# Patient Record
Sex: Female | Born: 1945 | Race: White | Hispanic: No | Marital: Married | Smoking: Former smoker
Health system: Southern US, Community
[De-identification: ages and names within clinical notes are randomized; demographics above are authoritative.]

## PROBLEM LIST (undated history)

## (undated) DIAGNOSIS — T8859XA Other complications of anesthesia, initial encounter: Secondary | ICD-10-CM

## (undated) DIAGNOSIS — F419 Anxiety disorder, unspecified: Secondary | ICD-10-CM

## (undated) DIAGNOSIS — M1990 Unspecified osteoarthritis, unspecified site: Secondary | ICD-10-CM

## (undated) DIAGNOSIS — G4700 Insomnia, unspecified: Secondary | ICD-10-CM

## (undated) DIAGNOSIS — R55 Syncope and collapse: Secondary | ICD-10-CM

## (undated) DIAGNOSIS — Z87442 Personal history of urinary calculi: Secondary | ICD-10-CM

## (undated) DIAGNOSIS — R011 Cardiac murmur, unspecified: Secondary | ICD-10-CM

## (undated) DIAGNOSIS — I4891 Unspecified atrial fibrillation: Secondary | ICD-10-CM

## (undated) DIAGNOSIS — G510 Bell's palsy: Secondary | ICD-10-CM

## (undated) DIAGNOSIS — T4145XA Adverse effect of unspecified anesthetic, initial encounter: Secondary | ICD-10-CM

## (undated) DIAGNOSIS — IMO0001 Reserved for inherently not codable concepts without codable children: Secondary | ICD-10-CM

## (undated) DIAGNOSIS — I10 Essential (primary) hypertension: Secondary | ICD-10-CM

## (undated) DIAGNOSIS — I422 Other hypertrophic cardiomyopathy: Secondary | ICD-10-CM

## (undated) DIAGNOSIS — K219 Gastro-esophageal reflux disease without esophagitis: Secondary | ICD-10-CM

## (undated) DIAGNOSIS — I739 Peripheral vascular disease, unspecified: Secondary | ICD-10-CM

## (undated) DIAGNOSIS — I700 Atherosclerosis of aorta: Secondary | ICD-10-CM

## (undated) DIAGNOSIS — M8580 Other specified disorders of bone density and structure, unspecified site: Secondary | ICD-10-CM

## (undated) HISTORY — DX: Unspecified atrial fibrillation: I48.91

## (undated) HISTORY — PX: TONSILLECTOMY: SUR1361

## (undated) HISTORY — DX: Reserved for inherently not codable concepts without codable children: IMO0001

## (undated) HISTORY — DX: Peripheral vascular disease, unspecified: I73.9

## (undated) HISTORY — DX: Atherosclerosis of aorta: I70.0

## (undated) HISTORY — PX: BACK SURGERY: SHX140

## (undated) HISTORY — DX: Insomnia, unspecified: G47.00

## (undated) HISTORY — DX: Syncope and collapse: R55

## (undated) HISTORY — DX: Other specified disorders of bone density and structure, unspecified site: M85.80

## (undated) HISTORY — PX: OVARIAN CYST REMOVAL: SHX89

## (undated) HISTORY — PX: DILATION AND CURETTAGE OF UTERUS: SHX78

## (undated) HISTORY — PX: AUGMENTATION MAMMAPLASTY: SUR837

---

## 1998-08-09 ENCOUNTER — Other Ambulatory Visit: Admission: RE | Admit: 1998-08-09 | Discharge: 1998-08-09 | Payer: Self-pay | Admitting: Gynecology

## 1999-08-28 ENCOUNTER — Other Ambulatory Visit: Admission: RE | Admit: 1999-08-28 | Discharge: 1999-08-28 | Payer: Self-pay | Admitting: Gynecology

## 2000-04-15 HISTORY — PX: FACIAL COSMETIC SURGERY: SHX629

## 2000-04-15 HISTORY — PX: INGUINAL HERNIA REPAIR: SUR1180

## 2000-08-27 ENCOUNTER — Other Ambulatory Visit: Admission: RE | Admit: 2000-08-27 | Discharge: 2000-08-27 | Payer: Self-pay | Admitting: Gynecology

## 2000-11-21 ENCOUNTER — Encounter: Payer: Self-pay | Admitting: Internal Medicine

## 2000-11-21 ENCOUNTER — Encounter: Admission: RE | Admit: 2000-11-21 | Discharge: 2000-11-21 | Payer: Self-pay | Admitting: Internal Medicine

## 2000-12-02 ENCOUNTER — Encounter: Admission: RE | Admit: 2000-12-02 | Discharge: 2000-12-02 | Payer: Self-pay | Admitting: Urology

## 2000-12-02 ENCOUNTER — Encounter: Payer: Self-pay | Admitting: Urology

## 2000-12-17 ENCOUNTER — Encounter: Admission: RE | Admit: 2000-12-17 | Discharge: 2000-12-17 | Payer: Self-pay | Admitting: General Surgery

## 2000-12-17 ENCOUNTER — Encounter: Payer: Self-pay | Admitting: General Surgery

## 2000-12-18 ENCOUNTER — Ambulatory Visit (HOSPITAL_BASED_OUTPATIENT_CLINIC_OR_DEPARTMENT_OTHER): Admission: RE | Admit: 2000-12-18 | Discharge: 2000-12-18 | Payer: Self-pay | Admitting: General Surgery

## 2001-09-10 ENCOUNTER — Other Ambulatory Visit: Admission: RE | Admit: 2001-09-10 | Discharge: 2001-09-10 | Payer: Self-pay | Admitting: Gynecology

## 2002-06-05 ENCOUNTER — Emergency Department (HOSPITAL_COMMUNITY): Admission: EM | Admit: 2002-06-05 | Discharge: 2002-06-05 | Payer: Self-pay | Admitting: Emergency Medicine

## 2002-12-15 ENCOUNTER — Other Ambulatory Visit: Admission: RE | Admit: 2002-12-15 | Discharge: 2002-12-15 | Payer: Self-pay | Admitting: Gynecology

## 2003-10-29 ENCOUNTER — Emergency Department (HOSPITAL_COMMUNITY): Admission: EM | Admit: 2003-10-29 | Discharge: 2003-10-29 | Payer: Self-pay | Admitting: Emergency Medicine

## 2007-08-13 ENCOUNTER — Other Ambulatory Visit: Admission: RE | Admit: 2007-08-13 | Discharge: 2007-08-13 | Payer: Self-pay | Admitting: Obstetrics and Gynecology

## 2009-10-30 ENCOUNTER — Other Ambulatory Visit: Admission: RE | Admit: 2009-10-30 | Discharge: 2009-10-30 | Payer: Self-pay | Admitting: Internal Medicine

## 2010-08-31 NOTE — Op Note (Signed)
[place]  Patient:    Beverly Li, Beverly Li Visit Number: [id number] MRN: [id number]          Service Type: DSU Location: [facility] Attending Physician:  Arlis Porta Dictated by:   Adolph Pollack, M.D. Admit Date:  12/18/2000   CC:         Beverly Hair. Vaughan Basta., M.D.   Operative Report  PREOPERATIVE DIAGNOSIS:  Bilateral inguinal hernias.  POSTOPERATIVE DIAGNOSIS:  Bilateral indirect inguinal hernias.  OPERATION PERFORMED:  Laparoscopic repair of bilateral indirect inguinal hernias with mesh (TAPP).  SURGEON:  Adolph Pollack, M.D.  ANESTHESIA:  General.  INDICATIONS FOR PROCEDURE:  Beverly Li is a 65 year old female who has been noticing growing bulges in her groin, left greater than right with some symptomatology.  These are especially noted when she does abdominal muscle exercises.  She now presents for elective repair of her bilateral inguinal hernias.  She has had a lower transverse incision in the past.  DESCRIPTION OF PROCEDURE:  She was placed supine on the operating table and general anesthetic was administered.  A Foley catheter was placed in the bladder.  Her abdomen was then sterilely prepped and draped.  A local anesthetic consisting of dilute Marcaine was infiltrated in the subumbilical region and a transverse incision made in the subumbilical region.  The midline was identified.  I went to the left of the midline to the anterior rectus sheath and made an incision in the anterior rectus sheath.  I attempted dissect the rectus muscle laterally and identify the posterior rectus sheath but it was fused to the peritoneum and I entered the peritoneal cavity.  I approached the right side in a similar fashion, made a small incision in the right anterior rectus sheath but again met with the same fusion which led me to believe that despite the lower transverse incision, a midline fascial incision had been made.  I  subsequently went ahead and inserted a trocar into the peritoneal cavity and a pneumoperitoneum was created by insufflation of CO2 gas.  I then placed her in the Trendelenburg position.  Two small incisions were made in the midline and 5 mm trocars placed through them.  I then noted the hernias and noted the peritoneal defects.  I subsequently dissected the peritoneum down and exposed the extraperitoneal space on the left side.  I also exposed Coopers ligament in this fashion.  I noted that this was an indirect hernia and I removed the sac.  I subsequently identified the round ligament and clipped it and divided it.  I then dissected the peritoneum free from the anterior and lateral abdominal walls out to the level of the anterior superior iliac spine.  Next, in a similar fashion, I approached the right side, dissected the peritoneum now free from what was an indirect hernia.  Coopers ligament had been exposed.  I subsequently identified the hernia sac and brought it back to the level of the anterior superior iliac spine.  I clipped the round ligament and divided it.  Next, I placed a piece of 4 inch x  6 inch mesh into the left inguinal space.  There was a filmy type device to be laid against the viscera if it were to contact it.  I then anchored the mesh inferomedially to the Coopers ligament, then laterally to the lateral abdominal wall the level of the anterior superior iliac spine, anteriorly to the anterior abdominal wall with spiral tacks.  This adequately covered  the direct, indirect and femoral spaces.  Next, I placed a piece of 4 x 6 inch mesh of the same type into the right inguinal area and anchored it inferomedially to Coopers ligament, laterally to the abdominal wall and anterior to the abdominal wall with the spiral tacks.  Likewise, this covered the direct, indirect and femoral spaces adequately.  I subsequently was able to reapproximate the peritoneum on the left side  by clipping it together.  No significant peritoneal defect was made on the right side, so the mesh essentially remained extraperitoneal without contact into any intraperitoneal contents.  Subsequently, I released the CO2 gas from the extra and intraperitoneal areas.  The trocars were removed.  I repaired both the right and left anterior rectus sheath defects with interrupted Vicryl sutures.  The skin incisions were then closed with 4-0 Monocryl subcuticular stitches followed by Steri-Strips and sterile dressings.  The patient tolerated the procedure well without any apparent complications and was taken to the recovery room in satisfactory condition. Dictated by:   Adolph Pollack, M.D. Attending Physician:  Arlis Porta DD:  12/18/00 TD:  12/18/00 Job: [id number]

## 2010-11-22 ENCOUNTER — Other Ambulatory Visit: Payer: Self-pay | Admitting: Internal Medicine

## 2010-11-22 ENCOUNTER — Ambulatory Visit
Admission: RE | Admit: 2010-11-22 | Discharge: 2010-11-22 | Disposition: A | Discharge status: Home / Self Care | Payer: 59 | Source: Ambulatory Visit | Attending: Internal Medicine | Admitting: Internal Medicine

## 2010-11-22 DIAGNOSIS — E041 Nontoxic single thyroid nodule: Secondary | ICD-10-CM

## 2010-11-23 ENCOUNTER — Other Ambulatory Visit: Payer: Self-pay

## 2010-11-28 ENCOUNTER — Other Ambulatory Visit: Payer: Self-pay | Admitting: Internal Medicine

## 2010-11-28 DIAGNOSIS — E041 Nontoxic single thyroid nodule: Secondary | ICD-10-CM

## 2010-12-12 ENCOUNTER — Ambulatory Visit
Admission: RE | Admit: 2010-12-12 | Discharge: 2010-12-12 | Disposition: A | Discharge status: Home / Self Care | Payer: 59 | Source: Ambulatory Visit | Attending: Internal Medicine | Admitting: Internal Medicine

## 2010-12-12 ENCOUNTER — Other Ambulatory Visit (HOSPITAL_COMMUNITY)
Admission: RE | Admit: 2010-12-12 | Discharge: 2010-12-12 | Disposition: A | Discharge status: Home / Self Care | Payer: 59 | Source: Ambulatory Visit | Attending: Interventional Radiology | Admitting: Interventional Radiology

## 2010-12-12 DIAGNOSIS — E049 Nontoxic goiter, unspecified: Secondary | ICD-10-CM | POA: Insufficient documentation

## 2010-12-12 DIAGNOSIS — E041 Nontoxic single thyroid nodule: Secondary | ICD-10-CM

## 2011-11-28 ENCOUNTER — Other Ambulatory Visit: Payer: Self-pay | Admitting: Internal Medicine

## 2011-11-28 ENCOUNTER — Other Ambulatory Visit (HOSPITAL_COMMUNITY)
Admission: RE | Admit: 2011-11-28 | Discharge: 2011-11-28 | Disposition: A | Discharge status: Home / Self Care | Payer: 59 | Source: Ambulatory Visit | Attending: Internal Medicine | Admitting: Internal Medicine

## 2011-11-28 DIAGNOSIS — Z01419 Encounter for gynecological examination (general) (routine) without abnormal findings: Secondary | ICD-10-CM | POA: Insufficient documentation

## 2012-09-19 ENCOUNTER — Emergency Department (HOSPITAL_COMMUNITY)
Admission: EM | Admit: 2012-09-19 | Discharge: 2012-09-20 | Disposition: A | Discharge status: Home / Self Care | Payer: 59 | Attending: Emergency Medicine | Admitting: Emergency Medicine

## 2012-09-19 ENCOUNTER — Emergency Department (HOSPITAL_COMMUNITY): Payer: 59

## 2012-09-19 ENCOUNTER — Encounter (HOSPITAL_COMMUNITY): Payer: Self-pay | Admitting: *Deleted

## 2012-09-19 DIAGNOSIS — F1010 Alcohol abuse, uncomplicated: Secondary | ICD-10-CM | POA: Insufficient documentation

## 2012-09-19 DIAGNOSIS — R1013 Epigastric pain: Secondary | ICD-10-CM | POA: Insufficient documentation

## 2012-09-19 DIAGNOSIS — R002 Palpitations: Secondary | ICD-10-CM | POA: Insufficient documentation

## 2012-09-19 DIAGNOSIS — R079 Chest pain, unspecified: Secondary | ICD-10-CM | POA: Insufficient documentation

## 2012-09-19 HISTORY — DX: Essential (primary) hypertension: I10

## 2012-09-19 LAB — BASIC METABOLIC PANEL
CO2: 30 mEq/L (ref 19–32)
Chloride: 104 mEq/L (ref 96–112)
GFR calc Af Amer: 79 mL/min — ABNORMAL LOW (ref >90)
Sodium: 142 mEq/L (ref 135–145)

## 2012-09-19 LAB — CBC
Platelets: 204 10*3/uL (ref 150–400)
RBC: 5.64 MIL/uL — ABNORMAL HIGH (ref 3.87–5.11)
RDW: 13.9 % (ref 11.5–15.5)
WBC: 15.4 10*3/uL — ABNORMAL HIGH (ref 4.0–10.5)

## 2012-09-19 LAB — POCT I-STAT TROPONIN I: Troponin i, poc: 0.01 ng/mL (ref 0.00–0.08)

## 2012-09-19 NOTE — ED Notes (Signed)
Pt states indigestion x 2 days. Was at party today and experienced, pounding pulse, nausea, vomiting diaphoresis, and burning CP.

## 2012-09-19 NOTE — ED Provider Notes (Signed)
History     CSN: [id number]  Arrival date & time 09/19/12  2225   First MD Initiated Contact with Patient 09/19/12 2313      Chief Complaint  Patient presents with  . Chest Pain    (Consider location/radiation/quality/duration/timing/severity/associated sxs/prior treatment) HPI Comments: Patient presents with "burning in her chest" that started earlier this evening.  She was at a party and had a few hors de huerves and one mixed drink prior to the onset of the symptoms.  She also reports having palpitations, pounding in her chest during the episode.  She also reports pain in her right arm that has occurred intermittently for the past two days.    Patient is a 67 y.o. female presenting with chest pain. The history is provided by the patient.  Chest Pain Pain location:  Epigastric and substernal area Pain quality: burning   Pain radiates to:  Does not radiate Pain radiates to the back: no   Pain severity:  Moderate Onset quality:  Gradual Timing:  Constant Progression:  Resolved Chronicity:  New Relieved by:  Nothing Worsened by:  Nothing tried Ineffective treatments:  None tried   No past medical history on file.  No past surgical history on file.  No family history on file.  History  Substance Use Topics  . Smoking status: Not on file  . Smokeless tobacco: Not on file  . Alcohol Use: Not on file    OB History   No data available      Review of Systems  Cardiovascular: Positive for chest pain.  All other systems reviewed and are negative.    Allergies  Review of patient's allergies indicates not on file.  Home Medications  No current outpatient prescriptions on file.  BP 164/86  Pulse 70  Temp(Src) 98.5 F (36.9 C) (Oral)  Resp 18  SpO2 98%  Physical Exam  Nursing note and vitals reviewed. Constitutional: She is oriented to person, place, and time. She appears well-developed and well-nourished. No distress.  HENT:  Head: Normocephalic and  atraumatic.  Mouth/Throat: Oropharynx is clear and moist.  Neck: Normal range of motion. Neck supple.  Cardiovascular: Normal rate, regular rhythm and normal heart sounds.   No murmur heard. Pulmonary/Chest: Effort normal and breath sounds normal. No respiratory distress. She has no wheezes. She has no rales.  Abdominal: Soft. Bowel sounds are normal. She exhibits no distension. There is no tenderness.  Musculoskeletal: Normal range of motion. She exhibits no edema.  Lymphadenopathy:    She has no cervical adenopathy.  Neurological: She is alert and oriented to person, place, and time.  Skin: Skin is warm and dry. She is not diaphoretic.    ED Course  Procedures (including critical care time)  Labs Reviewed  CBC - Abnormal; Notable for the following:    WBC 15.4 (*)    RBC 5.64 (*)    Hemoglobin 18.4 (*)    HCT 51.7 (*)    All other components within normal limits  BASIC METABOLIC PANEL - Abnormal; Notable for the following:    BUN 30 (*)    GFR calc non Af Amer 68 (*)    GFR calc Af Amer 79 (*)    All other components within normal limits  POCT I-STAT TROPONIN I   No results found.   No diagnosis found.   Date: 09/19/2012  Rate: 69  Rhythm: normal sinus rhythm  QRS Axis: left  Intervals: normal  ST/T Wave abnormalities: nonspecific T wave changes  Conduction Disutrbances:none  Narrative Interpretation:   Old EKG Reviewed: unchanged    MDM  The ekg shows ST segment inversions in the lateral leads that are concerning, but troponin is negative.  With no prior ekgs for comparison, I recommended admission for observation.  I have discussed this with the patient, however she does not want to stay.  She tells me she will call her cardiologist on Monday to arrange a follow up.  I have advised her of the risks of discharge including death and disability and she is willing to accept them.  She will return if her symptoms worsen or change.         Geoffery Lyons,  MD 09/20/12 [phone]

## 2012-09-20 NOTE — ED Notes (Signed)
Pt. Aware of risks and benefits of IV for cardiac care.

## 2012-09-20 NOTE — ED Notes (Signed)
2320: pt. Refused piv. Dr Judd Lien made aware.

## 2012-12-08 ENCOUNTER — Other Ambulatory Visit: Payer: Self-pay | Admitting: Family Medicine

## 2012-12-08 DIAGNOSIS — M545 Low back pain, unspecified: Secondary | ICD-10-CM

## 2012-12-17 ENCOUNTER — Ambulatory Visit
Admission: RE | Admit: 2012-12-17 | Discharge: 2012-12-17 | Disposition: A | Discharge status: Home / Self Care | Payer: 59 | Source: Ambulatory Visit | Attending: Family Medicine | Admitting: Family Medicine

## 2012-12-17 DIAGNOSIS — M545 Low back pain: Secondary | ICD-10-CM

## 2013-04-15 HISTORY — PX: POSTERIOR LUMBAR FUSION: SHX6036

## 2013-05-04 ENCOUNTER — Encounter: Payer: Self-pay | Admitting: Cardiology

## 2013-05-04 ENCOUNTER — Ambulatory Visit (INDEPENDENT_AMBULATORY_CARE_PROVIDER_SITE_OTHER): Payer: 59 | Admitting: Cardiology

## 2013-05-04 ENCOUNTER — Encounter (INDEPENDENT_AMBULATORY_CARE_PROVIDER_SITE_OTHER): Payer: Self-pay

## 2013-05-04 VITALS — BP 180/80 | HR 56 | Ht 65.0 in | Wt 127.0 lb

## 2013-05-04 DIAGNOSIS — I517 Cardiomegaly: Secondary | ICD-10-CM | POA: Insufficient documentation

## 2013-05-04 DIAGNOSIS — I700 Atherosclerosis of aorta: Secondary | ICD-10-CM

## 2013-05-04 DIAGNOSIS — I498 Other specified cardiac arrhythmias: Secondary | ICD-10-CM

## 2013-05-04 DIAGNOSIS — I10 Essential (primary) hypertension: Secondary | ICD-10-CM | POA: Insufficient documentation

## 2013-05-04 DIAGNOSIS — I499 Cardiac arrhythmia, unspecified: Secondary | ICD-10-CM

## 2013-05-04 DIAGNOSIS — I421 Obstructive hypertrophic cardiomyopathy: Secondary | ICD-10-CM

## 2013-05-04 DIAGNOSIS — R011 Cardiac murmur, unspecified: Secondary | ICD-10-CM | POA: Insufficient documentation

## 2013-05-04 DIAGNOSIS — R55 Syncope and collapse: Secondary | ICD-10-CM | POA: Insufficient documentation

## 2013-05-04 DIAGNOSIS — IMO0001 Reserved for inherently not codable concepts without codable children: Secondary | ICD-10-CM | POA: Insufficient documentation

## 2013-05-04 DIAGNOSIS — R9431 Abnormal electrocardiogram [ECG] [EKG]: Secondary | ICD-10-CM

## 2013-05-04 NOTE — Progress Notes (Signed)
[address] [place], [place]  [id number] Phone: [phone] Fax:  [phone]  Date:  05/04/2013   ID:  Beverly OrleansFrances B Li, DOB [birth year], MRN [id number]  PCP:  Beverley FiedlerANKINS,VICTORIA, MD   History of Present Illness: Beverly MeekerFrances B Li is a 68 y.o. female with hypertrophic cardiomyopathy. Previous event monitor on 09/22/12 demonstrated no ventricular tachycardia, no adverse arrhythmias. No SVT. In the past, she has had a stress test which was overall low risk with no arrhythmias. A prior monitor did show a brief run of NSVT in the past. Last monitor caused blistering.   On 09/19/12 she did go to the emergency room with chest burning. EKG was performed which showed ST segment changes especially in the lateral leads. An old one was not able to be compared in the emergency department but this is no change from prior. This was secondary to hypertrophic cardio myopathy. She did not wish to stay in the hospital for observation. Her troponin was negative. GERD. Nexium "has saved her life".   Having back pain. No exercise. Recent injection.   Wt Readings from Last 3 Encounters:  05/04/13 127 lb (57.607 kg)     Past Medical History  Diagnosis Date  . Atrial arrhythmia   . Hypertension   . Insomnia   . Systolic murmur   . Atrial septal hypertrophy     , proximal, without significant gradient  . Mild aortic sclerosis   . Osteopenia   . Syncope     , Vagal  . Diverticulosis     Past Surgical History  Procedure Laterality Date  . Hernia repair    . Breast enhancement surgery Bilateral   . Inguinal hernia repair Bilateral   . Facial cosmetic surgery    . Left ovarian cyst      Current Outpatient Prescriptions  Medication Sig Dispense Refill  . b complex vitamins tablet Take 1 tablet by mouth at bedtime.      . calcium carbonate (TUMS - DOSED IN MG ELEMENTAL CALCIUM) 500 MG chewable tablet Chew 1 tablet by mouth as needed for heartburn.       . esomeprazole (NEXIUM) 40 MG  capsule Take 40 mg by mouth daily at 12 noon.      [name]. losartan (COZAAR) 50 MG tablet Take 50 mg by mouth daily.       . Multiple Vitamin (MULTIVITAMIN WITH MINERALS) TABS Take 1 tablet by mouth at bedtime.      . nabumetone (RELAFEN) 500 MG tablet Take 500 mg by mouth 2 (two) times daily.      . nebivolol (BYSTOLIC) 10 MG tablet Take 10 mg by mouth at bedtime.      . traMADol (ULTRAM) 50 MG tablet Take 50 mg by mouth every 6 (six) hours as needed.      . zolpidem (AMBIEN) 10 MG tablet Take 10 mg by mouth at bedtime as needed for sleep.       No current facility-administered medications for this visit.    Allergies:    Allergies  Allergen Reactions  . Atenolol Cough  . Sulfa Antibiotics Nausea Only  . Codeine Rash    Social History:  The patient  reports that she quit smoking about 2 weeks ago. She does not have any smokeless tobacco history on file. She reports that she drinks alcohol. She reports that she does not use illicit drugs.   ROS:  Please see the history of present illness.  Denies any fevers, chills, orthopnea, PND    PHYSICAL EXAM: VS:  BP 180/80  Pulse 56  Ht 5\' 5"  (1.651 m)  Wt 127 lb (57.607 kg)  BMI 21.13 kg/m2 Well nourished, well developed, in no acute distress HEENT: normal Neck: no JVD Cardiac:  normal S1, S2; RRR; 2/6 systolic murmur Lungs:  clear to auscultation bilaterally, no wheezing, rhonchi or rales Abd: soft, nontender, no hepatomegaly Ext: no edema Skin: warm and dry Neuro: no focal abnormalities noted  EKG:  05/04/13: Sinus bradycardia rate 56 with LVH pattern, repolarization abnormality, T wave inversion in lateral leads.    No change from EKG on 7/14. ASSESSMENT AND PLAN:  1. Hypertension-elevated today. Just started back on losartan 3 days ago. She has been increases to 100. She will be seeing Dr. Barbaraann Barthel very soon. Important to continue to treat this aggressively. 2. Hypertrophic cardiomyopathy-continue with beta blocker. Murmur unchanged.  No high-risk symptoms such as syncope. Previous monitor with no ventricular tachycardia. EKG unchanged. EKG is abnormal because of hypertrophic pattern. 3. Abnormal EKG-secondary to hypertrophic cardio myopathy. 4. Chest pain-secondary to GERD. Nexium has helped significantly.  Signed, Donato Schultz, MD [facility]  05/04/2013 4:31 PM

## 2013-05-04 NOTE — Patient Instructions (Signed)
Your physician recommends that you continue on your current medications as directed. Please refer to the Current Medication list given to you today.  Your physician wants you to follow-up in: 6 months with Dr. Skains. You will receive a reminder letter in the mail two months in advance. If you don't receive a letter, please call our office to schedule the follow-up appointment.  

## 2013-05-19 ENCOUNTER — Telehealth: Payer: Self-pay | Admitting: Cardiology

## 2013-05-19 NOTE — Telephone Encounter (Signed)
New message     Dr Luciana Axeankin want to talk to Dr Caro HightSkain whenever he returns to the office

## 2013-05-21 NOTE — Telephone Encounter (Signed)
Spoke with Dr. Barbaraann Barthelankins 05/20/13. We decided to amlodipine 5 mg to her regimen. 10 mg of still blood pressures not under control. Next up would be to start spironolactone 25 mg once a day. We could consider renal duplex in future.

## 2013-05-21 NOTE — Telephone Encounter (Signed)
Dr. Barbaraann Barthelankins would like to talk with you regarding patient.

## 2013-05-25 NOTE — Telephone Encounter (Signed)
Medication change ,added Amlodipine

## 2013-06-21 ENCOUNTER — Other Ambulatory Visit: Payer: Self-pay | Admitting: Neurosurgery

## 2013-06-23 ENCOUNTER — Encounter (HOSPITAL_COMMUNITY): Payer: Self-pay

## 2013-06-29 NOTE — Pre-Procedure Instructions (Addendum)
GAVRIELLE STRECK  06/29/2013   Your procedure is scheduled on:  07/07/13  Report to [facility] short stay admitting at 630 AM.  Call this number if you have problems the morning of surgery: [phone]   Remember:   Do not eat food or drink liquids after midnight.   Take these medicines the morning of surgery with A SIP OF WATER: amlodipine,nexium, pain med if needed          STOP all herbel meds, nsaids (aleve,naproxen,advil,ibuprofen) 5 days prior to surgery including vitamins, aspirin   Do not wear jewelry, make-up or nail polish.  Do not wear lotions, powders, or perfumes. You may wear deodorant.  Do not shave 48 hours prior to surgery. Men may shave face and neck.  Do not bring valuables to the hospital.  [facility] is not responsible                  for any belongings or valuables.               Contacts, dentures or bridgework may not be worn into surgery.  Leave suitcase in the car. After surgery it may be brought to your room.  For patients admitted to the hospital, discharge time is determined by your                treatment team.               Patients discharged the day of surgery will not be allowed to drive  home.  Name and phone number of your driver:   Special Instructions:  Special Instructions: [facility] - Preparing for Surgery  Before surgery, you can play an important role.  Because skin is not sterile, your skin needs to be as free of germs as possible.  You can reduce the number of germs on you skin by washing with CHG (chlorahexidine gluconate) soap before surgery.  CHG is an antiseptic cleaner which kills germs and bonds with the skin to continue killing germs even after washing.  Please DO NOT use if you have an allergy to CHG or antibacterial soaps.  If your skin becomes reddened/irritated stop using the CHG and inform your nurse when you arrive at Short Stay.  Do not shave (including legs and underarms) for at least 48 hours prior to the first CHG shower.   You may shave your face.  Please follow these instructions carefully:   1.  Shower with CHG Soap the night before surgery and the morning of Surgery.  2.  If you choose to wash your hair, wash your hair first as usual with your normal shampoo.  3.  After you shampoo, rinse your hair and body thoroughly to remove the Shampoo.  4.  Use CHG as you would any other liquid soap.  You can apply chg directly  to the skin and wash gently with scrungie or a clean washcloth.  5.  Apply the CHG Soap to your body ONLY FROM THE NECK DOWN.  Do not use on open wounds or open sores.  Avoid contact with your eyes ears, mouth and genitals (private parts).  Wash genitals (private parts)       with your normal soap.  6.  Wash thoroughly, paying special attention to the area where your surgery will be performed.  7.  Thoroughly rinse your body with warm water from the neck down.  8.  DO NOT shower/wash with your normal soap after using and rinsing  off the CHG Soap.  9.  Pat yourself dry with a clean towel.            10.  Wear clean pajamas.            11.  Place clean sheets on your bed the night of your first shower and do not sleep with pets.  Day of Surgery  Do not apply any lotions/deodorants the morning of surgery.  Please wear clean clothes to the hospital/surgery center.   Please read over the following fact sheets that you were given: Pain Booklet, Coughing and Deep Breathing, Blood Transfusion Information, MRSA Information and Surgical Site Infection Prevention

## 2013-06-30 ENCOUNTER — Encounter (HOSPITAL_COMMUNITY): Payer: Self-pay

## 2013-06-30 ENCOUNTER — Encounter (HOSPITAL_COMMUNITY)
Admission: RE | Admit: 2013-06-30 | Discharge: 2013-06-30 | Disposition: A | Discharge status: Home / Self Care | Payer: 59 | Source: Ambulatory Visit | Attending: Neurosurgery | Admitting: Neurosurgery

## 2013-06-30 DIAGNOSIS — Z01812 Encounter for preprocedural laboratory examination: Secondary | ICD-10-CM | POA: Insufficient documentation

## 2013-06-30 HISTORY — DX: Personal history of urinary calculi: Z87.442

## 2013-06-30 HISTORY — DX: Gastro-esophageal reflux disease without esophagitis: K21.9

## 2013-06-30 HISTORY — DX: Other hypertrophic cardiomyopathy: I42.2

## 2013-06-30 LAB — SURGICAL PCR SCREEN
MRSA, PCR: NEGATIVE
STAPHYLOCOCCUS AUREUS: NEGATIVE

## 2013-06-30 LAB — BASIC METABOLIC PANEL
BUN: 30 mg/dL — ABNORMAL HIGH (ref 6–23)
CALCIUM: 9.9 mg/dL (ref 8.4–10.5)
CO2: 27 mEq/L (ref 19–32)
Chloride: 103 mEq/L (ref 96–112)
Creatinine, Ser: 0.68 mg/dL (ref 0.50–1.10)
GFR calc Af Amer: >90 mL/min (ref >90)
GFR calc non Af Amer: 88 mL/min — ABNORMAL LOW (ref >90)
GLUCOSE: 99 mg/dL (ref 70–99)
Potassium: 4.1 mEq/L (ref 3.7–5.3)
SODIUM: 142 meq/L (ref 137–147)

## 2013-06-30 LAB — CBC
HCT: 49.8 % — ABNORMAL HIGH (ref 36.0–46.0)
Hemoglobin: 17.5 g/dL — ABNORMAL HIGH (ref 12.0–15.0)
MCH: 32.1 pg (ref 26.0–34.0)
MCHC: 35.1 g/dL (ref 30.0–36.0)
MCV: 91.4 fL (ref 78.0–100.0)
Platelets: 229 10*3/uL (ref 150–400)
RBC: 5.45 MIL/uL — AB (ref 3.87–5.11)
RDW: 13.3 % (ref 11.5–15.5)
WBC: 8.9 10*3/uL (ref 4.0–10.5)

## 2013-06-30 LAB — TYPE AND SCREEN
ABO/RH(D): A NEG
Antibody Screen: NEGATIVE

## 2013-06-30 LAB — ABO/RH: ABO/RH(D): A NEG

## 2013-06-30 NOTE — Progress Notes (Addendum)
req'd clearance note from dr Anne Fuskains for surgery

## 2013-07-01 ENCOUNTER — Encounter (HOSPITAL_COMMUNITY): Payer: Self-pay

## 2013-07-01 NOTE — Progress Notes (Signed)
Anesthesia Chart Review:  Patient is a 68 year old female scheduled for L4, L5 laminectomies adn facetectomies with L4-5 PLIF on 07/07/2013 by Dr. Newell CoralNudelman.  History includes former smoker, hypertrophic cardiomyopathy, atrial septal hypertrophy, mild aortic sclerosis '06, dysrhythmia (51 PACs, 4 beat PAT, 15 PVCs, 5 beast NSVT, 1 triplet by 48 hour Holter monitor 08/2011), vagal syncope, hypertension, GERD, nephrolithiasis, diverticulosis, insomnia, osteopenia, breast augmentation, facial cosmetic surgery, tonsillectomy, bilateral inguinal hernia repair. PCP is listed as Dr. Beverley FiedlerVictoria Rankins. Cardiologist is Dr. Donato SchultzMark Skains, last visit 05/04/13. BP was 143/85 at PAT.  Meds: MVI, amlodipine, esomeprazole, losartan, nebivolol, tramadol, zolpidem.  EKG on 05/04/13 showed SB @ 56 bpm, biatrial enlargement, inferior infarct (age undetermined), anterior infarct (age undetermined), ST/T wave abnormality, consider lateral ischemia. EKG appears stable when compared to 11/22/10 EKG.  Holter monitor from 09/22/12 - 10/13/12 showed no adverse arrhythmias.  Echo on 05/15/10 showed: Severe left ventricular basilar hypertrophy with no evidence of significant outflow tract obstruction. There were no regional wall motion abnormalities. LVEF estimated at 65-70%. Findings consistent with hypertrophic cardiomyopathy. Mild left atrial enlargement. No systolic anterior motion of the mitral valve.  Mild tricuspid regurgitation. Analysis of mitral valve inflow, pulmonary vein Doppler and tissue Doppler suggests grade 1a diastolic dysfunction with elevated left atrial pressure. No AR or AS. Mild TR. Trace PR.  Nuclear stress test on 05/31/2010 showed low risk nuclear stress test. Hypertrophic cardiomyopathy, no adverse arrhythmias during exercise, normal ejection fraction of 62%, no evidence of ischemia.  CXR on 09/19/12 showed: No evidence of acute cardiopulmonary disease. Mild cardiomegaly and COPD/emphysema.  Preoperative labs  noted.  Chart reviewed with anesthesiologist Dr. Jean RosenthalJackson. Patient has had recent cardiology follow-up and had no significant outflow tract obstruction on her last echo.  If no acute changes then it is anticipated that she can proceed as planned.  Velna Ochsllison Charletta Voight, PA-C [facility] Phone [phone] 07/01/2013 4:49 PM

## 2013-07-06 MED ORDER — CEFAZOLIN SODIUM-DEXTROSE 2-3 GM-% IV SOLR
2.0000 g | INTRAVENOUS | Status: AC
  Administered 2013-07-07: 2 g via INTRAVENOUS
  Filled 2013-07-06: qty 50

## 2013-07-07 ENCOUNTER — Encounter (HOSPITAL_COMMUNITY)
Admission: RE | Disposition: A | Discharge status: Home / Self Care | Payer: 59 | Source: Ambulatory Visit | Attending: Neurosurgery

## 2013-07-07 ENCOUNTER — Encounter (HOSPITAL_COMMUNITY): Payer: 59 | Admitting: Vascular Surgery

## 2013-07-07 ENCOUNTER — Encounter (HOSPITAL_COMMUNITY): Payer: Self-pay | Admitting: *Deleted

## 2013-07-07 ENCOUNTER — Inpatient Hospital Stay (HOSPITAL_COMMUNITY): Payer: 59 | Admitting: Certified Registered"

## 2013-07-07 ENCOUNTER — Inpatient Hospital Stay (HOSPITAL_COMMUNITY): Payer: 59

## 2013-07-07 ENCOUNTER — Inpatient Hospital Stay (HOSPITAL_COMMUNITY)
Admission: RE | Admit: 2013-07-07 | Discharge: 2013-07-08 | DRG: 460 | Disposition: A | Discharge status: Home / Self Care | Payer: 59 | Source: Ambulatory Visit | Attending: Neurosurgery | Admitting: Neurosurgery

## 2013-07-07 DIAGNOSIS — I422 Other hypertrophic cardiomyopathy: Secondary | ICD-10-CM | POA: Diagnosis present

## 2013-07-07 DIAGNOSIS — I10 Essential (primary) hypertension: Secondary | ICD-10-CM | POA: Diagnosis present

## 2013-07-07 DIAGNOSIS — Z885 Allergy status to narcotic agent status: Secondary | ICD-10-CM

## 2013-07-07 DIAGNOSIS — Z79899 Other long term (current) drug therapy: Secondary | ICD-10-CM

## 2013-07-07 DIAGNOSIS — M48062 Spinal stenosis, lumbar region with neurogenic claudication: Principal | ICD-10-CM | POA: Diagnosis present

## 2013-07-07 DIAGNOSIS — R011 Cardiac murmur, unspecified: Secondary | ICD-10-CM | POA: Diagnosis present

## 2013-07-07 DIAGNOSIS — M4310 Spondylolisthesis, site unspecified: Secondary | ICD-10-CM | POA: Diagnosis present

## 2013-07-07 DIAGNOSIS — K219 Gastro-esophageal reflux disease without esophagitis: Secondary | ICD-10-CM | POA: Diagnosis present

## 2013-07-07 DIAGNOSIS — I700 Atherosclerosis of aorta: Secondary | ICD-10-CM | POA: Diagnosis present

## 2013-07-07 DIAGNOSIS — Z801 Family history of malignant neoplasm of trachea, bronchus and lung: Secondary | ICD-10-CM

## 2013-07-07 DIAGNOSIS — Z800 Family history of malignant neoplasm of digestive organs: Secondary | ICD-10-CM

## 2013-07-07 DIAGNOSIS — Z803 Family history of malignant neoplasm of breast: Secondary | ICD-10-CM

## 2013-07-07 DIAGNOSIS — Z87891 Personal history of nicotine dependence: Secondary | ICD-10-CM

## 2013-07-07 DIAGNOSIS — Z882 Allergy status to sulfonamides status: Secondary | ICD-10-CM

## 2013-07-07 SURGERY — POSTERIOR LUMBAR FUSION 1 LEVEL
Anesthesia: General | Site: Back | Laterality: Bilateral

## 2013-07-07 MED ORDER — DEXTROSE 5 % IV SOLN
10.0000 mg | INTRAVENOUS | Status: DC | PRN
  Administered 2013-07-07: 50 ug/min via INTRAVENOUS

## 2013-07-07 MED ORDER — PHENYLEPHRINE HCL 10 MG/ML IJ SOLN
INTRAMUSCULAR | Status: DC | PRN
  Administered 2013-07-07 (×3): 80 ug via INTRAVENOUS

## 2013-07-07 MED ORDER — LIDOCAINE HCL (CARDIAC) 20 MG/ML IV SOLN
INTRAVENOUS | Status: AC
Start: 2013-07-07 — End: 2013-07-07
  Filled 2013-07-07: qty 5

## 2013-07-07 MED ORDER — MORPHINE SULFATE 4 MG/ML IJ SOLN: 4.0000 mg | INTRAMUSCULAR | Status: DC | PRN

## 2013-07-07 MED ORDER — ROCURONIUM BROMIDE 50 MG/5ML IV SOLN
INTRAVENOUS | Status: AC
  Filled 2013-07-07: qty 1

## 2013-07-07 MED ORDER — MENTHOL 3 MG MT LOZG: 1.0000 | LOZENGE | OROMUCOSAL | Status: DC | PRN

## 2013-07-07 MED ORDER — NEBIVOLOL HCL 10 MG PO TABS
10.0000 mg | ORAL_TABLET | Freq: Every day | ORAL | Status: DC
  Administered 2013-07-07: 10 mg via ORAL
  Filled 2013-07-07 (×2): qty 1

## 2013-07-07 MED ORDER — HYDROXYZINE HCL 25 MG PO TABS: 50.0000 mg | ORAL_TABLET | ORAL | Status: DC | PRN

## 2013-07-07 MED ORDER — MAGNESIUM HYDROXIDE 400 MG/5ML PO SUSP: 30.0000 mL | Freq: Every day | ORAL | Status: DC | PRN

## 2013-07-07 MED ORDER — DEXAMETHASONE SODIUM PHOSPHATE 10 MG/ML IJ SOLN
INTRAMUSCULAR | Status: AC
  Filled 2013-07-07: qty 1

## 2013-07-07 MED ORDER — ONDANSETRON HCL 4 MG/2ML IJ SOLN: 4.0000 mg | Freq: Four times a day (QID) | INTRAMUSCULAR | Status: DC | PRN

## 2013-07-07 MED ORDER — FENTANYL CITRATE 0.05 MG/ML IJ SOLN
INTRAMUSCULAR | Status: AC
  Filled 2013-07-07: qty 5

## 2013-07-07 MED ORDER — BISACODYL 10 MG RE SUPP: 10.0000 mg | Freq: Every day | RECTAL | Status: DC | PRN

## 2013-07-07 MED ORDER — SODIUM CHLORIDE 0.9 % IJ SOLN: 3.0000 mL | INTRAMUSCULAR | Status: DC | PRN

## 2013-07-07 MED ORDER — ACETAMINOPHEN 10 MG/ML IV SOLN
INTRAVENOUS | Status: AC
  Filled 2013-07-07: qty 100

## 2013-07-07 MED ORDER — LACTATED RINGERS IV SOLN
INTRAVENOUS | Status: DC | PRN
  Administered 2013-07-07 (×3): via INTRAVENOUS

## 2013-07-07 MED ORDER — 0.9 % SODIUM CHLORIDE (POUR BTL) OPTIME
TOPICAL | Status: DC | PRN
  Administered 2013-07-07 (×2): 1000 mL

## 2013-07-07 MED ORDER — GLYCOPYRROLATE 0.2 MG/ML IJ SOLN
INTRAMUSCULAR | Status: DC | PRN
  Administered 2013-07-07: 0.2 mg via INTRAVENOUS
  Administered 2013-07-07: 0.4 mg via INTRAVENOUS

## 2013-07-07 MED ORDER — PROPOFOL 10 MG/ML IV BOLUS
INTRAVENOUS | Status: AC
  Filled 2013-07-07: qty 20

## 2013-07-07 MED ORDER — ACETAMINOPHEN 650 MG RE SUPP: 650.0000 mg | RECTAL | Status: DC | PRN

## 2013-07-07 MED ORDER — KCL IN DEXTROSE-NACL 20-5-0.45 MEQ/L-%-% IV SOLN
INTRAVENOUS | Status: DC
  Filled 2013-07-07 (×4): qty 1000

## 2013-07-07 MED ORDER — TRAMADOL HCL 50 MG PO TABS: 50.0000 mg | ORAL_TABLET | Freq: Four times a day (QID) | ORAL | Status: DC | PRN

## 2013-07-07 MED ORDER — THROMBIN 20000 UNITS EX SOLR
CUTANEOUS | Status: DC | PRN
  Administered 2013-07-07: 09:00:00 via TOPICAL

## 2013-07-07 MED ORDER — ONDANSETRON HCL 4 MG/2ML IJ SOLN
INTRAMUSCULAR | Status: DC | PRN
  Administered 2013-07-07: 4 mg via INTRAVENOUS

## 2013-07-07 MED ORDER — ACETAMINOPHEN 10 MG/ML IV SOLN
INTRAVENOUS | Status: DC | PRN
  Administered 2013-07-07: 1000 mg via INTRAVENOUS

## 2013-07-07 MED ORDER — MIDAZOLAM HCL 2 MG/2ML IJ SOLN
INTRAMUSCULAR | Status: AC
  Filled 2013-07-07: qty 2

## 2013-07-07 MED ORDER — BUPIVACAINE HCL (PF) 0.5 % IJ SOLN
INTRAMUSCULAR | Status: DC | PRN
  Administered 2013-07-07: 20 mL

## 2013-07-07 MED ORDER — AMLODIPINE BESYLATE 5 MG PO TABS
5.0000 mg | ORAL_TABLET | Freq: Every day | ORAL | Status: DC
  Filled 2013-07-07 (×2): qty 1

## 2013-07-07 MED ORDER — ALBUMIN HUMAN 5 % IV SOLN
INTRAVENOUS | Status: DC | PRN
  Administered 2013-07-07 (×2): via INTRAVENOUS

## 2013-07-07 MED ORDER — ZOLPIDEM TARTRATE 5 MG PO TABS: 5.0000 mg | ORAL_TABLET | Freq: Every evening | ORAL | Status: DC | PRN

## 2013-07-07 MED ORDER — LIDOCAINE HCL (CARDIAC) 20 MG/ML IV SOLN
INTRAVENOUS | Status: DC | PRN
  Administered 2013-07-07: 30 mg via INTRAVENOUS

## 2013-07-07 MED ORDER — ALUM & MAG HYDROXIDE-SIMETH 200-200-20 MG/5ML PO SUSP: 30.0000 mL | Freq: Four times a day (QID) | ORAL | Status: DC | PRN

## 2013-07-07 MED ORDER — SODIUM CHLORIDE 0.9 % IV SOLN: 250.0000 mL | INTRAVENOUS | Status: DC

## 2013-07-07 MED ORDER — MEPERIDINE HCL 25 MG/ML IJ SOLN: 6.2500 mg | INTRAMUSCULAR | Status: DC | PRN

## 2013-07-07 MED ORDER — KETOROLAC TROMETHAMINE 30 MG/ML IJ SOLN
30.0000 mg | Freq: Four times a day (QID) | INTRAMUSCULAR | Status: DC
  Administered 2013-07-07 – 2013-07-08 (×4): 30 mg via INTRAVENOUS
  Filled 2013-07-07 (×7): qty 1

## 2013-07-07 MED ORDER — NEOSTIGMINE METHYLSULFATE 1 MG/ML IJ SOLN
INTRAMUSCULAR | Status: DC | PRN
  Administered 2013-07-07: 3 mg via INTRAVENOUS

## 2013-07-07 MED ORDER — SODIUM CHLORIDE 0.9 % IR SOLN
Status: DC | PRN
  Administered 2013-07-07: 09:00:00

## 2013-07-07 MED ORDER — PANTOPRAZOLE SODIUM 40 MG PO TBEC
80.0000 mg | DELAYED_RELEASE_TABLET | Freq: Every day | ORAL | Status: DC
  Administered 2013-07-08: 80 mg via ORAL
  Filled 2013-07-07 (×2): qty 2

## 2013-07-07 MED ORDER — CYCLOBENZAPRINE HCL 10 MG PO TABS
10.0000 mg | ORAL_TABLET | Freq: Three times a day (TID) | ORAL | Status: DC | PRN
  Administered 2013-07-07 – 2013-07-08 (×2): 10 mg via ORAL
  Filled 2013-07-07 (×2): qty 1

## 2013-07-07 MED ORDER — KETOROLAC TROMETHAMINE 30 MG/ML IJ SOLN
30.0000 mg | Freq: Once | INTRAMUSCULAR | Status: AC
  Administered 2013-07-07: 30 mg via INTRAVENOUS

## 2013-07-07 MED ORDER — HYDROMORPHONE HCL PF 1 MG/ML IJ SOLN: 0.2500 mg | INTRAMUSCULAR | Status: DC | PRN

## 2013-07-07 MED ORDER — HYDROCODONE-ACETAMINOPHEN 5-325 MG PO TABS: 1.0000 | ORAL_TABLET | ORAL | Status: DC | PRN

## 2013-07-07 MED ORDER — MIDAZOLAM HCL 5 MG/5ML IJ SOLN
INTRAMUSCULAR | Status: DC | PRN
  Administered 2013-07-07: 2 mg via INTRAVENOUS

## 2013-07-07 MED ORDER — THROMBIN 5000 UNITS EX SOLR
OROMUCOSAL | Status: DC | PRN
  Administered 2013-07-07: 13:00:00 via TOPICAL

## 2013-07-07 MED ORDER — SODIUM CHLORIDE 0.9 % IJ SOLN
3.0000 mL | Freq: Two times a day (BID) | INTRAMUSCULAR | Status: DC
  Administered 2013-07-07: 3 mL via INTRAVENOUS

## 2013-07-07 MED ORDER — KETOROLAC TROMETHAMINE 30 MG/ML IJ SOLN
INTRAMUSCULAR | Status: AC
  Filled 2013-07-07: qty 1

## 2013-07-07 MED ORDER — PHENOL 1.4 % MT LIQD: 1.0000 | OROMUCOSAL | Status: DC | PRN

## 2013-07-07 MED ORDER — MIDAZOLAM HCL 2 MG/2ML IJ SOLN: 0.5000 mg | Freq: Once | INTRAMUSCULAR | Status: DC | PRN

## 2013-07-07 MED ORDER — ACETAMINOPHEN 325 MG PO TABS: 650.0000 mg | ORAL_TABLET | ORAL | Status: DC | PRN

## 2013-07-07 MED ORDER — OXYCODONE HCL 5 MG/5ML PO SOLN: 5.0000 mg | Freq: Once | ORAL | Status: AC | PRN

## 2013-07-07 MED ORDER — FENTANYL CITRATE 0.05 MG/ML IJ SOLN
INTRAMUSCULAR | Status: DC | PRN
  Administered 2013-07-07: 50 ug via INTRAVENOUS
  Administered 2013-07-07 (×2): 100 ug via INTRAVENOUS
  Administered 2013-07-07: 50 ug via INTRAVENOUS
  Administered 2013-07-07: 100 ug via INTRAVENOUS
  Administered 2013-07-07: 50 ug via INTRAVENOUS

## 2013-07-07 MED ORDER — ONDANSETRON HCL 4 MG/2ML IJ SOLN
INTRAMUSCULAR | Status: AC
  Filled 2013-07-07: qty 2

## 2013-07-07 MED ORDER — LIDOCAINE-EPINEPHRINE 1 %-1:100000 IJ SOLN
INTRAMUSCULAR | Status: DC | PRN
  Administered 2013-07-07: 20 mL

## 2013-07-07 MED ORDER — EPHEDRINE SULFATE 50 MG/ML IJ SOLN
INTRAMUSCULAR | Status: DC | PRN
  Administered 2013-07-07 (×4): 10 mg via INTRAVENOUS

## 2013-07-07 MED ORDER — PHENYLEPHRINE 40 MCG/ML (10ML) SYRINGE FOR IV PUSH (FOR BLOOD PRESSURE SUPPORT)
PREFILLED_SYRINGE | INTRAVENOUS | Status: AC
  Filled 2013-07-07: qty 10

## 2013-07-07 MED ORDER — OXYCODONE-ACETAMINOPHEN 5-325 MG PO TABS
1.0000 | ORAL_TABLET | ORAL | Status: DC | PRN
  Administered 2013-07-07: 1 via ORAL
  Administered 2013-07-08: 2 via ORAL
  Filled 2013-07-07: qty 1
  Filled 2013-07-07: qty 2

## 2013-07-07 MED ORDER — HYDROMORPHONE HCL PF 1 MG/ML IJ SOLN
INTRAMUSCULAR | Status: AC
  Filled 2013-07-07: qty 1

## 2013-07-07 MED ORDER — OXYCODONE HCL 5 MG PO TABS
ORAL_TABLET | ORAL | Status: AC
  Filled 2013-07-07: qty 1

## 2013-07-07 MED ORDER — LOSARTAN POTASSIUM 50 MG PO TABS
100.0000 mg | ORAL_TABLET | Freq: Every day | ORAL | Status: DC
  Administered 2013-07-07: 100 mg via ORAL
  Filled 2013-07-07 (×3): qty 2

## 2013-07-07 MED ORDER — ROCURONIUM BROMIDE 100 MG/10ML IV SOLN
INTRAVENOUS | Status: DC | PRN
  Administered 2013-07-07: 50 mg via INTRAVENOUS
  Administered 2013-07-07: 30 mg via INTRAVENOUS

## 2013-07-07 MED ORDER — OXYCODONE HCL 5 MG PO TABS
5.0000 mg | ORAL_TABLET | Freq: Once | ORAL | Status: AC | PRN
  Administered 2013-07-07: 5 mg via ORAL

## 2013-07-07 MED ORDER — PROPOFOL 10 MG/ML IV BOLUS
INTRAVENOUS | Status: DC | PRN
  Administered 2013-07-07: 150 mg via INTRAVENOUS

## 2013-07-07 MED ORDER — PROMETHAZINE HCL 25 MG/ML IJ SOLN: 6.2500 mg | INTRAMUSCULAR | Status: DC | PRN

## 2013-07-07 MED FILL — Sodium Chloride IV Soln 0.9%: INTRAVENOUS | Qty: 1000 | Status: AC

## 2013-07-07 MED FILL — Heparin Sodium (Porcine) Inj 1000 Unit/ML: INTRAMUSCULAR | Qty: 30 | Status: AC

## 2013-07-07 SURGICAL SUPPLY — 88 items
ADH SKN CLS APL DERMABOND .7 (GAUZE/BANDAGES/DRESSINGS) ×1
BAG DECANTER FOR FLEXI CONT (MISCELLANEOUS) ×2 IMPLANT
BLADE SURG ROTATE 9660 (MISCELLANEOUS) IMPLANT
BRUSH SCRUB EZ PLAIN DRY (MISCELLANEOUS) ×2 IMPLANT
BUR ACRON 5.0MM COATED (BURR) ×2 IMPLANT
BUR MATCHSTICK NEURO 3.0 LAGG (BURR) ×2 IMPLANT
CANISTER SUCT 3000ML (MISCELLANEOUS) ×2 IMPLANT
CAP LCK SPNE (Orthopedic Implant) ×4 IMPLANT
CAP LOCK SPINE RADIUS (Orthopedic Implant) IMPLANT
CAP LOCKING (Orthopedic Implant) ×8 IMPLANT
CONT SPEC 4OZ CLIKSEAL STRL BL (MISCELLANEOUS) ×2 IMPLANT
COVER BACK TABLE 24X17X13 BIG (DRAPES) IMPLANT
COVER TABLE BACK 60X90 (DRAPES) ×2 IMPLANT
DERMABOND ADVANCED (GAUZE/BANDAGES/DRESSINGS) ×1
DERMABOND ADVANCED .7 DNX12 (GAUZE/BANDAGES/DRESSINGS) ×2 IMPLANT
DRAPE C-ARM 42X72 X-RAY (DRAPES) ×4 IMPLANT
DRAPE LAPAROTOMY 100X72X124 (DRAPES) ×2 IMPLANT
DRAPE POUCH INSTRU U-SHP 10X18 (DRAPES) ×2 IMPLANT
DRAPE PROXIMA HALF (DRAPES) IMPLANT
DRSG EMULSION OIL 3X3 NADH (GAUZE/BANDAGES/DRESSINGS) IMPLANT
ELECT REM PT RETURN 9FT ADLT (ELECTROSURGICAL) ×2
ELECTRODE REM PT RTRN 9FT ADLT (ELECTROSURGICAL) ×1 IMPLANT
GAUZE SPONGE 4X4 16PLY XRAY LF (GAUZE/BANDAGES/DRESSINGS) ×1 IMPLANT
GLOVE BIO SURGEON STRL SZ7.5 (GLOVE) ×2 IMPLANT
GLOVE BIOGEL PI IND STRL 7.5 (GLOVE) IMPLANT
GLOVE BIOGEL PI IND STRL 8 (GLOVE) ×2 IMPLANT
GLOVE BIOGEL PI IND STRL 8.5 (GLOVE) IMPLANT
GLOVE BIOGEL PI INDICATOR 7.5 (GLOVE) ×1
GLOVE BIOGEL PI INDICATOR 8 (GLOVE) ×2
GLOVE BIOGEL PI INDICATOR 8.5 (GLOVE) ×1
GLOVE ECLIPSE 6.5 STRL STRAW (GLOVE) ×1 IMPLANT
GLOVE ECLIPSE 7.5 STRL STRAW (GLOVE) ×4 IMPLANT
GLOVE ECLIPSE 8.5 STRL (GLOVE) ×1 IMPLANT
GLOVE EXAM NITRILE LRG STRL (GLOVE) ×2 IMPLANT
GLOVE EXAM NITRILE MD LF STRL (GLOVE) IMPLANT
GLOVE EXAM NITRILE XL STR (GLOVE) IMPLANT
GLOVE EXAM NITRILE XS STR PU (GLOVE) IMPLANT
GLOVE INDICATOR 7.5 STRL GRN (GLOVE) ×1 IMPLANT
GLOVE SS BIOGEL STRL SZ 7 (GLOVE) IMPLANT
GLOVE SUPERSENSE BIOGEL SZ 7 (GLOVE) ×1
GOWN BRE IMP SLV AUR LG STRL (GOWN DISPOSABLE) ×2 IMPLANT
GOWN BRE IMP SLV AUR XL STRL (GOWN DISPOSABLE) ×1 IMPLANT
GOWN STRL REIN 2XL LVL4 (GOWN DISPOSABLE) ×1 IMPLANT
GOWN STRL REUS W/ TWL LRG LVL3 (GOWN DISPOSABLE) IMPLANT
GOWN STRL REUS W/ TWL XL LVL3 (GOWN DISPOSABLE) IMPLANT
GOWN STRL REUS W/TWL LRG LVL3 (GOWN DISPOSABLE) ×2
GOWN STRL REUS W/TWL MED LVL3 (GOWN DISPOSABLE) ×1 IMPLANT
GOWN STRL REUS W/TWL XL LVL3 (GOWN DISPOSABLE) ×2
HEMOSTAT POWDER KIT SURGIFOAM (HEMOSTASIS) ×1 IMPLANT
KIT BASIN OR (CUSTOM PROCEDURE TRAY) ×2 IMPLANT
KIT INFUSE SMALL (Orthopedic Implant) ×1 IMPLANT
KIT ROOM TURNOVER OR (KITS) ×2 IMPLANT
MILL MEDIUM DISP (BLADE) ×2 IMPLANT
NDL ASPIRATION RAN815N 8X15 (NEEDLE) IMPLANT
NDL SPNL 18GX3.5 QUINCKE PK (NEEDLE) IMPLANT
NDL SPNL 22GX3.5 QUINCKE BK (NEEDLE) ×2 IMPLANT
NEEDLE ASPIRATION RAN815N 8X15 (NEEDLE) ×1 IMPLANT
NEEDLE ASPIRATION RANFAC 8X15 (NEEDLE) ×1
NEEDLE BONE MARROW 8GAX6 (NEEDLE) IMPLANT
NEEDLE SPNL 18GX3.5 QUINCKE PK (NEEDLE) ×2 IMPLANT
NEEDLE SPNL 22GX3.5 QUINCKE BK (NEEDLE) ×4 IMPLANT
NS IRRIG 1000ML POUR BTL (IV SOLUTION) ×3 IMPLANT
PACK LAMINECTOMY NEURO (CUSTOM PROCEDURE TRAY) ×2 IMPLANT
PAD ARMBOARD 7.5X6 YLW CONV (MISCELLANEOUS) ×6 IMPLANT
PATTIES SURGICAL .5 X.5 (GAUZE/BANDAGES/DRESSINGS) IMPLANT
PATTIES SURGICAL .5 X1 (DISPOSABLE) IMPLANT
PATTIES SURGICAL 1X1 (DISPOSABLE) IMPLANT
PEEK PLIF AVS 10X25X4 (Peek) ×2 IMPLANT
PENCIL BUTTON HOLSTER BLD 10FT (ELECTRODE) ×1 IMPLANT
ROD RADIUS 35MM (Rod) ×2 IMPLANT
SCREW 5.75X45MM (Screw) ×4 IMPLANT
SPONGE GAUZE 4X4 12PLY (GAUZE/BANDAGES/DRESSINGS) ×2 IMPLANT
SPONGE LAP 4X18 X RAY DECT (DISPOSABLE) IMPLANT
SPONGE NEURO XRAY DETECT 1X3 (DISPOSABLE) IMPLANT
SPONGE SURGIFOAM ABS GEL 100 (HEMOSTASIS) ×2 IMPLANT
STRIP BIOACTIVE VITOSS 25X100X (Neuro Prosthesis/Implant) ×1 IMPLANT
STRIP BIOACTIVE VITOSS 25X52X4 (Orthopedic Implant) ×1 IMPLANT
SUT VIC AB 1 CT1 18XBRD ANBCTR (SUTURE) ×2 IMPLANT
SUT VIC AB 1 CT1 8-18 (SUTURE) ×4
SUT VIC AB 2-0 CP2 18 (SUTURE) ×4 IMPLANT
SUT VIC AB 3-0 SH 8-18 (SUTURE) ×1 IMPLANT
SYR 20ML ECCENTRIC (SYRINGE) ×2 IMPLANT
SYR 3ML LL SCALE MARK (SYRINGE) ×4 IMPLANT
SYR CONTROL 10ML LL (SYRINGE) ×2 IMPLANT
TOWEL OR 17X24 6PK STRL BLUE (TOWEL DISPOSABLE) ×2 IMPLANT
TOWEL OR 17X26 10 PK STRL BLUE (TOWEL DISPOSABLE) ×2 IMPLANT
TRAY FOLEY CATH 14FRSI W/METER (CATHETERS) ×2 IMPLANT
WATER STERILE IRR 1000ML POUR (IV SOLUTION) ×2 IMPLANT

## 2013-07-07 NOTE — H&P (Signed)
Subjective: Patient is a 68 y.o. female who is admitted for treatment of advanced degeneration at the L4-5 level including a dynamic degenerative spondylolisthesis and marked multifactorial lumbar stenosis.  Symptomatically she had begun with left lumbar radicular pain a year ago however symptoms progress to pain to the buttocks and posterior thighs bilaterally, aggravated by walking, and relieved by rest, consistent with neurogenic claudication.  Patient does have left L2-3 disc herniation as well as a left L3-4 foraminal disc protrusion, but her worst neural impingement is at the L4-5 level, and it is felt that that is the symptomatic level. Patient admitted now for a bilateral L4-5 lumbar decompression, including laminectomy, facetectomy, and foraminotomy, and arthrodesis including a posterior lumbar interbody arthrodesis with interbody implants and bone graft and a posterior lumbar arthrodesis with posterior instrumentation and bone graft.   Patient Active Problem List   Diagnosis Date Noted  . Systolic murmur   . Hypertension   . Atrial arrhythmia   . Mild aortic sclerosis   . Atrial septal hypertrophy   . Syncope    Past Medical History  Diagnosis Date  . Atrial arrhythmia   . Hypertension   . Insomnia   . Systolic murmur   . Atrial septal hypertrophy     , proximal, without significant gradient  . Mild aortic sclerosis   . Osteopenia   . Syncope     , Vagal  . Diverticulosis   . Dysrhythmia     afib  . GERD (gastroesophageal reflux disease)   . History of kidney stones   . Hypertrophic cardiomyopathy     severe LV basilar hypertrophy witn no evidence of significant outflow tract obstruction, EF 65-70%, mild LAE, mild TR, grade 1a diastolic dysfunction 05/15/10 (Dr. Donato Schultz)    Past Surgical History  Procedure Laterality Date  . Hernia repair    . Breast enhancement surgery Bilateral   . Inguinal hernia repair Bilateral   . Facial cosmetic surgery    . Left ovarian  cyst    . Tonsillectomy      Prescriptions prior to admission  Medication Sig Dispense Refill  . amLODipine (NORVASC) 10 MG tablet Take 5 mg by mouth daily.      [name] esomeprazole (NEXIUM) 40 MG capsule Take 40 mg by mouth daily as needed (acid reflux).      [name] losartan (COZAAR) 100 MG tablet Take 100 mg by mouth daily.      . Multiple Vitamin (MULTIVITAMIN WITH MINERALS) TABS Take 1 tablet by mouth at bedtime.      . nebivolol (BYSTOLIC) 10 MG tablet Take 10 mg by mouth at bedtime.      . traMADol (ULTRAM) 50 MG tablet Take 50 mg by mouth every 6 (six) hours as needed for moderate pain.       [name] zolpidem (AMBIEN) 10 MG tablet Take 10 mg by mouth at bedtime as needed for sleep.       Allergies  Allergen Reactions  . Zoloft [Sertraline Hcl] Other (See Comments)    Extreme indigestion,heartburn  . Atenolol Cough  . Sulfa Antibiotics Nausea Only  . Codeine Rash    History  Substance Use Topics  . Smoking status: Former Smoker -- 0.50 packs/day for 50 years    Types: Cigarettes    Quit date: 04/14/2013  . Smokeless tobacco: Not on file  . Alcohol Use: Yes     Comment: 1-2 per week    Family History  Problem Relation Age of Onset  .  Liver cancer Mother   . Lung cancer Father   . Breast cancer Sister      Review of Systems A comprehensive review of systems was negative.  Objective: Vital signs in last 24 hours: Temp:  [97.8 F (36.6 C)] 97.8 F (36.6 C) (03/25 0736) Pulse Rate:  [50] 50 (03/25 0736) Resp:  [18] 18 (03/25 0736) BP: (159)/(91) 159/91 mmHg (03/25 0736) SpO2:  [96 %] 96 % (03/25 0736) Weight:  [55.792 kg (123 lb)] 55.792 kg (123 lb) (03/25 0736)  EXAM: Patient well-developed well-nourished female in no acute distress. Lungs are clear to auscultation , the patient has symmetrical respiratory excursion. Heart has a regular rate and rhythm normal S1 and S2 no murmur.   Abdomen is soft nontender nondistended bowel sounds are present. Extremity examination shows no  clubbing cyanosis or edema. Motor examination shows 5 over 5 strength in the lower extremities including the iliopsoas quadriceps dorsiflexor extensor hallicus  longus and plantar flexor bilaterally. Sensation is intact to pinprick in the distal lower extremities. Reflexes are symmetrical bilaterally. No pathologic reflexes are present. Patient has a normal gait and stance.  Data Review:CBC    Component Value Date/Time   WBC 8.9 06/30/2013 1322   RBC 5.45* 06/30/2013 1322   HGB 17.5* 06/30/2013 1322   HCT 49.8* 06/30/2013 1322   PLT 229 06/30/2013 1322   MCV 91.4 06/30/2013 1322   MCH 32.1 06/30/2013 1322   MCHC 35.1 06/30/2013 1322   RDW 13.3 06/30/2013 1322                          BMET    Component Value Date/Time   NA 142 06/30/2013 1322   K 4.1 06/30/2013 1322   CL 103 06/30/2013 1322   CO2 27 06/30/2013 1322   GLUCOSE 99 06/30/2013 1322   BUN 30* 06/30/2013 1322   CREATININE 0.68 06/30/2013 1322   CALCIUM 9.9 06/30/2013 1322   GFRNONAA 88* 06/30/2013 1322   GFRAA >90 06/30/2013 1322     Assessment/Plan: Patient was multilevel degenerative changes but the dynamic degenerative spondylolisthesis at L4-5 and marked multifactorial lumbar stenosis L4-5 comment symptomatically neurogenic claudication. She is admitted now for an L4-5 lumbar decompression and arthrodesis. I've discussed with the patient the nature of his condition, the nature the surgical procedure, the typical length of surgery, hospital stay, and overall recuperation, the limitations postoperatively, and risks of surgery. I discussed risks including risks of infection, bleeding, possibly need for transfusion, the risk of nerve root dysfunction with pain, weakness, numbness, or paresthesias, the risk of dural tear and CSF leakage and possible need for further surgery, the risk of failure of the arthrodesis and possibly for further surgery, the risk of anesthetic complications including myocardial infarction, stroke, pneumonia, and  death. We discussed the need for postoperative immobilization in a lumbar brace. Understanding all this the patient does wish to proceed with surgery and is admitted for such.  Hewitt ShortsNUDELMAN,ROBERT W, MD 07/07/2013 9:14 AM

## 2013-07-07 NOTE — Op Note (Signed)
07/07/2013  1:06 PM  PATIENT:  Beverly Li  68 y.o. 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, lumbar degenerative disc disease, lumbar spondylosis, lumbar stenosis, neurogenic claudication  PROCEDURE:  Procedure(s):  POSTERIOR LUMBAR FUSION 1 LEVEL:  Bilateral L4-5 lumbar decompression including bilateral laminotomy, facetectomy, foraminotomy, and discectomy, with decompression of the central canal stenosis and foraminal stenosis with decompression of the exiting L4 and L5 nerve roots, with decompression beyond that required for arthrodesis; bilateral L4-5 posterior lumbar interbody arthrodesis with AVS peek interbody implants, Vitoss BA, and infuse; bilateral L4-5 posterior lateral arthrodesis with nonsegmental radius posterior instrumentation, Vitoss BA, and infuse  SURGEON:  Surgeon(s): Hewitt Shorts, MD Barnett Abu, MD  ASSISTANTS: Barnett Abu M.D.  ANESTHESIA:   general  EBL:  Total I/O In: 2750 [I.V.:2000; IV Piggyback:750] Out: 1275 [Urine:1025; Blood:250]  BLOOD ADMINISTERED:none  CELL SAVER GIVEN: Cell Saver technician and nurse anesthetist felt that there was insufficient blood loss to process the collected blood within the Cell Saver system  COUNT: Correct per nursing staff  DICTATION: Patient is brought to the operating room placed under general endotracheal anesthesia. The patient was turned to prone position the lumbar region was prepped with Betadine soap and solution and draped in a sterile fashion. The midline was infiltrated with local anesthesia with epinephrine. A localizing x-ray was taken and then a midline incision was made carried down through the subcutaneous tissue, bipolar cautery and electrocautery were used to maintain hemostasis. Dissection was  carried down to the lumbar fascia. The fascia was incised bilaterally and the paraspinal muscles were dissected from the spinous process and lamina in a subperiosteal fashion. Another x-ray was taken for localization and the L4-5 level was localized. Dissection was then carried out laterally over the facet complex and the transverse processes of L4 and L5 were exposed and decorticated. We proceeded with the decompression, performing a bilateral laminotomy, facetectomy, and foraminotomy using the high-speed drill and Kerrison punches. Dissection was carried out laterally including facetectomy and foraminotomies with decompression of the stenotic compression of the L4 and L5 nerve roots. Once the decompression stenotic compression of the thecal sac and exiting nerve roots was completed we proceeded with the posterior lumbar interbody arthrodesis. The annulus was incised bilaterally and the disc space entered. A thorough discectomy was performed using pituitary rongeurs and curettes. Once the discectomy was completed we began to prepare the endplate surfaces removing the cartilaginous endplates surface with paddle curettes. We then measured the height of the intervertebral disc space. We selected 10 x 25 x 4 AVS peek interbody implants.  The C-arm fluoroscope was then draped and brought in the field and we identified the pedicle entry points bilaterally at the L4 and L5 levels. Each of the 4 pedicles was probed, we aspirated bone marrow aspirate from the vertebral bodies, this was injected over a 10 cc and a 5 cc strip of Vitoss BA. Then each of the pedicles was examined with the ball probe good bony surfaces were found and no bony cuts were found. Each of the pedicles was then tapped with a 5.25 mm tap, again examined with the ball probe good threading was found and no bony cuts were found. We then placed 5.75 by 45 millimeter screws bilaterally at each level.  We then packed the AVS peek interbody implants with  Vitoss BA with bone marrow aspirate and infuse,  and then placed the first implant and on the right side, carefully retracting the thecal sac and nerve root medially. We then went back to the left side and placed a second implant on the left side again retracting the thecal sac and nerve root medially. Additional Vitoss BA with bone marrow aspirate and infuse was packed lateral to the implants.  We then packed the lateral gutter over the transverse processes and intertransverse space with Vitoss BA with bone marrow aspirate and infuse. We then selected a 35 mm pre-lordosed rods, they were placed within the screw heads and secured with locking caps once all 4 locking caps were placed final tightening was performed against a counter torque.  The wound had been irrigated multiple times during the procedure with saline solution and bacitracin solution, good hemostasis was established with a combination of bipolar cautery and Gelfoam with thrombin. Once good hemostasis was confirmed we proceeded with closure paraspinal muscles deep fascia and Scarpa's fascia were closed with interrupted undyed 1 Vicryl sutures the subcutaneous and subcuticular closed with interrupted inverted 2-0 and 3-0 undyed Vicryl sutures the skin edges were approximated with Dermabond. The wound was dressed with sterile gauze and Hypafix.  Following surgery the patient was turned back to the supine position to be reversed and the anesthetic extubated and transferred to the recovery room for further care.  PLAN OF CARE: Admit to inpatient   PATIENT DISPOSITION:  PACU - hemodynamically stable.   Delay start of Pharmacological VTE agent (>24hrs) due to surgical blood loss or risk of bleeding:  yes

## 2013-07-07 NOTE — Transfer of Care (Signed)
Immediate Anesthesia Transfer of Care Note  Patient: Beverly Li  Procedure(s) Performed: Procedure(s): POSTERIOR LUMBAR INTERBODY FUSION POSTERIOR LATERAL ARTHRODESIS AND POSTERIOR NONSEGMENTAL INSTRUMENTATION LUMBAR FOUR-FIVE (Bilateral)  Patient Location: PACU  Anesthesia Type:General  Level of Consciousness: awake, alert , oriented and patient cooperative  Airway & Oxygen Therapy: Patient Spontanous Breathing and Patient connected to nasal cannula oxygen  Post-op Assessment: Report given to PACU RN, Post -op Vital signs reviewed and stable and Patient moving all extremities X 4  Post vital signs: Reviewed and stable  Complications: No apparent anesthesia complications

## 2013-07-07 NOTE — Anesthesia Postprocedure Evaluation (Signed)
  Anesthesia Post-op Note  Patient: Beverly Li  Procedure(s) Performed: Procedure(s): POSTERIOR LUMBAR INTERBODY FUSION POSTERIOR LATERAL ARTHRODESIS AND POSTERIOR NONSEGMENTAL INSTRUMENTATION LUMBAR FOUR-FIVE (Bilateral)  Patient Location: PACU  Anesthesia Type:General  Level of Consciousness: awake, alert , oriented and patient cooperative  Airway and Oxygen Therapy: Patient Spontanous Breathing and Patient connected to nasal cannula oxygen  Post-op Pain: mild  Post-op Assessment: Post-op Vital signs reviewed, Patient's Cardiovascular Status Stable, Respiratory Function Stable, Patent Airway, No signs of Nausea or vomiting and Pain level controlled  Post-op Vital Signs: Reviewed and stable  Complications: No apparent anesthesia complications

## 2013-07-07 NOTE — Progress Notes (Signed)
Filed Vitals:   07/07/13 1515 07/07/13 1518 07/07/13 1530 07/07/13 1608  BP:  121/47  98/62  Pulse: 62  63 62  Temp:   96.8 F (36 C) 97.3 F (36.3 C)  TempSrc:      Resp: 15  11 16   Weight:      SpO2: 94%  94% 93%    Patient's been up and ambulating in the halls. Notes that she's much more comfortable and functional as compared to prior to surgery. Foley catheter been removed by nursing staff. Nursing staff to monitor voiding function. Dressing clean and dry. Moving all 4 extremities well.  Plan: Continued to progress through postoperative recovery.  Hewitt ShortsNUDELMAN,ROBERT W, MD 07/07/2013, 5:56 PM

## 2013-07-07 NOTE — Progress Notes (Signed)
Utilization review completed.  

## 2013-07-07 NOTE — Anesthesia Preprocedure Evaluation (Signed)
Anesthesia Evaluation  Patient identified by MRN, date of birth, ID band Patient awake    Reviewed: Allergy & Precautions, H&P , NPO status , Patient's Chart, lab work & pertinent test results, reviewed documented beta blocker date and time   History of Anesthesia Complications Negative for: history of anesthetic complications  Airway Mallampati: II TM Distance: >3 FB Neck ROM: Full    Dental  (+) Caps, Dental Advisory Given, Teeth Intact   Pulmonary COPDformer smoker (quit '14),  breath sounds clear to auscultation        Cardiovascular hypertension, Pt. on medications and Pt. on home beta blockers + dysrhythmias [facility](PAC) Rhythm:Regular Rate:Normal  '12 ECHO: HOCM without outflow obstruction, valves OK, EF 65-70%   Neuro/Psych Chronic back pain    GI/Hepatic Neg liver ROS, GERD-  Medicated and Controlled,  Endo/Other  negative endocrine ROS  Renal/GU negative Renal ROS     Musculoskeletal   Abdominal   Peds  Hematology negative hematology ROS (+)   Anesthesia Other Findings   Reproductive/Obstetrics                           Anesthesia Physical Anesthesia Plan  ASA: III  Anesthesia Plan: General   Post-op Pain Management:    Induction: Intravenous  Airway Management Planned: Oral ETT  Additional Equipment:   Intra-op Plan:   Post-operative Plan: Extubation in OR  Informed Consent: I have reviewed the patients History and Physical, chart, labs and discussed the procedure including the risks, benefits and alternatives for the proposed anesthesia with the patient or authorized representative who has indicated his/her understanding and acceptance.   Dental advisory given  Plan Discussed with: CRNA and Surgeon  Anesthesia Plan Comments: (Plan routine monitors, GETA)        Anesthesia Quick Evaluation

## 2013-07-08 MED ORDER — OXYCODONE-ACETAMINOPHEN 5-325 MG PO TABS: 1.0000 | ORAL_TABLET | ORAL | Status: DC | PRN

## 2013-07-08 MED ORDER — HYDROCODONE-ACETAMINOPHEN 5-325 MG PO TABS: 1.0000 | ORAL_TABLET | ORAL | Status: DC | PRN

## 2013-07-08 MED ORDER — CYCLOBENZAPRINE HCL 10 MG PO TABS: 5.0000 mg | ORAL_TABLET | Freq: Three times a day (TID) | ORAL | Status: DC | PRN

## 2013-07-08 NOTE — Discharge Instructions (Signed)
Wound Care °Leave incision open to air. °You may shower. °Do not scrub directly on incision.  °Do not put any creams, lotions, or ointments on incision. °Activity °Walk each and every day, increasing distance each day. °No lifting greater than 5 lbs.  Avoid bending, arching, and twisting. °No driving for 2 weeks; may ride as a passenger locally. °If provided with back brace, wear when out of bed.  It is not necessary to wear in bed. °Diet °Resume your normal diet.  °Return to Work °Will be discussed at you follow up appointment. °Call Your Doctor If Any of These Occur °Redness, drainage, or swelling at the wound.  °Temperature greater than 101 degrees. °Severe pain not relieved by pain medication. °Incision starts to come apart. °Follow Up Appt °Call today for appointment in 3 weeks ([phone]) or for problems.  If you have any hardware placed in your spine, you will need an x-ray before your appointment. °

## 2013-07-08 NOTE — Discharge Summary (Signed)
Physician Discharge Summary  Patient ID: Beverly Li MRN: [id number] DOB/AGE: [birth year] 68 y.o.  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 Postoperatively she is doing well, she is up and ambulating in the halls. Her dressing was removed, and the wound is healing nicely. There is no erythema, swelling, or drainage. She is voiding well (PVR 12 cc by bladder scan). We are discharging her home with instructions regarding wound care and activities. She is to return for followup with me in 3 weeks.  Discharge Exam: Blood pressure 125/73, pulse 48, temperature 98.7 F (37.1 C), temperature source Oral, resp. rate 18, weight 55.792 kg (123 lb), SpO2 92.00%.  Disposition: Home     Medication List         amLODipine 10 MG tablet  Commonly known as:  NORVASC  Take 5 mg by mouth daily.     cyclobenzaprine 10 MG tablet  Commonly known as:  FLEXERIL  Take 0.5 tablets (5 mg total) by mouth 3 (three) times daily as needed for muscle spasms.     esomeprazole 40 MG capsule  Commonly known as:  NEXIUM  Take 40 mg by mouth daily as needed (acid reflux).     HYDROcodone-acetaminophen 5-325 MG per tablet  Commonly known as:  NORCO/VICODIN  Take 1-2 tablets by mouth every 4 (four) hours as needed for moderate pain.     losartan 100 MG tablet  Commonly known as:  COZAAR  Take 100 mg by mouth daily.     multivitamin with minerals Tabs tablet  Take 1 tablet by mouth at  bedtime.     nebivolol 10 MG tablet  Commonly known as:  BYSTOLIC  Take 10 mg by mouth at bedtime.     oxyCODONE-acetaminophen 5-325 MG per tablet  Commonly known as:  PERCOCET/ROXICET  Take 1-2 tablets by mouth every 4 (four) hours as needed for severe pain.     traMADol 50 MG tablet  Commonly known as:  ULTRAM  Take 50 mg by mouth every 6 (six) hours as needed for moderate pain.     zolpidem 10 MG tablet  Commonly known as:  AMBIEN  Take 10 mg by mouth at bedtime as needed for sleep.         Signed: Hewitt ShortsNUDELMAN,ROBERT W, MD 07/08/2013, 9:46 AM

## 2014-01-11 ENCOUNTER — Ambulatory Visit (HOSPITAL_COMMUNITY)
Admission: RE | Admit: 2014-01-11 | Discharge: 2014-01-11 | Disposition: A | Discharge status: Home / Self Care | Payer: 59 | Source: Ambulatory Visit | Attending: Neurosurgery | Admitting: Neurosurgery

## 2014-01-11 ENCOUNTER — Other Ambulatory Visit (HOSPITAL_COMMUNITY): Payer: Self-pay | Admitting: Neurosurgery

## 2014-01-11 DIAGNOSIS — M79609 Pain in unspecified limb: Secondary | ICD-10-CM | POA: Diagnosis present

## 2014-01-11 DIAGNOSIS — I739 Peripheral vascular disease, unspecified: Secondary | ICD-10-CM

## 2014-01-11 DIAGNOSIS — I70209 Unspecified atherosclerosis of native arteries of extremities, unspecified extremity: Secondary | ICD-10-CM | POA: Diagnosis not present

## 2014-01-11 NOTE — Progress Notes (Signed)
VASCULAR LAB PRELIMINARY  ARTERIAL  ABI completed:    RIGHT    LEFT    PRESSURE WAVEFORM  PRESSURE WAVEFORM  BRACHIAL 145 Triphasic BRACHIAL 163 Triphasic  DP 99 Dampened Monophasic DP 88 Dampened Monophasic  PT 98 Dampened Monophasic PT 112 Dampened Monophasic    RIGHT LEFT  ABI 0.61 0.69   ABIs indicate a moderate reduction in arterial flow bilaterally at rest.  Duplex scan of the right lower extremity  Revealed moderate heterogeneous plaque in the common femoral and profunda artery with abnormal Doppler waveforms. There is a mild to moderate reduction in flow in the superficial femoral artery with a near 50% stenosis in the mid superficial femoral artery. Distal superficial femoral artery there is moderate heterogeneous plaque with diminishing flow and monophasic waveforms throughout the lower leg. 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  Duplex scan of the left lower extremity revealed moderate heterogeneous plaque in the common femoral and profunda artery. 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 There is recanalization of flow to the popliteal from what appears to be a collateral from the profunda artery. There is severely diminished flow distally from the popliteal fossa.   Beverly Li, RVS 01/11/2014, 4:16 PM

## 2014-01-28 ENCOUNTER — Other Ambulatory Visit: Payer: Self-pay

## 2014-02-09 ENCOUNTER — Encounter: Payer: Self-pay | Admitting: Vascular Surgery

## 2014-02-10 ENCOUNTER — Encounter: Payer: Self-pay | Admitting: Vascular Surgery

## 2014-02-10 ENCOUNTER — Ambulatory Visit (INDEPENDENT_AMBULATORY_CARE_PROVIDER_SITE_OTHER): Payer: 59 | Admitting: Vascular Surgery

## 2014-02-10 VITALS — BP 134/78 | HR 52 | Temp 97.5°F | Resp 16 | Ht 65.0 in | Wt 116.0 lb

## 2014-02-10 DIAGNOSIS — I70219 Atherosclerosis of native arteries of extremities with intermittent claudication, unspecified extremity: Secondary | ICD-10-CM | POA: Insufficient documentation

## 2014-02-10 NOTE — Progress Notes (Signed)
Patient ID: Beverly Li, female   DOB: 1945/12/20, 68 y.o.   MRN: [id number]  Reason for Consult: PVD   Referred by Clayborn Heron, MD  Subjective:     HPI:  Beverly Li is a 68 y.o. female who developed the gradual onset of pain and paresthesias which began in her hips and extended down both lower extremities. These symptoms developed gradually approximate 2 years ago. The symptoms are brought on by ambulation and relieved with rest. She does not experience these symptoms with sitting or standing. He has undergone previous back surgery. She states that her symptoms are worse when she walks on an incline. He denies any history of rest pain or nonhealing ulcers in her feet.  Her risk factors for peripheral vascular disease including history of tobacco use and hypertension. She had smoked 1 pack per day of cigarettes for over 50 years but quit 2 months ago. She denies any history of diabetes, hypercholesterolemia, history of previous myocardial infarction, or history of premature cardiovascular disease.  I have reviewed her records that were sent with her. She does have a history of hypertrophic cardiomyopathy. She also has a history of gastroesophageal reflux disease.  Past Medical History  Diagnosis Date  . Atrial arrhythmia   . Hypertension   . Insomnia   . Systolic murmur   . Atrial septal hypertrophy     , proximal, without significant gradient  . Mild aortic sclerosis   . Osteopenia   . Syncope     , Vagal  . Diverticulosis   . Dysrhythmia     afib  . GERD (gastroesophageal reflux disease)   . History of kidney stones   . Hypertrophic cardiomyopathy     severe LV basilar hypertrophy witn no evidence of significant outflow tract obstruction, EF 65-70%, mild LAE, mild TR, grade 1a diastolic dysfunction 05/15/10 (Dr. Donato Schultz)   Family History  Problem Relation Age of Onset  . Liver cancer Mother   . Lung cancer Father   . Breast cancer Sister    Past  Surgical History  Procedure Laterality Date  . Hernia repair    . Breast enhancement surgery Bilateral   . Inguinal hernia repair Bilateral   . Facial cosmetic surgery    . Left ovarian cyst    . Tonsillectomy     Short Social History:  History  Substance Use Topics  . Smoking status: Former Smoker -- 0.50 packs/day for 50 years    Types: Cigarettes    Quit date: 04/14/2013  . Smokeless tobacco: Not on file  . Alcohol Use: Yes     Comment: 1-2 per week   Allergies  Allergen Reactions  . Zoloft [Sertraline Hcl] Other (See Comments)    Extreme indigestion,heartburn  . Atenolol Cough  . Sulfa Antibiotics Nausea Only  . Codeine Rash   Current Outpatient Prescriptions  Medication Sig Dispense Refill  . amLODipine (NORVASC) 10 MG tablet Take 5 mg by mouth daily.      . cyclobenzaprine (FLEXERIL) 10 MG tablet Take 0.5 tablets (5 mg total) by mouth 3 (three) times daily as needed for muscle spasms.  30 tablet  0  . DEXILANT 30 MG capsule Take 1 capsule by mouth daily.      [name] esomeprazole (NEXIUM) 40 MG capsule Take 40 mg by mouth daily as needed (acid reflux).      [name] HYDROcodone-acetaminophen (NORCO/VICODIN) 5-325 MG per tablet Take 1-2 tablets by mouth every 4 (four) hours as needed  for moderate pain.  40 tablet  0  . losartan (COZAAR) 100 MG tablet Take 100 mg by mouth daily.      . Multiple Vitamin (MULTIVITAMIN WITH MINERALS) TABS Take 1 tablet by mouth at bedtime.      . nebivolol (BYSTOLIC) 10 MG tablet Take 10 mg by mouth at bedtime.      [name]. oxyCODONE-acetaminophen (PERCOCET/ROXICET) 5-325 MG per tablet Take 1-2 tablets by mouth every 4 (four) hours as needed for severe pain.  40 tablet  0  . rosuvastatin (CRESTOR) 5 MG tablet Take 5 mg by mouth daily.      . sertraline (ZOLOFT) 50 MG tablet Take 1 tablet by mouth at bedtime.      . traMADol (ULTRAM) 50 MG tablet Take 50 mg by mouth every 6 (six) hours as needed for moderate pain.       [name]. zolpidem (AMBIEN) 10 MG tablet Take 10  mg by mouth at bedtime as needed for sleep.       No current facility-administered medications for this visit.    Review of Systems  Constitutional: Negative for chills and fever.  Eyes: Negative for loss of vision.  Respiratory: Negative for cough and wheezing.  Cardiovascular: Positive for claudication. Negative for chest pain, chest tightness, dyspnea with exertion, orthopnea and palpitations.  GI: Negative for blood in stool and vomiting.  GU: Negative for dysuria and hematuria.  Musculoskeletal: Negative for leg pain, joint pain and myalgias.  Skin: Negative for rash and wound.  Neurological: Negative for dizziness and speech difficulty.  Hematologic: Negative for bruises/bleeds easily. Psychiatric: Negative for depressed mood.       Objective:  Objective  Filed Vitals:   02/10/14 1513  BP: 134/78  Pulse: 52  Temp: 97.5 F (36.4 C)  TempSrc: Oral  Resp: 16  Height: 5\' 5"  (1.651 m)  Weight: 116 lb (52.617 kg)  SpO2: 99%   Body mass index is 19.3 kg/(m^2).  Physical Exam  Constitutional: She is oriented to person, place, and time. She appears well-developed and well-nourished.  HENT:  Head: Normocephalic and atraumatic.  Neck: Neck supple. No JVD present. No thyromegaly present.  Cardiovascular: Normal rate, regular rhythm and normal heart sounds.  Exam reveals no friction rub.   No murmur heard. Pulses:      Femoral pulses are 2+ on the right side, and 2+ on the left side.      Popliteal pulses are 0 on the right side, and 0 on the left side.       Dorsalis pedis pulses are 0 on the right side, and 0 on the left side.       Posterior tibial pulses are 0 on the right side, and 0 on the left side.  I do not detect carotid bruits.  Pulmonary/Chest: Breath sounds normal. She has no wheezes. She has no rales.  Abdominal: Soft. Bowel sounds are normal. There is no tenderness.  Musculoskeletal: Normal range of motion. She exhibits no edema.  Lymphadenopathy:    She  has no cervical adenopathy.  Neurological: She is alert and oriented to person, place, and time. She has normal strength. No sensory deficit.  Skin: No lesion and no rash noted.  Psychiatric: She has a normal mood and affect.   Data: I have reviewed her arterial duplex scan which was done on 01/11/2014. At that time she is noted to have monophasic Doppler signals in the dorsalis pedis and posterior tibial positions bilaterally. ABI on the right is  61%. ABI on the left is 69%. The right common femoral artery has a biphasic waveform at rest. The left common femoral artery has a biphasic waveform at rest.      Assessment/Plan:    Atherosclerosis of native arteries of extremity with intermittent claudication Based on her history, it sounds like she could have aortoiliac occlusive disease. However she has palpable femoral pulses and biphasic waveforms in the common femoral arteries bilaterally at rest. Duplex suggests infrainguinal arterial occlusive disease. 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 However her symptoms at this point are tolerable. She quit smoking 2 months ago. She has been started on a statin. I have encouraged her to take an aspirin a day if she can tolerate this. I've encouraged her to get on a structured walking program. We have discussed the importance of nutrition, specifically eating lots of vegetables and trying to minimize processed foods. I've explained that we would consider arteriography if she felt that her symptoms were limiting her lifestyle or she developed rest pain or nonhealing ulcer. If her symptoms do not progress all plan on seeing her back in one year for follow up ABIs. If she decides that her symptoms are significantly limiting her lifestyle and she will call we can schedule an arteriogram sooner.    Chuck Hinthristopher S Dickson MD [facility]

## 2014-02-10 NOTE — Assessment & Plan Note (Signed)
Based on her history, it sounds like she could have aortoiliac occlusive disease. However she has palpable femoral pulses and biphasic waveforms in the common femoral arteries bilaterally at rest. Duplex suggests infrainguinal arterial occlusive disease. 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 However her symptoms at this point are tolerable. She quit smoking 2 months ago. She has been started on a statin. I have encouraged her to take an aspirin a day if she can tolerate this. I've encouraged her to get on a structured walking program. We have discussed the importance of nutrition, specifically eating lots of vegetables and trying to minimize processed foods. I've explained that we would consider arteriography if she felt that her symptoms were limiting her lifestyle or she developed rest pain or nonhealing ulcer. If her symptoms do not progress all plan on seeing her back in one year for follow up ABIs. If she decides that her symptoms are significantly limiting her lifestyle and she will call we can schedule an arteriogram sooner.

## 2014-02-11 NOTE — Addendum Note (Signed)
Addended by: Adria DillELDRIDGE-LEWIS, Chelan Heringer L on: 02/11/2014 02:14 PM   Modules accepted: Orders

## 2014-10-10 ENCOUNTER — Other Ambulatory Visit: Payer: Self-pay

## 2015-01-02 ENCOUNTER — Encounter: Payer: Self-pay | Admitting: Vascular Surgery

## 2015-01-04 ENCOUNTER — Ambulatory Visit (INDEPENDENT_AMBULATORY_CARE_PROVIDER_SITE_OTHER): Payer: Medicare Other | Admitting: Vascular Surgery

## 2015-01-04 ENCOUNTER — Encounter: Payer: Self-pay | Admitting: Vascular Surgery

## 2015-01-04 ENCOUNTER — Ambulatory Visit (HOSPITAL_COMMUNITY)
Admission: RE | Admit: 2015-01-04 | Discharge: 2015-01-04 | Disposition: A | Discharge status: Home / Self Care | Payer: Medicare Other | Source: Ambulatory Visit | Attending: Vascular Surgery | Admitting: Vascular Surgery

## 2015-01-04 VITALS — BP 179/96 | HR 46 | Temp 97.8°F | Resp 14 | Ht 65.0 in | Wt 127.0 lb

## 2015-01-04 DIAGNOSIS — I70219 Atherosclerosis of native arteries of extremities with intermittent claudication, unspecified extremity: Secondary | ICD-10-CM | POA: Insufficient documentation

## 2015-01-04 DIAGNOSIS — I10 Essential (primary) hypertension: Secondary | ICD-10-CM | POA: Diagnosis not present

## 2015-01-04 NOTE — Progress Notes (Signed)
Filed Vitals:   01/04/15 1044 01/04/15 1054  BP: 187/93 179/96  Pulse: 47 46  Temp: 97.8 F (36.6 C)   TempSrc: Oral   Resp: 14   Height:  (1.651 m)   Weight: 127 lb (57.607 kg)   SpO2: 98%

## 2015-01-04 NOTE — Progress Notes (Signed)
 [facility] [place]  Patient name: Beverly Li MRN: [id number] DOB: 04/08/1946 Sex: female  REASON FOR VISIT: Follow up of bilateral lower extremity claudication.  HPI: Beverly Li is a 69 y.o. female who I last saw on 02/10/2014. At that time, arterial Doppler study showed an ABI of 61% on the right and 69% on the left. I felt that based on her history it sounded like she could have some aortoiliac occlusive disease. She also had evidence of infrainguinal arterial occlusive disease. Her symptoms were tolerable and we were recommending a conservative approach. She comes in for follow up visit.  She states that her symptoms have gradually progressed. She experiences pain in her thighs and calves and hips bilaterally. Her symptoms are brought on by ambulation and relieved with rest. There are premature same in both lower extremity. She also admits to some postprandial abdominal pain and a 20 pound weight loss although the symptoms have recently resolved. She denies any history of rest pain or nonhealing wounds. She does smoke cigarettes but is trying to quit again.  Past Medical History  Diagnosis Date  . Atrial arrhythmia   . Hypertension   . Insomnia   . Systolic murmur   . Atrial septal hypertrophy     , proximal, without significant gradient  . Mild aortic sclerosis   . Osteopenia   . Syncope     , Vagal  . Diverticulosis   . Dysrhythmia     afib  . GERD (gastroesophageal reflux disease)   . History of kidney stones   . Hypertrophic cardiomyopathy     severe LV basilar hypertrophy witn no evidence of significant outflow tract obstruction, EF 65-70%, mild LAE, mild TR, grade 1a diastolic dysfunction 05/15/10 (Dr. Mark Skains)  . Atrial fibrillation   . Peripheral vascular disease    Family History  Problem Relation Age of Onset  . Liver cancer Mother   . Cancer Mother     Liver  . Hypertension Mother   . Lung cancer Father   . Cancer Father     Lung  . Breast cancer Sister   . Cancer Sister     Breast   SOCIAL HISTORY: Social History  Substance Use Topics  . Smoking status: Former Smoker -- 0.50 packs/day for 50 years    Types: Cigarettes    Quit date: 04/14/2013  . Smokeless tobacco: Never Used  . Alcohol Use: Yes     Comment: 1-2 per week   Allergies  Allergen Reactions  . Zoloft [Sertraline Hcl] Other (See Comments)    Extreme indigestion,heartburn  . Atenolol Cough  . Sulfa Antibiotics Nausea Only  . Codeine Rash   Current Outpatient Prescriptions  Medication Sig Dispense Refill  . aspirin 325 MG tablet Take 325 mg by mouth daily.    . buPROPion (WELLBUTRIN XL) 150 MG 24 hr tablet TK 1 T PO IN THE MORNING ONCE A DAY  0  . DEXILANT 30 MG capsule Take 1 capsule by mouth daily.    . esomeprazole (NEXIUM) 40 MG capsule Take 40 mg by mouth daily as needed (acid reflux).    . losartan (COZAAR) 100 MG tablet Take 100 mg by mouth daily.    . Multiple Vitamin (MULTIVITAMIN WITH MINERALS) TABS Take 1 tablet by mouth at bedtime.    . nebivolol (BYSTOLIC) 10 MG tablet Take 10 mg by mouth at bedtime.    . rosuvastatin (CRESTOR) 5 MG tablet Take 5 mg by   mouth daily.    . zolpidem (AMBIEN) 10 MG tablet Take 10 mg by mouth at bedtime as needed for sleep.    . amLODipine (NORVASC) 10 MG tablet Take 5 mg by mouth daily.    . cyclobenzaprine (FLEXERIL) 10 MG tablet Take 0.5 tablets (5 mg total) by mouth 3 (three) times daily as needed for muscle spasms. (Patient not taking: Reported on 01/04/2015) 30 tablet 0  . HYDROcodone-acetaminophen (NORCO/VICODIN) 5-325 MG per tablet Take 1-2 tablets by mouth every 4 (four) hours as needed for moderate pain. (Patient not taking: Reported on 01/04/2015) 40 tablet 0  . oxyCODONE-acetaminophen (PERCOCET/ROXICET) 5-325 MG per tablet Take 1-2 tablets by mouth every 4 (four) hours as needed for severe pain. (Patient not taking: Reported on 01/04/2015) 40 tablet 0  . sertraline (ZOLOFT) 50 MG tablet  Take 1 tablet by mouth at bedtime.    . traMADol (ULTRAM) 50 MG tablet Take 50 mg by mouth every 6 (six) hours as needed for moderate pain.      No current facility-administered medications for this visit.   REVIEW OF SYSTEMS: [X ] denotes positive finding; [  ] denotes negative finding  CARDIOVASCULAR:  [ ] chest pain   [ ] chest pressure   [ ] palpitations   [ ] orthopnea   [ ] dyspnea on exertion   [X ] claudication   [ ] rest pain   [ ] DVT   [ ] phlebitis PULMONARY:   [ ] productive cough   [ ] asthma   [ ] wheezing NEUROLOGIC:   [X ] weakness  [ ] paresthesias  [ ] aphasia  [ ] amaurosis  [ ] dizziness HEMATOLOGIC:   [ ] bleeding problems   [ ] clotting disorders MUSCULOSKELETAL:  [ ] joint pain   [ ] joint swelling [ ] leg swelling GASTROINTESTINAL: [ ]  blood in stool  [ ]  hematemesis GENITOURINARY:  [ ]  dysuria  [ ]  hematuria PSYCHIATRIC:  [ ] history of major depression INTEGUMENTARY:  [ ] rashes  [ ] ulcers CONSTITUTIONAL:  [ ] fever   [ ] chills  PHYSICAL EXAM: Filed Vitals:   01/04/15 1044 01/04/15 1054  BP: 187/93 179/96  Pulse: 47 46  Temp: 97.8 F (36.6 C)   TempSrc: Oral   Resp: 14   Height: 5' 5" (1.651 m)   Weight: 127 lb (57.607 kg)   SpO2: 98%    GENERAL: The patient is a well-nourished female, in no acute distress. The vital signs are documented above. CARDIAC: There is a regular rate and rhythm.  VASCULAR: I do not detect carotid bruits. She has palpable femoral pulses bilaterally. I cannot palpate popliteal or pedal pulses on either side. PULMONARY: There is good air exchange bilaterally without wheezing or rales. ABDOMEN: Soft and non-tender with normal pitched bowel sounds.  MUSCULOSKELETAL: There are no major deformities or cyanosis. NEUROLOGIC: No focal weakness or paresthesias are detected. SKIN: There are no ulcers or rashes noted. PSYCHIATRIC: The patient has a normal affect.  DATA:  LOWER EXTREMITY ARTERIAL DOPPLER STUDY: I have  independently interpreted her lower extremity arterial Doppler study.   MEDICAL ISSUES:  ATHEROSCLEROSIS WITH BILATERAL LOWER EXTREMITY CLAUDICATION: Her symptoms in both lower extremities have progressed. She would like to pursue arteriography. I have reviewed the indications for lower extremity bypass. I have also reviewed the potential complications of surgery including but not limited to: wound healing problems, infection, graft thrombosis, limb loss, or   other unpredictable medical problems. All the patient's questions were answered and they are agreeable to proceed. We have also discussed the importance of tobacco cessation. She is on aspirin and is on a statin.  POSTPRANDIAL ABDOMINAL PAIN: I have explained that we will also evaluate her mesenteric arteries at the time of her arteriogram in order to evaluate her postprandial abdominal pain.  HYPERTENSION: The patient's initial blood pressure today was elevated. We repeated this and this was still elevated. We have encouraged the patient to follow up with their primary care physician for management of their blood pressure.  Dickson, Christopher [facility] [place] Beeper: [phone]        

## 2015-01-06 ENCOUNTER — Other Ambulatory Visit: Payer: Self-pay

## 2015-01-16 ENCOUNTER — Ambulatory Visit (HOSPITAL_COMMUNITY)
Admission: RE | Admit: 2015-01-16 | Discharge: 2015-01-16 | Disposition: A | Discharge status: Home / Self Care | Payer: Medicare Other | Source: Ambulatory Visit | Attending: Vascular Surgery | Admitting: Vascular Surgery

## 2015-01-16 ENCOUNTER — Other Ambulatory Visit: Payer: Self-pay | Admitting: *Deleted

## 2015-01-16 ENCOUNTER — Encounter (HOSPITAL_COMMUNITY)
Admission: RE | Disposition: A | Discharge status: Home / Self Care | Payer: Self-pay | Source: Ambulatory Visit | Attending: Vascular Surgery

## 2015-01-16 DIAGNOSIS — Z87442 Personal history of urinary calculi: Secondary | ICD-10-CM | POA: Insufficient documentation

## 2015-01-16 DIAGNOSIS — Z7982 Long term (current) use of aspirin: Secondary | ICD-10-CM | POA: Diagnosis not present

## 2015-01-16 DIAGNOSIS — M8580 Other specified disorders of bone density and structure, unspecified site: Secondary | ICD-10-CM | POA: Insufficient documentation

## 2015-01-16 DIAGNOSIS — Z01818 Encounter for other preprocedural examination: Secondary | ICD-10-CM

## 2015-01-16 DIAGNOSIS — I422 Other hypertrophic cardiomyopathy: Secondary | ICD-10-CM | POA: Insufficient documentation

## 2015-01-16 DIAGNOSIS — I70213 Atherosclerosis of native arteries of extremities with intermittent claudication, bilateral legs: Secondary | ICD-10-CM | POA: Insufficient documentation

## 2015-01-16 DIAGNOSIS — R109 Unspecified abdominal pain: Secondary | ICD-10-CM | POA: Diagnosis not present

## 2015-01-16 DIAGNOSIS — K219 Gastro-esophageal reflux disease without esophagitis: Secondary | ICD-10-CM | POA: Diagnosis not present

## 2015-01-16 DIAGNOSIS — I700 Atherosclerosis of aorta: Secondary | ICD-10-CM | POA: Insufficient documentation

## 2015-01-16 DIAGNOSIS — K5790 Diverticulosis of intestine, part unspecified, without perforation or abscess without bleeding: Secondary | ICD-10-CM | POA: Diagnosis not present

## 2015-01-16 DIAGNOSIS — F17210 Nicotine dependence, cigarettes, uncomplicated: Secondary | ICD-10-CM | POA: Diagnosis not present

## 2015-01-16 DIAGNOSIS — I10 Essential (primary) hypertension: Secondary | ICD-10-CM | POA: Diagnosis not present

## 2015-01-16 DIAGNOSIS — I739 Peripheral vascular disease, unspecified: Secondary | ICD-10-CM | POA: Diagnosis present

## 2015-01-16 DIAGNOSIS — I4891 Unspecified atrial fibrillation: Secondary | ICD-10-CM | POA: Diagnosis not present

## 2015-01-16 DIAGNOSIS — G4700 Insomnia, unspecified: Secondary | ICD-10-CM | POA: Diagnosis not present

## 2015-01-16 DIAGNOSIS — I70219 Atherosclerosis of native arteries of extremities with intermittent claudication, unspecified extremity: Secondary | ICD-10-CM | POA: Diagnosis present

## 2015-01-16 HISTORY — PX: PERIPHERAL VASCULAR CATHETERIZATION: SHX172C

## 2015-01-16 LAB — POCT I-STAT, CHEM 8
BUN: 41 mg/dL — AB (ref 6–20)
CALCIUM ION: 1.21 mmol/L (ref 1.13–1.30)
CHLORIDE: 105 mmol/L (ref 101–111)
CREATININE: 0.8 mg/dL (ref 0.44–1.00)
GLUCOSE: 92 mg/dL (ref 65–99)
HCT: 56 % — ABNORMAL HIGH (ref 36.0–46.0)
Hemoglobin: 19 g/dL — ABNORMAL HIGH (ref 12.0–15.0)
Potassium: 4.1 mmol/L (ref 3.5–5.1)
SODIUM: 142 mmol/L (ref 135–145)
TCO2: 25 mmol/L (ref 0–100)

## 2015-01-16 SURGERY — ABDOMINAL AORTOGRAM
Anesthesia: LOCAL

## 2015-01-16 MED ORDER — MIDAZOLAM HCL 2 MG/2ML IJ SOLN
INTRAMUSCULAR | Status: AC
  Filled 2015-01-16: qty 4

## 2015-01-16 MED ORDER — FENTANYL CITRATE (PF) 100 MCG/2ML IJ SOLN
INTRAMUSCULAR | Status: AC
  Filled 2015-01-16: qty 4

## 2015-01-16 MED ORDER — LIDOCAINE HCL (PF) 1 % IJ SOLN
INTRAMUSCULAR | Status: DC | PRN
  Administered 2015-01-16: 08:00:00

## 2015-01-16 MED ORDER — FENTANYL CITRATE (PF) 100 MCG/2ML IJ SOLN
INTRAMUSCULAR | Status: DC | PRN
  Administered 2015-01-16: 50 ug via INTRAVENOUS

## 2015-01-16 MED ORDER — HEPARIN (PORCINE) IN NACL 2-0.9 UNIT/ML-% IJ SOLN
INTRAMUSCULAR | Status: AC
  Filled 2015-01-16: qty 1000

## 2015-01-16 MED ORDER — IODIXANOL 320 MG/ML IV SOLN
INTRAVENOUS | Status: DC | PRN
  Administered 2015-01-16: 185 mL via INTRA_ARTERIAL

## 2015-01-16 MED ORDER — SODIUM CHLORIDE 0.9 % IV SOLN: 1.0000 mL/kg/h | INTRAVENOUS | Status: DC

## 2015-01-16 MED ORDER — MIDAZOLAM HCL 2 MG/2ML IJ SOLN
INTRAMUSCULAR | Status: DC | PRN
  Administered 2015-01-16: 1 mg via INTRAVENOUS

## 2015-01-16 MED ORDER — LIDOCAINE HCL (PF) 1 % IJ SOLN
INTRAMUSCULAR | Status: AC
  Filled 2015-01-16: qty 30

## 2015-01-16 MED ORDER — SODIUM CHLORIDE 0.9 % IV SOLN
INTRAVENOUS | Status: DC
  Administered 2015-01-16: 07:00:00 via INTRAVENOUS

## 2015-01-16 SURGICAL SUPPLY — 10 items
CATH ANGIO 5F PIGTAIL 65CM (CATHETERS) ×1 IMPLANT
COVER PRB 48X5XTLSCP FOLD TPE (BAG) IMPLANT
COVER PROBE 5X48 (BAG) ×2
KIT MICROINTRODUCER STIFF 5F (SHEATH) ×1 IMPLANT
KIT PV (KITS) ×2 IMPLANT
SHEATH PINNACLE 5F 10CM (SHEATH) ×1 IMPLANT
SYR MEDRAD MARK V 150ML (SYRINGE) ×2 IMPLANT
TRANSDUCER W/STOPCOCK (MISCELLANEOUS) ×2 IMPLANT
TRAY PV CATH (CUSTOM PROCEDURE TRAY) ×2 IMPLANT
WIRE HITORQ VERSACORE ST 145CM (WIRE) ×1 IMPLANT

## 2015-01-16 NOTE — Interval H&P Note (Signed)
History and Physical Interval Note:  01/16/2015 7:23 AM  Beverly Li  has presented today for surgery, with the diagnosis of pvd with bilateral Lower extremity claudication  The various methods of treatment have been discussed with the patient and family. After consideration of risks, benefits and other options for treatment, the patient has consented to  Procedure(s): Abdominal Aortogram (N/A) as a surgical intervention .  The patient's history has been reviewed, patient examined, no change in status, stable for surgery.  I have reviewed the patient's chart and labs.  Questions were answered to the patient's satisfaction.     Waverly Ferrari

## 2015-01-16 NOTE — Discharge Instructions (Signed)

## 2015-01-16 NOTE — Progress Notes (Addendum)
Site area: RFA Site Prior to Removal:  Level 0 Pressure Applied For:87min Manual: yes Patient Status During Pull:stable Post Pull Site:  Level 0 Post Pull Instructions Given:  yes Post Pull Pulses Present: doppler Dressing Applied:  clear Bedrest begins @ 0900 till 1300 Comments:

## 2015-01-16 NOTE — Op Note (Signed)
   PATIENT: Beverly Li   MRN: [id number] DOB: [birth year]    DATE OF PROCEDURE: 01/16/2015  INDICATIONS: [name] is a 69 y.o. female presents with progressive claudication of both lower extremities that was interfering with her lifestyle. She wished to pursue arteriography and possible revascularization.   PROCEDURE:  1. Ultrasound-guided access to the right common femoral artery 2. Aortogram with bilateral iliac arteriogram and bilateral lower extremity runoff  SURGEON: Di Kindle. Edilia Bo, MD, FACS  ANESTHESIA: local with sedation   EBL: minimal  TECHNIQUE: The patient was taken to the peripheral vascular lab and received 1 mg of Versed and 50 g of fentanyl. Both groins were prepped and draped in usual sterile fashion. Under ultrasound guidance, after the skin was anesthetized, the right common femoral artery was cannulated with a micropuncture needle and a micropunch sheath introduced over a wire. This was exchanged for a 5 French sheath over a versa core wire. A pigtail catheter was positioned at the L1 vertebral body and flush aortogram obtained. The cath was then advanced in a lateral projection was obtained in order to evaluate the mesenteric vessels. The catheter was then brought down above the bifurcation and oblique iliac projections were obtained. Next bilateral lower extremity runoff films were obtained.  FINDINGS:  1. There are single renal arteries bilaterally with no significant renal artery stenosis identified. The infrarenal aorta has calcific disease but no focal stenosis. Bilateral common iliac arteries, external iliac arteries, and hypogastric arteries are patent. 2. On the right side, there is plaque in the common femoral artery. There is moderate diffuse disease around the superficial femoral artery which is then occluded at the adductor canal with reconstitution of the popliteal artery at the level of the knee. There is three-vessel runoff on the right via the  anterior tibial, posterior tibial, and peroneal arteries. 3. On the left side, there is eccentric calcific plaque in the left common femoral artery. The left superficial femoral artery is occluded at its origin with reconstitution of the above-knee pop to artery. The popliteal artery is patent. There is three-vessel runoff on the left unit and her tibial, posterior tibial, and arteries.  CLINICAL NOTE: I will see her back in the office to discuss possible staged bilateral femoropopliteal bypass grafts versus continued conservative treatment including a structured walking program and tobacco cessation. I have discussed with her the option of starting vascular rehabilitation which she is interested in. Given her cardiac history we will try to get clearance from Dr. Donato Schultz  Waverly Ferrari, MD, FACS [facility]  DATE OF DICTATION:   01/16/2015

## 2015-01-16 NOTE — H&P (View-Only) (Signed)
[facility]ist of [place]  Patient name: Beverly Li MRN: [id number] DOB: 1945/07/27 Sex: female  REASON FOR VISIT: Follow up of bilateral lower extremity claudication.  HPI: Beverly Li is a 68 y.o. female who I last saw on 02/10/2014. At that time, arterial Doppler study showed an ABI of 61% on the right and 69% on the left. I felt that based on her history it sounded like she could have some aortoiliac occlusive disease. She also had evidence of infrainguinal arterial occlusive disease. Her symptoms were tolerable and we were recommending a conservative approach. She comes in for follow up visit.  She states that her symptoms have gradually progressed. She experiences pain in her thighs and calves and hips bilaterally. Her symptoms are brought on by ambulation and relieved with rest. There are premature same in both lower extremity. She also admits to some postprandial abdominal pain and a 20 pound weight loss although the symptoms have recently resolved. She denies any history of rest pain or nonhealing wounds. She does smoke cigarettes but is trying to quit again.  Past Medical History  Diagnosis Date  . Atrial arrhythmia   . Hypertension   . Insomnia   . Systolic murmur   . Atrial septal hypertrophy     , proximal, without significant gradient  . Mild aortic sclerosis   . Osteopenia   . Syncope     , Vagal  . Diverticulosis   . Dysrhythmia     afib  . GERD (gastroesophageal reflux disease)   . History of kidney stones   . Hypertrophic cardiomyopathy     severe LV basilar hypertrophy witn no evidence of significant outflow tract obstruction, EF 65-70%, mild LAE, mild TR, grade 1a diastolic dysfunction 05/15/10 (Dr. Donato Schultz)  . Atrial fibrillation   . Peripheral vascular disease    Family History  Problem Relation Age of Onset  . Liver cancer Mother   . Cancer Mother     Liver  . Hypertension Mother   . Lung cancer Father   . Cancer Father     Lung  . Breast cancer Sister   . Cancer Sister     Breast   SOCIAL HISTORY: Social History  Substance Use Topics  . Smoking status: Former Smoker -- 0.50 packs/day for 50 years    Types: Cigarettes    Quit date: 04/14/2013  . Smokeless tobacco: Never Used  . Alcohol Use: Yes     Comment: 1-2 per week   Allergies  Allergen Reactions  . Zoloft [Sertraline Hcl] Other (See Comments)    Extreme indigestion,heartburn  . Atenolol Cough  . Sulfa Antibiotics Nausea Only  . Codeine Rash   Current Outpatient Prescriptions  Medication Sig Dispense Refill  . aspirin 325 MG tablet Take 325 mg by mouth daily.    [name] buPROPion (WELLBUTRIN XL) 150 MG 24 hr tablet TK 1 T PO IN THE MORNING ONCE A DAY  0  . DEXILANT 30 MG capsule Take 1 capsule by mouth daily.    [name] esomeprazole (NEXIUM) 40 MG capsule Take 40 mg by mouth daily as needed (acid reflux).    [name] losartan (COZAAR) 100 MG tablet Take 100 mg by mouth daily.    . Multiple Vitamin (MULTIVITAMIN WITH MINERALS) TABS Take 1 tablet by mouth at bedtime.    . nebivolol (BYSTOLIC) 10 MG tablet Take 10 mg by mouth at bedtime.    . rosuvastatin (CRESTOR) 5 MG tablet Take 5 mg by  mouth daily.    [name] zolpidem (AMBIEN) 10 MG tablet Take 10 mg by mouth at bedtime as needed for sleep.    [name] amLODipine (NORVASC) 10 MG tablet Take 5 mg by mouth daily.    . cyclobenzaprine (FLEXERIL) 10 MG tablet Take 0.5 tablets (5 mg total) by mouth 3 (three) times daily as needed for muscle spasms. (Patient not taking: Reported on 01/04/2015) 30 tablet 0  . HYDROcodone-acetaminophen (NORCO/VICODIN) 5-325 MG per tablet Take 1-2 tablets by mouth every 4 (four) hours as needed for moderate pain. (Patient not taking: Reported on 01/04/2015) 40 tablet 0  . oxyCODONE-acetaminophen (PERCOCET/ROXICET) 5-325 MG per tablet Take 1-2 tablets by mouth every 4 (four) hours as needed for severe pain. (Patient not taking: Reported on 01/04/2015) 40 tablet 0  . sertraline (ZOLOFT) 50 MG tablet  Take 1 tablet by mouth at bedtime.    . traMADol (ULTRAM) 50 MG tablet Take 50 mg by mouth every 6 (six) hours as needed for moderate pain.      No current facility-administered medications for this visit.   REVIEW OF SYSTEMS: Arly.Keller ] denotes positive finding; [  ] denotes negative finding  CARDIOVASCULAR:   chest pain    chest pressure    palpitations    orthopnea    dyspnea on exertion   Arly.Keller ] claudication    rest pain    DVT    phlebitis PULMONARY:    productive cough    asthma    wheezing NEUROLOGIC:   Arly.Keller ] weakness   paresthesias   aphasia   amaurosis   dizziness HEMATOLOGIC:    bleeding problems    clotting disorders MUSCULOSKELETAL:   joint pain    joint swelling  leg swelling GASTROINTESTINAL:   blood in stool    hematemesis GENITOURINARY:    dysuria    hematuria PSYCHIATRIC:   history of major depression INTEGUMENTARY:   rashes   ulcers CONSTITUTIONAL:   fever    chills  PHYSICAL EXAM: Filed Vitals:   01/04/15 1044 01/04/15 1054  BP: 187/93 179/96  Pulse: 47 46  Temp: 97.8 F (36.6 C)   TempSrc: Oral   Resp: 14   Height:  (1.651 m)   Weight: 127 lb (57.607 kg)   SpO2: 98%    GENERAL: The patient is a well-nourished female, in no acute distress. The vital signs are documented above. CARDIAC: There is a regular rate and rhythm.  VASCULAR: I do not detect carotid bruits. She has palpable femoral pulses bilaterally. I cannot palpate popliteal or pedal pulses on either side. PULMONARY: There is good air exchange bilaterally without wheezing or rales. ABDOMEN: Soft and non-tender with normal pitched bowel sounds.  MUSCULOSKELETAL: There are no major deformities or cyanosis. NEUROLOGIC: No focal weakness or paresthesias are detected. SKIN: There are no ulcers or rashes noted. PSYCHIATRIC: The patient has a normal affect.  DATA:  LOWER EXTREMITY ARTERIAL DOPPLER STUDY: I have  independently interpreted her lower extremity arterial Doppler study.   MEDICAL ISSUES:  ATHEROSCLEROSIS WITH BILATERAL LOWER EXTREMITY CLAUDICATION: Her symptoms in both lower extremities have progressed. She would like to pursue arteriography. I have reviewed the indications for lower extremity bypass. I have also reviewed the potential complications of surgery including but not limited to: wound healing problems, infection, graft thrombosis, limb loss, or  other unpredictable medical problems. All the patient's questions were answered and they are agreeable to proceed. We have also discussed the importance of tobacco cessation. She is on aspirin and is on a statin.  POSTPRANDIAL ABDOMINAL PAIN: I have explained that we will also evaluate her mesenteric arteries at the time of her arteriogram in order to evaluate her postprandial abdominal pain.  HYPERTENSION: The patient's initial blood pressure today was elevated. We repeated this and this was still elevated. We have encouraged the patient to follow up with their primary care physician for management of their blood pressure.  Waverly Ferrari [facility] [place] Beeper: [phone]

## 2015-01-17 ENCOUNTER — Encounter (HOSPITAL_COMMUNITY): Payer: Self-pay | Admitting: Vascular Surgery

## 2015-01-17 ENCOUNTER — Telehealth: Payer: Self-pay | Admitting: Vascular Surgery

## 2015-01-17 NOTE — Telephone Encounter (Signed)
Spoke with pt to set up [facility] appt and [facility] appt, dpm

## 2015-01-17 NOTE — Telephone Encounter (Signed)
-----   Message from Sharee Pimple, RN sent at 01/16/2015  9:23 AM EDT ----- Regarding: Schedule   ----- Message -----    From: Barnett Hatter    Sent: 01/16/2015   8:50 AM      To: Barnett Hatter, [facility] Charge Pool Subject: FW: charge and f/u---Kay's log                   ----- Message -----    From: Chuck Hint, MD    Sent: 01/16/2015   8:30 AM      To: Vvs Charge Pool Subject: charge and f/u                                 PROCEDURE:  1. Ultrasound-guided access to the right common femoral artery 2. Aortogram with bilateral iliac arteriogram and bilateral lower extremity runoff  SURGEON: Di Kindle. Edilia Bo, MD, FACS  CLINICAL NOTE: I will see her back in the office to discuss possible staged bilateral femoropopliteal bypass grafts versus continued conservative treatment including a structured walking program and tobacco cessation. I have discussed with her the option of starting vascular rehabilitation which she is interested in. Given her cardiac history we will try to get clearance from Dr. Donato Schultz  I need to see her back in the office in 2-3 weeks. Thank you. CD

## 2015-01-31 ENCOUNTER — Telehealth (HOSPITAL_COMMUNITY): Payer: Self-pay | Admitting: Radiology

## 2015-01-31 ENCOUNTER — Telehealth (HOSPITAL_COMMUNITY): Payer: Self-pay

## 2015-01-31 ENCOUNTER — Encounter: Payer: Self-pay | Admitting: Cardiology

## 2015-01-31 ENCOUNTER — Ambulatory Visit (INDEPENDENT_AMBULATORY_CARE_PROVIDER_SITE_OTHER): Payer: Medicare Other | Admitting: Cardiology

## 2015-01-31 VITALS — BP 134/82 | HR 78 | Ht 65.0 in | Wt 128.2 lb

## 2015-01-31 DIAGNOSIS — Z01810 Encounter for preprocedural cardiovascular examination: Secondary | ICD-10-CM | POA: Insufficient documentation

## 2015-01-31 DIAGNOSIS — I739 Peripheral vascular disease, unspecified: Secondary | ICD-10-CM | POA: Insufficient documentation

## 2015-01-31 DIAGNOSIS — Z720 Tobacco use: Secondary | ICD-10-CM

## 2015-01-31 DIAGNOSIS — F17200 Nicotine dependence, unspecified, uncomplicated: Secondary | ICD-10-CM

## 2015-01-31 DIAGNOSIS — Z87891 Personal history of nicotine dependence: Secondary | ICD-10-CM | POA: Insufficient documentation

## 2015-01-31 DIAGNOSIS — E785 Hyperlipidemia, unspecified: Secondary | ICD-10-CM | POA: Insufficient documentation

## 2015-01-31 DIAGNOSIS — F17201 Nicotine dependence, unspecified, in remission: Secondary | ICD-10-CM | POA: Insufficient documentation

## 2015-01-31 NOTE — Patient Instructions (Signed)
Medication Instructions:  The current medical regimen is effective;  continue present plan and medications.  Testing/Procedures: Your physician has requested that you have a lexiscan myoview. For further information please visit [email or web address]. Please follow instruction sheet, as given.  Follow-Up: Follow up in 6 months with Dr. Anne FuSkains.  You will receive a letter in the mail 2 months before you are due.  Please call us when you receive this letter to schedule your follow up appointment.  Thank you for choosing [facility]!!

## 2015-01-31 NOTE — Telephone Encounter (Signed)
Left message on voicemail in reference to upcoming appointment scheduled for 02-01-2015. Phone number given for a call back so details instructions can be given. Beverly Li A   

## 2015-01-31 NOTE — Progress Notes (Signed)
[address] [place], [place]  [address] Phone: [phone] Fax:  [phone]  Date:  01/31/2015   ID:  Beverly Li, DOB Apr 28, 1945, MRN [id number]  PCP:  Beverly Fiedler, MD   History of Present Illness: Beverly Li is a 68 y.o. female with hypertrophic cardiomyopathy And severe peripheral vascular disease here for preoperative risk stratification prior to staged bilateral femoral-popliteal bypass grafts versus continued conservative management at the request of Beverly Li of vascular surgery.  She's been having difficulty with walking, claudication Beverly Li is been encouraged to have a structured walking program as well as tobacco cessation.  She does live in a two-story house and she is able to traverse the steroid without significant anginal symptoms. She does however have limitation claudication specially when walking the dog. She has recently  undergone back surgery with Beverly Li.  Previous event monitor on 09/22/12 demonstrated no ventricular tachycardia, no adverse arrhythmias. No SVT. In the past, she has had a stress test which was overall low risk with no arrhythmias. A prior monitor did show a brief run of NSVT in the past. Last monitor caused blistering.   On 09/19/12 she did go to the emergency room with chest burning. EKG was performed which showed ST segment changes especially in the lateral leads. An old one was not able to be compared in the emergency department but this is no change from prior. This was secondary to hypertrophic cardiomyopathy. She did not wish to stay in the [facility] for observation. Her troponin was negative. GERD. Dexilant. "has saved her life".      Wt Readings from Last 3 Encounters:  01/16/15 120 lb (54.432 kg)  01/04/15 127 lb (57.607 kg)  02/10/14 116 lb (52.617 kg)     Past Medical History  Diagnosis Date  . Atrial arrhythmia   . Hypertension   . Insomnia   . Systolic murmur   . Atrial septal hypertrophy     ,  proximal, without significant gradient  . Mild aortic sclerosis (HCC)   . Osteopenia   . Syncope     , Vagal  . Diverticulosis   . Dysrhythmia     afib  . GERD (gastroesophageal reflux disease)   . History of kidney stones   . Hypertrophic cardiomyopathy (HCC)     severe LV basilar hypertrophy witn no evidence of significant outflow tract obstruction, EF 65-70%, mild LAE, mild TR, grade 1a diastolic dysfunction 05/15/10 (Beverly Li)  . Atrial fibrillation (HCC)   . Peripheral vascular disease Beverly Li)     Past Surgical History  Procedure Laterality Date  . Hernia repair    . Breast enhancement surgery Bilateral   . Inguinal hernia repair Bilateral   . Facial cosmetic surgery    . Left ovarian cyst    . Tonsillectomy    . Peripheral vascular catheterization N/A 01/16/2015    Procedure: Abdominal Aortogram;  Surgeon: Beverly Hint, MD;  Location: [facility];  Service: Cardiovascular;  Laterality: N/A;    Current Outpatient Prescriptions  Medication Sig Dispense Refill  . amLODipine (NORVASC) 10 MG tablet Take 5 mg by mouth daily.    [name] aspirin 325 MG tablet Take 325 mg by mouth 2 (two) times daily.     [name] buPROPion (WELLBUTRIN XL) 150 MG 24 hr tablet TK 1 T PO IN THE MORNING ONCE A DAY  0  . DEXILANT 30 MG capsule TK ONE C PO QD  4  .  esomeprazole (NEXIUM) 40 MG capsule Take 40 mg by mouth daily as needed (acid reflux).    [name]. losartan (COZAAR) 100 MG tablet Take 100 mg by mouth daily.    . Multiple Vitamin (MULTIVITAMIN WITH MINERALS) TABS Take 1 tablet by mouth at bedtime.    . Multiple Vitamins-Iron (STRESS B COMPLEX/IRON) TABS Take 1 tablet by mouth daily.    . nebivolol (BYSTOLIC) 10 MG tablet Take 10 mg by mouth at bedtime.    . rosuvastatin (CRESTOR) 5 MG tablet Take 5 mg by mouth daily.    . Sertraline HCl (ZOLOFT PO) Take 1 tablet by mouth daily. Patient unsure of dosage.    [name]. zolpidem (AMBIEN) 10 MG tablet Take 10 mg by mouth at bedtime as needed for  sleep.     No current facility-administered medications for this visit.    Allergies:    Allergies  Allergen Reactions  . Atenolol Cough  . Sulfa Antibiotics Nausea Only  . Codeine Rash    Social History:  The patient  reports that she quit smoking about 21 months ago. Her smoking use included Cigarettes. She has a 25 pack-year smoking history. She has never used smokeless tobacco. She reports that she drinks alcohol. She reports that she does not use illicit drugs.   ROS:  Please see the history of present illness.   Denies any fevers, chills, orthopnea, PND    PHYSICAL EXAM: VS:  There were no vitals taken for this visit. Well nourished, well developed, in no acute distress HEENT: normal Neck: no JVD Cardiac:  normal S1, S2; RRR; 2/6 systolic murmur Lungs:  clear to auscultation bilaterally, no wheezing, rhonchi or rales Abd: soft, nontender, no hepatomegaly Ext: no edema Skin: warm and dry Neuro: no focal abnormalities noted  EKG:  05/04/13: Sinus bradycardia rate 56 with LVH pattern, repolarization abnormality, T wave inversion in lateral leads.     No change from EKG on 7/14.  ASSESSMENT AND PLAN:  1.  preoperative cardiac risk evaluation- we will proceed with nuclear stress test for further risk evaluation.  With her severe peripheral vascular disease, I would like to make sure that she does not have any high risk coronary features on stress test before proceeding with possible femoropopliteal surgery with  Beverly Li. If stress test is on the low risk and of the spectrum, she may proceed with surgery. 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 During procedure, continue with adequate hydration and avoid excessive vasodilatation. 2. Hypertension- Excellent today. back on losartan. She has been increases to 100. She will be seeing Beverly Li very soon. Important to continue to treat this  aggressively. 3. Hypertrophic cardiomyopathy-continue with beta blocker. Murmur unchanged. No high-risk symptoms such as syncope. Previous monitor with no ventricular tachycardia. EKG unchanged. EKG is abnormal because of hypertrophic pattern. 4. Abnormal EKG-secondary to hypertrophic cardiomyopathy. 5. Chest pain-secondary to GERD.  Dexilant has helped significantly.  Signed, Beverly SchultzMark Sequoyah Ramone, MD [facility]  01/31/2015 9:01 AM

## 2015-01-31 NOTE — Telephone Encounter (Signed)
Patient given detailed instructions per Myocardial Perfusion Study Information Sheet for the test on 02/01/2015 at 7:30. Patient notified to arrive 15 minutes early and that it is imperative to arrive on time for appointment to keep from having the test rescheduled.  If you need to cancel or reschedule your appointment, please call the office within 24 hours of your appointment. Failure to do so may result in a cancellation of your appointment, and a $50 no show fee. Patient verbalized understanding.EHK

## 2015-02-01 ENCOUNTER — Ambulatory Visit (HOSPITAL_COMMUNITY): Payer: Medicare Other | Attending: Cardiology

## 2015-02-01 DIAGNOSIS — R0789 Other chest pain: Secondary | ICD-10-CM | POA: Diagnosis not present

## 2015-02-01 DIAGNOSIS — I739 Peripheral vascular disease, unspecified: Secondary | ICD-10-CM | POA: Diagnosis not present

## 2015-02-01 DIAGNOSIS — Z87891 Personal history of nicotine dependence: Secondary | ICD-10-CM | POA: Diagnosis not present

## 2015-02-01 DIAGNOSIS — Z01810 Encounter for preprocedural cardiovascular examination: Secondary | ICD-10-CM | POA: Diagnosis not present

## 2015-02-01 DIAGNOSIS — R55 Syncope and collapse: Secondary | ICD-10-CM | POA: Diagnosis not present

## 2015-02-01 DIAGNOSIS — I10 Essential (primary) hypertension: Secondary | ICD-10-CM | POA: Diagnosis not present

## 2015-02-01 DIAGNOSIS — R002 Palpitations: Secondary | ICD-10-CM | POA: Insufficient documentation

## 2015-02-01 LAB — MYOCARDIAL PERFUSION IMAGING
CHL CUP NUCLEAR SDS: 4
CHL CUP NUCLEAR SRS: 3
CHL CUP NUCLEAR SSS: 7
CHL CUP RESTING HR STRESS: 52 {beats}/min
CHL CUP STRESS STAGE 1 HR: 52 {beats}/min
CHL CUP STRESS STAGE 3 GRADE: 0 %
CHL CUP STRESS STAGE 3 SPEED: 0 mph
CHL CUP STRESS STAGE 4 GRADE: 0 %
CHL CUP STRESS STAGE 4 HR: 75 {beats}/min
CHL CUP STRESS STAGE 4 SPEED: 0 mph
CHL CUP STRESS STAGE 5 DBP: 69 mmHg
CHL CUP STRESS STAGE 5 HR: 74 {beats}/min
CHL CUP STRESS STAGE 5 SBP: 165 mmHg
CSEPEW: 1 METS
CSEPPHR: 75 {beats}/min
CSEPPMHR: 49 %
LHR: 0.23
LV sys vol: 36 mL
LVDIAVOL: 87 mL
NUC STRESS TID: 1.06
Stage 1 DBP: 78 mmHg
Stage 1 Grade: 0 %
Stage 1 SBP: 147 mmHg
Stage 1 Speed: 0 mph
Stage 2 Grade: 0 %
Stage 2 HR: 52 {beats}/min
Stage 2 Speed: 0 mph
Stage 3 DBP: 70 mmHg
Stage 3 HR: 65 {beats}/min
Stage 3 SBP: 144 mmHg
Stage 5 Grade: 0 %
Stage 5 Speed: 0 mph
Stage 6 DBP: 74 mmHg
Stage 6 Grade: 0 %
Stage 6 HR: 69 {beats}/min
Stage 6 SBP: 144 mmHg
Stage 6 Speed: 0 mph

## 2015-02-01 MED ORDER — REGADENOSON 0.4 MG/5ML IV SOLN
0.4000 mg | Freq: Once | INTRAVENOUS | Status: AC
  Administered 2015-02-01: 0.4 mg via INTRAVENOUS

## 2015-02-01 MED ORDER — TECHNETIUM TC 99M SESTAMIBI GENERIC - CARDIOLITE
10.3000 | Freq: Once | INTRAVENOUS | Status: AC | PRN
  Administered 2015-02-01: 10 via INTRAVENOUS

## 2015-02-01 MED ORDER — TECHNETIUM TC 99M SESTAMIBI GENERIC - CARDIOLITE
32.8000 | Freq: Once | INTRAVENOUS | Status: AC | PRN
  Administered 2015-02-01: 32.8 via INTRAVENOUS

## 2015-02-03 ENCOUNTER — Telehealth: Payer: Self-pay | Admitting: Cardiology

## 2015-02-03 ENCOUNTER — Encounter: Payer: Self-pay | Admitting: Vascular Surgery

## 2015-02-03 NOTE — Telephone Encounter (Signed)
New problem   Pt want someone to call her to go over the results of her stress. Please call pt.

## 2015-02-03 NOTE — Telephone Encounter (Signed)
I spoke with the patient. She is aware of her stress test results. Copy forwarded to Dr. Edilia Boickson and Dr. Barbaraann Barthelankins.

## 2015-02-08 ENCOUNTER — Ambulatory Visit (HOSPITAL_COMMUNITY)
Admission: RE | Admit: 2015-02-08 | Discharge: 2015-02-08 | Disposition: A | Discharge status: Home / Self Care | Payer: Medicare Other | Source: Ambulatory Visit | Attending: Vascular Surgery | Admitting: Vascular Surgery

## 2015-02-08 ENCOUNTER — Ambulatory Visit (INDEPENDENT_AMBULATORY_CARE_PROVIDER_SITE_OTHER): Payer: Medicare Other | Admitting: Vascular Surgery

## 2015-02-08 ENCOUNTER — Encounter: Payer: Self-pay | Admitting: Vascular Surgery

## 2015-02-08 VITALS — BP 113/70 | HR 60 | Ht 65.0 in | Wt 127.3 lb

## 2015-02-08 DIAGNOSIS — I739 Peripheral vascular disease, unspecified: Secondary | ICD-10-CM

## 2015-02-08 DIAGNOSIS — Z01810 Encounter for preprocedural cardiovascular examination: Secondary | ICD-10-CM

## 2015-02-08 NOTE — Progress Notes (Signed)
[facility]in Specialist of [place]  Patient name: Beverly Li MRN: [id number] DOB: Mar 11, 1946 Sex: female  REASON FOR VISIT: Peripheral vascular disease.  HPI: Beverly MeekerFrances B Li is a 69 y.o. female [age] following with bilateral lower extremity claudication. She felt that her symptoms were interfering with her lifestyle and wish to consider revascularization. For this reason she was set up for an arteriogram.  Her arteriogram on 01/16/2015 shows that on the right side there is plaque in the common femoral artery. There is moderate disease of the superficial femoral artery which is occluded at the adductor canal with reconstitution at the popliteal artery at the level of the knee. There is three-vessel runoff on the right. On the left side there is eccentric calcific plaque in the left common femoral artery. The left superficial femoral artery is occluded at its origin with reconstitution of the above-knee popliteal artery. There is three-vessel runoff on the left.  She has bilateral calf claudication which also extends to her thighs at times. Her symptoms are equal on both sides. Her symptoms are brought on by ambulation and relieved with rest. She denies any history of rest pain or history of nonhealing ulcers. She feels however that her symptoms are significantly disabling in that she is not able to travel origin joining her normal routine.  Past Medical History  Diagnosis Date  . Atrial arrhythmia   . Hypertension   . Insomnia   . Systolic murmur   . Atrial septal hypertrophy     , proximal, without significant gradient  . Mild aortic sclerosis (HCC)   . Osteopenia   . Syncope     , Vagal  . Diverticulosis   . Dysrhythmia     afib  . GERD (gastroesophageal reflux disease)   . History of kidney stones   . Hypertrophic cardiomyopathy (HCC)     severe LV basilar hypertrophy witn no evidence of significant outflow tract obstruction, EF 65-70%, mild LAE, mild TR, grade 1a  diastolic dysfunction 05/15/10 (Dr. Donato SchultzMark Skains)  . Atrial fibrillation (HCC)   . Peripheral vascular disease (HCC)     Family History  Problem Relation Age of Onset  . Liver cancer Mother   . Cancer Mother     Liver  . Hypertension Mother   . Lung cancer Father   . Cancer Father     Lung  . Breast cancer Sister   . Cancer Sister     Breast    SOCIAL HISTORY: Social History  Substance Use Topics  . Smoking status: Former Smoker -- 0.50 packs/day for 50 years    Types: Cigarettes    Quit date: 04/14/2013  . Smokeless tobacco: Never Used  . Alcohol Use: Yes     Comment: 1-2 per week    Allergies  Allergen Reactions  . Atenolol Cough  . Sulfa Antibiotics Nausea Only  . Codeine Rash    Current Outpatient Prescriptions  Medication Sig Dispense Refill  . amLODipine (NORVASC) 10 MG tablet Take 5 mg by mouth daily.    [name]. aspirin 325 MG tablet Take 325 mg by mouth 2 (two) times daily.     [name]. buPROPion (WELLBUTRIN XL) 150 MG 24 hr tablet TK 1 T PO IN THE MORNING ONCE A DAY  0  . DEXILANT 30 MG capsule TK ONE C PO QD  4  . esomeprazole (NEXIUM) 40 MG capsule Take 40 mg by mouth daily as needed (acid reflux).    [name]. losartan (COZAAR) 100 MG  tablet Take 100 mg by mouth daily.    . Multiple Vitamin (MULTIVITAMIN WITH MINERALS) TABS Take 1 tablet by mouth at bedtime.    . Multiple Vitamins-Iron (STRESS B COMPLEX/IRON) TABS Take 1 tablet by mouth daily.    . nebivolol (BYSTOLIC) 10 MG tablet Take 10 mg by mouth at bedtime.    . rosuvastatin (CRESTOR) 5 MG tablet Take 5 mg by mouth daily.    . Sertraline HCl (ZOLOFT PO) Take 1 tablet by mouth daily. Patient unsure of dosage.    [name] zolpidem (AMBIEN) 10 MG tablet Take 10 mg by mouth at bedtime as needed for sleep.     No current facility-administered medications for this visit.    REVIEW OF SYSTEMS:   denotes positive finding,  denotes negative finding Cardiac  Comments:  Chest pain or chest pressure:    Shortness of breath  upon exertion:    Short of breath when lying flat:    Irregular heart rhythm:        Vascular    Pain in calf, thigh, or hip brought on by ambulation:    Pain in feet at night that wakes you up from your sleep:     Blood clot in your veins:    Leg swelling:         Pulmonary    Oxygen at home:    Productive cough:     Wheezing:         Neurologic    Sudden weakness in arms or legs:     Sudden numbness in arms or legs:     Sudden onset of difficulty speaking or slurred speech:    Temporary loss of vision in one eye:     Problems with dizziness:         Gastrointestinal    Blood in stool:     Vomited blood:         Genitourinary    Burning when urinating:     Blood in urine:        Psychiatric    Major depression:         Hematologic    Bleeding problems:    Problems with blood clotting too easily:        Skin    Rashes or ulcers:        Constitutional    Fever or chills:      PHYSICAL EXAM: Filed Vitals:   02/08/15 1457  BP: 113/70  Pulse: 60  Height:  (1.651 m)  Weight: 127 lb 4.8 oz (57.743 kg)  SpO2: 96%    GENERAL: The patient is a well-nourished female, in no acute distress. The vital signs are documented above. CARDIAC: There is a regular rate and rhythm.  VASCULAR: I do not detect carotid bruits. She has palpable femoral pulses. I cannot palpate popliteal or pedal pulses. She has no significant lower extremity swelling. PULMONARY: There is good air exchange bilaterally without wheezing or rales. ABDOMEN: Soft and non-tender with normal pitched bowel sounds.  MUSCULOSKELETAL: There are no major deformities or cyanosis. NEUROLOGIC: No focal weakness or paresthesias are detected. SKIN: There are no ulcers or rashes noted. PSYCHIATRIC: The patient has a normal affect.  DATA:  I reviewed her arteriogram. On the right side she would require a right femoral to below-knee popliteal artery bypass. On the left side she would require a left femoral to  above-knee popliteal artery bypass.  She wanted to pursue right femoropopliteal bypass grafting so we  mapped her great saphenous vein on the right in the office today. Diameters range from 0.24-0.29 cm. Thus the vein may not be adequate for a bypass.  MEDICAL ISSUES:  DISABLING BILATERAL LOWER EXTREMITY CLAUDICATION: we have discussed conservative treatment including a structured walking program and evening trying to get her into vascular rehabilitation. She is decided that she wants to pursue revascularization. I've explained that given that her vein is small this could likely require use of a bypass graft which is associated with significantly lower patency and also risk of graft infection. She will discuss this with her husband and tentatively we'll plan on surgery on 02/24/2015 or 03/03/2015. We'll start with the right leg and she will require a right femoral to below-knee pop bypass. Hopefully her vein will be adequate but otherwise we would use a prosthetic graft.I have reviewed the indications for lower extremity bypass. I have also reviewed the potential complications of surgery including but not limited to: wound healing problems, infection, graft thrombosis, limb loss, or other unpredictable medical problems. All the patient's questions were answered and they are agreeable to proceed.   Waverly Ferrari [facility] [place] Beeper: [phone]

## 2015-02-15 ENCOUNTER — Encounter (HOSPITAL_COMMUNITY): Payer: Self-pay

## 2015-02-15 ENCOUNTER — Ambulatory Visit: Payer: 59 | Admitting: Vascular Surgery

## 2015-02-15 ENCOUNTER — Other Ambulatory Visit: Payer: Self-pay

## 2015-02-27 ENCOUNTER — Encounter (HOSPITAL_COMMUNITY): Payer: Self-pay

## 2015-02-27 ENCOUNTER — Encounter (HOSPITAL_COMMUNITY)
Admission: RE | Admit: 2015-02-27 | Discharge: 2015-02-27 | Disposition: A | Discharge status: Home / Self Care | Payer: Medicare Other | Source: Ambulatory Visit | Attending: Vascular Surgery | Admitting: Vascular Surgery

## 2015-02-27 HISTORY — DX: Anxiety disorder, unspecified: F41.9

## 2015-02-27 HISTORY — DX: Bell's palsy: G51.0

## 2015-02-27 HISTORY — DX: Unspecified osteoarthritis, unspecified site: M19.90

## 2015-02-27 LAB — URINALYSIS, ROUTINE W REFLEX MICROSCOPIC
Bilirubin Urine: NEGATIVE
GLUCOSE, UA: NEGATIVE mg/dL
Ketones, ur: NEGATIVE mg/dL
LEUKOCYTES UA: NEGATIVE
Nitrite: NEGATIVE
PROTEIN: NEGATIVE mg/dL
SPECIFIC GRAVITY, URINE: 1.022 (ref 1.005–1.030)
UROBILINOGEN UA: 1 mg/dL (ref 0.0–1.0)
pH: 5 (ref 5.0–8.0)

## 2015-02-27 LAB — TYPE AND SCREEN
ABO/RH(D): A NEG
ANTIBODY SCREEN: NEGATIVE
Weak D: POSITIVE

## 2015-02-27 LAB — CBC
HEMATOCRIT: 54.3 % — AB (ref 36.0–46.0)
Hemoglobin: 17.8 g/dL — ABNORMAL HIGH (ref 12.0–15.0)
MCH: 30.5 pg (ref 26.0–34.0)
MCHC: 32.8 g/dL (ref 30.0–36.0)
MCV: 93.1 fL (ref 78.0–100.0)
PLATELETS: 219 10*3/uL (ref 150–400)
RBC: 5.83 MIL/uL — ABNORMAL HIGH (ref 3.87–5.11)
RDW: 14 % (ref 11.5–15.5)
WBC: 8.1 10*3/uL (ref 4.0–10.5)

## 2015-02-27 LAB — SURGICAL PCR SCREEN
MRSA, PCR: NEGATIVE
STAPHYLOCOCCUS AUREUS: NEGATIVE

## 2015-02-27 LAB — COMPREHENSIVE METABOLIC PANEL
ALT: 18 U/L (ref 14–54)
AST: 23 U/L (ref 15–41)
Albumin: 3.8 g/dL (ref 3.5–5.0)
Alkaline Phosphatase: 115 U/L (ref 38–126)
Anion gap: 7 (ref 5–15)
BILIRUBIN TOTAL: 0.4 mg/dL (ref 0.3–1.2)
BUN: 33 mg/dL — AB (ref 6–20)
CHLORIDE: 108 mmol/L (ref 101–111)
CO2: 28 mmol/L (ref 22–32)
Calcium: 9.6 mg/dL (ref 8.9–10.3)
Creatinine, Ser: 0.74 mg/dL (ref 0.44–1.00)
Glucose, Bld: 97 mg/dL (ref 65–99)
POTASSIUM: 4.4 mmol/L (ref 3.5–5.1)
Sodium: 143 mmol/L (ref 135–145)
TOTAL PROTEIN: 7 g/dL (ref 6.5–8.1)

## 2015-02-27 LAB — URINE MICROSCOPIC-ADD ON

## 2015-02-27 LAB — PROTIME-INR
INR: 1.01 (ref 0.00–1.49)
PROTHROMBIN TIME: 13.5 s (ref 11.6–15.2)

## 2015-02-27 LAB — APTT: aPTT: 29 seconds (ref 24–37)

## 2015-02-27 NOTE — Pre-Procedure Instructions (Signed)
    Beverly Li  02/27/2015      [facility] [id number] - [place], [place] - [address] [place] [id number] Phone: [phone] Fax: [phone]    Your procedure is scheduled on 03-02-2015   Thursday    [facility] Ad[facility] at 5:30 A.M.   Call this number if you have problems the morning of surgery:  [phone]   Remember:  Do not eat food or drink liquids after midnight.   Take these medicines the morning of surgery with A SIP OF WATER Amlodipine(Norvasc),Tylenol if needed,Bupropion(Wellbutrin)Dexilant,Tramadol if needed   Do not wear jewelry, make-up or nail polish.  Do not wear lotions, powders, or perfumes.  You may not wear deodorant.  Do not shave 48 hours prior to surgery.     Do not bring valuables to the hospital.  [facility] is not responsible for any belongings or valuables.  Contacts, dentures or bridgework may not be worn into surgery.  Leave your suitcase in the car.  After surgery it may be brought to your room.  For patients admitted to the hospital, discharge time will be determined by your treatment team.     Special instructions:  See Attached Sheet for instructions on CHG shower  Please read over the following fact sheets that you were given. Pain Booklet, Coughing and Deep Breathing, Blood Transfusion Information and Surgical Site Infection Prevention

## 2015-03-01 MED ORDER — SODIUM CHLORIDE 0.9 % IV SOLN: INTRAVENOUS | Status: DC

## 2015-03-01 MED ORDER — DEXTROSE 5 % IV SOLN
1.5000 g | INTRAVENOUS | Status: AC
  Administered 2015-03-02: 1.5 g via INTRAVENOUS
  Filled 2015-03-01: qty 1.5

## 2015-03-01 MED ORDER — CHLORHEXIDINE GLUCONATE CLOTH 2 % EX PADS: 6.0000 | MEDICATED_PAD | Freq: Once | CUTANEOUS | Status: DC

## 2015-03-02 ENCOUNTER — Inpatient Hospital Stay (HOSPITAL_COMMUNITY)
Admission: RE | Admit: 2015-03-02 | Discharge: 2015-03-15 | DRG: 252 | Disposition: A | Discharge status: Home Health | Payer: Medicare Other | Source: Ambulatory Visit | Attending: Vascular Surgery | Admitting: Vascular Surgery

## 2015-03-02 ENCOUNTER — Inpatient Hospital Stay (HOSPITAL_COMMUNITY): Payer: Medicare Other

## 2015-03-02 ENCOUNTER — Encounter (HOSPITAL_COMMUNITY): Payer: Self-pay | Admitting: *Deleted

## 2015-03-02 ENCOUNTER — Inpatient Hospital Stay (HOSPITAL_COMMUNITY): Payer: Medicare Other | Admitting: Anesthesiology

## 2015-03-02 ENCOUNTER — Encounter (HOSPITAL_COMMUNITY)
Admission: RE | Disposition: A | Discharge status: Home Health | Payer: Self-pay | Source: Ambulatory Visit | Attending: Vascular Surgery

## 2015-03-02 DIAGNOSIS — F419 Anxiety disorder, unspecified: Secondary | ICD-10-CM | POA: Diagnosis present

## 2015-03-02 DIAGNOSIS — I422 Other hypertrophic cardiomyopathy: Secondary | ICD-10-CM | POA: Diagnosis present

## 2015-03-02 DIAGNOSIS — J440 Chronic obstructive pulmonary disease with acute lower respiratory infection: Secondary | ICD-10-CM | POA: Diagnosis not present

## 2015-03-02 DIAGNOSIS — M8580 Other specified disorders of bone density and structure, unspecified site: Secondary | ICD-10-CM | POA: Diagnosis present

## 2015-03-02 DIAGNOSIS — I110 Hypertensive heart disease with heart failure: Secondary | ICD-10-CM | POA: Diagnosis present

## 2015-03-02 DIAGNOSIS — J989 Respiratory disorder, unspecified: Secondary | ICD-10-CM

## 2015-03-02 DIAGNOSIS — A419 Sepsis, unspecified organism: Secondary | ICD-10-CM | POA: Diagnosis not present

## 2015-03-02 DIAGNOSIS — Z885 Allergy status to narcotic agent status: Secondary | ICD-10-CM

## 2015-03-02 DIAGNOSIS — E785 Hyperlipidemia, unspecified: Secondary | ICD-10-CM | POA: Diagnosis present

## 2015-03-02 DIAGNOSIS — Z452 Encounter for adjustment and management of vascular access device: Secondary | ICD-10-CM | POA: Diagnosis not present

## 2015-03-02 DIAGNOSIS — J95821 Acute postprocedural respiratory failure: Secondary | ICD-10-CM | POA: Diagnosis not present

## 2015-03-02 DIAGNOSIS — I748 Embolism and thrombosis of other arteries: Secondary | ICD-10-CM | POA: Diagnosis present

## 2015-03-02 DIAGNOSIS — Z978 Presence of other specified devices: Secondary | ICD-10-CM

## 2015-03-02 DIAGNOSIS — I5033 Acute on chronic diastolic (congestive) heart failure: Secondary | ICD-10-CM | POA: Diagnosis not present

## 2015-03-02 DIAGNOSIS — Z95828 Presence of other vascular implants and grafts: Secondary | ICD-10-CM | POA: Diagnosis not present

## 2015-03-02 DIAGNOSIS — I480 Paroxysmal atrial fibrillation: Secondary | ICD-10-CM | POA: Insufficient documentation

## 2015-03-02 DIAGNOSIS — Z7982 Long term (current) use of aspirin: Secondary | ICD-10-CM | POA: Diagnosis not present

## 2015-03-02 DIAGNOSIS — G4700 Insomnia, unspecified: Secondary | ICD-10-CM | POA: Diagnosis present

## 2015-03-02 DIAGNOSIS — J9612 Chronic respiratory failure with hypercapnia: Secondary | ICD-10-CM | POA: Diagnosis not present

## 2015-03-02 DIAGNOSIS — R570 Cardiogenic shock: Secondary | ICD-10-CM | POA: Diagnosis not present

## 2015-03-02 DIAGNOSIS — T4145XA Adverse effect of unspecified anesthetic, initial encounter: Secondary | ICD-10-CM | POA: Diagnosis not present

## 2015-03-02 DIAGNOSIS — Z87891 Personal history of nicotine dependence: Secondary | ICD-10-CM

## 2015-03-02 DIAGNOSIS — J9690 Respiratory failure, unspecified, unspecified whether with hypoxia or hypercapnia: Secondary | ICD-10-CM

## 2015-03-02 DIAGNOSIS — R1032 Left lower quadrant pain: Secondary | ICD-10-CM | POA: Diagnosis not present

## 2015-03-02 DIAGNOSIS — I421 Obstructive hypertrophic cardiomyopathy: Secondary | ICD-10-CM | POA: Diagnosis not present

## 2015-03-02 DIAGNOSIS — E876 Hypokalemia: Secondary | ICD-10-CM | POA: Diagnosis not present

## 2015-03-02 DIAGNOSIS — Z888 Allergy status to other drugs, medicaments and biological substances status: Secondary | ICD-10-CM

## 2015-03-02 DIAGNOSIS — Z880 Allergy status to penicillin: Secondary | ICD-10-CM

## 2015-03-02 DIAGNOSIS — Z9889 Other specified postprocedural states: Secondary | ICD-10-CM | POA: Diagnosis not present

## 2015-03-02 DIAGNOSIS — I4891 Unspecified atrial fibrillation: Secondary | ICD-10-CM | POA: Insufficient documentation

## 2015-03-02 DIAGNOSIS — D649 Anemia, unspecified: Secondary | ICD-10-CM | POA: Diagnosis not present

## 2015-03-02 DIAGNOSIS — R739 Hyperglycemia, unspecified: Secondary | ICD-10-CM | POA: Diagnosis not present

## 2015-03-02 DIAGNOSIS — I248 Other forms of acute ischemic heart disease: Secondary | ICD-10-CM | POA: Diagnosis present

## 2015-03-02 DIAGNOSIS — J441 Chronic obstructive pulmonary disease with (acute) exacerbation: Secondary | ICD-10-CM | POA: Diagnosis not present

## 2015-03-02 DIAGNOSIS — K219 Gastro-esophageal reflux disease without esophagitis: Secondary | ICD-10-CM | POA: Diagnosis present

## 2015-03-02 DIAGNOSIS — I70213 Atherosclerosis of native arteries of extremities with intermittent claudication, bilateral legs: Secondary | ICD-10-CM | POA: Diagnosis not present

## 2015-03-02 DIAGNOSIS — J9589 Other postprocedural complications and disorders of respiratory system, not elsewhere classified: Secondary | ICD-10-CM | POA: Diagnosis not present

## 2015-03-02 DIAGNOSIS — T8859XA Other complications of anesthesia, initial encounter: Secondary | ICD-10-CM | POA: Diagnosis not present

## 2015-03-02 DIAGNOSIS — F05 Delirium due to known physiological condition: Secondary | ICD-10-CM | POA: Diagnosis not present

## 2015-03-02 DIAGNOSIS — I700 Atherosclerosis of aorta: Secondary | ICD-10-CM | POA: Diagnosis present

## 2015-03-02 DIAGNOSIS — Y95 Nosocomial condition: Secondary | ICD-10-CM | POA: Diagnosis not present

## 2015-03-02 DIAGNOSIS — M1990 Unspecified osteoarthritis, unspecified site: Secondary | ICD-10-CM | POA: Diagnosis present

## 2015-03-02 DIAGNOSIS — I9589 Other hypotension: Secondary | ICD-10-CM | POA: Diagnosis not present

## 2015-03-02 DIAGNOSIS — Z0183 Encounter for blood typing: Secondary | ICD-10-CM

## 2015-03-02 DIAGNOSIS — I083 Combined rheumatic disorders of mitral, aortic and tricuspid valves: Secondary | ICD-10-CM | POA: Diagnosis present

## 2015-03-02 DIAGNOSIS — I70211 Atherosclerosis of native arteries of extremities with intermittent claudication, right leg: Secondary | ICD-10-CM

## 2015-03-02 DIAGNOSIS — J9601 Acute respiratory failure with hypoxia: Secondary | ICD-10-CM | POA: Diagnosis not present

## 2015-03-02 DIAGNOSIS — Z882 Allergy status to sulfonamides status: Secondary | ICD-10-CM

## 2015-03-02 DIAGNOSIS — J189 Pneumonia, unspecified organism: Secondary | ICD-10-CM | POA: Diagnosis not present

## 2015-03-02 DIAGNOSIS — J690 Pneumonitis due to inhalation of food and vomit: Secondary | ICD-10-CM | POA: Diagnosis not present

## 2015-03-02 DIAGNOSIS — K5900 Constipation, unspecified: Secondary | ICD-10-CM | POA: Diagnosis not present

## 2015-03-02 DIAGNOSIS — Z4659 Encounter for fitting and adjustment of other gastrointestinal appliance and device: Secondary | ICD-10-CM

## 2015-03-02 DIAGNOSIS — I10 Essential (primary) hypertension: Secondary | ICD-10-CM | POA: Insufficient documentation

## 2015-03-02 DIAGNOSIS — E872 Acidosis: Secondary | ICD-10-CM | POA: Diagnosis not present

## 2015-03-02 DIAGNOSIS — I2489 Other forms of acute ischemic heart disease: Secondary | ICD-10-CM | POA: Diagnosis present

## 2015-03-02 DIAGNOSIS — I739 Peripheral vascular disease, unspecified: Secondary | ICD-10-CM | POA: Diagnosis present

## 2015-03-02 DIAGNOSIS — Z01812 Encounter for preprocedural laboratory examination: Secondary | ICD-10-CM

## 2015-03-02 HISTORY — PX: ENDARTERECTOMY FEMORAL: SHX5804

## 2015-03-02 HISTORY — PX: FEMORAL-POPLITEAL BYPASS GRAFT: SHX937

## 2015-03-02 LAB — CBC
HCT: 44.6 % (ref 36.0–46.0)
HEMATOCRIT: 45.9 % (ref 36.0–46.0)
HEMOGLOBIN: 14.5 g/dL (ref 12.0–15.0)
Hemoglobin: 14.5 g/dL (ref 12.0–15.0)
MCH: 30 pg (ref 26.0–34.0)
MCH: 30.1 pg (ref 26.0–34.0)
MCHC: 31.6 g/dL (ref 30.0–36.0)
MCHC: 32.5 g/dL (ref 30.0–36.0)
MCV: 94.8 fL (ref 78.0–100.0)
MCV: 92.7 fL (ref 78.0–100.0)
PLATELETS: 212 10*3/uL (ref 150–400)
Platelets: 203 10*3/uL (ref 150–400)
RBC: 4.84 MIL/uL (ref 3.87–5.11)
RBC: 4.81 MIL/uL (ref 3.87–5.11)
RDW: 14.2 % (ref 11.5–15.5)
RDW: 14 % (ref 11.5–15.5)
WBC: 18.6 10*3/uL — AB (ref 4.0–10.5)
WBC: 15 10*3/uL — AB (ref 4.0–10.5)

## 2015-03-02 LAB — POCT I-STAT 3, ART BLOOD GAS (G3+)
ACID-BASE DEFICIT: 5 mmol/L — AB (ref 0.0–2.0)
ACID-BASE DEFICIT: 3 mmol/L — AB (ref 0.0–2.0)
Acid-base deficit: 5 mmol/L — ABNORMAL HIGH (ref 0.0–2.0)
Acid-base deficit: 1 mmol/L (ref 0.0–2.0)
Bicarbonate: 25.5 mEq/L — ABNORMAL HIGH (ref 20.0–24.0)
Bicarbonate: 24.9 mEq/L — ABNORMAL HIGH (ref 20.0–24.0)
Bicarbonate: 21.5 mEq/L (ref 20.0–24.0)
Bicarbonate: 24 mEq/L (ref 20.0–24.0)
O2 SAT: 97 %
O2 SAT: 99 %
O2 Saturation: 88 %
O2 Saturation: 92 %
PCO2 ART: 46.1 mmHg — AB (ref 35.0–45.0)
PCO2 ART: 40.3 mmHg (ref 35.0–45.0)
PH ART: 7.253 — AB (ref 7.350–7.450)
PH ART: 7.277 — AB (ref 7.350–7.450)
PH ART: 7.381 (ref 7.350–7.450)
PO2 ART: 104 mmHg — AB (ref 80.0–100.0)
Patient temperature: 98.6
Patient temperature: 98.1
TCO2: 28 mmol/L (ref 0–100)
TCO2: 27 mmol/L (ref 0–100)
TCO2: 23 mmol/L (ref 0–100)
TCO2: 25 mmol/L (ref 0–100)
pCO2 arterial: 71.1 mmHg (ref 35.0–45.0)
pCO2 arterial: 56.1 mmHg — ABNORMAL HIGH (ref 35.0–45.0)
pH, Arterial: 7.159 — CL (ref 7.350–7.450)
pO2, Arterial: 70 mmHg — ABNORMAL LOW (ref 80.0–100.0)
pO2, Arterial: 73 mmHg — ABNORMAL LOW (ref 80.0–100.0)
pO2, Arterial: 130 mmHg — ABNORMAL HIGH (ref 80.0–100.0)

## 2015-03-02 LAB — BASIC METABOLIC PANEL
ANION GAP: 8 (ref 5–15)
BUN: 10 mg/dL (ref 6–20)
CALCIUM: 8.3 mg/dL — AB (ref 8.9–10.3)
CO2: 25 mmol/L (ref 22–32)
Chloride: 106 mmol/L (ref 101–111)
Creatinine, Ser: 0.63 mg/dL (ref 0.44–1.00)
Glucose, Bld: 104 mg/dL — ABNORMAL HIGH (ref 65–99)
POTASSIUM: 3.9 mmol/L (ref 3.5–5.1)
SODIUM: 139 mmol/L (ref 135–145)

## 2015-03-02 LAB — POCT I-STAT 7, (LYTES, BLD GAS, ICA,H+H)
ACID-BASE DEFICIT: 11 mmol/L — AB (ref 0.0–2.0)
Bicarbonate: 20 mEq/L (ref 20.0–24.0)
Calcium, Ion: 1.22 mmol/L (ref 1.13–1.30)
HEMATOCRIT: 40 % (ref 36.0–46.0)
HEMOGLOBIN: 13.6 g/dL (ref 12.0–15.0)
O2 Saturation: 83 %
PH ART: 7.108 — AB (ref 7.350–7.450)
PO2 ART: 63 mmHg — AB (ref 80.0–100.0)
POTASSIUM: 4 mmol/L (ref 3.5–5.1)
SODIUM: 139 mmol/L (ref 135–145)
TCO2: 22 mmol/L (ref 0–100)
pCO2 arterial: 62.6 mmHg (ref 35.0–45.0)

## 2015-03-02 LAB — TROPONIN I
TROPONIN I: 0.04 ng/mL — AB (ref <0.031)
Troponin I: 0.03 ng/mL (ref <0.031)
Troponin I: 0.06 ng/mL — ABNORMAL HIGH (ref <0.031)

## 2015-03-02 LAB — APTT: APTT: 28 s (ref 24–37)

## 2015-03-02 SURGERY — BYPASS GRAFT FEMORAL-POPLITEAL ARTERY
Anesthesia: General | Site: Leg Upper | Laterality: Right

## 2015-03-02 MED ORDER — PHENYLEPHRINE 40 MCG/ML (10ML) SYRINGE FOR IV PUSH (FOR BLOOD PRESSURE SUPPORT)
PREFILLED_SYRINGE | INTRAVENOUS | Status: AC
  Filled 2015-03-02: qty 10

## 2015-03-02 MED ORDER — EPHEDRINE SULFATE 50 MG/ML IJ SOLN
INTRAMUSCULAR | Status: AC
  Filled 2015-03-02: qty 1

## 2015-03-02 MED ORDER — ALUM & MAG HYDROXIDE-SIMETH 200-200-20 MG/5ML PO SUSP: 15.0000 mL | ORAL | Status: DC | PRN

## 2015-03-02 MED ORDER — METOPROLOL TARTRATE 1 MG/ML IV SOLN
INTRAVENOUS | Status: AC
  Filled 2015-03-02: qty 5

## 2015-03-02 MED ORDER — ACETAMINOPHEN 650 MG RE SUPP: 325.0000 mg | RECTAL | Status: DC | PRN

## 2015-03-02 MED ORDER — PAPAVERINE HCL 30 MG/ML IJ SOLN
INTRAMUSCULAR | Status: DC | PRN
  Administered 2015-03-02: 60 mg via INTRAVENOUS

## 2015-03-02 MED ORDER — VASOPRESSIN 20 UNIT/ML IV SOLN
INTRAVENOUS | Status: AC
  Filled 2015-03-02: qty 1

## 2015-03-02 MED ORDER — ROCURONIUM BROMIDE 100 MG/10ML IV SOLN
INTRAVENOUS | Status: DC | PRN
  Administered 2015-03-02: 50 mg via INTRAVENOUS

## 2015-03-02 MED ORDER — CHLORHEXIDINE GLUCONATE 0.12% ORAL RINSE (MEDLINE KIT)
15.0000 mL | Freq: Two times a day (BID) | OROMUCOSAL | Status: DC
  Administered 2015-03-02 – 2015-03-08 (×12): 15 mL via OROMUCOSAL

## 2015-03-02 MED ORDER — VECURONIUM BROMIDE 10 MG IV SOLR
INTRAVENOUS | Status: AC
  Filled 2015-03-02: qty 10

## 2015-03-02 MED ORDER — FENTANYL CITRATE (PF) 100 MCG/2ML IJ SOLN
INTRAMUSCULAR | Status: DC | PRN
  Administered 2015-03-02: 50 ug via INTRAVENOUS
  Administered 2015-03-02: 25 ug via INTRAVENOUS
  Administered 2015-03-02: 75 ug via INTRAVENOUS
  Administered 2015-03-02 (×2): 50 ug via INTRAVENOUS

## 2015-03-02 MED ORDER — PROPOFOL 10 MG/ML IV BOLUS
INTRAVENOUS | Status: DC | PRN
  Administered 2015-03-02: 20 mg via INTRAVENOUS
  Administered 2015-03-02: 140 mg via INTRAVENOUS

## 2015-03-02 MED ORDER — LACTATED RINGERS IV SOLN
INTRAVENOUS | Status: DC
  Administered 2015-03-02 – 2015-03-06 (×3): via INTRAVENOUS

## 2015-03-02 MED ORDER — LACTATED RINGERS IV SOLN
INTRAVENOUS | Status: DC | PRN
  Administered 2015-03-02 (×2): via INTRAVENOUS

## 2015-03-02 MED ORDER — SODIUM CHLORIDE 0.9 % IV SOLN: 500.0000 mL | Freq: Once | INTRAVENOUS | Status: DC | PRN

## 2015-03-02 MED ORDER — DEXTROSE 5 % IV SOLN
1.5000 g | Freq: Two times a day (BID) | INTRAVENOUS | Status: AC
  Administered 2015-03-02 – 2015-03-03 (×2): 1.5 g via INTRAVENOUS
  Filled 2015-03-02 (×2): qty 1.5

## 2015-03-02 MED ORDER — MIDAZOLAM HCL 2 MG/2ML IJ SOLN
INTRAMUSCULAR | Status: AC
  Filled 2015-03-02: qty 2

## 2015-03-02 MED ORDER — PHENYLEPHRINE HCL 10 MG/ML IJ SOLN
0.0000 ug/min | INTRAVENOUS | Status: DC
  Administered 2015-03-02: 25 ug/min via INTRAVENOUS
  Administered 2015-03-02: 0 ug/min via INTRAVENOUS
  Administered 2015-03-03: 100 ug/min via INTRAVENOUS
  Administered 2015-03-03: 90.133 ug/min via INTRAVENOUS
  Administered 2015-03-04: 140 ug/min via INTRAVENOUS
  Administered 2015-03-05: 75 ug/min via INTRAVENOUS
  Administered 2015-03-07: 10 ug/min via INTRAVENOUS
  Filled 2015-03-02 (×8): qty 4

## 2015-03-02 MED ORDER — LIDOCAINE HCL (CARDIAC) 20 MG/ML IV SOLN
INTRAVENOUS | Status: AC
  Filled 2015-03-02: qty 5

## 2015-03-02 MED ORDER — PANTOPRAZOLE SODIUM 40 MG PO TBEC
40.0000 mg | DELAYED_RELEASE_TABLET | Freq: Every day | ORAL | Status: DC
Start: 2015-03-02 — End: 2015-03-03

## 2015-03-02 MED ORDER — LOSARTAN POTASSIUM 50 MG PO TABS: 100.0000 mg | ORAL_TABLET | Freq: Every day | ORAL | Status: DC

## 2015-03-02 MED ORDER — ALBUMIN HUMAN 5 % IV SOLN
INTRAVENOUS | Status: DC | PRN
  Administered 2015-03-02 (×3): via INTRAVENOUS

## 2015-03-02 MED ORDER — MIDAZOLAM HCL 2 MG/2ML IJ SOLN
INTRAMUSCULAR | Status: AC
Start: 2015-03-02 — End: 2015-03-02
  Filled 2015-03-02: qty 2

## 2015-03-02 MED ORDER — PHENOL 1.4 % MT LIQD: 1.0000 | OROMUCOSAL | Status: DC | PRN

## 2015-03-02 MED ORDER — POTASSIUM CHLORIDE CRYS ER 20 MEQ PO TBCR
20.0000 meq | EXTENDED_RELEASE_TABLET | Freq: Every day | ORAL | Status: AC | PRN
  Administered 2015-03-03: 40 meq via ORAL
  Filled 2015-03-02: qty 2

## 2015-03-02 MED ORDER — VECURONIUM BROMIDE 10 MG IV SOLR
INTRAVENOUS | Status: DC | PRN
  Administered 2015-03-02 (×2): 3 mg via INTRAVENOUS

## 2015-03-02 MED ORDER — ONDANSETRON HCL 4 MG/2ML IJ SOLN
INTRAMUSCULAR | Status: AC
  Filled 2015-03-02: qty 2

## 2015-03-02 MED ORDER — PHENYLEPHRINE HCL 10 MG/ML IJ SOLN
INTRAMUSCULAR | Status: DC | PRN
  Administered 2015-03-02 (×3): 80 ug via INTRAVENOUS
  Administered 2015-03-02 (×4): 40 ug via INTRAVENOUS

## 2015-03-02 MED ORDER — ZOLPIDEM TARTRATE 5 MG PO TABS: 5.0000 mg | ORAL_TABLET | Freq: Every evening | ORAL | Status: DC | PRN

## 2015-03-02 MED ORDER — ASPIRIN 325 MG PO TABS
325.0000 mg | ORAL_TABLET | Freq: Every day | ORAL | Status: DC
  Filled 2015-03-02: qty 1

## 2015-03-02 MED ORDER — ROCURONIUM BROMIDE 50 MG/5ML IV SOLN
INTRAVENOUS | Status: AC
  Filled 2015-03-02: qty 1

## 2015-03-02 MED ORDER — ONDANSETRON HCL 4 MG/2ML IJ SOLN: 4.0000 mg | Freq: Four times a day (QID) | INTRAMUSCULAR | Status: DC | PRN

## 2015-03-02 MED ORDER — METOPROLOL TARTRATE 1 MG/ML IV SOLN
INTRAVENOUS | Status: DC | PRN
  Administered 2015-03-02: 5 mg via INTRAVENOUS
  Administered 2015-03-02: 3 mg via INTRAVENOUS
  Administered 2015-03-02 (×2): 1 mg via INTRAVENOUS

## 2015-03-02 MED ORDER — DEXTROSE 5 % IV SOLN
0.5000 ug/min | INTRAVENOUS | Status: DC
  Filled 2015-03-02: qty 4

## 2015-03-02 MED ORDER — HEPARIN SODIUM (PORCINE) 1000 UNIT/ML IJ SOLN
INTRAMUSCULAR | Status: AC
  Filled 2015-03-02: qty 1

## 2015-03-02 MED ORDER — EPINEPHRINE HCL 0.1 MG/ML IJ SOSY
PREFILLED_SYRINGE | INTRAMUSCULAR | Status: DC | PRN
  Administered 2015-03-02: 10 ug via INTRAVENOUS

## 2015-03-02 MED ORDER — SODIUM CHLORIDE 0.9 % IV SOLN
0.0000 ug/h | INTRAVENOUS | Status: DC
  Administered 2015-03-02: 200 ug/h via INTRAVENOUS
  Administered 2015-03-03: 250 ug/h via INTRAVENOUS
  Administered 2015-03-03 – 2015-03-04 (×2): 50 ug/h via INTRAVENOUS
  Administered 2015-03-05: 125 ug/h via INTRAVENOUS
  Administered 2015-03-06 – 2015-03-07 (×2): 150 ug/h via INTRAVENOUS
  Filled 2015-03-02 (×7): qty 50

## 2015-03-02 MED ORDER — MORPHINE SULFATE (PF) 2 MG/ML IV SOLN: 2.0000 mg | INTRAVENOUS | Status: DC | PRN

## 2015-03-02 MED ORDER — EPINEPHRINE HCL 0.1 MG/ML IJ SOSY
PREFILLED_SYRINGE | INTRAMUSCULAR | Status: AC
  Filled 2015-03-02: qty 10

## 2015-03-02 MED ORDER — PAPAVERINE HCL 30 MG/ML IJ SOLN
INTRAMUSCULAR | Status: AC
  Filled 2015-03-02: qty 2

## 2015-03-02 MED ORDER — DOCUSATE SODIUM 100 MG PO CAPS: 100.0000 mg | ORAL_CAPSULE | Freq: Every day | ORAL | Status: DC

## 2015-03-02 MED ORDER — NEOSTIGMINE METHYLSULFATE 10 MG/10ML IV SOLN
INTRAVENOUS | Status: AC
  Filled 2015-03-02: qty 1

## 2015-03-02 MED ORDER — ARTIFICIAL TEARS OP OINT
TOPICAL_OINTMENT | OPHTHALMIC | Status: AC
  Filled 2015-03-02: qty 3.5

## 2015-03-02 MED ORDER — 0.9 % SODIUM CHLORIDE (POUR BTL) OPTIME
TOPICAL | Status: DC | PRN
  Administered 2015-03-02: 1000 mL

## 2015-03-02 MED ORDER — MIDAZOLAM HCL 2 MG/2ML IJ SOLN
1.0000 mg | INTRAMUSCULAR | Status: DC | PRN
  Administered 2015-03-02 – 2015-03-03 (×5): 2 mg via INTRAVENOUS
  Administered 2015-03-03: 4 mg via INTRAVENOUS
  Administered 2015-03-03 – 2015-03-04 (×6): 2 mg via INTRAVENOUS
  Administered 2015-03-04: 1 mg via INTRAVENOUS
  Administered 2015-03-04 – 2015-03-05 (×7): 2 mg via INTRAVENOUS
  Administered 2015-03-05 (×2): 1 mg via INTRAVENOUS
  Administered 2015-03-05 (×2): 2 mg via INTRAVENOUS
  Administered 2015-03-05 – 2015-03-06 (×6): 1 mg via INTRAVENOUS
  Administered 2015-03-06 (×3): 0.5 mg via INTRAVENOUS
  Administered 2015-03-06 (×2): 1 mg via INTRAVENOUS
  Administered 2015-03-07 (×2): 0.5 mg via INTRAVENOUS
  Filled 2015-03-02 (×7): qty 2
  Filled 2015-03-02: qty 4
  Filled 2015-03-02 (×7): qty 2
  Filled 2015-03-02: qty 4
  Filled 2015-03-02 (×14): qty 2

## 2015-03-02 MED ORDER — VASOPRESSIN 20 UNIT/ML IV SOLN
INTRAVENOUS | Status: DC | PRN
  Administered 2015-03-02 (×5): 1 [IU] via INTRAVENOUS

## 2015-03-02 MED ORDER — PHENYLEPHRINE HCL 10 MG/ML IJ SOLN
INTRAMUSCULAR | Status: AC
  Filled 2015-03-02: qty 2

## 2015-03-02 MED ORDER — FENTANYL CITRATE (PF) 100 MCG/2ML IJ SOLN
50.0000 ug | INTRAMUSCULAR | Status: DC | PRN
  Administered 2015-03-07: 25 ug via INTRAVENOUS
  Administered 2015-03-07: 50 ug via INTRAVENOUS
  Administered 2015-03-07: 25 ug via INTRAVENOUS
  Filled 2015-03-02 (×2): qty 2

## 2015-03-02 MED ORDER — MIDAZOLAM HCL 2 MG/2ML IJ SOLN
1.0000 mg | INTRAMUSCULAR | Status: DC | PRN
  Administered 2015-03-02: 1 mg via INTRAVENOUS
  Filled 2015-03-02: qty 2

## 2015-03-02 MED ORDER — LIDOCAINE HCL (CARDIAC) 20 MG/ML IV SOLN
INTRAVENOUS | Status: DC | PRN
  Administered 2015-03-02: 60 mg via INTRAVENOUS

## 2015-03-02 MED ORDER — MIDAZOLAM HCL 2 MG/2ML IJ SOLN
2.0000 mg | INTRAMUSCULAR | Status: AC | PRN
  Administered 2015-03-02 (×3): 2 mg via INTRAVENOUS
  Filled 2015-03-02 (×4): qty 2

## 2015-03-02 MED ORDER — FENTANYL CITRATE (PF) 250 MCG/5ML IJ SOLN
INTRAMUSCULAR | Status: AC
  Filled 2015-03-02: qty 5

## 2015-03-02 MED ORDER — TRAMADOL HCL 50 MG PO TABS: 50.0000 mg | ORAL_TABLET | Freq: Four times a day (QID) | ORAL | Status: DC | PRN

## 2015-03-02 MED ORDER — PROPOFOL 10 MG/ML IV BOLUS
INTRAVENOUS | Status: AC
  Filled 2015-03-02: qty 40

## 2015-03-02 MED ORDER — SODIUM CHLORIDE 0.9 % IV BOLUS (SEPSIS)
250.0000 mL | Freq: Once | INTRAVENOUS | Status: AC
  Administered 2015-03-02: 250 mL via INTRAVENOUS

## 2015-03-02 MED ORDER — STRESS B COMPLEX/IRON PO TABS: 1.0000 | ORAL_TABLET | Freq: Every day | ORAL | Status: DC

## 2015-03-02 MED ORDER — MIDAZOLAM HCL 2 MG/2ML IJ SOLN
INTRAMUSCULAR | Status: AC
  Administered 2015-03-02: 2 mg
  Filled 2015-03-02: qty 2

## 2015-03-02 MED ORDER — ANTISEPTIC ORAL RINSE SOLUTION (CORINZ)
7.0000 mL | Freq: Four times a day (QID) | OROMUCOSAL | Status: DC
  Administered 2015-03-03 – 2015-03-08 (×22): 7 mL via OROMUCOSAL

## 2015-03-02 MED ORDER — HEPARIN SODIUM (PORCINE) 1000 UNIT/ML IJ SOLN
INTRAMUSCULAR | Status: DC | PRN
  Administered 2015-03-02: 6000 [IU] via INTRAVENOUS
  Administered 2015-03-02: 2000 [IU] via INTRAVENOUS

## 2015-03-02 MED ORDER — HEPARIN (PORCINE) IN NACL 100-0.45 UNIT/ML-% IJ SOLN
400.0000 [IU]/h | INTRAMUSCULAR | Status: DC
  Administered 2015-03-02: 400 [IU]/h via INTRAVENOUS
  Filled 2015-03-02 (×2): qty 250

## 2015-03-02 MED ORDER — OXYCODONE HCL 5 MG PO TABS: 5.0000 mg | ORAL_TABLET | ORAL | Status: DC | PRN

## 2015-03-02 MED ORDER — PROTAMINE SULFATE 10 MG/ML IV SOLN
INTRAVENOUS | Status: DC | PRN
  Administered 2015-03-02 (×4): 10 mg via INTRAVENOUS

## 2015-03-02 MED ORDER — SODIUM CHLORIDE 0.9 % IJ SOLN
INTRAMUSCULAR | Status: AC
  Filled 2015-03-02: qty 10

## 2015-03-02 MED ORDER — B COMPLEX-C PO TABS
1.0000 | ORAL_TABLET | Freq: Every day | ORAL | Status: DC
  Administered 2015-03-03 – 2015-03-15 (×13): 1 via ORAL
  Filled 2015-03-02 (×14): qty 1

## 2015-03-02 MED ORDER — ROSUVASTATIN CALCIUM 5 MG PO TABS
5.0000 mg | ORAL_TABLET | Freq: Every day | ORAL | Status: DC
  Administered 2015-03-03 – 2015-03-14 (×11): 5 mg via ORAL
  Filled 2015-03-02 (×14): qty 1

## 2015-03-02 MED ORDER — SODIUM CHLORIDE 0.9 % IV SOLN
INTRAVENOUS | Status: DC | PRN
  Administered 2015-03-02 (×2)

## 2015-03-02 MED ORDER — BUPROPION HCL ER (XL) 150 MG PO TB24: 150.0000 mg | ORAL_TABLET | Freq: Every day | ORAL | Status: DC

## 2015-03-02 MED ORDER — LACTATED RINGERS IV SOLN
INTRAVENOUS | Status: DC | PRN
  Administered 2015-03-02 (×5): via INTRAVENOUS

## 2015-03-02 MED ORDER — ADULT MULTIVITAMIN W/MINERALS CH
1.0000 | ORAL_TABLET | Freq: Every day | ORAL | Status: DC
  Administered 2015-03-03 – 2015-03-14 (×11): 1 via ORAL
  Filled 2015-03-02 (×16): qty 1

## 2015-03-02 MED ORDER — MIDAZOLAM HCL 5 MG/5ML IJ SOLN
INTRAMUSCULAR | Status: DC | PRN
  Administered 2015-03-02 (×4): 1 mg via INTRAVENOUS
  Administered 2015-03-02: 2 mg via INTRAVENOUS

## 2015-03-02 MED ORDER — GUAIFENESIN-DM 100-10 MG/5ML PO SYRP: 15.0000 mL | ORAL_SOLUTION | ORAL | Status: DC | PRN

## 2015-03-02 MED ORDER — NITROGLYCERIN IN D5W 200-5 MCG/ML-% IV SOLN
INTRAVENOUS | Status: DC | PRN
  Administered 2015-03-02: 5 ug/min via INTRAVENOUS

## 2015-03-02 MED ORDER — VANCOMYCIN HCL IN DEXTROSE 1-5 GM/200ML-% IV SOLN: 1000.0000 mg | Freq: Two times a day (BID) | INTRAVENOUS | Status: DC

## 2015-03-02 MED ORDER — PHENYLEPHRINE HCL 10 MG/ML IJ SOLN
10.0000 mg | INTRAMUSCULAR | Status: DC | PRN
  Administered 2015-03-02: 120 ug/min via INTRAVENOUS

## 2015-03-02 MED ORDER — THROMBIN 20000 UNITS EX SOLR
CUTANEOUS | Status: AC
Start: 2015-03-02 — End: 2015-03-02
  Filled 2015-03-02: qty 20000

## 2015-03-02 MED ORDER — GLYCOPYRROLATE 0.2 MG/ML IJ SOLN
INTRAMUSCULAR | Status: AC
  Filled 2015-03-02: qty 3

## 2015-03-02 MED ORDER — PROTAMINE SULFATE 10 MG/ML IV SOLN
INTRAVENOUS | Status: AC
  Filled 2015-03-02: qty 5

## 2015-03-02 MED ORDER — MAGNESIUM SULFATE 2 GM/50ML IV SOLN
2.0000 g | Freq: Every day | INTRAVENOUS | Status: AC | PRN
  Administered 2015-03-03: 2 g via INTRAVENOUS
  Filled 2015-03-02: qty 50

## 2015-03-02 MED ORDER — ACETAMINOPHEN 325 MG PO TABS: 325.0000 mg | ORAL_TABLET | ORAL | Status: DC | PRN

## 2015-03-02 MED ORDER — SODIUM CHLORIDE 0.9 % IV SOLN
INTRAVENOUS | Status: DC
Start: 2015-03-02 — End: 2015-03-15
  Administered 2015-03-02: 13:00:00 via INTRAVENOUS

## 2015-03-02 MED ORDER — EPHEDRINE SULFATE 50 MG/ML IJ SOLN
INTRAMUSCULAR | Status: DC | PRN
  Administered 2015-03-02: 10 mg via INTRAVENOUS
  Administered 2015-03-02 (×3): 5 mg via INTRAVENOUS

## 2015-03-02 MED ORDER — ONDANSETRON HCL 4 MG/2ML IJ SOLN
INTRAMUSCULAR | Status: DC | PRN
  Administered 2015-03-02: 4 mg via INTRAVENOUS

## 2015-03-02 MED ORDER — FENTANYL CITRATE (PF) 100 MCG/2ML IJ SOLN
100.0000 ug | INTRAMUSCULAR | Status: DC | PRN
  Administered 2015-03-02 (×2): 100 ug via INTRAVENOUS
  Filled 2015-03-02 (×3): qty 2

## 2015-03-02 SURGICAL SUPPLY — 58 items
BANDAGE ELASTIC 4 VELCRO ST LF (GAUZE/BANDAGES/DRESSINGS) ×2 IMPLANT
BANDAGE ESMARK 6X9 LF (GAUZE/BANDAGES/DRESSINGS) IMPLANT
BLADE SURG 11 STRL SS (BLADE) ×2 IMPLANT
BNDG CMPR 9X6 STRL LF SNTH (GAUZE/BANDAGES/DRESSINGS) ×2
BNDG ESMARK 6X9 LF (GAUZE/BANDAGES/DRESSINGS) ×4
CANISTER SUCTION 2500CC (MISCELLANEOUS) ×4 IMPLANT
CANNULA VESSEL 3MM 2 BLNT TIP (CANNULA) ×8 IMPLANT
CLIP TI MEDIUM 24 (CLIP) ×4 IMPLANT
CLIP TI WIDE RED SMALL 24 (CLIP) ×10 IMPLANT
CUFF TOURNIQUET SINGLE 24IN (TOURNIQUET CUFF) ×2 IMPLANT
DRAIN CHANNEL 19F RND (DRAIN) ×4 IMPLANT
DRAPE UNIVERSAL PACK (DRAPES) ×2 IMPLANT
DRSG COVADERM 4X14 (GAUZE/BANDAGES/DRESSINGS) ×2 IMPLANT
DRSG COVADERM 4X8 (GAUZE/BANDAGES/DRESSINGS) ×4 IMPLANT
ELECT REM PT RETURN 9FT ADLT (ELECTROSURGICAL) ×4
ELECTRODE REM PT RTRN 9FT ADLT (ELECTROSURGICAL) ×2 IMPLANT
EVACUATOR SILICONE 100CC (DRAIN) ×4 IMPLANT
GAUZE SPONGE 4X4 16PLY XRAY LF (GAUZE/BANDAGES/DRESSINGS) ×4 IMPLANT
GLOVE BIO SURGEON STRL SZ 6.5 (GLOVE) ×2 IMPLANT
GLOVE BIO SURGEON STRL SZ7.5 (GLOVE) ×4 IMPLANT
GLOVE BIO SURGEONS STRL SZ 6.5 (GLOVE) ×2
GLOVE BIOGEL PI IND STRL 6.5 (GLOVE) IMPLANT
GLOVE BIOGEL PI IND STRL 7.0 (GLOVE) IMPLANT
GLOVE BIOGEL PI IND STRL 8 (GLOVE) ×2 IMPLANT
GLOVE BIOGEL PI INDICATOR 6.5 (GLOVE) ×6
GLOVE BIOGEL PI INDICATOR 7.0 (GLOVE) ×6
GLOVE BIOGEL PI INDICATOR 8 (GLOVE) ×2
GLOVE ECLIPSE 6.5 STRL STRAW (GLOVE) ×2 IMPLANT
GLOVE ECLIPSE 7.5 STRL STRAW (GLOVE) ×2 IMPLANT
GLOVE SURG SS PI 6.5 STRL IVOR (GLOVE) ×6 IMPLANT
GOWN STRL REUS W/ TWL LRG LVL3 (GOWN DISPOSABLE) ×6 IMPLANT
GOWN STRL REUS W/TWL LRG LVL3 (GOWN DISPOSABLE) ×16
KIT BASIN OR (CUSTOM PROCEDURE TRAY) ×4 IMPLANT
KIT ROOM TURNOVER OR (KITS) ×4 IMPLANT
LOOP VESSEL MAXI BLUE (MISCELLANEOUS) ×2 IMPLANT
NDL 18GX1X1/2 (RX/OR ONLY) (NEEDLE) IMPLANT
NEEDLE 18GX1X1/2 (RX/OR ONLY) (NEEDLE) ×4 IMPLANT
NS IRRIG 1000ML POUR BTL (IV SOLUTION) ×10 IMPLANT
PACK PERIPHERAL VASCULAR (CUSTOM PROCEDURE TRAY) ×4 IMPLANT
PAD ARMBOARD 7.5X6 YLW CONV (MISCELLANEOUS) ×8 IMPLANT
SPONGE GAUZE 4X4 12PLY STER LF (GAUZE/BANDAGES/DRESSINGS) ×4 IMPLANT
SPONGE SURGIFOAM ABS GEL 100 (HEMOSTASIS) IMPLANT
STAPLER VISISTAT (STAPLE) ×4 IMPLANT
SUT PROLENE 5 0 C 1 24 (SUTURE) ×12 IMPLANT
SUT PROLENE 6 0 BV (SUTURE) ×10 IMPLANT
SUT SILK 2 0 FS (SUTURE) ×4 IMPLANT
SUT SILK 2 0 SH (SUTURE) ×4 IMPLANT
SUT SILK 3 0 (SUTURE) ×8
SUT SILK 3-0 18XBRD TIE 12 (SUTURE) IMPLANT
SUT VIC AB 2-0 CTB1 (SUTURE) ×8 IMPLANT
SUT VIC AB 3-0 SH 27 (SUTURE) ×20
SUT VIC AB 3-0 SH 27X BRD (SUTURE) ×4 IMPLANT
SUT VICRYL 4-0 PS2 18IN ABS (SUTURE) ×8 IMPLANT
SYRINGE 3CC LL L/F (MISCELLANEOUS) ×2 IMPLANT
TAPE UMBILICAL COTTON 1/8X30 (MISCELLANEOUS) ×2 IMPLANT
TRAY FOLEY W/METER SILVER 16FR (SET/KITS/TRAYS/PACK) ×4 IMPLANT
UNDERPAD 30X30 INCONTINENT (UNDERPADS AND DIAPERS) ×4 IMPLANT
WATER STERILE IRR 1000ML POUR (IV SOLUTION) ×4 IMPLANT

## 2015-03-02 NOTE — Anesthesia Postprocedure Evaluation (Signed)
  Anesthesia Post-op Note  Patient: Beverly Li  Procedure(s) Performed: Procedure(s): BYPASS GRAFT FEMORAL-BELOW KNEE POPLITEAL ARTERY (Right) ENDARTERECTOMY RIGHT FEMORAL (Right)  Patient Location: ICU  Anesthesia Type:General  Level of Consciousness: Patient remains intubated per anesthesia plan  Airway and Oxygen Therapy: Patient remains intubated per anesthesia plan and Patient placed on Ventilator (see vital sign flow sheet for setting)  Post-op Pain: none  Post-op Assessment: Post-op Vital signs reviewed, PATIENT'S CARDIOVASCULAR STATUS UNSTABLE, RESPIRATORY FUNCTION UNSTABLE and No signs of Nausea or vomiting              Post-op Vital Signs: Reviewed and unstable  Last Vitals:  Filed Vitals:   03/02/15 1300  BP: 156/75  Pulse: 60  Temp:   Resp: 14    Complications: No apparent anesthesia complications and cardiovascular complications

## 2015-03-02 NOTE — Progress Notes (Signed)
   VASCULAR SURGERY POST OP NOTE:  * Currently hemodynamically stable, but when BP drifts down, it can then drop further fairly quickly. Carefully ween Neo  *  Greatly appreciate Dr. Anne FuSkains' and Dr. Jane Canaryamaswamy's help.  SUBJECTIVE: Sedated on vent  PHYSICAL EXAM: Filed Vitals:   03/02/15 0600 03/02/15 1250 03/02/15 1300 03/02/15 1340  BP: 171/85 171/70 156/75   Pulse: 92 67 60 70  Temp: 97.7 F (36.5 C)  97.5 F (36.4 C)   TempSrc:   Oral   Resp: 16 16 14 14   SpO2: 97% 93% 94% 90%   Good doppler flow right foot. JP's with min drainage.  LABS: Lab Results  Component Value Date   WBC 18.6* 03/02/2015   HGB 14.5 03/02/2015   HCT 45.9 03/02/2015   MCV 94.8 03/02/2015   PLT 203 03/02/2015   Lab Results  Component Value Date   CREATININE 0.74 02/27/2015   Lab Results  Component Value Date   INR 1.01 02/27/2015    Active Problems:   PAD (peripheral artery disease) (HCC)   Acute respiratory failure with hypoxia [facility](HCC)   Cari CarawayChris Rylee Nuzum Beeper: [phone]: [phone] 03/02/2015

## 2015-03-02 NOTE — Progress Notes (Signed)
Dr. Anne FuSkains returned to the bedside.  Decision was made to maintain pt on neosynephrine at 2150mcg/min and to follow-up with Dr. Marchelle Gearingamaswamy for possible continuous sedation to prevent pt from vagaling as she awakens with ETT.  Dr. Marchelle Gearingamaswamy was contacted and provided telephone orders for a fentanyl drip for a Rass goal of --2.

## 2015-03-02 NOTE — Consult Note (Signed)
PULMONARY / CRITICAL CARE MEDICINE   Name: Beverly Li MRN: [id number] DOB: 10-19-1945    ADMISSION DATE:  03/02/2015 CONSULTATION DATE:  03/02/2015   REFERRING MD :  Dr Edilia Bo [facility]  CHIEF COMPLAINT:  Acute post op resp failure   HISTORY OF PRESENT ILLNESS:  51 yearo old female with baseline claudication but no dyspnea but has some HOCM (followed by Dr Anne Fu) but at baseline and has had a normal cardiac stress test and is able to climb stairs without dyspnea. He underwent on 03/02/2015 right below knee popliteal artery femoropopliteal bypass and femoral artery endarterectomy on the right. Overall operative time was approximately 4 hours according to anesthesia. First 2 hours no problem but subsequently started having severe evidence of hypertrophic obstructive cardiomyopathy as evidenced by intraoperative echo and periods of hypotension. Status post 4-5 liters of fluid and for albumin infusions. Brought into the intensive care unit postoperatively and was spontaneously weaned off Neo-Synephrine but was found to be in respiratory acidosis with severe hypoxemia and chest x-ray with acute pulmonary edema bilaterally. Pulmonary critical care called for consultation.  PAST MEDICAL HISTORY :   has a past medical history of Atrial arrhythmia; Hypertension; Insomnia; Systolic murmur; Atrial septal hypertrophy; Mild aortic sclerosis (HCC); Osteopenia; Syncope; Dysrhythmia; GERD (gastroesophageal reflux disease); History of kidney stones; Hypertrophic cardiomyopathy (HCC); Atrial fibrillation (HCC); Peripheral vascular disease (HCC); Anxiety; Arthritis; and Bell's palsy.  has past surgical history that includes Hernia repair; Breast enhancement surgery (Bilateral); Inguinal hernia repair (Bilateral); Facial cosmetic surgery; left ovarian cyst; Tonsillectomy; and Cardiac catheterization (N/A, 01/16/2015). Prior to Admission medications   Medication Sig Start Date End Date Taking? Authorizing Provider   acetaminophen (TYLENOL) 500 MG tablet Take 1,000 mg by mouth every 8 (eight) hours as needed for mild pain or moderate pain.   Yes Historical Provider, MD  amLODipine (NORVASC) 10 MG tablet Take 5 mg by mouth daily.   Yes Historical Provider, MD  aspirin 325 MG tablet Take 325 mg by mouth 2 (two) times daily.    Yes Historical Provider, MD  buPROPion (WELLBUTRIN XL) 150 MG 24 hr tablet TK 1 T PO IN THE MORNING ONCE A DAY 12/20/14  Yes Historical Provider, MD  DEXILANT 30 MG capsule TAKE ONE CAPSULE BY MOUTH  EVERYDAY 01/17/15  Yes Historical Provider, MD  losartan (COZAAR) 100 MG tablet Take 100 mg by mouth daily.   Yes Historical Provider, MD  Multiple Vitamin (MULTIVITAMIN WITH MINERALS) TABS Take 1 tablet by mouth at bedtime.   Yes Historical Provider, MD  Multiple Vitamins-Iron (STRESS B COMPLEX/IRON) TABS Take 1 tablet by mouth daily.   Yes Historical Provider, MD  nebivolol (BYSTOLIC) 10 MG tablet Take 10 mg by mouth at bedtime.   Yes Historical Provider, MD  rosuvastatin (CRESTOR) 5 MG tablet Take 5 mg by mouth daily.   Yes Historical Provider, MD  traMADol (ULTRAM) 50 MG tablet Take 50 mg by mouth every 6 (six) hours as needed for moderate pain.   Yes Historical Provider, MD  zolpidem (AMBIEN) 10 MG tablet Take 10 mg by mouth at bedtime as needed for sleep.   Yes Historical Provider, MD   Allergies  Allergen Reactions  . Amoxicillin Other (See Comments)    UTI  . Atenolol Cough  . Sulfa Antibiotics Nausea Only  . Codeine Rash    FAMILY HISTORY:  indicated that her mother is deceased. She indicated that her father is deceased. She indicated that her sister is alive. She indicated that her brother  is alive. She indicated that her maternal grandmother is deceased. She indicated that her maternal grandfather is deceased. She indicated that her paternal grandmother is deceased. She indicated that her paternal grandfather is deceased.  SOCIAL HISTORY:  reports that she quit smoking about 22  months ago. Her smoking use included Cigarettes. She has a 25 pack-year smoking history. She has never used smokeless tobacco. She reports that she drinks alcohol. She reports that she does not use illicit drugs.  REVIEW OF SYSTEMS:  According to history of present illness  VITAL SIGNS: Temp:  [97.5 F (36.4 C)-97.7 F (36.5 C)] 97.5 F (36.4 C) (11/17 1300) Pulse Rate:  [60-92] 70 (11/17 1340) Resp:  [14-16] 14 (11/17 1340) BP: (156-171)/(70-85) 156/75 mmHg (11/17 1300) SpO2:  [90 %-97 %] 90 % (11/17 1340) Arterial Line BP: (132-179)/(55-68) 132/55 mmHg (11/17 1340) FiO2 (%):  [100 %] 100 % (11/17 1250) HEMODYNAMICS:   VENTILATOR SETTINGS: Vent Mode:  [-] PRVC FiO2 (%):  [100 %] 100 % Set Rate:  [16 bmp-28 bmp] 28 bmp Vt Set:  [500 mL] 500 mL PEEP:  [5 cmH20-8 cmH20] 8 cmH20 INTAKE / OUTPUT:  Intake/Output Summary (Last 24 hours) at 03/02/15 1417 Last data filed at 03/02/15 1250  Gross per 24 hour  Intake   7050 ml  Output    275 ml  Net   6775 ml    PHYSICAL EXAMINATION: General:  Female other ventilator looks critically ill Neuro:  RA SS sedation score -3 on when necessary sedation HEENT:  And her tracheal tube present equal air entry neck is supple Cardiovascular:  Normal heart sounds. Normal blood pressure. Lungs:  Synchronous with the ventilator. Occasional crackles present Abdomen:  Soft nontender nor megaly Musculoskeletal:  Right distal Band-Aid present Skin:  Intact on exposed areas    PULMONARY  Recent Labs Lab 03/02/15 1227  PHART 7.108*  PCO2ART 62.6*  PO2ART 63.0*  HCO3 20.0  TCO2 22  O2SAT 83.0    CBC  Recent Labs Lab 02/27/15 1128 03/02/15 1227  HGB 17.8* 13.6  HCT 54.3* 40.0  WBC 8.1  --   PLT 219  --     COAGULATION  Recent Labs Lab 02/27/15 1128  INR 1.01    CARDIAC   Recent Labs Lab 03/02/15 1055  TROPONINI 0.03   No results for input(s): PROBNP in the last 168 hours.   CHEMISTRY  Recent Labs Lab  02/27/15 1128 03/02/15 1227  NA 143 139  K 4.4 4.0  CL 108  --   CO2 28  --   GLUCOSE 97  --   BUN 33*  --   CREATININE 0.74  --   CALCIUM 9.6  --    Estimated Creatinine Clearance: 59.7 mL/min (by C-G formula based on Cr of 0.74).   LIVER  Recent Labs Lab 02/27/15 1128  AST 23  ALT 18  ALKPHOS 115  BILITOT 0.4  PROT 7.0  ALBUMIN 3.8  INR 1.01     INFECTIOUS No results for input(s): LATICACIDVEN, PROCALCITON in the last 168 hours.   ENDOCRINE CBG (last 3)  No results for input(s): GLUCAP in the last 72 hours.       IMAGING x48h  - image(s) personally visualized  -   highlighted in bold Dg Chest Port 1 View  03/02/2015  CLINICAL DATA:  Recent intubation, status post femoral popliteal surgery EXAM: PORTABLE CHEST - 1 VIEW COMPARISON:  09/19/2012 FINDINGS: The cardiac shadow is stable. Diffuse vascular congestion is seen. Calcified  breast implant is noted on the right. The left breast implant is not calcified. Interstitial and alveolar edema is noted. Endotracheal tube is seen in satisfactory position approximately 4.3 cm above the carina. No bony abnormality is noted. IMPRESSION: Vascular congestion and pulmonary edema predominately of an interstitial type. An endotracheal tube is noted in satisfactory position. Electronically Signed   By: Alcide Clever M.D.   On: 03/02/2015 13:49       ASSESSMENT / PLAN:  PULMONARY OETT 03/02/2015>> A: Acute postoperative respiratory failure due to flareup of hypertrophic objective cardiomyopathy intraoperatively with associated pulmonary edema P:   Full ventilator support with high PEEP and minute ventilation Check abg  CARDIOVASCULAR CVLc x A: - Baseline hypertrophic obstructive cardiomyopathy. Reported as mild, intraoperative flareup due to nothing by mouth status and vasodilatation with anesthesia. Complicated by fluid overload and pulmonary edema during intraoperative resuscitation.  P:  Mean arterial pressure goal  greater than 65 - use Neo-Synephrine Avoid dopamine, dobutamine and levo fed Allow for spontaneous diuresis  Vascular A: Status post right femoropopliteal bypass P - The vascular service - IV heparin for vascular service  RENAL A:  At risk for renal injury P:   Monitor  GASTROINTESTINAL A:  Nothing by mouth P:   NG tube  HEMATOLOGIC A:  At risk for anemia of critical illness P:  Monitor IV heparin and SCD  INFECTIOUS A:  No evidence of infection P:    Monitor  ENDOCRINE A:  At risk for hyperglycemia   P:   Monitor  NEUROLOGIC A:  Postoperative pain and at risk for acute encephalopathy P:   RASS goal: 0 to -2 Fentanyl and Versed when necessary   FAMILY  - Updates: Husband updated at the bedside and discussed with Dr. Jaquelyn Bitter of cardiology at the bedside who also beta the husband  - Inter-disciplinary family meet or Palliative Care meeting due by:  03/09/2015       The patient is critically ill with multiple organ systems failure and requires high complexity decision making for assessment and support, frequent evaluation and titration of therapies, application of advanced monitoring technologies and extensive interpretation of multiple databases.   Critical Care Time devoted to patient care services described in this note is  45  Minutes. This time reflects time of care of this signee Dr Kalman Shan. This critical care time does not reflect procedure time, or teaching time or supervisory time of PA/NP/Med student/Med Resident etc but could involve care discussion time    Dr. Kalman Shan, M.D., [facility].C.P Pulmonary and Critical Care Medicine Staff Physician [facility] Pager: [phone], If no answer or between  15:00h - 7:00h: call [phone]  03/02/2015 2:17 PM

## 2015-03-02 NOTE — Op Note (Signed)
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 Right common femoral artery endarterectomy 2. Right common femoral artery to below-knee popliteal artery bypass with composite  vein graft (2 pieces right great saphenous vein spliced together)  SURGEON: Di Kindle. Edilia Bo, MD, FACS  ASSIST: Doreatha Massed, PA  ANESTHESIA: Gen.   EBL: minimal  INDICATIONS: Beverly Li is a 69 y.o. female presented with bilateral lower extremity claudication. She underwent an arteriogram which showed superficial femoral artery occlusion at the adductor canal with reconstitution of the popliteal artery at the level of the knee. Given the proximity to the knee did not think she was a candidate for an endovascular approach. We discussed conservative treatment and a structured walking program however she felt her symptoms were significantly disabling and wished to pursue staged femoropopliteal bypass grafts. She presents for a right femoropopliteal bypass graft. She did have a myocardial perfusion scan on 02/01/2015 which showed a normal ejection fraction of 55-65%. This was a normal study and low risk.  FINDINGS: the patient had a bifid saphenous system on the left and I harvested both segments and took the 2 largest segments and spliced them together for a graft to the below-knee popliteal artery. There was significant plaque within the common femoral artery and some old thrombus which was endarterectomized. During the procedure the patient had multiple episodes where her blood pressure would drop and she would have ST changes on her EKG. Intraoperative transesophageal echo was performed which showed significant hypertrophic outflow obstruction of the left ventricle and cardiology was counseled that  intraoperatively.  TECHNIQUE: The patient was taken to the operating room and received a general anesthetic. Entire right lower extremity was prepped and draped in usual sterile fashion. She did have an initial drop in her blood pressure but this responded fairly quickly to fluids. A longitudinal incision was made in the right groin and the common femoral, deep femoral, and superficial femoral arteries were dissected free. It was plaque within the common femoral artery noted. Saphenofemoral junction was dissected free and the vein weakly split into 2 systems. Using 2 additional incisions along the medial aspect of the right thigh both segments were completely harvested with branches divided between clips and 3-0 silk ties. I then made a separate longitudinal incision along the medial aspect of the right leg below the knee where the vein here was dissected free and was fairly small. I traced this up and made an additional incision above the knee to harvest this segment of vein. Again branches were divided between clips and 3 also ties. Through the distal incision and dissection was carried down to the popliteal space and the popliteal vein retracted superiorly with a vessel loop. The popped artery was patent. The anterior branch of this vein quickly branched in the mid thigh and therefore was ligated at this level. The branch that traced from the distal leg likewise branched became quite small. At the saphenofemoral junction the femoral vein was clamped and the saphenous vein excised from the femoral vein. The femoral vein was oversewn with a 5-0 Prolene suture. Proximal valve was sharply excised. I just harvested both systems that were usable and gently distended with heparinized saline. I selected the 2 largest pieces to use for the bypass.   The patient had intermittent episodes of hypotension and  was responding to fluid and pressors. I elected to continue as the patient seems stable. The patient was then  heparinized. Common femoral artery superficial femoral and deep femoral arteries were clamped and a longitudinal arteriotomy in the common femoral artery. There was some old organized clot here which was endarterectomized and there was an irregular plaque which I felt I would be to endarterectomize otherwise be left with an irregular surface and risk of embolization. Endarterectomy plane was established and the plaque was divided distally and then 2 tacking sutures were placed. The plaque was endarterectomized proximally in the common femoral artery irrigated with copious amounts of heparinized saline. The larger segment of vein was used in a non-reversed fashion. Because of the endarterectomy had to extend the arteriotomy over a longer length and the common femoral artery and therefore extended the venotomy in the proximal saphenous vein to the correct length. The saphenous vein was then sewn end to side to the common femoral artery using continuous 5-0 Prolene suture. Prior to completing the anastomosis the  Inflow was tested and appeared reasonable. The anastomosis was completed. I then used a retrograde Mills valvulotome to lyse the valves in the segment of the vein. I  Then took the next larger segment of vein and splice this and the 2 pieces of vein were sewn into and in a hand class fashion using 2 continuous 6-0 Prolene sutures. The valves in this segment and then within lysed with a retrograde Mills valvulotome and good flow established to the vein graft which was adequate length. Given that I had splice the 2 veins together I did not want to tunnel this deep and not be able to evaluate the vein to vein anastomosis and therefore I tunneled this in a superficial plane. This was then brought down for anastomosis to the below knee popliteal artery. Tourniquet was placed on the thigh. The leg was exsanguinated with an Esmarch bandage and the tourniquet inflated to 300 mmHg. Under tourniquet control a  longitudinal arteriotomy was made in the distal popliteal artery. The vein was spatulated and sewn end to side to the below-knee popliteal artery using continuous 6-0 Prolene suture. Prior to completing the anastomosis the artery was back bled and flushed after the tourniquet was released and anastomosis completed.  At the completion was a good Doppler signal distal to the anastomosis that was graft dependent. The heparin was partially reversed with protamine. At this point the patient had become more unstable and I felt would be necessary to close the wounds rapidly. I placed 2:15 Blake drains. Each of these drains was secured with a silk suture. The vein harvest incisions were closed with a deeper 3-0 Vicryl and the skin closure staples. After hemostasis was obtained in the below the knee incision this was closed with a deep layer of 3-0 Vicryl, a subcutaneous layer of 3-0 Vicryl to skin closure staples. Hemostasis was then obtained in the groin and this was closed with a deep layer of 2-0 Vicryl, a subcutaneous layer of 2-0 Vicryl, and the skin closed with staples. Sterile dressing was applied. The patient tolerated the procedure well transferred to the recovery room in critical condition. All needle and sponge counts were correct.  Waverly Ferrarihristopher Doryce Mcgregory, MD, FACS [facility]  DATE OF DICTATION:   03/02/2015

## 2015-03-02 NOTE — Procedures (Signed)
Central Venous Catheter Insertion Procedure Note Beverly Li [id number] 1946/03/07  Procedure: Insertion of Central Venous Catheter Indications: Assessment of intravascular volume, Drug and/or fluid administration and Frequent blood sampling  Procedure Details Consent: Risks of procedure as well as the alternatives and risks of each were explained to the (patient/caregiver).  Consent for procedure obtained. Time Out: Verified patient identification, verified procedure, site/side was marked, verified correct patient position, special equipment/implants available, medications/allergies/relevent history reviewed, required imaging and test results available.  Performed  Maximum sterile technique was used including antiseptics, cap, gloves, gown, hand hygiene, mask and sheet. Skin prep: Chlorhexidine; local anesthetic administered A antimicrobial bonded/coated triple lumen catheter was placed in the left internal jugular vein using the Seldinger technique.  Evaluation Blood flow good Complications: No apparent complications Patient did tolerate procedure well. Chest X-ray ordered to verify placement.  CXR: pending.  Procedure performed under direct ultrasound guidance for real time vessel cannulation.      [name], [place] - [facility] [place] [facility] Pager: [phone]  or [phone] 03/02/2015, 4:52 PM

## 2015-03-02 NOTE — Anesthesia Preprocedure Evaluation (Addendum)
Anesthesia Evaluation  Patient identified by MRN, date of birth, ID band Patient awake    Reviewed: Allergy & Precautions, H&P , NPO status , Patient's Chart, lab work & pertinent test results, reviewed documented beta blocker date and time   History of Anesthesia Complications Negative for: history of anesthetic complications  Airway Mallampati: II  TM Distance: >3 FB Neck ROM: Full    Dental  (+) Caps, Dental Advisory Given, Teeth Intact   Pulmonary COPD, former smoker,    breath sounds clear to auscultation       Cardiovascular hypertension, Pt. on medications and Pt. on home beta blockers + Peripheral Vascular Disease  + dysrhythmias (PAC) Atrial Fibrillation + Valvular Problems/Murmurs  Rhythm:Regular Rate:Normal  '12 ECHO: HOCM without outflow obstruction, valves OK, EF 65-70%   Neuro/Psych PSYCHIATRIC DISORDERS Anxiety Chronic back pain  Neuromuscular disease    GI/Hepatic Neg liver ROS, GERD  Medicated and Controlled,  Endo/Other  negative endocrine ROS  Renal/GU negative Renal ROS     Musculoskeletal  (+) Arthritis ,   Abdominal   Peds  Hematology negative hematology ROS (+)   Anesthesia Other Findings   Reproductive/Obstetrics                            Anesthesia Physical  Anesthesia Plan  ASA: III  Anesthesia Plan: General   Post-op Pain Management:    Induction: Intravenous  Airway Management Planned: Oral ETT  Additional Equipment:   Intra-op Plan:   Post-operative Plan: Extubation in OR  Informed Consent: I have reviewed the patients History and Physical, chart, labs and discussed the procedure including the risks, benefits and alternatives for the proposed anesthesia with the patient or authorized representative who has indicated his/her understanding and acceptance.   Dental advisory given  Plan Discussed with: CRNA  Anesthesia Plan Comments:          Anesthesia Quick Evaluation

## 2015-03-02 NOTE — Interval H&P Note (Signed)
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 After consideration of risks, benefits and other options for treatment, the patient has consented to  Procedure(s): BYPASS GRAFT FEMORAL-BELOW KNEE POPLITEAL ARTERY (Right) as a surgical intervention .  The patient's history has been reviewed, patient examined, no change in status, stable for surgery.  I have reviewed the patient's chart and labs.  Questions were answered to the patient's satisfaction.     Waverly Ferrariickson, Christopher

## 2015-03-02 NOTE — Transfer of Care (Signed)
Immediate Anesthesia Transfer of Care Note  Patient: Beverly MeekerFrances B Musial  Procedure(s) Performed: Procedure(s): BYPASS GRAFT FEMORAL-BELOW KNEE POPLITEAL ARTERY (Right) ENDARTERECTOMY RIGHT FEMORAL (Right)  Patient Location: SICU  Anesthesia Type:General  Level of Consciousness: Patient remains intubated per anesthesia plan  Airway & Oxygen Therapy: Patient remains intubated per anesthesia plan and Patient placed on Ventilator (see vital sign flow sheet for setting)  Post-op Assessment: Report given to RN and Post -op Vital signs reviewed and unstable, Anesthesiologist notified  Post vital signs: Reviewed and unstable  Last Vitals:  Filed Vitals:   03/02/15 0600  BP: 171/85  Pulse: 92  Temp: 36.5 C  Resp: 16    Complications: No apparent anesthesia complications and cardiovascular complications

## 2015-03-02 NOTE — H&P (View-Only) (Signed)
[facility] [place]  Patient name: Beverly Li MRN: [id number] DOB: Mar 11, 1946 Sex: female  REASON FOR VISIT: Peripheral vascular disease.  HPI: Beverly Li is a 69 y.o. female 4411 following with bilateral lower extremity claudication. She felt that her symptoms were interfering with her lifestyle and wish to consider revascularization. For this reason she was set up for an arteriogram.  Her arteriogram on 01/16/2015 shows that on the right side there is plaque in the common femoral artery. There is moderate disease of the superficial femoral artery which is occluded at the adductor canal with reconstitution at the popliteal artery at the level of the knee. There is three-vessel runoff on the right. On the left side there is eccentric calcific plaque in the left common femoral artery. The left superficial femoral artery is occluded at its origin with reconstitution of the above-knee popliteal artery. There is three-vessel runoff on the left.  She has bilateral calf claudication which also extends to her thighs at times. Her symptoms are equal on both sides. Her symptoms are brought on by ambulation and relieved with rest. She denies any history of rest pain or history of nonhealing ulcers. She feels however that her symptoms are significantly disabling in that she is not able to travel origin joining her normal routine.  Past Medical History  Diagnosis Date  . Atrial arrhythmia   . Hypertension   . Insomnia   . Systolic murmur   . Atrial septal hypertrophy     , proximal, without significant gradient  . Mild aortic sclerosis (HCC)   . Osteopenia   . Syncope     , Vagal  . Diverticulosis   . Dysrhythmia     afib  . GERD (gastroesophageal reflux disease)   . History of kidney stones   . Hypertrophic cardiomyopathy (HCC)     severe LV basilar hypertrophy witn no evidence of significant outflow tract obstruction, EF 65-70%, mild LAE, mild TR, grade 1a  diastolic dysfunction 05/15/10 (Dr. Donato SchultzMark Skains)  . Atrial fibrillation (HCC)   . Peripheral vascular disease (HCC)     Family History  Problem Relation Age of Onset  . Liver cancer Mother   . Cancer Mother     Liver  . Hypertension Mother   . Lung cancer Father   . Cancer Father     Lung  . Breast cancer Sister   . Cancer Sister     Breast    SOCIAL HISTORY: Social History  Substance Use Topics  . Smoking status: Former Smoker -- 0.50 packs/day for 50 years    Types: Cigarettes    Quit date: 04/14/2013  . Smokeless tobacco: Never Used  . Alcohol Use: Yes     Comment: 1-2 per week    Allergies  Allergen Reactions  . Atenolol Cough  . Sulfa Antibiotics Nausea Only  . Codeine Rash    Current Outpatient Prescriptions  Medication Sig Dispense Refill  . amLODipine (NORVASC) 10 MG tablet Take 5 mg by mouth daily.    [name]. aspirin 325 MG tablet Take 325 mg by mouth 2 (two) times daily.     [name]. buPROPion (WELLBUTRIN XL) 150 MG 24 hr tablet TK 1 T PO IN THE MORNING ONCE A DAY  0  . DEXILANT 30 MG capsule TK ONE C PO QD  4  . esomeprazole (NEXIUM) 40 MG capsule Take 40 mg by mouth daily as needed (acid reflux).    [name]. losartan (COZAAR) 100 MG  tablet Take 100 mg by mouth daily.    . Multiple Vitamin (MULTIVITAMIN WITH MINERALS) TABS Take 1 tablet by mouth at bedtime.    . Multiple Vitamins-Iron (STRESS B COMPLEX/IRON) TABS Take 1 tablet by mouth daily.    . nebivolol (BYSTOLIC) 10 MG tablet Take 10 mg by mouth at bedtime.    . rosuvastatin (CRESTOR) 5 MG tablet Take 5 mg by mouth daily.    . Sertraline HCl (ZOLOFT PO) Take 1 tablet by mouth daily. Patient unsure of dosage.    [name] zolpidem (AMBIEN) 10 MG tablet Take 10 mg by mouth at bedtime as needed for sleep.     No current facility-administered medications for this visit.    REVIEW OF SYSTEMS:   denotes positive finding,  denotes negative finding Cardiac  Comments:  Chest pain or chest pressure:    Shortness of breath  upon exertion:    Short of breath when lying flat:    Irregular heart rhythm:        Vascular    Pain in calf, thigh, or hip brought on by ambulation:    Pain in feet at night that wakes you up from your sleep:     Blood clot in your veins:    Leg swelling:         Pulmonary    Oxygen at home:    Productive cough:     Wheezing:         Neurologic    Sudden weakness in arms or legs:     Sudden numbness in arms or legs:     Sudden onset of difficulty speaking or slurred speech:    Temporary loss of vision in one eye:     Problems with dizziness:         Gastrointestinal    Blood in stool:     Vomited blood:         Genitourinary    Burning when urinating:     Blood in urine:        Psychiatric    Major depression:         Hematologic    Bleeding problems:    Problems with blood clotting too easily:        Skin    Rashes or ulcers:        Constitutional    Fever or chills:      PHYSICAL EXAM: Filed Vitals:   02/08/15 1457  BP: 113/70  Pulse: 60  Height:  (1.651 m)  Weight: 127 lb 4.8 oz (57.743 kg)  SpO2: 96%    GENERAL: The patient is a well-nourished female, in no acute distress. The vital signs are documented above. CARDIAC: There is a regular rate and rhythm.  VASCULAR: I do not detect carotid bruits. She has palpable femoral pulses. I cannot palpate popliteal or pedal pulses. She has no significant lower extremity swelling. PULMONARY: There is good air exchange bilaterally without wheezing or rales. ABDOMEN: Soft and non-tender with normal pitched bowel sounds.  MUSCULOSKELETAL: There are no major deformities or cyanosis. NEUROLOGIC: No focal weakness or paresthesias are detected. SKIN: There are no ulcers or rashes noted. PSYCHIATRIC: The patient has a normal affect.  DATA:  I reviewed her arteriogram. On the right side she would require a right femoral to below-knee popliteal artery bypass. On the left side she would require a left femoral to  above-knee popliteal artery bypass.  She wanted to pursue right femoropopliteal bypass grafting so we  mapped her great saphenous vein on the right in the office today. Diameters range from 0.24-0.29 cm. Thus the vein may not be adequate for a bypass.  MEDICAL ISSUES:  DISABLING BILATERAL LOWER EXTREMITY CLAUDICATION: we have discussed conservative treatment including a structured walking program and evening trying to get her into vascular rehabilitation. She is decided that she wants to pursue revascularization. I've explained that given that her vein is small this could likely require use of a bypass graft which is associated with significantly lower patency and also risk of graft infection. She will discuss this with her husband and tentatively we'll plan on surgery on 02/24/2015 or 03/03/2015. We'll start with the right leg and she will require a right femoral to below-knee pop bypass. Hopefully her vein will be adequate but otherwise we would use a prosthetic graft.I have reviewed the indications for lower extremity bypass. I have also reviewed the potential complications of surgery including but not limited to: wound healing problems, infection, graft thrombosis, limb loss, or other unpredictable medical problems. All the patient's questions were answered and they are agreeable to proceed.   Dickson, Christopher [facility] [place] Beeper: [phone]     

## 2015-03-02 NOTE — Anesthesia Procedure Notes (Signed)
Procedure Name: Intubation Date/Time: 03/02/2015 7:39 AM Performed by: Marni GriffonJAMES, Morning Halberg B Pre-anesthesia Checklist: Patient identified, Emergency Drugs available, Suction available and Patient being monitored Patient Re-evaluated:Patient Re-evaluated prior to inductionOxygen Delivery Method: Circle system utilized Preoxygenation: Pre-oxygenation with 100% oxygen Intubation Type: IV induction Ventilation: Mask ventilation without difficulty Grade View: Grade II Tube type: Oral Tube size: 7.5 mm Number of attempts: 1 Airway Equipment and Method: Stylet Placement Confirmation: ETT inserted through vocal cords under direct vision,  breath sounds checked- equal and bilateral and positive ETCO2 Secured at: 21 (cm at teeth) cm Tube secured with: Tape Dental Injury: Teeth and Oropharynx as per pre-operative assessment

## 2015-03-02 NOTE — Consult Note (Signed)
Admit date: 03/02/2015 Referring Physician  Dr. Edilia Bo Primary Physician Clayborn Heron, MD Primary Cardiologist  Dr. Anne Fu Reason for Consultation  HOCM  HPI: 69 year old with severe PVD undergoing Fem-Pop who developed hemodynamic instability, ST segment depression related to hypertrophic cardiomyopathy, septal hypertrophy.  She has a known history of hypertrophic cardiomyopathy and prior echocardiogram in 2012 showed no significant outflow tract gradient at rest. She has not had any symptoms clinically related to her hypertrophic cardiomyopathy in the outpatient setting.  She did have an emergency room visit on 09/19/12 approximate 2 years ago when she had chest discomfort. ST segment changes were noted on the EKG especially lateral leads however this was not changed from her prior original EKG. Troponin was normal. PPI helped out significantly.  In the outpatient setting she has been taking Bystolic 10 mg once a day. She has been on amlodipine as well as losartan for quite some time without any hemodynamic difficulties.  Claudication has been significant and prompted further evaluation by Dr. Edilia Bo.  Today in the operating room, she began to develop hemodynamic instability and a transesophageal echocardiogram was performed which showed fairly significant mitral regurgitation, obliteration of her ventricular cavity, significant aortic insufficiency as well as tricuspid valve regurgitation and pulmonic insufficiency which improved after aggressive fluid resuscitation and phenylephrine was administered. I personally entered the operating room during the end of the case and corresponded with anesthesiology staff.     PMH:   Past Medical History  Diagnosis Date  . Atrial arrhythmia   . Hypertension   . Insomnia   . Systolic murmur   . Atrial septal hypertrophy     , proximal, without significant gradient  . Mild aortic sclerosis (HCC)   . Osteopenia   . Syncope     , Vagal   . Dysrhythmia     afib  . GERD (gastroesophageal reflux disease)   . History of kidney stones   . Hypertrophic cardiomyopathy (HCC)     severe LV basilar hypertrophy witn no evidence of significant outflow tract obstruction, EF 65-70%, mild LAE, mild TR, grade 1a diastolic dysfunction 05/15/10 (Dr. Donato Schultz)  . Atrial fibrillation (HCC)   . Peripheral vascular disease (HCC)   . Anxiety   . Arthritis   . Bell's palsy     when pt. was 69 yrs old, when under stress the left side of face will droop.    PSH:   Past Surgical History  Procedure Laterality Date  . Hernia repair    . Breast enhancement surgery Bilateral   . Inguinal hernia repair Bilateral   . Facial cosmetic surgery    . Left ovarian cyst    . Tonsillectomy    . Peripheral vascular catheterization N/A 01/16/2015    Procedure: Abdominal Aortogram;  Surgeon: Chuck Hint, MD;  Location: [facility] INVASIVE CV LAB;  Service: Cardiovascular;  Laterality: N/A;   Allergies:  Amoxicillin; Atenolol; Sulfa antibiotics; and Codeine Prior to Admit Meds:   Prior to Admission medications   Medication Sig Start Date End Date Taking? Authorizing Provider  acetaminophen (TYLENOL) 500 MG tablet Take 1,000 mg by mouth every 8 (eight) hours as needed for mild pain or moderate pain.   Yes Historical Provider, MD  amLODipine (NORVASC) 10 MG tablet Take 5 mg by mouth daily.   Yes Historical Provider, MD  aspirin 325 MG tablet Take 325 mg by mouth 2 (two) times daily.    Yes Historical Provider, MD  buPROPion (WELLBUTRIN XL)  150 MG 24 hr tablet TK 1 T PO IN THE MORNING ONCE A DAY 12/20/14  Yes Historical Provider, MD  DEXILANT 30 MG capsule TAKE ONE CAPSULE BY MOUTH  EVERYDAY 01/17/15  Yes Historical Provider, MD  losartan (COZAAR) 100 MG tablet Take 100 mg by mouth daily.   Yes Historical Provider, MD  Multiple Vitamin (MULTIVITAMIN WITH MINERALS) TABS Take 1 tablet by mouth at bedtime.   Yes Historical Provider, MD  Multiple Vitamins-Iron  (STRESS B COMPLEX/IRON) TABS Take 1 tablet by mouth daily.   Yes Historical Provider, MD  nebivolol (BYSTOLIC) 10 MG tablet Take 10 mg by mouth at bedtime.   Yes Historical Provider, MD  rosuvastatin (CRESTOR) 5 MG tablet Take 5 mg by mouth daily.   Yes Historical Provider, MD  traMADol (ULTRAM) 50 MG tablet Take 50 mg by mouth every 6 (six) hours as needed for moderate pain.   Yes Historical Provider, MD  zolpidem (AMBIEN) 10 MG tablet Take 10 mg by mouth at bedtime as needed for sleep.   Yes Historical Provider, MD   Fam HX:    Family History  Problem Relation Age of Onset  . Liver cancer Mother   . Cancer Mother     Liver  . Hypertension Mother   . Lung cancer Father   . Cancer Father     Lung  . Breast cancer Sister   . Cancer Sister     Breast   Social HX:    Social History   Social History  . Marital Status: Married    Spouse Name: N/A  . Number of Children: N/A  . Years of Education: N/A   Occupational History  . Not on file.   Social History Main Topics  . Smoking status: Former Smoker -- 0.50 packs/day for 50 years    Types: Cigarettes    Quit date: 04/14/2013  . Smokeless tobacco: Never Used  . Alcohol Use: Yes     Comment: 1-2 per week  . Drug Use: No  . Sexual Activity: Not on file   Other Topics Concern  . Not on file   Social History Narrative     ROS:  Unable to obtain currently, sedated, intubated   Physical Exam: Blood pressure 171/85, pulse 92, temperature 97.7 F (36.5 C), resp. rate 16, SpO2 97 %.   General: Well developed, well nourished, sedated, intubated, ET tube in place as well as transesophageal echocardiogram probe.  Head: Eyes shut Normal cephalic and atramatic  Lungs:  Unable to auscultate in her current position however ET tube with secretions noted  Heart:   Most recent exam on 01/31/15 demonstrated regular rhythm with 2/6 systolic murmur  Abdomen: Nondistended. Msk: Unable to assess  Extremities: Postsurgical changes    Neuro:Sedate  GU: Deferred Rectal: Deferred Psych:  Unable to assess       Labs: Lab Results  Component Value Date   WBC 8.1 02/27/2015   HGB 17.8* 02/27/2015   HCT 54.3* 02/27/2015   MCV 93.1 02/27/2015   PLT 219 02/27/2015     Recent Labs Lab 02/27/15 1128  NA 143  K 4.4  CL 108  CO2 28  BUN 33*  CREATININE 0.74  CALCIUM 9.6  PROT 7.0  BILITOT 0.4  ALKPHOS 115  ALT 18  AST 23  GLUCOSE 97    Recent Labs  03/02/15 1055  TROPONINI 0.03   Transesophageal echocardiogram reviewed with anesthesiology. Septal hypertrophy were proximally 1.9-2 cm noted. Hyperdynamic left ventricular state noted.  Mitral regurgitation was moderate at times and dynamic based upon her hypertrophic physiology. AI was transiently noted  EKG:  Deep T-wave inversions noted in the lateral leads which are old, sinus rhythm. Personally viewed.   ASSESSMENT/PLAN:    69 year old female with hypertrophic cardiomyopathy, severe peripheral vascular disease with perioperative hypertrophic obstructive physiology.  Hypertrophic obstructive cardiomyopathy  - Previous echocardiogram did not demonstrate a significant outflow tract gradient.  - We will repeat echocardiogram transthoracic  - Transesophageal echocardiogram intraoperatively definitely demonstrates a dynamic outflow tract gradient and during period of increased hyperdynamic state, stress response, her gradient was accentuated and resulted in hemodynamic instability.  - Prompt fluid resuscitation was administered as well as phenylephrine for alpha agonist.   - Avoidance of beta agonist such as dopamine, epinephrine, norepinephrine.  - Resume beta blocker when able. This will help minimize the outflow tract gradient as well as.   - T wave changes/ST segment depressions were accentuated per report during case which certainly could've occurred during periods of either significant hypotension or hypertension given the thickness of her  septum/coronary gradient.  - She does have significant lateral ST segment depressions at baseline.  - Recent nuclear stress test was reassuring.  We will continue to follow along.  Donato SchultzSKAINS, MARK, MD  03/02/2015  12:32 PM

## 2015-03-02 NOTE — Progress Notes (Signed)
     Spoke with husband and Dr. Marchelle Gearingamaswamy. Described pathophysiology at length.   Avoid beta agonist (epi, norepi, dopamine) Seems to be adequately fluid resuscitated. Weaned off alpha agonist  Respiratory acidosis - vent changes made.   MAP of 60-65 is reasonable.   Will continue close and careful monitoring.   Beverly SchultzSKAINS, Beverly Joens, MD

## 2015-03-03 ENCOUNTER — Inpatient Hospital Stay (HOSPITAL_COMMUNITY): Payer: Medicare Other

## 2015-03-03 ENCOUNTER — Encounter (HOSPITAL_COMMUNITY): Payer: Self-pay | Admitting: Vascular Surgery

## 2015-03-03 DIAGNOSIS — I421 Obstructive hypertrophic cardiomyopathy: Secondary | ICD-10-CM | POA: Diagnosis present

## 2015-03-03 DIAGNOSIS — I248 Other forms of acute ischemic heart disease: Secondary | ICD-10-CM | POA: Diagnosis present

## 2015-03-03 DIAGNOSIS — I739 Peripheral vascular disease, unspecified: Secondary | ICD-10-CM

## 2015-03-03 LAB — URINALYSIS, ROUTINE W REFLEX MICROSCOPIC
BILIRUBIN URINE: NEGATIVE
Glucose, UA: NEGATIVE mg/dL
KETONES UR: 15 mg/dL — AB
Leukocytes, UA: NEGATIVE
NITRITE: NEGATIVE
PH: 5 (ref 5.0–8.0)
Protein, ur: NEGATIVE mg/dL
Specific Gravity, Urine: 1.019 (ref 1.005–1.030)

## 2015-03-03 LAB — BASIC METABOLIC PANEL
Anion gap: 7 (ref 5–15)
BUN: 10 mg/dL (ref 6–20)
CHLORIDE: 103 mmol/L (ref 101–111)
CO2: 27 mmol/L (ref 22–32)
CREATININE: 0.69 mg/dL (ref 0.44–1.00)
Calcium: 8.1 mg/dL — ABNORMAL LOW (ref 8.9–10.3)
GFR calc non Af Amer: >60 mL/min (ref >60)
GLUCOSE: 95 mg/dL (ref 65–99)
Potassium: 3.3 mmol/L — ABNORMAL LOW (ref 3.5–5.1)
Sodium: 137 mmol/L (ref 135–145)

## 2015-03-03 LAB — GLUCOSE, CAPILLARY
GLUCOSE-CAPILLARY: 88 mg/dL (ref 65–99)
GLUCOSE-CAPILLARY: 84 mg/dL (ref 65–99)
Glucose-Capillary: 77 mg/dL (ref 65–99)

## 2015-03-03 LAB — CBC WITH DIFFERENTIAL/PLATELET
BASOS ABS: 0 10*3/uL (ref 0.0–0.1)
Basophils Relative: 0 %
Eosinophils Absolute: 0 10*3/uL (ref 0.0–0.7)
Eosinophils Relative: 0 %
HEMATOCRIT: 40.5 % (ref 36.0–46.0)
Hemoglobin: 13.3 g/dL (ref 12.0–15.0)
LYMPHS ABS: 3 10*3/uL (ref 0.7–4.0)
LYMPHS PCT: 22 %
MCH: 30.2 pg (ref 26.0–34.0)
MCHC: 32.8 g/dL (ref 30.0–36.0)
MCV: 92 fL (ref 78.0–100.0)
MONO ABS: 1.1 10*3/uL — AB (ref 0.1–1.0)
Monocytes Relative: 9 %
NEUTROS ABS: 9.1 10*3/uL — AB (ref 1.7–7.7)
Neutrophils Relative %: 69 %
Platelets: 187 10*3/uL (ref 150–400)
RBC: 4.4 MIL/uL (ref 3.87–5.11)
RDW: 14.3 % (ref 11.5–15.5)
WBC: 13.3 10*3/uL — ABNORMAL HIGH (ref 4.0–10.5)

## 2015-03-03 LAB — LACTIC ACID, PLASMA: LACTIC ACID, VENOUS: 1.3 mmol/L (ref 0.5–2.0)

## 2015-03-03 LAB — URINE MICROSCOPIC-ADD ON

## 2015-03-03 LAB — PHOSPHORUS: Phosphorus: 2.8 mg/dL (ref 2.5–4.6)

## 2015-03-03 LAB — PROCALCITONIN: Procalcitonin: <0.1 ng/mL

## 2015-03-03 LAB — APTT: APTT: 34 s (ref 24–37)

## 2015-03-03 LAB — MAGNESIUM: Magnesium: 1.2 mg/dL — ABNORMAL LOW (ref 1.7–2.4)

## 2015-03-03 MED ORDER — VANCOMYCIN HCL IN DEXTROSE 1-5 GM/200ML-% IV SOLN
1000.0000 mg | Freq: Once | INTRAVENOUS | Status: AC
  Administered 2015-03-03: 1000 mg via INTRAVENOUS
  Filled 2015-03-03: qty 200

## 2015-03-03 MED ORDER — VANCOMYCIN HCL IN DEXTROSE 750-5 MG/150ML-% IV SOLN
750.0000 mg | Freq: Two times a day (BID) | INTRAVENOUS | Status: DC
  Administered 2015-03-04 – 2015-03-07 (×8): 750 mg via INTRAVENOUS
  Filled 2015-03-03 (×9): qty 150

## 2015-03-03 MED ORDER — PANTOPRAZOLE SODIUM 40 MG PO PACK
40.0000 mg | PACK | Freq: Every day | ORAL | Status: DC
  Administered 2015-03-03 – 2015-03-09 (×7): 40 mg
  Filled 2015-03-03 (×10): qty 20

## 2015-03-03 MED ORDER — JEVITY 1.2 CAL PO LIQD
1000.0000 mL | ORAL | Status: DC
  Administered 2015-03-03: 20 mL/h
  Administered 2015-03-05: 1000 mL
  Administered 2015-03-06: 60 mL/h
  Administered 2015-03-07: 1000 mL
  Filled 2015-03-03 (×8): qty 1000

## 2015-03-03 MED ORDER — LEVOFLOXACIN IN D5W 750 MG/150ML IV SOLN
750.0000 mg | INTRAVENOUS | Status: AC
  Administered 2015-03-03 – 2015-03-09 (×7): 750 mg via INTRAVENOUS
  Filled 2015-03-03 (×7): qty 150

## 2015-03-03 MED ORDER — MAGNESIUM SULFATE 4 GM/100ML IV SOLN
4.0000 g | Freq: Once | INTRAVENOUS | Status: AC
  Administered 2015-03-03: 4 g via INTRAVENOUS
  Filled 2015-03-03: qty 100

## 2015-03-03 MED ORDER — ASPIRIN 81 MG PO CHEW
324.0000 mg | CHEWABLE_TABLET | Freq: Every day | ORAL | Status: DC
  Administered 2015-03-03 – 2015-03-09 (×7): 324 mg via ORAL
  Filled 2015-03-03 (×7): qty 4

## 2015-03-03 MED ORDER — ACETAMINOPHEN 160 MG/5ML PO SOLN
650.0000 mg | ORAL | Status: DC | PRN
  Administered 2015-03-03 – 2015-03-07 (×2): 650 mg
  Filled 2015-03-03 (×2): qty 20.3

## 2015-03-03 MED ORDER — PRO-STAT SUGAR FREE PO LIQD
30.0000 mL | Freq: Every day | ORAL | Status: DC
  Administered 2015-03-03 – 2015-03-07 (×5): 30 mL
  Filled 2015-03-03 (×4): qty 30

## 2015-03-03 MED ORDER — VITAL HIGH PROTEIN PO LIQD: 1000.0000 mL | ORAL | Status: DC

## 2015-03-03 MED ORDER — ENOXAPARIN SODIUM 40 MG/0.4ML ~~LOC~~ SOLN
40.0000 mg | SUBCUTANEOUS | Status: DC
  Administered 2015-03-03 – 2015-03-08 (×6): 40 mg via SUBCUTANEOUS
  Filled 2015-03-03 (×9): qty 0.4

## 2015-03-03 NOTE — Progress Notes (Signed)
Initial Nutrition Assessment  DOCUMENTATION CODES:   Not applicable  INTERVENTION:   Initiate Jevity 1.2 formula at 20 ml/hr and increase by 10 ml every 4 hours to goal rate of 60 ml/hr   Prostat liquid protein 30 ml daily via tube  Total above TF regimen provides 1828 kcals, 95 gm protein, 1162 ml of free water  NUTRITION DIAGNOSIS:   Inadequate oral intake related to inability to eat as evidenced by NPO status  GOAL:   Patient will meet greater than or equal to 90% of their needs  MONITOR:   TF tolerance, Vent status, Labs, Weight trends, I & O's  REASON FOR ASSESSMENT:   Consult Enteral/tube feeding initiation and management  ASSESSMENT:   69 yo Female with PMH of HTN, atrial fib, atrial arrhythmia; presented with bilateral lower extremity claudication.   Patient s/p procedures 11/17: RIGHT COMMON FEMORAL ARTERY ENDARTERECTOMY RIGHT COMMON FEMORAL ARTERY TO BELOW-KNEE POPLITEAL ARTERY BYASS  Patient is currently intubated on ventilator support -- OGT in place MV: 13.3 L/min Temp (24hrs), Avg:99.4 F (37.4 C), Min:97.5 F (36.4 C), Max:101.9 F (38.8 C)   RD unable to complete Nutrition Focused Physical Exam at this time.  Diet Order:  Diet NPO time specified  Skin:  Reviewed, no issues  Last BM:  N/A  Height:   Ht Readings from Last 1 Encounters:  03/03/15 5\' 5"  (1.651 m)    Weight:   Wt Readings from Last 1 Encounters:  03/03/15 129 lb 3 oz (58.6 kg)    Ideal Body Weight:  56.8 kg  BMI:  Body mass index is 21.5 kg/(m^2).  Estimated Nutritional Needs:   Kcal:  1745  Protein:  90-100 gm  Fluid:  per MD  EDUCATION NEEDS:   No education needs identified at this time  Maureen ChattersKatie Patricie Geeslin, RD, LDN Pager #: [phone] After-Hours Pager #: [phone]

## 2015-03-03 NOTE — Progress Notes (Signed)
eLink Physician-Brief Progress Note Patient Name: Beverly Li DOB: Jul 30, 1945 MRN: [id number]   Date of Service  03/03/2015  HPI/Events of Note  Fever 103 pcxr has been called edema, but it is rt greater left dominant and now fevers Concern HCAP  eICU Interventions  amox allergy Add vanc, levoflox, BC, tylenal, UA, sputum     Intervention Category Major Interventions: Infection - evaluation and management  Micharl Helmes J. 03/03/2015, 7:58 PM

## 2015-03-03 NOTE — Consult Note (Signed)
PULMONARY / CRITICAL CARE MEDICINE   Name: Beverly Li MRN: [id number] DOB: 1945/10/10    ADMISSION DATE:  03/02/2015 CONSULTATION DATE:  03/02/2015   REFERRING MD :  Dr Edilia Bo [facility]  CHIEF COMPLAINT:  Acute post op resp failure   BRIEF  69 year old female with baseline claudication but no dyspnea but has some HOCM (followed by Dr Anne Fu) but at baseline and has had a normal cardiac stress test and is able to climb stairs without dyspnea. He underwent on 03/02/2015 right below knee popliteal artery femoropopliteal bypass and femoral artery endarterectomy on the right. Overall operative time was approximately 4 hours according to anesthesia. First 2 hours no problem but subsequently started having severe evidence of hypertrophic obstructive cardiomyopathy as evidenced by intraoperative echo and periods of hypotension. Status post 4-5 liters of fluid and for albumin infusions. Brought into the intensive care unit postoperatively and was spontaneously weaned off Neo-Synephrine but was found to be in respiratory acidosis with severe hypoxemia and chest x-ray with acute pulmonary edema bilaterally. Pulmonary critical care called for consultation 03/02/15   SUBJECTIVE/OVERNIGHT/INTERVAL HX 03/03/2015 :  Hypoxemia some improved on vent. Needed sedation gtt to avoid bp lability. SBP 110/MAP 73 on neo 100. On fent gtt wean. RN says versed works beter for BP lability   VITAL SIGNS: Temp:  [97.5 F (36.4 C)-101.9 F (38.8 C)] 101.9 F (38.8 C) (11/18 0725) Pulse Rate:  [46-73] 62 (11/18 0645) Resp:  [14-28] 28 (11/18 0645) BP: (90-179)/(58-83) 133/69 mmHg (11/18 0600) SpO2:  [90 %-100 %] 100 % (11/18 0645) Arterial Line BP: (87-197)/(41-88) 116/50 mmHg (11/18 0645) FiO2 (%):  [80 %-100 %] 80 % (11/18 0400) HEMODYNAMICS:   VENTILATOR SETTINGS: Vent Mode:  [-] PRVC FiO2 (%):  [80 %-100 %] 80 % Set Rate:  [16 bmp-28 bmp] 28 bmp Vt Set:  [500 mL] 500 mL PEEP:  [5 cmH20-8 cmH20] 8  cmH20 Plateau Pressure:  [21 cmH20-23 cmH20] 21 cmH20 INTAKE / OUTPUT:  Intake/Output Summary (Last 24 hours) at 03/03/15 0810 Last data filed at 03/03/15 0600  Gross per 24 hour  Intake 8215.06 ml  Output   3665 ml  Net 4550.06 ml    PHYSICAL EXAMINATION: General:  Female other ventilator looks critically ill Neuro:  RA SS sedation score 0 on when gttsedation HEENT:  Endotracheal tube present equal air entry neck is supple Cardiovascular:  Normal heart sounds. Normal blood pressure. Lungs:  Synchronous with the ventilator. Occasional crackles present Abdomen:  Soft nontender nor megaly Musculoskeletal:  Right distal Band-Aid present Skin:  Intact on exposed areas    PULMONARY  Recent Labs Lab 03/02/15 1227 03/02/15 1331 03/02/15 1437 03/02/15 1611 03/02/15 2011  PHART 7.108* 7.159* 7.253* 7.277* 7.381  PCO2ART 62.6* 71.1* 56.1* 46.1* 40.3  PO2ART 63.0* 70.0* 73.0* 104.0* 130.0*  HCO3 20.0 25.5* 24.9* 21.5 24.0  TCO2 O2SAT 83.0 88.0 92.0 97.0 99.0    CBC  Recent Labs Lab 02/27/15 1128 03/02/15 1227 03/02/15 1400 03/02/15 2304  HGB 17.8* 13.6 14.5 14.5  HCT 54.3* 40.0 45.9 44.6  WBC 8.1  --  18.6* 15.0*  PLT 219  --  203 212    COAGULATION  Recent Labs Lab 02/27/15 1128  INR 1.01    CARDIAC    Recent Labs Lab 03/02/15 1055 03/02/15 1730 03/02/15 2304  TROPONINI 0.03 0.04* 0.06*   No results for input(s): PROBNP in the last 168 hours.   CHEMISTRY  Recent Labs Lab  02/27/15 1128 03/02/15 1227 03/02/15 2304 03/03/15 0420  NA 143 139 139  --   K 4.4 4.0 3.9  --   CL 108  --  106  --   CO2 28  --  25  --   GLUCOSE 97  --  104*  --   BUN 33*  --  10  --   CREATININE 0.74  --  0.63  --   CALCIUM 9.6  --  8.3*  --   MG  --   --   --  1.2*  PHOS  --   --   --  2.8   Estimated Creatinine Clearance: 59.7 mL/min (by C-G formula based on Cr of 0.63).   LIVER  Recent Labs Lab 02/27/15 1128  AST 23  ALT 18   ALKPHOS 115  BILITOT 0.4  PROT 7.0  ALBUMIN 3.8  INR 1.01     INFECTIOUS No results for input(s): LATICACIDVEN, PROCALCITON in the last 168 hours.   ENDOCRINE CBG (last 3)  No results for input(s): GLUCAP in the last 72 hours.       IMAGING x48h  - image(s) personally visualized  -   highlighted in bold Dg Chest Port 1 View  03/03/2015  CLINICAL DATA:  Check endotracheal tube placement EXAM: PORTABLE CHEST - 1 VIEW COMPARISON:  03/02/2015 FINDINGS: Cardiac shadow is stable. An endotracheal tube, nasogastric catheter and left jugular central line are seen in satisfactory position. Slight increase in the degree of infiltrative density in the right mid and lower lung is noted. Slight improved aeration on the left is seen. IMPRESSION: Slight improved aeration in the left base. Increasing right basilar infiltrate. Electronically Signed   By: Alcide CleverMark  Lukens M.D.   On: 03/03/2015 07:42   Dg Chest Portable 1 View  03/02/2015  CLINICAL DATA:  Central line placement, hypertension, hypertrophic cardiomyopathy, atrial fibrillation, former smoker, GERD EXAM: PORTABLE CHEST 1 VIEW COMPARISON:  Portable exam 1704 hours compared to 03/02/2015 FINDINGS: Tip of endotracheal tube projects 4.7 cm above carina. New LEFT jugular central venous catheter tip projecting over SVC. Stable heart size and mediastinal contours. Pulmonary vascular congestion. COPD changes with perihilar infiltrates likely pulmonary edema. No gross pleural effusion or pneumothorax. Bones demineralized. Capsular calcification in the RIGHT breast prosthesis. IMPRESSION: No pneumothorax following LEFT jugular line placement. Pulmonary vascular congestion and perihilar infiltrates question pulmonary edema, question minimally improved since previous study. Electronically Signed   By: Ulyses SouthwardMark  Boles M.D.   On: 03/02/2015 17:16   Dg Chest Port 1 View  03/02/2015  CLINICAL DATA:  Recent intubation, status post femoral popliteal surgery EXAM:  PORTABLE CHEST - 1 VIEW COMPARISON:  09/19/2012 FINDINGS: The cardiac shadow is stable. Diffuse vascular congestion is seen. Calcified breast implant is noted on the right. The left breast implant is not calcified. Interstitial and alveolar edema is noted. Endotracheal tube is seen in satisfactory position approximately 4.3 cm above the carina. No bony abnormality is noted. IMPRESSION: Vascular congestion and pulmonary edema predominately of an interstitial type. An endotracheal tube is noted in satisfactory position. Electronically Signed   By: Alcide CleverMark  Lukens M.D.   On: 03/02/2015 13:49       ASSESSMENT / PLAN:  PULMONARY OETT 03/02/2015>> A: Acute postoperative respiratory failure due to flareup of hypertrophic objective cardiomyopathy intraoperatively with associated pulmonary edema   - hypoxemia appears some better  P:   Full ventilator support with high PEEP and minute ventilation - adjust fio2/peep for pulrse ox goal >  88% Check abg  CARDIOVASCULAR CVLc x A: - Baseline hypertrophic obstructive cardiomyopathy. Reported as mild, intraoperative flareup due to nothing by mouth status and vasodilatation with anesthesia. Complicated by fluid overload and pulmonary edema during intraoperative resuscitation.   - improved bp lability after continuous sedation . Avoiding lasix. Still in circulatory shock needing neo   P:  Mean arterial pressure goal greater than 65 - use Neo-Synephrine Avoid dopamine, dobutamine and levo fed Allow for spontaneous diuresis  Vascular A: Status post right femoropopliteal bypass P - The vascular service - IV heparin perr vascular service  RENAL A:  At risk for renal injury   - severe hypomagnesemia 03/03/15 P:   4gm mag sulfate repletion Monitor  GASTROINTESTINAL A:  Nothing by mouth P:   NG tube Start tube feeds PPI  HEMATOLOGIC A:  At risk for anemia of critical illness P:  Monitor IV heparin and SCD  INFECTIOUS A:  No evidence of  infection but having new fever P:   Check PCT Check Lactate Cutlure if needed If biomarkers high culture and start abx Monitor  ENDOCRINE A:  At risk for hyperglycemia   P:   Monitor  NEUROLOGIC A: #baseline hmoe meds  - ambien qhs, ultram, wellbutrin  #Current  - at risk for  Postoperative pain and at risk for acute encephalopathy   - WUA in progress 03/03/2015  P:   RASS goal: 0 to -2 Wean fent gtt to off = RN directed Fentanyl and Versed when necessary   FAMILY  - Updates:  03/02/15 - Husband updated at the bedside and discussed with Dr. Jaquelyn Bitter of cardiology at the bedside who also beta the husband  11/18/1 6- husband not at bedside at this point in time  - Inter-disciplinary family meet or Palliative Care meeting due by:  03/09/2015       The patient is critically ill with multiple organ systems failure and requires high complexity decision making for assessment and support, frequent evaluation and titration of therapies, application of advanced monitoring technologies and extensive interpretation of multiple databases.   Critical Care Time devoted to patient care services described in this note is  45  Minutes. This time reflects time of care of this signee Dr Kalman Shan. This critical care time does not reflect procedure time, or teaching time or supervisory time of PA/NP/Med student/Med Resident etc but could involve care discussion time    Dr. Kalman Shan, M.D., [facility].C.P Pulmonary and Critical Care Medicine Staff Physician [facility] Pager: [phone], If no answer or between  15:00h - 7:00h: call [phone]  03/03/2015 8:10 AM

## 2015-03-03 NOTE — Progress Notes (Addendum)
  Progress Note    03/03/2015 7:21 AM 1 Day Post-Op  Subjective:  Intubated-awakes to voice  Tm 99.3 now 101.9 (just taken) HR 50's-60's SB 110's-150's systolic (one episode of 190's systolic) 99% .80 FiO2  Filed Vitals:   03/03/15 0645  BP:   Pulse: 62  Temp:   Resp: 28    Physical Exam: Cardiac:  regular Incisions:  Right groin incision is covered and bandage dry; lower leg wounds are bandaged and dry Extremities:  Biphasic doppler signals right DP/AT   CBC    Component Value Date/Time   WBC 15.0* 03/02/2015 2304   RBC 4.81 03/02/2015 2304   HGB 14.5 03/02/2015 2304   HCT 44.6 03/02/2015 2304   PLT 212 03/02/2015 2304   MCV 92.7 03/02/2015 2304   MCH 30.1 03/02/2015 2304   MCHC 32.5 03/02/2015 2304   RDW 14.0 03/02/2015 2304    BMET    Component Value Date/Time   NA 139 03/02/2015 2304   K 3.9 03/02/2015 2304   CL 106 03/02/2015 2304   CO2 25 03/02/2015 2304   GLUCOSE 104* 03/02/2015 2304   BUN 10 03/02/2015 2304   CREATININE 0.63 03/02/2015 2304   CALCIUM 8.3* 03/02/2015 2304   GFRNONAA >60 03/02/2015 2304   GFRAA >60 03/02/2015 2304    INR    Component Value Date/Time   INR 1.01 02/27/2015 1128     Intake/Output Summary (Last 24 hours) at 03/03/15 0721 Last data filed at 03/03/15 0600  Gross per 24 hour  Intake 8465.06 ml  Output   3665 ml  Net 4800.06 ml     Assessment:  69 y.o. female is s/p:  1. Right common femoral artery endarterectomy 2. Right common femoral artery to below-knee popliteal artery bypass with composite vein graft (2 pieces right great saphenous vein spliced together) 1 Day Post-Op  Plan: -pt has a patent bypass this morning with brisk doppler flow right foot.   -continues to have neo running and has been titrated upward through out the night-wean per cardiology -intubated-wean per critical care/cards -HOCM-per Dr. Anne FuSkains note, pt will need echo-will defer to cards -DVT prophylaxis:  Heparin gtt.  Will d/c  this and start Lovenox -dc drain that has only drained 10cc since surgery. -keep drain #1 until less than 20cc -fever this morning of 101.9 and WBC of 15k.  She did have pulmonary congestion and perihilar infiltrates possible pulmonary edema.  May need to continue ABx    Doreatha MassedSamantha Rhyne, PA-C [facility] [phone] 03/03/2015 7:21 AM  Agree with above. Brisk dorsalis pedis signal with the Doppler. On drain discontinued. Weaning per pulmonary. Transthoracic echo pending.  Waverly Ferrarihristopher Harrel Ferrone, MD, FACS Beeper [phone] Office: [phone]

## 2015-03-03 NOTE — Progress Notes (Signed)
ANTIBIOTIC CONSULT NOTE - INITIAL  Pharmacy Consult for Levaquin and Vancomycin  Indication: pneumonia  Allergies  Allergen Reactions  . Amoxicillin Other (See Comments)    UTI  . Atenolol Cough  . Sulfa Antibiotics Nausea Only  . Codeine Rash    Patient Measurements: Height: 5\' 5"  (165.1 cm) Weight: 129 lb 3 oz (58.6 kg) IBW/kg (Calculated) : 57   Vital Signs: Temp: 103 F (39.4 C) (11/18 1934) Temp Source: Oral (11/18 1934) BP: 95/55 mmHg (11/18 1900) Pulse Rate: 71 (11/18 1900) Intake/Output from previous day: 11/17 0701 - 11/18 0700 In: 8547.6 [I.V.:7637.6; NG/GT:60; IV Piggyback:850] Out: 3715 [Urine:3440; Emesis/NG output:150; Drains:105; Blood:20] Intake/Output from this shift:    Labs:  Recent Labs  03/02/15 1400 03/02/15 2304 03/03/15 0850  WBC 18.6* 15.0* 13.3*  HGB 14.5 14.5 13.3  PLT 203 212 187  CREATININE  --  0.63 0.69    Microbiology: Cx data: 11/18 sputum: px  Abx:  Levaquin: 11/18 >> Vancomycin 11/18 >>   Assessment: [name] admitted for elective Right common femoral artery endarterectomy and right common femoral artery to below-knee popliteal artery bypass with composite vein graft on 11/17 that developed post op respiratory failure. At present she is neo with tmax of 103 and most recent x-ray concerning for pna.  Goal of Therapy:  Vancomycin trough level 15-20 mcg/ml  Treatment of infection  Plan:  1. Vancomycin 1000 mg x1 now followed by 750 mg Q12H as a maintenance dose 2. If continued [facility] 3. Levaquin 750 mg IV Q24H 4. PCN allergy noted; but has tolerated cefuroxime as surgical prophylaxis so could use ceftazidine if desired or fails to respond to current abx regimen 5. F/u px micro data and narrow abx as feasible    Beverly SamplesAndy Daryel Li, PharmD, BCPS 03/03/2015, 8:15 PM Pager: [phone]

## 2015-03-03 NOTE — Progress Notes (Signed)
Subjective:  Sedate on ventilator, husband in room  Yesterday, Versed for sedation help stabilize blood pressures and prevent hypertrophic obstructive cardiomyopathy physiology.  FiO2 decreased from 100-60  Pulmonary edema noted on x-ray  Objective:  Vital Signs in the last 24 hours: Temp:  [97.5 F (36.4 C)-101.9 F (38.8 C)] 101.9 F (38.8 C) (11/18 0725) Pulse Rate:  [46-73] 67 (11/18 0814) Resp:  [14-28] 28 (11/18 0814) BP: (90-179)/(58-83) 110/64 mmHg (11/18 0814) SpO2:  [90 %-100 %] 98 % (11/18 0814) Arterial Line BP: (87-197)/(41-88) 116/50 mmHg (11/18 0645) FiO2 (%):  [60 %-100 %] 60 % (11/18 0814)  Intake/Output from previous day: 11/17 0701 - 11/18 0700 In: 8465.1 [I.V.:7555.1; NG/GT:60; IV Piggyback:850] Out: 3665 [Urine:3390; Emesis/NG output:150; Drains:105; Blood:20]   Physical Exam: General: Ill-appearing, ET tube, comfortable  Head:  Normocephalic and atraumatic. IJ line Lungs: Minimal crackles heard bilaterally. Heart: Normal S1 and S2.  2/6 systolic murmur left lower sternal border, no rubs or gallops.  Abdomen: soft, non-tender, positive bowel sounds. Extremities: No clubbing or cyanosis. Trace edema. Postsurgical scars noted Neurologic: Sedate.     Lab Results:  Recent Labs  03/02/15 2304 03/03/15 0850  WBC 15.0* 13.3*  HGB 14.5 13.3  PLT 212 187    Recent Labs  03/02/15 2304 03/03/15 0850  NA 139 137  K 3.9 3.3*  CL 106 103  CO2 25 27  GLUCOSE 104* 95  BUN 10 10  CREATININE 0.63 0.69    Recent Labs  03/02/15 1730 03/02/15 2304  TROPONINI 0.04* 0.06*   Hepatic Function Panel No results for input(s): PROT, ALBUMIN, AST, ALT, ALKPHOS, BILITOT, BILIDIR, IBILI in the last 72 hours. No results for input(s): CHOL in the last 72 hours. No results for input(s): PROTIME in the last 72 hours.  Imaging: Dg Chest Port 1 View  03/03/2015  CLINICAL DATA:  Check endotracheal tube placement EXAM: PORTABLE CHEST - 1 VIEW  COMPARISON:  03/02/2015 FINDINGS: Cardiac shadow is stable. An endotracheal tube, nasogastric catheter and left jugular central line are seen in satisfactory position. Slight increase in the degree of infiltrative density in the right mid and lower lung is noted. Slight improved aeration on the left is seen. IMPRESSION: Slight improved aeration in the left base. Increasing right basilar infiltrate. Electronically Signed   By: Alcide Clever M.D.   On: 03/03/2015 07:42   Dg Chest Portable 1 View  03/02/2015  CLINICAL DATA:  Central line placement, hypertension, hypertrophic cardiomyopathy, atrial fibrillation, former smoker, GERD EXAM: PORTABLE CHEST 1 VIEW COMPARISON:  Portable exam 1704 hours compared to 03/02/2015 FINDINGS: Tip of endotracheal tube projects 4.7 cm above carina. New LEFT jugular central venous catheter tip projecting over SVC. Stable heart size and mediastinal contours. Pulmonary vascular congestion. COPD changes with perihilar infiltrates likely pulmonary edema. No gross pleural effusion or pneumothorax. Bones demineralized. Capsular calcification in the RIGHT breast prosthesis. IMPRESSION: No pneumothorax following LEFT jugular line placement. Pulmonary vascular congestion and perihilar infiltrates question pulmonary edema, question minimally improved since previous study. Electronically Signed   By: Ulyses Southward M.D.   On: 03/02/2015 17:16   Dg Chest Port 1 View  03/02/2015  CLINICAL DATA:  Recent intubation, status post femoral popliteal surgery EXAM: PORTABLE CHEST - 1 VIEW COMPARISON:  09/19/2012 FINDINGS: The cardiac shadow is stable. Diffuse vascular congestion is seen. Calcified breast implant is noted on the right. The left breast implant is not calcified. Interstitial and alveolar edema is noted. Endotracheal tube is seen in satisfactory  position approximately 4.3 cm above the carina. No bony abnormality is noted. IMPRESSION: Vascular congestion and pulmonary edema predominately of  an interstitial type. An endotracheal tube is noted in satisfactory position. Electronically Signed   By: Alcide CleverMark  Lukens M.D.   On: 03/02/2015 13:49   Personally viewed.   Telemetry: Sinus bradycardia. Occasional heart rates in the 40s. No significant pauses, no ventricular tachycardia Personally viewed.   EKG:  Sinus bradycardia with marked ST segment downsloping abnormalities in the lateral leads, no change from prior EKG, EKG consistent with hypertrophic cardiomyopathy.  Cardiac Studies:  Transthoracic echocardiogram pending.   Scheduled Meds: . antiseptic oral rinse  7 mL Mouth Rinse QID  . aspirin  325 mg Oral Daily  . B-complex with vitamin C  1 tablet Oral Daily  . cefUROXime (ZINACEF)  IV  1.5 g Intravenous Q12H  . chlorhexidine gluconate  15 mL Mouth Rinse BID  . docusate sodium  100 mg Oral Daily  . enoxaparin (LOVENOX) injection  40 mg Subcutaneous Q24H  . magnesium sulfate 1 - 4 g bolus IVPB  4 g Intravenous Once  . multivitamin with minerals  1 tablet Oral QHS  . pantoprazole  40 mg Oral Daily  . rosuvastatin  5 mg Oral QHS   Continuous Infusions: . sodium chloride 10 mL/hr at 03/02/15 1427  . fentaNYL infusion INTRAVENOUS 250 mcg/hr (03/03/15 0146)  . lactated ringers 10 mL/hr at 03/02/15 1748  . phenylephrine (NEO-SYNEPHRINE) Adult infusion 100 mcg/min (03/03/15 0645)   PRN Meds:.sodium chloride, alum & mag hydroxide-simeth, fentaNYL (SUBLIMAZE) injection, fentaNYL (SUBLIMAZE) injection, midazolam, midazolam, ondansetron, potassium chloride  Assessment/Plan:  Active Problems:   PAD (peripheral artery disease) (HCC)   Acute respiratory failure with hypoxia (HCC)   Encounter for central line placement   Hypertrophic obstructive cardiomyopathy  - Checking transthoracic echocardiogram, order placed  - Avoiding beta agonist such as epinephrine, norepinephrine, dobutamine, dopamine  - Pulmonary edema seems to be improving, decreased oxygen needs, auto diuresis.  -  Trying to avoid excessive PEEP which would increase intrathoracic pressure which would then decrease preload and potentially worsen obstructive physiology.  - Classic spike and dome appearance to arterial line wave flow.  - Bradycardia does not allow for beta-blockade which would be nice to provide decreased inotropic effect.  - Since she seems to be moving in the right direction, I do not think at this point she requires temporary pacing which would then cause asynchronous contraction of the septum, and hopeful continued improvement of obstructive physiology.  - She was asymptomatic with regards to her hypertrophic cardiomyopathy prior to surgery, i.e. no dyspnea on exertion, no observable physiologic changes when bending over, lifting, Valsalva.  - We have discussed her case with multiple cardiologists.  Elevated troponin  - Minimally elevated, flat, demand ischemia in the setting of hypertrophic obstructive cardiomyopathy and transient hypotension  Peripheral vascular disease  - Status post femoropopliteal bypass-Dr. Edilia Boickson  Hypomagnesemia  - Repleting  Postoperative fever  - Per critical care medicine/Dr. Edilia Boickson  Hopefully as her pulmonary edema continues to improve, she will be able to be extubated which will also improve her overall physiology and hopefully help her return to baseline.  Critical care time 40 minutes-extensive data review, review with team, nurses, discussion with husband.  SKAINS, MARK 03/03/2015, 9:23 AM

## 2015-03-04 ENCOUNTER — Inpatient Hospital Stay (HOSPITAL_COMMUNITY): Payer: Medicare Other

## 2015-03-04 DIAGNOSIS — I422 Other hypertrophic cardiomyopathy: Secondary | ICD-10-CM

## 2015-03-04 DIAGNOSIS — I248 Other forms of acute ischemic heart disease: Secondary | ICD-10-CM

## 2015-03-04 LAB — CBC WITH DIFFERENTIAL/PLATELET
BASOS ABS: 0 10*3/uL (ref 0.0–0.1)
BASOS PCT: 0 %
EOS PCT: 0 %
Eosinophils Absolute: 0 10*3/uL (ref 0.0–0.7)
HCT: 39.6 % (ref 36.0–46.0)
Hemoglobin: 12.7 g/dL (ref 12.0–15.0)
LYMPHS PCT: 13 %
Lymphs Abs: 1.4 10*3/uL (ref 0.7–4.0)
MCH: 29.6 pg (ref 26.0–34.0)
MCHC: 32.1 g/dL (ref 30.0–36.0)
MCV: 92.3 fL (ref 78.0–100.0)
Monocytes Absolute: 1.2 10*3/uL — ABNORMAL HIGH (ref 0.1–1.0)
Monocytes Relative: 10 %
Neutro Abs: 8.7 10*3/uL — ABNORMAL HIGH (ref 1.7–7.7)
Neutrophils Relative %: 77 %
PLATELETS: 140 10*3/uL — AB (ref 150–400)
RBC: 4.29 MIL/uL (ref 3.87–5.11)
RDW: 14.4 % (ref 11.5–15.5)
WBC: 11.3 10*3/uL — ABNORMAL HIGH (ref 4.0–10.5)

## 2015-03-04 LAB — BASIC METABOLIC PANEL
Anion gap: 5 (ref 5–15)
BUN: 15 mg/dL (ref 6–20)
CALCIUM: 8 mg/dL — AB (ref 8.9–10.3)
CO2: 27 mmol/L (ref 22–32)
Chloride: 104 mmol/L (ref 101–111)
Creatinine, Ser: 0.6 mg/dL (ref 0.44–1.00)
GFR calc Af Amer: >60 mL/min (ref >60)
GLUCOSE: 114 mg/dL — AB (ref 65–99)
Potassium: 3.6 mmol/L (ref 3.5–5.1)
Sodium: 136 mmol/L (ref 135–145)

## 2015-03-04 LAB — POCT I-STAT 3, ART BLOOD GAS (G3+)
Acid-Base Excess: 3 mmol/L — ABNORMAL HIGH (ref 0.0–2.0)
Bicarbonate: 26.9 mEq/L — ABNORMAL HIGH (ref 20.0–24.0)
O2 Saturation: 93 %
PCO2 ART: 35.8 mmHg (ref 35.0–45.0)
PH ART: 7.482 — AB (ref 7.350–7.450)
PO2 ART: 61 mmHg — AB (ref 80.0–100.0)
Patient temperature: 98
TCO2: 28 mmol/L (ref 0–100)

## 2015-03-04 LAB — GLUCOSE, CAPILLARY
GLUCOSE-CAPILLARY: 120 mg/dL — AB (ref 65–99)
GLUCOSE-CAPILLARY: 157 mg/dL — AB (ref 65–99)
Glucose-Capillary: 106 mg/dL — ABNORMAL HIGH (ref 65–99)
Glucose-Capillary: 136 mg/dL — ABNORMAL HIGH (ref 65–99)
Glucose-Capillary: 156 mg/dL — ABNORMAL HIGH (ref 65–99)

## 2015-03-04 LAB — PROCALCITONIN: PROCALCITONIN: 0.28 ng/mL

## 2015-03-04 LAB — PHOSPHORUS: PHOSPHORUS: 1.4 mg/dL — AB (ref 2.5–4.6)

## 2015-03-04 LAB — MAGNESIUM: MAGNESIUM: 2.1 mg/dL (ref 1.7–2.4)

## 2015-03-04 MED ORDER — AMIODARONE HCL IN DEXTROSE 360-4.14 MG/200ML-% IV SOLN
30.0000 mg/h | INTRAVENOUS | Status: DC
  Filled 2015-03-04: qty 200

## 2015-03-04 MED ORDER — SODIUM CHLORIDE 0.9 % IJ SOLN
10.0000 mL | INTRAMUSCULAR | Status: DC | PRN
  Administered 2015-03-05: 10 mL
  Filled 2015-03-04: qty 40

## 2015-03-04 MED ORDER — AMIODARONE LOAD VIA INFUSION
150.0000 mg | Freq: Once | INTRAVENOUS | Status: AC
  Administered 2015-03-04: 150 mg via INTRAVENOUS
  Filled 2015-03-04: qty 83.34

## 2015-03-04 MED ORDER — AMIODARONE HCL IN DEXTROSE 360-4.14 MG/200ML-% IV SOLN
60.0000 mg/h | INTRAVENOUS | Status: AC
  Administered 2015-03-04: 60 mg/h via INTRAVENOUS

## 2015-03-04 MED ORDER — SODIUM CHLORIDE 0.9 % IJ SOLN
10.0000 mL | Freq: Two times a day (BID) | INTRAMUSCULAR | Status: DC
  Administered 2015-03-04 – 2015-03-10 (×7): 10 mL
  Administered 2015-03-10: 20 mL
  Administered 2015-03-10 – 2015-03-11 (×2): 10 mL
  Administered 2015-03-11: 20 mL
  Administered 2015-03-12 – 2015-03-13 (×3): 10 mL

## 2015-03-04 MED ORDER — AMIODARONE HCL IN DEXTROSE 360-4.14 MG/200ML-% IV SOLN
INTRAVENOUS | Status: AC
  Filled 2015-03-04: qty 200

## 2015-03-04 NOTE — Progress Notes (Signed)
   VASCULAR SURGERY ASSESSMENT & PLAN:  * 2 Days Post-Op s/p: right femoral to below-knee popliteal artery bypass with composite vein graft (2 pieces right great saphenous vein)  * procedure complicated by intraoperative cardiac decompensation related to hypertrophic obstructive cardiomyopathy.  * Appreciate cardiology's help. The patient is having an echo this morning.  * Appreciate CCM's help: wean per pulmonary.  * discontinue JP  SUBJECTIVE: sedated on vent.  PHYSICAL EXAM: Filed Vitals:   03/04/15 0755 03/04/15 0800 03/04/15 0830 03/04/15 0900  BP:  96/64 115/63 94/62  Pulse:  70 79 78  Temp: 99.8 F (37.7 C)     TempSrc: Axillary     Resp:  27  26  Height:      Weight:      SpO2:  100%  99%   Brisk dorsalis pedis signal with Doppler. Dressings dry.  LABS: Lab Results  Component Value Date   WBC 11.3* 03/04/2015   HGB 12.7 03/04/2015   HCT 39.6 03/04/2015   MCV 92.3 03/04/2015   PLT 140* 03/04/2015   Lab Results  Component Value Date   CREATININE 0.60 03/04/2015   Lab Results  Component Value Date   INR 1.01 02/27/2015   CBG (last 3)   Recent Labs  03/04/15 0002 03/04/15 0339 03/04/15 0753  GLUCAP 136* 106* 120*    Active Problems:   PAD (peripheral artery disease) (HCC)   Acute respiratory failure with hypoxia (HCC)   Encounter for central line placement   Hypertrophic obstructive cardiomyopathy (HCC)   Demand ischemia (HCC)    Cari Carawayhris Dickson Beeper: [phone]: [phone] 03/04/2015

## 2015-03-04 NOTE — Progress Notes (Signed)
Dr. Shirlee LatchMcLean asked me to come by for possible cardioversion. Earlier patient bp lower, but rate controlled now and good blood pressure now on 90 of neo.  Patient alert and not in distress.   Discussed with Dr. Shirlee LatchMcLean, patient and husband and will continue current treatment.  If further difficulty with BP then may do bedside cardioversion.  She is intubated and on versed and fentanyl.   Discussed with nurse about plans.  Darden PalmerW. Spencer Chosen Garron, Jr. MD [facility]FACC 03/04/2015 5:06 PM

## 2015-03-04 NOTE — Progress Notes (Cosign Needed)
Called by RN re: rapid atrial fib.  Pt had R fem-pop 11/17 w/ subsequent pulm edema. Hx HOCM. Still on pressors.   This pm, pt went into rapid atrial fib, ECG without other changes. HR sustained > 125.  She became more hypotensive with this, neo 30>>60 mcg/min  Dr Shirlee LatchMclean in to assess pt.   For now, load w/ amio, bolus and gtt.   Do not believe she will tolerate a rapid arrhythmia well, may need DCCV if does not convert.   Add heparin.  Dr Edilia Boickson aware.  Theodore DemarkBarrett, Rhonda, Cordelia Poche-C 03/04/2015 2:52 PM Beeper [phone]

## 2015-03-04 NOTE — Progress Notes (Signed)
PULMONARY / CRITICAL CARE MEDICINE   Name: SHANAVIA MAKELA MRN: [id number] DOB: [birth year]    ADMISSION DATE:  03/02/2015 CONSULTATION DATE:  03/02/2015   REFERRING MD :  Dr Edilia Bo [facility]  CHIEF COMPLAINT:  Acute post op resp failure   BRIEF  69 year old female with baseline claudication but no dyspnea but has some HOCM (followed by Dr Anne Fu) but at baseline and has had a normal cardiac stress test and is able to climb stairs without dyspnea. He underwent on 03/02/2015 right below knee popliteal artery femoropopliteal bypass and femoral artery endarterectomy on the right. Overall operative time was approximately 4 hours according to anesthesia. First 2 hours no problem but subsequently started having severe evidence of hypertrophic obstructive cardiomyopathy as evidenced by intraoperative echo and periods of hypotension. Status post 4-5 liters of fluid and for albumin infusions. Brought into the intensive care unit postoperatively and was spontaneously weaned off Neo-Synephrine but was found to be in respiratory acidosis with severe hypoxemia and chest x-ray with acute pulmonary edema bilaterally. Pulmonary critical care called for consultation 03/02/15   SUBJECTIVE: Tolerating pressure support.  VITAL SIGNS: Temp:  [98 F (36.7 C)-103 F (39.4 C)] 99.7 F (37.6 C) (11/19 0409) Pulse Rate:  [58-80] 65 (11/19 0700) Resp:  [15-28] 28 (11/19 0700) BP: (70-134)/(48-81) 94/60 mmHg (11/19 0700) SpO2:  [93 %-100 %] 100 % (11/19 0700) Arterial Line BP: (96-174)/(42-73) 113/51 mmHg (11/19 0700) FiO2 (%):  [40 %-80 %] 40 % (11/19 0317) Weight:  [129 lb 3 oz (58.6 kg)-140 lb 6.9 oz (63.7 kg)] 140 lb 6.9 oz (63.7 kg) (11/19 0615) HEMODYNAMICS:   VENTILATOR SETTINGS: Vent Mode:  [-] PRVC FiO2 (%):  [40 %-80 %] 40 % Set Rate:  [28 bmp] 28 bmp Vt Set:  [500 mL] 500 mL PEEP:  [5 cmH20-8 cmH20] 5 cmH20 Plateau Pressure:  [17 cmH20-21 cmH20] 18 cmH20 INTAKE / OUTPUT:  Intake/Output Summary  (Last 24 hours) at 03/04/15 0717 Last data filed at 03/04/15 0700  Gross per 24 hour  Intake 2435.77 ml  Output   1427 ml  Net 1008.77 ml    PHYSICAL EXAMINATION: General: pleasant Neuro:  RASS 0 HEENT:  ETT in place Cardiovascular:  Regular, no murmur Lungs:  Scattered crackles Abdomen:  Soft , non tender Musculoskeletal: no edema, Rt leg in wrap Skin: no rashes  PULMONARY  Recent Labs Lab 03/02/15 1331 03/02/15 1437 03/02/15 1611 03/02/15 2011 03/04/15 0338  PHART 7.159* 7.253* 7.277* 7.381 7.482*  PCO2ART 71.1* 56.1* 46.1* 40.3 35.8  PO2ART 70.0* 73.0* 104.0* 130.0* 61.0*  HCO3 25.5* 24.9* 21.5 24.0 26.9*  TCO2 O2SAT 88.0 92.0 97.0 99.0 93.0    CBC  Recent Labs Lab 03/02/15 2304 03/03/15 0850 03/04/15 0355  HGB 14.5 13.3 12.7  HCT 44.6 40.5 39.6  WBC 15.0* 13.3* 11.3*  PLT 212 187 140*    COAGULATION  Recent Labs Lab 02/27/15 1128  INR 1.01    CARDIAC    Recent Labs Lab 03/02/15 1055 03/02/15 1730 03/02/15 2304  TROPONINI 0.03 0.04* 0.06*   No results for input(s): PROBNP in the last 168 hours.   CHEMISTRY  Recent Labs Lab 02/27/15 1128 03/02/15 1227 03/02/15 2304 03/03/15 0420 03/03/15 0850 03/04/15 0355  NA 143 139 139  --  137 136  K 4.4 4.0 3.9  --  3.3* 3.6  CL 108  --  106  --  103 104  CO2 28  --  25  --  27 27  GLUCOSE 97  --  104*  --  95 114*  BUN 33*  --  10  --  10 15  CREATININE 0.74  --  0.63  --  0.69 0.60  CALCIUM 9.6  --  8.3*  --  8.1* 8.0*  MG  --   --   --  1.2*  --  2.1  PHOS  --   --   --  2.8  --  1.4*   Estimated Creatinine Clearance: 59.7 mL/min (by C-G formula based on Cr of 0.6).   LIVER  Recent Labs Lab 02/27/15 1128  AST 23  ALT 18  ALKPHOS 115  BILITOT 0.4  PROT 7.0  ALBUMIN 3.8  INR 1.01     INFECTIOUS  Recent Labs Lab 03/03/15 0850 03/04/15 0355  LATICACIDVEN 1.3  --   PROCALCITON <0.10 0.28     ENDOCRINE CBG (last 3)   Recent Labs   03/03/15 1930 03/04/15 0002 03/04/15 0339  GLUCAP 84 136* 106*     IMAGING x48h Dg Chest Port 1 View  03/03/2015  CLINICAL DATA:  Check endotracheal tube placement EXAM: PORTABLE CHEST - 1 VIEW COMPARISON:  03/02/2015 FINDINGS: Cardiac shadow is stable. An endotracheal tube, nasogastric catheter and left jugular central line are seen in satisfactory position. Slight increase in the degree of infiltrative density in the right mid and lower lung is noted. Slight improved aeration on the left is seen. IMPRESSION: Slight improved aeration in the left base. Increasing right basilar infiltrate. Electronically Signed   By: Alcide Clever M.D.   On: 03/03/2015 07:42   Dg Chest Portable 1 View  03/02/2015  CLINICAL DATA:  Central line placement, hypertension, hypertrophic cardiomyopathy, atrial fibrillation, former smoker, GERD EXAM: PORTABLE CHEST 1 VIEW COMPARISON:  Portable exam 1704 hours compared to 03/02/2015 FINDINGS: Tip of endotracheal tube projects 4.7 cm above carina. New LEFT jugular central venous catheter tip projecting over SVC. Stable heart size and mediastinal contours. Pulmonary vascular congestion. COPD changes with perihilar infiltrates likely pulmonary edema. No gross pleural effusion or pneumothorax. Bones demineralized. Capsular calcification in the RIGHT breast prosthesis. IMPRESSION: No pneumothorax following LEFT jugular line placement. Pulmonary vascular congestion and perihilar infiltrates question pulmonary edema, question minimally improved since previous study. Electronically Signed   By: Ulyses Southward M.D.   On: 03/02/2015 17:16   Dg Chest Port 1 View  03/02/2015  CLINICAL DATA:  Recent intubation, status post femoral popliteal surgery EXAM: PORTABLE CHEST - 1 VIEW COMPARISON:  09/19/2012 FINDINGS: The cardiac shadow is stable. Diffuse vascular congestion is seen. Calcified breast implant is noted on the right. The left breast implant is not calcified. Interstitial and alveolar  edema is noted. Endotracheal tube is seen in satisfactory position approximately 4.3 cm above the carina. No bony abnormality is noted. IMPRESSION: Vascular congestion and pulmonary edema predominately of an interstitial type. An endotracheal tube is noted in satisfactory position. Electronically Signed   By: Alcide Clever M.D.   On: 03/02/2015 13:49       ASSESSMENT / PLAN:  PULMONARY ETT 11/17 >> A:  Acute respiratory failure 2nd to acute pulmonary edema. P:   Pressure support wean as tolerated >> might be ready for extubation soon  CARDIOVASCULAR Lt IJ CVL 11/17 >> A:  Hypertrophic cardiomyopathy with cardiogenic shock. S/p Rt femo-pop bypass. Hx of HTN, HLD. P:  Use phenylephrine to maintain MAP > 65 Avoid tachycardia Post-op care per VVS Continue crestor, ASA Hold outpt norvasc, cozaar, bystolic  RENAL A:   Hypomagnesemia. P:   Replace electrolytes as needed  GASTROINTESTINAL A:  Nutrition. P:   Tube feeds while on vent Protonix for SUP  HEMATOLOGIC A:   No acute issues. P:  F/u CBC Lovenox for DVT prophylaxis  INFECTIOUS A:  Fever with increasing respiratory secretions 11/19. Hx of PCN allergy. P:   Day 2 vancomycin, levaquin  Blood 11/18 >> Sputum 11/18 >>  ENDOCRINE A:   No acute issues.  P:   Monitor CBGs while on tube feeds  NEUROLOGIC A:  Hx of insomnia, anxiety. P:   RASS goal: 0  Hold outpt wellbutrin, ultram, ambien for now   CC time 33 minutes.  Coralyn HellingVineet Carlise Stofer, MD [facility] Pulmonary/Critical Care 03/04/2015, 7:24 AM Pager:  [phone] After 3pm call: [phone]

## 2015-03-04 NOTE — Progress Notes (Signed)
Pt went from NSR to Afib RVR with rates up to the 160s. Pt also became hypotensive, requiring Neo increase from 30 to 60 to maintain MAPs. Theodore Demarkhonda Barrett, PA, notified. 12lead EKG performed. Rhonda and Dr. Shirlee LatchMcLean in to assess patient. Amio bolus and gtt started per order.   Wilmon ArmsLaura Madeliene Tejera, RN

## 2015-03-04 NOTE — Progress Notes (Signed)
  Echocardiogram 2D Echocardiogram has been performed.  Beverly Li, Beverly Li 03/04/2015, 9:58 AM

## 2015-03-04 NOTE — Progress Notes (Signed)
Subjective:  Intubated; alert; no chest pain or dyspnea.  Objective:  Vital Signs in the last 24 hours: Temp:  [98 F (36.7 C)-103 F (39.4 C)] 99.8 F (37.7 C) (11/19 0755) Pulse Rate:  [58-80] 79 (11/19 0830) Resp:  [15-28] 27 (11/19 0800) BP: (70-134)/(48-81) 115/63 mmHg (11/19 0830) SpO2:  [93 %-100 %] 100 % (11/19 0800) Arterial Line BP: (96-174)/(42-73) 113/51 mmHg (11/19 0700) FiO2 (%):  [40 %-50 %] 40 % (11/19 0830) Weight:  [58.6 kg (129 lb 3 oz)-63.7 kg (140 lb 6.9 oz)] 63.7 kg (140 lb 6.9 oz) (11/19 0615)  Intake/Output from previous day: 11/18 0701 - 11/19 0700 In: 2435.8 [I.V.:1235.8; NG/GT:850; IV Piggyback:350] Out: 1427 [Urine:1360; Drains:67]   Physical Exam: General: Intubated; alert Head:  Normal Lungs: CTA anteriorly Heart: RRR; 2/6 systolic murmur left lower sternal border Abdomen: soft, non-tender, no masses Extremities: No edema. Postsurgical scars noted Neurologic: A and O     Lab Results:  Recent Labs  03/03/15 0850 03/04/15 0355  WBC 13.3* 11.3*  HGB 13.3 12.7  PLT 187 140*    Recent Labs  03/03/15 0850 03/04/15 0355  NA 137 136  K 3.3* 3.6  CL 103 104  CO2 27 27  GLUCOSE 95 114*  BUN 10 15  CREATININE 0.69 0.60    Recent Labs  03/02/15 1730 03/02/15 2304  TROPONINI 0.04* 0.06*    Imaging: Dg Chest Port 1 View  03/04/2015  CLINICAL DATA:  Subsequent encounter. Respiratory distress. Intubated patient. EXAM: PORTABLE CHEST 1 VIEW COMPARISON:  03/03/2015 FINDINGS: Hazy airspace opacity in the mid to lower lungs has improved on the right compared to the prior exam. Some of this may be a technical change only. There are persistent irregular interstitial opacities in the mid and lower lungs bilaterally. The upper lungs are clear. Lungs remain hyperexpanded. No convincing pleural effusion or pneumothorax. Cardiac silhouette is mildly enlarged. Endotracheal tube, left internal jugular central venous line and orogastric tube  are stable and well positioned. IMPRESSION: 1. Improved aeration of the right mid to lower lung when compared to the previous day's study. 2. No other change. 3. There persistent mid and lower lung zone opacities which may reflect bilateral pneumonia or pulmonary edema superimposed on changes of COPD, the latter is favored. 4. Support apparatus is stable and well positioned. Electronically Signed   By: Amie Portland M.D.   On: 03/04/2015 07:41   Dg Chest Port 1 View  03/03/2015  CLINICAL DATA:  Check endotracheal tube placement EXAM: PORTABLE CHEST - 1 VIEW COMPARISON:  03/02/2015 FINDINGS: Cardiac shadow is stable. An endotracheal tube, nasogastric catheter and left jugular central line are seen in satisfactory position. Slight increase in the degree of infiltrative density in the right mid and lower lung is noted. Slight improved aeration on the left is seen. IMPRESSION: Slight improved aeration in the left base. Increasing right basilar infiltrate. Electronically Signed   By: Alcide Clever M.D.   On: 03/03/2015 07:42   Dg Chest Portable 1 View  03/02/2015  CLINICAL DATA:  Central line placement, hypertension, hypertrophic cardiomyopathy, atrial fibrillation, former smoker, GERD EXAM: PORTABLE CHEST 1 VIEW COMPARISON:  Portable exam 1704 hours compared to 03/02/2015 FINDINGS: Tip of endotracheal tube projects 4.7 cm above carina. New LEFT jugular central venous catheter tip projecting over SVC. Stable heart size and mediastinal contours. Pulmonary vascular congestion. COPD changes with perihilar infiltrates likely pulmonary edema. No gross pleural effusion or pneumothorax. Bones demineralized. Capsular calcification in the RIGHT  breast prosthesis. IMPRESSION: No pneumothorax following LEFT jugular line placement. Pulmonary vascular congestion and perihilar infiltrates question pulmonary edema, question minimally improved since previous study. Electronically Signed   By: Ulyses SouthwardMark  Boles M.D.   On: 03/02/2015  17:16   Dg Chest Port 1 View  03/02/2015  CLINICAL DATA:  Recent intubation, status post femoral popliteal surgery EXAM: PORTABLE CHEST - 1 VIEW COMPARISON:  09/19/2012 FINDINGS: The cardiac shadow is stable. Diffuse vascular congestion is seen. Calcified breast implant is noted on the right. The left breast implant is not calcified. Interstitial and alveolar edema is noted. Endotracheal tube is seen in satisfactory position approximately 4.3 cm above the carina. No bony abnormality is noted. IMPRESSION: Vascular congestion and pulmonary edema predominately of an interstitial type. An endotracheal tube is noted in satisfactory position. Electronically Signed   By: Alcide CleverMark  Lukens M.D.   On: 03/02/2015 13:49     Telemetry: Sinus   Cardiac Studies:  Transthoracic echocardiogram pending.   Scheduled Meds: . antiseptic oral rinse  7 mL Mouth Rinse QID  . aspirin  324 mg Oral Daily  . B-complex with vitamin C  1 tablet Oral Daily  . chlorhexidine gluconate  15 mL Mouth Rinse BID  . docusate sodium  100 mg Oral Daily  . enoxaparin (LOVENOX) injection  40 mg Subcutaneous Q24H  . feeding supplement (PRO-STAT SUGAR FREE 64)  30 mL Per Tube Daily  . levofloxacin (LEVAQUIN) IV  750 mg Intravenous Q24H  . multivitamin with minerals  1 tablet Oral QHS  . pantoprazole sodium  40 mg Per Tube Daily  . rosuvastatin  5 mg Oral QHS  . sodium chloride  10-40 mL Intracatheter Q12H  . vancomycin  750 mg Intravenous Q12H   Continuous Infusions: . sodium chloride 10 mL/hr at 03/02/15 1427  . feeding supplement (JEVITY 1.2 CAL) 1,000 mL (03/04/15 0700)  . fentaNYL infusion INTRAVENOUS 50 mcg/hr (03/04/15 0700)  . lactated ringers 10 mL/hr at 03/02/15 1748  . phenylephrine (NEO-SYNEPHRINE) Adult infusion 30 mcg/min (03/04/15 0700)   PRN Meds:.acetaminophen (TYLENOL) oral liquid 160 mg/5 mL, fentaNYL (SUBLIMAZE) injection, midazolam, midazolam, ondansetron, sodium chloride  Assessment/Plan:  Active  Problems:   PAD (peripheral artery disease) (HCC)   Acute respiratory failure with hypoxia (HCC)   Encounter for central line placement   Hypertrophic obstructive cardiomyopathy (HCC)   Demand ischemia (HCC)   Hypertrophic obstructive cardiomyopathy  - Echo being performed this AM; normal LV function with severe SAM and LVOT gradient of 6 m/s (final results pending). Pulmonary edema likely related to obstructive physiology.  - Avoiding beta agonist such as epinephrine, norepinephrine, dobutamine, dopamine; when phenylephrine as tolerated.  - Avoid diuresis if possible as this would exacerbate HOCM physiology by decreasing preload. Chest xray appears to be improving.  - Trying to avoid excessive PEEP which would increase intrathoracic pressure which would then decrease preload and potentially worsen obstructive physiology.  - Resume beta blocker later as BP and pulse allow  Elevated troponin  - Minimally elevated, flat, demand ischemia in the setting of hypertrophic obstructive cardiomyopathy and transient hypotension  Peripheral vascular disease  - Status post femoropopliteal bypass-Dr. Edilia Boickson  Hypomagnesemia  - Resolved  Postoperative fever  - ? Pneumonia; antibiotics initiated. Per critical care medicine/Dr. Edilia Boickson  VDRF  - Hopefully as her pulmonary edema continues to improve, she will be able to be extubated which will also improve her overall physiology and hopefully help her return to baseline. CCM managing vent.  Beverly MillersBrian Cliffton Li 03/04/2015, 9:14 AM

## 2015-03-04 NOTE — Plan of Care (Signed)
Problem: Phase I Progression Outcomes Goal: Graft patent without s/s of occlusion Outcome: Progressing (+) Biphasic doppler of Rt PT/DP pulses, foot remains warm to touch. Goal: Weaning O2 to maintain Sats > 90% Outcome: Progressing Vent weaned to 40% from 100% post op, maintaining goos O2 saturations Goal: Hemodynamically stable Outcome: Not Progressing Pt requiring Neosyn. titration for low SBP related to hypertrophic cardiomyopathy per Dr Anne FuSkains

## 2015-03-05 ENCOUNTER — Inpatient Hospital Stay (HOSPITAL_COMMUNITY): Payer: Medicare Other

## 2015-03-05 LAB — POCT I-STAT 3, ART BLOOD GAS (G3+)
ACID-BASE EXCESS: 4 mmol/L — AB (ref 0.0–2.0)
ACID-BASE EXCESS: 3 mmol/L — AB (ref 0.0–2.0)
BICARBONATE: 29.7 meq/L — AB (ref 20.0–24.0)
BICARBONATE: 30.5 meq/L — AB (ref 20.0–24.0)
O2 SAT: 92 %
O2 Saturation: 91 %
PH ART: 7.417 (ref 7.350–7.450)
PH ART: 7.341 — AB (ref 7.350–7.450)
PO2 ART: 63 mmHg — AB (ref 80.0–100.0)
TCO2: 31 mmol/L (ref 0–100)
TCO2: 32 mmol/L (ref 0–100)
pCO2 arterial: 46.3 mmHg — ABNORMAL HIGH (ref 35.0–45.0)
pCO2 arterial: 56.5 mmHg — ABNORMAL HIGH (ref 35.0–45.0)
pO2, Arterial: 68 mmHg — ABNORMAL LOW (ref 80.0–100.0)

## 2015-03-05 LAB — BASIC METABOLIC PANEL
Anion gap: 6 (ref 5–15)
BUN: 16 mg/dL (ref 6–20)
CALCIUM: 7.2 mg/dL — AB (ref 8.9–10.3)
CHLORIDE: 104 mmol/L (ref 101–111)
CO2: 26 mmol/L (ref 22–32)
CREATININE: 0.59 mg/dL (ref 0.44–1.00)
GFR calc non Af Amer: >60 mL/min (ref >60)
Glucose, Bld: 138 mg/dL — ABNORMAL HIGH (ref 65–99)
Potassium: 3.6 mmol/L (ref 3.5–5.1)
SODIUM: 136 mmol/L (ref 135–145)

## 2015-03-05 LAB — CBC
HCT: 38.3 % (ref 36.0–46.0)
HEMOGLOBIN: 12.2 g/dL (ref 12.0–15.0)
MCH: 29.6 pg (ref 26.0–34.0)
MCHC: 31.9 g/dL (ref 30.0–36.0)
MCV: 93 fL (ref 78.0–100.0)
PLATELETS: 159 10*3/uL (ref 150–400)
RBC: 4.12 MIL/uL (ref 3.87–5.11)
RDW: 14.5 % (ref 11.5–15.5)
WBC: 10.7 10*3/uL — AB (ref 4.0–10.5)

## 2015-03-05 LAB — GLUCOSE, CAPILLARY: GLUCOSE-CAPILLARY: 120 mg/dL — AB (ref 65–99)

## 2015-03-05 LAB — PROCALCITONIN: Procalcitonin: 0.24 ng/mL

## 2015-03-05 LAB — MAGNESIUM: MAGNESIUM: 1.9 mg/dL (ref 1.7–2.4)

## 2015-03-05 LAB — PHOSPHORUS: Phosphorus: 1.6 mg/dL — ABNORMAL LOW (ref 2.5–4.6)

## 2015-03-05 MED ORDER — BUPROPION HCL ER (SR) 150 MG PO TB12
150.0000 mg | ORAL_TABLET | Freq: Two times a day (BID) | ORAL | Status: DC
  Administered 2015-03-05 (×2): 150 mg via ORAL
  Filled 2015-03-05 (×5): qty 1

## 2015-03-05 MED ORDER — POTASSIUM PHOSPHATES 15 MMOLE/5ML IV SOLN
20.0000 mmol | Freq: Once | INTRAVENOUS | Status: AC
  Administered 2015-03-05: 20 mmol via INTRAVENOUS
  Filled 2015-03-05: qty 6.67

## 2015-03-05 MED ORDER — ALBUTEROL SULFATE (2.5 MG/3ML) 0.083% IN NEBU
2.5000 mg | INHALATION_SOLUTION | RESPIRATORY_TRACT | Status: DC | PRN
  Administered 2015-03-14 – 2015-03-15 (×2): 2.5 mg via RESPIRATORY_TRACT
  Filled 2015-03-05 (×2): qty 3

## 2015-03-05 MED ORDER — IPRATROPIUM-ALBUTEROL 0.5-2.5 (3) MG/3ML IN SOLN
3.0000 mL | Freq: Four times a day (QID) | RESPIRATORY_TRACT | Status: DC
  Administered 2015-03-05 – 2015-03-14 (×32): 3 mL via RESPIRATORY_TRACT
  Filled 2015-03-05 (×34): qty 3

## 2015-03-05 MED ORDER — BUDESONIDE 0.5 MG/2ML IN SUSP
0.5000 mg | Freq: Two times a day (BID) | RESPIRATORY_TRACT | Status: DC
  Administered 2015-03-05 – 2015-03-15 (×21): 0.5 mg via RESPIRATORY_TRACT
  Filled 2015-03-05 (×35): qty 2

## 2015-03-05 MED ORDER — POTASSIUM CHLORIDE 20 MEQ/15ML (10%) PO SOLN
20.0000 meq | ORAL | Status: DC
  Administered 2015-03-05: 20 meq
  Filled 2015-03-05: qty 15

## 2015-03-05 NOTE — Progress Notes (Signed)
eLink Physician-Brief Progress Note Patient Name: Hyacinth MeekerFrances B Burgard DOB: 14-Apr-1946 MRN: [id number]   Date of Service  03/05/2015  HPI/Events of Note  Request for bilateral wrist restraints  eICU Interventions  Will order bilateral wrist restraints.     Intervention Category Minor Interventions: Agitation / anxiety - evaluation and management  Sommer,Steven Eugene 03/05/2015, 4:08 AM

## 2015-03-05 NOTE — Progress Notes (Signed)
PULMONARY / CRITICAL CARE MEDICINE   Name: Beverly Li MRN: [id number] DOB: 11-Mar-1946    ADMISSION DATE:  03/02/2015 CONSULTATION DATE:  03/02/2015   REFERRING MD :  Dr Edilia Bo [facility]  CHIEF COMPLAINT:  Acute post op resp failure   BRIEF: 69 year old female with baseline claudication but no dyspnea but has some HOCM (followed by Dr Anne Fu) but at baseline and has had a normal cardiac stress test and is able to climb stairs without dyspnea. He underwent on 03/02/2015 right below knee popliteal artery femoropopliteal bypass and femoral artery endarterectomy on the right. Overall operative time was approximately 4 hours according to anesthesia. First 2 hours no problem but subsequently started having severe evidence of hypertrophic obstructive cardiomyopathy as evidenced by intraoperative echo and periods of hypotension. Status post 4-5 liters of fluid and for albumin infusions. Brought into the intensive care unit postoperatively and was spontaneously weaned off Neo-Synephrine but was found to be in respiratory acidosis with severe hypoxemia and chest x-ray with acute pulmonary edema bilaterally. Pulmonary critical care called for consultation 03/02/15  SUBJECTIVE: A fib with RVR yesterday >> back in sinus.  VITAL SIGNS: Temp:  [98.4 F (36.9 C)-100.8 F (38.2 C)] 100.8 F (38.2 C) (11/20 0359) Pulse Rate:  [49-142] 59 (11/20 0700) Resp:  [11-29] 26 (11/20 0700) BP: (63-196)/(39-130) 88/57 mmHg (11/20 0700) SpO2:  [79 %-100 %] 99 % (11/20 0700) Arterial Line BP: (63-239)/(36-173) 114/109 mmHg (11/20 0700) FiO2 (%):  [40 %] 40 % (11/20 0305) Weight:  [145 lb 15.1 oz (66.2 kg)] 145 lb 15.1 oz (66.2 kg) (11/20 0500) HEMODYNAMICS:   VENTILATOR SETTINGS: Vent Mode:  [-] PRVC FiO2 (%):  [40 %] 40 % Set Rate:  [26 bmp-28 bmp] 26 bmp Vt Set:  [500 mL] 500 mL PEEP:  [5 cmH20] 5 cmH20 Pressure Support:  [8 cmH20] 8 cmH20 Plateau Pressure:  [18 cmH20-24 cmH20] 22 cmH20 INTAKE /  OUTPUT:  Intake/Output Summary (Last 24 hours) at 03/05/15 0736 Last data filed at 03/05/15 0600  Gross per 24 hour  Intake 3304.75 ml  Output   1306 ml  Net 1998.75 ml    PHYSICAL EXAMINATION: General: pleasant Neuro:  RASS 0 HEENT:  ETT in place Cardiovascular:  Regular, no murmur Lungs:  Prolonged exhalation, b/l expiratory wheeze Abdomen:  Soft , non tender Musculoskeletal: no edema, Rt leg in wrap Skin: no rashes  PULMONARY  Recent Labs Lab 03/02/15 1437 03/02/15 1611 03/02/15 2011 03/04/15 0338 03/05/15 0349  PHART 7.253* 7.277* 7.381 7.482* 7.417  PCO2ART 56.1* 46.1* 40.3 35.8 46.3*  PO2ART 73.0* 104.0* 130.0* 61.0* 63.0*  HCO3 24.9* 21.5 24.0 26.9* 29.7*  TCO2 O2SAT 92.0 97.0 99.0 93.0 91.0    CBC  Recent Labs Lab 03/03/15 0850 03/04/15 0355 03/05/15 0414  HGB 13.3 12.7 12.2  HCT 40.5 39.6 38.3  WBC 13.3* 11.3* 10.7*  PLT 187 140* 159    COAGULATION  Recent Labs Lab 02/27/15 1128  INR 1.01    CARDIAC    Recent Labs Lab 03/02/15 1055 03/02/15 1730 03/02/15 2304  TROPONINI 0.03 0.04* 0.06*   No results for input(s): PROBNP in the last 168 hours.   CHEMISTRY  Recent Labs Lab 02/27/15 1128 03/02/15 1227 03/02/15 2304 03/03/15 0420 03/03/15 0850 03/04/15 0355 03/05/15 0414  NA 143 139 139  --  137 136 136  K 4.4 4.0 3.9  --  3.3* 3.6 3.6  CL 108  --  106  --  103 104 104  CO2 28  --  25  --  GLUCOSE 97  --  104*  --  95 114* 138*  BUN 33*  --  10  --  CREATININE 0.74  --  0.63  --  0.69 0.60 0.59  CALCIUM 9.6  --  8.3*  --  8.1* 8.0* 7.2*  MG  --   --   --  1.2*  --  2.1 1.9  PHOS  --   --   --  2.8  --  1.4* 1.6*   Estimated Creatinine Clearance: 59.7 mL/min (by C-G formula based on Cr of 0.59).   LIVER  Recent Labs Lab 02/27/15 1128  AST 23  ALT 18  ALKPHOS 115  BILITOT 0.4  PROT 7.0  ALBUMIN 3.8  INR 1.01     INFECTIOUS  Recent Labs Lab 03/03/15 0850  03/04/15 0355 03/05/15 0414  LATICACIDVEN 1.3  --   --   PROCALCITON <0.10 0.28 0.24     ENDOCRINE CBG (last 3)   Recent Labs  03/04/15 0753 03/04/15 1128 03/04/15 1553  GLUCAP 120* 157* 156*     IMAGING x48h Dg Chest Port 1 View  03/04/2015  CLINICAL DATA:  Subsequent encounter. Respiratory distress. Intubated patient. EXAM: PORTABLE CHEST 1 VIEW COMPARISON:  03/03/2015 FINDINGS: Hazy airspace opacity in the mid to lower lungs has improved on the right compared to the prior exam. Some of this may be a technical change only. There are persistent irregular interstitial opacities in the mid and lower lungs bilaterally. The upper lungs are clear. Lungs remain hyperexpanded. No convincing pleural effusion or pneumothorax. Cardiac silhouette is mildly enlarged. Endotracheal tube, left internal jugular central venous line and orogastric tube are stable and well positioned. IMPRESSION: 1. Improved aeration of the right mid to lower lung when compared to the previous day's study. 2. No other change. 3. There persistent mid and lower lung zone opacities which may reflect bilateral pneumonia or pulmonary edema superimposed on changes of COPD, the latter is favored. 4. Support apparatus is stable and well positioned. Electronically Signed   By: Amie Portland M.D.   On: 03/04/2015 07:41       ASSESSMENT / PLAN:  PULMONARY ETT 11/17 >> A:  Acute respiratory failure 2nd to acute pulmonary edema. Wheezing with hx of smoking >> ?if she has COPD with exacerbation. P:   Change to pressure control 11/20 F/u CXR, ABG Scheduled BDs with duoneb and pulmicort  CARDIOVASCULAR Lt IJ CVL 11/17 >> A:  Hypertrophic cardiomyopathy with cardiogenic shock. A fib with RVR 11/20 >> back in sinus rhythm. S/p Rt femo-pop bypass. Hx of HTN, HLD. P:  Use phenylephrine to maintain MAP > 65 Avoid tachycardia Post-op care per VVS Continue crestor, ASA Hold outpt norvasc, cozaar, bystolic  RENAL A:    Hypomagnesemia. Hypophosphatemia. Hypokalemia. P:   Replace electrolytes as needed  GASTROINTESTINAL A:  Nutrition. P:   Tube feeds while on vent Protonix for SUP  HEMATOLOGIC A:   No acute issues. P:  F/u CBC Lovenox for DVT prophylaxis  INFECTIOUS A:  Fever with increasing respiratory secretions 11/19. Hx of PCN allergy. P:   Day 3 vancomycin, levaquin  Blood 11/18 >> Sputum 11/18 >>  ENDOCRINE A:   No acute issues.  P:   Monitor CBGs while on tube feeds  NEUROLOGIC A:  Hx of insomnia, anxiety. P:   RASS goal: 0  Resume wellbutrin Hold outpt  ultram, ambien for now   CC time 36 minutes.  Coralyn HellingVineet Reveca Desmarais, MD [facility] Pulmonary/Critical Care 03/05/2015, 7:36 AM Pager:  [phone] After 3pm call: [phone]

## 2015-03-05 NOTE — Plan of Care (Signed)
Problem: Tissue Perfusion: Goal: Risk factors for ineffective tissue perfusion will decrease Outcome: Completed/Met Date Met:  03/05/15 Pt on Lovenox and SCD to the Lt LE for VTE prevention.  Problem: Nutrition: Goal: Adequate nutrition will be maintained Outcome: Completed/Met Date Met:  03/05/15 Nutrition consult on 03-03-15, recommending Jevity 1.2 at a goal of 60 cc/hr. Pt tolerating TF without sidse effects.     

## 2015-03-05 NOTE — Progress Notes (Signed)
Pt with repeated bouts of junctional rhythm and sinus bradycardia on amiodarone drip, Dr Mayford Knifeurner contacted and discussed Pt poor hemodynamic when in junctional rhythm. Order received to stop amiodarone infusion. Will continue to monitor and assess.

## 2015-03-05 NOTE — Progress Notes (Signed)
Pt noted to be frequently reminded of necessity of ETT and ventilator and that manipulating them could cause harm if self-extubated. Pt nods agreement and continues to pull at ETT. Safety mittens in place and Pt continues to move ETT. Frequent verbal instructed provided without compliance with safety plan. Plan discussed previously with husband and an explanation provided that soft wrist restraints might have to be applied if Pt wasn't compliant with safety plan, stating understanding. Order obtained for bilateral soft wrist restraint. Applied per protocol and will continue to attempt discontinuation attempts.

## 2015-03-05 NOTE — Progress Notes (Signed)
Spoke with Dr. Craige CottaSood at bedside regarding loss of arterial line and questionable need for replacement of arterial line due to patient still being on Neo. Since cuff was correlating with arterial line previously, per Dr. Craige CottaSood, it is ok to not replace the arterial line at this time. RT also made aware.  Wilmon ArmsLaura Giuliana Handyside, RN

## 2015-03-05 NOTE — Progress Notes (Signed)
K+= 3.6 and creat=.59 w/ urine o/p > 30cc/hr; PCCM adult electrolyte replacement protocol initiated with KDur per tube x 2, now and then in 4 hrs.

## 2015-03-05 NOTE — Progress Notes (Signed)
   VASCULAR SURGERY ASSESSMENT & PLAN:  * 3 Days Post-Op s/p: right femoral to below-knee popliteal artery bypass with composite vein graft. Graft is patent with brisk Doppler flow in the right foot. Her drains are out.  *  CARDIAC: She developed rapid A. Fib yesterday and was being considered for cardioversion. Her rate improved however and she did not require cardioversion. Echo yesterday shows normal LV function with severe SAM and LVOT. Thus, it was felt that her pulmonary edema is likely related to this obstructive physiology. Greatly appreciate cardiology's help  * PULMONARY: Did not do well with weaning yesterday. Hopefully she can wean again today as when her sedation wears off she becomes very anxious about her ET tube and this exacerbates her cardiac issues.  * NUTRITION: Tolerating tube feeds  SUBJECTIVE: Sedated on vent  PHYSICAL EXAM: Filed Vitals:   03/05/15 0615 03/05/15 0630 03/05/15 0645 03/05/15 0700  BP: 196/107 86/54 91/58 88/57   Pulse: 70 63 59 59  Temp:      TempSrc:      Resp: 18 26 26 26   Height:      Weight:      SpO2: 89% 98% 99% 99%   Incisions look fine. Brisk Doppler flow right dorsalis pedis and posterior tibial positions.  LABS: Lab Results  Component Value Date   WBC 10.7* 03/05/2015   HGB 12.2 03/05/2015   HCT 38.3 03/05/2015   MCV 93.0 03/05/2015   PLT 159 03/05/2015   Lab Results  Component Value Date   CREATININE 0.59 03/05/2015   Lab Results  Component Value Date   INR 1.01 02/27/2015   CBG (last 3)   Recent Labs  03/04/15 0753 03/04/15 1128 03/04/15 1553  GLUCAP 120* 157* 156*    Active Problems:   PAD (peripheral artery disease) (HCC)   Acute respiratory failure with hypoxia (HCC)   Encounter for central line placement   Hypertrophic obstructive cardiomyopathy (HCC)   Demand ischemia (HCC)   Cari Carawayhris Alika Saladin Beeper: [phone]: [phone] 03/05/2015

## 2015-03-05 NOTE — Progress Notes (Signed)
Subjective:  Intubated; alert; no chest pain or dyspnea.  Objective:  Vital Signs in the last 24 hours: Temp:  [97.8 F (36.6 C)-100.8 F (38.2 C)] 97.8 F (36.6 C) (11/20 0755) Pulse Rate:  [49-142] 72 (11/20 1000) Resp:  [10-27] 13 (11/20 1000) BP: (63-196)/(39-130) 99/60 mmHg (11/20 1000) SpO2:  [79 %-100 %] 93 % (11/20 1000) Arterial Line BP: (63-239)/(36-173) 72/67 mmHg (11/20 1000) FiO2 (%):  [40 %] 40 % (11/20 0825) Weight:  [66.2 kg (145 lb 15.1 oz)] 66.2 kg (145 lb 15.1 oz) (11/20 0500)  Intake/Output from previous day: 11/19 0701 - 11/20 0700 In: 3304.8 [I.V.:1284.8; NG/GT:1570; IV Piggyback:450] Out: 1306 [Urine:1285; Drains:21]   Physical Exam: General: Intubated; alert Head:  Normal Lungs: CTA anteriorly Heart: RRR; 2/6 systolic murmur left lower sternal border Abdomen: soft, non-tender, no masses Extremities: No edema. Postsurgical scars noted Neurologic: A and O     Lab Results:  Recent Labs  03/04/15 0355 03/05/15 0414  WBC 11.3* 10.7*  HGB 12.7 12.2  PLT 140* 159    Recent Labs  03/04/15 0355 03/05/15 0414  NA 136 136  K 3.6 3.6  CL 104 104  CO2 27 26  GLUCOSE 114* 138*  BUN 15 16  CREATININE 0.60 0.59    Recent Labs  03/02/15 1730 03/02/15 2304  TROPONINI 0.04* 0.06*    Imaging: Dg Chest Port 1 View  03/05/2015  CLINICAL DATA:  Respiratory failure. EXAM: PORTABLE CHEST 1 VIEW COMPARISON:  03/04/2015 FINDINGS: Endotracheal tube, enteric catheter, left internal jugular approach central venous catheter stable. Cardiomediastinal silhouette is enlarged. Mediastinal contours appear intact. There is no evidence of pneumothorax. Bilateral lower lobe predominance airspace opacities are not significantly changed. Osseous structures are without acute abnormality. Soft tissues are grossly normal. IMPRESSION: No significant change in the aeration of the lungs with bilateral lower lobe airspace opacities, which may represent bilateral  pleural effusions with atelectasis or airspace consolidation. Stable supporting lines and tubes. Electronically Signed   By: Ted Mcalpine M.D.   On: 03/05/2015 09:05   Dg Chest Port 1 View  03/04/2015  CLINICAL DATA:  Subsequent encounter. Respiratory distress. Intubated patient. EXAM: PORTABLE CHEST 1 VIEW COMPARISON:  03/03/2015 FINDINGS: Hazy airspace opacity in the mid to lower lungs has improved on the right compared to the prior exam. Some of this may be a technical change only. There are persistent irregular interstitial opacities in the mid and lower lungs bilaterally. The upper lungs are clear. Lungs remain hyperexpanded. No convincing pleural effusion or pneumothorax. Cardiac silhouette is mildly enlarged. Endotracheal tube, left internal jugular central venous line and orogastric tube are stable and well positioned. IMPRESSION: 1. Improved aeration of the right mid to lower lung when compared to the previous day's study. 2. No other change. 3. There persistent mid and lower lung zone opacities which may reflect bilateral pneumonia or pulmonary edema superimposed on changes of COPD, the latter is favored. 4. Support apparatus is stable and well positioned. Electronically Signed   By: Amie Portland M.D.   On: 03/04/2015 07:41     Telemetry: Sinus   Cardiac Studies:  Transthoracic echocardiogram pending.   Scheduled Meds: . antiseptic oral rinse  7 mL Mouth Rinse QID  . aspirin  324 mg Oral Daily  . B-complex with vitamin C  1 tablet Oral Daily  . budesonide (PULMICORT) nebulizer solution  0.5 mg Nebulization BID  . buPROPion  150 mg Oral BID  . chlorhexidine gluconate  15 mL Mouth  Rinse BID  . docusate sodium  100 mg Oral Daily  . enoxaparin (LOVENOX) injection  40 mg Subcutaneous Q24H  . feeding supplement (PRO-STAT SUGAR FREE 64)  30 mL Per Tube Daily  . ipratropium-albuterol  3 mL Nebulization Q6H  . levofloxacin (LEVAQUIN) IV  750 mg Intravenous Q24H  . multivitamin with  minerals  1 tablet Oral QHS  . pantoprazole sodium  40 mg Per Tube Daily  . potassium phosphate IVPB (mmol)  20 mmol Intravenous Once  . rosuvastatin  5 mg Oral QHS  . sodium chloride  10-40 mL Intracatheter Q12H  . vancomycin  750 mg Intravenous Q12H   Continuous Infusions: . sodium chloride 10 mL/hr at 03/02/15 1427  . feeding supplement (JEVITY 1.2 CAL) 1,000 mL (03/05/15 1000)  . fentaNYL infusion INTRAVENOUS 150 mcg/hr (03/05/15 1000)  . lactated ringers 10 mL/hr at 03/05/15 1000  . phenylephrine (NEO-SYNEPHRINE) Adult infusion 55 mcg/min (03/05/15 1000)   PRN Meds:.acetaminophen (TYLENOL) oral liquid 160 mg/5 mL, albuterol, fentaNYL (SUBLIMAZE) injection, midazolam, ondansetron, sodium chloride  Assessment/Plan:  Active Problems:   PAD (peripheral artery disease) (HCC)   Acute respiratory failure with hypoxia (HCC)   Encounter for central line placement   Hypertrophic obstructive cardiomyopathy (HCC)   Demand ischemia (HCC)   Hypertrophic obstructive cardiomyopathy  - Echo pending; preliminary look yesterday showed normal LV function with severe SAM and LVOT gradient of 6 m/s (final results pending). Pulmonary edema likely related to obstructive physiology.  - Avoiding beta agonist such as epinephrine, norepinephrine, dobutamine, dopamine; when phenylephrine as tolerated.  - Avoid diuresis if possible as this would exacerbate HOCM physiology by decreasing preload.   - Trying to avoid excessive PEEP which would increase intrathoracic pressure which would then decrease preload and potentially worsen obstructive physiology.  - Resume beta blocker later as BP and pulse allow  PAF  - Developed atrial fibrillation yesterday; now in sinus on amiodarone; she will not tolerate atrial fibrillation due to HOCM (loss of atrial kick will worsen preload and exacerbate obstructive physiology).  Elevated troponin  - Minimally elevated, flat, demand ischemia in the setting of hypertrophic  obstructive cardiomyopathy and transient hypotension  Peripheral vascular disease  - Status post femoropopliteal bypass-Dr. Edilia Boickson  Hypomagnesemia  - Resolved  Postoperative fever  - ? Pneumonia; antibiotics initiated. Per critical care medicine/Dr. Edilia Boickson  VDRF  - Hopefully as her pulmonary edema continues to improve, she will be able to be extubated which will also improve her overall physiology and hopefully help her return to baseline. CCM managing vent.  Olga MillersBrian Durinda Buzzelli 03/05/2015, 10:21 AM

## 2015-03-05 NOTE — Progress Notes (Signed)
Neosyn. Drip restarted

## 2015-03-06 ENCOUNTER — Encounter (HOSPITAL_COMMUNITY): Payer: Medicare Other

## 2015-03-06 ENCOUNTER — Inpatient Hospital Stay (HOSPITAL_COMMUNITY): Payer: Medicare Other

## 2015-03-06 DIAGNOSIS — I5033 Acute on chronic diastolic (congestive) heart failure: Secondary | ICD-10-CM

## 2015-03-06 LAB — BLOOD GAS, ARTERIAL
ACID-BASE EXCESS: 6.2 mmol/L — AB (ref 0.0–2.0)
Bicarbonate: 32.1 mEq/L — ABNORMAL HIGH (ref 20.0–24.0)
DRAWN BY: 345601
FIO2: 0.4
O2 Saturation: 93.8 %
PEEP/CPAP: 5 cmH2O
PRESSURE CONTROL: 15 cmH2O
Patient temperature: 98.6
RATE: 14 resp/min
TCO2: 34 mmol/L (ref 0–100)
pCO2 arterial: 64.3 mmHg (ref 35.0–45.0)
pH, Arterial: 7.318 — ABNORMAL LOW (ref 7.350–7.450)
pO2, Arterial: 72.2 mmHg — ABNORMAL LOW (ref 80.0–100.0)

## 2015-03-06 LAB — CBC
HEMATOCRIT: 37.9 % (ref 36.0–46.0)
Hemoglobin: 12 g/dL (ref 12.0–15.0)
MCH: 30.4 pg (ref 26.0–34.0)
MCHC: 31.7 g/dL (ref 30.0–36.0)
MCV: 95.9 fL (ref 78.0–100.0)
Platelets: 137 10*3/uL — ABNORMAL LOW (ref 150–400)
RBC: 3.95 MIL/uL (ref 3.87–5.11)
RDW: 14.5 % (ref 11.5–15.5)
WBC: 10.4 10*3/uL (ref 4.0–10.5)

## 2015-03-06 LAB — BASIC METABOLIC PANEL
Anion gap: 4 — ABNORMAL LOW (ref 5–15)
BUN: 22 mg/dL — AB (ref 6–20)
CALCIUM: 8.3 mg/dL — AB (ref 8.9–10.3)
CO2: 34 mmol/L — ABNORMAL HIGH (ref 22–32)
Chloride: 97 mmol/L — ABNORMAL LOW (ref 101–111)
Creatinine, Ser: 0.57 mg/dL (ref 0.44–1.00)
GFR calc Af Amer: >60 mL/min (ref >60)
GLUCOSE: 122 mg/dL — AB (ref 65–99)
POTASSIUM: 4.7 mmol/L (ref 3.5–5.1)
SODIUM: 135 mmol/L (ref 135–145)

## 2015-03-06 LAB — CULTURE, RESPIRATORY: SPECIAL REQUESTS: NORMAL

## 2015-03-06 LAB — PHOSPHORUS: Phosphorus: 2.9 mg/dL (ref 2.5–4.6)

## 2015-03-06 LAB — CULTURE, RESPIRATORY W GRAM STAIN

## 2015-03-06 MED ORDER — BUPROPION HCL 100 MG PO TABS
100.0000 mg | ORAL_TABLET | Freq: Two times a day (BID) | ORAL | Status: DC
  Administered 2015-03-06 – 2015-03-09 (×7): 100 mg via ORAL
  Filled 2015-03-06 (×10): qty 1

## 2015-03-06 MED ORDER — FUROSEMIDE 10 MG/ML IJ SOLN
20.0000 mg | Freq: Once | INTRAMUSCULAR | Status: AC
  Administered 2015-03-06: 20 mg via INTRAVENOUS
  Filled 2015-03-06: qty 2

## 2015-03-06 NOTE — Care Management Important Message (Signed)
Important Message  Patient Details  Name: Beverly Li MRN: [id number] Date of Birth: 12/29/1945   Medicare Important Message Given:  Yes    Kyla BalzarineShealy, Blanche Scovell Abena 03/06/2015, 10:15 AM

## 2015-03-06 NOTE — Progress Notes (Signed)
Patient Name: Beverly Li Date of Encounter: 03/06/2015  Active Problems:   PAD (peripheral artery disease) (HCC)   Acute respiratory failure with hypoxia (HCC)   Encounter for central line placement   Hypertrophic obstructive cardiomyopathy (HCC)   Demand ischemia (HCC)   Hypophosphatemia   Primary Cardiologist: Dr. Anne Fu Patient Profile: 69 yo female w/ PMH of hypertrophic cardiomyopathy, HTN, and PAD admitted for right fem-pop bypass on 03/02/2015 who developed hemodynamic instability while in the OR. Echo at that time showed a dynamic outflow gradient.   SUBJECTIVE: Intubated. Opens eyes to voice.  OBJECTIVE Filed Vitals:   03/06/15 0830 03/06/15 0900 03/06/15 0930 03/06/15 1000  BP: 149/89 144/87 99/76 82/56   Pulse: 95 94 86 81  Temp:      TempSrc:      Resp: Height:      Weight:      SpO2: 92% 91% 91% 93%    Intake/Output Summary (Last 24 hours) at 03/06/15 1053 Last data filed at 03/06/15 1000  Gross per 24 hour  Intake 2635.29 ml  Output   1040 ml  Net 1595.29 ml   Filed Weights   03/04/15 0615 03/05/15 0500 03/06/15 0600  Weight: 140 lb 6.9 oz (63.7 kg) 145 lb 15.1 oz (66.2 kg) 144 lb 6.4 oz (65.5 kg)    PHYSICAL EXAM General: Well developed, well nourished, female. Intubated but arousable. Head: Normocephalic, atraumatic.  Neck: Supple without bruits, JVD not elevated. Lungs:  Resp regular and unlabored, CTA without wheezing or rales anteriorly. Heart: RRR, S1, S2, no S3, S4, 2/6 SEM at LLSB; no rub. Abdomen: Soft, non-tender, non-distended with normoactive bowel sounds. No hepatomegaly. No rebound/guarding. No obvious abdominal masses. Extremities: No clubbing, cyanosis, or edema. Distal pedal pulses are 2+ bilaterally. Neuro: Alert and oriented X 3. Moves all extremities spontaneously.  LABS: CBC: Recent Labs  03/04/15 0355 03/05/15 0414 03/06/15 0351  WBC 11.3* 10.7* 10.4  NEUTROABS 8.7*  --   --   HGB 12.7 12.2 12.0    HCT 39.6 38.3 37.9  MCV 92.3 93.0 95.9  PLT 140* 159 137*   INR:No results for input(s): INR in the last 72 hours. Basic Metabolic Panel: Recent Labs  03/04/15 0355 03/05/15 0414 03/06/15 0351  NA 136 136 135  K 3.6 3.6 4.7  CL 104 104 97*  CO2 27 26 34*  GLUCOSE 114* 138* 122*  BUN 15 16 22*  CREATININE 0.60 0.59 0.57  CALCIUM 8.0* 7.2* 8.3*  MG 2.1 1.9  --   PHOS 1.4* 1.6*  --    TELE:   NSR with rate in 80's - 90's.      ECHO: 03/04/2015 Study Conclusions - Left ventricle: The cavity size was normal. Wall thickness was increased in a pattern of moderate LVH. Systolic function was vigorous. The estimated ejection fraction was in the range of 65% to 70%. Wall motion was normal; there were no regional wall motion abnormalities. Features are consistent with a pseudonormal left ventricular filling pattern, with concomitant abnormal relaxation and increased filling pressure (grade 2 diastolic dysfunction). - Aortic valve: Mildly to moderately calcified annulus. - Left atrium: The atrium was mildly dilated. - Pericardium, extracardiac: There was a left pleural effusion.  Radiology/Studies: Dg Chest Port 1 View: 03/06/2015  CLINICAL DATA:  69 year old female status post right-sided fem-pop bypass graft and right femoral endarterectomy. History of claudication. EXAM: PORTABLE CHEST 1 VIEW COMPARISON:  Chest x-ray 03/05/2015. FINDINGS: An endotracheal tube  is in place with tip 2.8 cm above the carina. There is a left-sided internal jugular central venous catheter with tip terminating in the distal superior vena cava. A nasogastric tube is seen extending into the stomach, however, the tip of the nasogastric tube extends below the lower margin of the image. Lung volumes are low. Extensive opacities throughout the mid to lower lungs bilaterally may reflect areas of atelectasis and/or airspace consolidation. Moderate bilateral pleural effusions. Heart size is mildly  enlarged. Cephalization of the pulmonary vasculature. The patient is rotated to the right on today's exam, resulting in distortion of the mediastinal contours and reduced diagnostic sensitivity and specificity for mediastinal pathology. Atherosclerosis in the thoracic aorta. IMPRESSION: 1. Support apparatus, as above. 2. The appearance the chest is very similar to the prior examination. Findings may simply reflect congestive heart failure, with extensive bibasilar atelectasis in the setting of moderate bilateral pleural effusions, however, underlying airspace consolidation throughout the mid to lower lungs from infectious etiologies or aspiration is not excluded. 3. Atherosclerosis. Electronically Signed   By: Trudie Reed M.D.   On: 03/06/2015 07:16   Dg Chest Port 1 View: 03/05/2015  CLINICAL DATA:  Respiratory failure. EXAM: PORTABLE CHEST 1 VIEW COMPARISON:  03/04/2015 FINDINGS: Endotracheal tube, enteric catheter, left internal jugular approach central venous catheter stable. Cardiomediastinal silhouette is enlarged. Mediastinal contours appear intact. There is no evidence of pneumothorax. Bilateral lower lobe predominance airspace opacities are not significantly changed. Osseous structures are without acute abnormality. Soft tissues are grossly normal. IMPRESSION: No significant change in the aeration of the lungs with bilateral lower lobe airspace opacities, which may represent bilateral pleural effusions with atelectasis or airspace consolidation. Stable supporting lines and tubes. Electronically Signed   By: Ted Mcalpine M.D.   On: 03/05/2015 09:05     Current Medications:  . antiseptic oral rinse  7 mL Mouth Rinse QID  . aspirin  324 mg Oral Daily  . B-complex with vitamin C  1 tablet Oral Daily  . budesonide (PULMICORT) nebulizer solution  0.5 mg Nebulization BID  . buPROPion  100 mg Oral BID  . chlorhexidine gluconate  15 mL Mouth Rinse BID  . docusate sodium  100 mg Oral Daily    . enoxaparin (LOVENOX) injection  40 mg Subcutaneous Q24H  . feeding supplement (PRO-STAT SUGAR FREE 64)  30 mL Per Tube Daily  . ipratropium-albuterol  3 mL Nebulization Q6H  . levofloxacin (LEVAQUIN) IV  750 mg Intravenous Q24H  . multivitamin with minerals  1 tablet Oral QHS  . pantoprazole sodium  40 mg Per Tube Daily  . rosuvastatin  5 mg Oral QHS  . sodium chloride  10-40 mL Intracatheter Q12H  . vancomycin  750 mg Intravenous Q12H   . sodium chloride 10 mL/hr at 03/02/15 1427  . feeding supplement (JEVITY 1.2 CAL) 1,000 mL (03/06/15 1000)  . fentaNYL infusion INTRAVENOUS 150 mcg/hr (03/06/15 1000)  . lactated ringers 10 mL/hr at 03/06/15 1000  . phenylephrine (NEO-SYNEPHRINE) Adult infusion Stopped (03/06/15 0827)    ASSESSMENT AND PLAN:  1. Hypertrophic obstructive cardiomyopathy - known history of hypertrophic cardiomyopathy. On 03/02/2015 she developed hemodynamic instability while in the OR. Echo at that time showed a dynamic outflow gradient. - Continue to avoid beta agonist such as epinephrine, norepinephrine, dobutamine, dopamine; Neo has been stopped and pressures are becoming more stable. - Avoid diuresis if possible as this would exacerbate HOCM physiology by decreasing preload.  - Trying to avoid excessive PEEP which would increase intrathoracic  pressure which would then decrease preload and potentially worsen obstructive physiology. - Resume beta blocker later as BP and pulse allow.  2. PAF - Developed atrial fibrillation on 03/04/2015. Converted to NSR on 03/05/2015 on amiodarone. According to records, Amiodarone was stopped on 03/05/2015 due to patient having junctional rhythm. MD to assess need to resume.    3. Elevated troponin - cyclic troponin values were 7.840.03, 0.04, and 0.06.  - Minimally elevated, flat, demand ischemia in the setting of hypertrophic obstructive cardiomyopathy and transient hypotension  4. Peripheral vascular disease - Status post  femoropopliteal bypass - per Vascular Surgery  5. Hypomagnesemia - Resolved  6. HCAP - on Vancomycin and Levaquin - per Critical Care  7. VDRF - Hopefully as her pulmonary edema continues to improve, she will be able to be extubated which will also improve her overall physiology and hopefully help her return to baseline. CCM managing vent.    Lorri FrederickSigned, Brittany M Strader , PA-C 10:53 AM 03/06/2015 Pager: [phone]  Patient seen, examined. Available data reviewed. Agree with findings, assessment, and plan as outlined by Randall AnBrittany Strader, PA-C. The patient is independently examined. She is on the ventilator, currently sedated. Lungs with diffuse rhonchi. Heart is regular rate and rhythm with a 3/6 midsystolic murmur heard throughout. No peripheral edema noted. Husband and son updated at the bedside. Chest x-ray shows diffuse edema and pleural effusions. Diuresis will have to be done cautiously, but I think it would be reasonable to try low-dose IV Lasix to help her respiratory status. Patient also is being treated for pneumonia per the critical care service. We will give Lasix 20 mg IV 1 and follow clinical progress.  Tonny BollmanMichael Damarrion Mimbs, M.D. 03/06/2015 12:54 PM

## 2015-03-06 NOTE — Progress Notes (Signed)
RT called MD in the BOX for critical  CO2 value. MD requested to leave patient settings the way they are. RT will continue to monitor.

## 2015-03-06 NOTE — Progress Notes (Signed)
ANTIBIOTIC CONSULT NOTE - follow up Pharmacy Consult for Levaquin and Vancomycin  Indication: pneumonia  Allergies  Allergen Reactions  . Amoxicillin Other (See Comments)    UTI  . Atenolol Cough  . Sulfa Antibiotics Nausea Only  . Codeine Rash    Patient Measurements: Height: 5\' 5"  (165.1 cm) Weight: 144 lb 6.4 oz (65.5 kg) IBW/kg (Calculated) : 57   Vital Signs: Temp: 99.4 F (37.4 C) (11/21 0730) Temp Source: Axillary (11/21 0730) BP: 104/72 mmHg (11/21 1100) Pulse Rate: 87 (11/21 1100) Intake/Output from previous day: 11/20 0701 - 11/21 0700 In: 3293.4 [I.V.:713.4; NG/GT:1630; IV Piggyback:950] Out: 1285 [Urine:1285] Intake/Output from this shift: Total I/O In: 481.9 [I.V.:91.9; NG/GT:240; IV Piggyback:150] Out: 220 [Urine:220]  Labs:  Recent Labs  03/04/15 0355 03/05/15 0414 03/06/15 0351  WBC 11.3* 10.7* 10.4  HGB 12.7 12.2 12.0  PLT 140* 159 137*  CREATININE 0.60 0.59 0.57      Assessment: [name] admitted for elective Right common femoral artery endarterectomy and right common femoral artery to below-knee popliteal artery bypass with composite vein graft on 11/17 that developed post op respiratory failure. vanc/lvq day # 4 for HCAP T max 101.1  WBC 15.5>13.3>11.3>10.7>10.4, CXR 11/21:  may reflect CHF, but underlying consolidation throughout mid to lower lobes from infectious or aspiration is not excluded  Microbiology: Cx data: 11/18 sputum: pending 11/18 blood x2: ngtd  Abx:   Levaquin: 11/18 >> Vancomycin 11/18 >>   Zinacef 11/17>>11/18  Goal of Therapy:  Vancomycin trough level 15-20 mcg/ml  Treatment of infection  Plan:  - continues on Vancomycin 750 mg Q12H, consider d/c soon  - continues on Levaquin 750 mg IV Q24H - PCN allergy noted; but has tolerated cefuroxime as surgical prophylaxis so could use ceftazidime if desired or fails to respond to current abx regimen - will check vancomycin trough in am prior to 08 am dose  Herby AbrahamMichelle  T. Tonea Leiphart, Pharm.D. [phone] 03/06/2015 11:44 AM

## 2015-03-06 NOTE — Progress Notes (Signed)
[facility]  Subjective  - arousable follows some commands   Objective 82/56 81 99.4 F (37.4 C) (Axillary) 13 93%  Intake/Output Summary (Last 24 hours) at 03/06/15 1101 Last data filed at 03/06/15 1000  Gross per 24 hour  Intake 2584.69 ml  Output   1015 ml  Net 1569.69 ml   Right thigh some serous drainage otherwise incisions intact +/- pulse DP right foot, has doppler signals brisk cap refill  Assessment/Planning: Husband updated Critical care assistance with overall management appreciated Hopefully off vent soon but also discussed trach with pt husband Antibiotics per critical care Diuresis/cardiac management per critical care and cardiology  Fabienne BrunsFields, Charles 03/06/2015 11:01 AM --  Laboratory Lab Results:  Recent Labs  03/05/15 0414 03/06/15 0351  WBC 10.7* 10.4  HGB 12.2 12.0  HCT 38.3 37.9  PLT 159 137*   BMET  Recent Labs  03/05/15 0414 03/06/15 0351  NA 136 135  K 3.6 4.7  CL 104 97*  CO2 26 34*  GLUCOSE 138* 122*  BUN 16 22*  CREATININE 0.59 0.57  CALCIUM 7.2* 8.3*    COAG Lab Results  Component Value Date   INR 1.01 02/27/2015   No results found for: PTT

## 2015-03-06 NOTE — Progress Notes (Signed)
CRITICAL VALUE ALERT  Critical value received: pCO2 = 64.3  Date of notification:  t  Time of notification:  0410  Critical value read back:Yes.    Nurse who received alert:  Aura CampsJ. Mayia Megill,RN  MD notified (1st page):  [facility]  Time of first page:  0410  MD notified (2nd page):  Time of second page:  Responding MD:  Dr Craige CottaSood  Time MD responded:  [phone]

## 2015-03-06 NOTE — Progress Notes (Signed)
PULMONARY / CRITICAL CARE MEDICINE   Name: Beverly Li MRN: [id number] DOB: [birth year]    ADMISSION DATE:  03/02/2015 CONSULTATION DATE:  03/02/2015  REFERRING MD :  Dr Edilia Bo [facility]  CHIEF COMPLAINT:  Acute post op resp failure  BRIEF: 69 year old female with baseline claudication but no dyspnea but has some HOCM (followed by Dr Anne Fu) but at baseline and has had a normal cardiac stress test and is able to climb stairs without dyspnea. He underwent on 03/02/2015 right below knee popliteal artery femoropopliteal bypass and femoral artery endarterectomy on the right. Overall operative time was approximately 4 hours according to anesthesia. First 2 hours no problem but subsequently started having severe evidence of hypertrophic obstructive cardiomyopathy as evidenced by intraoperative echo and periods of hypotension. Status post 4-5 liters of fluid and for albumin infusions. Brought into the intensive care unit postoperatively and was spontaneously weaned off Neo-Synephrine but was found to be in respiratory acidosis with severe hypoxemia and chest x-ray with acute pulmonary edema bilaterally. Pulmonary critical care called for consultation 03/02/15  EVENTS: 11/17 - Admit to hospital 11/17 - Right below knee Fem-Pop bypass w/ femoral artery endarterectomy on right  STUDIES: Port CXR 11/21 - Bilateral LL opacification R>L. ETT & CVL in good position.  MICROBIOLOGY: Blood Ctx 11/18>>> Trach Asp 11/18>>>  ANTIBIOTICS: Vancomycin 11/18>>> Levaquin 11/18>>>  SUBJECTIVE:  No acute events overnight. Patient seems to be tolerating tube feeds.  REVIEW OF SYSTEMS:  Unable to obtain as patient is intubated & sedated.  VITAL SIGNS: Temp:  [98.2 F (36.8 C)-101.1 F (38.4 C)] 99.4 F (37.4 C) (11/21 0730) Pulse Rate:  [65-99] 95 (11/21 0830) Resp:  [7-24] 16 (11/21 0830) BP: (68-175)/(45-99) 149/89 mmHg (11/21 0830) SpO2:  [88 %-100 %] 92 % (11/21 0830) Arterial Line BP:  (59-112)/(51-103) 59/51 mmHg (11/20 1100) FiO2 (%):  [40 %] 40 % (11/21 0830) Weight:  [144 lb 6.4 oz (65.5 kg)] 144 lb 6.4 oz (65.5 kg) (11/21 0600) HEMODYNAMICS:   VENTILATOR SETTINGS: Vent Mode:  [-] PCV FiO2 (%):  [40 %] 40 % Set Rate:  [14 bmp] 14 bmp PEEP:  [5 cmH20] 5 cmH20 Plateau Pressure:  [16 cmH20-19 cmH20] 19 cmH20 INTAKE / OUTPUT:  Intake/Output Summary (Last 24 hours) at 03/06/15 0916 Last data filed at 03/06/15 0800  Gross per 24 hour  Intake 2605.94 ml  Output   1045 ml  Net 1560.94 ml    PHYSICAL EXAMINATION: General:  No acute distress. Family at bedside.  Integument:  Warm & dry. No rash on exposed skin.  HEENT:  No scleral injection or icterus. Endotracheal tube in place.  Cardiovascular:  Regular rate. Sinus rhythm on tele. No edema.  Pulmonary: Coarse breath sounds bilaterally with diffuse wheezing. Symmetric chest wall rise on ventilator. Abdomen: Soft. Normal bowel sounds. Nondistended.  Neurological:  Spontaneously moving all 4 extremities. Opens eyes to voice. Doesn't follow commands. No withdrawal to pain in all 4 extremities.   PULMONARY  Recent Labs Lab 03/02/15 2011 03/04/15 0338 03/05/15 0349 03/05/15 1120 03/06/15 0342  PHART 7.381 7.482* 7.417 7.341* 7.318*  PCO2ART 40.3 35.8 46.3* 56.5* 64.3*  PO2ART 130.0* 61.0* 63.0* 68.0* 72.2*  HCO3 24.0 26.9* 29.7* 30.5* 32.1*  TCO2 32 34.0  O2SAT 99.0 93.0 91.0 92.0 93.8    CBC  Recent Labs Lab 03/04/15 0355 03/05/15 0414 03/06/15 0351  HGB 12.7 12.2 12.0  HCT 39.6 38.3 37.9  WBC 11.3* 10.7* 10.4  PLT 140* 159 137*  COAGULATION  Recent Labs Lab 02/27/15 1128  INR 1.01    CARDIAC    Recent Labs Lab 03/02/15 1055 03/02/15 1730 03/02/15 2304  TROPONINI 0.03 0.04* 0.06*   No results for input(s): PROBNP in the last 168 hours.   CHEMISTRY  Recent Labs Lab 03/02/15 2304 03/03/15 0420 03/03/15 0850 03/04/15 0355 03/05/15 0414 03/06/15 0351  NA 139   --  137 136 136 135  K 3.9  --  3.3* 3.6 3.6 4.7  CL 106  --  103 104 104 97*  CO2 25  --  34*  GLUCOSE 104*  --  95 114* 138* 122*  BUN 10  --  22*  CREATININE 0.63  --  0.69 0.60 0.59 0.57  CALCIUM 8.3*  --  8.1* 8.0* 7.2* 8.3*  MG  --  1.2*  --  2.1 1.9  --   PHOS  --  2.8  --  1.4* 1.6*  --    Estimated Creatinine Clearance: 59.7 mL/min (by C-G formula based on Cr of 0.57).   LIVER  Recent Labs Lab 02/27/15 1128  AST 23  ALT 18  ALKPHOS 115  BILITOT 0.4  PROT 7.0  ALBUMIN 3.8  INR 1.01     INFECTIOUS  Recent Labs Lab 03/03/15 0850 03/04/15 0355 03/05/15 0414  LATICACIDVEN 1.3  --   --   PROCALCITON <0.10 0.28 0.24     ENDOCRINE CBG (last 3)   Recent Labs  03/04/15 1128 03/04/15 1553 03/05/15 0351  GLUCAP 157* 156* 120*     IMAGING x48h Dg Chest Port 1 View  03/06/2015  CLINICAL DATA:  69 year old female status post right-sided fem-pop bypass graft and right femoral endarterectomy. History of claudication. EXAM: PORTABLE CHEST 1 VIEW COMPARISON:  Chest x-ray 03/05/2015. FINDINGS: An endotracheal tube is in place with tip 2.8 cm above the carina. There is a left-sided internal jugular central venous catheter with tip terminating in the distal superior vena cava. A nasogastric tube is seen extending into the stomach, however, the tip of the nasogastric tube extends below the lower margin of the image. Lung volumes are low. Extensive opacities throughout the mid to lower lungs bilaterally may reflect areas of atelectasis and/or airspace consolidation. Moderate bilateral pleural effusions. Heart size is mildly enlarged. Cephalization of the pulmonary vasculature. The patient is rotated to the right on today's exam, resulting in distortion of the mediastinal contours and reduced diagnostic sensitivity and specificity for mediastinal pathology. Atherosclerosis in the thoracic aorta. IMPRESSION: 1. Support apparatus, as above. 2. The appearance  the chest is very similar to the prior examination. Findings may simply reflect congestive heart failure, with extensive bibasilar atelectasis in the setting of moderate bilateral pleural effusions, however, underlying airspace consolidation throughout the mid to lower lungs from infectious etiologies or aspiration is not excluded. 3. Atherosclerosis. Electronically Signed   By: Trudie Reed M.D.   On: 03/06/2015 07:16   Dg Chest Port 1 View  03/05/2015  CLINICAL DATA:  Respiratory failure. EXAM: PORTABLE CHEST 1 VIEW COMPARISON:  03/04/2015 FINDINGS: Endotracheal tube, enteric catheter, left internal jugular approach central venous catheter stable. Cardiomediastinal silhouette is enlarged. Mediastinal contours appear intact. There is no evidence of pneumothorax. Bilateral lower lobe predominance airspace opacities are not significantly changed. Osseous structures are without acute abnormality. Soft tissues are grossly normal. IMPRESSION: No significant change in the aeration of the lungs with bilateral lower lobe airspace opacities, which may represent bilateral pleural effusions with atelectasis  or airspace consolidation. Stable supporting lines and tubes. Electronically Signed   By: Ted Mcalpineobrinka  Dimitrova M.D.   On: 03/05/2015 09:05     ASSESSMENT / PLAN:  PULMONARY ETT 11/17 >> A:  Acute Hypoxic Respiratory Failure -  Secondary to acute pulmonary edema. Wheezing with hx of smoking - Possible COPD with exacerbation. Chronic Hypercarbic Respiratory Failure  P:   Changed to pressure control 11/20 Duoneb q6hr Pulmicort 0.5mg  neb bid Continue daily SBT as mental status allows May attempt diuresis tomorrow depending on BP & respiratory status  CARDIOVASCULAR Lt IJ CVL 11/17 >> A:  Hypertrophic cardiomyopathy with cardiogenic shock - Shock resolved. A fib with RVR 11/20 - Now in sinus rhythm. S/p Rt femo-pop bypass. H/O HTN, HLD.  P:  Use phenylephrine to maintain MAP > 65 Avoid  tachycardia Post-op care per VVS ASA 325mg  via tube daily Crestor 5mg  via tube qhs Hold outpt norvasc, cozaar, bystolic  RENAL A:   Hypophosphatemia  Hypomagnesemia - Resolved. Hypokalemia - Resolved.   P:   Repeat Phosphorus level now Trending renal function with daily BUN/Creatinine Monitoring UOP with Foley  GASTROINTESTINAL A:  Nutrition.  P:   Continuing tube feeds Protonix via tube daily  HEMATOLOGIC A:   Leukocytosis - Resolved.  P:  Monitoring Hgb daily w/ CBC Lovenox for DVT prophylaxis SCDs for DVT prophylaxis  INFECTIOUS A:  HCAP H/O PCN allergy.  P:   Day#4 Vancomycin & Levaquin  ENDOCRINE A:   No acute issues.   P:   Monitor glucose on BMP daily.  NEUROLOGIC A:  H/O Insomnia H/O Anxiety  P:   RASS goal: 0  Switching Wellbutrin off SR tabs to 100mg  via tube bid Hold outpt ultram & ambien for now Fentanyl gtt Versed IV prn  FAMILY:  Husband & son updated by Dr. Jamison NeighborNestor at bedside this morning.   TODAY'S SUMMARY:  Unfortunate 69 year old female status post femoropopliteal bypass. Patient has ongoing respiratory failure complicated by healthcare associated pneumonia. Improvement in leukocytosis & fever encouraging. Briefly discussed potential need for tracheostomy with the family depending upon sputum progress. Checking serum phosphorus today for possible replacement needed.  I have spent a total of 33 minutes of critical care time today caring for the patient, updating the patient's family at bedside, & reviewing the patient's electronic medical record.  Donna ChristenJennings E. Jamison NeighborNestor, M.D. [facility] Pager:  [phone] After 3pm or if no response, call [phone]  03/06/2015, 9:16 AM

## 2015-03-07 DIAGNOSIS — J441 Chronic obstructive pulmonary disease with (acute) exacerbation: Secondary | ICD-10-CM

## 2015-03-07 DIAGNOSIS — J9612 Chronic respiratory failure with hypercapnia: Secondary | ICD-10-CM

## 2015-03-07 LAB — RENAL FUNCTION PANEL
Albumin: 2.6 g/dL — ABNORMAL LOW (ref 3.5–5.0)
Anion gap: 7 (ref 5–15)
BUN: 26 mg/dL — AB (ref 6–20)
CALCIUM: 9 mg/dL (ref 8.9–10.3)
CHLORIDE: 96 mmol/L — AB (ref 101–111)
CO2: 37 mmol/L — AB (ref 22–32)
CREATININE: 0.57 mg/dL (ref 0.44–1.00)
GFR calc Af Amer: >60 mL/min (ref >60)
GFR calc non Af Amer: >60 mL/min (ref >60)
GLUCOSE: 158 mg/dL — AB (ref 65–99)
Phosphorus: 3.3 mg/dL (ref 2.5–4.6)
Potassium: 4.8 mmol/L (ref 3.5–5.1)
SODIUM: 140 mmol/L (ref 135–145)

## 2015-03-07 LAB — MAGNESIUM: MAGNESIUM: 2.2 mg/dL (ref 1.7–2.4)

## 2015-03-07 LAB — CBC WITH DIFFERENTIAL/PLATELET
BASOS PCT: 0 %
Basophils Absolute: 0 10*3/uL (ref 0.0–0.1)
EOS ABS: 0 10*3/uL (ref 0.0–0.7)
EOS PCT: 0 %
HCT: 39.8 % (ref 36.0–46.0)
Hemoglobin: 12.2 g/dL (ref 12.0–15.0)
LYMPHS ABS: 1.2 10*3/uL (ref 0.7–4.0)
Lymphocytes Relative: 10 %
MCH: 30 pg (ref 26.0–34.0)
MCHC: 30.7 g/dL (ref 30.0–36.0)
MCV: 98 fL (ref 78.0–100.0)
MONOS PCT: 10 %
Monocytes Absolute: 1.2 10*3/uL — ABNORMAL HIGH (ref 0.1–1.0)
NEUTROS PCT: 80 %
Neutro Abs: 9.7 10*3/uL — ABNORMAL HIGH (ref 1.7–7.7)
PLATELETS: 171 10*3/uL (ref 150–400)
RBC: 4.06 MIL/uL (ref 3.87–5.11)
RDW: 14.5 % (ref 11.5–15.5)
WBC: 12.1 10*3/uL — ABNORMAL HIGH (ref 4.0–10.5)

## 2015-03-07 LAB — VANCOMYCIN, TROUGH: Vancomycin Tr: 6 ug/mL — ABNORMAL LOW (ref 10.0–20.0)

## 2015-03-07 MED ORDER — PRO-STAT SUGAR FREE PO LIQD
30.0000 mL | Freq: Two times a day (BID) | ORAL | Status: DC
  Administered 2015-03-07 – 2015-03-09 (×4): 30 mL
  Filled 2015-03-07 (×7): qty 30

## 2015-03-07 MED ORDER — DEXMEDETOMIDINE HCL IN NACL 200 MCG/50ML IV SOLN
0.4000 ug/kg/h | INTRAVENOUS | Status: DC
  Administered 2015-03-07 (×2): 0.9 ug/kg/h via INTRAVENOUS
  Administered 2015-03-07: 0.2 ug/kg/h via INTRAVENOUS
  Administered 2015-03-07: 1.2 ug/kg/h via INTRAVENOUS
  Administered 2015-03-07: 0.4 ug/kg/h via INTRAVENOUS
  Administered 2015-03-07: 1.4 ug/kg/h via INTRAVENOUS
  Administered 2015-03-08: 1.5 ug/kg/h via INTRAVENOUS
  Administered 2015-03-08: 1.6 ug/kg/h via INTRAVENOUS
  Filled 2015-03-07 (×8): qty 50

## 2015-03-07 MED ORDER — MIDAZOLAM HCL 2 MG/2ML IJ SOLN
0.5000 mg | INTRAMUSCULAR | Status: DC | PRN
  Administered 2015-03-07: 0.5 mg via INTRAVENOUS
  Filled 2015-03-07: qty 2

## 2015-03-07 MED ORDER — VANCOMYCIN HCL 10 G IV SOLR
1500.0000 mg | Freq: Two times a day (BID) | INTRAVENOUS | Status: AC
  Administered 2015-03-08 – 2015-03-09 (×4): 1500 mg via INTRAVENOUS
  Filled 2015-03-07 (×4): qty 1500

## 2015-03-07 MED ORDER — SODIUM CHLORIDE 0.9 % IV BOLUS (SEPSIS)
250.0000 mL | Freq: Once | INTRAVENOUS | Status: AC
  Administered 2015-03-07: 250 mL via INTRAVENOUS

## 2015-03-07 MED ORDER — JEVITY 1.2 CAL PO LIQD
1000.0000 mL | ORAL | Status: DC
  Administered 2015-03-07 – 2015-03-09 (×3): 1000 mL
  Filled 2015-03-07 (×5): qty 1000

## 2015-03-07 MED ORDER — FENTANYL CITRATE (PF) 100 MCG/2ML IJ SOLN
25.0000 ug | INTRAMUSCULAR | Status: DC | PRN
  Administered 2015-03-07 – 2015-03-08 (×15): 50 ug via INTRAVENOUS
  Filled 2015-03-07 (×15): qty 2

## 2015-03-07 NOTE — Progress Notes (Signed)
Nutrition Follow-up  DOCUMENTATION CODES:   Not applicable  INTERVENTION:  Decrease Jevity 1.2 formula to new goal rate of 50 ml/hr with Prostat liquid protein 30 ml BID via tube.  TF regimen provides 1640 kcals, 97 gm protein, 972 ml of free water.  RD to continue to monitor.   NUTRITION DIAGNOSIS:   Inadequate oral intake related to inability to eat as evidenced by NPO status; ongoing  GOAL:   Patient will meet greater than or equal to 90% of their needs; met  MONITOR:   TF tolerance, Vent status, Labs, Weight trends, I & O's  REASON FOR ASSESSMENT:   Consult Enteral/tube feeding initiation and management  ASSESSMENT:   69 yo Female with PMH of HTN, atrial fib, atrial arrhythmia; presented with bilateral lower extremity claudication.  Patient s/p procedures 11/17: RIGHT COMMON FEMORAL ARTERY ENDARTERECTOMY RIGHT COMMON FEMORAL ARTERY TO BELOW-KNEE POPLITEAL ARTERY BYASS  Patient is currently intubated on ventilator support MV: 11.4 L/min Temp (24hrs), Avg:99.3 F (37.4 C), Min:97 F (36.1 C), Max:101.8 F (38.8 C)  Tube feeding has been infusing at goal rate with no other difficulties. TF orders have been modified to new estimated nutritional needs. Weaning trials on hold for now as pt with increased agitation.  Pt with no observed significant fat or muscle mass loss.   Labs and medications reviewed.   Diet Order:  Diet NPO time specified  Skin:   (Incision on R leg, groin, skin tear R thigh)  Last BM:  N/A  Height:   Ht Readings from Last 1 Encounters:  03/03/15 5' 5"  (1.651 m)    Weight:   Wt Readings from Last 1 Encounters:  03/07/15 146 lb 2.6 oz (66.3 kg)    Ideal Body Weight:  56.8 kg  BMI:  Body mass index is 24.32 kg/(m^2).  Estimated Nutritional Needs:   Kcal:  1506  Protein:  90-100 gm  Fluid:  per MD  EDUCATION NEEDS:   No education needs identified at this time  Corrin Parker, MS, RD, LDN Pager # [phone] After  hours/ weekend pager # [phone]

## 2015-03-07 NOTE — Plan of Care (Signed)
Problem: Phase I Progression Outcomes Goal: Graft patent without s/s of occlusion Outcome: Progressing Palpable DP and PT Goal: Tolerates diet without nausea/vomiting Outcome: Not Progressing Pt remains intubated for respiratory support.

## 2015-03-07 NOTE — Progress Notes (Addendum)
PULMONARY / CRITICAL CARE MEDICINE   Name: Beverly Li MRN: [id number] DOB: 1946/02/20    ADMISSION DATE:  03/02/2015 CONSULTATION DATE:  03/02/2015  REFERRING MD :  Dr Edilia Bo [facility]  CHIEF COMPLAINT:  Acute post op resp failure  BRIEF: 69 year old female with baseline claudication but no dyspnea but has some HOCM (followed by Dr Anne Fu) but at baseline and has had a normal cardiac stress test and is able to climb stairs without dyspnea. He underwent on 03/02/2015 right below knee popliteal artery femoropopliteal bypass and femoral artery endarterectomy on the right. Overall operative time was approximately 4 hours according to anesthesia. First 2 hours no problem but subsequently started having severe evidence of hypertrophic obstructive cardiomyopathy as evidenced by intraoperative echo and periods of hypotension. Status post 4-5 liters of fluid and for albumin infusions. Brought into the intensive care unit postoperatively and was spontaneously weaned off Neo-Synephrine but was found to be in respiratory acidosis with severe hypoxemia and chest x-ray with acute pulmonary edema bilaterally. Pulmonary critical care called for consultation 03/02/15  EVENTS: 11/17 - Admit to hospital 11/17 - Right below knee Fem-Pop bypass w/ femoral artery endarterectomy on right  STUDIES: Port CXR 11/21 - Bilateral LL opacification R>L. ETT & CVL in good position. TTE 11/19 - EF 65-70% w/ moderate LVH. Grade 2 diastolic dysfunction. Left pleural effusion.  MICROBIOLOGY: Blood Ctx 11/18>>> Trach Asp 11/18 - Oral flora.  ANTIBIOTICS: Vancomycin 11/18>>> Levaquin 11/18>>>  SUBJECTIVE:  Patient's blood pressure labile overnight. Increased sedation needs caused hypotension requiring vasopressor support. Lasix IV x1 given yesterday.  REVIEW OF SYSTEMS:  Unable to obtain as patient is intubated & sedated.  VITAL SIGNS: Temp:  [97.3 F (36.3 C)-101.8 F (38.8 C)] 99.3 F (37.4 C) (11/22 0348) Pulse  Rate:  [74-105] 102 (11/22 0645) Resp:  [10-27] 17 (11/22 0645) BP: (76-196)/(54-162) 154/84 mmHg (11/22 0645) SpO2:  [89 %-100 %] 94 % (11/22 0645) FiO2 (%):  [40 %] 40 % (11/22 0400) Weight:  [146 lb 2.6 oz (66.3 kg)] 146 lb 2.6 oz (66.3 kg) (11/22 0428) HEMODYNAMICS:   VENTILATOR SETTINGS: Vent Mode:  [-] PCV FiO2 (%):  [40 %] 40 % Set Rate:  [14 bmp] 14 bmp PEEP:  [5 cmH20] 5 cmH20 Plateau Pressure:  [17 cmH20-19 cmH20] 19 cmH20 INTAKE / OUTPUT:  Intake/Output Summary (Last 24 hours) at 03/07/15 0753 Last data filed at 03/07/15 0600  Gross per 24 hour  Intake 2387.82 ml  Output   1605 ml  Net 782.82 ml    PHYSICAL EXAMINATION: General:  No acute distress. Eyes open.  Integument:  Warm & dry. No rash on exposed skin.  HEENT:  No scleral injection or icterus. Endotracheal tube in place.  Cardiovascular:  Regular rate. Sinus rhythm on telemetry. No edema.  Pulmonary: Minimal improvement in coarse wheeze bilaterally. Symmetric chest wall rise on ventilator. Abdomen: Soft. Normal bowel sounds. Nondistended.  Neurological:  Spontaneously moving all 4 extremities. Doesn't follow commands. No withdrawal to pain in extremities on sedation.   PULMONARY  Recent Labs Lab 03/02/15 2011 03/04/15 0338 03/05/15 0349 03/05/15 1120 03/06/15 0342  PHART 7.381 7.482* 7.417 7.341* 7.318*  PCO2ART 40.3 35.8 46.3* 56.5* 64.3*  PO2ART 130.0* 61.0* 63.0* 68.0* 72.2*  HCO3 24.0 26.9* 29.7* 30.5* 32.1*  TCO2 32 34.0  O2SAT 99.0 93.0 91.0 92.0 93.8    CBC  Recent Labs Lab 03/04/15 0355 03/05/15 0414 03/06/15 0351  HGB 12.7 12.2 12.0  HCT 39.6 38.3  37.9  WBC 11.3* 10.7* 10.4  PLT 140* 159 137*    COAGULATION No results for input(s): INR in the last 168 hours.  CARDIAC    Recent Labs Lab 03/02/15 1055 03/02/15 1730 03/02/15 2304  TROPONINI 0.03 0.04* 0.06*   No results for input(s): PROBNP in the last 168 hours.   CHEMISTRY  Recent Labs Lab  03/03/15 0420 03/03/15 0850 03/04/15 0355 03/05/15 0414 03/06/15 0351 03/06/15 1105 03/07/15 0338  NA  --  137 136 136 135  --  140  K  --  3.3* 3.6 3.6 4.7  --  4.8  CL  --  103 104 104 97*  --  96*  CO2  --  27 27 26  34*  --  37*  GLUCOSE  --  95 114* 138* 122*  --  158*  BUN  --  10 15 16  22*  --  26*  CREATININE  --  0.69 0.60 0.59 0.57  --  0.57  CALCIUM  --  8.1* 8.0* 7.2* 8.3*  --  9.0  MG 1.2*  --  2.1 1.9  --   --  2.2  PHOS 2.8  --  1.4* 1.6*  --  2.9 3.3   Estimated Creatinine Clearance: 59.7 mL/min (by C-G formula based on Cr of 0.57).   LIVER  Recent Labs Lab 03/07/15 0338  ALBUMIN 2.6*     INFECTIOUS  Recent Labs Lab 03/03/15 0850 03/04/15 0355 03/05/15 0414  LATICACIDVEN 1.3  --   --   PROCALCITON <0.10 0.28 0.24     ENDOCRINE CBG (last 3)   Recent Labs  03/04/15 1128 03/04/15 1553 03/05/15 0351  GLUCAP 157* 156* 120*     IMAGING x48h Dg Chest Port 1 View  03/06/2015  CLINICAL DATA:  69 year old female status post right-sided fem-pop bypass graft and right femoral endarterectomy. History of claudication. EXAM: PORTABLE CHEST 1 VIEW COMPARISON:  Chest x-ray 03/05/2015. FINDINGS: An endotracheal tube is in place with tip 2.8 cm above the carina. There is a left-sided internal jugular central venous catheter with tip terminating in the distal superior vena cava. A nasogastric tube is seen extending into the stomach, however, the tip of the nasogastric tube extends below the lower margin of the image. Lung volumes are low. Extensive opacities throughout the mid to lower lungs bilaterally may reflect areas of atelectasis and/or airspace consolidation. Moderate bilateral pleural effusions. Heart size is mildly enlarged. Cephalization of the pulmonary vasculature. The patient is rotated to the right on today's exam, resulting in distortion of the mediastinal contours and reduced diagnostic sensitivity and specificity for mediastinal pathology.  Atherosclerosis in the thoracic aorta. IMPRESSION: 1. Support apparatus, as above. 2. The appearance the chest is very similar to the prior examination. Findings may simply reflect congestive heart failure, with extensive bibasilar atelectasis in the setting of moderate bilateral pleural effusions, however, underlying airspace consolidation throughout the mid to lower lungs from infectious etiologies or aspiration is not excluded. 3. Atherosclerosis. Electronically Signed   By: Trudie Reedaniel  Entrikin M.D.   On: 03/06/2015 07:16     ASSESSMENT / PLAN:  PULMONARY ETT 11/17 >> A:  Acute Hypoxic Respiratory Failure -  Secondary to acute pulmonary edema. Wheezing with hx of smoking - Possible COPD with exacerbation. Chronic Hypercarbic Respiratory Failure  P:   Changed to pressure control 11/20 Duoneb q6hr Pulmicort 0.5mg  neb bid Continue daily SBT as mental status allows on PS 0/0  CARDIOVASCULAR Lt IJ CVL 11/17 >> A:  Hypertrophic  cardiomyopathy with cardiogenic shock - Shock resolved. A fib with RVR 11/20 - Now in sinus rhythm. S/p Rt femo-pop bypass. H/O HTN, HLD.  P:  Use phenylephrine to maintain MAP > 65 Avoid tachycardia Post-op care per VVS ASA  via tube daily Crestor  via tube qhs Hold outpt norvasc, cozaar, bystolic  RENAL A:   Hypophosphatemia - Resolved. Hypomagnesemia - Resolved. Hypokalemia - Resolved.   P:   Trending renal function with daily BUN/Creatinine Monitoring UOP with Foley Monitoring electrolytes daily  GASTROINTESTINAL A:  Nutrition.  P:   Continuing tube feeds Protonix via tube daily  HEMATOLOGIC A:   Leukocytosis - Resolved.  P:  Monitoring Hgb daily w/ CBC Lovenox for DVT prophylaxis SCDs for DVT prophylaxis  INFECTIOUS A:  HCAP H/O PCN allergy.  P:   Day#5 Vancomycin & Levaquin Plan to reculture for any fever today   ENDOCRINE A:   No acute issues.   P:   Monitor glucose on BMP daily.  NEUROLOGIC A:  H/O  Insomnia H/O Anxiety  P:   RASS goal: 0  Wellbutrin  via tube bid Hold outpt ultram & ambien for now Starting Precedex gtt to spare Fentanyl & Versed Weaning Fentanyl gtt Versed IV prn  FAMILY:  No family at bedside to update this morning.  TODAY'S SUMMARY:  Unfortunate 69 year old female status post femoropopliteal bypass. Sedation requirements are affecting the patient's blood pressure. Switching to Precedex gtt to spare Fentanyl & Versed. Plan to attempt SBT this morning on PS 0/0 once Precedex is started. Mental status is main barrier to extubation today.  I have spent a total of 31 minutes of critical care time today caring for the patient & reviewing the patient's electronic medical record.  Donna Christen Jamison Neighbor, M.D. [facility] Pulmonary & Critical Care Pager:  [phone] After 3pm or if no response, call [phone]  03/07/2015, 7:53 AM

## 2015-03-07 NOTE — Progress Notes (Signed)
RN at bedside, sedation has been changed per MD to help with weaning trials, pt has increased agitation at this time, will hold wean due to inc agitation and re-eval pt later today.  RT will monitor

## 2015-03-07 NOTE — Progress Notes (Signed)
Dr. Jamison NeighborNestor at bedside,plan is to switch sedation this am, will continue with full support for now and re-eval for wean when new sedation on board per MD, RT will continue to monitor

## 2015-03-07 NOTE — Progress Notes (Signed)
Pharmacy Antibiotic Time-Out Note  Beverly MeekerFrances B Li is a 69 y.o. year-old female admitted on 03/02/2015.  The patient is currently on vancomycin and levofloxacin for suspected HCAP.  Assessment/Plan: The dose of 750 mg q12h will be adjusted to 1500 mg q12h based on subtherapeutic levels.  Temp (24hrs), Avg:99.4 F (37.4 C), Min:97 F (36.1 C), Max:101.8 F (38.8 C)   Recent Labs Lab 03/03/15 0850 03/04/15 0355 03/05/15 0414 03/06/15 0351 03/07/15 0338  WBC 13.3* 11.3* 10.7* 10.4 12.1*    Recent Labs Lab 03/03/15 0850 03/04/15 0355 03/05/15 0414 03/06/15 0351 03/07/15 0338  CREATININE 0.69 0.60 0.59 0.57 0.57   Estimated Creatinine Clearance: 59.7 mL/min (by C-G formula based on Cr of 0.57).   Antimicrobial allergies: Amoxicillin  Antimicrobials this admission: Levaquin: 11/18 >> Vancomycin 11/18 >>   Zinacef 11/17>>11/18  Levels/dose changes this admission: 11/22 PM = 6 mcg/mL on 750 mg q12h  Microbiology Results: 11/18 trach aspirate -normal orapharyngeal flora 11/18 blood x2: ngtd  Thank you for allowing pharmacy to be a part of this patient's care.  Isaac BlissMichael Sheily Lineman, PharmD, BCPS, [facility] Clinical Pharmacist Pager [phone] 03/07/2015 9:12 PM

## 2015-03-07 NOTE — Progress Notes (Addendum)
[facility]  Subjective  - Intubated, opens eyes, but does not follow commands.   Objective 154/84 102 97 F (36.1 C) (Oral) 17 100%  Intake/Output Summary (Last 24 hours) at 03/07/15 0828 Last data filed at 03/07/15 0600  Gross per 24 hour  Intake 2305.92 ml  Output   1545 ml  Net 760.92 ml    Right leg incisions clean and dry, 4 x 4 place in groin to keep incisional area dry Palpable DP bilaterally Heart RRR    Assessment/Planning: POD # 5 right femoral to below-knee popliteal artery bypass with composite vein graft. Graft is patent with brisk Doppler flow in the right foot. Her drains are out.  Hypertrophic cardiomyopathy with cardiogenic shock - Shock resolved.  Increased sedation needs caused hypotension requiring vasopressor support. Precedex gtt to spare Fentanyl & Versed PRN for discomfort d/c fentanyl drip per CCM.  Vancomycin & Levaquin  Lovenox for DVT prophylaxis SCDs for DVT prophylaxis  Continuing tube feeds Protonix via tube daily  COLLINS, EMMA MAUREEN 03/07/2015 8:28 AM -- 2+ DP pulse All incisions clean and healing Limited currently by VDRF and balance between cardiac function Appreciate assistance of critical care and cardiology in these areas Hopefully extubate if mental status improves Husband updated at bedside  Fabienne Brunsharles Fields, MD [facility] Office: [phone] Pager: [phone]  Laboratory Lab Results:  Recent Labs  03/06/15 0351 03/07/15 0338  WBC 10.4 12.1*  HGB 12.0 12.2  HCT 37.9 39.8  PLT 137* 171   BMET  Recent Labs  03/06/15 0351 03/07/15 0338  NA 135 140  K 4.7 4.8  CL 97* 96*  CO2 34* 37*  GLUCOSE 122* 158*  BUN 22* 26*  CREATININE 0.57 0.57  CALCIUM 8.3* 9.0    COAG Lab Results  Component Value Date   INR 1.01 02/27/2015   No results found for: PTT

## 2015-03-07 NOTE — Progress Notes (Signed)
Dr cooper called with cvp result of 6. Order given to give a 250ml bolus of NS. Will cont to monitor and assess. While rounding in the room pt was very restless Dr Excell Seltzerooper instructed to increase sedation to achieve comfort level for the patient and if need be turn neo drip back on if BP decreased while obtaining comfort.

## 2015-03-07 NOTE — Progress Notes (Signed)
Patient Name: Beverly Li Date of Encounter: 03/07/2015  Active Problems:   PAD (peripheral artery disease) (HCC)   Acute respiratory failure with hypoxia (HCC)   Encounter for central line placement   Hypertrophic obstructive cardiomyopathy (HCC)   Demand ischemia (HCC)   Hypophosphatemia   Acute on chronic diastolic CHF (congestive heart failure), NYHA class 4 [facility])   Primary Cardiologist: Dr. Anne Fu Patient Profile: 69 yo female w/ PMH of hypertrophic cardiomyopathy, HTN, and PAD admitted for right fem-pop bypass on 03/02/2015 who developed hemodynamic instability while in the OR. Echo at that time showed a dynamic outflow gradient.   SUBJECTIVE: Intubated. Eyes half open, but does not respond to command, does not track. Per nurse, SBP dropped this morning after starting precedex, neo restarted, however SBP still in 70s. Making urine. Nurse also noted thicker sputum on suction.  OBJECTIVE Filed Vitals:   03/07/15 1030 03/07/15 1040 03/07/15 1045 03/07/15 1050  BP: 72/49  Pulse: 74 72 74 73  Temp:      TempSrc:      Resp: Height:      Weight:      SpO2: 100% 100% 100% 100%    Intake/Output Summary (Last 24 hours) at 03/07/15 1107 Last data filed at 03/07/15 1000  Gross per 24 hour  Intake 2230.92 ml  Output   1690 ml  Net 540.92 ml   Filed Weights   03/05/15 0500 03/06/15 0600 03/07/15 0428  Weight: 145 lb 15.1 oz (66.2 kg) 144 lb 6.4 oz (65.5 kg) 146 lb 2.6 oz (66.3 kg)    PHYSICAL EXAM General: Well developed, well nourished, female. Intubated, not followup command. Head: Normocephalic, atraumatic.  Neck: Supple without bruits, JVD not elevated. Lungs: Intubated and sedated, diffuse rhonchi Heart: RRR, S1, S2, no S3, S4, 2/6 SEM at LLSB; no rub. Abdomen: Mildly distended abdomen Extremities: No clubbing, cyanosis, or edema. Distal pedal pulses are 2+ bilaterally. Neuro: Does not follow command, sedated.    LABS: CBC:  Recent Labs  03/06/15 0351 03/07/15 0338  WBC 10.4 12.1*  NEUTROABS  --  9.7*  HGB 12.0 12.2  HCT 37.9 39.8  MCV 95.9 98.0  PLT 137* 171   Basic Metabolic Panel:  Recent Labs  16/10/96 0414 03/06/15 0351 03/06/15 1105 03/07/15 0338  NA 136 135  --  140  K 3.6 4.7  --  4.8  CL 104 97*  --  96*  CO2 26 34*  --  37*  GLUCOSE 138* 122*  --  158*  BUN 16 22*  --  26*  CREATININE 0.59 0.57  --  0.57  CALCIUM 7.2* 8.3*  --  9.0  MG 1.9  --   --  2.2  PHOS 1.6*  --  2.9 3.3   TELE:   NSR with rate in 80's - 90's.      ECHO: 03/04/2015 Study Conclusions - Left ventricle: The cavity size was normal. Wall thickness was increased in a pattern of moderate LVH. Systolic function was vigorous. The estimated ejection fraction was in the range of 65% to 70%. Wall motion was normal; there were no regional wall motion abnormalities. Features are consistent with a pseudonormal left ventricular filling pattern, with concomitant abnormal relaxation and increased filling pressure (grade 2 diastolic dysfunction). - Aortic valve: Mildly to moderately calcified annulus. - Left atrium: The atrium was mildly dilated. - Pericardium, extracardiac: There was a left pleural effusion.  Radiology/Studies: Dg  Chest Port 1 View: 03/06/2015  CLINICAL DATA:  69 year old female status post right-sided fem-pop bypass graft and right femoral endarterectomy. History of claudication. EXAM: PORTABLE CHEST 1 VIEW COMPARISON:  Chest x-ray 03/05/2015. FINDINGS: An endotracheal tube is in place with tip 2.8 cm above the carina. There is a left-sided internal jugular central venous catheter with tip terminating in the distal superior vena cava. A nasogastric tube is seen extending into the stomach, however, the tip of the nasogastric tube extends below the lower margin of the image. Lung volumes are low. Extensive opacities throughout the mid to lower lungs bilaterally may reflect  areas of atelectasis and/or airspace consolidation. Moderate bilateral pleural effusions. Heart size is mildly enlarged. Cephalization of the pulmonary vasculature. The patient is rotated to the right on today's exam, resulting in distortion of the mediastinal contours and reduced diagnostic sensitivity and specificity for mediastinal pathology. Atherosclerosis in the thoracic aorta. IMPRESSION: 1. Support apparatus, as above. 2. The appearance the chest is very similar to the prior examination. Findings may simply reflect congestive heart failure, with extensive bibasilar atelectasis in the setting of moderate bilateral pleural effusions, however, underlying airspace consolidation throughout the mid to lower lungs from infectious etiologies or aspiration is not excluded. 3. Atherosclerosis. Electronically Signed   By: Trudie Reed M.D.   On: 03/06/2015 07:16   Dg Chest Port 1 View: 03/05/2015  CLINICAL DATA:  Respiratory failure. EXAM: PORTABLE CHEST 1 VIEW COMPARISON:  03/04/2015 FINDINGS: Endotracheal tube, enteric catheter, left internal jugular approach central venous catheter stable. Cardiomediastinal silhouette is enlarged. Mediastinal contours appear intact. There is no evidence of pneumothorax. Bilateral lower lobe predominance airspace opacities are not significantly changed. Osseous structures are without acute abnormality. Soft tissues are grossly normal. IMPRESSION: No significant change in the aeration of the lungs with bilateral lower lobe airspace opacities, which may represent bilateral pleural effusions with atelectasis or airspace consolidation. Stable supporting lines and tubes. Electronically Signed   By: Ted Mcalpine M.D.   On: 03/05/2015 09:05     Current Medications:  . antiseptic oral rinse  7 mL Mouth Rinse QID  . aspirin  324 mg Oral Daily  . B-complex with vitamin C  1 tablet Oral Daily  . budesonide (PULMICORT) nebulizer solution  0.5 mg Nebulization BID  .  buPROPion  100 mg Oral BID  . chlorhexidine gluconate  15 mL Mouth Rinse BID  . docusate sodium  100 mg Oral Daily  . enoxaparin (LOVENOX) injection  40 mg Subcutaneous Q24H  . feeding supplement (PRO-STAT SUGAR FREE 64)  30 mL Per Tube Daily  . ipratropium-albuterol  3 mL Nebulization Q6H  . levofloxacin (LEVAQUIN) IV  750 mg Intravenous Q24H  . multivitamin with minerals  1 tablet Oral QHS  . pantoprazole sodium  40 mg Per Tube Daily  . rosuvastatin  5 mg Oral QHS  . sodium chloride  10-40 mL Intracatheter Q12H  . vancomycin  750 mg Intravenous Q12H   . sodium chloride 10 mL/hr at 03/02/15 1427  . dexmedetomidine 0.4 mcg/kg/hr (03/07/15 1102)  . feeding supplement (JEVITY 1.2 CAL) 1,000 mL (03/07/15 1000)  . fentaNYL infusion INTRAVENOUS Stopped (03/07/15 1015)  . lactated ringers 10 mL/hr at 03/07/15 1000  . phenylephrine (NEO-SYNEPHRINE) Adult infusion 25 mcg/min (03/07/15 1102)    ASSESSMENT AND PLAN:  1. Hypertrophic obstructive cardiomyopathy - known history of hypertrophic cardiomyopathy. On 03/02/2015 she developed hemodynamic instability while in the OR. Echo at that time showed a dynamic outflow gradient. - Continue to  avoid beta agonist such as epinephrine, norepinephrine, dobutamine, dopamine - Avoid diuresis if possible as this would exacerbate HOCM physiology by decreasing preload.  - Trying to avoid excessive PEEP which would increase intrathoracic pressure which would then decrease preload and potentially worsen obstructive physiology. - drastic drop in BP this morning from hypertensive to hypotensive after switching to Precedex. SBP now 70s, neosynephrine restarted. May need to restart levophed as well. Extremities warm on exam. Per nurse, she is making at least 60ml of urine per hour. Need to bring her MAP to at least 65mmHg. - lung continue to sound very junky, would continue abx (levoquin and vanc) to cover aspiration PNA. Received 20mg  IV lasix yesterday, would  hold off on further diuretic until BP improve.   2. PAF - Developed atrial fibrillation on 03/04/2015. Converted to NSR on 03/05/2015 on amiodarone. - maintaining NSR   3. Elevated troponin - cyclic troponin values were 2.950.03, 0.04, and 0.06.  - Minimally elevated, flat, demand ischemia in the setting of hypertrophic obstructive cardiomyopathy and transient hypotension  4. Peripheral vascular disease - Status post femoropopliteal bypass - per Vascular Surgery  5. Hypomagnesemia - Resolved  6. HCAP - on Vancomycin and Levaquin - per Critical Care  7. VDRF - CCM managing vent.   Ramond DialSigned, Hao Meng PA Pager: [phone]  Patient seen, examined. Available data reviewed. Agree with findings, assessment, and plan as outlined by Azalee CourseHao Meng, PA-C. The patient's hemodynamics remain tenuous based on level of sedation. Raises concern for whether she may be volume deplete. Will check CVP and give fluids if CVP < 10. Otherwise continue current Rx. Agree no diuretics today.  Tonny BollmanMichael Necha Harries, M.D. 03/07/2015 3:05 PM

## 2015-03-07 NOTE — Progress Notes (Signed)
Elink called and notified that current sedation (Precedex 1.372mcg/kg/hr and Fentyal 25-3150mcg q1hr) ineffective as pt is thrashing around in the bed, biting ett tube, and dyssynchrony with the vent. Orders received to increase upper limit on precdex gtt to 2.1. Will continue to closely monitor pt and heart rate while titrating up. Modena JanskyKevin Taneah Masri RN  2 south.

## 2015-03-08 ENCOUNTER — Inpatient Hospital Stay (HOSPITAL_COMMUNITY): Payer: Medicare Other

## 2015-03-08 DIAGNOSIS — I4891 Unspecified atrial fibrillation: Secondary | ICD-10-CM

## 2015-03-08 DIAGNOSIS — R410 Disorientation, unspecified: Secondary | ICD-10-CM

## 2015-03-08 LAB — CBC WITH DIFFERENTIAL/PLATELET
Basophils Absolute: 0 10*3/uL (ref 0.0–0.1)
Basophils Relative: 0 %
EOS ABS: 0 10*3/uL (ref 0.0–0.7)
EOS PCT: 0 %
HCT: 36.2 % (ref 36.0–46.0)
HEMOGLOBIN: 11.5 g/dL — AB (ref 12.0–15.0)
LYMPHS PCT: 15 %
Lymphs Abs: 1.7 10*3/uL (ref 0.7–4.0)
MCH: 30.1 pg (ref 26.0–34.0)
MCHC: 31.8 g/dL (ref 30.0–36.0)
MCV: 94.8 fL (ref 78.0–100.0)
MONO ABS: 1.5 10*3/uL — AB (ref 0.1–1.0)
Monocytes Relative: 14 %
NEUTROS PCT: 71 %
Neutro Abs: 7.8 10*3/uL — ABNORMAL HIGH (ref 1.7–7.7)
PLATELETS: 166 10*3/uL (ref 150–400)
RBC: 3.82 MIL/uL — AB (ref 3.87–5.11)
RDW: 13.9 % (ref 11.5–15.5)
WBC: 11 10*3/uL — AB (ref 4.0–10.5)

## 2015-03-08 LAB — CULTURE, BLOOD (ROUTINE X 2)
CULTURE: NO GROWTH
CULTURE: NO GROWTH

## 2015-03-08 LAB — RENAL FUNCTION PANEL
Albumin: 2.2 g/dL — ABNORMAL LOW (ref 3.5–5.0)
Anion gap: 9 (ref 5–15)
BUN: 19 mg/dL (ref 6–20)
CALCIUM: 8.7 mg/dL — AB (ref 8.9–10.3)
CHLORIDE: 95 mmol/L — AB (ref 101–111)
CO2: 36 mmol/L — ABNORMAL HIGH (ref 22–32)
CREATININE: 0.36 mg/dL — AB (ref 0.44–1.00)
GFR calc Af Amer: >60 mL/min (ref >60)
GFR calc non Af Amer: >60 mL/min (ref >60)
Glucose, Bld: 158 mg/dL — ABNORMAL HIGH (ref 65–99)
Phosphorus: 2 mg/dL — ABNORMAL LOW (ref 2.5–4.6)
Potassium: 3.9 mmol/L (ref 3.5–5.1)
SODIUM: 140 mmol/L (ref 135–145)

## 2015-03-08 LAB — GLUCOSE, CAPILLARY
GLUCOSE-CAPILLARY: 163 mg/dL — AB (ref 65–99)
GLUCOSE-CAPILLARY: 145 mg/dL — AB (ref 65–99)
Glucose-Capillary: 173 mg/dL — ABNORMAL HIGH (ref 65–99)

## 2015-03-08 LAB — MAGNESIUM: MAGNESIUM: 1.8 mg/dL (ref 1.7–2.4)

## 2015-03-08 LAB — HEPARIN LEVEL (UNFRACTIONATED): HEPARIN UNFRACTIONATED: 0.35 [IU]/mL (ref 0.30–0.70)

## 2015-03-08 MED ORDER — FUROSEMIDE 10 MG/ML IJ SOLN
20.0000 mg | Freq: Every day | INTRAMUSCULAR | Status: DC
  Administered 2015-03-08: 20 mg via INTRAVENOUS
  Filled 2015-03-08 (×2): qty 2

## 2015-03-08 MED ORDER — AMIODARONE HCL IN DEXTROSE 360-4.14 MG/200ML-% IV SOLN
30.0000 mg/h | INTRAVENOUS | Status: DC
  Administered 2015-03-08 – 2015-03-09 (×2): 30 mg/h via INTRAVENOUS
  Filled 2015-03-08 (×5): qty 200

## 2015-03-08 MED ORDER — ANTISEPTIC ORAL RINSE SOLUTION (CORINZ)
7.0000 mL | OROMUCOSAL | Status: DC
  Administered 2015-03-08 – 2015-03-10 (×19): 7 mL via OROMUCOSAL

## 2015-03-08 MED ORDER — SENNOSIDES-DOCUSATE SODIUM 8.6-50 MG PO TABS: 1.0000 | ORAL_TABLET | Freq: Every day | ORAL | Status: DC

## 2015-03-08 MED ORDER — BISACODYL 10 MG RE SUPP
10.0000 mg | Freq: Once | RECTAL | Status: AC
  Administered 2015-03-08: 10 mg via RECTAL
  Filled 2015-03-08: qty 1

## 2015-03-08 MED ORDER — HEPARIN (PORCINE) IN NACL 100-0.45 UNIT/ML-% IJ SOLN
1150.0000 [IU]/h | INTRAMUSCULAR | Status: AC
  Administered 2015-03-08: 900 [IU]/h via INTRAVENOUS
  Filled 2015-03-08 (×7): qty 250

## 2015-03-08 MED ORDER — MAGNESIUM SULFATE IN D5W 10-5 MG/ML-% IV SOLN
1.0000 g | Freq: Once | INTRAVENOUS | Status: AC
  Administered 2015-03-08: 1 g via INTRAVENOUS
  Filled 2015-03-08: qty 100

## 2015-03-08 MED ORDER — AMIODARONE LOAD VIA INFUSION
150.0000 mg | Freq: Once | INTRAVENOUS | Status: AC
  Administered 2015-03-08: 150 mg via INTRAVENOUS
  Filled 2015-03-08: qty 83.34

## 2015-03-08 MED ORDER — FENTANYL CITRATE (PF) 100 MCG/2ML IJ SOLN
50.0000 ug | INTRAMUSCULAR | Status: DC | PRN
  Administered 2015-03-08 – 2015-03-09 (×10): 100 ug via INTRAVENOUS
  Filled 2015-03-08 (×10): qty 2

## 2015-03-08 MED ORDER — DEXMEDETOMIDINE HCL IN NACL 400 MCG/100ML IV SOLN
0.4000 ug/kg/h | INTRAVENOUS | Status: DC
  Administered 2015-03-08: 2.4 ug/kg/h via INTRAVENOUS
  Administered 2015-03-08: 2 ug/kg/h via INTRAVENOUS
  Administered 2015-03-08: 2.4 ug/kg/h via INTRAVENOUS
  Administered 2015-03-08: 1.6 ug/kg/h via INTRAVENOUS
  Administered 2015-03-08: 2 ug/kg/h via INTRAVENOUS
  Administered 2015-03-08: 2.4 ug/kg/h via INTRAVENOUS
  Administered 2015-03-09 (×7): 2 ug/kg/h via INTRAVENOUS
  Administered 2015-03-09: 2.4 ug/kg/h via INTRAVENOUS
  Filled 2015-03-08 (×15): qty 100

## 2015-03-08 MED ORDER — SENNOSIDES 8.8 MG/5ML PO SYRP
5.0000 mL | ORAL_SOLUTION | Freq: Two times a day (BID) | ORAL | Status: DC
  Administered 2015-03-08 – 2015-03-09 (×3): 5 mL via ORAL
  Filled 2015-03-08 (×6): qty 5

## 2015-03-08 MED ORDER — BISACODYL 10 MG RE SUPP
10.0000 mg | Freq: Every day | RECTAL | Status: DC | PRN
Start: 2015-03-08 — End: 2015-03-15

## 2015-03-08 MED ORDER — CHLORHEXIDINE GLUCONATE 0.12% ORAL RINSE (MEDLINE KIT)
15.0000 mL | Freq: Two times a day (BID) | OROMUCOSAL | Status: DC
  Administered 2015-03-08 – 2015-03-10 (×4): 15 mL via OROMUCOSAL

## 2015-03-08 MED ORDER — INSULIN ASPART 100 UNIT/ML ~~LOC~~ SOLN
0.0000 [IU] | SUBCUTANEOUS | Status: DC
  Administered 2015-03-08: 1 [IU] via SUBCUTANEOUS
  Administered 2015-03-08 (×2): 2 [IU] via SUBCUTANEOUS
  Administered 2015-03-09 (×4): 1 [IU] via SUBCUTANEOUS
  Administered 2015-03-09: 2 [IU] via SUBCUTANEOUS
  Administered 2015-03-10: 1 [IU] via SUBCUTANEOUS

## 2015-03-08 MED ORDER — FLEET ENEMA 7-19 GM/118ML RE ENEM
1.0000 | ENEMA | Freq: Once | RECTAL | Status: AC
  Administered 2015-03-09: 1 via RECTAL
  Filled 2015-03-08: qty 1

## 2015-03-08 MED ORDER — AMIODARONE HCL IN DEXTROSE 360-4.14 MG/200ML-% IV SOLN
60.0000 mg/h | INTRAVENOUS | Status: AC
  Administered 2015-03-08: 60 mg/h via INTRAVENOUS
  Filled 2015-03-08 (×2): qty 200

## 2015-03-08 NOTE — Progress Notes (Signed)
eLink Physician-Brief Progress Note Patient Name: Hyacinth MeekerFrances B Mcaffee DOB: Aug 22, 1945 MRN: [id number]   Date of Service  03/08/2015  HPI/Events of Note  Back into Afib with HR 110-150. BP stable  eICU Interventions  Restart Amiodarone     Intervention Category Major Interventions: Arrhythmia - evaluation and management  Kato Wieczorek 03/08/2015, 6:53 AM

## 2015-03-08 NOTE — Progress Notes (Signed)
ANTICOAGULATION CONSULT NOTE - Initial Consult  Pharmacy Consult for Heparin Indication: atrial fibrillation  Allergies  Allergen Reactions  . Amoxicillin Other (See Comments)    UTI  . Atenolol Cough  . Sulfa Antibiotics Nausea Only  . Codeine Rash    Patient Measurements: Height: 5\' 5"  (165.1 cm) Weight: 141 lb 1.5 oz (64 kg) IBW/kg (Calculated) : 57 Heparin Dosing Weight: 64 kg  Vital Signs: Temp: 98.2 F (36.8 C) (11/23 1200) Temp Source: Axillary (11/23 1200) BP: 145/89 mmHg (11/23 1400) Pulse Rate: 96 (11/23 1300)  Labs:  Recent Labs  03/06/15 0351 03/07/15 0338 03/08/15 0345  HGB 12.0 12.2 11.5*  HCT 37.9 39.8 36.2  PLT 137* 171 166  CREATININE 0.57 0.57 0.36*    Estimated Creatinine Clearance: 59.7 mL/min (by C-G formula based on Cr of 0.36).   Medical History: Past Medical History  Diagnosis Date  . Atrial arrhythmia   . Hypertension   . Insomnia   . Systolic murmur   . Atrial septal hypertrophy     , proximal, without significant gradient  . Mild aortic sclerosis (HCC)   . Osteopenia   . Syncope     , Vagal  . Dysrhythmia     afib  . GERD (gastroesophageal reflux disease)   . History of kidney stones   . Hypertrophic cardiomyopathy (HCC)     severe LV basilar hypertrophy witn no evidence of significant outflow tract obstruction, EF 65-70%, mild LAE, mild TR, grade 1a diastolic dysfunction 05/15/10 (Dr. Donato SchultzMark Skains)  . Atrial fibrillation (HCC)   . Peripheral vascular disease (HCC)   . Anxiety   . Arthritis   . Bell's palsy     when pt. was [age] yrs old, when under stress the left side of face will droop.    Medications:  Scheduled:  . antiseptic oral rinse  7 mL Mouth Rinse 10 times per day  . aspirin  324 mg Oral Daily  . B-complex with vitamin C  1 tablet Oral Daily  . budesonide (PULMICORT) nebulizer solution  0.5 mg Nebulization BID  . buPROPion  100 mg Oral BID  . chlorhexidine gluconate  15 mL Mouth Rinse BID  . feeding  supplement (PRO-STAT SUGAR FREE 64)  30 mL Per Tube BID  . furosemide  20 mg Intravenous Daily  . insulin aspart  0-9 Units Subcutaneous 6 times per day  . ipratropium-albuterol  3 mL Nebulization Q6H  . levofloxacin (LEVAQUIN) IV  750 mg Intravenous Q24H  . multivitamin with minerals  1 tablet Oral QHS  . pantoprazole sodium  40 mg Per Tube Daily  . rosuvastatin  5 mg Oral QHS  . sennosides  5 mL Oral BID  . sodium chloride  10-40 mL Intracatheter Q12H  . vancomycin  1,500 mg Intravenous Q12H    Assessment: 69 yo female s/p  right below knee popliteal artery femoropopliteal bypass and femoral artery endarterectomy on the right.  Developed acute post-op respiratory failure.  Received low-dose heparin infusion around the time of the procedure, but most recently only receiving Lovenox at prophylactic doses.  Last dose of Lovenox 40 mg given today at 8 AM.  Pharmacy now asked to begin IV heparin for new afib.  Goal of Therapy:  Heparin level 0.3-0.7 units/ml Monitor platelets by anticoagulation protocol: Yes   Plan:  1. Start IV heparin at rate of 900 units/hr, no bolus for now given AM Lovenox dose. 2. Check heparin level 6 hrs after gtt starts. 3. Daily heparin  level and CBC. 4. F/u plans for oral anticoagulation eventually.  Tad Moore, BCPS  Clinical Pharmacist Pager [phone]  03/08/2015 2:55 PM

## 2015-03-08 NOTE — Progress Notes (Signed)
ANTICOAGULATION CONSULT NOTE  Pharmacy Consult for Heparin Indication: atrial fibrillation  Allergies  Allergen Reactions  . Amoxicillin Other (See Comments)    UTI  . Atenolol Cough  . Sulfa Antibiotics Nausea Only  . Codeine Rash    Patient Measurements: Height:  (165.1 cm) Weight: 141 lb 1.5 oz (64 kg) IBW/kg (Calculated) : 57 Heparin Dosing Weight: 64 kg  Vital Signs: Temp: 97.8 F (36.6 C) (11/23 1942) Temp Source: Axillary (11/23 1942) BP: 153/82 mmHg (11/23 2030) Pulse Rate: 56 (11/23 2030)  Labs:  Recent Labs  03/06/15 0351 03/07/15 0338 03/08/15 0345 03/08/15 2016  HGB 12.0 12.2 11.5*  --   HCT 37.9 39.8 36.2  --   PLT 137* 171 166  --   HEPARINUNFRC  --   --   --  0.35  CREATININE 0.57 0.57 0.36*  --     Estimated Creatinine Clearance: 59.7 mL/min (by C-G formula based on Cr of 0.36).   Medical History: Past Medical History  Diagnosis Date  . Atrial arrhythmia   . Hypertension   . Insomnia   . Systolic murmur   . Atrial septal hypertrophy     , proximal, without significant gradient  . Mild aortic sclerosis (HCC)   . Osteopenia   . Syncope     , Vagal  . Dysrhythmia     afib  . GERD (gastroesophageal reflux disease)   . History of kidney stones   . Hypertrophic cardiomyopathy (HCC)     severe LV basilar hypertrophy witn no evidence of significant outflow tract obstruction, EF 65-70%, mild LAE, mild TR, grade 1a diastolic dysfunction 05/15/10 (Dr. Donato Schultz)  . Atrial fibrillation (HCC)   . Peripheral vascular disease (HCC)   . Anxiety   . Arthritis   . Bell's palsy     when pt. was 69 yrs old, when under stress the left side of face will droop.    Medications:  Scheduled:  . antiseptic oral rinse  7 mL Mouth Rinse 10 times per day  . aspirin  324 mg Oral Daily  . B-complex with vitamin C  1 tablet Oral Daily  . budesonide (PULMICORT) nebulizer solution  0.5 mg Nebulization BID  . buPROPion  100 mg Oral BID  . chlorhexidine  gluconate  15 mL Mouth Rinse BID  . feeding supplement (PRO-STAT SUGAR FREE 64)  30 mL Per Tube BID  . furosemide  20 mg Intravenous Daily  . insulin aspart  0-9 Units Subcutaneous 6 times per day  . ipratropium-albuterol  3 mL Nebulization Q6H  . levofloxacin (LEVAQUIN) IV  750 mg Intravenous Q24H  . multivitamin with minerals  1 tablet Oral QHS  . pantoprazole sodium  40 mg Per Tube Daily  . rosuvastatin  5 mg Oral QHS  . sennosides  5 mL Oral BID  . sodium chloride  10-40 mL Intracatheter Q12H  . [START ON 03/09/2015] sodium phosphate  1 enema Rectal Once  . vancomycin  1,500 mg Intravenous Q12H    Assessment: 69 yo female s/p  right below knee popliteal artery femoropopliteal bypass and femoral artery endarterectomy on the right.  Developed acute post-op respiratory failure.  Received low-dose heparin infusion around the time of the procedure, but most recently only receiving Lovenox at prophylactic doses.  Last dose of Lovenox 40 mg given today at 8 AM.  Pharmacy now asked to begin IV heparin for new afib.  Initial heparin level 0.35 on 900 units/hr.  No bleeding or complications  noted.  Goal of Therapy:  Heparin level 0.3-0.7 units/ml Monitor platelets by anticoagulation protocol: Yes   Plan:  1. Continue IV heparin at rate of 900 units/hr.  Confirm level with AM labs. 2. Daily heparin level and CBC. 4. F/u plans for oral anticoagulation eventually.  Tad MooreJessica Kimberlynn Lumbra, Pharm D, BCPS  Clinical Pharmacist Pager [phone]  03/08/2015 10:03 PM

## 2015-03-08 NOTE — Progress Notes (Addendum)
  [facility] Progress Note  Subjective  - POD # 6  Objective Filed Vitals:   03/08/15 0733 03/08/15 0755  BP:    Pulse:  104  Temp: 98.8 F (37.1 C)   Resp:  24  BP 147/98 Afib rate 110s-120s 02 100% Fi02 40%  Intake/Output Summary (Last 24 hours) at 03/08/15 0836 Last data filed at 03/08/15 0600  Gross per 24 hour  Intake 2102.45 ml  Output   2950 ml  Net -847.55 ml   Right leg incisions healing well. Palpable right dorsalis pedis pulse.   Assessment/Planning: 69 y.o. female is s/p: right femoral to below-knee popliteal bypass with composite vein graft 6 Days Post-Op   Bypass is patent with palpable DP pulse. Incisions are healing well.  Very agitated this am, requiring additional sedation.  Back into afib this am. On amio gtt.  Appreciate critical care and cardiology assistance.   Raymond GurneyKimberly A Trinh 03/08/2015 8:36 AM --  Exam as above Pt son updated Main limitation is still vent dependence  Fabienne Brunsharles Osa Fogarty, MD [facility] [place] Office: [phone] Pager: [phone]   Laboratory CBC    Component Value Date/Time   WBC 11.0* 03/08/2015 0345   HGB 11.5* 03/08/2015 0345   HCT 36.2 03/08/2015 0345   PLT 166 03/08/2015 0345    BMET    Component Value Date/Time   NA 140 03/08/2015 0345   K 3.9 03/08/2015 0345   CL 95* 03/08/2015 0345   CO2 36* 03/08/2015 0345   GLUCOSE 158* 03/08/2015 0345   BUN 19 03/08/2015 0345   CREATININE 0.36* 03/08/2015 0345   CALCIUM 8.7* 03/08/2015 0345   GFRNONAA >60 03/08/2015 0345   GFRAA >60 03/08/2015 0345    COAG Lab Results  Component Value Date   INR 1.01 02/27/2015   No results found for: PTT  Antibiotics Anti-infectives    Start     Dose/Rate Route Frequency Ordered Stop   03/08/15 0600  vancomycin (VANCOCIN) 1,500 mg in sodium chloride 0.9 % 500 mL IVPB     1,500 mg 250 mL/hr over 120 Minutes Intravenous Every 12 hours 03/07/15 2108     03/04/15 0800   vancomycin (VANCOCIN) IVPB 750 mg/150 ml premix  Status:  Discontinued     750 mg 150 mL/hr over 60 Minutes Intravenous Every 12 hours 03/03/15 2017 03/07/15 2108   03/03/15 2015  vancomycin (VANCOCIN) IVPB 1000 mg/200 mL premix     1,000 mg 200 mL/hr over 60 Minutes Intravenous  Once 03/03/15 2001 03/03/15 2140   03/03/15 2015  levofloxacin (LEVAQUIN) IVPB 750 mg     750 mg 100 mL/hr over 90 Minutes Intravenous Every 24 hours 03/03/15 2003     03/02/15 1800  cefUROXime (ZINACEF) 1.5 g in dextrose 5 % 50 mL IVPB     1.5 g 100 mL/hr over 30 Minutes Intravenous Every 12 hours 03/02/15 1322 03/03/15 1107   03/02/15 1300  vancomycin (VANCOCIN) IVPB 1000 mg/200 mL premix  Status:  Discontinued     1,000 mg 200 mL/hr over 60 Minutes Intravenous Every 12 hours 03/02/15 1255 03/02/15 1320   03/02/15 0645  cefUROXime (ZINACEF) 1.5 g in dextrose 5 % 50 mL IVPB     1.5 g 100 mL/hr over 30 Minutes Intravenous To [facility] 03/01/15 1240 03/02/15 0755       Maris BergerKimberly Trinh, PA-C [facility] Office: [phone] Pager: [phone] 03/08/2015 8:36 AM

## 2015-03-08 NOTE — Progress Notes (Signed)
    Subjective:  Intubated, sedated. Husband at bedside - lengthy discussion  Objective:  Vital Signs in the last 24 hours: Temp:  [98.2 F (36.8 C)-99.9 F (37.7 C)] 98.2 F (36.8 C) (11/23 1200) Pulse Rate:  [57-116] 112 (11/23 1129) Resp:  [11-24] 13 (11/23 1200) BP: (71-219)/(49-131) 111/77 mmHg (11/23 1200) SpO2:  [82 %-100 %] 96 % (11/23 1200) FiO2 (%):  [40 %] 40 % (11/23 1200) Weight:  [141 lb 1.5 oz (64 kg)] 141 lb 1.5 oz (64 kg) (11/23 0500)  Intake/Output from previous day: 11/22 0701 - 11/23 0700 In: 2766.5 [I.V.:666.5; NG/GT:1450; IV Piggyback:650] Out: 3225 [Urine:3225]  Physical Exam: Pt is intubated, sedated HEENT: normal Neck: JVP - normal Lungs: coarse rhonchi throughout CV: irregular with 3/6 systolic murmur at the LSB Abd: soft, NT Ext: no C/C/E Skin: warm/dry no rash   Lab Results:  Recent Labs  03/07/15 0338 03/08/15 0345  WBC 12.1* 11.0*  HGB 12.2 11.5*  PLT 171 166    Recent Labs  03/07/15 0338 03/08/15 0345  NA 140 140  K 4.8 3.9  CL 96* 95*  CO2 37* 36*  GLUCOSE 158* 158*  BUN 26* 19  CREATININE 0.57 0.36*   No results for input(s): TROPONINI in the last 72 hours.  Invalid input(s): CK, MB  Tele: Atrial fibrillation HR 90-110 bpm at present  Assessment/Plan:  1. Acute diastolic CHF - volume overload post-operatively, underlying HCM 2. Atrial fib with RVR - now on IV amiodarone 3. Hypertrophic cardiomyopathy with cardiogenic shock, improved 4. VDRF - per CCM  Continue IV amiodarone. Previously developed junctional brady after converting to sinus. After she converts, would change to oral amiodarone if she can tolerate in order to reduce risk of recurrent AF. Start IV heparin for thromboembolism prophylaxis in setting of AF. Begin IV lasix 20 mg daily in setting of volume overload - dry weight 128# - weight up 12 # from baseline.  Tonny Bollmanooper, Dartanyon Frankowski, M.D. 03/08/2015, 2:19 PM

## 2015-03-08 NOTE — Progress Notes (Signed)
PULMONARY / CRITICAL CARE MEDICINE   Name: Beverly Li MRN: [id number] DOB: 06-16-1945    ADMISSION DATE:  03/02/2015 CONSULTATION DATE:  03/02/2015  REFERRING MD :  Dr Edilia Bo [facility]  CHIEF COMPLAINT:  Acute post op resp failure  BRIEF: 69 year old female with baseline claudication but no dyspnea but has some HOCM (followed by Dr Anne Fu) but at baseline and has had a normal cardiac stress test and is able to climb stairs without dyspnea. He underwent on 03/02/2015 right below knee popliteal artery femoropopliteal bypass and femoral artery endarterectomy on the right. Overall operative time was approximately 4 hours according to anesthesia. First 2 hours no problem but subsequently started having severe evidence of hypertrophic obstructive cardiomyopathy as evidenced by intraoperative echo and periods of hypotension. Status post 4-5 liters of fluid and for albumin infusions. Brought into the intensive care unit postoperatively and was spontaneously weaned off Neo-Synephrine but was found to be in respiratory acidosis with severe hypoxemia and chest x-ray with acute pulmonary edema bilaterally. Pulmonary critical care called for consultation 03/02/15  EVENTS: 11/17 - Admit to hospital 11/17 - Right below knee Fem-Pop bypass w/ femoral artery endarterectomy on right 11/18 - Developed fever & antibiotics started for empiric coverage 11/20 - A fib w/ RVR & converted to NSR 11/23 - A fib w/ RVR again started on Amio gtt  STUDIES: TTE 11/19 - EF 65-70% w/ moderate LVH. Grade 2 diastolic dysfunction. Left pleural effusion. EKG 11/19 - A fib with RVR. QTc . Port CXR 11/21 - Bilateral LL opacification R>L. ETT & CVL in good position. Port CXR 11/23 - ETT nearly at aortic knob. Left IJ tip in SVC. Hazy bilateral LL opacities unchanged with minimal clearing in costophrenic angle on the right. KUB X-ray 11/23 - feeding tube projecting over distal stomach  MICROBIOLOGY: Blood Ctx  11/18>>> Trach Asp 11/18 - Oral flora.  ANTIBIOTICS: Vancomycin 11/18>>> Levaquin 11/18>>>  SUBJECTIVE:  Patient went back into atrial fibrillation with RVR overnight. Restarted on Amiodarone drip with some improvement in RVR. Still has not had a bowel movement. Was able to wean off of Fentanyl drip & Precedex limits were increased overnight. Patient's NGT was pulled out slightly today without obvious evidence of aspiration of tube feedings.  REVIEW OF SYSTEMS:  Unable to obtain as patient is intubated & has altered mentation.  VITAL SIGNS: Temp:  [98.8 F (37.1 C)-99.9 F (37.7 C)] 98.8 F (37.1 C) (11/23 0733) Pulse Rate:  [42-116] 114 (11/23 0900) Resp:  [11-24] 14 (11/23 1100) BP: (70-219)/(49-131) 104/80 mmHg (11/23 1100) SpO2:  [81 %-100 %] 100 % (11/23 0900) FiO2 (%):  [40 %] 40 % (11/23 0755) Weight:  [141 lb 1.5 oz (64 kg)] 141 lb 1.5 oz (64 kg) (11/23 0500) HEMODYNAMICS: CVP:  [6 mmHg] 6 mmHg VENTILATOR SETTINGS: Vent Mode:  [-] CPAP;PSV FiO2 (%):  [40 %] 40 % Set Rate:  [14 bmp] 14 bmp PEEP:  [5 cmH20] 5 cmH20 Pressure Support:  [10 cmH20] 10 cmH20 Plateau Pressure:  [10 cmH20-18 cmH20] 10 cmH20 INTAKE / OUTPUT:  Intake/Output Summary (Last 24 hours) at 03/08/15 1120 Last data filed at 03/08/15 1100  Gross per 24 hour  Intake 1935.17 ml  Output   2750 ml  Net -814.83 ml    PHYSICAL EXAMINATION: General:  Eyes open. Shifting in bed intermittently. No distress. Integument:  Warm & dry. No rash on exposed skin. Left IJ CVC in place. HEENT:  No scleral injection. Endotracheal tube in place. Pupils  equal. Cardiovascular:  Irregular rhythm & mildly tachycardic. Atrial fibrillation on telemetry. No edema.  Pulmonary: No wheezing today. Coarse breath sounds persist. Symmetric chest wall rise on ventilator & normal WOB on PS wean. Abdomen: Soft. Normal bowel sounds. Nondistended.  Neurological:  Spontaneously moving all 4 extremities. Doesn't follow commands. Eyes  open to voice and only intermittently nodding to questions.   PULMONARY  Recent Labs Lab 03/02/15 2011 03/04/15 0338 03/05/15 0349 03/05/15 1120 03/06/15 0342  PHART 7.381 7.482* 7.417 7.341* 7.318*  PCO2ART 40.3 35.8 46.3* 56.5* 64.3*  PO2ART 130.0* 61.0* 63.0* 68.0* 72.2*  HCO3 24.0 26.9* 29.7* 30.5* 32.1*  TCO2 32 34.0  O2SAT 99.0 93.0 91.0 92.0 93.8    CBC  Recent Labs Lab 03/06/15 0351 03/07/15 0338 03/08/15 0345  HGB 12.0 12.2 11.5*  HCT 37.9 39.8 36.2  WBC 10.4 12.1* 11.0*  PLT 137* 171 166    COAGULATION No results for input(s): INR in the last 168 hours.  CARDIAC    Recent Labs Lab 03/02/15 1055 03/02/15 1730 03/02/15 2304  TROPONINI 0.03 0.04* 0.06*   No results for input(s): PROBNP in the last 168 hours.   CHEMISTRY  Recent Labs Lab 03/03/15 0420  03/04/15 0355 03/05/15 0414 03/06/15 0351 03/06/15 1105 03/07/15 0338 03/08/15 0345  NA  --   < > 136 136 135  --  140 140  K  --   < > 3.6 3.6 4.7  --  4.8 3.9  CL  --   < > 104 104 97*  --  96* 95*  CO2  --   < > 27 26 34*  --  37* 36*  GLUCOSE  --   < > 114* 138* 122*  --  158* 158*  BUN  --   < > 15 16 22*  --  26* 19  CREATININE  --   < > 0.60 0.59 0.57  --  0.57 0.36*  CALCIUM  --   < > 8.0* 7.2* 8.3*  --  9.0 8.7*  MG 1.2*  --  2.1 1.9  --   --  2.2 1.8  PHOS 2.8  --  1.4* 1.6*  --  2.9 3.3 2.0*  < > = values in this interval not displayed. Estimated Creatinine Clearance: 59.7 mL/min (by C-G formula based on Cr of 0.36).   LIVER  Recent Labs Lab 03/07/15 0338 03/08/15 0345  ALBUMIN 2.6* 2.2*     INFECTIOUS  Recent Labs Lab 03/03/15 0850 03/04/15 0355 03/05/15 0414  LATICACIDVEN 1.3  --   --   PROCALCITON <0.10 0.28 0.24     ENDOCRINE CBG (last 3)  No results for input(s): GLUCAP in the last 72 hours.   IMAGING x48h Dg Chest Port 1 View  03/08/2015  CLINICAL DATA:  NG tube placement EXAM: PORTABLE CHEST 1 VIEW COMPARISON:  03/06/2015 FINDINGS:  Cardiomegaly. Central vascular congestion without convincing pulmonary edema. Hazy infiltrate in right lower lobe suspicious for pneumonia. Endotracheal and NG tube in place. Stable left IJ central line position. IMPRESSION: No convincing pulmonary edema. Central vascular congestion. Hazy infiltrate in right lower lobe suspicious for pneumonia. Electronically Signed   By: Natasha Mead M.D.   On: 03/08/2015 10:55   Dg Abd Portable 1v  03/08/2015  CLINICAL DATA:  NG tube placement EXAM: PORTABLE ABDOMEN - 1 VIEW COMPARISON:  12/28/2013 FINDINGS: Nasogastric tube passes well below the diaphragm. Tip lies in the distal stomach. Stomach is moderately distended with air. No  evidence of bowel obstruction on the included field of view. IMPRESSION: NG tube tip projects in the distal stomach. Electronically Signed   By: Amie Portlandavid  Ormond M.D.   On: 03/08/2015 10:55     ASSESSMENT / PLAN:  PULMONARY ETT 11/17 >> A:  Acute Hypoxic Respiratory Failure -  Secondary to acute pulmonary edema. Slowly weaning. Wheezing w/ Long h/o Tobacco Use - Probable COPD with exacerbation Chronic Hypercarbic Respiratory Failure  P:   Changed to pressure control 11/20 Continuing PS weaning Plan for SBT on PS 0/0 given cardiomyopathy Duoneb q6hr Pulmicort 0.5mg  neb bid  CARDIOVASCULAR Lt IJ CVL 11/17 >> A:  Hypertrophic Cardiomyopathy with Cardiogenic Shock - Shock resolved. Complicated by sedative effects. A fib with RVR - Recurred AM 11/23. Previously resolved on 11/20. S/P Right Fem-Pop Bypass  H/O HTN & HLD  P:  Cardiology following Amiodarone gtt w/ improving rate control Use phenylephrine to maintain MAP > 65 Avoiding tachycardia Post-op care per VVS ASA 325mg  via tube daily Crestor 5mg  via tube qhs Hold outpt norvasc, cozaar, bystolic Diuresis per Cardiology recommendations  RENAL A:   Hypophosphatemia - Mild. Holding on replacement. Hypomagnesemia - Mild. Replacing IV given Atrial  fibrillation. Hypokalemia - Resolved.   P:   Magnesium Sulfate 1gm IV Trending renal function with daily BUN/Creatinine Monitoring UOP with Foley Monitoring electrolytes daily  GASTROINTESTINAL A:  Nutrition. No BM since admission.  P:   Continuing tube feeds Protonix via tube daily Starting Liquid Senna bid Dulcolax Supp x1 today  HEMATOLOGIC A:   Leukocytosis - Improving. Anemia - Mild. No signs of active bleeding.  P:  Monitoring Hgb daily w/ CBC Lovenox for DVT prophylaxis SCDs for DVT prophylaxis  INFECTIOUS A:  HCAP H/O PCN allergy.  P:   Day#6 Vancomycin & Levaquin Plan for at least 7 days of antibiotics or >48hours afebrile - Last fever 11/21 @ 2347. Plan to reculture for any fever   ENDOCRINE A:   Hyperglycemia - On tube feedings. No h/o DM.  P:   Accu-Checks q4hr Low dose SSI algorithm Checking Hgb A1c with AM labs  NEUROLOGIC A:  Delirium H/O Insomnia H/O Anxiety  P:   RASS goal: 0  Wellbutrin 100mg  via tube bid Continuing Precedex gtt to spare Fentanyl  Fentanyl IV prn breakthrough pain Holding Versed given hypotension Holding outpt ultram & ambien for now Checking EKG to assess QTc & see if Haldol may be used  FAMILY:  Husband & son at bedside this morning and updated at length.  TODAY'S SUMMARY:  Unfortunate 69 year old female status post femoropopliteal bypass. Sedation requirements are affecting the patient's blood pressure. Switching to Precedex gtt to spare Fentanyl & Versed. Plan to attempt SBT this morning on PS 0/0 once Precedex is started. Mental status is main barrier to extubation today.  I have spent a total of 42 minutes of critical care time today caring for the patient, updating the patient's son & husband at bedside, and reviewing the patient's electronic medical record.  Donna ChristenJennings E. Jamison NeighborNestor, M.D. [facility] Pager:  [phone] After 3pm or if no response, call [phone]  03/08/2015, 11:20  AM

## 2015-03-09 LAB — CBC WITH DIFFERENTIAL/PLATELET
BASOS ABS: 0.1 10*3/uL (ref 0.0–0.1)
BASOS PCT: 1 %
EOS PCT: 1 %
Eosinophils Absolute: 0.1 10*3/uL (ref 0.0–0.7)
HEMATOCRIT: 34.5 % — AB (ref 36.0–46.0)
HEMOGLOBIN: 11.1 g/dL — AB (ref 12.0–15.0)
LYMPHS PCT: 20 %
Lymphs Abs: 2 10*3/uL (ref 0.7–4.0)
MCH: 29.6 pg (ref 26.0–34.0)
MCHC: 32.2 g/dL (ref 30.0–36.0)
MCV: 92 fL (ref 78.0–100.0)
MONOS PCT: 13 %
Monocytes Absolute: 1.3 10*3/uL — ABNORMAL HIGH (ref 0.1–1.0)
NEUTROS ABS: 6.7 10*3/uL (ref 1.7–7.7)
Neutrophils Relative %: 65 %
Platelets: 217 10*3/uL (ref 150–400)
RBC: 3.75 MIL/uL — ABNORMAL LOW (ref 3.87–5.11)
RDW: 14 % (ref 11.5–15.5)
WBC: 10.2 10*3/uL (ref 4.0–10.5)

## 2015-03-09 LAB — GLUCOSE, CAPILLARY
GLUCOSE-CAPILLARY: 118 mg/dL — AB (ref 65–99)
GLUCOSE-CAPILLARY: 125 mg/dL — AB (ref 65–99)
GLUCOSE-CAPILLARY: 137 mg/dL — AB (ref 65–99)
GLUCOSE-CAPILLARY: 150 mg/dL — AB (ref 65–99)
Glucose-Capillary: 145 mg/dL — ABNORMAL HIGH (ref 65–99)
Glucose-Capillary: 150 mg/dL — ABNORMAL HIGH (ref 65–99)
Glucose-Capillary: 160 mg/dL — ABNORMAL HIGH (ref 65–99)

## 2015-03-09 LAB — TROPONIN I
TROPONIN I: 0.06 ng/mL — AB (ref <0.031)
Troponin I: 0.08 ng/mL — ABNORMAL HIGH (ref <0.031)
Troponin I: 0.1 ng/mL — ABNORMAL HIGH (ref <0.031)

## 2015-03-09 LAB — RENAL FUNCTION PANEL
ALBUMIN: 2.1 g/dL — AB (ref 3.5–5.0)
Anion gap: 6 (ref 5–15)
BUN: 18 mg/dL (ref 6–20)
CO2: 37 mmol/L — ABNORMAL HIGH (ref 22–32)
CREATININE: 0.47 mg/dL (ref 0.44–1.00)
Calcium: 8.3 mg/dL — ABNORMAL LOW (ref 8.9–10.3)
Chloride: 95 mmol/L — ABNORMAL LOW (ref 101–111)
GFR calc Af Amer: >60 mL/min (ref >60)
Glucose, Bld: 147 mg/dL — ABNORMAL HIGH (ref 65–99)
PHOSPHORUS: 3.4 mg/dL (ref 2.5–4.6)
POTASSIUM: 3.4 mmol/L — AB (ref 3.5–5.1)
Sodium: 138 mmol/L (ref 135–145)

## 2015-03-09 LAB — HEPARIN LEVEL (UNFRACTIONATED)
HEPARIN UNFRACTIONATED: 0.48 [IU]/mL (ref 0.30–0.70)
Heparin Unfractionated: 0.26 IU/mL — ABNORMAL LOW (ref 0.30–0.70)
Heparin Unfractionated: 0.27 IU/mL — ABNORMAL LOW (ref 0.30–0.70)

## 2015-03-09 LAB — MAGNESIUM: MAGNESIUM: 2 mg/dL (ref 1.7–2.4)

## 2015-03-09 MED ORDER — FUROSEMIDE 10 MG/ML IJ SOLN
20.0000 mg | Freq: Two times a day (BID) | INTRAMUSCULAR | Status: DC
  Administered 2015-03-09 – 2015-03-11 (×4): 20 mg via INTRAVENOUS
  Filled 2015-03-09 (×8): qty 2

## 2015-03-09 MED ORDER — HYDRALAZINE HCL 20 MG/ML IJ SOLN
5.0000 mg | Freq: Four times a day (QID) | INTRAMUSCULAR | Status: DC | PRN
  Administered 2015-03-09 – 2015-03-10 (×2): 5 mg via INTRAVENOUS
  Filled 2015-03-09 (×2): qty 1

## 2015-03-09 MED ORDER — MIDAZOLAM HCL 2 MG/2ML IJ SOLN: 2.0000 mg | INTRAMUSCULAR | Status: DC | PRN

## 2015-03-09 MED ORDER — NEBIVOLOL HCL 5 MG PO TABS
5.0000 mg | ORAL_TABLET | Freq: Every day | ORAL | Status: DC
  Administered 2015-03-09: 5 mg via ORAL
  Filled 2015-03-09 (×2): qty 1

## 2015-03-09 MED ORDER — MIDAZOLAM HCL 2 MG/2ML IJ SOLN
INTRAMUSCULAR | Status: AC
  Administered 2015-03-09: 2 mg
  Filled 2015-03-09: qty 2

## 2015-03-09 MED ORDER — LABETALOL HCL 5 MG/ML IV SOLN: 10.0000 mg | INTRAVENOUS | Status: DC | PRN

## 2015-03-09 MED ORDER — AMIODARONE HCL 200 MG PO TABS
400.0000 mg | ORAL_TABLET | Freq: Two times a day (BID) | ORAL | Status: DC
  Administered 2015-03-09 – 2015-03-15 (×13): 400 mg via ORAL
  Filled 2015-03-09 (×13): qty 2

## 2015-03-09 NOTE — Progress Notes (Signed)
ANTICOAGULATION CONSULT NOTE - Follow Up Consult  Pharmacy Consult for Heparin  Indication: atrial fibrillation  Allergies  Allergen Reactions  . Amoxicillin Other (See Comments)    UTI  . Atenolol Cough  . Sulfa Antibiotics Nausea Only  . Codeine Rash    Patient Measurements: Height: 5\' 5"  (165.1 cm) Weight: 141 lb 1.5 oz (64 kg) IBW/kg (Calculated) : 57  Vital Signs: Temp: 99.7 F (37.6 C) (11/24 0407) Temp Source: Axillary (11/24 0407) BP: 170/90 mmHg (11/24 0500) Pulse Rate: 57 (11/24 0400)  Labs:  Recent Labs  03/07/15 0338 03/08/15 0345 03/08/15 2016 03/09/15 0410  HGB 12.2 11.5*  --   --   HCT 39.8 36.2  --   --   PLT 171 166  --   --   HEPARINUNFRC  --   --  0.35 0.26*  CREATININE 0.57 0.36*  --  0.47    Estimated Creatinine Clearance: 59.7 mL/min (by C-G formula based on Cr of 0.47).    Assessment: Sub-therapeutic heparin level, no issues per RN.  Goal of Therapy:  Heparin level 0.3-0.7 units/ml Monitor platelets by anticoagulation protocol: Yes   Plan:  -Increase heparin to 1300 units/hr -1300 HL  Beverly [name] 03/09/2015,5:44 AM

## 2015-03-09 NOTE — Progress Notes (Addendum)
[facility]  Subjective  - Intubated following commands.   Objective 177/89 56 99.7 F (37.6 C) (Axillary) 14 97%  Intake/Output Summary (Last 24 hours) at 03/09/15 0847 Last data filed at 03/09/15 0800  Gross per 24 hour  Intake 4236.98 ml  Output   2680 ml  Net 1556.98 ml    Palpable right DP 2+ Right leg incisions clean and dry without hematomas or drainage Left foot warm with active range of motion   Assessment/Planning: POD # 7 right femoral to below-knee popliteal bypass with composite vein graft Patent graft right LE A fib on IV amiodarone Still agitated with hypertension controlled with  Precedex, underlying hypertrophic cardiomyopathy Pending extubation  Beverly Beverly [name], Beverly Beverly [name] 03/09/2015 8:47 Beverly [name] --  Laboratory Lab Results:  Recent Labs  03/08/15 0345 03/09/15 0410  WBC 11.0* 10.2  HGB 11.5* 11.1*  HCT 36.2 34.5*  PLT 166 217   BMET  Recent Labs  03/08/15 0345 03/09/15 0410  NA 140 138  K 3.9 3.4*  CL 95* 95*  CO2 36* 37*  GLUCOSE 158* 147*  BUN 19 18  CREATININE 0.36* 0.47  CALCIUM 8.7* 8.3*    COAG Lab Results  Component Value Date   INR 1.01 02/27/2015   No results found for: PTT    Agree with above.  Agitated this Beverly [name] on vent Incisions look good Palpable pedal pulse Continue with current management of AFIB and respiratory failure.  Hopefully she can be extubated soon  [name]

## 2015-03-09 NOTE — Progress Notes (Signed)
ANTICOAGULATION CONSULT NOTE - Follow Up Consult  Pharmacy Consult for Heparin  Indication: atrial fibrillation  Allergies  Allergen Reactions  . Amoxicillin Other (See Comments)    UTI  . Atenolol Cough  . Sulfa Antibiotics Nausea Only  . Codeine Rash    Patient Measurements: Height: 5\' 5"  (165.1 cm) Weight: 141 lb 5 oz (64.1 kg) IBW/kg (Calculated) : 57  Vital Signs: Temp: 98.7 F (37.1 C) (11/24 1518) Temp Source: Axillary (11/24 1518) BP: 90/50 mmHg (11/24 1603) Pulse Rate: 62 (11/24 1603)  Labs:  Recent Labs  03/07/15 0338 03/08/15 0345 03/08/15 2016 03/09/15 0410 03/09/15 0540 03/09/15 1045 03/09/15 1316  HGB 12.2 11.5*  --  11.1*  --   --   --   HCT 39.8 36.2  --  34.5*  --   --   --   PLT 171 166  --  217  --   --   --   HEPARINUNFRC  --   --  0.35 0.26*  --   --  0.27*  CREATININE 0.57 0.36*  --  0.47  --   --   --   TROPONINI  --   --   --   --  0.06* 0.08*  --     Estimated Creatinine Clearance: 59.7 mL/min (by C-G formula based on Cr of 0.47).    Assessment: Heparin level 0.27.  Only slight increase despite increase in heparin rat this AM.  Sub-therapeutic heparin level, no issues per RN.   Goal of Therapy:  Heparin level 0.3-0.7 units/ml Monitor platelets by anticoagulation protocol: Yes   Plan:  -Increase heparin to 1150 units/hr 6 hr HL  Noah Delaineuth Clark, RPh Clinical Pharmacist Pager: [phone] 03/09/2015,4:49 PM

## 2015-03-09 NOTE — Progress Notes (Signed)
Received call from nurse that patient extubated herself. When I arrived to the bedside I found patient on a NRB 100% Fio2. Vital signs were as follows; HR 68,RR 23, Sats 100% and BP 155/79. Patient said she does not want to be reintubated, The nurse and I attempted to comfort and reassure the patient. CCM was notified and the patient's husband was called to the bedside. RT will continue to monitor.

## 2015-03-09 NOTE — Progress Notes (Signed)
ANTICOAGULATION CONSULT NOTE - Follow Up Consult  Pharmacy Consult for Heparin  Indication: atrial fibrillation   Correction to today's pharmacy heparin note/PLAN below:   Plan:  -Increase heparin to 1000 units/hr (not 1300 units/hr) Check HL at 1300 today.   Noah Delaineuth Demetrus Pavao, RPh Clinical Pharmacist Pager: [phone] 03/09/2015,11:05 AM

## 2015-03-09 NOTE — Progress Notes (Signed)
eLink Physician-Brief Progress Note Patient Name: [name] DOB: [birth year] MRN: [id number]   Date of Service  03/09/2015  HPI/Events of Note  Hypertensive, C/O chest pain  eICU Interventions  Hydralazine PRN, Check EKG and troponins     Intervention Category Intermediate Interventions: Hypertension - evaluation and management  Alise Calais 03/09/2015, 5:34 AM

## 2015-03-09 NOTE — Progress Notes (Signed)
PULMONARY / CRITICAL CARE MEDICINE   Name: Beverly [name] MRN: [id number] DOB: 1945-05-25    ADMISSION DATE:  03/02/2015 CONSULTATION DATE:  03/02/2015  REFERRING MD :  Dr Edilia Bo [facility]  CHIEF COMPLAINT:  Acute post op resp failure  BRIEF: 69 year old female with baseline claudication but no dyspnea but has some HOCM (followed by Dr Anne Fu) but at baseline and has had a normal cardiac stress test and is able to climb stairs without dyspnea. He underwent on 03/02/2015 right below knee popliteal artery femoropopliteal bypass and femoral artery endarterectomy on the right. Overall operative time was approximately 4 hours according to anesthesia. First 2 hours no problem but subsequently started having severe evidence of hypertrophic obstructive cardiomyopathy as evidenced by intraoperative echo and periods of hypotension. Status post 4-5 liters of fluid and for albumin infusions. Brought into the intensive care unit postoperatively and was spontaneously weaned off Neo-Synephrine but was found to be in respiratory acidosis with severe hypoxemia and chest x-ray with acute pulmonary edema bilaterally. Pulmonary critical care called for consultation 03/02/15  EVENTS: 11/17 - Admit to hospital 11/17 - Right below knee Fem-Pop bypass w/ femoral artery endarterectomy on right 11/18 - Developed fever & antibiotics started for empiric coverage 11/20 - A fib w/ RVR & converted to NSR 11/23 - A fib w/ RVR again started on Amio gtt  STUDIES: TTE 11/19 - EF 65-70% w/ moderate LVH. Grade 2 diastolic dysfunction. Left pleural effusion.  MICROBIOLOGY: Blood Ctx 11/18>>> negative Trach Asp 11/18 >> Oral flora  ANTIBIOTICS: Vancomycin 11/18>>> Levaquin 11/18>>>  SUBJECTIVE:  Remains on amiodarone, precedex.  VITAL SIGNS: Temp:  [97.8 F (36.6 C)-99.7 F (37.6 C)] 99.7 F (37.6 C) (11/24 0407) Pulse Rate:  [54-132] 54 (11/24 0600) Resp:  [12-24] 13 (11/24 0600) BP: (103-170)/(64-96) 153/81 mmHg  (11/24 0600) SpO2:  [69 %-100 %] 96 % (11/24 0600) FiO2 (%):  [40 %] 40 % (11/24 0358) Weight:  [141 lb 5 oz (64.1 kg)] 141 lb 5 oz (64.1 kg) (11/24 0500) HEMODYNAMICS:   VENTILATOR SETTINGS: Vent Mode:  [-] PCV FiO2 (%):  [40 %] 40 % Set Rate:  [14 bmp] 14 bmp PEEP:  [5 cmH20] 5 cmH20 Pressure Support:  [10 cmH20] 10 cmH20 Plateau Pressure:  [10 cmH20-19 cmH20] 19 cmH20 INTAKE / OUTPUT:  Intake/Output Summary (Last 24 hours) at 03/09/15 0655 Last data filed at 03/09/15 5621  Gross per 24 hour  Intake 4628.58 ml  Output   2760 ml  Net 1868.58 ml    PHYSICAL EXAMINATION: General: sedated Neuro: RASS -2 HEENT: Pupils reactive, ETT in place Cardiac: regular Chest: b/l faint crackles Abd: soft, non tender, +BS Ext: no edema Skin: no rashes   PULMONARY  Recent Labs Lab 03/02/15 2011 03/04/15 0338 03/05/15 0349 03/05/15 1120 03/06/15 0342  PHART 7.381 7.482* 7.417 7.341* 7.318*  PCO2ART 40.3 35.8 46.3* 56.5* 64.3*  PO2ART 130.0* 61.0* 63.0* 68.0* 72.2*  HCO3 24.0 26.9* 29.7* 30.5* 32.1*  TCO2 32 34.0  O2SAT 99.0 93.0 91.0 92.0 93.8    CBC  Recent Labs Lab 03/07/15 0338 03/08/15 0345 03/09/15 0410  HGB 12.2 11.5* 11.1*  HCT 39.8 36.2 34.5*  WBC 12.1* 11.0* 10.2  PLT 171 166 217   CARDIAC    Recent Labs Lab 03/02/15 1055 03/02/15 1730 03/02/15 2304 03/09/15 0540  TROPONINI 0.03 0.04* 0.06* 0.06*    CHEMISTRY  Recent Labs Lab 03/04/15 0355 03/05/15 0414 03/06/15 0351 03/06/15 1105 03/07/15 0338 03/08/15 0345 03/09/15  0410  NA 136 136 135  --  140 140 138  K 3.6 3.6 4.7  --  4.8 3.9 3.4*  CL 104 104 97*  --  96* 95* 95*  CO2 27 26 34*  --  37* 36* 37*  GLUCOSE 114* 138* 122*  --  158* 158* 147*  BUN 15 16 22*  --  26* 19 18  CREATININE 0.60 0.59 0.57  --  0.57 0.36* 0.47  CALCIUM 8.0* 7.2* 8.3*  --  9.0 8.7* 8.3*  MG 2.1 1.9  --   --  2.2 1.8 2.0  PHOS 1.4* 1.6*  --  2.9 3.3 2.0* 3.4   Estimated Creatinine Clearance: 59.7  mL/min (by C-G formula based on Cr of 0.47).   LIVER  Recent Labs Lab 03/07/15 0338 03/08/15 0345 03/09/15 0410  ALBUMIN 2.6* 2.2* 2.1*     INFECTIOUS  Recent Labs Lab 03/03/15 0850 03/04/15 0355 03/05/15 0414  LATICACIDVEN 1.3  --   --   PROCALCITON <0.10 0.28 0.24     ENDOCRINE CBG (last 3)   Recent Labs  03/08/15 1936 03/09/15 0006 03/09/15 0405  GLUCAP 145* 118* 137*     IMAGING x48h Dg Chest Port 1 View  03/08/2015  CLINICAL DATA:  NG tube placement EXAM: PORTABLE CHEST 1 VIEW COMPARISON:  03/06/2015 FINDINGS: Cardiomegaly. Central vascular congestion without convincing pulmonary edema. Hazy infiltrate in right lower lobe suspicious for pneumonia. Endotracheal and NG tube in place. Stable left IJ central line position. IMPRESSION: No convincing pulmonary edema. Central vascular congestion. Hazy infiltrate in right lower lobe suspicious for pneumonia. Electronically Signed   By: Natasha Mead M.D.   On: 03/08/2015 10:55   Dg Abd Portable 1v  03/08/2015  CLINICAL DATA:  NG tube placement EXAM: PORTABLE ABDOMEN - 1 VIEW COMPARISON:  12/28/2013 FINDINGS: Nasogastric tube passes well below the diaphragm. Tip lies in the distal stomach. Stomach is moderately distended with air. No evidence of bowel obstruction on the included field of view. IMPRESSION: NG tube tip projects in the distal stomach. Electronically Signed   By: Amie Portland M.D.   On: 03/08/2015 10:55     ASSESSMENT / PLAN:  PULMONARY ETT 11/17 >> A:  Acute Hypoxic Respiratory Failure -  Secondary to acute pulmonary edema and HCAP. Slowly weaning. Wheezing w/ Long h/o Tobacco Use - Probable COPD with exacerbation. Chronic Hypercarbic Respiratory Failure. P:   Changed to pressure control 11/20 Continuing PS weaning Duoneb q6hr Pulmicort 0.5mg  neb bid F/u CXR  CARDIOVASCULAR Lt IJ CVL 11/17 >> A:  Hypertrophic Cardiomyopathy with Cardiogenic Shock - Shock resolved. Complicated by sedative  effects. A fib with RVR - Recurred AM 11/23. Previously resolved on 11/20. S/P Right Fem-Pop Bypass. H/O HTN & HLD. P:  Cardiology following Amiodarone gtt w/ improving rate control Continue heparin gtt Avoiding tachycardia/hypovolemia Post-op care per VVS ASA  via tube daily Crestor  via tube qhs Hold outpt norvasc, cozaar, bystolic Diuresis per Cardiology recommendations to maintain euvolemia  RENAL A:   Hypophosphatemia - Mild. Holding on replacement. Hypomagnesemia - Mild. Replacing IV given Atrial fibrillation. Hypokalemia - Resolved.  P:   Monitoring electrolytes daily  GASTROINTESTINAL A:  Nutrition >> increased gastric residuals 11/24. Constipation. P:   Continuing tube feeds as tolerated Protonix via tube daily Senokot, fleet enema, dulcolax  HEMATOLOGIC A:   Leukocytosis >> resolved. P:  F/u CBC  INFECTIOUS A:  HCAP. H/O PCN allergy. P:   Day 7/7 Vancomycin & Levaquin D/d Abx after  dose on 11/24  ENDOCRINE A:   Hyperglycemia - On tube feedings. No h/o DM. P:   SSI  NEUROLOGIC A:  Delirium. H/O Insomnia, Anxiety. P:   RASS goal: 0  Wellbutrin 100mg  via tube bid Continuing Precedex gtt to spare Fentanyl  Fentanyl IV prn breakthrough pain Holding Versed given hypotension Holding outpt ultram & ambien for now Checking EKG to assess QTc & see if Haldol may be used  CC time 32 minutes.  Coralyn HellingVineet Shirrell Solinger, MD [facility] 03/09/2015, 7:01 AM Pager:  [phone] After 3pm call: [phone]

## 2015-03-09 NOTE — Progress Notes (Addendum)
    Subjective:  Agitated but awake and following commands this morning. Wants ET tube out.  Objective:  Vital Signs in the last 24 hours: Temp:  [97.8 F (36.6 C)-99.7 F (37.6 C)] 99.7 F (37.6 C) (11/24 0407) Pulse Rate:  [54-132] 56 (11/24 0733) Resp:  [12-24] 14 (11/24 0733) BP: (104-177)/(64-91) 177/89 mmHg (11/24 0800) SpO2:  [69 %-100 %] 97 % (11/24 0733) FiO2 (%):  [40 %] 40 % (11/24 0733) Weight:  [141 lb 5 oz (64.1 kg)] 141 lb 5 oz (64.1 kg) (11/24 0500)  Intake/Output from previous day: 11/23 0701 - 11/24 0700 In: [id number] [I.V.:2062; NG/GT:1390; IV Piggyback:1150] Out: 2660 [Urine:2660]  Physical Exam: Pt is awake and alert, on ventilator HEENT: normal Neck: JVP - normal Lungs: coarse bilaterally with diffuse rhonchi CV: RRR with 3/6 harsh systolic murmur at the LSB Abd: soft, NT, Positive BS Ext: no C/C/E Skin: warm/dry no rash  Lab Results:  Recent Labs  03/08/15 0345 03/09/15 0410  WBC 11.0* 10.2  HGB 11.5* 11.1*  PLT 166 217    Recent Labs  03/08/15 0345 03/09/15 0410  NA 140 138  K 3.9 3.4*  CL 95* 95*  CO2 36* 37*  GLUCOSE 158* 147*  BUN 19 18  CREATININE 0.36* 0.47    Recent Labs  03/09/15 0540  TROPONINI 0.06*   Tele: Sinus rhythm, sinus brady with sleep  Assessment/Plan:  1. Atrial fibrillation with RVR, converted to sinus yesterday afternoon on IV amiodarone. Plan change to PO (per tube) amiodarone today 400 mg BID. Continue IV heparin. 2. Acute on chronic diastolic CHF with underlying hypertrophic cardiomyopathy. Fluid balance remains positive, increase  IV lasix to 20 mg twice daily to maintain even fluid balance, add back low-dose beta-blocker today.  Otherwise as per CCM team. Will continue to follow.  Tonny Bollmanooper, Aliza Moret, M.D. 03/09/2015, 8:23 AM

## 2015-03-09 NOTE — Progress Notes (Signed)
eLink Physician-Brief Progress Note Patient Name: Beverly MeekerFrances B Lasure DOB: [birth year] MRN: [id number]   Date of Service  03/09/2015  HPI/Events of Note   notified by RN that patient   eICU Interventions  -patient evaluated through camera vitals reviewed. -O2 sats currently approximately 95% on 4-5 L nasal cannula. The patient is conversational though her throat is somewhat raspy. She reaffirms that she would be okay with having the tube being put back in if necessary. -Currently the patient is stable. Continue to monitor overnight, and reintubation can be performed if necessary, though at present. Her respiratory status appears to be compensated. -Blood gas in 2 hours         Daquana Paddock 03/09/2015, 10:51 PM

## 2015-03-10 ENCOUNTER — Inpatient Hospital Stay (HOSPITAL_COMMUNITY): Payer: Medicare Other

## 2015-03-10 DIAGNOSIS — E876 Hypokalemia: Secondary | ICD-10-CM | POA: Insufficient documentation

## 2015-03-10 DIAGNOSIS — J189 Pneumonia, unspecified organism: Secondary | ICD-10-CM | POA: Insufficient documentation

## 2015-03-10 DIAGNOSIS — Z95828 Presence of other vascular implants and grafts: Secondary | ICD-10-CM | POA: Insufficient documentation

## 2015-03-10 LAB — CBC
HCT: 36.8 % (ref 36.0–46.0)
Hemoglobin: 11.7 g/dL — ABNORMAL LOW (ref 12.0–15.0)
MCH: 29.3 pg (ref 26.0–34.0)
MCHC: 31.8 g/dL (ref 30.0–36.0)
MCV: 92.2 fL (ref 78.0–100.0)
PLATELETS: 285 10*3/uL (ref 150–400)
RBC: 3.99 MIL/uL (ref 3.87–5.11)
RDW: 14.1 % (ref 11.5–15.5)
WBC: 12.4 10*3/uL — AB (ref 4.0–10.5)

## 2015-03-10 LAB — GLUCOSE, CAPILLARY
GLUCOSE-CAPILLARY: 88 mg/dL (ref 65–99)
GLUCOSE-CAPILLARY: 91 mg/dL (ref 65–99)
GLUCOSE-CAPILLARY: 97 mg/dL (ref 65–99)
Glucose-Capillary: 89 mg/dL (ref 65–99)

## 2015-03-10 LAB — BLOOD GAS, ARTERIAL
Acid-Base Excess: 9.9 mmol/L — ABNORMAL HIGH (ref 0.0–2.0)
BICARBONATE: 33.5 meq/L — AB (ref 20.0–24.0)
O2 CONTENT: 5 L/min
O2 SAT: 92.2 %
PATIENT TEMPERATURE: 98.6
TCO2: 34.7 mmol/L (ref 0–100)
pCO2 arterial: 40.9 mmHg (ref 35.0–45.0)
pH, Arterial: 7.524 — ABNORMAL HIGH (ref 7.350–7.450)
pO2, Arterial: 61.2 mmHg — ABNORMAL LOW (ref 80.0–100.0)

## 2015-03-10 LAB — BASIC METABOLIC PANEL
ANION GAP: 5 (ref 5–15)
ANION GAP: 9 (ref 5–15)
BUN: 19 mg/dL (ref 6–20)
BUN: 19 mg/dL (ref 6–20)
CALCIUM: 8.5 mg/dL — AB (ref 8.9–10.3)
CO2: 36 mmol/L — AB (ref 22–32)
CO2: 35 mmol/L — AB (ref 22–32)
CREATININE: 0.5 mg/dL (ref 0.44–1.00)
Calcium: 8.3 mg/dL — ABNORMAL LOW (ref 8.9–10.3)
Chloride: 98 mmol/L — ABNORMAL LOW (ref 101–111)
Chloride: 96 mmol/L — ABNORMAL LOW (ref 101–111)
Creatinine, Ser: 0.51 mg/dL (ref 0.44–1.00)
GFR calc Af Amer: >60 mL/min (ref >60)
GFR calc Af Amer: >60 mL/min (ref >60)
GFR calc non Af Amer: >60 mL/min (ref >60)
GLUCOSE: 96 mg/dL (ref 65–99)
GLUCOSE: 103 mg/dL — AB (ref 65–99)
POTASSIUM: 2.9 mmol/L — AB (ref 3.5–5.1)
Potassium: 3.5 mmol/L (ref 3.5–5.1)
Sodium: 139 mmol/L (ref 135–145)
Sodium: 140 mmol/L (ref 135–145)

## 2015-03-10 LAB — MAGNESIUM: Magnesium: 2.1 mg/dL (ref 1.7–2.4)

## 2015-03-10 LAB — HEMOGLOBIN A1C
HEMOGLOBIN A1C: 6.3 % — AB (ref 4.8–5.6)
MEAN PLASMA GLUCOSE: 134 mg/dL

## 2015-03-10 LAB — HEPARIN LEVEL (UNFRACTIONATED): HEPARIN UNFRACTIONATED: 0.49 [IU]/mL (ref 0.30–0.70)

## 2015-03-10 MED ORDER — BUPROPION HCL ER (SR) 150 MG PO TB12
150.0000 mg | ORAL_TABLET | Freq: Two times a day (BID) | ORAL | Status: DC
  Administered 2015-03-10 – 2015-03-15 (×11): 150 mg via ORAL
  Filled 2015-03-10 (×12): qty 1

## 2015-03-10 MED ORDER — POTASSIUM CHLORIDE 10 MEQ/50ML IV SOLN
10.0000 meq | INTRAVENOUS | Status: AC
  Administered 2015-03-10 (×2): 10 meq via INTRAVENOUS
  Filled 2015-03-10: qty 50

## 2015-03-10 MED ORDER — NEBIVOLOL HCL 10 MG PO TABS
10.0000 mg | ORAL_TABLET | Freq: Every day | ORAL | Status: DC
  Administered 2015-03-10 – 2015-03-15 (×6): 10 mg via ORAL
  Filled 2015-03-10 (×6): qty 1

## 2015-03-10 MED ORDER — INSULIN ASPART 100 UNIT/ML ~~LOC~~ SOLN: 0.0000 [IU] | Freq: Three times a day (TID) | SUBCUTANEOUS | Status: DC

## 2015-03-10 MED ORDER — ASPIRIN EC 325 MG PO TBEC
325.0000 mg | DELAYED_RELEASE_TABLET | Freq: Every day | ORAL | Status: DC
  Administered 2015-03-10 – 2015-03-15 (×6): 325 mg via ORAL
  Filled 2015-03-10 (×6): qty 1

## 2015-03-10 MED ORDER — AMLODIPINE BESYLATE 5 MG PO TABS
5.0000 mg | ORAL_TABLET | Freq: Every day | ORAL | Status: DC
  Administered 2015-03-10 – 2015-03-15 (×6): 5 mg via ORAL
  Filled 2015-03-10 (×6): qty 1

## 2015-03-10 MED ORDER — PANTOPRAZOLE SODIUM 40 MG PO TBEC
40.0000 mg | DELAYED_RELEASE_TABLET | Freq: Every day | ORAL | Status: DC
  Administered 2015-03-10 – 2015-03-15 (×6): 40 mg via ORAL
  Filled 2015-03-10 (×6): qty 1

## 2015-03-10 MED ORDER — POTASSIUM CHLORIDE CRYS ER 20 MEQ PO TBCR: 40.0000 meq | EXTENDED_RELEASE_TABLET | ORAL | Status: DC

## 2015-03-10 MED ORDER — INSULIN ASPART 100 UNIT/ML ~~LOC~~ SOLN: 0.0000 [IU] | Freq: Every day | SUBCUTANEOUS | Status: DC

## 2015-03-10 MED ORDER — TRAMADOL HCL 50 MG PO TABS
50.0000 mg | ORAL_TABLET | Freq: Four times a day (QID) | ORAL | Status: DC | PRN
  Administered 2015-03-11 – 2015-03-13 (×3): 50 mg via ORAL
  Filled 2015-03-10 (×3): qty 1

## 2015-03-10 MED ORDER — POTASSIUM CHLORIDE 10 MEQ/50ML IV SOLN
10.0000 meq | INTRAVENOUS | Status: AC
  Administered 2015-03-10 (×6): 10 meq via INTRAVENOUS
  Filled 2015-03-10 (×6): qty 50

## 2015-03-10 MED ORDER — SENNOSIDES-DOCUSATE SODIUM 8.6-50 MG PO TABS
1.0000 | ORAL_TABLET | Freq: Two times a day (BID) | ORAL | Status: DC
  Administered 2015-03-10 – 2015-03-15 (×6): 1 via ORAL
  Filled 2015-03-10 (×12): qty 1

## 2015-03-10 MED ORDER — CETYLPYRIDINIUM CHLORIDE 0.05 % MT LIQD
7.0000 mL | Freq: Two times a day (BID) | OROMUCOSAL | Status: DC
  Administered 2015-03-10 – 2015-03-15 (×9): 7 mL via OROMUCOSAL

## 2015-03-10 MED ORDER — ACETAMINOPHEN 325 MG PO TABS
650.0000 mg | ORAL_TABLET | Freq: Four times a day (QID) | ORAL | Status: DC | PRN
  Administered 2015-03-14: 650 mg via ORAL
  Filled 2015-03-10: qty 2

## 2015-03-10 NOTE — Progress Notes (Addendum)
While sitting outside room pt was saw by this nurse to have her head bent significantly down. Upon entering the room pt was seen to have her restrained hand on distal end of ETT tube and OG tube and upon inspecting pt had significantly removed them from prior position. Cuff was subsequently deflated and ETT removed. RT and Elink was immediately notified and pt was placed on 15 liters of oxygen via Non Re breather. O2 sats and HR stable throughout. Family was immediately called and informed. Pt orientated to person and time but disoriented to place, [place] Elink informed to continue current plan and draw ABG at 0100. This nurse will continue to monitor closely. Modena JanskyKevin Sierra Spargo RN [facility]

## 2015-03-10 NOTE — Progress Notes (Signed)
PULMONARY / CRITICAL CARE MEDICINE   Name: Beverly Li MRN: [id number] DOB: Sep 03, 1945    ADMISSION DATE:  03/02/2015 CONSULTATION DATE:  03/02/2015  REFERRING MD :  Dr Edilia Bo [facility]  CHIEF COMPLAINT:  Acute post op resp failure  BRIEF: 69 year old female with baseline claudication but no dyspnea but has some HOCM (followed by Dr Anne Fu) but at baseline and has had a normal cardiac stress test and is able to climb stairs without dyspnea. He underwent on 03/02/2015 right below knee popliteal artery femoropopliteal bypass and femoral artery endarterectomy on the right. Overall operative time was approximately 4 hours according to anesthesia. First 2 hours no problem but subsequently started having severe evidence of hypertrophic obstructive cardiomyopathy as evidenced by intraoperative echo and periods of hypotension. Status post 4-5 liters of fluid and for albumin infusions. Brought into the intensive care unit postoperatively and was spontaneously weaned off Neo-Synephrine but was found to be in respiratory acidosis with severe hypoxemia and chest x-ray with acute pulmonary edema bilaterally. Pulmonary critical care called for consultation 03/02/15  EVENTS: 11/17 - Admit to hospital 11/17 - Right below knee Fem-Pop bypass w/ femoral artery endarterectomy on right 11/18 - Developed fever & antibiotics started for empiric coverage 11/20 - A fib w/ RVR & converted to NSR 11/23 - A fib w/ RVR again started on Amio gtt 11/24 - Converted back to NSR & Self-extubated.  STUDIES: TTE 11/19 - EF 65-70% w/ moderate LVH. Grade 2 diastolic dysfunction. Left pleural effusion. EKG 11/24 - Sinus brady. QTc . Port CXR 11/25 - Bilateral lower lung opacities unchanged.  MICROBIOLOGY: Blood Ctx 11/18 - Negative Trach Asp 11/18 - Oral flora  ANTIBIOTICS: Vancomycin 11/18 - 11/24 Levaquin 11/18 - 11/24  SUBJECTIVE: Patient self-extubated overnight. Denies any dyspnea on nasal cannula oxygen.  Does have some mild chest discomfort but otherwise no frank pain. Intermittent coughing.  REVEW OF SYSTEMS: Patient denies any nausea or vomiting. She denies any chills. She is having flatulence with coughing.  VITAL SIGNS: Temp:  [98.5 F (36.9 C)-99.9 F (37.7 C)] 98.6 F (37 C) (11/25 0337) Pulse Rate:  [56-121] 66 (11/25 0800) Resp:  [13-29] 14 (11/25 0800) BP: (58-180)/(39-158) 152/77 mmHg (11/25 0800) SpO2:  [85 %-100 %] 95 % (11/25 0800) FiO2 (%):  [40 %] 40 % (11/24 1922) Weight:  [140 lb 10.5 oz (63.8 kg)] 140 lb 10.5 oz (63.8 kg) (11/25 0500) HEMODYNAMICS:   VENTILATOR SETTINGS: Vent Mode:  [-] PCV FiO2 (%):  [40 %] 40 % Set Rate:  [14 bmp] 14 bmp PEEP:  [5 cmH20] 5 cmH20 Plateau Pressure:  [14 cmH20-19 cmH20] 14 cmH20 INTAKE / OUTPUT:  Intake/Output Summary (Last 24 hours) at 03/10/15 0844 Last data filed at 03/10/15 0810  Gross per 24 hour  Intake 2756.33 ml  Output   4535 ml  Net -1778.67 ml    PHYSICAL EXAMINATION: General:  Awake. Alert. No acute distress. Husband at bedside..  Integument:  Warm & dry. No rash on exposed skin. Left IJ in place. HEENT:  No scleral injection or icterus. PERRL. Cardiovascular:  Regular rate & NSR on telemetry. No edema. No appreciable JVD.  Pulmonary:  Coarse breath sounds bilaterally. Symmetric chest wall expansion. Normal work of breathing on nasal cannula oxygen. Abdomen: Soft. Hypoactive bowel sounds. Nondistended.  Neurological:  Grossly nonfocal. Oriented to person, place, time, & situation. CN grossly in tact.  PULMONARY  Recent Labs Lab 03/04/15 0338 03/05/15 0349 03/05/15 1120 03/06/15 0342 03/10/15 0039  PHART  7.482* 7.417 7.341* 7.318* 7.524*  PCO2ART 35.8 46.3* 56.5* 64.3* 40.9  PO2ART 61.0* 63.0* 68.0* 72.2* 61.2*  HCO3 26.9* 29.7* 30.5* 32.1* 33.5*  TCO2 28 31 32 34.0 34.7  O2SAT 93.0 91.0 92.0 93.8 92.2    CBC  Recent Labs Lab 03/08/15 0345 03/09/15 0410 03/10/15 0404  HGB 11.5* 11.1* 11.7*   HCT 36.2 34.5* 36.8  WBC 11.0* 10.2 12.4*  PLT 166 217 285   CARDIAC    Recent Labs Lab 03/09/15 0540 03/09/15 1045 03/09/15 1807  TROPONINI 0.06* 0.08* 0.10*    CHEMISTRY  Recent Labs Lab 03/04/15 0355 03/05/15 0414 03/06/15 0351 03/06/15 1105 03/07/15 0338 03/08/15 0345 03/09/15 0410 03/10/15 0404  NA 136 136 135  --  140 140 138 139  K 3.6 3.6 4.7  --  4.8 3.9 3.4* 2.9*  CL 104 104 97*  --  96* 95* 95* 98*  CO2 27 26 34*  --  37* 36* 37* 36*  GLUCOSE 114* 138* 122*  --  158* 158* 147* 96  BUN 15 16 22*  --  26* CREATININE 0.60 0.59 0.57  --  0.57 0.36* 0.47 0.51  CALCIUM 8.0* 7.2* 8.3*  --  9.0 8.7* 8.3* 8.3*  MG 2.1 1.9  --   --  2.2 1.8 2.0  --   PHOS 1.4* 1.6*  --  2.9 3.3 2.0* 3.4  --    Estimated Creatinine Clearance: 59.7 mL/min (by C-G formula based on Cr of 0.51).   LIVER  Recent Labs Lab 03/07/15 0338 03/08/15 0345 03/09/15 0410  ALBUMIN 2.6* 2.2* 2.1*     INFECTIOUS  Recent Labs Lab 03/03/15 0850 03/04/15 0355 03/05/15 0414  LATICACIDVEN 1.3  --   --   PROCALCITON <0.10 0.28 0.24     ENDOCRINE CBG (last 3)   Recent Labs  03/09/15 1948 03/09/15 2334 03/10/15 0339  GLUCAP 150* 150* 88     IMAGING x48h Dg Chest Port 1 View  03/10/2015  CLINICAL DATA:  Health care associated pneumonia. EXAM: PORTABLE CHEST 1 VIEW COMPARISON:  March 08, 2015. FINDINGS: Stable cardiomegaly. No pneumothorax is noted. Endotracheal and nasogastric tubes have been removed. Left internal jugular catheter is stable with distal tip in expected position of the SVC. Stable calcified granuloma is noted in right upper lobe. Stable bibasilar densities are noted, with right greater than left, consistent with edema or pneumonia with probable associated pleural effusion. Bony thorax is unremarkable. IMPRESSION: Stable bibasilar opacity is noted, right greater than left, concerning for edema or pneumonia with associated pleural effusions.  Electronically Signed   By: Lupita Raider, M.D.   On: 03/10/2015 07:36   Dg Chest Port 1 View  03/08/2015  CLINICAL DATA:  NG tube placement EXAM: PORTABLE CHEST 1 VIEW COMPARISON:  03/06/2015 FINDINGS: Cardiomegaly. Central vascular congestion without convincing pulmonary edema. Hazy infiltrate in right lower lobe suspicious for pneumonia. Endotracheal and NG tube in place. Stable left IJ central line position. IMPRESSION: No convincing pulmonary edema. Central vascular congestion. Hazy infiltrate in right lower lobe suspicious for pneumonia. Electronically Signed   By: Natasha Mead M.D.   On: 03/08/2015 10:55   Dg Abd Portable 1v  03/08/2015  CLINICAL DATA:  NG tube placement EXAM: PORTABLE ABDOMEN - 1 VIEW COMPARISON:  12/28/2013 FINDINGS: Nasogastric tube passes well below the diaphragm. Tip lies in the distal stomach. Stomach is moderately distended with air. No evidence of bowel obstruction on the included field of view. IMPRESSION:  NG tube tip projects in the distal stomach. Electronically Signed   By: Amie Portlandavid  Ormond M.D.   On: 03/08/2015 10:55     ASSESSMENT / PLAN:  PULMONARY ETT 11/17 - 11/24 (self-extubated) A:  Acute Hypoxic Respiratory Failure -  Secondary to acute pulmonary edema and HCAP. Self Extubated 10/24 @ 10pm. Wheezing w/ Long h/o Tobacco Use - Probable COPD with exacerbation. Chronic Hypercarbic Respiratory Failure  P:   Continue nasal cannula oxygen to maintain FiO2 >94%. Duoneb q6hr Pulmicort 0.5mg  neb bid Intermittent CXR  CARDIOVASCULAR Lt IJ CVL 11/17 >> A:  Hypertrophic Cardiomyopathy with Cardiogenic Shock - Shock resolved. Complicated by sedative effects. A fib with RVR - Recurred AM 11/23 & converted back to NS on Amio gtt. Previously resolved on 11/20. S/P Right Fem-Pop Bypass. H/O HTN & HLD.  P:  Cardiology following Amiodarone bid Continue heparin gtt Avoiding tachycardia/hypovolemia Post-op care per VVS ASA 324mg  via tube daily Crestor 5mg   via tube qhs Lasix IV q12hr Bystolic 5mg  daily Norvasc 5mg  daily Hold outpt cozaar  RENAL A:   Hypokalemia - Replacing IV. Hypophosphatemia - Resolved. Hypomagnesemia - Resolved.  P:   Trending UOP with foley catheter Monitoring renal function with daily BUN/Creatinine Monitoring electrolytes q12hr on Lasix  GASTROINTESTINAL A:  Nutrition - increased gastric residuals 11/24. Constipation - Enema 11/24 unsuccessful.   P:   Clear liquid diet as tolerated Protonix PO. Sennakot bid Dulcolax prn  HEMATOLOGIC A:   Leukocytosis - Previously resolved. Now mild. Anemia - Mild. No signs of active bleeding.  P:  Trending Hgb & Leukocytosis w/ daily CBC D/C SCDs Heparin gtt per pharmacy protocol  INFECTIOUS A:  HCAP. H/O PCN allergy.  P:   Completed 7 day course of Levaquin & Vancomycin Plan to re-culture for any fever Trending mild leukocytosis  ENDOCRINE A:   Hyperglycemia - On tube feedings. No h/o DM.  P:   Changing accuchecks to qAC & HS Low dose SSI coverage  NEUROLOGIC A:  Delirium. H/O Insomnia & Anxiety.  P:   RASS goal: 0  Wellbutrin 150mg  bid Ultram prn pain D/C Precedex, Versed, & Fentanyl Holding outpt Ambien for now  FAMILY: Husband at bedside this morning (11/25) and updated at length.  TODAY'S SUMMARY: 69 year old female status post femoropopliteal bypass. Now in NSR after starting amiodarone drip. Self-extubated overnight & tolerating [place] at 6 L/m. Instructed on technique with IS. Consulting PT & OOB if ok with vascular surgery. Continuing Lasix diuresis & monitoring electrolytes q12hr.  I have spent a total of 39 minute of critical care time today caring for the patient, updating her husband at bedside, & reviewing the patient's electronic medical record.  Donna ChristenJennings E. Jamison NeighborNestor, M.D. [facility] Pager:  [phone] After 3pm or if no response, call [phone]  03/10/2015, 8:44 AM

## 2015-03-10 NOTE — Progress Notes (Signed)
eLink Physician-Brief Progress Note Patient Name: Beverly Li DOB: [birth year] MRN: [id number]   Date of Service  03/10/2015  HPI/Events of Note  Hypokalemia  eICU Interventions  Potassium replaced     Intervention Category Intermediate Interventions: Electrolyte abnormality - evaluation and management  Mishka Stegemann 03/10/2015, 5:35 AM

## 2015-03-10 NOTE — Progress Notes (Signed)
ANTICOAGULATION CONSULT NOTE - Follow Up Consult  Pharmacy Consult for Heparin  Indication: atrial fibrillation   Patient Measurements: Height: 5\' 5"  (165.1 cm) Weight: 140 lb 10.5 oz (63.8 kg) IBW/kg (Calculated) : 57  Vital Signs: Temp: 98.3 F (36.8 C) (11/25 0800) Temp Source: Oral (11/25 0800) BP: 150/97 mmHg (11/25 0900) Pulse Rate: 66 (11/25 0900)  Labs:  Recent Labs  03/08/15 0345  03/09/15 0410 03/09/15 0540 03/09/15 1045 03/09/15 1316 03/09/15 1807 03/09/15 2308 03/10/15 0404 03/10/15 0544  HGB 11.5*  --  11.1*  --   --   --   --   --  11.7*  --   HCT 36.2  --  34.5*  --   --   --   --   --  36.8  --   PLT 166  --  217  --   --   --   --   --  285  --   HEPARINUNFRC  --   < > 0.26*  --   --  0.27*  --  0.48  --  0.49  CREATININE 0.36*  --  0.47  --   --   --   --   --  0.51  --   TROPONINI  --   --   --  0.06* 0.08*  --  0.10*  --   --   --   < > = values in this interval not displayed.  Estimated Creatinine Clearance: 59.7 mL/min (by C-G formula based on Cr of 0.51).  Assessment:  AC: s/p R fem-pop bypass on 11/17. 11/23 Developed acute post-op respiratory failure. AFib with RVR; IV heparin started on 11/23. Converted to SR 11/23 pm. Currently in sinus on po amio. Hgb 11.7 stable, pltc 285K   Plan to continue heparin for another day then [facility]  Goal of Therapy:  Heparin level 0.3-0.7 units/ml Monitor platelets by anticoagulation protocol: Yes   Plan:  -Continue heparin at 1150 units/hr -HL with AM labs  Sheppard CoilFrank Jveon Pound PharmD., BCPS Clinical Pharmacist Pager [phone] 03/10/2015 10:02 AM

## 2015-03-10 NOTE — Progress Notes (Addendum)
[facility]  Subjective  - Alert and oriented, extubated by self last night.   Objective 132/72 63 98.3 F (36.8 C) (Oral) 15 97%  Intake/Output Summary (Last 24 hours) at 03/10/15 1048 Last data filed at 03/10/15 1010  Gross per 24 hour  Intake 2546.13 ml  Output   5585 ml  Net -3038.87 ml    Palpable DP bilaterally  right leg incisions clean dry without hematoma Heart RRR Bronchial coarse coughing, non labored breathing  Assessment/Planning: POD # 8 right femoral to below-knee popliteal bypass with composite vein graft   Afib addressed by cardiology " with RVR: Patient is maintaining sinus rhythm. Will continue amiodarone 400 mg twice daily. Will increase nebivolol back to her baseline dose. The patient has had atrial fibrillation in the setting of critical illness. She has had 2 recurrences now. Considering her high CHADS-Vasc score, I would be in favor of continuing heparin another 24 hours, then transitioning to a NOAC drug tomorrow with plans to continue at least for one month.  HCM with acute on chronic diastolic heart failure. Would continue IV furosemide cautiously in the setting of HCM. Blood pressure is elevated today and will begin to add back some of her home antihypertensive therapy."  PT has been ordered, she is tolerating small amounts of clears, and asking about discharge planning.    [name], EMMA [facility] 03/10/2015 10:48 AM --  Laboratory Lab Results:  Recent Labs  03/09/15 0410 03/10/15 0404  WBC 10.2 12.4*  HGB 11.1* 11.7*  HCT 34.5* 36.8  PLT 217 285   BMET  Recent Labs  03/09/15 0410 03/10/15 0404  NA 138 139  K 3.4* 2.9*  CL 95* 98*  CO2 37* 36*  GLUCOSE 147* 96  BUN 18 19  CREATININE 0.47 0.51  CALCIUM 8.3* 8.3*    COAG Lab Results  Component Value Date   INR 1.01 02/27/2015   No results found for: PTT    Agree with the above.  Now extubated.  Feet perfused.  Continue with supportive  care   Durene CalWells Cypress Hinkson

## 2015-03-10 NOTE — Progress Notes (Signed)
ANTICOAGULATION CONSULT NOTE - Follow Up Consult  Pharmacy Consult for Heparin  Indication: atrial fibrillation  Allergies  Allergen Reactions  . Amoxicillin Other (See Comments)    UTI  . Atenolol Cough  . Sulfa Antibiotics Nausea Only  . Codeine Rash    Patient Measurements: Height: 5\' 5"  (165.1 cm) Weight: 141 lb 5 oz (64.1 kg) IBW/kg (Calculated) : 57  Vital Signs: Temp: 98.5 F (36.9 C) (11/24 2332) Temp Source: Oral (11/24 2332) BP: 137/68 mmHg (11/24 2330) Pulse Rate: 65 (11/24 2330)  Labs:  Recent Labs  03/07/15 0338 03/08/15 0345  03/09/15 0410 03/09/15 0540 03/09/15 1045 03/09/15 1316 03/09/15 1807 03/09/15 2308  HGB 12.2 11.5*  --  11.1*  --   --   --   --   --   HCT 39.8 36.2  --  34.5*  --   --   --   --   --   PLT 171 166  --  217  --   --   --   --   --   HEPARINUNFRC  --   --   < > 0.26*  --   --  0.27*  --  0.48  CREATININE 0.57 0.36*  --  0.47  --   --   --   --   --   TROPONINI  --   --   --   --  0.06* 0.08*  --  0.10*  --   < > = values in this interval not displayed.  Estimated Creatinine Clearance: 59.7 mL/min (by C-G formula based on Cr of 0.47).    Assessment: Therapeutic heparin level x 1 after rate increase  Goal of Therapy:  Heparin level 0.3-0.7 units/ml Monitor platelets by anticoagulation protocol: Yes   Plan:  -Continue heparin at 1150 units/hr -HL with AM labs  Abran DukeLedford, Telma Pyeatt 03/10/2015,12:02 AM

## 2015-03-10 NOTE — Progress Notes (Signed)
    Subjective:  Shortness of breath with conversation. No chest pain or other complaints. Note she self extubated last night. Complains of a cough this morning.  Objective:  Vital Signs in the last 24 hours: Temp:  [98.5 F (36.9 C)-99.9 F (37.7 C)] 98.6 F (37 C) (11/25 0337) Pulse Rate:  [56-121] 66 (11/25 0800) Resp:  [13-22] 14 (11/25 0800) BP: (58-180)/(39-158) 152/77 mmHg (11/25 0800) SpO2:  [85 %-100 %] 95 % (11/25 0800) FiO2 (%):  [40 %] 40 % (11/24 1922) Weight:  [140 lb 10.5 oz (63.8 kg)] 140 lb 10.5 oz (63.8 kg) (11/25 0500)  Intake/Output from previous day: 11/24 0701 - 11/25 0700 In: 2901.4 [I.V.:1041.4; NG/GT:1310; IV Piggyback:550] Out: 4655 [Urine:4385; Emesis/NG output:270]  Physical Exam: Pt is alert and oriented, NAD HEENT: normal Neck: JVP - normal Lungs: Coarse breath sounds bilaterally CV: RRR 3/6 harsh systolic murmur at the left sternal border Abd: soft, NT, Positive BS, no hepatomegaly Ext: no C/C/E Skin: warm/dry no rash   Lab Results:  Recent Labs  03/09/15 0410 03/10/15 0404  WBC 10.2 12.4*  HGB 11.1* 11.7*  PLT 217 285    Recent Labs  03/09/15 0410 03/10/15 0404  NA 138 139  K 3.4* 2.9*  CL 95* 98*  CO2 37* 36*  GLUCOSE 147* 96  BUN 18 19  CREATININE 0.47 0.51    Recent Labs  03/09/15 1045 03/09/15 1807  TROPONINI 0.08* 0.10*    Tele: Personally reviewed: Normal sinus rhythm  Assessment/Plan:  1. Atrial fibrillation with RVR: Patient is maintaining sinus rhythm. Will continue amiodarone 400 mg twice daily. Will increase nebivolol back to her baseline dose. The patient has had atrial fibrillation in the setting of critical illness. She has had 2 recurrences now. Considering her high CHADS-Vasc score, I would be in favor of continuing heparin another 24 hours, then transitioning to a NOAC drug tomorrow with plans to continue at least for one month.  2. HCM with acute on chronic diastolic heart failure. Would continue  IV furosemide cautiously in the setting of HCM. Blood pressure is elevated today and will begin to add back some of her home antihypertensive therapy.   Tonny Bollmanooper, Blaire Palomino, M.D. 03/10/2015, 9:03 AM

## 2015-03-10 NOTE — Progress Notes (Signed)
Family at bedside and updated. E-link MD present via camera. No complications at this time. Cough and deep breathing encouraged as well as IS (pt achieved 750). Voice raspy but pt reports no pain. Will continue to monitor. Modena JanskyKevin Marthena Whitmyer RN 2 [facility]

## 2015-03-10 NOTE — Plan of Care (Signed)
Problem: Phase I Progression Outcomes Goal: Graft patent without s/s of occlusion Outcome: Progressing Palpable pulses. Goal: Tolerates diet without nausea/vomiting Outcome: Progressing Pt had tube feeds but self extubated late 03/09/15. Diet to be assessed by MD in AM. Goal: Weaning O2 to maintain Sats > 90% Outcome: Progressing Pt self-extubated 11/24 but vitals signs WNL. Goal: Pain controlled with appropriate interventions Outcome: Progressing Since self extubation pt reports less pain and has required less PRN.

## 2015-03-10 NOTE — Progress Notes (Signed)
eLink Physician-Brief Progress Note Patient Name: Beverly Li DOB: 1945/10/05 MRN: [id number]   Date of Service  03/10/2015  HPI/Events of Note  /trk  Recent Labs Lab 03/07/15 0338 03/08/15 0345 03/09/15 0410 03/10/15 0404 03/10/15 1507  K 4.8 3.9 3.4* 2.9* 3.5    Recent Labs Lab 03/07/15 0338 03/08/15 0345 03/09/15 0410 03/10/15 0404 03/10/15 1507  CREATININE 0.57 0.36* 0.47 0.51 0.50      eICU Interventions  Iv kcl 10meq x 2     Intervention Category Minor Interventions: Electrolytes abnormality - evaluation and management  [name] 03/10/2015, 5:45 PM

## 2015-03-11 ENCOUNTER — Encounter (HOSPITAL_COMMUNITY): Payer: Medicare Other

## 2015-03-11 DIAGNOSIS — I480 Paroxysmal atrial fibrillation: Secondary | ICD-10-CM

## 2015-03-11 LAB — CBC WITH DIFFERENTIAL/PLATELET
BASOS ABS: 0 10*3/uL (ref 0.0–0.1)
Basophils Relative: 0 %
EOS ABS: 0 10*3/uL (ref 0.0–0.7)
Eosinophils Relative: 0 %
HCT: 36.5 % (ref 36.0–46.0)
HEMOGLOBIN: 12.1 g/dL (ref 12.0–15.0)
LYMPHS PCT: 14 %
Lymphs Abs: 2.3 10*3/uL (ref 0.7–4.0)
MCH: 30.4 pg (ref 26.0–34.0)
MCHC: 33.2 g/dL (ref 30.0–36.0)
MCV: 91.7 fL (ref 78.0–100.0)
MONOS PCT: 7 %
Monocytes Absolute: 1.1 10*3/uL — ABNORMAL HIGH (ref 0.1–1.0)
NEUTROS PCT: 79 %
Neutro Abs: 12.7 10*3/uL — ABNORMAL HIGH (ref 1.7–7.7)
PLATELETS: 380 10*3/uL (ref 150–400)
RBC: 3.98 MIL/uL (ref 3.87–5.11)
RDW: 14.6 % (ref 11.5–15.5)
WBC: 16.1 10*3/uL — ABNORMAL HIGH (ref 4.0–10.5)

## 2015-03-11 LAB — MAGNESIUM: Magnesium: 2.3 mg/dL (ref 1.7–2.4)

## 2015-03-11 LAB — GLUCOSE, CAPILLARY
Glucose-Capillary: 95 mg/dL (ref 65–99)
Glucose-Capillary: 77 mg/dL (ref 65–99)

## 2015-03-11 LAB — RENAL FUNCTION PANEL
Albumin: 2.4 g/dL — ABNORMAL LOW (ref 3.5–5.0)
Anion gap: 7 (ref 5–15)
BUN: 24 mg/dL — ABNORMAL HIGH (ref 6–20)
CO2: 35 mmol/L — ABNORMAL HIGH (ref 22–32)
Calcium: 8.4 mg/dL — ABNORMAL LOW (ref 8.9–10.3)
Chloride: 98 mmol/L — ABNORMAL LOW (ref 101–111)
Creatinine, Ser: 0.66 mg/dL (ref 0.44–1.00)
GFR calc Af Amer: >60 mL/min (ref >60)
GFR calc non Af Amer: >60 mL/min (ref >60)
Glucose, Bld: 88 mg/dL (ref 65–99)
Phosphorus: 4.5 mg/dL (ref 2.5–4.6)
Potassium: 3.2 mmol/L — ABNORMAL LOW (ref 3.5–5.1)
Sodium: 140 mmol/L (ref 135–145)

## 2015-03-11 LAB — HEPARIN LEVEL (UNFRACTIONATED): Heparin Unfractionated: 0.44 IU/mL (ref 0.30–0.70)

## 2015-03-11 MED ORDER — POTASSIUM CHLORIDE 10 MEQ/50ML IV SOLN
10.0000 meq | INTRAVENOUS | Status: AC
  Administered 2015-03-11 (×3): 10 meq via INTRAVENOUS
  Filled 2015-03-11 (×2): qty 50

## 2015-03-11 MED ORDER — POTASSIUM CHLORIDE 10 MEQ/50ML IV SOLN
INTRAVENOUS | Status: AC
  Administered 2015-03-11: 10 meq via INTRAVENOUS
  Filled 2015-03-11: qty 50

## 2015-03-11 MED ORDER — SALINE SPRAY 0.65 % NA SOLN
1.0000 | NASAL | Status: DC | PRN
  Filled 2015-03-11: qty 44

## 2015-03-11 NOTE — Evaluation (Signed)
Physical Therapy Evaluation Patient Details Name: Beverly Li MRN: [id number] DOB: 1945/05/15 Today's Date: 03/11/2015   History of Present Illness  pt is a 69 y/o female with h/o HTN, afib and PVD, admitted with complaints of lifestyle limiting bil LE claudication.  Pt s/p R common femoral art endarterectomy and R fem-pop BPG.  Clinical Impression  Pt admitted with/for fem-pop bpg and endarterectomy.  Pt currently limited functionally due to the problems listed below.  (see problems list.)  Pt will benefit from PT to maximize function and safety to be able to get home safely with available assist of family.     Follow Up Recommendations Home health PT (or no follow up if progresses quickly)    Equipment Recommendations  Other (comment) (TBA)    Recommendations for Other Services       Precautions / Restrictions Precautions Precautions: Fall      Mobility  Bed Mobility Overal bed mobility: Needs Assistance Bed Mobility: Supine to Sit     Supine to sit: Min assist;HOB elevated        Transfers Overall transfer level: Needs assistance   Transfers: Sit to/from Stand Sit to Stand: Min assist;+2 physical assistance;+2 safety/equipment         General transfer comment: cues for hand placement and lifting /stability assist  Ambulation/Gait Ambulation/Gait assistance: Min assist;+2 safety/equipment Ambulation Distance (Feet): 110 Feet Assistive device: Rolling walker (2 wheeled) Gait Pattern/deviations: Step-through pattern Gait velocity: slow Gait velocity interpretation: Below normal speed for age/gender General Gait Details: shaky, tremulous gait  Stairs            Wheelchair Mobility    Modified Rankin (Stroke Patients Only)       Balance Overall balance assessment: Needs assistance   Sitting balance-Leahy Scale: Fair     Standing balance support: Bilateral upper extremity supported Standing balance-Leahy Scale: Poor Standing balance  comment: reliant on RW                             Pertinent Vitals/Pain Pain Assessment: Faces Faces Pain Scale: Hurts even more Pain Location: groin worse than leg Pain Descriptors / Indicators: Aching;Burning;Grimacing Pain Intervention(s): Monitored during session    Home Living Family/patient expects to be discharged to:: Private residence Living Arrangements: Spouse/significant other Available Help at Discharge: Family Type of Home: House                Prior Function Level of Independence: Independent               Hand Dominance        Extremity/Trunk Assessment   Upper Extremity Assessment: Overall WFL for tasks assessed           Lower Extremity Assessment: Generalized weakness         Communication   Communication: No difficulties  Cognition Arousal/Alertness: Awake/alert Behavior During Therapy: WFL for tasks assessed/performed Overall Cognitive Status: Within Functional Limits for tasks assessed                      General Comments General comments (skin integrity, edema, etc.): sats dropped to 82 % on RA,  maintained in mid 90's on 4-5 L [place]    Exercises        Assessment/Plan    PT Assessment Patient needs continued PT services  PT Diagnosis Difficulty walking;Acute pain;Generalized weakness   PT Problem List Decreased strength;Decreased activity tolerance;Decreased balance;Decreased mobility;Decreased knowledge of  use of DME;Pain  PT Treatment Interventions Gait training;Stair training;Functional mobility training;Therapeutic activities;Therapeutic exercise;Patient/family education   PT Goals (Current goals can be found in the Care Plan section) Acute Rehab PT Goals Patient Stated Goal: Home soon PT Goal Formulation: With patient Time For Goal Achievement: 03/25/15 Potential to Achieve Goals: Good    Frequency Min 3X/week   Barriers to discharge        Co-evaluation               End of  Session Equipment Utilized During Treatment: Oxygen Activity Tolerance: Patient tolerated treatment well Patient left: in chair;with call bell/phone within reach Nurse Communication: Mobility status         Time: 1245-1314 PT Time Calculation (min) (ACUTE ONLY): 29 min   Charges:   PT Evaluation $Initial PT Evaluation Tier I: 1 Procedure PT Treatments $Gait Training: 8-22 mins   PT G Codes:        Nadja Lina, Eliseo GumKenneth V 03/11/2015, 4:26 PM 03/11/2015  [place] BingKen Cambrea Kirt, PT [phone]  (pager)

## 2015-03-11 NOTE — Progress Notes (Signed)
ANTICOAGULATION CONSULT NOTE - Follow Up Consult  Pharmacy Consult for Heparin  Indication: atrial fibrillation   Patient Measurements: Height: 5\' 5"  (165.1 cm) Weight: 132 lb 7.9 oz (60.1 kg) IBW/kg (Calculated) : 57  Vital Signs: Temp: 98.4 F (36.9 C) (11/26 0722) Temp Source: Oral (11/26 0722) BP: 142/70 mmHg (11/26 0700) Pulse Rate: 66 (11/26 0700)  Labs:  Recent Labs  03/09/15 0410 03/09/15 0540 03/09/15 1045  03/09/15 1807 03/09/15 2308 03/10/15 0404 03/10/15 0544 03/10/15 1507 03/11/15 0415 03/11/15 0416  HGB 11.1*  --   --   --   --   --  11.7*  --   --   --  12.1  HCT 34.5*  --   --   --   --   --  36.8  --   --   --  36.5  PLT 217  --   --   --   --   --  285  --   --   --  380  HEPARINUNFRC 0.26*  --   --   < >  --  0.48  --  0.49  --   --  0.44  CREATININE 0.47  --   --   --   --   --  0.51  --  0.50 0.66  --   TROPONINI  --  0.06* 0.08*  --  0.10*  --   --   --   --   --   --   < > = values in this interval not displayed.  Estimated Creatinine Clearance: 59.7 mL/min (by C-G formula based on Cr of 0.66).  Assessment:  AC: s/p R fem-pop bypass on 11/17. 11/23 Developed acute post-op respiratory failure. AFib with RVR; IV heparin started on 11/23. Converted to SR 11/23 pm.  Continues in NSR this am on po amiodarone and IV heparin. Heparin continues to be at goal. No bleeding issues noted, cbc stable overnight. No rate changes planned for today will continue to follow for transition to [employer] possibly once out of ICU.   Goal of Therapy:  Heparin level 0.3-0.7 units/ml Monitor platelets by anticoagulation protocol: Yes   Plan:  -Continue heparin at 1150 units/hr -HL with AM labs  Sheppard CoilFrank Raguel Kosloski PharmD., BCPS Clinical Pharmacist Pager [phone] 03/11/2015 10:30 AM

## 2015-03-11 NOTE — Progress Notes (Addendum)
[facility]  Subjective  - Sitting up in the chair.  She states she feels very weak and has increased left LQ pain when she coughs.  She has history of hernia repair bilateral groins.   Objective 142/70 66 98.4 F (36.9 C) (Oral) 13 92%  Intake/Output Summary (Last 24 hours) at 03/11/15 0804 Last data filed at 03/11/15 0700  Gross per 24 hour  Intake 1309.6 ml  Output   3776 ml  Net -2466.4 ml   Palpable right DP, doppler left DP/PT Incisions clean and dry groin soft 4 x 4 in place Distal active range of motion intact Left groin and lower abdomin soft without hernia palpated Lungs clear to auscultation Heart Irregular  Assessment/Planning: POD # 9 right femoral to below-knee popliteal bypass with composite vein graft  Lungs clear to auscultation will repeat chest x ray tomorrow she is now 2 days post extubation.   PT mobility Clear liquid diet will advance as tolerates Glucose stable will d/c glucose monitoring Afib/cardiomyopathy managed by cardiology    Thomasena EdisCOLLINS, W[facility] Rehabilitation CenterEMMA [facility] HospitalMAUREEN 03/11/2015 8:04 AM --  Laboratory Lab Results:  Recent Labs  03/10/15 0404 03/11/15 0416  WBC 12.4* 16.1*  HGB 11.7* 12.1  HCT 36.8 36.5  PLT 285 380   BMET  Recent Labs  03/10/15 1507 03/11/15 0415  NA 140 140  K 3.5 3.2*  CL 96* 98*  CO2 35* 35*  GLUCOSE 103* 88  BUN 19 24*  CREATININE 0.50 0.66  CALCIUM 8.5* 8.4*    COAG Lab Results  Component Value Date   INR 1.01 02/27/2015   No results found for: PTT    I agree with the above.  I have seen and evaluated the patient.  Pulmonary: Respirations are nonlabored.  Her O2 sats in the mid 90s on nasal cannula.  Her chest x-ray from yesterday shows bilateral pleural effusions.  I'll repeat her chest x-ray tomorrow.  Continue pulmonary toilet GU: Adequate urine output, fully removed.  Creatinine stable GI: Slowly advance diet Cardiovascular: Cardiology following for cardiomyopathy.   Appreciate their input Disposition: We'll keep in the ICU 1 more day.  Encourage mobility.  Physical therapy to help with walking and out of bed. Vascular: Incisions are healing appropriately.  There is some erythema on the inner side of the right leg.  She has palpable right lower extremity pedal pulse  Wells Brabham

## 2015-03-11 NOTE — Progress Notes (Signed)
PULMONARY / CRITICAL CARE MEDICINE   Name: Beverly Li MRN: [id number] DOB: 1945/07/17    ADMISSION DATE:  03/02/2015 CONSULTATION DATE:  03/02/2015  REFERRING MD :  Dr Edilia Bo [facility]  CHIEF COMPLAINT:  Acute post op resp failure  BRIEF: 69 year old female with baseline claudication but no dyspnea but has some HOCM (followed by Dr Anne Fu) but at baseline and has had a normal cardiac stress test and is able to climb stairs without dyspnea. He underwent on 03/02/2015 right below knee popliteal artery femoropopliteal bypass and femoral artery endarterectomy on the right. Overall operative time was approximately 4 hours according to anesthesia. First 2 hours no problem but subsequently started having severe evidence of hypertrophic obstructive cardiomyopathy as evidenced by intraoperative echo and periods of hypotension. Status post 4-5 liters of fluid and for albumin infusions. Brought into the intensive care unit postoperatively and was spontaneously weaned off Neo-Synephrine but was found to be in respiratory acidosis with severe hypoxemia and chest x-ray with acute pulmonary edema bilaterally. Pulmonary critical care called for consultation 03/02/15  EVENTS: 11/17 - Admit to hospital 11/17 - Right below knee Fem-Pop bypass w/ femoral artery endarterectomy on right 11/18 - Developed fever & antibiotics started for empiric coverage 11/20 - A fib w/ RVR & converted to NSR 11/23 - A fib w/ RVR again started on Amio gtt 11/24 - Converted back to NSR & Self-extubated.  STUDIES: TTE 11/19 - EF 65-70% w/ moderate LVH. Grade 2 diastolic dysfunction. Left pleural effusion. EKG 11/24 - Sinus brady. QTc . Port CXR 11/25 - Bilateral lower lung opacities unchanged.  MICROBIOLOGY: Blood Ctx 11/18 - Negative Trach Asp 11/18 - Oral flora  ANTIBIOTICS: Vancomycin 11/18 - 11/24 Levaquin 11/18 - 11/24  SUBJECTIVE: t. Denies any dyspnea on nasal cannula oxygen. Very interactive.  VITAL  SIGNS: Temp:  [97.8 F (36.6 C)-98.7 F (37.1 C)] 98.4 F (36.9 C) (11/26 0722) Pulse Rate:  [59-67] 60 (11/26 1000) Resp:  [11-19] 14 (11/26 1000) BP: (74-168)/(52-78) 148/66 mmHg (11/26 1000) SpO2:  [92 %-99 %] 98 % (11/26 1006) Weight:  [132 lb 7.9 oz (60.1 kg)] 132 lb 7.9 oz (60.1 kg) (11/26 0700) HEMODYNAMICS:   VENTILATOR SETTINGS:   INTAKE / OUTPUT:  Intake/Output Summary (Last 24 hours) at 03/11/15 1056 Last data filed at 03/11/15 1000  Gross per 24 hour  Intake 1154.5 ml  Output   2636 ml  Net -1481.5 ml    PHYSICAL EXAMINATION: General:  Awake. Alert. No acute distress. Husband at bedside. Laughing and cooperative Integument:  Warm & dry. No rash on exposed skin. Left IJ in place. HEENT:  No scleral injection or icterus. PERRL. Cardiovascular:  Regular rate & NSR on telemetry. No edema. No appreciable JVD.  Pulmonary:  Coarse breath sounds bilaterally. Symmetric chest wall expansion. Normal work of breathing on nasal cannula oxygen. No distress Abdomen: Soft. Hypoactive bowel sounds. Nondistended.  Neurological:  Grossly nonfocal. Oriented to person, place, time, & situation. CN grossly in tact.  PULMONARY  Recent Labs Lab 03/05/15 0349 03/05/15 1120 03/06/15 0342 03/10/15 0039  PHART 7.417 7.341* 7.318* 7.524*  PCO2ART 46.3* 56.5* 64.3* 40.9  PO2ART 63.0* 68.0* 72.2* 61.2*  HCO3 29.7* 30.5* 32.1* 33.5*  TCO2 31 32 34.0 34.7  O2SAT 91.0 92.0 93.8 92.2    CBC  Recent Labs Lab 03/09/15 0410 03/10/15 0404 03/11/15 0416  HGB 11.1* 11.7* 12.1  HCT 34.5* 36.8 36.5  WBC 10.2 12.4* 16.1*  PLT 217 285 380   CARDIAC  Recent Labs Lab 03/09/15 0540 03/09/15 1045 03/09/15 1807  TROPONINI 0.06* 0.08* 0.10*    CHEMISTRY  Recent Labs Lab 03/06/15 1105 03/07/15 0338 03/08/15 0345 03/09/15 0410 03/10/15 0404 03/10/15 1507 03/11/15 0415  NA  --  140 140 138 139 140 140  K  --  4.8 3.9 3.4* 2.9* 3.5 3.2*  CL  --  96* 95* 95* 98* 96* 98*   CO2  --  37* 36* 37* 36* 35* 35*  GLUCOSE  --  158* 158* 147* 96 103* 88  BUN  --  26* 19 18 19 19  24*  CREATININE  --  0.57 0.36* 0.47 0.51 0.50 0.66  CALCIUM  --  9.0 8.7* 8.3* 8.3* 8.5* 8.4*  MG  --  2.2 1.8 2.0  --  2.1 2.3  PHOS 2.9 3.3 2.0* 3.4  --   --  4.5   Estimated Creatinine Clearance: 59.7 mL/min (by C-G formula based on Cr of 0.66).   LIVER  Recent Labs Lab 03/07/15 0338 03/08/15 0345 03/09/15 0410 03/11/15 0415  ALBUMIN 2.6* 2.2* 2.1* 2.4*     INFECTIOUS  Recent Labs Lab 03/05/15 0414  PROCALCITON 0.24     ENDOCRINE CBG (last 3)   Recent Labs  03/10/15 1617 03/10/15 2125 03/11/15 0718  GLUCAP 97 89 77     IMAGING x48h Dg Chest Port 1 View  03/10/2015  CLINICAL DATA:  Health care associated pneumonia. EXAM: PORTABLE CHEST 1 VIEW COMPARISON:  March 08, 2015. FINDINGS: Stable cardiomegaly. No pneumothorax is noted. Endotracheal and nasogastric tubes have been removed. Left internal jugular catheter is stable with distal tip in expected position of the SVC. Stable calcified granuloma is noted in right upper lobe. Stable bibasilar densities are noted, with right greater than left, consistent with edema or pneumonia with probable associated pleural effusion. Bony thorax is unremarkable. IMPRESSION: Stable bibasilar opacity is noted, right greater than left, concerning for edema or pneumonia with associated pleural effusions. Electronically Signed   By: Lupita RaiderJames  Green Jr, M.D.   On: 03/10/2015 07:36     ASSESSMENT / PLAN:  PULMONARY ETT 11/17 - 11/24 (self-extubated) A:  Acute Hypoxic Respiratory Failure -  Secondary to acute pulmonary edema and HCAP. Self Extubated 10/24 @ 10pm. Wheezing w/ Long h/o Tobacco Use - Probable COPD with exacerbation. Chronic Hypercarbic Respiratory Failure pulm edema, effusions  P:   Continue nasal cannula oxygen to maintain FiO2 >94%. Duoneb q6hr Pulmicort 0.5mg  neb bid Intermittent CXR  CARDIOVASCULAR Lt IJ  CVL 11/17 >> A:  Hypertrophic Cardiomyopathy with Cardiogenic Shock - Shock resolved. Complicated by sedative effects. A fib with RVR - Recurred AM 11/23 & converted back to NS on Amio gtt. Previously resolved on 11/20. S/P Right Fem-Pop Bypass. H/O HTN & HLD.  P:  Cardiology following Amiodarone bid Continue heparin gtt Avoiding tachycardia/hypovolemia Post-op care per [facility] ASA 324mg  via tube daily Crestor 5mg  via tube qhs Lasix IV q12hr Bystolic 5mg  daily Norvasc 5mg  daily Hold outpt cozaar  RENAL Lab Results  Component Value Date   CREATININE 0.66 03/11/2015   CREATININE 0.50 03/10/2015   CREATININE 0.51 03/10/2015    A:   Hypokalemia - Replacing IV. Hypophosphatemia - Resolved. Hypomagnesemia - Resolved.  P:   Trending UOP with foley catheter Monitoring renal function with daily BUN/Creatinine Monitoring electrolytes q12hr on Lasix  GASTROINTESTINAL A:  Nutrition - increased gastric residuals 11/24. Constipation - Enema 11/24 unsuccessful.   P:   Advance diet as tolerated Protonix PO. Sennakot bid Dulcolax  prn  HEMATOLOGIC A:   Leukocytosis - Previously resolved. Now mild. Anemia - Mild. No signs of active bleeding.  P:  Trending Hgb & Leukocytosis w/ daily CBC D/C SCDs Heparin gtt per pharmacy protocol  INFECTIOUS A:  HCAP. H/O PCN allergy.  P:   Completed 7 day course of Levaquin & Vancomycin Plan to re-culture for any fever Trending mild leukocytosis  ENDOCRINE CBG (last 3)   Recent Labs  03/10/15 1617 03/10/15 2125 03/11/15 0718  GLUCAP 97 89 77     A:   Hyperglycemia -Off tube feedings taking diet. No h/o DM.  P:   Changing accuchecks to qAC & HS Low dose SSI coverage  NEUROLOGIC A:  Delirium. H/O Insomnia & Anxiety.  P:   RASS goal: 0  Wellbutrin  bid Ultram prn pain D/C Precedex, Versed, & Fentanyl Holding outpt Ambien for now  FAMILY: Husband at bedside this morning (11/25) and updated at length. He's  happy with progress.  TODAY'S SUMMARY: 69 year old female status post femoropopliteal bypass. Now in NSR after starting amiodarone drip. Looks great after self extubation.Off drips, alert and cooperative. No acute distress. May be ready for sdu/tele per primary MD.   Brett Canales Minor ACNP Adolph Pollack PCCM Pager [phone] till 3 pm If no answer page [phone] 03/11/2015, 10:57 AM   STAFF NOTE: I, Rory Percy, MD FACP have personally reviewed patient's available data, including medical history, events of note, physical examination and test results as part of my evaluation. I have discussed with resident/NP and other care providers such as pharmacist, RN and RRT. In addition, I personally evaluated patient and elicited key findings of: no distress, pcxr reviewed, did well with current lasix dosing was neg 2.7 liters with this approach, may need to [place] chest and assess for effusions with pcxr appearance noted, IS, mobilize, hope can get physical therapy today,  k supp, bmet, mag, phos in am with diuresis  Mcarthur Rossetti. Tyson Alias, MD, FACP Pgr: [phone] [facility] 03/11/2015 11:56 AM

## 2015-03-11 NOTE — Procedures (Signed)
Pt states that she does not want the 2 am treatment if she is asleep.

## 2015-03-11 NOTE — Progress Notes (Signed)
Patient ID: Beverly Li, female   DOB: Dec 03, 1945, 69 y.o.   MRN: [id number]    Patient Name: Beverly Li Date of Encounter: 03/11/2015     Active Problems:   PAD (peripheral artery disease) (HCC)   Acute respiratory failure with hypoxia (HCC)   Encounter for central line placement   Hypertrophic obstructive cardiomyopathy (HCC)   Demand ischemia (HCC)   Hypophosphatemia   Acute on chronic diastolic CHF (congestive heart failure), NYHA class 4 (HCC)   HCAP (healthcare-associated pneumonia)   S/P femoral-popliteal bypass surgery   Hypokalemia    SUBJECTIVE  Her dyspnea is improved. No chest pain or sob. Cough also better.  CURRENT MEDS . amiodarone  400 mg Oral BID  . amLODipine  5 mg Oral Daily  . antiseptic oral rinse  7 mL Mouth Rinse BID  . aspirin EC  325 mg Oral Daily  . B-complex with vitamin C  1 tablet Oral Daily  . budesonide (PULMICORT) nebulizer solution  0.5 mg Nebulization BID  . buPROPion  150 mg Oral BID  . furosemide  20 mg Intravenous BID  . insulin aspart  0-5 Units Subcutaneous QHS  . insulin aspart  0-9 Units Subcutaneous TID WC  . ipratropium-albuterol  3 mL Nebulization Q6H  . multivitamin with minerals  1 tablet Oral QHS  . nebivolol  10 mg Oral Daily  . pantoprazole  40 mg Oral Daily  . potassium chloride  10 mEq Intravenous Q1 Hr x 3  . rosuvastatin  5 mg Oral QHS  . senna-docusate  1 tablet Oral BID  . sodium chloride  10-40 mL Intracatheter Q12H    OBJECTIVE  Filed Vitals:   03/11/15 0500 03/11/15 0600 03/11/15 0700 03/11/15 0722  BP: 120/58 132/67 142/70   Pulse: 62 67 66   Temp:    98.4 F (36.9 C)  TempSrc:    Oral  Resp: Height:      Weight:   132 lb 7.9 oz (60.1 kg)   SpO2: 98% 94% 92%     Intake/Output Summary (Last 24 hours) at 03/11/15 0952 Last data filed at 03/11/15 0900  Gross per 24 hour  Intake 1284.8 ml  Output   2951 ml  Net -1666.2 ml   Filed Weights   03/09/15 0500 03/10/15 0500 03/11/15  0700  Weight: 141 lb 5 oz (64.1 kg) 140 lb 10.5 oz (63.8 kg) 132 lb 7.9 oz (60.1 kg)    PHYSICAL EXAM  General: Pleasant, NAD. Neuro: Alert and oriented X 3. Moves all extremities spontaneously. Psych: Normal affect. HEENT:  Normal  Neck: Supple without bruits or JVD. Lungs:  Resp regular and unlabored, CTA. Heart: RRR no s3, s4, or murmurs. Abdomen: Soft, non-tender, non-distended, BS + but reduced.  Extremities: No clubbing, cyanosis or edema. DP/PT/Radials 2+ and equal bilaterally.  Accessory Clinical Findings  CBC  Recent Labs  03/09/15 0410 03/10/15 0404 03/11/15 0416  WBC 10.2 12.4* 16.1*  NEUTROABS 6.7  --  12.7*  HGB 11.1* 11.7* 12.1  HCT 34.5* 36.8 36.5  MCV 92.0 92.2 91.7  PLT 217 285 380   Basic Metabolic Panel  Recent Labs  03/09/15 0410  03/10/15 1507 03/11/15 0415  NA 138  < > 140 140  K 3.4*  < > 3.5 3.2*  CL 95*  < > 96* 98*  CO2 37*  < > 35* 35*  GLUCOSE 147*  < > 103* 88  BUN 18  < > 19  24*  CREATININE 0.47  < > 0.50 0.66  CALCIUM 8.3*  < > 8.5* 8.4*  MG 2.0  --  2.1 2.3  PHOS 3.4  --   --  4.5  < > = values in this interval not displayed. Liver Function Tests  Recent Labs  03/09/15 0410 03/11/15 0415  ALBUMIN 2.1* 2.4*   No results for input(s): LIPASE, AMYLASE in the last 72 hours. Cardiac Enzymes  Recent Labs  03/09/15 0540 03/09/15 1045 03/09/15 1807  TROPONINI 0.06* 0.08* 0.10*   BNP Invalid input(s): POCBNP D-Dimer No results for input(s): DDIMER in the last 72 hours. Hemoglobin A1C  Recent Labs  03/09/15 0410  HGBA1C 6.3*   Fasting Lipid Panel No results for input(s): CHOL, HDL, LDLCALC, TRIG, CHOLHDL, LDLDIRECT in the last 72 hours. Thyroid Function Tests No results for input(s): TSH, T4TOTAL, T3FREE, THYROIDAB in the last 72 hours.  Invalid input(s): FREET3  TELE  NSR  Radiology/Studies  Dg Chest Port 1 View  03/10/2015  CLINICAL DATA:  Health care associated pneumonia. EXAM: PORTABLE CHEST 1 VIEW  COMPARISON:  March 08, 2015. FINDINGS: Stable cardiomegaly. No pneumothorax is noted. Endotracheal and nasogastric tubes have been removed. Left internal jugular catheter is stable with distal tip in expected position of the SVC. Stable calcified granuloma is noted in right upper lobe. Stable bibasilar densities are noted, with right greater than left, consistent with edema or pneumonia with probable associated pleural effusion. Bony thorax is unremarkable. IMPRESSION: Stable bibasilar opacity is noted, right greater than left, concerning for edema or pneumonia with associated pleural effusions. Electronically Signed   By: Lupita Raider, M.D.   On: 03/10/2015 07:36   Dg Chest Port 1 View  03/08/2015  CLINICAL DATA:  NG tube placement EXAM: PORTABLE CHEST 1 VIEW COMPARISON:  03/06/2015 FINDINGS: Cardiomegaly. Central vascular congestion without convincing pulmonary edema. Hazy infiltrate in right lower lobe suspicious for pneumonia. Endotracheal and NG tube in place. Stable left IJ central line position. IMPRESSION: No convincing pulmonary edema. Central vascular congestion. Hazy infiltrate in right lower lobe suspicious for pneumonia. Electronically Signed   By: Natasha Mead M.D.   On: 03/08/2015 10:55   Dg Chest Port 1 View  03/06/2015  CLINICAL DATA:  69 year old female status post right-sided fem-pop bypass graft and right femoral endarterectomy. History of claudication. EXAM: PORTABLE CHEST 1 VIEW COMPARISON:  Chest x-ray 03/05/2015. FINDINGS: An endotracheal tube is in place with tip 2.8 cm above the carina. There is a left-sided internal jugular central venous catheter with tip terminating in the distal superior vena cava. A nasogastric tube is seen extending into the stomach, however, the tip of the nasogastric tube extends below the lower margin of the image. Lung volumes are low. Extensive opacities throughout the mid to lower lungs bilaterally may reflect areas of atelectasis and/or airspace  consolidation. Moderate bilateral pleural effusions. Heart size is mildly enlarged. Cephalization of the pulmonary vasculature. The patient is rotated to the right on today's exam, resulting in distortion of the mediastinal contours and reduced diagnostic sensitivity and specificity for mediastinal pathology. Atherosclerosis in the thoracic aorta. IMPRESSION: 1. Support apparatus, as above. 2. The appearance the chest is very similar to the prior examination. Findings may simply reflect congestive heart failure, with extensive bibasilar atelectasis in the setting of moderate bilateral pleural effusions, however, underlying airspace consolidation throughout the mid to lower lungs from infectious etiologies or aspiration is not excluded. 3. Atherosclerosis. Electronically Signed   By: Brayton Mars.D.  On: 03/06/2015 07:16   Dg Chest Port 1 View  03/05/2015  CLINICAL DATA:  Respiratory failure. EXAM: PORTABLE CHEST 1 VIEW COMPARISON:  03/04/2015 FINDINGS: Endotracheal tube, enteric catheter, left internal jugular approach central venous catheter stable. Cardiomediastinal silhouette is enlarged. Mediastinal contours appear intact. There is no evidence of pneumothorax. Bilateral lower lobe predominance airspace opacities are not significantly changed. Osseous structures are without acute abnormality. Soft tissues are grossly normal. IMPRESSION: No significant change in the aeration of the lungs with bilateral lower lobe airspace opacities, which may represent bilateral pleural effusions with atelectasis or airspace consolidation. Stable supporting lines and tubes. Electronically Signed   By: Ted Mcalpine M.D.   On: 03/05/2015 09:05   Dg Chest Port 1 View  03/04/2015  CLINICAL DATA:  Subsequent encounter. Respiratory distress. Intubated patient. EXAM: PORTABLE CHEST 1 VIEW COMPARISON:  03/03/2015 FINDINGS: Hazy airspace opacity in the mid to lower lungs has improved on the right compared to the prior  exam. Some of this may be a technical change only. There are persistent irregular interstitial opacities in the mid and lower lungs bilaterally. The upper lungs are clear. Lungs remain hyperexpanded. No convincing pleural effusion or pneumothorax. Cardiac silhouette is mildly enlarged. Endotracheal tube, left internal jugular central venous line and orogastric tube are stable and well positioned. IMPRESSION: 1. Improved aeration of the right mid to lower lung when compared to the previous day's study. 2. No other change. 3. There persistent mid and lower lung zone opacities which may reflect bilateral pneumonia or pulmonary edema superimposed on changes of COPD, the latter is favored. 4. Support apparatus is stable and well positioned. Electronically Signed   By: Amie Portland M.D.   On: 03/04/2015 07:41   Dg Chest Port 1 View  03/03/2015  CLINICAL DATA:  Check endotracheal tube placement EXAM: PORTABLE CHEST - 1 VIEW COMPARISON:  03/02/2015 FINDINGS: Cardiac shadow is stable. An endotracheal tube, nasogastric catheter and left jugular central line are seen in satisfactory position. Slight increase in the degree of infiltrative density in the right mid and lower lung is noted. Slight improved aeration on the left is seen. IMPRESSION: Slight improved aeration in the left base. Increasing right basilar infiltrate. Electronically Signed   By: Alcide Clever M.D.   On: 03/03/2015 07:42   Dg Chest Portable 1 View  03/02/2015  CLINICAL DATA:  Central line placement, hypertension, hypertrophic cardiomyopathy, atrial fibrillation, former smoker, GERD EXAM: PORTABLE CHEST 1 VIEW COMPARISON:  Portable exam 1704 hours compared to 03/02/2015 FINDINGS: Tip of endotracheal tube projects 4.7 cm above carina. New LEFT jugular central venous catheter tip projecting over SVC. Stable heart size and mediastinal contours. Pulmonary vascular congestion. COPD changes with perihilar infiltrates likely pulmonary edema. No gross  pleural effusion or pneumothorax. Bones demineralized. Capsular calcification in the RIGHT breast prosthesis. IMPRESSION: No pneumothorax following LEFT jugular line placement. Pulmonary vascular congestion and perihilar infiltrates question pulmonary edema, question minimally improved since previous study. Electronically Signed   By: Ulyses Southward M.D.   On: 03/02/2015 17:16   Dg Chest Port 1 View  03/02/2015  CLINICAL DATA:  Recent intubation, status post femoral popliteal surgery EXAM: PORTABLE CHEST - 1 VIEW COMPARISON:  09/19/2012 FINDINGS: The cardiac shadow is stable. Diffuse vascular congestion is seen. Calcified breast implant is noted on the right. The left breast implant is not calcified. Interstitial and alveolar edema is noted. Endotracheal tube is seen in satisfactory position approximately 4.3 cm above the carina. No bony abnormality is noted. IMPRESSION:  Vascular congestion and pulmonary edema predominately of an interstitial type. An endotracheal tube is noted in satisfactory position. Electronically Signed   By: Alcide CleverMark  Lukens M.D.   On: 03/02/2015 13:49   Dg Abd Portable 1v  03/08/2015  CLINICAL DATA:  NG tube placement EXAM: PORTABLE ABDOMEN - 1 VIEW COMPARISON:  12/28/2013 FINDINGS: Nasogastric tube passes well below the diaphragm. Tip lies in the distal stomach. Stomach is moderately distended with air. No evidence of bowel obstruction on the included field of view. IMPRESSION: NG tube tip projects in the distal stomach. Electronically Signed   By: Amie Portlandavid  Ormond M.D.   On: 03/08/2015 10:55    ASSESSMENT AND PLAN  1. PAF - she is maintaining NSR on amiodarone. Agree with Dr. Earmon Phoenixooper's rec's [facility].  2. HCM - she appears nearly euvolemic. Continue current meds.   Ema Hebner,M.D.  03/11/2015 9:52 AM

## 2015-03-11 NOTE — Progress Notes (Signed)
eLink Physician-Brief Progress Note Patient Name: Beverly Li DOB: [birth year] MRN: [id number]   Date of Service  03/11/2015  HPI/Events of Note  Hypotension - BP = 82/41.  eICU Interventions  Will order: 1. Hold scheduled Lasix dose.  2. Monitor CVP Q 4 hours.      Intervention Category Major Interventions: Hypotension - evaluation and management  Lenell AntuSommer,Jolyne Laye Eugene 03/11/2015, 6:32 PM

## 2015-03-12 ENCOUNTER — Inpatient Hospital Stay (HOSPITAL_COMMUNITY): Payer: Medicare Other

## 2015-03-12 DIAGNOSIS — Z95828 Presence of other vascular implants and grafts: Secondary | ICD-10-CM

## 2015-03-12 LAB — CBC WITH DIFFERENTIAL/PLATELET
BASOS PCT: 0 %
Basophils Absolute: 0.1 10*3/uL (ref 0.0–0.1)
Eosinophils Absolute: 0.1 10*3/uL (ref 0.0–0.7)
Eosinophils Relative: 1 %
HCT: 35.9 % — ABNORMAL LOW (ref 36.0–46.0)
HEMOGLOBIN: 11.5 g/dL — AB (ref 12.0–15.0)
LYMPHS PCT: 16 %
Lymphs Abs: 3.1 10*3/uL (ref 0.7–4.0)
MCH: 29.7 pg (ref 26.0–34.0)
MCHC: 32 g/dL (ref 30.0–36.0)
MCV: 92.8 fL (ref 78.0–100.0)
MONO ABS: 1.4 10*3/uL — AB (ref 0.1–1.0)
MONOS PCT: 7 %
Neutro Abs: 15.2 10*3/uL — ABNORMAL HIGH (ref 1.7–7.7)
Neutrophils Relative %: 76 %
PLATELETS: 463 10*3/uL — AB (ref 150–400)
RBC: 3.87 MIL/uL (ref 3.87–5.11)
RDW: 14.9 % (ref 11.5–15.5)
WBC: 19.6 10*3/uL — AB (ref 4.0–10.5)

## 2015-03-12 LAB — RENAL FUNCTION PANEL
ALBUMIN: 2.6 g/dL — AB (ref 3.5–5.0)
Anion gap: 9 (ref 5–15)
BUN: 22 mg/dL — AB (ref 6–20)
CO2: 34 mmol/L — ABNORMAL HIGH (ref 22–32)
Calcium: 8.6 mg/dL — ABNORMAL LOW (ref 8.9–10.3)
Chloride: 99 mmol/L — ABNORMAL LOW (ref 101–111)
Creatinine, Ser: 0.7 mg/dL (ref 0.44–1.00)
GFR calc Af Amer: >60 mL/min (ref >60)
Glucose, Bld: 100 mg/dL — ABNORMAL HIGH (ref 65–99)
PHOSPHORUS: 3.6 mg/dL (ref 2.5–4.6)
POTASSIUM: 3.5 mmol/L (ref 3.5–5.1)
Sodium: 142 mmol/L (ref 135–145)

## 2015-03-12 LAB — MAGNESIUM: MAGNESIUM: 2.3 mg/dL (ref 1.7–2.4)

## 2015-03-12 LAB — HEPARIN LEVEL (UNFRACTIONATED): Heparin Unfractionated: 0.33 IU/mL (ref 0.30–0.70)

## 2015-03-12 MED ORDER — POTASSIUM CHLORIDE 10 MEQ/50ML IV SOLN
10.0000 meq | INTRAVENOUS | Status: AC
  Administered 2015-03-12 (×3): 10 meq via INTRAVENOUS
  Filled 2015-03-12 (×3): qty 50

## 2015-03-12 MED ORDER — APIXABAN 5 MG PO TABS
5.0000 mg | ORAL_TABLET | Freq: Two times a day (BID) | ORAL | Status: DC
Start: 2015-03-12 — End: 2015-03-15
  Administered 2015-03-12 – 2015-03-15 (×6): 5 mg via ORAL
  Filled 2015-03-12 (×7): qty 1

## 2015-03-12 MED ORDER — GUAIFENESIN ER 600 MG PO TB12
1200.0000 mg | ORAL_TABLET | Freq: Two times a day (BID) | ORAL | Status: DC
  Administered 2015-03-12 – 2015-03-15 (×7): 1200 mg via ORAL
  Filled 2015-03-12 (×8): qty 2

## 2015-03-12 NOTE — Progress Notes (Signed)
    Subjective  - POD #10  Coughing better with "heart pillow" Walked 100 feet yesterday   Physical Exam:  Palpable pedal pulse on right Incisions ok       Assessment/Plan:  POD #10  Pulm:  Xray with improved pleural effusions Leukocytosis:  Could be demargination, given lack of fever, however will need to monitor for signs / symoptoms of pneumonia GI:  Reg diet GU;  Stable, voiding spontaneously Cardiac:  Appreciate cardiology assistance.  Improving Dispo:  Transfer out of ICU;  PT/OT  Brabham, Wells 03/12/2015 8:00 AM --  Filed Vitals:   03/12/15 0600 03/12/15 0750  BP: 136/75   Pulse: 66   Temp:  98.4 F (36.9 C)  Resp: 17     Intake/Output Summary (Last 24 hours) at 03/12/15 0800 Last data filed at 03/12/15 0700  Gross per 24 hour  Intake  704.5 ml  Output   1760 ml  Net -1055.5 ml     Laboratory CBC    Component Value Date/Time   WBC 19.6* 03/12/2015 0430   HGB 11.5* 03/12/2015 0430   HCT 35.9* 03/12/2015 0430   PLT 463* 03/12/2015 0430    BMET    Component Value Date/Time   NA 142 03/12/2015 0430   K 3.5 03/12/2015 0430   CL 99* 03/12/2015 0430   CO2 34* 03/12/2015 0430   GLUCOSE 100* 03/12/2015 0430   BUN 22* 03/12/2015 0430   CREATININE 0.70 03/12/2015 0430   CALCIUM 8.6* 03/12/2015 0430   GFRNONAA >60 03/12/2015 0430   GFRAA >60 03/12/2015 0430    COAG Lab Results  Component Value Date   INR 1.01 02/27/2015   No results found for: PTT  Antibiotics Anti-infectives    Start     Dose/Rate Route Frequency Ordered Stop   03/08/15 0600  vancomycin (VANCOCIN) 1,500 mg in sodium chloride 0.9 % 500 mL IVPB     1,500 mg 250 mL/hr over 120 Minutes Intravenous Every 12 hours 03/07/15 2108 03/09/15 2000   03/04/15 0800  vancomycin (VANCOCIN) IVPB 750 mg/150 ml premix  Status:  Discontinued     750 mg 150 mL/hr over 60 Minutes Intravenous Every 12 hours 03/03/15 2017 03/07/15 2108   03/03/15 2015  vancomycin (VANCOCIN) IVPB 1000  mg/200 mL premix     1,000 mg 200 mL/hr over 60 Minutes Intravenous  Once 03/03/15 2001 03/03/15 2140   03/03/15 2015  levofloxacin (LEVAQUIN) IVPB 750 mg     750 mg 100 mL/hr over 90 Minutes Intravenous Every 24 hours 03/03/15 2003 03/09/15 2215   03/02/15 1800  cefUROXime (ZINACEF) 1.5 g in dextrose 5 % 50 mL IVPB     1.5 g 100 mL/hr over 30 Minutes Intravenous Every 12 hours 03/02/15 1322 03/03/15 1107   03/02/15 1300  vancomycin (VANCOCIN) IVPB 1000 mg/200 mL premix  Status:  Discontinued     1,000 mg 200 mL/hr over 60 Minutes Intravenous Every 12 hours 03/02/15 1255 03/02/15 1320   03/02/15 0645  cefUROXime (ZINACEF) 1.5 g in dextrose 5 % 50 mL IVPB     1.5 g 100 mL/hr over 30 Minutes Intravenous To [facility] 03/01/15 1240 03/02/15 0755       V. Charlena CrossWells Brabham IV, M.D. [facility] Office: [phone] Pager:  [phone]

## 2015-03-12 NOTE — Progress Notes (Signed)
PULMONARY / CRITICAL CARE MEDICINE   Name: Beverly Li MRN: [id number] DOB: [birth year]    ADMISSION DATE:  03/02/2015 CONSULTATION DATE:  03/02/2015  REFERRING MD :  Dr Edilia Bo [facility]  CHIEF COMPLAINT:  Acute post op resp failure  BRIEF: 69 year old female with baseline claudication but no dyspnea but has some HOCM (followed by Dr Anne Fu) but at baseline and has had a normal cardiac stress test and is able to climb stairs without dyspnea. He underwent on 03/02/2015 right below knee popliteal artery femoropopliteal bypass and femoral artery endarterectomy on the right. Overall operative time was approximately 4 hours according to anesthesia. First 2 hours no problem but subsequently started having severe evidence of hypertrophic obstructive cardiomyopathy as evidenced by intraoperative echo and periods of hypotension. Status post 4-5 liters of fluid and for albumin infusions. Brought into the intensive care unit postoperatively and was spontaneously weaned off Neo-Synephrine but was found to be in respiratory acidosis with severe hypoxemia and chest x-ray with acute pulmonary edema bilaterally. Pulmonary critical care called for consultation 03/02/15  EVENTS: 11/17 - Admit to hospital 11/17 - Right below knee Fem-Pop bypass w/ femoral artery endarterectomy on right 11/18 - Developed fever & antibiotics started for empiric coverage 11/20 - A fib w/ RVR & converted to NSR 11/23 - A fib w/ RVR again started on Amio gtt 11/24 - Converted back to NSR & Self-extubated.  STUDIES: TTE 11/19 - EF 65-70% w/ moderate LVH. Grade 2 diastolic dysfunction. Left pleural effusion. EKG 11/24 - Sinus brady. QTc . Port CXR 11/25 - Bilateral lower lung opacities unchanged.  MICROBIOLOGY: Blood Ctx 11/18 - Negative Trach Asp 11/18 - Oral flora  ANTIBIOTICS: Vancomycin 11/18 - 11/24 Levaquin 11/18 - 11/24  SUBJECTIVE: better  VITAL SIGNS: Temp:  [98 F (36.7 C)-98.5 F (36.9 C)] 98.4 F (36.9  C) (11/27 0750) Pulse Rate:  [57-69] 66 (11/27 0600) Resp:  [13-19] 17 (11/27 0600) BP: (78-170)/(50-79) 136/75 mmHg (11/27 0600) SpO2:  [90 %-100 %] 97 % (11/27 0822) Weight:  [129 lb 6.6 oz (58.7 kg)] 129 lb 6.6 oz (58.7 kg) (11/27 0700) HEMODYNAMICS: CVP:  [6 mmHg-10 mmHg] 7 mmHg VENTILATOR SETTINGS:   INTAKE / OUTPUT:  Intake/Output Summary (Last 24 hours) at 03/12/15 1020 Last data filed at 03/12/15 0700  Gross per 24 hour  Intake  611.5 ml  Output   1400 ml  Net -788.5 ml    PHYSICAL EXAMINATION: General:  Awake. Alert. No acute distress. Husband at bedside. Laughing and cooperative Integument:  Warm & dry. No rash on exposed skin. Left IJ in place. HEENT:  No scleral injection or icterus. PERRL. Cardiovascular:  Regular rate & NSR on telemetry. No edema. No appreciable JVD. On amio Pulmonary:  Coarse breath sounds bilaterally. Symmetric chest wall expansion. Normal work of breathing on nasal cannula oxygen. No distress. Mild congestion Abdomen: Soft. Hypoactive bowel sounds. Nondistended.  Neurological:  Grossly nonfocal. Oriented to person, place, time, & situation. CN grossly in tact.  PULMONARY  Recent Labs Lab 03/05/15 1120 03/06/15 0342 03/10/15 0039  PHART 7.341* 7.318* 7.524*  PCO2ART 56.5* 64.3* 40.9  PO2ART 68.0* 72.2* 61.2*  HCO3 30.5* 32.1* 33.5*  TCO2 32 34.0 34.7  O2SAT 92.0 93.8 92.2    CBC  Recent Labs Lab 03/10/15 0404 03/11/15 0416 03/12/15 0430  HGB 11.7* 12.1 11.5*  HCT 36.8 36.5 35.9*  WBC 12.4* 16.1* 19.6*  PLT 285 380 463*   CARDIAC    Recent Labs Lab 03/09/15 0540  03/09/15 1045 03/09/15 1807  TROPONINI 0.06* 0.08* 0.10*    CHEMISTRY  Recent Labs Lab 03/07/15 0338 03/08/15 0345 03/09/15 0410 03/10/15 0404 03/10/15 1507 03/11/15 0415 03/12/15 0430  NA 140 140 138 139 140 140 142  K 4.8 3.9 3.4* 2.9* 3.5 3.2* 3.5  CL 96* 95* 95* 98* 96* 98* 99*  CO2 37* 36* 37* 36* 35* 35* 34*  GLUCOSE 158* 158* 147* 96 103*  88 100*  BUN 26* 19 18 19 19  24* 22*  CREATININE 0.57 0.36* 0.47 0.51 0.50 0.66 0.70  CALCIUM 9.0 8.7* 8.3* 8.3* 8.5* 8.4* 8.6*  MG 2.2 1.8 2.0  --  2.1 2.3 2.3  PHOS 3.3 2.0* 3.4  --   --  4.5 3.6   Estimated Creatinine Clearance: 59.7 mL/min (by C-G formula based on Cr of 0.7).   LIVER  Recent Labs Lab 03/07/15 0338 03/08/15 0345 03/09/15 0410 03/11/15 0415 03/12/15 0430  ALBUMIN 2.6* 2.2* 2.1* 2.4* 2.6*     INFECTIOUS No results for input(s): LATICACIDVEN, PROCALCITON in the last 168 hours.   ENDOCRINE CBG (last 3)   Recent Labs  03/10/15 1617 03/10/15 2125 03/11/15 0718  GLUCAP 97 89 77     IMAGING x48h Dg Chest Port 1 View  03/12/2015  CLINICAL DATA:  Bypass graft 03/02/2015 EXAM: PORTABLE CHEST 1 VIEW COMPARISON:  03/10/2015 FINDINGS: LEFT central venous line unchanged. Stable cardiac silhouette. There is chronic bronchitic markings. Small effusions. No pneumothorax. Hyperinflated lungs. IMPRESSION: 1. Bilateral pleural effusions slightly improved. 2. Hyperinflated lungs. Electronically Signed   By: Genevive BiStewart  Edmunds M.D.   On: 03/12/2015 07:32     ASSESSMENT / PLAN:  PULMONARY ETT 11/17 - 11/24 (self-extubated) A:  Acute Hypoxic Respiratory Failure -  Secondary to acute pulmonary edema and HCAP. Self Extubated 10/24 @ 10pm. Wheezing w/ Long h/o Tobacco Use - Probable COPD with exacerbation. Chronic Hypercarbic Respiratory Failure pulm edema, effusions  P:   Continue nasal cannula oxygen to maintain FiO2 >94%. Duoneb q6hr Pulmicort 0.5mg  neb bid Intermittent CXR  CARDIOVASCULAR Lt IJ CVL 11/17 >> A:  Hypertrophic Cardiomyopathy with Cardiogenic Shock - Shock resolved. Complicated by sedative effects. A fib with RVR - Recurred AM 11/23 & converted back to NS on Amio gtt. Previously resolved on 11/20. S/P Right Fem-Pop Bypass. H/O HTN & HLD.  P:  Cardiology following Amiodarone bid Continue heparin gtt Avoiding  tachycardia/hypovolemia Post-op care per [facility] ASA 324mg  via tube daily Crestor 5mg  via tube qhs Lasix dc on 11/27, poor urine output Bystolic 5mg  daily Norvasc 5mg  daily, dry, cvp 6 Hold outpt cozaar  RENAL Lab Results  Component Value Date   CREATININE 0.70 03/12/2015   CREATININE 0.66 03/11/2015   CREATININE 0.50 03/10/2015    A:   Hypokalemia - Replaced Hypophosphatemia - Resolved. Hypomagnesemia - Resolved.  P:   Trending UOP with foley catheter Monitoring renal function with daily BUN/Creatinine Dc lasix 11/27  GASTROINTESTINAL A:  Nutrition - i Constipation   P:   Advance diet as tolerated Protonix PO. [place]ot bid Dulcolax prn  HEMATOLOGIC A:   Leukocytosis - Previously resolved. Now mild. Anemia - Mild. No signs of active bleeding.  Recent Labs  03/11/15 0416 03/12/15 0430  HGB 12.1 11.5*    P:  Trending Hgb & Leukocytosis w/ daily CBC D/C SCDs Heparin gtt per pharmacy protocol  INFECTIOUS A:  HCAP. H/O PCN allergy.  P:   Completed 7 day course of Levaquin & Vancomycin Plan to re-culture for any fever Trending  mild leukocytosis  ENDOCRINE CBG (last 3)   Recent Labs  03/10/15 1617 03/10/15 2125 03/11/15 0718  GLUCAP 97 89 77     A:   Hyperglycemia -Off tube feedings taking diet. No h/o DM.  P:   Changing accuchecks to qAC & HS Low dose SSI coverage  NEUROLOGIC A:  Delirium. Resolved) H/O Insomnia & Anxiety.  P:   RASS goal: 0  Wellbutrin  bid Ultram prn pain D/C Precedex, Versed, & Fentanyl Holding outpt Ambien for now  FAMILY: Husband at bedside this morning (11/27) and updated at length. He's happy with progress.  TODAY'S SUMMARY: 69 year old female status post femoropopliteal bypass. Now in NSR after starting amiodarone drip. Looks great after self extubation.Off drips, alert and cooperative. No acute distress. Ready for tele and  primary MD agrees.   Brett Canales Minor ACNP [facility] PCCM Pager [phone]  till 3 pm If no answer page [phone] 03/12/2015, 10:20 AM    STAFF NOTE: I, Rory Percy, MD FACP have personally reviewed patient's available data, including medical history, events of note, physical examination and test results as part of my evaluation. I have discussed with resident/NP and other care providers such as pharmacist, RN and RRT. In addition, I personally evaluated patient and elicited key findings of: lungs improved, pcxr major improved, etiology  May have had major contribution from atx in addition to edema, lasix was neg again 1 liter, for her small muscle mass her crt is an issue, consider dc lasix today, want to dc line neck, k supp, WBC is likely hemoconcentration , to floor agree, no role [place] / thora with improvements noted above   Mcarthur Rossetti. Tyson Alias, MD, FACP Pgr: [phone] [facility] 03/12/2015 11:42 AM

## 2015-03-12 NOTE — Progress Notes (Signed)
ANTICOAGULATION CONSULT NOTE - Follow Up Consult  Pharmacy Consult for Heparin  Indication: atrial fibrillation   Patient Measurements: Height: 5\' 5"  (165.1 cm) Weight: 129 lb 6.6 oz (58.7 kg) IBW/kg (Calculated) : 57  Vital Signs: Temp: 98.4 F (36.9 C) (11/27 0750) Temp Source: Oral (11/27 0750) BP: 136/75 mmHg (11/27 0600) Pulse Rate: 66 (11/27 0600)  Labs:  Recent Labs  03/09/15 1807  03/10/15 0404 03/10/15 0544 03/10/15 1507 03/11/15 0415 03/11/15 0416 03/12/15 0430  HGB  --   < > 11.7*  --   --   --  12.1 11.5*  HCT  --   --  36.8  --   --   --  36.5 35.9*  PLT  --   --  285  --   --   --  380 463*  HEPARINUNFRC  --   < >  --  0.49  --   --  0.44 0.33  CREATININE  --   < > 0.51  --  0.50 0.66  --  0.70  TROPONINI 0.10*  --   --   --   --   --   --   --   < > = values in this interval not displayed.  Estimated Creatinine Clearance: 59.7 mL/min (by C-G formula based on Cr of 0.7).  Assessment:  AC: s/p R fem-pop bypass on 11/17. 11/23 Developed acute post-op respiratory failure. AFib with RVR; IV heparin started on 11/23. Converted to SR 11/23 pm.  Continues in NSR this am on po amiodarone and IV heparin. Heparin continues to be at goal. No bleeding issues noted, cbc trended down overnight. No rate changes planned for today will continue to follow [facility] once [facility].  Goal of Therapy:  Heparin level 0.3-0.7 units/ml Monitor platelets by anticoagulation protocol: Yes   Plan:  -Continue heparin at 1150 units/hr -HL with AM labs  Sheppard CoilFrank Sylvi Rybolt PharmD., BCPS Clinical Pharmacist Pager [phone] 03/12/2015 10:46 AM

## 2015-03-12 NOTE — Progress Notes (Signed)
VASCULAR LAB PRELIMINARY  ARTERIAL  ABI completed:    RIGHT    LEFT    PRESSURE WAVEFORM  PRESSURE WAVEFORM  BRACHIAL 145 Triphasic BRACHIAL 142 Triphasic  DP 146 Triphasic DP 79 Monophasic  PT 134 Triphasic PT 89 Monophasic    RIGHT LEFT  ABI 1.01 0.61   ABIs indicate normal arterial flow on the right at rest post operative. Left ABI indicates a moderate reduction in arterial flow at rest.   Aishwarya Shiplett, RVS 03/12/2015, 5:00 PM

## 2015-03-13 ENCOUNTER — Inpatient Hospital Stay (HOSPITAL_COMMUNITY): Payer: Medicare Other

## 2015-03-13 DIAGNOSIS — I4891 Unspecified atrial fibrillation: Secondary | ICD-10-CM | POA: Insufficient documentation

## 2015-03-13 DIAGNOSIS — I480 Paroxysmal atrial fibrillation: Secondary | ICD-10-CM | POA: Insufficient documentation

## 2015-03-13 DIAGNOSIS — I10 Essential (primary) hypertension: Secondary | ICD-10-CM | POA: Insufficient documentation

## 2015-03-13 LAB — CBC WITH DIFFERENTIAL/PLATELET
BASOS PCT: 0 %
Basophils Absolute: 0 10*3/uL (ref 0.0–0.1)
Basophils Absolute: 0 10*3/uL (ref 0.0–0.1)
Basophils Relative: 0 %
EOS ABS: 0 10*3/uL (ref 0.0–0.7)
EOS ABS: 0 10*3/uL (ref 0.0–0.7)
EOS PCT: 0 %
EOS PCT: 0 %
HCT: 32.5 % — ABNORMAL LOW (ref 36.0–46.0)
HCT: 34 % — ABNORMAL LOW (ref 36.0–46.0)
HEMOGLOBIN: 11.1 g/dL — AB (ref 12.0–15.0)
Hemoglobin: 10.6 g/dL — ABNORMAL LOW (ref 12.0–15.0)
LYMPHS PCT: 14 %
Lymphocytes Relative: 12 %
Lymphs Abs: 2.2 10*3/uL (ref 0.7–4.0)
Lymphs Abs: 2.5 10*3/uL (ref 0.7–4.0)
MCH: 30.5 pg (ref 26.0–34.0)
MCH: 30.4 pg (ref 26.0–34.0)
MCHC: 32.6 g/dL (ref 30.0–36.0)
MCHC: 32.6 g/dL (ref 30.0–36.0)
MCV: 93.4 fL (ref 78.0–100.0)
MCV: 93.2 fL (ref 78.0–100.0)
MONO ABS: 1.3 10*3/uL — AB (ref 0.1–1.0)
Monocytes Absolute: 1.1 10*3/uL — ABNORMAL HIGH (ref 0.1–1.0)
Monocytes Relative: 7 %
Monocytes Relative: 6 %
NEUTROS PCT: 81 %
NEUTROS PCT: 80 %
Neutro Abs: 15 10*3/uL — ABNORMAL HIGH (ref 1.7–7.7)
Neutro Abs: 14.2 10*3/uL — ABNORMAL HIGH (ref 1.7–7.7)
PLATELETS: 455 10*3/uL — AB (ref 150–400)
Platelets: 443 10*3/uL — ABNORMAL HIGH (ref 150–400)
RBC: 3.48 MIL/uL — AB (ref 3.87–5.11)
RBC: 3.65 MIL/uL — ABNORMAL LOW (ref 3.87–5.11)
RDW: 15.1 % (ref 11.5–15.5)
RDW: 15 % (ref 11.5–15.5)
WBC: 18.5 10*3/uL — AB (ref 4.0–10.5)
WBC: 17.8 10*3/uL — ABNORMAL HIGH (ref 4.0–10.5)

## 2015-03-13 LAB — RENAL FUNCTION PANEL
ANION GAP: 8 (ref 5–15)
Albumin: 2.6 g/dL — ABNORMAL LOW (ref 3.5–5.0)
BUN: 23 mg/dL — ABNORMAL HIGH (ref 6–20)
CALCIUM: 8.5 mg/dL — AB (ref 8.9–10.3)
CHLORIDE: 102 mmol/L (ref 101–111)
CO2: 33 mmol/L — ABNORMAL HIGH (ref 22–32)
CREATININE: 0.63 mg/dL (ref 0.44–1.00)
Glucose, Bld: 99 mg/dL (ref 65–99)
Phosphorus: 3.2 mg/dL (ref 2.5–4.6)
Potassium: 3.1 mmol/L — ABNORMAL LOW (ref 3.5–5.1)
Sodium: 143 mmol/L (ref 135–145)

## 2015-03-13 LAB — MAGNESIUM: Magnesium: 2.2 mg/dL (ref 1.7–2.4)

## 2015-03-13 MED ORDER — POTASSIUM CHLORIDE 20 MEQ/15ML (10%) PO SOLN
40.0000 meq | Freq: Two times a day (BID) | ORAL | Status: AC
  Administered 2015-03-13: 40 meq via ORAL
  Filled 2015-03-13 (×2): qty 30

## 2015-03-13 NOTE — Progress Notes (Signed)
Pt fell at 2105, pt husband recently left the room and when he returned he found the pt on the floor, pt had call bell within reach at time of fall but did not use despite being educated about use of call bell and importance of nursing staff to assist with mobility, bed alarm was not in use due to family presence at the bedside, family did not notify nursing staff that they were leaving room so bed alarm could be re-activated in their absence, pt previously requested staff to keep all four bed rails raised, pt reported that she became cold and attempted to get up to get an extra blanket across the room, pt admitted that she slid down to the bottom of the bed to get up between the lower bed rail and the foot board in order to get up, pt reports that when she attempted to get up she lost her balance and fell, pt reports that she hit her head but denies any pain in her head, a headache, or pain on any other part of her body, pt vital signs WDL, physical and neurological assessment performed and are WDL, pt did have a minor injury and has a skin tear on her right posterior elbow with no drainage, small foam dressing applied, pt demonstrates full range of motion of RUE and denies pain of her arm or elbow, on-call MD notified (Dr.  Myra GianottiBrabham), no new orders at this time, RN will continue to monitor pt for signs or symptoms of bleeding due to recent anticoagulation, bed alarm activated, fall risk bracelet and yellow high fall risk socks applied, only three bed rails of bed are raised at this time, pt and family have been educated again about how to use the call bell to contact nursing staff for mobility needs, pt and family demonstrate and verbalize understanding of call bell use, RN will continue to monitor pt, pt call bell in reach and bed in lowest position.  Mila PalmerJohnson,Myiesha Edgar A, RN 03/12/2015

## 2015-03-13 NOTE — Discharge Instructions (Signed)
Information on my medicine - ELIQUIS (apixaban)  Why was Eliquis prescribed for you? Eliquis was prescribed for you to reduce the risk of forming blood clots.  What do You need to know about Eliquis ? Take your Eliquis TWICE DAILY - one tablet in the morning and one tablet in the evening with or without food.  It would be best to take the doses about the same time each day.  If you have difficulty swallowing the tablet whole please discuss with your pharmacist how to take the medication safely.  Take Eliquis exactly as prescribed by your doctor and DO NOT stop taking Eliquis without talking to the doctor who prescribed the medication.  Stopping may increase your risk of developing a new clot or stroke.  Refill your prescription before you run out.  After discharge, you should have regular check-up appointments with your healthcare provider that is prescribing your Eliquis.  In the future your dose may need to be changed if your kidney function or weight changes by a significant amount or as you get older.  What do you do if you miss a dose? If you miss a dose, take it as soon as you remember on the same day and resume taking twice daily.  Do not take more than one dose of ELIQUIS at the same time.  Important Safety Information A possible side effect of Eliquis is bleeding. You should call your healthcare provider right away if you experience any of the following: Bleeding from an injury or your nose that does not stop. Unusual colored urine (red or dark brown) or unusual colored stools (red or black). Unusual bruising for unknown reasons. A serious fall or if you hit your head (even if there is no bleeding).  Some medicines may interact with Eliquis and might increase your risk of bleeding or clotting while on Eliquis. To help avoid this, consult your healthcare provider or pharmacist prior to using any new prescription or non-prescription medications, including herbals, vitamins,  non-steroidal anti-inflammatory drugs (NSAIDs) and supplements.  This website has more information on Eliquis (apixaban): www.Eliquis.com.   

## 2015-03-13 NOTE — Progress Notes (Signed)
PULMONARY / CRITICAL CARE MEDICINE   Name: Beverly Li MRN: [id number] DOB: 1945-10-18    ADMISSION DATE:  03/02/2015 CONSULTATION DATE:  03/02/2015  REFERRING MD :  Dr Edilia Bo [facility]  CHIEF COMPLAINT:  Acute post op resp failure  BRIEF: 69 year old female with baseline claudication but no dyspnea but has some HOCM (followed by Dr Anne Fu) but at baseline and has had a normal cardiac stress test and is able to climb stairs without dyspnea. He underwent on 03/02/2015 right below knee popliteal artery femoropopliteal bypass and femoral artery endarterectomy on the right. Overall operative time was approximately 4 hours according to anesthesia. First 2 hours no problem but subsequently started having severe evidence of hypertrophic obstructive cardiomyopathy as evidenced by intraoperative echo and periods of hypotension. Status post 4-5 liters of fluid and for albumin infusions. Brought into the intensive care unit postoperatively and was spontaneously weaned off Neo-Synephrine but was found to be in respiratory acidosis with severe hypoxemia and chest x-ray with acute pulmonary edema bilaterally. Pulmonary critical care called for consultation 03/02/15  EVENTS: 11/17 - Admit to hospital 11/17 - Right below knee Fem-Pop bypass w/ femoral artery endarterectomy on right 11/18 - Developed fever & antibiotics started for empiric coverage 11/20 - A fib w/ RVR & converted to NSR 11/23 - A fib w/ RVR again started on Amio gtt 11/24 - Converted back to NSR & Self-extubated.  STUDIES: TTE 11/19 - EF 65-70% w/ moderate LVH. Grade 2 diastolic dysfunction. Left pleural effusion. EKG 11/24 - Sinus brady. QTc . Port CXR 11/25 - Bilateral lower lung opacities unchanged.  MICROBIOLOGY: Blood Ctx 11/18 - Negative Trach Asp 11/18 - Oral flora  ANTIBIOTICS: Vancomycin 11/18 - 11/24 Levaquin 11/18 - 11/24  SUBJECTIVE: Feels better.  VITAL SIGNS: Temp:  [97.8 F (36.6 C)-98.3 F (36.8 C)] 98.3  F (36.8 C) (11/28 0457) Pulse Rate:  [58-73] 64 (11/28 0457) Resp:  [14-21] 18 (11/28 0457) BP: (90-156)/(53-81) 131/66 mmHg (11/28 0457) SpO2:  [91 %-99 %] 97 % (11/28 0457) HEMODYNAMICS: CVP:  [1 mmHg-6 mmHg] 2 mmHg VENTILATOR SETTINGS:   INTAKE / OUTPUT:  Intake/Output Summary (Last 24 hours) at 03/13/15 1191 Last data filed at 03/12/15 1400  Gross per 24 hour  Intake    369 ml  Output    575 ml  Net   -206 ml    PHYSICAL EXAMINATION: General:  Awake, Alert, No distress Integument:  Warm & dry. No rash on exposed skin.  HEENT:  No scleral injection or icterus. PERRL. Cardiovascular:  RRR. No MRG Pulmonary:  Coarse breath sounds bilaterally. Symmetric chest wall expansion. Normal work of breathing on nasal cannula oxygen. No distress.  Abdomen: Soft. Hypoactive bowel sounds. Nondistended.  Neurological:  No focal deficits.  PULMONARY  Recent Labs Lab 03/10/15 0039  PHART 7.524*  PCO2ART 40.9  PO2ART 61.2*  HCO3 33.5*  TCO2 34.7  O2SAT 92.2    CBC  Recent Labs Lab 03/12/15 0430 03/13/15 0230 03/13/15 0424  HGB 11.5* 10.6* 11.1*  HCT 35.9* 32.5* 34.0*  WBC 19.6* 18.5* 17.8*  PLT 463* 455* 443*   CARDIAC    Recent Labs Lab 03/09/15 0540 03/09/15 1045 03/09/15 1807  TROPONINI 0.06* 0.08* 0.10*    CHEMISTRY  Recent Labs Lab 03/08/15 0345 03/09/15 0410 03/10/15 0404 03/10/15 1507 03/11/15 0415 03/12/15 0430 03/13/15 0230  NA 140 138 139 140 140 142 143  K 3.9 3.4* 2.9* 3.5 3.2* 3.5 3.1*  CL 95* 95* 98* 96* 98* 99*  102  CO2 36* 37* 36* 35* 35* 34* 33*  GLUCOSE 158* 147* 96 103* 88 100* 99  BUN 24* 22* 23*  CREATININE 0.36* 0.47 0.51 0.50 0.66 0.70 0.63  CALCIUM 8.7* 8.3* 8.3* 8.5* 8.4* 8.6* 8.5*  MG 1.8 2.0  --  2.1 2.3 2.3 2.2  PHOS 2.0* 3.4  --   --  4.5 3.6 3.2   Estimated Creatinine Clearance: 59.7 mL/min (by C-G formula based on Cr of 0.63).   LIVER  Recent Labs Lab 03/08/15 0345 03/09/15 0410 03/11/15 0415  03/12/15 0430 03/13/15 0230  ALBUMIN 2.2* 2.1* 2.4* 2.6* 2.6*     INFECTIOUS No results for input(s): LATICACIDVEN, PROCALCITON in the last 168 hours.   ENDOCRINE CBG (last 3)   Recent Labs  03/10/15 1617 03/10/15 2125 03/11/15 0718  GLUCAP 97 89 77     IMAGING x48h Dg Chest Port 1 View  03/13/2015  CLINICAL DATA:  Respiratory failure, bypass grafting. EXAM: PORTABLE CHEST 1 VIEW COMPARISON:  03/12/2015. FINDINGS: Trachea is midline. Heart is enlarged, stable. Thoracic aorta is calcified. Left IJ central line has been removed. There is bibasilar airspace opacification, right greater than left, increased on the right from yesterday's exam. Small bilateral effusions. No pneumothorax. IMPRESSION: Bibasilar airspace opacification and small bilateral effusions, right greater than left, with interval worsening on the right from 03/12/2015. Electronically Signed   By: Leanna Battles M.D.   On: 03/13/2015 07:32   Dg Chest Port 1 View  03/12/2015  CLINICAL DATA:  Bypass graft 03/02/2015 EXAM: PORTABLE CHEST 1 VIEW COMPARISON:  03/10/2015 FINDINGS: LEFT central venous line unchanged. Stable cardiac silhouette. There is chronic bronchitic markings. Small effusions. No pneumothorax. Hyperinflated lungs. IMPRESSION: 1. Bilateral pleural effusions slightly improved. 2. Hyperinflated lungs. Electronically Signed   By: Genevive Bi M.D.   On: 03/12/2015 07:32     ASSESSMENT / PLAN:  PULMONARY ETT 11/17 - 11/24 (self-extubated) A:  Acute Hypoxic Respiratory Failure -  Secondary to acute pulmonary edema and HCAP. Self Extubated 10/24 @ 10pm. Wheezing w/ Long h/o Tobacco Use - Probable COPD with exacerbation. Chronic Hypercarbic Respiratory Failure pulm edema, effusions  P:   Continue nasal cannula oxygen to maintain FiO2 >94%. Duoneb q6hr Pulmicort 0.5mg  neb bid Intermittent CXR  CARDIOVASCULAR Lt IJ CVL 11/17 >> A:  Hypertrophic Cardiomyopathy with Cardiogenic Shock - Shock  resolved. Complicated by sedative effects. A fib with RVR - Recurred AM 11/23 & converted back to NS on Amio gtt. Previously resolved on 11/20. S/P Right Fem-Pop Bypass. H/O HTN & HLD.  P:  Cardiology following Amiodarone bid On Eliquis anticoagulation ASA  PO Crestor  via tube qhs Bystolic  daily Norvasc  daily, dry, cvp 6 Hold outpt cozaar  RENAL Lab Results  Component Value Date   CREATININE 0.63 03/13/2015   CREATININE 0.70 03/12/2015   CREATININE 0.66 03/11/2015    A:   Hypokalemia - Replaced Hypophosphatemia - Resolved. Hypomagnesemia - Resolved.  P:   Trending UOP with foley catheter Monitoring renal function with daily BUN/Creatinine CXR worse today. Consider restarting lasix.   GASTROINTESTINAL A:  Nutrition - i Constipation   P:   Advance diet as tolerated Protonix PO. Sennakot bid Dulcolax prn  HEMATOLOGIC A:   Leukocytosis - Previously resolved. Now mild. Anemia - Mild. No signs of active bleeding.  Recent Labs  03/13/15 0230 03/13/15 0424  HGB 10.6* 11.1*    P:  Trending Hgb & Leukocytosis w/ daily CBC  INFECTIOUS A:  HCAP. H/O PCN allergy.  P:   Completed 7 day course of Levaquin & Vancomycin Plan to re-culture for any fever Trending mild leukocytosis  ENDOCRINE CBG (last 3)   Recent Labs  03/10/15 1617 03/10/15 2125 03/11/15 0718  GLUCAP 97 89 77     A:   Hyperglycemia -Off tube feedings taking diet. No h/o DM.  P:   Changing accuchecks to qAC & HS Low dose SSI coverage  NEUROLOGIC A:  Delirium. Resolved) H/O Insomnia & Anxiety.  P:   RASS goal: 0  Wellbutrin 150mg  bid Ultram prn pain Holding outpt Ambien for now  FAMILY: Husband at bedside this morning (11/28) and updated at length. He's happy with progress.  TODAY'S SUMMARY: 69 year old female status post femoropopliteal bypass. Transferred from ICU. Slow improvement.  PCCM will sign off. Please call with any new  questions.   Chilton GreathousePraveen Halle Davlin MD [facility] Pager [phone] If no answer or after 3pm call: [phone] 03/13/2015, 8:32 AM

## 2015-03-13 NOTE — Progress Notes (Addendum)
[facility]  Subjective  - Doing well.  She ambulated yesterday.  Objective 131/66 64 98.3 F (36.8 C) (Oral) 18 97%  Intake/Output Summary (Last 24 hours) at 03/13/15 0725 Last data filed at 03/12/15 1400  Gross per 24 hour  Intake  490.5 ml  Output    775 ml  Net -284.5 ml   Palpable right DP, left foot warm active range of motion intact well perfused Incision soft without erythema Heart irregular  Lungs 6 L [place] O2  ABI completed:    RIGHT    LEFT    PRESSURE WAVEFORM  PRESSURE WAVEFORM  BRACHIAL 145 Triphasic BRACHIAL 142 Triphasic  DP 146 Triphasic DP 79 Monophasic  PT 134 Triphasic PT 89 Monophasic    RIGHT LEFT  ABI 1.01 0.61         Assessment/Planning: POD # 11 right femoral to below-knee popliteal bypass with composite vein graft  Ambulate, remove staples every other one  Wean off O 2 as she tolerates Afib/cardiomyopathy managed by cardiology   Thomasena EdisCOLLINS, [facility] HospitalMAUREEN 03/13/2015 7:25 AM --  Laboratory Lab Results:  Recent Labs  03/13/15 0230 03/13/15 0424  WBC 18.5* 17.8*  HGB 10.6* 11.1*  HCT 32.5* 34.0*  PLT 455* 443*   BMET  Recent Labs  03/12/15 0430 03/13/15 0230  NA 142 143  K 3.5 3.1*  CL 99* 102  CO2 34* 33*  GLUCOSE 100* 99  BUN 22* 23*  CREATININE 0.70 0.63  CALCIUM 8.6* 8.5*    Agree with above. Hopefully will work with physical therapy today.  Home when ambulating safely, off  O2, and stable from a cardiac standpoint.  Waverly Ferrarihristopher Chrislynn Mosely, MD, FACS Beeper [phone] Office: [phone]

## 2015-03-13 NOTE — Progress Notes (Signed)
Pt is a very high fall risk and had a recent fall last night (03/12/15), bed alarm is in use, pt and family have been educated about using call bell to notify staff of mobility needs and use of bed alarm regardless of family presence at the bedside in order to increase patient safety, pt bed alarmed this morning and when staff entered room pt was attempting to get up alone with husband in room, RN and nursing staff reiterated need for pt to call for assistance before trying to get out of bed, pt verbalized understanding but may need reinforcement, after toileting pt requested to sit in chair, pt informed that a chair alarm must be in use and that she needs to lay in bed with bed alarm activated before staff can retrieve chair alarm, pt refused to lay down but bed alarm activated, nursing staff assisted pt to chair and placed chair alarm, chair alarm in use, pt call bell in reach, husband at bedside, RN will continue to monitor.  Mila PalmerJohnson,Aretta Stetzel A, RN 03/13/2015

## 2015-03-13 NOTE — Care Management Important Message (Signed)
Important Message  Patient Details  Name: Beverly Li MRN: [id number] Date of Birth: 1945/07/24   Medicare Important Message Given:  Yes    Kyla BalzarineShealy, Anouk Critzer [place]ena 03/13/2015, 3:07 PM

## 2015-03-13 NOTE — Progress Notes (Signed)
Patient Name: Beverly Li Date of Encounter: 03/13/2015  Active Problems:   PAD (peripheral artery disease) (HCC)   Acute respiratory failure with hypoxia (HCC)   Encounter for central line placement   Hypertrophic obstructive cardiomyopathy (HCC)   Demand ischemia (HCC)   Hypophosphatemia   Acute on chronic diastolic CHF (congestive heart failure), NYHA class 4 (HCC)   HCAP (healthcare-associated pneumonia)   S/P femoral-popliteal bypass surgery   Hypokalemia   Primary Cardiologist: Dr. Anne Fu  Patient Profile: 69 yo female w/ PMH of hypertrophic cardiomyopathy, HTN, and PAD admitted for right fem-pop bypass on 03/02/2015 who developed hemodynamic instability while in the OR. Echo at that time showed a dynamic outflow gradient. Cardiology consulted for afib. She self extubated on 11/24.   SUBJECTIVE:   Feeling good this morning. Throat a little sore. States she always had L eyelid and L facial droop since her Bell's Palsy many years ago.   OBJECTIVE Filed Vitals:   03/12/15 2013 03/12/15 2110 03/13/15 0211 03/13/15 0457  BP: 139/65 90/53  131/66  Pulse: 61 73  64  Temp: 98.1 F (36.7 C)   98.3 F (36.8 C)  TempSrc: Oral   Oral  Resp: Height:      Weight:      SpO2: 99% 94% 94% 97%    Intake/Output Summary (Last 24 hours) at 03/13/15 1053 Last data filed at 03/12/15 1400  Gross per 24 hour  Intake    206 ml  Output    575 ml  Net   -369 ml   Filed Weights   03/10/15 0500 03/11/15 0700 03/12/15 0700  Weight: 140 lb 10.5 oz (63.8 kg) 132 lb 7.9 oz (60.1 kg) 129 lb 6.6 oz (58.7 kg)    PHYSICAL EXAM General: Well developed, well nourished, female. Head: Normocephalic, atraumatic. 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 Lungs: Diminished breath sound in bilateral bases. No obvious rale or rhonchi. Heart: RRR, S1, S2, no S3, S4, 2/6 SEM at LLSB; no rub. Abdomen: soft,  nondistended, nontender. Extremities: No clubbing, cyanosis, or edema. Distal pedal pulses are 2+ bilaterally. Neuro: Follows command, move all extremities.   LABS: CBC:  Recent Labs  03/13/15 0230 03/13/15 0424  WBC 18.5* 17.8*  NEUTROABS 15.0* 14.2*  HGB 10.6* 11.1*  HCT 32.5* 34.0*  MCV 93.4 93.2  PLT 455* 443*   Basic Metabolic Panel:  Recent Labs  81/19/14 0430 03/13/15 0230  NA 142 143  K 3.5 3.1*  CL 99* 102  CO2 34* 33*  GLUCOSE 100* 99  BUN 22* 23*  CREATININE 0.70 0.63  CALCIUM 8.6* 8.5*  MG 2.3 2.2  PHOS 3.6 3.2   TELE:   NSR with rate in 80's - 90's.      ECHO: 03/04/2015 Study Conclusions - Left ventricle: The cavity size was normal. Wall thickness was increased in a pattern of moderate LVH. Systolic function was vigorous. The estimated ejection fraction was in the range of 65% to 70%. Wall motion was normal; there were no regional wall motion abnormalities. Features are consistent with a pseudonormal left ventricular filling pattern, with concomitant abnormal relaxation and increased filling pressure (grade 2 diastolic dysfunction). - Aortic valve: Mildly to moderately calcified annulus. - Left atrium: The atrium was mildly dilated. - Pericardium, extracardiac: There was a left pleural effusion.  Radiology/Studies: Dg Chest Port 1 View: 03/06/2015  CLINICAL DATA:  69 year old female status post right-sided fem-pop bypass graft and right femoral endarterectomy. History of claudication. EXAM: PORTABLE CHEST 1 VIEW COMPARISON:  Chest x-ray 03/05/2015. FINDINGS: An endotracheal tube is in place with tip 2.8 cm above the carina. There is a left-sided internal jugular central venous catheter with tip terminating in the distal superior vena cava. A nasogastric tube is seen extending into the stomach, however, the tip of the nasogastric tube extends below the lower margin of the image. Lung volumes are low. Extensive opacities throughout the mid  to lower lungs bilaterally may reflect areas of atelectasis and/or airspace consolidation. Moderate bilateral pleural effusions. Heart size is mildly enlarged. Cephalization of the pulmonary vasculature. The patient is rotated to the right on today's exam, resulting in distortion of the mediastinal contours and reduced diagnostic sensitivity and specificity for mediastinal pathology. Atherosclerosis in the thoracic aorta. IMPRESSION: 1. Support apparatus, as above. 2. The appearance the chest is very similar to the prior examination. Findings may simply reflect congestive heart failure, with extensive bibasilar atelectasis in the setting of moderate bilateral pleural effusions, however, underlying airspace consolidation throughout the mid to lower lungs from infectious etiologies or aspiration is not excluded. 3. Atherosclerosis. Electronically Signed   By: Trudie Reedaniel  Entrikin M.D.   On: 03/06/2015 07:16   Dg Chest Port 1 View: 03/05/2015  CLINICAL DATA:  Respiratory failure. EXAM: PORTABLE CHEST 1 VIEW COMPARISON:  03/04/2015 FINDINGS: Endotracheal tube, enteric catheter, left internal jugular approach central venous catheter stable. Cardiomediastinal silhouette is enlarged. Mediastinal contours appear intact. There is no evidence of pneumothorax. Bilateral lower lobe predominance airspace opacities are not significantly changed. Osseous structures are without acute abnormality. Soft tissues are grossly normal. IMPRESSION: No significant change in the aeration of the lungs with bilateral lower lobe airspace opacities, which may represent bilateral pleural effusions with atelectasis or airspace consolidation. Stable supporting lines and tubes. Electronically Signed   By: Ted Mcalpineobrinka  Dimitrova M.D.   On: 03/05/2015 09:05     Current Medications:  . amiodarone  400 mg Oral BID  . amLODipine  5 mg Oral Daily  . antiseptic oral rinse  7 mL Mouth Rinse BID  . apixaban  5 mg Oral BID  . aspirin EC  325 mg Oral Daily   . B-complex with vitamin C  1 tablet Oral Daily  . budesonide (PULMICORT) nebulizer solution  0.5 mg Nebulization BID  . buPROPion  150 mg Oral BID  . guaiFENesin  1,200 mg Oral BID  . ipratropium-albuterol  3 mL Nebulization Q6H  . multivitamin with minerals  1 tablet Oral QHS  . nebivolol  10 mg Oral Daily  . pantoprazole  40 mg Oral Daily  . potassium chloride  40 mEq Oral BID  . rosuvastatin  5 mg Oral QHS  . senna-docusate  1 tablet Oral BID  . sodium chloride  10-40 mL Intracatheter Q12H   . sodium chloride 10 mL/hr at 03/02/15 1427  . lactated ringers 10 mL/hr at 03/12/15 1100    ASSESSMENT AND PLAN:  1. Hypertrophic obstructive cardiomyopathy - known history of hypertrophic cardiomyopathy. On 03/02/2015 she developed hemodynamic instability while in the OR. Echo at that time showed a dynamic outflow gradient. - Continue to avoid beta agonist such as epinephrine, norepinephrine, dobutamine, dopamine - Avoid diuresis if possible as this would exacerbate HOCM physiology by decreasing preload.  - Trying to avoid excessive PEEP which would increase intrathoracic pressure which would then decrease preload and potentially worsen obstructive physiology. - diminished breath sound in  bilateral bases, but no obvious rale. Lasix stopped 2 days ago. Weight back to baseline of 129 lbs. Appears to be doing ok.  - patient wish to know if her grandkids need to test for HOCM, apparent both of her kids were already test. Per UpToDate (Hypertrophic cardiomyopathy is an autosomal dominant disorder, and most mutations have a high degree of penetrance. As a result, first-degree family members of an affected individual should be evaluated for possible inheritance of the disease. We do not recommend routine genetic screening of first-degree relatives unless a definite HCM-causing mutation has been identified in the index case.) Unfortunately, I am unable to find clear evidence if 2nd degree relative  need to be test.   2. PAF - Developed atrial fibrillation on 03/04/2015. Converted to NSR on 03/05/2015 on amiodarone. - IV amiodarone transitioned to PO amio on 03/09/2015, plan for 7 days of PO amio, transition to  BID PO amio after 7 days starting 03/16/2015.  - started on Eliquis. Will defer to vascular surgery regarding the need for high dose ASA on top of eliquis.   3. Elevated troponin - cyclic troponin values were 1.61, 0.04, and 0.06.  - Minimally elevated, flat, demand ischemia in the setting of hypertrophic obstructive cardiomyopathy and transient hypotension  4. Peripheral vascular disease - Status post femoropopliteal bypass - per Vascular Surgery  5. Hypomagnesemia - Resolved  6. HCAP - on Vancomycin and Levaquin - per Critical Care  7. VDRF - CCM managing vent.   Ramond Dial PA Pager: [phone]   I have seen, examined and evaluated the patient this AM along with Mr. Lisabeth Devoid, [place].  After reviewing all the available data and chart,  I agree with his findings, examination as well as impression recommendations.  I spent a long time talking with this very pleasant woman and her husband. She has a history of hypertrophic cardiomyopathy and was admitted for femoropopliteal bypass surgery. Unfortunately her operation was cut short so that she was only having successful bypass on 1 leg as post bilateral this was secondary to A. fib RVR with hypotension and presumed sepsis.  She now appears to be relatively stable. No longer in A. fib with telemetry showing sinus rhythm. Currently on amiodarone plus beta blocker -- I would continue the taper oral dosing as recommended (completing 1 week of 400 mg twice a day followed by 1 week of 200 mL twice a day and then 200 mg daily until seen by her primary cardiologist)  She is currently on ELIQUIS for stroke prevention. It does seem like her event was perioperative A. fib, however with cardiomyopathy she is at increased risk  of defibrillation, therefore agree with regulation on ELIQUIS.  Minor troponin elevations in the setting of A. fib RVR and hypotension with hypertrophic artery myopathy patient. This is simply demand ischemia.  No active heart failure symptoms. Would be very careful to avoid overdiuresis. I think she does not require any additional diuresis at this point.  She actually appears relatively stable from a cardiac standpoint and would be okay to be discharged from a cardiac standpoint unless something changes.   Marykay Lex, M.D., M.S. Interventional Cardiologist   Pager # [phone]

## 2015-03-13 NOTE — Progress Notes (Signed)
Physical Therapy Treatment Patient Details Name: Beverly MeekerFrances B Eimers MRN: [id number] DOB: June 25, 1945 Today's Date: 03/13/2015    History of Present Illness pt is a 69 y/o female with h/o HTN, afib and PVD, admitted with complaints of lifestyle limiting bil LE claudication.  Pt s/p R common femoral art endarerectomy and R fem-pop BPG.    PT Comments    Progressing steadily.  Saturation numbers confirm pt still needs supplemental oxygen.  Follow Up Recommendations  Home health PT     Equipment Recommendations   (TBA)    Recommendations for Other Services       Precautions / Restrictions Precautions Precautions: Fall Restrictions Weight Bearing Restrictions: No    Mobility  Bed Mobility Overal bed mobility: Needs Assistance Bed Mobility: Supine to Sit     Supine to sit: Min assist;HOB elevated        Transfers Overall transfer level: Needs assistance   Transfers: Sit to/from Stand Sit to Stand: Min guard         General transfer comment: cues for hand placement   Ambulation/Gait Ambulation/Gait assistance: Min guard Ambulation Distance (Feet): 380 Feet Assistive device: Rolling walker (2 wheeled) Gait Pattern/deviations: Step-through pattern Gait velocity: slower Gait velocity interpretation: Below normal speed for age/gender General Gait Details: more steady, but shakier as she fatigued   Stairs            Wheelchair Mobility    Modified Rankin (Stroke Patients Only)       Balance Overall balance assessment: Needs assistance   Sitting balance-Leahy Scale: Fair       Standing balance-Leahy Scale: Poor Standing balance comment: still reliant on the RW                    Cognition Arousal/Alertness: Awake/alert Behavior During Therapy: WFL for tasks assessed/performed Overall Cognitive Status: Within Functional Limits for tasks assessed                      Exercises      General Comments General comments (skin  integrity, edema, etc.): On arrival, pt on RA and pale.  SpO2 at 78% and HR in the 80's.  On 4L pt needed 4-5 minutes to return to 90% and then couldn't maintain the 90%.  With gait on 4L sats/EHR respectively were 86%/66 bpm, and 87%/68bpm.  After resting 2-3 min on 4L sats 90/91% and HR 64 bpm.      Pertinent Vitals/Pain Pain Assessment: No/denies pain    Home Living                      Prior Function            PT Goals (current goals can now be found in the care plan section) Acute Rehab PT Goals Patient Stated Goal: Home soon PT Goal Formulation: With patient Time For Goal Achievement: 03/25/15 Potential to Achieve Goals: Good Progress towards PT goals: Progressing toward goals    Frequency  Min 3X/week    PT Plan Current plan remains appropriate    Co-evaluation             End of Session Equipment Utilized During Treatment: Oxygen Activity Tolerance: Patient tolerated treatment well Patient left: in bed;with call bell/phone within reach;with family/visitor present     Time: 1440-1503 PT Time Calculation (min) (ACUTE ONLY): 23 min  Charges:  $Gait Training: 8-22 mins $Therapeutic Activity: 8-22 mins  G Codes:      Zuleima Haser, Eliseo Gum 03/13/2015, 3:14 PM 03/13/2015  [place] Bing, PT [phone]  (pager)

## 2015-03-14 LAB — CBC WITH DIFFERENTIAL/PLATELET
BASOS ABS: 0 10*3/uL (ref 0.0–0.1)
Basophils Relative: 0 %
EOS ABS: 0.2 10*3/uL (ref 0.0–0.7)
Eosinophils Relative: 1 %
HCT: 32.4 % — ABNORMAL LOW (ref 36.0–46.0)
Hemoglobin: 10.6 g/dL — ABNORMAL LOW (ref 12.0–15.0)
LYMPHS PCT: 13 %
Lymphs Abs: 2.2 10*3/uL (ref 0.7–4.0)
MCH: 30.5 pg (ref 26.0–34.0)
MCHC: 32.7 g/dL (ref 30.0–36.0)
MCV: 93.4 fL (ref 78.0–100.0)
MONO ABS: 0.9 10*3/uL (ref 0.1–1.0)
Monocytes Relative: 5 %
NEUTROS PCT: 81 %
Neutro Abs: 13.8 10*3/uL — ABNORMAL HIGH (ref 1.7–7.7)
PLATELETS: 438 10*3/uL — AB (ref 150–400)
RBC: 3.47 MIL/uL — AB (ref 3.87–5.11)
RDW: 15.3 % (ref 11.5–15.5)
WBC: 17.1 10*3/uL — AB (ref 4.0–10.5)

## 2015-03-14 LAB — RENAL FUNCTION PANEL
ALBUMIN: 2.9 g/dL — AB (ref 3.5–5.0)
Anion gap: 9 (ref 5–15)
BUN: 21 mg/dL — ABNORMAL HIGH (ref 6–20)
CO2: 30 mmol/L (ref 22–32)
Calcium: 8.6 mg/dL — ABNORMAL LOW (ref 8.9–10.3)
Chloride: 104 mmol/L (ref 101–111)
Creatinine, Ser: 0.67 mg/dL (ref 0.44–1.00)
GFR calc Af Amer: >60 mL/min (ref >60)
GLUCOSE: 94 mg/dL (ref 65–99)
PHOSPHORUS: 3.2 mg/dL (ref 2.5–4.6)
POTASSIUM: 3.3 mmol/L — AB (ref 3.5–5.1)
SODIUM: 143 mmol/L (ref 135–145)

## 2015-03-14 LAB — MAGNESIUM: MAGNESIUM: 2.2 mg/dL (ref 1.7–2.4)

## 2015-03-14 MED ORDER — LEVOFLOXACIN IN D5W 750 MG/150ML IV SOLN
750.0000 mg | INTRAVENOUS | Status: DC
  Administered 2015-03-15: 750 mg via INTRAVENOUS
  Filled 2015-03-14: qty 150

## 2015-03-14 MED ORDER — LEVOFLOXACIN IN D5W 500 MG/100ML IV SOLN
500.0000 mg | INTRAVENOUS | Status: DC
  Administered 2015-03-14: 500 mg via INTRAVENOUS
  Filled 2015-03-14: qty 100

## 2015-03-14 MED ORDER — LEVALBUTEROL HCL 0.63 MG/3ML IN NEBU
0.6300 mg | INHALATION_SOLUTION | Freq: Three times a day (TID) | RESPIRATORY_TRACT | Status: DC
  Administered 2015-03-14 – 2015-03-15 (×2): 0.63 mg via RESPIRATORY_TRACT
  Filled 2015-03-14 (×2): qty 3

## 2015-03-14 MED ORDER — FUROSEMIDE 20 MG PO TABS
20.0000 mg | ORAL_TABLET | Freq: Every day | ORAL | Status: DC
  Administered 2015-03-15: 20 mg via ORAL
  Filled 2015-03-14: qty 1

## 2015-03-14 MED ORDER — FUROSEMIDE 10 MG/ML IJ SOLN
40.0000 mg | Freq: Once | INTRAMUSCULAR | Status: AC
  Administered 2015-03-14: 40 mg via INTRAVENOUS
  Filled 2015-03-14: qty 4

## 2015-03-14 NOTE — Progress Notes (Addendum)
  Progress Note    03/14/2015 7:33 AM 12 Days Post-Op  Subjective:  Ready to go home  Afebrile HR 60's-70's NSR 120's-150's systolic 95% 3LO2NC  Filed Vitals:   03/14/15 0447 03/14/15 0500  BP: 98/49 157/75  Pulse: 69 67  Temp: 98.1 F (36.7 C)   Resp: 18     Physical Exam: Cardiac:  regular Lungs:  Some rhonchi bilateral bases Incisions:  Healing nicely Extremities:  Easily palpable right DP pulse; mild edema right foot/ankle   CBC    Component Value Date/Time   WBC 17.1* 03/14/2015 0258   RBC 3.47* 03/14/2015 0258   HGB 10.6* 03/14/2015 0258   HCT 32.4* 03/14/2015 0258   PLT 438* 03/14/2015 0258   MCV 93.4 03/14/2015 0258   MCH 30.5 03/14/2015 0258   MCHC 32.7 03/14/2015 0258   RDW 15.3 03/14/2015 0258   LYMPHSABS 2.2 03/14/2015 0258   MONOABS 0.9 03/14/2015 0258   EOSABS 0.2 03/14/2015 0258   BASOSABS 0.0 03/14/2015 0258    BMET    Component Value Date/Time   NA 143 03/14/2015 0258   K 3.3* 03/14/2015 0258   CL 104 03/14/2015 0258   CO2 30 03/14/2015 0258   GLUCOSE 94 03/14/2015 0258   BUN 21* 03/14/2015 0258   CREATININE 0.67 03/14/2015 0258   CALCIUM 8.6* 03/14/2015 0258   GFRNONAA >60 03/14/2015 0258   GFRAA >60 03/14/2015 0258    INR    Component Value Date/Time   INR 1.01 02/27/2015 1128     Intake/Output Summary (Last 24 hours) at 03/14/15 0733 Last data filed at 03/13/15 1700  Gross per 24 hour  Intake    360 ml  Output      0 ml  Net    360 ml     Assessment:  69 y.o. female is s/p:  right femoral to below-knee popliteal bypass with composite vein graft  12 Days Post-Op  Plan: -pt continues to have palpable right DP pulse -she does have a persistent mild leukocytosis-she is not on any ABx-may need abx for possible PNA-yesterday's xray revealed worsening right opacification of right, which was greater than day before.  She is coughing up yellow sputum.  Will d/w Pulmonary - He says to restart Levaquin today (All Abx were  stopped on 11/24) -wean O2 as tolerated-will need to wean before discharge -will defer to cardiology for discharge medications recommendations and follow up -encouraged pt to continue incentive spirometry and increasing ambulation. -DVT prophylaxis:  Eliquis -if off O2 tomorrow-will d/c   Doreatha MassedSamantha Rhyne, PA-C [facility] [phone] 03/14/2015 7:33 AM  Agree with above. Continue Levaquin Likely home tomorrow.  Waverly Ferrarihristopher Connee Ikner, MD, FACS Beeper [phone] Office: [phone]

## 2015-03-14 NOTE — Progress Notes (Signed)
Nutrition Follow-up  DOCUMENTATION CODES:   Not applicable  INTERVENTION:   No nutrition intervention at this time --- family declined  NUTRITION DIAGNOSIS:   Inadequate oral intake now related to poor appetite (dislike of hospital food) as evidenced by meal completion < 50% , ongoing  GOAL:   Patient will meet greater than or equal to 90% of their needs, progressing  MONITOR:   PO intake, Labs, Weight trends, I & O's  ASSESSMENT:   69 yo Female with PMH of HTN, atrial fib, atrial arrhythmia; presented with bilateral lower extremity claudication.  Patient s/p procedures 11/17: RIGHT COMMON FEMORAL ARTERY ENDARTERECTOMY RIGHT COMMON FEMORAL ARTERY TO BELOW-KNEE POPLITEAL ARTERY BYASS  Patient self extubated 11/24.  RD spoke with patient's husband at bedside.  Reports the hospital food is "not edible".  They are bringing in food from outside.  PO intake variable at 10-50% per flowsheet records.  RD offered oral nutrition supplements, however, pt's husband declined on patient's behalf.    Diet Order:  Diet Heart Room service appropriate?: Yes; Fluid consistency:: Thin  Skin:   (Incision on R leg, groin, skin tear R thigh)  Last BM:  11/28  Height:   Ht Readings from Last 1 Encounters:  03/03/15 5\' 5"  (1.651 m)    Weight:   Wt Readings from Last 1 Encounters:  03/12/15 129 lb 6.6 oz (58.7 kg)    Ideal Body Weight:  56.8 kg  BMI:  Body mass index is 21.53 kg/(m^2).  Estimated Nutritional Needs:   Kcal:  1500-1700  Protein:  70-80 gm  Fluid:  1.5-1.7 L  EDUCATION NEEDS:   No education needs identified at this time  Maureen ChattersKatie Axtyn Woehler, RD, LDN Pager #: [phone] After-Hours Pager #: [phone]

## 2015-03-14 NOTE — Progress Notes (Signed)
Physical Therapy Treatment Patient Details Name: Beverly Li MRN: [id number] DOB: 25-Feb-1946 Today's Date: 03/14/2015    History of Present Illness pt is a 69 y/o female with h/o HTN, afib and PVD, admitted with complaints of lifestyle limiting bil LE claudication.  Pt s/p R common femoral art endarerectomy and R fem-pop BPG.    PT Comments    Patient more fatigued today. SpO2 88% on 3L supplemental O2 during ambulation, improved to 91% on 4L. HR 70s. Min assist for balance intermittently. Patient will continue to benefit from skilled physical therapy services to further improve independence with functional mobility. Encouraged and reviewed use of flutter valve and incentive inspirometer.   Follow Up Recommendations  Home health PT     Equipment Recommendations   (TBA)    Recommendations for Other Services       Precautions / Restrictions Precautions Precautions: Fall Restrictions Weight Bearing Restrictions: No    Mobility  Bed Mobility Overal bed mobility: Needs Assistance Bed Mobility: Supine to Sit;Sit to Supine     Supine to sit: HOB elevated;Min guard Sit to supine: HOB elevated;Min guard   General bed mobility comments: Slow to rise, VC for technique. Some difficulty lifting LEs back into bed but completed without physical assist.  Transfers Overall transfer level: Needs assistance Equipment used: Rolling walker (2 wheeled);1 person hand held assist Transfers: Sit to/from Stand Sit to Stand: Min guard;Min assist         General transfer comment: Min assist for support to rise without assistive device. Performed at min guard level with use of RW for stability from bed.  Ambulation/Gait Ambulation/Gait assistance: Min assist Ambulation Distance (Feet): 110 Feet Assistive device: Rolling walker (2 wheeled) Gait Pattern/deviations: Step-through pattern;Decreased stride length;Scissoring;Staggering right;Narrow base of support Gait velocity:  decreased Gait velocity interpretation: Below normal speed for age/gender General Gait Details: Required min assist on a couple of occasions to correct balance. VC for wider base of support and instructions for DME use. No buckling noted. Required several standing rest breaks to complete distance. SpO2 88% on 3L supplemental O2, and 91% on 4L supplemental O2.   Stairs            Wheelchair Mobility    Modified Rankin (Stroke Patients Only)       Balance                                    Cognition Arousal/Alertness: Awake/alert Behavior During Therapy: WFL for tasks assessed/performed Overall Cognitive Status: Within Functional Limits for tasks assessed                      Exercises      General Comments General comments (skin integrity, edema, etc.): SpO2 91-92% on 3L at rest. HR 70s throughout session. 3/4 dyspnea. Instructions for flutter valve use and incentive inspirometer.      Pertinent Vitals/Pain Pain Assessment: No/denies pain Pain Intervention(s): Monitored during session    Home Living                      Prior Function            PT Goals (current goals can now be found in the care plan section) Acute Rehab PT Goals Patient Stated Goal: Home soon PT Goal Formulation: With patient Time For Goal Achievement: 03/25/15 Potential to Achieve Goals: Good Progress towards PT goals: Progressing  toward goals    Frequency  Min 3X/week    PT Plan Current plan remains appropriate    Co-evaluation             End of Session Equipment Utilized During Treatment: Oxygen Activity Tolerance: Patient tolerated treatment well Patient left: with call bell/phone within reach;with family/visitor present;in bed     Time: [id number] PT Time Calculation (min) (ACUTE ONLY): 22 min  Charges:  $Gait Training: 8-22 mins                    G Codes:      Berton MountBarbour, Erian Rosengren S 03/14/2015, 11:11 AM Charlsie MerlesLogan Secor Sayuri Rhames,  PT [phone]

## 2015-03-14 NOTE — Progress Notes (Signed)
UR Completed. Ellory Khurana, RN, BSN.  [phone] 

## 2015-03-14 NOTE — Progress Notes (Signed)
Patient Name: Beverly Li Date of Encounter: 03/14/2015  Principal Problem:   Acute respiratory failure with hypoxia (HCC) Active Problems:   PAD (peripheral artery disease) (HCC)   Encounter for central line placement   Hypertrophic obstructive cardiomyopathy (HCC)   Demand ischemia (HCC)   Hypophosphatemia   Acute on chronic diastolic CHF (congestive heart failure), NYHA class 4 (HCC)   HCAP (healthcare-associated pneumonia)   S/P femoral-popliteal bypass surgery   Hypokalemia   Atrial fibrillation with rapid ventricular response (HCC)   New onset atrial fibrillation [facility])   Essential hypertension   Primary Cardiologist: Dr. Anne Fu  Patient Profile: 69 yo female w/ PMH of hypertrophic cardiomyopathy, HTN, and PAD admitted for right fem-pop bypass on 03/02/2015 who developed hemodynamic instability while in the OR. Echo at that time showed a dynamic outflow gradient. Cardiology consulted for afib. She self extubated on 11/24.   SUBJECTIVE:   Sore throat resolved. Coughing more today. Had hallucination last night, says she thought she was on [employer].   OBJECTIVE Filed Vitals:   03/14/15 0500 03/14/15 0825 03/14/15 0827 03/14/15 0959  BP: 157/75   134/59  Pulse: 67     Temp:      TempSrc:      Resp:      Height:      Weight:      SpO2: 95% 93% 94%     Intake/Output Summary (Last 24 hours) at 03/14/15 1018 Last data filed at 03/14/15 0815  Gross per 24 hour  Intake    480 ml  Output      0 ml  Net    480 ml   Filed Weights   03/10/15 0500 03/11/15 0700 03/12/15 0700  Weight: 140 lb 10.5 oz (63.8 kg) 132 lb 7.9 oz (60.1 kg) 129 lb 6.6 oz (58.7 kg)    PHYSICAL EXAM General: Well developed, well nourished, female. Head: Normocephalic, atraumatic. 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 Lungs: Diminished breath sound in bilateral bases. Noticeable expiratory rhonchi Heart:  RRR, S1, S2, no S3, S4, 2/6 SEM at LLSB; no rub. Abdomen: soft, nondistended, nontender. Extremities: No clubbing, cyanosis. Distal pedal pulses are 2+ bilaterally. 1-2+ pitting edema in RLE Neuro: Follows command, move all extremities.   LABS: CBC:  Recent Labs  03/13/15 0424 03/14/15 0258  WBC 17.8* 17.1*  NEUTROABS 14.2* 13.8*  HGB 11.1* 10.6*  HCT 34.0* 32.4*  MCV 93.2 93.4  PLT 443* 438*   Basic Metabolic Panel:  Recent Labs  40/98/11 0230 03/14/15 0258  NA 143 143  K 3.1* 3.3*  CL 102 104  CO2 33* 30  GLUCOSE 99 94  BUN 23* 21*  CREATININE 0.63 0.67  CALCIUM 8.5* 8.6*  MG 2.2 2.2  PHOS 3.2 3.2   TELE:   NSR with rate in 80's - 90's.      ECHO: 03/04/2015 Study Conclusions - Left ventricle: The cavity size was normal. Wall thickness was increased in a pattern of moderate LVH. Systolic function was vigorous. The estimated ejection fraction was in the range of 65% to 70%. Wall motion was normal; there were no regional wall motion abnormalities. Features are consistent with a pseudonormal left ventricular filling pattern, with concomitant abnormal relaxation and increased filling pressure (grade 2 diastolic dysfunction). - Aortic valve: Mildly to moderately calcified annulus. - Left atrium: The atrium was mildly dilated. - Pericardium, extracardiac: There was a  left pleural effusion.  Radiology/Studies: Dg Chest Port 1 View: 03/06/2015  CLINICAL DATA:  69 year old female status post right-sided fem-pop bypass graft and right femoral endarterectomy. History of claudication. EXAM: PORTABLE CHEST 1 VIEW COMPARISON:  Chest x-ray 03/05/2015. FINDINGS: An endotracheal tube is in place with tip 2.8 cm above the carina. There is a left-sided internal jugular central venous catheter with tip terminating in the distal superior vena cava. A nasogastric tube is seen extending into the stomach, however, the tip of the nasogastric tube extends below the lower  margin of the image. Lung volumes are low. Extensive opacities throughout the mid to lower lungs bilaterally may reflect areas of atelectasis and/or airspace consolidation. Moderate bilateral pleural effusions. Heart size is mildly enlarged. Cephalization of the pulmonary vasculature. The patient is rotated to the right on today's exam, resulting in distortion of the mediastinal contours and reduced diagnostic sensitivity and specificity for mediastinal pathology. Atherosclerosis in the thoracic aorta. IMPRESSION: 1. Support apparatus, as above. 2. The appearance the chest is very similar to the prior examination. Findings may simply reflect congestive heart failure, with extensive bibasilar atelectasis in the setting of moderate bilateral pleural effusions, however, underlying airspace consolidation throughout the mid to lower lungs from infectious etiologies or aspiration is not excluded. 3. Atherosclerosis. Electronically Signed   By: Trudie Reedaniel  Entrikin M.D.   On: 03/06/2015 07:16   Dg Chest Port 1 View: 03/05/2015  CLINICAL DATA:  Respiratory failure. EXAM: PORTABLE CHEST 1 VIEW COMPARISON:  03/04/2015 FINDINGS: Endotracheal tube, enteric catheter, left internal jugular approach central venous catheter stable. Cardiomediastinal silhouette is enlarged. Mediastinal contours appear intact. There is no evidence of pneumothorax. Bilateral lower lobe predominance airspace opacities are not significantly changed. Osseous structures are without acute abnormality. Soft tissues are grossly normal. IMPRESSION: No significant change in the aeration of the lungs with bilateral lower lobe airspace opacities, which may represent bilateral pleural effusions with atelectasis or airspace consolidation. Stable supporting lines and tubes. Electronically Signed   By: Ted Mcalpineobrinka  Dimitrova M.D.   On: 03/05/2015 09:05     Current Medications:  . amiodarone  400 mg Oral BID  . amLODipine  5 mg Oral Daily  . antiseptic oral rinse   7 mL Mouth Rinse BID  . apixaban  5 mg Oral BID  . aspirin EC  325 mg Oral Daily  . B-complex with vitamin C  1 tablet Oral Daily  . budesonide (PULMICORT) nebulizer solution  0.5 mg Nebulization BID  . buPROPion  150 mg Oral BID  . furosemide  40 mg Intravenous Once  . [START ON 03/15/2015] furosemide  20 mg Oral Daily  . guaiFENesin  1,200 mg Oral BID  . ipratropium-albuterol  3 mL Nebulization Q6H  . levofloxacin (LEVAQUIN) IV  500 mg Intravenous Q24H  . multivitamin with minerals  1 tablet Oral QHS  . nebivolol  10 mg Oral Daily  . pantoprazole  40 mg Oral Daily  . rosuvastatin  5 mg Oral QHS  . senna-docusate  1 tablet Oral BID  . sodium chloride  10-40 mL Intracatheter Q12H   . sodium chloride 10 mL/hr at 03/02/15 1427  . lactated ringers 10 mL/hr at 03/12/15 1100    ASSESSMENT AND PLAN:  1. Hypertrophic obstructive cardiomyopathy - known history of hypertrophic cardiomyopathy. On 03/02/2015 she developed hemodynamic instability while in the OR. Echo at that time showed a dynamic outflow gradient. - Continue to avoid beta agonist such as epinephrine, norepinephrine, dobutamine, dopamine - Avoid diuresis if possible as  this would exacerbate HOCM physiology by decreasing preload.  - Trying to avoid excessive PEEP which would increase intrathoracic pressure which would then decrease preload and potentially worsen obstructive physiology. - diminished breath sound in bilateral bases, more LE edema on R side today, has expiratory rhonchi, started on levoquin. Continue Albuterol nebulizer. Will give 1 dose  IV lasix, add  PO lasix daily starting tomorrow.   2. PAF - Developed atrial fibrillation on 03/04/2015. Converted to NSR on 03/05/2015 on amiodarone. - IV amiodarone transitioned to PO amio on 03/09/2015, plan for 7 days of PO amio, transition to  BID PO amio after 7 days starting 03/16/2015.  - started on Eliquis. Will defer to vascular surgery regarding the  need for high dose ASA on top of eliquis.   3. Elevated troponin - cyclic troponin values were 6.96, 0.04, and 0.06.  - Minimally elevated, flat, demand ischemia in the setting of hypertrophic obstructive cardiomyopathy and transient hypotension  4. Peripheral vascular disease - Status post femoropopliteal bypass - per Vascular Surgery  5. Hypomagnesemia - Resolved  6. HCAP - on Vancomycin and Levaquin - per Critical Care  7. VDRF - CCM managing vent.  8. Hallucination per pt: not sure cause, on wellbutrin Ramond Dial PA Pager: [phone]   I have seen, examined and evaluated the patient this AM along with Mr. Lisabeth Devoid, [place].  After reviewing all the available data and chart,  I agree with his findings, examination as well as impression recommendations.  Clinically - from a CV standpoint, stable.  No further Afib.  Agree with 1 dose of lasix due to breathing difficulty &Mr. Meng's exam (no rales on my exam - after cough).  She may benefit from low dose standing prn PO Lasix.  On PO amiodarone for Afib suppression along with Eliquis. -- defer to OP Cardiologist to determine duration of Rx for both.  Looks like maybe 1 more day before d/c.   Marykay Lex, M.D., M.S. Interventional Cardiologist   Pager # [phone]

## 2015-03-14 NOTE — Care Management Note (Addendum)
Case Management Note  Patient Details  Name: Beverly Li MRN: [id number] Date of Birth: 06/14/1945  Subjective/Objective:       Pt admitted with PAD             Action/Plan:  Pt is independent from home with husband.  Current Plan is to wean pt down from supplemental O2.     Expected Discharge Date:  03/10/15               Expected Discharge Plan:  Home/Self Care  In-House Referral:     Discharge planning Services  CM Consult  Post Acute Care Choice:    Choice offered to:     DME Arranged:    DME Agency:     HH Arranged:   PT HH Agency:   [facility]  Status of Service:  In process, will continue to follow  Medicare Important Message Given:  Yes Date Medicare IM Given:    Medicare IM give by:    Date Additional Medicare IM Given:    Additional Medicare Important Message give by:     If discussed at Long Length of Stay Meetings, dates discussed:    Additional Comments: Provided 30 day Eliquis card to pt and husband.  [facility] and husband; [facility] and [facility] was accepted.  [facility] per pt request to go by room and provide information regarding purchasing preference DME.  CM assessed pt, husband at bedside.  [facility].  Pt and husband also requested DME and Private Duty Care list from CM to research possible DME request made by pt (3:1, PRN oxygen and rolling walker), DME is not currently recommended by in house therapy nor MD.   [facility] stated they want "better" and want to look at options at brick and mortar store.  CM contacted [facility] in [place] (pharamacy of choice)  CM informed pharmacy can fill prescription for Eliquis.  CM informed pt and husband that she was unable to determine how much monthly copay will be for Eliqius; insurance was unwilling to provide information to CMA and [facility] was unable to determine copay without actual script, pt and husband informed  CM that "money is not a problem, we can pay whatever it is).  CM will follow up with pt regarding HH ageny of choice and continue to monitor for disposition needs. Beverly Li, Beverly Dolinar S, RN 03/14/2015, 11:47 AM

## 2015-03-15 ENCOUNTER — Telehealth: Payer: Self-pay

## 2015-03-15 ENCOUNTER — Other Ambulatory Visit: Payer: Self-pay | Admitting: Physician Assistant

## 2015-03-15 MED ORDER — APIXABAN 5 MG PO TABS: 5.0000 mg | ORAL_TABLET | Freq: Two times a day (BID) | ORAL | Status: DC

## 2015-03-15 MED ORDER — AMIODARONE HCL 200 MG PO TABS: 200.0000 mg | ORAL_TABLET | Freq: Every day | ORAL | Status: DC

## 2015-03-15 MED ORDER — LEVOFLOXACIN 500 MG PO TABS: 500.0000 mg | ORAL_TABLET | Freq: Every day | ORAL | Status: DC

## 2015-03-15 MED ORDER — AMIODARONE HCL 200 MG PO TABS: 200.0000 mg | ORAL_TABLET | Freq: Two times a day (BID) | ORAL | Status: DC

## 2015-03-15 MED ORDER — TRAMADOL HCL 50 MG PO TABS: 50.0000 mg | ORAL_TABLET | Freq: Four times a day (QID) | ORAL | Status: DC | PRN

## 2015-03-15 MED ORDER — AMIODARONE HCL 400 MG PO TABS: 400.0000 mg | ORAL_TABLET | Freq: Two times a day (BID) | ORAL | Status: DC

## 2015-03-15 MED ORDER — FUROSEMIDE 20 MG PO TABS: 20.0000 mg | ORAL_TABLET | Freq: Every day | ORAL | Status: DC | PRN

## 2015-03-15 MED ORDER — LEVALBUTEROL HCL 0.63 MG/3ML IN NEBU: 0.6300 mg | INHALATION_SOLUTION | Freq: Two times a day (BID) | RESPIRATORY_TRACT | Status: DC

## 2015-03-15 MED ORDER — POTASSIUM CHLORIDE CRYS ER 20 MEQ PO TBCR
40.0000 meq | EXTENDED_RELEASE_TABLET | Freq: Once | ORAL | Status: AC
  Administered 2015-03-15: 40 meq via ORAL
  Filled 2015-03-15: qty 2

## 2015-03-15 MED ORDER — LEVALBUTEROL HCL 0.63 MG/3ML IN NEBU: 0.6300 mg | INHALATION_SOLUTION | Freq: Three times a day (TID) | RESPIRATORY_TRACT | Status: DC

## 2015-03-15 MED ORDER — ALBUTEROL SULFATE (2.5 MG/3ML) 0.083% IN NEBU: 2.5000 mg | INHALATION_SOLUTION | RESPIRATORY_TRACT | Status: DC | PRN

## 2015-03-15 NOTE — Discharge Summary (Signed)
Discharge Summary     Beverly Li 04/04/1946 69 y.o. female  [id number]  Admission Date: 03/02/2015  Discharge Date: 03/15/15  Physician: Chuck Hint, MD  Admission Diagnosis: Peripheral vascular disease with bilateral lower extremity claudication I70.213   HPI:   This is a 70 y.o. female 71 following with bilateral lower extremity claudication. She felt that her symptoms were interfering with her lifestyle and wish to consider revascularization. For this reason she was set up for an arteriogram.  Her arteriogram on 01/16/2015 shows that on the right side there is plaque in the common femoral artery. There is moderate disease of the superficial femoral artery which is occluded at the adductor canal with reconstitution at the popliteal artery at the level of the knee. There is three-vessel runoff on the right. On the left side there is eccentric calcific plaque in the left common femoral artery. The left superficial femoral artery is occluded at its origin with reconstitution of the above-knee popliteal artery. There is three-vessel runoff on the left.  She has bilateral calf claudication which also extends to her thighs at times. Her symptoms are equal on both sides. Her symptoms are brought on by ambulation and relieved with rest. She denies any history of rest pain or history of nonhealing ulcers. She feels however that her symptoms are significantly disabling in that she is not able to travel origin joining her normal routine.  Hospital Course:  The patient was admitted to the hospital and taken to the operating room on 03/02/2015 and underwent: 1. Right common femoral artery endarterectomy 2. Right common femoral artery to below-knee popliteal artery bypass with composite vein graft (2 pieces right great saphenous vein spliced together)    Intraoperative findings as follows: The patient had a bifid saphenous system on the left and I harvested both segments and  took the 2 largest segments and spliced them together for a graft to the below-knee popliteal artery. There was significant plaque within the common femoral artery and some old thrombus which was endarterectomized. During the procedure the patient had multiple episodes where her blood pressure would drop and she would have ST changes on her EKG. Intraoperative transesophageal echo was performed which showed significant hypertrophic outflow obstruction of the left ventricle and cardiology was counseled that intraoperatively.  Cardiology consult 03/02/15: She has a known history of hypertrophic cardiomyopathy and prior echocardiogram in 2012 showed no significant outflow tract gradient at rest. She has not had any symptoms clinically related to her hypertrophic cardiomyopathy in the outpatient setting.  She did have an emergency room visit on 09/19/12 approximate 2 years ago when she had chest discomfort. ST segment changes were noted on the EKG especially lateral leads however this was not changed from her prior original EKG. Troponin was normal. PPI helped out significantly.  In the outpatient setting she has been taking Bystolic 10 mg once a day. She has been on amlodipine as well as losartan for quite some time without any hemodynamic difficulties.  Claudication has been significant and prompted further evaluation by Dr. Edilia Bo.  Today in the operating room, she began to develop hemodynamic instability and a transesophageal echocardiogram was performed which showed fairly significant mitral regurgitation, obliteration of her ventricular cavity, significant aortic insufficiency as well as tricuspid valve regurgitation and pulmonic insufficiency which improved after aggressive fluid resuscitation and phenylephrine was administered. I personally entered the operating room during the end of the case and corresponded with anesthesiology staff.  Previous echo did not demonstrate a  singificant outflow tract  gradient.  Intraop TEE definitely demonstrates a dynamic outflow tract gradient and during period of increased hyperdynamic state, stress response, her gradient was accentuated and resulted in hemodynamic instability.  Prompt fluid resuscitation was administered as well as phenylephrine for alpha agonist.  Avoidance of beta agonist such as dopamine, epinephrine, and norepinephrine was initiated.  Her beta blocker was restarted when tolerated, which will help minimize the outflow tract gradient.  T wave changes/ST segment depressions were accentuated per report during the case, which certainly could've occurred during periods of either significant hypotension or hypertension given teh thickness of her septum/coronary gradient.  She does have significant lateral ST segment depressions at baseline.  Her recent nuclear stress test was reassuring.  The pt was transported to the ICU intubated and CCM was consulted.  Pulmonary Consult 03/02/15: 75 yearo old female with baseline claudication but no dyspnea but has some HOCM (followed by Dr Anne Fu) but at baseline and has had a normal cardiac stress test and is able to climb stairs without dyspnea. He underwent on 03/02/2015 right below knee popliteal artery femoropopliteal bypass and femoral artery endarterectomy on the right. Overall operative time was approximately 4 hours according to anesthesia. First 2 hours no problem but subsequently started having severe evidence of hypertrophic obstructive cardiomyopathy as evidenced by intraoperative echo and periods of hypotension. Status post 4-5 liters of fluid and for albumin infusions. Brought into the intensive care unit postoperatively and was spontaneously weaned off Neo-Synephrine but was found to be in respiratory acidosis with severe hypoxemia and chest x-ray with acute pulmonary edema bilaterally. Pulmonary critical care called for consultation.  She was kept on full vent support with high PEEP and minute  ventilation.   By POD 1, she remained intubated.  She did have a drain that was removed due to decreased output.  She had been started on low dose heparin post operatively, which was d/c'd on POD 1 and prophylactic Lovenox was started.  She did have a fever of 101.9 with pulmonary congestion and perihilar infiltrates possible pulmonary edema.  Trying to avoid excessive PEEP which would increase intrathoracic pressure which would then decrease preload and potentially worsen obstructive physiology.  Per cardiology, pulmonary edema seemed to be improving with decreased O2 needs and auto diuresis.   The remainder of the hospital course consisted of increasing mobilization and increasing intake of solids without difficulty.  Since she seems to be moving in the right direction, I do not think at this point she requires temporary pacing which would then cause asynchronous contraction of the septum, and hopeful continued improvement of obstructive physiology.  Her case was discussed among multiple cardiologists.  She did have an elevated troponin, with minimally flat, demand ischemia in the setting of HOCM and transient hypotension.    She was started on TF's for nutrition.  By the night of POD 1, she had a Tm of 103 and she was started on Vanc and Levaquin, BC, UA, and sputum cx obtained.   Her blood cx did not have any growth after 5 days.  Her u/a was negative.  Her sputum cx revealed Non-Pathogenic Oropharyngeal-type Flora Isolated.  On POD 2, she did undergo a TTE.  preliminary look yesterday showed normal LV function with severe SAM and LVOT gradient of 6 m/s (final results pending). Pulmonary edema likely related to obstructive physiology.   She also went into rapid Afib and HR was sustained > 125.  She became more hypotensive and required an increase  in her neo gtt.  She was bolused and started on amiodarone.  She was started on heparin also.    On POD 3, she did not wean well with the vent.  She  becomes anxious, which exacerbates her cardiac issues.  She was tolerating her TF's.  She did convert back to NSR.  Her amiodarone was discontinued due to having a junctional rhythm and was subsequently restarted later.  By POD 4, her neo was off and her pressures were becoming more stable.    By POD 5, drastic drop in BP this morning from hypertensive to hypotensive after switching to Precedex. SBP now 70s, neosynephrine restarted. May need to restart levophed as well. Extremities warm on exam. Per nurse, she is making at least 60ml of urine per hour. Need to bring her MAP to at least . - Li continue to sound very junky, would continue abx (levoquin and vanc) to cover aspiration PNA. Received  IV lasix yesterday, would hold off on further diuretic until BP improve.   On POD 6, she went back into Afib and her amiodarone was restarted.  Continue IV amiodarone. Previously developed junctional brady after converting to sinus. After she converts, would change to oral amiodarone if she can tolerate in order to reduce risk of recurrent AF. Start IV heparin for thromboembolism prophylaxis in setting of AF. Begin IV lasix 20 mg daily in setting of volume overload - dry weight 128# - weight up 12 # from baseline.  On POD 6, she converted back to NSR and was changed to po amiodarone.  She was also started on IV lasix to maintain even fluid balance.  She was also restarted on her beta blocker.   Later that evening, the pt did self extubate and did remain extubated throughout the rest of her hospitalization.  On 03/10/15, Atrial fibrillation with RVR: Patient is maintaining sinus rhythm. Will continue amiodarone 400 mg twice daily. Will increase nebivolol back to her baseline dose. The patient has had atrial fibrillation in the setting of critical illness. She has had 2 recurrences now. Considering her high CHADS-Vasc score, I would be in favor of continuing heparin another 24 hours, then transitioning  to a NOAC drug tomorrow with plans to continue at least for one month.  On POD 9, her lungs were clear 2 days post extubation.   She was maintaining NSR.    On POD 10, she was transferred out of ICU.  On POD 11, Cardiology:   spent a long time talking with this very pleasant woman and her husband. She has a history of hypertrophic cardiomyopathy and was admitted for femoropopliteal bypass surgery. Unfortunately her operation was cut short so that she was only having successful bypass on 1 leg as post bilateral this was secondary to A. fib RVR with hypotension and presumed sepsis.  She now appears to be relatively stable. No longer in A. fib with telemetry showing sinus rhythm. Currently on amiodarone plus beta blocker -- I would continue the taper oral dosing as recommended (completing 1 week of 400 mg twice a day followed by 1 week of 200 mL twice a day and then 200 mg daily until seen by her primary cardiologist)  She is currently on ELIQUIS for stroke prevention. It does seem like her event was perioperative A. fib, however with cardiomyopathy she is at increased risk of defibrillation, therefore agree with regulation on ELIQUIS.  Minor troponin elevations in the setting of A. fib RVR and hypotension with hypertrophic artery  myopathy patient. This is simply demand ischemia.  No active heart failure symptoms. Would be very careful to avoid overdiuresis. I think she does not require any additional diuresis at this point.  She actually appears relatively stable from a cardiac standpoint and would be okay to be discharged from a cardiac standpoint unless something changes.  On POD 12, the pt continued to have a persistent mild leukocytosis and was coughing up yellow sputum.  It was discussed with Pulmonary and advised to restart her Levaquin.    By POD 13, she continued to be in NSR and was on amiodarone and Eliquis.  She was having some breathing difficulty and received a dose of lasix, which  did benefit pt.  She subsequently was discharged on home O2.    Discharge meds discussed with Dr. Herbie Baltimore and f/u appts were made.  She was discharged home on POD 13.   ABI's on 03/12/15:  RIGHT    LEFT    PRESSURE WAVEFORM  PRESSURE WAVEFORM  BRACHIAL 145 Triphasic BRACHIAL 142 Triphasic  DP 146 Triphasic DP 79 Monophasic  PT 134 Triphasic PT 89 Monophasic    RIGHT LEFT  ABI 1.01 0.61         CBC    Component Value Date/Time   WBC 17.1* 03/14/2015 0258   RBC 3.47* 03/14/2015 0258   HGB 10.6* 03/14/2015 0258   HCT 32.4* 03/14/2015 0258   PLT 438* 03/14/2015 0258   MCV 93.4 03/14/2015 0258   MCH 30.5 03/14/2015 0258   MCHC 32.7 03/14/2015 0258   RDW 15.3 03/14/2015 0258   LYMPHSABS 2.2 03/14/2015 0258   MONOABS 0.9 03/14/2015 0258   EOSABS 0.2 03/14/2015 0258   BASOSABS 0.0 03/14/2015 0258    BMET    Component Value Date/Time   NA 143 03/14/2015 0258   K 3.3* 03/14/2015 0258   CL 104 03/14/2015 0258   CO2 30 03/14/2015 0258   GLUCOSE 94 03/14/2015 0258   BUN 21* 03/14/2015 0258   CREATININE 0.67 03/14/2015 0258   CALCIUM 8.6* 03/14/2015 0258   GFRNONAA >60 03/14/2015 0258   GFRAA >60 03/14/2015 0258     Discharge Instructions    Call MD for:  redness, tenderness, or signs of infection (pain, swelling, bleeding, redness, odor or green/yellow discharge around incision site)    Complete by:  As directed      Call MD for:  severe or increased pain, loss or decreased feeling  in affected limb(s)    Complete by:  As directed      Call MD for:  temperature >100.5    Complete by:  As directed      Discharge wound care:    Complete by:  As directed   Wash the groin wound with soap and water daily and pat dry. (No tub bath-only shower)  Then put a dry gauze or washcloth there to keep this area dry daily and as needed.  Do not use Vaseline or neosporin on your incisions.  Only use soap and water on your incisions and then protect  and keep dry.     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 Atrial fibrillation with rapid ventricular response (HCC)   . New onset atrial fibrillation (HCC)   . Essential hypertension   . HCAP (healthcare-associated pneumonia)   . S/P femoral-popliteal bypass surgery   . Hypokalemia   . Hypophosphatemia   . Acute on chronic diastolic CHF (congestive heart failure), NYHA class 4 (HCC)   . Hypertrophic obstructive cardiomyopathy (HCC) 03/03/2015  . Demand ischemia (HCC) 03/03/2015  . PAD (peripheral artery disease) (HCC) 03/02/2015  . Acute respiratory failure with hypoxia (HCC) 03/02/2015  . Encounter for central line placement   . Preoperative cardiovascular examination 01/31/2015  . Claudication (HCC) 01/31/2015  . Peripheral vascular disease (HCC) 01/31/2015  . Smoker 01/31/2015  . Hyperlipidemia 01/31/2015  . Atherosclerosis of native arteries of extremity with intermittent claudication (HCC) 02/10/2014  . 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 Systolic murmur   . Hypertension   . Atrial arrhythmia   . Mild aortic sclerosis (HCC)   . Atrial septal hypertrophy   . Syncope    Past Medical History  Diagnosis Date  . Atrial arrhythmia   . Hypertension   . Insomnia   . Systolic murmur   . Atrial septal hypertrophy     , proximal, without significant gradient  . Mild aortic sclerosis (HCC)   . Osteopenia   . Syncope     , Vagal  . Dysrhythmia     afib  . GERD (gastroesophageal reflux disease)   . History of kidney stones   . Hypertrophic cardiomyopathy (HCC)     severe LV basilar  hypertrophy witn no evidence of significant outflow tract obstruction, EF 65-70%, mild LAE, mild TR, grade 1a diastolic dysfunction 05/15/10 (Dr. Donato Schultz)  . Atrial fibrillation (HCC)   . Peripheral vascular disease (HCC)   . Anxiety   . Arthritis   . Bell's palsy     when pt. was 69 yrs old, when under stress the left side of face will droop.       Medication List    STOP taking these medications        losartan 100 MG tablet  Commonly known as:  COZAAR      TAKE these medications        acetaminophen 500 MG tablet  Commonly known as:  TYLENOL  Take 1,000 mg by mouth every 8 (eight) hours as needed for mild pain or moderate pain.     albuterol (2.5 MG/3ML) 0.083% nebulizer solution  Commonly known as:  PROVENTIL  Take 3 mLs (2.5 mg total) by nebulization every 2 (two) hours as needed for wheezing or shortness of breath.     amiodarone 400 MG tablet  Commonly known as:  PACERONE  Take 1 tablet (400 mg total) by mouth 2 (two) times daily.     amiodarone 200 MG tablet  Commonly known as:  PACERONE  Take 1 tablet (200 mg total) by mouth 2 (two) times daily.  Start taking on:  03/17/2015     amiodarone 200 MG tablet  Commonly known as:  PACERONE  Take 1 tablet (200 mg total) by mouth daily.  Start taking on:  03/24/2015     amLODipine 10 MG tablet  Commonly known as:  NORVASC  Take 5 mg by mouth daily.     apixaban 5 MG Tabs tablet  Commonly known as:  ELIQUIS  Take 1  tablet (5 mg total) by mouth 2 (two) times daily.     aspirin 325 MG tablet  Take 325 mg by mouth 2 (two) times daily.     buPROPion 150 MG 24 hr tablet  Commonly known as:  WELLBUTRIN XL  TK 1 T PO IN THE MORNING ONCE A DAY     DEXILANT 30 MG capsule  Generic drug:  Dexlansoprazole  TAKE ONE CAPSULE BY MOUTH  EVERYDAY     furosemide 20 MG tablet  Commonly known as:  LASIX  Take 1 tablet (20 mg total) by mouth daily as needed.     levalbuterol 0.63 MG/3ML nebulizer solution  Commonly known  as:  XOPENEX  Take 3 mLs (0.63 mg total) by nebulization 3 (three) times daily.     levofloxacin 500 MG tablet  Commonly known as:  LEVAQUIN  Take 1 tablet (500 mg total) by mouth daily.     multivitamin with minerals Tabs tablet  Take 1 tablet by mouth at bedtime.     nebivolol 10 MG tablet  Commonly known as:  BYSTOLIC  Take 10 mg by mouth at bedtime.     rosuvastatin 5 MG tablet  Commonly known as:  CRESTOR  Take 5 mg by mouth daily.     STRESS B COMPLEX/IRON Tabs  Take 1 tablet by mouth daily.     traMADol 50 MG tablet  Commonly known as:  ULTRAM  Take 1 tablet (50 mg total) by mouth every 6 (six) hours as needed for moderate pain.     zolpidem 10 MG tablet  Commonly known as:  AMBIEN  Take 10 mg by mouth at bedtime as needed for sleep.        Prescriptions given: 1.  Tramadol #30 No Refill  Instructions: 1.  Wash the groin wound with soap and water daily and pat dry. (No tub bath-only shower)  Then put a dry gauze or washcloth there to keep this area dry daily and as needed.  Do not use Vaseline or neosporin on your incisions.  Only use soap and water on your incisions and then protect and keep dry.  Disposition: home  Patient's condition: is Good  Follow up: 1. Dr. Edilia Boickson in 2 weeks 2. Dr. Anne FuSkains 03/22/15   Doreatha MassedSamantha Rhyne, PA-C [facility] [phone] 03/15/2015  10:44 AM  - For VQI Registry use --- Instructions: Press F2 to tab through selections.  Delete question if not applicable.   Post-op:  Wound infection: No  Graft infection: No  Transfusion: No  If yes, n/a units given New Arrhythmia: Yes Ipsilateral amputation: No, [ ]  Minor, [ ]  BKA, [ ]  AKA Discharge patency: [x ] Primary, [ ]  Primary assisted, [ ]  Secondary, [ ]  Occluded Patency judged by: [ ]  Dopper only, [ ]  Palpable graft pulse, []  Palpable distal pulse, [x ] ABI inc. > 0.15, [ ]  Duplex Discharge ABI: R 1.01, L 0.61 D/C Ambulatory Status:  Ambulatory  Complications: MI: No, [ ]  Troponin only, [ ]  EKG or Clinical CHF: No Resp failure:Yes, [x ] Pneumonia, [ x] Ventilator Chg in renal function: No, [ ]  Inc. Cr > 0.5, [ ]  Temp. Dialysis, [ ]  Permanent dialysis Stroke: No, [ ]  Minor, [ ]  Major Return to OR: No  Reason for return to OR: [ ]  Bleeding, [ ]  Infection, [ ]  Thrombosis, [ ]  Revision  Discharge medications: Statin use:  yes ASA use:  yes Plavix use:  No Eliquis:  Yes Beta blocker  use: yes ACEI use:   no ARB use:  no Coumadin use: no  Of Note:  After pt was discharged on 03/15/15, I received a call from the pt's outpatient pharmacist Clifton Custard) who was concerned about the interaction b/w amiodarone and Levaquin.  After discussion with Dr. Edilia Bo, the Levaquin was discontinued and she was started on a ten day course of clindamycin.   He also stated that insurance was not going to cover the Eliquis.  Our office contacted Dr. Elissa Hefty nurse and they were going to address this issue.  Doreatha Massed 03/16/2015 12:06 PM

## 2015-03-15 NOTE — Progress Notes (Signed)
   VASCULAR SURGERY ASSESSMENT & PLAN:  * 13 Days Post-Op s/p: Right femoral below knee popliteal artery bypass with composite vein graft  *  Continues to make steady progress.  * Discontinue her remaining staples.  * Discharge today on home oxygen. In addition she will continue her Levaquin.  * follow up in 2 weeks.  SUBJECTIVE: Once to go home.  PHYSICAL EXAM: Filed Vitals:   03/14/15 1952 03/14/15 2044 03/14/15 2244 03/15/15 0345  BP: 135/63   120/68  Pulse: 58   58  Temp: 98.1 F (36.7 C)   97.7 F (36.5 C)  TempSrc: Oral   Oral  Resp: 18   20  Height:      Weight:      SpO2: 99% 94% 100% 88%   Palpable right dorsalis pedis pulse. Incisions look fine. Lungs: Rhonchi bilaterally.  LABS: Lab Results  Component Value Date   WBC 17.1* 03/14/2015   HGB 10.6* 03/14/2015   HCT 32.4* 03/14/2015   MCV 93.4 03/14/2015   PLT 438* 03/14/2015   Lab Results  Component Value Date   CREATININE 0.67 03/14/2015   Lab Results  Component Value Date   INR 1.01 02/27/2015   Principal Problem:   Acute respiratory failure with hypoxia (HCC) Active Problems:   PAD (peripheral artery disease) (HCC)   Encounter for central line placement   Hypertrophic obstructive cardiomyopathy (HCC)   Demand ischemia (HCC)   Hypophosphatemia   Acute on chronic diastolic CHF (congestive heart failure), NYHA class 4 (HCC)   HCAP (healthcare-associated pneumonia)   S/P femoral-popliteal bypass surgery   Hypokalemia   Atrial fibrillation with rapid ventricular response (HCC)   New onset atrial fibrillation [facility](HCC)   Essential hypertension    Cari CarawayChris Shaheer Bonfield Beeper: [phone]: [phone] 03/15/2015

## 2015-03-15 NOTE — Telephone Encounter (Signed)
Received call from Doreatha MassedSamantha Rhyne, [place] regarding patient's prescription for Eliquis. Per Lelon MastSamantha, patient's pharmacy states Eliquis is not covered under patient's insurance. Doreatha MassedSamantha Rhyne, PA requests this nurse to contact Dr. Elissa HeftyHarding's office to then contact patient's pharmacy regarding issue, possibility for alternative medication. This nurse notified Burt KnackSharon Martin, RN regarding previously stated issue with Eliquis, providing pharmacist name and contact number. Jasmine DecemberSharon verbalized understanding and agreed to contact pharmacy.

## 2015-03-15 NOTE — Progress Notes (Addendum)
Planning on sending pt home today on home O2.   Spoke to Dr. Herbie BaltimoreHarding for discharge medications:   New Medications: 1.  Amiodarone 400mg  bid x 2 more days (including today)      Then 200mg  bid x 7 days     Then 200mg  daily (duration to be determined by cards) 2.  Lasix 20mg  daily prn leg swelling 3.  Eliquis 5mg  bid (duration to be determined by cards) 4.  Levaquin 500mg  daily x 14 days (HCAP) 5.  ASA 325mg  daily 6.  Albuterol neb 2.5mg  q2h prn wheezing/SOB 7.  Xopenex meb 0.63mg  tid Please advise any changes when you see the pt.  He stated PA will set up her f/u appt in the next couple of weeks.    Beverly Li 03/15/2015. 8:42 AM

## 2015-03-15 NOTE — Progress Notes (Signed)
Subjective: She believes she had an anxiety attach last night.     Objective: Vital signs in last 24 hours: Temp:  [97.7 F (36.5 C)-98.1 F (36.7 C)] 97.7 F (36.5 C) (11/30 0345) Pulse Rate:  [58-68] 58 (11/30 0345) Resp:  [16-20] 20 (11/30 0345) BP: (112-135)/(53-68) 120/68 mmHg (11/30 0345) SpO2:  [88 %-100 %] 88 % (11/30 0345) Last BM Date: 03/13/15  Intake/Output from previous day: 11/29 0701 - 11/30 0700 In: 480 [P.O.:480] Out: -  Intake/Output this shift:    Medications Scheduled Meds: . amiodarone  400 mg Oral BID  . amLODipine  5 mg Oral Daily  . antiseptic oral rinse  7 mL Mouth Rinse BID  . apixaban  5 mg Oral BID  . aspirin EC  325 mg Oral Daily  . B-complex with vitamin C  1 tablet Oral Daily  . budesonide (PULMICORT) nebulizer solution  0.5 mg Nebulization BID  . buPROPion  150 mg Oral BID  . furosemide  20 mg Oral Daily  . guaiFENesin  1,200 mg Oral BID  . levalbuterol  0.63 mg Nebulization TID  . levofloxacin (LEVAQUIN) IV  750 mg Intravenous Q24H  . multivitamin with minerals  1 tablet Oral QHS  . nebivolol  10 mg Oral Daily  . pantoprazole  40 mg Oral Daily  . rosuvastatin  5 mg Oral QHS  . senna-docusate  1 tablet Oral BID  . sodium chloride  10-40 mL Intracatheter Q12H   Continuous Infusions: . sodium chloride 10 mL/hr at 03/02/15 1427  . lactated ringers 10 mL/hr at 03/12/15 1100   PRN Meds:.acetaminophen, albuterol, bisacodyl, hydrALAZINE, labetalol, ondansetron, sodium chloride, sodium chloride, traMADol  PE: General appearance: alert, cooperative, no distress and a Lungs: Decerased BS throughout.  Course crackle on the left.  No Wheezing Heart: regular rate and rhythm and 2/6 Sys MM Extremities: No left LEE.  1+ on the right.  Pulses: 2+ radials.  1+ DPs Skin: Warm and dry Neurologic: Grossly normal  Lab Results:   Recent Labs  03/13/15 0230 03/13/15 0424 03/14/15 0258  WBC 18.5* 17.8* 17.1*  HGB 10.6* 11.1* 10.6*  HCT  32.5* 34.0* 32.4*  PLT 455* 443* 438*   BMET  Recent Labs  03/13/15 0230 03/14/15 0258  NA 143 143  K 3.1* 3.3*  CL 102 104  CO2 33* 30  GLUCOSE 99 94  BUN 23* 21*  CREATININE 0.63 0.67  CALCIUM 8.5* 8.6*    Assessment/Plan  Principal Problem:   Acute respiratory failure with hypoxia (HCC) Active Problems:   PAD (peripheral artery disease) (HCC)   Encounter for central line placement   Hypertrophic obstructive cardiomyopathy (HCC)   Demand ischemia (HCC)   Hypophosphatemia   Acute on chronic diastolic CHF (congestive heart failure), NYHA class 4 (HCC)   HCAP (healthcare-associated pneumonia)   S/P femoral-popliteal bypass surgery   Hypokalemia   Atrial fibrillation with rapid ventricular response (HCC)   New onset atrial fibrillation (HCC)   Essential hypertension  1. Hypertrophic obstructive cardiomyopathy - known history of hypertrophic cardiomyopathy. On 03/02/2015 she developed hemodynamic instability while in the OR. Echo at that time showed a dynamic outflow gradient. -Given 1 dose  IV lasix yesterday and started on  PO lasix daily thereafter.  The patient reported significant UOP.  Continue current dose.   We discussed daily weight monitoring and when to call the office.   2. PAF - Developed atrial fibrillation on 03/04/2015. Converted to NSR on 03/05/2015 on amiodarone, which  she is maintaining. - IV amiodarone transitioned to PO amio on 03/09/2015, plan for 7 days of PO amio, transition to 200mg  BID PO amio after 7 days starting 03/16/2015.  - started on Eliquis.  Also on ASA    3. Elevated troponin Cyclic troponin values were 4.090.03, 0.04, and 0.06.  Minimally elevated, flat, demand ischemia in the setting of hypertrophic obstructive cardiomyopathy and transient hypotension  4. Peripheral vascular disease - Status post femoropopliteal bypass - per Vascular Surgery  5. Hypomagnesemia - Resolved  6. HCAP - on Vancomycin and Levaquin - per  Critical Care WBCs decreasing  7. VDRF  8. Hallucination per pt: not sure cause, on wellbutrin  9.  Hypokalemia:  Supplement today    TCM follow up in our office was arranged with Dr. Anne FuSkains.  12/7, 8:15 AM    LOS: 13 days    HAGER, BRYAN PA-C 03/15/2015 8:43 AM   I have seen, examined and evaluated the patient this AM along with Mr. Leron CroakHager, [place]-C.  After reviewing all the available data and chart,  I agree with his findings, examination as well as impression recommendations.  Overall feeling better today. Still little bit dyspneic. Apparently the plan is for her to go home on oxygen. I do agree that it may be reasonable for her to be on low-dose Lasix, however this can probably be less of a standing dose and then an as needed dose for weight gain. We talked about a sliding scale weight-based treatment plan.  The object is to avoid dehydration. Otherwise she'll stable regimen for her atrial fibrillation. I have discussed the recommendations as far as amiodarone load follow-up. -- Complete 7 days of 400 mg twice a day amiodarone followed by 7 days of 200 mg twice a day and then continue at 200 mg daily after that.  Continue ELIQUIS. No bleeding issues.   She has outpatient follow-up already scheduled with Dr. Donato SchultzMark Skains. Plan is for discharge today.   Marykay LexHARDING, Jeanna Giuffre W, M.D., M.S. Interventional Cardiologist   Pager # [phone]

## 2015-03-15 NOTE — Telephone Encounter (Signed)
[phone]  [facility]  Needs a prior authorization to be completed.  [phone] phone number  free 30 day trial offer - given to pharmacist - approved   RN will contact insurance for prior authorization Patient is aware that 30 day supply is available for pick up   patient states she will have her husband got to pharmacy for pick up.

## 2015-03-15 NOTE — Telephone Encounter (Signed)
Prior authorization done through [employer] Dx I48.91 AFIB  ( CONFIRMATION # -[id number]) GOOD THROUGH 03/14/16  PATIENT NOTIFIED AND AWARE

## 2015-03-15 NOTE — Progress Notes (Signed)
Patient being discharged to home with husband. Medications and discharge instructions have been given and husband and wife both stated that they understood. IV was taken out and was intact

## 2015-03-15 NOTE — Care Management Note (Addendum)
Case Management Note  Patient Details  Name: Beverly Li MRN: [id number] Date of Birth: 11/24/1945  Subjective/Objective:       Pt admitted with PAD             Action/Plan:  Pt is independent from home with husband.  Current Plan is to wean pt down from supplemental O2.     Expected Discharge Date:  03/10/15               Expected Discharge Plan:  Home/Self Care  In-House Referral:     Discharge planning Services  CM Consult  Post Acute Care Choice:    Choice offered to:     DME Arranged:   3:1,RW ,Oxygen DME Agency:   [facility]  HH Arranged:   PT HH Agency:   [facility]  Status of Service:  Complete, will sign off  Medicare Important Message Given:  Yes Date Medicare IM Given:    Medicare IM give by:    Date Additional Medicare IM Given:    Additional Medicare Important Message give by:     If discussed at [place] of Stay Meetings, dates discussed:    Additional Comments: 03/15/15 HH Respiratory Care order will be discontinued prior to discharge   CM met with pt and husband prior to discharge.  Pt is now agreeable to DME 3:1, oxygen  and RW from [facility].  CM contacted agencies and referral was accepted.  Pt chose [facility] for [facility], agency contacted and referral accepted.   Per husband; hospital bed in no longer needed.  03/14/15 Provided 30 day Eliquis card to pt and husband.  CM followed back with pt and husband; chose [facility], agency contacted and referral was accepted.  CM asked [facility] DME per pt request to go by room and provide information regarding purchasing preference DME.  CM assessed pt, husband at bedside.  CM offered pt choice for [facility] as recommended.  Pt and husband also requested DME and Private Duty Care list from CM to research possible DME request made by pt (3:1, PRN oxygen and rolling walker), DME is not currently recommended by in house therapy nor MD.   Pt is not interested in [facility] equipment stated they want "better" and want to look at options at brick and mortar store.   CM contacted [facility] [place] (pharamacy of choice)  CM informed pharmacy can fill prescription for Eliquis.  CM informed pt and husband that she was unable to determine how much monthly copay will be for Eliqius; insurance was unwilling to provide information to [place] was unable to determine copay without actual script, pt and husband informed CM that "money is not a problem, we can pay whatever it is).  CM will follow up with pt regarding [place] ageny of choice and continue to monitor for disposition needs. Maryclare Labrador, RN 03/15/2015, 10:02 AM

## 2015-03-15 NOTE — Progress Notes (Signed)
SATURATION QUALIFICATIONS: (This note is used to comply with regulatory documentation for home oxygen)  Patient Saturations on Room Air at Rest = 89  Patient Saturations on Room Air while Ambulating = 86  Patient Saturations on  3Liters of oxygen while Ambulating = 93  Please briefly explain why patient needs home oxygen: Patient went down to 89% while resting and 86% when ambulating and off of oxygen. She is currently at 3 liters when at rest and at 93%

## 2015-03-16 ENCOUNTER — Telehealth: Payer: Self-pay | Admitting: Vascular Surgery

## 2015-03-16 NOTE — Telephone Encounter (Addendum)
-----   Message from Sharee PimpleMarilyn K McChesney, RN sent at 03/15/2015 11:14 AM EST ----- Regarding: schedule   ----- Message -----    From: Dara LordsSamantha J Rhyne, PA-C    Sent: 03/15/2015  10:42 AM      To: Vvs Charge Pool  S/p Right femoral below knee popliteal artery bypass with composite vein graft  F/u with [facility] in 2 weeks.  Thanks, Samantha  notified patient of post op appt. on 04-04-14 at 1:15 with dr. Edilia Bodickson

## 2015-03-17 ENCOUNTER — Telehealth: Payer: Self-pay | Admitting: Cardiology

## 2015-03-17 MED ORDER — AMLODIPINE BESYLATE 5 MG PO TABS: 5.0000 mg | ORAL_TABLET | Freq: Every day | ORAL | Status: DC

## 2015-03-17 NOTE — Telephone Encounter (Signed)
    Called and spoke to Mrs. Beverly Li. She believes her lungs are improving, still with cough. Told me about hallucinations prior to auto-extubation. I expressed being pleased and thankful that she is home, undergoing PT and in her words, moving in the right direction.   Agree with ASA 81 and Eliquis as relayed with Dr. Herbie BaltimoreHarding. Thank you.  Donato SchultzSKAINS, MARK, MD

## 2015-03-17 NOTE — Telephone Encounter (Signed)
Thank you for reporting this to Dr. Anne FuSkains.  Heart rate of 50 is okay. Amlodipine was on her list of medications in the hospital and was intended to be a discharge medication  The question of aspirin plus ELIQUIS we deferred to the vascular surgeons. My recommendation would be taking 81 mg daily and not 325 mg twice a day. This was post be addressed by the vascular surgeons.  Marykay LexHARDING, Bradleigh Sonnen W, M.D., M.S. Interventional Cardiologist   Pager # [phone]

## 2015-03-17 NOTE — Telephone Encounter (Signed)
Hilda LiasMarie ([facility]) She is following patient  at present. Today's b/p 140/80, 50's  1) is the clindamycin for lung issues ( right side lung congestion- clear sputum)  Hilda LiasMarie states medication was changed from Levaquin due to interaction with another medications  2) heart rate today was 50? 3) should patient be taking amlodipine? Patient has not taking medication since hospitalization.  4) Aspirin 81 mg daily - wanted to make sure patient should be taking along with eliquis? ( discharge instruction states  Aspirin 325 mg twice a day) Hilda LiasMarie called office for clarification Dr Herbie BaltimoreHarding saw patient in the hospital stay  RN reviewed discharge note and summary. Per note 03/15/15--patient develop temp. 103 - started antibiotic, heart in hospital was in the 50's -last EKG 56 (03/09/15) Discharge instruction sheet  Medication list shows amlodipine 5 mg  Prescription e-sent to pharmacy  ( under Dr Judd GaudierSkain's name)  Will defer to Dr Herbie BaltimoreHARDING see if aspirin dosing is appropiate

## 2015-03-17 NOTE — Telephone Encounter (Signed)
Beverly Li is calling in to speak with a nurse about the pt's heart rate and some congestion. Please f/u with her  Thanks

## 2015-03-17 NOTE — Telephone Encounter (Signed)
NOTIFIED  MARIE RN OF INFORMATION VERBALIZED UNDERSTANDING. Patient has appointment 03/22/15 with Dr Anne FuSkains.

## 2015-03-20 ENCOUNTER — Telehealth: Payer: Self-pay | Admitting: Cardiology

## 2015-03-20 NOTE — Telephone Encounter (Signed)
Beverly HesselbachMaria is calling in to report a HR for the pt. Please f/u with her  Thanks

## 2015-03-20 NOTE — Telephone Encounter (Signed)
Will discuss tomorrow   HARDING, Piedad ClimesAVID W, MD

## 2015-03-20 NOTE — Telephone Encounter (Signed)
Left message to call back  

## 2015-03-20 NOTE — Telephone Encounter (Signed)
Beverly HesselbachMaria reported patient heart rate today as 45 regular Blood pressure left 150/90   Right 142/82 No symptoms at present  Time. Schedule for appointment tomorrow 03/21/15 Patient has already taken morning medication  ( daily medications)

## 2015-03-21 ENCOUNTER — Ambulatory Visit (INDEPENDENT_AMBULATORY_CARE_PROVIDER_SITE_OTHER): Payer: Medicare Other | Admitting: Cardiology

## 2015-03-21 VITALS — BP 98/62 | HR 46 | Ht 65.0 in | Wt 123.9 lb

## 2015-03-21 DIAGNOSIS — I70211 Atherosclerosis of native arteries of extremities with intermittent claudication, right leg: Secondary | ICD-10-CM

## 2015-03-21 DIAGNOSIS — I517 Cardiomegaly: Secondary | ICD-10-CM | POA: Diagnosis not present

## 2015-03-21 DIAGNOSIS — F17200 Nicotine dependence, unspecified, uncomplicated: Secondary | ICD-10-CM

## 2015-03-21 DIAGNOSIS — I10 Essential (primary) hypertension: Secondary | ICD-10-CM

## 2015-03-21 DIAGNOSIS — Z720 Tobacco use: Secondary | ICD-10-CM

## 2015-03-21 DIAGNOSIS — I480 Paroxysmal atrial fibrillation: Secondary | ICD-10-CM | POA: Diagnosis not present

## 2015-03-21 DIAGNOSIS — I5032 Chronic diastolic (congestive) heart failure: Secondary | ICD-10-CM

## 2015-03-21 DIAGNOSIS — E785 Hyperlipidemia, unspecified: Secondary | ICD-10-CM | POA: Diagnosis not present

## 2015-03-21 DIAGNOSIS — I421 Obstructive hypertrophic cardiomyopathy: Secondary | ICD-10-CM

## 2015-03-21 DIAGNOSIS — I499 Cardiac arrhythmia, unspecified: Secondary | ICD-10-CM

## 2015-03-21 DIAGNOSIS — I4891 Unspecified atrial fibrillation: Secondary | ICD-10-CM | POA: Diagnosis not present

## 2015-03-21 MED ORDER — AMIODARONE HCL 200 MG PO TABS: 200.0000 mg | ORAL_TABLET | Freq: Every day | ORAL | Status: DC

## 2015-03-21 NOTE — Patient Instructions (Signed)
FOR 1 MONTH  TAKE AMIODARONE 200 MG ONCE  A DAY THEN   FOR 1 MONTH TAKE  AMIODARONE 100 MG ( 1/2 TABLET OF 200 MG ) ONCE  A  DAY THEN STOP MEDICATIONS    CHANGE AN TAKE BYSTOLIC  AT NIGHT STARTING TOMORROW 03/22/15  TAKE AMLODIPINE IN THE MORNING.  TALK WITH YOUR PRIMARY CONCERNING YOUR OXYGEN  KEEP TRACK OF HEART RATE - AND NOTIFY OFFICE IF CONTINUE TO READ LOWER THAN 50'S  Your physician wants you to follow-up in FEB 2017 WITH DR HARDING - 30 MIN . You will receive a reminder letter in the mail two months in advance. If you don't receive a letter, please call our office to schedule the follow-up appointment.  If you need a refill on your cardiac medications before your next appointment, please call your pharmacy.

## 2015-03-21 NOTE — Progress Notes (Signed)
PCP: Clayborn Heron, MD  Clinic Note: Chief Complaint  Patient presents with  . Follow-up  . Chest Pain    no chest pain  . Shortness of Breath    no SOB, but states a couple times feeling dizzy after taking albuterol  . Edema    some edema in right foot and ankle    HPI: Beverly Li is a 69 y.o. female with a PMH below who presents today for hospital f/u - HOCM & New Dx of Afib - intraop for Fem-Pop Bypass.Beverly Li was last seen in October 2016 by Dr. Donato Schultz -- following her complicated post-op hospitalization for Fem-Pop Bypass.  Recent Hospitalizations:  November 17 30th 2016: - Admitted for planned bilateral femoropopliteal bypass  for lifestyle limiting bilateral lower extremity claudication. -- during the initial right common femoral endarterectomy and femoropopliteal bypass surgery with a composite vein graft of 2 right saphenous vein grafts segments, her operative course was complicated by multiple episodes of alternating blood pressures and hypotension and hypertension. The patient was noted to have significant hypertrophic outflow tract obstruction the left ventricle. Her pressures were very labile and cardiology was consult. Dr. Anne Fu is actually present during a good portion of the aborted operation.  Other complicating features were perioperative atrial fibrillation with RVR treated with a amiodarone. She did have some mild pulmonary edema treated with Lasix. She had mild troponin elevation consistent with ischemia.  She continued to have labile blood pressures during hospital stay, was finally transferred to the nursing forward I took over her care cardiac standpoint. She was changed to oral Lasix and oral amiodarone. She was also started on anticoagulation of this. -- She was treated with vancomycin and Levaquin for possible hospital-acquired pneumonia that was discontinued. She was then started on antibiotics for pneumonia just prior to  discharge.  She was discharged postop day 13. - on discharge, she was in normal sinus rhythm with stable blood pressures. She was on low-dose oral Lasix - there was some concern for possible reaccumulation of pulmonary edema. She was discharged on home oxygen and antibiotics for possible pneumonia.  Studies Reviewed:   Right common femoral endarterectomy with right femoropopliteal bypass  Intra-op TEE results not reported -- by bedside the colon significant MR. LV cavity obliteration. Significant AI and TR as well as PR. This improved after fluid resuscitation phenylephrine.  Significant dynamic outflow tract gradient noted -- this was during increased hyperdynamic state and stress response expected and treated by dehydration  TTE Mar 04, 2015:   Normal LV size. Moderate LVH. EF 65-70%. Pseudo-normal/grade 2 diastolic dysfunction. Mild to moderate aortic calcification. Mild LA dilation.  -- of note, no comment was made on outflow tract obstruction  Interval History: since her discharge, she states that every day she is getting gradually better. Her energy level is improved, as has her balance. Her breathing has remained stable. She has not had any PND orthopnea or edema. No chest pressure with rest or exertion. No rapid or irregular heartbeats, but does have occasional skipped beats. No syncope or near syncope. She does still get a low short of breath with exertion, but this is improving. She is still wearing oxygen, but as noted that her oxygen saturation levels have stayed above 90 with ambulation.  No TIA/amaurosis fugax symptoms. No melena, hematochezia, hematuria, or epstaxis. No claudication.  ROS: A comprehensive was performed. Review of Systems  Constitutional: Negative for malaise/fatigue.  Respiratory: Positive for cough (Improving on  a daily basis) and sputum production (Less productive than initially postop.). Negative for wheezing.   Cardiovascular: Positive for claudication  (Still has claudication on the left side, notable claudication on right).  Gastrointestinal: Negative.   Genitourinary: Negative.   Neurological: Positive for weakness (Gaining strength daily). Negative for dizziness.  Endo/Heme/Allergies: Does not bruise/bleed easily.  Psychiatric/Behavioral: Negative.   All other systems reviewed and are negative.    Past Medical History  Diagnosis Date  . Hypertension   . Insomnia   . Mild aortic sclerosis (HCC)   . Osteopenia   . Syncope     , Vagal  . GERD (gastroesophageal reflux disease)   . History of kidney stones   . Hypertrophic cardiomyopathy (HCC)     severe LV basilar hypertrophy witn no evidence of significant outflow tract obstruction, EF 65-70%, mild LAE, mild TR, grade 1a diastolic dysfunction 05/15/10 (Dr. Donato Schultz) (Atrial Septal Hypertrophy pattern)-- Intra-op TEE with dsignificant outflow tract obstruction - AI, MR & TR  . Atrial fibrillation (HCC)   . Peripheral vascular disease (HCC)   . Anxiety   . Arthritis   . Bell's palsy     when pt. was 69 yrs old, when under stress the left side of face will droop.    Past Surgical History  Procedure Laterality Date  . Hernia repair    . Breast enhancement surgery Bilateral   . Inguinal hernia repair Bilateral   . Facial cosmetic surgery    . Left ovarian cyst    . Tonsillectomy    . Peripheral vascular catheterization N/A 01/16/2015    Procedure: Abdominal Aortogram;  Surgeon: Chuck Hint, MD;  Location: [facility] INVASIVE CV LAB;  Service: Cardiovascular;  Laterality: N/A;  . Femoral-popliteal bypass graft Right 03/02/2015    Procedure: BYPASS GRAFT FEMORAL-BELOW KNEE POPLITEAL ARTERY;  Surgeon: Chuck Hint, MD;  Location: [facility] OR;  Service: Vascular;  Laterality: Right;  . Endarterectomy femoral Right 03/02/2015    Procedure: ENDARTERECTOMY RIGHT FEMORAL;  Surgeon: Chuck Hint, MD;  Location[facility] OR;  Service: Vascular;  Laterality: Right;   Prior to  Admission medications   Medication Sig Start Date End Date Taking? Authorizing Provider  amiodarone (PACERONE) 200 MG tablet Take 1 tablet (200 mg total) by mouth daily. 03/24/15  Yes Samantha J Rhyne, PA-C  amLODipine (NORVASC) 5 MG tablet Take 1 tablet (5 mg total) by mouth daily. 03/17/15  Yes [name], MD  apixaban (ELIQUIS) 5 MG TABS tablet Take 1 tablet (5 mg total) by mouth 2 (two) times daily. 03/15/15  Yes Samantha J Rhyne, PA-C  aspirin 81 MG tablet Take 81 mg by mouth daily.   Yes Historical Provider, MD  buPROPion (WELLBUTRIN XL) 150 MG 24 hr tablet TK 1 T PO IN THE MORNING ONCE A DAY 12/20/14  Yes Historical Provider, MD  clindamycin (CLEOCIN) 300 MG capsule Take 300 mg by mouth 3 (three) times daily. 03/15/15  Yes Historical Provider, MD  DEXILANT 30 MG capsule TAKE ONE CAPSULE BY MOUTH  EVERYDAY 01/17/15  Yes Historical Provider, MD  furosemide (LASIX) 20 MG tablet Take 1 tablet (20 mg total) by mouth daily as needed. 03/15/15  Yes Samantha J Rhyne, PA-C  levalbuterol (XOPENEX) 0.63 MG/3ML nebulizer solution Take 0.63 mg by nebulization as needed for wheezing or shortness of breath.   Yes Historical Provider, MD  Multiple Vitamin (MULTIVITAMIN WITH MINERALS) TABS Take 1 tablet by mouth at bedtime.   Yes Historical Provider, MD  Multiple Vitamins-Iron (  STRESS B COMPLEX/IRON) TABS Take 1 tablet by mouth daily.   Yes Historical Provider, MD  nebivolol (BYSTOLIC) 10 MG tablet Take 10 mg by mouth at bedtime.   Yes Historical Provider, MD  rosuvastatin (CRESTOR) 5 MG tablet Take 5 mg by mouth daily.   Yes Historical Provider, MD    Allergies  Allergen Reactions  . Amoxicillin Other (See Comments)    UTI  . Atenolol Cough  . Sulfa Antibiotics Nausea Only  . Codeine Rash     Social History   Social History  . Marital Status: Married    Spouse Name: N/A  . Number of Children: N/A  . Years of Education: N/A   Social History Main Topics  . Smoking status: Former Smoker -- 0.50  packs/day for 50 years    Types: Cigarettes    Quit date: 04/14/2013  . Smokeless tobacco: Never Used  . Alcohol Use: Yes     Comment: 1-2 per week  . Drug Use: No  . Sexual Activity: Not Asked   Other Topics Concern  . None   Social History Narrative    Family History  Problem Relation Age of Onset  . Liver cancer Mother   . Cancer Mother     Liver  . Hypertension Mother   . Lung cancer Father   . Cancer Father     Lung  . Breast cancer Sister   . Cancer Sister     Breast     Wt Readings from Last 3 Encounters:  03/21/15 123 lb 14.4 oz (56.201 kg)  03/12/15 129 lb 6.6 oz (58.7 kg)  02/27/15 129 lb 6.4 oz (58.695 kg)    PHYSICAL EXAM BP 98/62 mmHg  Pulse 46  Ht  (1.651 m)  Wt 123 lb 14.4 oz (56.201 kg)  BMI 20.62 kg/m2 General appearance: alert, cooperative, appears stated age, no distress and otherwise healthy appearing. O2 by nasal cannula to HEENT: /AT, EOMI, MMM, anicteric sclera -- signs of old Bell's Palsy Neck: no adenopathy, no carotid bruit and no JVD Lungs: clear to auscultation bilaterally, normal percussion bilaterally and non-labored Heart: regular rate and rhythm, S1& S2 normal, 2-3/6 SEM @ RUSB, No R/G Abdomen: soft, non-tender; bowel sounds normal; no masses,  no organomegaly; no HJR Extremities: extremities normal, atraumatic, no cyanosis, and edema  Pulses: 2+ and symmetric Skin: mobility and turgor normal Neurologic: Mental status: Alert, oriented, thought content appropriate (has signs of old Bell's Palsy    Adult ECG Report  Rate: 46 ;  Rhythm: sinus bradycardia and BiAtrial Enlargement, LAD ( -32), NS ST-T abnormalities, QT ~497 w/ ?Pulmonary pattern; Borderline 1 Deg AVB  Narrative Interpretation: QTc longer, rate slower & axis more left as compared to prior EKG   Other studies Reviewed: Additional studies/ records that were reviewed today include:  Recent Labs:     Chemistry      Component Value Date/Time   NA 143  03/14/2015 0258   K 3.3* 03/14/2015 0258   CL 104 03/14/2015 0258   CO2 30 03/14/2015 0258   BUN 21* 03/14/2015 0258   CREATININE 0.67 03/14/2015 0258      Component Value Date/Time   CALCIUM 8.6* 03/14/2015 0258   ALKPHOS 115 02/27/2015 1128   AST 23 02/27/2015 1128   ALT 18 02/27/2015 1128   BILITOT 0.4 02/27/2015 1128       ASSESSMENT / PLAN: Problem List Items Addressed This Visit    Smoker (Chronic)    Talk briefly about  smoking cessation. Will address further in followup visits.      Relevant Orders   EKG 12-Lead (Completed)   Paroxysmal atrial fibrillation with rapid ventricular response (HCC) (Chronic)    Relatively symptomatic of A. Fib in the setting of her cardiomyopathy. We'll need to ensure that she stays adequately hydrated to avoid dehydration as this tends to accentuate her labile pressures.  This patients CHA2DS2-VASc Score and unadjusted Ischemic Stroke Rate (% per year) is equal to 7.2 % stroke rate/year from a score of 5  Above score calculated as 1 point each if present [CHF, HTN, DM, Vascular=MI/PAD/Aortic Plaque, Age if 65-74, or Female] Above score calculated as 2 points each if present [Age > 75, or Stroke/TIA/TE]  - Eliquis for anticoagulation (no bleeding) - Complete Amiodarone load - now reduce to 200 mg dailyl x 1 months then 100 mg daily x 1 month before stopping. - Continue Bystolic @ current dose, for now (if HR persists < 50, will need to wean Amiodarone quicker vs. Reduce BB dose)        Relevant Medications   aspirin 81 MG tablet   amiodarone (PACERONE) 200 MG tablet   Other Relevant Orders   EKG 12-Lead (Completed)   RESOLVED: New onset atrial fibrillation (HCC) - Primary    No longer having any paroxysms of atrial fibrillation now on amiodarone. She was started on ELIQUIS in the hospital. This could have been simply postop A. Fib, versus potentially related to her cardiomyopathy.  Plan is for a short course of amiodarone with  anticoagulation. Would then stop amiodarone to see if there is any recurrence.      Relevant Medications   aspirin 81 MG tablet   amiodarone (PACERONE) 200 MG tablet   Other Relevant Orders   EKG 12-Lead (Completed)   Hypertrophic obstructive cardiomyopathy (HCC) (Chronic)    Previously asymptomatic. At this point I think continuing to treat the blood pressure is important, however we stressed the importance of adequate hydration. She currently is taking Lasix essentially when necessary now. Would want to avoid any CHF symptoms and edema, especially postoperative and postop total. I think she probably won't be media more than when necessary after that.  This was not familial in nature as far as we can tell. At baseline her outflow tract is not significant. I suspect that her decompensation Intra-Op is related to dehydration and increased  Adrenergic drive with more hyperdynamic contractions leading to worsening outflow tract obstruction.      Relevant Medications   aspirin 81 MG tablet   amiodarone (PACERONE) 200 MG tablet   Hyperlipidemia    On statin. Monitored by PCP.      Relevant Medications   aspirin 81 MG tablet   amiodarone (PACERONE) 200 MG tablet   Other Relevant Orders   EKG 12-Lead (Completed)   Essential hypertension (Chronic)    Borderline hypotensive today. She has no history of hypertension. Recommended that she switch the dosing of her systolic and amlodipine in order to  Take bystolic in the evening and amlodipine in the morning. If blood pressure continues to be reduced, one would consider holding amlodipine.      Relevant Medications   aspirin 81 MG tablet   amiodarone (PACERONE) 200 MG tablet   Other Relevant Orders   EKG 12-Lead (Completed)   Chronic diastolic heart failure (HCC) (Chronic)    This is a double-edged Sword. With her outflow flow tract obstruction we need to ensure that she is not dehydrated, butshe  does run the risk of diastolic heart failure  from hypertrophic LV.  She is on a beta blocker and amlodipine for afterload reduction. She is also on when necessary Lasix.      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 (+/- concerns) HR is 46 - ?? Med change  The following changes have been made:   FOR 1 MONTH  TAKE AMIODARONE 200 MG ONCE  A DAY THEN   FOR 1 MONTH TAKE  AMIODARONE 100 MG ( 1/2 TABLET OF 200 MG ) ONCE  A  DAY THEN STOP MEDICATIONS   CHANGE AN TAKE BYSTOLIC  AT NIGHT STARTING TOMORROW 03/22/15  TAKE AMLODIPINE IN THE MORNING.  TALK WITH YOUR PRIMARY CONCERNING YOUR OXYGEN  KEEP TRACK OF HEART RATE - AND NOTIFY OFFICE IF CONTINUE TO READ LOWER THAN 50'S  Your physician wants you to follow-up in FEB 2017 WITH DR Dee Maday - 30 MIN .  Studies Ordered:   Orders Placed This Encounter  Procedures  . EKG 12-Lead      Marykay LexHARDING, Trinaty Bundrick W, M.D., M.S. Interventional Cardiologist   Pager # [phone]

## 2015-03-22 ENCOUNTER — Encounter: Payer: Medicare Other | Admitting: Cardiology

## 2015-03-22 NOTE — Care Management Note (Addendum)
Case Management Note  Patient Details  Name: Beverly Li MRN: [id number] Date of Birth: Feb 28, 1946  Subjective/Objective:       Pt admitted with PAD             Action/Plan:  Pt is independent from home with husband.  Current Plan is to wean pt down from supplemental O2.     Expected Discharge Date:  03/10/15               Expected Discharge Plan:  Home/Self Care  In-House Referral:     Discharge planning Services  CM Consult  Post Acute Care Choice:    Choice offered to:     DME Arranged:  Walker rolling, Oxygen, 3-N-1 DME Agency:   [facility]  HH Arranged:  PT HH Agency:  [facility]  Status of Service:  Complete, will sign off  Medicare Important Message Given:  Yes Date Medicare IM Given:    Medicare IM give by:    Date Additional Medicare IM Given:    Additional Medicare Important Message give by:     If discussed at [place] of Stay Meetings, dates discussed:    Discharge Disposition:  Home with Home Health  Additional Comments: 03/15/15 [facility] order will be discontinued prior to discharge   CM met with pt and husband prior to discharge.  Pt is now agreeable to DME 3:1, oxygen  and RW from [facility].  CM contacted agencies and referral was accepted.  Pt chose [facility] for [facility], agency contacted and referral accepted.   Per husband; hospital bed in no longer needed.  03/14/15 Provided 30 day Eliquis card to pt and husband.  CM followed back with pt and husband; chose [facility], agency contacted and referral was accepted.  CM asked [facility] DME per pt request to go by room and provide information regarding purchasing preference DME.  CM assessed pt, husband at bedside.  CM offered pt choice for [facility] as recommended.  Pt and husband also requested DME and Private Duty Care list from CM to research possible DME request made by pt (3:1, PRN oxygen and rolling walker), DME is not currently recommended by in house therapy nor MD.   Pt is not interested in [facility] equipment  stated they want "better" and want to look at options at brick and mortar store.  CM contacted [facility] in [place] (pharamacy of choice)  CM informed pharmacy can fill prescription for Eliquis.  CM informed pt and husband that she was unable to determine how much monthly copay will be for Eliqius; insurance was unwilling to provide information to [place] was unable to determine copay without actual script, pt and husband informed CM that "money is not a problem, we can pay whatever it is).  CM will follow up with pt regarding [place] ageny of choice and continue to monitor for disposition needs. Maryclare Labrador, RN 03/22/2015, 12:37 PM

## 2015-03-28 ENCOUNTER — Encounter: Payer: Self-pay | Admitting: Cardiology

## 2015-03-28 DIAGNOSIS — I5033 Acute on chronic diastolic (congestive) heart failure: Secondary | ICD-10-CM | POA: Insufficient documentation

## 2015-03-28 DIAGNOSIS — I5032 Chronic diastolic (congestive) heart failure: Secondary | ICD-10-CM | POA: Insufficient documentation

## 2015-03-28 NOTE — Assessment & Plan Note (Signed)
No longer having any paroxysms of atrial fibrillation now on amiodarone. She was started on ELIQUIS in the hospital. This could have been simply postop A. Fib, versus potentially related to her cardiomyopathy.  Plan is for a short course of amiodarone with anticoagulation. Would then stop amiodarone to see if there is any recurrence.

## 2015-03-28 NOTE — Assessment & Plan Note (Signed)
On statin. Monitored by PCP. 

## 2015-03-28 NOTE — Assessment & Plan Note (Signed)
This is a double-edged Sword. With her outflow flow tract obstruction we need to ensure that she is not dehydrated, butshe does run the risk of diastolic heart failure from hypertrophic LV.  She is on a beta blocker and amlodipine for afterload reduction. She is also on when necessary Lasix.

## 2015-03-28 NOTE — Assessment & Plan Note (Signed)
Previously asymptomatic. At this point I think continuing to treat the blood pressure is important, however we stressed the importance of adequate hydration. She currently is taking Lasix essentially when necessary now. Would want to avoid any CHF symptoms and edema, especially postoperative and postop total. I think she probably won't be media more than when necessary after that.  This was not familial in nature as far as we can tell. At baseline her outflow tract is not significant. I suspect that her decompensation Intra-Op is related to dehydration and increased  Adrenergic drive with more hyperdynamic contractions leading to worsening outflow tract obstruction.

## 2015-03-28 NOTE — Assessment & Plan Note (Addendum)
Relatively symptomatic of A. Fib in the setting of her cardiomyopathy. We'll need to ensure that she stays adequately hydrated to avoid dehydration as this tends to accentuate her labile pressures.  This patients CHA2DS2-VASc Score and unadjusted Ischemic Stroke Rate (% per year) is equal to 7.2 % stroke rate/year from a score of 5  Above score calculated as 1 point each if present [CHF, HTN, DM, Vascular=MI/PAD/Aortic Plaque, Age if 65-74, or Female] Above score calculated as 2 points each if present [Age > 75, or Stroke/TIA/TE]  - Eliquis for anticoagulation (no bleeding) - Complete Amiodarone load - now reduce to 200 mg dailyl x 1 months then 100 mg daily x 1 month before stopping. - Continue Bystolic @ current dose, for now (if HR persists < 50, will need to wean Amiodarone quicker vs. Reduce BB dose)

## 2015-03-28 NOTE — Assessment & Plan Note (Signed)
Borderline hypotensive today. She has no history of hypertension. Recommended that she switch the dosing of her systolic and amlodipine in order to  Take bystolic in the evening and amlodipine in the morning. If blood pressure continues to be reduced, one would consider holding amlodipine.

## 2015-03-28 NOTE — Assessment & Plan Note (Signed)
Talk briefly about smoking cessation. Will address further in followup visits.

## 2015-03-30 ENCOUNTER — Telehealth: Payer: Self-pay | Admitting: *Deleted

## 2015-03-30 NOTE — Telephone Encounter (Signed)
Dr Clifton JamesMcAlhany spoke with pts PCP who was concerned about pt being orthostatic.  He advised for pt to decrease Bystolic to 5 mg a day and follow up early next week with either Dr Herbie BaltimoreHarding or [facility].   Pt was called to schedule the appt however husband refused appt with any APP and states they will only follow up with an MD.  Dr Clifton JamesMcAlhany aware of this and that neither Dr Herbie BaltimoreHarding or Dr Anne FuSkains (H requested Dr Anne FuSkains if Dr Herbie BaltimoreHarding not available) are in the office next week.  Pt will be scheduled for follow up with Dr Herbie BaltimoreHarding at his next available.

## 2015-03-31 ENCOUNTER — Encounter: Payer: Self-pay | Admitting: Vascular Surgery

## 2015-04-05 ENCOUNTER — Ambulatory Visit (INDEPENDENT_AMBULATORY_CARE_PROVIDER_SITE_OTHER): Payer: Medicare Other | Admitting: Vascular Surgery

## 2015-04-05 ENCOUNTER — Encounter: Payer: Self-pay | Admitting: Vascular Surgery

## 2015-04-05 ENCOUNTER — Encounter: Payer: Medicare Other | Admitting: Vascular Surgery

## 2015-04-05 VITALS — BP 113/67 | HR 52 | Temp 97.9°F | Resp 18 | Ht 65.0 in | Wt 124.8 lb

## 2015-04-05 DIAGNOSIS — Z48812 Encounter for surgical aftercare following surgery on the circulatory system: Secondary | ICD-10-CM

## 2015-04-05 NOTE — Addendum Note (Signed)
Addended by: Adria DillELDRIDGE-LEWIS, MELBA L on: 04/05/2015 02:29 PM   Modules accepted: Orders

## 2015-04-05 NOTE — Progress Notes (Signed)
Patient name: Beverly Li MRN: [id number] DOB: 04-28-1945 Sex: female  REASON FOR VISIT: follow up after right femoropopliteal bypass  HPI: Beverly Li is a 69 y.o. female who had presented with disabling claudication of the right lower extremity and a superficial femoral artery occlusion. She underwent a right common femoral artery endarterectomy and right femoral to below knee popliteal artery bypass with a composite vein graft using 2 pieces of vein from the right leg. She had significant problems with hypotension intraoperatively related to hypertrophic obstructive cardiomyopathy. She was in the intensive care unit for some time but ultimately recovered and now returns for her first outpatient visit.  She has no claudication in her right leg. She has quit smoking. She now walks on the treadmill a quarter mile at 1.8 miles per hour at a grade of 1.0. The she's walking for about 10 minutes. She has mild left calf claudication. She denies rest pain or nonhealing ulcers.  Current Outpatient Prescriptions  Medication Sig Dispense Refill  . amiodarone (PACERONE) 200 MG tablet Take 1 tablet (200 mg total) by mouth daily. 30 tablet 2  . amLODipine (NORVASC) 5 MG tablet Take 1 tablet (5 mg total) by mouth daily. 30 tablet 11  . apixaban (ELIQUIS) 5 MG TABS tablet Take 1 tablet (5 mg total) by mouth 2 (two) times daily. 60 tablet 1  . aspirin 81 MG tablet Take 81 mg by mouth daily.    [name] buPROPion (WELLBUTRIN XL) 150 MG 24 hr tablet TK 1 T PO IN THE MORNING ONCE A DAY  0  . DEXILANT 30 MG capsule TAKE ONE CAPSULE BY MOUTH  EVERYDAY  4  . levalbuterol (XOPENEX) 0.63 MG/3ML nebulizer solution Take 0.63 mg by nebulization as needed for wheezing or shortness of breath.    . Multiple Vitamin (MULTIVITAMIN WITH MINERALS) TABS Take 1 tablet by mouth at bedtime.    . nebivolol (BYSTOLIC) 10 MG tablet Take by mouth at bedtime. Takes 1/2 tablet (5 mg) once daily    . rosuvastatin (CRESTOR) 5 MG tablet  Take 5 mg by mouth daily.    . clindamycin (CLEOCIN) 300 MG capsule Take 300 mg by mouth 3 (three) times daily. Reported on 04/05/2015    . furosemide (LASIX) 20 MG tablet Take 1 tablet (20 mg total) by mouth daily as needed. (Patient not taking: Reported on 04/05/2015) 30 tablet 1  . Multiple Vitamins-Iron (STRESS B COMPLEX/IRON) TABS Take 1 tablet by mouth daily. Reported on 04/05/2015     No current facility-administered medications for this visit.    REVIEW OF SYSTEMS:   denotes positive finding,  denotes negative finding Cardiac  Comments:  Chest pain or chest pressure:    Shortness of breath upon exertion:    Short of breath when lying flat:    Irregular heart rhythm:    Constitutional    Fever or chills:      PHYSICAL EXAM: Filed Vitals:   04/05/15 1232  BP: 113/67  Pulse: 52  Temp: 97.9 F (36.6 C)  TempSrc: Oral  Resp: 18  Height:  (1.651 m)  Weight: 124 lb 12.8 oz (56.609 kg)  SpO2: 97%    GENERAL: The patient is a well-nourished female, in no acute distress. The vital signs are documented above. CARDIOVASCULAR: There is a regular rate and rhythm. PULMONARY: There is good air exchange bilaterally without wheezing or rales. Her incisions are all healing nicely. She has a palpable dorsalis pedis pulse in the right  foot.  MEDICAL ISSUES: The patient is doing well status post right femoral endarterectomy and right femoral to below knee popliteal artery bypass with a spliced vein graft. She had a very difficult postoperative course because of her hypertrophic obstructive cardiomyopathy. For this reason I would be very reluctant to consider a bypass in the left leg and we both agree on this. She feels that she can stay off the tobacco and will stay on a structured walking program. I have ordered a duplex of her right femoropopliteal bypass graft in 3 months and ABIs at that time. I will see her back at that time. She knows to call sooner if she has  problems.  Beverly Li, [facility] [place] Beeper: [phone]

## 2015-04-12 ENCOUNTER — Ambulatory Visit (INDEPENDENT_AMBULATORY_CARE_PROVIDER_SITE_OTHER): Payer: Medicare Other | Admitting: Cardiology

## 2015-04-12 ENCOUNTER — Encounter: Payer: Self-pay | Admitting: Cardiology

## 2015-04-12 VITALS — BP 124/78 | HR 46 | Ht 65.0 in | Wt 127.0 lb

## 2015-04-12 DIAGNOSIS — I421 Obstructive hypertrophic cardiomyopathy: Secondary | ICD-10-CM

## 2015-04-12 DIAGNOSIS — I480 Paroxysmal atrial fibrillation: Secondary | ICD-10-CM

## 2015-04-12 DIAGNOSIS — I495 Sick sinus syndrome: Secondary | ICD-10-CM | POA: Diagnosis not present

## 2015-04-12 NOTE — Progress Notes (Signed)
Cardiology Office Note   Date:  04/12/2015   ID:  Beverly MeekerFrances B Hoffart, DOB 02-Jan-1946, MRN [id number]  PCP:  Clayborn HeronVictoria R Rankins, MD  Cardiologist:   Donato SchultzSKAINS, MARK, MD / Herbie BaltimoreHarding      History of Present Illness: Beverly Li is a 69 y.o. female  With hypertrophic obstructive cardiomyopathy , recent severe exacerbation during femoropopliteal bypass resulting in intubation , phenylephrine , fluids and prolonged intubation here for follow-up.   Echocardiogram demonstrates 1.9 cm septal wall without any evidence of SAM  Or obstructive outflow tract gradient at rest.   Post hospital, she followed up with Dr. Herbie BaltimoreHarding who took care of her at the end of her hospitalization. His notes reviewed.   she had a short course of atrial fibrillation , placed on amiodarone short-term.   she has weaned her self off the oxygen over the past few days.  We walked around clinic, oxygen saturation 92% at the least.   She is continuing to improve. She does have a sore throat currently and she is going to see Dr. Luciana Axeankin/ walk-in clinic visit for this.     Past Medical History  Diagnosis Date  . Hypertension   . Insomnia   . Mild aortic sclerosis (HCC)   . Osteopenia   . Syncope     , Vagal  . GERD (gastroesophageal reflux disease)   . History of kidney stones   . Hypertrophic cardiomyopathy (HCC)     severe LV basilar hypertrophy witn no evidence of significant outflow tract obstruction, EF 65-70%, mild LAE, mild TR, grade 1a diastolic dysfunction 05/15/10 (Dr. Donato SchultzMark Skains) (Atrial Septal Hypertrophy pattern)-- Intra-op TEE with dsignificant outflow tract obstruction - AI, MR & TR  . Atrial fibrillation (HCC)   . Peripheral vascular disease (HCC)   . Anxiety   . Arthritis   . Bell's palsy     when pt. was 69 yrs old, when under stress the left side of face will droop.    Past Surgical History  Procedure Laterality Date  . Hernia repair    . Breast enhancement surgery Bilateral   . Inguinal  hernia repair Bilateral   . Facial cosmetic surgery    . Left ovarian cyst    . Tonsillectomy    . Peripheral vascular catheterization N/A 01/16/2015    Procedure: Abdominal Aortogram;  Surgeon: Chuck Hinthristopher S Dickson, MD;  [facility];  Service: Cardiovascular;  Laterality: N/A;  . Femoral-popliteal bypass graft Right 03/02/2015    Procedure: BYPASS GRAFT FEMORAL-BELOW KNEE POPLITEAL ARTERY;  Surgeon: Chuck Hinthristopher S Dickson, MD;  [facility];  Service: Vascular;  Laterality: Right;  . Endarterectomy femoral Right 03/02/2015    Procedure: ENDARTERECTOMY RIGHT FEMORAL;  Surgeon: Chuck Hinthristopher S Dickson, MD;  [facility];  Service: Vascular;  Laterality: Right;     Current Outpatient Prescriptions  Medication Sig Dispense Refill  . amiodarone (PACERONE) 200 MG tablet Take 1 tablet (200 mg total) by mouth daily. 30 tablet 2  . amLODipine (NORVASC) 5 MG tablet Take 1 tablet (5 mg total) by mouth daily. 30 tablet 11  . apixaban (ELIQUIS) 5 MG TABS tablet Take 1 tablet (5 mg total) by mouth 2 (two) times daily. 60 tablet 1  . aspirin 81 MG tablet Take 81 mg by mouth daily.    [name]. buPROPion (WELLBUTRIN XL) 150 MG 24 hr tablet TK 1 T PO IN THE MORNING ONCE A DAY  0  . DEXILANT 30 MG capsule TAKE ONE  CAPSULE BY MOUTH  EVERYDAY  4  . Multiple Vitamin (MULTIVITAMIN WITH MINERALS) TABS Take 1 tablet by mouth at bedtime.    . Multiple Vitamins-Iron (STRESS B COMPLEX/IRON) TABS Take 1 tablet by mouth daily. Reported on 04/05/2015    . nebivolol (BYSTOLIC) 10 MG tablet Take by mouth at bedtime. Takes 1/2 tablet (5 mg) once daily    . rosuvastatin (CRESTOR) 5 MG tablet Take 5 mg by mouth daily.     No current facility-administered medications for this visit.    Allergies:   Amoxicillin; Atenolol; Sulfa antibiotics; and Codeine    Social History:  The patient  reports that she quit smoking about 1 years ago. Her smoking use included Cigarettes. She has a 25 pack-year smoking history.  She has never used smokeless tobacco. She reports that she drinks alcohol. She reports that she does not use illicit drugs.   Family History:  The patient's family history includes Breast cancer in her sister; Cancer in her father, mother, and sister; Hypertension in her mother; Liver cancer in her mother; Lung cancer in her father.    ROS:  Please see the history of present illness.   Otherwise, review of systems are positive for none.   All other systems are reviewed and negative.    PHYSICAL EXAM: VS:  BP 124/78 mmHg  Pulse 46  Ht  (1.651 m)  Wt 127 lb (57.607 kg)  BMI 21.13 kg/m2  SpO2 94% , BMI Body mass index is 21.13 kg/(m^2). GEN: Well nourished, well developed, in no acute distress HEENT: normal Neck: no JVD, carotid bruits, or masses Cardiac: RRR; 2-3/6 systolic murmurs over precordium, no rubs, or gallops,no edema  Respiratory:  clear to auscultation bilaterally, normal work of breathing GI: soft, nontender, nondistended, + BS MS: no deformity or atrophy Skin: warm and dry, no rash, right leg wound healing well.  Neuro:  Strength and sensation are intact Psych: euthymic mood, full affect   EKG:  Prior, sinus brady   Recent Labs: 02/27/2015: ALT 18 03/14/2015: BUN 21*; Creatinine, Ser 0.67; Hemoglobin 10.6*; Magnesium 2.2; Platelets 438*; Potassium 3.3*; Sodium 143    Lipid Panel No results found for: CHOL, TRIG, HDL, CHOLHDL, VLDL, LDLCALC, LDLDIRECT    Wt Readings from Last 3 Encounters:  04/12/15 127 lb (57.607 kg)  04/05/15 124 lb 12.8 oz (56.609 kg)  03/21/15 123 lb 14.4 oz (56.201 kg)      Other studies Reviewed: Additional studies/ records that were reviewed today include:  Hospital records reviewed, echocardiogram reviewed. Review of the above records demonstrates:  As above   ASSESSMENT AND PLAN:   Hypertrophic obstructive cardiomyopathy  -  Avoid dehydration  -  At baseline, outflow tract is not significant  -  Intraoperative. With  crossclamping, vasodilatation,   Increased adrenergic drive ,Perfect storm to elucidate obstruction.  -  We will try to avoid all elective surgical procedures in the future.  -  No high-risk symptoms such as ventricular tachycardia, early cardiac death in family members , unexplained syncope , septum less than 3 cm. Murmur has been present at baseline.  -  We will go ahead and discontinue amiodarone.  -  No change in Bystolic ( decreased heart rate , decreased inotropic beneficial in her situation).  -  Okay to discontinue her oxygen.   Sore throat  -  She is going to see Dr. Josetta Huddle care   sinus bradycardia  -  Stopping amiodarone   Paroxysmal atrial fibrillation  -  Continuing with anticoagulation for now  -  Stopping amiodarone as above.  -  If symptoms arise , may need to resume antiarrhythmic therapy as HOCM patients do not tolerate atrial fibrillation well.  -  postoperative    Current medicines are reviewed at length with the patient today.  The patient does not have concerns regarding medicines.  The following changes have been made:  Stopping amiodarone  Labs/ tests ordered today include:  No orders of the defined types were placed in this encounter.     Disposition:   FU with Skains in one month  Signed, Donato Schultz, MD  04/12/2015 12:07 PM    [facility] [address] [place], [place], [place]  [address] Phone: [phone]; Fax: [phone]

## 2015-04-12 NOTE — Patient Instructions (Signed)
Medication Instructions:  Please stop your Amiodarone. Continue all other medications as listed.  Follow-Up: Follow up in 1 month with Dr Anne FuSkains.  If you need a refill on your cardiac medications before your next appointment, please call your pharmacy.  Thank you for choosing [facility]!!

## 2015-05-10 ENCOUNTER — Other Ambulatory Visit: Payer: Self-pay | Admitting: Physician Assistant

## 2015-05-11 ENCOUNTER — Other Ambulatory Visit: Payer: Self-pay | Admitting: Cardiology

## 2015-05-18 ENCOUNTER — Ambulatory Visit: Payer: Medicare Other | Admitting: Cardiology

## 2015-05-23 ENCOUNTER — Ambulatory Visit: Payer: Medicare Other | Admitting: Cardiology

## 2015-05-25 ENCOUNTER — Ambulatory Visit: Payer: Medicare Other | Admitting: Cardiology

## 2015-06-05 ENCOUNTER — Ambulatory Visit (INDEPENDENT_AMBULATORY_CARE_PROVIDER_SITE_OTHER): Payer: Medicare Other | Admitting: Cardiology

## 2015-06-05 ENCOUNTER — Encounter: Payer: Self-pay | Admitting: Cardiology

## 2015-06-05 VITALS — BP 140/84 | HR 60 | Ht 66.0 in | Wt 131.0 lb

## 2015-06-05 DIAGNOSIS — I739 Peripheral vascular disease, unspecified: Secondary | ICD-10-CM | POA: Diagnosis not present

## 2015-06-05 DIAGNOSIS — E785 Hyperlipidemia, unspecified: Secondary | ICD-10-CM | POA: Diagnosis not present

## 2015-06-05 DIAGNOSIS — I4891 Unspecified atrial fibrillation: Secondary | ICD-10-CM

## 2015-06-05 DIAGNOSIS — Z889 Allergy status to unspecified drugs, medicaments and biological substances status: Secondary | ICD-10-CM

## 2015-06-05 DIAGNOSIS — I421 Obstructive hypertrophic cardiomyopathy: Secondary | ICD-10-CM

## 2015-06-05 DIAGNOSIS — Z789 Other specified health status: Secondary | ICD-10-CM

## 2015-06-05 MED ORDER — PRAVASTATIN SODIUM 20 MG PO TABS: 20.0000 mg | ORAL_TABLET | Freq: Every evening | ORAL | Status: DC

## 2015-06-05 NOTE — Progress Notes (Signed)
Cardiology Office Note   Date:  06/05/2015   ID:  Beverly Li, Beverly Li 11/04/1945, MRN [id number]  PCP:  Beverly Heron, MD  Cardiologist:   Beverly Schultz, MD / Beverly Li      History of Present Illness: Beverly Li is a 70 y.o. female  With hypertrophic obstructive cardiomyopathy , recent severe exacerbation during femoropopliteal bypass resulting in intubation , phenylephrine , fluids and prolonged intubation here for follow-up.   Echocardiogram demonstrates 1.9 cm septal wall without any evidence of SAM  Or obstructive outflow tract gradient at rest.   Post hospital, she followed up with Dr. Herbie Li who took care of her at the end of her hospitalization. His notes reviewed.   she had a short course of atrial fibrillation , placed on amiodarone short-term.   She's had some difficulty with Crestor intolerance, statin intolerance. Trying to get her shirt off, her shoulders would ache. Since stopping the Crestor. She feels better.  She is willing to try pravastatin.   she is going back to [place], husband Beverly Li is there.  She referred a friend to our clinic.     Past Medical History  Diagnosis Date  . Hypertension   . Insomnia   . Mild aortic sclerosis (HCC)   . Osteopenia   . Syncope     , Vagal  . GERD (gastroesophageal reflux disease)   . History of kidney stones   . Hypertrophic cardiomyopathy (HCC)     severe LV basilar hypertrophy witn no evidence of significant outflow tract obstruction, EF 65-70%, mild LAE, mild TR, grade 1a diastolic dysfunction 05/15/10 (Dr. Donato Li) (Atrial Septal Hypertrophy pattern)-- Intra-op TEE with dsignificant outflow tract obstruction - AI, MR & TR  . Atrial fibrillation (HCC)   . Peripheral vascular disease (HCC)   . Anxiety   . Arthritis   . Bell's palsy     when pt. was 70 yrs old, when under stress the left side of face will droop.    Past Surgical History  Procedure Laterality Date  . Hernia repair    .  Breast enhancement surgery Bilateral   . Inguinal hernia repair Bilateral   . Facial cosmetic surgery    . Left ovarian cyst    . Tonsillectomy    . Peripheral vascular catheterization N/A 01/16/2015    Procedure: Abdominal Aortogram;  Surgeon: Chuck Hint, MD;  [facility] INVASIVE CV LAB;  Service: Cardiovascular;  Laterality: N/A;  . Femoral-popliteal bypass graft Right 03/02/2015    Procedure: BYPASS GRAFT FEMORAL-BELOW KNEE POPLITEAL ARTERY;  Surgeon: Chuck Hint, MD;  Location: [facility];  Service: Vascular;  Laterality: Right;  . Endarterectomy femoral Right 03/02/2015    Procedure: ENDARTERECTOMY RIGHT FEMORAL;  Surgeon: Chuck Hint, MD;  Location: [facility] OR;  Service: Vascular;  Laterality: Right;     Current Outpatient Prescriptions  Medication Sig Dispense Refill  . amLODipine (NORVASC) 5 MG tablet Take 1 tablet (5 mg total) by mouth daily. 30 tablet 11  . aspirin 81 MG tablet Take 81 mg by mouth daily.    [name] buPROPion (WELLBUTRIN XL) 150 MG 24 hr tablet TK 1 T PO IN THE MORNING ONCE A DAY  0  . DEXILANT 30 MG capsule TAKE ONE CAPSULE BY MOUTH  EVERYDAY  4  . ELIQUIS 5 MG TABS tablet TAKE 1 TABLET BY MOUTH TWICE DAILY 60 tablet 5  . Multiple Vitamin (MULTIVITAMIN WITH MINERALS) TABS Take 1 tablet by mouth at  bedtime.    . Multiple Vitamins-Iron (STRESS B COMPLEX/IRON) TABS Take 1 tablet by mouth daily. Reported on 04/05/2015    . nebivolol (BYSTOLIC) 10 MG tablet Take by mouth at bedtime. Takes 1/2 tablet (5 mg) once daily     No current facility-administered medications for this visit.    Allergies:   Amoxicillin; Atenolol; Sulfa antibiotics; and Codeine    Social History:  The patient  reports that she quit smoking about 2 years ago. Her smoking use included Cigarettes. She has a 25 pack-year smoking history. She has never used smokeless tobacco. She reports that she drinks alcohol. She reports that she does not use illicit drugs.   Family History:   The patient's family history includes Breast cancer in her sister; Cancer in her father, mother, and sister; Hypertension in her mother; Liver cancer in her mother; Lung cancer in her father.    ROS:  Please see the history of present illness.   Otherwise, review of systems are positive for none.   All other systems are reviewed and negative.    PHYSICAL EXAM: VS:  BP 140/84 mmHg  Pulse 60  Ht 5\' 6"  (1.676 m)  Wt 131 lb (59.421 kg)  BMI 21.15 kg/m2  SpO2 95% , BMI Body mass index is 21.15 kg/(m^2). GEN: Well nourished, well developed, in no acute distress HEENT: normal Neck: no JVD, carotid bruits, or masses Cardiac: RRR; 2-3/6 systolic murmurs over precordium, no rubs, or gallops,no edema  Respiratory:  clear to auscultation bilaterally, normal work of breathing GI: soft, nontender, nondistended, + BS MS: no deformity or atrophy Skin: warm and dry, no rash, right leg wound healing well.  Neuro:  Strength and sensation are intact Psych: euthymic mood, full affect   EKG:  Prior, sinus brady   Recent Labs: 02/27/2015: ALT 18 03/14/2015: BUN 21*; Creatinine, Ser 0.67; Hemoglobin 10.6*; Magnesium 2.2; Platelets 438*; Potassium 3.3*; Sodium 143    Lipid Panel No results found for: CHOL, TRIG, HDL, CHOLHDL, VLDL, LDLCALC, LDLDIRECT    Wt Readings from Last 3 Encounters:  06/05/15 131 lb (59.421 kg)  04/12/15 127 lb (57.607 kg)  04/05/15 124 lb 12.8 oz (56.609 kg)      Other studies Reviewed: Additional studies/ records that were reviewed today include:  Hospital records reviewed, echocardiogram reviewed. Review of the above records demonstrates:  As above   ASSESSMENT AND PLAN:   Hypertrophic obstructive cardiomyopathy  -  Avoid dehydration  -  At baseline, outflow tract is not significant  -  Intraoperative. With crossclamping, vasodilatation,   Increased adrenergic drive ,Perfect storm to elucidate obstruction.  -  We will try to avoid all elective surgical  procedures in the future.  -  No high-risk symptoms such as ventricular tachycardia, early cardiac death in family members , unexplained syncope , septum less than 3 cm. Murmur has been present at baseline.   -  No change in Bystolic ( decreased heart rate , decreased inotropic beneficial in her situation).    Sore throat  -   Still having a little bit of a sore throat especially at night. I wonder if this is  GERD related?   sinus bradycardia  -  Stopped amiodarone  -  Li, no symptoms. Continue with Bystolic 5   Paroxysmal atrial fibrillation  -  Continuing with anticoagulation  -  Stopping amiodarone as above.  -  If symptoms arise , may need to resume antiarrhythmic therapy as HOCM patients do not tolerate atrial fibrillation  well.  -  postoperative   Statin intolerance  -  Did not tolerate Crestor well. When raising her hands above her head her shoulders ached. Since she is stopped, she feels better. Given her peripheral vascular disease we would like her on statin therapy.  -  I will start pravastatin 20 mg once a day. We will check lipids and ALT when she returns.  Current medicines are reviewed at length with the patient today.  The patient does not have concerns regarding medicines.  The following changes have been made:   Stopping Crestor, starting pravastatin.  Labs/ tests ordered today include:  No orders of the defined types were placed in this encounter.     Disposition:   FU with Skains in 6 months  Signed, Beverly Schultz, MD  06/05/2015 2:41 PM    [facility] [address] [place], [place], [place]  [address] Phone: [phone]; Fax: [phone]

## 2015-06-05 NOTE — Patient Instructions (Addendum)
Medication Instructions:  Please stop your Crestor and start Pravastatin 20 mg a day. Continue all other medications as listed.  Follow-Up: Follow up in 6 mnths with Dr. Anne Fu.  You will receive a letter in the mail 2 months before you are due.  Please call us when you receive this letter to schedule your follow up appointment.  If you need a refill on your cardiac medications before your next appointment, please call your pharmacy.  Thank you for choosing [facility]!!

## 2015-06-28 ENCOUNTER — Encounter: Payer: Self-pay | Admitting: Vascular Surgery

## 2015-07-05 ENCOUNTER — Ambulatory Visit (INDEPENDENT_AMBULATORY_CARE_PROVIDER_SITE_OTHER): Payer: Medicare Other | Admitting: Vascular Surgery

## 2015-07-05 ENCOUNTER — Ambulatory Visit (INDEPENDENT_AMBULATORY_CARE_PROVIDER_SITE_OTHER)
Admission: RE | Admit: 2015-07-05 | Discharge: 2015-07-05 | Disposition: A | Discharge status: Home / Self Care | Payer: Medicare Other | Source: Ambulatory Visit | Attending: Vascular Surgery | Admitting: Vascular Surgery

## 2015-07-05 ENCOUNTER — Ambulatory Visit (HOSPITAL_COMMUNITY)
Admission: RE | Admit: 2015-07-05 | Discharge: 2015-07-05 | Disposition: A | Discharge status: Home / Self Care | Payer: Medicare Other | Source: Ambulatory Visit | Attending: Vascular Surgery | Admitting: Vascular Surgery

## 2015-07-05 ENCOUNTER — Encounter: Payer: Self-pay | Admitting: Vascular Surgery

## 2015-07-05 VITALS — BP 138/85 | HR 50 | Ht 66.0 in | Wt 132.0 lb

## 2015-07-05 DIAGNOSIS — I739 Peripheral vascular disease, unspecified: Secondary | ICD-10-CM | POA: Diagnosis not present

## 2015-07-05 DIAGNOSIS — Z48812 Encounter for surgical aftercare following surgery on the circulatory system: Secondary | ICD-10-CM | POA: Diagnosis not present

## 2015-07-05 DIAGNOSIS — I70209 Unspecified atherosclerosis of native arteries of extremities, unspecified extremity: Secondary | ICD-10-CM | POA: Diagnosis not present

## 2015-07-05 DIAGNOSIS — K219 Gastro-esophageal reflux disease without esophagitis: Secondary | ICD-10-CM | POA: Diagnosis not present

## 2015-07-05 DIAGNOSIS — I422 Other hypertrophic cardiomyopathy: Secondary | ICD-10-CM | POA: Diagnosis not present

## 2015-07-05 DIAGNOSIS — I119 Hypertensive heart disease without heart failure: Secondary | ICD-10-CM | POA: Diagnosis not present

## 2015-07-05 DIAGNOSIS — G4700 Insomnia, unspecified: Secondary | ICD-10-CM | POA: Insufficient documentation

## 2015-07-05 DIAGNOSIS — R0989 Other specified symptoms and signs involving the circulatory and respiratory systems: Secondary | ICD-10-CM | POA: Diagnosis present

## 2015-07-05 NOTE — Progress Notes (Signed)
[facility] [place]  Patient name: Beverly Li MRN: [id number] DOB: 1946-02-07 Sex: female  REASON FOR VISIT: follow up after right femoropopliteal bypass.  HPI: Beverly Li is a 70 y.o. female who had presented with disabling claudication of the right lower extremity and a superficial femoral artery occlusion. She underwent a right common femoral artery endarterectomy and right femoral to below knee popliteal artery bypass with a composite graft using 2 pieces of vein. 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 Her postoperative course was complicated ultimately she recovered and was doing well when I saw her last on 04/05/2015. I saw her last she was walking on the treadmill for a quarter of a mile. She had some mild left calf claudication but no rest pain or nonhealing ulcers. On exam she had a dorsalis pedis pulse in the right foot. She comes in for 3 month follow up visit.  Since I saw her last, she has no claudication on either side. Her symptoms on the left hip improved significantly. She quit smoking and she ambulates every day either on the treadmill or outside with her dog. Overall she's been doing very well. She denies rest pain or nonhealing ulcers.  Past Medical History  Diagnosis Date  . Hypertension   . Insomnia   . Mild aortic sclerosis (HCC)   . Osteopenia   . Syncope     , Vagal  . GERD (gastroesophageal reflux disease)   . History of kidney stones   . Hypertrophic cardiomyopathy (HCC)     severe LV basilar hypertrophy witn no evidence of significant outflow tract obstruction, EF 65-70%, mild LAE, mild TR, grade 1a diastolic dysfunction 05/15/10 (Dr. Donato SchultzMark Skains) (Atrial Septal Hypertrophy pattern)-- Intra-op TEE with dsignificant outflow tract obstruction - AI, MR & TR  . Atrial fibrillation (HCC)   . Peripheral vascular disease (HCC)   . Anxiety   . Arthritis   . Bell's palsy    when pt. was 70 yrs old, when under stress the left side of face will droop.    Family History  Problem Relation Age of Onset  . Liver cancer Mother   . Cancer Mother     Liver  . Hypertension Mother   . Lung cancer Father   . Cancer Father     Lung  . Breast cancer Sister   . Cancer Sister     Breast    SOCIAL HISTORY: Social History  Substance Use Topics  . Smoking status: Former Smoker -- 0.50 packs/day for 50 years    Types: Cigarettes    Quit date: 04/14/2013  . Smokeless tobacco: Never Used  . Alcohol Use: Yes     Comment: 1-2 per week    Allergies  Allergen Reactions  . Amoxicillin Other (See Comments)    UTI  . Atenolol Cough  . Sulfa Antibiotics Nausea Only  . Codeine Rash    Current Outpatient Prescriptions  Medication Sig Dispense Refill  . amLODipine (NORVASC) 5 MG tablet Take 1 tablet (5 mg total) by mouth daily. 30 tablet 11  . aspirin 81 MG tablet Take 81 mg by mouth daily.    [name]. buPROPion (WELLBUTRIN XL) 150 MG 24 hr tablet TK 1 T PO IN THE MORNING ONCE A DAY  0  . DEXILANT 30 MG capsule TAKE ONE CAPSULE BY MOUTH  EVERYDAY  4  . ELIQUIS 5 MG TABS tablet TAKE 1 TABLET BY MOUTH TWICE DAILY 60  tablet 5  . Multiple Vitamin (MULTIVITAMIN WITH MINERALS) TABS Take 1 tablet by mouth at bedtime.    . Multiple Vitamins-Iron (STRESS B COMPLEX/IRON) TABS Take 1 tablet by mouth daily. Reported on 04/05/2015    . nebivolol (BYSTOLIC) 10 MG tablet Take by mouth at bedtime. Takes 1/2 tablet (5 mg) once daily    . pravastatin (PRAVACHOL) 20 MG tablet Take 1 tablet (20 mg total) by mouth every evening. 30 tablet 11  . zolpidem (AMBIEN) 5 MG tablet      No current facility-administered medications for this visit.    REVIEW OF SYSTEMS:   denotes positive finding,  denotes negative finding Cardiac  Comments:  Chest pain or chest pressure:    Shortness of breath upon exertion: X   Short of breath when lying flat:    Irregular heart rhythm:        Vascular     Pain in calf, thigh, or hip brought on by ambulation: X Left calf  Pain in feet at night that wakes you up from your sleep:     Blood clot in your veins:    Leg swelling:  X       Pulmonary    Oxygen at home:    Productive cough:     Wheezing:         Neurologic    Sudden weakness in arms or legs:     Sudden numbness in arms or legs:     Sudden onset of difficulty speaking or slurred speech:    Temporary loss of vision in one eye:     Problems with dizziness:         Gastrointestinal    Blood in stool:     Vomited blood:         Genitourinary    Burning when urinating:     Blood in urine:        Psychiatric    Major depression:         Hematologic    Bleeding problems:    Problems with blood clotting too easily:        Skin    Rashes or ulcers:        Constitutional    Fever or chills:      PHYSICAL EXAM: Filed Vitals:   07/05/15 1440  BP: 138/85  Pulse: 50  Height:  (1.676 m)  Weight: 132 lb (59.875 kg)  SpO2: 93%    GENERAL: The patient is a well-nourished female, in no acute distress. The vital signs are documented above. CARDIAC: There is a regular rate and rhythm.  VASCULAR: I do not detect carotid bruits. On the right side, she has a palpable femoral, popliteal, and dorsalis pedis pulse. On the left side, she has a palpable femoral pulse. I cannot palpate pedal pulses. She has no significant lower extremity swelling. PULMONARY: There is good air exchange bilaterally without wheezing or rales. ABDOMEN: Soft and non-tender with normal pitched bowel sounds.  MUSCULOSKELETAL: There are no major deformities or cyanosis. NEUROLOGIC: No focal weakness or paresthesias are detected. SKIN: There are no ulcers or rashes noted. PSYCHIATRIC: The patient has a normal affect.  DATA:   LOWER EXTREMITY GRAFT DUPLEX: I have been apparently interpreted her graft duplex of her right femoropopliteal bypass graft. She has triphasic Doppler signals throughout the  graft with an ABI of 100%. On the left side she has monophasic signals in the left foot, with an ABI of 67%.  MEDICAL  ISSUES:  STATUS POST RIGHT FEMOROPOPLITEAL BYPASS: The patient is doing remarkably well status post her right femoropopliteal bypass. Her graft is working well with a palpable dorsalis pedis pulse. Her ABI on the left has improved and her symptoms have essentially resolved on the left. Fortunately I think she will not require revascularization on the left leg which is certainly encouraging given her cardiac issues She she quit smoking. She is highly motivated and walks every day. I ordered a follow duplex and ABIs in 6 months and I'll see her back at that time. She has to call sooner if she has problems. She is on aspirin and is on a statin. In addition she is on Eliquis.  Waverly Ferrari [facility] [place] Beeper: [phone]

## 2015-07-06 ENCOUNTER — Other Ambulatory Visit: Payer: Self-pay | Admitting: *Deleted

## 2015-07-06 DIAGNOSIS — I739 Peripheral vascular disease, unspecified: Secondary | ICD-10-CM

## 2015-09-19 ENCOUNTER — Telehealth: Payer: Self-pay | Admitting: Cardiology

## 2015-09-19 NOTE — Telephone Encounter (Signed)
New Message  Pt c/o Shortness Of Breath: STAT if SOB developed within the last 24 hours or pt is noticeably SOB on the phone  1. Are you currently SOB (can you hear that pt is SOB on the phone)? no  2. How long have you been experiencing SOB? Several weeks, 2-3 weeks  3. Are you SOB when sitting or when up moving around? When climbing stairs, moving around  4. Are you currently experiencing any other symptoms? No   Pt states first avail appt. Is to far out and wants to speak with RN about scheduling a soon appt. Please call back to advise.

## 2015-09-19 NOTE — Telephone Encounter (Signed)
Spoke with pt about her s/s.  She reports her weight is up since January but she feels that is r/t her eating more now that she is feeling better.  She does have edema in one ankle on occasion that resolves on it's own.  Her SOB is with activity only however she reports it is progressively getting worse.  She feels she is on too much medication.  She is scheduled to see her PCP tomorrow and will do so. She will have her PCP call to discuss her case with Dr Anne FuSkains if needed.  Scheduled appt for her to see Dr Anne FuSkains.

## 2015-09-28 ENCOUNTER — Ambulatory Visit (INDEPENDENT_AMBULATORY_CARE_PROVIDER_SITE_OTHER): Payer: Medicare Other | Admitting: Cardiology

## 2015-09-28 ENCOUNTER — Encounter: Payer: Self-pay | Admitting: Cardiology

## 2015-09-28 VITALS — BP 128/82 | HR 51 | Ht 65.0 in | Wt 133.0 lb

## 2015-09-28 DIAGNOSIS — I10 Essential (primary) hypertension: Secondary | ICD-10-CM

## 2015-09-28 DIAGNOSIS — I421 Obstructive hypertrophic cardiomyopathy: Secondary | ICD-10-CM

## 2015-09-28 DIAGNOSIS — R0600 Dyspnea, unspecified: Secondary | ICD-10-CM

## 2015-09-28 DIAGNOSIS — I739 Peripheral vascular disease, unspecified: Secondary | ICD-10-CM

## 2015-09-28 DIAGNOSIS — I480 Paroxysmal atrial fibrillation: Secondary | ICD-10-CM | POA: Diagnosis not present

## 2015-09-28 DIAGNOSIS — J449 Chronic obstructive pulmonary disease, unspecified: Secondary | ICD-10-CM | POA: Diagnosis not present

## 2015-09-28 NOTE — Patient Instructions (Signed)
Medication Instructions:  Please stop Amlodipine. Continue all other medications as listed.  You have been referred to Pulmonary.  Follow-Up: Follow up in 6 months with Dr. Anne FuSkains.  You will receive a letter in the mail 2 months before you are due.  Please call us when you receive this letter to schedule your follow up appointment.  If you need a refill on your cardiac medications before your next appointment, please call your pharmacy.  Thank you for choosing [facility]!!

## 2015-09-28 NOTE — Progress Notes (Signed)
Cardiology Office Note   Date:  09/28/2015   ID:  Beverly Li, DOB [birth year], MRN [id number]  PCP:  Clayborn HeronVictoria R Rankins, MD  Cardiologist:   Donato SchultzMark Skains, MD / Herbie BaltimoreHarding      History of Present Illness: Beverly Li is a 70 y.o. female  with hypertrophic obstructive cardiomyopathy , recent severe exacerbation during femoropopliteal bypass resulting in intubation , phenylephrine , fluids and prolonged intubation here for follow-up.  She reports that her weight has been up since January and she feels as though this may be related to higher caloric intake. She has edema in one ankle periodically that resolves on its own. Her shortness of breath with activity seems to be progressively getting worse however. She feels like she is on too much medication.   Echocardiogram demonstrates 1.9 cm septal wall without any evidence of SAM  Or obstructive outflow tract gradient at rest.   Post hospital, she followed up with Dr. Herbie BaltimoreHarding who took care of her at the end of her hospitalization. His notes reviewed.   She had a short course of atrial fibrillation , placed on amiodarone short-term.   She's had some difficulty with Crestor intolerance, statin intolerance. Trying to get her shirt off, her shoulders would ache. Since stopping the Crestor. She feels better.  She is willing to try pravastatin once again. Admits that she is not very eager to take.      Past Medical History  Diagnosis Date  . Hypertension   . Insomnia   . Mild aortic sclerosis (HCC)   . Osteopenia   . Syncope     , Vagal  . GERD (gastroesophageal reflux disease)   . History of kidney stones   . Hypertrophic cardiomyopathy (HCC)     severe LV basilar hypertrophy witn no evidence of significant outflow tract obstruction, EF 65-70%, mild LAE, mild TR, grade 1a diastolic dysfunction 05/15/10 (Dr. Donato SchultzMark Skains) (Atrial Septal Hypertrophy pattern)-- Intra-op TEE with dsignificant outflow tract obstruction - AI, MR & TR  .  Atrial fibrillation (HCC)   . Peripheral vascular disease (HCC)   . Anxiety   . Arthritis   . Bell's palsy     when pt. was 70 yrs old, when under stress the left side of face will droop.    Past Surgical History  Procedure Laterality Date  . Hernia repair    . Breast enhancement surgery Bilateral   . Inguinal hernia repair Bilateral   . Facial cosmetic surgery    . Left ovarian cyst    . Tonsillectomy    . Peripheral vascular catheterization N/A 01/16/2015    Procedure: Abdominal Aortogram;  Surgeon: Chuck Hinthristopher S Dickson, MD;  [facility];  Service: Cardiovascular;  Laterality: N/A;  . Femoral-popliteal bypass graft Right 03/02/2015    Procedure: BYPASS GRAFT FEMORAL-BELOW KNEE POPLITEAL ARTERY;  Surgeon: Chuck Hinthristopher S Dickson, MD;  [facility];  Service: Vascular;  Laterality: Right;  . Endarterectomy femoral Right 03/02/2015    Procedure: ENDARTERECTOMY RIGHT FEMORAL;  Surgeon: Chuck Hinthristopher S Dickson, MD;  [facility];  Service: Vascular;  Laterality: Right;     Current Outpatient Prescriptions  Medication Sig Dispense Refill  . aspirin 81 MG tablet Take 81 mg by mouth daily.    [name]. buPROPion (WELLBUTRIN XL) 150 MG 24 hr tablet Take one by mouth in the morning daily  0  . ELIQUIS 5 MG TABS tablet TAKE 1 TABLET BY MOUTH TWICE DAILY 60 tablet 5  .  Multiple Vitamin (MULTIVITAMIN WITH MINERALS) TABS Take 1 tablet by mouth at bedtime.    . Multiple Vitamins-Iron (STRESS B COMPLEX/IRON) TABS Take 1 tablet by mouth daily. Reported on 04/05/2015    . nebivolol (BYSTOLIC) 10 MG tablet Take by mouth at bedtime. Takes 1/2 tablet (5 mg) once daily    . zolpidem (AMBIEN) 5 MG tablet 5 mg at bedtime as needed for sleep.      No current facility-administered medications for this visit.    Allergies:   Amoxicillin; Atenolol; Crestor; Pravastatin; Sulfa antibiotics; and Codeine    Social History:  The patient  reports that she quit smoking about 2 years ago. Her  smoking use included Cigarettes. She has a 25 pack-year smoking history. She has never used smokeless tobacco. She reports that she drinks alcohol. She reports that she does not use illicit drugs.   Family History:  The patient's family history includes Breast cancer in her sister; Cancer in her father, mother, and sister; Hypertension in her mother; Liver cancer in her mother; Lung cancer in her father.    ROS:  Please see the history of present illness.   Otherwise, review of systems are positive for none.   All other systems are reviewed and negative.    PHYSICAL EXAM: VS:  BP 128/82 mmHg  Pulse 51  Ht  (1.651 m)  Wt 133 lb (60.328 kg)  BMI 22.13 kg/m2 , BMI Body mass index is 22.13 kg/(m^2). GEN: Well nourished, well developed, in no acute distress HEENT: normal Neck: no JVD, carotid bruits, or masses Cardiac: RRR; 2-3/6 systolic murmurs over precordium, no rubs, or gallops,no edema  Respiratory:  clear to auscultation bilaterally, normal work of breathing GI: soft, nontender, nondistended, + BS MS: no deformity or atrophy Skin: warm and dry, no rash, right leg wound healing well.  Neuro:  Strength and sensation are intact Psych: euthymic mood, full affect   EKG:  Prior, sinus brady   Recent Labs: 02/27/2015: ALT 18 03/14/2015: BUN 21*; Creatinine, Ser 0.67; Hemoglobin 10.6*; Magnesium 2.2; Platelets 438*; Potassium 3.3*; Sodium 143    Lipid Panel No results found for: CHOL, TRIG, HDL, CHOLHDL, VLDL, LDLCALC, LDLDIRECT    Wt Readings from Last 3 Encounters:  09/28/15 133 lb (60.328 kg)  07/05/15 132 lb (59.875 kg)  06/05/15 131 lb (59.421 kg)      Other studies Reviewed: Additional studies/ records that were reviewed today include:  Hospital records reviewed, echocardiogram reviewed. Review of the above records demonstrates:  As above   ASSESSMENT AND PLAN:   Hypertrophic obstructive cardiomyopathy/Dyspnea on exertion  -  Avoid dehydration  -  At  baseline, outflow tract is not significant  -  Intraoperative. With crossclamping, vasodilatation,   Increased adrenergic drive ,Perfect storm to elucidate obstruction.  -  We will try to avoid all elective surgical procedures in the future.  -  No high-risk symptoms such as ventricular tachycardia, early cardiac death in family members , unexplained syncope , septum less than 3 cm. Murmur has been present at baseline.   -  No change in Bystolic (decreased heart rate , decreased inotropic beneficial in her situation).  - Dyspnea-Prior extensive smoking history- PFTs/pulmonary evaluation, will refer. Chest x-ray PA and lateral from 09/19/12 was interpreted as underlying COPD.   - We'll stop amlodipine. This may be contributing to lower extremity edema as well.      Sinus bradycardia  -  Stopped amiodarone  -  Stable, no symptoms. Continue with Bystolic  5   Paroxysmal atrial fibrillation  -  Continuing with anticoagulation  -  Stopping amiodarone as above.  -  If symptoms arise, may need to resume antiarrhythmic therapy as HOCM patients do not tolerate atrial fibrillation well.  -  postoperative   Statin intolerance  -  Did not tolerate Crestor well. When raising her hands above her head her shoulders ached. Since she is stopped, she feels better. Given her peripheral vascular disease we would like her on statin therapy. She wonders if her hip pain or bursitis with statin related. However, she was holding her grandchild on that hip for quite some time.  -  Encouraged pravastatin 20 mg once a day.   Current medicines are reviewed at length with the patient today.  The patient does not have concerns regarding medicines.  The following changes have been made:   Encouraged pravastatin.  Labs/ tests ordered today include:   Orders Placed This Encounter  Procedures  . Ambulatory referral to Pulmonology    Disposition:   FU with Skains in 6 months  Signed, Donato Schultz, MD  09/28/2015 12:07  PM    [facility] [address] [place], [place], [place]  [address] Phone: [phone]; Fax: [phone]

## 2015-11-24 ENCOUNTER — Other Ambulatory Visit: Payer: Self-pay | Admitting: Cardiology

## 2015-11-27 ENCOUNTER — Other Ambulatory Visit: Payer: Self-pay | Admitting: *Deleted

## 2015-11-27 MED ORDER — APIXABAN 5 MG PO TABS: 5.0000 mg | ORAL_TABLET | Freq: Two times a day (BID) | ORAL | 11 refills | Status: DC

## 2015-11-27 NOTE — Telephone Encounter (Signed)
Yes - OK for Dr Anne FuSkains

## 2015-11-27 NOTE — Telephone Encounter (Signed)
Patient stated that she is no longer seeing Dr Herbie BaltimoreHarding, who originally prescribed this for her. Ok to refill under Dr Anne FuSkains?

## 2015-12-04 ENCOUNTER — Institutional Professional Consult (permissible substitution): Payer: Medicare Other | Admitting: Emergency Medicine

## 2015-12-12 ENCOUNTER — Institutional Professional Consult (permissible substitution): Payer: Medicare Other | Admitting: Emergency Medicine

## 2016-01-10 ENCOUNTER — Ambulatory Visit: Payer: Medicare Other | Admitting: Vascular Surgery

## 2016-01-10 ENCOUNTER — Ambulatory Visit (HOSPITAL_COMMUNITY): Payer: Medicare Other

## 2016-01-26 ENCOUNTER — Ambulatory Visit (INDEPENDENT_AMBULATORY_CARE_PROVIDER_SITE_OTHER): Payer: Medicare Other | Admitting: Emergency Medicine

## 2016-01-26 ENCOUNTER — Encounter: Payer: Self-pay | Admitting: Emergency Medicine

## 2016-01-26 VITALS — BP 136/80 | HR 50 | Ht 65.0 in | Wt 134.0 lb

## 2016-01-26 DIAGNOSIS — F17201 Nicotine dependence, unspecified, in remission: Secondary | ICD-10-CM

## 2016-01-26 DIAGNOSIS — Z23 Encounter for immunization: Secondary | ICD-10-CM | POA: Diagnosis not present

## 2016-01-26 DIAGNOSIS — R0602 Shortness of breath: Secondary | ICD-10-CM | POA: Insufficient documentation

## 2016-01-26 DIAGNOSIS — J449 Chronic obstructive pulmonary disease, unspecified: Secondary | ICD-10-CM | POA: Diagnosis not present

## 2016-01-26 DIAGNOSIS — R0600 Dyspnea, unspecified: Secondary | ICD-10-CM | POA: Diagnosis not present

## 2016-01-26 NOTE — Progress Notes (Signed)
Subjective:    Patient ID: Beverly Li, female    DOB: [birth year], 70 y.o.   MRN: [id number]  HPI 70 yo woman former smoker (50 pack-yrs, quit about a year ago) with hx HOCM, managed by Dr Anne FuSkains. She has also had some A Fib, usually in NSR. Also with a hx of GERD. She has had R fem-pop bypass - at that time she was quite ill with decompensated cardiac fxn in the perioperative period. She is referred today for some exertional dyspnea. She has problems with L nasal obstruction since she had Bells Palsy years ago.     Past Medical History:  Diagnosis Date  . Anxiety   . Arthritis   . Atrial fibrillation (HCC)   . Bell's palsy    when pt. was 70 yrs old, when under stress the left side of face will droop.  [name]. GERD (gastroesophageal reflux disease)   . History of kidney stones   . Hypertension   . Hypertrophic cardiomyopathy (HCC)    severe LV basilar hypertrophy witn no evidence of significant outflow tract obstruction, EF 65-70%, mild LAE, mild TR, grade 1a diastolic dysfunction 05/15/10 (Dr. Donato SchultzMark Skains) (Atrial Septal Hypertrophy pattern)-- Intra-op TEE with dsignificant outflow tract obstruction - AI, MR & TR  . Insomnia   . Mild aortic sclerosis (HCC)   . Osteopenia   . Peripheral vascular disease (HCC)   . Syncope    , Vagal     Family History  Problem Relation Age of Onset  . Liver cancer Mother   . Cancer Mother     Liver  . Hypertension Mother   . Lung cancer Father   . Cancer Father     Lung  . Breast cancer Sister   . Cancer Sister     Breast     Social History   Social History  . Marital status: Married    Spouse name: N/A  . Number of children: N/A  . Years of education: N/A   Occupational History  . Not on file.   Social History Main Topics  . Smoking status: Former Smoker    Packs/day: 1.00    Years: 50.00    Types: Cigarettes    Quit date: 12/15/2014  . Smokeless tobacco: Never Used  . Alcohol use Yes     Comment: 1-2 per week  . Drug use:  No  . Sexual activity: Not on file   Other Topics Concern  . Not on file   Social History Narrative  . No narrative on file     Allergies  Allergen Reactions  . Amoxicillin Other (See Comments)    UTI  . Atenolol Cough  . Crestor [Rosuvastatin Calcium]     Muscle aches  . Pravastatin     Muscle aches  . Sulfa Antibiotics Nausea Only  . Codeine Rash     Outpatient Medications Prior to Visit  Medication Sig Dispense Refill  . apixaban (ELIQUIS) 5 MG TABS tablet Take 1 tablet (5 mg total) by mouth 2 (two) times daily. 60 tablet 11  . aspirin 81 MG tablet Take 81 mg by mouth daily.    [name]. buPROPion (WELLBUTRIN XL) 150 MG 24 hr tablet Take one by mouth in the morning daily  0  . Multiple Vitamin (MULTIVITAMIN WITH MINERALS) TABS Take 1 tablet by mouth at bedtime.    . Multiple Vitamins-Iron (STRESS B COMPLEX/IRON) TABS Take 1 tablet by mouth daily. Reported on 04/05/2015    . nebivolol (  BYSTOLIC) 10 MG tablet Take by mouth at bedtime. Takes 1/2 tablet (5 mg) once daily    . zolpidem (AMBIEN) 5 MG tablet 5 mg at bedtime as needed for sleep.      No facility-administered medications prior to visit.      Review of Systems  Constitutional: Negative for fever and unexpected weight change.  HENT: Negative for congestion, dental problem, ear pain, nosebleeds, postnasal drip, rhinorrhea, sinus pressure, sneezing, sore throat and trouble swallowing.   Eyes: Negative for redness and itching.  Respiratory: Positive for chest tightness and shortness of breath. Negative for cough and wheezing.   Cardiovascular: Negative for palpitations and leg swelling.  Gastrointestinal: Negative for nausea and vomiting.  Genitourinary: Negative for dysuria.  Musculoskeletal: Negative for joint swelling.  Skin: Negative for rash.  Neurological: Negative for headaches.  Hematological: Does not bruise/bleed easily.  Psychiatric/Behavioral: Negative for dysphoric mood. The patient is not nervous/anxious.         Objective:   Physical Exam Vitals:   01/26/16 1126 01/26/16 1128  BP:  136/80  Pulse:  (!) 50  SpO2:  97%  Weight: 134 lb (60.8 kg)   Height: 5\' 5"  (1.651 m)     Gen: Pleasant, well-nourished, in no distress,  normal affect  ENT: No lesions,  mouth clear,  oropharynx clear, no postnasal drip  Neck: No JVD, no TMG, no carotid bruits  Lungs: No use of accessory muscles, clear without rales or rhonchi  Cardiovascular: RRR, heart sounds normal, no murmur or gallops, no peripheral edema  Musculoskeletal: No deformities, no cyanosis or clubbing  Neuro: alert, non focal  Skin: Warm, no lesions or rashes      Assessment & Plan:  Tobacco abuse, in remission Congratulated her on her cessation. Discussed the fact that she should not restart.  Dyspnea Occurs with stairs and other significant exertion. Suspect that there is a component of COPD here given her tobacco history, chest x-ray. I would like to perform full pulmonary function testing to quantify her degree of obstruction. We can then discuss possible therapy. Walking  oximetry today. Follow-up to review her PFT when available.  Levy Pupa, MD, PhD 01/26/2016, 11:54 AM [facility] [phone] or if no answer [phone]

## 2016-01-26 NOTE — Assessment & Plan Note (Signed)
Occurs with stairs and other significant exertion. Suspect that there is a component of COPD here given her tobacco history, chest x-ray. I would like to perform full pulmonary function testing to quantify her degree of obstruction. We can then discuss possible therapy. Walking  oximetry today. Follow-up to review her PFT when available.

## 2016-01-26 NOTE — Patient Instructions (Addendum)
We will arrange for full pulmonary function testing  Follow with Dr Delton CoombesByrum next available to discuss the results.  Walking oximetry today.

## 2016-01-26 NOTE — Assessment & Plan Note (Signed)
Congratulated her on her cessation. Discussed the fact that she should not restart.

## 2016-02-20 ENCOUNTER — Encounter: Payer: Self-pay | Admitting: Vascular Surgery

## 2016-02-21 ENCOUNTER — Ambulatory Visit (HOSPITAL_COMMUNITY)
Admission: RE | Admit: 2016-02-21 | Discharge: 2016-02-21 | Disposition: A | Discharge status: Home / Self Care | Payer: Medicare Other | Source: Ambulatory Visit | Attending: Vascular Surgery | Admitting: Vascular Surgery

## 2016-02-21 ENCOUNTER — Ambulatory Visit (INDEPENDENT_AMBULATORY_CARE_PROVIDER_SITE_OTHER)
Admission: RE | Admit: 2016-02-21 | Discharge: 2016-02-21 | Disposition: A | Discharge status: Home / Self Care | Payer: Medicare Other | Source: Ambulatory Visit | Attending: Vascular Surgery | Admitting: Vascular Surgery

## 2016-02-21 ENCOUNTER — Encounter: Payer: Self-pay | Admitting: Vascular Surgery

## 2016-02-21 ENCOUNTER — Ambulatory Visit (INDEPENDENT_AMBULATORY_CARE_PROVIDER_SITE_OTHER): Payer: Medicare Other | Admitting: Vascular Surgery

## 2016-02-21 VITALS — BP 145/85 | HR 60 | Resp 16 | Ht 66.0 in | Wt 134.0 lb

## 2016-02-21 DIAGNOSIS — R0989 Other specified symptoms and signs involving the circulatory and respiratory systems: Secondary | ICD-10-CM | POA: Diagnosis present

## 2016-02-21 DIAGNOSIS — Z87891 Personal history of nicotine dependence: Secondary | ICD-10-CM | POA: Diagnosis not present

## 2016-02-21 DIAGNOSIS — I739 Peripheral vascular disease, unspecified: Secondary | ICD-10-CM | POA: Diagnosis not present

## 2016-02-21 DIAGNOSIS — I70209 Unspecified atherosclerosis of native arteries of extremities, unspecified extremity: Secondary | ICD-10-CM

## 2016-02-21 DIAGNOSIS — R938 Abnormal findings on diagnostic imaging of other specified body structures: Secondary | ICD-10-CM | POA: Diagnosis not present

## 2016-02-21 DIAGNOSIS — I10 Essential (primary) hypertension: Secondary | ICD-10-CM | POA: Diagnosis not present

## 2016-02-21 NOTE — Progress Notes (Signed)
History of Present Illness:  Patient is a 70 y.o. year old female who presents for follow up ABI's and by pass duplex s/p right fem to below knee popliteal by pass.  She has continued to exercise daily and is eating a healthy diet.  She states she has noticed that her toe nails are growing and that this is a good sign of her leg getting enough arterial blood flow.  No new health issues other than her intolerance to statin drugs.  She was having such sever hip pain bilaterally she could barely walk.  Once the statin drugs were stopped her hip pain went away.  She continues to take a daily aspirin, beta blocker and Eliquis.     Past Medical History:  Diagnosis Date  . Anxiety   . Arthritis   . Atrial fibrillation (HCC)   . Bell's palsy    when pt. was 70 yrs old, when under stress the left side of face will droop.  [name] GERD (gastroesophageal reflux disease)   . History of kidney stones   . Hypertension   . Hypertrophic cardiomyopathy (HCC)    severe LV basilar hypertrophy witn no evidence of significant outflow tract obstruction, EF 65-70%, mild LAE, mild TR, grade 1a diastolic dysfunction 05/15/10 (Dr. Donato Schultz) (Atrial Septal Hypertrophy pattern)-- Intra-op TEE with dsignificant outflow tract obstruction - AI, MR & TR  . Insomnia   . Mild aortic sclerosis (HCC)   . Osteopenia   . Peripheral vascular disease (HCC)   . Syncope    , Vagal    Past Surgical History:  Procedure Laterality Date  . BREAST ENHANCEMENT SURGERY Bilateral   . ENDARTERECTOMY FEMORAL Right 03/02/2015   Procedure: ENDARTERECTOMY RIGHT FEMORAL;  Surgeon: Chuck Hint, MD;  Location: [facility] OR;  Service: Vascular;  Laterality: Right;  . FACIAL COSMETIC SURGERY    . FEMORAL-POPLITEAL BYPASS GRAFT Right 03/02/2015   Procedure: BYPASS GRAFT FEMORAL-BELOW KNEE POPLITEAL ARTERY;  Surgeon: Chuck Hint, MD;  Location: [facility] OR;  Service: Vascular;  Laterality: Right;  . HERNIA REPAIR    . INGUINAL  HERNIA REPAIR Bilateral   . left ovarian cyst    . PERIPHERAL VASCULAR CATHETERIZATION N/A 01/16/2015   Procedure: Abdominal Aortogram;  Surgeon: Chuck Hint, MD;  [facility];  Service: Cardiovascular;  Laterality: N/A;  . TONSILLECTOMY      ROS:   General:  No weight loss, Fever, chills  HEENT: No recent headaches, no nasal bleeding, no visual changes, no sore throat  Neurologic: No dizziness, blackouts, seizures. No recent symptoms of stroke or mini- stroke. No recent episodes of slurred speech, or temporary blindness.  Cardiac: No recent episodes of chest pain/pressure, no shortness of breath at rest.  No shortness of breath with exertion.  Denies history of atrial fibrillation or irregular heartbeat  Vascular: No history of rest pain in feet.  No history of claudication.  No history of non-healing ulcer, No history of DVT   Pulmonary: No home oxygen, no productive cough, no hemoptysis,  No asthma or wheezing  Musculoskeletal:  [ ]  Arthritis, [ ]  Low back pain,  [ ]  Joint pain  Hematologic:No history of hypercoagulable state.  No history of easy bleeding.  No history of anemia  Gastrointestinal: No hematochezia or melena,  No gastroesophageal reflux, no trouble swallowing  Urinary: [ ]  chronic Kidney disease, [ ]  on HD - [ ]  MWF or [ ]  TTHS, [ ]  Burning with urination, [ ]   Frequent urination, [ ]  Difficulty urinating;   Skin: No rashes  Psychological: No history of anxiety,  No history of depression  Social History Social History  Substance Use Topics  . Smoking status: Former Smoker    Packs/day: 1.00    Years: 50.00    Types: Cigarettes    Quit date: 12/15/2014  . Smokeless tobacco: Never Used  . Alcohol use Yes     Comment: 1-2 per week    Family History Family History  Problem Relation Age of Onset  . Liver cancer Mother   . Cancer Mother     Liver  . Hypertension Mother   . Lung cancer Father   . Cancer Father     Lung  . Breast  cancer Sister   . Cancer Sister     Breast    Allergies  Allergies  Allergen Reactions  . Amoxicillin Other (See Comments)    UTI  . Atenolol Cough  . Crestor [Rosuvastatin Calcium]     Muscle aches  . Pravastatin     Muscle aches  . Sulfa Antibiotics Nausea Only  . Codeine Rash     Current Outpatient Prescriptions  Medication Sig Dispense Refill  . apixaban (ELIQUIS) 5 MG TABS tablet Take 1 tablet (5 mg total) by mouth 2 (two) times daily. 60 tablet 11  . aspirin 81 MG tablet Take 81 mg by mouth daily.    [name]. buPROPion (WELLBUTRIN XL) 150 MG 24 hr tablet Take one by mouth in the morning daily  0  . DEXILANT 30 MG capsule Take 1 capsule by mouth daily.    . Multiple Vitamin (MULTIVITAMIN WITH MINERALS) TABS Take 1 tablet by mouth at bedtime.    . Multiple Vitamins-Iron (STRESS B COMPLEX/IRON) TABS Take 1 tablet by mouth daily. Reported on 04/05/2015    . nebivolol (BYSTOLIC) 10 MG tablet Take by mouth at bedtime. Takes 1/2 tablet (5 mg) once daily    . TURMERIC PO Take by mouth.    . zolpidem (AMBIEN) 5 MG tablet 5 mg at bedtime as needed for sleep.      No current facility-administered medications for this visit.     Physical Examination  Vitals:   02/21/16 1523 02/21/16 1528  BP: 140/76 (!) 145/85  Pulse: (!) 54 60  Resp: 16   SpO2: 95%   Weight: 134 lb (60.8 kg)   Height: 5\' 6"  (1.676 m)     Body mass index is 21.63 kg/m.  General:  Alert and oriented, no acute distress HEENT: Normal Neck: No bruit or JVD Pulmonary: Clear to auscultation bilaterally Cardiac: Regular Rate and Rhythm without murmur Abdomen: Soft, non-tender, non-distended, no mass, no scars Skin: No rash, right leg incision well healed Extremity Pulses:  2+ radial, brachial, femoral, dorsalis pedis,  pulses bilaterally Musculoskeletal: No deformity or edema  Neurologic: Upper and lower extremity motor 5/5 and symmetric  DATA:  ABI's  Right Triphasic flow with TBI 0.75 Normal Left  Biphasic DP 0.75, TBI 0.48   ASSESSMENT:  PAD s/p Right fem to below knee popliteal by pass patent graft   PLAN:  She is following her diet and exercising daily.  She is very happy with her surgical out come.  She does not want to continue to try statin medication due to an intolerance.  She states her total cholesterol is < 200.  We recommend good eating habits with a plant and fruit based intake.   She will f/u in 6 months for repeat  ABI's and Right by pass arterial duplex.   Thomasena EdisOLLINS, Sandee Bernath MAUREEN PA-C [facility]  The patient was seen in conjunction with Dr. Edilia Boickson today.

## 2016-02-26 NOTE — Addendum Note (Signed)
Addended by: Melodye PedMANESS-HARRISON, Jaydynn Wolford C on: 02/26/2016 04:03 PM   Modules accepted: Orders

## 2016-03-29 ENCOUNTER — Ambulatory Visit: Payer: Medicare Other | Admitting: Emergency Medicine

## 2016-04-22 ENCOUNTER — Encounter: Payer: Self-pay | Admitting: *Deleted

## 2016-04-22 ENCOUNTER — Telehealth: Payer: Self-pay | Admitting: Cardiology

## 2016-04-22 ENCOUNTER — Encounter: Payer: Self-pay | Admitting: Cardiology

## 2016-04-22 ENCOUNTER — Ambulatory Visit (INDEPENDENT_AMBULATORY_CARE_PROVIDER_SITE_OTHER): Payer: Medicare Other | Admitting: Cardiology

## 2016-04-22 VITALS — BP 132/78 | HR 58 | Ht 64.5 in | Wt 132.8 lb

## 2016-04-22 DIAGNOSIS — I208 Other forms of angina pectoris: Secondary | ICD-10-CM

## 2016-04-22 DIAGNOSIS — Z789 Other specified health status: Secondary | ICD-10-CM | POA: Diagnosis not present

## 2016-04-22 DIAGNOSIS — Z79899 Other long term (current) drug therapy: Secondary | ICD-10-CM

## 2016-04-22 DIAGNOSIS — I739 Peripheral vascular disease, unspecified: Secondary | ICD-10-CM | POA: Diagnosis not present

## 2016-04-22 DIAGNOSIS — I480 Paroxysmal atrial fibrillation: Secondary | ICD-10-CM | POA: Diagnosis not present

## 2016-04-22 DIAGNOSIS — Z01818 Encounter for other preprocedural examination: Secondary | ICD-10-CM

## 2016-04-22 NOTE — Telephone Encounter (Signed)
New Message     Pt is having issues with AFIB , she stopped her medication , having problem with heartburn and muscle problems    Pravastatin >>with this medication she could not raise her arms and was having severe shoulder pain when taking it

## 2016-04-22 NOTE — Telephone Encounter (Signed)
Pt reports she was having muscle pain on Pravastatin and she stopped it.  She also reports having epidoses where she gets really hot, has nausea, discomfort/pain up from stomach and into her throat, she feels sick on her stomach and has diarrhea.  This can last from 1/2 hr to 3 hours and it causes her to feel like she is in At Fib.  She is unsure if this is from her heart or her stomach.  Due to recent s/s, even though she is not actively having them now, I added her onto the schedule for further evaluation today at 3 pm.

## 2016-04-22 NOTE — Patient Instructions (Addendum)
Medication Instructions:  The current medical regimen is effective;  continue present plan and medications.  Labwork: Please return for pre-cath lab work (CBC, BMP and PT)  Testing/Procedures: You are being referred to the Lipid Clinic for treatment of your cholesterol.  Your physician has requested that you have a cardiac catheterization. This has been scheduled for Thursday, 05/02/2016.  Cardiac catheterization is used to diagnose and/or treat various heart conditions. Doctors may recommend this procedure for a number of different reasons. The most common reason is to evaluate chest pain. Chest pain can be a symptom of coronary artery disease (CAD), and cardiac catheterization can show whether plaque is narrowing or blocking your heart's arteries. This procedure is also used to evaluate the valves, as well as measure the blood flow and oxygen levels in different parts of your heart. For further information please visit [email or web address]. Please follow instruction sheet, as given.  Follow-Up: Follow up about 2 weeks after your cath with Dr Anne FuSkains.  If you need a refill on your cardiac medications before your next appointment, please call your pharmacy.  Thank you for choosing [facility]!!

## 2016-04-22 NOTE — Progress Notes (Signed)
Cardiology Office Note   Date:  04/22/2016   ID:  Beverly Li Reynolds, DOB 02-25-1946, MRN [id number]  PCP:  Clayborn HeronVictoria R Rankins, MD  Cardiologist:   Donato SchultzMark Skains, MD / Herbie BaltimoreHarding      History of Present Illness: Beverly Li Schmader is a 71 y.o. female  with hypertrophic obstructive cardiomyopathy , recent severe exacerbation during femoropopliteal bypass resulting in intubation , phenylephrine , fluids and prolonged intubation here for follow-up.  She reports that her weight has been up since January and she feels as though this may be related to higher caloric intake. She has edema in one ankle periodically that resolves on its own. Her shortness of breath with activity seems to be progressively getting worse however. She feels like she is on too much medication.   Echocardiogram demonstrates 1.9 cm septal wall without any evidence of SAM  Or obstructive outflow tract gradient at rest.   Post hospital, she followed up with Dr. Herbie BaltimoreHarding who took care of her at the end of her hospitalization. His notes reviewed.   She had a short course of atrial fibrillation, placed on amiodarone short-term.   She's had some difficulty with Crestor intolerance, statin intolerance. Trying to get her shirt off, her shoulders would ache. Since stopping the Crestor. She feels better.  She is willing to try pravastatin once again. Admits that she is not very eager to take.   2014 started having "heart burn" episodes. Debilitating. Out walking dog. By time she would get back, sweat, diahpesis, up neck, nausea then diarrhea. Wiped out for a few hours. Does not feel it now with exercise. Felt 3 hours irreg hb afib kicked in. Afaid to go anywhere. The sweat comes over her hot. Pain.    Past Medical History:  Diagnosis Date  . Anxiety   . Arthritis   . Atrial fibrillation (HCC)   . Bell's palsy    when pt. was 71 yrs old, when under stress the left side of face will droop.  [name]. GERD (gastroesophageal reflux disease)     . History of kidney stones   . Hypertension   . Hypertrophic cardiomyopathy (HCC)    severe LV basilar hypertrophy witn no evidence of significant outflow tract obstruction, EF 65-70%, mild LAE, mild TR, grade 1a diastolic dysfunction 05/15/10 (Dr. Donato SchultzMark Skains) (Atrial Septal Hypertrophy pattern)-- Intra-op TEE with dsignificant outflow tract obstruction - AI, MR & TR  . Insomnia   . Mild aortic sclerosis (HCC)   . Osteopenia   . Peripheral vascular disease (HCC)   . Syncope    , Vagal    Past Surgical History:  Procedure Laterality Date  . BREAST ENHANCEMENT SURGERY Bilateral   . ENDARTERECTOMY FEMORAL Right 03/02/2015   Procedure: ENDARTERECTOMY RIGHT FEMORAL;  Surgeon: Chuck Hinthristopher S Dickson, MD;  [facility];  Service: Vascular;  Laterality: Right;  . FACIAL COSMETIC SURGERY    . FEMORAL-POPLITEAL BYPASS GRAFT Right 03/02/2015   Procedure: BYPASS GRAFT FEMORAL-BELOW KNEE POPLITEAL ARTERY;  Surgeon: Chuck Hinthristopher S Dickson, MD;  [facility];  Service: Vascular;  Laterality: Right;  . HERNIA REPAIR    . INGUINAL HERNIA REPAIR Bilateral   . left ovarian cyst    . PERIPHERAL VASCULAR CATHETERIZATION N/A 01/16/2015   Procedure: Abdominal Aortogram;  Surgeon: Chuck Hinthristopher S Dickson, MD;  [facility] CV LAB;  Service: Cardiovascular;  Laterality: N/A;  . TONSILLECTOMY       Current Outpatient Prescriptions  Medication Sig Dispense Refill  . apixaban (ELIQUIS)  5 MG TABS tablet Take 1 tablet (5 mg total) by mouth 2 (two) times daily. 60 tablet 11  . aspirin 81 MG tablet Take 81 mg by mouth daily.    [name] buPROPion (WELLBUTRIN XL) 150 MG 24 hr tablet Take one by mouth in the morning daily  0  . DEXILANT 30 MG capsule Take 1 capsule by mouth daily.    . Multiple Vitamin (MULTIVITAMIN WITH MINERALS) TABS Take 1 tablet by mouth at bedtime.    . Multiple Vitamins-Iron (STRESS Li COMPLEX/IRON) TABS Take 1 tablet by mouth daily. Reported on 04/05/2015    . nebivolol (BYSTOLIC) 10  MG tablet Take by mouth at bedtime. Takes 1/2 tablet (5 mg) once daily    . zolpidem (AMBIEN) 5 MG tablet 5 mg at bedtime as needed for sleep.      No current facility-administered medications for this visit.     Allergies:   Amoxicillin; Atenolol; Crestor [rosuvastatin calcium]; Pravastatin; Sulfa antibiotics; and Codeine    Social History:  The patient  reports that she quit smoking about 16 months ago. Her smoking use included Cigarettes. She has a 50.00 pack-year smoking history. She has never used smokeless tobacco. She reports that she drinks alcohol. She reports that she does not use drugs.   Family History:  The patient's family history includes Breast cancer in her sister; Cancer in her father, mother, and sister; Hypertension in her mother; Liver cancer in her mother; Lung cancer in her father.    ROS:  Please see the history of present illness.   Otherwise, review of systems are positive for none.   All other systems are reviewed and negative.    PHYSICAL EXAM: VS:  BP 132/78   Pulse (!) 58   Ht 5' 4.5" (1.638 m)   Wt 132 lb 12.8 oz (60.2 kg)   LMP  (LMP Unknown)   BMI 22.44 kg/m  , BMI Body mass index is 22.44 kg/m. GEN: Well nourished, well developed, in no acute distress  HEENT: normal  Neck: no JVD, carotid bruits, or masses Cardiac: RRR; 2-3/6 systolic murmurs over precordium, no rubs, or gallops,no edema  Respiratory:  clear to auscultation bilaterally, normal work of breathing GI: soft, nontender, nondistended, + BS MS: no deformity or atrophy  Skin: warm and dry, no rash, right leg wound healing well.  Neuro:  Strength and sensation are intact Psych: euthymic mood, full affect   EKG:  EKG ordered today 04/22/16-sinus bradycardia rate 58 with biatrial enlargement, T-wave inversion noted in 1, aVL, subtle downsloping noted in V6. Note hypertrophic cardiomyopathy. Prior, sinus brady with T-wave changes same as current. Personally viewed.   Recent Labs: No  results found for requested labs within last 8760 hours.    Lipid Panel No results found for: CHOL, TRIG, HDL, CHOLHDL, VLDL, LDLCALC, LDLDIRECT    Wt Readings from Last 3 Encounters:  04/22/16 132 lb 12.8 oz (60.2 kg)  02/21/16 134 lb (60.8 kg)  01/26/16 134 lb (60.8 kg)      Other studies Reviewed: Additional studies/ records that were reviewed today include:  Hospital records reviewed, echocardiogram reviewed. Review of the above records demonstrates:  As above   ASSESSMENT AND PLAN:  Anginal symptoms   - I'm concerned about her sensation in her chest radiating up her neck when walking her dog for instance. We will check cardiac catheterization. Risks and benefits explained including stroke, heart attack, death, renal impairment. Her right radial pulse is not very palpable, her left  radial pulse is bounding. May wish to go by the left radial pulse. Her nuclear stress test previously done was deemed a low risk but I'm concerned that she may have underlying coronary disease given the extensive peripheral vascular disease that she manifests. She may also have right subclavian stenosis given the decreased radial pulse.  She also describes vagal-like experiences including nausea, vomiting, pallor, diaphoresis with subsequent diarrhea, feeling wiped out for hours. She has been battling this for years. Continue to avoid dehydration. She knows to lay down immediately if she begins to feel this.   Hypertrophic obstructive cardiomyopathy/Dyspnea on exertion  -  Avoid dehydration  -  At baseline, outflow tract is not significant  -  Intraoperative. With crossclamping, vasodilatation,   Increased adrenergic drive ,Perfect storm to elucidate obstruction.She was intubated for several days postoperatively femoropopliteal and hemodynamically post significant troubles trying to extubate given her periodic outflow tract obstruction which was severe.  -  We will try to avoid all elective surgical  procedures in the future.  -  No high-risk symptoms such as ventricular tachycardia, early cardiac death in family members , unexplained syncope , septum less than 3 cm. Murmur has been present at baseline.   -  No change in Bystolic (decreased heart rate , decreased inotropic beneficial in her situation).  - Dyspnea-Prior extensive smoking history- PFTs/pulmonary evaluation, Referred to pulmonary previously. Chest x-ray PA and lateral from 09/19/12 was interpreted as underlying COPD.   - stopped amlodipine. This may be contributing to lower extremity edema as well.   Sinus bradycardia  -  Stopped amiodarone  -  Stable, no symptoms. Continue with Bystolic 5   Paroxysmal atrial fibrillation  -  Continuing with anticoagulation, Eliquis  -  No longer on amiodarone.  -  If symptoms arise/worsen, may need to resume antiarrhythmic therapy as HOCM patients do not tolerate atrial fibrillation well.  -  postoperative   Statin intolerance  -  Did not tolerate Crestor well. When raising her hands above her head her shoulders ached. Since she is stopped, she feels better. Given her peripheral vascular disease we would like her on statin therapy. She wonders if her hip pain or bursitis with statin related. However, she was holding her grandchild on that hip for quite some time.  -  Did not tolerate pravastatin 20 mg once a day either. Her legs bother her significant only.  - We will set her up with lipid clinic for further discussion, question PC SK 9 inhibitor.  Current medicines are reviewed at length with the patient today.  The patient does not have concerns regarding medicines.  The following changes have been made:   Encouraged pravastatin.  Labs/ tests ordered today include:   Orders Placed This Encounter  Procedures  . Basic metabolic panel  . CBC  . Protime-INR  . EKG 12-Lead    Disposition:   FU with Skains in 2-3 weeks post catheterization  Signed, Donato Schultz, MD  04/22/2016 4:42 PM     [facility] [address] [place], [place], [place]  [address] Phone: [phone]; Fax: [phone]

## 2016-04-30 ENCOUNTER — Other Ambulatory Visit: Payer: Self-pay | Admitting: Cardiology

## 2016-04-30 ENCOUNTER — Other Ambulatory Visit: Payer: Medicare Other | Admitting: *Deleted

## 2016-04-30 ENCOUNTER — Ambulatory Visit (INDEPENDENT_AMBULATORY_CARE_PROVIDER_SITE_OTHER): Payer: Medicare Other | Admitting: Pharmacist

## 2016-04-30 VITALS — BP 116/82 | HR 58 | Ht 65.0 in | Wt 133.0 lb

## 2016-04-30 DIAGNOSIS — Z01818 Encounter for other preprocedural examination: Secondary | ICD-10-CM

## 2016-04-30 DIAGNOSIS — E785 Hyperlipidemia, unspecified: Secondary | ICD-10-CM

## 2016-04-30 DIAGNOSIS — I208 Other forms of angina pectoris: Secondary | ICD-10-CM

## 2016-04-30 DIAGNOSIS — Z79899 Other long term (current) drug therapy: Secondary | ICD-10-CM

## 2016-04-30 DIAGNOSIS — I480 Paroxysmal atrial fibrillation: Secondary | ICD-10-CM

## 2016-04-30 MED ORDER — ROSUVASTATIN CALCIUM 5 MG PO TABS: ORAL_TABLET | ORAL | 11 refills | Status: DC

## 2016-04-30 NOTE — Progress Notes (Signed)
Patient ID: Beverly Li                 DOB: [birth year]                    MRN: [id number]     HPI: Beverly Li is a 71 y.o. female patient referred to lipid clinic by Dr Anne Fu. PMH is significant for PVD s/p fem-pop bypass surgery, hypertrophic obstructive cardiomyopathy, afib, HTN, and diastolic HF. Pt to undergo cardiac catheterization on 1/18 due to symptoms of chest pain and ? right subclavian stenosis given decreased radial pulse at last OV with Dr Anne Fu on 1/8. Pt has a history of statin intolerance and presents today for further management.  Drawing baseline lipid panel today, no previous records on file. Hasn't been checked by PCP Dr Luciana Axe since October 2016 - LDL 58 on Crestor 5mg  daily at that time. Pt reports previously taking Crestor 5mg  daily. She tolerated this for about a year before developing shoulder aches. She then tried pravastatin 20mg  daily. After a few months, she developed hip and leg aches that made it difficult to walk and go up the stairs.  Pt walks daily and eats lean meats and vegetables, avoids processed foods.  Current Medications: none Intolerances: Crestor 5mg  daily - shoulder aches, pravastatin 20mg  daily - hip pain Risk Factors: PVD, upcoming cardiac cath to assess for CAD LDL goal: 70mg /dL  Diet: Eats lean meat - chicken and fish, and vegetables. Avoids fast food - this gives her indigestion.  Exercise: Walks 20-25 minutes per day.  Family History: The patient's family history includes Breast cancer in her sister; Cancer in her father, mother, and sister; Hypertension in her mother; Liver cancer in her mother; Lung cancer in her father.   Social History: The patient  reports that she quit smoking about 16 months ago. Her smoking use included Cigarettes. She has a 50.00 pack-year smoking history. She has never used smokeless tobacco. She reports that she drinks alcohol. She reports that she does not use drugs.   Labs: 04/30/16: TC 208, TG 138,  HDL 67, LDL 113 (no therapy) 02/09/15: TC 148, HDL 75, TG 72, LDL 58 (potentially on Crestor 5mg  daily)   Past Medical History:  Diagnosis Date  . Anxiety   . Arthritis   . Atrial fibrillation (HCC)   . Bell's palsy    when pt. was 71 yrs old, when under stress the left side of face will droop.  [name] GERD (gastroesophageal reflux disease)   . History of kidney stones   . Hypertension   . Hypertrophic cardiomyopathy (HCC)    severe LV basilar hypertrophy witn no evidence of significant outflow tract obstruction, EF 65-70%, mild LAE, mild TR, grade 1a diastolic dysfunction 05/15/10 (Dr. Donato Schultz) (Atrial Septal Hypertrophy pattern)-- Intra-op TEE with dsignificant outflow tract obstruction - AI, MR & TR  . Insomnia   . Mild aortic sclerosis (HCC)   . Osteopenia   . Peripheral vascular disease (HCC)   . Syncope    , Vagal    Current Outpatient Prescriptions on File Prior to Visit  Medication Sig Dispense Refill  . apixaban (ELIQUIS) 5 MG TABS tablet Take 1 tablet (5 mg total) by mouth 2 (two) times daily. 60 tablet 11  . aspirin 81 MG tablet Take 81 mg by mouth daily.    [name] buPROPion (WELLBUTRIN XL) 150 MG 24 hr tablet Take 150 mg by mouth daily.    [name] DEXILANT 30 MG  capsule Take 1 capsule by mouth daily.    . Multiple Vitamins-Iron (STRESS B COMPLEX/IRON) TABS Take 1 tablet by mouth daily. Reported on 04/05/2015    . nebivolol (BYSTOLIC) 10 MG tablet Take 5 mg by mouth at bedtime. Takes 1/2 tablet (5 mg) once daily    . zolpidem (AMBIEN) 5 MG tablet Take 5 mg by mouth at bedtime as needed for sleep.      No current facility-administered medications on file prior to visit.     Allergies  Allergen Reactions  . Amoxicillin Other (See Comments)    UTI  . Atenolol Cough  . Crestor [Rosuvastatin Calcium]     Muscle aches  . Pravastatin     Muscle aches  . Sulfa Antibiotics Nausea Only  . Codeine Rash    Assessment/Plan:  1. Hyperlipidemia - LDL rechecked today shows baseline  of 113mg /dL above LDL goal < 70mg /dL given history of PVD. Pt prefers to rechallenge with Crestor at this time. Will start Crestor 5mg  3 days a week with plan to titrate up dose as tolerated. Will recheck lipids in 2 months with plan to start Zetia at that time if needed. Briefly discussed PCSK9i as an option in the future if statin + Zetia don't bring LDL to goal. Pt would like to reserve this as a last line option but states that cost would not be prohibitive.   Patient signed informed consent for [facility] lipid registry.   Jymir Dunaj E. Kallan Merrick, PharmD, CPP, BCACP [facility] [address], [place], [place] [address] Phone: [phone]; Fax: [phone] 04/30/2016 11:16 AM

## 2016-04-30 NOTE — Addendum Note (Signed)
Addended by: Tonita PhoenixBOWDEN, Ayman Brull K on: 04/30/2016 04:21 PM   Modules accepted: Orders

## 2016-04-30 NOTE — Patient Instructions (Addendum)
Start taking Crestor (rosuvastatin) 3 days a week as tolerated. You can increase your dose as tolerated depending on how you feel.  You can look for Coenzyme Q 10 to see if this helps with muscle aches - try taking 200 - 400mg  per day.  I'll call you with lipid panel results today - we may consider adding Zetia (ezetimibe) depending on how your labs look.  Recheck lipids in 2 months on Monday March 12th. Come in any time after 7:30am for fasting lipid work.   Call Megan in the lipid clinic with any questions #[phone].

## 2016-05-01 LAB — LIPID PANEL
CHOL/HDL RATIO: 3.1 ratio (ref 0.0–4.4)
Cholesterol, Total: 208 mg/dL — ABNORMAL HIGH (ref 100–199)
HDL: 67 mg/dL (ref >39)
LDL Calculated: 113 mg/dL — ABNORMAL HIGH (ref 0–99)
TRIGLYCERIDES: 138 mg/dL (ref 0–149)
VLDL Cholesterol Cal: 28 mg/dL (ref 5–40)

## 2016-05-01 LAB — BASIC METABOLIC PANEL
BUN/Creatinine Ratio: 43 — ABNORMAL HIGH (ref 12–28)
BUN: 26 mg/dL (ref 8–27)
CALCIUM: 9.4 mg/dL (ref 8.7–10.3)
CO2: 25 mmol/L (ref 18–29)
Chloride: 102 mmol/L (ref 96–106)
Creatinine, Ser: 0.61 mg/dL (ref 0.57–1.00)
GFR calc Af Amer: 105 mL/min/{1.73_m2} (ref >59)
GFR, EST NON AFRICAN AMERICAN: 92 mL/min/{1.73_m2} (ref >59)
Glucose: 94 mg/dL (ref 65–99)
POTASSIUM: 4.1 mmol/L (ref 3.5–5.2)
Sodium: 143 mmol/L (ref 134–144)

## 2016-05-01 LAB — CBC
HEMATOCRIT: 47.6 % — AB (ref 34.0–46.6)
Hemoglobin: 15.6 g/dL (ref 11.1–15.9)
MCH: 28.8 pg (ref 26.6–33.0)
MCHC: 32.8 g/dL (ref 31.5–35.7)
MCV: 88 fL (ref 79–97)
Platelets: 302 10*3/uL (ref 150–379)
RBC: 5.41 x10E6/uL — AB (ref 3.77–5.28)
RDW: 15.5 % — ABNORMAL HIGH (ref 12.3–15.4)
WBC: 9.3 10*3/uL (ref 3.4–10.8)

## 2016-05-01 LAB — PROTIME-INR
INR: 1 (ref 0.8–1.2)
PROTHROMBIN TIME: 10.3 s (ref 9.1–12.0)

## 2016-05-03 ENCOUNTER — Encounter: Payer: Self-pay | Admitting: *Deleted

## 2016-05-03 ENCOUNTER — Telehealth: Payer: Self-pay | Admitting: Cardiology

## 2016-05-03 DIAGNOSIS — Z006 Encounter for examination for normal comparison and control in clinical research program: Secondary | ICD-10-CM

## 2016-05-03 NOTE — Telephone Encounter (Signed)
PT AWARE OF LAB RESULTS./CY 

## 2016-05-03 NOTE — Telephone Encounter (Signed)
Follow Up:; ° ° °Returning your call. °

## 2016-05-03 NOTE — Progress Notes (Signed)
Late entry:  Subject met inclusion and exclusion criteria. The informed consent form, study requirements and expectations were reviewed with the subject and questions and concerns were addressed prior to the signing of the consent form. The subject verbalized understanding of the trail requirements. The subject agreed to participate in the [place] signed the informed consent. The informed consent was obtained prior to performance of any protocol-specific procedures for the subject. A copy of the signed informed consent was given to the subject and a copy was placed in the subject's medical record.  [name], RN 04/30/2016

## 2016-05-07 ENCOUNTER — Encounter (HOSPITAL_COMMUNITY): Payer: Self-pay | Admitting: *Deleted

## 2016-05-07 ENCOUNTER — Inpatient Hospital Stay (HOSPITAL_COMMUNITY)
Admission: AD | Admit: 2016-05-07 | Discharge: 2016-05-12 | DRG: 286 | Disposition: A | Discharge status: Home / Self Care | Payer: Medicare Other | Source: Ambulatory Visit | Attending: Internal Medicine | Admitting: Internal Medicine

## 2016-05-07 ENCOUNTER — Encounter (HOSPITAL_COMMUNITY)
Admission: AD | Disposition: A | Discharge status: Home / Self Care | Payer: Self-pay | Source: Ambulatory Visit | Attending: Internal Medicine

## 2016-05-07 ENCOUNTER — Inpatient Hospital Stay (HOSPITAL_COMMUNITY): Payer: Medicare Other

## 2016-05-07 DIAGNOSIS — Z87891 Personal history of nicotine dependence: Secondary | ICD-10-CM | POA: Diagnosis not present

## 2016-05-07 DIAGNOSIS — Z7901 Long term (current) use of anticoagulants: Secondary | ICD-10-CM

## 2016-05-07 DIAGNOSIS — J449 Chronic obstructive pulmonary disease, unspecified: Secondary | ICD-10-CM | POA: Diagnosis present

## 2016-05-07 DIAGNOSIS — N390 Urinary tract infection, site not specified: Secondary | ICD-10-CM | POA: Diagnosis present

## 2016-05-07 DIAGNOSIS — Z885 Allergy status to narcotic agent status: Secondary | ICD-10-CM

## 2016-05-07 DIAGNOSIS — I959 Hypotension, unspecified: Secondary | ICD-10-CM | POA: Diagnosis present

## 2016-05-07 DIAGNOSIS — Y92239 Unspecified place in hospital as the place of occurrence of the external cause: Secondary | ICD-10-CM | POA: Diagnosis not present

## 2016-05-07 DIAGNOSIS — M1990 Unspecified osteoarthritis, unspecified site: Secondary | ICD-10-CM | POA: Diagnosis present

## 2016-05-07 DIAGNOSIS — R0600 Dyspnea, unspecified: Secondary | ICD-10-CM

## 2016-05-07 DIAGNOSIS — G4700 Insomnia, unspecified: Secondary | ICD-10-CM | POA: Diagnosis present

## 2016-05-07 DIAGNOSIS — J811 Chronic pulmonary edema: Secondary | ICD-10-CM

## 2016-05-07 DIAGNOSIS — Z79899 Other long term (current) drug therapy: Secondary | ICD-10-CM | POA: Diagnosis not present

## 2016-05-07 DIAGNOSIS — J9601 Acute respiratory failure with hypoxia: Secondary | ICD-10-CM | POA: Diagnosis present

## 2016-05-07 DIAGNOSIS — Z881 Allergy status to other antibiotic agents status: Secondary | ICD-10-CM

## 2016-05-07 DIAGNOSIS — R1011 Right upper quadrant pain: Secondary | ICD-10-CM | POA: Diagnosis not present

## 2016-05-07 DIAGNOSIS — T465X5A Adverse effect of other antihypertensive drugs, initial encounter: Secondary | ICD-10-CM | POA: Diagnosis not present

## 2016-05-07 DIAGNOSIS — I480 Paroxysmal atrial fibrillation: Secondary | ICD-10-CM | POA: Diagnosis present

## 2016-05-07 DIAGNOSIS — I951 Orthostatic hypotension: Secondary | ICD-10-CM | POA: Diagnosis present

## 2016-05-07 DIAGNOSIS — R001 Bradycardia, unspecified: Secondary | ICD-10-CM | POA: Diagnosis present

## 2016-05-07 DIAGNOSIS — Z9981 Dependence on supplemental oxygen: Secondary | ICD-10-CM | POA: Diagnosis not present

## 2016-05-07 DIAGNOSIS — R55 Syncope and collapse: Secondary | ICD-10-CM

## 2016-05-07 DIAGNOSIS — M8580 Other specified disorders of bone density and structure, unspecified site: Secondary | ICD-10-CM | POA: Diagnosis present

## 2016-05-07 DIAGNOSIS — Z8249 Family history of ischemic heart disease and other diseases of the circulatory system: Secondary | ICD-10-CM

## 2016-05-07 DIAGNOSIS — I739 Peripheral vascular disease, unspecified: Secondary | ICD-10-CM | POA: Diagnosis present

## 2016-05-07 DIAGNOSIS — I5033 Acute on chronic diastolic (congestive) heart failure: Secondary | ICD-10-CM | POA: Diagnosis present

## 2016-05-07 DIAGNOSIS — Z7902 Long term (current) use of antithrombotics/antiplatelets: Secondary | ICD-10-CM | POA: Diagnosis not present

## 2016-05-07 DIAGNOSIS — I950 Idiopathic hypotension: Secondary | ICD-10-CM | POA: Diagnosis not present

## 2016-05-07 DIAGNOSIS — Z888 Allergy status to other drugs, medicaments and biological substances status: Secondary | ICD-10-CM | POA: Diagnosis not present

## 2016-05-07 DIAGNOSIS — I110 Hypertensive heart disease with heart failure: Secondary | ICD-10-CM | POA: Diagnosis present

## 2016-05-07 DIAGNOSIS — I2510 Atherosclerotic heart disease of native coronary artery without angina pectoris: Secondary | ICD-10-CM

## 2016-05-07 DIAGNOSIS — I421 Obstructive hypertrophic cardiomyopathy: Secondary | ICD-10-CM | POA: Diagnosis present

## 2016-05-07 DIAGNOSIS — R0602 Shortness of breath: Secondary | ICD-10-CM

## 2016-05-07 DIAGNOSIS — F419 Anxiety disorder, unspecified: Secondary | ICD-10-CM | POA: Diagnosis present

## 2016-05-07 DIAGNOSIS — Z882 Allergy status to sulfonamides status: Secondary | ICD-10-CM

## 2016-05-07 HISTORY — PX: CARDIAC CATHETERIZATION: SHX172

## 2016-05-07 LAB — POCT ACTIVATED CLOTTING TIME: Activated Clotting Time: 483 seconds

## 2016-05-07 LAB — MRSA PCR SCREENING: MRSA by PCR: NEGATIVE

## 2016-05-07 SURGERY — LEFT HEART CATH AND CORONARY ANGIOGRAPHY

## 2016-05-07 MED ORDER — ZOLPIDEM TARTRATE 5 MG PO TABS
5.0000 mg | ORAL_TABLET | Freq: Every evening | ORAL | Status: DC | PRN
Start: 2016-05-07 — End: 2016-05-12
  Administered 2016-05-07 – 2016-05-11 (×5): 5 mg via ORAL
  Filled 2016-05-07 (×5): qty 1

## 2016-05-07 MED ORDER — ORAL CARE MOUTH RINSE
15.0000 mL | Freq: Two times a day (BID) | OROMUCOSAL | Status: DC
  Administered 2016-05-07 – 2016-05-12 (×6): 15 mL via OROMUCOSAL

## 2016-05-07 MED ORDER — FENTANYL CITRATE (PF) 100 MCG/2ML IJ SOLN
INTRAMUSCULAR | Status: AC
  Filled 2016-05-07: qty 2

## 2016-05-07 MED ORDER — VERAPAMIL HCL 2.5 MG/ML IV SOLN
INTRAVENOUS | Status: AC
  Filled 2016-05-07: qty 2

## 2016-05-07 MED ORDER — ADENOSINE 12 MG/4ML IV SOLN
INTRAVENOUS | Status: AC
  Filled 2016-05-07: qty 4

## 2016-05-07 MED ORDER — ATROPINE SULFATE 1 MG/10ML IJ SOSY
PREFILLED_SYRINGE | INTRAMUSCULAR | Status: AC
  Filled 2016-05-07: qty 10

## 2016-05-07 MED ORDER — MIDAZOLAM HCL 2 MG/2ML IJ SOLN
INTRAMUSCULAR | Status: AC
  Filled 2016-05-07: qty 2

## 2016-05-07 MED ORDER — ADENOSINE 12 MG/4ML IV SOLN
INTRAVENOUS | Status: AC
  Filled 2016-05-07: qty 16

## 2016-05-07 MED ORDER — SODIUM CHLORIDE 0.9 % IV SOLN
0.0000 ug/min | INTRAVENOUS | Status: DC
  Administered 2016-05-07: 90 ug/min via INTRAVENOUS
  Administered 2016-05-07: 85 ug/min via INTRAVENOUS
  Administered 2016-05-08: 75 ug/min via INTRAVENOUS
  Administered 2016-05-08 – 2016-05-09 (×4): 60 ug/min via INTRAVENOUS
  Filled 2016-05-07 (×11): qty 2

## 2016-05-07 MED ORDER — SODIUM CHLORIDE 0.9 % WEIGHT BASED INFUSION
3.0000 mL/kg/h | INTRAVENOUS | Status: AC
  Administered 2016-05-07: 3 mL/kg/h via INTRAVENOUS

## 2016-05-07 MED ORDER — PANTOPRAZOLE SODIUM 40 MG PO TBEC
40.0000 mg | DELAYED_RELEASE_TABLET | Freq: Every day | ORAL | Status: DC
  Administered 2016-05-08 – 2016-05-12 (×5): 40 mg via ORAL
  Filled 2016-05-07 (×5): qty 1

## 2016-05-07 MED ORDER — IOPAMIDOL (ISOVUE-370) INJECTION 76%
INTRAVENOUS | Status: AC
  Filled 2016-05-07: qty 100

## 2016-05-07 MED ORDER — ADENOSINE (DIAGNOSTIC) 140MCG/KG/MIN: INTRAVENOUS | Status: DC | PRN

## 2016-05-07 MED ORDER — ASPIRIN EC 81 MG PO TBEC
81.0000 mg | DELAYED_RELEASE_TABLET | Freq: Every day | ORAL | Status: DC
  Administered 2016-05-08 – 2016-05-12 (×5): 81 mg via ORAL
  Filled 2016-05-07 (×5): qty 1

## 2016-05-07 MED ORDER — MIDODRINE HCL 5 MG PO TABS
5.0000 mg | ORAL_TABLET | Freq: Once | ORAL | Status: AC
  Administered 2016-05-07: 5 mg via ORAL
  Filled 2016-05-07: qty 1

## 2016-05-07 MED ORDER — FENTANYL CITRATE (PF) 100 MCG/2ML IJ SOLN
INTRAMUSCULAR | Status: DC | PRN
  Administered 2016-05-07: 50 ug via INTRAVENOUS

## 2016-05-07 MED ORDER — STRESS B COMPLEX/IRON PO TABS: 1.0000 | ORAL_TABLET | Freq: Every day | ORAL | Status: DC

## 2016-05-07 MED ORDER — HEPARIN SODIUM (PORCINE) 1000 UNIT/ML IJ SOLN
INTRAMUSCULAR | Status: AC
  Filled 2016-05-07: qty 1

## 2016-05-07 MED ORDER — NOREPINEPHRINE BITARTRATE 1 MG/ML IV SOLN
INTRAVENOUS | Status: AC
  Filled 2016-05-07: qty 4

## 2016-05-07 MED ORDER — BUPROPION HCL ER (XL) 150 MG PO TB24
150.0000 mg | ORAL_TABLET | Freq: Every day | ORAL | Status: DC
  Administered 2016-05-08 – 2016-05-12 (×5): 150 mg via ORAL
  Filled 2016-05-07 (×5): qty 1

## 2016-05-07 MED ORDER — SODIUM CHLORIDE 0.9% FLUSH: 3.0000 mL | Freq: Two times a day (BID) | INTRAVENOUS | Status: DC

## 2016-05-07 MED ORDER — ADENOSINE 6 MG/2ML IV SOLN
INTRAVENOUS | Status: AC
  Filled 2016-05-07: qty 2

## 2016-05-07 MED ORDER — SODIUM CHLORIDE 0.9 % IV SOLN
250.0000 mL | INTRAVENOUS | Status: DC | PRN
Start: 2016-05-07 — End: 2016-05-12

## 2016-05-07 MED ORDER — ASPIRIN 81 MG PO CHEW
CHEWABLE_TABLET | ORAL | Status: AC
  Administered 2016-05-07: 81 mg via ORAL
  Filled 2016-05-07: qty 1

## 2016-05-07 MED ORDER — HEPARIN (PORCINE) IN NACL 2-0.9 UNIT/ML-% IJ SOLN
INTRAMUSCULAR | Status: AC
  Filled 2016-05-07: qty 1000

## 2016-05-07 MED ORDER — SODIUM CHLORIDE 0.9% FLUSH: 3.0000 mL | INTRAVENOUS | Status: DC | PRN

## 2016-05-07 MED ORDER — SODIUM CHLORIDE 0.9 % WEIGHT BASED INFUSION: 1.0000 mL/kg/h | INTRAVENOUS | Status: DC

## 2016-05-07 MED ORDER — MIDODRINE HCL 5 MG PO TABS
5.0000 mg | ORAL_TABLET | Freq: Three times a day (TID) | ORAL | Status: DC
  Administered 2016-05-07: 5 mg via ORAL
  Filled 2016-05-07: qty 1

## 2016-05-07 MED ORDER — MIDODRINE HCL 5 MG PO TABS
10.0000 mg | ORAL_TABLET | Freq: Three times a day (TID) | ORAL | Status: DC
  Administered 2016-05-08 – 2016-05-12 (×14): 10 mg via ORAL
  Filled 2016-05-07 (×14): qty 2

## 2016-05-07 MED ORDER — VASOPRESSIN 20 UNIT/ML IV SOLN
0.0300 [IU]/min | INTRAVENOUS | Status: DC
  Filled 2016-05-07: qty 2

## 2016-05-07 MED ORDER — LIDOCAINE HCL (PF) 1 % IJ SOLN
INTRAMUSCULAR | Status: AC
  Filled 2016-05-07: qty 30

## 2016-05-07 MED ORDER — FUROSEMIDE 10 MG/ML IJ SOLN
INTRAMUSCULAR | Status: AC
  Filled 2016-05-07: qty 4

## 2016-05-07 MED ORDER — SODIUM CHLORIDE 0.9 % IV SOLN
0.0000 ug/min | INTRAVENOUS | Status: DC
  Filled 2016-05-07: qty 1

## 2016-05-07 MED ORDER — ROSUVASTATIN CALCIUM 10 MG PO TABS
5.0000 mg | ORAL_TABLET | Freq: Every day | ORAL | Status: DC
  Administered 2016-05-07 – 2016-05-11 (×4): 5 mg via ORAL
  Filled 2016-05-07 (×5): qty 1

## 2016-05-07 MED ORDER — IOPAMIDOL (ISOVUE-370) INJECTION 76%
INTRAVENOUS | Status: AC
Start: 2016-05-07 — End: 2016-05-07
  Filled 2016-05-07: qty 50

## 2016-05-07 MED ORDER — ONDANSETRON HCL 4 MG/2ML IJ SOLN: 4.0000 mg | Freq: Four times a day (QID) | INTRAMUSCULAR | Status: DC | PRN

## 2016-05-07 MED ORDER — HYDRALAZINE HCL 20 MG/ML IJ SOLN
INTRAMUSCULAR | Status: AC
  Administered 2016-05-07: 20 mg via INTRAVENOUS
  Filled 2016-05-07: qty 1

## 2016-05-07 MED ORDER — VERAPAMIL HCL 2.5 MG/ML IV SOLN
INTRAVENOUS | Status: DC | PRN
  Administered 2016-05-07: 10 mL via INTRA_ARTERIAL

## 2016-05-07 MED ORDER — ACETAMINOPHEN 325 MG PO TABS
650.0000 mg | ORAL_TABLET | ORAL | Status: DC | PRN
  Administered 2016-05-08 – 2016-05-09 (×2): 650 mg via ORAL
  Filled 2016-05-07 (×2): qty 2

## 2016-05-07 MED ORDER — HYDRALAZINE HCL 20 MG/ML IJ SOLN
20.0000 mg | Freq: Once | INTRAMUSCULAR | Status: AC
  Administered 2016-05-07: 20 mg via INTRAVENOUS

## 2016-05-07 MED ORDER — ASPIRIN 81 MG PO CHEW
81.0000 mg | CHEWABLE_TABLET | ORAL | Status: AC
  Administered 2016-05-07: 81 mg via ORAL

## 2016-05-07 MED ORDER — HEPARIN (PORCINE) IN NACL 2-0.9 UNIT/ML-% IJ SOLN
INTRAMUSCULAR | Status: DC | PRN
Start: 2016-05-07 — End: 2016-05-07
  Administered 2016-05-07: 1000 mL

## 2016-05-07 MED ORDER — SODIUM CHLORIDE 0.9 % IV SOLN: 250.0000 mL | INTRAVENOUS | Status: DC | PRN

## 2016-05-07 MED ORDER — IOPAMIDOL (ISOVUE-370) INJECTION 76%
INTRAVENOUS | Status: DC | PRN
  Administered 2016-05-07: 120 mL via INTRA_ARTERIAL

## 2016-05-07 MED ORDER — SODIUM CHLORIDE 0.9 % IV SOLN: INTRAVENOUS | Status: AC

## 2016-05-07 MED ORDER — LIDOCAINE HCL (PF) 1 % IJ SOLN
INTRAMUSCULAR | Status: DC | PRN
  Administered 2016-05-07: 2 mL

## 2016-05-07 MED ORDER — ONDANSETRON HCL 4 MG/2ML IJ SOLN
4.0000 mg | Freq: Four times a day (QID) | INTRAMUSCULAR | Status: DC | PRN
  Administered 2016-05-07 – 2016-05-09 (×4): 4 mg via INTRAVENOUS
  Filled 2016-05-07 (×4): qty 2

## 2016-05-07 MED ORDER — SODIUM CHLORIDE 0.9% FLUSH
3.0000 mL | Freq: Two times a day (BID) | INTRAVENOUS | Status: DC
  Administered 2016-05-07 (×2): 10 mL via INTRAVENOUS
  Administered 2016-05-08 – 2016-05-11 (×7): 3 mL via INTRAVENOUS

## 2016-05-07 MED ORDER — MIDAZOLAM HCL 2 MG/2ML IJ SOLN
INTRAMUSCULAR | Status: DC | PRN
  Administered 2016-05-07: 2 mg via INTRAVENOUS

## 2016-05-07 MED ORDER — SODIUM CHLORIDE 0.9% FLUSH
3.0000 mL | Freq: Two times a day (BID) | INTRAVENOUS | Status: DC
  Administered 2016-05-07 (×2): 10 mL via INTRAVENOUS
  Administered 2016-05-10 – 2016-05-11 (×4): 3 mL via INTRAVENOUS

## 2016-05-07 MED ORDER — ADENOSINE 6 MG/2ML IV SOLN
INTRAVENOUS | Status: AC
Start: 2016-05-07 — End: 2016-05-07
  Filled 2016-05-07: qty 4

## 2016-05-07 MED ORDER — HEPARIN SODIUM (PORCINE) 1000 UNIT/ML IJ SOLN
INTRAMUSCULAR | Status: DC | PRN
  Administered 2016-05-07: 5000 [IU] via INTRAVENOUS
  Administered 2016-05-07: 3500 [IU] via INTRAVENOUS

## 2016-05-07 MED ORDER — APIXABAN 5 MG PO TABS
5.0000 mg | ORAL_TABLET | Freq: Two times a day (BID) | ORAL | Status: DC
  Administered 2016-05-07 – 2016-05-12 (×10): 5 mg via ORAL
  Filled 2016-05-07 (×10): qty 1

## 2016-05-07 MED FILL — Medication: Qty: 1 | Status: AC

## 2016-05-07 SURGICAL SUPPLY — 12 items
CATH 5FR JL3.5 JR4 ANG PIG MP (CATHETERS) ×2 IMPLANT
CATH VISTA GUIDE 6FR XBLAD3.5 (CATHETERS) ×2 IMPLANT
DEVICE RAD COMP TR BAND LRG (VASCULAR PRODUCTS) ×4 IMPLANT
GLIDESHEATH SLEND SS 6F .021 (SHEATH) ×2 IMPLANT
GUIDEWIRE INQWIRE 1.5J.035X260 (WIRE) IMPLANT
GUIDEWIRE PRESSURE COMET II (WIRE) ×2 IMPLANT
INQWIRE 1.5J .035X260CM (WIRE) ×3
KIT ESSENTIALS PG (KITS) ×2 IMPLANT
KIT HEART LEFT (KITS) ×3 IMPLANT
PACK CARDIAC CATHETERIZATION (CUSTOM PROCEDURE TRAY) ×3 IMPLANT
TRANSDUCER W/STOPCOCK (MISCELLANEOUS) ×3 IMPLANT
TUBING CIL FLEX 10 FLL-RA (TUBING) ×3 IMPLANT

## 2016-05-07 NOTE — H&P (Signed)
CARDIOLOGY H&P  Reason for admission: Profound hypotension post cardiac cath   HPI:  Beverly Li is a 71 y.o. female  with hypertrophic obstructive cardiomyopathy, recurrent vagal episodes, PAD, anxiety.  Underwent fem-pop bypass in 11/16 and admission c/b recurrent hypotension and respiratory failure resulting in intubation , phenylephrine , fluids and prolonged intubation. Vent wen limited by recurrent vagal episodes.   Underwent outpatient cardiac cath today for recurrent CP. Cath uncomplicated with non-obstructive CAD. Post cath had some bleeding at radial access site and severe HTN with SBP > 190. Given 20mg  IV hydralazine. Patient became profoundly hypotensive and bradycardic likely due to concurrent vagal episode.   Dr. Clifton James responded quickly and patient started on fluids.SBP dropped progressively to the 40s. HR into low 50s. I responded as well. Patient placed in supine position and started IVF wide open. Anesthesia and RRT summoned and given two 400 mcg neosynephirne boluses and started on neo drip titrated to 80 mcg/min. BP then recovered and patient developed respiratory distress due to pulmonary edema. Given low-dose lasix and moved to ICU.     Review of Systems:     Cardiac Review of Systems: {Y] = yes [ ]  = no  Chest Pain [ y   ]  Resting SOB [place].Beverly Li   ] Exertional SOB  [  ]  Orthopnea y[  ]   Pedal Edema [   ]    Palpitations [  ] Syncope  [  ]   Presyncope [   ]  General Review of Systems: [Y] = yes [  ]=no Constitional: recent weight change [  ]; anorexia [  ]; fatigue [  ]; nausea [  ]; night sweats [  ]; fever [  ]; or chills [  ];                                                                     Dental: poor dentition[  ];   Eye : blurred vision [  ]; diplopia [   ]; vision changes [  ];  Amaurosis fugax[  ]; Resp: cough [  ];  wheezing[  ];  hemoptysis[  ]; shortness of breath[  y]; paroxysmal nocturnal dyspnea[  ]; dyspnea on exertion[ y ]; or orthopnea[y  ];    GI:  gallstones[  ], vomiting[  ];  dysphagia[  ]; melena[  ];  hematochezia [  ]; heartburn[  ];   GU: kidney stones [  ]; hematuria[  ];   dysuria [  ];  nocturia[  ];               Skin: rash [  ], swelling[  ];, hair loss[  ];  peripheral edema[  ];  or itching[  ]; Musculosketetal: myalgias[  ];  joint swelling[  ];  joint erythema[  ];  joint pain[  y];  back pain[  ];  Heme/Lymph: bruising[  ];  bleeding[  ];  anemia[  ];  Neuro: TIA[  ];  headaches[  ];  stroke[  ];  vertigo[  ];  seizures[  ];   paresthesias[  ];  difficulty walking[  ];  Psych:depression[  ]; Beverly Li  ];  Endocrine: diabetes[  ];  thyroid dysfunction[  ];  Other:  Past Medical History:  Diagnosis Date  . Anxiety   . Arthritis   . Atrial fibrillation (HCC)   . Bell's palsy    when pt. was 71 yrs old, when under stress the left side of face will droop.  [name]. GERD (gastroesophageal reflux disease)   . History of kidney stones   . Hypertension   . Hypertrophic cardiomyopathy (HCC)    severe LV basilar hypertrophy witn no evidence of significant outflow tract obstruction, EF 65-70%, mild LAE, mild TR, grade 1a diastolic dysfunction 05/15/10 (Dr. Donato SchultzMark Skains) (Atrial Septal Hypertrophy pattern)-- Intra-op TEE with dsignificant outflow tract obstruction - AI, MR & TR  . Insomnia   . Mild aortic sclerosis (HCC)   . Osteopenia   . Peripheral vascular disease (HCC)   . Syncope    , Vagal    Prior to Admission medications   Medication Sig Start Date End Date Taking? Authorizing Provider  apixaban (ELIQUIS) 5 MG TABS tablet Take 1 tablet (5 mg total) by mouth 2 (two) times daily. 11/27/15  Yes Jake BatheMark C Skains, MD  aspirin 81 MG tablet Take 81 mg by mouth daily.   Yes Historical Provider, MD  buPROPion (WELLBUTRIN XL) 150 MG 24 hr tablet Take 150 mg by mouth daily.   Yes Historical Provider, MD  DEXILANT 30 MG capsule Take 1 capsule by mouth daily. 01/17/16  Yes Historical Provider, MD  Multiple Vitamins-Iron (STRESS B  COMPLEX/IRON) TABS Take 1 tablet by mouth daily. Reported on 04/05/2015   Yes Historical Provider, MD  nebivolol (BYSTOLIC) 10 MG tablet Take 5 mg by mouth at bedtime. Takes 1/2 tablet (5 mg) once daily   Yes Historical Provider, MD  rosuvastatin (CRESTOR) 5 MG tablet Take 1 tablet by mouth most days a week as tolerated 04/30/16  Yes Jake BatheMark C Skains, MD  zolpidem (AMBIEN) 5 MG tablet Take 5 mg by mouth at bedtime as needed for sleep.  07/04/15  Yes Historical Provider, MD      Allergies  Allergen Reactions  . Amoxicillin Other (See Comments)    UTI  . Atenolol Cough  . Crestor [Rosuvastatin Calcium]     Muscle aches  . Pravastatin     Muscle aches  . Sulfa Antibiotics Nausea Only  . Codeine Rash    Social History   Social History  . Marital status: Married    Spouse name: N/A  . Number of children: N/A  . Years of education: N/A   Occupational History  . Not on file.   Social History Main Topics  . Smoking status: Former Smoker    Packs/day: 1.00    Years: 50.00    Types: Cigarettes    Quit date: 12/15/2014  . Smokeless tobacco: Never Used  . Alcohol use Yes     Comment: 1-2 per week  . Drug use: No  . Sexual activity: Not on file   Other Topics Concern  . Not on file   Social History Narrative  . No narrative on file    Family History  Problem Relation Age of Onset  . Liver cancer Mother   . Cancer Mother     Liver  . Hypertension Mother   . Lung cancer Father   . Cancer Father     Lung  . Breast cancer Sister   . Cancer Sister     Breast    PHYSICAL EXAM: Vitals:   05/07/16 1530 05/07/16 1641  BP: 127/69   Pulse: (!) 49  Resp: 12   Temp:  97.6 F (36.4 C)   General:  Weak pale appearing. dyspneic HEENT: normal Neck: supple. JVP 9. Carotids 2+ bilat; no bruits. No lymphadenopathy or thryomegaly appreciated. Cor: PMI nondisplaced. Regular rate & rhythm. No rubs, gallops or murmurs. Lungs: basilar crackles  Abdomen: soft, nontender,  nondistended. No hepatosplenomegaly. No bruits or masses. Good bowel sounds. Extremities: no cyanosis, clubbing, rash, edema Neuro: alert & oriented x 3, cranial nerves grossly intact. moves all 4 extremities w/o difficulty. Affect pleasant.  ECG: NSR  Results for orders placed or performed during the hospital encounter of 05/07/16 (from the past 24 hour(s))  POCT Activated clotting time     Status: None   Collection Time: 05/07/16  9:48 AM  Result Value Ref Range   Activated Clotting Time 483 seconds  MRSA PCR Screening     Status: None   Collection Time: 05/07/16  2:41 PM  Result Value Ref Range   MRSA by PCR NEGATIVE NEGATIVE   Dg Chest Port 1 View  Result Date: 05/07/2016 CLINICAL DATA:  Shortness of breath, acute onset. Assess for pulmonary edema. Initial encounter. EXAM: PORTABLE CHEST 1 VIEW COMPARISON:  Chest radiograph performed 03/29/2015 FINDINGS: Mild vascular congestion is noted. Mild bibasilar airspace opacities likely reflect pulmonary edema, given clinical concern. No pleural effusion or pneumothorax is seen. The cardiomediastinal silhouette is borderline normal in size. No acute osseous abnormalities are identified. IMPRESSION: Mild vascular congestion noted. Mild bibasilar airspace opacities likely reflect pulmonary edema, given clinical concern, though pneumonia could conceivably have a similar appearance. Electronically Signed   By: Roanna Raider M.D.   On: 05/07/2016 16:15     ASSESSMENT: 1. Profound hypotension 2. Acute respiratory failure 3. Vaso-vagal syndrome 4. Hypertrophic CM  5. PAD 6. Non-obstructive CAD  PLAN/DISCUSSION:  BP improved but still requiring neosynephrine support. We have admitted her to ICU. Will start midodrine as well. She has received low-dose lasix but she is still dyspneic with mild CHF on CXR. Can repeat diuresis as needed but need to be careful with pre-load. On echo she has septal wall thickening but not resting obstruction. Thus I  think the biggest issue here is profound vagal response to emotional and psychological stress. Will start midodrine. I will d/w Dr. Graciela Husbands regarding other strategies including possible SSRI therapy or pacing.   The patient is critically ill with multiple organ systems failure and requires high complexity decision making for assessment and support, frequent evaluation and titration of therapies, application of advanced monitoring technologies and extensive interpretation of multiple databases.   Critical Care Time devoted to patient care services described in this note is 45 Minutes.   Bensimhon, Daniel,MD 6:09 PM

## 2016-05-07 NOTE — Care Management Note (Signed)
Case Management Note  Patient Details  Name: Beverly Li MRN: [id number] Date of Birth: 1946-01-12  Subjective/Objective:        Hypertrophic obstructive cardiomyopathy/Dyspnea on exertion, hypotension           Action/Plan: Discharge Planning: NCM spoke to pt and husband at bedside. Pt states she was independent prior to hospital stay. She was taking Eliquis prior to hospital stay. Pt may need oxygen for home. Will need walking oxygen sat.   PCP-Victoria Rankin MD    Expected Discharge Date:                 Expected Discharge Plan:  Home/Self Care  In-House Referral:  NA  Discharge planning Services  CM Consult  Post Acute Care Choice:  NA Choice offered to:  NA  DME Arranged:  N/A DME Agency:  NA  HH Arranged:  NA HH Agency:  NA  Status of Service:  Completed, signed off  If discussed at Long Length of Stay Meetings, dates discussed:    Additional Comments:  Beverly Li, Beverly Hoffert Ellen, RN 05/07/2016, 4:13 PM

## 2016-05-07 NOTE — Progress Notes (Signed)
Dr Jacklynn BueMassagee in and called rapid response; 400mcg of neosynephrine iv given by Dr Jacklynn BueMassagee

## 2016-05-07 NOTE — Progress Notes (Signed)
Client transferred via stretcher with cardiac monitor showing sinus rhythm;neosynephrine off; O2 via non-rebreather

## 2016-05-07 NOTE — Significant Event (Signed)
Rapid Response Event Note  Overview: Time Called: 1201 Arrival Time: 1205 Event Type: Hypotension  Initial Focused Assessment: Patient with sudden, sever symptomatic hypotension post cath. 42/30 SB 45  RR 18  O2 sat 93% Patient complains of Chest pressure. Lung sounds clear,  Patient slightly dusky Diaphoretic, cool  Interventions: Atropine given IV  CRNA also giving push of neo total of 800mcg neo given. Placed on 100% NRB HR improve to 70s post atropine BP initially unresponsive to Neo administration Neo gtt started at 30 mcg and increased to 80 mcg After about 20 min patient's bp improved to 158/74. Neo gtt weaned and d/c'd Patient started to complain of difficulty breathing.  Lung sounds with crackles in bases  20 mg lasix given IV, Patient incontinent of large amount of urine. Patient began to feel better, less SOB.    Transferred to 4N23 via stretcher wit O2 and heart monitor.  Dr Gala RomneyBensimhon at bedside through out event and transfer   Plan of Care (if not transferred):  Event Summary: Name of Physician Notified: Dr Milas KocherBensimon at bedsie at      at    Outcome: Transferred (Comment)  Event End Time: 1315  Marcellina MillinLayton, Sharelle Burditt

## 2016-05-07 NOTE — Progress Notes (Signed)
Neosynephrine drip increased to 80mcg and 80mcg neosynephrine given [facility]CR[facility]; client states c/p is decreased

## 2016-05-07 NOTE — Progress Notes (Signed)
Neosynephrine drip decreased to 

## 2016-05-07 NOTE — Progress Notes (Signed)
Resp therapist in and client placed on non-rebreather mask

## 2016-05-07 NOTE — Progress Notes (Signed)
Client's wife called me to room and client states has full feeling in throat and feels nauseated; paged Dr Clifton JamesMcAlhany and Basilia JumboBrittiany,PA

## 2016-05-07 NOTE — Progress Notes (Signed)
Neosynephrine drip increased to 60mcg and 160mcg [facility]osynephrine [facility]

## 2016-05-07 NOTE — Interval H&P Note (Signed)
History and Physical Interval Note:  05/07/2016 8:57 AM  Beverly Li  has presented today for cardiac cath with the diagnosis of unstable angina. The various methods of treatment have been discussed with the patient and family. After consideration of risks, benefits and other options for treatment, the patient has consented to  Procedure(s): Left Heart Cath and Coronary Angiography (N/A) as a surgical intervention .  The patient's history has been reviewed, patient examined, no change in status, stable for surgery.  I have reviewed the patient's chart and labs.  Questions were answered to the patient's satisfaction.    Cath Lab Visit (complete for each Cath Lab visit)  Clinical Evaluation Leading to the Procedure:   ACS: No.  Non-ACS:    Anginal Classification: CCS II  Anti-ischemic medical therapy: Minimal Therapy (1 class of medications)  Non-Invasive Test Results: No non-invasive testing performed  Prior CABG: No previous CABG         Verne Carrowhristopher Lavine Hargrove

## 2016-05-07 NOTE — Progress Notes (Signed)
Dr Clifton JamesMcAlhany in and client's B/P down to 55/27; IV fluids increased to 999 for 250cc

## 2016-05-07 NOTE — Progress Notes (Signed)
Neosynephrine 80mcg [facility]

## 2016-05-07 NOTE — Progress Notes (Addendum)
Dr Gala RomneyBensimhon in to see client and per Dr Gala RomneyBensimhon 2nd iv increased to 999cc/hr and Council MechanicHelle RN with rapid response in; atropine 0.4mg  given IV  by Eli[facility]om Mueller,CRNA

## 2016-05-07 NOTE — Progress Notes (Signed)
80mcg [facility]osynephrine [facility]

## 2016-05-07 NOTE — Progress Notes (Signed)
Client c/o chest pain

## 2016-05-07 NOTE — Progress Notes (Signed)
Rapid response note (late entry)  Responded to Rapid response team call- upon entering room multiple MD's, anesthesia  and RN's already in room.  Noted pt sat 86-88% on 4 lpm [place], pt color appeared dusky. Pt placed on NRB immediately, sat slowly improved to 93-94% on NRB, stayed w/ pt until transfer to ICU d/t pt c/o SOB, bbsh w/ crackles- RN gave lasix. Once back in ICU, VS improving and SOB improving.  RN and RT aware.

## 2016-05-07 NOTE — Progress Notes (Addendum)
Client's color pale and client very diaphoretic and placed in trendelenburg and started O2 at 2l/min via nasal cannula

## 2016-05-07 NOTE — Progress Notes (Signed)
Pt desat to 88% on 6 lpm [place], pt placed on 10 lpm HFNC, sat slowly improving to 90%.  While I was in room, pt began to vomit, I immediately turned pt to side, called for help and oral suctioned.  Pt vomited 3-4 times while I was in room.  Pt states she does not feel that she inhaled any vomit.  No distress noted.  Pt states she "feels better" after vomiting.  RN aware and in room w/ pt currently.

## 2016-05-07 NOTE — Progress Notes (Signed)
IV fluids decreased to 10cc/hr client transferred from recliner to stretcher; c/o shortness of breath; lasix 20mg  iv per Dr Gala RomneyBensimhon; neosynephrine drip decreased to 30mcg

## 2016-05-07 NOTE — Progress Notes (Signed)
Neosynephrine drip started at 

## 2016-05-07 NOTE — Progress Notes (Signed)
Report given to RN in CCU.

## 2016-05-07 NOTE — H&P (View-Only) (Signed)
Cardiology Office Note   Date:  04/22/2016   ID:  Beverly Li, DOB 02-25-1946, MRN [id number]  PCP:  Clayborn HeronVictoria R Rankins, MD  Cardiologist:   Donato SchultzMark Skains, MD / Herbie BaltimoreHarding      History of Present Illness: Beverly Li is a 71 y.o. female  with hypertrophic obstructive cardiomyopathy , recent severe exacerbation during femoropopliteal bypass resulting in intubation , phenylephrine , fluids and prolonged intubation here for follow-up.  She reports that her weight has been up since January and she feels as though this may be related to higher caloric intake. She has edema in one ankle periodically that resolves on its own. Her shortness of breath with activity seems to be progressively getting worse however. She feels like she is on too much medication.   Echocardiogram demonstrates 1.9 cm septal wall without any evidence of SAM  Or obstructive outflow tract gradient at rest.   Post hospital, she followed up with Dr. Herbie BaltimoreHarding who took care of her at the end of her hospitalization. His notes reviewed.   She had a short course of atrial fibrillation, placed on amiodarone short-term.   She's had some difficulty with Crestor intolerance, statin intolerance. Trying to get her shirt off, her shoulders would ache. Since stopping the Crestor. She feels better.  She is willing to try pravastatin once again. Admits that she is not very eager to take.   2014 started having "heart burn" episodes. Debilitating. Out walking dog. By time she would get back, sweat, diahpesis, up neck, nausea then diarrhea. Wiped out for a few hours. Does not feel it now with exercise. Felt 3 hours irreg hb afib kicked in. Afaid to go anywhere. The sweat comes over her hot. Pain.    Past Medical History:  Diagnosis Date  . Anxiety   . Arthritis   . Atrial fibrillation (HCC)   . Bell's palsy    when pt. was 71 yrs old, when under stress the left side of face will droop.  [name]. GERD (gastroesophageal reflux disease)     . History of kidney stones   . Hypertension   . Hypertrophic cardiomyopathy (HCC)    severe LV basilar hypertrophy witn no evidence of significant outflow tract obstruction, EF 65-70%, mild LAE, mild TR, grade 1a diastolic dysfunction 05/15/10 (Dr. Donato SchultzMark Skains) (Atrial Septal Hypertrophy pattern)-- Intra-op TEE with dsignificant outflow tract obstruction - AI, MR & TR  . Insomnia   . Mild aortic sclerosis (HCC)   . Osteopenia   . Peripheral vascular disease (HCC)   . Syncope    , Vagal    Past Surgical History:  Procedure Laterality Date  . BREAST ENHANCEMENT SURGERY Bilateral   . ENDARTERECTOMY FEMORAL Right 03/02/2015   Procedure: ENDARTERECTOMY RIGHT FEMORAL;  Surgeon: Chuck Hinthristopher S Dickson, MD;  [facility];  Service: Vascular;  Laterality: Right;  . FACIAL COSMETIC SURGERY    . FEMORAL-POPLITEAL BYPASS GRAFT Right 03/02/2015   Procedure: BYPASS GRAFT FEMORAL-BELOW KNEE POPLITEAL ARTERY;  Surgeon: Chuck Hinthristopher S Dickson, MD;  [facility];  Service: Vascular;  Laterality: Right;  . HERNIA REPAIR    . INGUINAL HERNIA REPAIR Bilateral   . left ovarian cyst    . PERIPHERAL VASCULAR CATHETERIZATION N/A 01/16/2015   Procedure: Abdominal Aortogram;  Surgeon: Chuck Hinthristopher S Dickson, MD;  [facility];  Service: Cardiovascular;  Laterality: N/A;  . TONSILLECTOMY       Current Outpatient Prescriptions  Medication Sig Dispense Refill  . apixaban (ELIQUIS)  5 MG TABS tablet Take 1 tablet (5 mg total) by mouth 2 (two) times daily. 60 tablet 11  . aspirin 81 MG tablet Take 81 mg by mouth daily.    [name] buPROPion (WELLBUTRIN XL) 150 MG 24 hr tablet Take one by mouth in the morning daily  0  . DEXILANT 30 MG capsule Take 1 capsule by mouth daily.    . Multiple Vitamin (MULTIVITAMIN WITH MINERALS) TABS Take 1 tablet by mouth at bedtime.    . Multiple Vitamins-Iron (STRESS B COMPLEX/IRON) TABS Take 1 tablet by mouth daily. Reported on 04/05/2015    . nebivolol (BYSTOLIC) 10  MG tablet Take by mouth at bedtime. Takes 1/2 tablet (5 mg) once daily    . zolpidem (AMBIEN) 5 MG tablet 5 mg at bedtime as needed for sleep.      No current facility-administered medications for this visit.     Allergies:   Amoxicillin; Atenolol; Crestor [rosuvastatin calcium]; Pravastatin; Sulfa antibiotics; and Codeine    Social History:  The patient  reports that she quit smoking about 16 months ago. Her smoking use included Cigarettes. She has a 50.00 pack-year smoking history. She has never used smokeless tobacco. She reports that she drinks alcohol. She reports that she does not use drugs.   Family History:  The patient's family history includes Breast cancer in her sister; Cancer in her father, mother, and sister; Hypertension in her mother; Liver cancer in her mother; Lung cancer in her father.    ROS:  Please see the history of present illness.   Otherwise, review of systems are positive for none.   All other systems are reviewed and negative.    PHYSICAL EXAM: VS:  BP 132/78   Pulse (!) 58   Ht 5' 4.5" (1.638 m)   Wt 132 lb 12.8 oz (60.2 kg)   LMP  (LMP Unknown)   BMI 22.44 kg/m  , BMI Body mass index is 22.44 kg/m. GEN: Well nourished, well developed, in no acute distress  HEENT: normal  Neck: no JVD, carotid bruits, or masses Cardiac: RRR; 2-3/6 systolic murmurs over precordium, no rubs, or gallops,no edema  Respiratory:  clear to auscultation bilaterally, normal work of breathing GI: soft, nontender, nondistended, + BS MS: no deformity or atrophy  Skin: warm and dry, no rash, right leg wound healing well.  Neuro:  Strength and sensation are intact Psych: euthymic mood, full affect   EKG:  EKG ordered today 04/22/16-sinus bradycardia rate 58 with biatrial enlargement, T-wave inversion noted in 1, aVL, subtle downsloping noted in V6. Note hypertrophic cardiomyopathy. Prior, sinus brady with T-wave changes same as current. Personally viewed.   Recent Labs: No  results found for requested labs within last 8760 hours.    Lipid Panel No results found for: CHOL, TRIG, HDL, CHOLHDL, VLDL, LDLCALC, LDLDIRECT    Wt Readings from Last 3 Encounters:  04/22/16 132 lb 12.8 oz (60.2 kg)  02/21/16 134 lb (60.8 kg)  01/26/16 134 lb (60.8 kg)      Other studies Reviewed: Additional studies/ records that were reviewed today include:  Hospital records reviewed, echocardiogram reviewed. Review of the above records demonstrates:  As above   ASSESSMENT AND PLAN:  Anginal symptoms   - I'm concerned about her sensation in her chest radiating up her neck when walking her dog for instance. We will check cardiac catheterization. Risks and benefits explained including stroke, heart attack, death, renal impairment. Her right radial pulse is not very palpable, her left  radial pulse is bounding. May wish to go by the left radial pulse. Her nuclear stress test previously done was deemed a low risk but I'm concerned that she may have underlying coronary disease given the extensive peripheral vascular disease that she manifests. She may also have right subclavian stenosis given the decreased radial pulse.  She also describes vagal-like experiences including nausea, vomiting, pallor, diaphoresis with subsequent diarrhea, feeling wiped out for hours. She has been battling this for years. Continue to avoid dehydration. She knows to lay down immediately if she begins to feel this.   Hypertrophic obstructive cardiomyopathy/Dyspnea on exertion  -  Avoid dehydration  -  At baseline, outflow tract is not significant  -  Intraoperative. With crossclamping, vasodilatation,   Increased adrenergic drive ,Perfect storm to elucidate obstruction.She was intubated for several days postoperatively femoropopliteal and hemodynamically post significant troubles trying to extubate given her periodic outflow tract obstruction which was severe.  -  We will try to avoid all elective surgical  procedures in the future.  -  No high-risk symptoms such as ventricular tachycardia, early cardiac death in family members , unexplained syncope , septum less than 3 cm. Murmur has been present at baseline.   -  No change in Bystolic (decreased heart rate , decreased inotropic beneficial in her situation).  - Dyspnea-Prior extensive smoking history- PFTs/pulmonary evaluation, Referred to pulmonary previously. Chest x-ray PA and lateral from 09/19/12 was interpreted as underlying COPD.   - stopped amlodipine. This may be contributing to lower extremity edema as well.   Sinus bradycardia  -  Stopped amiodarone  -  Stable, no symptoms. Continue with Bystolic 5   Paroxysmal atrial fibrillation  -  Continuing with anticoagulation, Eliquis  -  No longer on amiodarone.  -  If symptoms arise/worsen, may need to resume antiarrhythmic therapy as HOCM patients do not tolerate atrial fibrillation well.  -  postoperative   Statin intolerance  -  Did not tolerate Crestor well. When raising her hands above her head her shoulders ached. Since she is stopped, she feels better. Given her peripheral vascular disease we would like her on statin therapy. She wonders if her hip pain or bursitis with statin related. However, she was holding her grandchild on that hip for quite some time.  -  Did not tolerate pravastatin 20 mg once a day either. Her legs bother her significant only.  - We will set her up with lipid clinic for further discussion, question PC SK 9 inhibitor.  Current medicines are reviewed at length with the patient today.  The patient does not have concerns regarding medicines.  The following changes have been made:   Encouraged pravastatin.  Labs/ tests ordered today include:   Orders Placed This Encounter  Procedures  . Basic metabolic panel  . CBC  . Protime-INR  . EKG 12-Lead    Disposition:   FU with Skains in 2-3 weeks post catheterization  Signed, Donato Schultz, MD  04/22/2016 4:42 PM     [facility] [address] [place], [place], [place]  [address] Phone: [phone]; Fax: [phone]

## 2016-05-08 ENCOUNTER — Inpatient Hospital Stay (HOSPITAL_COMMUNITY): Payer: Medicare Other

## 2016-05-08 ENCOUNTER — Encounter: Payer: Self-pay | Admitting: Cardiology

## 2016-05-08 ENCOUNTER — Encounter (HOSPITAL_COMMUNITY): Payer: Self-pay | Admitting: Cardiovascular Disease

## 2016-05-08 DIAGNOSIS — I421 Obstructive hypertrophic cardiomyopathy: Secondary | ICD-10-CM

## 2016-05-08 LAB — CBC WITH DIFFERENTIAL/PLATELET
Basophils Absolute: 0 10*3/uL (ref 0.0–0.1)
Basophils Relative: 0 %
EOS ABS: 0 10*3/uL (ref 0.0–0.7)
Eosinophils Relative: 0 %
HCT: 46.6 % — ABNORMAL HIGH (ref 36.0–46.0)
HEMOGLOBIN: 14.9 g/dL (ref 12.0–15.0)
LYMPHS ABS: 3.2 10*3/uL (ref 0.7–4.0)
LYMPHS PCT: 18 %
MCH: 28.9 pg (ref 26.0–34.0)
MCHC: 32 g/dL (ref 30.0–36.0)
MCV: 90.3 fL (ref 78.0–100.0)
MONOS PCT: 6 %
Monocytes Absolute: 1 10*3/uL (ref 0.1–1.0)
NEUTROS PCT: 76 %
Neutro Abs: 13.5 10*3/uL — ABNORMAL HIGH (ref 1.7–7.7)
Platelets: 290 10*3/uL (ref 150–400)
RBC: 5.16 MIL/uL — ABNORMAL HIGH (ref 3.87–5.11)
RDW: 15.6 % — ABNORMAL HIGH (ref 11.5–15.5)
WBC: 17.7 10*3/uL — ABNORMAL HIGH (ref 4.0–10.5)

## 2016-05-08 LAB — CBC
HCT: 47.3 % — ABNORMAL HIGH (ref 36.0–46.0)
HEMOGLOBIN: 15.2 g/dL — AB (ref 12.0–15.0)
MCH: 29.3 pg (ref 26.0–34.0)
MCHC: 32.1 g/dL (ref 30.0–36.0)
MCV: 91.3 fL (ref 78.0–100.0)
Platelets: 245 10*3/uL (ref 150–400)
RBC: 5.18 MIL/uL — ABNORMAL HIGH (ref 3.87–5.11)
RDW: 15.4 % (ref 11.5–15.5)
WBC: 16.1 10*3/uL — ABNORMAL HIGH (ref 4.0–10.5)

## 2016-05-08 LAB — URINALYSIS, ROUTINE W REFLEX MICROSCOPIC
Bilirubin Urine: NEGATIVE
Glucose, UA: NEGATIVE mg/dL
KETONES UR: 5 mg/dL — AB
Leukocytes, UA: NEGATIVE
Nitrite: POSITIVE — AB
Protein, ur: NEGATIVE mg/dL
Specific Gravity, Urine: 1.017 (ref 1.005–1.030)
pH: 5 (ref 5.0–8.0)

## 2016-05-08 LAB — HEPATIC FUNCTION PANEL
ALK PHOS: 107 U/L (ref 38–126)
ALT: 19 U/L (ref 14–54)
AST: 39 U/L (ref 15–41)
Albumin: 3.9 g/dL (ref 3.5–5.0)
BILIRUBIN INDIRECT: 0.8 mg/dL (ref 0.3–0.9)
BILIRUBIN TOTAL: 1 mg/dL (ref 0.3–1.2)
Bilirubin, Direct: 0.2 mg/dL (ref 0.1–0.5)
TOTAL PROTEIN: 6.2 g/dL — AB (ref 6.5–8.1)

## 2016-05-08 LAB — ECHOCARDIOGRAM COMPLETE
Height: 65 in
WEIGHTICAEL: 2100.8 [oz_av]

## 2016-05-08 LAB — BASIC METABOLIC PANEL
Anion gap: 7 (ref 5–15)
BUN: 30 mg/dL — AB (ref 6–20)
CHLORIDE: 110 mmol/L (ref 101–111)
CO2: 25 mmol/L (ref 22–32)
CREATININE: 0.8 mg/dL (ref 0.44–1.00)
Calcium: 9.1 mg/dL (ref 8.9–10.3)
GFR calc non Af Amer: >60 mL/min (ref >60)
Glucose, Bld: 82 mg/dL (ref 65–99)
Potassium: 4.2 mmol/L (ref 3.5–5.1)
Sodium: 142 mmol/L (ref 135–145)

## 2016-05-08 LAB — MAGNESIUM: MAGNESIUM: 2 mg/dL (ref 1.7–2.4)

## 2016-05-08 LAB — D-DIMER, QUANTITATIVE (NOT AT ARMC): D DIMER QUANT: 2.72 ug{FEU}/mL — AB (ref 0.00–0.50)

## 2016-05-08 MED ORDER — DEXTROSE 5 % IV SOLN
1.0000 g | INTRAVENOUS | Status: DC
  Administered 2016-05-08 – 2016-05-11 (×4): 1 g via INTRAVENOUS
  Filled 2016-05-08 (×5): qty 10

## 2016-05-08 MED ORDER — FUROSEMIDE 10 MG/ML IJ SOLN
20.0000 mg | Freq: Once | INTRAMUSCULAR | Status: AC
  Administered 2016-05-08: 20 mg via INTRAVENOUS
  Filled 2016-05-08: qty 2

## 2016-05-08 MED ORDER — LORATADINE 10 MG PO TABS
10.0000 mg | ORAL_TABLET | Freq: Every day | ORAL | Status: DC
  Administered 2016-05-08 – 2016-05-12 (×5): 10 mg via ORAL
  Filled 2016-05-08 (×5): qty 1

## 2016-05-08 MED FILL — Norepinephrine Bitartrate IV Soln 1 MG/ML (Base Equivalent): INTRAVENOUS | Qty: 4 | Status: AC

## 2016-05-08 MED FILL — Adenosine IV Soln 12 MG/4ML: INTRAVENOUS | Qty: 8 | Status: AC

## 2016-05-08 NOTE — Progress Notes (Signed)
Patient sat up in bed and requested the bedpan.  Patient told RN she wasn't feeling well and had a pain in her stomach.  These signs and symptoms are consistent with her normal vasovagal spells.  Patient was extremely anxious.  3 RNS assisted her to dangle at the bedside.  Within about 45 min, patient seemed to be feeling more comfortable.  RN back to bed and patients states she's feeling better.

## 2016-05-08 NOTE — Progress Notes (Signed)
  Echocardiogram 2D Echocardiogram has been performed.  Beverly Li, Beverly Li 05/08/2016, 2:02 PM

## 2016-05-08 NOTE — Progress Notes (Signed)
Advanced Heart Failure Rounding Note   Subjective:   Admitted post LHC with hypertension-->recevied hydralazine that resulted in hypotension and bradycardia. Also had acute respiratory distress and required NRB. Received IV fluids and started on neo. Later received IV lasix. Started on midodrine for vasovagal episodes.   Over night she had episodes of increased dyspnea. +Orthopnea + PND. Remains on neo at 80 mcg. On 14 liters oxygen. Also had RUQ pain with sharp pain that spontaneously resolved.  Today she is SOB at rest.   [facility] 05/06/2016   Mid RCA lesion, 20 %stenosed.  2nd Mrg lesion, 20 %stenosed.  Prox Cx to Mid Cx lesion, 30 %stenosed.  Mid LAD lesion, 60 %stenosed.      Objective:   Weight Range:  Vital Signs:   Temp:  [97.6 F (36.4 C)-98.3 F (36.8 C)] 97.8 F (36.6 C) (01/24 0635) Pulse Rate:  [45-91] 72 (01/24 0700) Resp:  [7-31] 17 (01/24 0700) BP: (42-194)/(25-115) 140/76 (01/24 0700) SpO2:  [84 %-100 %] 92 % (01/24 0700) FiO2 (%):  [55 %] 55 % (01/23 1800) Weight:  [130 lb (59 kg)-131 lb 4.8 oz (59.6 kg)] 131 lb 4.8 oz (59.6 kg) (01/24 0500)    Weight change: Filed Weights   05/07/16 0704 05/08/16 0500  Weight: 130 lb (59 kg) 131 lb 4.8 oz (59.6 kg)    Intake/Output:   Intake/Output Summary (Last 24 hours) at 05/08/16 0703 Last data filed at 05/08/16 0700  Gross per 24 hour  Intake          1079.61 ml  Output             1025 ml  Net            54.61 ml     Physical Exam: General:  Dyspneic at rest. Sitting up in bed.  HEENT: normal Neck: supple. JVP hard to assess due to increased WOB. . Carotids 2+ bilat; no bruits. No lymphadenopathy or thryomegaly appreciated. Cor: PMI nondisplaced. Regular rate & rhythm. No rubs, gallops or murmurs. Lungs: RLL LLL crackles. On 14 liters.  Abdomen: soft, nontender, nondistended. No hepatosplenomegaly. No bruits or masses. Good bowel sounds. Extremities: no cyanosis, clubbing, rash, edema Neuro:  alert & orientedx3, cranial nerves grossly intact. moves all 4 extremities w/o difficulty. Affect pleasant  Telemetry: NSR 80s   Labs: Basic Metabolic Panel:  Recent Labs Lab 05/08/16 0241  NA 142  K 4.2  CL 110  CO2 25  GLUCOSE 82  BUN 30*  CREATININE 0.80  CALCIUM 9.1    Liver Function Tests: No results for input(s): AST, ALT, ALKPHOS, BILITOT, PROT, ALBUMIN in the last 168 hours. No results for input(s): LIPASE, AMYLASE in the last 168 hours. No results for input(s): AMMONIA in the last 168 hours.  CBC:  Recent Labs Lab 05/08/16 0241  WBC 17.7*  NEUTROABS 13.5*  HGB 14.9  HCT 46.6*  MCV 90.3  PLT 290    Cardiac Enzymes: No results for input(s): CKTOTAL, CKMB, CKMBINDEX, TROPONINI in the last 168 hours.  BNP: BNP (last 3 results) No results for input(s): BNP in the last 8760 hours.  ProBNP (last 3 results) No results for input(s): PROBNP in the last 8760 hours.    Other results:  Imaging: Dg Chest Port 1 View  Result Date: 05/07/2016 CLINICAL DATA:  Shortness of breath, acute onset. Assess for pulmonary edema. Initial encounter. EXAM: PORTABLE CHEST 1 VIEW COMPARISON:  Chest radiograph performed 03/29/2015 FINDINGS: Mild vascular congestion is noted. Mild  bibasilar airspace opacities likely reflect pulmonary edema, given clinical concern. No pleural effusion or pneumothorax is seen. The cardiomediastinal silhouette is borderline normal in size. No acute osseous abnormalities are identified. IMPRESSION: Mild vascular congestion noted. Mild bibasilar airspace opacities likely reflect pulmonary edema, given clinical concern, though pneumonia could conceivably have a similar appearance. Electronically Signed   By: Roanna RaiderJeffery  Chang M.D.   On: 05/07/2016 16:15      Medications:     Scheduled Medications: . apixaban  5 mg Oral BID  . aspirin EC  81 mg Oral Daily  . buPROPion  150 mg Oral Daily  . mouth rinse  15 mL Mouth Rinse BID  . midodrine  10 mg Oral  TID WC  . pantoprazole  40 mg Oral Daily  . rosuvastatin  5 mg Oral q1800  . sodium chloride flush  3 mL Intravenous Q12H  . sodium chloride flush  3 mL Intravenous Q12H     Infusions: . phenylephrine 20mg /27250mL NS (0.08mg /ml) infusion 75 mcg/min (05/08/16 0409)  . vasopressin (PITRESSIN) infusion - *FOR SHOCK*       PRN Medications:  sodium chloride, sodium chloride, acetaminophen, ondansetron (ZOFRAN) IV, sodium chloride flush, sodium chloride flush, zolpidem   Assessment:   1. Profound hypotension 2. Acute respiratory failure 3. Vaso-vagal syndrome 4. Hypertrophic CM  5. A/C Diastolic Heart Failure 6. PAD 7.  Non-obstructive CAD  8. RUQ pain 9.Elevated WBC  10. PAF- on elliquis.      Plan/Discussion:    Increased WOB over night. Remains on 14 liters of oxygen. WBC up to 17 and having intermittent RUQ pain. CXR now, abd US, UA, blood cx, and ECHO. Check d dimer.  [facility] and restarted 1/[facility].   Try to wean neo slowly. BP better this morning. Continue midodrine 10   Give 20 mg IV lasix now. I am hesitant to give higher dose due to vasovagal episodes. Renal function stable.   Length of Stay: 1   Amy Clegg NP_C  05/08/2016, 7:03 AM  Advanced Heart Failure Team Pager [phone] (M-F; 7a - 4p)  Please contact [facility] for night-coverage after hours (4p -7a ) and weekends on [email or web address]  Patient seen and examined with Tonye BecketAmy Clegg, NP. We discussed all aspects of the encounter. I agree with the assessment and plan as stated above.   She remains very tenuous requiring high-flow O2 and high-dose neosynephrine and midodrine to maintain BP. Will diurese gently. Wean neo as tolerated.   She has complex physiology with vasovagal syndrome on top of HOCM. Echo reviewed personally and EF is hyperdynamic. Proximal septum is 2.2cm with LVOT gradient 23mmHG at rest. She s preload dependent and will be careful not to overdiurese.  UA + for UTI. Start  ceftriaxone. Cultures drawn.   Continue Eliquis for PAF.   D/w her and her husband at bedside. Critical care time 35 mins.    Bensimhon, Daniel,MD 8:58 PM

## 2016-05-08 NOTE — Progress Notes (Signed)
Pt had 26 beat of wide complex rhythm change. Asymptomatic, sitting in bed, NOT correlating with a vasovagal episode. Lisabeth DevoidMeng, PA paged and ordered Mag level.

## 2016-05-09 ENCOUNTER — Inpatient Hospital Stay (HOSPITAL_COMMUNITY): Payer: Medicare Other

## 2016-05-09 LAB — CBC
HCT: 45.5 % (ref 36.0–46.0)
HEMOGLOBIN: 14.5 g/dL (ref 12.0–15.0)
MCH: 29.2 pg (ref 26.0–34.0)
MCHC: 31.9 g/dL (ref 30.0–36.0)
MCV: 91.5 fL (ref 78.0–100.0)
PLATELETS: 220 10*3/uL (ref 150–400)
RBC: 4.97 MIL/uL (ref 3.87–5.11)
RDW: 15.3 % (ref 11.5–15.5)
WBC: 17.2 10*3/uL — ABNORMAL HIGH (ref 4.0–10.5)

## 2016-05-09 LAB — BASIC METABOLIC PANEL
Anion gap: 7 (ref 5–15)
BUN: 32 mg/dL — AB (ref 6–20)
CO2: 28 mmol/L (ref 22–32)
CREATININE: 0.83 mg/dL (ref 0.44–1.00)
Calcium: 9.2 mg/dL (ref 8.9–10.3)
Chloride: 106 mmol/L (ref 101–111)
GFR calc Af Amer: >60 mL/min (ref >60)
Glucose, Bld: 161 mg/dL — ABNORMAL HIGH (ref 65–99)
POTASSIUM: 3.7 mmol/L (ref 3.5–5.1)
SODIUM: 141 mmol/L (ref 135–145)

## 2016-05-09 LAB — MAGNESIUM: MAGNESIUM: 1.9 mg/dL (ref 1.7–2.4)

## 2016-05-09 MED ORDER — TRAMADOL HCL 50 MG PO TABS
50.0000 mg | ORAL_TABLET | Freq: Four times a day (QID) | ORAL | Status: DC | PRN
  Administered 2016-05-09: 50 mg via ORAL
  Filled 2016-05-09: qty 1

## 2016-05-09 MED ORDER — LORAZEPAM 2 MG/ML IJ SOLN
0.2500 mg | Freq: Once | INTRAMUSCULAR | Status: AC
  Administered 2016-05-09: 0.25 mg via INTRAVENOUS
  Filled 2016-05-09: qty 1

## 2016-05-09 MED ORDER — CLONAZEPAM 0.5 MG PO TABS
0.2500 mg | ORAL_TABLET | Freq: Two times a day (BID) | ORAL | Status: DC
  Administered 2016-05-09 – 2016-05-12 (×6): 0.25 mg via ORAL
  Filled 2016-05-09 (×6): qty 1

## 2016-05-09 MED ORDER — FUROSEMIDE 10 MG/ML IJ SOLN
20.0000 mg | Freq: Once | INTRAMUSCULAR | Status: AC
  Administered 2016-05-09: 20 mg via INTRAVENOUS
  Filled 2016-05-09: qty 2

## 2016-05-09 NOTE — Progress Notes (Signed)
Spoke with Dr. Antoine PocheHochrein regarding patient episode.   Patient became extremely restless, diaphoretic, audible inspiratory/expiratory wheezing, severe lower back spasms radiating to right side.  Patient states that this has been occurring intermittently for the past year, worsening over the past 6 months. Patient states that pain is difficult to describe, but that it effects her more at night. Obtained order for one time dose of Ativan.  Monitoring continues.

## 2016-05-09 NOTE — Progress Notes (Signed)
Advanced Heart Failure Rounding Note   Subjective:   Admitted post LHC with hypertension-->recevied hydralazine that resulted in hypotension and bradycardia. Also had acute respiratory distress and required NRB. Received IV fluids and started on neo. Later received IV lasix. Started on midodrine for vasovagal episodes.   Yesterday diuresed with 20 mg IV lasix. Weight down 5 pounds. UA with UTI started on antibiotics. Over night she had episodes of low back pain. Multiple episodes desaturations and acute dyspnea. Dr Antoine Poche evaluated with no further recommendations. Received .25 mg  Ativan. Remains on Neo 55-60 mcg.   This morning she denies SOB/CP. Denies low back pain.   UA- UTI- started on ceftriaxone.  Urine CX- pending Blood CX- pending [place]- gall bladder sludge. No aneurysm. No hydronephrosis ECHO- EF 65--70%.  LVOT gradient is 23 mmHg. Systolic function was   vigorous. The estimated ejection fraction was in the range of 65%   to 70%. Wall motion was normal; there were no regional wall   motion abnormalities. Doppler parameters are consistent with   abnormal left ventricular relaxation (grade 1 diastolic   dysfunction). The E/e&' ratio is >25, suggesting markedly elevated   LV Filling pressure.   LHC 05/06/2016   Mid RCA lesion, 20 %stenosed.  2nd Mrg lesion, 20 %stenosed.  Prox Cx to Mid Cx lesion, 30 %stenosed.  Mid LAD lesion, 60 %stenosed.     Objective:   Weight Range:  Vital Signs:   Temp:  [97 F (36.1 C)-98.1 F (36.7 C)] 97.8 F (36.6 C) (01/25 0442) Pulse Rate:  [47-80] 73 (01/25 0500) Resp:  [12-30] 21 (01/25 0500) BP: (76-164)/(36-118) 84/61 (01/25 0500) SpO2:  [90 %-100 %] 96 % (01/25 0500) FiO2 (%):  [50 %] 50 % (01/24 1630) Weight:  [126 lb 8.7 oz (57.4 kg)] 126 lb 8.7 oz (57.4 kg) (01/25 0442) Last BM Date: 05/07/16  Weight change: Filed Weights   05/07/16 0704 05/08/16 0500 05/09/16 0442  Weight: 130 lb (59 kg) 131 lb 4.8 oz (59.6 kg) 126  lb 8.7 oz (57.4 kg)    Intake/Output:   Intake/Output Summary (Last 24 hours) at 05/09/16 0655 Last data filed at 05/09/16 0415  Gross per 24 hour  Intake           1411.5 ml  Output             1025 ml  Net            386.5 ml     Physical Exam: General: NAD.  HEENT: normal Neck: supple. JVP hard to assess.  Carotids 2+ bilat; no bruits. No lymphadenopathy or thryomegaly appreciated. Cor: PMI nondisplaced. Regular rate & rhythm. 2/6 SEM at RSB Lungs: clear On 14 liters.  Abdomen: soft, nontender, nondistended. No hepatosplenomegaly. No bruits or masses. Good bowel sounds. Extremities: no cyanosis, clubbing, rash, edema Neuro: alert & orientedx3, cranial nerves grossly intact. moves all 4 extremities w/o difficulty. Affect pleasant  Telemetry: NSR 80s   Labs: Basic Metabolic Panel:  Recent Labs Lab 05/08/16 0241 05/08/16 1830  NA 142  --   K 4.2  --   CL 110  --   CO2 25  --   GLUCOSE 82  --   BUN 30*  --   CREATININE 0.80  --   CALCIUM 9.1  --   MG  --  2.0    Liver Function Tests:  Recent Labs Lab 05/08/16 1211  AST 39  ALT 19  ALKPHOS 107  BILITOT 1.0  PROT 6.2*  ALBUMIN 3.9   No results for input(s): LIPASE, AMYLASE in the last 168 hours. No results for input(s): AMMONIA in the last 168 hours.  CBC:  Recent Labs Lab 05/08/16 0241 05/08/16 2049  WBC 17.7* 16.1*  NEUTROABS 13.5*  --   HGB 14.9 15.2*  HCT 46.6* 47.3*  MCV 90.3 91.3  PLT 290 245    Cardiac Enzymes: No results for input(s): CKTOTAL, CKMB, CKMBINDEX, TROPONINI in the last 168 hours.  BNP: BNP (last 3 results) No results for input(s): BNP in the last 8760 hours.  ProBNP (last 3 results) No results for input(s): PROBNP in the last 8760 hours.    Other results:  Imaging: [place] Abdomen Complete  Result Date: 05/08/2016 CLINICAL DATA:  Right upper quadrant pain EXAM: ABDOMEN ULTRASOUND COMPLETE COMPARISON:  None. FINDINGS: Gallbladder: Layering debris within the  gallbladder which may be sludge or small stones. Negative for gallbladder wall thickening. Negative sonographic Murphy sign. Common bile duct: Diameter: 9.3 mm.  Intrahepatic ducts nondilated. Liver: No focal lesion identified. Within normal limits in parenchymal echogenicity. IVC: No abnormality visualized. Pancreas: Visualized portion unremarkable. Spleen: Size and appearance within normal limits. Right Kidney: Length: 10.4 cm. Echogenicity within normal limits. No mass or hydronephrosis visualized. Left Kidney: Length: 10.3 cm. Echogenicity within normal limits. No mass or hydronephrosis visualized. Abdominal aorta: No aneurysm visualized. Other findings: Bilateral pleural effusions. IMPRESSION: Layering sludge or small gallstones in the gallbladder. Common bile duct dilated at 9.3 mm. Correlate with liver function tests. Electronically Signed   By: Marlan Palauharles  Clark M.D.   On: 05/08/2016 10:19   Dg Chest Port 1 View  Result Date: 05/08/2016 CLINICAL DATA:  Worsening shortness of breath, right upper quadrant pain. EXAM: PORTABLE CHEST 1 VIEW COMPARISON:  05/07/2016 FINDINGS: Cardiomegaly. Bilateral lower lobe airspace opacities with small effusions, slightly worsened since prior study. This likely reflects edema although infection cannot be excluded. No acute bony abnormality. IMPRESSION: Worsening bilateral lower lobe airspace opacities which could reflect edema or infection. Small effusions. Electronically Signed   By: Charlett NoseKevin  Dover M.D.   On: 05/08/2016 08:53   Dg Chest Port 1 View  Result Date: 05/07/2016 CLINICAL DATA:  Shortness of breath, acute onset. Assess for pulmonary edema. Initial encounter. EXAM: PORTABLE CHEST 1 VIEW COMPARISON:  Chest radiograph performed 03/29/2015 FINDINGS: Mild vascular congestion is noted. Mild bibasilar airspace opacities likely reflect pulmonary edema, given clinical concern. No pleural effusion or pneumothorax is seen. The cardiomediastinal silhouette is borderline  normal in size. No acute osseous abnormalities are identified. IMPRESSION: Mild vascular congestion noted. Mild bibasilar airspace opacities likely reflect pulmonary edema, given clinical concern, though pneumonia could conceivably have a similar appearance. Electronically Signed   By: Roanna RaiderJeffery  Chang M.D.   On: 05/07/2016 16:15     Medications:     Scheduled Medications: . apixaban  5 mg Oral BID  . aspirin EC  81 mg Oral Daily  . buPROPion  150 mg Oral Daily  . cefTRIAXone (ROCEPHIN)  IV  1 g Intravenous Q24H  . loratadine  10 mg Oral Daily  . mouth rinse  15 mL Mouth Rinse BID  . midodrine  10 mg Oral TID WC  . pantoprazole  40 mg Oral Daily  . rosuvastatin  5 mg Oral q1800  . sodium chloride flush  3 mL Intravenous Q12H  . sodium chloride flush  3 mL Intravenous Q12H    Infusions: . phenylephrine 20mg /26150mL NS (0.08mg /ml) infusion 55 mcg/min (05/09/16 0415)  . vasopressin (  PITRESSIN) infusion - *FOR SHOCK*      PRN Medications: sodium chloride, sodium chloride, acetaminophen, ondansetron (ZOFRAN) IV, sodium chloride flush, sodium chloride flush, zolpidem   Assessment:   1. Profound hypotension 2. Acute respiratory failure 3. Vaso-vagal syndrome 4. Hypertrophic CM  5. A/C Diastolic Heart Failure 6. PAD 7.  Non-obstructive CAD  8. RUQ pain 9.Elevated WBC  10. PAF- on elliquis.   11. UTI    Plan/Discussion:    Labs pending. Blx/Urine CX pending. UTI- Day 2/3 ceftriaxone.   Over night she decompensated again with desaturations with low back pain. [place]- with gall bladder sludge. Negative for aneurysm, hydronephrosis. Improved this morning. May need oxygen at home.   Remains on Neo 55 mcg. Try to slowly wean.  Continue midodrine 10   OOB today. Try to mobilize. .   Length of Stay: 2   Amy Clegg NP_C  05/09/2016, 6:55 AM  Advanced Heart Failure Team Pager [phone] (M-F; 7a - 4p)  Please contact [facility] for night-coverage after hours (4p -7a ) and  weekends on [email or web address]  Patient seen and examined with Tonye Becket, NP. We discussed all aspects of the encounter. I agree with the assessment and plan as stated above.   Overall improving but remains on high flow O2 and neosynephrine. I think anxiety also playing a major role. Does not appear volume overloaded. Will wean O2 as tolerated. Continue neo wean keep SBP > = 80. Continue midodrine. Treat UTI. Back pain improved. Exam unimpressive. Resume home tramadol. Start clonazepam. Echo reviewed personally.   Akeel Reffner,MD 8:34 AM

## 2016-05-09 NOTE — Progress Notes (Signed)
Dr. Antoine PocheHochrein evaluated patient, no new orders at this time.  Titrated Neo gtt as charted.  After Dr. Antoine PocheHochrein left floor, patient stated that the pain felt similar to kidney stone pain that she states she had in the past.  Monitoring continues.

## 2016-05-09 NOTE — Progress Notes (Signed)
Pt was on 6 liters [place] from 0800 til 1045. At 1045 pt noted to have sats in the 70's. Pt placed back on venti mask and sats increased to low 90's. Beverly SoursAngela Marquies Wanat

## 2016-05-09 NOTE — Progress Notes (Signed)
    Called to see this patient with intermittent bouts of low back pain.  These are accompanied by sweating and wheezing and drops of her BP.  I reviewed her chart.  She says she has had this pain off and on for months but that it is increasing in frequency.  She did have an abdominal ultrasound today.  There is no aneurysm. She has gallbladder sludge with dilated bile duct.   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  Her pain is located across her lower back.    GENERAL:  Sitting up in bed.   BP (!) 162/79   Pulse 67   Temp 97.7 F (36.5 C) (Oral)   Resp (!) 21   Ht 5\' 5"  (1.651 m)   Wt 131 lb 4.8 oz (59.6 kg)   LMP  (LMP Unknown)   SpO2 97%   BMI 21.85 kg/m  LUNGS:  Basilar crackles BACK:  No CVA tenderness HEART:  PMI not displaced or sustained,S1 and S2 within normal limits, no S3, no S4, no clicks, no rubs, 3/6 systolic murmur heard out the aortic outflow tract and into the back, no diastolic murmurs ABD:  Flat, positive bowel sounds normal in frequency in pitch, no bruits, no rebound, no guarding, no midline pulsatile mass, no hepatomegaly, no splenomegaly  A/P   Back pain:  The etiology of this is not clear.  She does have findings on ultrasound as above.  However, liver enzymes are OK and the pain is not typical for acute cholecystitis.  Could consider further imaging of her lumbar spine.  I suspect the acute pain drives a vagal reaction and subsequent hypotension and transient increased pulmonary pressures and wheezing.  However, the patient is very sensitive to meds and pain management will be difficult.  No change in therapy this AM.

## 2016-05-09 NOTE — Progress Notes (Signed)
Patient continues to have intermittent episodes of low back pain with drop in Bp, drop in O2 sats, wheezing, and tachypnea.  Notified Dr. Antoine PocheHochrein of concerns relating to patient status.  Awaiting arrival for evaluation.

## 2016-05-10 LAB — URINE CULTURE

## 2016-05-10 LAB — BASIC METABOLIC PANEL
Anion gap: 6 (ref 5–15)
BUN: 25 mg/dL — AB (ref 6–20)
CALCIUM: 8.9 mg/dL (ref 8.9–10.3)
CO2: 30 mmol/L (ref 22–32)
CREATININE: 0.6 mg/dL (ref 0.44–1.00)
Chloride: 105 mmol/L (ref 101–111)
GFR calc Af Amer: >60 mL/min (ref >60)
GFR calc non Af Amer: >60 mL/min (ref >60)
Glucose, Bld: 93 mg/dL (ref 65–99)
Potassium: 3.7 mmol/L (ref 3.5–5.1)
SODIUM: 141 mmol/L (ref 135–145)

## 2016-05-10 MED ORDER — MAGNESIUM HYDROXIDE 400 MG/5ML PO SUSP: 15.0000 mL | Freq: Every day | ORAL | Status: DC | PRN

## 2016-05-10 MED ORDER — POTASSIUM CHLORIDE CRYS ER 20 MEQ PO TBCR
40.0000 meq | EXTENDED_RELEASE_TABLET | Freq: Once | ORAL | Status: AC
  Administered 2016-05-10: 40 meq via ORAL
  Filled 2016-05-10: qty 2

## 2016-05-10 NOTE — Progress Notes (Signed)
Advanced Heart Failure Rounding Note   Subjective:   Admitted post LHC with hypertension-->recevied hydralazine that resulted in hypotension and bradycardia. Also had acute respiratory distress and required NRB. Received IV fluids and started on neo. Later received IV lasix. Started on midodrine for vasovagal episodes.   Yesterday Neo weaned off and clonopin was added. Given IV lasix 20.  SBP soft.  No episodes of acute dyspnea. Unable to wean oxygen.     UA- UTI- started on ceftriaxone. Day 3/3  Urine CX-  E coli  Blood CX- NGTD [place]- gall bladder sludge. No aneurysm. No hydronephrosis ECHO- EF 65--70%.  LVOT gradient is 23 mmHg. Systolic function was   vigorous. The estimated ejection fraction was in the range of 65%   to 70%. Wall motion was normal; there were no regional wall   motion abnormalities. Doppler parameters are consistent with   abnormal left ventricular relaxation (grade 1 diastolic   dysfunction). The E/e&' ratio is >25, suggesting markedly elevated   LV Filling pressure.   LHC 05/06/2016   Mid RCA lesion, 20 %stenosed.  2nd Mrg lesion, 20 %stenosed.  Prox Cx to Mid Cx lesion, 30 %stenosed.  Mid LAD lesion, 60 %stenosed.     Objective:   Weight Range:  Vital Signs:   Temp:  [97.4 F (36.3 C)-98 F (36.7 C)] 97.5 F (36.4 C) (01/26 0428) Pulse Rate:  [58-75] 67 (01/26 0600) Resp:  [10-22] 15 (01/26 0600) BP: (76-164)/(49-99) 81/53 (01/26 0600) SpO2:  [87 %-99 %] 92 % (01/26 0600) FiO2 (%):  [50 %] 50 % (01/25 2000) Weight:  [132 lb 11.5 oz (60.2 kg)] 132 lb 11.5 oz (60.2 kg) (01/26 0400) Last BM Date: 05/08/16  Weight change: Filed Weights   05/08/16 0500 05/09/16 0442 05/10/16 0400  Weight: 131 lb 4.8 oz (59.6 kg) 126 lb 8.7 oz (57.4 kg) 132 lb 11.5 oz (60.2 kg)    Intake/Output:   Intake/Output Summary (Last 24 hours) at 05/10/16 0719 Last data filed at 05/10/16 0500  Gross per 24 hour  Intake          1771.11 ml  Output              1760 ml  Net            11.11 ml     Physical Exam: General: NAD. In bed.  HEENT: normal Neck: supple. JVP hard to assess.  Carotids 2+ bilat; no bruits. No lymphadenopathy or thryomegaly appreciated. Cor: PMI nondisplaced. Regular rate & rhythm. 2/6 SEM at RSB Lungs: mild LL crackles.  Venturi Mask  Abdomen: soft, nontender, nondistended. No hepatosplenomegaly. No bruits or masses. Good bowel sounds. Extremities: no cyanosis, clubbing, rash, edema Neuro: alert & orientedx3, cranial nerves grossly intact. moves all 4 extremities w/o difficulty. Affect pleasant  Telemetry: NSR 80s   Labs: Basic Metabolic Panel:  Recent Labs Lab 05/08/16 0241 05/08/16 1830 05/09/16 0742 05/09/16 1052 05/10/16 0343  NA 142  --   --  141 141  K 4.2  --   --  3.7 3.7  CL 110  --   --  106 105  CO2 25  --   --  28 30  GLUCOSE 82  --   --  161* 93  BUN 30*  --   --  32* 25*  CREATININE 0.80  --   --  0.83 0.60  CALCIUM 9.1  --   --  9.2 8.9  MG  --  2.0 1.9  --   --  Liver Function Tests:  Recent Labs Lab 05/08/16 1211  AST 39  ALT 19  ALKPHOS 107  BILITOT 1.0  PROT 6.2*  ALBUMIN 3.9   No results for input(s): LIPASE, AMYLASE in the last 168 hours. No results for input(s): AMMONIA in the last 168 hours.  CBC:  Recent Labs Lab 05/08/16 0241 05/08/16 2049 05/09/16 0742  WBC 17.7* 16.1* 17.2*  NEUTROABS 13.5*  --   --   HGB 14.9 15.2* 14.5  HCT 46.6* 47.3* 45.5  MCV 90.3 91.3 91.5  PLT 290 245 220    Cardiac Enzymes: No results for input(s): CKTOTAL, CKMB, CKMBINDEX, TROPONINI in the last 168 hours.  BNP: BNP (last 3 results) No results for input(s): BNP in the last 8760 hours.  ProBNP (last 3 results) No results for input(s): PROBNP in the last 8760 hours.    Other results:  Imaging: [place] Abdomen Complete  Result Date: 05/08/2016 CLINICAL DATA:  Right upper quadrant pain EXAM: ABDOMEN ULTRASOUND COMPLETE COMPARISON:  None. FINDINGS: Gallbladder: Layering  debris within the gallbladder which may be sludge or small stones. Negative for gallbladder wall thickening. Negative sonographic Murphy sign. Common bile duct: Diameter: 9.3 mm.  Intrahepatic ducts nondilated. Liver: No focal lesion identified. Within normal limits in parenchymal echogenicity. IVC: No abnormality visualized. Pancreas: Visualized portion unremarkable. Spleen: Size and appearance within normal limits. Right Kidney: Length: 10.4 cm. Echogenicity within normal limits. No mass or hydronephrosis visualized. Left Kidney: Length: 10.3 cm. Echogenicity within normal limits. No mass or hydronephrosis visualized. Abdominal aorta: No aneurysm visualized. Other findings: Bilateral pleural effusions. IMPRESSION: Layering sludge or small gallstones in the gallbladder. Common bile duct dilated at 9.3 mm. Correlate with liver function tests. Electronically Signed   By: Marlan Palau M.D.   On: 05/08/2016 10:19   Dg Chest Port 1 View  Result Date: 05/09/2016 CLINICAL DATA:  Shortness of breath. EXAM: PORTABLE CHEST 1 VIEW COMPARISON:  Radiograph of May 08, 2016. FINDINGS: Stable cardiomediastinal silhouette. No pneumothorax is noted. Stable bilateral perihilar and basilar opacities are noted concerning for edema or infiltrates, with mild bilateral associated pleural effusions. Bony thorax is unremarkable. IMPRESSION: Stable bilateral perihilar and basilar opacities concerning for edema or infiltrates with associated pleural effusions. Electronically Signed   By: Lupita Raider, M.D.   On: 05/09/2016 09:34   Dg Chest Port 1 View  Result Date: 05/08/2016 CLINICAL DATA:  Worsening shortness of breath, right upper quadrant pain. EXAM: PORTABLE CHEST 1 VIEW COMPARISON:  05/07/2016 FINDINGS: Cardiomegaly. Bilateral lower lobe airspace opacities with small effusions, slightly worsened since prior study. This likely reflects edema although infection cannot be excluded. No acute bony abnormality. IMPRESSION:  Worsening bilateral lower lobe airspace opacities which could reflect edema or infection. Small effusions. Electronically Signed   By: Charlett Nose M.D.   On: 05/08/2016 08:53     Medications:     Scheduled Medications: . apixaban  5 mg Oral BID  . aspirin EC  81 mg Oral Daily  . buPROPion  150 mg Oral Daily  . cefTRIAXone (ROCEPHIN)  IV  1 g Intravenous Q24H  . clonazePAM  0.25 mg Oral BID  . loratadine  10 mg Oral Daily  . mouth rinse  15 mL Mouth Rinse BID  . midodrine  10 mg Oral TID WC  . pantoprazole  40 mg Oral Daily  . rosuvastatin  5 mg Oral q1800  . sodium chloride flush  3 mL Intravenous Q12H  . sodium chloride flush  3 mL  Intravenous Q12H    Infusions: . phenylephrine 20mg /22850mL NS (0.08mg /ml) infusion Stopped (05/10/16 0400)  . vasopressin (PITRESSIN) infusion - *FOR SHOCK*      PRN Medications: sodium chloride, sodium chloride, acetaminophen, ondansetron (ZOFRAN) IV, sodium chloride flush, sodium chloride flush, traMADol, zolpidem   Assessment:   1. Profound hypotension 2. Acute respiratory failure 3. Vaso-vagal syndrome 4. Hypertrophic CM  5. A/C Diastolic Heart Failure 6. PAD 7.  Non-obstructive CAD  8. RUQ pain 9.Elevated WBC  10. PAF- on elliquis.   11. UTI    Plan/Discussion:    Bld CX---> No growth  Urine CX-->E coli UTI- Day 3/3 ceftriaxone.   US- with gall bladder sludge. Negative for aneurysm, hydronephrosis. Improved this morning. May need oxygen at home.   Off neo. SBP soft this morning. Give midodrine dose now. Continue midodrine 10 three times a day  ECHO EF 60-65% Grade IDD.   Need to ambulate today. Transfer to 4NP.  Length of Stay: 3   Amy Clegg NP_C  05/10/2016, 7:19 AM  Advanced Heart Failure Team Pager [phone] (M-F; 7a - 4p)  Please contact [facility] for night-coverage after hours (4p -7a ) and weekends on [email or web address]  Patient seen and examined with Tonye BecketAmy Clegg, NP. We discussed all aspects of the encounter. I  agree with the assessment and plan as stated above.   She is improving slowly. BP now more stable. Off neosynephrine. Remains O2 dependent requiring high-flow O2 at times. Receivied 20mg  IV lasix last night with good output. Weight still up from baseline. CXR yesterday with edema and effusions. Will repeat in am. Renal function stable. Continue klonopin and midodrine. Start to ambulate.   Madesyn Ast,MD 11:14 PM

## 2016-05-10 NOTE — Progress Notes (Signed)
CARDIAC REHAB PHASE I   PRE:  Rate/Rhythm: 81 SR    BP: sitting 90/70    SaO2: 86 3 1/2L  MODE:  Ambulation: 940 ft   POST:  Rate/Rhythm: 95 SR    BP: sitting 131/76     SaO2: 91 6L, rest 92 3 1/2L  Pt very eager to walk. Used RW, 6L, gait belt. BP and SaO2 low before walk. No c/o walking, denied SOB or dizziness.  Pt 91 6L walking and up to 92 3 1/2L after walking (so seemed walking made SaO2 better). BP better after walking as well. Pt happy. To recliner. Encouraged more walking and IS and sitting up. [phone]  Harriet MassonRandi Kristan Effie Janoski CES, ACSM 05/10/2016 3:06 PM

## 2016-05-11 ENCOUNTER — Inpatient Hospital Stay (HOSPITAL_COMMUNITY): Payer: Medicare Other

## 2016-05-11 LAB — BASIC METABOLIC PANEL
ANION GAP: 6 (ref 5–15)
BUN: 25 mg/dL — ABNORMAL HIGH (ref 6–20)
CALCIUM: 8.9 mg/dL (ref 8.9–10.3)
CO2: 30 mmol/L (ref 22–32)
Chloride: 106 mmol/L (ref 101–111)
Creatinine, Ser: 0.61 mg/dL (ref 0.44–1.00)
GLUCOSE: 87 mg/dL (ref 65–99)
POTASSIUM: 4.2 mmol/L (ref 3.5–5.1)
Sodium: 142 mmol/L (ref 135–145)

## 2016-05-11 MED ORDER — POTASSIUM CHLORIDE CRYS ER 20 MEQ PO TBCR
40.0000 meq | EXTENDED_RELEASE_TABLET | Freq: Once | ORAL | Status: AC
  Administered 2016-05-11: 40 meq via ORAL
  Filled 2016-05-11: qty 2

## 2016-05-11 MED ORDER — FUROSEMIDE 10 MG/ML IJ SOLN
20.0000 mg | Freq: Once | INTRAMUSCULAR | Status: AC
  Administered 2016-05-11: 20 mg via INTRAVENOUS
  Filled 2016-05-11: qty 2

## 2016-05-11 NOTE — Progress Notes (Signed)
Pt oriented to room and equipment. VSS. Telemetry applied, CCMD notified x2. Pt denies needs at this time. Call bell within reach, will continue to monitor.   Leonidas Rombergaitlin S [place]umbled[place], RN

## 2016-05-11 NOTE — Progress Notes (Signed)
Advanced Heart Failure Rounding Note   Subjective:   Admitted post LHC with hypertension-->recevied hydralazine that resulted in hypotension and bradycardia. Also had acute respiratory distress and required NRB. Received IV fluids and started on neo. Later received IV lasix. Started on midodrine for vasovagal episodes.   Yesterday Neo weaned off and clonopin was added. Given IV lasix 20.  SBP soft.  No episodes of acute dyspnea. Unable to wean oxygen.   Feels great. Wants to go home. Walking halls with CR. Sats 85% on RA. Up into 90s on 4L. BP stable. CXR improved.     UA- UTI- started on ceftriaxone. Day 3/3  Urine CX-  E coli  Blood CX- NGTD [place]- gall bladder sludge. No aneurysm. No hydronephrosis ECHO- EF 65--70%.  LVOT gradient is 23 mmHg. Systolic function was   vigorous. The estimated ejection fraction was in the range of 65%   to 70%. Wall motion was normal; there were no regional wall   motion abnormalities. Doppler parameters are consistent with   abnormal left ventricular relaxation (grade 1 diastolic   dysfunction). The E/e&' ratio is >25, suggesting markedly elevated   LV Filling pressure.   LHC 05/06/2016   Mid RCA lesion, 20 %stenosed.  2nd Mrg lesion, 20 %stenosed.  Prox Cx to Mid Cx lesion, 30 %stenosed.  Mid LAD lesion, 60 %stenosed.     Objective:   Weight Range:  Vital Signs:   Temp:  [98.1 F (36.7 C)-99 F (37.2 C)] 98.7 F (37.1 C) (01/27 1139) Pulse Rate:  [57-72] 64 (01/27 1139) Resp:  [15-17] 15 (01/27 1139) BP: (92-132)/(55-81) 116/68 (01/27 1139) SpO2:  [89 %-97 %] 92 % (01/27 1000) Weight:  [61.2 kg (135 lb)] 61.2 kg (135 lb) (01/27 0500) Last BM Date: 05/08/16  Weight change: Filed Weights   05/09/16 0442 05/10/16 0400 05/11/16 0500  Weight: 57.4 kg (126 lb 8.7 oz) 60.2 kg (132 lb 11.5 oz) 61.2 kg (135 lb)    Intake/Output:   Intake/Output Summary (Last 24 hours) at 05/11/16 1157 Last data filed at 05/11/16 0856  Gross per  24 hour  Intake              410 ml  Output                0 ml  Net              410 ml     Physical Exam: General: NAD. Walking hall HEENT: normal Neck: supple. JVP 6.  Carotids 2+ bilat; no bruits. No lymphadenopathy or thryomegaly appreciated. Cor: PMI nondisplaced. Regular rate & rhythm. 2/6 SEM at RSB Lungs: decreased throughout Abdomen: soft, nontender, nondistended. No hepatosplenomegaly. No bruits or masses. Good bowel sounds. Extremities: no cyanosis, clubbing, rash, edema Neuro: alert & orientedx3, cranial nerves grossly intact. moves all 4 extremities w/o difficulty. Affect pleasant  Telemetry: NSR 80s   Labs: Basic Metabolic Panel:  Recent Labs Lab 05/08/16 0241 05/08/16 1830 05/09/16 0742 05/09/16 1052 05/10/16 0343 05/11/16 0540  NA 142  --   --  141 141 142  K 4.2  --   --  3.7 3.7 4.2  CL 110  --   --  106 105 106  CO2 25  --   --  28 30 30   GLUCOSE 82  --   --  161* 93 87  BUN 30*  --   --  32* 25* 25*  CREATININE 0.80  --   --  0.83  0.60 0.61  CALCIUM 9.1  --   --  9.2 8.9 8.9  MG  --  2.0 1.9  --   --   --     Liver Function Tests:  Recent Labs Lab 05/08/16 1211  AST 39  ALT 19  ALKPHOS 107  BILITOT 1.0  PROT 6.2*  ALBUMIN 3.9   No results for input(s): LIPASE, AMYLASE in the last 168 hours. No results for input(s): AMMONIA in the last 168 hours.  CBC:  Recent Labs Lab 05/08/16 0241 05/08/16 2049 05/09/16 0742  WBC 17.7* 16.1* 17.2*  NEUTROABS 13.5*  --   --   HGB 14.9 15.2* 14.5  HCT 46.6* 47.3* 45.5  MCV 90.3 91.3 91.5  PLT 290 245 220    Cardiac Enzymes: No results for input(s): CKTOTAL, CKMB, CKMBINDEX, TROPONINI in the last 168 hours.  BNP: BNP (last 3 results) No results for input(s): BNP in the last 8760 hours.  ProBNP (last 3 results) No results for input(s): PROBNP in the last 8760 hours.    Other results:  Imaging: Dg Chest Port 1 View  Result Date: 05/11/2016 CLINICAL DATA:  Pulmonary edema EXAM:  PORTABLE CHEST 1 VIEW COMPARISON:  05/09/2016 FINDINGS: Improved aeration in the lung bases with persistent bibasilar atelectasis. Minimal pleural effusions. Improvement in vascular congestion and interstitial edema. Mild cardiac enlargement. Increased density over by a both bases due to overlying breast tissue. IMPRESSION: Improved aeration of the lung bases which may be due to atelectasis or pneumonia. No definite edema. Electronically Signed   By: Marlan Palauharles  Clark M.D.   On: 05/11/2016 08:02     Medications:     Scheduled Medications: . apixaban  5 mg Oral BID  . aspirin EC  81 mg Oral Daily  . buPROPion  150 mg Oral Daily  . cefTRIAXone (ROCEPHIN)  IV  1 g Intravenous Q24H  . clonazePAM  0.25 mg Oral BID  . loratadine  10 mg Oral Daily  . mouth rinse  15 mL Mouth Rinse BID  . midodrine  10 mg Oral TID WC  . pantoprazole  40 mg Oral Daily  . rosuvastatin  5 mg Oral q1800  . sodium chloride flush  3 mL Intravenous Q12H  . sodium chloride flush  3 mL Intravenous Q12H    Infusions: . phenylephrine 20mg /21550mL NS (0.08mg /ml) infusion Stopped (05/10/16 0400)  . vasopressin (PITRESSIN) infusion - *FOR SHOCK*      PRN Medications: sodium chloride, sodium chloride, acetaminophen, magnesium hydroxide, ondansetron (ZOFRAN) IV, sodium chloride flush, sodium chloride flush, traMADol, zolpidem   Assessment:   1. Profound hypotension 2. Acute respiratory failure 3. Vaso-vagal syndrome 4. Hypertrophic CM  5. A/C Diastolic Heart Failure 6. PAD 7.  Non-obstructive CAD  8. RUQ pain 9.Elevated WBC  10. PAF- on elliquis.   11. UTI   -ucx negative treated with CTX 3/3    Plan/Discussion:    Much improved but remains hypoxic with ambulation. CXR clearing. Does have copd. Will diurese with one more dose IV lasix. Give IS.  Can transfer to tele. If sats remain low in am will send home with home O2.   BP stable on midodrine.   Beverly Cocozza,MD 11:57 AM Advanced Heart Failure  Team Pager [phone] (M-F; 7a - 4p)  Please contact [facility] for night-coverage after hours (4p -7a ) and weekends on [email or web address]

## 2016-05-11 NOTE — Progress Notes (Signed)
Patient ambulated on the hall, tolerated well. Denies any SOB. Walked about 400 feet.

## 2016-05-11 NOTE — Progress Notes (Signed)
SATURATION QUALIFICATIONS: (This note is used to comply with regulatory documentation for home oxygen)  Patient Saturations on Room Air at Rest = 85%  Patient Saturations on Room Air while Ambulating = 85%  Patient Saturations on 4 Liters of oxygen while Ambulating = 92%  Please briefly explain why patient needs home oxygen: desat when walking on RA

## 2016-05-11 NOTE — Progress Notes (Signed)
CARDIAC REHAB PHASE I   PRE:  Rate/Rhythm: 58 SB  BP:  Supine:   Sitting: 116/69  Standing:    SaO2: 93% 2L   MODE:  Ambulation: 1000 ft   POST:  Rate/Rhythm: 92 SR  BP:  Supine:   Sitting: 146/82  Standing:    SaO2: 85%RA, 92% 4L 1132-1225 Pt walked 1000 ft on 4L with rolling walker with asst x 1 with steady gait. Tolerated well on oxygen. Gave pt CHF booklet and low sodium diets.  Discussed 2000 mg sodium restriction and reviewed signs/symptoms of when to call MD. Encouraged daily weights. Gave pt CRP 2 brochure and she will talk with cardiologist if interested. ? If she will qualify with EF 65-70%.   Luetta Nuttingharlene Torianne Laflam, RN BSN  05/11/2016 12:18 PM

## 2016-05-12 DIAGNOSIS — R55 Syncope and collapse: Secondary | ICD-10-CM

## 2016-05-12 LAB — BASIC METABOLIC PANEL
ANION GAP: 10 (ref 5–15)
BUN: 26 mg/dL — ABNORMAL HIGH (ref 6–20)
CALCIUM: 9.1 mg/dL (ref 8.9–10.3)
CO2: 30 mmol/L (ref 22–32)
Chloride: 103 mmol/L (ref 101–111)
Creatinine, Ser: 0.76 mg/dL (ref 0.44–1.00)
GFR calc Af Amer: >60 mL/min (ref >60)
Glucose, Bld: 108 mg/dL — ABNORMAL HIGH (ref 65–99)
Potassium: 4.1 mmol/L (ref 3.5–5.1)
SODIUM: 143 mmol/L (ref 135–145)

## 2016-05-12 MED ORDER — CLONAZEPAM 0.5 MG PO TABS: 0.2500 mg | ORAL_TABLET | Freq: Two times a day (BID) | ORAL | 0 refills | Status: DC

## 2016-05-12 MED ORDER — MIDODRINE HCL 10 MG PO TABS: 10.0000 mg | ORAL_TABLET | Freq: Three times a day (TID) | ORAL | 1 refills | Status: DC

## 2016-05-12 NOTE — Care Management Note (Signed)
Case Management Note Previous CM note initiated by Elliot CousinShavis, Alesia Ellen, RN 05/07/2016, 4:13 PM   Patient Details  Name: Beverly Li MRN: [id number] Date of Birth: January 08, 1946  Subjective/Objective:        Hypertrophic obstructive cardiomyopathy/Dyspnea on exertion, hypotension           Action/Plan: Discharge Planning: NCM spoke to pt and husband at bedside. Pt states she was independent prior to hospital stay. She was taking Eliquis prior to hospital stay. Pt may need oxygen for home. Will need walking oxygen sat.   PCP-Victoria Rankin MD    Expected Discharge Date:   05/12/16              Expected Discharge Plan:  Home/Self Care  In-House Referral:  NA  Discharge planning Services  CM Consult  Post Acute Care Choice:  NA Choice offered to:  NA  DME Arranged:  N/A DME Agency:  NA  HH Arranged:  NA HH Agency:  NA  Status of Service:  Completed, signed off  If discussed at Long Length of Stay Meetings, dates discussed:    Discharge Disposition: home/self care   Additional Comments:  05/12/16- 1400- Donn PieriniKristi Manie Bealer RN, CM- pt now on RA, will not need home 02 for discharge- no other CM needs noted for discharge.   Zenda AlpersWebster, FairviewKristi Hall, RN 05/12/2016, 2:12 PM [phone]

## 2016-05-12 NOTE — Discharge Summary (Signed)
Advanced Heart Failure Team  Discharge Summary   Patient ID: Beverly Li MRN: [id number], DOB/AGE: December 16, 1945 71 y.o. Admit date: 05/07/2016 D/C date:     05/12/2016   Primary Discharge Diagnoses:  1. Severe hypotension/shock 2. Hypertrophic obstructive Cardiomyopathy 3. Vasovagal syndrome 4. Acute respiratory failure due to acute pulmonary edema 5. COPD 6. Non-obstructive CAD 7. PAD 8. Anxiety 9. PAF 10. UTI   Hospital Course:   Beverly Li is a 71 yo female followed by Dr. Anne FuSkains with h/o HOCM and vasovagal syndrome. On 1/23 underwent diagnostic cath due to intermittent episodes of CP. Cath with non-obstructive CAD with moderate lesion in mLAD. Post cath was very hypertensive with SBP 190. Given 20mg  IV hydralazine and patient developed profound Patient became profoundly hypotensive and bradycardic likely due to concurrent vaso-vagal episode.   Dr. Clifton JamesMcalhany responded quickly and patient started on fluids.SBP dropped progressively to the 40s. HR into low 50s. I responded as well. Patient placed in supine position and started IVF wide open. Anesthesia and RRT summoned and given two 400 mcg neosynephirne boluses and started on neo drip titrated to 80 mcg/min. BP then recovered and patient developed respiratory distress due to pulmonary edema. Given low-dose lasix and moved to ICU.    In ICU patient remained hypotensive and on high flow O2. BP maintained with neosynephrine and midrodrine started. Neo eventually weaned off an BP remained stable. Patient diuresed slowly and sats improved. On day of d/c able to ambulate floor without hypoxia. Also started on low-dose Klonopin for anxiety.   UTI treated with 3 days of ceftriaxone. Bcx negative.   Echo showed normal LV function with EF 65-70% with HOCM and LVOT gradient 23mmHG at rest.  She will f/u with Dr. Anne FuSkains and also will make arrangements for her to see Dr. Graciela HusbandsKlein due to profound autonomic dysfunction.   Will send home on midodrine  10 tid. And give 30-day supply of klonopin 0.25 bid.    Discharge Vitals: Blood pressure 130/71, pulse 74, temperature 98.4 F (36.9 C), temperature source Oral, resp. rate 18, height 5\' 5"  (1.651 m), weight 60.1 kg (132 lb 9.6 oz), SpO2 95 %.  General:  Well appearing. No resp difficulty HEENT: normal Neck: supple. no JVD. Carotids 2+ bilat; no bruits. No lymphadenopathy or thryomegaly appreciated. Cor: PMI nondisplaced. Regular rate & rhythm.2/6 SEM LSB Lungs: clear but mildly decreased throughout Abdomen: soft, nontender, nondistended. No hepatosplenomegaly. No bruits or masses. Good bowel sounds. Extremities: no cyanosis, clubbing, rash, edema Neuro: alert & orientedx3, cranial nerves grossly intact. moves all 4 extremities w/o difficulty. Affect pleasant   Labs: Lab Results  Component Value Date   WBC 17.2 (H) 05/09/2016   HGB 14.5 05/09/2016   HCT 45.5 05/09/2016   MCV 91.5 05/09/2016   PLT 220 05/09/2016    Recent Labs Lab 05/08/16 1211  05/12/16 0212  NA  --   < > 143  K  --   < > 4.1  CL  --   < > 103  CO2  --   < > 30  BUN  --   < > 26*  CREATININE  --   < > 0.76  CALCIUM  --   < > 9.1  PROT 6.2*  --   --   BILITOT 1.0  --   --   ALKPHOS 107  --   --   ALT 19  --   --   AST 39  --   --   GLUCOSE  --   < >  108*  < > = values in this interval not displayed. Lab Results  Component Value Date   CHOL 208 (H) 04/30/2016   HDL 67 04/30/2016   LDLCALC 113 (H) 04/30/2016   TRIG 138 04/30/2016   BNP (last 3 results) No results for input(s): BNP in the last 8760 hours.  ProBNP (last 3 results) No results for input(s): PROBNP in the last 8760 hours.   Diagnostic Studies/Procedures   Dg Chest Port 1 View  Result Date: 05/11/2016 CLINICAL DATA:  Pulmonary edema EXAM: PORTABLE CHEST 1 VIEW COMPARISON:  05/09/2016 FINDINGS: Improved aeration in the lung bases with persistent bibasilar atelectasis. Minimal pleural effusions. Improvement in vascular congestion  and interstitial edema. Mild cardiac enlargement. Increased density over by a both bases due to overlying breast tissue. IMPRESSION: Improved aeration of the lung bases which may be due to atelectasis or pneumonia. No definite edema. Electronically Signed   By: Marlan Palau M.D.   On: 05/11/2016 08:02    Discharge Medications   Allergies as of 05/12/2016      Reactions   Amoxicillin Other (See Comments)   UTI   Atenolol Cough   Crestor [rosuvastatin Calcium]    Muscle aches   Pravastatin    Muscle aches   Sulfa Antibiotics Nausea Only   Codeine Rash      Medication List    TAKE these medications   apixaban 5 MG Tabs tablet Commonly known as:  ELIQUIS Take 1 tablet (5 mg total) by mouth 2 (two) times daily.   aspirin 81 MG tablet Take 81 mg by mouth daily.   buPROPion 150 MG 24 hr tablet Commonly known as:  WELLBUTRIN XL Take 150 mg by mouth daily.   clonazePAM 0.5 MG tablet Commonly known as:  KLONOPIN Take 0.5 tablets (0.25 mg total) by mouth 2 (two) times daily.   DEXILANT 30 MG capsule Generic drug:  Dexlansoprazole Take 1 capsule by mouth daily.   midodrine 10 MG tablet Commonly known as:  PROAMATINE Take 1 tablet (10 mg total) by mouth 3 (three) times daily with meals.   nebivolol 10 MG tablet Commonly known as:  BYSTOLIC Take 5 mg by mouth at bedtime. Takes 1/2 tablet (5 mg) once daily   rosuvastatin 5 MG tablet Commonly known as:  CRESTOR Take 1 tablet by mouth most days a week as tolerated   STRESS B COMPLEX/IRON Tabs Take 1 tablet by mouth daily. Reported on 04/05/2015   zolpidem 5 MG tablet Commonly known as:  AMBIEN Take 5 mg by mouth at bedtime as needed for sleep.       Disposition   The patient will be discharged in stable condition to home. Discharge Instructions    Diet - low sodium heart healthy    Complete by:  As directed    Increase activity slowly    Complete by:  As directed      Follow-up Information    Donato Schultz, MD  Follow up on 05/17/2016.   Specialty:  Cardiology Why:  11:00AM.  Contact information: [address] [place] [address] [phone]        Sherryl Manges, MD Follow up.   Specialty:  Cardiology Why:  office will contact you to arrange outpatient followup. Please give [place] a call if you do not hear from [place] in 3 business days Contact information: [address] [place] [address] [phone]  Duration of Discharge Encounter: Greater than 35 minutes   Signed, Arvilla Meres  MD 05/12/2016, 1:57 PM

## 2016-05-12 NOTE — Progress Notes (Signed)
Discussed with the patient and all questioned fully answered. She will call me if any problems arise. Prescriptions given.   IV removed. Telemetry removed, CCMD notified.   Leonidas Rombergaitlin S Bumbledare, RN

## 2016-05-13 LAB — CULTURE, BLOOD (ROUTINE X 2)
CULTURE: NO GROWTH
CULTURE: NO GROWTH

## 2016-05-17 ENCOUNTER — Ambulatory Visit (INDEPENDENT_AMBULATORY_CARE_PROVIDER_SITE_OTHER): Payer: Medicare Other | Admitting: Cardiology

## 2016-05-17 ENCOUNTER — Encounter: Payer: Self-pay | Admitting: Cardiology

## 2016-05-17 VITALS — BP 132/86 | HR 70 | Ht 65.0 in | Wt 132.2 lb

## 2016-05-17 DIAGNOSIS — I421 Obstructive hypertrophic cardiomyopathy: Secondary | ICD-10-CM | POA: Diagnosis not present

## 2016-05-17 DIAGNOSIS — E785 Hyperlipidemia, unspecified: Secondary | ICD-10-CM

## 2016-05-17 DIAGNOSIS — I739 Peripheral vascular disease, unspecified: Secondary | ICD-10-CM | POA: Diagnosis not present

## 2016-05-17 NOTE — Patient Instructions (Signed)
Medication Instructions:  The current medical regimen is effective;  continue present plan and medications.  Labwork: Please have fasting blood work in March as scheduled.  Follow-Up: You are scheduled to see Dr Berton MountSteve Klein 05/23/16 as listed. Follow up with Dr Anne FuSkains in 4 months as scheduled.  If you need a refill on your cardiac medications before your next appointment, please call your pharmacy.  Thank you for choosing [facility]!!

## 2016-05-17 NOTE — Progress Notes (Signed)
Cardiology Office Note   Date:  05/17/2016   ID:  Beverly Li, DOB 14-Feb-1946, MRN [id number]  PCP:  Clayborn Heron, MD  Cardiologist:   Donato Schultz, MD / Herbie [place]      History of Present Illness: Beverly Li is a 71 y.o. female  with hypertrophic obstructive cardiomyopathy, recent severe exacerbation again post cardiac catheterization and prior during femoropopliteal bypass resulting in  phenylephrine , fluids and prolonged intubation here for follow-up.  She underwent diagnostic heart catheterization on 05/07/16 and post procedure developed severe hypotension, hyper vagal response. Requiring once again phenylephrine, facemask oxygen. She did not require intubation. 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 Hydralazine was given post catheterization wound systolic pressure pressure was 190. Profound hypotension and bradycardia noted. This was treated rapidly, fluids, phenylephrine, heart rates as low as the 40s.  Midodrine started. Phenylephrine eventually weaned off. Diuresis noted. Klonopin was started for anxiety, to hopefully help blunt the hyper adrenergic response. She denies being atypically anxious person however.  Echocardiogram 04/2016 showed EF 65-70% with hypertrophic obstructive cardiomyopathy with LVOT gradient of 23 mm at rest.  She is here for follow-up today and should have appointment set up with Dr. Graciela Husbands to discuss her profound autonomic dysfunction.  In 2014 started having "heart burn" episodes. Debilitating. Out walking dog. By time she would get back, sweat, diahpesis, up neck, nausea then diarrhea. Wiped out for a few hours. Does not feel it now with exercise. Felt 3 hours irreg hb afib kicked in. Afaid to go anywhere. The sweat comes over her hot. Pain. After she described this once again, this is why I proceeded with cardiac catheterization to make sure that with her peripheral vascular disease that she did not have severe  coronary artery disease that was now becoming progressive. Thankfully, she did not have flow limiting disease. Moderate LAD, FFR.  Shortly after cardiac catheterization when her blood pressure increased to the 190 systolic range, she was given hydralazine to reduce her blood pressure and she ended up having a hyper vagal response where her heart rate decreases the 40s, blood pressure plummeted into the 50 systolic, she developed flash pulmonary edema. She was placed in the CCU, phenylephrine. Slowly, this was able to be weaned. Klonopin and Midodrin was started.  This episode post catheterization was very similar to her episode intraoperatively during her femoropopliteal bypass. She was intubated for several days after that procedure and with her arterial line demonstrated classic spike and dome changes intermittently with marked variations in her blood pressure at various points.     Past Medical History:  Diagnosis Date  . Anxiety   . Arthritis   . Atrial fibrillation (HCC)   . Bell's palsy    when pt. was 71 yrs old, when under stress the left side of face will droop.  [name] GERD (gastroesophageal reflux disease)   . History of kidney stones   . Hypertension   . Hypertrophic cardiomyopathy (HCC)    severe LV basilar hypertrophy witn no evidence of significant outflow tract obstruction, EF 65-70%, mild LAE, mild TR, grade 1a diastolic dysfunction 05/15/10 (Dr. Donato Schultz) (Atrial Septal Hypertrophy pattern)-- Intra-op TEE with dsignificant outflow tract obstruction - AI, MR & TR  . Insomnia   . Mild aortic sclerosis (HCC)   . Osteopenia   . Peripheral vascular disease (HCC)   . Syncope    , Vagal    Past Surgical History:  Procedure Laterality Date  .  BREAST ENHANCEMENT SURGERY Bilateral   . CARDIAC CATHETERIZATION N/A 05/07/2016   Procedure: Left Heart Cath and Coronary Angiography;  Surgeon: Kathleene Hazelhristopher D McAlhany, MD;  [facility];  Service: Cardiovascular;   Laterality: N/A;  . ENDARTERECTOMY FEMORAL Right 03/02/2015   Procedure: ENDARTERECTOMY RIGHT FEMORAL;  Surgeon: Chuck Hinthristopher S Dickson, MD;  [facility];  Service: Vascular;  Laterality: Right;  . FACIAL COSMETIC SURGERY    . FEMORAL-POPLITEAL BYPASS GRAFT Right 03/02/2015   Procedure: BYPASS GRAFT FEMORAL-BELOW KNEE POPLITEAL ARTERY;  Surgeon: Chuck Hinthristopher S Dickson, MD;  [facility];  Service: Vascular;  Laterality: Right;  . HERNIA REPAIR    . INGUINAL HERNIA REPAIR Bilateral   . left ovarian cyst    . PERIPHERAL VASCULAR CATHETERIZATION N/A 01/16/2015   Procedure: Abdominal Aortogram;  Surgeon: Chuck Hinthristopher S Dickson, MD;  [facility];  Service: Cardiovascular;  Laterality: N/A;  . TONSILLECTOMY       Current Outpatient Prescriptions  Medication Sig Dispense Refill  . apixaban (ELIQUIS) 5 MG TABS tablet Take 1 tablet (5 mg total) by mouth 2 (two) times daily. 60 tablet 11  . aspirin 81 MG tablet Take 81 mg by mouth daily.    [name]. buPROPion (WELLBUTRIN XL) 150 MG 24 hr tablet Take 150 mg by mouth daily.    . clonazePAM (KLONOPIN) 0.5 MG tablet Take 0.5 tablets (0.25 mg total) by mouth 2 (two) times daily. 60 tablet 0  . DEXILANT 30 MG capsule Take 1 capsule by mouth daily.    . midodrine (PROAMATINE) 10 MG tablet Take 1 tablet (10 mg total) by mouth 3 (three) times daily with meals. 90 tablet 1  . Multiple Vitamins-Iron (STRESS B COMPLEX/IRON) TABS Take 1 tablet by mouth daily. Reported on 04/05/2015    . nebivolol (BYSTOLIC) 10 MG tablet Take 5 mg by mouth at bedtime. Takes 1/2 tablet (5 mg) once daily    . rosuvastatin (CRESTOR) 5 MG tablet Take 1 tablet by mouth most days a week as tolerated 30 tablet 11  . zolpidem (AMBIEN) 5 MG tablet Take 5 mg by mouth at bedtime as needed for sleep.      No current facility-administered medications for this visit.     Allergies:   Amoxicillin; Atenolol; Crestor [rosuvastatin calcium]; Pravastatin; Sulfa antibiotics; and  Codeine    Social History:  The patient  reports that she quit smoking about 17 months ago. Her smoking use included Cigarettes. She has a 50.00 pack-year smoking history. She has never used smokeless tobacco. She reports that she drinks alcohol. She reports that she does not use drugs.   Family History:  The patient's family history includes Breast cancer in her sister; Cancer in her father, mother, and sister; Hypertension in her mother; Liver cancer in her mother; Lung cancer in her father.    ROS:  Please see the history of present illness.   Otherwise, review of systems are positive for none.   All other systems are reviewed and negative.    PHYSICAL EXAM: VS:  BP 132/86   Pulse 70   Ht 5\' 5"  (1.651 m)   Wt 132 lb 3.2 oz (60 kg)   LMP  (LMP Unknown)   BMI 22.00 kg/m  , BMI Body mass index is 22 kg/m. GEN: Well nourished, well developed, in no acute distress  HEENT: normal  Neck: no JVD, carotid bruits, or masses Cardiac: RRR; 2-3/6 systolic murmurs over precordium, no rubs, or gallops,no edema  Respiratory:  clear to auscultation bilaterally, normal work of breathing GI: soft, nontender, nondistended, + BS MS: no deformity or atrophy  Skin: warm and dry, no rash, right leg wound healing well.  Neuro:  Strength and sensation are intact Psych: euthymic mood, full affect   EKG:  EKG ordered today 04/22/16-sinus bradycardia rate 58 with biatrial enlargement, T-wave inversion noted in 1, aVL, subtle downsloping noted in V6. Note hypertrophic cardiomyopathy. Prior, sinus brady with T-wave changes same as current. Personally viewed.   Recent Labs: 05/08/2016: ALT 19 05/09/2016: Hemoglobin 14.5; Magnesium 1.9; Platelets 220 05/12/2016: BUN 26; Creatinine, Ser 0.76; Potassium 4.1; Sodium 143    Lipid Panel    Component Value Date/Time   CHOL 208 (H) 04/30/2016 1024   TRIG 138 04/30/2016 1024   HDL 67 04/30/2016 1024   CHOLHDL 3.1 04/30/2016 1024   LDLCALC 113 (H) 04/30/2016  1024      Wt Readings from Last 3 Encounters:  05/17/16 132 lb 3.2 oz (60 kg)  05/12/16 132 lb 9.6 oz (60.1 kg)  04/30/16 133 lb (60.3 kg)      Other studies Reviewed: Additional studies/ records that were reviewed today include:  Hospital records reviewed, echocardiogram reviewed. Review of the above records demonstrates:  As above   ASSESSMENT AND PLAN:  71 year old female with hypertrophic obstructive cardiomyopathy, moderate coronary artery disease, nonflow limiting LAD lesion by FFR, peripheral vascular disease requiring femoropopliteal bypass, long-standing history of hyper vagal responses.    Hypertrophic obstructive cardiomyopathy/Dyspnea on exertion  -  Avoid dehydration  -  At baseline, outflow tract is not significant  -  Intraoperative. With crossclamping, vasodilatation,   Increased adrenergic drive ,Perfect storm to elucidate obstruction.She was intubated for several days postoperatively femoropopliteal and hemodynamically post significant troubles trying to extubate given her periodic outflow tract obstruction which was severe.  -  We will try to avoid all elective surgical procedures in the future.  -  No high-risk symptoms such as ventricular tachycardia, early cardiac death in family members , unexplained syncope , septum less than 3 cm. Murmur has been present at baseline.   -  Low-dose Bystolic (decreased heart rate , decreased inotropic beneficial in her situation).   - She describes hypervagal-like experiences including nausea, vomiting, pallor, diaphoresis with subsequent diarrhea, feeling wiped out for hours. She has been battling this for years. Continue to avoid dehydration. She knows to lay down immediately if she begins to feel this.  - Now on midodrine.   - She was also given Klonopin to see if this would help temper her hyperadrenergic response.   -Dyspnea-Prior extensive smoking history- PFTs/pulmonary evaluation, Referred to pulmonary previously. Chest  x-ray PA and lateral from 09/19/12 was interpreted as underlying COPD.   Sinus bradycardia  -  Stable, no symptoms. Continue with Bystolic 5   Paroxysmal atrial fibrillation  -  Continuing with anticoagulation, Eliquis  -  No longer on amiodarone.  -  If symptoms arise/worsen, may need to resume antiarrhythmic therapy as HOCM patients do not tolerate atrial fibrillation well.  -  postoperative   Statin intolerance  -  Did not tolerate Crestor well. When raising her hands above her head her shoulders ached. Since she is stopped, she feels better. Given her peripheral vascular disease we would like her on statin therapy. She wonders if her hip pain or bursitis with statin related. However, she was holding her grandchild on that hip for quite some time.  -  Did not tolerate pravastatin 20 mg once a  day either. Her legs bother her significant only.  - Appreciate help with lipid clinic for further discussion, Zetia may be option.   Disposition:   FU with Gabriela Giannelli in 4 months. She sees Dr. Graciela Husbands in a few days.    Signed, Donato Schultz, MD  05/17/2016 1:22 PM    [facility] [address] [place], [place], [place]  [address] Phone: [phone]; Fax: [phone]

## 2016-05-23 ENCOUNTER — Encounter: Payer: Self-pay | Admitting: Internal Medicine

## 2016-05-23 ENCOUNTER — Ambulatory Visit (INDEPENDENT_AMBULATORY_CARE_PROVIDER_SITE_OTHER): Payer: Medicare Other | Admitting: Internal Medicine

## 2016-05-23 VITALS — BP 178/100 | HR 55 | Ht 65.0 in | Wt 132.6 lb

## 2016-05-23 DIAGNOSIS — G909 Disorder of the autonomic nervous system, unspecified: Secondary | ICD-10-CM

## 2016-05-23 NOTE — Progress Notes (Signed)
ELECTROPHYSIOLOGY CONSULT NOTE  Patient ID: Beverly Li, MRN: [id number], DOB/AGE: 06-17-1945 71 y.o. Admit date: (Not on file) Date of Consult: 05/23/2016  Primary Physician: Clayborn Heron, MD Primary Cardiologist: MS Consulting Physician DB  Chief Complaint: HOCM     HPI Beverly Li is a 71 y.o. female  With hypertrophic obstructive cardiomyopathy and a history of vasovagal events.  She has a history of vascular disease having undergone femoropopliteal bypass 11/16. During that hospitalization she developed hypotension and suffered respiratory failure coronary intubation phenylephrine. Weaning became difficult and she had recurrent hypotensive episodes described as vagal.  Because of recurrent problems with chest pain she underwent cardiac catheterization 1/18. No coronary disease was noted. However, she has severe postprocedural hypertension and received 20 mg IV of hydralazine and became hypotensive and then further bradycardic. She received IV fluids. Heart rate remained in the 50s. Blood pressure was recorded as low as 40 mm (?).  She was treated with Neo-Synephrine and ultimately put on a drip. Blood pressure recovered and she developed respiratory distress and pulmonary edema.   She did not require intubation. She was however put on oral vasoconstrictors i.e. ProAmatine. She was also started at some point on a low dose beta blocker although she's had a history of that in the past of significant bradycardia   Last weeks she has been doing pretty well. She is exercising albeit effort.  She also has a history of "vasovagal events" that go back  Years.  They have become increasingly frequent. They are mostly associated with meals or shortly after meals. The are stereotypical with nausea and diaphoresis a sensation that her throat is closing sometimes with diarrhea and then relieved about 15 or 20 minutes later. There has been no syncope. Interestingly, the same symptoms  accompanying her response to the hydralazine. She had been concerned that alcohol was contributing; she has eliminated and continued to have spells.    Echocardiogram 1/18 demonstrated a 2.2 cm septum. Sam was present. LVOT gradient was 23 there is also a peak gradient across the aortic valve 42.  An echo 11/16 demonstrated "moderate LVH"and was described and no gradients were reported  Her sons have been screened by echo and apparently are normal. Her siblings have been advised but testing is not clear. Genetic testing has not been performed   Past Medical History:  Diagnosis Date  . Anxiety   . Arthritis   . Atrial fibrillation (HCC)   . Bell's palsy    when pt. was 71 yrs old, when under stress the left side of face will droop.  [name] GERD (gastroesophageal reflux disease)   . History of kidney stones   . Hypertension   . Hypertrophic cardiomyopathy (HCC)    severe LV basilar hypertrophy witn no evidence of significant outflow tract obstruction, EF 65-70%, mild LAE, mild TR, grade 1a diastolic dysfunction 05/15/10 (Dr. Donato Schultz) (Atrial Septal Hypertrophy pattern)-- Intra-op TEE with dsignificant outflow tract obstruction - AI, MR & TR  . Insomnia   . Mild aortic sclerosis (HCC)   . Osteopenia   . Peripheral vascular disease (HCC)   . Syncope    , Vagal      Surgical History:  Past Surgical History:  Procedure Laterality Date  . BREAST ENHANCEMENT SURGERY Bilateral   . CARDIAC CATHETERIZATION N/A 05/07/2016   Procedure: Left Heart Cath and Coronary Angiography;  Surgeon: Kathleene Hazel, MD;  Location: [facility] INVASIVE CV LAB;  Service: Cardiovascular;  Laterality:  N/A;  . ENDARTERECTOMY FEMORAL Right 03/02/2015   Procedure: ENDARTERECTOMY RIGHT FEMORAL;  Surgeon: Chuck Hint, MD;  Location: [facility] OR;  Service: Vascular;  Laterality: Right;  . FACIAL COSMETIC SURGERY    . FEMORAL-POPLITEAL BYPASS GRAFT Right 03/02/2015   Procedure: BYPASS GRAFT FEMORAL-BELOW KNEE  POPLITEAL ARTERY;  Surgeon: Chuck Hint, MD;  Location: [facility] OR;  Service: Vascular;  Laterality: Right;  . HERNIA REPAIR    . INGUINAL HERNIA REPAIR Bilateral   . left ovarian cyst    . PERIPHERAL VASCULAR CATHETERIZATION N/A 01/16/2015   Procedure: Abdominal Aortogram;  Surgeon: Chuck Hint, MD;  Location: [facility] INVASIVE CV LAB;  Service: Cardiovascular;  Laterality: N/A;  . TONSILLECTOMY       Home Meds: Prior to Admission medications   Medication Sig Start Date End Date Taking? Authorizing Provider  apixaban (ELIQUIS) 5 MG TABS tablet Take 1 tablet (5 mg total) by mouth 2 (two) times daily. 11/27/15  Yes [name], MD  aspirin 81 MG tablet Take 81 mg by mouth daily.   Yes Historical Provider, MD  buPROPion (WELLBUTRIN XL) 150 MG 24 hr tablet Take 150 mg by mouth daily.   Yes Historical Provider, MD  clonazePAM (KLONOPIN) 0.5 MG tablet Take 0.5 tablets (0.25 mg total) by mouth 2 (two) times daily. 05/12/16  Yes Azalee Course, PA  DEXILANT 30 MG capsule Take 1 capsule by mouth daily. 01/17/16  Yes Historical Provider, MD  midodrine (PROAMATINE) 10 MG tablet Take 1 tablet (10 mg total) by mouth 3 (three) times daily with meals. 05/12/16  Yes Azalee Course, PA  Multiple Vitamins-Iron (STRESS B COMPLEX/IRON) TABS Take 1 tablet by mouth daily. Reported on 04/05/2015   Yes Historical Provider, MD  nebivolol (BYSTOLIC) 10 MG tablet Take 5 mg by mouth at bedtime. Takes 1/2 tablet (5 mg) once daily   Yes Historical Provider, MD  rosuvastatin (CRESTOR) 5 MG tablet Take 1 tablet by mouth most days a week as tolerated 04/30/16  Yes [name], MD  zolpidem (AMBIEN) 5 MG tablet Take 5 mg by mouth at bedtime as needed for sleep.  07/04/15  Yes Historical Provider, MD    Allergies:  Allergies  Allergen Reactions  . Amoxicillin Other (See Comments)    UTI  . Atenolol Cough  . Crestor [Rosuvastatin Calcium]     Muscle aches  . Pravastatin     Muscle aches  . Sulfa Antibiotics Nausea Only  .  Codeine Rash    Social History   Social History  . Marital status: Married    Spouse name: N/A  . Number of children: N/A  . Years of education: N/A   Occupational History  . Not on file.   Social History Main Topics  . Smoking status: Former Smoker    Packs/day: 1.00    Years: 50.00    Types: Cigarettes    Quit date: 12/15/2014  . Smokeless tobacco: Never Used  . Alcohol use Yes     Comment: 1-2 per week  . Drug use: No  . Sexual activity: Not on file   Other Topics Concern  . Not on file   Social History Narrative  . No narrative on file     Family History  Problem Relation Age of Onset  . Liver cancer Mother   . Cancer Mother     Liver  . Hypertension Mother   . Lung cancer Father   . Cancer Father     Lung  .  Breast cancer Sister   . Cancer Sister     Breast     ROS:  Please see the history of present illness.     All other systems reviewed and negative.    Physical Exam: Blood pressure (!) 178/100, pulse (!) 55, height 5\' 5"  (1.651 m), weight 132 lb 9.6 oz (60.1 kg), SpO2 96 %. General: Well developed, well nourished female in no acute distress. Head: Normocephalic, atraumatic, sclera non-icteric, no xanthomas, nares are without discharge. EENT: normal  Residual asymmetry from a Bell's palsy Lymph Nodes:  none Neck: Negative for carotid bruits. JVD not elevated. Back:without scoliosis kyphosis Lungs: Clear bilaterally to auscultation without wheezes, rales, or rhonchi. Breathing is unlabored. Heart: RRR with S1 S2.  2/6 systolic  Murmur increases with Valsalva  . No rubs, or gallops appreciated. Abdomen: Soft, non-tender, non-distended with normoactive bowel sounds. No hepatomegaly. No rebound/guarding. No obvious abdominal masses. Msk:  Strength and tone appear normal for age. Extremities: No clubbing or cyanosis. No edema.  Distal pedal pulses are 2+ and equal bilaterally. Skin: Warm and Dry Neuro: Alert and oriented X 3. CN III-XII intact Grossly  normal sensory and motor function . Psych:  Responds to questions appropriately with a normal affect.      Labs: Cardiac Enzymes No results for input(s): CKTOTAL, CKMB, TROPONINI in the last 72 hours. CBC Lab Results  Component Value Date   WBC 17.2 (H) 05/09/2016   HGB 14.5 05/09/2016   HCT 45.5 05/09/2016   MCV 91.5 05/09/2016   PLT 220 05/09/2016   PROTIME: No results for input(s): LABPROT, INR in the last 72 hours. Chemistry No results for input(s): NA, K, CL, CO2, BUN, CREATININE, CALCIUM, PROT, BILITOT, ALKPHOS, ALT, AST, GLUCOSE in the last 168 hours.  Invalid input(s): LABALBU Lipids Lab Results  Component Value Date   CHOL 208 (H) 04/30/2016   HDL 67 04/30/2016   LDLCALC 113 (H) 04/30/2016   TRIG 138 04/30/2016   BNP No results found for: PROBNP Thyroid Function Tests: No results for input(s): TSH, T4TOTAL, T3FREE, THYROIDAB in the last 72 hours.  Invalid input(s): FREET3 Miscellaneous Lab Results  Component Value Date   DDIMER 2.72 (H) 05/08/2016    Radiology/Studies:  [place] Abdomen Complete  Result Date: 05/08/2016 CLINICAL DATA:  Right upper quadrant pain EXAM: ABDOMEN ULTRASOUND COMPLETE COMPARISON:  None. FINDINGS: Gallbladder: Layering debris within the gallbladder which may be sludge or small stones. Negative for gallbladder wall thickening. Negative sonographic Murphy sign. Common bile duct: Diameter: 9.3 mm.  Intrahepatic ducts nondilated. Liver: No focal lesion identified. Within normal limits in parenchymal echogenicity. IVC: No abnormality visualized. Pancreas: Visualized portion unremarkable. Spleen: Size and appearance within normal limits. Right Kidney: Length: 10.4 cm. Echogenicity within normal limits. No mass or hydronephrosis visualized. Left Kidney: Length: 10.3 cm. Echogenicity within normal limits. No mass or hydronephrosis visualized. Abdominal aorta: No aneurysm visualized. Other findings: Bilateral pleural effusions. IMPRESSION: Layering  sludge or small gallstones in the gallbladder. Common bile duct dilated at 9.3 mm. Correlate with liver function tests. Electronically Signed   By: Marlan Palauharles  Clark M.D.   On: 05/08/2016 10:19   Dg Chest Port 1 View  Result Date: 05/11/2016 CLINICAL DATA:  Pulmonary edema EXAM: PORTABLE CHEST 1 VIEW COMPARISON:  05/09/2016 FINDINGS: Improved aeration in the lung bases with persistent bibasilar atelectasis. Minimal pleural effusions. Improvement in vascular congestion and interstitial edema. Mild cardiac enlargement. Increased density over by a both bases due to overlying breast tissue. IMPRESSION: Improved aeration of  the lung bases which may be due to atelectasis or pneumonia. No definite edema. Electronically Signed   By: Marlan Palau M.D.   On: 05/11/2016 08:02   Dg Chest Port 1 View  Result Date: 05/09/2016 CLINICAL DATA:  Shortness of breath. EXAM: PORTABLE CHEST 1 VIEW COMPARISON:  Radiograph of May 08, 2016. FINDINGS: Stable cardiomediastinal silhouette. No pneumothorax is noted. Stable bilateral perihilar and basilar opacities are noted concerning for edema or infiltrates, with mild bilateral associated pleural effusions. Bony thorax is unremarkable. IMPRESSION: Stable bilateral perihilar and basilar opacities concerning for edema or infiltrates with associated pleural effusions. Electronically Signed   By: Lupita Raider, M.D.   On: 05/09/2016 09:34   Dg Chest Port 1 View  Result Date: 05/08/2016 CLINICAL DATA:  Worsening shortness of breath, right upper quadrant pain. EXAM: PORTABLE CHEST 1 VIEW COMPARISON:  05/07/2016 FINDINGS: Cardiomegaly. Bilateral lower lobe airspace opacities with small effusions, slightly worsened since prior study. This likely reflects edema although infection cannot be excluded. No acute bony abnormality. IMPRESSION: Worsening bilateral lower lobe airspace opacities which could reflect edema or infection. Small effusions. Electronically Signed   By: Charlett Nose  M.D.   On: 05/08/2016 08:53   Dg Chest Port 1 View  Result Date: 05/07/2016 CLINICAL DATA:  Shortness of breath, acute onset. Assess for pulmonary edema. Initial encounter. EXAM: PORTABLE CHEST 1 VIEW COMPARISON:  Chest radiograph performed 03/29/2015 FINDINGS: Mild vascular congestion is noted. Mild bibasilar airspace opacities likely reflect pulmonary edema, given clinical concern. No pleural effusion or pneumothorax is seen. The cardiomediastinal silhouette is borderline normal in size. No acute osseous abnormalities are identified. IMPRESSION: Mild vascular congestion noted. Mild bibasilar airspace opacities likely reflect pulmonary edema, given clinical concern, though pneumonia could conceivably have a similar appearance. Electronically Signed   By: Roanna Raider M.D.   On: 05/07/2016 16:15    EKG:  sinus at 57 18/08/47 Biatrial enlargement LVH with repolarization   Assessment and Plan:   Hypertrophic obstructive cardiomyopathy  Sinus bradycardia  Hypertension  Vasovagal events  The patient has hypertrophic obstructive cardiomyopathy. Sinus bradycardia precludes the use of significant beta blockers and/or calcium blockers. She has had 2 life-threatening events in the context of acute hypotension which occurred in hospital. Notably she has recurrent events associated with epiphenomena suggestive of neurally mediated syndrome. Interestingly, the symptoms that she has had were identical to those that occurred with the exposure to the hydralazine and her most recent near catastrophic event  She now is hypertensive. We will work on gradually weaning her off the [employer].  With her bradycardia precluding beta blockers and/or calcium blockers (more than what she is currently taking) I wonder whether she would benefit from pacing with its potential impact on LV outflow gradients, with a she might further benefit from disopyramide. Given the complexities of the interplay between her  autonomic symptoms and her HCM I've told her that I would like to return to Dr. Regino Schultze at [facility] who is a [employer] manager. I will also ask him about whether a referral to him is most appropriate.  She asks about exercise. I suggested that exercising to a heart rate in the 80s and 90s is probably reasonable. She should use a heart rate monitor  I also think that an MRI might be clarifying;  furthermore, echocardiogram looking for a provokable gradient may also be helpful to understand triggers for her near catastrophes.  I will discuss these with Dr. Regino Schultze  Virl Axe

## 2016-06-18 ENCOUNTER — Telehealth: Payer: Self-pay | Admitting: Internal Medicine

## 2016-06-18 ENCOUNTER — Telehealth: Payer: Self-pay | Admitting: Cardiology

## 2016-06-18 DIAGNOSIS — G909 Disorder of the autonomic nervous system, unspecified: Secondary | ICD-10-CM

## 2016-06-18 MED ORDER — MIDODRINE HCL 10 MG PO TABS: 5.0000 mg | ORAL_TABLET | Freq: Three times a day (TID) | ORAL | 11 refills | Status: DC

## 2016-06-18 NOTE — Telephone Encounter (Signed)
Given her hypertension, let's try midodrine 2.5 mg 3 times a day. I reviewed Dr. Odessa FlemingKlein's consultation note. He did discuss trying to decrease this medication. He [facility]. Donato SchultzMark Chelbi Herber, MD

## 2016-06-18 NOTE — Telephone Encounter (Signed)
Spoke with patient who states she is concerned about BP being too high at Dr. Barbaraann Barthelankins' office last week. She states the person in the office used both manual and automatic BP cuffs and reading was 170's mmHg systolic. She states her home readings have also been high. I reviewed proper BP technique with her and she explains she has been taking her BP in the morning after she wakes up and before she takes medication. I advised her to keep a log of BP taken at least 30 min after eating and taking medications. I reviewed sodium intake and hydration with her. She states she is drinking lots of Propel because she does not like the taste of plain water. I advised her that she needs to decrease Propel and increase intake of plain water (advised that she could add fruit or cucumber to it to help taste). She states she has an appointment later today with Dr. Barbaraann Barthelankins and will take her home BP cuff for comparison. She is currently taking bystolic 5 mg at bedtime and midodrine 5 mg TID.  I advised her to call back with additional information and that I will route message to Dr. Anne FuSkains for advice. She verbalized understanding and agreement.

## 2016-06-18 NOTE — Telephone Encounter (Signed)
Left message for patient to call back for medication changes per Dr. Anne FuSkains

## 2016-06-18 NOTE — Telephone Encounter (Signed)
New message   Pt verbalized that she is still waiting on a referral from Dr.Klein

## 2016-06-18 NOTE — Telephone Encounter (Signed)
Patient states she would like to send Dr. Graciela HusbandsKlein a reminder to consult with [facility] about her condition. I advised her that I will route message to Dr. Graciela HusbandsKlein who will return to the office later this week.

## 2016-06-18 NOTE — Telephone Encounter (Signed)
Pt c/o BP issue: STAT if pt c/o blurred vision, one-sided weakness or slurred speech  1. What are your last 5 BP readings? 167/100  154/98  2. Are you having any other symptoms (ex. Dizziness, headache, blurred vision, passed out)? no 3. What is your BP issue? High

## 2016-06-18 NOTE — Telephone Encounter (Signed)
Incoming DOD call from [facility]. Dr. Ladona Ridgelaylor instructed to STOP Midodrine and f/u with nurse visit for BP and EKG on a day when Dr. Graciela HusbandsKlein is here. Will forward to scheduling.

## 2016-06-19 ENCOUNTER — Telehealth: Payer: Self-pay | Admitting: Cardiology

## 2016-06-19 NOTE — Telephone Encounter (Signed)
Follow Up   Per pt her husband took message from nurse, and he couldn't remember all of the message. Patient is calling back to speak with nurse.

## 2016-06-19 NOTE — Telephone Encounter (Signed)
DOD call to Dr. Ladona Ridgelaylor yesterday from Dr. Barbaraann Barthelankins- orders received from Dr. Ladona Ridgelaylor to d/c midodrine. The patient was made aware by Dr. Barbaraann Barthelankins.

## 2016-06-19 NOTE — Telephone Encounter (Signed)
New message  [facility]  Pt c/o BP issue: STAT if pt c/o blurred vision, one-sided weakness or slurred speech  1. What are your last 5 BP readings? Yesterday-196/110 189/115 this morning-about the same  2. Are you having any other symptoms (ex. Dizziness, headache, blurred vision, passed out)? No   3. What is your BP issue? Pt states she stopped taking a medication yesterday.

## 2016-06-19 NOTE — Telephone Encounter (Signed)
I spoke with the patient and advised her I had not called, but the scheduler Melissa had called to arrange a nurse visit BP/ EKG per Dr. Ladona Ridgelaylor.  I advised her I am still waiting on feedback from Dr. Anne FuSkains/ Dr. Graciela HusbandsKlein and hopefully I will be able to call her back tomorrow.  She voices understanding.

## 2016-06-19 NOTE — Telephone Encounter (Signed)
I called and spoke with the patient. She has been having elevated BP readings that she called about earlier this week.  She saw Dr. Barbaraann Barthelankins yesterday for an unrelated issue per her report. Dr. Barbaraann Barthelankins called here about the patient's BP readings and per Dr. Ladona Ridgelaylor (DOD) was told to stop midodrine.  She reports her BP reading yesterday was 196/110 after medications. This morning she was 189/115 before meds. She did get in 3 doses of midodrine yesterday.  She is currently on bystolic 10 mg - 1/2 tablet (5 mg) daily. She reports that she was started on her midodrine back in January after her cath due to drops in her BP. Amlodipine was stopped by Dr. Anne FuSkains in the fall per the patient as her BP was under control. Her last BP drop was this past Friday afternoon- BP 90/74 HR- 131 (she reports being in a-fib for 2 hours).   She is currently asymptomatic- I have advised her that she should monitor her BP's today around lunch time and this evening. She is also aware that I will forward to Dr. Anne FuSkains to review her BP readings for any further recommendations. She is aware that I will speak with Dr. Graciela HusbandsKlein in regards to him touching base [facility]. We will back in touch with her later this week.   She is agreeable.

## 2016-06-20 ENCOUNTER — Other Ambulatory Visit: Payer: Self-pay | Admitting: Internal Medicine

## 2016-06-20 MED ORDER — MIDODRINE HCL 10 MG PO TABS: ORAL_TABLET | ORAL | Status: DC

## 2016-06-20 MED ORDER — HYDRALAZINE HCL 25 MG PO TABS: ORAL_TABLET | ORAL | 2 refills | Status: DC

## 2016-06-20 NOTE — Telephone Encounter (Signed)
Reviewed BP readings with Dr. Graciela HusbandsKlein for the last few days- per Dr. Graciela HusbandsKlein patient needs to take midodrine 5 mg PRN for SBP < 100. If she has a SBP 140-160, then no other changes- if higher, then she should take hydralazine 25 mg every 8 hours as needed for SBP > 160.  I spoke with the patient. Her BP this morning about 30 minutes after taking her bystolic was 178/104 & at 2 pm she was 168/104. Per Dr. Graciela HusbandsKlein - have her take hydralazine 25 mg every 8 hours as needed for SBP > 160  The patient is agreeable with this, she is also agreeable with taking midodrine 5 mg as needed for SBP < 100.  We will cancel her BP check/ EKG for 06/25/16. She is going out of town next week- I advised her that Dr. Graciela HusbandsKlein has sent another message to Dr. Regino SchultzeWang and we are waiting on a response from him.  I will call her back the week of 07/01/16 to follow up.  She is agreeable.

## 2016-06-20 NOTE — Telephone Encounter (Signed)
I think given her wide swings in blood pressures, with once again hypotensive episode, I would at least keep her midodrine at current dosing 5 mg 3 times a day. Donato SchultzMark Nyah Shepherd, MD

## 2016-06-21 NOTE — Telephone Encounter (Signed)
This was just sent in yesterday but just wanted to clarify if okay to refill for a ninety day supply with refills. Please advise. Thanks, MI

## 2016-06-21 NOTE — Telephone Encounter (Signed)
Ok, but I would only give 1 refill.  Thanks!

## 2016-06-24 ENCOUNTER — Other Ambulatory Visit: Payer: Medicare Other

## 2016-06-25 ENCOUNTER — Other Ambulatory Visit: Payer: Medicare Other

## 2016-06-26 ENCOUNTER — Telehealth: Payer: Self-pay | Admitting: Cardiology

## 2016-06-26 NOTE — Telephone Encounter (Signed)
New Message     Pt is going out of town and she is concerned with her bp This medication  midodrine (PROAMATINE) 10 MG tablet Take 1/2 tablet (5 mg) three times a day as needed for systolic blood pressure (top number) < 100.      hydrALAZINE (APRESOLINE) 25 MG tablet TAKE 1 TABLET BY MOUTH EVERY 8 HOURS AS NEEDED FOR SYSTOLIC BLOOD PRESSURE(TOP NUMBER)>160   The fluctuation is very dramatic

## 2016-06-27 NOTE — Telephone Encounter (Signed)
Beverly MullNorma J Li 21 hours ago (2:33 PM)      New Message     Pt is going out of town and she is concerned with her bp This medication  midodrine (PROAMATINE) 10 MG tablet Take 1/2 tablet (5 mg) three times a day as needed for systolic blood pressure (top number) < 100.      hydrALAZINE (APRESOLINE) 25 MG tablet TAKE 1 TABLET BY MOUTH EVERY 8 HOURS AS NEEDED FOR SYSTOLIC BLOOD PRESSURE(TOP NUMBER)>160   The fluctuation is very dramatic       Documentation

## 2016-06-27 NOTE — Telephone Encounter (Signed)
Please see phone note from 06/19/2016

## 2016-06-29 ENCOUNTER — Other Ambulatory Visit: Payer: Self-pay | Admitting: Physician Assistant

## 2016-07-01 NOTE — Telephone Encounter (Signed)
Will you please review for refill, thanks. 

## 2016-07-01 NOTE — Telephone Encounter (Signed)
Pharmacy requesting a refill on Clonazepam 0.5 mg. Would you like to refill? Please advise

## 2016-07-04 ENCOUNTER — Telehealth: Payer: Self-pay | Admitting: Internal Medicine

## 2016-07-04 DIAGNOSIS — I421 Obstructive hypertrophic cardiomyopathy: Secondary | ICD-10-CM

## 2016-07-04 NOTE — Telephone Encounter (Signed)
I spoke with the patient.  She reports her BP has been running SBP has been running 155-178 mostly. She has only taken hydralazine a few times. She has only had 2 episodes of low BP's at 117/77 and 90/74 (with A-fib). I have advised her to use hydralazine 25 mg every 8 hours as needed for SBP > 160. I have advised her if her SBP is 161 to go ahead and take a dose. She will use this more often for elevated SBP > 160. She is concerned about all the high readings she is having. I have advised her I will call her back on Tuesday when Dr. Graciela HusbandsKlein is back and follow up on her BP readings and then we will get MD recommendations. She is agreeable. She is also aware that Dr. Graciela HusbandsKlein had not heard back from Dr. Regino SchultzeWang before he left the country.

## 2016-07-04 NOTE — Telephone Encounter (Signed)
New Message:   She wants you to know her blood pressure is still high.She wants to know if she needs to come in?

## 2016-07-07 ENCOUNTER — Other Ambulatory Visit: Payer: Self-pay | Admitting: Physician Assistant

## 2016-07-08 NOTE — Telephone Encounter (Signed)
Please review for refill. Thanks!  

## 2016-07-08 NOTE — Telephone Encounter (Signed)
Her situation is challenging  She will have to deal with BP like this as it is variable Would have her take only 12.5 hydralazine however  What is her followup

## 2016-07-08 NOTE — Telephone Encounter (Signed)
Pharmacy requesting a refill on clonazepam .5 mg tablet. Would you like to refill this medication? Please advise

## 2016-07-09 NOTE — Telephone Encounter (Signed)
I called and spoke with the patient- she states she has had not lows for her BP's. This morning she was 139/89. Last night around 10:00 pm she was 175/104. She states this is about average for her.  She has only been taking hydralazine at night as her am readings are good- she does not check her BP during the day. I advised I will review with Dr. Graciela HusbandsKlein and call her back.  She is agreeable. She will be available on her cell today between 3pm-5pm.

## 2016-07-09 NOTE — Telephone Encounter (Signed)
Please obtain from PCP 

## 2016-07-09 NOTE — Telephone Encounter (Signed)
Reviewed with Dr. Graciela HusbandsKlein- he feels the patient would be fine taking no more than 2 doses of hydralazine a day- the patient should take her BP around lunch time and if her SBP is > 160, take a dose of hydralazine at that time.  She will check her BP at bedtime as well and take a dose of hydralazine if needed.  Also, Dr. Graciela HusbandsKlein spoke with Dr. Regino SchultzeWang- recommendation is for the patient to have a Cardiac MRI done for evaluation of HOCM.  The patient is aware and agreeable.

## 2016-07-10 ENCOUNTER — Telehealth: Payer: Self-pay | Admitting: Internal Medicine

## 2016-07-10 ENCOUNTER — Telehealth: Payer: Self-pay | Admitting: Pharmacist

## 2016-07-10 MED ORDER — EZETIMIBE 10 MG PO TABS: 10.0000 mg | ORAL_TABLET | Freq: Every day | ORAL | 11 refills | Status: DC

## 2016-07-10 NOTE — Telephone Encounter (Signed)
Spoke with pt to reschedule lipid profile appt that she missed. She states that she has been taking Crestor but only 2.5mg  twice weekly. She experienced muscle aches on the 5mg  3 days a week dosing. Will add on Zetia 10mg  daily and recheck lipid panel at upcoming OV with Dr Anne FuSkains in June.

## 2016-07-10 NOTE — Telephone Encounter (Signed)
Called the patient to find out what time or day she wanted her MRI.  [place] is leaving for the beach on 07[place]16 and will be gone a few weeks.  She was wondering if they had an opening next week in the afternoon.

## 2016-07-18 ENCOUNTER — Ambulatory Visit (HOSPITAL_COMMUNITY)
Admission: RE | Admit: 2016-07-18 | Discharge: 2016-07-18 | Disposition: A | Discharge status: Home / Self Care | Payer: Medicare Other | Source: Ambulatory Visit | Attending: Internal Medicine | Admitting: Internal Medicine

## 2016-07-18 DIAGNOSIS — I340 Nonrheumatic mitral (valve) insufficiency: Secondary | ICD-10-CM | POA: Diagnosis not present

## 2016-07-18 DIAGNOSIS — I421 Obstructive hypertrophic cardiomyopathy: Secondary | ICD-10-CM | POA: Diagnosis not present

## 2016-07-18 LAB — CREATININE, SERUM
CREATININE: 0.7 mg/dL (ref 0.44–1.00)
GFR calc Af Amer: >60 mL/min (ref >60)
GFR calc non Af Amer: >60 mL/min (ref >60)

## 2016-07-18 MED ORDER — GADOBENATE DIMEGLUMINE 529 MG/ML IV SOLN
20.0000 mL | Freq: Once | INTRAVENOUS | Status: AC | PRN
  Administered 2016-07-18: 20 mL via INTRAVENOUS

## 2016-07-19 DIAGNOSIS — I422 Other hypertrophic cardiomyopathy: Secondary | ICD-10-CM | POA: Diagnosis not present

## 2016-07-24 ENCOUNTER — Telehealth: Payer: Self-pay | Admitting: *Deleted

## 2016-07-24 NOTE — Telephone Encounter (Signed)
Beverly Li,   Let's see what Dr. Graciela Husbands thinks. Tough situation with her highs and lows. I'm not sure how to counsel her.   Dr. Graciela Husbands stated this about MRI:  Notes recorded by Duke Salvia, MD on 07/20/2016 at 9:54 AM EDT Please Inform Patient that -MRI consistent with HCM not something else strange  is abnormal and will require further eval  Rec from [facility] was to do stress echo-- please schedule when I am in the office-- and she will need IV  Thanks   Donato Schultz, MD

## 2016-07-24 NOTE — Telephone Encounter (Signed)
Spoke with patient who is concerned about her BP reporting that it is "sky high".  She reports it is elevated everyday being between 160-190/over 100.  When it is above 160 systolic, she takes Hydralazine.  When it is below 100 systolic she takes Midodrine. This AM it was 181/105. Last night about 7 pm it dropped to 72/56 and she took Midodrine but later around 11 PM it was 182/111 and she took Hydralazine.  Right now it is 145/99 with HR of 62.  She reports being very frustrated about it and doesn't want to continue to have to worry about it all the time.  She is asking if she shouldn't be on something everyday to control her HTN because it is "dangerously high at times".  Advise I will review with Dr Anne Fu and call her back.  She would also like the results of her cardiac MRI that Dr Graciela Husbands ordered.

## 2016-07-25 ENCOUNTER — Other Ambulatory Visit: Payer: Self-pay | Admitting: *Deleted

## 2016-07-25 ENCOUNTER — Encounter: Payer: Self-pay | Admitting: Internal Medicine

## 2016-07-25 DIAGNOSIS — I10 Essential (primary) hypertension: Secondary | ICD-10-CM

## 2016-07-25 DIAGNOSIS — I421 Obstructive hypertrophic cardiomyopathy: Secondary | ICD-10-CM

## 2016-07-25 DIAGNOSIS — I959 Hypotension, unspecified: Secondary | ICD-10-CM

## 2016-07-25 NOTE — Telephone Encounter (Signed)
Spoke with pt at length RE: results and need for further testing.  Stress echo ordered.  Pt aware per Dr Graciela Husbands to check BP every 30 mins for 1 day and keep a diary of it so that he and Dr Anne Fu can have a better idea of how much the BP is changing during the day.  She will bring this diary in when she has the stress echo.  Pt is going out of town tomorrow for 10 days.  She will be scheduled for the stress echo for when she returns.  Pt will continue on medications as listed for now.  She will c/b with any questions/concerns.

## 2016-07-30 ENCOUNTER — Telehealth (HOSPITAL_COMMUNITY): Payer: Self-pay | Admitting: Cardiology

## 2016-07-31 ENCOUNTER — Encounter: Payer: Self-pay | Admitting: Cardiology

## 2016-08-02 NOTE — Telephone Encounter (Signed)
07/30/2016 03:11 PM Phone (Outgoing) Delbert, Vu (Self) [phone] (M)   Left Message - Called pt and lmsg for her to CB to r/s stress echo to a different day.     By Elita Boone    07/30/2016 03:17 PM Phone (Outgoing) Johny Shears B    There are openings on 5/10 and Dr. Anne Fu is in the office. Doctor will need to be present on day of the test.     By Elita Boone    08/01/2016 08:24 AM Phone (Outgoing) Desarie, Feild (Self) [phone] (H)   Left Message - Called pt and lmsg for her to CB to r/s Stress Echo    By Elita Boone

## 2016-08-15 ENCOUNTER — Telehealth (HOSPITAL_COMMUNITY): Payer: Self-pay | Admitting: *Deleted

## 2016-08-15 NOTE — Telephone Encounter (Signed)
Patient given detailed instructions per Stress Test Requisition Sheet for test on 08/22/16 at 2:30.Patient Notified to arrive 30 minutes early, and that it is imperative to arrive on time for appointment to keep from having the test rescheduled.  Patient verbalized understanding. Daneil DolinSharon S Li

## 2016-08-21 ENCOUNTER — Other Ambulatory Visit (HOSPITAL_COMMUNITY): Payer: Medicare Other

## 2016-08-21 ENCOUNTER — Encounter (HOSPITAL_COMMUNITY): Payer: Medicare Other

## 2016-08-21 ENCOUNTER — Ambulatory Visit: Payer: Medicare Other | Admitting: Vascular Surgery

## 2016-08-22 ENCOUNTER — Ambulatory Visit (HOSPITAL_COMMUNITY): Payer: Medicare Other | Attending: Cardiology

## 2016-08-22 ENCOUNTER — Ambulatory Visit (HOSPITAL_BASED_OUTPATIENT_CLINIC_OR_DEPARTMENT_OTHER): Payer: Medicare Other

## 2016-08-22 DIAGNOSIS — I421 Obstructive hypertrophic cardiomyopathy: Secondary | ICD-10-CM | POA: Diagnosis not present

## 2016-08-22 DIAGNOSIS — I959 Hypotension, unspecified: Secondary | ICD-10-CM | POA: Diagnosis not present

## 2016-08-22 DIAGNOSIS — I10 Essential (primary) hypertension: Secondary | ICD-10-CM

## 2016-08-27 ENCOUNTER — Encounter: Payer: Self-pay | Admitting: Vascular Surgery

## 2016-09-04 ENCOUNTER — Other Ambulatory Visit (HOSPITAL_COMMUNITY): Payer: Medicare Other

## 2016-09-04 ENCOUNTER — Encounter (HOSPITAL_COMMUNITY): Payer: Medicare Other

## 2016-09-04 ENCOUNTER — Ambulatory Visit: Payer: Medicare Other | Admitting: Vascular Surgery

## 2016-09-16 ENCOUNTER — Encounter: Payer: Self-pay | Admitting: Cardiology

## 2016-09-16 ENCOUNTER — Other Ambulatory Visit: Payer: Medicare Other | Admitting: *Deleted

## 2016-09-16 ENCOUNTER — Ambulatory Visit (INDEPENDENT_AMBULATORY_CARE_PROVIDER_SITE_OTHER): Payer: Medicare Other | Admitting: Cardiology

## 2016-09-16 VITALS — BP 118/78 | HR 62 | Ht 65.0 in | Wt 134.4 lb

## 2016-09-16 DIAGNOSIS — I421 Obstructive hypertrophic cardiomyopathy: Secondary | ICD-10-CM | POA: Diagnosis not present

## 2016-09-16 DIAGNOSIS — G909 Disorder of the autonomic nervous system, unspecified: Secondary | ICD-10-CM | POA: Diagnosis not present

## 2016-09-16 DIAGNOSIS — E7800 Pure hypercholesterolemia, unspecified: Secondary | ICD-10-CM

## 2016-09-16 NOTE — Patient Instructions (Signed)
Medication Instructions:  The current medical regimen is effective;  continue present plan and medications.   Follow-Up: Follow up in 4 months with Dr. Skains.  You will receive a letter in the mail 2 months before you are due.  Please call us when you receive this letter to schedule your follow up appointment.   If you need a refill on your cardiac medications before your next appointment, please call your pharmacy.  Thank you for choosing [facility]!!      

## 2016-09-16 NOTE — Addendum Note (Signed)
Addended by: Sharin GraveFLEMING, Altair Stanko J on: 09/16/2016 11:55 AM   Modules accepted: Orders

## 2016-09-16 NOTE — Progress Notes (Signed)
Cardiology Office Note   Date:  09/16/2016   ID:  Beverly MeekerFrances B Depaz, DOB 1945-10-28, MRN [id number]  PCP:  Clayborn Heronankins, Victoria R, MD  Cardiologist:   Donato SchultzMark Rosevelt Luu, MD / Graciela HusbandsKlein       History of Present Illness: Beverly Li is a 71 y.o. female  with hypertrophic obstructive cardiomyopathy, recent severe exacerbation again post cardiac catheterization and prior during femoropopliteal bypass resulting in  phenylephrine , fluids and prolonged intubation here for follow-up.  She underwent diagnostic heart catheterization on 05/07/16 and post procedure developed severe hypotension, hyper vagal response. Requiring once again phenylephrine, facemask oxygen. She did not require intubation. 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 Hydralazine was given post catheterization wound systolic pressure pressure was 190. Profound hypotension and bradycardia noted. This was treated rapidly, fluids, phenylephrine, heart rates as low as the 40s.  Midodrine started. Phenylephrine eventually weaned off. Diuresis noted. Klonopin was started for anxiety, to hopefully help blunt the hyper adrenergic response. She denies being atypically anxious person however.  Echocardiogram 04/2016 showed EF 65-70% with hypertrophic obstructive cardiomyopathy with LVOT gradient of 23 mm at rest.  She is here for follow-up today and should have appointment set up with Dr. Graciela HusbandsKlein to discuss her profound autonomic dysfunction.  In 2014 started having "heart burn" episodes. Debilitating. Out walking dog. By time she would get back, sweat, diahpesis, up neck, nausea then diarrhea. Wiped out for a few hours. Does not feel it now with exercise. Felt 3 hours irreg hb afib kicked in. Afaid to go anywhere. The sweat comes over her hot. Pain. After she described this once again, this is why I proceeded with cardiac catheterization to make sure that with her peripheral vascular disease that she did not have severe  coronary artery disease that was now becoming progressive. Thankfully, she did not have flow limiting disease. Moderate LAD, FFR.  Shortly after cardiac catheterization when her blood pressure increased to the 190 systolic range, she was given hydralazine to reduce her blood pressure and she ended up having a hyper vagal response where her heart rate decreases the 40s, blood pressure plummeted into the 50 systolic, she developed flash pulmonary edema. She was placed in the CCU, phenylephrine. Slowly, this was able to be weaned. Klonopin and Midodrin was started.  This episode post catheterization was very similar to her episode intraoperatively during her femoropopliteal bypass. She was intubated for several days after that procedure and with her arterial line demonstrated classic spike and dome changes intermittently with marked variations in her blood pressure at various points.  09/16/16-since her last visit she has had cardiac MRI and stress echocardiogram. Both were reassuring. She did not drop her blood pressures during exercise. She did not increase her gradient significantly during exercise. We were able to invoke an increase left ventricular output tract gradient with Valsalva maneuver. Please see report. No ventricular arrhythmias. She states that over the last 3 months she's felt the best that she's felt in quite some time. She is drinking sports drinks quite frequently, propel. When she does have low blood pressure usually has a prodrome, very vagal-like response as noted previously.   Past Medical History:  Diagnosis Date  . Anxiety   . Arthritis   . Atrial fibrillation (HCC)   . Bell's palsy    when pt. was 71 yrs old, when under stress the left side of face will droop.  [name]. GERD (gastroesophageal reflux disease)   . History  of kidney stones   . Hypertension   . Hypertrophic cardiomyopathy (HCC)    severe LV basilar hypertrophy witn no evidence of significant outflow tract obstruction,  EF 65-70%, mild LAE, mild TR, grade 1a diastolic dysfunction 05/15/10 (Dr. Donato Schultz) (Atrial Septal Hypertrophy pattern)-- Intra-op TEE with dsignificant outflow tract obstruction - AI, MR & TR  . Insomnia   . Mild aortic sclerosis (HCC)   . Osteopenia   . Peripheral vascular disease (HCC)   . Syncope    , Vagal    Past Surgical History:  Procedure Laterality Date  . BREAST ENHANCEMENT SURGERY Bilateral   . CARDIAC CATHETERIZATION N/A 05/07/2016   Procedure: Left Heart Cath and Coronary Angiography;  Surgeon: Kathleene Hazel, MD;  Location: [facility] INVASIVE CV LAB;  Service: Cardiovascular;  Laterality: N/A;  . ENDARTERECTOMY FEMORAL Right 03/02/2015   Procedure: ENDARTERECTOMY RIGHT FEMORAL;  Surgeon: Chuck Hint, MD;  Location: [facility] OR;  Service: Vascular;  Laterality: Right;  . FACIAL COSMETIC SURGERY    . FEMORAL-POPLITEAL BYPASS GRAFT Right 03/02/2015   Procedure: BYPASS GRAFT FEMORAL-BELOW KNEE POPLITEAL ARTERY;  Surgeon: Chuck Hint, MD;  Location: [facility] OR;  Service: Vascular;  Laterality: Right;  . HERNIA REPAIR    . INGUINAL HERNIA REPAIR Bilateral   . left ovarian cyst    . PERIPHERAL VASCULAR CATHETERIZATION N/A 01/16/2015   Procedure: Abdominal Aortogram;  Surgeon: Chuck Hint, MD;  Location: [facility] INVASIVE CV LAB;  Service: Cardiovascular;  Laterality: N/A;  . TONSILLECTOMY       Current Outpatient Prescriptions  Medication Sig Dispense Refill  . apixaban (ELIQUIS) 5 MG TABS tablet Take 1 tablet (5 mg total) by mouth 2 (two) times daily. 60 tablet 11  . aspirin 81 MG tablet Take 81 mg by mouth daily.    [name] buPROPion (WELLBUTRIN XL) 150 MG 24 hr tablet Take 150 mg by mouth daily.    . clonazePAM (KLONOPIN) 0.5 MG tablet Take 0.5 tablets (0.25 mg total) by mouth 2 (two) times daily. 60 tablet 0  . DEXILANT 30 MG capsule Take 1 capsule by mouth daily.    [name] ezetimibe (ZETIA) 10 MG tablet Take 1 tablet (10 mg total) by mouth daily. 30 tablet 11  .  hydrALAZINE (APRESOLINE) 25 MG tablet TAKE 1 TABLET BY MOUTH EVERY 8 HOURS AS NEEDED FOR SYSTOLIC BLOOD PRESSURE(TOP NUMBER)>160 270 tablet 0  . midodrine (PROAMATINE) 10 MG tablet Take 1/2 tablet (5 mg) three times a day as needed for systolic blood pressure (top number) < 100.    . Multiple Vitamins-Iron (STRESS B COMPLEX/IRON) TABS Take 1 tablet by mouth daily. Reported on 04/05/2015    . nebivolol (BYSTOLIC) 10 MG tablet Take 5 mg by mouth at bedtime. Takes 1/2 tablet (5 mg) once daily    . rosuvastatin (CRESTOR) 5 MG tablet Take 1 tablet by mouth most days a week as tolerated 30 tablet 11  . zolpidem (AMBIEN) 5 MG tablet Take 5 mg by mouth at bedtime as needed for sleep.      No current facility-administered medications for this visit.     Allergies:   Amoxicillin; Atenolol; Crestor [rosuvastatin calcium]; Pravastatin; Sulfa antibiotics; and Codeine    Social History:  The patient  reports that she quit smoking about 21 months ago. Her smoking use included Cigarettes. She has a 50.00 pack-year smoking history. She has never used smokeless tobacco. She reports that she drinks alcohol. She reports that she does not use drugs.   Family  History:  The patient's family history includes Breast cancer in her sister; Cancer in her father, mother, and sister; Hypertension in her mother; Liver cancer in her mother; Lung cancer in her father.    ROS:  Please see the history of present illness.   Otherwise, review of systems are positive for none.   All other systems are reviewed and negative.    PHYSICAL EXAM: VS:  LMP  (LMP Unknown)  , BMI There is no height or weight on file to calculate BMI. GEN: Well nourished, well developed, in no acute distress  HEENT: normal  Neck: no JVD, carotid bruits, or masses Cardiac: RRR; 2/6 systolic murmur  over precordium, no rubs, or gallops,no edema  Respiratory:  clear to auscultation bilaterally, normal work of breathing GI: soft, nontender, nondistended, +  BS MS: no deformity or atrophy  Skin: warm and dry, no rash, right leg wound healing well.  Neuro:  Strength and sensation are intact Psych: euthymic mood, full affect   EKG:  EKG ordered today 04/22/16-sinus bradycardia rate 58 with biatrial enlargement, T-wave inversion noted in 1, aVL, subtle downsloping noted in V6. Note hypertrophic cardiomyopathy. Prior, sinus brady with T-wave changes same as current. Personally viewed.   Recent Labs: 05/08/2016: ALT 19 05/09/2016: Hemoglobin 14.5; Magnesium 1.9; Platelets 220 05/12/2016: BUN 26; Potassium 4.1; Sodium 143 07/18/2016: Creatinine, Ser 0.70    Lipid Panel    Component Value Date/Time   CHOL 208 (H) 04/30/2016 1024   TRIG 138 04/30/2016 1024   HDL 67 04/30/2016 1024   CHOLHDL 3.1 04/30/2016 1024   LDLCALC 113 (H) 04/30/2016 1024      Wt Readings from Last 3 Encounters:  05/23/16 132 lb 9.6 oz (60.1 kg)  05/17/16 132 lb 3.2 oz (60 kg)  05/12/16 132 lb 9.6 oz (60.1 kg)      Other studies Reviewed: Additional studies/ records that were reviewed today include:  Hospital records reviewed, echocardiogram reviewed. Review of the above records demonstrates:  As above   ASSESSMENT AND PLAN:  71 year old female with hypertrophic obstructive cardiomyopathy, moderate coronary artery disease, nonflow limiting LAD lesion by FFR, peripheral vascular disease requiring femoropopliteal bypass, long-standing history of hyper vagal responses.    Hypertrophic obstructive cardiomyopathy/Dyspnea on exertion  -  Avoid dehydration  -  At baseline, outflow tract is not significant  -  Intraoperative. With crossclamping, vasodilatation,   Increased adrenergic drive ,Perfect storm to elucidate obstruction.She was intubated for several days postoperatively femoropopliteal and hemodynamically post significant troubles trying to extubate given her periodic outflow tract obstruction which was severe.  -  We will try to avoid all elective surgical  procedures in the future.  -  No high-risk symptoms such as ventricular tachycardia, early cardiac death in family members , unexplained syncope , septum less than 3 cm. Murmur has been present at baseline.   -  Low-dose Bystolic (decreased heart rate , decreased inotropic beneficial in her situation).   - She describes hypervagal-like experiences including nausea, vomiting, pallor, diaphoresis with subsequent diarrhea, feeling wiped out for hours. She has been battling this for years. Continue to avoid dehydration. She is drinking propel. She knows to lay down immediately if she begins to feel this.  - Now on midodrine.   - Thankfully the stress echocardiogram did not show any drops in blood pressure with exercise. She did not show any increased gradient during exercise on echocardiogram. She did however when performing Valsalva increase her gradient. Please see report. No ventricular arrhythmias.  -  MRI did not show any evidence of SAM, minimal gradient.    -Dyspnea-Prior extensive smoking history- PFTs/pulmonary evaluation, Referred to pulmonary previously. Chest x-ray PA and lateral from 09/19/12 was interpreted as underlying COPD.   Sinus bradycardia  -  Stable, no symptoms. Continue with Bystolic 5  - She takes hydralazine if her blood pressure gets over 160 occasionally. She takes midodrine if it drops significantly but she has not taken this recently.    Paroxysmal atrial fibrillation  -  Continuing with anticoagulation, Eliquis  -  No longer on amiodarone.  -  If symptoms arise/worsen, may need to resume antiarrhythmic therapy as HOCM patients do not tolerate atrial fibrillation well.  -  postoperative   Statin intolerance  -  Did not tolerate Crestor well. When raising her hands above her head her shoulders ached. Since she is stopped, she feels better. Given her peripheral vascular disease we would like her on statin therapy. She wonders if her hip pain or bursitis with statin  related. However, she was holding her grandchild on that hip for quite some time.  -  Did not tolerate pravastatin 20 mg once a day either. Her legs bother her significant only.  - Appreciate help with lipid clinic for further discussion, Zetia may be option.   Disposition:   FU with Babita Amaker in 4 months.  Signed, Donato Schultz, MD  09/16/2016 10:45 AM    [facility] [address] [place], [place], [place]  [address] Phone: [phone]; Fax: [phone]

## 2016-09-17 LAB — LIPID PANEL
CHOL/HDL RATIO: 2.2 ratio (ref 0.0–4.4)
CHOLESTEROL TOTAL: 211 mg/dL — AB (ref 100–199)
HDL: 97 mg/dL (ref >39)
LDL CALC: 98 mg/dL (ref 0–99)
TRIGLYCERIDES: 78 mg/dL (ref 0–149)
VLDL CHOLESTEROL CAL: 16 mg/dL (ref 5–40)

## 2016-10-23 ENCOUNTER — Encounter: Payer: Self-pay | Admitting: Vascular Surgery

## 2016-11-06 ENCOUNTER — Ambulatory Visit (HOSPITAL_COMMUNITY)
Admission: RE | Admit: 2016-11-06 | Discharge: 2016-11-06 | Disposition: A | Discharge status: Home / Self Care | Payer: Medicare Other | Source: Ambulatory Visit | Attending: Vascular Surgery | Admitting: Vascular Surgery

## 2016-11-06 ENCOUNTER — Ambulatory Visit (INDEPENDENT_AMBULATORY_CARE_PROVIDER_SITE_OTHER)
Admission: RE | Admit: 2016-11-06 | Discharge: 2016-11-06 | Disposition: A | Discharge status: Home / Self Care | Payer: Medicare Other | Source: Ambulatory Visit | Attending: Vascular Surgery | Admitting: Vascular Surgery

## 2016-11-06 ENCOUNTER — Ambulatory Visit (INDEPENDENT_AMBULATORY_CARE_PROVIDER_SITE_OTHER): Payer: Medicare Other | Admitting: Family

## 2016-11-06 ENCOUNTER — Encounter: Payer: Self-pay | Admitting: Family

## 2016-11-06 VITALS — BP 156/92 | HR 64 | Temp 97.6°F | Resp 16 | Ht 65.0 in | Wt 133.0 lb

## 2016-11-06 DIAGNOSIS — I70209 Unspecified atherosclerosis of native arteries of extremities, unspecified extremity: Secondary | ICD-10-CM | POA: Diagnosis not present

## 2016-11-06 DIAGNOSIS — I779 Disorder of arteries and arterioles, unspecified: Secondary | ICD-10-CM | POA: Diagnosis not present

## 2016-11-06 DIAGNOSIS — Z95828 Presence of other vascular implants and grafts: Secondary | ICD-10-CM | POA: Diagnosis not present

## 2016-11-06 DIAGNOSIS — Z87891 Personal history of nicotine dependence: Secondary | ICD-10-CM

## 2016-11-06 NOTE — Progress Notes (Signed)
Vitals:   11/06/16 1506  BP: (!) 170/95  Pulse: 64  Resp: 16  Temp: 97.6 F (36.4 C)  SpO2: 96%  Weight: 133 lb (60.3 kg)  Height: 5\' 5"  (1.651 m)

## 2016-11-06 NOTE — Patient Instructions (Signed)

## 2016-11-06 NOTE — Progress Notes (Signed)
[facility] [place]   CC: Follow up peripheral artery occlusive disease  History of Present Illness Beverly Li is a 71 y.o. female who presents for follow up ABI's and by pass duplex s/p right fem to below knee popliteal by pass with composite vein,  2 pieces of right GSV spliced together, on 03-02-15 by Dr. Edilia Boickson.    She has continued to exercise daily and is eating a healthy diet.   She was having such severe hip pain bilaterally she could barely walk.  Once the statin drugs were stopped her hip pain went away.  She continues to take a daily aspirin, beta blocker and Eliquis.   Dr. Edilia Boickson last evaluated pt on 02-21-16. At that time pt was very happy with her surgical out come.  She did not want to continue to try statin medication due to an intolerance, stated her total cholesterol was < 200.  We recommend good eating habits with a plant and fruit based intake.   She was to f/u in 6 months for repeat ABI's and right by pass arterial duplex.  She denies any claudication symptoms with walking.  She denies any known history of stroke or TIA.   She had Bell's Palsy years ago and has had left side facial droop due to this.   Pt Diabetic: No Pt smoker: former smoker, quit on 12-15-14 after 50 years smoking  Pt meds include: Statin :low dose Crestor 2 days/week and muscles still ache but not as much as when she takes a daily statin Betablocker: Yes ASA: Yes Other anticoagulants/antiplatelets: Eliquis  Past Medical History:  Diagnosis Date  . Anxiety   . Arthritis   . Atrial fibrillation (HCC)   . Bell's palsy    when pt. was 71 yrs old, when under stress the left side of face will droop.  [name]. GERD (gastroesophageal reflux disease)   . History of kidney stones   . Hypertension   . Hypertrophic cardiomyopathy (HCC)    severe LV basilar hypertrophy witn no evidence of significant outflow tract obstruction, EF 65-70%, mild LAE, mild TR, grade 1a diastolic  dysfunction 05/15/10 (Dr. Donato SchultzMark Skains) (Atrial Septal Hypertrophy pattern)-- Intra-op TEE with dsignificant outflow tract obstruction - AI, MR & TR  . Insomnia   . Mild aortic sclerosis (HCC)   . Osteopenia   . Peripheral vascular disease (HCC)   . Syncope    , Vagal    Social History Social History  Substance Use Topics  . Smoking status: Former Smoker    Packs/day: 1.00    Years: 50.00    Types: Cigarettes    Quit date: 12/15/2014  . Smokeless tobacco: Never Used  . Alcohol use Yes     Comment: 1-2 per week    Family History Family History  Problem Relation Age of Onset  . Liver cancer Mother   . Cancer Mother        Liver  . Hypertension Mother   . Lung cancer Father   . Cancer Father        Lung  . Breast cancer Sister   . Cancer Sister        Breast    Past Surgical History:  Procedure Laterality Date  . BREAST ENHANCEMENT SURGERY Bilateral   . CARDIAC CATHETERIZATION N/A 05/07/2016   Procedure: Left Heart Cath and Coronary Angiography;  Surgeon: Kathleene Hazelhristopher D McAlhany, MD;  [facility];  Service: Cardiovascular;  Laterality: N/A;  . ENDARTERECTOMY FEMORAL  Right 03/02/2015   Procedure: ENDARTERECTOMY RIGHT FEMORAL;  Surgeon: Chuck Hinthristopher S Dickson, MD;  [facility];  Service: Vascular;  Laterality: Right;  . FACIAL COSMETIC SURGERY    . FEMORAL-POPLITEAL BYPASS GRAFT Right 03/02/2015   Procedure: BYPASS GRAFT FEMORAL-BELOW KNEE POPLITEAL ARTERY;  Surgeon: Chuck Hinthristopher S Dickson, MD;  [facility];  Service: Vascular;  Laterality: Right;  . HERNIA REPAIR    . INGUINAL HERNIA REPAIR Bilateral   . left ovarian cyst    . PERIPHERAL VASCULAR CATHETERIZATION N/A 01/16/2015   Procedure: Abdominal Aortogram;  Surgeon: Chuck Hinthristopher S Dickson, MD;  [facility];  Service: Cardiovascular;  Laterality: N/A;  . TONSILLECTOMY      Allergies  Allergen Reactions  . Amoxicillin Other (See Comments)    UTI  . Atenolol Cough  . Crestor  [Rosuvastatin Calcium]     Muscle aches  . Pravastatin     Muscle aches  . Sulfa Antibiotics Nausea Only  . Codeine Rash    Current Outpatient Prescriptions  Medication Sig Dispense Refill  . apixaban (ELIQUIS) 5 MG TABS tablet Take 1 tablet (5 mg total) by mouth 2 (two) times daily. 60 tablet 11  . aspirin 81 MG tablet Take 81 mg by mouth daily.    [name]. buPROPion (WELLBUTRIN XL) 150 MG 24 hr tablet Take 150 mg by mouth daily.    [name]. DEXILANT 30 MG capsule Take 1 capsule by mouth daily.    . hydrALAZINE (APRESOLINE) 25 MG tablet TAKE 1 TABLET BY MOUTH EVERY 8 HOURS AS NEEDED FOR SYSTOLIC BLOOD PRESSURE(TOP NUMBER)>160 270 tablet 0  . midodrine (PROAMATINE) 10 MG tablet Take 1/2 tablet (5 mg) three times a day as needed for systolic blood pressure (top number) < 100.    . Multiple Vitamins-Iron (STRESS B COMPLEX/IRON) TABS Take 1 tablet by mouth daily. Reported on 04/05/2015    . nebivolol (BYSTOLIC) 10 MG tablet Take 5 mg by mouth at bedtime. Takes 1/2 tablet (5 mg) once daily    . rosuvastatin (CRESTOR) 5 MG tablet Patient takes 2.5mg  by mouth on Monday and Tuesday.    . ezetimibe (ZETIA) 10 MG tablet Take 1 tablet (10 mg total) by mouth daily. 30 tablet 11  . zolpidem (AMBIEN) 5 MG tablet Take 5 mg by mouth at bedtime as needed for sleep.      No current facility-administered medications for this visit.     ROS: See HPI for pertinent positives and negatives.   Physical Examination  Vitals:   11/06/16 1506 11/06/16 1515  BP: (!) 170/95 (!) 156/92  Pulse: 64 64  Resp: 16   Temp: 97.6 F (36.4 C)   SpO2: 96%   Weight: 133 lb (60.3 kg)   Height: 5\' 5"  (1.651 m)    Body mass index is 22.13 kg/m.  General: A&O x 3, WDWN, female. Gait: normal Eyes: PERRLA. Pulmonary: Respirations are non labored, CTAB, good air movement Cardiac: regular rhythm and rate, +murmur.         Carotid Bruits Right Left   Negative Negative   Radial pulses: right is not palpable, left is 2+  palpable. Right brachial pulse is 3+ palpable.  Adominal aortic pulse is not palpable                         VASCULAR EXAM: Extremities without ischemic changes, without Gangrene; without open wounds.  LE Pulses Right Left       FEMORAL  2+ palpable  1+ palpable        POPLITEAL  not palpable   not palpable       POSTERIOR TIBIAL  1+ palpable   not palpable        DORSALIS PEDIS      ANTERIOR TIBIAL 2+ palpable  not palpable    Abdomen: soft, NT, no palpable masses. Skin: no rashes, no ulcers noted. Musculoskeletal: no muscle wasting or atrophy.  Neurologic: A&O X 3; Appropriate Affect ; SENSATION: normal; MOTOR FUNCTION:  moving all extremities equally, motor strength 5/5 throughout. Speech is fluent/normal. CN 2-12 intact except left side facial droop since Bell's Palsy years ago.    ASSESSMENT: Beverly Li is a 71 y.o. female who is s/p right fem to below knee popliteal by pass with composite vein,  2 pieces of right GSV spliced together, on 03-02-15. She no longer has claudication symptoms with walking and she has been walking more which has resulted in improved left ABI, right remains normal.   DATA  Right LE Arterial Duplex (11/06/16): Right common femoral artery measures 1.8 cm AP by 1.95 cm transverse.  Patent right common femoral to below knee popliteal bypass graft with no evidence for restenosis.  No change since the prior exam on 02-21-16.   ABI (Date: 11/06/2016)  R:   ABI: 1.09 (was 1.09 on 02-21-16),   PT: tri  DP: tri  TBI:  0.76 (was 0.75)  L:   ABI: 0.84 (was 0.75),   PT: mono  DP: mono  TBI: 0.66 (was 0.48) Right ABI remain and TBI remain normal with triphasic waveforms. Left ABI has improved from 75% to 84%, monophasic waveforms, left TBI has improved from 0.48 to 0.66.     PLAN:  Based on the patient's vascular studies and  examination, and after discussing with Dr. Edilia Bo diameters of right CFA, pt will return to clinic in 6 months with ABI's and right LE arterial duplex.  I discussed in depth with the patient the nature of atherosclerosis, and emphasized the importance of maximal medical management including strict control of blood pressure, blood glucose, and lipid levels, obtaining regular exercise, and continued cessation of smoking.  The patient is aware that without maximal medical management the underlying atherosclerotic disease process will progress, limiting the benefit of any interventions.  The patient was given information about PAD including signs, symptoms, treatment, what symptoms should prompt the patient to seek immediate medical care, and risk reduction measures to take.  Charisse March, RN, MSN, FNP-C [facility] [place] Phone: [phone]  Clinic MD: Edilia Bo  11/06/16 3:18 PM

## 2016-11-15 NOTE — Addendum Note (Signed)
Addended by: Burton ApleyPETTY, Deandre Stansel A on: 11/15/2016 09:49 AM   Modules accepted: Orders

## 2016-12-14 ENCOUNTER — Other Ambulatory Visit: Payer: Self-pay | Admitting: Cardiology

## 2017-01-10 IMAGING — CR DG CHEST 1V PORT
1 series · 1 of 1 positions shown · non-contrast
Comparison: Portable exam 5930 hours compared to 03/02/2015

CLINICAL DATA: Central line placement, hypertension, hypertrophic
cardiomyopathy, atrial fibrillation, former smoker, GERD

EXAM:
PORTABLE CHEST 1 VIEW

[AP]
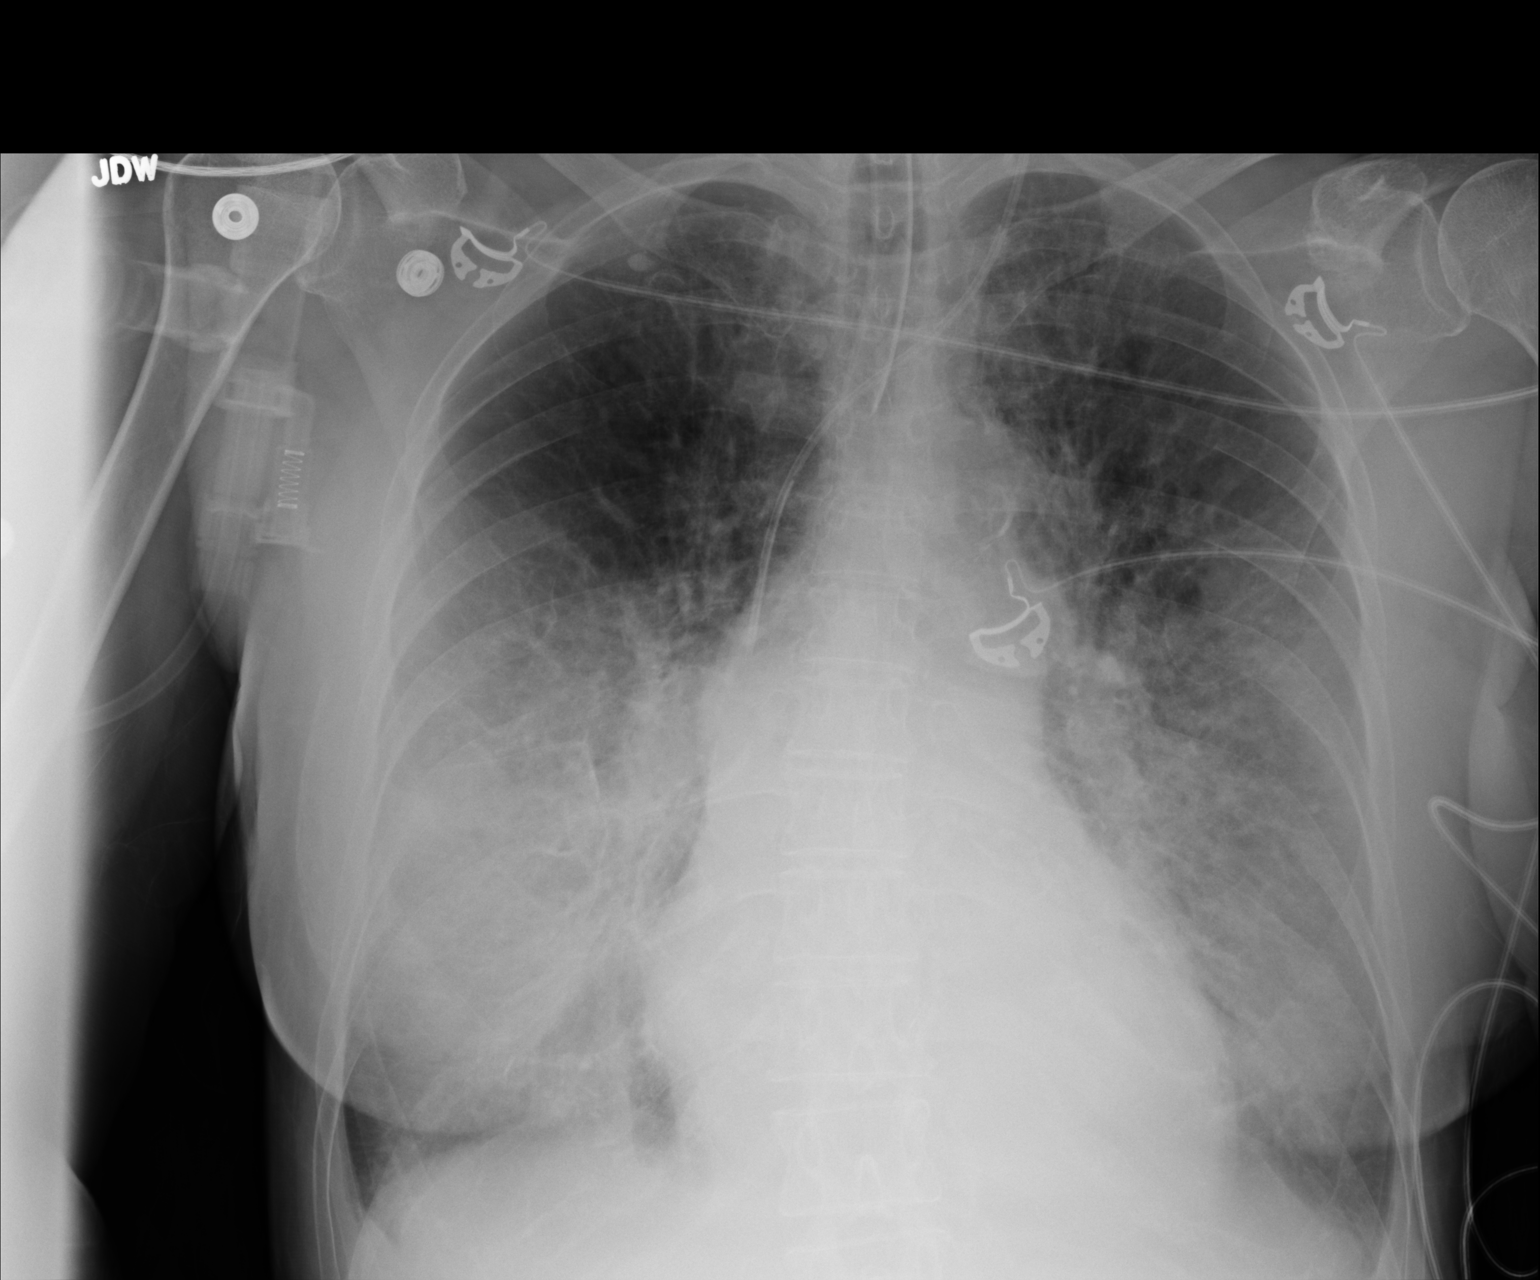

[1 of 1 positions shown; findings below may reference images not displayed]

FINDINGS: Tip of endotracheal tube projects 4.7 cm above carina.

New LEFT jugular central venous catheter tip projecting over SVC.

Stable heart size and mediastinal contours.

Pulmonary vascular congestion.

COPD changes with perihilar infiltrates likely pulmonary edema.

No gross pleural effusion or pneumothorax.

Bones demineralized.

Capsular calcification in the RIGHT breast prosthesis.
IMPRESSION: No pneumothorax following LEFT jugular line placement.

Pulmonary vascular congestion and perihilar infiltrates question
pulmonary edema, question minimally improved since previous study.

## 2017-01-11 IMAGING — CR DG CHEST 1V PORT
1 series · 1 of 1 positions shown · non-contrast
Comparison: 03/02/2015

CLINICAL DATA: Check endotracheal tube placement

EXAM:
PORTABLE CHEST - 1 VIEW

[AP]
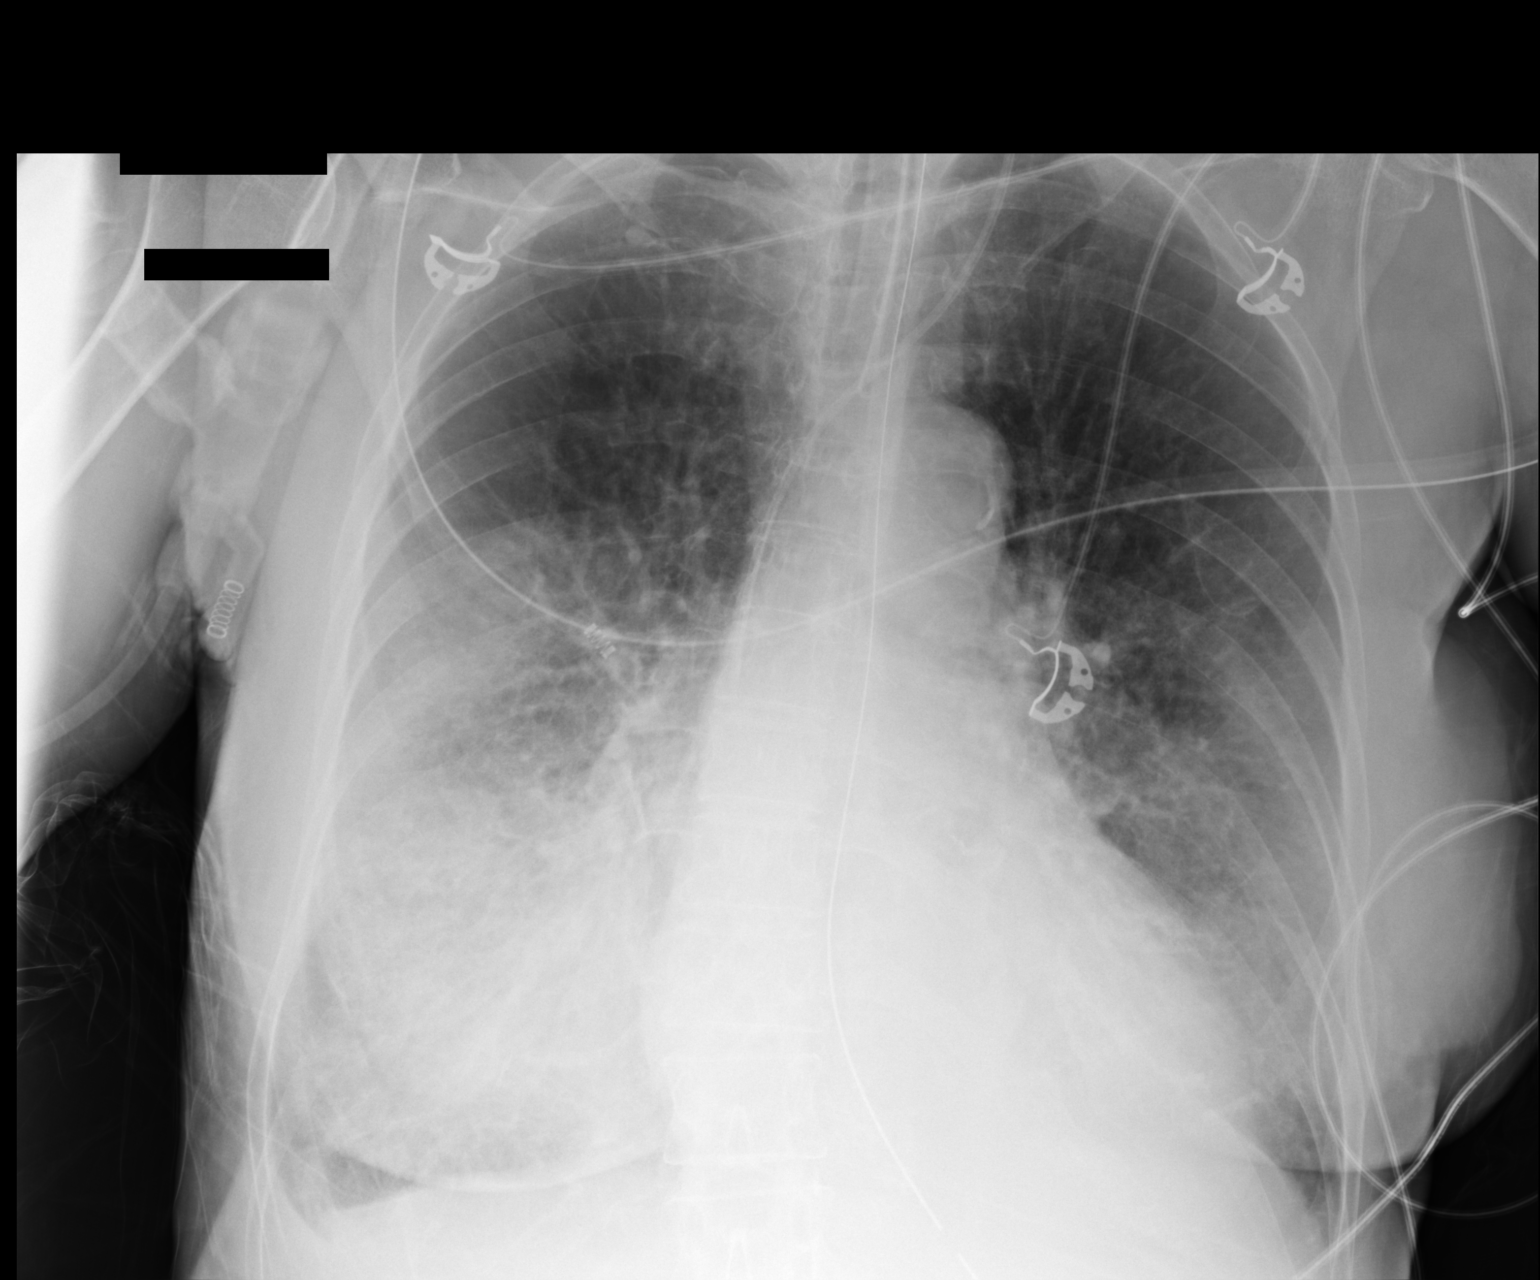

[1 of 1 positions shown; findings below may reference images not displayed]

FINDINGS: Cardiac shadow is stable. An endotracheal tube, nasogastric catheter
and left jugular central line are seen in satisfactory position.
Slight increase in the degree of infiltrative density in the right
mid and lower lung is noted. Slight improved aeration on the left is
seen.
IMPRESSION: Slight improved aeration in the left base.

Increasing right basilar infiltrate.

## 2017-01-28 ENCOUNTER — Ambulatory Visit (INDEPENDENT_AMBULATORY_CARE_PROVIDER_SITE_OTHER): Payer: Self-pay | Admitting: Orthopaedic Surgery

## 2017-01-30 ENCOUNTER — Ambulatory Visit (INDEPENDENT_AMBULATORY_CARE_PROVIDER_SITE_OTHER): Payer: Medicare Other | Admitting: Orthopaedic Surgery

## 2017-01-30 ENCOUNTER — Encounter (INDEPENDENT_AMBULATORY_CARE_PROVIDER_SITE_OTHER): Payer: Self-pay | Admitting: Orthopaedic Surgery

## 2017-01-30 ENCOUNTER — Ambulatory Visit (INDEPENDENT_AMBULATORY_CARE_PROVIDER_SITE_OTHER): Payer: Medicare Other

## 2017-01-30 VITALS — BP 180/108 | HR 61 | Resp 16 | Ht 65.0 in | Wt 133.0 lb

## 2017-01-30 DIAGNOSIS — S52124A Nondisplaced fracture of head of right radius, initial encounter for closed fracture: Secondary | ICD-10-CM

## 2017-01-30 DIAGNOSIS — S52134A Nondisplaced fracture of neck of right radius, initial encounter for closed fracture: Secondary | ICD-10-CM | POA: Diagnosis not present

## 2017-01-30 NOTE — Progress Notes (Signed)
Office Visit Note   Patient: Beverly Li           Date of Birth: 11-Apr-1946           MRN: [id number] Visit Date: 01/30/2017              Requested by: Clayborn Heron, MD [address] [place], [place] [address] PCP: Clayborn Heron, MD   Assessment & Plan: Visit Diagnoses:  1. Closed nondisplaced fracture of head of right radius, initial encounter   2. Closed nondisplaced fracture of neck of right radius, initial encounter     Plan: #1: We will place her into a removable long arm splint #2: She may remove this for gentle range of motion as well as bathing  Follow-Up Instructions: Return in about 2 weeks (around 02/13/2017).   Face-to-face time spent with patient was greater than 30 minutes.  Greater than 50% of the time was spent in counseling and coordination of care.  Orders:  Orders Placed This Encounter  Procedures  . Cast application  . XR Elbow Complete Right (3+View)   No orders of the defined types were placed in this encounter.     Procedures: Casting Date/Time: 01/30/2017 3:28 PM Performed by: Jacqualine Code D Authorized by: Jacqualine Code D   Consent Given by:  Patient Timeout: prior to procedure the correct patient, procedure, and site was verified   Location:  Elbow  right elbow Fracture Type: radial neck   Distal Perfusion: normal   Distal Sensation: normal   Range of Motion: reduced   Manipulation Performed?: No   Immobilization:  Cast (long-arm splint) Is this the patient's first cast for this injury?: No   Cast Type:  Long arm (posterior splint right elbow) Supplies Used:  Fiberglass Distal Perfusion: normal   Distal Sensation: normal   Range of Motion: unchanged   Patient tolerance:  Patient tolerated the procedure well with no immediate complications      Clinical Data: No additional findings.   Subjective: Chief Complaint  Patient presents with  . Right Elbow - Pain, Injury    Beverly Li is a 32 y o that  presents with a radial head fx on Saturday, October 19. She was touring homes in [place] when she fell on [address]estone roads. Beverly Li and her face hit and arm were injured.    Beverly Li is a very pleasant 71 year old white female who is seen today for evaluation of her right elbow. On Saturday, October 19 she was torn [place] in [place] when she was walking along a cobblestone road when she then fell striking her right arm as well as her right portion of her mouth.she was seen in the emergency room and had x-rays of the left forearm, left elbow, and chest. X-rays apparently revealed a fracture of the radial head. She is placed into a posterior splint.And was referred for orthopedic evaluation.  She did during the fall and strike the right portion of her upper lip and apparently had some laceration on the interior aspect which had to be sutured. Denies any other orthopedic pain.       Review of Systems  Constitutional: Negative for chills, fatigue and fever.  Eyes: Negative for itching.  Respiratory: Negative for chest tightness and shortness of breath.   Cardiovascular: Negative for chest pain, palpitations and leg swelling.  Gastrointestinal: Negative for blood in stool, constipation and diarrhea.  Endocrine: Negative for polyuria.  Genitourinary: Negative for dysuria.  Musculoskeletal: Negative for back pain,  joint swelling, neck pain and neck stiffness.  Allergic/Immunologic: Negative for immunocompromised state.  Neurological: Negative for dizziness and numbness.  Hematological: Does not bruise/bleed easily.  Psychiatric/Behavioral: The patient is not nervous/anxious.      Objective: Vital Signs: BP (!) 180/108   Pulse 61   Resp 16   Ht 5\' 5"  (1.651 m)   Wt 133 lb (60.3 kg)   LMP  (LMP Unknown)   BMI 22.13 kg/m   Physical Exam  Constitutional: She is oriented to person, place, and time. She appears well-developed and well-nourished.  HENT:  Head: Normocephalic and  atraumatic.  Eyes: Pupils are equal, round, and reactive to light. EOM are normal.  Neck: Neck supple.  Pulmonary/Chest: Effort normal.  Neurological: She is alert and oriented to person, place, and time.  Skin: Skin is warm and dry.  Psychiatric: She has a normal mood and affect. Her behavior is normal. Judgment and thought content normal.    Ortho Exam  Today she lacks full supination as well as flexion and extension. She ranges from about 45 to 120 of the motion of the elbow.She pronates very nicely.  upination though causes her pain and she does not go past the 0. No shoulder pain. No wrist pain.  Specialty Comments:  No specialty comments available.  Imaging: Xr Elbow Complete Right (3+view)  Result Date: 01/30/2017 Three-view x-ray of the right elbow reveals what appears to be a nondisplaced fracture of the radial neck with some extension of the radial head. She may have a little bit of depression on the lateral aspect.    PMFS History: Patient Active Problem List   Diagnosis Date Noted  . Vasovagal episode   . Hypotension 05/07/2016  . Coronary artery disease involving native coronary artery of native heart without angina pectoris   . Dyspnea 01/26/2016  . Chronic diastolic heart failure (HCC) 03/28/2015  . Paroxysmal atrial fibrillation with rapid ventricular response (HCC)   . Essential hypertension   . HCAP (healthcare-associated pneumonia)   . S/P femoral-popliteal bypass surgery   . Hypokalemia   . Hypophosphatemia   . 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  . Demand ischemia (HCC) 03/03/2015  . PAD (peripheral artery disease) (HCC) 03/02/2015  . Acute respiratory failure with hypoxia (HCC) 03/02/2015  . Encounter for central line placement   . Claudication (HCC) 01/31/2015  . Peripheral vascular disease (HCC) 01/31/2015  . 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 Hyperlipidemia 01/31/2015  . Atherosclerosis of native arteries of  extremity with intermittent claudication (HCC) 02/10/2014  . 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 Hypertension   . Atrial arrhythmia   . Mild aortic sclerosis (HCC)   . Syncope    Past Medical History:  Diagnosis Date  . Anxiety   . Arthritis   . Atrial fibrillation (HCC)   . Bell's palsy    when pt. was 71 yrs old, when under stress the left side of face will droop.  [name]. GERD (gastroesophageal reflux disease)   . History of kidney stones   . Hypertension   . Hypertrophic cardiomyopathy (HCC)    severe LV basilar hypertrophy witn no evidence of significant outflow tract obstruction, EF 65-70%, mild LAE, mild TR, grade 1a diastolic dysfunction 05/15/10 (Dr. Donato SchultzMark Skains) (Atrial Septal Hypertrophy pattern)-- Intra-op TEE with dsignificant outflow tract obstruction - AI, MR & TR  . Insomnia   . Mild aortic sclerosis (HCC)   . Osteopenia   . Peripheral vascular disease (HCC)   .  Syncope    , Vagal    Family History  Problem Relation Age of Onset  . Liver cancer Mother   . Cancer Mother        Liver  . Hypertension Mother   . Lung cancer Father   . Cancer Father        Lung  . Breast cancer Sister   . Cancer Sister        Breast    Past Surgical History:  Procedure Laterality Date  . BREAST ENHANCEMENT SURGERY Bilateral   . CARDIAC CATHETERIZATION N/A 05/07/2016   Procedure: Left Heart Cath and Coronary Angiography;  Surgeon: Kathleene Hazel, MD;  Location: [facility] INVASIVE CV LAB;  Service: Cardiovascular;  Laterality: N/A;  . ENDARTERECTOMY FEMORAL Right 03/02/2015   Procedure: ENDARTERECTOMY RIGHT FEMORAL;  Surgeon: Chuck Hint, MD;  [facility];  Service: Vascular;  Laterality: Right;  . FACIAL COSMETIC SURGERY    . FEMORAL-POPLITEAL BYPASS GRAFT Right 03/02/2015   Procedure: BYPASS GRAFT FEMORAL-BELOW KNEE POPLITEAL ARTERY;  Surgeon: Chuck Hint, MD;  Location: [facility] OR;  Service: Vascular;  Laterality: Right;  . HERNIA REPAIR     . INGUINAL HERNIA REPAIR Bilateral   . left ovarian cyst    . PERIPHERAL VASCULAR CATHETERIZATION N/A 01/16/2015   Procedure: Abdominal Aortogram;  Surgeon: Chuck Hint, MD;  Location: [facility] INVASIVE CV LAB;  Service: Cardiovascular;  Laterality: N/A;  . TONSILLECTOMY     Social History   Occupational History  . Not on file.   Social History Main Topics  . Smoking status: Former Smoker    Packs/day: 1.00    Years: 50.00    Types: Cigarettes    Quit date: 12/15/2014  . Smokeless tobacco: Never Used  . Alcohol use Yes     Comment: 1-2 per week  . Drug use: No  . Sexual activity: Not on file

## 2017-02-17 ENCOUNTER — Ambulatory Visit (INDEPENDENT_AMBULATORY_CARE_PROVIDER_SITE_OTHER): Payer: Medicare Other

## 2017-02-17 ENCOUNTER — Ambulatory Visit (INDEPENDENT_AMBULATORY_CARE_PROVIDER_SITE_OTHER): Payer: Medicare Other | Admitting: Orthopaedic Surgery

## 2017-02-17 ENCOUNTER — Encounter (INDEPENDENT_AMBULATORY_CARE_PROVIDER_SITE_OTHER): Payer: Self-pay | Admitting: Orthopaedic Surgery

## 2017-02-17 ENCOUNTER — Ambulatory Visit (INDEPENDENT_AMBULATORY_CARE_PROVIDER_SITE_OTHER): Payer: Self-pay

## 2017-02-17 VITALS — BP 144/86 | HR 54 | Resp 14 | Ht 65.0 in | Wt 133.0 lb

## 2017-02-17 DIAGNOSIS — S52124D Nondisplaced fracture of head of right radius, subsequent encounter for closed fracture with routine healing: Secondary | ICD-10-CM | POA: Diagnosis not present

## 2017-02-17 NOTE — Progress Notes (Signed)
Office Visit Note   Patient: Beverly Li           Date of Birth: Feb 18, 1946           MRN: [id number] Visit Date: 02/17/2017              Requested by: Clayborn Heron, MD [address] [place], [place] [address] PCP: Clayborn Heron, MD   Assessment & Plan: Visit Diagnoses:  1. Closed nondisplaced fracture of head of right radius with routine healing, subsequent encounter     Plan: 17 days status post nondisplaced right radial head fracture.doing well. No significant pain.remove splint in 1 week and return to the office in 2 weeks  Follow-Up Instructions: Return in about 2 weeks (around 03/03/2017).   Orders:  Orders Placed This Encounter  Procedures  . XR Elbow 2 Views Right   No orders of the defined types were placed in this encounter.     Procedures: No procedures performed   Clinical Data: No additional findings.   Subjective: Chief Complaint  Patient presents with  . Right Forearm - Follow-up    Beverly Li is a 71 y o S/P 3 weeks Closed nondisplaced fracture of head of right radius. She relates she has had no pain, no swelling and can flex hand but not press down.  7 days status post nondisplaced fracture of right radial head. Doing well in posterior splint and sling. No significant pain. No numbness or tingling.  HPI  Review of Systems  Constitutional: Negative for chills, fatigue and fever.  Eyes: Negative for itching.  Respiratory: Negative for chest tightness and shortness of breath.   Cardiovascular: Negative for chest pain, palpitations and leg swelling.  Gastrointestinal: Negative for blood in stool, constipation and diarrhea.  Endocrine: Negative for polyuria.  Genitourinary: Negative for dysuria.  Musculoskeletal: Negative for back pain, joint swelling, neck pain and neck stiffness.  Allergic/Immunologic: Negative for immunocompromised state.  Neurological: Positive for weakness. Negative for dizziness and numbness.    Hematological: Does not bruise/bleed easily.  Psychiatric/Behavioral: The patient is not nervous/anxious.      Objective: Vital Signs: BP (!) 144/86   Pulse (!) 54   Resp 14   Ht 5\' 5"  (1.651 m)   Wt 133 lb (60.3 kg)   LMP  (LMP Unknown)   BMI 22.13 kg/m   Physical Exam  Ortho Exam awake alert and oriented 3. Comfortable sitting. Full flexion and almost full extension right elbow. Minimal tenderness over the radial head. Full pronation. Lacks a few degrees to full supination. Skin intact. Neurovascular exam intact Specialty Comments:  No specialty comments available.  Imaging: Xr Elbow 2 Views Right  Result Date: 02/17/2017 Films of the right elbow obtained in the AP lateral projection. The previously identified radial head fracture remains in anatomic position.    PMFS History: Patient Active Problem List   Diagnosis Date Noted  . Vasovagal episode   . Hypotension 05/07/2016  . Coronary artery disease involving native coronary artery of native heart without angina pectoris   . Dyspnea 01/26/2016  . Chronic diastolic heart failure (HCC) 03/28/2015  . Paroxysmal atrial fibrillation with rapid ventricular response (HCC)   . Essential hypertension   . HCAP (healthcare-associated pneumonia)   . S/P femoral-popliteal bypass surgery   . Hypokalemia   . Hypophosphatemia   . 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  . Demand ischemia (HCC) 03/03/2015  . PAD (peripheral artery disease) (HCC) 03/02/2015  . Acute respiratory failure  with hypoxia (HCC) 03/02/2015  . Encounter for central line placement   . Claudication (HCC) 01/31/2015  . Peripheral vascular disease (HCC) 01/31/2015  . 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 Hyperlipidemia 01/31/2015  . Atherosclerosis of native arteries of extremity with intermittent claudication (HCC) 02/10/2014  . 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 Hypertension   . Atrial arrhythmia   .  Mild aortic sclerosis (HCC)   . Syncope    Past Medical History:  Diagnosis Date  . Anxiety   . Arthritis   . Atrial fibrillation (HCC)   . Bell's palsy    when pt. was 71 yrs old, when under stress the left side of face will droop.  [name]. GERD (gastroesophageal reflux disease)   . History of kidney stones   . Hypertension   . Hypertrophic cardiomyopathy (HCC)    severe LV basilar hypertrophy witn no evidence of significant outflow tract obstruction, EF 65-70%, mild LAE, mild TR, grade 1a diastolic dysfunction 05/15/10 (Dr. Donato SchultzMark Skains) (Atrial Septal Hypertrophy pattern)-- Intra-op TEE with dsignificant outflow tract obstruction - AI, MR & TR  . Insomnia   . Mild aortic sclerosis (HCC)   . Osteopenia   . Peripheral vascular disease (HCC)   . Syncope    , Vagal    Family History  Problem Relation Age of Onset  . Liver cancer Mother   . Cancer Mother        Liver  . Hypertension Mother   . Lung cancer Father   . Cancer Father        Lung  . Breast cancer Sister   . Cancer Sister        Breast    Past Surgical History:  Procedure Laterality Date  . BREAST ENHANCEMENT SURGERY Bilateral   . FACIAL COSMETIC SURGERY    . HERNIA REPAIR    . INGUINAL HERNIA REPAIR Bilateral   . left ovarian cyst    . TONSILLECTOMY     Social History   Occupational History  . Not on file  Tobacco Use  . Smoking status: Former Smoker    Packs/day: 1.00    Years: 50.00    Pack years: 50.00    Types: Cigarettes    Last attempt to quit: 12/15/2014    Years since quitting: 2.1  . Smokeless tobacco: Never Used  Substance and Sexual Activity  . Alcohol use: Yes    Comment: 1-2 per week  . Drug use: No  . Sexual activity: Not on file

## 2017-03-03 ENCOUNTER — Encounter (INDEPENDENT_AMBULATORY_CARE_PROVIDER_SITE_OTHER): Payer: Self-pay | Admitting: Orthopaedic Surgery

## 2017-03-03 ENCOUNTER — Ambulatory Visit (INDEPENDENT_AMBULATORY_CARE_PROVIDER_SITE_OTHER): Payer: Medicare Other | Admitting: Orthopaedic Surgery

## 2017-03-03 DIAGNOSIS — S52124D Nondisplaced fracture of head of right radius, subsequent encounter for closed fracture with routine healing: Secondary | ICD-10-CM | POA: Diagnosis not present

## 2017-03-03 DIAGNOSIS — S52126D Nondisplaced fracture of head of unspecified radius, subsequent encounter for closed fracture with routine healing: Secondary | ICD-10-CM | POA: Insufficient documentation

## 2017-03-03 NOTE — Progress Notes (Signed)
Office Visit Note   Patient: Beverly Li           Date of Birth: [birth year]           MRN: [id number] Visit Date: 03/03/2017              Requested by: Clayborn Heronankins, Victoria R, MD [address] [place], [place] [address] PCP: Clayborn Heronankins, Victoria R, MD   Assessment & Plan: Visit Diagnoses:  1. Closed nondisplaced fracture of head of right radius with routine healing, subsequent encounter     Plan: One month status post nondisplaced right radial head fracture. Doing well.. No further mobilization. Strengthening exercises. Office as needed Follow-Up Instructions: Return if symptoms worsen or fail to improve.   Orders:  No orders of the defined types were placed in this encounter.  No orders of the defined types were placed in this encounter.     Procedures: No procedures performed   Clinical Data: No additional findings.   Subjective: No chief complaint on file. 1 month status post nondisplaced right radial head fracture and doing very well. No related pain. No numbness or tingling.  HPI  Review of Systems   Objective: Vital Signs: LMP  (LMP Unknown)   Physical Exam  Ortho Exam awake alert and oriented 3. Comfortable sitting. Full range of motion of right elbow and pronation supination flexion and extension. Good grip and release. Neurovascular exam intact. No local tenderness over the right radial head  No specialty comments available.  Imaging: No results found.   PMFS History: Patient Active Problem List   Diagnosis Date Noted  . Closed nondisplaced fracture of head of radius with routine healing 03/03/2017  . Vasovagal episode   . Hypotension 05/07/2016  . Coronary artery disease involving native coronary artery of native heart without angina pectoris   . Dyspnea 01/26/2016  . Chronic diastolic heart failure (HCC) 03/28/2015  . Paroxysmal atrial fibrillation with rapid ventricular response (HCC)   . Essential hypertension   . HCAP  (healthcare-associated pneumonia)   . S/P femoral-popliteal bypass surgery   . Hypokalemia   . Hypophosphatemia   . 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  . Demand ischemia (HCC) 03/03/2015  . PAD (peripheral artery disease) (HCC) 03/02/2015  . Acute respiratory failure with hypoxia (HCC) 03/02/2015  . Encounter for central line placement   . Claudication (HCC) 01/31/2015  . Peripheral vascular disease (HCC) 01/31/2015  . 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 Hyperlipidemia 01/31/2015  . Atherosclerosis of native arteries of extremity with intermittent claudication (HCC) 02/10/2014  . 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 Hypertension   . Atrial arrhythmia   . Mild aortic sclerosis (HCC)   . Syncope    Past Medical History:  Diagnosis Date  . Anxiety   . Arthritis   . Atrial fibrillation (HCC)   . Bell's palsy    when pt. was 71 yrs old, when under stress the left side of face will droop.  [name]. GERD (gastroesophageal reflux disease)   . History of kidney stones   . Hypertension   . Hypertrophic cardiomyopathy (HCC)    severe LV basilar hypertrophy witn no evidence of significant outflow tract obstruction, EF 65-70%, mild LAE, mild TR, grade 1a diastolic dysfunction 05/15/10 (Dr. Donato SchultzMark Skains) (Atrial Septal Hypertrophy pattern)-- Intra-op TEE with dsignificant outflow tract obstruction - AI, MR & TR  . Insomnia   . Mild aortic sclerosis (HCC)   . Osteopenia   . Peripheral vascular disease (  HCC)   . Syncope    , Vagal    Family History  Problem Relation Age of Onset  . Liver cancer Mother   . Cancer Mother        Liver  . Hypertension Mother   . Lung cancer Father   . Cancer Father        Lung  . Breast cancer Sister   . Cancer Sister        Breast    Past Surgical History:  Procedure Laterality Date  . Abdominal Aortogram N/A 01/16/2015   [facility] Chuck H[facility]ickson[facility]  . BREAST  ENHANCEMENT SURGERY Bilateral   . BYPASS GRAFT FEMORAL-BELOW KNEE POPLITEAL ARTERY Right 03/02/2015   [facility]icks[facility]  . ENDARTERECTOMY RIGHT FEMORAL Right 03/02/2015   [facility] Chuck [facility]ickson[facility]  . FACIAL COSMETIC SURGERY    . HERNIA REPAIR    . INGUINAL HERNIA REPAIR Bilateral   . Left Heart Cath and Coronary Angiography N/A 05/07/2016   Performed by Kathleene HazelMcAlhany, Christopher D, MD [facility]  . left ovarian cyst    . POSTERIOR LUMBAR INTERBODY FUSION POSTERIOR LATERAL ARTHRODESIS AND POSTERIOR NONSEGMENTAL INSTRUMENTATION LUMBAR FOUR-FIVE Bilateral 07/07/2013   Performed by Hewitt ShortsNudelman, Robert W, [facility]  . TONSILLECTOMY     Social History   Occupational History  . Not on file  Tobacco Use  . Smoking status: Former Smoker    Packs/day: 1.00    Years: 50.00    Pack years: 50.00    Types: Cigarettes    Last attempt to quit: 12/15/2014    Years since quitting: 2.2  . Smokeless tobacco: Never Used  Substance and Sexual Activity  . Alcohol use: Yes    Comment: 1-2 per week  . Drug use: No  . Sexual activity: Not on file     Valeria BatmanPeter W Charnetta Wulff, MD   Note - This record has been created using [employer] software.  Chart creation errors have been sought, but may not always  have been located. Such creation errors do not reflect on  the standard of medical care.

## 2017-03-26 ENCOUNTER — Ambulatory Visit: Payer: Medicare Other | Admitting: Cardiology

## 2017-03-31 ENCOUNTER — Ambulatory Visit: Payer: Medicare Other | Admitting: Cardiology

## 2017-03-31 ENCOUNTER — Encounter: Payer: Self-pay | Admitting: Cardiology

## 2017-03-31 VITALS — BP 148/104 | HR 69 | Ht 65.0 in | Wt 133.2 lb

## 2017-03-31 DIAGNOSIS — I739 Peripheral vascular disease, unspecified: Secondary | ICD-10-CM

## 2017-03-31 DIAGNOSIS — G909 Disorder of the autonomic nervous system, unspecified: Secondary | ICD-10-CM | POA: Diagnosis not present

## 2017-03-31 DIAGNOSIS — I421 Obstructive hypertrophic cardiomyopathy: Secondary | ICD-10-CM

## 2017-03-31 NOTE — Patient Instructions (Signed)

## 2017-03-31 NOTE — Progress Notes (Signed)
Cardiology Office Note   Date:  03/31/2017   ID:  Beverly Li, DOB 01/25/1946, MRN [id number]  PCP:  Clayborn Heronankins, Victoria R, MD  Cardiologist:   Donato SchultzMark Skains, MD / Graciela HusbandsKlein       History of Present Illness: Beverly Li is a 71 y.o. female  with hypertrophic obstructive cardiomyopathy, recent severe exacerbation again post cardiac catheterization and prior during femoropopliteal bypass resulting in  phenylephrine , fluids and prolonged intubation here for follow-up.  She underwent diagnostic heart catheterization on 05/07/16 and post procedure developed severe hypotension, hyper vagal response. Requiring once again phenylephrine, facemask oxygen. She did not require intubation. 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 Hydralazine was given post catheterization wound systolic pressure pressure was 190. Profound hypotension and bradycardia noted. This was treated rapidly, fluids, phenylephrine, heart rates as low as the 40s.  Midodrine started. Phenylephrine eventually weaned off. Diuresis noted. Klonopin was started for anxiety, to hopefully help blunt the hyper adrenergic response. She denies being atypically anxious person however.  Echocardiogram 04/2016 showed EF 65-70% with hypertrophic obstructive cardiomyopathy with LVOT gradient of 23 mm at rest.  She is here for follow-up today and should have appointment set up with Dr. Graciela HusbandsKlein to discuss her profound autonomic dysfunction.  In 2014 started having "heart burn" episodes. Debilitating. Out walking dog. By time she would get back, sweat, diahpesis, up neck, nausea then diarrhea. Wiped out for a few hours. Does not feel it now with exercise. Felt 3 hours irreg hb afib kicked in. Afaid to go anywhere. The sweat comes over her hot. Pain. After she described this once again, this is why I proceeded with cardiac catheterization to make sure that with her peripheral vascular disease that she did not have severe  coronary artery disease that was now becoming progressive. Thankfully, she did not have flow limiting disease. Moderate LAD, FFR.  Shortly after cardiac catheterization when her blood pressure increased to the 190 systolic range, she was given hydralazine to reduce her blood pressure and she ended up having a hyper vagal response where her heart rate decreases the 40s, blood pressure plummeted into the 50 systolic, she developed flash pulmonary edema. She was placed in the CCU, phenylephrine. Slowly, this was able to be weaned. Klonopin and Midodrin was started.  This episode post catheterization was very similar to her episode intraoperatively during her femoropopliteal bypass. She was intubated for several days after that procedure and with her arterial line demonstrated classic spike and dome changes intermittently with marked variations in her blood pressure at various points.  09/16/16-since her last visit she has had cardiac MRI and stress echocardiogram. Both were reassuring. She did not drop her blood pressures during exercise. She did not increase her gradient significantly during exercise. We were able to invoke an increase left ventricular output tract gradient with Valsalva maneuver. Please see report. No ventricular arrhythmias. She states that over the last 3 months she's felt the best that she's felt in quite some time. She is drinking sports drinks quite frequently, propel. When she does have low blood pressure usually has a prodrome, very vagal-like response as noted previously.  03/31/17-she had a fall in [place] when walking on cobblestones in a garden with flip-flops during a house store with her girlfriends.  She states that she broke her fall with her lips.  She also broke her arm.  Dr. Cleophas DunkerWhitfield.  She has not had any severe cardiac symptoms thankfully.  At  one point during the last several months she did have low blood pressure and she took the Midrin which took effect after  approximately 3 hours.  She has been drinking propel.  Doing very well.  Past Medical History:  Diagnosis Date  . Anxiety   . Arthritis   . Atrial fibrillation (HCC)   . Bell's palsy    when pt. was 71 yrs old, when under stress the left side of face will droop.  [name] GERD (gastroesophageal reflux disease)   . History of kidney stones   . Hypertension   . Hypertrophic cardiomyopathy (HCC)    severe LV basilar hypertrophy witn no evidence of significant outflow tract obstruction, EF 65-70%, mild LAE, mild TR, grade 1a diastolic dysfunction 05/15/10 (Dr. Donato Schultz) (Atrial Septal Hypertrophy pattern)-- Intra-op TEE with dsignificant outflow tract obstruction - AI, MR & TR  . Insomnia   . Mild aortic sclerosis (HCC)   . Osteopenia   . Peripheral vascular disease (HCC)   . Syncope    , Vagal    Past Surgical History:  Procedure Laterality Date  . BREAST ENHANCEMENT SURGERY Bilateral   . CARDIAC CATHETERIZATION N/A 05/07/2016   Procedure: Left Heart Cath and Coronary Angiography;  Surgeon: Kathleene Hazel, MD;  Location: [facility] INVASIVE CV LAB;  Service: Cardiovascular;  Laterality: N/A;  . ENDARTERECTOMY FEMORAL Right 03/02/2015   Procedure: ENDARTERECTOMY RIGHT FEMORAL;  Surgeon: Chuck Hint, MD;  Location: [facility] OR;  Service: Vascular;  Laterality: Right;  . FACIAL COSMETIC SURGERY    . FEMORAL-POPLITEAL BYPASS GRAFT Right 03/02/2015   Procedure: BYPASS GRAFT FEMORAL-BELOW KNEE POPLITEAL ARTERY;  Surgeon: Chuck Hint, MD;  Location: [facility] OR;  Service: Vascular;  Laterality: Right;  . HERNIA REPAIR    . INGUINAL HERNIA REPAIR Bilateral   . left ovarian cyst    . PERIPHERAL VASCULAR CATHETERIZATION N/A 01/16/2015   Procedure: Abdominal Aortogram;  Surgeon: Chuck Hint, MD;  Location: [facility] INVASIVE CV LAB;  Service: Cardiovascular;  Laterality: N/A;  . TONSILLECTOMY       Current Outpatient Medications  Medication Sig Dispense Refill  . alendronate  (FOSAMAX) 70 MG tablet TK 1 T PO ONCE A WEEK  3  . aspirin 81 MG tablet Take 81 mg by mouth daily.    [name] buPROPion (WELLBUTRIN XL) 150 MG 24 hr tablet Take 150 mg by mouth daily.    [name] DEXILANT 30 MG capsule Take 1 capsule by mouth daily.    [name] ELIQUIS 5 MG TABS tablet TAKE 1 TABLET(5 MG) BY MOUTH TWICE DAILY 60 tablet 5  . ezetimibe (ZETIA) 10 MG tablet Take 1 tablet (10 mg total) by mouth daily. 30 tablet 11  . hydrALAZINE (APRESOLINE) 25 MG tablet TAKE 1 TABLET BY MOUTH EVERY 8 HOURS AS NEEDED FOR SYSTOLIC BLOOD PRESSURE(TOP NUMBER)>160 270 tablet 0  . midodrine (PROAMATINE) 10 MG tablet Take 1/2 tablet (5 mg) three times a day as needed for systolic blood pressure (top number) < 100.    . Multiple Vitamins-Iron (STRESS B COMPLEX/IRON) TABS Take 1 tablet by mouth daily. Reported on 04/05/2015    . nebivolol (BYSTOLIC) 10 MG tablet Take 5 mg by mouth at bedtime. Takes 1/2 tablet (5 mg) once daily    . PROAIR HFA 108 (90 Base) MCG/ACT inhaler Inhale 2 puffs into the lungs 4 (four) times daily as needed.  1  . rosuvastatin (CRESTOR) 5 MG tablet Patient takes 2.5mg  by mouth on Monday and Tuesday.    [name]  zolpidem (AMBIEN) 5 MG tablet Take 5 mg by mouth at bedtime as needed for sleep.      No current facility-administered medications for this visit.     Allergies:   Amoxicillin; Atenolol; Crestor [rosuvastatin calcium]; Pravastatin; Sulfa antibiotics; and Codeine    Social History:  The patient  reports that she quit smoking about 2 years ago. Her smoking use included cigarettes. She has a 50.00 pack-year smoking history. she has never used smokeless tobacco. She reports that she drinks alcohol. She reports that she does not use drugs.   Family History:  The patient's family history includes Breast cancer in her sister; Cancer in her father, mother, and sister; Hypertension in her mother; Liver cancer in her mother; Lung cancer in her father.    ROS:  Please see the history of present illness.  All  others negative.   PHYSICAL EXAM: VS:  BP (!) 148/104   Pulse 69   Ht 5\' 5"  (1.651 m)   Wt 133 lb 3.2 oz (60.4 kg)   LMP  (LMP Unknown)   SpO2 94%   BMI 22.17 kg/m  , BMI Body mass index is 22.17 kg/m. GEN: Well nourished, well developed, in no acute distress  HEENT: normal  Neck: no JVD, carotid bruits, or masses Cardiac: RRR; 2/6 SM, no rubs, or gallops,no edema  Respiratory:  clear to auscultation bilaterally, normal work of breathing GI: soft, nontender, nondistended, + BS MS: no deformity or atrophy  Skin: warm and dry, no rash Neuro:  Alert and Oriented x 3, Strength and sensation are intact Psych: euthymic mood, full affect    EKG:  EKG ordered today 04/22/16-sinus bradycardia rate 58 with biatrial enlargement, T-wave inversion noted in 1, aVL, subtle downsloping noted in V6. Note hypertrophic cardiomyopathy. Prior, sinus brady with T-wave changes same as current. Personally viewed.   Recent Labs: 05/08/2016: ALT 19 05/09/2016: Hemoglobin 14.5; Magnesium 1.9; Platelets 220 05/12/2016: BUN 26; Potassium 4.1; Sodium 143 07/18/2016: Creatinine, Ser 0.70     Lipid Panel    Component Value Date/Time   CHOL 211 (H) 09/16/2016 1200   TRIG 78 09/16/2016 1200   HDL 97 09/16/2016 1200   CHOLHDL 2.2 09/16/2016 1200   LDLCALC 98 09/16/2016 1200  LDL last checked 03/19/17 108    Wt Readings from Last 3 Encounters:  03/31/17 133 lb 3.2 oz (60.4 kg)  02/17/17 133 lb (60.3 kg)  01/30/17 133 lb (60.3 kg)      Other studies Reviewed: Additional studies/ records that were reviewed today include:  Hospital records reviewed, echocardiogram reviewed. Review of the above records demonstrates:  As above   ASSESSMENT AND PLAN:  71 year old female with hypertrophic obstructive cardiomyopathy, moderate coronary artery disease, nonflow limiting LAD lesion by FFR, peripheral vascular disease requiring femoropopliteal bypass, long-standing history of hyper vagal responses.     Hypertrophic obstructive cardiomyopathy/Dyspnea on exertion  -  Avoid dehydration  -  At baseline, outflow tract is not significant  -  Intraoperative. With crossclamping, vasodilatation,   Increased adrenergic drive ,Perfect storm to elucidate obstruction.She was intubated for several days postoperatively femoropopliteal and hemodynamically post significant troubles trying to extubate given her periodic outflow tract obstruction which was severe.  -  We will try to avoid all elective surgical procedures in the future.  -  No high-risk symptoms such as ventricular tachycardia, early cardiac death in family members , unexplained syncope , septum less than 3 cm. Murmur has been present at baseline.   -  Low-dose  Bystolic (decreased heart rate , decreased inotropic beneficial in her situation).   - She describes hypervagal-like experiences including nausea, vomiting, pallor, diaphoresis with subsequent diarrhea, feeling wiped out for hours. She has been battling this for years. Continue to avoid dehydration. She is drinking propel. She knows to lay down immediately if she begins to feel this.  - Now on midodrine PRN.   - Thankfully the stress echocardiogram did not show any drops in blood pressure with exercise. She did not show any increased gradient during exercise on echocardiogram. She did however when performing Valsalva increase her gradient. Please see report. No ventricular arrhythmias.  - MRI did not show any evidence of SAM, minimal gradient.  -Currently remaining stable, no syncope.  Rare Midodrin use.   -Dyspnea-Prior extensive smoking history- PFTs/pulmonary evaluation, Referred to pulmonary previously. Chest x-ray PA and lateral from 09/19/12 was interpreted as underlying COPD.   Sinus bradycardia  -  Stable, no symptoms. Continue with Bystolic 5  - She takes hydralazine if her blood pressure gets over 160 occasionally. She takes midodrine if it drops significantly but she has not taken  this recently.    Paroxysmal atrial fibrillation  -  Continuing with anticoagulation, Eliquis  -  No longer on amiodarone.  -  If symptoms arise/worsen, may need to resume antiarrhythmic therapy as HOCM patients do not tolerate atrial fibrillation well.  -  Postoperative  Anxiety  - would be ok to use low dose Xanax. Dr. Barbaraann Barthel.  This was previously discussed with Dr. Gala Romney.   Statin intolerance  -  Did not tolerate Crestor well. When raising her hands above her head her shoulders ached. Since she is stopped, she feels better. Given her peripheral vascular disease we would like her on statin therapy. She wonders if her hip pain or bursitis with statin related. However, she was holding her grandchild on that hip for quite some time.  -  Did not tolerate pravastatin 20 mg once a day either. Her legs bother her significant only.  - Appreciate help with lipid clinic for further discussion, Zetia may be option.   Disposition:   FU with Skains in 6 months.  Signed, Donato Schultz, MD  03/31/2017 9:43 AM    [facility] [address] [place], [place], [place]  [address] Phone: [phone]; Fax: [phone]

## 2017-05-14 ENCOUNTER — Other Ambulatory Visit (HOSPITAL_COMMUNITY): Payer: Medicare Other

## 2017-05-14 ENCOUNTER — Encounter (HOSPITAL_COMMUNITY): Payer: Medicare Other

## 2017-05-14 ENCOUNTER — Ambulatory Visit: Payer: Medicare Other | Admitting: Family

## 2017-05-19 ENCOUNTER — Ambulatory Visit (HOSPITAL_COMMUNITY)
Admission: RE | Admit: 2017-05-19 | Discharge: 2017-05-19 | Disposition: A | Discharge status: Home / Self Care | Payer: Medicare Other | Source: Ambulatory Visit | Attending: Family | Admitting: Family

## 2017-05-19 ENCOUNTER — Ambulatory Visit (INDEPENDENT_AMBULATORY_CARE_PROVIDER_SITE_OTHER)
Admission: RE | Admit: 2017-05-19 | Discharge: 2017-05-19 | Disposition: A | Discharge status: Home / Self Care | Payer: Medicare Other | Source: Ambulatory Visit | Attending: Family | Admitting: Family

## 2017-05-19 ENCOUNTER — Encounter: Payer: Self-pay | Admitting: Family

## 2017-05-19 ENCOUNTER — Ambulatory Visit: Payer: Medicare Other | Admitting: Family

## 2017-05-19 VITALS — BP 165/96 | HR 54 | Temp 98.3°F | Resp 18 | Ht 65.0 in | Wt 132.6 lb

## 2017-05-19 DIAGNOSIS — I10 Essential (primary) hypertension: Secondary | ICD-10-CM | POA: Diagnosis not present

## 2017-05-19 DIAGNOSIS — Z95828 Presence of other vascular implants and grafts: Secondary | ICD-10-CM | POA: Insufficient documentation

## 2017-05-19 DIAGNOSIS — Z87891 Personal history of nicotine dependence: Secondary | ICD-10-CM | POA: Insufficient documentation

## 2017-05-19 DIAGNOSIS — I779 Disorder of arteries and arterioles, unspecified: Secondary | ICD-10-CM | POA: Diagnosis not present

## 2017-05-19 DIAGNOSIS — R0989 Other specified symptoms and signs involving the circulatory and respiratory systems: Secondary | ICD-10-CM

## 2017-05-19 NOTE — Progress Notes (Signed)
[facility] [place]   CC: Follow up peripheral artery occlusive disease  History of Present Illness Beverly Li is a 72 y.o. female who presents for follow up ABI's and by bypass duplex s/p right fem to below knee popliteal by pass with composite vein, 2 pieces of right GSV spliced together, on 03-02-15 by Dr. Edilia Bo.   She has continued to exercise daily and is eating a healthy diet.   She was having such severe hip pain bilaterally she could barely walk. Once the statin drugs were stopped her hip pain resolved. She continues to take a daily aspirin, beta blocker and Eliquis.   Dr. Edilia Bo last evaluated pt on 02-21-16. At that time pt was very happy with her surgical out come. She did not want to continue to try statin medication due to an intolerance, stated her total cholesterol was <200. We recommend good eating habits with a plant and fruit based intake.  She was to f/u in 6 months for repeat ABI's and right by pass arterial duplex.  After walking about 15 minutes, her left calf hurts a little, relieved by 15 seconds rest; this started about December 2018. She had no claudication at her visit in July 2018.  She denies any claudication sx's in her right leg with walking.  She walks 30 minutes daily on a treadmill.  She denies any known history of stroke or TIA.   She had Bell's Palsy years ago and has had left side facial droop due to this.   Pt Diabetic: No Pt smoker: former smoker, quit on 12-15-14 after 50 years smoking  Pt meds include: Statin :low dose Crestor 2 days/week and muscles still ache but not as much as when she takes a daily statin Betablocker: Yes ASA: Yes Other anticoagulants/antiplatelets: Eliquis    Past Medical History:  Diagnosis Date  . Anxiety   . Arthritis   . Atrial fibrillation (HCC)   . Bell's palsy    when pt. was 72 yrs old, when under stress the left side of face will droop.  [name] GERD (gastroesophageal  reflux disease)   . History of kidney stones   . Hypertension   . Hypertrophic cardiomyopathy (HCC)    severe LV basilar hypertrophy witn no evidence of significant outflow tract obstruction, EF 65-70%, mild LAE, mild TR, grade 1a diastolic dysfunction 05/15/10 (Dr. Donato Schultz) (Atrial Septal Hypertrophy pattern)-- Intra-op TEE with dsignificant outflow tract obstruction - AI, MR & TR  . Insomnia   . Mild aortic sclerosis (HCC)   . Osteopenia   . Peripheral vascular disease (HCC)   . Syncope    , Vagal    Social History Social History   Tobacco Use  . Smoking status: Former Smoker    Packs/day: 1.00    Years: 50.00    Pack years: 50.00    Types: Cigarettes    Last attempt to quit: 12/15/2014    Years since quitting: 2.4  . Smokeless tobacco: Never Used  Substance Use Topics  . Alcohol use: Yes    Comment: 1-2 per week  . Drug use: No    Family History Family History  Problem Relation Age of Onset  . Liver cancer Mother   . Cancer Mother        Liver  . Hypertension Mother   . Lung cancer Father   . Cancer Father        Lung  . Breast cancer Sister   . Cancer Sister  Breast    Past Surgical History:  Procedure Laterality Date  . BREAST ENHANCEMENT SURGERY Bilateral   . CARDIAC CATHETERIZATION N/A 05/07/2016   Procedure: Left Heart Cath and Coronary Angiography;  Surgeon: Kathleene Hazelhristopher D McAlhany, MD;  Location: [facility] INVASIVE CV LAB;  Service: Cardiovascular;  Laterality: N/A;  . ENDARTERECTOMY FEMORAL Right 03/02/2015   Procedure: ENDARTERECTOMY RIGHT FEMORAL;  Surgeon: Chuck Hinthristopher S Dickson, MD;  [facility];  Service: Vascular;  Laterality: Right;  . FACIAL COSMETIC SURGERY    . FEMORAL-POPLITEAL BYPASS GRAFT Right 03/02/2015   Procedure: BYPASS GRAFT FEMORAL-BELOW KNEE POPLITEAL ARTERY;  Surgeon: Chuck Hinthristopher S Dickson, MD;  [facility];  Service: Vascular;  Laterality: Right;  . HERNIA REPAIR    . INGUINAL HERNIA REPAIR Bilateral   . left ovarian  cyst    . PERIPHERAL VASCULAR CATHETERIZATION N/A 01/16/2015   Procedure: Abdominal Aortogram;  Surgeon: Chuck Hinthristopher S Dickson, MD;  [facility] LAB;  Service: Cardiovascular;  Laterality: N/A;  . TONSILLECTOMY      Allergies  Allergen Reactions  . Amoxicillin Other (See Comments)    UTI  . Atenolol Cough  . Crestor [Rosuvastatin Calcium]     Muscle aches  . Pravastatin     Muscle aches  . Sulfa Antibiotics Nausea Only  . Codeine Rash    Current Outpatient Medications  Medication Sig Dispense Refill  . alendronate (FOSAMAX) 70 MG tablet TK 1 T PO ONCE A WEEK  3  . aspirin 81 MG tablet Take 81 mg by mouth daily.    [name]. buPROPion (WELLBUTRIN XL) 150 MG 24 hr tablet Take 150 mg by mouth daily.    [name]. DEXILANT 30 MG capsule Take 1 capsule by mouth daily.    [name]. ELIQUIS 5 MG TABS tablet TAKE 1 TABLET(5 MG) BY MOUTH TWICE DAILY 60 tablet 5  . hydrALAZINE (APRESOLINE) 25 MG tablet TAKE 1 TABLET BY MOUTH EVERY 8 HOURS AS NEEDED FOR SYSTOLIC BLOOD PRESSURE(TOP NUMBER)>160 270 tablet 0  . midodrine (PROAMATINE) 10 MG tablet Take 1/2 tablet (5 mg) three times a day as needed for systolic blood pressure (top number) < 100.    . Multiple Vitamins-Iron (STRESS B COMPLEX/IRON) TABS Take 1 tablet by mouth daily. Reported on 04/05/2015    . nebivolol (BYSTOLIC) 10 MG tablet Take 5 mg by mouth at bedtime. Takes 1/2 tablet (5 mg) once daily    . PROAIR HFA 108 (90 Base) MCG/ACT inhaler Inhale 2 puffs into the lungs 4 (four) times daily as needed.  1  . rosuvastatin (CRESTOR) 5 MG tablet Patient takes 2.5mg  by mouth on Monday and Tuesday.    . zolpidem (AMBIEN) 5 MG tablet Take 5 mg by mouth at bedtime as needed for sleep.     [name]. ezetimibe (ZETIA) 10 MG tablet Take 1 tablet (10 mg total) by mouth daily. 30 tablet 11   No current facility-administered medications for this visit.     ROS: See HPI for pertinent positives and negatives.   Physical Examination  Vitals:   05/19/17 1353  05/19/17 1355  BP: (!) 156/89 (!) 165/96  Pulse: (!) 54   Resp: 18   Temp: 98.3 F (36.8 C)   TempSrc: Oral   SpO2: 95%   Weight: 132 lb 9.6 oz (60.1 kg)   Height: 5\' 5"  (1.651 m)    Body mass index is 22.07 kg/m.  General: A&O x 3, WDWN, female. Gait: normal HENT: No apparent abnormalities  Eyes: PERRLA. Pulmonary: Respirations are non  labored, CTAB, good air movement Cardiac: regular rhythm and rate, +murmur.         Carotid Bruits Right Left   positive Negative   Radial pulses: right is not palpable, left is 2+ palpable. Right brachial pulse is 3+ palpable.  Adominal aortic pulse is not palpable                         VASCULAR EXAM: Extremities without ischemic changes, without Gangrene; without open wounds.                                                                                                                                                       LE Pulses Right Left       FEMORAL  2+ palpable  1+ palpable        POPLITEAL  not palpable   not palpable       POSTERIOR TIBIAL  1+ palpable   not palpable        DORSALIS PEDIS      ANTERIOR TIBIAL 2+ palpable  not palpable    Abdomen: soft, NT, no palpable masses. Skin: no rashes, no ulcers noted. Musculoskeletal: no muscle wasting or atrophy.      Neurologic: A&O X 3; appropriate affect, SENSATION: normal; MOTOR FUNCTION:  moving all extremities equally, motor strength 5/5 throughout. Speech is fluent/normal. CN 2-12 intact except left side facial droop since Bell's Palsy years ago  Psychiatric: Thought content is normal, mood appropriate for clinical situation.     ASSESSMENT: Beverly Li is a 72 y.o. female who is s/p right fem to below knee popliteal by pass with composite vein,  2 pieces of right GSV spliced together, on 03-02-15. She no longer has claudication symptoms in her right leg with walking. She developed mld left calf claudication in December 2018.   She states that she  cannot have any more general anesthetic as she became very hypotensive and had to be admitted to [facility] the last 2 surgeries she had. This is the reason that her blood pressure is allowed to remain slightly elevated.   There is a right carotid bruit today, not present on last exam, pt has no hx of stroke of TIA, she has a hx of Bell's Palsy at age 40, has left side facial droop for this. Will check carotid duplex on her return.   DATA  Right LE Arterial Duplex (05-19-17): Patent right common femoral to below knee popliteal bypass graft with no evidence for restenosis.  No change significant change since the exams on 02-21-16 and 11-06-16.  ABI (Date: 11/06/2016)  R:  ? ABI: 1.09 (was 1.09 on 02-21-16),  ? PT: tri ? DP: tri ? TBI:  0.76 (was 0.75)  L:  ? ABI: 0.84 (was 0.75),  ? PT:  mono ? DP: mono ? TBI: 0.66 (was 0.48) Right ABI and TBI remain normal with triphasic waveforms. Left ABI has improved from 75% to 84%, monophasic waveforms, left TBI has improved from 0.48 to 0.66.     ABI (Date: 05/19/2017):  R:   ABI: 1.1 (was 1.0 on 11-06-16),   PT: tri  DP: tri  TBI:  0.73 (was 0.76)  L:   ABI: 0.75 (was 0.84)),   PT: mono (was mono)  DP: mono (was mono)  TBI: 0.65 (was 0.66)  Stable and normal ABI on the right with triphasic waveforms, decline on the left from 84% to 75%, monophasic waveforms,  which corresponds to new mild claudication on the left.    PLAN:  Based on the patient's vascular studies and examination,  pt will return to clinic in 6 months with ABI's, bilateral LE arterial duplex since left LE ABI declined significantly and she developed mild claudication in left calf since December 2018. Will also check carotid duplex on her return.   I discussed in depth with the patient the nature of atherosclerosis, and emphasized the importance of maximal medical management including strict control of blood pressure, blood glucose, and lipid levels, obtaining  regular exercise, and continued cessation of smoking.  The patient is aware that without maximal medical management the underlying atherosclerotic disease process will progress, limiting the benefit of any interventions.  The patient was given information about PAD including signs, symptoms, treatment, what symptoms should prompt the patient to seek immediate medical care, and risk reduction measures to take.  Charisse March, RN, MSN, FNP-C [facility] [place] Phone: [phone]  Clinic MD: Myra Gianotti  05/19/17 2:08 PM

## 2017-05-19 NOTE — Patient Instructions (Signed)

## 2017-05-27 NOTE — Addendum Note (Signed)
Addended by: Burton ApleyPETTY, Jaryiah Mehlman A on: 05/27/2017 04:10 PM   Modules accepted: Orders

## 2017-06-10 ENCOUNTER — Other Ambulatory Visit: Payer: Self-pay | Admitting: Physician Assistant

## 2017-06-10 NOTE — Telephone Encounter (Signed)
Please review for refill. Thanks!  

## 2017-06-11 NOTE — Telephone Encounter (Signed)
REFILL 

## 2017-06-24 ENCOUNTER — Telehealth: Payer: Self-pay | Admitting: Cardiology

## 2017-06-24 NOTE — Telephone Encounter (Signed)
Take Bystolic 10mg  one full tab PO QD.  Remember, her BP can become severely low at times as well.   Donato SchultzMark Skains, MD

## 2017-06-24 NOTE — Telephone Encounter (Signed)
Pt aware to increase to Bystolic to 10 mg a day. She will f/u as scheduled on Thursday.   Med list updated.

## 2017-06-24 NOTE — Telephone Encounter (Signed)
Spoke with patient who is reporting her BP has been elevated for some time now.  It was elevated in December when she saw her PCP and elevated when it was checked at a [facility] screening (185/105) recently.  It has been continually elevated most every time she checks it.  This AM she took it and it is 167/115 - she took Hydralazine and it decreased to 152/89.  On recheck later it was back at 181/110.  She can only take Hydralazine once every 8 hours.   She is presently taking Bystolic 10 mg 1/2 tablet daily and rarely ever takes midodrine.  She has been scheduled to been seen Thursday with Dr Anne FuSkains.  Advised I will forward this information to him for review and further orders.  Aware I will c/b once I hear from him.

## 2017-06-24 NOTE — Telephone Encounter (Signed)
New message    Pt c/o BP issue: STAT if pt c/o blurred vision, one-sided weakness or slurred speech  1. What are your last 5 BP readings? 167/115, 181/110, 208/118, 193/122  2. Are you having any other symptoms (ex. Dizziness, headache, blurred vision, passed out)? NO  3. What is your BP issue? High BP

## 2017-06-26 ENCOUNTER — Encounter: Payer: Self-pay | Admitting: Cardiology

## 2017-06-26 ENCOUNTER — Ambulatory Visit: Payer: Medicare Other | Admitting: Cardiology

## 2017-06-26 VITALS — BP 158/90 | HR 59 | Ht 65.0 in | Wt 132.8 lb

## 2017-06-26 DIAGNOSIS — I739 Peripheral vascular disease, unspecified: Secondary | ICD-10-CM

## 2017-06-26 DIAGNOSIS — G909 Disorder of the autonomic nervous system, unspecified: Secondary | ICD-10-CM

## 2017-06-26 DIAGNOSIS — I480 Paroxysmal atrial fibrillation: Secondary | ICD-10-CM

## 2017-06-26 DIAGNOSIS — I421 Obstructive hypertrophic cardiomyopathy: Secondary | ICD-10-CM | POA: Diagnosis not present

## 2017-06-26 NOTE — Patient Instructions (Signed)
Medication Instructions:  The current medical regimen is effective;  continue present plan and medications.  Follow-Up: Follow up in 3 months with Dr Skains.  If you need a refill on your cardiac medications before your next appointment, please call your pharmacy.  Thank you for choosing [facility]!!     

## 2017-06-26 NOTE — Progress Notes (Signed)
Cardiology Office Note   Date:  06/26/2017   ID:  Beverly Li, DOB 1945-11-20, MRN [id number]  PCP:  Clayborn Heronankins, Victoria R, MD  Cardiologist:   Donato SchultzMark Alverto Shedd, MD / Graciela HusbandsKlein       History of Present Illness: Beverly MeekerFrances B Cribb is a 72 y.o. female  with hypertrophic obstructive cardiomyopathy, recent severe exacerbation again post cardiac catheterization and prior during femoropopliteal bypass resulting in  phenylephrine , fluids and prolonged intubation here for follow-up.  She underwent diagnostic heart catheterization on 05/07/16 and post procedure developed severe hypotension, hyper vagal response. Requiring once again phenylephrine, facemask oxygen. She did not require intubation. 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 Hydralazine was given post catheterization wound systolic pressure pressure was 190. Profound hypotension and bradycardia noted. This was treated rapidly, fluids, phenylephrine, heart rates as low as the 40s.  Midodrine started. Phenylephrine eventually weaned off. Diuresis noted. Klonopin was started for anxiety, to hopefully help blunt the hyper adrenergic response. She denies being atypically anxious person however.  Echocardiogram 04/2016 showed EF 65-70% with hypertrophic obstructive cardiomyopathy with LVOT gradient of 23 mm at rest.  She is here for follow-up today and should have appointment set up with Dr. Graciela HusbandsKlein to discuss her profound autonomic dysfunction.  In 2014 started having "heart burn" episodes. Debilitating. Out walking dog. By time she would get back, sweat, diahpesis, up neck, nausea then diarrhea. Wiped out for a few hours. Does not feel it now with exercise. Felt 3 hours irreg hb afib kicked in. Afaid to go anywhere. The sweat comes over her hot. Pain. After she described this once again, this is why I proceeded with cardiac catheterization to make sure that with her peripheral vascular disease that she did not have severe  coronary artery disease that was now becoming progressive. Thankfully, she did not have flow limiting disease. Moderate LAD, FFR.  Shortly after cardiac catheterization when her blood pressure increased to the 190 systolic range, she was given hydralazine to reduce her blood pressure and she ended up having a hyper vagal response where her heart rate decreases the 40s, blood pressure plummeted into the 50 systolic, she developed flash pulmonary edema. She was placed in the CCU, phenylephrine. Slowly, this was able to be weaned. Klonopin and Midodrin was started.  This episode post catheterization was very similar to her episode intraoperatively during her femoropopliteal bypass. She was intubated for several days after that procedure and with her arterial line demonstrated classic spike and dome changes intermittently with marked variations in her blood pressure at various points.  09/16/16-since her last visit she has had cardiac MRI and stress echocardiogram. Both were reassuring. She did not drop her blood pressures during exercise. She did not increase her gradient significantly during exercise. We were able to invoke an increase left ventricular output tract gradient with Valsalva maneuver. Please see report. No ventricular arrhythmias. She states that over the last 3 months she's felt the best that she's felt in quite some time. She is drinking sports drinks quite frequently, propel. When she does have low blood pressure usually has a prodrome, very vagal-like response as noted previously.  03/31/17-she had a fall in [place] when walking on cobblestones in a garden with flip-flops during a house store with her girlfriends.  She states that she broke her fall with her lips.  She also broke her arm.  Dr. Cleophas DunkerWhitfield.  She has not had any severe cardiac symptoms thankfully.  At  one point during the last several months she did have low blood pressure and she took the Midrin which took effect after  approximately 3 hours.  She has been drinking propel.  Doing very well.  06/26/17 - 2 episodes with crashed BP, out for the day. Midodrine then. 185/110 at home.  She took a hydralazine at home and now her blood pressure is 158/90.  We once again went over how challenging it is for her given her hypertrophic cardiomyopathy and previous severe hypotension.  Currently she is not have any chest pain, no strokelike symptoms.  No fevers chills nausea vomiting.  Past Medical History:  Diagnosis Date  . Anxiety   . Arthritis   . Atrial fibrillation (HCC)   . Bell's palsy    when pt. was 72 yrs old, when under stress the left side of face will droop.  [name] GERD (gastroesophageal reflux disease)   . History of kidney stones   . Hypertension   . Hypertrophic cardiomyopathy (HCC)    severe LV basilar hypertrophy witn no evidence of significant outflow tract obstruction, EF 65-70%, mild LAE, mild TR, grade 1a diastolic dysfunction 05/15/10 (Dr. Donato Schultz) (Atrial Septal Hypertrophy pattern)-- Intra-op TEE with dsignificant outflow tract obstruction - AI, MR & TR  . Insomnia   . Mild aortic sclerosis (HCC)   . Osteopenia   . Peripheral vascular disease (HCC)   . Syncope    , Vagal    Past Surgical History:  Procedure Laterality Date  . BREAST ENHANCEMENT SURGERY Bilateral   . CARDIAC CATHETERIZATION N/A 05/07/2016   Procedure: Left Heart Cath and Coronary Angiography;  Surgeon: Kathleene Hazel, MD;  Location: [facility] INVASIVE CV LAB;  Service: Cardiovascular;  Laterality: N/A;  . ENDARTERECTOMY FEMORAL Right 03/02/2015   Procedure: ENDARTERECTOMY RIGHT FEMORAL;  Surgeon: Chuck Hint, MD;  Location: [facility] OR;  Service: Vascular;  Laterality: Right;  . FACIAL COSMETIC SURGERY    . FEMORAL-POPLITEAL BYPASS GRAFT Right 03/02/2015   Procedure: BYPASS GRAFT FEMORAL-BELOW KNEE POPLITEAL ARTERY;  Surgeon: Chuck Hint, MD;  Location: [facility] OR;  Service: Vascular;  Laterality: Right;  . HERNIA  REPAIR    . INGUINAL HERNIA REPAIR Bilateral   . left ovarian cyst    . PERIPHERAL VASCULAR CATHETERIZATION N/A 01/16/2015   Procedure: Abdominal Aortogram;  Surgeon: Chuck Hint, MD;  Location: [facility] INVASIVE CV LAB;  Service: Cardiovascular;  Laterality: N/A;  . TONSILLECTOMY       Current Outpatient Medications  Medication Sig Dispense Refill  . alendronate (FOSAMAX) 70 MG tablet TK 1 T PO ONCE A WEEK  3  . aspirin 81 MG tablet Take 81 mg by mouth daily.    [name] buPROPion (WELLBUTRIN XL) 150 MG 24 hr tablet Take 150 mg by mouth daily.    [name] DEXILANT 30 MG capsule Take 1 capsule by mouth daily.    [name] ELIQUIS 5 MG TABS tablet TAKE 1 TABLET(5 MG) BY MOUTH TWICE DAILY 60 tablet 5  . ezetimibe (ZETIA) 10 MG tablet Take 1 tablet (10 mg total) by mouth daily. 30 tablet 11  . hydrALAZINE (APRESOLINE) 25 MG tablet TAKE 1 TABLET BY MOUTH EVERY 8 HOURS AS NEEDED FOR SYSTOLIC BLOOD PRESSURE(TOP NUMBER)>160 270 tablet 0  . midodrine (PROAMATINE) 10 MG tablet Take 1 tablet (10 mg total) by mouth 3 (three) times daily with meals as needed (for hypotension). 90 tablet 1  . Multiple Vitamins-Iron (STRESS B COMPLEX/IRON) TABS Take 1 tablet by mouth daily. Reported on  04/05/2015    . nebivolol (BYSTOLIC) 10 MG tablet Take 10 mg by mouth at bedtime.    [name] PROAIR HFA 108 (90 Base) MCG/ACT inhaler Inhale 2 puffs into the lungs 4 (four) times daily as needed.  1  . rosuvastatin (CRESTOR) 5 MG tablet Patient takes 2.5mg  by mouth on Monday and Tuesday.    . zolpidem (AMBIEN) 5 MG tablet Take 5 mg by mouth at bedtime as needed for sleep.      No current facility-administered medications for this visit.     Allergies:   Amoxicillin; Atenolol; Crestor [rosuvastatin calcium]; Pravastatin; Sulfa antibiotics; and Codeine    Social History:  The patient  reports that she quit smoking about 2 years ago. Her smoking use included cigarettes. She has a 50.00 pack-year smoking history. she has never used smokeless  tobacco. She reports that she drinks alcohol. She reports that she does not use drugs.   Family History:  The patient's family history includes Breast cancer in her sister; Cancer in her father, mother, and sister; Hypertension in her mother; Liver cancer in her mother; Lung cancer in her father.    ROS:  Please see the history of present illness.  All others negative.   PHYSICAL EXAM: VS:  BP (!) 158/90   Pulse (!) 59   Ht 5\' 5"  (1.651 m)   Wt 132 lb 12.8 oz (60.2 kg)   LMP  (LMP Unknown)   SpO2 94%   BMI 22.10 kg/m  , BMI Body mass index is 22.1 kg/m. GEN: Well nourished, well developed, in no acute distress  HEENT: normal  Neck: no JVD, carotid bruits, or masses Cardiac: RRR; 2/6 SM, rubs, or gallops,no edema  Respiratory:  clear to auscultation bilaterally, normal work of breathing GI: soft, nontender, nondistended, + BS MS: no deformity or atrophy  Skin: warm and dry, no rash Neuro:  Alert and Oriented x 3, Strength and sensation are intact Psych: euthymic mood, full affect     EKG:  EKG ordered today 04/22/16-sinus bradycardia rate 58 with biatrial enlargement, T-wave inversion noted in 1, aVL, subtle downsloping noted in V6. Note hypertrophic cardiomyopathy. Prior, sinus brady with T-wave changes same as current. Personally viewed.   Recent Labs: 07/18/2016: Creatinine, Ser 0.70     Lipid Panel    Component Value Date/Time   CHOL 211 (H) 09/16/2016 1200   TRIG 78 09/16/2016 1200   HDL 97 09/16/2016 1200   CHOLHDL 2.2 09/16/2016 1200   LDLCALC 98 09/16/2016 1200  LDL last checked 03/19/17 108    Wt Readings from Last 3 Encounters:  06/26/17 132 lb 12.8 oz (60.2 kg)  05/19/17 132 lb 9.6 oz (60.1 kg)  03/31/17 133 lb 3.2 oz (60.4 kg)      Other studies Reviewed: Additional studies/ records that were reviewed today include:  Hospital records reviewed, echocardiogram reviewed. Review of the above records demonstrates:  As above   ASSESSMENT AND  PLAN:  72 year old female with hypertrophic obstructive cardiomyopathy, moderate coronary artery disease, nonflow limiting LAD lesion by FFR, peripheral vascular disease requiring femoropopliteal bypass, long-standing history of hyper vagal responses.    Hypertrophic obstructive cardiomyopathy/Dyspnea on exertion  -  Avoid dehydration  -  At baseline, outflow tract is not significant  -  Intraoperative. With crossclamping, vasodilatation,   Increased adrenergic drive ,Perfect storm to elucidate obstruction.She was intubated for several days postoperatively femoropopliteal and hemodynamically post significant troubles trying to extubate given her periodic outflow tract obstruction which was severe.  -  We will try to avoid all elective surgical procedures in the future.  -  No high-risk symptoms such as ventricular tachycardia, early cardiac death in family members , unexplained syncope , septum less than 3 cm. Murmur has been present at baseline.   -  Increased Bystolic to 10 QD (decreased heart rate , decreased inotropic beneficial in her situation) given her blood pressure that was highly elevated.   - She describes hypervagal-like experiences including nausea, vomiting, pallor, diaphoresis with subsequent diarrhea, feeling wiped out for hours. She has been battling this for years. Continue to avoid dehydration. She is drinking propel. She knows to lay down immediately if she begins to feel this.  - Now on midodrine PRN.   - Thankfully the stress echocardiogram did not show any drops in blood pressure with exercise. She did not show any increased gradient during exercise on echocardiogram. She did however when performing Valsalva increase her gradient. Please see report. No ventricular arrhythmias.  - MRI did not show any evidence of SAM, minimal gradient.  -Currently remaining stable, no syncope.  Rare Midodrin use.  Challenging however to control hypertension   -Dyspnea-Prior extensive smoking  history- PFTs/pulmonary evaluation, Referred to pulmonary previously. Chest x-ray PA and lateral from 09/19/12 was interpreted as underlying COPD.   Sinus bradycardia   - She takes hydralazine if her blood pressure gets over 160 occasionally. She takes midodrine if it drops significantly but she has not taken this recently.  Stable.    Paroxysmal atrial fibrillation  -  Continuing with anticoagulation, Eliquis  -  No longer on amiodarone.  -  If symptoms arise/worsen, may need to resume antiarrhythmic therapy as HOCM patients do not tolerate atrial fibrillation well.  -  Postoperative,  seems to be maintaining sinus rhythm well.  Anxiety  - would be ok to use low dose Xanax. Dr. Barbaraann Barthel.  This was previously discussed with Dr. Gala Romney.  No changes.   Statin intolerance  -  Did not tolerate Crestor well. When raising her hands above her head her shoulders ached. Since she is stopped, she feels better. Given her peripheral vascular disease we would like her on statin therapy. She wonders if her hip pain or bursitis with statin related. However, she was holding her grandchild on that hip for quite some time.  -  Did not tolerate pravastatin 20 mg once a day either. Her legs bother her significant only.  - Appreciate help with lipid clinic for further discussion, Zetia may be option.  Essential hypertension, labile hypertension -increased Bystolic to 10. Challenging.  Understands stroke risks however understands risks of cardia vascular collapse given her swings.  Disposition:   FU with Brenton Joines in 3 months.  Signed, Donato Schultz, MD  06/26/2017 11:00 AM    [facility] [address] [place], [place], [place]  [address] Phone: [phone]; Fax: [phone]

## 2017-07-02 ENCOUNTER — Telehealth: Payer: Self-pay | Admitting: Cardiology

## 2017-07-02 MED ORDER — DILTIAZEM HCL ER 120 MG PO CP24: 120.0000 mg | ORAL_CAPSULE | Freq: Every day | ORAL | 6 refills | Status: DC

## 2017-07-02 NOTE — Telephone Encounter (Signed)
New Message  Pt c/o medication issue:  1. Name of Medication: nebivolol (BYSTOLIC) 10 MG tablet  2. How are you currently taking this medication (dosage and times per day)? Take 10 mg by mouth at bedtime  3. Are you having a reaction (difficulty breathing--STAT)? Breathing but says its not acute  4. What is your medication issue? Pt states that her bp meds was increased but her bp is still running high and she is having some difficulty breathing

## 2017-07-02 NOTE — Telephone Encounter (Signed)
Bring the Bystolic back to 5mg  PO QD. Let's add cardizem CD 120mg  PO QD in addition to Bystolic. Donato SchultzMark Hy Swiatek, MD

## 2017-07-02 NOTE — Telephone Encounter (Signed)
Spoke with pt who has been having bouts of breathlessness since increasing Bystolic last week.  It feels like she can't get a good breathe.  She feels as though to the 2 are related since nothing else has changed in her life.  Last BP was this AM 185/113.  She used the Hydralazine 25 mg however it only brings the BP down maybe "10 points" so instead of it being 185 it's 175.  She has had one episode of hypotension on Sunday.  She reports this does happen maybe once every 2 to 3 weeks.  Advised I will review with Dr Anne FuSkains and call her back.  She asked that I call on her cell phone this afternoon since she is going to be out [phone].

## 2017-07-02 NOTE — Telephone Encounter (Signed)
Pt aware of medication changes.  Rx sent into [facility] [place].  She will continue to monitor BP and call back if no improvement.

## 2017-07-04 ENCOUNTER — Other Ambulatory Visit: Payer: Self-pay | Admitting: Cardiology

## 2017-07-16 ENCOUNTER — Telehealth: Payer: Self-pay | Admitting: Cardiology

## 2017-07-16 NOTE — Telephone Encounter (Signed)
New message     Pt c/o medication issue:  1. Name of Medication:  diltiazem (DILACOR XR) 120 MG 24 hr capsule Take 1 capsule (120 mg total) by mouth daily.         2. How are you currently taking this medication (dosage and times per day)?  Once a day  3. Are you having a reaction (difficulty breathing--STAT)?  no  4. What is your medication issue? Patient stopped taking medication  Due to it dropping her bp dropping to low 70/50

## 2017-07-16 NOTE — Telephone Encounter (Signed)
Agree, stop the diltiazem 120 mg CD It may make more sense for her to take short acting diltiazem 30 mg p.o. every 6 hours as needed if blood pressure elevates greater than 160 systolic.  She seems to respond to the diltiazem but perhaps taking it daily long-acting was too much for her system.  Donato SchultzMark Avram Danielson, MD

## 2017-07-16 NOTE — Telephone Encounter (Signed)
Spoke with pt who reports she took Diltiazem as ordered the first day and was fine.  The 2nd day she had dizzy spells and the 3 rd day she had 3 drops in BP to around 70/50.  She stopped the diltiazem.  BPs have been back up to as high as 180/110.  She takes Hydralazine when this occurs however reports "it doesn't do much."  Just wants to make Dr Anne FuSkains aware.  Advised I will forward information to him for his review.

## 2017-07-17 ENCOUNTER — Other Ambulatory Visit: Payer: Self-pay | Admitting: Cardiology

## 2017-07-17 MED ORDER — DILTIAZEM HCL 30 MG PO TABS: 30.0000 mg | ORAL_TABLET | Freq: Four times a day (QID) | ORAL | 3 refills | Status: DC | PRN

## 2017-07-17 NOTE — Telephone Encounter (Signed)
Discussed with patient who states understanding.  She is in agreement to try 30 mg Diltiazem prn for HTN.

## 2017-08-14 ENCOUNTER — Other Ambulatory Visit: Payer: Self-pay | Admitting: Cardiology

## 2017-09-23 ENCOUNTER — Encounter: Payer: Self-pay | Admitting: Internal Medicine

## 2017-09-23 ENCOUNTER — Ambulatory Visit: Payer: Medicare Other | Admitting: Internal Medicine

## 2017-09-23 ENCOUNTER — Telehealth: Payer: Self-pay | Admitting: Cardiology

## 2017-09-23 DIAGNOSIS — G909 Disorder of the autonomic nervous system, unspecified: Secondary | ICD-10-CM | POA: Diagnosis not present

## 2017-09-23 DIAGNOSIS — I421 Obstructive hypertrophic cardiomyopathy: Secondary | ICD-10-CM | POA: Diagnosis not present

## 2017-09-23 DIAGNOSIS — I480 Paroxysmal atrial fibrillation: Secondary | ICD-10-CM | POA: Diagnosis not present

## 2017-09-23 DIAGNOSIS — I959 Hypotension, unspecified: Secondary | ICD-10-CM | POA: Diagnosis not present

## 2017-09-23 NOTE — Telephone Encounter (Signed)
New message   Pt c/o BP issue: STAT if pt c/o blurred vision, one-sided weakness or slurred speech  1. What are your last 5 BP readings? 113/96, 132/108, 130/106,   2. Are you having any other symptoms (ex. Dizziness, headache, blurred vision, passed out)? Headache, fatigue  3. What is your BP issue? Patient states her BP is too low, requesting call from nurse

## 2017-09-23 NOTE — Telephone Encounter (Signed)
Pt aware Dr Anne FuSkains wants her evaluated today.  Appt scheduled with Dr Graciela HusbandsKlein for 2:30 pm.  Pt is aware and agreeable.

## 2017-09-23 NOTE — Telephone Encounter (Signed)
Spoke with patient who is reporting that since Wednesday last week she has been extremely tired, sleeping a lot and BP and HR have been elevated.  BPs running 130's/108,  HR has been between 124-165 bpm.   She has been taking medications as listed, doesn't feel as though she has done anything different to cause the changes and is unsure what to do at this point.  Advised I will contact Dr Anne FuSkains to review information and c/b with orders/instructions.  Pt does have an appointment scheduled with Dr Anne FuSkains on Thursday this week.

## 2017-09-23 NOTE — Progress Notes (Signed)
Patient Care Team: Rankins, Fanny DanceVictoria R, MD as PCP - General (Family Medicine) Jake BatheSkains, Mark C, MD as Consulting Physician (Cardiology) Shirlean KellyNudelman, Robert, MD as Consulting Physician (Neurosurgery)   HPI  Beverly Li is a 72 y.o. female Seen at the request of Dr. Donato SchultzMark Skains because of tachycardia.  She was found to be in atrial flutter She has a history of labile hypertension and during vascular procedure 11/16 she developed hypotension and suffered a respiratory arrest.  She underwent intubation and phenylephrine infusion.  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  1/18 she underwent catheterization for chest pain.  No coronary disease was noted but she had significant postprocedural hypertension and received IV hydralazine.  She became profoundly bradycardic and hypotensive again requiring Neo-Synephrine.  She developed respiratory distress but did not require intubation.  Records and Results Reviewed   Past Medical History:  Diagnosis Date  . Anxiety   . Arthritis   . Atrial fibrillation (HCC)   . Bell's palsy    when pt. was 72 yrs old, when under stress the left side of face will droop.  [name]. GERD (gastroesophageal reflux disease)   . History of kidney stones   . Hypertension   . Hypertrophic cardiomyopathy (HCC)    severe LV basilar hypertrophy witn no evidence of significant outflow tract obstruction, EF 65-70%, mild LAE, mild TR, grade 1a diastolic dysfunction 05/15/10 (Dr. Donato SchultzMark Skains) (Atrial Septal Hypertrophy pattern)-- Intra-op TEE with dsignificant outflow tract obstruction - AI, MR & TR  . Insomnia   . Mild aortic sclerosis (HCC)   . Osteopenia   . Peripheral vascular disease (HCC)   . Syncope    , Vagal    Past Surgical History:  Procedure Laterality Date  . BREAST ENHANCEMENT SURGERY Bilateral   . CARDIAC CATHETERIZATION N/A 05/07/2016   Procedure: Left Heart Cath and Coronary Angiography;  Surgeon: Kathleene Hazelhristopher D McAlhany, MD;   [facility];  Service: Cardiovascular;  Laterality: N/A;  . ENDARTERECTOMY FEMORAL Right 03/02/2015   Procedure: ENDARTERECTOMY RIGHT FEMORAL;  Surgeon: Chuck Hinthristopher S Dickson, MD;  [facility];  Service: Vascular;  Laterality: Right;  . FACIAL COSMETIC SURGERY    . FEMORAL-POPLITEAL BYPASS GRAFT Right 03/02/2015   Procedure: BYPASS GRAFT FEMORAL-BELOW KNEE POPLITEAL ARTERY;  Surgeon: Chuck Hinthristopher S Dickson, MD;  [facility];  Service: Vascular;  Laterality: Right;  . HERNIA REPAIR    . INGUINAL HERNIA REPAIR Bilateral   . left ovarian cyst    . PERIPHERAL VASCULAR CATHETERIZATION N/A 01/16/2015   Procedure: Abdominal Aortogram;  Surgeon: Chuck Hinthristopher S Dickson, MD;  [facility];  Service: Cardiovascular;  Laterality: N/A;  . TONSILLECTOMY      Current Meds  Medication Sig  . alendronate (FOSAMAX) 70 MG tablet TK 1 T PO ONCE A WEEK  . aspirin 81 MG tablet Take 81 mg by mouth daily.  [name]. buPROPion (WELLBUTRIN XL) 150 MG 24 hr tablet Take 150 mg by mouth daily.  [name]. DEXILANT 30 MG capsule Take 1 capsule by mouth daily.  [name]. diltiazem (CARDIZEM) 30 MG tablet Take 1 tablet (30 mg total) by mouth every 6 (six) hours as needed.  [name]. ELIQUIS 5 MG TABS tablet TAKE 1 TABLET(5 MG) BY MOUTH TWICE DAILY  . ezetimibe (ZETIA) 10 MG tablet Take 1 tablet (10 mg total) by mouth daily.  . hydrALAZINE (APRESOLINE) 25 MG tablet TAKE 1 TABLET BY MOUTH EVERY 8 HOURS AS NEEDED FOR SYSTOLIC BLOOD  PRESSURE(TOP NUMBER)>160  . midodrine (PROAMATINE) 10 MG tablet Take 1 tablet (10 mg total) by mouth 3 (three) times daily with meals as needed (for hypotension).  . Multiple Vitamins-Iron (STRESS B COMPLEX/IRON) TABS Take 1 tablet by mouth daily. Reported on 04/05/2015  . nebivolol (BYSTOLIC) 10 MG tablet Take 5 mg by mouth at bedtime.  [name] PROAIR HFA 108 (90 Base) MCG/ACT inhaler Inhale 2 puffs into the lungs 4 (four) times daily as needed.  . rosuvastatin (CRESTOR) 5 MG tablet Patient  takes 2.5mg  by mouth on Monday and Tuesday.  . zolpidem (AMBIEN) 5 MG tablet Take 5 mg by mouth at bedtime as needed for sleep.     Allergies  Allergen Reactions  . Amoxicillin Other (See Comments)    UTI  . Atenolol Cough  . Crestor [Rosuvastatin Calcium]     Muscle aches  . Pravastatin     Muscle aches  . Sulfa Antibiotics Nausea Only  . 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  Hence, we will not send her to the emergency room.  We have spoken with anesthesia discussed this with Dr. Sherlie Ban with the hospital tomorrow.  She will be cardioverted.  Avoiding hypotension in the context of hypertrophic cardiomyopathy-obstructive is essential.  I have also discussed the case with Dr. Anne Fu.   More than 50% of 40 min was spent in counseling related to the above       Current medicines are reviewed at length with the patient today .  The patient does not  have concerns regarding medicines.

## 2017-09-23 NOTE — Telephone Encounter (Signed)
Please have her come in today if possible. With HR that fast, may be in AFIB again. Used to be on amiodarone. May need to go back on amio. With HOCM, does not tolerate AFIB well. Thanks  Donato SchultzMark Skains, MD

## 2017-09-23 NOTE — Patient Instructions (Addendum)
Medication Instructions:  Your physician recommends that you continue on your current medications as directed. Please refer to the Current Medication list given to you today.  Labwork: You will have labs drawn today: CBC and BMP   Testing/Procedures: Your physician has recommended that you have a Cardioversion (DCCV). Electrical Cardioversion uses a jolt of electricity to your heart either through paddles or wired patches attached to your chest. This is a controlled, usually prescheduled, procedure. Defibrillation is done under light anesthesia in the hospital, and you usually go home the day of the procedure. This is done to get your heart back into a normal rhythm. You are not awake for the procedure. Please see the instruction sheet given to you today.  Follow-Up: Your physician recommends that you schedule a follow-up appointment with  Dr Anne FuSkains in 4 weeks.   Any Other Special Instructions Will Be Listed Below (If Applicable).  Dear Beverly Li You are scheduled for a Cardioversion on 6/12 with Dr. Shirlee LatchMcLean.  Please arrive [facility]ance [facility] at [facility]: [address] [place], [place] [address] at 1:00 pm for a 2:30pm procedure.  DIET: Nothing to eat or drink after midnight except a sip of water with medications (see medication instructions below)  Medication Instructions: Take your morning medications as normal with a sip of water.  Continue your anticoagulant: Eliquis You will need to continue your anticoagulant after your procedure until you  are told by your  Provider that it is safe to stop   Labs: You will have labs drawn today  You must have a responsible person to drive you home and stay in the waiting area during your procedure. Failure to do so could result in cancellation.  Bring your insurance cards.  *Special Note: Every effort is made to have your procedure done on time. Occasionally there are emergencies that occur at the hospital that  may cause delays. Please be patient if a delay does occur.     If you need a refill on your cardiac medications before your next appointment, please call your pharmacy.

## 2017-09-23 NOTE — H&P (View-Only) (Signed)
Patient Care Team: Rankins, Fanny DanceVictoria R, MD as PCP - General (Family Medicine) Jake BatheSkains, Mark C, MD as Consulting Physician (Cardiology) Shirlean KellyNudelman, Robert, MD as Consulting Physician (Neurosurgery)   HPI  Beverly Li is a 72 y.o. female Seen at the request of Dr. Donato SchultzMark Skains because of tachycardia.  She was found to be in atrial flutter She has a history of labile hypertension and during vascular procedure 11/16 she developed hypotension and suffered a respiratory arrest.  She underwent intubation and phenylephrine infusion.  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  1/18 she underwent catheterization for chest pain.  No coronary disease was noted but she had significant postprocedural hypertension and received IV hydralazine.  She became profoundly bradycardic and hypotensive again requiring Neo-Synephrine.  She developed respiratory distress but did not require intubation.  Records and Results Reviewed   Past Medical History:  Diagnosis Date  . Anxiety   . Arthritis   . Atrial fibrillation (HCC)   . Bell's palsy    when pt. was 72 yrs old, when under stress the left side of face will droop.  [name]. GERD (gastroesophageal reflux disease)   . History of kidney stones   . Hypertension   . Hypertrophic cardiomyopathy (HCC)    severe LV basilar hypertrophy witn no evidence of significant outflow tract obstruction, EF 65-70%, mild LAE, mild TR, grade 1a diastolic dysfunction 05/15/10 (Dr. Donato SchultzMark Skains) (Atrial Septal Hypertrophy pattern)-- Intra-op TEE with dsignificant outflow tract obstruction - AI, MR & TR  . Insomnia   . Mild aortic sclerosis (HCC)   . Osteopenia   . Peripheral vascular disease (HCC)   . Syncope    , Vagal    Past Surgical History:  Procedure Laterality Date  . BREAST ENHANCEMENT SURGERY Bilateral   . CARDIAC CATHETERIZATION N/A 05/07/2016   Procedure: Left Heart Cath and Coronary Angiography;  Surgeon: Kathleene Hazelhristopher D McAlhany, MD;   [facility];  Service: Cardiovascular;  Laterality: N/A;  . ENDARTERECTOMY FEMORAL Right 03/02/2015   Procedure: ENDARTERECTOMY RIGHT FEMORAL;  Surgeon: Chuck Hinthristopher S Dickson, MD;  [facility];  Service: Vascular;  Laterality: Right;  . FACIAL COSMETIC SURGERY    . FEMORAL-POPLITEAL BYPASS GRAFT Right 03/02/2015   Procedure: BYPASS GRAFT FEMORAL-BELOW KNEE POPLITEAL ARTERY;  Surgeon: Chuck Hinthristopher S Dickson, MD;  [facility];  Service: Vascular;  Laterality: Right;  . HERNIA REPAIR    . INGUINAL HERNIA REPAIR Bilateral   . left ovarian cyst    . PERIPHERAL VASCULAR CATHETERIZATION N/A 01/16/2015   Procedure: Abdominal Aortogram;  Surgeon: Chuck Hinthristopher S Dickson, MD;  [facility];  Service: Cardiovascular;  Laterality: N/A;  . TONSILLECTOMY      Current Meds  Medication Sig  . alendronate (FOSAMAX) 70 MG tablet TK 1 T PO ONCE A WEEK  . aspirin 81 MG tablet Take 81 mg by mouth daily.  [name]. buPROPion (WELLBUTRIN XL) 150 MG 24 hr tablet Take 150 mg by mouth daily.  [name]. DEXILANT 30 MG capsule Take 1 capsule by mouth daily.  [name]. diltiazem (CARDIZEM) 30 MG tablet Take 1 tablet (30 mg total) by mouth every 6 (six) hours as needed.  [name]. ELIQUIS 5 MG TABS tablet TAKE 1 TABLET(5 MG) BY MOUTH TWICE DAILY  . ezetimibe (ZETIA) 10 MG tablet Take 1 tablet (10 mg total) by mouth daily.  . hydrALAZINE (APRESOLINE) 25 MG tablet TAKE 1 TABLET BY MOUTH EVERY 8 HOURS AS NEEDED FOR SYSTOLIC BLOOD  PRESSURE(TOP NUMBER)>160  . midodrine (PROAMATINE) 10 MG tablet Take 1 tablet (10 mg total) by mouth 3 (three) times daily with meals as needed (for hypotension).  . Multiple Vitamins-Iron (STRESS B COMPLEX/IRON) TABS Take 1 tablet by mouth daily. Reported on 04/05/2015  . nebivolol (BYSTOLIC) 10 MG tablet Take 5 mg by mouth at bedtime.  [name] PROAIR HFA 108 (90 Base) MCG/ACT inhaler Inhale 2 puffs into the lungs 4 (four) times daily as needed.  . rosuvastatin (CRESTOR) 5 MG tablet Patient  takes 2.5mg  by mouth on Monday and Tuesday.  . zolpidem (AMBIEN) 5 MG tablet Take 5 mg by mouth at bedtime as needed for sleep.     Allergies  Allergen Reactions  . Amoxicillin Other (See Comments)    UTI  . Atenolol Cough  . Crestor [Rosuvastatin Calcium]     Muscle aches  . Pravastatin     Muscle aches  . Sulfa Antibiotics Nausea Only  . 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  Hence, we will not send her to the emergency room.  We have spoken with anesthesia discussed this with Dr. Sherlie Ban with the hospital tomorrow.  She will be cardioverted.  Avoiding hypotension in the context of hypertrophic cardiomyopathy-obstructive is essential.  I have also discussed the case with Dr. Anne Fu.   More than 50% of 40 min was spent in counseling related to the above       Current medicines are reviewed at length with the patient today .  The patient does not  have concerns regarding medicines.

## 2017-09-24 ENCOUNTER — Ambulatory Visit (HOSPITAL_COMMUNITY): Payer: Medicare Other | Admitting: Anesthesiology

## 2017-09-24 ENCOUNTER — Encounter (HOSPITAL_COMMUNITY)
Admission: RE | Disposition: A | Discharge status: Home / Self Care | Payer: Self-pay | Source: Ambulatory Visit | Attending: Cardiology

## 2017-09-24 ENCOUNTER — Ambulatory Visit (HOSPITAL_COMMUNITY)
Admission: RE | Admit: 2017-09-24 | Discharge: 2017-09-24 | Disposition: A | Discharge status: Home / Self Care | Payer: Medicare Other | Source: Ambulatory Visit | Attending: Cardiology | Admitting: Cardiology

## 2017-09-24 ENCOUNTER — Other Ambulatory Visit: Payer: Self-pay

## 2017-09-24 DIAGNOSIS — Z955 Presence of coronary angioplasty implant and graft: Secondary | ICD-10-CM | POA: Insufficient documentation

## 2017-09-24 DIAGNOSIS — I421 Obstructive hypertrophic cardiomyopathy: Secondary | ICD-10-CM | POA: Insufficient documentation

## 2017-09-24 DIAGNOSIS — G510 Bell's palsy: Secondary | ICD-10-CM | POA: Insufficient documentation

## 2017-09-24 DIAGNOSIS — R001 Bradycardia, unspecified: Secondary | ICD-10-CM | POA: Insufficient documentation

## 2017-09-24 DIAGNOSIS — Z7901 Long term (current) use of anticoagulants: Secondary | ICD-10-CM | POA: Diagnosis not present

## 2017-09-24 DIAGNOSIS — I4891 Unspecified atrial fibrillation: Secondary | ICD-10-CM | POA: Diagnosis not present

## 2017-09-24 DIAGNOSIS — I10 Essential (primary) hypertension: Secondary | ICD-10-CM | POA: Diagnosis not present

## 2017-09-24 DIAGNOSIS — Z885 Allergy status to narcotic agent status: Secondary | ICD-10-CM | POA: Diagnosis not present

## 2017-09-24 DIAGNOSIS — Z882 Allergy status to sulfonamides status: Secondary | ICD-10-CM | POA: Insufficient documentation

## 2017-09-24 DIAGNOSIS — Z880 Allergy status to penicillin: Secondary | ICD-10-CM | POA: Insufficient documentation

## 2017-09-24 DIAGNOSIS — Z7982 Long term (current) use of aspirin: Secondary | ICD-10-CM | POA: Diagnosis not present

## 2017-09-24 DIAGNOSIS — M8580 Other specified disorders of bone density and structure, unspecified site: Secondary | ICD-10-CM | POA: Diagnosis not present

## 2017-09-24 DIAGNOSIS — Z79899 Other long term (current) drug therapy: Secondary | ICD-10-CM | POA: Insufficient documentation

## 2017-09-24 DIAGNOSIS — Z888 Allergy status to other drugs, medicaments and biological substances status: Secondary | ICD-10-CM | POA: Diagnosis not present

## 2017-09-24 DIAGNOSIS — I4892 Unspecified atrial flutter: Secondary | ICD-10-CM | POA: Diagnosis not present

## 2017-09-24 DIAGNOSIS — Z9889 Other specified postprocedural states: Secondary | ICD-10-CM | POA: Diagnosis not present

## 2017-09-24 DIAGNOSIS — Z87442 Personal history of urinary calculi: Secondary | ICD-10-CM | POA: Insufficient documentation

## 2017-09-24 DIAGNOSIS — Z951 Presence of aortocoronary bypass graft: Secondary | ICD-10-CM | POA: Diagnosis not present

## 2017-09-24 DIAGNOSIS — I480 Paroxysmal atrial fibrillation: Secondary | ICD-10-CM

## 2017-09-24 DIAGNOSIS — K219 Gastro-esophageal reflux disease without esophagitis: Secondary | ICD-10-CM | POA: Diagnosis not present

## 2017-09-24 DIAGNOSIS — M1990 Unspecified osteoarthritis, unspecified site: Secondary | ICD-10-CM | POA: Diagnosis not present

## 2017-09-24 HISTORY — PX: CARDIOVERSION: SHX1299

## 2017-09-24 LAB — CBC WITH DIFFERENTIAL/PLATELET
BASOS: 0 %
Basophils Absolute: 0 10*3/uL (ref 0.0–0.2)
EOS (ABSOLUTE): 0 10*3/uL (ref 0.0–0.4)
EOS: 0 %
HEMATOCRIT: 49.4 % — AB (ref 34.0–46.6)
Hemoglobin: 16.2 g/dL — ABNORMAL HIGH (ref 11.1–15.9)
IMMATURE GRANS (ABS): 0 10*3/uL (ref 0.0–0.1)
Immature Granulocytes: 0 %
LYMPHS: 26 %
Lymphocytes Absolute: 3.3 10*3/uL — ABNORMAL HIGH (ref 0.7–3.1)
MCH: 29.5 pg (ref 26.6–33.0)
MCHC: 32.8 g/dL (ref 31.5–35.7)
MCV: 90 fL (ref 79–97)
MONOS ABS: 1.3 10*3/uL — AB (ref 0.1–0.9)
Monocytes: 10 %
Neutrophils Absolute: 8 10*3/uL — ABNORMAL HIGH (ref 1.4–7.0)
Neutrophils: 64 %
PLATELETS: 308 10*3/uL (ref 150–450)
RBC: 5.5 x10E6/uL — AB (ref 3.77–5.28)
RDW: 15.3 % (ref 12.3–15.4)
WBC: 12.6 10*3/uL — AB (ref 3.4–10.8)

## 2017-09-24 LAB — BASIC METABOLIC PANEL
BUN/Creatinine Ratio: 36 — ABNORMAL HIGH (ref 12–28)
BUN: 29 mg/dL — AB (ref 8–27)
CALCIUM: 9.7 mg/dL (ref 8.7–10.3)
CO2: 25 mmol/L (ref 20–29)
CREATININE: 0.81 mg/dL (ref 0.57–1.00)
Chloride: 103 mmol/L (ref 96–106)
GFR calc Af Amer: 84 mL/min/{1.73_m2} (ref >59)
GFR, EST NON AFRICAN AMERICAN: 73 mL/min/{1.73_m2} (ref >59)
GLUCOSE: 103 mg/dL — AB (ref 65–99)
Potassium: 4.5 mmol/L (ref 3.5–5.2)
Sodium: 143 mmol/L (ref 134–144)

## 2017-09-24 SURGERY — CARDIOVERSION
Anesthesia: General

## 2017-09-24 MED ORDER — ETOMIDATE 2 MG/ML IV SOLN
INTRAVENOUS | Status: DC | PRN
  Administered 2017-09-24: 14 mg via INTRAVENOUS

## 2017-09-24 MED ORDER — SODIUM CHLORIDE 0.45 % IV SOLN: INTRAVENOUS | Status: DC

## 2017-09-24 MED ORDER — LIDOCAINE HCL (CARDIAC) PF 100 MG/5ML IV SOSY
PREFILLED_SYRINGE | INTRAVENOUS | Status: DC | PRN
  Administered 2017-09-24: 30 mg via INTRAVENOUS

## 2017-09-24 MED ORDER — SODIUM CHLORIDE 0.9 % IV SOLN
INTRAVENOUS | Status: DC
  Administered 2017-09-24: 14:00:00 via INTRAVENOUS

## 2017-09-24 NOTE — Transfer of Care (Signed)
Immediate Anesthesia Transfer of Care Note  Patient: Beverly Li  Procedure(s) Performed: CARDIOVERSION (N/A )  Patient Location: Endoscopy Unit  Anesthesia Type:General  Level of Consciousness: drowsy  Airway & Oxygen Therapy: Patient Spontanous Breathing and Patient connected to nasal cannula oxygen  Post-op Assessment: Report given to RN and Post -op Vital signs reviewed and stable  Post vital signs: Reviewed and stable  Last Vitals:  Vitals Value Taken Time  BP 168/97 09/24/2017  2:42 PM  Temp    Pulse 67 09/24/2017  2:42 PM  Resp 25 09/24/2017  2:42 PM  SpO2 99 % 09/24/2017  2:42 PM    Last Pain:  Vitals:   09/24/17 1442  TempSrc: Oral  PainSc:          Complications: No apparent anesthesia complications

## 2017-09-24 NOTE — Anesthesia Postprocedure Evaluation (Signed)
Anesthesia Post Note  Patient: Beverly Li  Procedure(s) Performed: CARDIOVERSION (N/A )     Patient location during evaluation: Endoscopy Anesthesia Type: General Level of consciousness: awake and alert, oriented and patient cooperative Pain management: pain level controlled Vital Signs Assessment: post-procedure vital signs reviewed and stable Respiratory status: spontaneous breathing, nonlabored ventilation and respiratory function stable Cardiovascular status: blood pressure returned to baseline and stable Postop Assessment: no apparent nausea or vomiting Anesthetic complications: no    Last Vitals:  Vitals:   09/24/17 1330 09/24/17 1442  BP:  (!) 168/97  Pulse: (!) 140 67  Resp: 20 (!) 25  Temp:    SpO2: 95% 99%    Last Pain:  Vitals:   09/24/17 1442  TempSrc: Oral  PainSc:                  Jlyn Cerros,E. Autum Benfer

## 2017-09-24 NOTE — Discharge Instructions (Signed)
Electrical Cardioversion, Care After °This sheet gives you information about how to care for yourself after your procedure. Your health care provider may also give you more specific instructions. If you have problems or questions, contact your health care provider. °What can I expect after the procedure? °After the procedure, it is common to have: °· Some redness on the skin where the shocks were given. ° °Follow these instructions at home: °· Do not drive for 24 hours if you were given a medicine to help you relax (sedative). °· Take over-the-counter and prescription medicines only as told by your health care provider. °· Ask your health care provider how to check your pulse. Check it often. °· Rest for 48 hours after the procedure or as told by your health care provider. °· Avoid or limit your caffeine use as told by your health care provider. °Contact a health care provider if: °· You feel like your heart is beating too quickly or your pulse is not regular. °· You have a serious muscle cramp that does not go away. °Get help right away if: °· You have discomfort in your chest. °· You are dizzy or you feel faint. °· You have trouble breathing or you are short of breath. °· Your speech is slurred. °· You have trouble moving an arm or leg on one side of your body. °· Your fingers or toes turn cold or blue. °This information is not intended to replace advice given to you by your health care provider. Make sure you discuss any questions you have with your health care provider. °Document Released: 01/20/2013 Document Revised: 11/03/2015 Document Reviewed: 10/06/2015 °Elsevier Interactive Patient Education © 2018 [employer] Inc[employer] ° °

## 2017-09-24 NOTE — Telephone Encounter (Signed)
Discussed with Dr. Graciela HusbandsKlein on phone.  Appreciate him seeing her.  Going in for cardioversion.  Being very careful with blood pressure.  She has HOCM.  Appreciate his ideas about antiarrhythmic therapy for her in the future.  Continue with collaborative team care.

## 2017-09-24 NOTE — Procedures (Signed)
Electrical Cardioversion Procedure Note Beverly Li [id number] 08/27/1945  Procedure: Electrical Cardioversion Indications:  Atrial Flutter  Procedure Details Consent: Risks of procedure as well as the alternatives and risks of each were explained to the (patient/caregiver).  Consent for procedure obtained. Time Out: Verified patient identification, verified procedure, site/side was marked, verified correct patient position, special equipment/implants available, medications/allergies/relevent history reviewed, required imaging and test results available.  Performed  Patient placed on cardiac monitor, pulse oximetry, supplemental oxygen as necessary.  Sedation given: Propofol per anesthesiology Pacer pads placed anterior and posterior chest.  Cardioverted 1 time(s).  Cardioverted at 150J.  Evaluation Findings: Post procedure EKG shows: NSR Complications: None Patient did tolerate procedure well.   Beverly Li 09/24/2017, 2:40 PM

## 2017-09-24 NOTE — Anesthesia Preprocedure Evaluation (Addendum)
Anesthesia Evaluation  Patient identified by MRN, date of birth, ID band Patient awake    Reviewed: Allergy & Precautions, NPO status , Patient's Chart, lab work & pertinent test results, reviewed documented beta blocker date and time   History of Anesthesia Complications Negative for: history of anesthetic complications  Airway Mallampati: I  TM Distance: >3 FB Neck ROM: Full    Dental  (+) Dental Advisory Given   Pulmonary former smoker,    breath sounds clear to auscultation       Cardiovascular hypertension, Pt. on medications and Pt. on home beta blockers + Peripheral Vascular Disease   Rhythm:Irregular Rate:Normal  5/18 stress ECHO: Baseline: Severe LVH EF 70%-- No SAM --2.6448m/s intracavitary gradient Mid stress and Peak stress -- no increase or decrease in intracavitary gradient. No decrease in BP during stress In recovery, Valsalva performed which increased intracavitary gradient to 4.6581m/s.   Neuro/Psych Anxiety negative neurological ROS     GI/Hepatic Neg liver ROS, GERD  Medicated and Controlled,  Endo/Other  negative endocrine ROS  Renal/GU negative Renal ROS     Musculoskeletal   Abdominal   Peds  Hematology eliquis   Anesthesia Other Findings   Reproductive/Obstetrics                            Anesthesia Physical Anesthesia Plan  ASA: III  Anesthesia Plan: General   Post-op Pain Management:    Induction: Intravenous  PONV Risk Score and Plan: 3 and Treatment may vary due to age or medical condition  Airway Management Planned: Natural Airway and Mask  Additional Equipment:   Intra-op Plan:   Post-operative Plan:   Informed Consent: I have reviewed the patients History and Physical, chart, labs and discussed the procedure including the risks, benefits and alternatives for the proposed anesthesia with the patient or authorized representative who has indicated  his/her understanding and acceptance.   Dental advisory given  Plan Discussed with: CRNA and Surgeon  Anesthesia Plan Comments:         Anesthesia Quick Evaluation

## 2017-09-24 NOTE — Interval H&P Note (Signed)
History and Physical Interval Note:  09/24/2017 2:20 PM  Beverly Li  has presented today for surgery, with the diagnosis of atrial fibrillation  The various methods of treatment have been discussed with the patient and family. After consideration of risks, benefits and other options for treatment, the patient has consented to  Procedure(s): CARDIOVERSION (N/A) as a surgical intervention .  The patient's history has been reviewed, patient examined, no change in status, stable for surgery.  I have reviewed the patient's chart and labs.  Questions were answered to the patient's satisfaction.     Shakeerah Gradel [employer]

## 2017-09-25 ENCOUNTER — Ambulatory Visit: Payer: Medicare Other | Admitting: Cardiology

## 2017-09-26 ENCOUNTER — Telehealth: Payer: Self-pay | Admitting: Physician Assistant

## 2017-09-26 ENCOUNTER — Emergency Department (HOSPITAL_COMMUNITY): Payer: Medicare Other

## 2017-09-26 ENCOUNTER — Emergency Department (HOSPITAL_COMMUNITY)
Admission: EM | Admit: 2017-09-26 | Discharge: 2017-09-27 | Disposition: A | Discharge status: Home / Self Care | Payer: Medicare Other | Attending: Emergency Medicine | Admitting: Emergency Medicine

## 2017-09-26 ENCOUNTER — Encounter (HOSPITAL_COMMUNITY): Payer: Self-pay | Admitting: Cardiology

## 2017-09-26 ENCOUNTER — Other Ambulatory Visit: Payer: Self-pay

## 2017-09-26 DIAGNOSIS — Z5321 Procedure and treatment not carried out due to patient leaving prior to being seen by health care provider: Secondary | ICD-10-CM | POA: Diagnosis not present

## 2017-09-26 DIAGNOSIS — R0602 Shortness of breath: Secondary | ICD-10-CM | POA: Insufficient documentation

## 2017-09-26 LAB — BASIC METABOLIC PANEL
Anion gap: 9 (ref 5–15)
BUN: 20 mg/dL (ref 6–20)
CALCIUM: 9.1 mg/dL (ref 8.9–10.3)
CO2: 28 mmol/L (ref 22–32)
CREATININE: 0.63 mg/dL (ref 0.44–1.00)
Chloride: 107 mmol/L (ref 101–111)
Glucose, Bld: 87 mg/dL (ref 65–99)
Potassium: 3.7 mmol/L (ref 3.5–5.1)
SODIUM: 144 mmol/L (ref 135–145)

## 2017-09-26 LAB — CBC
HCT: 45.8 % (ref 36.0–46.0)
Hemoglobin: 14.2 g/dL (ref 12.0–15.0)
MCH: 29.1 pg (ref 26.0–34.0)
MCHC: 31 g/dL (ref 30.0–36.0)
MCV: 93.9 fL (ref 78.0–100.0)
PLATELETS: 243 10*3/uL (ref 150–400)
RBC: 4.88 MIL/uL (ref 3.87–5.11)
RDW: 15.5 % (ref 11.5–15.5)
WBC: 7.8 10*3/uL (ref 4.0–10.5)

## 2017-09-26 NOTE — ED Notes (Addendum)
Patient left after triage

## 2017-09-26 NOTE — Telephone Encounter (Signed)
Pt called stating that she is having new dyspnea on exertion and chest pressure "feels like something is sitting on my chest." These symptoms started following her cardioversion on Wed - symptoms were significant yesterday and worse when she goes up steps.  I advised her that with those new symptoms, it is difficult to assess her over the phone and that she should be evaluated in the ER. She expressed understanding of the plan and will come to [facility].

## 2017-09-26 NOTE — ED Triage Notes (Signed)
Patient to ED c/o SOB and chest pressure since Wednesday night - reports she had a cardioversion for A-Fib on Monday. Patient endorses orthopnea and SOB worse with exertion, even minimal. Skin warm/dry, resp e/u. Denies dizziness/lightheadedness, no N/V.

## 2017-09-26 NOTE — Telephone Encounter (Signed)
Beverly Li called because she had been in the emergency room for 3 hours.  She was frustrated by this.  She has had blood drawn, and a chest x-ray.  She has not seen a healthcare professional to let her know what the results of these tests are.  She became worried because she was having shortness of breath, especially dyspnea on exertion and chest pressure.  She contacted our office and came to the emergency room as directed.  She is not currently having chest pressure or shortness of breath and admits that she is not in acute distress.  I discussed the process with her, advising that when the ER is very full, they start treatment by obtaining the labs and the chest x-ray, even when she is in the waiting room.  She was appreciated above the call.  Theodore DemarkRhonda Tirso Laws, PA-C 09/26/2017 8:35 PM Beeper [phone]

## 2017-09-29 NOTE — ED Notes (Signed)
Follow up call made  No answer  [id number]  09/29/17 s Omunique Pederson rn

## 2017-10-02 ENCOUNTER — Encounter: Payer: Self-pay | Admitting: Cardiology

## 2017-10-13 ENCOUNTER — Telehealth: Payer: Self-pay | Admitting: Cardiology

## 2017-10-13 NOTE — Telephone Encounter (Signed)
Spoke with patient who reports waking around 5 am this morning with pressure in her chest and HR between 135-140 bpm.  She reports "I am better now".  She reports HR is now 72 bpm.  Advised to avoid any stimulants, which she denies use of. Advised to call back if rapid rate reoccurs.  Advised if at night or on weekends she can also call MD on call.  Pt was grateful for the call and information.

## 2017-10-13 NOTE — Telephone Encounter (Signed)
New Message   Patient is calling from [place] stating that heart is  135  No emergency room visit in [place]

## 2017-10-13 NOTE — Telephone Encounter (Signed)
Thanks for update °Wanette Robison, MD ° °

## 2017-10-28 ENCOUNTER — Encounter: Payer: Self-pay | Admitting: Cardiology

## 2017-10-28 ENCOUNTER — Ambulatory Visit: Payer: Medicare Other | Admitting: Cardiology

## 2017-10-28 VITALS — BP 146/84 | HR 65 | Ht 65.0 in | Wt 122.0 lb

## 2017-10-28 DIAGNOSIS — I421 Obstructive hypertrophic cardiomyopathy: Secondary | ICD-10-CM

## 2017-10-28 DIAGNOSIS — I4892 Unspecified atrial flutter: Secondary | ICD-10-CM | POA: Diagnosis not present

## 2017-10-28 DIAGNOSIS — I959 Hypotension, unspecified: Secondary | ICD-10-CM | POA: Diagnosis not present

## 2017-10-28 NOTE — Patient Instructions (Signed)
Medication Instructions:  The current medical regimen is effective;  continue present plan and medications.  Follow-Up: Follow up in 3 months with Dr Skains.  Thank you for choosing [facility]!!       

## 2017-10-28 NOTE — Progress Notes (Signed)
Cardiology Office Note   Date:  10/28/2017   ID:  Beverly Li, DOB Jan 01, 1946, MRN [id number]  PCP:  Clayborn Heron, MD  Cardiologist:   Donato Schultz, MD / Graciela Husbands       History of Present Illness: Beverly Li is a 72 y.o. female  with hypertrophic obstructive cardiomyopathy, recent severe exacerbation again post cardiac catheterization and prior during femoropopliteal bypass resulting in  phenylephrine , fluids and prolonged intubation here for follow-up.  She underwent diagnostic heart catheterization on 05/07/16 and post procedure developed severe hypotension, hyper vagal response. Requiring once again phenylephrine, facemask oxygen. She did not require intubation. 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 Hydralazine was given post catheterization wound systolic pressure pressure was 190. Profound hypotension and bradycardia noted. This was treated rapidly, fluids, phenylephrine, heart rates as low as the 40s.  Midodrine started. Phenylephrine eventually weaned off. Diuresis noted. Klonopin was started for anxiety, to hopefully help blunt the hyper adrenergic response. She denies being atypically anxious person however.  Echocardiogram 04/2016 showed EF 65-70% with hypertrophic obstructive cardiomyopathy with LVOT gradient of 23 mm at rest.  She is here for follow-up today and should have appointment set up with Dr. Graciela Husbands to discuss her profound autonomic dysfunction.  In 2014 started having "heart burn" episodes. Debilitating. Out walking dog. By time she would get back, sweat, diahpesis, up neck, nausea then diarrhea. Wiped out for a few hours. Does not feel it now with exercise. Felt 3 hours irreg hb afib kicked in. Afaid to go anywhere. The sweat comes over her hot. Pain. After she described this once again, this is why I proceeded with cardiac catheterization to make sure that with her peripheral vascular disease that she did not have severe  coronary artery disease that was now becoming progressive. Thankfully, she did not have flow limiting disease. Moderate LAD, FFR.  Shortly after cardiac catheterization when her blood pressure increased to the 190 systolic range, she was given hydralazine to reduce her blood pressure and she ended up having a hyper vagal response where her heart rate decreases the 40s, blood pressure plummeted into the 50 systolic, she developed flash pulmonary edema. She was placed in the CCU, phenylephrine. Slowly, this was able to be weaned. Klonopin and Midodrin was started.  This episode post catheterization was very similar to her episode intraoperatively during her femoropopliteal bypass. She was intubated for several days after that procedure and with her arterial line demonstrated classic spike and dome changes intermittently with marked variations in her blood pressure at various points.  09/16/16-since her last visit she has had cardiac MRI and stress echocardiogram. Both were reassuring. She did not drop her blood pressures during exercise. She did not increase her gradient significantly during exercise. We were able to invoke an increase left ventricular output tract gradient with Valsalva maneuver. Please see report. No ventricular arrhythmias. She states that over the last 3 months she's felt the best that she's felt in quite some time. She is drinking sports drinks quite frequently, propel. When she does have low blood pressure usually has a prodrome, very vagal-like response as noted previously.  03/31/17-she had a fall in [place] when walking on cobblestones in a garden with flip-flops during a house store with her girlfriends.  She states that she broke her fall with her lips.  She also broke her arm.  Dr. Cleophas Dunker.  She has not had any severe cardiac symptoms thankfully.  At  one point during the last several months she did have low blood pressure and she took the Midrin which took effect after  approximately 3 hours.  She has been drinking propel.  Doing very well.  06/26/17 - 2 episodes with crashed BP, out for the day. Midodrine then. 185/110 at home.  She took a hydralazine at home and now her blood pressure is 158/90.  We once again went over how challenging it is for her given her hypertrophic cardiomyopathy and previous severe hypotension.  Currently she is not have any chest pain, no strokelike symptoms.  No fevers chills nausea vomiting.  10/28/2017- cardioversion took place in June 2019 setting of 2-1 atrial flutter with rapid ventricular response for approximately 4 days causing symptoms.  Dr. Shirlee Latch performed cardioversion with Dr. Jean Rosenthal as anesthesiologist.  Dr. Graciela Husbands saw her in clinic prior to conversion.  Overall she has been doing quite well.  She did have one other episode at the beach lasting approximately 2 or 3 hours at a heart rate that she says was 125 to 130 bpm.  No syncope, no bleeding, no orthopnea, no PND.  Past Medical History:  Diagnosis Date  . Anxiety   . Arthritis   . Atrial fibrillation (HCC)   . Bell's palsy    when pt. was 72 yrs old, when under stress the left side of face will droop.  [name] GERD (gastroesophageal reflux disease)   . History of kidney stones   . Hypertension   . Hypertrophic cardiomyopathy (HCC)    severe LV basilar hypertrophy witn no evidence of significant outflow tract obstruction, EF 65-70%, mild LAE, mild TR, grade 1a diastolic dysfunction 05/15/10 (Dr. Donato Schultz) (Atrial Septal Hypertrophy pattern)-- Intra-op TEE with dsignificant outflow tract obstruction - AI, MR & TR  . Insomnia   . Mild aortic sclerosis (HCC)   . Osteopenia   . Peripheral vascular disease (HCC)   . Syncope    , Vagal    Past Surgical History:  Procedure Laterality Date  . BREAST ENHANCEMENT SURGERY Bilateral   . CARDIAC CATHETERIZATION N/A 05/07/2016   Procedure: Left Heart Cath and Coronary Angiography;  Surgeon: Kathleene Hazel, MD;  Location:  [facility] INVASIVE CV LAB;  Service: Cardiovascular;  Laterality: N/A;  . CARDIOVERSION N/A 09/24/2017   Procedure: CARDIOVERSION;  Surgeon: Laurey Morale, MD;  Location[facility] ENDOSCOPY;  Service: Cardiovascular;  Laterality: N/A;  . ENDARTERECTOMY FEMORAL Right 03/02/2015   Procedure: ENDARTERECTOMY RIGHT FEMORAL;  Surgeon: Chuck Hint, MD;  Location: [facility] OR;  Service: Vascular;  Laterality: Right;  . FACIAL COSMETIC SURGERY    . FEMORAL-POPLITEAL BYPASS GRAFT Right 03/02/2015   Procedure: BYPASS GRAFT FEMORAL-BELOW KNEE POPLITEAL ARTERY;  Surgeon: Chuck Hint, MD;  Location: [facility] OR;  Service: Vascular;  Laterality: Right;  . HERNIA REPAIR    . INGUINAL HERNIA REPAIR Bilateral   . left ovarian cyst    . PERIPHERAL VASCULAR CATHETERIZATION N/A 01/16/2015   Procedure: Abdominal Aortogram;  Surgeon: Chuck Hint, MD;  Location: [facility] INVASIVE CV LAB;  Service: Cardiovascular;  Laterality: N/A;  . TONSILLECTOMY       Current Outpatient Medications  Medication Sig Dispense Refill  . acetaminophen (TYLENOL) 500 MG tablet Take 1,500 mg by mouth every 6 (six) hours as needed (for pain.).    [name] alendronate (FOSAMAX) 70 MG tablet Take 70 mg by mouth every Monday. Take with a full glass of water on an empty stomach.    [name] aspirin EC 81 MG  tablet Take 81 mg by mouth daily.    . Brimonidine Tartrate (LUMIFY) 0.025 % SOLN Place 1 drop into both eyes daily as needed (for dry eyes).    [name]. buPROPion (WELLBUTRIN XL) 150 MG 24 hr tablet Take 150 mg by mouth daily.    . Calcium-Phosphorus-Vitamin D (CALCIUM/D3 ADULT GUMMIES PO) Take 2 tablets by mouth daily at 2 PM.    . DEXILANT 30 MG capsule Take 30 mg by mouth daily before breakfast.     . diltiazem (CARDIZEM) 30 MG tablet Take 30 mg by mouth as needed (HTN).    [name]. ELIQUIS 5 MG TABS tablet TAKE 1 TABLET(5 MG) BY MOUTH TWICE DAILY 60 tablet 9  . ezetimibe (ZETIA) 10 MG tablet Take 10 mg by mouth daily.    . hydrALAZINE (APRESOLINE) 25 MG  tablet TAKE 1 TABLET BY MOUTH EVERY 8 HOURS AS NEEDED FOR SYSTOLIC BLOOD PRESSURE(TOP NUMBER)>160 270 tablet 0  . midodrine (PROAMATINE) 10 MG tablet Take 10 mg by mouth 3 (three) times daily with meals as needed (for systolic blood pressure lower than 100).  90 tablet 1  . Multiple Vitamins-Iron (STRESS B COMPLEX/IRON) TABS Take 1 tablet by mouth at bedtime.     . nebivolol (BYSTOLIC) 10 MG tablet Take 5 mg by mouth at bedtime.    Bertram Gala. Polyethyl Glycol-Propyl Glycol (SYSTANE) 0.4-0.3 % GEL ophthalmic gel Place 1 application into both eyes daily as needed (for eye irritation/dryness.).    [name]. PROAIR HFA 108 (90 Base) MCG/ACT inhaler Inhale 2 puffs into the lungs 4 (four) times daily as needed (for wheezing/shortness of breath.).   1  . rosuvastatin (CRESTOR) 5 MG tablet Take 2.5 mg by mouth 2 (two) times a week. Take 0.5 tablet (2.5mg ) by mouth on Mondays and Tuesdays.    [name]. zolpidem (AMBIEN) 5 MG tablet Take 5 mg by mouth at bedtime as needed for sleep.      No current facility-administered medications for this visit.     Allergies:   Amoxicillin; Atenolol; Crestor [rosuvastatin calcium]; Pravastatin; Sulfa antibiotics; and Codeine    Social History:  The patient  reports that she quit smoking about 2 years ago. Her smoking use included cigarettes. She has a 50.00 pack-year smoking history. She has never used smokeless tobacco. She reports that she drinks alcohol. She reports that she does not use drugs.   Family History:  The patient's family history includes Breast cancer in her sister; Cancer in her father, mother, and sister; Hypertension in her mother; Liver cancer in her mother; Lung cancer in her father.    ROS:  Please see the history of present illness.  All others negative.   PHYSICAL EXAM: VS:  BP (!) 146/84   Pulse 65   Ht 5\' 5"  (1.651 m)   Wt 122 lb (55.3 kg)   LMP  (LMP Unknown)   SpO2 93%   BMI 20.30 kg/m  , BMI Body mass index is 20.3 kg/m. GEN: Well nourished, well  developed, in no acute distress  HEENT: normal  Neck: no JVD, carotid bruits, or masses Cardiac: RRR; 2/6 SM, rubs, or gallops,no edema  Respiratory:  clear to auscultation bilaterally, normal work of breathing GI: soft, nontender, nondistended, + BS MS: no deformity or atrophy  Skin: warm and dry, no rash Neuro:  Alert and Oriented x 3, Strength and sensation are intact Psych: euthymic mood, full affect     EKG:  EKG ordered today 04/22/16-sinus bradycardia rate 58 with biatrial enlargement, T-wave  inversion noted in 1, aVL, subtle downsloping noted in V6. Note hypertrophic cardiomyopathy. Prior, sinus brady with T-wave changes same as current. Personally viewed.   Recent Labs: 09/26/2017: BUN 20; Creatinine, Ser 0.63; Hemoglobin 14.2; Platelets 243; Potassium 3.7; Sodium 144     Lipid Panel    Component Value Date/Time   CHOL 211 (H) 09/16/2016 1200   TRIG 78 09/16/2016 1200   HDL 97 09/16/2016 1200   CHOLHDL 2.2 09/16/2016 1200   LDLCALC 98 09/16/2016 1200  LDL last checked 03/19/17 108    Wt Readings from Last 3 Encounters:  10/28/17 122 lb (55.3 kg)  09/26/17 125 lb (56.7 kg)  09/24/17 125 lb (56.7 kg)      Other studies Reviewed: Additional studies/ records that were reviewed today include:  Hospital records reviewed, echocardiogram reviewed. Review of the above records demonstrates:  As above   ASSESSMENT AND PLAN:  72 year old female with hypertrophic obstructive cardiomyopathy, moderate coronary artery disease, nonflow limiting LAD lesion by FFR, peripheral vascular disease requiring femoropopliteal bypass, long-standing history of hyper vagal responses.    Hypertrophic obstructive cardiomyopathy/Dyspnea on exertion  -  Avoid dehydration  -  At baseline, outflow tract is not significant  -  Intraoperative. With crossclamping, vasodilatation,   Increased adrenergic drive ,Perfect storm to elucidate obstruction.She was intubated for several days postoperatively  femoropopliteal and hemodynamically post significant troubles trying to extubate given her periodic outflow tract obstruction which was severe.  -  We will try to avoid all elective surgical procedures in the future.  -  No high-risk symptoms such as ventricular tachycardia, early cardiac death in family members , unexplained syncope , septum less than 3 cm. Murmur has been present at baseline.   -  Increased Bystolic to 10 QD (decreased heart rate , decreased inotropic beneficial in her situation) given her blood pressure that was highly elevated.   - She describes hypervagal-like experiences including nausea, vomiting, pallor, diaphoresis with subsequent diarrhea, feeling wiped out for hours. She has been battling this for years. Continue to avoid dehydration. She is drinking propel. She knows to lay down immediately if she begins to feel this.  - Now on midodrine PRN.   - Thankfully the stress echocardiogram did not show any drops in blood pressure with exercise. She did not show any increased gradient during exercise on echocardiogram. She did however when performing Valsalva increase her gradient. Please see report. No ventricular arrhythmias.  - MRI did not show any evidence of SAM, minimal gradient.  -Currently remaining stable, no syncope.  Rare Midodrin use.  Challenging however to control hypertension   -Dyspnea-Prior extensive smoking history- PFTs/pulmonary evaluation, Referred to pulmonary previously. Chest x-ray PA and lateral from 09/19/12 was interpreted as underlying COPD.   Sinus bradycardia   - She takes hydralazine if her blood pressure gets over 160 occasionally. She takes midodrine if it drops significantly but she has not taken this recently.  Stable.    Paroxysmal atrial fibrillation  -Had episode requiring cardioversion in June 2019.  Felt sluggish, weak, short of breath.  Successful cardioversion on propofol.  She did not have any hemodynamic instability.  -  Continuing with  anticoagulation, Eliquis  -  No longer on amiodarone.  -  If symptoms arise/worsen, may need to resume antiarrhythmic therapy as HOCM patients do not tolerate atrial fibrillation well.  -  Postoperative,  seems to be maintaining sinus rhythm well.  Anxiety  - would be ok to use low dose Xanax. Dr. Barbaraann Barthel.  This was previously discussed with Dr. Gala Romney.  No changes.   Statin intolerance  -  Did not tolerate Crestor well. When raising her hands above her head her shoulders ached. Since she is stopped, she feels better. Given her peripheral vascular disease we would like her on statin therapy. She wonders if her hip pain or bursitis with statin related. However, she was holding her grandchild on that hip for quite some time.  -  Did not tolerate pravastatin 20 mg once a day either. Her legs bother her significant only.  - Appreciate help with lipid clinic for further discussion, Zetia may be option.  Essential hypertension, labile hypertension -Bystolic 10. Challenging.  Understands stroke risks however understands risks of cardiovascular collapse given her swings.  Disposition:   FU with Sallyann Kinnaird in 3 months.  Signed, Donato Schultz, MD  10/28/2017 3:05 PM    [facility] [address] [place], [place], [place]  [address] Phone: [phone]; Fax: [phone]

## 2017-11-17 ENCOUNTER — Ambulatory Visit: Payer: Medicare Other | Admitting: Family

## 2017-11-17 ENCOUNTER — Telehealth: Payer: Self-pay | Admitting: Pharmacist

## 2017-11-17 ENCOUNTER — Ambulatory Visit (HOSPITAL_COMMUNITY): Payer: Medicare Other

## 2017-11-17 ENCOUNTER — Ambulatory Visit (HOSPITAL_BASED_OUTPATIENT_CLINIC_OR_DEPARTMENT_OTHER)
Admission: RE | Admit: 2017-11-17 | Discharge: 2017-11-17 | Disposition: A | Discharge status: Home / Self Care | Payer: Medicare Other | Source: Ambulatory Visit | Attending: Nurse Practitioner | Admitting: Nurse Practitioner

## 2017-11-17 ENCOUNTER — Encounter (HOSPITAL_COMMUNITY): Payer: Self-pay | Admitting: Nurse Practitioner

## 2017-11-17 ENCOUNTER — Telehealth: Payer: Self-pay | Admitting: Cardiology

## 2017-11-17 VITALS — BP 96/64 | HR 106 | Ht 65.0 in | Wt 121.0 lb

## 2017-11-17 DIAGNOSIS — I119 Hypertensive heart disease without heart failure: Secondary | ICD-10-CM | POA: Insufficient documentation

## 2017-11-17 DIAGNOSIS — Z7901 Long term (current) use of anticoagulants: Secondary | ICD-10-CM | POA: Insufficient documentation

## 2017-11-17 DIAGNOSIS — F419 Anxiety disorder, unspecified: Secondary | ICD-10-CM

## 2017-11-17 DIAGNOSIS — G4700 Insomnia, unspecified: Secondary | ICD-10-CM

## 2017-11-17 DIAGNOSIS — M8580 Other specified disorders of bone density and structure, unspecified site: Secondary | ICD-10-CM

## 2017-11-17 DIAGNOSIS — Z79899 Other long term (current) drug therapy: Secondary | ICD-10-CM | POA: Insufficient documentation

## 2017-11-17 DIAGNOSIS — Z882 Allergy status to sulfonamides status: Secondary | ICD-10-CM | POA: Insufficient documentation

## 2017-11-17 DIAGNOSIS — Z7982 Long term (current) use of aspirin: Secondary | ICD-10-CM

## 2017-11-17 DIAGNOSIS — Z880 Allergy status to penicillin: Secondary | ICD-10-CM | POA: Insufficient documentation

## 2017-11-17 DIAGNOSIS — Z87891 Personal history of nicotine dependence: Secondary | ICD-10-CM

## 2017-11-17 DIAGNOSIS — G629 Polyneuropathy, unspecified: Secondary | ICD-10-CM

## 2017-11-17 DIAGNOSIS — Z885 Allergy status to narcotic agent status: Secondary | ICD-10-CM | POA: Insufficient documentation

## 2017-11-17 DIAGNOSIS — I444 Left anterior fascicular block: Secondary | ICD-10-CM | POA: Insufficient documentation

## 2017-11-17 DIAGNOSIS — R000 Tachycardia, unspecified: Secondary | ICD-10-CM | POA: Insufficient documentation

## 2017-11-17 DIAGNOSIS — I739 Peripheral vascular disease, unspecified: Secondary | ICD-10-CM | POA: Insufficient documentation

## 2017-11-17 DIAGNOSIS — Z8249 Family history of ischemic heart disease and other diseases of the circulatory system: Secondary | ICD-10-CM | POA: Insufficient documentation

## 2017-11-17 DIAGNOSIS — K219 Gastro-esophageal reflux disease without esophagitis: Secondary | ICD-10-CM

## 2017-11-17 DIAGNOSIS — I4891 Unspecified atrial fibrillation: Secondary | ICD-10-CM

## 2017-11-17 DIAGNOSIS — I440 Atrioventricular block, first degree: Secondary | ICD-10-CM

## 2017-11-17 DIAGNOSIS — I422 Other hypertrophic cardiomyopathy: Secondary | ICD-10-CM | POA: Insufficient documentation

## 2017-11-17 DIAGNOSIS — I4892 Unspecified atrial flutter: Secondary | ICD-10-CM | POA: Insufficient documentation

## 2017-11-17 LAB — BASIC METABOLIC PANEL
Anion gap: 10 (ref 5–15)
BUN: 30 mg/dL — AB (ref 8–23)
CHLORIDE: 104 mmol/L (ref 98–111)
CO2: 28 mmol/L (ref 22–32)
CREATININE: 0.77 mg/dL (ref 0.44–1.00)
Calcium: 9.5 mg/dL (ref 8.9–10.3)
GFR calc Af Amer: >60 mL/min (ref >60)
GFR calc non Af Amer: >60 mL/min (ref >60)
Glucose, Bld: 120 mg/dL — ABNORMAL HIGH (ref 70–99)
Potassium: 4 mmol/L (ref 3.5–5.1)
SODIUM: 142 mmol/L (ref 135–145)

## 2017-11-17 LAB — MAGNESIUM: MAGNESIUM: 2.2 mg/dL (ref 1.7–2.4)

## 2017-11-17 NOTE — Telephone Encounter (Signed)
Spoke with pt who states her heart rate is in the 130s and she is not feeling well. She believes she is back in AF or Flutter. I have arranged an appointment for her in the AF clinic for evaluation today. Pt was given parking instructions and code. She has no additional questions.

## 2017-11-17 NOTE — Telephone Encounter (Signed)
STAT if HR is under 50 or over 120 (normal HR is 60-100 beats per minute)  1) What is your heart rate? 105 and 120- woke her up this morning, making her feel really badu 2) Do you have a log of your heart rate readings (document readings)? Yes- generally it is in the sixties  3) Do you have any other symptoms? Feel nausea and flush

## 2017-11-17 NOTE — Telephone Encounter (Signed)
Medication list reviewed in anticipation of upcoming Tikosyn initiation. Patient is not taking any contraindicated or QTc prolonging medications per current medication list. Pt potassium and mag ok on panel today.   Patient is anticoagulated on Eliquis on the appropriate dose. Please ensure that patient has not missed any anticoagulation doses in the 3 weeks prior to Tikosyn initiation.   Patient will need to be counseled to avoid use of Benadryl while on Tikosyn and in the 2-3 days prior to Tikosyn initiation.

## 2017-11-17 NOTE — Telephone Encounter (Signed)
Saw her with Lupita LeashDonna in AFIB clinic Starting Tikosyn tomorrow. Discussed via phone with Dr. Elberta Fortisamnitz.   Donato SchultzMark Genoveva Singleton, MD

## 2017-11-18 ENCOUNTER — Other Ambulatory Visit: Payer: Self-pay

## 2017-11-18 ENCOUNTER — Ambulatory Visit (HOSPITAL_COMMUNITY)
Admission: RE | Admit: 2017-11-18 | Discharge: 2017-11-18 | Disposition: A | Discharge status: Home / Self Care | Payer: Medicare Other | Source: Ambulatory Visit | Attending: Nurse Practitioner | Admitting: Nurse Practitioner

## 2017-11-18 ENCOUNTER — Inpatient Hospital Stay (HOSPITAL_COMMUNITY)
Admission: AD | Admit: 2017-11-18 | Discharge: 2017-11-21 | DRG: 310 | Disposition: A | Discharge status: Home / Self Care | Payer: Medicare Other | Source: Ambulatory Visit | Attending: Internal Medicine | Admitting: Internal Medicine

## 2017-11-18 ENCOUNTER — Encounter (HOSPITAL_COMMUNITY): Payer: Self-pay | Admitting: General Practice

## 2017-11-18 ENCOUNTER — Encounter (HOSPITAL_COMMUNITY): Payer: Self-pay | Admitting: Nurse Practitioner

## 2017-11-18 ENCOUNTER — Other Ambulatory Visit (HOSPITAL_COMMUNITY): Payer: Self-pay | Admitting: Nurse Practitioner

## 2017-11-18 VITALS — BP 126/82 | HR 62 | Ht 65.0 in | Wt 123.0 lb

## 2017-11-18 DIAGNOSIS — Z5181 Encounter for therapeutic drug level monitoring: Secondary | ICD-10-CM | POA: Diagnosis not present

## 2017-11-18 DIAGNOSIS — I739 Peripheral vascular disease, unspecified: Secondary | ICD-10-CM | POA: Diagnosis present

## 2017-11-18 DIAGNOSIS — Z881 Allergy status to other antibiotic agents status: Secondary | ICD-10-CM

## 2017-11-18 DIAGNOSIS — I959 Hypotension, unspecified: Secondary | ICD-10-CM | POA: Diagnosis present

## 2017-11-18 DIAGNOSIS — Z79899 Other long term (current) drug therapy: Secondary | ICD-10-CM | POA: Diagnosis not present

## 2017-11-18 DIAGNOSIS — I480 Paroxysmal atrial fibrillation: Principal | ICD-10-CM | POA: Diagnosis present

## 2017-11-18 DIAGNOSIS — Z882 Allergy status to sulfonamides status: Secondary | ICD-10-CM

## 2017-11-18 DIAGNOSIS — F419 Anxiety disorder, unspecified: Secondary | ICD-10-CM | POA: Diagnosis present

## 2017-11-18 DIAGNOSIS — Z87891 Personal history of nicotine dependence: Secondary | ICD-10-CM

## 2017-11-18 DIAGNOSIS — I10 Essential (primary) hypertension: Secondary | ICD-10-CM | POA: Diagnosis present

## 2017-11-18 DIAGNOSIS — K219 Gastro-esophageal reflux disease without esophagitis: Secondary | ICD-10-CM | POA: Diagnosis present

## 2017-11-18 DIAGNOSIS — I481 Persistent atrial fibrillation: Secondary | ICD-10-CM | POA: Diagnosis not present

## 2017-11-18 DIAGNOSIS — Z7982 Long term (current) use of aspirin: Secondary | ICD-10-CM | POA: Diagnosis not present

## 2017-11-18 DIAGNOSIS — Z7901 Long term (current) use of anticoagulants: Secondary | ICD-10-CM

## 2017-11-18 DIAGNOSIS — Z885 Allergy status to narcotic agent status: Secondary | ICD-10-CM | POA: Diagnosis not present

## 2017-11-18 DIAGNOSIS — Z888 Allergy status to other drugs, medicaments and biological substances status: Secondary | ICD-10-CM | POA: Diagnosis not present

## 2017-11-18 DIAGNOSIS — I484 Atypical atrial flutter: Secondary | ICD-10-CM

## 2017-11-18 DIAGNOSIS — I421 Obstructive hypertrophic cardiomyopathy: Secondary | ICD-10-CM | POA: Diagnosis present

## 2017-11-18 DIAGNOSIS — G629 Polyneuropathy, unspecified: Secondary | ICD-10-CM | POA: Diagnosis present

## 2017-11-18 HISTORY — DX: Other complications of anesthesia, initial encounter: T88.59XA

## 2017-11-18 HISTORY — DX: Adverse effect of unspecified anesthetic, initial encounter: T41.45XA

## 2017-11-18 HISTORY — DX: Cardiac murmur, unspecified: R01.1

## 2017-11-18 LAB — BASIC METABOLIC PANEL
Anion gap: 10 (ref 5–15)
Anion gap: 8 (ref 5–15)
BUN: 28 mg/dL — AB (ref 8–23)
BUN: 26 mg/dL — AB (ref 8–23)
CHLORIDE: 105 mmol/L (ref 98–111)
CO2: 28 mmol/L (ref 22–32)
CO2: 26 mmol/L (ref 22–32)
CREATININE: 0.79 mg/dL (ref 0.44–1.00)
Calcium: 9.6 mg/dL (ref 8.9–10.3)
Calcium: 9.3 mg/dL (ref 8.9–10.3)
Chloride: 106 mmol/L (ref 98–111)
Creatinine, Ser: 0.83 mg/dL (ref 0.44–1.00)
GFR calc Af Amer: >60 mL/min (ref >60)
GFR calc Af Amer: >60 mL/min (ref >60)
GFR calc non Af Amer: >60 mL/min (ref >60)
GFR calc non Af Amer: >60 mL/min (ref >60)
Glucose, Bld: 104 mg/dL — ABNORMAL HIGH (ref 70–99)
Glucose, Bld: 107 mg/dL — ABNORMAL HIGH (ref 70–99)
POTASSIUM: 3.7 mmol/L (ref 3.5–5.1)
Potassium: 4.3 mmol/L (ref 3.5–5.1)
SODIUM: 139 mmol/L (ref 135–145)
Sodium: 144 mmol/L (ref 135–145)

## 2017-11-18 LAB — MAGNESIUM: Magnesium: 2 mg/dL (ref 1.7–2.4)

## 2017-11-18 MED ORDER — ALBUTEROL SULFATE (2.5 MG/3ML) 0.083% IN NEBU: 2.5000 mg | INHALATION_SOLUTION | Freq: Four times a day (QID) | RESPIRATORY_TRACT | Status: DC | PRN

## 2017-11-18 MED ORDER — ZOLPIDEM TARTRATE 5 MG PO TABS
5.0000 mg | ORAL_TABLET | Freq: Every day | ORAL | Status: DC
  Administered 2017-11-18 – 2017-11-20 (×3): 5 mg via ORAL
  Filled 2017-11-18 (×3): qty 1

## 2017-11-18 MED ORDER — MIDODRINE HCL 5 MG PO TABS: 10.0000 mg | ORAL_TABLET | Freq: Three times a day (TID) | ORAL | Status: DC | PRN

## 2017-11-18 MED ORDER — DILTIAZEM HCL 60 MG PO TABS
30.0000 mg | ORAL_TABLET | Freq: Every day | ORAL | Status: DC | PRN
  Filled 2017-11-18: qty 1

## 2017-11-18 MED ORDER — NEBIVOLOL HCL 5 MG PO TABS
5.0000 mg | ORAL_TABLET | Freq: Every day | ORAL | Status: DC
  Administered 2017-11-18 – 2017-11-20 (×3): 5 mg via ORAL
  Filled 2017-11-18 (×3): qty 1

## 2017-11-18 MED ORDER — SODIUM CHLORIDE 0.9% FLUSH
3.0000 mL | INTRAVENOUS | Status: DC | PRN
  Administered 2017-11-20: 3 mL via INTRAVENOUS
  Filled 2017-11-18: qty 3

## 2017-11-18 MED ORDER — DEXLANSOPRAZOLE 30 MG PO CPDR
30.0000 mg | DELAYED_RELEASE_CAPSULE | Freq: Every day | ORAL | Status: DC
  Administered 2017-11-19 – 2017-11-21 (×3): 30 mg via ORAL
  Filled 2017-11-18 (×3): qty 1

## 2017-11-18 MED ORDER — EZETIMIBE 10 MG PO TABS
10.0000 mg | ORAL_TABLET | Freq: Every day | ORAL | Status: DC
  Administered 2017-11-19 – 2017-11-21 (×3): 10 mg via ORAL
  Filled 2017-11-18 (×3): qty 1

## 2017-11-18 MED ORDER — POTASSIUM CHLORIDE CRYS ER 20 MEQ PO TBCR: 40.0000 meq | EXTENDED_RELEASE_TABLET | Freq: Once | ORAL | 0 refills | Status: DC

## 2017-11-18 MED ORDER — SODIUM CHLORIDE 0.9% FLUSH
3.0000 mL | Freq: Two times a day (BID) | INTRAVENOUS | Status: DC
  Administered 2017-11-19 – 2017-11-21 (×4): 3 mL via INTRAVENOUS

## 2017-11-18 MED ORDER — ROSUVASTATIN CALCIUM 5 MG PO TABS
2.5000 mg | ORAL_TABLET | ORAL | Status: DC
  Administered 2017-11-18 – 2017-11-20 (×3): 2.5 mg via ORAL
  Filled 2017-11-18 (×3): qty 0.5

## 2017-11-18 MED ORDER — DOFETILIDE 250 MCG PO CAPS
250.0000 ug | ORAL_CAPSULE | Freq: Two times a day (BID) | ORAL | Status: DC
  Administered 2017-11-18: 250 ug via ORAL
  Filled 2017-11-18: qty 1

## 2017-11-18 MED ORDER — HYDRALAZINE HCL 25 MG PO TABS
25.0000 mg | ORAL_TABLET | Freq: Three times a day (TID) | ORAL | Status: DC | PRN
  Administered 2017-11-18 – 2017-11-21 (×2): 25 mg via ORAL
  Filled 2017-11-18 (×2): qty 1

## 2017-11-18 MED ORDER — SODIUM CHLORIDE 0.9 % IV SOLN: 250.0000 mL | INTRAVENOUS | Status: DC | PRN

## 2017-11-18 MED ORDER — APIXABAN 5 MG PO TABS
5.0000 mg | ORAL_TABLET | Freq: Two times a day (BID) | ORAL | Status: DC
  Administered 2017-11-18 – 2017-11-21 (×6): 5 mg via ORAL
  Filled 2017-11-18 (×6): qty 1

## 2017-11-18 MED ORDER — ASPIRIN EC 81 MG PO TBEC
81.0000 mg | DELAYED_RELEASE_TABLET | Freq: Every day | ORAL | Status: DC
  Administered 2017-11-19 – 2017-11-21 (×3): 81 mg via ORAL
  Filled 2017-11-18 (×3): qty 1

## 2017-11-18 MED ORDER — BUPROPION HCL ER (XL) 150 MG PO TB24
150.0000 mg | ORAL_TABLET | Freq: Every morning | ORAL | Status: DC
  Administered 2017-11-19 – 2017-11-21 (×3): 150 mg via ORAL
  Filled 2017-11-18 (×3): qty 1

## 2017-11-18 NOTE — Progress Notes (Signed)
Primary Care Physician: Clayborn Heron, MD Cardiologist: Dr. Anne Fu  EP: Dr. Graciela Husbands Referring: Elesa Hacker street triage    Beverly Li is a 72 y.o. female with a h/o HOCM, symptomatic brady with syncope,atrial flutter, s/p successful  cardioversion in June, with recurrent arrhythmia as of  this am.Ekg shows atypical atrial  flutter vrs atrial  tach at 106 bpm. When it first started this am , she had symptomatic RVR and felt poorly. Her BP is soft at 96/64 and has h/o of autonomic dysfunction with liable HTN and significant issues with past sedation and drops  in BP.  She has a history of labile hypertension and during vascular procedure 11/16 she developed hypotension and suffered a respiratory arrest.  She underwent intubation and phenylephrine infusion.  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  1/18 she underwent catheterization for chest pain.  No coronary disease was noted but she had significant postprocedural hypertension and received IV hydralazine.  She became profoundly bradycardic and hypotensive again requiring Neo-Synephrine.  She developed respiratory distress but did not require intubation.  She has been on amiodarone in the remote past but was stopped secondary to potential side effects and currently pt suffers from peripheral neuropathy and does not want to use this drug again. When seen initially by Dr. Graciela Husbands in June,  he set up cardioversion with special attention to anesthesia, but did not mention in his note, future plans if pt should go back to aflutter.  Since it seems that I will not be able to add rate control for pt's low BP, Dr. Julieanne Cotton, being on vacation, I dicussed with Dr. Anne Fu, he  was able to come to the office and discuss with the pt as well. He feels that she needs to be on antiarrythmic, he  discussed as well with Dr. Elberta Fortis. It was then discussed with pt pursing admission for Tikosyn load and she is in agreement.   She does not drink  alcohol, use caffeine. Was in usual stare of health when this started, no obvious trigger.  F/u in afib clinic 8/6 for Tikosyn admit. She did convert to SR last night. Feels well today. Is aware of price of drug and is ready to come into hospital for drug. PharmD screened drugs and no qt prolonging drugs on board.   Today, she denies symptoms of palpitations, chest pain, shortness of breath, orthopnea, PND, lower extremity edema, dizziness, presyncope, syncope, or neurologic sequela. The patient is tolerating medications without difficulties and is otherwise without complaint today.   Past Medical History:  Diagnosis Date  . Anxiety   . Arthritis   . Atrial fibrillation (HCC)   . Bell's palsy    when pt. was 72 yrs old, when under stress the left side of face will droop.  [name] GERD (gastroesophageal reflux disease)   . History of kidney stones   . Hypertension   . Hypertrophic cardiomyopathy (HCC)    severe LV basilar hypertrophy witn no evidence of significant outflow tract obstruction, EF 65-70%, mild LAE, mild TR, grade 1a diastolic dysfunction 05/15/10 (Dr. Donato Schultz) (Atrial Septal Hypertrophy pattern)-- Intra-op TEE with dsignificant outflow tract obstruction - AI, MR & TR  . Insomnia   . Mild aortic sclerosis (HCC)   . Osteopenia   . Peripheral vascular disease (HCC)   . Syncope    , Vagal   Past Surgical History:  Procedure Laterality Date  . BREAST ENHANCEMENT SURGERY Bilateral   . CARDIAC CATHETERIZATION N/A 05/07/2016  Procedure: Left Heart Cath and Coronary Angiography;  Surgeon: Kathleene Hazel, MD;  Location: [facility] INVASIVE CV LAB;  Service: Cardiovascular;  Laterality: N/A;  . CARDIOVERSION N/A 09/24/2017   Procedure: CARDIOVERSION;  Surgeon: Laurey Morale, MD;  Location: [facility] ENDOSCOPY;  Service: Cardiovascular;  Laterality: N/A;  . ENDARTERECTOMY FEMORAL Right 03/02/2015   Procedure: ENDARTERECTOMY RIGHT FEMORAL;  Surgeon: Chuck Hint, MD;  Location: [facility]  OR;  Service: Vascular;  Laterality: Right;  . FACIAL COSMETIC SURGERY    . FEMORAL-POPLITEAL BYPASS GRAFT Right 03/02/2015   Procedure: BYPASS GRAFT FEMORAL-BELOW KNEE POPLITEAL ARTERY;  Surgeon: Chuck Hint, MD;  Location[facility] OR;  Service: Vascular;  Laterality: Right;  . HERNIA REPAIR    . INGUINAL HERNIA REPAIR Bilateral   . left ovarian cyst    . PERIPHERAL VASCULAR CATHETERIZATION N/A 01/16/2015   Procedure: Abdominal Aortogram;  Surgeon: Chuck Hint, MD;  Location: [facility] INVASIVE CV LAB;  Service: Cardiovascular;  Laterality: N/A;  . TONSILLECTOMY      Current Outpatient Medications  Medication Sig Dispense Refill  . acetaminophen (TYLENOL) 500 MG tablet Take 1,500 mg by mouth every 6 (six) hours as needed (for pain.).    [name] alendronate (FOSAMAX) 70 MG tablet Take 70 mg by mouth every Monday. Take with a full glass of water on an empty stomach.    [name] aspirin EC 81 MG tablet Take 81 mg by mouth daily.    . Brimonidine Tartrate (LUMIFY) 0.025 % SOLN Place 1 drop into both eyes daily as needed (for dry eyes).    [name] buPROPion (WELLBUTRIN XL) 150 MG 24 hr tablet Take 150 mg by mouth daily.    . Calcium-Phosphorus-Vitamin D (CALCIUM/D3 ADULT GUMMIES PO) Take 2 tablets by mouth daily at 2 PM.    . DEXILANT 30 MG capsule Take 30 mg by mouth daily before breakfast.     . diltiazem (CARDIZEM) 30 MG tablet Take 30 mg by mouth as needed (HTN).    [name] ELIQUIS 5 MG TABS tablet TAKE 1 TABLET(5 MG) BY MOUTH TWICE DAILY 60 tablet 9  . ezetimibe (ZETIA) 10 MG tablet Take 10 mg by mouth daily.    . hydrALAZINE (APRESOLINE) 25 MG tablet TAKE 1 TABLET BY MOUTH EVERY 8 HOURS AS NEEDED FOR SYSTOLIC BLOOD PRESSURE(TOP NUMBER)>160 270 tablet 0  . midodrine (PROAMATINE) 10 MG tablet Take 10 mg by mouth 3 (three) times daily with meals as needed (for systolic blood pressure lower than 100).  90 tablet 1  . Multiple Vitamins-Iron (STRESS B COMPLEX/IRON) TABS Take 1 tablet by mouth at bedtime.     .  nebivolol (BYSTOLIC) 10 MG tablet Take 5 mg by mouth at bedtime.    Bertram Gala Glycol-Propyl Glycol (SYSTANE) 0.4-0.3 % GEL ophthalmic gel Place 1 application into both eyes daily as needed (for eye irritation/dryness.).    [name] PROAIR HFA 108 (90 Base) MCG/ACT inhaler Inhale 2 puffs into the lungs 4 (four) times daily as needed (for wheezing/shortness of breath.).   1  . rosuvastatin (CRESTOR) 5 MG tablet Take 2.5 mg by mouth 2 (two) times a week. Take 0.5 tablet (2.5mg ) by mouth on Mondays and Tuesdays.    [name] zolpidem (AMBIEN) 5 MG tablet Take 5 mg by mouth at bedtime as needed for sleep.     . potassium chloride SA (K-DUR,KLOR-CON) 20 MEQ tablet Take 2 tablets (40 mEq total) by mouth once for 1 dose. 2 tablet 0   No current facility-administered medications for this  encounter.     Allergies  Allergen Reactions  . Amoxicillin Other (See Comments)    UTI Has patient had a PCN reaction causing immediate rash, facial/tongue/throat swelling, SOB or lightheadedness with hypotension: No Has patient had a PCN reaction causing severe rash involving mucus membranes or skin necrosis: No Has patient had a PCN reaction that required hospitalization: No Has patient had a PCN reaction occurring within the last 10 years: Yes--UTI ONLY If all of the above answers are "NO", then may proceed with Cephalosporin use.   . Atenolol Cough  . Crestor [Rosuvastatin Calcium] Other (See Comments)    Muscle aches  . Pravastatin Other (See Comments)    Muscle aches  . Sulfa Antibiotics Nausea Only  . Codeine Rash    Social History   Socioeconomic History  . Marital status: Married    Spouse name: Not on file  . Number of children: Not on file  . Years of education: Not on file  . Highest education level: Not on file  Occupational History  . Not on file  Social Needs  . Financial resource strain: Not on file  . Food insecurity:    Worry: Not on file    Inability: Not on file  . Transportation needs:     Medical: Not on file    Non-medical: Not on file  Tobacco Use  . Smoking status: Former Smoker    Packs/day: 1.00    Years: 50.00    Pack years: 50.00    Types: Cigarettes    Last attempt to quit: 12/15/2014    Years since quitting: 2.9  . Smokeless tobacco: Never Used  Substance and Sexual Activity  . Alcohol use: Yes    Comment: 1-2 per week  . Drug use: No  . Sexual activity: Not on file  Lifestyle  . Physical activity:    Days per week: Not on file    Minutes per session: Not on file  . Stress: Not on file  Relationships  . Social connections:    Talks on phone: Not on file    Gets together: Not on file    Attends religious service: Not on file    Active member of club or organization: Not on file    Attends meetings of clubs or organizations: Not on file    Relationship status: Not on file  . Intimate partner violence:    Fear of current or ex partner: Not on file    Emotionally abused: Not on file    Physically abused: Not on file    Forced sexual activity: Not on file  Other Topics Concern  . Not on file  Social History Narrative  . Not on file    Family History  Problem Relation Age of Onset  . Liver cancer Mother   . Cancer Mother        Liver  . Hypertension Mother   . Lung cancer Father   . Cancer Father        Lung  . Breast cancer Sister   . Cancer Sister        Breast    ROS- All systems are reviewed and negative except as per the HPI above  Physical Exam: Vitals:   11/18/17 1110  BP: 126/82  Pulse: 62  Weight: 123 lb (55.8 kg)  Height: 5\' 5"  (1.651 m)   Wt Readings from Last 3 Encounters:  11/18/17 123 lb (55.8 kg)  11/17/17 121 lb (54.9 kg)  10/28/17 122  lb (55.3 kg)    Labs: Lab Results  Component Value Date   NA 144 11/18/2017   K 3.7 11/18/2017   CL 106 11/18/2017   CO2 28 11/18/2017   GLUCOSE 104 (H) 11/18/2017   BUN 28 (H) 11/18/2017   CREATININE 0.79 11/18/2017   CALCIUM 9.6 11/18/2017   PHOS 3.2 03/14/2015   MG 2.0  11/18/2017   Lab Results  Component Value Date   INR 1.0 04/30/2016   Lab Results  Component Value Date   CHOL 211 (H) 09/16/2016   HDL 97 09/16/2016   LDLCALC 98 09/16/2016   TRIG 78 09/16/2016     GEN- The patient is well appearing, alert and oriented x 3 today.   Head- normocephalic, atraumatic Eyes-  Sclera clear, conjunctiva pink Ears- hearing intact Oropharynx- clear Neck- supple, no JVP Lymph- no cervical lymphadenopathy Lungs- Clear to ausculation bilaterally, normal work of breathing Heart- Regular rate and rhythm, no murmurs, rubs or gallops, PMI not laterally displaced GI- soft, NT, ND, + BS Extremities- no clubbing, cyanosis, or edema MS- no significant deformity or atrophy Skin- no rash or lesion Psych- euthymic mood, full affect Neuro- strength and sensation are intact  EKG-Probable atypical atrial flutter vrs atrial tach at 106 bpm    Assessment and Plan: 1. Atrial flutter Per HPI, is thought that pt would be best treated with an antiarrythmic as she cannot tolerate rate control for h/o bradycardia and BP issues,   Used amiodarone in the remote past and prefers not to restart for potential side effects as well as current issues with peripheral neuropathy Dr. Anne FuSkains discussed with her use ofTikosyn and she is in agreement Pt states no missed doses of anticoagulation No benadryl for the last 3 days Pharm D to screened drugs, but no qt prolonging drugs seen Qtc today is acceptable at 444 ms Bmet/mag were in range for Tikosyn yesterday, today K+ at 3.7, she will take K+ 40 meq po x one today, while  awaiting bed Price discussed with pt and she states will  not be an issue  Lupita LeashDonna C. Matthew Folksarroll, ANP-C [facility] [address] [place], [place] [id number] [phone]

## 2017-11-18 NOTE — Progress Notes (Signed)
Pharmacy Review for Dofetilide (Tikosyn) Initiation  Admit Complaint: 72 y.o. female admitted 11/18/2017 with atrial fibrillation to be initiated on dofetilide.   Assessment:  Patient Exclusion Criteria: If any screening criteria checked as "Yes", then  patient  should NOT receive dofetilide until criteria item is corrected. If "Yes" please indicate correction plan.  YES  NO Patient  Exclusion Criteria Correction Plan  []  [x]  Baseline QTc interval is greater than or equal to 440 msec. IF above YES box checked dofetilide contraindicated unless patient has ICD; then may proceed if QTc 500-550 msec or with known ventricular conduction abnormalities may proceed with QTc 550-600 msec. QTc = 0.41  QTc 444   []  [x]  Magnesium level is less than 1.8 mEq/l : Last magnesium:  Lab Results  Component Value Date   MG 2.0 11/18/2017         []  [x]  Potassium level is less than 4 mEq/l : Last potassium:  Lab Results  Component Value Date   K 4.3 11/18/2017         []  [x]  Patient is known or suspected to have a digoxin level greater than 2 ng/ml: No results found for: DIGOXIN    []  [x]  Creatinine clearance less than 20 ml/min (calculated using Cockcroft-Gault, actual body weight and serum creatinine): Estimated Creatinine Clearance: 54 mL/min (by C-G formula based on SCr of 0.83 mg/dL).  CrCl 56 using TBW   []  [x]  Patient has received drugs known to prolong the QT intervals within the last 48 hours (phenothiazines, tricyclics or tetracyclic antidepressants, erythromycin, H-1 antihistamines, cisapride, fluoroquinolones, azithromycin). Drugs not listed above may have an, as yet, undetected potential to prolong the QT interval, updated information on QT prolonging agents is available at this website:QT prolonging agents   []  [x]  Patient received a dose of hydrochlorothiazide (Oretic) alone or in any combination including triamterene (Dyazide, Maxzide) in the last 48 hours.   []  [x]  Patient received a  medication known to increase dofetilide plasma concentrations prior to initial dofetilide dose:  . Trimethoprim (Primsol, Proloprim) in the last 36 hours . Verapamil (Calan, Verelan) in the last 36 hours or a sustained release dose in the last 72 hours . Megestrol (Megace) in the last 5 days  . Cimetidine (Tagamet) in the last 6 hours . Ketoconazole (Nizoral) in the last 24 hours . Itraconazole (Sporanox) in the last 48 hours  . Prochlorperazine (Compazine) in the last 36 hours    []  [x]  Patient is known to have a history of torsades de pointes; congenital or acquired long QT syndromes.   []  [x]  Patient has received a Class 1 antiarrhythmic with less than 2 half-lives since last dose. (Disopyramide, Quinidine, Procainamide, Lidocaine, Mexiletine, Flecainide, Propafenone)   []  [x]  Patient has received amiodarone therapy in the past 3 months or amiodarone level is greater than 0.3 ng/ml.    Patient has been appropriately anticoagulated with 5mg  PO BID.  Ordering provider was confirmed at [email or web address] if they are not listed on the [facility] Authorized Prescribers list.  Goal of Therapy: Follow renal function, electrolytes, potential drug interactions, and dose adjustment. Provide education and 1 week supply at discharge.  Plan:  [x]   Physician selected initial dose within range recommended for patients level of renal function - will monitor for response.  []   Physician selected initial dose outside of range recommended for patients level of renal function - will discuss if the dose should be altered at this time.   Select One Calculated CrCl  Dose q12h  []  >  60 ml/min 500 mcg  [x]  40-60 ml/min 250 mcg  []  20-40 ml/min 125 mcg   2. Follow up QTc after the first 5 doses, renal function, electrolytes (K & Mg) daily x 3 days, dose adjustment, success of initiation and facilitate 1 week discharge supply as clinically indicated.  3. Initiate Tikosyn education video (Call [phone] and ask for  video # [phone]).  4. Place Enrollment Form on the chart for discharge supply of dofetilide.   Brandon MelnickHammons, Shanaiya Bene Ballard 10:42 PM 11/18/2017

## 2017-11-18 NOTE — H&P (Signed)
Primary Care Physician: Clayborn Heron, MD Cardiologist: Dr. Anne Fu  EP: Dr. Graciela Husbands Referring: Elesa Hacker street triage    Beverly Li is a 72 y.o. female with a h/o HOCM, symptomatic brady with syncope,atrial flutter, s/p successful  cardioversion in June, with recurrent arrhythmia as of  this am.Ekg shows atypical atrial  flutter vrs atrial  tach at 106 bpm. When it first started this am , she had symptomatic RVR and felt poorly. Her BP is soft at 96/64 and has h/o of autonomic dysfunction with liable HTN and significant issues with past sedation and drops  in BP.  She has a history of labile hypertension and during vascular procedure 11/16 she developed hypotension and suffered a respiratory arrest. She underwent intubation and phenylephrine infusion. 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  1/18 she underwent catheterization for chest pain. No coronary disease was noted but she had significant postprocedural hypertension and received IV hydralazine. She became profoundly bradycardic and hypotensive again requiring Neo-Synephrine. She developed respiratory distress but did not require intubation.  She has been on amiodarone in the remote past but was stopped secondary to potential side effects and currently pt suffers from peripheral neuropathy and does not want to use this drug again. When seen initially by Dr. Graciela Husbands in June,  he set up cardioversion with special attention to anesthesia, but did not mention in his note, future plans if pt should go back to aflutter.  Since it seems that I will not be able to add rate control for pt's low BP, Dr. Julieanne Cotton, being on vacation, I dicussed with Dr. Anne Fu, he  was able to come to the office and discuss with the pt as well. He feels that she needs to be on antiarrythmic, he  discussed as well with Dr. Elberta Fortis. It was then discussed with pt pursing admission for Tikosyn load and she is in agreement.   She does not  drink alcohol, use caffeine. Was in usual stare of health when this started, no obvious trigger.  F/u in afib clinic 8/6 for Tikosyn admit. She did convert to SR last night. Feels well today. Is aware of price of drug and is ready to come into hospital for drug. PharmD screened drugs and no qt prolonging drugs on board.   Today, she denies symptoms of palpitations, chest pain, shortness of breath, orthopnea, PND, lower extremity edema, dizziness, presyncope, syncope, or neurologic sequela. The patient is tolerating medications without difficulties and is otherwise without complaint today.       Past Medical History:  Diagnosis Date  . Anxiety   . Arthritis   . Atrial fibrillation (HCC)   . Bell's palsy    when pt. was 72 yrs old, when under stress the left side of face will droop.  [name] GERD (gastroesophageal reflux disease)   . History of kidney stones   . Hypertension   . Hypertrophic cardiomyopathy (HCC)    severe LV basilar hypertrophy witn no evidence of significant outflow tract obstruction, EF 65-70%, mild LAE, mild TR, grade 1a diastolic dysfunction 05/15/10 (Dr. Donato Schultz) (Atrial Septal Hypertrophy pattern)-- Intra-op TEE with dsignificant outflow tract obstruction - AI, MR & TR  . Insomnia   . Mild aortic sclerosis (HCC)   . Osteopenia   . Peripheral vascular disease (HCC)   . Syncope    , Vagal        Past Surgical History:  Procedure Laterality Date  . BREAST ENHANCEMENT SURGERY Bilateral   . CARDIAC  CATHETERIZATION N/A 05/07/2016   Procedure: Left Heart Cath and Coronary Angiography;  Surgeon: Kathleene Hazelhristopher D McAlhany, MD;  [facility];  Service: Cardiovascular;  Laterality: N/A;  . CARDIOVERSION N/A 09/24/2017   Procedure: CARDIOVERSION;  Surgeon: Laurey MoraleMcLean, Dalton S, MD;  [facility];  Service: Cardiovascular;  Laterality: N/A;  . ENDARTERECTOMY FEMORAL Right 03/02/2015   Procedure: ENDARTERECTOMY RIGHT FEMORAL;  Surgeon:  Chuck Hinthristopher S Dickson, MD;  [facility];  Service: Vascular;  Laterality: Right;  . FACIAL COSMETIC SURGERY    . FEMORAL-POPLITEAL BYPASS GRAFT Right 03/02/2015   Procedure: BYPASS GRAFT FEMORAL-BELOW KNEE POPLITEAL ARTERY;  Surgeon: Chuck Hinthristopher S Dickson, MD;  [facility];  Service: Vascular;  Laterality: Right;  . HERNIA REPAIR    . INGUINAL HERNIA REPAIR Bilateral   . left ovarian cyst    . PERIPHERAL VASCULAR CATHETERIZATION N/A 01/16/2015   Procedure: Abdominal Aortogram;  Surgeon: Chuck Hinthristopher S Dickson, MD;  [facility];  Service: Cardiovascular;  Laterality: N/A;  . TONSILLECTOMY            Current Outpatient Medications  Medication Sig Dispense Refill  . acetaminophen (TYLENOL) 500 MG tablet Take 1,500 mg by mouth every 6 (six) hours as needed (for pain.).    [name]. alendronate (FOSAMAX) 70 MG tablet Take 70 mg by mouth every Monday. Take with a full glass of water on an empty stomach.    [name]. aspirin EC 81 MG tablet Take 81 mg by mouth daily.    . Brimonidine Tartrate (LUMIFY) 0.025 % SOLN Place 1 drop into both eyes daily as needed (for dry eyes).    [name]. buPROPion (WELLBUTRIN XL) 150 MG 24 hr tablet Take 150 mg by mouth daily.    . Calcium-Phosphorus-Vitamin D (CALCIUM/D3 ADULT GUMMIES PO) Take 2 tablets by mouth daily at 2 PM.    . DEXILANT 30 MG capsule Take 30 mg by mouth daily before breakfast.     . diltiazem (CARDIZEM) 30 MG tablet Take 30 mg by mouth as needed (HTN).    [name]. ELIQUIS 5 MG TABS tablet TAKE 1 TABLET(5 MG) BY MOUTH TWICE DAILY 60 tablet 9  . ezetimibe (ZETIA) 10 MG tablet Take 10 mg by mouth daily.    . hydrALAZINE (APRESOLINE) 25 MG tablet TAKE 1 TABLET BY MOUTH EVERY 8 HOURS AS NEEDED FOR SYSTOLIC BLOOD PRESSURE(TOP NUMBER)>160 270 tablet 0  . midodrine (PROAMATINE) 10 MG tablet Take 10 mg by mouth 3 (three) times daily with meals as needed (for systolic blood pressure lower than 100).  90 tablet 1  . Multiple  Vitamins-Iron (STRESS B COMPLEX/IRON) TABS Take 1 tablet by mouth at bedtime.     . nebivolol (BYSTOLIC) 10 MG tablet Take 5 mg by mouth at bedtime.    Bertram Gala. Polyethyl Glycol-Propyl Glycol (SYSTANE) 0.4-0.3 % GEL ophthalmic gel Place 1 application into both eyes daily as needed (for eye irritation/dryness.).    [name]. PROAIR HFA 108 (90 Base) MCG/ACT inhaler Inhale 2 puffs into the lungs 4 (four) times daily as needed (for wheezing/shortness of breath.).   1  . rosuvastatin (CRESTOR) 5 MG tablet Take 2.5 mg by mouth 2 (two) times a week. Take 0.5 tablet (2.5mg ) by mouth on Mondays and Tuesdays.    [name]. zolpidem (AMBIEN) 5 MG tablet Take 5 mg by mouth at bedtime as needed for sleep.     . potassium chloride SA (K-DUR,KLOR-CON) 20 MEQ tablet Take 2 tablets (40 mEq total) by mouth once for 1 dose.  2 tablet 0   No current facility-administered medications for this encounter.          Allergies  Allergen Reactions  . Amoxicillin Other (See Comments)    UTI Has patient had a PCN reaction causing immediate rash, facial/tongue/throat swelling, SOB or lightheadedness with hypotension: No Has patient had a PCN reaction causing severe rash involving mucus membranes or skin necrosis: No Has patient had a PCN reaction that required hospitalization: No Has patient had a PCN reaction occurring within the last 10 years: Yes--UTI ONLY If all of the above answers are "NO", then may proceed with Cephalosporin use.   . Atenolol Cough  . Crestor [Rosuvastatin Calcium] Other (See Comments)    Muscle aches  . Pravastatin Other (See Comments)    Muscle aches  . Sulfa Antibiotics Nausea Only  . Codeine Rash    Social History        Socioeconomic History  . Marital status: Married    Spouse name: Not on file  . Number of children: Not on file  . Years of education: Not on file  . Highest education level: Not on file  Occupational History  . Not on file  Social Needs  . Financial  resource strain: Not on file  . Food insecurity:    Worry: Not on file    Inability: Not on file  . Transportation needs:    Medical: Not on file    Non-medical: Not on file  Tobacco Use  . Smoking status: Former Smoker    Packs/day: 1.00    Years: 50.00    Pack years: 50.00    Types: Cigarettes    Last attempt to quit: 12/15/2014    Years since quitting: 2.9  . Smokeless tobacco: Never Used  Substance and Sexual Activity  . Alcohol use: Yes    Comment: 1-2 per week  . Drug use: No  . Sexual activity: Not on file  Lifestyle  . Physical activity:    Days per week: Not on file    Minutes per session: Not on file  . Stress: Not on file  Relationships  . Social connections:    Talks on phone: Not on file    Gets together: Not on file    Attends religious service: Not on file    Active member of club or organization: Not on file    Attends meetings of clubs or organizations: Not on file    Relationship status: Not on file  . Intimate partner violence:    Fear of current or ex partner: Not on file    Emotionally abused: Not on file    Physically abused: Not on file    Forced sexual activity: Not on file  Other Topics Concern  . Not on file  Social History Narrative  . Not on file         Family History  Problem Relation Age of Onset  . Liver cancer Mother   . Cancer Mother        Liver  . Hypertension Mother   . Lung cancer Father   . Cancer Father        Lung  . Breast cancer Sister   . Cancer Sister        Breast    ROS- All systems are reviewed and negative except as per the HPI above  Physical Exam:    Vitals:   11/18/17 1110  BP: 126/82  Pulse: 62  Weight: 123 lb (55.8 kg)  Height: 5\' 5"  (1.651 m)      Wt Readings from Last 3 Encounters:  11/18/17 123 lb (55.8 kg)  11/17/17 121 lb (54.9 kg)  10/28/17 122 lb (55.3 kg)    Labs: RecentLabs       Lab Results  Component Value  Date   NA 144 11/18/2017   K 3.7 11/18/2017   CL 106 11/18/2017   CO2 28 11/18/2017   GLUCOSE 104 (H) 11/18/2017   BUN 28 (H) 11/18/2017   CREATININE 0.79 11/18/2017   CALCIUM 9.6 11/18/2017   PHOS 3.2 03/14/2015   MG 2.0 11/18/2017     RecentLabs       Lab Results  Component Value Date   INR 1.0 04/30/2016     RecentLabs       Lab Results  Component Value Date   CHOL 211 (H) 09/16/2016   HDL 97 09/16/2016   LDLCALC 98 09/16/2016   TRIG 78 09/16/2016       GEN- The patient is well appearing, alert and oriented x 3 today.   Head- normocephalic, atraumatic Eyes-  Sclera clear, conjunctiva pink Ears- hearing intact Oropharynx- clear Neck- supple, no JVP Lymph- no cervical lymphadenopathy Lungs- Clear to ausculation bilaterally, normal work of breathing Heart- Regular rate and rhythm, no murmurs, rubs or gallops, PMI not laterally displaced GI- soft, NT, ND, + BS Extremities- no clubbing, cyanosis, or edema MS- no significant deformity or atrophy Skin- no rash or lesion Psych- euthymic mood, full affect Neuro- strength and sensation are intact  EKG-Probable atypical atrial flutter vrs atrial tach at 106 bpm    Assessment and Plan: 1. Atrial flutter Per HPI, is thought that pt would be best treated with an antiarrythmic as she cannot tolerate rate control for h/o bradycardia and BP issues,   Used amiodarone in the remote past and prefers not to restart for potential side effects as well as current issues with peripheral neuropathy Dr. Anne Fu discussed with her use ofTikosyn and she is in agreement Pt states no missed doses of anticoagulation No benadryl for the last 3 days Pharm D to screened drugs, but no qt prolonging drugs seen Qtc today is acceptable at 444 ms Bmet/mag were in range for Tikosyn yesterday, today K+ at 3.7, she will take K+ 40 meq po x one today, while  awaiting bed Price discussed with pt and she states will  not  be an issue  Lupita Leash [facility] [address] [place], [place] [address] [phone]   I have seen, examined the patient, and reviewed the above assessment and plan.  Changes to above are made where necessary.  On exam, RRR.  Will admit for initiation of tikosyn.  QTc is stable for initiation of tikosyn at this time.  EP to follow closely while here.  Co Sign: Hillis Range, MD 11/18/2017 5:24 PM

## 2017-11-18 NOTE — Progress Notes (Signed)
Primary Care Physician: Beverly Li, Beverly Li, Li Cardiologist: Dr. Anne Li  EP: Dr. Graciela Li Referring: Beverly Li street triage    Beverly Li is a 72 y.o. female with a h/o HOCM, symptomatic brady with syncope,atrial flutter, Li/p successful  cardioversion in June, with recurrent arrhythmia as of  this am.Ekg shows atypical atrial  flutter vrs atrial  tach at 106 bpm. When it first started this am , she had symptomatic RVR and felt poorly. Her BP is soft at 96/64 and has h/o of autonomic dysfunction with liable HTN and significant issues with past sedation and drops  in BP.  She has a history of labile hypertension and during vascular procedure 11/16 she developed hypotension and suffered a respiratory arrest.  She underwent intubation and phenylephrine infusion.  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  1/18 she underwent catheterization for chest pain.  No coronary disease was noted but she had significant postprocedural hypertension and received IV hydralazine.  She became profoundly bradycardic and hypotensive again requiring Neo-Synephrine.  She developed respiratory distress but did not require intubation.  She has been on amiodarone in the remote past but was stopped secondary to potential side effects and currently pt suffers from peripheral neuropathy and does not want to use this drug again. When seen initially by Dr. Graciela Li in June,  he set up cardioversion with special attention to anesthesia, but did not mention in his note, future plans if pt should go back to aflutter.  Since it seems that I will not be able to add rate control for pt'Li low BP, Beverly Li, being on vacation, I dicussed with Dr. Anne Li, he  was able to come to the office and discuss with the pt as well. He feels that she needs to be on antiarrythmic, he  discussed as well with Beverly Li. It was then discussed with pt pursing admission for Tikosyn load and she is in agreement.   She does not drink  alcohol, use caffeine. Was in usual stare of health when this started, no obvious trigger.  Today, she denies symptoms of palpitations, chest pain, shortness of breath, orthopnea, PND, lower extremity edema, dizziness, presyncope, syncope, or neurologic sequela. The patient is tolerating medications without difficulties and is otherwise without complaint today.   Past Medical History:  Diagnosis Date  . Anxiety   . Arthritis   . Atrial fibrillation (HCC)   . Bell'Li palsy    when pt. was 72 yrs old, when under stress the left side of face will droop.  [name]. GERD (gastroesophageal reflux disease)   . History of kidney stones   . Hypertension   . Hypertrophic cardiomyopathy (HCC)    severe LV basilar hypertrophy witn no evidence of significant outflow tract obstruction, EF 65-70%, mild LAE, mild TR, grade 1a diastolic dysfunction 05/15/10 (Dr. Donato SchultzMark Li) (Atrial Septal Hypertrophy pattern)-- Intra-op TEE with dsignificant outflow tract obstruction - AI, MR & TR  . Insomnia   . Mild aortic sclerosis (HCC)   . Osteopenia   . Peripheral vascular disease (HCC)   . Syncope    , Vagal   Past Surgical History:  Procedure Laterality Date  . BREAST ENHANCEMENT SURGERY Bilateral   . CARDIAC CATHETERIZATION N/A 05/07/2016   Procedure: Left Heart Cath and Coronary Angiography;  Surgeon: Beverly Hazelhristopher D McAlhany, Li;  [facility];  Service: Cardiovascular;  Laterality: N/A;  . CARDIOVERSION N/A 09/24/2017   Procedure: CARDIOVERSION;  Surgeon: Beverly MoraleMcLean, Beverly Li, Li;  [facility];  Service: Cardiovascular;  Laterality: N/A;  . ENDARTERECTOMY FEMORAL Right 03/02/2015   Procedure: ENDARTERECTOMY RIGHT FEMORAL;  Surgeon: Beverly Li;  Location: [facility] OR;  Service: Vascular;  Laterality: Right;  . FACIAL COSMETIC SURGERY    . FEMORAL-POPLITEAL BYPASS GRAFT Right 03/02/2015   Procedure: BYPASS GRAFT FEMORAL-BELOW KNEE POPLITEAL ARTERY;  Surgeon: Beverly Li;   Location: [facility] OR;  Service: Vascular;  Laterality: Right;  . HERNIA REPAIR    . INGUINAL HERNIA REPAIR Bilateral   . left ovarian cyst    . PERIPHERAL VASCULAR CATHETERIZATION N/A 01/16/2015   Procedure: Abdominal Aortogram;  Surgeon: Beverly Li;  Location: [facility] INVASIVE CV LAB;  Service: Cardiovascular;  Laterality: N/A;  . TONSILLECTOMY      Current Outpatient Medications  Medication Sig Dispense Refill  . acetaminophen (TYLENOL) 500 MG tablet Take 1,500 mg by mouth every 6 (six) hours as needed (for pain.).    [name] alendronate (FOSAMAX) 70 MG tablet Take 70 mg by mouth every Monday. Take with a full glass of water on an empty stomach.    [name] aspirin EC 81 MG tablet Take 81 mg by mouth daily.    . Brimonidine Tartrate (LUMIFY) 0.025 % SOLN Place 1 drop into both eyes daily as needed (for dry eyes).    [name] buPROPion (WELLBUTRIN XL) 150 MG 24 hr tablet Take 150 mg by mouth daily.    . Calcium-Phosphorus-Vitamin D (CALCIUM/D3 ADULT GUMMIES PO) Take 2 tablets by mouth daily at 2 PM.    . DEXILANT 30 MG capsule Take 30 mg by mouth daily before breakfast.     . diltiazem (CARDIZEM) 30 MG tablet Take 30 mg by mouth as needed (HTN).    [name] ELIQUIS 5 MG TABS tablet TAKE 1 TABLET(5 MG) BY MOUTH TWICE DAILY 60 tablet 9  . ezetimibe (ZETIA) 10 MG tablet Take 10 mg by mouth daily.    . hydrALAZINE (APRESOLINE) 25 MG tablet TAKE 1 TABLET BY MOUTH EVERY 8 HOURS AS NEEDED FOR SYSTOLIC BLOOD PRESSURE(TOP NUMBER)>160 270 tablet 0  . midodrine (PROAMATINE) 10 MG tablet Take 10 mg by mouth 3 (three) times daily with meals as needed (for systolic blood pressure lower than 100).  90 tablet 1  . Multiple Vitamins-Iron (STRESS B COMPLEX/IRON) TABS Take 1 tablet by mouth at bedtime.     . nebivolol (BYSTOLIC) 10 MG tablet Take 5 mg by mouth at bedtime.    Bertram Gala Glycol-Propyl Glycol (SYSTANE) 0.4-0.3 % GEL ophthalmic gel Place 1 application into both eyes daily as needed (for eye irritation/dryness.).    [name]  PROAIR HFA 108 (90 Base) MCG/ACT inhaler Inhale 2 puffs into the lungs 4 (four) times daily as needed (for wheezing/shortness of breath.).   1  . rosuvastatin (CRESTOR) 5 MG tablet Take 2.5 mg by mouth 2 (two) times a week. Take 0.5 tablet (2.5mg ) by mouth on Mondays and Tuesdays.    [name] zolpidem (AMBIEN) 5 MG tablet Take 5 mg by mouth at bedtime as needed for sleep.      No current facility-administered medications for this encounter.     Allergies  Allergen Reactions  . Amoxicillin Other (See Comments)    UTI Has patient had a PCN reaction causing immediate rash, facial/tongue/throat swelling, SOB or lightheadedness with hypotension: No Has patient had a PCN reaction causing severe rash involving mucus membranes or skin necrosis: No Has patient had a PCN reaction that required hospitalization: No Has patient had a PCN  reaction occurring within the last 10 years: Yes--UTI ONLY If all of the above answers are "NO", then may proceed with Cephalosporin use.   . Atenolol Cough  . Crestor [Rosuvastatin Calcium] Other (See Comments)    Muscle aches  . Pravastatin Other (See Comments)    Muscle aches  . Sulfa Antibiotics Nausea Only  . Codeine Rash    Social History   Socioeconomic History  . Marital status: Married    Spouse name: Not on file  . Number of children: Not on file  . Years of education: Not on file  . Highest education level: Not on file  Occupational History  . Not on file  Social Needs  . Financial resource strain: Not on file  . Food insecurity:    Worry: Not on file    Inability: Not on file  . Transportation needs:    Medical: Not on file    Non-medical: Not on file  Tobacco Use  . Smoking status: Former Smoker    Packs/day: 1.00    Years: 50.00    Pack years: 50.00    Types: Cigarettes    Last attempt to quit: 12/15/2014    Years since quitting: 2.9  . Smokeless tobacco: Never Used  Substance and Sexual Activity  . Alcohol use: Yes    Comment: 1-2 per  week  . Drug use: No  . Sexual activity: Not on file  Lifestyle  . Physical activity:    Days per week: Not on file    Minutes per session: Not on file  . Stress: Not on file  Relationships  . Social connections:    Talks on phone: Not on file    Gets together: Not on file    Attends religious service: Not on file    Active member of club or organization: Not on file    Attends meetings of clubs or organizations: Not on file    Relationship status: Not on file  . Intimate partner violence:    Fear of current or ex partner: Not on file    Emotionally abused: Not on file    Physically abused: Not on file    Forced sexual activity: Not on file  Other Topics Concern  . Not on file  Social History Narrative  . Not on file    Family History  Problem Relation Age of Onset  . Liver cancer Mother   . Cancer Mother        Liver  . Hypertension Mother   . Lung cancer Father   . Cancer Father        Lung  . Breast cancer Sister   . Cancer Sister        Breast    ROS- All systems are reviewed and negative except as per the HPI above  Physical Exam: Vitals:   11/17/17 1151  BP: 96/64  Pulse: (!) 106  Weight: 121 lb (54.9 kg)  Height: 5\' 5"  (1.651 m)   Wt Readings from Last 3 Encounters:  11/17/17 121 lb (54.9 kg)  10/28/17 122 lb (55.3 kg)  09/26/17 125 lb (56.7 kg)    Labs: Lab Results  Component Value Date   NA 142 11/17/2017   K 4.0 11/17/2017   CL 104 11/17/2017   CO2 28 11/17/2017   GLUCOSE 120 (H) 11/17/2017   BUN 30 (H) 11/17/2017   CREATININE 0.77 11/17/2017   CALCIUM 9.5 11/17/2017   PHOS 3.2 03/14/2015   MG 2.2 11/17/2017  Lab Results  Component Value Date   INR 1.0 04/30/2016   Lab Results  Component Value Date   CHOL 211 (H) 09/16/2016   HDL 97 09/16/2016   LDLCALC 98 09/16/2016   TRIG 78 09/16/2016     GEN- The patient is well appearing, alert and oriented x 3 today.   Head- normocephalic, atraumatic Eyes-  Sclera clear,  conjunctiva pink Ears- hearing intact Oropharynx- clear Neck- supple, no JVP Lymph- no cervical lymphadenopathy Lungs- Clear to ausculation bilaterally, normal work of breathing Heart- Regular rate and rhythm, no murmurs, rubs or gallops, PMI not laterally displaced GI- soft, NT, ND, + BS Extremities- no clubbing, cyanosis, or edema MS- no significant deformity or atrophy Skin- no rash or lesion Psych- euthymic mood, full affect Neuro- strength and sensation are intact  EKG-Probable atypical atrial flutter vrs atrial tach at 106 bpm    Assessment and Plan: 1. Atrial flutter Per HPI, is thought that pt would be best treated with an antiarrythmic as she cannot tolerate rate control for h/o bradycardia and BP issues Used amiodarone in the remote past and prefers not to restart for potential side effects as well as current issues with peripheral neuropathy Dr. Anne Fu discussed witgh her to sue Tikosyn and she is in agreement Pt states no missed doses of anticoagulation No benadryl for the last 3 days Pharm D to screen drugs, but no qt prolonging drugs seen Pt will return tomorrow for admission Qtc today is acceptable at 448 ms Bmet/mag today are in good range for [employer] discussed with pt and she feels will not be an issue  [facility]ly [facility] [address] [place], [place] [address] [phone]

## 2017-11-18 NOTE — Progress Notes (Addendum)
Discussed with pharmacist, given patient took dose of 40meq prior to her arrival and after lab draw, I had discussed with Dr. Johney FrameAllred from our standpoint is OK to receive dose. Was told given hospital/pharmacy protocol, necessary for K+ level redraw with level 4.0 or better prior to initial dose of Tikosyn.This is being drawn stat now.    Hopefully she will get her dose in later tonight and we will stagger AM dose tomorrow.Francis Dowse.  Alejo Beamer, PA-C

## 2017-11-19 LAB — BASIC METABOLIC PANEL
Anion gap: 9 (ref 5–15)
BUN: 21 mg/dL (ref 8–23)
CO2: 27 mmol/L (ref 22–32)
CREATININE: 0.6 mg/dL (ref 0.44–1.00)
Calcium: 8.9 mg/dL (ref 8.9–10.3)
Chloride: 105 mmol/L (ref 98–111)
GFR calc Af Amer: >60 mL/min (ref >60)
GLUCOSE: 91 mg/dL (ref 70–99)
Potassium: 3.6 mmol/L (ref 3.5–5.1)
Sodium: 141 mmol/L (ref 135–145)

## 2017-11-19 LAB — MAGNESIUM: MAGNESIUM: 2 mg/dL (ref 1.7–2.4)

## 2017-11-19 MED ORDER — POTASSIUM CHLORIDE CRYS ER 20 MEQ PO TBCR
40.0000 meq | EXTENDED_RELEASE_TABLET | Freq: Once | ORAL | Status: AC
  Administered 2017-11-19: 40 meq via ORAL
  Filled 2017-11-19: qty 2

## 2017-11-19 MED ORDER — DOFETILIDE 250 MCG PO CAPS
250.0000 ug | ORAL_CAPSULE | Freq: Two times a day (BID) | ORAL | Status: DC
  Administered 2017-11-19 – 2017-11-21 (×4): 250 ug via ORAL
  Filled 2017-11-19: qty 14
  Filled 2017-11-19 (×4): qty 1

## 2017-11-19 MED ORDER — DOFETILIDE 250 MCG PO CAPS
250.0000 ug | ORAL_CAPSULE | Freq: Two times a day (BID) | ORAL | Status: DC
  Administered 2017-11-19: 250 ug via ORAL
  Filled 2017-11-19: qty 1

## 2017-11-19 NOTE — Progress Notes (Signed)
Post dose EKG reviewed, manually measured QT 480ms, QTc 434ms Tracing reviewed with Dr. Johney FrameAllred.  K+ was replaced this morning  OK to continue with Tikosyn load  Francis Dowseenee Ursuy, PA-C

## 2017-11-19 NOTE — Care Management Note (Signed)
Case Management Note  Patient Details  Name: Hyacinth MeekerFrances B Johann MRN: [id number] Date of Birth: 10-Mar-1946  Subjective/Objective:   Atrial flutter                 Action/Plan: NCM spoke to pt and she is independent at home. Pt will be provided Tikosyn 7 day supply at dc. Will need a separate RX for Unit [facility]. Her copay is $79. Updated pt.   Vladimir CroftsGreenlee, Dora  Vi Whitesel E, RN        # 4.  [facility] # [id number]   1. TIKOSYN 250 MCG BID  COVER- NOT COVER  PRIOR APPROVAL- YES # [phone]   2. DOFETILIDE 250 MCG BID  COVER- YES  CO-PAY- $ 79.10  TIER- NO  PRIOR APPROVAL- NO   NO DEDUCTIBLE   PREFERRED PHARMACY : YES  [facility]        Expected Discharge Date:                 Expected Discharge Plan:  Home/Self Care  In-House Referral:  NA  Discharge planning Services  CM Consult, Medication Assistance  Post Acute Care Choice:  NA Choice offered to:  NA  DME Arranged:  N/A DME Agency:  NA  HH Arranged:  NA HH Agency:  NA  Status of Service:  Completed, signed off  If discussed at Long Length of Stay Meetings, dates discussed:    Additional Comments:  Elliot CousinShavis, Gurneet Matarese Ellen, RN 11/19/2017, 4:46 PM

## 2017-11-19 NOTE — Progress Notes (Addendum)
Progress Note  Patient Name: Beverly Li Date of Encounter: 11/19/2017  Primary Cardiologist: Dr. Anne Fu  Subjective   Not much sleep last night, otherwise feels well, no complaints  Inpatient Medications    Scheduled Meds: . apixaban  5 mg Oral BID  . aspirin EC  81 mg Oral Daily  . buPROPion  150 mg Oral q morning - 10a  . Dexlansoprazole  30 mg Oral QAC breakfast  . dofetilide  250 mcg Oral BID  . ezetimibe  10 mg Oral Daily  . nebivolol  5 mg Oral QHS  . potassium chloride  40 mEq Oral Once  . rosuvastatin  2.5 mg Oral Once per day on Mon Tue Wed Thu Fri  . sodium chloride flush  3 mL Intravenous Q12H  . zolpidem  5 mg Oral QHS   Continuous Infusions: . sodium chloride     PRN Meds: sodium chloride, albuterol, diltiazem, hydrALAZINE, midodrine, sodium chloride flush   Vital Signs    Vitals:   11/18/17 2020 11/19/17 0210 11/19/17 0500  BP: (!) 171/86 (!) 167/96 (!) 166/87  Pulse: (!) 57  (!) 53  Resp: 16  16  Temp: 98.2 F (36.8 C)  97.8 F (36.6 C)  TempSrc: Oral  Oral  SpO2: 95%  97%  Weight:   123 lb (55.8 kg)   No intake or output data in the 24 hours ending 11/19/17 0725 Filed Weights   11/19/17 0500  Weight: 123 lb (55.8 kg)    Telemetry    SB 50's - Personally Reviewed  ECG    SB 55bpm, manually measured QT , corrected - Personally Reviewed  Physical Exam   GEN: No acute distress.   Neck: No JVD Cardiac: RRR, no murmurs, rubs, or gallops.  Respiratory: CTA b/l GI: Soft, nontender, non-distended  MS: No edema; No deformity. Neuro:  Nonfocal  Psych: Normal affect   Labs    Chemistry Recent Labs  Lab 11/18/17 1106 11/18/17 2139 11/19/17 0408  NA 144 139 141  K 3.7 4.3 3.6  CL 106 105 105  CO2 28 26 27   GLUCOSE 104* 107* 91  BUN 28* 26* 21  CREATININE 0.79 0.83 0.60  CALCIUM 9.6 9.3 8.9  GFRNONAA >60 >60 >60  GFRAA >60 >60 >60  ANIONGAP 10 8 9      HematologyNo results for input(s): WBC, RBC, HGB, HCT,  MCV, MCH, MCHC, RDW, PLT in the last 168 hours.  Cardiac EnzymesNo results for input(s): TROPONINI in the last 168 hours. No results for input(s): TROPIPOC in the last 168 hours.   BNPNo results for input(s): BNP, PROBNP in the last 168 hours.   DDimer No results for input(s): DDIMER in the last 168 hours.   Radiology    No results found.  Cardiac Studies   05/08/16: TTE Study Conclusions - Left ventricle: The cavity size was normal. Wall thickness was   increased in a pattern of moderate LVH. There was severe focal   basal hypertrophy of the septum (measures 2.2 cm) consistent with   HOCM - the rest LVOT gradient is 23 mmHg. Systolic function was   vigorous. The estimated ejection fraction was in the range of 65%   to 70%. Wall motion was normal; there were no regional wall   motion abnormalities. Doppler parameters are consistent with   abnormal left ventricular relaxation (grade 1 diastolic   dysfunction). The E/e&' ratio is >25, suggesting markedly elevated   LV Filling pressure. - Aortic valve:  Mildly calcified leaflets. Mild stenosis - the peak   aortic valve gradient is 42 mmHg. There was no regurgitation. - Mitral valve: Calcified annulus. SAM is noted. Mildly thickened   leaflets . There was moderate regurgitation. - Left atrium: The atrium was mildly dilated. (47mm) - Tricuspid valve: There was mild regurgitation. - Pulmonary arteries: PA peak pressure: 29 mm Hg (S). - Inferior vena cava: The vessel was normal in size. The   respirophasic diameter changes were in the normal range (>= 50%),   consistent with normal central venous pressure. Impressions: - Findings consistent with HOCM - proximal septum measures 2.2 cm,   SAM is noted, the rest LVOT gradient is 23 mmHg - the peak   continuous gradient across the aortic valve is 42 mmHg. The   aortic valve is not well-visualized but appears calcified and is   likely mildly stenotic. LVEF remains 70% or greater. LV  filling   pressure is very high. There is at least moderate MR, mostly due   to systolic anterior motion [facility](SAM) of the anterior mitral leaflet.   Valsalva was not performed.   Patient Profile     72 y.o. female with a h/o HOCM, symptomatic brady with syncope,atrial flutter, s/p successful  cardioversion in June, had recurrent PAFlutter w/RVR, seen in the AFib clinic 11/17/17 in conjunction with Dr. Anne FuSkains planned for Tikosyn initiation   She has a history of labile hypertension and during vascular procedure 11/16 she developed hypotension and suffered a respiratory arrest.  She underwent intubation and phenylephrine infusion.  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  1/18 she underwent catheterization for chest pain.  No coronary disease was noted but she had significant postprocedural hypertension and received IV hydralazine.  She became profoundly bradycardic and hypotensive again requiring Neo-Synephrine.  She developed respiratory distress but did not require intubation  (Her home BP PRN BP meds have been ordered for here)  Assessment & Plan    1. Paroxysmal Atypical AFlutter     CHA2DS2Vasc is 3, on Eliquis, appropriately dosed     K+ 3.6 (replacement ordered)     Mag 2.0     Creat 0.60 (stable)     QT ok to continue load  2. Labile HTN     Home regime noted  For questions or updates, please contact [facility] Please consult [email or web address] for contact info under Cardiology/STEMI.      Signed, Sheilah PigeonRenee Lynn Ursuy, PA-C  11/19/2017, 7:25 AM    I have seen, examined the patient, and reviewed the above assessment and plan.  Changes to above are made where necessary.  On exam, RRR.  Maintaining sinus rhythm.  QT is stable. Continue to monitor on telemetry.  Co Sign: Hillis RangeJames Loyal Rudy, MD 11/19/2017 2:47 PM

## 2017-11-19 NOTE — Progress Notes (Signed)
Vladimir CroftsGreenlee, Dora  Rayvion Stumph E, RN        # 4.  [facility] # [id number]   1. TIKOSYN 250 MCG BID  COVER- NOT COVER  PRIOR APPROVAL- YES # [phone]   2. DOFETILIDE 250 MCG BID  COVER- YES  CO-PAY- $ 79.10  TIER- NO  PRIOR APPROVAL- NO   NO DEDUCTIBLE   PREFERRED PHARMACY : YES  [facility]   Previous Messages

## 2017-11-20 ENCOUNTER — Encounter (HOSPITAL_COMMUNITY): Payer: Self-pay | Admitting: Certified Registered"

## 2017-11-20 ENCOUNTER — Ambulatory Visit (HOSPITAL_COMMUNITY): Admission: RE | Admit: 2017-11-20 | Payer: Medicare Other | Source: Ambulatory Visit | Admitting: Cardiology

## 2017-11-20 ENCOUNTER — Encounter (HOSPITAL_COMMUNITY)
Admission: AD | Disposition: A | Discharge status: Home / Self Care | Payer: Self-pay | Source: Ambulatory Visit | Attending: Internal Medicine

## 2017-11-20 LAB — BASIC METABOLIC PANEL
Anion gap: 10 (ref 5–15)
BUN: 23 mg/dL (ref 8–23)
CHLORIDE: 107 mmol/L (ref 98–111)
CO2: 26 mmol/L (ref 22–32)
CREATININE: 0.68 mg/dL (ref 0.44–1.00)
Calcium: 9 mg/dL (ref 8.9–10.3)
GFR calc Af Amer: >60 mL/min (ref >60)
GFR calc non Af Amer: >60 mL/min (ref >60)
GLUCOSE: 92 mg/dL (ref 70–99)
Potassium: 4 mmol/L (ref 3.5–5.1)
SODIUM: 143 mmol/L (ref 135–145)

## 2017-11-20 LAB — MAGNESIUM: MAGNESIUM: 2 mg/dL (ref 1.7–2.4)

## 2017-11-20 SURGERY — CARDIOVERSION
Anesthesia: General

## 2017-11-20 MED ORDER — POTASSIUM CHLORIDE CRYS ER 20 MEQ PO TBCR
20.0000 meq | EXTENDED_RELEASE_TABLET | Freq: Every day | ORAL | Status: DC
  Administered 2017-11-20 – 2017-11-21 (×2): 20 meq via ORAL
  Filled 2017-11-20 (×2): qty 1

## 2017-11-20 NOTE — Progress Notes (Signed)
Progress Note   Subjective   Doing well today, the patient denies CP or SOB.  No new concerns  Inpatient Medications    Scheduled Meds: . apixaban  5 mg Oral BID  . aspirin EC  81 mg Oral Daily  . buPROPion  150 mg Oral q morning - 10a  . Dexlansoprazole  30 mg Oral QAC breakfast  . dofetilide  250 mcg Oral BID  . ezetimibe  10 mg Oral Daily  . nebivolol  5 mg Oral QHS  . potassium chloride  20 mEq Oral Daily  . rosuvastatin  2.5 mg Oral Once per day on Mon Tue Wed Thu Fri  . sodium chloride flush  3 mL Intravenous Q12H  . zolpidem  5 mg Oral QHS   Continuous Infusions: . sodium chloride     PRN Meds: sodium chloride, albuterol, diltiazem, hydrALAZINE, midodrine, sodium chloride flush   Vital Signs    Vitals:   11/19/17 1439 11/19/17 1941 11/20/17 0324 11/20/17 0609  BP: (!) 141/85 (!) 153/83 (!) 158/79 (!) 158/79  Pulse: (!) 50 (!) 57 (!) 54 (!) 54  Resp:  16 17 17   Temp: 98 F (36.7 C) 98 F (36.7 C) 98.4 F (36.9 C) 98.4 F (36.9 C)  TempSrc: Oral Oral Oral Oral  SpO2: 95% 94% 95%   Weight:   55.7 kg 55.7 kg  Height:    5\' 5"  (1.651 m)    Intake/Output Summary (Last 24 hours) at 11/20/2017 0816 Last data filed at 11/19/2017 2217 Gross per 24 hour  Intake 963 ml  Output -  Net 963 ml   Filed Weights   11/19/17 0500 11/20/17 0324 11/20/17 0609  Weight: 55.8 kg 55.7 kg 55.7 kg    Telemetry    Sinus rhythm, no arrhythmias - Personally Reviewed  Physical Exam   GEN- The patient is well appearing, alert and oriented x 3 today.   Head- normocephalic, atraumatic Eyes-  Sclera clear, conjunctiva pink Ears- hearing intact Oropharynx- clear Neck- supple, Lungs- Clear to ausculation bilaterally, normal work of breathing Heart- Regular rate and rhythm  GI- soft, NT, ND, + BS Extremities- no clubbing, cyanosis, or edema  MS- no significant deformity or atrophy Skin- no rash or lesion Psych- euthymic mood, full affect Neuro- strength and sensation are  intact   Labs    Chemistry Recent Labs  Lab 11/18/17 2139 11/19/17 0408 11/20/17 0357  NA 139 141 143  K 4.3 3.6 4.0  CL 105 105 107  CO2 26 27 26   GLUCOSE 107* 91 92  BUN 26* 21 23  CREATININE 0.83 0.60 0.68  CALCIUM 9.3 8.9 9.0  GFRNONAA >60 >60 >60  GFRAA >60 >60 >60  ANIONGAP 8 9 10      HematologyNo results for input(s): WBC, RBC, HGB, HCT, MCV, MCH, MCHC, RDW, PLT in the last 168 hours.  Cardiac EnzymesNo results for input(s): TROPONINI in the last 168 hours. No results for input(s): TROPIPOC in the last 168 hours.     Patient Profile     72 y.o. female with a h/o HOCM, symptomatic brady with syncope,atrial flutter, s/p successful cardioversion in June, had recurrent PAFlutter w/RVR, seen in the AFib clinic 11/17/17 in conjunction with Dr. Anne FuSkains planned for Tikosyn initiation   She has a history of labile hypertension and during vascular procedure 11/16 she developed hypotension and suffered a respiratory arrest. She underwent intubation and phenylephrine infusion. 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  1/18 she underwent  catheterization for chest pain. No coronary disease was noted but she had significant postprocedural hypertension and received IV hydralazine. She became profoundly bradycardic and hypotensive again requiring Neo-Synephrine. She developed respiratory distress but did not require intubation  (Her home BP PRN BP meds have been ordered for here)   Assessment & Plan    1.  Persistent afib/ atypical atrial flutter Doing well with tikosyn Qt is 490 today and stable No changes  2. HTN Stable No change required today  Anticipate discharge to home tomorrow.  Needs close follow-up in AF clinic and with Dr Milford Cage MD, [facility] 11/20/2017 8:16 AM

## 2017-11-20 NOTE — Plan of Care (Signed)
Tikosyn protocol. Monitor EKG Qtc.

## 2017-11-21 DIAGNOSIS — Z79899 Other long term (current) drug therapy: Secondary | ICD-10-CM

## 2017-11-21 DIAGNOSIS — Z5181 Encounter for therapeutic drug level monitoring: Secondary | ICD-10-CM

## 2017-11-21 LAB — BASIC METABOLIC PANEL
Anion gap: 12 (ref 5–15)
BUN: 23 mg/dL (ref 8–23)
CALCIUM: 9.6 mg/dL (ref 8.9–10.3)
CO2: 26 mmol/L (ref 22–32)
CREATININE: 0.67 mg/dL (ref 0.44–1.00)
Chloride: 103 mmol/L (ref 98–111)
GFR calc Af Amer: >60 mL/min (ref >60)
GLUCOSE: 77 mg/dL (ref 70–99)
POTASSIUM: 4.3 mmol/L (ref 3.5–5.1)
SODIUM: 141 mmol/L (ref 135–145)

## 2017-11-21 LAB — MAGNESIUM: MAGNESIUM: 2.1 mg/dL (ref 1.7–2.4)

## 2017-11-21 MED ORDER — POTASSIUM CHLORIDE CRYS ER 10 MEQ PO TBCR: 10.0000 meq | EXTENDED_RELEASE_TABLET | Freq: Every day | ORAL | 6 refills | Status: DC

## 2017-11-21 MED ORDER — POTASSIUM CHLORIDE CRYS ER 10 MEQ PO TBCR: 10.0000 meq | EXTENDED_RELEASE_TABLET | Freq: Every day | ORAL | Status: DC

## 2017-11-21 MED ORDER — DOFETILIDE 250 MCG PO CAPS: 250.0000 ug | ORAL_CAPSULE | Freq: Two times a day (BID) | ORAL | 6 refills | Status: DC

## 2017-11-21 MED ORDER — NEBIVOLOL HCL 2.5 MG PO TABS: 2.5000 mg | ORAL_TABLET | Freq: Every day | ORAL | 6 refills | Status: DC

## 2017-11-21 NOTE — Progress Notes (Addendum)
Post dose EKG and telemetry reviewed with Dr. Elberta Fortisamnitz OK to proceed with Tikosyn Anticipate discharge this afternoon once final dose/EKG are completed.  BP running high, c/w home PRN regime with her hydralazine, will stop the PRN dilt given HR in sinus is 50's (40's with rest/sleep)  D/W Dr. Anne FuSkains who knows the patient very well.  Longstanding very labile HTN, home regime has done well for her, and patient very knowledgeable with it.  Agrees with current, also is OK with her continuing with the PRN dilt with baseline bradycardia.  Would discharge when load protocol completed as usual and continue home BP meds/regime unchanged.  SB is asymptomatic  Francis DowseEnee Rinda Rollyson, PA-C

## 2017-11-21 NOTE — Discharge Instructions (Signed)

## 2017-11-21 NOTE — Discharge Summary (Addendum)
ELECTROPHYSIOLOGY PROCEDURE DISCHARGE SUMMARY    Patient ID: Beverly Li,  MRN: [id number], DOB/AGE: November 05, 1945 72 y.o.  Admit date: 11/18/2017 Discharge date: 11/21/2017  Primary Care Physician: Clayborn Heron, MD  Primary Cardiologist: Dr. Anne Fu Electrophysiologist: Dr. Graciela Husbands  Primary Discharge Diagnosis:  1.  Paroxysmal atrial fibrillation status post Tikosyn loading this admission      CHA2DS2Vasc is 3, on Eliquis, appropriately dosed   Secondary Discharge Diagnosis:  1. HTN 2. HOCM 3. Labile HTN  Allergies  Allergen Reactions  . Amoxicillin Other (See Comments)    UTI Has patient had a PCN reaction causing immediate rash, facial/tongue/throat swelling, SOB or lightheadedness with hypotension: No Has patient had a PCN reaction causing severe rash involving mucus membranes or skin necrosis: No Has patient had a PCN reaction that required hospitalization: No Has patient had a PCN reaction occurring within the last 10 years: Yes--UTI ONLY If all of the above answers are "NO", then may proceed with Cephalosporin use.   . Atenolol Cough  . Crestor [Rosuvastatin Calcium] Other (See Comments)    Muscle aches  . Pravastatin Other (See Comments)    Muscle aches  . Sulfa Antibiotics Nausea Only  . Codeine Rash     Procedures This Admission:  1.  Tikosyn loading  Brief HPI: Beverly Li is a 72 y.o. female with a past medical history as noted above. She is followed by  EP and the AFib clinic in the outpatient setting for atrial fibrillation.  Risks, benefits, and alternatives to Tikosyn were reviewed with the patient who wished to proceed.    Hospital Course:  The patient was admitted and Tikosyn was initiated.  Renal function and electrolytes were followed during the hospitalization.  She did require potassium supplementation prior ti initiation and while here, we Shyah Cadmus send her home with small dose of daily supplementation. Her QTc remained stable.  She  arrived in and mantained SR thorough her stay.  The patient was monitored until discharge on telemetry which demonstrated SB high 40's w/sleep and rest, 50's, without symptoms of bradycardia.  We Arvle Grabe reduce her bystolic dose given bradycardia and allow room for her PRN dilt that she uses for her BP and is what works best for her for this.  On the day of discharge, she is feeling very well, no CP, palpitations , no dizziness/lightheadedness, she was examined by Dr Elberta Fortis who considered the patient stable for discharge to home.  Follow-up has been arranged with the AFib clinic in 1 week and with Dr Graciela Husbands in 4 weeks.   Physical Exam: Vitals:   11/21/17 0638 11/21/17 0830 11/21/17 0835 11/21/17 0836  BP: (!) 156/75 (!) 173/100 (!) 194/104 (!) 186/102  Pulse: (!) 48     Resp: 16     Temp: 98 F (36.7 C)     TempSrc: Oral     SpO2: 97%     Weight: 55.2 kg     Height:        GEN- The patient is well appearing, alert and oriented x 3 today.   HEENT: normocephalic, atraumatic; sclera clear, conjunctiva pink; hearing intact; oropharynx clear; neck supple, no JVP Lymph- no cervical lymphadenopathy Lungs- CTA b/l, normal work of breathing.  No wheezes, rales, rhonchi Heart- RRR, no murmurs, rubs or gallops, PMI not laterally displaced GI- soft, non-tender, non-distended Extremities- no clubbing, cyanosis, or edema MS- no significant deformity or atrophy Skin- warm and dry, no rash or lesion Psych- euthymic mood, full  affect Neuro- strength and sensation are intact   Labs:   Lab Results  Component Value Date   WBC 7.8 09/26/2017   HGB 14.2 09/26/2017   HCT 45.8 09/26/2017   MCV 93.9 09/26/2017   PLT 243 09/26/2017    Recent Labs  Lab 11/21/17 0431  NA 141  K 4.3  CL 103  CO2 26  BUN 23  CREATININE 0.67  CALCIUM 9.6  GLUCOSE 77     Discharge Medications:  Allergies as of 11/21/2017      Reactions   Amoxicillin Other (See Comments)   UTI Has patient had a PCN reaction  causing immediate rash, facial/tongue/throat swelling, SOB or lightheadedness with hypotension: No Has patient had a PCN reaction causing severe rash involving mucus membranes or skin necrosis: No Has patient had a PCN reaction that required hospitalization: No Has patient had a PCN reaction occurring within the last 10 years: Yes--UTI ONLY If all of the above answers are "NO", then may proceed with Cephalosporin use.   Atenolol Cough   Crestor [rosuvastatin Calcium] Other (See Comments)   Muscle aches   Pravastatin Other (See Comments)   Muscle aches   Sulfa Antibiotics Nausea Only   Codeine Rash      Medication List    TAKE these medications   acetaminophen 500 MG tablet Commonly known as:  TYLENOL Take 1,500 mg by mouth every 6 (six) hours as needed (for pain.).   alendronate 70 MG tablet Commonly known as:  FOSAMAX Take 70 mg by mouth every Monday. Take with a full glass of water on an empty stomach.   aspirin EC 81 MG tablet Take 81 mg by mouth daily.   buPROPion 150 MG 24 hr tablet Commonly known as:  WELLBUTRIN XL Take 150 mg by mouth every morning.   CALCIUM/D3 ADULT GUMMIES PO Take 2 tablets by mouth daily at 2 PM.   DEXILANT 30 MG capsule Generic drug:  Dexlansoprazole Take 30 mg by mouth daily before breakfast.   diltiazem 30 MG tablet Commonly known as:  CARDIZEM Take 30 mg by mouth daily as needed (blood pressure higher than 160).   dofetilide 250 MCG capsule Commonly known as:  TIKOSYN Take 1 capsule (250 mcg total) by mouth 2 (two) times daily.   ELIQUIS 5 MG Tabs tablet Generic drug:  apixaban TAKE 1 TABLET(5 MG) BY MOUTH TWICE DAILY   ezetimibe 10 MG tablet Commonly known as:  ZETIA Take 10 mg by mouth daily.   hydrALAZINE 25 MG tablet Commonly known as:  APRESOLINE TAKE 1 TABLET BY MOUTH EVERY 8 HOURS AS NEEDED FOR SYSTOLIC BLOOD PRESSURE(TOP NUMBER)>160   LUMIFY 0.025 % Soln Generic drug:  Brimonidine Tartrate Place 1 drop into both eyes  daily as needed (for dry eyes).   midodrine 10 MG tablet Commonly known as:  PROAMATINE Take 10 mg by mouth 3 (three) times daily with meals as needed (for systolic blood pressure lower than 100).   nebivolol 2.5 MG tablet Commonly known as:  BYSTOLIC Take 1 tablet (2.5 mg total) by mouth at bedtime. What changed:    medication strength  how much to take   potassium chloride 10 MEQ tablet Commonly known as:  K-DUR,KLOR-CON Take 1 tablet (10 mEq total) by mouth daily. Start taking on:  11/22/2017 What changed:    medication strength  how much to take  when to take this   PROAIR HFA 108 (90 Base) MCG/ACT inhaler Generic drug:  albuterol Inhale 2 puffs into  the lungs 4 (four) times daily as needed (for wheezing/shortness of breath.).   rosuvastatin 5 MG tablet Commonly known as:  CRESTOR Take 2.5 mg by mouth See admin instructions. Take 2.5mg  by mouth 5 days weekly   STRESS B COMPLEX/IRON Tabs Take 1 tablet by mouth at bedtime.   SYSTANE 0.4-0.3 % Gel ophthalmic gel Generic drug:  Polyethyl Glycol-Propyl Glycol Place 1 application into both eyes daily as needed (for eye irritation/dryness.).   zolpidem 5 MG tablet Commonly known as:  AMBIEN Take 5 mg by mouth at bedtime.       Disposition: Home Discharge Instructions    Diet - low sodium heart healthy   Complete by:  As directed    Increase activity slowly   Complete by:  As directed      Follow-up Information    [facility] Follow up on 11/28/2017.   Specialty:  Cardiology Why:  10:30AM Contact information: [address] [id number] mc [place] [address] [phone]       Duke Salvia, MD Follow up on 12/24/2017.   Specialty:  Cardiology Why:  3:15PM Contact information: [address] [place] [address] [phone]           Duration of Discharge Encounter: Greater than 30 minutes including physician  time.  SignedFrancis Dowse, PA-C 11/21/2017 12:14 PM   I have seen and examined this patient with Francis Dowse.  Agree with above, note added to reflect my findings.  On exam, RRR, no murmurs, lungs clear.  Patient initially admitted for dofetilide loading.  QTC is remained stable.  We Brianah Hopson plan for discharge today with follow-up in atrial fibrillation clinic.  Sakina Briones M. Aldric Wenzler MD 11/21/2017 1:47 PM

## 2017-11-21 NOTE — Care Management Important Message (Signed)
Important Message  Patient Details  Name: Beverly Li MRN: [id number] Date of Birth: 1945/08/16   Medicare Important Message Given:  Yes    Nasser Ku P Carrson [employer] 11/21/2017, 3:07 PM

## 2017-11-21 NOTE — Progress Notes (Signed)
PT in stable condition. Reviewed d/c instructions with patient and husband. She got her 7 day supply of tikosyn and her script has been called into the pharmacy. Pt refused wheelchair and ambulated out in good condition.

## 2017-11-26 ENCOUNTER — Encounter (HOSPITAL_COMMUNITY): Payer: Self-pay | Admitting: Nurse Practitioner

## 2017-11-26 ENCOUNTER — Ambulatory Visit (HOSPITAL_COMMUNITY)
Admission: RE | Admit: 2017-11-26 | Discharge: 2017-11-26 | Disposition: A | Discharge status: Home / Self Care | Payer: Medicare Other | Source: Ambulatory Visit | Attending: Nurse Practitioner | Admitting: Nurse Practitioner

## 2017-11-26 VITALS — BP 100/66 | HR 51 | Ht 65.0 in | Wt 121.0 lb

## 2017-11-26 DIAGNOSIS — Z885 Allergy status to narcotic agent status: Secondary | ICD-10-CM | POA: Insufficient documentation

## 2017-11-26 DIAGNOSIS — Z79899 Other long term (current) drug therapy: Secondary | ICD-10-CM | POA: Insufficient documentation

## 2017-11-26 DIAGNOSIS — I4892 Unspecified atrial flutter: Secondary | ICD-10-CM | POA: Insufficient documentation

## 2017-11-26 DIAGNOSIS — Z87891 Personal history of nicotine dependence: Secondary | ICD-10-CM | POA: Diagnosis not present

## 2017-11-26 DIAGNOSIS — I4891 Unspecified atrial fibrillation: Secondary | ICD-10-CM | POA: Insufficient documentation

## 2017-11-26 DIAGNOSIS — F419 Anxiety disorder, unspecified: Secondary | ICD-10-CM | POA: Insufficient documentation

## 2017-11-26 DIAGNOSIS — Z7982 Long term (current) use of aspirin: Secondary | ICD-10-CM | POA: Insufficient documentation

## 2017-11-26 DIAGNOSIS — I422 Other hypertrophic cardiomyopathy: Secondary | ICD-10-CM | POA: Insufficient documentation

## 2017-11-26 DIAGNOSIS — I484 Atypical atrial flutter: Secondary | ICD-10-CM

## 2017-11-26 DIAGNOSIS — Z880 Allergy status to penicillin: Secondary | ICD-10-CM | POA: Insufficient documentation

## 2017-11-26 DIAGNOSIS — I4581 Long QT syndrome: Secondary | ICD-10-CM | POA: Diagnosis not present

## 2017-11-26 DIAGNOSIS — Z882 Allergy status to sulfonamides status: Secondary | ICD-10-CM | POA: Insufficient documentation

## 2017-11-26 DIAGNOSIS — M8580 Other specified disorders of bone density and structure, unspecified site: Secondary | ICD-10-CM | POA: Diagnosis not present

## 2017-11-26 DIAGNOSIS — I739 Peripheral vascular disease, unspecified: Secondary | ICD-10-CM | POA: Insufficient documentation

## 2017-11-26 DIAGNOSIS — G4700 Insomnia, unspecified: Secondary | ICD-10-CM | POA: Insufficient documentation

## 2017-11-26 DIAGNOSIS — Z7901 Long term (current) use of anticoagulants: Secondary | ICD-10-CM | POA: Diagnosis not present

## 2017-11-26 DIAGNOSIS — Z8249 Family history of ischemic heart disease and other diseases of the circulatory system: Secondary | ICD-10-CM | POA: Insufficient documentation

## 2017-11-26 DIAGNOSIS — K219 Gastro-esophageal reflux disease without esophagitis: Secondary | ICD-10-CM | POA: Insufficient documentation

## 2017-11-26 DIAGNOSIS — I119 Hypertensive heart disease without heart failure: Secondary | ICD-10-CM | POA: Insufficient documentation

## 2017-11-26 DIAGNOSIS — I444 Left anterior fascicular block: Secondary | ICD-10-CM | POA: Diagnosis not present

## 2017-11-26 LAB — BASIC METABOLIC PANEL
ANION GAP: 11 (ref 5–15)
BUN: 26 mg/dL — ABNORMAL HIGH (ref 8–23)
CHLORIDE: 103 mmol/L (ref 98–111)
CO2: 27 mmol/L (ref 22–32)
Calcium: 9.7 mg/dL (ref 8.9–10.3)
Creatinine, Ser: 0.87 mg/dL (ref 0.44–1.00)
GFR calc non Af Amer: >60 mL/min (ref >60)
GLUCOSE: 156 mg/dL — AB (ref 70–99)
POTASSIUM: 4.6 mmol/L (ref 3.5–5.1)
Sodium: 141 mmol/L (ref 135–145)

## 2017-11-26 LAB — MAGNESIUM: Magnesium: 2.3 mg/dL (ref 1.7–2.4)

## 2017-11-26 NOTE — Progress Notes (Signed)
Primary Care Physician: Clayborn Heronankins, Victoria R, MD Cardiologist: Dr. Anne FuSkains  EP: Dr. Graciela HusbandsKlein Referring: Elesa [address] triage    Beverly MeekerFrances B Voisin is a 72 y.o. female with a h/o HOCM, symptomatic brady with syncope,atrial flutter, s/p successful  cardioversion in June, with recurrent arrhythmia as of  this am.Ekg shows atypical atrial  flutter vrs atrial  tach at 106 bpm. When it first started this am , she had symptomatic RVR and felt poorly. Her BP is soft at 96/64 and has h/o of autonomic dysfunction with liable HTN and significant issues with past sedation and drops  in BP.  She has a history of labile hypertension and during vascular procedure 11/16 she developed hypotension and suffered a respiratory arrest.  She underwent intubation and phenylephrine infusion.  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  1/18 she underwent catheterization for chest pain.  No coronary disease was noted but she had significant postprocedural hypertension and received IV hydralazine.  She became profoundly bradycardic and hypotensive again requiring Neo-Synephrine.  She developed respiratory distress but did not require intubation.  She has been on amiodarone in the remote past but was stopped secondary to potential side effects and currently pt suffers from peripheral neuropathy and does not want to use this drug again. When seen initially by Dr. Graciela HusbandsKlein in June,  he set up cardioversion with special attention to anesthesia, but did not mention in his note, future plans if pt should go back to aflutter.  Since it seems that I will not be able to add rate control for pt's low BP, Dr. Julieanne CottonKllein, being on vacation, I dicussed with Dr. Anne FuSkains, he  was able to come to the office and discuss with the pt as well. He feels that she needs to be on antiarrythmic, he  discussed as well with Dr. Elberta Fortisamnitz. It was then discussed with pt pursing admission for Tikosyn load and she is in agreement.   She does not drink  alcohol, uses some  caffeine. Was in usual stare of health when this started, no obvious trigger.  F/u in afib clinic 8/6 for Tikosyn admit. She did convert to SR last night. Feels well today. Is aware of price of drug and is ready to come into hospital for drug. PharmD screened drugs and no qt prolonging drugs on board.   F/u in afib clinic, 8/14. She is here s/p Tikosyn load. She is feeling lightheaded today with her BP at 100/66, she takes midodrine for this as needed. She will take one when she gets home. She is compliant with her Tikosyn.  Today, she denies symptoms of palpitations, chest pain, shortness of breath, orthopnea, PND, lower extremity edema, dizziness, presyncope, syncope, or neurologic sequela. The patient is tolerating medications without difficulties and is otherwise without complaint today.   Past Medical History:  Diagnosis Date  . Anxiety   . Arthritis    "some in my lower back; probably elbows, knees" (11/18/2017)  . Atrial fibrillation (HCC)   . Bell's palsy    when pt. was 72 yrs old, when under stress the left side of face will droop.  . Complication of anesthesia    "vascular OR 2016; BP bottomed out; couldn't get it regulated; ended up in ICU for DAYS" (11/18/2017)  . GERD (gastroesophageal reflux disease)   . Heart murmur   . History of kidney stones   . Hypertension   . Hypertrophic cardiomyopathy (HCC)    severe LV basilar hypertrophy witn no evidence of  significant outflow tract obstruction, EF 65-70%, mild LAE, mild TR, grade 1a diastolic dysfunction 05/15/10 (Dr. Donato Schultz) (Atrial Septal Hypertrophy pattern)-- Intra-op TEE with dsignificant outflow tract obstruction - AI, MR & TR  . Insomnia   . Mild aortic sclerosis (HCC)   . Osteopenia   . Peripheral vascular disease (HCC)   . Syncope    , Vagal   Past Surgical History:  Procedure Laterality Date  . AUGMENTATION MAMMAPLASTY Bilateral   . BACK SURGERY    . CARDIAC CATHETERIZATION N/A 05/07/2016    Procedure: Left Heart Cath and Coronary Angiography;  Surgeon: Kathleene Hazel, MD;  Location: [facility] INVASIVE CV LAB;  Service: Cardiovascular;  Laterality: N/A;  . CARDIOVERSION N/A 09/24/2017   Procedure: CARDIOVERSION;  Surgeon: Laurey Morale, MD;  Location: [facility] ENDOSCOPY;  Service: Cardiovascular;  Laterality: N/A;  . DILATION AND CURETTAGE OF UTERUS    . ENDARTERECTOMY FEMORAL Right 03/02/2015   Procedure: ENDARTERECTOMY RIGHT FEMORAL;  Surgeon: Chuck Hint, MD;  Location: [facility] OR;  Service: Vascular;  Laterality: Right;  . FACIAL COSMETIC SURGERY Left 2002   "related to Bell's Palsy @ age 77; left eye/side of face droopy; tried to make area symmetrical"  . FEMORAL-POPLITEAL BYPASS GRAFT Right 03/02/2015   Procedure: BYPASS GRAFT FEMORAL-BELOW KNEE POPLITEAL ARTERY;  Surgeon: Chuck Hint, MD;  Location: [facility] OR;  Service: Vascular;  Laterality: Right;  . INGUINAL HERNIA REPAIR Bilateral 2002  . OVARIAN CYST REMOVAL Left   . PERIPHERAL VASCULAR CATHETERIZATION N/A 01/16/2015   Procedure: Abdominal Aortogram;  Surgeon: Chuck Hint, MD;  Location: [facility] INVASIVE CV LAB;  Service: Cardiovascular;  Laterality: N/A;  . POSTERIOR LUMBAR FUSION  2015   "have plates and screws in there"  . TONSILLECTOMY      Current Outpatient Medications  Medication Sig Dispense Refill  . acetaminophen (TYLENOL) 500 MG tablet Take 1,500 mg by mouth every 6 (six) hours as needed (for pain.).    [name] alendronate (FOSAMAX) 70 MG tablet Take 70 mg by mouth every Monday. Take with a full glass of water on an empty stomach.    [name] aspirin EC 81 MG tablet Take 81 mg by mouth daily.    . Brimonidine Tartrate (LUMIFY) 0.025 % SOLN Place 1 drop into both eyes daily as needed (for dry eyes).    [name] buPROPion (WELLBUTRIN XL) 150 MG 24 hr tablet Take 150 mg by mouth every morning.     . Calcium-Phosphorus-Vitamin D (CALCIUM/D3 ADULT GUMMIES PO) Take 2 tablets by mouth daily at 2 PM.    . DEXILANT 30 MG  capsule Take 30 mg by mouth daily before breakfast.     . diltiazem (CARDIZEM) 30 MG tablet Take 30 mg by mouth daily as needed (blood pressure higher than 160).     . dofetilide (TIKOSYN) 250 MCG capsule Take 1 capsule (250 mcg total) by mouth 2 (two) times daily. 60 capsule 6  . ELIQUIS 5 MG TABS tablet TAKE 1 TABLET(5 MG) BY MOUTH TWICE DAILY 60 tablet 9  . ezetimibe (ZETIA) 10 MG tablet Take 10 mg by mouth daily.    . hydrALAZINE (APRESOLINE) 25 MG tablet TAKE 1 TABLET BY MOUTH EVERY 8 HOURS AS NEEDED FOR SYSTOLIC BLOOD PRESSURE(TOP NUMBER)>160 270 tablet 0  . midodrine (PROAMATINE) 10 MG tablet Take 10 mg by mouth 3 (three) times daily with meals as needed (for systolic blood pressure lower than 100).  90 tablet 1  . Multiple Vitamins-Iron (STRESS B COMPLEX/IRON) TABS Take  1 tablet by mouth at bedtime.     . nebivolol (BYSTOLIC) 2.5 MG tablet Take 1 tablet (2.5 mg total) by mouth at bedtime. 30 tablet 6  . Polyethyl Glycol-Propyl Glycol (SYSTANE) 0.4-0.3 % GEL ophthalmic gel Place 1 application into both eyes daily as needed (for eye irritation/dryness.).    [name] potassium chloride (K-DUR,KLOR-CON) 10 MEQ tablet Take 1 tablet (10 mEq total) by mouth daily. 30 tablet 6  . PROAIR HFA 108 (90 Base) MCG/ACT inhaler Inhale 2 puffs into the lungs 4 (four) times daily as needed (for wheezing/shortness of breath.).   1  . rosuvastatin (CRESTOR) 5 MG tablet Take 2.5 mg by mouth See admin instructions. Take 2.5mg  by mouth 5 days weekly    . zolpidem (AMBIEN) 5 MG tablet Take 5 mg by mouth at bedtime.      No current facility-administered medications for this encounter.     Allergies  Allergen Reactions  . Amoxicillin Other (See Comments)    UTI Has patient had a PCN reaction causing immediate rash, facial/tongue/throat swelling, SOB or lightheadedness with hypotension: No Has patient had a PCN reaction causing severe rash involving mucus membranes or skin necrosis: No Has patient had a PCN reaction  that required hospitalization: No Has patient had a PCN reaction occurring within the last 10 years: Yes--UTI ONLY If all of the above answers are "NO", then may proceed with Cephalosporin use.   . Atenolol Cough  . Crestor [Rosuvastatin Calcium] Other (See Comments)    Muscle aches  . Pravastatin Other (See Comments)    Muscle aches  . Sulfa Antibiotics Nausea Only  . Codeine Rash    Social History   Socioeconomic History  . Marital status: Married    Spouse name: Not on file  . Number of children: Not on file  . Years of education: Not on file  . Highest education level: Not on file  Occupational History  . Not on file  Social Needs  . Financial resource strain: Not on file  . Food insecurity:    Worry: Not on file    Inability: Not on file  . Transportation needs:    Medical: Not on file    Non-medical: Not on file  Tobacco Use  . Smoking status: Former Smoker    Packs/day: 1.00    Years: 50.00    Pack years: 50.00    Types: Cigarettes    Last attempt to quit: 12/15/2014    Years since quitting: 2.9  . Smokeless tobacco: Never Used  Substance and Sexual Activity  . Alcohol use: Yes    Comment: 11/18/2017 "might have a couple glasses of wine/month; if that"  . Drug use: Never  . Sexual activity: Not Currently  Lifestyle  . Physical activity:    Days per week: Not on file    Minutes per session: Not on file  . Stress: Not on file  Relationships  . Social connections:    Talks on phone: Not on file    Gets together: Not on file    Attends religious service: Not on file    Active member of club or organization: Not on file    Attends meetings of clubs or organizations: Not on file    Relationship status: Not on file  . Intimate partner violence:    Fear of current or ex partner: Not on file    Emotionally abused: Not on file    Physically abused: Not on file    Forced sexual  activity: Not on file  Other Topics Concern  . Not on file  Social History  Narrative  . Not on file    Family History  Problem Relation Age of Onset  . Liver cancer Mother   . Cancer Mother        Liver  . Hypertension Mother   . Lung cancer Father   . Cancer Father        Lung  . Breast cancer Sister   . Cancer Sister        Breast    ROS- All systems are reviewed and negative except as per the HPI above  Physical Exam: Vitals:   11/26/17 1542  BP: 100/66  Pulse: (!) 51  Weight: 54.9 kg  Height: 5\' 5"  (1.651 m)   Wt Readings from Last 3 Encounters:  11/26/17 54.9 kg  11/21/17 55.2 kg  11/18/17 55.8 kg    Labs: Lab Results  Component Value Date   NA 141 11/21/2017   K 4.3 11/21/2017   CL 103 11/21/2017   CO2 26 11/21/2017   GLUCOSE 77 11/21/2017   BUN 23 11/21/2017   CREATININE 0.67 11/21/2017   CALCIUM 9.6 11/21/2017   PHOS 3.2 03/14/2015   MG 2.1 11/21/2017   Lab Results  Component Value Date   INR 1.0 04/30/2016   Lab Results  Component Value Date   CHOL 211 (H) 09/16/2016   HDL 97 09/16/2016   LDLCALC 98 09/16/2016   TRIG 78 09/16/2016     GEN- The patient is well appearing, alert and oriented x 3 today.   Head- normocephalic, atraumatic Eyes-  Sclera clear, conjunctiva pink Ears- hearing intact Oropharynx- clear Neck- supple, no JVP Lymph- no cervical lymphadenopathy Lungs- Clear to ausculation bilaterally, normal work of breathing Heart- Regular rate and rhythm, no murmurs, rubs or gallops, PMI not laterally displaced GI- soft, NT, ND, + BS Extremities- no clubbing, cyanosis, or edema MS- no significant deformity or atrophy Skin- no rash or lesion Psych- euthymic mood, full affect Neuro- strength and sensation are intact  EKG-Probable atypical atrial flutter vrs atrial tach at 106 bpm    Assessment and Plan: 1. Paroxysmal  Atrial flutter S/p Tikosyn loading Reminded to take on a regular basis IS compliant  with drug, conintue 250 mcg bid Reminded no benadryl use Qtc today is 495 ms Bmet/mag drawn     F/u next Tuesday for EKG to check qtc to makes sure is staying stable  Lupita LeashDonna C. Matthew Folksarroll, ANP-C [facility] [address] [place], [place] [address] [phone]

## 2017-11-28 ENCOUNTER — Ambulatory Visit (HOSPITAL_COMMUNITY): Payer: Medicare Other | Admitting: Nurse Practitioner

## 2017-12-01 ENCOUNTER — Telehealth: Payer: Self-pay | Admitting: Internal Medicine

## 2017-12-01 DIAGNOSIS — I421 Obstructive hypertrophic cardiomyopathy: Secondary | ICD-10-CM

## 2017-12-01 DIAGNOSIS — G909 Disorder of the autonomic nervous system, unspecified: Secondary | ICD-10-CM

## 2017-12-01 NOTE — Telephone Encounter (Signed)
Thanks for update. Very challenging. Immense swings in BP thought to be in part caused by both hypervagal episodes as well as HOCM. Remember, this very phenomenon was present when she was post op in hospital in ICU. The atrial flutter was not helping the situation, hence the Tikosyn.   Let's have her see Lupita LeashDonna tomorrow. Keep hydrated. Will follow closely.   Donato SchultzMark Skains, MD

## 2017-12-01 NOTE — Telephone Encounter (Signed)
Spoke with Dr. Graciela HusbandsKlein.   We would like to refer her to [facility] Donale[facility]rinAndrew Wong [facility] to evaluate her HOCM. We think this would be a valuable next step in her care  Thanks Pam.   Donato SchultzMark Skains, MD

## 2017-12-01 NOTE — Telephone Encounter (Signed)
New message:      Pt c/o BP issue: STAT if pt c/o blurred vision, one-sided weakness or slurred speech  1. What are your last 5 BP readings? 80/50(last night) ,140/90, 77/56  2. Are you having any other symptoms (ex. Dizziness, headache, blurred vision, passed out)? Blurred vision and unable to move/nausea  3. What is your BP issue? Pt states she has been having these drops since last Wednesday    *pt states she has an appt tomorrow with the afib clinic and wants to know is this something they can look into or can she get a sooner appt with Graciela HusbandsKlein

## 2017-12-01 NOTE — Telephone Encounter (Signed)
Left message for pt to c/b to discuss being referred to Dr Nolon RodAndrew Wang at [facility].  [address]  [place], [place] [address]  [phone] Referral placed.

## 2017-12-01 NOTE — Telephone Encounter (Signed)
Per pt - today she is feeling OK but BP is 180/105 on last check.  Yesterday her BP dropped X 2 to 77/56 and the other day this occurred 4 times in a day.  She becomes extremely weak and requires help when these episodes occur.  She feels they are worse since starting Tikosyn.  In the past she has had these episodes on occasion but not as much as she is having now.  She is staying very well hydrated and try medications as listed.  Advised I will review with Dr Anne FuSkains and c/b with further instructions.  [facility].

## 2017-12-02 ENCOUNTER — Ambulatory Visit (HOSPITAL_COMMUNITY)
Admission: RE | Admit: 2017-12-02 | Discharge: 2017-12-02 | Disposition: A | Discharge status: Home / Self Care | Payer: Medicare Other | Source: Ambulatory Visit | Attending: Nurse Practitioner | Admitting: Nurse Practitioner

## 2017-12-02 DIAGNOSIS — I4892 Unspecified atrial flutter: Secondary | ICD-10-CM | POA: Diagnosis not present

## 2017-12-02 NOTE — Progress Notes (Signed)
Pt in for repeat EKG, to  further evaluate Qt interval on tikosyn, as it was  495 ms on 8/14. Today EKG shows SR at 51 bpm,. PR int 190 ms, qrs int 88 ms, qtc 482 ms, improved today.. She has had more issues with hypotension since starting tikosyn. She feels well today. Dr. Anne FuSkains has talked to Dr. Graciela HusbandsKlein and she is going to be referred to [facility] at [facility]. She has f/u with Dr. Graciela HusbandsKlein 9/24.

## 2017-12-17 NOTE — Addendum Note (Signed)
Encounter addended by: Newman Nip, NP on: 12/17/2017 2:45 PM  Actions taken: LOS modified

## 2017-12-24 ENCOUNTER — Ambulatory Visit: Payer: Medicare Other | Admitting: Internal Medicine

## 2018-01-06 ENCOUNTER — Encounter: Payer: Self-pay | Admitting: Internal Medicine

## 2018-01-06 ENCOUNTER — Ambulatory Visit: Payer: Medicare Other | Admitting: Internal Medicine

## 2018-01-06 VITALS — BP 146/100 | HR 53 | Ht 63.0 in | Wt 123.4 lb

## 2018-01-06 DIAGNOSIS — I421 Obstructive hypertrophic cardiomyopathy: Secondary | ICD-10-CM | POA: Diagnosis not present

## 2018-01-06 DIAGNOSIS — I959 Hypotension, unspecified: Secondary | ICD-10-CM

## 2018-01-06 DIAGNOSIS — I484 Atypical atrial flutter: Secondary | ICD-10-CM

## 2018-01-06 NOTE — Patient Instructions (Signed)
Medication Instructions:  Your physician recommends that you continue on your current medications as directed. Please refer to the Current Medication list given to you today.  Labwork: None ordered.  Testing/Procedures: None ordered.  Follow-Up: Your physician wants you to follow-up in:   6 months with Dr Graciela HusbandsKlein.   Please schedule an appointment to see Dr Anne FuSkains in 3 to 4 months.   Any Other Special Instructions Will Be Listed Below (If Applicable).     If you need a refill on your cardiac medications before your next appointment, please call your pharmacy.

## 2018-01-06 NOTE — Progress Notes (Signed)
Patient Care Team: Rankins, Fanny Dance, MD as PCP - General (Family Medicine) [name], MD as Consulting Physician (Cardiology) Shirlean Kelly, MD as Consulting Physician (Neurosurgery)   HPI  Beverly Li is a 72 y.o. female Seen at the request of Dr. Donato Schultz because of tachycardia.  She was found to be in atrial flutter She has a history of labile hypertension and during vascular procedure 11/16 she developed hypotension and suffered a respiratory arrest.  She underwent intubation and phenylephrine infusion.  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  1/18 she underwent catheterization for chest pain.  No coronary disease was noted but she had significant postprocedural hypertension and received IV hydralazine.  She became profoundly bradycardic and hypotensive again requiring Neo-Synephrine.  She developed respiratory distress but did not require intubation.  No interval events.  She has been seen down at [facility] as noted below.  No palpitations.  Her dyspnea.   Records and Results Reviewed   Past Medical History:  Diagnosis Date  . Anxiety   . Arthritis    "some in my lower back; probably elbows, knees" (11/18/2017)  . Atrial fibrillation (HCC)   . Bell's palsy    when pt. was 72 yrs old, when under stress the left side of face will droop.  . Complication of anesthesia    "vascular OR 2016; BP bottomed out; couldn't get it regulated; ended up in ICU for DAYS" (11/18/2017)  . GERD (gastroesophageal reflux disease)   . Heart murmur   . History of kidney stones   . Hypertension   . Hypertrophic cardiomyopathy (HCC)    severe LV basilar hypertrophy witn no evidence of significant outflow tract obstruction, EF 65-70%, mild LAE, mild TR, grade 1a diastolic dysfunction 05/15/10 (Dr. Donato Schultz) (Atrial Septal Hypertrophy pattern)-- Intra-op TEE with dsignificant outflow tract obstruction - AI, MR & TR  . Insomnia   . Mild aortic sclerosis  (HCC)   . Osteopenia   . Peripheral vascular disease (HCC)   . Syncope    , Vagal    Past Surgical History:  Procedure Laterality Date  . AUGMENTATION MAMMAPLASTY Bilateral   . BACK SURGERY    . CARDIAC CATHETERIZATION N/A 05/07/2016   Procedure: Left Heart Cath and Coronary Angiography;  Surgeon: Kathleene Hazel, MD;  Location: [facility] INVASIVE CV LAB;  Service: Cardiovascular;  Laterality: N/A;  . CARDIOVERSION N/A 09/24/2017   Procedure: CARDIOVERSION;  Surgeon: Laurey Morale, MD;  Location: [facility] ENDOSCOPY;  Service: Cardiovascular;  Laterality: N/A;  . DILATION AND CURETTAGE OF UTERUS    . ENDARTERECTOMY FEMORAL Right 03/02/2015   Procedure: ENDARTERECTOMY RIGHT FEMORAL;  Surgeon: Chuck Hint, MD;  Location: [facility] OR;  Service: Vascular;  Laterality: Right;  . FACIAL COSMETIC SURGERY Left 2002   "related to Bell's Palsy @ age 10; left eye/side of face droopy; tried to make area symmetrical"  . FEMORAL-POPLITEAL BYPASS GRAFT Right 03/02/2015   Procedure: BYPASS GRAFT FEMORAL-BELOW KNEE POPLITEAL ARTERY;  Surgeon: Chuck Hint, MD;  Location: [facility] OR;  Service: Vascular;  Laterality: Right;  . INGUINAL HERNIA REPAIR Bilateral 2002  . OVARIAN CYST REMOVAL Left   . PERIPHERAL VASCULAR CATHETERIZATION N/A 01/16/2015   Procedure: Abdominal Aortogram;  Surgeon: Chuck Hint, MD;  Location: [facility] INVASIVE CV LAB;  Service: Cardiovascular;  Laterality: N/A;  . POSTERIOR LUMBAR FUSION  2015   "have plates and screws in there"  . TONSILLECTOMY      Current  Meds  Medication Sig  . acetaminophen (TYLENOL) 500 MG tablet Take 1,500 mg by mouth every 6 (six) hours as needed (for pain.).  [name]. alendronate (FOSAMAX) 70 MG tablet Take 70 mg by mouth every Monday. Take with a full glass of water on an empty stomach.  [name]. aspirin EC 81 MG tablet Take 81 mg by mouth daily.  . Brimonidine Tartrate (LUMIFY) 0.025 % SOLN Place 1 drop into both eyes daily as needed (for dry eyes).  [name].  buPROPion (WELLBUTRIN XL) 150 MG 24 hr tablet Take 150 mg by mouth every morning.   . Calcium-Phosphorus-Vitamin D (CALCIUM/D3 ADULT GUMMIES PO) Take 2 tablets by mouth daily at 2 PM.  . DEXILANT 30 MG capsule Take 30 mg by mouth daily before breakfast.   . diltiazem (CARDIZEM) 30 MG tablet Take 30 mg by mouth daily as needed (blood pressure higher than 160).   . dofetilide (TIKOSYN) 250 MCG capsule Take 1 capsule (250 mcg total) by mouth 2 (two) times daily.  [name]. ELIQUIS 5 MG TABS tablet TAKE 1 TABLET(5 MG) BY MOUTH TWICE DAILY  . ezetimibe (ZETIA) 10 MG tablet Take 10 mg by mouth daily.  . hydrALAZINE (APRESOLINE) 25 MG tablet TAKE 1 TABLET BY MOUTH EVERY 8 HOURS AS NEEDED FOR SYSTOLIC BLOOD PRESSURE(TOP NUMBER)>160  . midodrine (PROAMATINE) 10 MG tablet Take 10 mg by mouth 3 (three) times daily with meals as needed (for systolic blood pressure lower than 100).   . Multiple Vitamins-Iron (STRESS B COMPLEX/IRON) TABS Take 1 tablet by mouth at bedtime.   . nebivolol (BYSTOLIC) 2.5 MG tablet Take 1 tablet (2.5 mg total) by mouth at bedtime.  Bertram Gala. Polyethyl Glycol-Propyl Glycol (SYSTANE) 0.4-0.3 % GEL ophthalmic gel Place 1 application into both eyes daily as needed (for eye irritation/dryness.).  [name]. potassium chloride (K-DUR,KLOR-CON) 10 MEQ tablet Take 1 tablet (10 mEq total) by mouth daily.  [name]. PROAIR HFA 108 (90 Base) MCG/ACT inhaler Inhale 2 puffs into the lungs 4 (four) times daily as needed (for wheezing/shortness of breath.).   [name]. rosuvastatin (CRESTOR) 5 MG tablet Take 2.5 mg by mouth See admin instructions. Take 2.5mg  by mouth 5 days weekly  . zolpidem (AMBIEN) 5 MG tablet Take 5 mg by mouth at bedtime.     Allergies  Allergen Reactions  . Amoxicillin Other (See Comments)    UTI Has patient had a PCN reaction causing immediate rash, facial/tongue/throat swelling, SOB or lightheadedness with hypotension: No Has patient had a PCN reaction causing severe rash involving mucus membranes or skin  necrosis: No Has patient had a PCN reaction that required hospitalization: No Has patient had a PCN reaction occurring within the last 10 years: Yes--UTI ONLY If all of the above answers are "NO", then may proceed with Cephalosporin use.   . Atenolol Cough  . Crestor [Rosuvastatin Calcium] Other (See Comments)    Muscle aches  . Pravastatin Other (See Comments)    Muscle aches  . Sulfa Antibiotics Nausea Only  . Codeine Rash      Review of Systems negative except from HPI and PMH  Physical Exam BP (!) 146/100   Pulse (!) 53   Ht 5\' 3"  (1.6 m)   Wt 123 lb 6.4 oz (56 kg)   LMP  (LMP Unknown)   BMI 21.86 kg/m  Well developed and nourished in no acute distress HENT normal Neck supple with JVP-flat Clear Regular rate and rhythm, no murmurs or gallops Abd-soft with active BS No Clubbing cyanosis edema Skin-warm  and dry A & Oriented  Grossly normal sensory and motor function  Sinus at 63 Intervals 20/09/47 LVH with repo  Assessment and  Plan  Hypertrophic obstructive cardiomyopathy  Sinus bradycardia  Hypertension   Vasovagal events    The patient has seen Dr. Nolon Rod at [facility].  His records were reviewed.  He did not feel that there is a likely associated diagnosis for his her orthostasis and her hypertrophic heart disease.  Recommended as needed Bystolic which she is doing.  Gene testing was undertaken.  She has not yet heard.  She and I reviewed the implications of gene test positive and negative.  She will continue to use ProAmatine as needed and hydralazine as needed.  We spent more than 50% of our >25 min visit in face to face counseling regarding the above      Current medicines are reviewed at length with the patient today .  The patient does not  have concerns regarding medicines.

## 2018-01-15 NOTE — Telephone Encounter (Signed)
Pt was seen in consult by Dr Regino Schultze at [facility].

## 2018-01-20 ENCOUNTER — Ambulatory Visit: Payer: Medicare Other | Admitting: Cardiology

## 2018-02-11 ENCOUNTER — Encounter (HOSPITAL_COMMUNITY): Payer: Self-pay

## 2018-02-11 ENCOUNTER — Encounter (HOSPITAL_COMMUNITY): Payer: Medicare Other

## 2018-02-11 ENCOUNTER — Ambulatory Visit (HOSPITAL_COMMUNITY)
Admission: RE | Admit: 2018-02-11 | Discharge: 2018-02-11 | Disposition: A | Discharge status: Home / Self Care | Payer: Medicare Other | Source: Ambulatory Visit | Attending: Family Medicine | Admitting: Family Medicine

## 2018-02-11 ENCOUNTER — Other Ambulatory Visit (HOSPITAL_COMMUNITY): Payer: Medicare Other

## 2018-02-11 DIAGNOSIS — I779 Disorder of arteries and arterioles, unspecified: Secondary | ICD-10-CM

## 2018-02-11 DIAGNOSIS — Z95828 Presence of other vascular implants and grafts: Secondary | ICD-10-CM

## 2018-02-11 DIAGNOSIS — Z87891 Personal history of nicotine dependence: Secondary | ICD-10-CM

## 2018-02-12 ENCOUNTER — Ambulatory Visit: Payer: Medicare Other | Admitting: Family

## 2018-02-12 ENCOUNTER — Other Ambulatory Visit: Payer: Self-pay

## 2018-02-12 ENCOUNTER — Ambulatory Visit (HOSPITAL_COMMUNITY)
Admission: RE | Admit: 2018-02-12 | Discharge: 2018-02-12 | Disposition: A | Discharge status: Home / Self Care | Payer: Medicare Other | Source: Ambulatory Visit | Attending: Family Medicine | Admitting: Family Medicine

## 2018-02-12 ENCOUNTER — Encounter: Payer: Self-pay | Admitting: Family

## 2018-02-12 ENCOUNTER — Ambulatory Visit (INDEPENDENT_AMBULATORY_CARE_PROVIDER_SITE_OTHER)
Admission: RE | Admit: 2018-02-12 | Discharge: 2018-02-12 | Disposition: A | Discharge status: Home / Self Care | Payer: Medicare Other | Source: Ambulatory Visit | Attending: Family Medicine | Admitting: Family Medicine

## 2018-02-12 VITALS — BP 166/93 | HR 51 | Temp 97.2°F | Resp 14 | Ht 64.0 in | Wt 123.0 lb

## 2018-02-12 DIAGNOSIS — I779 Disorder of arteries and arterioles, unspecified: Secondary | ICD-10-CM | POA: Insufficient documentation

## 2018-02-12 DIAGNOSIS — Z95828 Presence of other vascular implants and grafts: Secondary | ICD-10-CM

## 2018-02-12 DIAGNOSIS — R0989 Other specified symptoms and signs involving the circulatory and respiratory systems: Secondary | ICD-10-CM | POA: Diagnosis not present

## 2018-02-12 DIAGNOSIS — Z87891 Personal history of nicotine dependence: Secondary | ICD-10-CM | POA: Insufficient documentation

## 2018-02-12 NOTE — Progress Notes (Signed)
[facility] [place] HISTORY AND PHYSICAL   CC: Follow up peripheral artery occlusive disease   History of Present Illness:   Beverly Li is a 72 y.o. female who presents for follow up ABI's and by bypass duplex s/p right fem to below knee popliteal by passwith composite vein, 2 pieces of right GSV spliced together, on 03-02-15 by Dr. Edilia Bo. She has continued to exercise daily and is eating a healthy diet.   She was having such severehip pain bilaterally she could barely walk. Once the statin drugs were stopped her hip pain resolved, but she has gradually increased her statin usage from a couple of times/week to daily, and now doubts whether the statin is causing any hip pain. She continues to take a daily aspirin, beta blocker and Eliquis.  Dr. Edilia Bo last evaluated pt on 02-21-16. At that time pt was very happy with her surgical out come. She didnot want to continue to try statin medication due to an intolerance,statedher total cholesterol was <200. We recommend good eating habits with a plant and fruit based intake.  She was tof/u in 6 months for repeat ABI's and right by pass arterial duplex.  She no longer has left calf pain after walking 15 minutes, has no claudication in her legs with walking. She had no claudication at her visit in July 2018.  She denies any claudication sx's in her right leg with walking.  She walks a mile daily on a treadmill with no claudication.    She denies any known history of stroke or TIA.  She denies tingling or numbness in either upper extremity.   She had Bell's Palsy years ago and has had left side facial droop due to this.  She has had problems with hypotension, and takes medication as needed for hypertension.   She knows that she has a heart murmur, denies dyspnea, denies unusual fatigue, denies chest pain.   Diabetic:No Tobacco ZOX:WRUEAV smoker, quiton 12-15-14 after 50years smoking  Pt  meds include: Statin :low dose Crestor daily. Betablocker:Yes ASA:Yes Other anticoagulants/antiplatelets:Eliquis, has atrial flutter    Current Outpatient Medications  Medication Sig Dispense Refill  . acetaminophen (TYLENOL) 500 MG tablet Take 1,500 mg by mouth every 6 (six) hours as needed (for pain.).    [name] alendronate (FOSAMAX) 70 MG tablet Take 70 mg by mouth every Monday. Take with a full glass of water on an empty stomach.    [name] aspirin EC 81 MG tablet Take 81 mg by mouth daily.    . Brimonidine Tartrate (LUMIFY) 0.025 % SOLN Place 1 drop into both eyes daily as needed (for dry eyes).    [name] buPROPion (WELLBUTRIN XL) 150 MG 24 hr tablet Take 150 mg by mouth every morning.     . Calcium-Phosphorus-Vitamin D (CALCIUM/D3 ADULT GUMMIES PO) Take 2 tablets by mouth daily at 2 PM.    . DEXILANT 30 MG capsule Take 30 mg by mouth daily before breakfast.     . diltiazem (CARDIZEM) 30 MG tablet Take 30 mg by mouth daily as needed (blood pressure higher than 160).     . dofetilide (TIKOSYN) 250 MCG capsule Take 1 capsule (250 mcg total) by mouth 2 (two) times daily. 60 capsule 6  . ELIQUIS 5 MG TABS tablet TAKE 1 TABLET(5 MG) BY MOUTH TWICE DAILY 60 tablet 9  . ezetimibe (ZETIA) 10 MG tablet Take 10 mg by mouth daily.    . hydrALAZINE (APRESOLINE) 25 MG tablet TAKE 1 TABLET BY  MOUTH EVERY 8 HOURS AS NEEDED FOR SYSTOLIC BLOOD PRESSURE(TOP NUMBER)>160 270 tablet 0  . midodrine (PROAMATINE) 10 MG tablet Take 10 mg by mouth 3 (three) times daily with meals as needed (for systolic blood pressure lower than 100).  90 tablet 1  . Multiple Vitamins-Iron (STRESS B COMPLEX/IRON) TABS Take 1 tablet by mouth at bedtime.     . nebivolol (BYSTOLIC) 2.5 MG tablet Take 1 tablet (2.5 mg total) by mouth at bedtime. 30 tablet 6  . Polyethyl Glycol-Propyl Glycol (SYSTANE) 0.4-0.3 % GEL ophthalmic gel Place 1 application into both eyes daily as needed (for eye irritation/dryness.).    [name] potassium chloride  (K-DUR,KLOR-CON) 10 MEQ tablet Take 1 tablet (10 mEq total) by mouth daily. 30 tablet 6  . PROAIR HFA 108 (90 Base) MCG/ACT inhaler Inhale 2 puffs into the lungs 4 (four) times daily as needed (for wheezing/shortness of breath.).   1  . rosuvastatin (CRESTOR) 5 MG tablet Take 2.5 mg by mouth See admin instructions. Take 2.5mg  by mouth 5 days weekly    . zolpidem (AMBIEN) 5 MG tablet Take 5 mg by mouth at bedtime.      No current facility-administered medications for this visit.     Past Medical History:  Diagnosis Date  . Anxiety   . Arthritis    "some in my lower back; probably elbows, knees" (11/18/2017)  . Atrial fibrillation (HCC)   . Bell's palsy    when pt. was 72 yrs old, when under stress the left side of face will droop.  . Complication of anesthesia    "vascular OR 2016; BP bottomed out; couldn't get it regulated; ended up in ICU for DAYS" (11/18/2017)  . GERD (gastroesophageal reflux disease)   . Heart murmur   . History of kidney stones   . Hypertension   . Hypertrophic cardiomyopathy (HCC)    severe LV basilar hypertrophy witn no evidence of significant outflow tract obstruction, EF 65-70%, mild LAE, mild TR, grade 1a diastolic dysfunction 05/15/10 (Dr. Donato Schultz) (Atrial Septal Hypertrophy pattern)-- Intra-op TEE with dsignificant outflow tract obstruction - AI, MR & TR  . Insomnia   . Mild aortic sclerosis (HCC)   . Osteopenia   . Peripheral vascular disease (HCC)   . Syncope    , Vagal    Social History Social History   Tobacco Use  . Smoking status: Former Smoker    Packs/day: 1.00    Years: 50.00    Pack years: 50.00    Types: Cigarettes    Last attempt to quit: 12/15/2014    Years since quitting: 3.1  . Smokeless tobacco: Never Used  Substance Use Topics  . Alcohol use: Yes    Comment: 11/18/2017 "might have a couple glasses of wine/month; if that"  . Drug use: Never    Family History Family History  Problem Relation Age of Onset  . Liver cancer Mother    . Cancer Mother        Liver  . Hypertension Mother   . Lung cancer Father   . Cancer Father        Lung  . Breast cancer Sister   . Cancer Sister        Breast    Surgical History Past Surgical History:  Procedure Laterality Date  . AUGMENTATION MAMMAPLASTY Bilateral   . BACK SURGERY    . CARDIAC CATHETERIZATION N/A 05/07/2016   Procedure: Left Heart Cath and Coronary Angiography;  Surgeon: Kathleene Hazel, MD;  Location:  [facility] INVASIVE CV LAB;  Service: Cardiovascular;  Laterality: N/A;  . CARDIOVERSION N/A 09/24/2017   Procedure: CARDIOVERSION;  Surgeon: Laurey Morale, MD;  Location: [facility] ENDOSCOPY;  Service: Cardiovascular;  Laterality: N/A;  . DILATION AND CURETTAGE OF UTERUS    . ENDARTERECTOMY FEMORAL Right 03/02/2015   Procedure: ENDARTERECTOMY RIGHT FEMORAL;  Surgeon: Chuck Hint, MD;  Location: [facility] OR;  Service: Vascular;  Laterality: Right;  . FACIAL COSMETIC SURGERY Left 2002   "related to Bell's Palsy @ age 1; left eye/side of face droopy; tried to make area symmetrical"  . FEMORAL-POPLITEAL BYPASS GRAFT Right 03/02/2015   Procedure: BYPASS GRAFT FEMORAL-BELOW KNEE POPLITEAL ARTERY;  Surgeon: Chuck Hint, MD;  Location: [facility] OR;  Service: Vascular;  Laterality: Right;  . INGUINAL HERNIA REPAIR Bilateral 2002  . OVARIAN CYST REMOVAL Left   . PERIPHERAL VASCULAR CATHETERIZATION N/A 01/16/2015   Procedure: Abdominal Aortogram;  Surgeon: Chuck Hint, MD;  Location[facility] INVASIVE CV LAB;  Service: Cardiovascular;  Laterality: N/A;  . POSTERIOR LUMBAR FUSION  2015   "have plates and screws in there"  . TONSILLECTOMY      Allergies  Allergen Reactions  . Amoxicillin Other (See Comments)    UTI Has patient had a PCN reaction causing immediate rash, facial/tongue/throat swelling, SOB or lightheadedness with hypotension: No Has patient had a PCN reaction causing severe rash involving mucus membranes or skin necrosis: No Has patient had a PCN  reaction that required hospitalization: No Has patient had a PCN reaction occurring within the last 10 years: Yes--UTI ONLY If all of the above answers are "NO", then may proceed with Cephalosporin use.   . Atenolol Cough  . Crestor [Rosuvastatin Calcium] Other (See Comments)    Muscle aches  . Pravastatin Other (See Comments)    Muscle aches  . Sulfa Antibiotics Nausea Only  . Codeine Rash    Current Outpatient Medications  Medication Sig Dispense Refill  . acetaminophen (TYLENOL) 500 MG tablet Take 1,500 mg by mouth every 6 (six) hours as needed (for pain.).    [name] alendronate (FOSAMAX) 70 MG tablet Take 70 mg by mouth every Monday. Take with a full glass of water on an empty stomach.    [name] aspirin EC 81 MG tablet Take 81 mg by mouth daily.    . Brimonidine Tartrate (LUMIFY) 0.025 % SOLN Place 1 drop into both eyes daily as needed (for dry eyes).    [name] buPROPion (WELLBUTRIN XL) 150 MG 24 hr tablet Take 150 mg by mouth every morning.     . Calcium-Phosphorus-Vitamin D (CALCIUM/D3 ADULT GUMMIES PO) Take 2 tablets by mouth daily at 2 PM.    . DEXILANT 30 MG capsule Take 30 mg by mouth daily before breakfast.     . diltiazem (CARDIZEM) 30 MG tablet Take 30 mg by mouth daily as needed (blood pressure higher than 160).     . dofetilide (TIKOSYN) 250 MCG capsule Take 1 capsule (250 mcg total) by mouth 2 (two) times daily. 60 capsule 6  . ELIQUIS 5 MG TABS tablet TAKE 1 TABLET(5 MG) BY MOUTH TWICE DAILY 60 tablet 9  . ezetimibe (ZETIA) 10 MG tablet Take 10 mg by mouth daily.    . hydrALAZINE (APRESOLINE) 25 MG tablet TAKE 1 TABLET BY MOUTH EVERY 8 HOURS AS NEEDED FOR SYSTOLIC BLOOD PRESSURE(TOP NUMBER)>160 270 tablet 0  . midodrine (PROAMATINE) 10 MG tablet Take 10 mg by mouth 3 (three) times daily with meals as needed (for systolic  blood pressure lower than 100).  90 tablet 1  . Multiple Vitamins-Iron (STRESS B COMPLEX/IRON) TABS Take 1 tablet by mouth at bedtime.     . nebivolol (BYSTOLIC) 2.5  MG tablet Take 1 tablet (2.5 mg total) by mouth at bedtime. 30 tablet 6  . Polyethyl Glycol-Propyl Glycol (SYSTANE) 0.4-0.3 % GEL ophthalmic gel Place 1 application into both eyes daily as needed (for eye irritation/dryness.).    [name] potassium chloride (K-DUR,KLOR-CON) 10 MEQ tablet Take 1 tablet (10 mEq total) by mouth daily. 30 tablet 6  . PROAIR HFA 108 (90 Base) MCG/ACT inhaler Inhale 2 puffs into the lungs 4 (four) times daily as needed (for wheezing/shortness of breath.).   1  . rosuvastatin (CRESTOR) 5 MG tablet Take 2.5 mg by mouth See admin instructions. Take 2.5mg  by mouth 5 days weekly    . zolpidem (AMBIEN) 5 MG tablet Take 5 mg by mouth at bedtime.      No current facility-administered medications for this visit.      REVIEW OF SYSTEMS: See HPI for pertinent positives and negatives.  Physical Examination Vitals:   02/12/18 0951 02/12/18 0955 02/12/18 0957  BP: (!) 191/93 (!) 160/80 (!) 166/93  Pulse: (!) 51 (!) 51 (!) 51  Resp: 14    Temp: (!) 97.2 F (36.2 C)    TempSrc: Oral    SpO2: 94%    Weight: 123 lb (55.8 kg)    Height: 5\' 4"  (1.626 m)     Body mass index is 21.11 kg/m.  General:  A&O x 3, WDWN,female. Gait:normal HENT: No apparent abnormalities  Eyes: PERRLA. Pulmonary: Respirations are non labored, CTAB, good air movement Cardiac:regularrhythmand rate,+murmur.    Carotid Bruits Right Left   Transmitted cardiac murmur Negative   Radial pulses: right is not palpable, left is 2+ palpable. Right brachial pulse is 3+ palpable. Adominal aortic pulse isnotpalpable   VASCULAR EXAM: Extremitieswithoutischemic changes, withoutGangrene; withoutopen wounds.  LE Pulses Right Left  FEMORAL 2+palpable 1+palpable   POPLITEAL 2+palpable  notpalpable  POSTERIOR TIBIAL 2+palpable  notpalpable   DORSALIS PEDIS ANTERIOR TIBIAL 2+palpable  notpalpable    Abdomen:  soft, NT, nopalpablemasses. Skin: no rashes, no ulcers noted. Musculoskeletal: no muscle wasting or atrophy. Neurologic: A&O X 3; appropriate affect, SENSATION: normal; MOTOR FUNCTION: moving all extremities equally, motor strength 5/5 throughout. Speech is fluent/normal. CN 2-12 intact except left side facial droop since Bell's Palsy years ago  Psychiatric: Thought content is normal, mood appropriate for clinical situation.      ASSESSMENT:  Beverly Li is a 72 y.o. female who iss/p right fem to below knee popliteal by pass with composite vein,  2 pieces of right GSV spliced together, on 03-02-15.  She had a very difficult postoperative course because of her hypertrophic obstructive cardiomyopathy. For this reason Dr. Edilia Bo would be very reluctant to consider a bypass in the left leg and the pt agrees.   She no longer has claudication symptoms in either leg with walking.  She states that she cannot have any more general anesthetic as she became very hypotensive and had to be admitted to ICU the last 2 surgeries she had. This is the reason that her blood pressure is allowed to remain slightly elevated.   She has no hx of stroke of TIA, she has a hx of Bell's Palsy at age 72, has left side facial droop for this. Carotid duplex on 02-12-18 demonstrated 1-39% bilateral ICA stenosis.   DATA  Carotid Duplex (02-12-18): 1-39%  bilateral ICA stenosis Bilateral vertebral artery flow is antegrade.  Bilateral subclavian artery waveforms are normal.  No previous exam for comparison.   Right LE Arterial Duplex (02-11-18): Patent right common femoral to below knee popliteal bypass graft with no evidence for restenosis, tri and biphasic waveforms.  Left proximal superficial femoral artery occlusion extending to the distal thigh with visualized collateral feeding on distally.   ABI (Date: 02/12/2018):  R:   ABI: 1.08 (was 1.1 on 05-19-17),   PT: tri  DP: tri  TBI:   0.86, (was 0.73)  L:   ABI: 0.66 (was 0.75),   PT: mono (was mono)  DP: bi (was mono)  TBI: 0.59, toe pressure 119, (was 0.65) Right ABI and TBI remain normal with triphasic waveforms.  Slight decline in left ABI, but improved DP waveform quality.     PLAN:  Based on the patient's vascular studies and examination,pt will return to clinic in 1 yearwith ABI's, and right LE arterial duplex. Will recheck carotid duplex in about 3 years (late 2022).  I discussed in depth with the patient the nature of atherosclerosis, and emphasized the importance of maximal medical management including strict control of blood pressure, blood glucose, and lipid levels, obtaining regular exercise, and cessation of smoking.  The patient is aware that without maximal medical management the underlying atherosclerotic disease process will progress, limiting the benefit of any interventions.  The patient was given information about PAD including signs, symptoms, treatment, what symptoms should prompt the patient to seek immediate medical care, and risk reduction measures to take.  Thank you for allowing [place] to participate in this patient's care.  Charisse March, RN, MSN, FNP-C [facility] Office: [phone]  Clinic MD: Edilia Bo in vein clinic, Chestine Spore on call 02/12/2018 10:09 AM

## 2018-02-12 NOTE — Patient Instructions (Signed)

## 2018-05-15 ENCOUNTER — Other Ambulatory Visit: Payer: Self-pay

## 2018-05-15 MED ORDER — ROSUVASTATIN CALCIUM 5 MG PO TABS: 2.5000 mg | ORAL_TABLET | Freq: Every day | ORAL | 3 refills | Status: DC

## 2018-05-15 NOTE — Telephone Encounter (Signed)
Pt called in and is requesting a refill on Rosuvastatin 5mg . Pt states she has been taking them daily instead of 5 days per week. Please address. Thank you.

## 2018-05-15 NOTE — Telephone Encounter (Signed)
I spoke to the patient who said that she is tolerating Rosuvastatin 2.5 mg daily.  I told her that I would refill her prescription.

## 2018-05-16 ENCOUNTER — Other Ambulatory Visit: Payer: Self-pay | Admitting: Cardiology

## 2018-05-17 ENCOUNTER — Other Ambulatory Visit: Payer: Self-pay | Admitting: Internal Medicine

## 2018-05-18 NOTE — Telephone Encounter (Signed)
Agree with plan Mark Skains, MD  

## 2018-05-18 NOTE — Telephone Encounter (Signed)
Pt last saw Dr Graciela Husbands 01/06/18, last labs 11/26/17 Creat 0.87, age 73, weight 55.8kg, based on specified criteria pt is on appropriate dosage of Eliquis 5mg  BID.  Will refill rx.

## 2018-06-18 ENCOUNTER — Ambulatory Visit: Payer: Medicare Other | Admitting: Cardiology

## 2018-06-18 ENCOUNTER — Encounter: Payer: Self-pay | Admitting: Cardiology

## 2018-06-18 VITALS — BP 180/100 | HR 62 | Ht 64.0 in | Wt 126.4 lb

## 2018-06-18 DIAGNOSIS — I421 Obstructive hypertrophic cardiomyopathy: Secondary | ICD-10-CM | POA: Diagnosis not present

## 2018-06-18 DIAGNOSIS — I484 Atypical atrial flutter: Secondary | ICD-10-CM | POA: Diagnosis not present

## 2018-06-18 DIAGNOSIS — G909 Disorder of the autonomic nervous system, unspecified: Secondary | ICD-10-CM

## 2018-06-18 NOTE — Progress Notes (Signed)
Cardiology Office Note   Date:  06/18/2018   ID:  Beverly Li, DOB 1945/08/13, MRN [id number]  PCP:  Clayborn Heron, MD  Cardiologist:   Donato Schultz, MD / Graciela Husbands       History of Present Illness: Beverly Li is a 73 y.o. female  with hypertrophic obstructive cardiomyopathy, recent severe exacerbation again post cardiac catheterization and prior during femoropopliteal bypass resulting in  phenylephrine , fluids and prolonged intubation here for follow-up.  She underwent diagnostic heart catheterization on 05/07/16 and post procedure developed severe hypotension, hyper vagal response. Requiring once again phenylephrine, facemask oxygen. She did not require intubation. 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 Hydralazine was given post catheterization wound systolic pressure pressure was 190. Profound hypotension and bradycardia noted. This was treated rapidly, fluids, phenylephrine, heart rates as low as the 40s.  Midodrine started. Phenylephrine eventually weaned off. Diuresis noted. Klonopin was started for anxiety, to hopefully help blunt the hyper adrenergic response. She denies being atypically anxious person however.  Echocardiogram 04/2016 showed EF 65-70% with hypertrophic obstructive cardiomyopathy with LVOT gradient of 23 mm at rest.  She is here for follow-up today and should have appointment set up with Dr. Graciela Husbands to discuss her profound autonomic dysfunction.  In 2014 started having "heart burn" episodes. Debilitating. Out walking dog. By time she would get back, sweat, diahpesis, up neck, nausea then diarrhea. Wiped out for a few hours. Does not feel it now with exercise. Felt 3 hours irreg hb afib kicked in. Afaid to go anywhere. The sweat comes over her hot. Pain. After she described this once again, this is why I proceeded with cardiac catheterization to make sure that with her peripheral vascular disease that she did not have severe  coronary artery disease that was now becoming progressive. Thankfully, she did not have flow limiting disease. Moderate LAD, FFR.  Shortly after cardiac catheterization when her blood pressure increased to the 190 systolic range, she was given hydralazine to reduce her blood pressure and she ended up having a hyper vagal response where her heart rate decreases the 40s, blood pressure plummeted into the 50 systolic, she developed flash pulmonary edema. She was placed in the CCU, phenylephrine. Slowly, this was able to be weaned. Klonopin and Midodrin was started.  This episode post catheterization was very similar to her episode intraoperatively during her femoropopliteal bypass. She was intubated for several days after that procedure and with her arterial line demonstrated classic spike and dome changes intermittently with marked variations in her blood pressure at various points.  09/16/16-since her last visit she has had cardiac MRI and stress echocardiogram. Both were reassuring. She did not drop her blood pressures during exercise. She did not increase her gradient significantly during exercise. We were able to invoke an increase left ventricular output tract gradient with Valsalva maneuver. Please see report. No ventricular arrhythmias. She states that over the last 3 months she's felt the best that she's felt in quite some time. She is drinking sports drinks quite frequently, propel. When she does have low blood pressure usually has a prodrome, very vagal-like response as noted previously.  03/31/17-she had a fall in [place] when walking on cobblestones in a garden with flip-flops during a house store with her girlfriends.  She states that she broke her fall with her lips.  She also broke her arm.  Dr. Cleophas Dunker.  She has not had any severe cardiac symptoms thankfully.  At  one point during the last several months she did have low blood pressure and she took the Midrin which took effect after  approximately 3 hours.  She has been drinking propel.  Doing very well.  06/26/17 - 2 episodes with crashed BP, out for the day. Midodrine then. 185/110 at home.  She took a hydralazine at home and now her blood pressure is 158/90.  We once again went over how challenging it is for her given her hypertrophic cardiomyopathy and previous severe hypotension.  Currently she is not have any chest pain, no strokelike symptoms.  No fevers chills nausea vomiting.  10/28/2017- cardioversion took place in June 2019 setting of 2-1 atrial flutter with rapid ventricular response for approximately 4 days causing symptoms.  Dr. Shirlee LatchMcLean performed cardioversion with Dr. Jean RosenthalJackson as anesthesiologist.  Dr. Graciela HusbandsKlein saw her in clinic prior to conversion.  Overall she has been doing quite well.  She did have one other episode at the beach lasting approximately 2 or 3 hours at a heart rate that she says was 125 to 130 bpm.  No syncope, no bleeding, no orthopnea, no PND.  Past Medical History:  Diagnosis Date  . Anxiety   . Arthritis    "some in my lower back; probably elbows, knees" (11/18/2017)  . Atrial fibrillation (HCC)   . Bell's palsy    when pt. was 73 yrs old, when under stress the left side of face will droop.  . Complication of anesthesia    "vascular OR 2016; BP bottomed out; couldn't get it regulated; ended up in ICU for DAYS" (11/18/2017)  . GERD (gastroesophageal reflux disease)   . Heart murmur   . History of kidney stones   . Hypertension   . Hypertrophic cardiomyopathy (HCC)    severe LV basilar hypertrophy witn no evidence of significant outflow tract obstruction, EF 65-70%, mild LAE, mild TR, grade 1a diastolic dysfunction 05/15/10 (Dr. Donato SchultzMark ) (Atrial Septal Hypertrophy pattern)-- Intra-op TEE with dsignificant outflow tract obstruction - AI, MR & TR  . Insomnia   . Mild aortic sclerosis (HCC)   . Osteopenia   . Peripheral vascular disease (HCC)   . Syncope    , Vagal    Past Surgical History:    Procedure Laterality Date  . AUGMENTATION MAMMAPLASTY Bilateral   . BACK SURGERY    . CARDIAC CATHETERIZATION N/A 05/07/2016   Procedure: Left Heart Cath and Coronary Angiography;  Surgeon: Kathleene Hazelhristopher D McAlhany, MD;  [facility];  Service: Cardiovascular;  Laterality: N/A;  . CARDIOVERSION N/A 09/24/2017   Procedure: CARDIOVERSION;  Surgeon: Laurey MoraleMcLean, Dalton S, MD;  [facility];  Service: Cardiovascular;  Laterality: N/A;  . DILATION AND CURETTAGE OF UTERUS    . ENDARTERECTOMY FEMORAL Right 03/02/2015   Procedure: ENDARTERECTOMY RIGHT FEMORAL;  Surgeon: Chuck Hinthristopher S Dickson, MD;  [facility];  Service: Vascular;  Laterality: Right;  . FACIAL COSMETIC SURGERY Left 2002   "related to Bell's Palsy @ age 73; left eye/side of face droopy; tried to make area symmetrical"  . FEMORAL-POPLITEAL BYPASS GRAFT Right 03/02/2015   Procedure: BYPASS GRAFT FEMORAL-BELOW KNEE POPLITEAL ARTERY;  Surgeon: Chuck Hinthristopher S Dickson, MD;  [facility];  Service: Vascular;  Laterality: Right;  . INGUINAL HERNIA REPAIR Bilateral 2002  . OVARIAN CYST REMOVAL Left   . PERIPHERAL VASCULAR CATHETERIZATION N/A 01/16/2015   Procedure: Abdominal Aortogram;  Surgeon: Chuck Hinthristopher S Dickson, MD;  [facility];  Service: Cardiovascular;  Laterality: N/A;  . POSTERIOR  LUMBAR FUSION  2015   "have plates and screws in there"  . TONSILLECTOMY       Current Outpatient Medications  Medication Sig Dispense Refill  . acetaminophen (TYLENOL) 500 MG tablet Take 1,500 mg by mouth every 6 (six) hours as needed (for pain.).    [name] alendronate (FOSAMAX) 70 MG tablet Take 70 mg by mouth every Monday. Take with a full glass of water on an empty stomach.    [name] aspirin EC 81 MG tablet Take 81 mg by mouth daily.    . Brimonidine Tartrate (LUMIFY) 0.025 % SOLN Place 1 drop into both eyes daily as needed (for dry eyes).    [name] buPROPion (WELLBUTRIN XL) 150 MG 24 hr tablet Take 150 mg by mouth every  morning.     . Calcium-Phosphorus-Vitamin D (CALCIUM/D3 ADULT GUMMIES PO) Take 2 tablets by mouth daily at 2 PM.    . DEXILANT 30 MG capsule Take 30 mg by mouth daily before breakfast.     . diltiazem (CARDIZEM) 30 MG tablet Take 30 mg by mouth daily as needed (blood pressure higher than 160).     . dofetilide (TIKOSYN) 250 MCG capsule Take 1 capsule (250 mcg total) by mouth 2 (two) times daily. 60 capsule 6  . ELIQUIS 5 MG TABS tablet TAKE 1 TABLET(5 MG) BY MOUTH TWICE DAILY 60 tablet 6  . ezetimibe (ZETIA) 10 MG tablet Take 10 mg by mouth daily.    . hydrALAZINE (APRESOLINE) 25 MG tablet TAKE 1 TABLET BY MOUTH EVERY 8 HOURS AS NEEDED FOR SYSTOLIC BLOOD PRESSURE(TOP NUMBER)>160 90 tablet 3  . midodrine (PROAMATINE) 10 MG tablet Take 10 mg by mouth 3 (three) times daily with meals as needed (for systolic blood pressure lower than 100).  90 tablet 1  . Multiple Vitamins-Iron (STRESS B COMPLEX/IRON) TABS Take 1 tablet by mouth at bedtime.     . nebivolol (BYSTOLIC) 2.5 MG tablet Take 1 tablet (2.5 mg total) by mouth at bedtime. 30 tablet 6  . Polyethyl Glycol-Propyl Glycol (SYSTANE) 0.4-0.3 % GEL ophthalmic gel Place 1 application into both eyes daily as needed (for eye irritation/dryness.).    [name] potassium chloride (K-DUR,KLOR-CON) 10 MEQ tablet Take 1 tablet (10 mEq total) by mouth daily. 30 tablet 6  . PROAIR HFA 108 (90 Base) MCG/ACT inhaler Inhale 2 puffs into the lungs 4 (four) times daily as needed (for wheezing/shortness of breath.).   1  . rosuvastatin (CRESTOR) 5 MG tablet Take 0.5 tablets (2.5 mg total) by mouth daily. (Patient taking differently: Take 5 mg by mouth daily. ) 90 tablet 3  . zolpidem (AMBIEN) 5 MG tablet Take 5 mg by mouth at bedtime.      No current facility-administered medications for this visit.     Allergies:   Amoxicillin; Atenolol; Crestor [rosuvastatin calcium]; Pravastatin; Sulfa antibiotics; and Codeine    Social History:  The patient  reports that she quit  smoking about 3 years ago. Her smoking use included cigarettes. She has a 50.00 pack-year smoking history. She has never used smokeless tobacco. She reports current alcohol use. She reports that she does not use drugs.   Family History:  The patient's family history includes Breast cancer in her sister; Cancer in her father, mother, and sister; Hypertension in her mother; Liver cancer in her mother; Lung cancer in her father.    ROS:  Please see the history of present illness.  All others negative.   PHYSICAL EXAM: VS:  BP (!) 180/100  Pulse 62   Ht 5\' 4"  (1.626 m)   Wt 126 lb 6.4 oz (57.3 kg)   LMP  (LMP Unknown)   SpO2 96%   BMI 21.70 kg/m  , BMI Body mass index is 21.7 kg/m. GEN: Well nourished, well developed, in no acute distress  HEENT: normal  Neck: no JVD, carotid bruits, or masses Cardiac: RRR; 2/6 SM  rubs, or gallops,no edema  Respiratory:  clear to auscultation bilaterally, normal work of breathing GI: soft, nontender, nondistended, + BS MS: no deformity or atrophy  Skin: warm and dry, no rash Neuro:  Alert and Oriented x 3, Strength and sensation are intact Psych: euthymic mood, full affect   EKG: 01/06/2018-sinus bradycardia 53/04/22/16-sinus bradycardia rate 58 with biatrial enlargement, T-wave inversion noted in 1, aVL, subtle downsloping noted in V6. Note hypertrophic cardiomyopathy. Prior, sinus brady with T-wave changes same as current. Personally viewed.   Recent Labs: 09/26/2017: Hemoglobin 14.2; Platelets 243 11/26/2017: BUN 26; Creatinine, Ser 0.87; Magnesium 2.3; Potassium 4.6; Sodium 141     Lipid Panel    Component Value Date/Time   CHOL 211 (H) 09/16/2016 1200   TRIG 78 09/16/2016 1200   HDL 97 09/16/2016 1200   CHOLHDL 2.2 09/16/2016 1200   LDLCALC 98 09/16/2016 1200  LDL last checked 03/19/17 108    Wt Readings from Last 3 Encounters:  06/18/18 126 lb 6.4 oz (57.3 kg)  02/12/18 123 lb (55.8 kg)  01/06/18 123 lb 6.4 oz (56 kg)      Other  studies Reviewed: Additional studies/ records that were reviewed today include:  Hospital records reviewed, echocardiogram reviewed. Review of the above records demonstrates:  As above   ASSESSMENT AND PLAN:  73 year old female with hypertrophic obstructive cardiomyopathy, moderate coronary artery disease, nonflow limiting LAD lesion by FFR, peripheral vascular disease requiring femoropopliteal bypass, long-standing history of hyper vagal responses.    Hypertrophic obstructive cardiomyopathy/Dyspnea on exertion  -  Avoid dehydration  -  At baseline, outflow tract is not significant  -  Intraoperative. With crossclamping, vasodilatation,   Increased adrenergic drive ,Perfect storm to elucidate obstruction.She was intubated for several days postoperatively femoropopliteal and hemodynamically post significant troubles trying to extubate given her periodic outflow tract obstruction which was severe.  -  We will try to avoid all elective surgical procedures in the future.  -  No high-risk symptoms such as ventricular tachycardia, early cardiac death in family members , unexplained syncope , septum less than 3 cm. Murmur has been present at baseline.   -   Bystolic 2.5 QD (decreased heart rate , decreased inotropic beneficial in her situation) given her blood pressure that was highly elevated.  When she has tried higher dosing, she bottoms out too quickly and this causes near syncope.   - She describes hypervagal-like experiences including nausea, vomiting, pallor, diaphoresis with subsequent diarrhea, feeling wiped out for hours. She has been battling this for years. Continue to avoid dehydration. She is drinking propel. She knows to lay down immediately if she begins to feel this.  - Now on midodrine PRN.  Sometimes she needs to take 2 of them to help.  - Thankfully the stress echocardiogram did not show any drops in blood pressure with exercise. She did not show any increased gradient during exercise  on echocardiogram. She did however when performing Valsalva increase her gradient. Please see report. No ventricular arrhythmias.  - MRI did not show any evidence of SAM, minimal gradient.  -Blood pressure remains difficult to control.  I am willing to tolerate elevated blood pressures because her low blood pressure values can be severely reduced, 60/40 for instance.   -Dyspnea-Prior extensive smoking history- PFTs/pulmonary evaluation, Referred to pulmonary previously. Chest x-ray PA and lateral from 09/19/12 was interpreted as underlying COPD.  Continue exercise.   Sinus bradycardia   - She takes hydralazine if her blood pressure gets over 160 occasionally. She takes midodrine if it drops significantly but she has not taken this recently.  Currently her heart rate is 62 bpm on low-dose Bystolic.    Paroxysmal atrial fibrillation  -Had episode requiring cardioversion in June 2019.  Felt sluggish, weak, short of breath.  Successful cardioversion on propofol.  She did not have any hemodynamic instability.  -  Continuing with anticoagulation, Eliquis  -  No longer on amiodarone.  Now on dofetilide.  -  Postoperative,  seems to be maintaining sinus rhythm well.  Anxiety  - would be ok to use low dose Xanax if needed. Dr. Barbaraann Barthel.  This was previously discussed with Dr. Gala Romney.    Statin intolerance  -  Did not tolerate Crestor well in the past but now she is taking a full 5 mg of Crestor and tolerating this..   -  Did not tolerate pravastatin 20 mg once a day either. Her legs bother her significant only.  - Appreciate help with lipid clinic for further discussion, Zetia may be option.  Times will have aching shoulders  Essential hypertension, labile hypertension -Bystolic 2.5, Cardizem 30 mg as needed for significant hypertension.. Challenging.  Understands stroke risks however understands risks of cardiovascular collapse given her swings.  Disposition:   FU with  in 6 months.    Signed, Donato Schultz, MD  06/18/2018 3:38 PM    [facility] [address] [place], [place], [place]  [address] Phone: [phone]; Fax: [phone]

## 2018-06-18 NOTE — Patient Instructions (Signed)
Medication Instructions:  The current medical regimen is effective;  continue present plan and medications.  If you need a refill on your cardiac medications before your next appointment, please call your pharmacy.   Follow-Up: At [facility], you and your health needs are our priority.  As part of our continuing mission to provide you with exceptional heart care, we have created designated Provider Care Teams.  These Care Teams include your primary Cardiologist (physician) and Advanced Practice Providers (APPs -  Physician Assistants and Nurse Practitioners) who all work together to provide you with the care you need, when you need it. You will need a follow up appointment in 6 months.  Please call our office 2 months in advance to schedule this appointment.  You may see No primary care provider on file. or one of the following Advanced Practice Providers on your designated Care Team:   Norma Fredrickson, NP Nada Boozer, NP . Georgie Chard, NP  Thank you for choosing [facility]!!

## 2018-07-04 ENCOUNTER — Other Ambulatory Visit: Payer: Self-pay | Admitting: Physician Assistant

## 2018-07-05 ENCOUNTER — Other Ambulatory Visit: Payer: Self-pay | Admitting: Cardiology

## 2018-07-15 ENCOUNTER — Other Ambulatory Visit: Payer: Self-pay | Admitting: Physician Assistant

## 2018-08-24 ENCOUNTER — Telehealth: Payer: Self-pay | Admitting: Cardiology

## 2018-08-24 NOTE — Telephone Encounter (Signed)
Dr. Anne Fu, Your recent office note mentioned avoiding all elective procedures. Can you comment on clearance for lumbar surgery? If you think she can undergo this surgery, please comment on ASA.   Thanks Angie

## 2018-08-24 NOTE — Telephone Encounter (Signed)
   [facility] Pre-operative Risk Assessment    Request for surgical clearance:  1. What type of surgery is being performed?  Lumbar Surgery   2. When is this surgery scheduled?  TBD   3. What type of clearance is required (medical clearance vs. Pharmacy clearance to hold med vs. Both)?  Both  4. Are there any medications that need to be held prior to surgery and how long? Eliquis and ASA   5. Practice name and name of physician performing surgery?  [facility]- Dr. Cydney Ok   6. What is your office phone number? [phone] Ext [phone]    7.   What is your office fax number? [phone]  8.   Anesthesia type (None, local, MAC, general) ?  General    _________________________________________________________________   (provider comments below)

## 2018-08-25 NOTE — Telephone Encounter (Signed)
Pt takes Eliquis for afib with CHADS2VASc score of 5 (age, sex, CHF, HTN, CAD). Renal function is normal. Ok to hold Eliquis for 3 days prior to spinal procedure if Dr Anne Fu clears pt from cardiac standpoint.

## 2018-08-26 NOTE — Telephone Encounter (Signed)
I would avoid surgery/ general anesthesia because of extremely high risks unless surgery is absolutely life or death. She was extremely close to death surrounding her last surgical experience secondary to hemodynamic collapse given her hypertrophic obstructive cardiomyopathy symptoms.  Donato Schultz, MD

## 2018-08-27 NOTE — Telephone Encounter (Signed)
I am routing all below conversation to Dr. Jule Ser at [facility].

## 2018-08-27 NOTE — Telephone Encounter (Signed)
I called Beverly Li to inform her of Dr. Anne Fu' recommendations for avoiding surgery/general anesthesia unless absolutely life or death. She does not like this as she says that she cannot live with this pain. I advised her to discuss other options for treatment with Dr. Newell Coral. She says she has an appointment this afternoon.  She also wishes Dr. Anne Fu' input on what options would be acceptable aside from general anesthesia (ie epidural). I again encouraged her to discuss with Dr. Jule Ser and he can place another request for clearance if a less invasive option is considered.

## 2018-08-28 ENCOUNTER — Other Ambulatory Visit: Payer: Self-pay | Admitting: Cardiology

## 2018-09-01 ENCOUNTER — Other Ambulatory Visit (HOSPITAL_COMMUNITY)
Admission: RE | Admit: 2018-09-01 | Discharge: 2018-09-01 | Disposition: A | Discharge status: Home / Self Care | Payer: Medicare Other | Source: Other Acute Inpatient Hospital | Attending: Cardiology | Admitting: Cardiology

## 2018-09-01 DIAGNOSIS — M4644 Discitis, unspecified, thoracic region: Secondary | ICD-10-CM | POA: Diagnosis present

## 2018-09-01 LAB — CBC WITH DIFFERENTIAL/PLATELET
Abs Immature Granulocytes: 0.06 10*3/uL (ref 0.00–0.07)
Basophils Absolute: 0 10*3/uL (ref 0.0–0.1)
Basophils Relative: 0 %
Eosinophils Absolute: 0.1 10*3/uL (ref 0.0–0.5)
Eosinophils Relative: 1 %
HCT: 51.8 % — ABNORMAL HIGH (ref 36.0–46.0)
Hemoglobin: 16.4 g/dL — ABNORMAL HIGH (ref 12.0–15.0)
Immature Granulocytes: 1 %
Lymphocytes Relative: 22 %
Lymphs Abs: 2.9 10*3/uL (ref 0.7–4.0)
MCH: 28.6 pg (ref 26.0–34.0)
MCHC: 31.7 g/dL (ref 30.0–36.0)
MCV: 90.2 fL (ref 80.0–100.0)
Monocytes Absolute: 0.7 10*3/uL (ref 0.1–1.0)
Monocytes Relative: 5 %
Neutro Abs: 9.4 10*3/uL — ABNORMAL HIGH (ref 1.7–7.7)
Neutrophils Relative %: 71 %
Platelets: 479 10*3/uL — ABNORMAL HIGH (ref 150–400)
RBC: 5.74 MIL/uL — ABNORMAL HIGH (ref 3.87–5.11)
RDW: 14.8 % (ref 11.5–15.5)
WBC: 13.1 10*3/uL — ABNORMAL HIGH (ref 4.0–10.5)
nRBC: 0 % (ref 0.0–0.2)

## 2018-09-01 LAB — C-REACTIVE PROTEIN: CRP: 2.5 mg/dL — ABNORMAL HIGH (ref <1.0)

## 2018-09-01 LAB — SEDIMENTATION RATE: Sed Rate: 30 mm/hr — ABNORMAL HIGH (ref 0–22)

## 2018-09-02 ENCOUNTER — Telehealth: Payer: Self-pay | Admitting: *Deleted

## 2018-09-02 ENCOUNTER — Other Ambulatory Visit: Payer: Self-pay | Admitting: Internal Medicine

## 2018-09-02 DIAGNOSIS — M4644 Discitis, unspecified, thoracic region: Secondary | ICD-10-CM

## 2018-09-02 NOTE — Telephone Encounter (Signed)
-----   Message from Cliffton Asters, MD sent at 09/02/2018  8:55 AM EDT ----- Please call Ms. Mcginnis and schedule her to see me in clinic tomorrow, 09/03/2018, at 11:30 AM.  She has thoracic discitis and will need blood work, PICC placement and outpatient antibiotics arranged.  If you have any questions today just simply call me on my cell.  Thanks.John [phone]

## 2018-09-02 NOTE — Telephone Encounter (Signed)
RN left message at patient's home and mobile numbers, asking her to call back and confirm appointment 5/21 at 11:15. Will need to give her address information. RN left message with Beverly Li in [facility] letting her know of the PICC placement order and asking her to call back with available appointments. Andree Coss, RN

## 2018-09-02 NOTE — Telephone Encounter (Signed)
Confirmed appointment time and location with patient.  Will follow for PICC placement.

## 2018-09-03 ENCOUNTER — Other Ambulatory Visit: Payer: Self-pay | Admitting: Internal Medicine

## 2018-09-03 ENCOUNTER — Ambulatory Visit: Payer: Medicare Other | Admitting: Internal Medicine

## 2018-09-03 ENCOUNTER — Telehealth: Payer: Self-pay | Admitting: *Deleted

## 2018-09-03 ENCOUNTER — Ambulatory Visit (HOSPITAL_COMMUNITY)
Admission: RE | Admit: 2018-09-03 | Discharge: 2018-09-03 | Disposition: A | Discharge status: Home / Self Care | Payer: Medicare Other | Source: Ambulatory Visit | Attending: Internal Medicine | Admitting: Internal Medicine

## 2018-09-03 ENCOUNTER — Other Ambulatory Visit: Payer: Self-pay

## 2018-09-03 ENCOUNTER — Telehealth (HOSPITAL_COMMUNITY): Payer: Self-pay

## 2018-09-03 ENCOUNTER — Encounter: Payer: Self-pay | Admitting: Internal Medicine

## 2018-09-03 DIAGNOSIS — M4644 Discitis, unspecified, thoracic region: Secondary | ICD-10-CM

## 2018-09-03 MED ORDER — LIDOCAINE HCL 1 % IJ SOLN
INTRAMUSCULAR | Status: AC | PRN
  Administered 2018-09-03: 5 mL

## 2018-09-03 MED ORDER — VANCOMYCIN HCL IN DEXTROSE 1-5 GM/200ML-% IV SOLN: 1000.0000 mg | INTRAVENOUS | Status: DC

## 2018-09-03 MED ORDER — LIDOCAINE HCL 1 % IJ SOLN
INTRAMUSCULAR | Status: AC
  Filled 2018-09-03: qty 20

## 2018-09-03 MED ORDER — CYCLOBENZAPRINE HCL 10 MG PO TABS: 5.0000 mg | ORAL_TABLET | Freq: Three times a day (TID) | ORAL | 2 refills | Status: DC | PRN

## 2018-09-03 MED ORDER — CEFTRIAXONE SODIUM 1 G IJ SOLR: 2.0000 g | Freq: Once | INTRAMUSCULAR | 0 refills | Status: AC

## 2018-09-03 NOTE — Progress Notes (Signed)
[facility]  Reason for Consult: Thoracic discitis Referring Provider: Dr. Jovita Gamma  Assessment: She has subacute thoracic discitis and osteomyelitis.  Normally I would ask IR to do an aspirate of the T7-8 disc and send the specimen for Gram stain and culture.  However she has hypertrophic obstructive cardiomyopathy and had severe cardiac decompensation after vascular surgery in 2016 and after a cardiac cath in 2018.  I have spoken with her cardiologist, Dr. Candee Furbish, and he advises against conscious sedation and an aspirate.  I am in agreement with that.  She had blood cultures obtained yesterday.  I talked to her about management options and recommended empiric IV vancomycin and ceftriaxone.  She is in agreement with that plan.  Plan: 1. PICC placement 2. Start IV vancomycin and ceftriaxone 3. Follow-up in 4 weeks  Diagnosis: Thoracic discitis  Culture Result: NA  Allergies  Allergen Reactions   Amoxicillin Other (See Comments)    UTI Has patient had a PCN reaction causing immediate rash, facial/tongue/throat swelling, SOB or lightheadedness with hypotension: No Has patient had a PCN reaction causing severe rash involving mucus membranes or skin necrosis: No Has patient had a PCN reaction that required hospitalization: No Has patient had a PCN reaction occurring within the last 10 years: Yes--UTI ONLY If all of the above answers are "NO", then may proceed with Cephalosporin use.    Atenolol Cough   Crestor [Rosuvastatin Calcium] Other (See Comments)    Muscle aches   Pravastatin Other (See Comments)    Muscle aches   Sulfa Antibiotics Nausea Only   Codeine     hallucinations    OPAT Orders Discharge antibiotics: Per pharmacy protocol vancomycin and ceftriaxone Aim for Vancomycin trough 15-20 (unless otherwise indicated) Duration: 6 weeks End Date: 10/16/2018  [facility] Care Per Protocol:  Labs weekly while on IV  antibiotics: _x_ CBC with differential _x_ BMP __ CMP _x_ CRP _x_ ESR _x_ Vancomycin trough __ CK  __ Please pull PIC at completion of IV antibiotics _x_ Please leave PIC in place until doctor has seen patient or been notified  Fax weekly labs to [phone]  Clinic Follow Up Appt: 1 month   Patient Active Problem List   Diagnosis Date Noted   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    Priority: High   Visit for monitoring Tikosyn therapy 11/18/2017   Closed nondisplaced fracture of head of radius with routine healing 03/03/2017   Coronary artery disease involving native coronary artery of native heart without angina pectoris    Chronic diastolic heart failure ([place]) 03/28/2015   Paroxysmal atrial fibrillation with rapid ventricular response (HCC)    Essential hypertension    S/P femoral-popliteal bypass surgery    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[place]) 03/03/2015   PAD (peripheral artery disease) ([place]) 03/02/2015   Claudication ([place]) 01/31/2015   Tobacco abuse, 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   VANCOMYCIN (VANCOCIN) 1-5 GM/200ML-% SOLN    Inject 200 mLs (1,000 mg total) into the vein daily.  Previous Medications   ACETAMINOPHEN (TYLENOL) 500 MG TABLET    Take 1,500 mg  by mouth every 6 (six) hours as needed (for pain.).   ALENDRONATE (FOSAMAX) 70 MG TABLET    Take 70 mg by mouth every Monday. Take with a full glass of water on an empty stomach.   ASPIRIN EC 81 MG TABLET    Take 81 mg by mouth daily.   BUPROPION (WELLBUTRIN XL) 150 MG 24 HR TABLET    Take 150 mg by mouth every morning.     BYSTOLIC 2.5 MG TABLET    TAKE 1 TABLET(2.5 MG) BY MOUTH AT BEDTIME   CALCIUM-PHOSPHORUS-VITAMIN D (CALCIUM/D3 ADULT GUMMIES PO)    Take 2 tablets by mouth daily at 2 PM.   DEXILANT 30 MG CAPSULE    Take 30 mg by mouth daily before breakfast.    DILTIAZEM (CARDIZEM) 30 MG TABLET    Take 30 mg by mouth daily as needed (blood pressure higher than 160).    DOFETILIDE (TIKOSYN) 250 MCG CAPSULE    TAKE 1 CAPSULE(250 MCG) BY MOUTH TWICE DAILY   ELIQUIS 5 MG TABS TABLET    TAKE 1 TABLET(5 MG) BY MOUTH TWICE DAILY   EZETIMIBE (ZETIA) 10 MG TABLET    TAKE 1 TABLET(10 MG) BY MOUTH DAILY   HYDRALAZINE (APRESOLINE) 25 MG TABLET    TAKE 1 TABLET BY MOUTH EVERY 8 HOURS AS NEEDED FOR SYSTOLIC BLOOD PRESSURE(TOP NUMBER)>160   HYDROCODONE-ACETAMINOPHEN (NORCO/VICODIN) 5-325 MG TABLET    TAKE 1 TO 2 TABLETS EVERY 6 HOURS AS NEEDED FOR SEVERE BACK PAIN. DO NOT TAKE WITH TYLENOL   MIDODRINE (PROAMATINE) 10 MG TABLET    Take 10 mg by mouth 3 (three) times daily with meals as needed (for systolic blood pressure lower than 100).    MULTIPLE VITAMINS-IRON (STRESS B COMPLEX/IRON) TABS    Take 1 tablet by mouth at bedtime.    POTASSIUM CHLORIDE (K-DUR,KLOR-CON) 10 MEQ TABLET    TAKE 1 TABLET(10 MEQ) BY MOUTH DAILY   PROAIR HFA 108 (90 BASE) MCG/ACT INHALER    Inhale 2 puffs into the lungs 4 (four) times daily as needed (for wheezing/shortness of breath.).    ROSUVASTATIN (CRESTOR) 5 MG TABLET    Take 0.5 tablets (2.5 mg total) by mouth daily.   ZOLPIDEM (AMBIEN) 5 MG TABLET    Take 5 mg by mouth at bedtime.   Modified Medications   Modified Medication Previous Medication   CYCLOBENZAPRINE (FLEXERIL) 10 MG TABLET cyclobenzaprine (FLEXERIL) 10 MG tablet      Take 0.5 tablets (5 mg total) by mouth 3 (three) times daily as needed for muscle spasms.    TK 1 T PO Q 8 H PRF MUSCLE SPASM  Discontinued Medications   BRIMONIDINE TARTRATE (LUMIFY) 0.025 % SOLN    Place 1 drop into both eyes daily as needed (for dry eyes).    POLYETHYL GLYCOL-PROPYL GLYCOL (SYSTANE) 0.4-0.3 % GEL OPHTHALMIC GEL    Place 1 application into both eyes daily as needed (for eye irritation/dryness.).    HPI: Beverly Li is a 73 y.o. female who had fairly sudden onset of mid back pain 7 to 8 weeks ago.  She did not recall any specific injury prior to onset of pain.  The pain has gotten progressively worse.  She describes it as the worst pain of her life.  It radiates around to her chest and down to her lower back.  She says that sometimes it feels like electric shocks.  He has not had any fever, chills or sweats.  She recently saw Dr.  Nudelman who was concerned about vertebral compression fractures.  She had an MRI 2 days ago which showed an old compression fracture of L1 and new evidence of T7-8 discitis and osteomyelitis.  Review of Systems: Review of Systems  Constitutional: Positive for malaise/fatigue. Negative for chills, diaphoresis, fever and weight loss.  Respiratory: Negative for cough and shortness of breath.   Cardiovascular: Negative for chest pain.  Gastrointestinal: Negative for abdominal pain, diarrhea, nausea and vomiting.  Genitourinary: Negative for dysuria.  Musculoskeletal: Positive for back pain.  Neurological: Negative for headaches.      Past Medical History:  Diagnosis Date   Anxiety    Arthritis    "some in my lower back; probably elbows, knees" (11/18/2017)   Atrial fibrillation (HCC)    Bell's palsy    when pt. was 73 yrs old, when under stress the left side of face will droop.   Complication of anesthesia    "vascular OR 2016; BP bottomed out; couldn't get it regulated; ended up in ICU for DAYS" (11/18/2017)   GERD (gastroesophageal reflux disease)    Heart murmur    History of kidney stones    Hypertension    Hypertrophic cardiomyopathy (HCC)    severe LV basilar hypertrophy witn no evidence of significant outflow tract obstruction, EF 65-70%, mild LAE, mild TR, grade 1a diastolic  dysfunction 1/61/09 (Dr. Candee Furbish) (Atrial Septal Hypertrophy pattern)-- Intra-op TEE with dsignificant outflow tract obstruction - AI, MR & TR   Insomnia    Mild aortic sclerosis (HCC)    Osteopenia    Peripheral vascular disease (HCC)    Syncope    , Vagal    Social History   Tobacco Use   Smoking status: Former Smoker    Packs/day: 1.00    Years: 50.00    Pack years: 50.00    Types: Cigarettes    Last attempt to quit: 12/15/2014    Years since quitting: 3.7   Smokeless tobacco: Never Used  Substance Use Topics   Alcohol use: Yes    Comment: 11/18/2017 "might have a couple glasses of wine/month; if that"   Drug use: Never    Family History  Problem Relation Age of Onset   Liver cancer Mother    Cancer Mother        Liver   Hypertension Mother    Lung cancer Father    Cancer Father        Lung   Breast cancer Sister    Cancer Sister        Breast   Allergies  Allergen Reactions   Amoxicillin Other (See Comments)    UTI Has patient had a PCN reaction causing immediate rash, facial/tongue/throat swelling, SOB or lightheadedness with hypotension: No Has patient had a PCN reaction causing severe rash involving mucus membranes or skin necrosis: No Has patient had a PCN reaction that required hospitalization: No Has patient had a PCN reaction occurring within the last 10 years: Yes--UTI ONLY If all of the above answers are "NO", then may proceed with Cephalosporin use.    Atenolol Cough   Crestor [Rosuvastatin Calcium] Other (See Comments)    Muscle aches   Pravastatin Other (See Comments)    Muscle aches   Sulfa Antibiotics Nausea Only   Codeine     hallucinations    OBJECTIVE: Vitals:   09/03/18 1138  BP: 112/76  Pulse: 85  Temp: 97.8 F (36.6 C)  TempSrc: Oral  Weight: 112 lb (50.8 kg)  Height: 5'  5" (1.651 m)   Body mass index is 18.64 kg/m.   Physical Exam Constitutional:      Comments: She is seated in a wheelchair.   She is pleasant and in reasonably good spirits given her pain.  She is accompanied by her husband, [name].  Cardiovascular:     Rate and Rhythm: Normal rate and regular rhythm.     Heart sounds: No murmur.  Pulmonary:     Effort: Pulmonary effort is normal.     Breath sounds: Normal breath sounds.  Abdominal:     Palpations: Abdomen is soft.     Tenderness: There is no abdominal tenderness.  Musculoskeletal:     Comments: Tenderness over her mid spine.  There is no kyphotic deformity or swelling noted.  Psychiatric:        Mood and Affect: Mood normal.     Microbiology: Recent Results (from the past 240 hour(s))  Culture, blood (Routine X 2) w Reflex to ID Panel     Status: None (Preliminary result)   Collection Time: 09/01/18  6:30 PM  Result Value Ref Range Status   Specimen Description BLOOD LEFT ANTECUBITAL  Final   Special Requests   Final    BOTTLES DRAWN AEROBIC AND ANAEROBIC Blood Culture adequate volume   Culture   Final    NO GROWTH 2 DAYS Performed at [facility], [address] [place], [place] [address]    Report Status PENDING  Incomplete  Culture, blood (Routine X 2) w Reflex to ID Panel     Status: None (Preliminary result)   Collection Time: 09/01/18  6:35 PM  Result Value Ref Range Status   Specimen Description BLOOD RIGHT ANTECUBITAL  Final   Special Requests   Final    BOTTLES DRAWN AEROBIC AND ANAEROBIC Blood Culture adequate volume   Culture   Final    NO GROWTH 2 DAYS Performed at [facility], [place] [address] [place],  [address]    Report Status PENDING  Incomplete   Sed Rate (mm/hr)  Date Value  09/01/2018 30 (H)   CRP (mg/dL)  Date Value  09/01/2018 2.5 (H)    Michel Bickers, MD [facility] for Infectious [facility] [phone] pager   [phone] cell 09/03/2018, 12:24 PM

## 2018-09-03 NOTE — Telephone Encounter (Signed)
PICC scheduled for today at 1:00, patient accepted.  RN faxed demographics, IV antibiotic orders (vancomycin and ceftriaxone) and office note to [facility]. Per Areta Haber nursing will be able to start care tonight at 7:00 pm for first dose. They will need to draw BMP before starting IV Vancomycin and Ceftriaxone per Dr Orvan Falconer. RN notified patient's husband, made follow up appointment for 6/15 at 10:00.   Andree Coss, RN

## 2018-09-03 NOTE — Telephone Encounter (Signed)
Called Michelle from [facility] to schedule picc placement for pt. She spoke with [facility] today and they are going to add her on at 1pm. AW

## 2018-09-03 NOTE — Procedures (Signed)
Successful placement of single lumen PICC line to brachial vein. Length 45cm Tip at lower SVC/RA PICC capped No complications Ready for use.  EBL < 5 mL   Hoyt Koch PA-C 09/03/2018 2:41 PM

## 2018-09-06 LAB — CULTURE, BLOOD (ROUTINE X 2)
Culture: NO GROWTH
Culture: NO GROWTH
Special Requests: ADEQUATE
Special Requests: ADEQUATE

## 2018-09-07 ENCOUNTER — Other Ambulatory Visit (HOSPITAL_COMMUNITY)
Admission: RE | Admit: 2018-09-07 | Discharge: 2018-09-07 | Disposition: A | Discharge status: Home / Self Care | Payer: Medicare Other | Source: Other Acute Inpatient Hospital | Attending: Internal Medicine | Admitting: Internal Medicine

## 2018-09-07 DIAGNOSIS — M4640 Discitis, unspecified, site unspecified: Secondary | ICD-10-CM | POA: Diagnosis present

## 2018-09-07 DIAGNOSIS — R7881 Bacteremia: Secondary | ICD-10-CM | POA: Diagnosis present

## 2018-09-07 LAB — CBC WITH DIFFERENTIAL/PLATELET
Abs Immature Granulocytes: 0.03 10*3/uL (ref 0.00–0.07)
Basophils Absolute: 0.1 10*3/uL (ref 0.0–0.1)
Basophils Relative: 1 %
Eosinophils Absolute: 0.1 10*3/uL (ref 0.0–0.5)
Eosinophils Relative: 1 %
HCT: 46.1 % — ABNORMAL HIGH (ref 36.0–46.0)
Hemoglobin: 14.5 g/dL (ref 12.0–15.0)
Immature Granulocytes: 0 %
Lymphocytes Relative: 24 %
Lymphs Abs: 2.3 10*3/uL (ref 0.7–4.0)
MCH: 28.7 pg (ref 26.0–34.0)
MCHC: 31.5 g/dL (ref 30.0–36.0)
MCV: 91.3 fL (ref 80.0–100.0)
Monocytes Absolute: 0.7 10*3/uL (ref 0.1–1.0)
Monocytes Relative: 7 %
Neutro Abs: 6.6 10*3/uL (ref 1.7–7.7)
Neutrophils Relative %: 67 %
Platelets: 295 10*3/uL (ref 150–400)
RBC: 5.05 MIL/uL (ref 3.87–5.11)
RDW: 15.3 % (ref 11.5–15.5)
WBC: 9.8 10*3/uL (ref 4.0–10.5)
nRBC: 0 % (ref 0.0–0.2)

## 2018-09-07 LAB — BASIC METABOLIC PANEL
Anion gap: 12 (ref 5–15)
BUN: 16 mg/dL (ref 8–23)
CO2: 26 mmol/L (ref 22–32)
Calcium: 9.2 mg/dL (ref 8.9–10.3)
Chloride: 101 mmol/L (ref 98–111)
Creatinine, Ser: 0.47 mg/dL (ref 0.44–1.00)
GFR calc Af Amer: >60 mL/min (ref >60)
GFR calc non Af Amer: >60 mL/min (ref >60)
Glucose, Bld: 90 mg/dL (ref 70–99)
Potassium: 3.4 mmol/L — ABNORMAL LOW (ref 3.5–5.1)
Sodium: 139 mmol/L (ref 135–145)

## 2018-09-07 LAB — SEDIMENTATION RATE: Sed Rate: 40 mm/hr — ABNORMAL HIGH (ref 0–22)

## 2018-09-07 LAB — VANCOMYCIN, TROUGH: Vancomycin Tr: 4 ug/mL — ABNORMAL LOW (ref 15–20)

## 2018-09-10 ENCOUNTER — Other Ambulatory Visit (HOSPITAL_COMMUNITY)
Admission: RE | Admit: 2018-09-10 | Discharge: 2018-09-10 | Disposition: A | Discharge status: Home / Self Care | Payer: Medicare Other | Source: Other Acute Inpatient Hospital | Attending: Internal Medicine | Admitting: Internal Medicine

## 2018-09-10 DIAGNOSIS — R7881 Bacteremia: Secondary | ICD-10-CM | POA: Insufficient documentation

## 2018-09-10 LAB — BASIC METABOLIC PANEL
Anion gap: 10 (ref 5–15)
BUN: 21 mg/dL (ref 8–23)
CO2: 28 mmol/L (ref 22–32)
Calcium: 9.5 mg/dL (ref 8.9–10.3)
Chloride: 103 mmol/L (ref 98–111)
Creatinine, Ser: 0.49 mg/dL (ref 0.44–1.00)
GFR calc Af Amer: >60 mL/min (ref >60)
GFR calc non Af Amer: >60 mL/min (ref >60)
Glucose, Bld: 82 mg/dL (ref 70–99)
Potassium: 3.7 mmol/L (ref 3.5–5.1)
Sodium: 141 mmol/L (ref 135–145)

## 2018-09-10 LAB — VANCOMYCIN, TROUGH: Vancomycin Tr: 5 ug/mL — ABNORMAL LOW (ref 15–20)

## 2018-09-14 ENCOUNTER — Other Ambulatory Visit (HOSPITAL_COMMUNITY)
Admission: RE | Admit: 2018-09-14 | Discharge: 2018-09-14 | Disposition: A | Discharge status: Home / Self Care | Payer: Medicare Other | Source: Other Acute Inpatient Hospital | Attending: Internal Medicine | Admitting: Internal Medicine

## 2018-09-14 DIAGNOSIS — R7881 Bacteremia: Secondary | ICD-10-CM | POA: Diagnosis present

## 2018-09-14 DIAGNOSIS — M4640 Discitis, unspecified, site unspecified: Secondary | ICD-10-CM | POA: Diagnosis present

## 2018-09-14 LAB — BASIC METABOLIC PANEL
Anion gap: 10 (ref 5–15)
BUN: 20 mg/dL (ref 8–23)
CO2: 28 mmol/L (ref 22–32)
Calcium: 9.2 mg/dL (ref 8.9–10.3)
Chloride: 103 mmol/L (ref 98–111)
Creatinine, Ser: 0.49 mg/dL (ref 0.44–1.00)
GFR calc Af Amer: >60 mL/min (ref >60)
GFR calc non Af Amer: >60 mL/min (ref >60)
Glucose, Bld: 155 mg/dL — ABNORMAL HIGH (ref 70–99)
Potassium: 3.4 mmol/L — ABNORMAL LOW (ref 3.5–5.1)
Sodium: 141 mmol/L (ref 135–145)

## 2018-09-14 LAB — CBC WITH DIFFERENTIAL/PLATELET
Abs Immature Granulocytes: 0.02 10*3/uL (ref 0.00–0.07)
Basophils Absolute: 0 10*3/uL (ref 0.0–0.1)
Basophils Relative: 0 %
Eosinophils Absolute: 0.1 10*3/uL (ref 0.0–0.5)
Eosinophils Relative: 1 %
HCT: 44.9 % (ref 36.0–46.0)
Hemoglobin: 14.1 g/dL (ref 12.0–15.0)
Immature Granulocytes: 0 %
Lymphocytes Relative: 24 %
Lymphs Abs: 2.2 10*3/uL (ref 0.7–4.0)
MCH: 29.4 pg (ref 26.0–34.0)
MCHC: 31.4 g/dL (ref 30.0–36.0)
MCV: 93.5 fL (ref 80.0–100.0)
Monocytes Absolute: 0.6 10*3/uL (ref 0.1–1.0)
Monocytes Relative: 7 %
Neutro Abs: 6.3 10*3/uL (ref 1.7–7.7)
Neutrophils Relative %: 68 %
Platelets: 289 10*3/uL (ref 150–400)
RBC: 4.8 MIL/uL (ref 3.87–5.11)
RDW: 16.4 % — ABNORMAL HIGH (ref 11.5–15.5)
WBC: 9.1 10*3/uL (ref 4.0–10.5)
nRBC: 0 % (ref 0.0–0.2)

## 2018-09-14 LAB — VANCOMYCIN, TROUGH: Vancomycin Tr: 8 ug/mL — ABNORMAL LOW (ref 15–20)

## 2018-09-15 ENCOUNTER — Other Ambulatory Visit: Payer: Self-pay | Admitting: Internal Medicine

## 2018-09-15 ENCOUNTER — Telehealth: Payer: Self-pay | Admitting: Internal Medicine

## 2018-09-15 DIAGNOSIS — M4644 Discitis, unspecified, thoracic region: Secondary | ICD-10-CM

## 2018-09-15 MED ORDER — SODIUM CHLORIDE 0.9 % IV SOLN: 8.0000 mg/kg | Freq: Every day | INTRAVENOUS | Status: DC

## 2018-09-15 MED ORDER — CEFTRIAXONE SODIUM 1 G IJ SOLR: 2.0000 g | INTRAMUSCULAR | 0 refills | Status: DC

## 2018-09-15 NOTE — Telephone Encounter (Signed)
I received a phone call today from the [facility] pharmacist about the inability to get Beverly Li's vancomycin trough level into the therapeutic range.  Rather than continuing to increase the dose of vancomycin and give it twice daily I decided to change vancomycin to IV daptomycin 8 mg/kg daily and continue ceftriaxone.

## 2018-09-21 ENCOUNTER — Other Ambulatory Visit (HOSPITAL_COMMUNITY)
Admission: RE | Admit: 2018-09-21 | Discharge: 2018-09-21 | Disposition: A | Discharge status: Home / Self Care | Payer: Medicare Other | Source: Other Acute Inpatient Hospital | Attending: Internal Medicine | Admitting: Internal Medicine

## 2018-09-21 DIAGNOSIS — R7881 Bacteremia: Secondary | ICD-10-CM | POA: Diagnosis present

## 2018-09-21 LAB — COMPREHENSIVE METABOLIC PANEL
ALT: 14 U/L (ref 0–44)
AST: 20 U/L (ref 15–41)
Albumin: 3.7 g/dL (ref 3.5–5.0)
Alkaline Phosphatase: 94 U/L (ref 38–126)
Anion gap: 9 (ref 5–15)
BUN: 24 mg/dL — ABNORMAL HIGH (ref 8–23)
CO2: 28 mmol/L (ref 22–32)
Calcium: 9.2 mg/dL (ref 8.9–10.3)
Chloride: 105 mmol/L (ref 98–111)
Creatinine, Ser: 0.41 mg/dL — ABNORMAL LOW (ref 0.44–1.00)
GFR calc Af Amer: >60 mL/min (ref >60)
GFR calc non Af Amer: >60 mL/min (ref >60)
Glucose, Bld: 124 mg/dL — ABNORMAL HIGH (ref 70–99)
Potassium: 3.7 mmol/L (ref 3.5–5.1)
Sodium: 142 mmol/L (ref 135–145)
Total Bilirubin: 0.5 mg/dL (ref 0.3–1.2)
Total Protein: 6.6 g/dL (ref 6.5–8.1)

## 2018-09-21 LAB — CBC WITH DIFFERENTIAL/PLATELET
Abs Immature Granulocytes: 0.04 10*3/uL (ref 0.00–0.07)
Basophils Absolute: 0 10*3/uL (ref 0.0–0.1)
Basophils Relative: 0 %
Eosinophils Absolute: 0.1 10*3/uL (ref 0.0–0.5)
Eosinophils Relative: 1 %
HCT: 45.4 % (ref 36.0–46.0)
Hemoglobin: 14.1 g/dL (ref 12.0–15.0)
Immature Granulocytes: 0 %
Lymphocytes Relative: 24 %
Lymphs Abs: 2.1 10*3/uL (ref 0.7–4.0)
MCH: 29.1 pg (ref 26.0–34.0)
MCHC: 31.1 g/dL (ref 30.0–36.0)
MCV: 93.6 fL (ref 80.0–100.0)
Monocytes Absolute: 0.6 10*3/uL (ref 0.1–1.0)
Monocytes Relative: 6 %
Neutro Abs: 6.2 10*3/uL (ref 1.7–7.7)
Neutrophils Relative %: 69 %
Platelets: 306 10*3/uL (ref 150–400)
RBC: 4.85 MIL/uL (ref 3.87–5.11)
RDW: 17.2 % — ABNORMAL HIGH (ref 11.5–15.5)
WBC: 9 10*3/uL (ref 4.0–10.5)
nRBC: 0 % (ref 0.0–0.2)

## 2018-09-21 LAB — CK: Total CK: 38 U/L (ref 38–234)

## 2018-09-28 ENCOUNTER — Other Ambulatory Visit (HOSPITAL_COMMUNITY)
Admission: RE | Admit: 2018-09-28 | Discharge: 2018-09-28 | Disposition: A | Discharge status: Home / Self Care | Payer: Medicare Other | Source: Other Acute Inpatient Hospital | Attending: Internal Medicine | Admitting: Internal Medicine

## 2018-09-28 ENCOUNTER — Other Ambulatory Visit: Payer: Self-pay

## 2018-09-28 ENCOUNTER — Encounter: Payer: Self-pay | Admitting: Internal Medicine

## 2018-09-28 ENCOUNTER — Ambulatory Visit (INDEPENDENT_AMBULATORY_CARE_PROVIDER_SITE_OTHER): Payer: Medicare Other | Admitting: Internal Medicine

## 2018-09-28 ENCOUNTER — Telehealth: Payer: Self-pay | Admitting: *Deleted

## 2018-09-28 DIAGNOSIS — R7881 Bacteremia: Secondary | ICD-10-CM | POA: Insufficient documentation

## 2018-09-28 DIAGNOSIS — M4644 Discitis, unspecified, thoracic region: Secondary | ICD-10-CM | POA: Diagnosis not present

## 2018-09-28 DIAGNOSIS — M4640 Discitis, unspecified, site unspecified: Secondary | ICD-10-CM | POA: Diagnosis present

## 2018-09-28 LAB — COMPREHENSIVE METABOLIC PANEL
ALT: 14 U/L (ref 0–44)
AST: 20 U/L (ref 15–41)
Albumin: 3.7 g/dL (ref 3.5–5.0)
Alkaline Phosphatase: 93 U/L (ref 38–126)
Anion gap: 10 (ref 5–15)
BUN: 25 mg/dL — ABNORMAL HIGH (ref 8–23)
CO2: 25 mmol/L (ref 22–32)
Calcium: 9.4 mg/dL (ref 8.9–10.3)
Chloride: 107 mmol/L (ref 98–111)
Creatinine, Ser: 0.53 mg/dL (ref 0.44–1.00)
GFR calc Af Amer: >60 mL/min (ref >60)
GFR calc non Af Amer: >60 mL/min (ref >60)
Glucose, Bld: 126 mg/dL — ABNORMAL HIGH (ref 70–99)
Potassium: 4.2 mmol/L (ref 3.5–5.1)
Sodium: 142 mmol/L (ref 135–145)
Total Bilirubin: 0.4 mg/dL (ref 0.3–1.2)
Total Protein: 6.7 g/dL (ref 6.5–8.1)

## 2018-09-28 LAB — CBC WITH DIFFERENTIAL/PLATELET
Abs Immature Granulocytes: 0.02 10*3/uL (ref 0.00–0.07)
Basophils Absolute: 0 10*3/uL (ref 0.0–0.1)
Basophils Relative: 1 %
Eosinophils Absolute: 0.1 10*3/uL (ref 0.0–0.5)
Eosinophils Relative: 1 %
HCT: 45.9 % (ref 36.0–46.0)
Hemoglobin: 14.3 g/dL (ref 12.0–15.0)
Immature Granulocytes: 0 %
Lymphocytes Relative: 28 %
Lymphs Abs: 2.3 10*3/uL (ref 0.7–4.0)
MCH: 29.5 pg (ref 26.0–34.0)
MCHC: 31.2 g/dL (ref 30.0–36.0)
MCV: 94.8 fL (ref 80.0–100.0)
Monocytes Absolute: 0.6 10*3/uL (ref 0.1–1.0)
Monocytes Relative: 7 %
Neutro Abs: 5.3 10*3/uL (ref 1.7–7.7)
Neutrophils Relative %: 63 %
Platelets: 328 10*3/uL (ref 150–400)
RBC: 4.84 MIL/uL (ref 3.87–5.11)
RDW: 17.9 % — ABNORMAL HIGH (ref 11.5–15.5)
WBC: 8.4 10*3/uL (ref 4.0–10.5)
nRBC: 0 % (ref 0.0–0.2)

## 2018-09-28 LAB — C-REACTIVE PROTEIN: CRP: 0.9 mg/dL (ref <1.0)

## 2018-09-28 LAB — SEDIMENTATION RATE: Sed Rate: 20 mm/hr (ref 0–22)

## 2018-09-28 LAB — CK: Total CK: 49 U/L (ref 38–234)

## 2018-09-28 MED ORDER — ONDANSETRON HCL 4 MG PO TABS: 4.0000 mg | ORAL_TABLET | Freq: Three times a day (TID) | ORAL | 1 refills | Status: DC | PRN

## 2018-09-28 NOTE — Progress Notes (Signed)
[facility]  Patient Active Problem List   Diagnosis Date Noted  . 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 Visit for monitoring Tikosyn therapy 11/18/2017  . Closed nondisplaced fracture of head of radius with routine healing 03/03/2017  . Coronary artery disease involving native coronary artery of native heart without angina pectoris   . Chronic diastolic heart failure (HCC) 03/28/2015  . Paroxysmal atrial fibrillation with rapid ventricular response (HCC)   . Essential hypertension   . S/P femoral-popliteal bypass surgery   . 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  . PAD (peripheral artery disease) (HCC) 03/02/2015  . Claudication (HCC) 01/31/2015  . 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 Hyperlipidemia 01/31/2015  . Atherosclerosis of native arteries of extremity with intermittent claudication (HCC) 02/10/2014  . 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 Atrial arrhythmia   . Mild aortic sclerosis (HCC)     Patient's Medications  New Prescriptions   ONDANSETRON (ZOFRAN) 4 MG TABLET    Take 1 tablet (4 mg total) by mouth every 8 (eight) hours as needed for nausea or vomiting.  Previous Medications   ACETAMINOPHEN (TYLENOL) 500 MG TABLET    Take 1,500 mg by mouth every 6 (six) hours as needed (for pain.).   ALENDRONATE (FOSAMAX) 70 MG TABLET    Take 70 mg by mouth every Monday. Take with a full glass of water on an empty stomach.   ASPIRIN EC 81 MG TABLET    Take 81 mg by mouth daily.   BUPROPION (WELLBUTRIN XL) 150 MG 24 HR TABLET    Take 150 mg by mouth every morning.    BYSTOLIC 2.5 MG TABLET    TAKE 1 TABLET(2.5 MG) BY MOUTH AT BEDTIME   CALCIUM-PHOSPHORUS-VITAMIN D (CALCIUM/D3 ADULT GUMMIES PO)    Take 2 tablets by mouth daily at 2 PM.   CEFTRIAXONE (ROCEPHIN) 1 G INJECTION    Inject 2 g into the muscle daily.   CYCLOBENZAPRINE (FLEXERIL) 10 MG TABLET    Take 0.5 tablets (5 mg  total) by mouth 3 (three) times daily as needed for muscle spasms.   DAPTOMYCIN 406.5 MG IN SODIUM CHLORIDE 0.9 % 100 ML    Inject 406.5 mg into the vein daily at 8 pm.   DEXILANT 30 MG CAPSULE    Take 30 mg by mouth daily before breakfast.    DILTIAZEM (CARDIZEM) 30 MG TABLET    Take 30 mg by mouth daily as needed (blood pressure higher than 160).    DOFETILIDE (TIKOSYN) 250 MCG CAPSULE    TAKE 1 CAPSULE(250 MCG) BY MOUTH TWICE DAILY   ELIQUIS 5 MG TABS TABLET    TAKE 1 TABLET(5 MG) BY MOUTH TWICE DAILY   EZETIMIBE (ZETIA) 10 MG TABLET    TAKE 1 TABLET(10 MG) BY MOUTH DAILY   HYDRALAZINE (APRESOLINE) 25 MG TABLET    TAKE 1 TABLET BY MOUTH EVERY 8 HOURS AS NEEDED FOR SYSTOLIC BLOOD PRESSURE(TOP NUMBER)>160   HYDROCODONE-ACETAMINOPHEN (NORCO/VICODIN) 5-325 MG TABLET    TAKE 1 TO 2 TABLETS EVERY 6 HOURS AS NEEDED FOR SEVERE BACK PAIN. DO NOT TAKE WITH TYLENOL   MIDODRINE (PROAMATINE) 10 MG TABLET    Take 10 mg by mouth 3 (three) times daily with meals as needed (for systolic blood pressure lower than 100).    MULTIPLE VITAMINS-IRON (STRESS B COMPLEX/IRON) TABS    Take 1 tablet by mouth at bedtime.  POTASSIUM CHLORIDE (K-DUR,KLOR-CON) 10 MEQ TABLET    TAKE 1 TABLET(10 MEQ) BY MOUTH DAILY   PROAIR HFA 108 (90 BASE) MCG/ACT INHALER    Inhale 2 puffs into the lungs 4 (four) times daily as needed (for wheezing/shortness of breath.).    ROSUVASTATIN (CRESTOR) 5 MG TABLET    Take 0.5 tablets (2.5 mg total) by mouth daily.   ZOLPIDEM (AMBIEN) 5 MG TABLET    Take 5 mg by mouth at bedtime.   Modified Medications   No medications on file  Discontinued Medications   No medications on file    Subjective: Beverly Li is in with her husband, Beverly Li, for her routine follow-up visit.  She is now been on empiric IV antibiotic therapy for 26 days for her thoracic discitis.  Initially, she was on vancomycin and ceftriaxone but I changed to vancomycin to daptomycin on 09/15/2018 because we were unable to get her vancomycin  trough levels into the therapeutic range.  She is not aware of having any side effects of her antibiotics or PICC.  However she says that she has no appetite and if she tries to eat a full meal she will have some nausea.  She has not had any vomiting or diarrhea.  She has not had any fever.  She has noted that her back pain has started to improve.  She is now able to stand and walk inside of her house without as much pain as before.   Review of Systems: Review of Systems  Constitutional: Positive for malaise/fatigue and weight loss. Negative for chills, diaphoresis and fever.  Gastrointestinal: Positive for nausea. Negative for abdominal pain, diarrhea and vomiting.  Musculoskeletal: Positive for back pain.    Past Medical History:  Diagnosis Date  . Anxiety   . Arthritis    "some in my lower back; probably elbows, knees" (11/18/2017)  . Atrial fibrillation (HCC)   . Bell's palsy    when pt. was 73 yrs old, when under stress the left side of face will droop.  . Complication of anesthesia    "vascular OR 2016; BP bottomed out; couldn't get it regulated; ended up in ICU for DAYS" (11/18/2017)  . GERD (gastroesophageal reflux disease)   . Heart murmur   . History of kidney stones   . Hypertension   . Hypertrophic cardiomyopathy (HCC)    severe LV basilar hypertrophy witn no evidence of significant outflow tract obstruction, EF 65-70%, mild LAE, mild TR, grade 1a diastolic dysfunction 05/15/10 (Dr. Donato SchultzMark Skains) (Atrial Septal Hypertrophy pattern)-- Intra-op TEE with dsignificant outflow tract obstruction - AI, MR & TR  . Insomnia   . Mild aortic sclerosis (HCC)   . Osteopenia   . Peripheral vascular disease (HCC)   . Syncope    , Vagal    Social History   Tobacco Use  . Smoking status: Former Smoker    Packs/day: 1.00    Years: 50.00    Pack years: 50.00    Types: Cigarettes    Quit date: 12/15/2014    Years since quitting: 3.7  . Smokeless tobacco: Never Used  Substance Use Topics   . Alcohol use: Yes    Comment: 11/18/2017 "might have a couple glasses of wine/month; if that"  . Drug use: Never    Family History  Problem Relation Age of Onset  . Liver cancer Mother   . Cancer Mother        Liver  . Hypertension Mother   . Lung cancer Father   .  Cancer Father        Lung  . Breast cancer Sister   . Cancer Sister        Breast    Allergies  Allergen Reactions  . Amoxicillin Other (See Comments)    UTI Has patient had a PCN reaction causing immediate rash, facial/tongue/throat swelling, SOB or lightheadedness with hypotension: No Has patient had a PCN reaction causing severe rash involving mucus membranes or skin necrosis: No Has patient had a PCN reaction that required hospitalization: No Has patient had a PCN reaction occurring within the last 10 years: Yes--UTI ONLY If all of the above answers are "NO", then may proceed with Cephalosporin use.   . Atenolol Cough  . Crestor [Rosuvastatin Calcium] Other (See Comments)    Muscle aches  . Pravastatin Other (See Comments)    Muscle aches  . Sulfa Antibiotics Nausea Only  . Codeine     hallucinations    Objective: Vitals:   09/28/18 0957  BP: (!) 155/90  Pulse: 73  SpO2: 95%   There is no height or weight on file to calculate BMI.  Physical Exam Constitutional:      Comments: She has lost 14 pounds in the past month.  She is seated in a wheelchair.  Musculoskeletal:     Comments: I do not note any bony prominence along her spine.  She is able to stand without much difficulty.  I can feel the firmness in her right groin that she notes but I can also feel a similar firmness in her left groin.  Skin:    Comments: Her left arm PICC site looks good.     Lab Results Sed Rate (mm/hr)  Date Value  09/07/2018 40 (H)  09/01/2018 30 (H)   CRP (mg/dL)  Date Value  09/01/2018 2.5 (H)     Problem List Items Addressed This Visit      High   Thoracic discitis    Her pain is beginning to improve  we will broad empiric therapy for thoracic discitis.  I will prescribe ondansetron for her nausea.  I have encouraged her to eat frequent small snacks.  I am not sure if the firmness in her groin or lymph nodes or muscles.  Suspect these have simply become obvious to her because she has lost so much weight.  I am comfortable simply following them for now.  I plan on continuing antibiotics for at least 4 more weeks to give her 6 full weeks of daptomycin and ceftriaxone.  She will follow-up in 1 month.      Relevant Medications   ondansetron (ZOFRAN) 4 MG tablet       Michel Bickers, MD [facility] for Infect[facility] [facility] [phone] pager   [phone] cell 09/28/2018, 10:55 AM

## 2018-09-28 NOTE — Assessment & Plan Note (Signed)
Her pain is beginning to improve we will broad empiric therapy for thoracic discitis.  I will prescribe ondansetron for her nausea.  I have encouraged her to eat frequent small snacks.  I am not sure if the firmness in her groin or lymph nodes or muscles.  Suspect these have simply become obvious to her because she has lost so much weight.  I am comfortable simply following them for now.  I plan on continuing antibiotics for at least 4 more weeks to give her 6 full weeks of daptomycin and ceftriaxone.  She will follow-up in 1 month.

## 2018-09-28 NOTE — Telephone Encounter (Signed)
Relayed verbal orders per Dr Megan Salon to Amy at [facility] to confirm weekly CBC, CMP, CK, CRP, ESR faxed to [facility]. New end date for IV antibiotics 7/20. Orders repeated and verified. Landis Gandy, RN

## 2018-10-02 ENCOUNTER — Telehealth: Payer: Self-pay

## 2018-10-02 MED ORDER — HYDROCODONE-ACETAMINOPHEN 5-325 MG PO TABS: ORAL_TABLET | ORAL | 0 refills | Status: DC

## 2018-10-02 NOTE — Telephone Encounter (Signed)
Started the daptomycin for about 2 weeks. Stomach aches begin one week ago between the ribs in the front.  At office visit she she was given Zofran which did not help. .  The pain is so bad it wakes her up from sleep at night. Pain is about 4 inches below belly button in middle , about 7 inches wide.  A constant pinching feeling.  If she presses the area with her hand it relieves the pain.  But there is no bulging  ing in the area. Just very sore.  Tylenol did ease the pain some but did not stop the pain.   Patient states this is the same area Dr Megan Salon examined in the office.    Please advise.

## 2018-10-02 NOTE — Telephone Encounter (Signed)
Voicemail: Patient states he stomach pain I very severe every night and day.  The medication given for stomach pain last week does not work.    Left message to call back  Please advise.

## 2018-10-02 NOTE — Telephone Encounter (Signed)
Patient intolerant of codeine but did well with Norco from previous script 2 months ago. Will give 5-325 mg tablets #20 with instructions to take one every 6 hours for severe pain. Attention was made to include in her Rx to not take with tylenol.   [place] website reviewed.

## 2018-10-02 NOTE — Telephone Encounter (Signed)
I spoke with Dr Megan Salon , he is concerned that she continues to have this pain and the area of has spread.  He does not think it is related to the antibiotics she is currently receiving and would like to see her in the office on Monday, October 05, 2018 @ 11:15 am.   In the meantime he has requested Dr.  Baxter Flattery to send a low dose pain medication.  Oxycodone 5 mg .   Patient was advised to go to the ED if symptoms change or worsen in any way. I explained the ER is completley safe for emergencies during this epidemic.  She has agreed to go if there is a change in status of pain or area of concern.

## 2018-10-05 ENCOUNTER — Ambulatory Visit: Payer: Medicare Other | Admitting: Internal Medicine

## 2018-10-05 ENCOUNTER — Other Ambulatory Visit: Payer: Self-pay

## 2018-10-05 DIAGNOSIS — K5900 Constipation, unspecified: Secondary | ICD-10-CM | POA: Insufficient documentation

## 2018-10-05 DIAGNOSIS — R634 Abnormal weight loss: Secondary | ICD-10-CM | POA: Diagnosis not present

## 2018-10-05 DIAGNOSIS — M4644 Discitis, unspecified, thoracic region: Secondary | ICD-10-CM

## 2018-10-05 NOTE — Assessment & Plan Note (Signed)
I believe her thoracic discitis is responding to broad empiric antibiotic therapy.  Her pain is decreasing and she is able to be more active.  Her inflammatory markers have normalized.  I plan on continuing her current antibiotic therapy until she follows appear on 11/02/2018.

## 2018-10-05 NOTE — Assessment & Plan Note (Signed)
Unfortunately she continues to lose weight.  I have asked her to treat her constipation and try Zofran for her nausea.  I asked her to redouble her efforts to eat regular small portions and snacks.

## 2018-10-05 NOTE — Progress Notes (Signed)
[facility]  Patient Active Problem List   Diagnosis Date Noted  . 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 Unintentional weight loss 10/05/2018    Priority: Medium  . Constipation 10/05/2018    Priority: Medium  . Visit for monitoring Tikosyn therapy 11/18/2017  . Closed nondisplaced fracture of head of radius with routine healing 03/03/2017  . Coronary artery disease involving native coronary artery of native heart without angina pectoris   . Chronic diastolic heart failure (HCC) 03/28/2015  . Paroxysmal atrial fibrillation with rapid ventricular response (HCC)   . Essential hypertension   . S/P femoral-popliteal bypass surgery   . 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  . PAD (peripheral artery disease) (HCC) 03/02/2015  . Claudication (HCC) 01/31/2015  . 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 Hyperlipidemia 01/31/2015  . Atherosclerosis of native arteries of extremity with intermittent claudication (HCC) 02/10/2014  . 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 Atrial arrhythmia   . Mild aortic sclerosis (HCC)     Patient's Medications  New Prescriptions   No medications on file  Previous Medications   ACETAMINOPHEN (TYLENOL) 500 MG TABLET    Take 1,500 mg by mouth every 6 (six) hours as needed (for pain.).   ALENDRONATE (FOSAMAX) 70 MG TABLET    Take 70 mg by mouth every Monday. Take with a full glass of water on an empty stomach.   ASPIRIN EC 81 MG TABLET    Take 81 mg by mouth daily.   BUPROPION (WELLBUTRIN XL) 150 MG 24 HR TABLET    Take 150 mg by mouth every morning.    BYSTOLIC 2.5 MG TABLET    TAKE 1 TABLET(2.5 MG) BY MOUTH AT BEDTIME   CALCIUM-PHOSPHORUS-VITAMIN D (CALCIUM/D3 ADULT GUMMIES PO)    Take 2 tablets by mouth daily at 2 PM.   CEFTRIAXONE (ROCEPHIN) 1 G INJECTION    Inject 2 g into the muscle daily.   CYCLOBENZAPRINE (FLEXERIL) 10 MG TABLET    Take 0.5 tablets (5  mg total) by mouth 3 (three) times daily as needed for muscle spasms.   DAPTOMYCIN 406.5 MG IN SODIUM CHLORIDE 0.9 % 100 ML    Inject 406.5 mg into the vein daily at 8 pm.   DEXILANT 30 MG CAPSULE    Take 30 mg by mouth daily before breakfast.    DILTIAZEM (CARDIZEM) 30 MG TABLET    Take 30 mg by mouth daily as needed (blood pressure higher than 160).    DOFETILIDE (TIKOSYN) 250 MCG CAPSULE    TAKE 1 CAPSULE(250 MCG) BY MOUTH TWICE DAILY   ELIQUIS 5 MG TABS TABLET    TAKE 1 TABLET(5 MG) BY MOUTH TWICE DAILY   EZETIMIBE (ZETIA) 10 MG TABLET    TAKE 1 TABLET(10 MG) BY MOUTH DAILY   HYDRALAZINE (APRESOLINE) 25 MG TABLET    TAKE 1 TABLET BY MOUTH EVERY 8 HOURS AS NEEDED FOR SYSTOLIC BLOOD PRESSURE(TOP NUMBER)>160   HYDROCODONE-ACETAMINOPHEN (NORCO/VICODIN) 5-325 MG TABLET    TAKE 1 TABLET EVERY 6 HOURS AS NEEDED FOR SEVERE BACK PAIN. DO NOT TAKE WITH TYLENOL   MIDODRINE (PROAMATINE) 10 MG TABLET    Take 10 mg by mouth 3 (three) times daily with meals as needed (for systolic blood pressure lower than 100).    MULTIPLE VITAMINS-IRON (STRESS B COMPLEX/IRON) TABS    Take 1 tablet by mouth at bedtime.    ONDANSETRON (ZOFRAN) 4 MG  TABLET    Take 1 tablet (4 mg total) by mouth every 8 (eight) hours as needed for nausea or vomiting.   POTASSIUM CHLORIDE (K-DUR,KLOR-CON) 10 MEQ TABLET    TAKE 1 TABLET(10 MEQ) BY MOUTH DAILY   PROAIR HFA 108 (90 BASE) MCG/ACT INHALER    Inhale 2 puffs into the lungs 4 (four) times daily as needed (for wheezing/shortness of breath.).    ROSUVASTATIN (CRESTOR) 5 MG TABLET    Take 0.5 tablets (2.5 mg total) by mouth daily.   ZOLPIDEM (AMBIEN) 5 MG TABLET    Take 5 mg by mouth at bedtime.   Modified Medications   No medications on file  Discontinued Medications   No medications on file    Subjective: Beverly Li is seen on a work in basis today.  She is accompanied by her husband, Beverly Li.  After I saw her 1 week ago she began to develop worsening, intermittent mid abdominal pain.   She described it as sharp.  She does not know of anything which causes it to come on or worsen.  Over the same time.  She has become constipated.  She thinks it has been 5 days since she had a normal bowel movement.  She started taking Dulcolax 2 days ago and was only able to pass a few "rabbit pellets" last night.  Her appetite remains poor.  She states that she gets full very easily.  When she feels full she will sometimes feel slightly nauseous.  She has not tried taking Zofran.    She has now completed 33 days of total IV antibiotic therapy for her thoracic discitis.  She has been on daptomycin and ceftriaxone for 20 days.  I have had some concern that her abdominal pain might be related to daptomycin.  She is not having any problems tolerating her PICC.  Both of them have noticed that her back pain is significantly improved over the past month.  She is able to do much more activity now.  She says she does not feel unsteady on her feet recently.  She stumbled in her bathroom when trying to get into the bathtub.  She says that she was able to catch herself before falling into the tub.  She is not sure if she hit the wall.  Shortly after that she noticed a lump in her right buttock.  She is not sure if it is new.  It is nontender.  She feels like the swelling and firmness in her groin has improved.  Review of Systems: Review of Systems  Constitutional: Positive for malaise/fatigue and weight loss. Negative for chills, diaphoresis and fever.  Respiratory: Negative for cough, sputum production and shortness of breath.   Cardiovascular: Negative for chest pain.  Gastrointestinal: Positive for abdominal pain, constipation and nausea. Negative for diarrhea and vomiting.  Genitourinary: Negative for dysuria.  Musculoskeletal: Positive for back pain.  Skin: Negative for rash.    Past Medical History:  Diagnosis Date  . Anxiety   . Arthritis    "some in my lower back; probably elbows, knees"  (11/18/2017)  . Atrial fibrillation ([place])   . Bell's palsy    when pt. was 73 yrs old, when under stress the left side of face will droop.  . Complication of anesthesia    "vascular OR 2016; BP bottomed out; couldn't get it regulated; ended up in ICU for DAYS" (11/18/2017)  . GERD (gastroesophageal reflux disease)   . Heart murmur   . History of kidney stones   .  Hypertension   . Hypertrophic cardiomyopathy (HCC)    severe LV basilar hypertrophy witn no evidence of significant outflow tract obstruction, EF 65-70%, mild LAE, mild TR, grade 1a diastolic dysfunction 05/15/10 (Dr. Donato SchultzMark Skains) (Atrial Septal Hypertrophy pattern)-- Intra-op TEE with dsignificant outflow tract obstruction - AI, MR & TR  . Insomnia   . Mild aortic sclerosis (HCC)   . Osteopenia   . Peripheral vascular disease (HCC)   . Syncope    , Vagal    Social History   Tobacco Use  . Smoking status: Former Smoker    Packs/day: 1.00    Years: 50.00    Pack years: 50.00    Types: Cigarettes    Quit date: 12/15/2014    Years since quitting: 3.8  . Smokeless tobacco: Never Used  Substance Use Topics  . Alcohol use: Yes    Comment: 11/18/2017 "might have a couple glasses of wine/month; if that"  . Drug use: Never    Family History  Problem Relation Age of Onset  . Liver cancer Mother   . Cancer Mother        Liver  . Hypertension Mother   . Lung cancer Father   . Cancer Father        Lung  . Breast cancer Sister   . Cancer Sister        Breast    Allergies  Allergen Reactions  . Amoxicillin Other (See Comments)    UTI Has patient had a PCN reaction causing immediate rash, facial/tongue/throat swelling, SOB or lightheadedness with hypotension: No Has patient had a PCN reaction causing severe rash involving mucus membranes or skin necrosis: No Has patient had a PCN reaction that required hospitalization: No Has patient had a PCN reaction occurring within the last 10 years: Yes--UTI ONLY If all of the above  answers are "NO", then may proceed with Cephalosporin use.   . Atenolol Cough  . Crestor [Rosuvastatin Calcium] Other (See Comments)    Muscle aches  . Pravastatin Other (See Comments)    Muscle aches  . Sulfa Antibiotics Nausea Only  . Codeine     hallucinations    Objective: Vitals:   10/05/18 1151  Weight: 103 lb (46.7 kg)   Body mass index is 17.14 kg/m.  Physical Exam Constitutional:      Comments: She continues to lose weight.  She has lost 23 pounds in the past month including 9 in the past week.  She is seated in a wheelchair.  Her sense of humor is intact.  Cardiovascular:     Rate and Rhythm: Normal rate and regular rhythm.     Heart sounds: No murmur.  Pulmonary:     Effort: Pulmonary effort is normal.     Breath sounds: Normal breath sounds.  Abdominal:     Palpations: Abdomen is soft. There is no mass.     Tenderness: There is no abdominal tenderness.     Comments: I still note linear firmness in her groin bilaterally.  This feels most like a band of muscles rather than lymph nodes.  There is not been any enlargement in the last week.  She has a firm nodule in her right buttocks without any overlying inflammation.  It is nontender to palpation.  There is no bruising.  Psychiatric:        Mood and Affect: Mood normal.     Lab Results Lab Results  Component Value Date   WBC 8.4 09/28/2018   HGB 14.3 09/28/2018  HCT 45.9 09/28/2018   MCV 94.8 09/28/2018   PLT 328 09/28/2018   CMP     Component Value Date/Time   NA 142 09/28/2018 1629   NA 143 09/23/2017 1629   K 4.2 09/28/2018 1629   CL 107 09/28/2018 1629   CO2 25 09/28/2018 1629   GLUCOSE 126 (H) 09/28/2018 1629   BUN 25 (H) 09/28/2018 1629   BUN 29 (H) 09/23/2017 1629   CREATININE 0.53 09/28/2018 1629   CALCIUM 9.4 09/28/2018 1629   PROT 6.7 09/28/2018 1629   ALBUMIN 3.7 09/28/2018 1629   AST 20 09/28/2018 1629   ALT 14 09/28/2018 1629   ALKPHOS 93 09/28/2018 1629   BILITOT 0.4  09/28/2018 1629   GFRNONAA >60 09/28/2018 1629   GFRAA >60 09/28/2018 1629   Sed Rate (mm/hr)  Date Value  09/28/2018 20  09/07/2018 40 (H)  09/01/2018 30 (H)   CRP (mg/dL)  Date Value  16/10/960406/15/2020 0.9  09/01/2018 2.5 (H)   CK 09/28/2018: Normal   Problem List Items Addressed This Visit      High   Thoracic discitis    I believe her thoracic discitis is responding to broad empiric antibiotic therapy.  Her pain is decreasing and she is able to be more active.  Her inflammatory markers have normalized.  I plan on continuing her current antibiotic therapy until she follows appear on 11/02/2018.        Medium   Unintentional weight loss    Unfortunately she continues to lose weight.  I have asked her to treat her constipation and try Zofran for her nausea.  I asked her to redouble her efforts to eat regular small portions and snacks.      Constipation    I suspect that her recent abdominal pain is due to constipation and have instructed her to take a dose of MiraLAX today.  I also encouraged her to minimize use of narcotic pain medication.          Cliffton AstersJohn Lean Jaeger, MD [facility] [phone] pager   [phone] cell 10/05/2018, 2:10 PM

## 2018-10-05 NOTE — Assessment & Plan Note (Signed)
I suspect that her recent abdominal pain is due to constipation and have instructed her to take a dose of MiraLAX today.  I also encouraged her to minimize use of narcotic pain medication.

## 2018-10-12 ENCOUNTER — Other Ambulatory Visit (HOSPITAL_COMMUNITY)
Admission: RE | Admit: 2018-10-12 | Discharge: 2018-10-12 | Disposition: A | Discharge status: Home / Self Care | Payer: Medicare Other | Source: Other Acute Inpatient Hospital | Attending: Internal Medicine | Admitting: Internal Medicine

## 2018-10-12 DIAGNOSIS — R7881 Bacteremia: Secondary | ICD-10-CM | POA: Diagnosis present

## 2018-10-12 DIAGNOSIS — M4640 Discitis, unspecified, site unspecified: Secondary | ICD-10-CM | POA: Insufficient documentation

## 2018-10-12 LAB — CBC WITH DIFFERENTIAL/PLATELET
Abs Immature Granulocytes: 0.02 10*3/uL (ref 0.00–0.07)
Basophils Absolute: 0.1 10*3/uL (ref 0.0–0.1)
Basophils Relative: 1 %
Eosinophils Absolute: 0.2 10*3/uL (ref 0.0–0.5)
Eosinophils Relative: 2 %
HCT: 46.8 % — ABNORMAL HIGH (ref 36.0–46.0)
Hemoglobin: 14.4 g/dL (ref 12.0–15.0)
Immature Granulocytes: 0 %
Lymphocytes Relative: 27 %
Lymphs Abs: 2.4 10*3/uL (ref 0.7–4.0)
MCH: 29.6 pg (ref 26.0–34.0)
MCHC: 30.8 g/dL (ref 30.0–36.0)
MCV: 96.3 fL (ref 80.0–100.0)
Monocytes Absolute: 0.7 10*3/uL (ref 0.1–1.0)
Monocytes Relative: 8 %
Neutro Abs: 5.6 10*3/uL (ref 1.7–7.7)
Neutrophils Relative %: 62 %
Platelets: 346 10*3/uL (ref 150–400)
RBC: 4.86 MIL/uL (ref 3.87–5.11)
RDW: 18.3 % — ABNORMAL HIGH (ref 11.5–15.5)
WBC: 8.9 10*3/uL (ref 4.0–10.5)
nRBC: 0 % (ref 0.0–0.2)

## 2018-10-12 LAB — COMPREHENSIVE METABOLIC PANEL
ALT: 20 U/L (ref 0–44)
AST: 23 U/L (ref 15–41)
Albumin: 3.7 g/dL (ref 3.5–5.0)
Alkaline Phosphatase: 92 U/L (ref 38–126)
Anion gap: 12 (ref 5–15)
BUN: 30 mg/dL — ABNORMAL HIGH (ref 8–23)
CO2: 25 mmol/L (ref 22–32)
Calcium: 9.5 mg/dL (ref 8.9–10.3)
Chloride: 105 mmol/L (ref 98–111)
Creatinine, Ser: 0.56 mg/dL (ref 0.44–1.00)
GFR calc Af Amer: >60 mL/min (ref >60)
GFR calc non Af Amer: >60 mL/min (ref >60)
Glucose, Bld: 126 mg/dL — ABNORMAL HIGH (ref 70–99)
Potassium: 3.9 mmol/L (ref 3.5–5.1)
Sodium: 142 mmol/L (ref 135–145)
Total Bilirubin: 0.4 mg/dL (ref 0.3–1.2)
Total Protein: 6.8 g/dL (ref 6.5–8.1)

## 2018-10-12 LAB — SEDIMENTATION RATE: Sed Rate: 20 mm/hr (ref 0–22)

## 2018-10-12 LAB — CK: Total CK: 45 U/L (ref 38–234)

## 2018-10-13 ENCOUNTER — Telehealth: Payer: Self-pay | Admitting: Internal Medicine

## 2018-10-13 NOTE — Telephone Encounter (Signed)
I called Beverly Li today to see how she is doing.  Her constipation finally resolved after taking MiraLAX for several days last week.  Her back pain continues to improve slowly.  She says that she has taken hydrocodone on 2-3 occasions in the past week to help her sleep at night.  I reminded her that this can precipitate recurrent constipation.  She does feel like she has been able to be more active.  Her appetite remains poor but her husband [name] is encouraging her to eat more.  A friend brought her past which she had for several days recently.  She does not think that she has lost any more weight.  She has not had any problems tolerating her pick or antibiotics.  Her sed rate yesterday was normal at 20.

## 2018-10-19 ENCOUNTER — Other Ambulatory Visit (HOSPITAL_COMMUNITY)
Admission: RE | Admit: 2018-10-19 | Discharge: 2018-10-19 | Disposition: A | Discharge status: Home / Self Care | Payer: Medicare Other | Source: Other Acute Inpatient Hospital | Attending: Internal Medicine | Admitting: Internal Medicine

## 2018-10-19 DIAGNOSIS — R7881 Bacteremia: Secondary | ICD-10-CM | POA: Insufficient documentation

## 2018-10-19 DIAGNOSIS — M4640 Discitis, unspecified, site unspecified: Secondary | ICD-10-CM | POA: Insufficient documentation

## 2018-10-19 LAB — CBC WITH DIFFERENTIAL/PLATELET
Abs Immature Granulocytes: 0.02 10*3/uL (ref 0.00–0.07)
Basophils Absolute: 0.1 10*3/uL (ref 0.0–0.1)
Basophils Relative: 1 %
Eosinophils Absolute: 0.1 10*3/uL (ref 0.0–0.5)
Eosinophils Relative: 2 %
HCT: 46.8 % — ABNORMAL HIGH (ref 36.0–46.0)
Hemoglobin: 14.4 g/dL (ref 12.0–15.0)
Immature Granulocytes: 0 %
Lymphocytes Relative: 26 %
Lymphs Abs: 2.2 10*3/uL (ref 0.7–4.0)
MCH: 30.3 pg (ref 26.0–34.0)
MCHC: 30.8 g/dL (ref 30.0–36.0)
MCV: 98.5 fL (ref 80.0–100.0)
Monocytes Absolute: 0.6 10*3/uL (ref 0.1–1.0)
Monocytes Relative: 7 %
Neutro Abs: 5.4 10*3/uL (ref 1.7–7.7)
Neutrophils Relative %: 64 %
Platelets: 320 10*3/uL (ref 150–400)
RBC: 4.75 MIL/uL (ref 3.87–5.11)
RDW: 18.3 % — ABNORMAL HIGH (ref 11.5–15.5)
WBC: 8.3 10*3/uL (ref 4.0–10.5)
nRBC: 0 % (ref 0.0–0.2)

## 2018-10-19 LAB — COMPREHENSIVE METABOLIC PANEL
ALT: 17 U/L (ref 0–44)
AST: 26 U/L (ref 15–41)
Albumin: 3.9 g/dL (ref 3.5–5.0)
Alkaline Phosphatase: 92 U/L (ref 38–126)
Anion gap: 10 (ref 5–15)
BUN: 23 mg/dL (ref 8–23)
CO2: 27 mmol/L (ref 22–32)
Calcium: 9.3 mg/dL (ref 8.9–10.3)
Chloride: 104 mmol/L (ref 98–111)
Creatinine, Ser: 0.46 mg/dL (ref 0.44–1.00)
GFR calc Af Amer: >60 mL/min (ref >60)
GFR calc non Af Amer: >60 mL/min (ref >60)
Glucose, Bld: 76 mg/dL (ref 70–99)
Potassium: 3.6 mmol/L (ref 3.5–5.1)
Sodium: 141 mmol/L (ref 135–145)
Total Bilirubin: 0.6 mg/dL (ref 0.3–1.2)
Total Protein: 7.1 g/dL (ref 6.5–8.1)

## 2018-10-19 LAB — SEDIMENTATION RATE: Sed Rate: 20 mm/hr (ref 0–22)

## 2018-10-19 LAB — CK: Total CK: 53 U/L (ref 38–234)

## 2018-10-26 ENCOUNTER — Other Ambulatory Visit (HOSPITAL_COMMUNITY)
Admission: RE | Admit: 2018-10-26 | Discharge: 2018-10-26 | Disposition: A | Discharge status: Home / Self Care | Payer: Medicare Other | Source: Other Acute Inpatient Hospital | Attending: Internal Medicine | Admitting: Internal Medicine

## 2018-10-26 DIAGNOSIS — M4640 Discitis, unspecified, site unspecified: Secondary | ICD-10-CM | POA: Insufficient documentation

## 2018-10-26 DIAGNOSIS — R7881 Bacteremia: Secondary | ICD-10-CM | POA: Diagnosis present

## 2018-10-26 LAB — COMPREHENSIVE METABOLIC PANEL
ALT: 19 U/L (ref 0–44)
AST: 23 U/L (ref 15–41)
Albumin: 3.8 g/dL (ref 3.5–5.0)
Alkaline Phosphatase: 87 U/L (ref 38–126)
Anion gap: 13 (ref 5–15)
BUN: 33 mg/dL — ABNORMAL HIGH (ref 8–23)
CO2: 27 mmol/L (ref 22–32)
Calcium: 9.4 mg/dL (ref 8.9–10.3)
Chloride: 103 mmol/L (ref 98–111)
Creatinine, Ser: 0.63 mg/dL (ref 0.44–1.00)
GFR calc Af Amer: >60 mL/min (ref >60)
GFR calc non Af Amer: >60 mL/min (ref >60)
Glucose, Bld: 116 mg/dL — ABNORMAL HIGH (ref 70–99)
Potassium: 4 mmol/L (ref 3.5–5.1)
Sodium: 143 mmol/L (ref 135–145)
Total Bilirubin: 0.3 mg/dL (ref 0.3–1.2)
Total Protein: 6.7 g/dL (ref 6.5–8.1)

## 2018-10-26 LAB — CBC WITH DIFFERENTIAL/PLATELET
Abs Immature Granulocytes: 0.02 10*3/uL (ref 0.00–0.07)
Basophils Absolute: 0 10*3/uL (ref 0.0–0.1)
Basophils Relative: 0 %
Eosinophils Absolute: 0.1 10*3/uL (ref 0.0–0.5)
Eosinophils Relative: 1 %
HCT: 47 % — ABNORMAL HIGH (ref 36.0–46.0)
Hemoglobin: 14 g/dL (ref 12.0–15.0)
Immature Granulocytes: 0 %
Lymphocytes Relative: 25 %
Lymphs Abs: 1.9 10*3/uL (ref 0.7–4.0)
MCH: 29.9 pg (ref 26.0–34.0)
MCHC: 29.8 g/dL — ABNORMAL LOW (ref 30.0–36.0)
MCV: 100.2 fL — ABNORMAL HIGH (ref 80.0–100.0)
Monocytes Absolute: 0.5 10*3/uL (ref 0.1–1.0)
Monocytes Relative: 7 %
Neutro Abs: 5.1 10*3/uL (ref 1.7–7.7)
Neutrophils Relative %: 67 %
Platelets: 326 10*3/uL (ref 150–400)
RBC: 4.69 MIL/uL (ref 3.87–5.11)
RDW: 17.8 % — ABNORMAL HIGH (ref 11.5–15.5)
WBC: 7.6 10*3/uL (ref 4.0–10.5)
nRBC: 0 % (ref 0.0–0.2)

## 2018-10-26 LAB — SEDIMENTATION RATE: Sed Rate: 21 mm/hr (ref 0–22)

## 2018-10-26 LAB — C-REACTIVE PROTEIN: CRP: 1.6 mg/dL — ABNORMAL HIGH (ref <1.0)

## 2018-10-26 LAB — CK: Total CK: 52 U/L (ref 38–234)

## 2018-11-02 ENCOUNTER — Other Ambulatory Visit: Payer: Self-pay

## 2018-11-02 ENCOUNTER — Ambulatory Visit: Payer: Medicare Other | Admitting: Internal Medicine

## 2018-11-02 DIAGNOSIS — M4644 Discitis, unspecified, thoracic region: Secondary | ICD-10-CM

## 2018-11-02 MED ORDER — CEFUROXIME AXETIL 250 MG PO TABS: 250.0000 mg | ORAL_TABLET | Freq: Two times a day (BID) | ORAL | 0 refills | Status: DC

## 2018-11-02 MED ORDER — DOXYCYCLINE HYCLATE 100 MG PO TABS: 100.0000 mg | ORAL_TABLET | Freq: Two times a day (BID) | ORAL | 0 refills | Status: DC

## 2018-11-02 NOTE — Assessment & Plan Note (Signed)
She has made significant improvement.  I told her again that the only true test of cure is to eventually stop all antibiotics and see how she does.  I do not feel that she needs to stay on IV antibiotics but she favors continuing oral antibiotics a little longer.  Of the PICC removed and change her to oral doxycycline and cefuroxime for 2 more weeks.  She will follow-up here in 6 weeks.

## 2018-11-02 NOTE — Progress Notes (Signed)
Per Dr. Megan Salon pull line. LPN called [facility] and gave verbal orders to [facility]. Verbal orders read back and understood. Eugenia Mcalpine, LPN

## 2018-11-02 NOTE — Progress Notes (Signed)
[facility]  Patient Active Problem List   Diagnosis Date Noted  . 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 Unintentional weight loss 10/05/2018    Priority: Medium  . Constipation 10/05/2018    Priority: Medium  . Visit for monitoring Tikosyn therapy 11/18/2017  . Closed nondisplaced fracture of head of radius with routine healing 03/03/2017  . Coronary artery disease involving native coronary artery of native heart without angina pectoris   . Chronic diastolic heart failure (HCC) 03/28/2015  . Paroxysmal atrial fibrillation with rapid ventricular response (HCC)   . Essential hypertension   . S/P femoral-popliteal bypass surgery   . 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  . PAD (peripheral artery disease) (HCC) 03/02/2015  . Claudication (HCC) 01/31/2015  . 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 Hyperlipidemia 01/31/2015  . Atherosclerosis of native arteries of extremity with intermittent claudication (HCC) 02/10/2014  . 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 Atrial arrhythmia   . Mild aortic sclerosis (HCC)     Patient's Medications  New Prescriptions   CEFUROXIME (CEFTIN) 250 MG TABLET    Take 1 tablet (250 mg total) by mouth 2 (two) times daily with a meal.   DOXYCYCLINE (VIBRA-TABS) 100 MG TABLET    Take 1 tablet (100 mg total) by mouth 2 (two) times daily.  Previous Medications   ACETAMINOPHEN (TYLENOL) 500 MG TABLET    Take 1,500 mg by mouth every 6 (six) hours as needed (for pain.).   ALENDRONATE (FOSAMAX) 70 MG TABLET    Take 70 mg by mouth every Monday. Take with a full glass of water on an empty stomach.   ASPIRIN EC 81 MG TABLET    Take 81 mg by mouth daily.   BUPROPION (WELLBUTRIN XL) 150 MG 24 HR TABLET    Take 150 mg by mouth every morning.    BYSTOLIC 2.5 MG TABLET    TAKE 1 TABLET(2.5 MG) BY MOUTH AT BEDTIME   CALCIUM-PHOSPHORUS-VITAMIN D (CALCIUM/D3 ADULT GUMMIES  PO)    Take 2 tablets by mouth daily at 2 PM.   CYCLOBENZAPRINE (FLEXERIL) 10 MG TABLET    Take 0.5 tablets (5 mg total) by mouth 3 (three) times daily as needed for muscle spasms.   DEXILANT 30 MG CAPSULE    Take 30 mg by mouth daily before breakfast.    DILTIAZEM (CARDIZEM) 30 MG TABLET    Take 30 mg by mouth daily as needed (blood pressure higher than 160).    DOFETILIDE (TIKOSYN) 250 MCG CAPSULE    TAKE 1 CAPSULE(250 MCG) BY MOUTH TWICE DAILY   ELIQUIS 5 MG TABS TABLET    TAKE 1 TABLET(5 MG) BY MOUTH TWICE DAILY   EZETIMIBE (ZETIA) 10 MG TABLET    TAKE 1 TABLET(10 MG) BY MOUTH DAILY   HYDRALAZINE (APRESOLINE) 25 MG TABLET    TAKE 1 TABLET BY MOUTH EVERY 8 HOURS AS NEEDED FOR SYSTOLIC BLOOD PRESSURE(TOP NUMBER)>160   HYDROCODONE-ACETAMINOPHEN (NORCO/VICODIN) 5-325 MG TABLET    TAKE 1 TABLET EVERY 6 HOURS AS NEEDED FOR SEVERE BACK PAIN. DO NOT TAKE WITH TYLENOL   MIDODRINE (PROAMATINE) 10 MG TABLET    Take 10 mg by mouth 3 (three) times daily with meals as needed (for systolic blood pressure lower than 100).    MULTIPLE VITAMINS-IRON (STRESS B COMPLEX/IRON) TABS    Take 1 tablet by mouth at bedtime.    ONDANSETRON (ZOFRAN) 4 MG TABLET  Take 1 tablet (4 mg total) by mouth every 8 (eight) hours as needed for nausea or vomiting.   POTASSIUM CHLORIDE (K-DUR,KLOR-CON) 10 MEQ TABLET    TAKE 1 TABLET(10 MEQ) BY MOUTH DAILY   PROAIR HFA 108 (90 BASE) MCG/ACT INHALER    Inhale 2 puffs into the lungs 4 (four) times daily as needed (for wheezing/shortness of breath.).    ROSUVASTATIN (CRESTOR) 5 MG TABLET    Take 0.5 tablets (2.5 mg total) by mouth daily.   ZOLPIDEM (AMBIEN) 5 MG TABLET    Take 5 mg by mouth at bedtime.   Modified Medications   No medications on file  Discontinued Medications   CEFTRIAXONE (ROCEPHIN) 1 G INJECTION    Inject 2 g into the muscle daily.   DAPTOMYCIN 406.5 MG IN SODIUM CHLORIDE 0.9 % 100 ML    Inject 406.5 mg into the vein daily at 8 pm.    Subjective: Tharon Aquas is in  with her husband [name] for her routine follow-up.  She has now completed 60 days of IV antibiotic therapy for thoracic discitis and early osteomyelitis.  She has been on her current regimen daptomycin and ceftriaxone for the last 50 days.  She has had not had any problems tolerating her PICC.  Her constipation resolved several days after her last visit and has not recurred.  She still notes loss of appetite, early satiety and occasional postprandial nausea without vomiting.  Her back pain is much improved over the past 2 months.  She says that she has virtually no back pain but still has some mild discomfort that wraps around her chest.  She is starting to resume her usual activities.  She takes acetaminophen several times each week but otherwise is not requiring any pain medication.  Review of Systems: Review of Systems  Constitutional: Negative for chills, diaphoresis and fever.  Cardiovascular: Negative for chest pain.  Musculoskeletal: Positive for back pain.    Past Medical History:  Diagnosis Date  . Anxiety   . Arthritis    "some in my lower back; probably elbows, knees" (11/18/2017)  . Atrial fibrillation ([place])   . Bell's palsy    when pt. was 73 yrs old, when under stress the left side of face will droop.  . Complication of anesthesia    "vascular OR 2016; BP bottomed out; couldn't get it regulated; ended up in ICU for DAYS" (11/18/2017)  . GERD (gastroesophageal reflux disease)   . Heart murmur   . History of kidney stones   . Hypertension   . Hypertrophic cardiomyopathy (HCC)    severe LV basilar hypertrophy witn no evidence of significant outflow tract obstruction, EF 65-70%, mild LAE, mild TR, grade 1a diastolic dysfunction 7/42/59 (Dr. Candee Furbish) (Atrial Septal Hypertrophy pattern)-- Intra-op TEE with dsignificant outflow tract obstruction - AI, MR & TR  . Insomnia   . Mild aortic sclerosis ([place])   . Osteopenia   . Peripheral vascular disease ([place])   . Syncope    , Vagal     Social History   Tobacco Use  . Smoking status: Former Smoker    Packs/day: 1.00    Years: 50.00    Pack years: 50.00    Types: Cigarettes    Quit date: 12/15/2014    Years since quitting: 3.8  . Smokeless tobacco: Never Used  Substance Use Topics  . Alcohol use: Yes    Comment: 11/18/2017 "might have a couple glasses of wine/month; if that"  . Drug use: Never  Family History  Problem Relation Age of Onset  . Liver cancer Mother   . Cancer Mother        Liver  . Hypertension Mother   . Lung cancer Father   . Cancer Father        Lung  . Breast cancer Sister   . Cancer Sister        Breast    Allergies  Allergen Reactions  . Amoxicillin Other (See Comments)    UTI Has patient had a PCN reaction causing immediate rash, facial/tongue/throat swelling, SOB or lightheadedness with hypotension: No Has patient had a PCN reaction causing severe rash involving mucus membranes or skin necrosis: No Has patient had a PCN reaction that required hospitalization: No Has patient had a PCN reaction occurring within the last 10 years: Yes--UTI ONLY If all of the above answers are "NO", then may proceed with Cephalosporin use.   . Atenolol Cough  . Crestor [Rosuvastatin Calcium] Other (See Comments)    Muscle aches  . Pravastatin Other (See Comments)    Muscle aches  . Sulfa Antibiotics Nausea Only  . Codeine     hallucinations    Objective: Vitals:   11/02/18 1545  BP: (!) 182/101  Pulse: 69  Temp: 98 F (36.7 C)  Weight: 104 lb (47.2 kg)  Height: 5' (1.524 m)   Body mass index is 20.31 kg/m.  Physical Exam Constitutional:      Comments: She appears to be much more comfortable and in better spirits today.  She has gained 1 pound since her last visit but still remains 22 pounds down since the start of her illness.  Musculoskeletal:     Comments: She has no kyphotic deformity of the spine.     Lab Results 10/26/2018 Sedimentation rate 21 C-reactive protein 1.6    Problem List Items Addressed This Visit      High   Thoracic discitis    She has made significant improvement.  I told her again that the only true test of cure is to eventually stop all antibiotics and see how she does.  I do not feel that she needs to stay on IV antibiotics but she favors continuing oral antibiotics a little longer.  Of the PICC removed and change her to oral doxycycline and cefuroxime for 2 more weeks.  She will follow-up here in 6 weeks.      Relevant Medications   doxycycline (VIBRA-TABS) 100 MG tablet   cefUROXime (CEFTIN) 250 MG tablet       Cliffton AstersJohn Epic Tribbett, MD [facility] [phone] pager   [phone] cell 11/02/2018, 4:26 PM

## 2018-11-03 ENCOUNTER — Ambulatory Visit: Payer: Medicare Other | Admitting: Internal Medicine

## 2018-12-12 ENCOUNTER — Other Ambulatory Visit: Payer: Self-pay | Admitting: Cardiology

## 2018-12-14 NOTE — Telephone Encounter (Signed)
89f 47.2kg Scr 0.63 10/26/18 Lovw/skains 06/18/18

## 2018-12-16 ENCOUNTER — Ambulatory Visit: Payer: Medicare Other | Admitting: Internal Medicine

## 2018-12-16 ENCOUNTER — Other Ambulatory Visit: Payer: Self-pay

## 2018-12-16 ENCOUNTER — Encounter: Payer: Self-pay | Admitting: Internal Medicine

## 2018-12-16 VITALS — BP 192/88 | HR 56 | Temp 97.3°F

## 2018-12-16 DIAGNOSIS — U071 COVID-19: Secondary | ICD-10-CM | POA: Diagnosis not present

## 2018-12-16 DIAGNOSIS — R05 Cough: Secondary | ICD-10-CM

## 2018-12-16 DIAGNOSIS — J988 Other specified respiratory disorders: Secondary | ICD-10-CM

## 2018-12-16 DIAGNOSIS — M4644 Discitis, unspecified, thoracic region: Secondary | ICD-10-CM

## 2018-12-16 DIAGNOSIS — R0609 Other forms of dyspnea: Secondary | ICD-10-CM | POA: Diagnosis not present

## 2018-12-16 DIAGNOSIS — R059 Cough, unspecified: Secondary | ICD-10-CM | POA: Insufficient documentation

## 2018-12-16 DIAGNOSIS — J449 Chronic obstructive pulmonary disease, unspecified: Secondary | ICD-10-CM | POA: Insufficient documentation

## 2018-12-16 DIAGNOSIS — R634 Abnormal weight loss: Secondary | ICD-10-CM

## 2018-12-16 NOTE — Assessment & Plan Note (Signed)
She is low risk for COVID-19 but I will have her tested anyway to be more certain.

## 2018-12-16 NOTE — Assessment & Plan Note (Signed)
She is back gaining her lost weight after stopping antibiotics.

## 2018-12-16 NOTE — Assessment & Plan Note (Signed)
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  She will follow-up here in 4 weeks.

## 2018-12-16 NOTE — Progress Notes (Signed)
[facility]  Patient Active Problem List   Diagnosis Date Noted  . DOE (dyspnea on exertion) 12/16/2018    Priority: High  . Cough 12/16/2018    Priority: High  . 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 Unintentional weight loss 10/05/2018    Priority: Medium  . Visit for monitoring Tikosyn therapy 11/18/2017  . Closed nondisplaced fracture of head of radius with routine healing 03/03/2017  . Coronary artery disease involving native coronary artery of native heart without angina pectoris   . Chronic diastolic heart failure (HCC) 03/28/2015  . Paroxysmal atrial fibrillation with rapid ventricular response (HCC)   . Essential hypertension   . S/P femoral-popliteal bypass surgery   . 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  . PAD (peripheral artery disease) (HCC) 03/02/2015  . Claudication (HCC) 01/31/2015  . 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 Hyperlipidemia 01/31/2015  . Atherosclerosis of native arteries of extremity with intermittent claudication (HCC) 02/10/2014  . 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 Atrial arrhythmia   . Mild aortic sclerosis (HCC)     Patient's Medications  New Prescriptions   No medications on file  Previous Medications   ACETAMINOPHEN (TYLENOL) 500 MG TABLET    Take 1,500 mg by mouth every 6 (six) hours as needed (for pain.).   ALENDRONATE (FOSAMAX) 70 MG TABLET    Take 70 mg by mouth every Monday. Take with a full glass of water on an empty stomach.   ASPIRIN EC 81 MG TABLET    Take 81 mg by mouth daily.   BUPROPION (WELLBUTRIN XL) 150 MG 24 HR TABLET    Take 150 mg by mouth every morning.    BYSTOLIC 2.5 MG TABLET    TAKE 1 TABLET(2.5 MG) BY MOUTH AT BEDTIME   CALCIUM-PHOSPHORUS-VITAMIN D (CALCIUM/D3 ADULT GUMMIES PO)    Take 2 tablets by mouth daily at 2 PM.   CYCLOBENZAPRINE (FLEXERIL) 10 MG TABLET    Take 0.5 tablets (5 mg total) by mouth 3  (three) times daily as needed for muscle spasms.   DEXILANT 30 MG CAPSULE    Take 30 mg by mouth daily before breakfast.    DILTIAZEM (CARDIZEM) 30 MG TABLET    Take 30 mg by mouth daily as needed (blood pressure higher than 160).    DOFETILIDE (TIKOSYN) 250 MCG CAPSULE    TAKE 1 CAPSULE(250 MCG) BY MOUTH TWICE DAILY   ELIQUIS 5 MG TABS TABLET    TAKE 1 TABLET(5 MG) BY MOUTH TWICE DAILY   EZETIMIBE (ZETIA) 10 MG TABLET    TAKE 1 TABLET(10 MG) BY MOUTH DAILY   HYDRALAZINE (APRESOLINE) 25 MG TABLET    TAKE 1 TABLET BY MOUTH EVERY 8 HOURS AS NEEDED FOR SYSTOLIC BLOOD PRESSURE(TOP NUMBER)>160   HYDROCODONE-ACETAMINOPHEN (NORCO/VICODIN) 5-325 MG TABLET    TAKE 1 TABLET EVERY 6 HOURS AS NEEDED FOR SEVERE BACK PAIN. DO NOT TAKE WITH TYLENOL   MIDODRINE (PROAMATINE) 10 MG TABLET    Take 10 mg by mouth 3 (three) times daily with meals as needed (for systolic blood pressure lower than 100).    MULTIPLE VITAMINS-IRON (STRESS B COMPLEX/IRON) TABS    Take 1 tablet by mouth at bedtime.    ONDANSETRON (ZOFRAN) 4 MG TABLET    Take 1 tablet (4 mg total) by mouth every 8 (eight) hours as needed for nausea or vomiting.   POTASSIUM CHLORIDE (K-DUR,KLOR-CON) 10 MEQ TABLET  TAKE 1 TABLET(10 MEQ) BY MOUTH DAILY   PROAIR HFA 108 (90 BASE) MCG/ACT INHALER    Inhale 2 puffs into the lungs 4 (four) times daily as needed (for wheezing/shortness of breath.).    ROSUVASTATIN (CRESTOR) 5 MG TABLET    Take 0.5 tablets (2.5 mg total) by mouth daily.   ZOLPIDEM (AMBIEN) 5 MG TABLET    Take 5 mg by mouth at bedtime.   Modified Medications   No medications on file  Discontinued Medications   CEFUROXIME (CEFTIN) 250 MG TABLET    Take 1 tablet (250 mg total) by mouth 2 (two) times daily with a meal.   DOXYCYCLINE (VIBRA-TABS) 100 MG TABLET    Take 1 tablet (100 mg total) by mouth 2 (two) times daily.    Subjective: Beverly Li is in with her husband [name] for her routine follow-up.  She completed 60 days of IV antibiotic therapy for  thoracic discitis and early osteomyelitis on 11/02/2018.  She transition to oral doxycycline and cefuroxime and took that for 2 more weeks, completing therapy on 11/16/2018.  She says that she continues to improve.  She has some stiffness in her upper back particularly when she is standing up and preparing meals at the counter but she is not having any pain.  She is not requiring any pain medication.  Her appetite has improved and she has been able to gain 4 pounds.  She is not sleeping well and is very fatigued during the day.  Over the past 3 weeks she has developed new dyspnea on exertion and a loose cough.  She and bill have been socially distancing during the [place] pandemic and have been in contact with very few friends or family.  She has not had any fever, chills or sweats.  She has had no sore throat.  She has had no change in her sense of smell.   Review of Systems: Review of Systems  Constitutional: Negative for chills, diaphoresis and fever.  HENT: Positive for congestion. Negative for sore throat.   Respiratory: Positive for cough, sputum production and shortness of breath.   Cardiovascular: Negative for chest pain.  Gastrointestinal: Negative for abdominal pain, constipation, diarrhea, nausea and vomiting.  Musculoskeletal: Negative for back pain.    Past Medical History:  Diagnosis Date  . Anxiety   . Arthritis    "some in my lower back; probably elbows, knees" (11/18/2017)  . Atrial fibrillation ([place])   . Bell's palsy    when pt. was 73 yrs old, when under stress the left side of face will droop.  . Complication of anesthesia    "vascular OR 2016; BP bottomed out; couldn't get it regulated; ended up in ICU for DAYS" (11/18/2017)  . GERD (gastroesophageal reflux disease)   . Heart murmur   . History of kidney stones   . Hypertension   . Hypertrophic cardiomyopathy (HCC)    severe LV basilar hypertrophy witn no evidence of significant outflow tract obstruction, EF 65-70%, mild  LAE, mild TR, grade 1a diastolic dysfunction 12/27/76 (Dr. Candee Furbish) (Atrial Septal Hypertrophy pattern)-- Intra-op TEE with dsignificant outflow tract obstruction - AI, MR & TR  . Insomnia   . Mild aortic sclerosis ([place])   . Osteopenia   . Peripheral vascular disease ([place])   . Syncope    , Vagal    Social History   Tobacco Use  . Smoking status: Former Smoker    Packs/day: 1.00    Years: 50.00    Pack years:  50.00    Types: Cigarettes    Quit date: 12/15/2014    Years since quitting: 4.0  . Smokeless tobacco: Never Used  Substance Use Topics  . Alcohol use: Yes    Comment: 11/18/2017 "might have a couple glasses of wine/month; if that"  . Drug use: Never    Family History  Problem Relation Age of Onset  . Liver cancer Mother   . Cancer Mother        Liver  . Hypertension Mother   . Lung cancer Father   . Cancer Father        Lung  . Breast cancer Sister   . Cancer Sister        Breast    Allergies  Allergen Reactions  . Amoxicillin Other (See Comments)    UTI Has patient had a PCN reaction causing immediate rash, facial/tongue/throat swelling, SOB or lightheadedness with hypotension: No Has patient had a PCN reaction causing severe rash involving mucus membranes or skin necrosis: No Has patient had a PCN reaction that required hospitalization: No Has patient had a PCN reaction occurring within the last 10 years: Yes--UTI ONLY If all of the above answers are "NO", then may proceed with Cephalosporin use.   . Atenolol Cough  . Crestor [Rosuvastatin Calcium] Other (See Comments)    Muscle aches  . Pravastatin Other (See Comments)    Muscle aches  . Sulfa Antibiotics Nausea Only  . Codeine     hallucinations    Objective: Vitals:   12/16/18 0959 12/16/18 1022  BP: (!) 192/88   Pulse: (!) 56   Temp: (!) 97.3 F (36.3 C)   SpO2: 91% (!) 89%   There is no height or weight on file to calculate BMI.  Physical Exam Constitutional:      Comments: She is in  good spirits.  Cardiovascular:     Rate and Rhythm: Normal rate and regular rhythm.     Heart sounds: No murmur.  Pulmonary:     Effort: Pulmonary effort is normal.     Breath sounds: No wheezing, rhonchi or rales.     Comments: Her room air O2 saturation was 91% and fell to 88% when walking around the clinic. Musculoskeletal:     Comments: She has no kyphotic deformity of the spine.  Psychiatric:        Mood and Affect: Mood normal.     Lab Results 10/26/2018 Sedimentation rate 21 C-reactive protein 1.6   Problem List Items Addressed This Visit      High   Thoracic discitis    I am very hopeful that her thoracic spine infection has been cured.  She will follow-up here in 4 weeks.      DOE (dyspnea on exertion)    She is low risk for COVID-19 but I will have her tested anyway to be more certain.      Relevant Orders   SARS-COV-2 RNA,(COVID-19) QUAL NAAT   Cough   Relevant Orders   SARS-COV-2 RNA,(COVID-19) QUAL NAAT     Medium   Unintentional weight loss    She is back gaining her lost weight after stopping antibiotics.       Other Visit Diagnoses    COVID-19    -  Primary   Relevant Orders   SARS-COV-2 RNA,(COVID-19) QUAL NAAT       Cliffton Asters, MD [facility] [phone] pager   [phone] cell 12/16/2018, 10:58 AM

## 2018-12-17 ENCOUNTER — Telehealth: Payer: Self-pay | Admitting: Cardiology

## 2018-12-17 DIAGNOSIS — I421 Obstructive hypertrophic cardiomyopathy: Secondary | ICD-10-CM

## 2018-12-17 DIAGNOSIS — R0602 Shortness of breath: Secondary | ICD-10-CM

## 2018-12-17 NOTE — Telephone Encounter (Signed)
° ° °  Pt c/o Shortness Of Breath: STAT if SOB developed within the last 24 hours or pt is noticeably SOB on the phone  1. Are you currently SOB (can you hear that pt is SOB on the phone)? no  2. How long have you been experiencing SOB?  weeks  3. Are you SOB when sitting or when up moving around? Moving around  4. Are you currently experiencing any other symptoms? no

## 2018-12-17 NOTE — Telephone Encounter (Signed)
Spoke with patient who is calling at the advice of Dr Megan Salon whom she saw yesterday, to make sure nothing is going on with her heart .  Pt is experiencing increased SOB that has developed over time but is worsening.  She has had an unintentional wt loss of more than 20 lbs (per her report) since being treated for 60 days with IV antibiotics then oral doxy and cefuroxime for thoracic discitis.  She has only been able to gain 4 lbs back at this point.  She reports a very "bad sounding" cough though Dr Megan Salon heard clear lung sound yesterday.  She denies any edema or swelling.  She has had no fever, chills or contact with anyone with known or suspected Covid-19.  She was tested yesterday however those results are pending.  She remains very fatigued. The only thing helpful with her SOB is her Proair HFA inhaler but seems to be beneficial for a short while.  She is taking her medications as listed.  Advised to use her incentive spirometer (from prior hospitalizations) several times a day to help rebuild respiratory muscles.  Advised I will review with Dr Marlou Porch and call back with further instructions/orders.  She has been scheduled to see Dr Marlou Porch in the office 9/14.

## 2018-12-17 NOTE — Telephone Encounter (Signed)
Thanks for update. Agree with plan for proAir With no edema and significant weight loss, I am hesitant to use any lasix (especially if beta agonist inhaler seems to help)   Just to be sure that there is no change in cardiac function during this infection, let's check an ECHO - dyspnea, HOCM.   I would have her report her findings to Dr. Radene Ou. May need chest XRAY?  Thanks  Candee Furbish, MD

## 2018-12-17 NOTE — Telephone Encounter (Signed)
Reviewed recommendations and orders with pt who states understanding.  She is aware someone will call her to be scheduled for the 2 D Echo.  She had no further questions or concerns at this time.  She will contact Dr Radene Ou to see if she needs a chest xray.

## 2018-12-19 LAB — SARS-COV-2 RNA,(COVID-19) QUALITATIVE NAAT: SARS CoV2 RNA: NOT DETECTED

## 2018-12-28 ENCOUNTER — Ambulatory Visit (INDEPENDENT_AMBULATORY_CARE_PROVIDER_SITE_OTHER): Payer: Medicare Other | Admitting: Cardiology

## 2018-12-28 ENCOUNTER — Other Ambulatory Visit: Payer: Self-pay

## 2018-12-28 ENCOUNTER — Encounter: Payer: Self-pay | Admitting: Cardiology

## 2018-12-28 ENCOUNTER — Telehealth: Payer: Self-pay

## 2018-12-28 VITALS — BP 160/70 | HR 58 | Ht 60.0 in | Wt 105.4 lb

## 2018-12-28 DIAGNOSIS — Z87891 Personal history of nicotine dependence: Secondary | ICD-10-CM

## 2018-12-28 DIAGNOSIS — I959 Hypotension, unspecified: Secondary | ICD-10-CM | POA: Diagnosis not present

## 2018-12-28 DIAGNOSIS — I421 Obstructive hypertrophic cardiomyopathy: Secondary | ICD-10-CM | POA: Diagnosis not present

## 2018-12-28 DIAGNOSIS — R0602 Shortness of breath: Secondary | ICD-10-CM | POA: Diagnosis not present

## 2018-12-28 NOTE — Telephone Encounter (Addendum)
Received call from Dr. Radene Ou nurse Tif wanting covid test results form 9/2 ordered by Dr. Megan Salon. Negative lab results sent via fax at [phone] [facility]

## 2018-12-28 NOTE — Patient Instructions (Signed)
Your physician recommends that you continue on your current medications as directed. Please refer to the Current Medication list given to you today.   Your physician wants you to follow-up in:6 MONTHS WITH DR SKAINS  You will receive a reminder letter in the mail two months in advance. If you don't receive a letter, please call our office to schedule the follow-up appointment.  

## 2018-12-28 NOTE — Progress Notes (Signed)
Cardiology Office Note   Date:  12/28/2018   ID:  Beverly Li, DOB 1946-02-01, MRN [id number]  PCP:  Clayborn Heronankins, Victoria R, MD  Cardiologist:   Donato SchultzMark Alexandrina Fiorini, MD / Graciela HusbandsKlein       History of Present Illness: Beverly Li is a 73 y.o. female  with hypertrophic obstructive cardiomyopathy, recent severe exacerbation again post cardiac catheterization and prior during femoropopliteal bypass resulting in  phenylephrine , fluids and prolonged intubation here for follow-up.  She underwent diagnostic heart catheterization on 05/07/16 and post procedure developed severe hypotension, hyper vagal response. Requiring once again phenylephrine, facemask oxygen. She did not require intubation. 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 Hydralazine was given post catheterization wound systolic pressure pressure was 190. Profound hypotension and bradycardia noted. This was treated rapidly, fluids, phenylephrine, heart rates as low as the 40s.  Midodrine started. Phenylephrine eventually weaned off. Diuresis noted. Klonopin was started for anxiety, to hopefully help blunt the hyper adrenergic response. She denies being atypically anxious person however.  Echocardiogram 04/2016 showed EF 65-70% with hypertrophic obstructive cardiomyopathy with LVOT gradient of 23 mm at rest.  She is here for follow-up today and should have appointment set up with Dr. Graciela HusbandsKlein to discuss her profound autonomic dysfunction.  In 2014 started having "heart burn" episodes. Debilitating. Out walking dog. By time she would get back, sweat, diahpesis, up neck, nausea then diarrhea. Wiped out for a few hours. Does not feel it now with exercise. Felt 3 hours irreg hb afib kicked in. Afaid to go anywhere. The sweat comes over her hot. Pain. After she described this once again, this is why I proceeded with cardiac catheterization to make sure that with her peripheral vascular disease that she did not have severe  coronary artery disease that was now becoming progressive. Thankfully, she did not have flow limiting disease. Moderate LAD, FFR.  Shortly after cardiac catheterization when her blood pressure increased to the 190 systolic range, she was given hydralazine to reduce her blood pressure and she ended up having a hyper vagal response where her heart rate decreases the 40s, blood pressure plummeted into the 50 systolic, she developed flash pulmonary edema. She was placed in the CCU, phenylephrine. Slowly, this was able to be weaned. Klonopin and Midodrin was started.  This episode post catheterization was very similar to her episode intraoperatively during her femoropopliteal bypass. She was intubated for several days after that procedure and with her arterial line demonstrated classic spike and dome changes intermittently with marked variations in her blood pressure at various points.  09/16/16-since her last visit she has had cardiac MRI and stress echocardiogram. Both were reassuring. She did not drop her blood pressures during exercise. She did not increase her gradient significantly during exercise. We were able to invoke an increase left ventricular output tract gradient with Valsalva maneuver. Please see report. No ventricular arrhythmias. She states that over the last 3 months she's felt the best that she's felt in quite some time. She is drinking sports drinks quite frequently, propel. When she does have low blood pressure usually has a prodrome, very vagal-like response as noted previously.  03/31/17-she had a fall in [place] when walking on cobblestones in a garden with flip-flops during a house store with her girlfriends.  She states that she broke her fall with her lips.  She also broke her arm.  Dr. Cleophas DunkerWhitfield.  She has not had any severe cardiac symptoms thankfully.  At  one point during the last several months she did have low blood pressure and she took the Midrin which took effect after  approximately 3 hours.  She has been drinking propel.  Doing very well.  06/26/17 - 2 episodes with crashed BP, out for the day. Midodrine then. 185/110 at home.  She took a hydralazine at home and now her blood pressure is 158/90.  We once again went over how challenging it is for her given her hypertrophic cardiomyopathy and previous severe hypotension.  Currently she is not have any chest pain, no strokelike symptoms.  No fevers chills nausea vomiting.  10/28/2017- cardioversion took place in June 2019 setting of 2-1 atrial flutter with rapid ventricular response for approximately 4 days causing symptoms.  Dr. Shirlee LatchMcLean performed cardioversion with Dr. Jean RosenthalJackson as anesthesiologist.  Dr. Graciela HusbandsKlein saw her in clinic prior to conversion.  Overall she has been doing quite well.  She did have one other episode at the beach lasting approximately 2 or 3 hours at a heart rate that she says was 125 to 130 bpm.  No syncope, no bleeding, no orthopnea, no PND.  12/28/18 -chief complaint: She has been experiencing more shortness of breath.  She dealt with discitis and prolonged IV antibiotic course.  Fatigue.  After long discussion with infectious disease, we did not pursue invasive biopsy because of problems with anesthesia in the past. COVID neg. Rely on albuterol. Helps. No syncope in 6 months. Tikosyn has worked well. Phelem.  Has lost quite a bit of weight during this infection.  Was depressed for a little while.  Starting to come back she states.  Likely, over the last 6 months she is not had any drops in her blood pressure.  Past Medical History:  Diagnosis Date   Anxiety    Arthritis    "some in my lower back; probably elbows, knees" (11/18/2017)   Atrial fibrillation (HCC)    Bell's palsy    when pt. was 73 yrs old, when under stress the left side of face will droop.   Complication of anesthesia    "vascular OR 2016; BP bottomed out; couldn't get it regulated; ended up in ICU for DAYS" (11/18/2017)   GERD  (gastroesophageal reflux disease)    Heart murmur    History of kidney stones    Hypertension    Hypertrophic cardiomyopathy (HCC)    severe LV basilar hypertrophy witn no evidence of significant outflow tract obstruction, EF 65-70%, mild LAE, mild TR, grade 1a diastolic dysfunction 05/15/10 (Dr. Donato SchultzMark Letia Guidry) (Atrial Septal Hypertrophy pattern)-- Intra-op TEE with dsignificant outflow tract obstruction - AI, MR & TR   Insomnia    Mild aortic sclerosis (HCC)    Osteopenia    Peripheral vascular disease (HCC)    Syncope    , Vagal    Past Surgical History:  Procedure Laterality Date   AUGMENTATION MAMMAPLASTY Bilateral    BACK SURGERY     CARDIAC CATHETERIZATION N/A 05/07/2016   Procedure: Left Heart Cath and Coronary Angiography;  Surgeon: Kathleene Hazelhristopher D McAlhany, MD;  [facility];  Service: Cardiovascular;  Laterality: N/A;   CARDIOVERSION N/A 09/24/2017   Procedure: CARDIOVERSION;  Surgeon: Laurey MoraleMcLean, Dalton S, MD;  [facility];  Service: Cardiovascular;  Laterality: N/A;   DILATION AND CURETTAGE OF UTERUS     ENDARTERECTOMY FEMORAL Right 03/02/2015   Procedure: ENDARTERECTOMY RIGHT FEMORAL;  Surgeon: Chuck Hinthristopher S Dickson, MD;  [facility];  Service: Vascular;  Laterality: Right;   FACIAL  COSMETIC SURGERY Left 2002   "related to Bell's Palsy @ age 53; left eye/side of face droopy; tried to make area symmetrical"   FEMORAL-POPLITEAL BYPASS GRAFT Right 03/02/2015   Procedure: BYPASS GRAFT FEMORAL-BELOW KNEE POPLITEAL ARTERY;  Surgeon: Chuck Hint, MD;  Location: [facility] OR;  Service: Vascular;  Laterality: Right;   INGUINAL HERNIA REPAIR Bilateral 2002   OVARIAN CYST REMOVAL Left    PERIPHERAL VASCULAR CATHETERIZATION N/A 01/16/2015   Procedure: Abdominal Aortogram;  Surgeon: Chuck Hint, MD;  Location: [facility] INV[facility] CV LAB;  Service: Cardiovascular;  Laterality: N/A;   POSTERIOR LUMBAR FUSION  2015   "have plates and screws  in there"   TONSILLECTOMY       Current Outpatient Medications  Medication Sig Dispense Refill   acetaminophen (TYLENOL) 500 MG tablet Take 1,500 mg by mouth every 6 (six) hours as needed (for pain.).     alendronate (FOSAMAX) 70 MG tablet Take 70 mg by mouth every Monday. Take with a full glass of water on an empty stomach.     aspirin EC 81 MG tablet Take 81 mg by mouth daily.     buPROPion (WELLBUTRIN XL) 150 MG 24 hr tablet Take 150 mg by mouth every morning.      BYSTOLIC 2.5 MG tablet TAKE 1 ZOXWRU(0.4 MG) BY MOUTH AT BEDTIME 30 tablet 6   Calcium-Phosphorus-Vitamin D (CALCIUM/D3 ADULT GUMMIES PO) Take 2 tablets by mouth daily at 2 PM.     cyclobenzaprine (FLEXERIL) 10 MG tablet Take 0.5 tablets (5 mg total) by mouth 3 (three) times daily as needed for muscle spasms. 60 tablet 2   DEXILANT 30 MG capsule Take 30 mg by mouth daily before breakfast.      diltiazem (CARDIZEM) 30 MG tablet Take 30 mg by mouth daily as needed (blood pressure higher than 160).      dofetilide (TIKOSYN) 250 MCG capsule TAKE 1 CAPSULE(250 MCG) BY MOUTH TWICE DAILY 60 capsule 6   ELIQUIS 5 MG TABS tablet TAKE 1 TABLET(5 MG) BY MOUTH TWICE DAILY 180 tablet 1   ezetimibe (ZETIA) 10 MG tablet TAKE 1 TABLET(10 MG) BY MOUTH DAILY 30 tablet 9   hydrALAZINE (APRESOLINE) 25 MG tablet TAKE 1 TABLET BY MOUTH EVERY 8 HOURS AS NEEDED FOR SYSTOLIC BLOOD PRESSURE(TOP NUMBER)>160 90 tablet 3   HYDROcodone-acetaminophen (NORCO/VICODIN) 5-325 MG tablet TAKE 1 TABLET EVERY 6 HOURS AS NEEDED FOR SEVERE BACK PAIN. DO NOT TAKE WITH TYLENOL 20 tablet 0   midodrine (PROAMATINE) 10 MG tablet Take 10 mg by mouth 3 (three) times daily with meals as needed (for systolic blood pressure lower than 100).  90 tablet 1   Multiple Vitamins-Iron (STRESS B COMPLEX/IRON) TABS Take 1 tablet by mouth at bedtime.      ondansetron (ZOFRAN) 4 MG tablet Take 1 tablet (4 mg total) by mouth every 8 (eight) hours as needed for nausea or  vomiting. 60 tablet 1   potassium chloride (K-DUR,KLOR-CON) 10 MEQ tablet TAKE 1 TABLET(10 MEQ) BY MOUTH DAILY 30 tablet 6   PROAIR HFA 108 (90 Base) MCG/ACT inhaler Inhale 2 puffs into the lungs 4 (four) times daily as needed (for wheezing/shortness of breath.).   1   rosuvastatin (CRESTOR) 5 MG tablet Take 0.5 tablets (2.5 mg total) by mouth daily. 90 tablet 3   zolpidem (AMBIEN) 5 MG tablet Take 5 mg by mouth at bedtime.      No current facility-administered medications for this visit.     Allergies:  Amoxicillin, Atenolol, Crestor [rosuvastatin calcium], Pravastatin, Sulfa antibiotics, and Codeine    Social History:  The patient  reports that she quit smoking about 4 years ago. Her smoking use included cigarettes. She has a 50.00 pack-year smoking history. She has never used smokeless tobacco. She reports current alcohol use. She reports that she does not use drugs.   Family History:  The patient's family history includes Breast cancer in her sister; Cancer in her father, mother, and sister; Hypertension in her mother; Liver cancer in her mother; Lung cancer in her father.    ROS:  Please see the history of present illness.  All others negative.   PHYSICAL EXAM: VS:  BP (!) 160/70    Pulse (!) 58    Ht 5' (1.524 m)    Wt 105 lb 6.4 oz (47.8 kg)    LMP  (LMP Unknown)    SpO2 (!) 88%    BMI 20.58 kg/m  , BMI Body mass index is 20.58 kg/m. GEN: Thin, in no acute distress  HEENT: normal  Neck: no JVD, carotid bruits, or masses Cardiac: RRR; 2/6 SM  rubs, or gallops,no edema no changes Respiratory: Mild wheeze heard in bases bilaterally GI: soft, nontender, nondistended, + BS MS: no deformity or atrophy  Skin: warm and dry, no rash Neuro:  Alert and Oriented x 3, Strength and sensation are intact Psych: euthymic mood, full affect no change   EKG: 01/06/2018-sinus bradycardia 53 04/22/16-sinus bradycardia rate 58 with biatrial enlargement, T-wave inversion noted in 1, aVL, subtle  downsloping noted in V6. Note hypertrophic cardiomyopathy. Prior, sinus brady with T-wave changes same as current. Personally viewed.   Recent Labs: 10/26/2018: ALT 19; BUN 33; Creatinine, Ser 0.63; Hemoglobin 14.0; Platelets 326; Potassium 4.0; Sodium 143     Lipid Panel    Component Value Date/Time   CHOL 211 (H) 09/16/2016 1200   TRIG 78 09/16/2016 1200   HDL 97 09/16/2016 1200   CHOLHDL 2.2 09/16/2016 1200   LDLCALC 98 09/16/2016 1200  LDL last checked 03/19/17 108    Wt Readings from Last 3 Encounters:  12/28/18 105 lb 6.4 oz (47.8 kg)  11/02/18 104 lb (47.2 kg)  10/05/18 103 lb (46.7 kg)      Other studies Reviewed: Additional studies/ records that were reviewed today include:  Hospital records reviewed, echocardiogram reviewed. Review of the above records demonstrates:  As above  ECHO 05/08/26: ------------------------------------------------------------------- Study Conclusions  - Left ventricle: The cavity size was normal. Wall thickness was   increased in a pattern of moderate LVH. There was severe focal   basal hypertrophy of the septum (measures 2.2 cm) consistent with   HOCM - the rest LVOT gradient is 23 mmHg. Systolic function was   vigorous. The estimated ejection fraction was in the range of 65%   to 70%. Wall motion was normal; there were no regional wall   motion abnormalities. Doppler parameters are consistent with   abnormal left ventricular relaxation (grade 1 diastolic   dysfunction). The E/e&' ratio is >25, suggesting markedly elevated   LV Filling pressure. - Aortic valve: Mildly calcified leaflets. Mild stenosis - the peak   aortic valve gradient is 42 mmHg. There was no regurgitation. - Mitral valve: Calcified annulus. SAM is noted. Mildly thickened   leaflets . There was moderate regurgitation. - Left atrium: The atrium was mildly dilated. - Tricuspid valve: There was mild regurgitation. - Pulmonary arteries: PA peak pressure: 29 mm Hg  (S). - Inferior vena cava: The  vessel was normal in size. The   respirophasic diameter changes were in the normal range (>= 50%),   consistent with normal central venous pressure.  Impressions:  - Findings consistent with HOCM - proximal septum measures 2.2 cm,   SAM is noted, the rest LVOT gradient is 23 mmHg - the peak   continuous gradient across the aortic valve is 42 mmHg. The   aortic valve is not well-visualized but appears calcified and is   likely mildly stenotic. LVEF remains 70% or greater. LV filling   pressure is very high. There is at least moderate MR, mostly due   to systolic anterior motion [facility]) of the anterior mitral leaflet.   Valsalva was not performed.   ASSESSMENT AND PLAN:  73 year old female with hypertrophic obstructive cardiomyopathy, moderate coronary artery disease, nonflow limiting LAD lesion by FFR, peripheral vascular disease requiring femoropopliteal bypass, long-standing history of hyper vagal responses.    Hypertrophic obstructive cardiomyopathy/Dyspnea on exertion  -  Avoid dehydration  -  At baseline, outflow tract is not significant  -  Intraoperative. With crossclamping, vasodilatation,   Increased adrenergic drive. Perfect storm to elucidate obstruction.She was intubated for several days postoperatively femoropopliteal and hemodynamically post significant troubles trying to extubate given her periodic outflow tract obstruction which was severe.  -  We will try to avoid all elective surgical procedures in the future.  -  No high-risk symptoms such as ventricular tachycardia, early cardiac death in family members , unexplained syncope , septum less than 3 cm. Murmur has been present at baseline.   -   Bystolic 2.5 QD (decreased heart rate , decreased inotropic beneficial in her situation) given her blood pressure that was highly elevated.  When she has tried higher dosing, she bottoms out too quickly and this causes near syncope.   - She describes  hypervagal-like experiences including nausea, vomiting, pallor, diaphoresis with subsequent diarrhea, feeling wiped out for hours. She has been battling this for years. Continue to avoid dehydration. She is drinking propel. She knows to lay down immediately if she begins to feel this.  - Now on midodrine PRN.  Sometimes she needs to take 2 of them to help.  - Thankfully the stress echocardiogram did not show any drops in blood pressure with exercise. She did not show any increased gradient during exercise on echocardiogram. She did however when performing Valsalva increase her gradient. Please see report. No ventricular arrhythmias.  - MRI did not show any evidence of SAM, minimal gradient.  -Blood pressure remains difficult to control.  I am willing to tolerate elevated blood pressures because her low blood pressure values can be severely reduced, 60/40 for instance.   -Dyspnea-Prior extensive smoking history- PFTs/pulmonary evaluation, Referred to pulmonary previously but I do not see any recent PFTs, she saw Dr. Lamonte Sakai. Chest x-ray PA and lateral from 09/19/12 was interpreted as underlying COPD.  She says the albuterol helps.  Should she be on long-acting inhalers?  Would like for her to revisit pulmonary.    Sinus bradycardia   - She takes hydralazine if her blood pressure gets over 160 occasionally. She takes midodrine if it drops significantly but she has not taken this recently.  Currently her heart rate is 62 bpm on low-dose Bystolic.    Paroxysmal atrial fibrillation  -Had episode requiring cardioversion in June 2019.  Felt sluggish, weak, short of breath.  Successful cardioversion on propofol.  She did not have any hemodynamic instability.  -  Continuing with anticoagulation, Eliquis  -  No longer on amiodarone.  Now on dofetilide.  -  Postoperative,  seems to be maintaining sinus rhythm well.  Anxiety  - would be ok to use low dose Xanax if needed. Dr. Barbaraann Barthel.  This was previously  discussed with Dr. Gala Romney.    Statin intolerance  -We tolerating the low-dose Crestor 2.5 a day  Essential hypertension, labile hypertension -Bystolic 2.5, Cardizem 30 mg as needed for significant hypertension.. Challenging.  Understands stroke risks however understands risks of cardiovascular collapse given her swings.  Disposition:   FU with Shamone Winzer 6 months  Signed, Donato Schultz, MD  12/28/2018 2:03 PM    [facility] [address] [place], [place], [place]  [address] Phone: [phone]; Fax: [phone]

## 2018-12-29 ENCOUNTER — Ambulatory Visit (HOSPITAL_COMMUNITY): Payer: Medicare Other | Attending: Cardiology

## 2018-12-29 DIAGNOSIS — R0602 Shortness of breath: Secondary | ICD-10-CM

## 2018-12-29 DIAGNOSIS — I421 Obstructive hypertrophic cardiomyopathy: Secondary | ICD-10-CM | POA: Diagnosis not present

## 2018-12-29 MED ORDER — PERFLUTREN LIPID MICROSPHERE
1.0000 mL | INTRAVENOUS | Status: AC | PRN
  Administered 2018-12-29: 2 mL via INTRAVENOUS

## 2018-12-30 ENCOUNTER — Telehealth: Payer: Self-pay

## 2018-12-30 NOTE — Telephone Encounter (Signed)
Notes recorded by Frederik Schmidt, RN on 12/30/2018 at 8:29 AM EDT  [id number] 9/16  ------

## 2018-12-30 NOTE — Telephone Encounter (Signed)
-----   Message from Mark C Skains, MD sent at 12/30/2018  8:26 AM EDT ----- Normal EF 65%, normal pump function. No significant LVOT gradient.  No significant change from prior study.  Reassuring.  Mark Skains, MD  

## 2018-12-30 NOTE — Telephone Encounter (Signed)
Notes recorded by Frederik Schmidt, RN on 12/30/2018 at 9:07 AM EDT  The patient has been notified of the Echo result and verbalized understanding. All questions (if any) were answered.  Frederik Schmidt, RN 12/30/2018 9:07 AM

## 2018-12-30 NOTE — Telephone Encounter (Signed)
-----   Message from Jerline Pain, MD sent at 12/30/2018  8:26 AM EDT ----- Normal EF 65%, normal pump function. No significant LVOT gradient.  No significant change from prior study.  Reassuring.  Candee Furbish, MD

## 2019-01-06 ENCOUNTER — Ambulatory Visit (INDEPENDENT_AMBULATORY_CARE_PROVIDER_SITE_OTHER): Payer: Medicare Other | Admitting: Emergency Medicine

## 2019-01-06 ENCOUNTER — Other Ambulatory Visit: Payer: Self-pay

## 2019-01-06 ENCOUNTER — Encounter: Payer: Self-pay | Admitting: Emergency Medicine

## 2019-01-06 VITALS — BP 120/88 | HR 64 | Ht 65.0 in | Wt 105.0 lb

## 2019-01-06 DIAGNOSIS — R0609 Other forms of dyspnea: Secondary | ICD-10-CM | POA: Diagnosis not present

## 2019-01-06 MED ORDER — STIOLTO RESPIMAT 2.5-2.5 MCG/ACT IN AERS: 2.0000 | INHALATION_SPRAY | Freq: Every day | RESPIRATORY_TRACT | 0 refills | Status: DC

## 2019-01-06 NOTE — Patient Instructions (Signed)
Walking oximetry today on room air shows that your oxygen levels are good with exertion Please start Stiolto 2 puffs once daily.  If you benefit from this medication we will plan to continue it through your pharmacy. Keep albuterol available use 2 puffs if needed for shortness of breath, chest tightness, wheezing.  You might want to consider pretreating significant exertion with 2 puffs to see if this is beneficial. We will perform full pulmonary function testing Follow with Dr Lamonte Sakai in 1 month or next available to review your PFT and your status on the [facility]

## 2019-01-06 NOTE — Assessment & Plan Note (Signed)
Given her tobacco history, clinical response to albuterol suspect that there is untreated COPD.  She will likely benefit from scheduled bronchodilator therapy.  We will arrange for PFT to quantify her degree of obstruction.  I will start her on Stiolto 2 puffs once daily today to see if she gets benefit.  She can keep her albuterol available to use as needed, may want to pretreat exercise.  I did discuss with her the potential for side effects from the beta agonist that would potentially include tachycardia or more difficulty managing her atrial fibrillation.  She will be on guard for this.  If these side effects evolve then we will have to adjust the bronchodilator plan.  Walking oximetry today to ensure no evidence for occult desaturation

## 2019-01-06 NOTE — Progress Notes (Signed)
Subjective:    Patient ID: Beverly Li, female    DOB: 1945-12-07, 73 y.o.   MRN: [id number]  HPI 73 yo woman former smoker (50 pack-yrs, quit about a year ago) with hx HOCM, managed by Dr Marlou Porch. She has also had some A Fib, usually in NSR. Also with a hx of GERD. She has had R fem-pop bypass - at that time she was quite ill with decompensated cardiac fxn in the perioperative period. She is referred today for some exertional dyspnea. She has problems with L nasal obstruction since she had Bells Palsy years ago.   ROV 01/06/2019 --Beverly Li is 73, a former smoker (50 pack years) with a history of atrial fibrillation (recently started dofetelide), GERD, peripheral vascular disease, HOCM, Bell's palsy, history of left nasal obstruction.  She is followed by Dr. Marlou Porch.  I met her about 3 years ago when she was experiencing exertional shortness of breath.  We had discussed getting pulmonary function testing but this has not been done. Since I last saw her she has been treated for disciitis with IV abx. She notes several times a day she has dyspnea usually after walking the dog, climbing stairs. She is comfortable when sitting still. She has a lot of nasal congestion, an irritated throat, increased cough since these symptoms developed. She hears wheeze especially at night and when she has to cough. She is using albuterol 4-5x a day.    Past Medical History:  Diagnosis Date   Anxiety    Arthritis    "some in my lower back; probably elbows, knees" (11/18/2017)   Atrial fibrillation (HCC)    Bell's palsy    when pt. was 73 yrs old, when under stress the left side of face will droop.   Complication of anesthesia    "vascular OR 2016; BP bottomed out; couldn't get it regulated; ended up in ICU for DAYS" (11/18/2017)   GERD (gastroesophageal reflux disease)    Heart murmur    History of kidney stones    Hypertension    Hypertrophic cardiomyopathy (HCC)    severe LV basilar hypertrophy witn  no evidence of significant outflow tract obstruction, EF 65-70%, mild LAE, mild TR, grade 1a diastolic dysfunction 9/82/64 (Dr. Candee Furbish) (Atrial Septal Hypertrophy pattern)-- Intra-op TEE with dsignificant outflow tract obstruction - AI, MR & TR   Insomnia    Mild aortic sclerosis (HCC)    Osteopenia    Peripheral vascular disease (HCC)    Syncope    , Vagal     Family History  Problem Relation Age of Onset   Liver cancer Mother    Cancer Mother        Liver   Hypertension Mother    Lung cancer Father    Cancer Father        Lung   Breast cancer Sister    Cancer Sister        Breast     Social History   Socioeconomic History   Marital status: Married    Spouse name: Not on file   Number of children: Not on file   Years of education: Not on file   Highest education level: Not on file  Occupational History   Not on file  Social Needs   Financial resource strain: Not on file   Food insecurity    Worry: Not on file    Inability: Not on file   Transportation needs    Medical: Not on file  Non-medical: Not on file  Tobacco Use   Smoking status: Former Smoker    Packs/day: 1.00    Years: 50.00    Pack years: 50.00    Types: Cigarettes    Quit date: 12/15/2014    Years since quitting: 4.0   Smokeless tobacco: Never Used  Substance and Sexual Activity   Alcohol use: Yes    Comment: 11/18/2017 "might have a couple glasses of wine/month; if that"   Drug use: Never   Sexual activity: Not Currently  Lifestyle   Physical activity    Days per week: Not on file    Minutes per session: Not on file   Stress: Not on file  Relationships   Social connections    Talks on phone: Not on file    Gets together: Not on file    Attends religious service: Not on file    Active member of club or organization: Not on file    Attends meetings of clubs or organizations: Not on file    Relationship status: Not on file   Intimate partner violence     Fear of current or ex partner: Not on file    Emotionally abused: Not on file    Physically abused: Not on file    Forced sexual activity: Not on file  Other Topics Concern   Not on file  Social History Narrative   Not on file     Allergies  Allergen Reactions   Amoxicillin Other (See Comments)    UTI Has patient had a PCN reaction causing immediate rash, facial/tongue/throat swelling, SOB or lightheadedness with hypotension: No Has patient had a PCN reaction causing severe rash involving mucus membranes or skin necrosis: No Has patient had a PCN reaction that required hospitalization: No Has patient had a PCN reaction occurring within the last 10 years: Yes--UTI ONLY If all of the above answers are "NO", then may proceed with Cephalosporin use.    Atenolol Cough   Crestor [Rosuvastatin Calcium] Other (See Comments)    Muscle aches   Pravastatin Other (See Comments)    Muscle aches   Sulfa Antibiotics Nausea Only   Codeine     hallucinations     Outpatient Medications Prior to Visit  Medication Sig Dispense Refill   acetaminophen (TYLENOL) 500 MG tablet Take 1,500 mg by mouth every 6 (six) hours as needed (for pain.).     alendronate (FOSAMAX) 70 MG tablet Take 70 mg by mouth every Monday. Take with a full glass of water on an empty stomach.     aspirin EC 81 MG tablet Take 81 mg by mouth daily.     buPROPion (WELLBUTRIN XL) 150 MG 24 hr tablet Take 150 mg by mouth every morning.      BYSTOLIC 2.5 MG tablet TAKE 1 TABLET(2.5 MG) BY MOUTH AT BEDTIME 30 tablet 6   Calcium-Phosphorus-Vitamin D (CALCIUM/D3 ADULT GUMMIES PO) Take 2 tablets by mouth daily at 2 PM.     cyclobenzaprine (FLEXERIL) 10 MG tablet Take 0.5 tablets (5 mg total) by mouth 3 (three) times daily as needed for muscle spasms. 60 tablet 2   DEXILANT 30 MG capsule Take 30 mg by mouth daily before breakfast.      diltiazem (CARDIZEM) 30 MG tablet Take 30 mg by mouth daily as needed (blood pressure  higher than 160).      dofetilide (TIKOSYN) 250 MCG capsule TAKE 1 CAPSULE(250 MCG) BY MOUTH TWICE DAILY 60 capsule 6   ELIQUIS 5 MG  TABS tablet TAKE 1 TABLET(5 MG) BY MOUTH TWICE DAILY 180 tablet 1   ezetimibe (ZETIA) 10 MG tablet TAKE 1 TABLET(10 MG) BY MOUTH DAILY 30 tablet 9   hydrALAZINE (APRESOLINE) 25 MG tablet TAKE 1 TABLET BY MOUTH EVERY 8 HOURS AS NEEDED FOR SYSTOLIC BLOOD PRESSURE(TOP NUMBER)>160 90 tablet 3   HYDROcodone-acetaminophen (NORCO/VICODIN) 5-325 MG tablet TAKE 1 TABLET EVERY 6 HOURS AS NEEDED FOR SEVERE BACK PAIN. DO NOT TAKE WITH TYLENOL 20 tablet 0   midodrine (PROAMATINE) 10 MG tablet Take 10 mg by mouth 3 (three) times daily with meals as needed (for systolic blood pressure lower than 100).  90 tablet 1   Multiple Vitamins-Iron (STRESS B COMPLEX/IRON) TABS Take 1 tablet by mouth at bedtime.      ondansetron (ZOFRAN) 4 MG tablet Take 1 tablet (4 mg total) by mouth every 8 (eight) hours as needed for nausea or vomiting. 60 tablet 1   potassium chloride (K-DUR,KLOR-CON) 10 MEQ tablet TAKE 1 TABLET(10 MEQ) BY MOUTH DAILY 30 tablet 6   PROAIR HFA 108 (90 Base) MCG/ACT inhaler Inhale 2 puffs into the lungs 4 (four) times daily as needed (for wheezing/shortness of breath.).   1   rosuvastatin (CRESTOR) 5 MG tablet Take 0.5 tablets (2.5 mg total) by mouth daily. 90 tablet 3   zolpidem (AMBIEN) 5 MG tablet Take 5 mg by mouth at bedtime.      No facility-administered medications prior to visit.      Review of Systems  Constitutional: Negative for fever and unexpected weight change.  HENT: Negative for congestion, dental problem, ear pain, nosebleeds, postnasal drip, rhinorrhea, sinus pressure, sneezing, sore throat and trouble swallowing.   Eyes: Negative for redness and itching.  Respiratory: Positive for chest tightness and shortness of breath. Negative for cough and wheezing.   Cardiovascular: Negative for palpitations and leg swelling.  Gastrointestinal:  Negative for nausea and vomiting.  Genitourinary: Negative for dysuria.  Musculoskeletal: Negative for joint swelling.  Skin: Negative for rash.  Neurological: Negative for headaches.  Hematological: Does not bruise/bleed easily.  Psychiatric/Behavioral: Negative for dysphoric mood. The patient is not nervous/anxious.        Objective:   Physical Exam Vitals:   01/06/19 1619  BP: 120/88  Pulse: 64  SpO2: 94%  Weight: 105 lb (47.6 kg)  Height: 5' 5"  (1.651 m)    Gen: Pleasant, thin woman, in no distress,  normal affect  ENT: No lesions,  mouth clear,  oropharynx clear, no postnasal drip  Neck: No JVD, no stridor  Lungs: No use of accessory muscles, clear on normal respiration.  She does have some scattered distant expiratory wheezes on forced expiration.  Cardiovascular: RRR, heart sounds normal, no murmur or gallops, no peripheral edema  Musculoskeletal: No deformities, no cyanosis or clubbing  Neuro: alert, non focal  Skin: Warm, no lesions or rashes      Assessment & Plan:  DOE (dyspnea on exertion) Given her tobacco history, clinical response to albuterol suspect that there is untreated COPD.  She will likely benefit from scheduled bronchodilator therapy.  We will arrange for PFT to quantify her degree of obstruction.  I will start her on Stiolto 2 puffs once daily today to see if she gets benefit.  She can keep her albuterol available to use as needed, may want to pretreat exercise.  I did discuss with her the potential for side effects from the beta agonist that would potentially include tachycardia or more difficulty managing her atrial fibrillation.  She will be on guard for this.  If these side effects evolve then we will have to adjust the bronchodilator plan.  Walking oximetry today to ensure no evidence for occult desaturation  Baltazar Apo, MD, PhD 01/06/2019, 4:50 PM [facility] [phone] or if no answer [phone]

## 2019-01-13 ENCOUNTER — Ambulatory Visit: Payer: Medicare Other | Admitting: Internal Medicine

## 2019-01-13 ENCOUNTER — Encounter: Payer: Self-pay | Admitting: Internal Medicine

## 2019-01-13 ENCOUNTER — Other Ambulatory Visit: Payer: Self-pay

## 2019-01-13 DIAGNOSIS — M4644 Discitis, unspecified, thoracic region: Secondary | ICD-10-CM | POA: Diagnosis not present

## 2019-01-13 NOTE — Progress Notes (Signed)
[facility]  Patient Active Problem List   Diagnosis Date Noted  . DOE (dyspnea on exertion) 12/16/2018    Priority: High  . Cough 12/16/2018    Priority: High  . 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 Unintentional weight loss 10/05/2018    Priority: Medium  . Visit for monitoring Tikosyn therapy 11/18/2017  . Closed nondisplaced fracture of head of radius with routine healing 03/03/2017  . Coronary artery disease involving native coronary artery of native heart without angina pectoris   . Chronic diastolic heart failure (HCC) 03/28/2015  . Paroxysmal atrial fibrillation with rapid ventricular response (HCC)   . Essential hypertension   . S/P femoral-popliteal bypass surgery   . 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  . PAD (peripheral artery disease) (HCC) 03/02/2015  . Claudication (HCC) 01/31/2015  . 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 Hyperlipidemia 01/31/2015  . Atherosclerosis of native arteries of extremity with intermittent claudication (HCC) 02/10/2014  . 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 Atrial arrhythmia   . Mild aortic sclerosis (HCC)     Patient's Medications  New Prescriptions   No medications on file  Previous Medications   ACETAMINOPHEN (TYLENOL) 500 MG TABLET    Take 1,500 mg by mouth every 6 (six) hours as needed (for pain.).   ALENDRONATE (FOSAMAX) 70 MG TABLET    Take 70 mg by mouth every Monday. Take with a full glass of water on an empty stomach.   ASPIRIN EC 81 MG TABLET    Take 81 mg by mouth daily.   BUPROPION (WELLBUTRIN XL) 150 MG 24 HR TABLET    Take 150 mg by mouth every morning.    BYSTOLIC 2.5 MG TABLET    TAKE 1 TABLET(2.5 MG) BY MOUTH AT BEDTIME   CALCIUM-PHOSPHORUS-VITAMIN D (CALCIUM/D3 ADULT GUMMIES PO)    Take 2 tablets by mouth daily at 2 PM.   CYCLOBENZAPRINE (FLEXERIL) 10 MG TABLET    Take 0.5 tablets (5 mg total) by mouth 3  (three) times daily as needed for muscle spasms.   DEXILANT 30 MG CAPSULE    Take 30 mg by mouth daily before breakfast.    DILTIAZEM (CARDIZEM) 30 MG TABLET    Take 30 mg by mouth daily as needed (blood pressure higher than 160).    DOFETILIDE (TIKOSYN) 250 MCG CAPSULE    TAKE 1 CAPSULE(250 MCG) BY MOUTH TWICE DAILY   ELIQUIS 5 MG TABS TABLET    TAKE 1 TABLET(5 MG) BY MOUTH TWICE DAILY   EZETIMIBE (ZETIA) 10 MG TABLET    TAKE 1 TABLET(10 MG) BY MOUTH DAILY   HYDRALAZINE (APRESOLINE) 25 MG TABLET    TAKE 1 TABLET BY MOUTH EVERY 8 HOURS AS NEEDED FOR SYSTOLIC BLOOD PRESSURE(TOP NUMBER)>160   HYDROCODONE-ACETAMINOPHEN (NORCO/VICODIN) 5-325 MG TABLET    TAKE 1 TABLET EVERY 6 HOURS AS NEEDED FOR SEVERE BACK PAIN. DO NOT TAKE WITH TYLENOL   MIDODRINE (PROAMATINE) 10 MG TABLET    Take 10 mg by mouth 3 (three) times daily with meals as needed (for systolic blood pressure lower than 100).    MULTIPLE VITAMINS-IRON (STRESS B COMPLEX/IRON) TABS    Take 1 tablet by mouth at bedtime.    ONDANSETRON (ZOFRAN) 4 MG TABLET    Take 1 tablet (4 mg total) by mouth every 8 (eight) hours as needed for nausea or vomiting.   POTASSIUM CHLORIDE (K-DUR,KLOR-CON) 10 MEQ TABLET  TAKE 1 TABLET(10 MEQ) BY MOUTH DAILY   PROAIR HFA 108 (90 BASE) MCG/ACT INHALER    Inhale 2 puffs into the lungs 4 (four) times daily as needed (for wheezing/shortness of breath.).    ROSUVASTATIN (CRESTOR) 5 MG TABLET    Take 0.5 tablets (2.5 mg total) by mouth daily.   TIOTROPIUM BROMIDE-OLODATEROL (STIOLTO RESPIMAT) 2.5-2.5 MCG/ACT AERS    Inhale 2 puffs into the lungs daily.   ZOLPIDEM (AMBIEN) 5 MG TABLET    Take 5 mg by mouth at bedtime.   Modified Medications   No medications on file  Discontinued Medications   No medications on file    Subjective: Beverly Li is in for her routine follow-up.  She completed 60 days of IV antibiotic therapy for thoracic discitis and early osteomyelitis on 11/02/2018.  She transitioned to oral doxycycline and  cefuroxime and took that for 2 more weeks, completing therapy on 11/16/2018.  She is feeling much better.  She has a little bit of soreness in her mid back after she takes a long walk with her dog but says this is nothing like when she first got sick in April.  She is not requiring any pain medication.  She is much more active now.  Her appetite has improved but she has not gained much weight.  At the time of her visit 1 month ago she had some cough and shortness of breath.  She tested negative for COVID-19.  Her cough has resolved and she is started on a new inhaler for her shortness of breath.  Review of Systems: Review of Systems  Constitutional: Negative for fever and weight loss.  Respiratory: Positive for shortness of breath. Negative for cough and sputum production.   Cardiovascular: Negative for chest pain.  Gastrointestinal: Negative for abdominal pain, diarrhea, nausea and vomiting.  Musculoskeletal: Negative for back pain.    Past Medical History:  Diagnosis Date  . Anxiety   . Arthritis    "some in my lower back; probably elbows, knees" (11/18/2017)  . Atrial fibrillation (HCC)   . Bell's palsy    when pt. was 73 yrs old, when under stress the left side of face will droop.  . Complication of anesthesia    "vascular OR 2016; BP bottomed out; couldn't get it regulated; ended up in ICU for DAYS" (11/18/2017)  . GERD (gastroesophageal reflux disease)   . Heart murmur   . History of kidney stones   . Hypertension   . Hypertrophic cardiomyopathy (HCC)    severe LV basilar hypertrophy witn no evidence of significant outflow tract obstruction, EF 65-70%, mild LAE, mild TR, grade 1a diastolic dysfunction 05/15/10 (Dr. Donato Schultz) (Atrial Septal Hypertrophy pattern)-- Intra-op TEE with dsignificant outflow tract obstruction - AI, MR & TR  . Insomnia   . Mild aortic sclerosis (HCC)   . Osteopenia   . Peripheral vascular disease (HCC)   . Syncope    , Vagal    Social History   Tobacco  Use  . Smoking status: Former Smoker    Packs/day: 1.00    Years: 50.00    Pack years: 50.00    Types: Cigarettes    Quit date: 12/15/2014    Years since quitting: 4.0  . Smokeless tobacco: Never Used  Substance Use Topics  . Alcohol use: Yes    Comment: 11/18/2017 "might have a couple glasses of wine/month; if that"  . Drug use: Never    Family History  Problem Relation Age of Onset  .  Liver cancer Mother   . Cancer Mother        Liver  . Hypertension Mother   . Lung cancer Father   . Cancer Father        Lung  . Breast cancer Sister   . Cancer Sister        Breast    Allergies  Allergen Reactions  . Amoxicillin Other (See Comments)    UTI Has patient had a PCN reaction causing immediate rash, facial/tongue/throat swelling, SOB or lightheadedness with hypotension: No Has patient had a PCN reaction causing severe rash involving mucus membranes or skin necrosis: No Has patient had a PCN reaction that required hospitalization: No Has patient had a PCN reaction occurring within the last 10 years: Yes--UTI ONLY If all of the above answers are "NO", then may proceed with Cephalosporin use.   . Atenolol Cough  . Crestor [Rosuvastatin Calcium] Other (See Comments)    Muscle aches  . Pravastatin Other (See Comments)    Muscle aches  . Sulfa Antibiotics Nausea Only  . Codeine     hallucinations    Objective: Vitals:   01/13/19 1044  Pulse: 68  Temp: 98 F (36.7 C)  TempSrc: Oral   There is no height or weight on file to calculate BMI.  Physical Exam Constitutional:      Comments: She looks like she is feeling much better.  She is in very good spirits.  Musculoskeletal:     Comments: She has a normal spine contour without any kyphotic deformity.  Psychiatric:        Mood and Affect: Mood normal.      Problem List Items Addressed This Visit      High   Thoracic discitis    She continues to do well 2 months after completing antibiotic therapy.  I am quite  hopeful that her thoracic spine infection has now been cured.  She can follow-up here as needed.          Michel Bickers, MD [facility] [phone] pager   [phone] cell 01/13/2019, 11:02 AM

## 2019-01-13 NOTE — Assessment & Plan Note (Signed)
She continues to do well 2 months after completing antibiotic therapy.  I am quite hopeful that her thoracic spine infection has now been cured.  She can follow-up here as needed.

## 2019-01-20 ENCOUNTER — Telehealth: Payer: Self-pay | Admitting: Emergency Medicine

## 2019-01-20 MED ORDER — STIOLTO RESPIMAT 2.5-2.5 MCG/ACT IN AERS: 2.0000 | INHALATION_SPRAY | Freq: Every day | RESPIRATORY_TRACT | 5 refills | Status: DC

## 2019-01-20 NOTE — Telephone Encounter (Signed)
Called and spoke w/ pt to let her know we are sending a refill of her Stiolto to her preferred pharmacy. Pt verbalized understanding with no additional questions. Nothing further needed at this time.

## 2019-01-25 ENCOUNTER — Ambulatory Visit: Payer: Medicare Other | Admitting: Cardiology

## 2019-02-09 ENCOUNTER — Telehealth: Payer: Self-pay | Admitting: *Deleted

## 2019-02-09 DIAGNOSIS — Z006 Encounter for examination for normal comparison and control in clinical research program: Secondary | ICD-10-CM

## 2019-02-09 NOTE — Telephone Encounter (Signed)
GOULD EDUInformed Consent  Subject Name:Beverly Li  Subject met inclusion and exclusion criteria. The informed consent form, study requirements and expectations were reviewed with the subject and questions and concerns were addressed prior to the signing of the consent form. The subject verbalized understanding of the trial requirements. The subject agreed to participate in the [place] and gave verbal consent to participate in the [place] 02/05/2019. The informed consent was obtained prior to performance of any protocol-specific procedures for the subject. A copy of the signed informed consent was mailedto the subject and a copy was placed in the subject's medical record.  Oletta Cohn.

## 2019-02-15 ENCOUNTER — Other Ambulatory Visit: Payer: Self-pay | Admitting: Internal Medicine

## 2019-02-15 MED ORDER — DOFETILIDE 250 MCG PO CAPS: ORAL_CAPSULE | ORAL | 0 refills | Status: DC

## 2019-02-22 ENCOUNTER — Other Ambulatory Visit: Payer: Self-pay

## 2019-02-22 MED ORDER — POTASSIUM CHLORIDE CRYS ER 10 MEQ PO TBCR: 10.0000 meq | EXTENDED_RELEASE_TABLET | Freq: Every day | ORAL | 6 refills | Status: DC

## 2019-02-22 MED ORDER — NEBIVOLOL HCL 2.5 MG PO TABS: 2.5000 mg | ORAL_TABLET | Freq: Every day | ORAL | 2 refills | Status: DC

## 2019-03-12 ENCOUNTER — Other Ambulatory Visit (HOSPITAL_COMMUNITY)
Admission: RE | Admit: 2019-03-12 | Discharge: 2019-03-12 | Disposition: A | Discharge status: Home / Self Care | Payer: Medicare Other | Source: Ambulatory Visit | Attending: Emergency Medicine | Admitting: Emergency Medicine

## 2019-03-12 DIAGNOSIS — Z01812 Encounter for preprocedural laboratory examination: Secondary | ICD-10-CM | POA: Insufficient documentation

## 2019-03-12 DIAGNOSIS — Z20828 Contact with and (suspected) exposure to other viral communicable diseases: Secondary | ICD-10-CM | POA: Insufficient documentation

## 2019-03-12 LAB — SARS CORONAVIRUS 2 (TAT 6-24 HRS): SARS Coronavirus 2: NEGATIVE

## 2019-03-15 ENCOUNTER — Other Ambulatory Visit: Payer: Self-pay

## 2019-03-15 ENCOUNTER — Encounter: Payer: Self-pay | Admitting: Emergency Medicine

## 2019-03-15 ENCOUNTER — Ambulatory Visit: Payer: Medicare Other | Admitting: Emergency Medicine

## 2019-03-15 ENCOUNTER — Ambulatory Visit (INDEPENDENT_AMBULATORY_CARE_PROVIDER_SITE_OTHER): Payer: Medicare Other | Admitting: Emergency Medicine

## 2019-03-15 DIAGNOSIS — R0609 Other forms of dyspnea: Secondary | ICD-10-CM

## 2019-03-15 DIAGNOSIS — R0600 Dyspnea, unspecified: Secondary | ICD-10-CM | POA: Diagnosis not present

## 2019-03-15 DIAGNOSIS — J449 Chronic obstructive pulmonary disease, unspecified: Secondary | ICD-10-CM

## 2019-03-15 LAB — PULMONARY FUNCTION TEST
DL/VA % pred: 53 %
DL/VA: 2.22 ml/min/mmHg/L
DLCO unc % pred: 41 %
DLCO unc: 7.74 ml/min/mmHg
FEF 25-75 Post: 0.41 L/sec
FEF 25-75 Pre: 0.35 L/sec
FEF2575-%Change-Post: 16 %
FEF2575-%Pred-Post: 23 %
FEF2575-%Pred-Pre: 20 %
FEV1-%Change-Post: 10 %
FEV1-%Pred-Post: 43 %
FEV1-%Pred-Pre: 39 %
FEV1-Post: 0.91 L
FEV1-Pre: 0.82 L
FEV1FVC-%Change-Post: 7 %
FEV1FVC-%Pred-Pre: 59 %
FEV6-%Change-Post: 4 %
FEV6-%Pred-Post: 71 %
FEV6-%Pred-Pre: 67 %
FEV6-Post: 1.87 L
FEV6-Pre: 1.78 L
FEV6FVC-%Change-Post: 1 %
FEV6FVC-%Pred-Post: 103 %
FEV6FVC-%Pred-Pre: 101 %
FVC-%Change-Post: 3 %
FVC-%Pred-Post: 69 %
FVC-%Pred-Pre: 66 %
FVC-Post: 1.91 L
FVC-Pre: 1.85 L
Post FEV1/FVC ratio: 48 %
Post FEV6/FVC ratio: 98 %
Pre FEV1/FVC ratio: 45 %
Pre FEV6/FVC Ratio: 96 %
RV % pred: 143 %
RV: 3.16 L
TLC % pred: 104 %
TLC: 5.13 L

## 2019-03-15 MED ORDER — DOFETILIDE 250 MCG PO CAPS: ORAL_CAPSULE | ORAL | 0 refills | Status: DC

## 2019-03-15 NOTE — Progress Notes (Signed)
Subjective:    Patient ID: Beverly Li, female    DOB: 25-Sep-1945, 73 y.o.   MRN: [id number]  HPI  ROV 01/06/2019 --Beverly Li is 35, a former smoker (50 pack years) with a history of atrial fibrillation (recently started dofetelide), GERD, peripheral vascular disease, HOCM, Bell's palsy, history of left nasal obstruction.  She is followed by Dr. Marlou Porch.  I met her about 3 years ago when she was experiencing exertional shortness of breath.  We had discussed getting pulmonary function testing but this has not been done. Since I last saw her she has been treated for disciitis with IV abx. She notes several times a day she has dyspnea usually after walking the dog, climbing stairs. She is comfortable when sitting still. She has a lot of nasal congestion, an irritated throat, increased cough since these symptoms developed. She hears wheeze especially at night and when she has to cough. She is using albuterol 4-5x a day.   ROV 03/15/2019 --73 year old former smoker with A. fib, GERD, PVD, HOCM.  She reestablished care in September to evaluate dyspnea.  She has cough, throat irritation, hears occasional wheezing.  I started her empirically on Stiolto at that time to see if she would get benefit.  She returns today to review.  Pulmonary function testing done today was reviewed, shows very severe obstruction without a significant bronchodilator response, hyperinflated volumes and decreased diffusion capacity.  She reports that she took the [place] for about 5 days, then she stopped it after she felt better.  She doesn't feel as SOB currently as she did when we met in September. Minimal cough. No wheeze. No desat on walk last time. Flu shot up to date. She believes that she may have had PNA shot before w Dr Radene Ou - she will check.    Review of Systems  Constitutional: Negative for fever and unexpected weight change.  HENT: Negative for congestion, dental problem, ear pain, nosebleeds, postnasal drip,  rhinorrhea, sinus pressure, sneezing, sore throat and trouble swallowing.   Eyes: Negative for redness and itching.  Respiratory: Positive for chest tightness and shortness of breath. Negative for cough and wheezing.   Cardiovascular: Negative for palpitations and leg swelling.  Gastrointestinal: Negative for nausea and vomiting.  Genitourinary: Negative for dysuria.  Musculoskeletal: Negative for joint swelling.  Skin: Negative for rash.  Neurological: Negative for headaches.  Hematological: Does not bruise/bleed easily.  Psychiatric/Behavioral: Negative for dysphoric mood. The patient is not nervous/anxious.        Objective:   Physical Exam Vitals:   03/15/19 1618  BP: (!) 166/88  Pulse: 72  Temp: (!) 97 F (36.1 C)  TempSrc: Oral  SpO2: 94%  Weight: 110 lb (49.9 kg)  Height: _0  (1.6 m)    Gen: Pleasant, thin woman, in no distress,  normal affect  ENT: No lesions,  mouth clear,  oropharynx clear, no postnasal drip  Neck: No JVD, no stridor  Lungs: No use of accessory muscles, clear on normal respiration.  She does have some scattered distant expiratory wheezes on forced expiration.  Cardiovascular: RRR, heart sounds normal, no murmur or gallops, no peripheral edema  Musculoskeletal: No deformities, no cyanosis or clubbing  Neuro: alert, non focal  Skin: Warm, no lesions or rashes      Assessment & Plan:  COPD (chronic obstructive pulmonary disease) (HCC) Confirmed on pulmonary function testing, very severe obstruction.  She is actually quite functional despite her obstructive lung disease.  I think we can  improve her functional capacity by restarting Stiolto.  She is willing to do this.  She did not desaturate (borderline) last visit.  I did talk to her about possibly needing oxygen at some time in the future as her lung function changes.  Flu shot up-to-date.  She will check and see if she has had the pneumonia shot with Dr. Radene Ou.  If not then she can get it  either here or there.  Baltazar Apo, MD, PhD 03/15/2019, 4:38 PM [facility] [phone] or if no answer [phone]

## 2019-03-15 NOTE — Assessment & Plan Note (Signed)
Confirmed on pulmonary function testing, very severe obstruction.  She is actually quite functional despite her obstructive lung disease.  I think we can improve her functional capacity by restarting Stiolto.  She is willing to do this.  She did not desaturate (borderline) last visit.  I did talk to her about possibly needing oxygen at some time in the future as her lung function changes.  Flu shot up-to-date.  She will check and see if she has had the pneumonia shot with Dr. Radene Ou.  If not then she can get it either here or there.

## 2019-03-15 NOTE — Progress Notes (Signed)
PFT done today. 

## 2019-03-15 NOTE — Patient Instructions (Signed)
Restart Stiolto 2 puffs once daily. Keep albuterol available to use 2 puffs if needed for shortness of breath, chest tightness, wheezing. Flu shot up-to-date Please check with Dr. Radene Ou to see if you have ever had one of the pneumonia vaccines.  You can call our office to let us know. Follow with Dr Lamonte Sakai in 6 months or sooner if you have any problems

## 2019-03-16 ENCOUNTER — Telehealth: Payer: Self-pay | Admitting: Cardiology

## 2019-03-16 MED ORDER — DOFETILIDE 250 MCG PO CAPS: ORAL_CAPSULE | ORAL | 1 refills | Status: DC

## 2019-03-16 NOTE — Telephone Encounter (Signed)
Appt made with Dr Caryl Comes for 04/26/19 Refill sent via epic for 90 day supply ./cy

## 2019-03-16 NOTE — Telephone Encounter (Signed)
New message   Patient would like a call to discuss getting Tikosyn. She does not feel that she needs to see Dr. Caryl Comes to get this medication since she has just seen Dr. Marlou Porch. Please advise.

## 2019-04-26 ENCOUNTER — Other Ambulatory Visit: Payer: Self-pay

## 2019-04-26 ENCOUNTER — Ambulatory Visit: Payer: Medicare Other | Admitting: Internal Medicine

## 2019-04-26 ENCOUNTER — Encounter: Payer: Self-pay | Admitting: Internal Medicine

## 2019-04-26 VITALS — BP 138/80 | HR 69 | Ht 63.0 in | Wt 110.0 lb

## 2019-04-26 DIAGNOSIS — I421 Obstructive hypertrophic cardiomyopathy: Secondary | ICD-10-CM

## 2019-04-26 DIAGNOSIS — I959 Hypotension, unspecified: Secondary | ICD-10-CM

## 2019-04-26 NOTE — Patient Instructions (Signed)
Medication Instructions:  Your physician recommends that you continue on your current medications as directed. Please refer to the Current Medication list given to you today.  *If you need a refill on your cardiac medications before your next appointment, please call your pharmacy*  Lab Work: None ordered.  If you have labs (blood work) drawn today and your tests are completely normal, you will receive your results only by: . MyChart Message (if you have MyChart) OR . A paper copy in the mail If you have any lab test that is abnormal or we need to change your treatment, we will call you to review the results.  Testing/Procedures: None ordered.   Follow-Up: At [facility], you and your health needs are our priority.  As part of our continuing mission to provide you with exceptional heart care, we have created designated Provider Care Teams.  These Care Teams include your primary Cardiologist (physician) and Advanced Practice Providers (APPs -  Physician Assistants and Nurse Practitioners) who all work together to provide you with the care you need, when you need it.  Your next appointment:  6 months with Dr Klein   

## 2019-04-26 NOTE — Progress Notes (Signed)
Patient Care Team: Rankins, Fanny Dance, MD as PCP - General (Family Medicine) [name], MD as PCP - Cardiology (Cardiology) Shirlean Kelly, MD as Consulting Physician (Neurosurgery)   HPI  Beverly Li is a 74 y.o. female  Seen at the request of Dr. 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  Has a history of labile hypertension and during vascular procedure 11/16 she developed hypotension and suffered a respiratory arrest.  She underwent intubation and phenylephrine infusion.    1/18 she underwent catheterization for chest pain.  No coronary disease was noted but she again had significant postprocedural hypertension and received IV hydralazine.  She became profoundly bradycardic and hypotensive again requiring Neo-Synephrine.  She developed respiratory distress but did not require intubation.  Currently taking dofetilide without recurrent atrial fibrillation  On Eliquis as well as aspirin without bleeding.  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  Has recovered.  Date Cr K Hgb  7/20 0.63 4.0 14.0           Records and Results Reviewed   Past Medical History:  Diagnosis Date  . Anxiety   . Arthritis    "some in my lower back; probably elbows, knees" (11/18/2017)  . Atrial fibrillation (HCC)   . Bell's palsy    when pt. was 74 yrs old, when under stress the left side of face will droop.  . Complication of anesthesia    "vascular OR 2016; BP bottomed out; couldn't get it regulated; ended up in ICU for DAYS" (11/18/2017)  . GERD (gastroesophageal reflux disease)   . Heart murmur   . History of kidney stones   . Hypertension   . Hypertrophic cardiomyopathy (HCC)    severe LV basilar hypertrophy witn no evidence of significant outflow tract obstruction, EF 65-70%, mild LAE, mild TR, grade 1a diastolic dysfunction 05/15/10 (Dr. Donato Schultz) (Atrial Septal Hypertrophy pattern)--  Intra-op TEE with dsignificant outflow tract obstruction - AI, MR & TR  . Insomnia   . Mild aortic sclerosis   . Osteopenia   . Peripheral vascular disease (HCC)   . Syncope    , Vagal    Past Surgical History:  Procedure Laterality Date  . AUGMENTATION MAMMAPLASTY Bilateral   . BACK SURGERY    . CARDIAC CATHETERIZATION N/A 05/07/2016   Procedure: Left Heart Cath and Coronary Angiography;  Surgeon: Kathleene Hazel, MD;  Location: [facility] INVASIVE CV LAB;  Service: Cardiovascular;  Laterality: N/A;  . CARDIOVERSION N/A 09/24/2017   Procedure: CARDIOVERSION;  Surgeon: Laurey Morale, MD;  Location: [facility] ENDOSCOPY;  Service: Cardiovascular;  Laterality: N/A;  . DILATION AND CURETTAGE OF UTERUS    . ENDARTERECTOMY FEMORAL Right 03/02/2015   Procedure: ENDARTERECTOMY RIGHT FEMORAL;  Surgeon: Chuck Hint, MD;  Location: [facility] OR;  Service: Vascular;  Laterality: Right;  . FACIAL COSMETIC SURGERY Left 2002   "related to Bell's Palsy @ age 6; left eye/side of face droopy; tried to make area symmetrical"  . FEMORAL-POPLITEAL BYPASS GRAFT Right 03/02/2015   Procedure: BYPASS GRAFT FEMORAL-BELOW KNEE POPLITEAL ARTERY;  Surgeon: Chuck Hint, MD;  Location: [facility] OR;  Service: Vascular;  Laterality: Right;  . INGUINAL HERNIA REPAIR Bilateral 2002  . OVARIAN CYST REMOVAL Left   . PERIPHERAL VASCULAR CATHETERIZATION N/A 01/16/2015   Procedure: Abdominal Aortogram;  Surgeon: Chuck Hint, MD;  Location: [facility] INVASIVE CV LAB;  Service: Cardiovascular;  Laterality: N/A;  . POSTERIOR LUMBAR FUSION  2015   "have plates and screws in there"  . TONSILLECTOMY      Current Meds  Medication Sig  . acetaminophen (TYLENOL) 500 MG tablet Take 1,500 mg by mouth every 6 (six) hours as needed (for pain.).  [name] alendronate (FOSAMAX) 70 MG tablet Take 70 mg by mouth every Monday. Take with a full glass of water on an empty stomach.  [name] aspirin EC 81 MG tablet Take 81 mg by mouth daily.  [name]  buPROPion (WELLBUTRIN XL) 150 MG 24 hr tablet Take 150 mg by mouth every morning.   . Calcium-Phosphorus-Vitamin D (CALCIUM/D3 ADULT GUMMIES PO) Take 2 tablets by mouth daily at 2 PM.  . diltiazem (CARDIZEM) 30 MG tablet Take 30 mg by mouth daily as needed (blood pressure higher than 160).   . dofetilide (TIKOSYN) 250 MCG capsule TAKE 1 CAPSULE(250 MCG) BY MOUTH TWICE DAILY.  [name] ELIQUIS 5 MG TABS tablet TAKE 1 TABLET(5 MG) BY MOUTH TWICE DAILY  . ezetimibe (ZETIA) 10 MG tablet TAKE 1 TABLET(10 MG) BY MOUTH DAILY  . hydrALAZINE (APRESOLINE) 25 MG tablet TAKE 1 TABLET BY MOUTH EVERY 8 HOURS AS NEEDED FOR SYSTOLIC BLOOD PRESSURE(TOP NUMBER)>160  . midodrine (PROAMATINE) 10 MG tablet Take 10 mg by mouth 3 (three) times daily with meals as needed (for systolic blood pressure lower than 100).   . Multiple Vitamins-Iron (STRESS B COMPLEX/IRON) TABS Take 1 tablet by mouth at bedtime.   . nebivolol (BYSTOLIC) 2.5 MG tablet Take 1 tablet (2.5 mg total) by mouth daily.  . potassium chloride (KLOR-CON) 10 MEQ tablet Take 1 tablet (10 mEq total) by mouth daily.  [name] PROAIR HFA 108 (90 Base) MCG/ACT inhaler Inhale 2 puffs into the lungs 4 (four) times daily as needed (for wheezing/shortness of breath.).   . Tiotropium Bromide-Olodaterol (STIOLTO RESPIMAT) 2.5-2.5 MCG/ACT AERS Inhale 2 puffs into the lungs daily.  [name] zolpidem (AMBIEN) 10 MG tablet Take 10 mg by mouth at bedtime as needed.    Allergies  Allergen Reactions  . Amoxicillin Other (See Comments)    UTI Has patient had a PCN reaction causing immediate rash, facial/tongue/throat swelling, SOB or lightheadedness with hypotension: No Has patient had a PCN reaction causing severe rash involving mucus membranes or skin necrosis: No Has patient had a PCN reaction that required hospitalization: No Has patient had a PCN reaction occurring within the last 10 years: Yes--UTI ONLY If all of the above answers are "NO", then may proceed with Cephalosporin use.     . Atenolol Cough  . Crestor [Rosuvastatin Calcium] Other (See Comments)    Muscle aches  . Pravastatin Other (See Comments)    Muscle aches  . Sulfa Antibiotics Nausea Only  . Codeine     hallucinations      Review of Systems negative except from HPI and PMH  Physical Exam BP 138/80   Pulse 69   Ht 5\' 3"  (1.6 m)   Wt 110 lb (49.9 kg)   LMP  (LMP Unknown)   SpO2 93%   BMI 19.49 kg/m  Well developed and nourished in no acute distress HENT normal Neck supple with JVP-  flat   Clear Regular rate and rhythm, 2/6 systolic m Abd-soft with active BS No Clubbing cyanosis edema Skin-warm and dry A & Oriented  Grossly normal sensory and motor function  ECG sinus at 69 Interval 21/08/43 Biatrial enlargement Nonspecific ST-T changes V4-V6 and 1-L b  Assessment and  Plan  Hypertrophic obstructive cardiomyopathy  Sinus bradycardia  Hypertension   Vasovagal events    Tolerating dofetilide.  Using as needed midodrine and hydralazine for blood pressure.  Had gene testing down at [facility].  Do not see the results.  We will stop aspirin and adjunctive to her Eliquis.     Current medicines are reviewed at length with the patient today .  The patient does not  have concerns regarding medicines.

## 2019-05-06 ENCOUNTER — Ambulatory Visit: Payer: Medicare Other | Attending: Internal Medicine

## 2019-05-06 DIAGNOSIS — Z23 Encounter for immunization: Secondary | ICD-10-CM | POA: Insufficient documentation

## 2019-05-06 NOTE — Progress Notes (Signed)
   Covid-19 Vaccination Clinic  Name:  Beverly Li    MRN: [id number] DOB: 1945-10-02  05/06/2019  [name] was observed post Covid-19 immunization for 15 minutes without incidence. She was provided with Vaccine Information Sheet and instruction to access the V-Safe system.   [name] was instructed to call 911 with any severe reactions post vaccine: [name] Difficulty breathing  . Swelling of your face and throat  . A fast heartbeat  . A bad rash all over your body  . Dizziness and weakness    Immunizations Administered    Name Date Dose VIS Date Route   Pfizer COVID-19 Vaccine 05/06/2019  1:51 PM 0.3 mL 03/26/2019 Intramuscular   Manufacturer: [employer], [employer]   Lot: [id number]   NDC: [address]-4

## 2019-05-17 ENCOUNTER — Other Ambulatory Visit: Payer: Self-pay

## 2019-05-17 MED ORDER — APIXABAN 5 MG PO TABS: ORAL_TABLET | ORAL | 1 refills | Status: DC

## 2019-05-17 NOTE — Telephone Encounter (Signed)
Pt last saw Dr Graciela Husbands 04/26/19, last labs 10/26/18 Creat 0.63, age 74, weight 49.9kg, based on specified criteria pt is on appropriate dosage of Eliquis 5mg  BID.  Will refill rx.

## 2019-05-27 ENCOUNTER — Ambulatory Visit: Payer: Medicare Other | Attending: Internal Medicine

## 2019-05-27 DIAGNOSIS — Z23 Encounter for immunization: Secondary | ICD-10-CM

## 2019-05-27 NOTE — Progress Notes (Signed)
   Covid-19 Vaccination Clinic  Name:  Beverly Li    MRN: [id number] DOB: 12/31/1945  05/27/2019  [name] was observed post Covid-19 immunization for 15 minutes without incidence. She was provided with Vaccine Information Sheet and instruction to access the V-Safe system.   [name] was instructed to call 911 with any severe reactions post vaccine: [name] Difficulty breathing  . Swelling of your face and throat  . A fast heartbeat  . A bad rash all over your body  . Dizziness and weakness    Immunizations Administered    Name Date Dose VIS Date Route   Pfizer COVID-19 Vaccine 05/27/2019  2:38 PM 0.3 mL 03/26/2019 Intramuscular   Manufacturer: [employer], [employer]   Lot: [id number]   NDC: [id number]

## 2019-06-29 ENCOUNTER — Other Ambulatory Visit: Payer: Self-pay | Admitting: Internal Medicine

## 2019-06-30 ENCOUNTER — Encounter: Payer: Self-pay | Admitting: Cardiology

## 2019-06-30 ENCOUNTER — Ambulatory Visit: Payer: Medicare Other | Admitting: Cardiology

## 2019-06-30 ENCOUNTER — Other Ambulatory Visit: Payer: Self-pay

## 2019-06-30 VITALS — BP 154/90 | HR 70 | Ht 63.0 in | Wt 111.0 lb

## 2019-06-30 DIAGNOSIS — I421 Obstructive hypertrophic cardiomyopathy: Secondary | ICD-10-CM | POA: Diagnosis not present

## 2019-06-30 DIAGNOSIS — G909 Disorder of the autonomic nervous system, unspecified: Secondary | ICD-10-CM

## 2019-06-30 NOTE — Progress Notes (Signed)
Cardiology Office Note   Date:  06/30/2019   ID:  Beverly Li, DOB Feb 14, 1946, MRN [id number]  PCP:  Clayborn Heron, MD  Cardiologist:   Donato Schultz, MD / Graciela Husbands       History of Present Illness: Beverly Li is a 74 y.o. female  with hypertrophic obstructive cardiomyopathy, recent severe exacerbation again post cardiac catheterization and prior during femoropopliteal bypass resulting in  phenylephrine , fluids and prolonged intubation here for follow-up.  She underwent diagnostic heart catheterization on 05/07/16 and post procedure developed severe hypotension, hyper vagal response. Requiring once again phenylephrine, facemask oxygen. She did not require intubation. 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 Hydralazine was given post catheterization wound systolic pressure pressure was 190. Profound hypotension and bradycardia noted. This was treated rapidly, fluids, phenylephrine, heart rates as low as the 40s.  Midodrine started. Phenylephrine eventually weaned off. Diuresis noted. Klonopin was started for anxiety, to hopefully help blunt the hyper adrenergic response. She denies being atypically anxious person however.  Echocardiogram 04/2016 showed EF 65-70% with hypertrophic obstructive cardiomyopathy with LVOT gradient of 23 mm at rest.  She is here for follow-up today and should have appointment set up with Dr. Graciela Husbands to discuss her profound autonomic dysfunction.  In 2014 started having "heart burn" episodes. Debilitating. Out walking dog. By time she would get back, sweat, diahpesis, up neck, nausea then diarrhea. Wiped out for a few hours. Does not feel it now with exercise. Felt 3 hours irreg hb afib kicked in. Afaid to go anywhere. The sweat comes over her hot. Pain. After she described this once again, this is why I proceeded with cardiac catheterization to make sure that with her peripheral vascular disease that she did not have severe  coronary artery disease that was now becoming progressive. Thankfully, she did not have flow limiting disease. Moderate LAD, FFR.  Shortly after cardiac catheterization when her blood pressure increased to the 190 systolic range, she was given hydralazine to reduce her blood pressure and she ended up having a hyper vagal response where her heart rate decreases the 40s, blood pressure plummeted into the 50 systolic, she developed flash pulmonary edema. She was placed in the CCU, phenylephrine. Slowly, this was able to be weaned. Klonopin and Midodrin was started.  This episode post catheterization was very similar to her episode intraoperatively during her femoropopliteal bypass. She was intubated for several days after that procedure and with her arterial line demonstrated classic spike and dome changes intermittently with marked variations in her blood pressure at various points.  09/16/16-since her last visit she has had cardiac MRI and stress echocardiogram. Both were reassuring. She did not drop her blood pressures during exercise. She did not increase her gradient significantly during exercise. We were able to invoke an increase left ventricular output tract gradient with Valsalva maneuver. Please see report. No ventricular arrhythmias. She states that over the last 3 months she's felt the best that she's felt in quite some time. She is drinking sports drinks quite frequently, propel. When she does have low blood pressure usually has a prodrome, very vagal-like response as noted previously.  03/31/17-she had a fall in [place] when walking on cobblestones in a garden with flip-flops during a house store with her girlfriends.  She states that she broke her fall with her lips.  She also broke her arm.  Dr. Cleophas Dunker.  She has not had any severe cardiac symptoms thankfully.  At  one point during the last several months she did have low blood pressure and she took the Midrin which took effect after  approximately 3 hours.  She has been drinking propel.  Doing very well.  06/26/17 - 2 episodes with crashed BP, out for the day. Midodrine then. 185/110 at home.  She took a hydralazine at home and now her blood pressure is 158/90.  We once again went over how challenging it is for her given her hypertrophic cardiomyopathy and previous severe hypotension.  Currently she is not have any chest pain, no strokelike symptoms.  No fevers chills nausea vomiting.  10/28/2017- cardioversion took place in June 2019 setting of 2-1 atrial flutter with rapid ventricular response for approximately 4 days causing symptoms.  Dr. Shirlee Latch performed cardioversion with Dr. Jean Rosenthal as anesthesiologist.  Dr. Graciela Husbands saw her in clinic prior to conversion.  Overall she has been doing quite well.  She did have one other episode at the beach lasting approximately 2 or 3 hours at a heart rate that she says was 125 to 130 bpm.  No syncope, no bleeding, no orthopnea, no PND.  12/28/18 -chief complaint: She has been experiencing more shortness of breath.  She dealt with discitis and prolonged IV antibiotic course.  Fatigue.  After long discussion with infectious disease, we did not pursue invasive biopsy because of problems with anesthesia in the past. COVID neg. Rely on albuterol. Helps. No syncope in 6 months. Tikosyn has worked well. Phelem.  Has lost quite a bit of weight during this infection.  Was depressed for a little while.  Starting to come back she states.  Likely, over the last 6 months she is not had any drops in her blood pressure.  06/30/2019 -Here for follow-up of hypertrophic cardiomyopathy.  Overall doing quite well.  Received both Covid vaccines.  Imp[facility] process [facility].  No syncope no chest pain.  Dr. Odessa Fleming note reviewed see below.  Pulmonary, Dr. Kavin Leech note reviewed.  Severe obstruction noted on PFT.  She is now taking the inhaler Stiolto.  Interestingly, she was going through paperwork  found her father's autopsy printed on onion paper.  She stated that hypertrophic heart was listed on his report.  Past Medical History:  Diagnosis Date  . Anxiety   . Arthritis    "some in my lower back; probably elbows, knees" (11/18/2017)  . Atrial fibrillation (HCC)   . Bell's palsy    when pt. was 74 yrs old, when under stress the left side of face will droop.  . Complication of anesthesia    "vascular OR 2016; BP bottomed out; couldn't get it regulated; ended up in ICU for DAYS" (11/18/2017)  . GERD (gastroesophageal reflux disease)   . Heart murmur   . History of kidney stones   . Hypertension   . Hypertrophic cardiomyopathy (HCC)    severe LV basilar hypertrophy witn no evidence of significant outflow tract obstruction, EF 65-70%, mild LAE, mild TR, grade 1a diastolic dysfunction 05/15/10 (Dr. Donato Schultz) (Atrial Septal Hypertrophy pattern)-- Intra-op TEE with dsignificant outflow tract obstruction - AI, MR & TR  . Insomnia   . Mild aortic sclerosis   . Osteopenia   . Peripheral vascular disease (HCC)   . Syncope    , Vagal    Past Surgical History:  Procedure Laterality Date  . AUGMENTATION MAMMAPLASTY Bilateral   . BACK SURGERY    . CARDIAC CATHETERIZATION N/A 05/07/2016   Procedure: Left Heart Cath and  Coronary Angiography;  Surgeon: Kathleene Hazelhristopher D McAlhany, MD;  [facility];  Service: Cardiovascular;  Laterality: N/A;  . CARDIOVERSION N/A 09/24/2017   Procedure: CARDIOVERSION;  Surgeon: Laurey MoraleMcLean, Dalton S, MD;  [facility];  Service: Cardiovascular;  Laterality: N/A;  . DILATION AND CURETTAGE OF UTERUS    . ENDARTERECTOMY FEMORAL Right 03/02/2015   Procedure: ENDARTERECTOMY RIGHT FEMORAL;  Surgeon: Chuck Hinthristopher S Dickson, MD;  [facility];  Service: Vascular;  Laterality: Right;  . FACIAL COSMETIC SURGERY Left 2002   "related to Bell's Palsy @ age 74; left eye/side of face droopy; tried to make area symmetrical"  . FEMORAL-POPLITEAL BYPASS GRAFT  Right 03/02/2015   Procedure: BYPASS GRAFT FEMORAL-BELOW KNEE POPLITEAL ARTERY;  Surgeon: Chuck Hinthristopher S Dickson, MD;  [facility];  Service: Vascular;  Laterality: Right;  . INGUINAL HERNIA REPAIR Bilateral 2002  . OVARIAN CYST REMOVAL Left   . PERIPHERAL VASCULAR CATHETERIZATION N/A 01/16/2015   Procedure: Abdominal Aortogram;  Surgeon: Chuck Hinthristopher S Dickson, MD;  [facility];  Service: Cardiovascular;  Laterality: N/A;  . POSTERIOR LUMBAR FUSION  2015   "have plates and screws in there"  . TONSILLECTOMY       Current Outpatient Medications  Medication Sig Dispense Refill  . acetaminophen (TYLENOL) 500 MG tablet Take 1,500 mg by mouth every 6 (six) hours as needed (for pain.).    [name]. alendronate (FOSAMAX) 70 MG tablet Take 70 mg by mouth every Monday. Take with a full glass of water on an empty stomach.    [name]. apixaban (ELIQUIS) 5 MG TABS tablet TAKE 1 TABLET(5 MG) BY MOUTH TWICE DAILY 180 tablet 1  . buPROPion (WELLBUTRIN XL) 150 MG 24 hr tablet Take 150 mg by mouth every morning.     . Calcium-Phosphorus-Vitamin D (CALCIUM/D3 ADULT GUMMIES PO) Take 2 tablets by mouth daily at 2 PM.    . diltiazem (CARDIZEM) 30 MG tablet Take 30 mg by mouth daily as needed (blood pressure higher than 160).     . dofetilide (TIKOSYN) 250 MCG capsule TAKE 1 CAPSULE(250 MCG) BY MOUTH TWICE DAILY 180 capsule 3  . ezetimibe (ZETIA) 10 MG tablet TAKE 1 TABLET(10 MG) BY MOUTH DAILY 30 tablet 9  . hydrALAZINE (APRESOLINE) 25 MG tablet TAKE 1 TABLET BY MOUTH EVERY 8 HOURS AS NEEDED FOR SYSTOLIC BLOOD PRESSURE(TOP NUMBER)>160 90 tablet 3  . midodrine (PROAMATINE) 10 MG tablet Take 10 mg by mouth 3 (three) times daily with meals as needed (for systolic blood pressure lower than 100).  90 tablet 1  . Multiple Vitamins-Iron (STRESS B COMPLEX/IRON) TABS Take 1 tablet by mouth at bedtime.     . nebivolol (BYSTOLIC) 2.5 MG tablet Take 1 tablet (2.5 mg total) by mouth daily. 90 tablet 2  . potassium  chloride (KLOR-CON) 10 MEQ tablet Take 1 tablet (10 mEq total) by mouth daily. 30 tablet 6  . PROAIR HFA 108 (90 Base) MCG/ACT inhaler Inhale 2 puffs into the lungs 4 (four) times daily as needed (for wheezing/shortness of breath.).   1  . Tiotropium Bromide-Olodaterol (STIOLTO RESPIMAT) 2.5-2.5 MCG/ACT AERS Inhale 2 puffs into the lungs daily. 8 g 5  . zolpidem (AMBIEN) 10 MG tablet Take 10 mg by mouth at bedtime as needed.     No current facility-administered medications for this visit.    Allergies:   Amoxicillin, Atenolol, Crestor [rosuvastatin calcium], Pravastatin, Sulfa antibiotics, and Codeine    Social History:  The patient  reports that she quit smoking  about 4 years ago. Her smoking use included cigarettes. She has a 50.00 pack-year smoking history. She has never used smokeless tobacco. She reports current alcohol use. She reports that she does not use drugs.   Family History:  The patient's family history includes Breast cancer in her sister; Cancer in her father, mother, and sister; Hypertension in her mother; Liver cancer in her mother; Lung cancer in her father.    ROS:  Please see the history of present illness.  All others negative.   PHYSICAL EXAM: VS:  BP (!) 154/90   Pulse 70   Ht 5\' 3"  (1.6 m)   Wt 111 lb (50.3 kg)   LMP  (LMP Unknown)   SpO2 90%   BMI 19.66 kg/m  , BMI Body mass index is 19.66 kg/m. GEN: Thin, in no acute distress  HEENT: normal  Neck: no JVD, carotid bruits, or masses Cardiac: RRR; 2/6 SM  rubs, or gallops,no edema no changes Respiratory: Mild wheeze heard in bases bilaterally GI: soft, nontender, nondistended, + BS MS: no deformity or atrophy  Skin: warm and dry, no rash Neuro:  Alert and Oriented x 3, Strength and sensation are intact Psych: euthymic mood, full affect no change  Continues with 2/6 systolic murmur.  Doing well.   EKG: 01/06/2018-sinus bradycardia 53 04/22/16-sinus bradycardia rate 58 with biatrial enlargement, T-wave  inversion noted in 1, aVL, subtle downsloping noted in V6. Note hypertrophic cardiomyopathy. Prior, sinus brady with T-wave changes same as current. Personally viewed.   Recent Labs: 10/26/2018: ALT 19; BUN 33; Creatinine, Ser 0.63; Hemoglobin 14.0; Platelets 326; Potassium 4.0; Sodium 143     Lipid Panel    Component Value Date/Time   CHOL 211 (H) 09/16/2016 1200   TRIG 78 09/16/2016 1200   HDL 97 09/16/2016 1200   CHOLHDL 2.2 09/16/2016 1200   LDLCALC 98 09/16/2016 1200  LDL last checked 03/19/17 108    Wt Readings from Last 3 Encounters:  06/30/19 111 lb (50.3 kg)  04/26/19 110 lb (49.9 kg)  03/15/19 110 lb (49.9 kg)      Other studies Reviewed: Additional studies/ records that were reviewed today include:  Hospital records reviewed, echocardiogram reviewed. Review of the above records demonstrates:  As above  ECHO 05/08/26: ------------------------------------------------------------------- Study Conclusions  - Left ventricle: The cavity size was normal. Wall thickness was   increased in a pattern of moderate LVH. There was severe focal   basal hypertrophy of the septum (measures 2.2 cm) consistent with   HOCM - the rest LVOT gradient is 23 mmHg. Systolic function was   vigorous. The estimated ejection fraction was in the range of 65%   to 70%. Wall motion was normal; there were no regional wall   motion abnormalities. Doppler parameters are consistent with   abnormal left ventricular relaxation (grade 1 diastolic   dysfunction). The E/e&' ratio is >25, suggesting markedly elevated   LV Filling pressure. - Aortic valve: Mildly calcified leaflets. Mild stenosis - the peak   aortic valve gradient is 42 mmHg. There was no regurgitation. - Mitral valve: Calcified annulus. SAM is noted. Mildly thickened   leaflets . There was moderate regurgitation. - Left atrium: The atrium was mildly dilated. - Tricuspid valve: There was mild regurgitation. - Pulmonary arteries: PA  peak pressure: 29 mm Hg (S). - Inferior vena cava: The vessel was normal in size. The   respirophasic diameter changes were in the normal range (>= 50%),   consistent with normal central venous pressure.  Impressions:  - Findings consistent with HOCM - proximal septum measures 2.2 cm,   SAM is noted, the rest LVOT gradient is 23 mmHg - the peak   continuous gradient across the aortic valve is 42 mmHg. The   aortic valve is not well-visualized but appears calcified and is   likely mildly stenotic. LVEF remains 70% or greater. LV filling   pressure is very high. There is at least moderate MR, mostly due   to systolic anterior motion [facility]) of the anterior mitral leaflet.   Valsalva was not performed.   ASSESSMENT AND PLAN:  74 year old female with hypertrophic obstructive cardiomyopathy, moderate coronary artery disease, nonflow limiting LAD lesion by FFR, peripheral vascular disease requiring femoropopliteal bypass, long-standing history of hyper vagal responses.    Hypertrophic obstructive cardiomyopathy  - Father on autopsy has hypertrophic heart  -  Avoid dehydration  -  At baseline, outflow tract is not significant  -  Intraoperative. With crossclamping, vasodilatation,   Increased adrenergic drive. Perfect storm to elucidate obstruction.She was intubated for several days postoperatively femoropopliteal and hemodynamically post significant troubles trying to extubate given her periodic outflow tract obstruction which was severe.  -  We will try to avoid all elective surgical procedures in the future.  -  No high-risk symptoms such as ventricular tachycardia, early cardiac death in family members , unexplained syncope , septum less than 3 cm. Murmur has been present at baseline.   -   Bystolic 2.5 QD (decreased heart rate , decreased inotropic beneficial in her situation) given her blood pressure that was highly elevated.  When she has tried higher dosing, she bottoms out too quickly  and this causes near syncope.   - She describes hypervagal-like experiences including nausea, vomiting, pallor, diaphoresis with subsequent diarrhea, feeling wiped out for hours. She has been battling this for years. Continue to avoid dehydration. She is drinking propel. She knows to lay down immediately if she begins to feel this.  - Now on midodrine PRN.  Sometimes she needs to take 2 of them to help.  - Thankfully the stress echocardiogram did not show any drops in blood pressure with exercise. She did not show any increased gradient during exercise on echocardiogram. She did however when performing Valsalva increase her gradient. Please see report. No ventricular arrhythmias.  - MRI did not show any evidence of SAM, minimal gradient.  -Blood pressure remains difficult to control.  I am willing to tolerate elevated blood pressures because her low blood pressure values can be severely reduced, 60/40 for instance.  -No changes made during this visit.   -Dyspnea-Prior extensive smoking history- PFTs/pulmonary evaluation, Severe obstruction, Dr. Delton Coombes. Stiolto - functioning better. Chest x-ray PA and lateral from 09/19/12 was interpreted as underlying COPD.     Sinus bradycardia   - She takes hydralazine if her blood pressure gets over 160 occasionally. She takes midodrine if it drops significantly but she has not taken this recently.  Currently her heart rate is 62 bpm on low-dose Bystolic.  No changes made.    Paroxysmal atrial fibrillation  -Had episode requiring cardioversion in June 2019.  Felt sluggish, weak, short of breath.  Successful cardioversion on propofol.  She did not have any hemodynamic instability.  -  Continuing with anticoagulation, Eliquis  -  No longer on amiodarone.  Now on dofetilide. Dr. Graciela Husbands has seen.   -  Postoperative,  seems to be maintaining sinus rhythm well.  Doing well.  Anxiety  - would be ok  to use low dose Xanax if needed. Dr. Barbaraann Barthel.  This was previously  discussed with Dr. Gala Romney.    Statin intolerance  -We tolerating the Zetia  Essential hypertension, labile hypertension -Bystolic 2.5, Cardizem 30 mg as needed for significant hypertension. Challenging.  Understands stroke risks however understands risks of cardiovascular collapse given her swings.  Disposition:   FU with Pia Jedlicka 6 months  Signed, Donato Schultz, MD  06/30/2019 2:28 PM    [facility] [address] [place], [place], [place]  [address] Phone: [phone]; Fax: [phone]

## 2019-06-30 NOTE — Patient Instructions (Signed)
Medication Instructions:   Your physician recommends that you continue on your current medications as directed. Please refer to the Current Medication list given to you today.  *If you need a refill on your cardiac medications before your next appointment, please call your pharmacy*    Follow-Up: At [facility], you and your health needs are our priority.  As part of our continuing mission to provide you with exceptional heart care, we have created designated Provider Care Teams.  These Care Teams include your primary Cardiologist (physician) and Advanced Practice Providers (APPs -  Physician Assistants and Nurse Practitioners) who all work together to provide you with the care you need, when you need it.  Your next appointment:   6 month(s)  The format for your next appointment:   In Person  Provider:   Mark Skains, MD    

## 2019-07-01 ENCOUNTER — Telehealth: Payer: Self-pay | Admitting: *Deleted

## 2019-07-01 ENCOUNTER — Other Ambulatory Visit: Payer: Self-pay | Admitting: Internal Medicine

## 2019-07-01 MED ORDER — MIDODRINE HCL 10 MG PO TABS: 10.0000 mg | ORAL_TABLET | Freq: Three times a day (TID) | ORAL | 0 refills | Status: DC | PRN

## 2019-07-01 NOTE — Telephone Encounter (Signed)
Patient called in to get refill Midodrine 10 mg up tp 3 times daily as needed

## 2019-07-02 ENCOUNTER — Other Ambulatory Visit: Payer: Self-pay | Admitting: Cardiology

## 2019-07-02 MED ORDER — DILTIAZEM HCL 30 MG PO TABS: 30.0000 mg | ORAL_TABLET | Freq: Every day | ORAL | 3 refills | Status: DC | PRN

## 2019-08-31 ENCOUNTER — Other Ambulatory Visit: Payer: Self-pay | Admitting: Cardiology

## 2019-09-03 ENCOUNTER — Telehealth: Payer: Self-pay | Admitting: Emergency Medicine

## 2019-09-03 NOTE — Telephone Encounter (Signed)
Spoke with Beverly Li at [facility]. States that Baxter Hire is wanting to know RB would okay with the pt taking Flovent daily. Pt has benefited from the inhaler would like to continue it.  RB - please advise. Thanks.

## 2019-09-03 NOTE — Telephone Encounter (Signed)
LMTCB x 1 for [employer]

## 2019-09-03 NOTE — Telephone Encounter (Signed)
Yes, this is ok. We can discuss at her next OV, decide whether we want to continue an ICS. If so then we may be able to consolidate with an alternative (Trelegy or breztri, etc)

## 2019-09-06 MED ORDER — FLOVENT HFA 44 MCG/ACT IN AERO: 2.0000 | INHALATION_SPRAY | Freq: Two times a day (BID) | RESPIRATORY_TRACT | 3 refills | Status: DC

## 2019-09-06 NOTE — Telephone Encounter (Signed)
ATC Kristen X2 LMTCB

## 2019-09-06 NOTE — Telephone Encounter (Signed)
Baxter Hire called back, aware of recs.  Baxter Hire has asked our office to send in this rx since RB has ok'ed her to continue taking it.  rx sent to pharmacy.  Nothing further needed at this time- will close encounter.

## 2019-09-29 ENCOUNTER — Other Ambulatory Visit: Payer: Self-pay

## 2019-09-29 MED ORDER — POTASSIUM CHLORIDE CRYS ER 10 MEQ PO TBCR: 10.0000 meq | EXTENDED_RELEASE_TABLET | Freq: Every day | ORAL | 6 refills | Status: DC

## 2019-11-06 ENCOUNTER — Other Ambulatory Visit: Payer: Self-pay | Admitting: Internal Medicine

## 2019-11-08 ENCOUNTER — Other Ambulatory Visit: Payer: Self-pay

## 2019-11-08 MED ORDER — NEBIVOLOL HCL 2.5 MG PO TABS: 2.5000 mg | ORAL_TABLET | Freq: Every day | ORAL | 1 refills | Status: DC

## 2019-11-08 NOTE — Telephone Encounter (Signed)
Called and spoke to pt. Pt's next appointment is with Dr. Graciela Husbands in September. Will put on appointment note for pt to get blood work and made pt aware. Will refill medication so pt has enough to get her to appointment.

## 2019-11-08 NOTE — Telephone Encounter (Signed)
Prescription refill request for Eliquis received.  Last office visit: Skains, 06/30/2019 Scr: 0.63, 10/26/2018 Age: 74 y.o. Weight: 50.3 kg   Pt is overdue for blood work.

## 2019-11-29 ENCOUNTER — Other Ambulatory Visit: Payer: Self-pay | Admitting: Cardiology

## 2019-11-30 ENCOUNTER — Other Ambulatory Visit: Payer: Self-pay | Admitting: Cardiology

## 2019-12-25 DIAGNOSIS — R001 Bradycardia, unspecified: Secondary | ICD-10-CM | POA: Insufficient documentation

## 2019-12-27 ENCOUNTER — Encounter: Payer: Self-pay | Admitting: Internal Medicine

## 2019-12-27 ENCOUNTER — Ambulatory Visit: Payer: Medicare Other | Admitting: Internal Medicine

## 2019-12-27 ENCOUNTER — Other Ambulatory Visit: Payer: Self-pay

## 2019-12-27 VITALS — BP 160/106 | HR 65 | Ht 64.0 in | Wt 112.4 lb

## 2019-12-27 DIAGNOSIS — R001 Bradycardia, unspecified: Secondary | ICD-10-CM

## 2019-12-27 DIAGNOSIS — I480 Paroxysmal atrial fibrillation: Secondary | ICD-10-CM

## 2019-12-27 DIAGNOSIS — I421 Obstructive hypertrophic cardiomyopathy: Secondary | ICD-10-CM

## 2019-12-27 DIAGNOSIS — Z79899 Other long term (current) drug therapy: Secondary | ICD-10-CM

## 2019-12-27 LAB — CBC
Hematocrit: 50.1 % — ABNORMAL HIGH (ref 34.0–46.6)
Hemoglobin: 16.8 g/dL — ABNORMAL HIGH (ref 11.1–15.9)
MCH: 30.1 pg (ref 26.6–33.0)
MCHC: 33.5 g/dL (ref 31.5–35.7)
MCV: 90 fL (ref 79–97)
Platelets: 278 10*3/uL (ref 150–450)
RBC: 5.58 x10E6/uL — ABNORMAL HIGH (ref 3.77–5.28)
RDW: 13.3 % (ref 11.7–15.4)
WBC: 11 10*3/uL — ABNORMAL HIGH (ref 3.4–10.8)

## 2019-12-27 LAB — BASIC METABOLIC PANEL
BUN/Creatinine Ratio: 32 — ABNORMAL HIGH (ref 12–28)
BUN: 23 mg/dL (ref 8–27)
CO2: 24 mmol/L (ref 20–29)
Calcium: 9.4 mg/dL (ref 8.7–10.3)
Chloride: 101 mmol/L (ref 96–106)
Creatinine, Ser: 0.72 mg/dL (ref 0.57–1.00)
GFR calc Af Amer: 95 mL/min/{1.73_m2} (ref >59)
GFR calc non Af Amer: 83 mL/min/{1.73_m2} (ref >59)
Glucose: 102 mg/dL — ABNORMAL HIGH (ref 65–99)
Potassium: 4.7 mmol/L (ref 3.5–5.2)
Sodium: 139 mmol/L (ref 134–144)

## 2019-12-27 LAB — MAGNESIUM: Magnesium: 2 mg/dL (ref 1.6–2.3)

## 2019-12-27 NOTE — Progress Notes (Signed)
Patient Care Team: Rankins, Fanny Dance, MD as PCP - General (Family Medicine) [name], MD as PCP - Cardiology (Cardiology) Shirlean Kelly, MD as Consulting Physician (Neurosurgery)   HPI  Beverly Li is a 74 y.o. female   Seen in follow-up for partially obstructive hypertrophic cardiomyopathy.  Has a history of labile hypertension and during vascular procedure 11/16 she developed hypotension and suffered a respiratory arrest.  She underwent intubation and phenylephrine infusion.    1/18 she underwent catheterization for chest pain.  No coronary disease was noted but she again had significant postprocedural hypertension and received IV hydralazine.  She became profoundly bradycardic and hypotensive again requiring Neo-Synephrine.  She developed respiratory distress but did not require intubation.  Also has atrial fibrillation taking dofetilide   On Eliquis without bleeding.  A couple of episodes of low blood pressure (100 or so) and fast heart rate 120 or so albeit a regular over the last year.  No other clear atrial fibrillation.     Date Cr K Hgb  7/20 0.63 4.0 14.0         DATE TEST EF   4/18 cMRI   % LGE  septum 21  "spade like"  9/20 Echo   60-65%  Severe LVH (17/14)  LAE (43 cc/m2)          Records and Results Reviewed   Past Medical History:  Diagnosis Date  . Anxiety   . Arthritis    "some in my lower back; probably elbows, knees" (11/18/2017)  . Atrial fibrillation (HCC)   . Bell's palsy    when pt. was 74 yrs old, when under stress the left side of face will droop.  . Complication of anesthesia    "vascular OR 2016; BP bottomed out; couldn't get it regulated; ended up in ICU for DAYS" (11/18/2017)  . GERD (gastroesophageal reflux disease)   . Heart murmur   . History of kidney stones   . Hypertension   . Hypertrophic cardiomyopathy (HCC)    severe LV basilar hypertrophy witn no evidence of significant outflow tract obstruction, EF 65-70%,  mild LAE, mild TR, grade 1a diastolic dysfunction 05/15/10 (Dr. Donato Schultz) (Atrial Septal Hypertrophy pattern)-- Intra-op TEE with dsignificant outflow tract obstruction - AI, MR & TR  . Insomnia   . Mild aortic sclerosis   . Osteopenia   . Peripheral vascular disease (HCC)   . Syncope    , Vagal    Past Surgical History:  Procedure Laterality Date  . AUGMENTATION MAMMAPLASTY Bilateral   . BACK SURGERY    . CARDIAC CATHETERIZATION N/A 05/07/2016   Procedure: Left Heart Cath and Coronary Angiography;  Surgeon: Kathleene Hazel, MD;  Location: [facility] INVASIVE CV LAB;  Service: Cardiovascular;  Laterality: N/A;  . CARDIOVERSION N/A 09/24/2017   Procedure: CARDIOVERSION;  Surgeon: Laurey Morale, MD;  Location: [facility] ENDOSCOPY;  Service: Cardiovascular;  Laterality: N/A;  . DILATION AND CURETTAGE OF UTERUS    . ENDARTERECTOMY FEMORAL Right 03/02/2015   Procedure: ENDARTERECTOMY RIGHT FEMORAL;  Surgeon: Chuck Hint, MD;  Location: [facility] OR;  Service: Vascular;  Laterality: Right;  . FACIAL COSMETIC SURGERY Left 2002   "related to Bell's Palsy @ age 44; left eye/side of face droopy; tried to make area symmetrical"  . FEMORAL-POPLITEAL BYPASS GRAFT Right 03/02/2015   Procedure: BYPASS GRAFT FEMORAL-BELOW KNEE POPLITEAL ARTERY;  Surgeon: Chuck Hint, MD;  Location: [facility] OR;  Service: Vascular;  Laterality: Right;  .  INGUINAL HERNIA REPAIR Bilateral 2002  . OVARIAN CYST REMOVAL Left   . PERIPHERAL VASCULAR CATHETERIZATION N/A 01/16/2015   Procedure: Abdominal Aortogram;  Surgeon: Chuck Hint, MD;  Location: [facility] INVASIVE CV LAB;  Service: Cardiovascular;  Laterality: N/A;  . POSTERIOR LUMBAR FUSION  2015   "have plates and screws in there"  . TONSILLECTOMY      Current Meds  Medication Sig  . acetaminophen (TYLENOL) 500 MG tablet Take 1,500 mg by mouth every 6 (six) hours as needed (for pain.).  [name] alendronate (FOSAMAX) 70 MG tablet Take 70 mg by mouth every Monday.  Take with a full glass of water on an empty stomach.  [name] buPROPion (WELLBUTRIN XL) 150 MG 24 hr tablet Take 150 mg by mouth every morning.   . Calcium-Phosphorus-Vitamin D (CALCIUM/D3 ADULT GUMMIES PO) Take 2 tablets by mouth daily at 2 PM.  . DEXILANT 30 MG capsule Take 1 capsule by mouth daily.  [name] diltiazem (CARDIZEM) 30 MG tablet Take 1 tablet (30 mg total) by mouth daily as needed (blood pressure higher than 160).  . dofetilide (TIKOSYN) 250 MCG capsule TAKE 1 CAPSULE(250 MCG) BY MOUTH TWICE DAILY  . ELIQUIS 5 MG TABS tablet TAKE 1 TABLET(5 MG) BY MOUTH TWICE DAILY  . ezetimibe (ZETIA) 10 MG tablet TAKE 1 TABLET(10 MG) BY MOUTH DAILY  . fluticasone (FLOVENT HFA) 44 MCG/ACT inhaler Inhale 2 puffs into the lungs in the morning and at bedtime.  . hydrALAZINE (APRESOLINE) 25 MG tablet TAKE 1 TABLET BY MOUTH EVERY 8 HOURS AS NEEDED FOR SYSTOLIC BLOOD PRESSURE GREATER THAN 160  . midodrine (PROAMATINE) 10 MG tablet TAKE 1 TABLET BY MOUTH THREE TIMES DAILY WITH MEALS AS NEEDED FOR BLOD PRESSURE LOWER THAN 100(SYSTOLIC)  . Multiple Vitamins-Iron (STRESS B COMPLEX/IRON) TABS Take 1 tablet by mouth at bedtime.   . nebivolol (BYSTOLIC) 2.5 MG tablet Take 1 tablet (2.5 mg total) by mouth daily.  . potassium chloride (KLOR-CON) 10 MEQ tablet Take 1 tablet (10 mEq total) by mouth daily.  [name] PROAIR HFA 108 (90 Base) MCG/ACT inhaler Inhale 2 puffs into the lungs 4 (four) times daily as needed (for wheezing/shortness of breath.).   . Tiotropium Bromide-Olodaterol (STIOLTO RESPIMAT) 2.5-2.5 MCG/ACT AERS Inhale 2 puffs into the lungs daily.  [name] zolpidem (AMBIEN) 10 MG tablet Take 10 mg by mouth at bedtime as needed.    Allergies  Allergen Reactions  . Amoxicillin Other (See Comments)    UTI Has patient had a PCN reaction causing immediate rash, facial/tongue/throat swelling, SOB or lightheadedness with hypotension: No Has patient had a PCN reaction causing severe rash involving mucus membranes or skin  necrosis: No Has patient had a PCN reaction that required hospitalization: No Has patient had a PCN reaction occurring within the last 10 years: Yes--UTI ONLY If all of the above answers are "NO", then may proceed with Cephalosporin use.   . Atenolol Cough  . Crestor [Rosuvastatin Calcium] Other (See Comments)    Muscle aches  . Pravastatin Other (See Comments)    Muscle aches  . Sulfa Antibiotics Nausea Only  . Codeine     hallucinations      Review of Systems negative except from HPI and PMH  Physical Exam BP (!) 160/106   Pulse 65   Ht 5\' 4"  (1.626 m)   Wt 112 lb 6.4 oz (51 kg)   LMP  (LMP Unknown)   SpO2 92%   BMI 19.29 kg/m  Well developed and nourished in no  acute distress HENT normal Neck supple with JVP-  flat   Clear Regular rate and rhythm, 3/6 scratchy murmur at the left lower sternal border Abd-soft with active BS No Clubbing cyanosis edema Skin-warm and dry A & Oriented  Grossly normal sensory and motor function  ECG sinus @ 65 21/08/46 IACD ST depression V6 and T wave inversions 1 and aVL    Assessment and  Plan  Hypertrophic obstructive cardiomyopathy  Sinus bradycardia  Hypertension   Vasovagal events    Tolerating dofetilide.  Needs surveillance laboratories.  ECG within range.  Continue Eliquis.  No significant bleeding.  We will check her hemoglobin.  Continues to use midodrine and hydralazine as needed.  We will continue to use it in an escalating way given her propensity in the past to profound hypotension and hemodynamic collapse       Current medicines are reviewed at length with the patient today .  The patient does not  have concerns regarding medicines.

## 2019-12-27 NOTE — Patient Instructions (Signed)
Medication Instructions:  Your physician recommends that you continue on your current medications as directed. Please refer to the Current Medication list given to you today.  *If you need a refill on your cardiac medications before your next appointment, please call your pharmacy*   Lab Work: CBC, BMP and Mg today If you have labs (blood work) drawn today and your tests are completely normal, you will receive your results only by: [name] MyChart Message (if you have MyChart) OR . A paper copy in the mail If you have any lab test that is abnormal or we need to change your treatment, we will call you to review the results.   Testing/Procedures: None ordered.    Follow-Up: At [facility], you and your health needs are our priority.  As part of our continuing mission to provide you with exceptional heart care, we have created designated Provider Care Teams.  These Care Teams include your primary Cardiologist (physician) and Advanced Practice Providers (APPs -  Physician Assistants and Nurse Practitioners) who all work together to provide you with the care you need, when you need it.  We recommend signing up for the patient portal called "MyChart".  Sign up information is provided on this After Visit Summary.  MyChart is used to connect with patients for Virtual Visits (Telemedicine).  Patients are able to view lab/test results, encounter notes, upcoming appointments, etc.  Non-urgent messages can be sent to your provider as well.   To learn more about what you can do with MyChart, go to [email or web address].    Your next appointment:   12 month(s)  The format for your next appointment:   In Person  Provider:   Sherryl Manges, MD

## 2019-12-29 ENCOUNTER — Telehealth: Payer: Self-pay | Admitting: Internal Medicine

## 2019-12-29 NOTE — Telephone Encounter (Signed)
Pt is returning phone call regarding her results. Please call back

## 2019-12-29 NOTE — Telephone Encounter (Signed)
Duke Salvia, MD  12/28/2019 7:53 PM EDT Back to Top    Please Inform Patient that labs are normal  X HGB and WBC is increased  this should be repeated within the month by PCP  Thanks   The patient has been notified of the result and verbalized understanding.  All questions (if any) were answered. Ethelda Chick, RN 12/29/2019 4:09 PM   Will forward copy to patient's PCP.

## 2019-12-30 ENCOUNTER — Telehealth: Payer: Self-pay

## 2019-12-30 NOTE — Telephone Encounter (Signed)
-----   Message from Duke Salvia, MD sent at 12/28/2019  7:53 PM EDT ----- Please Inform Patient that labs are normal  X HGB and WBC is increased   this should be repeated within the month by PCP  Thanks

## 2019-12-30 NOTE — Telephone Encounter (Signed)
Spoke with pt and advised per Dr Graciela Husbands labs are normal with exception of elevated HGB and WBC.  Dr Graciela Husbands would like for pt to have labs redrawn by PCP in one month.  Pt verbalizes understanding and agrees with current plan.

## 2020-01-24 ENCOUNTER — Telehealth: Payer: Self-pay | Admitting: Cardiology

## 2020-01-24 NOTE — Telephone Encounter (Signed)
Spoke with patient and reviewed orders by Dr Anne Fu.  She states understanding.  appt scheduled at 8:40 AM.  She is aware and agreeable.

## 2020-01-24 NOTE — Telephone Encounter (Signed)
Could be back in atrial fibrillation.  Hopefully her evening dose of dofetilide will help with this.  In the meantime, have her take the diltiazem short acting 30 mg x 1.  Lets have her come into clinic tomorrow at 840.  Donato Schultz, MD

## 2020-01-24 NOTE — Telephone Encounter (Signed)
Patient was having some out of the ordinary symptoms she wanted to talk to [facility] about

## 2020-01-24 NOTE — Telephone Encounter (Signed)
Spoke with patient who is concerned because since about 2 am she has had a BP that is really close together and a elevated HR.  According to her Apple watch she is in At Fib.  BPs and HR have been as follows: 128/104 98 132/94  114 117/98  110 116/94  118.  She reports not feeling sick but her hands and feet feel swollen.  Feels alittle out of sorts and is concerned because she is alone (Husband oot at the beach)  Advised I will have Dr Anne Fu review and call her back.

## 2020-01-25 ENCOUNTER — Encounter: Payer: Self-pay | Admitting: Cardiology

## 2020-01-25 ENCOUNTER — Ambulatory Visit: Payer: Medicare Other | Admitting: Cardiology

## 2020-01-25 ENCOUNTER — Other Ambulatory Visit: Payer: Self-pay

## 2020-01-25 VITALS — BP 160/90 | HR 55 | Ht 64.0 in | Wt 115.0 lb

## 2020-01-25 DIAGNOSIS — I480 Paroxysmal atrial fibrillation: Secondary | ICD-10-CM

## 2020-01-25 DIAGNOSIS — I421 Obstructive hypertrophic cardiomyopathy: Secondary | ICD-10-CM | POA: Diagnosis not present

## 2020-01-25 LAB — BASIC METABOLIC PANEL
BUN/Creatinine Ratio: 29 — ABNORMAL HIGH (ref 12–28)
BUN: 29 mg/dL — ABNORMAL HIGH (ref 8–27)
CO2: 28 mmol/L (ref 20–29)
Calcium: 9.5 mg/dL (ref 8.7–10.3)
Chloride: 101 mmol/L (ref 96–106)
Creatinine, Ser: 0.99 mg/dL (ref 0.57–1.00)
GFR calc Af Amer: 65 mL/min/{1.73_m2} (ref >59)
GFR calc non Af Amer: 56 mL/min/{1.73_m2} — ABNORMAL LOW (ref >59)
Glucose: 91 mg/dL (ref 65–99)
Potassium: 4.8 mmol/L (ref 3.5–5.2)
Sodium: 140 mmol/L (ref 134–144)

## 2020-01-25 LAB — CBC
Hematocrit: 50 % — ABNORMAL HIGH (ref 34.0–46.6)
Hemoglobin: 17.2 g/dL — ABNORMAL HIGH (ref 11.1–15.9)
MCH: 30.9 pg (ref 26.6–33.0)
MCHC: 34.4 g/dL (ref 31.5–35.7)
MCV: 90 fL (ref 79–97)
Platelets: 270 10*3/uL (ref 150–450)
RBC: 5.56 x10E6/uL — ABNORMAL HIGH (ref 3.77–5.28)
RDW: 13.2 % (ref 11.7–15.4)
WBC: 9.6 10*3/uL (ref 3.4–10.8)

## 2020-01-25 NOTE — Progress Notes (Signed)
Cardiology Office Note:    Date:  01/25/2020   ID:  Beverly Li, DOB 1945-05-07, MRN [id number]  PCP:  Beverly Heron, MD  [facility] Cardiologist:  Beverly Schultz, MD  [facility] Electrophysiologist:  None   Referring MD: Beverly Heron, MD     History of Present Illness:    Beverly Li is a 74 y.o. female here for evaluation of heart arrhythmia, prior evidence of atrial fibrillation/flutter, Dr. Graciela Li, Tikosyn, prior cardioversion with underlying hypertrophic obstructive cardiomyopathy with prior severe swings in blood pressure, in part vagally mediated.  She called over to the office after feeling her heart rate in the 110 range.  She was feeling a bit more shortness of breath, more fatigue.  Her husband is out of town at [employer].  Via telephone message she was instructed to take an additional short acting diltiazem, evening Tikosyn and to come in the office this morning for further evaluation.  She recently saw Dr. Graciela Li with electrophysiology on 12/27/2019.  Office note reviewed.  At that time she noted a couple episodes of low blood pressure 100 or so and fast heart rate in the 120s rarely over the last year.  He stated that she continues to use midodrine and hydralazine as needed.  Today, she states that she feels better.  Probably had an episode of paroxysmal atrial fibrillation yesterday with the rapid heartbeat. Took diltiazem 30 PRN and felt better.   Feels rapid HR every 2 months. April, July and yesterday. Wakes her up. Uncomfortable, little nausea, racing.   Prior office visit review pulled forward given the complexity of her history:  hypertrophic obstructive cardiomyopathy, recent severe exacerbation again post cardiac catheterization and prior during femoropopliteal bypass resulting in  phenylephrine , fluids and prolonged intubation here for follow-up.  She underwent diagnostic heart catheterization on 05/07/16 and post procedure developed  severe hypotension, hyper vagal response. Requiring once again phenylephrine, facemask oxygen. She did not require intubation. 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 Hydralazine was given post catheterization wound systolic pressure pressure was 190. Profound hypotension and bradycardia noted. This was treated rapidly, fluids, phenylephrine, heart rates as low as the 40s.  Midodrine started. Phenylephrine eventually weaned off. Diuresis noted. Klonopin was started for anxiety, to hopefully help blunt the hyper adrenergic response. She denies being atypically anxious person however.  Echocardiogram 04/2016 showed EF 65-70% with hypertrophic obstructive cardiomyopathy with LVOT gradient of 23 mm at rest.  She is here for follow-up today and should have appointment set up with Dr. Graciela Li to discuss her profound autonomic dysfunction.  In 2014 started having "heart burn" episodes. Debilitating. Out walking dog. By time she would get back, sweat, diahpesis, up neck, nausea then diarrhea. Wiped out for a few hours. Does not feel it now with exercise. Felt 3 hours irreg hb afib kicked in. Afaid to go anywhere. The sweat comes over her hot. Pain. After she described this once again, this is why I proceeded with cardiac catheterization to make sure that with her peripheral vascular disease that she did not have severe coronary artery disease that was now becoming progressive. Thankfully, she did not have flow limiting disease. Moderate LAD, FFR.  Shortly after cardiac catheterization when her blood pressure increased to the 190 systolic range, she was given hydralazine to reduce her blood pressure and she ended up having a hyper vagal response where her heart rate decreases the 40s, blood pressure plummeted into the 50  systolic, she developed flash pulmonary edema. She was placed in the CCU, phenylephrine. Slowly, this was able to be weaned. Klonopin and Midodrin was  started.  This episode post catheterization was very similar to her episode intraoperatively during her femoropopliteal bypass. She was intubated for several days after that procedure and with her arterial line demonstrated classic spike and dome changes intermittently with marked variations in her blood pressure at various points.  09/16/16-since her last visit she has had cardiac MRI and stress echocardiogram. Both were reassuring. She did not drop her blood pressures during exercise. She did not increase her gradient significantly during exercise. We were able to invoke an increase left ventricular output tract gradient with Valsalva maneuver. Please see report. No ventricular arrhythmias. She states that over the last 3 months she's felt the best that she's felt in quite some time. She is drinking sports drinks quite frequently, propel. When she does have low blood pressure usually has a prodrome, very vagal-like response as noted previously.  03/31/17-she had a fall in [place] when walking on cobblestones in a garden with flip-flops during a house store with her girlfriends.  She states that she broke her fall with her lips.  She also broke her arm.  Dr. Cleophas Dunker.  She has not had any severe cardiac symptoms thankfully.  At one point during the last several months she did have low blood pressure and she took the Midrin which took effect after approximately 3 hours.  She has been drinking propel.  Doing very well.  06/26/17 - 2 episodes with crashed BP, out for the day. Midodrine then. 185/110 at home.  She took a hydralazine at home and now her blood pressure is 158/90.  We once again went over how challenging it is for her given her hypertrophic cardiomyopathy and previous severe hypotension.  Currently she is not have any chest pain, no strokelike symptoms.  No fevers chills nausea vomiting.  10/28/2017- cardioversion took place in June 2019 setting of 2-1 atrial flutter with rapid ventricular  response for approximately 4 days causing symptoms.  Dr. Shirlee Latch performed cardioversion with Dr. Jean Rosenthal as anesthesiologist.  Dr. Graciela Li saw her in clinic prior to conversion.  Overall she has been doing quite well.  She did have one other episode at the beach lasting approximately 2 or 3 hours at a heart rate that she says was 125 to 130 bpm.  No syncope, no bleeding, no orthopnea, no PND.  12/28/18 -chief complaint: She has been experiencing more shortness of breath.  She dealt with discitis and prolonged IV antibiotic course.  Fatigue.  After long discussion with infectious disease, we did not pursue invasive biopsy because of problems with anesthesia in the past. COVID neg. Rely on albuterol. Helps. No syncope in 6 months. Tikosyn has worked well. Phelem.  Has lost quite a bit of weight during this infection.  Was depressed for a little while.  Starting to come back she states.  Likely, over the last 6 months she is not had any drops in her blood pressure.  06/30/2019 -Here for follow-up of hypertrophic cardiomyopathy.  Overall doing quite well.  Received both Covid vaccines.  Impressed with the vaccination process at the [facility].  No syncope no chest pain.  Dr. Odessa Fleming note reviewed see below.  Pulmonary, Dr. Kavin Leech note reviewed.  Severe obstruction noted on PFT.  She is now taking the inhaler Stiolto.  Interestingly, she was going through paperwork found her father's autopsy printed on onion paper.  She  stated that hypertrophic heart was listed on his report.    Past Medical History:  Diagnosis Date   Anxiety    Arthritis    "some in my lower back; probably elbows, knees" (11/18/2017)   Atrial fibrillation (HCC)    Bell's palsy    when pt. was 74 yrs old, when under stress the left side of face will droop.   Complication of anesthesia    "vascular OR 2016; BP bottomed out; couldn't get it regulated; ended up in ICU for DAYS" (11/18/2017)   GERD (gastroesophageal reflux disease)     Heart murmur    History of kidney stones    Hypertension    Hypertrophic cardiomyopathy (HCC)    severe LV basilar hypertrophy witn no evidence of significant outflow tract obstruction, EF 65-70%, mild LAE, mild TR, grade 1a diastolic dysfunction 05/15/10 (Dr. Donato SchultzMark Betzaida Cremeens) (Atrial Septal Hypertrophy pattern)-- Intra-op TEE with dsignificant outflow tract obstruction - AI, MR & TR   Insomnia    Mild aortic sclerosis    Osteopenia    Peripheral vascular disease (HCC)    Syncope    , Vagal    Past Surgical History:  Procedure Laterality Date   AUGMENTATION MAMMAPLASTY Bilateral    BACK SURGERY     CARDIAC CATHETERIZATION N/A 05/07/2016   Procedure: Left Heart Cath and Coronary Angiography;  Surgeon: Kathleene Hazelhristopher D McAlhany, MD;  [facility];  Service: Cardiovascular;  Laterality: N/A;   CARDIOVERSION N/A 09/24/2017   Procedure: CARDIOVERSION;  Surgeon: Laurey MoraleMcLean, Dalton S, MD;  [facility];  Service: Cardiovascular;  Laterality: N/A;   DILATION AND CURETTAGE OF UTERUS     ENDARTERECTOMY FEMORAL Right 03/02/2015   Procedure: ENDARTERECTOMY RIGHT FEMORAL;  Surgeon: Chuck Hinthristopher S Dickson, MD;  [facility];  Service: Vascular;  Laterality: Right;   FACIAL COSMETIC SURGERY Left 2002   "related to Bell's Palsy @ age 74; left eye/side of face droopy; tried to make area symmetrical"   FEMORAL-POPLITEAL BYPASS GRAFT Right 03/02/2015   Procedure: BYPASS GRAFT FEMORAL-BELOW KNEE POPLITEAL ARTERY;  Surgeon: Chuck Hinthristopher S Dickson, MD;  [facility];  Service: Vascular;  Laterality: Right;   INGUINAL HERNIA REPAIR Bilateral 2002   OVARIAN CYST REMOVAL Left    PERIPHERAL VASCULAR CATHETERIZATION N/A 01/16/2015   Procedure: Abdominal Aortogram;  Surgeon: Chuck Hinthristopher S Dickson, MD;  [facility];  Service: Cardiovascular;  Laterality: N/A;   POSTERIOR LUMBAR FUSION  2015   "have plates and screws in there"   TONSILLECTOMY      Current  Medications: Current Meds  Medication Sig   acetaminophen (TYLENOL) 500 MG tablet Take 1,500 mg by mouth every 6 (six) hours as needed (for pain.).   alendronate (FOSAMAX) 70 MG tablet Take 70 mg by mouth every Monday. Take with a full glass of water on an empty stomach.   buPROPion (WELLBUTRIN XL) 150 MG 24 hr tablet Take 150 mg by mouth every morning.    Calcium-Phosphorus-Vitamin D (CALCIUM/D3 ADULT GUMMIES PO) Take 2 tablets by mouth daily at 2 PM.   DEXILANT 30 MG capsule Take 1 capsule by mouth daily.   diltiazem (CARDIZEM) 30 MG tablet Take 1 tablet (30 mg total) by mouth daily as needed (blood pressure higher than 160).   dofetilide (TIKOSYN) 250 MCG capsule TAKE 1 CAPSULE(250 MCG) BY MOUTH TWICE DAILY   ELIQUIS 5 MG TABS tablet TAKE 1 TABLET(5 MG) BY MOUTH TWICE DAILY   ezetimibe (ZETIA) 10 MG tablet TAKE 1 TABLET(10 MG)  BY MOUTH DAILY   fluticasone (FLOVENT HFA) 44 MCG/ACT inhaler Inhale 2 puffs into the lungs in the morning and at bedtime.   hydrALAZINE (APRESOLINE) 25 MG tablet TAKE 1 TABLET BY MOUTH EVERY 8 HOURS AS NEEDED FOR SYSTOLIC BLOOD PRESSURE GREATER THAN 160   midodrine (PROAMATINE) 10 MG tablet TAKE 1 TABLET BY MOUTH THREE TIMES DAILY WITH MEALS AS NEEDED FOR BLOD PRESSURE LOWER THAN 100(SYSTOLIC)   Multiple Vitamins-Iron (STRESS B COMPLEX/IRON) TABS Take 1 tablet by mouth at bedtime.    nebivolol (BYSTOLIC) 2.5 MG tablet Take 1 tablet (2.5 mg total) by mouth daily.   potassium chloride (KLOR-CON) 10 MEQ tablet Take 1 tablet (10 mEq total) by mouth daily.   PROAIR HFA 108 (90 Base) MCG/ACT inhaler Inhale 2 puffs into the lungs 4 (four) times daily as needed (for wheezing/shortness of breath.).    Tiotropium Bromide-Olodaterol (STIOLTO RESPIMAT) 2.5-2.5 MCG/ACT AERS Inhale 2 puffs into the lungs daily.   zolpidem (AMBIEN) 10 MG tablet Take 10 mg by mouth at bedtime as needed.     Allergies:   Amoxicillin, Atenolol, Crestor [rosuvastatin calcium],  Pravastatin, Sulfa antibiotics, and Codeine   Social History   Socioeconomic History   Marital status: Married    Spouse name: Not on file   Number of children: Not on file   Years of education: Not on file   Highest education level: Not on file  Occupational History   Not on file  Tobacco Use   Smoking status: Former Smoker    Packs/day: 1.00    Years: 50.00    Pack years: 50.00    Types: Cigarettes    Quit date: 12/15/2014    Years since quitting: 5.1   Smokeless tobacco: Never Used  Vaping Use   Vaping Use: Never used  Substance and Sexual Activity   Alcohol use: Yes    Comment: 11/18/2017 "might have a couple glasses of wine/month; if that"   Drug use: Never   Sexual activity: Not Currently  Other Topics Concern   Not on file  Social History Narrative   Not on file   Social Determinants of Health   Financial Resource Strain:    Difficulty of Paying Living Expenses: Not on file  Food Insecurity:    Worried About Programme researcher, broadcasting/film/video in the Last Year: Not on file   [employer] of Food in the Last Year: Not on file  Transportation Needs:    Lack of Transportation (Medical): Not on file   Lack of Transportation (Non-Medical): Not on file  Physical Activity:    Days of Exercise per Week: Not on file   Minutes of Exercise per Session: Not on file  Stress:    Feeling of Stress : Not on file  Social Connections:    Frequency of Communication with Friends and Family: Not on file   Frequency of Social Gatherings with Friends and Family: Not on file   Attends Religious Services: Not on file   Active Member of Clubs or Organizations: Not on file   Attends Banker Meetings: Not on file   Marital Status: Not on file     Family History: The patient's family history includes Breast cancer in her sister; Cancer in her father, mother, and sister; Hypertension in her mother; Liver cancer in her mother; Lung cancer in her father.  ROS:    Please see the history of present illness.     All other systems reviewed and are negative.  EKGs/Labs/Other  Studies Reviewed:    The following studies were reviewed today:  Echo-hypertrophic obstructive cardiomyopathy, 2.2 cm septum, Sam, at least moderate MR from Sam.  LVOT gradient 23 mmHg  EKG:  EKG is  ordered today.  The ekg ordered today demonstrates sinus bradycardia 55 with QTC 434  Recent Labs: 12/27/2019: BUN 23; Creatinine, Ser 0.72; Hemoglobin 16.8; Magnesium 2.0; Platelets 278; Potassium 4.7; Sodium 139  Recent Lipid Panel    Component Value Date/Time   CHOL 211 (H) 09/16/2016 1200   TRIG 78 09/16/2016 1200   HDL 97 09/16/2016 1200   CHOLHDL 2.2 09/16/2016 1200   LDLCALC 98 09/16/2016 1200     Risk Assessment/Calculations:     CHA2DS2-VASc Score = 5  This indicates a 7.2% annual risk of stroke. The patient's score is based upon: CHF History: 1 HTN History: 1 Diabetes History: 0 Stroke History: 0 Vascular Disease History: 1 Age Score: 1 Gender Score: 1      Physical Exam:    VS:  BP (!) 160/90    Pulse (!) 55    Ht  (1.626 m)    Wt 115 lb (52.2 kg)    LMP  (LMP Unknown)    SpO2 94%    BMI 19.74 kg/m     Wt Readings from Last 3 Encounters:  01/25/20 115 lb (52.2 kg)  12/27/19 112 lb 6.4 oz (51 kg)  06/30/19 111 lb (50.3 kg)     GEN:  Well nourished, well developed in no acute distress HEENT: Normal NECK: No JVD; No carotid bruits LYMPHATICS: No lymphadenopathy CARDIAC: RRR, systolic murmur, no rubs, gallops RESPIRATORY:  Clear to auscultation without rales, wheezing or rhonchi  ABDOMEN: Soft, non-tender, non-distended MUSCULOSKELETAL:  No edema; No deformity  SKIN: Warm and dry NEUROLOGIC:  Alert and oriented x 3 PSYCHIATRIC:  Normal affect   ASSESSMENT:    1. 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HCC)   2. Paroxysmal atrial fibrillation with rapid ventricular response (HCC)    PLAN:    In order of problems listed  above:  Hypertrophic obstructive cardiomyopathy -Avoid dehydration.  Family history, father had hypertrophic heart on autopsy -Try to avoid elective surgeries given the wild swings in blood pressure that occurred previously putting herself in danger. -She has been evaluated by Dr. Graciela Li who has been treating her atrial fibrillation with dofetilide.  She does not have an ICD. -No ventricular tachycardia on prior monitoring.  No unexplained syncope.  Septum less than 3 mm.  Cardiac MRI did not show any evidence of systolic anterior motion of mitral valve back in 2018 however most recent echocardiogram did.  Stress echocardiogram also performed that did not show any evidence of increased gradient during exercise.   Hyper vagal response -Occasional experiences of nausea vomiting pallor diaphoresis diarrhea feeling wiped out for hours which she has been battling for years.  Avoiding dehydration. -These can accompany hypotension as well.  Has midodrine to take as needed.  COPD -Underlying dyspnea as well severe obstruction, Dr. Delton Coombes has been following.  Medication management unchanged.  Paroxysmal atrial fibrillation -Cardioversion June 2019-felt sluggish weakness shortness of breath.  She did have a successful cardioversion on propofol without any hemodynamic instability thankfully.  She was on amiodarone previously but now on dofetilide.  Appreciate Dr. Graciela Li assistance.  Peripheral vascular disease -Femoropopliteal performed by Dr. Edilia Bo.  Trying to avoid future procedures.  No significant claudication.  Statin intolerance -Seems to tolerate Zetia.  Continue.  Labile hypertension -Wild swings in blood pressure in  part secondary to outflow tract obstruction.  Trying to utilize low-dose beta-blocker.  Has Cardizem as well for significant hypertension.  Very challenging situation.  We will go ahead and recheck a CBC and a basic metabolic profile.  Dr. Graciela Li checked 1 last month and  hemoglobin and white blood cell count was slightly increased.  Hemoglobin could have been increased because of her underlying COPD.  We will repeat both.  Make sure that her electrolytes were normal in the setting of her recent A. fib.  She also utilizes her apple watch for assistance as well.  6 mth f/u  Medication Adjustments/Labs and Tests Ordered: Current medicines are reviewed at length with the patient today.  Concerns regarding medicines are outlined above.  Orders Placed This Encounter  Procedures   Basic metabolic panel   CBC   EKG 12-Lead   No orders of the defined types were placed in this encounter.   Patient Instructions  Medication Instructions:  No changes *If you need a refill on your cardiac medications before your next appointment, please call your pharmacy*   Lab Work: Today: cbc, bmet  If you have labs (blood work) drawn today and your tests are completely normal, you will receive your results only by:  MyChart Message (if you have MyChart) OR  A paper copy in the mail If you have any lab test that is abnormal or we need to change your treatment, we will call you to review the results.   Testing/Procedures: none   Follow-Up: At [facility], you and your health needs are our priority.  As part of our continuing mission to provide you with exceptional heart care, we have created designated Provider Care Teams.  These Care Teams include your primary Cardiologist (physician) and Advanced Practice Providers (APPs -  Physician Assistants and Nurse Practitioners) who all work together to provide you with the care you need, when you need it.   Your next appointment:   6 month(s)  The format for your next appointment:   In Person  Provider:   You may see Beverly Schultz, MD or one of the following Advanced Practice Providers on your designated Care Team:    Norma Fredrickson, NP  Nada Boozer, NP  Georgie Chard, NP    Other Instructions       Signed, Beverly Schultz, MD  01/25/2020 9:50 AM    [facility]

## 2020-01-25 NOTE — Patient Instructions (Addendum)
Medication Instructions:  No changes *If you need a refill on your cardiac medications before your next appointment, please call your pharmacy*   Lab Work: Today: cbc, bmet  If you have labs (blood work) drawn today and your tests are completely normal, you will receive your results only by: [name] MyChart Message (if you have MyChart) OR . A paper copy in the mail If you have any lab test that is abnormal or we need to change your treatment, we will call you to review the results.   Testing/Procedures: none   Follow-Up: At [facility], you and your health needs are our priority.  As part of our continuing mission to provide you with exceptional heart care, we have created designated Provider Care Teams.  These Care Teams include your primary Cardiologist (physician) and Advanced Practice Providers (APPs -  Physician Assistants and Nurse Practitioners) who all work together to provide you with the care you need, when you need it.   Your next appointment:   6 month(s)  The format for your next appointment:   In Person  Provider:   You may see Donato Schultz, MD or one of the following Advanced Practice Providers on your designated Care Team:    Norma Fredrickson, NP  Nada Boozer, NP  Georgie Chard, NP    Other Instructions

## 2020-02-11 ENCOUNTER — Other Ambulatory Visit: Payer: Self-pay | Admitting: Emergency Medicine

## 2020-02-17 ENCOUNTER — Other Ambulatory Visit: Payer: Self-pay | Admitting: Emergency Medicine

## 2020-02-17 ENCOUNTER — Telehealth: Payer: Self-pay | Admitting: Emergency Medicine

## 2020-02-17 NOTE — Telephone Encounter (Signed)
Lm for patient.  

## 2020-02-18 MED ORDER — STIOLTO RESPIMAT 2.5-2.5 MCG/ACT IN AERS: 2.0000 | INHALATION_SPRAY | Freq: Every day | RESPIRATORY_TRACT | 1 refills | Status: DC

## 2020-02-18 NOTE — Telephone Encounter (Signed)
Rx for stiolto has been sent to preferred pharmacy.  Patient is aware and voiced her understanding.  Nothing further needed.   

## 2020-03-02 ENCOUNTER — Other Ambulatory Visit: Payer: Self-pay | Admitting: Cardiology

## 2020-03-27 ENCOUNTER — Telehealth: Payer: Self-pay | Admitting: Cardiology

## 2020-03-27 ENCOUNTER — Other Ambulatory Visit: Payer: Self-pay | Admitting: Emergency Medicine

## 2020-03-27 NOTE — Telephone Encounter (Signed)
    Pt c/o medication issue:  1. Name of Medication:   diltiazem (CARDIZEM) 30 MG tablet    hydrALAZINE (APRESOLINE) 25 MG tablet    2. How are you currently taking this medication (dosage and times per day)?   3. Are you having a reaction (difficulty breathing--STAT)?   4. What is your medication issue? [name] from [facility] needs information about these medications. She said pt is confused how to take it

## 2020-03-27 NOTE — Telephone Encounter (Signed)
Left message for [name] with the Chronic Care Management Team at [facility].

## 2020-03-29 NOTE — Telephone Encounter (Signed)
Returned call to [facility] with [facility] physician.  Gave medication information.  No further action needed.

## 2020-04-05 ENCOUNTER — Ambulatory Visit: Payer: Medicare Other | Admitting: Emergency Medicine

## 2020-04-05 ENCOUNTER — Ambulatory Visit: Payer: Medicare Other | Admitting: Pulmonary Disease

## 2020-04-25 ENCOUNTER — Other Ambulatory Visit: Payer: Self-pay | Admitting: Internal Medicine

## 2020-04-25 NOTE — Telephone Encounter (Signed)
Prescription refill request for Eliquis received.  Indication: afib Last office visit: 01/25/2020, [place] Scr: 0.99, 01/25/2020 Age: 75 yo Weight: 52.2 kg   Prescription refill sent.

## 2020-04-27 ENCOUNTER — Ambulatory Visit (INDEPENDENT_AMBULATORY_CARE_PROVIDER_SITE_OTHER): Payer: Medicare Other

## 2020-04-27 ENCOUNTER — Encounter: Payer: Self-pay | Admitting: Pulmonary Disease

## 2020-04-27 ENCOUNTER — Ambulatory Visit (INDEPENDENT_AMBULATORY_CARE_PROVIDER_SITE_OTHER): Payer: Medicare Other | Admitting: Pulmonary Disease

## 2020-04-27 ENCOUNTER — Other Ambulatory Visit: Payer: Self-pay

## 2020-04-27 VITALS — BP 118/70 | HR 93 | Temp 97.0°F | Ht 63.0 in | Wt 114.2 lb

## 2020-04-27 DIAGNOSIS — Z4659 Encounter for fitting and adjustment of other gastrointestinal appliance and device: Secondary | ICD-10-CM | POA: Insufficient documentation

## 2020-04-27 DIAGNOSIS — Z87891 Personal history of nicotine dependence: Secondary | ICD-10-CM

## 2020-04-27 DIAGNOSIS — J449 Chronic obstructive pulmonary disease, unspecified: Secondary | ICD-10-CM

## 2020-04-27 DIAGNOSIS — Z0000 Encounter for general adult medical examination without abnormal findings: Secondary | ICD-10-CM

## 2020-04-27 DIAGNOSIS — I517 Cardiomegaly: Secondary | ICD-10-CM | POA: Diagnosis not present

## 2020-04-27 NOTE — Patient Instructions (Addendum)
You were seen today by Coral Ceo, NP  for:  1. Chronic obstructive pulmonary disease, unspecified COPD type (HCC)  - DG Chest 2 View; Future  Stiolto Respimat inhaler >>>2 puffs daily >>>Take this no matter what >>>This is not a rescue inhaler  Continue Flovent  Only use your albuterol as a rescue medication to be used if you can't catch your breath by resting or doing a relaxed purse lip breathing pattern.  - The less you use it, the better it will work when you need it. - Ok to use up to 2 puffs  every 4 hours if you must but call for immediate appointment if use goes up over your usual need - Don't leave home without it !!  (think of it like the spare tire for your car)   Note your daily symptoms > remember "red flags" for COPD:   >>>Increase in cough >>>increase in sputum production >>>increase in shortness of breath or activity  intolerance.   If you notice these symptoms, please call the office to be seen.   As discussed today would work on continuing to increase your overall physical activity goal should be 30 minutes of aerobic activity a day  If you become interested in a pulmonary rehab referral please contact our office and we can coordinate this  2. Former smoker  Chest x-ray today  3. Healthcare maintenance  Great job being up-to-date with your vaccinations   We recommend today:  Orders Placed This Encounter  Procedures  . DG Chest 2 View    Standing Status:   Future    Standing Expiration Date:   08/25/2020    Order Specific Question:   Reason for Exam (SYMPTOM  OR DIAGNOSIS REQUIRED)    Answer:   copd, former smoker    Order Specific Question:   Preferred imaging location?    Answer:   Internal    Order Specific Question:   Radiology Contrast Protocol - do NOT remove file path    Answer:   [email or web address]   Orders Placed This Encounter  Procedures  . DG Chest 2 View   No orders of the  defined types were placed in this encounter.   Follow Up:    Return in about 6 months (around 10/25/2020), or if symptoms worsen or fail to improve, for Follow up with Dr. Delton Coombes.   Notification of test results are managed in the following manner: If there are  any recommendations or changes to the  plan of care discussed in office today,  we will contact you and let you know what they are. If you do not hear from [place], then your results are normal and you can view them through your  MyChart account , or a letter will be sent to you. Thank you again for trusting [place] with your care  - Thank you, [facility]    It is flu season:   >>> Best ways to protect herself from the flu: Receive the yearly flu vaccine, practice good hand hygiene washing with soap and also using hand sanitizer when available, eat a nutritious meals, get adequate rest, hydrate appropriately       Please contact the office if your symptoms worsen or you have concerns that you are not improving.   Thank you for choosing [facility] for your healthcare, and for allowing [place] to partner with you on your healthcare journey. I am thankful to be able to provide care to you today.  Elisha Headland FNP-C    COPD and Physical Activity Chronic obstructive pulmonary disease (COPD) is a long-term (chronic) condition that affects the lungs. COPD is a general term that can be used to describe many different lung problems that cause lung swelling (inflammation) and limit airflow, including chronic bronchitis and emphysema. The main symptom of COPD is shortness of breath, which makes it harder to do even simple tasks. This can also make it harder to exercise and be active. Talk with your health care provider about treatments to help you breathe better and actions you can take to prevent breathing problems during physical activity. What are the benefits of exercising with COPD? Exercising regularly is an important part of a  healthy lifestyle. You can still exercise and do physical activities even though you have COPD. Exercise and physical activity improve your shortness of breath by increasing blood flow (circulation). This causes your heart to pump more oxygen through your body. Moderate exercise can improve your:  Oxygen use.  Energy level.  Shortness of breath.  Strength in your breathing muscles.  Heart health.  Sleep.  Self-esteem and feelings of self-worth.  Depression, stress, and anxiety levels. Exercise can benefit everyone with COPD. The severity of your disease may affect how hard you can exercise, especially at first, but everyone can benefit. Talk with your health care provider about how much exercise is safe for you, and which activities and exercises are safe for you.   What actions can I take to prevent breathing problems during physical activity?  Sign up for a pulmonary rehabilitation program. This type of program may include: ? Education about lung diseases. ? Exercise classes that teach you how to exercise and be more active while improving your breathing. This usually involves:  Exercise using your lower extremities, such as a stationary bicycle.  About 30 minutes of exercise, 2 to 5 times per week, for 6 to 12 weeks  Strength training, such as push ups or leg lifts. ? Nutrition education. ? Group classes in which you can talk with others who also have COPD and learn ways to manage stress.  If you use an oxygen tank, you should use it while you exercise. Work with your health care provider to adjust your oxygen for your physical activity. Your resting flow rate is different from your flow rate during physical activity.  While you are exercising: ? Take slow breaths. ? Pace yourself and do not try to go too fast. ? Purse your lips while breathing out. Pursing your lips is similar to a kissing or whistling position. ? If doing exercise that uses a quick burst of effort, such as  weight lifting:  Breathe in before starting the exercise.  Breathe out during the hardest part of the exercise (such as raising the weights). Where to find support You can find support for exercising with COPD from:  Your health care provider.  A pulmonary rehabilitation program.  Your local health department or community health programs.  Support groups, online or in-person. Your health care provider may be able to recommend support groups. Where to find more information You can find more information about exercising with COPD from:  American Lung Association: [place].[place].  COPD Foundation: Almost[employer].gl. Contact a health care provider if:  Your symptoms get worse.  You have chest pain.  You have nausea.  You have a fever.  You have trouble talking or catching your breath.  You want to start a new exercise program or a new activity.  Summary  COPD is a general term that can be used to describe many different lung problems that cause lung swelling (inflammation) and limit airflow. This includes chronic bronchitis and emphysema.  Exercise and physical activity improve your shortness of breath by increasing blood flow (circulation). This causes your heart to provide more oxygen to your body.  Contact your health care provider before starting any exercise program or new activity. Ask your health care provider what exercises and activities are safe for you. This information is not intended to replace advice given to you by your health care provider. Make sure you discuss any questions you have with your health care provider. Document Revised: 07/22/2018 Document Reviewed: 04/24/2017 Elsevier Patient Education  2021 [employer].

## 2020-04-27 NOTE — Assessment & Plan Note (Addendum)
Reviewed pulmonary function testing with patient Reviewed pathophysiology of COPD Reviewed staging of COPD Reviewed lab work for eosinophils Discussed other treatment options such as triple therapy inhalers  Plan: Continue Stiolto Continue Flovent Continue rescue inhaler Chest x-ray today Work on increasing overall physical activity, goal of 30 minutes of aerobic physical activity day Could consider pulmonary rehab referral in the future once COVID-19 community numbers have decreased

## 2020-04-27 NOTE — Assessment & Plan Note (Signed)
Up-to-date with vaccinations °

## 2020-04-27 NOTE — Progress Notes (Signed)
@Patient  ID: , female    DOB: 1946/03/09, 75 y.o.   MRN: [id number]  Chief Complaint  Patient presents with  . Follow-up    Doing well, no issues    Referring provider: Rankins, [id number], MD  HPI:  75 year old former smoker followed in our office for COPD   PMH: Hyperlipidemia, PAD, hypertension, chronic diastolic heart failure Smoker/ Smoking History: Former smoker.  Quit 2016.  50-pack-year smoking history Maintenance: Stiolto Respimat, Flovent HFA  Pt of: Dr. 2017  04/27/2020  - Visit   75 year old female former smoker followed in our office for COPD.  Patient established with Dr. 61.  Completing 15-month follow-up with our office today.  Patient reports she is doing well from a respiratory standpoint.  She reports adherence to 8-month.   Patient reports that she remains fairly immobile during the day.  She does not carveout specific physical exercise with exception of walking the dog.  She walks the dog about 5 times a day walking about 3 blocks each time.  She does not feel that she is sedentary.  She does have to use her rescue Hailer 1-2 times a day.  Specifically when climbing stairs or if she starts to become more anxious or rushed.  She has some baseline congestion which is clear to light yellow.  This is been managed well by resuming Claritin.  Questionaires / Pulmonary Flowsheets:   ACT:  No flowsheet data found.  MMRC: mMRC Dyspnea Scale mMRC Score  04/27/2020 2    Epworth:  No flowsheet data found.  Tests:   03/15/2019-pulmonary function test-FVC 2.77 (66% predicted)  03/15/2019-FVC- 1.85 (66% addicted), postbronchodilator ratio 48, FEV1 one 0.91 (43% predicted), no bronchodilator response, DLCO 7.74 (41% retention)  10/26/2018-CBC with differential-eosinophils 0.1  FENO:  No results found for: NITRICOXIDE  PFT: PFT Results Latest Ref Rng & Units 03/15/2019  FVC-Pre L 1.85  FVC-Predicted Pre % 66  FVC-Post L 1.91   FVC-Predicted Post % 69  Pre FEV1/FVC % % 45  Post FEV1/FCV % % 48  FEV1-Pre L 0.82  FEV1-Predicted Pre % 39  FEV1-Post L 0.91  DLCO uncorrected ml/min/mmHg 7.74  DLCO UNC% % 41  DLVA Predicted % 53  TLC L 5.13  TLC % Predicted % 104  RV % Predicted % 143    WALK:  SIX MIN WALK 01/06/2019 01/26/2016  Supplimental Oxygen during Test? (L/min) No No    Imaging: No results found.  Lab Results:  CBC    Component Value Date/Time   WBC 9.6 01/25/2020 0923   WBC 7.6 10/26/2018 1530   RBC 5.56 (H) 01/25/2020 0923   RBC 4.69 10/26/2018 1530   HGB 17.2 (H) 01/25/2020 0923   HCT 50.0 (H) 01/25/2020 0923   PLT 270 01/25/2020 0923   MCV 90 01/25/2020 0923   MCH 30.9 01/25/2020 0923   MCH 29.9 10/26/2018 1530   MCHC 34.4 01/25/2020 0923   MCHC 29.8 (L) 10/26/2018 1530   RDW 13.2 01/25/2020 0923   LYMPHSABS 1.9 10/26/2018 1530   LYMPHSABS 3.3 (H) 09/23/2017 1629   MONOABS 0.5 10/26/2018 1530   EOSABS 0.1 10/26/2018 1530   EOSABS 0.0 09/23/2017 1629   BASOSABS 0.0 10/26/2018 1530   BASOSABS 0.0 09/23/2017 1629    BMET    Component Value Date/Time   NA 140 01/25/2020 0923   K 4.8 01/25/2020 0923   CL 101 01/25/2020 0923   CO2 28 01/25/2020 0923   GLUCOSE 91 01/25/2020  4818   GLUCOSE 116 (H) 10/26/2018 1530   BUN 29 (H) 01/25/2020 0923   CREATININE 0.99 01/25/2020 0923   CALCIUM 9.5 01/25/2020 0923   GFRNONAA 56 (L) 01/25/2020 0923   GFRAA 65 01/25/2020 0923    BNP No results found for: BNP  ProBNP No results found for: PROBNP  Specialty Problems      Pulmonary Problems   COPD (chronic obstructive pulmonary disease) (HCC)   Cough      Allergies  Allergen Reactions  . Amoxicillin Other (See Comments)    UTI Has patient had a PCN reaction causing immediate rash, facial/tongue/throat swelling, SOB or lightheadedness with hypotension: No Has patient had a PCN reaction causing severe rash involving mucus membranes or skin necrosis: No Has patient had a  PCN reaction that required hospitalization: No Has patient had a PCN reaction occurring within the last 10 years: Yes--UTI ONLY If all of the above answers are "NO", then may proceed with Cephalosporin use.   . Atenolol Cough  . Crestor [Rosuvastatin Calcium] Other (See Comments)    Muscle aches  . Other Other (See Comments)  . Pravastatin Other (See Comments)    Muscle aches  . Sulfa Antibiotics Nausea Only  . Codeine     hallucinations    Immunization History  Administered Date(s) Administered  . Influenza Split 01/16/2009, 04/30/2010, 01/26/2016, 02/07/2018, 02/01/2020  . Influenza, High Dose Seasonal PF 02/07/2018, 01/25/2019  . Influenza,inj,Quad PF,6+ Mos 03/06/2012, 01/26/2016, 01/10/2017  . Influenza,inj,quad, With Preservative 02/14/2015  . PFIZER SARS-COV-2 Vaccination 05/06/2019, 05/27/2019, 01/27/2020  . Pneumococcal Conjugate-13 03/19/2017  . Pneumococcal Polysaccharide-23 02/14/2015  . Td 02/25/2005  . Tdap 05/19/2013  . Zoster 07/28/2006    Past Medical History:  Diagnosis Date  . Anxiety   . Arthritis    "some in my lower back; probably elbows, knees" (11/18/2017)  . Atrial fibrillation (HCC)   . Bell's palsy    when pt. was 75 yrs old, when under stress the left side of face will droop.  . Complication of anesthesia    "vascular OR 2016; BP bottomed out; couldn't get it regulated; ended up in ICU for DAYS" (11/18/2017)  . GERD (gastroesophageal reflux disease)   . Heart murmur   . History of kidney stones   . Hypertension   . Hypertrophic cardiomyopathy (HCC)    severe LV basilar hypertrophy witn no evidence of significant outflow tract obstruction, EF 65-70%, mild LAE, mild TR, grade 1a diastolic dysfunction 05/15/10 (Dr. Donato Schultz) (Atrial Septal Hypertrophy pattern)-- Intra-op TEE with dsignificant outflow tract obstruction - AI, MR & TR  . Insomnia   . Mild aortic sclerosis   . Osteopenia   . Peripheral vascular disease (HCC)   . Syncope    , Vagal     Tobacco History: Social History   Tobacco Use  Smoking Status Former Smoker  . Packs/day: 1.00  . Years: 50.00  . Pack years: 50.00  . Types: Cigarettes  . Quit date: 12/15/2014  . Years since quitting: 5.3  Smokeless Tobacco Never Used   Counseling given: Not Answered   Continue to not smoke  Outpatient Encounter Medications as of 04/27/2020  Medication Sig  . acetaminophen (TYLENOL) 500 MG tablet Take 1,500 mg by mouth every 6 (six) hours as needed (for pain.).  [name] alendronate (FOSAMAX) 70 MG tablet Take 70 mg by mouth every Monday. Take with a full glass of water on an empty stomach.  [name] buPROPion (WELLBUTRIN XL) 150 MG 24 hr tablet Take  150 mg by mouth every morning.   . Calcium-Phosphorus-Vitamin D (CALCIUM/D3 ADULT GUMMIES PO) Take 2 tablets by mouth daily at 2 PM.  . DEXILANT 30 MG capsule Take 1 capsule by mouth daily.  [name]. diltiazem (CARDIZEM) 30 MG tablet Take 1 tablet (30 mg total) by mouth daily as needed (blood pressure higher than 160).  . dofetilide (TIKOSYN) 250 MCG capsule TAKE 1 CAPSULE(250 MCG) BY MOUTH TWICE DAILY  . ELIQUIS 5 MG TABS tablet TAKE 1 TABLET(5 MG) BY MOUTH TWICE DAILY  . ezetimibe (ZETIA) 10 MG tablet TAKE 1 TABLET(10 MG) BY MOUTH DAILY  . FLOVENT HFA 44 MCG/ACT inhaler INHALE 2 PUFFS INTO THE LUNGS IN THE MORNING AND AT BEDTIME  . hydrALAZINE (APRESOLINE) 25 MG tablet TAKE 1 TABLET BY MOUTH EVERY 8 HOURS AS NEEDED FOR SYSTOLIC BLOOD PRESSURE GREATER THAN 160  . midodrine (PROAMATINE) 10 MG tablet TAKE 1 TABLET BY MOUTH THREE TIMES DAILY WITH MEALS AS NEEDED FOR BLOD PRESSURE LOWER THAN 100(SYSTOLIC)  . Multiple Vitamins-Iron (STRESS B COMPLEX/IRON) TABS Take 1 tablet by mouth at bedtime.   . nebivolol (BYSTOLIC) 2.5 MG tablet Take 1 tablet (2.5 mg total) by mouth daily.  . potassium chloride (KLOR-CON) 10 MEQ tablet Take 1 tablet (10 mEq total) by mouth daily.  [name]. PROAIR HFA 108 (90 Base) MCG/ACT inhaler Inhale 2 puffs into the lungs 4 (four) times  daily as needed (for wheezing/shortness of breath.).   . Tiotropium Bromide-Olodaterol (STIOLTO RESPIMAT) 2.5-2.5 MCG/ACT AERS Inhale 2 puffs into the lungs daily.  [name]. zolpidem (AMBIEN) 10 MG tablet Take 10 mg by mouth at bedtime as needed.   No facility-administered encounter medications on file as of 04/27/2020.     Review of Systems  Review of Systems  Constitutional: Negative for activity change, fatigue and fever.  HENT: Positive for congestion (Baseline, clear-light yellow). Negative for sinus pressure, sinus pain and sore throat.   Respiratory: Positive for shortness of breath. Negative for cough and wheezing.   Cardiovascular: Negative for chest pain and palpitations.  Gastrointestinal: Negative for diarrhea, nausea and vomiting.  Musculoskeletal: Negative for arthralgias.  Neurological: Negative for dizziness.  Psychiatric/Behavioral: Negative for sleep disturbance. The patient is not nervous/anxious.      Physical Exam  BP 118/70 (BP Location: Left Arm, Cuff Size: Normal)   Pulse 93   Temp (!) 97 F (36.1 C) (Oral)   Ht 5\' 3"  (1.6 m)   Wt 114 lb 3.2 oz (51.8 kg)   LMP  (LMP Unknown)   SpO2 93%   BMI 20.23 kg/m   Wt Readings from Last 5 Encounters:  04/27/20 114 lb 3.2 oz (51.8 kg)  01/25/20 115 lb (52.2 kg)  12/27/19 112 lb 6.4 oz (51 kg)  06/30/19 111 lb (50.3 kg)  04/26/19 110 lb (49.9 kg)    BMI Readings from Last 5 Encounters:  04/27/20 20.23 kg/m  01/25/20 19.74 kg/m  12/27/19 19.29 kg/m  06/30/19 19.66 kg/m  04/26/19 19.49 kg/m     Physical Exam Vitals and nursing note reviewed.  Constitutional:      General: She is not in acute distress.    Appearance: Normal appearance. She is normal weight.  HENT:     Head: Normocephalic and atraumatic.     Right Ear: Tympanic membrane, ear canal and external ear normal. There is no impacted cerumen.     Left Ear: Tympanic membrane, ear canal and external ear normal. There is no impacted cerumen.      Nose: Nose  normal. No congestion.     Mouth/Throat:     Mouth: Mucous membranes are moist.     Pharynx: Oropharynx is clear.  Eyes:     Pupils: Pupils are equal, round, and reactive to light.  Cardiovascular:     Rate and Rhythm: Normal rate and regular rhythm.     Pulses: Normal pulses.     Heart sounds: Normal heart sounds. No murmur heard.   Pulmonary:     Effort: Pulmonary effort is normal. No respiratory distress.     Breath sounds: No decreased air movement. No decreased breath sounds, wheezing or rales.     Comments: Diminished breath sounds throughout exam Musculoskeletal:     Cervical back: Normal range of motion.  Skin:    General: Skin is warm and dry.     Capillary Refill: Capillary refill takes less than 2 seconds.  Neurological:     General: No focal deficit present.     Mental Status: She is alert and oriented to person, place, and time. Mental status is at baseline.     Gait: Gait normal.  Psychiatric:        Mood and Affect: Mood normal.        Behavior: Behavior normal.        Thought Content: Thought content normal.        Judgment: Judgment normal.       Assessment & Plan:   COPD (chronic obstructive pulmonary disease) (HCC) Reviewed pulmonary function testing with patient Reviewed pathophysiology of COPD Reviewed staging of COPD Reviewed lab work for eosinophils Discussed other treatment options such as triple therapy inhalers  Plan: Continue Stiolto Continue Flovent Continue rescue inhaler Chest x-ray today Work on increasing overall physical activity, goal of 30 minutes of aerobic physical activity day Could consider pulmonary rehab referral in the future once COVID-19 community numbers have decreased  Former smoker Plan: Chest x-ray today  Healthcare maintenance Up-to-date with vaccinations    Return in about 6 months (around 10/25/2020), or if symptoms worsen or fail to improve, for Follow up with Dr. Delton Coombes.   Coral Ceo,  NP 04/27/2020   This appointment required 32 minutes of patient care (this includes precharting, chart review, review of results, face-to-face care, etc.).

## 2020-04-27 NOTE — Assessment & Plan Note (Signed)
Plan: Chest x-ray today 

## 2020-05-12 ENCOUNTER — Other Ambulatory Visit: Payer: Self-pay | Admitting: Internal Medicine

## 2020-05-18 DIAGNOSIS — K219 Gastro-esophageal reflux disease without esophagitis: Secondary | ICD-10-CM | POA: Diagnosis not present

## 2020-05-18 DIAGNOSIS — I480 Paroxysmal atrial fibrillation: Secondary | ICD-10-CM | POA: Diagnosis not present

## 2020-05-18 DIAGNOSIS — E7800 Pure hypercholesterolemia, unspecified: Secondary | ICD-10-CM | POA: Diagnosis not present

## 2020-05-18 DIAGNOSIS — E785 Hyperlipidemia, unspecified: Secondary | ICD-10-CM | POA: Diagnosis not present

## 2020-05-18 DIAGNOSIS — J449 Chronic obstructive pulmonary disease, unspecified: Secondary | ICD-10-CM | POA: Diagnosis not present

## 2020-05-18 DIAGNOSIS — I10 Essential (primary) hypertension: Secondary | ICD-10-CM | POA: Diagnosis not present

## 2020-05-18 DIAGNOSIS — G4700 Insomnia, unspecified: Secondary | ICD-10-CM | POA: Diagnosis not present

## 2020-05-18 DIAGNOSIS — M810 Age-related osteoporosis without current pathological fracture: Secondary | ICD-10-CM | POA: Diagnosis not present

## 2020-05-30 ENCOUNTER — Other Ambulatory Visit: Payer: Self-pay | Admitting: Internal Medicine

## 2020-06-11 ENCOUNTER — Other Ambulatory Visit: Payer: Self-pay | Admitting: Internal Medicine

## 2020-06-13 ENCOUNTER — Telehealth: Payer: Self-pay | Admitting: Internal Medicine

## 2020-06-13 MED ORDER — NEBIVOLOL HCL 2.5 MG PO TABS: ORAL_TABLET | ORAL | 2 refills | Status: DC

## 2020-06-13 NOTE — Addendum Note (Signed)
Addended by: Margaret Pyle D on: 06/13/2020 01:33 PM   Modules accepted: Orders

## 2020-06-13 NOTE — Telephone Encounter (Signed)
Pt's medication was resent to pt's pharmacy as requested. Confirmation received.  °

## 2020-06-13 NOTE — Telephone Encounter (Signed)
°*  STAT* If patient is at the pharmacy, call can be transferred to refill team.   1. Which medications need to be refilled? (please list name of each medication and dose if known)  nebivolol (BYSTOLIC) 2.5 MG tablet  2. Which pharmacy/location (including street and city if local pharmacy) is medication to be sent to?  [id number] - [place], [place] - [address]  3. Do they need a 30 day or 90 day supply? 30

## 2020-06-13 NOTE — Telephone Encounter (Signed)
Pt's medication was sent to pt's pharmacy as requested. Confirmation received.  °

## 2020-07-13 DIAGNOSIS — G4700 Insomnia, unspecified: Secondary | ICD-10-CM | POA: Diagnosis not present

## 2020-07-13 DIAGNOSIS — J449 Chronic obstructive pulmonary disease, unspecified: Secondary | ICD-10-CM | POA: Diagnosis not present

## 2020-07-13 DIAGNOSIS — E785 Hyperlipidemia, unspecified: Secondary | ICD-10-CM | POA: Diagnosis not present

## 2020-07-13 DIAGNOSIS — I480 Paroxysmal atrial fibrillation: Secondary | ICD-10-CM | POA: Diagnosis not present

## 2020-07-13 DIAGNOSIS — I10 Essential (primary) hypertension: Secondary | ICD-10-CM | POA: Diagnosis not present

## 2020-07-13 DIAGNOSIS — K219 Gastro-esophageal reflux disease without esophagitis: Secondary | ICD-10-CM | POA: Diagnosis not present

## 2020-07-13 DIAGNOSIS — E7800 Pure hypercholesterolemia, unspecified: Secondary | ICD-10-CM | POA: Diagnosis not present

## 2020-07-13 DIAGNOSIS — M810 Age-related osteoporosis without current pathological fracture: Secondary | ICD-10-CM | POA: Diagnosis not present

## 2020-07-24 ENCOUNTER — Telehealth: Payer: Self-pay | Admitting: Emergency Medicine

## 2020-07-24 NOTE — Telephone Encounter (Signed)
ATC x1, left message with receptionist Beverly Li) for Beverly Li to return my call to my extension:  [phone].  Received a call from Beverly Li with Beverly Li, she received a secure chat from [facility] regarding this patient.  Advised that Dr. Delton Coombes is ok with a trial change to Trelegy and if she benefits then we can continue it.  She verbalized understanding.  Nothing further needed.

## 2020-07-24 NOTE — Telephone Encounter (Signed)
I would be ok with a trial change to trelegy. If she benefits then we can continue it

## 2020-07-24 NOTE — Telephone Encounter (Signed)
Kristen from [facility] is calling to see if RB/BPM would be ok with the pt going off of her Stiolto/FLovent and starting on Trelegy 100.  Pt has been having to use her albuterol rescue inhaler 2 x daily.  Pt was last seen by BPM on 04/2020 and was told to follow up around 07/22.  RB please advise. Thanks  Allergies  Allergen Reactions  . Amoxicillin Other (See Comments)    UTI Has patient had a PCN reaction causing immediate rash, facial/tongue/throat swelling, SOB or lightheadedness with hypotension: No Has patient had a PCN reaction causing severe rash involving mucus membranes or skin necrosis: No Has patient had a PCN reaction that required hospitalization: No Has patient had a PCN reaction occurring within the last 10 years: Yes--UTI ONLY If all of the above answers are "NO", then may proceed with Cephalosporin use.   . Atenolol Cough  . Crestor [Rosuvastatin Calcium] Other (See Comments)    Muscle aches  . Other Other (See Comments)  . Pravastatin Other (See Comments)    Muscle aches  . Sulfa Antibiotics Nausea Only  . Codeine     hallucinations

## 2020-07-26 DIAGNOSIS — E785 Hyperlipidemia, unspecified: Secondary | ICD-10-CM | POA: Diagnosis not present

## 2020-07-26 DIAGNOSIS — I10 Essential (primary) hypertension: Secondary | ICD-10-CM | POA: Diagnosis not present

## 2020-07-26 DIAGNOSIS — M810 Age-related osteoporosis without current pathological fracture: Secondary | ICD-10-CM | POA: Diagnosis not present

## 2020-07-26 DIAGNOSIS — E7800 Pure hypercholesterolemia, unspecified: Secondary | ICD-10-CM | POA: Diagnosis not present

## 2020-07-26 DIAGNOSIS — J449 Chronic obstructive pulmonary disease, unspecified: Secondary | ICD-10-CM | POA: Diagnosis not present

## 2020-07-26 DIAGNOSIS — G4700 Insomnia, unspecified: Secondary | ICD-10-CM | POA: Diagnosis not present

## 2020-07-26 DIAGNOSIS — K219 Gastro-esophageal reflux disease without esophagitis: Secondary | ICD-10-CM | POA: Diagnosis not present

## 2020-07-26 DIAGNOSIS — I480 Paroxysmal atrial fibrillation: Secondary | ICD-10-CM | POA: Diagnosis not present

## 2020-08-07 ENCOUNTER — Telehealth: Payer: Self-pay | Admitting: Emergency Medicine

## 2020-08-07 NOTE — Telephone Encounter (Signed)
Primary Pulmonologist: RB Last office visit and with whom: 04/27/2020 with Arlys John What do we see them for (pulmonary problems): COPD Last OV assessment/plan: Reviewed pulmonary function testing with patient Reviewed pathophysiology of COPD Reviewed staging of COPD Reviewed lab work for eosinophils Discussed other treatment options such as triple therapy inhalers  Plan: Continue Stiolto Continue Flovent Continue rescue inhaler Chest x-ray today Work on increasing overall physical activity, goal of 30 minutes of aerobic physical activity day Could consider pulmonary rehab referral in the future once COVID-19 community numbers have decreased  Was appointment offered to patient (explain)?  Patient is currently out of town.    Reason for call: Called and spoke with patient. She stated that she has had a productive cough for the past few weeks. The phlegm has been cloudy and thick. Increased SOB. She denied any body aches or fevers. She has been taking Mucinex to see if this would help. So far it has not. She is still using her Flovent twice daily.   (examples of things to ask: : When did symptoms start? Fever? Cough? Productive? Color to sputum? More sputum than usual? Wheezing? Have you needed increased oxygen? Are you taking your respiratory medications? What over the counter measures have you tried?)  Allergies  Allergen Reactions  . Amoxicillin Other (See Comments)    UTI Has patient had a PCN reaction causing immediate rash, facial/tongue/throat swelling, SOB or lightheadedness with hypotension: No Has patient had a PCN reaction causing severe rash involving mucus membranes or skin necrosis: No Has patient had a PCN reaction that required hospitalization: No Has patient had a PCN reaction occurring within the last 10 years: Yes--UTI ONLY If all of the above answers are "NO", then may proceed with Cephalosporin use.   . Atenolol Cough  . Crestor [Rosuvastatin Calcium] Other (See  Comments)    Muscle aches  . Other Other (See Comments)  . Pravastatin Other (See Comments)    Muscle aches  . Sulfa Antibiotics Nausea Only  . Codeine     hallucinations    Immunization History  Administered Date(s) Administered  . Influenza Split 01/16/2009, 04/30/2010, 01/26/2016, 02/07/2018, 02/01/2020  . Influenza, High Dose Seasonal PF 02/07/2018, 01/25/2019  . Influenza,inj,Quad PF,6+ Mos 03/06/2012, 01/26/2016, 01/10/2017  . Influenza,inj,quad, With Preservative 02/14/2015  . PFIZER(Purple Top)SARS-COV-2 Vaccination 05/06/2019, 05/27/2019, 01/27/2020  . Pneumococcal Conjugate-13 03/19/2017  . Pneumococcal Polysaccharide-23 02/14/2015  . Td 02/25/2005  . Tdap 05/19/2013  . Zoster 07/28/2006   She wants to know if RB would be willing to send in something for the cough.   Pharmacy is [facility] in [facility].   [place], can you please advise? Thanks.

## 2020-08-07 NOTE — Telephone Encounter (Signed)
Please let her know that I would like for her to take doxycycline 100mg  bid x 7 days.  She needs to be seen if she doesn't improve with this.

## 2020-08-07 NOTE — Telephone Encounter (Signed)
Attempted to call pt but unable to reach. Left message for him to return call. °

## 2020-08-08 MED ORDER — DOXYCYCLINE HYCLATE 100 MG PO TABS: 100.0000 mg | ORAL_TABLET | Freq: Two times a day (BID) | ORAL | 0 refills | Status: DC

## 2020-08-08 NOTE — Telephone Encounter (Signed)
Pt notified of recommendations from RB. Rx sent to pharm. Closing encounter.

## 2020-08-08 NOTE — Telephone Encounter (Signed)
ATC, left VM for callback. 

## 2020-08-10 ENCOUNTER — Telehealth: Payer: Self-pay | Admitting: Cardiology

## 2020-08-10 NOTE — Telephone Encounter (Signed)
Spoke with pt who reports increasing episodes of tachycardia with rates above 120 according to her watch.  Pt states she does not believe she is having Afib during episodes although when she has these events it leaves her very fatigued.  Episodes can last for several hours and are becoming more frequent over the last few weeks and generally happen at night.  She denies that she is currently experiencing any symptoms of tachycardia, CP, SOB, dizziness/fainting.  She states her last episode was 4 days ago. She is complaint with her Tikosyn and Eliquis.  Pt is currently at her vacation home in [facility].   Appointment scheduled with Francis Dowse, PA-C for 08/16/2020.  Reviewed ED precautions.  Pt verbalizes understanding and agrees with current plan.

## 2020-08-10 NOTE — Telephone Encounter (Signed)
Patient called to say her heart rate has double and its last for 12-14 hrs and its been happening 2/3 weeks. Please advise

## 2020-08-15 NOTE — Progress Notes (Signed)
Cardiology Office Note Date:  08/16/2020  Patient ID:  Beverly, Li [birth year], MRN [id number] PCP:  Clayborn Heron, MD  Cardiologist:  Dr. Anne Fu Electrophysiologist: Dr. Graciela Husbands    Chief Complaint: palpitations  History of Present Illness: Beverly Li is a 75 y.o. female with history of  HOCM (father also had) AFib on Tikosyn abnormal PFTs with severe obstruction (COPD), follows with Dr. Delton Coombes, anxiety,  HTN with wild swings in BP and profound hypotension as well that has required intervention with midodrine and phenylepherine and once intubation, post procedures Hyper vagal response PVD with prior fem-pop bypass (R)  She comes in today to be seen for Dr. Graciela Husbands last seen by him Sept 2021 Using midodrine and hydralazine PRN, planned for surveillance labs  She saw Dr. Anne Fu most recently Oct 2021, all in all doing fairly well, planned for labs, discussed avoidance of procedures given her BP behavior, discussed by various modalities some showing LVOT obstructions, others not, and importance of avoiding dehydration   TODAY She has had an increase in burden of palpitations in the last 2-3 weeks, no clear trigger, othing unusual of late They are like her Afib symptoms, some can last a few hours, with a sense that her HR is fast some 120's-130's They make her feel fatigued No CP, no unusual SOB No near syncope or syncope.  Her BP swings are by far much improved in the last couple years She still gets high readings and uses the hydralazine, but none that have been <100 systolic She will get drops occasionally but not like before.  No bleeding or signs of bleeding   AFib hx AFlutter 2019  AAD Hx Initially amiodarone Tikosyn is current   Past Medical History:  Diagnosis Date  . Anxiety   . Arthritis    "some in my lower back; probably elbows, knees" (11/18/2017)  . Atrial fibrillation (HCC)   . Bell's palsy    when pt. was 76 yrs old, when under stress the  left side of face will droop.  . Complication of anesthesia    "vascular OR 2016; BP bottomed out; couldn't get it regulated; ended up in ICU for DAYS" (11/18/2017)  . GERD (gastroesophageal reflux disease)   . Heart murmur   . History of kidney stones   . Hypertension   . Hypertrophic cardiomyopathy (HCC)    severe LV basilar hypertrophy witn no evidence of significant outflow tract obstruction, EF 65-70%, mild LAE, mild TR, grade 1a diastolic dysfunction 05/15/10 (Dr. Donato Schultz) (Atrial Septal Hypertrophy pattern)-- Intra-op TEE with dsignificant outflow tract obstruction - AI, MR & TR  . Insomnia   . Mild aortic sclerosis   . Osteopenia   . Peripheral vascular disease (HCC)   . Syncope    , Vagal    Past Surgical History:  Procedure Laterality Date  . AUGMENTATION MAMMAPLASTY Bilateral   . BACK SURGERY    . CARDIAC CATHETERIZATION N/A 05/07/2016   Procedure: Left Heart Cath and Coronary Angiography;  Surgeon: Kathleene Hazel, MD;  Location: [facility] INVASIVE CV LAB;  Service: Cardiovascular;  Laterality: N/A;  . CARDIOVERSION N/A 09/24/2017   Procedure: CARDIOVERSION;  Surgeon: Laurey Morale, MD;  Location: [facility] ENDOSCOPY;  Service: Cardiovascular;  Laterality: N/A;  . DILATION AND CURETTAGE OF UTERUS    . ENDARTERECTOMY FEMORAL Right 03/02/2015   Procedure: ENDARTERECTOMY RIGHT FEMORAL;  Surgeon: Chuck Hint, MD;  Location: [facility] OR;  Service: Vascular;  Laterality: Right;  . FACIAL COSMETIC  SURGERY Left 2002   "related to Bell's Palsy @ age 75; left eye/side of face droopy; tried to make area symmetrical"  . FEMORAL-POPLITEAL BYPASS GRAFT Right 03/02/2015   Procedure: BYPASS GRAFT FEMORAL-BELOW KNEE POPLITEAL ARTERY;  Surgeon: Chuck Hinthristopher S Dickson, MD;  [facility];  Service: Vascular;  Laterality: Right;  . INGUINAL HERNIA REPAIR Bilateral 2002  . OVARIAN CYST REMOVAL Left   . PERIPHERAL VASCULAR CATHETERIZATION N/A 01/16/2015   Procedure: Abdominal Aortogram;   Surgeon: Chuck Hinthristopher S Dickson, MD;  [facility] CV LAB;  Service: Cardiovascular;  Laterality: N/A;  . POSTERIOR LUMBAR FUSION  2015   "have plates and screws in there"  . TONSILLECTOMY      Current Outpatient Medications  Medication Sig Dispense Refill  . acetaminophen (TYLENOL) 500 MG tablet Take 1,500 mg by mouth every 6 (six) hours as needed (for pain.).    [name]. alendronate (FOSAMAX) 70 MG tablet Take 70 mg by mouth every Monday. Take with a full glass of water on an empty stomach.    [name]. buPROPion (WELLBUTRIN XL) 150 MG 24 hr tablet Take 150 mg by mouth every morning.     . Calcium-Phosphorus-Vitamin D (CALCIUM/D3 ADULT GUMMIES PO) Take 2 tablets by mouth daily at 2 PM.    . DEXILANT 30 MG capsule Take 1 capsule by mouth daily.    [name]. diltiazem (CARDIZEM) 30 MG tablet Take 1 tablet (30 mg total) by mouth daily as needed (blood pressure higher than 160). 90 tablet 3  . dofetilide (TIKOSYN) 250 MCG capsule TAKE 1 CAPSULE(250 MCG) BY MOUTH TWICE DAILY 180 capsule 1  . doxycycline (VIBRA-TABS) 100 MG tablet Take 1 tablet (100 mg total) by mouth 2 (two) times daily. 14 tablet 0  . ELIQUIS 5 MG TABS tablet TAKE 1 TABLET(5 MG) BY MOUTH TWICE DAILY 180 tablet 1  . ezetimibe (ZETIA) 10 MG tablet TAKE 1 TABLET(10 MG) BY MOUTH DAILY 90 tablet 3  . hydrALAZINE (APRESOLINE) 25 MG tablet TAKE 1 TABLET BY MOUTH EVERY 8 HOURS AS NEEDED FOR SYSTOLIC BLOOD PRESSURE GREATER THAN 160 90 tablet 8  . midodrine (PROAMATINE) 10 MG tablet TAKE 1 TABLET BY MOUTH THREE TIMES DAILY WITH MEALS AS NEEDED FOR BLOD PRESSURE LOWER THAN 100(SYSTOLIC) 90 tablet 7  . Multiple Vitamins-Iron (STRESS B COMPLEX/IRON) TABS Take 1 tablet by mouth at bedtime.     . nebivolol (BYSTOLIC) 2.5 MG tablet TAKE 1 TABLET(2.5 MG) BY MOUTH DAILY 90 tablet 2  . potassium chloride (KLOR-CON) 10 MEQ tablet TAKE 1 TABLET(10 MEQ) BY MOUTH DAILY 30 tablet 6  . PROAIR HFA 108 (90 Base) MCG/ACT inhaler Inhale 2 puffs into the lungs 4 (four)  times daily as needed (for wheezing/shortness of breath.).   1  . Tiotropium Bromide-Olodaterol (STIOLTO RESPIMAT) 2.5-2.5 MCG/ACT AERS Inhale 2 puffs into the lungs daily. 8 g 1  . TRELEGY ELLIPTA 100-62.5-25 MCG/INH AEPB Take 1 puff by mouth daily.    [name]. zolpidem (AMBIEN) 10 MG tablet Take 10 mg by mouth at bedtime as needed.     No current facility-administered medications for this visit.    Allergies:   Amoxicillin, Atenolol, Crestor [rosuvastatin calcium], Other, Pravastatin, Sulfa antibiotics, and Codeine   Social History:  The patient  reports that she quit smoking about 5 years ago. Her smoking use included cigarettes. She has a 50.00 pack-year smoking history. She has never used smokeless tobacco. She reports current alcohol use. She reports that she does not use drugs.   Family History:  The patient's family history includes Breast cancer in her sister; Cancer in her father, mother, and sister; Hypertension in her mother; Liver cancer in her mother; Lung cancer in her father.  ROS:  Please see the history of present illness.    All other systems are reviewed and otherwise negative.   PHYSICAL EXAM:  VS:  BP (!) 150/82   Pulse 66   Ht 5\' 4"  (1.626 m)   Wt 106 lb 9.6 oz (48.4 kg)   LMP  (LMP Unknown)   SpO2 90%   BMI 18.30 kg/m  BMI: Body mass index is 18.3 kg/m. Well nourished, well developed, in no acute distress HEENT: normocephalic, atraumatic Neck: no JVD, carotid bruits or masses Cardiac:  RRR; no significant murmurs, no rubs, or gallops Lungs:  CTA b/l, no wheezing, rhonchi or rales Abd: soft, nontender MS: no deformity, age appropriate atrophy Ext: no edema Skin: warm and dry, no rash Neuro:  No gross deficits appreciated Psych: euthymic mood, full affect   EKG:  Done today and reviewed by myself shows  SR 66bpm, baseline wandering, 1st degree AVblock, PR , manually measured QT , Qtc  12/29/2018: TTE IMPRESSIONS  1. The left ventricle has  normal systolic function with an ejection  fraction of 60-65%. The cavity size was normal. Left ventricular diastolic  Doppler parameters are consistent with impaired relaxation. Elevated mean  left atrial pressure.  2. The right ventricle has normal systolic function. The cavity was  normal.  3. Left atrial size was severely dilated.  4. The mitral valve is abnormal. Mild thickening of the mitral valve  leaflet.  5. The tricuspid valve is grossly normal.  6. The aortic valve is tricuspid. Mild thickening of the aortic valve.  Aortic valve regurgitation is mild by color flow Doppler. No stenosis of  the aortic valve.  7. The aorta is normal unless otherwise noted.  8. Normal LV systolic function; grade 1 diastolic dysfunction; severe  asymmetric LVH most prominent in the proximal septum; no significant LVOT  gradient at rest or with valsalva; mild AI; mild MR; severe LAE. Findings  suggestive of hypertrophic  cardiomyopathy.    07/18/2016: c.MRI IMPRESSION: 1) Hypertrophic cardiomyopathy with basal septum measuring 21 mm and spade-like ventrical  2) Hyperenhancement in the basal septum and apex  3) No frank SAM but small LVOT with peak recorded gradient 11 mmHg suggest echo correlation  4) Tri-leaflet aortic valve area by planimetry 2.0 cm2  5) Normal aortic root  6) Moderate LAE  7) Mild MR  8) Normal RV    05/07/2016: LHC  Mid RCA lesion, 20 %stenosed.  2nd Mrg lesion, 20 %stenosed.  Prox Cx to Mid Cx lesion, 30 %stenosed.  Mid LAD lesion, 60 %stenosed.   1. Mild to moderate non-obstructive CAD 2. The LAD is a large caliber vessel that reaches the apex. The mid LAD has a moderate eccentric stenosis just after the moderate caliber diagonal branch. Angiographically this appears to be moderate.  FFR of this lesion suggests the stenosis is not flow limiting. (FFR 0.82-0.85).  3. The RCA and Circumflex have minor disease.  4. Abnormal LV pressures c/w  HOCM.   Recommendations: Medical management of moderate non-obstructive CAD.     Recent Labs: 12/27/2019: Magnesium 2.0 01/25/2020: BUN 29; Creatinine, Ser 0.99; Hemoglobin 17.2; Platelets 270; Potassium 4.8; Sodium 140  No results found for requested labs within last 8760 hours.   CrCl cannot be calculated (Patient's most recent lab result is older than  the maximum 21 days allowed.).   Wt Readings from Last 3 Encounters:  08/16/20 106 lb 9.6 oz (48.4 kg)  04/27/20 114 lb 3.2 oz (51.8 kg)  01/25/20 115 lb (52.2 kg)     Other studies reviewed: Additional studies/records reviewed today include: summarized above  ASSESSMENT AND PLAN:  1. HCM (?HOCM)     No syncope     No symptoms or exam findings of volume OL  2. Paroxysmal Afib, flutter     CHA2DS2Vasc is 5, on eliquis, appropriately dosed for age,Creat     Tikosyn      QTc is stable      Increased palpitations of late  He has been taking her hydralazine with the palpitations, not her prn dilt. Given her complicated history, I reached out to Dr. Anne Fu, would have her try a single dose of the 30mg  diltiazem  I have advised her not to take BOTH hydralazine and diiltiazem.  Only to use the prn diltiazem once for palpitations/fast rates that last more then an hour or so  She is given the phone number for the AFib clinic as well if she has ongoing increased burden of palpitations. Will get BMET and a mag today Last Creat with today's weight is a clearance of 37, thuogh her creat usually better  She sees Dr. already scheduled for the end of the months as well.  3. HTN     With hyper vagal response and marked hypotension as well.  Disposition: F/u as scheduled with Dr. Anne Fu, Dr. Anne Fu in 21months, sooner if needed.    Current medicines are reviewed at length with the patient today.  The patient did not have any concerns regarding medicines.  11month, PA-C 08/16/2020 4:44 PM     [facility] [address] [place] [phone] (office)  [phone] (fax)

## 2020-08-16 ENCOUNTER — Encounter: Payer: Self-pay | Admitting: Physician Assistant

## 2020-08-16 ENCOUNTER — Ambulatory Visit: Payer: Medicare Other | Admitting: Physician Assistant

## 2020-08-16 ENCOUNTER — Other Ambulatory Visit: Payer: Self-pay

## 2020-08-16 VITALS — BP 150/82 | HR 66 | Ht 64.0 in | Wt 106.6 lb

## 2020-08-16 DIAGNOSIS — I480 Paroxysmal atrial fibrillation: Secondary | ICD-10-CM

## 2020-08-16 DIAGNOSIS — Z79899 Other long term (current) drug therapy: Secondary | ICD-10-CM | POA: Diagnosis not present

## 2020-08-16 DIAGNOSIS — Z5181 Encounter for therapeutic drug level monitoring: Secondary | ICD-10-CM

## 2020-08-16 DIAGNOSIS — I421 Obstructive hypertrophic cardiomyopathy: Secondary | ICD-10-CM

## 2020-08-16 DIAGNOSIS — I10 Essential (primary) hypertension: Secondary | ICD-10-CM

## 2020-08-16 NOTE — Patient Instructions (Addendum)
Medication Instructions:   Your physician recommends that you continue on your current medications as directed. Please refer to the Current Medication list given to you today.   *If you need a refill on your cardiac medications before your next appointment, please call your pharmacy*   Lab Work:  BMET AND MAG TODAY   If you have labs (blood work) drawn today and your tests are completely normal, you will receive your results only by: [name] MyChart Message (if you have MyChart) OR . A paper copy in the mail If you have any lab test that is abnormal or we need to change your treatment, we will call you to review the results.   Testing/Procedures: NONE ORDERED  TODAY    Follow-Up: At [facility], you and your health needs are our priority.  As part of our continuing mission to provide you with exceptional heart care, we have created designated Provider Care Teams.  These Care Teams include your primary Cardiologist (physician) and Advanced Practice Providers (APPs -  Physician Assistants and Nurse Practitioners) who all work together to provide you with the care you need, when you need it.  We recommend signing up for the patient portal called "MyChart".  Sign up information is provided on this After Visit Summary.  MyChart is used to connect with patients for Virtual Visits (Telemedicine).  Patients are able to view lab/test results, encounter notes, upcoming appointments, etc.  Non-urgent messages can be sent to your provider as well.   To learn more about what you can do with MyChart, go to [email or web address].    Your next appointment:  AS SCHEDULED   Other Instructions   AFIB CLINIC INFORMATION: The AFib Clinic is located in the Heart and Vascular Specialty Clinics at [facility]. Parking instructions/directions: [address] social [address] ([address]). When you pull in to Entrance C, there is an underground parking garage to your right..Take the elevators to the first floor.  Follow the signs to the Heart and Vascular Specialty Clinics. You will see registration at the end of the hallway.  Phone number: [phone]

## 2020-08-17 LAB — BASIC METABOLIC PANEL
BUN/Creatinine Ratio: 27 (ref 12–28)
BUN: 26 mg/dL (ref 8–27)
CO2: 27 mmol/L (ref 20–29)
Calcium: 9.9 mg/dL (ref 8.7–10.3)
Chloride: 101 mmol/L (ref 96–106)
Creatinine, Ser: 0.96 mg/dL (ref 0.57–1.00)
Glucose: 112 mg/dL — ABNORMAL HIGH (ref 65–99)
Potassium: 4.9 mmol/L (ref 3.5–5.2)
Sodium: 144 mmol/L (ref 134–144)
eGFR: 62 mL/min/{1.73_m2} (ref >59)

## 2020-08-17 LAB — MAGNESIUM: Magnesium: 1.9 mg/dL (ref 1.6–2.3)

## 2020-08-25 ENCOUNTER — Ambulatory Visit (HOSPITAL_COMMUNITY)
Admission: RE | Admit: 2020-08-25 | Discharge: 2020-08-25 | Disposition: A | Discharge status: Home / Self Care | Payer: Medicare Other | Source: Ambulatory Visit | Attending: Physician Assistant | Admitting: Physician Assistant

## 2020-08-25 ENCOUNTER — Other Ambulatory Visit: Payer: Self-pay

## 2020-08-25 ENCOUNTER — Telehealth (HOSPITAL_COMMUNITY): Payer: Self-pay | Admitting: *Deleted

## 2020-08-25 VITALS — BP 160/88 | HR 112

## 2020-08-25 DIAGNOSIS — I480 Paroxysmal atrial fibrillation: Secondary | ICD-10-CM | POA: Insufficient documentation

## 2020-08-25 MED ORDER — NEBIVOLOL HCL 2.5 MG PO TABS: ORAL_TABLET | ORAL | 2 refills | Status: DC

## 2020-08-25 NOTE — Telephone Encounter (Signed)
Pt called in stating her HR according to her apple watch has been elevated for the last 2 hours 111-120. She states she was instructed by Dr Odessa Fleming PA to come in for EKG if she can when in this rhythm. Pt down for ekg this afternoon.

## 2020-08-25 NOTE — Patient Instructions (Signed)
Increase bystolic 5mg  once a day

## 2020-08-28 ENCOUNTER — Encounter (HOSPITAL_COMMUNITY): Payer: Medicare Other | Admitting: Physician Assistant

## 2020-08-28 NOTE — Progress Notes (Signed)
Patient presents for ECG. ECG confirms afib HR 112, QRS 94, QTc 442. Increase bystolic to 5 mg daily. F/u with Otilio Saber 5/27.

## 2020-09-06 ENCOUNTER — Ambulatory Visit: Payer: Medicare Other | Admitting: Cardiology

## 2020-09-07 DIAGNOSIS — I10 Essential (primary) hypertension: Secondary | ICD-10-CM | POA: Diagnosis not present

## 2020-09-07 DIAGNOSIS — I480 Paroxysmal atrial fibrillation: Secondary | ICD-10-CM | POA: Diagnosis not present

## 2020-09-07 DIAGNOSIS — G4700 Insomnia, unspecified: Secondary | ICD-10-CM | POA: Diagnosis not present

## 2020-09-07 DIAGNOSIS — M810 Age-related osteoporosis without current pathological fracture: Secondary | ICD-10-CM | POA: Diagnosis not present

## 2020-09-07 DIAGNOSIS — E785 Hyperlipidemia, unspecified: Secondary | ICD-10-CM | POA: Diagnosis not present

## 2020-09-07 DIAGNOSIS — K219 Gastro-esophageal reflux disease without esophagitis: Secondary | ICD-10-CM | POA: Diagnosis not present

## 2020-09-07 DIAGNOSIS — E7800 Pure hypercholesterolemia, unspecified: Secondary | ICD-10-CM | POA: Diagnosis not present

## 2020-09-07 DIAGNOSIS — J449 Chronic obstructive pulmonary disease, unspecified: Secondary | ICD-10-CM | POA: Diagnosis not present

## 2020-09-08 ENCOUNTER — Ambulatory Visit: Payer: Medicare Other | Admitting: Student

## 2020-09-12 ENCOUNTER — Ambulatory Visit: Payer: Medicare Other | Admitting: Cardiology

## 2020-09-12 ENCOUNTER — Other Ambulatory Visit: Payer: Self-pay

## 2020-09-12 ENCOUNTER — Encounter: Payer: Self-pay | Admitting: Cardiology

## 2020-09-12 VITALS — BP 160/70 | Ht 64.0 in | Wt 109.0 lb

## 2020-09-12 DIAGNOSIS — I421 Obstructive hypertrophic cardiomyopathy: Secondary | ICD-10-CM | POA: Diagnosis not present

## 2020-09-12 DIAGNOSIS — I480 Paroxysmal atrial fibrillation: Secondary | ICD-10-CM

## 2020-09-12 MED ORDER — NEBIVOLOL HCL 10 MG PO TABS: 10.0000 mg | ORAL_TABLET | Freq: Every day | ORAL | 3 refills | Status: DC

## 2020-09-12 NOTE — Progress Notes (Signed)
Cardiology Office Note:    Date:  09/12/2020   ID:  Beverly [name], DOB [birth year], MRN [id number]  PCP:  Beverly [name], Beverly R, MD   [facility] Providers Cardiologist:  Beverly SchultzMark Mehtaab Mayeda, MD     Referring MD: Beverly [name], Beverly R, MD     History of Present Illness:    Beverly [name] is a 75 y.o. female here for follow up of hypertension, atrial fibrillation, and HOCM. Beverly [name], Beverly [name], prior cardioversion with underlying hypertrophic obstructive cardiomyopathy with prior severe swings in blood pressure, in part vagally mediated.  She called over to the office after feeling her heart rate in the 110 range.  She was feeling a bit more shortness of breath, more fatigue.  Her [name]  Via telephone message she was instructed to take an additional short acting diltiazem, evening Beverly [name] and to come in the office this morning for further evaluation.  She recently saw Beverly [name] with electrophysiology on 12/27/2019.  Office note reviewed.  At that time she noted a couple episodes of low blood pressure 100 or so and fast heart rate in the 120s rarely over the last year.  He stated that she continues to use midodrine and hydralazine as needed.  Probably had an episode of paroxysmal atrial fibrillation yesterday with the rapid heartbeat. Took diltiazem 30 PRN and felt better.   Feels rapid HR every 2 months. April, July and yesterday. Wakes her up. Uncomfortable, little nausea, racing.   Prior office visit review pulled forward given the complexity of her history:  hypertrophic obstructive cardiomyopathy, recent severe exacerbation again post cardiac catheterization and prior during femoropopliteal bypass resulting in phenylephrine , fluids and prolonged intubation here for follow-up.  She underwent diagnostic heart catheterization on 05/07/16 and post procedure developed severe hypotension, hyper vagal response. Requiring once again phenylephrine, facemask  oxygen. She did not require intubation. 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 Hydralazine was given post catheterization wound systolic pressure pressure was 190. Profound hypotension and bradycardia noted. This was treated rapidly, fluids, phenylephrine, heart rates as low as the 40s.  Midodrine started. Phenylephrine eventually weaned off. Diuresis noted. Klonopin was started for anxiety, to hopefully help blunt the hyper adrenergic response. She denies being atypically anxious person however.  Echocardiogram 04/2016 showed EF 65-70% with hypertrophic obstructive cardiomyopathy with LVOT gradient of 23 mm at rest.  She is here for follow-up today and should have appointment set up with Beverly [name] to discuss her profound autonomic dysfunction.  In 2014 started having "heart burn" episodes. Debilitating. Out walking dog. By time she would get back, sweat, diahpesis, up neck, nausea then diarrhea. Wiped out for a few hours. Does not feel it now with exercise. Felt 3 hours irreg hb afib kicked in. Afaid to go anywhere. The sweat comes over her hot. Pain. After she described this once again, this is why I proceeded with cardiac catheterization to make sure that with her peripheral vascular disease that she did not have severe coronary artery disease that was now becoming progressive. Thankfully, she did not have flow limiting disease. Moderate LAD, FFR.  Shortly after cardiac catheterization when her blood pressure increased to the 190 systolic range, she was given hydralazine to reduce her blood pressure and she ended up having a hyper vagal response where her heart rate decreases the 40s, blood pressure plummeted into the 50 systolic, she developed flash pulmonary edema. She was placed in the CCU,  phenylephrine. Slowly, this was able to be weaned. Klonopin and Midodrin was started.  This episode post catheterization was very similar to her episode intraoperatively  during her femoropopliteal bypass. She was intubated for several days after that procedure and with her arterial line demonstrated classic spike and dome changes intermittently with marked variations in her blood pressure at various points.  09/16/16-since her last visit she has had cardiac MRI and stress echocardiogram. Both were reassuring. She did not drop her blood pressures during exercise. She did not increase her gradient significantly during exercise. We were able to invoke an increase left ventricular output tract gradient with Valsalva maneuver. Please see report. No ventricular arrhythmias. She states that over the last 3 months she's felt the best that she's felt in quite some time. She is drinking sports drinks quite frequently, propel. When she does have low blood pressure usually has a prodrome, very vagal-like response as noted previously.  03/31/17-she had a fall in [place] when walking on cobblestones in a garden with flip-flops during a house store with her girlfriends. She states that she broke her fall with her lips. She also broke her arm. Dr. Cleophas Dunker. She has not had any severe cardiac symptoms thankfully. At one point during the last several months she did have low blood pressure and she took the Midrin which took effect after approximately 3 hours. She has been drinking propel. Doing very well.  06/26/17 - 2 episodes with crashed BP, out for the day. Midodrine then. 185/110 at home. She took a hydralazine at home and now her blood pressure is 158/90. We once again went over how challenging it is for her given her hypertrophic cardiomyopathy and previous severe hypotension. Currently she is not have any chest pain, no strokelike symptoms. No fevers chills nausea vomiting.  10/28/2017-cardioversion took place in June 2019 setting of 2-1 atrial flutter with rapid ventricular response for approximately 4 days causing symptoms. Dr. Shirlee Latch performed cardioversion with Dr.  Jean Rosenthal as anesthesiologist. Dr. Graciela [name] saw her in clinic prior to conversion. Overall she has been doing quite well. She did have one other episode at the beach lasting approximately 2 or 3 hours at a heart rate that she says was 125 to 130 bpm. No syncope, no bleeding, no orthopnea, no PND.  12/28/18 -chief complaint: She has been experiencing more shortness of breath. She dealt with discitis and prolonged IV antibiotic course. Fatigue. After long discussion with infectious disease, we did not pursue invasive biopsy because of problems with anesthesia in the past. COVID neg. Rely on albuterol. Helps. No syncope in 6 months. Beverly [name] has worked well. [place]. Has lost quite a bit of weight during this infection. Was depressed for a little while. Starting to come back she states. Likely, over the last 6 months she is not had any drops in her blood pressure.  06/30/2019 -Here for follow-up of hypertrophic cardiomyopathy. Overall doing quite well. Received both Covid vaccines. Impressed with the vaccination process at the [facility]. No syncope no chest pain. Dr. Odessa Fleming note reviewed see below. Pulmonary, Dr. Kavin Leech note reviewed. Severe obstruction noted on PFT.She is now taking the inhaler Stiolto.Interestingly, she was going through paperwork found her father's autopsy printed on onion paper. She stated that hypertrophic heart was listed on his report.   09/12/20  Has been HTN, 200 at home. No CP. Feels AFIB as well.  Asked about potential breast implant removal.  Stated that she would be high risk for general anesthesia for elective surgeries.  Try  to avoid any elective procedures. She denies any exertional chest pain, tightness, or pressure. She has no orthopnea, PND, LE edema, or lightheadedness    Past Medical History:  Diagnosis Date  . Anxiety   . Arthritis    "some in my lower back; probably elbows, knees" (11/18/2017)  . Atrial fibrillation (HCC)   . Bell's palsy     when pt. was 75 yrs old, when under stress the left side of face will droop.  . Complication of anesthesia    "vascular OR 2016; BP bottomed out; couldn't get it regulated; ended up in ICU for DAYS" (11/18/2017)  . GERD (gastroesophageal reflux disease)   . Heart murmur   . History of kidney stones   . Hypertension   . Hypertrophic cardiomyopathy (HCC)    severe LV basilar hypertrophy witn no evidence of significant outflow tract obstruction, EF 65-70%, mild LAE, mild TR, grade 1a diastolic dysfunction 05/15/10 (Dr. Donato [name]) (Atrial Septal Hypertrophy pattern)-- Intra-op TEE with dsignificant outflow tract obstruction - AI, MR & TR  . Insomnia   . Mild aortic sclerosis   . Osteopenia   . Peripheral vascular disease (HCC)   . Syncope    , Vagal    Past Surgical History:  Procedure Laterality Date  . AUGMENTATION MAMMAPLASTY Bilateral   . BACK SURGERY    . CARDIAC CATHETERIZATION N/A 05/07/2016   Procedure: Left Heart Cath and Coronary Angiography;  Surgeon: Kathleene Hazel, MD;  Location: [facility] INVASIVE CV LAB;  Service: Cardiovascular;  Laterality: N/A;  . CARDIOVERSION N/A 09/24/2017   Procedure: CARDIOVERSION;  Surgeon: Laurey Morale, MD;  Location: [facility] ENDOSCOPY;  Service: Cardiovascular;  Laterality: N/A;  . DILATION AND CURETTAGE OF UTERUS    . ENDARTERECTOMY FEMORAL Right 03/02/2015   Procedure: ENDARTERECTOMY RIGHT FEMORAL;  Surgeon: Chuck Hint, MD;  Location[facility] OR;  Service: Vascular;  Laterality: Right;  . FACIAL COSMETIC SURGERY Left 2002   "related to Bell's Palsy @ age 12; left eye/side of face droopy; tried to make area symmetrical"  . FEMORAL-POPLITEAL BYPASS GRAFT Right 03/02/2015   Procedure: BYPASS GRAFT FEMORAL-BELOW KNEE POPLITEAL ARTERY;  Surgeon: Chuck Hint, MD;  Location: [facility] OR;  Service: Vascular;  Laterality: Right;  . INGUINAL HERNIA REPAIR Bilateral 2002  . OVARIAN CYST REMOVAL Left   . PERIPHERAL VASCULAR CATHETERIZATION N/A  01/16/2015   Procedure: Abdominal Aortogram;  Surgeon: Chuck Hint, MD;  Location: [facility] INVASIVE CV LAB;  Service: Cardiovascular;  Laterality: N/A;  . POSTERIOR LUMBAR FUSION  2015   "have plates and screws in there"  . TONSILLECTOMY      Current Medications: Current Meds  Medication Sig  . acetaminophen (TYLENOL) 500 MG tablet Take 1,500 mg by mouth every 6 (six) hours as needed (for pain.).  [name] alendronate (FOSAMAX) 70 MG tablet Take 70 mg by mouth every Monday. Take with a full glass of water on an empty stomach.  [name] buPROPion (WELLBUTRIN XL) 150 MG 24 hr tablet Take 150 mg by mouth every morning.   . Calcium-Phosphorus-Vitamin D (CALCIUM/D3 ADULT GUMMIES PO) Take 2 tablets by mouth daily at 2 PM.  . DEXILANT 30 MG capsule Take 1 capsule by mouth daily.  [name] diltiazem (CARDIZEM) 30 MG tablet Take 1 tablet (30 mg total) by mouth daily as needed (blood pressure higher than 160).  . dofetilide (Beverly [name]) 250 MCG capsule TAKE 1 CAPSULE(250 MCG) BY MOUTH TWICE DAILY  . ELIQUIS 5 MG TABS tablet TAKE 1 TABLET(5 MG) BY  MOUTH TWICE DAILY  . ezetimibe (ZETIA) 10 MG tablet TAKE 1 TABLET(10 MG) BY MOUTH DAILY  . hydrALAZINE (APRESOLINE) 25 MG tablet TAKE 1 TABLET BY MOUTH EVERY 8 HOURS AS NEEDED FOR SYSTOLIC BLOOD PRESSURE GREATER THAN 160  . midodrine (PROAMATINE) 10 MG tablet TAKE 1 TABLET BY MOUTH THREE TIMES DAILY WITH MEALS AS NEEDED FOR BLOD PRESSURE LOWER THAN 100(SYSTOLIC)  . Multiple Vitamins-Iron (STRESS B COMPLEX/IRON) TABS Take 1 tablet by mouth at bedtime.   . nebivolol (BYSTOLIC) 10 MG tablet Take 1 tablet (10 mg total) by mouth daily.  . potassium chloride (KLOR-CON) 10 MEQ tablet TAKE 1 TABLET(10 MEQ) BY MOUTH DAILY  . PROAIR HFA 108 (90 Base) MCG/ACT inhaler Inhale 2 puffs into the lungs 4 (four) times daily as needed (for wheezing/shortness of breath.).   [name] TRELEGY ELLIPTA 100-62.5-25 MCG/INH AEPB Take 1 puff by mouth daily.  [name] zolpidem (AMBIEN) 10 MG tablet Take 10 mg by mouth  at bedtime as needed.  . [DISCONTINUED] nebivolol (BYSTOLIC) 2.5 MG tablet TAKE 5 mg BY MOUTH DAILY     Allergies:   Amoxicillin, Atenolol, Crestor [rosuvastatin calcium], Other, Pravastatin, Sulfa antibiotics, and Codeine   Social History   Socioeconomic History  . Marital status: Married    Spouse name: Not on file  . Number of children: Not on file  . Years of education: Not on file  . Highest education level: Not on file  Occupational History  . Not on file  Tobacco Use  . Smoking status: Former Smoker    Packs/day: 1.00    Years: 50.00    Pack years: 50.00    Types: Cigarettes    Quit date: 12/15/2014    Years since quitting: 5.7  . Smokeless tobacco: Never Used  Vaping Use  . Vaping Use: Never used  Substance and Sexual Activity  . Alcohol use: Yes    Comment: 11/18/2017 "might have a couple glasses of wine/month; if that"  . Drug use: Never  . Sexual activity: Not Currently  Other Topics Concern  . Not on file  Social History Narrative  . Not on file   Social Determinants of Health   Financial Resource Strain: Not on file  Food Insecurity: Not on file  Transportation Needs: Not on file  Physical Activity: Not on file  Stress: Not on file  Social Connections: Not on file     Family History: The patient's family history includes Breast cancer in her sister; Cancer in her father, mother, and sister; Hypertension in her mother; Liver cancer in her mother; Lung cancer in her father.  ROS:   Please see the history of present illness. All other systems reviewed and are negative.  EKGs/Labs/Other Studies Reviewed:    The following studies were reviewed today:   07/18/2016: c.MRI IMPRESSION: 1) Hypertrophic cardiomyopathy with basal septum measuring 21 mm and spade-like ventrical  2) Hyperenhancement in the basal septum and apex  3) No frank SAM but small LVOT with peak recorded gradient 11 mmHg suggest echo correlation  4) Tri-leaflet aortic valve area  by planimetry 2.0 cm2  5) Normal aortic root  6) Moderate LAE  7) Mild MR  8) Normal RV    05/07/2016: LHC  Mid RCA lesion, 20 %stenosed.  2nd Mrg lesion, 20 %stenosed.  Prox Cx to Mid Cx lesion, 30 %stenosed.  Mid LAD lesion, 60 %stenosed.  1. Mild to moderate non-obstructive CAD 2. The LAD is a large caliber vessel that reaches the apex. The mid  LAD has a moderate eccentric stenosis just after the moderate caliber diagonal branch. Angiographically this appears to be moderate. FFR of this lesion suggests the stenosis is not flow limiting. (FFR 0.82-0.85).  3. The RCA and Circumflex have minor disease.  4. Abnormal LV pressures c/w HOCM.   Recommendations: Medical management of moderate non-obstructive CAD.     Recent Labs: 01/25/2020: Hemoglobin 17.2; Platelets 270 08/16/2020: BUN 26; Creatinine, Ser 0.96; Magnesium 1.9; Potassium 4.9; Sodium 144  Recent Lipid Panel    Component Value Date/Time   CHOL 211 (H) 09/16/2016 1200   TRIG 78 09/16/2016 1200   HDL 97 09/16/2016 1200   CHOLHDL 2.2 09/16/2016 1200   LDLCALC 98 09/16/2016 1200     Risk Assessment/Calculations:      Physical Exam:    VS:  BP (!) 160/70 (BP Location: Left Arm, Patient Position: Sitting, Cuff Size: Normal)   Ht 5\' 4"  (1.626 m)   Wt 109 lb (49.4 kg)   LMP  (LMP Unknown)   BMI 18.71 kg/m     Wt Readings from Last 3 Encounters:  09/12/20 109 lb (49.4 kg)  08/16/20 106 lb 9.6 oz (48.4 kg)  04/27/20 114 lb 3.2 oz (51.8 kg)     GEN:  Well nourished, well developed in no acute distress HEENT: Normal NECK: No JVD; No carotid bruits LYMPHATICS: No lymphadenopathy CARDIAC: RRR, no murmurs, rubs, gallops RESPIRATORY:  Clear to auscultation without rales, wheezing or rhonchi  ABDOMEN: Soft, non-tender, non-distended MUSCULOSKELETAL:  No edema; No deformity  SKIN: Warm and dry NEUROLOGIC:  Alert and oriented x 3 PSYCHIATRIC:  Normal affect   ASSESSMENT:    1. HOCM  (hypertrophic obstructive cardiomyopathy) (HCC)   2. Paroxysmal atrial fibrillation (HCC)    PLAN:    In order of problems listed above:  Hypertrophic cardiomyopathy, difficult to control hypertension - Cardiac MRI confirming - Recently increased diastolic to 5 mg daily.  Since she is showing continuous hypertension, we will increase this to 10 mg daily.  We have to be careful with outflow tract obstruction.  We will watch the bradycardia as well.  Obviously if her heart rate gets too slow or she becomes symptomatic we may need to rethink the increase in Bystolic.  CAD - Mild to moderate nonobstructive CAD.  Paroxysmal atrial fibrillation - EKG on 08/25/2020 personally reviewed shows heart rate 112 bpm.  Atrial fibrillation.  Her Bystolic was increased to 5 mg daily  Transient hypotension - Uses midodrine as needed.  Has not needed this in quite some time.  COPD - Has had severe obstructive COPD followed by Dr. 08/27/2020.  Currently taking Trelegy doing better.  Hyper vagal response - Careful with overmedication.  Peripheral vascular disease - Femoropopliteal bypass.  Trying to avoid future procedures.  Atrial flutter - Currently on Beverly [name], previously on amiodarone.  Statin intolerance - Continue with Zetia for medical management 10 mg a day prescription refills as needed.  4 mth     Medication Adjustments/Labs and Tests Ordered: Current medicines are reviewed at length with the patient today.  Concerns regarding medicines are outlined above.  No orders of the defined types were placed in this encounter.  Meds ordered this encounter  Medications  . nebivolol (BYSTOLIC) 10 MG tablet    Sig: Take 1 tablet (10 mg total) by mouth daily.    Dispense:  90 tablet    Refill:  3    Patient Instructions  Medication Instructions:  Please increase your Bystolic to 10 mg  a day. Continue all other medications as listed.  *If you need a refill on your cardiac medications before  your next appointment, please call your pharmacy*  Follow-Up: At [facility], you and your health needs are our priority.  As part of our continuing mission to provide you with exceptional heart care, we have created designated Provider Care Teams.  These Care Teams include your primary Cardiologist (physician) and Advanced Practice Providers (APPs -  Physician Assistants and Nurse Practitioners) who all work together to provide you with the care you need, when you need it.  We recommend signing up for the patient portal called "MyChart".  Sign up information is provided on this After Visit Summary.  MyChart is used to connect with patients for Virtual Visits (Telemedicine).  Patients are able to view lab/test results, encounter notes, upcoming appointments, etc.  Non-urgent messages can be sent to your provider as well.   To learn more about what you can do with MyChart, go to [email or web address].    Your next appointment:   3 to 4 month(s)  The format for your next appointment:   In Person  Provider:   Donato Schultz, MD  Thank you for choosing [facility]!!        Signed, Beverly Schultz, MD  09/12/2020 2:07 PM    [facility]

## 2020-09-12 NOTE — Patient Instructions (Signed)
Medication Instructions:  Please increase your Bystolic to 10 mg a day. Continue all other medications as listed.  *If you need a refill on your cardiac medications before your next appointment, please call your pharmacy*  Follow-Up: At [facility], you and your health needs are our priority.  As part of our continuing mission to provide you with exceptional heart care, we have created designated Provider Care Teams.  These Care Teams include your primary Cardiologist (physician) and Advanced Practice Providers (APPs -  Physician Assistants and Nurse Practitioners) who all work together to provide you with the care you need, when you need it.  We recommend signing up for the patient portal called "MyChart".  Sign up information is provided on this After Visit Summary.  MyChart is used to connect with patients for Virtual Visits (Telemedicine).  Patients are able to view lab/test results, encounter notes, upcoming appointments, etc.  Non-urgent messages can be sent to your provider as well.   To learn more about what you can do with MyChart, go to [email or web address].    Your next appointment:   3 to 4 month(s)  The format for your next appointment:   In Person  Provider:   Donato Schultz, MD  Thank you for choosing [facility]!!

## 2020-10-17 ENCOUNTER — Telehealth: Payer: Medicare Other | Admitting: Physician Assistant

## 2020-10-17 ENCOUNTER — Encounter: Payer: Self-pay | Admitting: Physician Assistant

## 2020-10-17 DIAGNOSIS — J441 Chronic obstructive pulmonary disease with (acute) exacerbation: Secondary | ICD-10-CM

## 2020-10-17 MED ORDER — AZITHROMYCIN 250 MG PO TABS: ORAL_TABLET | ORAL | 0 refills | Status: DC

## 2020-10-17 MED ORDER — BENZONATATE 100 MG PO CAPS: 100.0000 mg | ORAL_CAPSULE | Freq: Three times a day (TID) | ORAL | 0 refills | Status: DC | PRN

## 2020-10-17 MED ORDER — PREDNISONE 10 MG (21) PO TBPK: ORAL_TABLET | ORAL | 0 refills | Status: DC

## 2020-10-17 NOTE — Progress Notes (Signed)
[name] are scheduled for a virtual visit with your provider today.    Just as we do with appointments in the office, we must obtain your consent to participate.  Your consent will be active for this visit and any virtual visit you may have with one of our providers in the next 365 days.    If you have a MyChart account, I can also send a copy of this consent to you electronically.  All virtual visits are billed to your insurance company just like a traditional visit in the office.  As this is a virtual visit, video technology does not allow for your provider to perform a traditional examination.  This may limit your provider's ability to fully assess your condition.  If your provider identifies any concerns that need to be evaluated in person or the need to arrange testing such as labs, EKG, etc, we will make arrangements to do so.    Although advances in technology are sophisticated, we cannot ensure that it will always work on either your end or our end.  If the connection with a video visit is poor, we may have to switch to a telephone visit.  With either a video or telephone visit, we are not always able to ensure that we have a secure connection.   I need to obtain your verbal consent now.   Are you willing to proceed with your visit today?   Beverly Li has provided verbal consent on 10/17/2020 for a virtual visit (video or telephone).   Beverly Loveless, PA-C 10/17/2020  12:04 PM  Virtual Visit Consent   Beverly Li, you are scheduled for a virtual visit with a [facility] provider today.     Just as with appointments in the office, your consent must be obtained to participate.  Your consent will be active for this visit and any virtual visit you may have with one of our providers in the next 365 days.     If you have a MyChart account, a copy of this consent can be sent to you electronically.  All virtual visits are billed to your insurance company just like a traditional  visit in the office.    As this is a virtual visit, video technology does not allow for your provider to perform a traditional examination.  This may limit your provider's ability to fully assess your condition.  If your provider identifies any concerns that need to be evaluated in person or the need to arrange testing (such as labs, EKG, etc.), we will make arrangements to do so.     Although advances in technology are sophisticated, we cannot ensure that it will always work on either your end or our end.  If the connection with a video visit is poor, the visit may have to be switched to a telephone visit.  With either a video or telephone visit, we are not always able to ensure that we have a secure connection.     I need to obtain your verbal consent now.   Are you willing to proceed with your visit today?    Beverly Li has provided verbal consent on 10/17/2020 for a virtual visit (video or telephone).   Beverly Loveless, PA-C   Date: 10/17/2020 12:04 PM   Virtual Visit via Video Note   I, Beverly Li, connected with  Beverly Li  ([id number], Jan 23, 1946) on 10/17/20 at 11:45 AM EDT by a video-enabled telemedicine application and  verified that I am speaking with the correct person using two identifiers.  Location: Patient: Virtual Visit Location Patient: Home Provider: Virtual Visit Location Provider: Home Office   I discussed the limitations of evaluation and management by telemedicine and the availability of in person appointments. The patient expressed understanding and agreed to proceed.    History of Present Illness: Beverly Li is a 75 y.o. who identifies as a female who was assigned female at birth, and is being seen today for possible bronchitis.  HPI: Cough This is a new problem. The current episode started 1 to 4 weeks ago. The problem has been gradually worsening. The problem occurs every few minutes. The cough is Productive of sputum. Associated symptoms  include shortness of breath (minimal and with coughing spells) and wheezing. Pertinent negatives include no chills, hemoptysis, nasal congestion, postnasal drip or sweats. Nothing aggravates the symptoms. She has tried steroid inhaler, rest, leukotriene antagonists and ipratropium inhaler for the symptoms. The treatment provided no relief. Her past medical history is significant for COPD.     Problems:  Patient Active Problem List   Diagnosis Date Noted   Healthcare maintenance 04/27/2020   Sinus bradycardia 12/25/2019   COPD (chronic obstructive pulmonary disease) (HCC) 12/16/2018   Cough 12/16/2018   Unintentional weight loss 10/05/2018   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   Visit for monitoring Tikosyn therapy 11/18/2017   Closed nondisplaced fracture of head of radius with routine healing 03/03/2017   Coronary artery disease involving native coronary artery of native heart without angina pectoris    Chronic diastolic heart failure (HCC) 03/28/2015   Paroxysmal atrial fibrillation (HCC)    Essential hypertension    S/P femoral-popliteal bypass surgery    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   PAD (peripheral artery disease) (HCC) 03/02/2015   Claudication (HCC) 01/31/2015   Former smoker 01/31/2015   Hyperlipidemia 01/31/2015   Atherosclerosis of native arteries of extremity with intermittent claudication (HCC) 02/10/2014   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    Atenolol Cough   Crestor [Rosuvastatin Calcium] Other (See Comments)    Muscle aches   Other Other (See Comments)   Pravastatin Other (See Comments)    Muscle aches   Sulfa Antibiotics Nausea Only   Codeine     hallucinations   Medications:  Current Outpatient Medications:    azithromycin (ZITHROMAX) 250 MG tablet, Take 2 tablets PO on day one, and one tablet PO daily thereafter until completed., Disp: 6 tablet, Rfl: 0   benzonatate (TESSALON) 100 MG capsule, Take 1 capsule (100 mg total) by mouth 3 (three) times daily as needed., Disp: 30 capsule, Rfl: 0   predniSONE (STERAPRED UNI-PAK 21 TAB) 10 MG (21) TBPK tablet, 6 day taper; take as directed on package instructions, Disp: 21 tablet, Rfl: 0   acetaminophen (TYLENOL) 500 MG tablet, Take 1,500 mg by mouth every 6 (six) hours as needed (for  pain.)., Disp: , Rfl:    alendronate (FOSAMAX) 70 MG tablet, Take 70 mg by mouth every Monday. Take with a full glass of water on an empty stomach., Disp: , Rfl:    buPROPion (WELLBUTRIN XL) 150 MG 24 hr tablet, Take 150 mg by mouth every morning. , Disp: , Rfl:    Calcium-Phosphorus-Vitamin D (CALCIUM/D3 ADULT GUMMIES PO), Take 2 tablets by mouth daily at 2 PM., Disp: , Rfl:    DEXILANT 30 MG capsule, Take 1 capsule by mouth daily., Disp: , Rfl:    diltiazem (CARDIZEM) 30 MG tablet, Take 1 tablet (30 mg total) by mouth daily as needed (blood pressure higher than 160)., Disp: 90 tablet, Rfl: 3   dofetilide (TIKOSYN) 250 MCG capsule, TAKE 1 CAPSULE(250 MCG) BY MOUTH TWICE DAILY, Disp: 180 capsule, Rfl: 1   ELIQUIS 5 MG TABS tablet, TAKE 1 TABLET(5 MG) BY MOUTH TWICE DAILY, Disp: 180 tablet, Rfl: 1   ezetimibe (ZETIA) 10 MG tablet, TAKE 1 TABLET(10 MG) BY MOUTH DAILY, Disp: 90 tablet, Rfl: 3   hydrALAZINE (APRESOLINE) 25 MG tablet, TAKE 1 TABLET BY MOUTH EVERY 8 HOURS AS NEEDED FOR SYSTOLIC BLOOD  PRESSURE GREATER THAN 160, Disp: 90 tablet, Rfl: 8   midodrine (PROAMATINE) 10 MG tablet, TAKE 1 TABLET BY MOUTH THREE TIMES DAILY WITH MEALS AS NEEDED FOR BLOD PRESSURE LOWER THAN 100(SYSTOLIC), Disp: 90 tablet, Rfl: 7   Multiple Vitamins-Iron (STRESS B COMPLEX/IRON) TABS, Take 1 tablet by mouth at bedtime. , Disp: , Rfl:    nebivolol (BYSTOLIC) 10 MG tablet, Take 1 tablet (10 mg total) by mouth daily., Disp: 90 tablet, Rfl: 3   potassium chloride (KLOR-CON) 10 MEQ tablet, TAKE 1 TABLET(10 MEQ) BY MOUTH DAILY, Disp: 30 tablet, Rfl: 6   PROAIR HFA 108 (90 Base) MCG/ACT inhaler, Inhale 2 puffs into the lungs 4 (four) times daily as needed (for wheezing/shortness of breath.). , Disp: , Rfl: 1   TRELEGY ELLIPTA 100-62.5-25 MCG/INH AEPB, Take 1 puff by mouth daily., Disp: , Rfl:    zolpidem (AMBIEN) 10 MG tablet, Take 10 mg by mouth at bedtime as needed., Disp: , Rfl:   Observations/Objective: Patient is well-developed, well-nourished in no acute distress.  Resting comfortably at home.  Head is normocephalic, atraumatic.  No labored breathing. Dry, hacking cough heard x 1, did not effect speech Speech is clear and coherent with logical content.  Patient is alert and oriented at baseline.    Assessment and Plan: 1. Chronic obstructive pulmonary disease with acute exacerbation (HCC) - azithromycin (ZITHROMAX) 250 MG tablet; Take 2 tablets PO on day one, and one tablet PO daily thereafter until completed.  Dispense: 6 tablet; Refill: 0 - predniSONE (STERAPRED UNI-PAK 21 TAB) 10 MG (21) TBPK tablet; 6 day taper; take as directed on package instructions  Dispense: 21 tablet; Refill: 0 - benzonatate (TESSALON) 100 MG capsule; Take 1 capsule (100 mg total) by mouth 3 (three) times daily as needed.  Dispense: 30 capsule; Refill: 0 - Worsening despite treatments and prescribed inhalers.  - Will treat with zpak, prednisone and tessalon perles.  - Push fluids. Rest.  - Seek in person evaluation if  worsening.   Follow Up Instructions: I discussed the assessment and treatment plan with the patient. The patient was provided an opportunity to ask questions and all were answered. The patient agreed with the plan and demonstrated an understanding of the instructions.  A copy of instructions were sent to the patient via MyChart.  The patient was  advised to call back or seek an in-person evaluation if the symptoms worsen or if the condition fails to improve as anticipated.  Time:  I spent 11 minutes with the patient via telehealth technology discussing the above problems/concerns.    Beverly LovelessJennifer M Carrine Kroboth, PA-C

## 2020-10-17 NOTE — Patient Instructions (Signed)

## 2020-10-24 ENCOUNTER — Other Ambulatory Visit: Payer: Self-pay | Admitting: Internal Medicine

## 2020-10-24 NOTE — Telephone Encounter (Signed)
Prescription refill request for Eliquis received. Indication: PAF Last office visit: 09/12/20  Skains MD Scr: 0.96 on 08/16/20 Age: 75 Weight: 49.4kg  Based on above findings Eliquis 5mg  twice daily is the appropriate dose.  Refill approved.

## 2020-10-25 ENCOUNTER — Encounter: Payer: Self-pay | Admitting: Physician Assistant

## 2020-10-27 DIAGNOSIS — R062 Wheezing: Secondary | ICD-10-CM | POA: Diagnosis not present

## 2020-10-27 DIAGNOSIS — R059 Cough, unspecified: Secondary | ICD-10-CM | POA: Diagnosis not present

## 2020-11-04 DIAGNOSIS — D6832 Hemorrhagic disorder due to extrinsic circulating anticoagulants: Secondary | ICD-10-CM | POA: Diagnosis not present

## 2020-11-04 DIAGNOSIS — Z7901 Long term (current) use of anticoagulants: Secondary | ICD-10-CM | POA: Diagnosis not present

## 2020-11-04 DIAGNOSIS — I4891 Unspecified atrial fibrillation: Secondary | ICD-10-CM | POA: Diagnosis not present

## 2020-11-04 DIAGNOSIS — S301XXA Contusion of abdominal wall, initial encounter: Secondary | ICD-10-CM | POA: Diagnosis not present

## 2020-11-04 DIAGNOSIS — T45515A Adverse effect of anticoagulants, initial encounter: Secondary | ICD-10-CM | POA: Diagnosis not present

## 2020-11-04 DIAGNOSIS — J449 Chronic obstructive pulmonary disease, unspecified: Secondary | ICD-10-CM | POA: Diagnosis not present

## 2020-11-04 DIAGNOSIS — N200 Calculus of kidney: Secondary | ICD-10-CM | POA: Diagnosis not present

## 2020-11-04 DIAGNOSIS — I2699 Other pulmonary embolism without acute cor pulmonale: Secondary | ICD-10-CM | POA: Diagnosis not present

## 2020-11-04 DIAGNOSIS — F32A Depression, unspecified: Secondary | ICD-10-CM | POA: Diagnosis not present

## 2020-11-04 DIAGNOSIS — I422 Other hypertrophic cardiomyopathy: Secondary | ICD-10-CM | POA: Diagnosis not present

## 2020-11-04 DIAGNOSIS — I21A1 Myocardial infarction type 2: Secondary | ICD-10-CM | POA: Diagnosis not present

## 2020-11-04 DIAGNOSIS — F17210 Nicotine dependence, cigarettes, uncomplicated: Secondary | ICD-10-CM | POA: Diagnosis not present

## 2020-11-04 DIAGNOSIS — R7989 Other specified abnormal findings of blood chemistry: Secondary | ICD-10-CM | POA: Diagnosis not present

## 2020-11-04 DIAGNOSIS — R778 Other specified abnormalities of plasma proteins: Secondary | ICD-10-CM | POA: Diagnosis not present

## 2020-11-04 DIAGNOSIS — J440 Chronic obstructive pulmonary disease with acute lower respiratory infection: Secondary | ICD-10-CM | POA: Diagnosis not present

## 2020-11-04 DIAGNOSIS — Z9882 Breast implant status: Secondary | ICD-10-CM | POA: Diagnosis not present

## 2020-11-04 DIAGNOSIS — Z79899 Other long term (current) drug therapy: Secondary | ICD-10-CM | POA: Diagnosis not present

## 2020-11-04 DIAGNOSIS — S20222A Contusion of left back wall of thorax, initial encounter: Secondary | ICD-10-CM | POA: Diagnosis not present

## 2020-11-04 DIAGNOSIS — I214 Non-ST elevation (NSTEMI) myocardial infarction: Secondary | ICD-10-CM | POA: Diagnosis not present

## 2020-11-04 DIAGNOSIS — S300XXA Contusion of lower back and pelvis, initial encounter: Secondary | ICD-10-CM | POA: Diagnosis not present

## 2020-11-04 DIAGNOSIS — I482 Chronic atrial fibrillation, unspecified: Secondary | ICD-10-CM | POA: Diagnosis not present

## 2020-11-04 DIAGNOSIS — D72829 Elevated white blood cell count, unspecified: Secondary | ICD-10-CM | POA: Diagnosis not present

## 2020-11-07 ENCOUNTER — Telehealth: Payer: Self-pay | Admitting: Cardiology

## 2020-11-07 DIAGNOSIS — R233 Spontaneous ecchymoses: Secondary | ICD-10-CM | POA: Diagnosis not present

## 2020-11-07 DIAGNOSIS — M545 Low back pain, unspecified: Secondary | ICD-10-CM | POA: Diagnosis not present

## 2020-11-07 NOTE — Telephone Encounter (Signed)
  Per patient schedule MyChart message:  Need an appointment asap. Returned from a lovely weekend here at [employer]. Thelma Barge with Afib & the largest hematoma you have ever seen from coughing. Eliquis on hold for 4+ days. Can you get me in????  Thanks, Beverly Li.  Patient has an appt scheduled with 01/12/21 with Dr Anne Fu. Could not find anything any earlier.

## 2020-11-07 NOTE — Telephone Encounter (Signed)
Spoke with pt. She was recently hospitalized with a large hematoma secondary to coughing and eliqus. She was admitted to [facility] in [place]. [place]. While hospitalized, went into AF RVR and was kept for a couple of days to manage. No medications were changed at discharge. She is feeling okay today. HR is <110 and she remains in AF. She was advised to follow up with cardiology for AF management and when to restart anticoagulation.   I was able to schedule pt with Otilio Saber, PA next week for hospital follow up. She will bring her hospital records tomorrow to be placed in his mailbox.   She needs to know when to begin back on her anticoagulation. Dr. Graciela Husbands is out of the office this week, so I will defer to Dr. Anne Fu for recommendation and follow up.   ED precautions were given as well.

## 2020-11-08 ENCOUNTER — Other Ambulatory Visit: Payer: Self-pay | Admitting: Student

## 2020-11-08 DIAGNOSIS — I10 Essential (primary) hypertension: Secondary | ICD-10-CM | POA: Diagnosis not present

## 2020-11-08 DIAGNOSIS — I480 Paroxysmal atrial fibrillation: Secondary | ICD-10-CM | POA: Diagnosis not present

## 2020-11-08 DIAGNOSIS — E7800 Pure hypercholesterolemia, unspecified: Secondary | ICD-10-CM | POA: Diagnosis not present

## 2020-11-08 DIAGNOSIS — G4700 Insomnia, unspecified: Secondary | ICD-10-CM | POA: Diagnosis not present

## 2020-11-08 DIAGNOSIS — J449 Chronic obstructive pulmonary disease, unspecified: Secondary | ICD-10-CM | POA: Diagnosis not present

## 2020-11-08 DIAGNOSIS — E785 Hyperlipidemia, unspecified: Secondary | ICD-10-CM | POA: Diagnosis not present

## 2020-11-08 DIAGNOSIS — M810 Age-related osteoporosis without current pathological fracture: Secondary | ICD-10-CM | POA: Diagnosis not present

## 2020-11-08 DIAGNOSIS — K219 Gastro-esophageal reflux disease without esophagitis: Secondary | ICD-10-CM | POA: Diagnosis not present

## 2020-11-08 NOTE — Telephone Encounter (Signed)
Noted  

## 2020-11-08 NOTE — Telephone Encounter (Signed)
Discussed at length with Dr. Graciela Husbands.   STOP tikosyn with diminishing effect over the past several months and currently being off OAC.   Continue to hold [facility] for at least a couple of more weeks as her hematoma resolves. She denies history of thrombo-embolic events.   Plan follow up next week to update labwork, EKG, physical exam, and Echo for further rhythm vs rate control.   All above discussed with patient who agrees and verbalizes understanding to STOP tikosyn and remain off Eliquis for the time being.   Casimiro Needle [address]" [place], PA-C  11/08/2020 2:05 PM

## 2020-11-08 NOTE — Telephone Encounter (Signed)
Attempted to call patient for clarifying questions.   LMOM

## 2020-11-13 ENCOUNTER — Encounter (HOSPITAL_COMMUNITY)
Admission: EM | Disposition: A | Discharge status: Skilled Nursing Facility | Payer: Self-pay | Source: Home / Self Care | Attending: Neurology

## 2020-11-13 ENCOUNTER — Emergency Department (HOSPITAL_COMMUNITY): Payer: Medicare Other | Admitting: Anesthesiology

## 2020-11-13 ENCOUNTER — Emergency Department (HOSPITAL_COMMUNITY): Payer: Medicare Other

## 2020-11-13 ENCOUNTER — Inpatient Hospital Stay (HOSPITAL_COMMUNITY)
Admission: EM | Admit: 2020-11-13 | Discharge: 2020-12-06 | DRG: 023 | Disposition: A | Discharge status: Skilled Nursing Facility | Payer: Medicare Other | Attending: Family Medicine | Admitting: Family Medicine

## 2020-11-13 ENCOUNTER — Inpatient Hospital Stay (HOSPITAL_COMMUNITY): Payer: Medicare Other

## 2020-11-13 ENCOUNTER — Telehealth: Payer: Self-pay | Admitting: Cardiology

## 2020-11-13 DIAGNOSIS — M170 Bilateral primary osteoarthritis of knee: Secondary | ICD-10-CM | POA: Diagnosis present

## 2020-11-13 DIAGNOSIS — I701 Atherosclerosis of renal artery: Secondary | ICD-10-CM | POA: Diagnosis not present

## 2020-11-13 DIAGNOSIS — G510 Bell's palsy: Secondary | ICD-10-CM | POA: Diagnosis not present

## 2020-11-13 DIAGNOSIS — I421 Obstructive hypertrophic cardiomyopathy: Secondary | ICD-10-CM

## 2020-11-13 DIAGNOSIS — J9819 Other pulmonary collapse: Secondary | ICD-10-CM | POA: Diagnosis not present

## 2020-11-13 DIAGNOSIS — I69391 Dysphagia following cerebral infarction: Secondary | ICD-10-CM | POA: Diagnosis not present

## 2020-11-13 DIAGNOSIS — I63412 Cerebral infarction due to embolism of left middle cerebral artery: Principal | ICD-10-CM | POA: Diagnosis present

## 2020-11-13 DIAGNOSIS — E876 Hypokalemia: Secondary | ICD-10-CM | POA: Diagnosis present

## 2020-11-13 DIAGNOSIS — Z882 Allergy status to sulfonamides status: Secondary | ICD-10-CM

## 2020-11-13 DIAGNOSIS — Z888 Allergy status to other drugs, medicaments and biological substances status: Secondary | ICD-10-CM

## 2020-11-13 DIAGNOSIS — J810 Acute pulmonary edema: Secondary | ICD-10-CM | POA: Diagnosis not present

## 2020-11-13 DIAGNOSIS — Z4682 Encounter for fitting and adjustment of non-vascular catheter: Secondary | ICD-10-CM | POA: Diagnosis not present

## 2020-11-13 DIAGNOSIS — R29726 NIHSS score 26: Secondary | ICD-10-CM | POA: Diagnosis present

## 2020-11-13 DIAGNOSIS — I4819 Other persistent atrial fibrillation: Secondary | ICD-10-CM | POA: Diagnosis present

## 2020-11-13 DIAGNOSIS — Z931 Gastrostomy status: Secondary | ICD-10-CM | POA: Diagnosis not present

## 2020-11-13 DIAGNOSIS — M48062 Spinal stenosis, lumbar region with neurogenic claudication: Secondary | ICD-10-CM | POA: Diagnosis not present

## 2020-11-13 DIAGNOSIS — I4891 Unspecified atrial fibrillation: Secondary | ICD-10-CM | POA: Diagnosis not present

## 2020-11-13 DIAGNOSIS — R4701 Aphasia: Secondary | ICD-10-CM | POA: Diagnosis present

## 2020-11-13 DIAGNOSIS — Z681 Body mass index (BMI) 19 or less, adult: Secondary | ICD-10-CM | POA: Diagnosis not present

## 2020-11-13 DIAGNOSIS — Z01818 Encounter for other preprocedural examination: Secondary | ICD-10-CM

## 2020-11-13 DIAGNOSIS — I2721 Secondary pulmonary arterial hypertension: Secondary | ICD-10-CM | POA: Diagnosis not present

## 2020-11-13 DIAGNOSIS — I161 Hypertensive emergency: Secondary | ICD-10-CM | POA: Diagnosis present

## 2020-11-13 DIAGNOSIS — I70202 Unspecified atherosclerosis of native arteries of extremities, left leg: Secondary | ICD-10-CM | POA: Diagnosis present

## 2020-11-13 DIAGNOSIS — Z0189 Encounter for other specified special examinations: Secondary | ICD-10-CM

## 2020-11-13 DIAGNOSIS — I5032 Chronic diastolic (congestive) heart failure: Secondary | ICD-10-CM | POA: Diagnosis not present

## 2020-11-13 DIAGNOSIS — I110 Hypertensive heart disease with heart failure: Secondary | ICD-10-CM | POA: Diagnosis present

## 2020-11-13 DIAGNOSIS — J9811 Atelectasis: Secondary | ICD-10-CM | POA: Diagnosis not present

## 2020-11-13 DIAGNOSIS — G8191 Hemiplegia, unspecified affecting right dominant side: Secondary | ICD-10-CM | POA: Diagnosis not present

## 2020-11-13 DIAGNOSIS — D72829 Elevated white blood cell count, unspecified: Secondary | ICD-10-CM | POA: Diagnosis not present

## 2020-11-13 DIAGNOSIS — E873 Alkalosis: Secondary | ICD-10-CM | POA: Diagnosis present

## 2020-11-13 DIAGNOSIS — I6521 Occlusion and stenosis of right carotid artery: Secondary | ICD-10-CM | POA: Diagnosis not present

## 2020-11-13 DIAGNOSIS — R069 Unspecified abnormalities of breathing: Secondary | ICD-10-CM

## 2020-11-13 DIAGNOSIS — J90 Pleural effusion, not elsewhere classified: Secondary | ICD-10-CM | POA: Diagnosis not present

## 2020-11-13 DIAGNOSIS — I639 Cerebral infarction, unspecified: Secondary | ICD-10-CM | POA: Diagnosis present

## 2020-11-13 DIAGNOSIS — R0902 Hypoxemia: Secondary | ICD-10-CM | POA: Diagnosis not present

## 2020-11-13 DIAGNOSIS — I6389 Other cerebral infarction: Secondary | ICD-10-CM | POA: Diagnosis not present

## 2020-11-13 DIAGNOSIS — I6523 Occlusion and stenosis of bilateral carotid arteries: Secondary | ICD-10-CM | POA: Diagnosis not present

## 2020-11-13 DIAGNOSIS — R339 Retention of urine, unspecified: Secondary | ICD-10-CM | POA: Diagnosis not present

## 2020-11-13 DIAGNOSIS — D6832 Hemorrhagic disorder due to extrinsic circulating anticoagulants: Secondary | ICD-10-CM | POA: Diagnosis not present

## 2020-11-13 DIAGNOSIS — I480 Paroxysmal atrial fibrillation: Secondary | ICD-10-CM | POA: Diagnosis not present

## 2020-11-13 DIAGNOSIS — I6602 Occlusion and stenosis of left middle cerebral artery: Secondary | ICD-10-CM

## 2020-11-13 DIAGNOSIS — M47816 Spondylosis without myelopathy or radiculopathy, lumbar region: Secondary | ICD-10-CM | POA: Diagnosis present

## 2020-11-13 DIAGNOSIS — I5033 Acute on chronic diastolic (congestive) heart failure: Secondary | ICD-10-CM | POA: Diagnosis not present

## 2020-11-13 DIAGNOSIS — R404 Transient alteration of awareness: Secondary | ICD-10-CM | POA: Diagnosis not present

## 2020-11-13 DIAGNOSIS — I422 Other hypertrophic cardiomyopathy: Secondary | ICD-10-CM | POA: Diagnosis not present

## 2020-11-13 DIAGNOSIS — R0603 Acute respiratory distress: Secondary | ICD-10-CM | POA: Diagnosis not present

## 2020-11-13 DIAGNOSIS — R2681 Unsteadiness on feet: Secondary | ICD-10-CM | POA: Diagnosis not present

## 2020-11-13 DIAGNOSIS — I69351 Hemiplegia and hemiparesis following cerebral infarction affecting right dominant side: Secondary | ICD-10-CM | POA: Diagnosis not present

## 2020-11-13 DIAGNOSIS — E870 Hyperosmolality and hypernatremia: Secondary | ICD-10-CM | POA: Diagnosis not present

## 2020-11-13 DIAGNOSIS — R4020 Unspecified coma: Secondary | ICD-10-CM | POA: Diagnosis not present

## 2020-11-13 DIAGNOSIS — R0602 Shortness of breath: Secondary | ICD-10-CM

## 2020-11-13 DIAGNOSIS — R58 Hemorrhage, not elsewhere classified: Secondary | ICD-10-CM | POA: Diagnosis not present

## 2020-11-13 DIAGNOSIS — R5381 Other malaise: Secondary | ICD-10-CM | POA: Diagnosis not present

## 2020-11-13 DIAGNOSIS — Z20822 Contact with and (suspected) exposure to covid-19: Secondary | ICD-10-CM | POA: Diagnosis present

## 2020-11-13 DIAGNOSIS — R1310 Dysphagia, unspecified: Secondary | ICD-10-CM | POA: Diagnosis not present

## 2020-11-13 DIAGNOSIS — I743 Embolism and thrombosis of arteries of the lower extremities: Secondary | ICD-10-CM | POA: Diagnosis not present

## 2020-11-13 DIAGNOSIS — I63522 Cerebral infarction due to unspecified occlusion or stenosis of left anterior cerebral artery: Secondary | ICD-10-CM | POA: Diagnosis present

## 2020-11-13 DIAGNOSIS — Z79899 Other long term (current) drug therapy: Secondary | ICD-10-CM

## 2020-11-13 DIAGNOSIS — Z7951 Long term (current) use of inhaled steroids: Secondary | ICD-10-CM

## 2020-11-13 DIAGNOSIS — R569 Unspecified convulsions: Secondary | ICD-10-CM | POA: Diagnosis not present

## 2020-11-13 DIAGNOSIS — J9601 Acute respiratory failure with hypoxia: Secondary | ICD-10-CM | POA: Diagnosis not present

## 2020-11-13 DIAGNOSIS — Z87891 Personal history of nicotine dependence: Secondary | ICD-10-CM | POA: Diagnosis not present

## 2020-11-13 DIAGNOSIS — Z885 Allergy status to narcotic agent status: Secondary | ICD-10-CM

## 2020-11-13 DIAGNOSIS — Z452 Encounter for adjustment and management of vascular access device: Secondary | ICD-10-CM | POA: Diagnosis not present

## 2020-11-13 DIAGNOSIS — Z981 Arthrodesis status: Secondary | ICD-10-CM

## 2020-11-13 DIAGNOSIS — R209 Unspecified disturbances of skin sensation: Secondary | ICD-10-CM | POA: Diagnosis not present

## 2020-11-13 DIAGNOSIS — Z431 Encounter for attention to gastrostomy: Secondary | ICD-10-CM | POA: Diagnosis not present

## 2020-11-13 DIAGNOSIS — Z8249 Family history of ischemic heart disease and other diseases of the circulatory system: Secondary | ICD-10-CM

## 2020-11-13 DIAGNOSIS — Z9289 Personal history of other medical treatment: Secondary | ICD-10-CM

## 2020-11-13 DIAGNOSIS — E785 Hyperlipidemia, unspecified: Secondary | ICD-10-CM | POA: Diagnosis present

## 2020-11-13 DIAGNOSIS — R1311 Dysphagia, oral phase: Secondary | ICD-10-CM | POA: Diagnosis present

## 2020-11-13 DIAGNOSIS — E7800 Pure hypercholesterolemia, unspecified: Secondary | ICD-10-CM | POA: Diagnosis not present

## 2020-11-13 DIAGNOSIS — I499 Cardiac arrhythmia, unspecified: Secondary | ICD-10-CM | POA: Diagnosis not present

## 2020-11-13 DIAGNOSIS — R1909 Other intra-abdominal and pelvic swelling, mass and lump: Secondary | ICD-10-CM | POA: Diagnosis not present

## 2020-11-13 DIAGNOSIS — T82858A Stenosis of vascular prosthetic devices, implants and grafts, initial encounter: Secondary | ICD-10-CM | POA: Diagnosis not present

## 2020-11-13 DIAGNOSIS — J690 Pneumonitis due to inhalation of food and vomit: Secondary | ICD-10-CM | POA: Diagnosis not present

## 2020-11-13 DIAGNOSIS — R0682 Tachypnea, not elsewhere classified: Secondary | ICD-10-CM

## 2020-11-13 DIAGNOSIS — Y838 Other surgical procedures as the cause of abnormal reaction of the patient, or of later complication, without mention of misadventure at the time of the procedure: Secondary | ICD-10-CM | POA: Diagnosis not present

## 2020-11-13 DIAGNOSIS — J449 Chronic obstructive pulmonary disease, unspecified: Secondary | ICD-10-CM | POA: Diagnosis not present

## 2020-11-13 DIAGNOSIS — H518 Other specified disorders of binocular movement: Secondary | ICD-10-CM | POA: Diagnosis present

## 2020-11-13 DIAGNOSIS — Z803 Family history of malignant neoplasm of breast: Secondary | ICD-10-CM

## 2020-11-13 DIAGNOSIS — R7303 Prediabetes: Secondary | ICD-10-CM | POA: Diagnosis present

## 2020-11-13 DIAGNOSIS — G459 Transient cerebral ischemic attack, unspecified: Secondary | ICD-10-CM | POA: Diagnosis not present

## 2020-11-13 DIAGNOSIS — I350 Nonrheumatic aortic (valve) stenosis: Secondary | ICD-10-CM | POA: Diagnosis not present

## 2020-11-13 DIAGNOSIS — J811 Chronic pulmonary edema: Secondary | ICD-10-CM | POA: Diagnosis not present

## 2020-11-13 DIAGNOSIS — I700 Atherosclerosis of aorta: Secondary | ICD-10-CM | POA: Diagnosis not present

## 2020-11-13 DIAGNOSIS — Z9889 Other specified postprocedural states: Secondary | ICD-10-CM | POA: Diagnosis not present

## 2020-11-13 DIAGNOSIS — M160 Bilateral primary osteoarthritis of hip: Secondary | ICD-10-CM | POA: Diagnosis not present

## 2020-11-13 DIAGNOSIS — I63512 Cerebral infarction due to unspecified occlusion or stenosis of left middle cerebral artery: Secondary | ICD-10-CM

## 2020-11-13 DIAGNOSIS — E43 Unspecified severe protein-calorie malnutrition: Secondary | ICD-10-CM | POA: Diagnosis not present

## 2020-11-13 DIAGNOSIS — R092 Respiratory arrest: Secondary | ICD-10-CM | POA: Diagnosis not present

## 2020-11-13 DIAGNOSIS — I6503 Occlusion and stenosis of bilateral vertebral arteries: Secondary | ICD-10-CM | POA: Diagnosis not present

## 2020-11-13 DIAGNOSIS — Z880 Allergy status to penicillin: Secondary | ICD-10-CM

## 2020-11-13 DIAGNOSIS — Z800 Family history of malignant neoplasm of digestive organs: Secondary | ICD-10-CM

## 2020-11-13 DIAGNOSIS — R231 Pallor: Secondary | ICD-10-CM

## 2020-11-13 DIAGNOSIS — I63231 Cerebral infarction due to unspecified occlusion or stenosis of right carotid arteries: Secondary | ICD-10-CM | POA: Diagnosis not present

## 2020-11-13 DIAGNOSIS — S301XXA Contusion of abdominal wall, initial encounter: Secondary | ICD-10-CM | POA: Diagnosis present

## 2020-11-13 DIAGNOSIS — L8990 Pressure ulcer of unspecified site, unspecified stage: Secondary | ICD-10-CM | POA: Insufficient documentation

## 2020-11-13 DIAGNOSIS — Z801 Family history of malignant neoplasm of trachea, bronchus and lung: Secondary | ICD-10-CM

## 2020-11-13 DIAGNOSIS — Z743 Need for continuous supervision: Secondary | ICD-10-CM | POA: Diagnosis not present

## 2020-11-13 DIAGNOSIS — J9621 Acute and chronic respiratory failure with hypoxia: Secondary | ICD-10-CM | POA: Diagnosis not present

## 2020-11-13 DIAGNOSIS — J9690 Respiratory failure, unspecified, unspecified whether with hypoxia or hypercapnia: Secondary | ICD-10-CM | POA: Diagnosis not present

## 2020-11-13 DIAGNOSIS — E46 Unspecified protein-calorie malnutrition: Secondary | ICD-10-CM | POA: Diagnosis not present

## 2020-11-13 DIAGNOSIS — I739 Peripheral vascular disease, unspecified: Secondary | ICD-10-CM | POA: Diagnosis not present

## 2020-11-13 DIAGNOSIS — I2510 Atherosclerotic heart disease of native coronary artery without angina pectoris: Secondary | ICD-10-CM | POA: Diagnosis present

## 2020-11-13 DIAGNOSIS — M8580 Other specified disorders of bone density and structure, unspecified site: Secondary | ICD-10-CM | POA: Diagnosis present

## 2020-11-13 DIAGNOSIS — R34 Anuria and oliguria: Secondary | ICD-10-CM | POA: Diagnosis not present

## 2020-11-13 DIAGNOSIS — R1312 Dysphagia, oropharyngeal phase: Secondary | ICD-10-CM | POA: Diagnosis not present

## 2020-11-13 DIAGNOSIS — Z95828 Presence of other vascular implants and grafts: Secondary | ICD-10-CM | POA: Diagnosis not present

## 2020-11-13 DIAGNOSIS — Z8673 Personal history of transient ischemic attack (TIA), and cerebral infarction without residual deficits: Secondary | ICD-10-CM | POA: Diagnosis not present

## 2020-11-13 DIAGNOSIS — K219 Gastro-esophageal reflux disease without esophagitis: Secondary | ICD-10-CM | POA: Diagnosis not present

## 2020-11-13 DIAGNOSIS — Z7901 Long term (current) use of anticoagulants: Secondary | ICD-10-CM

## 2020-11-13 DIAGNOSIS — I70402 Unspecified atherosclerosis of autologous vein bypass graft(s) of the extremities, left leg: Secondary | ICD-10-CM | POA: Diagnosis not present

## 2020-11-13 DIAGNOSIS — J439 Emphysema, unspecified: Secondary | ICD-10-CM | POA: Diagnosis not present

## 2020-11-13 DIAGNOSIS — R001 Bradycardia, unspecified: Secondary | ICD-10-CM | POA: Diagnosis present

## 2020-11-13 DIAGNOSIS — R6889 Other general symptoms and signs: Secondary | ICD-10-CM | POA: Diagnosis not present

## 2020-11-13 DIAGNOSIS — Z4659 Encounter for fitting and adjustment of other gastrointestinal appliance and device: Secondary | ICD-10-CM | POA: Diagnosis not present

## 2020-11-13 DIAGNOSIS — I10 Essential (primary) hypertension: Secondary | ICD-10-CM | POA: Diagnosis not present

## 2020-11-13 DIAGNOSIS — F419 Anxiety disorder, unspecified: Secondary | ICD-10-CM | POA: Diagnosis present

## 2020-11-13 DIAGNOSIS — Z7401 Bed confinement status: Secondary | ICD-10-CM | POA: Diagnosis not present

## 2020-11-13 DIAGNOSIS — I517 Cardiomegaly: Secondary | ICD-10-CM | POA: Diagnosis not present

## 2020-11-13 DIAGNOSIS — Z7952 Long term (current) use of systemic steroids: Secondary | ICD-10-CM

## 2020-11-13 DIAGNOSIS — I951 Orthostatic hypotension: Secondary | ICD-10-CM | POA: Diagnosis present

## 2020-11-13 HISTORY — PX: IR CT HEAD LTD: IMG2386

## 2020-11-13 HISTORY — PX: IR PERCUTANEOUS ART THROMBECTOMY/INFUSION INTRACRANIAL INC DIAG ANGIO: IMG6087

## 2020-11-13 HISTORY — PX: RADIOLOGY WITH ANESTHESIA: SHX6223

## 2020-11-13 LAB — BASIC METABOLIC PANEL
Anion gap: 10 (ref 5–15)
BUN: 23 mg/dL (ref 8–23)
CO2: 22 mmol/L (ref 22–32)
Calcium: 8 mg/dL — ABNORMAL LOW (ref 8.9–10.3)
Chloride: 105 mmol/L (ref 98–111)
Creatinine, Ser: 0.82 mg/dL (ref 0.44–1.00)
GFR, Estimated: >60 mL/min (ref >60)
Glucose, Bld: 142 mg/dL — ABNORMAL HIGH (ref 70–99)
Potassium: 3.4 mmol/L — ABNORMAL LOW (ref 3.5–5.1)
Sodium: 137 mmol/L (ref 135–145)

## 2020-11-13 LAB — CBC
HCT: 44.2 % (ref 36.0–46.0)
Hemoglobin: 14.1 g/dL (ref 12.0–15.0)
MCH: 31.1 pg (ref 26.0–34.0)
MCHC: 31.9 g/dL (ref 30.0–36.0)
MCV: 97.6 fL (ref 80.0–100.0)
Platelets: 240 K/uL (ref 150–400)
RBC: 4.53 MIL/uL (ref 3.87–5.11)
RDW: 18.3 % — ABNORMAL HIGH (ref 11.5–15.5)
WBC: 9.7 K/uL (ref 4.0–10.5)
nRBC: 0 % (ref 0.0–0.2)

## 2020-11-13 LAB — I-STAT CHEM 8, ED
BUN: 28 mg/dL — ABNORMAL HIGH (ref 8–23)
Calcium, Ion: 0.97 mmol/L — ABNORMAL LOW (ref 1.15–1.40)
Chloride: 108 mmol/L (ref 98–111)
Creatinine, Ser: 0.6 mg/dL (ref 0.44–1.00)
Glucose, Bld: 89 mg/dL (ref 70–99)
HCT: 41 % (ref 36.0–46.0)
Hemoglobin: 13.9 g/dL (ref 12.0–15.0)
Potassium: 3.5 mmol/L (ref 3.5–5.1)
Sodium: 141 mmol/L (ref 135–145)
TCO2: 27 mmol/L (ref 22–32)

## 2020-11-13 LAB — POCT I-STAT 7, (LYTES, BLD GAS, ICA,H+H)
Acid-Base Excess: 0 mmol/L (ref 0.0–2.0)
Bicarbonate: 26.3 mmol/L (ref 20.0–28.0)
Calcium, Ion: 1.15 mmol/L (ref 1.15–1.40)
HCT: 41 % (ref 36.0–46.0)
Hemoglobin: 13.9 g/dL (ref 12.0–15.0)
O2 Saturation: 90 %
Patient temperature: 97.4
Potassium: 3.3 mmol/L — ABNORMAL LOW (ref 3.5–5.1)
Sodium: 141 mmol/L (ref 135–145)
TCO2: 28 mmol/L (ref 22–32)
pCO2 arterial: 45.7 mmHg (ref 32.0–48.0)
pH, Arterial: 7.365 (ref 7.350–7.450)
pO2, Arterial: 59 mmHg — ABNORMAL LOW (ref 83.0–108.0)

## 2020-11-13 LAB — COMPREHENSIVE METABOLIC PANEL WITH GFR
ALT: 25 U/L (ref 0–44)
AST: 48 U/L — ABNORMAL HIGH (ref 15–41)
Albumin: 3.1 g/dL — ABNORMAL LOW (ref 3.5–5.0)
Alkaline Phosphatase: 60 U/L (ref 38–126)
Anion gap: 9 (ref 5–15)
BUN: 24 mg/dL — ABNORMAL HIGH (ref 8–23)
CO2: 27 mmol/L (ref 22–32)
Calcium: 8.7 mg/dL — ABNORMAL LOW (ref 8.9–10.3)
Chloride: 105 mmol/L (ref 98–111)
Creatinine, Ser: 0.77 mg/dL (ref 0.44–1.00)
GFR, Estimated: >60 mL/min
Glucose, Bld: 88 mg/dL (ref 70–99)
Potassium: 4.2 mmol/L (ref 3.5–5.1)
Sodium: 141 mmol/L (ref 135–145)
Total Bilirubin: 1.7 mg/dL — ABNORMAL HIGH (ref 0.3–1.2)
Total Protein: 5.7 g/dL — ABNORMAL LOW (ref 6.5–8.1)

## 2020-11-13 LAB — DIFFERENTIAL
Abs Immature Granulocytes: 0.05 K/uL (ref 0.00–0.07)
Basophils Absolute: 0 K/uL (ref 0.0–0.1)
Basophils Relative: 0 %
Eosinophils Absolute: 0.1 K/uL (ref 0.0–0.5)
Eosinophils Relative: 1 %
Immature Granulocytes: 1 %
Lymphocytes Relative: 17 %
Lymphs Abs: 1.7 K/uL (ref 0.7–4.0)
Monocytes Absolute: 0.6 K/uL (ref 0.1–1.0)
Monocytes Relative: 6 %
Neutro Abs: 7.3 K/uL (ref 1.7–7.7)
Neutrophils Relative %: 75 %

## 2020-11-13 LAB — RAPID URINE DRUG SCREEN, HOSP PERFORMED
Amphetamines: NOT DETECTED
Barbiturates: NOT DETECTED
Benzodiazepines: NOT DETECTED
Cocaine: NOT DETECTED
Opiates: NOT DETECTED
Tetrahydrocannabinol: NOT DETECTED

## 2020-11-13 LAB — MAGNESIUM: Magnesium: 2 mg/dL (ref 1.7–2.4)

## 2020-11-13 LAB — URINALYSIS, ROUTINE W REFLEX MICROSCOPIC
Bacteria, UA: NONE SEEN
Bilirubin Urine: NEGATIVE
Glucose, UA: NEGATIVE mg/dL
Ketones, ur: 5 mg/dL — AB
Leukocytes,Ua: NEGATIVE
Nitrite: NEGATIVE
Protein, ur: NEGATIVE mg/dL
Specific Gravity, Urine: 1.045 — ABNORMAL HIGH (ref 1.005–1.030)
pH: 7 (ref 5.0–8.0)

## 2020-11-13 LAB — CBG MONITORING, ED: Glucose-Capillary: 93 mg/dL (ref 70–99)

## 2020-11-13 LAB — MRSA NEXT GEN BY PCR, NASAL: MRSA by PCR Next Gen: NOT DETECTED

## 2020-11-13 LAB — HEMOGLOBIN A1C
Hgb A1c MFr Bld: 6 % — ABNORMAL HIGH (ref 4.8–5.6)
Mean Plasma Glucose: 125.5 mg/dL

## 2020-11-13 LAB — LIPID PANEL
Cholesterol: 209 mg/dL — ABNORMAL HIGH (ref 0–200)
HDL: 70 mg/dL (ref >40)
LDL Cholesterol: 120 mg/dL — ABNORMAL HIGH (ref 0–99)
Total CHOL/HDL Ratio: 3 RATIO
Triglycerides: 97 mg/dL (ref <150)
VLDL: 19 mg/dL (ref 0–40)

## 2020-11-13 LAB — GLUCOSE, CAPILLARY: Glucose-Capillary: 111 mg/dL — ABNORMAL HIGH (ref 70–99)

## 2020-11-13 LAB — PROTIME-INR
INR: 1 (ref 0.8–1.2)
Prothrombin Time: 12.9 seconds (ref 11.4–15.2)

## 2020-11-13 LAB — ETHANOL: Alcohol, Ethyl (B): <10 mg/dL

## 2020-11-13 LAB — APTT: aPTT: 27 s (ref 24–36)

## 2020-11-13 LAB — RESP PANEL BY RT-PCR (FLU A&B, COVID) ARPGX2
Influenza A by PCR: NEGATIVE
Influenza B by PCR: NEGATIVE
SARS Coronavirus 2 by RT PCR: NEGATIVE

## 2020-11-13 SURGERY — IR WITH ANESTHESIA
Anesthesia: General

## 2020-11-13 MED ORDER — CLEVIDIPINE BUTYRATE 0.5 MG/ML IV EMUL
INTRAVENOUS | Status: AC
  Filled 2020-11-13: qty 50

## 2020-11-13 MED ORDER — CEFAZOLIN SODIUM-DEXTROSE 2-3 GM-%(50ML) IV SOLR
INTRAVENOUS | Status: DC | PRN
  Administered 2020-11-13: 2 g via INTRAVENOUS

## 2020-11-13 MED ORDER — ACETAMINOPHEN 650 MG RE SUPP
650.0000 mg | RECTAL | Status: DC | PRN
  Administered 2020-11-15: 650 mg via RECTAL
  Filled 2020-11-13: qty 1

## 2020-11-13 MED ORDER — ENOXAPARIN SODIUM 40 MG/0.4ML IJ SOSY
40.0000 mg | PREFILLED_SYRINGE | Freq: Every day | INTRAMUSCULAR | Status: DC
  Administered 2020-11-14: 40 mg via SUBCUTANEOUS
  Filled 2020-11-13: qty 0.4

## 2020-11-13 MED ORDER — REVEFENACIN 175 MCG/3ML IN SOLN
175.0000 ug | Freq: Every day | RESPIRATORY_TRACT | Status: DC
  Administered 2020-11-14 – 2020-12-06 (×22): 175 ug via RESPIRATORY_TRACT
  Filled 2020-11-13 (×24): qty 3

## 2020-11-13 MED ORDER — IOHEXOL 300 MG/ML  SOLN
150.0000 mL | Freq: Once | INTRAMUSCULAR | Status: AC | PRN
  Administered 2020-11-13: 60 mL

## 2020-11-13 MED ORDER — ACETAMINOPHEN 650 MG RE SUPP: 650.0000 mg | RECTAL | Status: DC | PRN

## 2020-11-13 MED ORDER — ROCURONIUM BROMIDE 10 MG/ML (PF) SYRINGE
PREFILLED_SYRINGE | INTRAVENOUS | Status: DC | PRN
  Administered 2020-11-13: 50 mg via INTRAVENOUS
  Administered 2020-11-13: 30 mg via INTRAVENOUS

## 2020-11-13 MED ORDER — CHLORHEXIDINE GLUCONATE CLOTH 2 % EX PADS
6.0000 | MEDICATED_PAD | Freq: Every day | CUTANEOUS | Status: DC
  Administered 2020-11-13 – 2020-11-27 (×16): 6 via TOPICAL

## 2020-11-13 MED ORDER — LIDOCAINE 2% (20 MG/ML) 5 ML SYRINGE
INTRAMUSCULAR | Status: DC | PRN
  Administered 2020-11-13: 60 mg via INTRAVENOUS

## 2020-11-13 MED ORDER — FENTANYL CITRATE (PF) 100 MCG/2ML IJ SOLN: 50.0000 ug | INTRAMUSCULAR | Status: DC | PRN

## 2020-11-13 MED ORDER — IOHEXOL 350 MG/ML SOLN
100.0000 mL | Freq: Once | INTRAVENOUS | Status: AC | PRN
  Administered 2020-11-13: 100 mL via INTRAVENOUS

## 2020-11-13 MED ORDER — LIDOCAINE HCL 1 % IJ SOLN
INTRAMUSCULAR | Status: AC
  Filled 2020-11-13: qty 20

## 2020-11-13 MED ORDER — ACETAMINOPHEN 325 MG PO TABS: 650.0000 mg | ORAL_TABLET | ORAL | Status: DC | PRN

## 2020-11-13 MED ORDER — PANTOPRAZOLE SODIUM 40 MG IV SOLR
40.0000 mg | Freq: Every day | INTRAVENOUS | Status: DC
  Administered 2020-11-13 – 2020-11-15 (×3): 40 mg via INTRAVENOUS
  Filled 2020-11-13 (×3): qty 40

## 2020-11-13 MED ORDER — ACETAMINOPHEN 160 MG/5ML PO SOLN: 650.0000 mg | ORAL | Status: DC | PRN

## 2020-11-13 MED ORDER — FENTANYL CITRATE (PF) 100 MCG/2ML IJ SOLN
INTRAMUSCULAR | Status: AC
  Filled 2020-11-13: qty 2

## 2020-11-13 MED ORDER — CHLORHEXIDINE GLUCONATE 0.12% ORAL RINSE (MEDLINE KIT)
15.0000 mL | Freq: Two times a day (BID) | OROMUCOSAL | Status: DC
  Administered 2020-11-13 – 2020-11-14 (×2): 15 mL via OROMUCOSAL

## 2020-11-13 MED ORDER — NITROGLYCERIN 1 MG/10 ML FOR IR/CATH LAB
INTRA_ARTERIAL | Status: AC
  Filled 2020-11-13: qty 10

## 2020-11-13 MED ORDER — POTASSIUM CHLORIDE 20 MEQ PO PACK
60.0000 meq | PACK | Freq: Once | ORAL | Status: AC
  Administered 2020-11-14: 60 meq
  Filled 2020-11-13: qty 3

## 2020-11-13 MED ORDER — INSULIN ASPART 100 UNIT/ML IJ SOLN
0.0000 [IU] | INTRAMUSCULAR | Status: DC
  Administered 2020-11-14 – 2020-11-16 (×8): 1 [IU] via SUBCUTANEOUS
  Administered 2020-11-16: 2 [IU] via SUBCUTANEOUS
  Administered 2020-11-16 – 2020-11-17 (×4): 1 [IU] via SUBCUTANEOUS
  Administered 2020-11-17: 2 [IU] via SUBCUTANEOUS
  Administered 2020-11-17 (×3): 1 [IU] via SUBCUTANEOUS
  Administered 2020-11-17 – 2020-11-18 (×3): 2 [IU] via SUBCUTANEOUS
  Administered 2020-11-18: 1 [IU] via SUBCUTANEOUS
  Administered 2020-11-18: 2 [IU] via SUBCUTANEOUS
  Administered 2020-11-19 (×4): 1 [IU] via SUBCUTANEOUS
  Administered 2020-11-19: 2 [IU] via SUBCUTANEOUS
  Administered 2020-11-19 – 2020-11-20 (×6): 1 [IU] via SUBCUTANEOUS
  Administered 2020-11-21: 2 [IU] via SUBCUTANEOUS
  Administered 2020-11-21 – 2020-11-26 (×23): 1 [IU] via SUBCUTANEOUS
  Administered 2020-11-26: 2 [IU] via SUBCUTANEOUS
  Administered 2020-11-26 (×2): 1 [IU] via SUBCUTANEOUS
  Administered 2020-11-26: 2 [IU] via SUBCUTANEOUS
  Administered 2020-11-26 – 2020-11-29 (×11): 1 [IU] via SUBCUTANEOUS
  Administered 2020-11-29 (×2): 2 [IU] via SUBCUTANEOUS
  Administered 2020-11-29 – 2020-11-30 (×4): 1 [IU] via SUBCUTANEOUS
  Administered 2020-11-30: 0 [IU] via SUBCUTANEOUS
  Administered 2020-11-30: 1 [IU] via SUBCUTANEOUS
  Administered 2020-11-30 – 2020-12-02 (×9): 2 [IU] via SUBCUTANEOUS
  Administered 2020-12-03 – 2020-12-04 (×11): 1 [IU] via SUBCUTANEOUS
  Administered 2020-12-05: 2 [IU] via SUBCUTANEOUS
  Administered 2020-12-05 – 2020-12-06 (×4): 1 [IU] via SUBCUTANEOUS

## 2020-11-13 MED ORDER — ONDANSETRON HCL 4 MG/2ML IJ SOLN: 4.0000 mg | Freq: Four times a day (QID) | INTRAMUSCULAR | Status: DC | PRN

## 2020-11-13 MED ORDER — REVEFENACIN 175 MCG/3ML IN SOLN
175.0000 ug | Freq: Every day | RESPIRATORY_TRACT | Status: DC
  Filled 2020-11-13: qty 3

## 2020-11-13 MED ORDER — ONDANSETRON HCL 4 MG/2ML IJ SOLN
INTRAMUSCULAR | Status: DC | PRN
  Administered 2020-11-13: 4 mg via INTRAVENOUS

## 2020-11-13 MED ORDER — ARFORMOTEROL TARTRATE 15 MCG/2ML IN NEBU
15.0000 ug | INHALATION_SOLUTION | Freq: Two times a day (BID) | RESPIRATORY_TRACT | Status: DC
  Administered 2020-11-13 – 2020-12-06 (×45): 15 ug via RESPIRATORY_TRACT
  Filled 2020-11-13 (×48): qty 2

## 2020-11-13 MED ORDER — SODIUM CHLORIDE 0.9 % IV SOLN: INTRAVENOUS | Status: DC | PRN

## 2020-11-13 MED ORDER — CLEVIDIPINE BUTYRATE 0.5 MG/ML IV EMUL
0.0000 mg/h | INTRAVENOUS | Status: AC
  Administered 2020-11-13: 1 mg/h via INTRAVENOUS
  Administered 2020-11-13: 10 mg/h via INTRAVENOUS
  Administered 2020-11-13: 8 mg/h via INTRAVENOUS
  Administered 2020-11-14: 16 mg/h via INTRAVENOUS
  Administered 2020-11-14 (×2): 15 mg/h via INTRAVENOUS
  Administered 2020-11-14: 12 mg/h via INTRAVENOUS
  Filled 2020-11-13 (×4): qty 50
  Filled 2020-11-13: qty 100
  Filled 2020-11-13 (×2): qty 50

## 2020-11-13 MED ORDER — PROPOFOL 10 MG/ML IV BOLUS
INTRAVENOUS | Status: DC | PRN
  Administered 2020-11-13: 100 mg via INTRAVENOUS

## 2020-11-13 MED ORDER — SUCCINYLCHOLINE CHLORIDE 200 MG/10ML IV SOSY
PREFILLED_SYRINGE | INTRAVENOUS | Status: DC | PRN
  Administered 2020-11-13: 160 mg via INTRAVENOUS

## 2020-11-13 MED ORDER — ACETAMINOPHEN 160 MG/5ML PO SOLN
650.0000 mg | ORAL | Status: DC | PRN
Start: 2020-11-13 — End: 2020-12-06
  Administered 2020-11-15 – 2020-12-01 (×4): 650 mg
  Filled 2020-11-13 (×4): qty 20.3

## 2020-11-13 MED ORDER — PROPOFOL 500 MG/50ML IV EMUL
INTRAVENOUS | Status: DC | PRN
  Administered 2020-11-13: 100 ug/kg/min via INTRAVENOUS

## 2020-11-13 MED ORDER — ORAL CARE MOUTH RINSE
15.0000 mL | OROMUCOSAL | Status: DC
  Administered 2020-11-13 – 2020-11-14 (×8): 15 mL via OROMUCOSAL

## 2020-11-13 MED ORDER — FENTANYL CITRATE (PF) 250 MCG/5ML IJ SOLN
INTRAMUSCULAR | Status: DC | PRN
  Administered 2020-11-13: 50 ug via INTRAVENOUS

## 2020-11-13 MED ORDER — PHENYLEPHRINE HCL-NACL 10-0.9 MG/250ML-% IV SOLN
INTRAVENOUS | Status: DC | PRN
  Administered 2020-11-13: 25 ug/min via INTRAVENOUS

## 2020-11-13 MED ORDER — ACETAMINOPHEN 325 MG PO TABS
650.0000 mg | ORAL_TABLET | ORAL | Status: DC | PRN
  Filled 2020-11-13: qty 2

## 2020-11-13 MED ORDER — SODIUM CHLORIDE 0.9 % IV SOLN: INTRAVENOUS | Status: DC

## 2020-11-13 MED ORDER — CEFAZOLIN SODIUM-DEXTROSE 2-4 GM/100ML-% IV SOLN
INTRAVENOUS | Status: AC
  Filled 2020-11-13: qty 100

## 2020-11-13 NOTE — Telephone Encounter (Signed)
Spoke with pt's husband who reports pt is currently in [facility] - ICU d/t stroke.  Pt's husband advised will make Dr Graciela Husbands, Dr Anne Fu and Otilio Saber, PA-C aware as requested.

## 2020-11-13 NOTE — Procedures (Addendum)
S/p bilateral common carotid arteriogram Lt CFA approach  S/P complete revascularization of occluded Lt MCA M 1 seg with x 1 pass of contact aspiration and prox flow arrest achieving a TICI 3 revascularization. Post CT brain No ICH. Left intubated  to protect airway and high NIH prior to the treatment. Manual compression for hemostasis at LT CFA puncture site.. Distal pulses all intact.   RT CCA arteriogram  Severe stenosis  with angiographic string sign.  S.Daevion Navarette MD

## 2020-11-13 NOTE — Progress Notes (Signed)
eLink Physician-Brief Progress Note Patient Name: EVELINA LORE DOB: 04/25/1945 MRN: [id number]   Date of Service  11/13/2020  HPI/Events of Note  Bradycardia - HR = 40's to 50's on Propofol IV infusion at 5 mcg/kg/min.   eICU Interventions  Plan: D/C Propofol IV infusion. BMP and Mg++ level now. Continue Fentanyl 50 mcg Q 1 hour PRN agitation, sedation or pain.     Intervention Category Major Interventions: Arrhythmia - evaluation and management  Arvilla Salada Eugene 11/13/2020, 9:45 PM

## 2020-11-13 NOTE — Progress Notes (Deleted)
PCP:  Clayborn Heron, MD Primary Cardiologist: Donato Schultz, MD Electrophysiologist: Sherryl Manges, MD   Beverly Li is a 75 y.o. female seen today for Sherryl Manges, MD for acute visit due to flank hematoma with eliquis held.   Reviewed case with Dr. Graciela Husbands last week who agreed with stopping tikosyn given her failure to maintain rhythm and issues with anticoagulation.   Since discharge from hospital the patient reports doing ***.  she denies chest pain, palpitations, dyspnea, PND, orthopnea, nausea, vomiting, dizziness, syncope, edema, weight gain, or early satiety.  Past Medical History:  Diagnosis Date   Anxiety    Arthritis    "some in my lower back; probably elbows, knees" (11/18/2017)   Atrial fibrillation (HCC)    Bell's palsy    when pt. was 75 yrs old, when under stress the left side of face will droop.   Complication of anesthesia    "vascular OR 2016; BP bottomed out; couldn't get it regulated; ended up in ICU for DAYS" (11/18/2017)   GERD (gastroesophageal reflux disease)    Heart murmur    History of kidney stones    Hypertension    Hypertrophic cardiomyopathy (HCC)    severe LV basilar hypertrophy witn no evidence of significant outflow tract obstruction, EF 65-70%, mild LAE, mild TR, grade 1a diastolic dysfunction 05/15/10 (Dr. Donato Schultz) (Atrial Septal Hypertrophy pattern)-- Intra-op TEE with dsignificant outflow tract obstruction - AI, MR & TR   Insomnia    Mild aortic sclerosis    Osteopenia    Peripheral vascular disease (HCC)    Syncope    , Vagal   Past Surgical History:  Procedure Laterality Date   AUGMENTATION MAMMAPLASTY Bilateral    BACK SURGERY     CARDIAC CATHETERIZATION N/A 05/07/2016   Procedure: Left Heart Cath and Coronary Angiography;  Surgeon: Kathleene Hazel, MD;  Location[facility] INVASIVE CV LAB;  Service: Cardiovascular;  Laterality: N/A;   CARDIOVERSION N/A 09/24/2017   Procedure: CARDIOVERSION;  Surgeon: Laurey Morale, MD;   Location: [facility] ENDOSCOPY;  Service: Cardiovascular;  Laterality: N/A;   DILATION AND CURETTAGE OF UTERUS     ENDARTERECTOMY FEMORAL Right 03/02/2015   Procedure: ENDARTERECTOMY RIGHT FEMORAL;  Surgeon: Chuck Hint, MD;  Location: [facility] OR;  Service: Vascular;  Laterality: Right;   FACIAL COSMETIC SURGERY Left 2002   "related to Bell's Palsy @ age 20; left eye/side of face droopy; tried to make area symmetrical"   FEMORAL-POPLITEAL BYPASS GRAFT Right 03/02/2015   Procedure: BYPASS GRAFT FEMORAL-BELOW KNEE POPLITEAL ARTERY;  Surgeon: Chuck Hint, MD;  Location: [facility] OR;  Service: Vascular;  Laterality: Right;   INGUINAL HERNIA REPAIR Bilateral 2002   OVARIAN CYST REMOVAL Left    PERIPHERAL VASCULAR CATHETERIZATION N/A 01/16/2015   Procedure: Abdominal Aortogram;  Surgeon: Chuck Hint, MD;  Location: [facility] INVASIVE CV LAB;  Service: Cardiovascular;  Laterality: N/A;   POSTERIOR LUMBAR FUSION  2015   "have plates and screws in there"   TONSILLECTOMY      No current facility-administered medications for this visit.   Current Outpatient Medications  Medication Sig Dispense Refill   acetaminophen (TYLENOL) 500 MG tablet Take 1,500 mg by mouth every 6 (six) hours as needed (for pain.).     alendronate (FOSAMAX) 70 MG tablet Take 70 mg by mouth every Monday. Take with a full glass of water on an empty stomach.     azithromycin (ZITHROMAX) 250 MG tablet Take 2 tablets PO on day one,  and one tablet PO daily thereafter until completed. 6 tablet 0   benzonatate (TESSALON) 100 MG capsule Take 1 capsule (100 mg total) by mouth 3 (three) times daily as needed. 30 capsule 0   buPROPion (WELLBUTRIN XL) 150 MG 24 hr tablet Take 150 mg by mouth every morning.      Calcium-Phosphorus-Vitamin D (CALCIUM/D3 ADULT GUMMIES PO) Take 2 tablets by mouth daily at 2 PM.     DEXILANT 30 MG capsule Take 1 capsule by mouth daily.     diltiazem (CARDIZEM) 30 MG tablet Take 1 tablet (30 mg total) by  mouth daily as needed (blood pressure higher than 160). 90 tablet 3   ELIQUIS 5 MG TABS tablet TAKE 1 TABLET(5 MG) BY MOUTH TWICE DAILY 180 tablet 1   ezetimibe (ZETIA) 10 MG tablet TAKE 1 TABLET(10 MG) BY MOUTH DAILY 90 tablet 3   hydrALAZINE (APRESOLINE) 25 MG tablet TAKE 1 TABLET BY MOUTH EVERY 8 HOURS AS NEEDED FOR SYSTOLIC BLOOD PRESSURE GREATER THAN 160 90 tablet 8   midodrine (PROAMATINE) 10 MG tablet TAKE 1 TABLET BY MOUTH THREE TIMES DAILY WITH MEALS AS NEEDED FOR BLOD PRESSURE LOWER THAN 100(SYSTOLIC) 90 tablet 7   Multiple Vitamins-Iron (STRESS B COMPLEX/IRON) TABS Take 1 tablet by mouth at bedtime.      nebivolol (BYSTOLIC) 10 MG tablet Take 1 tablet (10 mg total) by mouth daily. 90 tablet 3   potassium chloride (KLOR-CON) 10 MEQ tablet TAKE 1 TABLET(10 MEQ) BY MOUTH DAILY 30 tablet 6   predniSONE (STERAPRED UNI-PAK 21 TAB) 10 MG (21) TBPK tablet 6 day taper; take as directed on package instructions 21 tablet 0   PROAIR HFA 108 (90 Base) MCG/ACT inhaler Inhale 2 puffs into the lungs 4 (four) times daily as needed (for wheezing/shortness of breath.).   1   TRELEGY ELLIPTA 100-62.5-25 MCG/INH AEPB Take 1 puff by mouth daily.     zolpidem (AMBIEN) 10 MG tablet Take 10 mg by mouth at bedtime as needed.     Facility-Administered Medications Ordered in Other Visits  Medication Dose Route Frequency Provider Last Rate Last Admin   ceFAZolin (ANCEF) 2-4 GM/100ML-% IVPB            fentaNYL (SUBLIMAZE) 100 MCG/2ML injection            lidocaine (XYLOCAINE) 1 % (with pres) injection            nitroGLYCERIN 100 mcg/mL intra-arterial injection             Allergies  Allergen Reactions   Amoxicillin Other (See Comments)    UTI Has patient had a PCN reaction causing immediate rash, facial/tongue/throat swelling, SOB or lightheadedness with hypotension: No Has patient had a PCN reaction causing severe rash involving mucus membranes or skin necrosis: No Has patient had a PCN reaction that  required hospitalization: No Has patient had a PCN reaction occurring within the last 10 years: Yes--UTI ONLY If all of the above answers are "NO", then may proceed with Cephalosporin use.    Atenolol Cough   Crestor [Rosuvastatin Calcium] Other (See Comments)    Muscle aches   Other Other (See Comments)   Pravastatin Other (See Comments)    Muscle aches   Sulfa Antibiotics Nausea Only   Codeine     hallucinations    Social History   Socioeconomic History   Marital status: Married    Spouse name: Not on file   Number of children: Not on file   Years  of education: Not on file   Highest education level: Not on file  Occupational History   Not on file  Tobacco Use   Smoking status: Former    Packs/day: 1.00    Years: 50.00    Pack years: 50.00    Types: Cigarettes    Quit date: 12/15/2014    Years since quitting: 5.9   Smokeless tobacco: Never  Vaping Use   Vaping Use: Never used  Substance and Sexual Activity   Alcohol use: Yes    Comment: 11/18/2017 "might have a couple glasses of wine/month; if that"   Drug use: Never   Sexual activity: Not Currently  Other Topics Concern   Not on file  Social History Narrative   Not on file   Social Determinants of Health   Financial Resource Strain: Not on file  Food Insecurity: Not on file  Transportation Needs: Not on file  Physical Activity: Not on file  Stress: Not on file  Social Connections: Not on file  Intimate Partner Violence: Not on file     Review of Systems: General: No chills, fever, night sweats or weight changes  Cardiovascular:  No chest pain, dyspnea on exertion, edema, orthopnea, palpitations, paroxysmal nocturnal dyspnea Dermatological: No rash, lesions or masses Respiratory: No cough, dyspnea Urologic: No hematuria, dysuria Abdominal: No nausea, vomiting, diarrhea, bright red blood per rectum, melena, or hematemesis Neurologic: No visual changes, weakness, changes in mental status All other systems  reviewed and are otherwise negative except as noted above.  Physical Exam: There were no vitals filed for this visit.  GEN- The patient is well appearing, alert and oriented x 3 today.   HEENT: normocephalic, atraumatic; sclera clear, conjunctiva pink; hearing intact; oropharynx clear; neck supple, no JVP Lymph- no cervical lymphadenopathy Lungs- Clear to ausculation bilaterally, normal work of breathing.  No wheezes, rales, rhonchi Heart- Regular rate and rhythm, no murmurs, rubs or gallops, PMI not laterally displaced GI- soft, non-tender, non-distended, bowel sounds present, no hepatosplenomegaly Extremities- no clubbing, cyanosis, or edema; DP/PT/radial pulses 2+ bilaterally MS- no significant deformity or atrophy Skin- warm and dry, no rash or lesion Psych- euthymic mood, full affect Neuro- strength and sensation are intact  EKG is ordered. Personal review of EKG from today shows ***  Additional studies reviewed include: Previous EP office notes  Assessment and Plan:  1. HCM (?HOCM)     No syncope     No symptoms or exam findings of volume OL Echo is nearly 75 years old. Will udpate.    2. Paroxysmal Afib, flutter CHA2DS2Vasc is 5.  Eliquis currently on hold. Tikosyn stopped with failure and holding Eliquis.    3. HTN *** today.   4. Flank hematoma  Graciella Freer, PA-C  11/13/20 2:25 PM

## 2020-11-13 NOTE — ED Provider Notes (Signed)
[facility] EMERGENCY DEPARTMENT Provider Note   CSN: [id number] Arrival date & time: 11/13/20  1340  An emergency department physician performed an initial assessment on this suspected stroke patient at 1343.  History Chief Complaint  Patient presents with   Code Stroke    Beverly Li is a 75 y.o. female.  HPI  75 year old female with a history of atrial fibrillation on Eliquis presenting as a code stroke.  The patient's Eliquis had been held for the past week for a hematoma.  She presented with right-sided deficits to include weakness of the right hemibody and right-sided facial droop.  Unclear last known normal per family although she was seen lying down at 1000 and at 1100 she was found unresponsive.  On arrival, the patient was protecting her airway, shallow breathing noted but satting well on 2 L O2.  GCS 10 (4-1-5).  In the CT scanner, the patient was found to have an LVO with left M1 MCA cutoff.  tPA was not given and the patient was transferred urgently to the IR suite for thrombectomy.  Past Medical History:  Diagnosis Date   Anxiety    Arthritis    "some in my lower back; probably elbows, knees" (11/18/2017)   Atrial fibrillation (HCC)    Bell's palsy    when pt. was 75 yrs old, when under stress the left side of face will droop.   Complication of anesthesia    "vascular OR 2016; BP bottomed out; couldn't get it regulated; ended up in ICU for DAYS" (11/18/2017)   GERD (gastroesophageal reflux disease)    Heart murmur    History of kidney stones    Hypertension    Hypertrophic cardiomyopathy ([place])    severe LV basilar hypertrophy witn no evidence of significant outflow tract obstruction, EF 65-70%, mild LAE, mild TR, grade 1a diastolic dysfunction 6/75/91 (Dr. 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   Visit for monitoring Tikosyn therapy 11/18/2017   Closed nondisplaced fracture of head of radius with routine healing 03/03/2017   Coronary artery disease involving native coronary artery of native heart without angina pectoris    Chronic diastolic heart failure ([place]) 03/28/2015   Paroxysmal atrial fibrillation (HCC)    Essential hypertension    S/P femoral-popliteal bypass surgery    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[place]) 03/03/2015   PAD (peripheral artery disease) ([place]) 03/02/2015   Claudication ([place]) 01/31/2015   Former smoker 01/31/2015   Hyperlipidemia 01/31/2015   Atherosclerosis of native arteries of extremity with intermittent claudication ([place]) 02/10/2014   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   Atrial arrhythmia    Mild aortic sclerosis     Past Surgical History:  Procedure Laterality Date   AUGMENTATION MAMMAPLASTY Bilateral    BACK SURGERY     CARDIAC CATHETERIZATION N/A 05/07/2016   Procedure: Left Heart Cath and Coronary Angiography;  Surgeon: Burnell Blanks, MD;  Location:  [facility] INVASIVE CV LAB;  Service: Cardiovascular;  Laterality: N/A;   CARDIOVERSION N/A 09/24/2017   Procedure: CARDIOVERSION;  Surgeon: Larey Dresser, MD;  Location: [facility] ENDOSCOPY;  Service: Cardiovascular;  Laterality: N/A;   DILATION AND CURETTAGE OF UTERUS     ENDARTERECTOMY FEMORAL Right 03/02/2015   Procedure: ENDARTERECTOMY RIGHT FEMORAL;  Surgeon: Angelia Mould, MD;  Location: [facility];  Service: Vascular;  Laterality: Right;   FACIAL COSMETIC SURGERY Left  2002   "related to [place] @ age 71; left eye/side of face droopy; tried to make area symmetrical"   FEMORAL-POPLITEAL BYPASS GRAFT Right 03/02/2015   Procedure: BYPASS GRAFT FEMORAL-BELOW KNEE POPLITEAL ARTERY;  Surgeon: Angelia Mould, MD;  Location: [facility];  Service: Vascular;  Laterality: Right;   INGUINAL HERNIA REPAIR Bilateral 2002   OVARIAN CYST REMOVAL Left    PERIPHERAL VASCULAR CATHETERIZATION N/A 01/16/2015   Procedure: Abdominal Aortogram;  Surgeon: Angelia Mould, MD;  Location: [facility] CV LAB;  Service: Cardiovascular;  Laterality: N/A;   POSTERIOR LUMBAR FUSION  2015   "have plates and screws in there"   TONSILLECTOMY       OB History   No obstetric history on file.     Family History  Problem Relation Age of Onset   Liver cancer Mother    Cancer Mother        Liver   Hypertension Mother    Lung cancer Father    Cancer Father        Lung   Breast cancer Sister    Cancer Sister        Breast    Social History   Tobacco Use   Smoking status: Former    Packs/day: 1.00    Years: 50.00    Pack years: 50.00    Types: Cigarettes    Quit date: 12/15/2014    Years since quitting: 5.9   Smokeless tobacco: Never  Vaping Use   Vaping Use: Never used  Substance Use Topics   Alcohol use: Yes    Comment: 11/18/2017 "might have a couple glasses of wine/month; if that"   Drug use: Never    Home Medications Prior to Admission medications   Medication Sig Start Date End Date Taking? Authorizing Provider  acetaminophen (TYLENOL) 500 MG tablet Take 1,500 mg by mouth every 6 (six) hours as needed (for pain.).    [provider]  alendronate (FOSAMAX) 70 MG tablet Take 70 mg by mouth every Monday. Take with a full glass of water on an empty stomach.    [provider]  azithromycin (ZITHROMAX) 250 MG tablet Take 2 tablets PO on day one, and one tablet PO daily thereafter until completed. 10/17/20   Mar Daring, PA-C   benzonatate (TESSALON) 100 MG capsule Take 1 capsule (100 mg total) by mouth 3 (three) times daily as needed. 10/17/20   Mar Daring, PA-C  buPROPion (WELLBUTRIN XL) 150 MG 24 hr tablet Take 150 mg by mouth every morning.     [provider]  Calcium-Phosphorus-Vitamin D (CALCIUM/D3 ADULT GUMMIES PO) Take 2 tablets by mouth daily at 2 PM.    [provider]  DEXILANT 30 MG capsule Take 1 capsule by mouth daily. 12/21/19   [provider]  diltiazem (CARDIZEM) 30 MG tablet Take 1 tablet (30 mg total) by mouth daily as needed (blood pressure higher than 160). 07/02/19   Jerline Pain, MD  ELIQUIS 5 MG TABS tablet TAKE 1 TABLET(5  MG) BY MOUTH TWICE DAILY 10/24/20   Jerline Pain, MD  ezetimibe (ZETIA) 10 MG tablet TAKE 1 TABLET(10 MG) BY MOUTH DAILY 03/02/20   Jerline Pain, MD  hydrALAZINE (APRESOLINE) 25 MG tablet TAKE 1 TABLET BY MOUTH EVERY 8 HOURS AS NEEDED FOR SYSTOLIC BLOOD PRESSURE GREATER THAN 160 07/02/19   Deboraha Sprang, MD  midodrine (PROAMATINE) 10 MG tablet TAKE 1 TABLET BY MOUTH THREE TIMES DAILY WITH MEALS AS NEEDED FOR BLOD PRESSURE LOWER THAN 100(SYSTOLIC) 0/86/76   Jerline Pain, MD  Multiple Vitamins-Iron (STRESS B COMPLEX/IRON) TABS Take 1 tablet by mouth at bedtime.     [provider]  nebivolol (BYSTOLIC) 10 MG tablet Take 1 tablet (10 mg total) by mouth daily. 09/12/20   Jerline Pain, MD  potassium chloride (KLOR-CON) 10 MEQ tablet TAKE 1 TABLET(10 MEQ) BY MOUTH DAILY 05/12/20   Deboraha Sprang, MD  predniSONE (STERAPRED UNI-PAK 21 TAB) 10 MG (21) TBPK tablet 6 day taper; take as directed on package instructions 10/17/20   [place]  [facility] HFA 108 (90 Base) MCG/ACT inhaler Inhale 2 puffs into the lungs 4 (four) times daily as needed (for wheezing/shortness of breath.).  03/03/17   [provider]  TRELEGY ELLIPTA 100-62.5-25 MCG/INH AEPB Take 1 puff by mouth daily. 08/10/20   [provider]  zolpidem  (AMBIEN) 10 MG tablet Take 10 mg by mouth at bedtime as needed. 03/10/19   [provider]    Allergies    Amoxicillin, Atenolol, Crestor [rosuvastatin calcium], Other, Pravastatin, Sulfa antibiotics, and Codeine  Review of Systems   Review of Systems  Unable to perform ROS: Acuity of condition   Physical Exam Updated Vital Signs BP (!) 105/58   Pulse (!) 49   Temp (!) 97.5 F (36.4 C) (Oral)   Resp 20   Ht $R'5\' 3"'Mo$  (1.6 m)   Wt 51.4 kg   LMP  (LMP Unknown)   SpO2 96%   BMI 20.07 kg/m   Physical Exam Vitals and nursing note reviewed.  Constitutional:      General: She is in acute distress.     Appearance: She is well-developed.     Comments: GCS 10 (4-1-5), shallow breathing noted but protecting airway  HENT:     Head: Normocephalic and atraumatic.  Eyes:     Conjunctiva/sclera: Conjunctivae normal.  Cardiovascular:     Rate and Rhythm: Normal rate and regular rhythm.  Pulmonary:     Effort: No respiratory distress.     Breath sounds: Normal breath sounds.  Abdominal:     Palpations: Abdomen is soft.     Tenderness: There is no abdominal tenderness.  Musculoskeletal:     Cervical back: Neck supple.  Skin:    General: Skin is warm and dry.  Neurological:     Mental Status: She is alert.     Comments: Right-sided facial droop, no blink to threat, right hemibody deficits, moving left hemibody spontaneously and response to pain.    ED Results / Procedures / Treatments   Labs (all labs ordered are listed, but only abnormal results are displayed) Labs Reviewed  CBC - Abnormal; Notable for the following components:      Result Value   RDW 18.3 (*)    All other components within normal limits  COMPREHENSIVE METABOLIC PANEL - Abnormal; Notable for the following components:   BUN 24 (*)    Calcium 8.7 (*)    Total Protein 5.7 (*)  Albumin 3.1 (*)    AST 48 (*)    Total Bilirubin 1.7 (*)    All other components within normal limits  URINALYSIS, ROUTINE  W REFLEX MICROSCOPIC - Abnormal; Notable for the following components:   Specific Gravity, Urine 1.045 (*)    Hgb urine dipstick SMALL (*)    Ketones, ur 5 (*)    All other components within normal limits  HEMOGLOBIN A1C - Abnormal; Notable for the following components:   Hgb A1c MFr Bld 6.0 (*)    All other components within normal limits  GLUCOSE, CAPILLARY - Abnormal; Notable for the following components:   Glucose-Capillary 111 (*)    All other components within normal limits  I-STAT CHEM 8, ED - Abnormal; Notable for the following components:   BUN 28 (*)    Calcium, Ion 0.97 (*)    All other components within normal limits  POCT I-STAT 7, (LYTES, BLD GAS, ICA,H+H) - Abnormal; Notable for the following components:   pO2, Arterial 59 (*)    Potassium 3.3 (*)    All other components within normal limits  RESP PANEL BY RT-PCR (FLU A&B, COVID) ARPGX2  MRSA NEXT GEN BY PCR, NASAL  ETHANOL  PROTIME-INR  APTT  DIFFERENTIAL  RAPID URINE DRUG SCREEN, HOSP PERFORMED  BLOOD GAS, ARTERIAL  CBC  PROTIME-INR  BASIC METABOLIC PANEL  CBC WITH DIFFERENTIAL/PLATELET  LIPID PANEL  CBG MONITORING, ED    EKG None  Radiology DG Chest 1 View  Result Date: 11/13/2020 CLINICAL DATA:  Intubation EXAM: CHEST  1 VIEW COMPARISON:  04/27/2020 x-ray, MRI 09/01/2018 FINDINGS: Endotracheal tube terminates approximately 4.8 cm above the carina. Mild cardiomegaly. Atherosclerotic calcification of the aortic knob. Diffusely increased interstitial markings within the lung bases. Area of lucency at the left costophrenic angle likely reflecting a skin fold. A small pneumothorax would be difficult to exclude. Osseous structures are demineralized. Chronic vertebral body height loss at approximately T7 compatible with site of prior discitis-osteomyelitis. IMPRESSION: 1. Endotracheal tube terminates approximately 4.8 cm above the carina. 2. Diffusely increased interstitial markings within the lung bases may  reflect edema versus infection. 3. Area of relative lucency at the left costophrenic angle likely reflecting a skin fold. A small pneumothorax would be difficult to exclude. Recommend repeat chest x-ray after patient positioning, preferably upright. Electronically Signed   By: Davina Poke D.O.   On: 11/13/2020 16:54   DG CHEST PORT 1 VIEW  Result Date: 11/13/2020 CLINICAL DATA:  Intubated, enteric catheter placement, CVA EXAM: PORTABLE CHEST 1 VIEW COMPARISON:  The 122 at 4:42 p.m. FINDINGS: 2 supine frontal views of the chest are obtained. Endotracheal tube overlies tracheal air column, tip just below thoracic inlet. Enteric catheter passes below diaphragm tip excluded by collimation. Cardiac silhouette is enlarged but stable. Chronic central vascular congestion without airspace disease, effusion, or pneumothorax. Prominent skin fold overlies the right apex. IMPRESSION: 1. Support devices as above. 2. Chronic vascular congestion.  No acute airspace disease. Electronically Signed   By: Randa Ngo M.D.   On: 11/13/2020 18:34   DG Abd Portable 1V  Result Date: 11/13/2020 CLINICAL DATA:  Enteric catheter placement EXAM: PORTABLE ABDOMEN - 1 VIEW COMPARISON:  03/08/2015 FINDINGS: Frontal view of the abdomen and pelvis was obtained, excluding the lower pelvis by collimation. Enteric catheter passes below diaphragm, tip projecting over region of the gastric antrum. Bowel gas pattern is unremarkable. Excreted contrast within the kidneys from preceding interventional procedure. IMPRESSION: 1. Enteric catheter tip projecting over the  gastric antrum. Electronically Signed   By: Randa Ngo M.D.   On: 11/13/2020 18:32   CT HEAD CODE STROKE WO CONTRAST  Result Date: 11/13/2020 CLINICAL DATA:  Code stroke. EXAM: CT HEAD WITHOUT CONTRAST TECHNIQUE: Contiguous axial images were obtained from the base of the skull through the vertex without intravenous contrast. COMPARISON:  None. FINDINGS: Brain: Asymmetric  hypodensity of the left insula, concerning for acute left MCA territory infarct. No acute hemorrhage, hydrocephalus, extra-axial collection or mass lesion/mass effect. Remote infarcts versus dilated perivascular spaces in bilateral inferior basal ganglia. Mild additional patchy white matter hypoattenuation, most likely related to chronic microvascular ischemic disease. Suspected remote lacunar infarct in the right cerebellum. Vascular: Hyperdense appearance of the distal left M1 MCA. Skull: No acute fracture. Sinuses/Orbits: Clear sinuses.  Unremarkable orbits. Other: No mastoid effusions. ASPECTS [facility] Stroke Program Early CT Score) - Ganglionic level infarction (caudate, lentiform nuclei, internal capsule, insula, M1-M3 cortex): 6 - Supraganglionic infarction (M4-M6 cortex): 3 Total score (0-10 with 10 being normal): 9 IMPRESSION: 1. Mild asymmetric hypodensity of the left insula, concerning for acute left MCA territory infarct. An MRI could further evaluate. 2. Hyperdense appearance of the distal left M1 MCA, concerning for thrombus. Recommend CTA to further evaluate. 3. No acute hemorrhage. 4. Remote lacunar infarcts versus dilated perivascular spaces in bilateral inferior basal ganglia. Suspected remote lacunar infarct in the right cerebellum. Finding discussed with Dr. Lorrin Goodell via telephone at 1:55 p.m. Electronically Signed   By: Margaretha Sheffield MD   On: 11/13/2020 14:03   CT ANGIO HEAD NECK W WO CM W PERF (CODE STROKE)  Result Date: 11/13/2020 EXAM: CT ANGIOGRAPHY HEAD AND NECK CT PERFUSION BRAIN TECHNIQUE: Multidetector CT imaging of the head and neck was performed using the standard protocol during bolus administration of intravenous contrast. Multiplanar CT image reconstructions and MIPs were obtained to evaluate the vascular anatomy. Carotid stenosis measurements (when applicable) are obtained utilizing NASCET criteria, using the distal internal carotid diameter as the denominator. Multiphase  CT imaging of the brain was performed following IV bolus contrast injection. Subsequent parametric perfusion maps were calculated using RAPID software. CONTRAST:  189mL OMNIPAQUE IOHEXOL 350 MG/ML SOLN COMPARISON:  None. FINDINGS: CTA HEAD FINDINGS Anterior circulation: Left ICA is patent with mild to moderate calcific atherosclerosis. Right ICA is non-opacified to the carotid terminus. The right MCA and right posterior communicating artery are opacified, likely the of the right A1 ACA. There is abrupt occlusion of the left M1 MCA with very little opacification of distal MCA branches. Left A1 and A2 ACAs are patent. Loss of opacification of the distal left ACA (A3/A4) on series 7, images 299 through 302, concerning for high-grade stenosis or occlusion. Posterior circulation: Moderate stenosis of the proximal intradural left vertebral artery due to predominately calcific atherosclerosis. Non dominant right intradural vertebral artery. The basilar artery bilateral posterior cerebral arteries are patent without evidence of proximal flow limiting stenosis. Prominent right posterior communicating artery with hypoplastic or absent right A1 ACA, anatomic variant. Venous sinuses: As permitted by contrast timing, patent. Anatomic variants: As detailed above. CTA NECK FINDINGS Aortic arch: Atherosclerosis.  Great vessel origins are patent. Right carotid system: Patent common carotid artery. There is mixed calcific and noncalcific atherosclerosis at the carotid bifurcation with occlusion of the proximal internal carotid artery. The remainder of the ICA is non-opacified. Left carotid system: Patent common carotid artery. Calcific and noncalcific atherosclerosis at the carotid bifurcation with approximately 40-50% stenosis Vertebral arteries: Left dominant. Severe stenosis of the non  dominant right vertebral artery origins. Skeleton: Severe degenerative disease at C5-C6. Other neck: No acute findings.  Severe emphysema. Upper  chest: Severe emphysema. Review of the MIP images confirms the above findings CT Brain Perfusion Findings: ASPECTS: 9 CBF (<30%) Volume: 15m Perfusion (Tmax>6.0s) volume: 1656mMismatch Volume: 12242mnfarction Location:Anterior left MCA territory IMPRESSION: 1. Occlusion of the left M1 MCA with very little opacification of distal left MCA branches. Perfusion demonstrates the core infarct of 41 mL in the anterior left MCA territory with large (122 ml) of ischemic phenomena in the remaining left MCA and potentially left ACA territories. 2. Age indeterminate occlusion of the proximal right internal carotid artery in the neck with non-opacification through the carotid terminus. The right MCA and right posterior communicating artery are opacified via the of the right A1 ACA. 3. Suspected high-grade stenosis versus occlusion of the distal left A3/A4 ACA. 4. Approximately 40-50% stenosis of the proximal left ICA in the neck. 5. Severe stenosis of the non dominant/small right vertebral artery origin. Moderate stenosis of the intradural left vertebral artery. 6. Severe emphysema. Critical findings discussed with Dr. KhaLorrin Goodell 1:57 p.m. Electronically Signed   By: FreMargaretha Sheffield   On: 11/13/2020 14:30    Procedures .Critical Care  Date/Time: 11/13/2020 2:16 PM Performed by: LawRegan LemmingD Authorized by: LawRegan LemmingD   Critical care provider statement:    Critical care time (minutes):  33   Critical care was time spent personally by me on the following activities:  Discussions with consultants, examination of patient, ordering and review of laboratory studies, ordering and review of radiographic studies, pulse oximetry, re-evaluation of patient's condition, obtaining history from patient or surrogate and review of old charts   Medications Ordered in ED Medications  nitroGLYCERIN 100 mcg/mL intra-arterial injection (has no administration in time range)  lidocaine (XYLOCAINE) 1 % (with pres)  injection (has no administration in time range)  ceFAZolin (ANCEF) 2-4 GM/100ML-% IVPB (has no administration in time range)  acetaminophen (TYLENOL) tablet 650 mg (has no administration in time range)    Or  acetaminophen (TYLENOL) 160 MG/5ML solution 650 mg (has no administration in time range)    Or  acetaminophen (TYLENOL) suppository 650 mg (has no administration in time range)  ondansetron (ZOFRAN) injection 4 mg (has no administration in time range)  0.9 %  sodium chloride infusion ( Intravenous Infusion Verify 11/13/20 1900)  clevidipine (CLEVIPREX) infusion 0.5 mg/mL (10 mg/hr Intravenous Infusion Verify 11/13/20 1900)  clevidipine (CLEVIPREX) 0.5 MG/ML infusion (has no administration in time range)  enoxaparin (LOVENOX) injection 40 mg (has no administration in time range)  fentaNYL (SUBLIMAZE) injection 50 mcg (has no administration in time range)  insulin aspart (novoLOG) injection 0-9 Units (0 Units Subcutaneous Not Given 11/13/20 2031)  pantoprazole (PROTONIX) injection 40 mg (has no administration in time range)  arformoterol (BROVANA) nebulizer solution 15 mcg (15 mcg Nebulization Given 11/13/20 2039)  chlorhexidine gluconate (MEDLINE KIT) (PERIDEX) 0.12 % solution 15 mL (15 mLs Mouth Rinse Given 11/13/20 1953)  MEDLINE mouth rinse (15 mLs Mouth Rinse Given 11/13/20 1830)  Chlorhexidine Gluconate Cloth 2 % PADS 6 each (6 each Topical Given 11/13/20 1725)  revefenacin (YUPELRI) nebulizer solution 175 mcg (has no administration in time range)  iohexol (OMNIPAQUE) 350 MG/ML injection 100 mL (100 mLs Intravenous Contrast Given 11/13/20 1404)  fentaNYL (SUBLIMAZE) 100 MCG/2ML injection (  Override pull for Anesthesia 11/13/20 1429)  iohexol (OMNIPAQUE) 300 MG/ML solution 150 mL (60 mLs Per Tube Contrast Given  11/13/20 1546)    ED Course  I have reviewed the triage vital signs and the nursing notes.  Pertinent labs & imaging results that were available during my care of the patient were reviewed by  me and considered in my medical decision making (see chart for details).    MDM Rules/Calculators/A&P                           75 year old female with a history of atrial fibrillation on Eliquis presenting as a code stroke.  The patient's Eliquis had been held for the past week for a hematoma.  She presented with right-sided deficits to include weakness of the right hemibody and right-sided facial droop.  Unclear last known normal per family although she was seen lying down at 1000 and at 1100 she was found unresponsive.  On arrival, the patient was protecting her airway, shallow breathing noted but satting well on 2 L O2.  GCS 10 (4-1-5).  In the CT scanner, the patient was found to have an LVO with left M1 MCA cutoff.  tPA was not given and the patient was transferred urgently to the IR suite for thrombectomy.   Final Clinical Impression(s) / ED Diagnoses Final diagnoses:  History of ETT  Encounter for imaging study to confirm orogastric (OG) tube placement  Encounter for intubation    Rx / DC Orders ED Discharge Orders     None        Regan Lemming, MD 11/13/20 2118

## 2020-11-13 NOTE — Sedation Documentation (Signed)
Pressure released at 1525, dressing of quick clot, guaze, and tegederm applied to site. No bleeding at site, no bruising related to the puncture. Pulses palpable.

## 2020-11-13 NOTE — Anesthesia Procedure Notes (Signed)
Procedure Name: Intubation Date/Time: 11/13/2020 2:18 PM Performed by: Thelma Comp, CRNA Pre-anesthesia Checklist: Patient identified, Emergency Drugs available, Suction available and Patient being monitored Patient Re-evaluated:Patient Re-evaluated prior to induction Oxygen Delivery Method: Circle System Utilized Preoxygenation: Pre-oxygenation with 100% oxygen Induction Type: IV induction Ventilation: Mask ventilation without difficulty Laryngoscope Size: Mac and 3 Grade View: Grade II Tube type: Oral Tube size: 7.0 mm Number of attempts: 1 Airway Equipment and Method: Stylet Placement Confirmation: ETT inserted through vocal cords under direct vision, positive ETCO2 and breath sounds checked- equal and bilateral Secured at: 21 cm Tube secured with: Tape Dental Injury: Teeth and Oropharynx as per pre-operative assessment

## 2020-11-13 NOTE — Progress Notes (Signed)
Patient transported from PACU to 4N32 without complication.

## 2020-11-13 NOTE — H&P (Signed)
Neurology Admission H&P    Chief Complaint: Code Stroke.   HPI: [name] is a 75 yo female with a PMHx of AFib on Eliquis (off x 1 week), COPD, remote tobacco abuse, hypertrophic cardiomyopathy, aortic stenosis, PAD, anxiety, and GERD. Recent hospitalization for hematoma secondary to coughing, so Elqiuis has been held. Husband reported that patient has been off Eliquis x 5 days per her MD for a hematoma. Her Tikosyn has been held as well by cardiology. Patient presented by EMS today for right sided weakness, right facial droop, and left gaze preference. Per husband, she went to bed at 2300 hrs 11/12/20. Husband saw patient lying down at 1000 hours and noted she was partially asleep but did not wake her. At 1100 hours, husband returned to check on her and found her unresponsive. 911 was called. Patient was placed on 2L O2 per Elmer en route due to O2 sat 89% on RA.  After brief exam at ED bridge, patient taken emergently to CT suite.  Her respirations were shallow, but EDP felt we could get CTH without difficulty. CTH without hemorrhage, but with mild asymmetric hypodensity of the left insula, concerning for acute left MCA territory infarct. Concern for thrombus of distal left M1 MCA. CTA head and neck showed abrupt occlusion of the left M1 MCA with very little opacification of distal MCA branches. CTP with core infarct of 41 ml and penumbra of 163 ml with mismatch volume of 122 ml.   tPA was not given due to out of the window as LKW is unclear, but thought to be 2300 hours 11/12/20. tPA also not given due to hematoma. Code IR was called quickly and patient was taken to IR suite. Patient's respiratory status was worsening after CT and patient was bagged by ED MD on the way to IR. Once in IR, anesthesia intubated patient. Husband consented to IR per Dr. Ezzie DuralSal and Dr. Corliss Skainseveshwar.   LKW: 2300 hours 11/12/20.  tPA: No, outside window. IR: yes.  MRS: 0  Past Medical History:  Diagnosis Date   Anxiety     Arthritis    "some in my lower back; probably elbows, knees" (11/18/2017)   Atrial fibrillation (HCC)    Bell's palsy    when pt. was 75 yrs old, when under stress the left side of face will droop.   Complication of anesthesia    "vascular OR 2016; BP bottomed out; couldn't get it regulated; ended up in ICU for DAYS" (11/18/2017)   GERD (gastroesophageal reflux disease)    Heart murmur    History of kidney stones    Hypertension    Hypertrophic cardiomyopathy (HCC)    severe LV basilar hypertrophy witn no evidence of significant outflow tract obstruction, EF 65-70%, mild LAE, mild TR, grade 1a diastolic dysfunction 05/15/10 (Dr. Donato SchultzMark Skains) (Atrial Septal Hypertrophy pattern)-- Intra-op TEE with dsignificant outflow tract obstruction - AI, MR & TR   Insomnia    Mild aortic sclerosis    Osteopenia    Peripheral vascular disease (HCC)    Syncope    , Vagal    Past Surgical History:  Procedure Laterality Date   AUGMENTATION MAMMAPLASTY Bilateral    BACK SURGERY     CARDIAC CATHETERIZATION N/A 05/07/2016   Procedure: Left Heart Cath and Coronary Angiography;  Surgeon: Kathleene Hazelhristopher D McAlhany, MD;  [facility];  Service: Cardiovascular;  Laterality: N/A;   CARDIOVERSION N/A 09/24/2017   Procedure: CARDIOVERSION;  Surgeon: Laurey MoraleMcLean, Dalton S, MD;  Location:  [facility] ENDOSCOPY;  Service: Cardiovascular;  Laterality: N/A;   DILATION AND CURETTAGE OF UTERUS     ENDARTERECTOMY FEMORAL Right 03/02/2015   Procedure: ENDARTERECTOMY RIGHT FEMORAL;  Surgeon: Chuck Hint, MD;  Location: [facility] OR;  Service: Vascular;  Laterality: Right;   FACIAL COSMETIC SURGERY Left 2002   "related to Bell's Palsy @ age 89; left eye/side of face droopy; tried to make area symmetrical"   FEMORAL-POPLITEAL BYPASS GRAFT Right 03/02/2015   Procedure: BYPASS GRAFT FEMORAL-BELOW KNEE POPLITEAL ARTERY;  Surgeon: Chuck Hint, MD;  Location: [facility] OR;  Service: Vascular;  Laterality: Right;   INGUINAL  HERNIA REPAIR Bilateral 2002   OVARIAN CYST REMOVAL Left    PERIPHERAL VASCULAR CATHETERIZATION N/A 01/16/2015   Procedure: Abdominal Aortogram;  Surgeon: Chuck Hint, MD;  Location: [facility] INVASIVE CV LAB;  Service: Cardiovascular;  Laterality: N/A;   POSTERIOR LUMBAR FUSION  2015   "have plates and screws in there"   TONSILLECTOMY      Family History  Problem Relation Age of Onset   Liver cancer Mother    Cancer Mother        Liver   Hypertension Mother    Lung cancer Father    Cancer Father        Lung   Breast cancer Sister    Cancer Sister        Breast   Social History:  reports that she quit smoking about 5 years ago. Her smoking use included cigarettes. She has a 50.00 pack-year smoking history. She has never used smokeless tobacco. She reports current alcohol use. She reports that she does not use drugs.  Allergies:  Allergies  Allergen Reactions   Amoxicillin Other (See Comments)    UTI Has patient had a PCN reaction causing immediate rash, facial/tongue/throat swelling, SOB or lightheadedness with hypotension: No Has patient had a PCN reaction causing severe rash involving mucus membranes or skin necrosis: No Has patient had a PCN reaction that required hospitalization: No Has patient had a PCN reaction occurring within the last 10 years: Yes--UTI ONLY If all of the above answers are "NO", then may proceed with Cephalosporin use.    Atenolol Cough   Crestor [Rosuvastatin Calcium] Other (See Comments)    Muscle aches   Other Other (See Comments)   Pravastatin Other (See Comments)    Muscle aches   Sulfa Antibiotics Nausea Only   Codeine     hallucinations    PTA medications: Fosamax, Wellbutrin, Ca + D, Dexilant, Cardizem, Zetia, Hydralazine, Midodrine, Bystolic, Proair, Trelegy, Ambien.  Tikosyn and Eliquis on hold for now per cardiology.   ROS: Unable to attain due to emergent nature of event and/or mental status.   Physical Examination: General:  Acutely ill appearing female.  Psych: Calm.  HEENT-  Normocephalic, AT. No lymphadenopathy. No thyromegaly. No stiffness of neck.  Cardiovascular - RRR. No LE edema.  Lungs - Shallow respirations.  Abdomen - soft, NT.  Extremities - warm, well perfused. Skin: WDI. Purplish/red area to all 4 quadrants of abdomen with darker colored purple area to bilateral groin.   NIHSS:  1a Level of Consciousness: 0 1b LOC Questions: 2 1c LOC Commands: 2 2 Best Gaze: 2 3 Visual: 2 4 Facial Palsy: 2 5a Motor Arm - Left: 0 5b Motor Arm - Right: 3 6a Motor Leg - Left: 3 6b Motor Leg - Right: 3 7 Limb Ataxia: 0 8 Sensory: 1 9 Best Language: 2 10 Dysarthria: 1 11 Extinct and  Inattention: 1 TOTAL: 26  Neuro exam:   Mental Status: Awake. Does not follow commands. Not oriented to person, place, month, day, date. Speech/Language: speech is mute. No response to questions. Aphasia noted. Right sided neglect noted. Left gaze preference.   Cranial Nerves:  PERRL. Does not blink to threat. Right sided facial droop noted at rest and with breathing. Does not participate in strength exam. Tone is normal. Bulk is normal. Plantars are down going. Withdraws slightly to noxious stimuli, left over right. Can hold LLE with bent knee position. Unable to lift RLE or RUE. Can hold LUE in the air.   Labs: INR 1, aPTT 27, Glucose 89.   Imaging: Independently reviewed by MD.   CTH:  -Mild asymmetric hypodensity of the left insula, concerning for acute left MCA territory infarct. An MRI could further evaluate. -Hyperdense appearance of the distal left M1 MCA, concerning for thrombus. Recommend CTA to further evaluate. -No acute hemorrhage. -Remote lacunar infarcts versus dilated perivascular spaces in bilateral inferior basal ganglia. Suspected remote lacunar infarct in the right cerebellum.  CTA head/neck and CTP:  -Occlusion of the left M1 MCA with very little opacification of distal left MCA branches.  Perfusion demonstrates the core infarct of 41 mL in the anterior left MCA territory with large (122 ml) of ischemic phenomena in the remaining left MCA and potentially left ACA territories. -Age indeterminate occlusion of the proximal right internal carotid artery in the neck with non-opacification through the carotid terminus. The right MCA and right posterior communicating artery are opacified via the of the right A1 ACA. -Suspected high-grade stenosis versus occlusion of the distal left A3/A4 ACA. -Approximately 40-50% stenosis of the proximal left ICA in the neck. -Severe stenosis of the non dominant/small right vertebral artery origin. Moderate stenosis of the intradural left vertebral artery. -Severe emphysema.  Assessment: 75 yo female with stroke risk factors of prior stroke, HTN, HLD, and tobacco abuse who presented as a code stroke. CTA showed occlusion of the left M1 MCA and patient was taken emergently to IR suite for angiogram/thrombectomy. The stroke would explain her right sided weakness, aphasia, and left gaze preference.   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   Plan: -admit by neurology.  -PCCM consult to manage ETT and ventilator if unable to extubate after IR procedure.  -CTH stat prn for any decline in neurological status.   -Blood pressure goal per Neuro IR for the first 24 hours after intervention. -MRI brain w/o contrast when able - Antiplatelet per Neuro IR after thrombectomy. -HgbA1c, fasting lipid panel, TSH.  -DVT Prophylaxis with Lovenox and SCDs. -Echocardiogram.  -Frequent neuro checks per protocol.  -NIHSS per protocol.  - Risk factor modification. -Telemetry monitoring for arrhythmia.  -PT consult, OT consult, Speech consult. -infectious workup-CBC diff, UA and culture, LA, CXR, CK.  -IR orders if angiogram or thrombectomy.   -stroke education.  -tobacco cessation.  - Stroke team to follow.   Patient seen by Jimmye Norman, NP/Neurology and MD. Note and plan to be edited by MD as necessary.    NEUROHOSPITALIST ADDENDUM Performed a face to face diagnostic evaluation.   I have reviewed the contents of history and physical exam as documented by PA/ARNP/Resident and agree with above documentation.  I have discussed and formulated the above plan as documented. Edits to the note have been made as needed.  Impression/Key exam findings/Plan: 56F hx of Afibb with Eliquis held for 5 days for noted abdominal bruising 2/2 cough  who presents with R sided weakness and L gaze deviation. Found to have a L MCA M1 occlusion. LKW is somewhat unclear. Husband last saw her at 2300 on 7/31/2 when she went to bed. This AM, was partially asleep at 1000 so he did not bother her. Found her unresponsive at 1100. Outside tPA window. 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 Taken to thrombectomy with TICI 3 revasc. Has had labile blood pressure and goal BP per my discussion with Dr. Corliss Skains of 120-140.  This patient is critically ill and at significant risk of neurological worsening, death and care requires constant monitoring of vital signs, hemodynamics,respiratory and cardiac monitoring, neurological assessment, discussion with family, other specialists and medical decision making of high complexity. I spent 90 minutes of neurocritical care time  in the care of  this patient. This was time spent independent of any time provided by nurse practitioner or PA.  Erick Blinks [facility] Pager Number [phone] 11/13/2020  4:14 PM   Erick Blinks, MD [facility] [phone]   If 7pm to 7am, please call on call as listed on AMION.

## 2020-11-13 NOTE — Telephone Encounter (Signed)
Pt's husband is calling to  make heartcare aware that Beverly Li Is currently in the hospital in ICU

## 2020-11-13 NOTE — Progress Notes (Addendum)
1700:  Patient arrived to 4NICU from PACU.  Monitor for vitals attached and cleviprex titrated to goal.  Patient's GCS is currently a 3 and is not following commands or withdrawing to pain.  Pupils sluggish.  No cough or gag present during assessment.  Sedation titrated and turned off.  Neurology came to bedside to assess patient and notified of currently neuro exam.  RN to continue to monitor.  1800:  Neurologist updated family at bedside.

## 2020-11-13 NOTE — Code Documentation (Signed)
Stroke Response Nurse Documentation Code Documentation  Beverly Li is a 75 y.o. female arriving to [facility] ED via [facility] on 11/13/2020 with past medical hx of Afib, Beverly Li's Palsy, HTN, Cardiomyopathy. Code stroke was activated by EMS. Patient from home where she was LKW at Last night at 2300 when she went to bed. This morning, husband saw her at 1000 and reported she was awake, but did not do anything specific. At 1100, he noted that she was unresponsive and note following commands. Pt stopped taking her Eliquis on 7/27 after she noted to have a large hematoma come across her abdomen and peritoneal area. Upon arrival- Severe bruising noted under left breast extending to left hip and complete peritoneum area.   Stroke team at the bedside on patient arrival. Labs drawn and patient cleared for CT by Dr. Criss Alvine. Patient to CT with team. NIHSS 26, see documentation for details and code stroke times. Patient with disoriented, not following commands, left gaze preference , right hemianopia, right facial droop, right arm weakness, bilateral leg weakness, right decreased sensation, Global aphasia , dysarthria , and Sensory  neglect on exam. The following imaging was completed: CT, CTA head and neck, CTP. Patient is not a candidate for tPA due to being outside window.   CTA showed Left MCA M1 occlusion noted per MD Khaliqdina and Code IR Activated. Transported to IR with EDP at the bedside due to low oxygen saturation and decreased LOC. Placed in IR [place] and airway monitored by CRNA.   Care/Plan: Admit to ICU.    Bedside handoff with Bettie Jo,RN.    Lucila Maine  Stroke Response RN

## 2020-11-13 NOTE — Progress Notes (Signed)
Patient ID: Beverly Li, female   DOB: [birth year], 75 y.o.   MRN: [id number] INR. 75 Y RT H FMRS 0 LSW 11 pm last night.  New onset of Rt sided weakness ,lt gaze andaphasic.  CT brasin No ICH ASPECTS 9 CTA occluded Lt MCA M1 seg and "remote" RT ICA occlusion. CTP core of 53ml versus mismatch of  with a ratio of 4.0 Endovascular treatment D/W spouse in a 3 way call. Procedure,reasons and alternatives reviewed. Risks of ICHof 10 % ,worsening neuro deficits ,death and inability to revascularize reviewed. Spouse expressed understanding and gave consent to proceed with the treatment. S.Kamin Niblack MD

## 2020-11-13 NOTE — Consult Note (Signed)
NAME:  Beverly Li, MRN:  [id number], DOB:  1945-05-14, LOS: 0 ADMISSION DATE:  11/13/2020, CONSULTATION DATE:  11/13/20 REFERRING MD:  Derry Lory, CHIEF COMPLAINT:  AMS   History of Present Illness:  75 year old woman with hx of afib on AC (held for past week), COPD, aortic stenosis, PAD, anxiety, GERD presenting with right weakness, right facial droop, left gaze preference.  AC held for following:  "Spoke with pt. She was recently hospitalized with a large hematoma secondary to coughing and eliqus. She was admitted to [facility] in [place]. [place]. While hospitalized, went into AF RVR and was kept for a couple of days to manage. No medications were changed at discharge. She is feeling okay today. HR is <110 and she remains in AF. She was advised to follow up with cardiology for AF management and when to restart anticoagulation."  CT head showing signs of L MCA occlusion, intubated for AMS and airway protection.  Taken to [facility] for thrombectomy with TICI 3 flow after 1 pass to Lt MCA M1 seg.  Brought back to PACU on vent, cleviprex and propofol.  Goal SBP 120-140.   Pertinent  Medical History  Aortic stenosis Hypertrophic cardiomyopathy Afib on tikosyn, [facility] Recent hospitalization for ?hematoma unclear origin PVD COPD no PFTs on file  Significant Hospital Events: Including procedures, antibiotic start and stop dates in addition to other pertinent events   8/1 admitted, intubated, thrombectomy  Interim History / Subjective:  Consulted, currently on 43mcg/kg/min of propofol.  Objective   Blood pressure (!) 96/55, pulse (!) 48, temperature 97.7 F (36.5 C), resp. rate 14, height 5\' 3"  (1.6 m), weight 51.4 kg, SpO2 96 %.    Vent Mode: PRVC FiO2 (%):  [40 %] 40 % Set Rate:  [14 bmp] 14 bmp Vt Set:  [410 mL] 410 mL PEEP:  [5 cmH20] 5 cmH20 Plateau Pressure:  [17 cmH20] 17 cmH20   Intake/Output Summary (Last 24 hours) at 11/13/2020 1624 Last data filed at 11/13/2020  1543 Gross per 24 hour  Intake 700 ml  Output 20 ml  Net 680 ml   Filed Weights   11/13/20 1300  Weight: 51.4 kg    Examination: General: elderly woman on vent HENT: ETT in place, not triggering vent Lungs: has some rhonci on left, right clear Cardiovascular: Irregular, +SEM Abdomen: soft, hypoactive Extremities: no edema Neuro: defer to neuro Skin: extensive bruising along bilateral groin extending up torso, groin access site looks okay with femstop in place, bilateral ext warm, +dopplers per nursing  Resolved Hospital Problem list   N/a  Assessment & Plan:  Acute hypoxemic respiratory failure in setting of large left MCA stroke Hypertensive emergency Hx of Afib previously on [facility] Hypertrophic cardiomyopathy GERD Recent hematoma of unclear etiology requiring AC cessation COPD not in flare  - Vent support, VAP bundle, check ABG/CXR - Brovana/yupelri - Cleviprex/prop titrated to SBP 120-140 - Multidisciplinary discussion on when to resume AC, check echo - Usual vascular checks to LLE - Will follow with you  Best Practice (right click and "Reselect all SmartList Selections" daily)   Diet/type: NPO DVT prophylaxis: SCD GI prophylaxis: PPI Lines: N/A Foley:  N/A Code Status:  full code Last date of multidisciplinary goals of care discussion [Per primary]  Labs   CBC: Recent Labs  Lab 11/13/20 1345 11/13/20 1351  WBC 9.7  --   NEUTROABS 7.3  --   HGB 14.1 13.9  HCT 44.2 41.0  MCV 97.6  --  PLT 240  --     Basic Metabolic Panel: Recent Labs  Lab 11/13/20 1345 11/13/20 1351  NA 141 141  K 4.2 3.5  CL 105 108  CO2 27  --   GLUCOSE 88 89  BUN 24* 28*  CREATININE 0.77 0.60  CALCIUM 8.7*  --    GFR: Estimated Creatinine Clearance: 49.3 mL/min (by C-G formula based on SCr of 0.6 mg/dL). Recent Labs  Lab 11/13/20 1345  WBC 9.7    Liver Function Tests: Recent Labs  Lab 11/13/20 1345  AST 48*  ALT 25  ALKPHOS 60  BILITOT 1.7*  PROT 5.7*   ALBUMIN 3.1*   No results for input(s): LIPASE, AMYLASE in the last 168 hours. No results for input(s): AMMONIA in the last 168 hours.  ABG    Component Value Date/Time   PHART 7.524 (H) 03/10/2015 0039   PCO2ART 40.9 03/10/2015 0039   PO2ART 61.2 (L) 03/10/2015 0039   HCO3 33.5 (H) 03/10/2015 0039   TCO2 27 11/13/2020 1351   ACIDBASEDEF 1.0 03/02/2015 2011   O2SAT 92.2 03/10/2015 0039     Coagulation Profile: Recent Labs  Lab 11/13/20 1345  INR 1.0    Cardiac Enzymes: No results for input(s): CKTOTAL, CKMB, CKMBINDEX, TROPONINI in the last 168 hours.  HbA1C: Hgb A1c MFr Bld  Date/Time Value Ref Range Status  03/09/2015 04:10 AM 6.3 (H) 4.8 - 5.6 % Final    Comment:    (NOTE)         Pre-diabetes: 5.7 - 6.4         Diabetes: >6.4         Glycemic control for adults with diabetes: <7.0     CBG: Recent Labs  Lab 11/13/20 1343  GLUCAP 93    Review of Systems:   Cannot assess, intubated/sedated  Past Medical History:  She,  has a past medical history of Anxiety, Arthritis, Atrial fibrillation (HCC), Bell's palsy, Complication of anesthesia, GERD (gastroesophageal reflux disease), Heart murmur, History of kidney stones, Hypertension, Hypertrophic cardiomyopathy (HCC), Insomnia, Mild aortic sclerosis, Osteopenia, Peripheral vascular disease (HCC), and Syncope.   Surgical History:   Past Surgical History:  Procedure Laterality Date   AUGMENTATION MAMMAPLASTY Bilateral    BACK SURGERY     CARDIAC CATHETERIZATION N/A 05/07/2016   Procedure: Left Heart Cath and Coronary Angiography;  Surgeon: Kathleene Hazel, MD;  Location[facility] INVASIVE CV LAB;  Service: Cardiovascular;  Laterality: N/A;   CARDIOVERSION N/A 09/24/2017   Procedure: CARDIOVERSION;  Surgeon: Laurey Morale, MD;  Location: [facility] ENDOSCOPY;  Service: Cardiovascular;  Laterality: N/A;   DILATION AND CURETTAGE OF UTERUS     ENDARTERECTOMY FEMORAL Right 03/02/2015   Procedure: ENDARTERECTOMY RIGHT  FEMORAL;  Surgeon: Chuck Hint, MD;  Location: [facility] OR;  Service: Vascular;  Laterality: Right;   FACIAL COSMETIC SURGERY Left 2002   "related to Bell's Palsy @ age 56; left eye/side of face droopy; tried to make area symmetrical"   FEMORAL-POPLITEAL BYPASS GRAFT Right 03/02/2015   Procedure: BYPASS GRAFT FEMORAL-BELOW KNEE POPLITEAL ARTERY;  Surgeon: Chuck Hint, MD;  Location: [facility] OR;  Service: Vascular;  Laterality: Right;   INGUINAL HERNIA REPAIR Bilateral 2002   OVARIAN CYST REMOVAL Left    PERIPHERAL VASCULAR CATHETERIZATION N/A 01/16/2015   Procedure: Abdominal Aortogram;  Surgeon: Chuck Hint, MD;  [facility] INVASIVE CV LAB;  Service: Cardiovascular;  Laterality: N/A;   POSTERIOR LUMBAR FUSION  2015   "have plates and screws in there"  TONSILLECTOMY       Social History:   reports that she quit smoking about 5 years ago. Her smoking use included cigarettes. She has a 50.00 pack-year smoking history. She has never used smokeless tobacco. She reports current alcohol use. She reports that she does not use drugs.   Family History:  Her family history includes Breast cancer in her sister; Cancer in her father, mother, and sister; Hypertension in her mother; Liver cancer in her mother; Lung cancer in her father.   Allergies Allergies  Allergen Reactions   Amoxicillin Other (See Comments)    UTI Has patient had a PCN reaction causing immediate rash, facial/tongue/throat swelling, SOB or lightheadedness with hypotension: No Has patient had a PCN reaction causing severe rash involving mucus membranes or skin necrosis: No Has patient had a PCN reaction that required hospitalization: No Has patient had a PCN reaction occurring within the last 10 years: Yes--UTI ONLY If all of the above answers are "NO", then may proceed with Cephalosporin use.    Atenolol Cough   Crestor [Rosuvastatin Calcium] Other (See Comments)    Muscle aches   Other Other (See  Comments)   Pravastatin Other (See Comments)    Muscle aches   Sulfa Antibiotics Nausea Only   Codeine     hallucinations     Home Medications  Prior to Admission medications   Medication Sig Start Date End Date Taking? Authorizing Provider  acetaminophen (TYLENOL) 500 MG tablet Take 1,500 mg by mouth every 6 (six) hours as needed (for pain.).    [provider]  alendronate (FOSAMAX) 70 MG tablet Take 70 mg by mouth every Monday. Take with a full glass of water on an empty stomach.    [provider]  azithromycin (ZITHROMAX) 250 MG tablet Take 2 tablets PO on day one, and one tablet PO daily thereafter until completed. 10/17/20   Margaretann LovelessBurnette, Jennifer M, PA-C  benzonatate (TESSALON) 100 MG capsule Take 1 capsule (100 mg total) by mouth 3 (three) times daily as needed. 10/17/20   Margaretann LovelessBurnette, Jennifer M, PA-C  buPROPion (WELLBUTRIN XL) 150 MG 24 hr tablet Take 150 mg by mouth every morning.     [provider]  Calcium-Phosphorus-Vitamin D (CALCIUM/D3 ADULT GUMMIES PO) Take 2 tablets by mouth daily at 2 PM.    [provider]  DEXILANT 30 MG capsule Take 1 capsule by mouth daily. 12/21/19   [provider]  diltiazem (CARDIZEM) 30 MG tablet Take 1 tablet (30 mg total) by mouth daily as needed (blood pressure higher than 160). 07/02/19   Jake BatheSkains, Mark C, MD  ELIQUIS 5 MG TABS tablet TAKE 1 TABLET(5 MG) BY MOUTH TWICE DAILY 10/24/20   Jake BatheSkains, Mark C, MD  ezetimibe (ZETIA) 10 MG tablet TAKE 1 TABLET(10 MG) BY MOUTH DAILY 03/02/20   Jake BatheSkains, Mark C, MD  hydrALAZINE (APRESOLINE) 25 MG tablet TAKE 1 TABLET BY MOUTH EVERY 8 HOURS AS NEEDED FOR SYSTOLIC BLOOD PRESSURE GREATER THAN 160 07/02/19   Duke SalviaKlein, Steven C, MD  midodrine (PROAMATINE) 10 MG tablet TAKE 1 TABLET BY MOUTH THREE TIMES DAILY WITH MEALS AS NEEDED FOR BLOD PRESSURE LOWER THAN 100(SYSTOLIC) 12/01/19   Jake BatheSkains, Mark C, MD  Multiple Vitamins-Iron (STRESS B COMPLEX/IRON) TABS Take 1 tablet by mouth at bedtime.      [provider]  nebivolol (BYSTOLIC) 10 MG tablet Take 1 tablet (10 mg total) by mouth daily. 09/12/20   Jake BatheSkains, Mark C, MD  potassium chloride (KLOR-CON) 10 MEQ tablet  TAKE 1 TABLET(10 MEQ) BY MOUTH DAILY 05/12/20   Duke Salvia, MD  predniSONE (STERAPRED UNI-PAK 21 TAB) 10 MG (21) TBPK tablet 6 day taper; take as directed on package instructions 10/17/20   Reine Just  [facility] HFA 108 (90 Base) MCG/ACT inhaler Inhale 2 puffs into the lungs 4 (four) times daily as needed (for wheezing/shortness of breath.).  03/03/17   [provider]  TRELEGY ELLIPTA 100-62.5-25 MCG/INH AEPB Take 1 puff by mouth daily. 08/10/20   [provider]  zolpidem (AMBIEN) 10 MG tablet Take 10 mg by mouth at bedtime as needed. 03/10/19   [provider]     Critical care time: 34 minutes not including any separately billable procedures

## 2020-11-13 NOTE — Telephone Encounter (Signed)
Pt currently in [facility] ED for eval - code stroke.

## 2020-11-13 NOTE — Anesthesia Procedure Notes (Addendum)
Arterial Line Insertion Start/End8/04/2020 2:40 PM Performed by: Marena Chancy, CRNA, CRNA  Patient location: Pre-op. Preanesthetic checklist: patient identified, IV checked, site marked, risks and benefits discussed, surgical consent, monitors and equipment checked, pre-op evaluation, timeout performed and anesthesia consent Lidocaine 1% used for infiltration Left, radial was placed Catheter size: 20 Fr Hand hygiene performed  and maximum sterile barriers used   Attempts: 2 Procedure performed without using ultrasound guided technique. Following insertion, dressing applied and Biopatch. Post procedure assessment: normal and unchanged  Post procedure complications: local hematoma. Patient tolerated the procedure well with no immediate complications.

## 2020-11-13 NOTE — Anesthesia Preprocedure Evaluation (Addendum)
Anesthesia Evaluation  Patient identified by MRN, date of birth, ID band Patient awake    Reviewed: Allergy & Precautions, NPO status , Patient's Chart, lab work & pertinent test results  Airway       Comment: Unable to assess; patient obtunded and non-communicative Dental   Pulmonary COPD, former smoker,    breath sounds clear to auscultation       Cardiovascular hypertension, + CAD and + Peripheral Vascular Disease  + dysrhythmias Atrial Fibrillation + Valvular Problems/Murmurs AS  Rhythm:Irregular Rate:Normal     Neuro/Psych PSYCHIATRIC DISORDERS Anxiety  Neuromuscular disease (Bell's palsy)    GI/Hepatic Neg liver ROS, GERD  ,  Endo/Other  negative endocrine ROS  Renal/GU negative Renal ROS  negative genitourinary   Musculoskeletal  (+) Arthritis ,   Abdominal   Peds  Hematology negative hematology ROS (+)   Anesthesia Other Findings Diffuse hematomas over the entirety of her abdomen, breast, hips, legs (unknown cause)  Reproductive/Obstetrics negative OB ROS                            Anesthesia Physical Anesthesia Plan  ASA: 4 and emergent  Anesthesia Plan: General   Post-op Pain Management:    Induction: Intravenous  PONV Risk Score and Plan:   Airway Management Planned:   Additional Equipment: Arterial line  Intra-op Plan:   Post-operative Plan: Post-operative intubation/ventilation  Informed Consent:     Only emergency history available  Plan Discussed with:   Anesthesia Plan Comments: (On arrival, patient extrememly hypertensive and not protecting her airway. We took over from the ED, induced/intubated. Unable to get a history or physical prior to induction due to patient's clinical condition and deemed emergent by radiology. Tanna Furry, MD  )        Anesthesia Quick Evaluation

## 2020-11-13 NOTE — Transfer of Care (Signed)
Immediate Anesthesia Transfer of Care Note  Patient: Beverly Li  Procedure(s) Performed: IR WITH ANESTHESIA  Patient Location: PACU  Anesthesia Type:General  Level of Consciousness: Patient remains intubated per anesthesia plan  Airway & Oxygen Therapy: Patient remains intubated per anesthesia plan and Patient placed on Ventilator (see vital sign flow sheet for setting)  Post-op Assessment: Report given to RN and Post -op Vital signs reviewed and stable  Post vital signs: Reviewed and stable  Last Vitals:  Vitals Value Taken Time  BP 133/72 11/13/20 1558  Temp    Pulse 50 11/13/20 1601  Resp 9 11/13/20 1601  SpO2 100 % 11/13/20 1601  Vitals shown include unvalidated device data.  Last Pain:  Vitals:   11/13/20 1545  PainSc: Asleep         Complications: No notable events documented.

## 2020-11-13 NOTE — ED Triage Notes (Signed)
Pt here from home with complaints of of left sided gaze, total paralyze right side, right side facial droop. Stopped taking eliquis X1 week.  Bruising noted over abdomen, groin, and upper legs.   205/120 CBG 94 HR 72 RR 24 92% RA placed on 2 liters [place] with increase to 99%

## 2020-11-13 NOTE — Progress Notes (Signed)
eLink Physician-Brief Progress Note Patient Name: KACY HEGNA DOB: 1946/03/13 MRN: [id number]   Date of Service  11/13/2020  HPI/Events of Note  Hypokalemia - K+ = 3.4 and Creatinine = 0.82.  eICU Interventions  Will replace K+.     Intervention Category Major Interventions: Electrolyte abnormality - evaluation and management  Brenson Hartman Eugene 11/13/2020, 11:42 PM

## 2020-11-13 NOTE — Progress Notes (Signed)
Pt arrives to unit with black colored underwear and one silver colored ring with black colored stone on patients right hand

## 2020-11-13 NOTE — Sedation Documentation (Signed)
Sheath pulled from left groin site at 1511.  Patient already has bruising and hematoma's on abdomen, pelvis, groins.

## 2020-11-14 ENCOUNTER — Inpatient Hospital Stay (HOSPITAL_COMMUNITY): Payer: Medicare Other

## 2020-11-14 ENCOUNTER — Encounter (HOSPITAL_COMMUNITY): Payer: Self-pay | Admitting: Radiology

## 2020-11-14 ENCOUNTER — Ambulatory Visit: Payer: Medicare Other | Admitting: Student

## 2020-11-14 DIAGNOSIS — I480 Paroxysmal atrial fibrillation: Secondary | ICD-10-CM

## 2020-11-14 DIAGNOSIS — I6389 Other cerebral infarction: Secondary | ICD-10-CM

## 2020-11-14 DIAGNOSIS — I10 Essential (primary) hypertension: Secondary | ICD-10-CM

## 2020-11-14 DIAGNOSIS — I959 Hypotension, unspecified: Secondary | ICD-10-CM

## 2020-11-14 DIAGNOSIS — I421 Obstructive hypertrophic cardiomyopathy: Secondary | ICD-10-CM

## 2020-11-14 DIAGNOSIS — I6602 Occlusion and stenosis of left middle cerebral artery: Secondary | ICD-10-CM | POA: Diagnosis not present

## 2020-11-14 DIAGNOSIS — I63512 Cerebral infarction due to unspecified occlusion or stenosis of left middle cerebral artery: Secondary | ICD-10-CM | POA: Diagnosis not present

## 2020-11-14 LAB — CBC WITH DIFFERENTIAL/PLATELET
Abs Immature Granulocytes: 0.1 10*3/uL — ABNORMAL HIGH (ref 0.00–0.07)
Basophils Absolute: 0 10*3/uL (ref 0.0–0.1)
Basophils Relative: 0 %
Eosinophils Absolute: 0 10*3/uL (ref 0.0–0.5)
Eosinophils Relative: 0 %
HCT: 44.1 % (ref 36.0–46.0)
Hemoglobin: 14.4 g/dL (ref 12.0–15.0)
Immature Granulocytes: 1 %
Lymphocytes Relative: 4 %
Lymphs Abs: 0.7 10*3/uL (ref 0.7–4.0)
MCH: 32.1 pg (ref 26.0–34.0)
MCHC: 32.7 g/dL (ref 30.0–36.0)
MCV: 98.2 fL (ref 80.0–100.0)
Monocytes Absolute: 0.7 10*3/uL (ref 0.1–1.0)
Monocytes Relative: 4 %
Neutro Abs: 15.6 10*3/uL — ABNORMAL HIGH (ref 1.7–7.7)
Neutrophils Relative %: 91 %
Platelets: 246 10*3/uL (ref 150–400)
RBC: 4.49 MIL/uL (ref 3.87–5.11)
RDW: 18.4 % — ABNORMAL HIGH (ref 11.5–15.5)
WBC: 17.2 10*3/uL — ABNORMAL HIGH (ref 4.0–10.5)
nRBC: 0 % (ref 0.0–0.2)

## 2020-11-14 LAB — GLUCOSE, CAPILLARY
Glucose-Capillary: 120 mg/dL — ABNORMAL HIGH (ref 70–99)
Glucose-Capillary: 122 mg/dL — ABNORMAL HIGH (ref 70–99)
Glucose-Capillary: 125 mg/dL — ABNORMAL HIGH (ref 70–99)
Glucose-Capillary: 133 mg/dL — ABNORMAL HIGH (ref 70–99)
Glucose-Capillary: 133 mg/dL — ABNORMAL HIGH (ref 70–99)
Glucose-Capillary: 98 mg/dL (ref 70–99)

## 2020-11-14 LAB — ECHOCARDIOGRAM COMPLETE
AR max vel: 3.86 cm2
AV Area VTI: 3.95 cm2
AV Area mean vel: 3.95 cm2
AV Mean grad: 7 mmHg
AV Peak grad: 14.3 mmHg
Ao pk vel: 1.89 m/s
Area-P 1/2: 2.24 cm2
Height: 63 in
S' Lateral: 2.3 cm
Weight: 1813.06 oz

## 2020-11-14 LAB — PROTIME-INR
INR: 0.9 (ref 0.8–1.2)
Prothrombin Time: 12.3 seconds (ref 11.4–15.2)

## 2020-11-14 LAB — BASIC METABOLIC PANEL
Anion gap: 12 (ref 5–15)
BUN: 25 mg/dL — ABNORMAL HIGH (ref 8–23)
CO2: 21 mmol/L — ABNORMAL LOW (ref 22–32)
Calcium: 8.4 mg/dL — ABNORMAL LOW (ref 8.9–10.3)
Chloride: 108 mmol/L (ref 98–111)
Creatinine, Ser: 0.9 mg/dL (ref 0.44–1.00)
GFR, Estimated: >60 mL/min (ref >60)
Glucose, Bld: 118 mg/dL — ABNORMAL HIGH (ref 70–99)
Potassium: 4.7 mmol/L (ref 3.5–5.1)
Sodium: 141 mmol/L (ref 135–145)

## 2020-11-14 MED ORDER — LORAZEPAM 2 MG/ML IJ SOLN: 1.0000 mg | Freq: Once | INTRAMUSCULAR | Status: AC

## 2020-11-14 MED ORDER — CLEVIDIPINE BUTYRATE 0.5 MG/ML IV EMUL
0.0000 mg/h | INTRAVENOUS | Status: DC
  Administered 2020-11-14: 6 mg/h via INTRAVENOUS
  Administered 2020-11-14: 12 mg/h via INTRAVENOUS
  Administered 2020-11-15: 6 mg/h via INTRAVENOUS
  Administered 2020-11-15 (×2): 8 mg/h via INTRAVENOUS
  Filled 2020-11-14 (×2): qty 100

## 2020-11-14 MED ORDER — ORAL CARE MOUTH RINSE
15.0000 mL | Freq: Two times a day (BID) | OROMUCOSAL | Status: DC
  Administered 2020-11-14 – 2020-11-16 (×6): 15 mL via OROMUCOSAL

## 2020-11-14 MED ORDER — LORAZEPAM 2 MG/ML IJ SOLN
INTRAMUSCULAR | Status: AC
  Administered 2020-11-14: 1 mg
  Filled 2020-11-14: qty 1

## 2020-11-14 NOTE — Anesthesia Postprocedure Evaluation (Addendum)
Anesthesia Post Note  Patient: Beverly Li  Procedure(s) Performed: IR WITH ANESTHESIA     Patient location during evaluation: PACU Anesthesia Type: General Level of consciousness: sedated and patient remains intubated per anesthesia plan Pain management: pain level controlled Vital Signs Assessment: post-procedure vital signs reviewed and stable Respiratory status: patient remains intubated per anesthesia plan and patient on ventilator - see flowsheet for VS Cardiovascular status: stable (BP elevated, will control with sedation and anti-hypertensivies as needed) Postop Assessment: no apparent nausea or vomiting Anesthetic complications: no   No notable events documented.  Last Vitals:  Vitals:   11/14/20 0757 11/14/20 0800  BP:    Pulse:    Resp:    Temp:  37.1 C  SpO2: 96%     Last Pain:  Vitals:   11/14/20 0800  TempSrc: Axillary  PainSc:                  Mellody Dance

## 2020-11-14 NOTE — Progress Notes (Signed)
  Echocardiogram 2D Echocardiogram has been performed.  Beverly Li 11/14/2020, 10:11 AM

## 2020-11-14 NOTE — Progress Notes (Addendum)
STROKE TEAM PROGRESS NOTE    Interval History  No acute events overnight, remains intubated. Family at bedside. Extensive conversation was held with Beverly Li son and husband regarding the patient stroke, stroke deficits and imaging results.   Neurological exam is stable; pertinent for RUE and RLE weakness. Beverly Li was also noted to be have a rhythmic shaking movement to Beverly Li RUE- given 1 mg of Ativan IV for potential seizure.   Pertinent Lab Work and Imaging    11/13/20 CT Head WO IV Contrast 1. Mild asymmetric hypodensity of the left insula, concerning for acute left MCA territory infarct. An MRI could further evaluate. 2. Hyperdense appearance of the distal left M1 MCA, concerning for thrombus. Recommend CTA to further evaluate. 3. No acute hemorrhage. 4. Remote lacunar infarcts versus dilated perivascular spaces in bilateral inferior basal ganglia. Suspected remote lacunar infarct in the right cerebellum.  11/13/20 CT Angio Head and Neck W WO IV Contrast 1. Occlusion of the left M1 MCA with very little opacification of distal left MCA branches. Perfusion demonstrates the core infarct of 41 mL in the anterior left MCA territory with large (122 ml) of ischemic phenomena in the remaining left MCA and potentially left ACA territories. 2. Age indeterminate occlusion of the proximal right internal carotid artery in the neck with non-opacification through the carotid terminus. The right MCA and right posterior communicating artery are opacified via the of the right A1 ACA. 3. Suspected high-grade stenosis versus occlusion of the distal left A3/A4 ACA. 4. Approximately 40-50% stenosis of the proximal left ICA in the neck. 5. Severe stenosis of the non dominant/small right vertebral artery origin. Moderate stenosis of the intradural left vertebral artery. 6. Severe emphysema.  11/14/20 MRI Brain WO IV Contrast 1. 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 Associated edema without mass  effect. 2. Additional scattered punctate acute infarcts in the right frontoparietal cortex and small acute infarct in the right cerebellum.  11/14/20 Echocardiogram Complete   1. There is no evidence of systolic anterior motion of the mitral valve or true LV outflow obstruction on this study. LVOT velocities are mildly elevated at 2 m/s. Left ventricular ejection fraction, by estimation, is 60 to 65%. The left ventricle has low normal function. The left ventricle has no regional wall motion abnormalities. There is moderate concentric left ventricular hypertrophy. Left ventricular diastolic parameters are consistent with Grade II diastolic dysfunction (pseudonormalization). Elevated left atrial pressure.   2. Right ventricular systolic function is normal. The right ventricular size is normal.   3. Left atrial size was severely dilated.   4. Right atrial size was mildly dilated.   5. The mitral valve is degenerative. Mild to moderate mitral valve regurgitation.   6. The aortic valve is tricuspid. There is moderate calcification of the aortic valve. There is mild thickening of the aortic valve. Aortic valve regurgitation is mild. Mild to moderate aortic valve  sclerosis/calcification is present, without any evidence of aortic stenosis.   7. The inferior vena cava is dilated in size with <50% respiratory variability, suggesting right atrial pressure of 15 mmHg.   Physical Examination   Constitutional: Elderly Caucasian female resting in bed, intubated and sedated  Cardiovascular: Normal RR Respiratory: No increased WOB   Mental status: Unable to answer LOC questions due to intubation/aphasia, does not follow commands opens eyes but is aphasic.   Speech:  UTA  Cranial nerves: EOMI with skewed gaze( right eye is p slightly hypertropic, Decreased BTT on the right, UTA  face symmetry due to ET tube, UTA tongue due to lack of command following  Motor: Normal bulk and tone. RUE plegic, RLE withdraws subtly  to noxious stimulation. LUE antigravity, Beverly Li is able to maintain Beverly Li left leg up when it is flexed in the bed by the examiner  Sensory: Decreased sensation to the right  Coordination: UTA  Gait: Deferred due to right sided weakness   Assessment and Plan   Beverly Li is a 75 y.o. female w/pmh of AFib on Eliquis (off x 1 week), COPD, remote tobacco abuse, hypertrophic cardiomyopathy, aortic stenosis, PAD, anxiety, and GERD who presents with right sided weakness, right facial droop, and left gaze preference. Beverly Li did not qualify for IVTPA but underwent thrombectomy with TICI 3 revascularization   NEURO # LMCA Stroke status post mechanical thrombectomy with TICI 3 revascularization in the setting of atrial fibrillation( taken off anticoagulation due to hematoma)   Patient presented with the symptoms described above. At this time, Beverly Li stroke work up is complete. CTH w/L MCA stroke. MRI Brain revealed left MCA and ACA stroke. CTA Head and Neck showed occlusion of the left MCA and an age indeterminate occlusion of the proximal right internal carotid artery in the neck with non-opacification through the carotid terminus. Echo w/EF 60 to 65 %, left atrium severely dilated. Stroke labs w/LDL 120 + hemoglobin a1c 6.0. Stroke etiology is atrial fibrillation off of anticoagulation due to hematoma.  - Holding ASA due to hematoma  - Atorvastatin 40 for stroke prevention when NGT is placed or Beverly Li passes swallow eval  - Will place referral at discharge for stroke follow up   CARDS #Hypertension Beverly Li has a history of HTN and atrial fibrillation; Beverly Li takes Diltiazem 30 mg QD, Nebivolol 10 mg QD and Hydralazine 25 mg Q8 at home. Currently blood pressure is normotensive. Are managing blood pressure with Cleviprex at this time.  - SBP goal 120-140 given thrombectomy   #Hyperlipidemia From a stroke prevention stand point, the LDL goal is < 70. LDL is 120, will start Atorvastatin 40 mg when enteral access is  obtained.   RESP #Hypoxic Respiratory Failure  #History of COPD  Was intubated due to respiratory distress, extubated on 11/14/20. Extubated to 4L [place].  - CCM managing   RENAL Trending lytes and renal function, BUN elevated are trending   GI Currently NPO, will likely need NGT and possibly peg tube  ENDO #Stroke Diabetes Screening  Hemoglobin A1C this admission noted to be 6, prediabetic range. Managing hyperglycemia with SSI this admission.   HEME #Hematoma  Hemoglobin hematocrit and platelet count stable. Beverly Li was recently hospitalized for a hematoma that developed due to coughing + Eliquis use.  ID Afebrile. No infectious processes at this time.   Hospital day # 1  Stark Jock, NP  [facility] Nurse Practitioner Patient seen and discussed with attending physician Dr. Pearlean Brownie   Stroke MD Note: I have personally obtained history,examined this patient, reviewed notes, independently viewed imaging studies, participated in medical decision making and plan of care.ROS completed by me personally and pertinent positives fully documented  I have made any additions or clarifications directly to the above note. Agree with note above.  Patient presented with left M1 occlusion outside tPA window and was treated with successful mechanical thrombectomy with good recanalization but MRI shows left frontal and some left ACA infarcts and clinical exam demonstrated significant aphasia with right hemiparesis.  Recommend close neurological monitoring and strict blood pressure control as per post  intervention protocol.  Extubate as tolerated per critical care team.  May need to consider starting IV heparin given Beverly Li has atrial fibrillation and the stroke occurred when anticoagulation was heldfor abdominal wall bruising.  Discussed with Dr. Merrily Pew critical care medicine.  Long discussion with the patient's husband and son at the bedside and answered questions about Beverly Li prognosis, evaluation and  treatment plan and discussion with care team. This patient is critically ill and at significant risk of neurological worsening, death and care requires constant monitoring of vital signs, hemodynamics,respiratory and cardiac monitoring, extensive review of multiple databases, frequent neurological assessment, discussion with family, other specialists and medical decision making of high complexity.I have made any additions or clarifications directly to the above note.This critical care time does not reflect procedure time, or teaching time or supervisory time of PA/NP/Med Resident etc but could involve care discussion time.  I spent 50 minutes of neurocritical care time  in the care of  this patient.      Delia Heady, MD Medical Director [facility] Pager: [phone] 11/14/2020 10:23 PM    To contact Stroke Continuity provider, please refer to [email or web address]. After hours, contact General Neurology

## 2020-11-14 NOTE — Procedures (Signed)
Extubation Procedure Note  Patient Details:   Name: Beverly Li DOB: 1946-02-10 MRN: [id number]   Airway Documentation:    Vent end date: 11/14/20 Vent end time: 1340   Evaluation  O2 sats: stable throughout Complications: No apparent complications Patient did tolerate procedure well. Bilateral Breath Sounds: Diminished, Clear   No  RT extubated patient to 4L [place] per MD order with RN at bedside. Positive cuff leak noted. Patient tolerating well at this time. No stridor noted. RT will continue to monitor as needed.   Jaquelyn Bitter 11/14/2020, 1:47 PM

## 2020-11-14 NOTE — Progress Notes (Signed)
NAME:  Beverly Li, MRN:  [id number], DOB:  1945-07-27, LOS: 1 ADMISSION DATE:  11/13/2020, CONSULTATION DATE:  11/13/2020 REFERRING MD:  Derry Lory CHIEF COMPLAINT:  AMS   History of Present Illness:  75 year old woman with PMHx significant for Afib (on Eliquis, held x 1 week), AS, COPD, PAD, anxiety, GERD who presented to [facility] 8/1 with right-sided weakness, right facial droop and left gaze preference.    Of note, patient's AC recently held due to hospitalization for a large hematoma 2/2 significant coughing as a result of pneumonia. She was recently admitted to [facility] in [place]. [place], [place] and while hospitalized went into Afib with RVR, kept for several days to manage. No medications were changed at discharge. She was advised to follow up with cardiology for AF management and when to restart anticoagulation.  CT Head on arrival to [facility] demonstrated signs of L MCA occlusion; patient was subsequently intubated for AMS and airway protection.  Taken to [facility] for thrombectomy with TICI 3 flow after 1 pass to L MCA M1 seg. Brought to ICU  on vent, Cleviprex and Propofol gtts.   PCCM consulted for vent and ICU BP management.  Pertinent Medical History:  Aortic stenosis Hypertrophic cardiomyopathy Afib on Tikosyn, AC Recent hospitalization for ?hematoma unclear origin PVD COPD no PFTs on file  Significant Hospital Events: Including procedures, antibiotic start and stop dates in addition to other pertinent events   8/1 admitted, intubated, thrombectomy 8/2 Successful thrombectomy with complete revasculatization, TICI 3 flow. MRI today to assess territories. Remains intubated, though moving L side and opening eyes/tracking; not following commands.  Interim History / Subjective:  Mildly agitated this AM, appears uncomfortable 2/2 ETT Husband and son at bedside Per husband, when patient had been intubated previously - concern for readiness to extubate; however, patient  self-extubated and did well MRI today with plan for extubation post-MRI  Objective   Blood pressure 131/60, pulse 60, temperature 98.8 F (37.1 C), temperature source Axillary, resp. rate 19, height 5\' 3"  (1.6 m), weight 51.4 kg, SpO2 94 %.    Vent Mode: PRVC FiO2 (%):  [40 %] 40 % Set Rate:  [14 bmp] 14 bmp Vt Set:  [410 mL] 410 mL PEEP:  [5 cmH20] 5 cmH20 Plateau Pressure:  [5 cmH20-17 cmH20] 13 cmH20   Intake/Output Summary (Last 24 hours) at 11/14/2020 1121 Last data filed at 11/14/2020 0900 Gross per 24 hour  Intake 2108.18 ml  Output 720 ml  Net 1388.18 ml    Filed Weights   11/13/20 1300  Weight: 51.4 kg   Physical Examination: General: Acutely ill-appearing elderly woman in NAD. HEENT: [place]/AT, anicteric sclera, PERRL, moist mucous membranes. ETT in place. Neuro: Awake, unable to assess orientation. Opens eyes to verbal stimuli and tracks briefly.Not following commands. Unilateral R-sided neglect noted. +Cough and +Gag  CV: RRR, harsh III/VI systolic murmur heard best at RUSB. PULM: Breathing even and unlabored on vent (PEEP 5, FiO2 40%). Lung fields CTAB anteriorly. GI: Soft, nontender, nondistended. Normoactive bowel sounds. Extremities: No LE edema noted. Skin: Warm/dry, extensive ecchymosis noted to anterior lower abdomen/pelvic region.  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 CT Head demonstrating c/f L MCA territory infarct. Taken to [facility] for intervention; successful complete revascularization with 1 pass, TICI 3 flow. - Management per Stroke team, NIR - Goal SBP 120-140 - Cleviprex titrated  to goal SBP - MRI to be obtained today while still intubated, f/u - S/p NIR intervention, monitor LLE vascular access site/continue vascular checks per protocol - Neuroprotective measures: HOB > 30 degrees, normoglycemia,  normothermia, electrolytes WNL - AC resumption pending multidisciplinary discussion  Acute hypoxemic respiratory failure in setting of large left MCA stroke History of COPD, not in flare - Continue full vent support (4-8cc/kg IBW) - Likely able to extubate post-MRI today 8/2, per patient's husband patient has self-extubated in the past - Wean FiO2 for O2 sat > 90% - Daily WUA/SBT - VAP bundle - Pulmonary hygiene  - Continue Roxy Manns - PAD protocol for sedation: Fentanyl for goal RASS 0 to -1, limiting sedating medications as able to optimize for vent wean/extubation  Hypertensive emergency - Cleviprex gtt as above - Goal SBP 120-140 per Stroke team/NIR  Afib previously on [facility] Hypertrophic cardiomyopathy Recent hematoma of unclear etiology requiring AC cessation History of Afib. Recently hospitalized for PNA, Afib with RVR. AC recently held x 1 week due to hematoma sustained 2/2 ?profuse coughing. - F/u Echo today, 8/2 - AC resumption will require multidisciplinary discussion  GERD - Continue PPI  Best Practice (right click and "Reselect all SmartList Selections" daily)   Diet/type: NPO DVT prophylaxis: SCD GI prophylaxis: PPI Lines: N/A Foley:  N/A Code Status:  full code Last date of multidisciplinary goals of care discussion [Per primary]  Critical care time: 40 minutes   Tim Lair, PA-C [place] [facility] 11/14/20 11:22 AM  Please see [email or web address] for pager details.  From 7A-7P if no response, please call [phone] After hours, please call ELink [phone]

## 2020-11-14 NOTE — Progress Notes (Signed)
Chief Complaint: Patient was seen today for CVA post intervention  Supervising Physician: Luanne Bras  Patient Status: [facility] - In-pt  Subjective: S/P complete revascularization of occluded Lt MCA M 1 seg with x 1 pass of contact aspiration and prox flow arrest achieving a TICI 3 revascularization. Remains intubated. Husband at bedside. Per family and RN, moving left arm and leg sometimes. No movement on left, not following commands.  Objective: Physical Exam: BP 97/85   Pulse (!) 56   Temp 97.9 F (36.6 C) (Axillary)   Resp 20   Ht 5' 3" (1.6 m)   Wt 51.4 kg   LMP  (LMP Unknown)   SpO2 96%   BMI 20.07 kg/m  Intubated. Does not follow commands, but appears to attempt to track with eyes. Moved left hand and leg, but not to command. (L)groin soft, NT. Left leg warm, palpable pedal pulse   Current Facility-Administered Medications:    0.9 %  sodium chloride infusion, , Intravenous, Continuous, Deveshwar, Sanjeev, MD, Last Rate: 75 mL/hr at 11/14/20 0455, New Bag at 11/14/20 0455   acetaminophen (TYLENOL) tablet 650 mg, 650 mg, Oral, Q4H PRN **OR** acetaminophen (TYLENOL) 160 MG/5ML solution 650 mg, 650 mg, Per Tube, Q4H PRN **OR** acetaminophen (TYLENOL) suppository 650 mg, 650 mg, Rectal, Q4H PRN, Kirby-Graham, Karsten Fells, NP   arformoterol (BROVANA) nebulizer solution 15 mcg, 15 mcg, Nebulization, BID, Candee Furbish, MD, 15 mcg at 11/14/20 0754   chlorhexidine gluconate (MEDLINE KIT) (PERIDEX) 0.12 % solution 15 mL, 15 mL, Mouth Rinse, BID, Donnetta Simpers, MD, 15 mL at 11/14/20 0739   Chlorhexidine Gluconate Cloth 2 % PADS 6 each, 6 each, Topical, Daily, Donnetta Simpers, MD, 6 each at 11/13/20 1725   clevidipine (CLEVIPREX) infusion 0.5 mg/mL, 0-21 mg/hr, Intravenous, Continuous, Deveshwar, Sanjeev, MD, Last Rate: 24 mL/hr at 11/14/20 0739, 12 mg/hr at 11/14/20 0739   enoxaparin (LOVENOX) injection 40 mg, 40 mg, Subcutaneous, q1600, Donnetta Simpers, MD    fentaNYL (SUBLIMAZE) injection 50 mcg, 50 mcg, Intravenous, Q1H PRN, Candee Furbish, MD   insulin aspart (novoLOG) injection 0-9 Units, 0-9 Units, Subcutaneous, Q4H, Candee Furbish, MD, 1 Units at 11/14/20 0419   MEDLINE mouth rinse, 15 mL, Mouth Rinse, 10 times per day, Donnetta Simpers, MD, 15 mL at 11/14/20 0602   ondansetron (ZOFRAN) injection 4 mg, 4 mg, Intravenous, Q6H PRN, Kirby-Graham, Karsten Fells, NP   pantoprazole (PROTONIX) injection 40 mg, 40 mg, Intravenous, QHS, Candee Furbish, MD, 40 mg at 11/13/20 2145   revefenacin (YUPELRI) nebulizer solution 175 mcg, 175 mcg, Nebulization, Daily, Donnetta Simpers, MD, 175 mcg at 11/14/20 0754  Labs: CBC Recent Labs    11/13/20 1345 11/13/20 1351 11/13/20 1742 11/14/20 0237  WBC 9.7  --   --  17.2*  HGB 14.1   < > 13.9 14.4  HCT 44.2   < > 41.0 44.1  PLT 240  --   --  246   < > = values in this interval not displayed.   BMET Recent Labs    11/13/20 2142 11/14/20 0237  NA 137 141  K 3.4* 4.7  CL 105 108  CO2 22 21*  GLUCOSE 142* 118*  BUN 23 25*  CREATININE 0.82 0.90  CALCIUM 8.0* 8.4*   LFT Recent Labs    11/13/20 1345  PROT 5.7*  ALBUMIN 3.1*  AST 48*  ALT 25  ALKPHOS 60  BILITOT 1.7*   PT/INR Recent Labs    11/13/20 1345 11/14/20 0237  LABPROT 12.9 12.3  INR 1.0 0.9     Studies/Results: DG Chest 1 View  Result Date: 11/14/2020 CLINICAL DATA:  Respiratory failure, endotracheal intubation EXAM: CHEST  1 VIEW COMPARISON:  11/13/2020 FINDINGS: Endotracheal tube seen 6.8 cm above the carina. Nasogastric tube extends into the upper abdomen beyond the margin of the examination. Pulmonary insufflation is normal and symmetric. Chronic interstitial changes are noted at the lung bases bilaterally. No superimposed confluent pulmonary infiltrate. No pneumothorax or pleural effusion. Prominent skin fold noted at the right apex. Capsular calcification related to right breast implant is again noted. Mild cardiomegaly  is stable. Pulmonary vascularity is normal. IMPRESSION: Support tubes in appropriate position. Preserved pulmonary insufflation. Stable cardiomegaly. Electronically Signed   By: Fidela Salisbury MD   On: 11/14/2020 04:40   DG Chest 1 View  Result Date: 11/13/2020 CLINICAL DATA:  Intubation EXAM: CHEST  1 VIEW COMPARISON:  04/27/2020 x-ray, MRI 09/01/2018 FINDINGS: Endotracheal tube terminates approximately 4.8 cm above the carina. Mild cardiomegaly. Atherosclerotic calcification of the aortic knob. Diffusely increased interstitial markings within the lung bases. Area of lucency at the left costophrenic angle likely reflecting a skin fold. A small pneumothorax would be difficult to exclude. Osseous structures are demineralized. Chronic vertebral body height loss at approximately T7 compatible with site of prior discitis-osteomyelitis. IMPRESSION: 1. Endotracheal tube terminates approximately 4.8 cm above the carina. 2. Diffusely increased interstitial markings within the lung bases may reflect edema versus infection. 3. Area of relative lucency at the left costophrenic angle likely reflecting a skin fold. A small pneumothorax would be difficult to exclude. Recommend repeat chest x-ray after patient positioning, preferably upright. Electronically Signed   By: Davina Poke D.O.   On: 11/13/2020 16:54   DG CHEST PORT 1 VIEW  Result Date: 11/13/2020 CLINICAL DATA:  Intubated, enteric catheter placement, CVA EXAM: PORTABLE CHEST 1 VIEW COMPARISON:  The 122 at 4:42 p.m. FINDINGS: 2 supine frontal views of the chest are obtained. Endotracheal tube overlies tracheal air column, tip just below thoracic inlet. Enteric catheter passes below diaphragm tip excluded by collimation. Cardiac silhouette is enlarged but stable. Chronic central vascular congestion without airspace disease, effusion, or pneumothorax. Prominent skin fold overlies the right apex. IMPRESSION: 1. Support devices as above. 2. Chronic vascular  congestion.  No acute airspace disease. Electronically Signed   By: Randa Ngo M.D.   On: 11/13/2020 18:34   DG Abd Portable 1V  Result Date: 11/13/2020 CLINICAL DATA:  Enteric catheter placement EXAM: PORTABLE ABDOMEN - 1 VIEW COMPARISON:  03/08/2015 FINDINGS: Frontal view of the abdomen and pelvis was obtained, excluding the lower pelvis by collimation. Enteric catheter passes below diaphragm, tip projecting over region of the gastric antrum. Bowel gas pattern is unremarkable. Excreted contrast within the kidneys from preceding interventional procedure. IMPRESSION: 1. Enteric catheter tip projecting over the gastric antrum. Electronically Signed   By: Randa Ngo M.D.   On: 11/13/2020 18:32   CT HEAD CODE STROKE WO CONTRAST  Result Date: 11/13/2020 CLINICAL DATA:  Code stroke. EXAM: CT HEAD WITHOUT CONTRAST TECHNIQUE: Contiguous axial images were obtained from the base of the skull through the vertex without intravenous contrast. COMPARISON:  None. FINDINGS: Brain: Asymmetric hypodensity of the left insula, concerning for acute left MCA territory infarct. No acute hemorrhage, hydrocephalus, extra-axial collection or mass lesion/mass effect. Remote infarcts versus dilated perivascular spaces in bilateral inferior basal ganglia. Mild additional patchy white matter hypoattenuation, most likely related to chronic microvascular ischemic disease. Suspected remote lacunar infarct in  the right cerebellum. Vascular: Hyperdense appearance of the distal left M1 MCA. Skull: No acute fracture. Sinuses/Orbits: Clear sinuses.  Unremarkable orbits. Other: No mastoid effusions. ASPECTS [facility] Stroke Program Early CT Score) - Ganglionic level infarction (caudate, lentiform nuclei, internal capsule, insula, M1-M3 cortex): 6 - Supraganglionic infarction (M4-M6 cortex): 3 Total score (0-10 with 10 being normal): 9 IMPRESSION: 1. Mild asymmetric hypodensity of the left insula, concerning for acute left MCA territory  infarct. An MRI could further evaluate. 2. Hyperdense appearance of the distal left M1 MCA, concerning for thrombus. Recommend CTA to further evaluate. 3. No acute hemorrhage. 4. Remote lacunar infarcts versus dilated perivascular spaces in bilateral inferior basal ganglia. Suspected remote lacunar infarct in the right cerebellum. Finding discussed with Dr. Lorrin Goodell via telephone at 1:55 p.m. Electronically Signed   By: Margaretha Sheffield MD   On: 11/13/2020 14:03   CT ANGIO HEAD NECK W WO CM W PERF (CODE STROKE)  Result Date: 11/13/2020 EXAM: CT ANGIOGRAPHY HEAD AND NECK CT PERFUSION BRAIN TECHNIQUE: Multidetector CT imaging of the head and neck was performed using the standard protocol during bolus administration of intravenous contrast. Multiplanar CT image reconstructions and MIPs were obtained to evaluate the vascular anatomy. Carotid stenosis measurements (when applicable) are obtained utilizing NASCET criteria, using the distal internal carotid diameter as the denominator. Multiphase CT imaging of the brain was performed following IV bolus contrast injection. Subsequent parametric perfusion maps were calculated using RAPID software. CONTRAST:  122m OMNIPAQUE IOHEXOL 350 MG/ML SOLN COMPARISON:  None. FINDINGS: CTA HEAD FINDINGS Anterior circulation: Left ICA is patent with mild to moderate calcific atherosclerosis. Right ICA is non-opacified to the carotid terminus. The right MCA and right posterior communicating artery are opacified, likely the of the right A1 ACA. There is abrupt occlusion of the left M1 MCA with very little opacification of distal MCA branches. Left A1 and A2 ACAs are patent. Loss of opacification of the distal left ACA (A3/A4) on series 7, images 299 through 302, concerning for high-grade stenosis or occlusion. Posterior circulation: Moderate stenosis of the proximal intradural left vertebral artery due to predominately calcific atherosclerosis. Non dominant right intradural vertebral  artery. The basilar artery bilateral posterior cerebral arteries are patent without evidence of proximal flow limiting stenosis. Prominent right posterior communicating artery with hypoplastic or absent right A1 ACA, anatomic variant. Venous sinuses: As permitted by contrast timing, patent. Anatomic variants: As detailed above. CTA NECK FINDINGS Aortic arch: Atherosclerosis.  Great vessel origins are patent. Right carotid system: Patent common carotid artery. There is mixed calcific and noncalcific atherosclerosis at the carotid bifurcation with occlusion of the proximal internal carotid artery. The remainder of the ICA is non-opacified. Left carotid system: Patent common carotid artery. Calcific and noncalcific atherosclerosis at the carotid bifurcation with approximately 40-50% stenosis Vertebral arteries: Left dominant. Severe stenosis of the non dominant right vertebral artery origins. Skeleton: Severe degenerative disease at C5-C6. Other neck: No acute findings.  Severe emphysema. Upper chest: Severe emphysema. Review of the MIP images confirms the above findings CT Brain Perfusion Findings: ASPECTS: 9 CBF (<30%) Volume: 432mPerfusion (Tmax>6.0s) volume: 16382mismatch Volume: 122m88mfarction Location:Anterior left MCA territory IMPRESSION: 1. Occlusion of the left M1 MCA with very little opacification of distal left MCA branches. Perfusion demonstrates the core infarct of 41 mL in the anterior left MCA territory with large (122 ml) of ischemic phenomena in the remaining left MCA and potentially left ACA territories. 2. Age indeterminate occlusion of the proximal right internal carotid artery in  the neck with non-opacification through the carotid terminus. The right MCA and right posterior communicating artery are opacified via the of the right A1 ACA. 3. Suspected high-grade stenosis versus occlusion of the distal left A3/A4 ACA. 4. Approximately 40-50% stenosis of the proximal left ICA in the neck. 5.  Severe stenosis of the non dominant/small right vertebral artery origin. Moderate stenosis of the intradural left vertebral artery. 6. Severe emphysema. Critical findings discussed with Dr. Lorrin Goodell at 1:57 p.m. Electronically Signed   By: Margaretha Sheffield MD   On: 11/13/2020 14:30    Assessment/Plan: [facility] CVA S/P complete revascularization of occluded Lt MCA M 1 seg with x 1 pass of contact aspiration and prox flow arrest achieving a TICI 3 revascularization Intubated but awake. Await MRI this am. Has critical pre-occlusive (R)ICA stenosis as well.    LOS: 1 day   I spent a total of 20 minutes in face to face in clinical consultation, greater than 50% of which was counseling/coordinating care for CVA post intervention.  Ascencion Dike PA-C 11/14/2020 8:33 AM

## 2020-11-14 NOTE — Progress Notes (Signed)
RT transported patient from [id number] to MRI and back with RN. No complications and vital signs stable. RT will continue to monitor.

## 2020-11-14 NOTE — Progress Notes (Signed)
Systolic blood pressure goal less than 150 per Dr. Corliss Skains.  Previous order systolic blood pressure 120-140.   Cleviprex order modified.

## 2020-11-15 ENCOUNTER — Telehealth: Payer: Self-pay | Admitting: Cardiology

## 2020-11-15 ENCOUNTER — Inpatient Hospital Stay (HOSPITAL_COMMUNITY): Payer: Medicare Other

## 2020-11-15 DIAGNOSIS — I161 Hypertensive emergency: Secondary | ICD-10-CM | POA: Diagnosis not present

## 2020-11-15 DIAGNOSIS — I6602 Occlusion and stenosis of left middle cerebral artery: Secondary | ICD-10-CM | POA: Diagnosis not present

## 2020-11-15 LAB — GLUCOSE, CAPILLARY
Glucose-Capillary: 107 mg/dL — ABNORMAL HIGH (ref 70–99)
Glucose-Capillary: 111 mg/dL — ABNORMAL HIGH (ref 70–99)
Glucose-Capillary: 120 mg/dL — ABNORMAL HIGH (ref 70–99)
Glucose-Capillary: 126 mg/dL — ABNORMAL HIGH (ref 70–99)
Glucose-Capillary: 130 mg/dL — ABNORMAL HIGH (ref 70–99)
Glucose-Capillary: 98 mg/dL (ref 70–99)

## 2020-11-15 LAB — BASIC METABOLIC PANEL
Anion gap: 11 (ref 5–15)
BUN: 24 mg/dL — ABNORMAL HIGH (ref 8–23)
CO2: 22 mmol/L (ref 22–32)
Calcium: 8.3 mg/dL — ABNORMAL LOW (ref 8.9–10.3)
Chloride: 109 mmol/L (ref 98–111)
Creatinine, Ser: 0.8 mg/dL (ref 0.44–1.00)
GFR, Estimated: >60 mL/min (ref >60)
Glucose, Bld: 90 mg/dL (ref 70–99)
Potassium: 4.1 mmol/L (ref 3.5–5.1)
Sodium: 142 mmol/L (ref 135–145)

## 2020-11-15 LAB — PHOSPHORUS: Phosphorus: 2.6 mg/dL (ref 2.5–4.6)

## 2020-11-15 LAB — CBC
HCT: 42.6 % (ref 36.0–46.0)
Hemoglobin: 13.4 g/dL (ref 12.0–15.0)
MCH: 31 pg (ref 26.0–34.0)
MCHC: 31.5 g/dL (ref 30.0–36.0)
MCV: 98.6 fL (ref 80.0–100.0)
Platelets: 190 10*3/uL (ref 150–400)
RBC: 4.32 MIL/uL (ref 3.87–5.11)
RDW: 19.3 % — ABNORMAL HIGH (ref 11.5–15.5)
WBC: 18.1 10*3/uL — ABNORMAL HIGH (ref 4.0–10.5)
nRBC: 0 % (ref 0.0–0.2)

## 2020-11-15 LAB — PROTIME-INR
INR: 1 (ref 0.8–1.2)
Prothrombin Time: 12.6 seconds (ref 11.4–15.2)

## 2020-11-15 LAB — HEPARIN LEVEL (UNFRACTIONATED): Heparin Unfractionated: 0.12 IU/mL — ABNORMAL LOW (ref 0.30–0.70)

## 2020-11-15 LAB — MAGNESIUM
Magnesium: 1.9 mg/dL (ref 1.7–2.4)
Magnesium: 1.9 mg/dL (ref 1.7–2.4)

## 2020-11-15 MED ORDER — NEBIVOLOL HCL 10 MG PO TABS
10.0000 mg | ORAL_TABLET | Freq: Every day | ORAL | Status: DC
  Filled 2020-11-15: qty 1

## 2020-11-15 MED ORDER — ALBUTEROL SULFATE (2.5 MG/3ML) 0.083% IN NEBU
3.0000 mL | INHALATION_SOLUTION | Freq: Four times a day (QID) | RESPIRATORY_TRACT | Status: DC | PRN
  Administered 2020-11-18 – 2020-11-24 (×2): 3 mL via RESPIRATORY_TRACT
  Filled 2020-11-15 (×2): qty 3

## 2020-11-15 MED ORDER — BUDESONIDE 0.25 MG/2ML IN SUSP
0.2500 mg | Freq: Two times a day (BID) | RESPIRATORY_TRACT | Status: DC
  Administered 2020-11-15 – 2020-11-23 (×17): 0.25 mg via RESPIRATORY_TRACT
  Filled 2020-11-15 (×16): qty 2

## 2020-11-15 MED ORDER — AMIODARONE HCL IN DEXTROSE 360-4.14 MG/200ML-% IV SOLN
60.0000 mg/h | INTRAVENOUS | Status: DC
  Administered 2020-11-15 (×2): 60 mg/h via INTRAVENOUS
  Filled 2020-11-15 (×2): qty 200

## 2020-11-15 MED ORDER — POTASSIUM CHLORIDE CRYS ER 10 MEQ PO TBCR: 10.0000 meq | EXTENDED_RELEASE_TABLET | Freq: Every day | ORAL | Status: DC

## 2020-11-15 MED ORDER — PROSOURCE TF PO LIQD
45.0000 mL | Freq: Two times a day (BID) | ORAL | Status: DC
  Administered 2020-11-15 – 2020-12-06 (×41): 45 mL
  Filled 2020-11-15 (×42): qty 45

## 2020-11-15 MED ORDER — POTASSIUM CHLORIDE 20 MEQ/15ML (10%) PO SOLN
10.0000 meq | Freq: Every day | ORAL | Status: DC
  Administered 2020-11-15 – 2020-11-16 (×2): 10 meq
  Filled 2020-11-15 (×4): qty 15

## 2020-11-15 MED ORDER — OSMOLITE 1.2 CAL PO LIQD
1000.0000 mL | ORAL | Status: DC
  Administered 2020-11-15 – 2020-11-19 (×2): 1000 mL
  Filled 2020-11-15 (×5): qty 1000

## 2020-11-15 MED ORDER — DILTIAZEM HCL 30 MG PO TABS
30.0000 mg | ORAL_TABLET | Freq: Every day | ORAL | Status: DC | PRN
  Filled 2020-11-15 (×2): qty 1

## 2020-11-15 MED ORDER — AMIODARONE HCL IN DEXTROSE 360-4.14 MG/200ML-% IV SOLN
30.0000 mg/h | INTRAVENOUS | Status: DC
  Administered 2020-11-15 – 2020-11-16 (×2): 30 mg/h via INTRAVENOUS
  Filled 2020-11-15 (×2): qty 200

## 2020-11-15 MED ORDER — HYDRALAZINE HCL 25 MG PO TABS
25.0000 mg | ORAL_TABLET | Freq: Three times a day (TID) | ORAL | Status: DC | PRN
  Administered 2020-11-15 – 2020-11-21 (×2): 25 mg
  Filled 2020-11-15 (×2): qty 1

## 2020-11-15 MED ORDER — OSMOLITE 1.2 CAL PO LIQD: 1000.0000 mL | ORAL | Status: DC

## 2020-11-15 MED ORDER — DOFETILIDE 250 MCG PO CAPS: 250.0000 ug | ORAL_CAPSULE | Freq: Two times a day (BID) | ORAL | Status: DC

## 2020-11-15 MED ORDER — EZETIMIBE 10 MG PO TABS
10.0000 mg | ORAL_TABLET | Freq: Every day | ORAL | Status: DC
  Administered 2020-11-16 – 2020-12-06 (×21): 10 mg
  Filled 2020-11-15 (×21): qty 1

## 2020-11-15 MED ORDER — BUPROPION HCL ER (XL) 150 MG PO TB24
150.0000 mg | ORAL_TABLET | Freq: Every morning | ORAL | Status: DC
  Filled 2020-11-15: qty 1

## 2020-11-15 MED ORDER — HEPARIN (PORCINE) 25000 UT/250ML-% IV SOLN
1150.0000 [IU]/h | INTRAVENOUS | Status: AC
  Administered 2020-11-15: 600 [IU]/h via INTRAVENOUS
  Administered 2020-11-16: 950 [IU]/h via INTRAVENOUS
  Administered 2020-11-17 (×2): 1150 [IU]/h via INTRAVENOUS
  Filled 2020-11-15 (×5): qty 250

## 2020-11-15 MED ORDER — AMIODARONE LOAD VIA INFUSION
150.0000 mg | Freq: Once | INTRAVENOUS | Status: AC
  Administered 2020-11-15: 150 mg via INTRAVENOUS
  Filled 2020-11-15: qty 83.34

## 2020-11-15 MED ORDER — HYDRALAZINE HCL 25 MG PO TABS: 25.0000 mg | ORAL_TABLET | Freq: Three times a day (TID) | ORAL | Status: DC | PRN

## 2020-11-15 MED ORDER — EZETIMIBE 10 MG PO TABS: 10.0000 mg | ORAL_TABLET | Freq: Every day | ORAL | Status: DC

## 2020-11-15 MED ORDER — HYDRALAZINE HCL 20 MG/ML IJ SOLN
20.0000 mg | Freq: Four times a day (QID) | INTRAMUSCULAR | Status: DC | PRN
  Administered 2020-11-18 – 2020-12-01 (×8): 20 mg via INTRAVENOUS
  Filled 2020-11-15 (×9): qty 1

## 2020-11-15 MED ORDER — NEBIVOLOL HCL 10 MG PO TABS
10.0000 mg | ORAL_TABLET | Freq: Every day | ORAL | Status: DC
  Administered 2020-11-15 – 2020-11-16 (×2): 10 mg
  Filled 2020-11-15 (×3): qty 1

## 2020-11-15 MED ORDER — DILTIAZEM HCL 30 MG PO TABS
30.0000 mg | ORAL_TABLET | Freq: Every day | ORAL | Status: DC | PRN
  Filled 2020-11-15: qty 1

## 2020-11-15 MED ORDER — FLUTICASONE-UMECLIDIN-VILANT 100-62.5-25 MCG/INH IN AEPB: 1.0000 | INHALATION_SPRAY | Freq: Every day | RESPIRATORY_TRACT | Status: DC

## 2020-11-15 NOTE — Progress Notes (Signed)
Initial Nutrition Assessment  DOCUMENTATION CODES:   Severe malnutrition in context of chronic illness  INTERVENTION:   Initiate tube feeding via Cortrak tube: Osmolite 1.2 at 25 ml/h and increase by 10 ml every 8 hours to goal rate of 55 ml/h (1320 ml per day) Prosource TF 45 ml BID  Provides 1664 kcal, 95 gm protein, 1070 ml free water daily   NUTRITION DIAGNOSIS:   Severe Malnutrition related to chronic illness (COPD) as evidenced by severe muscle depletion, severe fat depletion.  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    Pt discussed during ICU rounds and with RN.  Pt unable to answer any questions.   8/2 s/p thrombectomy with complete revascularization   8/3 cortrak placed; tip gastric   Medications reviewed and include: SSI Labs reviewed:  CBG's: 111-130  NUTRITION - FOCUSED PHYSICAL EXAM:  Flowsheet Row Most Recent Value  Orbital Region Severe depletion  Upper Arm Region Severe depletion  Thoracic and Lumbar Region Severe depletion  Buccal Region Moderate depletion  Temple Region Mild depletion  Clavicle Bone Region Severe depletion  Clavicle and Acromion Bone Region Severe depletion  Scapular Bone Region Severe depletion  Dorsal Hand No depletion  Patellar Region Severe depletion  Anterior Thigh Region Severe depletion  Posterior Calf Region Severe depletion  Edema (RD Assessment) None  Hair Reviewed  Eyes Unable to assess  Mouth Unable to assess  Skin Reviewed  Nails Reviewed       Diet Order:   Diet Order             Diet NPO time specified  Diet effective now                    EDUCATION NEEDS:   Not appropriate for education at this time  Skin:  Skin Assessment: Reviewed RN Assessment  Last BM:  unknown  Height:   Ht Readings from Last 1 Encounters:  11/13/20 5\' 3"  (1.6 m)    Weight:   Wt Readings from Last 1 Encounters:  11/13/20 51.4 kg    BMI:  Body mass index is 20.07 kg/m.  Estimated Nutritional Needs:   Kcal:  1600-1800  Protein:  85-100 grams  Fluid:  >1.6 L/day  01/13/21., RD, LDN, CNSC See AMiON for contact information

## 2020-11-15 NOTE — Evaluation (Signed)
Occupational Therapy Evaluation Patient Details Name: Beverly Li MRN: [id number] DOB: 01/17/1946 Today's Date: 11/15/2020    History of Present Illness 75 y.o. female who presented 11/13/20 with R sided weakness and L gaze preference. Outside window for tPA. 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 S/p thrombectomy with TICI 3 revascularization 8/1. ETT 8/1-8/2. 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 PMH: AFib on Eliquis (off x 1 week), COPD, remote tobacco abuse, hypertrophic cardiomyopathy, aortic stenosis, PAD, anxiety, arthritis, Bell's palsy, HTN, osteopenia, and GERD   Clinical Impression   PT admitted with L MCA with revascularization. Pt currently with functional limitiations due to the deficits listed below (see OT problem list). Pt currently requires total +2 max (A) for bed mobility. Pt does not follow any commands. Pt moving hand in response to questions but as if attempting to guess a motion to questions. Pt does make voice sounds clearing throat x2 during session.  Pt will benefit from skilled OT to increase their independence and safety with adls and balance to allow discharge CIR.     Follow Up Recommendations  CIR    Equipment Recommendations  3 in 1 bedside commode;Wheelchair (measurements OT);Wheelchair cushion (measurements OT);Hospital bed    Recommendations for Other Services Rehab consult     Precautions / Restrictions Precautions Precautions: Fall Restrictions Weight Bearing Restrictions: No      Mobility Bed Mobility Overal bed mobility: Needs Assistance Bed Mobility: Supine to Sit;Sit to Supine     Supine to sit: Total assist;+2 for physical assistance;+2 for safety/equipment;HOB elevated Sit to supine: Total assist;+2 for physical assistance;+2 for  safety/equipment;HOB elevated   General bed mobility comments: Multi-modal cues provided but pt not following any. Used bed pad to assist with transition supine <> sit EOB with TAx2.    Transfers                 General transfer comment: Deferred due to pt not following commands this date.    Balance Overall balance assessment: Needs assistance Sitting-balance support: Single extremity supported;No upper extremity supported;Feet supported Sitting balance-Leahy Scale: Poor Sitting balance - Comments: Pt with R trunk rotation and lateral flexion with head slightly rotated to L at rest, needing physical assistance and R elbow placed on folded pillow to improve posture. Pt extending L elbow on bed, placed her hand on her lap. Mod-TA posterior support provided, no righting reactions noted. Postural control: Posterior lean;Right lateral lean     Standing balance comment: Unable this date.                           ADL either performed or assessed with clinical judgement   ADL Overall ADL's : Needs assistance/impaired                                       General ADL Comments: total (A) for all adls at this time     Vision   Additional Comments: pt does not track. pt L gaze preference. pt with neck rotation to R initially on arrival but with supine to sit looking L.     Perception     Praxis      Pertinent  Vitals/Pain Pain Assessment: Faces Faces Pain Scale: Hurts a little bit Pain Location: R leg pulled away with noxious stimuli Pain Descriptors / Indicators: Guarding Pain Intervention(s): Monitored during session;Repositioned     Hand Dominance Right   Extremity/Trunk Assessment Upper Extremity Assessment Upper Extremity Assessment: RUE deficits/detail RUE Deficits / Details: flaccid no activation noted. no subluxation at this time. no response to pain   Lower Extremity Assessment Lower Extremity Assessment: RLE deficits/detail;Defer  to PT evaluation RLE Deficits / Details: No active movement except to pull away from painful stimuli distally RLE Coordination: decreased fine motor;decreased gross motor LLE Deficits / Details: Moves L lower extremity spontaneously, performing x2 LAQ after max tactile and verbal cues to initiate them LLE Coordination: decreased gross motor   Cervical / Trunk Assessment Cervical / Trunk Assessment: Kyphotic;Other exceptions Cervical / Trunk Exceptions: Sits with trunk rotated and laterally flexed to R   Communication Communication Communication: Receptive difficulties;Expressive difficulties   Cognition Arousal/Alertness: Lethargic Behavior During Therapy: Flat affect Overall Cognitive Status: Impaired/Different from baseline Area of Impairment: Attention;Following commands;Safety/judgement;Awareness;Problem solving                   Current Attention Level: Focused   Following Commands:  (not following multi-modal cues) Safety/Judgement: Decreased awareness of safety;Decreased awareness of deficits Awareness: Intellectual Problem Solving: Decreased initiation;Requires verbal cues;Requires tactile cues;Difficulty sequencing;Slow processing General Comments: Pt lethargic, sleeping upon arrival, but able to awaken and keep awake with stimuli. Pt automatically opening mouth when flash light shined at it 1x, but  did not repeated with subsequent attempts. Made eye contact with light from flashlight 1x also. Not following multi-modal max cues and not tracking therapists, maintaining a L gaze preference.   General Comments  Bruising noted throughout body; HR 110s upon entry pt at rest, up to 128 bpm sitting EOB; BP with slight SBP decline sitting up EOB but returned to prior normal range with extended sitting EOB    Exercises     Shoulder Instructions      Home Living Family/patient expects to be discharged to:: Private residence Living Arrangements: Spouse/significant  other Available Help at Discharge: Family;Available 24 hours/day Type of Home: House Home Access: Stairs to enter [employer] of Steps: 1 Entrance Stairs-Rails: None Home Layout: Two level;Able to live on main level with bedroom/bathroom (can transition sunroom into bedroom downstairs)     Bathroom Shower/Tub: Chief Strategy Officer: Standard     Home Equipment: Toilet riser;Walker - 2 wheels;Other (comment);Shower seat;Grab bars - tub/shower;Wheelchair - manual Museum/gallery conservator)   Additional Comments: has 2nd house on [address] [place], [place]      Prior Functioning/Environment Level of Independence: Independent        Comments: Pt was able to perform all ADLs, mobility, and manage finances/meds without assistance.        OT Problem List: Decreased strength;Decreased range of motion;Decreased activity tolerance;Impaired balance (sitting and/or standing);Impaired vision/perception;Decreased coordination;Decreased cognition;Decreased safety awareness;Decreased knowledge of use of DME or AE;Decreased knowledge of precautions;Cardiopulmonary status limiting activity;Impaired UE functional use      OT Treatment/Interventions: Self-care/ADL training;Therapeutic exercise;Neuromuscular education;Energy conservation;DME and/or AE instruction;Manual therapy;Modalities;Therapeutic activities;Cognitive remediation/compensation;Visual/perceptual remediation/compensation;Patient/family education;Balance training    OT Goals(Current goals can be found in the care plan section) Acute Rehab OT Goals Patient Stated Goal: none stated OT Goal Formulation: Patient unable to participate in goal setting Time For Goal Achievement: 11/29/20 Potential to Achieve Goals: Fair  OT Frequency: Min 2X/week   Barriers to D/C:  Co-evaluation PT/OT/SLP Co-Evaluation/Treatment: Yes Reason for Co-Treatment: Complexity of the patient's impairments (multi-system involvement);Necessary to  address cognition/behavior during functional activity;For patient/therapist safety;To address functional/ADL transfers PT goals addressed during session: Mobility/safety with mobility;Balance OT goals addressed during session: ADL's and self-care;Proper use of Adaptive equipment and DME;Strengthening/ROM      AM-PAC OT "6 Clicks" Daily Activity     Outcome Measure Help from another person eating meals?: Total Help from another person taking care of personal grooming?: Total Help from another person toileting, which includes using toliet, bedpan, or urinal?: Total Help from another person bathing (including washing, rinsing, drying)?: Total Help from another person to put on and taking off regular upper body clothing?: Total Help from another person to put on and taking off regular lower body clothing?: Total 6 Click Score: 6   End of Session Nurse Communication: Mobility status;Precautions  Activity Tolerance: Patient tolerated treatment well Patient left: in bed;with call bell/phone within reach;with bed alarm set  OT Visit Diagnosis: Unsteadiness on feet (R26.81);Muscle weakness (generalized) (M62.81);Hemiplegia and hemiparesis Hemiplegia - Right/Left: Right Hemiplegia - caused by: Cerebral infarction                Time: 6384-6659 OT Time Calculation (min): 19 min Charges:  OT General Charges $OT Visit: 1 Visit OT Evaluation $OT Eval Moderate Complexity: 1 Mod   Brynn, OTR/L  Acute Rehabilitation Services Pager: [phone] Office: [phone] .   Mateo Flow 11/15/2020, 4:25 PM

## 2020-11-15 NOTE — Progress Notes (Signed)
ANTICOAGULATION CONSULT NOTE - Initial Consult  Pharmacy Consult for heparin Indication: atrial fibrillation  Allergies  Allergen Reactions   Amoxicillin Other (See Comments)    UTI Has patient had a PCN reaction causing immediate rash, facial/tongue/throat swelling, SOB or lightheadedness with hypotension: No Has patient had a PCN reaction causing severe rash involving mucus membranes or skin necrosis: No Has patient had a PCN reaction that required hospitalization: No Has patient had a PCN reaction occurring within the last 10 years: Yes--UTI ONLY If all of the above answers are "NO", then may proceed with Cephalosporin use.    Atenolol Cough   Crestor [Rosuvastatin Calcium] Other (See Comments)    Muscle aches   Other Other (See Comments)   Pravastatin Other (See Comments)    Muscle aches   Sulfa Antibiotics Nausea Only   Codeine     hallucinations    Patient Measurements: Height: 5\' 3"  (160 cm) Weight: 51.4 kg (113 lb 5.1 oz) IBW/kg (Calculated) : 52.4 Heparin Dosing Weight: 51 kg   Vital Signs: Temp: 100.8 F (38.2 C) (08/03 0800) Temp Source: Axillary (08/03 0800) BP: 136/89 (08/03 0733) Pulse Rate: 112 (08/03 0733)  Labs: Recent Labs    11/13/20 1345 11/13/20 1351 11/13/20 1742 11/13/20 2142 11/14/20 0237 11/15/20 0303  HGB 14.1   < > 13.9  --  14.4 13.4  HCT 44.2   < > 41.0  --  44.1 42.6  PLT 240  --   --   --  246 190  APTT 27  --   --   --   --   --   LABPROT 12.9  --   --   --  12.3 12.6  INR 1.0  --   --   --  0.9 1.0  CREATININE 0.77   < >  --  0.82 0.90 0.80   < > = values in this interval not displayed.    Estimated Creatinine Clearance: 49.3 mL/min (by C-G formula based on SCr of 0.8 mg/dL).   Medical History: Past Medical History:  Diagnosis Date   Anxiety    Arthritis    "some in my lower back; probably elbows, knees" (11/18/2017)   Atrial fibrillation (HCC)    Bell's palsy    when pt. was 75 yrs old, when under stress the left  side of face will droop.   Complication of anesthesia    "vascular OR 2016; BP bottomed out; couldn't get it regulated; ended up in ICU for DAYS" (11/18/2017)   GERD (gastroesophageal reflux disease)    Heart murmur    History of kidney stones    Hypertension    Hypertrophic cardiomyopathy (HCC)    severe LV basilar hypertrophy witn no evidence of significant outflow tract obstruction, EF 65-70%, mild LAE, mild TR, grade 1a diastolic dysfunction 05/15/10 (Dr. 05/17/10) (Atrial Septal Hypertrophy pattern)-- Intra-op TEE with dsignificant outflow tract obstruction - AI, MR & TR   Insomnia    Mild aortic sclerosis    Osteopenia    Peripheral vascular disease (HCC)    Syncope    , Vagal    Medications:  Scheduled:   arformoterol  15 mcg Nebulization BID   Chlorhexidine Gluconate Cloth  6 each Topical Daily   insulin aspart  0-9 Units Subcutaneous Q4H   mouth rinse  15 mL Mouth Rinse BID   pantoprazole (PROTONIX) IV  40 mg Intravenous QHS   revefenacin  175 mcg Nebulization Daily    Assessment: 75 YOF with  h/o Afib on Eliquis PTA who presented with an MCA occlusion and is s/p successful mechanical thrombectomy. Of note, patient's Eliquis was on hold ~ 1 week prior to admission due to recent hematoma. Pharmacy consulted to start IV heparin for AFib.   H/H and Plt wnl, SCr wnl.   Goal of Therapy:  Heparin level 0.3-0.5 units/ml Monitor platelets by anticoagulation protocol: Yes   Plan:  -Start heparin infusion at 600 units/hr. No bolus -F/u 6 hr HL -Monitor daily HL, CBC and s/s of bleeding   Vinnie Level, PharmD., BCPS, BCCCP Clinical Pharmacist Please refer to [facility] for unit-specific pharmacist

## 2020-11-15 NOTE — Telephone Encounter (Signed)
This RN c/b to speak with Selena Batten regarding pt's current medications.  Advised pt has been seen at another facility since the last time she was here and medications could have been changed/discontinued there.  Reviewed meds as we have listed and included d/c tikosyn per phone note by Alejandro Mulling, PA on 11/08/2020.    Selena Batten states understanding and thanked me for the call back and information.

## 2020-11-15 NOTE — Progress Notes (Signed)
STROKE TEAM PROGRESS NOTE    Interval History  No acute events overnight, .  She was extubated yesterday afternoon and so far seems to be breathing quite well.  However she remains globally aphasic and is unable to mouth words and follow commands and continues to have dense right hemiplegia and purposeful left-sided movements.  She has not had any further involuntary movements in the right side after receiving 1 mg of Ativan yesterday.  She went into atrial fibrillation with rapid heart rate and is currently on amiodarone drip.  She is also on Cleviprex drip for blood pressure control.  Her husband and son are at the bedside  Pertinent Lab Work and Imaging    11/13/20 CT Head WO IV Contrast 1. Mild asymmetric hypodensity of the left insula, concerning for acute left MCA territory infarct. An MRI could further evaluate. 2. Hyperdense appearance of the distal left M1 MCA, concerning for thrombus. Recommend CTA to further evaluate. 3. No acute hemorrhage. 4. Remote lacunar infarcts versus dilated perivascular spaces in bilateral inferior basal ganglia. Suspected remote lacunar infarct in the right cerebellum.  11/13/20 CT Angio Head and Neck W WO IV Contrast 1. Occlusion of the left M1 MCA with very little opacification of distal left MCA branches. Perfusion demonstrates the core infarct of 41 mL in the anterior left MCA territory with large (122 ml) of ischemic phenomena in the remaining left MCA and potentially left ACA territories. 2. Age indeterminate occlusion of the proximal right internal carotid artery in the neck with non-opacification through the carotid terminus. The right MCA and right posterior communicating artery are opacified via the of the right A1 ACA. 3. Suspected high-grade stenosis versus occlusion of the distal left A3/A4 ACA. 4. Approximately 40-50% stenosis of the proximal left ICA in the neck. 5. Severe stenosis of the non dominant/small right vertebral artery origin.  Moderate stenosis of the intradural left vertebral artery. 6. Severe emphysema.  11/14/20 MRI Brain WO IV Contrast 1. 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 Associated edema without mass effect. 2. Additional scattered punctate acute infarcts in the right frontoparietal cortex and small acute infarct in the right cerebellum.  11/14/20 Echocardiogram Complete   1. There is no evidence of systolic anterior motion of the mitral valve or true LV outflow obstruction on this study. LVOT velocities are mildly elevated at 2 m/s. Left ventricular ejection fraction, by estimation, is 60 to 65%. The left ventricle has low normal function. The left ventricle has no regional wall motion abnormalities. There is moderate concentric left ventricular hypertrophy. Left ventricular diastolic parameters are consistent with Grade II diastolic dysfunction (pseudonormalization). Elevated left atrial pressure.   2. Right ventricular systolic function is normal. The right ventricular size is normal.   3. Left atrial size was severely dilated.   4. Right atrial size was mildly dilated.   5. The mitral valve is degenerative. Mild to moderate mitral valve regurgitation.   6. The aortic valve is tricuspid. There is moderate calcification of the aortic valve. There is mild thickening of the aortic valve. Aortic valve regurgitation is mild. Mild to moderate aortic valve  sclerosis/calcification is present, without any evidence of aortic stenosis.   7. The inferior vena cava is dilated in size with <50% respiratory variability, suggesting right atrial pressure of 15 mmHg.   Physical Examination   Constitutional: Elderly Caucasian female resting in bed, intubated and sedated  Cardiovascular: Normal RR Respiratory: No increased WOB   Mental status: Unable to  answer LOC questions due to intubation/aphasia, does not follow commands opens eyes but is aphasic.   Speech:  UTA  Cranial nerves: EOMI with skewed  gaze( right eye is p slightly hypertropic, Decreased BTT on the right, UTA face symmetry due to ET tube, UTA tongue due to lack of command following  Motor: Normal bulk and tone. RUE plegic, RLE withdraws subtly to noxious stimulation. LUE antigravity, she is able to maintain her left leg up when it is flexed in the bed by the examiner  Sensory: Decreased sensation to the right  Coordination: UTA  Gait: Deferred due to right sided weakness   Assessment and Plan   Ms. LEVITA MONICAL is a 75 y.o. female w/pmh of AFib on Eliquis (off x 1 week), COPD, remote tobacco abuse, hypertrophic cardiomyopathy, aortic stenosis, PAD, anxiety, and GERD who presents with right sided weakness, right facial droop, and left gaze preference. She did not qualify for IVTPA but underwent thrombectomy with TICI 3 revascularization   NEURO # LMCA Stroke status post mechanical thrombectomy with TICI 3 revascularization in the setting of atrial fibrillation( taken off anticoagulation due to hematoma)   Patient presented with the symptoms described above. At this time, her stroke work up is complete. CTH w/L MCA stroke. MRI Brain revealed left MCA and ACA stroke. CTA Head and Neck showed occlusion of the left MCA and an age indeterminate occlusion of the proximal right internal carotid artery in the neck with non-opacification through the carotid terminus. Echo w/EF 60 to 65 %, left atrium severely dilated. Stroke labs w/LDL 120 + hemoglobin a1c 6.0. Stroke etiology is atrial fibrillation off of anticoagulation due to hematoma.  - Holding ASA due to hematoma  - Atorvastatin 40 for stroke prevention when NGT is placed or she passes swallow eval  - Will place referral at discharge for stroke follow up   CARDS #Hypertension She has a history of HTN and atrial fibrillation; she takes Diltiazem 30 mg QD, Nebivolol 10 mg QD and Hydralazine 25 mg Q8 at home. Currently blood pressure is normotensive. Are managing blood pressure with  Cleviprex at this time.  - SBP goal 120-140 given thrombectomy   #Hyperlipidemia From a stroke prevention stand point, the LDL goal is < 70. LDL is 120, will start Atorvastatin 40 mg when enteral access is obtained.   RESP #Hypoxic Respiratory Failure  #History of COPD  Was intubated due to respiratory distress, extubated on 11/14/20. Extubated to 4L [place].  - CCM managing   RENAL Trending lytes and renal function, BUN elevated are trending   GI Currently NPO, will likely need NGT and possibly peg tube  ENDO #Stroke Diabetes Screening  Hemoglobin A1C this admission noted to be 6, prediabetic range. Managing hyperglycemia with SSI this admission.   HEME #Hematoma  Hemoglobin hematocrit and platelet count stable. She was recently hospitalized for a hematoma that developed due to coughing + Eliquis use.  ID Afebrile. No infectious processes at this time.   Hospital day # 2    Patient presented with left M1 occlusion outside tPA window and was treated with successful mechanical thrombectomy with good recanalization but MRI shows left frontal and some left ACA infarcts and clinical exam demonstrated significant aphasia with right hemiparesis.  .She seems to have tolerated extubation well since yesterday.  Plan to start IV heparin given she has atrial fibrillation and the stroke occurred when anticoagulation was heldfor abdominal wall bruising.  Continue mobilize out of bed and ongoing therapy consults.  Rehab consult.  Patient is unlikely to be able to swallow anytime soon hence recommend core track tube for feeding.  Resume home rate control medications and taper off amiodarone drip.  Start oral blood pressure medication and taper Cleviprex drip.  Discussed with Dr. Merrily Pew critical care medicine.  Long discussion with the patient's husband and son at the bedside and answered questions about her prognosis, evaluation and treatment plan and discussion with care team. This patient is critically  ill and at significant risk of neurological worsening, death and care requires constant monitoring of vital signs, hemodynamics,respiratory and cardiac monitoring, extensive review of multiple databases, frequent neurological assessment, discussion with family, other specialists and medical decision making of high complexity.I have made any additions or clarifications directly to the above note.This critical care time does not reflect procedure time, or teaching time or supervisory time of PA/NP/Med Resident etc but could involve care discussion time.  I spent 35 minutes of neurocritical care time  in the care of  this patient.      Delia Heady, MD Medical Director [facility] St[facility] Pager: [phone] 11/15/2020 4:09 PM    To contact Stroke Continuity provider, please refer to [email or web address]. After hours, contact General Neurology

## 2020-11-15 NOTE — Progress Notes (Signed)
Rehab Admissions Coordinator Note:  Patient was screened by Clois Dupes for appropriateness for an Inpatient Acute Rehab Consult per therapy recs. Patient currently not at a level to pursue Cir. We await improved participation demonstration with therapy. I will not place a rehab consult at this time but we will follow her progress.  Clois Dupes RN MSN 11/15/2020, 5:30 PM  I can be reached at [phone].

## 2020-11-15 NOTE — Progress Notes (Signed)
ANTICOAGULATION CONSULT NOTE - Follow-Up Consult  Pharmacy Consult for heparin Indication: atrial fibrillation  Patient Measurements: Height: 5\' 3"  (160 cm) Weight: 51.4 kg (113 lb 5.1 oz) IBW/kg (Calculated) : 52.4 Heparin Dosing Weight: 51 kg   Vital Signs: Temp: 99.6 F (37.6 C) (08/03 1600) Temp Source: Axillary (08/03 1600) BP: 151/104 (08/03 1700) Pulse Rate: 108 (08/03 1700)  Labs: Recent Labs    11/13/20 1345 11/13/20 1351 11/13/20 1742 11/13/20 2142 11/14/20 0237 11/15/20 0303 11/15/20 1633  HGB 14.1   < > 13.9  --  14.4 13.4  --   HCT 44.2   < > 41.0  --  44.1 42.6  --   PLT 240  --   --   --  246 190  --   APTT 27  --   --   --   --   --   --   LABPROT 12.9  --   --   --  12.3 12.6  --   INR 1.0  --   --   --  0.9 1.0  --   HEPARINUNFRC  --   --   --   --   --   --  0.12*  CREATININE 0.77   < >  --  0.82 0.90 0.80  --    < > = values in this interval not displayed.     Estimated Creatinine Clearance: 49.3 mL/min (by C-G formula based on SCr of 0.8 mg/dL).   Medications:  Scheduled:   arformoterol  15 mcg Nebulization BID   budesonide (PULMICORT) nebulizer solution  0.25 mg Nebulization BID   [START ON 11/16/2020] buPROPion  150 mg Oral q morning   Chlorhexidine Gluconate Cloth  6 each Topical Daily   ezetimibe  10 mg Oral Daily   feeding supplement (PROSource TF)  45 mL Per Tube BID   insulin aspart  0-9 Units Subcutaneous Q4H   mouth rinse  15 mL Mouth Rinse BID   nebivolol  10 mg Oral Daily   pantoprazole (PROTONIX) IV  40 mg Intravenous QHS   potassium chloride  10 mEq Oral Daily   revefenacin  175 mcg Nebulization Daily    Assessment: 22 YOF with h/o Afib on Eliquis PTA who presented with an MCA occlusion and is s/p successful mechanical thrombectomy. Of note, patient's Eliquis was on hold ~ 1 week prior to admission due to recent hematoma. Pharmacy consulted to start IV heparin for AFib.   Heparin level this evening is SUBtherapeutic (HL  0.12, goal of 0.3-0.5). The patient's IV was leaking around 1200 today shortly after initiation and the site was changed out  - level still likely on the lower end so will plan to increase. No bleeding noted per RN.   Goal of Therapy:  Heparin level 0.3-0.5 units/ml Monitor platelets by anticoagulation protocol: Yes   Plan:  - Increase Heparin to 700 units/hr (7 ml/hr) - Will continue to monitor for any signs/symptoms of bleeding and will follow up with heparin level in 8 hours  Thank you for allowing pharmacy to be a part of this patient's care.  61, PharmD, BCPS Clinical Pharmacist Clinical phone for 11/15/2020: [phone] 11/15/2020 5:53 PM   **Pharmacist phone directory can now be found on [email or web address] ([email or web address]).  Listed under [facility].

## 2020-11-15 NOTE — Procedures (Signed)
Cortrak  Person Inserting Tube:  Tandre Conly, RD Tube Type:  Cortrak - 43 inches Tube Size:  10 Tube Location:  Right nare Initial Placement:  Stomach Secured by: Bridle Technique Used to Measure Tube Placement:  Marking at nare/corner of mouth Cortrak Secured At:  67 cm  Cortrak Tube Team Note:  Consult received to place a Cortrak feeding tube.   X-ray is required, abdominal x-ray has been ordered by the Cortrak team. Please confirm tube placement before using the Cortrak tube.   If the tube becomes dislodged please keep the tube and contact the Cortrak team at [email or web address] ([email or web address]) for replacement.  If after hours and replacement cannot be delayed, place a NG tube and confirm placement with an abdominal x-ray.    Vanessa Kick MS, RD, LDN, CNSC Clinical Nutrition Pager listed in AMION

## 2020-11-15 NOTE — Progress Notes (Signed)
eLink Physician-Brief Progress Note Patient Name: Beverly Li DOB: 1945-11-27 MRN: [id number]   Date of Service  11/15/2020  HPI/Events of Note  AFIB with RVR - Ventricular rate = 135. BP = 127/82. Reluctant to anticoagulate d/t concern for converting recent large L MCA territory ischemic stroke to a hemorrhagic stroke. Patient is on Cleviprex IV infusion for HTN.  eICU Interventions  Plan: Amiodarone IV load and infusion. Send BMP ordered for AM now. Mg++ level STAT.     Intervention Category Major Interventions: Arrhythmia - evaluation and management  Chanele Douglas Eugene 11/15/2020, 3:29 AM

## 2020-11-15 NOTE — Progress Notes (Signed)
NAME:  Beverly Li, MRN:  [id number], DOB:  January 29, 1946, LOS: 2 ADMISSION DATE:  11/13/2020, CONSULTATION DATE:  11/13/2020 REFERRING MD:  Derry Lory CHIEF COMPLAINT:  AMS   History of Present Illness:  75 year old woman with PMHx significant for Afib (on Eliquis, held x 1 week), AS, COPD, PAD, anxiety, GERD who presented to [facility] 8/1 with right-sided weakness, right facial droop and left gaze preference.    Of note, patient's AC recently held due to hospitalization for a large hematoma 2/2 significant coughing as a result of pneumonia. She was recently admitted to [facility] in [place]. [place] and while hospitalized went into Afib with RVR, kept for several days to manage. No medications were changed at discharge. She was advised to follow up with cardiology for AF management and when to restart anticoagulation.  CT Head on arrival to [facility] demonstrated signs of L MCA occlusion; patient was subsequently intubated for AMS and airway protection.  Taken to [facility] for thrombectomy with TICI 3 flow after 1 pass to L MCA M1 seg. Brought to ICU  on vent, Cleviprex and Propofol gtts.   PCCM consulted for vent and ICU BP management.  Pertinent Medical History:  Aortic stenosis Hypertrophic cardiomyopathy Afib on Tikosyn, AC Recent hospitalization for ?hematoma unclear origin PVD COPD no PFTs on file  Significant Hospital Events: Including procedures, antibiotic start and stop dates in addition to other pertinent events   8/1 admitted, intubated, thrombectomy 8/2 Successful thrombectomy with complete revasculatization, TICI 3 flow. MRI with L ACA/MCA territory infarcts and multiple small punctate infarcts. Extubated late in day to [place]. 8/3 Afib on tele. Amio load + gtt started. Cortrak placement.  Interim History / Subjective:  Appears more comfortable this AM Moving LUE/LLE spontaneously, some purposeful movement (rubbing son's hand) Opens eyes to command/voice, not reliably following  any other commands for me Extubated yesterday to [place] without issue In Afib this morning, rates low 100s s/p amio load + gtt Cortrak requested today for enteral access SLP to see today Husband/son updated at bedside  Objective   Blood pressure 136/89, pulse (!) 112, temperature (!) 100.8 F (38.2 C), temperature source Axillary, resp. rate 20, height 5\' 3"  (1.6 m), weight 51.4 kg, SpO2 94 %.    Vent Mode: CPAP;PSV FiO2 (%):  [36 %-40 %] 36 % Set Rate:  [14 bmp] 14 bmp PEEP:  [5 cmH20] 5 cmH20   Intake/Output Summary (Last 24 hours) at 11/15/2020 0920 Last data filed at 11/15/2020 0700 Gross per 24 hour  Intake 1742.55 ml  Output 1450 ml  Net 292.55 ml    Filed Weights   11/13/20 1300  Weight: 51.4 kg   Physical Examination: General: Acutely ill-appearing elderly woman in NAD. HEENT: [employer]/AT, anicteric sclera, PERRL, moist mucous membranes. Neuro: Awake, intermittently dozes off. Unable to assess orientation. Responds to verbal stimuli. Not following commands. Unilateral R-sided neglect noted. Flicker noted on RLE. +Cough and +Gag  CV: Irregularly irregular rhythm.  PULM: Breathing even and unlabored on 4LNC. Lung fields CTAB. GI: Soft, nontender, nondistended. Normoactive bowel sounds. Extremities: Trace LE edema noted. Skin: Warm/dry, extensive ecchymosis to anterior lower abdomen/pelvic region.  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 CT Head demonstrating c/f L MCA territory infarct. Taken to [facility] for intervention; successful complete revascularization with 1 pass, TICI 3 flow. MRI 8/2 demonstrating L ACA/MCA territory  infarct and small punctate infarcts. Etiology of stroke most likely Afib off of AC due to hematoma. - Management per Stroke team, NIR - Goal SBP 120-140 - Cleviprex titrated to SBP goal - MRI as above -  Neuroprotective measures: HOB > 30 degrees, normoglycemia, normothermia, electrolytes WNL - Possible statin start per Stroke team, though patient has reported adverse effects (muscle aches) with statins previously (pravastatin, rosuvastatin) - AC resumption per Stroke/NIR  Acute hypoxemic respiratory failure in setting of large left MCA stroke History of COPD, not in flare Extubated 8/2. - Wean supplemental O2 for sat > 90% - Continue Roxy Manns - Pulmonary hygiene  Hypertensive emergency - Cleviprex gtt as above - Goal SBP 120-140 per Stroke/NIR  Afib previously on [facility] Hypertrophic cardiomyopathy Recent hematoma of unclear etiology requiring AC cessation History of Afib. Recently hospitalized for PNA, Afib with RVR. AC recently held x 1 week due to hematoma sustained 2/2 ?profuse coughing. Echo 8/2 with EF 60-65% - Amiodarone load + gtt as above - Plan to resume home medications once enteral access established - AC resumption per Stroke team/NIR  GERD - Continue PPI  At risk for malnutrition Need for enteral access - Cortrak tube requested today, given possibility of prolonged NPO status - SLP eval today 8/3  Best Practice (right click and "Reselect all SmartList Selections" daily)   Diet/type: NPO DVT prophylaxis: SCD GI prophylaxis: PPI Lines: N/A Foley:  N/A Code Status:  full code Last date of multidisciplinary goals of care discussion [Per primary]  Critical care time: 35 minutes   Tim Lair, PA-C [facility] 11/15/20 9:20 AM  Please see [email or web address] for pager details.  From 7A-7P if no response, please call [phone] After hours, please call ELink [phone]

## 2020-11-15 NOTE — Evaluation (Signed)
Clinical/Bedside Swallow Evaluation Patient Details  Name: Beverly Li MRN: [id number] Date of Birth: Sep 12, 1945  Today's Date: 11/15/2020 Time: SLP Start Time (ACUTE ONLY): 1030 SLP Stop Time (ACUTE ONLY): 1100 SLP Time Calculation (min) (ACUTE ONLY): 30 min  Past Medical History:  Past Medical History:  Diagnosis Date   Anxiety    Arthritis    "some in my lower back; probably elbows, knees" (11/18/2017)   Atrial fibrillation (HCC)    Bell's palsy    when pt. was 75 yrs old, when under stress the left side of face will droop.   Complication of anesthesia    "vascular OR 2016; BP bottomed out; couldn't get it regulated; ended up in ICU for DAYS" (11/18/2017)   GERD (gastroesophageal reflux disease)    Heart murmur    History of kidney stones    Hypertension    Hypertrophic cardiomyopathy (HCC)    severe LV basilar hypertrophy witn no evidence of significant outflow tract obstruction, EF 65-70%, mild LAE, mild TR, grade 1a diastolic dysfunction 05/15/10 (Dr. Donato Schultz) (Atrial Septal Hypertrophy pattern)-- Intra-op TEE with dsignificant outflow tract obstruction - AI, MR & TR   Insomnia    Mild aortic sclerosis    Osteopenia    Peripheral vascular disease (HCC)    Syncope    , Vagal   Past Surgical History:  Past Surgical History:  Procedure Laterality Date   AUGMENTATION MAMMAPLASTY Bilateral    BACK SURGERY     CARDIAC CATHETERIZATION N/A 05/07/2016   Procedure: Left Heart Cath and Coronary Angiography;  Surgeon: Kathleene Hazel, MD;  Location: [facility] INVASIVE CV LAB;  Service: Cardiovascular;  Laterality: N/A;   CARDIOVERSION N/A 09/24/2017   Procedure: CARDIOVERSION;  Surgeon: Laurey Morale, MD;  Location: [facility] ENDOSCOPY;  Service: Cardiovascular;  Laterality: N/A;   DILATION AND CURETTAGE OF UTERUS     ENDARTERECTOMY FEMORAL Right 03/02/2015   Procedure: ENDARTERECTOMY RIGHT FEMORAL;  Surgeon: Chuck Hint, MD;  Location: [facility] OR;  Service: Vascular;   Laterality: Right;   FACIAL COSMETIC SURGERY Left 2002   "related to Bell's Palsy @ age 64; left eye/side of face droopy; tried to make area symmetrical"   FEMORAL-POPLITEAL BYPASS GRAFT Right 03/02/2015   Procedure: BYPASS GRAFT FEMORAL-BELOW KNEE POPLITEAL ARTERY;  Surgeon: Chuck Hint, MD;  Location: [facility] OR;  Service: Vascular;  Laterality: Right;   INGUINAL HERNIA REPAIR Bilateral 2002   IR CT HEAD LTD  11/13/2020   IR PERCUTANEOUS ART THROMBECTOMY/INFUSION INTRACRANIAL INC DIAG ANGIO  11/13/2020   OVARIAN CYST REMOVAL Left    PERIPHERAL VASCULAR CATHETERIZATION N/A 01/16/2015   Procedure: Abdominal Aortogram;  Surgeon: Chuck Hint, MD;  Location: [facility] INVASIVE CV LAB;  Service: Cardiovascular;  Laterality: N/A;   POSTERIOR LUMBAR FUSION  2015   "have plates and screws in there"   RADIOLOGY WITH ANESTHESIA N/A 11/13/2020   Procedure: IR WITH ANESTHESIA;  Surgeon: Radiologist, Medication, MD;  Location: [facility] OR;  Service: Radiology;  Laterality: N/A;   TONSILLECTOMY     HPI:  75 year old female with proximal A. 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 Intubated 8/1, extubated 8/2. MRI shows Acute infarcts involving the left basal ganglia, left insula, overlying  left frontal lobe and high left parasagittal frontal lobe cortex,  compatible with left ACA and MCA territory infarcts. Small areas of  acute infarct in the left frontal lobe white matter and punctate  left parietal cortical infarct, also left ACA and MCA territories.  Associated  edema without mass effect. Additional punctate acute  infarcts in right frontal and parietal cortex. Small acute right  cerebellar infarct.   Assessment / Plan / Recommendation Clinical Impression  Pt demonstrates severe dysphagia with overt CN VII weakness. Pt is noted to attempt to swallow secretions, but ongoing throat clearing and gurgling suggests ineffective swallowing. When presented with PO with multimodal cueing pt makes no  movement for oral manipulation. Hopeful for some short term improvement if arousal improves, but impairment may be persistently severe. WIll follow for attempts to progress pt. SLP Visit Diagnosis: Dysphagia, oropharyngeal phase (R13.12)    Aspiration Risk  Severe aspiration risk    Diet Recommendation NPO;Alternative means - temporary   Medication Administration: Via alternative means    Other  Recommendations Oral Care Recommendations: Oral care QID Other Recommendations: Have oral suction available   Follow up Recommendations Inpatient Rehab      Frequency and Duration min 2x/week  2 weeks       Prognosis Prognosis for Safe Diet Advancement: Guarded Barriers to Reach Goals: Language deficits;Severity of deficits      Swallow Study   General HPI: 75 year old female with proximal A. 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 Intubated 8/1, extubated 8/2. MRI shows Acute infarcts involving the left basal ganglia, left insula, overlying  left frontal lobe and high left parasagittal frontal lobe cortex,  compatible with left ACA and MCA territory infarcts. Small areas of  acute infarct in the left frontal lobe white matter and punctate  left parietal cortical infarct, also left ACA and MCA territories.  Associated edema without mass effect. Additional punctate acute  infarcts in right frontal and parietal cortex. Small acute right  cerebellar infarct. Type of Study: Bedside Swallow Evaluation Diet Prior to this Study: NPO Temperature Spikes Noted: No Respiratory Status: Room air History of Recent Intubation: Yes Length of Intubations (days): 2 days Date extubated: 11/14/20 Behavior/Cognition: Lethargic/Drowsy Oral Cavity Assessment: Excessive secretions Oral Care Completed by SLP: Yes Oral Cavity - Dentition: Adequate natural dentition Self-Feeding Abilities: Total assist Patient Positioning: Upright in bed Baseline Vocal Quality: Normal (heard in yawning,  coughing) Volitional Cough: Cognitively unable to elicit Volitional Swallow: Unable to elicit    Oral/Motor/Sensory Function Overall Oral Motor/Sensory Function: Within functional limits   Ice Chips Ice chips: Impaired Oral Phase Impairments: Poor awareness of bolus;Reduced labial seal;Reduced lingual movement/coordination;Impaired mastication Oral Phase Functional Implications: Oral residue;Oral holding;Prolonged oral transit   Thin Liquid Thin Liquid: Not tested    Nectar Thick Nectar Thick Liquid: Not tested   Honey Thick Honey Thick Liquid: Not tested   Puree Puree: Not tested   Solid     Solid: Not tested      Caprina Wussow, Riley Nearing 11/15/2020,2:12 PM

## 2020-11-15 NOTE — Evaluation (Signed)
Physical Therapy Evaluation Patient Details Name: Beverly Li MRN: [id number] DOB: 1945/06/24 Today's Date: 11/15/2020   History of Present Illness  Pt is a 75 y.o. female who presented 11/13/20 with R sided weakness and L gaze preference. Outside window for tPA. 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 S/p thrombectomy with TICI 3 revascularization 8/1. ETT 8/1-8/2. 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 PMH: AFib on Eliquis (off x 1 week), COPD, remote tobacco abuse, hypertrophic cardiomyopathy, aortic stenosis, PAD, anxiety, arthritis, Bell's palsy, HTN, osteopenia, and GERD   Clinical Impression  Pt presents with condition above and deficits mentioned below, see PT Problem List. PTA, she was independent, living with her husband in a 2-level house with 1 STE. Pt's husband reports they can convert a room on the main level into a bedroom and there is a full bathroom already present on the main level. Currently, pt is not communicating or following any commands. She maintains a L gaze preference and is not tracking at this time. In addition, she displays significant R-sided weakness, balance deficits, and cognitive deficits resulting in her requiring TA x2 for all bed mobility and mod-TA for support to sit statically at EOB this date. Will continue to follow acutely. Due to significant change in functional status and great social support, recommending CIR consult.    Follow Up Recommendations CIR;Supervision/Assistance - 24 hour    Equipment Recommendations  3in1 (PT);Hospital bed    Recommendations for Other Services Rehab consult     Precautions / Restrictions Precautions Precautions: Fall Restrictions Weight Bearing Restrictions: No      Mobility  Bed Mobility Overal bed mobility: Needs  Assistance Bed Mobility: Supine to Sit;Sit to Supine     Supine to sit: Total assist;+2 for physical assistance;+2 for safety/equipment;HOB elevated Sit to supine: Total assist;+2 for physical assistance;+2 for safety/equipment;HOB elevated   General bed mobility comments: Multi-modal cues provided but pt not following any. Used bed pad to assist with transition supine <> sit EOB with TAx2.    Transfers                 General transfer comment: Deferred due to pt not following commands this date.  Ambulation/Gait             General Gait Details: Unable this date.  Stairs            Wheelchair Mobility    Modified Rankin (Stroke Patients Only) Modified Rankin (Stroke Patients Only) Pre-Morbid Rankin Score: No symptoms Modified Rankin: Severe disability     Balance Overall balance assessment: Needs assistance Sitting-balance support: Single extremity supported;No upper extremity supported;Feet supported Sitting balance-Leahy Scale: Poor Sitting balance - Comments: Pt with R trunk rotation and lateral flexion with head slightly rotated to L at rest, needing physical assistance and R elbow placed on folded pillow to improve posture. Pt extending L elbow on bed, placed her hand on her lap. Mod-TA posterior support provided, no righting reactions noted. Postural control: Posterior lean;Right lateral lean     Standing balance comment: Unable this date.                             Pertinent Vitals/Pain Pain Assessment: Faces Faces Pain Scale: Hurts a little bit Pain Location: R  leg pulled away with noxious stimuli Pain Descriptors / Indicators: Guarding Pain Intervention(s): Limited activity within patient's tolerance;Monitored during session;Repositioned    Home Living Family/patient expects to be discharged to:: Private residence Living Arrangements: Spouse/significant other Available Help at Discharge: Family;Available 24 hours/day Type of  Home: House Home Access: Stairs to enter Entrance Stairs-Rails: None Entrance Stairs-Number of Steps: 1 Home Layout: Two level;Able to live on main level with bedroom/bathroom (can transition sunroom into bedroom downstairs) Home Equipment: Toilet riser;Walker - 2 wheels;Other (comment);Shower seat;Grab bars - tub/shower;Wheelchair - manual Museum/gallery conservator) Additional Comments: has 2nd house on [address] [place], [place]    Prior Function Level of Independence: Independent         Comments: Pt was able to perform all ADLs, mobility, and manage finances/meds without assistance.     Hand Dominance   Dominant Hand: Right    Extremity/Trunk Assessment   Upper Extremity Assessment Upper Extremity Assessment: Defer to OT evaluation    Lower Extremity Assessment Lower Extremity Assessment: RLE deficits/detail;Generalized weakness RLE Deficits / Details: No active movement except to pull away from painful stimuli distally RLE Coordination: decreased fine motor;decreased gross motor LLE Deficits / Details: Moves L lower extremity spontaneously, performing x2 LAQ after max tactile and verbal cues to initiate them LLE Coordination: decreased gross motor    Cervical / Trunk Assessment Cervical / Trunk Assessment: Kyphotic;Other exceptions Cervical / Trunk Exceptions: Sits with trunk rotated and laterally flexed to R  Communication   Communication: Receptive difficulties;Expressive difficulties  Cognition Arousal/Alertness: Lethargic Behavior During Therapy: Flat affect Overall Cognitive Status: Impaired/Different from baseline Area of Impairment: Attention;Following commands;Safety/judgement;Awareness;Problem solving                   Current Attention Level: Focused   Following Commands:  (not following multi-modal cues) Safety/Judgement: Decreased awareness of safety;Decreased awareness of deficits Awareness: Intellectual Problem Solving: Decreased initiation;Requires verbal  cues;Requires tactile cues;Difficulty sequencing;Slow processing General Comments: Pt lethargic, sleeping upon arrival, but able to awaken and keep awake with stimuli. Pt automatically opening mouth when flash light shined at it 1x, but  did not repeated with subsequent attempts. Made eye contact with light from flashlight 1x also. Not following multi-modal max cues and not tracking therapists, maintaining a L gaze preference.      General Comments General comments (skin integrity, edema, etc.): Bruising noted throughout body; HR 110s upon entry pt at rest, up to 128 bpm sitting EOB; BP with slight SBP decline sitting up EOB but returned to prior normal range with extended sitting EOB    Exercises     Assessment/Plan    PT Assessment Patient needs continued PT services  PT Problem List Decreased strength;Decreased activity tolerance;Decreased balance;Decreased mobility;Decreased coordination;Decreased cognition;Decreased knowledge of use of DME;Decreased safety awareness;Impaired sensation       PT Treatment Interventions DME instruction;Gait training;Stair training;Therapeutic activities;Functional mobility training;Therapeutic exercise;Balance training;Neuromuscular re-education;Cognitive remediation;Patient/family education;Wheelchair mobility training    PT Goals (Current goals can be found in the Care Plan section)  Acute Rehab PT Goals Patient Stated Goal: did not state; husband desired for pt to get whatever care she needs to improve and return home PT Goal Formulation: Patient unable to participate in goal setting Time For Goal Achievement: 11/29/20 Potential to Achieve Goals: Fair    Frequency Min 4X/week   Barriers to discharge        Co-evaluation PT/OT/SLP Co-Evaluation/Treatment: Yes Reason for Co-Treatment: Complexity of the patient's impairments (multi-system involvement);Necessary to address cognition/behavior during functional activity;For patient/therapist  safety;To  address functional/ADL transfers PT goals addressed during session: Mobility/safety with mobility;Balance         AM-PAC PT "6 Clicks" Mobility  Outcome Measure Help needed turning from your back to your side while in a flat bed without using bedrails?: Total Help needed moving from lying on your back to sitting on the side of a flat bed without using bedrails?: Total Help needed moving to and from a bed to a chair (including a wheelchair)?: Total Help needed standing up from a chair using your arms (e.g., wheelchair or bedside chair)?: Total Help needed to walk in hospital room?: Total Help needed climbing 3-5 steps with a railing? : Total 6 Click Score: 6    End of Session   Activity Tolerance: Patient tolerated treatment well Patient left: in bed;with call bell/phone within reach;with bed alarm set Nurse Communication: Mobility status PT Visit Diagnosis: Muscle weakness (generalized) (M62.81);Difficulty in walking, not elsewhere classified (R26.2);Other symptoms and signs involving the nervous system (R29.898);Hemiplegia and hemiparesis Hemiplegia - Right/Left: Right Hemiplegia - dominant/non-dominant: Dominant Hemiplegia - caused by: Cerebral infarction    Time: 8937-3428 PT Time Calculation (min) (ACUTE ONLY): 19 min   Charges:   PT Evaluation $PT Eval Moderate Complexity: 1 Mod          Raymond Gurney, PT, DPT Acute Rehabilitation Services  Pager: [phone] Office: [phone]   Jewel Baize 11/15/2020, 4:06 PM

## 2020-11-15 NOTE — Progress Notes (Signed)
OT Cancellation Note  Patient Details Name: Beverly Li MRN: [id number] DOB: [birth year]   Cancelled Treatment:    Reason Eval/Treat Not Completed: Other (comment) (attempting cortrak so OT to check back at more appropriate time)  Mateo Flow 11/15/2020, 1:11 PM

## 2020-11-15 NOTE — Evaluation (Signed)
Speech Language Pathology Evaluation Patient Details Name: Beverly Li MRN: [id number] DOB: 11-17-1945 Today's Date: 11/15/2020 Time: 1030-1100 SLP Time Calculation (min) (ACUTE ONLY): 30 min  Problem List:  Patient Active Problem List   Diagnosis Date Noted   Stroke [facility]) 11/13/2020   Middle cerebral artery embolism, left 11/13/2020   Healthcare maintenance 04/27/2020   Sinus bradycardia 12/25/2019   COPD (chronic obstructive pulmonary disease) (HCC) 12/16/2018   Cough 12/16/2018   Unintentional weight loss 10/05/2018   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   Visit for monitoring Tikosyn therapy 11/18/2017   Closed nondisplaced fracture of head of radius with routine healing 03/03/2017   Coronary artery disease involving native coronary artery of native heart without angina pectoris    Chronic diastolic heart failure (HCC) 03/28/2015   Paroxysmal atrial fibrillation (HCC)    Essential hypertension    S/P femoral-popliteal bypass surgery    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   PAD (peripheral artery disease) (HCC) 03/02/2015   Claudication (HCC) 01/31/2015   Former smoker 01/31/2015   Hyperlipidemia 01/31/2015   Atherosclerosis of native arteries of extremity with intermittent claudication (HCC) 02/10/2014   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   Atrial arrhythmia    Mild aortic sclerosis    Past Medical History:  Past Medical History:  Diagnosis Date   Anxiety    Arthritis    "some in my lower back; probably elbows, knees" (11/18/2017)   Atrial fibrillation (HCC)    Bell's palsy    when pt. was 75 yrs old, when under stress the left side of face will droop.   Complication of anesthesia    "vascular OR 2016; BP bottomed out; couldn't get it regulated; ended up in ICU for DAYS" (11/18/2017)   GERD (gastroesophageal reflux disease)    Heart murmur    History of kidney stones    Hypertension    Hypertrophic cardiomyopathy (HCC)     severe LV basilar hypertrophy witn no evidence of significant outflow tract obstruction, EF 65-70%, mild LAE, mild TR, grade 1a diastolic dysfunction 05/15/10 (Dr. Donato Schultz) (Atrial Septal Hypertrophy pattern)-- Intra-op TEE with dsignificant outflow tract obstruction - AI, MR & TR   Insomnia    Mild aortic sclerosis    Osteopenia    Peripheral vascular disease (HCC)    Syncope    , Vagal   Past Surgical History:  Past Surgical History:  Procedure Laterality Date   AUGMENTATION MAMMAPLASTY Bilateral    BACK SURGERY     CARDIAC CATHETERIZATION N/A 05/07/2016   Procedure: Left Heart Cath and Coronary Angiography;  Surgeon: Kathleene Hazel, MD;  Location: [facility] INVASIVE CV LAB;  Service: Cardiovascular;  Laterality: N/A;   CARDIOVERSION N/A 09/24/2017   Procedure: CARDIOVERSION;  Surgeon: Laurey Morale, MD;  Location: [facility] ENDOSCOPY;  Service: Cardiovascular;  Laterality: N/A;   DILATION AND CURETTAGE OF UTERUS     ENDARTERECTOMY FEMORAL Right 03/02/2015   Procedure: ENDARTERECTOMY RIGHT FEMORAL;  Surgeon: Chuck Hint, MD;  Location: [facility] OR;  Service: Vascular;  Laterality: Right;   FACIAL COSMETIC SURGERY Left 2002   "related to Bell's Palsy @ age 64; left eye/side of face droopy; tried to make area symmetrical"   FEMORAL-POPLITEAL BYPASS GRAFT Right 03/02/2015   Procedure: BYPASS GRAFT FEMORAL-BELOW KNEE POPLITEAL ARTERY;  Surgeon: Chuck Hint, MD;  Location[facility] OR;  Service: Vascular;  Laterality: Right;   INGUINAL HERNIA REPAIR Bilateral 2002   IR CT HEAD LTD  11/13/2020   IR PERCUTANEOUS ART THROMBECTOMY/INFUSION  INTRACRANIAL INC DIAG ANGIO  11/13/2020   OVARIAN CYST REMOVAL Left    PERIPHERAL VASCULAR CATHETERIZATION N/A 01/16/2015   Procedure: Abdominal Aortogram;  Surgeon: Chuck Hint, MD;  Location: [facility] INVASIVE CV LAB;  Service: Cardiovascular;  Laterality: N/A;   POSTERIOR LUMBAR FUSION  2015   "have plates and screws in there"   RADIOLOGY WITH  ANESTHESIA N/A 11/13/2020   Procedure: IR WITH ANESTHESIA;  Surgeon: Radiologist, Medication, MD;  Location: [facility] OR;  Service: Radiology;  Laterality: N/A;   TONSILLECTOMY     HPI:  75 year old female with proximal A. 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 Intubated 8/1, extubated 8/2. MRI shows Acute infarcts involving the left basal ganglia, left insula, overlying  left frontal lobe and high left parasagittal frontal lobe cortex,  compatible with left ACA and MCA territory infarcts. Small areas of  acute infarct in the left frontal lobe white matter and punctate  left parietal cortical infarct, also left ACA and MCA territories.  Associated edema without mass effect. Additional punctate acute  infarcts in right frontal and parietal cortex. Small acute right  cerebellar infarct.   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 Pt using left UE in gentle exploration of environment, response to touch of therapist and care givers. Unable to follow commands with hand, needs total assist to use objects functionally to wipe face etc. Pt likely to have significant dysarthria as well given overt labial weakness and minimal lingual movement. Will continue efforts. Provided introductory education to husband and son. Recommend CIR    SLP Assessment  SLP Visit Diagnosis: Dysphagia, oropharyngeal phase (R13.12)    Follow Up Recommendations  Inpatient Rehab    Frequency and Duration min 2x/week         SLP Evaluation Cognition  Overall Cognitive Status: Impaired/Different from baseline Arousal/Alertness: Lethargic Orientation Level: Other (comment) Attention: Focused Focused Attention: Impaired Focused Attention Impairment: Verbal basic;Functional basic       Comprehension  Auditory Comprehension Overall Auditory  Comprehension: Impaired Yes/No Questions: Impaired Basic Biographical Questions: 0-25% accurate Commands: Impaired One Step Basic Commands: 0-24% accurate Reading Comprehension Reading Status: Not tested    Expression Verbal Expression Overall Verbal Expression: Impaired Written Expression Dominant Hand: Right   Oral / Motor  Oral Motor/Sensory Function Overall Oral Motor/Sensory Function: Within functional limits Motor Speech Overall Motor Speech: Impaired   GO                    Nykole Matos, Riley Nearing 11/15/2020, 2:17 PM

## 2020-11-15 NOTE — Telephone Encounter (Signed)
   Pt c/o medication issue:  1. Name of Medication: heart meds  2. How are you currently taking this medication (dosage and times per day)?   3. Are you having a reaction (difficulty breathing--STAT)?   4. What is your medication issue? Kim at [facility] pharmacy need to speak with a nurse to clarify pt's heart meds, pt is in the hospital and cant talk and pt's family is getting upset. She ask if nurse can call her back as soon as possible

## 2020-11-15 NOTE — Progress Notes (Signed)
Referring Physician(s): Code Stroke  Supervising Physician: Julieanne Cotton  Patient Status:  [facility] - In-pt  Chief Complaint:  Code Stroke on 8/1 S/p bilateral common carotid arteriogram via Lt CFA approach, s/p complete revascularization of occluded Lt MCA M 1 seg achieving a TICI 3 revascularization with Dr. Corliss Skains on 8/1  Subjective:  Patient laying in bed, not in acute distress. Son and husband at the bedside. Patient is getting speech evaluation.  She was able to be extubated yesterday.  Son states that patient had purposeful movement on the left UE, still minimal movement on right UE/LE.    Allergies: Amoxicillin, Atenolol, Crestor [rosuvastatin calcium], Other, Pravastatin, Sulfa antibiotics, and Codeine  Medications: Prior to Admission medications   Medication Sig Start Date End Date Taking? Authorizing Provider  acetaminophen (TYLENOL) 500 MG tablet Take 1,500 mg by mouth every 6 (six) hours as needed (for pain.).    [provider]  alendronate (FOSAMAX) 70 MG tablet Take 70 mg by mouth every Monday. Take with a full glass of water on an empty stomach.    [provider]  azithromycin (ZITHROMAX) 250 MG tablet Take 2 tablets PO on day one, and one tablet PO daily thereafter until completed. 10/17/20   [name], PA-C  benzonatate (TESSALON) 100 MG capsule Take 1 capsule (100 mg total) by mouth 3 (three) times daily as needed. 10/17/20   [name], PA-C  buPROPion (WELLBUTRIN XL) 150 MG 24 hr tablet Take 150 mg by mouth every morning.     [provider]  Calcium-Phosphorus-Vitamin D (CALCIUM/D3 ADULT GUMMIES PO) Take 2 tablets by mouth daily at 2 PM.    [provider]  DEXILANT 30 MG capsule Take 1 capsule by mouth daily. 12/21/19   [provider]  diltiazem (CARDIZEM) 30 MG tablet Take 1 tablet (30 mg total) by mouth daily as needed (blood pressure higher than 160). 07/02/19   [name], MD  ELIQUIS  5 MG TABS tablet TAKE 1 TABLET(5 MG) BY MOUTH TWICE DAILY 10/24/20   [name], MD  ezetimibe (ZETIA) 10 MG tablet TAKE 1 TABLET(10 MG) BY MOUTH DAILY 03/02/20   [name], MD  hydrALAZINE (APRESOLINE) 25 MG tablet TAKE 1 TABLET BY MOUTH EVERY 8 HOURS AS NEEDED FOR SYSTOLIC BLOOD PRESSURE GREATER THAN 160 07/02/19   Duke Salvia, MD  midodrine (PROAMATINE) 10 MG tablet TAKE 1 TABLET BY MOUTH THREE TIMES DAILY WITH MEALS AS NEEDED FOR BLOD PRESSURE LOWER THAN 100(SYSTOLIC) 12/01/19   [name], MD  Multiple Vitamins-Iron (STRESS B COMPLEX/IRON) TABS Take 1 tablet by mouth at bedtime.     [provider]  nebivolol (BYSTOLIC) 10 MG tablet Take 1 tablet (10 mg total) by mouth daily. 09/12/20   [name], MD  potassium chloride (KLOR-CON) 10 MEQ tablet TAKE 1 TABLET(10 MEQ) BY MOUTH DAILY 05/12/20   Duke Salvia, MD  predniSONE (STERAPRED UNI-PAK 21 TAB) 10 MG (21) TBPK tablet 6 day taper; take as directed on package instructions 10/17/20   Reine Just  [facility] HFA 108 (90 Base) MCG/ACT inhaler Inhale 2 puffs into the lungs 4 (four) times daily as needed (for wheezing/shortness of breath.).  03/03/17   [provider]  TRELEGY ELLIPTA 100-62.5-25 MCG/INH AEPB Take 1 puff by mouth daily. 08/10/20   [provider]  zolpidem (AMBIEN) 10 MG tablet Take 10 mg by mouth at bedtime as needed. 03/10/19   [provider]  Vital Signs: BP 136/89   Pulse (!) 112   Temp (!) 100.8 F (38.2 C) (Axillary)   Resp 20   Ht  (1.6 m)   Wt 113 lb 5.1 oz (51.4 kg)   LMP  (LMP Unknown)   SpO2 94%   BMI 20.07 kg/m   Physical Exam Vitals reviewed.  Constitutional:      General: She is not in acute distress.    Appearance: She is not ill-appearing.  HENT:     Head: Normocephalic and atraumatic.  Cardiovascular:     Comments: Bilateral DP palpable. Pulmonary:     Effort: Pulmonary effort is normal.  Skin:    General: Skin is warm and  dry.     Comments: Ecchymosis noted on suprapubic area and left groin area.   L CAF puncture site is unremarkable with no erythema, edema, tenderness, bleeding or drainage. Site is soft to touch.   Neurological:     Mental Status: She is disoriented.     Comments: Not able to follow command.  Able to move left UE    Imaging: DG Chest 1 View  Result Date: 11/14/2020 CLINICAL DATA:  Respiratory failure, endotracheal intubation EXAM: CHEST  1 VIEW COMPARISON:  11/13/2020 FINDINGS: Endotracheal tube seen 6.8 cm above the carina. Nasogastric tube extends into the upper abdomen beyond the margin of the examination. Pulmonary insufflation is normal and symmetric. Chronic interstitial changes are noted at the lung bases bilaterally. No superimposed confluent pulmonary infiltrate. No pneumothorax or pleural effusion. Prominent skin fold noted at the right apex. Capsular calcification related to right breast implant is again noted. Mild cardiomegaly is stable. Pulmonary vascularity is normal. IMPRESSION: Support tubes in appropriate position. Preserved pulmonary insufflation. Stable cardiomegaly. Electronically Signed   By: Helyn Numbers MD   On: 11/14/2020 04:40   DG Chest 1 View  Result Date: 11/13/2020 CLINICAL DATA:  Intubation EXAM: CHEST  1 VIEW COMPARISON:  04/27/2020 x-ray, MRI 09/01/2018 FINDINGS: Endotracheal tube terminates approximately 4.8 cm above the carina. Mild cardiomegaly. Atherosclerotic calcification of the aortic knob. Diffusely increased interstitial markings within the lung bases. Area of lucency at the left costophrenic angle likely reflecting a skin fold. A small pneumothorax would be difficult to exclude. Osseous structures are demineralized. Chronic vertebral body height loss at approximately T7 compatible with site of prior discitis-osteomyelitis. IMPRESSION: 1. Endotracheal tube terminates approximately 4.8 cm above the carina. 2. Diffusely increased interstitial markings within  the lung bases may reflect edema versus infection. 3. Area of relative lucency at the left costophrenic angle likely reflecting a skin fold. A small pneumothorax would be difficult to exclude. Recommend repeat chest x-ray after patient positioning, preferably upright. Electronically Signed   By: Duanne Guess D.O.   On: 11/13/2020 16:54   MR BRAIN WO CONTRAST  Result Date: 11/14/2020 CLINICAL DATA:  Neuro deficit, acute stroke suspected. EXAM: MRI HEAD WITHOUT CONTRAST TECHNIQUE: Multiplanar, multiecho pulse sequences of the brain and surrounding structures were obtained without intravenous contrast. COMPARISON:  CT exams from yesterday. FINDINGS: Motion limited study.  Within this limitation: Brain: Acute infarcts involving the left basal ganglia (including the caudate and subinsular white matter), left insula, overlying left frontal lobe and high left parasagittal frontal lobe cortex, compatible with left ACA and MCA territory infarcts. Small areas of acute infarct in the left frontal lobe white matter and punctate left parietal cortical infarct, also left ACA and MCA territories. Associated edema without mass effect. Additional punctate acute infarcts in right frontal and  parietal cortex. Small acute right cerebellar infarct. No hydrocephalus. No evidence of acute hemorrhage, mass lesion, midline shift. Remote right cerebellar lacunar infarct. Additional mild scattered T2 hyperintensities within the white matter, likely related to chronic microvascular ischemic disease. Vascular: Better evaluated on recent catheter arteriogram and CTA. Skull and upper cervical spine: Normal marrow signal. Sinuses/Orbits: Clear sinuses.  Unremarkable orbits. Other: No sizable mastoid effusions. IMPRESSION: Motion limited study. 1. 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 Associated edema without mass effect. 2. Additional scattered punctate acute infarcts in the right frontoparietal cortex and small  acute infarct in the right cerebellum. Electronically Signed   By: Feliberto Harts MD   On: 11/14/2020 11:34   IR CT Head Ltd  Result Date: 11/15/2020 INDICATION: New onset left gaze deviation, right-sided hemiplegia, and global aphasia. 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 EXAM: 1. EMERGENT LARGE VESSEL OCCLUSION THROMBOLYSIS (anterior CIRCULATION) COMPARISON:  CT angiogram of the head and neck of November 13, 2020. MEDICATIONS: Ancef 2 g IV antibiotic was administered within 1 hour of the procedure. ANESTHESIA/SEDATION: General anesthesia. CONTRAST:  Omnipaque 300 approximately 60 mL. FLUOROSCOPY TIME:  Fluoroscopy Time: 27 minutes 54 seconds (628 mGy). COMPLICATIONS: None immediate. TECHNIQUE: Following a full explanation of the procedure along with the potential associated complications, an informed witnessed consent was obtained. The risks of intracranial hemorrhage of 10%, worsening neurological deficit, ventilator dependency,death and inability to revascularize were all reviewed in detail with the patient's husband. The patient was then put under general anesthesia by the Department of Anesthesiology at [facility]. The left groin was prepped and draped in the usual sterile fashion. Thereafter using modified Seldinger technique, transfemoral access into the left common femoral artery was obtained without difficulty. Over a 0.035 inch guidewire an 8 [place] Pinnacle 10 cm sheath was inserted. Through this, and also over a 0.035 inch guidewire a a 5.5 [place] 125 cm Berenstein support catheter inside of an 087 balloon guide catheter which had been prepped with 50% contrast and 50% heparinized saline infusion was advanced to the aortic arch region, and selectively positioned in the left common carotid artery just proximal to the left common carotid bifurcation. The support catheter, and the guidewire were removed. Good aspiration obtained from the hub of the balloon guide catheter.  Arteriograms were then performed centered extra cranially and intracranially. FINDINGS: The left common carotid arteriogram demonstrates the left external carotid artery and its major branches to be widely patent. The left internal carotid artery at the bulb has mild to moderate stenosis due to calcified arteriosclerotic plaque. No evidence of intraluminal filling defects or of ulcerations was evident. More distally the left internal carotid artery is seen to ascend to the cranial skull base. The petrous, cavernous and the supraclinoid segments are widely patent. The left anterior cerebral artery opacifies into the capillary and venous phases. Prompt opacification via the anterior communicating artery of the right anterior cerebral artery and the right middle cerebral artery is seen into the capillary and venous phases. The left middle cerebral artery demonstrates complete occlusion in the mid M1 segment. PROCEDURE: Over a 0.014 inch standard Synchro micro guidewire with a J-tip configuration, a combination of an 071 32 cm Zoom aspiration catheter with the balloon guide catheter were advanced to the supraclinoid left ICA. The balloon guide was situated at the cervical petrous junction. Using a torque device, access was obtained into the M2 M3 region with the micro guidewire, followed by the microcatheter. The guidewire was removed.  Poor aspiration obtained from the hub of the microcatheter. This was then retrieved more proximally. 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 The micro guidewire and the microcatheter were retrieved more proximally. Using constant aspiration with Penumbra aspiration device at the hub of the Zoom aspiration catheter, and proximal flow arrest in the left internal carotid artery, the Zoom aspiration catheter was advanced more distally into the dominant inferior division branch. There it was maintained for approximately 2  minutes. Thereafter, the Zoom aspiration catheter was gently retrieved and removed. Aspiration was continued with a 20 mL syringe at the hub of the balloon guide catheter. Following flow reversal, a control arteriogram performed through the balloon guide catheter in the proximal left internal carotid artery demonstrated complete revascularization of the left middle cerebral artery into the capillary and venous phases. The distal MCA branches were widely patent without evidence of intraluminal filling defects. The left anterior cerebral artery demonstrated wide patency with no change in the contralateral right anterior circulation via the anterior communicating artery. The balloon guide catheter was then removed. The right common carotid artery was then selected over an 0.035 inch Roadrunner guidewire with a 5 [place] diagnostic catheter. An arteriogram performed demonstrated severe stenosis at the origin of the right external carotid artery though its branches appeared widely patent. An eccentric smooth plaque was seen projecting in the right common carotid artery proximal to the bifurcation due to an anteriorly positioned plaque. Right internal carotid artery at the bulb is patent with delayed ascent of contrast to the cranial skull base consistent with angiographic string sign. CT of the brain demonstrated no evidence of intracranial hemorrhage or mass effect. The left femoral sheath was removed with hemostasis achieved with manual compression. Distal pulses remained palpable in both feet unchanged at the end of the procedure. Patient was left intubated due to patient being unresponsive due to the stroke, and to protect the airway. Patient was then transferred to PACU and then neuro ICU for post revascularization management. 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 PLAN: Follow-up in the clinic 4 weeks post discharge. Electronically Signed   By: Julieanne Cotton M.D.   On: 11/14/2020 10:13   DG CHEST PORT 1 VIEW  Result Date: 11/13/2020 CLINICAL DATA:  Intubated, enteric catheter placement, CVA EXAM: PORTABLE CHEST 1 VIEW COMPARISON:  The 122 at 4:42 p.m. FINDINGS: 2 supine frontal views of the chest are obtained. Endotracheal tube overlies tracheal air column, tip just below thoracic inlet. Enteric catheter passes below diaphragm tip excluded by collimation. Cardiac silhouette is enlarged but stable. Chronic central vascular congestion without airspace disease, effusion, or pneumothorax. Prominent skin fold overlies the right apex. IMPRESSION: 1. Support devices as above. 2. Chronic vascular congestion.  No acute airspace disease. Electronically Signed   By: Sharlet Salina M.D.   On: 11/13/2020 18:34   DG Abd Portable 1V  Result Date: 11/13/2020 CLINICAL DATA:  Enteric catheter placement EXAM: PORTABLE ABDOMEN - 1 VIEW COMPARISON:  03/08/2015 FINDINGS: Frontal view of the abdomen and pelvis was obtained, excluding the lower pelvis by collimation. Enteric catheter passes below diaphragm, tip projecting over region of the gastric antrum. Bowel gas pattern is unremarkable. Excreted contrast within the kidneys from preceding interventional  procedure. IMPRESSION: 1. Enteric catheter tip projecting over the gastric antrum. Electronically Signed   By: Sharlet Salina M.D.   On: 11/13/2020 18:32   ECHOCARDIOGRAM COMPLETE  Result Date: 11/14/2020    ECHOCARDIOGRAM REPORT   Patient Name:   Beverly Li Michl Date of Exam: 11/14/2020 Medical Rec #:  [id number]       Height:       63.0 in Accession #:    [id number]      Weight:       113.3 lb Date of Birth:  04-18-1945       BSA:          1.519 m Patient Age:    75 years        BP:           121/72 mmHg Patient Gender: F                HR:           61 bpm. Exam Location:  Inpatient Procedure: 2D Echo, Cardiac Doppler, Color Doppler and Strain Analysis Indications:    Stroke  History:        Patient has prior history of Echocardiogram examinations, most                 recent 12/29/2018. CAD; Risk Factors:Hypertension, Dyslipidemia                 and Former Smoker. PAD. Hypertrophic cardiomyopathy.  Sonographer:    Ross Ludwig RDCS (AE) Referring Phys: [id number] [facility]  Sonographer Comments: Global longitudinal strain was attempted. IMPRESSIONS  1. There is no evidence of systolic anterior motion of the mitral valve or true LV outflow obstruction on this study. LVOT velocities are mildly elevated at 2 m/s. Left ventricular ejection fraction, by estimation, is 60 to 65%. The left ventricle has low normal function. The left ventricle has no regional wall motion abnormalities. There is moderate concentric left ventricular hypertrophy. Left ventricular diastolic parameters are consistent with Grade II diastolic dysfunction (pseudonormalization). Elevated left atrial pressure.  2. Right ventricular systolic function is normal. The right ventricular size is normal.  3. Left atrial size was severely dilated.  4. Right atrial size was mildly dilated.  5. The mitral valve is degenerative. Mild to moderate mitral valve regurgitation.  6. The aortic valve is tricuspid. There is moderate calcification of the aortic valve. There is mild thickening of the aortic valve. Aortic valve regurgitation is mild. Mild to moderate aortic valve sclerosis/calcification is present, without any evidence of aortic stenosis.  7. The inferior vena cava is dilated in size with <50% respiratory variability, suggesting right atrial pressure of 15 mmHg. Comparison(s): Prior images reviewed side by side. The left ventricular diastolic function is significantly worse. Elevated right and left atrial pressures on the current study. FINDINGS  Left Ventricle: There  is no evidence of systolic anterior motion of the mitral valve or true LV outflow obstruction on this study. LVOT velocities are mildly elevated at 2 m/s. Left ventricular ejection fraction, by estimation, is 60 to 65%. The left ventricle has low normal function. The left ventricle has no regional wall motion abnormalities. The left ventricular internal cavity size was normal in size. There is moderate concentric left ventricular hypertrophy. Left ventricular diastolic parameters are consistent with Grade II diastolic dysfunction (pseudonormalization). Elevated left atrial pressure. Right Ventricle: The right ventricular size is normal. No increase in right ventricular wall thickness. Right ventricular systolic function is normal. Left Atrium:  Left atrial size was severely dilated. Right Atrium: Right atrial size was mildly dilated. Pericardium: There is no evidence of pericardial effusion. Mitral Valve: The mitral valve is degenerative in appearance. There is mild thickening of the mitral valve leaflet(s). Mild to moderate mitral annular calcification. Mild to moderate mitral valve regurgitation, with centrally-directed jet. MV peak gradient, 12.1 mmHg. The mean mitral valve gradient is 4.0 mmHg. Tricuspid Valve: The tricuspid valve is normal in structure. Tricuspid valve regurgitation is not demonstrated. Aortic Valve: The aortic valve is tricuspid. There is moderate calcification of the aortic valve. There is mild thickening of the aortic valve. Aortic valve regurgitation is mild. Mild to moderate aortic valve sclerosis/calcification is present, without any evidence of aortic stenosis. Aortic valve mean gradient measures 7.0 mmHg. Aortic valve peak gradient measures 14.3 mmHg. Aortic valve area, by VTI measures 3.95 cm. Pulmonic Valve: The pulmonic valve was normal in structure. Pulmonic valve regurgitation is mild. Aorta: The aortic root and ascending aorta are structurally normal, with no evidence of  dilitation. Venous: The inferior vena cava is dilated in size with less than 50% respiratory variability, suggesting right atrial pressure of 15 mmHg. IAS/Shunts: No atrial level shunt detected by color flow Doppler.  LEFT VENTRICLE PLAX 2D LVIDd:         3.55 cm  Diastology LVIDs:         2.30 cm  LV e' medial:    3.92 cm/s LV PW:         1.42 cm  LV E/e' medial:  42.1 LV IVS:        1.34 cm  LV e' lateral:   5.87 cm/s LVOT diam:     2.14 cm  LV E/e' lateral: 28.1 LV SV:         144 LV SV Index:   95       2D Longitudinal Strain LVOT Area:     3.60 cm 2D Strain GLS Avg:     -16.7 %  RIGHT VENTRICLE             IVC RV Basal diam:  3.10 cm     IVC diam: 1.50 cm RV S prime:     14.40 cm/s TAPSE (M-mode): 2.3 cm LEFT ATRIUM              Index       RIGHT ATRIUM           Index LA diam:        4.90 cm  3.23 cm/m  RA Area:     19.70 cm LA Vol (A2C):   71.9 ml  47.34 ml/m RA Volume:   49.70 ml  32.72 ml/m LA Vol (A4C):   120.0 ml 79.01 ml/m LA Biplane Vol: 92.7 ml  61.03 ml/m  AORTIC VALVE AV Area (Vmax):    3.86 cm AV Area (Vmean):   3.95 cm AV Area (VTI):     3.95 cm AV Vmax:           189.00 cm/s AV Vmean:          124.000 cm/s AV VTI:            0.365 m AV Peak Grad:      14.3 mmHg AV Mean Grad:      7.0 mmHg LVOT Vmax:         203.00 cm/s LVOT Vmean:        136.000 cm/s LVOT VTI:  0.401 m LVOT/AV VTI ratio: 1.10  AORTA Ao Root diam: 3.40 cm MITRAL VALVE MV Area (PHT): 2.24 cm     SHUNTS MV Peak grad:  12.1 mmHg    Systemic VTI:  0.40 m MV Mean grad:  4.0 mmHg     Systemic Diam: 2.14 cm MV Vmax:       1.74 m/s MV Vmean:      90.1 cm/s MV Decel Time: 338 msec MV E velocity: 165.00 cm/s MV A velocity: 114.00 cm/s MV E/A ratio:  1.45 Mihai Croitoru MD Electronically signed by Thurmon Fair MD Signature Date/Time: 11/14/2020/12:03:41 PM    Final    IR PERCUTANEOUS ART THROMBECTOMY/INFUSION INTRACRANIAL INC DIAG ANGIO  Result Date: 11/15/2020 INDICATION: New onset left gaze deviation, right-sided  hemiplegia, and global aphasia. 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 EXAM: 1. EMERGENT LARGE VESSEL OCCLUSION THROMBOLYSIS (anterior CIRCULATION) COMPARISON:  CT angiogram of the head and neck of November 13, 2020. MEDICATIONS: Ancef 2 g IV antibiotic was administered within 1 hour of the procedure. ANESTHESIA/SEDATION: General anesthesia. CONTRAST:  Omnipaque 300 approximately 60 mL. FLUOROSCOPY TIME:  Fluoroscopy Time: 27 minutes 54 seconds (628 mGy). COMPLICATIONS: None immediate. TECHNIQUE: Following a full explanation of the procedure along with the potential associated complications, an informed witnessed consent was obtained. The risks of intracranial hemorrhage of 10%, worsening neurological deficit, ventilator dependency,death and inability to revascularize were all reviewed in detail with the patient's husband. The patient was then put under general anesthesia by the Department of Anesthesiology at [facility]. The left groin was prepped and draped in the usual sterile fashion. Thereafter using modified Seldinger technique, transfemoral access into the left common femoral artery was obtained without difficulty. Over a 0.035 inch guidewire an 8 [place] Pinnacle 10 cm sheath was inserted. Through this, and also over a 0.035 inch guidewire a a 5.5 [place] 125 cm Berenstein support catheter inside of an 087 balloon guide catheter which had been prepped with 50% contrast and 50% heparinized saline infusion was advanced to the aortic arch region, and selectively positioned in the left common carotid artery just proximal to the left common carotid bifurcation. The support catheter, and the guidewire were removed. Good aspiration obtained from the hub of the balloon guide catheter. Arteriograms were then performed centered extra cranially and intracranially. FINDINGS: The left common carotid arteriogram demonstrates the left external carotid artery and its major branches to be widely patent. The  left internal carotid artery at the bulb has mild to moderate stenosis due to calcified arteriosclerotic plaque. No evidence of intraluminal filling defects or of ulcerations was evident. More distally the left internal carotid artery is seen to ascend to the cranial skull base. The petrous, cavernous and the supraclinoid segments are widely patent. The left anterior cerebral artery opacifies into the capillary and venous phases. Prompt opacification via the anterior communicating artery of the right anterior cerebral artery and the right middle cerebral artery is seen into the capillary and venous phases. The left middle cerebral artery demonstrates complete occlusion in the mid M1 segment. PROCEDURE: Over a 0.014 inch standard Synchro micro guidewire with a J-tip configuration, a combination of an 071 32 cm Zoom aspiration catheter with the balloon guide catheter were advanced to the supraclinoid left ICA. The balloon guide was situated at the cervical petrous junction. Using a torque device, access was obtained into the M2 M3 region with the micro guidewire, followed by the microcatheter. The guidewire was removed. Poor aspiration obtained from the hub  of the microcatheter. This was then retrieved more proximally. 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 The micro guidewire and the microcatheter were retrieved more proximally. Using constant aspiration with Penumbra aspiration device at the hub of the Zoom aspiration catheter, and proximal flow arrest in the left internal carotid artery, the Zoom aspiration catheter was advanced more distally into the dominant inferior division branch. There it was maintained for approximately 2 minutes. Thereafter, the Zoom aspiration catheter was gently retrieved and removed. Aspiration was continued with a 20 mL syringe at the hub of the balloon guide catheter. Following flow reversal, a control  arteriogram performed through the balloon guide catheter in the proximal left internal carotid artery demonstrated complete revascularization of the left middle cerebral artery into the capillary and venous phases. The distal MCA branches were widely patent without evidence of intraluminal filling defects. The left anterior cerebral artery demonstrated wide patency with no change in the contralateral right anterior circulation via the anterior communicating artery. The balloon guide catheter was then removed. The right common carotid artery was then selected over an 0.035 inch Roadrunner guidewire with a 5 [place] diagnostic catheter. An arteriogram performed demonstrated severe stenosis at the origin of the right external carotid artery though its branches appeared widely patent. An eccentric smooth plaque was seen projecting in the right common carotid artery proximal to the bifurcation due to an anteriorly positioned plaque. Right internal carotid artery at the bulb is patent with delayed ascent of contrast to the cranial skull base consistent with angiographic string sign. CT of the brain demonstrated no evidence of intracranial hemorrhage or mass effect. The left femoral sheath was removed with hemostasis achieved with manual compression. Distal pulses remained palpable in both feet unchanged at the end of the procedure. Patient was left intubated due to patient being unresponsive due to the stroke, and to protect the airway. Patient was then transferred to PACU and then neuro ICU for post revascularization management. 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 PLAN: Follow-up in the clinic 4 weeks post discharge. Electronically Signed   By: Julieanne Cotton M.D.   On: 11/14/2020 10:13   CT HEAD CODE STROKE WO CONTRAST  Result Date: 11/13/2020 CLINICAL DATA:  Code stroke. EXAM: CT HEAD WITHOUT CONTRAST TECHNIQUE: Contiguous axial images were obtained from the base of the skull through the vertex without intravenous contrast. COMPARISON:  None. FINDINGS: Brain: Asymmetric hypodensity of the left insula, concerning for acute left MCA territory infarct. No acute hemorrhage, hydrocephalus, extra-axial collection or mass lesion/mass effect. Remote infarcts versus dilated perivascular spaces in bilateral inferior basal ganglia. Mild additional patchy white matter hypoattenuation, most likely related to chronic microvascular ischemic disease. Suspected remote lacunar infarct in the right cerebellum. Vascular: Hyperdense appearance of the distal left M1 MCA. Skull: No acute fracture. Sinuses/Orbits: Clear sinuses.  Unremarkable orbits. Other: No mastoid effusions. ASPECTS [facility] Stroke Program Early CT Score) - Ganglionic level infarction (caudate, lentiform nuclei, internal capsule, insula, M1-M3 cortex): 6 - Supraganglionic infarction (M4-M6 cortex): 3 Total score (0-10 with 10 being normal): 9 IMPRESSION: 1. Mild asymmetric hypodensity of the left insula, concerning for acute left MCA territory infarct. An MRI could further evaluate. 2. Hyperdense appearance of the distal left  M1 MCA, concerning for thrombus. Recommend CTA to further evaluate. 3. No acute hemorrhage. 4. Remote lacunar infarcts versus dilated perivascular spaces in bilateral inferior basal ganglia. Suspected remote lacunar infarct in the right cerebellum. Finding discussed with Dr. Derry LoryKhaliqdina via telephone at 1:55 p.m. Electronically Signed   By: Feliberto HartsFrederick S Jones MD   On: 11/13/2020 14:03   CT ANGIO HEAD NECK W WO CM W PERF (CODE STROKE)  Result Date: 11/13/2020 EXAM: CT ANGIOGRAPHY HEAD AND NECK CT PERFUSION BRAIN TECHNIQUE: Multidetector CT imaging of the head and neck was performed using  the standard protocol during bolus administration of intravenous contrast. Multiplanar CT image reconstructions and MIPs were obtained to evaluate the vascular anatomy. Carotid stenosis measurements (when applicable) are obtained utilizing NASCET criteria, using the distal internal carotid diameter as the denominator. Multiphase CT imaging of the brain was performed following IV bolus contrast injection. Subsequent parametric perfusion maps were calculated using RAPID software. CONTRAST:  100mL OMNIPAQUE IOHEXOL 350 MG/ML SOLN COMPARISON:  None. FINDINGS: CTA HEAD FINDINGS Anterior circulation: Left ICA is patent with mild to moderate calcific atherosclerosis. Right ICA is non-opacified to the carotid terminus. The right MCA and right posterior communicating artery are opacified, likely the of the right A1 ACA. There is abrupt occlusion of the left M1 MCA with very little opacification of distal MCA branches. Left A1 and A2 ACAs are patent. Loss of opacification of the distal left ACA (A3/A4) on series 7, images 299 through 302, concerning for high-grade stenosis or occlusion. Posterior circulation: Moderate stenosis of the proximal intradural left vertebral artery due to predominately calcific atherosclerosis. Non dominant right intradural vertebral artery. The basilar artery bilateral posterior cerebral arteries are patent without evidence of proximal flow limiting stenosis. Prominent right posterior communicating artery with hypoplastic or absent right A1 ACA, anatomic variant. Venous sinuses: As permitted by contrast timing, patent. Anatomic variants: As detailed above. CTA NECK FINDINGS Aortic arch: Atherosclerosis.  Great vessel origins are patent. Right carotid system: Patent common carotid artery. There is mixed calcific and noncalcific atherosclerosis at the carotid bifurcation with occlusion of the proximal internal carotid artery. The remainder of the ICA is non-opacified. Left carotid system: Patent  common carotid artery. Calcific and noncalcific atherosclerosis at the carotid bifurcation with approximately 40-50% stenosis Vertebral arteries: Left dominant. Severe stenosis of the non dominant right vertebral artery origins. Skeleton: Severe degenerative disease at C5-C6. Other neck: No acute findings.  Severe emphysema. Upper chest: Severe emphysema. Review of the MIP images confirms the above findings CT Brain Perfusion Findings: ASPECTS: 9 CBF (<30%) Volume: 41mL Perfusion (Tmax>6.0s) volume: 163mL Mismatch Volume: 122mL Infarction Location:Anterior left MCA territory IMPRESSION: 1. Occlusion of the left M1 MCA with very little opacification of distal left MCA branches. Perfusion demonstrates the core infarct of 41 mL in the anterior left MCA territory with large (122 ml) of ischemic phenomena in the remaining left MCA and potentially left ACA territories. 2. Age indeterminate occlusion of the proximal right internal carotid artery in the neck with non-opacification through the carotid terminus. The right MCA and right posterior communicating artery are opacified via the of the right A1 ACA. 3. Suspected high-grade stenosis versus occlusion of the distal left A3/A4 ACA. 4. Approximately 40-50% stenosis of the proximal left ICA in the neck. 5. Severe stenosis of the non dominant/small right vertebral artery origin. Moderate stenosis of the intradural left vertebral artery. 6. Severe emphysema. Critical findings discussed with Dr. Derry LoryKhaliqdina at 1:57 p.m. Electronically Signed   By: Feliberto HartsFrederick S Jones  MD   On: 11/13/2020 14:30    Labs:  CBC: Recent Labs    01/25/20 0923 11/13/20 1345 11/13/20 1345 11/13/20 1351 11/13/20 1742 11/14/20 0237 11/15/20 0303  WBC 9.6 9.7  --   --   --  17.2* 18.1*  HGB 17.2* 14.1   < > 13.9 13.9 14.4 13.4  HCT 50.0* 44.2   < > 41.0 41.0 44.1 42.6  PLT 270 240  --   --   --  246 190   < > = values in this interval not displayed.    COAGS: Recent Labs     11/13/20 1345 11/14/20 0237 11/15/20 0303  INR 1.0 0.9 1.0  APTT 27  --   --     BMP: Recent Labs    12/27/19 1158 01/25/20 0923 08/16/20 1314 11/13/20 1345 11/13/20 1351 11/13/20 1742 11/13/20 2142 11/14/20 0237 11/15/20 0303  NA 139 140   < > 141 141 141 137 141 142  K 4.7 4.8   < > 4.2 3.5 3.3* 3.4* 4.7 4.1  CL 101 101   < > 105 108  --  105 108 109  CO2 24 28   < > 27  --   --  22 21* 22  GLUCOSE 102* 91   < > 88 89  --  142* 118* 90  BUN 23 29*   < > 24* 28*  --  23 25* 24*  CALCIUM 9.4 9.5   < > 8.7*  --   --  8.0* 8.4* 8.3*  CREATININE 0.72 0.99   < > 0.77 0.60  --  0.82 0.90 0.80  GFRNONAA 83 56*  --  >60  --   --  >60 >60 >60  GFRAA 95 65  --   --   --   --   --   --   --    < > = values in this interval not displayed.    LIVER FUNCTION TESTS: Recent Labs    11/13/20 1345  BILITOT 1.7*  AST 48*  ALT 25  ALKPHOS 60  PROT 5.7*  ALBUMIN 3.1*    Assessment and Plan:  75 yo female who presented to [facility] ED via EMS with right sided weakness, right facial droop, and left gaze preference. Code Stroke initiated, CTA head & neck showed Occlusion of the left M1 MCA with very little opacification of distal left MCA branches. Patient is s/p bilateral common carotid arteriogram via Lt CFA approach, s/p complete revascularization of occluded Lt MCA M 1 seg achieving a TICI 3 revascularization with Dr. Corliss Skains on 8/1.   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 Corliss Skains.   Patient was extubated yesterday, tolerated well.  Developed A fib RVR this morning, on amiodarone gtt.   VS fever 100.8 F, BP stable, HR 119   Pt evaluated by neurology yesterday, neuro exam stable per neuro.  MRI on 8/2:   1. 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 Associated edema without  mass effect. 2. Additional scattered punctate acute infarcts in the right frontoparietal cortex and small acute infarct in the right cerebellum.  Further treatment plan per Stroke/ PCCM/  Appreciate and agree with the plan.  Please call NIR for questions and concerns.    Electronically Signed: Willette Brace, PA-C 11/15/2020,  9:17 AM   I spent a total of 25 Minutes at the the patient's bedside AND on the patient's hospital floor or unit, greater than 50% of which was counseling/coordinating care for Code Stroke

## 2020-11-16 ENCOUNTER — Encounter (HOSPITAL_COMMUNITY): Payer: Self-pay

## 2020-11-16 DIAGNOSIS — I6602 Occlusion and stenosis of left middle cerebral artery: Secondary | ICD-10-CM | POA: Diagnosis not present

## 2020-11-16 DIAGNOSIS — E43 Unspecified severe protein-calorie malnutrition: Secondary | ICD-10-CM

## 2020-11-16 DIAGNOSIS — I4891 Unspecified atrial fibrillation: Secondary | ICD-10-CM | POA: Diagnosis not present

## 2020-11-16 DIAGNOSIS — I63512 Cerebral infarction due to unspecified occlusion or stenosis of left middle cerebral artery: Secondary | ICD-10-CM | POA: Diagnosis not present

## 2020-11-16 HISTORY — PX: IR ANGIO INTRA EXTRACRAN SEL COM CAROTID INNOMINATE UNI L MOD SED: IMG5358

## 2020-11-16 LAB — PROTIME-INR
INR: 1.1 (ref 0.8–1.2)
Prothrombin Time: 13.9 seconds (ref 11.4–15.2)

## 2020-11-16 LAB — GLUCOSE, CAPILLARY
Glucose-Capillary: 120 mg/dL — ABNORMAL HIGH (ref 70–99)
Glucose-Capillary: 131 mg/dL — ABNORMAL HIGH (ref 70–99)
Glucose-Capillary: 135 mg/dL — ABNORMAL HIGH (ref 70–99)
Glucose-Capillary: 136 mg/dL — ABNORMAL HIGH (ref 70–99)
Glucose-Capillary: 143 mg/dL — ABNORMAL HIGH (ref 70–99)
Glucose-Capillary: 145 mg/dL — ABNORMAL HIGH (ref 70–99)
Glucose-Capillary: 151 mg/dL — ABNORMAL HIGH (ref 70–99)

## 2020-11-16 LAB — CBC
HCT: 39.1 % (ref 36.0–46.0)
Hemoglobin: 12.6 g/dL (ref 12.0–15.0)
MCH: 31.3 pg (ref 26.0–34.0)
MCHC: 32.2 g/dL (ref 30.0–36.0)
MCV: 97.3 fL (ref 80.0–100.0)
Platelets: 208 10*3/uL (ref 150–400)
RBC: 4.02 MIL/uL (ref 3.87–5.11)
RDW: 19.2 % — ABNORMAL HIGH (ref 11.5–15.5)
WBC: 17.2 10*3/uL — ABNORMAL HIGH (ref 4.0–10.5)
nRBC: 0 % (ref 0.0–0.2)

## 2020-11-16 LAB — PHOSPHORUS
Phosphorus: 2.7 mg/dL (ref 2.5–4.6)
Phosphorus: 1.7 mg/dL — ABNORMAL LOW (ref 2.5–4.6)

## 2020-11-16 LAB — HEPARIN LEVEL (UNFRACTIONATED)
Heparin Unfractionated: <0.1 IU/mL — ABNORMAL LOW (ref 0.30–0.70)
Heparin Unfractionated: 0.15 IU/mL — ABNORMAL LOW (ref 0.30–0.70)
Heparin Unfractionated: 0.2 IU/mL — ABNORMAL LOW (ref 0.30–0.70)

## 2020-11-16 LAB — BASIC METABOLIC PANEL
Anion gap: 10 (ref 5–15)
BUN: 29 mg/dL — ABNORMAL HIGH (ref 8–23)
CO2: 20 mmol/L — ABNORMAL LOW (ref 22–32)
Calcium: 8 mg/dL — ABNORMAL LOW (ref 8.9–10.3)
Chloride: 109 mmol/L (ref 98–111)
Creatinine, Ser: 0.67 mg/dL (ref 0.44–1.00)
GFR, Estimated: >60 mL/min (ref >60)
Glucose, Bld: 135 mg/dL — ABNORMAL HIGH (ref 70–99)
Potassium: 3.8 mmol/L (ref 3.5–5.1)
Sodium: 139 mmol/L (ref 135–145)

## 2020-11-16 LAB — MAGNESIUM
Magnesium: 1.8 mg/dL (ref 1.7–2.4)
Magnesium: 2.3 mg/dL (ref 1.7–2.4)

## 2020-11-16 MED ORDER — AMIODARONE HCL IN DEXTROSE 360-4.14 MG/200ML-% IV SOLN
30.0000 mg/h | INTRAVENOUS | Status: DC
  Administered 2020-11-16 – 2020-11-18 (×4): 30 mg/h via INTRAVENOUS
  Filled 2020-11-16 (×4): qty 200

## 2020-11-16 MED ORDER — PANTOPRAZOLE SODIUM 40 MG PO PACK
40.0000 mg | PACK | Freq: Every day | ORAL | Status: DC
  Administered 2020-11-16 – 2020-12-05 (×20): 40 mg
  Filled 2020-11-16 (×20): qty 20

## 2020-11-16 MED ORDER — MAGNESIUM SULFATE 2 GM/50ML IV SOLN
2.0000 g | Freq: Once | INTRAVENOUS | Status: AC
  Administered 2020-11-16: 2 g via INTRAVENOUS
  Filled 2020-11-16: qty 50

## 2020-11-16 MED ORDER — BUPROPION HCL 75 MG PO TABS
75.0000 mg | ORAL_TABLET | Freq: Two times a day (BID) | ORAL | Status: DC
  Administered 2020-11-16 – 2020-12-05 (×40): 75 mg
  Filled 2020-11-16 (×42): qty 1

## 2020-11-16 NOTE — Progress Notes (Signed)
Physical Therapy Treatment Patient Details Name: Beverly Li MRN: [id number] DOB: 01/16/1946 Today's Date: 11/16/2020    History of Present Illness Pt is a 75 y.o. female who presented 11/13/20 with R sided weakness and L gaze preference. Outside window for tPA. 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 S/p thrombectomy with TICI 3 revascularization 8/1. ETT 8/1-8/2. 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 PMH: AFib on Eliquis (off x 1 week), COPD, remote tobacco abuse, hypertrophic cardiomyopathy, aortic stenosis, PAD, anxiety, arthritis, Bell's palsy, HTN, osteopenia, and GERD    PT Comments    Pt is beginning to follow multi-modal commands, ~20%. She benefits particularly from tactile cues to initiate movements. She is also beginning to track and find people to make eye contact, but she continues to not communicate verbally. She is requiring TA x2 to roll and use the maxisky to lift her to transfer bed > recliner. Attempted transfers from the recliner without success this date. Focused remainder of session on attempting to facilitate neural stimulation and muscle activation in her R UE and lower extremity, but no active movement noted on R side this date. Educated pt's family to sit to her R and touch her R side to further encourage R side awareness. Will continue to follow acutely. Current recommendations remain appropriate.    Follow Up Recommendations  CIR;Supervision/Assistance - 24 hour     Equipment Recommendations  3in1 (PT);Hospital bed    Recommendations for Other Services Rehab consult     Precautions / Restrictions Precautions Precautions: Fall Restrictions Weight Bearing Restrictions: No    Mobility  Bed Mobility Overal bed mobility: Needs Assistance Bed Mobility:  Rolling Rolling: Total assist;+2 for physical assistance         General bed mobility comments: Roll 1x each direction to place maxisky pad for transfer to chair, pt did flex L knee upon being cued to assist with roll towards R but otherwise min-no active assistance from pt, needing TAx2    Transfers Overall transfer level: Needs assistance Equipment used: 2 person hand held assist Transfers: Sit to/from Stand Sit to Stand: Total assist;+2 physical assistance;+2 safety/equipment         General transfer comment: Maxisky dependent lift from bed > chair with +2 assistance. Attempted sit to stand from recliner x3, but unsuccessful with bil knee block and TAx2. No active assistance noted from pt with transfer attempts.  Ambulation/Gait             General Gait Details: Unable this date.   Stairs             Wheelchair Mobility    Modified Rankin (Stroke Patients Only) Modified Rankin (Stroke Patients Only) Pre-Morbid Rankin Score: No symptoms Modified Rankin: Severe disability     Balance Overall balance assessment: Needs assistance Sitting-balance support: Single extremity supported;No upper extremity supported;Feet supported Sitting balance-Leahy Scale: Poor Sitting balance - Comments: Mod-TA to lean anteriorly in recliner to sit up without back support, leaning to R. Postural control: Posterior lean;Right lateral lean     Standing balance comment: Unable this date.                            Cognition Arousal/Alertness: Awake/alert Behavior During Therapy: Flat affect Overall Cognitive Status: Impaired/Different from baseline  Area of Impairment: Attention;Following commands;Safety/judgement;Awareness;Problem solving                   Current Attention Level: Focused   Following Commands: Follows one step commands inconsistently;Follows one step commands with increased time (~20% multi-modal cues) Safety/Judgement: Decreased  awareness of safety;Decreased awareness of deficits Awareness: Intellectual Problem Solving: Decreased initiation;Requires verbal cues;Requires tactile cues;Difficulty sequencing;Slow processing General Comments: Pt tracking therapist intermittently and able to rotate eyes to the R to make eye contact with therapist. Beginning to follow multi-modal cues with delayed processing, inconsistently (~20%). Benefits from tactile cues. No verbalization, only clearing throat occasionally. Poor initiation and awareness into midline body positioning.      Exercises General Exercises - Upper Extremity Shoulder Flexion: AAROM;Both;10 reps;Seated (Cued pt with hand-over-hand to hold R wrist with L hand and assist with bil simultaneous shoulder flexion to < 90 degrees; no active assistance from R noted) General Exercises - Lower Extremity Long Arc Quad: AAROM;Both;10 reps;Seated (no active assistance from R noted; L able to lift against gravity without assistance, needing tactile cues to initiate)    General Comments General comments (skin integrity, edema, etc.): VSS on 2 L O2 via [place]; educated pt's family to sit to pt's R and touch her R limbs to try to get her to attend to them      Pertinent Vitals/Pain Pain Assessment: Faces Faces Pain Scale: No hurt Pain Intervention(s): Monitored during session    Home Living                      Prior Function            PT Goals (current goals can now be found in the care plan section) Acute Rehab PT Goals Patient Stated Goal: did not state; family desires for pt to improve PT Goal Formulation: Patient unable to participate in goal setting Time For Goal Achievement: 11/29/20 Potential to Achieve Goals: Fair Progress towards PT goals: Progressing toward goals    Frequency    Min 4X/week      PT Plan Current plan remains appropriate    Co-evaluation              AM-PAC PT "6 Clicks" Mobility   Outcome Measure  Help needed  turning from your back to your side while in a flat bed without using bedrails?: Total Help needed moving from lying on your back to sitting on the side of a flat bed without using bedrails?: Total Help needed moving to and from a bed to a chair (including a wheelchair)?: Total Help needed standing up from a chair using your arms (e.g., wheelchair or bedside chair)?: Total Help needed to walk in hospital room?: Total Help needed climbing 3-5 steps with a railing? : Total 6 Click Score: 6    End of Session Equipment Utilized During Treatment: Gait belt;Oxygen;Other (comment) (hoyer lift) Activity Tolerance: Patient tolerated treatment well Patient left: with call bell/phone within reach;in chair;with chair alarm set;with family/visitor present Nurse Communication: Mobility status;Need for lift equipment PT Visit Diagnosis: Muscle weakness (generalized) (M62.81);Difficulty in walking, not elsewhere classified (R26.2);Other symptoms and signs involving the nervous system (R29.898);Hemiplegia and hemiparesis Hemiplegia - Right/Left: Right Hemiplegia - dominant/non-dominant: Dominant Hemiplegia - caused by: Cerebral infarction     Time: 0830-0906 PT Time Calculation (min) (ACUTE ONLY): 36 min  Charges:  $Therapeutic Activity: 8-22 mins $Neuromuscular Re-education: 8-22 mins  Raymond Gurney, PT, DPT Acute Rehabilitation Services  Pager: [phone] Office: [phone]    Jewel Baize 11/16/2020, 9:54 AM

## 2020-11-16 NOTE — Progress Notes (Signed)
NAME:  Beverly Li, MRN:  [id number], DOB:  [birth year], LOS: 3 ADMISSION DATE:  11/13/2020, CONSULTATION DATE:  11/13/2020 REFERRING MD:  Derry Lory CHIEF COMPLAINT:  AMS   History of Present Illness:  75 year old woman with PMHx significant for Afib (on Eliquis, held x 1 week), AS, COPD, PAD, anxiety, GERD who presented to [facility] 8/1 with right-sided weakness, right facial droop and left gaze preference.    Of note, patient's AC recently held due to hospitalization for a large hematoma 2/2 significant coughing as a result of pneumonia. She was recently admitted to [facility] in [place]. [place] and while hospitalized went into Afib with RVR, kept for several days to manage. No medications were changed at discharge. She was advised to follow up with cardiology for AF management and when to restart anticoagulation.  CT Head on arrival to [facility] demonstrated signs of L MCA occlusion; patient was subsequently intubated for AMS and airway protection.  Taken to [facility] for thrombectomy with TICI 3 flow after 1 pass to L MCA M1 seg. Brought to ICU  on vent, Cleviprex and Propofol gtts.   PCCM consulted for vent and ICU BP management.  Pertinent Medical History:  Aortic stenosis Hypertrophic cardiomyopathy Afib on Tikosyn, AC Recent hospitalization for ?hematoma unclear origin PVD COPD - no PFTs on file  Significant Hospital Events: Including procedures, antibiotic start and stop dates in addition to other pertinent events   8/1 admitted, intubated, thrombectomy 8/2 Successful thrombectomy with complete revasculatization, TICI 3 flow. MRI with L ACA/MCA territory infarcts and multiple small punctate infarcts. Extubated late in day to [place]. 8/3 Afib on tele. Amio load + gtt started. Cortrak placement. 8/4 Slow improvement in neuro status, following more commands (LUE/LLE), tracking consistently, working with PT/OT/SLP.  Interim History / Subjective:  Per son and therapy team at bedside,  patient following more commands today Able to follow commands consistently with LUE/LLE Tracking with eyes past midline, more alert Undergoing breathing treatment Working with PT/OT today, getting up to chair D/c amio now that Afib rate-controlled on home meds Possible transfer out of ICU, defer to Stroke team  Objective   Blood pressure (!) 132/96, pulse 93, temperature 97.9 F (36.6 C), temperature source Oral, resp. rate 19, height 5\' 3"  (1.6 m), weight 54.3 kg, SpO2 97 %.        Intake/Output Summary (Last 24 hours) at 11/16/2020 0810 Last data filed at 11/16/2020 0600 Gross per 24 hour  Intake 2568.09 ml  Output 650 ml  Net 1918.09 ml    Filed Weights   11/13/20 1300 11/16/20 0421  Weight: 51.4 kg 54.3 kg   Physical Examination: General: Acutely ill-appearing elderly woman in NAD. HEENT: [place]/AT, anicteric sclera, PERRL, moist mucous membranes. Neuro: Awake, unable to assess orientation due to aphasia.Tracking with eyes consistently, including past midline. Responds to verbal stimuli. Following commands consistently. Unilateral R-sided neglect noted. +Flicker on R. CV: Irregularly irregular rhythm. PULM: Breathing even and unlabored on 2LNC (NRB in place temporarily for breathing treatment). Lung fields CTAB, slightly diminished at bilateral bases. GI: Soft, nontender, nondistended. Normoactive bowel sounds. Extremities: Trace LE edema noted. Skin: Warm/dry,  extensive ecchymosis to anterior lower abdomen/pelvic region.  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 CT Head demonstrating c/f L MCA territory infarct. Taken to [facility] for intervention; successful complete revascularization with 1 pass, TICI 3  flow. MRI 8/2 demonstrating L ACA/MCA territory infarct and small punctate infarcts. Etiology of stroke most likely Afib off of AC due to  hematoma. - Management per Stroke team, NIR - Goal SBP 120-140 - Cleviprex discontinued - Home medications (Bystolic, Diltiazem, Hydralazine) resumed - Heparin gtt initiated 8/3 for anticoagulation - Stroke prevention with statin administration (consider patient's previous AE with agents in same class) - Likely stable for transfer to progressive floor, defer to Stroke team  Acute hypoxemic respiratory failure in setting of large left MCA stroke History of COPD, not in flare Extubated 8/2. - Wean supplemental O2 for sat > 90% - Continue Roxy Manns - Pulmonary hygiene  Hypertensive emergency, improved - Cleviprex discontinued - Home meds resumed - Goal SBP remains 120-140  Afib previously on [facility] Hypertrophic cardiomyopathy Recent hematoma of unclear etiology requiring AC cessation History of Afib. Recently hospitalized for PNA, Afib with RVR. AC recently held x 1 week due to hematoma sustained 2/2 ?profuse coughing. Echo 8/2 with EF 60-65% - S/p Amio load + gtt - Discontinue amiodarone, given resumption of home meds (Bystolic, Diltiazem, Hydralazine) - Heparin gtt for AC until stable for DOAC resumption  GERD - PPI  At risk for malnutrition Physical deconditioning - S/p Cortrak placement 8/3 - SLP/PT/OT following, appreciate recs - CIR eval once able to tolerate/participate in therapies  Best Practice (right click and "Reselect all SmartList Selections" daily)   Diet/type: tubefeeds and NPO DVT prophylaxis: SCD GI prophylaxis: PPI Lines: N/A Foley:  N/A Code Status:  full code Last date of multidisciplinary goals of care discussion [Per primary]  Critical care time: 32 minutes   Tim Lair, PA-C [facility] 11/16/20 8:10 AM  Please see [email or web address] for pager details.  From 7A-7P if no response, please call [phone] After hours, please call ELink [phone]

## 2020-11-16 NOTE — Progress Notes (Signed)
ANTICOAGULATION CONSULT NOTE  Pharmacy Consult for heparin Indication: atrial fibrillation  Patient Measurements: Height: 5\' 3"  (160 cm) Weight: 54.3 kg (119 lb 11.4 oz) IBW/kg (Calculated) : 52.4 Heparin Dosing Weight: 51 kg   Vital Signs: Temp: 97.9 F (36.6 C) (08/04 0700) Temp Source: Oral (08/04 0700) BP: 146/102 (08/04 1200) Pulse Rate: 101 (08/04 1200)  Labs: Recent Labs    11/13/20 1345 11/13/20 1351 11/14/20 0237 11/15/20 0303 11/15/20 1633 11/16/20 0003 11/16/20 1056  HGB 14.1   < > 14.4 13.4  --  12.6  --   HCT 44.2   < > 44.1 42.6  --  39.1  --   PLT 240  --  246 190  --  208  --   APTT 27  --   --   --   --   --   --   LABPROT 12.9  --  12.3 12.6  --  13.9  --   INR 1.0  --  0.9 1.0  --  1.1  --   HEPARINUNFRC  --   --   --   --  0.12* <0.10* 0.15*  CREATININE 0.77   < > 0.90 0.80  --  0.67  --    < > = values in this interval not displayed.     Estimated Creatinine Clearance: 50.3 mL/min (by C-G formula based on SCr of 0.67 mg/dL).   Assessment: 70 YOF with h/o Afib on Eliquis PTA who presented with an MCA occlusion and is s/p successful mechanical thrombectomy. Of note, patient's Eliquis was on hold ~ 1 week prior to admission due to recent hematoma. Pharmacy consulted to dose IV heparin for AFib.   Heparin level SUBtherapeutic (HL 0.15, goal of 0.3-0.5).  No issue with infusion/IV site; no bleeding reported.  Goal of Therapy:  Heparin level 0.3-0.5 units/ml Monitor platelets by anticoagulation protocol: Yes   Plan:  Increase heparin infusion to 950 units/hr.  No bolus Check 8 hr heparin level Daily heparin level and CBC  Alpa Salvo D. 61, PharmD, BCPS, BCCCP 11/16/2020, 12:43 PM

## 2020-11-16 NOTE — Consult Note (Addendum)
Cardiology Consultation:   Patient ID: [name] MRN: [id number]; DOB: 1946/03/20  Admit date: 11/13/2020 Date of Consult: 11/16/2020  PCP:  Clayborn Heron, MD   [place] [facility] Providers Cardiologist:  Donato Schultz, MD  Electrophysiologist:  Sherryl Manges, MD       Patient Profile:   Beverly Li is a 75 y.o. female with a hx of Persistent AF RVR, off OAC 2nd hematoma, HTN, HOCM, GERD, orthostatic hypotension, mod LAD dz 2018 cath, PVD s/p R fem-pop bpg 2016, who is being seen 11/16/2020 for the evaluation of Atrial fibrillation at the request of Dr Merrily Pew.  History of Present Illness:   Beverly Li was hospitalizted in [facility], [place] for a large hematoma that occurred after coughing. Her Eliquis was held for this. She went into Afib while there.   Phone notes on 07/26-27 reviewed, about the hematoma and meds. Dr Graciela Husbands recommended d/c Tikosyn since she was in prolonged Afib and not anticoagulated. She was to hold the Eliquis for a couple more weeks, till her hematoma resolved. She was to come in this week for a visit.  Pt admitted 08/01 as a code Stroke. R weakness, on CT, LVO w L M1 MCA cutoff >> IR for thrombectomy after intubation.  She was extubated on 08/02.  She remained in atrial fib, with bradycardia on propofol >> d/c'd. Since then, her HR has been elevated. She was started on IV amio 08/03, Bystolic 10 mg qd is home dose since she saw Dr Anne Fu 09/12/2020.   Placed on IV heparin started for anticoag.   Pt w/ dense R hemiplegia, aphasic.  No family is present, husband would be the best resource, can be contacted by phone.    Past Medical History:  Diagnosis Date   Anxiety    Arthritis    "some in my lower back; probably elbows, knees" (11/18/2017)   Atrial fibrillation (HCC)    Bell's palsy    when pt. was 75 yrs old, when under stress the left side of face will droop.   Complication of anesthesia    "vascular OR 2016; BP bottomed out; couldn't get it  regulated; ended up in ICU for DAYS" (11/18/2017)   GERD (gastroesophageal reflux disease)    Heart murmur    History of kidney stones    Hypertension    Hypertrophic cardiomyopathy (HCC)    severe LV basilar hypertrophy witn no evidence of significant outflow tract obstruction, EF 65-70%, mild LAE, mild TR, grade 1a diastolic dysfunction 05/15/10 (Dr. Donato Schultz) (Atrial Septal Hypertrophy pattern)-- Intra-op TEE with dsignificant outflow tract obstruction - AI, MR & TR   Insomnia    Mild aortic sclerosis    Osteopenia    Peripheral vascular disease (HCC)    Syncope    , Vagal    Past Surgical History:  Procedure Laterality Date   AUGMENTATION MAMMAPLASTY Bilateral    BACK SURGERY     CARDIAC CATHETERIZATION N/A 05/07/2016   Procedure: Left Heart Cath and Coronary Angiography;  Surgeon: Kathleene Hazel, MD;  Location: [facility] INVASIVE CV LAB;  Service: Cardiovascular;  Laterality: N/A;   CARDIOVERSION N/A 09/24/2017   Procedure: CARDIOVERSION;  Surgeon: Laurey Morale, MD;  Location: [facility] ENDOSCOPY;  Service: Cardiovascular;  Laterality: N/A;   DILATION AND CURETTAGE OF UTERUS     ENDARTERECTOMY FEMORAL Right 03/02/2015   Procedure: ENDARTERECTOMY RIGHT FEMORAL;  Surgeon: Chuck Hint, MD;  Location: [facility] OR;  Service: Vascular;  Laterality: Right;   FACIAL COSMETIC  SURGERY Left 2002   "related to Bell's Palsy @ age 38; left eye/side of face droopy; tried to make area symmetrical"   FEMORAL-POPLITEAL BYPASS GRAFT Right 03/02/2015   Procedure: BYPASS GRAFT FEMORAL-BELOW KNEE POPLITEAL ARTERY;  Surgeon: Chuck Hint, MD;  Location: [facility] OR;  Service: Vascular;  Laterality: Right;   INGUINAL HERNIA REPAIR Bilateral 2002   IR ANGIO INTRA EXTRACRAN SEL COM CAROTID INNOMINATE UNI L MOD SED  11/16/2020   IR CT HEAD LTD  11/13/2020   IR PERCUTANEOUS ART THROMBECTOMY/INFUSION INTRACRANIAL INC DIAG ANGIO  11/13/2020   OVARIAN CYST REMOVAL Left    PERIPHERAL VASCULAR CATHETERIZATION  N/A 01/16/2015   Procedure: Abdominal Aortogram;  Surgeon: Chuck Hint, MD;  Location: [facility] INVASIVE CV LAB;  Service: Cardiovascular;  Laterality: N/A;   POSTERIOR LUMBAR FUSION  2015   "have plates and screws in there"   RADIOLOGY WITH ANESTHESIA N/A 11/13/2020   Procedure: IR WITH ANESTHESIA;  Surgeon: Radiologist, Medication, MD;  Location: [facility] OR;  Service: Radiology;  Laterality: N/A;   TONSILLECTOMY       Home Medications:  Prior to Admission medications   Medication Sig Start Date End Date Taking? Authorizing Provider  buPROPion (WELLBUTRIN XL) 150 MG 24 hr tablet Take 150 mg by mouth every morning.    Yes [provider]  Calcium-Phosphorus-Vitamin D (CALCIUM/D3 ADULT GUMMIES PO) Take 2 tablets by mouth daily at 2 PM.   Yes [provider]  DEXILANT 30 MG capsule Take 30 mg by mouth daily. 12/21/19  Yes [provider]  diltiazem (CARDIZEM) 30 MG tablet Take 1 tablet (30 mg total) by mouth daily as needed (blood pressure higher than 160). 07/02/19  Yes [name], MD  ezetimibe (ZETIA) 10 MG tablet TAKE 1 TABLET(10 MG) BY MOUTH DAILY Patient taking differently: Take 10 mg by mouth daily. 03/02/20  Yes [name], MD  HYDROcodone-acetaminophen (NORCO/VICODIN) 5-325 MG tablet Take 1 tablet by mouth every 6 (six) hours as needed for pain. LF on 11-07-20 # 20 11/07/20  Yes [provider]  midodrine (PROAMATINE) 10 MG tablet TAKE 1 TABLET BY MOUTH THREE TIMES DAILY WITH MEALS AS NEEDED FOR BLOD PRESSURE LOWER THAN 100(SYSTOLIC) Patient taking differently: Take 10 mg by mouth 3 (three) times daily as needed (if systolic BP is lower than 100). 12/01/19  Yes [name], MD  Multiple Vitamins-Iron (STRESS B COMPLEX/IRON) TABS Take 1 tablet by mouth at bedtime.    Yes [provider]  nebivolol (BYSTOLIC) 10 MG tablet Take 1 tablet (10 mg total) by mouth daily. 09/12/20  Yes [name], MD  potassium chloride (KLOR-CON) 10 MEQ tablet  TAKE 1 TABLET(10 MEQ) BY MOUTH DAILY Patient taking differently: Take 10 mEq by mouth daily. 05/12/20  Yes Duke Salvia, MD  PROAIR HFA 108 [phone] Base) MCG/ACT inhaler Inhale 2 puffs into the lungs 4 (four) times daily as needed (for wheezing/shortness of breath.).  03/03/17  Yes [provider]  TRELEGY ELLIPTA 100-62.5-25 MCG/INH AEPB Take 1 puff by mouth daily. 08/10/20  Yes [provider]  zolpidem (AMBIEN) 10 MG tablet Take 10 mg by mouth at bedtime as needed for sleep. 03/10/19  Yes [provider]  dofetilide (TIKOSYN) 250 MCG capsule Take 250 mcg by mouth 2 (two) times daily.    [provider]  ELIQUIS 5 MG TABS tablet TAKE 1 TABLET(5 MG) BY MOUTH TWICE DAILY Patient taking differently: Take 5 mg by mouth 2 (two) times daily.  10/24/20   [name], MD  hydrALAZINE (APRESOLINE) 25 MG tablet TAKE 1 TABLET BY MOUTH EVERY 8 HOURS AS NEEDED FOR SYSTOLIC BLOOD PRESSURE GREATER THAN 160 Patient taking differently: Take 25 mg by mouth every 8 (eight) hours as needed (for SBP is > than 160). 07/02/19   Duke Salvia, MD    Inpatient Medications: Scheduled Meds:  arformoterol  15 mcg Nebulization BID   budesonide (PULMICORT) nebulizer solution  0.25 mg Nebulization BID   buPROPion  75 mg Per Tube BID   Chlorhexidine Gluconate Cloth  6 each Topical Daily   ezetimibe  10 mg Per Tube Daily   feeding supplement (PROSource TF)  45 mL Per Tube BID   insulin aspart  0-9 Units Subcutaneous Q4H   mouth rinse  15 mL Mouth Rinse BID   nebivolol  10 mg Per Tube Daily   pantoprazole sodium  40 mg Per Tube QHS   potassium chloride  10 mEq Per Tube Daily   revefenacin  175 mcg Nebulization Daily   Continuous Infusions:  sodium chloride 75 mL/hr at 11/16/20 0600   amiodarone 30 mg/hr (11/16/20 1418)   feeding supplement (OSMOLITE 1.2 CAL) 35 mL/hr at 11/16/20 0300   heparin 950 Units/hr (11/16/20 1244)   PRN Meds: acetaminophen **OR** acetaminophen (TYLENOL)  oral liquid 160 mg/5 mL **OR** acetaminophen, albuterol, diltiazem, fentaNYL (SUBLIMAZE) injection, hydrALAZINE, hydrALAZINE, ondansetron (ZOFRAN) IV  Allergies:    Allergies  Allergen Reactions   Amoxicillin Other (See Comments)    UTI Has patient had a PCN reaction causing immediate rash, facial/tongue/throat swelling, SOB or lightheadedness with hypotension: No Has patient had a PCN reaction causing severe rash involving mucus membranes or skin necrosis: No Has patient had a PCN reaction that required hospitalization: No Has patient had a PCN reaction occurring within the last 10 years: Yes--UTI ONLY If all of the above answers are "NO", then may proceed with Cephalosporin use.    Atenolol Cough   Crestor [Rosuvastatin Calcium] Other (See Comments)    Muscle aches   Pravastatin Other (See Comments)    Muscle aches   Sulfa Antibiotics Nausea Only   Codeine     hallucinations    Social History:   Social History   Socioeconomic History   Marital status: Married    Spouse name: Not on file   Number of children: Not on file   Years of education: Not on file   Highest education level: Not on file  Occupational History   Not on file  Tobacco Use   Smoking status: Former    Packs/day: 1.00    Years: 50.00    Pack years: 50.00    Types: Cigarettes    Quit date: 12/15/2014    Years since quitting: 5.9   Smokeless tobacco: Never  Vaping Use   Vaping Use: Never used  Substance and Sexual Activity   Alcohol use: Yes    Comment: 11/18/2017 "might have a couple glasses of wine/month; if that"   Drug use: Never   Sexual activity: Not Currently  Other Topics Concern   Not on file  Social History Narrative   Not on file   Social Determinants of Health   Financial Resource Strain: Not on file  Food Insecurity: Not on file  Transportation Needs: Not on file  Physical Activity: Not on file  Stress: Not on file  Social Connections: Not on file  Intimate Partner Violence: Not  on file    Family History:  Family History  Problem Relation Age of Onset   Liver cancer Mother    Cancer Mother        Liver   Hypertension Mother    Lung cancer Father    Cancer Father        Lung   Breast cancer Sister    Cancer Sister        Breast     ROS:  Please see the history of present illness.  All other ROS reviewed and negative.     Physical Exam/Data:   Vitals:   11/16/20 1000 11/16/20 1100 11/16/20 1200 11/16/20 1300  BP: (!) 138/94 (!) 136/91 (!) 146/102 137/89  Pulse: (!) 107 (!) 114 (!) 101 92  Resp: 20 16 18 20   Temp:      TempSrc:      SpO2: (!) 88% 97% 94% 98%  Weight:      Height:        Intake/Output Summary (Last 24 hours) at 11/16/2020 1509 Last data filed at 11/16/2020 1300 Gross per 24 hour  Intake 2765.45 ml  Output 650 ml  Net 2115.45 ml   Last 3 Weights 11/16/2020 11/13/2020 09/12/2020  Weight (lbs) 119 lb 11.4 oz 113 lb 5.1 oz 109 lb  Weight (kg) 54.3 kg 51.4 kg 49.442 kg     Body mass index is 21.21 kg/m.  General:  Well nourished, elderly, female, aphasic and not moving R side HEENT: normocephalic and atraumatic, R side of face does not move Lymph: no adenopathy Neck: no JVD Endocrine:  No thryomegaly Vascular: No carotid bruits; 4/4 extremity pulses 2+  Cardiac:  normal S1, S2; Irreg R&R; no murmur  Lungs:  upper airway rales and rhonchi to auscultation bilaterally, no wheezing  Abd: soft, moderately firm, no hepatomegaly  Ext: no edema Musculoskeletal:  No deformities, no movement R side, not following commands on L Skin: warm and dry, extensive area of ecchymosis on entire abdomen that goes around to her back, some on upper thighs. It has greatly improved, but it is possible to see how big it was. Neuro:  R hemiplegia  EKG:  The EKG was personally reviewed and demonstrates:  08/03 ECG is atrial fib, RVR, HR 131, lateral ST changes noted Telemetry:  Telemetry was personally reviewed and demonstrates:  atrial fib, initially RVR,  but HR has improved gradually over the last 12 hours  Relevant CV Studies:  ECHO: 11/14/2020  1. There is no evidence of systolic anterior motion of the mitral valve or true LV outflow obstruction on this study. LVOT velocities are mildly elevated at 2 m/s. Left ventricular ejection fraction, by estimation, is  60 to 65%. The left ventricle has low normal function. The left ventricle has no regional wall motion  abnormalities. There is moderate concentric left ventricular hypertrophy. Left ventricular diastolic parameters are consistent with Grade II diastolic dysfunction (pseudonormalization). Elevated left atrial pressure.   2. Right ventricular systolic function is normal. The right ventricular size is normal.   3. Left atrial size was severely dilated.   4. Right atrial size was mildly dilated.   5. The mitral valve is degenerative. Mild to moderate mitral valve regurgitation.   6. The aortic valve is tricuspid. There is moderate calcification of the aortic valve. There is mild thickening of the aortic valve. Aortic valve  regurgitation is mild. Mild to moderate aortic valve  sclerosis/calcification is present, without any  evidence of aortic stenosis.   7. The inferior vena cava is dilated in  size with <50% respiratory variability, suggesting right atrial pressure of 15 mmHg.   Comparison(s): Prior images reviewed side by side. The left ventricular  diastolic function is significantly worse. Elevated right and left atrial  pressures on the current study.   cMRI: 07/18/2016  IMPRESSION: 1) Hypertrophic cardiomyopathy with basal septum measuring 21 mm and spade-like ventrical   2) Hyperenhancement in the basal septum and apex   3) No frank SAM but small LVOT with peak recorded gradient 11 mmHg suggest echo correlation   4) Tri-leaflet aortic valve area by planimetry 2.0 cm2   5) Normal aortic root   6) Moderate LAE   7) Mild MR   8) Normal RV  CARDIAC CATH 05/07/2016:  Mid  RCA lesion, 20 %stenosed. 2nd Mrg lesion, 20 %stenosed. Prox Cx to Mid Cx lesion, 30 %stenosed. Mid LAD lesion, 60 %stenosed.   1. Mild to moderate non-obstructive CAD 2. The LAD is a large caliber vessel that reaches the apex. The mid LAD has a moderate eccentric stenosis just after the moderate caliber diagonal branch. Angiographically this appears to be moderate.  FFR of this lesion suggests the stenosis is not flow limiting. (FFR 0.82-0.85). 3. The RCA and Circumflex have minor disease. 4. Abnormal LV pressures c/w HOCM.   Recommendations: Medical management of moderate non-obstructive CAD.   Laboratory Data:  High Sensitivity Troponin:  No results for input(s): TROPONINIHS in the last 720 hours.   Chemistry Recent Labs  Lab 11/14/20 0237 11/15/20 0303 11/16/20 0003  NA 141 142 139  K 4.7 4.1 3.8  CL 108 109 109  CO2 21* 22 20*  GLUCOSE 118* 90 135*  BUN 25* 24* 29*  CREATININE 0.90 0.80 0.67  CALCIUM 8.4* 8.3* 8.0*  GFRNONAA >60 >60 >60  ANIONGAP 12 11 10     Recent Labs  Lab 11/13/20 1345  PROT 5.7*  ALBUMIN 3.1*  AST 48*  ALT 25  ALKPHOS 60  BILITOT 1.7*   Hematology Recent Labs  Lab 11/14/20 0237 11/15/20 0303 11/16/20 0003  WBC 17.2* 18.1* 17.2*  RBC 4.49 4.32 4.02  HGB 14.4 13.4 12.6  HCT 44.1 42.6 39.1  MCV 98.2 98.6 97.3  MCH 32.1 31.0 31.3  MCHC 32.7 31.5 32.2  RDW 18.4* 19.3* 19.2*  PLT 246 190 208   BNPNo results for input(s): BNP, PROBNP in the last 168 hours.  DDimer No results for input(s): DDIMER in the last 168 hours. Lab Results  Component Value Date   CHOL 209 (H) 11/13/2020   HDL 70 11/13/2020   LDLCALC 120 (H) 11/13/2020   TRIG 97 11/13/2020   CHOLHDL 3.0 11/13/2020   No results found for: TSH Lab Results  Component Value Date   HGBA1C 6.0 (H) 11/13/2020     Radiology/Studies:  DG Chest 1 View  Result Date: 11/14/2020 CLINICAL DATA:  Respiratory failure, endotracheal intubation EXAM: CHEST  1 VIEW COMPARISON:   11/13/2020 FINDINGS: Endotracheal tube seen 6.8 cm above the carina. Nasogastric tube extends into the upper abdomen beyond the margin of the examination. Pulmonary insufflation is normal and symmetric. Chronic interstitial changes are noted at the lung bases bilaterally. No superimposed confluent pulmonary infiltrate. No pneumothorax or pleural effusion. Prominent skin fold noted at the right apex. Capsular calcification related to right breast implant is again noted. Mild cardiomegaly is stable. Pulmonary vascularity is normal. IMPRESSION: Support tubes in appropriate position. Preserved pulmonary insufflation. Stable cardiomegaly. Electronically Signed   By: Helyn NumbersAshesh  Parikh MD   On: 11/14/2020 04:40  DG Chest 1 View  Result Date: 11/13/2020 CLINICAL DATA:  Intubation EXAM: CHEST  1 VIEW COMPARISON:  04/27/2020 x-ray, MRI 09/01/2018 FINDINGS: Endotracheal tube terminates approximately 4.8 cm above the carina. Mild cardiomegaly. Atherosclerotic calcification of the aortic knob. Diffusely increased interstitial markings within the lung bases. Area of lucency at the left costophrenic angle likely reflecting a skin fold. A small pneumothorax would be difficult to exclude. Osseous structures are demineralized. Chronic vertebral body height loss at approximately T7 compatible with site of prior discitis-osteomyelitis. IMPRESSION: 1. Endotracheal tube terminates approximately 4.8 cm above the carina. 2. Diffusely increased interstitial markings within the lung bases may reflect edema versus infection. 3. Area of relative lucency at the left costophrenic angle likely reflecting a skin fold. A small pneumothorax would be difficult to exclude. Recommend repeat chest x-ray after patient positioning, preferably upright. Electronically Signed   By: Duanne Guess D.O.   On: 11/13/2020 16:54   MR BRAIN WO CONTRAST  Result Date: 11/14/2020 CLINICAL DATA:  Neuro deficit, acute stroke suspected. EXAM: MRI HEAD WITHOUT  CONTRAST TECHNIQUE: Multiplanar, multiecho pulse sequences of the brain and surrounding structures were obtained without intravenous contrast. COMPARISON:  CT exams from yesterday. FINDINGS: Motion limited study.  Within this limitation: Brain: Acute infarcts involving the left basal ganglia (including the caudate and subinsular white matter), left insula, overlying left frontal lobe and high left parasagittal frontal lobe cortex, compatible with left ACA and MCA territory infarcts. Small areas of acute infarct in the left frontal lobe white matter and punctate left parietal cortical infarct, also left ACA and MCA territories. Associated edema without mass effect. Additional punctate acute infarcts in right frontal and parietal cortex. Small acute right cerebellar infarct. No hydrocephalus. No evidence of acute hemorrhage, mass lesion, midline shift. Remote right cerebellar lacunar infarct. Additional mild scattered T2 hyperintensities within the white matter, likely related to chronic microvascular ischemic disease. Vascular: Better evaluated on recent catheter arteriogram and CTA. Skull and upper cervical spine: Normal marrow signal. Sinuses/Orbits: Clear sinuses.  Unremarkable orbits. Other: No sizable mastoid effusions. IMPRESSION: Motion limited study. 1. 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 Associated edema without mass effect. 2. Additional scattered punctate acute infarcts in the right frontoparietal cortex and small acute infarct in the right cerebellum. Electronically Signed   By: Feliberto Harts MD   On: 11/14/2020 11:34   IR CT Head Ltd  Result Date: 11/15/2020 INDICATION: New onset left gaze deviation, right-sided hemiplegia, and global aphasia. 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 EXAM: 1. EMERGENT LARGE VESSEL OCCLUSION THROMBOLYSIS (anterior CIRCULATION) COMPARISON:  CT angiogram of the head and neck of November 13, 2020. MEDICATIONS: Ancef 2 g IV antibiotic was  administered within 1 hour of the procedure. ANESTHESIA/SEDATION: General anesthesia. CONTRAST:  Omnipaque 300 approximately 60 mL. FLUOROSCOPY TIME:  Fluoroscopy Time: 27 minutes 54 seconds (628 mGy). COMPLICATIONS: None immediate. TECHNIQUE: Following a full explanation of the procedure along with the potential associated complications, an informed witnessed consent was obtained. The risks of intracranial hemorrhage of 10%, worsening neurological deficit, ventilator dependency,death and inability to revascularize were all reviewed in detail with the patient's husband. The patient was then put under general anesthesia by the Department of Anesthesiology at [facility]. The left groin was prepped and draped in the usual sterile fashion. Thereafter using modified Seldinger technique, transfemoral access into the left common femoral artery was obtained without difficulty. Over a 0.035 inch guidewire an 8 [place] Pinnacle 10 cm sheath was inserted.  Through this, and also over a 0.035 inch guidewire a a 5.5 [place] 125 cm Berenstein support catheter inside of an 087 balloon guide catheter which had been prepped with 50% contrast and 50% heparinized saline infusion was advanced to the aortic arch region, and selectively positioned in the left common carotid artery just proximal to the left common carotid bifurcation. The support catheter, and the guidewire were removed. Good aspiration obtained from the hub of the balloon guide catheter. Arteriograms were then performed centered extra cranially and intracranially. FINDINGS: The left common carotid arteriogram demonstrates the left external carotid artery and its major branches to be widely patent. The left internal carotid artery at the bulb has mild to moderate stenosis due to calcified arteriosclerotic plaque. No evidence of intraluminal filling defects or of ulcerations was evident. More distally the left internal carotid artery is seen to ascend to the cranial  skull base. The petrous, cavernous and the supraclinoid segments are widely patent. The left anterior cerebral artery opacifies into the capillary and venous phases. Prompt opacification via the anterior communicating artery of the right anterior cerebral artery and the right middle cerebral artery is seen into the capillary and venous phases. The left middle cerebral artery demonstrates complete occlusion in the mid M1 segment. PROCEDURE: Over a 0.014 inch standard Synchro micro guidewire with a J-tip configuration, a combination of an 071 32 cm Zoom aspiration catheter with the balloon guide catheter were advanced to the supraclinoid left ICA. The balloon guide was situated at the cervical petrous junction. Using a torque device, access was obtained into the M2 M3 region with the micro guidewire, followed by the microcatheter. The guidewire was removed. Poor aspiration obtained from the hub of the microcatheter. This was then retrieved more proximally. 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 The micro guidewire and the microcatheter were retrieved more proximally. Using constant aspiration with Penumbra aspiration device at the hub of the Zoom aspiration catheter, and proximal flow arrest in the left internal carotid artery, the Zoom aspiration catheter was advanced more distally into the dominant inferior division branch. There it was maintained for approximately 2 minutes. Thereafter, the Zoom aspiration catheter was gently retrieved and removed. Aspiration was continued with a 20 mL syringe at the hub of the balloon guide catheter. Following flow reversal, a control arteriogram performed through the balloon guide catheter in the proximal left internal carotid artery demonstrated complete revascularization of the left middle cerebral artery into the capillary and venous phases. The distal MCA branches were widely patent without evidence of  intraluminal filling defects. The left anterior cerebral artery demonstrated wide patency with no change in the contralateral right anterior circulation via the anterior communicating artery. The balloon guide catheter was then removed. The right common carotid artery was then selected over an 0.035 inch Roadrunner guidewire with a 5 [place] diagnostic catheter. An arteriogram performed demonstrated severe stenosis at the origin of the right external carotid artery though its branches appeared widely patent. An eccentric smooth plaque was seen projecting in the right common carotid artery proximal to the bifurcation due to an anteriorly positioned plaque. Right internal carotid artery at the bulb is patent with delayed ascent of contrast to the cranial skull base consistent with angiographic string sign. CT of the brain demonstrated no evidence of intracranial hemorrhage or mass effect. The left femoral sheath was removed with hemostasis achieved with manual compression. Distal pulses remained palpable in both feet unchanged  at the end of the procedure. Patient was left intubated due to patient being unresponsive due to the stroke, and to protect the airway. Patient was then transferred to PACU and then neuro ICU for post revascularization management. 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 PLAN: Follow-up in the clinic 4 weeks post discharge. Electronically Signed   By: Julieanne Cotton M.D.   On: 11/14/2020 10:13   DG CHEST PORT 1 VIEW  Result Date: 11/13/2020 CLINICAL DATA:  Intubated, enteric catheter placement, CVA EXAM: PORTABLE CHEST 1 VIEW COMPARISON:  The 122 at 4:42 p.m. FINDINGS: 2 supine frontal views of the chest are obtained.  Endotracheal tube overlies tracheal air column, tip just below thoracic inlet. Enteric catheter passes below diaphragm tip excluded by collimation. Cardiac silhouette is enlarged but stable. Chronic central vascular congestion without airspace disease, effusion, or pneumothorax. Prominent skin fold overlies the right apex. IMPRESSION: 1. Support devices as above. 2. Chronic vascular congestion.  No acute airspace disease. Electronically Signed   By: Sharlet Salina M.D.   On: 11/13/2020 18:34   DG Abd Portable 1V  Result Date: 11/15/2020 CLINICAL DATA:  Feeding tube localization. EXAM: PORTABLE ABDOMEN - 1 VIEW COMPARISON:  11/13/2020 FINDINGS: Soft feeding tube enters the stomach with its tip in body antrum junction region. Gas pattern is unremarkable. IMPRESSION: Soft feeding tube in the stomach with its tip at the body antrum junction region. Electronically Signed   By: Paulina Fusi M.D.   On: 11/15/2020 13:26   DG Abd Portable 1V  Result Date: 11/13/2020 CLINICAL DATA:  Enteric catheter placement EXAM: PORTABLE ABDOMEN - 1 VIEW COMPARISON:  03/08/2015 FINDINGS: Frontal view of the abdomen and pelvis was obtained, excluding the lower pelvis by collimation. Enteric catheter passes below diaphragm, tip projecting over region of the gastric antrum. Bowel gas pattern is unremarkable. Excreted contrast within the kidneys from preceding interventional procedure. IMPRESSION: 1. Enteric catheter tip projecting over the gastric antrum. Electronically Signed   By: Sharlet Salina M.D.   On: 11/13/2020 18:32   ECHOCARDIOGRAM COMPLETE  Result Date: 11/14/2020    ECHOCARDIOGRAM REPORT   Patient Name:   ONIYAH ROHE Stoy Date of Exam: 11/14/2020 Medical Rec #:  [id number]       Height:       63.0 in Accession #:    [id number]      Weight:       113.3 lb Date of Birth:  1946-03-13       BSA:          1.519 m Patient Age:    75 years        BP:           121/72 mmHg Patient Gender: F               HR:           61 bpm. Exam  Location:  Inpatient Procedure: 2D Echo, Cardiac Doppler, Color Doppler and Strain Analysis Indications:    Stroke  History:        Patient has prior history of Echocardiogram examinations, most                 recent 12/29/2018. CAD; Risk Factors:Hypertension, Dyslipidemia  and Former Smoker. PAD. Hypertrophic cardiomyopathy.  Sonographer:    Ross Ludwig RDCS (AE) Referring Phys: [id number] [facility]  Sonographer Comments: Global longitudinal strain was attempted. IMPRESSIONS  1. There is no evidence of systolic anterior motion of the mitral valve or true LV outflow obstruction on this study. LVOT velocities are mildly elevated at 2 m/s. Left ventricular ejection fraction, by estimation, is 60 to 65%. The left ventricle has low normal function. The left ventricle has no regional wall motion abnormalities. There is moderate concentric left ventricular hypertrophy. Left ventricular diastolic parameters are consistent with Grade II diastolic dysfunction (pseudonormalization). Elevated left atrial pressure.  2. Right ventricular systolic function is normal. The right ventricular size is normal.  3. Left atrial size was severely dilated.  4. Right atrial size was mildly dilated.  5. The mitral valve is degenerative. Mild to moderate mitral valve regurgitation.  6. The aortic valve is tricuspid. There is moderate calcification of the aortic valve. There is mild thickening of the aortic valve. Aortic valve regurgitation is mild. Mild to moderate aortic valve sclerosis/calcification is present, without any evidence of aortic stenosis.  7. The inferior vena cava is dilated in size with <50% respiratory variability, suggesting right atrial pressure of 15 mmHg. Comparison(s): Prior images reviewed side by side. The left ventricular diastolic function is significantly worse. Elevated right and left atrial pressures on the current study. FINDINGS  Left Ventricle: There is no evidence of systolic anterior  motion of the mitral valve or true LV outflow obstruction on this study. LVOT velocities are mildly elevated at 2 m/s. Left ventricular ejection fraction, by estimation, is 60 to 65%. The left ventricle has low normal function. The left ventricle has no regional wall motion abnormalities. The left ventricular internal cavity size was normal in size. There is moderate concentric left ventricular hypertrophy. Left ventricular diastolic parameters are consistent with Grade II diastolic dysfunction (pseudonormalization). Elevated left atrial pressure. Right Ventricle: The right ventricular size is normal. No increase in right ventricular wall thickness. Right ventricular systolic function is normal. Left Atrium: Left atrial size was severely dilated. Right Atrium: Right atrial size was mildly dilated. Pericardium: There is no evidence of pericardial effusion. Mitral Valve: The mitral valve is degenerative in appearance. There is mild thickening of the mitral valve leaflet(s). Mild to moderate mitral annular calcification. Mild to moderate mitral valve regurgitation, with centrally-directed jet. MV peak gradient, 12.1 mmHg. The mean mitral valve gradient is 4.0 mmHg. Tricuspid Valve: The tricuspid valve is normal in structure. Tricuspid valve regurgitation is not demonstrated. Aortic Valve: The aortic valve is tricuspid. There is moderate calcification of the aortic valve. There is mild thickening of the aortic valve. Aortic valve regurgitation is mild. Mild to moderate aortic valve sclerosis/calcification is present, without any evidence of aortic stenosis. Aortic valve mean gradient measures 7.0 mmHg. Aortic valve peak gradient measures 14.3 mmHg. Aortic valve area, by VTI measures 3.95 cm. Pulmonic Valve: The pulmonic valve was normal in structure. Pulmonic valve regurgitation is mild. Aorta: The aortic root and ascending aorta are structurally normal, with no evidence of dilitation. Venous: The inferior vena cava is  dilated in size with less than 50% respiratory variability, suggesting right atrial pressure of 15 mmHg. IAS/Shunts: No atrial level shunt detected by color flow Doppler.  LEFT VENTRICLE PLAX 2D LVIDd:         3.55 cm  Diastology LVIDs:         2.30 cm  LV e' medial:    3.92  cm/s LV PW:         1.42 cm  LV E/e' medial:  42.1 LV IVS:        1.34 cm  LV e' lateral:   5.87 cm/s LVOT diam:     2.14 cm  LV E/e' lateral: 28.1 LV SV:         144 LV SV Index:   95       2D Longitudinal Strain LVOT Area:     3.60 cm 2D Strain GLS Avg:     -16.7 %  RIGHT VENTRICLE             IVC RV Basal diam:  3.10 cm     IVC diam: 1.50 cm RV S prime:     14.40 cm/s TAPSE (M-mode): 2.3 cm LEFT ATRIUM              Index       RIGHT ATRIUM           Index LA diam:        4.90 cm  3.23 cm/m  RA Area:     19.70 cm LA Vol (A2C):   71.9 ml  47.34 ml/m RA Volume:   49.70 ml  32.72 ml/m LA Vol (A4C):   120.0 ml 79.01 ml/m LA Biplane Vol: 92.7 ml  61.03 ml/m  AORTIC VALVE AV Area (Vmax):    3.86 cm AV Area (Vmean):   3.95 cm AV Area (VTI):     3.95 cm AV Vmax:           189.00 cm/s AV Vmean:          124.000 cm/s AV VTI:            0.365 m AV Peak Grad:      14.3 mmHg AV Mean Grad:      7.0 mmHg LVOT Vmax:         203.00 cm/s LVOT Vmean:        136.000 cm/s LVOT VTI:          0.401 m LVOT/AV VTI ratio: 1.10  AORTA Ao Root diam: 3.40 cm MITRAL VALVE MV Area (PHT): 2.24 cm     SHUNTS MV Peak grad:  12.1 mmHg    Systemic VTI:  0.40 m MV Mean grad:  4.0 mmHg     Systemic Diam: 2.14 cm MV Vmax:       1.74 m/s MV Vmean:      90.1 cm/s MV Decel Time: 338 msec MV E velocity: 165.00 cm/s MV A velocity: 114.00 cm/s MV E/A ratio:  1.45 Mihai Croitoru MD Electronically signed by Thurmon Fair MD Signature Date/Time: 11/14/2020/12:03:41 PM    Final    IR PERCUTANEOUS ART THROMBECTOMY/INFUSION INTRACRANIAL INC DIAG ANGIO  Result Date: 11/15/2020 INDICATION: New onset left gaze deviation, right-sided hemiplegia, and global aphasia. Occluded left  middle cerebral artery M1 segment. EXAM: 1. EMERGENT LARGE VESSEL OCCLUSION THROMBOLYSIS (anterior CIRCULATION) COMPARISON:  CT angiogram of the head and neck of November 13, 2020. MEDICATIONS: Ancef 2 g IV antibiotic was administered within 1 hour of the procedure. ANESTHESIA/SEDATION: General anesthesia. CONTRAST:  Omnipaque 300 approximately 60 mL. FLUOROSCOPY TIME:  Fluoroscopy Time: 27 minutes 54 seconds (628 mGy). COMPLICATIONS: None immediate. TECHNIQUE: Following a full explanation of the procedure along with the potential associated complications, an informed witnessed consent was obtained. The risks of intracranial hemorrhage of 10%, worsening neurological deficit, ventilator dependency,death and inability to revascularize were all reviewed in detail with  the patient's husband. The patient was then put under general anesthesia by the Department of Anesthesiology at [facility]. The left groin was prepped and draped in the usual sterile fashion. Thereafter using modified Seldinger technique, transfemoral access into the left common femoral artery was obtained without difficulty. Over a 0.035 inch guidewire an 8 [place] Pinnacle 10 cm sheath was inserted. Through this, and also over a 0.035 inch guidewire a a 5.5 [place] 125 cm Berenstein support catheter inside of an 087 balloon guide catheter which had been prepped with 50% contrast and 50% heparinized saline infusion was advanced to the aortic arch region, and selectively positioned in the left common carotid artery just proximal to the left common carotid bifurcation. The support catheter, and the guidewire were removed. Good aspiration obtained from the hub of the balloon guide catheter. Arteriograms were then performed centered extra cranially and intracranially. FINDINGS: The left common carotid arteriogram demonstrates the left external carotid artery and its major branches to be widely patent. The left internal carotid artery at the bulb has mild  to moderate stenosis due to calcified arteriosclerotic plaque. No evidence of intraluminal filling defects or of ulcerations was evident. More distally the left internal carotid artery is seen to ascend to the cranial skull base. The petrous, cavernous and the supraclinoid segments are widely patent. The left anterior cerebral artery opacifies into the capillary and venous phases. Prompt opacification via the anterior communicating artery of the right anterior cerebral artery and the right middle cerebral artery is seen into the capillary and venous phases. The left middle cerebral artery demonstrates complete occlusion in the mid M1 segment. PROCEDURE: Over a 0.014 inch standard Synchro micro guidewire with a J-tip configuration, a combination of an 071 32 cm Zoom aspiration catheter with the balloon guide catheter were advanced to the supraclinoid left ICA. The balloon guide was situated at the cervical petrous junction. Using a torque device, access was obtained into the M2 M3 region with the micro guidewire, followed by the microcatheter. The guidewire was removed. Poor aspiration obtained from the hub of the microcatheter. This was then retrieved more proximally. 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 The micro guidewire and the microcatheter were retrieved more proximally. Using constant aspiration with Penumbra aspiration device at the hub of the Zoom aspiration catheter, and proximal flow arrest in the left internal carotid artery, the Zoom aspiration catheter was advanced more distally into the dominant inferior division branch. There it was maintained for approximately 2 minutes. Thereafter, the Zoom aspiration catheter was gently retrieved and removed. Aspiration was continued with a 20 mL syringe at the hub of the balloon guide catheter. Following flow reversal, a control arteriogram performed through the balloon guide catheter in  the proximal left internal carotid artery demonstrated complete revascularization of the left middle cerebral artery into the capillary and venous phases. The distal MCA branches were widely patent without evidence of intraluminal filling defects. The left anterior cerebral artery demonstrated wide patency with no change in the contralateral right anterior circulation via the anterior communicating artery. The balloon guide catheter was then removed. The right common carotid artery was then selected over an 0.035 inch Roadrunner guidewire with a 5 [place] diagnostic catheter. An arteriogram performed demonstrated severe stenosis at the origin of the right external carotid artery though its branches appeared widely patent. An eccentric smooth plaque was seen projecting in the right common carotid artery proximal to the bifurcation  due to an anteriorly positioned plaque. Right internal carotid artery at the bulb is patent with delayed ascent of contrast to the cranial skull base consistent with angiographic string sign. CT of the brain demonstrated no evidence of intracranial hemorrhage or mass effect. The left femoral sheath was removed with hemostasis achieved with manual compression. Distal pulses remained palpable in both feet unchanged at the end of the procedure. Patient was left intubated due to patient being unresponsive due to the stroke, and to protect the airway. Patient was then transferred to PACU and then neuro ICU for post revascularization management. 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 PLAN: Follow-up in the clinic 4 weeks post discharge. Electronically Signed   By: Julieanne Cotton M.D.   On: 11/14/2020 10:13   CT HEAD CODE STROKE  WO CONTRAST  Result Date: 11/13/2020 CLINICAL DATA:  Code stroke. EXAM: CT HEAD WITHOUT CONTRAST TECHNIQUE: Contiguous axial images were obtained from the base of the skull through the vertex without intravenous contrast. COMPARISON:  None. FINDINGS: Brain: Asymmetric hypodensity of the left insula, concerning for acute left MCA territory infarct. No acute hemorrhage, hydrocephalus, extra-axial collection or mass lesion/mass effect. Remote infarcts versus dilated perivascular spaces in bilateral inferior basal ganglia. Mild additional patchy white matter hypoattenuation, most likely related to chronic microvascular ischemic disease. Suspected remote lacunar infarct in the right cerebellum. Vascular: Hyperdense appearance of the distal left M1 MCA. Skull: No acute fracture. Sinuses/Orbits: Clear sinuses.  Unremarkable orbits. Other: No mastoid effusions. ASPECTS [facility] ([facility]) Stroke Program Early CT Score) - Ganglionic level infarction (caudate, lentiform nuclei, internal capsule, insula, M1-M3 cortex): 6 - Supraganglionic infarction (M4-M6 cortex): 3 Total score (0-10 with 10 being normal): 9 IMPRESSION: 1. Mild asymmetric hypodensity of the left insula, concerning for acute left MCA territory infarct. An MRI could further evaluate. 2. Hyperdense appearance of the distal left M1 MCA, concerning for thrombus. Recommend CTA to further evaluate. 3. No acute hemorrhage. 4. Remote lacunar infarcts versus dilated perivascular spaces in bilateral inferior basal ganglia. Suspected remote lacunar infarct in the right cerebellum. Finding discussed with Dr. Derry Lory via telephone at 1:55 p.m. Electronically Signed   By: Feliberto Harts MD   On: 11/13/2020 14:03   CT ANGIO HEAD NECK W WO CM W PERF (CODE STROKE)  Result Date: 11/13/2020 EXAM: CT ANGIOGRAPHY HEAD AND NECK CT PERFUSION BRAIN TECHNIQUE: Multidetector CT imaging of the head and neck was performed using the standard protocol during bolus administration of  intravenous contrast. Multiplanar CT image reconstructions and MIPs were obtained to evaluate the vascular anatomy. Carotid stenosis measurements (when applicable) are obtained utilizing NASCET criteria, using the distal internal carotid diameter as the denominator. Multiphase CT imaging of the brain was performed following IV bolus contrast injection. Subsequent parametric perfusion maps were calculated using RAPID software. CONTRAST:  OMNIPAQUE IOHEXOL 350 MG/ML SOLN COMPARISON:  None. FINDINGS: CTA HEAD FINDINGS Anterior circulation: Left ICA is patent with mild to moderate calcific atherosclerosis. Right ICA is non-opacified to the carotid terminus. The right MCA and right posterior communicating artery are opacified, likely the of the right A1 ACA. There is abrupt occlusion of the left M1 MCA with very little opacification of distal MCA branches. Left A1 and A2 ACAs are patent. Loss of opacification of the distal left ACA (  A3/A4) on series 7, images 299 through 302, concerning for high-grade stenosis or occlusion. Posterior circulation: Moderate stenosis of the proximal intradural left vertebral artery due to predominately calcific atherosclerosis. Non dominant right intradural vertebral artery. The basilar artery bilateral posterior cerebral arteries are patent without evidence of proximal flow limiting stenosis. Prominent right posterior communicating artery with hypoplastic or absent right A1 ACA, anatomic variant. Venous sinuses: As permitted by contrast timing, patent. Anatomic variants: As detailed above. CTA NECK FINDINGS Aortic arch: Atherosclerosis.  Great vessel origins are patent. Right carotid system: Patent common carotid artery. There is mixed calcific and noncalcific atherosclerosis at the carotid bifurcation with occlusion of the proximal internal carotid artery. The remainder of the ICA is non-opacified. Left carotid system: Patent common carotid artery. Calcific and noncalcific  atherosclerosis at the carotid bifurcation with approximately 40-50% stenosis Vertebral arteries: Left dominant. Severe stenosis of the non dominant right vertebral artery origins. Skeleton: Severe degenerative disease at C5-C6. Other neck: No acute findings.  Severe emphysema. Upper chest: Severe emphysema. Review of the MIP images confirms the above findings CT Brain Perfusion Findings: ASPECTS: 9 CBF (<30%) Volume: 41mL Perfusion (Tmax>6.0s) volume: Mismatch Volume: Infarction Location:Anterior left MCA territory IMPRESSION: 1. Occlusion of the left M1 MCA with very little opacification of distal left MCA branches. Perfusion demonstrates the core infarct of 41 mL in the anterior left MCA territory with large (122 ml) of ischemic phenomena in the remaining left MCA and potentially left ACA territories. 2. Age indeterminate occlusion of the proximal right internal carotid artery in the neck with non-opacification through the carotid terminus. The right MCA and right posterior communicating artery are opacified via the of the right A1 ACA. 3. Suspected high-grade stenosis versus occlusion of the distal left A3/A4 ACA. 4. Approximately 40-50% stenosis of the proximal left ICA in the neck. 5. Severe stenosis of the non dominant/small right vertebral artery origin. Moderate stenosis of the intradural left vertebral artery. 6. Severe emphysema. Critical findings discussed with Dr. Derry Lory at 1:57 p.m. Electronically Signed   By: Feliberto Harts MD   On: 11/13/2020 14:30   IR ANGIO INTRA EXTRACRAN SEL COM CAROTID INNOMINATE UNI L MOD SED  INDICATION: New onset left gaze deviation, right-sided hemiplegia, and global aphasia. 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   EXAM: 1. EMERGENT LARGE VESSEL OCCLUSION THROMBOLYSIS (anterior CIRCULATION)   COMPARISON:  CT angiogram of the head and neck of November 13, 2020.   MEDICATIONS: Ancef 2 g IV antibiotic was administered within 1 hour of the  procedure.   ANESTHESIA/SEDATION: General anesthesia.   CONTRAST:  Omnipaque 300 approximately 60 mL.   FLUOROSCOPY TIME:  Fluoroscopy Time: 27 minutes 54 seconds (628 mGy).   COMPLICATIONS: None immediate.   TECHNIQUE: Following a full explanation of the procedure along with the potential associated complications, an informed witnessed consent was obtained. The risks of intracranial hemorrhage of 10%, worsening neurological deficit, ventilator dependency,death and inability to revascularize were all reviewed in detail with the patient's husband.   The patient was then put under general anesthesia by the Department of Anesthesiology at [facility].   The left groin was prepped and draped in the usual sterile fashion. Thereafter using modified Seldinger technique, transfemoral access into the left common femoral artery was obtained without difficulty. Over a 0.035 inch guidewire an 8 [place] Pinnacle 10 cm sheath was inserted. Through this, and also over a 0.035 inch guidewire a a 5.5 [place] 125 cm Berenstein support catheter inside of  an 087 balloon guide catheter which had been prepped with 50% contrast and 50% heparinized saline infusion was advanced to the aortic arch region, and selectively positioned in the left common carotid artery just proximal to the left common carotid bifurcation. The support catheter, and the guidewire were removed. Good aspiration obtained from the hub of the balloon guide catheter. Arteriograms were then performed centered extra cranially and intracranially.   FINDINGS: The left common carotid arteriogram demonstrates the left external carotid artery and its major branches to be widely patent.   The left internal carotid artery at the bulb has mild to moderate stenosis due to calcified arteriosclerotic plaque. No evidence of intraluminal filling defects or of ulcerations was evident.   More distally the left internal carotid artery is seen to ascend to the cranial skull base.  The petrous, cavernous and the supraclinoid segments are widely patent. The left anterior cerebral artery opacifies into the capillary and venous phases.   Prompt opacification via the anterior communicating artery of the right anterior cerebral artery and the right middle cerebral artery is seen into the capillary and venous phases.   The left middle cerebral artery demonstrates complete occlusion in the mid M1 segment.   PROCEDURE: Over a 0.014 inch standard Synchro micro guidewire with a J-tip configuration, a combination of an 071 32 cm Zoom aspiration catheter with the balloon guide catheter were advanced to the supraclinoid left ICA. The balloon guide was situated at the cervical petrous junction.   Using a torque device, access was obtained into the M2 M3 region with the micro guidewire, followed by the microcatheter. The guidewire was removed. Poor aspiration obtained from the hub of the microcatheter. This was then retrieved more proximally. 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 The micro guidewire and the microcatheter were retrieved more proximally.   Using constant aspiration with Penumbra aspiration device at the hub of the Zoom aspiration catheter, and proximal flow arrest in the left internal carotid artery, the Zoom aspiration catheter was advanced more distally into the dominant inferior division branch. There it was maintained for approximately 2 minutes. Thereafter, the Zoom aspiration catheter was gently retrieved and removed. Aspiration was continued with a 20 mL syringe at the hub of the balloon guide catheter. Following flow reversal, a control arteriogram performed through the balloon guide catheter in the proximal left internal carotid artery demonstrated complete revascularization of the left middle cerebral artery into the capillary and venous phases. The distal MCA branches were widely patent without evidence of  intraluminal filling defects.   The left anterior cerebral artery demonstrated wide patency with no change in the contralateral right anterior circulation via the anterior communicating artery.   The balloon guide catheter was then removed. The right common carotid artery was then selected over an 0.035 inch Roadrunner guidewire with a 5 [place] diagnostic catheter. An arteriogram performed demonstrated severe stenosis at the origin of the right external carotid artery though its branches appeared widely patent.   An eccentric smooth plaque was seen projecting in the right common carotid artery proximal to the bifurcation due to an anteriorly positioned plaque.   Right internal carotid artery at the bulb is patent with delayed ascent of contrast to the cranial skull base consistent with angiographic string sign.   CT of the brain demonstrated no evidence of intracranial hemorrhage or mass effect.   The left femoral sheath was removed with hemostasis achieved with manual compression.  Distal pulses remained palpable in both feet unchanged at the end of the procedure.   Patient was left intubated due to patient being unresponsive due to the stroke, and to protect the airway.   Patient was then transferred to PACU and then neuro ICU for post revascularization management.   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   PLAN: Follow-up in the clinic 4 weeks post discharge.     Electronically Signed   By: Julieanne Cotton M.D.   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 Corliss Skains.  - continue tube feeds and meds per tube - per Stroke MD  3. Abd hematoma - reportedly developed after coughing - Eliquis stopped in hospital in [place] - hematoma has greatly improved - follow on heparin  Otherwise, per Stroke MD  Risk Assessment/Risk Scores:     CHA2DS2-VASc Score = 7  This indicates a 11.2% annual risk of stroke. The patient's score is based upon: CHF History: No HTN History: Yes Diabetes History: No Stroke History: Yes Vascular Disease History: Yes Age Score: 2 Gender Score: 1    For questions or updates, please contact [facility] Please consult [email or web address] for contact info under    Signed, Theodore Demark, PA-C  11/16/2020 3:09 PM  History and all data above reviewed.  Patient examined.  I agree with the findings as above.   The patient is admitted with a CVA and dense hemiparesis.  We are called secondary to atrial fib with a rapid ventricular response that was not initially not responding to appropriate therapy.  The patient [place] not able to give a history.  We are calling her husband to further understand the recent outside hospital events.  She had an abdominal wall hematoma and anticoagulation was stopped appropriately with subsequent CVA.  The patient exam reveals WUJ:[id number], no murmurs  ,  Lungs: Clear  ,  Abd: Positive bowel sounds, no rebound no guarding with extensive abdominal wall ecchymosis, Ext Mild  non pitting edema.    .  All available labs, radiology testing, previous records reviewed. Agree with documented assessment and plan.   Atrial fib:  Rate is better controlled.  Continue IV amiodarone for now and continue heparin.  We are going to get the outside records to understand the apparent spontaneous bleed that she had and to understand the timing of restarting the DOAC.    Beverly Li  4:32 PM  11/16/2020

## 2020-11-16 NOTE — Progress Notes (Signed)
Mg 1.8 Replaced per protocol  

## 2020-11-16 NOTE — Progress Notes (Signed)
  Speech Language Pathology Treatment: Dysphagia;Cognitive-Linquistic  Patient Details Name: Beverly Li MRN: [id number] DOB: 1945/12/27 Today's Date: 11/16/2020 Time: 1050-1120 SLP Time Calculation (min) (ACUTE ONLY): 30 min  Assessment / Plan / Recommendation Clinical Impression  Pt demonstrates improved arousal today; she kept eyes open through session and appeared more attentive. Though pt did not follow verbal only commands she did initiate some purposeful motor movements with heavy multimodal cueing. Pt seems to have significant difficulty initiating oral motor movement and other communicative movements (pointing, nodding, directing gaze, etc) making true assessment of her comprehension difficult. Context and tactile cues for initiation do help. Today pt did open her mouth for a bite of puree with visual tracking of spoon. She did not transit the bolus or initiate swallows. Bolus removed via suction. Will continue efforts.    HPI HPI: 75 year old female with proximal A. 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 Intubated 8/1, extubated 8/2. MRI shows Acute infarcts involving the left basal ganglia, left insula, overlying  left frontal lobe and high left parasagittal frontal lobe cortex,  compatible with left ACA and MCA territory infarcts. Small areas of  acute infarct in the left frontal lobe white matter and punctate  left parietal cortical infarct, also left ACA and MCA territories.  Associated edema without mass effect. Additional punctate acute  infarcts in right frontal and parietal cortex. Small acute right  cerebellar infarct.      SLP Plan  Continue with current plan of care       Recommendations  Diet recommendations: NPO                Follow up Recommendations: Inpatient Rehab SLP Visit Diagnosis: Dysphagia, oropharyngeal phase (R13.12) Plan: Continue with current plan of care       GO               Harlon Ditty, MA CCC-SLP  Acute  Rehabilitation Services Pager [phone] Office [phone]  Claudine Mouton 11/16/2020, 1:06 PM

## 2020-11-16 NOTE — Progress Notes (Signed)
ANTICOAGULATION CONSULT NOTE - Follow Up Consult  Pharmacy Consult for heparin Indication: atrial fibrillation and stroke  Labs: Recent Labs    11/13/20 1345 11/13/20 1351 11/14/20 0237 11/15/20 0303 11/15/20 1633 11/16/20 0003  HGB 14.1   < > 14.4 13.4  --  12.6  HCT 44.2   < > 44.1 42.6  --  39.1  PLT 240  --  246 190  --  208  APTT 27  --   --   --   --   --   LABPROT 12.9  --  12.3 12.6  --  13.9  INR 1.0  --  0.9 1.0  --  1.1  HEPARINUNFRC  --   --   --   --  0.12* <0.10*  CREATININE 0.77   < > 0.90 0.80  --  0.67   < > = values in this interval not displayed.    Assessment: 75yo female subtherapeutic on heparin with lower heparin level despite increased rate; no infusion issues other than an occluded line early in her shift so heparin was paused x36min, no signs of bleeding per RN.  Goal of Therapy:  Heparin level 0.3-0.5 units/ml   Plan:  Will increase heparin infusion by 3 units/kg/hr to 850 units/hr and check level in 8 hours.    Vernard Gambles, PharmD, BCPS  11/16/2020,1:30 AM

## 2020-11-16 NOTE — Progress Notes (Signed)
ANTICOAGULATION CONSULT NOTE  Pharmacy Consult for heparin Indication: atrial fibrillation  Patient Measurements: Height: 5\' 3"  (160 cm) Weight: 54.3 kg (119 lb 11.4 oz) IBW/kg (Calculated) : 52.4 Heparin Dosing Weight: 51 kg   Vital Signs: Temp: 98.5 F (36.9 C) (08/04 2000) Temp Source: Oral (08/04 2000) BP: 140/88 (08/04 1800) Pulse Rate: 91 (08/04 1800)  Labs: Recent Labs    11/14/20 0237 11/15/20 0303 11/15/20 1633 11/16/20 0003 11/16/20 1056 11/16/20 2044  HGB 14.4 13.4  --  12.6  --   --   HCT 44.1 42.6  --  39.1  --   --   PLT 246 190  --  208  --   --   LABPROT 12.3 12.6  --  13.9  --   --   INR 0.9 1.0  --  1.1  --   --   HEPARINUNFRC  --   --    < > <0.10* 0.15* 0.20*  CREATININE 0.90 0.80  --  0.67  --   --    < > = values in this interval not displayed.     Estimated Creatinine Clearance: 50.3 mL/min (by C-G formula based on SCr of 0.67 mg/dL).   Assessment: 86 YOF with h/o Afib on Eliquis PTA who presented with an MCA occlusion and is s/p successful mechanical thrombectomy. Of note, patient's Eliquis was on hold ~ 1 week prior to admission due to recent hematoma. Pharmacy consulted to dose IV heparin for AFib.   Heparin level SUBtherapeutic this evening but trending up (HL 0.2 << 0.15, goal of 0.3-0.5). No bleeding or infusions issues noted per discussion with RN. Old abd hematoma site stable.  Goal of Therapy:  Heparin level 0.3-0.5 units/ml Monitor platelets by anticoagulation protocol: Yes   Plan:  - Increase Heparin to 1050 units/hr (10.5 ml/hr) - Will continue to monitor for any signs/symptoms of bleeding and will follow up with heparin level in 8 hours   Thank you for allowing pharmacy to be a part of this patient's care.  61, PharmD, BCPS Clinical Pharmacist Clinical phone for 11/16/2020: 01/16/2021 11/16/2020 9:16 PM   **Pharmacist phone directory can now be found on [email or web address] ([email or web address]).  Listed under [facility].

## 2020-11-16 NOTE — Progress Notes (Signed)
STROKE TEAM PROGRESS NOTE    Interval History  Patient is sitting up in bed.  She remains alert severe global aphasia and right hemiplegia.  She is trying to visually track today and follows only occasional simple one-step commands.  She is still unable to speak any words.  Blood pressure adequately controlled.  Heart rate controlled on amiodarone drip.  Son and daughter are at the bedside. Pertinent Lab Work and Imaging    11/13/20 CT Head WO IV Contrast 1. Mild asymmetric hypodensity of the left insula, concerning for acute left MCA territory infarct. An MRI could further evaluate. 2. Hyperdense appearance of the distal left M1 MCA, concerning for thrombus. Recommend CTA to further evaluate. 3. No acute hemorrhage. 4. Remote lacunar infarcts versus dilated perivascular spaces in bilateral inferior basal ganglia. Suspected remote lacunar infarct in the right cerebellum.  11/13/20 CT Angio Head and Neck W WO IV Contrast 1. Occlusion of the left M1 MCA with very little opacification of distal left MCA branches. Perfusion demonstrates the core infarct of 41 mL in the anterior left MCA territory with large (122 ml) of ischemic phenomena in the remaining left MCA and potentially left ACA territories. 2. Age indeterminate occlusion of the proximal right internal carotid artery in the neck with non-opacification through the carotid terminus. The right MCA and right posterior communicating artery are opacified via the of the right A1 ACA. 3. Suspected high-grade stenosis versus occlusion of the distal left A3/A4 ACA. 4. Approximately 40-50% stenosis of the proximal left ICA in the neck. 5. Severe stenosis of the non dominant/small right vertebral artery origin. Moderate stenosis of the intradural left vertebral artery. 6. Severe emphysema.  11/14/20 MRI Brain WO IV Contrast 1. 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 Associated edema without mass effect. 2. Additional scattered  punctate acute infarcts in the right frontoparietal cortex and small acute infarct in the right cerebellum.  11/14/20 Echocardiogram Complete   1. There is no evidence of systolic anterior motion of the mitral valve or true LV outflow obstruction on this study. LVOT velocities are mildly elevated at 2 m/s. Left ventricular ejection fraction, by estimation, is 60 to 65%. The left ventricle has low normal function. The left ventricle has no regional wall motion abnormalities. There is moderate concentric left ventricular hypertrophy. Left ventricular diastolic parameters are consistent with Grade II diastolic dysfunction (pseudonormalization). Elevated left atrial pressure.   2. Right ventricular systolic function is normal. The right ventricular size is normal.   3. Left atrial size was severely dilated.   4. Right atrial size was mildly dilated.   5. The mitral valve is degenerative. Mild to moderate mitral valve regurgitation.   6. The aortic valve is tricuspid. There is moderate calcification of the aortic valve. There is mild thickening of the aortic valve. Aortic valve regurgitation is mild. Mild to moderate aortic valve  sclerosis/calcification is present, without any evidence of aortic stenosis.   7. The inferior vena cava is dilated in size with <50% respiratory variability, suggesting right atrial pressure of 15 mmHg.   Physical Examination   Constitutional: Elderly Caucasian female resting in bed, intubated and sedated  Cardiovascular: Normal RR Respiratory: No increased WOB   Mental status: Unable to answer LOC questions due to severe global aphasia, does not follow commands opens eyes but is mute Speech:  UTA  Cranial nerves: EOMI with skewed gaze( right eye is p slightly hypertropic, Decreased BTT on the right, UTA face symmetry due to  ET tube, UTA tongue due to lack of command following  Motor: Normal bulk and tone. RUE plegic, RLE withdraws subtly to noxious stimulation. LUE  antigravity, she is able to maintain her left leg up when it is flexed in the bed by the examiner  Sensory: Decreased sensation to the right  Coordination: UTA  Gait: Deferred due to right sided weakness   Assessment and Plan   [name] is a 75 y.o. female w/pmh of AFib on Eliquis (off x 1 week), COPD, remote tobacco abuse, hypertrophic cardiomyopathy, aortic stenosis, PAD, anxiety, and GERD who presents with right sided weakness, right facial droop, and left gaze preference. She did not qualify for IVTPA but underwent thrombectomy with TICI 3 revascularization   NEURO # LMCA Stroke status post mechanical thrombectomy with TICI 3 revascularization in the setting of atrial fibrillation( taken off anticoagulation due to hematoma)   Patient presented with the symptoms described above. At this time, her stroke work up is complete. CTH w/L MCA stroke. MRI Brain revealed left MCA and ACA stroke. CTA Head and Neck showed occlusion of the left MCA and an age indeterminate occlusion of the proximal right internal carotid artery in the neck with non-opacification through the carotid terminus. Echo w/EF 60 to 65 %, left atrium severely dilated. Stroke labs w/LDL 120 + hemoglobin a1c 6.0. Stroke etiology is atrial fibrillation off of anticoagulation due to hematoma.  - Holding ASA due to hematoma  - Atorvastatin 40 for stroke prevention when NGT is placed or she passes swallow eval  - Will place referral at discharge for stroke follow up   CARDS #Hypertension She has a history of HTN and atrial fibrillation; she takes Diltiazem 30 mg QD, Nebivolol 10 mg QD and Hydralazine 25 mg Q8 at home. Currently blood pressure is normotensive. Are managing blood pressure with Cleviprex at this time.  - SBP goal 120-140 given thrombectomy   #Hyperlipidemia From a stroke prevention stand point, the LDL goal is < 70. LDL is 120, will start Atorvastatin 40 mg when enteral access is obtained.   RESP #Hypoxic  Respiratory Failure  #History of COPD  Was intubated due to respiratory distress, extubated on 11/14/20. Extubated to 4L [place].  - CCM managing   RENAL Trending lytes and renal function, BUN elevated are trending   GI Currently NPO, will likely need NGT and possibly peg tube  ENDO #Stroke Diabetes Screening  Hemoglobin A1C this admission noted to be 6, prediabetic range. Managing hyperglycemia with SSI this admission.   HEME #Hematoma  Hemoglobin hematocrit and platelet count stable. She was recently hospitalized for a hematoma that developed due to coughing + Eliquis use.  ID Afebrile. No infectious processes at this time.   Hospital day # 3     Continue IV heparin given she has atrial fibrillation and the stroke occurred when anticoagulation was heldfor abdominal wall bruising.  Continue mobilize out of bed and ongoing therapy consults.  Rehab consult.  Continue core track tube feeding till she is able to swallow.  Resumed home rate control medications and taper off amiodarone drip.   Cardiology consult to help manage her rate control medications.  Discussed with Dr. Merrily Pew critical care medicine.  Long discussion with the patient's daughter and son at the bedside and answered questions about her prognosis, evaluation and treatment plan and discussion with care team. This patient is critically ill and at significant risk of neurological worsening, death and care requires constant monitoring of vital signs, hemodynamics,respiratory  and cardiac monitoring, extensive review of multiple databases, frequent neurological assessment, discussion with family, other specialists and medical decision making of high complexity.I have made any additions or clarifications directly to the above note.This critical care time does not reflect procedure time, or teaching time or supervisory time of PA/NP/Med Resident etc but could involve care discussion time.  I spent 32 minutes of neurocritical care time  in  the care of  this patient.      Delia Heady, MD Medical Director [facility] St[facility] Pager: [phone] 11/16/2020 1:16 PM    To contact Stroke Continuity provider, please refer to [email or web address]. After hours, contact General Neurology

## 2020-11-16 NOTE — TOC Initial Note (Signed)
Transition of Care [facility]) - Initial/Assessment Note    Patient Details  Name: Beverly Li MRN: [id number] Date of Birth: 11/02/1945  Transition of Care [facility]) CM/SW Contact:    Mearl Latin, LCSW Phone Number: 11/16/2020, 3:44 PM  Clinical Narrative:                 CSW following patient for discharge planning needs. CSW received consult for possible SNF placement at time of discharge. CSW spoke with patient's spouse and explained PT recommendation of SNF. He requested patient go to inpatient rehab. CSW explained that CIR has screened patient and she does not currently meet criteria for tolerance but will continue to assess patient. Spouse stated if patient has to go to SNF, he only wants one with high ratings but he will continue to advocate for CIR. Patient has received the COVID vaccines. No further questions reported at this time.   Skilled Nursing Rehab Facilities-   [email or web address] *Ratings updated quarterly   Ratings out of 5 possible   Name Address  Phone # Quality Care Staffing Health Inspection Overall  [facility] [address] [place] [phone] 5  4 4   [facility]  [address], [place] [phone] 4 2 4 4   [facility] [address] [place], [address] [place] 2 3 1 1   [facility] [address] 3 3 4 4   [facility] [address] [phone] 3  2 2   [facility] [address], [place] [phone] 2 2 4 4   [facility] [address] [place] 5 2 2 3   [facility] [address], [facility] [place] 4 2 2 2   [facility] [address] [facility] 5 2 2 3   [facility] [address], [place] [phone] 5 1 2 2   [facility] [address], [facility] [phone] 5 2 2 3   [phone] [address] [facility], [place] [phone]  3 1 1 1         Barriers to Discharge: Continued Medical Work up   Patient Goals and CMS Choice Patient states their goals for this hospitalization and ongoing recovery are:: Rehab CMS Medicare.gov Compare Post Acute Care list provided to:: Patient Represenative (must comment) Choice offered to / list presented to : Spouse  Expected Discharge Plan and Services   In-house Referral: Clinical Social Work   Post Acute Care Choice: Skilled Nursing Facility Living arrangements for the past 2 months: Single Family Home                                      Prior Living Arrangements/Services Living arrangements for the past 2 months: Single Family Home Lives with:: Spouse Patient language and need for interpreter reviewed:: Yes Do you feel safe going back to the place where you live?: Yes      Need for Family Participation in Patient Care: Yes (Comment) Care giver support system in place?: Yes (comment)   Criminal Activity/Legal Involvement Pertinent to Current Situation/Hospitalization: No - Comment as needed  Activities of Daily Living      Permission Sought/Granted Permission sought to share information with : Facility [phone], Family Supports Permission granted to share information with : Yes, Verbal Permission Granted  Share Information with NAME:  Permission granted to share info w AGENCY: SNFs  Permission granted to  share info w Relationship: Spouse  Permission granted to share info w Contact Information: [phone]  Emotional Assessment Appearance:: Appears stated age Attitude/Demeanor/Rapport: Unable to Assess Affect (typically observed): Unable to Assess Orientation: :  (Follows commands) Alcohol / Substance Use: Not Applicable Psych Involvement: No (comment)  Admission diagnosis:  Stroke [facility]) [I63.9] History of ETT [Z92.89] Middle cerebral artery embolism, left [I66.02] Patient Active Problem List   Diagnosis Date Noted    Protein-calorie malnutrition, severe 11/16/2020   Stroke (HCC) 11/13/2020   Middle cerebral artery embolism, left 11/13/2020   Healthcare maintenance 04/27/2020   Sinus bradycardia 12/25/2019   COPD (chronic obstructive pulmonary disease) (HCC) 12/16/2018   Cough 12/16/2018   Unintentional weight loss 10/05/2018   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   Visit for monitoring Tikosyn therapy 11/18/2017   Closed nondisplaced fracture of head of radius with routine healing 03/03/2017   Coronary artery disease involving native coronary artery of native heart without angina pectoris    Chronic diastolic heart failure (HCC) 03/28/2015   Paroxysmal atrial fibrillation (HCC)    Essential hypertension    S/P femoral-popliteal bypass surgery    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   PAD (peripheral artery disease) (HCC) 03/02/2015   Claudication (HCC) 01/31/2015   Former smoker 01/31/2015   Hyperlipidemia 01/31/2015   Atherosclerosis of native arteries of extremity with intermittent claudication (HCC) 02/10/2014   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

## 2020-11-17 DIAGNOSIS — I6602 Occlusion and stenosis of left middle cerebral artery: Secondary | ICD-10-CM | POA: Diagnosis not present

## 2020-11-17 DIAGNOSIS — E43 Unspecified severe protein-calorie malnutrition: Secondary | ICD-10-CM | POA: Diagnosis not present

## 2020-11-17 DIAGNOSIS — I480 Paroxysmal atrial fibrillation: Secondary | ICD-10-CM | POA: Diagnosis not present

## 2020-11-17 LAB — BASIC METABOLIC PANEL
Anion gap: 7 (ref 5–15)
BUN: 32 mg/dL — ABNORMAL HIGH (ref 8–23)
CO2: 24 mmol/L (ref 22–32)
Calcium: 8.2 mg/dL — ABNORMAL LOW (ref 8.9–10.3)
Chloride: 111 mmol/L (ref 98–111)
Creatinine, Ser: 0.66 mg/dL (ref 0.44–1.00)
GFR, Estimated: >60 mL/min (ref >60)
Glucose, Bld: 115 mg/dL — ABNORMAL HIGH (ref 70–99)
Potassium: 4.1 mmol/L (ref 3.5–5.1)
Sodium: 142 mmol/L (ref 135–145)

## 2020-11-17 LAB — HEPARIN LEVEL (UNFRACTIONATED)
Heparin Unfractionated: 0.21 IU/mL — ABNORMAL LOW (ref 0.30–0.70)
Heparin Unfractionated: 0.35 IU/mL (ref 0.30–0.70)
Heparin Unfractionated: 0.42 [IU]/mL (ref 0.30–0.70)

## 2020-11-17 LAB — PHOSPHORUS: Phosphorus: 2 mg/dL — ABNORMAL LOW (ref 2.5–4.6)

## 2020-11-17 LAB — CBC
HCT: 39.5 % (ref 36.0–46.0)
Hemoglobin: 12.7 g/dL (ref 12.0–15.0)
MCH: 31.3 pg (ref 26.0–34.0)
MCHC: 32.2 g/dL (ref 30.0–36.0)
MCV: 97.3 fL (ref 80.0–100.0)
Platelets: 228 10*3/uL (ref 150–400)
RBC: 4.06 MIL/uL (ref 3.87–5.11)
RDW: 19.2 % — ABNORMAL HIGH (ref 11.5–15.5)
WBC: 14.2 10*3/uL — ABNORMAL HIGH (ref 4.0–10.5)
nRBC: 0 % (ref 0.0–0.2)

## 2020-11-17 LAB — GLUCOSE, CAPILLARY
Glucose-Capillary: 123 mg/dL — ABNORMAL HIGH (ref 70–99)
Glucose-Capillary: 155 mg/dL — ABNORMAL HIGH (ref 70–99)
Glucose-Capillary: 145 mg/dL — ABNORMAL HIGH (ref 70–99)
Glucose-Capillary: 163 mg/dL — ABNORMAL HIGH (ref 70–99)
Glucose-Capillary: 141 mg/dL — ABNORMAL HIGH (ref 70–99)
Glucose-Capillary: 134 mg/dL — ABNORMAL HIGH (ref 70–99)

## 2020-11-17 LAB — PROTIME-INR
INR: 1 (ref 0.8–1.2)
Prothrombin Time: 13.3 seconds (ref 11.4–15.2)

## 2020-11-17 LAB — MAGNESIUM: Magnesium: 2.3 mg/dL (ref 1.7–2.4)

## 2020-11-17 MED ORDER — CHLORHEXIDINE GLUCONATE 0.12 % MT SOLN
15.0000 mL | Freq: Two times a day (BID) | OROMUCOSAL | Status: DC
  Administered 2020-11-17 – 2020-12-06 (×38): 15 mL via OROMUCOSAL
  Filled 2020-11-17 (×25): qty 15

## 2020-11-17 MED ORDER — POLYETHYLENE GLYCOL 3350 17 G PO PACK
17.0000 g | PACK | Freq: Every day | ORAL | Status: DC
  Administered 2020-11-17 – 2020-11-28 (×4): 17 g
  Filled 2020-11-17 (×4): qty 1

## 2020-11-17 MED ORDER — POLYETHYLENE GLYCOL 3350 17 G PO PACK: 17.0000 g | PACK | Freq: Every day | ORAL | Status: DC

## 2020-11-17 MED ORDER — LACTATED RINGERS IV BOLUS
500.0000 mL | Freq: Once | INTRAVENOUS | Status: AC
  Administered 2020-11-17: 500 mL via INTRAVENOUS

## 2020-11-17 MED ORDER — ORAL CARE MOUTH RINSE
15.0000 mL | Freq: Two times a day (BID) | OROMUCOSAL | Status: DC
  Administered 2020-11-17 – 2020-12-05 (×35): 15 mL via OROMUCOSAL

## 2020-11-17 MED ORDER — SODIUM PHOSPHATES 45 MMOLE/15ML IV SOLN
15.0000 mmol | Freq: Once | INTRAVENOUS | Status: AC
  Administered 2020-11-17: 15 mmol via INTRAVENOUS
  Filled 2020-11-17: qty 5

## 2020-11-17 MED ORDER — BETHANECHOL CHLORIDE 10 MG PO TABS
10.0000 mg | ORAL_TABLET | Freq: Three times a day (TID) | ORAL | Status: DC
  Administered 2020-11-17 (×3): 10 mg
  Filled 2020-11-17 (×3): qty 1

## 2020-11-17 MED ORDER — POLYETHYLENE GLYCOL 3350 17 G PO PACK
17.0000 g | PACK | Freq: Every day | ORAL | Status: DC
  Filled 2020-11-17: qty 1

## 2020-11-17 MED ORDER — BETHANECHOL CHLORIDE 10 MG PO TABS: 10.0000 mg | ORAL_TABLET | Freq: Three times a day (TID) | ORAL | Status: DC

## 2020-11-17 NOTE — [place] FL2 (Addendum)
[place] MEDICA[place] FL2 LEVEL OF CARE SCREENING TOOL     IDENTIFICATION  Patient Name: Beverly Li Birthdate: 1945-11-12 Sex: female Admission Date (Current Location): 11/13/2020  [facility] and [place] Number:  Producer, television/film/video and Address:  [facility], [address], [place], [place] [address]      Provider Number: [id number]  Attending Physician Name and Address:  Stroke, Md, MD  Relative Name and Phone Number:       Current Level of Care: Hospital Recommended Level of Care: Skilled Nursing Facility Prior Approval Number:    Date Approved/Denied:   PASRR Number: [id number]  Discharge Plan: SNF    Current Diagnoses: Patient Active Problem List   Diagnosis Date Noted   Protein-calorie malnutrition, severe 11/16/2020   Stroke (HCC) 11/13/2020   Middle cerebral artery embolism, left 11/13/2020   Healthcare maintenance 04/27/2020   Sinus bradycardia 12/25/2019   COPD (chronic obstructive pulmonary disease) (HCC) 12/16/2018   Cough 12/16/2018   Unintentional weight loss 10/05/2018   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   Visit for monitoring Tikosyn therapy 11/18/2017   Closed nondisplaced fracture of head of radius with routine healing 03/03/2017   Coronary artery disease involving native coronary artery of native heart without angina pectoris    Chronic diastolic heart failure (HCC) 03/28/2015   Paroxysmal atrial fibrillation (HCC)    Essential hypertension    S/P femoral-popliteal bypass surgery    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   PAD (peripheral artery disease) (HCC) 03/02/2015   Claudication (HCC) 01/31/2015   Former smoker 01/31/2015   Hyperlipidemia 01/31/2015   Atherosclerosis of native arteries of extremity with intermittent claudication (HCC) 02/10/2014   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   Atrial arrhythmia    Mild aortic sclerosis     Orientation RESPIRATION BLADDER Height  & Weight     Self (follows commands)  O2 (2L Nasal cannula) Incontinent, External catheter Weight: 119 lb 11.4 oz (54.3 kg) Height:  5\' 3"  (160 cm)  BEHAVIORAL SYMPTOMS/MOOD NEUROLOGICAL BOWEL NUTRITION STATUS      Incontinent Diet (please see dc summary)  AMBULATORY STATUS COMMUNICATION OF NEEDS Skin   Extensive Assist Non-Verbally (currently mute) Normal                       Personal Care Assistance Level of Assistance  Bathing, Feeding, Dressing Bathing Assistance: Maximum assistance Feeding assistance: Maximum assistance Dressing Assistance: Maximum assistance     Functional Limitations Info             SPECIAL CARE FACTORS FREQUENCY  PT (By licensed PT), OT (By licensed OT)     PT Frequency: 5x/week OT Frequency: 5x/week            Contractures Contractures Info: Not present    Additional Factors Info  Code Status, Allergies, Psychotropic, Insulin Sliding Scale Code Status Info: Full Allergies Info: Amoxicillin, Atenolol, Crestor (Rosuvastatin Calcium), Pravastatin, Sulfa Antibiotics, Codeine Psychotropic Info: Wellbutrin Insulin Sliding Scale Info: see dc summary       Current Medications (11/17/2020):  This is the current hospital active medication list Current Facility-Administered Medications  Medication Dose Route Frequency Provider Last Rate Last Admin   acetaminophen (TYLENOL) tablet 650 mg  650 mg Oral Q4H PRN 01/17/2021, NP       Or   acetaminophen (TYLENOL) 160 MG/5ML solution 650 mg  650 mg Per Tube Q4H PRN Leda Gauze, NP   650 mg at 11/15/20  1853   Or   acetaminophen (TYLENOL) suppository 650 mg  650 mg Rectal Q4H PRN Leda Gauze, NP   650 mg at 11/15/20 1128   albuterol (PROVENTIL) (2.5 MG/3ML) 0.083% nebulizer solution 3 mL  3 mL Inhalation QID PRN Micki Riley, MD       amiodarone (NEXTERONE PREMIX) 360-4.14 MG/200ML-% (1.8 mg/mL) IV infusion  30 mg/hr Intravenous Continuous Cheri Fowler, MD 16.67 mL/hr  at 11/17/20 1510 30 mg/hr at 11/17/20 1510   arformoterol (BROVANA) nebulizer solution 15 mcg  15 mcg Nebulization BID Lorin Glass, MD   15 mcg at 11/17/20 0857   bethanechol (URECHOLINE) tablet 10 mg  10 mg Per Tube TID Cheri Fowler, MD   10 mg at 11/17/20 1511   budesonide (PULMICORT) nebulizer solution 0.25 mg  0.25 mg Nebulization BID Micki Riley, MD   0.25 mg at 11/17/20 0857   buPROPion [facility]) tablet 75 mg  75 mg Per Tube BID Micki Riley, MD   75 mg at 11/17/20 1100   chlorhexidine (PERIDEX) 0.12 % solution 15 mL  15 mL Mouth Rinse BID Cheri Fowler, MD   15 mL at 11/17/20 3614   Chlorhexidine Gluconate Cloth 2 % PADS 6 each  6 each Topical Daily Erick Blinks, MD   6 each at 11/16/20 1556   diltiazem (CARDIZEM) tablet 30 mg  30 mg Per Tube Daily PRN Georgeann Oppenheim, RPH       ezetimibe (ZETIA) tablet 10 mg  10 mg Per Tube Daily Micki Riley, MD   10 mg at 11/17/20 1100   feeding supplement (OSMOLITE 1.2 CAL) liquid 1,000 mL  1,000 mL Per Tube Continuous Cheri Fowler, MD 55 mL/hr at 11/17/20 1633 Rate Change at 11/17/20 1633   feeding supplement (PROSource TF) liquid 45 mL  45 mL Per Tube BID Cheri Fowler, MD   45 mL at 11/17/20 1100   heparin ADULT infusion 100 units/mL (25000 units/238mL)  1,150 Units/hr Intravenous Continuous Milon Dikes, MD 11.5 mL/hr at 11/17/20 1632 1,150 Units/hr at 11/17/20 1632   hydrALAZINE (APRESOLINE) injection 20 mg  20 mg Intravenous Q6H PRN Merry Lofty, NP       hydrALAZINE (APRESOLINE) tablet 25 mg  25 mg Per Tube Q8H PRN Micki Riley, MD   25 mg at 11/15/20 1853   insulin aspart (novoLOG) injection 0-9 Units  0-9 Units Subcutaneous Q4H Lorin Glass, MD   2 Units at 11/17/20 1603   MEDLINE mouth rinse  15 mL Mouth Rinse q12n4p Cheri Fowler, MD   15 mL at 11/17/20 1505   ondansetron (ZOFRAN) injection 4 mg  4 mg Intravenous Q6H PRN Leda Gauze, NP       pantoprazole sodium (PROTONIX) 40 mg/20 mL oral  suspension 40 mg  40 mg Per Tube QHS Cheri Fowler, MD   40 mg at 11/16/20 2207   polyethylene glycol (MIRALAX / GLYCOLAX) packet 17 g  17 g Per Tube Daily Cheri Fowler, MD   17 g at 11/17/20 1124   revefenacin (YUPELRI) nebulizer solution 175 mcg  175 mcg Nebulization Daily Erick Blinks, MD   175 mcg at 11/17/20 4315     Discharge Medications: Please see discharge summary for a list of discharge medications.  Relevant Imaging Results:  Relevant Lab Results:  I have personally obtained history,examined this patient, reviewed notes, independently viewed imaging studies, participated in medical decision making and plan of care.ROS completed by me personally and pertinent positives fully  documented  I have made any additions or clarifications directly to the above note. Agree with note above.    Delia Heady, MD Medical Director [facility] Pager: [phone] 11/27/2020 7:43 AM   Additional Information SSN: [id number]. Pfizer COVID-19 Vaccine 09/18/2020, 01/27/2020 , 05/27/2019 , 05/06/2019  Mearl Latin, LCSW

## 2020-11-17 NOTE — Progress Notes (Signed)
ANTICOAGULATION CONSULT NOTE  Pharmacy Consult for heparin Indication: atrial fibrillation  Patient Measurements: Height: 5\' 3"  (160 cm) Weight: 54.3 kg (119 lb 11.4 oz) IBW/kg (Calculated) : 52.4 Heparin Dosing Weight: 51 kg   Vital Signs: Temp: 98.6 F (37 C) (08/05 1200) Temp Source: Oral (08/05 1200) BP: 158/72 (08/05 1200) Pulse Rate: 51 (08/05 1200)  Labs: Recent Labs    11/15/20 0303 11/15/20 1633 11/16/20 0003 11/16/20 1056 11/16/20 2044 11/17/20 0505 11/17/20 1209  HGB 13.4  --  12.6  --   --  12.7  --   HCT 42.6  --  39.1  --   --  39.5  --   PLT 190  --  208  --   --  228  --   LABPROT 12.6  --  13.9  --   --  13.3  --   INR 1.0  --  1.1  --   --  1.0  --   HEPARINUNFRC  --    < > <0.10*   < > 0.20* 0.21* 0.35  CREATININE 0.80  --  0.67  --   --  0.66  --    < > = values in this interval not displayed.     Estimated Creatinine Clearance: 50.3 mL/min (by C-G formula based on SCr of 0.66 mg/dL).   Assessment: 3 YOF with h/o Afib on Eliquis PTA who presented with an MCA occlusion and is s/p successful mechanical thrombectomy. Of note, patient's Eliquis was on hold ~ 1 week prior to admission due to recent hematoma. Pharmacy consulted to dose IV heparin for AFib.   Heparin level therapeutic at 0.35 units/mL; no bleeding reported.  Goal of Therapy:  Heparin level 0.3-0.5 units/ml Monitor platelets by anticoagulation protocol: Yes   Plan:  Continue heparin infusion at 1150 units/hr - no bolus with CVA Check confirmatory heparin level Daily heparin level and CBC  Mahalia Dykes D. 61, PharmD, BCPS, BCCCP 11/17/2020, 1:54 PM

## 2020-11-17 NOTE — Progress Notes (Signed)
ANTICOAGULATION CONSULT NOTE  Pharmacy Consult for heparin Indication: atrial fibrillation  Patient Measurements: Height: 5\' 3"  (160 cm) Weight: 54.3 kg (119 lb 11.4 oz) IBW/kg (Calculated) : 52.4 Heparin Dosing Weight: 51 kg   Vital Signs: Temp: 98.5 F (36.9 C) (08/05 1600) Temp Source: Axillary (08/05 1600) BP: 156/78 (08/05 2000) Pulse Rate: 54 (08/05 2000)  Labs: Recent Labs    11/15/20 0303 11/15/20 1633 11/16/20 0003 11/16/20 1056 11/17/20 0505 11/17/20 1209 11/17/20 1936  HGB 13.4  --  12.6  --  12.7  --   --   HCT 42.6  --  39.1  --  39.5  --   --   PLT 190  --  208  --  228  --   --   LABPROT 12.6  --  13.9  --  13.3  --   --   INR 1.0  --  1.1  --  1.0  --   --   HEPARINUNFRC  --    < > <0.10*   < > 0.21* 0.35 0.42  CREATININE 0.80  --  0.67  --  0.66  --   --    < > = values in this interval not displayed.     Estimated Creatinine Clearance: 50.3 mL/min (by C-G formula based on SCr of 0.66 mg/dL).   Assessment: Beverly Li with h/o Afib on Eliquis PTA who presented with an MCA occlusion and is s/p successful mechanical thrombectomy. Of note, patient's Eliquis was on hold ~ 1 week prior to admission due to recent hematoma. Pharmacy consulted to dose IV heparin for AFib.   Heparin level remains therapeutic at 0.42 units/mL. RN reports no s/s of overt bleeding.   Goal of Therapy:  Heparin level 0.3-0.5 units/ml Monitor platelets by anticoagulation protocol: Yes   Plan:  Continue heparin infusion at 1150 units/hr  Daily heparin level and CBC  61, PharmD., BCPS, BCCCP Clinical Pharmacist Please refer to [facility] for unit-specific pharmacist

## 2020-11-17 NOTE — Progress Notes (Signed)
Occupational Therapy Treatment Patient Details Name: Beverly Li MRN: [id number] DOB: Nov 21, 1945 Today's Date: 11/17/2020    History of present illness Pt is a 75 y.o. female who presented 11/13/20 with R sided weakness and L gaze preference. Outside window for tPA. 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 S/p thrombectomy with TICI 3 revascularization 8/1. ETT 8/1-8/2. 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 PMH: AFib on Eliquis (off x 1 week), COPD, remote tobacco abuse, hypertrophic cardiomyopathy, aortic stenosis, PAD, anxiety, arthritis, Bell's palsy, HTN, osteopenia, and GERD   OT comments  Pt asleep on arrival but does demonstrate focused attention with name call. Pt tracking from L side to nasal side of L visual field then back to lateral L visual field. Pt tracking with L eye more than R. Pt following 1 step commands with L UE/ LE this session. Pt tolerates OOB to chair with hoyer lift. Pt with HR 59 throughout session and noted to have accessory muscle breathing with stable oxygen on . Recommendation CIR at this time.  **hoyer lift with RN staff only and recommended 2 hrs max several times throughout a day**   Follow Up Recommendations  CIR    Equipment Recommendations  3 in 1 bedside commode;Wheelchair (measurements OT);Wheelchair cushion (measurements OT);Hospital bed    Recommendations for Other Services Rehab consult    Precautions / Restrictions Precautions Precautions: Fall Precaution Comments: cortrak Restrictions Weight Bearing Restrictions: No       Mobility Bed Mobility Overal bed mobility: Needs Assistance Bed Mobility: Rolling;Supine to Sit Rolling: Total assist;+2 for physical assistance;+2 for safety/equipment   Supine to sit: +2 for physical assistance;Max  assist;HOB elevated     General bed mobility comments: pt rolling R and L for peri care and placement of pad. pt exiting on L side and helped to prop on L UE. pt pushing up with L UE into static sitting with total +2 max (A). Pt static sitting with total to max (A). pt noted to engage core muscles x2 at EOB. pt tracking in L visual field only. pt needs (A) for cervical rotation toward midline but does not sustain. Hoyer pad placed with lateral leans R and L and lifted to chair    Transfers Overall transfer level: Needs assistance Equipment used: 2 person hand held assist             General transfer comment: maxisky to chair at this time. pt noted to have use of accessory muscles for breathing despite RR being 20-22. Pt appears to be taking very shallow breath    Balance     Sitting balance-Leahy Scale: Zero                                     ADL either performed or assessed with clinical judgement   ADL Overall ADL's : Needs assistance/impaired                                       General ADL Comments: total (A) for all peri care. pt noted to have incontinence of bowel and not awareness. stool noted to be very dark  Vision       Perception     Praxis      Cognition Arousal/Alertness: Awake/alert Behavior During Therapy: Flat affect Overall Cognitive Status: Impaired/Different from baseline Area of Impairment: Following commands                   Current Attention Level: Focused   Following Commands: Follows one step commands with increased time;Follows one step commands inconsistently     Problem Solving: Slow processing;Difficulty sequencing;Decreased initiation General Comments: pt following command to raise L UE, hold R UE with L UE hand over hand to help pt initiate, open mouth, visually track therapist, Pt responses with wider look when spouse calls name vs therapists. pushing up with L UE at EOB for transitional  movement        Exercises Exercises: Other exercises Other Exercises Other Exercises: kicking LLE to command and raising L UE to command with repetitive cues to sustain task   Shoulder Instructions       General Comments HR 59 even during activity BP slight increased with reposition to chair but stable. RR 20. RN present to attempt suction at EOB to see if it helped breathing pattern without change. pt noted to have some voice sounds during session but not actively attempting to communicate at all.    Pertinent Vitals/ Pain       Pain Assessment: Faces Faces Pain Scale: Hurts a little bit Pain Location: suction of throat by RN Pain Intervention(s): Monitored during session;Repositioned  Home Living                                          Prior Functioning/Environment              Frequency  Min 2X/week        Progress Toward Goals  OT Goals(current goals can now be found in the care plan section)  Progress towards OT goals: Progressing toward goals  Acute Rehab OT Goals Patient Stated Goal: family like to continue to push for CIr at this time OT Goal Formulation: With patient/family Time For Goal Achievement: 11/29/20 Potential to Achieve Goals: Fair ADL Goals Pt Will Perform Grooming: with min assist;sitting Additional ADL Goal #1: pt will follow 2 step command 50% of session Additional ADL Goal #2: pt will static sit eob for 5 minutes min (A) as precursor to adls  Plan Discharge plan remains appropriate    Co-evaluation    PT/OT/SLP Co-Evaluation/Treatment: Yes Reason for Co-Treatment: Complexity of the patient's impairments (multi-system involvement);Necessary to address cognition/behavior during functional activity;For patient/therapist safety;To address functional/ADL transfers   OT goals addressed during session: ADL's and self-care;Proper use of Adaptive equipment and DME;Strengthening/ROM      AM-PAC OT "6 Clicks" Daily Activity      Outcome Measure   Help from another person eating meals?: A Lot Help from another person taking care of personal grooming?: A Lot Help from another person toileting, which includes using toliet, bedpan, or urinal?: Total Help from another person bathing (including washing, rinsing, drying)?: Total Help from another person to put on and taking off regular upper body clothing?: Total Help from another person to put on and taking off regular lower body clothing?: Total 6 Click Score: 8    End of Session Equipment Utilized During Treatment: Oxygen  OT Visit Diagnosis: Unsteadiness on feet (R26.81);Muscle weakness (generalized) (M62.81);Hemiplegia and hemiparesis  Hemiplegia - Right/Left: Right Hemiplegia - dominant/non-dominant: Dominant Hemiplegia - caused by: Cerebral infarction   Activity Tolerance Patient tolerated treatment well   Patient Left in chair;with call bell/phone within reach;with chair alarm set;with family/visitor present   Nurse Communication Mobility status;Precautions;Need for lift equipment        Time: 1029-1107 OT Time Calculation (min): 38 min  Charges: OT General Charges $OT Visit: 1 Visit OT Treatments $Self Care/Home Management : 23-37 mins   Brynn, OTR/L  Acute Rehabilitation Services Pager: [phone] Office: [phone] .    Mateo Flow 11/17/2020, 11:26 AM

## 2020-11-17 NOTE — Progress Notes (Signed)
eLink Physician-Brief Progress Note Patient Name: Beverly Li DOB: 1945/12/21 MRN: [id number]   Date of Service  11/17/2020  HPI/Events of Note  Oliguria - Has had only 250 mL of urine out this shift. LVEF = 60-65%. Moderate MR.  eICU Interventions  Will bolus with LR 500 mL IV over 60 minutes now.      Intervention Category Major Interventions: Other:  Deshay Kirstein Dennard Nip 11/17/2020, 5:23 AM

## 2020-11-17 NOTE — Progress Notes (Signed)
NAME:  Beverly Li, MRN:  [id number], DOB:  1945/09/23, LOS: 4 ADMISSION DATE:  11/13/2020, CONSULTATION DATE:  11/13/2020 REFERRING MD:  Derry Lory CHIEF COMPLAINT:  AMS   History of Present Illness:  75 year old woman with PMHx significant for Afib (on Eliquis, held x 1 week), AS, COPD, PAD, anxiety, GERD who presented to [facility] 8/1 with right-sided weakness, right facial droop and left gaze preference.    Of note, patient's AC recently held due to hospitalization for a large hematoma 2/2 significant coughing as a result of pneumonia. She was recently admitted to [facility] in [place] and while hospitalized went into Afib with RVR, kept for several days to manage. No medications were changed at discharge. She was advised to follow up with cardiology for AF management and when to restart anticoagulation.  CT Head on arrival to [facility] demonstrated signs of L MCA occlusion; patient was subsequently intubated for AMS and airway protection.  Taken to [facility] for thrombectomy with TICI 3 flow after 1 pass to L MCA M1 seg. Brought to ICU  on vent, Cleviprex and Propofol gtts.   PCCM consulted for help with management.  Pertinent Medical History:  Aortic stenosis Hypertrophic cardiomyopathy Afib on Tikosyn, AC Recent hospitalization for ?hematoma unclear origin PVD COPD - no PFTs on file  Significant Hospital Events: Including procedures, antibiotic start and stop dates in addition to other pertinent events   8/1 admitted, intubated, thrombectomy 8/2 Successful thrombectomy with complete revasculatization, TICI 3 flow. MRI with L ACA/MCA territory infarcts and multiple small punctate infarcts. Extubated late in day to [place]. 8/3 Afib on tele. Amio load + gtt started. Cortrak placement. 8/4 Slow improvement in neuro status, following more commands (LUE/LLE), tracking consistently, working with PT/OT/SLP.  Interim History / Subjective:  Patient converted to sinus rhythm overnight,  remained on amiodarone infusion per cardiology recommendations More awake and tracking examiner  Objective   Blood pressure 137/71, pulse (!) 53, temperature 98.2 F (36.8 C), temperature source Oral, resp. rate 18, height 5\' 3"  (1.6 m), weight 54.3 kg, SpO2 97 %.        Intake/Output Summary (Last 24 hours) at 11/17/2020 0826 Last data filed at 11/17/2020 0800 Gross per 24 hour  Intake 4018.54 ml  Output 850 ml  Net 3168.54 ml   Filed Weights   11/13/20 1300 11/16/20 0421  Weight: 51.4 kg 54.3 kg   Physical Examination: General: Acutely ill-appearing elderly woman, lying in the bed HEENT: [place]/AT, anicteric sclera, PERRL, moist mucous membranes. Neuro: Awake, unable to assess orientation due to aphasia.Tracking with eyes consistently, including past midline. Responds to verbal stimuli. Following commands consistently. Unilateral R-sided neglect noted.  Right facial droop CV: Regular rate and rhythm, no murmur appreciated PULM: Breathing even and unlabored on 2LNC (NRB in place temporarily for breathing treatment). Lung fields CTAB, slightly diminished at bilateral bases. GI: Soft, nontender, nondistended. Normoactive bowel sounds. Extremities: Trace LE edema noted. Skin: Warm/dry,  extensive ecchymosis to anterior lower abdomen/pelvic region.  Resolved Hospital Problem List:  Hypertensive emergency  Assessment & Plan:  Acute left MCA/ACA stroke s/p thrombectomy with TICI 3 Continue neuro watch Secondary stroke prophylaxis Now SBP goal <160 Home meds were resumed  Acute hypoxemic respiratory failure in setting of large left MCA/ACA stroke History of COPD, not in flare Extubated 8/2. Wean supplemental O2 for sat > 90% Continue 01/16/21 Pulmonary hygiene  Hypertension Continue diltiazem, hydralazine  Paroxysmal A. fib with rapid ventricular response Hypertrophic cardiomyopathy Recent hematoma  of unclear etiology requiring AC cessation Patient was in rapid  ventricular response requiring IV amiodarone infusion Currently converted to sinus rhythm with controlled rate and Cardiology was consulted, defer management to them Continue IV heparin for secondary stroke prophylaxis  Acute urinary retention Continue bladder scan and in and out catheterization Started on bethanechol   Best Practice (right click and "Reselect all SmartList Selections" daily)   Diet/type: tubefeeds and NPO DVT prophylaxis: IV heparin infusion GI prophylaxis: PPI Lines: N/A Foley:  N/A Code Status:  full code Last date of multidisciplinary goals of care discussion: 8/5, updated patient's husband and her son at bedside.  Continue full aggressive care    Total critical care time: 37 minutes  Performed by: Cheri Fowler   Critical care time was exclusive of separately billable procedures and treating other patients.   Critical care was necessary to treat or prevent imminent or life-threatening deterioration.   Critical care was time spent personally by me on the following activities: development of treatment plan with patient and/or surrogate as well as nursing, discussions with consultants, evaluation of patient's response to treatment, examination of patient, obtaining history from patient or surrogate, ordering and performing treatments and interventions, ordering and review of laboratory studies, ordering and review of radiographic studies, pulse oximetry and re-evaluation of patient's condition.   Cheri Fowler MD [facility] See Amion for pager If no response to pager, please call [phone] until 7pm After 7pm, Please call E-link [phone]

## 2020-11-17 NOTE — Progress Notes (Signed)
STROKE TEAM PROGRESS NOTE    Interval History   Patient remains aphasic and interacts only little and can follow only a few pantomime commands.  Vital signs are stable.  BMP is normal.  White count is down to 14.2.  Her husband and son are at the bedside.  She remains on amiodarone drip and appreciate cardiology consult.  She remains on heparin drip and level is slightly suboptimal 8.21   Pertinent Lab Work and Imaging    11/13/20 CT Head WO IV Contrast 1. Mild asymmetric hypodensity of the left insula, concerning for acute left MCA territory infarct. An MRI could further evaluate. 2. Hyperdense appearance of the distal left M1 MCA, concerning for thrombus. Recommend CTA to further evaluate. 3. No acute hemorrhage. 4. Remote lacunar infarcts versus dilated perivascular spaces in bilateral inferior basal ganglia. Suspected remote lacunar infarct in the right cerebellum.  11/13/20 CT Angio Head and Neck W WO IV Contrast 1. Occlusion of the left M1 MCA with very little opacification of distal left MCA branches. Perfusion demonstrates the core infarct of 41 mL in the anterior left MCA territory with large (122 ml) of ischemic phenomena in the remaining left MCA and potentially left ACA territories. 2. Age indeterminate occlusion of the proximal right internal carotid artery in the neck with non-opacification through the carotid terminus. The right MCA and right posterior communicating artery are opacified via the of the right A1 ACA. 3. Suspected high-grade stenosis versus occlusion of the distal left A3/A4 ACA. 4. Approximately 40-50% stenosis of the proximal left ICA in the neck. 5. Severe stenosis of the non dominant/small right vertebral artery origin. Moderate stenosis of the intradural left vertebral artery. 6. Severe emphysema.  11/14/20 MRI Brain WO IV Contrast 1. 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 Associated edema without mass effect. 2. Additional scattered  punctate acute infarcts in the right frontoparietal cortex and small acute infarct in the right cerebellum.  11/14/20 Echocardiogram Complete   1. There is no evidence of systolic anterior motion of the mitral valve or true LV outflow obstruction on this study. LVOT velocities are mildly elevated at 2 m/s. Left ventricular ejection fraction, by estimation, is 60 to 65%. The left ventricle has low normal function. The left ventricle has no regional wall motion abnormalities. There is moderate concentric left ventricular hypertrophy. Left ventricular diastolic parameters are consistent with Grade II diastolic dysfunction (pseudonormalization). Elevated left atrial pressure.   2. Right ventricular systolic function is normal. The right ventricular size is normal.   3. Left atrial size was severely dilated.   4. Right atrial size was mildly dilated.   5. The mitral valve is degenerative. Mild to moderate mitral valve regurgitation.   6. The aortic valve is tricuspid. There is moderate calcification of the aortic valve. There is mild thickening of the aortic valve. Aortic valve regurgitation is mild. Mild to moderate aortic valve  sclerosis/calcification is present, without any evidence of aortic stenosis.   7. The inferior vena cava is dilated in size with <50% respiratory variability, suggesting right atrial pressure of 15 mmHg.   Physical Examination   Constitutional: Elderly Caucasian female resting in bed, intubated and sedated  Cardiovascular: Normal RR Respiratory: No increased WOB   Mental status: Unable to answer LOC questions due to severe global aphasia, does not follow commands opens eyes but is mute Speech:  UTA  Cranial nerves: EOMI with skewed gaze( right eye is p slightly hypertropic, Decreased BTT on the right,  UTA face symmetry due to ET tube, UTA tongue due to lack of command following  Motor: Normal bulk and tone. RUE plegic, RLE withdraws subtly to noxious stimulation. LUE  antigravity, she is able to maintain her left leg up when it is flexed in the bed by the examiner  Sensory: Decreased sensation to the right  Coordination: UTA  Gait: Deferred due to right sided weakness   Assessment and Plan   Ms. JOHNNYE SANDFORD is a 75 y.o. female w/pmh of AFib on Eliquis (off x 1 week), COPD, remote tobacco abuse, hypertrophic cardiomyopathy, aortic stenosis, PAD, anxiety, and GERD who presents with right sided weakness, right facial droop, and left gaze preference. She did not qualify for IVTPA but underwent thrombectomy with TICI 3 revascularization   NEURO # LMCA Stroke status post mechanical thrombectomy with TICI 3 revascularization in the setting of atrial fibrillation( taken off anticoagulation due to hematoma)   Patient presented with the symptoms described above. At this time, her stroke work up is complete. CTH w/L MCA stroke. MRI Brain revealed left MCA and ACA stroke. CTA Head and Neck showed occlusion of the left MCA and an age indeterminate occlusion of the proximal right internal carotid artery in the neck with non-opacification through the carotid terminus. Echo w/EF 60 to 65 %, left atrium severely dilated. Stroke labs w/LDL 120 + hemoglobin a1c 6.0. Stroke etiology is atrial fibrillation off of anticoagulation due to hematoma.  - Holding ASA due to hematoma  Heparin IV started 11/15/2020 - Atorvastatin 40 for stroke prevention when NGT is placed or she passes swallow eval  - Will place referral at discharge for stroke follow up   CARDS #Hypertension She has a history of HTN and atrial fibrillation; she takes Diltiazem 30 mg QD, Nebivolol 10 mg QD and Hydralazine 25 mg Q8 at home. Currently blood pressure is normotensive. Are managing blood pressure with Cleviprex at this time.  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 Corliss Skains.  - continue tube feeds and meds per tube - per Stroke MD   3. Abd hematoma - reportedly developed after coughing - Eliquis stopped in hospital in [place] - hematoma has greatly improved - follow on heparin  #Hyperlipidemia From a stroke prevention stand point, the LDL goal is < 70. LDL is 120, will start Atorvastatin 40 mg when enteral access is obtained.   RESP #Hypoxic Respiratory Failure  #History of COPD  Was intubated due to respiratory distress, extubated on 11/14/20. Extubated to 4L [place].  - CCM managing   RENAL Trending lytes and renal function, BUN elevated are trending  Hypophosphatemia - Phosphorus - 2.7->1.7->2.0 Elevated BUN - 24->29->32   GI Currently NPO, will likely need NGT and possibly peg tube  NPO -> Cortrak - supplemental feedings  ENDO #Stroke Diabetes Screening  Hemoglobin A1C this admission noted to be 6, prediabetic range. Managing hyperglycemia with SSI this admission.   HEME #Hematoma  Hemoglobin hematocrit and platelet count stable. She was recently hospitalized for a hematoma that developed due to coughing + Eliquis use.  ID Afebrile. No infectious processes at this time.   Hospital day # 4     Continue IV heparin given she has atrial fibrillation and the stroke occurred when anticoagulation was heldfor abdominal wall bruising.   Continue mobilize out of bed and ongoing therapy consults.  Rehab consult.  Continue core track tube feeding till she is able to swallow.  Continue taper off amiodarone drip.   Appreciate cardiology consult to help manage her rate control medications.  Discussed with Dr. Merrily Pew critical care medicine.  Long discussion with the patient's husband and son at the bedside and answered questions about her prognosis, evaluation and treatment plan and discussion with care team.  Transfer to neurology floor bed 1 she is off amiodarone drip.  This patient is critically ill and at significant risk of neurological worsening, death and care requires constant monitoring of vital signs, hemodynamics,respiratory and cardiac monitoring, extensive review of multiple databases, frequent neurological assessment, discussion with family, other specialists and medical decision making of high complexity.I have made any additions or clarifications directly to the above note.This critical care time does not reflect procedure time, or teaching time or supervisory time of PA/NP/Med Resident etc but could involve care discussion time.  I spent 30 minutes of neurocritical care time  in the care of  this patient. Delia Heady MD  11/17/2020 7:53 AM    To contact Stroke Continuity provider, please refer to [email or web address]. After hours, contact General Neurology

## 2020-11-17 NOTE — Progress Notes (Signed)
ANTICOAGULATION CONSULT NOTE  Pharmacy Consult for heparin Indication: atrial fibrillation Labs:  Assessment: 66 YOF with h/o Afib on Eliquis PTA who presented with an MCA occlusion and is s/p successful mechanical thrombectomy. Of note, patient's Eliquis was on hold ~ 1 week prior to admission due to recent hematoma. Pharmacy consulted to dose IV heparin for AFib.   Heparin level SUBtherapeutic this morning.  No issues noted.  Goal of Therapy:  Heparin level 0.3-0.5 units/ml Monitor platelets by anticoagulation protocol: Yes   Plan:  - Increase Heparin to 1150 units/hr  - Will continue to monitor for any signs/symptoms of bleeding and will follow up with heparin level in 6-8 hours   Thanks for allowing pharmacy to be a part of this patient's care.  Talbert Cage, PharmD Clinical Pharmacist

## 2020-11-17 NOTE — Progress Notes (Addendum)
Progress Note  Patient Name: Beverly Li Date of Encounter: 11/17/2020  Primary Cardiologist: Donato Schultz, MD / EP - Graciela Husbands  Subjective   Son and husband at bedside. Remains aphasic, resting comfortably, able to squeeze left hand very loosely, does not respond with voice. Converted to SB HR 50s overnight.  Inpatient Medications    Scheduled Meds:  arformoterol  15 mcg Nebulization BID   bethanechol  10 mg Per Tube TID   budesonide (PULMICORT) nebulizer solution  0.25 mg Nebulization BID   buPROPion  75 mg Per Tube BID   chlorhexidine  15 mL Mouth Rinse BID   Chlorhexidine Gluconate Cloth  6 each Topical Daily   ezetimibe  10 mg Per Tube Daily   feeding supplement (PROSource TF)  45 mL Per Tube BID   insulin aspart  0-9 Units Subcutaneous Q4H   mouth rinse  15 mL Mouth Rinse q12n4p   nebivolol  10 mg Per Tube Daily   pantoprazole sodium  40 mg Per Tube QHS   polyethylene glycol  17 g Oral Daily   revefenacin  175 mcg Nebulization Daily   Continuous Infusions:  amiodarone 30 mg/hr (11/17/20 0800)   feeding supplement (OSMOLITE 1.2 CAL) 55 mL/hr at 11/17/20 0807   heparin 1,150 Units/hr (11/17/20 0807)   sodium phosphate  Dextrose 5% IVPB 15 mmol (11/17/20 0801)   PRN Meds: acetaminophen **OR** acetaminophen (TYLENOL) oral liquid 160 mg/5 mL **OR** acetaminophen, albuterol, diltiazem, hydrALAZINE, hydrALAZINE, ondansetron (ZOFRAN) IV   Vital Signs    Vitals:   11/17/20 0700 11/17/20 0800 11/17/20 0857 11/17/20 0903  BP: (!) 145/73 137/71 138/67   Pulse: (!) 53 (!) 53 (!) 55   Resp: 17 18 17    Temp:  98.7 F (37.1 C)    TempSrc:  Oral    SpO2: 98% 97% 98% 98%  Weight:      Height:        Intake/Output Summary (Last 24 hours) at 11/17/2020 1000 Last data filed at 11/17/2020 0800 Gross per 24 hour  Intake 4018.54 ml  Output 850 ml  Net 3168.54 ml   Last 3 Weights 11/16/2020 11/13/2020 09/12/2020  Weight (lbs) 119 lb 11.4 oz 113 lb 5.1 oz 109 lb  Weight (kg) 54.3 kg  51.4 kg 49.442 kg     Telemetry    Afib to NSR/SB 50s overnight - Personally Reviewed   Physical Exam   GEN: No acute distress.  HEENT: Normocephalic, atraumatic, sclera non-icteric. Neck: No JVD Cardiac: Reg rhythm, HR 50s, no murmurs, rubs, or gallops.  Respiratory: Clear to auscultation bilaterally. Breathing is unlabored. GI: Soft, nontender, non-distended, BS +x 4. MS: ecchymosis to anterior lower abdomen/pelvic region. Extremities: No clubbing or cyanosis. No edema. Distal pedal pulses are 2+ and equal bilaterally. Neuro: Does not answer with verbal response, mild squeeze of left hand, otherwise does not really participate in encounter Psych:  Calm  Labs    High Sensitivity Troponin:  No results for input(s): TROPONINIHS in the last 720 hours.    Cardiac EnzymesNo results for input(s): TROPONINI in the last 168 hours. No results for input(s): TROPIPOC in the last 168 hours.   Chemistry Recent Labs  Lab 11/13/20 1345 11/13/20 1351 11/15/20 0303 11/16/20 0003 11/17/20 0505  NA 141   < > 142 139 142  K 4.2   < > 4.1 3.8 4.1  CL 105   < > 109 109 111  CO2 27   < > 22 20* 24  GLUCOSE  88   < > 90 135* 115*  BUN 24*   < > 24* 29* 32*  CREATININE 0.77   < > 0.80 0.67 0.66  CALCIUM 8.7*   < > 8.3* 8.0* 8.2*  PROT 5.7*  --   --   --   --   ALBUMIN 3.1*  --   --   --   --   AST 48*  --   --   --   --   ALT 25  --   --   --   --   ALKPHOS 60  --   --   --   --   BILITOT 1.7*  --   --   --   --   GFRNONAA >60   < > >60 >60 >60  ANIONGAP 9   < > < > = values in this interval not displayed.     Hematology Recent Labs  Lab 11/15/20 0303 11/16/20 0003 11/17/20 0505  WBC 18.1* 17.2* 14.2*  RBC 4.32 4.02 4.06  HGB 13.4 12.6 12.7  HCT 42.6 39.1 39.5  MCV 98.6 97.3 97.3  MCH 31.0 31.3 31.3  MCHC 31.5 32.2 32.2  RDW 19.3* 19.2* 19.2*  PLT 190 208 228    BNPNo results for input(s): BNP, PROBNP in the last 168 hours.   DDimer No results for input(s):  DDIMER in the last 168 hours.   Radiology    DG Abd Portable 1V  Result Date: 11/15/2020 CLINICAL DATA:  Feeding tube localization. EXAM: PORTABLE ABDOMEN - 1 VIEW COMPARISON:  11/13/2020 FINDINGS: Soft feeding tube enters the stomach with its tip in body antrum junction region. Gas pattern is unremarkable. IMPRESSION: Soft feeding tube in the stomach with its tip at the body antrum junction region. Electronically Signed   By: Paulina Fusi M.D.   On: 11/15/2020 13:26   IR ANGIO INTRA EXTRACRAN SEL COM CAROTID INNOMINATE UNI L MOD SED  INDICATION: New onset left gaze deviation, right-sided hemiplegia, and global aphasia. 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   EXAM: 1. EMERGENT LARGE VESSEL OCCLUSION THROMBOLYSIS (anterior CIRCULATION)   COMPARISON:  CT angiogram of the head and neck of November 13, 2020.   MEDICATIONS: Ancef 2 g IV antibiotic was administered within 1 hour of the procedure.   ANESTHESIA/SEDATION: General anesthesia.   CONTRAST:  Omnipaque 300 approximately 60 mL.   FLUOROSCOPY TIME:  Fluoroscopy Time: 27 minutes 54 seconds (628 mGy).   COMPLICATIONS: None immediate.   TECHNIQUE: Following a full explanation of the procedure along with the potential associated complications, an informed witnessed consent was obtained. The risks of intracranial hemorrhage of 10%, worsening neurological deficit, ventilator dependency,death and inability to revascularize were all reviewed in detail with the patient's husband.   The patient was then put under general anesthesia by the Department of Anesthesiology at [facility].   The left groin was prepped and draped in the usual sterile fashion. Thereafter using modified Seldinger technique, transfemoral access into the left common femoral artery was obtained without difficulty. Over a 0.035 inch guidewire an 8 [place] Pinnacle 10 cm sheath was inserted. Through this, and also over a 0.035 inch guidewire a a 5.5 [place] 125 cm Berenstein support  catheter inside of an 087 balloon guide catheter which had been prepped with 50% contrast and 50% heparinized saline infusion was advanced to the aortic arch region, and selectively positioned in the left common carotid artery just proximal to  the left common carotid bifurcation. The support catheter, and the guidewire were removed. Good aspiration obtained from the hub of the balloon guide catheter. Arteriograms were then performed centered extra cranially and intracranially.   FINDINGS: The left common carotid arteriogram demonstrates the left external carotid artery and its major branches to be widely patent.   The left internal carotid artery at the bulb has mild to moderate stenosis due to calcified arteriosclerotic plaque. No evidence of intraluminal filling defects or of ulcerations was evident.   More distally the left internal carotid artery is seen to ascend to the cranial skull base. The petrous, cavernous and the supraclinoid segments are widely patent. The left anterior cerebral artery opacifies into the capillary and venous phases.   Prompt opacification via the anterior communicating artery of the right anterior cerebral artery and the right middle cerebral artery is seen into the capillary and venous phases.   The left middle cerebral artery demonstrates complete occlusion in the mid M1 segment.   PROCEDURE: Over a 0.014 inch standard Synchro micro guidewire with a J-tip configuration, a combination of an 071 32 cm Zoom aspiration catheter with the balloon guide catheter were advanced to the supraclinoid left ICA. The balloon guide was situated at the cervical petrous junction.   Using a torque device, access was obtained into the M2 M3 region with the micro guidewire, followed by the microcatheter. The guidewire was removed. Poor aspiration obtained from the hub of the microcatheter. This was then retrieved more proximally. At this time the Zoom aspiration catheter was advanced into the occluded  left middle cerebral artery M1 segment. The micro guidewire and the microcatheter were retrieved more proximally.   Using constant aspiration with Penumbra aspiration device at the hub of the Zoom aspiration catheter, and proximal flow arrest in the left internal carotid artery, the Zoom aspiration catheter was advanced more distally into the dominant inferior division branch. There it was maintained for approximately 2 minutes. Thereafter, the Zoom aspiration catheter was gently retrieved and removed. Aspiration was continued with a 20 mL syringe at the hub of the balloon guide catheter. Following flow reversal, a control arteriogram performed through the balloon guide catheter in the proximal left internal carotid artery demonstrated complete revascularization of the left middle cerebral artery into the capillary and venous phases. The distal MCA branches were widely patent without evidence of intraluminal filling defects.   The left anterior cerebral artery demonstrated wide patency with no change in the contralateral right anterior circulation via the anterior communicating artery.   The balloon guide catheter was then removed. The right common carotid artery was then selected over an 0.035 inch Roadrunner guidewire with a 5 [place] diagnostic catheter. An arteriogram performed demonstrated severe stenosis at the origin of the right external carotid artery though its branches appeared widely patent.   An eccentric smooth plaque was seen projecting in the right common carotid artery proximal to the bifurcation due to an anteriorly positioned plaque.   Right internal carotid artery at the bulb is patent with delayed ascent of contrast to the cranial skull base consistent with angiographic string sign.   CT of the brain demonstrated no evidence of intracranial hemorrhage or mass effect.   The left femoral sheath was removed with hemostasis achieved with manual compression. Distal pulses remained palpable in both  feet unchanged at the end of the procedure.   Patient was left intubated due to patient being unresponsive due to the stroke, and to protect the airway.  Patient was then transferred to PACU and then neuro ICU for post revascularization management.   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   PLAN: Follow-up in the clinic 4 weeks post discharge.     Electronically Signed   By: Julieanne Cotton M.D.   On: 11/14/2020 10:13   Scans on Order [id number]    Cardiac Studies   2D Echo 11/14/20  1. There is no evidence of systolic anterior motion of the mitral valve  or true LV outflow obstruction on this study. LVOT velocities are mildly  elevated at 2 m/s. Left ventricular ejection fraction, by estimation, is  60 to 65%. The left ventricle has  low normal function. The left ventricle has no regional wall motion  abnormalities. There is moderate concentric left ventricular hypertrophy.  Left ventricular diastolic parameters are consistent with Grade II  diastolic dysfunction (pseudonormalization).  Elevated left atrial pressure.   2. Right ventricular systolic function is normal. The right ventricular  size is normal.   3. Left atrial size was severely dilated.   4. Right atrial size was mildly dilated.   5. The mitral valve is degenerative. Mild to moderate mitral valve  regurgitation.   6. The aortic valve is tricuspid. There is moderate calcification of the  aortic valve. There is mild thickening of the aortic valve. Aortic valve  regurgitation is mild. Mild to moderate aortic valve  sclerosis/calcification is present, without any  evidence of aortic stenosis.   7. The inferior vena cava is dilated in size with <50% respiratory   variability, suggesting right atrial pressure of 15 mmHg.   Comparison(s): Prior images reviewed side by side. The left ventricular  diastolic function is significantly worse. Elevated right and left atrial  pressures on the current study.   Patient Profile     75 y.o. female with complex history of persistent atrial fib (OAC recently held due to abdominal hematoma), HTN, HOCM, orthostatic hypotension, CAD with moderate LAD disease 2018, PVD s/p R fem-pop bpg 2016 who was admitted with stroke. Complex hx of afib as outlined - previously on amiodarone then Tikosyn, with recent discontinuation of Tikosyn due to persistent atrial fib as well as recent holding of Eliquis due to abdominal hematoma that occurred after coughing. (Had been tx for PNA shortly before.)  She was intubated and underwent successful thrombectomy with complete revasculatization, TICI 3 flow. MRI with L ACA/MCA territory infarcts and multiple small punctate infarcts. Slow improvement in neuro status. Cardiology consulted for afib during admission with difficulty controlling rates -> started on amiodarone.  Assessment & Plan    1. Persistent atrial fib - Eliquis recently held due to abodminal hematoma at [facility] around weekend of 7/23, plan was to hold Eliquis for several weeks til hematoma resolved and revisit in follow-up this week - NO LONGER on Tikosyn as OP - while on IV amiodarone, appears to have converted to NSR/SB 50s overnight after 23hr - will discuss med plan with MD - she is on amiodarone at 30mg /hr and nebivolol 10mg  daily - given HR 50s this AM will hold beta blocker and review with Dr. - now on IV heparin - if tolerating this, anticipate transition to DOAC at dc if OK with primary teams - check baseline TSH with  AM labs  2. L MCA Stroke - s/p thrombectomy this admission - per notes severe pre occlusive stenosis of the proximal right internal carotid artery associated with a delayed string sign  found during cerebral angiogram, patient may require NIR intervention for the Rt ICA stenosis in the future per Dr. Corliss Skainseveshwar - awaiting neurologic recovery - appreciate care by neurology team  3. HOCM - known diagnosis for patient, follow clinically - 2D echo 8/2 with normal EF, grade 2 DD, severe LAE, mild-moderate MR and mild AI to be followed clinically  4. CAD - no recent angina reported, medical therapy [facility], Zetia at home - LDL 120 on Zetia with hx of statin intolerance so consider referral to lipid clinic to consider [id number] as OP if clinically appropriate  For questions or updates, please contact [facility] Please consult [email or web address] for contact info under Cardiology/STEMI.  Signed, Laurann Montanaayna N Dunn, PA-C 11/17/2020, 10:00 AM    History and all data above reviewed.  Patient examined.  I agree with the findings as above.  Tracks and squeezes my hand.  The patient exam reveals COR:RRR  ,  Lungs: Clear  ,  Abd: Positive bowel sounds, no rebound no guarding, Ext No edema  .  All available labs, radiology testing, previous records reviewed. Agree with documented assessment and plan.  Atrial fib:  Now in NSR.  Continue IV amiodarone today and likely change to PO/VT at least for the short term in the AM.  Resume Eliquis when OK with neurology.  I spoke with her husband and she sounds like she had a spontaneous abdominal hematoma related to coughing.    Fayrene FearingJames Taesha Goodell  11:06 AM  11/17/2020

## 2020-11-17 NOTE — Progress Notes (Addendum)
Physical Therapy Treatment Patient Details Name: Beverly Li MRN: [id number] DOB: 10-01-1945 Today's Date: 11/17/2020    History of Present Illness Pt is a 75 y.o. female who presented 11/13/20 with R sided weakness and L gaze preference. Outside window for tPA. 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 S/p thrombectomy with TICI 3 revascularization 8/1. ETT 8/1-8/2. 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 PMH: AFib on Eliquis (off x 1 week), COPD, remote tobacco abuse, hypertrophic cardiomyopathy, aortic stenosis, PAD, anxiety, arthritis, Bell's palsy, HTN, osteopenia, and GERD    PT Comments    Focused session on facilitating muscle activation and improved balance. Pt with x2 brief reactional core muscle activation to prevent posterior LOB while sitting EOB this date. However, pt continues to require mod-TA posteriorly to prevent posterior and R lateral LOB sitting statically EOB. Pt also able to follow command to initiate propping and pushing up on L UE to transition sidelying to sit EOB, maxAx2. She continues to display no activation of her L limbs, but can follow commands to grab her R UE with her L to increase awareness. Will continue to follow acutely. Current recommendations remain appropriate.  **Placed order for bil prevalon boots    Follow Up Recommendations  CIR;Supervision/Assistance - 24 hour     Equipment Recommendations  3in1 (PT);Hospital bed    Recommendations for Other Services Rehab consult     Precautions / Restrictions Precautions Precautions: Fall Precaution Comments: cortrak Restrictions Weight Bearing Restrictions: No    Mobility  Bed Mobility Overal bed mobility: Needs Assistance Bed Mobility: Rolling;Supine to Sit Rolling: Total assist;+2 for physical  assistance;+2 for safety/equipment   Supine to sit: +2 for physical assistance;Max assist;HOB elevated     General bed mobility comments: pt rolling R and L for peri care and placement of pad. pt exiting on L side and helped to prop on L UE. pt pushing up with L UE into static sitting with +2 max (A). Pt static sitting with mod to TA. pt noted to engage core muscles x2 at EOB in reaction to minor LOB. pt tracking in L visual field only. pt needs (A) for cervical rotation toward midline but does not sustain. Hoyer pad placed with lateral leans R and L while sitting EOB and lifted to chair    Transfers Overall transfer level: Needs assistance Equipment used: 2 person hand held assist             General transfer comment: maxisky to chair at this time. pt noted to have use of accessory muscles for breathing despite RR being 20-22. Pt appears to be taking very shallow breath  Ambulation/Gait             General Gait Details: Unable this date.   Stairs             Wheelchair Mobility    Modified Rankin (Stroke Patients Only) Modified Rankin (Stroke Patients Only) Pre-Morbid Rankin Score: No symptoms Modified Rankin: Severe disability     Balance Overall balance assessment: Needs assistance Sitting-balance support: Single extremity supported;No upper extremity supported;Feet supported Sitting balance-Leahy Scale: Zero Sitting balance - Comments: Mod-TA posteriorly sitting EOB, x2 brief active reactional strategies to try to prevent LOB. Postural control: Posterior lean;Right lateral lean     Standing balance comment: Unable this date.  Cognition Arousal/Alertness: Awake/alert Behavior During Therapy: Flat affect Overall Cognitive Status: Impaired/Different from baseline Area of Impairment: Following commands                   Current Attention Level: Focused   Following Commands: Follows one step commands with  increased time;Follows one step commands inconsistently     Problem Solving: Slow processing;Difficulty sequencing;Decreased initiation General Comments: pt following command to raise L UE, hold R UE with L UE hand over hand to help pt initiate, open mouth, visually track therapist, Pt responds with wider look when spouse calls name vs therapists. pushing up with L UE at EOB for transitional movement      Exercises Other Exercises Other Exercises: kicking LLE to command and raising L UE to command with repetitive cues to sustain task Other Exercises: PROM into dorsiflexion bil ankles 3x    General Comments General comments (skin integrity, edema, etc.): HR 59 even during activity BP slight increased with reposition to chair but stable. RR 20. RN present to attempt suction at EOB to see if it helped breathing pattern without change. pt noted to have some voice sounds during session but not actively attempting to communicate at all.      Pertinent Vitals/Pain Pain Assessment: Faces Faces Pain Scale: Hurts a little bit Pain Location: suction of throat by RN Pain Descriptors / Indicators: Grimacing;Guarding Pain Intervention(s): Limited activity within patient's tolerance;Monitored during session;Repositioned    Home Living                      Prior Function            PT Goals (current goals can now be found in the care plan section) Acute Rehab PT Goals Patient Stated Goal: family like to continue to push for CIR at this time PT Goal Formulation: With family Time For Goal Achievement: 11/29/20 Potential to Achieve Goals: Fair Progress towards PT goals: Progressing toward goals    Frequency    Min 4X/week      PT Plan Current plan remains appropriate    Co-evaluation PT/OT/SLP Co-Evaluation/Treatment: Yes Reason for Co-Treatment: Complexity of the patient's impairments (multi-system involvement);Necessary to address cognition/behavior during functional  activity;For patient/therapist safety;To address functional/ADL transfers PT goals addressed during session: Mobility/safety with mobility;Balance OT goals addressed during session: ADL's and self-care;Proper use of Adaptive equipment and DME;Strengthening/ROM      AM-PAC PT "6 Clicks" Mobility   Outcome Measure  Help needed turning from your back to your side while in a flat bed without using bedrails?: Total Help needed moving from lying on your back to sitting on the side of a flat bed without using bedrails?: Total Help needed moving to and from a bed to a chair (including a wheelchair)?: Total Help needed standing up from a chair using your arms (e.g., wheelchair or bedside chair)?: Total Help needed to walk in hospital room?: Total Help needed climbing 3-5 steps with a railing? : Total 6 Click Score: 6    End of Session Equipment Utilized During Treatment: Gait belt;Oxygen;Other (comment) (lift) Activity Tolerance: Patient tolerated treatment well Patient left: in chair;with call bell/phone within reach;with chair alarm set;with family/visitor present Nurse Communication: Mobility status;Need for lift equipment PT Visit Diagnosis: Muscle weakness (generalized) (M62.81);Difficulty in walking, not elsewhere classified (R26.2);Other symptoms and signs involving the nervous system (R29.898);Hemiplegia and hemiparesis Hemiplegia - Right/Left: Right Hemiplegia - dominant/non-dominant: Dominant Hemiplegia - caused by: Cerebral infarction     Time: 4132-4401  PT Time Calculation (min) (ACUTE ONLY): 30 min  Charges:  $Neuromuscular Re-education: 8-22 mins                     Raymond Gurney, PT, DPT Acute Rehabilitation Services  Pager: [phone] Office: [phone]    Beverly Li 11/17/2020, 12:32 PM

## 2020-11-17 NOTE — Progress Notes (Signed)
Phos 2.0 ?Replaced per protocol  ?

## 2020-11-18 ENCOUNTER — Inpatient Hospital Stay (HOSPITAL_COMMUNITY): Payer: Medicare Other

## 2020-11-18 DIAGNOSIS — I4891 Unspecified atrial fibrillation: Secondary | ICD-10-CM

## 2020-11-18 DIAGNOSIS — E43 Unspecified severe protein-calorie malnutrition: Secondary | ICD-10-CM | POA: Diagnosis not present

## 2020-11-18 DIAGNOSIS — R1312 Dysphagia, oropharyngeal phase: Secondary | ICD-10-CM

## 2020-11-18 DIAGNOSIS — E7800 Pure hypercholesterolemia, unspecified: Secondary | ICD-10-CM

## 2020-11-18 DIAGNOSIS — I480 Paroxysmal atrial fibrillation: Secondary | ICD-10-CM | POA: Diagnosis not present

## 2020-11-18 DIAGNOSIS — I6602 Occlusion and stenosis of left middle cerebral artery: Secondary | ICD-10-CM | POA: Diagnosis not present

## 2020-11-18 DIAGNOSIS — R0603 Acute respiratory distress: Secondary | ICD-10-CM | POA: Diagnosis not present

## 2020-11-18 LAB — PROTIME-INR
INR: 1 (ref 0.8–1.2)
Prothrombin Time: 13.1 seconds (ref 11.4–15.2)

## 2020-11-18 LAB — GLUCOSE, CAPILLARY
Glucose-Capillary: 106 mg/dL — ABNORMAL HIGH (ref 70–99)
Glucose-Capillary: 131 mg/dL — ABNORMAL HIGH (ref 70–99)
Glucose-Capillary: 144 mg/dL — ABNORMAL HIGH (ref 70–99)
Glucose-Capillary: 155 mg/dL — ABNORMAL HIGH (ref 70–99)
Glucose-Capillary: 162 mg/dL — ABNORMAL HIGH (ref 70–99)
Glucose-Capillary: 98 mg/dL (ref 70–99)

## 2020-11-18 LAB — CBC
HCT: 38.2 % (ref 36.0–46.0)
Hemoglobin: 12.1 g/dL (ref 12.0–15.0)
MCH: 31.3 pg (ref 26.0–34.0)
MCHC: 31.7 g/dL (ref 30.0–36.0)
MCV: 99 fL (ref 80.0–100.0)
Platelets: 242 10*3/uL (ref 150–400)
RBC: 3.86 MIL/uL — ABNORMAL LOW (ref 3.87–5.11)
RDW: 19.1 % — ABNORMAL HIGH (ref 11.5–15.5)
WBC: 12 10*3/uL — ABNORMAL HIGH (ref 4.0–10.5)
nRBC: 0 % (ref 0.0–0.2)

## 2020-11-18 LAB — TSH: TSH: 1.087 u[IU]/mL (ref 0.350–4.500)

## 2020-11-18 LAB — BASIC METABOLIC PANEL
Anion gap: 6 (ref 5–15)
BUN: 32 mg/dL — ABNORMAL HIGH (ref 8–23)
CO2: 25 mmol/L (ref 22–32)
Calcium: 8.5 mg/dL — ABNORMAL LOW (ref 8.9–10.3)
Chloride: 111 mmol/L (ref 98–111)
Creatinine, Ser: 0.62 mg/dL (ref 0.44–1.00)
GFR, Estimated: >60 mL/min (ref >60)
Glucose, Bld: 154 mg/dL — ABNORMAL HIGH (ref 70–99)
Potassium: 4.3 mmol/L (ref 3.5–5.1)
Sodium: 142 mmol/L (ref 135–145)

## 2020-11-18 LAB — PHOSPHORUS: Phosphorus: 2.1 mg/dL — ABNORMAL LOW (ref 2.5–4.6)

## 2020-11-18 LAB — HEPARIN LEVEL (UNFRACTIONATED): Heparin Unfractionated: 0.37 IU/mL (ref 0.30–0.70)

## 2020-11-18 MED ORDER — SODIUM PHOSPHATES 45 MMOLE/15ML IV SOLN
30.0000 mmol | Freq: Once | INTRAVENOUS | Status: AC
  Administered 2020-11-18: 30 mmol via INTRAVENOUS
  Filled 2020-11-18: qty 10

## 2020-11-18 MED ORDER — METOPROLOL TARTRATE 25 MG PO TABS
25.0000 mg | ORAL_TABLET | Freq: Two times a day (BID) | ORAL | Status: DC
  Administered 2020-11-18 – 2020-11-19 (×2): 25 mg
  Filled 2020-11-18 (×2): qty 1

## 2020-11-18 MED ORDER — APIXABAN 5 MG PO TABS
5.0000 mg | ORAL_TABLET | Freq: Two times a day (BID) | ORAL | Status: DC
  Filled 2020-11-18: qty 1

## 2020-11-18 MED ORDER — BETHANECHOL CHLORIDE 10 MG PO TABS
25.0000 mg | ORAL_TABLET | Freq: Three times a day (TID) | ORAL | Status: DC
  Administered 2020-11-18 – 2020-11-21 (×12): 25 mg
  Filled 2020-11-18 (×12): qty 3

## 2020-11-18 MED ORDER — APIXABAN 5 MG PO TABS
5.0000 mg | ORAL_TABLET | Freq: Two times a day (BID) | ORAL | Status: DC
  Administered 2020-11-18 – 2020-11-28 (×20): 5 mg
  Filled 2020-11-18 (×20): qty 1

## 2020-11-18 MED ORDER — AMIODARONE HCL 200 MG PO TABS
400.0000 mg | ORAL_TABLET | Freq: Two times a day (BID) | ORAL | Status: DC
  Administered 2020-11-18 – 2020-11-19 (×3): 400 mg
  Filled 2020-11-18 (×3): qty 2

## 2020-11-18 MED ORDER — FUROSEMIDE 10 MG/ML IJ SOLN: 40.0000 mg | Freq: Once | INTRAMUSCULAR | Status: DC

## 2020-11-18 NOTE — Progress Notes (Signed)
STROKE TEAM PROGRESS NOTE    Interval History   Patient husband and son are at the bedside. Pt eyes open, but no eye contact, remains global aphasic. Still on on amiodarone drip and pending cardiology for transition to po.  She remains on heparin drip and tolerating well. Will switch to eliquis soon. Pt continues to have mild respiratory distress, pending cardiology to see today.    Pertinent Lab Work and Imaging    11/13/20 CT Head WO IV Contrast 1. Mild asymmetric hypodensity of the left insula, concerning for acute left MCA territory infarct. An MRI could further evaluate. 2. Hyperdense appearance of the distal left M1 MCA, concerning for thrombus. Recommend CTA to further evaluate. 3. No acute hemorrhage. 4. Remote lacunar infarcts versus dilated perivascular spaces in bilateral inferior basal ganglia. Suspected remote lacunar infarct in the right cerebellum.  11/13/20 CT Angio Head and Neck W WO IV Contrast 1. Occlusion of the left M1 MCA with very little opacification of distal left MCA branches. Perfusion demonstrates the core infarct of 41 mL in the anterior left MCA territory with large (122 ml) of ischemic phenomena in the remaining left MCA and potentially left ACA territories. 2. Age indeterminate occlusion of the proximal right internal carotid artery in the neck with non-opacification through the carotid terminus. The right MCA and right posterior communicating artery are opacified via the of the right A1 ACA. 3. Suspected high-grade stenosis versus occlusion of the distal left A3/A4 ACA. 4. Approximately 40-50% stenosis of the proximal left ICA in the neck. 5. Severe stenosis of the non dominant/small right vertebral artery origin. Moderate stenosis of the intradural left vertebral artery. 6. Severe emphysema.  11/14/20 MRI Brain WO IV Contrast 1. 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 Associated edema without mass effect. 2. Additional scattered  punctate acute infarcts in the right frontoparietal cortex and small acute infarct in the right cerebellum.  11/14/20 Echocardiogram Complete   1. There is no evidence of systolic anterior motion of the mitral valve or true LV outflow obstruction on this study. LVOT velocities are mildly elevated at 2 m/s. Left ventricular ejection fraction, by estimation, is 60 to 65%. The left ventricle has low normal function. The left ventricle has no regional wall motion abnormalities. There is moderate concentric left ventricular hypertrophy. Left ventricular diastolic parameters are consistent with Grade II diastolic dysfunction (pseudonormalization). Elevated left atrial pressure.   2. Right ventricular systolic function is normal. The right ventricular size is normal.   3. Left atrial size was severely dilated.   4. Right atrial size was mildly dilated.   5. The mitral valve is degenerative. Mild to moderate mitral valve regurgitation.   6. The aortic valve is tricuspid. There is moderate calcification of the aortic valve. There is mild thickening of the aortic valve. Aortic valve regurgitation is mild. Mild to moderate aortic valve  sclerosis/calcification is present, without any evidence of aortic stenosis.   7. The inferior vena cava is dilated in size with <50% respiratory variability, suggesting right atrial pressure of 15 mmHg.   Physical Examination    Temp:  [97.9 F (36.6 C)-99.1 F (37.3 C)] 97.9 F (36.6 C) (08/06 0800) Pulse Rate:  [51-75] 72 (08/06 1000) Resp:  [17-39] 23 (08/06 1000) BP: (133-173)/(62-82) 144/69 (08/06 1000) SpO2:  [91 %-99 %] 92 % (08/06 1000) FiO2 (%):  [28 %] 28 % (08/06 0910)  General - Well nourished, well developed, in mild respiratory distress.  Ophthalmologic - fundi  not visualized due to noncooperation.  Cardiovascular - Regular rhythm and rate, not in afib.  Neuro - awake, eyes spontanousely open, but no eye contact, still has globa aphasia, nonverbal,  not following commands, not able to name and repeat and read. Left gaze preference and not able to cross midline. Mildly blinking to visual threat on the left but not on the right. Not tracking, PERRL. Right mild facial droop. Tongue protrusion not cooperative. LUE able to hold with no drift. LLE can hold at knee flexion and foot on bed position. RUE and RLE flaccid, right babinski positive. Sensation, coordination and gait not tested.    Assessment and Plan   [name] is a 75 y.o. female w/pmh of AFib on Eliquis (off x 1 week for abdominal hematoma), COPD, remote tobacco abuse, hypertrophic cardiomyopathy, aortic stenosis, PAD who presents with right sided weakness, right facial droop, and left gaze preference. 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  MRI Brain revealed left MCA and ACA stroke.  CTA Head and Neck showed occlusion of the left MCA and an age indeterminate occlusion of the proximal right internal carotid artery in the neck with non-opacification through the carotid terminus.  Echo w/EF 60 to 65 %, left atrium severely dilated.  LDL 120  hemoglobin a1c 6.0.  On Heparin IV since 11/15/2020 -. Will transition to eliquis in am  #PAF Was on elqiuis  On hold PTA due to abdominal hematoma Now on heparin IV Will transition to eliquis soon Afib RVR - cardiology on board Now on IV amiodarone - plan for transition to po by cardiology today  #Hypertension Diltiazem 30 mg QD, Nebivolol 10 mg QD and Hydralazine 25 mg Q8 at home.  Currently blood pressure goal is normotensive.  BP stable   R ICA stenosis per Dr. Corliss Skains, IR showed severe pre occlusive stenosis of the proximal right internal carotid artery associated with a delayed string sign found during cerebral angiogram, patient may require NIR intervention for the  Rt ICA stenosis in the future  Follow up with Dr. Corliss Skains as outpt   Abd hematoma - reportedly developed after coughing - Eliquis stopped in hospital in [place] - hematoma has greatly improved - tolerating IV heparin - will switch back to eliquis soon  #Hyperlipidemia LDL goal is < 70.  LDL is 120 Allergic to statin meds Continue home zetia  Respiratory distress  - Was intubated due to respiratory distress - extubated to 4L on 11/14/20.  - CCM managing  - still has mild respiratory distress, especially expiratory  - CXR pending - pending cardiology recs, may consider lasix if needed  Dysphagia  Currently NPO on NGT and TF @ 55 Speech following  Other stroke risk factors Advanced age Former smoker PAD  Other acute issue Leukocytosis - WBC 17.2->14.2->12.0   Hospital day # 5  This patient is critically ill due to stroke, PAF with RVR, respiratory failure, dysphagia and at significant risk of neurological worsening, death form recurent stroke, heart failure, aspiration, seizure. This patient's care requires constant monitoring of vital signs, hemodynamics, respiratory and cardiac monitoring, review of multiple databases, neurological assessment, discussion with family, other specialists and medical decision making of high complexity. I spent 50 minutes of neurocritical care time in the care of this patient. I had long discussion  with husband and son at bedside, updated pt current condition, treatment plan and potential prognosis, and answered all the questions. They expressed understanding and appreciation. I also discussed with Dr. Reesa Chew, MD PhD Stroke Neurology 11/18/2020 12:21 PM   To contact Stroke Continuity provider, please refer to [email or web address]. After hours, contact General Neurology

## 2020-11-18 NOTE — Progress Notes (Addendum)
eLink Physician-Brief Progress Note Patient Name: Beverly Li DOB: 1946/01/15 MRN: [id number]   Date of Service  11/18/2020  HPI/Events of Note  Notified of urinary retention with bladder scan revealing 300cc of urine.  eICU Interventions  Straight cath PRN.        Larinda Buttery 11/18/2020, 3:21 AM  3:23 AM K 4.3, phos 2.1, crea 0.62.  Plan> Na[facility] ordered.

## 2020-11-18 NOTE — Progress Notes (Signed)
RT NOTE: RT placed patient on CPAP per order with 5cm of pressure and 2L O2 bleed in.. Patient tolerating well at this time. Vitals are stable and sats are 94%. RT will continue to monitor.

## 2020-11-18 NOTE — Progress Notes (Signed)
ANTICOAGULATION CONSULT NOTE  Pharmacy Consult for heparin Indication: atrial fibrillation  Patient Measurements: Height: 5\' 3"  (160 cm) Weight: 54.3 kg (119 lb 11.4 oz) IBW/kg (Calculated) : 52.4 Heparin Dosing Weight: 51 kg   Vital Signs: Temp: 99 F (37.2 C) (08/06 1200) Temp Source: Axillary (08/06 1200) BP: 144/69 (08/06 1000) Pulse Rate: 72 (08/06 1000)  Labs: Recent Labs    11/16/20 0003 11/16/20 1056 11/17/20 0505 11/17/20 1209 11/17/20 1936 11/18/20 0100  HGB 12.6  --  12.7  --   --  12.1  HCT 39.1  --  39.5  --   --  38.2  PLT 208  --  228  --   --  242  LABPROT 13.9  --  13.3  --   --  13.1  INR 1.1  --  1.0  --   --  1.0  HEPARINUNFRC <0.10*   < > 0.21* 0.35 0.42 0.37  CREATININE 0.67  --  0.66  --   --  0.62   < > = values in this interval not displayed.     Estimated Creatinine Clearance: 50.3 mL/min (by C-G formula based on SCr of 0.62 mg/dL).   Assessment: 31 YOF with h/o Afib on Eliquis PTA who presented with an MCA occlusion and is s/p successful mechanical thrombectomy. Of note, patient's Eliquis was on hold ~ 1 week prior to admission due to recent hematoma. Pharmacy consulted to dose IV heparin for AFib.   Heparin level remains therapeutic at 0.37 units/mL on 1150 units/hr. No s/s of overt bleeding, CBC stable.   Goal of Therapy:  Heparin level 0.3-0.5 units/ml Monitor platelets by anticoagulation protocol: Yes   Plan:  Continue heparin infusion at 1150 units/hr  Daily heparin level and CBC Monitor for s/sx of bleeding May switch to DOAC soon  Thank you for involving pharmacy in this patient's care.  June, PharmD, BCPS Clinical Pharmacist Clinical phone for 11/18/2020 until 3p is [phone] 11/18/2020 12:28 PM  **Pharmacist phone directory can be found on [email or web address] listed under [facility]**

## 2020-11-18 NOTE — Progress Notes (Signed)
NAME:  Beverly Li, MRN:  [id number], DOB:  [birth year], LOS: 5 ADMISSION DATE:  11/13/2020, CONSULTATION DATE:  11/13/2020 REFERRING MD:  Derry Lory CHIEF COMPLAINT:  AMS   History of Present Illness:  75 year old woman with PMHx significant for Afib (on Eliquis, held x 1 week), AS, COPD, PAD, anxiety, GERD who presented to [facility] 8/1 with right-sided weakness, right facial droop and left gaze preference.    Of note, patient's AC recently held due to hospitalization for a large hematoma 2/2 significant coughing as a result of pneumonia. She was recently admitted to [facility] in [place], [place] and while hospitalized went into Afib with RVR, kept for several days to manage. No medications were changed at discharge. She was advised to follow up with cardiology for AF management and when to restart anticoagulation.  CT Head on arrival to [facility] demonstrated signs of L MCA occlusion; patient was subsequently intubated for AMS and airway protection.  Taken to [facility] for thrombectomy with TICI 3 flow after 1 pass to L MCA M1 seg. Brought to ICU  on vent, Cleviprex and Propofol gtts.   PCCM consulted for help with management.  Significant Hospital Events: Including procedures, antibiotic start and stop dates in addition to other pertinent events   8/1 admitted, intubated, thrombectomy 8/2 Successful thrombectomy with complete revasculatization, TICI 3 flow. MRI with L ACA/MCA territory infarcts and multiple small punctate infarcts. Extubated late in day to [place]. 8/3 Afib on tele. Amio load + gtt started. Cortrak placement. 8/4 Slow improvement in neuro status, following more commands (LUE/LLE), tracking consistently, working with PT/OT/SLP. 8/5 patient converted to sinus rhythm, cardiology is following on amiodarone infusion  Interim History / Subjective:  Patient remained in sinus bradycardia, cardiology recommended stopping beta-blocker and continuing IV amiodarone infusion Remained  afebrile Started with acute urinary retention, requiring straight cath twice yesterday Objective   Blood pressure (!) 144/69, pulse 72, temperature 97.9 F (36.6 C), temperature source Oral, resp. rate (!) 23, height 5\' 3"  (1.6 m), weight 54.3 kg, SpO2 92 %.    FiO2 (%):  [28 %] 28 %   Intake/Output Summary (Last 24 hours) at 11/18/2020 1156 Last data filed at 11/18/2020 1000 Gross per 24 hour  Intake 1389.42 ml  Output 600 ml  Net 789.42 ml   Filed Weights   11/13/20 1300 11/16/20 0421  Weight: 51.4 kg 54.3 kg   Physical Examination: General: Acutely ill-appearing elderly woman, lying in the bed HEENT: [place]/AT, anicteric sclera, PERRL, moist mucous membranes. Neuro: Awake, aphasic.Tracking examiner  Unilateral R-sided neglect noted.  Right facial droop CV: Sinus bradycardia, no murmur appreciated PULM: Breathing even and unlabored on 2LNC (NRB in place temporarily for breathing treatment). Lung fields CTAB, slightly diminished at bilateral bases. GI: Soft, nontender, nondistended. Normoactive bowel sounds. Extremities: Trace LE edema noted. Skin: Warm/dry,  extensive ecchymosis to anterior lower abdomen/pelvic region.  Resolved Hospital Problem List:  Hypertensive emergency  Assessment & Plan:  Acute left MCA/ACA stroke s/p thrombectomy with TICI 3 Continue neuro watch Secondary stroke prophylaxis Now SBP goal <160 Continue anticoagulation with IV heparin, may switch to DOAC today  Acute hypoxemic respiratory failure in setting of large left MCA/ACA stroke History of COPD, not in flare Extubated 8/2. Wean supplemental O2 for sat > 90% Continue 01/16/21 Pulmonary hygiene  Hypertension, better controlled Continue diltiazem, hydralazine  Paroxysmal A. fib with rapid ventricular response Hypertrophic cardiomyopathy Recent hematoma of unclear etiology requiring AC cessation Patient's heart rate is well controlled,  remained in sinus rhythm Beta-blocker stopped by  cardiology due to bradycardia Awaiting cardiology recommendation to switch IV amiodarone to p.o. Continue anticoagulation for secondary stroke prophylaxis  Acute urinary retention Continue bladder scan and in and out catheterization Increase bethanechol to 25 mg 3 times daily   Best Practice (right click and "Reselect all SmartList Selections" daily)   Diet/type: tubefeeds and NPO DVT prophylaxis: IV heparin infusion GI prophylaxis: PPI Lines: N/A Foley:  N/A Code Status:  full code Last date of multidisciplinary goals of care discussion: 8/6, updated patient's husband and her son at bedside.  Continue full aggressive care    Total critical care time: 31 minutes  Performed by: Cheri Fowler   Critical care time was exclusive of separately billable procedures and treating other patients.   Critical care was necessary to treat or prevent imminent or life-threatening deterioration.   Critical care was time spent personally by me on the following activities: development of treatment plan with patient and/or surrogate as well as nursing, discussions with consultants, evaluation of patient's response to treatment, examination of patient, obtaining history from patient or surrogate, ordering and performing treatments and interventions, ordering and review of laboratory studies, ordering and review of radiographic studies, pulse oximetry and re-evaluation of patient's condition.   Cheri Fowler MD [facility] See Amion for pager If no response to pager, please call [phone] until 7pm After 7pm, Please call E-link [phone]

## 2020-11-18 NOTE — Progress Notes (Signed)
Inpatient Rehab Admissions Coordinator Note:   Per therapy recommendations, pt was rescreened for CIR candidacy by Megan Salon, MS CCC-SLP. Pt. Continues to demonstrate limited tolerance and participation and I do not believe she is able to tolerate intensity of CIR at this time. [facility] team will continue to follow and monitor for progress and participation, but if Pt. Does not improve, she may require short term rehab SNF. Please contact me with questions.   Megan Salon, MS, CCC-SLP Rehab Admissions Coordinator  [phone] (celll) [phone] (office)

## 2020-11-18 NOTE — Progress Notes (Signed)
ANTICOAGULATION CONSULT NOTE  Pharmacy Consult for heparin > heparin  Indication: atrial fibrillation  Patient Measurements: Height: 5\' 3"  (160 cm) Weight: 54.3 kg (119 lb 11.4 oz) IBW/kg (Calculated) : 52.4 Heparin Dosing Weight: 51 kg   Vital Signs: Temp: 99 F (37.2 C) (08/06 1200) Temp Source: Axillary (08/06 1200) BP: 134/62 (08/06 1400) Pulse Rate: 62 (08/06 1400)  Labs: Recent Labs    11/16/20 0003 11/16/20 1056 11/17/20 0505 11/17/20 1209 11/17/20 1936 11/18/20 0100  HGB 12.6  --  12.7  --   --  12.1  HCT 39.1  --  39.5  --   --  38.2  PLT 208  --  228  --   --  242  LABPROT 13.9  --  13.3  --   --  13.1  INR 1.1  --  1.0  --   --  1.0  HEPARINUNFRC <0.10*   < > 0.21* 0.35 0.42 0.37  CREATININE 0.67  --  0.66  --   --  0.62   < > = values in this interval not displayed.     Estimated Creatinine Clearance: 50.3 mL/min (by C-G formula based on SCr of 0.62 mg/dL).   Assessment: 56 YOF with h/o Afib on Eliquis PTA who presented with an MCA occlusion and is s/p successful mechanical thrombectomy. Of note, patient's Eliquis was on hold ~ 1 week prior to admission due to recent hematoma. Pharmacy consulted to dose IV heparin for AFib.   Now transitioning patient from heparin to apixaban. H/H and Plt wnl. SCr wnl   Goal of Therapy:  Secondary stroke prevention Monitor platelets by anticoagulation protocol: Yes   Plan:  Stop heparin at 6 PM today and start Apixaban 5 mg twice daily  Monitor CBC and for s/s of bleeding   Thank you for involving pharmacy in this patient's care.  61, PharmD., BCPS, BCCCP Clinical Pharmacist Please refer to [facility] for unit-specific pharmacist

## 2020-11-18 NOTE — Progress Notes (Signed)
Patient converted back to A Fib.  CCM and cardiology paged.  No new orders at this time. RN to continue to monitor.

## 2020-11-18 NOTE — Progress Notes (Signed)
Progress Note  Patient Name: Beverly Li Date of Encounter: 11/18/2020  Primary Cardiologist: Donato Schultz, MD / EP - Graciela Husbands  Subjective   Went into NSR overnight.  More alert.    Inpatient Medications    Scheduled Meds:  arformoterol  15 mcg Nebulization BID   bethanechol  25 mg Per Tube TID   budesonide (PULMICORT) nebulizer solution  0.25 mg Nebulization BID   buPROPion  75 mg Per Tube BID   chlorhexidine  15 mL Mouth Rinse BID   Chlorhexidine Gluconate Cloth  6 each Topical Daily   ezetimibe  10 mg Per Tube Daily   feeding supplement (PROSource TF)  45 mL Per Tube BID   insulin aspart  0-9 Units Subcutaneous Q4H   mouth rinse  15 mL Mouth Rinse q12n4p   pantoprazole sodium  40 mg Per Tube QHS   polyethylene glycol  17 g Per Tube Daily   revefenacin  175 mcg Nebulization Daily   Continuous Infusions:  amiodarone 30 mg/hr (11/18/20 1000)   feeding supplement (OSMOLITE 1.2 CAL) 55 mL/hr at 11/17/20 1633   heparin 1,150 Units/hr (11/18/20 1000)   PRN Meds: acetaminophen **OR** acetaminophen (TYLENOL) oral liquid 160 mg/5 mL **OR** acetaminophen, albuterol, diltiazem, hydrALAZINE, hydrALAZINE, ondansetron (ZOFRAN) IV   Vital Signs    Vitals:   11/18/20 0900 11/18/20 0910 11/18/20 1000 11/18/20 1200  BP: (!) 168/69 (!) 168/69 (!) 144/69   Pulse: 75 74 72   Resp: (!) 26 (!) 22 (!) 23   Temp:    99 F (37.2 C)  TempSrc:    Axillary  SpO2: 92% 91% 92%   Weight:      Height:        Intake/Output Summary (Last 24 hours) at 11/18/2020 1256 Last data filed at 11/18/2020 1000 Gross per 24 hour  Intake 1191.77 ml  Output 600 ml  Net 591.77 ml   Last 3 Weights 11/16/2020 11/13/2020 09/12/2020  Weight (lbs) 119 lb 11.4 oz 113 lb 5.1 oz 109 lb  Weight (kg) 54.3 kg 51.4 kg 49.442 kg     Telemetry    Afib to NSR/SB 50s overnight - Personally Reviewed   Physical Exam   GEN: No  acute distress.   Neck: No  JVD Cardiac: RRR, no murmurs, rubs, or gallops.  Respiratory:  Clear   to auscultation bilaterally. GI: Soft, nontender, non-distended, normal bowel sounds  MS:  Mild diffuse edema; No deformity. Neuro:    Right hemiparesis    Labs    High Sensitivity Troponin:  No results for input(s): TROPONINIHS in the last 720 hours.    Cardiac EnzymesNo results for input(s): TROPONINI in the last 168 hours. No results for input(s): TROPIPOC in the last 168 hours.   Chemistry Recent Labs  Lab 11/13/20 1345 11/13/20 1351 11/16/20 0003 11/17/20 0505 11/18/20 0100  NA 141   < > 139 142 142  K 4.2   < > 3.8 4.1 4.3  CL 105   < > 109 111 111  CO2 27   < > 20* 24 25  GLUCOSE 88   < > 135* 115* 154*  BUN 24*   < > 29* 32* 32*  CREATININE 0.77   < > 0.67 0.66 0.62  CALCIUM 8.7*   < > 8.0* 8.2* 8.5*  PROT 5.7*  --   --   --   --   ALBUMIN 3.1*  --   --   --   --   AST  48*  --   --   --   --   ALT 25  --   --   --   --   ALKPHOS 60  --   --   --   --   BILITOT 1.7*  --   --   --   --   GFRNONAA >60   < > >60 >60 >60  ANIONGAP 9   < > 10 7 6    < > = values in this interval not displayed.     Hematology Recent Labs  Lab 11/16/20 0003 11/17/20 0505 11/18/20 0100  WBC 17.2* 14.2* 12.0*  RBC 4.02 4.06 3.86*  HGB 12.6 12.7 12.1  HCT 39.1 39.5 38.2  MCV 97.3 97.3 99.0  MCH 31.3 31.3 31.3  MCHC 32.2 32.2 31.7  RDW 19.2* 19.2* 19.1*  PLT 208 228 242    BNPNo results for input(s): BNP, PROBNP in the last 168 hours.   DDimer No results for input(s): DDIMER in the last 168 hours.   Radiology    No results found.  Cardiac Studies   2D Echo 11/14/20  1. There is no evidence of systolic anterior motion of the mitral valve  or true LV outflow obstruction on this study. LVOT velocities are mildly  elevated at 2 m/s. Left ventricular ejection fraction, by estimation, is  60 to 65%. The left ventricle has  low normal function. The left ventricle has no regional wall motion  abnormalities. There is moderate concentric left ventricular hypertrophy.   Left ventricular diastolic parameters are consistent with Grade II  diastolic dysfunction (pseudonormalization).  Elevated left atrial pressure.   2. Right ventricular systolic function is normal. The right ventricular  size is normal.   3. Left atrial size was severely dilated.   4. Right atrial size was mildly dilated.   5. The mitral valve is degenerative. Mild to moderate mitral valve  regurgitation.   6. The aortic valve is tricuspid. There is moderate calcification of the  aortic valve. There is mild thickening of the aortic valve. Aortic valve  regurgitation is mild. Mild to moderate aortic valve  sclerosis/calcification is present, without any  evidence of aortic stenosis.   7. The inferior vena cava is dilated in size with <50% respiratory  variability, suggesting right atrial pressure of 15 mmHg.   Comparison(s): Prior images reviewed side by side. The left ventricular  diastolic function is significantly worse. Elevated right and left atrial  pressures on the current study.   Patient Profile     75 y.o. female with complex history of persistent atrial fib (OAC recently held due to abdominal hematoma), HTN, HOCM, orthostatic hypotension, CAD with moderate LAD disease 2018, PVD s/p R fem-pop bpg 2016 who was admitted with stroke. Complex hx of afib as outlined - previously on amiodarone then Tikosyn, with recent discontinuation of Tikosyn due to persistent atrial fib as well as recent holding of Eliquis due to abdominal hematoma that occurred after coughing. (Had been tx for PNA shortly before.)  She was intubated and underwent successful thrombectomy with complete revasculatization, TICI 3 flow. MRI with L ACA/MCA territory infarcts and multiple small punctate infarcts. Slow improvement in neuro status. Cardiology consulted for afib during admission with difficulty controlling rates -> started on amiodarone.  Assessment & Plan    Persistent atrial fib  Now in NSR.  Stop IV  amiodarone.  Plan PO.  Start DOAC when OK with primary team.   L MCA Stroke Per primary  team  HOCM Echo 8/2 with normal EF, grade 2 DD, severe LAE, mild-moderate MR and mild AI to be followed clinically  CAD No active ischemic symptoms. Manage medically.    For questions or updates, please contact [facility] Please consult [email or web address] for contact info under Cardiology/STEMI.  Signed, Rollene Rotunda, MD 11/18/2020, 12:56 PM

## 2020-11-19 ENCOUNTER — Inpatient Hospital Stay (HOSPITAL_COMMUNITY): Payer: Medicare Other

## 2020-11-19 DIAGNOSIS — E43 Unspecified severe protein-calorie malnutrition: Secondary | ICD-10-CM | POA: Diagnosis not present

## 2020-11-19 DIAGNOSIS — I63512 Cerebral infarction due to unspecified occlusion or stenosis of left middle cerebral artery: Secondary | ICD-10-CM | POA: Diagnosis not present

## 2020-11-19 DIAGNOSIS — I4891 Unspecified atrial fibrillation: Secondary | ICD-10-CM | POA: Diagnosis not present

## 2020-11-19 DIAGNOSIS — R0682 Tachypnea, not elsewhere classified: Secondary | ICD-10-CM | POA: Diagnosis not present

## 2020-11-19 DIAGNOSIS — R0603 Acute respiratory distress: Secondary | ICD-10-CM | POA: Diagnosis not present

## 2020-11-19 DIAGNOSIS — I6602 Occlusion and stenosis of left middle cerebral artery: Secondary | ICD-10-CM | POA: Diagnosis not present

## 2020-11-19 LAB — POCT I-STAT 7, (LYTES, BLD GAS, ICA,H+H)
Acid-Base Excess: 7 mmol/L — ABNORMAL HIGH (ref 0.0–2.0)
Bicarbonate: 30.8 mmol/L — ABNORMAL HIGH (ref 20.0–28.0)
Calcium, Ion: 1.25 mmol/L (ref 1.15–1.40)
HCT: 40 % (ref 36.0–46.0)
Hemoglobin: 13.6 g/dL (ref 12.0–15.0)
O2 Saturation: 94 %
Patient temperature: 99.8
Potassium: 3.8 mmol/L (ref 3.5–5.1)
Sodium: 144 mmol/L (ref 135–145)
TCO2: 32 mmol/L (ref 22–32)
pCO2 arterial: 40.3 mmHg (ref 32.0–48.0)
pH, Arterial: 7.493 — ABNORMAL HIGH (ref 7.350–7.450)
pO2, Arterial: 66 mmHg — ABNORMAL LOW (ref 83.0–108.0)

## 2020-11-19 LAB — GLUCOSE, CAPILLARY
Glucose-Capillary: 157 mg/dL — ABNORMAL HIGH (ref 70–99)
Glucose-Capillary: 133 mg/dL — ABNORMAL HIGH (ref 70–99)
Glucose-Capillary: 139 mg/dL — ABNORMAL HIGH (ref 70–99)
Glucose-Capillary: 132 mg/dL — ABNORMAL HIGH (ref 70–99)
Glucose-Capillary: 147 mg/dL — ABNORMAL HIGH (ref 70–99)
Glucose-Capillary: 129 mg/dL — ABNORMAL HIGH (ref 70–99)

## 2020-11-19 MED ORDER — FUROSEMIDE 10 MG/ML IJ SOLN
40.0000 mg | Freq: Two times a day (BID) | INTRAMUSCULAR | Status: AC
  Administered 2020-11-19 – 2020-11-20 (×3): 40 mg via INTRAVENOUS
  Filled 2020-11-19 (×3): qty 4

## 2020-11-19 MED ORDER — FUROSEMIDE 10 MG/ML IJ SOLN
INTRAMUSCULAR | Status: AC
  Filled 2020-11-19: qty 4

## 2020-11-19 MED ORDER — AMIODARONE HCL IN DEXTROSE 360-4.14 MG/200ML-% IV SOLN
30.0000 mg/h | INTRAVENOUS | Status: DC
  Administered 2020-11-19 – 2020-11-21 (×4): 30 mg/h via INTRAVENOUS
  Filled 2020-11-19 (×5): qty 200

## 2020-11-19 MED ORDER — METOPROLOL TARTRATE 5 MG/5ML IV SOLN
5.0000 mg | Freq: Once | INTRAVENOUS | Status: AC
  Administered 2020-11-19: 5 mg via INTRAVENOUS
  Filled 2020-11-19: qty 5

## 2020-11-19 MED ORDER — AMIODARONE HCL IN DEXTROSE 360-4.14 MG/200ML-% IV SOLN
60.0000 mg/h | INTRAVENOUS | Status: AC
  Administered 2020-11-19: 60 mg/h via INTRAVENOUS
  Filled 2020-11-19: qty 200

## 2020-11-19 MED ORDER — FUROSEMIDE 10 MG/ML IJ SOLN
40.0000 mg | Freq: Two times a day (BID) | INTRAMUSCULAR | Status: DC
  Administered 2020-11-19: 40 mg via INTRAVENOUS

## 2020-11-19 MED ORDER — AMIODARONE LOAD VIA INFUSION
150.0000 mg | Freq: Once | INTRAVENOUS | Status: AC
  Administered 2020-11-19: 150 mg via INTRAVENOUS
  Filled 2020-11-19: qty 83.34

## 2020-11-19 MED ORDER — METOPROLOL TARTRATE 50 MG PO TABS
50.0000 mg | ORAL_TABLET | Freq: Two times a day (BID) | ORAL | Status: DC
  Administered 2020-11-19 – 2020-11-20 (×2): 50 mg
  Filled 2020-11-19 (×3): qty 1

## 2020-11-19 NOTE — Progress Notes (Signed)
Called to evaluate patient by Dr. Benjamin Stain Pola Corn).  Intermittent tachypnea.  Had been placed on CPAP on 8/6 during the day for work of breathing (?).   Had been on amio gtt stopped 8/6, started on amio PO 8/6.  Started on metop 25 BID 8/6 pm. Tikosin Heparin stopped 8/6 placed on eliquis.  Hx of COPD on pulmicort, brovana, yupelri  Improved now after lasix given, on HFNC for o2 support and PEEP.  CPAP stopped due to concern for aspiration given mental status (awake but not consistently following commands, weak).  ABG and CXR ordered.

## 2020-11-19 NOTE — Progress Notes (Signed)
eLink Physician-Brief Progress Note Patient Name: Beverly Li DOB: 03-27-1946 MRN: [id number]   Date of Service  11/19/2020  HPI/Events of Note  RN requests anxiolytic because patient has intermittent RR 20-30/min on BiPAP. Patient examined by me. She is s/p stroke with ongoing global aphasia. She is very weak in appearance. She was placed on BiPAP yesterday due to increased respiratory effort. I note that her CXR shows some areas of atelectasis/collapse, as well as increased vascular markings suggestive of volume overload. This is compatible with the patient's 2+ pitting edema in her bilateral lower extremities. She is +2.5L for the past day, and +2.5L additional for the day prior.   eICU Interventions  I do not think this patient can safely tolerate BiPAP in light of her neuro/mental status. She is at high risk of aspiration.  I do think some of her increased respiratory effort is related to volume overload. Will order Lasix 40 mg IV BID x2 doses, first dose to be given now.  Will order Optiflow 45 LPM for the patient given her increased work of breathing, with FiO2 to be titrated to achieve SpO2 92-96%.     Intervention Category Major Interventions: Respiratory failure - evaluation and management  Marveen Reeks Ghina Bittinger 11/19/2020, 2:27 AM

## 2020-11-19 NOTE — Progress Notes (Signed)
RN removed pt from CPAP due to pt having tachypnea. RT will cont to monitor.Pt currently resting on 2L [place] sat 96.

## 2020-11-19 NOTE — Progress Notes (Signed)
NAME:  Beverly Li, MRN:  [id number], DOB:  07/02/1945, LOS: 6 ADMISSION DATE:  11/13/2020, CONSULTATION DATE:  11/13/2020 REFERRING MD:  Derry Lory CHIEF COMPLAINT:  AMS   History of Present Illness:  75 year old woman with PMHx significant for Afib (on Eliquis, held x 1 week), AS, COPD, PAD, anxiety, GERD who presented to [facility] 8/1 with right-sided weakness, right facial droop and left gaze preference.    Of note, patient's AC recently held due to hospitalization for a large hematoma 2/2 significant coughing as a result of pneumonia. She was recently admitted to [facility] in [place]. [place] and while hospitalized went into Afib with RVR, kept for several days to manage. No medications were changed at discharge. She was advised to follow up with cardiology for AF management and when to restart anticoagulation.  CT Head on arrival to [facility] demonstrated signs of L MCA occlusion; patient was subsequently intubated for AMS and airway protection.  Taken to [facility] for thrombectomy with TICI 3 flow after 1 pass to L MCA M1 seg. Brought to ICU  on vent, Cleviprex and Propofol gtts.   PCCM consulted for help with management.  Significant Hospital Events: Including procedures, antibiotic start and stop dates in addition to other pertinent events   8/1 admitted, intubated, thrombectomy 8/2 Successful thrombectomy with complete revasculatization, TICI 3 flow. MRI with L ACA/MCA territory infarcts and multiple small punctate infarcts. Extubated late in day to [place]. 8/3 Afib on tele. Amio load + gtt started. Cortrak placement. 8/4 Slow improvement in neuro status, following more commands (LUE/LLE), tracking consistently, working with PT/OT/SLP. 8/5 patient converted to sinus rhythm, cardiology is following on amiodarone infusion 8/6 Patient remained in sinus bradycardia, cardiology recommended stopping beta-blocker and switching amiodarone to p.o Started with acute urinary retention, requiring  straight cath twice  Interim History / Subjective:  Patient went to A. fib with RVR again with heart rate ranging between 100-120 X-ray chest showed left lower lobe lobe lung collapse, patient started with some respiratory distress, was placed on BiPAP initially then switched to high flow nasal cannula  Objective   Blood pressure 136/87, pulse 89, temperature 98.1 F (36.7 C), temperature source Axillary, resp. rate 20, height 5\' 3"  (1.6 m), weight 57.3 kg, SpO2 97 %.    FiO2 (%):  [30 %] 30 %   Intake/Output Summary (Last 24 hours) at 11/19/2020 1318 Last data filed at 11/19/2020 1200 Gross per 24 hour  Intake 1752.28 ml  Output 2250 ml  Net -497.72 ml   Filed Weights   11/13/20 1300 11/16/20 0421 11/19/20 0429  Weight: 51.4 kg 54.3 kg 57.3 kg   Physical Examination: General: Acutely ill-appearing elderly woman, lying in the bed HEENT: [place]/AT, anicteric sclera, PERRL, moist mucous membranes. Neuro: Awake, aphasic.Tracking examiner  Unilateral R-sided neglect noted.  Right facial droop CV: Sinus bradycardia, no murmur appreciated PULM: Reduced air entry all over, fine crackles at the bases bilaterally, no wheezes GI: Soft, nontender, nondistended. Normoactive bowel sounds. Extremities: Trace LE edema noted. Skin: Warm/dry,  extensive ecchymosis to anterior lower abdomen/pelvic region.  Resolved Hospital Problem List:  Hypertensive emergency  Assessment & Plan:  Acute left MCA/ACA stroke s/p thrombectomy with TICI 3 Continue neuro watch Secondary stroke prophylaxis Now SBP goal <160  Acute hypoxemic respiratory failure in setting of large left MCA/ACA stroke History of COPD, not in flare Left lung collapse due to atelectasis Extubated 8/2. Wean supplemental O2 for sat > 90% Yesterday patient became tachypneic and grunting,  x-ray chest was done which showed left lower lobe lung collapse Started on BiPAP and, then it was transitioned to high flow nasal cannula Continue  Roxy Manns Pulmonary hygiene  Hypertension, better controlled Continue diltiazem, hydralazine  Paroxysmal A. fib with rapid ventricular response Hypertrophic cardiomyopathy Recent hematoma of unclear etiology requiring AC cessation Patient went back into A. fib with RVR heart rate ranging between 100-120 Cardiology is following Recommend reinitiating IV amiodarone infusion Started on metoprolol 50 mg every 12 hours Continue diltiazem IV heparin was switched to Eliquis for secondary stroke prophylaxis  Acute urinary retention Continue bladder scan and in and out catheterization Continue bethanechol to 25 mg 3 times daily   Best Practice (right click and "Reselect all SmartList Selections" daily)   Diet/type: tubefeeds and NPO DVT prophylaxis: Eliquis GI prophylaxis: PPI Lines: N/A Foley:  N/A Code Status:  full code Last date of multidisciplinary goals of care discussion: 8/7, updated patient's husband and her son at bedside.  Continue full aggressive care    Total critical care time: 32 minutes  Performed by: Cheri Fowler   Critical care time was exclusive of separately billable procedures and treating other patients.   Critical care was necessary to treat or prevent imminent or life-threatening deterioration.   Critical care was time spent personally by me on the following activities: development of treatment plan with patient and/or surrogate as well as nursing, discussions with consultants, evaluation of patient's response to treatment, examination of patient, obtaining history from patient or surrogate, ordering and performing treatments and interventions, ordering and review of laboratory studies, ordering and review of radiographic studies, pulse oximetry and re-evaluation of patient's condition.   Cheri Fowler MD [facility] See Amion for pager If no response to pager, please call [phone] until 7pm After 7pm, Please call E-link  [phone]

## 2020-11-19 NOTE — Progress Notes (Signed)
Progress Note  Patient Name: Beverly Li Date of Encounter: 11/19/2020  Primary Cardiologist: Donato Schultz, MD / EP - Graciela Husbands  Subjective   Increased work of breathing noted last night and treated with Lasix.   She went back into atrial fib yesterday.   Inpatient Medications    Scheduled Meds:  amiodarone  400 mg Per Tube BID   apixaban  5 mg Per Tube BID   arformoterol  15 mcg Nebulization BID   bethanechol  25 mg Per Tube TID   budesonide (PULMICORT) nebulizer solution  0.25 mg Nebulization BID   buPROPion  75 mg Per Tube BID   chlorhexidine  15 mL Mouth Rinse BID   Chlorhexidine Gluconate Cloth  6 each Topical Daily   ezetimibe  10 mg Per Tube Daily   feeding supplement (PROSource TF)  45 mL Per Tube BID   furosemide  40 mg Intravenous BID   insulin aspart  0-9 Units Subcutaneous Q4H   mouth rinse  15 mL Mouth Rinse q12n4p   metoprolol tartrate  25 mg Per Tube BID   pantoprazole sodium  40 mg Per Tube QHS   polyethylene glycol  17 g Per Tube Daily   revefenacin  175 mcg Nebulization Daily   Continuous Infusions:  feeding supplement (OSMOLITE 1.2 CAL) 1,000 mL (11/19/20 0415)   PRN Meds: acetaminophen **OR** acetaminophen (TYLENOL) oral liquid 160 mg/5 mL **OR** acetaminophen, albuterol, diltiazem, hydrALAZINE, hydrALAZINE, ondansetron (ZOFRAN) IV   Vital Signs    Vitals:   11/19/20 0700 11/19/20 0800 11/19/20 0835 11/19/20 0900  BP: (!) 144/88 (!) 144/81  (!) 137/95  Pulse: (!) 126 (!) 120  (!) 124  Resp: 16 20  (!) 28  Temp:  98.7 F (37.1 C)    TempSrc:  Axillary    SpO2: 96% 96% 97% 97%  Weight:      Height:        Intake/Output Summary (Last 24 hours) at 11/19/2020 0940 Last data filed at 11/19/2020 0700 Gross per 24 hour  Intake 1971.98 ml  Output 2250 ml  Net -278.02 ml   Last 3 Weights 11/19/2020 11/16/2020 11/13/2020  Weight (lbs) 126 lb 5.2 oz 119 lb 11.4 oz 113 lb 5.1 oz  Weight (kg) 57.3 kg 54.3 kg 51.4 kg     Telemetry    Atrial fib with RVR-  Personally Reviewed   Physical Exam   GEN: No  acute distress.   Neck: No  JVD Cardiac: Irregular RR, 2/6 apical systolic murmur, no diastolic murmurs, rubs, or gallops.  Respiratory: Clear   to auscultation bilaterally. GI: Soft, nontender, non-distended, normal bowel sounds  MS:  Trace leg edema; No deformity. Neuro:    Right sided hemiparesis Psych:    Awake and alert.  Tries to respond to questions.     Labs    High Sensitivity Troponin:  No results for input(s): TROPONINIHS in the last 720 hours.    Cardiac EnzymesNo results for input(s): TROPONINI in the last 168 hours. No results for input(s): TROPIPOC in the last 168 hours.   Chemistry Recent Labs  Lab 11/13/20 1345 11/13/20 1351 11/16/20 0003 11/17/20 0505 11/18/20 0100 11/19/20 0344  NA 141   < > 139 142 142 144  K 4.2   < > 3.8 4.1 4.3 3.8  CL 105   < > 109 111 111  --   CO2 27   < > 20* 24 25  --   GLUCOSE 88   < >  135* 115* 154*  --   BUN 24*   < > 29* 32* 32*  --   CREATININE 0.77   < > 0.67 0.66 0.62  --   CALCIUM 8.7*   < > 8.0* 8.2* 8.5*  --   PROT 5.7*  --   --   --   --   --   ALBUMIN 3.1*  --   --   --   --   --   AST 48*  --   --   --   --   --   ALT 25  --   --   --   --   --   ALKPHOS 60  --   --   --   --   --   BILITOT 1.7*  --   --   --   --   --   GFRNONAA >60   < > >60 >60 >60  --   ANIONGAP 9   < > 10 7 6   --    < > = values in this interval not displayed.     Hematology Recent Labs  Lab 11/16/20 0003 11/17/20 0505 11/18/20 0100 11/19/20 0344  WBC 17.2* 14.2* 12.0*  --   RBC 4.02 4.06 3.86*  --   HGB 12.6 12.7 12.1 13.6  HCT 39.1 39.5 38.2 40.0  MCV 97.3 97.3 99.0  --   MCH 31.3 31.3 31.3  --   MCHC 32.2 32.2 31.7  --   RDW 19.2* 19.2* 19.1*  --   PLT 208 228 242  --     BNPNo results for input(s): BNP, PROBNP in the last 168 hours.   DDimer No results for input(s): DDIMER in the last 168 hours.   Radiology    DG CHEST PORT 1 VIEW  Result Date: 11/19/2020 CLINICAL  DATA:  Tachypnea EXAM: PORTABLE CHEST 1 VIEW COMPARISON:  11/13/2020 FINDINGS: Cardiomegaly and aortic tortuosity. Haziness of the lower chest which could be atelectasis or pneumonia with small pleural effusions. There is worsening on the right at least where the diaphragm is no longer discretely visualized. No suspected pulmonary edema. No pneumothorax. Feeding tube at least reaches the stomach. Extensive artifact from EKG leads. IMPRESSION: Progressive hazy opacity at the bases which could be infection or atelectasis with small pleural effusions. Electronically Signed   By: 01/13/2021 M.D.   On: 11/19/2020 04:18   DG CHEST PORT 1 VIEW  Result Date: 11/18/2020 CLINICAL DATA:  Respiratory distress EXAM: PORTABLE CHEST 1 VIEW COMPARISON:  11/14/2020 FINDINGS: Interval extubation. The lungs remain well expanded though slightly decreased since prior examination. Partial left lower lobe collapse has developed. Tiny left pleural effusion is now present. Mild right basilar atelectasis noted. Mild cardiomegaly is stable. The thoracic aorta is tortuous and ectatic, likely stable when accounting for changes in patient positioning. Nasoenteric feeding tube extends into the upper abdomen beyond the margin of the examination. Pulmonary vascularity is normal. No acute bone abnormality. Partially calcified right breast implant again noted. IMPRESSION: Interval extubation with slight interval decrease in pulmonary insufflation. The lungs remain well expanded, however. Interval development of partial left lower lobe collapse and bibasilar atelectasis. Stable cardiomegaly. Grossly stable tortuosity and aneurysmal dilation of the thoracic aorta. Electronically Signed   By: 01/14/2021 MD   On: 11/18/2020 13:24    Cardiac Studies   2D Echo 11/14/20  1. There is no evidence of systolic anterior motion of the mitral valve  or  true LV outflow obstruction on this study. LVOT velocities are mildly  elevated at 2 m/s. Left  ventricular ejection fraction, by estimation, is  60 to 65%. The left ventricle has  low normal function. The left ventricle has no regional wall motion  abnormalities. There is moderate concentric left ventricular hypertrophy.  Left ventricular diastolic parameters are consistent with Grade II  diastolic dysfunction (pseudonormalization).  Elevated left atrial pressure.   2. Right ventricular systolic function is normal. The right ventricular  size is normal.   3. Left atrial size was severely dilated.   4. Right atrial size was mildly dilated.   5. The mitral valve is degenerative. Mild to moderate mitral valve  regurgitation.   6. The aortic valve is tricuspid. There is moderate calcification of the  aortic valve. There is mild thickening of the aortic valve. Aortic valve  regurgitation is mild. Mild to moderate aortic valve  sclerosis/calcification is present, without any  evidence of aortic stenosis.   7. The inferior vena cava is dilated in size with <50% respiratory  variability, suggesting right atrial pressure of 15 mmHg.   Comparison(s): Prior images reviewed side by side. The left ventricular  diastolic function is significantly worse. Elevated right and left atrial  pressures on the current study.   Patient Profile     75 y.o. female with complex history of persistent atrial fib (OAC recently held due to abdominal hematoma), HTN, HOCM, orthostatic hypotension, CAD with moderate LAD disease 2018, PVD s/p R fem-pop bpg 2016 who was admitted with stroke. Complex hx of afib as outlined - previously on amiodarone then Tikosyn, with recent discontinuation of Tikosyn due to persistent atrial fib as well as recent holding of Eliquis due to abdominal hematoma that occurred after coughing. (Had been tx for PNA shortly before.)  She was intubated and underwent successful thrombectomy with complete revasculatization, TICI 3 flow. MRI with L ACA/MCA territory infarcts and multiple small  punctate infarcts. Slow improvement in neuro status. Cardiology consulted for afib during admission with difficulty controlling rates -> started on amiodarone.  Went back into NSR and switch to PO amio  Assessment & Plan    Persistent atrial fib Back in fib.  I will restart the IV amiodarone.   L MCA Stroke Per primary team  HOCM Echo as above.   Noted to have some opacification on CXR with left lower lobe collapse.   Given Lasix and on IV bid currently.  She would be at risk for acute diastolic HF given the diastolic dysfunction above.  Watch intake output.  Continue diuretic.     CAD No active ischemic symptoms. Manage medically.    For questions or updates, please contact [facility] Please consult [email or web address] for contact info under Cardiology/STEMI.  Signed, Rollene Rotunda, MD 11/19/2020, 9:40 AM

## 2020-11-19 NOTE — Progress Notes (Signed)
STROKE TEAM PROGRESS NOTE    Interval History   Patient husband and Dr Merrily Pew are at the bedside. Pt eyes open, tracking bilaterally today, remains global aphasic. Still has afib and amiodarone IV resumed. Pt had respiratory distrss overnigh, initally on CPAP but discontinued, received lasix and improved. CXR yesterday showed partial lung collapse but today repeat CXR showed improvement. On lasix bid now.    Pertinent Lab Work and Imaging    11/13/20 CT Head WO IV Contrast 1. Mild asymmetric hypodensity of the left insula, concerning for acute left MCA territory infarct. An MRI could further evaluate. 2. Hyperdense appearance of the distal left M1 MCA, concerning for thrombus. Recommend CTA to further evaluate. 3. No acute hemorrhage. 4. Remote lacunar infarcts versus dilated perivascular spaces in bilateral inferior basal ganglia. Suspected remote lacunar infarct in the right cerebellum.  11/13/20 CT Angio Head and Neck W WO IV Contrast 1. Occlusion of the left M1 MCA with very little opacification of distal left MCA branches. Perfusion demonstrates the core infarct of 41 mL in the anterior left MCA territory with large (122 ml) of ischemic phenomena in the remaining left MCA and potentially left ACA territories. 2. Age indeterminate occlusion of the proximal right internal carotid artery in the neck with non-opacification through the carotid terminus. The right MCA and right posterior communicating artery are opacified via the of the right A1 ACA. 3. Suspected high-grade stenosis versus occlusion of the distal left A3/A4 ACA. 4. Approximately 40-50% stenosis of the proximal left ICA in the neck. 5. Severe stenosis of the non dominant/small right vertebral artery origin. Moderate stenosis of the intradural left vertebral artery. 6. Severe emphysema.  11/14/20 MRI Brain WO IV Contrast 1. 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 Associated edema without mass effect. 2.  Additional scattered punctate acute infarcts in the right frontoparietal cortex and small acute infarct in the right cerebellum.  11/14/20 Echocardiogram Complete   1. There is no evidence of systolic anterior motion of the mitral valve or true LV outflow obstruction on this study. LVOT velocities are mildly elevated at 2 m/s. Left ventricular ejection fraction, by estimation, is 60 to 65%. The left ventricle has low normal function. The left ventricle has no regional wall motion abnormalities. There is moderate concentric left ventricular hypertrophy. Left ventricular diastolic parameters are consistent with Grade II diastolic dysfunction (pseudonormalization). Elevated left atrial pressure.   2. Right ventricular systolic function is normal. The right ventricular size is normal.   3. Left atrial size was severely dilated.   4. Right atrial size was mildly dilated.   5. The mitral valve is degenerative. Mild to moderate mitral valve regurgitation.   6. The aortic valve is tricuspid. There is moderate calcification of the aortic valve. There is mild thickening of the aortic valve. Aortic valve regurgitation is mild. Mild to moderate aortic valve  sclerosis/calcification is present, without any evidence of aortic stenosis.   7. The inferior vena cava is dilated in size with <50% respiratory variability, suggesting right atrial pressure of 15 mmHg.   Physical Examination    Temp:  [98.1 F (36.7 C)-99.2 F (37.3 C)] 98.7 F (37.1 C) (08/07 0800) Pulse Rate:  [58-126] 100 (08/07 1000) Resp:  [16-38] 18 (08/07 1000) BP: (111-162)/(59-100) 146/97 (08/07 1000) SpO2:  [90 %-98 %] 98 % (08/07 1000) FiO2 (%):  [30 %] 30 % (08/07 0835) Weight:  [57.3 kg] 57.3 kg (08/07 0429)  General - Well nourished, well developed, in  mild respiratory distress.  Ophthalmologic - fundi not visualized due to noncooperation.  Cardiovascular - irregularly irregular heart rate and rhythm.  Neuro - awake, eyes  spontanousely open, tracking bilaterally, still has globa aphasia, nonverbal, not following commands, not able to name and repeat and read. Left gaze preference but able to cross midline. Mildly blinking to visual threat on the left but not on the right. Not tracking, PERRL. Right mild facial droop. Tongue protrusion not cooperative. LUE able to hold with no drift. LLE can hold at knee flexion and foot on bed position. RUE and RLE flaccid, right babinski positive. Sensation, coordination and gait not tested.    Assessment and Plan   Beverly Li is a 75 y.o. female w/pmh of AFib on Eliquis (off x 1 week for abdominal hematoma), COPD, remote tobacco abuse, hypertrophic cardiomyopathy, aortic stenosis, PAD who presents with right sided weakness, right facial droop, and left gaze preference. 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  MRI Brain revealed left MCA and ACA stroke.  CTA Head and Neck showed occlusion of the left MCA and an age indeterminate occlusion of the proximal right internal carotid artery in the neck with non-opacification through the carotid terminus.  Echo w/EF 60 to 65 %, left atrium severely dilated.  LDL 120  hemoglobin a1c 6.0.  Heparin IV transitioned to eliquis 11/18/20  #PAF Was on elqiuis  On hold PTA due to abdominal hematoma Heparin IV transitioned to eliquis 11/18/20 Afib RVR - cardiology on board Now on IV amiodarone  #Hypertension Diltiazem 30 mg QD, Nebivolol 10 mg QD and Hydralazine 25 mg Q8 at home.  Currently blood pressure goal is normotensive.  BP stable   R ICA stenosis per Dr. Corliss Skains, IR showed severe pre occlusive stenosis of the proximal right internal carotid artery associated with a delayed string sign found during cerebral angiogram, patient may require NIR intervention for the Rt  ICA stenosis in the future  Follow up with Dr. Corliss Skains as outpt   Abd hematoma - reportedly developed after coughing - Eliquis stopped in hospital in [place] - hematoma has greatly improved - tolerating IV heparin - will switch back to eliquis soon  #Hyperlipidemia LDL goal is < 70.  LDL is 120 Allergic to statin meds Continue home zetia  Respiratory distress  - Was intubated due to respiratory distress - extubated to 4L on 11/14/20.  - CCM managing  - still has mild respiratory distress - CXR 11/18/20 - partial lung collapse s/p CPAP -> d/c -> repeat CXR 11/19/20 with improvement - on lasix IV bid now - appreciate cardiology recs  Dysphagia  Currently NPO on NGT and TF @ 55 Speech following  Other stroke risk factors Advanced age Former smoker PAD  Other acute issue Leukocytosis - WBC 17.2->14.2->12.0   Hospital day # 6  This patient is critically ill due to stroke, PAF, respiratory failure, dysphagia and at significant risk of neurological worsening, death form respiratory failure, heart failure, recurrent stroke, hemorrhagic conversion, bleeding from DOAC, seizure. This patient's care requires constant monitoring of vital signs, hemodynamics, respiratory and cardiac monitoring, review of multiple databases, neurological assessment, discussion with family, other specialists and medical decision making of high complexity. I spent 40 minutes of neurocritical care time in the care of this patient. I had long  discussion with husbasnd at bedside, updated pt current condition, treatment plan and potential prognosis, and answered all the questions. He expressed understanding and appreciation. I also discussed with Dr. Merrily Pew and Dr. Claudie Fisherman.   Marvel Plan, MD PhD Stroke Neurology 11/19/2020 11:29 AM   To contact Stroke Continuity provider, please refer to [email or web address]. After hours, contact General Neurology

## 2020-11-19 NOTE — Progress Notes (Signed)
Flow titrated from 20L to 30L due to inc work of breathing. RT will cont to monitor.

## 2020-11-20 DIAGNOSIS — I480 Paroxysmal atrial fibrillation: Secondary | ICD-10-CM | POA: Diagnosis not present

## 2020-11-20 DIAGNOSIS — I4891 Unspecified atrial fibrillation: Secondary | ICD-10-CM | POA: Diagnosis not present

## 2020-11-20 DIAGNOSIS — I63512 Cerebral infarction due to unspecified occlusion or stenosis of left middle cerebral artery: Secondary | ICD-10-CM | POA: Diagnosis not present

## 2020-11-20 DIAGNOSIS — R0603 Acute respiratory distress: Secondary | ICD-10-CM | POA: Diagnosis not present

## 2020-11-20 DIAGNOSIS — I6602 Occlusion and stenosis of left middle cerebral artery: Secondary | ICD-10-CM | POA: Diagnosis not present

## 2020-11-20 DIAGNOSIS — E43 Unspecified severe protein-calorie malnutrition: Secondary | ICD-10-CM | POA: Diagnosis not present

## 2020-11-20 LAB — CBC
HCT: 42.8 % (ref 36.0–46.0)
Hemoglobin: 13.9 g/dL (ref 12.0–15.0)
MCH: 31.2 pg (ref 26.0–34.0)
MCHC: 32.5 g/dL (ref 30.0–36.0)
MCV: 96 fL (ref 80.0–100.0)
Platelets: 371 10*3/uL (ref 150–400)
RBC: 4.46 MIL/uL (ref 3.87–5.11)
RDW: 18.1 % — ABNORMAL HIGH (ref 11.5–15.5)
WBC: 12.3 10*3/uL — ABNORMAL HIGH (ref 4.0–10.5)
nRBC: 0 % (ref 0.0–0.2)

## 2020-11-20 LAB — BASIC METABOLIC PANEL
Anion gap: 9 (ref 5–15)
BUN: 39 mg/dL — ABNORMAL HIGH (ref 8–23)
CO2: 34 mmol/L — ABNORMAL HIGH (ref 22–32)
Calcium: 9 mg/dL (ref 8.9–10.3)
Chloride: 100 mmol/L (ref 98–111)
Creatinine, Ser: 0.8 mg/dL (ref 0.44–1.00)
GFR, Estimated: >60 mL/min (ref >60)
Glucose, Bld: 130 mg/dL — ABNORMAL HIGH (ref 70–99)
Potassium: 3.5 mmol/L (ref 3.5–5.1)
Sodium: 143 mmol/L (ref 135–145)

## 2020-11-20 LAB — GLUCOSE, CAPILLARY
Glucose-Capillary: 130 mg/dL — ABNORMAL HIGH (ref 70–99)
Glucose-Capillary: 140 mg/dL — ABNORMAL HIGH (ref 70–99)
Glucose-Capillary: 140 mg/dL — ABNORMAL HIGH (ref 70–99)
Glucose-Capillary: 143 mg/dL — ABNORMAL HIGH (ref 70–99)
Glucose-Capillary: 149 mg/dL — ABNORMAL HIGH (ref 70–99)
Glucose-Capillary: 151 mg/dL — ABNORMAL HIGH (ref 70–99)

## 2020-11-20 MED ORDER — POTASSIUM CHLORIDE 20 MEQ PO PACK
40.0000 meq | PACK | Freq: Three times a day (TID) | ORAL | Status: AC
  Administered 2020-11-20 (×3): 40 meq
  Filled 2020-11-20 (×5): qty 2

## 2020-11-20 NOTE — Progress Notes (Signed)
  Speech Language Pathology Treatment: Dysphagia;Cognitive-Linquistic  Patient Details Name: Beverly Li MRN: [id number] DOB: 29-Dec-1945 Today's Date: 11/20/2020 Time: 9169-4503 SLP Time Calculation (min) (ACUTE ONLY): 45 min  Assessment / Plan / Recommendation Clinical Impression  Beverly Li demonstrated progress since last seen by SLP. Today, she accepted ice chips with recognition, oral manipulation, active mastication, and initiation of a palpable swallow response.  There was intermittent oral holding and coughing, concerning for compromised airway protection, however, her active attempts to swallow are a notable improvement.  Discussed overall swallowing status with Beverly Li and their son at bedside; we discussed the ongoing need for cortrak and the potential for PEG, acknowledging that the decision about nutrition does not need to be made in the next few days.  Hopefully Beverly Li will be ready for an MBS by week's end.   Aphasia remains severe. Today the pt demonstrates improved tracking from left to right, improved attention to right side, and emerging ability to follow simple, contextual commands with repetition, modeling, and multimodal cues. She is beginning to vocalize with more spontaneity and per family, is starting to use some facial expressions to communicate.  Provided education re: the nature of aphasia and facilitating communication. Family verbalized understanding. SLP will continue to follow for communication and swallowing.    HPI HPI: 75 year old female with proximal A. 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 Intubated 8/1, extubated 8/2. MRI shows Acute infarcts involving the left basal ganglia, left insula, overlying  left frontal lobe and high left parasagittal frontal lobe cortex,  compatible with left ACA and MCA territory infarcts. Small areas of  acute infarct in the left frontal lobe white matter and punctate  left parietal cortical infarct, also  left ACA and MCA territories.  Associated edema without mass effect. Additional punctate acute  infarcts in right frontal and parietal cortex. Small acute right  cerebellar infarct. Partial lung collapse 8/7 requiring temporary CPAP.      SLP Plan  Continue with current plan of care       Recommendations  Diet recommendations: NPO                Oral Care Recommendations: Oral care QID Follow up Recommendations: Inpatient Rehab SLP Visit Diagnosis: Dysphagia, oropharyngeal phase (R13.12);Aphasia (R47.01) Plan: Continue with current plan of care       GO               Beverly Li L. Samson Frederic, MA CCC/SLP Acute Rehabilitation Services Office number [phone] Pager [phone]  [name] 11/20/2020, 11:53 AM

## 2020-11-20 NOTE — Progress Notes (Signed)
Patient currently resting on 4L [place] with spo2 95%.  [facility] is at bedside if needed. Patient is unable to tolerate CPAP. No respiratory distress noted on 4L [place]. RT will monitor as needed.

## 2020-11-20 NOTE — Progress Notes (Signed)
STROKE TEAM PROGRESS NOTE    Interval History   Patient husband, son and speech therapist are at the bedside. Pt eyes open, tracking bilaterally today, remains global aphasic. Still on amiodarone IV but now in sinus rhythm. Cardiology plan to transition amiodarone from IV to po this pm if remains sinus.  Neuro unchanged, still has mild respiratory distress   Pertinent Lab Work and Imaging    11/13/20 CT Head WO IV Contrast 1. Mild asymmetric hypodensity of the left insula, concerning for acute left MCA territory infarct. An MRI could further evaluate. 2. Hyperdense appearance of the distal left M1 MCA, concerning for thrombus. Recommend CTA to further evaluate. 3. No acute hemorrhage. 4. Remote lacunar infarcts versus dilated perivascular spaces in bilateral inferior basal ganglia. Suspected remote lacunar infarct in the right cerebellum.  11/13/20 CT Angio Head and Neck W WO IV Contrast 1. Occlusion of the left M1 MCA with very little opacification of distal left MCA branches. Perfusion demonstrates the core infarct of 41 mL in the anterior left MCA territory with large (122 ml) of ischemic phenomena in the remaining left MCA and potentially left ACA territories. 2. Age indeterminate occlusion of the proximal right internal carotid artery in the neck with non-opacification through the carotid terminus. The right MCA and right posterior communicating artery are opacified via the of the right A1 ACA. 3. Suspected high-grade stenosis versus occlusion of the distal left A3/A4 ACA. 4. Approximately 40-50% stenosis of the proximal left ICA in the neck. 5. Severe stenosis of the non dominant/small right vertebral artery origin. Moderate stenosis of the intradural left vertebral artery. 6. Severe emphysema.  11/14/20 MRI Brain WO IV Contrast 1. 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 Associated edema without mass effect. 2. Additional scattered punctate acute infarcts in the  right frontoparietal cortex and small acute infarct in the right cerebellum.  11/14/20 Echocardiogram Complete   1. There is no evidence of systolic anterior motion of the mitral valve or true LV outflow obstruction on this study. LVOT velocities are mildly elevated at 2 m/s. Left ventricular ejection fraction, by estimation, is 60 to 65%. The left ventricle has low normal function. The left ventricle has no regional wall motion abnormalities. There is moderate concentric left ventricular hypertrophy. Left ventricular diastolic parameters are consistent with Grade II diastolic dysfunction (pseudonormalization). Elevated left atrial pressure.   2. Right ventricular systolic function is normal. The right ventricular size is normal.   3. Left atrial size was severely dilated.   4. Right atrial size was mildly dilated.   5. The mitral valve is degenerative. Mild to moderate mitral valve regurgitation.   6. The aortic valve is tricuspid. There is moderate calcification of the aortic valve. There is mild thickening of the aortic valve. Aortic valve regurgitation is mild. Mild to moderate aortic valve  sclerosis/calcification is present, without any evidence of aortic stenosis.   7. The inferior vena cava is dilated in size with <50% respiratory variability, suggesting right atrial pressure of 15 mmHg.   Physical Examination    Temp:  [97.7 F (36.5 C)-98.6 F (37 C)] 98.6 F (37 C) (08/08 0800) Pulse Rate:  [51-114] 62 (08/08 0946) Resp:  [13-38] 17 (08/08 0900) BP: (125-170)/(67-116) 152/70 (08/08 0946) SpO2:  [88 %-97 %] 94 % (08/08 0900) FiO2 (%):  [30 %] 30 % (08/08 0816) Weight:  [54.3 kg] 54.3 kg (08/08 0500)  General - Well nourished, well developed, in mild respiratory distress.  Ophthalmologic -  fundi not visualized due to noncooperation.  Cardiovascular - Regular heart rate and rhythm, not in afib now.  Neuro - awake, eyes spontanousely open, tracking bilaterally, still has globa  aphasia, nonverbal, not following commands, not able to name and repeat and read. Left gaze preference but able to cross midline. Mildly blinking to visual threat on the left but not on the right. Not tracking, PERRL. Right mild facial droop. Tongue protrusion not cooperative. LUE able to hold with no drift. LLE can hold at knee flexion and foot on bed position. RUE and RLE slight withdraw to pain stimulation, bilateral babinski positive. Sensation, coordination and gait not tested.    Assessment and Plan   [name] is a 75 y.o. female w/pmh of AFib on Eliquis (off x 1 week for abdominal hematoma), COPD, remote tobacco abuse, hypertrophic cardiomyopathy, aortic stenosis, PAD who presents with right sided weakness, right facial droop, and left gaze preference. 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  MRI Brain revealed left MCA and ACA stroke.  CTA Head and Neck showed occlusion of the left MCA and an age indeterminate occlusion of the proximal right internal carotid artery in the neck with non-opacification through the carotid terminus.  Echo w/EF 60 to 65 %, left atrium severely dilated.  LDL 120  hemoglobin a1c 6.0.  Heparin IV transitioned to eliquis 11/18/20  #PAF Was on elqiuis, on hold PTA due to abdominal hematoma Heparin IV transitioned to eliquis 11/18/20 Afib RVR - cardiology on board Converted to sinus 8/8 Now on IV amiodarone -> plan to transition to po this pm if remain in sinus  #Hypertension Diltiazem 30 mg QD, Nebivolol 10 mg QD and Hydralazine 25 mg Q8 at home.  Currently blood pressure goal is normotensive.  BP stable   R ICA stenosis per Dr. Corliss Li, IR showed severe pre occlusive stenosis of the proximal right internal carotid artery associated with a delayed string sign found during cerebral  angiogram, patient may require NIR intervention for the Rt ICA stenosis in the future  Follow up with Dr. Corliss Li as outpt   Abd hematoma - reportedly developed after coughing - Eliquis stopped in hospital in [place] - hematoma has greatly improved - tolerating IV heparin - will switch back to eliquis soon  #Hyperlipidemia LDL goal is < 70.  LDL is 120 Allergic to statin meds Continue home zetia  Respiratory distress  - Was intubated due to respiratory distress - extubated to 4L on 11/14/20.  - CCM managing  - still has mild respiratory distress - CXR 11/18/20 - partial lung collapse s/p CPAP -> d/c -> repeat CXR 11/19/20 with improvement - on lasix bid  - appreciate cardiology recs  Dysphagia  Currently NPO on NGT and TF @ 55 Speech following  Other stroke risk factors Advanced age Former smoker PAD  Other acute issue Leukocytosis - WBC 17.2->14.2->12.0->12.3   Hospital day # 7  This patient is critically ill due to stroke, AF RVR, respiratory distress and at significant risk of neurological worsening, death form respiratory failure, heart failure, recurrent stroke, hemorrhagic conversion, seizure. This patient's care requires constant monitoring of vital signs, hemodynamics, respiratory and cardiac monitoring, review of multiple databases, neurological assessment, discussion with family, other specialists and medical decision making of high complexity. I spent 40 minutes of  neurocritical care time in the care of this patient. I had long discussion with husbasnd and son at bedside, updated pt current condition, treatment plan and potential prognosis, and answered all the questions. They expressed understanding and appreciation.   Marvel Plan, MD PhD Stroke Neurology 11/20/2020 11:56 AM   To contact Stroke Continuity provider, please refer to [email or web address]. After hours, contact General Neurology

## 2020-11-20 NOTE — Progress Notes (Signed)
NAME:  Beverly Li, MRN:  [id number], DOB:  [birth year], LOS: 7 ADMISSION DATE:  11/13/2020, CONSULTATION DATE:  11/13/2020 REFERRING MD:  Derry Lory CHIEF COMPLAINT:  AMS   History of Present Illness:  75 year old woman with PMHx significant for Afib (on Eliquis, held x 1 week), AS, COPD, PAD, anxiety, GERD who presented to [facility] 8/1 with right-sided weakness, right facial droop and left gaze preference.    Of note, patient's AC recently held due to hospitalization for a large hematoma 2/2 significant coughing as a result of pneumonia. She was recently admitted to [facility] in [place], [place] and while hospitalized went into Afib with RVR, kept for several days to manage. No medications were changed at discharge. She was advised to follow up with cardiology for AF management and when to restart anticoagulation.  CT Head on arrival to [facility] demonstrated signs of L MCA occlusion; patient was subsequently intubated for AMS and airway protection.  Taken to [facility] for thrombectomy with TICI 3 flow after 1 pass to L MCA M1 seg. Brought to ICU  on vent, Cleviprex and Propofol gtts.   PCCM consulted for assistance with management.  Significant Hospital Events: Including procedures, antibiotic start and stop dates in addition to other pertinent events   8/1 admitted, intubated, thrombectomy 8/2 Successful thrombectomy with complete revasculatization, TICI 3 flow. MRI with L ACA/MCA territory infarcts and multiple small punctate infarcts. Extubated late in day to [place]. 8/3 Afib on tele. Amio load + gtt started. Cortrak placement. 8/4 Slow improvement in neuro status, following more commands (LUE/LLE), tracking consistently, working with PT/OT/SLP. 8/5 patient converted to sinus rhythm, cardiology is following on amiodarone infusion 8/6 Patient remained in sinus bradycardia, cardiology recommended stopping beta-blocker and switching amiodarone to p.o 8/7 Started with acute urinary retention,  requiring straight cath twice. Afib with RVR. CXR with LLL collapse. Increased O2 requirement, could not tolerate BiPAP. Transitioned to HHFNC 30L/30% FiO2.  Interim History / Subjective:  Per patient's husband, tracking more over the weekend/today Episode of respiratory distress 8/7 with BiPAP attempt, could not tolerate Transitioned to [facility], tolerating well HR remains in 60s at present on current regiment Continuing amio, metop Working with therapies for goal CIR  Objective   Blood pressure (!) 152/70, pulse 62, temperature 98.6 F (37 C), temperature source Axillary, resp. rate 17, height 5\' 3"  (1.6 m), weight 54.3 kg, SpO2 94 %.    FiO2 (%):  [30 %] 30 %   Intake/Output Summary (Last 24 hours) at 11/20/2020 1203 Last data filed at 11/20/2020 1057 Gross per 24 hour  Intake 1909.81 ml  Output 1825 ml  Net 84.81 ml    Filed Weights   11/16/20 0421 11/19/20 0429 11/20/20 0500  Weight: 54.3 kg 57.3 kg 54.3 kg   Physical Examination: General: Acutely ill-appearing elderly woman in NAD. HEENT: [place]/AT, anicteric sclera, PERRL, moist mucous membranes. Neuro: Awake, unable to assess orientation. Tracking bilaterally across midline. Responds to verbal stimuli. Following commands intermittently with L side. Unilateral R-sided neglect noted.  CV: Mildly bradycardic. Harsh systolic murmur noted, III/VI. PULM: Breathing even and unlabored on HHFNC (30L, 30% FiO2). Lung fields with diminished air entry bilaterally, clear to auscultation in upper fields. GI: Soft, nontender, nondistended. Slightly hyperactive bowel sounds. Extremities: Trace symmetric BLE edema noted. Skin: Warm/dry, extensive ecchymosis to anterior lower abdomen/pelvic region.  Resolved Hospital Problem List:  Hypertensive emergency  Assessment & Plan:  Acute left MCA/ACA stroke s/p thrombectomy with TICI 3 - Stroke team primary -  Continue neuro watch - Secondary stroke px - Goal SBP < 160 - Continue therapies as  tolerated for goal CIR  Acute hypoxemic respiratory failure in setting of large left MCA/ACA stroke History of COPD, not in flare Left lung collapse due to atelectasis Extubated 8/2. - Continue HHFNC, unable to tolerate BiPAP - Wean supplemental O2 for sat > 90% - Pulmonary hygiene - Continue Brovana, Yupelri  Hypertension, better controlled - Continue metoprolol, diltiazem per Cardiology  Paroxysmal A. fib with rapid ventricular response Hypertrophic cardiomyopathy Recent hematoma of unclear etiology requiring AC cessation Patient went back into A. fib with RVR heart rate ranging between 100-120 8/7. - Cardiology following, appreciate recs - Continue amiodarone, transition to PO as able - Continue diltiazem + metoprolol BID for rate control - Continue Eliquis BID  Acute urinary retention - Bladder scans with I&O cath PRN for volume > - Continue bethanechol  Best Practice (right click and "Reselect all SmartList Selections" daily)   Diet/type: tubefeeds and NPO DVT prophylaxis: Eliquis GI prophylaxis: PPI Lines: N/A Foley:  N/A Code Status:  full code Last date of multidisciplinary goals of care discussion: 8/7, updated patient's husband and her son at bedside.  Continue full aggressive care  Critical care time: 30 minutes   Tim Lair, [facility] 11/20/20 12:13 PM  Please see [email or web address] for pager details.  From 7A-7P if no response, please call [phone] After hours, please call ELink [phone]

## 2020-11-20 NOTE — Progress Notes (Addendum)
Progress Note  Patient Name: Beverly Li Date of Encounter: 11/20/2020  [facility] Cardiologist: Donato Schultz, MD   Subjective   Converted to NSR - sinus bradycardia at 0023 last evening.  Nonverbal, looking around, tracks me with eyes  Inpatient Medications    Scheduled Meds:  apixaban  5 mg Per Tube BID   arformoterol  15 mcg Nebulization BID   bethanechol  25 mg Per Tube TID   budesonide (PULMICORT) nebulizer solution  0.25 mg Nebulization BID   buPROPion  75 mg Per Tube BID   chlorhexidine  15 mL Mouth Rinse BID   Chlorhexidine Gluconate Cloth  6 each Topical Daily   ezetimibe  10 mg Per Tube Daily   feeding supplement (PROSource TF)  45 mL Per Tube BID   insulin aspart  0-9 Units Subcutaneous Q4H   mouth rinse  15 mL Mouth Rinse q12n4p   metoprolol tartrate  50 mg Per Tube BID   pantoprazole sodium  40 mg Per Tube QHS   polyethylene glycol  17 g Per Tube Daily   revefenacin  175 mcg Nebulization Daily   Continuous Infusions:  amiodarone 30 mg/hr (11/19/20 2303)   feeding supplement (OSMOLITE 1.2 CAL) 1,000 mL (11/19/20 0415)   PRN Meds: acetaminophen **OR** acetaminophen (TYLENOL) oral liquid 160 mg/5 mL **OR** acetaminophen, albuterol, diltiazem, hydrALAZINE, hydrALAZINE, ondansetron (ZOFRAN) IV   Vital Signs    Vitals:   11/20/20 0700 11/20/20 0800 11/20/20 0816 11/20/20 0946  BP: (!) 150/67 140/75  (!) 152/70  Pulse: 64 62  62  Resp: 18 14    Temp:  98.6 F (37 C)    TempSrc:  Axillary    SpO2: (!) 88% 91% 94%   Weight:      Height:        Intake/Output Summary (Last 24 hours) at 11/20/2020 0951 Last data filed at 11/20/2020 0700 Gross per 24 hour  Intake 1575.16 ml  Output 1625 ml  Net -49.84 ml   Last 3 Weights 11/20/2020 11/19/2020 11/16/2020  Weight (lbs) 119 lb 11.4 oz 126 lb 5.2 oz 119 lb 11.4 oz  Weight (kg) 54.3 kg 57.3 kg 54.3 kg      Telemetry    Converted from Afib to sinus/sinus bradycardia at 0023 last evening - Personally  Reviewed  ECG    No new tracings - Personally Reviewed  Physical Exam   GEN: No acute distress.   Neck: No JVD Cardiac: RRR, no murmurs, rubs, or gallops.  Respiratory: Clear to auscultation bilaterally. MS: both legs wrapped, no edema above knees Neuro: facial droop, right sided weakness Psych: Normal affect   Labs    High Sensitivity Troponin:  No results for input(s): TROPONINIHS in the last 720 hours.    Chemistry Recent Labs  Lab 11/13/20 1345 11/13/20 1351 11/17/20 0505 11/18/20 0100 11/19/20 0344 11/20/20 0249  NA 141   < > 142 142 144 143  K 4.2   < > 4.1 4.3 3.8 3.5  CL 105   < > 111 111  --  100  CO2 27   < > 24 25  --  34*  GLUCOSE 88   < > 115* 154*  --  130*  BUN 24*   < > 32* 32*  --  39*  CREATININE 0.77   < > 0.66 0.62  --  0.80  CALCIUM 8.7*   < > 8.2* 8.5*  --  9.0  PROT 5.7*  --   --   --   --   --  ALBUMIN 3.1*  --   --   --   --   --   AST 48*  --   --   --   --   --   ALT 25  --   --   --   --   --   ALKPHOS 60  --   --   --   --   --   BILITOT 1.7*  --   --   --   --   --   GFRNONAA >60   < > >60 >60  --  >60  ANIONGAP 9   < > 7 6  --  9   < > = values in this interval not displayed.     Hematology Recent Labs  Lab 11/17/20 0505 11/18/20 0100 11/19/20 0344 11/20/20 0249  WBC 14.2* 12.0*  --  12.3*  RBC 4.06 3.86*  --  4.46  HGB 12.7 12.1 13.6 13.9  HCT 39.5 38.2 40.0 42.8  MCV 97.3 99.0  --  96.0  MCH 31.3 31.3  --  31.2  MCHC 32.2 31.7  --  32.5  RDW 19.2* 19.1*  --  18.1*  PLT 228 242  --  371    BNPNo results for input(s): BNP, PROBNP in the last 168 hours.   DDimer No results for input(s): DDIMER in the last 168 hours.   Radiology    DG CHEST PORT 1 VIEW  Result Date: 11/19/2020 CLINICAL DATA:  Tachypnea EXAM: PORTABLE CHEST 1 VIEW COMPARISON:  11/13/2020 FINDINGS: Cardiomegaly and aortic tortuosity. Haziness of the lower chest which could be atelectasis or pneumonia with small pleural effusions. There is worsening  on the right at least where the diaphragm is no longer discretely visualized. No suspected pulmonary edema. No pneumothorax. Feeding tube at least reaches the stomach. Extensive artifact from EKG leads. IMPRESSION: Progressive hazy opacity at the bases which could be infection or atelectasis with small pleural effusions. Electronically Signed   By: Marnee Spring M.D.   On: 11/19/2020 04:18   DG CHEST PORT 1 VIEW  Result Date: 11/18/2020 CLINICAL DATA:  Respiratory distress EXAM: PORTABLE CHEST 1 VIEW COMPARISON:  11/14/2020 FINDINGS: Interval extubation. The lungs remain well expanded though slightly decreased since prior examination. Partial left lower lobe collapse has developed. Tiny left pleural effusion is now present. Mild right basilar atelectasis noted. Mild cardiomegaly is stable. The thoracic aorta is tortuous and ectatic, likely stable when accounting for changes in patient positioning. Nasoenteric feeding tube extends into the upper abdomen beyond the margin of the examination. Pulmonary vascularity is normal. No acute bone abnormality. Partially calcified right breast implant again noted. IMPRESSION: Interval extubation with slight interval decrease in pulmonary insufflation. The lungs remain well expanded, however. Interval development of partial left lower lobe collapse and bibasilar atelectasis. Stable cardiomegaly. Grossly stable tortuosity and aneurysmal dilation of the thoracic aorta. Electronically Signed   By: Helyn Numbers MD   On: 11/18/2020 13:24    Cardiac Studies   Echo 11/14/20:  1. There is no evidence of systolic anterior motion of the mitral valve  or true LV outflow obstruction on this study. LVOT velocities are mildly  elevated at 2 m/s. Left ventricular ejection fraction, by estimation, is  60 to 65%. The left ventricle has  low normal function. The left ventricle has no regional wall motion  abnormalities. There is moderate concentric left ventricular hypertrophy.   Left ventricular diastolic parameters are consistent with Grade II  diastolic  dysfunction (pseudonormalization).  Elevated left atrial pressure.   2. Right ventricular systolic function is normal. The right ventricular  size is normal.   3. Left atrial size was severely dilated.   4. Right atrial size was mildly dilated.   5. The mitral valve is degenerative. Mild to moderate mitral valve  regurgitation.   6. The aortic valve is tricuspid. There is moderate calcification of the  aortic valve. There is mild thickening of the aortic valve. Aortic valve  regurgitation is mild. Mild to moderate aortic valve  sclerosis/calcification is present, without any  evidence of aortic stenosis.   7. The inferior vena cava is dilated in size with <50% respiratory  variability, suggesting right atrial pressure of 15 mmHg.   Patient Profile     75 y.o. female with complex history of persistent atrial fib (OAC recently held due to abdominal hematoma), HTN, HOCM, orthostatic hypotension, CAD with moderate LAD disease 2018, PVD s/p R fem-pop bpg 2016 who was admitted with stroke. Complex hx of afib as outlined - previously on amiodarone then Tikosyn, with recent discontinuation of Tikosyn due to persistent atrial fib as well as recent holding of Eliquis due to abdominal hematoma that occurred after coughing. (Had been tx for PNA shortly before.)   She was intubated and underwent successful thrombectomy with complete revasculatization, TICI 3 flow. MRI with L ACA/MCA territory infarcts and multiple small punctate infarcts. Slow improvement in neuro status. Cardiology consulted for afib during admission with difficulty controlling rates -> started on amiodarone.  Went back into NSR and switch to PO amio  Respiratory distress 11/18/20 treated with lasix. RVR treated with IV amiodarone, converted back to sinus 0023 11/20/20.   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  - amiodarone has been transitioned to PO dosing - RVR over the weekend with re-initiation of IV amiodarone - remains on amiodarone gtt + metoprolol 50 mg BID + cardizem 30 mg PRN - telemetry with converted to sinus 0023 11/20/20 - consider transitioning back to PO amiodarone this afternoon if she remains in sinus rhythm   Chronic anticoagulation - CVA when OAC was held for hematoma - continue eliquis - going forward, will be high risk off ov OAC for procedures, consider bridging - continue eliquis   Chronic diastolic heart failure Mild to moderate MR - echo with preserved EF but grade 2 DD, severely dilated left atrium - last lasix dose was yesterday - appears comfortable, do not appreciate edema, difficult to hear murmur   CAD Hypertension Hyperlipidemia with LDL goal < 70 - continue zetia - BP up in room, defer to neurology      For questions or updates, please contact [facility] Please consult [email or web address] for contact info under        Signed, Marcelino Duster, PA  11/20/2020, 9:51 AM    Attending Note:   The patient was seen and examined.  Agree with assessment and plan as noted above.  Changes made to the above note as needed.  Patient seen and independently examined with Bettina Gavia, PA .   We discussed all aspects of the encounter. I agree with the assessment and plan as stated above.     PAF:  has converted bck to sinus rhythm.  Cont IV amio for now.   Consider changing to PO today or tomorrow  Cont eliquis Cont IV amio for another day or so.  She went back  into Afib when we tried to change the IV amio to PO amio previously .   2.  Chronic diastolic CHF:   stable for now  3.  Neuro:   still has altered mental status.  Plans per neuro .    I have spent a total of 40 minutes with patient reviewing hospital  notes , telemetry, EKGs, labs and examining patient as well as establishing an assessment and plan that was discussed with the  patient.  > 50% of time was spent in direct patient care.    Vesta MixerPhilip J. Analise Glotfelty, Montez HagemanJr., MD, [facility] HospitalFACC 11/20/2020, 12:25 PM [address],  [address] Office [phone] Pager [phone]

## 2020-11-20 NOTE — Progress Notes (Addendum)
Physical Therapy Treatment Patient Details Name: Beverly Li MRN: [id number] DOB: Aug 21, 1945 Today's Date: 11/20/2020    History of Present Illness Pt is a 75 y.o. female who presented 11/13/20 with R sided weakness and L gaze preference. Outside window for tPA. 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 S/p thrombectomy with TICI 3 revascularization 8/1. ETT 8/1-8/2. 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 PMH: AFib on Eliquis (off x 1 week), COPD, remote tobacco abuse, hypertrophic cardiomyopathy, aortic stenosis, PAD, anxiety, arthritis, Bell's palsy, HTN, osteopenia, and GERD    PT Comments    Pt with improved visual tracking to R/L and command following; remains nonverbal. Noted R shoulder internal rotation spontaneously. Session focused on strengthening/ROM exercises, bed mobility and static/dynamic sitting balance. Pt requiring two person total assist for bed mobility. Demonstrates posterior lean sitting edge of bed. SpO2 90-94% on 30L via HHFNC, BP 152/70. Will continue to progress mobility as tolerated.    Follow Up Recommendations  CIR;Supervision/Assistance - 24 hour     Equipment Recommendations  3in1 (PT);Hospital bed (hoyer lift)    Recommendations for Other Services Rehab consult     Precautions / Restrictions Precautions Precautions: Fall Precaution Comments: cortrak, HFNC, R hemiplegia Restrictions Weight Bearing Restrictions: No    Mobility  Bed Mobility Overal bed mobility: Needs Assistance Bed Mobility: Supine to Sit;Sit to Supine     Supine to sit: +2 for physical assistance;Total assist Sit to supine: +2 for physical assistance;Total assist   General bed mobility comments: Decreased initiation, totalA + 2 for trunk/BLE management    Transfers                     Ambulation/Gait                 Stairs             Wheelchair Mobility    Modified Rankin (Stroke Patients Only) Modified Rankin (Stroke Patients Only) Pre-Morbid Rankin Score: No symptoms Modified Rankin: Severe disability     Balance Overall balance assessment: Needs assistance Sitting-balance support: Single extremity supported;No upper extremity supported;Feet supported Sitting balance-Leahy Scale: Zero Sitting balance - Comments: Mod-TA posteriorly sitting EOB Postural control: Posterior lean     Standing balance comment: Unable this date.                            Cognition Arousal/Alertness: Awake/alert Behavior During Therapy: Flat affect Overall Cognitive Status: Difficult to assess Area of Impairment: Following commands                   Current Attention Level: Focused   Following Commands: Follows one step commands with increased time;Follows one step commands inconsistently     Problem Solving: Slow processing;Difficulty sequencing;Decreased initiation General Comments: Pt following ~25-50% of 1 step commands, visually tracking to L/R, although less responsive and interactive when sitting up on edge of bed. No attempts at verbalizations.      Exercises General Exercises - Upper Extremity Shoulder Flexion: PROM;Right;10 reps;Supine General Exercises - Lower Extremity Ankle Circles/Pumps: PROM;Right;10 reps;Supine Short Arc Quad: AROM;PROM;Both;10 reps;Other (comment) (bed in chair position) Other Exercises Other Exercises: Visual scanning task locating family members on poster, handheld guidance to "point," to family member identified.  General Comments        Pertinent Vitals/Pain Pain Assessment: Faces Faces Pain Scale: Hurts a little bit Pain Location: general discomfort Pain Descriptors / Indicators: Grimacing;Guarding Pain Intervention(s): Limited activity within patient's tolerance;Monitored  during session    Home Living                      Prior Function            PT Goals (current goals can now be found in the care plan section) Acute Rehab PT Goals Patient Stated Goal: family like to continue to push for CIR at this time PT Goal Formulation: With family Time For Goal Achievement: 11/29/20 Potential to Achieve Goals: Fair Progress towards PT goals: Progressing toward goals    Frequency    Min 4X/week      PT Plan Current plan remains appropriate    Co-evaluation              AM-PAC PT "6 Clicks" Mobility   Outcome Measure  Help needed turning from your back to your side while in a flat bed without using bedrails?: Total Help needed moving from lying on your back to sitting on the side of a flat bed without using bedrails?: Total Help needed moving to and from a bed to a chair (including a wheelchair)?: Total Help needed standing up from a chair using your arms (e.g., wheelchair or bedside chair)?: Total Help needed to walk in hospital room?: Total Help needed climbing 3-5 steps with a railing? : Total 6 Click Score: 6    End of Session Equipment Utilized During Treatment: Gait belt;Oxygen Activity Tolerance: Patient tolerated treatment well Patient left: with call bell/phone within reach;in bed;with bed alarm set;with family/visitor present Nurse Communication: Mobility status PT Visit Diagnosis: Muscle weakness (generalized) (M62.81);Difficulty in walking, not elsewhere classified (R26.2);Other symptoms and signs involving the nervous system (R29.898);Hemiplegia and hemiparesis Hemiplegia - Right/Left: Right Hemiplegia - dominant/non-dominant: Dominant Hemiplegia - caused by: Cerebral infarction     Time: 1771-1657 PT Time Calculation (min) (ACUTE ONLY): 31 min  Charges:  $Therapeutic Activity: 23-37 mins                     Lillia Pauls, PT, DPT Acute Rehabilitation Services Pager [phone] Office  [phone]    Beverly Li 11/20/2020, 11:15 AM

## 2020-11-21 ENCOUNTER — Inpatient Hospital Stay (HOSPITAL_COMMUNITY): Payer: Medicare Other

## 2020-11-21 DIAGNOSIS — J810 Acute pulmonary edema: Secondary | ICD-10-CM

## 2020-11-21 DIAGNOSIS — R0603 Acute respiratory distress: Secondary | ICD-10-CM | POA: Diagnosis not present

## 2020-11-21 DIAGNOSIS — I480 Paroxysmal atrial fibrillation: Secondary | ICD-10-CM | POA: Diagnosis not present

## 2020-11-21 DIAGNOSIS — I6602 Occlusion and stenosis of left middle cerebral artery: Secondary | ICD-10-CM | POA: Diagnosis not present

## 2020-11-21 DIAGNOSIS — R0682 Tachypnea, not elsewhere classified: Secondary | ICD-10-CM | POA: Diagnosis not present

## 2020-11-21 DIAGNOSIS — I63512 Cerebral infarction due to unspecified occlusion or stenosis of left middle cerebral artery: Secondary | ICD-10-CM | POA: Diagnosis not present

## 2020-11-21 LAB — GLUCOSE, CAPILLARY
Glucose-Capillary: 162 mg/dL — ABNORMAL HIGH (ref 70–99)
Glucose-Capillary: 124 mg/dL — ABNORMAL HIGH (ref 70–99)
Glucose-Capillary: 87 mg/dL (ref 70–99)
Glucose-Capillary: 133 mg/dL — ABNORMAL HIGH (ref 70–99)
Glucose-Capillary: 115 mg/dL — ABNORMAL HIGH (ref 70–99)
Glucose-Capillary: 127 mg/dL — ABNORMAL HIGH (ref 70–99)

## 2020-11-21 LAB — BASIC METABOLIC PANEL
Anion gap: 6 (ref 5–15)
BUN: 43 mg/dL — ABNORMAL HIGH (ref 8–23)
CO2: 34 mmol/L — ABNORMAL HIGH (ref 22–32)
Calcium: 9 mg/dL (ref 8.9–10.3)
Chloride: 104 mmol/L (ref 98–111)
Creatinine, Ser: 0.76 mg/dL (ref 0.44–1.00)
GFR, Estimated: >60 mL/min (ref >60)
Glucose, Bld: 158 mg/dL — ABNORMAL HIGH (ref 70–99)
Potassium: 4.8 mmol/L (ref 3.5–5.1)
Sodium: 144 mmol/L (ref 135–145)

## 2020-11-21 LAB — CBC
HCT: 40.6 % (ref 36.0–46.0)
Hemoglobin: 13.1 g/dL (ref 12.0–15.0)
MCH: 31.6 pg (ref 26.0–34.0)
MCHC: 32.3 g/dL (ref 30.0–36.0)
MCV: 97.8 fL (ref 80.0–100.0)
Platelets: 372 10*3/uL (ref 150–400)
RBC: 4.15 MIL/uL (ref 3.87–5.11)
RDW: 18.3 % — ABNORMAL HIGH (ref 11.5–15.5)
WBC: 12 10*3/uL — ABNORMAL HIGH (ref 4.0–10.5)
nRBC: 0 % (ref 0.0–0.2)

## 2020-11-21 LAB — BRAIN NATRIURETIC PEPTIDE: B Natriuretic Peptide: 1286.5 pg/mL — ABNORMAL HIGH (ref 0.0–100.0)

## 2020-11-21 MED ORDER — JEVITY 1.2 CAL PO LIQD
1000.0000 mL | ORAL | Status: DC
  Administered 2020-11-21 – 2020-11-30 (×9): 1000 mL
  Administered 2020-11-30: 55 mL/h
  Filled 2020-11-21 (×14): qty 1000

## 2020-11-21 MED ORDER — FUROSEMIDE 10 MG/ML IJ SOLN
40.0000 mg | Freq: Once | INTRAMUSCULAR | Status: AC
  Administered 2020-11-21: 40 mg via INTRAVENOUS

## 2020-11-21 MED ORDER — METOPROLOL TARTRATE 25 MG PO TABS
25.0000 mg | ORAL_TABLET | Freq: Two times a day (BID) | ORAL | Status: DC
  Administered 2020-11-21 – 2020-11-29 (×17): 25 mg
  Filled 2020-11-21 (×17): qty 1

## 2020-11-21 MED ORDER — FUROSEMIDE 10 MG/ML IJ SOLN
INTRAMUSCULAR | Status: AC
  Administered 2020-11-21: 40 mg
  Filled 2020-11-21: qty 4

## 2020-11-21 MED ORDER — AMIODARONE HCL 200 MG PO TABS
400.0000 mg | ORAL_TABLET | Freq: Two times a day (BID) | ORAL | Status: DC
  Filled 2020-11-21: qty 2

## 2020-11-21 MED ORDER — AMIODARONE HCL 200 MG PO TABS
400.0000 mg | ORAL_TABLET | Freq: Two times a day (BID) | ORAL | Status: AC
  Administered 2020-11-21 – 2020-12-05 (×28): 400 mg
  Filled 2020-11-21 (×27): qty 2

## 2020-11-21 MED ORDER — AMIODARONE HCL 200 MG PO TABS: 200.0000 mg | ORAL_TABLET | Freq: Every day | ORAL | Status: DC

## 2020-11-21 NOTE — Progress Notes (Addendum)
Physical Therapy Treatment Patient Details Name: Beverly Li MRN: [id number] DOB: 1945-09-07 Today's Date: 11/21/2020    History of Present Illness Pt is a 75 y.o. female who presented 11/13/20 with R sided weakness and L gaze preference. Outside window for tPA. 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 S/p thrombectomy with TICI 3 revascularization 8/1. ETT 8/1-8/2. 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 PMH: AFib on Eliquis (off x 1 week), COPD, remote tobacco abuse, hypertrophic cardiomyopathy, aortic stenosis, PAD, anxiety, arthritis, Bell's palsy, HTN, osteopenia, and GERD    PT Comments    Pt following ~25% of one step commands; demonstrates improved tracking into right visual field. Pt requiring two person total assist for bed mobility. From edge of bed worked on midline positioning and anterior weight shift. Pt able to grip onto recliner handle with left hand with mild improvement. Utilized family pictures to promote visual scanning/tracking. SpO2 96-97% on 4L O2 throughout. Pt continues with use of accessory muscles for breathing. Will continue to promote mobility as tolerated.    Follow Up Recommendations  CIR;Supervision/Assistance - 24 hour     Equipment Recommendations  3in1 (PT);Hospital bed (hoyer lift)    Recommendations for Other Services       Precautions / Restrictions Precautions Precautions: Fall Precaution Comments: cortrak, R hemiplegia, Flexiseal, Brutus 4L Restrictions Weight Bearing Restrictions: No    Mobility  Bed Mobility Overal bed mobility: Needs Assistance Bed Mobility: Supine to Sit;Sit to Supine     Supine to sit: +2 for physical assistance;Total assist Sit to supine: +2 for physical assistance;Total assist   General bed mobility comments: no initiation,  total (A) to progress to eob. pt static sitting with L UE holding back of chair for static standing pulling against chair back. pt max (A) to min (A) sitting balance. pt noted to have R lateral rib collapse and L rib flare    Transfers                 General transfer comment: not appropriate at this time  Ambulation/Gait                 Stairs             Wheelchair Mobility    Modified Rankin (Stroke Patients Only)       Balance Overall balance assessment: Needs assistance Sitting-balance support: Single extremity supported;Feet supported Sitting balance-Leahy Scale: Zero   Postural control: Right lateral lean                                  Cognition Arousal/Alertness: Awake/alert Behavior During Therapy: Flat affect Overall Cognitive Status: Difficult to assess Area of Impairment: Following commands                   Current Attention Level: Focused   Following Commands: Follows one step commands inconsistently (generalized response but tracking with name call)     Problem Solving: Slow processing General Comments: pt following less than 25% with global aphasia. pt with some following 1 step commands with multimodal cues. Pt tracking toward R visual field more      Exercises General Exercises - Lower Extremity Ankle Circles/Pumps: PROM;Right;10 reps;Supine Heel Slides: PROM;Right;10 reps;Supine Other Exercises Other  Exercises: visual scanning past midline to the R multiple times during session Other Exercises: PROM shoulder flexion, abduction, scapula depression, wrist fleixon / extension, prom digits. noted to have edema present    General Comments General comments (skin integrity, edema, etc.): Aristes 4L      Pertinent Vitals/Pain Pain Assessment: Faces Faces Pain Scale: Hurts a little bit Pain Location: generalize discomfort Pain Descriptors / Indicators: Grimacing Pain Intervention(s): Monitored during  session;Repositioned    Home Living                      Prior Function            PT Goals (current goals can now be found in the care plan section) Acute Rehab PT Goals Patient Stated Goal: spouse like patient to go to CIR Potential to Achieve Goals: Fair    Frequency    Min 4X/week      PT Plan Current plan remains appropriate    Co-evaluation PT/OT/SLP Co-Evaluation/Treatment: Yes Reason for Co-Treatment: For patient/therapist safety;To address functional/ADL transfers;Necessary to address cognition/behavior during functional activity;Complexity of the patient's impairments (multi-system involvement) PT goals addressed during session: Mobility/safety with mobility;Balance OT goals addressed during session: ADL's and self-care;Proper use of Adaptive equipment and DME;Strengthening/ROM      AM-PAC PT "6 Clicks" Mobility   Outcome Measure  Help needed turning from your back to your side while in a flat bed without using bedrails?: Total Help needed moving from lying on your back to sitting on the side of a flat bed without using bedrails?: Total Help needed moving to and from a bed to a chair (including a wheelchair)?: Total Help needed standing up from a chair using your arms (e.g., wheelchair or bedside chair)?: Total Help needed to walk in hospital room?: Total Help needed climbing 3-5 steps with a railing? : Total 6 Click Score: 6    End of Session Equipment Utilized During Treatment: Oxygen Activity Tolerance: Patient tolerated treatment well Patient left: with call bell/phone within reach;in bed;with bed alarm set;with family/visitor present Nurse Communication: Mobility status PT Visit Diagnosis: Muscle weakness (generalized) (M62.81);Difficulty in walking, not elsewhere classified (R26.2);Other symptoms and signs involving the nervous system (R29.898);Hemiplegia and hemiparesis Hemiplegia - Right/Left: Right Hemiplegia - dominant/non-dominant:  Dominant Hemiplegia - caused by: Cerebral infarction     Time: 3329-5188 (time in room / session stopped for medical providers and chest xray arrival) PT Time Calculation (min) (ACUTE ONLY): 30 min  Charges:  $Therapeutic Activity: 8-22 mins                     Lillia Pauls, PT, DPT Acute Rehabilitation Services Pager [phone] Office [phone]    Norval Morton 11/21/2020, 1:10 PM

## 2020-11-21 NOTE — Progress Notes (Signed)
STROKE TEAM PROGRESS NOTE    Interval History   Patient husband and PT/OT as well as Dr. Denese Killings are at the bedside. Pt eyes open, tracking bilaterally today, remains global aphasic. Still on amiodarone IV but remained in sinus rhythm, and the plan to switch to p.o. tonight. Neuro unchanged, still has mild respiratory distress, continue diuresis per cardiology and CCM.   Pertinent Lab Work and Imaging    11/13/20 CT Head WO IV Contrast 1. Mild asymmetric hypodensity of the left insula, concerning for acute left MCA territory infarct. An MRI could further evaluate. 2. Hyperdense appearance of the distal left M1 MCA, concerning for thrombus. Recommend CTA to further evaluate. 3. No acute hemorrhage. 4. Remote lacunar infarcts versus dilated perivascular spaces in bilateral inferior basal ganglia. Suspected remote lacunar infarct in the right cerebellum.  11/13/20 CT Angio Head and Neck W WO IV Contrast 1. Occlusion of the left M1 MCA with very little opacification of distal left MCA branches. Perfusion demonstrates the core infarct of 41 mL in the anterior left MCA territory with large (122 ml) of ischemic phenomena in the remaining left MCA and potentially left ACA territories. 2. Age indeterminate occlusion of the proximal right internal carotid artery in the neck with non-opacification through the carotid terminus. The right MCA and right posterior communicating artery are opacified via the of the right A1 ACA. 3. Suspected high-grade stenosis versus occlusion of the distal left A3/A4 ACA. 4. Approximately 40-50% stenosis of the proximal left ICA in the neck. 5. Severe stenosis of the non dominant/small right vertebral artery origin. Moderate stenosis of the intradural left vertebral artery. 6. Severe emphysema.  11/14/20 MRI Brain WO IV Contrast 1. 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 Associated edema without mass effect. 2. Additional scattered punctate acute  infarcts in the right frontoparietal cortex and small acute infarct in the right cerebellum.  11/14/20 Echocardiogram Complete   1. There is no evidence of systolic anterior motion of the mitral valve or true LV outflow obstruction on this study. LVOT velocities are mildly elevated at 2 m/s. Left ventricular ejection fraction, by estimation, is 60 to 65%. The left ventricle has low normal function. The left ventricle has no regional wall motion abnormalities. There is moderate concentric left ventricular hypertrophy. Left ventricular diastolic parameters are consistent with Grade II diastolic dysfunction (pseudonormalization). Elevated left atrial pressure.   2. Right ventricular systolic function is normal. The right ventricular size is normal.   3. Left atrial size was severely dilated.   4. Right atrial size was mildly dilated.   5. The mitral valve is degenerative. Mild to moderate mitral valve regurgitation.   6. The aortic valve is tricuspid. There is moderate calcification of the aortic valve. There is mild thickening of the aortic valve. Aortic valve regurgitation is mild. Mild to moderate aortic valve  sclerosis/calcification is present, without any evidence of aortic stenosis.   7. The inferior vena cava is dilated in size with <50% respiratory variability, suggesting right atrial pressure of 15 mmHg.   Physical Examination    Temp:  [97.8 F (36.6 C)-98.9 F (37.2 C)] 98.6 F (37 C) (08/09 1600) Pulse Rate:  [51-61] 60 (08/09 1700) Resp:  [12-39] 15 (08/09 1700) BP: (131-182)/(62-108) 139/75 (08/09 1700) SpO2:  [93 %-97 %] 94 % (08/09 1700) Weight:  [50.9 kg] 50.9 kg (08/09 0600)  General - Well nourished, well developed, in mild respiratory distress.  Ophthalmologic - fundi not visualized due to noncooperation.  Cardiovascular - Regular heart rate and rhythm, not in afib now.  Neuro - awake, eyes spontanousely open, tracking bilaterally, still has globa aphasia, nonverbal,  not following commands, not able to name and repeat and read. Left gaze preference but able to cross midline. Mildly blinking to visual threat on the left but not on the right. Not tracking, PERRL. Right mild facial droop. Tongue protrusion not cooperative. LUE able to hold with no drift. LLE can hold at knee flexion and foot on bed position. RUE and RLE slight withdraw to pain stimulation, bilateral babinski positive. Sensation, coordination and gait not tested.    Assessment and Plan   [name] is a 75 y.o. female w/pmh of AFib on Eliquis (off x 1 week for abdominal hematoma), COPD, remote tobacco abuse, hypertrophic cardiomyopathy, aortic stenosis, PAD who presents with right sided weakness, right facial droop, and left gaze preference. 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  MRI Brain revealed left MCA and ACA stroke.  CTA Head and Neck showed occlusion of the left MCA and an age indeterminate occlusion of the proximal right internal carotid artery in the neck with non-opacification through the carotid terminus.  Echo w/EF 60 to 65 %, left atrium severely dilated.  LDL 120  hemoglobin a1c 6.0.  Heparin IV transitioned to eliquis 11/18/20  #PAF Was on elqiuis, on hold PTA due to abdominal hematoma Heparin IV transitioned to eliquis 11/18/20 Afib RVR - cardiology on board Converted to sinus 8/8 Now on IV amiodarone -> plan to transition to po tonight  #Hypertension Diltiazem 30 mg QD, Nebivolol 10 mg QD and Hydralazine 25 mg Q8 at home.  Currently blood pressure goal is normotensive.  BP stable   #R ICA stenosis per Dr. Corliss Skains, IR showed severe pre occlusive stenosis of the proximal right internal carotid artery associated with a delayed string sign found during cerebral angiogram, patient may require NIR intervention  for the Rt ICA stenosis in the future  Follow up with Dr. Corliss Skains as outpt   #Abd hematoma - reportedly developed after coughing - Eliquis stopped in hospital in [place] - hematoma has greatly improved - tolerating IV heparin - will switch back to eliquis soon  #Hyperlipidemia LDL goal is < 70.  LDL is 120 Allergic to statin meds Continue home zetia  #Respiratory distress  - Was intubated due to respiratory distress - extubated to 4L on 11/14/20.  - CCM managing  - still has mild respiratory distress - CXR 11/18/20 - partial lung collapse s/p CPAP -> d/c -> repeat CXR 11/19/20 with improvement - on lasix bid  - appreciate cardiology recs  Dysphagia  Currently NPO on NGT and TF @ 55 Speech following  Other stroke risk factors Advanced age Former smoker PAD  Other acute issue Leukocytosis - WBC 17.2->14.2->12.0->12.3->12.0   Hospital day # 8  This patient is critically ill due to stroke, A. fib RVR, respiratory distress, fluid overload and at significant risk of neurological worsening, death form respiratory failure, heart failure, recurrent stroke, seizure, hemorrhagic conversion. This patient's care requires constant monitoring of vital signs, hemodynamics, respiratory and cardiac monitoring, review of multiple databases, neurological assessment, discussion with family, other specialists and medical decision making of high complexity. I spent 35 minutes of neurocritical care time in the care of this patient.  I had long discussion with husbasnd at bedside, updated pt current condition, treatment plan and potential prognosis, and answered all the questions.  He expressed understanding and appreciation.   Marvel Plan, MD PhD Stroke Neurology 11/21/2020 6:11 PM   To contact Stroke Continuity provider, please refer to [email or web address]. After hours, contact General Neurology

## 2020-11-21 NOTE — Progress Notes (Addendum)
Progress Note  Patient Name: Beverly Li Date of Encounter: 11/21/2020  [facility] Cardiologist: Donato Schultz, MD   Subjective   Opens eyes to verbal/touch, does not answer questions or follow commands, moves LUE/LLE  Inpatient Medications    Scheduled Meds:  apixaban  5 mg Per Tube BID   arformoterol  15 mcg Nebulization BID   bethanechol  25 mg Per Tube TID   budesonide (PULMICORT) nebulizer solution  0.25 mg Nebulization BID   buPROPion  75 mg Per Tube BID   chlorhexidine  15 mL Mouth Rinse BID   Chlorhexidine Gluconate Cloth  6 each Topical Daily   ezetimibe  10 mg Per Tube Daily   feeding supplement (PROSource TF)  45 mL Per Tube BID   insulin aspart  0-9 Units Subcutaneous Q4H   mouth rinse  15 mL Mouth Rinse q12n4p   metoprolol tartrate  50 mg Per Tube BID   pantoprazole sodium  40 mg Per Tube QHS   polyethylene glycol  17 g Per Tube Daily   revefenacin  175 mcg Nebulization Daily   Continuous Infusions:  amiodarone 30 mg/hr (11/20/20 2320)   feeding supplement (OSMOLITE 1.2 CAL) 1,000 mL (11/19/20 0415)   PRN Meds: acetaminophen **OR** acetaminophen (TYLENOL) oral liquid 160 mg/5 mL **OR** acetaminophen, albuterol, diltiazem, hydrALAZINE, hydrALAZINE, ondansetron (ZOFRAN) IV   Vital Signs    Vitals:   11/21/20 0743 11/21/20 0745 11/21/20 0747 11/21/20 0800  BP:    (!) 167/78  Pulse:    61  Resp:    20  Temp:    98.5 F (36.9 C)  TempSrc:    Oral  SpO2: 96% 96% 96% 97%  Weight:      Height:        Intake/Output Summary (Last 24 hours) at 11/21/2020 4193 Last data filed at 11/21/2020 0000 Gross per 24 hour  Intake 823.96 ml  Output 1225 ml  Net -401.04 ml   Last 3 Weights 11/21/2020 11/20/2020 11/20/2020  Weight (lbs) 112 lb 3.4 oz 112 lb 3.4 oz 119 lb 11.4 oz  Weight (kg) 50.9 kg 50.9 kg 54.3 kg      Telemetry    In SR, Sinus brady since 08/07 pm - Personally Reviewed  ECG    No new tracings - Personally Reviewed  Physical Exam    General: Well developed, well nourished, female unable to answer questions, no acute distress Head: Eyes PERRLA, Head normocephalic and atraumatic, R facial droop noted Lungs: few rales bilaterally to auscultation, coarse rales and rhonchi upper airway Heart: HRRR S1 S2, without rub or gallop. 2/6 murmur. 4/4 extremity pulses are 2+ & equal. No JVD. Abdomen: Bowel sounds are present, abdomen soft and non-tender without masses or  hernias noted. Msk: R side hemiplegia Extremities: No clubbing, cyanosis, mild RUE edema.    Skin:  No rashes or lesions noted. Neuro: Alert when roused, no understandable speech Psych:  Good affect, responds appropriately  Labs    High Sensitivity Troponin:  No results for input(s): TROPONINIHS in the last 720 hours.    Chemistry Recent Labs  Lab 11/18/20 0100 11/19/20 0344 11/20/20 0249 11/21/20 0316  NA 142 144 143 144  K 4.3 3.8 3.5 4.8  CL 111  --  100 104  CO2 25  --  34* 34*  GLUCOSE 154*  --  130* 158*  BUN 32*  --  39* 43*  CREATININE 0.62  --  0.80 0.76  CALCIUM 8.5*  --  9.0  9.0  GFRNONAA >60  --  >60 >60  ANIONGAP 6  --  9 6     Hematology Recent Labs  Lab 11/18/20 0100 11/19/20 0344 11/20/20 0249 11/21/20 0316  WBC 12.0*  --  12.3* 12.0*  RBC 3.86*  --  4.46 4.15  HGB 12.1 13.6 13.9 13.1  HCT 38.2 40.0 42.8 40.6  MCV 99.0  --  96.0 97.8  MCH 31.3  --  31.2 31.6  MCHC 31.7  --  32.5 32.3  RDW 19.1*  --  18.1* 18.3*  PLT 242  --  371 372   Lab Results  Component Value Date   TSH 1.087 11/18/2020   Lab Results  Component Value Date   HGBA1C 6.0 (H) 11/13/2020   Lab Results  Component Value Date   CHOL 209 (H) 11/13/2020   HDL 70 11/13/2020   LDLCALC 120 (H) 11/13/2020   TRIG 97 11/13/2020   CHOLHDL 3.0 11/13/2020  ] BNPNo results for input(s): BNP, PROBNP in the last 168 hours.   DDimer No results for input(s): DDIMER in the last 168 hours.   Radiology    No results found.  Cardiac Studies   Echo  11/14/20:  1. There is no evidence of systolic anterior motion of the mitral valve or true LV outflow obstruction on this study. LVOT velocities are mildly elevated at 2 m/s. Left ventricular ejection fraction, by estimation, is 60 to 65%. The left ventricle has low normal function. The left ventricle has no regional wall motion abnormalities. There is moderate concentric left ventricular hypertrophy. Left ventricular diastolic parameters are consistent with Grade II diastolic dysfunction (pseudonormalization). Elevated left atrial pressure.   2. Right ventricular systolic function is normal. The right ventricular  size is normal.   3. Left atrial size was severely dilated.   4. Right atrial size was mildly dilated.   5. The mitral valve is degenerative. Mild to moderate mitral valve  regurgitation.   6. The aortic valve is tricuspid. There is moderate calcification of the  aortic valve. There is mild thickening of the aortic valve. Aortic valve  regurgitation is mild. Mild to moderate aortic valve sclerosis/calcification is present, without any evidence of aortic stenosis.   7. The inferior vena cava is dilated in size with <50% respiratory  variability, suggesting right atrial pressure of 15 mmHg.   Patient Profile     75 y.o. female with complex history of persistent atrial fib (OAC recently held due to abdominal hematoma), HTN, HOCM, orthostatic hypotension, CAD with moderate LAD disease 2018, PVD s/p R fem-pop bpg 2016 who was admitted with stroke. Complex hx of afib as outlined - previously on amiodarone then Tikosyn, with recent discontinuation of Tikosyn due to persistent atrial fib as well as recent holding of Eliquis due to abdominal hematoma that occurred after coughing. (Had been tx for PNA shortly before.)   She was intubated and underwent successful thrombectomy with complete revasculatization, TICI 3 flow. MRI with L ACA/MCA territory infarcts and multiple small punctate infarcts. Slow  improvement in neuro status. Cardiology consulted for afib during admission with difficulty controlling rates -> started on amiodarone.  Went back into NSR and switch to PO amio  Respiratory distress 11/18/20 treated with lasix. RVR treated with IV amiodarone, converted back to sinus 0023 11/20/20.   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  - has maintained SR > 24 hr after amio po>> IV  for RVR - generally more stable from a CVA standpoint - will change amio back to po dosing at 400 mg bid x 2 weeks, then 200 mg qd with pm dose - metoprolol 50 mg bid held last pm due to HR 55 bpm, HR just barely 60 now, will decrease dose to 25 mg bid and add parameters to give unless HR < 55 - continue prn Dilt 30 mg  Chronic anticoagulation - had an impressive abd hematoma after coughing (had PNA) and Eliquis held prior to CVA - Eliquis restarted 08/06 when cleared by Neuro - suspect she will need lovenox bridging for procedures in the future  Chronic diastolic heart failure Mild to moderate MR - clears secretions poorly, this affects her lung exam - JVD is mild - completed 3 doses IV Lasix 40 mg on 08/07 am -  I/O net + 9 L, but BUN up to 43 - discuss w/ MD starting scheduled po Lasix, was not on diuretic at home  CAD Hypertension Hyperlipidemia with LDL goal < 70 - on Zetia - need Neuro to define goal BP so we can add meds if needed      For questions or updates, please contact [facility] Please consult [email or web address] for contact info under       Signed, Theodore Demark, PA-C  11/21/2020, 9:18 AM    Attending Note:   The patient was seen and examined.  Agree with assessment and plan as noted above.  Changes made to the above note as needed.  Patient seen and independently examined with Theodore Demark, PA .   We discussed all aspects of the encounter. I agree with the assessment and plan as stated above.    Atrial fib:   is back in NSR .  Continue amio po / per NG tube .  Will DC IV amio today . Will anticipate the need to bridge with lovenox if she needs to stop her Eliquis in the future .   2. ? Volume overload:  the I/o suggest that she is 7.5 liters +.   She has diuresed quite a bit today .   I doubt that she is actually 7.5 liter overloaded.  Following    I have spent a total of 40 minutes with patient reviewing hospital  notes , telemetry, EKGs, labs and examining patient as well as establishing an assessment and plan that was discussed with the patient.  > 50% of time was spent in direct patient care.    Vesta Mixer, Montez Hageman., MD, [facility] 11/21/2020, 11:14 AM [address]  [address] Office [phone] Pager [phone]

## 2020-11-21 NOTE — Progress Notes (Signed)
  Speech Language Pathology Treatment: Dysphagia;Cognitive-Linquistic  Patient Details Name: Beverly Li MRN: [id number] DOB: Dec 26, 1945 Today's Date: 11/21/2020 Time: 4098-1191 SLP Time Calculation (min) (ACUTE ONLY): 21 min  Assessment / Plan / Recommendation Clinical Impression  Pt alert, mitt removed from left hand. She held cup in LUE and was offered ice chips, teaspoons of water which she accepted from left side with improved anticipation and receipt of POs, improved lip seal, and active mastication of ice chips.  There was less oral holding today and improved consistency of a palpable swallow response.  There was intermittent coughing s/p swallows, and frequent throat-clearing with limited ability to expel secretions.   Followed simple commands 60% of the time after modeling and with provision of tactile cues.  There was occasional voicing, good eye contact, and notable smiling on a few occasions.  Max cues with verbal/visual/tactile assist to phonate were not successful today.  SLP will continue to follow for aphasia and swallowing.  Continue NPO - pt may be ready for instrumental swallow study by the end of the week.   HPI HPI: 75 year old female with proximal A. 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 Intubated 8/1, extubated 8/2. MRI shows Acute infarcts involving the left basal ganglia, left insula, overlying  left frontal lobe and high left parasagittal frontal lobe cortex,  compatible with left ACA and MCA territory infarcts. Small areas of  acute infarct in the left frontal lobe white matter and punctate  left parietal cortical infarct, also left ACA and MCA territories.  Associated edema without mass effect. Additional punctate acute  infarcts in right frontal and parietal cortex. Small acute right  cerebellar infarct. Partial lung collapse 8/7 requiring temporary CPAP.      SLP Plan  Continue with current plan of care       Recommendations  Diet  recommendations: NPO Medication Administration: Via alternative means                Oral Care Recommendations: Oral care QID Follow up Recommendations: Inpatient Rehab SLP Visit Diagnosis: Dysphagia, oropharyngeal phase (R13.12);Aphasia (R47.01) Plan: Continue with current plan of care       GO               Clela Hagadorn L. Samson Frederic, MA CCC/SLP Acute Rehabilitation Services Office number [phone] Pager [phone]  [name] 11/21/2020, 2:40 PM

## 2020-11-21 NOTE — Progress Notes (Signed)
NAME:  Beverly Li, MRN:  [id number], DOB:  08-02-1945, LOS: 8 ADMISSION DATE:  11/13/2020, CONSULTATION DATE:  11/13/2020 REFERRING MD:  Derry Lory CHIEF COMPLAINT:  AMS   History of Present Illness:  75 year old woman with PMHx significant for Afib (on Eliquis, held x 1 week), AS, COPD, PAD, anxiety, GERD who presented to [facility] 8/1 with right-sided weakness, right facial droop and left gaze preference.    Of note, patient's AC recently held due to hospitalization for a large hematoma 2/2 significant coughing as a result of pneumonia. She was recently admitted to [facility] in [place]. [place] and while hospitalized went into Afib with RVR, kept for several days to manage. No medications were changed at discharge. She was advised to follow up with cardiology for AF management and when to restart anticoagulation.  CT Head on arrival to [facility] demonstrated signs of L MCA occlusion; patient was subsequently intubated for AMS and airway protection.  Taken to [facility] for thrombectomy with TICI 3 flow after 1 pass to L MCA M1 seg. Brought to ICU  on vent, Cleviprex and Propofol gtts.   PCCM consulted for assistance with management.  Significant Hospital Events: Including procedures, antibiotic start and stop dates in addition to other pertinent events   8/1 admitted, intubated, thrombectomy 8/2 Successful thrombectomy with complete revasculatization, TICI 3 flow. MRI with L ACA/MCA territory infarcts and multiple small punctate infarcts. Extubated late in day to [place]. 8/3 Afib on tele. Amio load + gtt started. Cortrak placement. 8/4 Slow improvement in neuro status, following more commands (LUE/LLE), tracking consistently, working with PT/OT/SLP. 8/5 patient converted to sinus rhythm, cardiology is following on amiodarone infusion 8/6 Patient remained in sinus bradycardia, cardiology recommended stopping beta-blocker and switching amiodarone to p.o 8/7 Started with acute urinary retention,  requiring straight cath twice. Afib with RVR. CXR with LLL collapse. Increased O2 requirement, could not tolerate BiPAP. Transitioned to [facility] 30L/30% FiO2. 8/8 Improving respiratory status, still volume up, working with therapies. 8/9 Weaned down to nasal cannula, tolerating well. Moving LUE/LLE purposefully. Following commands. Remains globally aphasic. Working with PT/OT. BNP 1200, additional diuresis today. CXR with atelectasis.  Interim History / Subjective:  Stable from the hemodynamic perspective Improved respiratory status Remains on [place], tolerating well IV Amio to PO today per Cardiology CXR with atelectasis Volume up on exam/based on I&Os Additional diuresis today Likely stable for progressive unit in the next 24-48H  Objective   Blood pressure (!) 146/76, pulse (!) 54, temperature 98.5 F (36.9 C), temperature source Oral, resp. rate 18, height 5\' 3"  (1.6 m), weight 50.9 kg, SpO2 96 %.    FiO2 (%):  [30 %] 30 %   Intake/Output Summary (Last 24 hours) at 11/21/2020 1211 Last data filed at 11/21/2020 1200 Gross per 24 hour  Intake 709.29 ml  Output 2125 ml  Net -1415.71 ml    Filed Weights   11/20/20 0500 11/20/20 1358 11/21/20 0600  Weight: 54.3 kg 50.9 kg 50.9 kg   Physical Examination: General: Chronically ill-appearing elderly woman in NAD. HEENT: [place]/AT, anicteric sclera, PERRL, moist mucous membranes. Neuro: Awake, unable to assess orientation secondary to global aphasia. Responds to verbal stimuli. Following commands consistently. Unilateral R-sided neglect noted. Moving LUE/LLE purposedully/to command. CV: RRR, no m/g/r. PULM: Breathing even and unlabored on [place]. Lung fields diminished at bilateral bases, +grunting. GI: Soft, nontender, nondistended. Normoactive bowel sounds. Extremities: Trace bilateral symmetric LE edema noted. Skin: Warm/dry, no rashes.  Resolved Hospital Problem List:  Hypertensive  emergency  Assessment & Plan:  Acute left MCA/ACA stroke  s/p thrombectomy with TICI 3 - Stroke team primary - Neuro watch - Secondary stroke prophylaxis - Goal SBP < 160 - Continue therapies as tolerate for goal CIR  Acute hypoxemic respiratory failure in setting of large left MCA/ACA stroke History of COPD, not in flare Left lung collapse due to atelectasis Extubated 8/2. - Continue [place] - Wean supplemental O2 for sat 88-02% - Continue Brovana, Yupelri - Pulmonary hygiene - BNP ~1200 - Lasix 40mg  IV today, 8/9  Hypertension, better controlled - Continue metoprolol, diltiazem per Cardiology  Paroxysmal A. fib with rapid ventricular response Hypertrophic cardiomyopathy Recent hematoma of unclear etiology requiring AC cessation Patient went back into A. fib with RVR heart rate ranging between 100-120 8/7. - Cardiology following, appreciate recs - Continue amiodarone, transition to PO today per Cards - Continue diltiazem, metoprolol BID for rate control - Continue Eliquis BID  Acute urinary retention - Bladder scans with I&O cath PRN for volume > 10/7 - Continue bethanechol  Best Practice (right click and "Reselect all SmartList Selections" daily)   Diet/type: tubefeeds and NPO DVT prophylaxis: Eliquis GI prophylaxis: PPI Lines: N/A Foley:  N/A Code Status:  full code Last date of multidisciplinary goals of care discussion: 8/9, updated patient's husband and her son at bedside.  Continue full aggressive care  Critical care time: N/A   10/9 [place] [facility] 11/21/20 12:11 PM  Please see [email or web address] for pager details.  From 7A-7P if no response, please call [phone] After hours, please call ELink [phone]

## 2020-11-21 NOTE — Progress Notes (Signed)
Initial Nutrition Assessment  DOCUMENTATION CODES:   Severe malnutrition in context of chronic illness  INTERVENTION:   Tube feeding via Cortrak tube: Change to [place]ev[place] 1.2 at 55 ml/h (1320 ml per day) Prosource TF 45 ml BID  Provides 1664 kcal, 95 gm protein, 1070 ml free water daily   NUTRITION DIAGNOSIS:   Severe Malnutrition related to chronic illness (COPD) as evidenced by severe muscle depletion, severe fat depletion. Ongoing.   GOAL:   Patient will meet greater than or equal to 90% of their needs Met with TF.   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    Pt discussed during ICU rounds and with RN. SLP following. Diurese today, weaning O2.   8/2 s/p thrombectomy with complete revascularization   8/3 cortrak placed; tip gastric   Medications reviewed and include: SSI, miralax Labs reviewed: BNP: 1286 CBG's: 87-162  Moderate edema  Diet Order:   Diet Order             Diet NPO time specified  Diet effective now                   EDUCATION NEEDS:   Not appropriate for education at this time  Skin:  Skin Assessment: Reviewed RN Assessment  Last BM:  325 ml via rectal tube + x 1  Height:   Ht Readings from Last 1 Encounters:  11/13/20 _0  (1.6 m)    Weight:   Wt Readings from Last 1 Encounters:  11/21/20 50.9 kg    BMI:  Body mass index is 19.88 kg/m.  Estimated Nutritional Needs:   Kcal:  1600-1800  Protein:  85-100 grams  Fluid:  >1.6 L/day  Lockie Pares., RD, LDN, CNSC See AMiON for contact information

## 2020-11-21 NOTE — Progress Notes (Addendum)
Occupational Therapy Treatment Patient Details Name: Beverly Li MRN: [id number] DOB: [birth year] Today's Date: 11/21/2020    History of present illness Pt is a 75 y.o. female who presented 11/13/20 with R sided weakness and L gaze preference. Outside window for tPA. 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 S/p thrombectomy with TICI 3 revascularization 8/1. ETT 8/1-8/2. 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 PMH: AFib on Eliquis (off x 1 week), COPD, remote tobacco abuse, hypertrophic cardiomyopathy, aortic stenosis, PAD, anxiety, arthritis, Bell's palsy, HTN, osteopenia, and GERD   OT comments  Pt continues to demonstrate accessory breathing with muscles at EOB. Pt with cardiology, critical medicine, neurology and chest xray just prior to therapy session showing activity tolerance. Pt tolerated eob sitting with activation of L UE pulling on chair to (A) with static sitting.   Follow Up Recommendations  CIR    Equipment Recommendations  3 in 1 bedside commode;Wheelchair (measurements OT);Wheelchair cushion (measurements OT);Hospital bed    Recommendations for Other Services Rehab consult    Precautions / Restrictions Precautions Precautions: Fall Precaution Comments: cortrak, R hemiplegia, Flexiseal, [place] 4L       Mobility Bed Mobility Overal bed mobility: Needs Assistance Bed Mobility: Supine to Sit;Sit to Supine     Supine to sit: +2 for physical assistance;Total assist     General bed mobility comments: no initiation, total (A) to progress to eob. pt static sitting with L UE holding back of chair for static standing pulling against chair back. pt max (A) to min (A) sitting balance. pt noted to have R lateral rib collapse and L rib flare    Transfers                 General  transfer comment: not appropriate at thsit ime    Balance Overall balance assessment: Needs assistance Sitting-balance support: Single extremity supported;Feet supported Sitting balance-Leahy Scale: Zero   Postural control: Right lateral lean                                 ADL either performed or assessed with clinical judgement   ADL Overall ADL's : Needs assistance/impaired Eating/Feeding: NPO                                     General ADL Comments: eob sitting and noted to have accessory muscle breathing. total (A) for all care at this time. pt does open mouth appropriately to yankauer     Vision       Perception     Praxis      Cognition Arousal/Alertness: Awake/alert Behavior During Therapy: Flat affect Overall Cognitive Status: Difficult to assess Area of Impairment: Following commands                   Current Attention Level: Focused   Following Commands: Follows one step commands inconsistently (generalized response but tracking with name call)     Problem Solving: Slow processing General Comments: pt following less than 25% with global aphasia. pt with some following 1 step commands with multimodal cues. Pt tracking toward R visual field more        Exercises Other  Exercises Other Exercises: visual scanning past midline to the R multiple times during session Other Exercises: PROM shoulder flexion, abduction, scapula depression, wrist fleixon / extension, prom digits. noted to have edema present   Shoulder Instructions       General Comments [place] 4L    Pertinent Vitals/ Pain       Pain Assessment: Faces Faces Pain Scale: Hurts a little bit Pain Location: generalize discomfort Pain Descriptors / Indicators: Grimacing Pain Intervention(s): Monitored during session;Repositioned  Home Living                                          Prior Functioning/Environment              Frequency   Min 2X/week        Progress Toward Goals  OT Goals(current goals can now be found in the care plan section)  Progress towards OT goals: Progressing toward goals;Goals drowngraded-see care plan  Acute Rehab OT Goals Patient Stated Goal: spouse like patient to go to CIR OT Goal Formulation: With patient/family Time For Goal Achievement: 11/29/20 Potential to Achieve Goals: Fair ADL Goals Pt Will Perform Grooming: sitting;with mod assist Additional ADL Goal #1: pt will follow 2 step command 50% of session Additional ADL Goal #2: pt will static sit eob for 5 minutes min (A) as precursor to adls  Plan Discharge plan remains appropriate    Co-evaluation    PT/OT/SLP Co-Evaluation/Treatment: Yes Reason for Co-Treatment: For patient/therapist safety;To address functional/ADL transfers;Complexity of the patient's impairments (multi-system involvement)   OT goals addressed during session: ADL's and self-care;Proper use of Adaptive equipment and DME;Strengthening/ROM      AM-PAC OT "6 Clicks" Daily Activity     Outcome Measure   Help from another person eating meals?: A Lot Help from another person taking care of personal grooming?: A Lot Help from another person toileting, which includes using toliet, bedpan, or urinal?: Total Help from another person bathing (including washing, rinsing, drying)?: Total Help from another person to put on and taking off regular upper body clothing?: Total Help from another person to put on and taking off regular lower body clothing?: Total 6 Click Score: 8    End of Session Equipment Utilized During Treatment: Oxygen  OT Visit Diagnosis: Unsteadiness on feet (R26.81);Muscle weakness (generalized) (M62.81);Hemiplegia and hemiparesis Hemiplegia - Right/Left: Right Hemiplegia - dominant/non-dominant: Dominant Hemiplegia - caused by: Cerebral infarction   Activity Tolerance Patient tolerated treatment well   Patient Left in bed;with call  bell/phone within reach;with bed alarm set;with family/visitor present;with SCD's reapplied;with restraints reapplied (mitten L hand)   Nurse Communication Mobility status;Precautions        Time: 9528-4132 ([phone]  time in room / session stopped for medical providers and chest xray arrival) OT Time Calculation (min): 30 min  Charges: OT General Charges $OT Visit: 1 Visit OT Treatments $Self Care/Home Management : 8-22 mins   Brynn, OTR/L  Acute Rehabilitation Services Pager: [phone] Office: [phone] .    Mateo Flow 11/21/2020, 12:00 PM

## 2020-11-22 ENCOUNTER — Inpatient Hospital Stay (HOSPITAL_COMMUNITY): Payer: Medicare Other

## 2020-11-22 DIAGNOSIS — I63512 Cerebral infarction due to unspecified occlusion or stenosis of left middle cerebral artery: Secondary | ICD-10-CM | POA: Diagnosis not present

## 2020-11-22 DIAGNOSIS — R0603 Acute respiratory distress: Secondary | ICD-10-CM | POA: Diagnosis not present

## 2020-11-22 DIAGNOSIS — I4891 Unspecified atrial fibrillation: Secondary | ICD-10-CM | POA: Diagnosis not present

## 2020-11-22 DIAGNOSIS — E43 Unspecified severe protein-calorie malnutrition: Secondary | ICD-10-CM | POA: Diagnosis not present

## 2020-11-22 DIAGNOSIS — I6602 Occlusion and stenosis of left middle cerebral artery: Secondary | ICD-10-CM | POA: Diagnosis not present

## 2020-11-22 DIAGNOSIS — I480 Paroxysmal atrial fibrillation: Secondary | ICD-10-CM | POA: Diagnosis not present

## 2020-11-22 LAB — GLUCOSE, CAPILLARY
Glucose-Capillary: 128 mg/dL — ABNORMAL HIGH (ref 70–99)
Glucose-Capillary: 149 mg/dL — ABNORMAL HIGH (ref 70–99)
Glucose-Capillary: 139 mg/dL — ABNORMAL HIGH (ref 70–99)
Glucose-Capillary: 130 mg/dL — ABNORMAL HIGH (ref 70–99)
Glucose-Capillary: 143 mg/dL — ABNORMAL HIGH (ref 70–99)
Glucose-Capillary: 123 mg/dL — ABNORMAL HIGH (ref 70–99)

## 2020-11-22 LAB — CBC
HCT: 39.1 % (ref 36.0–46.0)
Hemoglobin: 12.1 g/dL (ref 12.0–15.0)
MCH: 30.6 pg (ref 26.0–34.0)
MCHC: 30.9 g/dL (ref 30.0–36.0)
MCV: 98.7 fL (ref 80.0–100.0)
Platelets: 383 10*3/uL (ref 150–400)
RBC: 3.96 MIL/uL (ref 3.87–5.11)
RDW: 17.8 % — ABNORMAL HIGH (ref 11.5–15.5)
WBC: 10.3 10*3/uL (ref 4.0–10.5)
nRBC: 0 % (ref 0.0–0.2)

## 2020-11-22 LAB — BASIC METABOLIC PANEL
Anion gap: 8 (ref 5–15)
BUN: 39 mg/dL — ABNORMAL HIGH (ref 8–23)
CO2: 38 mmol/L — ABNORMAL HIGH (ref 22–32)
Calcium: 8.8 mg/dL — ABNORMAL LOW (ref 8.9–10.3)
Chloride: 99 mmol/L (ref 98–111)
Creatinine, Ser: 0.68 mg/dL (ref 0.44–1.00)
GFR, Estimated: >60 mL/min (ref >60)
Glucose, Bld: 118 mg/dL — ABNORMAL HIGH (ref 70–99)
Potassium: 4.2 mmol/L (ref 3.5–5.1)
Sodium: 145 mmol/L (ref 135–145)

## 2020-11-22 MED ORDER — HYDRALAZINE HCL 25 MG PO TABS: 25.0000 mg | ORAL_TABLET | Freq: Three times a day (TID) | ORAL | Status: DC

## 2020-11-22 MED ORDER — FUROSEMIDE 10 MG/ML IJ SOLN
40.0000 mg | Freq: Two times a day (BID) | INTRAMUSCULAR | Status: DC
  Administered 2020-11-22: 40 mg via INTRAVENOUS
  Filled 2020-11-22: qty 4

## 2020-11-22 MED ORDER — AMIODARONE HCL 200 MG PO TABS
200.0000 mg | ORAL_TABLET | Freq: Every day | ORAL | Status: DC
  Administered 2020-12-06: 200 mg
  Filled 2020-11-22: qty 1

## 2020-11-22 MED ORDER — HYDRALAZINE HCL 25 MG PO TABS
25.0000 mg | ORAL_TABLET | Freq: Three times a day (TID) | ORAL | Status: DC
  Administered 2020-11-22 – 2020-11-24 (×6): 25 mg
  Filled 2020-11-22 (×6): qty 1

## 2020-11-22 NOTE — Progress Notes (Signed)
NAME:  Beverly Li, MRN:  [id number], DOB:  May 18, 1945, LOS: 9 ADMISSION DATE:  11/13/2020, CONSULTATION DATE:  11/13/2020 REFERRING MD:  Derry Lory CHIEF COMPLAINT:  AMS   History of Present Illness:  75 year old woman with PMHx significant for Afib (on Eliquis, held x 1 week), AS, COPD, PAD, anxiety, GERD who presented to [facility] 8/1 with right-sided weakness, right facial droop and left gaze preference.    Of note, patient's AC recently held due to hospitalization for a large hematoma 2/2 significant coughing as a result of pneumonia. She was recently admitted to [facility] in [place]. [place] and while hospitalized went into Afib with RVR, kept for several days to manage. No medications were changed at discharge. She was advised to follow up with cardiology for AF management and when to restart anticoagulation.  CT Head on arrival to [facility] demonstrated signs of L MCA occlusion; patient was subsequently intubated for AMS and airway protection.  Taken to [facility] for thrombectomy with TICI 3 flow after 1 pass to L MCA M1 seg. Brought to ICU  on vent, Cleviprex and Propofol gtts.   PCCM consulted for assistance with management.  Significant Hospital Events: Including procedures, antibiotic start and stop dates in addition to other pertinent events   8/1 admitted, intubated, thrombectomy 8/2 Successful thrombectomy with complete revasculatization, TICI 3 flow. MRI with L ACA/MCA territory infarcts and multiple small punctate infarcts. Extubated late in day to [place]. 8/3 Afib on tele. Amio load + gtt started. Cortrak placement. 8/4 Slow improvement in neuro status, following more commands (LUE/LLE), tracking consistently, working with PT/OT/SLP. 8/5 patient converted to sinus rhythm, cardiology is following on amiodarone infusion 8/6 Patient remained in sinus bradycardia, cardiology recommended stopping beta-blocker and switching amiodarone to p.o 8/7 Started with acute urinary retention,  requiring straight cath twice. Afib with RVR. CXR with LLL collapse. Increased O2 requirement, could not tolerate BiPAP. Transitioned to [facility] 30L/30% FiO2. 8/8 Improving respiratory status, still volume up, working with therapies. 8/9 Weaned down to nasal cannula, tolerating well. Moving LUE/LLE purposefully. Following commands. Remains globally aphasic. Working with PT/OT. BNP 1200, additional diuresis today. CXR with atelectasis. 8/10 Desaturations overnight/early AM to 81%. 3L [place] -> HFNC -> Venturi mask. CXR with small bilateral pleural effusions and atelectasis, improved from prior. Suspect desats mainly positional. Lasix 40mg  IV x 1 given.  Interim History / Subjective:  Remains hemodynamically stable Respiratory status a bit tenuous Desaturations overnight/early AM to 81% 3L [place] -> HFNC -> Venturi mask CXR with small bilateral pleural effusions and atelectasis, improved from prior Suspect desats mainly positional Lasix 40mg  IV given x 1  Objective   Blood pressure (!) 176/78, pulse 62, temperature 98.8 F (37.1 C), temperature source Axillary, resp. rate 19, height 5\' 3"  (1.6 m), weight 51.3 kg, SpO2 97 %.    FiO2 (%):  [50 %] 50 %   Intake/Output Summary (Last 24 hours) at 11/22/2020 1238 Last data filed at 11/22/2020 1100 Gross per 24 hour  Intake 937.75 ml  Output 2950 ml  Net -2012.25 ml    Filed Weights   11/20/20 1358 11/21/20 0600 11/22/20 0500  Weight: 50.9 kg 50.9 kg 51.3 kg   Physical Examination: General: Acutely ill-appearing elderly woman in NAD. HEENT: [place]/AT, anicteric sclera, PERRL, moist mucous membranes. Neuro: Awake, unable to assess orientation. Tracking with eyes bilaterally. Responds to verbal stimuli. Following commands consistently (with LUE/LLE only). Unilateral R-sided neglect noted.  CV: Mildly bradycardic, regular rhythm, harsh systolic murmur III/VI noted.  PULM: Breathing even and minimally labored on Venturi mask. Lung fields with bibasilar  crackles. GI: Soft, nontender, nondistended. Normoactive bowel sounds. Extremities: Trace symmetric BLE edema noted. Skin: Warm/dry, no rashes.  Resolved Hospital Problem List:  Hypertensive emergency  Assessment & Plan:  Acute left MCA/ACA stroke s/p thrombectomy with TICI 3 - Stroke team primary - Neuro watch - Secondary stroke PPX - Goal SBP < 160 - Continue therapies as tolerated for goal CIR  Acute hypoxemic respiratory failure in setting of large left MCA/ACA stroke History of COPD, not in flare Left lung collapse due to atelectasis Extubated 8/2. Variable respiratory status/O2 requirements since extubation. - Continue supplemental O2, weaning for sat 88-92% - Continue Brovana, Yupelri - Aggressive pulmonary hygiene, Chest PT - Follow intermittent CXR - Diuresis as tolerated, Lasix 40mg  x 1 today 8/10  Hypertension, better controlled - Continue metop, diltiazem per Cardiology  Paroxysmal A. fib with rapid ventricular response Hypertrophic cardiomyopathy Recent hematoma of unclear etiology requiring AC cessation Patient went back into A. fib with RVR heart rate ranging between 100-120 8/7. - Cardiology following, appreciate recs - Amiodarone PO - Diltiazem, metoprolol BID for rate - Zetia, as allergic to statins - Eliquis BID  Acute urinary retention - Bladder scans with I&O cath PRN for volume > 10/7 - Discontinue bethanechol  Best Practice (right click and "Reselect all SmartList Selections" daily)   Diet/type: tubefeeds and NPO DVT prophylaxis: Eliquis GI prophylaxis: PPI Lines: N/A Foley:  N/A Code Status:  full code Last date of multidisciplinary goals of care discussion: 8/9, updated patient's husband and her son at bedside.  Continue full aggressive care  Critical care time: N/A   10/9 [facility] 11/22/20 12:38 PM  Please see [email or web address] for pager details.  From 7A-7P if no response, please call  [phone] After hours, please call ELink [phone]

## 2020-11-22 NOTE — TOC Progression Note (Signed)
Transition of Care [facility]) - Progression Note    Patient Details  Name: Beverly Li MRN: [id number] Date of Birth: May 20, 1945  [facility]) CM/SW Contact  Mearl Latin, LCSW Phone Number: 11/22/2020, 10:24 AM  Clinical Narrative:    CSW continuing to follow for medical readiness and discharge planning.      Barriers to Discharge: Continued Medical Work up  Expected Discharge Plan and Services   In-house Referral: Clinical Social Work   Post Acute Care Choice: Skilled Nursing Facility Living arrangements for the past 2 months: Single Family Home                                       Social Determinants of Health (SDOH) Interventions    Readmission Risk Interventions No flowsheet data found.

## 2020-11-22 NOTE — Progress Notes (Signed)
STROKE TEAM PROGRESS NOTE   Interval History   Patient husband and RN are at the bedside. Pt eyes open, tracking bilaterally, remains global aphasic. She is on O2 mask today due to earlier transient desaturation. Currently O2 sat 97%. Breathing less labored as yesterday but still has weak cough and rattling sound in the throat. In sinus rhythm, not in afib. UOP not high, continue diuresis.    Pertinent Lab Work and Imaging    11/13/20 CT Head WO IV Contrast 1. Mild asymmetric hypodensity of the left insula, concerning for acute left MCA territory infarct. An MRI could further evaluate. 2. Hyperdense appearance of the distal left M1 MCA, concerning for thrombus. Recommend CTA to further evaluate. 3. No acute hemorrhage. 4. Remote lacunar infarcts versus dilated perivascular spaces in bilateral inferior basal ganglia. Suspected remote lacunar infarct in the right cerebellum.  11/13/20 CT Angio Head and Neck W WO IV Contrast 1. Occlusion of the left M1 MCA with very little opacification of distal left MCA branches. Perfusion demonstrates the core infarct of 41 mL in the anterior left MCA territory with large (122 ml) of ischemic phenomena in the remaining left MCA and potentially left ACA territories. 2. Age indeterminate occlusion of the proximal right internal carotid artery in the neck with non-opacification through the carotid terminus. The right MCA and right posterior communicating artery are opacified via the of the right A1 ACA. 3. Suspected high-grade stenosis versus occlusion of the distal left A3/A4 ACA. 4. Approximately 40-50% stenosis of the proximal left ICA in the neck. 5. Severe stenosis of the non dominant/small right vertebral artery origin. Moderate stenosis of the intradural left vertebral artery. 6. Severe emphysema.  11/14/20 MRI Brain WO IV Contrast 1. 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 Associated edema without mass effect. 2. Additional  scattered punctate acute infarcts in the right frontoparietal cortex and small acute infarct in the right cerebellum.  11/14/20 Echocardiogram Complete   1. There is no evidence of systolic anterior motion of the mitral valve or true LV outflow obstruction on this study. LVOT velocities are mildly elevated at 2 m/s. Left ventricular ejection fraction, by estimation, is 60 to 65%. The left ventricle has low normal function. The left ventricle has no regional wall motion abnormalities. There is moderate concentric left ventricular hypertrophy. Left ventricular diastolic parameters are consistent with Grade II diastolic dysfunction (pseudonormalization). Elevated left atrial pressure.   2. Right ventricular systolic function is normal. The right ventricular size is normal.   3. Left atrial size was severely dilated.   4. Right atrial size was mildly dilated.   5. The mitral valve is degenerative. Mild to moderate mitral valve regurgitation.   6. The aortic valve is tricuspid. There is moderate calcification of the aortic valve. There is mild thickening of the aortic valve. Aortic valve regurgitation is mild. Mild to moderate aortic valve  sclerosis/calcification is present, without any evidence of aortic stenosis.   7. The inferior vena cava is dilated in size with <50% respiratory variability, suggesting right atrial pressure of 15 mmHg.   Physical Examination    Temp:  [97.9 F (36.6 C)-99.3 F (37.4 C)] 98.8 F (37.1 C) (08/10 1200) Pulse Rate:  [54-63] 62 (08/10 1000) Resp:  [14-21] 19 (08/10 1000) BP: (121-183)/(58-95) 176/78 (08/10 1000) SpO2:  [85 %-97 %] 97 % (08/10 1000) FiO2 (%):  [50 %] 50 % (08/10 0853) Weight:  [51.3 kg] 51.3 kg (08/10 0500)  General - Well nourished, well  developed, on O2 mask, breathing less labored.   Ophthalmologic - fundi not visualized due to noncooperation.  Cardiovascular - Regular heart rate and rhythm, not in afib.  Neuro - awake, eyes spontanousely  open, tracking bilaterally, still has globa aphasia, nonverbal, not following commands, not able to name and repeat and read. Left gaze preference but able to cross midline. Mildly blinking to visual threat on the left but not on the right. Not tracking, PERRL. Right mild facial droop. Tongue protrusion not cooperative. LUE able to hold with no drift. LLE can hold at knee flexion and foot on bed position. RUE and RLE slight withdraw to pain stimulation, bilateral babinski positive. Sensation, coordination and gait not tested.    Assessment and Plan   Beverly Li is a 75 y.o. female w/pmh of AFib on Eliquis (off x 1 week for abdominal hematoma), COPD, remote tobacco abuse, hypertrophic cardiomyopathy, aortic stenosis, PAD who presents with right sided weakness, right facial droop, and left gaze preference. 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  MRI Brain revealed left MCA and ACA stroke.  CTA Head and Neck showed occlusion of the left MCA and an age indeterminate occlusion of the proximal right internal carotid artery in the neck with non-opacification through the carotid terminus.  Echo w/EF 60 to 65 %, left atrium severely dilated.  LDL 120  hemoglobin a1c 6.0.  Heparin IV transitioned to eliquis 11/18/20  #PAF Was on elqiuis, on hold PTA due to abdominal hematoma Heparin IV transitioned to eliquis 11/18/20 Afib RVR - cardiology on board Converted to sinus 8/8 Now on po amiodarone  #Respiratory distress  - Was intubated due to respiratory distress - extubated to 4L on 11/14/20.  - still has mild respiratory distress -> on O2 mask, less labored - CXR 11/18/20 - partial lung collapse s/p CPAP -> d/c -> repeat CXR 11/19/20 with improvement - CXR 8/10 - Cardiomegaly and evidence of small bilateral pleural effusions with atelectasis, improved since  11/19/2020. - on lasix   - appreciate cardiology recs  #Hypertension Diltiazem 30 mg QD, Nebivolol 10 mg QD and Hydralazine 25 mg Q8 at home.  Currently blood pressure goal is normotensive.  Now on metoprolol 25mg  bid Add hydralazine 25mg  Q8h BP stable on the high end  #R ICA stenosis per Dr. , IR showed severe pre occlusive stenosis of the proximal right internal carotid artery associated with a delayed string sign found during cerebral angiogram, patient may require NIR intervention for the Rt ICA stenosis in the future  Follow up with Dr. as outpt   #Abd hematoma - reportedly developed after coughing 2 weeks prior to admission - Eliquis stopped in hospital in [place] - hematoma has greatly improved - tolerating IV heparin - switch back to eliquis 11/18/20  #Hyperlipidemia LDL goal is < 70.  LDL is 120 Allergic to statin meds Continue home zetia  Dysphagia  Currently NPO on NGT and TF @ 55 Speech following  Other stroke risk factors Advanced age Former smoker PAD  Other acute issue Leukocytosis - WBC 17.2->14.2->12.0->12.3->12.0->10.3   Hospital day # 9  This patient is critically ill due to respiratory distress, afib RVR, stroke, dysphagia and at significant risk of neurological worsening, death form respiratory failure, heart failure, recurrent stroke, sepsis, seizure. This patient's care requires constant  monitoring of vital signs, hemodynamics, respiratory and cardiac monitoring, review of multiple databases, neurological assessment, discussion with family, other specialists and medical decision making of high complexity. I spent 45 minutes of neurocritical care time in the care of this patient. I had long discussion with husbasnd at bedside, updated pt current condition, treatment plan and potential prognosis, and answered all the questions.  He expressed understanding and appreciation.   Marvel Plan, MD PhD Stroke Neurology 11/22/2020 12:33  PM   To contact Stroke Continuity provider, please refer to [email or web address]. After hours, contact General Neurology

## 2020-11-22 NOTE — Progress Notes (Addendum)
Progress Note  Patient Name: Beverly Li Date of Encounter: 11/22/2020  [facility] Cardiologist: Donato Schultz, MD   Subjective   Eyes open, tracks bilaterally  Inpatient Medications    Scheduled Meds:  [START ON 12/06/2020] amiodarone  200 mg Oral Daily   amiodarone  400 mg Per Tube BID   apixaban  5 mg Per Tube BID   arformoterol  15 mcg Nebulization BID   bethanechol  25 mg Per Tube TID   budesonide (PULMICORT) nebulizer solution  0.25 mg Nebulization BID   buPROPion  75 mg Per Tube BID   chlorhexidine  15 mL Mouth Rinse BID   Chlorhexidine Gluconate Cloth  6 each Topical Daily   ezetimibe  10 mg Per Tube Daily   feeding supplement (PROSource TF)  45 mL Per Tube BID   furosemide  40 mg Intravenous BID   insulin aspart  0-9 Units Subcutaneous Q4H   mouth rinse  15 mL Mouth Rinse q12n4p   metoprolol tartrate  25 mg Per Tube BID   pantoprazole sodium  40 mg Per Tube QHS   polyethylene glycol  17 g Per Tube Daily   revefenacin  175 mcg Nebulization Daily   Continuous Infusions:  feeding supplement (JEVITY 1.2 CAL) 55 mL/hr at 11/22/20 0700   PRN Meds: acetaminophen **OR** acetaminophen (TYLENOL) oral liquid 160 mg/5 mL **OR** acetaminophen, albuterol, diltiazem, hydrALAZINE, hydrALAZINE, ondansetron (ZOFRAN) IV   Vital Signs    Vitals:   11/22/20 0724 11/22/20 0800 11/22/20 0848 11/22/20 0853  BP:   (!) 148/73   Pulse:   60 62  Resp:   17 14  Temp:  97.9 F (36.6 C)    TempSrc:  Axillary    SpO2: (!) 88%  90% 93%  Weight:      Height:        Intake/Output Summary (Last 24 hours) at 11/22/2020 1013 Last data filed at 11/22/2020 0700 Gross per 24 hour  Intake 1484.75 ml  Output 3400 ml  Net -1915.25 ml   Last 3 Weights 11/22/2020 11/21/2020 11/20/2020  Weight (lbs) 113 lb 1.5 oz 112 lb 3.4 oz 112 lb 3.4 oz  Weight (kg) 51.3 kg 50.9 kg 50.9 kg      Telemetry    Sinus rhythm in th 60s - Personally Reviewed  ECG    No new tracings - Personally  Reviewed  Physical Exam   GEN: No acute distress - receiving breathing treatment Neck: No JVD Cardiac: RRR, no murmurs, rubs, or gallops.  Respiratory: exam difficult GI: Soft, nontender, non-distended  MS: No edema, both legs wrapped Neuro:  tracks with eyes, opens eyes to verbal stimuli   Labs    High Sensitivity Troponin:  No results for input(s): TROPONINIHS in the last 720 hours.    Chemistry Recent Labs  Lab 11/20/20 0249 11/21/20 0316 11/22/20 0341  NA 143 144 145  K 3.5 4.8 4.2  CL 100 104 99  CO2 34* 34* 38*  GLUCOSE 130* 158* 118*  BUN 39* 43* 39*  CREATININE 0.80 0.76 0.68  CALCIUM 9.0 9.0 8.8*  GFRNONAA >60 >60 >60  ANIONGAP 9 6 8      Hematology Recent Labs  Lab 11/20/20 0249 11/21/20 0316 11/22/20 0341  WBC 12.3* 12.0* 10.3  RBC 4.46 4.15 3.96  HGB 13.9 13.1 12.1  HCT 42.8 40.6 39.1  MCV 96.0 97.8 98.7  MCH 31.2 31.6 30.6  MCHC 32.5 32.3 30.9  RDW 18.1* 18.3* 17.8*  PLT 371 372  383    BNP Recent Labs  Lab 11/21/20 0316  BNP 1,286.5*     DDimer No results for input(s): DDIMER in the last 168 hours.   Radiology    DG Chest Port 1 View  Result Date: 11/22/2020 CLINICAL DATA:  75 year old female with shortness of breath. Status post code stroke with E LV 11/13/2020. EXAM: PORTABLE CHEST 1 VIEW COMPARISON:  Portable chest 11/21/2020 and earlier. FINDINGS: Portable AP semi upright view at 0938 hours. Stable cardiomegaly and mediastinal contours. Calcified aortic atherosclerosis. Enteric feeding tube courses to the abdomen as before, tip not included. Stable mild veiling opacity in both lower lungs. Pulmonary vascularity appears within normal limits. No pneumothorax. No air bronchograms. Hypo ventilation is greater on the left side. Lung base ventilation has mildly improved bilaterally since 11/19/2020. Paucity of bowel gas. IMPRESSION: Cardiomegaly and evidence of small bilateral pleural effusions with atelectasis, improved since 11/19/2020.  Electronically Signed   By: Odessa Fleming M.D.   On: 11/22/2020 10:06   DG Chest Port 1 View  Result Date: 11/21/2020 CLINICAL DATA:  Shortness of breath. EXAM: PORTABLE CHEST 1 VIEW COMPARISON:  November 19, 2020. FINDINGS: Stable cardiomegaly. Feeding tube is seen entering stomach. No pneumothorax is noted. Mild bibasilar subsegmental atelectasis is noted. Bony thorax is unremarkable. IMPRESSION: Mild bibasilar subsegmental atelectasis. Aortic Atherosclerosis (ICD10-I70.0). Electronically Signed   By: Lupita Raider M.D.   On: 11/21/2020 12:01    Cardiac Studies   Echo 11/14/20: 1. There is no evidence of systolic anterior motion of the mitral valve  or true LV outflow obstruction on this study. LVOT velocities are mildly  elevated at 2 m/s. Left ventricular ejection fraction, by estimation, is  60 to 65%. The left ventricle has  low normal function. The left ventricle has no regional wall motion  abnormalities. There is moderate concentric left ventricular hypertrophy.  Left ventricular diastolic parameters are consistent with Grade II  diastolic dysfunction (pseudonormalization).  Elevated left atrial pressure.   2. Right ventricular systolic function is normal. The right ventricular  size is normal.   3. Left atrial size was severely dilated.   4. Right atrial size was mildly dilated.   5. The mitral valve is degenerative. Mild to moderate mitral valve  regurgitation.   6. The aortic valve is tricuspid. There is moderate calcification of the  aortic valve. There is mild thickening of the aortic valve. Aortic valve  regurgitation is mild. Mild to moderate aortic valve  sclerosis/calcification is present, without any  evidence of aortic stenosis.   7. The inferior vena cava is dilated in size with <50% respiratory  variability, suggesting right atrial pressure of 15 mmHg.   Patient Profile     75 y.o. female with complex history of persistent atrial fib (OAC recently held due to abdominal  hematoma), HTN, HOCM, orthostatic hypotension, CAD with moderate LAD disease 2018, PVD s/p R fem-pop bpg 2016 who was admitted with stroke. Complex hx of afib as outlined - previously on amiodarone then Tikosyn, with recent discontinuation of Tikosyn due to persistent atrial fib as well as recent holding of Eliquis due to abdominal hematoma that occurred after coughing. (Had been tx for PNA shortly before.)   She was intubated and underwent successful thrombectomy with complete revasculatization, TICI 3 flow. MRI with L ACA/MCA territory infarcts and multiple small punctate infarcts. Slow improvement in neuro status. Cardiology consulted for afib during admission with difficulty controlling rates -> started on amiodarone.  Went back into NSR and  switch to PO amio   Respiratory distress 11/18/20 treated with lasix. RVR treated with IV amiodarone, converted back to sinus 0023 11/20/20.  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 - maintaining sinus rhythm on PO amiodarone - continue PO load - CVA occurred when eliquis was paused - will need lovenox bridge if need to hold OAC in the future - HR in the 60s - receiving metoprolol 25 mg BID - prior home tikosyn discontinued   Chronic anticoagulation - PNA-cough --> abdominal hematoma with subsequent eliquis hold - eliquis restarted after cleared by neuro - suspect she will need lovenox bridging in the future if OAC hold is needed   Chronic diastolic heart failure Mild to moderate MR Chronic respiratory distress - required intubation on arrival - diuresing on 40 mg IV lasix daily - 3.5 L urine output yesterday - continue daily dosing as needed - do not suspect she will need further significant diuresis   CAD  Hypertension Hyperlipidemia - on zetia - per neurology, BP goal is normotensive - sBP in the 140-150s - home bystolic and hydralazine - metoprolol 25 mg BID, hold  bystolic - consider restarting hydralazine   Cardiology follow up has been scheduled.     For questions or updates, please contact [facility] Please consult [email or web address] for contact info under        Signed, Marcelino Duster, PA  11/22/2020, 10:13 AM     Attending Note:   The patient was seen and examined.  Agree with assessment and plan as noted above.  Changes made to the above note as needed.  Patient seen and independently examined with Bettina Gavia, PA .   We discussed all aspects of the encounter. I agree with the assessment and plan as stated above.     Atrial fib:  is back in NSR.   On eliquis  2. CVA :   occurred after she had held eliquis for several days due to a large abdominal hematoma. Would suggest bridging with Lovenox whenever she needs to hold eliquis again   She was given 2 doses of IV lasix for hypoxemia.  She has been written for 1 more.  Will have the nurse give her that dose and re-evaluate her tomorrow .  She has diuresed quite a bit and has normal LV function   I have spent a total of 40 minutes with patient reviewing hospital  notes , telemetry, EKGs, labs and examining patient as well as establishing an assessment and plan that was discussed with the patient.  > 50% of time was spent in direct patient care.   Vesta Mixer, Montez Hageman., MD, [facility] 11/22/2020, 12:18 PM [address]  [address] Office [phone] Pager [phone]

## 2020-11-22 NOTE — Progress Notes (Addendum)
Physical Therapy Treatment Patient Details Name: Beverly Li MRN: [id number] DOB: 31-Aug-1945 Today's Date: 11/22/2020    History of Present Illness Pt is a 75 y.o. female who presented 11/13/20 with R sided weakness and L gaze preference. Outside window for tPA. 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 S/p thrombectomy with TICI 3 revascularization 8/1. ETT 8/1-8/2. 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 PMH: AFib on Eliquis (off x 1 week), COPD, remote tobacco abuse, hypertrophic cardiomyopathy, aortic stenosis, PAD, anxiety, arthritis, Bell's palsy, HTN, osteopenia, and GERD    PT Comments    Pt alert, visually tracking left to right consistently, and on venturi mask. Maintained oxygen saturations 100% throughout session. Utilized music [employer] Pitcher) for pt engagement and pt able to reach minimally and hold onto phone. Placed bed in chair position and worked on ROM exercises; progressed towards edge of bed, however pt noted to be wet due to urinary incontinence. Assisted back to supine for linen change. Will continue to progress mobility as tolerated.     Follow Up Recommendations  CIR;Supervision/Assistance - 24 hour     Equipment Recommendations  3in1 (PT);Hospital bed (hoyer lift)    Recommendations for Other Services       Precautions / Restrictions Precautions Precautions: Fall Precaution Comments: cortrak, R hemiplegia, Flexiseal, venturi mask Restrictions Weight Bearing Restrictions: No    Mobility  Bed Mobility Overal bed mobility: Needs Assistance Bed Mobility: Supine to Sit;Sit to Supine;Rolling Rolling: Total assist   Supine to sit: +2 for physical assistance;Total assist Sit to supine: +2 for physical assistance;Total assist   General bed mobility comments: no  initiation, total (A) to progress to eob.    Transfers                 General transfer comment: not appropriate at this time  Ambulation/Gait                 Stairs             Wheelchair Mobility    Modified Rankin (Stroke Patients Only) Modified Rankin (Stroke Patients Only) Pre-Morbid Rankin Score: No symptoms Modified Rankin: Severe disability     Balance Overall balance assessment: Needs assistance Sitting-balance support: Single extremity supported;Feet supported Sitting balance-Leahy Scale: Zero   Postural control: Right lateral lean                                  Cognition Arousal/Alertness: Awake/alert Behavior During Therapy: Flat affect Overall Cognitive Status: Difficult to assess Area of Impairment: Following commands                   Current Attention Level: Focused   Following Commands: Follows one step commands inconsistently (generalized response but tracking with name call)     Problem Solving: Slow processing General Comments: pt following less than 25% with global aphasia. pt with some following 1 step commands with multimodal cues. Pt tracking toward R visual field more. Able to hold onto phone when handed to her      Exercises General Exercises - Lower Extremity Ankle Circles/Pumps: PROM;10 reps;Supine;Both Long Arc Quad: PROM;Both;10 reps;Other (comment) (bed in chair position) Other Exercises Other Exercises: visual scanning past midline to the R multiple times during  session    General Comments        Pertinent Vitals/Pain Pain Assessment: Faces Faces Pain Scale: Hurts a little bit Pain Location: generalize discomfort Pain Descriptors / Indicators: Grimacing Pain Intervention(s): Monitored during session    Home Living                      Prior Function            PT Goals (current goals can now be found in the care plan section) Acute Rehab PT Goals Patient Stated  Goal: spouse like patient to go to CIR Potential to Achieve Goals: Fair Progress towards PT goals: Not progressing toward goals - comment    Frequency    Min 4X/week      PT Plan Current plan remains appropriate    Co-evaluation              AM-PAC PT "6 Clicks" Mobility   Outcome Measure  Help needed turning from your back to your side while in a flat bed without using bedrails?: Total Help needed moving from lying on your back to sitting on the side of a flat bed without using bedrails?: Total Help needed moving to and from a bed to a chair (including a wheelchair)?: Total Help needed standing up from a chair using your arms (e.g., wheelchair or bedside chair)?: Total Help needed to walk in hospital room?: Total Help needed climbing 3-5 steps with a railing? : Total 6 Click Score: 6    End of Session Equipment Utilized During Treatment: Oxygen Activity Tolerance: Patient tolerated treatment well Patient left: with call bell/phone within reach;in bed;with bed alarm set;with family/visitor present Nurse Communication: Mobility status PT Visit Diagnosis: Muscle weakness (generalized) (M62.81);Difficulty in walking, not elsewhere classified (R26.2);Other symptoms and signs involving the nervous system (R29.898);Hemiplegia and hemiparesis Hemiplegia - Right/Left: Right Hemiplegia - dominant/non-dominant: Dominant Hemiplegia - caused by: Cerebral infarction     Time: 1421-1444 PT Time Calculation (min) (ACUTE ONLY): 23 min  Charges:  $Therapeutic Activity: 23-37 mins                     Lillia Pauls, PT, DPT Acute Rehabilitation Services Pager [phone] Office [phone]    Norval Morton 11/22/2020, 3:19 PM

## 2020-11-23 DIAGNOSIS — Z4659 Encounter for fitting and adjustment of other gastrointestinal appliance and device: Secondary | ICD-10-CM | POA: Diagnosis not present

## 2020-11-23 DIAGNOSIS — I421 Obstructive hypertrophic cardiomyopathy: Secondary | ICD-10-CM | POA: Diagnosis not present

## 2020-11-23 DIAGNOSIS — R0603 Acute respiratory distress: Secondary | ICD-10-CM | POA: Diagnosis not present

## 2020-11-23 DIAGNOSIS — I480 Paroxysmal atrial fibrillation: Secondary | ICD-10-CM | POA: Diagnosis not present

## 2020-11-23 DIAGNOSIS — E43 Unspecified severe protein-calorie malnutrition: Secondary | ICD-10-CM | POA: Diagnosis not present

## 2020-11-23 DIAGNOSIS — I6602 Occlusion and stenosis of left middle cerebral artery: Secondary | ICD-10-CM | POA: Diagnosis not present

## 2020-11-23 DIAGNOSIS — L8990 Pressure ulcer of unspecified site, unspecified stage: Secondary | ICD-10-CM | POA: Insufficient documentation

## 2020-11-23 LAB — GLUCOSE, CAPILLARY
Glucose-Capillary: 117 mg/dL — ABNORMAL HIGH (ref 70–99)
Glucose-Capillary: 141 mg/dL — ABNORMAL HIGH (ref 70–99)
Glucose-Capillary: 143 mg/dL — ABNORMAL HIGH (ref 70–99)
Glucose-Capillary: 144 mg/dL — ABNORMAL HIGH (ref 70–99)
Glucose-Capillary: 147 mg/dL — ABNORMAL HIGH (ref 70–99)
Glucose-Capillary: 168 mg/dL — ABNORMAL HIGH (ref 70–99)

## 2020-11-23 LAB — BASIC METABOLIC PANEL
Anion gap: 8 (ref 5–15)
BUN: 44 mg/dL — ABNORMAL HIGH (ref 8–23)
CO2: 40 mmol/L — ABNORMAL HIGH (ref 22–32)
Calcium: 9 mg/dL (ref 8.9–10.3)
Chloride: 99 mmol/L (ref 98–111)
Creatinine, Ser: 0.73 mg/dL (ref 0.44–1.00)
GFR, Estimated: >60 mL/min (ref >60)
Glucose, Bld: 141 mg/dL — ABNORMAL HIGH (ref 70–99)
Potassium: 3.9 mmol/L (ref 3.5–5.1)
Sodium: 147 mmol/L — ABNORMAL HIGH (ref 135–145)

## 2020-11-23 LAB — CBC
HCT: 40.9 % (ref 36.0–46.0)
Hemoglobin: 12.3 g/dL (ref 12.0–15.0)
MCH: 30.4 pg (ref 26.0–34.0)
MCHC: 30.1 g/dL (ref 30.0–36.0)
MCV: 101.2 fL — ABNORMAL HIGH (ref 80.0–100.0)
Platelets: 412 10*3/uL — ABNORMAL HIGH (ref 150–400)
RBC: 4.04 MIL/uL (ref 3.87–5.11)
RDW: 17.7 % — ABNORMAL HIGH (ref 11.5–15.5)
WBC: 12.6 10*3/uL — ABNORMAL HIGH (ref 4.0–10.5)
nRBC: 0 % (ref 0.0–0.2)

## 2020-11-23 LAB — BRAIN NATRIURETIC PEPTIDE: B Natriuretic Peptide: 898.1 pg/mL — ABNORMAL HIGH (ref 0.0–100.0)

## 2020-11-23 MED ORDER — FUROSEMIDE 10 MG/ML IJ SOLN
40.0000 mg | Freq: Once | INTRAMUSCULAR | Status: AC
  Administered 2020-11-23: 40 mg via INTRAVENOUS
  Filled 2020-11-23: qty 4

## 2020-11-23 MED ORDER — FREE WATER
100.0000 mL | Freq: Three times a day (TID) | Status: DC
  Administered 2020-11-23 – 2020-11-28 (×16): 100 mL

## 2020-11-23 NOTE — Progress Notes (Signed)
ANTICOAGULATION CONSULT NOTE  Pharmacy Consult for heparin > heparin  Indication: atrial fibrillation  Patient Measurements: Height: 5\' 3"  (160 cm) Weight: 50.8 kg (111 lb 15.9 oz) IBW/kg (Calculated) : 52.4 Heparin Dosing Weight: 51 kg   Vital Signs: Temp: 98.4 F (36.9 C) (08/11 1200) Temp Source: Axillary (08/11 1200) BP: 109/64 (08/11 1300) Pulse Rate: 50 (08/11 1300)  Labs: Recent Labs    11/21/20 0316 11/22/20 0341 11/23/20 0412  HGB 13.1 12.1 12.3  HCT 40.6 39.1 40.9  PLT 372 383 412*  CREATININE 0.76 0.68 0.73     Estimated Creatinine Clearance: 48.7 mL/min (by C-G formula based on SCr of 0.73 mg/dL).   Assessment: 48 YOF with h/o Afib on Eliquis PTA who presented with an MCA occlusion and is s/p successful mechanical thrombectomy. Of note, patient's Eliquis was on hold ~ 1 week prior to admission due to recent hematoma. Currently back on apixaban.   H/H has been stable. Plt mildly elevated. No overt s/s of bleeding   Goal of Therapy:  Secondary stroke prevention Monitor platelets by anticoagulation protocol: Yes   Plan:  Continue apixaban 5 mg twice daily  Pharmacy to sign off   Thank you for involving pharmacy in this patient's care.  61, PharmD., BCPS, BCCCP Clinical Pharmacist Please refer to [facility] for unit-specific pharmacist

## 2020-11-23 NOTE — Progress Notes (Signed)
NAME:  Beverly Li, MRN:  [id number], DOB:  1946-03-05, LOS: 10 ADMISSION DATE:  11/13/2020, CONSULTATION DATE:  11/13/2020 REFERRING MD:  Derry Lory CHIEF COMPLAINT:  AMS   History of Present Illness:  75 year old woman with PMHx significant for Afib (on Eliquis, held x 1 week), AS, COPD, PAD, anxiety, GERD who presented to [facility] 8/1 with right-sided weakness, right facial droop and left gaze preference.    Of note, patient's AC recently held due to hospitalization for a large hematoma 2/2 significant coughing as a result of pneumonia. She was recently admitted to [facility] in [place]. [place] and while hospitalized went into Afib with RVR, kept for several days to manage. No medications were changed at discharge. She was advised to follow up with cardiology for AF management and when to restart anticoagulation.  CT Head on arrival to [facility] demonstrated signs of L MCA occlusion; patient was subsequently intubated for AMS and airway protection.  Taken to [facility] for thrombectomy with TICI 3 flow after 1 pass to L MCA M1 seg. Brought to ICU  on vent, Cleviprex and Propofol gtts.   PCCM consulted for assistance with management.  Significant Hospital Events: Including procedures, antibiotic start and stop dates in addition to other pertinent events   8/1 admitted, intubated, thrombectomy 8/2 Successful thrombectomy with complete revasculatization, TICI 3 flow. MRI with L ACA/MCA territory infarcts and multiple small punctate infarcts. Extubated late in day to [place]. 8/3 Afib on tele. Amio load + gtt started. Cortrak placement. 8/4 Slow improvement in neuro status, following more commands (LUE/LLE), tracking consistently, working with PT/OT/SLP. 8/5 patient converted to sinus rhythm, cardiology is following on amiodarone infusion 8/6 Patient remained in sinus bradycardia, cardiology recommended stopping beta-blocker and switching amiodarone to p.o 8/7 Started with acute urinary retention,  requiring straight cath twice. Afib with RVR. CXR with LLL collapse. Increased O2 requirement, could not tolerate BiPAP. Transitioned to [facility] 30L/30% FiO2. 8/8 Improving respiratory status, still volume up, working with therapies. 8/9 Weaned down to nasal cannula, tolerating well. Moving LUE/LLE purposefully. Following commands. Remains globally aphasic. Working with PT/OT. BNP 1200, additional diuresis today. CXR with atelectasis. 8/10 Desaturations overnight/early AM to 81%. 3L [place] -> HFNC -> Venturi mask. CXR with small bilateral pleural effusions and atelectasis, improved from prior. Suspect desats mainly positional. Lasix 40mg  IV x 1 given.  Interim History / Subjective:  Diuresis continue nts  Objective   Blood pressure 128/70, pulse (!) 54, temperature 98.3 F (36.8 C), temperature source Axillary, resp. rate 17, height 5\' 3"  (1.6 m), weight 50.8 kg, SpO2 97 %.    FiO2 (%):  [50 %] 50 %   Intake/Output Summary (Last 24 hours) at 11/23/2020 0901 Last data filed at 11/23/2020 0700 Gross per 24 hour  Intake 1375 ml  Output 1550 ml  Net -175 ml   Filed Weights   11/21/20 0600 11/22/20 0500 11/23/20 0500  Weight: 50.9 kg 51.3 kg 50.8 kg   Physical Examination: General:  wnwd female HEENT: MM pink/moist ?secretios poolig Neuro: rt hemiplegia, non verbal tracks CV: hsr PULM:  bibasilar crackles GI: soft, bsx4 active  01/22/21 urine Extremities: warm/dry,  neg edema  Skin: no rashes or lesions   Resolved Hospital Problem List:  Hypertensive emergency  Assessment & Plan:  Acute left MCA/ACA stroke s/p thrombectomy with TICI 3 Per stroke team CIR soon  Acute hypoxemic respiratory failure in setting of large left MCA/ACA stroke History of COPD, not in flare Left lung collapse due  to atelectasis Extubated 8/2. Variable respiratory status/O2 requirements since extubation.  Questionable component of aspiration advanced nurses to NTS to make sure is not tube feeding. O2 as  needed sats goal 88 to 90% Continue bronchodilators Serial cxr Continue diuresis     Hypertension, better controlled Currently well controlled on diltiazem per cardiology  Paroxysmal A. fib with rapid ventricular response Hypertrophic cardiomyopathy Recent hematoma of unclear etiology requiring AC cessation Patient went back into A. fib with RVR heart rate ranging between 100-120 8/7.  11/24/2019 sinus rhythm 56-60  Appreciate cardiology's input P.o. amiodarone Diltiazem Metroprolol Eliquis    Acute urinary retention .  Bladder scans as needed  Best Practice (right click and "Reselect all SmartList Selections" daily)   Diet/type: tubefeeds and NPO DVT prophylaxis: Eliquis GI prophylaxis: PPI Lines: N/A Foley:  N/A Code Status:  full code Last date of multidisciplinary goals of care discussion: 8/11, updated patient's husband and her son at bedside.  Continue full aggressive care  Critical care time: N/A  Brett Canales Areona Homer ACNP Acute Care Nurse Practitioner Adolph Pollack Pulmonary/Critical Care Please consult Amion 11/23/2020, 9:02 AM

## 2020-11-23 NOTE — Progress Notes (Signed)
Occupational Therapy Treatment Patient Details Name: Beverly Li MRN: [id number] DOB: [birth year] Today's Date: 11/23/2020    History of present illness 75 y.o. female who presented 11/13/20 with R sided weakness and L gaze preference. Outside window for tPA. 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 S/p thrombectomy with TICI 3 revascularization 8/1. ETT 8/1-8/2. 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 PMH: AFib on Eliquis (off x 1 week), COPD, remote tobacco abuse, hypertrophic cardiomyopathy, aortic stenosis, PAD, anxiety, arthritis, Bell's palsy, HTN, osteopenia, and GERD   OT comments  Pt continues to present with global aphasia, decreased functional use of RUE, L inattention with R head turn, and poor balance. Pt requiring Total A for bed mobility with sitting at EOB with Mod-Total A for sitting balance. Pt requiring Total A +2 for stand pivot to recliner. VSS on 4L. Continue to recommend dc to post-acute rehab and will continue to follow acutely as admitted.   Follow Up Recommendations  CIR    Equipment Recommendations  3 in 1 bedside commode;Wheelchair (measurements OT);Wheelchair cushion (measurements OT);Hospital bed    Recommendations for Other Services Rehab consult    Precautions / Restrictions Precautions Precautions: Fall Precaution Comments: cortrak, R hemiplegia       Mobility Bed Mobility Overal bed mobility: Needs Assistance Bed Mobility: Supine to Sit;Sit to Supine;Rolling     Supine to sit: +2 for physical assistance;Total assist     General bed mobility comments: Pt participating to bring LLE towards EOB and following command. Looses attention and brings leg back. Total A for elevating trunk.    Transfers                 General transfer comment: Noting  dysconjugate gaze. Unable to further assess at this time    Balance Overall balance assessment: Needs assistance Sitting-balance support: Single extremity supported;Feet supported Sitting balance-Leahy Scale: Zero Sitting balance - Comments: Pt able to achieve sitting with close Min [place] with bil elbows on knees; quickly fatigues and looses balance requiring Mod-Max to correct Postural control: Right lateral lean;Posterior lean   Standing balance-Leahy Scale: Zero Standing balance comment: Total                           ADL either performed or assessed with clinical judgement   ADL Overall ADL's : Needs assistance/impaired                         Toilet Transfer: +2 for physical assistance;Stand-pivot;Total assistance Toilet Transfer Details (indicate cue type and reason): Total A +2 for power up and then bringing hips to R.         Functional mobility during ADLs: Total assistance;+2 for physical assistance (stand pivot to recliner) General ADL Comments: Pt requiring Total A for stand pivot to recliner. Global aphasia and difficulty following commands     Vision       Perception     Praxis      Cognition Arousal/Alertness: Awake/alert Behavior During Therapy: Flat affect Overall Cognitive Status: Difficult to assess Area of Impairment: Following commands;Safety/judgement;Awareness;Problem solving                   Current Attention Level: Focused   Following Commands: Follows one step  commands inconsistently (generalized response but tracking with name call) Safety/Judgement: Decreased awareness of safety Awareness: Intellectual Problem Solving: Slow processing General Comments: Pt making eye contact (inconsistently) and able to focus attention to moving objects in the room. Pt requiring increased time and cues; inconsistently following commands. Tendency for head turn to L.        Exercises     Shoulder Instructions        General Comments VSS on 4L    Pertinent Vitals/ Pain       Pain Assessment: Faces Faces Pain Scale: Hurts a little bit Pain Location: generalize discomfort Pain Descriptors / Indicators: Grimacing Pain Intervention(s): Monitored during session;Limited activity within patient's tolerance;Repositioned  Home Living                                          Prior Functioning/Environment              Frequency  Min 2X/week        Progress Toward Goals  OT Goals(current goals can now be found in the care plan section)  Progress towards OT goals: Progressing toward goals  Acute Rehab OT Goals Patient Stated Goal: spouse like patient to go to CIR OT Goal Formulation: With patient/family Time For Goal Achievement: 11/29/20 Potential to Achieve Goals: Fair ADL Goals Pt Will Perform Grooming: sitting;with mod assist Additional ADL Goal #1: pt will follow 2 step command 50% of session Additional ADL Goal #2: pt will static sit eob for 5 minutes min (A) as precursor to adls  Plan Discharge plan remains appropriate    Co-evaluation    PT/OT/SLP Co-Evaluation/Treatment: Yes Reason for Co-Treatment: For patient/therapist safety;To address functional/ADL transfers   OT goals addressed during session: ADL's and self-care      AM-PAC OT "6 Clicks" Daily Activity     Outcome Measure   Help from another person eating meals?: Total Help from another person taking care of personal grooming?: A Lot Help from another person toileting, which includes using toliet, bedpan, or urinal?: Total Help from another person bathing (including washing, rinsing, drying)?: Total Help from another person to put on and taking off regular upper body clothing?: Total Help from another person to put on and taking off regular lower body clothing?: Total 6 Click Score: 7    End of Session Equipment Utilized During Treatment: Gait belt;Oxygen  OT Visit Diagnosis: Unsteadiness on  feet (R26.81);Muscle weakness (generalized) (M62.81);Hemiplegia and hemiparesis Hemiplegia - Right/Left: Right Hemiplegia - dominant/non-dominant: Dominant Hemiplegia - caused by: Cerebral infarction   Activity Tolerance Patient tolerated treatment well   Patient Left in chair;with call bell/phone within reach;with chair alarm set   Nurse Communication Mobility status;Need for lift equipment        Time: 1423-1446 OT Time Calculation (min): 23 min  Charges: OT General Charges $OT Visit: 1 Visit OT Treatments $Therapeutic Activity: 8-22 mins  Joani Cosma MSOT, OTR/L Acute Rehab Pager: [phone] Office: [phone]   Theodoro Grist Fynn Vanblarcom 11/23/2020, 3:18 PM

## 2020-11-23 NOTE — Progress Notes (Signed)
Physical Therapy Treatment Patient Details Name: Beverly Li MRN: [id number] DOB: 02/07/1946 Today's Date: 11/23/2020    History of Present Illness 75 y.o. female who presented 11/13/20 with R sided weakness and L gaze preference. Outside window for tPA. 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 S/p thrombectomy with TICI 3 revascularization 8/1. ETT 8/1-8/2. 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 PMH: AFib on Eliquis (off x 1 week), COPD, remote tobacco abuse, hypertrophic cardiomyopathy, aortic stenosis, PAD, anxiety, arthritis, Bell's palsy, HTN, osteopenia, and GERD    PT Comments    The pt was seen for continued progression of OOB mobility and balance training this afternoon. The pt was able to follow commands with multimodal cues for LE movements with her LLE, but continues to demo no active movement to command or spontaneously with RLE. The pt required totalA of 2 to manage bed mobility and sit-stand transfers at this time. The pt required totalA to RLE to maintain neutral position with upright mobility, totalA to manage pivoting movement with wither leg to complete transfer to recliner. The pt was able to make progress with seated balance at this time, and was able to attain moments of static sitting with minG, but continues to be limited by fatigue and weakness. Will continue to benefit from skilled PT acutely to progress functional balance and capacity for transfers. Continue to recommend CIR at d/c.    Follow Up Recommendations  CIR;Supervision/Assistance - 24 hour     Equipment Recommendations  3in1 (PT);Hospital bed (lift)    Recommendations for Other Services       Precautions / Restrictions Precautions Precautions: Fall Precaution Comments: cortrak, R  hemiplegia Restrictions Weight Bearing Restrictions: No    Mobility  Bed Mobility Overal bed mobility: Needs Assistance Bed Mobility: Supine to Sit;Sit to Supine;Rolling     Supine to sit: +2 for physical assistance;Total assist     General bed mobility comments: Pt participating to bring LLE towards EOB and following command. Looses attention and brings leg back. Total A for elevating trunk.    Transfers Overall transfer level: Needs assistance Equipment used: 2 person hand held assist Transfers: Sit to/from [employer] Sit to Stand: Total assist;+2 physical assistance;+2 safety/equipment Stand pivot transfers: Total assist;+2 physical assistance;+2 safety/equipment       General transfer comment: totalA to power up to standing with blocking of R knee to avoid buckling and hyperextension. totalA to achieve hip extension and maintain trunk upright. pt with no initiation of stepping, assist to manage pivoting to recliner  Ambulation/Gait             General Gait Details: Unable this date.    Modified Rankin (Stroke Patients Only) Modified Rankin (Stroke Patients Only) Pre-Morbid Rankin Score: No symptoms Modified Rankin: Severe disability     Balance Overall balance assessment: Needs assistance Sitting-balance support: Single extremity supported;Feet supported Sitting balance-Leahy Scale: Zero Sitting balance - Comments: Pt able to achieve sitting with close Min [place] with bil elbows on knees; quickly fatigues and looses balance requiring Mod-Max to correct Postural control: Right lateral lean;Posterior lean Standing balance support: Bilateral upper extremity supported;During functional activity Standing balance-Leahy Scale: Zero Standing balance comment: Total  Cognition Arousal/Alertness: Awake/alert Behavior During Therapy: Flat affect Overall Cognitive Status: Difficult to assess Area of Impairment:  Following commands;Safety/judgement;Awareness;Problem solving                   Current Attention Level: Focused   Following Commands: Follows one step commands inconsistently Safety/Judgement: Decreased awareness of safety Awareness: Intellectual Problem Solving: Slow processing General Comments: Pt making eye contact (inconsistently) and able to focus attention to moving objects in the room. Pt requiring increased time and cues; inconsistently following commands. Tendency for head turn to L.      Exercises General Exercises - Lower Extremity Ankle Circles/Pumps: PROM;10 reps;Supine;Both Heel Slides: PROM;Right;AAROM;Left;Supine    General Comments General comments (skin integrity, edema, etc.): VSS on 4L      Pertinent Vitals/Pain Pain Assessment: Faces Faces Pain Scale: Hurts a little bit Pain Location: generalize discomfort Pain Descriptors / Indicators: Grimacing Pain Intervention(s): Limited activity within patient's tolerance;Monitored during session;Repositioned     PT Goals (current goals can now be found in the care plan section) Acute Rehab PT Goals Patient Stated Goal: spouse like patient to go to CIR PT Goal Formulation: With family Time For Goal Achievement: 11/29/20 Potential to Achieve Goals: Fair Progress towards PT goals: Progressing toward goals    Frequency    Min 4X/week      PT Plan Current plan remains appropriate    Co-evaluation PT/OT/SLP Co-Evaluation/Treatment: Yes Reason for Co-Treatment: Complexity of the patient's impairments (multi-system involvement);Necessary to address cognition/behavior during functional activity;To address functional/ADL transfers;For patient/therapist safety PT goals addressed during session: Mobility/safety with mobility;Balance;Proper use of DME;Strengthening/ROM OT goals addressed during session: ADL's and self-care      AM-PAC PT "6 Clicks" Mobility   Outcome Measure  Help needed turning from  your back to your side while in a flat bed without using bedrails?: Total Help needed moving from lying on your back to sitting on the side of a flat bed without using bedrails?: Total Help needed moving to and from a bed to a chair (including a wheelchair)?: Total Help needed standing up from a chair using your arms (e.g., wheelchair or bedside chair)?: Total Help needed to walk in hospital room?: Total Help needed climbing 3-5 steps with a railing? : Total 6 Click Score: 6    End of Session Equipment Utilized During Treatment: Oxygen;Gait belt Activity Tolerance: Patient tolerated treatment well Patient left: with call bell/phone within reach;in bed;with bed alarm set;with family/visitor present Nurse Communication: Mobility status PT Visit Diagnosis: Muscle weakness (generalized) (M62.81);Difficulty in walking, not elsewhere classified (R26.2);Other symptoms and signs involving the nervous system (R29.898);Hemiplegia and hemiparesis Hemiplegia - Right/Left: Right Hemiplegia - dominant/non-dominant: Dominant Hemiplegia - caused by: Cerebral infarction     Time: 7253-6644 PT Time Calculation (min) (ACUTE ONLY): 23 min  Charges:  $Therapeutic Activity: 8-22 mins                     Lazarus Gowda, PT, DPT   Acute Rehabilitation Department Pager #: [phone]   Ronnie Derby 11/23/2020, 3:34 PM

## 2020-11-23 NOTE — TOC Progression Note (Signed)
Transition of Care [facility]) - Progression Note    Patient Details  Name: Beverly Li MRN: [id number] Date of Birth: Jul 26, 1945  [facility] CM/SW Contact  Mearl Latin, LCSW Phone Number: 11/23/2020, 5:09 PM  Clinical Narrative:    CSW spoke with patient's spouse at bedside and provided SNF Medicare ratings list. CSW explained referral will be able to be sent out once patient is more medically stable without cortrak. He stated he is looking for facilities such as [facility], [facility], [facility], and [facility]. CSW clarified a few other facilities on his list that are assisted living and not rehabs [facility] and [employer]). He is aware [facility] is not in network with [employer] and [facility] does not normally accept patients from the hospital but he is not concerned with costs. He asked CSW if patient would stay on [address] until his preferred SNF has a bed. CSW explained that once the physician declares the patient medically stable to discharge, patient would need to accept one of the available bed offers. Will continue to follow.      Barriers to Discharge: Continued Medical Work up  Expected Discharge Plan and Services   In-house Referral: Clinical Social Work   Post Acute Care Choice: Skilled Nursing Facility Living arrangements for the past 2 months: Single Family Home                                       Social Determinants of Health (SDOH) Interventions    Readmission Risk Interventions No flowsheet data found.

## 2020-11-23 NOTE — Progress Notes (Addendum)
Progress Note  Patient Name: Beverly Li Date of Encounter: 11/23/2020  [facility] Cardiologist: Donato Schultz, MD   Subjective   Unable to move right side, some commands with left hand, no restraints.  Inpatient Medications    Scheduled Meds:  [START ON 12/06/2020] amiodarone  200 mg Per Tube Daily   amiodarone  400 mg Per Tube BID   apixaban  5 mg Per Tube BID   arformoterol  15 mcg Nebulization BID   budesonide (PULMICORT) nebulizer solution  0.25 mg Nebulization BID   buPROPion  75 mg Per Tube BID   chlorhexidine  15 mL Mouth Rinse BID   Chlorhexidine Gluconate Cloth  6 each Topical Daily   ezetimibe  10 mg Per Tube Daily   feeding supplement (PROSource TF)  45 mL Per Tube BID   free water  100 mL Per Tube Q8H   hydrALAZINE  25 mg Per Tube Q8H   insulin aspart  0-9 Units Subcutaneous Q4H   mouth rinse  15 mL Mouth Rinse q12n4p   metoprolol tartrate  25 mg Per Tube BID   pantoprazole sodium  40 mg Per Tube QHS   polyethylene glycol  17 g Per Tube Daily   revefenacin  175 mcg Nebulization Daily   Continuous Infusions:  feeding supplement (JEVITY 1.2 CAL) 1,000 mL (11/23/20 1048)   PRN Meds: acetaminophen **OR** acetaminophen (TYLENOL) oral liquid 160 mg/5 mL **OR** acetaminophen, albuterol, diltiazem, hydrALAZINE, hydrALAZINE, ondansetron (ZOFRAN) IV   Vital Signs    Vitals:   11/23/20 0741 11/23/20 0800 11/23/20 0900 11/23/20 1000  BP:  128/70 134/72 137/68  Pulse:  (!) 54 (!) 55 (!) 56  Resp:  17 17 18   Temp:  98.3 F (36.8 C)    TempSrc:  Axillary    SpO2: 100% 97% 97% 99%  Weight:      Height:        Intake/Output Summary (Last 24 hours) at 11/23/2020 1105 Last data filed at 11/23/2020 1043 Gross per 24 hour  Intake 1265 ml  Output 950 ml  Net 315 ml   Last 3 Weights 11/23/2020 11/22/2020 11/21/2020  Weight (lbs) 111 lb 15.9 oz 113 lb 1.5 oz 112 lb 3.4 oz  Weight (kg) 50.8 kg 51.3 kg 50.9 kg      Telemetry    Sinus to sinus bradycardia in  the 50-60s, PVCs - Personally Reviewed  ECG    No new tracings - Personally Reviewed  Physical Exam   GEN: No acute distress.   Neck: No JVD Cardiac: RRR, 2-3/6 systolic murmur   Respiratory: respirations unlabored, yawning GI: Soft, nontender, non-distended  MS: No edema  Labs    High Sensitivity Troponin:  No results for input(s): TROPONINIHS in the last 720 hours.    Chemistry Recent Labs  Lab 11/21/20 0316 11/22/20 0341 11/23/20 0412  NA 144 145 147*  K 4.8 4.2 3.9  CL 104 99 99  CO2 34* 38* 40*  GLUCOSE 158* 118* 141*  BUN 43* 39* 44*  CREATININE 0.76 0.68 0.73  CALCIUM 9.0 8.8* 9.0  GFRNONAA >60 >60 >60  ANIONGAP 6 8 8      Hematology Recent Labs  Lab 11/21/20 0316 11/22/20 0341 11/23/20 0412  WBC 12.0* 10.3 12.6*  RBC 4.15 3.96 4.04  HGB 13.1 12.1 12.3  HCT 40.6 39.1 40.9  MCV 97.8 98.7 101.2*  MCH 31.6 30.6 30.4  MCHC 32.3 30.9 30.1  RDW 18.3* 17.8* 17.7*  PLT 372 383 412*  BNP Recent Labs  Lab 11/21/20 0316  BNP 1,286.5*     DDimer No results for input(s): DDIMER in the last 168 hours.   Radiology    DG Chest Port 1 View  Result Date: 11/22/2020 CLINICAL DATA:  75 year old female with shortness of breath. Status post code stroke with E LV 11/13/2020. EXAM: PORTABLE CHEST 1 VIEW COMPARISON:  Portable chest 11/21/2020 and earlier. FINDINGS: Portable AP semi upright view at 0938 hours. Stable cardiomegaly and mediastinal contours. Calcified aortic atherosclerosis. Enteric feeding tube courses to the abdomen as before, tip not included. Stable mild veiling opacity in both lower lungs. Pulmonary vascularity appears within normal limits. No pneumothorax. No air bronchograms. Hypo ventilation is greater on the left side. Lung base ventilation has mildly improved bilaterally since 11/19/2020. Paucity of bowel gas. IMPRESSION: Cardiomegaly and evidence of small bilateral pleural effusions with atelectasis, improved since 11/19/2020. Electronically  Signed   By: Odessa Fleming M.D.   On: 11/22/2020 10:06    Cardiac Studies   Echo 11/14/20: 1. There is no evidence of systolic anterior motion of the mitral valve  or true LV outflow obstruction on this study. LVOT velocities are mildly  elevated at 2 m/s. Left ventricular ejection fraction, by estimation, is  60 to 65%. The left ventricle has  low normal function. The left ventricle has no regional wall motion  abnormalities. There is moderate concentric left ventricular hypertrophy.  Left ventricular diastolic parameters are consistent with Grade II  diastolic dysfunction (pseudonormalization).  Elevated left atrial pressure.   2. Right ventricular systolic function is normal. The right ventricular  size is normal.   3. Left atrial size was severely dilated.   4. Right atrial size was mildly dilated.   5. The mitral valve is degenerative. Mild to moderate mitral valve  regurgitation.   6. The aortic valve is tricuspid. There is moderate calcification of the  aortic valve. There is mild thickening of the aortic valve. Aortic valve  regurgitation is mild. Mild to moderate aortic valve  sclerosis/calcification is present, without any  evidence of aortic stenosis.   7. The inferior vena cava is dilated in size with <50% respiratory  variability, suggesting right atrial pressure of 15 mmHg.   Patient Profile     75 y.o. female with complex history of persistent atrial fib (OAC recently held due to abdominal hematoma), HTN, HOCM, orthostatic hypotension, CAD with moderate LAD disease 2018, PVD s/p R fem-pop bpg 2016 who was admitted with stroke. Complex hx of afib as outlined - previously on amiodarone then Tikosyn, with recent discontinuation of Tikosyn due to persistent atrial fib as well as recent holding of Eliquis due to abdominal hematoma that occurred after coughing. (Had been tx for PNA shortly before.)   She was intubated and underwent successful thrombectomy with complete  revasculatization, TICI 3 flow. MRI with L ACA/MCA territory infarcts and multiple small punctate infarcts. Slow improvement in neuro status. Cardiology consulted for afib during admission with difficulty controlling rates -> started on amiodarone.  Went back into NSR and switch to PO amio   Respiratory distress 11/18/20 treated with lasix. RVR treated with IV amiodarone, converted back to sinus 0023 11/20/20.  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 Chronic anticoagulation - in the setting of holding eliquis due to abdominal hematoma from coughing - maintaining sinus rhythm on amiodarone - continue oral amiodarone load of 400 mg BID x 14 days then  200 mg daily - continue eliquis - suspect she will need lovenox bridging if OAC needs to be held in the future   Chronic diastolic heart failure Mild to moderate MR Chronic respiratory distress - required intubation to protect airway - PCCM following for desaturation overnight - has received IV lasix - appears euvolemic - renal function stable, BUN 44, Na 147 - CXR yesterday with improved atelectasis  - D/C IV lasix, can use PRN if needed   CAD Hypertension Hyperlipidemia  - on zetia - per neurology, BP goal is now normotensive - was on bystolic and hydralazine at home - no on metoprolol - hydralazine restarted yesterday for pressure support - SBP now in the 120-130s      For questions or updates, please contact [facility] Please consult [email or web address] for contact info under        Signed, Marcelino Duster, PA  11/23/2020, 11:05 AM    Attending Note:   The patient was seen and examined.  Agree with assessment and plan as noted above.  Changes made to the above note as needed.  Patient seen and independently examined with Bettina Gavia, PA .   We discussed all aspects of the encounter. I agree with the assessment and plan as stated above.     Atrial fib:  is  maintained NSR  2.  Hypertrophic CM:   cont current meds   3.  HTN: further management per primary team    I have spent a total of 40 minutes with patient reviewing hospital  notes , telemetry, EKGs, labs and examining patient as well as establishing an assessment and plan that was discussed with the patient.  > 50% of time was spent in direct patient care.  [facility] will sign off.   Medication Recommendations:  cont current plans  Other recommendations (labs, testing, etc):   Follow up as an outpatient:  follow up with Dr. Anne Fu.    Vesta Mixer, Montez Hageman., MD, [facility] 11/23/2020, 3:28 PM [address]  [address] Office [phone] Pager [phone]

## 2020-11-23 NOTE — Progress Notes (Signed)
  Speech Language Pathology Treatment: Cognitive-Linquistic;Dysphagia  Patient Details Name: Beverly Li MRN: [id number] DOB: 1946-04-10 Today's Date: 11/23/2020 Time: 7169-6789 SLP Time Calculation (min) (ACUTE ONLY): 24 min  Assessment / Plan / Recommendation Clinical Impression  Pt making slow strides towards goals. Made eye contact with therapist on right side spontaneously intermittently. Appropriate response to arrival of best friend, [place]. Initiated command following needing visual demonstration with left arm. Unable to initiate purposeful vocalization. Spouse says appears to be attempting to move lips at times to communicate.   Oral care followed by ice chip then /3 tsp sips thin. Oral apraxia affected labial seal, bolus cohesion and propulsion of bolus. Visible swallow x 3 of 5 attempts. Several minutes she had strong reflexive and continued coughs. Did not attempt applesauce as planned due to prolonged and effortful airway clearance. Continue to keep oral cavity moist/clean.     HPI HPI: 75 year old female with proximal A. 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 Intubated 8/1, extubated 8/2. MRI shows Acute infarcts involving the left basal ganglia, left insula, overlying  left frontal lobe and high left parasagittal frontal lobe cortex,  compatible with left ACA and MCA territory infarcts. Small areas of  acute infarct in the left frontal lobe white matter and punctate  left parietal cortical infarct, also left ACA and MCA territories.  Associated edema without mass effect. Additional punctate acute  infarcts in right frontal and parietal cortex. Small acute right  cerebellar infarct. Partial lung collapse 8/7 requiring temporary CPAP.      SLP Plan  Continue with current plan of care       Recommendations  Diet recommendations: NPO Medication Administration: Via alternative means                General recommendations: Rehab consult Oral Care  Recommendations: Oral care QID Follow up Recommendations: Inpatient Rehab SLP Visit Diagnosis: Dysphagia, unspecified (R13.10);Aphasia (R47.01);Apraxia (R48.2) Plan: Continue with current plan of care                       Royce Macadamia 11/23/2020, 9:38 AM  Breck Coons Lonell Face.Ed Nurse, children's [phone] Office [phone]

## 2020-11-23 NOTE — Progress Notes (Signed)
STROKE TEAM PROGRESS NOTE   Interval History   Patient husband and family friend are at the bedside. Pt neuro stable, unchanged. Her breathing seems more comfortable than yesterday, intermittent yawning, no significant respiratory distress.    Pertinent Lab Work and Imaging    11/13/20 CT Head WO IV Contrast 1. Mild asymmetric hypodensity of the left insula, concerning for acute left MCA territory infarct. An MRI could further evaluate. 2. Hyperdense appearance of the distal left M1 MCA, concerning for thrombus. Recommend CTA to further evaluate. 3. No acute hemorrhage. 4. Remote lacunar infarcts versus dilated perivascular spaces in bilateral inferior basal ganglia. Suspected remote lacunar infarct in the right cerebellum.  11/13/20 CT Angio Head and Neck W WO IV Contrast 1. Occlusion of the left M1 MCA with very little opacification of distal left MCA branches. Perfusion demonstrates the core infarct of 41 mL in the anterior left MCA territory with large (122 ml) of ischemic phenomena in the remaining left MCA and potentially left ACA territories. 2. Age indeterminate occlusion of the proximal right internal carotid artery in the neck with non-opacification through the carotid terminus. The right MCA and right posterior communicating artery are opacified via the of the right A1 ACA. 3. Suspected high-grade stenosis versus occlusion of the distal left A3/A4 ACA. 4. Approximately 40-50% stenosis of the proximal left ICA in the neck. 5. Severe stenosis of the non dominant/small right vertebral artery origin. Moderate stenosis of the intradural left vertebral artery. 6. Severe emphysema.  11/14/20 MRI Brain WO IV Contrast 1. 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 Associated edema without mass effect. 2. Additional scattered punctate acute infarcts in the right frontoparietal cortex and small acute infarct in the right cerebellum.  11/14/20 Echocardiogram Complete   1.  There is no evidence of systolic anterior motion of the mitral valve or true LV outflow obstruction on this study. LVOT velocities are mildly elevated at 2 m/s. Left ventricular ejection fraction, by estimation, is 60 to 65%. The left ventricle has low normal function. The left ventricle has no regional wall motion abnormalities. There is moderate concentric left ventricular hypertrophy. Left ventricular diastolic parameters are consistent with Grade II diastolic dysfunction (pseudonormalization). Elevated left atrial pressure.   2. Right ventricular systolic function is normal. The right ventricular size is normal.   3. Left atrial size was severely dilated.   4. Right atrial size was mildly dilated.   5. The mitral valve is degenerative. Mild to moderate mitral valve regurgitation.   6. The aortic valve is tricuspid. There is moderate calcification of the aortic valve. There is mild thickening of the aortic valve. Aortic valve regurgitation is mild. Mild to moderate aortic valve  sclerosis/calcification is present, without any evidence of aortic stenosis.   7. The inferior vena cava is dilated in size with <50% respiratory variability, suggesting right atrial pressure of 15 mmHg.   Physical Examination    Temp:  [98.1 F (36.7 C)-98.5 F (36.9 C)] 98.3 F (36.8 C) (08/11 0800) Pulse Rate:  [52-60] 56 (08/11 1000) Resp:  [11-21] 18 (08/11 1000) BP: (121-174)/(61-78) 137/68 (08/11 1000) SpO2:  [91 %-100 %] 99 % (08/11 1000) FiO2 (%):  [50 %] 50 % (08/10 2017) Weight:  [50.8 kg] 50.8 kg (08/11 0500)  General - Well nourished, well developed, on [place], no significant respiratory distress  Ophthalmologic - fundi not visualized due to noncooperation.  Cardiovascular - Regular rhythm and mild bradycardia, not in afib.  Neuro - awake, eyes  spontanousely open, tracking bilaterally, still has globa aphasia, nonverbal, not following commands, not able to name and repeat and read. Left gaze  preference but able to cross midline. Mildly blinking to visual threat on the left but not on the right. Not tracking, PERRL. Right mild facial droop. Tongue protrusion not cooperative. LUE able to hold with no drift. LLE can hold at knee flexion and foot on bed position. RUE and RLE slight withdraw to pain stimulation, bilateral babinski positive. Sensation, coordination and gait not tested.    Assessment and Plan   Beverly Li is a 75 y.o. female w/pmh of AFib on Eliquis (off x 1 week for abdominal hematoma), COPD, remote tobacco abuse, hypertrophic cardiomyopathy, aortic stenosis, PAD who presents with right sided weakness, right facial droop, and left gaze preference. 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  MRI Brain revealed left MCA and ACA stroke.  CTA Head and Neck showed occlusion of the left MCA and an age indeterminate occlusion of the proximal right internal carotid artery in the neck with non-opacification through the carotid terminus.  Echo w/EF 60 to 65 %, left atrium severely dilated.  LDL 120  hemoglobin a1c 6.0.  Heparin IV transitioned to eliquis 11/18/20  #PAF Was on elqiuis, on hold PTA due to abdominal hematoma Heparin IV transitioned to eliquis 11/18/20 Afib RVR - cardiology on board Converted to sinus 8/8 Now on po amiodarone  #Respiratory distress, improved - Was intubated due to respiratory distress - extubated to 4L on 11/14/20.  - CXR 11/18/20 - partial lung collapse s/p CPAP -> d/c -> repeat CXR 11/19/20 with improvement - CXR 8/10 - Cardiomegaly and evidence of small bilateral pleural effusions with atelectasis, improved since 11/19/2020. - off lasix today - appreciate cardiology recs  #Hypertension Diltiazem 30 mg QD, Nebivolol 10 mg QD and Hydralazine 25 mg Q8 at home.  Currently blood pressure goal is  normotensive.  Now on metoprolol 25mg  bid Add hydralazine 25mg  Q8h BP stable    #R ICA stenosis per Dr. , IR showed severe pre occlusive stenosis of the proximal right internal carotid artery associated with a delayed string sign found during cerebral angiogram, patient may require NIR intervention for the Rt ICA stenosis in the future  Follow up with Dr. as outpt   #Abd hematoma - reportedly developed after coughing 2 weeks prior to admission - Eliquis stopped in hospital in [place] - hematoma has greatly improved - tolerating IV heparin - switch back to eliquis 11/18/20  #Hyperlipidemia LDL goal is < 70.  LDL is 120 Allergic to statin meds Continue home zetia  Dysphagia  Currently NPO on NGT and TF @ 55 Speech following  Other stroke risk factors Advanced age Former smoker PAD  Other acute issue Leukocytosis - WBC 17.2->14.2->12.0->12.3->12.0->10.3->12.6   Hospital day # 10  This patient is critically ill due to respiratory distress, A. fib ER, stroke, dysphagia and at significant risk of neurological worsening, death form respiratory failure, heart failure, recurrent stroke, hemorrhagic conversion, sepsis, seizure. This patient's care requires constant monitoring of vital signs, hemodynamics, respiratory and cardiac monitoring, review of multiple databases, neurological assessment, discussion with family, other specialists and medical decision making of high complexity. I spent 35 minutes of neurocritical care time in the care of this patient. I had long discussion  with husband at bedside, updated pt current condition, treatment plan and potential prognosis, and answered all the questions.  He expressed understanding and appreciation.   Marvel Plan, MD PhD Stroke Neurology 11/23/2020 12:16 PM   To contact Stroke Continuity provider, please refer to [email or web address]. After hours, contact General Neurology

## 2020-11-24 ENCOUNTER — Inpatient Hospital Stay (HOSPITAL_COMMUNITY): Payer: Medicare Other

## 2020-11-24 DIAGNOSIS — E43 Unspecified severe protein-calorie malnutrition: Secondary | ICD-10-CM | POA: Diagnosis not present

## 2020-11-24 DIAGNOSIS — I6602 Occlusion and stenosis of left middle cerebral artery: Secondary | ICD-10-CM | POA: Diagnosis not present

## 2020-11-24 DIAGNOSIS — I6521 Occlusion and stenosis of right carotid artery: Secondary | ICD-10-CM

## 2020-11-24 DIAGNOSIS — R0603 Acute respiratory distress: Secondary | ICD-10-CM | POA: Diagnosis not present

## 2020-11-24 DIAGNOSIS — D72829 Elevated white blood cell count, unspecified: Secondary | ICD-10-CM

## 2020-11-24 DIAGNOSIS — I63512 Cerebral infarction due to unspecified occlusion or stenosis of left middle cerebral artery: Secondary | ICD-10-CM | POA: Diagnosis not present

## 2020-11-24 LAB — BASIC METABOLIC PANEL
Anion gap: 8 (ref 5–15)
BUN: 46 mg/dL — ABNORMAL HIGH (ref 8–23)
CO2: 40 mmol/L — ABNORMAL HIGH (ref 22–32)
Calcium: 9.2 mg/dL (ref 8.9–10.3)
Chloride: 98 mmol/L (ref 98–111)
Creatinine, Ser: 0.81 mg/dL (ref 0.44–1.00)
GFR, Estimated: >60 mL/min (ref >60)
Glucose, Bld: 124 mg/dL — ABNORMAL HIGH (ref 70–99)
Potassium: 3.7 mmol/L (ref 3.5–5.1)
Sodium: 146 mmol/L — ABNORMAL HIGH (ref 135–145)

## 2020-11-24 LAB — CBC WITH DIFFERENTIAL/PLATELET
Abs Immature Granulocytes: 0.2 10*3/uL — ABNORMAL HIGH (ref 0.00–0.07)
Basophils Absolute: 0 10*3/uL (ref 0.0–0.1)
Basophils Relative: 0 %
Eosinophils Absolute: 0 10*3/uL (ref 0.0–0.5)
Eosinophils Relative: 0 %
HCT: 44.8 % (ref 36.0–46.0)
Hemoglobin: 13.8 g/dL (ref 12.0–15.0)
Immature Granulocytes: 1 %
Lymphocytes Relative: 8 %
Lymphs Abs: 1.1 10*3/uL (ref 0.7–4.0)
MCH: 31.3 pg (ref 26.0–34.0)
MCHC: 30.8 g/dL (ref 30.0–36.0)
MCV: 101.6 fL — ABNORMAL HIGH (ref 80.0–100.0)
Monocytes Absolute: 0.8 10*3/uL (ref 0.1–1.0)
Monocytes Relative: 5 %
Neutro Abs: 12.9 10*3/uL — ABNORMAL HIGH (ref 1.7–7.7)
Neutrophils Relative %: 86 %
Platelets: 449 10*3/uL — ABNORMAL HIGH (ref 150–400)
RBC: 4.41 MIL/uL (ref 3.87–5.11)
RDW: 17.4 % — ABNORMAL HIGH (ref 11.5–15.5)
WBC: 15 10*3/uL — ABNORMAL HIGH (ref 4.0–10.5)
nRBC: 0 % (ref 0.0–0.2)

## 2020-11-24 LAB — GLUCOSE, CAPILLARY
Glucose-Capillary: 141 mg/dL — ABNORMAL HIGH (ref 70–99)
Glucose-Capillary: 129 mg/dL — ABNORMAL HIGH (ref 70–99)
Glucose-Capillary: 127 mg/dL — ABNORMAL HIGH (ref 70–99)
Glucose-Capillary: 126 mg/dL — ABNORMAL HIGH (ref 70–99)
Glucose-Capillary: 117 mg/dL — ABNORMAL HIGH (ref 70–99)
Glucose-Capillary: 120 mg/dL — ABNORMAL HIGH (ref 70–99)

## 2020-11-24 LAB — MAGNESIUM: Magnesium: 2.4 mg/dL (ref 1.7–2.4)

## 2020-11-24 MED ORDER — POTASSIUM CHLORIDE 20 MEQ PO PACK
40.0000 meq | PACK | Freq: Once | ORAL | Status: AC
  Administered 2020-11-24: 40 meq
  Filled 2020-11-24: qty 2

## 2020-11-24 MED ORDER — ALBUTEROL SULFATE (2.5 MG/3ML) 0.083% IN NEBU
3.0000 mL | INHALATION_SOLUTION | Freq: Four times a day (QID) | RESPIRATORY_TRACT | Status: DC
  Administered 2020-11-24 (×2): 3 mL via RESPIRATORY_TRACT
  Filled 2020-11-24 (×2): qty 3

## 2020-11-24 MED ORDER — BUDESONIDE 0.5 MG/2ML IN SUSP
0.5000 mg | Freq: Two times a day (BID) | RESPIRATORY_TRACT | Status: DC
  Administered 2020-11-24 – 2020-12-06 (×24): 0.5 mg via RESPIRATORY_TRACT
  Filled 2020-11-24 (×25): qty 2

## 2020-11-24 MED ORDER — GUAIFENESIN 100 MG/5ML PO SOLN
15.0000 mL | Freq: Four times a day (QID) | ORAL | Status: DC
  Administered 2020-11-24 – 2020-12-06 (×47): 300 mg
  Filled 2020-11-24 (×9): qty 15
  Filled 2020-11-24: qty 5
  Filled 2020-11-24 (×3): qty 15
  Filled 2020-11-24: qty 5
  Filled 2020-11-24 (×27): qty 15
  Filled 2020-11-24: qty 5
  Filled 2020-11-24 (×2): qty 15
  Filled 2020-11-24: qty 5
  Filled 2020-11-24 (×2): qty 15

## 2020-11-24 MED ORDER — ALBUTEROL SULFATE (2.5 MG/3ML) 0.083% IN NEBU
2.5000 mg | INHALATION_SOLUTION | Freq: Four times a day (QID) | RESPIRATORY_TRACT | Status: DC | PRN
  Administered 2020-12-01: 2.5 mg via RESPIRATORY_TRACT
  Filled 2020-11-24: qty 3

## 2020-11-24 MED ORDER — HYDRALAZINE HCL 25 MG PO TABS
25.0000 mg | ORAL_TABLET | Freq: Once | ORAL | Status: AC
  Administered 2020-11-24: 25 mg
  Filled 2020-11-24: qty 1

## 2020-11-24 MED ORDER — HYDRALAZINE HCL 50 MG PO TABS
50.0000 mg | ORAL_TABLET | Freq: Two times a day (BID) | ORAL | Status: DC
  Administered 2020-11-24 – 2020-12-06 (×23): 50 mg
  Filled 2020-11-24 (×24): qty 1

## 2020-11-24 NOTE — TOC Progression Note (Signed)
Transition of Care (TOC) - Progression Note    Patient Details  Name: Beverly [name] MRN: [id number] Date of Birth: 09/20/1945  Transition of Care (TOC) CM/SW Contact  Amerson, Julie M, RN Phone Number: 11/24/2020, 12:10pm Clinical Narrative:   Met with patient's daughter and spouse at bedside, per their request.  They have questions regarding process for skilled nursing facility bed search, and timing of discharge.  Explained SNF search process in detail to family; they understand that patient's information will be faxed out to area skilled nursing facilities, family will then be given bed offers and asked to select desired facility.  They are concerned that they may not be satisfied with SNF choices, and desire a different facility.  Patient's husband states that he will consider private paying for SNF if he feels there are no acceptable offers.  Patient's daughter is currently calling facilities, and doing research on facilities in the area.  Family made aware that we are uncertain of timing of discharge, as patient currently still being fed via [place], and will have to have a permanent means of feeding prior to discharge.  They understand that she will either have to be eating, or have a PEG tube placed prior to discharge to SNF.  Answered all of family's questions to the best of my ability.  TOC Case Manager and CSW will continue to follow to assist with disposition.        Barriers to Discharge: Continued Medical Work up  Expected Discharge Plan and Services   In-house Referral: Clinical Social Work   Post Acute Care Choice: Skilled Nursing Facility Living arrangements for the past 2 months: Single Family Home                                       Social Determinants of Health (SDOH) Interventions    Readmission Risk Interventions No flowsheet data found.  Julie W. Amerson, RN, BSN  Trauma/Neuro ICU Case Manager [phone]  

## 2020-11-24 NOTE — Progress Notes (Addendum)
NAME:  Beverly [name], MRN:  [id number], DOB:  1945/07/27, LOS: 11 ADMISSION DATE:  11/13/2020, CONSULTATION DATE:  11/13/2020 REFERRING MD:  Derry Lory CHIEF COMPLAINT:  AMS   History of Present Illness:  75 year old woman with PMHx significant for Afib (on Eliquis, held x 1 week), AS, COPD, PAD, anxiety, GERD who presented to [facility] 8/1 with right-sided weakness, right facial droop and left gaze preference.    Of note, patient's AC recently held due to hospitalization for a large hematoma 2/2 significant coughing as a result of pneumonia. She was recently admitted to [facility] in [place]. [place], [place] and while hospitalized went into Afib with RVR, kept for several days to manage. No medications were changed at discharge. She was advised to follow up with cardiology for AF management and when to restart anticoagulation.  CT Head on arrival to [facility] demonstrated signs of L MCA occlusion; patient was subsequently intubated for AMS and airway protection.  Taken to [facility] for thrombectomy with TICI 3 flow after 1 pass to L MCA M1 seg. Brought to ICU  on vent, Cleviprex and Propofol gtts.   PCCM consulted for assistance with management.  Significant Hospital Events: Including procedures, antibiotic start and stop dates in addition to other pertinent events   8/1 admitted, intubated, thrombectomy 8/2 Successful thrombectomy with complete revasculatization, TICI 3 flow. MRI with L ACA/MCA territory infarcts and multiple small punctate infarcts. Extubated late in day to [place]. 8/3 Afib on tele. Amio load + gtt started. Cortrak placement. 8/4 Slow improvement in neuro status, following more commands (LUE/LLE), tracking consistently, working with PT/OT/SLP. 8/5 patient converted to sinus rhythm, cardiology is following on amiodarone infusion 8/6 Patient remained in sinus bradycardia, cardiology recommended stopping beta-blocker and switching amiodarone to p.o 8/7 Started with acute urinary retention,  requiring straight cath twice. Afib with RVR. CXR with LLL collapse. Increased O2 requirement, could not tolerate BiPAP. Transitioned to [facility] 30L/30% FiO2. 8/8 Improving respiratory status, still volume up, working with therapies. 8/9 Weaned down to nasal cannula, tolerating well. Moving LUE/LLE purposefully. Following commands. Remains globally aphasic. Working with PT/OT. BNP 1200, additional diuresis today. CXR with atelectasis. 8/10 Desaturations overnight/early AM to 81%. 3L [place] -> HFNC -> Venturi mask. CXR with small bilateral pleural effusions and atelectasis, improved from prior. Suspect desats mainly positional. Lasix 40mg  IV x 1 given. 8/11 diuresis stopped 2/2 contraction alkalosis   Interim History / Subjective:  Remains afebrile Unmeasured urinary occurrences for inaccurate I/Os, overall wt down Down to HFNC 4L doing well however had a coughing spell per husband f/b desaturation episode to 84%, requiring back up to 8-12 salter HFNC  Objective   Blood pressure (!) 143/78, pulse 60, temperature 98.3 F (36.8 C), temperature source Oral, resp. rate 12, height 5\' 3"  (1.6 m), weight 50.2 kg, SpO2 94 %.        Intake/Output Summary (Last 24 hours) at 11/24/2020 0732 Last data filed at 11/24/2020 0700 Gross per 24 hour  Intake 1420 ml  Output 2000 ml  Net -580 ml   Filed Weights   11/22/20 0500 11/23/20 0500 11/24/20 0500  Weight: 51.3 kg 50.8 kg 50.2 kg   Physical Examination: General:  Elderly female sitting upright in bed in NAD HEENT: MM pink/moist, thick oral secretions, right nare cortrak Neuro: Awake, tracks, non verbal, unable to follow commands, LUE 4/5 / LLE 3/5, right hemiplegia  CV: remains in NSR, +murmur PULM:  mildly tachypneic, insp/ exp wheeze and diminished GI: soft, +bs, purwick  Extremities: warm/dry, no LE edema  Skin: no rashes   CXR increased consolidation/ atelectasis R base  Resolved Hospital Problem List:  Hypertensive emergency  Assessment &  Plan:  Acute left MCA/ACA stroke s/p thrombectomy with TICI 3 R ICA stenosis  - Per stroke team - Ongoing SLP, PT, OT - CIR soon - cont zetia, Wellbutrin  - f/u w/ NIR outpt for possible intervention  Acute hypoxemic respiratory failure in setting of large left MCA/ACA stroke, with ongoing atelectasis, suspected aspiration, and volume overload History of COPD, not in flare Left lung collapse due to atelectasis- resolved Dysphagia Extubated 8/2. Variable respiratory status/O2 requirements since extubation. P:  - Still requiring salter HFNC 4-12 L at times for sat goal 88-94%.  Still remains at risk for further respiratory decompensation/ intubation given poor oral secretion clearance. Discussed with Dr. Delton Coombes and Dr. Roda Shutters.  Will remain in ICU another day for close monitoring then may be able to tx out to PCU.  - Unable to pass NTS cath this am - Cont BD (yupelri, albuterol + prn, brovanna/ pulmicort), aggressive pulm hygiene as tolerated - adding mucinex, do not think she would tolerate CPT at this time but would be good for her - Holding further diuresis given ongoing contraction alkalosis  - Remains NPO with cortrak, ongoing SLP - Intermittent CXR  Hypertension, better controlled - hydralazine 25mg  q8-> increase to 50 mg BID, lopressor 25mg  BID - prn apresoline, diltiazem prn SBP > 160  Paroxysmal A. fib with rapid ventricular response Hypertrophic cardiomyopathy Recent hematoma of unclear etiology requiring AC cessation - remains in NSR - per cardiology - cont amiodarone 400 mg BID x 14, then to 200 mg daily on 8/24 - cont eliquis 5mg  BID - H/H stable, no signs of bleeding/ hematoma expansion - KCL 40 meq x 1 for goal K>4, add on mag to am labs, goal > 2  Acute urinary retention - doing well with purwick - continue to monitor for urinary retention/ prn bladder scan  Leukocytosis - slightly up today but still afebrile, continue to monitor off abx.  Low threshold to start abx  for aspiration if becomes febrile or consistent increasing O2 requirements  Best Practice (right click and "Reselect all SmartList Selections" daily)   Diet/type: tubefeeds and NPO DVT prophylaxis: Eliquis GI prophylaxis: PPI Lines: N/A Foley:  N/A Code Status:  full code Last date of multidisciplinary goals of care discussion: 8/12, updated patient's husband at bedside.  Continue full aggressive care  Critical care time: N/A    9/24, ACNP [facility] 11/24/2020, 7:55 AM  See Amion for pager If no response to pager, please call PCCM consult pager After 7:00 pm call Elink

## 2020-11-24 NOTE — Progress Notes (Signed)
Nutrition Follow-up  DOCUMENTATION CODES:   Severe malnutrition in context of chronic illness  INTERVENTION:   Tube feeding via Cortrak tube: Jevity 1.2 at 55 ml/h (1320 ml per day) Prosource TF 45 ml BID  Provides 1664 kcal, 95 gm protein, 1070 ml free water daily  100 ml free water every 8 hours  Total free water: 1370 ml   NUTRITION DIAGNOSIS:   Severe Malnutrition related to chronic illness (COPD) as evidenced by severe muscle depletion, severe fat depletion. Ongoing.   GOAL:   Patient will meet greater than or equal to 90% of their needs Met with TF.   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    Pt discussed during ICU rounds and with RN. SLP following. Pt continues to have episodes of desaturation.  Pt on HFNC which is currently at 8 L Pt would need PEG placement if considering SNF placement.    8/2 s/p thrombectomy with complete revascularization   8/3 cortrak placed; tip gastric   Medications reviewed and include: SSI, miralax Labs reviewed: Na 146 BNP: 1286 -> 898 CBG's: 117-168   Diet Order:   Diet Order             Diet NPO time specified  Diet effective now                   EDUCATION NEEDS:   Not appropriate for education at this time  Skin:  Skin Assessment: Skin Integrity Issues: Skin Integrity Issues:: Stage II Stage II: anus  Last BM:  8/10 type 7  Height:   Ht Readings from Last 1 Encounters:  11/13/20 5' 3" (1.6 m)    Weight:   Wt Readings from Last 1 Encounters:  11/24/20 50.2 kg    BMI:  Body mass index is 19.6 kg/m.  Estimated Nutritional Needs:   Kcal:  1600-1800  Protein:  85-100 grams  Fluid:  >1.6 L/day  Lockie Pares.,  RD, LDN, CNSC See AMiON for contact information

## 2020-11-24 NOTE — Progress Notes (Signed)
Beverly [name]   Interval History   Patient husband is at the bedside. Pt more awake alert, tracking bilaterally, eye contact, however, still has global aphasia and right hemiplegia. Per husband, pt had another episode of desaturation overnight and resolved after breathing treatment.    Pertinent Lab Work and Imaging    11/13/20 CT Head WO IV Contrast 1. Mild asymmetric hypodensity of the left insula, concerning for acute left MCA territory infarct. An MRI could further evaluate. 2. Hyperdense appearance of the distal left M1 MCA, concerning for thrombus. Recommend CTA to further evaluate. 3. No acute hemorrhage. 4. Remote lacunar infarcts versus dilated perivascular spaces in bilateral inferior basal ganglia. Suspected remote lacunar infarct in the right cerebellum.  11/13/20 CT Angio Head and Neck W WO IV Contrast 1. Occlusion of the left M1 MCA with very little opacification of distal left MCA branches. Perfusion demonstrates the core infarct of 41 mL in the anterior left MCA territory with large (122 ml) of ischemic phenomena in the remaining left MCA and potentially left ACA territories. 2. Age indeterminate occlusion of the proximal right internal carotid artery in the neck with non-opacification through the carotid terminus. The right MCA and right posterior communicating artery are opacified via the of the right A1 ACA. 3. Suspected high-grade stenosis versus occlusion of the distal left A3/A4 ACA. 4. Approximately 40-50% stenosis of the proximal left ICA in the neck. 5. Severe stenosis of the non dominant/small right vertebral artery origin. Moderate stenosis of the intradural left vertebral artery. 6. Severe emphysema.  11/14/20 MRI Brain WO IV Contrast 1. 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 Associated edema without mass effect. 2. Additional scattered punctate acute infarcts in the right frontoparietal cortex and small acute infarct in the right  cerebellum.  11/14/20 Echocardiogram Complete   1. There is no evidence of systolic anterior motion of the mitral valve or true LV outflow obstruction on this study. LVOT velocities are mildly elevated at 2 m/s. Left ventricular ejection fraction, by estimation, is 60 to 65%. The left ventricle has low normal function. The left ventricle has no regional wall motion abnormalities. There is moderate concentric left ventricular hypertrophy. Left ventricular diastolic parameters are consistent with Grade II diastolic dysfunction (pseudonormalization). Elevated left atrial pressure.   2. Right ventricular systolic function is normal. The right ventricular size is normal.   3. Left atrial size was severely dilated.   4. Right atrial size was mildly dilated.   5. The mitral valve is degenerative. Mild to moderate mitral valve regurgitation.   6. The aortic valve is tricuspid. There is moderate calcification of the aortic valve. There is mild thickening of the aortic valve. Aortic valve regurgitation is mild. Mild to moderate aortic valve  sclerosis/calcification is present, without any evidence of aortic stenosis.   7. The inferior vena cava is dilated in size with <50% respiratory variability, suggesting right atrial pressure of 15 mmHg.   Physical Examination    Temp:  [97.9 F (36.6 C)-98.4 F (36.9 C)] 97.9 F (36.6 C) (08/12 0800) Pulse Rate:  [49-60] 60 (08/12 0700) Resp:  [9-22] 12 (08/12 0700) BP: (109-182)/(57-84) 143/78 (08/12 0700) SpO2:  [84 %-99 %] 92 % (08/12 0813) Weight:  [50.2 kg] 50.2 kg (08/12 0500)  General - Well nourished, well developed, on [place], mild respiratory distress  Ophthalmologic - fundi not visualized due to noncooperation.  Cardiovascular - Regular rhythm and mild bradycardia, not in afib.  Neuro - awake, eyes  spontanousely open, tracking bilaterally, still has globa aphasia, nonverbal, not following commands, not able to name and repeat and read. Left gaze  preference but able to cross midline. Mildly blinking to visual threat on the left but not on the right. Right facial droop. Tongue protrusion not cooperative. LUE and LLE purposeful movement against gravity. LUE able to hold with no drift. LLE can hold at knee flexion and foot on bed position. RUE and RLE no movement to pain stimulation, bilateral babinski positive. Sensation, coordination and gait not tested.    Assessment and Plan   Beverly [name] is a 75 y.o. female w/pmh of AFib on Eliquis (off x 1 week for abdominal hematoma), COPD, remote tobacco abuse, hypertrophic cardiomyopathy, aortic stenosis, PAD who presents with right sided weakness, right facial droop, and left gaze preference. 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  MRI Brain revealed left MCA and ACA Beverly.  CTA Head and Neck showed occlusion of the left MCA and an age indeterminate occlusion of the proximal right internal carotid artery in the neck with non-opacification through the carotid terminus.  Echo w/EF 60 to 65 %, left atrium severely dilated.  LDL 120  hemoglobin a1c 6.0.  Heparin IV transitioned to eliquis 11/18/20  #PAF Was on elqiuis, on hold PTA due to abdominal hematoma Heparin IV transitioned to eliquis 11/18/20 Afib RVR - cardiology on board Converted to sinus 8/8 Now on po amiodarone  #Respiratory distress, improved - Was intubated due to respiratory distress - extubated to 4L on 11/14/20.  - wax and wane respiratory distress - overall getting better over time - CXR 11/18/20 - partial lung collapse s/p CPAP -> d/c -> repeat CXR 11/19/20 with improvement - CXR 8/10 - Cardiomegaly and evidence of small bilateral pleural effusions with atelectasis, improved since 11/19/2020. - off lasix today - appreciate cardiology recs  #Hypertension Diltiazem 30 mg QD,  Nebivolol 10 mg QD and Hydralazine 25 mg Q8 at home.  Currently blood pressure goal is normotensive.  Now on metoprolol 25mg  bid Add hydralazine 25mg  Q8h BP stable on the high end  #R ICA stenosis per Dr. , IR showed severe pre occlusive stenosis of the proximal right internal carotid artery associated with a delayed string sign found during cerebral angiogram, patient may require NIR intervention for the Rt ICA stenosis in the future  Follow up with Dr. as outpt Avoid low BP   #Abd hematoma - reportedly developed after coughing 2 weeks prior to admission - Eliquis stopped in hospital in [place] - hematoma has greatly improved - tolerating IV heparin - switch back to eliquis 11/18/20  #Hyperlipidemia LDL goal is < 70.  LDL is 120 Allergic to statin meds Continue home zetia  Dysphagia  Currently NPO on NGT and TF @ 55 Speech following  Other Beverly risk factors Advanced age Former smoker PAD  Other acute issue Leukocytosis - WBC 17.2->14.2->12.0->12.3->12.0->10.3->12.6->15.0   Hospital day # 11  This patient is critically ill due to respiratory distress, afib RVR, Beverly, aphasia, dysphagia and at significant risk of neurological worsening, death form heart failure, respiratory failure, recurrent Beverly, bleeding from [facility], seizure, sepsis. This patient's care requires constant monitoring of vital signs, hemodynamics, respiratory and cardiac monitoring, review of multiple databases, neurological assessment, discussion with family, other specialists and medical decision making  of high complexity. I spent 35 minutes of neurocritical care time in the care of this patient. I had long discussion with husband at bedside, updated pt current condition, treatment plan and potential prognosis, and answered all the questions.  He expressed understanding and appreciation.   Marvel Plan, MD PhD [facility] 11/24/2020 10:51 AM   To contact [facility] Continuity  provider, please refer to [email or web address]. After hours, contact General Neurology

## 2020-11-25 ENCOUNTER — Inpatient Hospital Stay (HOSPITAL_COMMUNITY): Payer: Medicare Other

## 2020-11-25 DIAGNOSIS — Z87891 Personal history of nicotine dependence: Secondary | ICD-10-CM

## 2020-11-25 DIAGNOSIS — I69351 Hemiplegia and hemiparesis following cerebral infarction affecting right dominant side: Secondary | ICD-10-CM

## 2020-11-25 DIAGNOSIS — I69391 Dysphagia following cerebral infarction: Secondary | ICD-10-CM

## 2020-11-25 DIAGNOSIS — R209 Unspecified disturbances of skin sensation: Secondary | ICD-10-CM

## 2020-11-25 DIAGNOSIS — I70402 Unspecified atherosclerosis of autologous vein bypass graft(s) of the extremities, left leg: Secondary | ICD-10-CM

## 2020-11-25 DIAGNOSIS — R1909 Other intra-abdominal and pelvic swelling, mass and lump: Secondary | ICD-10-CM

## 2020-11-25 DIAGNOSIS — I63512 Cerebral infarction due to unspecified occlusion or stenosis of left middle cerebral artery: Secondary | ICD-10-CM | POA: Diagnosis not present

## 2020-11-25 LAB — CBC
HCT: 41.7 % (ref 36.0–46.0)
Hemoglobin: 12.6 g/dL (ref 12.0–15.0)
MCH: 30.7 pg (ref 26.0–34.0)
MCHC: 30.2 g/dL (ref 30.0–36.0)
MCV: 101.5 fL — ABNORMAL HIGH (ref 80.0–100.0)
Platelets: 428 10*3/uL — ABNORMAL HIGH (ref 150–400)
RBC: 4.11 MIL/uL (ref 3.87–5.11)
RDW: 17.8 % — ABNORMAL HIGH (ref 11.5–15.5)
WBC: 16.7 10*3/uL — ABNORMAL HIGH (ref 4.0–10.5)
nRBC: 0.1 % (ref 0.0–0.2)

## 2020-11-25 LAB — GLUCOSE, CAPILLARY
Glucose-Capillary: 150 mg/dL — ABNORMAL HIGH (ref 70–99)
Glucose-Capillary: 111 mg/dL — ABNORMAL HIGH (ref 70–99)
Glucose-Capillary: 141 mg/dL — ABNORMAL HIGH (ref 70–99)
Glucose-Capillary: 129 mg/dL — ABNORMAL HIGH (ref 70–99)
Glucose-Capillary: 131 mg/dL — ABNORMAL HIGH (ref 70–99)

## 2020-11-25 LAB — BASIC METABOLIC PANEL
Anion gap: 7 (ref 5–15)
BUN: 50 mg/dL — ABNORMAL HIGH (ref 8–23)
CO2: 34 mmol/L — ABNORMAL HIGH (ref 22–32)
Calcium: 9 mg/dL (ref 8.9–10.3)
Chloride: 105 mmol/L (ref 98–111)
Creatinine, Ser: 0.8 mg/dL (ref 0.44–1.00)
GFR, Estimated: >60 mL/min (ref >60)
Glucose, Bld: 122 mg/dL — ABNORMAL HIGH (ref 70–99)
Potassium: 4.4 mmol/L (ref 3.5–5.1)
Sodium: 146 mmol/L — ABNORMAL HIGH (ref 135–145)

## 2020-11-25 MED ORDER — IOHEXOL 350 MG/ML SOLN
125.0000 mL | Freq: Once | INTRAVENOUS | Status: AC | PRN
  Administered 2020-11-25: 125 mL via INTRAVENOUS

## 2020-11-25 NOTE — Progress Notes (Signed)
Patient ID: Beverly Li, female   DOB: 11/30/1945, 75 y.o.   MRN: [id number] STROKE TEAM PROGRESS NOTE   Interval History    Patient husband is at the bedside. No acute events overnight. Family noted right leg is colder than left leg.  He was reading about possible clots when that happens on google. She is still not able to speak due to her stroke. Per nurse, she had to be put back on nasal [place]ula oxygen due to desaturation.   Pertinent Lab Work and Imaging    11/13/20 CT Head WO IV Contrast 1. Mild asymmetric hypodensity of the left insula, concerning for acute left MCA territory infarct. An MRI could further evaluate. 2. Hyperdense appearance of the distal left M1 MCA, concerning for thrombus. Recommend CTA to further evaluate. 3. No acute hemorrhage. 4. Remote lacunar infarcts versus dilated perivascular spaces in bilateral inferior basal ganglia. Suspected remote lacunar infarct in the right cerebellum.  11/13/20 CT Angio Head and Neck W WO IV Contrast 1. Occlusion of the left M1 MCA with very little opacification of distal left MCA branches. Perfusion demonstrates the core infarct of 41 mL in the anterior left MCA territory with large (122 ml) of ischemic phenomena in the remaining left MCA and potentially left ACA territories. 2. Age indeterminate occlusion of the proximal right internal carotid artery in the neck with non-opacification through the carotid terminus. The right MCA and right posterior communicating artery are opacified via the of the right A1 ACA. 3. Suspected high-grade stenosis versus occlusion of the distal left A3/A4 ACA. 4. Approximately 40-50% stenosis of the proximal left ICA in the neck. 5. Severe stenosis of the non dominant/small right vertebral artery origin. Moderate stenosis of the intradural left vertebral artery. 6. Severe emphysema.  11/14/20 MRI Brain WO IV Contrast 1. 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 Associated edema  without mass effect. 2. Additional scattered punctate acute infarcts in the right frontoparietal cortex and small acute infarct in the right cerebellum.  11/14/20 Echocardiogram Complete   1. There is no evidence of systolic anterior motion of the mitral valve or true LV outflow obstruction on this study. LVOT velocities are mildly elevated at 2 m/s. Left ventricular ejection fraction, by estimation, is 60 to 65%. The left ventricle has low normal function. The left ventricle has no regional wall motion abnormalities. There is moderate concentric left ventricular hypertrophy. Left ventricular diastolic parameters are consistent with Grade II diastolic dysfunction (pseudonormalization). Elevated left atrial pressure.   2. Right ventricular systolic function is normal. The right ventricular size is normal.   3. Left atrial size was severely dilated.   4. Right atrial size was mildly dilated.   5. The mitral valve is degenerative. Mild to moderate mitral valve regurgitation.   6. The aortic valve is tricuspid. There is moderate calcification of the aortic valve. There is mild thickening of the aortic valve. Aortic valve regurgitation is mild. Mild to moderate aortic valve  sclerosis/calcification is present, without any evidence of aortic stenosis.   7. The inferior vena cava is dilated in size with <50% respiratory variability, suggesting right atrial pressure of 15 mmHg.   Physical Examination    Temp:  [97.6 F (36.4 C)-98.9 F (37.2 C)] 98.1 F (36.7 C) (08/13 1200) Pulse Rate:  [52-222] 60 (08/13 1500) Resp:  [12-25] 19 (08/13 1500) BP: (118-199)/(60-95) 153/68 (08/13 1500) SpO2:  [92 %-100 %] 96 % (08/13 1500) Weight:  [48.3 kg] 48.3 kg (08/13 0400)  General - Well nourished, well developed, on [place] oxygen.  Ophthalmologic - fundi not visualized due to noncooperation.  Cardiovascular -  not in afib.  Ext: right leg cold to touch compared to left. Perfusion still present, dorsalis pedis  pulse is noted bilaterally.  No swelling or redness noted.  Neuro -  MS: awake, alert. CN 2-12: eyes spontanousely open, tracking bilaterally.   has globa aphasia, nonverbal, not following commands, not able to name and repeat and read. Left gaze preference but able to cross midline. Right facial droop.   Motor:  LUE and LLE purposeful movement against gravity. LUE able to hold with no drift. LLE can hold at knee flexion and foot on bed position. RUE and RLE no movement to pain stimulation, bilateral babinski positive.  Sensory: pain sensation in left arm/leg. Absent on right. coordination and gait unable to test due to aphasia.    Assessment and Plan   [name] is a 75 y.o. female w/pmh of AFib on Eliquis (off x 1 week for abdominal hematoma), COPD, remote tobacco abuse, hypertrophic cardiomyopathy, aortic stenosis, PAD who presents with right sided weakness, right facial droop, and left gaze preference. 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  MRI Brain revealed left MCA and ACA stroke.  CTA Head and Neck showed occlusion of the left MCA and an age indeterminate occlusion of the proximal right internal carotid artery in the neck with non-opacification through the carotid terminus.  Echo w/EF 60 to 65 %, left atrium severely dilated.  LDL 120  hemoglobin a1c 6.0.  Heparin IV transitioned to eliquis 11/18/20 Stable  # New right leg vascular stenosis: u/s ordered and shows no flow in right leg from groin down to ankle. Official read pending. Consulted vascular and will eval pt today.  Con't anticoagulation with eliquis.  #PAF Was on elqiuis, on hold PTA due to abdominal hematoma Heparin IV transitioned to eliquis 11/18/20 Afib RVR - cardiology on board Converted to sinus 8/8 Now on po amiodarone  #Respiratory distress, improved -  Was intubated due to respiratory distress - extubated to 4L on 11/14/20.  - wax and wane respiratory distress - overall getting better over time - CXR 11/18/20 - partial lung collapse s/p CPAP -> d/c -> repeat CXR 11/19/20 with improvement - CXR 8/10 - Cardiomegaly and evidence of small bilateral pleural effusions with atelectasis, improved since 11/19/2020.   #Hypertension Diltiazem 30 mg QD, Nebivolol 10 mg QD and Hydralazine 25 mg Q8 at home.  Currently blood pressure goal is normotensive.   metoprolol 25mg  bid hydralazine 25mg  Q8h BP stable on the high end  #R ICA stenosis per Dr. , IR showed severe pre occlusive stenosis of the proximal right internal carotid artery associated with a delayed string sign found during cerebral angiogram, patient may require NIR intervention for the Rt ICA stenosis in the future  Follow up with Dr. as outpt Avoid low BP   #Abd hematoma - reportedly developed after coughing 2 weeks prior to admission - Eliquis stopped in hospital in [place] - hematoma has greatly improved - tolerating IV heparin - switch back to eliquis 11/18/20  #Hyperlipidemia LDL goal is < 70.  LDL is 120 Allergic to statin meds Continue home zetia  Dysphagia  Currently NPO on NGT and TF @ 55 Speech  following  Other stroke risk factors Advanced age Former smoker PAD  Other acute issue Leukocytosis - WBC 17.2->14.2->12.0->12.3->12.0->10.3->12.6->15.0   Hospital day # 12  This patient is critically ill due to respiratory distress, afib RVR, stroke, aphasia, dysphagia and at significant risk of neurological worsening, death form heart failure, respiratory failure, recurrent stroke, bleeding from [facility], seizure, sepsis. This patient's care requires constant monitoring of vital signs, hemodynamics, respiratory and cardiac monitoring, review of multiple databases, neurological assessment, discussion with family, other specialists and medical decision making  of high complexity. I spent 40 minutes of neurocritical care time in the care of this patient. I had long discussion with husband at bedside, updated pt current condition, treatment plan and potential prognosis, and answered all the questions.    I am covering stroke this weekend and patient is new to me.  Leticia Penna 11/25/2020 3:33 PM   To contact Stroke Continuity provider, please refer to [email or web address]. After hours, contact General Neurology

## 2020-11-25 NOTE — Progress Notes (Signed)
VASCULAR LAB    Bilateral lower extremity arterial duplex has been performed.  See CV proc for preliminary results.   Careem Yasui, RVT 11/25/2020, 3:32 PM

## 2020-11-25 NOTE — Consult Note (Signed)
[facility] [place]  Patient name: Beverly Li MRN: [id number] DOB: 21-Jan-1946 Sex: female   REQUESTING PROVIDER:   Neurology   REASON FOR CONSULT:    Cold right leg  HISTORY OF PRESENT ILLNESS:   Beverly Li is a 75 y.o. female, who was admitted to the hospital on 11/13/2020 with a left brain stroke.  She underwent successful thrombectomy via a left femoral approach.  She continues to have right-sided weakness and dysphagia with cognitive delay.  She has a history of atrial fibrillation and has been on anticoagulation however this was discontinued because of a hematoma while she was in [place] prior to her stroke.  She complained that her right leg was cooler than the left which led to a arterial Doppler study which suggested femoral artery occlusion.  She is back on anticoagulation currently.  She has a history of a right femoral to below-knee popliteal artery bypass graft by Dr. Durwin Nora in the past.  PAST MEDICAL HISTORY    Past Medical History:  Diagnosis Date   Anxiety    Arthritis    "some in my lower back; probably elbows, knees" (11/18/2017)   Atrial fibrillation (HCC)    Bell's palsy    when pt. was 75 yrs old, when under stress the left side of face will droop.   Complication of anesthesia    "vascular OR 2016; BP bottomed out; couldn't get it regulated; ended up in ICU for DAYS" (11/18/2017)   GERD (gastroesophageal reflux disease)    History of kidney stones    Hypertension    Hypertrophic cardiomyopathy (HCC)    severe LV basilar hypertrophy witn no evidence of significant outflow tract obstruction, EF 65-70%, mild LAE, mild TR, grade 1a diastolic dysfunction 05/15/10 (Dr. Donato Schultz) (Atrial Septal Hypertrophy pattern)-- Intra-op TEE with dsignificant outflow tract obstruction - AI, MR & TR   Insomnia    Mild aortic sclerosis    Osteopenia    Peripheral vascular disease (HCC)    Syncope    , Vagal      FAMILY HISTORY   Family History  Problem Relation Age of Onset   Liver cancer Mother    Cancer Mother        Liver   Hypertension Mother    Lung cancer Father    Cancer Father        Lung   Breast cancer Sister    Cancer Sister        Breast    SOCIAL HISTORY:   Social History   Socioeconomic History   Marital status: Married    Spouse name: Not on file   Number of children: Not on file   Years of education: Not on file   Highest education level: Not on file  Occupational History   Not on file  Tobacco Use   Smoking status: Former    Packs/day: 1.00    Years: 50.00    Pack years: 50.00    Types: Cigarettes    Quit date: 12/15/2014    Years since quitting: 5.9   Smokeless tobacco: Never  Vaping Use   Vaping Use: Never used  Substance and Sexual Activity   Alcohol use: Yes    Comment: 11/18/2017 "might have a couple glasses of wine/month; if that"   Drug use: Never   Sexual activity: Not Currently  Other Topics Concern   Not on file  Social History Narrative   Not on file   Social Determinants of  Health   Financial Resource Strain: Not on file  Food Insecurity: Not on file  Transportation Needs: Not on file  Physical Activity: Not on file  Stress: Not on file  Social Connections: Not on file  Intimate Partner Violence: Not on file    ALLERGIES:    Allergies  Allergen Reactions   Amoxicillin Other (See Comments)    UTI Has patient had a PCN reaction causing immediate rash, facial/tongue/throat swelling, SOB or lightheadedness with hypotension: No Has patient had a PCN reaction causing severe rash involving mucus membranes or skin necrosis: No Has patient had a PCN reaction that required hospitalization: No Has patient had a PCN reaction occurring within the last 10 years: Yes--UTI ONLY If all of the above answers are "NO", then may proceed with Cephalosporin use.    Atenolol Cough   Crestor [Rosuvastatin Calcium] Other (See Comments)     Muscle aches   Pravastatin Other (See Comments)    Muscle aches   Sulfa Antibiotics Nausea Only   Codeine     hallucinations    CURRENT MEDICATIONS:    Current Facility-Administered Medications  Medication Dose Route Frequency Provider Last Rate Last Admin   acetaminophen (TYLENOL) tablet 650 mg  650 mg Oral Q4H PRN Kirby-Graham, Beather Arbour, NP       Or   acetaminophen (TYLENOL) 160 MG/5ML solution 650 mg  650 mg Per Tube Q4H PRN Leda Gauze, NP   650 mg at 11/23/20 1002   Or   acetaminophen (TYLENOL) suppository 650 mg  650 mg Rectal Q4H PRN Leda Gauze, NP   650 mg at 11/15/20 1128   albuterol (PROVENTIL) (2.5 MG/3ML) 0.083% nebulizer solution 2.5 mg  2.5 mg Nebulization Q6H PRN Norton Blizzard, NP       [START ON 12/06/2020] amiodarone (PACERONE) tablet 200 mg  200 mg Per Tube Daily Marvel Plan, MD       amiodarone (PACERONE) tablet 400 mg  400 mg Per Tube BID Marvel Plan, MD   400 mg at 11/25/20 0936   apixaban (ELIQUIS) tablet 5 mg  5 mg Per Tube BID Marvel Plan, MD   5 mg at 11/25/20 0936   arformoterol (BROVANA) nebulizer solution 15 mcg  15 mcg Nebulization BID Lorin Glass, MD   15 mcg at 11/25/20 0906   budesonide (PULMICORT) nebulizer solution 0.5 mg  0.5 mg Nebulization BID Selmer Dominion B, NP   0.5 mg at 11/25/20 0906   buPROPion (WELLBUTRIN) tablet 75 mg  75 mg Per Tube BID Micki Riley, MD   75 mg at 11/25/20 0941   chlorhexidine (PERIDEX) 0.12 % solution 15 mL  15 mL Mouth Rinse BID Cheri Fowler, MD   15 mL at 11/25/20 0830   Chlorhexidine Gluconate Cloth 2 % PADS 6 each  6 each Topical Daily Erick Blinks, MD   6 each at 11/24/20 2200   diltiazem (CARDIZEM) tablet 30 mg  30 mg Per Tube Daily PRN Georgeann Oppenheim, RPH       ezetimibe (ZETIA) tablet 10 mg  10 mg Per Tube Daily Micki Riley, MD   10 mg at 11/25/20 1638   feeding supplement (JEVITY 1.2 CAL) liquid 1,000 mL  1,000 mL Per Tube Continuous Lynnell Catalan, MD 55 mL/hr at 11/25/20  0927 1,000 mL at 11/25/20 0927   feeding supplement (PROSource TF) liquid 45 mL  45 mL Per Tube BID Cheri Fowler, MD   45 mL at 11/25/20 0936   free  water 100 mL  100 mL Per Tube Q8H Christian, Rylee, MD   100 mL at 11/25/20 1426   guaiFENesin (ROBITUSSIN) 100 MG/5ML solution 300 mg  15 mL Per Tube Q6H Selmer DominionSimpson, Paula B, NP   300 mg at 11/25/20 1630   hydrALAZINE (APRESOLINE) injection 20 mg  20 mg Intravenous Q6H PRN Merry LoftyHeard, Courtney S, NP   20 mg at 11/25/20 [id number]   hydrALAZINE (APRESOLINE) tablet 50 mg  50 mg Per Tube BID Selmer DominionSimpson, Paula B, NP   50 mg at 11/25/20 [id number]   insulin aspart (novoLOG) injection 0-9 Units  0-9 Units Subcutaneous Q4H Lorin GlassSmith, Daniel C, MD   1 Units at 11/25/20 1646   MEDLINE mouth rinse  15 mL Mouth Rinse q12n4p Cheri Fowlerhand, Sudham, MD   15 mL at 11/25/20 1637   metoprolol tartrate (LOPRESSOR) tablet 25 mg  25 mg Per Tube BID Barrett, Joline Salthonda G, PA-C   25 mg at 11/25/20 0936   ondansetron (ZOFRAN) injection 4 mg  4 mg Intravenous Q6H PRN Leda GauzeKirby-Graham, Karen J, NP       pantoprazole sodium (PROTONIX) 40 mg/20 mL oral suspension 40 mg  40 mg Per Tube QHS Cheri Fowlerhand, Sudham, MD   40 mg at 11/24/20 2145   polyethylene glycol (MIRALAX / GLYCOLAX) packet 17 g  17 g Per Tube Daily Cheri Fowlerhand, Sudham, MD   17 g at 11/24/20 1006   revefenacin (YUPELRI) nebulizer solution 175 mcg  175 mcg Nebulization Daily Erick BlinksKhaliqdina, Salman, MD   175 mcg at 11/25/20 0906    REVIEW OF SYSTEMS:   [X]  denotes positive finding, [ ]  denotes negative finding Cardiac  Comments:  Chest pain or chest pressure:    Shortness of breath upon exertion:    Short of breath when lying flat:    Irregular heart rhythm:        Vascular    Pain in calf, thigh, or hip brought on by ambulation:    Pain in feet at night that wakes you up from your sleep:     Blood clot in your veins:    Leg swelling:  x       Pulmonary    Oxygen at home:    Productive cough:     Wheezing:         Neurologic    Sudden weakness in arms or  legs:  x   Sudden numbness in arms or legs:  x   Sudden onset of difficulty speaking or slurred speech: x   Temporary loss of vision in one eye:     Problems with dizziness:         Gastrointestinal    Blood in stool:      Vomited blood:         Genitourinary    Burning when urinating:     Blood in urine:        Psychiatric    Major depression:         Hematologic    Bleeding problems:    Problems with blood clotting too easily:        Skin    Rashes or ulcers:        Constitutional    Fever or chills:     PHYSICAL EXAM:   Vitals:   11/25/20 1400 11/25/20 1430 11/25/20 1500 11/25/20 1600  BP:  (!) 156/70 (!) 153/68 (!) 163/71  Pulse: 61 60 60 60  Resp: 17 (!) 25 19 17   Temp:    98.3 F (36.8  C)  TempSrc:    Oral  SpO2: 98% 98% 96% 95%  Weight:      Height:        GENERAL: The patient is a well-nourished female, in no acute distress. The vital signs are documented above. CARDIAC: There is a regular rate and rhythm.  VASCULAR: The right leg is cooler to the touch than the left.  She has a palpable dorsalis pedis pulse which is triphasic by Doppler signal and a biphasic posterior tibial Doppler signal. PULMONARY: Nonlabored respirations ABDOMEN: Soft and non-tender with normal pitched bowel sounds.  MUSCULOSKELETAL: There are no major deformities or cyanosis. NEUROLOGIC: No focal weakness or paresthesias are detected. SKIN: There are no ulcers or rashes noted. PSYCHIATRIC: The patient has a normal affect.  STUDIES:   I have reviewed her ultrasound with the following findings:  Right: Patent fem-pop bypass graft. There appears to be soft thrombus  noted in the common femoral artery/endarterectomy site.   Left: There appears to be soft thrombus noted in the common femoral  artery. Near occlusion of the proximal femoral artery and occlusion of the  mid to distal femoral artery. The profunda artery appears to feed the  distal femoral artery.   ASSESSMENT and  PLAN   Cool right leg: The patient has a history of a right femoral-popliteal bypass graft.  By ultrasound today this is widely patent and she has a palpable pedal pulse.  The did appear to be soft thrombus in both groins.  It is unclear whether or not this is acute or chronic.  I have recommended getting a CT angiogram to determine if this is something that needs to be addressed.  Currently however I would try to avoid any kind of surgical intervention given her condition from her stroke.  I will follow-up after the CT scan has been performed.  I did discuss this with her husband who was at the bedside.   Charlena Cross, MD, FACS [facility] [phone] Pager [phone]

## 2020-11-25 NOTE — Progress Notes (Signed)
NAME:  Beverly Li, MRN:  [id number], DOB:  1946-02-22, LOS: 12 ADMISSION DATE:  11/13/2020, CONSULTATION DATE:  11/13/2020 REFERRING MD:  Derry Lory CHIEF COMPLAINT:  AMS   History of Present Illness:  75 year old woman with PMHx significant for Afib (on Eliquis, held x 1 week), AS, COPD, PAD, anxiety, GERD who presented to [facility] 8/1 with right-sided weakness, right facial droop and left gaze preference.    Of note, patient's AC recently held due to hospitalization for a large hematoma 2/2 significant coughing as a result of pneumonia. She was recently admitted to [facility] in [place]. [place], [place] and while hospitalized went into Afib with RVR, kept for several days to manage. No medications were changed at discharge. She was advised to follow up with cardiology for AF management and when to restart anticoagulation.  CT Head on arrival to [facility] demonstrated signs of L MCA occlusion; patient was subsequently intubated for AMS and airway protection.  Taken to [facility] for thrombectomy with TICI 3 flow after 1 pass to L MCA M1 seg. Brought to ICU  on vent, Cleviprex and Propofol gtts.   PCCM consulted for assistance with management.  Significant Hospital Events: Including procedures, antibiotic start and stop dates in addition to other pertinent events   8/1 admitted, intubated, thrombectomy 8/2 Successful thrombectomy with complete revasculatization, TICI 3 flow. MRI with L ACA/MCA territory infarcts and multiple small punctate infarcts. Extubated late in day to [place]. 8/3 Afib on tele. Amio load + gtt started. Cortrak placement. 8/4 Slow improvement in neuro status, following more commands (LUE/LLE), tracking consistently, working with PT/OT/SLP. 8/5 patient converted to sinus rhythm, cardiology is following on amiodarone infusion 8/6 Patient remained in sinus bradycardia, cardiology recommended stopping beta-blocker and switching amiodarone to p.o 8/7 Started with acute urinary retention,  requiring straight cath twice. Afib with RVR. CXR with LLL collapse. Increased O2 requirement, could not tolerate BiPAP. Transitioned to [facility] 30L/30% FiO2. 8/8 Improving respiratory status, still volume up, working with therapies. 8/9 Weaned down to nasal cannula, tolerating well. Moving LUE/LLE purposefully. Following commands. Remains globally aphasic. Working with PT/OT. BNP 1200, additional diuresis today. CXR with atelectasis. 8/10 Desaturations overnight/early AM to 81%. 3L  -> HFNC -> Venturi mask. CXR with small bilateral pleural effusions and atelectasis, improved from prior. Suspect desats mainly positional. Lasix 40mg  IV x 1 given. 8/11 diuresis stopped 2/2 contraction alkalosis   Interim History / Subjective:   Weaned to room air No episodes of coughing/choking reported last 24 hours, last occurred early 8/12 No real change in exam WBC 16.7  Objective   Blood pressure (!) 167/76, pulse (!) 208, temperature 98.8 F (37.1 C), temperature source Oral, resp. rate 12, height 5\' 3"  (1.6 m), weight 48.3 kg, SpO2 100 %.        Intake/Output Summary (Last 24 hours) at 11/25/2020 0843 Last data filed at 11/25/2020 0600 Gross per 24 hour  Intake 1565 ml  Output 850 ml  Net 715 ml   Filed Weights   11/23/20 0500 11/24/20 0500 11/25/20 0400  Weight: 50.8 kg 50.2 kg 48.3 kg   Physical Examination: General: Early woman, ill-appearing, comfortable in bed HEENT: Oropharynx slightly dry, NG tube in place, no stridor or upper airway noise, secretions Neuro: Awake, tracks, aphasic.  Unable to stick out her tongue.  She does appear to be able to swallow.  Moving left upper and left lower extremity, right hemiplegia CV: Distant, regular, 2/6 systolic murmur PULM: Comfortable respiratory pattern, no crackles or  wheezes GI: Nondistended, positive bowel sounds Extremities: No edema Skin: No rash  CXR 8/13 reviewed by me, persistent bibasilar infiltrates versus atelectasis, slightly better  aeration right base  Resolved Hospital Problem List:  Hypertensive emergency  Assessment & Plan:  Acute left MCA/ACA stroke s/p thrombectomy with TICI 3 R ICA stenosis  -Plans as per neurology -Ongoing SLP, PT, OT -We will need inpatient rehab -Remains on Zetia -Question will be a candidate for IR evaluation of her right ICA after stabilized from acute events  Acute hypoxemic respiratory failure in setting of large left MCA/ACA stroke, with ongoing atelectasis, suspected aspiration, and volume overload History of COPD, not in flare Left lung collapse due to atelectasis- resolved Dysphagia Extubated 8/2. Variable respiratory status/O2 requirements since extubation. P:  -No further episodes of choking, coughing.  No desaturations and currently on room air.  She is still at some risk for aspiration given her aphasia, dysphagia but I think she can move to progressive care for further monitoring 8/13 -Continue scheduled bronchodilators as ordered -Continue Mucinex, may have benefited from this -Holding off on further diuresis given her metabolic alkalosis, consider restart going forward depending on course -Working with SLP, currently n.p.o. -Follow intermittent chest x-ray for stability  Hypertension, better controlled -Continue hydralazine 50 mg twice daily, metoprolol 25 mg twice daily   Paroxysmal A. fib with rapid ventricular response Hypertrophic cardiomyopathy Recent hematoma of unclear etiology requiring AC cessation -In sinus rhythm, appreciate cardiology assistance -On enteral amiodarone with plans for tapering to 200 mg daily -Eliquis as ordered, tolerating without any evidence of hematoma expansion  Acute urinary retention -Pure wick in place -Follow for any evidence urinary retention  Leukocytosis -Without clear evidence for active infection.  If persists or if becomes febrile most likely source would be aspiration pneumonia as she is at high risk.  Continue to  follow clinically, follow chest x-ray.  Low threshold to treat if evidence of clinical worsening, progressive infiltrates on chest x-ray   Best Practice (right click and "Reselect all SmartList Selections" daily)   Diet/type: tubefeeds and NPO DVT prophylaxis: Eliquis GI prophylaxis: PPI Lines: N/A Foley:  N/A Code Status:  full code Last date of multidisciplinary goals of care discussion: 8/12, updated patient's husband at bedside on 8/13.  Continue full aggressive care  Okay to transition out of the ICU from PCCM perspective on 8/13.  Please call if we can assist further  Critical care time: N/A    Levy Pupa, MD, PhD 11/25/2020, 8:53 AM [facility] [phone] or if no answer before 7:00PM call [phone] For any issues after 7:00PM please call eLink [phone]

## 2020-11-25 NOTE — Progress Notes (Signed)
RT NOTE:  CPAP held for tonight. According to Dr. Jayme Cloud note 8/7 CPAP stopped due to concern for aspiration given mental status (awake but not consistently following commands, weak). Pt has no documented OSA history in chart. Pt tolerates [place] well at night.

## 2020-11-25 NOTE — Progress Notes (Signed)
Preliminary [place] result showing no flow right groin to foot, Dr Viviann Spare paged

## 2020-11-26 LAB — CBC
HCT: 44 % (ref 36.0–46.0)
Hemoglobin: 13.3 g/dL (ref 12.0–15.0)
MCH: 30.8 pg (ref 26.0–34.0)
MCHC: 30.2 g/dL (ref 30.0–36.0)
MCV: 101.9 fL — ABNORMAL HIGH (ref 80.0–100.0)
Platelets: 434 10*3/uL — ABNORMAL HIGH (ref 150–400)
RBC: 4.32 MIL/uL (ref 3.87–5.11)
RDW: 17.9 % — ABNORMAL HIGH (ref 11.5–15.5)
WBC: 17.4 10*3/uL — ABNORMAL HIGH (ref 4.0–10.5)
nRBC: 0 % (ref 0.0–0.2)

## 2020-11-26 LAB — GLUCOSE, CAPILLARY
Glucose-Capillary: 110 mg/dL — ABNORMAL HIGH (ref 70–99)
Glucose-Capillary: 125 mg/dL — ABNORMAL HIGH (ref 70–99)
Glucose-Capillary: 133 mg/dL — ABNORMAL HIGH (ref 70–99)
Glucose-Capillary: 135 mg/dL — ABNORMAL HIGH (ref 70–99)
Glucose-Capillary: 138 mg/dL — ABNORMAL HIGH (ref 70–99)
Glucose-Capillary: 150 mg/dL — ABNORMAL HIGH (ref 70–99)
Glucose-Capillary: 151 mg/dL — ABNORMAL HIGH (ref 70–99)

## 2020-11-26 LAB — BASIC METABOLIC PANEL
Anion gap: 10 (ref 5–15)
BUN: 52 mg/dL — ABNORMAL HIGH (ref 8–23)
CO2: 31 mmol/L (ref 22–32)
Calcium: 9.2 mg/dL (ref 8.9–10.3)
Chloride: 108 mmol/L (ref 98–111)
Creatinine, Ser: 0.82 mg/dL (ref 0.44–1.00)
GFR, Estimated: >60 mL/min (ref >60)
Glucose, Bld: 119 mg/dL — ABNORMAL HIGH (ref 70–99)
Potassium: 4.5 mmol/L (ref 3.5–5.1)
Sodium: 149 mmol/L — ABNORMAL HIGH (ref 135–145)

## 2020-11-26 NOTE — Progress Notes (Signed)
Patient ID: Beverly Li, female   DOB: 1945/11/19, 75 y.o.   MRN: [id number] Patient ID: Beverly Li, female   DOB: [birth year], 75 y.o.   MRN: [id number] STROKE TEAM PROGRESS NOTE   Interval History    Patient husband is at the bedside. No acute events overnight. Seen by vascular surgery yesterday, recommended CTA femoral artery. Did not recommend intervention. Her legs are warm today. Her husband and son are at the bedside.    Pertinent Lab Work and Imaging    11/13/20 CT Head WO IV Contrast 1. Mild asymmetric hypodensity of the left insula, concerning for acute left MCA territory infarct. An MRI could further evaluate. 2. Hyperdense appearance of the distal left M1 MCA, concerning for thrombus. Recommend CTA to further evaluate. 3. No acute hemorrhage. 4. Remote lacunar infarcts versus dilated perivascular spaces in bilateral inferior basal ganglia. Suspected remote lacunar infarct in the right cerebellum.  11/13/20 CT Angio Head and Neck W WO IV Contrast 1. Occlusion of the left M1 MCA with very little opacification of distal left MCA branches. Perfusion demonstrates the core infarct of 41 mL in the anterior left MCA territory with large (122 ml) of ischemic phenomena in the remaining left MCA and potentially left ACA territories. 2. Age indeterminate occlusion of the proximal right internal carotid artery in the neck with non-opacification through the carotid terminus. The right MCA and right posterior communicating artery are opacified via the of the right A1 ACA. 3. Suspected high-grade stenosis versus occlusion of the distal left A3/A4 ACA. 4. Approximately 40-50% stenosis of the proximal left ICA in the neck. 5. Severe stenosis of the non dominant/small right vertebral artery origin. Moderate stenosis of the intradural left vertebral artery. 6. Severe emphysema.  11/14/20 MRI Brain WO IV Contrast 1. 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 Associated edema  without mass effect. 2. Additional scattered punctate acute infarcts in the right frontoparietal cortex and small acute infarct in the right cerebellum.  11/14/20 Echocardiogram Complete   1. There is no evidence of systolic anterior motion of the mitral valve or true LV outflow obstruction on this study. LVOT velocities are mildly elevated at 2 m/s. Left ventricular ejection fraction, by estimation, is 60 to 65%. The left ventricle has low normal function. The left ventricle has no regional wall motion abnormalities. There is moderate concentric left ventricular hypertrophy. Left ventricular diastolic parameters are consistent with Grade II diastolic dysfunction (pseudonormalization). Elevated left atrial pressure.   2. Right ventricular systolic function is normal. The right ventricular size is normal.   3. Left atrial size was severely dilated.   4. Right atrial size was mildly dilated.   5. The mitral valve is degenerative. Mild to moderate mitral valve regurgitation.   6. The aortic valve is tricuspid. There is moderate calcification of the aortic valve. There is mild thickening of the aortic valve. Aortic valve regurgitation is mild. Mild to moderate aortic valve  sclerosis/calcification is present, without any evidence of aortic stenosis.   7. The inferior vena cava is dilated in size with <50% respiratory variability, suggesting right atrial pressure of 15 mmHg.   Physical Examination    Temp:  [97.1 F (36.2 C)-98.6 F (37 C)] 98.6 F (37 C) (08/14 1200) Pulse Rate:  [58-74] 63 (08/14 1600) Resp:  [13-28] 23 (08/14 1600) BP: (114-183)/(51-93) 137/74 (08/14 1600) SpO2:  [90 %-99 %] 98 % (08/14 1600)  General - Well nourished, well developed, on [place] oxygen.  Ophthalmologic -  fundi not visualized due to noncooperation.  Cardiovascular -  not in afib.  Ext: right leg cold to touch compared to left. Perfusion still present, dorsalis pedis pulse is noted bilaterally.  No swelling or  redness noted.  Neuro -  MS: awake, alert. CN 2-12: eyes spontanousely open, tracking bilaterally.   has globa aphasia, nonverbal, not following commands, not able to name and repeat and read. Left gaze preference but able to cross midline. Right facial droop.   Motor:  LUE and LLE purposeful movement against gravity. LUE able to hold with no drift. LLE can hold at knee flexion and foot on bed position. RUE and RLE no movement to pain stimulation, bilateral babinski positive.  Sensory: pain sensation in left arm/leg. Absent on right. coordination and gait unable to test due to aphasia.    Assessment and Plan   Beverly Li is a 75 y.o. female w/pmh of AFib on Eliquis (off x 1 week for abdominal hematoma), COPD, remote tobacco abuse, hypertrophic cardiomyopathy, aortic stenosis, PAD who presents with right sided weakness, right facial droop, and left gaze preference. 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  MRI Brain revealed left MCA and ACA stroke.  CTA Head and Neck showed occlusion of the left MCA and an age indeterminate occlusion of the proximal right internal carotid artery in the neck with non-opacification through the carotid terminus.  Echo w/EF 60 to 65 %, left atrium severely dilated.  LDL 120  hemoglobin a1c 6.0.  Heparin IV transitioned to eliquis 11/18/20 Stable  # New right leg vascular stenosis: CTA shows possible distal right femoropopliteal bypass occlusion. Seen by vascular surgery. No intervention recommended.  Clinically better today with warm legs and dorsalis pedis pulse present b/l.  Con't to monitor.  #PAF Was on elqiuis, on hold PTA due to abdominal hematoma Heparin IV transitioned to eliquis 11/18/20 Afib RVR - cardiology on board Converted to sinus 8/8 Now on po amiodarone  #Respiratory distress,  improved - Was intubated due to respiratory distress - extubated to 4L on 11/14/20.  - wax and wane respiratory distress - overall getting better over time - CXR 11/18/20 - partial lung collapse s/p CPAP -> d/c -> repeat CXR 11/19/20 with improvement - CXR 8/10 - Cardiomegaly and evidence of small bilateral pleural effusions with atelectasis, improved since 11/19/2020.   #Hypertension Diltiazem 30 mg QD, Nebivolol 10 mg QD and Hydralazine 25 mg Q8 at home.  Currently blood pressure goal is normotensive.   metoprolol 25mg  bid hydralazine 25mg  Q8h BP stable on the high end  #R ICA stenosis per Dr. , IR showed severe pre occlusive stenosis of the proximal right internal carotid artery associated with a delayed string sign found during cerebral angiogram, patient may require NIR intervention for the Rt ICA stenosis in the future  Follow up with Dr. as outpt Avoid low BP   #Abd hematoma - reportedly developed after coughing 2 weeks prior to admission - Eliquis stopped in hospital in [place] - hematoma has greatly improved - tolerating IV heparin - switch back to eliquis 11/18/20  #Hyperlipidemia LDL goal is < 70.  LDL is 120 Allergic to statin meds Continue home zetia  Dysphagia  Currently NPO on NGT and TF @ 55 Speech following  Other stroke risk factors Advanced age Former  smoker PAD  Other acute issue Leukocytosis - WBC 17.2->14.2->12.0->12.3->12.0->10.3->12.6->15.0   Hospital day # 13  This patient is critically ill due to Stroke at significant risk of neurological worsening, death form heart failure, respiratory failure, recurrent stroke, bleeding from [facility], seizure, sepsis. This patient's care requires constant monitoring of vital signs, hemodynamics, respiratory and cardiac monitoring, review of multiple databases, neurological assessment, discussion with family, other specialists and medical decision making of high complexity. I spent 35 minutes of  neurocritical care time in the care of this patient. I had long discussion with husband at bedside, updated pt current condition, treatment plan and potential prognosis, and answered all the questions.     Leticia Penna [facility], Stroke Team. 11/26/2020 4:10 PM   To contact Stroke Continuity provider, please refer to [email or web address]. After hours, contact General Neurology

## 2020-11-26 NOTE — Progress Notes (Signed)
Dr. Viviann Spare gave order for modified NIHSS to be done q4H.

## 2020-11-27 DIAGNOSIS — I63512 Cerebral infarction due to unspecified occlusion or stenosis of left middle cerebral artery: Secondary | ICD-10-CM | POA: Diagnosis not present

## 2020-11-27 LAB — BASIC METABOLIC PANEL
Anion gap: 6 (ref 5–15)
BUN: 59 mg/dL — ABNORMAL HIGH (ref 8–23)
CO2: 32 mmol/L (ref 22–32)
Calcium: 9.1 mg/dL (ref 8.9–10.3)
Chloride: 112 mmol/L — ABNORMAL HIGH (ref 98–111)
Creatinine, Ser: 0.88 mg/dL (ref 0.44–1.00)
GFR, Estimated: >60 mL/min (ref >60)
Glucose, Bld: 125 mg/dL — ABNORMAL HIGH (ref 70–99)
Potassium: 4.1 mmol/L (ref 3.5–5.1)
Sodium: 150 mmol/L — ABNORMAL HIGH (ref 135–145)

## 2020-11-27 LAB — CBC
HCT: 41.6 % (ref 36.0–46.0)
Hemoglobin: 13 g/dL (ref 12.0–15.0)
MCH: 31.2 pg (ref 26.0–34.0)
MCHC: 31.3 g/dL (ref 30.0–36.0)
MCV: 99.8 fL (ref 80.0–100.0)
Platelets: 419 10*3/uL — ABNORMAL HIGH (ref 150–400)
RBC: 4.17 MIL/uL (ref 3.87–5.11)
RDW: 18.2 % — ABNORMAL HIGH (ref 11.5–15.5)
WBC: 17.5 10*3/uL — ABNORMAL HIGH (ref 4.0–10.5)
nRBC: 0 % (ref 0.0–0.2)

## 2020-11-27 LAB — GLUCOSE, CAPILLARY
Glucose-Capillary: 131 mg/dL — ABNORMAL HIGH (ref 70–99)
Glucose-Capillary: 145 mg/dL — ABNORMAL HIGH (ref 70–99)
Glucose-Capillary: 128 mg/dL — ABNORMAL HIGH (ref 70–99)
Glucose-Capillary: 100 mg/dL — ABNORMAL HIGH (ref 70–99)
Glucose-Capillary: 146 mg/dL — ABNORMAL HIGH (ref 70–99)

## 2020-11-27 NOTE — Progress Notes (Signed)
VASCULAR SURGERY:  This patient's right femoropopliteal bypass graft is patent.  She has some ectasia of both common femoral arteries.  I will arrange follow-up in my office in 6 months.  Vascular surgery will be available as needed.  Cari Caraway, MD 7:28 AM

## 2020-11-27 NOTE — TOC Progression Note (Signed)
Transition of Care [facility]) - Progression Note    Patient Details  Name: ADRIELLA ESSEX MRN: [id number] Date of Birth: 30-Oct-1945  Transition of Care [facility]) CM/SW Contact  Mearl Latin, LCSW Phone Number: 11/27/2020, 4:20 PM  Clinical Narrative:    CSW and RNCM spoke with spouse at bedside per request and clarified that once SNF referral can be sent out, spouse will choose a facility from the list of offers that are available. Spouse requested CSW contact his daughter-in-law, Cayley Pester [phone]) to answer questions. CSW left her a voicemail.      Barriers to Discharge: Continued Medical Work up  Expected Discharge Plan and Services   In-house Referral: Clinical Social Work   Post Acute Care Choice: Skilled Nursing Facility Living arrangements for the past 2 months: Single Family Home                                       Social Determinants of Health (SDOH) Interventions    Readmission Risk Interventions No flowsheet data found.

## 2020-11-27 NOTE — Progress Notes (Signed)
CPAP being held tonight. Pt is resting comfortably on 4L [place] sat 98.

## 2020-11-27 NOTE — Progress Notes (Addendum)
Physical Therapy Treatment Patient Details Name: Beverly Li MRN: [id number] DOB: Feb 07, 1946 Today's Date: 11/27/2020    History of Present Illness 75 y.o. female who presented 11/13/20 with R sided weakness and L gaze preference. Outside window for tPA. 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 S/p thrombectomy with TICI 3 revascularization 8/1. ETT 8/1-8/2. 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 PMH: AFib on Eliquis (off x 1 week), COPD, remote tobacco abuse, hypertrophic cardiomyopathy, aortic stenosis, PAD, anxiety, arthritis, Bell's palsy, HTN, osteopenia, and GERD    PT Comments     Pt husband reports pt did not sleep last night. Pt received with rectal pouch leak; RN present to help clean pt up. Performed maxi sky lift transfer up to chair and positioned to promote upright and midline. SpO2 93% on 4L O2, BP 142/85. Pt will make eye contact with name call and was focusing attention on pictures in room today. Purposeful movements with left upper extremity. Will continue to progress mobility as tolerated.     Follow Up Recommendations  Supervision/Assistance - 24 hour;SNF     Equipment Recommendations  3in1 (PT);Hospital bed (lift)    Recommendations for Other Services       Precautions / Restrictions Precautions Precautions: Fall Precaution Comments: cortrak, R hemiplegia Restrictions Weight Bearing Restrictions: No    Mobility  Bed Mobility Overal bed mobility: Needs Assistance Bed Mobility: Rolling Rolling: Total assist         General bed mobility comments: TotalA for rolling to R/L multiple times for peri care and to place maxi sky lift    Transfers                 General transfer comment: Maxi sky lift to chair  Ambulation/Gait             General  Gait Details: Unable this date.   Stairs             Wheelchair Mobility    Modified Rankin (Stroke Patients Only) Modified Rankin (Stroke Patients Only) Pre-Morbid Rankin Score: No symptoms Modified Rankin: Severe disability     Balance                                            Cognition Arousal/Alertness: Awake/alert Behavior During Therapy: Flat affect Overall Cognitive Status: Difficult to assess Area of Impairment: Following commands;Safety/judgement;Awareness;Problem solving                   Current Attention Level: Focused   Following Commands: Follows one step commands inconsistently Safety/Judgement: Decreased awareness of safety Awareness: Intellectual Problem Solving: Slow processing General Comments: Pt makes eye contact with name call, focuses attention to pictures in room on left. Follows a few commands with gestural cues. Purposeful movement with LUE      Exercises General Exercises - Lower Extremity Ankle Circles/Pumps: PROM;10 reps;Supine;Both    General Comments        Pertinent Vitals/Pain Pain Assessment: Faces Faces Pain Scale: Hurts a little bit Pain Location: generalize discomfort Pain Descriptors / Indicators: Grimacing Pain Intervention(s): Monitored during session    Home Living  Prior Function            PT Goals (current goals can now be found in the care plan section) Acute Rehab PT Goals Patient Stated Goal: spouse would like pt to go to [facility] PT Goal Formulation: With family Time For Goal Achievement: 11/29/20 Potential to Achieve Goals: Fair Progress towards PT goals: Not progressing toward goals - comment    Frequency    Min 3X/week      PT Plan Discharge plan needs to be updated;Frequency needs to be updated    Co-evaluation              AM-PAC PT "6 Clicks" Mobility   Outcome Measure  Help needed turning from your back to your side  while in a flat bed without using bedrails?: Total Help needed moving from lying on your back to sitting on the side of a flat bed without using bedrails?: Total Help needed moving to and from a bed to a chair (including a wheelchair)?: Total Help needed standing up from a chair using your arms (e.g., wheelchair or bedside chair)?: Total Help needed to walk in hospital room?: Total Help needed climbing 3-5 steps with a railing? : Total 6 Click Score: 6    End of Session Equipment Utilized During Treatment: Oxygen Activity Tolerance: Patient limited by fatigue Patient left: with call bell/phone within reach;in bed;with bed alarm set;with family/visitor present Nurse Communication: Mobility status PT Visit Diagnosis: Muscle weakness (generalized) (M62.81);Difficulty in walking, not elsewhere classified (R26.2);Other symptoms and signs involving the nervous system (R29.898);Hemiplegia and hemiparesis Hemiplegia - Right/Left: Right Hemiplegia - dominant/non-dominant: Dominant Hemiplegia - caused by: Cerebral infarction     Time: 6387-5643 PT Time Calculation (min) (ACUTE ONLY): 29 min  Charges:  $Therapeutic Activity: 23-37 mins                     Lillia Pauls, PT, DPT Acute Rehabilitation Services Pager [phone] Office [phone]    Norval Morton 11/27/2020, 2:05 PM

## 2020-11-27 NOTE — Hospital Course (Addendum)
75 y.o. female w/pmh of AFib on Eliquis (off x 1 week for abdominal hematoma), COPD, remote tobacco abuse, hypertrophic cardiomyopathy, aortic stenosis, PAD  presented with right sided weakness, right facial droop, and left gaze preference. Underwent thrombectomy with TICI 3 revascularization.  She has been put back on Eliquis.  Pt has dense right-sided hemiparesis, aphasia.   Plan is for PEG tube for longer term nutrition.  TRH assumed care 8/16.

## 2020-11-27 NOTE — Progress Notes (Signed)
Patient ID: Beverly Li, female   DOB: [birth year], 75 y.o.   MRN: [id number] Patient ID: Beverly Li, female   DOB: Aug 25, 1945, 75 y.o.   MRN: [id number] STROKE TEAM PROGRESS NOTE   Interval History    Patient husband is at the bedside. No acute events overnight.  CT angiogram of the femoral artery is suspicious for, though not diagnostic of, occlusion of distal right femoropopliteal bypass.  Vascular surgery signed off.  Her legs are warm today with good pulses noted in both feet.[name] Her husband is at the bedside.  Patient neurological exam remains unchanged she is aphasic with dense right hemiplegia with purposeful movements on the left.  Pertinent Lab Work and Imaging    11/13/20 CT Head WO IV Contrast 1. Mild asymmetric hypodensity of the left insula, concerning for acute left MCA territory infarct. An MRI could further evaluate. 2. Hyperdense appearance of the distal left M1 MCA, concerning for thrombus. Recommend CTA to further evaluate. 3. No acute hemorrhage. 4. Remote lacunar infarcts versus dilated perivascular spaces in bilateral inferior basal ganglia. Suspected remote lacunar infarct in the right cerebellum.  11/13/20 CT Angio Head and Neck W WO IV Contrast 1. Occlusion of the left M1 MCA with very little opacification of distal left MCA branches. Perfusion demonstrates the core infarct of 41 mL in the anterior left MCA territory with large (122 ml) of ischemic phenomena in the remaining left MCA and potentially left ACA territories. 2. Age indeterminate occlusion of the proximal right internal carotid artery in the neck with non-opacification through the carotid terminus. The right MCA and right posterior communicating artery are opacified via the of the right A1 ACA. 3. Suspected high-grade stenosis versus occlusion of the distal left A3/A4 ACA. 4. Approximately 40-50% stenosis of the proximal left ICA in the neck. 5. Severe stenosis of the non dominant/small right vertebral  artery origin. Moderate stenosis of the intradural left vertebral artery. 6. Severe emphysema.  11/14/20 MRI Brain WO IV Contrast 1. 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 Associated edema without mass effect. 2. Additional scattered punctate acute infarcts in the right frontoparietal cortex and small acute infarct in the right cerebellum.  11/14/20 Echocardiogram Complete   1. There is no evidence of systolic anterior motion of the mitral valve or true LV outflow obstruction on this study. LVOT velocities are mildly elevated at 2 m/s. Left ventricular ejection fraction, by estimation, is 60 to 65%. The left ventricle has low normal function. The left ventricle has no regional wall motion abnormalities. There is moderate concentric left ventricular hypertrophy. Left ventricular diastolic parameters are consistent with Grade II diastolic dysfunction (pseudonormalization). Elevated left atrial pressure.   2. Right ventricular systolic function is normal. The right ventricular size is normal.   3. Left atrial size was severely dilated.   4. Right atrial size was mildly dilated.   5. The mitral valve is degenerative. Mild to moderate mitral valve regurgitation.   6. The aortic valve is tricuspid. There is moderate calcification of the aortic valve. There is mild thickening of the aortic valve. Aortic valve regurgitation is mild. Mild to moderate aortic valve  sclerosis/calcification is present, without any evidence of aortic stenosis.   7. The inferior vena cava is dilated in size with <50% respiratory variability, suggesting right atrial pressure of 15 mmHg.   Physical Examination    Temp:  [97.9 F (36.6 C)-98.9 F (37.2 C)] 98.1 F (36.7 C) (08/15 0800) Pulse Rate:  [54-67]  61 (08/15 1400) Resp:  [12-23] 17 (08/15 1400) BP: (115-183)/(55-85) 146/77 (08/15 1400) SpO2:  [93 %-99 %] 96 % (08/15 1400)  General - Well nourished, well developed, on [place]  oxygen.  Ophthalmologic - fundi not visualized due to noncooperation.  Cardiovascular -  not in afib.  Ext: right leg cold to touch compared to left. Perfusion still present, dorsalis pedis pulse is noted bilaterally.  No swelling or redness noted.  Neuro -  MS: awake, alert.  Globally aphasic and not following commands. CN 2-12: eyes spontanousely open, tracking bilaterally.   has globa aphasia, nonverbal, not following commands, not able to name and repeat and read. Left gaze preference but able to cross midline. Right facial droop.   Motor:  LUE and LLE purposeful movement against gravity. LUE able to hold with no drift. LLE can hold at knee flexion and foot on bed position. RUE and RLE no movement to pain stimulation, bilateral babinski positive.  Sensory: pain sensation in left arm/leg. Absent on right. coordination and gait unable to test due to aphasia.    Assessment and Plan   [name] is a 75 y.o. female w/pmh of AFib on Eliquis (off x 1 week for abdominal hematoma), COPD, remote tobacco abuse, hypertrophic cardiomyopathy, aortic stenosis, PAD who presents with right sided weakness, right facial droop, and left gaze preference. 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  MRI Brain revealed left MCA and ACA stroke.  CTA Head and Neck showed occlusion of the left MCA and an age indeterminate occlusion of the proximal right internal carotid artery in the neck with non-opacification through the carotid terminus.  Echo w/EF 60 to 65 %, left atrium severely dilated.  LDL 120  hemoglobin a1c 6.0.  Heparin IV transitioned to eliquis 11/18/20 Stable  # New right leg vascular stenosis: CTA shows possible distal right femoropopliteal bypass occlusion. Seen by vascular surgery. No intervention recommended.  Clinically better today with  warm legs and dorsalis pedis pulse present b/l.  Con't to monitor.  #PAF Was on elqiuis, on hold PTA due to abdominal hematoma Heparin IV transitioned to eliquis 11/18/20 Afib RVR - cardiology on board Converted to sinus 8/8 Now on po amiodarone  #Respiratory distress, improved - Was intubated due to respiratory distress - extubated to 4L on 11/14/20.  - wax and wane respiratory distress - overall getting better over time - CXR 11/18/20 - partial lung collapse s/p CPAP -> d/c -> repeat CXR 11/19/20 with improvement - CXR 8/10 - Cardiomegaly and evidence of small bilateral pleural effusions with atelectasis, improved since 11/19/2020.   #Hypertension Diltiazem 30 mg QD, Nebivolol 10 mg QD and Hydralazine 25 mg Q8 at home.  Currently blood pressure goal is normotensive.   metoprolol 25mg  bid hydralazine 25mg  Q8h BP stable on the high end  #R ICA stenosis per Dr. , IR showed severe pre occlusive stenosis of the proximal right internal carotid artery associated with a delayed string sign found during cerebral angiogram, patient may require NIR intervention for the Rt ICA stenosis in the future  Follow up with Dr. as outpt Avoid low BP   #Abd hematoma - reportedly developed after coughing 2 weeks prior to admission - Eliquis stopped in hospital in [place] - hematoma has greatly improved - tolerating IV heparin - switch  back to eliquis 11/18/20  #Hyperlipidemia LDL goal is < 70.  LDL is 120 Allergic to statin meds Continue home zetia  Dysphagia  Currently NPO on NGT and TF @ 55 Speech following  Other stroke risk factors Advanced age Former smoker PAD  Other acute issue Leukocytosis - WBC 17.2->14.2->12.0->12.3->12.0->10.3->12.6->15.0   Hospital day # 14 Patient continues to be globally aphasic with dense right hemiplegia and having dysphagia.  Fortunately she has good distal pulses in the right leg now despite the femoropopliteal bypass graft  suspicious for occlusion.  Continue mobilization out of bed.  Likely transfer out of ICU when bed available in the next few days.  We will asked medical hospitalist team to take role given her multiple medical problems.  Long discussion with patient husband at the bedside and Dr. Michae Kava and answered questions. This patient is critically ill due to Stroke at significant risk of neurological worsening, death form heart failure, respiratory failure, recurrent stroke, bleeding from [facility], seizure, sepsis. This patient's care requires constant monitoring of vital signs, hemodynamics, respiratory and cardiac monitoring, review of multiple databases, neurological assessment, discussion with family, other specialists and medical decision making of high complexity. I spent 32 minutes of neurocritical care time in the care of this patient. I had long discussion with husband at bedside, updated pt current condition, treatment plan and potential prognosis, and answered all the questions.     Delia Heady, MD Neurology, Stroke Team. 11/27/2020 2:24 PM   To contact Stroke Continuity provider, please refer to [email or web address]. After hours, contact General Neurology

## 2020-11-27 NOTE — Progress Notes (Signed)
  Speech Language Pathology Treatment: Cognitive-Linquistic;Dysphagia  Patient Details Name: Beverly Li MRN: [id number] DOB: [birth year] Today's Date: 11/27/2020 Time: 5784-6962 SLP Time Calculation (min) (ACUTE ONLY): 35 min  Assessment / Plan / Recommendation Clinical Impression  At this point pt will benefit from a longer term source of nutrition. Her ability to contain and manipulate ice chip or thick boluses impacted by CN VII weakness and reduced range of motion leading to holding and right sided aggregation of bolus. Therapist provided facilitative measures including verbal cues, dry spoon presentations and lingual pressure with spoon without oral transit or pharyngeal swallow initiated resulting in oral cavity suctioning.   There are mild improvements with wakefulness, attention and communication. During oral care her expressions relayed dislike, at one point moving head in opposite direction of [place] and vocalized "uh uh" which occurred x 2 during session. She initiated increased eye contact and was not able to vocalize/hum to familiar tune. Given hand over hand assist she held marker while therapist wrote her name. 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   Pt asking appropriate questions re: general rehab/procedure. Educated for strategies to facilitate communication and cognition which will be ongoing.      HPI HPI: 75 year old female with proximal A. 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 Intubated 8/1, extubated 8/2. MRI shows Acute infarcts involving the left basal ganglia, left insula, overlying  left frontal lobe and high left parasagittal frontal lobe cortex,  compatible with left ACA and MCA territory infarcts. Small areas of  acute infarct in the left frontal lobe white matter and punctate  left parietal cortical infarct, also left ACA and MCA territories.  Associated edema without mass effect.  Additional punctate acute  infarcts in right frontal and parietal cortex. Small acute right  cerebellar infarct. Partial lung collapse 8/7 requiring temporary CPAP.      SLP Plan  Continue with current plan of care       Recommendations  Diet recommendations: NPO Medication Administration: Via alternative means                Oral Care Recommendations: Oral care QID Follow up Recommendations: Inpatient Rehab SLP Visit Diagnosis: Dysphagia, unspecified (R13.10);Apraxia (R48.2);Aphasia (R47.01) Plan: Continue with current plan of care                      Royce Macadamia 11/27/2020, 3:09 PM Breck Coons Lonell Face.Ed Nurse, children's [phone] Office [phone]

## 2020-11-28 DIAGNOSIS — I6602 Occlusion and stenosis of left middle cerebral artery: Secondary | ICD-10-CM | POA: Diagnosis not present

## 2020-11-28 LAB — BASIC METABOLIC PANEL
Anion gap: 8 (ref 5–15)
BUN: 60 mg/dL — ABNORMAL HIGH (ref 8–23)
CO2: 27 mmol/L (ref 22–32)
Calcium: 8.9 mg/dL (ref 8.9–10.3)
Chloride: 115 mmol/L — ABNORMAL HIGH (ref 98–111)
Creatinine, Ser: 0.81 mg/dL (ref 0.44–1.00)
GFR, Estimated: >60 mL/min (ref >60)
Glucose, Bld: 95 mg/dL (ref 70–99)
Potassium: 4.3 mmol/L (ref 3.5–5.1)
Sodium: 150 mmol/L — ABNORMAL HIGH (ref 135–145)

## 2020-11-28 LAB — CBC
HCT: 41.9 % (ref 36.0–46.0)
Hemoglobin: 12.7 g/dL (ref 12.0–15.0)
MCH: 30.7 pg (ref 26.0–34.0)
MCHC: 30.3 g/dL (ref 30.0–36.0)
MCV: 101.2 fL — ABNORMAL HIGH (ref 80.0–100.0)
Platelets: 403 10*3/uL — ABNORMAL HIGH (ref 150–400)
RBC: 4.14 MIL/uL (ref 3.87–5.11)
RDW: 18.2 % — ABNORMAL HIGH (ref 11.5–15.5)
WBC: 16.9 10*3/uL — ABNORMAL HIGH (ref 4.0–10.5)
nRBC: 0 % (ref 0.0–0.2)

## 2020-11-28 LAB — GLUCOSE, CAPILLARY
Glucose-Capillary: 115 mg/dL — ABNORMAL HIGH (ref 70–99)
Glucose-Capillary: 136 mg/dL — ABNORMAL HIGH (ref 70–99)
Glucose-Capillary: 143 mg/dL — ABNORMAL HIGH (ref 70–99)
Glucose-Capillary: 148 mg/dL — ABNORMAL HIGH (ref 70–99)
Glucose-Capillary: 150 mg/dL — ABNORMAL HIGH (ref 70–99)
Glucose-Capillary: 95 mg/dL (ref 70–99)

## 2020-11-28 MED ORDER — HEPARIN (PORCINE) 25000 UT/250ML-% IV SOLN
950.0000 [IU]/h | INTRAVENOUS | Status: AC
  Administered 2020-11-28: 700 [IU]/h via INTRAVENOUS
  Administered 2020-11-30: 950 [IU]/h via INTRAVENOUS
  Filled 2020-11-28 (×2): qty 250

## 2020-11-28 MED ORDER — FREE WATER: 200.0000 mL | Freq: Three times a day (TID) | Status: DC

## 2020-11-28 MED ORDER — FREE WATER
200.0000 mL | Freq: Four times a day (QID) | Status: DC
  Administered 2020-11-28 – 2020-11-29 (×3): 200 mL

## 2020-11-28 MED ORDER — HEPARIN (PORCINE) 25000 UT/250ML-% IV SOLN
700.0000 [IU]/h | INTRAVENOUS | Status: DC
  Filled 2020-11-28: qty 250

## 2020-11-28 NOTE — Consult Note (Signed)
Consult Note  Beverly Li [birth year]  [id number].    Requesting MD: Dr. Delia Heady Chief Complaint/Reason for Consult: PEG placement  HPI:  Patient is a 75 year old female who presented to [facility] 11/13/20 as a code stroke after being off Eliquis for 5 days for a rectus sheath hematoma from coughing. Tikosyn had also been held. CTH without hemorrhage, but with mild asymmetric hypodensity of the left insula, concerning for acute left MCA territory infarct. Concern for thrombus of distal left M1 MCA. CTA head and neck showed abrupt occlusion of the left M1 MCA with very little opacification of distal MCA branches. IR was consulted for revascularization and were successful. Patient now stabilized and working with PT/OT/SLP but continues to have global aphasia and dysphagia. We are consulted for consideration of PEG placement.  PMH otherwise significant for peripheral vascular disease, A. Fib, HTN, hypertrophic cardiomyopathy, R ICA stenosis, GERD, Hx of Bell's Palsy at age 76 and anxiety. Patient also has a history of adverse reaction to anesthesia in 2016 during a vascular procedure - reportedly had trouble regulating BP intra and post-operatively. Past abdominal surgery includes bilateral inguinal hernia repair and ovarian cyst removal. Patient has been on Eliquis and LD was given this AM - transitioning to heparin gtt. Allergies listed to amoxicillin, sulfa drugs, atenolol, crestor, pravastatin and codeine.   ROS: Review of Systems  Unable to perform ROS: Patient nonverbal   Family History  Problem Relation Age of Onset   Liver cancer Mother    Cancer Mother        Liver   Hypertension Mother    Lung cancer Father    Cancer Father        Lung   Breast cancer Sister    Cancer Sister        Breast    Past Medical History:  Diagnosis Date   Anxiety    Arthritis    "some in my lower back; probably elbows, knees" (11/18/2017)   Atrial fibrillation (HCC)    Bell's palsy    when  pt. was 75 yrs old, when under stress the left side of face will droop.   Complication of anesthesia    "vascular OR 2016; BP bottomed out; couldn't get it regulated; ended up in ICU for DAYS" (11/18/2017)   GERD (gastroesophageal reflux disease)    History of kidney stones    Hypertension    Hypertrophic cardiomyopathy (HCC)    severe LV basilar hypertrophy witn no evidence of significant outflow tract obstruction, EF 65-70%, mild LAE, mild TR, grade 1a diastolic dysfunction 05/15/10 (Dr. Donato Schultz) (Atrial Septal Hypertrophy pattern)-- Intra-op TEE with dsignificant outflow tract obstruction - AI, MR & TR   Insomnia    Mild aortic sclerosis    Osteopenia    Peripheral vascular disease (HCC)    Syncope    , Vagal    Past Surgical History:  Procedure Laterality Date   AUGMENTATION MAMMAPLASTY Bilateral    BACK SURGERY     CARDIAC CATHETERIZATION N/A 05/07/2016   Procedure: Left Heart Cath and Coronary Angiography;  Surgeon: Kathleene Hazel, MD;  Location: [facility] INVASIVE CV LAB;  Service: Cardiovascular;  Laterality: N/A;   CARDIOVERSION N/A 09/24/2017   Procedure: CARDIOVERSION;  Surgeon: Laurey Morale, MD;  Location: [facility] ENDOSCOPY;  Service: Cardiovascular;  Laterality: N/A;   DILATION AND CURETTAGE OF UTERUS     ENDARTERECTOMY FEMORAL Right 03/02/2015   Procedure: ENDARTERECTOMY RIGHT FEMORAL;  Surgeon: Di Kindle  Edilia Bo, MD;  Location: [facility] OR;  Service: Vascular;  Laterality: Right;   FACIAL COSMETIC SURGERY Left 2002   "related to Bell's Palsy @ age 48; left eye/side of face droopy; tried to make area symmetrical"   FEMORAL-POPLITEAL BYPASS GRAFT Right 03/02/2015   Procedure: BYPASS GRAFT FEMORAL-BELOW KNEE POPLITEAL ARTERY;  Surgeon: Chuck Hint, MD;  Location: [facility] OR;  Service: Vascular;  Laterality: Right;   INGUINAL HERNIA REPAIR Bilateral 2002   IR ANGIO INTRA EXTRACRAN SEL COM CAROTID INNOMINATE UNI L MOD SED  11/16/2020   IR CT HEAD LTD  11/13/2020   IR  PERCUTANEOUS ART THROMBECTOMY/INFUSION INTRACRANIAL INC DIAG ANGIO  11/13/2020   OVARIAN CYST REMOVAL Left    PERIPHERAL VASCULAR CATHETERIZATION N/A 01/16/2015   Procedure: Abdominal Aortogram;  Surgeon: Chuck Hint, MD;  Location: [facility] INVASIVE CV LAB;  Service: Cardiovascular;  Laterality: N/A;   POSTERIOR LUMBAR FUSION  2015   "have plates and screws in there"   RADIOLOGY WITH ANESTHESIA N/A 11/13/2020   Procedure: IR WITH ANESTHESIA;  Surgeon: Radiologist, Medication, MD;  Location: [facility] OR;  Service: Radiology;  Laterality: N/A;   TONSILLECTOMY      Social History:  reports that she quit smoking about 5 years ago. Her smoking use included cigarettes. She has a 50.00 pack-year smoking history. She has never used smokeless tobacco. She reports current alcohol use. She reports that she does not use drugs.  Allergies:  Allergies  Allergen Reactions   Amoxicillin Other (See Comments)    UTI Has patient had a PCN reaction causing immediate rash, facial/tongue/throat swelling, SOB or lightheadedness with hypotension: No Has patient had a PCN reaction causing severe rash involving mucus membranes or skin necrosis: No Has patient had a PCN reaction that required hospitalization: No Has patient had a PCN reaction occurring within the last 10 years: Yes--UTI ONLY If all of the above answers are "NO", then may proceed with Cephalosporin use.    Atenolol Cough   Crestor [Rosuvastatin Calcium] Other (See Comments)    Muscle aches   Pravastatin Other (See Comments)    Muscle aches   Sulfa Antibiotics Nausea Only   Codeine     hallucinations    Medications Prior to Admission  Medication Sig Dispense Refill   buPROPion (WELLBUTRIN XL) 150 MG 24 hr tablet Take 150 mg by mouth every morning.      Calcium-Phosphorus-Vitamin D (CALCIUM/D3 ADULT GUMMIES PO) Take 2 tablets by mouth daily at 2 PM.     DEXILANT 30 MG capsule Take 30 mg by mouth daily.     diltiazem (CARDIZEM) 30 MG tablet Take 1  tablet (30 mg total) by mouth daily as needed (blood pressure higher than 160). 90 tablet 3   ezetimibe (ZETIA) 10 MG tablet TAKE 1 TABLET(10 MG) BY MOUTH DAILY (Patient taking differently: Take 10 mg by mouth daily.) 90 tablet 3   HYDROcodone-acetaminophen (NORCO/VICODIN) 5-325 MG tablet Take 1 tablet by mouth every 6 (six) hours as needed for pain. LF on 11-07-20 # 20     midodrine (PROAMATINE) 10 MG tablet TAKE 1 TABLET BY MOUTH THREE TIMES DAILY WITH MEALS AS NEEDED FOR BLOD PRESSURE LOWER THAN 100(SYSTOLIC) (Patient taking differently: Take 10 mg by mouth 3 (three) times daily as needed (if systolic BP is lower than 100).) 90 tablet 7   Multiple Vitamins-Iron (STRESS B COMPLEX/IRON) TABS Take 1 tablet by mouth at bedtime.      nebivolol (BYSTOLIC) 10 MG tablet Take 1 tablet (10 mg  total) by mouth daily. 90 tablet 3   potassium chloride (KLOR-CON) 10 MEQ tablet TAKE 1 TABLET(10 MEQ) BY MOUTH DAILY (Patient taking differently: Take 10 mEq by mouth daily.) 30 tablet 6   PROAIR HFA 108 (90 Base) MCG/ACT inhaler Inhale 2 puffs into the lungs 4 (four) times daily as needed (for wheezing/shortness of breath.).   1   TRELEGY ELLIPTA 100-62.5-25 MCG/INH AEPB Take 1 puff by mouth daily.     zolpidem (AMBIEN) 10 MG tablet Take 10 mg by mouth at bedtime as needed for sleep.     dofetilide (TIKOSYN) 250 MCG capsule Take 250 mcg by mouth 2 (two) times daily.     ELIQUIS 5 MG TABS tablet TAKE 1 TABLET(5 MG) BY MOUTH TWICE DAILY (Patient taking differently: Take 5 mg by mouth 2 (two) times daily.) 180 tablet 1   hydrALAZINE (APRESOLINE) 25 MG tablet TAKE 1 TABLET BY MOUTH EVERY 8 HOURS AS NEEDED FOR SYSTOLIC BLOOD PRESSURE GREATER THAN 160 (Patient taking differently: Take 25 mg by mouth every 8 (eight) hours as needed (for SBP is > than 160).) 90 tablet 8    Blood pressure (!) 147/73, pulse (!) 53, temperature 97.6 F (36.4 C), temperature source Axillary, resp. rate 16, height 5\' 3"  (1.6 m), weight 49.5 kg,  SpO2 98 %. Physical Exam:  General: pleasant, WD, thin female who is laying in bed in NAD HEENT: head is normocephalic, atraumatic.  Sclera are noninjected. Ears and nose without any masses or lesions.  Mouth is pink and moist, tongue protrudes slightly  Heart: regular, rate, and rhythm. Palpable radial and pedal pulses bilaterally Lungs: CTAB, no wheezes, rhonchi, or rales noted.  Respiratory effort nonlabored Abd: soft, ND, hematoma to L abd/flank MS: all 4 extremities are symmetrical with no cyanosis, clubbing, or edema. Skin: warm and dry with no masses, lesions, or rashes Neuro: unable to follow commands and makes no effort at verbalization  Psych: unable to assess   Results for orders placed or performed during the hospital encounter of 11/13/20 (from the past 48 hour(s))  Glucose, capillary     Status: Abnormal   Collection Time: 11/26/20  4:20 PM  Result Value Ref Range   Glucose-Capillary 150 (H) 70 - 99 mg/dL    Comment: Glucose reference range applies only to samples taken after fasting for at least 8 hours.  Glucose, capillary     Status: Abnormal   Collection Time: 11/26/20  8:40 PM  Result Value Ref Range   Glucose-Capillary 133 (H) 70 - 99 mg/dL    Comment: Glucose reference range applies only to samples taken after fasting for at least 8 hours.   Comment 1 Notify RN    Comment 2 Document in Chart   Glucose, capillary     Status: Abnormal   Collection Time: 11/26/20 11:50 PM  Result Value Ref Range   Glucose-Capillary 138 (H) 70 - 99 mg/dL    Comment: Glucose reference range applies only to samples taken after fasting for at least 8 hours.   Comment 1 Notify RN    Comment 2 Document in Chart   Basic metabolic panel     Status: Abnormal   Collection Time: 11/27/20  1:42 AM  Result Value Ref Range   Sodium 150 (H) 135 - 145 mmol/L   Potassium 4.1 3.5 - 5.1 mmol/L   Chloride 112 (H) 98 - 111 mmol/L   CO2 32 22 - 32 mmol/L   Glucose, Bld 125 (H) 70 - 99 mg/dL  Comment: Glucose reference range applies only to samples taken after fasting for at least 8 hours.   BUN 59 (H) 8 - 23 mg/dL   Creatinine, Ser 1.61 0.44 - 1.00 mg/dL   Calcium 9.1 8.9 - 09.6 mg/dL   GFR, Estimated >04 >54 mL/min    Comment: (NOTE) Calculated using the CKD-EPI Creatinine Equation (2021)    Anion gap 6 5 - 15    Comment: Performed at [facility], [address] [place], [place] [address]  CBC     Status: Abnormal   Collection Time: 11/27/20  1:42 AM  Result Value Ref Range   WBC 17.5 (H) 4.0 - 10.5 K/uL   RBC 4.17 3.87 - 5.11 MIL/uL   Hemoglobin 13.0 12.0 - 15.0 g/dL   HCT 91.4 78.2 - 95.6 %   MCV 99.8 80.0 - 100.0 fL   MCH 31.2 26.0 - 34.0 pg   MCHC 31.3 30.0 - 36.0 g/dL   RDW 21.3 (H) 08.6 - 57.8 %   Platelets 419 (H) 150 - 400 K/uL   nRBC 0.0 0.0 - 0.2 %    Comment: Performed at [facility], [address] [place], [place] [address]  Glucose, capillary     Status: Abnormal   Collection Time: 11/27/20  4:23 AM  Result Value Ref Range   Glucose-Capillary 131 (H) 70 - 99 mg/dL    Comment: Glucose reference range applies only to samples taken after fasting for at least 8 hours.   Comment 1 Notify RN    Comment 2 Document in Chart   Glucose, capillary     Status: Abnormal   Collection Time: 11/27/20  9:12 AM  Result Value Ref Range   Glucose-Capillary 145 (H) 70 - 99 mg/dL    Comment: Glucose reference range applies only to samples taken after fasting for at least 8 hours.  Glucose, capillary     Status: Abnormal   Collection Time: 11/27/20 12:49 PM  Result Value Ref Range   Glucose-Capillary 128 (H) 70 - 99 mg/dL    Comment: Glucose reference range applies only to samples taken after fasting for at least 8 hours.  Glucose, capillary     Status: Abnormal   Collection Time: 11/27/20  4:09 PM  Result Value Ref Range   Glucose-Capillary 100 (H) 70 - 99 mg/dL    Comment: Glucose reference range applies only to samples taken after fasting for at  least 8 hours.  Glucose, capillary     Status: Abnormal   Collection Time: 11/27/20  8:01 PM  Result Value Ref Range   Glucose-Capillary 146 (H) 70 - 99 mg/dL    Comment: Glucose reference range applies only to samples taken after fasting for at least 8 hours.  Glucose, capillary     Status: None   Collection Time: 11/28/20 12:37 AM  Result Value Ref Range   Glucose-Capillary 95 70 - 99 mg/dL    Comment: Glucose reference range applies only to samples taken after fasting for at least 8 hours.  Basic metabolic panel     Status: Abnormal   Collection Time: 11/28/20 12:45 AM  Result Value Ref Range   Sodium 150 (H) 135 - 145 mmol/L   Potassium 4.3 3.5 - 5.1 mmol/L   Chloride 115 (H) 98 - 111 mmol/L   CO2 27 22 - 32 mmol/L   Glucose, Bld 95 70 - 99 mg/dL    Comment: Glucose reference range applies only to samples taken after fasting  for at least 8 hours.   BUN 60 (H) 8 - 23 mg/dL   Creatinine, Ser 7.90 0.44 - 1.00 mg/dL   Calcium 8.9 8.9 - 24.0 mg/dL   GFR, Estimated >97 >35 mL/min    Comment: (NOTE) Calculated using the CKD-EPI Creatinine Equation (2021)    Anion gap 8 5 - 15    Comment: Performed at [facility], [address] [place], [place] [address]  CBC     Status: Abnormal   Collection Time: 11/28/20 12:45 AM  Result Value Ref Range   WBC 16.9 (H) 4.0 - 10.5 K/uL   RBC 4.14 3.87 - 5.11 MIL/uL   Hemoglobin 12.7 12.0 - 15.0 g/dL   HCT 42.6 83.4 - 19.6 %   MCV 101.2 (H) 80.0 - 100.0 fL   MCH 30.7 26.0 - 34.0 pg   MCHC 30.3 30.0 - 36.0 g/dL   RDW 22.2 (H) 97.9 - 89.2 %   Platelets 403 (H) 150 - 400 K/uL   nRBC 0.0 0.0 - 0.2 %    Comment: Performed at [facility], [address], [place], [place] [address]  Glucose, capillary     Status: Abnormal   Collection Time: 11/28/20  4:37 AM  Result Value Ref Range   Glucose-Capillary 143 (H) 70 - 99 mg/dL    Comment: Glucose reference range applies only to samples taken after fasting for at least 8 hours.   Glucose, capillary     Status: Abnormal   Collection Time: 11/28/20  8:24 AM  Result Value Ref Range   Glucose-Capillary 150 (H) 70 - 99 mg/dL    Comment: Glucose reference range applies only to samples taken after fasting for at least 8 hours.  Glucose, capillary     Status: Abnormal   Collection Time: 11/28/20 12:44 PM  Result Value Ref Range   Glucose-Capillary 115 (H) 70 - 99 mg/dL    Comment: Glucose reference range applies only to samples taken after fasting for at least 8 hours.   No results found.    Assessment/Plan CVA with dysphagia  Consult for PEG placement  - SLP following - and patient remains NPO per their recs, she is getting TF and tolerating this fine - she does have an abdominal wall hematoma secondary to coughing PTA - will discuss with MD further but can tentatively plan for PEG later this week if feeling is still that patient will need SNF and not CIR  FEN: NPO, TF via cortrak @ goal VTE: LD eliquis this AM - transition to heparin gtt ID: no current abx  peripheral vascular disease A. Fib HTN hypertrophic cardiomyopathy R ICA stenosis GERD Hx of Bell's Palsy at age 27  anxiety   Beverly Li, [facility] Surgery 11/28/2020, 3:10 PM Please see Amion for pager number during day hours 7:00am-4:30pm

## 2020-11-28 NOTE — Progress Notes (Signed)
Received from [facility] ICU.  Report received from [place].  No signs of distress.

## 2020-11-28 NOTE — TOC CAGE-AID Note (Signed)
Transition of Care [facility]) - CAGE-AID Screening   Patient Details  Name: Beverly Li MRN: [id number] Date of Birth: Oct 19, 1945  Clinical Narrative:  Patient unable to participate, admission due to a stroke, completely mute at this time. Husband at bedside denies any substance abuse problem.  CAGE-AID Screening: Substance Abuse Screening unable to be completed due to: : Patient unable to participate

## 2020-11-28 NOTE — Plan of Care (Signed)
  Problem: Pain Managment: Goal: General experience of comfort will improve Outcome: Progressing   Problem: Safety: Goal: Ability to remain free from injury will improve Outcome: Progressing   Problem: Skin Integrity: Goal: Risk for impaired skin integrity will decrease Outcome: Progressing   

## 2020-11-28 NOTE — Progress Notes (Addendum)
Occupational Therapy Treatment Patient Details Name: Beverly Li MRN: [id number] DOB: February 25, 1946 Today's Date: 11/28/2020    History of present illness 75 y.o. female who presented 11/13/20 with R sided weakness and L gaze preference. Outside window for tPA. 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 S/p thrombectomy with TICI 3 revascularization 8/1. ETT 8/1-8/2. 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 PMH: AFib on Eliquis (off x 1 week), COPD, remote tobacco abuse, hypertrophic cardiomyopathy, aortic stenosis, PAD, anxiety, arthritis, Bell's palsy, HTN, osteopenia, and GERD   OT comments  Pt demonstrating increased engagement, balance, and activity tolerance this session. Pt making eye contact with therapist and maintaining during conversation. Pt participating in UB dressing and grooming tasks at bed level with Max A and cues; requiring significant time. Challenging sitting balance at EOB and pt achieve sitting at EOB with Min Guard-Min A and support at LUE holding bed rail. Pt performing sit<>stand at EOB with Max A for peri care after bowel incontinence at bed; second person providing peri care at Total A. Max A for stand pivot to recliner. Husband arriving at end of session. VSS on 4L throughout. Updated goals. Continue to recommend dc to post-acute rehab once medically ready. Will continue to follow acutely as admitted.    Follow Up Recommendations  CIR    Equipment Recommendations  3 in 1 bedside commode;Wheelchair (measurements OT);Wheelchair cushion (measurements OT);Hospital bed    Recommendations for Other Services Rehab consult    Precautions / Restrictions Precautions Precautions: Fall Precaution Comments: cortrak, R hemiplegia       Mobility Bed Mobility Overal bed mobility: Needs  Assistance Bed Mobility: Supine to Sit     Supine to sit: Max assist;HOB elevated     General bed mobility comments: Pt bringing her LLE over to EOB with tactile cues. Max A for RLE management and elevate trunk. Pt reaching out with LUE to hold to bed in attempt to gain balance    Transfers Overall transfer level: Needs assistance Equipment used: None Transfers: Sit to/from [employer] Sit to Stand: Max assist Stand pivot transfers: Max assist       General transfer comment: Max A to power up into standing and maintain balance. Noting pt engaging in straightening LEs. Max A for pivot to recliner    Balance Overall balance assessment: Needs assistance Sitting-balance support: Single extremity supported;Feet supported Sitting balance-Leahy Scale: Poor Sitting balance - Comments: Initial Max A for sitting balance and then able achieve sitting at EOB with Min Guard- Min A and LUE for support on bedrail Postural control: Right lateral lean;Posterior lean Standing balance support: Bilateral upper extremity supported;During functional activity Standing balance-Leahy Scale: Zero Standing balance comment: Max A for standing balance                           ADL either performed or assessed with clinical judgement   ADL Overall ADL's : Needs assistance/impaired Eating/Feeding: NPO   Grooming: Maximal assistance;Bed level Grooming Details (indicate cue type and reason): Providing hand over over hand to bring hand to face. Pt then wiping eyes and bring clothe off her face. Repeating this three times; with each time, pt increasing her time washing her face  Toilet Transfer: Stand-pivot;Maximal assistance (simulated to recliner) Statistician Details (indicate cue type and reason): Max A to power up and pivot to recliner. Noting pt engaging BLEs to maintain standing with Max A which is improvement Toileting- Clothing Manipulation and  Hygiene: Total assistance;+2 for physical assistance Toileting - Clothing Manipulation Details (indicate cue type and reason): Max A for standing balance and then second person for peri care at total level     Functional mobility during ADLs: Maximal assistance (stand pivot to recliner) General ADL Comments: Pt engaging in sitting balance at EOB with Max A; abel to achieve Min Guard-Min A for sitting balance with holding bedrail at LUE. Pt then performing sit<>stand x3 to complete peri care after bowel incontience (+2). stand pivot to recliner.     Vision       Perception     Praxis      Cognition Arousal/Alertness: Awake/alert Behavior During Therapy: Flat affect Overall Cognitive Status: Difficult to assess Area of Impairment: Following commands;Safety/judgement;Awareness;Problem solving                   Current Attention Level: Focused;Sustained (sustaining short attention during eye contact; not to ADLs)   Following Commands: Follows one step commands inconsistently Safety/Judgement: Decreased awareness of safety Awareness: Intellectual Problem Solving: Slow processing General Comments: Making eye contact with therapist and sustaining it; looking at therapist when entering room. Noting change in expression in repsonce to conversation. Engaging more during simple tasks such as looking at sleeve of gown and then reaching to grasp therapist hand through it.        Exercises Exercises: General Lower Extremity General Exercises - Upper Extremity Shoulder Flexion: PROM;Right;10 reps;Supine General Exercises - Lower Extremity Ankle Circles/Pumps: PROM;10 reps;Supine;Both Short Arc Quad: AROM;PROM;Both;10 reps;Other (comment) (bed in chair position) Long Arc Quad: PROM;Both;10 reps;Other (comment) (bed in chair position) Heel Slides: PROM;Right;AAROM;Left;Supine Other Exercises Other Exercises: visual scanning past midline to the R multiple times during session Other  Exercises: PROM shoulder flexion, abduction, scapula depression, wrist fleixon / extension, prom digits. noted to have edema present   Shoulder Instructions       General Comments VSS on 4L throughout. Husband arriving at end of session    Pertinent Vitals/ Pain       Pain Assessment: Faces Faces Pain Scale: No hurt Pain Location: generalize discomfort Pain Descriptors / Indicators: Grimacing Pain Intervention(s): Monitored during session;Limited activity within patient's tolerance;Repositioned  Home Living                                          Prior Functioning/Environment              Frequency  Min 2X/week        Progress Toward Goals  OT Goals(current goals can now be found in the care plan section)  Progress towards OT goals: Progressing toward goals  Acute Rehab OT Goals Patient Stated Goal: spouse would like pt to go to [facility] OT Goal Formulation: With patient/family Time For Goal Achievement: 12/12/20 Potential to Achieve Goals: Fair ADL Goals Pt Will Perform Grooming: sitting;with mod assist Additional ADL Goal #1: pt will follow 2 step command 50% of session Additional ADL Goal #2: pt will static sit eob for 5 minutes min (A) as precursor to adls  Plan Discharge plan remains appropriate    Co-evaluation  AM-PAC OT "6 Clicks" Daily Activity     Outcome Measure   Help from another person eating meals?: Total Help from another person taking care of personal grooming?: A Lot Help from another person toileting, which includes using toliet, bedpan, or urinal?: Total Help from another person bathing (including washing, rinsing, drying)?: Total Help from another person to put on and taking off regular upper body clothing?: Total Help from another person to put on and taking off regular lower body clothing?: Total 6 Click Score: 7    End of Session Equipment Utilized During Treatment: Gait belt;Oxygen  OT  Visit Diagnosis: Unsteadiness on feet (R26.81);Muscle weakness (generalized) (M62.81);Hemiplegia and hemiparesis Hemiplegia - Right/Left: Right Hemiplegia - dominant/non-dominant: Dominant Hemiplegia - caused by: Cerebral infarction   Activity Tolerance Patient tolerated treatment well   Patient Left in chair;with call bell/phone within reach;with chair alarm set   Nurse Communication Mobility status;Need for lift equipment        Time: 1405-1505 OT Time Calculation (min): 60 min  Charges: OT General Charges $OT Visit: 1 Visit OT Treatments $Self Care/Home Management : 38-52 mins $Therapeutic Activity: 8-22 mins  Thayer Embleton MSOT, OTR/L Acute Rehab Pager: [phone] Office: [phone]   Theodoro Grist Tyrea Froberg 11/28/2020, 5:09 PM

## 2020-11-28 NOTE — TOC Progression Note (Addendum)
Transition of Care [facility]) - Progression Note    Patient Details  Name: Beverly Li MRN: [id number] Date of Birth: 09/12/1945  Transition of [facility]) CM/SW Contact  Mearl Latin, LCSW Phone Number: 11/28/2020, 11:03 AM  Clinical Narrative:    CSW spoke with patient's daughter in law, [place] [phone]). She just wanted to add [place] to the SNF list. She reported understanding of referral process and will await medical readiness. Handoff provided to unit CSW.     Barriers to Discharge: Continued Medical Work up  Expected Discharge Plan and Services   In-house Referral: Clinical Social Work   Post Acute Care Choice: Skilled Nursing Facility Living arrangements for the past 2 months: Single Family Home                                       Social Determinants of Health (SDOH) Interventions    Readmission Risk Interventions No flowsheet data found.

## 2020-11-28 NOTE — Progress Notes (Signed)
**Note Beverly-Identified via Obfuscation** Patient ID: [name], female   DOB: 11/30/1945, 75 y.o.   MRN: [id number] Patient ID: Beverly Li, female   DOB: 1945-05-29, 75 y.o.   MRN: [id number] STROKE TEAM PROGRESS NOTE   Interval History    Patient husband and son are at the bedside. No acute events overnight.  She continues to have significant global aphasia as well as dysphagia neuro exam is unchanged.[name]  Her legs are warm today with good pulses noted in both feet..  I spoke at length with patient's husband and son about her persistent dysphagia and likely the need for PEG tube for long-term nutrition and they are in agreement.    Pertinent Lab Work and Imaging    11/13/20 CT Head WO IV Contrast 1. Mild asymmetric hypodensity of the left insula, concerning for acute left MCA territory infarct. An MRI could further evaluate. 2. Hyperdense appearance of the distal left M1 MCA, concerning for thrombus. Recommend CTA to further evaluate. 3. No acute hemorrhage. 4. Remote lacunar infarcts versus dilated perivascular spaces in bilateral inferior basal ganglia. Suspected remote lacunar infarct in the right cerebellum.  11/13/20 CT Angio Head and Neck W WO IV Contrast 1. Occlusion of the left M1 MCA with very little opacification of distal left MCA branches. Perfusion demonstrates the core infarct of 41 mL in the anterior left MCA territory with large (122 ml) of ischemic phenomena in the remaining left MCA and potentially left ACA territories. 2. Age indeterminate occlusion of the proximal right internal carotid artery in the neck with non-opacification through the carotid terminus. The right MCA and right posterior communicating artery are opacified via the of the right A1 ACA. 3. Suspected high-grade stenosis versus occlusion of the distal left A3/A4 ACA. 4. Approximately 40-50% stenosis of the proximal left ICA in the neck. 5. Severe stenosis of the non dominant/small right vertebral artery origin. Moderate stenosis of the  intradural left vertebral artery. 6. Severe emphysema.  11/14/20 MRI Brain WO IV Contrast 1. 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 Associated edema without mass effect. 2. Additional scattered punctate acute infarcts in the right frontoparietal cortex and small acute infarct in the right cerebellum.  11/14/20 Echocardiogram Complete   1. There is no evidence of systolic anterior motion of the mitral valve or true LV outflow obstruction on this study. LVOT velocities are mildly elevated at 2 m/s. Left ventricular ejection fraction, by estimation, is 60 to 65%. The left ventricle has low normal function. The left ventricle has no regional wall motion abnormalities. There is moderate concentric left ventricular hypertrophy. Left ventricular diastolic parameters are consistent with Grade II diastolic dysfunction (pseudonormalization). Elevated left atrial pressure.   2. Right ventricular systolic function is normal. The right ventricular size is normal.   3. Left atrial size was severely dilated.   4. Right atrial size was mildly dilated.   5. The mitral valve is degenerative. Mild to moderate mitral valve regurgitation.   6. The aortic valve is tricuspid. There is moderate calcification of the aortic valve. There is mild thickening of the aortic valve. Aortic valve regurgitation is mild. Mild to moderate aortic valve  sclerosis/calcification is present, without any evidence of aortic stenosis.   7. The inferior vena cava is dilated in size with <50% respiratory variability, suggesting right atrial pressure of 15 mmHg.   Physical Examination    Temp:  [97.7 F (36.5 C)-98.6 F (37 C)] 97.7 F (36.5 C) (08/16 0826) Pulse Rate:  [55-64] 59 (  08/16 0831) Resp:  [15-22] 20 (08/16 0826) BP: (109-175)/(61-85) 150/85 (08/16 0826) SpO2:  [93 %-100 %] 99 % (08/16 0006) Weight:  [49.5 kg] 49.5 kg (08/16 0500)  General - Well nourished, well developed, elderly Caucasian lady on [place]  oxygen.  Ophthalmologic - fundi not visualized due to noncooperation.  Cardiovascular -  not in afib.  Ext: right leg cold to touch compared to left. Perfusion still present, dorsalis pedis pulse is noted bilaterally.  No swelling or redness noted.  Neuro -  MS: awake, alert.  Globally aphasic and not following commands.  Making a few guttural noises but no comprehensible speech CN 2-12: eyes spontanousely open, tracking bilaterally.   has globa aphasia, nonverbal, not following commands, not able to name and repeat and read. Left gaze preference but able to cross midline. Right facial droop.   Motor:  LUE and LLE purposeful movement against gravity. LUE able to hold with no drift. LLE can hold at knee flexion and foot on bed position. RUE and RLE no movement to pain stimulation, bilateral babinski positive.  Sensory: pain sensation in left arm/leg. Absent on right. coordination and gait unable to test due to aphasia.    Assessment and Plan   Beverly Li is a 75 y.o. female w/pmh of AFib on Eliquis (off x 1 week for abdominal hematoma), COPD, remote tobacco abuse, hypertrophic cardiomyopathy, aortic stenosis, PAD who presents with right sided weakness, right facial droop, and left gaze preference. 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  MRI Brain revealed left MCA and ACA stroke.  CTA Head and Neck showed occlusion of the left MCA and an age indeterminate occlusion of the proximal right internal carotid artery in the neck with non-opacification through the carotid terminus.  Echo w/EF 60 to 65 %, left atrium severely dilated.  LDL 120  hemoglobin a1c 6.0.  Heparin IV transitioned to eliquis 11/18/20 Stable  # New right leg vascular stenosis: CTA shows possible distal right femoropopliteal bypass occlusion. Seen by vascular surgery. No  intervention recommended.  Clinically better today with warm legs and dorsalis pedis pulse present b/l.  Con't to monitor.  #PAF Was on elqiuis, on hold PTA due to abdominal hematoma Heparin IV transitioned to eliquis 11/18/20 Afib RVR - cardiology on board Converted to sinus 8/8 Now on po amiodarone  #Respiratory distress, improved - Was intubated due to respiratory distress - extubated to 4L on 11/14/20.  - wax and wane respiratory distress - overall getting better over time - CXR 11/18/20 - partial lung collapse s/p CPAP -> d/c -> repeat CXR 11/19/20 with improvement - CXR 8/10 - Cardiomegaly and evidence of small bilateral pleural effusions with atelectasis, improved since 11/19/2020.   #Hypertension Diltiazem 30 mg QD, Nebivolol 10 mg QD and Hydralazine 25 mg Q8 at home.  Currently blood pressure goal is normotensive.   metoprolol 25mg  bid hydralazine 25mg  Q8h BP stable on the high end  #R ICA stenosis per Dr. , IR showed severe pre occlusive stenosis of the proximal right internal carotid artery associated with a delayed string sign found during cerebral angiogram, patient may require NIR intervention for the Rt ICA stenosis in the future  Follow up with Dr. as outpt Avoid low BP   #Abd hematoma - reportedly developed after coughing 2 weeks prior to  admission - Eliquis stopped in hospital in [place] - hematoma has greatly improved - tolerating IV heparin - switch back to eliquis 11/18/20  #Hyperlipidemia LDL goal is < 70.  LDL is 120 Allergic to statin meds Continue home zetia  Dysphagia  Currently NPO on NGT and TF @ 55 Speech following  Other stroke risk factors Advanced age Former smoker PAD  Other acute issue Leukocytosis - WBC 17.2->14.2->12.0->12.3->12.0->10.3->12.6->15.0   Hospital day # 15 Patient continues to be globally aphasic with dense right hemiplegia and having dysphagia.     Lon patient will likely not be able to swallow  safely for quite some time hence she will benefit with PEG tube placement for long-term nutrition and her medication needs.g discussion with patient husband and son at the bedside and they are agreeable with PEG tube placement later this week.  Plan to discontinue Eliquis and start IV heparin drip till 1 hour before PEG tube which hopefully will be done in the next 2 to 3 days.  We will consult trauma team for PEG tube.  Discussed with Dr. Denton Lank l and answered questions. Greater than 50% time during the visit was spent on counseling and coordination of care and discussion with care team.   Delia Heady, MD Neurology, Stroke Team. 11/28/2020 12:24 PM   To contact Stroke Continuity provider, please refer to [email or web address]. After hours, contact General Neurology

## 2020-11-28 NOTE — Progress Notes (Addendum)
PROGRESS NOTE    Beverly Li   IRW:[id number]  DOB: 02/19/1946  PCP: Clayborn Heron, MD    DOA: 11/13/2020 LOS: 15   Assessment & Plan   Active Problems:   Obstructive hypertrophic cardiomyopathy (HCC)   Stroke (HCC)   Middle cerebral artery embolism, left   Protein-calorie malnutrition, severe   Pressure injury of skin   Left MCA Stroke - s/p thrombectomy with TICI3.   In setting of Eliquis being held x 1 week due to an abdominal wall hematoma. --Neurology following --Surgery consulted for PEG placement --SNF placement pending; pt unable to participate to extent needed for CIR --Resumed on Eliquis  Dysphagia - CorTrak on place with tube feeds.  NPO. PEG to be placed by surgery. Speech following  Hypernatremia - increase free water flushes 100 cc q8h>>200 cc q6h.  Monitor BMP  Leukocytosis - suspect reactive. Monitor for s/sx's of infection and follow CBC.  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  Appears resolved, pt has normal temp distal extremity and palpable pedal pulse.  Paroxysmal A-fib - cont Elquis, amiodarone.  Cardiology consulted.  RVR has resolved.  Hypertension - continue current regimen. Monitor BP  Acute respiratoy distress - s/p intubation, extubated 11/14/20.  Stable on 4 L/min oxygen.  Maintain sats above 90%, wean as tolerated.  Right ICA stenosis - may require intervention in the future.  Monitor.    Abominal wall hematoma - 2/2 coughing spell 2 weeks prior to admission.  Eliquis was held at outside hospital where seen for this.  Hematoma improving. --Monitor on anticoagulation   Hyperlipidemia - allergic to statins.  On Zetia.  Severe Malnutrition related to chronic illness (COPD) as evidenced by severe muscle depletion, severe fat depletion.  INTERVENTION: Tube feeding via Cortrak tube: Jevity 1.2 at 55 ml/h (1320 ml per day) Prosource TF 45 ml BID Provides 1664 kcal, 95 gm protein, 1070 ml  free water daily.  100 ml free water every 8 hours  Total free water: 1370 ml      Patient BMI: Body mass index is 19.33 kg/m.   DVT prophylaxis: SCDs Start: 11/13/20 1608 Place and maintain sequential compression device Start: 11/13/20 1431   Diet:  Diet Orders (From admission, onward)     Start     Ordered   11/13/20 1702  Diet NPO time specified  Diet effective now        11/13/20 1702              Code Status: Prior   Brief Narrative / Hospital Course to Date:   75 y.o. female w/pmh of AFib on Eliquis (off x 1 week for abdominal hematoma), COPD, remote tobacco abuse, hypertrophic cardiomyopathy, aortic stenosis, PAD  presented with right sided weakness, right facial droop, and left gaze preference. Underwent thrombectomy with TICI 3 revascularization.  She has been put back on Eliquis.  Pt has dense right-sided hemiparesis, aphasia.   Plan is for PEG tube for longer term nutrition.  TRH assumed care 8/16.    Subjective 11/28/20    Pt seen with husband at bedside.  This is my first day meeting her.  She does not meaningfully respond or interact, doesn't follow commands.  Discussed plan for PEG tube.  No acute events reported.   Disposition Plan & Communication   Status is: Inpatient  Remains inpatient appropriate because:Inpatient level of care appropriate due to severity of illness.  Requires PEG tube placement for long-term nutrition prior  to d/c to SNF.  Pt is high risk for clinical deterioration given the severity of illness.    Dispo: The patient is from: Home              Anticipated d/c is to: SNF              Patient currently is not medically stable to d/c.   Difficult to place patient No   Family Communication: husband at bedside on rounds    Consults, Procedures, Significant Events   Consultants:  Neurology Surgery  Procedures:  Thrombectomy  Antimicrobials:  Anti-infectives (From admission, onward)    Start     Dose/Rate Route  Frequency Ordered Stop   11/13/20 1423  ceFAZolin (ANCEF) 2-4 GM/100ML-% IVPB       Note to Pharmacy: [name]: cabinet override      11/13/20 1423 11/14/20 0229         Micro    Objective   Vitals:   11/28/20 0826 11/28/20 0831 11/28/20 1245 11/28/20 1545  BP: (!) 150/85  (!) 147/73 131/69  Pulse: (!) 59 (!) 59 (!) 53 (!) 51  Resp: 20  16 16   Temp: 97.7 F (36.5 C)  97.6 F (36.4 C) 97.7 F (36.5 C)  TempSrc: Axillary  Axillary Axillary  SpO2:   98% 98%  Weight:      Height:        Intake/Output Summary (Last 24 hours) at 11/28/2020 2016 Last data filed at 11/28/2020 1800 Gross per 24 hour  Intake 415 ml  Output 450 ml  Net -35 ml   Filed Weights   11/24/20 0500 11/25/20 0400 11/28/20 0500  Weight: 50.2 kg 48.3 kg 49.5 kg    Physical Exam:  General exam: awake, alert, no acute distress HEENT: clear conjunctiva, anicteric sclera, moist mucus membranes Respiratory system: CTAB anteriorly, normal respiratory effort. Cardiovascular system: normal S1/S2, RRR, no pedal edema.   Gastrointestinal system: soft, NT, ND Central nervous system: R hemiplegia, complete aphasia, does not follow commands Skin: dry, intact, normal temperature   Labs   Data Reviewed: I have personally reviewed following labs and imaging studies  CBC: Recent Labs  Lab 11/24/20 0351 11/25/20 0038 11/26/20 0223 11/27/20 0142 11/28/20 0045  WBC 15.0* 16.7* 17.4* 17.5* 16.9*  NEUTROABS 12.9*  --   --   --   --   HGB 13.8 12.6 13.3 13.0 12.7  HCT 44.8 41.7 44.0 41.6 41.9  MCV 101.6* 101.5* 101.9* 99.8 101.2*  PLT 449* 428* 434* 419* 403*   Basic Metabolic Panel: Recent Labs  Lab 11/24/20 0351 11/25/20 0038 11/26/20 0223 11/27/20 0142 11/28/20 0045  NA 146* 146* 149* 150* 150*  K 3.7 4.4 4.5 4.1 4.3  CL 98 105 108 112* 115*  CO2 40* 34* 31 32 27  GLUCOSE 124* 122* 119* 125* 95  BUN 46* 50* 52* 59* 60*  CREATININE 0.81 0.80 0.82 0.88 0.81  CALCIUM 9.2 9.0 9.2 9.1  8.9  MG 2.4  --   --   --   --    GFR: Estimated Creatinine Clearance: 46.9 mL/min (by C-G formula based on SCr of 0.81 mg/dL). Liver Function Tests: No results for input(s): AST, ALT, ALKPHOS, BILITOT, PROT, ALBUMIN in the last 168 hours. No results for input(s): LIPASE, AMYLASE in the last 168 hours. No results for input(s): AMMONIA in the last 168 hours. Coagulation Profile: No results for input(s): INR, PROTIME in the last 168 hours. Cardiac Enzymes: No results for  input(s): CKTOTAL, CKMB, CKMBINDEX, TROPONINI in the last 168 hours. BNP (last 3 results) No results for input(s): PROBNP in the last 8760 hours. HbA1C: No results for input(s): HGBA1C in the last 72 hours. CBG: Recent Labs  Lab 11/28/20 0037 11/28/20 0437 11/28/20 0824 11/28/20 1244 11/28/20 1544  GLUCAP 95 143* 150* 115* 136*   Lipid Profile: No results for input(s): CHOL, HDL, LDLCALC, TRIG, CHOLHDL, LDLDIRECT in the last 72 hours. Thyroid Function Tests: No results for input(s): TSH, T4TOTAL, FREET4, T3FREE, THYROIDAB in the last 72 hours. Anemia Panel: No results for input(s): VITAMINB12, FOLATE, FERRITIN, TIBC, IRON, RETICCTPCT in the last 72 hours. Sepsis Labs: No results for input(s): PROCALCITON, LATICACIDVEN in the last 168 hours.  No results found for this or any previous visit (from the past 240 hour(s)).    Imaging Studies   No results found.   Medications   Scheduled Meds:  [START ON 12/06/2020] amiodarone  200 mg Per Tube Daily   amiodarone  400 mg Per Tube BID   arformoterol  15 mcg Nebulization BID   budesonide (PULMICORT) nebulizer solution  0.5 mg Nebulization BID   buPROPion  75 mg Per Tube BID   chlorhexidine  15 mL Mouth Rinse BID   ezetimibe  10 mg Per Tube Daily   feeding supplement (PROSource TF)  45 mL Per Tube BID   free water  100 mL Per Tube Q8H   guaiFENesin  15 mL Per Tube Q6H   hydrALAZINE  50 mg Per Tube BID   insulin aspart  0-9 Units Subcutaneous Q4H   mouth  rinse  15 mL Mouth Rinse q12n4p   metoprolol tartrate  25 mg Per Tube BID   pantoprazole sodium  40 mg Per Tube QHS   polyethylene glycol  17 g Per Tube Daily   revefenacin  175 mcg Nebulization Daily   Continuous Infusions:  feeding supplement (JEVITY 1.2 CAL) 1,000 mL (11/26/20 2100)   heparin         LOS: 15 days    Time spent: 30 minutes    Pennie Banter, DO [facility]  11/28/2020, 8:16 PM      If 7PM-7AM, please contact night-coverage. How to contact the [facility] Attending or Consulting provider 7A - 7P or covering provider during after hours 7P -7A, for this patient?    Check the care team in [facility] and look for a) attending/consulting [facility] provider listed and b) the [facility] team listed Log into [email or web address] and use [place]'s universal password to access. If you do not have the password, please contact the hospital operator. Locate the [facility] provider you are looking for under [facility] and page to a number that you can be directly reached. If you still have difficulty reaching the provider, please page the [facility] (Director on Call) for the Hospitalists listed on amion for assistance.

## 2020-11-28 NOTE — Discharge Instructions (Signed)

## 2020-11-28 NOTE — Care Management Important Message (Signed)
Important Message  Patient Details  Name: Beverly Li MRN: [id number] Date of Birth: 1946/01/16   Medicare Important Message Given:  Yes     Dorena Bodo 11/28/2020, 4:19 PM

## 2020-11-28 NOTE — Plan of Care (Signed)
  Problem: Nutrition: Goal: Risk of aspiration will decrease Outcome: Progressing Goal: Dietary intake will improve Outcome: Progressing   Problem: Clinical Measurements: Goal: Ability to maintain clinical measurements within normal limits will improve Outcome: Progressing Goal: Will remain free from infection Outcome: Progressing Goal: Diagnostic test results will improve Outcome: Progressing   Problem: Activity: Goal: Risk for activity intolerance will decrease Outcome: Progressing   Problem: Coping: Goal: Level of anxiety will decrease Outcome: Progressing   Problem: Elimination: Goal: Will not experience complications related to bowel motility Outcome: Progressing Goal: Will not experience complications related to urinary retention Outcome: Progressing   Problem: Pain Managment: Goal: General experience of comfort will improve Outcome: Progressing   Problem: Safety: Goal: Ability to remain free from injury will improve Outcome: Progressing   Problem: Skin Integrity: Goal: Risk for impaired skin integrity will decrease Outcome: Progressing

## 2020-11-28 NOTE — Progress Notes (Signed)
ANTICOAGULATION CONSULT NOTE - Initial Consult  Pharmacy Consult for Heparin Indication: h/o Afib, recent CVA, PVD  Allergies  Allergen Reactions   Amoxicillin Other (See Comments)    UTI Has patient had a PCN reaction causing immediate rash, facial/tongue/throat swelling, SOB or lightheadedness with hypotension: No Has patient had a PCN reaction causing severe rash involving mucus membranes or skin necrosis: No Has patient had a PCN reaction that required hospitalization: No Has patient had a PCN reaction occurring within the last 10 years: Yes--UTI ONLY If all of the above answers are "NO", then may proceed with Cephalosporin use.    Atenolol Cough   Crestor [Rosuvastatin Calcium] Other (See Comments)    Muscle aches   Pravastatin Other (See Comments)    Muscle aches   Sulfa Antibiotics Nausea Only   Codeine     hallucinations    Patient Measurements: Height: 5\' 3"  (160 cm) Weight: 49.5 kg (109 lb 2 oz) IBW/kg (Calculated) : 52.4 Heparin Dosing Weight: 49.5 kg  Vital Signs: Temp: 97.7 F (36.5 C) (08/16 0826) Temp Source: Axillary (08/16 0826) BP: 150/85 (08/16 0826) Pulse Rate: 59 (08/16 0831)  Labs: Recent Labs    11/26/20 0223 11/27/20 0142 11/28/20 0045  HGB 13.3 13.0 12.7  HCT 44.0 41.6 41.9  PLT 434* 419* 403*  CREATININE 0.82 0.88 0.81    Estimated Creatinine Clearance: 46.9 mL/min (by C-G formula based on SCr of 0.81 mg/dL).   Medical History:   Assessment: Anticoag: Eliquis PTA for Afib (held x 1 week for recent hematoma) > new stroke. -Apixaban resumed 8/10>8/16. Back to IV heparin for PEG tube. Hgb 12.7 WNL. Plts 403 WNL.  Goal of Therapy:  Heparin level 0.3-0.5 units/ml Monitor platelets by anticoagulation protocol: Yes   Plan:  D/c Eliquis (LD 8/16 1130) Start IV heparin (no bolus) at 2330 at rate of 700 units/hr Daily HL, aPTT, and CBC. PEG later this week.   Tomasina Keasling S. 9/16, PharmD, BCPS Clinical Staff  Pharmacist [email or web address], Merilynn Finland 11/28/2020,12:46 PM

## 2020-11-29 DIAGNOSIS — I63512 Cerebral infarction due to unspecified occlusion or stenosis of left middle cerebral artery: Secondary | ICD-10-CM | POA: Diagnosis not present

## 2020-11-29 DIAGNOSIS — I6602 Occlusion and stenosis of left middle cerebral artery: Secondary | ICD-10-CM | POA: Diagnosis not present

## 2020-11-29 DIAGNOSIS — E43 Unspecified severe protein-calorie malnutrition: Secondary | ICD-10-CM | POA: Diagnosis not present

## 2020-11-29 DIAGNOSIS — I421 Obstructive hypertrophic cardiomyopathy: Secondary | ICD-10-CM | POA: Diagnosis not present

## 2020-11-29 LAB — CBC
HCT: 42.6 % (ref 36.0–46.0)
Hemoglobin: 12.7 g/dL (ref 12.0–15.0)
MCH: 30.7 pg (ref 26.0–34.0)
MCHC: 29.8 g/dL — ABNORMAL LOW (ref 30.0–36.0)
MCV: 102.9 fL — ABNORMAL HIGH (ref 80.0–100.0)
Platelets: 388 10*3/uL (ref 150–400)
RBC: 4.14 MIL/uL (ref 3.87–5.11)
RDW: 17.8 % — ABNORMAL HIGH (ref 11.5–15.5)
WBC: 14 10*3/uL — ABNORMAL HIGH (ref 4.0–10.5)
nRBC: 0 % (ref 0.0–0.2)

## 2020-11-29 LAB — BASIC METABOLIC PANEL
Anion gap: 8 (ref 5–15)
BUN: 60 mg/dL — ABNORMAL HIGH (ref 8–23)
CO2: 30 mmol/L (ref 22–32)
Calcium: 8.9 mg/dL (ref 8.9–10.3)
Chloride: 113 mmol/L — ABNORMAL HIGH (ref 98–111)
Creatinine, Ser: 0.75 mg/dL (ref 0.44–1.00)
GFR, Estimated: >60 mL/min (ref >60)
Glucose, Bld: 140 mg/dL — ABNORMAL HIGH (ref 70–99)
Potassium: 4.4 mmol/L (ref 3.5–5.1)
Sodium: 151 mmol/L — ABNORMAL HIGH (ref 135–145)

## 2020-11-29 LAB — APTT
aPTT: 34 seconds (ref 24–36)
aPTT: 38 seconds — ABNORMAL HIGH (ref 24–36)
aPTT: 46 seconds — ABNORMAL HIGH (ref 24–36)

## 2020-11-29 LAB — GLUCOSE, CAPILLARY
Glucose-Capillary: 126 mg/dL — ABNORMAL HIGH (ref 70–99)
Glucose-Capillary: 130 mg/dL — ABNORMAL HIGH (ref 70–99)
Glucose-Capillary: 133 mg/dL — ABNORMAL HIGH (ref 70–99)
Glucose-Capillary: 135 mg/dL — ABNORMAL HIGH (ref 70–99)
Glucose-Capillary: 137 mg/dL — ABNORMAL HIGH (ref 70–99)
Glucose-Capillary: 139 mg/dL — ABNORMAL HIGH (ref 70–99)
Glucose-Capillary: 151 mg/dL — ABNORMAL HIGH (ref 70–99)
Glucose-Capillary: 160 mg/dL — ABNORMAL HIGH (ref 70–99)

## 2020-11-29 LAB — PHOSPHORUS: Phosphorus: 3.4 mg/dL (ref 2.5–4.6)

## 2020-11-29 LAB — MAGNESIUM: Magnesium: 2.3 mg/dL (ref 1.7–2.4)

## 2020-11-29 LAB — HEPARIN LEVEL (UNFRACTIONATED): Heparin Unfractionated: >1.1 IU/mL — ABNORMAL HIGH (ref 0.30–0.70)

## 2020-11-29 MED ORDER — METOPROLOL TARTRATE 12.5 MG HALF TABLET
12.5000 mg | ORAL_TABLET | Freq: Two times a day (BID) | ORAL | Status: DC
  Administered 2020-11-30 – 2020-12-06 (×13): 12.5 mg
  Filled 2020-11-29 (×14): qty 1

## 2020-11-29 MED ORDER — FREE WATER
200.0000 mL | Status: DC
  Administered 2020-11-29 – 2020-12-02 (×12): 200 mL

## 2020-11-29 NOTE — Progress Notes (Signed)
  Speech Language Pathology Treatment: Dysphagia;Cognitive-Linquistic  Patient Details Name: Beverly Li MRN: [id number] DOB: February 02, 1946 Today's Date: 11/29/2020 Time: 1435-1510 SLP Time Calculation (min) (ACUTE ONLY): 35 min  Assessment / Plan / Recommendation Clinical Impression  Pt seen for skilled treatment focusing on aphasia/apraxia and dysphagia.  Pt is scheduled to receive a PEG tube 8/19 per neurology note review. This was discussed with family members (husband/son) with questions addressed re: swallowing ability/function.  During tx session, pt's ability to contain and manipulate ice chips given SLP facilitation of dry spoon presentation/lingual pressure/mod-max multimodal cues and repetition impacted by CN VII weakness and reduced range of motion resulting in oral holding and eventual oral suctioning of boluses.  Despite skilled interventions listed above, pt unable to orally transit and absence of pharyngeal swallow initiation noted.  Husband reported his wife does exhibit spontaneous swallows throughout the day.   Continued mild improvements with wakefulness, attention and communication. During oral care pt with nonverbal communication relaying dislike of oral suctioning and cold temperature of ice shaking her body as if exaggerated shivering when given trials. Her husband confirmed she does not prefer ice in drinks. She initiated increased eye contact when asked questions, but was not able to vocalize/hum to familiar tune. Most verbalizations were unintelligible/incomprehensible.  Pt able to follow simple directives with 20% accuracy with global aphasia persisting.     Discussed session with family and answered questions re: general rehab plan going forward for ST. Educated for strategies to facilitate communication and cognition ongoing.    HPI HPI: 75 year old female with proximal A. 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 Intubated 8/1, extubated 8/2. MRI shows  Acute infarcts involving the left basal ganglia, left insula, overlying  left frontal lobe and high left parasagittal frontal lobe cortex,  compatible with left ACA and MCA territory infarcts. Small areas of  acute infarct in the left frontal lobe white matter and punctate  left parietal cortical infarct, also left ACA and MCA territories.  Associated edema without mass effect. Additional punctate acute  infarcts in right frontal and parietal cortex. Small acute right  cerebellar infarct. Partial lung collapse 8/7 requiring temporary CPAP.      SLP Plan  Continue with current plan of care       Recommendations  Diet recommendations: NPO Medication Administration: Via alternative means                Oral Care Recommendations: Oral care QID Follow up Recommendations: Inpatient Rehab SLP Visit Diagnosis: Dysphagia, unspecified (R13.10) Plan: Continue with current plan of care                       Tressie Stalker, M.S., CCC-SLP 11/29/2020, 3:59 PM

## 2020-11-29 NOTE — Plan of Care (Signed)

## 2020-11-29 NOTE — Progress Notes (Signed)
1300 Husband at bedside concerned about pt HR in the 40's, patient assessed and no changes, central telemetry called and said patient has been sinus brady, but while on the phone with RN saw some junctional beats, MD informed and assessed rhythms on the monitors at nurse's station, MD went to bedside to speak to husband. Husband left bedside and then son came to bedside and asked same questions about HR and was educated by RN on central telemetry monitoring, reassured that patient was being monitored. RN also informed son that MD came to bedside to assess earlier with his father present.

## 2020-11-29 NOTE — Progress Notes (Signed)
ANTICOAGULATION CONSULT NOTE - Follow-Up Consult  Pharmacy Consult for IV Heparin Indication: Hx Afib, recent CVA, PVD  Allergies  Allergen Reactions   Amoxicillin Other (See Comments)    UTI Has patient had a PCN reaction causing immediate rash, facial/tongue/throat swelling, SOB or lightheadedness with hypotension: No Has patient had a PCN reaction causing severe rash involving mucus membranes or skin necrosis: No Has patient had a PCN reaction that required hospitalization: No Has patient had a PCN reaction occurring within the last 10 years: Yes--UTI ONLY If all of the above answers are "NO", then may proceed with Cephalosporin use.    Atenolol Cough   Crestor [Rosuvastatin Calcium] Other (See Comments)    Muscle aches   Pravastatin Other (See Comments)    Muscle aches   Sulfa Antibiotics Nausea Only   Codeine     hallucinations    Patient Measurements: Height: 5\' 3"  (160 cm) Weight: 49.7 kg (109 lb 9.1 oz) IBW/kg (Calculated) : 52.4 Heparin Dosing Weight: 49.5 kg  Vital Signs: Temp: 98.2 F (36.8 C) (08/17 1539) Temp Source: Axillary (08/17 1539) BP: 122/61 (08/17 1539) Pulse Rate: 49 (08/17 1539)  Labs: Recent Labs    11/27/20 0142 11/28/20 0045 11/29/20 0128 11/29/20 0853 11/29/20 1653  HGB 13.0 12.7 12.7  --   --   HCT 41.6 41.9 42.6  --   --   PLT 419* 403* 388  --   --   APTT  --   --  34 38* 46*  HEPARINUNFRC  --   --  >1.10*  --   --   CREATININE 0.88 0.81 0.75  --   --     Estimated Creatinine Clearance: 47.7 mL/min (by C-G formula based on SCr of 0.75 mg/dL).  Assessment: Pharmacy was consulted to dose IV heparin for this 75 yr old woman on apixaban PTA for Afib (held x 1 week for recent hematoma; now on hold for PEG placement on 8/19). Pt with new stroke; she has an abdominal wall hematoma secondary to coughing PTA.  Apixaban was resumed 8/10>8/16; transitioned back to IV heparin for PEG tube. Hgb 12.7 WNL/stable. Plts 403 WNL/stable. Given  recent apixaban exposure, will monitor anticoagulation using aPTT until aPTT and heparin levels correlate.  aPTT ~6 hrs after heparin infusion was increased to 850 units/hr was 46 sec, which is below the goal range for this pt. Per RN, no issues with IV or bleeding observed.  Planning for PEG on 8/19  Goal of Therapy:  Heparin level 0.3-0.5 units/ml aPTT 66-85 sec Monitor platelets by anticoagulation protocol: Yes   Plan:  Increase IV heparin (no bolus) to 950 units/hr Check aPTT/heparin level in ~7 hrs Monitor daily aPTT, heparin level, CBC Monitor for bleeding  9/19, PharmD, BCPS, [facility] Clinical Pharmacist 11/29/2020,6:16 PM

## 2020-11-29 NOTE — Progress Notes (Signed)
Patient ID: Beverly Li, female   DOB: 08-27-1945, 75 y.o.   MRN: [id number] Patient ID: Beverly Li, female   DOB: May 19, 1945, 75 y.o.   MRN: [id number] STROKE TEAM PROGRESS NOTE   Interval History    Patient husband  a`st the bedside. No acute events overnight.  She continues to have significant global aphasia as well as dysphagia neuro exam is unchanged..   PEG tube is planned by trauma team on 12/01/2020.  No new issues.    Pertinent Lab Work and Imaging    11/13/20 CT Head WO IV Contrast 1. Mild asymmetric hypodensity of the left insula, concerning for acute left MCA territory infarct. An MRI could further evaluate. 2. Hyperdense appearance of the distal left M1 MCA, concerning for thrombus. Recommend CTA to further evaluate. 3. No acute hemorrhage. 4. Remote lacunar infarcts versus dilated perivascular spaces in bilateral inferior basal ganglia. Suspected remote lacunar infarct in the right cerebellum.  11/13/20 CT Angio Head and Neck W WO IV Contrast 1. Occlusion of the left M1 MCA with very little opacification of distal left MCA branches. Perfusion demonstrates the core infarct of 41 mL in the anterior left MCA territory with large (122 ml) of ischemic phenomena in the remaining left MCA and potentially left ACA territories. 2. Age indeterminate occlusion of the proximal right internal carotid artery in the neck with non-opacification through the carotid terminus. The right MCA and right posterior communicating artery are opacified via the of the right A1 ACA. 3. Suspected high-grade stenosis versus occlusion of the distal left A3/A4 ACA. 4. Approximately 40-50% stenosis of the proximal left ICA in the neck. 5. Severe stenosis of the non dominant/small right vertebral artery origin. Moderate stenosis of the intradural left vertebral artery. 6. Severe emphysema.  11/14/20 MRI Brain WO IV Contrast 1. 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 Associated edema  without mass effect. 2. Additional scattered punctate acute infarcts in the right frontoparietal cortex and small acute infarct in the right cerebellum.  11/14/20 Echocardiogram Complete   1. There is no evidence of systolic anterior motion of the mitral valve or true LV outflow obstruction on this study. LVOT velocities are mildly elevated at 2 m/s. Left ventricular ejection fraction, by estimation, is 60 to 65%. The left ventricle has low normal function. The left ventricle has no regional wall motion abnormalities. There is moderate concentric left ventricular hypertrophy. Left ventricular diastolic parameters are consistent with Grade II diastolic dysfunction (pseudonormalization). Elevated left atrial pressure.   2. Right ventricular systolic function is normal. The right ventricular size is normal.   3. Left atrial size was severely dilated.   4. Right atrial size was mildly dilated.   5. The mitral valve is degenerative. Mild to moderate mitral valve regurgitation.   6. The aortic valve is tricuspid. There is moderate calcification of the aortic valve. There is mild thickening of the aortic valve. Aortic valve regurgitation is mild. Mild to moderate aortic valve  sclerosis/calcification is present, without any evidence of aortic stenosis.   7. The inferior vena cava is dilated in size with <50% respiratory variability, suggesting right atrial pressure of 15 mmHg.   Physical Examination    Temp:  [97.6 F (36.4 C)-98.5 F (36.9 C)] 97.8 F (36.6 C) (08/17 1130) Pulse Rate:  [50-59] 59 (08/17 1130) Resp:  [15-20] 18 (08/17 1130) BP: (131-169)/(68-78) 140/76 (08/17 1130) SpO2:  [96 %-100 %] 96 % (08/17 1130) Weight:  [49.7 kg] 49.7 kg (08/17  0500)  General - Well nourished, well developed, elderly Caucasian lady on [place] oxygen.  Ophthalmologic - fundi not visualized due to noncooperation.  Cardiovascular -  not in afib.  Ext: right leg cold to touch compared to left. Perfusion still  present, dorsalis pedis pulse is noted bilaterally.  No swelling or redness noted.  Neuro -  MS: awake, alert.  Globally aphasic and not following commands.  Making a few guttural noises but no comprehensible speech CN 2-12: eyes spontanousely open, tracking bilaterally.   has globa aphasia, nonverbal, not following commands, not able to name and repeat and read. Left gaze preference but able to cross midline. Right facial droop.   Motor:  LUE and LLE purposeful movement against gravity. LUE able to hold with no drift. LLE can hold at knee flexion and foot on bed position. RUE and RLE no movement to pain stimulation, bilateral babinski positive.  Sensory: pain sensation in left arm/leg. Absent on right. coordination and gait unable to test due to aphasia.    Assessment and Plan   [name] is a 75 y.o. female w/pmh of AFib on Eliquis (off x 1 week for abdominal hematoma), COPD, remote tobacco abuse, hypertrophic cardiomyopathy, aortic stenosis, PAD who presents with right sided weakness, right facial droop, and left gaze preference. 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  MRI Brain revealed left MCA and ACA stroke.  CTA Head and Neck showed occlusion of the left MCA and an age indeterminate occlusion of the proximal right internal carotid artery in the neck with non-opacification through the carotid terminus.  Echo w/EF 60 to 65 %, left atrium severely dilated.  LDL 120  hemoglobin a1c 6.0.  Heparin IV transitioned to eliquis 11/18/20 Stable  # New right leg vascular stenosis: CTA shows possible distal right femoropopliteal bypass occlusion. Seen by vascular surgery. No intervention recommended.  Clinically better today with warm legs and dorsalis pedis pulse present b/l.  Con't to monitor.  #PAF Was on elqiuis, on hold PTA due to  abdominal hematoma Heparin IV transitioned to eliquis 11/18/20 Afib RVR - cardiology on board Converted to sinus 8/8 Now on po amiodarone  #Respiratory distress, improved - Was intubated due to respiratory distress - extubated to 4L on 11/14/20.  - wax and wane respiratory distress - overall getting better over time - CXR 11/18/20 - partial lung collapse s/p CPAP -> d/c -> repeat CXR 11/19/20 with improvement - CXR 8/10 - Cardiomegaly and evidence of small bilateral pleural effusions with atelectasis, improved since 11/19/2020.   #Hypertension Diltiazem 30 mg QD, Nebivolol 10 mg QD and Hydralazine 25 mg Q8 at home.  Currently blood pressure goal is normotensive.   metoprolol 25mg  bid hydralazine 25mg  Q8h BP stable on the high end  #R ICA stenosis per Dr. , IR showed severe pre occlusive stenosis of the proximal right internal carotid artery associated with a delayed string sign found during cerebral angiogram, patient may require NIR intervention for the Rt ICA stenosis in the future  Follow up with Dr. as outpt Avoid low BP   #Abd hematoma - reportedly developed after coughing 2 weeks prior to admission - Eliquis stopped in hospital in [place] - hematoma has greatly improved - tolerating IV heparin - switch back to eliquis 11/18/20  #Hyperlipidemia LDL goal is <  70.  LDL is 120 Allergic to statin meds Continue home zetia  Dysphagia  Currently NPO on NGT and TF @ 55 Speech following  Other stroke risk factors Advanced age Former smoker PAD  Other acute issue Leukocytosis - WBC 17.2->14.2->12.0->12.3->12.0->10.3->12.6->15.0   Hospital day # 16 Patient continues to be globally aphasic with dense right hemiplegia and having dysphagia.     The patient will likely not be able to swallow safely for quite some time hence she will benefit with PEG tube placement for long-term nutrition and her medication needs.  discussion with patient `s husband   at the  bedside and he is agreeable with PEG tube placement later this week.  have discontinued Eliquis and started IV heparin drip till 1 hour before PEG tube.  Greater than 50% time during this 25-minute visit was spent on counseling and coordination of care and discussion with care team and answering questions   Delia Heady, MD Neurology, Stroke Team. 11/29/2020 2:01 PM   To contact Stroke Continuity provider, please refer to [email or web address]. After hours, contact General Neurology

## 2020-11-29 NOTE — Progress Notes (Signed)
Pt declined CPAP. Doesn't appear to have worn one for several nights. No unit in room. Patient resting and doing well on 2 lpm nasal cannula. Feeding tube is also in place up patients nose.

## 2020-11-29 NOTE — Progress Notes (Signed)
Nutrition Follow-up  DOCUMENTATION CODES:   Severe malnutrition in context of chronic illness  INTERVENTION:  Continue TF via Cortrak tube: -Jevity 1.2 at 55 ml/h (1320 ml per day) -Prosource TF 45 ml BID -263m free water Q4H  Provides 1664 kcal, 95 gm protein, 1070 ml free water daily (22765mtotal free water with flushes)  NUTRITION DIAGNOSIS:   Severe Malnutrition related to chronic illness (COPD) as evidenced by severe muscle depletion, severe fat depletion. Ongoing.   GOAL:   Patient will meet greater than or equal to 90% of their needs Met with TF.   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   8/2 s/p thrombectomy with complete revascularization   8/3 cortrak placed; tip gastric   Pt providing no meaningful responses, doesn't follow commands. Pt continues to be NPO receiving TF via Cortrak. Tolerating TF without issues per RN. Surgery has been consulted for PEG placement. SLP continues to follow. Pt pending SNF placement as unable to participate in therapies to the extent required for CIR.    Current TF: Jevity 1.2 @ 5512mr, 74m85mosource TF BID, 200ml38me water Q6H  Medications reviewed and include: SSI, protonix Labs reviewed: Na 151 (H) CBGs: 139-135-126  UOP: 850ml 42mhours I/O: +11.2L since admit  Admission weight: 51.4 kg Current weight: 49.7 kg  Non-pitting edema noted to RUE and RLE per nursing edema assessment.  Diet Order:   Diet Order             Diet NPO time specified  Diet effective now                   EDUCATION NEEDS:   Not appropriate for education at this time  Skin:  Skin Assessment: Reviewed RN Assessment Skin Integrity Issues:: Stage  II Stage II: anus  Last BM:  8/16 type 6  Height:   Ht Readings from Last 1 Encounters:  11/13/20 5' 3"  (1.6 m)    Weight:   Wt Readings from Last 1 Encounters:  11/29/20 49.7 kg    BMI:  Body mass index is 19.41 kg/m.  Estimated Nutritional Needs:   Kcal:  1600-1800  Protein:  85-100 grams  Fluid:  >1.6 L/day    AmandaLarkin InaRD, LDN (she/her/hers) RD pager number and weekend/on-call pager number located in [email or web address]

## 2020-11-29 NOTE — NC FL2 (Signed)
MEDICAID FL2 LEVEL OF CARE SCREENING TOOL     IDENTIFICATION  Patient Name: Beverly Li Birthdate: [birth year] Sex: female Admission Date (Current Location): 11/13/2020  [facility] and [place] Number:  Producer, television/film/video and Address:  [facility], [address], [place], [place] [address]      Provider Number: [id number]  Attending Physician Name and Address:  Tyrone Nine, MD  Relative Name and Phone Number:       Current Level of Care: Hospital Recommended Level of Care: Skilled Nursing Facility Prior Approval Number:    Date Approved/Denied:   PASRR Number: [id number]  Discharge Plan: SNF    Current Diagnoses: Patient Active Problem List   Diagnosis Date Noted   Pressure injury of skin 11/23/2020   Protein-calorie malnutrition, severe 11/16/2020   Stroke (HCC) 11/13/2020   Middle cerebral artery embolism, left 11/13/2020   Healthcare maintenance 04/27/2020   Sinus bradycardia 12/25/2019   COPD (chronic obstructive pulmonary disease) (HCC) 12/16/2018   Cough 12/16/2018   Unintentional weight loss 10/05/2018   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   Visit for monitoring Tikosyn therapy 11/18/2017   Closed nondisplaced fracture of head of radius with routine healing 03/03/2017   Coronary artery disease involving native coronary artery of native heart without angina pectoris    Chronic diastolic heart failure (HCC) 03/28/2015   Paroxysmal atrial fibrillation (HCC)    Essential hypertension    S/P femoral-popliteal bypass surgery    Obstructive hypertrophic cardiomyopathy (HCC) 03/03/2015   PAD (peripheral artery disease) (HCC) 03/02/2015   Claudication (HCC) 01/31/2015   Former smoker 01/31/2015   Hyperlipidemia 01/31/2015   Atherosclerosis of native arteries of extremity with intermittent claudication (HCC) 02/10/2014   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   Atrial arrhythmia    Mild aortic sclerosis      Orientation RESPIRATION BLADDER Height & Weight     Self (follows commands)  O2 (Nasal cannula 4L) Incontinent, External catheter Weight: 109 lb 9.1 oz (49.7 kg) Height:  5\' 3"  (160 cm)  BEHAVIORAL SYMPTOMS/MOOD NEUROLOGICAL BOWEL NUTRITION STATUS      Incontinent Feeding tube (Jevity 1.2 , 55 mL/hr)  AMBULATORY STATUS COMMUNICATION OF NEEDS Skin   Extensive Assist Non-Verbally (currently mute) Normal                       Personal Care Assistance Level of Assistance  Bathing, Feeding, Dressing Bathing Assistance: Maximum assistance Feeding assistance: Maximum assistance Dressing Assistance: Maximum assistance     Functional Limitations Info  Speech     Speech Info: Impaired (mute)    SPECIAL CARE FACTORS FREQUENCY  PT (By licensed PT), OT (By licensed OT), Speech therapy     PT Frequency: 5x/week OT Frequency: 5x/week     Speech Therapy Frequency: 5x/week      Contractures Contractures Info: Not present    Additional Factors Info  Code Status, Allergies, Psychotropic, Insulin Sliding Scale Code Status Info: Full Allergies Info: Amoxicillin, Atenolol, Crestor (Rosuvastatin Calcium), Pravastatin, Sulfa Antibiotics, Codeine Psychotropic Info: Wellbutrin Insulin Sliding Scale Info: see dc summary       Current Medications (11/29/2020):  This is the current hospital active medication list Current Facility-Administered Medications  Medication Dose Route Frequency Provider Last Rate Last Admin   acetaminophen (TYLENOL) tablet 650 mg  650 mg Oral Q4H PRN Kirby-Graham, 12/01/2020, NP       Or   acetaminophen (TYLENOL) 160 MG/5ML solution 650 mg  650 mg  Per Tube Q4H PRN Leda Gauze, NP   650 mg at 11/23/20 1002   Or   acetaminophen (TYLENOL) suppository 650 mg  650 mg Rectal Q4H PRN Leda Gauze, NP   650 mg at 11/15/20 1128   albuterol (PROVENTIL) (2.5 MG/3ML) 0.083% nebulizer solution 2.5 mg  2.5 mg Nebulization Q6H PRN Norton Blizzard,  NP       [START ON 12/06/2020] amiodarone (PACERONE) tablet 200 mg  200 mg Per Tube Daily Marvel Plan, MD       amiodarone (PACERONE) tablet 400 mg  400 mg Per Tube BID Marvel Plan, MD   400 mg at 11/28/20 2148   arformoterol (BROVANA) nebulizer solution 15 mcg  15 mcg Nebulization BID Lorin Glass, MD   15 mcg at 11/29/20 0829   budesonide (PULMICORT) nebulizer solution 0.5 mg  0.5 mg Nebulization BID Selmer Dominion B, NP   0.5 mg at 11/29/20 0829   buPROPion (WELLBUTRIN) tablet 75 mg  75 mg Per Tube BID Micki Riley, MD   75 mg at 11/28/20 2149   chlorhexidine (PERIDEX) 0.12 % solution 15 mL  15 mL Mouth Rinse BID Cheri Fowler, MD   15 mL at 11/29/20 0858   diltiazem (CARDIZEM) tablet 30 mg  30 mg Per Tube Daily PRN Georgeann Oppenheim, RPH       ezetimibe (ZETIA) tablet 10 mg  10 mg Per Tube Daily Micki Riley, MD   10 mg at 11/28/20 1130   feeding supplement (JEVITY 1.2 CAL) liquid 1,000 mL  1,000 mL Per Tube Continuous Lynnell Catalan, MD 55 mL/hr at 11/29/20 0134 1,000 mL at 11/29/20 0134   feeding supplement (PROSource TF) liquid 45 mL  45 mL Per Tube BID Cheri Fowler, MD   45 mL at 11/28/20 2147   free water 200 mL  200 mL Per Tube Q6H Esaw Grandchild A, DO   200 mL at 11/29/20 0600   guaiFENesin (ROBITUSSIN) 100 MG/5ML solution 300 mg  15 mL Per Tube Q6H Selmer Dominion B, NP   300 mg at 11/29/20 0858   heparin ADULT infusion 100 units/mL (25000 units/280mL)  850 Units/hr Intravenous Continuous Norva Pavlov, RPH 7 mL/hr at 11/29/20 0800 700 Units/hr at 11/29/20 0800   hydrALAZINE (APRESOLINE) injection 20 mg  20 mg Intravenous Q6H PRN Merry Lofty, NP   20 mg at 11/27/20 0606   hydrALAZINE (APRESOLINE) tablet 50 mg  50 mg Per Tube BID Selmer Dominion B, NP   50 mg at 11/28/20 2148   insulin aspart (novoLOG) injection 0-9 Units  0-9 Units Subcutaneous Q4H Lorin Glass, MD   1 Units at 11/29/20 0858   MEDLINE mouth rinse  15 mL Mouth Rinse q12n4p Cheri Fowler, MD   15 mL  at 11/28/20 1618   metoprolol tartrate (LOPRESSOR) tablet 25 mg  25 mg Per Tube BID Barrett, Rhonda G, PA-C   25 mg at 11/28/20 2148   ondansetron (ZOFRAN) injection 4 mg  4 mg Intravenous Q6H PRN Leda Gauze, NP       pantoprazole sodium (PROTONIX) 40 mg/20 mL oral suspension 40 mg  40 mg Per Tube QHS Cheri Fowler, MD   40 mg at 11/28/20 2148   polyethylene glycol (MIRALAX / GLYCOLAX) packet 17 g  17 g Per Tube Daily Cheri Fowler, MD   17 g at 11/28/20 1131   revefenacin (YUPELRI) nebulizer solution 175 mcg  175 mcg Nebulization Daily Erick Blinks, MD  175 mcg at 11/29/20 7903     Discharge Medications: Please see discharge summary for a list of discharge medications.  Relevant Imaging Results:  Relevant Lab Results:   Additional Information SSN: [id number] [phone]. Pfizer COVID-19 Vaccine 09/18/2020, 01/27/2020 , 05/27/2019 , 05/06/2019. Even if not in network or would need to pay privately husband can do it.  Baldemar Lenis, LCSW

## 2020-11-29 NOTE — Progress Notes (Signed)
Physical Therapy Treatment Patient Details Name: Beverly Li MRN: [id number] DOB: Apr 02, 1946 Today's Date: 11/29/2020    History of Present Illness 75 y.o. female who presented 11/13/20 with R sided weakness and L gaze preference. Outside window for tPA. 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 S/p thrombectomy with TICI 3 revascularization 8/1. ETT 8/1-8/2. 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 PMH: AFib on Eliquis (off x 1 week), COPD, remote tobacco abuse, hypertrophic cardiomyopathy, aortic stenosis, PAD, anxiety, arthritis, Bell's palsy, HTN, osteopenia, and GERD    PT Comments    The pt was agreeable to session with focus on progressing OOB mobility and seated stability. The pt was able to demo improved functional activation of LLE and was able to consistently finish ROM exercises started by PT against gravity. The pt continues to require totalA to complete ROM with RLE at this time. The pt required maxA to complete rolling in bed, and made some attempts to remain in sidelying for pericare, but required totalA to complete transition to sitting EOB. The pt then required maxA to maintain static or dynamic sitting balance, as she continues to present with strong posterior lean and makes no attempt to self correct when support is reduced. The pt was moved to recliner with totalA and pivot technique, continues to be appropriate for SNF at d/c, will need 24/7 assist and lift given current mobility deficits.     Follow Up Recommendations  Supervision/Assistance - 24 hour;SNF     Equipment Recommendations  3in1 (PT);Hospital bed (lift)    Recommendations for Other Services       Precautions / Restrictions Precautions Precautions: Fall Precaution Comments: cortrak, R  hemiplegia Restrictions Weight Bearing Restrictions: No    Mobility  Bed Mobility Overal bed mobility: Needs Assistance Bed Mobility: Supine to Sit;Rolling Rolling: Max assist   Supine to sit: Total assist;HOB elevated     General bed mobility comments: pt needing assist to bring BLE to EOB, and then to elevate trunk. pt with immediate posterior LOB needing maxA to maintain.    Transfers Overall transfer level: Needs assistance Equipment used: None Transfers: Stand Pivot Transfers;Sit to/from Stand Sit to Stand: Max assist Stand pivot transfers: Max assist       General transfer comment: maxA to power up into standing, blocking bilateral knees. max cues to maintain trunk elevation and hip extension. max-totalA to pivot to recliner. suppoerted by PT  Ambulation/Gait             General Gait Details: Unable this date.   Stairs             Wheelchair Mobility    Modified Rankin (Stroke Patients Only) Modified Rankin (Stroke Patients Only) Pre-Morbid Rankin Score: No symptoms Modified Rankin: Severe disability     Balance Overall balance assessment: Needs assistance Sitting-balance support: Single extremity supported;Feet supported Sitting balance-Leahy Scale: Poor Sitting balance - Comments: Initial Max A for sitting balance and then able achieve sitting at EOB with Min Guard- Min A and LUE for support on bedrail Postural control: Right lateral lean;Posterior lean Standing balance support: Bilateral upper extremity supported;During functional activity Standing balance-Leahy Scale: Zero Standing balance comment: Max A, dependent on therapist for standing balance  Cognition Arousal/Alertness: Awake/alert Behavior During Therapy: Flat affect Overall Cognitive Status: Difficult to assess Area of Impairment: Following commands;Safety/judgement;Awareness;Problem solving                   Current Attention  Level: Focused   Following Commands: Follows one step commands inconsistently Safety/Judgement: Decreased awareness of safety Awareness: Intellectual Problem Solving: Slow processing General Comments: Making eye contact with therapist and sustaining it for 2-3 seconds without continued prompting, needing cues to attend to midline, and to attend to therapist while giving cues/commands. no change in response or facial expression with conversation. pt attemppting to mimic some of PT movements in regards to LLE exercises      Exercises General Exercises - Lower Extremity Ankle Circles/Pumps: AAROM;Left;PROM;Right;10 reps;Supine Heel Slides: AAROM;Left;PROM;Right;10 reps;Other reps (comment) Hip ABduction/ADduction: PROM;Both;10 reps;Supine Other Exercises Other Exercises: seated balance exercises leaning to L with propping on L elbow. pt needing max support and cues to lean forward with strong posterior lean and no efforts made to correct posterior LOB when support removed    General Comments General comments (skin integrity, edema, etc.): VSS on 3L, decreased from 4L with SpO2 99-100%      Pertinent Vitals/Pain Pain Assessment: Faces Faces Pain Scale: No hurt Pain Location: generalized Pain Descriptors / Indicators: Grimacing Pain Intervention(s): Monitored during session     PT Goals (current goals can now be found in the care plan section) Acute Rehab PT Goals Patient Stated Goal: spouse would like pt to go to [facility] PT Goal Formulation: With family Time For Goal Achievement: 12/13/20 Potential to Achieve Goals: Fair Progress towards PT goals: Progressing toward goals    Frequency    Min 3X/week      PT Plan Current plan remains appropriate       AM-PAC PT "6 Clicks" Mobility   Outcome Measure  Help needed turning from your back to your side while in a flat bed without using bedrails?: Total Help needed moving from lying on your back to sitting on the side of a  flat bed without using bedrails?: Total Help needed moving to and from a bed to a chair (including a wheelchair)?: Total Help needed standing up from a chair using your arms (e.g., wheelchair or bedside chair)?: Total Help needed to walk in hospital room?: Total Help needed climbing 3-5 steps with a railing? : Total 6 Click Score: 6    End of Session Equipment Utilized During Treatment: Oxygen;Gait belt Activity Tolerance: Patient limited by fatigue Patient left: with call bell/phone within reach;in bed;with bed alarm set;with family/visitor present Nurse Communication: Mobility status PT Visit Diagnosis: Muscle weakness (generalized) (M62.81);Difficulty in walking, not elsewhere classified (R26.2);Other symptoms and signs involving the nervous system (R29.898);Hemiplegia and hemiparesis Hemiplegia - Right/Left: Right Hemiplegia - dominant/non-dominant: Dominant Hemiplegia - caused by: Cerebral infarction     Time: 1410-3013 PT Time Calculation (min) (ACUTE ONLY): 52 min  Charges:  $Therapeutic Exercise: 23-37 mins $Therapeutic Activity: 23-37 mins                     Lazarus Gowda, PT, DPT   Acute Rehabilitation Department Pager #: [phone]   Ronnie Derby 11/29/2020, 5:01 PM

## 2020-11-29 NOTE — Progress Notes (Signed)
PROGRESS NOTE  Beverly Li  MHD:[id number] DOB: 04/09/1946 DOA: 11/13/2020 PCP: Clayborn Heron, MD   Brief Narrative: Beverly Li is a 75 y.o. female with a history of atrial fibrillation on eliquis (held in setting of abdominal hematoma), COPD, tobacco use, hypertrophic cardiomyopathy, aortic stenosis, and PAD who presented to this hospital 8/1 with right-sided weakness, facial droop, leftward gaze preference found to have left MCA stroke, underwent thrombectomy with TICI 3 flow after one pass to left M1 segment with persistence of severe symptoms including aphasia and dysphagia. Care was transferred from [facility] to Hospitalist team on 8/16. Tube feeds have been started and PEG placement is planned 8/19. SNF disposition is being pursued.  8/1 admitted, intubated, thrombectomy 8/2 Successful thrombectomy with complete revasculatization, TICI 3 flow. MRI with L ACA/MCA territory infarcts and multiple small punctate infarcts. Extubated late in day to [place]. 8/3 Afib on tele. Amio load + gtt started. Cortrak placement. 8/4 Slow improvement in neuro status, following more commands (LUE/LLE), tracking consistently, working with PT/OT/SLP. 8/5 patient converted to sinus rhythm, cardiology is following on amiodarone infusion 8/6 Patient remained in sinus bradycardia, cardiology recommended stopping beta-blocker and switching amiodarone to p.o 8/7 Started with acute urinary retention, requiring straight cath twice. Afib with RVR. CXR with LLL collapse. Increased O2 requirement, could not tolerate BiPAP. Transitioned to [place] 30L/30% FiO2. 8/8 Improving respiratory status, still volume up, working with therapies. 8/9 Weaned down to nasal cannula, tolerating well. Moving LUE/LLE purposefully. Following commands. Remains globally aphasic. Working with PT/OT. BNP 1200, additional diuresis today. CXR with atelectasis. 8/10 Desaturations overnight/early AM to 81%. 3L [place] -> HFNC -> Venturi mask. CXR with  small bilateral pleural effusions and atelectasis, improved from prior. Suspect desats mainly positional. Lasix 40mg  IV x 1 given. 8/11 diuresis stopped 2/2 contraction alkalosis   Assessment & Plan: Active Problems:   Obstructive hypertrophic cardiomyopathy (HCC)   Stroke (HCC)   Middle cerebral artery embolism, left   Protein-calorie malnutrition, severe   Pressure injury of skin  Left ACA and MCA territory infarcts with scattered punctate right frontoparietal cortical and right cerebellar infarcts: s/p MCA thrombectomy with TICI3. Embolic during anticoagulation pause (due to abdominal hematoma).  - Neurology has been following the patient, continuing anticoagulation - SNF disposition pursued due to continued deficits. Would not be able to participate for required duration in CIR.   Dysphagia: Severe, requiring artifical nutrition.  - PEG to be placed by surgery after 5 day washout of eliquis. Continuing anticoagulation with IV heparin up until a few hours before procedure.  In setting of Eliquis be   PAF with RVR: Converted to NSR 8/8.  - Continue amiodarone load as ordered.  - Now with sinus bradycardia and occasional junctional beats on my personal review of telemetry. Will decrease dose of metoprolol, discussed hold parameters with RN (for HR < 55 bpm) - Restart eliquis when able  Hypernatremia:  - Continue free water 200cc q6h, monitor BMP.    Leukocytosis: Persistent without infectious nidus. May be from recurrent silent aspiration. Afebrile.  - Continue monitoring off abx, low threshold to repeat CXR.     Right femoropopliteal bypass graft stenosis: Patent per vascular surgery.  - No change to management recommended, Dr. 10/11 to arrange follow up as outpatient in 6 months.     Hypertrophic cardiomyopathy, HTN:  - Continue hydralazine and metoprolol.   Acute hypoxic respiratory failure: s/p ETT (extubated 11/14/20)  - Continue weaning efforts.    Right ICA stenosis:  Delayed  string sign on cerebral angiogram.  - Follow up with Dr. Corliss Skains as outpatient.  - Avoid hypotension.     Hyperlipidemia:  - Continue zetia (myalgias with multiple statins). - LDL is 120, recommend lipid clinic follow up.   Severe protein calorie malnutrition:  - Tube feeds as above.   Abdominal hematoma: Improved greatly. Developed due to severe coughing due to pneumonia.   DVT prophylaxis: Heparin IV Code Status: Full Family Communication: None at bedside Disposition Plan:  Status is: Inpatient  Remains inpatient appropriate because:Unsafe d/c plan and Inpatient level of care appropriate due to severity of illness  Dispo: The patient is from: Home              Anticipated d/c is to: SNF              Patient currently is not medically stable to d/c.   Difficult to place patient No  Consultants:  PCCM Neurology NeuroIR Cardiology  Antimicrobials: None   Subjective: Aphasic, doesn't consistently follow commands. No overnight events reported by RN. Husband not at bedside at this time but was worried about bradycardia. No pauses or symptoms.  Objective: Vitals:   11/29/20 0829 11/29/20 0958 11/29/20 1130 11/29/20 1539  BP:   140/76 122/61  Pulse:   (!) 59 (!) 49  Resp:   18 18  Temp:   97.8 F (36.6 C) 98.2 F (36.8 C)  TempSrc:   Axillary Axillary  SpO2: 99% 100% 96%   Weight:      Height:        Intake/Output Summary (Last 24 hours) at 11/29/2020 1545 Last data filed at 11/29/2020 1132 Gross per 24 hour  Intake 2097.02 ml  Output 850 ml  Net 1247.02 ml   Filed Weights   11/25/20 0400 11/28/20 0500 11/29/20 0500  Weight: 48.3 kg 49.5 kg 49.7 kg    Gen: 75 y.o. female in no distress Pulm: Non-labored breathing [place] O2. Clear to auscultation bilaterally/anteriorly. CV: Regular rate and rhythm. No murmur, rub, or gallop. No JVD, no pitting pedal edema. GI: Abdomen soft, non-tender, non-distended, with normoactive bowel sounds. No organomegaly or  masses felt. Ext: Warm, dry. Dense right hemiparesis but also minimal volitional movement on left.  Skin: No rashes, lesions or ulcers Neuro: Alert, not cooperative with exam. Leftward gaze preference but tracks past midline. Psych: Calm, UTD.  Data Reviewed: I have personally reviewed following labs and imaging studies  CBC: Recent Labs  Lab 11/24/20 0351 11/25/20 0038 11/26/20 0223 11/27/20 0142 11/28/20 0045 11/29/20 0128  WBC 15.0* 16.7* 17.4* 17.5* 16.9* 14.0*  NEUTROABS 12.9*  --   --   --   --   --   HGB 13.8 12.6 13.3 13.0 12.7 12.7  HCT 44.8 41.7 44.0 41.6 41.9 42.6  MCV 101.6* 101.5* 101.9* 99.8 101.2* 102.9*  PLT 449* 428* 434* 419* 403* 388   Basic Metabolic Panel: Recent Labs  Lab 11/24/20 0351 11/25/20 0038 11/26/20 0223 11/27/20 0142 11/28/20 0045 11/29/20 0128  NA 146* 146* 149* 150* 150* 151*  K 3.7 4.4 4.5 4.1 4.3 4.4  CL 98 105 108 112* 115* 113*  CO2 40* 34* 31 32 27 30  GLUCOSE 124* 122* 119* 125* 95 140*  BUN 46* 50* 52* 59* 60* 60*  CREATININE 0.81 0.80 0.82 0.88 0.81 0.75  CALCIUM 9.2 9.0 9.2 9.1 8.9 8.9  MG 2.4  --   --   --   --  2.3  PHOS  --   --   --   --   --  3.4   GFR: Estimated Creatinine Clearance: 47.7 mL/min (by C-G formula based on SCr of 0.75 mg/dL). Liver Function Tests: No results for input(s): AST, ALT, ALKPHOS, BILITOT, PROT, ALBUMIN in the last 168 hours. No results for input(s): LIPASE, AMYLASE in the last 168 hours. No results for input(s): AMMONIA in the last 168 hours. Coagulation Profile: No results for input(s): INR, PROTIME in the last 168 hours. Cardiac Enzymes: No results for input(s): CKTOTAL, CKMB, CKMBINDEX, TROPONINI in the last 168 hours. BNP (last 3 results) No results for input(s): PROBNP in the last 8760 hours. HbA1C: No results for input(s): HGBA1C in the last 72 hours. CBG: Recent Labs  Lab 11/29/20 0007 11/29/20 0401 11/29/20 0808 11/29/20 1125 11/29/20 1537  GLUCAP 160* 139* 135* 126*  133*   Lipid Profile: No results for input(s): CHOL, HDL, LDLCALC, TRIG, CHOLHDL, LDLDIRECT in the last 72 hours. Thyroid Function Tests: No results for input(s): TSH, T4TOTAL, FREET4, T3FREE, THYROIDAB in the last 72 hours. Anemia Panel: No results for input(s): VITAMINB12, FOLATE, FERRITIN, TIBC, IRON, RETICCTPCT in the last 72 hours. Urine analysis:    Component Value Date/Time   COLORURINE YELLOW 11/13/2020 1728   APPEARANCEUR CLEAR 11/13/2020 1728   LABSPEC 1.045 (H) 11/13/2020 1728   PHURINE 7.0 11/13/2020 1728   GLUCOSEU NEGATIVE 11/13/2020 1728   HGBUR SMALL (A) 11/13/2020 1728   BILIRUBINUR NEGATIVE 11/13/2020 1728   KETONESUR 5 (A) 11/13/2020 1728   PROTEINUR NEGATIVE 11/13/2020 1728   UROBILINOGEN 1.0 02/27/2015 1127   NITRITE NEGATIVE 11/13/2020 1728   LEUKOCYTESUR NEGATIVE 11/13/2020 1728   No results found for this or any previous visit (from the past 240 hour(s)).    Radiology Studies: No results found.  Scheduled Meds:  [START ON 12/06/2020] amiodarone  200 mg Per Tube Daily   amiodarone  400 mg Per Tube BID   arformoterol  15 mcg Nebulization BID   budesonide (PULMICORT) nebulizer solution  0.5 mg Nebulization BID   buPROPion  75 mg Per Tube BID   chlorhexidine  15 mL Mouth Rinse BID   ezetimibe  10 mg Per Tube Daily   feeding supplement (PROSource TF)  45 mL Per Tube BID   free water  200 mL Per Tube Q4H   guaiFENesin  15 mL Per Tube Q6H   hydrALAZINE  50 mg Per Tube BID   insulin aspart  0-9 Units Subcutaneous Q4H   mouth rinse  15 mL Mouth Rinse q12n4p   metoprolol tartrate  12.5 mg Per Tube BID   pantoprazole sodium  40 mg Per Tube QHS   revefenacin  175 mcg Nebulization Daily   Continuous Infusions:  feeding supplement (JEVITY 1.2 CAL) 55 mL/hr at 11/29/20 1132   heparin 850 Units/hr (11/29/20 1132)     LOS: 16 days   Time spent: 35 minutes.  Tyrone Nine, MD [facility] [email or web address] 11/29/2020, 3:45 PM

## 2020-11-29 NOTE — Progress Notes (Signed)
ANTICOAGULATION CONSULT NOTE - f/u Consult  Pharmacy Consult for Heparin Indication: h/o Afib, recent CVA, PVD  Allergies  Allergen Reactions   Amoxicillin Other (See Comments)    UTI Has patient had a PCN reaction causing immediate rash, facial/tongue/throat swelling, SOB or lightheadedness with hypotension: No Has patient had a PCN reaction causing severe rash involving mucus membranes or skin necrosis: No Has patient had a PCN reaction that required hospitalization: No Has patient had a PCN reaction occurring within the last 10 years: Yes--UTI ONLY If all of the above answers are "NO", then may proceed with Cephalosporin use.    Atenolol Cough   Crestor [Rosuvastatin Calcium] Other (See Comments)    Muscle aches   Pravastatin Other (See Comments)    Muscle aches   Sulfa Antibiotics Nausea Only   Codeine     hallucinations    Patient Measurements: Height: 5\' 3"  (160 cm) Weight: 49.7 kg (109 lb 9.1 oz) IBW/kg (Calculated) : 52.4 Heparin Dosing Weight: 49.5 kg  Vital Signs: Temp: 98 F (36.7 C) (08/17 0811) Temp Source: Oral (08/17 0811) BP: 142/73 (08/17 0811) Pulse Rate: 55 (08/17 0811)  Labs: Recent Labs    11/27/20 0142 11/28/20 0045 11/29/20 0128 11/29/20 0853  HGB 13.0 12.7 12.7  --   HCT 41.6 41.9 42.6  --   PLT 419* 403* 388  --   APTT  --   --  34 38*  HEPARINUNFRC  --   --  >1.10*  --   CREATININE 0.88 0.81 0.75  --      Estimated Creatinine Clearance: 47.7 mL/min (by C-G formula based on SCr of 0.75 mg/dL).   Medical History:   Assessment: Anticoag: Eliquis PTA for Afib (held x 1 week for recent hematoma) > new stroke. - she does have an abdominal wall hematoma secondary to coughing PTA -Apixaban resumed 8/10>8/16. Back to IV heparin for PEG tube. Hgb 12.7 WNL. Plts 403 WNL. - aPTT 34>38 unexpectedly low. HL>1.1 due to drug/lab interaction with Eliquis.  Goal of Therapy:  Heparin level 0.3-0.5 units/ml Monitor platelets by anticoagulation  protocol: Yes   Plan:  Plan for PEG 8/19 Increase IV heparin (no bolus) to 850 units/hr Recheck in 6 hrs. Daily HL, aPTT, and CBC.   Javaughn Opdahl S. 9/19, PharmD, BCPS Clinical Staff Pharmacist [email or web address], Merilynn Finland 11/29/2020,10:30 AM

## 2020-11-29 NOTE — Plan of Care (Signed)
  Problem: Nutrition: Goal: Risk of aspiration will decrease Outcome: Progressing   Problem: Ischemic Stroke/TIA Tissue Perfusion: Goal: Complications of ischemic stroke/TIA will be minimized Outcome: Progressing   Problem: Clinical Measurements: Goal: Ability to maintain clinical measurements within normal limits will improve Outcome: Progressing Goal: Will remain free from infection Outcome: Progressing Goal: Diagnostic test results will improve Outcome: Progressing Goal: Respiratory complications will improve Outcome: Progressing Goal: Cardiovascular complication will be avoided Outcome: Progressing   Problem: Nutrition: Goal: Adequate nutrition will be maintained Outcome: Progressing   Problem: Coping: Goal: Level of anxiety will decrease Outcome: Progressing   Problem: Elimination: Goal: Will not experience complications related to bowel motility Outcome: Progressing Goal: Will not experience complications related to urinary retention Outcome: Progressing   Problem: Pain Managment: Goal: General experience of comfort will improve Outcome: Progressing   Problem: Safety: Goal: Ability to remain free from injury will improve Outcome: Progressing   Problem: Skin Integrity: Goal: Risk for impaired skin integrity will decrease Outcome: Progressing

## 2020-11-30 ENCOUNTER — Inpatient Hospital Stay (HOSPITAL_COMMUNITY): Payer: Medicare Other

## 2020-11-30 DIAGNOSIS — R569 Unspecified convulsions: Secondary | ICD-10-CM

## 2020-11-30 DIAGNOSIS — I6602 Occlusion and stenosis of left middle cerebral artery: Secondary | ICD-10-CM | POA: Diagnosis not present

## 2020-11-30 DIAGNOSIS — I63512 Cerebral infarction due to unspecified occlusion or stenosis of left middle cerebral artery: Secondary | ICD-10-CM | POA: Diagnosis not present

## 2020-11-30 LAB — BASIC METABOLIC PANEL
Anion gap: 4 — ABNORMAL LOW (ref 5–15)
BUN: 57 mg/dL — ABNORMAL HIGH (ref 8–23)
CO2: 32 mmol/L (ref 22–32)
Calcium: 8.5 mg/dL — ABNORMAL LOW (ref 8.9–10.3)
Chloride: 113 mmol/L — ABNORMAL HIGH (ref 98–111)
Creatinine, Ser: 0.8 mg/dL (ref 0.44–1.00)
GFR, Estimated: >60 mL/min (ref >60)
Glucose, Bld: 110 mg/dL — ABNORMAL HIGH (ref 70–99)
Potassium: 3.8 mmol/L (ref 3.5–5.1)
Sodium: 149 mmol/L — ABNORMAL HIGH (ref 135–145)

## 2020-11-30 LAB — CBC
HCT: 39.6 % (ref 36.0–46.0)
Hemoglobin: 11.9 g/dL — ABNORMAL LOW (ref 12.0–15.0)
MCH: 30.6 pg (ref 26.0–34.0)
MCHC: 30.1 g/dL (ref 30.0–36.0)
MCV: 101.8 fL — ABNORMAL HIGH (ref 80.0–100.0)
Platelets: 338 10*3/uL (ref 150–400)
RBC: 3.89 MIL/uL (ref 3.87–5.11)
RDW: 17.8 % — ABNORMAL HIGH (ref 11.5–15.5)
WBC: 14.2 10*3/uL — ABNORMAL HIGH (ref 4.0–10.5)
nRBC: 0 % (ref 0.0–0.2)

## 2020-11-30 LAB — GLUCOSE, CAPILLARY
Glucose-Capillary: 163 mg/dL — ABNORMAL HIGH (ref 70–99)
Glucose-Capillary: 105 mg/dL — ABNORMAL HIGH (ref 70–99)
Glucose-Capillary: 143 mg/dL — ABNORMAL HIGH (ref 70–99)
Glucose-Capillary: 104 mg/dL — ABNORMAL HIGH (ref 70–99)
Glucose-Capillary: 148 mg/dL — ABNORMAL HIGH (ref 70–99)

## 2020-11-30 LAB — APTT
aPTT: 67 seconds — ABNORMAL HIGH (ref 24–36)
aPTT: 80 seconds — ABNORMAL HIGH (ref 24–36)

## 2020-11-30 LAB — HEPARIN LEVEL (UNFRACTIONATED)
Heparin Unfractionated: >1.1 IU/mL — ABNORMAL HIGH (ref 0.30–0.70)
Heparin Unfractionated: >1.1 IU/mL — ABNORMAL HIGH (ref 0.30–0.70)

## 2020-11-30 NOTE — Progress Notes (Signed)
EEG complete - results pending 

## 2020-11-30 NOTE — Progress Notes (Addendum)
PROGRESS NOTE    Beverly Li  HAL:[id number] DOB: [birth year] DOA: 11/13/2020 PCP: Clayborn Heron, MD   Chief Complain: Right-sided weakness, facial droop  Brief Narrative: Patient is a 75 year old female with history of A. fib, COPD, tobacco abuse, hypertrophic cardiomyopathy, aortic stenosis, peripheral artery disease who initially presented here on 8/7 with right-sided weakness, facial droop, left gaze preference.  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  Neurology/pccm  was consulted.  She had to be intubated on presentation.  She underwent thrombectomy with TICI 3 flow to left M1 segment.  Hospital course was remarkable for persistent aphasia, dysphagia.  She was transferred from [facility] service to hospitalist team on 8/16.  She was started on tube feeding for persistent dysphagia.  Plan for PEG placement on 8/19.  Eventually plan is to get discharged to skilled nursing facility.  Important events:  8/1 admitted, intubated, thrombectomy 8/2 Successful thrombectomy with complete revasculatization, TICI 3 flow. MRI with L ACA/MCA territory infarcts and multiple small punctate infarcts. Extubated late in day to [place]. 8/3 Afib on tele. Amio load + gtt started. Cortrak placement. 8/4 Slow improvement in neuro status, following more commands (LUE/LLE), tracking consistently, working with PT/OT/SLP. 8/5 patient converted to sinus rhythm, cardiology is following on amiodarone infusion 8/6 Patient remained in sinus bradycardia, cardiology recommended stopping beta-blocker and switching amiodarone to p.o 8/7 Started with acute urinary retention, requiring straight cath twice. Afib with RVR. CXR with LLL collapse. Increased O2 requirement, could not tolerate BiPAP. Transitioned to [facility] 30L/30% FiO2. 8/8 Improving respiratory status, still volume up, working with therapies. 8/9 Weaned down to nasal cannula, tolerating well. Moving LUE/LLE purposefully. Following commands. Remains globally aphasic.  Working with PT/OT. BNP 1200, additional diuresis today. CXR with atelectasis. 8/10 Desaturations overnight/early AM to 81%. 3L [place] -> HFNC -> Venturi mask. CXR with small bilateral pleural effusions and atelectasis, improved from prior. Suspect desats mainly positional. Lasix 40mg  IV x 1 given. 8/11 diuresis stopped 2/2 contraction alkalosis     Assessment & Plan:   Active Problems:   Obstructive hypertrophic cardiomyopathy (HCC)   Stroke (HCC)   Middle cerebral artery embolism, left   Protein-calorie malnutrition, severe   Pressure injury of skin   Left ACA/MCA territory infarcts with scattered punctuate right frontoparietal cortical and right cerebellar infarcts: Neurology was consulted and following.  Status post MCA thrombectomy with TICI 3.  Stroke was suspected to be embolic. She was taking Eliquis for anticoagulation for A. fib  at home but it was held due to abdominal hematoma.  Echo showed ejection fraction of 60 to 65%, severely dilated left atrium.  LDL of 120.  Hemoglobin A1 C of 6. She is globally aphasic, has dense right hemiplegia and dysphagia. 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  Noted to have delayed string sign on cerebral angiogram.  Follow-up with IR as an outpatient recommended.  Acute hypoxic respiratory failure: Developed acute respiratory failure after CT scan, was bagged by ED physician on the way to IR for thrombectomy.  Patient was intubated and put on ventilator in IR.  Was on PCCM service.  Currently extubated.  On 2 L of oxygen per minute.  Continue to try to wean the oxygen.  Dysphagia/aphasia: Secondary to stroke.  Currently has a feeding tube.  Plan is to put a PEG tube by surgical team on 8/19 after Eliquis washout.  Continue heparin drip for now.  Paroxysmal A. fib with RVR: Converted to normal sinus rhythm on 8/8.  She was started on amiodarone.  Currently in sinus bradycardia with occasional junctional beat.  On metoprolol for rate control.   Currently on anticoagulation with heparin, plan is to restart Eliquis.  She was previously taking Eliquis but was held be due to abdominal hematoma.  Hypernatremia: Continue free water.  Monitor BMP  Leukocytosis: Continue monitoring.  No clear signs of infection  Right femoropopliteal bypass graft stenosis: Vascular surgery was following.  Recommend outpatient follow-up.  Hypertension: Continue to monitor blood pressure.  On hydralazine and metoprolol.  Hyperlipidemia: Continue Zetia.  LDL of 120.  Abdominal hematoma: Reportedly developed after coughing 2 weeks prior to admission..Significantly improved.  Debility/deconditioning: PT/OT recommended skilled nursing facility on discharge.  TOC following  Addendum: I was notified by OT that she had an episode suspicious for seizure-like activity that lasted 30 seconds.  We are ordering EEG    Nutrition Problem: Severe Malnutrition Etiology: chronic illness (COPD)      DVT prophylaxis:Heparin IV Code Status: Full Family Communication: Husband and daughter in law at bedside Status is: Inpatient  Remains inpatient appropriate because:Inpatient level of care appropriate due to severity of illness  Dispo: The patient is from: Home              Anticipated d/c is to: SNF              Patient currently is not medically stable to d/c.   Difficult to place patient No   Consultants: Neurology, IR, PCCM  Procedures: As above  Antimicrobials:  Anti-infectives (From admission, onward)    Start     Dose/Rate Route Frequency Ordered Stop   11/13/20 1423  ceFAZolin (ANCEF) 2-4 GM/100ML-% IVPB       Note to Pharmacy: Beverly Li, Beverly Li: cabinet override      11/13/20 1423 11/14/20 0229       Subjective:  Patient seen and examined at the bedside this morning.  Hemodynamically stable during my evaluation.  Not in distress but severely deconditioned, weak, lying on bed with feeding tube.  Looks malnourished  Objective: Vitals:    11/29/20 2024 11/30/20 0049 11/30/20 0425 11/30/20 0431  BP: (!) 142/71  (!) 157/69   Pulse: (!) 50     Resp: 20 20    Temp: 97.7 F (36.5 C) 98 F (36.7 C) 98.3 F (36.8 C)   TempSrc: Oral Oral Axillary   SpO2: 97%     Weight:    52.2 kg  Height:        Intake/Output Summary (Last 24 hours) at 11/30/2020 0809 Last data filed at 11/29/2020 2340 Gross per 24 hour  Intake 2135.07 ml  Output 800 ml  Net 1335.07 ml   Filed Weights   11/28/20 0500 11/29/20 0500 11/30/20 0431  Weight: 49.5 kg 49.7 kg 52.2 kg    Examination:  General exam:very deconditioned, ill looking, weak, lying in bed HEENT: Feeding tube Respiratory system:  no wheezes or crackles  Cardiovascular system: S1 & S2 heard, RRR.  Gastrointestinal system: Abdomen is nondistended, soft and nontender. Central nervous system: Awake but not alert or oriented, dense right hemiplegia, aphasia Extremities: No edema, no clubbing ,no cyanosis Skin: No rashes, no ulcers,no icterus      Data Reviewed: I have personally reviewed following labs and imaging studies  CBC: Recent Labs  Lab 11/24/20 0351 11/25/20 0038 11/26/20 0223 11/27/20 0142 11/28/20 0045 11/29/20 0128 11/30/20 0226  WBC 15.0*   < > 17.4* 17.5* 16.9* 14.0* 14.2*  NEUTROABS 12.9*  --   --   --   --   --   --  HGB 13.8   < > 13.3 13.0 12.7 12.7 11.9*  HCT 44.8   < > 44.0 41.6 41.9 42.6 39.6  MCV 101.6*   < > 101.9* 99.8 101.2* 102.9* 101.8*  PLT 449*   < > 434* 419* 403* 388 338   < > = values in this interval not displayed.   Basic Metabolic Panel: Recent Labs  Lab 11/24/20 0351 11/25/20 0038 11/26/20 0223 11/27/20 0142 11/28/20 0045 11/29/20 0128  NA 146* 146* 149* 150* 150* 151*  K 3.7 4.4 4.5 4.1 4.3 4.4  CL 98 105 108 112* 115* 113*  CO2 40* 34* 31 32 27 30  GLUCOSE 124* 122* 119* 125* 95 140*  BUN 46* 50* 52* 59* 60* 60*  CREATININE 0.81 0.80 0.82 0.88 0.81 0.75  CALCIUM 9.2 9.0 9.2 9.1 8.9 8.9  MG 2.4  --   --   --   --   2.3  PHOS  --   --   --   --   --  3.4   GFR: Estimated Creatinine Clearance: 50.1 mL/min (by C-G formula based on SCr of 0.75 mg/dL). Liver Function Tests: No results for input(s): AST, ALT, ALKPHOS, BILITOT, PROT, ALBUMIN in the last 168 hours. No results for input(s): LIPASE, AMYLASE in the last 168 hours. No results for input(s): AMMONIA in the last 168 hours. Coagulation Profile: No results for input(s): INR, PROTIME in the last 168 hours. Cardiac Enzymes: No results for input(s): CKTOTAL, CKMB, CKMBINDEX, TROPONINI in the last 168 hours. BNP (last 3 results) No results for input(s): PROBNP in the last 8760 hours. HbA1C: No results for input(s): HGBA1C in the last 72 hours. CBG: Recent Labs  Lab 11/29/20 1537 11/29/20 1734 11/29/20 2023 11/29/20 2349 11/30/20 0631  GLUCAP 133* 137* 151* 130* 163*   Lipid Profile: No results for input(s): CHOL, HDL, LDLCALC, TRIG, CHOLHDL, LDLDIRECT in the last 72 hours. Thyroid Function Tests: No results for input(s): TSH, T4TOTAL, FREET4, T3FREE, THYROIDAB in the last 72 hours. Anemia Panel: No results for input(s): VITAMINB12, FOLATE, FERRITIN, TIBC, IRON, RETICCTPCT in the last 72 hours. Sepsis Labs: No results for input(s): PROCALCITON, LATICACIDVEN in the last 168 hours.  No results found for this or any previous visit (from the past 240 hour(s)).       Radiology Studies: No results found.      Scheduled Meds:  [START ON 12/06/2020] amiodarone  200 mg Per Tube Daily   amiodarone  400 mg Per Tube BID   arformoterol  15 mcg Nebulization BID   budesonide (PULMICORT) nebulizer solution  0.5 mg Nebulization BID   buPROPion  75 mg Per Tube BID   chlorhexidine  15 mL Mouth Rinse BID   ezetimibe  10 mg Per Tube Daily   feeding supplement (PROSource TF)  45 mL Per Tube BID   free water  200 mL Per Tube Q4H   guaiFENesin  15 mL Per Tube Q6H   hydrALAZINE  50 mg Per Tube BID   insulin aspart  0-9 Units Subcutaneous Q4H    mouth rinse  15 mL Mouth Rinse q12n4p   metoprolol tartrate  12.5 mg Per Tube BID   pantoprazole sodium  40 mg Per Tube QHS   revefenacin  175 mcg Nebulization Daily   Continuous Infusions:  feeding supplement (JEVITY 1.2 CAL) 1,000 mL (11/30/20 0137)   heparin 950 Units/hr (11/30/20 0621)     LOS: 17 days    Time spent:35 mins. More than 50% of  that time was spent in counseling and/or coordination of care.      Burnadette Pop, MD [facility] P8/18/2022, 8:09 AM

## 2020-11-30 NOTE — Procedures (Signed)
Patient Name: Beverly Li  MRN: [id number]  Epilepsy Attending: [name]  Referring Physician/Provider: [name] Date: 11/30/2020 Duration: 21.29 mins  Patient history: 62 old female with left ACA/MCA infarct.  Had a seizure-like episode lasting about 30 seconds.  EEG to evaluate for seizures.  Level of alertness: Awake  AEDs during EEG study: None  Technical aspects: This EEG study was done with scalp electrodes positioned according to the 10-20 International system of electrode placement. Electrical activity was acquired at a sampling rate of 500Hz  and reviewed with a high frequency filter of 70Hz  and a low frequency filter of 1Hz . EEG data were recorded continuously and digitally stored.   Description: The posterior dominant rhythm consists of 8 Hz activity of moderate voltage (25-35 uV) seen predominantly in posterior head regions, symmetric and reactive to eye opening and eye closing. EEG showed continuous 5 to 6 Hz theta as well as intermittent 2 to 3 Hz delta slowing in left hemisphere.  Hyperventilation and photic stimulation were not performed.     ABNORMALITY - Continuous slow, left hemisphere  IMPRESSION: This study is suggestive of cortical dysfunction in left hemisphere, likely secondary to underlying strokes.  No seizures or definite epileptiform discharges were seen throughout the recording.  If suspicion for ictal-interictal activity remains a concern, a prolonged study can be considered.   [name] 

## 2020-11-30 NOTE — Progress Notes (Addendum)
ANTICOAGULATION CONSULT NOTE - f/u Consult  Pharmacy Consult for Heparin Indication: h/o Afib, recent CVA, PVD  Allergies  Allergen Reactions   Amoxicillin Other (See Comments)    UTI Has patient had a PCN reaction causing immediate rash, facial/tongue/throat swelling, SOB or lightheadedness with hypotension: No Has patient had a PCN reaction causing severe rash involving mucus membranes or skin necrosis: No Has patient had a PCN reaction that required hospitalization: No Has patient had a PCN reaction occurring within the last 10 years: Yes--UTI ONLY If all of the above answers are "NO", then may proceed with Cephalosporin use.    Atenolol Cough   Crestor [Rosuvastatin Calcium] Other (See Comments)    Muscle aches   Pravastatin Other (See Comments)    Muscle aches   Sulfa Antibiotics Nausea Only   Codeine     hallucinations    Patient Measurements: Height: 5\' 3"  (160 cm) Weight: 52.2 kg (115 lb 1.3 oz) IBW/kg (Calculated) : 52.4 Heparin Dosing Weight: 49.5 kg  Vital Signs: Temp: 97.6 F (36.4 C) (08/18 1102) Temp Source: Oral (08/18 1102) BP: 136/78 (08/18 1102) Pulse Rate: 59 (08/18 1102)  Labs: Recent Labs    11/28/20 0045 11/29/20 0128 11/29/20 0853 11/29/20 1653 11/30/20 0226 11/30/20 0826 11/30/20 1301  HGB 12.7 12.7  --   --  11.9*  --   --   HCT 41.9 42.6  --   --  39.6  --   --   PLT 403* 388  --   --  338  --   --   APTT  --  34   < > 46* 67*  --  80*  HEPARINUNFRC  --  >1.10*  --   --  >1.10*  --  >1.10*  CREATININE 0.81 0.75  --   --   --  0.80  --    < > = values in this interval not displayed.     Estimated Creatinine Clearance: 50.1 mL/min (by C-G formula based on SCr of 0.8 mg/dL).   Medical History:   Assessment: Anticoag: Eliquis PTA for Afib (held x 1 week for recent hematoma) > new stroke. - she does have an abdominal wall hematoma secondary to coughing PTA -Apixaban resumed 8/10>8/16. Back to IV heparin for PEG tube. Hgb 11.9  down. Plts 338 - aPTT 80 remains in goal rang . HL>1.1 due to drug/lab interaction with Eliquis.  Goal of Therapy:  aPTT 66-85 Heparin level 0.3-0.5 units/ml Monitor platelets by anticoagulation protocol: Yes   Plan:  Plan for PEG 8/19. Stop heparin 0500 Con't IV heparin (no bolus) to 950 units/hr Daily HL, aPTT, and CBC.   Ardella Chhim S. 9/19, PharmD, BCPS Clinical Staff Pharmacist [email or web address] Merilynn Finland Stillinger 11/30/2020,2:26 PM

## 2020-11-30 NOTE — Progress Notes (Signed)
ANTICOAGULATION CONSULT NOTE - Follow-Up Consult  Pharmacy Consult for IV Heparin Indication: Hx Afib, recent CVA, PVD  Allergies  Allergen Reactions   Amoxicillin Other (See Comments)    UTI Has patient had a PCN reaction causing immediate rash, facial/tongue/throat swelling, SOB or lightheadedness with hypotension: No Has patient had a PCN reaction causing severe rash involving mucus membranes or skin necrosis: No Has patient had a PCN reaction that required hospitalization: No Has patient had a PCN reaction occurring within the last 10 years: Yes--UTI ONLY If all of the above answers are "NO", then may proceed with Cephalosporin use.    Atenolol Cough   Crestor [Rosuvastatin Calcium] Other (See Comments)    Muscle aches   Pravastatin Other (See Comments)    Muscle aches   Sulfa Antibiotics Nausea Only   Codeine     hallucinations    Patient Measurements: Height: 5\' 3"  (160 cm) Weight: 49.7 kg (109 lb 9.1 oz) IBW/kg (Calculated) : 52.4 Heparin Dosing Weight: 49.5 kg  Vital Signs: Temp: 98 F (36.7 C) (08/18 0049) Temp Source: Oral (08/18 0049) BP: 142/71 (08/17 2024) Pulse Rate: 50 (08/17 2024)  Labs: Recent Labs    11/28/20 0045 11/28/20 0045 11/29/20 0128 11/29/20 0853 11/29/20 1653 11/30/20 0226  HGB 12.7  --  12.7  --   --  11.9*  HCT 41.9  --  42.6  --   --  39.6  PLT 403*  --  388  --   --  338  APTT  --    < > 34 38* 46* 67*  HEPARINUNFRC  --   --  >1.10*  --   --  >1.10*  CREATININE 0.81  --  0.75  --   --   --    < > = values in this interval not displayed.    Estimated Creatinine Clearance: 47.7 mL/min (by C-G formula based on SCr of 0.75 mg/dL).  Assessment: Pharmacy was consulted to dose IV heparin for this 75 yr old woman on apixaban PTA for Afib (held x 1 week for recent hematoma; now on hold for PEG placement on 8/19). Pt with new stroke; she has an abdominal wall hematoma secondary to coughing PTA.  Apixaban was resumed 8/10>8/16;  transitioned back to IV heparin for PEG tube. Hgb 12.7 WNL/stable. Plts 403 WNL/stable. Given recent apixaban exposure, will monitor anticoagulation using aPTT until aPTT and heparin levels correlate.   Planning for PEG on 8/19  8/18 AM update:  aPTT therapeutic after rate increase  Goal of Therapy:  Heparin level 0.3-0.5 units/ml aPTT 66-85 sec Monitor platelets by anticoagulation protocol: Yes   Plan:  Cont heparin 950 units/hr Check aPTT/heparin level in ~8 hrs Monitor daily aPTT, heparin level, CBC Monitor for bleeding  9/18, PharmD, BCPS Clinical Pharmacist Phone: [phone]

## 2020-11-30 NOTE — Progress Notes (Signed)
Patient ID: Beverly Li, female   DOB: Jan 11, 1946, 75 y.o.   MRN: [id number] Patient ID: Beverly Li, female   DOB: 08/11/1945, 75 y.o.   MRN: [id number] STROKE TEAM PROGRESS NOTE   Interval History    Patient husband  a`st the bedside. No acute events overnight.  She continues to have significant global aphasia as well as dysphagia neuro exam is unchanged..   PEG tube is planned by trauma team tomorrow morning.  No new issues..  EEG shows continuous focal left hemispheric slowing.  No seizure activity.  Vital signs are stable.  Neurological exam is unchanged    Pertinent Lab Work and Imaging    11/13/20 CT Head WO IV Contrast 1. Mild asymmetric hypodensity of the left insula, concerning for acute left MCA territory infarct. An MRI could further evaluate. 2. Hyperdense appearance of the distal left M1 MCA, concerning for thrombus. Recommend CTA to further evaluate. 3. No acute hemorrhage. 4. Remote lacunar infarcts versus dilated perivascular spaces in bilateral inferior basal ganglia. Suspected remote lacunar infarct in the right cerebellum.  11/13/20 CT Angio Head and Neck W WO IV Contrast 1. Occlusion of the left M1 MCA with very little opacification of distal left MCA branches. Perfusion demonstrates the core infarct of 41 mL in the anterior left MCA territory with large (122 ml) of ischemic phenomena in the remaining left MCA and potentially left ACA territories. 2. Age indeterminate occlusion of the proximal right internal carotid artery in the neck with non-opacification through the carotid terminus. The right MCA and right posterior communicating artery are opacified via the of the right A1 ACA. 3. Suspected high-grade stenosis versus occlusion of the distal left A3/A4 ACA. 4. Approximately 40-50% stenosis of the proximal left ICA in the neck. 5. Severe stenosis of the non dominant/small right vertebral artery origin. Moderate stenosis of the intradural left vertebral artery. 6.  Severe emphysema.  11/14/20 MRI Brain WO IV Contrast 1. 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 Associated edema without mass effect. 2. Additional scattered punctate acute infarcts in the right frontoparietal cortex and small acute infarct in the right cerebellum.  11/14/20 Echocardiogram Complete   1. There is no evidence of systolic anterior motion of the mitral valve or true LV outflow obstruction on this study. LVOT velocities are mildly elevated at 2 m/s. Left ventricular ejection fraction, by estimation, is 60 to 65%. The left ventricle has low normal function. The left ventricle has no regional wall motion abnormalities. There is moderate concentric left ventricular hypertrophy. Left ventricular diastolic parameters are consistent with Grade II diastolic dysfunction (pseudonormalization). Elevated left atrial pressure.   2. Right ventricular systolic function is normal. The right ventricular size is normal.   3. Left atrial size was severely dilated.   4. Right atrial size was mildly dilated.   5. The mitral valve is degenerative. Mild to moderate mitral valve regurgitation.   6. The aortic valve is tricuspid. There is moderate calcification of the aortic valve. There is mild thickening of the aortic valve. Aortic valve regurgitation is mild. Mild to moderate aortic valve  sclerosis/calcification is present, without any evidence of aortic stenosis.   7. The inferior vena cava is dilated in size with <50% respiratory variability, suggesting right atrial pressure of 15 mmHg.   Physical Examination    Temp:  [97.6 F (36.4 C)-98.3 F (36.8 C)] 97.6 F (36.4 C) (08/18 1102) Pulse Rate:  [50-59] 59 (08/18 1102) Resp:  [19-21] 19 (08/18  1102) BP: (131-157)/(59-78) 136/78 (08/18 1102) SpO2:  [97 %-99 %] 99 % (08/18 1102) Weight:  [52.2 kg] 52.2 kg (08/18 0431)  General - Well nourished, well developed, elderly Caucasian lady on [place] oxygen.  Ophthalmologic - fundi not  visualized due to noncooperation.  Cardiovascular -  not in afib.  Ext: right leg cold to touch compared to left. Perfusion still present, dorsalis pedis pulse is noted bilaterally.  No swelling or redness noted.  Neuro -  MS: awake, alert.  Globally aphasic and not following commands.  Making a few guttural noises but no comprehensible speech CN 2-12: eyes spontanousely open, tracking bilaterally.   has globa aphasia, nonverbal, not following commands, not able to name and repeat and read. Left gaze preference but able to cross midline. Right facial droop.   Motor:  LUE and LLE purposeful movement against gravity. LUE able to hold with no drift. LLE can hold at knee flexion and foot on bed position. RUE and RLE no movement to pain stimulation, bilateral babinski positive.  Sensory: pain sensation in left arm/leg. Absent on right. coordination and gait unable to test due to aphasia.    Assessment and Plan   [name] is a 75 y.o. female w/pmh of AFib on Eliquis (off x 1 week for abdominal hematoma), COPD, remote tobacco abuse, hypertrophic cardiomyopathy, aortic stenosis, PAD who presents with right sided weakness, right facial droop, and left gaze preference. 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  MRI Brain revealed left MCA and ACA stroke.  CTA Head and Neck showed occlusion of the left MCA and an age indeterminate occlusion of the proximal right internal carotid artery in the neck with non-opacification through the carotid terminus.  Echo w/EF 60 to 65 %, left atrium severely dilated.  LDL 120  hemoglobin a1c 6.0.  Heparin IV transitioned to eliquis 11/18/20 Stable  # New right leg vascular stenosis: CTA shows possible distal right femoropopliteal bypass occlusion. Seen by vascular surgery. No intervention recommended.  Clinically  better today with warm legs and dorsalis pedis pulse present b/l.  Con't to monitor.  #PAF Was on elqiuis, on hold PTA due to abdominal hematoma Heparin IV transitioned to eliquis 11/18/20 Afib RVR - cardiology on board Converted to sinus 8/8 Now on po amiodarone  #Respiratory distress, improved - Was intubated due to respiratory distress - extubated to 4L on 11/14/20.  - wax and wane respiratory distress - overall getting better over time - CXR 11/18/20 - partial lung collapse s/p CPAP -> d/c -> repeat CXR 11/19/20 with improvement - CXR 8/10 - Cardiomegaly and evidence of small bilateral pleural effusions with atelectasis, improved since 11/19/2020.   #Hypertension Diltiazem 30 mg QD, Nebivolol 10 mg QD and Hydralazine 25 mg Q8 at home.  Currently blood pressure goal is normotensive.   metoprolol 25mg  bid hydralazine 25mg  Q8h BP stable on the high end  #R ICA stenosis per Dr. , IR showed severe pre occlusive stenosis of the proximal right internal carotid artery associated with a delayed string sign found during cerebral angiogram, patient may require NIR intervention for the Rt ICA stenosis in the future  Follow up with Dr. as outpt Avoid low BP   #Abd hematoma - reportedly developed after coughing 2 weeks prior to admission - Eliquis stopped in hospital in  [place] - hematoma has greatly improved - tolerating IV heparin - switch back to eliquis 11/18/20  #Hyperlipidemia LDL goal is < 70.  LDL is 120 Allergic to statin meds Continue home zetia  Dysphagia  Currently NPO on NGT and TF @ 55 Speech following  Other stroke risk factors Advanced age Former smoker PAD  Other acute issue Leukocytosis - WBC 17.2->14.2->12.0->12.3->12.0->10.3->12.6->15.0   Hospital day # 17 Patient continues to be globally aphasic with dense right hemiplegia and having dysphagia.  Await PEG tube placement tomorrow by trauma team..  discussion with patient `s husband    at the bedside and answered questions.  Greater than 50% time during this 25-minute visit was spent on counseling and coordination of care and discussion with care team and answering questions   Delia Heady, MD Neurology, Stroke Team. 11/30/2020 4:05 PM   To contact Stroke Continuity provider, please refer to [email or web address]. After hours, contact General Neurology

## 2020-11-30 NOTE — TOC Progression Note (Signed)
Transition of Care (TOC) - Progression Note    Patient Details  Name: Beverly Li MRN: [id number] Date of Birth: 11/26/1945  Transition of Care (TOC) CM/SW Contact  Elizabeth M Paisley, LCSW Phone Number: 11/30/2020, 12:23 PM  Clinical Narrative:   CSW met with patient's husband and daughter in law Ellie at bedside to provide bed offers and discuss SNF placement. Extensive discussion was had around bed offers, other preferences that the family wanted CSW to reach out to, and expectations in terms of timing for placement and next steps moving forward. CSW answered all questions and family appreciative of information. Per family request, CSW has sent referral to [facility], [facility], and [facility], and requested that [facility] review referral as it is still pending. Awaiting responses. CSW to follow.      Barriers to Discharge: Continued Medical Work up  Expected Discharge Plan and Services   In-house Referral: Clinical Social Work   Post Acute Care Choice: Skilled Nursing Facility Living arrangements for the past 2 months: Single Family Home                                       Social Determinants of Health (SDOH) Interventions    Readmission Risk Interventions No flowsheet data found.  

## 2020-11-30 NOTE — Progress Notes (Signed)
Patient ID: Beverly Li, female   DOB: 01/15/1946, 75 y.o.   MRN: [id number] I met with her husband and daughter at the bedside. I discussed the planned PEG tomorrow by Dr. Lovick including the procedure, risks, and benefits. I answered their questions. They agree. Will order to hold TF and heparin accordingly.  Burke Thompson, MD, MPH, FACS Please use [email or web address] to contact on call provider  

## 2020-11-30 NOTE — Progress Notes (Signed)
Occupational Therapy Treatment Patient Details Name: Beverly Li MRN: [id number] DOB: [birth year] Today's Date: 11/30/2020    History of present illness 75 y.o. female who presented 11/13/20 with R sided weakness and L gaze preference. Outside window for tPA. 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 S/p thrombectomy with TICI 3 revascularization 8/1. ETT 8/1-8/2. 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 PMH: AFib on Eliquis (off x 1 week), COPD, remote tobacco abuse, hypertrophic cardiomyopathy, aortic stenosis, PAD, anxiety, arthritis, Bell's palsy, HTN, osteopenia, and GERD   OT comments  Pt progressing towards established OT goals. Continues to present with increased engagement. Pt following cues and sustaining attention to hold LLE above bed while donning sock. Pt performing sitting balance activity at EOB with Max A for gaining balance and correcting LOB (posteriorly); achieving Min Guard-Min A level for ~ 30-60 sec. Requiring Max A for stand pivot to recliner. VSS on 4L O2; BP stable; HR 40-50s. Update dc to SNF for post-acute rehab with longer duration. Will continue to follow acutely as admitted.   At end of session, pt with episode of upward eyes gaze, L hand grasping tightly and going into flexion, and slight twitch at LUE. Pt able to refocus on therapist's face after a few seconds. Occurring two more times with upward eye roll and very slight twitch of LUE. Notified RN.   Follow Up Recommendations  SNF    Equipment Recommendations  3 in 1 bedside commode;Wheelchair (measurements OT);Wheelchair cushion (measurements OT);Hospital bed    Recommendations for Other Services Rehab consult    Precautions / Restrictions Precautions Precautions: Fall Precaution Comments: cortrak, R hemiplegia        Mobility Bed Mobility Overal bed mobility: Needs Assistance Bed Mobility: Supine to Sit     Supine to sit: Total assist;HOB elevated     General bed mobility comments: Total A for bringing BLEs towards EOB and then elevating trunk    Transfers Overall transfer level: Needs assistance Equipment used: None Transfers: Stand Pivot Transfers;Sit to/from Stand Sit to Stand: Max assist Stand pivot transfers: Max assist       General transfer comment: Max A to power up and pivot to recliner. Noting pt engaging BLEs to maintain standing with Max A    Balance Overall balance assessment: Needs assistance Sitting-balance support: Single extremity supported;Feet supported Sitting balance-Leahy Scale: Poor Sitting balance - Comments: Initial Max A for sitting balance and then able achieve sitting at EOB with Min Guard- Min A and LUE for support on bedrail   Standing balance support: Bilateral upper extremity supported;During functional activity Standing balance-Leahy Scale: Zero Standing balance comment: Max A, dependent on therapist for standing balance                           ADL either performed or assessed with clinical judgement   ADL Overall ADL's : Needs assistance/impaired                     Lower Body Dressing: Maximal assistance;Bed level Lower Body Dressing Details (indicate cue type and reason): Pt lifting L foot off bed for donnig sock. Toilet Transfer: Maximal assistance;+2 for safety/equipment;Stand-pivot (simulated to recliner) Toilet Transfer Details (indicate cue type and reason): Max A to power up and pivot to  recliner. Noting pt engaging BLEs to maintain standing with Max A Toileting- Clothing Manipulation and Hygiene: Total assistance;+2 for physical assistance Toileting - Clothing Manipulation Details (indicate cue type and reason): Max A for standing balance and then second person for peri care at total level     Functional  mobility during ADLs: Maximal assistance (stand pivot to recliner) General ADL Comments: Pt engaging in sitting balance acitvity at EOB. Then pivot to recliner.     Vision       Perception     Praxis      Cognition Arousal/Alertness: Awake/alert Behavior During Therapy: Flat affect Overall Cognitive Status: Difficult to assess Area of Impairment: Following commands;Safety/judgement;Awareness;Problem solving                   Current Attention Level: Sustained;Focused   Following Commands: Follows one step commands inconsistently Safety/Judgement: Decreased awareness of safety Awareness: Intellectual Problem Solving: Slow processing General Comments: Pt with increased eye contact. Looking at therapist upon entry. Increased following of commands (i.e. lifting L foot off bed for donning sock and lifting hard).        Exercises General Exercises - Lower Extremity Short Arc Quad:  (bed in chair position)   Shoulder Instructions       General Comments HR 40-50s. SpO2 90s on 4L. BP stable. Noting at end of session, pt with episode of eyes gazing upward, L hand grasping tightly and going into flexion, and slight twitch at LUE. Pt abel to refocus on therapist's face after a few seconds. Noting this occurring two more times with upward eye roll and veyr slight twitch of LUE. Notified RN.    Pertinent Vitals/ Pain       Pain Assessment: Faces Faces Pain Scale: No hurt Pain Location: generalized Pain Descriptors / Indicators: Grimacing Pain Intervention(s): Monitored during session;Limited activity within patient's tolerance;Repositioned  Home Living                                          Prior Functioning/Environment              Frequency  Min 2X/week        Progress Toward Goals  OT Goals(current goals can now be found in the care plan section)  Progress towards OT goals: Progressing toward goals  Acute Rehab OT Goals Patient Stated  Goal: spouse would like pt to go to [facility] OT Goal Formulation: With patient/family Time For Goal Achievement: 12/12/20 Potential to Achieve Goals: Fair ADL Goals Pt Will Perform Grooming: with mod assist;sitting Additional ADL Goal #1: Pt will sustain attention during simple ADL with Min cues Additional ADL Goal #2: Pt will maintain static sitting at EOB with Min Guard A for 5 minutes in preparation for ADLs  Plan Discharge plan remains appropriate    Co-evaluation                 AM-PAC OT "6 Clicks" Daily Activity     Outcome Measure   Help from another person eating meals?: Total Help from another person taking care of personal grooming?: A Lot Help from another person toileting, which includes using toliet, bedpan, or urinal?: Total Help from another person bathing (including washing, rinsing, drying)?: Total Help from another person to put on and taking off regular upper body clothing?: Total Help from another person to put on and taking off regular lower body  clothing?: Total 6 Click Score: 7    End of Session Equipment Utilized During Treatment: Gait belt;Oxygen  OT Visit Diagnosis: Unsteadiness on feet (R26.81);Muscle weakness (generalized) (M62.81);Hemiplegia and hemiparesis Hemiplegia - Right/Left: Right Hemiplegia - dominant/non-dominant: Dominant Hemiplegia - caused by: Cerebral infarction   Activity Tolerance Patient tolerated treatment well   Patient Left in chair;with call bell/phone within reach;with chair alarm set   Nurse Communication Mobility status;Need for lift equipment        Time: 8206-0156 OT Time Calculation (min): 39 min  Charges: OT General Charges $OT Visit: 1 Visit OT Treatments $Self Care/Home Management : 38-52 mins  Beverly Li MSOT, OTR/L Acute Rehab Pager: [phone] Office: [phone]   Beverly Li 11/30/2020, 1:29 PM

## 2020-12-01 ENCOUNTER — Inpatient Hospital Stay (HOSPITAL_COMMUNITY): Payer: Medicare Other

## 2020-12-01 ENCOUNTER — Encounter (HOSPITAL_COMMUNITY)
Admission: EM | Disposition: A | Discharge status: Skilled Nursing Facility | Payer: Self-pay | Source: Home / Self Care | Attending: Neurology

## 2020-12-01 ENCOUNTER — Inpatient Hospital Stay (HOSPITAL_COMMUNITY): Payer: Medicare Other | Admitting: Anesthesiology

## 2020-12-01 ENCOUNTER — Encounter (HOSPITAL_COMMUNITY): Payer: Self-pay | Admitting: Neurology

## 2020-12-01 ENCOUNTER — Other Ambulatory Visit: Payer: Self-pay

## 2020-12-01 DIAGNOSIS — I6602 Occlusion and stenosis of left middle cerebral artery: Secondary | ICD-10-CM | POA: Diagnosis not present

## 2020-12-01 DIAGNOSIS — J9621 Acute and chronic respiratory failure with hypoxia: Secondary | ICD-10-CM

## 2020-12-01 DIAGNOSIS — J690 Pneumonitis due to inhalation of food and vomit: Secondary | ICD-10-CM | POA: Diagnosis not present

## 2020-12-01 DIAGNOSIS — I63512 Cerebral infarction due to unspecified occlusion or stenosis of left middle cerebral artery: Secondary | ICD-10-CM | POA: Diagnosis not present

## 2020-12-01 HISTORY — PX: ESOPHAGOGASTRODUODENOSCOPY (EGD) WITH PROPOFOL: SHX5813

## 2020-12-01 HISTORY — PX: PEG PLACEMENT: SHX5437

## 2020-12-01 LAB — CBC
HCT: 39.3 % (ref 36.0–46.0)
Hemoglobin: 12 g/dL (ref 12.0–15.0)
MCH: 31.3 pg (ref 26.0–34.0)
MCHC: 30.5 g/dL (ref 30.0–36.0)
MCV: 102.3 fL — ABNORMAL HIGH (ref 80.0–100.0)
Platelets: 301 10*3/uL (ref 150–400)
RBC: 3.84 MIL/uL — ABNORMAL LOW (ref 3.87–5.11)
RDW: 17.8 % — ABNORMAL HIGH (ref 11.5–15.5)
WBC: 13.2 10*3/uL — ABNORMAL HIGH (ref 4.0–10.5)
nRBC: 0 % (ref 0.0–0.2)

## 2020-12-01 LAB — BASIC METABOLIC PANEL
Anion gap: 7 (ref 5–15)
BUN: 49 mg/dL — ABNORMAL HIGH (ref 8–23)
CO2: 33 mmol/L — ABNORMAL HIGH (ref 22–32)
Calcium: 8.6 mg/dL — ABNORMAL LOW (ref 8.9–10.3)
Chloride: 109 mmol/L (ref 98–111)
Creatinine, Ser: 0.77 mg/dL (ref 0.44–1.00)
GFR, Estimated: >60 mL/min (ref >60)
Glucose, Bld: 93 mg/dL (ref 70–99)
Potassium: 3.8 mmol/L (ref 3.5–5.1)
Sodium: 149 mmol/L — ABNORMAL HIGH (ref 135–145)

## 2020-12-01 LAB — GLUCOSE, CAPILLARY
Glucose-Capillary: 118 mg/dL — ABNORMAL HIGH (ref 70–99)
Glucose-Capillary: 146 mg/dL — ABNORMAL HIGH (ref 70–99)
Glucose-Capillary: 158 mg/dL — ABNORMAL HIGH (ref 70–99)
Glucose-Capillary: 176 mg/dL — ABNORMAL HIGH (ref 70–99)
Glucose-Capillary: 77 mg/dL (ref 70–99)
Glucose-Capillary: 81 mg/dL (ref 70–99)
Glucose-Capillary: 96 mg/dL (ref 70–99)

## 2020-12-01 LAB — BLOOD GAS, ARTERIAL
Acid-Base Excess: 3.8 mmol/L — ABNORMAL HIGH (ref 0.0–2.0)
Bicarbonate: 27.8 mmol/L (ref 20.0–28.0)
Drawn by: 164
FIO2: 36
O2 Saturation: 92 %
Patient temperature: 37
pCO2 arterial: 41.5 mmHg (ref 32.0–48.0)
pH, Arterial: 7.44 (ref 7.350–7.450)
pO2, Arterial: 61.7 mmHg — ABNORMAL LOW (ref 83.0–108.0)

## 2020-12-01 LAB — HEPARIN LEVEL (UNFRACTIONATED): Heparin Unfractionated: >1.1 IU/mL — ABNORMAL HIGH (ref 0.30–0.70)

## 2020-12-01 LAB — APTT: aPTT: 77 seconds — ABNORMAL HIGH (ref 24–36)

## 2020-12-01 SURGERY — ESOPHAGOGASTRODUODENOSCOPY (EGD) WITH PROPOFOL
Anesthesia: Monitor Anesthesia Care

## 2020-12-01 MED ORDER — PROPOFOL 10 MG/ML IV BOLUS
INTRAVENOUS | Status: DC | PRN
  Administered 2020-12-01 (×2): 10 mg via INTRAVENOUS

## 2020-12-01 MED ORDER — METHYLPREDNISOLONE SODIUM SUCC 40 MG IJ SOLR
40.0000 mg | Freq: Two times a day (BID) | INTRAMUSCULAR | Status: AC
  Administered 2020-12-01 – 2020-12-02 (×2): 40 mg via INTRAVENOUS
  Filled 2020-12-01 (×2): qty 1

## 2020-12-01 MED ORDER — JEVITY 1.2 CAL PO LIQD
1000.0000 mL | ORAL | Status: DC
  Administered 2020-12-01 – 2020-12-05 (×5): 1000 mL
  Filled 2020-12-01 (×5): qty 1000

## 2020-12-01 MED ORDER — LACTATED RINGERS IV SOLN: INTRAVENOUS | Status: DC | PRN

## 2020-12-01 MED ORDER — PROPOFOL 500 MG/50ML IV EMUL
INTRAVENOUS | Status: DC | PRN
  Administered 2020-12-01: 50 ug/kg/min via INTRAVENOUS

## 2020-12-01 MED ORDER — LEVALBUTEROL HCL 0.63 MG/3ML IN NEBU
0.6300 mg | INHALATION_SOLUTION | RESPIRATORY_TRACT | Status: AC
  Administered 2020-12-01: 0.63 mg via RESPIRATORY_TRACT
  Filled 2020-12-01: qty 3

## 2020-12-01 MED ORDER — HEPARIN (PORCINE) 25000 UT/250ML-% IV SOLN
1050.0000 [IU]/h | INTRAVENOUS | Status: AC
  Administered 2020-12-01: 950 [IU]/h via INTRAVENOUS
  Administered 2020-12-02: 1050 [IU]/h via INTRAVENOUS
  Filled 2020-12-01 (×3): qty 250

## 2020-12-01 MED ORDER — POLYVINYL ALCOHOL 1.4 % OP SOLN
1.0000 [drp] | OPHTHALMIC | Status: DC | PRN
  Filled 2020-12-01: qty 15

## 2020-12-01 NOTE — Significant Event (Signed)
Rapid Response Event Note   Reason for Call :  Labored breathing, BP 174/82 Sp PEG placement  Initial Focused Assessment:  Pt is alert but non verbal Mild increased WOB, using accessory muscles Lung sounds are clear Heart tones irregular Knees mottled, per RN has been mottled  BP 117/73 ( has received hydralazine) HR 90-110 RR 28 O2 sat 91% on 4L [place] Rectal temp 98.7  Abdomen tender at PEG site   Interventions:  ABG PCXR   Plan of Care:  Continue frequent VS Treat pain    Event Summary:   MD Notified:  Dr Dartha Lodge Call Time: 1640 Arrival Time: 1700 End Time: 1730  Marcellina Millin, RN

## 2020-12-01 NOTE — Progress Notes (Signed)
Patient ID: Beverly Li, female   DOB: 08/25/1945, 75 y.o.   MRN: [id number] Patient ID: Beverly Li, female   DOB: [birth year], 75 y.o.   MRN: [id number] STROKE TEAM PROGRESS NOTE   Interval History    Patient husband  is at the bedside. No acute events overnight.  She had PEG tube inserted this morning and procedure went well.  She is slightly groggy postprocedure from the anesthesia effect.  She continues to have significant global aphasia as well as dysphagia neuro exam is unchanged..   .  Neurological exam is otherwise unchanged    Pertinent Lab Work and Imaging    11/13/20 CT Head WO IV Contrast 1. Mild asymmetric hypodensity of the left insula, concerning for acute left MCA territory infarct. An MRI could further evaluate. 2. Hyperdense appearance of the distal left M1 MCA, concerning for thrombus. Recommend CTA to further evaluate. 3. No acute hemorrhage. 4. Remote lacunar infarcts versus dilated perivascular spaces in bilateral inferior basal ganglia. Suspected remote lacunar infarct in the right cerebellum.  11/13/20 CT Angio Head and Neck W WO IV Contrast 1. Occlusion of the left M1 MCA with very little opacification of distal left MCA branches. Perfusion demonstrates the core infarct of 41 mL in the anterior left MCA territory with large (122 ml) of ischemic phenomena in the remaining left MCA and potentially left ACA territories. 2. Age indeterminate occlusion of the proximal right internal carotid artery in the neck with non-opacification through the carotid terminus. The right MCA and right posterior communicating artery are opacified via the of the right A1 ACA. 3. Suspected high-grade stenosis versus occlusion of the distal left A3/A4 ACA. 4. Approximately 40-50% stenosis of the proximal left ICA in the neck. 5. Severe stenosis of the non dominant/small right vertebral artery origin. Moderate stenosis of the intradural left vertebral artery. 6. Severe emphysema.  11/14/20  MRI Brain WO IV Contrast 1. 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 Associated edema without mass effect. 2. Additional scattered punctate acute infarcts in the right frontoparietal cortex and small acute infarct in the right cerebellum.  11/14/20 Echocardiogram Complete   1. There is no evidence of systolic anterior motion of the mitral valve or true LV outflow obstruction on this study. LVOT velocities are mildly elevated at 2 m/s. Left ventricular ejection fraction, by estimation, is 60 to 65%. The left ventricle has low normal function. The left ventricle has no regional wall motion abnormalities. There is moderate concentric left ventricular hypertrophy. Left ventricular diastolic parameters are consistent with Grade II diastolic dysfunction (pseudonormalization). Elevated left atrial pressure.   2. Right ventricular systolic function is normal. The right ventricular size is normal.   3. Left atrial size was severely dilated.   4. Right atrial size was mildly dilated.   5. The mitral valve is degenerative. Mild to moderate mitral valve regurgitation.   6. The aortic valve is tricuspid. There is moderate calcification of the aortic valve. There is mild thickening of the aortic valve. Aortic valve regurgitation is mild. Mild to moderate aortic valve  sclerosis/calcification is present, without any evidence of aortic stenosis.   7. The inferior vena cava is dilated in size with <50% respiratory variability, suggesting right atrial pressure of 15 mmHg.   Physical Examination    Temp:  [97.2 F (36.2 C)-98.8 F (37.1 C)] 98 F (36.7 C) (08/19 1625) Pulse Rate:  [53-90] 90 (08/19 1742) Resp:  [15-28] 28 (08/19 1742) BP: (117-189)/(61-82) 117/73 (08/19  1742) SpO2:  [91 %-97 %] 94 % (08/19 1742) Weight:  [52.1 kg] 52.1 kg (08/19 0500)  General - Well nourished, well developed, elderly Caucasian lady on [place] oxygen.  Ophthalmologic - fundi not visualized due to  noncooperation.  Cardiovascular -  not in afib.  Ext: right leg cold to touch compared to left. Perfusion still present, dorsalis pedis pulse is noted bilaterally.  No swelling or redness noted.  Neuro -  MS: awake, alert.  Globally aphasic and not following commands.  Making a few guttural noises but no comprehensible speech CN 2-12: eyes spontanousely open, tracking bilaterally.   has globa aphasia, nonverbal, not following commands, not able to name and repeat and read. Left gaze preference but able to cross midline. Right facial droop.   Motor:  LUE and LLE purposeful movement against gravity. LUE able to hold with no drift. LLE can hold at knee flexion and foot on bed position. RUE and RLE no movement to pain stimulation, bilateral babinski positive.  Sensory: pain sensation in left arm/leg. Absent on right. coordination and gait unable to test due to aphasia.    Assessment and Plan   [name] is a 75 y.o. female w/pmh of AFib on Eliquis (off x 1 week for abdominal hematoma), COPD, remote tobacco abuse, hypertrophic cardiomyopathy, aortic stenosis, PAD who presents with right sided weakness, right facial droop, and left gaze preference. 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  MRI Brain revealed left MCA and ACA stroke.  CTA Head and Neck showed occlusion of the left MCA and an age indeterminate occlusion of the proximal right internal carotid artery in the neck with non-opacification through the carotid terminus.  Echo w/EF 60 to 65 %, left atrium severely dilated.  LDL 120  hemoglobin a1c 6.0.  Eliquis to be resumed after PEG tube Stable  # New right leg vascular stenosis: CTA shows possible distal right femoropopliteal bypass occlusion. Seen by vascular surgery. No intervention recommended.  Clinically better today with warm  legs and dorsalis pedis pulse present b/l.  Con't to monitor.  #PAF Was on elqiuis, on hold PTA due to abdominal hematoma Heparin IV transitioned to eliquis 11/18/20 Afib RVR - cardiology on board Converted to sinus 8/8 Now on po amiodarone  #Respiratory distress, improved - Was intubated due to respiratory distress - extubated to 4L on 11/14/20.  - wax and wane respiratory distress - overall getting better over time - CXR 11/18/20 - partial lung collapse s/p CPAP -> d/c -> repeat CXR 11/19/20 with improvement - CXR 8/10 - Cardiomegaly and evidence of small bilateral pleural effusions with atelectasis, improved since 11/19/2020.   #Hypertension Diltiazem 30 mg QD, Nebivolol 10 mg QD and Hydralazine 25 mg Q8 at home.  Currently blood pressure goal is normotensive.   metoprolol 25mg  bid hydralazine 25mg  Q8h BP stable on the high end  #R ICA stenosis per Dr. , IR showed severe pre occlusive stenosis of the proximal right internal carotid artery associated with a delayed string sign found during cerebral angiogram, patient may require NIR intervention for the Rt ICA stenosis in the future  Follow up with Dr. as outpt Avoid low BP   #Abd hematoma - reportedly developed after coughing 2 weeks prior to admission - Eliquis stopped in hospital in [place] - hematoma  has greatly improved - tolerating IV heparin - switch back to eliquis 11/18/20  #Hyperlipidemia LDL goal is < 70.  LDL is 120 Allergic to statin meds Continue home zetia  Dysphagia  Currently NPO on NGT and TF @ 55 Speech following  Other stroke risk factors Advanced age Former smoker PAD  Other acute issue Leukocytosis - WBC 17.2->14.2->12.0->12.3->12.0->10.3->12.6->15.0   Hospital day # 18 Patient had PEG tube done today.  Recommend resume Eliquis when safe as per surgery.  Continue ongoing therapies.  Transfer to skilled nursing facility for rehab early next week hopefully.  Stroke team  will sign off.  Kindly call for questions.  Discussed with Dr. Dartha Lodge.  Greater than 50% time during this 25-minute visit was spent on counseling and coordination of care stroke and aphasia and dysphagia and answering questions.   Delia Heady, MD Neurology, Stroke Team. 12/01/2020 6:15 PM   To contact Stroke Continuity provider, please refer to [email or web address]. After hours, contact General Neurology

## 2020-12-01 NOTE — Anesthesia Postprocedure Evaluation (Signed)
Anesthesia Post Note  Patient: Beverly Li  Procedure(s) Performed: ESOPHAGOGASTRODUODENOSCOPY (EGD) WITH PROPOFOL PERCUTANEOUS ENDOSCOPIC GASTROSTOMY (PEG) PLACEMENT     Patient location during evaluation: PACU Anesthesia Type: MAC Level of consciousness: awake and alert Pain management: pain level controlled Vital Signs Assessment: post-procedure vital signs reviewed and stable Respiratory status: spontaneous breathing, nonlabored ventilation and respiratory function stable Cardiovascular status: blood pressure returned to baseline and stable Postop Assessment: no apparent nausea or vomiting Anesthetic complications: no   No notable events documented.  Last Vitals:  Vitals:   12/01/20 0846 12/01/20 1025  BP: (!) 169/75 (!) 157/61  Pulse: (!) 57 (!) 58  Resp: 19 (!) 22  Temp: (!) 36.2 C 36.7 C  SpO2: 97% 92%    Last Pain:  Vitals:   12/01/20 1025  TempSrc: Oral  PainSc:                  Lynda Rainwater

## 2020-12-01 NOTE — Progress Notes (Signed)
   12/01/20 1625  Assess: MEWS Score  Temp 98 F (36.7 C)  BP 117/73  Resp (!) 27  Level of Consciousness Alert  SpO2 91 %  O2 Device Nasal Cannula  O2 Flow Rate (L/min) 4 L/min  Assess: MEWS Score  MEWS Temp 0  MEWS Systolic 0  MEWS Pulse 0  MEWS RR 2  MEWS LOC 0  MEWS Score 2  MEWS Score Color Yellow  Assess: if the MEWS score is Yellow or Red  Were vital signs taken at a resting state? Yes  Focused Assessment Change from prior assessment (see assessment flowsheet)  Early Detection of Sepsis Score *See Row Information* Low  MEWS guidelines implemented *See Row Information* Yes  Treat  Faces Pain Scale 4  Take Vital Signs  Increase Vital Sign Frequency  Yellow: Q 2hr X 2 then Q 4hr X 2, if remains yellow, continue Q 4hrs  Escalate  MEWS: Escalate Yellow: discuss with charge nurse/RN and consider discussing with provider and RRT  Notify: Charge Nurse/RN  Name of Charge Nurse/RN Notified Arlys John  Date Charge Nurse/RN Notified 12/01/20  Time Charge Nurse/RN Notified 1637  Notify: Provider  Provider Name/Title Dr. Dartha Lodge  Date Provider Notified 12/01/20  Time Provider Notified 1637  Notification Type Page  Notification Reason Change in status;Requested by patient/family  Provider response See new orders  Date of Provider Response 12/01/20  Time of Provider Response 1645  Notify: Rapid Response  Name of Rapid Response RN Notified hella  Date Rapid Response Notified 12/01/20  Time Rapid Response Notified 1640  Document  Patient Outcome Stabilized after interventions  Progress note created (see row info) Yes

## 2020-12-01 NOTE — Op Note (Signed)
   Procedure Note  Date: 12/01/2020  Procedure: esophagogastroduodenoscopy (EGD) and percutaneous endoscopic gastrostomy (PEG) tube placement  Pre-op diagnosis: dysphagia, malnutrition Post-op diagnosis: same  Indication and clinical history: 31F with dysphagia after stroke.   Surgeon: Jesusita Oka, MD Assistant: Wynetta Emery, [place]  Anesthesia: MAC  Findings: normal esophagus, stomach, visualized duodenum Specimen: none EBL: <5cc Drains/Implants: PEG tube,  2.5 cm at the skin   Disposition: PACU  Description of Procedure: The patient was positioned supine. Time-out was performed verifying correct patient, procedure, signature of informed consent, and pre-operative antibiotics as indicated. MAC induction was uneventful and a bite block was placed into the oropharynx. The endoscope was inserted into the oropharynx and advanced down the esophagus into the stomach and into the duodenum. The visualized esophagus and duodenum were unremarkable. The endoscope was retracted back into the stomach and the stomach was insufflated. The stomach was inspected and was also normal. Transillumination was performed. The light was visible on the external skin and dimpling of the stomach was noted endoscopically with manual pressure. The abdomen was prepped and draped in the usual sterile fashion. Transillumination and dimpling were repeated and local anesthetic was infiltrated to make a skin wheal at the site of transillumination. The needle was inserted perpendicularly to the skin and the tip of the needle was visualized endoscopically. As the needle was retracted, the tract was also anesthetized. A skin nick was made at the site of the wheal and an introducer needle and sheath were inserted. The needle was removed and guidewire inserted. The guidewire was grasped by an endoscopic snare and the snare, guidewire, and endoscope retracted out of the oropharynx. The PEG tube was secured to the guidewire and retracted  through the mouth and esophagus into the stomach. The PEG tube was secured with a bolster and was visualized endoscopically to spin freely circumferentially and also be without gaps between the internal bumper and the stomach wall. There was no evidence of bleeding. The PEG bolster was secured at  2.5 cm at the skin and there were no gaps between the bolster and the abdominal wall. The stomach was desufflated endoscopically and the endoscope removed. The bite block was also removed. The patient tolerated the procedure well and there were no complications.   The patient may have water and medications administered via the PEG tube beginning immediately and tube feeds may be initiated four hours post-procedure. May restart heparin drip without bolus at 1400 today.   Jesusita Oka, MD General and [place] [facility]

## 2020-12-01 NOTE — Plan of Care (Signed)
  Problem: Nutrition: Goal: Risk of aspiration will decrease Outcome: Progressing Goal: Dietary intake will improve Outcome: Progressing   Problem: Clinical Measurements: Goal: Will remain free from infection Outcome: Progressing Goal: Diagnostic test results will improve Outcome: Progressing Goal: Respiratory complications will improve Outcome: Progressing

## 2020-12-01 NOTE — Progress Notes (Signed)
Patient hasn't worn CPAP the last few nights. Patient is nonverbal. Will continue O2 at 5 Lpm. Vitals stable.

## 2020-12-01 NOTE — Progress Notes (Signed)
ANTICOAGULATION CONSULT NOTE - f/u Consult  Pharmacy Consult for Heparin Indication: h/o Afib, recent CVA, PVD  Allergies  Allergen Reactions   Amoxicillin Other (See Comments)    UTI Has patient had a PCN reaction causing immediate rash, facial/tongue/throat swelling, SOB or lightheadedness with hypotension: No Has patient had a PCN reaction causing severe rash involving mucus membranes or skin necrosis: No Has patient had a PCN reaction that required hospitalization: No Has patient had a PCN reaction occurring within the last 10 years: Yes--UTI ONLY If all of the above answers are "NO", then may proceed with Cephalosporin use.    Atenolol Cough   Crestor [Rosuvastatin Calcium] Other (See Comments)    Muscle aches   Pravastatin Other (See Comments)    Muscle aches   Sulfa Antibiotics Nausea Only   Codeine     hallucinations    Patient Measurements: Height: 5\' 3"  (160 cm) Weight: 52.1 kg (114 lb 13.8 oz) IBW/kg (Calculated) : 52.4 Heparin Dosing Weight: 49.5 kg  Vital Signs: Temp: 98.6 F (37 C) (08/19 0741) Temp Source: Axillary (08/19 0741) BP: 150/71 (08/19 0741) Pulse Rate: 59 (08/19 0741)  Labs: Recent Labs    11/29/20 0128 11/29/20 0853 11/30/20 0226 11/30/20 0826 11/30/20 1301 12/01/20 0516  HGB 12.7  --  11.9*  --   --  12.0  HCT 42.6  --  39.6  --   --  39.3  PLT 388  --  338  --   --  301  APTT 34   < > 67*  --  80* 77*  HEPARINUNFRC >1.10*  --  >1.10*  --  >1.10* >1.10*  CREATININE 0.75  --   --  0.80  --  0.77   < > = values in this interval not displayed.     Estimated Creatinine Clearance: 50 mL/min (by C-G formula based on SCr of 0.77 mg/dL).   Medical History:   Assessment: Anticoag: Eliquis PTA for Afib (held x 1 week for recent hematoma) > new stroke. - she does have an abdominal wall hematoma secondary to coughing PTA-improving. -Apixaban resumed 8/10>8/16. Back to IV heparin for PEG tube. Hgb 12. Plts 301 - aPTT 77 remains in goal  rang . HL>1.1 due to drug/lab interaction with Eliquis.  Goal of Therapy:  aPTT 66-85 Heparin level 0.3-0.5 units/ml Monitor platelets by anticoagulation protocol: Yes   Plan:  Plan for PEG 8/19. Stop heparin 0500 Daily HL, aPTT, and CBC.   Carroll Lingelbach S. 9/19, PharmD, BCPS Clinical Staff Pharmacist [email or web address] Merilynn Finland, Aiden Rao Stillinger 12/01/2020,8:19 AM

## 2020-12-01 NOTE — Progress Notes (Signed)
PROGRESS NOTE    Beverly Li  IPJ:[id number] DOB: [birth year] DOA: 11/13/2020 PCP: Clayborn Heron, MD   Chief Complain: Right-sided weakness, facial droop  Brief Narrative: Patient is a 75 year old female with history of A. fib, COPD, tobacco abuse, hypertrophic cardiomyopathy, aortic stenosis, peripheral artery disease who initially presented here on 8/7 with right-sided weakness, facial droop, left gaze preference.  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  Neurology/pccm  was consulted.  She had to be intubated on presentation.  She underwent thrombectomy with TICI 3 flow to left M1 segment.  Hospital course was remarkable for persistent aphasia, dysphagia.  She was transferred from [facility] [place] service to hospitalist team on 8/16.  She was started on tube feeding for persistent dysphagia.  Plan for PEG placement on 8/19.  Eventually plan is to get discharged to skilled nursing facility.  Important events:  8/1 admitted, intubated, thrombectomy 8/2 Successful thrombectomy with complete revasculatization, TICI 3 flow. MRI with L ACA/MCA territory infarcts and multiple small punctate infarcts. Extubated late in day to [place]. 8/3 Afib on tele. Amio load + gtt started. Cortrak placement. 8/4 Slow improvement in neuro status, following more commands (LUE/LLE), tracking consistently, working with PT/OT/SLP. 8/5 patient converted to sinus rhythm, cardiology is following on amiodarone infusion 8/6 Patient remained in sinus bradycardia, cardiology recommended stopping beta-blocker and switching amiodarone to p.o 8/7 Started with acute urinary retention, requiring straight cath twice. Afib with RVR. CXR with LLL collapse. Increased O2 requirement, could not tolerate BiPAP. Transitioned to [facility] 30L/30% FiO2. 8/8 Improving respiratory status, still volume up, working with therapies. 8/9 Weaned down to nasal cannula, tolerating well. Moving LUE/LLE purposefully. Following commands. Remains globally aphasic.  Working with PT/OT. BNP 1200, additional diuresis today. CXR with atelectasis. 8/10 Desaturations overnight/early AM to 81%. 3L [place] -> HFNC -> Venturi mask. CXR with small bilateral pleural effusions and atelectasis, improved from prior. Suspect desats mainly positional. Lasix 40mg  IV x 1 given. 8/11 diuresis stopped 2/2 contraction alkalosis   12/01/2020: Patient seen alongside patient's husband.  Also discussed with the patient's nurse extensively.  Patient's pattern noted.  Patient's husband turning ophthalmologist patient was clearly over perspiring.  Patient is new to me.  Patient has history of COPD.  Patient is aphasic.  No history from patient.  No documented fever.  Patient has leukocytosis, though, improving slightly.  Patient is already on neb treatment.  Patient underwent PEG placement earlier today.  Patient reevaluated towards the end of the day as patient's husband was worried that the patient is breathing.  Stat ABG and chest x-ray were ordered.  We will have low threshold to start patient on antibiotics.  We will also have a low threshold to start patient on IV Solu-Medrol.    Assessment & Plan:   Active Problems:   Obstructive hypertrophic cardiomyopathy (HCC)   Stroke (HCC)   Middle cerebral artery embolism, left   Protein-calorie malnutrition, severe   Pressure injury of skin   Left ACA/MCA territory infarcts with scattered punctuate right frontoparietal cortical and right cerebellar infarcts: Neurology was consulted and following.  Status post MCA thrombectomy with TICI 3.  Stroke was suspected to be embolic. She was taking Eliquis for anticoagulation for A. fib  at home but it was held due to abdominal hematoma.  Echo showed ejection fraction of 60 to 65%, severely dilated left atrium.  LDL of 120.  Hemoglobin A1 C of 6. She is globally aphasic, has dense right hemiplegia and dysphagia. Patient was also found  to have right ICA stenosis.  Noted to have delayed string sign on  cerebral angiogram.  Follow-up with IR as an outpatient recommended. 12/01/2020: Neurology team is directing care.  Patient remains aphasic.  Acute hypoxic respiratory failure: Developed acute respiratory failure after CT scan, was bagged by ED physician on the way to IR for thrombectomy.  Patient was intubated and put on ventilator in IR.  Was on PCCM service.  Currently extubated.  On 2 L of oxygen per minute.  Continue to try to wean the oxygen. 12/01/2020: ABG reviewed.  Chest x-ray reviewed.  Patient is on supplemental oxygen.  IV Solu-Medrol 40 Mg every 12x2 doses are reviewed.  Low threshold to start patient on IV antibiotics.  Patient is high risk for aspiration.  Dysphagia/aphasia: Secondary to stroke.  Currently has a feeding tube.  Plan is to put a PEG tube by surgical team on 8/19 after Eliquis washout.  Continue heparin drip for now.  Paroxysmal A. fib with RVR: Converted to normal sinus rhythm on 8/8.  She was started on amiodarone.  Currently in sinus bradycardia with occasional junctional beat.  On metoprolol for rate control.  Currently on anticoagulation with heparin, plan is to restart Eliquis.  She was previously taking Eliquis but was held be due to abdominal hematoma.  Hypernatremia: Continue free water.  Monitor BMP 12/01/2020: Increase free water from 200 cc every 4 to 300 cc every 4 hourly.  Continue to monitor closely.  Leukocytosis: Continue monitoring.  No clear signs of infection 12/01/2020: Repeat chest x-ray reveals bibasilar consolidation.  Low threshold to start patient on IV antibiotics.  Right femoropopliteal bypass graft stenosis: Vascular surgery was following.  Recommend outpatient follow-up.  Hypertension: Continue to monitor blood pressure.  On hydralazine and metoprolol.  Hyperlipidemia: Continue Zetia.  LDL of 120.  Abdominal hematoma: Reportedly developed after coughing 2 weeks prior to admission..Significantly improved.  Debility/deconditioning: PT/OT  recommended skilled nursing facility on discharge.  TOC following   Nutrition Problem: Severe Malnutrition Etiology: chronic illness (COPD)      DVT prophylaxis:Heparin IV Code Status: Full Family Communication: Husband and daughter in law at bedside Status is: Inpatient  Remains inpatient appropriate because:Inpatient level of care appropriate due to severity of illness  Dispo: The patient is from: Home              Anticipated d/c is to: SNF              Patient currently is not medically stable to d/c.   Difficult to place patient No   Consultants: Neurology, IR, PCCM  Procedures: As above  Antimicrobials:  Anti-infectives (From admission, onward)    Start     Dose/Rate Route Frequency Ordered Stop   11/13/20 1423  ceFAZolin (ANCEF) 2-4 GM/100ML-% IVPB       Note to Pharmacy: [name]: cabinet override      11/13/20 1423 11/14/20 0229       Subjective: Aphasic. No history from patient. Patient seems to be over perspiring.  Objective: Vitals:   12/01/20 1047 12/01/20 1107 12/01/20 1551 12/01/20 1625  BP:  (!) 154/77 (!) 174/82 117/73  Pulse: (!) 59 (!) 57 64   Resp: 19 15 (!) 22 (!) 27  Temp:  98 F (36.7 C)  98 F (36.7 C)  TempSrc:  Oral  Oral  SpO2: 97% 95% 95% 91%  Weight:      Height:        Intake/Output Summary (Last 24 hours) at  12/01/2020 1705 Last data filed at 12/01/2020 1500 Gross per 24 hour  Intake 400 ml  Output 885 ml  Net -485 ml    Filed Weights   11/29/20 0500 December 24, 2020 0431 12/01/20 0500  Weight: 49.7 kg 52.2 kg 52.1 kg    Examination:  General exam:very deconditioned, ill looking, weak, lying in bed HEENT: No pallor or jaundice Respiratory system:  Decreased air entry globally Cardiovascular system: S1 & S2 heard, RRR.  Gastrointestinal system: Abdomen is non-distended. S/P PEG tube placement.   Central nervous system: Awake. Aphasic with dense right hemiplegia  Extremities: No edema    Data Reviewed: I  have personally reviewed following labs and imaging studies  CBC: Recent Labs  Lab 11/27/20 0142 11/28/20 0045 11/29/20 0128 Dec 24, 2020 0226 12/01/20 0516  WBC 17.5* 16.9* 14.0* 14.2* 13.2*  HGB 13.0 12.7 12.7 11.9* 12.0  HCT 41.6 41.9 42.6 39.6 39.3  MCV 99.8 101.2* 102.9* 101.8* 102.3*  PLT 419* 403* 388 338 301    Basic Metabolic Panel: Recent Labs  Lab 11/27/20 0142 11/28/20 0045 11/29/20 0128 2020/12/24 0826 12/01/20 0516  NA 150* 150* 151* 149* 149*  K 4.1 4.3 4.4 3.8 3.8  CL 112* 115* 113* 113* 109  CO2 32 27 30 32 33*  GLUCOSE 125* 95 140* 110* 93  BUN 59* 60* 60* 57* 49*  CREATININE 0.88 0.81 0.75 0.80 0.77  CALCIUM 9.1 8.9 8.9 8.5* 8.6*  MG  --   --  2.3  --   --   PHOS  --   --  3.4  --   --     GFR: Estimated Creatinine Clearance: 50 mL/min (by C-G formula based on SCr of 0.77 mg/dL). Liver Function Tests: No results for input(s): AST, ALT, ALKPHOS, BILITOT, PROT, ALBUMIN in the last 168 hours. No results for input(s): LIPASE, AMYLASE in the last 168 hours. No results for input(s): AMMONIA in the last 168 hours. Coagulation Profile: No results for input(s): INR, PROTIME in the last 168 hours. Cardiac Enzymes: No results for input(s): CKTOTAL, CKMB, CKMBINDEX, TROPONINI in the last 168 hours. BNP (last 3 results) No results for input(s): PROBNP in the last 8760 hours. HbA1C: No results for input(s): HGBA1C in the last 72 hours. CBG: Recent Labs  Lab 12/01/20 0019 12/01/20 0405 12/01/20 1108 12/01/20 1250 12/01/20 1650  GLUCAP 158* 96 77 81 118*    Lipid Profile: No results for input(s): CHOL, HDL, LDLCALC, TRIG, CHOLHDL, LDLDIRECT in the last 72 hours. Thyroid Function Tests: No results for input(s): TSH, T4TOTAL, FREET4, T3FREE, THYROIDAB in the last 72 hours. Anemia Panel: No results for input(s): VITAMINB12, FOLATE, FERRITIN, TIBC, IRON, RETICCTPCT in the last 72 hours. Sepsis Labs: No results for input(s): PROCALCITON, LATICACIDVEN in  the last 168 hours.  No results found for this or any previous visit (from the past 240 hour(s)).       Radiology Studies: EEG adult  Result Date: 12-24-20 [name], MD     2020-12-24  3:39 PM Patient Name: Beverly Li MRN: [id number] Epilepsy Attending: [name] Referring Physician/Provider: [name] Date: 2020/12/24 Duration: 21.29 mins Patient history: 50 old female with left ACA/MCA infarct.  Had a seizure-like episode lasting about 30 seconds.  EEG to evaluate for seizures. Level of alertness: Awake AEDs during EEG study: None Technical aspects: This EEG study was done with scalp electrodes positioned according to the 10-20 International system of electrode placement. Electrical activity was acquired at a sampling rate of  500Hz  and reviewed with a high frequency filter of 70Hz  and a low frequency filter of 1Hz . EEG data were recorded continuously and digitally stored. Description: The posterior dominant rhythm consists of 8 Hz activity of moderate voltage (25-35 uV) seen predominantly in posterior head regions, symmetric and reactive to eye opening and eye closing. EEG showed continuous 5 to 6 Hz theta as well as intermittent 2 to 3 Hz delta slowing in left hemisphere.  Hyperventilation and photic stimulation were not performed.   ABNORMALITY - Continuous slow, left hemisphere IMPRESSION: This study is suggestive of cortical dysfunction in left hemisphere, likely secondary to underlying strokes.  No seizures or definite epileptiform discharges were seen throughout the recording. If suspicion for ictal-interictal activity remains a concern, a prolonged study can be considered. [name] Yadav        Scheduled Meds:  [START ON 12/06/2020] amiodarone  200 mg Per Tube Daily   amiodarone  400 mg Per Tube BID   arformoterol  15 mcg Nebulization BID   budesonide (PULMICORT) nebulizer solution  0.5 mg Nebulization BID   buPROPion  75 mg Per Tube BID   chlorhexidine   15 mL Mouth Rinse BID   ezetimibe  10 mg Per Tube Daily   feeding supplement (PROSource TF)  45 mL Per Tube BID   free water  200 mL Per Tube Q4H   guaiFENesin  15 mL Per Tube Q6H   hydrALAZINE  50 mg Per Tube BID   insulin aspart  0-9 Units Subcutaneous Q4H   mouth rinse  15 mL Mouth Rinse q12n4p   metoprolol tartrate  12.5 mg Per Tube BID   pantoprazole sodium  40 mg Per Tube QHS   revefenacin  175 mcg Nebulization Daily   Continuous Infusions:  feeding supplement (JEVITY 1.2 CAL) 1,000 mL (12/01/20 1517)   heparin 950 Units/hr (12/01/20 1439)     LOS: 18 days    Time spent:35 mins. More than 50% of that time was spent in counseling and/or coordination of care.      Barnetta ChapelSylvester I Kaleem Sartwell, MD [facility] P8/19/2022, 5:05 PM

## 2020-12-01 NOTE — Anesthesia Preprocedure Evaluation (Addendum)
Anesthesia Evaluation  Patient identified by MRN, date of birth, ID band Patient awake    Reviewed: Allergy & Precautions, NPO status , Patient's Chart, lab work & pertinent test results  Airway Mallampati: II  TM Distance: >3 FB Neck ROM: Full   Comment: Unable to assess; patient obtunded and non-communicative Dental no notable dental hx.    Pulmonary COPD, former smoker,    Pulmonary exam normal breath sounds clear to auscultation       Cardiovascular hypertension, + CAD and + Peripheral Vascular Disease  Normal cardiovascular exam+ dysrhythmias Atrial Fibrillation + Valvular Problems/Murmurs AS  Rhythm:Irregular Rate:Normal     Neuro/Psych PSYCHIATRIC DISORDERS Anxiety  Neuromuscular disease (Bell's palsy)    GI/Hepatic Neg liver ROS, GERD  ,  Endo/Other  negative endocrine ROS  Renal/GU negative Renal ROS  negative genitourinary   Musculoskeletal  (+) Arthritis ,   Abdominal   Peds  Hematology negative hematology ROS (+)   Anesthesia Other Findings Diffuse hematomas over the entirety of her abdomen, breast, hips, legs (unknown cause)  Reproductive/Obstetrics negative OB ROS                             Anesthesia Physical  Anesthesia Plan  ASA: 4  Anesthesia Plan: MAC   Post-op Pain Management:    Induction: Intravenous  PONV Risk Score and Plan: 2 and Ondansetron, Midazolam and Treatment may vary due to age or medical condition  Airway Management Planned: Simple Face Mask  Additional Equipment:   Intra-op Plan:   Post-operative Plan:   Informed Consent:     Only emergency history available  Plan Discussed with:   Anesthesia Plan Comments: ( )       Anesthesia Quick Evaluation

## 2020-12-01 NOTE — TOC Progression Note (Signed)
Transition of Care [facility]) - Progression Note    Patient Details  Name: Beverly Li MRN: [id number] Date of Birth: 08-07-1945  Transition of [facility]) CM/SW Contact  Baldemar Lenis, [place] Phone Number: 12/01/2020, 4:26 PM  Clinical Narrative:   CSW spoke with daughter in law and husband throughout the day to work on discharge planning. CSW sent referral to [facility] per family request, and checked to make sure that CIR still was not an option. Patient still not at a level where she could tolerate CIR. CSW updated husband at bedside, who was appreciative of update. Family is continuing to research bed offers available, and will update CSW when a decision is made on SNF choice. CSW to follow.      Barriers to Discharge: Continued Medical Work up  Expected Discharge Plan and Services   In-house Referral: Clinical Social Work   Post Acute Care Choice: Skilled Nursing Facility Living arrangements for the past 2 months: Single Family Home                                       Social Determinants of Health (SDOH) Interventions    Readmission Risk Interventions No flowsheet data found.

## 2020-12-01 NOTE — Progress Notes (Signed)
Trauma/Critical Care Follow Up Note  Subjective:    Overnight Issues:   Objective:  Vital signs for last 24 hours: Temp:  [97.2 F (36.2 C)-98.8 F (37.1 C)] 97.2 F (36.2 C) (08/19 0846) Pulse Rate:  [53-59] 57 (08/19 0846) Resp:  [18-20] 19 (08/19 0846) BP: (129-169)/(62-81) 169/75 (08/19 0846) SpO2:  [93 %-99 %] 97 % (08/19 0846) Weight:  [52.1 kg] 52.1 kg (08/19 0500)  Hemodynamic parameters for last 24 hours:    Intake/Output from previous day: 08/18 0701 - 08/19 0700 In: 262.6 [I.V.:262.6] Out: 280 [Urine:280]  Intake/Output this shift: No intake/output data recorded.  Vent settings for last 24 hours:    Physical Exam:  Gen: comfortable, no distress Neuro: non-focal exam HEENT: PERRL Neck: supple CV: RRR Pulm: unlabored breathing Abd: soft, NT GU: clear yellow urine Extr: wwp, no edema   Results for orders placed or performed during the hospital encounter of 11/13/20 (from the past 24 hour(s))  Glucose, capillary     Status: Abnormal   Collection Time: 11/30/20 12:15 PM  Result Value Ref Range   Glucose-Capillary 143 (H) 70 - 99 mg/dL  Heparin level (unfractionated)     Status: Abnormal   Collection Time: 11/30/20  1:01 PM  Result Value Ref Range   Heparin Unfractionated >1.10 (H) 0.30 - 0.70 IU/mL  APTT     Status: Abnormal   Collection Time: 11/30/20  1:01 PM  Result Value Ref Range   aPTT 80 (H) 24 - 36 seconds  Glucose, capillary     Status: Abnormal   Collection Time: 11/30/20  4:44 PM  Result Value Ref Range   Glucose-Capillary 104 (H) 70 - 99 mg/dL   Comment 1 Notify RN    Comment 2 Document in Chart   Glucose, capillary     Status: Abnormal   Collection Time: 11/30/20  7:43 PM  Result Value Ref Range   Glucose-Capillary 148 (H) 70 - 99 mg/dL  Glucose, capillary     Status: Abnormal   Collection Time: 12/01/20 12:19 AM  Result Value Ref Range   Glucose-Capillary 158 (H) 70 - 99 mg/dL  Glucose, capillary     Status: None    Collection Time: 12/01/20  4:05 AM  Result Value Ref Range   Glucose-Capillary 96 70 - 99 mg/dL  CBC     Status: Abnormal   Collection Time: 12/01/20  5:16 AM  Result Value Ref Range   WBC 13.2 (H) 4.0 - 10.5 K/uL   RBC 3.84 (L) 3.87 - 5.11 MIL/uL   Hemoglobin 12.0 12.0 - 15.0 g/dL   HCT 38.4 66.5 - 99.3 %   MCV 102.3 (H) 80.0 - 100.0 fL   MCH 31.3 26.0 - 34.0 pg   MCHC 30.5 30.0 - 36.0 g/dL   RDW 57.0 (H) 17.7 - 93.9 %   Platelets 301 150 - 400 K/uL   nRBC 0.0 0.0 - 0.2 %  APTT     Status: Abnormal   Collection Time: 12/01/20  5:16 AM  Result Value Ref Range   aPTT 77 (H) 24 - 36 seconds  Heparin level (unfractionated)     Status: Abnormal   Collection Time: 12/01/20  5:16 AM  Result Value Ref Range   Heparin Unfractionated >1.10 (H) 0.30 - 0.70 IU/mL  Basic metabolic panel     Status: Abnormal   Collection Time: 12/01/20  5:16 AM  Result Value Ref Range   Sodium 149 (H) 135 - 145 mmol/L   Potassium 3.8  3.5 - 5.1 mmol/L   Chloride 109 98 - 111 mmol/L   CO2 33 (H) 22 - 32 mmol/L   Glucose, Bld 93 70 - 99 mg/dL   BUN 49 (H) 8 - 23 mg/dL   Creatinine, Ser 7.16 0.44 - 1.00 mg/dL   Calcium 8.6 (L) 8.9 - 10.3 mg/dL   GFR, Estimated >96 >78 mL/min   Anion gap 7 5 - 15    Assessment & Plan:  Present on Admission:  Stroke [facility])  Middle cerebral artery embolism, left    LOS: 18 days   Additional comments:I reviewed the patient's new clinical lab test results.   and I reviewed the patients new imaging test results.    CVA with dysphagia  Consult for PEG placement  - SLP following - and patient remains NPO per their recs, she is getting TF and tolerating this fine - she does have an abdominal wall hematoma secondary to coughing PTA - PEG today, heparin gtt held since 0500   FEN: NPO, TF via cortrak @ goal, held since MN VTE: LD eliquis this AM - transitioned to heparin gtt, held as above ID: no current abx   peripheral vascular disease A. Fib HTN hypertrophic  cardiomyopathy R ICA stenosis GERD Hx of Bell's Palsy at age 81  anxiety   Diamantina Monks, MD Trauma & General Surgery Please use [email or web address] to contact on call provider  12/01/2020  *Care during the described time interval was provided by me. I have reviewed this patient's available data, including medical history, events of note, physical examination and test results as part of my evaluation.

## 2020-12-01 NOTE — Progress Notes (Signed)
Dr. Dartha Lodge notified about patient going back into a.fib with a rate of 98-110.

## 2020-12-01 NOTE — Progress Notes (Signed)
PT Cancellation Note  Patient Details Name: Beverly Li MRN: [id number] DOB: November 28, 1945   Cancelled Treatment:    Reason Eval/Treat Not Completed: Fatigue/lethargy limiting ability to participate;Other (comment) Pt with procedure (PEG placement) earlier this morning and presented with increased lethargy following anesthesia. Will continue to follow and re-attempt when pt more alert to maximize pt ability to participate in session.   Lazarus Gowda, PT, DPT   Acute Rehabilitation Department Pager #: [phone]   Ronnie Derby 12/01/2020, 4:55 PM

## 2020-12-01 NOTE — Transfer of Care (Signed)
Immediate Anesthesia Transfer of Care Note  Patient: CHRISTIANN HAGERTY  Procedure(s) Performed: ESOPHAGOGASTRODUODENOSCOPY (EGD) WITH PROPOFOL PERCUTANEOUS ENDOSCOPIC GASTROSTOMY (PEG) PLACEMENT  Patient Location: Endoscopy Unit  Anesthesia Type:MAC  Level of Consciousness: awake Pt at preop baseline  Airway & Oxygen Therapy: Patient Spontanous Breathing and Patient connected to nasal cannula oxygen  Post-op Assessment: Report given to RN and Post -op Vital signs reviewed and stable  Post vital signs: Reviewed and stable  Last Vitals:  Vitals Value Taken Time  BP    Temp    Pulse    Resp    SpO2      Last Pain:  Vitals:   12/01/20 0846  TempSrc: Temporal  PainSc:          Complications: No notable events documented.

## 2020-12-02 DIAGNOSIS — R0603 Acute respiratory distress: Secondary | ICD-10-CM

## 2020-12-02 DIAGNOSIS — R069 Unspecified abnormalities of breathing: Secondary | ICD-10-CM

## 2020-12-02 DIAGNOSIS — R0602 Shortness of breath: Secondary | ICD-10-CM

## 2020-12-02 DIAGNOSIS — R0902 Hypoxemia: Secondary | ICD-10-CM

## 2020-12-02 DIAGNOSIS — Z4659 Encounter for fitting and adjustment of other gastrointestinal appliance and device: Secondary | ICD-10-CM | POA: Diagnosis not present

## 2020-12-02 DIAGNOSIS — I63512 Cerebral infarction due to unspecified occlusion or stenosis of left middle cerebral artery: Secondary | ICD-10-CM | POA: Diagnosis not present

## 2020-12-02 LAB — RENAL FUNCTION PANEL
Albumin: 2.4 g/dL — ABNORMAL LOW (ref 3.5–5.0)
Anion gap: 7 (ref 5–15)
BUN: 51 mg/dL — ABNORMAL HIGH (ref 8–23)
CO2: 25 mmol/L (ref 22–32)
Calcium: 8.4 mg/dL — ABNORMAL LOW (ref 8.9–10.3)
Chloride: 108 mmol/L (ref 98–111)
Creatinine, Ser: 0.79 mg/dL (ref 0.44–1.00)
GFR, Estimated: >60 mL/min (ref >60)
Glucose, Bld: 181 mg/dL — ABNORMAL HIGH (ref 70–99)
Phosphorus: 3 mg/dL (ref 2.5–4.6)
Potassium: 4.2 mmol/L (ref 3.5–5.1)
Sodium: 140 mmol/L (ref 135–145)

## 2020-12-02 LAB — CBC
HCT: 38.9 % (ref 36.0–46.0)
Hemoglobin: 12.2 g/dL (ref 12.0–15.0)
MCH: 31.2 pg (ref 26.0–34.0)
MCHC: 31.4 g/dL (ref 30.0–36.0)
MCV: 99.5 fL (ref 80.0–100.0)
Platelets: 271 10*3/uL (ref 150–400)
RBC: 3.91 MIL/uL (ref 3.87–5.11)
RDW: 17.6 % — ABNORMAL HIGH (ref 11.5–15.5)
WBC: 19 10*3/uL — ABNORMAL HIGH (ref 4.0–10.5)
nRBC: 0 % (ref 0.0–0.2)

## 2020-12-02 LAB — APTT: aPTT: 46 seconds — ABNORMAL HIGH (ref 24–36)

## 2020-12-02 LAB — HEPARIN LEVEL (UNFRACTIONATED): Heparin Unfractionated: 0.57 IU/mL (ref 0.30–0.70)

## 2020-12-02 LAB — GLUCOSE, CAPILLARY
Glucose-Capillary: 154 mg/dL — ABNORMAL HIGH (ref 70–99)
Glucose-Capillary: 175 mg/dL — ABNORMAL HIGH (ref 70–99)
Glucose-Capillary: 189 mg/dL — ABNORMAL HIGH (ref 70–99)
Glucose-Capillary: 192 mg/dL — ABNORMAL HIGH (ref 70–99)
Glucose-Capillary: 194 mg/dL — ABNORMAL HIGH (ref 70–99)
Glucose-Capillary: 199 mg/dL — ABNORMAL HIGH (ref 70–99)

## 2020-12-02 LAB — MAGNESIUM: Magnesium: 2.2 mg/dL (ref 1.7–2.4)

## 2020-12-02 LAB — PROCALCITONIN: Procalcitonin: 0.2 ng/mL

## 2020-12-02 MED ORDER — APIXABAN 5 MG PO TABS: 5.0000 mg | ORAL_TABLET | Freq: Two times a day (BID) | ORAL | Status: DC

## 2020-12-02 MED ORDER — APIXABAN 5 MG PO TABS
5.0000 mg | ORAL_TABLET | Freq: Two times a day (BID) | ORAL | Status: DC
  Filled 2020-12-02: qty 1

## 2020-12-02 MED ORDER — SODIUM CHLORIDE 0.9 % IV SOLN
2.0000 g | Freq: Two times a day (BID) | INTRAVENOUS | Status: DC
  Administered 2020-12-02 – 2020-12-05 (×7): 2 g via INTRAVENOUS
  Filled 2020-12-02 (×7): qty 2

## 2020-12-02 MED ORDER — FREE WATER
300.0000 mL | Status: DC
  Administered 2020-12-02 – 2020-12-03 (×8): 300 mL

## 2020-12-02 MED ORDER — APIXABAN 5 MG PO TABS
5.0000 mg | ORAL_TABLET | Freq: Two times a day (BID) | ORAL | Status: DC
  Administered 2020-12-02 – 2020-12-06 (×8): 5 mg
  Filled 2020-12-02 (×7): qty 1

## 2020-12-02 NOTE — Progress Notes (Signed)
Pharmacy Antibiotic Note  Beverly Li is a 75 y.o. female admitted on 11/13/2020 with stroke. Pt now with pneumonia.  Pharmacy has been consulted for Cefepime dosing.  Plan: Cefepime 2gm IV q12h Will f/u renal function, micro data, and pt's clinical condition    Height: 5\' 3"  (160 cm) Weight: 52.1 kg (114 lb 13.8 oz) IBW/kg (Calculated) : 52.4  Temp (24hrs), Avg:98.2 F (36.8 C), Min:97.2 F (36.2 C), Max:98.7 F (37.1 C)  Recent Labs  Lab 11/27/20 0142 11/28/20 0045 11/29/20 0128 11/30/20 0226 11/30/20 0826 12/01/20 0516  WBC 17.5* 16.9* 14.0* 14.2*  --  13.2*  CREATININE 0.88 0.81 0.75  --  0.80 0.77    Estimated Creatinine Clearance: 50 mL/min (by C-G formula based on SCr of 0.77 mg/dL).    Allergies  Allergen Reactions   Amoxicillin Other (See Comments)    UTI Has patient had a PCN reaction causing immediate rash, facial/tongue/throat swelling, SOB or lightheadedness with hypotension: No Has patient had a PCN reaction causing severe rash involving mucus membranes or skin necrosis: No Has patient had a PCN reaction that required hospitalization: No Has patient had a PCN reaction occurring within the last 10 years: Yes--UTI ONLY If all of the above answers are "NO", then may proceed with Cephalosporin use.    Atenolol Cough   Crestor [Rosuvastatin Calcium] Other (See Comments)    Muscle aches   Pravastatin Other (See Comments)    Muscle aches   Sulfa Antibiotics Nausea Only   Codeine     hallucinations    Antimicrobials this admission: 8/20 Cefepime >>   Microbiology results: Pending  Thank you for allowing pharmacy to be a part of this patient's care.  9/20, PharmD, BCPS Please see amion for complete clinical pharmacist phone list 12/02/2020 1:18 AM

## 2020-12-02 NOTE — Progress Notes (Signed)
Progress Note  1 Day Post-Op  Subjective: Non-verbal. Was getting cleaned up after having BM.   Objective: Vital signs in last 24 hours: Temp:  [98 F (36.7 C)-98.7 F (37.1 C)] 98 F (36.7 C) (08/20 0736) Pulse Rate:  [57-95] 92 (08/20 0736) Resp:  [15-28] 20 (08/20 0736) BP: (90-174)/(58-82) 149/78 (08/20 0736) SpO2:  [91 %-99 %] 99 % (08/20 0736) Weight:  [53.4 kg] 53.4 kg (08/20 0456) Last BM Date: 12/02/20  Intake/Output from previous day: 08/19 0701 - 08/20 0700 In: 617.2 [I.V.:517.2; IV Piggyback:100] Out: 807 [Urine:801; Stool:1; Blood:5] Intake/Output this shift: No intake/output data recorded.  PE: Abd: soft, NT, ND, +BS, PEG in place without any bleeding around, abdominal binder loosely around and reapplied after abdominal exam    Lab Results:  Recent Labs    12/01/20 0516 12/02/20 0216  WBC 13.2* 19.0*  HGB 12.0 12.2  HCT 39.3 38.9  PLT 301 271   BMET Recent Labs    12/01/20 0516 12/02/20 0216  NA 149* 140  K 3.8 4.2  CL 109 108  CO2 33* 25  GLUCOSE 93 181*  BUN 49* 51*  CREATININE 0.77 0.79  CALCIUM 8.6* 8.4*   PT/INR No results for input(s): LABPROT, INR in the last 72 hours. CMP     Component Value Date/Time   NA 140 12/02/2020 0216   NA 144 08/16/2020 1314   K 4.2 12/02/2020 0216   CL 108 12/02/2020 0216   CO2 25 12/02/2020 0216   GLUCOSE 181 (H) 12/02/2020 0216   BUN 51 (H) 12/02/2020 0216   BUN 26 08/16/2020 1314   CREATININE 0.79 12/02/2020 0216   CALCIUM 8.4 (L) 12/02/2020 0216   PROT 5.7 (L) 11/13/2020 1345   ALBUMIN 2.4 (L) 12/02/2020 0216   AST 48 (H) 11/13/2020 1345   ALT 25 11/13/2020 1345   ALKPHOS 60 11/13/2020 1345   BILITOT 1.7 (H) 11/13/2020 1345   GFRNONAA >60 12/02/2020 0216   GFRAA 65 01/25/2020 0923   Lipase  No results found for: LIPASE     Studies/Results: DG CHEST PORT 1 VIEW  Result Date: 12/01/2020 CLINICAL DATA:  Shortness of breath EXAM: PORTABLE CHEST 1 VIEW COMPARISON:  11/25/2020  FINDINGS: Single frontal view of the chest demonstrates stable enlargement of the cardiac silhouette. The enteric catheter seen previously has been removed in the interim. There are bibasilar veiling opacities consistent with consolidation and small effusions, increased since prior exam. No evidence of pneumothorax. No acute bony abnormalities. IMPRESSION: 1. Bibasilar veiling opacities, slightly increased since prior study, consistent with underlying consolidation and/or effusions. Electronically Signed   By: Beverly Salina M.D.   On: 12/01/2020 18:13   EEG adult  Result Date: 11/30/2020 Beverly Quest, MD     11/30/2020  3:39 PM Patient Name: Beverly Li MRN: [id number] Epilepsy Attending: Charlsie Li Referring Physician/Provider: Dr Burnadette Li Date: 11/30/2020 Duration: 21.29 mins Patient history: 75 old female with left ACA/MCA infarct.  Had a seizure-like episode lasting about 30 seconds.  EEG to evaluate for seizures. Level of alertness: Awake AEDs during EEG study: None Technical aspects: This EEG study was done with scalp electrodes positioned according to the 10-20 International system of electrode placement. Electrical activity was acquired at a sampling rate of 500Hz  and reviewed with a high frequency filter of 70Hz  and a low frequency filter of 1Hz . EEG data were recorded continuously and digitally stored. Description: The posterior dominant rhythm consists of 8 Hz activity of moderate voltage (  25-35 uV) seen predominantly in posterior head regions, symmetric and reactive to eye opening and eye closing. EEG showed continuous 5 to 6 Hz theta as well as intermittent 2 to 3 Hz delta slowing in left hemisphere.  Hyperventilation and photic stimulation were not performed.   ABNORMALITY - Continuous slow, left hemisphere IMPRESSION: This study is suggestive of cortical dysfunction in left hemisphere, likely secondary to underlying strokes.  No seizures or definite epileptiform discharges were  seen throughout the recording. If suspicion for ictal-interictal activity remains a concern, a prolonged study can be considered. Beverly Li    Anti-infectives: Anti-infectives (From admission, onward)    Start     Dose/Rate Route Frequency Ordered Stop   12/02/20 0200  ceFEPIme (MAXIPIME) 2 g in sodium chloride 0.9 % 100 mL IVPB        2 g 200 mL/hr over 30 Minutes Intravenous Every 12 hours 12/02/20 0122     11/13/20 1423  ceFAZolin (ANCEF) 2-4 GM/100ML-% IVPB       Note to Pharmacy: Beverly Li: cabinet override      11/13/20 1423 11/14/20 0229        Assessment/Plan CVA with dysphagia  S/P PEG placement 12/01/20 Beverly Li - tolerating TF and having bowel function - PEG clean and no bleeding from incision - continue abdominal binder loosely - PEG to remain in place minimum of 8 weeks, can follow up PRN if she gets to a point where removal can be considered - we will sign off, please call if we can be of further assistance   FEN: TF at goal VTE: ok to transition back to PO anticoagulation  ID: no current abx   peripheral vascular disease A. Fib HTN hypertrophic cardiomyopathy R ICA stenosis GERD Hx of Bell's Palsy at age 75  anxiety  LOS: 19 days    Beverly Li, [facility] 12/02/2020, 10:47 AM Please see Amion for pager number during day hours 7:00am-4:30pm

## 2020-12-02 NOTE — Progress Notes (Signed)
ANTICOAGULATION CONSULT NOTE - Follow Up Consult  Pharmacy Consult for Heparin Indication: atrial fibrillation and stroke  Allergies  Allergen Reactions   Amoxicillin Other (See Comments)    UTI Has patient had a PCN reaction causing immediate rash, facial/tongue/throat swelling, SOB or lightheadedness with hypotension: No Has patient had a PCN reaction causing severe rash involving mucus membranes or skin necrosis: No Has patient had a PCN reaction that required hospitalization: No Has patient had a PCN reaction occurring within the last 10 years: Yes--UTI ONLY If all of the above answers are "NO", then may proceed with Cephalosporin use.    Atenolol Cough   Crestor [Rosuvastatin Calcium] Other (See Comments)    Muscle aches   Pravastatin Other (See Comments)    Muscle aches   Sulfa Antibiotics Nausea Only   Codeine     hallucinations    Patient Measurements: Height: 5\' 3"  (160 cm) Weight: 53.4 kg (117 lb 11.6 oz) IBW/kg (Calculated) : 52.4 kg Heparin Dosing Weight: 49.5 kg  Vital Signs: Temp: 98 F (36.7 C) (08/20 0736) Temp Source: Oral (08/20 0329) BP: 149/78 (08/20 0736) Pulse Rate: 92 (08/20 0736)  Labs: Recent Labs    11/30/20 0226 11/30/20 0826 11/30/20 1301 12/01/20 0516 12/02/20 0216  HGB 11.9*  --   --  12.0 12.2  HCT 39.6  --   --  39.3 38.9  PLT 338  --   --  301 271  APTT 67*  --  80* 77* 46*  HEPARINUNFRC >1.10*  --  >1.10* >1.10* 0.57  CREATININE  --  0.80  --  0.77 0.79    Estimated Creatinine Clearance: 50.3 mL/min (by C-G formula based on SCr of 0.79 mg/dL).   Assessment: 75 yo female on apixaban for atrial fibrillation PTA presents with stroke. PTA the patient's apixaban was on hold due to hematoma. The patient also has an abdominal hematoma that is significantly improving along with a history of R femoropopliteal bypass graft stenosis. The patient was switched from apixaban (last dose 8/16 in the AM) to heparin for PEG tube placement  which occurred on 8/19. Pharmacy is consulted to dose heparin.  aPTT is subtherapeutic at 46 while running at 950 units/hr. Will continue to monitor therapy with aPTT until the aPTTs begin to correlate with the heparin levels. Per the RN, there have been no issues with the infusion and the patient is without signs or symptoms of bleeding.  Goal of Therapy:  Heparin level 0.3-0.5 units/ml aPTT 66-85 seconds Monitor platelets by anticoagulation protocol: Yes   Plan:  Increase heparin IV to 1050 units/hr Obtain an aPTT in 8 hours Monitor for signs and symptoms of bleeding Obtain a daily aPTT, heparin level, and CBC Follow plans to resume PTA apixaban  9/19, PharmD, RPh  PGY-2 Pharmacy Resident 12/02/2020 8:41 AM  Please check [email or web address] for unit-specific pharmacy phone numbers.

## 2020-12-02 NOTE — Progress Notes (Signed)
PROGRESS NOTE    Beverly Li  YSA:[id number] DOB: [birth year] DOA: 11/13/2020 PCP: Clayborn Heron, MD   Chief Complain: Right-sided weakness, facial droop  Brief Narrative: Patient is a 75 year old female with history of A. fib, COPD, tobacco abuse, hypertrophic cardiomyopathy, aortic stenosis, peripheral artery disease who initially presented here on 8/7 with right-sided weakness, facial droop, left gaze preference.  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  Neurology/pccm  was consulted.  She had to be intubated on presentation.  She underwent thrombectomy with TICI 3 flow to left M1 segment.  Hospital course was remarkable for persistent aphasia, dysphagia.  She was transferred from [facility] service to hospitalist team on 8/16.  She was started on tube feeding for persistent dysphagia.  Plan for PEG placement on 8/19.  Eventually plan is to get discharged to skilled nursing facility.  Important events:  8/1 admitted, intubated, thrombectomy 8/2 Successful thrombectomy with complete revasculatization, TICI 3 flow. MRI with L ACA/MCA territory infarcts and multiple small punctate infarcts. Extubated late in day to [place]. 8/3 Afib on tele. Amio load + gtt started. Cortrak placement. 8/4 Slow improvement in neuro status, following more commands (LUE/LLE), tracking consistently, working with PT/OT/SLP. 8/5 patient converted to sinus rhythm, cardiology is following on amiodarone infusion 8/6 Patient remained in sinus bradycardia, cardiology recommended stopping beta-blocker and switching amiodarone to p.o 8/7 Started with acute urinary retention, requiring straight cath twice. Afib with RVR. CXR with LLL collapse. Increased O2 requirement, could not tolerate BiPAP. Transitioned to [facility] 30L/30% FiO2. 8/8 Improving respiratory status, still volume up, working with therapies. 8/9 Weaned down to nasal cannula, tolerating well. Moving LUE/LLE purposefully. Following commands. Remains globally aphasic.  Working with PT/OT. BNP 1200, additional diuresis today. CXR with atelectasis. 8/10 Desaturations overnight/early AM to 81%. 3L [place] -> HFNC -> Venturi mask. CXR with small bilateral pleural effusions and atelectasis, improved from prior. Suspect desats mainly positional. Lasix 40mg  IV x 1 given. 8/11 diuresis stopped 2/2 contraction alkalosis   12/01/2020: Patient seen alongside patient's husband.  Also discussed with the patient's nurse extensively.  Patient's pattern noted.  Patient's husband turning ophthalmologist patient was clearly over perspiring.  Patient is new to me.  Patient has history of COPD.  Patient is aphasic.  No history from patient.  No documented fever.  Patient has leukocytosis, though, improving slightly.  Patient is already on neb treatment.  Patient underwent PEG placement earlier today.  Patient reevaluated towards the end of the day as patient's husband was worried that the patient is breathing.  Stat ABG and chest x-ray were ordered.  We will have low threshold to start patient on antibiotics.  We will also have a low threshold to start patient on IV Solu-Medrol.  12/02/2020: Patient seen alongside patient's husband and son.  Patient looks a lot better today.  Respiratory status has improved significantly.  Patient is on IV cefepime for likely aspiration pneumonia.  Overall, patient looks more stable today.  Updated patient's husband and son.    Assessment & Plan:   Active Problems:   Obstructive hypertrophic cardiomyopathy (HCC)   Stroke (HCC)   Middle cerebral artery embolism, left   Protein-calorie malnutrition, severe   Pressure injury of skin   Left ACA/MCA territory infarcts with scattered punctuate right frontoparietal cortical and right cerebellar infarcts: Neurology was consulted and following.  Status post MCA thrombectomy with TICI 3.  Stroke was suspected to be embolic. She was taking Eliquis for anticoagulation for A. fib  at home  but it was held due to  abdominal hematoma.  Echo showed ejection fraction of 60 to 65%, severely dilated left atrium.  LDL of 120.  Hemoglobin A1 C of 6. She is globally aphasic, has dense right hemiplegia and dysphagia. 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  Noted to have delayed string sign on cerebral angiogram.  Follow-up with IR as an outpatient recommended. 12/02/2020: Neurology team is directing care.  Patient remains aphasic.  Acute hypoxic respiratory failure: Developed acute respiratory failure after CT scan, was bagged by ED physician on the way to IR for thrombectomy.  Patient was intubated and put on ventilator in IR.  Was on PCCM service.  Currently extubated.  On 2 L of oxygen per minute.  Continue to try to wean the oxygen. 12/01/2020: ABG reviewed.  Chest x-ray reviewed.  Patient is on supplemental oxygen.  IV Solu-Medrol 40 Mg every 12x2 doses are reviewed.  Low threshold to start patient on IV antibiotics.  Patient is high risk for aspiration. 12/02/2020: Patient is better today.  Respiratory status is stable.  Dysphagia/aphasia: Secondary to stroke.  Currently has a feeding tube.  Plan is to put a PEG tube by surgical team on 8/19 after Eliquis washout.  Continue heparin drip for now.  Paroxysmal A. fib with RVR: Converted to normal sinus rhythm on 8/8.  She was started on amiodarone.  Currently in sinus bradycardia with occasional junctional beat.  On metoprolol for rate control.  Currently on anticoagulation with heparin, plan is to restart Eliquis.  She was previously taking Eliquis but was held be due to abdominal hematoma.  Hypernatremia: Continue free water.  Monitor BMP 12/01/2020: Increase free water from 200 cc every 4 to 300 cc every 4 hourly.  Continue to monitor closely. 12/02/2020: Resolved.  Sodium is 140 today.  Leukocytosis: Continue monitoring.  No clear signs of infection 12/01/2020: Repeat chest x-ray reveals bibasilar consolidation.  Low threshold to start patient on IV  antibiotics. 12/02/2020: Worsening leukocytosis.  WBC is 19 today.  Patient also received some steroids.  Continue to trend.  Right femoropopliteal bypass graft stenosis: Vascular surgery was following.  Recommend outpatient follow-up.  Hypertension: Continue to monitor blood pressure.  On hydralazine and metoprolol.  Hyperlipidemia: Continue Zetia.  LDL of 120.  Abdominal hematoma: Reportedly developed after coughing 2 weeks prior to admission..Significantly improved.  Debility/deconditioning: PT/OT recommended skilled nursing facility on discharge.  TOC following   Nutrition Problem: Severe Malnutrition Etiology: chronic illness (COPD)      DVT prophylaxis:Heparin IV Code Status: Full Family Communication: Husband and daughter in law at bedside Status is: Inpatient  Remains inpatient appropriate because:Inpatient level of care appropriate due to severity of illness  Dispo: The patient is from: Home              Anticipated d/c is to: SNF              Patient currently is not medically stable to d/c.   Difficult to place patient No   Consultants: Neurology, IR, PCCM  Procedures: As above  Antimicrobials:  Anti-infectives (From admission, onward)    Start     Dose/Rate Route Frequency Ordered Stop   12/02/20 0200  ceFEPIme (MAXIPIME) 2 g in sodium chloride 0.9 % 100 mL IVPB        2 g 200 mL/hr over 30 Minutes Intravenous Every 12 hours 12/02/20 0122     11/13/20 1423  ceFAZolin (ANCEF) 2-4 GM/100ML-% IVPB  Note to Pharmacy: [name]: cabinet override      11/13/20 1423 11/14/20 0229       Subjective: Aphasic. No history from patient. Patient looks a lot better today.  Objective: Vitals:   12/02/20 0456 12/02/20 0728 12/02/20 0736 12/02/20 1103  BP:   (!) 149/78 133/80  Pulse:   92   Resp:  (!) 22 20   Temp:   98 F (36.7 C) 98.2 F (36.8 C)  TempSrc:      SpO2:   99%   Weight: 53.4 kg     Height:        Intake/Output Summary  (Last 24 hours) at 12/02/2020 1139 Last data filed at 12/02/2020 0411 Gross per 24 hour  Intake 217.24 ml  Output 802 ml  Net -584.76 ml    Filed Weights   11/30/20 0431 12/01/20 0500 12/02/20 0456  Weight: 52.2 kg 52.1 kg 53.4 kg    Examination:  General exam: Not in any distress.  Looks better.   HEENT: No pallor or jaundice Respiratory system:  Decreased air entry globally Cardiovascular system: S1 & S2 heard, RRR.  Gastrointestinal system: Abdomen is non-distended. S/P PEG tube placement.   Central nervous system: Awake. Aphasic with dense right hemiplegia  Extremities: No edema    Data Reviewed: I have personally reviewed following labs and imaging studies  CBC: Recent Labs  Lab 11/28/20 0045 11/29/20 0128 11/30/20 0226 12/01/20 0516 12/02/20 0216  WBC 16.9* 14.0* 14.2* 13.2* 19.0*  HGB 12.7 12.7 11.9* 12.0 12.2  HCT 41.9 42.6 39.6 39.3 38.9  MCV 101.2* 102.9* 101.8* 102.3* 99.5  PLT 403* 388 338 301 271    Basic Metabolic Panel: Recent Labs  Lab 11/28/20 0045 11/29/20 0128 11/30/20 0826 12/01/20 0516 12/02/20 0216  NA 150* 151* 149* 149* 140  K 4.3 4.4 3.8 3.8 4.2  CL 115* 113* 113* 109 108  CO2 27 30 32 33* 25  GLUCOSE 95 140* 110* 93 181*  BUN 60* 60* 57* 49* 51*  CREATININE 0.81 0.75 0.80 0.77 0.79  CALCIUM 8.9 8.9 8.5* 8.6* 8.4*  MG  --  2.3  --   --  2.2  PHOS  --  3.4  --   --  3.0    GFR: Estimated Creatinine Clearance: 50.3 mL/min (by C-G formula based on SCr of 0.79 mg/dL). Liver Function Tests: Recent Labs  Lab 12/02/20 0216  ALBUMIN 2.4*   No results for input(s): LIPASE, AMYLASE in the last 168 hours. No results for input(s): AMMONIA in the last 168 hours. Coagulation Profile: No results for input(s): INR, PROTIME in the last 168 hours. Cardiac Enzymes: No results for input(s): CKTOTAL, CKMB, CKMBINDEX, TROPONINI in the last 168 hours. BNP (last 3 results) No results for input(s): PROBNP in the last 8760 hours. HbA1C: No  results for input(s): HGBA1C in the last 72 hours. CBG: Recent Labs  Lab 12/01/20 1805 12/01/20 2017 12/02/20 0023 12/02/20 0328 12/02/20 0800  GLUCAP 146* 176* 175* 199* 189*    Lipid Profile: No results for input(s): CHOL, HDL, LDLCALC, TRIG, CHOLHDL, LDLDIRECT in the last 72 hours. Thyroid Function Tests: No results for input(s): TSH, T4TOTAL, FREET4, T3FREE, THYROIDAB in the last 72 hours. Anemia Panel: No results for input(s): VITAMINB12, FOLATE, FERRITIN, TIBC, IRON, RETICCTPCT in the last 72 hours. Sepsis Labs: Recent Labs  Lab 12/02/20 0216  PROCALCITON 0.20    No results found for this or any previous visit (from the past 240 hour(s)).  Radiology Studies: DG CHEST PORT 1 VIEW  Result Date: 12/01/2020 CLINICAL DATA:  Shortness of breath EXAM: PORTABLE CHEST 1 VIEW COMPARISON:  11/25/2020 FINDINGS: Single frontal view of the chest demonstrates stable enlargement of the cardiac silhouette. The enteric catheter seen previously has been removed in the interim. There are bibasilar veiling opacities consistent with consolidation and small effusions, increased since prior exam. No evidence of pneumothorax. No acute bony abnormalities. IMPRESSION: 1. Bibasilar veiling opacities, slightly increased since prior study, consistent with underlying consolidation and/or effusions. Electronically Signed   By: Sharlet SalinaMichael  Brown M.D.   On: 12/01/2020 18:13   EEG adult  Result Date: 11/30/2020 Charlsie QuestYadav, Priyanka O, MD     11/30/2020  3:39 PM Patient Name: Beverly Li MRN: [id number] Epilepsy Attending: Charlsie QuestPriyanka O Yadav Referring Physician/Provider: Dr Burnadette PopAmrit Adhikari Date: 11/30/2020 Duration: 21.29 mins Patient history: [age] old female with left ACA/MCA infarct.  Had a seizure-like episode lasting about 30 seconds.  EEG to evaluate for seizures. Level of alertness: Awake AEDs during EEG study: None Technical aspects: This EEG study was done with scalp electrodes positioned according to the  10-20 International system of electrode placement. Electrical activity was acquired at a sampling rate of 500Hz  and reviewed with a high frequency filter of 70Hz  and a low frequency filter of 1Hz . EEG data were recorded continuously and digitally stored. Description: The posterior dominant rhythm consists of 8 Hz activity of moderate voltage (25-35 uV) seen predominantly in posterior head regions, symmetric and reactive to eye opening and eye closing. EEG showed continuous 5 to 6 Hz theta as well as intermittent 2 to 3 Hz delta slowing in left hemisphere.  Hyperventilation and photic stimulation were not performed.   ABNORMALITY - Continuous slow, left hemisphere IMPRESSION: This study is suggestive of cortical dysfunction in left hemisphere, likely secondary to underlying strokes.  No seizures or definite epileptiform discharges were seen throughout the recording. If suspicion for ictal-interictal activity remains a concern, a prolonged study can be considered. [name] Yadav        Scheduled Meds:  [START ON 12/06/2020] amiodarone  200 mg Per Tube Daily   amiodarone  400 mg Per Tube BID   arformoterol  15 mcg Nebulization BID   budesonide (PULMICORT) nebulizer solution  0.5 mg Nebulization BID   buPROPion  75 mg Per Tube BID   chlorhexidine  15 mL Mouth Rinse BID   ezetimibe  10 mg Per Tube Daily   feeding supplement (PROSource TF)  45 mL Per Tube BID   free water  300 mL Per Tube Q4H   guaiFENesin  15 mL Per Tube Q6H   hydrALAZINE  50 mg Per Tube BID   insulin aspart  0-9 Units Subcutaneous Q4H   mouth rinse  15 mL Mouth Rinse q12n4p   metoprolol tartrate  12.5 mg Per Tube BID   pantoprazole sodium  40 mg Per Tube QHS   revefenacin  175 mcg Nebulization Daily   Continuous Infusions:  ceFEPime (MAXIPIME) IV 2 g (12/02/20 0216)   feeding supplement (JEVITY 1.2 CAL) 1,000 mL (12/01/20 1517)   heparin 1,050 Units/hr (12/02/20 0930)     LOS: 19 days    Time spent:25 mins. More than  50% of that time was spent in counseling and/or coordination of care.      Barnetta ChapelSylvester I Daley Gosse, MD [facility] P8/20/2022, 11:39 AM

## 2020-12-02 NOTE — Progress Notes (Signed)
Patient hasn't worn CPAP the past nights. Patient is nonverbal. Will continue O2 at 5 lpm. Vitals stable.

## 2020-12-02 NOTE — Plan of Care (Signed)
  Problem: Nutrition: Goal: Risk of aspiration will decrease Outcome: Progressing Goal: Dietary intake will improve Outcome: Progressing   Problem: Ischemic Stroke/TIA Tissue Perfusion: Goal: Complications of ischemic stroke/TIA will be minimized Outcome: Progressing   Problem: Clinical Measurements: Goal: Will remain free from infection Outcome: Progressing Goal: Diagnostic test results will improve Outcome: Progressing Goal: Respiratory complications will improve Outcome: Progressing Goal: Cardiovascular complication will be avoided Outcome: Progressing

## 2020-12-03 DIAGNOSIS — R0602 Shortness of breath: Secondary | ICD-10-CM | POA: Diagnosis not present

## 2020-12-03 DIAGNOSIS — R0603 Acute respiratory distress: Secondary | ICD-10-CM | POA: Diagnosis not present

## 2020-12-03 DIAGNOSIS — R069 Unspecified abnormalities of breathing: Secondary | ICD-10-CM | POA: Diagnosis not present

## 2020-12-03 DIAGNOSIS — I6602 Occlusion and stenosis of left middle cerebral artery: Secondary | ICD-10-CM | POA: Diagnosis not present

## 2020-12-03 LAB — GLUCOSE, CAPILLARY
Glucose-Capillary: 117 mg/dL — ABNORMAL HIGH (ref 70–99)
Glucose-Capillary: 124 mg/dL — ABNORMAL HIGH (ref 70–99)
Glucose-Capillary: 125 mg/dL — ABNORMAL HIGH (ref 70–99)
Glucose-Capillary: 126 mg/dL — ABNORMAL HIGH (ref 70–99)
Glucose-Capillary: 135 mg/dL — ABNORMAL HIGH (ref 70–99)
Glucose-Capillary: 146 mg/dL — ABNORMAL HIGH (ref 70–99)

## 2020-12-03 LAB — CBC
HCT: 38.2 % (ref 36.0–46.0)
Hemoglobin: 12.2 g/dL (ref 12.0–15.0)
MCH: 31.7 pg (ref 26.0–34.0)
MCHC: 31.9 g/dL (ref 30.0–36.0)
MCV: 99.2 fL (ref 80.0–100.0)
Platelets: 267 10*3/uL (ref 150–400)
RBC: 3.85 MIL/uL — ABNORMAL LOW (ref 3.87–5.11)
RDW: 17.8 % — ABNORMAL HIGH (ref 11.5–15.5)
WBC: 18.3 10*3/uL — ABNORMAL HIGH (ref 4.0–10.5)
nRBC: 0 % (ref 0.0–0.2)

## 2020-12-03 LAB — PROCALCITONIN: Procalcitonin: 0.16 ng/mL

## 2020-12-03 MED ORDER — FREE WATER
200.0000 mL | Status: DC
  Administered 2020-12-03 – 2020-12-06 (×18): 200 mL

## 2020-12-03 NOTE — Progress Notes (Signed)
PROGRESS NOTE    Beverly Li  DGU:[id number] DOB: August 12, 1945 DOA: 11/13/2020 PCP: Clayborn Heron, MD   Chief Complain: Right-sided weakness, facial droop  Brief Narrative: Patient is a 75 year old female with history of A. fib, COPD, tobacco abuse, hypertrophic cardiomyopathy, aortic stenosis, peripheral artery disease who initially presented here on 8/7 with right-sided weakness, facial droop, left gaze preference.  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  Neurology/pccm  was consulted.  She had to be intubated on presentation.  She underwent thrombectomy with TICI 3 flow to left M1 segment.  Hospital course was remarkable for persistent aphasia, dysphagia.  She was transferred from [facility] service to hospitalist team on 8/16.  She was started on tube feeding for persistent dysphagia.  Plan for PEG placement on 8/19.  Eventually plan is to get discharged to skilled nursing facility.  Important events:  8/1 admitted, intubated, thrombectomy 8/2 Successful thrombectomy with complete revasculatization, TICI 3 flow. MRI with L ACA/MCA territory infarcts and multiple small punctate infarcts. Extubated late in day to [place]. 8/3 Afib on tele. Amio load + gtt started. Cortrak placement. 8/4 Slow improvement in neuro status, following more commands (LUE/LLE), tracking consistently, working with PT/OT/SLP. 8/5 patient converted to sinus rhythm, cardiology is following on amiodarone infusion 8/6 Patient remained in sinus bradycardia, cardiology recommended stopping beta-blocker and switching amiodarone to p.o 8/7 Started with acute urinary retention, requiring straight cath twice. Afib with RVR. CXR with LLL collapse. Increased O2 requirement, could not tolerate BiPAP. Transitioned to [facility] 30L/30% FiO2. 8/8 Improving respiratory status, still volume up, working with therapies. 8/9 Weaned down to nasal cannula, tolerating well. Moving LUE/LLE purposefully. Following commands. Remains globally aphasic.  Working with PT/OT. BNP 1200, additional diuresis today. CXR with atelectasis. 8/10 Desaturations overnight/early AM to 81%. 3L [place] -> HFNC -> Venturi mask. CXR with small bilateral pleural effusions and atelectasis, improved from prior. Suspect desats mainly positional. Lasix 40mg  IV x 1 given. 8/11 diuresis stopped 2/2 contraction alkalosis   12/01/2020: Patient seen alongside patient's husband.  Also discussed with the patient's nurse extensively.  Patient's pattern noted.  Patient's husband turning ophthalmologist patient was clearly over perspiring.  Patient is new to me.  Patient has history of COPD.  Patient is aphasic.  No history from patient.  No documented fever.  Patient has leukocytosis, though, improving slightly.  Patient is already on neb treatment.  Patient underwent PEG placement earlier today.  Patient reevaluated towards the end of the day as patient's husband was worried that the patient is breathing.  Stat ABG and chest x-ray were ordered.  We will have low threshold to start patient on antibiotics.  We will also have a low threshold to start patient on IV Solu-Medrol.  12/02/2020: Patient seen alongside patient's husband and son.  Patient looks a lot better today.  Respiratory status has improved significantly.  Patient is on IV cefepime for likely aspiration pneumonia.  Overall, patient looks more stable today.  Updated patient's husband and son.  12/03/2020: Patient seen.  No new changes.  Patient remains stable.    Assessment & Plan:   Active Problems:   Obstructive hypertrophic cardiomyopathy (HCC)   SOB (shortness of breath)   Encounter for feeding tube placement   Stroke [facility])   Middle cerebral artery embolism, left   Protein-calorie malnutrition, severe   Pressure injury of skin   Hypoxia   Respiratory distress   Respiration abnormal   Left ACA/MCA territory infarcts with scattered punctuate right frontoparietal cortical  and right cerebellar infarcts: Neurology  was consulted and following.  Status post MCA thrombectomy with TICI 3.  Stroke was suspected to be embolic. She was taking Eliquis for anticoagulation for A. fib  at home but it was held due to abdominal hematoma.  Echo showed ejection fraction of 60 to 65%, severely dilated left atrium.  LDL of 120.  Hemoglobin A1 C of 6. She is globally aphasic, has dense right hemiplegia and dysphagia. 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  Noted to have delayed string sign on cerebral angiogram.  Follow-up with IR as an outpatient recommended. 12/03/2020: Neurology team is directing care.  Patient remains aphasic.  Acute hypoxic respiratory failure: Developed acute respiratory failure after CT scan, was bagged by ED physician on the way to IR for thrombectomy.  Patient was intubated and put on ventilator in IR.  Was on PCCM service.  Currently extubated.  On 2 L of oxygen per minute.  Continue to try to wean the oxygen. 12/01/2020: ABG reviewed.  Chest x-ray reviewed.  Patient is on supplemental oxygen.  IV Solu-Medrol 40 Mg every 12x2 doses are reviewed.  Low threshold to start patient on IV antibiotics.  Patient is high risk for aspiration. 12/03/2020: Improved significantly.  Respiratory status is stable.  Dysphagia/aphasia: Secondary to stroke.  Currently has a feeding tube.  Plan is to put a PEG tube by surgical team on 8/19 after Eliquis washout.  Continue heparin drip for now.  Paroxysmal A. fib with RVR: Converted to normal sinus rhythm on 8/8.  She was started on amiodarone.  Currently in sinus bradycardia with occasional junctional beat.  On metoprolol for rate control.  Currently on anticoagulation with heparin, plan is to restart Eliquis.  She was previously taking Eliquis but was held be due to abdominal hematoma.  Hypernatremia: Continue free water.  Monitor BMP 12/01/2020: Increase free water from 200 cc every 4 to 300 cc every 4 hourly.  Continue to monitor closely. 12/02/2020: Resolved.   Sodium is 140 today.  Leukocytosis: Continue monitoring.  No clear signs of infection 12/01/2020: Repeat chest x-ray reveals bibasilar consolidation.  Low threshold to start patient on IV antibiotics. 12/02/2020: Worsening leukocytosis.  WBC is 19 today.  Patient also received some steroids.  Continue to trend. 12/03/2020: WBC today is 18.3.  Right femoropopliteal bypass graft stenosis: Vascular surgery was following.  Recommend outpatient follow-up.  Hypertension: Continue to monitor blood pressure.  On hydralazine and metoprolol.  Hyperlipidemia: Continue Zetia.  LDL of 120.  Abdominal hematoma: Reportedly developed after coughing 2 weeks prior to admission..Significantly improved.  Debility/deconditioning: PT/OT recommended skilled nursing facility on discharge.  TOC following   Nutrition Problem: Severe Malnutrition Etiology: chronic illness (COPD)      DVT prophylaxis:Heparin IV Code Status: Full Family Communication: Husband and daughter in law at bedside Status is: Inpatient  Remains inpatient appropriate because:Inpatient level of care appropriate due to severity of illness  Dispo: The patient is from: Home              Anticipated d/c is to: SNF              Patient currently is not medically stable to d/c.   Difficult to place patient No   Consultants: Neurology, IR, PCCM  Procedures: As above  Antimicrobials:  Anti-infectives (From admission, onward)    Start     Dose/Rate Route Frequency Ordered Stop   12/02/20 0200  ceFEPIme (MAXIPIME) 2 g in sodium chloride 0.9 %  100 mL IVPB        2 g 200 mL/hr over 30 Minutes Intravenous Every 12 hours 12/02/20 0122     11/13/20 1423  ceFAZolin (ANCEF) 2-4 GM/100ML-% IVPB       Note to Pharmacy: [name]: cabinet override      11/13/20 1423 11/14/20 0229       Subjective: Aphasic. No history from patient. Patient looks a lot better today.  Objective: Vitals:   12/03/20 0716 12/03/20 0811 12/03/20  1136 12/03/20 1506  BP: (!) 140/99  123/82 138/76  Pulse: 79  71   Resp: 18  20   Temp: 97.6 F (36.4 C)  98.1 F (36.7 C) 98.3 F (36.8 C)  TempSrc: Axillary  Oral   SpO2: 100% 100%    Weight:      Height:        Intake/Output Summary (Last 24 hours) at 12/03/2020 1612 Last data filed at 12/03/2020 0610 Gross per 24 hour  Intake 4530 ml  Output 650 ml  Net 3880 ml    Filed Weights   12/01/20 0500 12/02/20 0456 12/03/20 0503  Weight: 52.1 kg 53.4 kg 55 kg    Examination:  General exam: Not in any distress.  Looks better.   HEENT: No pallor or jaundice Respiratory system: Significantly improved air entry with very minimal expiratory wheeze.   Cardiovascular system: S1 & S2 heard, RRR.  Gastrointestinal system: Abdomen is non-distended. S/P PEG tube placement.   Central nervous system: Awake. Aphasic with dense right hemiplegia  Extremities: No edema    Data Reviewed: I have personally reviewed following labs and imaging studies  CBC: Recent Labs  Lab 11/29/20 0128 11/30/20 0226 12/01/20 0516 12/02/20 0216 12/03/20 0215  WBC 14.0* 14.2* 13.2* 19.0* 18.3*  HGB 12.7 11.9* 12.0 12.2 12.2  HCT 42.6 39.6 39.3 38.9 38.2  MCV 102.9* 101.8* 102.3* 99.5 99.2  PLT 388 338 301 271 267    Basic Metabolic Panel: Recent Labs  Lab 11/28/20 0045 11/29/20 0128 11/30/20 0826 12/01/20 0516 12/02/20 0216  NA 150* 151* 149* 149* 140  K 4.3 4.4 3.8 3.8 4.2  CL 115* 113* 113* 109 108  CO2 27 30 32 33* 25  GLUCOSE 95 140* 110* 93 181*  BUN 60* 60* 57* 49* 51*  CREATININE 0.81 0.75 0.80 0.77 0.79  CALCIUM 8.9 8.9 8.5* 8.6* 8.4*  MG  --  2.3  --   --  2.2  PHOS  --  3.4  --   --  3.0    GFR: Estimated Creatinine Clearance: 50.3 mL/min (by C-G formula based on SCr of 0.79 mg/dL). Liver Function Tests: Recent Labs  Lab 12/02/20 0216  ALBUMIN 2.4*    No results for input(s): LIPASE, AMYLASE in the last 168 hours. No results for input(s): AMMONIA in the last 168  hours. Coagulation Profile: No results for input(s): INR, PROTIME in the last 168 hours. Cardiac Enzymes: No results for input(s): CKTOTAL, CKMB, CKMBINDEX, TROPONINI in the last 168 hours. BNP (last 3 results) No results for input(s): PROBNP in the last 8760 hours. HbA1C: No results for input(s): HGBA1C in the last 72 hours. CBG: Recent Labs  Lab 12/03/20 0046 12/03/20 0334 12/03/20 0818 12/03/20 1134 12/03/20 1605  GLUCAP 146* 125* 126* 124* 117*    Lipid Profile: No results for input(s): CHOL, HDL, LDLCALC, TRIG, CHOLHDL, LDLDIRECT in the last 72 hours. Thyroid Function Tests: No results for input(s): TSH, T4TOTAL, FREET4, T3FREE, THYROIDAB in the last  72 hours. Anemia Panel: No results for input(s): VITAMINB12, FOLATE, FERRITIN, TIBC, IRON, RETICCTPCT in the last 72 hours. Sepsis Labs: Recent Labs  Lab 12/02/20 0216 12/03/20 0215  PROCALCITON 0.20 0.16     No results found for this or any previous visit (from the past 240 hour(s)).       Radiology Studies: DG CHEST PORT 1 VIEW  Result Date: 12/01/2020 CLINICAL DATA:  Shortness of breath EXAM: PORTABLE CHEST 1 VIEW COMPARISON:  11/25/2020 FINDINGS: Single frontal view of the chest demonstrates stable enlargement of the cardiac silhouette. The enteric catheter seen previously has been removed in the interim. There are bibasilar veiling opacities consistent with consolidation and small effusions, increased since prior exam. No evidence of pneumothorax. No acute bony abnormalities. IMPRESSION: 1. Bibasilar veiling opacities, slightly increased since prior study, consistent with underlying consolidation and/or effusions. Electronically Signed   By: Sharlet SalinaMichael  Brown M.D.   On: 12/01/2020 18:13        Scheduled Meds:  [START ON 12/06/2020] amiodarone  200 mg Per Tube Daily   amiodarone  400 mg Per Tube BID   apixaban  5 mg Per Tube BID   arformoterol  15 mcg Nebulization BID   budesonide (PULMICORT) nebulizer solution   0.5 mg Nebulization BID   buPROPion  75 mg Per Tube BID   chlorhexidine  15 mL Mouth Rinse BID   ezetimibe  10 mg Per Tube Daily   feeding supplement (PROSource TF)  45 mL Per Tube BID   free water  200 mL Per Tube Q4H   guaiFENesin  15 mL Per Tube Q6H   hydrALAZINE  50 mg Per Tube BID   insulin aspart  0-9 Units Subcutaneous Q4H   mouth rinse  15 mL Mouth Rinse q12n4p   metoprolol tartrate  12.5 mg Per Tube BID   pantoprazole sodium  40 mg Per Tube QHS   revefenacin  175 mcg Nebulization Daily   Continuous Infusions:  ceFEPime (MAXIPIME) IV 2 g (12/03/20 1427)   feeding supplement (JEVITY 1.2 CAL) 1,000 mL (12/03/20 0615)     LOS: 20 days    Time spent:25 mins. More than 50% of that time was spent in counseling and/or coordination of care.      Beverly ChapelSylvester I Timofey Carandang, MD [facility] P8/21/2022, 4:12 PM

## 2020-12-03 NOTE — Plan of Care (Signed)
°  Problem: Education: °Goal: Knowledge of disease or condition will improve °Outcome: Progressing °Goal: Knowledge of secondary prevention will improve °Outcome: Progressing °Goal: Knowledge of patient specific risk factors addressed and post discharge goals established will improve °Outcome: Progressing °Goal: Individualized Educational Video(s) °Outcome: Progressing °  °Problem: Coping: °Goal: Will verbalize positive feelings about self °Outcome: Progressing °Goal: Will identify appropriate support needs °Outcome: Progressing °  °Problem: Health Behavior/Discharge Planning: °Goal: Ability to manage health-related needs will improve °Outcome: Progressing °  °

## 2020-12-03 NOTE — Plan of Care (Signed)
  Problem: Nutrition: Goal: Risk of aspiration will decrease Outcome: Progressing Goal: Dietary intake will improve Outcome: Progressing   

## 2020-12-03 NOTE — Progress Notes (Signed)
Patient has not been wearing CPAP. Patient is nonverbal. Pt tolerating 5L [place] well at this time. No respiratory distress noted. RT did not place pt on CPAP at this time. RT will continue to monitor.

## 2020-12-04 ENCOUNTER — Encounter (HOSPITAL_COMMUNITY): Payer: Self-pay | Admitting: Surgery

## 2020-12-04 DIAGNOSIS — I6602 Occlusion and stenosis of left middle cerebral artery: Secondary | ICD-10-CM | POA: Diagnosis not present

## 2020-12-04 LAB — GLUCOSE, CAPILLARY
Glucose-Capillary: 130 mg/dL — ABNORMAL HIGH (ref 70–99)
Glucose-Capillary: 130 mg/dL — ABNORMAL HIGH (ref 70–99)
Glucose-Capillary: 135 mg/dL — ABNORMAL HIGH (ref 70–99)
Glucose-Capillary: 136 mg/dL — ABNORMAL HIGH (ref 70–99)
Glucose-Capillary: 139 mg/dL — ABNORMAL HIGH (ref 70–99)
Glucose-Capillary: 140 mg/dL — ABNORMAL HIGH (ref 70–99)

## 2020-12-04 LAB — CBC
HCT: 38 % (ref 36.0–46.0)
Hemoglobin: 11.9 g/dL — ABNORMAL LOW (ref 12.0–15.0)
MCH: 31.4 pg (ref 26.0–34.0)
MCHC: 31.3 g/dL (ref 30.0–36.0)
MCV: 100.3 fL — ABNORMAL HIGH (ref 80.0–100.0)
Platelets: 262 10*3/uL (ref 150–400)
RBC: 3.79 MIL/uL — ABNORMAL LOW (ref 3.87–5.11)
RDW: 17.8 % — ABNORMAL HIGH (ref 11.5–15.5)
WBC: 13.2 10*3/uL — ABNORMAL HIGH (ref 4.0–10.5)
nRBC: 0 % (ref 0.0–0.2)

## 2020-12-04 LAB — BASIC METABOLIC PANEL
Anion gap: 6 (ref 5–15)
BUN: 43 mg/dL — ABNORMAL HIGH (ref 8–23)
CO2: 27 mmol/L (ref 22–32)
Calcium: 8.4 mg/dL — ABNORMAL LOW (ref 8.9–10.3)
Chloride: 104 mmol/L (ref 98–111)
Creatinine, Ser: 0.67 mg/dL (ref 0.44–1.00)
GFR, Estimated: >60 mL/min (ref >60)
Glucose, Bld: 118 mg/dL — ABNORMAL HIGH (ref 70–99)
Potassium: 4.5 mmol/L (ref 3.5–5.1)
Sodium: 137 mmol/L (ref 135–145)

## 2020-12-04 NOTE — Plan of Care (Signed)
  Problem: Education: Goal: Knowledge of disease or condition will improve Outcome: Progressing Goal: Knowledge of secondary prevention will improve Outcome: Progressing Goal: Knowledge of patient specific risk factors addressed and post discharge goals established will improve Outcome: Progressing Goal: Individualized Educational Video(s) Outcome: Progressing   Problem: Coping: Goal: Will verbalize positive feelings about self Outcome: Progressing Goal: Will identify appropriate support needs Outcome: Progressing   Problem: Health Behavior/Discharge Planning: Goal: Ability to manage health-related needs will improve Outcome: Progressing   Problem: Self-Care: Goal: Ability to participate in self-care as condition permits will improve Outcome: Progressing Goal: Verbalization of feelings and concerns over difficulty with self-care will improve Outcome: Progressing Goal: Ability to communicate needs accurately will improve Outcome: Progressing   

## 2020-12-04 NOTE — Plan of Care (Signed)
  Problem: Self-Care: Goal: Ability to communicate needs accurately will improve Outcome: Progressing   Problem: Nutrition: Goal: Risk of aspiration will decrease Outcome: Progressing   

## 2020-12-04 NOTE — TOC Progression Note (Signed)
Transition of Care [facility]) - Progression Note    Patient Details  Name: Beverly Li MRN: [id number] Date of Birth: Dec 14, 1945  Transition of Care [facility]) CM/SW Contact  Baldemar Lenis, [place] Phone Number: 12/04/2020, 4:36 PM  Clinical Narrative:   CSW following for SNF placement. CSW contacted [facility] again per family request. [facility] is still unable to offer a bed for the patient, but [facility] asked for additional clinical information to review. CSW faxed clinical information over to [facility] in Admissions, confirmed that she received. Nursing is reviewing information and will contact CSW back after review. CSW to follow.       Barriers to Discharge: Continued Medical Work up  Expected Discharge Plan and Services   In-house Referral: Clinical Social Work   Post Acute Care Choice: Skilled Nursing Facility Living arrangements for the past 2 months: Single Family Home                                       Social Determinants of Health (SDOH) Interventions    Readmission Risk Interventions No flowsheet data found.

## 2020-12-04 NOTE — Progress Notes (Signed)
Patient has not been wearing CPAP. Patient is nonverbal. Pt tolerating 4L [place] well at this time. No respiratory distress noted. RT did not place pt on CPAP at this time. RT will continue to monitor.

## 2020-12-04 NOTE — Progress Notes (Signed)
[facility] PROGRESS NOTE    Beverly Li  OVZ:[id number] DOB: 1945-08-30 DOA: 11/13/2020 PCP: Clayborn Heron, MD      Brief Narrative:  Beverly Li is a 75 y.o. F with A. fib, hokum, and HTN who presented with right-sided weakness, facial droop, left gaze preference.  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  PEG placed 8/19        Assessment & Plan:  Acute stroke -Continue Eliquis, Zetia    Acute hypoxic respiratory failure SPO2 has been hypoxic since admission.  Unclear cause.  This may just be deconditioning, atelectasis, and low-level aspiration.  May be something more substantial. - Obtain CT chest  -Continue Pulmicort, revefenacin  Paroxysmal atrial fibrillation Heart rate controlled - Continue Eliquis - Continue amiodarone, metoprolol  Right femoral-popliteal bypass graft stenosis -Outpatient vascular surgery follow-up  Hypertension Blood pressure controlled - Continue metoprolol and hydralazine  Abdominal hematoma Prior to admission, improving              Disposition: Status is: Inpatient  Remains inpatient appropriate because:IV treatments appropriate due to intensity of illness or inability to take PO  Dispo: The patient is from: Home              Anticipated d/c is to: SNF              Patient currently is not medically stable to d/c.   Difficult to place patient No       Level of care: Med-Surg       MDM: The below labs and imaging reports were reviewed and summarized above.  Medication management as above.    DVT prophylaxis: SCDs Start: 11/13/20 1608 Place and maintain sequential compression device Start: 11/13/20 1431 apixaban (ELIQUIS) tablet 5 mg  Code Status: FULL Family Communication: husband           Subjective: No fever.  No confusion.  No vomiting.  Still aphasic.  Objective: Vitals:    12/04/20 0819 12/04/20 1208 12/04/20 1512 12/04/20 2006  BP:  123/85 (!) 142/99 (!) 158/95  Pulse: 81 78 95 98  Resp: 15 19 18 17   Temp:  (!) 97.4 F (36.3 C) 97.8 F (36.6 C) 97.8 F (36.6 C)  TempSrc:  Axillary Axillary   SpO2: 95% 96% 96% 97%  Weight:      Height:        Intake/Output Summary (Last 24 hours) at 12/04/2020 2037 Last data filed at 12/04/2020 1800 Gross per 24 hour  Intake 2544 ml  Output 1300 ml  Net 1244 ml   Filed Weights   12/02/20 0456 12/03/20 0503 12/04/20 0423  Weight: 53.4 kg 55 kg 55.8 kg    Examination: General appearance:  adult female, alert and in no obvious distress.  Sitting up in recliner.  Densely aphasic. HEENT: Anicteric, conjunctiva pink, lids and lashes normal. No nasal deformity, discharge, epistaxis.   Skin: Warm and dry.  no jaundice.  No suspicious rashes or lesions. Cardiac: RRR, nl S1-S2, no murmurs appreciated.  Capillary refill is brisk.  JVP normal  No LE edema.  Radial  pulses 2+ and symmetric. Respiratory: Normal respiratory rate and rhythm.  CTAB without rales or wheezes. Abdomen: Abdomen soft.  no TTP. No ascites, distension, hepatosplenomegaly.   MSK: No deformities or effusions. Neuro: Awake and alert.  Densely aphasic.  Severely paralyzed on the right side.  Psych: Unable to  assess   Data Reviewed: I have personally reviewed following labs and imaging studies:  CBC: Recent Labs  Lab 11/30/20 0226 12/01/20 0516 12/02/20 0216 12/03/20 0215 12/04/20 0052  WBC 14.2* 13.2* 19.0* 18.3* 13.2*  HGB 11.9* 12.0 12.2 12.2 11.9*  HCT 39.6 39.3 38.9 38.2 38.0  MCV 101.8* 102.3* 99.5 99.2 100.3*  PLT 338 301 271 267 262   Basic Metabolic Panel: Recent Labs  Lab 11/29/20 0128 11/30/20 0826 12/01/20 0516 12/02/20 0216 12/04/20 0052  NA 151* 149* 149* 140 137  K 4.4 3.8 3.8 4.2 4.5  CL 113* 113* 109 108 104  CO2 30 32 33* 25 27  GLUCOSE 140* 110* 93 181* 118*  BUN 60* 57* 49* 51* 43*  CREATININE 0.75 0.80 0.77  0.79 0.67  CALCIUM 8.9 8.5* 8.6* 8.4* 8.4*  MG 2.3  --   --  2.2  --   PHOS 3.4  --   --  3.0  --    GFR: Estimated Creatinine Clearance: 50.3 mL/min (by C-G formula based on SCr of 0.67 mg/dL). Liver Function Tests: Recent Labs  Lab 12/02/20 0216  ALBUMIN 2.4*   No results for input(s): LIPASE, AMYLASE in the last 168 hours. No results for input(s): AMMONIA in the last 168 hours. Coagulation Profile: No results for input(s): INR, PROTIME in the last 168 hours. Cardiac Enzymes: No results for input(s): CKTOTAL, CKMB, CKMBINDEX, TROPONINI in the last 168 hours. BNP (last 3 results) No results for input(s): PROBNP in the last 8760 hours. HbA1C: No results for input(s): HGBA1C in the last 72 hours. CBG: Recent Labs  Lab 12/04/20 0350 12/04/20 0803 12/04/20 1211 12/04/20 1616 12/04/20 2004  GLUCAP 135* 140* 139* 130* 136*   Lipid Profile: No results for input(s): CHOL, HDL, LDLCALC, TRIG, CHOLHDL, LDLDIRECT in the last 72 hours. Thyroid Function Tests: No results for input(s): TSH, T4TOTAL, FREET4, T3FREE, THYROIDAB in the last 72 hours. Anemia Panel: No results for input(s): VITAMINB12, FOLATE, FERRITIN, TIBC, IRON, RETICCTPCT in the last 72 hours. Urine analysis:    Component Value Date/Time   COLORURINE YELLOW 11/13/2020 1728   APPEARANCEUR CLEAR 11/13/2020 1728   LABSPEC 1.045 (H) 11/13/2020 1728   PHURINE 7.0 11/13/2020 1728   GLUCOSEU NEGATIVE 11/13/2020 1728   HGBUR SMALL (A) 11/13/2020 1728   BILIRUBINUR NEGATIVE 11/13/2020 1728   KETONESUR 5 (A) 11/13/2020 1728   PROTEINUR NEGATIVE 11/13/2020 1728   UROBILINOGEN 1.0 02/27/2015 1127   NITRITE NEGATIVE 11/13/2020 1728   LEUKOCYTESUR NEGATIVE 11/13/2020 1728   Sepsis Labs: @LABRCNTIP (procalcitonin:4,lacticacidven:4)  )No results found for this or any previous visit (from the past 240 hour(s)).       Radiology Studies: No results found.      Scheduled Meds:  [START ON 12/06/2020] amiodarone  200  mg Per Tube Daily   amiodarone  400 mg Per Tube BID   apixaban  5 mg Per Tube BID   arformoterol  15 mcg Nebulization BID   budesonide (PULMICORT) nebulizer solution  0.5 mg Nebulization BID   buPROPion  75 mg Per Tube BID   chlorhexidine  15 mL Mouth Rinse BID   ezetimibe  10 mg Per Tube Daily   feeding supplement (PROSource TF)  45 mL Per Tube BID   free water  200 mL Per Tube Q4H   guaiFENesin  15 mL Per Tube Q6H   hydrALAZINE  50 mg Per Tube BID   insulin aspart  0-9 Units Subcutaneous Q4H   mouth rinse  15 mL  Mouth Rinse q12n4p   metoprolol tartrate  12.5 mg Per Tube BID   pantoprazole sodium  40 mg Per Tube QHS   revefenacin  175 mcg Nebulization Daily   Continuous Infusions:  ceFEPime (MAXIPIME) IV 2 g (12/04/20 1509)   feeding supplement (JEVITY 1.2 CAL) 55 mL/hr at 12/04/20 1800     LOS: 21 days    Time spent: 35 minutes    Alberteen Sam, MD [facility] 12/04/2020, 8:37 PM     Please page though AMION or Epic secure chat:  For [employer], Higher education careers adviser

## 2020-12-04 NOTE — Plan of Care (Signed)
°  Problem: Education: °Goal: Knowledge of disease or condition will improve °Outcome: Progressing °Goal: Knowledge of secondary prevention will improve °Outcome: Progressing °Goal: Knowledge of patient specific risk factors addressed and post discharge goals established will improve °Outcome: Progressing °Goal: Individualized Educational Video(s) °Outcome: Progressing °  °Problem: Coping: °Goal: Will verbalize positive feelings about self °Outcome: Progressing °Goal: Will identify appropriate support needs °Outcome: Progressing °  °Problem: Health Behavior/Discharge Planning: °Goal: Ability to manage health-related needs will improve °Outcome: Progressing °  °

## 2020-12-04 NOTE — Progress Notes (Signed)
Physical Therapy Treatment Patient Details Name: Beverly Li MRN: [id number] DOB: 09/10/1945 Today's Date: 12/04/2020    History of Present Illness 75 y.o. female who presented 11/13/20 with R sided weakness and L gaze preference. Outside window for tPA. 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 S/p thrombectomy with TICI 3 revascularization 8/1. ETT 8/1-8/2. 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 PMH: AFib on Eliquis (off x 1 week), COPD, remote tobacco abuse, hypertrophic cardiomyopathy, aortic stenosis, PAD, anxiety, arthritis, Bell's palsy, HTN, osteopenia, and GERD    PT Comments    Today's skilled session continued to focus on mobility progression. Pt continues to require up to max assist for mobility with decreased ability to communicate. Beginning to follow more commands. Acute PT to continued during pt's hospital stay.    Follow Up Recommendations  Supervision/Assistance - 24 hour;SNF     Equipment Recommendations  3in1 (PT);Hospital bed    Recommendations for Other Services Rehab consult     Precautions / Restrictions Precautions Precautions: Fall Precaution Comments: R hemiplegia, PEG Restrictions Weight Bearing Restrictions: No    Mobility  Bed Mobility Overal bed mobility: Needs Assistance Bed Mobility: Supine to Sit     Supine to sit: Max assist;HOB elevated     General bed mobility comments: pt assist with moving left LE towarfd EOB and attempted to use left UE to assist with trunk elevation. pads under pt used to move hips closer to edge of bed for feet to touch floor.    Transfers Overall transfer level: Needs assistance Equipment used: None Transfers: Sit to/from [employer] Sit to Stand: Max assist Stand pivot transfers: Max assist        General transfer comment: Max A to power up and pivot to recliner. Noting pt accepting weight through bil LE"s, holding onto PTA with left UE and attempted to step left foot toward chair with right knee buckling (PTA then blocking right knee).  Ambulation/Gait             General Gait Details: Unable this date.     Modified Rankin (Stroke Patients Only) Modified Rankin (Stroke Patients Only) Pre-Morbid Rankin Score: No symptoms Modified Rankin: Severe disability     Balance Overall balance assessment: Needs assistance Sitting-balance support: Single extremity supported;Feet supported Sitting balance-Leahy Scale: Poor Sitting balance - Comments: pt needing min to min guard assist at EOB. had pt move left UE forward to hold chair or be support on PTA knee (seated in front of her) to promote anterior weight shifting due to posterior lean. Once pt was able to hold this with min guard assist began to work on pt reaching to PTA hand at target with left UE with min assist needed for balance. Increased time needed for pt to locate PTA hand and then reach for it. Once movement initated assisted pt as needed. Postural control: Right lateral lean;Posterior lean Standing balance support: Bilateral upper extremity supported;During functional activity Standing balance-Leahy Scale: Zero Standing balance comment: Max A, dependent on therapist for standing balance                            Cognition Arousal/Alertness: Awake/alert Behavior During Therapy: Flat affect Overall Cognitive Status: Difficult to assess Area of Impairment: Following commands;Safety/judgement;Awareness;Problem solving  Current Attention Level: Focused;Sustained   Following Commands: Follows one step commands inconsistently Safety/Judgement: Decreased awareness of safety   Problem Solving: Slow processing;Decreased initiation;Requires verbal cues;Requires tactile cues General  Comments: Pt with eye contact and tracking PTA around room. Following simlepe commands ~50% with increased time- (i.e. lifting foot to don sock, place left hand here with mobility, reaching toward targets while seated at edge of bed)       Pertinent Vitals/Pain Faces Pain Scale: No hurt     PT Goals (current goals can now be found in the care plan section) Acute Rehab PT Goals Patient Stated Goal: spouse would like pt to go to [facility] PT Goal Formulation: With family Time For Goal Achievement: 12/13/20 Potential to Achieve Goals: Fair Progress towards PT goals: Progressing toward goals    Frequency    Min 3X/week      PT Plan Current plan remains appropriate    AM-PAC PT "6 Clicks" Mobility   Outcome Measure  Help needed turning from your back to your side while in a flat bed without using bedrails?: Total Help needed moving from lying on your back to sitting on the side of a flat bed without using bedrails?: Total Help needed moving to and from a bed to a chair (including a wheelchair)?: Total Help needed standing up from a chair using your arms (e.g., wheelchair or bedside chair)?: Total Help needed to walk in hospital room?: Total Help needed climbing 3-5 steps with a railing? : Total 6 Click Score: 6    End of Session Equipment Utilized During Treatment: Oxygen;Gait belt Activity Tolerance: Patient limited by fatigue;Patient tolerated treatment well Patient left: in chair;with call bell/phone within reach;with chair alarm set;with family/visitor present Nurse Communication: Mobility status PT Visit Diagnosis: Muscle weakness (generalized) (M62.81);Difficulty in walking, not elsewhere classified (R26.2);Other symptoms and signs involving the nervous system (R29.898);Hemiplegia and hemiparesis Hemiplegia - Right/Left: Right Hemiplegia - dominant/non-dominant: Dominant Hemiplegia - caused by: Cerebral infarction     Time: 0950-1016 PT Time Calculation (min)  (ACUTE ONLY): 26 min  Charges:  $Therapeutic Activity: 8-22 mins $Neuromuscular Re-education: 8-22 mins                    Sallyanne Kuster, PTA, [facility] Acute Rehab Services Office- [phone] 12/04/20, 12:04 PM   Sallyanne Kuster 12/04/2020, 12:04 PM

## 2020-12-05 ENCOUNTER — Inpatient Hospital Stay (HOSPITAL_COMMUNITY): Payer: Medicare Other

## 2020-12-05 DIAGNOSIS — I6602 Occlusion and stenosis of left middle cerebral artery: Secondary | ICD-10-CM | POA: Diagnosis not present

## 2020-12-05 LAB — GLUCOSE, CAPILLARY
Glucose-Capillary: 132 mg/dL — ABNORMAL HIGH (ref 70–99)
Glucose-Capillary: 133 mg/dL — ABNORMAL HIGH (ref 70–99)
Glucose-Capillary: 138 mg/dL — ABNORMAL HIGH (ref 70–99)
Glucose-Capillary: 150 mg/dL — ABNORMAL HIGH (ref 70–99)
Glucose-Capillary: 158 mg/dL — ABNORMAL HIGH (ref 70–99)
Glucose-Capillary: 89 mg/dL (ref 70–99)

## 2020-12-05 LAB — BASIC METABOLIC PANEL
Anion gap: 5 (ref 5–15)
BUN: 36 mg/dL — ABNORMAL HIGH (ref 8–23)
CO2: 29 mmol/L (ref 22–32)
Calcium: 8.6 mg/dL — ABNORMAL LOW (ref 8.9–10.3)
Chloride: 102 mmol/L (ref 98–111)
Creatinine, Ser: 0.63 mg/dL (ref 0.44–1.00)
GFR, Estimated: >60 mL/min (ref >60)
Glucose, Bld: 121 mg/dL — ABNORMAL HIGH (ref 70–99)
Potassium: 4.5 mmol/L (ref 3.5–5.1)
Sodium: 136 mmol/L (ref 135–145)

## 2020-12-05 LAB — CBC
HCT: 38.8 % (ref 36.0–46.0)
Hemoglobin: 12.3 g/dL (ref 12.0–15.0)
MCH: 31.3 pg (ref 26.0–34.0)
MCHC: 31.7 g/dL (ref 30.0–36.0)
MCV: 98.7 fL (ref 80.0–100.0)
Platelets: 253 10*3/uL (ref 150–400)
RBC: 3.93 MIL/uL (ref 3.87–5.11)
RDW: 17.6 % — ABNORMAL HIGH (ref 11.5–15.5)
WBC: 11.5 10*3/uL — ABNORMAL HIGH (ref 4.0–10.5)
nRBC: 0 % (ref 0.0–0.2)

## 2020-12-05 LAB — SARS CORONAVIRUS 2 (TAT 6-24 HRS): SARS Coronavirus 2: NEGATIVE

## 2020-12-05 MED ORDER — ALBUTEROL SULFATE (2.5 MG/3ML) 0.083% IN NEBU: 2.5000 mg | INHALATION_SOLUTION | RESPIRATORY_TRACT | Status: DC | PRN

## 2020-12-05 MED ORDER — ALBUTEROL SULFATE (2.5 MG/3ML) 0.083% IN NEBU
2.5000 mg | INHALATION_SOLUTION | Freq: Three times a day (TID) | RESPIRATORY_TRACT | Status: DC
  Administered 2020-12-05: 2.5 mg via RESPIRATORY_TRACT
  Filled 2020-12-05: qty 3

## 2020-12-05 MED ORDER — IOHEXOL 350 MG/ML SOLN
50.0000 mL | Freq: Once | INTRAVENOUS | Status: AC | PRN
  Administered 2020-12-05: 50 mL via INTRAVENOUS

## 2020-12-05 NOTE — Progress Notes (Signed)
Patient has not been wearing CPAP. Patient is nonverbal. Pt tolerating 2L [place] well at this time. No respiratory distress noted. RT did not place pt on CPAP at this time. RT will continue to monitor.

## 2020-12-05 NOTE — Progress Notes (Signed)
  Speech Language Pathology Treatment: Dysphagia;Cognitive-Linquistic  Patient Details Name: Beverly Li MRN: [id number] DOB: 11/22/1945 Today's Date: 12/05/2020 Time: 0920-1000 SLP Time Calculation (min) (ACUTE ONLY): 40 min  Assessment / Plan / Recommendation Clinical Impression  Beverly Li was alert; husband at bedside.  Therapy focused on communication and dysphagia.  Followed simple commands with max visual/tactile/verbal cues and modeling - she opened and closed toothpaste, squeezed tube, brushed teeth with physical assist to use LUE.  Self-suctioned and washed mouth with wet towel with continued max assist.  Noted to vocalize with greater spontaneity - no real words identified.  Swallowing is characterized by improved effort to masticate ice, palpable and consistent swallowing, coughing noted only after initial bolus.  Pt is ready to participate in an MBS to establish pathophysiology of swallow and determine a baseline for treatment.  She should also begin having ice chips intermittently throughout the day to facilitate swallowing.   D/W Beverly Li and Beverly Li, who are in agreement.    HPI HPI: 75 year old female with proximal A. 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 Intubated 8/1, extubated 8/2. MRI shows Acute infarcts involving the left basal ganglia, left insula, overlying  left frontal lobe and high left parasagittal frontal lobe cortex,  compatible with left ACA and MCA territory infarcts. Small areas of  acute infarct in the left frontal lobe white matter and punctate  left parietal cortical infarct, also left ACA and MCA territories.  Associated edema without mass effect. Additional punctate acute  infarcts in right frontal and parietal cortex. Small acute right  cerebellar infarct. Partial lung collapse 8/7 requiring temporary CPAP.      SLP Plan  MBS;Continue with current plan of care       Recommendations  Diet recommendations: NPO Medication  Administration: Via alternative means                Oral Care Recommendations: Oral care prior to ice chip/H20 Follow up Recommendations: Skilled Nursing facility SLP Visit Diagnosis: Dysphagia, oropharyngeal phase (R13.12);Aphasia (R47.01) Plan: MBS;Continue with current plan of care                    Beverly Li L. Beverly Frederic, MA CCC/SLP Acute Rehabilitation Services Office number [phone] Pager [phone]   Beverly Li Beverly Li 12/05/2020, 10:05 AM

## 2020-12-05 NOTE — Progress Notes (Signed)
[place] [facility] PROGRESS NOTE    Beverly Li  QJJ:[id number] DOB: [birth year] DOA: 11/13/2020 PCP: Clayborn Heron, MD      Brief Narrative:  Beverly Li is a 75 y.o. F with A. fib, hokum, and HTN who presented with right-sided weakness, facial droop, left gaze preference.  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   8/1: Admitted for stroke, successful thrombectomy 8/2: extubated 8/2: Afib on tele, amiodarone load, cardiology consulted 8/7: Requriing up to [facility] for oxygenation, due to fluid overload, atelectasis 8/13: cold right foot, Vascular surgery consulted, US showed good flow, CTA reviewed by Vascular who recommended outpatient follow up 8/19: PEG placed        Assessment & Plan:  Acute stroke Patient was intubated and admitted to ICU.  CTA head and neck showed emergent left M1 MCA occlusion and she was taken to the cath lab for thrombectomy.     -Echocardiogram showed no cardiogenic source of embolism -Carotid imaging with <70% stenosis  -Lipids ordered: statin intolerant, continue Zetia -Aspirin ordered at admission --> discharged on Eiqius -Atrial fibrillation: previously known -Underwent thrombectomy -Dysphagia screen failed due to requiring intubation -PT eval ordered: recommended SNF -Smoking cessation: recommended     Acute hypoxic respiratory failure Not on O2 at baseline.  Has been hypoxic since admission.  This appears to be from extensive emphysema (severe by CT, on ICS/LABA at home, long term smoker, but husband unaware of any lung diagnoses).    Here worse due to CHF flare, but mostly deconditioning, atelectasis.  CT chest showed severe emphysema but no other airspace disease, no effusions.    -Continue Pulmicort, revefenacin    Acute on chronic diastolic CHF Had bilateral infiltrates, swelling, effusions earlier in hospital stay, in setting  of Afib, improved with diuresis.   Now euvolemic.  Paroxysmal atrial fibrillation Heart rate controlled - Continue Eliquis - Continue amiodarone, metoprolol  Right femoral-popliteal bypass graft stenosis -Outpatient vascular surgery follow-up in 2-3 months  Hypertension Blood pressure controlled - Continue metoprolol and hydralazine  Abdominal hematoma Prior to admission, improving              Disposition: Status is: Inpatient  Remains inpatient appropriate because:IV treatments appropriate due to intensity of illness or inability to take PO  Dispo: The patient is from: Home              Anticipated d/c is to: SNF              Patient currently is not medically stable to d/c.   Difficult to place patient No       Level of care: Med-Surg       MDM: The below labs and imaging reports were reviewed and summarized above.  Medication management as above.    DVT prophylaxis: Place and maintain sequential compression device Start: 11/13/20 1431 apixaban (ELIQUIS) tablet 5 mg  Code Status: FULL Family Communication: husband           Subjective: No fever.  No confusion.  No vomiting.  Still aphasic.  Objective: Vitals:   12/05/20 1722 12/05/20 1956 12/05/20 1957 12/05/20 2018  BP: (!) 161/87   (!) 163/97  Pulse: 74   100  Resp: (!) 21   19  Temp: (!) 97.5 F (36.4 C)   (!) 97.5 F (36.4 C)  TempSrc: Axillary   Oral  SpO2: 95% (!) 88% 95% 98%  Weight:      Height:        Intake/Output Summary (Last 24 hours) at 12/05/2020 2122 Last data filed at 12/05/2020 0500 Gross per 24 hour  Intake 850.08 ml  Output 800 ml  Net 50.08 ml   Filed Weights   12/03/20 0503 12/04/20 0423 12/05/20 0500  Weight: 55 kg 55.8 kg 55.8 kg    Examination: General appearance:  adult female, alert and in no obvious distress.  Sitting up in recliner.  Densely aphasic. HEENT: Anicteric, conjunctiva pink, lids and lashes normal. No nasal deformity, discharge,  epistaxis.   Skin: Warm and dry.  no jaundice.  No suspicious rashes or lesions. Cardiac: RRR, nl S1-S2, no murmurs appreciated.  Capillary refill is brisk.  JVP normal  No LE edema.  Radial  pulses 2+ and symmetric. Respiratory: Normal respiratory rate and rhythm.  CTAB without rales or wheezes. Abdomen: Abdomen soft.  no TTP. No ascites, distension, hepatosplenomegaly.   MSK: No deformities or effusions. Neuro: Awake and alert.  Densely aphasic.  Severely paralyzed on the right side.  Psych: Unable to assess   Data Reviewed: I have personally reviewed following labs and imaging studies:  CBC: Recent Labs  Lab 12/01/20 0516 12/02/20 0216 12/03/20 0215 12/04/20 0052 12/05/20 0310  WBC 13.2* 19.0* 18.3* 13.2* 11.5*  HGB 12.0 12.2 12.2 11.9* 12.3  HCT 39.3 38.9 38.2 38.0 38.8  MCV 102.3* 99.5 99.2 100.3* 98.7  PLT 301 271 267 262 253   Basic Metabolic Panel: Recent Labs  Lab 11/29/20 0128 11/30/20 0826 12/01/20 0516 12/02/20 0216 12/04/20 0052 12/05/20 0310  NA 151* 149* 149* 140 137 136  K 4.4 3.8 3.8 4.2 4.5 4.5  CL 113* 113* 109 108 104 102  CO2 30 32 33* 25 27 29   GLUCOSE 140* 110* 93 181* 118* 121*  BUN 60* 57* 49* 51* 43* 36*  CREATININE 0.75 0.80 0.77 0.79 0.67 0.63  CALCIUM 8.9 8.5* 8.6* 8.4* 8.4* 8.6*  MG 2.3  --   --  2.2  --   --   PHOS 3.4  --   --  3.0  --   --    GFR: Estimated Creatinine Clearance: 50.3 mL/min (by C-G formula based on SCr of 0.63 mg/dL). Liver Function Tests: Recent Labs  Lab 12/02/20 0216  ALBUMIN 2.4*   No results for input(s): LIPASE, AMYLASE in the last 168 hours. No results for input(s): AMMONIA in the last 168 hours. Coagulation Profile: No results for input(s): INR, PROTIME in the last 168 hours. Cardiac Enzymes: No results for input(s): CKTOTAL, CKMB, CKMBINDEX, TROPONINI in the last 168 hours. BNP (last 3 results) No results for input(s): PROBNP in the last 8760 hours. HbA1C: No results for input(s): HGBA1C in the  last 72 hours. CBG: Recent Labs  Lab 12/05/20 0405 12/05/20 0744 12/05/20 1238 12/05/20 1721 12/05/20 2017  GLUCAP 158* 138* 132* 150* 133*   Lipid Profile: No results for input(s): CHOL, HDL, LDLCALC, TRIG, CHOLHDL, LDLDIRECT in the last 72 hours. Thyroid Function Tests: No results for input(s): TSH, T4TOTAL, FREET4, T3FREE, THYROIDAB in the last 72 hours. Anemia Panel: No results for input(s): VITAMINB12, FOLATE, FERRITIN, TIBC, IRON, RETICCTPCT in the last 72 hours. Urine analysis:    Component Value Date/Time   COLORURINE YELLOW 11/13/2020 1728   APPEARANCEUR CLEAR 11/13/2020 1728   LABSPEC 1.045 (H) 11/13/2020 1728   PHURINE 7.0 11/13/2020 1728   GLUCOSEU NEGATIVE 11/13/2020 1728   HGBUR SMALL (A) 11/13/2020 1728  BILIRUBINUR NEGATIVE 11/13/2020 1728   KETONESUR 5 (A) 11/13/2020 1728   PROTEINUR NEGATIVE 11/13/2020 1728   UROBILINOGEN 1.0 02/27/2015 1127   NITRITE NEGATIVE 11/13/2020 1728   LEUKOCYTESUR NEGATIVE 11/13/2020 1728   Sepsis Labs: @LABRCNTIP (procalcitonin:4,lacticacidven:4)  )No results found for this or any previous visit (from the past 240 hour(s)).       Radiology Studies: CT CHEST W CONTRAST  Result Date: 12/05/2020 CLINICAL DATA:  Acute hypoxic respiratory failure, abnormal chest x-ray. EXAM: CT CHEST WITH CONTRAST TECHNIQUE: Multidetector CT imaging of the chest was performed during intravenous contrast administration. CONTRAST:  50mL OMNIPAQUE IOHEXOL 350 MG/ML SOLN COMPARISON:  None. FINDINGS: Cardiovascular: There is extensive multi-vessel coronary artery calcification. There is mild to moderate cardiomegaly with left ventricular hypertrophy. Hypertrophy of the left ventricle asymmetrically involving the ventricular septum results in marked narrowing of the left ventricular outflow tract. This is best appreciated on axial image # 96/3 and sagittal image # 100/7. No pericardial effusion. There is marked enlargement of the a central pulmonary  arteries in keeping with changes of pulmonary arterial hypertension. There is adequate opacification of the pulmonary arterial tree and no intraluminal filling defect is identified to suggest acute pulmonary embolism. Moderate atherosclerotic calcification noted within the thoracic aorta. The thoracic aorta is dilated measuring 4.3 cm in diameter in its ascending segment, 3.5 cm in diameter at the arch, and 2.5 cm in diameter at the level of the left atrium. Mediastinum/Nodes: The visualized thyroid is unremarkable. No pathologic thoracic adenopathy. The esophagus is unremarkable. Lungs/Pleura: Severe emphysema. Small bilateral pleural effusions have developed with bibasilar compressive atelectasis. No superimposed focal pulmonary infiltrate. No pneumothorax. Central airways are widely patent. Upper Abdomen: No acute abnormality. Musculoskeletal: No acute bone abnormality. No lytic or blastic bone lesion identified. IMPRESSION: Severe emphysema. Small bilateral pleural effusions with bibasilar compressive atelectasis. Extensive multi-vessel coronary artery calcification. Cardiomegaly with asymmetric septal hypertrophy resulting in impingement of the left ventricular outflow tract. Correlation with echocardiography may be helpful for further evaluation. Morphologic changes in keeping with pulmonary arterial hypertension. Dilation of the a thoracic aorta measuring 4.3 cm in greatest dimension within the ascending segment. Recommend annual imaging followup by CTA or MRA. This recommendation follows 2010 ACCF/AHA/AATS/ACR/ASA/SCA/SCAI/SIR/STS/SVM Guidelines for the Diagnosis and Management of Patients with Thoracic Aortic Disease. Circulation. 2010; 121: W295-A213: E266-e369. Aortic aneurysm NOS (ICD10-I71.9) Aortic Atherosclerosis (ICD10-I70.0) and Emphysema (ICD10-J43.9). Aortic aneurysm NOS (ICD10-I71.9). Electronically Signed   By: Helyn NumbersAshesh  Parikh M.D.   On: 12/05/2020 00:49        Scheduled Meds:  [START ON 12/06/2020]  amiodarone  200 mg Per Tube Daily   apixaban  5 mg Per Tube BID   arformoterol  15 mcg Nebulization BID   budesonide (PULMICORT) nebulizer solution  0.5 mg Nebulization BID   buPROPion  75 mg Per Tube BID   chlorhexidine  15 mL Mouth Rinse BID   ezetimibe  10 mg Per Tube Daily   feeding supplement (PROSource TF)  45 mL Per Tube BID   free water  200 mL Per Tube Q4H   guaiFENesin  15 mL Per Tube Q6H   hydrALAZINE  50 mg Per Tube BID   insulin aspart  0-9 Units Subcutaneous Q4H   mouth rinse  15 mL Mouth Rinse q12n4p   metoprolol tartrate  12.5 mg Per Tube BID   pantoprazole sodium  40 mg Per Tube QHS   revefenacin  175 mcg Nebulization Daily   Continuous Infusions:  feeding supplement (JEVITY 1.2 CAL) 1,000 mL (12/05/20 2058)  LOS: 22 days    Time spent: 25 minutes    Alberteen Sam, MD [facility] 12/05/2020, 9:22 PM     Please page though AMION or Epic secure chat:  For [employer], Higher education careers adviser

## 2020-12-05 NOTE — Progress Notes (Signed)
Occupational Therapy Treatment Patient Details Name: Beverly Li MRN: [id number] DOB: Oct 10, 1945 Today's Date: 12/05/2020    History of present illness 75 y.o. female who presented 11/13/20 with R sided weakness and L gaze preference. Outside window for tPA. 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 S/p thrombectomy with TICI 3 revascularization 8/1. ETT 8/1-8/2. 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 PMH: AFib on Eliquis (off x 1 week), COPD, remote tobacco abuse, hypertrophic cardiomyopathy, aortic stenosis, PAD, anxiety, arthritis, Bell's palsy, HTN, osteopenia, and GERD   OT comments  Pt progressing slow towards established OT goals. Pt with both bowel and bladder incontinence and requiring Max A for bed mobility and toileting. Pt tolerating sitting at EOB while donning new gown and weight bearing through R elbow with Min Guard A. Pt performing stand pivot to recliner with Max A. Noting pt with increased attention, following commands, and engagement this session. Continue to recommend SNF for post-acute rehab and will continue to follow acutely as admitted.    Follow Up Recommendations  SNF    Equipment Recommendations  3 in 1 bedside commode;Wheelchair (measurements OT);Wheelchair cushion (measurements OT);Hospital bed    Recommendations for Other Services Rehab consult    Precautions / Restrictions Precautions Precautions: Fall Precaution Comments: R hemiplegia, PEG, adominal binder       Mobility Bed Mobility Overal bed mobility: Needs Assistance Bed Mobility: Rolling;Sidelying to Sit Rolling: Max assist Sidelying to sit: Max assist       General bed mobility comments: Max A for rolling in bed and then to bring BLEs over EOB and elevate trunk into sitting    Transfers Overall  transfer level: Needs assistance   Transfers: Sit to/from Stand;Stand Pivot Transfers Sit to Stand: Max assist Stand pivot transfers: Max assist       General transfer comment: Max A to power up and pivot to recliner.    Balance Overall balance assessment: Needs assistance Sitting-balance support: Single extremity supported;Feet supported Sitting balance-Leahy Scale: Poor Sitting balance - Comments: Pt able to reach Min Guard  level with support using LUE on knee. Cues for forward weight shift of trunk.   Standing balance support: Bilateral upper extremity supported;During functional activity Standing balance-Leahy Scale: Zero                             ADL either performed or assessed with clinical judgement   ADL Overall ADL's : Needs assistance/impaired                 Upper Body Dressing : Maximal assistance;Sitting   Lower Body Dressing: Maximal assistance;Sit to/from stand Lower Body Dressing Details (indicate cue type and reason): Pt participating in LB bathign to wash L thigh while supine in bed (HOB elevated) with hand over hand to initiate. Requiring Max A overall for performance Toilet Transfer: Maximal assistance;Stand-pivot Toilet Transfer Details (indicate cue type and reason): Max A for power up and maintaining balance Toileting- Clothing Manipulation and Hygiene: Total assistance;+2 for physical assistance;Bed level Toileting - Clothing Manipulation Details (indicate cue type and reason): Total A for peri care after bowel and bladder incontinence.     Functional mobility during ADLs: Maximal assistance (stand pivot) General ADL Comments: Pt with inconintence in bed upon arrival. Total A for  peri care. Pt participating in LB bathing while supine. Pt participating in donning new gown while sitting at EOB with Max A; pt able to place LUE through sleeve with Max cues  and increased time. stand pivot to recliner with Max A     Vision        Perception     Praxis      Cognition Arousal/Alertness: Awake/alert Behavior During Therapy: Flat affect Overall Cognitive Status: Difficult to assess Area of Impairment: Following commands;Awareness;Problem solving;Safety/judgement                   Current Attention Level: Sustained;Focused (short)   Following Commands: Follows one step commands inconsistently;Follows one step commands with increased time Safety/Judgement: Decreased awareness of safety;Decreased awareness of deficits Awareness: Intellectual Problem Solving: Slow processing;Difficulty sequencing;Decreased initiation;Requires verbal cues;Requires tactile cues General Comments: Pt able to make and maintain eye contact. Follow one step instructions inconsistently with increased time. Noting increased facial appropiate expressions in responce to conversation. participating in ADLs with hand over hand cues for initating        Exercises Exercises: Other exercises Other Exercises Other Exercises: Weight bearing through R elbow while sitting at EOB   Shoulder Instructions       General Comments SpO2 97% on 1L O2. Abdominal binder very soiled; doffed and ordered new one (that is smaller).    Pertinent Vitals/ Pain       Pain Assessment: Faces Faces Pain Scale: Hurts a little bit Pain Location: generalized Pain Descriptors / Indicators: Grimacing Pain Intervention(s): Monitored during session  Home Living                                          Prior Functioning/Environment              Frequency  Min 2X/week        Progress Toward Goals  OT Goals(current goals can now be found in the care plan section)  Progress towards OT goals: Progressing toward goals  Acute Rehab OT Goals Patient Stated Goal: spouse would like pt to go to [facility] OT Goal Formulation: With patient/family Time For Goal Achievement: 12/12/20 Potential to Achieve Goals: Fair ADL Goals Pt Will  Perform Grooming: with mod assist;sitting Additional ADL Goal #1: Pt will sustain attention during simple ADL with Min cues Additional ADL Goal #2: Pt will maintain static sitting at EOB with Min Guard A for 5 minutes in preparation for ADLs  Plan Discharge plan remains appropriate    Co-evaluation                 AM-PAC OT "6 Clicks" Daily Activity     Outcome Measure   Help from another person eating meals?: Total Help from another person taking care of personal grooming?: A Lot Help from another person toileting, which includes using toliet, bedpan, or urinal?: Total Help from another person bathing (including washing, rinsing, drying)?: Total Help from another person to put on and taking off regular upper body clothing?: Total Help from another person to put on and taking off regular lower body clothing?: Total 6 Click Score: 7    End of Session Equipment Utilized During Treatment: Gait belt;Oxygen  OT Visit Diagnosis: Unsteadiness on feet (R26.81);Muscle weakness (generalized) (M62.81);Hemiplegia and hemiparesis Hemiplegia - Right/Left: Right Hemiplegia - dominant/non-dominant: Dominant Hemiplegia - caused by: Cerebral infarction   Activity Tolerance Patient tolerated  treatment well   Patient Left in chair;with call bell/phone within reach;with chair alarm set   Nurse Communication Mobility status;Need for lift equipment        Time: 1140-1231 OT Time Calculation (min): 51 min  Charges: OT General Charges $OT Visit: 1 Visit OT Treatments $Self Care/Home Management : 38-52 mins  Ketrick Matney MSOT, OTR/L Acute Rehab Pager: [phone] Office: [phone]   Theodoro Grist Nickole Adamek 12/05/2020, 1:58 PM

## 2020-12-06 ENCOUNTER — Inpatient Hospital Stay (HOSPITAL_COMMUNITY): Payer: Medicare Other

## 2020-12-06 DIAGNOSIS — R4020 Unspecified coma: Secondary | ICD-10-CM | POA: Diagnosis not present

## 2020-12-06 DIAGNOSIS — I6602 Occlusion and stenosis of left middle cerebral artery: Secondary | ICD-10-CM | POA: Diagnosis not present

## 2020-12-06 DIAGNOSIS — Z743 Need for continuous supervision: Secondary | ICD-10-CM | POA: Diagnosis not present

## 2020-12-06 DIAGNOSIS — K219 Gastro-esophageal reflux disease without esophagitis: Secondary | ICD-10-CM | POA: Diagnosis not present

## 2020-12-06 DIAGNOSIS — I350 Nonrheumatic aortic (valve) stenosis: Secondary | ICD-10-CM | POA: Diagnosis not present

## 2020-12-06 DIAGNOSIS — R1310 Dysphagia, unspecified: Secondary | ICD-10-CM | POA: Diagnosis not present

## 2020-12-06 DIAGNOSIS — I63512 Cerebral infarction due to unspecified occlusion or stenosis of left middle cerebral artery: Secondary | ICD-10-CM | POA: Diagnosis not present

## 2020-12-06 DIAGNOSIS — Z20822 Contact with and (suspected) exposure to covid-19: Secondary | ICD-10-CM | POA: Diagnosis not present

## 2020-12-06 DIAGNOSIS — I10 Essential (primary) hypertension: Secondary | ICD-10-CM | POA: Diagnosis not present

## 2020-12-06 DIAGNOSIS — G510 Bell's palsy: Secondary | ICD-10-CM | POA: Diagnosis not present

## 2020-12-06 DIAGNOSIS — I480 Paroxysmal atrial fibrillation: Secondary | ICD-10-CM | POA: Diagnosis not present

## 2020-12-06 DIAGNOSIS — R092 Respiratory arrest: Secondary | ICD-10-CM | POA: Diagnosis not present

## 2020-12-06 DIAGNOSIS — Z431 Encounter for attention to gastrostomy: Secondary | ICD-10-CM | POA: Diagnosis not present

## 2020-12-06 DIAGNOSIS — R2681 Unsteadiness on feet: Secondary | ICD-10-CM | POA: Diagnosis not present

## 2020-12-06 DIAGNOSIS — R1312 Dysphagia, oropharyngeal phase: Secondary | ICD-10-CM | POA: Diagnosis not present

## 2020-12-06 DIAGNOSIS — J449 Chronic obstructive pulmonary disease, unspecified: Secondary | ICD-10-CM | POA: Diagnosis not present

## 2020-12-06 DIAGNOSIS — R5381 Other malaise: Secondary | ICD-10-CM | POA: Diagnosis not present

## 2020-12-06 DIAGNOSIS — I69351 Hemiplegia and hemiparesis following cerebral infarction affecting right dominant side: Secondary | ICD-10-CM | POA: Diagnosis not present

## 2020-12-06 DIAGNOSIS — I5033 Acute on chronic diastolic (congestive) heart failure: Secondary | ICD-10-CM | POA: Diagnosis not present

## 2020-12-06 DIAGNOSIS — Z95828 Presence of other vascular implants and grafts: Secondary | ICD-10-CM | POA: Diagnosis not present

## 2020-12-06 DIAGNOSIS — M48062 Spinal stenosis, lumbar region with neurogenic claudication: Secondary | ICD-10-CM | POA: Diagnosis not present

## 2020-12-06 DIAGNOSIS — J9601 Acute respiratory failure with hypoxia: Secondary | ICD-10-CM | POA: Diagnosis not present

## 2020-12-06 DIAGNOSIS — G459 Transient cerebral ischemic attack, unspecified: Secondary | ICD-10-CM | POA: Diagnosis not present

## 2020-12-06 DIAGNOSIS — I739 Peripheral vascular disease, unspecified: Secondary | ICD-10-CM | POA: Diagnosis not present

## 2020-12-06 DIAGNOSIS — I69391 Dysphagia following cerebral infarction: Secondary | ICD-10-CM | POA: Diagnosis not present

## 2020-12-06 DIAGNOSIS — Z7401 Bed confinement status: Secondary | ICD-10-CM | POA: Diagnosis not present

## 2020-12-06 DIAGNOSIS — E43 Unspecified severe protein-calorie malnutrition: Secondary | ICD-10-CM | POA: Diagnosis not present

## 2020-12-06 DIAGNOSIS — Z931 Gastrostomy status: Secondary | ICD-10-CM | POA: Diagnosis not present

## 2020-12-06 LAB — CBC
HCT: 38.6 % (ref 36.0–46.0)
Hemoglobin: 12.1 g/dL (ref 12.0–15.0)
MCH: 30.9 pg (ref 26.0–34.0)
MCHC: 31.3 g/dL (ref 30.0–36.0)
MCV: 98.7 fL (ref 80.0–100.0)
Platelets: 249 10*3/uL (ref 150–400)
RBC: 3.91 MIL/uL (ref 3.87–5.11)
RDW: 17.8 % — ABNORMAL HIGH (ref 11.5–15.5)
WBC: 11 10*3/uL — ABNORMAL HIGH (ref 4.0–10.5)
nRBC: 0 % (ref 0.0–0.2)

## 2020-12-06 LAB — GLUCOSE, CAPILLARY
Glucose-Capillary: 111 mg/dL — ABNORMAL HIGH (ref 70–99)
Glucose-Capillary: 129 mg/dL — ABNORMAL HIGH (ref 70–99)
Glucose-Capillary: 141 mg/dL — ABNORMAL HIGH (ref 70–99)

## 2020-12-06 MED ORDER — ALBUTEROL SULFATE (2.5 MG/3ML) 0.083% IN NEBU: 2.5000 mg | INHALATION_SOLUTION | RESPIRATORY_TRACT | 12 refills | Status: DC | PRN

## 2020-12-06 MED ORDER — AMIODARONE HCL 200 MG PO TABS: 200.0000 mg | ORAL_TABLET | Freq: Every day | ORAL | Status: DC

## 2020-12-06 MED ORDER — REVEFENACIN 175 MCG/3ML IN SOLN: 175.0000 ug | Freq: Every day | RESPIRATORY_TRACT | Status: DC

## 2020-12-06 MED ORDER — PROSOURCE TF PO LIQD: 45.0000 mL | Freq: Two times a day (BID) | ORAL | Status: DC

## 2020-12-06 MED ORDER — BUPROPION HCL 75 MG PO TABS: 75.0000 mg | ORAL_TABLET | Freq: Two times a day (BID) | ORAL | Status: DC

## 2020-12-06 MED ORDER — METOPROLOL TARTRATE 25 MG PO TABS: 12.5000 mg | ORAL_TABLET | Freq: Two times a day (BID) | ORAL | Status: DC

## 2020-12-06 MED ORDER — APIXABAN 5 MG PO TABS: 5.0000 mg | ORAL_TABLET | Freq: Two times a day (BID) | ORAL | Status: DC

## 2020-12-06 MED ORDER — ACETAMINOPHEN 160 MG/5ML PO SOLN: 650.0000 mg | ORAL | 0 refills | Status: DC | PRN

## 2020-12-06 MED ORDER — JEVITY 1.2 CAL PO LIQD: 55.0000 mL/h | ORAL | 0 refills | Status: DC

## 2020-12-06 MED ORDER — EZETIMIBE 10 MG PO TABS: 10.0000 mg | ORAL_TABLET | Freq: Every day | ORAL | Status: DC

## 2020-12-06 MED ORDER — ARFORMOTEROL TARTRATE 15 MCG/2ML IN NEBU
15.0000 ug | INHALATION_SOLUTION | Freq: Two times a day (BID) | RESPIRATORY_TRACT | Status: DC
Start: 2020-12-06 — End: 2021-01-15

## 2020-12-06 MED ORDER — HYDRALAZINE HCL 50 MG PO TABS: 50.0000 mg | ORAL_TABLET | Freq: Two times a day (BID) | ORAL | Status: DC

## 2020-12-06 MED ORDER — FREE WATER
200.0000 mL | Status: DC
Start: 2020-12-06 — End: 2021-01-15

## 2020-12-06 MED ORDER — BUDESONIDE 0.5 MG/2ML IN SUSP
0.5000 mg | Freq: Two times a day (BID) | RESPIRATORY_TRACT | 12 refills | Status: DC
Start: 2020-12-06 — End: 2021-03-26

## 2020-12-06 MED ORDER — PANTOPRAZOLE SODIUM 40 MG PO PACK: 40.0000 mg | PACK | Freq: Every day | ORAL | Status: DC

## 2020-12-06 NOTE — Progress Notes (Signed)
Modified Barium Swallow Progress Note  Patient Details  Name: Beverly Li MRN: [id number] Date of Birth: 19-Jun-1945  Today's Date: 12/06/2020  Modified Barium Swallow completed.  Full report located under Chart Review in the Imaging Section.  Brief recommendations include the following:  Clinical Impression  Pt demonstrates a severe, primarily oral dysphagia. Pt has no voluntary lingual control. When given a teaspoon or cup of nectar thick liquid there is adequate labial closure with only mild anterior spillage on the left with a cup sip. Pt holds bolus on the lingual surface with base of tongue/VP seal. In order to initiate transit pt cannot voluntarily lift tip/body of tongue for propulsion; she can only release the posterior seal and allow liquid/puree to spill to pharynx which eventually triggers lingual and pharyngeal stripping and adequate hyolaryngeal movement. Pt has sensed aspiration before the swallow with nectar thick liquids with a large cup sip bolus, but manages a smaller teaspoon (self fed and total assist) nectar bolus with trace penetration before the swallow at the worst and typically no penetration at all. Pt does very well with extra time with puree and even started to demontrate increased lingual activation by the end of the test. Anticipate that pt will progress more rapidly if given frequent therapeutic trials of puree and nectar teaspoon. Recommend pt f/u with SNF SLP as she is planning to d/c today.   Swallow Evaluation Recommendations       SLP Diet Recommendations: Alternative means - long-term;Other (Comment) (trials of puree/nectar teaspoon with SLP only at first. then with family and staff if tolerating)   Liquid Administration via: Spoon   Medication Administration: Via alternative means       Compensations: Slow rate;Small sips/bites;Minimize environmental distractions   Postural Changes: Remain semi-upright after after feeds/meals (Comment);Seated  upright at 90 degrees   Oral Care Recommendations: Oral care QID   Other Recommendations: Have oral suction available   Harlon Ditty, MA CCC-SLP  Acute Rehabilitation Services Office [phone]  Claudine Mouton 12/06/2020,10:01 AM

## 2020-12-06 NOTE — TOC Progression Note (Signed)
Transition of Care [facility]) - Progression Note    Patient Details  Name: Beverly Li MRN: [id number] Date of Birth: [birth year]  Transition of Care [facility]) CM/SW [place], [place] Phone Number: 12/06/2020, 10:10 AM  Clinical Narrative:   CSW worked throughout the day today on SNF for patient. CSW left a message for Admissions at [facility] to check on status of referral, and waited for a call back. CSW spoke with Margarita Grizzle at [facility] that they may potentially be able to offer a bed for the patient down the road, but they do not have anything available at this time right now. CSW also discussed patient with OT who saw patient today about family asking about CIR, and patient still not felt to be at a level appropriate to tolerate CIR at this time. CSW confirmed bed availability at [facility] and initiated insurance authorization request. CSW updated daughter in law via phone and met with husband and son at bedside to discuss and answer questions. Plan is to discharge patient to [facility] in the morning, anticipating discharge for 10 am. CSW to follow.      Barriers to Discharge: Continued Medical Work up  Expected Discharge Plan and Services   In-house Referral: Clinical Social Work   Post Acute Care Choice: [place] Living arrangements for the past 2 months: Single Family Home                                       Social Determinants of Health (SDOH) Interventions    Readmission Risk Interventions No flowsheet data found.

## 2020-12-06 NOTE — Progress Notes (Signed)
RT came by to give pt neb treatment, but pt was off the floor. RT will try back later.

## 2020-12-06 NOTE — Progress Notes (Signed)
PT Cancellation Note  Patient Details Name: Beverly Li MRN: [id number] DOB: 11-Jun-1945   Cancelled Treatment:    Reason Eval/Treat Not Completed: Other (comment). Pt attempted 2x this morning and was at [facility] study. Pt now being prepped to d/c to [facility]. Acute PT to return as able, if pt remains in hospital to progress mobility.  Beverly Li, PT, DPT Acute Rehabilitation Services Pager #: [phone] Office #: [phone]    Beverly Li 12/06/2020, 10:48 AM

## 2020-12-06 NOTE — TOC Transition Note (Signed)
Transition of Care [facility]) - CM/SW Discharge Note   Patient Details  Name: SUSI GOSLIN MRN: [id number] Date of Birth: Aug 27, 1945  [facility]) CM/SW Contact:  Baldemar Lenis, LCSW Phone Number: 12/06/2020, 10:28 AM   Clinical Narrative:   Nurse to call report to [phone], Room 511    Final next level of care: Skilled Nursing Facility Barriers to Discharge: Barriers Resolved   Patient Goals and CMS Choice Patient states their goals for this hospitalization and ongoing recovery are:: Rehab CMS Medicare.gov Compare Post Acute Care list provided to:: Patient Represenative (must comment) Choice offered to / list presented to : Spouse  Discharge Placement              Patient chooses bed at: [facility] Patient to be transferred to facility by: [facility] Name of family member notified: Cheryl Flash Patient and family notified of of transfer: 12/06/20  Discharge Plan and Services In-house Referral: Clinical Social Work   Post Acute Care Choice: Skilled Nursing Facility                               Social Determinants of Health (SDOH) Interventions     Readmission Risk Interventions No flowsheet data found.

## 2020-12-06 NOTE — Discharge Summary (Signed)
Physician Discharge Summary  Beverly Li ZOX:[id number] DOB: 03/18/1946 DOA: 11/13/2020  PCP: Clayborn Heron, MD  Admit date: 11/13/2020 Discharge date: 12/06/2020  Admitted From: Home  Disposition:  SNF   Recommendations for Outpatient Follow-up:  Follow up with [facility] Neurology in 4-6 weeks Follow up with Vascular Surgery, Dr. Edilia Bo in 2 months Please obtain BMP and CBC in 1 week  Monitor fluid status and start furosemide if needed  Please use CPAP at night Please use supplemental O2, 1L during the day, titrate to keep SpO2 >88% Continue tube feeds as described below Aggressive Speech therapy as described below      Home Health: N/A  Equipment/Devices: TBD at SNF  Discharge Condition: Guarded  CODE STATUS: FULL Diet recommendation: Tube feed, see below  Brief/Interim Summary: Beverly Li is a 75 y.o. F with A. fib, HOCM, and HTN who presented with right-sided weakness, facial droop, left gaze preference.  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        PRINCIPAL HOSPITAL DIAGNOSIS: Acute embolic left M1 MCA stroke    Discharge Diagnoses:   Acute stroke Patient was intubated and admitted to ICU.  CTA head and neck showed emergent left M1 MCA occlusion and she was taken to the cath lab for thrombectomy.     8/1: Admitted for stroke, successful thrombectomy 8/2: extubated 8/2: Developed Afib on tele, amiodarone loaded, cardiology consulted 8/7: O2 requirements increased, likely due to fluid overload, atelectasis --> treated with diuretics and stabilized 8/13: Noted to have transient cold right foot, Vascular surgery consulted, US showed good arterial flow, CTA reviewed by Vascular who recommended outpatient follow up 8/19: PEG placed    Discharged on Eliquis and Zetia.  For stroke Core Measures, please see my progress note from 8/23.        Acute hypoxic respiratory  failure Not on O2 at baseline.  But noted to be hypoxic since admission.  After treatment and evaluation, this appears to be from extensive emphysema (although husband aware of no previous diagnosis, and not symptomatically dyspneic prior to admission, it is radiographically "severe" on CT, she is a long time smoker, and she had been treated empirically with ICS/LABA/LAMA prior to admission, indicating high clinical suspicion by her outside doctors).     Here her hypoxia was transiently worse after extubation due to CHF flare, but this was treated and mostly now her O2 is low due to deconditioning, atelectasis.  CT chest showed severe emphysema but no other airspace disease, no effusions.  She was treated with empiric cefepime for leukocytosis, but pneumonia ruled out with imaging, and this was stopped after 5 days.   -Continue Pulmicort, revefenacin, aformoterol       Acute on chronic diastolic CHF Had bilateral infiltrates, swelling, effusions earlier in hospital stay, in setting of Afib, improved with diuresis.   Now euvolemic. Discharged off Lasix, but would have low threshold to restart   Hypertrophic cardiomyopathy  Paroxysmal atrial fibrillation Heart rate controlled Continue Eliquis, amiodarone, metoprolol  Right femoral-popliteal bypass graft stenosis Should have outpatient vascular surgery follow-up in 2-3 months  Hypertension  Abdominal hematoma Prior to admission, improving        Dysphagia Give tube feeds of Osmolite 1.2 at 55 cc/hr.  Prosource BID.  Free water 200 cc q4hrs.   On her modified barium swallow on the day of discharge: Pt demonstrates a severe, primarily oral dysphagia. Pt has  no voluntary lingual control. When given a teaspoon or cup of nectar thick liquid there is adequate labial closure with only mild anterior spillage on the left with a cup sip. Pt holds bolus on the lingual surface with base of tongue/VP seal. In order to initiate transit pt cannot  voluntarily lift tip/body of tongue for propulsion; she can only release the posterior seal and allow liquid/puree to spill to pharynx which eventually triggers lingual and pharyngeal stripping and adequate hyolaryngeal movement. Pt has sensed aspiration before the swallow with nectar thick liquids with a large cup sip bolus, but manages a smaller teaspoon (self fed and total assist) nectar bolus with trace penetration before the swallow at the worst and typically no penetration at all. Pt does very well with extra time with puree and even started to demontrate increased lingual activation by the end of the test. Anticipate that pt will progress more rapidly if given frequent therapeutic trials of puree and nectar teaspoon.            Discharge Instructions  Discharge Instructions     Ambulatory referral to Neurology   Complete by: As directed    An appointment is requested in approximately: 4 weeks For stroke   Diet - low sodium heart healthy   Complete by: As directed    Discharge wound care:   Complete by: As directed    Any remaining wounds can be dressed with a simple bandage.   Increase activity slowly   Complete by: As directed       Allergies as of 12/06/2020       Reactions   Amoxicillin Other (See Comments)   UTI Has patient had a PCN reaction causing immediate rash, facial/tongue/throat swelling, SOB or lightheadedness with hypotension: No Has patient had a PCN reaction causing severe rash involving mucus membranes or skin necrosis: No Has patient had a PCN reaction that required hospitalization: No Has patient had a PCN reaction occurring within the last 10 years: Yes--UTI ONLY If all of the above answers are "NO", then may proceed with Cephalosporin use.   Atenolol Cough   Crestor [rosuvastatin Calcium] Other (See Comments)   Muscle aches   Pravastatin Other (See Comments)   Muscle aches   Sulfa Antibiotics Nausea Only   Codeine    hallucinations         Medication List     STOP taking these medications    buPROPion 150 MG 24 hr tablet Commonly known as: WELLBUTRIN XL Replaced by: buPROPion 75 MG tablet   CALCIUM/D3 ADULT GUMMIES PO   Dexilant 30 MG capsule Generic drug: Dexlansoprazole   diltiazem 30 MG tablet Commonly known as: CARDIZEM   dofetilide 250 MCG capsule Commonly known as: TIKOSYN   HYDROcodone-acetaminophen 5-325 MG tablet Commonly known as: NORCO/VICODIN   midodrine 10 MG tablet Commonly known as: PROAMATINE   nebivolol 10 MG tablet Commonly known as: Bystolic   potassium chloride 10 MEQ tablet Commonly known as: KLOR-CON   ProAir HFA 108 (90 Base) MCG/ACT inhaler Generic drug: albuterol Replaced by: albuterol (2.5 MG/3ML) 0.083% nebulizer solution   Stress B Complex/Iron Tabs   Trelegy Ellipta 100-62.5-25 MCG/INH Aepb Generic drug: Fluticasone-Umeclidin-Vilant   zolpidem 10 MG tablet Commonly known as: AMBIEN       TAKE these medications    acetaminophen 160 MG/5ML solution Commonly known as: TYLENOL Place 20.3 mLs (650 mg total) into feeding tube every 4 (four) hours as needed for mild pain (or temp > 37.5 C (99.5  F)).   albuterol (2.5 MG/3ML) 0.083% nebulizer solution Commonly known as: PROVENTIL Take 3 mLs (2.5 mg total) by nebulization every 4 (four) hours as needed for wheezing or shortness of breath. Replaces: ProAir HFA 108 (90 Base) MCG/ACT inhaler   amiodarone 200 MG tablet Commonly known as: PACERONE Place 1 tablet (200 mg total) into feeding tube daily.   apixaban 5 MG Tabs tablet Commonly known as: ELIQUIS Place 1 tablet (5 mg total) into feeding tube 2 (two) times daily. What changed: See the new instructions.   arformoterol 15 MCG/2ML Nebu Commonly known as: BROVANA Take 2 mLs (15 mcg total) by nebulization 2 (two) times daily.   budesonide 0.5 MG/2ML nebulizer solution Commonly known as: PULMICORT Take 2 mLs (0.5 mg total) by nebulization 2 (two) times daily.    buPROPion 75 MG tablet Commonly known as: WELLBUTRIN Place 1 tablet (75 mg total) into feeding tube 2 (two) times daily. Replaces: buPROPion 150 MG 24 hr tablet   ezetimibe 10 MG tablet Commonly known as: ZETIA Place 1 tablet (10 mg total) into feeding tube daily. What changed: See the new instructions.   feeding supplement (PROSource TF) liquid Place 45 mLs into feeding tube 2 (two) times daily.   feeding supplement (JEVITY 1.2 CAL) Liqd Place 55 mL/hr into feeding tube continuous.   free water Soln Place 200 mLs into feeding tube every 4 (four) hours.   hydrALAZINE 50 MG tablet Commonly known as: APRESOLINE Place 1 tablet (50 mg total) into feeding tube 2 (two) times daily. What changed:  medication strength See the new instructions.   metoprolol tartrate 25 MG tablet Commonly known as: LOPRESSOR Place 0.5 tablets (12.5 mg total) into feeding tube 2 (two) times daily.   pantoprazole sodium 40 mg Pack Commonly known as: PROTONIX Place 20 mLs (40 mg total) into feeding tube at bedtime.   revefenacin 175 MCG/3ML nebulizer solution Commonly known as: YUPELRI Take 3 mLs (175 mcg total) by nebulization daily.               Discharge Care Instructions  (From admission, onward)           Start     Ordered   12/06/20 0000  Discharge wound care:       Comments: Any remaining wounds can be dressed with a simple bandage.   12/06/20 1010            Follow-up Information     [facility]. Schedule an appointment as soon as possible for a visit in 1 month(s).   Specialty: Neurology Contact information: [address] [place] [address] [phone]               Allergies  Allergen Reactions   Amoxicillin Other (See Comments)    UTI Has patient had a PCN reaction causing immediate rash, facial/tongue/throat swelling, SOB or lightheadedness with hypotension: No Has patient had a PCN reaction causing  severe rash involving mucus membranes or skin necrosis: No Has patient had a PCN reaction that required hospitalization: No Has patient had a PCN reaction occurring within the last 10 years: Yes--UTI ONLY If all of the above answers are "NO", then may proceed with Cephalosporin use.    Atenolol Cough   Crestor [Rosuvastatin Calcium] Other (See Comments)    Muscle aches   Pravastatin Other (See Comments)    Muscle aches   Sulfa Antibiotics Nausea Only   Codeine     hallucinations    Consultations:  Critical Care Neurology Vascular surgery Cardiology   Procedures/Studies: DG Chest 1 View  Result Date: 11/14/2020 CLINICAL DATA:  Respiratory failure, endotracheal intubation EXAM: CHEST  1 VIEW COMPARISON:  11/13/2020 FINDINGS: Endotracheal tube seen 6.8 cm above the carina. Nasogastric tube extends into the upper abdomen beyond the margin of the examination. Pulmonary insufflation is normal and symmetric. Chronic interstitial changes are noted at the lung bases bilaterally. No superimposed confluent pulmonary infiltrate. No pneumothorax or pleural effusion. Prominent skin fold noted at the right apex. Capsular calcification related to right breast implant is again noted. Mild cardiomegaly is stable. Pulmonary vascularity is normal. IMPRESSION: Support tubes in appropriate position. Preserved pulmonary insufflation. Stable cardiomegaly. Electronically Signed   By: Helyn Numbers MD   On: 11/14/2020 04:40   DG Chest 1 View  Result Date: 11/13/2020 CLINICAL DATA:  Intubation EXAM: CHEST  1 VIEW COMPARISON:  04/27/2020 x-ray, MRI 09/01/2018 FINDINGS: Endotracheal tube terminates approximately 4.8 cm above the carina. Mild cardiomegaly. Atherosclerotic calcification of the aortic knob. Diffusely increased interstitial markings within the lung bases. Area of lucency at the left costophrenic angle likely reflecting a skin fold. A small pneumothorax would be difficult to exclude. Osseous structures are  demineralized. Chronic vertebral body height loss at approximately T7 compatible with site of prior discitis-osteomyelitis. IMPRESSION: 1. Endotracheal tube terminates approximately 4.8 cm above the carina. 2. Diffusely increased interstitial markings within the lung bases may reflect edema versus infection. 3. Area of relative lucency at the left costophrenic angle likely reflecting a skin fold. A small pneumothorax would be difficult to exclude. Recommend repeat chest x-ray after patient positioning, preferably upright. Electronically Signed   By: Duanne Guess D.O.   On: 11/13/2020 16:54   CT CHEST W CONTRAST  Result Date: 12/05/2020 CLINICAL DATA:  Acute hypoxic respiratory failure, abnormal chest x-ray. EXAM: CT CHEST WITH CONTRAST TECHNIQUE: Multidetector CT imaging of the chest was performed during intravenous contrast administration. CONTRAST:  50mL OMNIPAQUE IOHEXOL 350 MG/ML SOLN COMPARISON:  None. FINDINGS: Cardiovascular: There is extensive multi-vessel coronary artery calcification. There is mild to moderate cardiomegaly with left ventricular hypertrophy. Hypertrophy of the left ventricle asymmetrically involving the ventricular septum results in marked narrowing of the left ventricular outflow tract. This is best appreciated on axial image # 96/3 and sagittal image # 100/7. No pericardial effusion. There is marked enlargement of the a central pulmonary arteries in keeping with changes of pulmonary arterial hypertension. There is adequate opacification of the pulmonary arterial tree and no intraluminal filling defect is identified to suggest acute pulmonary embolism. Moderate atherosclerotic calcification noted within the thoracic aorta. The thoracic aorta is dilated measuring 4.3 cm in diameter in its ascending segment, 3.5 cm in diameter at the arch, and 2.5 cm in diameter at the level of the left atrium. Mediastinum/Nodes: The visualized thyroid is unremarkable. No pathologic thoracic  adenopathy. The esophagus is unremarkable. Lungs/Pleura: Severe emphysema. Small bilateral pleural effusions have developed with bibasilar compressive atelectasis. No superimposed focal pulmonary infiltrate. No pneumothorax. Central airways are widely patent. Upper Abdomen: No acute abnormality. Musculoskeletal: No acute bone abnormality. No lytic or blastic bone lesion identified. IMPRESSION: Severe emphysema. Small bilateral pleural effusions with bibasilar compressive atelectasis. Extensive multi-vessel coronary artery calcification. Cardiomegaly with asymmetric septal hypertrophy resulting in impingement of the left ventricular outflow tract. Correlation with echocardiography may be helpful for further evaluation. Morphologic changes in keeping with pulmonary arterial hypertension. Dilation of the a thoracic aorta measuring 4.3 cm in greatest dimension within the ascending segment. Recommend annual  imaging followup by CTA or MRA. This recommendation follows 2010 ACCF/AHA/AATS/ACR/ASA/SCA/SCAI/SIR/STS/SVM Guidelines for the Diagnosis and Management of Patients with Thoracic Aortic Disease. Circulation. 2010; 121: G992-E268. Aortic aneurysm NOS (ICD10-I71.9) Aortic Atherosclerosis (ICD10-I70.0) and Emphysema (ICD10-J43.9). Aortic aneurysm NOS (ICD10-I71.9). Electronically Signed   By: Helyn Numbers M.D.   On: 12/05/2020 00:49   MR BRAIN WO CONTRAST  Result Date: 11/14/2020 CLINICAL DATA:  Neuro deficit, acute stroke suspected. EXAM: MRI HEAD WITHOUT CONTRAST TECHNIQUE: Multiplanar, multiecho pulse sequences of the brain and surrounding structures were obtained without intravenous contrast. COMPARISON:  CT exams from yesterday. FINDINGS: Motion limited study.  Within this limitation: Brain: Acute infarcts involving the left basal ganglia (including the caudate and subinsular white matter), left insula, overlying left frontal lobe and high left parasagittal frontal lobe cortex, compatible with left ACA and MCA  territory infarcts. Small areas of acute infarct in the left frontal lobe white matter and punctate left parietal cortical infarct, also left ACA and MCA territories. Associated edema without mass effect. Additional punctate acute infarcts in right frontal and parietal cortex. Small acute right cerebellar infarct. No hydrocephalus. No evidence of acute hemorrhage, mass lesion, midline shift. Remote right cerebellar lacunar infarct. Additional mild scattered T2 hyperintensities within the white matter, likely related to chronic microvascular ischemic disease. Vascular: Better evaluated on recent catheter arteriogram and CTA. Skull and upper cervical spine: Normal marrow signal. Sinuses/Orbits: Clear sinuses.  Unremarkable orbits. Other: No sizable mastoid effusions. IMPRESSION: Motion limited study. 1. 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 Associated edema without mass effect. 2. Additional scattered punctate acute infarcts in the right frontoparietal cortex and small acute infarct in the right cerebellum. Electronically Signed   By: Feliberto Harts MD   On: 11/14/2020 11:34   CT ANGIO AO+BIFEM W & OR WO CONTRAST  Result Date: 11/26/2020 CLINICAL DATA:  75 year old female presenting 11/13/2020 with code stroke, status post left common femoral artery access and treatment of left MCA EXAM: CT ANGIOGRAPHY OF ABDOMINAL AORTA WITH ILIOFEMORAL RUNOFF TECHNIQUE: Multidetector CT imaging of the abdomen, pelvis and lower extremities was performed using the standard protocol during bolus administration of intravenous contrast. Multiplanar CT image reconstructions and MIPs were obtained to evaluate the vascular anatomy. CONTRAST:  OMNIPAQUE IOHEXOL 350 MG/ML SOLN COMPARISON:  None. FINDINGS: VASCULAR Aorta: Advanced atherosclerotic changes of the abdominal aorta. No dissection. No periaortic fluid. Renals: - Right: Right renal origin not imaged though is patent. - Left: Left renal artery with  atherosclerotic changes at the origin and remains patent. IMA: Inferior mesenteric artery is patent. Right lower extremity: Moderate to advanced atherosclerotic changes throughout the right iliac system including CIA, hypogastric artery, EIA. No high-grade stenosis identified. Hypogastric remains patent though with moderate to advanced disease. Surgical changes at the right common femoral artery, with ectasias a and prior femoropopliteal bypass. Profunda femoris and the thigh branches patent. The proximal femoropopliteal bypass is patent. There is decreased contrast opacification in the distal third, and particularly at the distal anastomosis, with a nondiagnostic study at the popliteal region. No contrast visualized within the tibial arteries. Native SFA occlusion. Left lower extremity: Moderate to advanced atherosclerotic changes of the left iliac system, including the CIA, hypogastric artery, EIA. No high-grade stenosis identified within the iliac segment. Questionable focal 50% stenosis of the left EIA secondary to mixed calcified and soft plaque. There is likely 50% stenosis of the left hypogastric artery just beyond the origin. Ectasias of the left common femoral artery, may be related to  prior endarterectomy or native ectasias. There is developing stenosis of the common femoral artery, with presumed high-grade stenosis. Profunda femoris and the thigh branches are patent. Native left SFA occlusion, which was evident on the final limited angiogram of 11/13/2020. 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 Reconstitution of the popliteal artery via collateral flow. No significant calcifications of the tibial arteries. The origin of all 3 tibial arteries is patent. There is decreased contrast within the distal anterior tibial artery and peroneal artery, with contrast throughout the length of the posterior tibial artery to the ankle. Veins: Unremarkable appearance of the venous system. Review of the MIP  images confirms the above findings. NON-VASCULAR Visualized liver and gallbladder unremarkable. Visualized pancreas unremarkable. Adrenals/Urinary Tract: Visualized aspects of the adrenal glands unremarkable. Left kidney incompletely imaged.  No evidence of hydronephrosis. Right kidney without hydronephrosis or perinephric stranding. No evidence of nephrolithiasis. Unremarkable urinary bladder. Stomach/Bowel: Visualized small bowel unremarkable with no abnormal distension. No air-fluid levels or transition identified. Visualized colon without transition point or focal inflammatory changes. Diverticula of the left colon. No significant stool burden. Lymphatic: No adenopathy. Mesenteric: No mesenteric lymphadenopathy.  No free fluid. Reproductive: Unremarkable uterus Other: Fluid collection within the left-sided abdominal wall musculature, compatible with electronic record history of prior spontaneous hematoma. The greatest axial dimension is approximately 7.7 cm. No evidence of abdominal hernia. Musculoskeletal: Incompletely imaged surgical changes of the lumbar region. No acute displaced fracture. Osteopenia. Degenerative changes of the hips. IMPRESSION: CT angiogram is suspicious for, though not diagnostic of, occlusion of distal right femoropopliteal bypass. Low cardiac output and/or distal high-grade stenosis/resistance could also contribute to un-opacified distal bypass conduit. High-grade stenosis of the left common femoral artery, with associated atherosclerotic changes and ectasia. Additionally, there is native left SFA occlusion with reconstitution of the popliteal artery via profunda femoris collateral flow. Absence of right-sided tibial arterial opacification. On the left, the posterior tibial artery is patent to the ankle, with the distal left AT and peroneal artery unopacified, potentially secondary to late arrival of the contrast versus occlusion. Moderate to advanced aortic atherosclerosis and  bilateral iliac arterial disease, with no evidence of high-grade aorta iliac disease. Questionable 50% narrowing in the proximal left EIA and hypogastric artery. Aortic Atherosclerosis (ICD10-I70.0). Additional ancillary findings as above. Signed, Yvone Neu. Reyne Dumas, RPVI [facility] Radiology Electronically Signed   By: Gilmer Mor D.O.   On: 11/26/2020 10:22   IR CT Head Ltd  Result Date: 11/15/2020 INDICATION: New onset left gaze deviation, right-sided hemiplegia, and global aphasia. 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 EXAM: 1. EMERGENT LARGE VESSEL OCCLUSION THROMBOLYSIS (anterior CIRCULATION) COMPARISON:  CT angiogram of the head and neck of November 13, 2020. MEDICATIONS: Ancef 2 g IV antibiotic was administered within 1 hour of the procedure. ANESTHESIA/SEDATION: General anesthesia. CONTRAST:  Omnipaque 300 approximately 60 mL. FLUOROSCOPY TIME:  Fluoroscopy Time: 27 minutes 54 seconds (628 mGy). COMPLICATIONS: None immediate. TECHNIQUE: Following a full explanation of the procedure along with the potential associated complications, an informed witnessed consent was obtained. The risks of intracranial hemorrhage of 10%, worsening neurological deficit, ventilator dependency,death and inability to revascularize were all reviewed in detail with the patient's husband. The patient was then put under general anesthesia by the Department of Anesthesiology at [facility]. The left groin was prepped and draped in the usual sterile fashion. Thereafter using modified Seldinger technique, transfemoral access into the left common femoral artery was obtained without difficulty. Over a 0.035  inch guidewire an 8 [place] Pinnacle 10 cm sheath was inserted. Through this, and also over a 0.035 inch guidewire a a 5.5 [place] 125 cm Berenstein support catheter inside of an 087 balloon guide catheter which had been prepped with 50% contrast and 50% heparinized  saline infusion was advanced to the aortic arch region, and selectively positioned in the left common carotid artery just proximal to the left common carotid bifurcation. The support catheter, and the guidewire were removed. Good aspiration obtained from the hub of the balloon guide catheter. Arteriograms were then performed centered extra cranially and intracranially. FINDINGS: The left common carotid arteriogram demonstrates the left external carotid artery and its major branches to be widely patent. The left internal carotid artery at the bulb has mild to moderate stenosis due to calcified arteriosclerotic plaque. No evidence of intraluminal filling defects or of ulcerations was evident. More distally the left internal carotid artery is seen to ascend to the cranial skull base. The petrous, cavernous and the supraclinoid segments are widely patent. The left anterior cerebral artery opacifies into the capillary and venous phases. Prompt opacification via the anterior communicating artery of the right anterior cerebral artery and the right middle cerebral artery is seen into the capillary and venous phases. The left middle cerebral artery demonstrates complete occlusion in the mid M1 segment. PROCEDURE: Over a 0.014 inch standard Synchro micro guidewire with a J-tip configuration, a combination of an 071 32 cm Zoom aspiration catheter with the balloon guide catheter were advanced to the supraclinoid left ICA. The balloon guide was situated at the cervical petrous junction. Using a torque device, access was obtained into the M2 M3 region with the micro guidewire, followed by the microcatheter. The guidewire was removed. Poor aspiration obtained from the hub of the microcatheter. This was then retrieved more proximally. 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 The micro guidewire and the microcatheter were retrieved more proximally. Using  constant aspiration with Penumbra aspiration device at the hub of the Zoom aspiration catheter, and proximal flow arrest in the left internal carotid artery, the Zoom aspiration catheter was advanced more distally into the dominant inferior division branch. There it was maintained for approximately 2 minutes. Thereafter, the Zoom aspiration catheter was gently retrieved and removed. Aspiration was continued with a 20 mL syringe at the hub of the balloon guide catheter. Following flow reversal, a control arteriogram performed through the balloon guide catheter in the proximal left internal carotid artery demonstrated complete revascularization of the left middle cerebral artery into the capillary and venous phases. The distal MCA branches were widely patent without evidence of intraluminal filling defects. The left anterior cerebral artery demonstrated wide patency with no change in the contralateral right anterior circulation via the anterior communicating artery. The balloon guide catheter was then removed. The right common carotid artery was then selected over an 0.035 inch Roadrunner guidewire with a 5 [place] diagnostic catheter. An arteriogram performed demonstrated severe stenosis at the origin of the right external carotid artery though its branches appeared widely patent. An eccentric smooth plaque was seen projecting in the right common carotid artery proximal to the bifurcation due to an anteriorly positioned plaque. Right internal carotid artery at the bulb is patent with delayed ascent of contrast to the cranial skull base consistent with angiographic string sign. CT of the brain demonstrated no evidence of intracranial hemorrhage or mass effect. The left femoral sheath was removed with hemostasis achieved  with manual compression. Distal pulses remained palpable in both feet unchanged at the end of the procedure. Patient was left intubated due to patient being unresponsive due to the stroke, and to  protect the airway. Patient was then transferred to PACU and then neuro ICU for post revascularization management. 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 PLAN: Follow-up in the clinic 4 weeks post discharge. Electronically Signed   By: Julieanne Cotton M.D.   On: 11/14/2020 10:13   DG CHEST PORT 1 VIEW  Result Date: 12/01/2020 CLINICAL DATA:  Shortness of breath EXAM: PORTABLE CHEST 1 VIEW COMPARISON:  11/25/2020 FINDINGS: Single frontal view of the chest demonstrates stable enlargement of the cardiac silhouette. The enteric catheter seen previously has been removed in the interim. There are bibasilar veiling opacities consistent with consolidation and small effusions, increased since prior exam. No evidence of pneumothorax. No acute bony abnormalities. IMPRESSION: 1. Bibasilar veiling opacities, slightly increased since prior study, consistent with underlying consolidation and/or effusions. Electronically Signed   By: Sharlet Salina M.D.   On: 12/01/2020 18:13   DG Chest Port 1 View  Result Date: 11/25/2020 CLINICAL DATA:  Hypoxia, code stroke. EXAM: PORTABLE CHEST 1 VIEW COMPARISON:  Chest x-ray dated 11/24/2020 FINDINGS: Stable cardiomegaly.  Enteric tube passes below the diaphragm. Continued bibasilar airspace opacities. Continued central pulmonary vascular congestion. No pneumothorax is seen. IMPRESSION: 1. Overall, stable chest x-ray.  No new lung findings. 2. Cardiomegaly with central pulmonary vascular congestion suggesting mild CHF/volume overload. 3. Bibasilar opacities, of uncertain chronicity. Differential includes pneumonia, atelectasis, asymmetric interstitial edema and chronic interstitial fibrosis. Electronically Signed   By:  Bary Richard M.D.   On: 11/25/2020 10:58   DG CHEST PORT 1 VIEW  Result Date: 11/24/2020 CLINICAL DATA:  Abnormal respiration EXAM: PORTABLE CHEST 1 VIEW COMPARISON:  11/22/2020 FINDINGS: Increased density at the right lung base. Persistent retrocardiac density. Pulmonary vascular congestion. Stable cardiomediastinal contours. Enteric tube passes below the diaphragm with tip out of field of view. IMPRESSION: Increased density at the right lung base may reflect atelectasis/consolidation and possible pleural effusion. Persistent retrocardiac atelectasis/consolidation. Pulmonary vascular congestion. Electronically Signed   By: Guadlupe Spanish M.D.   On: 11/24/2020 08:42   DG Chest Port 1 View  Result Date: 11/22/2020 CLINICAL DATA:  75 year old female with shortness of breath. Status post code stroke with E LV 11/13/2020. EXAM: PORTABLE CHEST 1 VIEW COMPARISON:  Portable chest 11/21/2020 and earlier. FINDINGS: Portable AP semi upright view at 0938 hours. Stable cardiomegaly and mediastinal contours. Calcified aortic atherosclerosis. Enteric feeding tube courses to the abdomen as before, tip not included. Stable mild veiling opacity in both lower lungs. Pulmonary vascularity appears within normal limits. No pneumothorax. No air bronchograms. Hypo ventilation is greater on the left side. Lung base ventilation has mildly improved bilaterally since 11/19/2020. Paucity of bowel gas. IMPRESSION: Cardiomegaly and evidence of small bilateral pleural effusions with atelectasis, improved since 11/19/2020. Electronically Signed   By: Odessa Fleming M.D.   On: 11/22/2020 10:06   DG Chest Port 1 View  Result Date: 11/21/2020 CLINICAL DATA:  Shortness of breath. EXAM: PORTABLE CHEST 1 VIEW COMPARISON:  November 19, 2020. FINDINGS: Stable cardiomegaly. Feeding tube is seen entering stomach. No pneumothorax is noted. Mild bibasilar subsegmental atelectasis is noted. Bony thorax is  unremarkable. IMPRESSION: Mild bibasilar subsegmental  atelectasis. Aortic Atherosclerosis (ICD10-I70.0). Electronically Signed   By: Lupita Raider M.D.   On: 11/21/2020 12:01   DG CHEST PORT 1 VIEW  Result Date: 11/19/2020 CLINICAL DATA:  Tachypnea EXAM: PORTABLE CHEST 1 VIEW COMPARISON:  11/13/2020 FINDINGS: Cardiomegaly and aortic tortuosity. Haziness of the lower chest which could be atelectasis or pneumonia with small pleural effusions. There is worsening on the right at least where the diaphragm is no longer discretely visualized. No suspected pulmonary edema. No pneumothorax. Feeding tube at least reaches the stomach. Extensive artifact from EKG leads. IMPRESSION: Progressive hazy opacity at the bases which could be infection or atelectasis with small pleural effusions. Electronically Signed   By: Marnee Spring M.D.   On: 11/19/2020 04:18   DG CHEST PORT 1 VIEW  Result Date: 11/18/2020 CLINICAL DATA:  Respiratory distress EXAM: PORTABLE CHEST 1 VIEW COMPARISON:  11/14/2020 FINDINGS: Interval extubation. The lungs remain well expanded though slightly decreased since prior examination. Partial left lower lobe collapse has developed. Tiny left pleural effusion is now present. Mild right basilar atelectasis noted. Mild cardiomegaly is stable. The thoracic aorta is tortuous and ectatic, likely stable when accounting for changes in patient positioning. Nasoenteric feeding tube extends into the upper abdomen beyond the margin of the examination. Pulmonary vascularity is normal. No acute bone abnormality. Partially calcified right breast implant again noted. IMPRESSION: Interval extubation with slight interval decrease in pulmonary insufflation. The lungs remain well expanded, however. Interval development of partial left lower lobe collapse and bibasilar atelectasis. Stable cardiomegaly. Grossly stable tortuosity and aneurysmal dilation of the thoracic aorta. Electronically Signed   By: Helyn Numbers MD   On: 11/18/2020 13:24   DG CHEST PORT 1  VIEW  Result Date: 11/13/2020 CLINICAL DATA:  Intubated, enteric catheter placement, CVA EXAM: PORTABLE CHEST 1 VIEW COMPARISON:  The 122 at 4:42 p.m. FINDINGS: 2 supine frontal views of the chest are obtained. Endotracheal tube overlies tracheal air column, tip just below thoracic inlet. Enteric catheter passes below diaphragm tip excluded by collimation. Cardiac silhouette is enlarged but stable. Chronic central vascular congestion without airspace disease, effusion, or pneumothorax. Prominent skin fold overlies the right apex. IMPRESSION: 1. Support devices as above. 2. Chronic vascular congestion.  No acute airspace disease. Electronically Signed   By: Sharlet Salina M.D.   On: 11/13/2020 18:34   DG Abd Portable 1V  Result Date: 11/15/2020 CLINICAL DATA:  Feeding tube localization. EXAM: PORTABLE ABDOMEN - 1 VIEW COMPARISON:  11/13/2020 FINDINGS: Soft feeding tube enters the stomach with its tip in body antrum junction region. Gas pattern is unremarkable. IMPRESSION: Soft feeding tube in the stomach with its tip at the body antrum junction region. Electronically Signed   By: Paulina Fusi M.D.   On: 11/15/2020 13:26   DG Abd Portable 1V  Result Date: 11/13/2020 CLINICAL DATA:  Enteric catheter placement EXAM: PORTABLE ABDOMEN - 1 VIEW COMPARISON:  03/08/2015 FINDINGS: Frontal view of the abdomen and pelvis was obtained, excluding the lower pelvis by collimation. Enteric catheter passes below diaphragm, tip projecting over region of the gastric antrum. Bowel gas pattern is unremarkable. Excreted contrast within the kidneys from preceding interventional procedure. IMPRESSION: 1. Enteric catheter tip projecting over the gastric antrum. Electronically Signed   By: Sharlet Salina M.D.   On: 11/13/2020 18:32   DG Swallowing Func-Speech Pathology  Result Date: 12/06/2020 Table formatting from the original result was not included. Objective Swallowing Evaluation: Type of Study: MBS-Modified Barium Swallow  Study  Patient Details Name: Beverly Li MRN: [id number] Date of Birth: 1945-08-23 Today's Date: 12/06/2020 Time: SLP Start Time (ACUTE ONLY): 0930 -SLP Stop Time (ACUTE ONLY): 0955 SLP Time Calculation (min) (ACUTE ONLY): 25 min Past Medical History: Past Medical History: Diagnosis Date  Anxiety   Arthritis   "some in my lower back; probably elbows, knees" (11/18/2017)  Atrial fibrillation (HCC)   Bell's palsy   when pt. was 75 yrs old, when under stress the left side of face will droop.  Complication of anesthesia   "vascular OR 2016; BP bottomed out; couldn't get it regulated; ended up in ICU for DAYS" (11/18/2017)  GERD (gastroesophageal reflux disease)   History of kidney stones   Hypertension   Hypertrophic cardiomyopathy (HCC)   severe LV basilar hypertrophy witn no evidence of significant outflow tract obstruction, EF 65-70%, mild LAE, mild TR, grade 1a diastolic dysfunction 05/15/10 (Dr. Donato Schultz) (Atrial Septal Hypertrophy pattern)-- Intra-op TEE with dsignificant outflow tract obstruction - AI, MR & TR  Insomnia   Mild aortic sclerosis   Osteopenia   Peripheral vascular disease (HCC)   Syncope   , Vagal Past Surgical History: Past Surgical History: Procedure Laterality Date  AUGMENTATION MAMMAPLASTY Bilateral   BACK SURGERY    CARDIAC CATHETERIZATION N/A 05/07/2016  Procedure: Left Heart Cath and Coronary Angiography;  Surgeon: Kathleene Hazel, MD;  Location: [facility] INVASIVE CV LAB;  Service: Cardiovascular;  Laterality: N/A;  CARDIOVERSION N/A 09/24/2017  Procedure: CARDIOVERSION;  Surgeon: Laurey Morale, MD;  Location: [facility] ENDOSCOPY;  Service: Cardiovascular;  Laterality: N/A;  DILATION AND CURETTAGE OF UTERUS    ENDARTERECTOMY FEMORAL Right 03/02/2015  Procedure: ENDARTERECTOMY RIGHT FEMORAL;  Surgeon: Chuck Hint, MD;  Location: [facility] OR;  Service: Vascular;  Laterality: Right;  ESOPHAGOGASTRODUODENOSCOPY (EGD) WITH PROPOFOL N/A 12/01/2020  Procedure: ESOPHAGOGASTRODUODENOSCOPY (EGD) WITH  PROPOFOL;  Surgeon: Diamantina Monks, MD;  Location: [facility] ENDOSCOPY;  Service: General;  Laterality: N/A;  FACIAL COSMETIC SURGERY Left 2002  "related to Bell's Palsy @ age 53; left eye/side of face droopy; tried to make area symmetrical"  FEMORAL-POPLITEAL BYPASS GRAFT Right 03/02/2015  Procedure: BYPASS GRAFT FEMORAL-BELOW KNEE POPLITEAL ARTERY;  Surgeon: Chuck Hint, MD;  Location: [facility] OR;  Service: Vascular;  Laterality: Right;  INGUINAL HERNIA REPAIR Bilateral 2002  IR ANGIO INTRA EXTRACRAN SEL COM CAROTID INNOMINATE UNI L MOD SED  11/16/2020  IR CT HEAD LTD  11/13/2020  IR PERCUTANEOUS ART THROMBECTOMY/INFUSION INTRACRANIAL INC DIAG ANGIO  11/13/2020  OVARIAN CYST REMOVAL Left   PEG PLACEMENT N/A 12/01/2020  Procedure: PERCUTANEOUS ENDOSCOPIC GASTROSTOMY (PEG) PLACEMENT;  Surgeon: Diamantina Monks, MD;  Location: [facility] ENDOSCOPY;  Service: General;  Laterality: N/A;  PERIPHERAL VASCULAR CATHETERIZATION N/A 01/16/2015  Procedure: Abdominal Aortogram;  Surgeon: Chuck Hint, MD;  Location: [facility] INVASIVE CV LAB;  Service: Cardiovascular;  Laterality: N/A;  POSTERIOR LUMBAR FUSION  2015  "have plates and screws in there"  RADIOLOGY WITH ANESTHESIA N/A 11/13/2020  Procedure: IR WITH ANESTHESIA;  Surgeon: Radiologist, Medication, MD;  Location: [facility] OR;  Service: Radiology;  Laterality: N/A;  TONSILLECTOMY   HPI: 75 year old female with proximal A. 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 Intubated 8/1, extubated 8/2. MRI shows Acute infarcts involving the left basal ganglia, left insula, overlying  left frontal lobe and high left parasagittal frontal lobe cortex,  compatible with left ACA and MCA territory infarcts. Small areas of  acute infarct in the left frontal lobe white matter and punctate  left  parietal cortical infarct, also left ACA and MCA territories.  Associated edema without mass effect. Additional punctate acute  infarcts in right frontal and parietal cortex. Small acute right   cerebellar infarct. Partial lung collapse 8/7 requiring temporary CPAP.  No data recorded Assessment / Plan / Recommendation CHL IP CLINICAL IMPRESSIONS 12/06/2020 Clinical Impression Pt demonstrates a severe, primarily oral dysphagia. Pt has no voluntary lingual control. When given a teaspoon or cup of nectar thick liquid there is adequate labial closure with only mild anterior spillage on the left with a cup sip. Pt holds bolus on the lingual surface with base of tongue/VP seal. In order to initiate transit pt cannot voluntarily lift tip/body of tongue for propulsion; she can only release the posterior seal and allow liquid/puree to spill to pharynx which eventually triggers lingual and pharyngeal stripping and adequate hyolaryngeal movement. Pt has sensed aspiration before the swallow with nectar thick liquids with a large cup sip bolus, but manages a smaller teaspoon (self fed and total assist) nectar bolus with trace penetration before the swallow at the worst and typically no penetration at all. Pt does very well with extra time with puree and even started to demontrate increased lingual activation by the end of the test. Anticipate that pt will progress more rapidly if given frequent therapeutic trials of puree and nectar teaspoon. Recommend pt f/u with SNF SLP as she is planning to d/c today. SLP Visit Diagnosis Dysphagia, oropharyngeal phase (R13.12);Aphasia (R47.01) Attention and concentration deficit following -- Frontal lobe and executive function deficit following -- Impact on safety and function Moderate aspiration risk   CHL IP TREATMENT RECOMMENDATION 12/06/2020 Treatment Recommendations Therapy as outlined in treatment plan below   Prognosis 12/06/2020 Prognosis for Safe Diet Advancement Good Barriers to Reach Goals Language deficits;Severity of deficits Barriers/Prognosis Comment -- CHL IP DIET RECOMMENDATION 12/06/2020 SLP Diet Recommendations Alternative means - long-term;Other (Comment) Liquid  Administration via Spoon Medication Administration Via alternative means Compensations Slow rate;Small sips/bites;Minimize environmental distractions Postural Changes Remain semi-upright after after feeds/meals (Comment);Seated upright at 90 degrees   CHL IP OTHER RECOMMENDATIONS 12/06/2020 Recommended Consults -- Oral Care Recommendations Oral care QID Other Recommendations Have oral suction available   CHL IP FOLLOW UP RECOMMENDATIONS 12/06/2020 Follow up Recommendations [facility] D[facility] 12/06/2020 Speech Therapy Frequency (ACUTE ONLY) min 2x/week Treatment Duration 2 weeks      CHL IP ORAL PHASE 12/06/2020 Oral Phase Impaired Oral - Pudding Teaspoon -- Oral - Pudding Cup -- Oral - Honey Teaspoon -- Oral - Honey Cup -- Oral - Nectar Teaspoon Delayed oral transit;Lingual/palatal residue;Holding of bolus;Reduced posterior propulsion;Weak lingual manipulation;Right pocketing in lateral sulci Oral - Nectar Cup Delayed oral transit;Lingual/palatal residue;Holding of bolus;Reduced posterior propulsion;Weak lingual manipulation;Right anterior bolus loss;Right pocketing in lateral sulci Oral - Nectar Straw NT Oral - Thin Teaspoon -- Oral - Thin Cup -- Oral - Thin Straw -- Oral - Puree Holding of bolus;Reduced posterior propulsion;Delayed oral transit Oral - Mech Soft -- Oral - Regular -- Oral - Multi-Consistency -- Oral - Pill -- Oral Phase - Comment --  CHL IP PHARYNGEAL PHASE 12/06/2020 Pharyngeal Phase Impaired Pharyngeal- Pudding Teaspoon -- Pharyngeal -- Pharyngeal- Pudding Cup -- Pharyngeal -- Pharyngeal- Honey Teaspoon -- Pharyngeal -- Pharyngeal- Honey Cup -- Pharyngeal -- Pharyngeal- Nectar Teaspoon Delayed swallow initiation-vallecula;Penetration/Aspiration before swallow Pharyngeal Material enters airway, remains ABOVE vocal cords and not ejected out;Material does not enter airway;Material enters airway, remains ABOVE vocal cords then ejected out Pharyngeal- Nectar Cup  Delayed  swallow initiation-pyriform sinuses;Penetration/Aspiration before swallow Pharyngeal Material enters airway, passes BELOW cords and not ejected out despite cough attempt by patient Pharyngeal- Nectar Straw -- Pharyngeal -- Pharyngeal- Thin Teaspoon -- Pharyngeal -- Pharyngeal- Thin Cup -- Pharyngeal -- Pharyngeal- Thin Straw -- Pharyngeal -- Pharyngeal- Puree Delayed swallow initiation-vallecula;Delayed swallow initiation-pyriform sinuses Pharyngeal -- Pharyngeal- Mechanical Soft -- Pharyngeal -- Pharyngeal- Regular -- Pharyngeal -- Pharyngeal- Multi-consistency -- Pharyngeal -- Pharyngeal- Pill -- Pharyngeal -- Pharyngeal Comment --  No flowsheet data found. DeBlois, Riley Nearing 12/06/2020, 10:03 AM              EEG adult  Result Date: 11/30/2020 [name], MD     11/30/2020  3:39 PM Patient Name: Beverly Li MRN: [id number] Epilepsy Attending: [name] Referring Physician/Provider: [name] Date: 11/30/2020 Duration: 21.29 mins Patient history: 84 old female with left ACA/MCA infarct.  Had a seizure-like episode lasting about 30 seconds.  EEG to evaluate for seizures. Level of alertness: Awake AEDs during EEG study: None Technical aspects: This EEG study was done with scalp electrodes positioned according to the 10-20 International system of electrode placement. Electrical activity was acquired at a sampling rate of 500Hz  and reviewed with a high frequency filter of 70Hz  and a low frequency filter of 1Hz . EEG data were recorded continuously and digitally stored. Description: The posterior dominant rhythm consists of 8 Hz activity of moderate voltage (25-35 uV) seen predominantly in posterior head regions, symmetric and reactive to eye opening and eye closing. EEG showed continuous 5 to 6 Hz theta as well as intermittent 2 to 3 Hz delta slowing in left hemisphere.  Hyperventilation and photic stimulation were not performed.   ABNORMALITY - Continuous slow, left hemisphere  IMPRESSION: This study is suggestive of cortical dysfunction in left hemisphere, likely secondary to underlying strokes.  No seizures or definite epileptiform discharges were seen throughout the recording. If suspicion for ictal-interictal activity remains a concern, a prolonged study can be considered. [name]   ECHOCARDIOGRAM COMPLETE  Result Date: 11/14/2020    ECHOCARDIOGRAM REPORT   Patient Name:   Beverly Li Date of Exam: 11/14/2020 Medical Rec #:  [id number]       Height:       63.0 in Accession #:    [id number]      Weight:       113.3 lb Date of Birth:  [birth year]       BSA:          1.519 m Patient Age:    75 years        BP:           121/72 mmHg Patient Gender: F               HR:           61 bpm. Exam Location:  Inpatient Procedure: 2D Echo, Cardiac Doppler, Color Doppler and Strain Analysis Indications:    Stroke  History:        Patient has prior history of Echocardiogram examinations, most                 recent 12/29/2018. CAD; Risk Factors:Hypertension, Dyslipidemia                 and Former Smoker. PAD. Hypertrophic cardiomyopathy.  Sonographer:    Ross Ludwig RDCS (AE) Referring Phys: [id number] [facility]  Sonographer Comments: Global longitudinal strain was attempted. IMPRESSIONS  1. There is no evidence  of systolic anterior motion of the mitral valve or true LV outflow obstruction on this study. LVOT velocities are mildly elevated at 2 m/s. Left ventricular ejection fraction, by estimation, is 60 to 65%. The left ventricle has low normal function. The left ventricle has no regional wall motion abnormalities. There is moderate concentric left ventricular hypertrophy. Left ventricular diastolic parameters are consistent with Grade II diastolic dysfunction (pseudonormalization). Elevated left atrial pressure.  2. Right ventricular systolic function is normal. The right ventricular size is normal.  3. Left atrial size was severely dilated.  4. Right atrial size was mildly  dilated.  5. The mitral valve is degenerative. Mild to moderate mitral valve regurgitation.  6. The aortic valve is tricuspid. There is moderate calcification of the aortic valve. There is mild thickening of the aortic valve. Aortic valve regurgitation is mild. Mild to moderate aortic valve sclerosis/calcification is present, without any evidence of aortic stenosis.  7. The inferior vena cava is dilated in size with <50% respiratory variability, suggesting right atrial pressure of 15 mmHg. Comparison(s): Prior images reviewed side by side. The left ventricular diastolic function is significantly worse. Elevated right and left atrial pressures on the current study. FINDINGS  Left Ventricle: There is no evidence of systolic anterior motion of the mitral valve or true LV outflow obstruction on this study. LVOT velocities are mildly elevated at 2 m/s. Left ventricular ejection fraction, by estimation, is 60 to 65%. The left ventricle has low normal function. The left ventricle has no regional wall motion abnormalities. The left ventricular internal cavity size was normal in size. There is moderate concentric left ventricular hypertrophy. Left ventricular diastolic parameters are consistent with Grade II diastolic dysfunction (pseudonormalization). Elevated left atrial pressure. Right Ventricle: The right ventricular size is normal. No increase in right ventricular wall thickness. Right ventricular systolic function is normal. Left Atrium: Left atrial size was severely dilated. Right Atrium: Right atrial size was mildly dilated. Pericardium: There is no evidence of pericardial effusion. Mitral Valve: The mitral valve is degenerative in appearance. There is mild thickening of the mitral valve leaflet(s). Mild to moderate mitral annular calcification. Mild to moderate mitral valve regurgitation, with centrally-directed jet. MV peak gradient, 12.1 mmHg. The mean mitral valve gradient is 4.0 mmHg. Tricuspid Valve: The  tricuspid valve is normal in structure. Tricuspid valve regurgitation is not demonstrated. Aortic Valve: The aortic valve is tricuspid. There is moderate calcification of the aortic valve. There is mild thickening of the aortic valve. Aortic valve regurgitation is mild. Mild to moderate aortic valve sclerosis/calcification is present, without any evidence of aortic stenosis. Aortic valve mean gradient measures 7.0 mmHg. Aortic valve peak gradient measures 14.3 mmHg. Aortic valve area, by VTI measures 3.95 cm. Pulmonic Valve: The pulmonic valve was normal in structure. Pulmonic valve regurgitation is mild. Aorta: The aortic root and ascending aorta are structurally normal, with no evidence of dilitation. Venous: The inferior vena cava is dilated in size with less than 50% respiratory variability, suggesting right atrial pressure of 15 mmHg. IAS/Shunts: No atrial level shunt detected by color flow Doppler.  LEFT VENTRICLE PLAX 2D LVIDd:         3.55 cm  Diastology LVIDs:         2.30 cm  LV e' medial:    3.92 cm/s LV PW:         1.42 cm  LV E/e' medial:  42.1 LV IVS:        1.34 cm  LV e' lateral:  5.87 cm/s LVOT diam:     2.14 cm  LV E/e' lateral: 28.1 LV SV:         144 LV SV Index:   95       2D Longitudinal Strain LVOT Area:     3.60 cm 2D Strain GLS Avg:     -16.7 %  RIGHT VENTRICLE             IVC RV Basal diam:  3.10 cm     IVC diam: 1.50 cm RV S prime:     14.40 cm/s TAPSE (M-mode): 2.3 cm LEFT ATRIUM              Index       RIGHT ATRIUM           Index LA diam:        4.90 cm  3.23 cm/m  RA Area:     19.70 cm LA Vol (A2C):   71.9 ml  47.34 ml/m RA Volume:   49.70 ml  32.72 ml/m LA Vol (A4C):   120.0 ml 79.01 ml/m LA Biplane Vol: 92.7 ml  61.03 ml/m  AORTIC VALVE AV Area (Vmax):    3.86 cm AV Area (Vmean):   3.95 cm AV Area (VTI):     3.95 cm AV Vmax:           189.00 cm/s AV Vmean:          124.000 cm/s AV VTI:            0.365 m AV Peak Grad:      14.3 mmHg AV Mean Grad:      7.0 mmHg LVOT Vmax:          203.00 cm/s LVOT Vmean:        136.000 cm/s LVOT VTI:          0.401 m LVOT/AV VTI ratio: 1.10  AORTA Ao Root diam: 3.40 cm MITRAL VALVE MV Area (PHT): 2.24 cm     SHUNTS MV Peak grad:  12.1 mmHg    Systemic VTI:  0.40 m MV Mean grad:  4.0 mmHg     Systemic Diam: 2.14 cm MV Vmax:       1.74 m/s MV Vmean:      90.1 cm/s MV Decel Time: 338 msec MV E velocity: 165.00 cm/s MV A velocity: 114.00 cm/s MV E/A ratio:  1.45 Mihai Croitoru MD Electronically signed by Thurmon Fair MD Signature Date/Time: 11/14/2020/12:03:41 PM    Final    IR PERCUTANEOUS ART THROMBECTOMY/INFUSION INTRACRANIAL INC DIAG ANGIO  Result Date: 11/15/2020 INDICATION: New onset left gaze deviation, right-sided hemiplegia, and global aphasia. 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 EXAM: 1. EMERGENT LARGE VESSEL OCCLUSION THROMBOLYSIS (anterior CIRCULATION) COMPARISON:  CT angiogram of the head and neck of November 13, 2020. MEDICATIONS: Ancef 2 g IV antibiotic was administered within 1 hour of the procedure. ANESTHESIA/SEDATION: General anesthesia. CONTRAST:  Omnipaque 300 approximately 60 mL. FLUOROSCOPY TIME:  Fluoroscopy Time: 27 minutes 54 seconds (628 mGy). COMPLICATIONS: None immediate. TECHNIQUE: Following a full explanation of the procedure along with the potential associated complications, an informed witnessed consent was obtained. The risks of intracranial hemorrhage of 10%, worsening neurological deficit, ventilator dependency,death and inability to revascularize were all reviewed in detail with the patient's husband. The patient was then put under general anesthesia by the Department of Anesthesiology at [facility]. The left groin was prepped and draped in the usual sterile fashion. Thereafter using modified Seldinger  technique, transfemoral access into the left common femoral artery was obtained without difficulty. Over a 0.035 inch guidewire an 8 [place] Pinnacle 10 cm sheath was inserted. Through this, and  also over a 0.035 inch guidewire a a 5.5 [place] 125 cm Berenstein support catheter inside of an 087 balloon guide catheter which had been prepped with 50% contrast and 50% heparinized saline infusion was advanced to the aortic arch region, and selectively positioned in the left common carotid artery just proximal to the left common carotid bifurcation. The support catheter, and the guidewire were removed. Good aspiration obtained from the hub of the balloon guide catheter. Arteriograms were then performed centered extra cranially and intracranially. FINDINGS: The left common carotid arteriogram demonstrates the left external carotid artery and its major branches to be widely patent. The left internal carotid artery at the bulb has mild to moderate stenosis due to calcified arteriosclerotic plaque. No evidence of intraluminal filling defects or of ulcerations was evident. More distally the left internal carotid artery is seen to ascend to the cranial skull base. The petrous, cavernous and the supraclinoid segments are widely patent. The left anterior cerebral artery opacifies into the capillary and venous phases. Prompt opacification via the anterior communicating artery of the right anterior cerebral artery and the right middle cerebral artery is seen into the capillary and venous phases. The left middle cerebral artery demonstrates complete occlusion in the mid M1 segment. PROCEDURE: Over a 0.014 inch standard Synchro micro guidewire with a J-tip configuration, a combination of an 071 32 cm Zoom aspiration catheter with the balloon guide catheter were advanced to the supraclinoid left ICA. The balloon guide was situated at the cervical petrous junction. Using a torque device, access was obtained into the M2 M3 region with the micro guidewire, followed by the microcatheter. The guidewire was removed. Poor aspiration obtained from the hub of the microcatheter. This was then retrieved more proximally. At this time the  Zoom aspiration catheter was advanced into the occluded left middle cerebral artery M1 segment. The micro guidewire and the microcatheter were retrieved more proximally. Using constant aspiration with Penumbra aspiration device at the hub of the Zoom aspiration catheter, and proximal flow arrest in the left internal carotid artery, the Zoom aspiration catheter was advanced more distally into the dominant inferior division branch. There it was maintained for approximately 2 minutes. Thereafter, the Zoom aspiration catheter was gently retrieved and removed. Aspiration was continued with a 20 mL syringe at the hub of the balloon guide catheter. Following flow reversal, a control arteriogram performed through the balloon guide catheter in the proximal left internal carotid artery demonstrated complete revascularization of the left middle cerebral artery into the capillary and venous phases. The distal MCA branches were widely patent without evidence of intraluminal filling defects. The left anterior cerebral artery demonstrated wide patency with no change in the contralateral right anterior circulation via the anterior communicating artery. The balloon guide catheter was then removed. The right common carotid artery was then selected over an 0.035 inch Roadrunner guidewire with a 5 [place] diagnostic catheter. An arteriogram performed demonstrated severe stenosis at the origin of the right external carotid artery though its branches appeared widely patent. An eccentric smooth plaque was seen projecting in the right common carotid artery proximal to the bifurcation due to an anteriorly positioned plaque. Right internal carotid artery at the bulb is patent with delayed ascent of contrast to the cranial skull base consistent with angiographic string sign. CT of the brain demonstrated no  evidence of intracranial hemorrhage or mass effect. The left femoral sheath was removed with hemostasis achieved with manual compression.  Distal pulses remained palpable in both feet unchanged at the end of the procedure. Patient was left intubated due to patient being unresponsive due to the stroke, and to protect the airway. Patient was then transferred to PACU and then neuro ICU for post revascularization management. 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 PLAN: Follow-up in the clinic 4 weeks post discharge. Electronically Signed   By: Julieanne Cotton M.D.   On: 11/14/2020 10:13   CT HEAD CODE STROKE WO CONTRAST  Result Date: 11/13/2020 CLINICAL DATA:  Code stroke. EXAM: CT HEAD WITHOUT CONTRAST TECHNIQUE: Contiguous axial images were obtained from the base of the skull through the vertex without intravenous contrast. COMPARISON:  None. FINDINGS: Brain: Asymmetric hypodensity of the left insula, concerning for acute left MCA territory infarct. No acute hemorrhage, hydrocephalus, extra-axial collection or mass lesion/mass effect. Remote infarcts versus dilated perivascular spaces in bilateral inferior basal ganglia. Mild additional patchy white matter hypoattenuation, most likely related to chronic microvascular ischemic disease. Suspected remote lacunar infarct in the right cerebellum. Vascular: Hyperdense appearance of the distal left M1 MCA. Skull: No acute fracture. Sinuses/Orbits: Clear sinuses.  Unremarkable orbits. Other: No mastoid effusions. ASPECTS [facility] Stroke Program Early CT Score) - Ganglionic level infarction (caudate, lentiform nuclei, internal capsule, insula, M1-M3 cortex): 6 - Supraganglionic infarction (M4-M6 cortex): 3 Total score (0-10 with 10 being normal): 9 IMPRESSION: 1. Mild asymmetric hypodensity of the left insula, concerning for acute left MCA  territory infarct. An MRI could further evaluate. 2. Hyperdense appearance of the distal left M1 MCA, concerning for thrombus. Recommend CTA to further evaluate. 3. No acute hemorrhage. 4. Remote lacunar infarcts versus dilated perivascular spaces in bilateral inferior basal ganglia. Suspected remote lacunar infarct in the right cerebellum. Finding discussed with Dr. Derry Lory via telephone at 1:55 p.m. Electronically Signed   By: Feliberto Harts MD   On: 11/13/2020 14:03   VAS [place] LOWER EXTREMITY ARTERIAL DUPLEX  Result Date: 11/26/2020 LOWER EXTREMITY ARTERIAL DUPLEX STUDY Patient Name:  REMAS SOBEL Bannister  Date of Exam:   11/25/2020 Medical Rec #: [id number]        Accession #:    [id number] Date of Birth: December 02, 1945        Patient Gender: F Patient Age:   35 years Exam Location:  [facility] Procedure:      VAS [place] LOWER EXTREMITY ARTERIAL DUPLEX Referring Phys: Leticia Penna --------------------------------------------------------------------------------  Indications: Cool, mottled right lower extremity and foot. 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 CTA Head and Neck                showed occlusion of the left MCA and an age indeterminate                occlusion of the proximal right internal carotid artery in the                neck with non-opacification through the carotid terminus.  Vascular Interventions: Right femoral to below  knee popliteal artery bypass                         graft and right femoral endarterectomy 02/2015 Current ABI:            N/A Comparison Study: Prior right LEA done 01/2018. No left LEA on file. Performing Technologist: Sherren Kerns RVS  Examination Guidelines: A complete evaluation includes B-mode imaging, spectral Doppler, color Doppler, and power Doppler as needed of all accessible portions of each vessel. Bilateral testing is considered an integral part of a complete  examination. Limited examinations for reoccurring indications may be performed as noted.  +-----------+--------+-----+--------+-----------+---------------+ RIGHT      PSV cm/sRatioStenosisWaveform   Comments        +-----------+--------+-----+--------+-----------+---------------+ CFA Prox   32                                              +-----------+--------+-----+--------+-----------+---------------+ SFA Prox                                   known occlusion +-----------+--------+-----+--------+-----------+---------------+ ATA Prox   66                   multiphasic                +-----------+--------+-----+--------+-----------+---------------+ ATA Mid    49                   multiphasic                +-----------+--------+-----+--------+-----------+---------------+ ATA Distal 45                   multiphasic                +-----------+--------+-----+--------+-----------+---------------+ PTA Prox   45                   multiphasic                +-----------+--------+-----+--------+-----------+---------------+ PTA Mid    44                   multiphasic                +-----------+--------+-----+--------+-----------+---------------+ PTA Distal 53                   multiphasic                +-----------+--------+-----+--------+-----------+---------------+ PERO Prox  89                   multiphasic                +-----------+--------+-----+--------+-----------+---------------+ PERO Mid   36                   monophasic                 +-----------+--------+-----+--------+-----------+---------------+ PERO Distal35                   monophasic                 +-----------+--------+-----+--------+-----------+---------------+  Right Graft #1: +------------------+--------+--------+---------+--------+  PSV cm/sStenosisWaveform Comments +------------------+--------+--------+---------+--------+ Inflow                                               +------------------+--------+--------+---------+--------+ Prox Anastomosis                                    +------------------+--------+--------+---------+--------+ Proximal Graft    75              triphasic         +------------------+--------+--------+---------+--------+ Mid Graft                                           +------------------+--------+--------+---------+--------+ Distal Graft      131             triphasic         +------------------+--------+--------+---------+--------+ Distal Anastomosis82              triphasic         +------------------+--------+--------+---------+--------+ Outflow           40              triphasic         +------------------+--------+--------+---------+--------+   +----------+--------+-----+--------+----------+--------------------------------+ LEFT      PSV cm/sRatioStenosisWaveform  Comments                         +----------+--------+-----+--------+----------+--------------------------------+ CFA Prox  100                  monophasic                                 +----------+--------+-----+--------+----------+--------------------------------+ CFA Mid   35                   monophasic                                 +----------+--------+-----+--------+----------+--------------------------------+ DFA       60                   monophasic                                 +----------+--------+-----+--------+----------+--------------------------------+ SFA Prox                       thump                                      +----------+--------+-----+--------+----------+--------------------------------+ SFA Mid                occluded                                           +----------+--------+-----+--------+----------+--------------------------------+ SFA Distal46  monophasicprofunda artery appears to                                                 reverse flow to feed distal                                               femoral artery                   +----------+--------+-----+--------+----------+--------------------------------+ POP Prox  40                   monophasic                                 +----------+--------+-----+--------+----------+--------------------------------+ POP Distal58                   monophasic                                 +----------+--------+-----+--------+----------+--------------------------------+ ATA Distal38                   monophasic                                 +----------+--------+-----+--------+----------+--------------------------------+ PTA Prox  40                   monophasic                                 +----------+--------+-----+--------+----------+--------------------------------+  Summary: Right: Patent fem-pop bypass graft. There appears to be soft thrombus noted in the common femoral artery/endarterectomy site. Left: There appears to be soft thrombus noted in the common femoral artery. Near occlusion of the proximal femoral artery and occlusion of the mid to distal femoral artery. The profunda artery appears to feed the distal femoral artery.  See table(s) above for measurements and observations. Electronically signed by Coral Else MD on 11/26/2020 at 3:28:13 PM.    Final    CT ANGIO HEAD NECK W WO CM W PERF (CODE STROKE)  Result Date: 11/13/2020 EXAM: CT ANGIOGRAPHY HEAD AND NECK CT PERFUSION BRAIN TECHNIQUE: Multidetector CT imaging of the head and neck was performed using the standard protocol during bolus administration of intravenous contrast. Multiplanar CT image reconstructions and MIPs were obtained to evaluate the vascular anatomy. Carotid stenosis measurements (when applicable) are obtained utilizing NASCET criteria, using the distal internal carotid diameter as the denominator. Multiphase CT imaging of the brain was  performed following IV bolus contrast injection. Subsequent parametric perfusion maps were calculated using RAPID software. CONTRAST:  OMNIPAQUE IOHEXOL 350 MG/ML SOLN COMPARISON:  None. FINDINGS: CTA HEAD FINDINGS Anterior circulation: Left ICA is patent with mild to moderate calcific atherosclerosis. Right ICA is non-opacified to the carotid terminus. The right MCA and right posterior communicating artery are opacified, likely the of the right A1 ACA. There is abrupt occlusion of the left M1 MCA with very little opacification of distal MCA branches. Left A1  and A2 ACAs are patent. Loss of opacification of the distal left ACA (A3/A4) on series 7, images 299 through 302, concerning for high-grade stenosis or occlusion. Posterior circulation: Moderate stenosis of the proximal intradural left vertebral artery due to predominately calcific atherosclerosis. Non dominant right intradural vertebral artery. The basilar artery bilateral posterior cerebral arteries are patent without evidence of proximal flow limiting stenosis. Prominent right posterior communicating artery with hypoplastic or absent right A1 ACA, anatomic variant. Venous sinuses: As permitted by contrast timing, patent. Anatomic variants: As detailed above. CTA NECK FINDINGS Aortic arch: Atherosclerosis.  Great vessel origins are patent. Right carotid system: Patent common carotid artery. There is mixed calcific and noncalcific atherosclerosis at the carotid bifurcation with occlusion of the proximal internal carotid artery. The remainder of the ICA is non-opacified. Left carotid system: Patent common carotid artery. Calcific and noncalcific atherosclerosis at the carotid bifurcation with approximately 40-50% stenosis Vertebral arteries: Left dominant. Severe stenosis of the non dominant right vertebral artery origins. Skeleton: Severe degenerative disease at C5-C6. Other neck: No acute findings.  Severe emphysema. Upper chest: Severe emphysema.  Review of the MIP images confirms the above findings CT Brain Perfusion Findings: ASPECTS: 9 CBF (<30%) Volume: 41mL Perfusion (Tmax>6.0s) volume: Mismatch Volume: Infarction Location:Anterior left MCA territory IMPRESSION: 1. Occlusion of the left M1 MCA with very little opacification of distal left MCA branches. Perfusion demonstrates the core infarct of 41 mL in the anterior left MCA territory with large (122 ml) of ischemic phenomena in the remaining left MCA and potentially left ACA territories. 2. Age indeterminate occlusion of the proximal right internal carotid artery in the neck with non-opacification through the carotid terminus. The right MCA and right posterior communicating artery are opacified via the of the right A1 ACA. 3. Suspected high-grade stenosis versus occlusion of the distal left A3/A4 ACA. 4. Approximately 40-50% stenosis of the proximal left ICA in the neck. 5. Severe stenosis of the non dominant/small right vertebral artery origin. Moderate stenosis of the intradural left vertebral artery. 6. Severe emphysema. Critical findings discussed with Dr. Derry Lory at 1:57 p.m. Electronically Signed   By: Feliberto Harts MD   On: 11/13/2020 14:30   IR ANGIO INTRA EXTRACRAN SEL COM CAROTID INNOMINATE UNI L MOD SED  INDICATION: New onset left gaze deviation, right-sided hemiplegia, and global aphasia. 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   EXAM: 1. EMERGENT LARGE VESSEL OCCLUSION THROMBOLYSIS (anterior CIRCULATION)   COMPARISON:  CT angiogram of the head and neck of November 13, 2020.   MEDICATIONS: Ancef 2 g IV antibiotic was administered within 1 hour of the procedure.   ANESTHESIA/SEDATION: General anesthesia.   CONTRAST:  Omnipaque 300 approximately 60 mL.   FLUOROSCOPY TIME:  Fluoroscopy Time: 27 minutes 54 seconds (628 mGy).   COMPLICATIONS: None immediate.   TECHNIQUE: Following a full explanation of the procedure along with the potential associated complications,  an informed witnessed consent was obtained. The risks of intracranial hemorrhage of 10%, worsening neurological deficit, ventilator dependency,death and inability to revascularize were all reviewed in detail with the patient's husband.   The patient was then put under general anesthesia by the Department of Anesthesiology at [facility]   The left groin was prepped and draped in the usual sterile fashion. Thereafter using modified Seldinger technique, transfemoral access into the left common femoral artery was obtained without difficulty. Over a 0.035 inch guidewire an 8 [place] Pinnacle 10 cm sheath was inserted. Through this, and also over a 0.035  inch guidewire a a 5.5 [place] 125 cm Berenstein support catheter inside of an 087 balloon guide catheter which had been prepped with 50% contrast and 50% heparinized saline infusion was advanced to the aortic arch region, and selectively positioned in the left common carotid artery just proximal to the left common carotid bifurcation. The support catheter, and the guidewire were removed. Good aspiration obtained from the hub of the balloon guide catheter. Arteriograms were then performed centered extra cranially and intracranially.   FINDINGS: The left common carotid arteriogram demonstrates the left external carotid artery and its major branches to be widely patent.   The left internal carotid artery at the bulb has mild to moderate stenosis due to calcified arteriosclerotic plaque. No evidence of intraluminal filling defects or of ulcerations was evident.   More distally the left internal carotid artery is seen to ascend to the cranial skull base. The petrous, cavernous and the supraclinoid segments are widely patent. The left anterior cerebral artery opacifies into the capillary and venous phases.   Prompt opacification via the anterior communicating artery of the right anterior cerebral artery and the right middle cerebral artery is seen into the capillary  and venous phases.   The left middle cerebral artery demonstrates complete occlusion in the mid M1 segment.   PROCEDURE: Over a 0.014 inch standard Synchro micro guidewire with a J-tip configuration, a combination of an 071 32 cm Zoom aspiration catheter with the balloon guide catheter were advanced to the supraclinoid left ICA. The balloon guide was situated at the cervical petrous junction.   Using a torque device, access was obtained into the M2 M3 region with the micro guidewire, followed by the microcatheter. The guidewire was removed. Poor aspiration obtained from the hub of the microcatheter. This was then retrieved more proximally. 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 The micro guidewire and the microcatheter were retrieved more proximally.   Using constant aspiration with Penumbra aspiration device at the hub of the Zoom aspiration catheter, and proximal flow arrest in the left internal carotid artery, the Zoom aspiration catheter was advanced more distally into the dominant inferior division branch. There it was maintained for approximately 2 minutes. Thereafter, the Zoom aspiration catheter was gently retrieved and removed. Aspiration was continued with a 20 mL syringe at the hub of the balloon guide catheter. Following flow reversal, a control arteriogram performed through the balloon guide catheter in the proximal left internal carotid artery demonstrated complete revascularization of the left middle cerebral artery into the capillary and venous phases. The distal MCA branches were widely patent without evidence of intraluminal filling defects.   The left anterior cerebral artery demonstrated wide patency with no change in the contralateral right anterior circulation via the anterior communicating artery.   The balloon guide catheter was then removed. The right common carotid artery was then selected over an 0.035 inch  Roadrunner guidewire with a 5 [place] diagnostic catheter. An arteriogram performed demonstrated severe stenosis at the origin of the right external carotid artery though its branches appeared widely patent.   An eccentric smooth plaque was seen projecting in the right common carotid artery proximal to the bifurcation due to an anteriorly positioned plaque.   Right internal carotid artery at the bulb is patent with delayed ascent of contrast to the cranial skull base consistent with angiographic string sign.   CT of the brain demonstrated no evidence of intracranial hemorrhage or mass effect.  The left femoral sheath was removed with hemostasis achieved with manual compression. Distal pulses remained palpable in both feet unchanged at the end of the procedure.   Patient was left intubated due to patient being unresponsive due to the stroke, and to protect the airway.   Patient was then transferred to PACU and then neuro ICU for post revascularization management.   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   PLAN: Follow-up in the clinic 4 weeks post discharge.     Electronically Signed   By: Julieanne Cotton M.D.   On: 11/14/2020 10:13   Scans on Order [id number]      Subjective: Non-verbal.  No fever, no respiratory distress, no vomiting, no agitation.  Discharge Exam: Vitals:   12/06/20 1008 12/06/20 1009  BP: (!) 157/112 (!) 159/88  Pulse: 92 84  Resp: 18   Temp: 97.6 F (36.4 C)   SpO2: 97%    Vitals:   12/06/20 0003 12/06/20 0423 12/06/20 1008 12/06/20 1009  BP: (!) 148/80 (!) 143/96 (!) 157/112 (!) 159/88  Pulse: 86 92 92 84  Resp: 18 20 18    Temp: (!) 97.5 F (36.4 C) 98.9 F (37.2 C) 97.6 F (36.4 C)   TempSrc: Oral  Oral Oral   SpO2: 93% 98% 97%   Weight:  53 kg    Height:        General: Pt is awake, makes eye contact.  Lying in bed.    Cardiovascular: Irregular, heart rate controlled, nl S1-S2, no murmurs appreciated.   No LE edema.   Respiratory: Respirations seem shallow but no rales or wheezing noted. Abdominal: Abdomen soft and non-tender.  No distension or HSM.   Neuro/Psych: Right hemiparesis.  Densely aphasic.  Right facial droop.          The results of significant diagnostics from this hospitalization (including imaging, microbiology, ancillary and laboratory) are listed below for reference.     Microbiology: Recent Results (from the past 240 hour(s))  SARS CORONAVIRUS 2 (TAT 6-24 HRS) Nasopharyngeal Nasopharyngeal Swab     Status: None   Collection Time: 12/05/20  4:58 PM   Specimen: Nasopharyngeal Swab  Result Value Ref Range Status   SARS Coronavirus 2 NEGATIVE NEGATIVE Final    Comment: (NOTE) SARS-CoV-2 target nucleic acids are NOT DETECTED.  The SARS-CoV-2 RNA is generally detectable in upper and lower respiratory specimens during the acute phase of infection. Negative results do not preclude SARS-CoV-2 infection, do not rule out co-infections with other pathogens, and should not be used as the sole basis for treatment or other patient management decisions. Negative results must be combined with clinical observations, patient history, and epidemiological information. The expected result is Negative.  Fact Sheet for Patients: HairSlick.no  Fact Sheet for Healthcare Providers: quierodirigir.com  This test is not yet approved or cleared by the [place] FDA and  has been authorized for detection and/or diagnosis of SARS-CoV-2 by FDA under an Emergency Use Authorization (EUA). This EUA will remain  in effect (meaning this test can be used) for the duration of the COVID-19 declaration under Se ction 564(b)(1) of  the Act, 21 U.S.C. section 360bbb-3(b)(1), unless the authorization is terminated or revoked sooner.  Performed at [facility], [address],  [place], [place] [address]      Labs: BNP (last 3 results) Recent Labs    11/21/20 0316 11/23/20 0412  BNP 1,286.5* 898.1*   Basic Metabolic Panel: Recent Labs  Lab 11/30/20 0826 12/01/20 0516 12/02/20 0216 12/04/20 0052 12/05/20 0310  NA 149* 149* 140 137 136  K 3.8 3.8 4.2 4.5 4.5  CL 113* 109 108 104 102  CO2 32 33* 25 27 29   GLUCOSE 110* 93 181* 118* 121*  BUN 57* 49* 51* 43* 36*  CREATININE 0.80 0.77 0.79 0.67 0.63  CALCIUM 8.5* 8.6* 8.4* 8.4* 8.6*  MG  --   --  2.2  --   --   PHOS  --   --  3.0  --   --    Liver Function Tests: Recent Labs  Lab 12/02/20 0216  ALBUMIN 2.4*   No results for input(s): LIPASE, AMYLASE in the last 168 hours. No results for input(s): AMMONIA in the last 168 hours. CBC: Recent Labs  Lab 12/02/20 0216 12/03/20 0215 12/04/20 0052 12/05/20 0310 12/06/20 0400  WBC 19.0* 18.3* 13.2* 11.5* 11.0*  HGB 12.2 12.2 11.9* 12.3 12.1  HCT 38.9 38.2 38.0 38.8 38.6  MCV 99.5 99.2 100.3* 98.7 98.7  PLT 271 267 262 253 249   Cardiac Enzymes: No results for input(s): CKTOTAL, CKMB, CKMBINDEX, TROPONINI in the last 168 hours. BNP: Invalid input(s): POCBNP CBG: Recent Labs  Lab 12/05/20 1721 12/05/20 2017 12/06/20 0001 12/06/20 0422 12/06/20 0733  GLUCAP 150* 133* 129* 141* 111*   D-Dimer No results for input(s): DDIMER in the last 72 hours. Hgb A1c No results for input(s): HGBA1C in the last 72 hours. Lipid Profile No results for input(s): CHOL, HDL, LDLCALC, TRIG, CHOLHDL, LDLDIRECT in the last 72 hours. Thyroid function studies No results for input(s): TSH, T4TOTAL, T3FREE, THYROIDAB in the last 72 hours.  Invalid input(s): FREET3 Anemia work up No results for input(s): VITAMINB12, FOLATE, FERRITIN, TIBC, IRON, RETICCTPCT in the last 72 hours. Urinalysis    Component  Value Date/Time   COLORURINE YELLOW 11/13/2020 1728   APPEARANCEUR CLEAR 11/13/2020 1728   LABSPEC 1.045 (H) 11/13/2020 1728   PHURINE 7.0 11/13/2020 1728   GLUCOSEU NEGATIVE 11/13/2020 1728   HGBUR SMALL (A) 11/13/2020 1728   BILIRUBINUR NEGATIVE 11/13/2020 1728   KETONESUR 5 (A) 11/13/2020 1728   PROTEINUR NEGATIVE 11/13/2020 1728   UROBILINOGEN 1.0 02/27/2015 1127   NITRITE NEGATIVE 11/13/2020 1728   LEUKOCYTESUR NEGATIVE 11/13/2020 1728   Sepsis Labs Invalid input(s): PROCALCITONIN,  WBC,  LACTICIDVEN Microbiology Recent Results (from the past 240 hour(s))  SARS CORONAVIRUS 2 (TAT 6-24 HRS) Nasopharyngeal Nasopharyngeal Swab     Status: None   Collection Time: 12/05/20  4:58 PM   Specimen: Nasopharyngeal Swab  Result Value Ref Range Status   SARS Coronavirus 2 NEGATIVE NEGATIVE Final    Comment: (NOTE) SARS-CoV-2 target nucleic acids are NOT DETECTED.  The SARS-CoV-2 RNA is generally detectable in upper and lower respiratory specimens during the acute phase of infection. Negative results do not preclude SARS-CoV-2 infection, do not rule out co-infections with other pathogens, and should not be used as the sole basis for treatment or other patient management decisions. Negative results must be combined with clinical observations, patient history, and epidemiological information. The expected result is Negative.  Fact Sheet for Patients: HairSlick.no  Fact Sheet for Healthcare Providers: quierodirigir.com  This test is not yet approved or cleared by the [place] FDA and  has been authorized for detection  and/or diagnosis of SARS-CoV-2 by FDA under an Emergency Use Authorization (EUA). This EUA will remain  in effect (meaning this test can be used) for the duration of the COVID-19 declaration under Se ction 564(b)(1) of the Act, 21 U.S.C. section 360bbb-3(b)(1), unless the authorization is terminated or revoked  sooner.  Performed at [facility], [address], [place], [place] [address]      Time coordinating discharge: 35 minutes    30 Day Unplanned Readmission Risk Score    Flowsheet Row ED to Hosp-Admission (Current) from 11/13/2020 in [facility]  30 Day Unplanned Readmission Risk Score (%) 21.09 Filed at 12/06/2020 0400       This score is the patient's risk of an unplanned readmission within 30 days of being discharged (0 -100%). The score is based on dignosis, age, lab data, medications, orders, and past utilization.   Low:  0-14.9   Medium: 15-21.9   High: 22-29.9   Extreme: 30 and above            SIGNED:   Alberteen Sam, MD  [facility] 12/06/2020, 10:20 AM

## 2020-12-12 ENCOUNTER — Emergency Department (HOSPITAL_COMMUNITY): Payer: Medicare Other

## 2020-12-12 ENCOUNTER — Other Ambulatory Visit: Payer: Self-pay

## 2020-12-12 ENCOUNTER — Encounter (HOSPITAL_COMMUNITY): Payer: Self-pay

## 2020-12-12 ENCOUNTER — Inpatient Hospital Stay (HOSPITAL_COMMUNITY)
Admission: EM | Admit: 2020-12-12 | Discharge: 2020-12-21 | DRG: 871 | Disposition: A | Discharge status: Rehabilitation Facility | Payer: Medicare Other | Attending: Internal Medicine | Admitting: Internal Medicine

## 2020-12-12 DIAGNOSIS — R6889 Other general symptoms and signs: Secondary | ICD-10-CM | POA: Diagnosis not present

## 2020-12-12 DIAGNOSIS — R0689 Other abnormalities of breathing: Secondary | ICD-10-CM | POA: Diagnosis not present

## 2020-12-12 DIAGNOSIS — R4701 Aphasia: Secondary | ICD-10-CM | POA: Diagnosis not present

## 2020-12-12 DIAGNOSIS — R0600 Dyspnea, unspecified: Secondary | ICD-10-CM | POA: Diagnosis not present

## 2020-12-12 DIAGNOSIS — M8580 Other specified disorders of bone density and structure, unspecified site: Secondary | ICD-10-CM | POA: Diagnosis not present

## 2020-12-12 DIAGNOSIS — I69391 Dysphagia following cerebral infarction: Secondary | ICD-10-CM

## 2020-12-12 DIAGNOSIS — Z880 Allergy status to penicillin: Secondary | ICD-10-CM

## 2020-12-12 DIAGNOSIS — F419 Anxiety disorder, unspecified: Secondary | ICD-10-CM | POA: Diagnosis present

## 2020-12-12 DIAGNOSIS — Z800 Family history of malignant neoplasm of digestive organs: Secondary | ICD-10-CM

## 2020-12-12 DIAGNOSIS — J441 Chronic obstructive pulmonary disease with (acute) exacerbation: Secondary | ICD-10-CM

## 2020-12-12 DIAGNOSIS — Z931 Gastrostomy status: Secondary | ICD-10-CM

## 2020-12-12 DIAGNOSIS — Z743 Need for continuous supervision: Secondary | ICD-10-CM | POA: Diagnosis not present

## 2020-12-12 DIAGNOSIS — K219 Gastro-esophageal reflux disease without esophagitis: Secondary | ICD-10-CM | POA: Diagnosis present

## 2020-12-12 DIAGNOSIS — Z882 Allergy status to sulfonamides status: Secondary | ICD-10-CM

## 2020-12-12 DIAGNOSIS — E785 Hyperlipidemia, unspecified: Secondary | ICD-10-CM | POA: Diagnosis present

## 2020-12-12 DIAGNOSIS — Z803 Family history of malignant neoplasm of breast: Secondary | ICD-10-CM

## 2020-12-12 DIAGNOSIS — Z7951 Long term (current) use of inhaled steroids: Secondary | ICD-10-CM | POA: Diagnosis not present

## 2020-12-12 DIAGNOSIS — Z79899 Other long term (current) drug therapy: Secondary | ICD-10-CM

## 2020-12-12 DIAGNOSIS — I69351 Hemiplegia and hemiparesis following cerebral infarction affecting right dominant side: Secondary | ICD-10-CM

## 2020-12-12 DIAGNOSIS — Z888 Allergy status to other drugs, medicaments and biological substances status: Secondary | ICD-10-CM

## 2020-12-12 DIAGNOSIS — Z881 Allergy status to other antibiotic agents status: Secondary | ICD-10-CM

## 2020-12-12 DIAGNOSIS — J189 Pneumonia, unspecified organism: Secondary | ICD-10-CM

## 2020-12-12 DIAGNOSIS — A419 Sepsis, unspecified organism: Secondary | ICD-10-CM | POA: Diagnosis not present

## 2020-12-12 DIAGNOSIS — L89159 Pressure ulcer of sacral region, unspecified stage: Secondary | ICD-10-CM | POA: Diagnosis not present

## 2020-12-12 DIAGNOSIS — I739 Peripheral vascular disease, unspecified: Secondary | ICD-10-CM | POA: Diagnosis not present

## 2020-12-12 DIAGNOSIS — J690 Pneumonitis due to inhalation of food and vomit: Secondary | ICD-10-CM | POA: Diagnosis not present

## 2020-12-12 DIAGNOSIS — I4891 Unspecified atrial fibrillation: Secondary | ICD-10-CM | POA: Diagnosis not present

## 2020-12-12 DIAGNOSIS — I63512 Cerebral infarction due to unspecified occlusion or stenosis of left middle cerebral artery: Secondary | ICD-10-CM | POA: Diagnosis not present

## 2020-12-12 DIAGNOSIS — J449 Chronic obstructive pulmonary disease, unspecified: Secondary | ICD-10-CM | POA: Diagnosis present

## 2020-12-12 DIAGNOSIS — Z7901 Long term (current) use of anticoagulants: Secondary | ICD-10-CM

## 2020-12-12 DIAGNOSIS — Z87891 Personal history of nicotine dependence: Secondary | ICD-10-CM | POA: Diagnosis not present

## 2020-12-12 DIAGNOSIS — Z801 Family history of malignant neoplasm of trachea, bronchus and lung: Secondary | ICD-10-CM

## 2020-12-12 DIAGNOSIS — I421 Obstructive hypertrophic cardiomyopathy: Secondary | ICD-10-CM | POA: Diagnosis present

## 2020-12-12 DIAGNOSIS — Z681 Body mass index (BMI) 19 or less, adult: Secondary | ICD-10-CM | POA: Diagnosis not present

## 2020-12-12 DIAGNOSIS — J90 Pleural effusion, not elsewhere classified: Secondary | ICD-10-CM | POA: Diagnosis not present

## 2020-12-12 DIAGNOSIS — I10 Essential (primary) hypertension: Secondary | ICD-10-CM | POA: Diagnosis present

## 2020-12-12 DIAGNOSIS — E43 Unspecified severe protein-calorie malnutrition: Secondary | ICD-10-CM | POA: Diagnosis present

## 2020-12-12 DIAGNOSIS — Z20822 Contact with and (suspected) exposure to covid-19: Secondary | ICD-10-CM | POA: Diagnosis not present

## 2020-12-12 DIAGNOSIS — J439 Emphysema, unspecified: Secondary | ICD-10-CM | POA: Diagnosis not present

## 2020-12-12 DIAGNOSIS — R799 Abnormal finding of blood chemistry, unspecified: Secondary | ICD-10-CM | POA: Diagnosis not present

## 2020-12-12 DIAGNOSIS — I422 Other hypertrophic cardiomyopathy: Secondary | ICD-10-CM | POA: Diagnosis not present

## 2020-12-12 DIAGNOSIS — I499 Cardiac arrhythmia, unspecified: Secondary | ICD-10-CM | POA: Diagnosis not present

## 2020-12-12 DIAGNOSIS — Z8249 Family history of ischemic heart disease and other diseases of the circulatory system: Secondary | ICD-10-CM

## 2020-12-12 DIAGNOSIS — I110 Hypertensive heart disease with heart failure: Secondary | ICD-10-CM | POA: Diagnosis not present

## 2020-12-12 DIAGNOSIS — I480 Paroxysmal atrial fibrillation: Secondary | ICD-10-CM | POA: Diagnosis present

## 2020-12-12 DIAGNOSIS — J9601 Acute respiratory failure with hypoxia: Secondary | ICD-10-CM | POA: Diagnosis present

## 2020-12-12 DIAGNOSIS — I5033 Acute on chronic diastolic (congestive) heart failure: Secondary | ICD-10-CM | POA: Diagnosis not present

## 2020-12-12 DIAGNOSIS — R404 Transient alteration of awareness: Secondary | ICD-10-CM | POA: Diagnosis not present

## 2020-12-12 DIAGNOSIS — R1310 Dysphagia, unspecified: Secondary | ICD-10-CM | POA: Diagnosis not present

## 2020-12-12 DIAGNOSIS — Z794 Long term (current) use of insulin: Secondary | ICD-10-CM

## 2020-12-12 DIAGNOSIS — I69320 Aphasia following cerebral infarction: Secondary | ICD-10-CM | POA: Diagnosis not present

## 2020-12-12 DIAGNOSIS — I700 Atherosclerosis of aorta: Secondary | ICD-10-CM | POA: Diagnosis present

## 2020-12-12 DIAGNOSIS — K5900 Constipation, unspecified: Secondary | ICD-10-CM | POA: Diagnosis not present

## 2020-12-12 DIAGNOSIS — R5381 Other malaise: Secondary | ICD-10-CM | POA: Diagnosis not present

## 2020-12-12 DIAGNOSIS — I639 Cerebral infarction, unspecified: Secondary | ICD-10-CM | POA: Diagnosis not present

## 2020-12-12 DIAGNOSIS — I482 Chronic atrial fibrillation, unspecified: Secondary | ICD-10-CM | POA: Diagnosis not present

## 2020-12-12 DIAGNOSIS — Z885 Allergy status to narcotic agent status: Secondary | ICD-10-CM

## 2020-12-12 LAB — URINALYSIS, ROUTINE W REFLEX MICROSCOPIC
Bilirubin Urine: NEGATIVE
Glucose, UA: NEGATIVE mg/dL
Hgb urine dipstick: NEGATIVE
Ketones, ur: NEGATIVE mg/dL
Nitrite: NEGATIVE
Protein, ur: 100 mg/dL — AB
Specific Gravity, Urine: 1.02 (ref 1.005–1.030)
pH: 6 (ref 5.0–8.0)

## 2020-12-12 LAB — PROCALCITONIN: Procalcitonin: 0.38 ng/mL

## 2020-12-12 LAB — CBC WITH DIFFERENTIAL/PLATELET
Abs Immature Granulocytes: 0.12 10*3/uL — ABNORMAL HIGH (ref 0.00–0.07)
Basophils Absolute: 0 10*3/uL (ref 0.0–0.1)
Basophils Relative: 0 %
Eosinophils Absolute: 0 10*3/uL (ref 0.0–0.5)
Eosinophils Relative: 0 %
HCT: 45.8 % (ref 36.0–46.0)
Hemoglobin: 14.5 g/dL (ref 12.0–15.0)
Immature Granulocytes: 1 %
Lymphocytes Relative: 4 %
Lymphs Abs: 0.8 10*3/uL (ref 0.7–4.0)
MCH: 31.7 pg (ref 26.0–34.0)
MCHC: 31.7 g/dL (ref 30.0–36.0)
MCV: 100 fL (ref 80.0–100.0)
Monocytes Absolute: 0.9 10*3/uL (ref 0.1–1.0)
Monocytes Relative: 5 %
Neutro Abs: 17.2 10*3/uL — ABNORMAL HIGH (ref 1.7–7.7)
Neutrophils Relative %: 90 %
Platelets: 227 10*3/uL (ref 150–400)
RBC: 4.58 MIL/uL (ref 3.87–5.11)
RDW: 18.4 % — ABNORMAL HIGH (ref 11.5–15.5)
WBC: 19 10*3/uL — ABNORMAL HIGH (ref 4.0–10.5)
nRBC: 0 % (ref 0.0–0.2)

## 2020-12-12 LAB — COMPREHENSIVE METABOLIC PANEL
ALT: 48 U/L — ABNORMAL HIGH (ref 0–44)
AST: 35 U/L (ref 15–41)
Albumin: 2.9 g/dL — ABNORMAL LOW (ref 3.5–5.0)
Alkaline Phosphatase: 83 U/L (ref 38–126)
Anion gap: 8 (ref 5–15)
BUN: 34 mg/dL — ABNORMAL HIGH (ref 8–23)
CO2: 29 mmol/L (ref 22–32)
Calcium: 8.9 mg/dL (ref 8.9–10.3)
Chloride: 101 mmol/L (ref 98–111)
Creatinine, Ser: 0.68 mg/dL (ref 0.44–1.00)
GFR, Estimated: >60 mL/min (ref >60)
Glucose, Bld: 117 mg/dL — ABNORMAL HIGH (ref 70–99)
Potassium: 4.6 mmol/L (ref 3.5–5.1)
Sodium: 138 mmol/L (ref 135–145)
Total Bilirubin: 1.3 mg/dL — ABNORMAL HIGH (ref 0.3–1.2)
Total Protein: 6.5 g/dL (ref 6.5–8.1)

## 2020-12-12 LAB — APTT: aPTT: 34 seconds (ref 24–36)

## 2020-12-12 LAB — GLUCOSE, CAPILLARY: Glucose-Capillary: 128 mg/dL — ABNORMAL HIGH (ref 70–99)

## 2020-12-12 LAB — LACTIC ACID, PLASMA
Lactic Acid, Venous: 1.1 mmol/L (ref 0.5–1.9)
Lactic Acid, Venous: 1 mmol/L (ref 0.5–1.9)

## 2020-12-12 LAB — PROTIME-INR
INR: 1.7 — ABNORMAL HIGH (ref 0.8–1.2)
Prothrombin Time: 19.9 seconds — ABNORMAL HIGH (ref 11.4–15.2)

## 2020-12-12 LAB — RESP PANEL BY RT-PCR (FLU A&B, COVID) ARPGX2
Influenza A by PCR: NEGATIVE
Influenza B by PCR: NEGATIVE
SARS Coronavirus 2 by RT PCR: NEGATIVE

## 2020-12-12 LAB — TROPONIN I (HIGH SENSITIVITY)
Troponin I (High Sensitivity): 173 ng/L (ref <18)
Troponin I (High Sensitivity): 172 ng/L (ref <18)

## 2020-12-12 LAB — STREP PNEUMONIAE URINARY ANTIGEN: Strep Pneumo Urinary Antigen: NEGATIVE

## 2020-12-12 LAB — BRAIN NATRIURETIC PEPTIDE: B Natriuretic Peptide: 970.9 pg/mL — ABNORMAL HIGH (ref 0.0–100.0)

## 2020-12-12 LAB — HIV ANTIBODY (ROUTINE TESTING W REFLEX): HIV Screen 4th Generation wRfx: NONREACTIVE

## 2020-12-12 LAB — CBG MONITORING, ED: Glucose-Capillary: 109 mg/dL — ABNORMAL HIGH (ref 70–99)

## 2020-12-12 MED ORDER — HYDRALAZINE HCL 50 MG PO TABS
50.0000 mg | ORAL_TABLET | Freq: Two times a day (BID) | ORAL | Status: DC
  Administered 2020-12-13 (×2): 50 mg
  Filled 2020-12-12 (×2): qty 1

## 2020-12-12 MED ORDER — PROSOURCE TF PO LIQD
45.0000 mL | Freq: Two times a day (BID) | ORAL | Status: DC
  Administered 2020-12-13 – 2020-12-18 (×12): 45 mL
  Filled 2020-12-12 (×13): qty 45

## 2020-12-12 MED ORDER — SODIUM CHLORIDE 0.9 % IV SOLN
2.0000 g | INTRAVENOUS | Status: DC
  Administered 2020-12-12 – 2020-12-15 (×4): 2 g via INTRAVENOUS
  Filled 2020-12-12 (×5): qty 20

## 2020-12-12 MED ORDER — ACETAMINOPHEN 160 MG/5ML PO SOLN
650.0000 mg | ORAL | Status: DC | PRN
  Filled 2020-12-12: qty 20.3

## 2020-12-12 MED ORDER — FREE WATER
200.0000 mL | Status: DC
  Administered 2020-12-13 – 2020-12-18 (×33): 200 mL

## 2020-12-12 MED ORDER — ACETAMINOPHEN 650 MG RE SUPP
650.0000 mg | Freq: Once | RECTAL | Status: AC
  Administered 2020-12-12: 650 mg via RECTAL
  Filled 2020-12-12: qty 1

## 2020-12-12 MED ORDER — INSULIN ASPART 100 UNIT/ML IJ SOLN
0.0000 [IU] | INTRAMUSCULAR | Status: DC
  Administered 2020-12-13 (×2): 1 [IU] via SUBCUTANEOUS
  Administered 2020-12-13 (×2): 2 [IU] via SUBCUTANEOUS
  Administered 2020-12-13 – 2020-12-15 (×8): 1 [IU] via SUBCUTANEOUS
  Administered 2020-12-16: 2 [IU] via SUBCUTANEOUS
  Administered 2020-12-16 – 2020-12-20 (×14): 1 [IU] via SUBCUTANEOUS
  Administered 2020-12-20 – 2020-12-21 (×3): 2 [IU] via SUBCUTANEOUS

## 2020-12-12 MED ORDER — AMIODARONE HCL 200 MG PO TABS
200.0000 mg | ORAL_TABLET | Freq: Every day | ORAL | Status: DC
  Administered 2020-12-12 – 2020-12-21 (×10): 200 mg
  Filled 2020-12-12 (×10): qty 1

## 2020-12-12 MED ORDER — PANTOPRAZOLE SODIUM 40 MG PO PACK
40.0000 mg | PACK | Freq: Every day | ORAL | Status: DC
  Administered 2020-12-13 – 2020-12-20 (×9): 40 mg
  Filled 2020-12-12 (×10): qty 20

## 2020-12-12 MED ORDER — BUPROPION HCL 75 MG PO TABS
75.0000 mg | ORAL_TABLET | Freq: Two times a day (BID) | ORAL | Status: DC
  Administered 2020-12-13 – 2020-12-21 (×18): 75 mg
  Filled 2020-12-12 (×20): qty 1

## 2020-12-12 MED ORDER — ARFORMOTEROL TARTRATE 15 MCG/2ML IN NEBU
15.0000 ug | INHALATION_SOLUTION | Freq: Two times a day (BID) | RESPIRATORY_TRACT | Status: DC
  Administered 2020-12-12 – 2020-12-21 (×14): 15 ug via RESPIRATORY_TRACT
  Filled 2020-12-12 (×17): qty 2

## 2020-12-12 MED ORDER — FUROSEMIDE 10 MG/ML IJ SOLN
40.0000 mg | Freq: Once | INTRAMUSCULAR | Status: AC
  Administered 2020-12-12: 40 mg via INTRAVENOUS
  Filled 2020-12-12: qty 4

## 2020-12-12 MED ORDER — EZETIMIBE 10 MG PO TABS
10.0000 mg | ORAL_TABLET | Freq: Every day | ORAL | Status: DC
  Administered 2020-12-12 – 2020-12-21 (×10): 10 mg
  Filled 2020-12-12 (×10): qty 1

## 2020-12-12 MED ORDER — METOPROLOL TARTRATE 12.5 MG HALF TABLET
12.5000 mg | ORAL_TABLET | Freq: Two times a day (BID) | ORAL | Status: DC
  Administered 2020-12-12 – 2020-12-21 (×19): 12.5 mg
  Filled 2020-12-12 (×19): qty 1

## 2020-12-12 MED ORDER — REVEFENACIN 175 MCG/3ML IN SOLN
175.0000 ug | Freq: Every day | RESPIRATORY_TRACT | Status: DC
  Administered 2020-12-12 – 2020-12-21 (×8): 175 ug via RESPIRATORY_TRACT
  Filled 2020-12-12 (×10): qty 3

## 2020-12-12 MED ORDER — BUDESONIDE 0.5 MG/2ML IN SUSP
0.5000 mg | Freq: Two times a day (BID) | RESPIRATORY_TRACT | Status: DC
  Administered 2020-12-12 – 2020-12-21 (×14): 0.5 mg via RESPIRATORY_TRACT
  Filled 2020-12-12 (×18): qty 2

## 2020-12-12 MED ORDER — SODIUM CHLORIDE 0.9 % IV SOLN
100.0000 mg | Freq: Two times a day (BID) | INTRAVENOUS | Status: DC
  Administered 2020-12-12 – 2020-12-13 (×2): 100 mg via INTRAVENOUS
  Filled 2020-12-12 (×2): qty 100

## 2020-12-12 MED ORDER — JEVITY 1.2 CAL PO LIQD
55.0000 mL/h | ORAL | Status: DC
  Administered 2020-12-13 – 2020-12-18 (×5): 55 mL/h
  Filled 2020-12-12 (×10): qty 1000

## 2020-12-12 MED ORDER — APIXABAN 5 MG PO TABS
5.0000 mg | ORAL_TABLET | Freq: Two times a day (BID) | ORAL | Status: DC
  Administered 2020-12-12 – 2020-12-21 (×18): 5 mg
  Filled 2020-12-12 (×17): qty 1

## 2020-12-12 MED ORDER — APIXABAN 5 MG PO TABS: 5.0000 mg | ORAL_TABLET | Freq: Two times a day (BID) | ORAL | Status: DC

## 2020-12-12 NOTE — ED Provider Notes (Signed)
[facility] EMERGENCY DEPARTMENT Provider Note   CSN: [id number] Arrival date & time: 12/12/20  1123     History Chief Complaint  Patient presents with   Respiratory Distress    Beverly Li is a 75 y.o. female with a history of prior stroke, atrial fibrillation, hypertension, hypertrophic cardiomyopathy, anxiety, obstructive sleep apnea, and peripheral vascular disease who presents to the emergency department via EMS for evaluation of difficulty breathing today.  Per patient's husband at bedside he was visiting today and felt like she was having trouble breathing, she seemed to be working harder, he alerted staff who increased her oxygen, she has been wearing 1 L of oxygen as needed since leaving the hospital but was having trouble on this.  Patient unable to provide much history with her global aphasia status post stroke which the patient's husband states is at baseline from her most recent discharge from the hospital.  Level 5 caveat applies.  HPI     Past Medical History:  Diagnosis Date   Anxiety    Arthritis    "some in my lower back; probably elbows, knees" (11/18/2017)   Atrial fibrillation (HCC)    Bell's palsy    when pt. was 75 yrs old, when under stress the left side of face will droop.   Complication of anesthesia    "vascular OR 2016; BP bottomed out; couldn't get it regulated; ended up in ICU for DAYS" (11/18/2017)   GERD (gastroesophageal reflux disease)    History of kidney stones    Hypertension    Hypertrophic cardiomyopathy (HCC)    severe LV basilar hypertrophy witn no evidence of significant outflow tract obstruction, EF 65-70%, mild LAE, mild TR, grade 1a diastolic dysfunction 05/15/10 (Dr. 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   Encounter for feeding tube placement 04/27/2020   Sinus bradycardia 12/25/2019   COPD (chronic obstructive pulmonary disease) (HCC) 12/16/2018   Cough 12/16/2018   Unintentional weight loss 10/05/2018   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   Visit for monitoring Tikosyn therapy 11/18/2017   Closed nondisplaced fracture of head of radius with routine healing 03/03/2017   Coronary artery disease involving native coronary artery of native heart without angina pectoris    SOB (shortness of breath) 01/26/2016   Chronic diastolic heart failure (HCC) 03/28/2015   Paroxysmal atrial fibrillation (HCC)    Essential hypertension    S/P femoral-popliteal bypass surgery    Obstructive hypertrophic cardiomyopathy (HCC) 03/03/2015   PAD (peripheral artery disease) (HCC) 03/02/2015   Claudication (HCC) 01/31/2015   Former smoker 01/31/2015   Hyperlipidemia 01/31/2015   Atherosclerosis of native arteries of extremity with intermittent claudication (HCC) 02/10/2014   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   Atrial arrhythmia    Mild aortic sclerosis     Past Surgical History:  Procedure Laterality Date   AUGMENTATION MAMMAPLASTY Bilateral    BACK SURGERY     CARDIAC CATHETERIZATION N/A  05/07/2016   Procedure: Left Heart Cath and Coronary Angiography;  Surgeon: Kathleene Hazelhristopher D McAlhany, MD;  [facility];  Service: Cardiovascular;  Laterality: N/A;   CARDIOVERSION N/A 09/24/2017   Procedure: CARDIOVERSION;  Surgeon: Laurey MoraleMcLean, Dalton S, MD;  [facility];  Service: Cardiovascular;  Laterality: N/A;   DILATION AND CURETTAGE OF UTERUS     ENDARTERECTOMY FEMORAL Right 03/02/2015    Procedure: ENDARTERECTOMY RIGHT FEMORAL;  Surgeon: Chuck Hinthristopher S Dickson, MD;  [facility];  Service: Vascular;  Laterality: Right;   ESOPHAGOGASTRODUODENOSCOPY (EGD) WITH PROPOFOL N/A 12/01/2020   Procedure: ESOPHAGOGASTRODUODENOSCOPY (EGD) WITH PROPOFOL;  Surgeon: Diamantina MonksLovick, Ayesha N, MD;  Location: [facility] ENDOSCOPY;  Service: General;  Laterality: N/A;   FACIAL COSMETIC SURGERY Left 2002   "related to Bell's Palsy @ age 75; left eye/side of face droopy; tried to make area symmetrical"   FEMORAL-POPLITEAL BYPASS GRAFT Right 03/02/2015   Procedure: BYPASS GRAFT FEMORAL-BELOW KNEE POPLITEAL ARTERY;  Surgeon: Chuck Hinthristopher S Dickson, MD;  Location: [facility] OR;  Service: Vascular;  Laterality: Right;   INGUINAL HERNIA REPAIR Bilateral 2002   IR ANGIO INTRA EXTRACRAN SEL COM CAROTID INNOMINATE UNI L MOD SED  11/16/2020   IR CT HEAD LTD  11/13/2020   IR PERCUTANEOUS ART THROMBECTOMY/INFUSION INTRACRANIAL INC DIAG ANGIO  11/13/2020   OVARIAN CYST REMOVAL Left    PEG PLACEMENT N/A 12/01/2020   Procedure: PERCUTANEOUS ENDOSCOPIC GASTROSTOMY (PEG) PLACEMENT;  Surgeon: Diamantina MonksLovick, Ayesha N, MD;  Location: [facility] ENDOSCOPY;  Service: General;  Laterality: N/A;   PERIPHERAL VASCULAR CATHETERIZATION N/A 01/16/2015   Procedure: Abdominal Aortogram;  Surgeon: Chuck Hinthristopher S Dickson, MD;  [facility];  Service: Cardiovascular;  Laterality: N/A;   POSTERIOR LUMBAR FUSION  2015   "have plates and screws in there"   RADIOLOGY WITH ANESTHESIA N/A 11/13/2020   Procedure: IR WITH ANESTHESIA;  Surgeon: Radiologist, Medication, MD;  Location: [facility] OR;  Service: Radiology;  Laterality: N/A;   TONSILLECTOMY       OB History   No obstetric history on file.     Family History  Problem Relation Age of Onset   Liver cancer Mother    Cancer Mother        Liver   Hypertension Mother    Lung cancer Father    Cancer Father        Lung   Breast cancer Sister    Cancer Sister        Breast    Social History   Tobacco Use    Smoking status: Former    Packs/day: 1.00    Years: 50.00    Pack years: 50.00    Types: Cigarettes    Quit date: 12/15/2014    Years since quitting: 5.9   Smokeless tobacco: Never  Vaping Use   Vaping Use: Never used  Substance Use Topics   Alcohol use: Yes    Comment: 11/18/2017 "might have a couple glasses of wine/month; if that"   Drug use: Never    Home Medications Prior to Admission medications   Medication Sig Start Date End Date Taking? Authorizing Provider  amiodarone (PACERONE) 200 MG tablet Place 1 tablet (200 mg total) into feeding tube daily. 12/06/20  Yes Danford, Earl Liteshristopher P, MD  apixaban (ELIQUIS) 5 MG TABS tablet Place 1 tablet (5 mg total) into feeding tube 2 (two) times daily. 12/06/20  Yes Danford, Earl Liteshristopher P, MD  arformoterol (BROVANA) 15 MCG/2ML NEBU Take 2 mLs (15 mcg total) by nebulization 2 (two) times  daily. 12/06/20  Yes Danford, Earl Lites, MD  budesonide (PULMICORT) 0.5 MG/2ML nebulizer solution Take 2 mLs (0.5 mg total) by nebulization 2 (two) times daily. 12/06/20  Yes Danford, Earl Lites, MD  buPROPion (WELLBUTRIN) 75 MG tablet Place 1 tablet (75 mg total) into feeding tube 2 (two) times daily. 12/06/20  Yes Danford, Earl Lites, MD  ezetimibe (ZETIA) 10 MG tablet Place 1 tablet (10 mg total) into feeding tube daily. 12/06/20  Yes Danford, Earl Lites, MD  hydrALAZINE (APRESOLINE) 50 MG tablet Place 1 tablet (50 mg total) into feeding tube 2 (two) times daily. 12/06/20  Yes Danford, Earl Lites, MD  insulin aspart (NOVOLOG FLEXPEN) 100 UNIT/ML FlexPen Inject 1-9 Units into the skin See admin instructions. 70-120=0 units, 121-150=1 unit, 151-200=2 units, 201-250=3 units, 251-300=5 units, 301-350=7 units, 351-400=9 units greater than 400 call MD   Yes [provider]  metoprolol tartrate (LOPRESSOR) 25 MG tablet Place 0.5 tablets (12.5 mg total) into feeding tube 2 (two) times daily. 12/06/20  Yes Danford, Earl Lites, MD  Nutritional  Supplements (FEEDING SUPPLEMENT, JEVITY 1.2 CAL,) LIQD Place 55 mL/hr into feeding tube continuous. 12/06/20  Yes Danford, Earl Lites, MD  Nutritional Supplements (FEEDING SUPPLEMENT, PROSOURCE TF,) liquid Place 45 mLs into feeding tube 2 (two) times daily. 12/06/20  Yes Danford, Earl Lites, MD  pantoprazole sodium (PROTONIX) 40 mg PACK Place 20 mLs (40 mg total) into feeding tube at bedtime. 12/06/20  Yes Danford, Earl Lites, MD  revefenacin (YUPELRI) 175 MCG/3ML nebulizer solution Take 3 mLs (175 mcg total) by nebulization daily. 12/06/20  Yes Danford, Earl Lites, MD  Water For Irrigation, Sterile (FREE WATER) SOLN Place 200 mLs into feeding tube every 4 (four) hours. 12/06/20  Yes Danford, Earl Lites, MD  acetaminophen (TYLENOL) 160 MG/5ML solution Place 20.3 mLs (650 mg total) into feeding tube every 4 (four) hours as needed for mild pain (or temp > 37.5 C (99.5 F)). Patient not taking: Reported on 12/12/2020 12/06/20   Alberteen Sam, MD  albuterol (PROVENTIL) (2.5 MG/3ML) 0.083% nebulizer solution Take 3 mLs (2.5 mg total) by nebulization every 4 (four) hours as needed for wheezing or shortness of breath. Patient not taking: Reported on 12/12/2020 12/06/20   Alberteen Sam, MD    Allergies    Amoxicillin, Atenolol, Crestor [rosuvastatin calcium], Pravastatin, Sulfa antibiotics, and Codeine  Review of Systems   Review of Systems  Unable to perform ROS: Patient nonverbal (Global aphasia)   Physical Exam Updated Vital Signs BP 132/83   Pulse (!) 105   Temp (!) 101.3 F (38.5 C) (Rectal)   Resp (!) 21   Ht 5\' 3"  (1.6 m)   Wt 53 kg   LMP  (LMP Unknown)   SpO2 94%   BMI 20.70 kg/m   Physical Exam Vitals and nursing note reviewed.  Constitutional:      Appearance: She is ill-appearing.  HENT:     Head: Normocephalic and atraumatic.  Eyes:     General:        Right eye: No discharge.        Left eye: No discharge.  Cardiovascular:     Rate and Rhythm:  Tachycardia present. Rhythm irregular.  Pulmonary:     Breath sounds: Rales present.     Comments: Tachypneic.  Abdominal:     General: There is no distension.     Palpations: Abdomen is soft.     Tenderness: There is no abdominal tenderness. There is no guarding or rebound.  Comments: PEG  Musculoskeletal:     Cervical back: Neck supple.     Comments: Very trace lower leg edema bilaterally.   Skin:    General: Skin is warm and dry.  Neurological:     Mental Status: She is alert.     Comments: Right sided weakness noted. Global aphasia.     ED Results / Procedures / Treatments   Labs (all labs ordered are listed, but only abnormal results are displayed) Labs Reviewed  COMPREHENSIVE METABOLIC PANEL - Abnormal; Notable for the following components:      Result Value   Glucose, Bld 117 (*)    BUN 34 (*)    Albumin 2.9 (*)    ALT 48 (*)    Total Bilirubin 1.3 (*)    All other components within normal limits  CBC WITH DIFFERENTIAL/PLATELET - Abnormal; Notable for the following components:   WBC 19.0 (*)    RDW 18.4 (*)    Neutro Abs 17.2 (*)    Abs Immature Granulocytes 0.12 (*)    All other components within normal limits  BRAIN NATRIURETIC PEPTIDE - Abnormal; Notable for the following components:   B Natriuretic Peptide 970.9 (*)    All other components within normal limits  PROTIME-INR - Abnormal; Notable for the following components:   Prothrombin Time 19.9 (*)    INR 1.7 (*)    All other components within normal limits  TROPONIN I (HIGH SENSITIVITY) - Abnormal; Notable for the following components:   Troponin I (High Sensitivity) 173 (*)    All other components within normal limits  RESP PANEL BY RT-PCR (FLU A&B, COVID) ARPGX2  CULTURE, BLOOD (ROUTINE X 2)  CULTURE, BLOOD (ROUTINE X 2)  URINE CULTURE  LACTIC ACID, PLASMA  LACTIC ACID, PLASMA  APTT  URINALYSIS, ROUTINE W REFLEX MICROSCOPIC  MISCELLANEOUS GENETIC TEST  TROPONIN I (HIGH SENSITIVITY)     EKG EKG Interpretation  Date/Time:  Tuesday December 12 2020 11:27:37 EDT Ventricular Rate:  111 PR Interval:    QRS Duration: 95 QT Interval:  410 QTC Calculation: 542 R Axis:   -49 Text Interpretation: Atrial flutter LVH with secondary repolarization abnormality Prolonged QT interval no acute change from prior Confirmed by Marianna Fuss ([id number]) on 12/12/2020 11:47:32 AM  Radiology DG Chest Portable 1 View  Result Date: 12/12/2020 CLINICAL DATA:  Dyspnea, respiratory distress EXAM: PORTABLE CHEST 1 VIEW COMPARISON:  Chest radiograph 12/01/2020 FINDINGS: The cardiomediastinal silhouette is stable. There is confluent opacity in the right base which has increased in conspicuity since the prior study. Retrocardiac consolidation is similar to the prior study. There are bilateral pleural effusions; hazy opacity in the right lower lung likely reflects layering fluid. The effusion appears increased in size on the right. The upper lungs remain well aerated. There is no pneumothorax. The bones are stable. IMPRESSION: 1. Increased right pleural effusion with worsening opacity in the right base; ongoing/worsening infection can not be excluded. 2. Overall unchanged left pleural effusion with adjacent consolidation which may reflect infection or atelectasis. Electronically Signed   By: Lesia Hausen M.D.   On: 12/12/2020 12:35    Procedures .Critical Care  Date/Time: 12/12/2020 3:10 PM Performed by: Cherly Anderson, PA-C Authorized by: Cherly Anderson, PA-C    CRITICAL CARE Performed by: Harvie Heck   Total critical care time: 35 minutes  Critical care time was exclusive of separately billable procedures and treating other patients.  Critical care was necessary to treat or prevent imminent or life-threatening deterioration.  Critical care was time spent personally by me on the following activities: development of treatment plan with patient and/or surrogate as well as  nursing, discussions with consultants, evaluation of patient's response to treatment, examination of patient, obtaining history from patient or surrogate, ordering and performing treatments and interventions, ordering and review of laboratory studies, ordering and review of radiographic studies, pulse oximetry and re-evaluation of patient's condition.  Medications Ordered in ED Medications  cefTRIAXone (ROCEPHIN) 2 g in sodium chloride 0.9 % 100 mL IVPB (0 g Intravenous Stopped 12/12/20 1456)  acetaminophen (TYLENOL) suppository 650 mg (650 mg Rectal Given 12/12/20 1422)    ED Course  I have reviewed the triage vital signs and the nursing notes.  Pertinent labs & imaging results that were available during my care of the patient were reviewed by me and considered in my medical decision making (see chart for details).    MDM Rules/Calculators/A&P                           Patient presents to the ED for evaluation of increased work of breathing today.  Patient appears ill, she is tachycardic in A. fib with RVR, she is requiring supplemental oxygen with tachypnea.  She has coarse breath sounds.  Rectal temp was checked and noted to be elevated at 101.3.  Concern for sepsis given patient meets SIRS criteria, antibiotics ordered for coverage of aspiration pneumonia as we feel this is the most likely source.   Additional history obtained:  Additional history obtained from chart review & nursing note review.  Recent hospital admission 11/13/20-12/06/20- admission for acute left MCA stroke, underwent thrombectomy and required intubation in the ER but was subsequently extubated the following day. Had afib during admission, received amiodarone. Overall was hypoxic throughout stay, flare of CHF after extubation which improved with diuresis, overall felt to be secondary to deconditioning/atelectasis and emphysema. DC home with PRN 1L via [place].   Lab Tests:  I Ordered, reviewed, and interpreted labs, which  included:  CBC: Leukocytosis at 19,000 with left shift. CMP: Elevated BUN and total bilirubin, as well as mildly low albumin no critical electrolyte derangement. PT/INR: Mildly elevated APTT: Within normal notes Troponin: Elevated, suspect secondary to demand. BNP: Elevated Lactic acid: Within normal limits  Imaging Studies ordered:  I ordered imaging studies which included chest x-ray, I independently reviewed, formal radiology impression shows:  1. Increased right pleural effusion with worsening opacity in the right base; ongoing/worsening infection can not be excluded. 2. Overall unchanged left pleural effusion with adjacent consolidation which may reflect infection or atelectasis  ED Course:  Patient meet SIRS criteria with findings concerning for pneumonia on chest x-ray.  Antibiotics have been started-initially ordered Unasyn for aspiration coverage, however this was altered by pharmacy based on patient's prior allergies and tolerated medications.  No 30 cc/kg bolus given patient is not hypotensive and does not have a significantly elevated lactic acid in the setting of heart failure.  Plan to discuss with hospital service for admission.  14:55: CONSULT: Discussed with hospitalist Dr. Katrinka Blazing- accepts admission.   This is a shared visit with supervising physician Dr. Stevie Kern who has independently evaluated patient & provided guidance in evaluation/management/disposition, in agreement with care   Portions of this note were generated with Dragon dictation software. Dictation errors may occur despite best attempts at proofreading.  Final Clinical Impression(s) / ED Diagnoses Final diagnoses:  Sepsis, due to unspecified organism, unspecified whether acute organ dysfunction present (  [facility])    Rx / DC Orders ED Discharge Orders     None        Cherly Anderson, PA-C 12/12/20 1512    Milagros Loll, MD 12/14/20 1444

## 2020-12-12 NOTE — Progress Notes (Signed)
Notified bedside nurse of need to administer antibiotics.  

## 2020-12-12 NOTE — H&P (Addendum)
History and Physical    Beverly Li LEX:[id number] DOB: [birth year] DOA: 12/12/2020  Referring MD/NP/PA: Harvie Heck, PA-C PCP: Clayborn Heron, MD  Patient coming from: [facility] via EMS  Chief Complaint: Respiratory distress  I have personally briefly reviewed patient's old medical records in [facility]   HPI: Beverly Li is a 75 y.o. 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  Patient is nonverbal following her stroke and her husband provides history.  When her husband left her yesterday she had been doing well and had been working with speech therapy and taking sips of sugar water.  However, once he returned this morning noted that the patient was having labored breathing.  She had left the hospital on 1 L oxygen, but she seemed to be struggling to breathe for which he requested staff to increase her oxygen.  They have been keeping the head of her bed elevated at 30 degrees to decrease risks of aspiration.  She had not been on any other oral foods and had been receiving all nutrition through her PEG tube.  Husband notes that the patient was given Eliquis prior to leaving the facility today.  He relates her Eliquis being held when she had a hematoma develop on her side as the cause of her having a stroke and notes that he does not want her to miss any doses.  ED Course: Upon admission into the emergency department patient was seen to be febrile up to 101.3 F, respirations 20-25, heart rates 10 5-1 50, and O2 saturation currently maintained on 4 L nasal cannula oxygen.  Labs significant for WBC 19, BUN 34, creatinine 0.68, high-sensitivity troponin 173, and lactic acid 1.1->1.Chest x-ray noted increased right pleural effusion  with worsening opacity in the right lung base with unchanged left pleural effusion.  Patient had been started on empiric antibiotics of Rocephin and given Tylenol 650 mg rectally.  Review of Systems  Respiratory:  Positive for shortness of breath.    Past Medical History:  Diagnosis Date   Anxiety    Arthritis    "some in my lower back; probably elbows, knees" (11/18/2017)   Atrial fibrillation (HCC)    Bell's palsy    when pt. was 75 yrs old, when under stress the left side of face will droop.   Complication of anesthesia    "vascular OR 2016; BP bottomed out; couldn't get it regulated; ended up in ICU for DAYS" (11/18/2017)   GERD (gastroesophageal reflux disease)    History of kidney stones    Hypertension    Hypertrophic cardiomyopathy (HCC)    severe LV basilar hypertrophy witn no evidence of significant outflow tract obstruction, EF 65-70%, mild LAE, mild TR, grade 1a diastolic dysfunction 05/15/10 (Dr. Donato Schultz) (Atrial Septal Hypertrophy pattern)-- Intra-op TEE with dsignificant outflow tract obstruction - AI, MR & TR   Insomnia    Mild aortic sclerosis    Osteopenia    Peripheral vascular disease (HCC)    Syncope    , Vagal    Past Surgical History:  Procedure Laterality Date   AUGMENTATION MAMMAPLASTY Bilateral    BACK SURGERY     CARDIAC CATHETERIZATION N/A 05/07/2016   Procedure: Left Heart Cath and Coronary Angiography;  Surgeon: Kathleene Hazel, MD;  Location: [facility] INVASIVE CV LAB;  Service: Cardiovascular;  Laterality: N/A;   CARDIOVERSION N/A 09/24/2017   Procedure: CARDIOVERSION;  Surgeon: Laurey MoraleMcLean, Dalton S, MD;  [facility];  Service: Cardiovascular;  Laterality: N/A;   DILATION AND CURETTAGE OF UTERUS     ENDARTERECTOMY FEMORAL Right 03/02/2015   Procedure: ENDARTERECTOMY RIGHT FEMORAL;  Surgeon: Chuck Hinthristopher S Dickson, MD;  [facility];  Service: Vascular;  Laterality: Right;   ESOPHAGOGASTRODUODENOSCOPY (EGD) WITH PROPOFOL N/A 12/01/2020    Procedure: ESOPHAGOGASTRODUODENOSCOPY (EGD) WITH PROPOFOL;  Surgeon: Diamantina MonksLovick, Ayesha N, MD;  Location: [facility] ENDOSCOPY;  Service: General;  Laterality: N/A;   FACIAL COSMETIC SURGERY Left 2002   "related to Bell's Palsy @ age 75; left eye/side of face droopy; tried to make area symmetrical"   FEMORAL-POPLITEAL BYPASS GRAFT Right 03/02/2015   Procedure: BYPASS GRAFT FEMORAL-BELOW KNEE POPLITEAL ARTERY;  Surgeon: Chuck Hinthristopher S Dickson, MD;  Location: [facility] OR;  Service: Vascular;  Laterality: Right;   INGUINAL HERNIA REPAIR Bilateral 2002   IR ANGIO INTRA EXTRACRAN SEL COM CAROTID INNOMINATE UNI L MOD SED  11/16/2020   IR CT HEAD LTD  11/13/2020   IR PERCUTANEOUS ART THROMBECTOMY/INFUSION INTRACRANIAL INC DIAG ANGIO  11/13/2020   OVARIAN CYST REMOVAL Left    PEG PLACEMENT N/A 12/01/2020   Procedure: PERCUTANEOUS ENDOSCOPIC GASTROSTOMY (PEG) PLACEMENT;  Surgeon: Diamantina MonksLovick, Ayesha N, MD;  Location: [facility] ENDOSCOPY;  Service: General;  Laterality: N/A;   PERIPHERAL VASCULAR CATHETERIZATION N/A 01/16/2015   Procedure: Abdominal Aortogram;  Surgeon: Chuck Hinthristopher S Dickson, MD;  [facility];  Service: Cardiovascular;  Laterality: N/A;   POSTERIOR LUMBAR FUSION  2015   "have plates and screws in there"   RADIOLOGY WITH ANESTHESIA N/A 11/13/2020   Procedure: IR WITH ANESTHESIA;  Surgeon: Radiologist, Medication, MD;  Location: [facility] OR;  Service: Radiology;  Laterality: N/A;   TONSILLECTOMY       reports that she quit smoking about 5 years ago. Her smoking use included cigarettes. She has a 50.00 pack-year smoking history. She has never used smokeless tobacco. She reports current alcohol use. She reports that she does not use drugs.  Allergies  Allergen Reactions   Amoxicillin Other (See Comments)    UTI Has patient had a PCN reaction causing immediate rash, facial/tongue/throat swelling, SOB or lightheadedness with hypotension: No Has patient had a PCN reaction causing severe rash involving mucus membranes  or skin necrosis: No Has patient had a PCN reaction that required hospitalization: No Has patient had a PCN reaction occurring within the last 10 years: Yes--UTI ONLY If all of the above answers are "NO", then may proceed with Cephalosporin use.    Atenolol Cough   Crestor [Rosuvastatin Calcium] Other (See Comments)    Muscle aches   Pravastatin Other (See Comments)    Muscle aches   Sulfa Antibiotics Nausea Only   Codeine     hallucinations    Family History  Problem Relation Age of Onset   Liver cancer Mother    Cancer Mother        Liver   Hypertension Mother    Lung cancer Father    Cancer Father        Lung   Breast cancer Sister    Cancer Sister        Breast    Prior to Admission medications   Medication Sig Start Date End Date Taking? Authorizing Provider  amiodarone (PACERONE) 200 MG tablet Place 1 tablet (200 mg total) into feeding tube daily. 12/06/20  Yes Danford, Earl Liteshristopher P, MD  apixaban (ELIQUIS) 5 MG TABS tablet Place 1 tablet (5 mg total) into feeding tube 2 (two) times daily. 12/06/20  Yes Danford, Earl Lites, MD  arformoterol (BROVANA) 15 MCG/2ML NEBU Take 2 mLs (15 mcg total) by nebulization 2 (two) times daily. 12/06/20  Yes Danford, Earl Lites, MD  budesonide (PULMICORT) 0.5 MG/2ML nebulizer solution Take 2 mLs (0.5 mg total) by nebulization 2 (two) times daily. 12/06/20  Yes Danford, Earl Lites, MD  buPROPion (WELLBUTRIN) 75 MG tablet Place 1 tablet (75 mg total) into feeding tube 2 (two) times daily. 12/06/20  Yes Danford, Earl Lites, MD  ezetimibe (ZETIA) 10 MG tablet Place 1 tablet (10 mg total) into feeding tube daily. 12/06/20  Yes Danford, Earl Lites, MD  hydrALAZINE (APRESOLINE) 50 MG tablet Place 1 tablet (50 mg total) into feeding tube 2 (two) times daily. 12/06/20  Yes Danford, Earl Lites, MD  insulin aspart (NOVOLOG FLEXPEN) 100 UNIT/ML FlexPen Inject 1-9 Units into the skin See admin instructions. 70-120=0 units, 121-150=1 unit,  151-200=2 units, 201-250=3 units, 251-300=5 units, 301-350=7 units, 351-400=9 units greater than 400 call MD   Yes [provider]  metoprolol tartrate (LOPRESSOR) 25 MG tablet Place 0.5 tablets (12.5 mg total) into feeding tube 2 (two) times daily. 12/06/20  Yes Danford, Earl Lites, MD  Nutritional Supplements (FEEDING SUPPLEMENT, JEVITY 1.2 CAL,) LIQD Place 55 mL/hr into feeding tube continuous. 12/06/20  Yes Danford, Earl Lites, MD  Nutritional Supplements (FEEDING SUPPLEMENT, PROSOURCE TF,) liquid Place 45 mLs into feeding tube 2 (two) times daily. 12/06/20  Yes Danford, Earl Lites, MD  pantoprazole sodium (PROTONIX) 40 mg PACK Place 20 mLs (40 mg total) into feeding tube at bedtime. 12/06/20  Yes Danford, Earl Lites, MD  revefenacin (YUPELRI) 175 MCG/3ML nebulizer solution Take 3 mLs (175 mcg total) by nebulization daily. 12/06/20  Yes Danford, Earl Lites, MD  Water For Irrigation, Sterile (FREE WATER) SOLN Place 200 mLs into feeding tube every 4 (four) hours. 12/06/20  Yes Danford, Earl Lites, MD  acetaminophen (TYLENOL) 160 MG/5ML solution Place 20.3 mLs (650 mg total) into feeding tube every 4 (four) hours as needed for mild pain (or temp > 37.5 C (99.5 F)). Patient not taking: Reported on 12/12/2020 12/06/20   Alberteen Sam, MD  albuterol (PROVENTIL) (2.5 MG/3ML) 0.083% nebulizer solution Take 3 mLs (2.5 mg total) by nebulization every 4 (four) hours as needed for wheezing or shortness of breath. Patient not taking: Reported on 12/12/2020 12/06/20   Alberteen Sam, MD    Physical Exam:  Constitutional: Elderly female who appears ill Vitals:   12/12/20 1245 12/12/20 1303 12/12/20 1333 12/12/20 1425  BP: 123/75  (!) 135/97 132/83  Pulse: (!) 109  (!) 113 (!) 105  Resp: 20  (!) 21 (!) 21  Temp:      TempSrc:      SpO2: 93%  95% 94%  Weight:  53 kg    Height:  5\' 3"  (1.6 m)     Eyes: PERRL, lids and conjunctivae normal ENMT: Mucous membranes are dry  Posterior pharynx clear of any exudate or lesions.  Neck: normal, supple, no masses, no thyromegaly Respiratory: Mildly tachypneic with rales appreciated in the lower lung fields.  O2 saturation currently maintained on 3 L nasal cannula oxygen. Cardiovascular: Irregular irregular, no murmurs / rubs / gallops.  Trace lower extremity edema. 2+ pedal pulses. No carotid bruits.  Abdomen: PEG tube in place with abdominal binder present over the PEG tube.  Bowel sounds present. Musculoskeletal:  no clubbing / cyanosis. No joint deformity upper and lower extremities. Good ROM, no contractures. Normal muscle tone.  Skin: no rashes, lesions, ulcers. No induration Neurologic: CN 2-12 grossly intact.  Right-sided weakness and global aphasia from stroke.  Patient able to move the left upper and lower extremity. Psychiatric: Alert cannot test for orientation due to aphasia.    Labs on Admission: I have personally reviewed following labs and imaging studies  CBC: Recent Labs  Lab 12/06/20 0400 12/12/20 1201  WBC 11.0* 19.0*  NEUTROABS  --  17.2*  HGB 12.1 14.5  HCT 38.6 45.8  MCV 98.7 100.0  PLT 249 227   Basic Metabolic Panel: Recent Labs  Lab 12/12/20 1201  NA 138  K 4.6  CL 101  CO2 29  GLUCOSE 117*  BUN 34*  CREATININE 0.68  CALCIUM 8.9   GFR: Estimated Creatinine Clearance: 50.3 mL/min (by C-G formula based on SCr of 0.68 mg/dL). Liver Function Tests: Recent Labs  Lab 12/12/20 1201  AST 35  ALT 48*  ALKPHOS 83  BILITOT 1.3*  PROT 6.5  ALBUMIN 2.9*   No results for input(s): LIPASE, AMYLASE in the last 168 hours. No results for input(s): AMMONIA in the last 168 hours. Coagulation Profile: Recent Labs  Lab 12/12/20 1249  INR 1.7*   Cardiac Enzymes: No results for input(s): CKTOTAL, CKMB, CKMBINDEX, TROPONINI in the last 168 hours. BNP (last 3 results) No results for input(s): PROBNP in the last 8760 hours. HbA1C: No results for input(s): HGBA1C in the last 72  hours. CBG: Recent Labs  Lab 12/05/20 1721 12/05/20 2017 12/06/20 0001 12/06/20 0422 12/06/20 0733  GLUCAP 150* 133* 129* 141* 111*   Lipid Profile: No results for input(s): CHOL, HDL, LDLCALC, TRIG, CHOLHDL, LDLDIRECT in the last 72 hours. Thyroid Function Tests: No results for input(s): TSH, T4TOTAL, FREET4, T3FREE, THYROIDAB in the last 72 hours. Anemia Panel: No results for input(s): VITAMINB12, FOLATE, FERRITIN, TIBC, IRON, RETICCTPCT in the last 72 hours. Urine analysis:    Component Value Date/Time   COLORURINE YELLOW 11/13/2020 1728   APPEARANCEUR CLEAR 11/13/2020 1728   LABSPEC 1.045 (H) 11/13/2020 1728   PHURINE 7.0 11/13/2020 1728   GLUCOSEU NEGATIVE 11/13/2020 1728   HGBUR SMALL (A) 11/13/2020 1728   BILIRUBINUR NEGATIVE 11/13/2020 1728   KETONESUR 5 (A) 11/13/2020 1728   PROTEINUR NEGATIVE 11/13/2020 1728   UROBILINOGEN 1.0 02/27/2015 1127   NITRITE NEGATIVE 11/13/2020 1728   LEUKOCYTESUR NEGATIVE 11/13/2020 1728   Sepsis Labs: Recent Results (from the past 240 hour(s))  SARS CORONAVIRUS 2 (TAT 6-24 HRS) Nasopharyngeal Nasopharyngeal Swab     Status: None   Collection Time: 12/05/20  4:58 PM   Specimen: Nasopharyngeal Swab  Result Value Ref Range Status   SARS Coronavirus 2 NEGATIVE NEGATIVE Final    Comment: (NOTE) SARS-CoV-2 target nucleic acids are NOT DETECTED.  The SARS-CoV-2 RNA is generally detectable in upper and lower respiratory specimens during the acute phase of infection. Negative results do not preclude SARS-CoV-2 infection, do not rule out co-infections with other pathogens, and should not be used as the sole basis for treatment or other patient management decisions. Negative results must be combined with clinical observations, patient history, and epidemiological information. The expected result is Negative.  Fact Sheet for Patients: HairSlick.no  Fact Sheet for Healthcare  Providers: quierodirigir.com  This test is not yet approved or cleared by the [place] FDA and  has been authorized for detection and/or diagnosis of SARS-CoV-2 by FDA under  an Emergency Use Authorization (EUA). This EUA will remain  in effect (meaning this test can be used) for the duration of the COVID-19 declaration under Se ction 564(b)(1) of the Act, 21 U.S.C. section 360bbb-3(b)(1), unless the authorization is terminated or revoked sooner.  Performed at [facility], [address], [place], [place] [address]   Resp Panel by RT-PCR (Flu A&B, Covid) Nasopharyngeal Swab     Status: None   Collection Time: 12/12/20 11:39 AM   Specimen: Nasopharyngeal Swab; Nasopharyngeal(NP) swabs in vial transport medium  Result Value Ref Range Status   SARS Coronavirus 2 by RT PCR NEGATIVE NEGATIVE Final    Comment: (NOTE) SARS-CoV-2 target nucleic acids are NOT DETECTED.  The SARS-CoV-2 RNA is generally detectable in upper respiratory specimens during the acute phase of infection. The lowest concentration of SARS-CoV-2 viral copies this assay can detect is 138 copies/mL. A negative result does not preclude SARS-Cov-2 infection and should not be used as the sole basis for treatment or other patient management decisions. A negative result may occur with  improper specimen collection/handling, submission of specimen other than nasopharyngeal swab, presence of viral mutation(s) within the areas targeted by this assay, and inadequate number of viral copies(<138 copies/mL). A negative result must be combined with clinical observations, patient history, and epidemiological information. The expected result is Negative.  Fact Sheet for Patients:  BloggerCourse.com  Fact Sheet for Healthcare Providers:  SeriousBroker.it  This test is no t yet approved or cleared by the [place] FDA and  has been authorized  for detection and/or diagnosis of SARS-CoV-2 by FDA under an Emergency Use Authorization (EUA). This EUA will remain  in effect (meaning this test can be used) for the duration of the COVID-19 declaration under Section 564(b)(1) of the Act, 21 U.S.C.section 360bbb-3(b)(1), unless the authorization is terminated  or revoked sooner.       Influenza A by PCR NEGATIVE NEGATIVE Final   Influenza B by PCR NEGATIVE NEGATIVE Final    Comment: (NOTE) The Xpert Xpress SARS-CoV-2/FLU/RSV plus assay is intended as an aid in the diagnosis of influenza from Nasopharyngeal swab specimens and should not be used as a sole basis for treatment. Nasal washings and aspirates are unacceptable for Xpert Xpress SARS-CoV-2/FLU/RSV testing.  Fact Sheet for Patients: BloggerCourse.com  Fact Sheet for Healthcare Providers: SeriousBroker.it  This test is not yet approved or cleared by the [place] FDA and has been authorized for detection and/or diagnosis of SARS-CoV-2 by FDA under an Emergency Use Authorization (EUA). This EUA will remain in effect (meaning this test can be used) for the duration of the COVID-19 declaration under Section 564(b)(1) of the Act, 21 U.S.C. section 360bbb-3(b)(1), unless the authorization is terminated or revoked.  Performed at [facility], [address], [place], [place] [address]      Radiological Exams on Admission: DG Chest Portable 1 View  Result Date: 12/12/2020 CLINICAL DATA:  Dyspnea, respiratory distress EXAM: PORTABLE CHEST 1 VIEW COMPARISON:  Chest radiograph 12/01/2020 FINDINGS: The cardiomediastinal silhouette is stable. There is confluent opacity in the right base which has increased in conspicuity since the prior study. Retrocardiac consolidation is similar to the prior study. There are bilateral pleural effusions; hazy opacity in the right lower lung likely reflects layering fluid. The effusion  appears increased in size on the right. The upper lungs remain well aerated. There is no pneumothorax. The bones are stable. IMPRESSION: 1. Increased right pleural effusion with worsening opacity in the right  base; ongoing/worsening infection can not be excluded. 2. Overall unchanged left pleural effusion with adjacent consolidation which may reflect infection or atelectasis. Electronically Signed   By: Lesia Hausen M.D.   On: 12/12/2020 12:35    EKG: Independently reviewed.  Atrial flutter at 111 bpm with QTC 542  Assessment/Plan Sepsis secondary to pneumonia: Acute.  Patient presents with fever up to 101.3 F, tachycardia, and tachypnea.  WBC elevated at 19.  Chest x-ray significant for increasing right-sided pleural effusion with worsening opacity in right lung base.  Question possibility of bacterial pneumonia versus aspiration with possibility of compounding factor of CHF exacerbation.  She had been started on empiric antibiotics of Rocephin. -Admit to a medical telemetry bed -Follow-up blood and sputum cultures -Check procalcitonin -Incentive spirometry and pulmonary toiletry -Continue empiric antibiotics of Rocephin IV -Add on doxycycline IV for atypical coverage  Acute respiratory failure with hypoxia COPD, without exacerbation: Patient previously had been discharged on 1 L of oxygen needed, but percents back requiring 3 L to maintain O2 saturations.  She previously was without any known prior history of lung disease, but noted to have severe emphysema with small bilateral pleural effusions and compressive atelectasis on previous CT of the chest with contrast on 8/23.  Previous longtime smoker.  Symptoms thought to be multifactorial in nature in addition to pneumonia with concern for some fluid overload as well. -Continuous pulse oximetry with nasal cannula oxygen maintain O2 saturations. -Continue breathing treatments  Paroxysmal atrial fibrillation with RVR on chronic anticoagulation:  Patient present with heart rates elevated into the 150s.  -Continue Eliquis, metoprolol, and amiodarone  Diastolic CHF: Acute on chronic.  Patient  was noted to have worsening right-sided pleural effusion.  BNP elevated at 970.9.  Last echocardiogram revealed EF of 60 - 65%.  She was diuresed during previous hospitalization, but had been discharged not on any diuretics. -Strict intake and output -Daily weights -Lasix 40 mg IV x1 dose -Reevaluate in a.m., but patient may need to be on diuretic  Essential hypertension: Blood pressures currently controlled.  Home blood pressure medications include amiodarone 200 mg daily, hydralazine 50 mg twice daily, and metoprolol 12.5 mg twice daily. -Continue current blood pressure regimen as tolerated  History of CVA with residual deficits: Patient had been receiving therapies at [facility] after left MCA stroke status post thrombectomy. -PT/OT/speech to evaluate and treat  Dysphagia: Secondary to recent stroke PEG placed on 8/19. -Continue tube feeds with free water flushes  Anxiety -Continue Wellbutrin  Right femoral popliteal bypass graft stenosis -Patient previously recommended to follow-up with vascular surgery in 2 to 3 months  GERD -Continue protonix  DVT prophylaxis: Eliquis Code Status: Full Family Communication: Husband updated at bedside Disposition Plan: likely need to discharge back to rehab once medically stable Consults called: None Admission status: Inpatient, require more than 2 midnight stay  Clydie Braun MD [facility]   If 7PM-7AM, please contact night-coverage   12/12/2020, 2:55 PM

## 2020-12-12 NOTE — ED Triage Notes (Signed)
Patient comes from nursing home after having stroke earlier this month. Patient has global aphasia and right sided deficits. Patient with increased work of breathing. Given Solumedrol and mag in route by EMS

## 2020-12-12 NOTE — Progress Notes (Signed)
Notified provider of need to order antibiotics and fluid bolus.  

## 2020-12-12 NOTE — Progress Notes (Signed)
Unable to complete admission information in chart due to patient having global aphasia.

## 2020-12-12 NOTE — Progress Notes (Signed)
Elink following for code sepsis 

## 2020-12-13 LAB — GLUCOSE, CAPILLARY
Glucose-Capillary: 152 mg/dL — ABNORMAL HIGH (ref 70–99)
Glucose-Capillary: 183 mg/dL — ABNORMAL HIGH (ref 70–99)
Glucose-Capillary: 117 mg/dL — ABNORMAL HIGH (ref 70–99)
Glucose-Capillary: 138 mg/dL — ABNORMAL HIGH (ref 70–99)
Glucose-Capillary: 101 mg/dL — ABNORMAL HIGH (ref 70–99)

## 2020-12-13 LAB — BASIC METABOLIC PANEL
Anion gap: 10 (ref 5–15)
BUN: 39 mg/dL — ABNORMAL HIGH (ref 8–23)
CO2: 29 mmol/L (ref 22–32)
Calcium: 8.5 mg/dL — ABNORMAL LOW (ref 8.9–10.3)
Chloride: 98 mmol/L (ref 98–111)
Creatinine, Ser: 0.74 mg/dL (ref 0.44–1.00)
GFR, Estimated: >60 mL/min (ref >60)
Glucose, Bld: 116 mg/dL — ABNORMAL HIGH (ref 70–99)
Potassium: 4 mmol/L (ref 3.5–5.1)
Sodium: 137 mmol/L (ref 135–145)

## 2020-12-13 LAB — CBC WITH DIFFERENTIAL/PLATELET
Abs Immature Granulocytes: 0.06 10*3/uL (ref 0.00–0.07)
Basophils Absolute: 0 10*3/uL (ref 0.0–0.1)
Basophils Relative: 0 %
Eosinophils Absolute: 0 10*3/uL (ref 0.0–0.5)
Eosinophils Relative: 0 %
HCT: 39.8 % (ref 36.0–46.0)
Hemoglobin: 12.7 g/dL (ref 12.0–15.0)
Immature Granulocytes: 1 %
Lymphocytes Relative: 5 %
Lymphs Abs: 0.6 10*3/uL — ABNORMAL LOW (ref 0.7–4.0)
MCH: 31.8 pg (ref 26.0–34.0)
MCHC: 31.9 g/dL (ref 30.0–36.0)
MCV: 99.5 fL (ref 80.0–100.0)
Monocytes Absolute: 0.5 10*3/uL (ref 0.1–1.0)
Monocytes Relative: 4 %
Neutro Abs: 10.9 10*3/uL — ABNORMAL HIGH (ref 1.7–7.7)
Neutrophils Relative %: 90 %
Platelets: 206 10*3/uL (ref 150–400)
RBC: 4 MIL/uL (ref 3.87–5.11)
RDW: 18 % — ABNORMAL HIGH (ref 11.5–15.5)
WBC: 12 10*3/uL — ABNORMAL HIGH (ref 4.0–10.5)
nRBC: 0 % (ref 0.0–0.2)

## 2020-12-13 MED ORDER — HYDRALAZINE HCL 10 MG PO TABS
10.0000 mg | ORAL_TABLET | Freq: Two times a day (BID) | ORAL | Status: DC
  Administered 2020-12-14: 10 mg
  Filled 2020-12-13: qty 1

## 2020-12-13 MED ORDER — METRONIDAZOLE 500 MG/100ML IV SOLN
500.0000 mg | Freq: Two times a day (BID) | INTRAVENOUS | Status: DC
  Administered 2020-12-13 – 2020-12-15 (×6): 500 mg via INTRAVENOUS
  Filled 2020-12-13 (×7): qty 100

## 2020-12-13 NOTE — Evaluation (Signed)
Occupational Therapy Evaluation Patient Details Name: Beverly Li MRN: [id number] DOB: March 04, 1946 Today's Date: 12/13/2020    History of Present Illness Pt is a 75 y.o. female recently d/c to [facility] for rehab after admission 11/13/20-12/06/20 for multiple infarcts requiring revascularization and intubation, now readmitted 12/12/20 with respiratory distress. CXR noted increased R pleural effusion, unchanged L pleural effusion. Workup for sepsis secondary to PNA; question possibility of bacterial pneumonia versus aspiration with possibility of compounding factor of CHF exacerbation. 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   Clinical Impression   Discussed with pt. And husband the pros and cons of dc home with hh and snf. Pt. Husband is considering his options. Pt. 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 Pt. Does not havae any movement in r ue. Pt. Required extensive assist with mobility and adls. Acute ot to follow.     Follow Up Recommendations  SNF    Equipment Recommendations  3 in 1 bedside commode;Wheelchair (measurements OT);Wheelchair cushion (measurements OT);Hospital bed    Recommendations for Other Services       Precautions / Restrictions Precautions Precautions: Fall;Other (comment) Precaution Comments: R hemiplegia, PEG, adominal binder Restrictions Weight Bearing Restrictions: No      Mobility Bed Mobility Overal bed mobility: Needs Assistance Bed Mobility: Supine to Sit     Supine to sit: Max assist     General bed mobility comments: Pt initiating LLE movement, requiring hand over hand assist to place LUE on bed rail, maxA for scooting hips and trunk elevation    Transfers Overall transfer level: Needs  assistance Equipment used: 1 person hand held assist Transfers: Sit to/from Stand Sit to Stand: Mod assist;+2 physical assistance Stand pivot transfers: Max assist;+2 physical assistance       General transfer comment: Pt able to stand with LUE HHA and modA+2 for trunk elevation and to achieve near full upright posture; dependent on R knee blocked. Pt attempting to take steps on LLE despite R knee instability requiring totalA to block, maxA+2 for maintaining balance with stand pivot    Balance Overall balance assessment: Needs assistance Sitting-balance support: Single extremity supported;Feet supported Sitting balance-Leahy Scale: Poor Sitting balance - Comments: reliant on min-maxA external assist, stability improved with LUE support Postural control: Right lateral lean;Posterior lean Standing balance support: Bilateral upper extremity supported;During functional activity Standing balance-Leahy Scale: Zero                             ADL either performed or assessed with clinical judgement   ADL Overall ADL's : Needs assistance/impaired Eating/Feeding: NPO   Grooming: Maximal assistance;Cueing for sequencing;Cueing for compensatory techniques;Sitting   Upper Body Bathing: Sitting;Maximal assistance   Lower Body Bathing: Total assistance;+2 for physical assistance;Sit to/from stand   Upper Body Dressing : Maximal assistance;Sitting   Lower Body Dressing: Total assistance;+2 for physical assistance;Sit to/from stand   Toilet Transfer: Total assistance;+2 for physical assistance   Toileting- Clothing Manipulation and Hygiene: Total assistance;+2 for physical assistance       Functional mobility during ADLs: Total assistance;+2 for physical assistance General ADL Comments: pt. appears to have apraxia and needs hand over hand assist to initiate tasks.     Vision Baseline Vision/History: 1 Wears glasses (unknown what current vision is  secondary to  aphasia) Vision Assessment?:  (difficult to assess.)     Perception     Praxis      Pertinent Vitals/Pain Pain Assessment: Faces Pain Score: 0-No pain Faces Pain Scale: No hurt Pain Intervention(s): Monitored during session     Hand Dominance Right   Extremity/Trunk Assessment Upper Extremity Assessment Upper Extremity Assessment: RUE deficits/detail RUE Deficits / Details: Flaccid with no attention or activition of RUE. no responce to painful stimuli RUE Sensation: decreased light touch LUE Deficits / Details: Suspect at least 3/5 functional strength in LUE; pt engaging in task to wash face with L hand; inconsistent purposeful movements LUE Coordination: decreased fine motor   Lower Extremity Assessment Lower Extremity Assessment: Defer to PT evaluation RLE Deficits / Details: No active movement noted in RLE; good passive ROM, but noted calf/ankle tightness due to resting in PF LLE Deficits / Details: Generalized weakness; able to accept weight on LLE in standing without knee buckling, attempting to hop on L foot despite R knee buckling   Cervical / Trunk Assessment Cervical / Trunk Assessment: Kyphotic;Other exceptions Cervical / Trunk Exceptions: Sits with trunk rotated and laterally flexed to R; noted cervical tightness and decreased trunk control   Communication Communication Communication: Receptive difficulties;Expressive difficulties   Cognition Arousal/Alertness: Awake/alert Behavior During Therapy: Flat affect Overall Cognitive Status: Difficult to assess Area of Impairment: Following commands;Awareness;Problem solving;Safety/judgement                   Current Attention Level: Sustained;Focused   Following Commands: Follows one step commands inconsistently;Follows one step commands with increased time Safety/Judgement: Decreased awareness of safety;Decreased awareness of deficits Awareness: Intellectual Problem Solving: Slow processing;Difficulty  sequencing;Decreased initiation;Requires verbal cues;Requires tactile cues General Comments: Pt making and maintaining eye contact on both sides; follows one step commands inconsistently with L-side. Seems to make appropriate facial expressions in response to conversation, inconsistent. Able to give thumbs up, but unsure if pt appropriately relating it to yes/no questions. Husband present and reports they have yet to find a way to communicate consistently/effectively   General Comments  SpO2 down to 86% on RA, replaced 2L O2. Pt's husband present and supportive; family considering having pt d/c home with hired support instead of returning to SNF, but unsure    Exercises Exercises: General Upper Extremity General Exercises - Upper Extremity Shoulder Flexion: PROM;Right;10 reps;Seated Shoulder Extension: PROM;Right;10 reps;Seated Elbow Flexion: PROM;Right;10 reps;Seated Elbow Extension: PROM;10 reps;Seated Digit Composite Flexion: PROM;Right;20 reps;Seated Composite Extension: PROM;Right;10 reps;Seated   Shoulder Instructions      Home Living Family/patient expects to be discharged to:: Private residence Living Arrangements: Spouse/significant other Available Help at Discharge: Family;Available 24 hours/day Type of Home: House Home Access: Stairs to enter [employer] of Steps: 1 Entrance Stairs-Rails: None Home Layout: Two level;Able to live on main level with bedroom/bathroom     Bathroom Shower/Tub: Chief Strategy Officer: Standard     Home Equipment: Toilet riser;Walker - 2 wheels;Shower seat;Grab bars - tub/shower;Wheelchair - manual;Cane - single point   Additional Comments: Recent admission for stroke with d/c to [facility] 12/06/20. Husband reports unsure if pt to return home, or if able to come home with hired caregivers      Prior Functioning/Environment Level of Independence: Needs assistance  Gait / Transfers Assistance Needed: Prior to stroke  11/13/20, pt independent without DME. Since stroke, has been at rehab since 12/06/20 with R-side hemiplegia and aphasia, assist for standing and transfers ADL's / Homemaking Assistance Needed:  Prior to stroke 11/13/20, pt independent with ADLs/iADLs (manages her own finances and medicines without assist). Since stroke, has been at rehab since 12/06/20 with R-side hemiplegia and aphasia, requires assist from staff for all ADLs Communication / Swallowing Assistance Needed: aphasic since cva Comments: Pt was able to perform all ADLs, mobility, and manage finances/meds without assistance.        OT Problem List: Decreased strength;Decreased range of motion;Decreased activity tolerance;Impaired balance (sitting and/or standing);Impaired vision/perception;Decreased coordination;Decreased cognition;Decreased safety awareness;Decreased knowledge of use of DME or AE;Decreased knowledge of precautions;Cardiopulmonary status limiting activity;Impaired UE functional use      OT Treatment/Interventions: Self-care/ADL training;Therapeutic exercise;Neuromuscular education;Energy conservation;DME and/or AE instruction;Manual therapy;Modalities;Therapeutic activities;Cognitive remediation/compensation;Visual/perceptual remediation/compensation;Patient/family education;Balance training    OT Goals(Current goals can be found in the care plan section) Acute Rehab OT Goals Patient Stated Goal: Family unsure if pt to return to SNF for rehab, or home with 24/7 support OT Goal Formulation: With patient/family Time For Goal Achievement: 12/27/20 Potential to Achieve Goals: Fair  OT Frequency: Min 2X/week   Barriers to D/C: Decreased caregiver support          Co-evaluation              AM-PAC OT "6 Clicks" Daily Activity     Outcome Measure Help from another person eating meals?: Total Help from another person taking care of personal grooming?: A Lot Help from another person toileting, which includes using  toliet, bedpan, or urinal?: Total Help from another person bathing (including washing, rinsing, drying)?: Total Help from another person to put on and taking off regular upper body clothing?: Total Help from another person to put on and taking off regular lower body clothing?: Total 6 Click Score: 7   End of Session Equipment Utilized During Treatment: Gait belt;Oxygen Nurse Communication:  (ok therapy)  Activity Tolerance: Patient tolerated treatment well Patient left: in bed;with call bell/phone within reach;with bed alarm set;with nursing/sitter in room;with family/visitor present  OT Visit Diagnosis: Unsteadiness on feet (R26.81);Muscle weakness (generalized) (M62.81);Hemiplegia and hemiparesis Hemiplegia - Right/Left: Right Hemiplegia - dominant/non-dominant: Dominant Hemiplegia - caused by: Cerebral infarction                Time: 6720-9470 OT Time Calculation (min): 55 min Charges:  OT General Charges $OT Visit: 1 Visit OT Evaluation $OT Eval Moderate Complexity: 1 Mod OT Treatments $Self Care/Home Management : 8-22 mins  Derrek Gu OT/L   Gaylen Pereira 12/13/2020, 2:40 PM

## 2020-12-13 NOTE — Evaluation (Signed)
Clinical/Bedside Swallow Evaluation Patient Details  Name: Beverly Li MRN: [id number] Date of Birth: February 05, 1946  Today's Date: 12/13/2020 Time: SLP Start Time (ACUTE ONLY): 1538 SLP Stop Time (ACUTE ONLY): 1630 SLP Time Calculation (min) (ACUTE ONLY): 52 min  Past Medical History:  Past Medical History:  Diagnosis Date   Anxiety    Arthritis    "some in my lower back; probably elbows, knees" (11/18/2017)   Atrial fibrillation (HCC)    Bell's palsy    when pt. was 75 yrs old, when under stress the left side of face will droop.   Complication of anesthesia    "vascular OR 2016; BP bottomed out; couldn't get it regulated; ended up in ICU for DAYS" (11/18/2017)   GERD (gastroesophageal reflux disease)    History of kidney stones    Hypertension    Hypertrophic cardiomyopathy (HCC)    severe LV basilar hypertrophy witn no evidence of significant outflow tract obstruction, EF 65-70%, mild LAE, mild TR, grade 1a diastolic dysfunction 05/15/10 (Dr. Donato Schultz) (Atrial Septal Hypertrophy pattern)-- Intra-op TEE with dsignificant outflow tract obstruction - AI, MR & TR   Insomnia    Mild aortic sclerosis    Osteopenia    Peripheral vascular disease (HCC)    Syncope    , Vagal   Past Surgical History:  Past Surgical History:  Procedure Laterality Date   AUGMENTATION MAMMAPLASTY Bilateral    BACK SURGERY     CARDIAC CATHETERIZATION N/A 05/07/2016   Procedure: Left Heart Cath and Coronary Angiography;  Surgeon: Kathleene Hazel, MD;  Location: [facility] INVASIVE CV LAB;  Service: Cardiovascular;  Laterality: N/A;   CARDIOVERSION N/A 09/24/2017   Procedure: CARDIOVERSION;  Surgeon: Laurey Morale, MD;  Location: [facility] ENDOSCOPY;  Service: Cardiovascular;  Laterality: N/A;   DILATION AND CURETTAGE OF UTERUS     ENDARTERECTOMY FEMORAL Right 03/02/2015   Procedure: ENDARTERECTOMY RIGHT FEMORAL;  Surgeon: Chuck Hint, MD;  Location: [facility] OR;  Service: Vascular;  Laterality: Right;    ESOPHAGOGASTRODUODENOSCOPY (EGD) WITH PROPOFOL N/A 12/01/2020   Procedure: ESOPHAGOGASTRODUODENOSCOPY (EGD) WITH PROPOFOL;  Surgeon: Diamantina Monks, MD;  Location: [facility] ENDOSCOPY;  Service: General;  Laterality: N/A;   FACIAL COSMETIC SURGERY Left 2002   "related to Bell's Palsy @ age 49; left eye/side of face droopy; tried to make area symmetrical"   FEMORAL-POPLITEAL BYPASS GRAFT Right 03/02/2015   Procedure: BYPASS GRAFT FEMORAL-BELOW KNEE POPLITEAL ARTERY;  Surgeon: Chuck Hint, MD;  Location: [facility] OR;  Service: Vascular;  Laterality: Right;   INGUINAL HERNIA REPAIR Bilateral 2002   IR ANGIO INTRA EXTRACRAN SEL COM CAROTID INNOMINATE UNI L MOD SED  11/16/2020   IR CT HEAD LTD  11/13/2020   IR PERCUTANEOUS ART THROMBECTOMY/INFUSION INTRACRANIAL INC DIAG ANGIO  11/13/2020   OVARIAN CYST REMOVAL Left    PEG PLACEMENT N/A 12/01/2020   Procedure: PERCUTANEOUS ENDOSCOPIC GASTROSTOMY (PEG) PLACEMENT;  Surgeon: Diamantina Monks, MD;  Location: [facility] ENDOSCOPY;  Service: General;  Laterality: N/A;   PERIPHERAL VASCULAR CATHETERIZATION N/A 01/16/2015   Procedure: Abdominal Aortogram;  Surgeon: Chuck Hint, MD;  Location: [facility] INVASIVE CV LAB;  Service: Cardiovascular;  Laterality: N/A;   POSTERIOR LUMBAR FUSION  2015   "have plates and screws in there"   RADIOLOGY WITH ANESTHESIA N/A 11/13/2020   Procedure: IR WITH ANESTHESIA;  Surgeon: Radiologist, Medication, MD;  Location: [facility] OR;  Service: Radiology;  Laterality: N/A;   TONSILLECTOMY     HPI:  Pt is a 75 y.o. female  recently d/c to [facility] for rehab after admission 11/13/20-12/06/20 for multiple infarcts requiring revascularization and intubation, now readmitted 12/12/20 with respiratory distress. CXR noted increased R pleural effusion, unchanged L pleural effusion. Workup for sepsis secondary to PNA; question possibility of bacterial pneumonia versus aspiration with possibility of compounding factor of CHF exacerbation. PMH includes multiple  infarcts 11/2020 (L MCA and ACA territory infarcts, scattered punctate infarcts R frontoparietal cortex, R cerebellum), COPD, afib, PAD, HTN, cardiomyopathy, aortic stenosis, Bell's palsy, anxiety, osteopenia. MBS 8/24 with dx of severe primarily oral dysphagia with adequate ability to protect airway with nectar-thick and puree - recommended therapeutic trials of puree/teaspoons of nectar with SLP>   Assessment / Plan / Recommendation Clinical Impression  Pt known to this SLP from recent admission. Oral suctioning set-up at bedside and pt participated in her own oral care with support and assistance from SLP. Provided with ice chips and several spoonsful of pudding.  Ms. Schomer demonstrates clinical improvements in swallowing - she self-fed teaspoon of puree with adequate anticipation of approaching spoon, good oral seal, prolonged oral movement but improved initiation and palpable swallow response. There were no overt s/s of aspiration.  Liquids were not provided today.    Based on review of last MBS (8/24) and performance today, she can resume therapeutic trials of purees/nectars with SLP only. Otherwise continue NPO status with PEG.  Pt needs vigilant oral care; with set-up and help she can brush her own teeth with LUE with finishing support from staff. D/W Mr. Aikens.  Will resume SLP f/u while admitted to acute care. SLP Visit Diagnosis: Dysphagia, oral phase (R13.11)    Aspiration Risk  Mild aspiration risk    Diet Recommendation   npo  Medication Administration: Via alternative means    Other  Recommendations Other Recommendations: Have oral suction available   Follow up Recommendations Inpatient Rehab      Frequency and Duration min 2x/week  1 week       Prognosis Prognosis for Safe Diet Advancement: Guarded Barriers to Reach Goals: Language deficits      Swallow Study   General HPI: Pt is a 75 y.o. female recently d/c to [facility] for rehab after admission 11/13/20-12/06/20  for multiple infarcts requiring revascularization and intubation, now readmitted 12/12/20 with respiratory distress. CXR noted increased R pleural effusion, unchanged L pleural effusion. Workup for sepsis secondary to PNA; question possibility of bacterial pneumonia versus aspiration with possibility of compounding factor of CHF exacerbation. 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 MBS 8/24 with dx of severe primarily oral dysphagia with adequate ability to protect airway with nectar-thick and puree - recommended therapeutic trials of puree/teaspoons of nectar with SLP> Type of Study: Bedside Swallow Evaluation Previous Swallow Assessment: see HPI Diet Prior to this Study: NPO Temperature Spikes Noted: No Respiratory Status: Nasal cannula History of Recent Intubation: No Behavior/Cognition: Alert;Requires cueing Oral Cavity Assessment: Dried secretions Oral Care Completed by SLP: Yes Oral Cavity - Dentition: Adequate natural dentition Vision: Functional for self-feeding Self-Feeding Abilities: Able to feed self Patient Positioning: Upright in bed Baseline Vocal Quality: Not observed Volitional Cough: Cognitively unable to elicit Volitional Swallow: Unable to elicit    Oral/Motor/Sensory Function Overall Oral Motor/Sensory Function: Severe impairment Facial ROM: Reduced right;Suspected CN VII (facial) dysfunction Facial Symmetry: Abnormal symmetry right;Suspected CN VII (facial) dysfunction Facial Strength: Reduced right;Suspected CN  VII (facial) dysfunction Lingual ROM: Reduced right;Reduced left Lingual Symmetry: Within Functional Limits   Ice Chips Ice chips: Impaired Presentation: Spoon Pharyngeal Phase Impairments: Cough - Immediate   Thin Liquid Thin Liquid: Not tested    Nectar Thick Nectar  Thick Liquid: Not tested   Honey Thick Honey Thick Liquid: Not tested   Puree Puree: Impaired Presentation: Self Fed;Spoon Oral Phase Functional Implications: Prolonged oral transit   Solid     Solid: Not tested      [name] 12/13/2020,4:40 PM  Marchelle Folks L. Samson Frederic, MA CCC/SLP Acute Rehabilitation Services Office number [phone] Pager [phone]

## 2020-12-13 NOTE — Evaluation (Signed)
Physical Therapy Evaluation Patient Details Name: Beverly Li MRN: [id number] DOB: 1946-04-05 Today's Date: 12/13/2020   History of Present Illness  Pt is a 75 y.o. female recently d/c to [facility] for rehab after admission 11/13/20-12/06/20 for multiple infarcts requiring revascularization and intubation, now readmitted 12/12/20 with respiratory distress. CXR noted increased R pleural effusion, unchanged L pleural effusion. Workup for sepsis secondary to PNA; question possibility of bacterial pneumonia versus aspiration with possibility of compounding factor of CHF exacerbation. 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   Clinical Impression  Pt presents with an overall decrease in functional mobility secondary to above. Pt recently admitted for stroke 11/13/20 with d/c to SNF on 12/06/20 for post-acute rehab; prior to initial admission, pt independent, active, lives with supportive husband. Today, pt requiring mod-maxA+2 to initiate stand pivot transfers. Pt with R-side hemiplegia, R-side inattention and aphasia; inconsistent following simple commands, unable to verbalize responses although attempting to mouth words at times. Pt's husband present and supportive; family trying to decide if pt to continue with post-acute rehab at a facility, or return home with hired caregiver. Feel pt would benefit from intensive post-acute therapies before return home, especially since stroke occurred so recently. Will follow acutely to address established goals.    Follow Up Recommendations CIR;Supervision/Assistance - 24 hour vs. Return to SNF (TBD)    Equipment Recommendations  Wheelchair (measurements PT);Wheelchair cushion (measurements PT);Hospital bed;Hoyer lift    Recommendations for Other Services       Precautions  / Restrictions Precautions Precautions: Fall;Other (comment) Precaution Comments: R hemiplegia, PEG, adominal binder Restrictions Weight Bearing Restrictions: No      Mobility  Bed Mobility Overal bed mobility: Needs Assistance Bed Mobility: Supine to Sit     Supine to sit: Max assist     General bed mobility comments: Pt initiating LLE movement, requiring hand over hand assist to place LUE on bed rail, maxA for scooting hips and trunk elevation    Transfers Overall transfer level: Needs assistance Equipment used: 1 person hand held assist Transfers: Sit to/from Stand Sit to Stand: Mod assist;+2 physical assistance Stand pivot transfers: Max assist;+2 physical assistance       General transfer comment: Pt able to stand with LUE HHA and modA+2 for trunk elevation and to achieve near full upright posture; dependent on R knee blocked. Pt attempting to take steps on LLE despite R knee instability requiring totalA to block, maxA+2 for maintaining balance with stand pivot  Ambulation/Gait                Stairs            Wheelchair Mobility    Modified Rankin (Stroke Patients Only)       Balance Overall balance assessment: Needs assistance Sitting-balance support: Single extremity supported;Feet supported Sitting balance-Leahy Scale: Poor Sitting balance - Comments: reliant on min-maxA external assist, stability improved with LUE support Postural control: Right lateral lean;Posterior lean Standing balance support: Bilateral upper extremity supported;During functional activity Standing balance-Leahy Scale: Zero                               Pertinent Vitals/Pain Pain Assessment: Faces Faces Pain Scale: No hurt Pain Intervention(s): Monitored during session    Home Living Family/patient expects to be discharged to:: Private residence Living  Arrangements: Spouse/significant other Available Help at Discharge: Family;Available 24  hours/day Type of Home: House Home Access: Stairs to enter Entrance Stairs-Rails: None Entrance Stairs-Number of Steps: 1 Home Layout: Two level;Able to live on main level with bedroom/bathroom Home Equipment: Toilet riser;Walker - 2 wheels;Shower seat;Grab bars - tub/shower;Wheelchair - manual;Cane - single point Additional Comments: Recent admission for stroke with d/c to [facility] 12/06/20. Husband reports unsure if pt to return home, or if able to come home with hired caregivers    Prior Function Level of Independence: Needs assistance   Gait / Transfers Assistance Needed: Prior to stroke 11/13/20, pt independent without DME. Since stroke, has been at rehab since 12/06/20 with R-side hemiplegia and aphasia, assist for standing and transfers  ADL's / Homemaking Assistance Needed: Prior to stroke 11/13/20, pt independent with ADLs/iADLs (manages her own finances and medicines without assist). Since stroke, has been at rehab since 12/06/20 with R-side hemiplegia and aphasia, requires assist from staff for all ADLs        Hand Dominance   Dominant Hand: Right    Extremity/Trunk Assessment   Upper Extremity Assessment Upper Extremity Assessment: RUE deficits/detail;LUE deficits/detail;Difficult to assess due to impaired cognition RUE Deficits / Details: R-side inattention (difficult to determine extent), pt will make some effort to hold R hand with LUE with hand-over-hand assist, but not with verbal cues; no active movement noted in RUE; pt with good passive ROM LUE Deficits / Details: Suspect at least 3/5 functional strength in LUE; pt engaging in task to wash face with L hand; inconsistent purposeful movements LUE Coordination: decreased fine motor    Lower Extremity Assessment Lower Extremity Assessment: RLE deficits/detail;LLE deficits/detail;Difficult to assess due to impaired cognition RLE Deficits / Details: No active movement noted in RLE; good passive ROM, but noted calf/ankle  tightness due to resting in PF LLE Deficits / Details: Generalized weakness; able to accept weight on LLE in standing without knee buckling, attempting to hop on L foot despite R knee buckling    Cervical / Trunk Assessment Cervical / Trunk Assessment: Kyphotic;Other exceptions Cervical / Trunk Exceptions: Sits with trunk rotated and laterally flexed to R; noted cervical tightness and decreased trunk control  Communication   Communication: Receptive difficulties;Expressive difficulties  Cognition Arousal/Alertness: Awake/alert Behavior During Therapy: Flat affect Overall Cognitive Status: Difficult to assess Area of Impairment: Following commands;Awareness;Problem solving;Safety/judgement;Attention                   Current Attention Level: Sustained;Focused   Following Commands: Follows one step commands inconsistently;Follows one step commands with increased time Safety/Judgement: Decreased awareness of safety;Decreased awareness of deficits Awareness: Intellectual Problem Solving: Slow processing;Difficulty sequencing;Decreased initiation;Requires verbal cues;Requires tactile cues General Comments: Pt making and maintaining eye contact on both sides; follows one step commands inconsistently with L-side. Seems to make appropriate facial expressions in response to conversation, inconsistent. Able to give thumbs up, but unsure if pt appropriately relating it to yes/no questions. Husband present and reports they have yet to find a way to communicate consistently/effectively      General Comments General comments (skin integrity, edema, etc.): SpO2 down to 86% on RA, replaced 2L O2. Pt's husband present and supportive; family considering having pt d/c home with hired support instead of returning to SNF, but unsure    Exercises     Assessment/Plan    PT Assessment Patient needs continued PT services  PT Problem List Decreased strength;Decreased activity tolerance;Decreased  balance;Decreased mobility;Decreased coordination;Decreased cognition;Decreased knowledge of use of  DME;Decreased safety awareness;Impaired sensation;Decreased range of motion;Cardiopulmonary status limiting activity;Impaired tone;Decreased skin integrity       PT Treatment Interventions DME instruction;Gait training;Therapeutic activities;Functional mobility training;Therapeutic exercise;Balance training;Neuromuscular re-education;Cognitive remediation;Patient/family education;Wheelchair mobility training    PT Goals (Current goals can be found in the Care Plan section)  Acute Rehab PT Goals Patient Stated Goal: Family unsure if pt to return to SNF for rehab, or home with 24/7 support PT Goal Formulation: With family Time For Goal Achievement: 12/27/20 Potential to Achieve Goals: Fair    Frequency Min 4X/week   Barriers to discharge        Co-evaluation               AM-PAC PT "6 Clicks" Mobility  Outcome Measure Help needed turning from your back to your side while in a flat bed without using bedrails?: A Lot Help needed moving from lying on your back to sitting on the side of a flat bed without using bedrails?: A Lot Help needed moving to and from a bed to a chair (including a wheelchair)?: Total Help needed standing up from a chair using your arms (e.g., wheelchair or bedside chair)?: Total Help needed to walk in hospital room?: Total Help needed climbing 3-5 steps with a railing? : Total 6 Click Score: 8    End of Session Equipment Utilized During Treatment: Gait belt Activity Tolerance: Patient tolerated treatment well Patient left: in chair;with call bell/phone within reach;with chair alarm set;with family/visitor present Nurse Communication: Mobility status;Other (comment) (rehab to assist with transfer back to bed) PT Visit Diagnosis: Muscle weakness (generalized) (M62.81);Difficulty in walking, not elsewhere classified (R26.2);Other symptoms and signs involving the  nervous system (R29.898);Hemiplegia and hemiparesis Hemiplegia - Right/Left: Right Hemiplegia - dominant/non-dominant: Dominant Hemiplegia - caused by: Cerebral infarction    Time: 7989-2119 PT Time Calculation (min) (ACUTE ONLY): 44 min   Charges:   PT Evaluation $PT Eval Moderate Complexity: 1 Mod PT Treatments $Therapeutic Activity: 23-37 mins      Ina Homes, PT, DPT Acute Rehabilitation Services  Pager [phone] Office [phone]  Malachy Chamber 12/13/2020, 12:53 PM

## 2020-12-13 NOTE — Progress Notes (Signed)
Inpatient Rehab Admissions Coordinator Note:   Per PT recommendations patient was screened for CIR candidacy by Stephania Fragmin, PT. At this time, pt appears to be a potential candidate for CIR. I will place an order for rehab consult for full assessment, per our protocol.  Please contact me any with questions.Estill Dooms, PT, DPT [phone] 12/13/20 2:02 PM

## 2020-12-13 NOTE — Progress Notes (Signed)
PROGRESS NOTE    Beverly Li  MWU:[id number] DOB: October 10, 1945 DOA: 12/12/2020 PCP: Clayborn Heron, MD  Brief Narrative:Beverly Li is a 75 y.o. 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 -She is currently at [facility], when her husband left her side on 8/29 she had been doing well and had been working with speech therapy and taking sips of sugar water.  However, once he returned 8/30 morning noted that the patient was having labored breathing.  She had not been on any other oral foods and had been receiving all nutrition through her PEG tube.   -In the emergency room she was febrile to 101.3, tachypneic, tachycardic and hypoxic requiring 4 L of O2, WBC was 19 K, chest x-ray noted right pleural effusion with worsening right basilar opacity   Assessment & Plan:  Aspiration pneumonia Sepsis, poa  -She has multiple antibiotic allergies, currently on IV Rocephin and doxycycline -Change doxycycline to Flagyl -Continue nutrition and meds via PEG tube -SLP to reassess tomorrow -Follow-up blood cultures  Acute respiratory failure with hypoxia  -COPD, without exacerbation:  -Previously discharged to SNF on 1 L O2, currently on 3 L  -Chest x-ray and previous imaging noted severe emphysema, now with aspiration pneumonia  -As above    Paroxysmal atrial fibrillation  -Continue Eliquis, metoprolol, and amiodarone   Diastolic CHF: Acute on chronic.   - noted to have worsening right-sided pleural effusion.  BNP elevated at 970.9.  Last echocardiogram revealed EF of 60 - 65%.   -Require diuretics last admission as well, given IV Lasix x1 yesterday -Reassess need for diuretics daily   Essential hypertension:  -Continued hydralazine, decrease dose  and metoprolol 12.5 mg twice daily.   Recent left MCA stroke with right hemiplegia, aphasia, dysphagia  -Patient had been receiving therapies at [facility] after left MCA stroke status post thrombectomy. -PT/OT/speech to evaluate and treat   Dysphagia: Secondary to recent stroke PEG placed on 8/19. -Continue tube feeds with free water flushes   Anxiety -Continue Wellbutrin   Right femoral popliteal bypass graft stenosis -Patient previously recommended to follow-up with vascular surgery in 2 to 3 months   GERD -Continue protonix   DVT prophylaxis: Eliquis Code Status: Full Family Communication: Husband updated at bedside Disposition Plan:  Status is: Inpatient  Remains inpatient appropriate because:Inpatient level of care appropriate due to severity of illness  Dispo: The patient is from: SNF              Anticipated d/c is to: SNF              Patient currently is not medically stable to d/c.   Difficult to place patient No    Procedures:   Antimicrobials:    Subjective: -Looks better today per spouse at bedside, no events  Objective: Vitals:   12/13/20 0745 12/13/20 0759 12/13/20 0814 12/13/20 0851  BP:   100/69 110/72  Pulse:   83 83  Resp:   14   Temp:      TempSrc:      SpO2: 96% 97% 93%   Weight:      Height:        Intake/Output Summary (Last 24 hours) at 12/13/2020 1142 Last data filed at 12/13/2020 0644 Gross per 24 hour  Intake 653.7  ml  Output 200 ml  Net 453.7 ml   Filed Weights   12/12/20 1303 12/13/20 0446  Weight: 53 kg 51.9 kg    Examination:  General exam: Pleasant elderly female, laying in bed, flat affect, awake alert, global aphasia CVS: S1-S2, regular rate rhythm Lungs: Decreased breath sounds at both bases Abdomen: Soft, nontender, bowel sounds present, PEG tube noted Extremities: No edema Neuro: Aphasia and right hemiplegia  Skin: No rashes on exposed skin Psychiatry: Unable to assess    Data Reviewed:   CBC: Recent  Labs  Lab 12/12/20 1201 12/13/20 0240  WBC 19.0* 12.0*  NEUTROABS 17.2* 10.9*  HGB 14.5 12.7  HCT 45.8 39.8  MCV 100.0 99.5  PLT 227 206   Basic Metabolic Panel: Recent Labs  Lab 12/12/20 1201 12/13/20 0240  NA 138 137  K 4.6 4.0  CL 101 98  CO2 29 29  GLUCOSE 117* 116*  BUN 34* 39*  CREATININE 0.68 0.74  CALCIUM 8.9 8.5*   GFR: Estimated Creatinine Clearance: 49.8 mL/min (by C-G formula based on SCr of 0.74 mg/dL). Liver Function Tests: Recent Labs  Lab 12/12/20 1201  AST 35  ALT 48*  ALKPHOS 83  BILITOT 1.3*  PROT 6.5  ALBUMIN 2.9*   No results for input(s): LIPASE, AMYLASE in the last 168 hours. No results for input(s): AMMONIA in the last 168 hours. Coagulation Profile: Recent Labs  Lab 12/12/20 1249  INR 1.7*   Cardiac Enzymes: No results for input(s): CKTOTAL, CKMB, CKMBINDEX, TROPONINI in the last 168 hours. BNP (last 3 results) No results for input(s): PROBNP in the last 8760 hours. HbA1C: No results for input(s): HGBA1C in the last 72 hours. CBG: Recent Labs  Lab 12/12/20 2018 12/12/20 2339 12/13/20 0502 12/13/20 0732 12/13/20 1139  GLUCAP 109* 128* 152* 183* 117*   Lipid Profile: No results for input(s): CHOL, HDL, LDLCALC, TRIG, CHOLHDL, LDLDIRECT in the last 72 hours. Thyroid Function Tests: No results for input(s): TSH, T4TOTAL, FREET4, T3FREE, THYROIDAB in the last 72 hours. Anemia Panel: No results for input(s): VITAMINB12, FOLATE, FERRITIN, TIBC, IRON, RETICCTPCT in the last 72 hours. Urine analysis:    Component Value Date/Time   COLORURINE AMBER (A) 12/12/2020 2043   APPEARANCEUR HAZY (A) 12/12/2020 2043   LABSPEC 1.020 12/12/2020 2043   PHURINE 6.0 12/12/2020 2043   GLUCOSEU NEGATIVE 12/12/2020 2043   HGBUR NEGATIVE 12/12/2020 2043   BILIRUBINUR NEGATIVE 12/12/2020 2043   KETONESUR NEGATIVE 12/12/2020 2043   PROTEINUR 100 (A) 12/12/2020 2043   UROBILINOGEN 1.0 02/27/2015 1127   NITRITE NEGATIVE 12/12/2020 2043    LEUKOCYTESUR SMALL (A) 12/12/2020 2043   Sepsis Labs: @LABRCNTIP (procalcitonin:4,lacticidven:4)  ) Recent Results (from the past 240 hour(s))  SARS CORONAVIRUS 2 (TAT 6-24 HRS) Nasopharyngeal Nasopharyngeal Swab     Status: None   Collection Time: 12/05/20  4:58 PM   Specimen: Nasopharyngeal Swab  Result Value Ref Range Status   SARS Coronavirus 2 NEGATIVE NEGATIVE Final    Comment: (NOTE) SARS-CoV-2 target nucleic acids are NOT DETECTED.  The SARS-CoV-2 RNA is generally detectable in upper and lower respiratory specimens during the acute phase of infection. Negative results do not preclude SARS-CoV-2 infection, do not rule out co-infections with other pathogens, and should not be used as the sole basis for treatment or other patient management decisions. Negative results must be combined with clinical observations, patient history, and epidemiological information. The expected result is Negative.  Fact Sheet for Patients: 12/07/20  Fact Sheet for Healthcare Providers: HairSlick.no  This test is not yet approved or cleared by the [place] and  has been authorized for detection and/or diagnosis of SARS-CoV-2 by FDA under an Emergency Use Authorization (EUA). This EUA will remain  in effect (meaning this test can be used) for the duration of the COVID-19 declaration under Se ction 564(b)(1) of the Act, 21 U.S.C. section 360bbb-3(b)(1), unless the authorization is terminated or revoked sooner.  Performed at [facility], [address] [place], [place] [address]   Resp Panel by RT-PCR (Flu A&B, Covid) Nasopharyngeal Swab     Status: None   Collection Time: 12/12/20 11:39 AM   Specimen: Nasopharyngeal Swab; Nasopharyngeal(NP) swabs in vial transport medium  Result Value Ref Range Status   SARS Coronavirus 2 by RT PCR NEGATIVE NEGATIVE Final    Comment: (NOTE) SARS-CoV-2 target nucleic acids are  NOT DETECTED.  The SARS-CoV-2 RNA is generally detectable in upper respiratory specimens during the acute phase of infection. The lowest concentration of SARS-CoV-2 viral copies this assay can detect is 138 copies/mL. A negative result does not preclude SARS-Cov-2 infection and should not be used as the sole basis for treatment or other patient management decisions. A negative result may occur with  improper specimen collection/handling, submission of specimen other than nasopharyngeal swab, presence of viral mutation(s) within the areas targeted by this assay, and inadequate number of viral copies(<138 copies/mL). A negative result must be combined with clinical observations, patient history, and epidemiological information. The expected result is Negative.  Fact Sheet for Patients:  BloggerCourse.com  Fact Sheet for Healthcare Providers:  SeriousBroker.it  This test is no t yet approved or cleared by the [place] FDA and  has been authorized for detection and/or diagnosis of SARS-CoV-2 by FDA under an Emergency Use Authorization (EUA). This EUA will remain  in effect (meaning this test can be used) for the duration of the COVID-19 declaration under Section 564(b)(1) of the Act, 21 U.S.C.section 360bbb-3(b)(1), unless the authorization is terminated  or revoked sooner.       Influenza A by PCR NEGATIVE NEGATIVE Final   Influenza B by PCR NEGATIVE NEGATIVE Final    Comment: (NOTE) The Xpert Xpress SARS-CoV-2/FLU/RSV plus assay is intended as an aid in the diagnosis of influenza from Nasopharyngeal swab specimens and should not be used as a sole basis for treatment. Nasal washings and aspirates are unacceptable for Xpert Xpress SARS-CoV-2/FLU/RSV testing.  Fact Sheet for Patients: BloggerCourse.com  Fact Sheet for Healthcare Providers: SeriousBroker.it  This test is not  yet approved or cleared by the [place] FDA and has been authorized for detection and/or diagnosis of SARS-CoV-2 by FDA under an Emergency Use Authorization (EUA). This EUA will remain in effect (meaning this test can be used) for the duration of the COVID-19 declaration under Section 564(b)(1) of the Act, 21 U.S.C. section 360bbb-3(b)(1), unless the authorization is terminated or revoked.  Performed at [facility], [address] [place], [place] [address]   Blood Culture (routine x 2)     Status: None (Preliminary result)   Collection Time: 12/12/20 12:49 PM   Specimen: BLOOD  Result Value Ref Range Status   Specimen Description BLOOD SITE NOT SPECIFIED  Final   Special Requests   Final    BOTTLES DRAWN AEROBIC AND ANAEROBIC Blood Culture results may not be optimal due to an inadequate volume of blood received in culture bottles   Culture   Final    NO GROWTH < 24 HOURS  Performed at [facility], [address] [place], [place] [address]    Report Status PENDING  Incomplete  Blood Culture (routine x 2)     Status: None (Preliminary result)   Collection Time: 12/12/20 12:58 PM   Specimen: BLOOD  Result Value Ref Range Status   Specimen Description BLOOD RIGHT ANTECUBITAL  Final   Special Requests   Final    BOTTLES DRAWN AEROBIC AND ANAEROBIC Blood Culture adequate volume   Culture   Final    NO GROWTH < 24 HOURS Performed at [facility], [address] [place], [place] [address]    Report Status PENDING  Incomplete     Radiology Studies: DG Chest Portable 1 View  Result Date: 12/12/2020 CLINICAL DATA:  Dyspnea, respiratory distress EXAM: PORTABLE CHEST 1 VIEW COMPARISON:  Chest radiograph 12/01/2020 FINDINGS: The cardiomediastinal silhouette is stable. There is confluent opacity in the right base which has increased in conspicuity since the prior study. Retrocardiac consolidation is similar to the prior study. There are bilateral pleural  effusions; hazy opacity in the right lower lung likely reflects layering fluid. The effusion appears increased in size on the right. The upper lungs remain well aerated. There is no pneumothorax. The bones are stable. IMPRESSION: 1. Increased right pleural effusion with worsening opacity in the right base; ongoing/worsening infection can not be excluded. 2. Overall unchanged left pleural effusion with adjacent consolidation which may reflect infection or atelectasis. Electronically Signed   By: Lesia HausenPeter  Noone M.D.   On: 12/12/2020 12:35        Scheduled Meds:  amiodarone  200 mg Per Tube Daily   apixaban  5 mg Per Tube BID   arformoterol  15 mcg Nebulization BID   budesonide  0.5 mg Nebulization BID   buPROPion  75 mg Per Tube BID   ezetimibe  10 mg Per Tube Daily   feeding supplement (PROSource TF)  45 mL Per Tube BID   free water  200 mL Per Tube Q4H   hydrALAZINE  50 mg Per Tube BID   insulin aspart  0-9 Units Subcutaneous Q4H   metoprolol tartrate  12.5 mg Per Tube BID   pantoprazole sodium  40 mg Per Tube QHS   revefenacin  175 mcg Nebulization Daily   Continuous Infusions:  cefTRIAXone (ROCEPHIN)  IV Stopped (12/12/20 1456)   doxycycline (VIBRAMYCIN) IV 100 mg (12/13/20 0912)   feeding supplement (JEVITY 1.2 CAL) 55 mL/hr (12/13/20 0122)     LOS: 1 day    Time spent: 35min  Zannie CovePreetha Kumiko Fishman, MD [facility]   12/13/2020, 11:42 AM

## 2020-12-14 LAB — CBC
HCT: 38.8 % (ref 36.0–46.0)
Hemoglobin: 12.1 g/dL (ref 12.0–15.0)
MCH: 31 pg (ref 26.0–34.0)
MCHC: 31.2 g/dL (ref 30.0–36.0)
MCV: 99.5 fL (ref 80.0–100.0)
Platelets: 214 10*3/uL (ref 150–400)
RBC: 3.9 MIL/uL (ref 3.87–5.11)
RDW: 17.7 % — ABNORMAL HIGH (ref 11.5–15.5)
WBC: 13.1 10*3/uL — ABNORMAL HIGH (ref 4.0–10.5)
nRBC: 0 % (ref 0.0–0.2)

## 2020-12-14 LAB — BASIC METABOLIC PANEL
Anion gap: 8 (ref 5–15)
BUN: 43 mg/dL — ABNORMAL HIGH (ref 8–23)
CO2: 28 mmol/L (ref 22–32)
Calcium: 8.2 mg/dL — ABNORMAL LOW (ref 8.9–10.3)
Chloride: 99 mmol/L (ref 98–111)
Creatinine, Ser: 0.62 mg/dL (ref 0.44–1.00)
GFR, Estimated: >60 mL/min (ref >60)
Glucose, Bld: 109 mg/dL — ABNORMAL HIGH (ref 70–99)
Potassium: 3.8 mmol/L (ref 3.5–5.1)
Sodium: 135 mmol/L (ref 135–145)

## 2020-12-14 LAB — GLUCOSE, CAPILLARY
Glucose-Capillary: 104 mg/dL — ABNORMAL HIGH (ref 70–99)
Glucose-Capillary: 120 mg/dL — ABNORMAL HIGH (ref 70–99)
Glucose-Capillary: 126 mg/dL — ABNORMAL HIGH (ref 70–99)
Glucose-Capillary: 135 mg/dL — ABNORMAL HIGH (ref 70–99)
Glucose-Capillary: 138 mg/dL — ABNORMAL HIGH (ref 70–99)
Glucose-Capillary: 141 mg/dL — ABNORMAL HIGH (ref 70–99)
Glucose-Capillary: 83 mg/dL (ref 70–99)

## 2020-12-14 LAB — LEGIONELLA PNEUMOPHILA SEROGP 1 UR AG: L. pneumophila Serogp 1 Ur Ag: NEGATIVE

## 2020-12-14 MED ORDER — FUROSEMIDE 20 MG PO TABS
20.0000 mg | ORAL_TABLET | Freq: Every day | ORAL | Status: DC
  Administered 2020-12-14 – 2020-12-17 (×4): 20 mg
  Filled 2020-12-14 (×4): qty 1

## 2020-12-14 MED ORDER — ORAL CARE MOUTH RINSE
15.0000 mL | Freq: Two times a day (BID) | OROMUCOSAL | Status: DC
  Administered 2020-12-14 – 2020-12-21 (×14): 15 mL via OROMUCOSAL

## 2020-12-14 MED ORDER — FLUCONAZOLE 40 MG/ML PO SUSR
100.0000 mg | Freq: Once | ORAL | Status: AC
  Administered 2020-12-14: 100 mg
  Filled 2020-12-14: qty 2.5

## 2020-12-14 MED ORDER — POTASSIUM CHLORIDE 20 MEQ PO PACK
20.0000 meq | PACK | Freq: Every day | ORAL | Status: DC
  Administered 2020-12-14 – 2020-12-20 (×7): 20 meq
  Filled 2020-12-14 (×8): qty 1

## 2020-12-14 MED ORDER — IPRATROPIUM-ALBUTEROL 0.5-2.5 (3) MG/3ML IN SOLN: 3.0000 mL | RESPIRATORY_TRACT | Status: DC | PRN

## 2020-12-14 NOTE — Progress Notes (Addendum)
PROGRESS NOTE    Beverly Li  QAS:[id number] DOB: 10-11-1945 DOA: 12/12/2020 PCP: Clayborn Heron, MD  Brief Narrative:Beverly Li is a 75 y.o. 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 -She is currently at [facility], when her husband left her side on 8/29 she had been doing well and had been working with speech therapy and taking sips of sugar water.  However, once he returned 8/30 morning noted that the patient was having labored breathing.  She had not been on any other oral foods and had been receiving all nutrition through her PEG tube.   -In the emergency room she was febrile to 101.3, tachypneic, tachycardic and hypoxic requiring 4 L of O2, WBC was 19 K, chest x-ray noted right pleural effusion with worsening right basilar opacity   Assessment & Plan:  Aspiration pneumonia Sepsis, poa  -She has multiple antibiotic allergies,  -Clinically improving, on ceftriaxone and Flagyl -Continue nutrition and meds via PEG tube -SLP assessment appreciated -Blood cultures are negative -PT OT eval completed, CIR recommended  Acute respiratory failure with hypoxia  -COPD, without exacerbation:  -Previously discharged to SNF on 1 L O2, currently on 3 L  -Chest x-ray and previous imaging noted severe emphysema, now with aspiration pneumonia  -As above    Paroxysmal atrial fibrillation  -Continue Eliquis, metoprolol, and amiodarone   Diastolic CHF: Acute on chronic.   - noted to have worsening right-sided pleural effusion.  BNP elevated at 970.9.  Last echocardiogram revealed EF of 60 - 65%.   -Require diuretics last admission as well, given IV Lasix x1 on admission -Add Lasix 20 mg daily   Essential hypertension:  -Blood pressure is  softer, discontinued hydralazine, continue metoprolol 12.5 mg twice daily.   Recent left MCA stroke with right hemiplegia, aphasia, dysphagia  -Patient had been receiving therapies at [place] after left MCA stroke status post thrombectomy. -PT/OT/speech to evaluate and treat  Abnormal UA and Cultures -unable to assess symptoms, improving on Abx for Asp PNA, monitor clinically   Dysphagia: Secondary to recent stroke PEG placed on 8/19. -Continue tube feeds with free water flushes   Anxiety -Continue Wellbutrin   Right femoral popliteal bypass graft stenosis -Patient previously recommended to follow-up with vascular surgery in 2 to 3 months   GERD -Continue protonix   DVT prophylaxis: Eliquis Code Status: Full Family Communication: Husband updated at bedside Disposition Plan:  Status is: Inpatient  Remains inpatient appropriate because:Inpatient level of care appropriate due to severity of illness  Dispo: The patient is from: SNF              Anticipated d/c is to: SNF versus CIR              Patient currently is not medically stable to d/c.   Difficult to place patient No    Procedures:   Antimicrobials:    Subjective: -No events overnight, worked with speech therapy yesterday  Objective: Vitals:   12/14/20 0844 12/14/20 0846 12/14/20 0908 12/14/20 1110  BP:   108/74 107/70  Pulse:   78 76  Resp:   15 19  Temp:    97.7 F (36.5 C)  TempSrc:    Oral  SpO2: 98% 97% 96% 99%  Weight:      Height:  Intake/Output Summary (Last 24 hours) at 12/14/2020 1340 Last data filed at 12/14/2020 0847 Gross per 24 hour  Intake 1434.9 ml  Output 2950 ml  Net -1515.1 ml   Filed Weights   12/12/20 1303 12/13/20 0446 12/14/20 0410  Weight: 53 kg 51.9 kg 52.8 kg    Examination:  General exam: Pleasant elderly female laying in bed, flat affect, awake alert, global aphasia CVS: S1-S2, regular rate rhythm Lungs: Decreased breath sounds both bases Abdomen: Soft,  nontender, bowel sounds present, PEG tube noted Extremities: No edema  Neuro: Aphasia and right hemiplegia  Skin: No rashes on exposed skin Psychiatry: Unable to assess    Data Reviewed:   CBC: Recent Labs  Lab 12/12/20 1201 12/13/20 0240 12/14/20 0250  WBC 19.0* 12.0* 13.1*  NEUTROABS 17.2* 10.9*  --   HGB 14.5 12.7 12.1  HCT 45.8 39.8 38.8  MCV 100.0 99.5 99.5  PLT 227 206 214   Basic Metabolic Panel: Recent Labs  Lab 12/12/20 1201 12/13/20 0240 12/14/20 0250  NA 138 137 135  K 4.6 4.0 3.8  CL 101 98 99  CO2 29 29 28   GLUCOSE 117* 116* 109*  BUN 34* 39* 43*  CREATININE 0.68 0.74 0.62  CALCIUM 8.9 8.5* 8.2*   GFR: Estimated Creatinine Clearance: 50.3 mL/min (by C-G formula based on SCr of 0.62 mg/dL). Liver Function Tests: Recent Labs  Lab 12/12/20 1201  AST 35  ALT 48*  ALKPHOS 83  BILITOT 1.3*  PROT 6.5  ALBUMIN 2.9*   No results for input(s): LIPASE, AMYLASE in the last 168 hours. No results for input(s): AMMONIA in the last 168 hours. Coagulation Profile: Recent Labs  Lab 12/12/20 1249  INR 1.7*   Cardiac Enzymes: No results for input(s): CKTOTAL, CKMB, CKMBINDEX, TROPONINI in the last 168 hours. BNP (last 3 results) No results for input(s): PROBNP in the last 8760 hours. HbA1C: No results for input(s): HGBA1C in the last 72 hours. CBG: Recent Labs  Lab 12/14/20 0028 12/14/20 0409 12/14/20 0724 12/14/20 0903 12/14/20 1109  GLUCAP 83 126* 135* 141* 120*   Lipid Profile: No results for input(s): CHOL, HDL, LDLCALC, TRIG, CHOLHDL, LDLDIRECT in the last 72 hours. Thyroid Function Tests: No results for input(s): TSH, T4TOTAL, FREET4, T3FREE, THYROIDAB in the last 72 hours. Anemia Panel: No results for input(s): VITAMINB12, FOLATE, FERRITIN, TIBC, IRON, RETICCTPCT in the last 72 hours. Urine analysis:    Component Value Date/Time   COLORURINE AMBER (A) 12/12/2020 2043   APPEARANCEUR HAZY (A) 12/12/2020 2043   LABSPEC 1.020  12/12/2020 2043   PHURINE 6.0 12/12/2020 2043   GLUCOSEU NEGATIVE 12/12/2020 2043   HGBUR NEGATIVE 12/12/2020 2043   BILIRUBINUR NEGATIVE 12/12/2020 2043   KETONESUR NEGATIVE 12/12/2020 2043   PROTEINUR 100 (A) 12/12/2020 2043   UROBILINOGEN 1.0 02/27/2015 1127   NITRITE NEGATIVE 12/12/2020 2043   LEUKOCYTESUR SMALL (A) 12/12/2020 2043   Sepsis Labs: @LABRCNTIP (procalcitonin:4,lacticidven:4)  ) Recent Results (from the past 240 hour(s))  SARS CORONAVIRUS 2 (TAT 6-24 HRS) Nasopharyngeal Nasopharyngeal Swab     Status: None   Collection Time: 12/05/20  4:58 PM   Specimen: Nasopharyngeal Swab  Result Value Ref Range Status   SARS Coronavirus 2 NEGATIVE NEGATIVE Final    Comment: (NOTE) SARS-CoV-2 target nucleic acids are NOT DETECTED.  The SARS-CoV-2 RNA is generally detectable in upper and lower respiratory specimens during the acute phase of infection. Negative results do not preclude SARS-CoV-2 infection, do not rule out co-infections with other pathogens, and should  not be used as the sole basis for treatment or other patient management decisions. Negative results must be combined with clinical observations, patient history, and epidemiological information. The expected result is Negative.  Fact Sheet for Patients: HairSlick.no  Fact Sheet for Healthcare Providers: quierodirigir.com  This test is not yet approved or cleared by the [place] FDA and  has been authorized for detection and/or diagnosis of SARS-CoV-2 by FDA under an Emergency Use Authorization (EUA). This EUA will remain  in effect (meaning this test can be used) for the duration of the COVID-19 declaration under Se ction 564(b)(1) of the Act, 21 U.S.C. section 360bbb-3(b)(1), unless the authorization is terminated or revoked sooner.  Performed at [facility], [address], [place], [place] [address]   Resp Panel by RT-PCR (Flu A&B,  Covid) Nasopharyngeal Swab     Status: None   Collection Time: 12/12/20 11:39 AM   Specimen: Nasopharyngeal Swab; Nasopharyngeal(NP) swabs in vial transport medium  Result Value Ref Range Status   SARS Coronavirus 2 by RT PCR NEGATIVE NEGATIVE Final    Comment: (NOTE) SARS-CoV-2 target nucleic acids are NOT DETECTED.  The SARS-CoV-2 RNA is generally detectable in upper respiratory specimens during the acute phase of infection. The lowest concentration of SARS-CoV-2 viral copies this assay can detect is 138 copies/mL. A negative result does not preclude SARS-Cov-2 infection and should not be used as the sole basis for treatment or other patient management decisions. A negative result may occur with  improper specimen collection/handling, submission of specimen other than nasopharyngeal swab, presence of viral mutation(s) within the areas targeted by this assay, and inadequate number of viral copies(<138 copies/mL). A negative result must be combined with clinical observations, patient history, and epidemiological information. The expected result is Negative.  Fact Sheet for Patients:  BloggerCourse.com  Fact Sheet for Healthcare Providers:  SeriousBroker.it  This test is no t yet approved or cleared by the [place] FDA and  has been authorized for detection and/or diagnosis of SARS-CoV-2 by FDA under an Emergency Use Authorization (EUA). This EUA will remain  in effect (meaning this test can be used) for the duration of the COVID-19 declaration under Section 564(b)(1) of the Act, 21 U.S.C.section 360bbb-3(b)(1), unless the authorization is terminated  or revoked sooner.       Influenza A by PCR NEGATIVE NEGATIVE Final   Influenza B by PCR NEGATIVE NEGATIVE Final    Comment: (NOTE) The Xpert Xpress SARS-CoV-2/FLU/RSV plus assay is intended as an aid in the diagnosis of influenza from Nasopharyngeal swab specimens and should  not be used as a sole basis for treatment. Nasal washings and aspirates are unacceptable for Xpert Xpress SARS-CoV-2/FLU/RSV testing.  Fact Sheet for Patients: BloggerCourse.com  Fact Sheet for Healthcare Providers: SeriousBroker.it  This test is not yet approved or cleared by the [place] FDA and has been authorized for detection and/or diagnosis of SARS-CoV-2 by FDA under an Emergency Use Authorization (EUA). This EUA will remain in effect (meaning this test can be used) for the duration of the COVID-19 declaration under Section 564(b)(1) of the Act, 21 U.S.C. section 360bbb-3(b)(1), unless the authorization is terminated or revoked.  Performed at [facility], [address] [place], [place] [address]   Blood Culture (routine x 2)     Status: None (Preliminary result)   Collection Time: 12/12/20 12:49 PM   Specimen: BLOOD  Result Value Ref Range Status   Specimen Description BLOOD SITE NOT SPECIFIED  Final  Special Requests   Final    BOTTLES DRAWN AEROBIC AND ANAEROBIC Blood Culture results may not be optimal due to an inadequate volume of blood received in culture bottles   Culture   Final    NO GROWTH 2 DAYS Performed at [facility], [address] [place], [place] [address]    Report Status PENDING  Incomplete  Blood Culture (routine x 2)     Status: None (Preliminary result)   Collection Time: 12/12/20 12:58 PM   Specimen: BLOOD  Result Value Ref Range Status   Specimen Description BLOOD RIGHT ANTECUBITAL  Final   Special Requests   Final    BOTTLES DRAWN AEROBIC AND ANAEROBIC Blood Culture adequate volume   Culture   Final    NO GROWTH 2 DAYS Performed at [facility], [address] [place], [place] [address]    Report Status PENDING  Incomplete  Urine Culture     Status: Abnormal (Preliminary result)   Collection Time: 12/12/20  8:43 PM   Specimen: In/Out Cath Urine  Result Value  Ref Range Status   Specimen Description IN/OUT CATH URINE  Final   Special Requests NONE  Final   Culture (A)  Final    >=100,000 COLONIES/mL ENTEROCOCCUS FAECALIS 10,000 COLONIES/mL PSEUDOMONAS AERUGINOSA SUSCEPTIBILITIES TO FOLLOW Performed at [facility], [address] [place], [place] [address]    Report Status PENDING  Incomplete     Radiology Studies: No results found.  Scheduled Meds:  amiodarone  200 mg Per Tube Daily   apixaban  5 mg Per Tube BID   arformoterol  15 mcg Nebulization BID   budesonide  0.5 mg Nebulization BID   buPROPion  75 mg Per Tube BID   ezetimibe  10 mg Per Tube Daily   feeding supplement (PROSource TF)  45 mL Per Tube BID   free water  200 mL Per Tube Q4H   furosemide  20 mg Per Tube Daily   insulin aspart  0-9 Units Subcutaneous Q4H   metoprolol tartrate  12.5 mg Per Tube BID   pantoprazole sodium  40 mg Per Tube QHS   potassium chloride  20 mEq Per Tube Daily   revefenacin  175 mcg Nebulization Daily   Continuous Infusions:  cefTRIAXone (ROCEPHIN)  IV 2 g (12/13/20 1610)   feeding supplement (JEVITY 1.2 CAL) 55 mL/hr (12/13/20 1800)   metronidazole 500 mg (12/14/20 0945)     LOS: 2 days    Time spent: 25min  Zannie CovePreetha Meshawn Oconnor, MD [facility]   12/14/2020, 1:40 PM

## 2020-12-14 NOTE — Progress Notes (Signed)
Occupational Therapy Treatment Patient Details Name: Beverly Li MRN: [id number] DOB: [birth year] Today's Date: 12/14/2020    History of present illness 75 y.o. female with readmitted 12/12/20 with respiratory distress. Recently d/c to [facility] for rehab after admission 11/13/20-12/06/20 for multiple infarcts requiring revascularization and intubation. CXR noted increased R pleural effusion, unchanged L pleural effusion. Workup for sepsis secondary to PNA; question possibility of bacterial pneumonia versus aspiration with possibility of compounding factor of CHF exacerbation. 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   OT comments  Pt progressing towards established OT goals and very motivated to participate in therapy. This patient in known to this therapist from last admission; noting pt demonstrating increased activity tolerance and engagement compared with prior therapy sessions in last admission. Pt continue to present with decreased functional use of RUE, balance, strength, and cognition. However, pt more expressive and following commands with increased time. Pt performing sit<>stand with stedy requiring Min-Max A pending her fatigue. Pt also performing grooming task at sink with Min-Mod A. VSS on 2L. Due to pt's motivation, good family support, and presented progressions, update dc recommendation to CIR for intensive OT to optimize her safety and occupational performance as well as decease burden of care. Will continue to follow acutely as admitted.    Follow Up Recommendations  CIR    Equipment Recommendations  3 in 1 bedside commode;Wheelchair (measurements OT);Wheelchair cushion (measurements OT);Hospital bed    Recommendations for Other Services Rehab consult    Precautions / Restrictions  Precautions Precautions: Fall;Other (comment) Precaution Comments: R hemiplegia, PEG, adominal binder       Mobility Bed Mobility Overal bed mobility: Needs Assistance Bed Mobility: Supine to Sit     Supine to sit: Max assist;+2 for safety/equipment;HOB elevated     General bed mobility comments: Assistance to bring BLEs towards EOB and then elevate trunk. Initiating to push up with LUE    Transfers Overall transfer level: Needs assistance Equipment used: 1 person hand held assist Transfers: Sit to/from Stand Sit to Stand: Min assist;Max assist;+2 physical assistance         General transfer comment: Min A for power up from EOB. Pt then able to perform sit<>stand from stedy seat during peri care. As pt fatigues , she required Max A for power up    Balance Overall balance assessment: Needs assistance Sitting-balance support: Single extremity supported;Feet supported Sitting balance-Leahy Scale: Poor     Standing balance support: Bilateral upper extremity supported;During functional activity Standing balance-Leahy Scale: Zero                             ADL either performed or assessed with clinical judgement   ADL Overall ADL's : Needs assistance/impaired Eating/Feeding: NPO   Grooming: Sitting;Minimal assistance (sitting on stedy at sink) Grooming Details (indicate cue type and reason): Min A for intiating bringing wash clothe to face. Increased time for processing             Lower Body Dressing: Total assistance;+2 for physical assistance;Sit to/from stand Lower Body Dressing Details (indicate cue type and reason): don socks Toilet Transfer: Minimal assistance;Maximal assistance (sit<>stand with stedy at recliner) Toilet Transfer Details (indicate cue type and reason): Min A for power up from EOB. Pt then able to perform sit<>stand from stedy seat during peri care.  As pt fatigues , she required Max A for power up Toileting- Clothing Manipulation  and Hygiene: Maximal assistance;+2 for physical assistance Toileting - Clothing Manipulation Details (indicate cue type and reason): one person for maintaining standing and second person for peri care     Functional mobility during ADLs: Minimal assistance;Maximal assistance;+2 for physical assistance;+2 for safety/equipment General ADL Comments: Pt very motivated and demonstrating increased activity toelrance compared to last admission. Pt participating in peri care, grooming at sink, and then peri care again.     Vision   Vision Assessment?:  (difficult to assess.)   Perception     Praxis      Cognition Arousal/Alertness: Awake/alert Behavior During Therapy: Flat affect (more expressions) Overall Cognitive Status: Difficult to assess Area of Impairment: Following commands;Awareness;Problem solving;Safety/judgement                   Current Attention Level: Sustained;Focused   Following Commands: Follows one step commands inconsistently;Follows one step commands with increased time Safety/Judgement: Decreased awareness of safety;Decreased awareness of deficits Awareness: Intellectual Problem Solving: Slow processing;Difficulty sequencing;Decreased initiation;Requires verbal cues;Requires tactile cues General Comments: This patient in known to this therapist from last admission. Noting pt more expressive. Attempting to respond to conversational questions but still presenting with expressive aphasia. Presenitng with increased following of commands and very motivated. Cntinue to require increased time and cues        Exercises     Shoulder Instructions       General Comments VSS on 2L    Pertinent Vitals/ Pain       Pain Assessment: Faces Faces Pain Scale: No hurt Pain Location: generalized Pain Descriptors / Indicators: Grimacing Pain Intervention(s): Monitored during session  Home Living                                          Prior  Functioning/Environment              Frequency  Min 2X/week        Progress Toward Goals  OT Goals(current goals can now be found in the care plan section)  Progress towards OT goals: Progressing toward goals  Acute Rehab OT Goals Patient Stated Goal: Family unsure if pt to return to SNF for rehab, or home with 24/7 support OT Goal Formulation: With patient/family Time For Goal Achievement: 12/27/20 Potential to Achieve Goals: Fair ADL Goals Pt Will Perform Grooming: sitting;with min assist Pt Will Perform Upper Body Bathing: with min assist;sitting Pt Will Perform Upper Body Dressing: with mod assist;sitting Pt Will Transfer to Toilet: with mod assist;bedside commode;squat pivot transfer  Plan Discharge plan needs to be updated    Co-evaluation    PT/OT/SLP Co-Evaluation/Treatment: Yes Reason for Co-Treatment: For patient/therapist safety;To address functional/ADL transfers   OT goals addressed during session: ADL's and self-care      AM-PAC OT "6 Clicks" Daily Activity     Outcome Measure   Help from another person eating meals?: Total Help from another person taking care of personal grooming?: A Lot Help from another person toileting, which includes using toliet, bedpan, or urinal?: Total Help from another person bathing (including washing, rinsing, drying)?: Total Help from another person to put on and taking off regular upper body clothing?: Total Help from another person to put on and taking off regular lower body clothing?: Total 6 Click Score: 7    End  of Session Equipment Utilized During Treatment: Gait belt;Oxygen  OT Visit Diagnosis: Unsteadiness on feet (R26.81);Muscle weakness (generalized) (M62.81);Hemiplegia and hemiparesis Hemiplegia - Right/Left: Right Hemiplegia - dominant/non-dominant: Dominant Hemiplegia - caused by: Cerebral infarction   Activity Tolerance Patient tolerated treatment well   Patient Left with call bell/phone within  reach;in chair;with chair alarm set   Nurse Communication Mobility status        Time: 1359-1444 OT Time Calculation (min): 45 min  Charges: OT General Charges $OT Visit: 1 Visit OT Treatments $Self Care/Home Management : 23-37 mins  Sharlize Hoar MSOT, OTR/L Acute Rehab Pager: [phone] Office: [phone]   Theodoro Grist Offie Pickron 12/14/2020, 4:55 PM

## 2020-12-14 NOTE — Progress Notes (Signed)
  Speech Language Pathology Treatment: Dysphagia  Patient Details Name: Beverly Li MRN: [id number] DOB: [birth year] Today's Date: 12/14/2020 Time: 2563-8937 SLP Time Calculation (min) (ACUTE ONLY 32  Assessment / Plan / Recommendation Clinical Impression  Pt participated in her own oral care while sitting in recliner. She removed toothpaste cap, applied paste to brush, and brushed upper and lower teeth with moderate multimodal cues. She self-fed several bites of pudding with much improved oral manipulation/mastication with no residue post-swallow and no anterior spillage.  There were no s/s of aspiration.    If progress continues at current pace, pt may benefit from repeat MBS while still admitted. Will follow.    HPI HPI: Pt is a 75 y.o. female recently d/c to [facility] for rehab after admission 11/13/20-12/06/20 for multiple infarcts requiring revascularization and intubation, now readmitted 12/12/20 with respiratory distress. CXR noted increased R pleural effusion, unchanged L pleural effusion. Workup for sepsis secondary to PNA; question possibility of bacterial pneumonia versus aspiration with possibility of compounding factor of CHF exacerbation. 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 MBS 8/24 with dx of severe primarily oral dysphagia with adequate ability to protect airway with nectar-thick and puree - recommended therapeutic trials of puree/teaspoons of nectar with SLP>      SLP Plan  Continue with current plan of care  Patient needs continued Speech Lanaguage Pathology Services    Recommendations  Diet recommendations: NPO Medication Administration: Via alternative means                Oral Care Recommendations: Oral care QID Follow up Recommendations: Inpatient Rehab SLP Visit  Diagnosis: Dysphagia, oral phase (R13.11) Plan: Continue with current plan of care       GO               Karlene Southard L. Samson Frederic, MA CCC/SLP Acute Rehabilitation Services Office number [phone] Pager [phone]  [name] 12/14/2020, 5:09 PM

## 2020-12-14 NOTE — Progress Notes (Signed)
Inpatient Rehab Admissions Coordinator:   Met with patient at bedside to discuss CIR.  No family at bedside and I left a voicemail to discuss recommendations with her son.  Pt aphasic and unable to respond, but does track and attend to me at bedside.  Note family okay with proceeding with insurance auth, so I will start this today.   Shann Medal, PT, DPT Admissions Coordinator [phone] 12/14/20  12:27 PM

## 2020-12-14 NOTE — Evaluation (Addendum)
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   Encounter for feeding tube placement 04/27/2020   Sinus bradycardia 12/25/2019   COPD (chronic obstructive pulmonary disease) (HCC) 12/16/2018   Cough 12/16/2018   Unintentional weight loss 10/05/2018   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   Visit for monitoring Tikosyn therapy 11/18/2017   Closed nondisplaced fracture of head of radius with routine healing 03/03/2017   Coronary artery disease involving native coronary artery of native heart without angina pectoris    SOB (shortness of breath) 01/26/2016   Acute on chronic diastolic CHF (congestive heart failure) (HCC) 03/28/2015   Paroxysmal atrial fibrillation (HCC)    Essential hypertension    S/P femoral-popliteal bypass surgery    Obstructive hypertrophic cardiomyopathy (HCC) 03/03/2015   PAD (peripheral artery disease) (HCC) 03/02/2015   Acute respiratory failure with hypoxia (HCC) 03/02/2015   Claudication (HCC) 01/31/2015   Former smoker 01/31/2015   Hyperlipidemia 01/31/2015   Atherosclerosis of native arteries of extremity with intermittent claudication (HCC) 02/10/2014   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   Atrial arrhythmia    Mild aortic sclerosis    Past Medical History:  Past Medical History:  Diagnosis Date   Anxiety    Arthritis    "some in my lower back; probably elbows, knees" (11/18/2017)    Atrial fibrillation (HCC)    Bell's palsy    when pt. was 75 yrs old, when under stress the left side of face will droop.   Complication of anesthesia    "vascular OR 2016; BP bottomed out; couldn't get it regulated; ended up in ICU for DAYS" (11/18/2017)   GERD (gastroesophageal reflux disease)    History of kidney stones    Hypertension    Hypertrophic cardiomyopathy (HCC)    severe LV basilar hypertrophy witn no evidence of significant outflow tract obstruction, EF 65-70%, mild LAE, mild TR, grade 1a diastolic dysfunction 05/15/10 (Dr. Donato Schultz) (Atrial Septal Hypertrophy pattern)-- Intra-op TEE with dsignificant outflow tract obstruction - AI, MR & TR   Insomnia    Mild aortic sclerosis    Osteopenia    Peripheral vascular disease (HCC)    Syncope    , Vagal   Past Surgical History:  Past Surgical History:  Procedure Laterality Date   AUGMENTATION MAMMAPLASTY Bilateral    BACK SURGERY     CARDIAC CATHETERIZATION N/A 05/07/2016   Procedure: Left Heart Cath and Coronary Angiography;  Surgeon: Kathleene Hazel, MD;  Location: [facility] INVASIVE CV LAB;  Service: Cardiovascular;  Laterality: N/A;   CARDIOVERSION N/A 09/24/2017   Procedure: CARDIOVERSION;  Surgeon: Laurey Morale, MD;  Location: [facility] ENDOSCOPY;  Service: Cardiovascular;  Laterality: N/A;   DILATION AND CURETTAGE OF UTERUS     ENDARTERECTOMY FEMORAL Right 03/02/2015   Procedure: ENDARTERECTOMY RIGHT FEMORAL;  Surgeon: Chuck Hint, MD;  Location: [facility] OR;  Service: Vascular;  Laterality: Right;   ESOPHAGOGASTRODUODENOSCOPY (EGD) WITH PROPOFOL N/A 12/01/2020   Procedure: ESOPHAGOGASTRODUODENOSCOPY (EGD) WITH PROPOFOL;  Surgeon: Diamantina Monks, MD;  Location: [facility] ENDOSCOPY;  Service: General;  Laterality: N/A;   FACIAL COSMETIC SURGERY Left 2002   "related to Bell's Palsy @ age 55; left eye/side of face droopy; tried to make area symmetrical"   FEMORAL-POPLITEAL BYPASS GRAFT Right 03/02/2015   Procedure: BYPASS  GRAFT FEMORAL-BELOW KNEE POPLITEAL ARTERY;  Surgeon: Chuck Hint, MD;  Location: [facility] OR;  Service: Vascular;  Laterality: Right;   INGUINAL HERNIA REPAIR Bilateral 2002   IR ANGIO INTRA EXTRACRAN SEL COM CAROTID INNOMINATE UNI L MOD SED  11/16/2020   IR CT HEAD LTD  11/13/2020   IR PERCUTANEOUS ART THROMBECTOMY/INFUSION INTRACRANIAL INC DIAG ANGIO  11/13/2020   OVARIAN CYST REMOVAL Left    PEG PLACEMENT N/A 12/01/2020   Procedure: PERCUTANEOUS ENDOSCOPIC GASTROSTOMY (PEG) PLACEMENT;  Surgeon: Diamantina Monks, MD;  Location: [facility] ENDOSCOPY;  Service: General;  Laterality: N/A;   PERIPHERAL VASCULAR CATHETERIZATION N/A 01/16/2015   Procedure: Abdominal Aortogram;  Surgeon: Chuck Hint, MD;  Location: [facility] INVASIVE CV LAB;  Service: Cardiovascular;  Laterality: N/A;   POSTERIOR LUMBAR FUSION  2015   "have plates and screws in there"   RADIOLOGY WITH ANESTHESIA N/A 11/13/2020   Procedure: IR WITH ANESTHESIA;  Surgeon: Radiologist, Medication, MD;  Location: [facility] OR;  Service: Radiology;  Laterality: N/A;   TONSILLECTOMY     HPI:  Pt is a 75 y.o. female recently d/c to [facility] for rehab after admission 11/13/20-12/06/20 for multiple infarcts requiring revascularization and intubation, now readmitted 12/12/20 with respiratory distress. CXR noted increased R pleural effusion, unchanged L pleural effusion. Workup for sepsis secondary to PNA; question possibility of bacterial pneumonia versus aspiration with possibility of compounding factor of CHF exacerbation. 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 MBS 8/24 with dx of severe primarily oral dysphagia with adequate ability to protect airway with nectar-thick and puree - recommended therapeutic trials of puree/teaspoons of nectar with SLP>   Assessment / Plan /  Recommendation Clinical Impression  Pt presents with a severe aphasia but with notable improvements since she was D/Cd from [facility]. She demonstrates improved ability to follow simple, functional commands within predictable contexts (e.g., give me the toothbrush, comb your hair).  She remains non-verbal with significant apraxia/expressive aphasia, however, is using much more spontaneous facial expression during partner communication.  With max multimodal cueing, pt was able to generate volitional phonation during a singing task and when cued to say "bye" at end of session. Pt will benefit from intensive aphasia therapy - recommend continued SLP f/u while admitted and CIR upon discharge.    SLP Assessment  SLP Recommendation/Assessment: Patient needs continued Speech Lanaguage Pathology Services SLP Visit Diagnosis: Aphasia (R47.01)    Follow Up Recommendations  Inpatient Rehab    Frequency and Duration min 2x/week  2 weeks      SLP Evaluation Cognition  Overall Cognitive Status: Difficult to assess Orientation Level: Other (comment) (aphasic)       Comprehension  Auditory Comprehension Overall Auditory Comprehension: Impaired Yes/No Questions: Impaired Basic Biographical Questions:  (no responses) Commands: Impaired One Step Basic Commands: 0-24% accurate Visual Recognition/Discrimination Discrimination: Exceptions to [facility] Common Objects: Unable to indentify    Expression Verbal Expression Overall Verbal Expression: Impaired Initiation: Impaired Level of Generative/Spontaneous Verbalization:  (unable) Repetition: Impaired Common Objects: Unable to indentify Written Expression Dominant Hand: Right  Oral / Motor  Oral Motor/Sensory Function Overall Oral Motor/Sensory Function: Severe impairment Facial ROM: Reduced right;Suspected CN VII (facial) dysfunction Facial Symmetry: Abnormal symmetry right;Suspected CN VII (facial) dysfunction Facial Strength: Reduced right;Suspected  CN VII (facial) dysfunction Lingual ROM: Reduced right;Reduced left Lingual Symmetry: Within Functional Limits Motor Speech Overall Motor Speech: Impaired Articulation: Impaired Motor Planning: Impaired   GO                   Itzy Adler L. Samson Frederic, MA CCC/SLP Acute Rehabilitation Services Office number [phone] Pager [phone]  [name] 12/14/2020, 5:05 PM

## 2020-12-14 NOTE — Progress Notes (Signed)
Physical Therapy Treatment Patient Details Name: Beverly Li MRN: [id number] DOB: 07-09-1945 Today's Date: 12/14/2020    History of Present Illness Pt is a 75 y.o. female recently d/c to [facility] for rehab after admission 11/13/20-12/06/20 for multiple infarcts requiring revascularization and intubation, now readmitted 12/12/20 with respiratory distress. CXR noted increased R pleural effusion, unchanged L pleural effusion. Workup for sepsis secondary to PNA; question possibility of bacterial pneumonia versus aspiration with possibility of compounding factor of CHF exacerbation. 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   PT Comments    Pt progressing with mobility. Today's session focused on seated balance, as well as transfer training with standing stedy frame. Pt able to stand with minA-maxA+2, reliant on LUE support as well as R knee being blocked by standing frame. Pt tolerated ADL task seated at sink in stedy frame; dependent for posterior pericare while standing with assist. Pt non-verbal (attempted a few noises), but expressive during session, following some simple commands appropriately with LUE. Pt motivated to participate; family supportive. Pt remains an excellent candidate for intensive CIR-level therapies to maximize functional mobility and decrease caregiver burden. Will continue to follow acutely to address established goals.    Follow Up Recommendations  CIR;Supervision/Assistance - 24 hour     Equipment Recommendations  Wheelchair (measurements PT);Wheelchair cushion (measurements PT);Hospital bed;3in1 (PT);Lift equipment   Recommendations for Other Services Rehab consult     Precautions / Restrictions Precautions Precautions: Fall;Other (comment) Precaution Comments: R hemiplegia,  PEG, adominal binder; bladder/bowel incontinence Restrictions Weight Bearing Restrictions: No    Mobility  Bed Mobility Overal bed mobility: Needs Assistance Bed Mobility: Supine to Sit     Supine to sit: Max assist;+2 for safety/equipment;HOB elevated     General bed mobility comments: Assistance to bring BLEs towards EOB and then elevate trunk. Initiating to grab bedrail with LUE when cued and push up with LUE automatically    Transfers Overall transfer level: Needs assistance Equipment used: Ambulation equipment used Transfers: Sit to/from Stand Sit to Stand: Min assist;Max assist;+2 physical assistance         General transfer comment: Min A for initial stand from EOB into stedy standing frame with LUE support pulling on bar and R knee blocked by frame. Pt then able to perform multiple sit<>stand from stedy seat varying from minA to maxA+2, increased assist with fatigue; pt requires totalA to maintain RUE grasp on stedy bar and keep R knee blocked by stedy frame. MaxA for eccentric control to low recliner seat  Ambulation/Gait                 Stairs             Wheelchair Mobility    Modified Rankin (Stroke Patients Only)       Balance Overall balance assessment: Needs assistance Sitting-balance support: Single extremity supported;Feet supported Sitting balance-Leahy Scale: Poor Sitting balance - Comments: reliant on min-maxA external assist, stability improved with LUE support   Standing balance support: Bilateral upper extremity supported;During functional activity Standing balance-Leahy Scale: Zero Standing balance comment: Reliant on external assist and LUE support to maintain standing balance, as well as R knee blocked from buckling                            Cognition Arousal/Alertness: Awake/alert Behavior During  Therapy: Flat affect Overall Cognitive Status: Difficult to assess Area of Impairment: Following  commands;Awareness;Problem solving;Safety/judgement;Attention                   Current Attention Level: Sustained;Focused   Following Commands: Follows one step commands inconsistently;Follows one step commands with increased time Safety/Judgement: Decreased awareness of safety;Decreased awareness of deficits Awareness: Intellectual Problem Solving: Slow processing;Difficulty sequencing;Decreased initiation;Requires verbal cues;Requires tactile cues General Comments: This patient in known to this therapist from last admission. Noting pt more expressive. Attempting to respond to conversational questions but still presenting with expressive aphasia. Presenitng with increased following of commands and very motivated. Cntinue to require increased time and cues      Exercises      General Comments General comments (skin integrity, edema, etc.): VSS on 2L O2      Pertinent Vitals/Pain Pain Assessment: Faces Faces Pain Scale: No hurt Pain Location: generalized Pain Descriptors / Indicators: Grimacing Pain Intervention(s): Monitored during session    Home Living     Available Help at Discharge: Family;Available 24 hours/day Type of Home: Skilled Nursing Facility              Prior Function            PT Goals (current goals can now be found in the care plan section) Acute Rehab PT Goals Patient Stated Goal: Family unsure if post-acute rehab, or home with 24/7 support, or LTC Progress towards PT goals: Progressing toward goals    Frequency    Min 4X/week      PT Plan Current plan remains appropriate    Co-evaluation PT/OT/SLP Co-Evaluation/Treatment: Yes Reason for Co-Treatment: Necessary to address cognition/behavior during functional activity;For patient/therapist safety;To address functional/ADL transfers PT goals addressed during session: Mobility/safety with mobility;Balance;Proper use of DME OT goals addressed during session: ADL's and self-care       AM-PAC PT "6 Clicks" Mobility   Outcome Measure  Help needed turning from your back to your side while in a flat bed without using bedrails?: A Lot Help needed moving from lying on your back to sitting on the side of a flat bed without using bedrails?: A Lot Help needed moving to and from a bed to a chair (including a wheelchair)?: Total Help needed standing up from a chair using your arms (e.g., wheelchair or bedside chair)?: Total Help needed to walk in hospital room?: Total Help needed climbing 3-5 steps with a railing? : Total 6 Click Score: 8    End of Session Equipment Utilized During Treatment: Gait belt Activity Tolerance: Patient tolerated treatment well Patient left: in chair;with call bell/phone within reach;with chair alarm set;with family/visitor present Nurse Communication: Mobility status;Need for lift equipment PT Visit Diagnosis: Muscle weakness (generalized) (M62.81);Difficulty in walking, not elsewhere classified (R26.2);Other symptoms and signs involving the nervous system (R29.898);Hemiplegia and hemiparesis Hemiplegia - Right/Left: Right Hemiplegia - dominant/non-dominant: Dominant Hemiplegia - caused by: Cerebral infarction     Time: 1356-1445 PT Time Calculation (min) (ACUTE ONLY): 49 min  Charges:  $Therapeutic Activity: 8-22 mins                     Ina Homes, PT, DPT Acute Rehabilitation Services  Pager [phone] Office [phone]  Malachy Chamber 12/14/2020, 5:55 PM

## 2020-12-14 NOTE — Progress Notes (Signed)
Inpatient Rehab Admissions Coordinator:   Met with pt's son to discuss CIR goals/expectations.  I let him know that our program would be 3 hrs/day of therapy, with physician monitoring, and ELOS around 3 weeks.  I shared with him that [facility] Medicare requires prior authorization, and also that they would not approve SNF following CIR (family would need to take patient home and provide care).  He shares that they are looking for long term care, ultimately, but want to maximize pt's independence with rehab prior to that.  They are able to take her home, short term, if a LTC bed is not available at time of d/c from CIR.  They will need to hire caregivers and they are able to do that. I will pursue prior auth for CIR and I will provides updates as available.   Shann Medal, PT, DPT Admissions Coordinator [phone] 12/14/20  3:13 PM

## 2020-12-14 NOTE — Care Management (Signed)
1152 12-14-20 Case Manager spoke with the son Concetta Guion on yesterday, regarding plan of care. Patient was previously from [facility]. Family has the SNF bed on hold at this time. Physical Therapy recommendations are for CIR. The Admissions coordinator screened the patient and the patient is a potential candidate for CIR. Case Manager spoke with son and family; all are agreeable for the Admission Coordinator to submit clinicals to the insurance company to see if the patient can be approved for CIR services. Case Manager will continue to follow for additional disposition needs.

## 2020-12-15 LAB — CBC
HCT: 39.4 % (ref 36.0–46.0)
Hemoglobin: 12.5 g/dL (ref 12.0–15.0)
MCH: 31.4 pg (ref 26.0–34.0)
MCHC: 31.7 g/dL (ref 30.0–36.0)
MCV: 99 fL (ref 80.0–100.0)
Platelets: 215 10*3/uL (ref 150–400)
RBC: 3.98 MIL/uL (ref 3.87–5.11)
RDW: 17.4 % — ABNORMAL HIGH (ref 11.5–15.5)
WBC: 10.3 10*3/uL (ref 4.0–10.5)
nRBC: 0 % (ref 0.0–0.2)

## 2020-12-15 LAB — GLUCOSE, CAPILLARY
Glucose-Capillary: 102 mg/dL — ABNORMAL HIGH (ref 70–99)
Glucose-Capillary: 121 mg/dL — ABNORMAL HIGH (ref 70–99)
Glucose-Capillary: 132 mg/dL — ABNORMAL HIGH (ref 70–99)
Glucose-Capillary: 137 mg/dL — ABNORMAL HIGH (ref 70–99)
Glucose-Capillary: 145 mg/dL — ABNORMAL HIGH (ref 70–99)
Glucose-Capillary: 98 mg/dL (ref 70–99)

## 2020-12-15 LAB — URINE CULTURE: Culture: >=100000 — AB

## 2020-12-15 NOTE — PMR Pre-admission (Signed)
PMR Admission Coordinator Pre-Admission Assessment  Patient: Beverly Li is an 75 y.o., female MRN: [id number] DOB: Jan 12, 1946 Height: _0  (160 cm) Weight: 50.8 kg  Insurance Information HMO:     PPO: yes     PCP:      IPA:      80/20:      OTHER:  PRIMARY: [employer] Medicare      Policy#: [id number]      Subscriber: pt CM Name:       Phone#: [phone]     Fax#: [phone] Pre-Cert#: [id number] [place] for CIR given by Helene Kelp with updates due to fax listed above on 9/14      Employer:  Benefits:  Phone #: [phone]     Name:  Eff. Date: 04/15/20     Deduct: $0      Out of Pocket Max: $3900 (met $720.69)      Life Max: n/a CIR: $295/day for days 1-5      SNF: 20 full days Outpatient:      Co-Pay: $20/visit Home Health: 100%      Co-Pay:  DME: 80%     Co-Ins: 20% Providers:  SECONDARY:       Policy#:      Phone#:   Development worker, community:       Phone#:   The "Data Collection Information Summary" for patients in Inpatient Rehabilitation Facilities with attached "Privacy Act [place] Records" was provided and verbally reviewed with: Patient and Family  Emergency Contact Information Contact Information     Name Relation Home Work Mobile   Delayni, Streed [phone]  [phone]   Gyneth, Hubka   [phone]       Current Medical History  Patient Admitting Diagnosis: CVA   History of Present Illness: Beverly Li is a 75 year old right-handed female with history of chronic diastolic congestive heart failure, atrial fibrillation maintained on Eliquis, Bell's palsy, hypertension, OSA, prediabetes, right femoral-popliteal bypass graft stenosis followed by vascular surgery, recent hospitalization 8/1-8/24/2022 for 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  She was discharged to skilled nursing facility.  Presented 12/12/2020 with labored breathing as well as  low-grade fever.  Noted admission chemistries WBC 19,000 BUN 34 troponin high-sensitivity 173.  Chest x-ray noted increased right pleural effusion with worsening opacity in the right lung base with unchanged left pleural effusion.  Placed on broad-spectrum antibiotics.  Blood cultures remain negative.  She continued on Eliquis.  N.p.o. with PEG tube feeds ongoing as prior to admission.  Antibiotic therapy has been completed.  Therapy evaluations completed due to patient decreased functional ability with recent history of CVA she was recommended for a comprehensive rehab program.    Patient's medical record from [facility] has been reviewed by the rehabilitation admission coordinator and physician.  Past Medical History  Past Medical History:  Diagnosis Date   Anxiety    Arthritis    "some in my lower back; probably elbows, knees" (11/18/2017)   Atrial fibrillation (HCC)    Bell's palsy    when pt. was 75 yrs old, when under stress the left side of face will droop.   Complication of anesthesia    "vascular OR 2016; BP bottomed out; couldn't get it regulated; ended up in ICU for DAYS" (11/18/2017)   GERD (gastroesophageal reflux disease)    History of kidney stones    Hypertension    Hypertrophic cardiomyopathy ([place])  severe LV basilar hypertrophy witn no evidence of significant outflow tract obstruction, EF 65-70%, mild LAE, mild TR, grade 1a diastolic dysfunction 7/37/10 (Dr. Candee Furbish) (Atrial Septal Hypertrophy pattern)-- Intra-op TEE with dsignificant outflow tract obstruction - AI, MR & TR   Insomnia    Mild aortic sclerosis    Osteopenia    Peripheral vascular disease (HCC)    Syncope    , Vagal    Has the patient had major surgery during 100 days prior to admission? Yes  Family History   family history includes Breast cancer in her sister; Cancer in her father, mother, and sister; Hypertension in her mother; Liver cancer in her mother; Lung cancer in her father.  Current  Medications  Current Facility-Administered Medications:    acetaminophen (TYLENOL) 160 MG/5ML solution 650 mg, 650 mg, Per Tube, Q4H PRN, Tamala Julian, Rondell A, MD   amiodarone (PACERONE) tablet 200 mg, 200 mg, Per Tube, Daily, Tamala Julian, Rondell A, MD, 200 mg at 12/20/20 0907   apixaban (ELIQUIS) tablet 5 mg, 5 mg, Per Tube, BID, Smith, Rondell A, MD, 5 mg at 12/21/20 0826   arformoterol (BROVANA) nebulizer solution 15 mcg, 15 mcg, Nebulization, BID, Smith, Rondell A, MD, 15 mcg at 12/21/20 0719   budesonide (PULMICORT) nebulizer solution 0.5 mg, 0.5 mg, Nebulization, BID, Smith, Rondell A, MD, 0.5 mg at 12/21/20 0719   buPROPion (WELLBUTRIN) tablet 75 mg, 75 mg, Per Tube, BID, Tamala Julian, Rondell A, MD, 75 mg at 12/21/20 6269   ezetimibe (ZETIA) tablet 10 mg, 10 mg, Per Tube, Daily, Tamala Julian, Rondell A, MD, 10 mg at 12/20/20 0907   feeding supplement (JEVITY 1.2 CAL) liquid 356 mL, 356 mL, Per Tube, QID, Domenic Polite, MD, 356 mL at 12/21/20 0826   free water 200 mL, 200 mL, Per Tube, QID, Domenic Polite, MD, 200 mL at 12/21/20 0835   insulin aspart (novoLOG) injection 0-9 Units, 0-9 Units, Subcutaneous, Q4H, Smith, Rondell A, MD, 2 Units at 12/21/20 0140   ipratropium-albuterol (DUONEB) 0.5-2.5 (3) MG/3ML nebulizer solution 3 mL, 3 mL, Nebulization, Q4H PRN, Domenic Polite, MD   MEDLINE mouth rinse, 15 mL, Mouth Rinse, BID, Domenic Polite, MD, 15 mL at 12/20/20 2148   metoprolol tartrate (LOPRESSOR) tablet 12.5 mg, 12.5 mg, Per Tube, BID, Smith, Rondell A, MD, 12.5 mg at 12/20/20 2147   pantoprazole sodium (PROTONIX) 40 mg oral suspension 40 mg, 40 mg, Per Tube, QHS, Smith, Rondell A, MD, 40 mg at 12/20/20 2147   potassium chloride (KLOR-CON) packet 20 mEq, 20 mEq, Per Tube, Daily, Domenic Polite, MD, 20 mEq at 12/20/20 4854   revefenacin (YUPELRI) nebulizer solution 175 mcg, 175 mcg, Nebulization, Daily, Fuller Plan A, MD, 175 mcg at 12/21/20 0720  Patients Current Diet:  Diet Order              Diet NPO time specified  Diet effective now                   Precautions / Restrictions Precautions Precautions: Fall, Other (comment) Precaution Comments: R hemiplegia with R shoulder subluxation, PEG, adominal binder; bladder/bowel incontinence Restrictions Weight Bearing Restrictions: No   Has the patient had 2 or more falls or a fall with injury in the past year? Yes  Prior Activity Level Community (5-7x/wk): independent prior to admission with no DME, driving, no assist for I/ADL  Prior Functional Level Self Care: Did the patient need help bathing, dressing, using the toilet or eating? Independent  Indoor Mobility: Did the patient need  assistance with walking from room to room (with or without device)? Independent  Stairs: Did the patient need assistance with internal or external stairs (with or without device)? Independent  Functional Cognition: Did the patient need help planning regular tasks such as shopping or remembering to take medications? Independent  Patient Information Are you of Hispanic, Latino/a,or Spanish origin?: X. Patient unable to respond What is your race?: X. Patient unable to respond Do you need or want an interpreter to communicate with a doctor or health care staff?: 9. Unable to respond  Patient's Response To:  Health Literacy and Transportation Is the patient able to respond to health literacy and transportation needs?: No  Home Assistive Devices / [place] Devices/Equipment: Other (Comment) (From SNF) Home Equipment: Toilet riser, Environmental consultant - 2 wheels, Shower seat, Grab bars - tub/shower, Wheelchair - manual, [employer] - single point  Prior Device Use: Indicate devices/aids used by the patient prior to current illness, exacerbation or injury? None of the above  Current Functional Level Cognition  Overall Cognitive Status: Difficult to assess Difficult to assess due to: Impaired communication Current Attention Level:  Focused Orientation Level: Other (comment) (She is unable to speak, I could not assess this) Following Commands: Follows multi-step commands inconsistently, Follows one step commands inconsistently, Follows one step commands with increased time Safety/Judgement: Decreased awareness of safety, Decreased awareness of deficits General Comments: Pt beginning to show more of her personality by shrugging shoulders, rolling eyes, giggling, or smiling. Still not verbalizing words though this session. Pt follows simple commands with delay inconsistently. Requires multi-modal cues to sequence and perform all tasks.    Extremity Assessment (includes Sensation/Coordination)  Upper Extremity Assessment: RUE deficits/detail RUE Deficits / Details: Flaccid with no attention or activition of RUE. no responce to painful stimuli RUE Sensation: decreased light touch RUE Coordination: decreased fine motor, decreased gross motor LUE Deficits / Details: Using RUE to wash her face. Difficulty with motor planning. decreased strength LUE Coordination: decreased fine motor  Lower Extremity Assessment: Defer to PT evaluation RLE Deficits / Details: No active movement noted in RLE; good passive ROM, but noted calf/ankle tightness due to resting in PF RLE Coordination: decreased fine motor, decreased gross motor LLE Deficits / Details: Generalized weakness; able to accept weight on LLE in standing without knee buckling, attempting to hop on L foot despite R knee buckling LLE Coordination: decreased gross motor    ADLs  Overall ADL's : Needs assistance/impaired Eating/Feeding: NPO Grooming: Sitting, Maximal assistance Grooming Details (indicate cue type and reason): sitting on staady at the sink. incrased assist this session due to hand over hand use of R hand to complete grooming tasks. vc for looking into mirror and lookign at R hand during task Upper Body Bathing: Sitting, Maximal assistance Lower Body Bathing: Total  assistance, +2 for physical assistance, Sit to/from stand Upper Body Dressing : Maximal assistance, Sitting Lower Body Dressing: Total assistance, +2 for physical assistance, Sit to/from stand Lower Body Dressing Details (indicate cue type and reason): don socks Toilet Transfer: Minimal assistance, Maximal assistance (sit<>stand with stedy at recliner) Toilet Transfer Details (indicate cue type and reason): Min A for power up from EOB. Pt then able to perform sit<>stand from stedy seat during peri care. As pt fatigues , she required Max A for power up Toileting- Clothing Manipulation and Hygiene: Maximal assistance, +2 for physical assistance Toileting - Clothing Manipulation Details (indicate cue type and reason): one person for maintaining standing and second person for peri care Functional mobility  during ADLs: Minimal assistance, Total assistance (min A +2 for sit<>stand with use of steady frame - total A for transfers with steady) General ADL Comments: pt required incrased cueing and time for processing for all ADL tasks    Mobility  Overal bed mobility: Needs Assistance Bed Mobility: Supine to Sit Rolling: Max assist Sidelying to sit: Mod assist, +2 for physical assistance, +2 for safety/equipment Supine to sit: Mod assist, HOB elevated General bed mobility comments: Cues provided to manage legs to L EOB, needing assistance at R with intermittent muscle activation noted. Assistance provided to place R hand on chest and rotate trunk onto L elbow, but pt repositioning L elbow and displaying good initiation with delay to push up on L hand to ascend trunk from elevated HOB, modA.    Transfers  Overall transfer level: Needs assistance Equipment used: 2 person hand held assist Transfer via Lift Equipment: Stedy Transfers: Sit to/from Stand, [employer] Transfers Sit to Stand: Mod assist, +2 safety/equipment Stand pivot transfers: Max assist, +2 physical assistance, +2  safety/equipment General transfer comment: Sit to stand 1x from EOB and 1x from recliner, cuing for hand placement and hand-over-hand R hand placement to assist with pushing up to stand. R knee blocked anteriorly and posteriorly to avoid buckling and hyperextension, modA to power up to stand, +2 for safety. MaxAx2 to steady pt, block R knee, provide UE support, shift weight, and advance legs to stand step to L.    Ambulation / Gait / Stairs / Wheelchair Mobility  Ambulation/Gait Ambulation/Gait assistance: Max assist, +2 physical assistance, +2 safety/equipment Gait Distance (Feet): 2 Feet (x2 bouts of ~2 ft first bout and then focused on weight shifting with L foot advancement standing in front of chair 2nd bout) Assistive device: 2 person hand held assist Gait Pattern/deviations: Step-to pattern, Decreased weight shift to right, Decreased weight shift to left, Trunk flexed, Narrow base of support, Decreased stride length General Gait Details: MaxAx2 to steady pt, block R knee, provide UE support, shift weight, and advance legs to stand step to L 1x. Pt displays poor weight shifting to R to allow L foot to clear the ground, needing assistance to slide her L foot to step to L. Thus, second bout focused on weight shifting to R with R knee blocked anteriorly and posteriorly to prevent buckling and hyperextension while cuing pt to lift her L foot off the ground. Pt showing attempts to lift her leg but needed physical assistance to lift her L leg due to likely distrust of her R leg. Needed physical assistance and cues to improve trunk postue. Gait velocity: decreased Gait velocity interpretation: <1.31 ft/sec, indicative of household ambulator    Posture / Balance Dynamic Sitting Balance Sitting balance - Comments: UE support and minA to sit statically EOB. Balance Overall balance assessment: Needs assistance Sitting-balance support: Single extremity supported, Feet supported Sitting balance-Leahy  Scale: Poor Sitting balance - Comments: UE support and minA to sit statically EOB. Postural control: Posterior lean, Left lateral lean Standing balance support: Bilateral upper extremity supported, During functional activity Standing balance-Leahy Scale: Poor Standing balance comment: Bil HHA and modA with R knee blocked to stand statically. MaxAx2 to attempt to ambulate.    Special needs/care consideration Oxygen 2L (new need), Skin PEG site, and Diabetic management yes   Previous Home Environment (from acute therapy documentation) Living Arrangements: Spouse/significant other Available Help at Discharge: Family, Available 24 hours/day Type of Home: [place]: Two level, Able to live  on main level with bedroom/bathroom Home Access: Stairs to enter Entrance Stairs-Rails: None Entrance Stairs-Number of Steps: 1 Bathroom Shower/Tub: Chiropodist: Standard Home Care Services: No Additional Comments: Recent admission for stroke with d/c to [facility] 12/06/20. Husband reports unsure if pt to return home, or if able to come home with hired caregivers  Discharge Living Setting Plans for Discharge Living Setting: Patient's home, Lives with (comment) (husband) Type of Home at Discharge: House Discharge Home Layout: 1/2 bath on main level Discharge Home Access: Stairs to enter Entrance Stairs-Rails: None Entrance Stairs-Number of Steps: 1 Discharge Bathroom Shower/Tub: Tub/shower unit Discharge Bathroom Toilet: Standard Does the patient have any problems obtaining your medications?: No  Social/Family/Support Systems Patient Roles: Spouse Contact Information: spouse, Angellica Maddison Anticipated Caregiver: hired care givers, son Alvester Chou, is primary contact Anticipated Caregiver's Contact Information: [phone] Ability/Limitations of Caregiver: husband is unable to provide much physical assist, son and other family members work; Secretary/administrator on hiring  caregivers for transition home after rehab prior to LTC Caregiver Availability: 24/7 Discharge Plan Discussed with Primary Caregiver: Yes Is Caregiver In Agreement with Plan?: Yes Does Caregiver/Family have Issues with Lodging/Transportation while Pt is in Rehab?: No  Goals Patient/Family Goal for Rehab: PT min assist w/c level, OT min/mod assist, SLP mod/max assist Expected length of stay: 30-32 days Pt/Family Agrees to Admission and willing to participate: Yes Program Orientation Provided & Reviewed with Pt/Caregiver Including Roles  & Responsibilities: Yes  Barriers to Discharge: Insurance for SNF coverage  Decrease burden of Care through IP rehab admission: n/a  Possible need for SNF placement upon discharge: No. Plan for d/c home with family and hired caregiver support.   Patient Condition: I have reviewed medical records from [facility], spoken with CM, and patient, spouse, and son. I met with patient at the bedside for inpatient rehabilitation assessment.  Patient will benefit from ongoing PT, OT, and SLP, can actively participate in 3 hours of therapy a day 5 days of the week, and can make measurable gains during the admission.  Patient will also benefit from the coordinated team approach during an Inpatient Acute Rehabilitation admission.  The patient will receive intensive therapy as well as Rehabilitation physician, nursing, social worker, and care management interventions.  Due to bladder management, bowel management, safety, skin/wound care, disease management, medication administration, pain management, and patient education the patient requires 24 hour a day rehabilitation nursing.  The patient is currently max +2 with mobility and basic ADLs.  Discharge setting and therapy post discharge at home with home health is anticipated.  Patient has agreed to participate in the Acute Inpatient Rehabilitation Program and will admit today.  Preadmission Screen Completed By:  Michel Santee,  PT, DPT 12/21/2020 10:13 AM ______________________________________________________________________   Discussed status with Dr. Dagoberto Ligas on 12/21/20  at 10:13 AM  and received approval for admission today.  Admission Coordinator:  Michel Santee, PT, DPT time 10:13 AM Sudie Grumbling 12/21/20    Assessment/Plan: Diagnosis: Does the need for close, 24 hr/day Medical supervision in concert with the patient's rehab needs make it unreasonable for this patient to be served in a less intensive setting? Yes Co-Morbidities requiring supervision/potential complications: dysphagia due to L MCA stroke with new PEG; Aspiration pneumonia; dCHF, Afib on Eliquis; Bell's palsy; HTN, prediabetes Due to bladder management, bowel management, safety, skin/wound care, disease management, medication administration, pain management, and patient education, does the patient require 24 hr/day rehab nursing? Yes Does the patient require coordinated care of  a physician, rehab nurse, PT, OT, and SLP to address physical and functional deficits in the context of the above medical diagnosis(es)? Yes Addressing deficits in the following areas: balance, endurance, locomotion, strength, transferring, bowel/bladder control, bathing, dressing, feeding, grooming, toileting, cognition, speech, language, swallowing, and psychosocial support Can the patient actively participate in an intensive therapy program of at least 3 hrs of therapy 5 days a week? Yes The potential for patient to make measurable gains while on inpatient rehab is fair Anticipated functional outcomes upon discharge from inpatient rehab: min assist and mod assist PT, mod assist and max assist OT, min assist and mod assist SLP Estimated rehab length of stay to reach the above functional goals is: ~30 days Anticipated discharge destination: Home 10. Overall Rehab/Functional Prognosis: fair   MD Signature:

## 2020-12-15 NOTE — Progress Notes (Signed)
Physical Therapy Treatment Patient Details Name: Beverly Li MRN: [id number] DOB: 1946/03/07 Today's Date: 12/15/2020    History of Present Illness Pt is a 75 y.o. female recently d/c to [facility] for rehab after admission 11/13/20-12/06/20 for multiple infarcts requiring revascularization and intubation, now readmitted 12/12/20 with respiratory distress. CXR noted increased R pleural effusion, unchanged L pleural effusion. Workup for sepsis secondary to PNA; question possibility of bacterial pneumonia versus aspiration with possibility of compounding factor of CHF exacerbation. 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   PT Comments    Pt progressing with mobility. Today's session focused on seated balance and transfer training with Stedy frame; pt requires modA+1 to maxA+2 for mobility. Pt continues to attempt facial expressions, as well as verbalizations this session; motivated to participate. Pt remains limited by generalized weakness, R-side hemiplegia, expressive aphasia, impaired balance. Continue to recommend intensive CIR-level therapies to maximize functional mobility and independence prior to return home.  SpO2 96% on 2L O2 HR 90s-100s BP 132/92    Follow Up Recommendations  CIR;Supervision/Assistance - 24 hour     Equipment Recommendations  Wheelchair (measurements PT);Wheelchair cushion (measurements PT);Hospital bed;3in1 (PT);Other (comment) (lift equipment)    Recommendations for Other Services Rehab consult     Precautions / Restrictions Precautions Precautions: Fall;Other (comment) Precaution Comments: R hemiplegia, PEG, adominal binder; bladder/bowel incontinence Restrictions Weight Bearing Restrictions: No    Mobility  Bed Mobility Overal bed mobility: Needs Assistance Bed  Mobility: Supine to Sit     Supine to sit: Max assist     General bed mobility comments: Good initiating of LLE movement to EOB, maxA for RLE management, scooting hips and trunk elevation    Transfers Overall transfer level: Needs assistance Equipment used: Ambulation equipment used Transfers: Sit to/from Stand   Stand pivot transfers: Mod assist;Max assist;+2 physical assistance;+2 safety/equipment       General transfer comment: Initial modA+1 standing from raised EOB to stand in stedy frame with LUE support on bar and R knee blocked in frame; additional sit<>stands from stedy seat with assist fluctuating from modA+1 to maxA+2 with increasing fatigue; repeated verbal cues for hand placement and sequencing  Ambulation/Gait                 Stairs             Wheelchair Mobility    Modified Rankin (Stroke Patients Only)       Balance Overall balance assessment: Needs assistance Sitting-balance support: Single extremity supported;Feet supported Sitting balance-Leahy Scale: Poor Sitting balance - Comments: reliant on min-maxA external assist, stability improved with LUE support   Standing balance support: Bilateral upper extremity supported;During functional activity Standing balance-Leahy Scale: Zero Standing balance comment: Reliant on external assist and LUE support to maintain standing balance, as well as R knee blocked from buckling; dependent for posterior pericare from bladder/bowel incontinence while standing                            Cognition Arousal/Alertness: Awake/alert Behavior During Therapy: Flat affect Overall Cognitive Status: Difficult to assess Area of Impairment: Following commands;Awareness;Problem solving;Safety/judgement;Attention                   Current Attention Level: Sustained;Focused   Following Commands: Follows one step commands inconsistently;Follows one step commands with increased  time Safety/Judgement: Decreased awareness of safety;Decreased awareness of deficits Awareness: Intellectual Problem Solving: Slow processing;Difficulty sequencing;Decreased initiation;Requires verbal cues;Requires tactile cues General Comments: Pt continues to attempt to be expressive this session, also noticed a few attempts to form words in response to questions; still with expressive aphasia. Following good amount simple commands with L-side, with increased time and cuing.      Exercises General Exercises - Lower Extremity Long Arc Quad: AROM;Left;PROM;Right;Seated Hip Flexion/Marching: AROM;Left;PROM;Right;Seated Toe Raises: AROM;Left;Seated    General Comments General comments (skin integrity, edema, etc.): Pt's friend Dois Davenport) present and supportive. BP 132/92 post-mobility, HR 90s-100s, SpO2 96% on 2L      Pertinent Vitals/Pain Pain Assessment: Faces Faces Pain Scale: No hurt Pain Intervention(s): Monitored during session    Home Living                      Prior Function            PT Goals (current goals can now be found in the care plan section) Progress towards PT goals: Progressing toward goals    Frequency    Min 4X/week      PT Plan Current plan remains appropriate    Co-evaluation              AM-PAC PT "6 Clicks" Mobility   Outcome Measure  Help needed turning from your back to your side while in a flat bed without using bedrails?: A Lot Help needed moving from lying on your back to sitting on the side of a flat bed without using bedrails?: A Lot Help needed moving to and from a bed to a chair (including a wheelchair)?: Total Help needed standing up from a chair using your arms (e.g., wheelchair or bedside chair)?: Total Help needed to walk in hospital room?: Total Help needed climbing 3-5 steps with a railing? : Total 6 Click Score: 8    End of Session Equipment Utilized During Treatment: Gait belt;Oxygen Activity Tolerance:  Patient tolerated treatment well Patient left: in chair;with call bell/phone within reach;with chair alarm set;with family/visitor present Nurse Communication: Mobility status;Need for lift equipment PT Visit Diagnosis: Muscle weakness (generalized) (M62.81);Difficulty in walking, not elsewhere classified (R26.2);Other symptoms and signs involving the nervous system (R29.898);Hemiplegia and hemiparesis Hemiplegia - Right/Left: Right Hemiplegia - dominant/non-dominant: Dominant Hemiplegia - caused by: Cerebral infarction     Time: 1110-1149 PT Time Calculation (min) (ACUTE ONLY): 39 min  Charges:  $Therapeutic Exercise: 8-22 mins $Therapeutic Activity: 23-37 mins                     Ina Homes, PT, DPT Acute Rehabilitation Services  Pager [phone] Office [phone]  Malachy Chamber 12/15/2020, 12:43 PM

## 2020-12-15 NOTE — Progress Notes (Signed)
SLP Cancellation Note  Patient Details Name: Beverly Li MRN: [id number] DOB: Mar 09, 1946   Cancelled treatment:       Reason treatment Not Completed: Patient unavailable, being bathed upon arrival.    Odin L. Samson Frederic, MA CCC/SLP Acute Rehabilitation Services Office number [phone] Pager [phone]    [name] 12/15/2020, 3:17 PM

## 2020-12-15 NOTE — Care Management Important Message (Signed)
Important Message  Patient Details  Name: Beverly Li MRN: [id number] Date of Birth: [birth year]   Medicare Important Message Given:  Yes     Renie Ora 12/15/2020, 11:48 AM

## 2020-12-15 NOTE — Progress Notes (Addendum)
PROGRESS NOTE    Beverly Li  VXB:[id number] DOB: [birth year] DOA: 12/12/2020 PCP: Clayborn Heron, MD  Brief Narrative:Beverly Li is a 75 y.o. 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 -She is currently at [facility], when her husband left her side on 8/29 she had been doing well and had been working with speech therapy and taking sips of sugar water.  However, once he returned 8/30 morning noted that the patient was having labored breathing.  She had not been on any other oral foods and had been receiving all nutrition through her PEG tube.   -In the emergency room she was febrile to 101.3, tachypneic, tachycardic and hypoxic requiring 4 L of O2, WBC was 19 K, chest x-ray noted right pleural effusion with worsening right basilar opacity   Assessment & Plan:  Aspiration pneumonia Sepsis, poa  -She has multiple antibiotic allergies,  -Clinically improving, on ceftriaxone and Flagyl -Continue nutrition and meds via PEG tube, change to Po Abx tomorrow -SLP assessment appreciated -Blood cultures are negative -PT OT eval completed, CIR recommended -discharge planning  Acute respiratory failure with hypoxia  -COPD, without exacerbation:  -Previously discharged to SNF on 1 L O2, currently on 3 L  -Chest x-ray and previous imaging noted severe emphysema, now with aspiration pneumonia  -As above    Paroxysmal atrial fibrillation  -Continue Eliquis, metoprolol, and amiodarone   Diastolic CHF: Acute on chronic.   - noted to have worsening right-sided pleural effusion.  BNP elevated at 970.9.  Last echocardiogram revealed EF of 60 - 65%.   -Require diuretics last admission as well, given IV Lasix x1 on admission -continue PO  lasix -bmp in am   Essential hypertension:  -Blood pressure is softer, discontinued hydralazine, continue metoprolol 12.5 mg twice daily.   Recent left MCA, PCA stroke with right hemiplegia, aphasia, dysphagia  -Patient had been receiving therapies at [facility] after left MCA stroke status post thrombectomy. -PT/OT/speech to evaluate and treat  Abnormal UA and Cultures -unable to assess symptoms, improving on Abx for Asp PNA, monitor clinically   Dysphagia: Secondary to recent stroke PEG placed on 8/19. -Continue tube feeds with free water flushes   Anxiety -Continue Wellbutrin   Right femoral popliteal bypass graft stenosis -Patient previously recommended to follow-up with vascular surgery in 2 to 3 months   GERD -Continue protonix   DVT prophylaxis: Eliquis Code Status: Full Family Communication: Husband updated at bedside Disposition Plan:  Status is: Inpatient  Remains inpatient appropriate because:Inpatient level of care appropriate due to severity of illness  Dispo: The patient is from: SNF              Anticipated d/c is to:  CIR              Patient currently is not medically stable to d/c.   Difficult to place patient No    Procedures:   Antimicrobials:    Subjective: -No events overnight, making incremental progress  Objective: Vitals:   12/15/20 0824 12/15/20 0938 12/15/20 0941 12/15/20 0944  BP: 131/89     Pulse: 93 85    Resp: 15 16    Temp: 97.6 F (36.4 C)     TempSrc: Oral     SpO2: 98% 99% 99% 99%  Weight:  Height:        Intake/Output Summary (Last 24 hours) at 12/15/2020 1407 Last data filed at 12/15/2020 1357 Gross per 24 hour  Intake 3576.83 ml  Output 675 ml  Net 2901.83 ml   Filed Weights   12/13/20 0446 12/14/20 0410 12/15/20 0425  Weight: 51.9 kg 52.8 kg 52.5 kg    Examination:  General exam: Elderly pleasant female laying in bed, flat affect, awake alert, expressive aphasia CVS: S1-S2, regular rate rhythm Lungs:  Decreased breath sounds to bases Abdomen: Soft, nontender, bowel sounds present, PEG tube noted Extremities: No edema Neuro: Expressive aphasia, right hemiplegia Skin: No rashes on exposed skin Psychiatry: Unable to assess    Data Reviewed:   CBC: Recent Labs  Lab 12/12/20 1201 12/13/20 0240 12/14/20 0250 12/15/20 0221  WBC 19.0* 12.0* 13.1* 10.3  NEUTROABS 17.2* 10.9*  --   --   HGB 14.5 12.7 12.1 12.5  HCT 45.8 39.8 38.8 39.4  MCV 100.0 99.5 99.5 99.0  PLT 227 206 214 215   Basic Metabolic Panel: Recent Labs  Lab 12/12/20 1201 12/13/20 0240 12/14/20 0250  NA 138 137 135  K 4.6 4.0 3.8  CL 101 98 99  CO2 29 29 28   GLUCOSE 117* 116* 109*  BUN 34* 39* 43*  CREATININE 0.68 0.74 0.62  CALCIUM 8.9 8.5* 8.2*   GFR: Estimated Creatinine Clearance: 50.3 mL/min (by C-G formula based on SCr of 0.62 mg/dL). Liver Function Tests: Recent Labs  Lab 12/12/20 1201  AST 35  ALT 48*  ALKPHOS 83  BILITOT 1.3*  PROT 6.5  ALBUMIN 2.9*   No results for input(s): LIPASE, AMYLASE in the last 168 hours. No results for input(s): AMMONIA in the last 168 hours. Coagulation Profile: Recent Labs  Lab 12/12/20 1249  INR 1.7*   Cardiac Enzymes: No results for input(s): CKTOTAL, CKMB, CKMBINDEX, TROPONINI in the last 168 hours. BNP (last 3 results) No results for input(s): PROBNP in the last 8760 hours. HbA1C: No results for input(s): HGBA1C in the last 72 hours. CBG: Recent Labs  Lab 12/14/20 2044 12/15/20 0025 12/15/20 0433 12/15/20 0725 12/15/20 1143  GLUCAP 104* 102* 121* 137* 132*   Lipid Profile: No results for input(s): CHOL, HDL, LDLCALC, TRIG, CHOLHDL, LDLDIRECT in the last 72 hours. Thyroid Function Tests: No results for input(s): TSH, T4TOTAL, FREET4, T3FREE, THYROIDAB in the last 72 hours. Anemia Panel: No results for input(s): VITAMINB12, FOLATE, FERRITIN, TIBC, IRON, RETICCTPCT in the last 72 hours. Urine analysis:    Component Value Date/Time    COLORURINE AMBER (A) 12/12/2020 2043   APPEARANCEUR HAZY (A) 12/12/2020 2043   LABSPEC 1.020 12/12/2020 2043   PHURINE 6.0 12/12/2020 2043   GLUCOSEU NEGATIVE 12/12/2020 2043   HGBUR NEGATIVE 12/12/2020 2043   BILIRUBINUR NEGATIVE 12/12/2020 2043   KETONESUR NEGATIVE 12/12/2020 2043   PROTEINUR 100 (A) 12/12/2020 2043   UROBILINOGEN 1.0 02/27/2015 1127   NITRITE NEGATIVE 12/12/2020 2043   LEUKOCYTESUR SMALL (A) 12/12/2020 2043   Sepsis Labs: @LABRCNTIP (procalcitonin:4,lacticidven:4)  ) Recent Results (from the past 240 hour(s))  SARS CORONAVIRUS 2 (TAT 6-24 HRS) Nasopharyngeal Nasopharyngeal Swab     Status: None   Collection Time: 12/05/20  4:58 PM   Specimen: Nasopharyngeal Swab  Result Value Ref Range Status   SARS Coronavirus 2 NEGATIVE NEGATIVE Final    Comment: (NOTE) SARS-CoV-2 target nucleic acids are NOT DETECTED.  The SARS-CoV-2 RNA is generally detectable in upper and lower respiratory specimens during the acute phase of infection. Negative  results do not preclude SARS-CoV-2 infection, do not rule out co-infections with other pathogens, and should not be used as the sole basis for treatment or other patient management decisions. Negative results must be combined with clinical observations, patient history, and epidemiological information. The expected result is Negative.  Fact Sheet for Patients: HairSlick.no  Fact Sheet for Healthcare Providers: quierodirigir.com  This test is not yet approved or cleared by the [place] FDA and  has been authorized for detection and/or diagnosis of SARS-CoV-2 by FDA under an Emergency Use Authorization (EUA). This EUA will remain  in effect (meaning this test can be used) for the duration of the COVID-19 declaration under Se ction 564(b)(1) of the Act, 21 U.S.C. section 360bbb-3(b)(1), unless the authorization is terminated or revoked sooner.  Performed at [facility], [address], [place], [place] [address]   Resp Panel by RT-PCR (Flu A&B, Covid) Nasopharyngeal Swab     Status: None   Collection Time: 12/12/20 11:39 AM   Specimen: Nasopharyngeal Swab; Nasopharyngeal(NP) swabs in vial transport medium  Result Value Ref Range Status   SARS Coronavirus 2 by RT PCR NEGATIVE NEGATIVE Final    Comment: (NOTE) SARS-CoV-2 target nucleic acids are NOT DETECTED.  The SARS-CoV-2 RNA is generally detectable in upper respiratory specimens during the acute phase of infection. The lowest concentration of SARS-CoV-2 viral copies this assay can detect is 138 copies/mL. A negative result does not preclude SARS-Cov-2 infection and should not be used as the sole basis for treatment or other patient management decisions. A negative result may occur with  improper specimen collection/handling, submission of specimen other than nasopharyngeal swab, presence of viral mutation(s) within the areas targeted by this assay, and inadequate number of viral copies(<138 copies/mL). A negative result must be combined with clinical observations, patient history, and epidemiological information. The expected result is Negative.  Fact Sheet for Patients:  BloggerCourse.com  Fact Sheet for Healthcare Providers:  SeriousBroker.it  This test is no t yet approved or cleared by the [place] FDA and  has been authorized for detection and/or diagnosis of SARS-CoV-2 by FDA under an Emergency Use Authorization (EUA). This EUA will remain  in effect (meaning this test can be used) for the duration of the COVID-19 declaration under Section 564(b)(1) of the Act, 21 U.S.C.section 360bbb-3(b)(1), unless the authorization is terminated  or revoked sooner.       Influenza A by PCR NEGATIVE NEGATIVE Final   Influenza B by PCR NEGATIVE NEGATIVE Final    Comment: (NOTE) The Xpert Xpress SARS-CoV-2/FLU/RSV plus assay is  intended as an aid in the diagnosis of influenza from Nasopharyngeal swab specimens and should not be used as a sole basis for treatment. Nasal washings and aspirates are unacceptable for Xpert Xpress SARS-CoV-2/FLU/RSV testing.  Fact Sheet for Patients: BloggerCourse.com  Fact Sheet for Healthcare Providers: SeriousBroker.it  This test is not yet approved or cleared by the [place] FDA and has been authorized for detection and/or diagnosis of SARS-CoV-2 by FDA under an Emergency Use Authorization (EUA). This EUA will remain in effect (meaning this test can be used) for the duration of the COVID-19 declaration under Section 564(b)(1) of the Act, 21 U.S.C. section 360bbb-3(b)(1), unless the authorization is terminated or revoked.  Performed at [facility], [address], [place], [place] [address]   Blood Culture (routine x 2)     Status: None (Preliminary result)   Collection Time: 12/12/20 12:49 PM   Specimen: BLOOD  Result Value Ref Range Status   Specimen Description BLOOD SITE NOT SPECIFIED  Final   Special Requests   Final    BOTTLES DRAWN AEROBIC AND ANAEROBIC Blood Culture results may not be optimal due to an inadequate volume of blood received in culture bottles   Culture   Final    NO GROWTH 3 DAYS Performed at [facility], [address] [place], [place] [address]    Report Status PENDING  Incomplete  Blood Culture (routine x 2)     Status: None (Preliminary result)   Collection Time: 12/12/20 12:58 PM   Specimen: BLOOD  Result Value Ref Range Status   Specimen Description BLOOD RIGHT ANTECUBITAL  Final   Special Requests   Final    BOTTLES DRAWN AEROBIC AND ANAEROBIC Blood Culture adequate volume   Culture   Final    NO GROWTH 3 DAYS Performed at [facility], [address] [place], [place] [address]    Report Status PENDING  Incomplete  Urine Culture     Status: Abnormal    Collection Time: 12/12/20  8:43 PM   Specimen: In/Out Cath Urine  Result Value Ref Range Status   Specimen Description IN/OUT CATH URINE  Final   Special Requests   Final    NONE Performed at [facility], [address] [place], [place] [address]    Culture (A)  Final    >=100,000 COLONIES/mL ENTEROCOCCUS FAECALIS 10,000 COLONIES/mL PSEUDOMONAS AERUGINOSA    Report Status 12/15/2020 FINAL  Final   Organism ID, Bacteria ENTEROCOCCUS FAECALIS (A)  Final   Organism ID, Bacteria PSEUDOMONAS AERUGINOSA (A)  Final      Susceptibility   Enterococcus faecalis - MIC*    AMPICILLIN <=2 SENSITIVE Sensitive     NITROFURANTOIN <=16 SENSITIVE Sensitive     VANCOMYCIN 2 SENSITIVE Sensitive     * >=100,000 COLONIES/mL ENTEROCOCCUS FAECALIS   Pseudomonas aeruginosa - MIC*    CEFTAZIDIME >=64 RESISTANT Resistant     CIPROFLOXACIN 0.5 SENSITIVE Sensitive     GENTAMICIN <=1 SENSITIVE Sensitive     IMIPENEM 1 SENSITIVE Sensitive     * 10,000 COLONIES/mL PSEUDOMONAS AERUGINOSA     Radiology Studies: No results found.  Scheduled Meds:  amiodarone  200 mg Per Tube Daily   apixaban  5 mg Per Tube BID   arformoterol  15 mcg Nebulization BID   budesonide  0.5 mg Nebulization BID   buPROPion  75 mg Per Tube BID   ezetimibe  10 mg Per Tube Daily   feeding supplement (PROSource TF)  45 mL Per Tube BID   free water  200 mL Per Tube Q4H   furosemide  20 mg Per Tube Daily   insulin aspart  0-9 Units Subcutaneous Q4H   mouth rinse  15 mL Mouth Rinse BID   metoprolol tartrate  12.5 mg Per Tube BID   pantoprazole sodium  40 mg Per Tube QHS   potassium chloride  20 mEq Per Tube Daily   revefenacin  175 mcg Nebulization Daily   Continuous Infusions:  cefTRIAXone (ROCEPHIN)  IV Stopped (12/15/20 1326)   feeding supplement (JEVITY 1.2 CAL) 55 mL/hr (12/14/20 2045)   metronidazole Stopped (12/15/20 0953)     LOS: 3 days    Time spent: 25min  Zannie CovePreetha Reginal Wojcicki, MD [facility]   12/15/2020, 2:07 PM

## 2020-12-16 LAB — BASIC METABOLIC PANEL
Anion gap: 6 (ref 5–15)
BUN: 32 mg/dL — ABNORMAL HIGH (ref 8–23)
CO2: 29 mmol/L (ref 22–32)
Calcium: 8.7 mg/dL — ABNORMAL LOW (ref 8.9–10.3)
Chloride: 102 mmol/L (ref 98–111)
Creatinine, Ser: 0.56 mg/dL (ref 0.44–1.00)
GFR, Estimated: >60 mL/min (ref >60)
Glucose, Bld: 129 mg/dL — ABNORMAL HIGH (ref 70–99)
Potassium: 4.5 mmol/L (ref 3.5–5.1)
Sodium: 137 mmol/L (ref 135–145)

## 2020-12-16 LAB — CBC
HCT: 38.5 % (ref 36.0–46.0)
Hemoglobin: 12 g/dL (ref 12.0–15.0)
MCH: 30.8 pg (ref 26.0–34.0)
MCHC: 31.2 g/dL (ref 30.0–36.0)
MCV: 99 fL (ref 80.0–100.0)
Platelets: 211 10*3/uL (ref 150–400)
RBC: 3.89 MIL/uL (ref 3.87–5.11)
RDW: 17.2 % — ABNORMAL HIGH (ref 11.5–15.5)
WBC: 8.5 10*3/uL (ref 4.0–10.5)
nRBC: 0 % (ref 0.0–0.2)

## 2020-12-16 LAB — GLUCOSE, CAPILLARY
Glucose-Capillary: 111 mg/dL — ABNORMAL HIGH (ref 70–99)
Glucose-Capillary: 126 mg/dL — ABNORMAL HIGH (ref 70–99)
Glucose-Capillary: 131 mg/dL — ABNORMAL HIGH (ref 70–99)
Glucose-Capillary: 133 mg/dL — ABNORMAL HIGH (ref 70–99)
Glucose-Capillary: 137 mg/dL — ABNORMAL HIGH (ref 70–99)
Glucose-Capillary: 160 mg/dL — ABNORMAL HIGH (ref 70–99)

## 2020-12-16 MED ORDER — CEFDINIR 250 MG/5ML PO SUSR
300.0000 mg | Freq: Two times a day (BID) | ORAL | Status: AC
  Administered 2020-12-16 (×2): 300 mg
  Filled 2020-12-16 (×2): qty 6

## 2020-12-16 NOTE — Progress Notes (Signed)
PROGRESS NOTE    Beverly Li  IWP:[id number] DOB: 1945/09/19 DOA: 12/12/2020 PCP: Clayborn Heron, MD  Brief Narrative:Beverly Li is a 75 y.o. 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 -She is currently at [facility], when her husband left her side on 8/29 she had been doing well and had been working with speech therapy and taking sips of sugar water.  However, once he returned 8/30 morning noted that the patient was having labored breathing.  She had not been on any other oral foods and had been receiving all nutrition through her PEG tube.   -In the emergency room she was febrile to 101.3, tachypneic, tachycardic and hypoxic requiring 4 L of O2, WBC was 19 K, chest x-ray noted right pleural effusion with worsening right basilar opacity   Assessment & Plan:  Aspiration pneumonia Sepsis, poa  -Multiple antibiotic allergies noted, clinically improving on IV ceftriaxone and Flagyl -Changed to cefdinir via PEG  -SLP assessment appreciated -Blood cultures are negative -PT OT following, CIR recommended -Discharge planning  Acute respiratory failure with hypoxia  -COPD, without exacerbation:  -Previously discharged to SNF on 1 L O2, currently on 3 L  -Chest x-ray and previous imaging noted severe emphysema, now with aspiration pneumonia  -As above    Paroxysmal atrial fibrillation  -Continue Eliquis, metoprolol, and amiodarone   Diastolic CHF: Acute on chronic.   - noted to have worsening right-sided pleural effusion.  BNP elevated at 970.9.  Last echocardiogram revealed EF of 60 - 65%.   -Require diuretics last admission as well, given IV Lasix x1 on admission -continue PO lasix -bmp in am   Essential hypertension:   -Blood pressure is soft earlier this admission, hydralazine held, continue metoprolol 12.5 mg twice daily -Continue to trend   Recent left MCA, PCA stroke with right hemiplegia, aphasia, dysphagia  -Patient had been receiving therapies at [facility] after left MCA stroke status post thrombectomy. -PT/OT/speech to evaluate and treat  Abnormal UA and Cultures -unable to assess symptoms, improving on Abx for Asp PNA, monitor clinically   Dysphagia: Secondary to recent stroke PEG placed on 8/19. -Continue tube feeds with free water flushes   Anxiety -Continue Wellbutrin   Right femoral popliteal bypass graft stenosis -Patient previously recommended to follow-up with vascular surgery in 2 to 3 months   GERD -Continue protonix   DVT prophylaxis: Eliquis Code Status: Full Family Communication: Husband updated at bedside Disposition Plan:  Status is: Inpatient  Remains inpatient appropriate because:Inpatient level of care appropriate due to severity of illness  Dispo: The patient is from: SNF              Anticipated d/c is to:  CIR              Patient currently is not medically stable to d/c.   Difficult to place patient No    Procedures:   Antimicrobials:    Subjective: -No events overnight  Objective: Vitals:   12/15/20 2045 12/16/20 0353 12/16/20 0752 12/16/20 0844  BP:  140/89 (!) 165/84 (!) 165/84  Pulse:  86 98 94  Resp:  18 18   Temp:  98.3 F (36.8 C) 98 F (36.7 C)   TempSrc:  Oral Axillary   SpO2: 100% 97% 95%   Weight:  54 kg    Height:        Intake/Output Summary (Last 24 hours) at 12/16/2020 1137 Last data filed at 12/16/2020 0353 Gross per 24 hour  Intake 1521 ml  Output 1700 ml  Net -179 ml   Filed Weights   12/14/20 0410 12/15/20 0425 12/16/20 0353  Weight: 52.8 kg 52.5 kg 54 kg    Examination:  General exam: Pleasant elderly female lying in bed, flat affect, awake alert, expressive aphasia CVS: S1-S2, regular rate rhythm Lungs:  Decreased breath sounds bases Abdomen: Soft, nontender, bowel sounds present, PEG tube noted with abdominal binder Extremities: No edema Neuro: Expressive aphasia, right hemiplegia Skin: No rashes on exposed skin Psychiatry: Unable to assess    Data Reviewed:   CBC: Recent Labs  Lab 12/12/20 1201 12/13/20 0240 12/14/20 0250 12/15/20 0221 12/16/20 0127  WBC 19.0* 12.0* 13.1* 10.3 8.5  NEUTROABS 17.2* 10.9*  --   --   --   HGB 14.5 12.7 12.1 12.5 12.0  HCT 45.8 39.8 38.8 39.4 38.5  MCV 100.0 99.5 99.5 99.0 99.0  PLT 227 206 214 215 211   Basic Metabolic Panel: Recent Labs  Lab 12/12/20 1201 12/13/20 0240 12/14/20 0250 12/16/20 0127  NA 138 137 135 137  K 4.6 4.0 3.8 4.5  CL 101 98 99 102  CO2 29 29 28 29   GLUCOSE 117* 116* 109* 129*  BUN 34* 39* 43* 32*  CREATININE 0.68 0.74 0.62 0.56  CALCIUM 8.9 8.5* 8.2* 8.7*   GFR: Estimated Creatinine Clearance: 50.3 mL/min (by C-G formula based on SCr of 0.56 mg/dL). Liver Function Tests: Recent Labs  Lab 12/12/20 1201  AST 35  ALT 48*  ALKPHOS 83  BILITOT 1.3*  PROT 6.5  ALBUMIN 2.9*   No results for input(s): LIPASE, AMYLASE in the last 168 hours. No results for input(s): AMMONIA in the last 168 hours. Coagulation Profile: Recent Labs  Lab 12/12/20 1249  INR 1.7*   Cardiac Enzymes: No results for input(s): CKTOTAL, CKMB, CKMBINDEX, TROPONINI in the last 168 hours. BNP (last 3 results) No results for input(s): PROBNP in the last 8760 hours. HbA1C: No results for input(s): HGBA1C in the last 72 hours. CBG: Recent Labs  Lab 12/15/20 1604 12/15/20 2030 12/16/20 0009 12/16/20 0349 12/16/20 0750  GLUCAP 145* 98 133* 131* 126*   Lipid Profile: No results for input(s): CHOL, HDL, LDLCALC, TRIG, CHOLHDL, LDLDIRECT in the last 72 hours. Thyroid Function Tests: No results for input(s): TSH, T4TOTAL, FREET4, T3FREE, THYROIDAB in the last 72 hours. Anemia Panel: No results for input(s): VITAMINB12, FOLATE,  FERRITIN, TIBC, IRON, RETICCTPCT in the last 72 hours. Urine analysis:    Component Value Date/Time   COLORURINE AMBER (A) 12/12/2020 2043   APPEARANCEUR HAZY (A) 12/12/2020 2043   LABSPEC 1.020 12/12/2020 2043   PHURINE 6.0 12/12/2020 2043   GLUCOSEU NEGATIVE 12/12/2020 2043   HGBUR NEGATIVE 12/12/2020 2043   BILIRUBINUR NEGATIVE 12/12/2020 2043   KETONESUR NEGATIVE 12/12/2020 2043   PROTEINUR 100 (A) 12/12/2020 2043   UROBILINOGEN 1.0 02/27/2015 1127   NITRITE NEGATIVE 12/12/2020 2043   LEUKOCYTESUR SMALL (A) 12/12/2020 2043   Sepsis Labs: @LABRCNTIP (procalcitonin:4,lacticidven:4)  ) Recent Results (from the past 240 hour(s))  Resp Panel by RT-PCR (Flu A&B, Covid) Nasopharyngeal Swab     Status: None   Collection Time: 12/12/20 11:39 AM   Specimen: Nasopharyngeal Swab; Nasopharyngeal(NP) swabs in vial transport medium  Result Value Ref Range Status   SARS Coronavirus 2 by RT PCR  NEGATIVE NEGATIVE Final    Comment: (NOTE) SARS-CoV-2 target nucleic acids are NOT DETECTED.  The SARS-CoV-2 RNA is generally detectable in upper respiratory specimens during the acute phase of infection. The lowest concentration of SARS-CoV-2 viral copies this assay can detect is 138 copies/mL. A negative result does not preclude SARS-Cov-2 infection and should not be used as the sole basis for treatment or other patient management decisions. A negative result may occur with  improper specimen collection/handling, submission of specimen other than nasopharyngeal swab, presence of viral mutation(s) within the areas targeted by this assay, and inadequate number of viral copies(<138 copies/mL). A negative result must be combined with clinical observations, patient history, and epidemiological information. The expected result is Negative.  Fact Sheet for Patients:  BloggerCourse.com  Fact Sheet for Healthcare Providers:  SeriousBroker.it  This  test is no t yet approved or cleared by the [place] FDA and  has been authorized for detection and/or diagnosis of SARS-CoV-2 by FDA under an Emergency Use Authorization (EUA). This EUA will remain  in effect (meaning this test can be used) for the duration of the COVID-19 declaration under Section 564(b)(1) of the Act, 21 U.S.C.section 360bbb-3(b)(1), unless the authorization is terminated  or revoked sooner.       Influenza A by PCR NEGATIVE NEGATIVE Final   Influenza B by PCR NEGATIVE NEGATIVE Final    Comment: (NOTE) The Xpert Xpress SARS-CoV-2/FLU/RSV plus assay is intended as an aid in the diagnosis of influenza from Nasopharyngeal swab specimens and should not be used as a sole basis for treatment. Nasal washings and aspirates are unacceptable for Xpert Xpress SARS-CoV-2/FLU/RSV testing.  Fact Sheet for Patients: BloggerCourse.com  Fact Sheet for Healthcare Providers: SeriousBroker.it  This test is not yet approved or cleared by the [place] FDA and has been authorized for detection and/or diagnosis of SARS-CoV-2 by FDA under an Emergency Use Authorization (EUA). This EUA will remain in effect (meaning this test can be used) for the duration of the COVID-19 declaration under Section 564(b)(1) of the Act, 21 U.S.C. section 360bbb-3(b)(1), unless the authorization is terminated or revoked.  Performed at [facility], [address] [place], [place] [address]   Blood Culture (routine x 2)     Status: None (Preliminary result)   Collection Time: 12/12/20 12:49 PM   Specimen: BLOOD  Result Value Ref Range Status   Specimen Description BLOOD SITE NOT SPECIFIED  Final   Special Requests   Final    BOTTLES DRAWN AEROBIC AND ANAEROBIC Blood Culture results may not be optimal due to an inadequate volume of blood received in culture bottles   Culture   Final    NO GROWTH 4 DAYS Performed at [facility]  Lab, [address] [place], [place] [address]    Report Status PENDING  Incomplete  Blood Culture (routine x 2)     Status: None (Preliminary result)   Collection Time: 12/12/20 12:58 PM   Specimen: BLOOD  Result Value Ref Range Status   Specimen Description BLOOD RIGHT ANTECUBITAL  Final   Special Requests   Final    BOTTLES DRAWN AEROBIC AND ANAEROBIC Blood Culture adequate volume   Culture   Final    NO GROWTH 4 DAYS Performed at [facility], [address] [place], [place] [address]    Report Status PENDING  Incomplete  Urine Culture     Status: Abnormal   Collection Time: 12/12/20  8:43 PM   Specimen: In/Out  Cath Urine  Result Value Ref Range Status   Specimen Description IN/OUT CATH URINE  Final   Special Requests   Final    NONE Performed at [facility], [address] [place], [place] [address]    Culture (A)  Final    >=100,000 COLONIES/mL ENTEROCOCCUS FAECALIS 10,000 COLONIES/mL PSEUDOMONAS AERUGINOSA    Report Status 12/15/2020 FINAL  Final   Organism ID, Bacteria ENTEROCOCCUS FAECALIS (A)  Final   Organism ID, Bacteria PSEUDOMONAS AERUGINOSA (A)  Final      Susceptibility   Enterococcus faecalis - MIC*    AMPICILLIN <=2 SENSITIVE Sensitive     NITROFURANTOIN <=16 SENSITIVE Sensitive     VANCOMYCIN 2 SENSITIVE Sensitive     * >=100,000 COLONIES/mL ENTEROCOCCUS FAECALIS   Pseudomonas aeruginosa - MIC*    CEFTAZIDIME >=64 RESISTANT Resistant     CIPROFLOXACIN 0.5 SENSITIVE Sensitive     GENTAMICIN <=1 SENSITIVE Sensitive     IMIPENEM 1 SENSITIVE Sensitive     * 10,000 COLONIES/mL PSEUDOMONAS AERUGINOSA     Radiology Studies: No results found.  Scheduled Meds:  amiodarone  200 mg Per Tube Daily   apixaban  5 mg Per Tube BID   arformoterol  15 mcg Nebulization BID   budesonide  0.5 mg Nebulization BID   buPROPion  75 mg Per Tube BID   ezetimibe  10 mg Per Tube Daily   feeding supplement (PROSource TF)  45 mL Per Tube BID   free water  200 mL  Per Tube Q4H   furosemide  20 mg Per Tube Daily   insulin aspart  0-9 Units Subcutaneous Q4H   mouth rinse  15 mL Mouth Rinse BID   metoprolol tartrate  12.5 mg Per Tube BID   pantoprazole sodium  40 mg Per Tube QHS   potassium chloride  20 mEq Per Tube Daily   revefenacin  175 mcg Nebulization Daily   Continuous Infusions:  cefTRIAXone (ROCEPHIN)  IV Stopped (12/15/20 1326)   feeding supplement (JEVITY 1.2 CAL) 55 mL/hr (12/15/20 1845)   metronidazole 500 mg (12/15/20 2150)     LOS: 4 days    Time spent: 25min  Zannie CovePreetha Ed Rayson, MD [facility]   12/16/2020, 11:37 AM

## 2020-12-17 ENCOUNTER — Encounter (HOSPITAL_COMMUNITY): Payer: Self-pay | Admitting: Internal Medicine

## 2020-12-17 ENCOUNTER — Other Ambulatory Visit: Payer: Self-pay

## 2020-12-17 LAB — CULTURE, BLOOD (ROUTINE X 2)
Culture: NO GROWTH
Culture: NO GROWTH
Special Requests: ADEQUATE

## 2020-12-17 LAB — GLUCOSE, CAPILLARY
Glucose-Capillary: 102 mg/dL — ABNORMAL HIGH (ref 70–99)
Glucose-Capillary: 122 mg/dL — ABNORMAL HIGH (ref 70–99)
Glucose-Capillary: 131 mg/dL — ABNORMAL HIGH (ref 70–99)
Glucose-Capillary: 131 mg/dL — ABNORMAL HIGH (ref 70–99)
Glucose-Capillary: 133 mg/dL — ABNORMAL HIGH (ref 70–99)
Glucose-Capillary: 136 mg/dL — ABNORMAL HIGH (ref 70–99)
Glucose-Capillary: 93 mg/dL (ref 70–99)

## 2020-12-17 MED ORDER — FUROSEMIDE 20 MG PO TABS
20.0000 mg | ORAL_TABLET | Freq: Two times a day (BID) | ORAL | Status: DC
  Administered 2020-12-17: 20 mg
  Filled 2020-12-17 (×2): qty 1

## 2020-12-17 NOTE — Progress Notes (Signed)
PROGRESS NOTE    DALEISA HALPERIN  VEL:[id number] DOB: Jun 26, 1945 DOA: 12/12/2020 PCP: Clayborn Heron, MD  Brief Narrative:Alice B Bribiesca is a 75 y.o. 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 -She is currently at [facility], when her husband left her side on 8/29 she had been doing well and had been working with speech therapy and taking sips of sugar water.  However, once he returned 8/30 morning noted that the patient was having labored breathing.  She had not been on any other oral foods and had been receiving all nutrition through her PEG tube.   -In the emergency room she was febrile to 101.3, tachypneic, tachycardic and hypoxic requiring 4 L of O2, WBC was 19 K, chest x-ray noted right pleural effusion with worsening right basilar opacity   Assessment & Plan:  Aspiration pneumonia Sepsis, poa  -Multiple antibiotic allergies noted, clinically improving on IV ceftriaxone and Flagyl -Changed to cefdinir via PEG  -SLP assessment appreciated -Blood cultures are negative -PT OT following, CIR recommended -Discharge to CIR when bed available  Acute respiratory failure with hypoxia  -COPD, without exacerbation:  -Previously discharged to SNF on 1 L O2, currently on 3 L  -Chest x-ray and previous imaging noted severe emphysema, now with aspiration pneumonia  -As above    Paroxysmal atrial fibrillation  -Continue Eliquis, metoprolol, and amiodarone   Diastolic CHF: Acute on chronic.   - noted to have worsening right-sided pleural effusion.  BNP elevated at 970.9.  Last echocardiogram revealed EF of 60 - 65%.   -Require diuretics last admission as well, given IV Lasix x1 on admission -Continue p.o. Lasix, increased dose to 20 mg  twice daily, BMP in a.m.   Essential hypertension:  -Blood pressure is soft earlier this admission, hydralazine held, continue metoprolol 12.5 mg twice daily -Continue to trend   Recent left MCA, PCA stroke with right hemiplegia, aphasia, dysphagia  -Patient had been receiving therapies at [facility] after left MCA stroke status post thrombectomy. -PT/OT/speech to evaluate and treat  Abnormal UA and Cultures -unable to assess symptoms, improving on Abx for Asp PNA, monitor clinically   Dysphagia: Secondary to recent stroke PEG placed on 8/19. -Continue tube feeds with free water flushes   Anxiety -Continue Wellbutrin   Right femoral popliteal bypass graft stenosis -Patient previously recommended to follow-up with vascular surgery in 2 to 3 months   GERD -Continue protonix   DVT prophylaxis: Eliquis Code Status: Full Family Communication: Husband updated at bedside Disposition Plan:  Status is: Inpatient  Remains inpatient appropriate because:Inpatient level of care appropriate due to severity of illness  Dispo: The patient is from: SNF              Anticipated d/c is to:  CIR              Patient currently is medically stable to d/c.   Difficult to place patient No   Subjective: -Feels okay, no events overnight  Objective: Vitals:   12/17/20 0727 12/17/20 0728 12/17/20 0840 12/17/20 1000  BP:   (!) 140/93   Pulse:      Resp:   (!) 21   Temp:   98 F (36.7 C)   TempSrc:      SpO2: 98% 100%  97%  Weight:      Height:        Intake/Output Summary (Last 24 hours) at 12/17/2020 1133 Last data filed at 12/17/2020 7026 Gross per 24 hour  Intake --  Output 1500 ml  Net -1500 ml   Filed Weights   12/15/20 0425 12/16/20 0353 12/17/20 0423  Weight: 52.5 kg 54 kg 50.3 kg    Examination:  General exam: Pleasant elderly female laying in bed, awake alert, expressive aphasia CVS: S1-S2, regular rate rhythm Lungs: Decreased breath sounds the bases Abdomen: Soft,  nontender, PEG tube with abdominal binder Extremities: No edema Neuro: Expressive aphasia, right hemiplegia  Skin: No rashes on exposed skin Psychiatry: Unable to assess    Data Reviewed:   CBC: Recent Labs  Lab 12/12/20 1201 12/13/20 0240 12/14/20 0250 12/15/20 0221 12/16/20 0127  WBC 19.0* 12.0* 13.1* 10.3 8.5  NEUTROABS 17.2* 10.9*  --   --   --   HGB 14.5 12.7 12.1 12.5 12.0  HCT 45.8 39.8 38.8 39.4 38.5  MCV 100.0 99.5 99.5 99.0 99.0  PLT 227 206 214 215 211   Basic Metabolic Panel: Recent Labs  Lab 12/12/20 1201 12/13/20 0240 12/14/20 0250 12/16/20 0127  NA 138 137 135 137  K 4.6 4.0 3.8 4.5  CL 101 98 99 102  CO2 29 29 28 29   GLUCOSE 117* 116* 109* 129*  BUN 34* 39* 43* 32*  CREATININE 0.68 0.74 0.62 0.56  CALCIUM 8.9 8.5* 8.2* 8.7*   GFR: Estimated Creatinine Clearance: 48.2 mL/min (by C-G formula based on SCr of 0.56 mg/dL). Liver Function Tests: Recent Labs  Lab 12/12/20 1201  AST 35  ALT 48*  ALKPHOS 83  BILITOT 1.3*  PROT 6.5  ALBUMIN 2.9*   No results for input(s): LIPASE, AMYLASE in the last 168 hours. No results for input(s): AMMONIA in the last 168 hours. Coagulation Profile: Recent Labs  Lab 12/12/20 1249  INR 1.7*   Cardiac Enzymes: No results for input(s): CKTOTAL, CKMB, CKMBINDEX, TROPONINI in the last 168 hours. BNP (last 3 results) No results for input(s): PROBNP in the last 8760 hours. HbA1C: No results for input(s): HGBA1C in the last 72 hours. CBG: Recent Labs  Lab 12/16/20 1516 12/16/20 2039 12/17/20 0008 12/17/20 0416 12/17/20 0808  GLUCAP 111* 160* 131* 102* 133*   Lipid Profile: No results for input(s): CHOL, HDL, LDLCALC, TRIG, CHOLHDL, LDLDIRECT in the last 72 hours. Thyroid Function Tests: No results for input(s): TSH, T4TOTAL, FREET4, T3FREE, THYROIDAB in the last 72 hours. Anemia Panel: No results for input(s): VITAMINB12, FOLATE, FERRITIN, TIBC, IRON, RETICCTPCT in the last 72 hours. Urine  analysis:    Component Value Date/Time   COLORURINE AMBER (A) 12/12/2020 2043   APPEARANCEUR HAZY (A) 12/12/2020 2043   LABSPEC 1.020 12/12/2020 2043   PHURINE 6.0 12/12/2020 2043   GLUCOSEU NEGATIVE 12/12/2020 2043   HGBUR NEGATIVE 12/12/2020 2043   BILIRUBINUR NEGATIVE 12/12/2020 2043   KETONESUR NEGATIVE 12/12/2020 2043   PROTEINUR 100 (A) 12/12/2020 2043   UROBILINOGEN 1.0 02/27/2015 1127   NITRITE NEGATIVE 12/12/2020 2043   LEUKOCYTESUR SMALL (A) 12/12/2020 2043   Sepsis Labs: @LABRCNTIP (procalcitonin:4,lacticidven:4)  ) Recent Results (from the past 240 hour(s))  Resp Panel by RT-PCR (Flu A&B, Covid) Nasopharyngeal Swab     Status: None   Collection Time: 12/12/20 11:39 AM   Specimen: Nasopharyngeal Swab; Nasopharyngeal(NP) swabs in vial transport medium  Result Value Ref Range Status   SARS Coronavirus 2 by RT PCR NEGATIVE NEGATIVE Final  Comment: (NOTE) SARS-CoV-2 target nucleic acids are NOT DETECTED.  The SARS-CoV-2 RNA is generally detectable in upper respiratory specimens during the acute phase of infection. The lowest concentration of SARS-CoV-2 viral copies this assay can detect is 138 copies/mL. A negative result does not preclude SARS-Cov-2 infection and should not be used as the sole basis for treatment or other patient management decisions. A negative result may occur with  improper specimen collection/handling, submission of specimen other than nasopharyngeal swab, presence of viral mutation(s) within the areas targeted by this assay, and inadequate number of viral copies(<138 copies/mL). A negative result must be combined with clinical observations, patient history, and epidemiological information. The expected result is Negative.  Fact Sheet for Patients:  BloggerCourse.com  Fact Sheet for Healthcare Providers:  SeriousBroker.it  This test is no t yet approved or cleared by the [place] FDA  and  has been authorized for detection and/or diagnosis of SARS-CoV-2 by FDA under an Emergency Use Authorization (EUA). This EUA will remain  in effect (meaning this test can be used) for the duration of the COVID-19 declaration under Section 564(b)(1) of the Act, 21 U.S.C.section 360bbb-3(b)(1), unless the authorization is terminated  or revoked sooner.       Influenza A by PCR NEGATIVE NEGATIVE Final   Influenza B by PCR NEGATIVE NEGATIVE Final    Comment: (NOTE) The Xpert Xpress SARS-CoV-2/FLU/RSV plus assay is intended as an aid in the diagnosis of influenza from Nasopharyngeal swab specimens and should not be used as a sole basis for treatment. Nasal washings and aspirates are unacceptable for Xpert Xpress SARS-CoV-2/FLU/RSV testing.  Fact Sheet for Patients: BloggerCourse.com  Fact Sheet for Healthcare Providers: SeriousBroker.it  This test is not yet approved or cleared by the [place] FDA and has been authorized for detection and/or diagnosis of SARS-CoV-2 by FDA under an Emergency Use Authorization (EUA). This EUA will remain in effect (meaning this test can be used) for the duration of the COVID-19 declaration under Section 564(b)(1) of the Act, 21 U.S.C. section 360bbb-3(b)(1), unless the authorization is terminated or revoked.  Performed at [facility], [address] [place], [place] [address]   Blood Culture (routine x 2)     Status: None   Collection Time: 12/12/20 12:49 PM   Specimen: BLOOD  Result Value Ref Range Status   Specimen Description BLOOD SITE NOT SPECIFIED  Final   Special Requests   Final    BOTTLES DRAWN AEROBIC AND ANAEROBIC Blood Culture results may not be optimal due to an inadequate volume of blood received in culture bottles   Culture   Final    NO GROWTH 5 DAYS Performed at [facility], [address] [place], [place] [address]    Report Status 12/17/2020 FINAL   Final  Blood Culture (routine x 2)     Status: None   Collection Time: 12/12/20 12:58 PM   Specimen: BLOOD  Result Value Ref Range Status   Specimen Description BLOOD RIGHT ANTECUBITAL  Final   Special Requests   Final    BOTTLES DRAWN AEROBIC AND ANAEROBIC Blood Culture adequate volume   Culture   Final    NO GROWTH 5 DAYS Performed at [facility], [address], [place], [place] [address]    Report Status 12/17/2020 FINAL  Final  Urine Culture     Status: Abnormal   Collection Time: 12/12/20  8:43 PM   Specimen: In/Out Cath Urine  Result Value Ref Range Status  Specimen Description IN/OUT CATH URINE  Final   Special Requests   Final    NONE Performed at [facility], [address] [place], [place] [address]    Culture (A)  Final    >=100,000 COLONIES/mL ENTEROCOCCUS FAECALIS 10,000 COLONIES/mL PSEUDOMONAS AERUGINOSA    Report Status 12/15/2020 FINAL  Final   Organism ID, Bacteria ENTEROCOCCUS FAECALIS (A)  Final   Organism ID, Bacteria PSEUDOMONAS AERUGINOSA (A)  Final      Susceptibility   Enterococcus faecalis - MIC*    AMPICILLIN <=2 SENSITIVE Sensitive     NITROFURANTOIN <=16 SENSITIVE Sensitive     VANCOMYCIN 2 SENSITIVE Sensitive     * >=100,000 COLONIES/mL ENTEROCOCCUS FAECALIS   Pseudomonas aeruginosa - MIC*    CEFTAZIDIME >=64 RESISTANT Resistant     CIPROFLOXACIN 0.5 SENSITIVE Sensitive     GENTAMICIN <=1 SENSITIVE Sensitive     IMIPENEM 1 SENSITIVE Sensitive     * 10,000 COLONIES/mL PSEUDOMONAS AERUGINOSA     Radiology Studies: No results found.  Scheduled Meds:  amiodarone  200 mg Per Tube Daily   apixaban  5 mg Per Tube BID   arformoterol  15 mcg Nebulization BID   budesonide  0.5 mg Nebulization BID   buPROPion  75 mg Per Tube BID   ezetimibe  10 mg Per Tube Daily   feeding supplement (PROSource TF)  45 mL Per Tube BID   free water  200 mL Per Tube Q4H   furosemide  20 mg Per Tube Daily   insulin aspart  0-9 Units Subcutaneous  Q4H   mouth rinse  15 mL Mouth Rinse BID   metoprolol tartrate  12.5 mg Per Tube BID   pantoprazole sodium  40 mg Per Tube QHS   potassium chloride  20 mEq Per Tube Daily   revefenacin  175 mcg Nebulization Daily   Continuous Infusions:  feeding supplement (JEVITY 1.2 CAL) 55 mL/hr (12/15/20 1845)     LOS: 5 days    Time spent: 25min  Zannie CovePreetha Eva Vallee, MD [facility]   12/17/2020, 11:33 AM

## 2020-12-18 LAB — BASIC METABOLIC PANEL
Anion gap: 7 (ref 5–15)
BUN: 28 mg/dL — ABNORMAL HIGH (ref 8–23)
CO2: 33 mmol/L — ABNORMAL HIGH (ref 22–32)
Calcium: 9.3 mg/dL (ref 8.9–10.3)
Chloride: 98 mmol/L (ref 98–111)
Creatinine, Ser: 0.59 mg/dL (ref 0.44–1.00)
GFR, Estimated: >60 mL/min (ref >60)
Glucose, Bld: 127 mg/dL — ABNORMAL HIGH (ref 70–99)
Potassium: 4.4 mmol/L (ref 3.5–5.1)
Sodium: 138 mmol/L (ref 135–145)

## 2020-12-18 LAB — GLUCOSE, CAPILLARY
Glucose-Capillary: 130 mg/dL — ABNORMAL HIGH (ref 70–99)
Glucose-Capillary: 127 mg/dL — ABNORMAL HIGH (ref 70–99)
Glucose-Capillary: 136 mg/dL — ABNORMAL HIGH (ref 70–99)
Glucose-Capillary: 88 mg/dL (ref 70–99)
Glucose-Capillary: 94 mg/dL (ref 70–99)

## 2020-12-18 MED ORDER — FREE WATER
300.0000 mL | Status: DC
  Administered 2020-12-18 – 2020-12-19 (×6): 300 mL

## 2020-12-18 MED ORDER — JEVITY 1.2 CAL PO LIQD
237.0000 mL | Freq: Four times a day (QID) | ORAL | Status: DC
  Administered 2020-12-18 – 2020-12-19 (×3): 237 mL
  Filled 2020-12-18 (×6): qty 237

## 2020-12-18 NOTE — Progress Notes (Signed)
Initial Nutrition Assessment  DOCUMENTATION CODES:   Severe malnutrition in context of chronic illness  INTERVENTION:   Tube Feeding via PEG:  Change to bolus feedings: goal of Jevity 1.2  6 cans per day which provides 1710 kcals, 79 g of protein and 1146 mL of free water Begin Jevity 1.2 237 mL (1 can) QID today  NUTRITION DIAGNOSIS:   Severe Malnutrition related to chronic illness as evidenced by severe fat depletion, severe muscle depletion.  GOAL:   Patient will meet greater than or equal to 90% of their needs  MONITOR:   TF tolerance, Labs, Weight trends, Skin  REASON FOR ASSESSMENT:   New TF    ASSESSMENT:   75 yo female admitted with acute respiratory failure with COPD, aspiration pneumonia. PMH stroke with PEG, HTN, CHF  Family at bedside. Pt sitting up in recliner.   NPO currently. Pt has been working with SLP as outpatient and has been trialing bites of po but not currently on an official "diet" as an outpatient  Tolerating Jevity 1.2 at 55 ml/hr with Pro-Source TF 45 mL BID via PEG with 200 mL free water q 4 hours  Current wt loss 50.4 kg; weight appears stable recently. Husband reports pt had an infection in her spine several years ago and lost weight then and has not ben able to regain  Labs: reviewed Meds: ss novolog, KCl, lasix  NUTRITION - FOCUSED PHYSICAL EXAM:  Flowsheet Row Most Recent Value  Orbital Region Moderate depletion  Upper Arm Region Severe depletion  Thoracic and Lumbar Region Severe depletion  Buccal Region Moderate depletion  Temple Region Moderate depletion  Clavicle Bone Region Severe depletion  Clavicle and Acromion Bone Region Severe depletion  Scapular Bone Region Severe depletion  Dorsal Hand Moderate depletion  [edema in R hand]  Patellar Region Severe depletion  Anterior Thigh Region Severe depletion  Posterior Calf Region Severe depletion  Edema (RD Assessment) Mild       Diet Order:   Diet Order              Diet NPO time specified  Diet effective now                   EDUCATION NEEDS:   Education needs have been addressed  Skin:  Skin Assessment: Skin Integrity Issues: Skin Integrity Issues:: Other (Comment) Other: old pressure injury  Last BM:  9/4  Height:   Ht Readings from Last 1 Encounters:  12/12/20 5\' 3"  (1.6 m)    Weight:   Wt Readings from Last 1 Encounters:  12/18/20 50.4 kg    BMI:  Body mass index is 19.7 kg/m.  Estimated Nutritional Needs:   Kcal:  1600-1800 kcals  Protein:  80-90g  Fluid:  >/= 1.6 L    02/17/21 MS, RDN, LDN, CNSC Registered Dietitian III Clinical Nutrition RD Pager and On-Call Pager Number Located in [place]

## 2020-12-18 NOTE — Progress Notes (Signed)
Physical Therapy Treatment Patient Details Name: Beverly Li MRN: [id number] DOB: Jan 14, 1946 Today's Date: 12/18/2020    History of Present Illness Pt is a 75 y.o. female recently d/c to [facility] for rehab after admission 11/13/20-12/06/20 for multiple infarcts requiring revascularization and intubation, now readmitted 12/12/20 with respiratory distress. CXR noted increased R pleural effusion, unchanged L pleural effusion. Workup for sepsis secondary to PNA; question possibility of bacterial pneumonia versus aspiration with possibility of compounding factor of CHF exacerbation. 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    PT Comments    Treated pt in conjunction with OT to maximize pt safety and quality of session. Focused session on providing neuromuscular re-education with functional mobility. Pt displays attempts to initiate movement with her R UE and lower extremity, but continued weakness and delay in response. Pt also displays some pushing with her L UE towards her R (more in sitting than in standing). Thus provided visual, verbal, and tactile cues to help pt identify, correct, and maintain midline in sitting and standing with good success. Pt able to transition sit to stand multiple times with stedy with minAx2 and maintain static standing with min guard-minA. She may be able to begin to attempt standing and gait training with 2 HHA soon. Will continue to follow acutely. Current recommendations remain appropriate.    Follow Up Recommendations  CIR;Supervision/Assistance - 24 hour     Equipment Recommendations  Wheelchair (measurements PT);Wheelchair cushion (measurements PT);Hospital bed;3in1 (PT);Other (comment) (lift equipment)    Recommendations for Other Services       Precautions /  Restrictions Precautions Precautions: Fall;Other (comment) Precaution Comments: R hemiplegia, PEG, adominal binder; bladder/bowel incontinence Restrictions Weight Bearing Restrictions: No    Mobility  Bed Mobility Overal bed mobility: Needs Assistance Bed Mobility: Rolling;Sidelying to Sit Rolling: Max assist Sidelying to sit: Mod assist;+2 for physical assistance       General bed mobility comments: Rolling either direction to place abdominal binder, maxA with cues to bring R UE onto chest and R knee into flexion, noted muscle initiation by pt. ModAx2 to transition sidelying > sit R EOB with cues to manage legs off EOB and sit up, good initiation for L leg but min initiation attempt on R.    Transfers Overall transfer level: Needs assistance Equipment used: Ambulation equipment used Transfers: Sit to/from Stand Sit to Stand: Min assist;+2 physical assistance;From elevated surface         General transfer comment: Sit to stand 1x from elevated EOB to stedy and 3x from stedy flaps, pillow placed at knees for comfort. Cues provided for hand placement and to pull up with UEs and extend knees to stand, minAx2 to boost up to stand. Hand-over-hand for R hand placement and maintenance on stedy bar.  Ambulation/Gait             General Gait Details: Unable this date.   Stairs             Wheelchair Mobility    Modified Rankin (Stroke Patients Only) Modified Rankin (Stroke Patients Only) Pre-Morbid Rankin Score: No symptoms Modified Rankin: Severe disability     Balance Overall balance assessment: Needs assistance Sitting-balance support: Single extremity supported;Feet supported Sitting balance-Leahy Scale: Poor Sitting balance - Comments: Pt reliant on L UE support on EOB or in stedy, ranging in needing min guard assist to modA depending  on level of pushing with L UE towards her R. Repeated cues provided to relax L shoulder and pull rather than push, cuing pt to  look in mirror at sink to identify and correct to maintain midline. TCs provided at ribs to improve R lateral extension of trunk. Postural control: Posterior lean Standing balance support: Bilateral upper extremity supported;During functional activity Standing balance-Leahy Scale: Poor Standing balance comment: Pt with hand-over-hand assistance to keep R hand on stedy bar, needing visual cues from mirror at sink intermittently and TCs and VCs to correct her slight push to R to find and maintain midline, ranging from min guard to intermittent minA to maintain static standing balance.                            Cognition Arousal/Alertness: Awake/alert Behavior During Therapy: Flat affect (intermittent smiles or rolling of the eyes) Overall Cognitive Status: Difficult to assess Area of Impairment: Following commands;Awareness;Problem solving;Safety/judgement;Attention                   Current Attention Level: Focused   Following Commands: Follows one step commands with increased time;Follows one step commands consistently;Follows multi-step commands inconsistently Safety/Judgement: Decreased awareness of safety;Decreased awareness of deficits Awareness: Intellectual Problem Solving: Slow processing;Difficulty sequencing;Decreased initiation;Requires verbal cues;Requires tactile cues General Comments: Pt with improved reception of cues, following simple multi-modal commands with her L UE consistently and attempts to follow with R, delayed response. Pt moaning a couple times but otherwise not attempting to verbalize much. Pt with flat affect but intermittent smiling to a joke or rolling eyes, showing some personality. Poor awareness into her deficits and safety, displaying some mild pushing to her R, needing repeated cues to correct.      Exercises      General Comments General comments (skin integrity, edema, etc.): Educated husband to have pt look at her R extremities when  attempting to move them to encourage neuromuscular re-education.      Pertinent Vitals/Pain Pain Assessment: Faces Faces Pain Scale: Hurts a little bit Pain Location: generalized grimacing with mobility Pain Descriptors / Indicators: Grimacing Pain Intervention(s): Limited activity within patient's tolerance;Monitored during session;Repositioned    Home Living                      Prior Function            PT Goals (current goals can now be found in the care plan section) Acute Rehab PT Goals Patient Stated Goal: pt did not state; family desires for pt to improve PT Goal Formulation: With patient/family Time For Goal Achievement: 12/27/20 Potential to Achieve Goals: Good Progress towards PT goals: Progressing toward goals    Frequency    Min 4X/week      PT Plan Current plan remains appropriate    Co-evaluation PT/OT/SLP Co-Evaluation/Treatment: Yes Reason for Co-Treatment: Complexity of the patient's impairments (multi-system involvement);For patient/therapist safety;To address functional/ADL transfers PT goals addressed during session: Mobility/safety with mobility;Balance;Proper use of DME;Strengthening/ROM        AM-PAC PT "6 Clicks" Mobility   Outcome Measure  Help needed turning from your back to your side while in a flat bed without using bedrails?: A Lot Help needed moving from lying on your back to sitting on the side of a flat bed without using bedrails?: A Lot Help needed moving to and from a bed to a chair (including a wheelchair)?: Total Help needed standing up from  a chair using your arms (e.g., wheelchair or bedside chair)?: A Lot Help needed to walk in hospital room?: Total Help needed climbing 3-5 steps with a railing? : Total 6 Click Score: 9    End of Session Equipment Utilized During Treatment: Gait belt;Oxygen Activity Tolerance: Patient tolerated treatment well Patient left: in chair;with call bell/phone within reach;with chair  alarm set Nurse Communication: Mobility status PT Visit Diagnosis: Muscle weakness (generalized) (M62.81);Difficulty in walking, not elsewhere classified (R26.2);Other symptoms and signs involving the nervous system (R29.898);Hemiplegia and hemiparesis;Unsteadiness on feet (R26.81) Hemiplegia - Right/Left: Right Hemiplegia - dominant/non-dominant: Dominant Hemiplegia - caused by: Cerebral infarction     Time: 5916-3846 PT Time Calculation (min) (ACUTE ONLY): 41 min  Charges:  $Neuromuscular Re-education: 23-37 mins                     Raymond Gurney, PT, DPT Acute Rehabilitation Services  Pager: [phone] Office: [phone]    Henrene Dodge Pettis 12/18/2020, 9:08 AM

## 2020-12-18 NOTE — Progress Notes (Signed)
PROGRESS NOTE    Beverly Li  NGE:[id number] DOB: Aug 14, 1945 DOA: 12/12/2020 PCP: Clayborn Heron, MD  Brief Narrative:Beverly Li is a 75 y.o. 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 -She is currently at [facility], when her husband left her side on 8/29 she had been doing well and had been working with speech therapy and taking sips of sugar water.  However, once he returned 8/30 morning noted that the patient was having labored breathing.  She had not been on any other oral foods and had been receiving all nutrition through her PEG tube.   -In the emergency room she was febrile to 101.3, tachypneic, tachycardic and hypoxic requiring 4 L of O2, WBC was 19 K, chest x-ray noted right pleural effusion with worsening right basilar opacity   Assessment & Plan:  Aspiration pneumonia Sepsis, poa  -Multiple antibiotic allergies noted, clinically improving on IV ceftriaxone and Flagyl -Changed to cefdinir via PEG  -SLP assessment appreciated -Blood cultures are negative -PT OT following, CIR recommended -Discharge planning, for CIR when bed available  Acute respiratory failure with hypoxia  -COPD, without exacerbation:  -Previously discharged to SNF on 1 L O2, currently on 3 L  -Chest x-ray and previous imaging noted severe emphysema, now with aspiration pneumonia  -As above    Paroxysmal atrial fibrillation  -Continue Eliquis, metoprolol, and amiodarone   Diastolic CHF: Acute on chronic.   - noted to have worsening right-sided pleural effusion.  BNP elevated at 970.9.  Last echocardiogram revealed EF of 60 - 65%.   -Require diuretics last admission as well, given IV Lasix x1 on admission -Was started on p.o. Lasix, blood  pressures soft today, will hold diuretics   Essential hypertension:  -Blood pressure is soft earlier this admission, hydralazine held,  -continue metoprolol 12.5 mg twice daily -Continue to trend   Recent left MCA, PCA stroke with right hemiplegia, aphasia, dysphagia  -Patient had been receiving therapies at [facility] after left MCA stroke status post thrombectomy. -PT/OT/speech following  Abnormal UA and Cultures -unable to assess symptoms, improving on Abx for Asp PNA, monitor clinically   Dysphagia: Secondary to recent stroke PEG placed on 8/19. -Continue tube feeds with free water flushes   Anxiety -Continue Wellbutrin   Right femoral popliteal bypass graft stenosis -Patient previously recommended to follow-up with vascular surgery in 2 to 3 months   GERD -Continue protonix   DVT prophylaxis: Eliquis Code Status: Full Family Communication: Husband updated at bedside Disposition Plan:  Status is: Inpatient  Remains inpatient appropriate because:Inpatient level of care appropriate due to severity of illness  Dispo: The patient is from: SNF              Anticipated d/c is to: Hopefully CIR              Patient currently is medically stable to d/c.   Difficult to place patient No   Subjective: -Feels okay, no events overnight, BP soft today, more tired  Objective: Vitals:   12/17/20 2025 12/18/20 0342 12/18/20 0746 12/18/20 0858  BP: (!) 146/91 (!) 134/91 117/78 (!) 88/68  Pulse: 78 97 97   Resp: 17 17 15 20   Temp: 97.7 F (36.5 C) 97.8 F (36.6 C) 97.8 F (36.6 C)   TempSrc: Oral  Oral Oral   SpO2: 97% 97% 97%   Weight:  50.4 kg    Height:        Intake/Output Summary (Last 24 hours) at 12/18/2020 1219 Last data filed at 12/18/2020 0441 Gross per 24 hour  Intake 1755 ml  Output 2000 ml  Net -245 ml   Filed Weights   12/16/20 0353 12/17/20 0423 12/18/20 0342  Weight: 54 kg 50.3 kg 50.4 kg    Examination:  General exam: Pleasant elderly female  laying in bed, awake alert, expressive aphasia CVS: S1-S2, regular rate rhythm Lungs: Decreased breath sounds to bases Abdomen: Soft, nontender, PEG tube, abdominal binder noted Extremities: No edema Neuro: Expressive aphasia, right hemiplegia  Skin: No rashes on exposed skin Psychiatry: Unable to assess    Data Reviewed:   CBC: Recent Labs  Lab 12/12/20 1201 12/13/20 0240 12/14/20 0250 12/15/20 0221 12/16/20 0127  WBC 19.0* 12.0* 13.1* 10.3 8.5  NEUTROABS 17.2* 10.9*  --   --   --   HGB 14.5 12.7 12.1 12.5 12.0  HCT 45.8 39.8 38.8 39.4 38.5  MCV 100.0 99.5 99.5 99.0 99.0  PLT 227 206 214 215 211   Basic Metabolic Panel: Recent Labs  Lab 12/12/20 1201 12/13/20 0240 12/14/20 0250 12/16/20 0127 12/18/20 0103  NA 138 137 135 137 138  K 4.6 4.0 3.8 4.5 4.4  CL 101 98 99 102 98  CO2 29 29 28 29  33*  GLUCOSE 117* 116* 109* 129* 127*  BUN 34* 39* 43* 32* 28*  CREATININE 0.68 0.74 0.62 0.56 0.59  CALCIUM 8.9 8.5* 8.2* 8.7* 9.3   GFR: Estimated Creatinine Clearance: 48.3 mL/min (by C-G formula based on SCr of 0.59 mg/dL). Liver Function Tests: Recent Labs  Lab 12/12/20 1201  AST 35  ALT 48*  ALKPHOS 83  BILITOT 1.3*  PROT 6.5  ALBUMIN 2.9*   No results for input(s): LIPASE, AMYLASE in the last 168 hours. No results for input(s): AMMONIA in the last 168 hours. Coagulation Profile: Recent Labs  Lab 12/12/20 1249  INR 1.7*   Cardiac Enzymes: No results for input(s): CKTOTAL, CKMB, CKMBINDEX, TROPONINI in the last 168 hours. BNP (last 3 results) No results for input(s): PROBNP in the last 8760 hours. HbA1C: No results for input(s): HGBA1C in the last 72 hours. CBG: Recent Labs  Lab 12/17/20 2040 12/17/20 2358 12/18/20 0416 12/18/20 0732 12/18/20 1147  GLUCAP 131* 122* 130* 127* 136*   Lipid Profile: No results for input(s): CHOL, HDL, LDLCALC, TRIG, CHOLHDL, LDLDIRECT in the last 72 hours. Thyroid Function Tests: No results for input(s): TSH,  T4TOTAL, FREET4, T3FREE, THYROIDAB in the last 72 hours. Anemia Panel: No results for input(s): VITAMINB12, FOLATE, FERRITIN, TIBC, IRON, RETICCTPCT in the last 72 hours. Urine analysis:    Component Value Date/Time   COLORURINE AMBER (A) 12/12/2020 2043   APPEARANCEUR HAZY (A) 12/12/2020 2043   LABSPEC 1.020 12/12/2020 2043   PHURINE 6.0 12/12/2020 2043   GLUCOSEU NEGATIVE 12/12/2020 2043   HGBUR NEGATIVE 12/12/2020 2043   BILIRUBINUR NEGATIVE 12/12/2020 2043   KETONESUR NEGATIVE 12/12/2020 2043   PROTEINUR 100 (A) 12/12/2020 2043   UROBILINOGEN 1.0 02/27/2015 1127   NITRITE NEGATIVE 12/12/2020 2043   LEUKOCYTESUR SMALL (A) 12/12/2020 2043   Sepsis Labs: @LABRCNTIP (procalcitonin:4,lacticidven:4)  ) Recent Results (from the past 240 hour(s))  Resp Panel by RT-PCR (Flu A&B, Covid) Nasopharyngeal Swab     Status: None   Collection Time: 12/12/20 11:39 AM   Specimen: Nasopharyngeal Swab; Nasopharyngeal(NP) swabs  in vial transport medium  Result Value Ref Range Status   SARS Coronavirus 2 by RT PCR NEGATIVE NEGATIVE Final    Comment: (NOTE) SARS-CoV-2 target nucleic acids are NOT DETECTED.  The SARS-CoV-2 RNA is generally detectable in upper respiratory specimens during the acute phase of infection. The lowest concentration of SARS-CoV-2 viral copies this assay can detect is 138 copies/mL. A negative result does not preclude SARS-Cov-2 infection and should not be used as the sole basis for treatment or other patient management decisions. A negative result may occur with  improper specimen collection/handling, submission of specimen other than nasopharyngeal swab, presence of viral mutation(s) within the areas targeted by this assay, and inadequate number of viral copies(<138 copies/mL). A negative result must be combined with clinical observations, patient history, and epidemiological information. The expected result is Negative.  Fact Sheet for Patients:   BloggerCourse.com  Fact Sheet for Healthcare Providers:  SeriousBroker.it  This test is no t yet approved or cleared by the [place] FDA and  has been authorized for detection and/or diagnosis of SARS-CoV-2 by FDA under an Emergency Use Authorization (EUA). This EUA will remain  in effect (meaning this test can be used) for the duration of the COVID-19 declaration under Section 564(b)(1) of the Act, 21 U.S.C.section 360bbb-3(b)(1), unless the authorization is terminated  or revoked sooner.       Influenza A by PCR NEGATIVE NEGATIVE Final   Influenza B by PCR NEGATIVE NEGATIVE Final    Comment: (NOTE) The Xpert Xpress SARS-CoV-2/FLU/RSV plus assay is intended as an aid in the diagnosis of influenza from Nasopharyngeal swab specimens and should not be used as a sole basis for treatment. Nasal washings and aspirates are unacceptable for Xpert Xpress SARS-CoV-2/FLU/RSV testing.  Fact Sheet for Patients: BloggerCourse.com  Fact Sheet for Healthcare Providers: SeriousBroker.it  This test is not yet approved or cleared by the [place] FDA and has been authorized for detection and/or diagnosis of SARS-CoV-2 by FDA under an Emergency Use Authorization (EUA). This EUA will remain in effect (meaning this test can be used) for the duration of the COVID-19 declaration under Section 564(b)(1) of the Act, 21 U.S.C. section 360bbb-3(b)(1), unless the authorization is terminated or revoked.  Performed at [facility], [address] [place], [place] [address]   Blood Culture (routine x 2)     Status: None   Collection Time: 12/12/20 12:49 PM   Specimen: BLOOD  Result Value Ref Range Status   Specimen Description BLOOD SITE NOT SPECIFIED  Final   Special Requests   Final    BOTTLES DRAWN AEROBIC AND ANAEROBIC Blood Culture results may not be optimal due to an inadequate  volume of blood received in culture bottles   Culture   Final    NO GROWTH 5 DAYS Performed at [facility], [address] [place], [place] [address]    Report Status 12/17/2020 FINAL  Final  Blood Culture (routine x 2)     Status: None   Collection Time: 12/12/20 12:58 PM   Specimen: BLOOD  Result Value Ref Range Status   Specimen Description BLOOD RIGHT ANTECUBITAL  Final   Special Requests   Final    BOTTLES DRAWN AEROBIC AND ANAEROBIC Blood Culture adequate volume   Culture   Final    NO GROWTH 5 DAYS Performed at [facility], [address], [place], [place] [address]    Report Status 12/17/2020 FINAL  Final  Urine Culture  Status: Abnormal   Collection Time: 12/12/20  8:43 PM   Specimen: In/Out Cath Urine  Result Value Ref Range Status   Specimen Description IN/OUT CATH URINE  Final   Special Requests   Final    NONE Performed at [facility], [address] [place], [place] [address]    Culture (A)  Final    >=100,000 COLONIES/mL ENTEROCOCCUS FAECALIS 10,000 COLONIES/mL PSEUDOMONAS AERUGINOSA    Report Status 12/15/2020 FINAL  Final   Organism ID, Bacteria ENTEROCOCCUS FAECALIS (A)  Final   Organism ID, Bacteria PSEUDOMONAS AERUGINOSA (A)  Final      Susceptibility   Enterococcus faecalis - MIC*    AMPICILLIN <=2 SENSITIVE Sensitive     NITROFURANTOIN <=16 SENSITIVE Sensitive     VANCOMYCIN 2 SENSITIVE Sensitive     * >=100,000 COLONIES/mL ENTEROCOCCUS FAECALIS   Pseudomonas aeruginosa - MIC*    CEFTAZIDIME >=64 RESISTANT Resistant     CIPROFLOXACIN 0.5 SENSITIVE Sensitive     GENTAMICIN <=1 SENSITIVE Sensitive     IMIPENEM 1 SENSITIVE Sensitive     * 10,000 COLONIES/mL PSEUDOMONAS AERUGINOSA     Radiology Studies: No results found.  Scheduled Meds:  amiodarone  200 mg Per Tube Daily   apixaban  5 mg Per Tube BID   arformoterol  15 mcg Nebulization BID   budesonide  0.5 mg Nebulization BID   buPROPion  75 mg Per Tube BID    ezetimibe  10 mg Per Tube Daily   feeding supplement (PROSource TF)  45 mL Per Tube BID   free water  300 mL Per Tube Q4H   insulin aspart  0-9 Units Subcutaneous Q4H   mouth rinse  15 mL Mouth Rinse BID   metoprolol tartrate  12.5 mg Per Tube BID   pantoprazole sodium  40 mg Per Tube QHS   potassium chloride  20 mEq Per Tube Daily   revefenacin  175 mcg Nebulization Daily   Continuous Infusions:  feeding supplement (JEVITY 1.2 CAL) 55 mL/hr (12/18/20 0432)     LOS: 6 days    Time spent:  Zannie Cove, MD [facility]   12/18/2020, 12:19 PM

## 2020-12-18 NOTE — Progress Notes (Signed)
Occupational Therapy Treatment Patient Details Name: Beverly Li MRN: [id number] DOB: May 11, 1945 Today's Date: 12/18/2020    History of present illness Pt is a 75 y.o. female recently d/c to [facility] for rehab after admission 11/13/20-12/06/20 for multiple infarcts requiring revascularization and intubation, now readmitted 12/12/20 with respiratory distress. CXR noted increased R pleural effusion, unchanged L pleural effusion. Workup for sepsis secondary to PNA; question possibility of bacterial pneumonia versus aspiration with possibility of compounding factor of CHF exacerbation. 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   OT comments  Pt is progressing well towards her OT goals. She demonstrated great ability to follow simple directional verbal cues throughout the session, and intermittently answered yes/no questions. Pt required total A hand over hand assist to groom while sitting at the sink with use of her R arm. Focused on neuro re-ed with all functional tasks. Pt fluctuated between close min guard and min A for sitting balance with use of steady frame. Pt continued to benefit from OT acutely. D/c recommendation remains appropriate.    Follow Up Recommendations  CIR    Equipment Recommendations  3 in 1 bedside commode;Wheelchair (measurements OT);Wheelchair cushion (measurements OT);Hospital bed    Recommendations for Other Services Rehab consult    Precautions / Restrictions Precautions Precautions: Fall;Other (comment) Precaution Comments: R hemiplegia, PEG, adominal binder; bladder/bowel incontinence Restrictions Weight Bearing Restrictions: No       Mobility Bed Mobility Overal bed mobility: Needs Assistance Bed Mobility: Rolling;Sidelying to Sit Rolling: +2 for physical  assistance;+2 for safety/equipment;Max assist Sidelying to sit: Mod assist;+2 for physical assistance;+2 for safety/equipment       General bed mobility comments: Rolling either direction to place abdominal binder, maxA with cues to bring R UE onto chest and R knee into flexion, noted muscle initiation by pt. ModAx2 to transition sidelying > sit R EOB with cues to manage legs off EOB and sit up, good initiation for L leg but min initiation attempt on R.    Transfers Overall transfer level: Needs assistance Equipment used: Ambulation equipment used Transfers: Sit to/from Stand Sit to Stand: Min assist;+2 physical assistance;From elevated surface         General transfer comment: Sit to stand 1x from elevated EOB to stedy and 3x from stedy flaps, pillow placed at knees for comfort. Cues provided for hand placement and to pull up with UEs and extend knees to stand, minAx2 to boost up to stand. Hand-over-hand for R hand placement and maintenance on stedy bar.    Balance Overall balance assessment: Needs assistance Sitting-balance support: Single extremity supported;Feet supported Sitting balance-Leahy Scale: Poor Sitting balance - Comments: Pt reliant on L UE support on EOB or in stedy, ranging in needing min guard assist to modA depending on level of pushing with L UE towards her R. Repeated cues provided to relax L shoulder and pull rather than push, cuing pt to look in mirror at sink to identify and correct to maintain midline. TCs provided at ribs to improve R lateral extension of trunk. Postural control: Posterior lean Standing balance support: Bilateral upper extremity supported;During functional activity Standing balance-Leahy Scale: Poor Standing balance comment: Pt with hand-over-hand assistance to keep R hand on stedy bar, needing visual cues from mirror at sink intermittently and TCs and VCs to correct her slight push to R to find and maintain midline, ranging  from min guard to  intermittent minA to maintain static standing balance.                           ADL either performed or assessed with clinical judgement   ADL Overall ADL's : Needs assistance/impaired     Grooming: Sitting;Maximal assistance Grooming Details (indicate cue type and reason): sitting on staady at the sink. incrased assist this session due to hand over hand use of R hand to complete grooming tasks. vc for looking into mirror and lookign at R hand during task                             Functional mobility during ADLs: Minimal assistance;Total assistance (min A +2 for sit<>stand with use of steady frame - total A for transfers with steady) General ADL Comments: pt required incrased cueing and time for processing for all ADL tasks      Cognition Arousal/Alertness: Awake/alert Behavior During Therapy: Flat affect Overall Cognitive Status: Difficult to assess Area of Impairment: Following commands;Awareness;Problem solving;Safety/judgement;Attention                   Current Attention Level: Focused   Following Commands: Follows one step commands with increased time;Follows one step commands consistently;Follows multi-step commands inconsistently Safety/Judgement: Decreased awareness of safety;Decreased awareness of deficits Awareness: Intellectual Problem Solving: Slow processing;Difficulty sequencing;Decreased initiation;Requires verbal cues;Requires tactile cues General Comments: Pt with improved reception of cues, following simple multi-modal commands with her L UE consistently and attempts to follow with R, delayed response. Pt moaning a couple times but otherwise not attempting to verbalize much. Pt with flat affect but intermittent smiling to a joke or rolling eyes, showing some personality. Poor awareness into her deficits and safety, displaying some mild pushing to her R, needing repeated cues to correct.              General Comments Pt answered  some yes/no questions throughout session, follow simple directional commands well and gave therapists emotional expressions with her eyes    Pertinent Vitals/ Pain       Pain Assessment: Faces Faces Pain Scale: Hurts a little bit Pain Location: generalized grimacing with mobility Pain Descriptors / Indicators: Grimacing Pain Intervention(s): Limited activity within patient's tolerance         Frequency  Min 2X/week        Progress Toward Goals  OT Goals(current goals can now be found in the care plan section)  Progress towards OT goals: Progressing toward goals  Acute Rehab OT Goals Patient Stated Goal: pt did not state; family desires for pt to improve OT Goal Formulation: With patient/family Time For Goal Achievement: 12/27/20 Potential to Achieve Goals: Fair ADL Goals Pt Will Perform Grooming: sitting;with min assist Pt Will Perform Upper Body Bathing: with min assist;sitting Pt Will Perform Upper Body Dressing: with mod assist;sitting Pt Will Transfer to Toilet: with mod assist;bedside commode;squat pivot transfer Additional ADL Goal #1: Pt will sustain attention during simple ADL with Min cues Additional ADL Goal #2: Pt will maintain static sitting at EOB with Min Guard A for 5 minutes in preparation for ADLs  Plan Discharge plan needs to be updated    Co-evaluation    PT/OT/SLP Co-Evaluation/Treatment: Yes Reason for Co-Treatment: Complexity of the patient's impairments (multi-system involvement);Necessary to address cognition/behavior during functional activity;For patient/therapist safety;To address functional/ADL transfers PT goals addressed during session: Mobility/safety with mobility;Balance;Proper  use of DME;Strengthening/ROM OT goals addressed during session: ADL's and self-care;Proper use of Adaptive equipment and DME      AM-PAC OT "6 Clicks" Daily Activity     Outcome Measure   Help from another person eating meals?: Total Help from another person  taking care of personal grooming?: A Lot Help from another person toileting, which includes using toliet, bedpan, or urinal?: Total Help from another person bathing (including washing, rinsing, drying)?: Total Help from another person to put on and taking off regular upper body clothing?: Total Help from another person to put on and taking off regular lower body clothing?: Total 6 Click Score: 7    End of Session Equipment Utilized During Treatment: Gait belt;Oxygen (steady)  OT Visit Diagnosis: Unsteadiness on feet (R26.81);Muscle weakness (generalized) (M62.81);Hemiplegia and hemiparesis Hemiplegia - Right/Left: Right Hemiplegia - dominant/non-dominant: Dominant Hemiplegia - caused by: Cerebral infarction   Activity Tolerance Patient tolerated treatment well   Patient Left in chair;with call bell/phone within reach;with chair alarm set;with family/visitor present   Nurse Communication Mobility status        Time: 4097-3532 OT Time Calculation (min): 40 min  Charges: OT General Charges $OT Visit: 1 Visit OT Treatments $Self Care/Home Management : 8-22 mins     Latasha Buczkowski A Nardos Putnam 12/18/2020, 9:58 AM

## 2020-12-18 NOTE — Care Management Important Message (Signed)
Important Message  Patient Details  Name: Beverly Li MRN: [id number] Date of Birth: 07/30/1945   Medicare Important Message Given:  Yes     Renie Ora 12/18/2020, 10:24 AM

## 2020-12-18 NOTE — Progress Notes (Signed)
Inpatient Rehabilitation Admissions Coordinator   Fransico Him has requested peer to peer with treating MD. I have notified Dr Jomarie Longs to call to [phone] option 5 by 12 noon on 8/6 per [name]. We will follow up after that determination.  Ottie Glazier, RN, MSN Rehab Admissions Coordinator [phone] 12/18/2020 12:14 PM

## 2020-12-19 LAB — BASIC METABOLIC PANEL
Anion gap: 6 (ref 5–15)
BUN: 30 mg/dL — ABNORMAL HIGH (ref 8–23)
CO2: 31 mmol/L (ref 22–32)
Calcium: 8.9 mg/dL (ref 8.9–10.3)
Chloride: 100 mmol/L (ref 98–111)
Creatinine, Ser: 0.56 mg/dL (ref 0.44–1.00)
GFR, Estimated: >60 mL/min (ref >60)
Glucose, Bld: 71 mg/dL (ref 70–99)
Potassium: 4.4 mmol/L (ref 3.5–5.1)
Sodium: 137 mmol/L (ref 135–145)

## 2020-12-19 LAB — GLUCOSE, CAPILLARY
Glucose-Capillary: 128 mg/dL — ABNORMAL HIGH (ref 70–99)
Glucose-Capillary: 134 mg/dL — ABNORMAL HIGH (ref 70–99)
Glucose-Capillary: 140 mg/dL — ABNORMAL HIGH (ref 70–99)
Glucose-Capillary: 78 mg/dL (ref 70–99)
Glucose-Capillary: 87 mg/dL (ref 70–99)
Glucose-Capillary: 97 mg/dL (ref 70–99)

## 2020-12-19 LAB — CBC
HCT: 43.1 % (ref 36.0–46.0)
Hemoglobin: 13.5 g/dL (ref 12.0–15.0)
MCH: 31 pg (ref 26.0–34.0)
MCHC: 31.3 g/dL (ref 30.0–36.0)
MCV: 98.9 fL (ref 80.0–100.0)
Platelets: 228 10*3/uL (ref 150–400)
RBC: 4.36 MIL/uL (ref 3.87–5.11)
RDW: 17.2 % — ABNORMAL HIGH (ref 11.5–15.5)
WBC: 9.9 10*3/uL (ref 4.0–10.5)
nRBC: 0 % (ref 0.0–0.2)

## 2020-12-19 MED ORDER — JEVITY 1.2 CAL PO LIQD
356.0000 mL | Freq: Four times a day (QID) | ORAL | Status: DC
  Administered 2020-12-19 – 2020-12-21 (×8): 356 mL
  Filled 2020-12-19 (×11): qty 474

## 2020-12-19 MED ORDER — FREE WATER
200.0000 mL | Freq: Four times a day (QID) | Status: DC
  Administered 2020-12-19 – 2020-12-21 (×8): 200 mL

## 2020-12-19 NOTE — Progress Notes (Signed)
Inpatient Rehab Admissions Coordinator:   Dr. Jomarie Longs completed peer to peer this morning, but denial was upheld.  Received notice from [facility] case manager that there was an option for expedited appeal and I have faxed the information for this as of 1:50 today.  Would expect an answer by mid day Friday at the latest.   Estill Dooms, PT, DPT Admissions Coordinator [phone] 12/19/20  1:52 PM

## 2020-12-19 NOTE — Progress Notes (Signed)
Physical Therapy Treatment Patient Details Name: Beverly Li MRN: [id number] DOB: Feb 04, 1946 Today's Date: 12/19/2020    History of Present Illness Pt is a 75 y.o. female recently d/c to [facility] for rehab after admission 11/13/20-12/06/20 for multiple infarcts requiring revascularization and intubation, now readmitted 12/12/20 with respiratory distress. CXR noted increased R pleural effusion, unchanged L pleural effusion. Workup for sepsis secondary to PNA; question possibility of bacterial pneumonia versus aspiration with possibility of compounding factor of CHF exacerbation. 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    PT Comments    Patient received in bed, cooperative with therapy and able to communicate somewhat non-verbally. Tried lots of novel tasks today with 2 person assist, and able to actually progress to gait training and transfer training. Trialed both 2 person HHA and 3 musketeers technique, seemed to do better with 3 musketeers. 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 Left up in recliner with all needs met, chair alarm active. Continue to feel she would really do well at CIR.     Follow Up Recommendations  CIR;Supervision/Assistance - 24 hour     Equipment Recommendations  Wheelchair (measurements PT);Wheelchair cushion (measurements PT);Hospital bed;3in1 (PT);Other (comment) (lift equipment)    Recommendations for Other Services       Precautions / Restrictions Precautions Precautions: Fall;Other (comment) Precaution Comments: R hemiplegia with R shoulder subluxation, PEG, adominal binder; bladder/bowel incontinence Restrictions Weight Bearing Restrictions: No    Mobility  Bed Mobility Overal bed mobility: Needs Assistance Bed  Mobility: Supine to Sit Rolling: Max assist   Supine to sit: HOB elevated     General bed mobility comments: able to get to EOB today with maxA, HOB elevated, and use of rail as well as extended time; difficulty with initiation and definite motor apraxia    Transfers Overall transfer level: Needs assistance Equipment used: 2 person hand held assist Transfers: Sit to/from [facility] Sit to Stand: Mod assist;+2 safety/equipment Stand pivot transfers: Max assist;+2 physical assistance       General transfer comment: able to perform multiple sit to stands with ModAx1/second person providing min guard for safety, did need cues for sequencing and extra time for processing. Able to perform stand pivot with MaxAx2 and 3 musketeers technique with maxA/max multimodal cues for sequencing/safety. Had quite a bit of difficulty with sequencing novel task due to apraxia.  Ambulation/Gait Ambulation/Gait assistance: Max assist;+2 physical assistance Gait Distance (Feet): 4 Feet (30fx2) Assistive device: 2 person hand held assist Gait Pattern/deviations: Step-to pattern;Decreased weight shift to right;Decreased weight shift to left;Trunk flexed;Narrow base of support Gait velocity: decreased   General Gait Details: maxAx2 and heavy multimodal cues for sequencing; had quite a bit of difficulty sequencing weight shifts and needed MaxA to progress R LE however able to initiate muscle contraction in her right leg. Needed frequent cues for upright posture as well.   Stairs             Wheelchair Mobility    Modified Rankin (Stroke Patients Only)       Balance Overall balance assessment: Needs assistance Sitting-balance support: Single extremity supported;Feet supported Sitting balance-Leahy Scale: Poor Sitting balance - Comments: tends to lean to her left side and needed cues to correct, also tends to lean posteriorly and needed cues for core activation as well. Easily  fatigued  and needed visual targets to improve upright posture. Postural control: Posterior lean;Left lateral lean Standing balance support: Bilateral upper extremity supported;During functional activity Standing balance-Leahy Scale: Poor Standing balance comment: 2 person HHA to maintain balance dynamically                            Cognition Arousal/Alertness: Awake/alert Behavior During Therapy: Flat affect Overall Cognitive Status: Difficult to assess Area of Impairment: Following commands;Awareness;Problem solving;Safety/judgement;Attention                   Current Attention Level: Focused   Following Commands: Follows one step commands consistently;Follows multi-step commands inconsistently;Follows one step commands inconsistently Safety/Judgement: Decreased awareness of safety;Decreased awareness of deficits Awareness: Intellectual Problem Solving: Slow processing;Difficulty sequencing;Decreased initiation;Requires verbal cues;Requires tactile cues General Comments: difficult to assess cognition today due to aphasia and pt being new to therapist. Able to follow simple multimodal commands about 75% of the time but with delayed response. Able to communicate non-verbally somewhat with body language today. Definite apraxia as well as poor insight into deficits      Exercises      General Comments General comments (skin integrity, edema, etc.): difficult to communicate wtih especially when trying novel tasks, but able to follow commands and communicate somewhat non verbally      Pertinent Vitals/Pain Pain Assessment: Faces Faces Pain Scale: Hurts a little bit Pain Location: generalized grimacing with mobility Pain Descriptors / Indicators: Grimacing Pain Intervention(s): Limited activity within patient's tolerance;Monitored during session    Home Living                      Prior Function            PT Goals (current goals can now be found in  the care plan section) Acute Rehab PT Goals Patient Stated Goal: pt did not state; family desires for pt to improve PT Goal Formulation: With patient/family Time For Goal Achievement: 12/27/20 Potential to Achieve Goals: Good Progress towards PT goals: Progressing toward goals    Frequency    Min 4X/week      PT Plan Current plan remains appropriate    Co-evaluation              AM-PAC PT "6 Clicks" Mobility   Outcome Measure  Help needed turning from your back to your side while in a flat bed without using bedrails?: A Lot Help needed moving from lying on your back to sitting on the side of a flat bed without using bedrails?: A Lot Help needed moving to and from a bed to a chair (including a wheelchair)?: Total Help needed standing up from a chair using your arms (e.g., wheelchair or bedside chair)?: A Lot Help needed to walk in hospital room?: Total Help needed climbing 3-5 steps with a railing? : Total 6 Click Score: 9    End of Session Equipment Utilized During Treatment: Gait belt;Oxygen Activity Tolerance: Patient tolerated treatment well Patient left: in chair;with call bell/phone within reach;with chair alarm set;with family/visitor present Nurse Communication: Mobility status PT Visit Diagnosis: Muscle weakness (generalized) (M62.81);Difficulty in walking, not elsewhere classified (R26.2);Other symptoms and signs involving the nervous system (R29.898);Hemiplegia and hemiparesis;Unsteadiness on feet (R26.81) Hemiplegia - Right/Left: Right Hemiplegia - dominant/non-dominant: Dominant Hemiplegia - caused by: Cerebral infarction     Time: 8144-8185 PT Time Calculation (min) (ACUTE ONLY): 28 min  Charges:  $Gait Training: 8-22 mins $Neuromuscular Re-education: 8-22 mins  Windell Norfolk, DPT, PN2   Supplemental Physical Therapist [place]    Pager [phone] Acute Rehab Office [phone]

## 2020-12-19 NOTE — H&P (Signed)
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  She was discharged to skilled nursing facility.  Per chart review prior to CVA August 2022 patient lives with spouse two-level home bed and bath main level.  She was independent without DME.  Husband planning discharged to home with hired caregivers.  Presented 12/12/2020 with labored breathing as well as low-grade fever.  Noted admission chemistries WBC 19,000 BUN 34 troponin high-sensitivity 173.  Chest x-ray noted increased right pleural effusion with worsening opacity in the right lung base with unchanged left pleural effusion.  Placed on broad-spectrum antibiotics.  Blood cultures remain negative.  She continued on Eliquis.  N.p.o. with PEG tube feeds ongoing as prior to admission.  Antibiotic therapy has been completed.  Therapy evaluations completed due to patient decreased functional ability history of CVA was admitted for a comprehensive rehab program.   Pt nonverbal, but according to hsuabd/son, her LBM was yesterday/today; not clear if she's having any pain- hasn't been taking any tylenol.  Using purewick for voiding- incontinence. Of B/B.  Said "she passed MBS". But doesn't know exactly how.     Review of Systems  Unable to perform ROS: Acuity of condition  Past Medical History:  Diagnosis Date   Anxiety    Arthritis    "some in my lower back; probably elbows, knees" (11/18/2017)    Atrial fibrillation (HCC)    Bell's palsy    when pt. was 75 yrs old, when under stress the left side of face will droop.   Complication of anesthesia    "vascular OR 2016; BP bottomed out; couldn't get it regulated; ended up in ICU for DAYS" (11/18/2017)   GERD (gastroesophageal reflux disease)    History of kidney stones    Hypertension    Hypertrophic cardiomyopathy (HCC)    severe LV basilar hypertrophy witn no evidence of significant outflow tract obstruction, EF 65-70%, mild LAE, mild TR, grade 1a diastolic dysfunction 05/15/10 (Dr. Donato SchultzMark Skains) (Atrial Septal Hypertrophy pattern)-- Intra-op TEE with dsignificant outflow tract obstruction - AI, MR & TR   Insomnia    Mild aortic sclerosis    Osteopenia    Peripheral vascular disease (HCC)    Syncope    , Vagal   Past Surgical History:  Procedure Laterality Date   AUGMENTATION MAMMAPLASTY Bilateral    BACK SURGERY     CARDIAC CATHETERIZATION N/A 05/07/2016   Procedure: Left Heart Cath and Coronary Angiography;  Surgeon: Kathleene Hazelhristopher D McAlhany, MD;  [facility];  Service: Cardiovascular;  Laterality: N/A;   CARDIOVERSION N/A 09/24/2017   Procedure: CARDIOVERSION;  Surgeon: Laurey MoraleMcLean, Dalton S, MD;  [facility];  Service: Cardiovascular;  Laterality: N/A;   DILATION AND CURETTAGE OF UTERUS     ENDARTERECTOMY FEMORAL Right 03/02/2015   Procedure: ENDARTERECTOMY RIGHT FEMORAL;  Surgeon: Chuck Hinthristopher S Dickson, MD;  [facility];  Service: Vascular;  Laterality: Right;   ESOPHAGOGASTRODUODENOSCOPY (EGD) WITH PROPOFOL N/A 12/01/2020   Procedure: ESOPHAGOGASTRODUODENOSCOPY (EGD) WITH PROPOFOL;  Surgeon: Diamantina MonksLovick, Ayesha N,  MD;  Location: [facility] ENDOSCOPY;  Service: General;  Laterality: N/A;   FACIAL COSMETIC SURGERY Left 2002   "related to Bell's Palsy @ age 76; left eye/side of face droopy; tried to make area symmetrical"   FEMORAL-POPLITEAL BYPASS GRAFT Right 03/02/2015   Procedure: BYPASS GRAFT FEMORAL-BELOW KNEE  POPLITEAL ARTERY;  Surgeon: Chuck Hint, MD;  Location: [facility] OR;  Service: Vascular;  Laterality: Right;   INGUINAL HERNIA REPAIR Bilateral 2002   IR ANGIO INTRA EXTRACRAN SEL COM CAROTID INNOMINATE UNI L MOD SED  11/16/2020   IR CT HEAD LTD  11/13/2020   IR PERCUTANEOUS ART THROMBECTOMY/INFUSION INTRACRANIAL INC DIAG ANGIO  11/13/2020   OVARIAN CYST REMOVAL Left    PEG PLACEMENT N/A 12/01/2020   Procedure: PERCUTANEOUS ENDOSCOPIC GASTROSTOMY (PEG) PLACEMENT;  Surgeon: Diamantina Monks, MD;  Location: [facility] ENDOS[facility];  Service: General;  Laterality: N/A;   PERIPHERAL VASCULAR CATHETERIZATION N/A 01/16/2015   Procedure: Abdominal Aortogram;  Surgeon: Chuck Hint, MD;  Location: [facility] INVASIVE CV LAB;  Service: Cardiovascular;  Laterality: N/A;   POSTERIOR LUMBAR FUSION  2015   "have plates and screws in there"   RADIOLOGY WITH ANESTHESIA N/A 11/13/2020   Procedure: IR WITH ANESTHESIA;  Surgeon: Radiologist, Medication, MD;  Location: [facility] OR;  Service: Radiology;  Laterality: N/A;   TONSILLECTOMY     Family History  Problem Relation Age of Onset   Liver cancer Mother    Cancer Mother        Liver   Hypertension Mother    Lung cancer Father    Cancer Father        Lung   Breast cancer Sister    Cancer Sister        Breast   Social History:  reports that she quit smoking about 6 years ago. Her smoking use included cigarettes. She has a 50.00 pack-year smoking history. She has never used smokeless tobacco. She reports current alcohol use. She reports that she does not use drugs. Allergies:  Allergies  Allergen Reactions   Amoxicillin Other (See Comments)    UTI Has patient had a PCN reaction causing immediate rash, facial/tongue/throat swelling, SOB or lightheadedness with hypotension: No Has patient had a PCN reaction causing severe rash involving mucus membranes or skin necrosis: No Has patient had a PCN reaction that required hospitalization: No Has patient had a PCN reaction  occurring within the last 10 years: Yes--UTI ONLY If all of the above answers are "NO", then may proceed with Cephalosporin use.    Atenolol Cough   Crestor [Rosuvastatin Calcium] Other (See Comments)    Muscle aches   Pravastatin Other (See Comments)    Muscle aches   Sulfa Antibiotics Nausea Only   Codeine     hallucinations   Medications Prior to Admission  Medication Sig Dispense Refill   amiodarone (PACERONE) 200 MG tablet Place 1 tablet (200 mg total) into feeding tube daily.     apixaban (ELIQUIS) 5 MG TABS tablet Place 1 tablet (5 mg total) into feeding tube 2 (two) times daily. 60 tablet    arformoterol (BROVANA) 15 MCG/2ML NEBU Take 2 mLs (15 mcg total) by nebulization 2 (two) times daily. 120 mL    budesonide (PULMICORT) 0.5 MG/2ML nebulizer solution Take 2 mLs (0.5 mg total) by nebulization 2 (two) times daily.  12   buPROPion (WELLBUTRIN) 75 MG tablet Place 1 tablet (75 mg total) into feeding tube 2 (two) times daily.     ezetimibe (ZETIA) 10 MG tablet  Place 1 tablet (10 mg total) into feeding tube daily.     hydrALAZINE (APRESOLINE) 50 MG tablet Place 1 tablet (50 mg total) into feeding tube 2 (two) times daily.     insulin aspart (NOVOLOG FLEXPEN) 100 UNIT/ML FlexPen Inject 1-9 Units into the skin See admin instructions. 70-120=0 units, 121-150=1 unit, 151-200=2 units, 201-250=3 units, 251-300=5 units, 301-350=7 units, 351-400=9 units greater than 400 call MD     metoprolol tartrate (LOPRESSOR) 25 MG tablet Place 0.5 tablets (12.5 mg total) into feeding tube 2 (two) times daily.     Nutritional Supplements (FEEDING SUPPLEMENT, JEVITY 1.2 CAL,) LIQD Place 55 mL/hr into feeding tube continuous.  0   Nutritional Supplements (FEEDING SUPPLEMENT, PROSOURCE TF,) liquid Place 45 mLs into feeding tube 2 (two) times daily.     pantoprazole sodium (PROTONIX) 40 mg PACK Place 20 mLs (40 mg total) into feeding tube at bedtime. 30 packet    revefenacin (YUPELRI) 175 MCG/3ML nebulizer  solution Take 3 mLs (175 mcg total) by nebulization daily. 90 mL    Water For Irrigation, Sterile (FREE WATER) SOLN Place 200 mLs into feeding tube every 4 (four) hours.     acetaminophen (TYLENOL) 160 MG/5ML solution Place 20.3 mLs (650 mg total) into feeding tube every 4 (four) hours as needed for mild pain (or temp > 37.5 C (99.5 F)). (Patient not taking: No sig reported) 120 mL 0   albuterol (PROVENTIL) (2.5 MG/3ML) 0.083% nebulizer solution Take 3 mLs (2.5 mg total) by nebulization every 4 (four) hours as needed for wheezing or shortness of breath. (Patient not taking: Reported on 12/12/2020) 75 mL 12    Drug Regimen Review Drug regimen was reviewed and remains appropriate with no significant issues identified  Home: Home Living Family/patient expects to be discharged to:: Inpatient rehab Living Arrangements: Spouse/significant other Available Help at Discharge: Family, Available 24 hours/day Type of Home: Skilled Nursing Facility Home Access: Stairs to enter [employer] of Steps: 1 Entrance Stairs-Rails: None Home Layout: Two level, Able to live on main level with bedroom/bathroom Bathroom Shower/Tub: Engineer, manufacturing systems: Standard Home Equipment: Government social research officer, Environmental consultant - 2 wheels, Shower seat, Grab bars - tub/shower, Wheelchair - manual, Cane - single point Additional Comments: Recent admission for stroke with d/c to [facility] 12/06/20. Husband reports unsure if pt to return home, or if able to come home with hired caregivers   Functional History: Prior Function Level of Independence: Needs assistance Gait / Transfers Assistance Needed: Prior to stroke 11/13/20, pt independent without DME. Since stroke, has been at rehab since 12/06/20 with R-side hemiplegia and aphasia, assist for standing and transfers ADL's / Homemaking Assistance Needed: Prior to stroke 11/13/20, pt independent with ADLs/iADLs (manages her own finances and medicines without assist). Since  stroke, has been at rehab since 12/06/20 with R-side hemiplegia and aphasia, requires assist from staff for all ADLs Communication / Swallowing Assistance Needed: aphasic since cva Comments: Pt was able to perform all ADLs, mobility, and manage finances/meds without assistance.  Functional Status:  Mobility: Bed Mobility Overal bed mobility: Needs Assistance Bed Mobility: Supine to Sit Rolling: Max assist Sidelying to sit: Mod assist, +2 for physical assistance, +2 for safety/equipment Supine to sit: Mod assist, HOB elevated General bed mobility comments: Cues provided to manage legs to L EOB, needing assistance at R with intermittent muscle activation noted. Assistance provided to place R hand on chest and rotate trunk onto L elbow, but pt repositioning L elbow and displaying good initiation with  delay to push up on L hand to ascend trunk from elevated HOB, modA. Transfers Overall transfer level: Needs assistance Equipment used: 2 person hand held assist Transfer via Lift Equipment: Stedy Transfers: Sit to/from Stand, [employer] Transfers Sit to Stand: Mod assist, +2 safety/equipment Stand pivot transfers: Max assist, +2 physical assistance, +2 safety/equipment General transfer comment: Sit to stand 1x from EOB and 1x from recliner, cuing for hand placement and hand-over-hand R hand placement to assist with pushing up to stand. R knee blocked anteriorly and posteriorly to avoid buckling and hyperextension, modA to power up to stand, +2 for safety. MaxAx2 to steady pt, block R knee, provide UE support, shift weight, and advance legs to stand step to L. Ambulation/Gait Ambulation/Gait assistance: Max assist, +2 physical assistance, +2 safety/equipment Gait Distance (Feet): 2 Feet (x2 bouts of ~2 ft first bout and then focused on weight shifting with L foot advancement standing in front of chair 2nd bout) Assistive device: 2 person hand held assist Gait Pattern/deviations: Step-to pattern,  Decreased weight shift to right, Decreased weight shift to left, Trunk flexed, Narrow base of support, Decreased stride length General Gait Details: MaxAx2 to steady pt, block R knee, provide UE support, shift weight, and advance legs to stand step to L 1x. Pt displays poor weight shifting to R to allow L foot to clear the ground, needing assistance to slide her L foot to step to L. Thus, second bout focused on weight shifting to R with R knee blocked anteriorly and posteriorly to prevent buckling and hyperextension while cuing pt to lift her L foot off the ground. Pt showing attempts to lift her leg but needed physical assistance to lift her L leg due to likely distrust of her R leg. Needed physical assistance and cues to improve trunk postue. Gait velocity: decreased Gait velocity interpretation: <1.31 ft/sec, indicative of household ambulator    ADL: ADL Overall ADL's : Needs assistance/impaired Eating/Feeding: NPO Grooming: Sitting, Maximal assistance Grooming Details (indicate cue type and reason): sitting on staady at the sink. incrased assist this session due to hand over hand use of R hand to complete grooming tasks. vc for looking into mirror and lookign at R hand during task Upper Body Bathing: Sitting, Maximal assistance Lower Body Bathing: Total assistance, +2 for physical assistance, Sit to/from stand Upper Body Dressing : Maximal assistance, Sitting Lower Body Dressing: Total assistance, +2 for physical assistance, Sit to/from stand Lower Body Dressing Details (indicate cue type and reason): don socks Toilet Transfer: Minimal assistance, Maximal assistance (sit<>stand with stedy at recliner) Toilet Transfer Details (indicate cue type and reason): Min A for power up from EOB. Pt then able to perform sit<>stand from stedy seat during peri care. As pt fatigues , she required Max A for power up Toileting- Clothing Manipulation and Hygiene: Maximal assistance, +2 for physical  assistance Toileting - Clothing Manipulation Details (indicate cue type and reason): one person for maintaining standing and second person for peri care Functional mobility during ADLs: Minimal assistance, Total assistance (min A +2 for sit<>stand with use of steady frame - total A for transfers with steady) General ADL Comments: pt required incrased cueing and time for processing for all ADL tasks  Cognition: Cognition Overall Cognitive Status: Difficult to assess Orientation Level: Other (comment) (She is unable to speak, I could not assess this) Cognition Arousal/Alertness: Awake/alert Behavior During Therapy: Flat affect (majority of time but intermittent smiling or giggling) Overall Cognitive Status: Difficult to assess Area of Impairment: Following commands,  Awareness, Problem solving, Safety/judgement, Attention Current Attention Level: Focused Following Commands: Follows multi-step commands inconsistently, Follows one step commands inconsistently, Follows one step commands with increased time Safety/Judgement: Decreased awareness of safety, Decreased awareness of deficits Awareness: Intellectual Problem Solving: Slow processing, Difficulty sequencing, Decreased initiation, Requires verbal cues, Requires tactile cues General Comments: Pt beginning to show more of her personality by shrugging shoulders, rolling eyes, giggling, or smiling. Still not verbalizing words though this session. Pt follows simple commands with delay inconsistently. Requires multi-modal cues to sequence and perform all tasks. Difficult to assess due to: Impaired communication  Physical Exam: Blood pressure (!) 139/103, pulse 82, temperature 98.5 F (36.9 C), temperature source Axillary, resp. rate 18, height 5\' 3"  (1.6 m), weight 50.8 kg, SpO2 98 %. Physical Exam Vitals and nursing note reviewed. Exam conducted with a chaperone present.  Constitutional:      Comments: Pt awake, and staring at interviewer; but  wasn't able to nod head yes or no regularly- husband and son in room, husband doing her TF's via PEG; NAD  HENT:     Head: Normocephalic and atraumatic.     Comments: Unable to smile/stick tongue out- R facial droop at rest    Right Ear: External ear normal.     Left Ear: External ear normal.     Nose: Nose normal. No congestion.     Mouth/Throat:     Mouth: Mucous membranes are dry.     Pharynx: Oropharynx is clear. No oropharyngeal exudate.  Eyes:     General:        Right eye: No discharge.        Left eye: No discharge.     Comments: Following me some around room, however EOMs grossly intact-   Cardiovascular:     Rate and Rhythm: Regular rhythm.     Comments: Sounds in RRR_ not Afib- no JVD Pulmonary:     Comments: CTA B/L- no W/R/R- good air movement  Abdominal:     Comments: PEG tube in place. Abd binder covering it so doesn't pull out.  Abd binder hiked up too far Soft, NT, ND,  (+)BS  Genitourinary:    Comments: Purewick in place- incontinent of bladder and stool.  Musculoskeletal:     Cervical back: Normal range of motion. No rigidity.     Comments: RUE 0/5 except trace in R tricep RLE- trace movement of RLE with yawning but nothing seen voluntary LUE 5-/5 in muscles I could get compliance with exam LLE 5-/5 same  Skin:    Comments: Healed stage II on backside- foam dressing in place- sacrum   Neurological:     Comments: Patient is alert.  Makes eye contact with examiner.  She is aphasic.  Follows some simple commands. Follows <50% of commands today; Hoffman's RUE- brisk and 2-3 beats clonus RLE No increased tone seen on RUE/RLE as of yet Expressive aphasia; and some receptive aphasia seen also with apraxia? On Left side  Psychiatric:     Comments: Nonverbal- but did appear to have tears in eyes 2 times.     Results for orders placed or performed during the hospital encounter of 12/12/20 (from the past 48 hour(s))  Glucose, capillary     Status: Abnormal    Collection Time: 12/19/20 12:45 PM  Result Value Ref Range   Glucose-Capillary 134 (H) 70 - 99 mg/dL    Comment: Glucose reference range applies only to samples taken after fasting for at least 8 hours.  Glucose,  capillary     Status: None   Collection Time: 12/19/20  5:10 PM  Result Value Ref Range   Glucose-Capillary 97 70 - 99 mg/dL    Comment: Glucose reference range applies only to samples taken after fasting for at least 8 hours.  Glucose, capillary     Status: Abnormal   Collection Time: 12/19/20  7:57 PM  Result Value Ref Range   Glucose-Capillary 140 (H) 70 - 99 mg/dL    Comment: Glucose reference range applies only to samples taken after fasting for at least 8 hours.  Glucose, capillary     Status: Abnormal   Collection Time: 12/20/20 12:33 AM  Result Value Ref Range   Glucose-Capillary 144 (H) 70 - 99 mg/dL    Comment: Glucose reference range applies only to samples taken after fasting for at least 8 hours.  Glucose, capillary     Status: None   Collection Time: 12/20/20  4:12 AM  Result Value Ref Range   Glucose-Capillary 73 70 - 99 mg/dL    Comment: Glucose reference range applies only to samples taken after fasting for at least 8 hours.  Glucose, capillary     Status: None   Collection Time: 12/20/20  7:45 AM  Result Value Ref Range   Glucose-Capillary 81 70 - 99 mg/dL    Comment: Glucose reference range applies only to samples taken after fasting for at least 8 hours.  Glucose, capillary     Status: Abnormal   Collection Time: 12/20/20 11:46 AM  Result Value Ref Range   Glucose-Capillary 175 (H) 70 - 99 mg/dL    Comment: Glucose reference range applies only to samples taken after fasting for at least 8 hours.  Glucose, capillary     Status: Abnormal   Collection Time: 12/20/20  4:31 PM  Result Value Ref Range   Glucose-Capillary 100 (H) 70 - 99 mg/dL    Comment: Glucose reference range applies only to samples taken after fasting for at least 8 hours.  Glucose,  capillary     Status: Abnormal   Collection Time: 12/20/20  8:18 PM  Result Value Ref Range   Glucose-Capillary 125 (H) 70 - 99 mg/dL    Comment: Glucose reference range applies only to samples taken after fasting for at least 8 hours.  Glucose, capillary     Status: Abnormal   Collection Time: 12/20/20 11:52 PM  Result Value Ref Range   Glucose-Capillary 165 (H) 70 - 99 mg/dL    Comment: Glucose reference range applies only to samples taken after fasting for at least 8 hours.  CBC     Status: Abnormal   Collection Time: 12/21/20  2:04 AM  Result Value Ref Range   WBC 9.6 4.0 - 10.5 K/uL   RBC 4.35 3.87 - 5.11 MIL/uL   Hemoglobin 13.5 12.0 - 15.0 g/dL   HCT 57.8 46.9 - 62.9 %   MCV 98.6 80.0 - 100.0 fL   MCH 31.0 26.0 - 34.0 pg   MCHC 31.5 30.0 - 36.0 g/dL   RDW 52.8 (H) 41.3 - 24.4 %   Platelets 232 150 - 400 K/uL   nRBC 0.0 0.0 - 0.2 %    Comment: Performed at [facility], [address], [place], [place] [address]  Basic metabolic panel     Status: Abnormal   Collection Time: 12/21/20  2:04 AM  Result Value Ref Range   Sodium 137 135 - 145 mmol/L   Potassium 4.8 3.5 - 5.1  mmol/L   Chloride 98 98 - 111 mmol/L   CO2 31 22 - 32 mmol/L   Glucose, Bld 82 70 - 99 mg/dL    Comment: Glucose reference range applies only to samples taken after fasting for at least 8 hours.   BUN 28 (H) 8 - 23 mg/dL   Creatinine, Ser 1.61 0.44 - 1.00 mg/dL   Calcium 8.9 8.9 - 09.6 mg/dL   GFR, Estimated >04 >54 mL/min    Comment: (NOTE) Calculated using the CKD-EPI Creatinine Equation (2021)    Anion gap 8 5 - 15    Comment: Performed at [facility], [address], [place], [place] [address]  Glucose, capillary     Status: None   Collection Time: 12/21/20  4:38 AM  Result Value Ref Range   Glucose-Capillary 90 70 - 99 mg/dL    Comment: Glucose reference range applies only to samples taken after fasting for at least 8 hours.  Glucose, capillary     Status: None   Collection Time:  12/21/20  7:28 AM  Result Value Ref Range   Glucose-Capillary 86 70 - 99 mg/dL    Comment: Glucose reference range applies only to samples taken after fasting for at least 8 hours.   No results found.     Medical Problem List and Plan: 1.  Debility secondary to aspiration pneumonia/recent left MCA, PCA stroke with right hemiplegia aphasia and dysphagia  -patient may  shower if covers PEG  -ELOS/Goals: ~ 30 days- min to mod A 2.  Antithrombotics: -DVT/anticoagulation: Eliquis [employer]aceutical: Other (comment)  -antiplatelet therapy: N/A 3. Pain Management: Tylenol as needed 4. Mood: Wellbutrin 75 mg twice daily  -antipsychotic agents: N/A 5. Neuropsych: This patient is not capable of making decisions on her own behalf. 6. Skin/Wound Care: Routine skin checks 7. Fluids/Electrolytes/Nutrition: Routine in and outs with follow-up chemistries 8.  Dysphagia.  Status postgastrostomy tube 12/01/2020 per Dr. [name]- MBS today- don't have results right this moment 9.  Atrial fibrillation.  Eliquis.  Amiodarone 200 mg daily, Lopressor 12.5 mg twice daily.  Cardiac rate controlled 10.  Hyperlipidemia.  Zetia 11.  COPD.Revefenscin nebulizer daily, Brovana as well as Pulmicort 12.  Acute on chronic diastolic congestive heart failure.  Monitor for any signs of fluid overload  Charlton Amor, PA-C 12/21/2020   I have personally performed a face to face diagnostic evaluation of this patient and formulated the key components of the plan.  Additionally, I have personally reviewed laboratory data, imaging studies, as well as relevant notes and concur with the physician assistant's documentation above.   The patient's status has not changed from the original H&P.  Any changes in documentation from the acute care chart have been noted above.

## 2020-12-19 NOTE — Progress Notes (Signed)
PROGRESS NOTE    Beverly Li  YJE:[id number] DOB: [birth year] DOA: 12/12/2020 PCP: Clayborn Heron, MD  Brief Narrative:Beverly Li is a 75 y.o. 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 -Spouse noticed on 8/30 morning that patient was having respiratory distress,  she had not been on any other oral foods and had been receiving all nutrition through her PEG tube.   -In the emergency room she was febrile to 101.3, tachypneic, tachycardic and hypoxic requiring 4 L of O2, WBC was 19 K, chest x-ray noted right pleural effusion with worsening right basilar opacity. -Admitted with sepsis, aspiration pneumonia, clinically improved on antibiotics -Now hopeful for CIR for rehabilitation following recent stroke  Assessment & Plan:  Aspiration pneumonia Sepsis, poa  -Multiple antibiotic allergies noted, clinically improving on IV ceftriaxone and Flagyl -Changed to cefdinir via PEG, completed antibiotic course -SLP assessment appreciated, MBS pending -Blood cultures are negative -PT OT following, CIR recommended -Completed peer to peer for insurance today, updated therapy notes requested, awaiting authorization for CIR, TOC following  Acute respiratory failure with hypoxia  -COPD, without exacerbation:  -Previously discharged to SNF on 1 L O2, currently on 3 L  -Chest x-ray and previous imaging noted severe emphysema, now with aspiration pneumonia  -As above    Paroxysmal atrial fibrillation  -Continue Eliquis, metoprolol, and amiodarone   Diastolic CHF: Acute on chronic.   - noted to have worsening right-sided pleural effusion.  BNP elevated at 970.9.  Last echocardiogram revealed EF of 60  - 65%.   -Require diuretics last admission as well, given IV Lasix x1 on admission -Was started on p.o. Lasix, blood pressures soft yesterday, diuretics held, can resume at low-dose at discharge   Essential hypertension:  -Blood pressure is soft earlier this admission, hydralazine held,  -continue metoprolol 12.5 mg twice daily -Continue to trend   Recent left MCA, PCA stroke with right hemiplegia, aphasia, dysphagia  -Patient had been receiving therapies at [facility] after left MCA stroke status post thrombectomy. -PT/OT/speech following, hopeful for CIR rehabilitation  Abnormal UA and Cultures -unable to assess symptoms, improved back to baseline on antibiotics for aspiration pneumonia, I think this is a colonization   Dysphagia: Secondary to recent stroke PEG placed on 8/19. -Continue tube feeds with free water flushes -Ongoing therapy per SLP   Anxiety -Continue Wellbutrin   Right femoral popliteal bypass graft stenosis -Patient previously recommended to follow-up with vascular surgery in 2 to 3 months   GERD -Continue protonix   DVT prophylaxis: Eliquis Code Status: Full Family Communication: Husband updated at bedside Disposition Plan:  Status is: Inpatient  Remains inpatient appropriate because:Inpatient level of care appropriate due to severity of illness  Dispo: The patient is from: SNF              Anticipated d/c is to: Hopefully CIR              Patient currently is medically stable to d/c.   Difficult to place patient No   Subjective: -Feels okay, no events overnight  Objective: Vitals:   12/19/20 0600 12/19/20 0738 12/19/20 0742 12/19/20 0744  BP:  (!) 142/92    Pulse:  99    Resp:  18    Temp:  TempSrc:      SpO2:  95% 95% 95%  Weight: 50.6 kg     Height:        Intake/Output Summary (Last 24 hours) at 12/19/2020 1522 Last data filed at 12/19/2020 0021 Gross per 24 hour  Intake --  Output 1350 ml  Net -1350 ml   Filed Weights    12/17/20 0423 12/18/20 0342 12/19/20 0600  Weight: 50.3 kg 50.4 kg 50.6 kg    Examination:  General exam: Pleasant elderly female, laying in bed, awake alert, expressive aphasia CVs: S1-S2, regular rate rhythm Lungs: Decreased breath sounds to bases Abdomen: Soft, nontender, PEG tube noted, abdominal binder present  Extremities: No edema Neuro: Expressive aphasia, right hemiplegia  Skin: No rashes on exposed skin Psychiatry: Unable to assess    Data Reviewed:   CBC: Recent Labs  Lab 12/13/20 0240 12/14/20 0250 12/15/20 0221 12/16/20 0127 12/19/20 0245  WBC 12.0* 13.1* 10.3 8.5 9.9  NEUTROABS 10.9*  --   --   --   --   HGB 12.7 12.1 12.5 12.0 13.5  HCT 39.8 38.8 39.4 38.5 43.1  MCV 99.5 99.5 99.0 99.0 98.9  PLT 206 214 215 211 228   Basic Metabolic Panel: Recent Labs  Lab 12/13/20 0240 12/14/20 0250 12/16/20 0127 12/18/20 0103 12/19/20 0245  NA 137 135 137 138 137  K 4.0 3.8 4.5 4.4 4.4  CL 98 99 102 98 100  CO2 29 28 29  33* 31  GLUCOSE 116* 109* 129* 127* 71  BUN 39* 43* 32* 28* 30*  CREATININE 0.74 0.62 0.56 0.59 0.56  CALCIUM 8.5* 8.2* 8.7* 9.3 8.9   GFR: Estimated Creatinine Clearance: 48.5 mL/min (by C-G formula based on SCr of 0.56 mg/dL). Liver Function Tests: No results for input(s): AST, ALT, ALKPHOS, BILITOT, PROT, ALBUMIN in the last 168 hours.  No results for input(s): LIPASE, AMYLASE in the last 168 hours. No results for input(s): AMMONIA in the last 168 hours. Coagulation Profile: No results for input(s): INR, PROTIME in the last 168 hours.  Cardiac Enzymes: No results for input(s): CKTOTAL, CKMB, CKMBINDEX, TROPONINI in the last 168 hours. BNP (last 3 results) No results for input(s): PROBNP in the last 8760 hours. HbA1C: No results for input(s): HGBA1C in the last 72 hours. CBG: Recent Labs  Lab 12/18/20 1955 12/19/20 0010 12/19/20 0420 12/19/20 0850 12/19/20 1245  GLUCAP 94 128* 78 87 134*   Lipid Profile: No results for  input(s): CHOL, HDL, LDLCALC, TRIG, CHOLHDL, LDLDIRECT in the last 72 hours. Thyroid Function Tests: No results for input(s): TSH, T4TOTAL, FREET4, T3FREE, THYROIDAB in the last 72 hours. Anemia Panel: No results for input(s): VITAMINB12, FOLATE, FERRITIN, TIBC, IRON, RETICCTPCT in the last 72 hours. Urine analysis:    Component Value Date/Time   COLORURINE AMBER (A) 12/12/2020 2043   APPEARANCEUR HAZY (A) 12/12/2020 2043   LABSPEC 1.020 12/12/2020 2043   PHURINE 6.0 12/12/2020 2043   GLUCOSEU NEGATIVE 12/12/2020 2043   HGBUR NEGATIVE 12/12/2020 2043   BILIRUBINUR NEGATIVE 12/12/2020 2043   KETONESUR NEGATIVE 12/12/2020 2043   PROTEINUR 100 (A) 12/12/2020 2043   UROBILINOGEN 1.0 02/27/2015 1127   NITRITE NEGATIVE 12/12/2020 2043   LEUKOCYTESUR SMALL (A) 12/12/2020 2043   Sepsis Labs: @LABRCNTIP (procalcitonin:4,lacticidven:4)  ) Recent Results (from the past 240 hour(s))  Resp Panel by RT-PCR (Flu A&B, Covid) Nasopharyngeal Swab     Status: None   Collection Time: 12/12/20 11:39 AM   Specimen: Nasopharyngeal Swab; Nasopharyngeal(NP) swabs in vial transport medium  Result Value Ref Range Status   SARS Coronavirus 2 by RT PCR NEGATIVE NEGATIVE Final    Comment: (NOTE) SARS-CoV-2 target nucleic acids are NOT DETECTED.  The SARS-CoV-2 RNA is generally detectable in upper respiratory specimens during the acute phase of infection. The lowest concentration of SARS-CoV-2 viral copies this assay can detect is 138 copies/mL. A negative result does not preclude SARS-Cov-2 infection and should not be used as the sole basis for treatment or other patient management decisions. A negative result may occur with  improper specimen collection/handling, submission of specimen other than nasopharyngeal swab, presence of viral mutation(s) within the areas targeted by this assay, and inadequate number of viral copies(<138 copies/mL). A negative result must be combined with clinical observations,  patient history, and epidemiological information. The expected result is Negative.  Fact Sheet for Patients:  BloggerCourse.com  Fact Sheet for Healthcare Providers:  SeriousBroker.it  This test is no t yet approved or cleared by the [place] FDA and  has been authorized for detection and/or diagnosis of SARS-CoV-2 by FDA under an Emergency Use Authorization (EUA). This EUA will remain  in effect (meaning this test can be used) for the duration of the COVID-19 declaration under Section 564(b)(1) of the Act, 21 U.S.C.section 360bbb-3(b)(1), unless the authorization is terminated  or revoked sooner.       Influenza A by PCR NEGATIVE NEGATIVE Final   Influenza B by PCR NEGATIVE NEGATIVE Final    Comment: (NOTE) The Xpert Xpress SARS-CoV-2/FLU/RSV plus assay is intended as an aid in the diagnosis of influenza from Nasopharyngeal swab specimens and should not be used as a sole basis for treatment. Nasal washings and aspirates are unacceptable for Xpert Xpress SARS-CoV-2/FLU/RSV testing.  Fact Sheet for Patients: BloggerCourse.com  Fact Sheet for Healthcare Providers: SeriousBroker.it  This test is not yet approved or cleared by the [place] FDA and has been authorized for detection and/or diagnosis of SARS-CoV-2 by FDA under an Emergency Use Authorization (EUA). This EUA will remain in effect (meaning this test can be used) for the duration of the COVID-19 declaration under Section 564(b)(1) of the Act, 21 U.S.C. section 360bbb-3(b)(1), unless the authorization is terminated or revoked.  Performed at [facility], [address], [place], [place] [address]   Blood Culture (routine x 2)     Status: None   Collection Time: 12/12/20 12:49 PM   Specimen: BLOOD  Result Value Ref Range Status   Specimen Description BLOOD SITE NOT SPECIFIED  Final   Special Requests    Final    BOTTLES DRAWN AEROBIC AND ANAEROBIC Blood Culture results may not be optimal due to an inadequate volume of blood received in culture bottles   Culture   Final    NO GROWTH 5 DAYS Performed at [facility], [address], [place], [place] [address]    Report Status 12/17/2020 FINAL  Final  Blood Culture (routine x 2)     Status: None   Collection Time: 12/12/20 12:58 PM   Specimen: BLOOD  Result Value Ref Range Status   Specimen Description BLOOD RIGHT ANTECUBITAL  Final   Special Requests   Final    BOTTLES DRAWN AEROBIC AND ANAEROBIC Blood Culture adequate volume   Culture   Final    NO GROWTH 5 DAYS Performed at [facility], [address], [place], [place] [address]    Report Status 12/17/2020 FINAL  Final  Urine Culture     Status: Abnormal  Collection Time: 12/12/20  8:43 PM   Specimen: In/Out Cath Urine  Result Value Ref Range Status   Specimen Description IN/OUT CATH URINE  Final   Special Requests   Final    NONE Performed at [facility], [address], [place], [place] [address]    Culture (A)  Final    >=100,000 COLONIES/mL ENTEROCOCCUS FAECALIS 10,000 COLONIES/mL PSEUDOMONAS AERUGINOSA    Report Status 12/15/2020 FINAL  Final   Organism ID, Bacteria ENTEROCOCCUS FAECALIS (A)  Final   Organism ID, Bacteria PSEUDOMONAS AERUGINOSA (A)  Final      Susceptibility   Enterococcus faecalis - MIC*    AMPICILLIN <=2 SENSITIVE Sensitive     NITROFURANTOIN <=16 SENSITIVE Sensitive     VANCOMYCIN 2 SENSITIVE Sensitive     * >=100,000 COLONIES/mL ENTEROCOCCUS FAECALIS   Pseudomonas aeruginosa - MIC*    CEFTAZIDIME >=64 RESISTANT Resistant     CIPROFLOXACIN 0.5 SENSITIVE Sensitive     GENTAMICIN <=1 SENSITIVE Sensitive     IMIPENEM 1 SENSITIVE Sensitive     * 10,000 COLONIES/mL PSEUDOMONAS AERUGINOSA     Radiology Studies: No results found.  Scheduled Meds:  amiodarone  200 mg Per Tube Daily   apixaban  5 mg Per Tube BID    arformoterol  15 mcg Nebulization BID   budesonide  0.5 mg Nebulization BID   buPROPion  75 mg Per Tube BID   ezetimibe  10 mg Per Tube Daily   feeding supplement (JEVITY 1.2 CAL)  356 mL Per Tube QID   free water  200 mL Per Tube QID   insulin aspart  0-9 Units Subcutaneous Q4H   mouth rinse  15 mL Mouth Rinse BID   metoprolol tartrate  12.5 mg Per Tube BID   pantoprazole sodium  40 mg Per Tube QHS   potassium chloride  20 mEq Per Tube Daily   revefenacin  175 mcg Nebulization Daily   Continuous Infusions:   LOS: 7 days    Time spent:  Zannie Cove, MD [facility]   12/19/2020, 3:22 PM

## 2020-12-19 NOTE — Progress Notes (Signed)
Nutrition Follow-up  DOCUMENTATION CODES:   Severe malnutrition in context of chronic illness  INTERVENTION:   Continue bolus TF via PEG: Increase Jevity 1.2 to 356 ml (1.5 cartons) QID  Provides 1710 kcal, 79 gm protein, 1146 ml free water daily.  Free water flushes 200 ml QID to provide a total of 1946 ml per day.  NUTRITION DIAGNOSIS:   Severe Malnutrition related to chronic illness as evidenced by severe fat depletion, severe muscle depletion.  Ongoing   GOAL:   Patient will meet greater than or equal to 90% of their needs  Met with TF  MONITOR:   TF tolerance, Labs, Weight trends, Skin  REASON FOR ASSESSMENT:   New TF    ASSESSMENT:   75 yo female admitted with acute respiratory failure with COPD, aspiration pneumonia. PMH stroke with PEG, HTN, CHF  Spoke with patient's husband at bedside. He reports patient has had no nausea or vomiting with transition to bolus feedings. Husband is interested in helping give bolus feedings while he is here during the day. Discussed with RN.   Will increase tube feedings to goal of 1.5 cartons Jevity 1.2 QID.   Labs reviewed. BUN 30 CBG: [phone]  Medications reviewed and include Novolog, Klor-con, Protonix.  Weight stable at 50.6 kg  Diet Order:   Diet Order             Diet NPO time specified  Diet effective now                   EDUCATION NEEDS:   Education needs have been addressed  Skin:  Skin Assessment: Skin Integrity Issues: Skin Integrity Issues:: Other (Comment) Other: old pressure injury  Last BM:  9/4 type 6  Height:   Ht Readings from Last 1 Encounters:  12/12/20 _0  (1.6 m)    Weight:   Wt Readings from Last 1 Encounters:  12/19/20 50.6 kg    BMI:  Body mass index is 19.76 kg/m.  Estimated Nutritional Needs:   Kcal:  1600-1800 kcals  Protein:  80-90g  Fluid:  >/= 1.6 L    Lucas Mallow, RD, LDN, CNSC Please refer to Amion for contact information.

## 2020-12-20 LAB — GLUCOSE, CAPILLARY
Glucose-Capillary: 100 mg/dL — ABNORMAL HIGH (ref 70–99)
Glucose-Capillary: 125 mg/dL — ABNORMAL HIGH (ref 70–99)
Glucose-Capillary: 144 mg/dL — ABNORMAL HIGH (ref 70–99)
Glucose-Capillary: 165 mg/dL — ABNORMAL HIGH (ref 70–99)
Glucose-Capillary: 175 mg/dL — ABNORMAL HIGH (ref 70–99)
Glucose-Capillary: 73 mg/dL (ref 70–99)
Glucose-Capillary: 81 mg/dL (ref 70–99)

## 2020-12-20 NOTE — Progress Notes (Signed)
Physical Therapy Treatment Patient Details Name: Beverly Li MRN: [id number] DOB: 1945-05-17 Today's Date: 12/20/2020    History of Present Illness Pt is a 75 y.o. female recently d/c to [facility] for rehab after admission 11/13/20-12/06/20 for multiple infarcts requiring revascularization and intubation, now readmitted 12/12/20 with respiratory distress. CXR noted increased R pleural effusion, unchanged L pleural effusion. Workup for sepsis secondary to PNA; question possibility of bacterial pneumonia versus aspiration with possibility of compounding factor of CHF exacerbation. 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    PT Comments    Pt is continuing to make excellent progress towards her PT goals. This session, focused on improving sequencing of weight shifting, R quads activation in stance, and L foot clearance to allow for pt to progress with gait. She is displaying improved spontaneous and active muscle initiation in her R leg, especially with simple cues and extra time to respond. She is requiring modA to power up to stand and maxAx2 to take steps with bil UE support at this time. She needs blocking of her R knee posteriorly and anteriorly to prevent buckling and hyperextension. Pt is beginning to show more of her personality through smiling, rolling her eyes, shrugging her shoulders, and giggling this date. Pt and her husband are very motivated for her to improve. She continue to be a great candidate for CIR to maximize her independence and safety with all functional mobility and to attempt to prevent future hospitalizations. Will continue to follow acutely.    Follow Up Recommendations  CIR;Supervision/Assistance - 24 hour     Equipment Recommendations  Wheelchair (measurements PT);Wheelchair  cushion (measurements PT);Hospital bed;3in1 (PT);Other (comment) (lift equipment)    Recommendations for Other Services       Precautions / Restrictions Precautions Precautions: Fall;Other (comment) Precaution Comments: R hemiplegia with R shoulder subluxation, PEG, adominal binder; bladder/bowel incontinence Restrictions Weight Bearing Restrictions: No    Mobility  Bed Mobility Overal bed mobility: Needs Assistance Bed Mobility: Supine to Sit     Supine to sit: Mod assist;HOB elevated     General bed mobility comments: Cues provided to manage legs to L EOB, needing assistance at R with intermittent muscle activation noted. Assistance provided to place R hand on chest and rotate trunk onto L elbow, but pt repositioning L elbow and displaying good initiation with delay to push up on L hand to ascend trunk from elevated HOB, modA.    Transfers Overall transfer level: Needs assistance Equipment used: 2 person hand held assist Transfers: Sit to/from [employer] Sit to Stand: Mod assist;+2 safety/equipment Stand pivot transfers: Max assist;+2 physical assistance;+2 safety/equipment       General transfer comment: Sit to stand 1x from EOB and 1x from recliner, cuing for hand placement and hand-over-hand R hand placement to assist with pushing up to stand. R knee blocked anteriorly and posteriorly to avoid buckling and hyperextension, modA to power up to stand, +2 for safety. MaxAx2 to steady pt, block R knee, provide UE support, shift weight, and advance legs to stand step to L.  Ambulation/Gait Ambulation/Gait assistance: Max assist;+2 physical assistance;+2 safety/equipment Gait Distance (Feet): 2 Feet (x2 bouts of ~2 ft first bout and then focused on weight shifting with L foot advancement standing in front of chair 2nd bout) Assistive device: 2 person hand held assist Gait Pattern/deviations: Step-to pattern;Decreased weight shift  to right;Decreased weight shift  to left;Trunk flexed;Narrow base of support;Decreased stride length Gait velocity: decreased Gait velocity interpretation: <1.31 ft/sec, indicative of household ambulator General Gait Details: MaxAx2 to steady pt, block R knee, provide UE support, shift weight, and advance legs to stand step to L 1x. Pt displays poor weight shifting to R to allow L foot to clear the ground, needing assistance to slide her L foot to step to L. Thus, second bout focused on weight shifting to R with R knee blocked anteriorly and posteriorly to prevent buckling and hyperextension while cuing pt to lift her L foot off the ground. Pt showing attempts to lift her leg but needed physical assistance to lift her L leg due to likely distrust of her R leg. Needed physical assistance and cues to improve trunk postue.   Stairs             Wheelchair Mobility    Modified Rankin (Stroke Patients Only) Modified Rankin (Stroke Patients Only) Pre-Morbid Rankin Score: No symptoms Modified Rankin: Severe disability     Balance Overall balance assessment: Needs assistance Sitting-balance support: Single extremity supported;Feet supported Sitting balance-Leahy Scale: Poor Sitting balance - Comments: UE support and minA to sit statically EOB.   Standing balance support: Bilateral upper extremity supported;During functional activity Standing balance-Leahy Scale: Poor Standing balance comment: Bil HHA and modA with R knee blocked to stand statically. MaxAx2 to attempt to ambulate.                            Cognition Arousal/Alertness: Awake/alert Behavior During Therapy: Flat affect (majority of time but intermittent smiling or giggling) Overall Cognitive Status: Difficult to assess Area of Impairment: Following commands;Awareness;Problem solving;Safety/judgement;Attention                   Current Attention Level: Focused   Following Commands: Follows multi-step commands inconsistently;Follows  one step commands inconsistently;Follows one step commands with increased time Safety/Judgement: Decreased awareness of safety;Decreased awareness of deficits Awareness: Intellectual Problem Solving: Slow processing;Difficulty sequencing;Decreased initiation;Requires verbal cues;Requires tactile cues General Comments: Pt beginning to show more of her personality by shrugging shoulders, rolling eyes, giggling, or smiling. Still not verbalizing words though this session. Pt follows simple commands with delay inconsistently. Requires multi-modal cues to sequence and perform all tasks.      Exercises Other Exercises Other Exercises: Attempted to facilitate R quads activation through cuing pt to push PT away with foot, success noted Other Exercises: Attempted to activate R UE muscles through cues to push up off chair or push PT away, min activation noted intermittently during session Other Exercises: spontaneous ankle DF/PF muscles activation noted during session, but no success when cued to perform active movements    General Comments General comments (skin integrity, edema, etc.): Educated pt and husband on need to allow pt time to respond to cues and to attempt diffierent wording to see which cues work for her. Educated them on supporting R shoulder to prevent subluxation/injury.      Pertinent Vitals/Pain Pain Assessment: Faces Faces Pain Scale: Hurts a little bit Pain Location: generalized grimacing with mobility Pain Descriptors / Indicators: Grimacing Pain Intervention(s): Limited activity within patient's tolerance;Monitored during session;Repositioned    Home Living                      Prior Function            PT Goals (current goals can  now be found in the care plan section) Acute Rehab PT Goals Patient Stated Goal: pt did not state; family desires for pt to improve PT Goal Formulation: With patient/family Time For Goal Achievement: 12/27/20 Potential to Achieve  Goals: Good Progress towards PT goals: Progressing toward goals    Frequency    Min 4X/week      PT Plan Current plan remains appropriate    Co-evaluation              AM-PAC PT "6 Clicks" Mobility   Outcome Measure  Help needed turning from your back to your side while in a flat bed without using bedrails?: A Lot Help needed moving from lying on your back to sitting on the side of a flat bed without using bedrails?: A Lot Help needed moving to and from a bed to a chair (including a wheelchair)?: Total Help needed standing up from a chair using your arms (e.g., wheelchair or bedside chair)?: A Lot Help needed to walk in hospital room?: Total Help needed climbing 3-5 steps with a railing? : Total 6 Click Score: 9    End of Session Equipment Utilized During Treatment: Gait belt;Oxygen Activity Tolerance: Patient tolerated treatment well Patient left: in chair;with call bell/phone within reach;with chair alarm set;with family/visitor present Nurse Communication: Mobility status;Need for lift equipment PT Visit Diagnosis: Muscle weakness (generalized) (M62.81);Difficulty in walking, not elsewhere classified (R26.2);Other symptoms and signs involving the nervous system (R29.898);Hemiplegia and hemiparesis;Unsteadiness on feet (R26.81) Hemiplegia - Right/Left: Right Hemiplegia - dominant/non-dominant: Dominant Hemiplegia - caused by: Cerebral infarction     Time: 3419-6222 PT Time Calculation (min) (ACUTE ONLY): 36 min  Charges:  $Gait Training: 8-22 mins $Neuromuscular Re-education: 8-22 mins                     Raymond Gurney, PT, DPT Acute Rehabilitation Services  Pager: [phone] Office: [phone]    Beverly Li 12/20/2020, 11:43 AM

## 2020-12-20 NOTE — Progress Notes (Signed)
PROGRESS NOTE    B[facility]ly Li  TIR:[id number] DOB: 07/04/1945 DOA: 12/12/2020 PCP: Clayborn Heron, MD  Brief Narrative:Beverly Li is a 75 y.o. 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 -Spouse noticed on 8/30 morning that patient was having respiratory distress,  she had not been on any other oral foods and had been receiving all nutrition through her PEG tube.   -In the emergency room she was febrile to 101.3, tachypneic, tachycardic and hypoxic requiring 4 L of O2, WBC was 19 K, chest x-ray noted right pleural effusion with worsening right basilar opacity. -Admitted with sepsis, aspiration pneumonia, clinically improved on antibiotics -Now hopeful for CIR for rehabilitation following recent stroke  Assessment & Plan:  Aspiration pneumonia Sepsis, poa  -Multiple antibiotic allergies present, clinically improved on IV ceftriaxone and Flagyl and then transition to cefdinir via PEG, completed antibiotic course -SLP following, continues to make progress, plan for MBS -Blood cultures are negative -PT OT following, CIR recommended -Completed peer to peer for insurance yesterday, updated therapy notes requested, awaiting authorization for CIR-we think she would be an excellent candidate - TOC following  Acute respiratory failure with hypoxia  -COPD, without exacerbation:  -Previously discharged to SNF on 1 L O2, currently on 3 L  -Chest x-ray and previous imaging noted severe emphysema, admitted with aspiration pneumonia  -As above, wean off O2 as tolerated   Paroxysmal atrial fibrillation  -Continue Eliquis, metoprolol, and amiodarone   Diastolic CHF: Acute on chronic.   - noted to  have worsening right-sided pleural effusion.  BNP elevated at 970.9.  Last echocardiogram revealed EF of 60 - 65%.   -Require diuretics last admission as well, given IV Lasix x1 on admission -Was started on p.o. Lasix, blood pressures low on Monday, diuretics held, can resume at low-dose at discharge   Essential hypertension:  -Blood pressure is soft earlier this admission, hydralazine held,  -continue metoprolol 12.5 mg twice daily -Continue to trend   Recent left MCA, PCA stroke with right hemiplegia, aphasia, dysphagia  -Patient had been receiving therapies at [facility] after left MCA stroke status post thrombectomy. -PT/OT/speech following, hopeful for CIR rehabilitation  Abnormal UA and Cultures -unable to assess symptoms, improved back to baseline on antibiotics for aspiration pneumonia, I think this is a colonization   Dysphagia: Secondary to recent stroke PEG placed on 8/19. -Continue tube feeds with free water flushes -Ongoing therapy per SLP   Anxiety -Continue Wellbutrin   Right femoral popliteal bypass graft stenosis -Patient previously recommended to follow-up with vascular surgery in 2 to 3 months   GERD -Continue protonix   DVT prophylaxis: Eliquis Code Status: Full Family Communication: Husband updated at bedside Disposition Plan:  Status is: Inpatient  Remains inpatient appropriate because:Inpatient level of care appropriate due to severity of illness  Dispo: The patient is from: SNF              Anticipated d/c is to: Hopefully CIR              Patient currently is medically stable to d/c.   Difficult to place patient No   Subjective: -Feels okay, no events overnight  Objective: Vitals:   12/19/20 2000 12/19/20 2150 12/19/20 2326 12/20/20 0413  BP:  120/71 121/86 120/89 134/80  Pulse: 85 87 79 82  Resp: 16  17 13   Temp: 97.6 F (36.4 C)  (!) 97.5 F (36.4 C) (!) 97.5 F (36.4 C)  TempSrc: Axillary  Axillary Axillary  SpO2: 95%     Weight:     50.7 kg  Height:        Intake/Output Summary (Last 24 hours) at 12/20/2020 1200 Last data filed at 12/19/2020 2153 Gross per 24 hour  Intake 356 ml  Output 700 ml  Net -344 ml   Filed Weights   12/18/20 0342 12/19/20 0600 12/20/20 0413  Weight: 50.4 kg 50.6 kg 50.7 kg    Examination:  General exam: Pleasant elderly female sitting up in bed, awake alert, smiling, expressive aphasia CVS: S1-S2, regular rate rhythm Lungs: Decreased breath sounds at the bases Abdomen: Soft, nontender, PEG tube noted, abdominal binder present Extremities: No edema  Neuro: Expressive aphasia, right hemiplegia  Skin: No rashes on exposed skin Psychiatry: Unable to assess    Data Reviewed:   CBC: Recent Labs  Lab 12/14/20 0250 12/15/20 0221 12/16/20 0127 12/19/20 0245  WBC 13.1* 10.3 8.5 9.9  HGB 12.1 12.5 12.0 13.5  HCT 38.8 39.4 38.5 43.1  MCV 99.5 99.0 99.0 98.9  PLT 214 215 211 228   Basic Metabolic Panel: Recent Labs  Lab 12/14/20 0250 12/16/20 0127 12/18/20 0103 12/19/20 0245  NA 135 137 138 137  K 3.8 4.5 4.4 4.4  CL 99 102 98 100  CO2 28 29 33* 31  GLUCOSE 109* 129* 127* 71  BUN 43* 32* 28* 30*  CREATININE 0.62 0.56 0.59 0.56  CALCIUM 8.2* 8.7* 9.3 8.9   GFR: Estimated Creatinine Clearance: 48.6 mL/min (by C-G formula based on SCr of 0.56 mg/dL). Liver Function Tests: No results for input(s): AST, ALT, ALKPHOS, BILITOT, PROT, ALBUMIN in the last 168 hours.  No results for input(s): LIPASE, AMYLASE in the last 168 hours. No results for input(s): AMMONIA in the last 168 hours. Coagulation Profile: No results for input(s): INR, PROTIME in the last 168 hours.  Cardiac Enzymes: No results for input(s): CKTOTAL, CKMB, CKMBINDEX, TROPONINI in the last 168 hours. BNP (last 3 results) No results for input(s): PROBNP in the last 8760 hours. HbA1C: No results for input(s): HGBA1C in the last 72 hours. CBG: Recent Labs  Lab 12/19/20 1957 12/20/20 0033 12/20/20 0412  12/20/20 0745 12/20/20 1146  GLUCAP 140* 144* 73 81 175*   Lipid Profile: No results for input(s): CHOL, HDL, LDLCALC, TRIG, CHOLHDL, LDLDIRECT in the last 72 hours. Thyroid Function Tests: No results for input(s): TSH, T4TOTAL, FREET4, T3FREE, THYROIDAB in the last 72 hours. Anemia Panel: No results for input(s): VITAMINB12, FOLATE, FERRITIN, TIBC, IRON, RETICCTPCT in the last 72 hours. Urine analysis:    Component Value Date/Time   COLORURINE AMBER (A) 12/12/2020 2043   APPEARANCEUR HAZY (A) 12/12/2020 2043   LABSPEC 1.020 12/12/2020 2043   PHURINE 6.0 12/12/2020 2043   GLUCOSEU NEGATIVE 12/12/2020 2043   HGBUR NEGATIVE 12/12/2020 2043   BILIRUBINUR NEGATIVE 12/12/2020 2043   KETONESUR NEGATIVE 12/12/2020 2043   PROTEINUR 100 (A) 12/12/2020 2043   UROBILINOGEN 1.0 02/27/2015 1127   NITRITE NEGATIVE 12/12/2020 2043   LEUKOCYTESUR SMALL (A) 12/12/2020 2043   Sepsis Labs: @LABRCNTIP (procalcitonin:4,lacticidven:4)  ) Recent Results (from the past 240 hour(s))  Resp Panel by RT-PCR (Flu A&B, Covid) Nasopharyngeal Swab     Status: None   Collection Time: 12/12/20 11:39 AM   Specimen: Nasopharyngeal Swab;  Nasopharyngeal(NP) swabs in vial transport medium  Result Value Ref Range Status   SARS Coronavirus 2 by RT PCR NEGATIVE NEGATIVE Final    Comment: (NOTE) SARS-CoV-2 target nucleic acids are NOT DETECTED.  The SARS-CoV-2 RNA is generally detectable in upper respiratory specimens during the acute phase of infection. The lowest concentration of SARS-CoV-2 viral copies this assay can detect is 138 copies/mL. A negative result does not preclude SARS-Cov-2 infection and should not be used as the sole basis for treatment or other patient management decisions. A negative result may occur with  improper specimen collection/handling, submission of specimen other than nasopharyngeal swab, presence of viral mutation(s) within the areas targeted by this assay, and inadequate number of  viral copies(<138 copies/mL). A negative result must be combined with clinical observations, patient history, and epidemiological information. The expected result is Negative.  Fact Sheet for Patients:  BloggerCourse.com  Fact Sheet for Healthcare Providers:  SeriousBroker.it  This test is no t yet approved or cleared by the [place] FDA and  has been authorized for detection and/or diagnosis of SARS-CoV-2 by FDA under an Emergency Use Authorization (EUA). This EUA will remain  in effect (meaning this test can be used) for the duration of the COVID-19 declaration under Section 564(b)(1) of the Act, 21 U.S.C.section 360bbb-3(b)(1), unless the authorization is terminated  or revoked sooner.       Influenza A by PCR NEGATIVE NEGATIVE Final   Influenza B by PCR NEGATIVE NEGATIVE Final    Comment: (NOTE) The Xpert Xpress SARS-CoV-2/FLU/RSV plus assay is intended as an aid in the diagnosis of influenza from Nasopharyngeal swab specimens and should not be used as a sole basis for treatment. Nasal washings and aspirates are unacceptable for Xpert Xpress SARS-CoV-2/FLU/RSV testing.  Fact Sheet for Patients: BloggerCourse.com  Fact Sheet for Healthcare Providers: SeriousBroker.it  This test is not yet approved or cleared by the [place] FDA and has been authorized for detection and/or diagnosis of SARS-CoV-2 by FDA under an Emergency Use Authorization (EUA). This EUA will remain in effect (meaning this test can be used) for the duration of the COVID-19 declaration under Section 564(b)(1) of the Act, 21 U.S.C. section 360bbb-3(b)(1), unless the authorization is terminated or revoked.  Performed at [facility], [address] [place], [place] [address]   Blood Culture (routine x 2)     Status: None   Collection Time: 12/12/20 12:49 PM   Specimen: BLOOD  Result  Value Ref Range Status   Specimen Description BLOOD SITE NOT SPECIFIED  Final   Special Requests   Final    BOTTLES DRAWN AEROBIC AND ANAEROBIC Blood Culture results may not be optimal due to an inadequate volume of blood received in culture bottles   Culture   Final    NO GROWTH 5 DAYS Performed at [facility], [address], [place], [place] [address]    Report Status 12/17/2020 FINAL  Final  Blood Culture (routine x 2)     Status: None   Collection Time: 12/12/20 12:58 PM   Specimen: BLOOD  Result Value Ref Range Status   Specimen Description BLOOD RIGHT ANTECUBITAL  Final   Special Requests   Final    BOTTLES DRAWN AEROBIC AND ANAEROBIC Blood Culture adequate volume   Culture   Final    NO GROWTH 5 DAYS Performed at [facility], [address], [place], [place] [address]    Report Status 12/17/2020 FINAL  Final  Urine Culture  Status: Abnormal   Collection Time: 12/12/20  8:43 PM   Specimen: In/Out Cath Urine  Result Value Ref Range Status   Specimen Description IN/OUT CATH URINE  Final   Special Requests   Final    NONE Performed at [facility], [address], [place], [place] [address]    Culture (A)  Final    >=100,000 COLONIES/mL ENTEROCOCCUS FAECALIS 10,000 COLONIES/mL PSEUDOMONAS AERUGINOSA    Report Status 12/15/2020 FINAL  Final   Organism ID, Bacteria ENTEROCOCCUS FAECALIS (A)  Final   Organism ID, Bacteria PSEUDOMONAS AERUGINOSA (A)  Final      Susceptibility   Enterococcus faecalis - MIC*    AMPICILLIN <=2 SENSITIVE Sensitive     NITROFURANTOIN <=16 SENSITIVE Sensitive     VANCOMYCIN 2 SENSITIVE Sensitive     * >=100,000 COLONIES/mL ENTEROCOCCUS FAECALIS   Pseudomonas aeruginosa - MIC*    CEFTAZIDIME >=64 RESISTANT Resistant     CIPROFLOXACIN 0.5 SENSITIVE Sensitive     GENTAMICIN <=1 SENSITIVE Sensitive     IMIPENEM 1 SENSITIVE Sensitive     * 10,000 COLONIES/mL PSEUDOMONAS AERUGINOSA     Radiology Studies: No results  found.  Scheduled Meds:  amiodarone  200 mg Per Tube Daily   apixaban  5 mg Per Tube BID   arformoterol  15 mcg Nebulization BID   budesonide  0.5 mg Nebulization BID   buPROPion  75 mg Per Tube BID   ezetimibe  10 mg Per Tube Daily   feeding supplement (JEVITY 1.2 CAL)  356 mL Per Tube QID   free water  200 mL Per Tube QID   insulin aspart  0-9 Units Subcutaneous Q4H   mouth rinse  15 mL Mouth Rinse BID   metoprolol tartrate  12.5 mg Per Tube BID   pantoprazole sodium  40 mg Per Tube QHS   potassium chloride  20 mEq Per Tube Daily   revefenacin  175 mcg Nebulization Daily   Continuous Infusions:   LOS: 8 days    Time spent:  Zannie Cove, MD [facility]   12/20/2020, 12:00 PM

## 2020-12-20 NOTE — TOC Progression Note (Signed)
Transition of Care [facility]) - Progression Note    Patient Details  Name: RAVEEN WIESELER MRN: [id number] Date of Birth: [birth year]  [facility]) CM/SW Contact  Terrial Rhodes, LCSWA Phone Number: 12/20/2020, 4:20 PM  Clinical Narrative:      CSW spoke with patients son Michae Kava about SNF as back up plan to CIR. Son is in agreement to SNF if CIR is denied. Patients son gave CSW permission to send referral to [facility].Patients son wanted CSW to follow up with his spouse [facility] regarding SNF plans for patient. CSW called [facility] and left voicemail. CSW awaiting callback. CSW spoke with French Ana with [facility] who confirmed with CSW that she will be looking for referral to review. CSW sent over referral to [facility] for review. CSW will continue to follow and assist with patients dc planning needs.      Expected Discharge Plan and Services                                                 Social Determinants of Health (SDOH) Interventions    Readmission Risk Interventions No flowsheet data found.

## 2020-12-20 NOTE — NC FL2 (Signed)
[place] MEDICAID FL2 LEVEL OF CARE SCREENING TOOL     IDENTIFICATION  Patient Name: Beverly Li Birthdate: Aug 12, 1945 Sex: female Admission Date (Current Location): 12/12/2020  [facility] and [place] Number:  Producer, television/film/video and Address:  [facility], [address] [place], [place] [address]      Provider Number: [id number]  Attending Physician Name and Address:  Zannie Cove, MD  Relative Name and Phone Number:  Chrissie Noa [phone]    Current Level of Care: Hospital Recommended Level of Care: Skilled Nursing Facility Prior Approval Number:    Date Approved/Denied:   PASRR Number: [id number]  Discharge Plan: SNF    Current Diagnoses: Patient Active Problem List   Diagnosis Date Noted   Sepsis due to pneumonia (HCC) 12/12/2020   Dysphagia 12/12/2020   GERD (gastroesophageal reflux disease) 12/12/2020   Hypoxia    Respiratory distress    Respiration abnormal    Pressure injury of skin 11/23/2020   Protein-calorie malnutrition, severe 11/16/2020   Stroke (HCC) 11/13/2020   Middle cerebral artery embolism, left 11/13/2020   Encounter for feeding tube placement 04/27/2020   Sinus bradycardia 12/25/2019   COPD (chronic obstructive pulmonary disease) (HCC) 12/16/2018   Cough 12/16/2018   Unintentional weight loss 10/05/2018   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   Visit for monitoring Tikosyn therapy 11/18/2017   Closed nondisplaced fracture of head of radius with routine healing 03/03/2017   Coronary artery disease involving native coronary artery of native heart without angina pectoris    SOB (shortness of breath) 01/26/2016   Acute on chronic diastolic CHF (congestive heart failure) (HCC) 03/28/2015   Paroxysmal atrial fibrillation (HCC)    Essential hypertension    S/P femoral-popliteal bypass surgery    Obstructive hypertrophic cardiomyopathy (HCC) 03/03/2015   PAD (peripheral artery disease) (HCC) 03/02/2015   Acute respiratory  failure with hypoxia (HCC) 03/02/2015   Claudication (HCC) 01/31/2015   Former smoker 01/31/2015   Hyperlipidemia 01/31/2015   Atherosclerosis of native arteries of extremity with intermittent claudication (HCC) 02/10/2014   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   Atrial arrhythmia    Mild aortic sclerosis     Orientation RESPIRATION BLADDER Height & Weight      (unable to assess non verbal)  O2 (Nasal Cannula 2 liters) Incontinent, External catheter (External Urinary Catheter) Weight: 111 lb 12.4 oz (50.7 kg) Height:  5\' 3"  (160 cm)  BEHAVIORAL SYMPTOMS/MOOD NEUROLOGICAL BOWEL NUTRITION STATUS      Incontinent Diet (Please see discharge summary)  AMBULATORY STATUS COMMUNICATION OF NEEDS Skin   Extensive Assist Non-Verbally ([place]; Difficulty speaking) Other (Comment) (catheter entry/exit site,abdomen,mid,left,gauze,PI coccyx posterior,old pressure injury,scarring noted with red spots,also with blanchable redness,foam lift dressing, every 3 days)                       Personal Care Assistance Level of Assistance  Bathing, Feeding, Dressing Bathing Assistance: Maximum assistance Feeding assistance: Maximum assistance (Feeding tube Bolus Jevity 1.2 Cal) Dressing Assistance: Maximum assistance     Functional Limitations Info  Sight, Hearing, Speech     Speech Info: Impaired ([place]; Difficulty speaking)    SPECIAL CARE FACTORS FREQUENCY  PT (By licensed PT), OT (By licensed OT)     PT Frequency: 5x min weekly OT Frequency: 5x min weekly     Speech Therapy Frequency: 2x min weekly      Contractures Contractures Info: Not present    Additional Factors Info  Code Status, Allergies, Insulin  Sliding Scale Code Status Info: FULL Allergies Info: Amoxicillin,Atenolol,Crestor (rosuvastatin Calcium),Pravastatin,Sulfa Antibiotics,Codeine Psychotropic Info: buPROPion [facility]) tablet 75 mg 2 times daily, Insulin Sliding Scale Info: insulin aspart (novoLOG)  injection 0-9 Units every 4 hours       Current Medications (12/20/2020):  This is the current hospital active medication list Current Facility-Administered Medications  Medication Dose Route Frequency Provider Last Rate Last Admin   acetaminophen (TYLENOL) 160 MG/5ML solution 650 mg  650 mg Per Tube Q4H PRN Madelyn Flavors A, MD       amiodarone (PACERONE) tablet 200 mg  200 mg Per Tube Daily Katrinka Blazing, Rondell A, MD   200 mg at 12/20/20 0907   apixaban (ELIQUIS) tablet 5 mg  5 mg Per Tube BID Madelyn Flavors A, MD   5 mg at 12/20/20 0907   arformoterol (BROVANA) nebulizer solution 15 mcg  15 mcg Nebulization BID Madelyn Flavors A, MD   15 mcg at 12/19/20 2114   budesonide (PULMICORT) nebulizer solution 0.5 mg  0.5 mg Nebulization BID Madelyn Flavors A, MD   0.5 mg at 12/19/20 2114   buPROPion [facility]) tablet 75 mg  75 mg Per Tube BID Madelyn Flavors A, MD   75 mg at 12/20/20 0907   ezetimibe (ZETIA) tablet 10 mg  10 mg Per Tube Daily Smith, Rondell A, MD   10 mg at 12/20/20 0907   feeding supplement (JEVITY 1.2 CAL) liquid 356 mL  356 mL Per Tube QID Zannie Cove, MD   356 mL at 12/20/20 1217   free water 200 mL  200 mL Per Tube QID Zannie Cove, MD   200 mL at 12/20/20 1218   insulin aspart (novoLOG) injection 0-9 Units  0-9 Units Subcutaneous Q4H Madelyn Flavors A, MD   2 Units at 12/20/20 1218   ipratropium-albuterol (DUONEB) 0.5-2.5 (3) MG/3ML nebulizer solution 3 mL  3 mL Nebulization Q4H PRN Zannie Cove, MD       MEDLINE mouth rinse  15 mL Mouth Rinse BID Zannie Cove, MD   15 mL at 12/20/20 0907   metoprolol tartrate (LOPRESSOR) tablet 12.5 mg  12.5 mg Per Tube BID Madelyn Flavors A, MD   12.5 mg at 12/20/20 0907   pantoprazole sodium (PROTONIX) 40 mg oral suspension 40 mg  40 mg Per Tube QHS Smith, Rondell A, MD   40 mg at 12/20/20 0006   potassium chloride (KLOR-CON) packet 20 mEq  20 mEq Per Tube Daily Zannie Cove, MD   20 mEq at 12/20/20 6761   revefenacin (YUPELRI)  nebulizer solution 175 mcg  175 mcg Nebulization Daily Clydie Braun, MD   175 mcg at 12/19/20 9509     Discharge Medications: Please see discharge summary for a list of discharge medications.  Relevant Imaging Results:  Relevant Lab Results:   Additional Information [id number], Both Covid vaccines and booster  Terrial Rhodes, LCSWA

## 2020-12-20 NOTE — Progress Notes (Signed)
  Speech Language Pathology Treatment: Dysphagia;Cognitive-Linquistic  Patient Details Name: Beverly Li MRN: [id number] DOB: [birth year] Today's Date: 12/20/2020 Time: 6269-4854 SLP Time Calculation (min) (ACUTE ONLY): 32 min  Assessment / Plan / Recommendation Clinical Impression  Beverly Li continues to make excellent gains toward goals - she was sitting in recliner after PT session, engaged and expressive with facial movements.    Swallowing: Pt fed herself bites of pudding with initial assist to load spoon and bring it to her mouth - once established, she was able to repeat the pattern multiple times, ultimately eating half the container of pudding.  She demonstrated active manipulation of material with lips/tongue; required min verbal/visual cues to attend to spillage right side of mouth.  Swallow response was consistently palpated.  No concerns for aspiration. Beverly Li is ready for a repeat MBS - will schedule for next date.  Beverly Li agrees with plan.  Communication: Pt is vocalizing more frequently.  During speech/voicing facilitation (singing, counting), she generated volitional voice on four occasions.  Her attempts and effort are evident.  Demonstrated improved ability to follow simple, contextual commands with intermittent verbal/visual cues.  Pt continues to make excellent steady progress toward communication/swallowing goals.  Continue to recommend CIR for rehab, particularly given her consistent efforts.    HPI HPI: Pt is a 75 y.o. female recently d/c to [facility] for rehab after admission 11/13/20-12/06/20 for multiple infarcts requiring revascularization and intubation, now readmitted 12/12/20 with respiratory distress. CXR noted increased R pleural effusion, unchanged L pleural effusion. Workup for sepsis secondary to PNA; question possibility of bacterial pneumonia versus aspiration with possibility of compounding factor of CHF exacerbation. PMH includes multiple infarcts  11/2020 (L MCA and ACA territory infarcts, scattered punctate infarcts R frontoparietal cortex, R cerebellum), COPD, afib, PAD, HTN, cardiomyopathy, aortic stenosis, Bell's palsy, anxiety, osteopenia. MBS 8/24 with dx of severe primarily oral dysphagia with adequate ability to protect airway with nectar-thick and puree - recommended therapeutic trials of puree/teaspoons of nectar with SLP>      SLP Plan  Continue with current plan of care;MBS       Recommendations  Diet recommendations: NPO                Oral Care Recommendations: Oral care QID Follow up Recommendations: Inpatient Rehab SLP Visit Diagnosis: Dysphagia, oral phase (R13.11) Plan: Continue with current plan of care;MBS                      Zade Falkner L. Samson Frederic, MA CCC/SLP Acute Rehabilitation Services Office number [phone] Pager [phone]  [facility]ly [facility] 12/20/2020, 11:05 AM

## 2020-12-21 ENCOUNTER — Other Ambulatory Visit: Payer: Self-pay

## 2020-12-21 ENCOUNTER — Inpatient Hospital Stay (HOSPITAL_COMMUNITY): Payer: Medicare Other

## 2020-12-21 ENCOUNTER — Encounter (HOSPITAL_COMMUNITY): Payer: Self-pay | Admitting: Physical Medicine & Rehabilitation

## 2020-12-21 ENCOUNTER — Inpatient Hospital Stay (HOSPITAL_COMMUNITY)
Admission: RE | Admit: 2020-12-21 | Discharge: 2021-01-15 | DRG: 056 | Disposition: A | Discharge status: Skilled Nursing Facility | Payer: Medicare Other | Source: Intra-hospital | Attending: Physical Medicine & Rehabilitation | Admitting: Physical Medicine & Rehabilitation

## 2020-12-21 DIAGNOSIS — I639 Cerebral infarction, unspecified: Secondary | ICD-10-CM | POA: Diagnosis not present

## 2020-12-21 DIAGNOSIS — J449 Chronic obstructive pulmonary disease, unspecified: Secondary | ICD-10-CM | POA: Diagnosis not present

## 2020-12-21 DIAGNOSIS — R1310 Dysphagia, unspecified: Secondary | ICD-10-CM | POA: Diagnosis not present

## 2020-12-21 DIAGNOSIS — I69351 Hemiplegia and hemiparesis following cerebral infarction affecting right dominant side: Secondary | ICD-10-CM | POA: Diagnosis not present

## 2020-12-21 DIAGNOSIS — R5381 Other malaise: Secondary | ICD-10-CM | POA: Diagnosis present

## 2020-12-21 DIAGNOSIS — I422 Other hypertrophic cardiomyopathy: Secondary | ICD-10-CM | POA: Diagnosis present

## 2020-12-21 DIAGNOSIS — I63512 Cerebral infarction due to unspecified occlusion or stenosis of left middle cerebral artery: Secondary | ICD-10-CM

## 2020-12-21 DIAGNOSIS — I4891 Unspecified atrial fibrillation: Secondary | ICD-10-CM | POA: Diagnosis present

## 2020-12-21 DIAGNOSIS — Z801 Family history of malignant neoplasm of trachea, bronchus and lung: Secondary | ICD-10-CM

## 2020-12-21 DIAGNOSIS — E785 Hyperlipidemia, unspecified: Secondary | ICD-10-CM | POA: Diagnosis present

## 2020-12-21 DIAGNOSIS — I69320 Aphasia following cerebral infarction: Secondary | ICD-10-CM | POA: Diagnosis not present

## 2020-12-21 DIAGNOSIS — Z20822 Contact with and (suspected) exposure to covid-19: Secondary | ICD-10-CM | POA: Diagnosis present

## 2020-12-21 DIAGNOSIS — I480 Paroxysmal atrial fibrillation: Secondary | ICD-10-CM | POA: Diagnosis not present

## 2020-12-21 DIAGNOSIS — K5900 Constipation, unspecified: Secondary | ICD-10-CM | POA: Diagnosis not present

## 2020-12-21 DIAGNOSIS — R159 Full incontinence of feces: Secondary | ICD-10-CM | POA: Diagnosis present

## 2020-12-21 DIAGNOSIS — I739 Peripheral vascular disease, unspecified: Secondary | ICD-10-CM | POA: Diagnosis present

## 2020-12-21 DIAGNOSIS — R4701 Aphasia: Secondary | ICD-10-CM | POA: Diagnosis not present

## 2020-12-21 DIAGNOSIS — L89159 Pressure ulcer of sacral region, unspecified stage: Secondary | ICD-10-CM | POA: Diagnosis present

## 2020-12-21 DIAGNOSIS — Z803 Family history of malignant neoplasm of breast: Secondary | ICD-10-CM

## 2020-12-21 DIAGNOSIS — I5033 Acute on chronic diastolic (congestive) heart failure: Secondary | ICD-10-CM | POA: Diagnosis present

## 2020-12-21 DIAGNOSIS — G4700 Insomnia, unspecified: Secondary | ICD-10-CM | POA: Diagnosis not present

## 2020-12-21 DIAGNOSIS — Z87891 Personal history of nicotine dependence: Secondary | ICD-10-CM | POA: Diagnosis not present

## 2020-12-21 DIAGNOSIS — Z7951 Long term (current) use of inhaled steroids: Secondary | ICD-10-CM

## 2020-12-21 DIAGNOSIS — Z79899 Other long term (current) drug therapy: Secondary | ICD-10-CM

## 2020-12-21 DIAGNOSIS — I69391 Dysphagia following cerebral infarction: Secondary | ICD-10-CM | POA: Diagnosis not present

## 2020-12-21 DIAGNOSIS — Z7901 Long term (current) use of anticoagulants: Secondary | ICD-10-CM | POA: Diagnosis not present

## 2020-12-21 DIAGNOSIS — Z8249 Family history of ischemic heart disease and other diseases of the circulatory system: Secondary | ICD-10-CM | POA: Diagnosis not present

## 2020-12-21 DIAGNOSIS — Z743 Need for continuous supervision: Secondary | ICD-10-CM | POA: Diagnosis not present

## 2020-12-21 DIAGNOSIS — R531 Weakness: Secondary | ICD-10-CM | POA: Diagnosis not present

## 2020-12-21 DIAGNOSIS — I110 Hypertensive heart disease with heart failure: Secondary | ICD-10-CM | POA: Diagnosis present

## 2020-12-21 DIAGNOSIS — R799 Abnormal finding of blood chemistry, unspecified: Secondary | ICD-10-CM | POA: Diagnosis not present

## 2020-12-21 DIAGNOSIS — F419 Anxiety disorder, unspecified: Secondary | ICD-10-CM | POA: Diagnosis present

## 2020-12-21 DIAGNOSIS — J439 Emphysema, unspecified: Secondary | ICD-10-CM | POA: Diagnosis not present

## 2020-12-21 DIAGNOSIS — K219 Gastro-esophageal reflux disease without esophagitis: Secondary | ICD-10-CM | POA: Diagnosis not present

## 2020-12-21 DIAGNOSIS — G459 Transient cerebral ischemic attack, unspecified: Secondary | ICD-10-CM | POA: Diagnosis not present

## 2020-12-21 DIAGNOSIS — M810 Age-related osteoporosis without current pathological fracture: Secondary | ICD-10-CM | POA: Diagnosis not present

## 2020-12-21 DIAGNOSIS — E7800 Pure hypercholesterolemia, unspecified: Secondary | ICD-10-CM | POA: Diagnosis not present

## 2020-12-21 DIAGNOSIS — I482 Chronic atrial fibrillation, unspecified: Secondary | ICD-10-CM | POA: Diagnosis not present

## 2020-12-21 DIAGNOSIS — I10 Essential (primary) hypertension: Secondary | ICD-10-CM | POA: Diagnosis not present

## 2020-12-21 LAB — BASIC METABOLIC PANEL
Anion gap: 8 (ref 5–15)
BUN: 28 mg/dL — ABNORMAL HIGH (ref 8–23)
CO2: 31 mmol/L (ref 22–32)
Calcium: 8.9 mg/dL (ref 8.9–10.3)
Chloride: 98 mmol/L (ref 98–111)
Creatinine, Ser: 0.54 mg/dL (ref 0.44–1.00)
GFR, Estimated: >60 mL/min (ref >60)
Glucose, Bld: 82 mg/dL (ref 70–99)
Potassium: 4.8 mmol/L (ref 3.5–5.1)
Sodium: 137 mmol/L (ref 135–145)

## 2020-12-21 LAB — CBC
HCT: 42.9 % (ref 36.0–46.0)
Hemoglobin: 13.5 g/dL (ref 12.0–15.0)
MCH: 31 pg (ref 26.0–34.0)
MCHC: 31.5 g/dL (ref 30.0–36.0)
MCV: 98.6 fL (ref 80.0–100.0)
Platelets: 232 10*3/uL (ref 150–400)
RBC: 4.35 MIL/uL (ref 3.87–5.11)
RDW: 17.1 % — ABNORMAL HIGH (ref 11.5–15.5)
WBC: 9.6 10*3/uL (ref 4.0–10.5)
nRBC: 0 % (ref 0.0–0.2)

## 2020-12-21 LAB — GLUCOSE, CAPILLARY
Glucose-Capillary: 90 mg/dL (ref 70–99)
Glucose-Capillary: 86 mg/dL (ref 70–99)
Glucose-Capillary: 161 mg/dL — ABNORMAL HIGH (ref 70–99)
Glucose-Capillary: 132 mg/dL — ABNORMAL HIGH (ref 70–99)
Glucose-Capillary: 150 mg/dL — ABNORMAL HIGH (ref 70–99)

## 2020-12-21 MED ORDER — APIXABAN 5 MG PO TABS
5.0000 mg | ORAL_TABLET | Freq: Two times a day (BID) | ORAL | Status: DC
  Administered 2020-12-21 – 2021-01-15 (×50): 5 mg
  Filled 2020-12-21 (×50): qty 1

## 2020-12-21 MED ORDER — INSULIN ASPART 100 UNIT/ML IJ SOLN
0.0000 [IU] | INTRAMUSCULAR | Status: DC
  Administered 2020-12-21 – 2020-12-22 (×5): 1 [IU] via SUBCUTANEOUS
  Administered 2020-12-23 (×2): 2 [IU] via SUBCUTANEOUS
  Administered 2020-12-25 (×2): 1 [IU] via SUBCUTANEOUS

## 2020-12-21 MED ORDER — IPRATROPIUM-ALBUTEROL 0.5-2.5 (3) MG/3ML IN SOLN
3.0000 mL | RESPIRATORY_TRACT | Status: DC | PRN
  Administered 2021-01-15: 3 mL via RESPIRATORY_TRACT
  Filled 2020-12-21 (×2): qty 3

## 2020-12-21 MED ORDER — ACETAMINOPHEN 160 MG/5ML PO SOLN
650.0000 mg | ORAL | Status: DC | PRN
  Administered 2020-12-25 – 2021-01-07 (×7): 650 mg
  Filled 2020-12-21 (×9): qty 20.3

## 2020-12-21 MED ORDER — POTASSIUM CHLORIDE 20 MEQ PO PACK
20.0000 meq | PACK | Freq: Every day | ORAL | Status: DC
  Administered 2020-12-21 – 2020-12-22 (×2): 20 meq
  Filled 2020-12-21 (×2): qty 1

## 2020-12-21 MED ORDER — FOOD THICKENER (SIMPLYTHICK): 1.0000 | ORAL | Status: DC | PRN

## 2020-12-21 MED ORDER — FREE WATER
200.0000 mL | Freq: Four times a day (QID) | Status: DC
  Administered 2020-12-21 – 2020-12-22 (×3): 200 mL

## 2020-12-21 MED ORDER — EZETIMIBE 10 MG PO TABS
10.0000 mg | ORAL_TABLET | Freq: Every day | ORAL | Status: DC
  Administered 2020-12-22 – 2021-01-15 (×25): 10 mg
  Filled 2020-12-21 (×26): qty 1

## 2020-12-21 MED ORDER — JEVITY 1.2 CAL PO LIQD
356.0000 mL | Freq: Four times a day (QID) | ORAL | Status: DC
  Administered 2020-12-21 – 2020-12-31 (×24): 356 mL
  Administered 2020-12-31: 237 mL
  Administered 2021-01-01 (×3): 356 mL
  Filled 2020-12-21 (×50): qty 474

## 2020-12-21 MED ORDER — BUPROPION HCL 75 MG PO TABS
75.0000 mg | ORAL_TABLET | Freq: Two times a day (BID) | ORAL | Status: DC
  Administered 2020-12-21 – 2021-01-15 (×50): 75 mg
  Filled 2020-12-21 (×51): qty 1

## 2020-12-21 MED ORDER — METOPROLOL TARTRATE 12.5 MG HALF TABLET
12.5000 mg | ORAL_TABLET | Freq: Two times a day (BID) | ORAL | Status: DC
  Administered 2020-12-21 – 2021-01-02 (×24): 12.5 mg
  Filled 2020-12-21 (×24): qty 1

## 2020-12-21 MED ORDER — BUDESONIDE 0.5 MG/2ML IN SUSP
0.5000 mg | Freq: Two times a day (BID) | RESPIRATORY_TRACT | Status: DC
  Administered 2020-12-21 – 2021-01-14 (×42): 0.5 mg via RESPIRATORY_TRACT
  Filled 2020-12-21 (×55): qty 2

## 2020-12-21 MED ORDER — AMIODARONE HCL 200 MG PO TABS
200.0000 mg | ORAL_TABLET | Freq: Every day | ORAL | Status: DC
  Administered 2020-12-22 – 2021-01-15 (×25): 200 mg
  Filled 2020-12-21 (×27): qty 1

## 2020-12-21 MED ORDER — REVEFENACIN 175 MCG/3ML IN SOLN
175.0000 ug | Freq: Every day | RESPIRATORY_TRACT | Status: DC
  Administered 2020-12-22 – 2021-01-04 (×14): 175 ug via RESPIRATORY_TRACT
  Filled 2020-12-21 (×14): qty 3

## 2020-12-21 MED ORDER — PANTOPRAZOLE SODIUM 40 MG PO PACK
40.0000 mg | PACK | Freq: Every day | ORAL | Status: DC
  Administered 2020-12-21 – 2020-12-27 (×7): 40 mg
  Filled 2020-12-21 (×7): qty 20

## 2020-12-21 MED ORDER — ARFORMOTEROL TARTRATE 15 MCG/2ML IN NEBU
15.0000 ug | INHALATION_SOLUTION | Freq: Two times a day (BID) | RESPIRATORY_TRACT | Status: DC
  Administered 2020-12-21 – 2021-01-04 (×27): 15 ug via RESPIRATORY_TRACT
  Filled 2020-12-21 (×30): qty 2

## 2020-12-21 NOTE — Progress Notes (Signed)
Patient arrived at approximately 1545. Patient does not speak, does not node her head, occasionally shrugs her left shoulder. Husband and son at bedside. Lungs clear bilat no cough or congestion noted. Patient peg tube patient, continues on bolus feeding, with 200 ml H20 flush. Buttock had MAD, and foam dressing applied perfoliately. No S/S of distress, no S/S of pain. Flat call light within reach. Bed in low position, safety measures in place.

## 2020-12-21 NOTE — Care Management Important Message (Signed)
Important Message  Patient Details  Name: Beverly Li MRN: [id number] Date of Birth: [birth year]   Medicare Important Message Given:  Yes     Renie Ora 12/21/2020, 10:13 AM

## 2020-12-21 NOTE — Progress Notes (Signed)
Inpatient Rehab Admissions Coordinator:    I have insurance approval and a bed available for pt to admit to CIR today. Dr. Allena Katz in agreement.  Will let pt/family and TOC team know.   Estill Dooms, PT, DPT Admissions Coordinator [phone] 12/21/20  10:12 AM

## 2020-12-21 NOTE — Progress Notes (Signed)
PMR Admission Coordinator Pre-Admission Assessment  Patient: Beverly Li is an 75 y.o., female MRN: [id number] DOB: 01/08/1946 Height: 5' 3" (160 cm) Weight: 50.8 kg  Insurance Information HMO:     PPO: yes     PCP:      IPA:      80/20:      OTHER:  PRIMARY: [employer] Medicare      Policy#: [id number]      Subscriber: pt CM Name:       Phone#: [phone]     Fax#: [phone] Pre-Cert#: [id number] auth for CIR given by Teresa with updates due to fax listed above on 9/14      Employer:  Benefits:  Phone #: [phone]     Name:  Eff. Date: 04/15/20     Deduct: $0      Out of Pocket Max: $3900 (met $720.69)      Life Max: n/a CIR: $295/day for days 1-5      SNF: 20 full days Outpatient:      Co-Pay: $20/visit Home Health: 100%      Co-Pay:  DME: 80%     Co-Ins: 20% Providers:  SECONDARY:       Policy#:      Phone#:   Financial Counselor:       Phone#:   The "Data Collection Information Summary" for patients in Inpatient Rehabilitation Facilities with attached "Privacy Act Statement-Health Care Records" was provided and verbally reviewed with: Patient and Family  Emergency Contact Information Contact Information     Name Relation Home Work Mobile   Southard,[name] Spouse [phone]  [phone]   Erbe,[name] Son   [phone]       Current Medical History  Patient Admitting Diagnosis: CVA   History of Present Illness: Beverly Li is a 75-year-old right-handed female with history of chronic diastolic congestive heart failure, atrial fibrillation maintained on Eliquis, Bell's palsy, hypertension, OSA, prediabetes, right femoral-popliteal bypass graft stenosis followed by vascular surgery, recent hospitalization 8/1-8/24/2022 for 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  She was discharged to skilled nursing facility.  Presented 12/12/2020 with labored breathing as well as  low-grade fever.  Noted admission chemistries WBC 19,000 BUN 34 troponin high-sensitivity 173.  Chest x-ray noted increased right pleural effusion with worsening opacity in the right lung base with unchanged left pleural effusion.  Placed on broad-spectrum antibiotics.  Blood cultures remain negative.  She continued on Eliquis.  N.p.o. with PEG tube feeds ongoing as prior to admission.  Antibiotic therapy has been completed.  Therapy evaluations completed due to patient decreased functional ability with recent history of CVA she was recommended for a comprehensive rehab program.    Patient's medical record from [place] has been reviewed by the rehabilitation admission coordinator and physician.  Past Medical History  Past Medical History:  Diagnosis Date   Anxiety    Arthritis    "some in my lower back; probably elbows, knees" (11/18/2017)   Atrial fibrillation (HCC)    Bell's palsy    when pt. was 75 yrs old, when under stress the left side of face will droop.   Complication of anesthesia    "vascular OR 2016; BP bottomed out; couldn't get it regulated; ended up in ICU for DAYS" (11/18/2017)   GERD (gastroesophageal reflux disease)    History of kidney stones    Hypertension    Hypertrophic cardiomyopathy (HCC)      severe LV basilar hypertrophy witn no evidence of significant outflow tract obstruction, EF 65-70%, mild LAE, mild TR, grade 1a diastolic dysfunction 05/15/10 (Dr. Mark Skains) (Atrial Septal Hypertrophy pattern)-- Intra-op TEE with dsignificant outflow tract obstruction - AI, MR & TR   Insomnia    Mild aortic sclerosis    Osteopenia    Peripheral vascular disease (HCC)    Syncope    , Vagal    Has the patient had major surgery during 100 days prior to admission? Yes  Family History   family history includes Breast cancer in her sister; Cancer in her father, mother, and sister; Hypertension in her mother; Liver cancer in her mother; Lung cancer in her father.  Current  Medications  Current Facility-Administered Medications:    acetaminophen (TYLENOL) 160 MG/5ML solution 650 mg, 650 mg, Per Tube, Q4H PRN, Smith, Rondell A, MD   amiodarone (PACERONE) tablet 200 mg, 200 mg, Per Tube, Daily, Smith, Rondell A, MD, 200 mg at 12/20/20 0907   apixaban (ELIQUIS) tablet 5 mg, 5 mg, Per Tube, BID, Smith, Rondell A, MD, 5 mg at 12/21/20 0826   arformoterol (BROVANA) nebulizer solution 15 mcg, 15 mcg, Nebulization, BID, Smith, Rondell A, MD, 15 mcg at 12/21/20 0719   budesonide (PULMICORT) nebulizer solution 0.5 mg, 0.5 mg, Nebulization, BID, Smith, Rondell A, MD, 0.5 mg at 12/21/20 0719   buPROPion (WELLBUTRIN) tablet 75 mg, 75 mg, Per Tube, BID, Smith, Rondell A, MD, 75 mg at 12/21/20 0826   ezetimibe (ZETIA) tablet 10 mg, 10 mg, Per Tube, Daily, Smith, Rondell A, MD, 10 mg at 12/20/20 0907   feeding supplement (JEVITY 1.2 CAL) liquid 356 mL, 356 mL, Per Tube, QID, Joseph, Preetha, MD, 356 mL at 12/21/20 0826   free water 200 mL, 200 mL, Per Tube, QID, Joseph, Preetha, MD, 200 mL at 12/21/20 0835   insulin aspart (novoLOG) injection 0-9 Units, 0-9 Units, Subcutaneous, Q4H, Smith, Rondell A, MD, 2 Units at 12/21/20 0140   ipratropium-albuterol (DUONEB) 0.5-2.5 (3) MG/3ML nebulizer solution 3 mL, 3 mL, Nebulization, Q4H PRN, Joseph, Preetha, MD   MEDLINE mouth rinse, 15 mL, Mouth Rinse, BID, Joseph, Preetha, MD, 15 mL at 12/20/20 2148   metoprolol tartrate (LOPRESSOR) tablet 12.5 mg, 12.5 mg, Per Tube, BID, Smith, Rondell A, MD, 12.5 mg at 12/20/20 2147   pantoprazole sodium (PROTONIX) 40 mg oral suspension 40 mg, 40 mg, Per Tube, QHS, Smith, Rondell A, MD, 40 mg at 12/20/20 2147   potassium chloride (KLOR-CON) packet 20 mEq, 20 mEq, Per Tube, Daily, Joseph, Preetha, MD, 20 mEq at 12/20/20 0907   revefenacin (YUPELRI) nebulizer solution 175 mcg, 175 mcg, Nebulization, Daily, Smith, Rondell A, MD, 175 mcg at 12/21/20 0720  Patients Current Diet:  Diet Order              Diet NPO time specified  Diet effective now                   Precautions / Restrictions Precautions Precautions: Fall, Other (comment) Precaution Comments: R hemiplegia with R shoulder subluxation, PEG, adominal binder; bladder/bowel incontinence Restrictions Weight Bearing Restrictions: No   Has the patient had 2 or more falls or a fall with injury in the past year? Yes  Prior Activity Level Community (5-7x/wk): independent prior to admission with no DME, driving, no assist for I/ADL  Prior Functional Level Self Care: Did the patient need help bathing, dressing, using the toilet or eating? Independent  Indoor Mobility: Did the patient need   assistance with walking from room to room (with or without device)? Independent  Stairs: Did the patient need assistance with internal or external stairs (with or without device)? Independent  Functional Cognition: Did the patient need help planning regular tasks such as shopping or remembering to take medications? Independent  Patient Information Are you of Hispanic, Latino/a,or Spanish origin?: X. Patient unable to respond What is your race?: X. Patient unable to respond Do you need or want an interpreter to communicate with a doctor or health care staff?: 9. Unable to respond  Patient's Response To:  Health Literacy and Transportation Is the patient able to respond to health literacy and transportation needs?: No  Home Assistive Devices / Equipment Home Assistive Devices/Equipment: Other (Comment) (From SNF) Home Equipment: Toilet riser, Walker - 2 wheels, Shower seat, Grab bars - tub/shower, Wheelchair - manual, Cane - single point  Prior Device Use: Indicate devices/aids used by the patient prior to current illness, exacerbation or injury? None of the above  Current Functional Level Cognition  Overall Cognitive Status: Difficult to assess Difficult to assess due to: Impaired communication Current Attention Level:  Focused Orientation Level: Other (comment) (She is unable to speak, I could not assess this) Following Commands: Follows multi-step commands inconsistently, Follows one step commands inconsistently, Follows one step commands with increased time Safety/Judgement: Decreased awareness of safety, Decreased awareness of deficits General Comments: Pt beginning to show more of her personality by shrugging shoulders, rolling eyes, giggling, or smiling. Still not verbalizing words though this session. Pt follows simple commands with delay inconsistently. Requires multi-modal cues to sequence and perform all tasks.    Extremity Assessment (includes Sensation/Coordination)  Upper Extremity Assessment: RUE deficits/detail RUE Deficits / Details: Flaccid with no attention or activition of RUE. no responce to painful stimuli RUE Sensation: decreased light touch RUE Coordination: decreased fine motor, decreased gross motor LUE Deficits / Details: Using RUE to wash her face. Difficulty with motor planning. decreased strength LUE Coordination: decreased fine motor  Lower Extremity Assessment: Defer to PT evaluation RLE Deficits / Details: No active movement noted in RLE; good passive ROM, but noted calf/ankle tightness due to resting in PF RLE Coordination: decreased fine motor, decreased gross motor LLE Deficits / Details: Generalized weakness; able to accept weight on LLE in standing without knee buckling, attempting to hop on L foot despite R knee buckling LLE Coordination: decreased gross motor    ADLs  Overall ADL's : Needs assistance/impaired Eating/Feeding: NPO Grooming: Sitting, Maximal assistance Grooming Details (indicate cue type and reason): sitting on staady at the sink. incrased assist this session due to hand over hand use of R hand to complete grooming tasks. vc for looking into mirror and lookign at R hand during task Upper Body Bathing: Sitting, Maximal assistance Lower Body Bathing: Total  assistance, +2 for physical assistance, Sit to/from stand Upper Body Dressing : Maximal assistance, Sitting Lower Body Dressing: Total assistance, +2 for physical assistance, Sit to/from stand Lower Body Dressing Details (indicate cue type and reason): don socks Toilet Transfer: Minimal assistance, Maximal assistance (sit<>stand with stedy at recliner) Toilet Transfer Details (indicate cue type and reason): Min A for power up from EOB. Pt then able to perform sit<>stand from stedy seat during peri care. As pt fatigues , she required Max A for power up Toileting- Clothing Manipulation and Hygiene: Maximal assistance, +2 for physical assistance Toileting - Clothing Manipulation Details (indicate cue type and reason): one person for maintaining standing and second person for peri care Functional mobility   during ADLs: Minimal assistance, Total assistance (min A +2 for sit<>stand with use of steady frame - total A for transfers with steady) General ADL Comments: pt required incrased cueing and time for processing for all ADL tasks    Mobility  Overal bed mobility: Needs Assistance Bed Mobility: Supine to Sit Rolling: Max assist Sidelying to sit: Mod assist, +2 for physical assistance, +2 for safety/equipment Supine to sit: Mod assist, HOB elevated General bed mobility comments: Cues provided to manage legs to L EOB, needing assistance at R with intermittent muscle activation noted. Assistance provided to place R hand on chest and rotate trunk onto L elbow, but pt repositioning L elbow and displaying good initiation with delay to push up on L hand to ascend trunk from elevated HOB, modA.    Transfers  Overall transfer level: Needs assistance Equipment used: 2 person hand held assist Transfer via Lift Equipment: Stedy Transfers: Sit to/from Stand, Stand Pivot Transfers Sit to Stand: Mod assist, +2 safety/equipment Stand pivot transfers: Max assist, +2 physical assistance, +2  safety/equipment General transfer comment: Sit to stand 1x from EOB and 1x from recliner, cuing for hand placement and hand-over-hand R hand placement to assist with pushing up to stand. R knee blocked anteriorly and posteriorly to avoid buckling and hyperextension, modA to power up to stand, +2 for safety. MaxAx2 to steady pt, block R knee, provide UE support, shift weight, and advance legs to stand step to L.    Ambulation / Gait / Stairs / Wheelchair Mobility  Ambulation/Gait Ambulation/Gait assistance: Max assist, +2 physical assistance, +2 safety/equipment Gait Distance (Feet): 2 Feet (x2 bouts of ~2 ft first bout and then focused on weight shifting with L foot advancement standing in front of chair 2nd bout) Assistive device: 2 person hand held assist Gait Pattern/deviations: Step-to pattern, Decreased weight shift to right, Decreased weight shift to left, Trunk flexed, Narrow base of support, Decreased stride length General Gait Details: MaxAx2 to steady pt, block R knee, provide UE support, shift weight, and advance legs to stand step to L 1x. Pt displays poor weight shifting to R to allow L foot to clear the ground, needing assistance to slide her L foot to step to L. Thus, second bout focused on weight shifting to R with R knee blocked anteriorly and posteriorly to prevent buckling and hyperextension while cuing pt to lift her L foot off the ground. Pt showing attempts to lift her leg but needed physical assistance to lift her L leg due to likely distrust of her R leg. Needed physical assistance and cues to improve trunk postue. Gait velocity: decreased Gait velocity interpretation: <1.31 ft/sec, indicative of household ambulator    Posture / Balance Dynamic Sitting Balance Sitting balance - Comments: UE support and minA to sit statically EOB. Balance Overall balance assessment: Needs assistance Sitting-balance support: Single extremity supported, Feet supported Sitting balance-Leahy  Scale: Poor Sitting balance - Comments: UE support and minA to sit statically EOB. Postural control: Posterior lean, Left lateral lean Standing balance support: Bilateral upper extremity supported, During functional activity Standing balance-Leahy Scale: Poor Standing balance comment: Bil HHA and modA with R knee blocked to stand statically. MaxAx2 to attempt to ambulate.    Special needs/care consideration Oxygen 2L (new need), Skin PEG site, and Diabetic management yes   Previous Home Environment (from acute therapy documentation) Living Arrangements: Spouse/significant other Available Help at Discharge: Family, Available 24 hours/day Type of Home: Skilled Nursing Facility Home Layout: Two level, Able to live   extremity supported, During functional activity Standing balance-Leahy Scale: Poor Standing balance comment: Bil HHA and modA with R knee blocked to stand statically. MaxAx2 to attempt to ambulate.     Special needs/care consideration Oxygen 2L (new need), Skin PEG site, and Diabetic management yes    Previous Home Environment (from acute therapy documentation) Living Arrangements: Spouse/significant other Available Help at Discharge: Family, Available 24 hours/day Type of Home: [place]: Two level, Able to live on main level with bedroom/bathroom Home Access: Stairs to enter Entrance Stairs-Rails: None Entrance Stairs-Number of Steps: 1 Bathroom Shower/Tub: Chiropodist: [place]: No Additional Comments: Recent admission for stroke with d/c to [facility] 12/06/20. Husband reports unsure if pt to return home, or if able to come home with hired caregivers   Discharge Living Setting Plans for Discharge Living Setting: Patient's home, Lives with (comment) (husband) Type of Home at Discharge: House Discharge Home Layout: 1/2 bath on main level Discharge Home Access: Stairs to enter Entrance Stairs-Rails: None Entrance Stairs-Number of Steps: 1 Discharge Bathroom Shower/Tub: Tub/shower unit Discharge Bathroom Toilet: Standard Does the patient have any problems obtaining your medications?: No   Social/Family/Support Systems Patient Roles: Spouse Contact Information: spouse, Ajayla Iglesias Anticipated Caregiver: hired care givers, son Alvester Chou, is primary contact Anticipated Caregiver's Contact Information: [phone] Ability/Limitations of Caregiver: husband is unable to provide much physical  assist, son and other family members work; Secretary/administrator on hiring caregivers for transition home after rehab prior to LTC Caregiver Availability: 24/7 Discharge Plan Discussed with Primary Caregiver: Yes Is Caregiver In Agreement with Plan?: Yes Does Caregiver/Family have Issues with Lodging/Transportation while Pt is in Rehab?: No   Goals Patient/Family Goal for Rehab: PT min assist w/c level, OT min/mod assist, SLP mod/max assist Expected length of stay: 30-32 days Pt/Family Agrees to Admission and willing to participate: Yes Program Orientation Provided & Reviewed with Pt/Caregiver Including Roles  & Responsibilities: Yes  Barriers to Discharge: Insurance for SNF coverage   Decrease burden of Care through IP rehab admission: n/a   Possible need for SNF placement upon discharge: No. Plan for d/c home with family and hired caregiver support.    Patient Condition: I have reviewed medical records from [facility], spoken with CM, and patient, spouse, and son. I met with patient at the bedside for inpatient rehabilitation assessment.  Patient will benefit from ongoing PT, OT, and SLP, can actively participate in 3 hours of therapy a day 5 days of the week, and can make measurable gains during the admission.  Patient will also benefit from the coordinated team approach during an Inpatient Acute Rehabilitation admission.  The patient will receive intensive therapy as well as Rehabilitation physician, nursing, social worker, and care management interventions.  Due to bladder management, bowel management, safety, skin/wound care, disease management, medication administration, pain management, and patient education the patient requires 24 hour a day rehabilitation nursing.  The patient is currently max +2 with mobility and basic ADLs.  Discharge setting and therapy post discharge at home with home health is anticipated.  Patient has agreed to participate in the Acute Inpatient Rehabilitation Program and will admit  today.   Preadmission Screen Completed By:  Michel Santee, PT, DPT 12/21/2020 10:13 AM ______________________________________________________________________   Discussed status with Dr. Dagoberto Ligas on 12/21/20  at 10:13 AM  and received approval for admission today.   Admission Coordinator:  Michel Santee, PT, DPT time 10:13 AM Sudie Grumbling 12/21/20  Assessment/Plan: Diagnosis: Does the need for close, 24 hr/day Medical supervision in concert with the patient's rehab needs make it unreasonable for this patient to be served in a less intensive setting? Yes Co-Morbidities requiring supervision/potential complications: dysphagia due to L MCA stroke with new PEG; Aspiration pneumonia; dCHF, Afib on Eliquis; Bell's palsy; HTN, prediabetes Due to bladder management, bowel management, safety, skin/wound care, disease management, medication administration, pain management, and patient education, does the patient require 24 hr/day rehab nursing? Yes Does the patient require coordinated care of a physician, rehab nurse, PT, OT, and SLP to address physical and functional deficits in the context of the above medical diagnosis(es)? Yes Addressing deficits in the following areas: balance, endurance, locomotion, strength, transferring, bowel/bladder control, bathing, dressing, feeding, grooming, toileting, cognition, speech, language, swallowing, and psychosocial support Can the patient actively participate in an intensive therapy program of at least 3 hrs of therapy 5 days a week? Yes The potential for patient to make measurable gains while on inpatient rehab is fair Anticipated functional outcomes upon discharge from inpatient rehab: min assist and mod assist PT, mod assist and max assist OT, min assist and mod assist SLP Estimated rehab length of stay to reach the above functional goals is: ~30 days Anticipated discharge destination: Home 10. Overall Rehab/Functional Prognosis: fair     MD Signature:

## 2020-12-21 NOTE — Progress Notes (Signed)
Physical Therapy Treatment Patient Details Name: Beverly Li MRN: [id number] DOB: 1945/07/06 Today's Date: 12/21/2020    History of Present Illness Pt is a 75 y.o. female recently d/c to [facility] for rehab after admission 11/13/20-12/06/20 for multiple infarcts requiring revascularization and intubation, now readmitted 12/12/20 with respiratory distress. CXR noted increased R pleural effusion, unchanged L pleural effusion. Workup for sepsis secondary to PNA; question possibility of bacterial pneumonia versus aspiration with possibility of compounding factor of CHF exacerbation. 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    PT Comments    Focused session on gait training through facilitating R lateral weight shift, R knee stability, and L foot lift while standing in place at EOB, but pt continues to need maxAx2 at this time. Focused remainder of session on neuro-muscular facilitation of R upper and lower extremities through rhythmic initiation and PROM/AAROM PNF techniques in gravity eliminated positions. Noted trace muscle activation intermittently in R UE and improved hip flexor and quads purposeful activation with delay in R lower extremity along with spontaneous R ankle AROM. Will continue to follow acutely. Current recommendations remain appropriate.    Follow Up Recommendations  CIR;Supervision/Assistance - 24 hour     Equipment Recommendations  Wheelchair (measurements PT);Wheelchair cushion (measurements PT);Hospital bed;3in1 (PT);Other (comment) (lift equipment)    Recommendations for Other Services       Precautions / Restrictions Precautions Precautions: Fall;Other (comment) Precaution Comments: R hemiplegia with R shoulder subluxation, PEG, adominal binder; bladder/bowel  incontinence Restrictions Weight Bearing Restrictions: No    Mobility  Bed Mobility Overal bed mobility: Needs Assistance Bed Mobility: Supine to Sit;Rolling;Sit to Supine Rolling: Mod assist;Max assist   Supine to sit: HOB elevated;Max assist Sit to supine: Max assist;HOB elevated   General bed mobility comments: Cues provided to flex knees and reach for rail to assist in rolling, needing mod-maxA to complete and manage R UE and leg to ensure safety. Pt exiting/entering R EOB with maxA for trunk and leg management this date.    Transfers Overall transfer level: Needs assistance Equipment used: 2 person hand held assist Transfers: Sit to/from Stand Sit to Stand: Mod assist;+2 safety/equipment         General transfer comment: Sit to stand 3x from EOB with R knee blocked anteriorly and posteriorly to avoid buckling and hyperextension, modA to power up to stand, +2 for safety.  Ambulation/Gait Ambulation/Gait assistance: Max assist;+2 physical assistance;+2 safety/equipment   Assistive device: 2 person hand held assist Gait Pattern/deviations: Step-to pattern;Decreased weight shift to right;Decreased weight shift to left;Trunk flexed;Narrow base of support;Decreased stride length Gait velocity: decreased Gait velocity interpretation: <1.31 ft/sec, indicative of household ambulator General Gait Details: Focused on weight shifting to R with R knee blocked anteriorly and posteriorly to prevent buckling and hyperextension while cuing pt to lift her L foot off the ground. Pt showing attempts to lift her leg but needed physical assistance to lift her L leg due to likely distrust of her R leg. Needed physical assistance and cues to improve trunk posture and to shift weight to R.   Stairs             Wheelchair Mobility    Modified Rankin (Stroke Patients Only) Modified Rankin (Stroke Patients Only) Pre-Morbid Rankin Score: No symptoms Modified Rankin: Severe disability      Balance Overall balance assessment: Needs assistance Sitting-balance  support: Single extremity supported;Feet supported Sitting balance-Leahy Scale: Poor Sitting balance - Comments: UE support and min-modA to sit statically EOB.   Standing balance support: Bilateral upper extremity supported;During functional activity Standing balance-Leahy Scale: Poor Standing balance comment: Bil HHA and modA with R knee blocked to stand statically. MaxAx2 to attempt to ambulate.                            Cognition Arousal/Alertness: Awake/alert Behavior During Therapy: Flat affect (majority of time but intermittent smiling) Overall Cognitive Status: Difficult to assess Area of Impairment: Following commands;Awareness;Problem solving;Safety/judgement;Attention                   Current Attention Level: Focused   Following Commands: Follows multi-step commands inconsistently;Follows one step commands inconsistently;Follows one step commands with increased time Safety/Judgement: Decreased awareness of safety;Decreased awareness of deficits Awareness: Intellectual Problem Solving: Slow processing;Difficulty sequencing;Decreased initiation;Requires verbal cues;Requires tactile cues General Comments: Pt beginning to show more of her personality by shrugging shoulders, rolling eyes, genlty rubbing therapist's arm or hand, or smiling. Still not verbalizing words though this session, but did moan a couple times. Pt follows simple commands with delay inconsistently. Requires multi-modal cues to sequence and perform all tasks.      Exercises Other Exercises Other Exercises: Rhythmic initiation with PROM/AAROM facilitated through R extremities with rolling anteriorly <> posteriorly towards her L, 10x Other Exercises: PNF D1 flexion/extension PROM/AAROM performed on R UE in semi-L sidelying position, 10x, trace muscle activation intermittently noted Other Exercises: PNF D2 flexion/extension  PROM/AAROM performed on R lower extremity in semi-L sidelying position, 10x, muscle activation noted into hip flexion and knee extension each at about half-way mark    General Comments        Pertinent Vitals/Pain Pain Assessment: Faces Faces Pain Scale: Hurts a little bit Pain Location: generalized grimacing with mobility Pain Descriptors / Indicators: Grimacing Pain Intervention(s): Limited activity within patient's tolerance;Monitored during session;Repositioned    Home Living                      Prior Function            PT Goals (current goals can now be found in the care plan section) Acute Rehab PT Goals Patient Stated Goal: pt did not state; family desires for pt to improve and go to CIR PT Goal Formulation: With patient/family Time For Goal Achievement: 12/27/20 Potential to Achieve Goals: Good Progress towards PT goals: Progressing toward goals    Frequency    Min 4X/week      PT Plan Current plan remains appropriate    Co-evaluation              AM-PAC PT "6 Clicks" Mobility   Outcome Measure  Help needed turning from your back to your side while in a flat bed without using bedrails?: A Lot Help needed moving from lying on your back to sitting on the side of a flat bed without using bedrails?: A Lot Help needed moving to and from a bed to a chair (including a wheelchair)?: Total Help needed standing up from a chair using your arms (e.g., wheelchair or bedside chair)?: A Lot Help needed to walk in hospital room?: Total Help needed climbing 3-5 steps with a railing? : Total 6 Click Score: 9    End of Session Equipment Utilized During Treatment: Gait belt;Oxygen Activity Tolerance: Patient tolerated treatment well Patient left: with  call bell/phone within reach;with family/visitor present;in bed;with bed alarm set Nurse Communication: Mobility status PT Visit Diagnosis: Muscle weakness (generalized) (M62.81);Difficulty in walking, not  elsewhere classified (R26.2);Other symptoms and signs involving the nervous system (R29.898);Hemiplegia and hemiparesis;Unsteadiness on feet (R26.81) Hemiplegia - Right/Left: Right Hemiplegia - dominant/non-dominant: Dominant Hemiplegia - caused by: Cerebral infarction     Time: 9323-5573 PT Time Calculation (min) (ACUTE ONLY): 37 min  Charges:  $Gait Training: 8-22 mins $Neuromuscular Re-education: 8-22 mins                     Raymond Gurney, PT, DPT Acute Rehabilitation Services  Pager: [phone] Office: [phone]    Jewel Baize 12/21/2020, 2:44 PM

## 2020-12-21 NOTE — H&P (Signed)
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  She was discharged to skilled nursing facility.  Per chart review prior to CVA August 2022 patient lives with spouse two-level home bed and bath main level.  She was independent without DME.  Husband planning discharged to home with hired caregivers.  Presented 12/12/2020 with labored breathing as well as low-grade fever.  Noted admission chemistries WBC 19,000 BUN 34 troponin high-sensitivity 173.  Chest x-ray noted increased right pleural effusion with worsening opacity in the right lung base with unchanged left pleural effusion.  Placed on broad-spectrum antibiotics.  Blood cultures remain negative.  She continued on Eliquis.  N.p.o. with PEG tube feeds ongoing as prior to admission.  Antibiotic therapy has been completed.  Therapy evaluations completed due to patient decreased functional ability history of CVA was admitted for a comprehensive rehab program.     Pt nonverbal, but according to hsuabd/son, her LBM was yesterday/today; not clear if she's having any pain- hasn't been taking any tylenol.  Using purewick for voiding- incontinence. Of B/B.  Said "she passed MBS". But doesn't know exactly how.      Review of Systems  Unable to perform ROS: Acuity of condition      Past Medical History:  Diagnosis Date   Anxiety     Arthritis      "some in my lower back; probably elbows,  knees" (11/18/2017)   Atrial fibrillation (HCC)     Bell's palsy      when pt. was 76 yrs old, when under stress the left side of face will droop.   Complication of anesthesia      "vascular OR 2016; BP bottomed out; couldn't get it regulated; ended up in ICU for DAYS" (11/18/2017)   GERD (gastroesophageal reflux disease)     History of kidney stones     Hypertension     Hypertrophic cardiomyopathy (HCC)      severe LV basilar hypertrophy witn no evidence of significant outflow tract obstruction, EF 65-70%, mild LAE, mild TR, grade 1a diastolic dysfunction 05/15/10 (Dr. Donato Schultz) (Atrial Septal Hypertrophy pattern)-- Intra-op TEE with dsignificant outflow tract obstruction - AI, MR & TR   Insomnia     Mild aortic sclerosis     Osteopenia     Peripheral vascular disease (HCC)     Syncope      , Vagal         Past Surgical History:  Procedure Laterality Date   AUGMENTATION MAMMAPLASTY Bilateral     BACK SURGERY       CARDIAC CATHETERIZATION N/A 05/07/2016    Procedure: Left Heart Cath and Coronary Angiography;  Surgeon: Kathleene Hazel, MD;  Location: [facility] INVASIVE CV LAB;  Service: Cardiovascular;  Laterality: N/A;   CARDIOVERSION N/A 09/24/2017    Procedure: CARDIOVERSION;  Surgeon: Laurey Morale, MD;  Location: [facility] ENDOSCOPY;  Service: Cardiovascular;  Laterality: N/A;   DILATION AND CURETTAGE OF UTERUS       ENDARTERECTOMY  FEMORAL Right 03/02/2015    Procedure: ENDARTERECTOMY RIGHT FEMORAL;  Surgeon: Chuck Hint, MD;  Location: [facility] OR;  Service: Vascular;  Laterality: Right;   ESOPHAGOGASTRODUODENOSCOPY (EGD) WITH PROPOFOL N/A 12/01/2020    Procedure: ESOPHAGOGASTRODUODENOSCOPY (EGD) WITH PROPOFOL;  Surgeon: Diamantina Monks, MD;  Location: [facility] ENDOSCOPY;  Service: General;  Laterality: N/A;   FACIAL COSMETIC SURGERY Left 2002    "related to Bell's Palsy @ age 89; left eye/side of face droopy; tried to make area symmetrical"   FEMORAL-POPLITEAL BYPASS GRAFT Right  03/02/2015    Procedure: BYPASS GRAFT FEMORAL-BELOW KNEE POPLITEAL ARTERY;  Surgeon: Chuck Hint, MD;  Location: [facility] OR;  Service: Vascular;  Laterality: Right;   INGUINAL HERNIA REPAIR Bilateral 2002   IR ANGIO INTRA EXTRACRAN SEL COM CAROTID INNOMINATE UNI L MOD SED   11/16/2020   IR CT HEAD LTD   11/13/2020   IR PERCUTANEOUS ART THROMBECTOMY/INFUSION INTRACRANIAL INC DIAG ANGIO   11/13/2020   OVARIAN CYST REMOVAL Left     PEG PLACEMENT N/A 12/01/2020    Procedure: PERCUTANEOUS ENDOSCOPIC GASTROSTOMY (PEG) PLACEMENT;  Surgeon: Diamantina Monks, MD;  Location: [facility] ENDOSCOPY;  Service: General;  Laterality: N/A;   PERIPHERAL VASCULAR CATHETERIZATION N/A 01/16/2015    Procedure: Abdominal Aortogram;  Surgeon: Chuck Hint, MD;  Location: [facility] INVASIVE CV LAB;  Service: Cardiovascular;  Laterality: N/A;   POSTERIOR LUMBAR FUSION   2015    "have plates and screws in there"   RADIOLOGY WITH ANESTHESIA N/A 11/13/2020    Procedure: IR WITH ANESTHESIA;  Surgeon: Radiologist, Medication, MD;  Location: [facility] OR;  Service: Radiology;  Laterality: N/A;   TONSILLECTOMY             Family History  Problem Relation Age of Onset   Liver cancer Mother     Cancer Mother          Liver   Hypertension Mother     Lung cancer Father     Cancer Father          Lung   Breast cancer Sister     Cancer Sister          Breast    Social History:  reports that she quit smoking about 6 years ago. Her smoking use included cigarettes. She has a 50.00 pack-year smoking history. She has never used smokeless tobacco. She reports current alcohol use. She reports that she does not use drugs. Allergies:       Allergies  Allergen Reactions   Amoxicillin Other (See Comments)      UTI Has patient had a PCN reaction causing immediate rash, facial/tongue/throat swelling, SOB or lightheadedness with hypotension: No Has patient had a PCN reaction causing severe rash involving mucus membranes or skin necrosis: No Has  patient had a PCN reaction that required hospitalization: No Has patient had a PCN reaction occurring within the last 10 years: Yes--UTI ONLY If all of the above answers are "NO", then may proceed with Cephalosporin use.     Atenolol Cough   Crestor [Rosuvastatin Calcium] Other (See Comments)      Muscle aches   Pravastatin Other (See Comments)      Muscle aches   Sulfa Antibiotics Nausea Only   Codeine        hallucinations          Medications Prior to Admission  Medication Sig Dispense Refill   amiodarone (PACERONE) 200 MG tablet Place 1 tablet (200 mg total) into feeding tube daily.  apixaban (ELIQUIS) 5 MG TABS tablet Place 1 tablet (5 mg total) into feeding tube 2 (two) times daily. 60 tablet     arformoterol (BROVANA) 15 MCG/2ML NEBU Take 2 mLs (15 mcg total) by nebulization 2 (two) times daily. 120 mL     budesonide (PULMICORT) 0.5 MG/2ML nebulizer solution Take 2 mLs (0.5 mg total) by nebulization 2 (two) times daily.   12   buPROPion (WELLBUTRIN) 75 MG tablet Place 1 tablet (75 mg total) into feeding tube 2 (two) times daily.       ezetimibe (ZETIA) 10 MG tablet Place 1 tablet (10 mg total) into feeding tube daily.       hydrALAZINE (APRESOLINE) 50 MG tablet Place 1 tablet (50 mg total) into feeding tube 2 (two) times daily.       insulin aspart (NOVOLOG FLEXPEN) 100 UNIT/ML FlexPen Inject 1-9 Units into the skin See admin instructions. 70-120=0 units, 121-150=1 unit, 151-200=2 units, 201-250=3 units, 251-300=5 units, 301-350=7 units, 351-400=9 units greater than 400 call MD       metoprolol tartrate (LOPRESSOR) 25 MG tablet Place 0.5 tablets (12.5 mg total) into feeding tube 2 (two) times daily.       Nutritional Supplements (FEEDING SUPPLEMENT, JEVITY 1.2 CAL,) LIQD Place 55 mL/hr into feeding tube continuous.   0   Nutritional Supplements (FEEDING SUPPLEMENT, PROSOURCE TF,) liquid Place 45 mLs into feeding tube 2 (two) times daily.       pantoprazole sodium (PROTONIX) 40  mg PACK Place 20 mLs (40 mg total) into feeding tube at bedtime. 30 packet     revefenacin (YUPELRI) 175 MCG/3ML nebulizer solution Take 3 mLs (175 mcg total) by nebulization daily. 90 mL     Water For Irrigation, Sterile (FREE WATER) SOLN Place 200 mLs into feeding tube every 4 (four) hours.       acetaminophen (TYLENOL) 160 MG/5ML solution Place 20.3 mLs (650 mg total) into feeding tube every 4 (four) hours as needed for mild pain (or temp > 37.5 C (99.5 F)). (Patient not taking: No sig reported) 120 mL 0   albuterol (PROVENTIL) (2.5 MG/3ML) 0.083% nebulizer solution Take 3 mLs (2.5 mg total) by nebulization every 4 (four) hours as needed for wheezing or shortness of breath. (Patient not taking: Reported on 12/12/2020) 75 mL 12      Drug Regimen Review Drug regimen was reviewed and remains appropriate with no significant issues identified   Home: Home Living Family/patient expects to be discharged to:: Inpatient rehab Living Arrangements: Spouse/significant other Available Help at Discharge: Family, Available 24 hours/day Type of Home: Skilled Nursing Facility Home Access: Stairs to enter [employer] of Steps: 1 Entrance Stairs-Rails: None Home Layout: Two level, Able to live on main level with bedroom/bathroom Bathroom Shower/Tub: Engineer, manufacturing systems: Standard Home Equipment: Government social research officer, Environmental consultant - 2 wheels, Shower seat, Grab bars - tub/shower, Wheelchair - manual, Cane - single point Additional Comments: Recent admission for stroke with d/c to [facility] 12/06/20. Husband reports unsure if pt to return home, or if able to come home with hired caregivers   Functional History: Prior Function Level of Independence: Needs assistance Gait / Transfers Assistance Needed: Prior to stroke 11/13/20, pt independent without DME. Since stroke, has been at rehab since 12/06/20 with R-side hemiplegia and aphasia, assist for standing and transfers ADL's / Homemaking Assistance  Needed: Prior to stroke 11/13/20, pt independent with ADLs/iADLs (manages her own finances and medicines without assist). Since stroke, has been at rehab since 12/06/20  with R-side hemiplegia and aphasia, requires assist from staff for all ADLs Communication / Swallowing Assistance Needed: aphasic since cva Comments: Pt was able to perform all ADLs, mobility, and manage finances/meds without assistance.   Functional Status:  Mobility: Bed Mobility Overal bed mobility: Needs Assistance Bed Mobility: Supine to Sit Rolling: Max assist Sidelying to sit: Mod assist, +2 for physical assistance, +2 for safety/equipment Supine to sit: Mod assist, HOB elevated General bed mobility comments: Cues provided to manage legs to L EOB, needing assistance at R with intermittent muscle activation noted. Assistance provided to place R hand on chest and rotate trunk onto L elbow, but pt repositioning L elbow and displaying good initiation with delay to push up on L hand to ascend trunk from elevated HOB, modA. Transfers Overall transfer level: Needs assistance Equipment used: 2 person hand held assist Transfer via Lift Equipment: Stedy Transfers: Sit to/from Stand, [employer] Transfers Sit to Stand: Mod assist, +2 safety/equipment Stand pivot transfers: Max assist, +2 physical assistance, +2 safety/equipment General transfer comment: Sit to stand 1x from EOB and 1x from recliner, cuing for hand placement and hand-over-hand R hand placement to assist with pushing up to stand. R knee blocked anteriorly and posteriorly to avoid buckling and hyperextension, modA to power up to stand, +2 for safety. MaxAx2 to steady pt, block R knee, provide UE support, shift weight, and advance legs to stand step to L. Ambulation/Gait Ambulation/Gait assistance: Max assist, +2 physical assistance, +2 safety/equipment Gait Distance (Feet): 2 Feet (x2 bouts of ~2 ft first bout and then focused on weight shifting with L foot advancement  standing in front of chair 2nd bout) Assistive device: 2 person hand held assist Gait Pattern/deviations: Step-to pattern, Decreased weight shift to right, Decreased weight shift to left, Trunk flexed, Narrow base of support, Decreased stride length General Gait Details: MaxAx2 to steady pt, block R knee, provide UE support, shift weight, and advance legs to stand step to L 1x. Pt displays poor weight shifting to R to allow L foot to clear the ground, needing assistance to slide her L foot to step to L. Thus, second bout focused on weight shifting to R with R knee blocked anteriorly and posteriorly to prevent buckling and hyperextension while cuing pt to lift her L foot off the ground. Pt showing attempts to lift her leg but needed physical assistance to lift her L leg due to likely distrust of her R leg. Needed physical assistance and cues to improve trunk postue. Gait velocity: decreased Gait velocity interpretation: <1.31 ft/sec, indicative of household ambulator   ADL: ADL Overall ADL's : Needs assistance/impaired Eating/Feeding: NPO Grooming: Sitting, Maximal assistance Grooming Details (indicate cue type and reason): sitting on staady at the sink. incrased assist this session due to hand over hand use of R hand to complete grooming tasks. vc for looking into mirror and lookign at R hand during task Upper Body Bathing: Sitting, Maximal assistance Lower Body Bathing: Total assistance, +2 for physical assistance, Sit to/from stand Upper Body Dressing : Maximal assistance, Sitting Lower Body Dressing: Total assistance, +2 for physical assistance, Sit to/from stand Lower Body Dressing Details (indicate cue type and reason): don socks Toilet Transfer: Minimal assistance, Maximal assistance (sit<>stand with stedy at recliner) Toilet Transfer Details (indicate cue type and reason): Min A for power up from EOB. Pt then able to perform sit<>stand from stedy seat during peri care. As pt fatigues , she  required Max A for power up Toileting- Clothing Manipulation and  Hygiene: Maximal assistance, +2 for physical assistance Toileting - Clothing Manipulation Details (indicate cue type and reason): one person for maintaining standing and second person for peri care Functional mobility during ADLs: Minimal assistance, Total assistance (min A +2 for sit<>stand with use of steady frame - total A for transfers with steady) General ADL Comments: pt required incrased cueing and time for processing for all ADL tasks   Cognition: Cognition Overall Cognitive Status: Difficult to assess Orientation Level: Other (comment) (She is unable to speak, I could not assess this) Cognition Arousal/Alertness: Awake/alert Behavior During Therapy: Flat affect (majority of time but intermittent smiling or giggling) Overall Cognitive Status: Difficult to assess Area of Impairment: Following commands, Awareness, Problem solving, Safety/judgement, Attention Current Attention Level: Focused Following Commands: Follows multi-step commands inconsistently, Follows one step commands inconsistently, Follows one step commands with increased time Safety/Judgement: Decreased awareness of safety, Decreased awareness of deficits Awareness: Intellectual Problem Solving: Slow processing, Difficulty sequencing, Decreased initiation, Requires verbal cues, Requires tactile cues General Comments: Pt beginning to show more of her personality by shrugging shoulders, rolling eyes, giggling, or smiling. Still not verbalizing words though this session. Pt follows simple commands with delay inconsistently. Requires multi-modal cues to sequence and perform all tasks. Difficult to assess due to: Impaired communication   Physical Exam: Blood pressure (!) 139/103, pulse 82, temperature 98.5 F (36.9 C), temperature source Axillary, resp. rate 18, height  (1.6 m), weight 50.8 kg, SpO2 98 %. Physical Exam Vitals and nursing note reviewed.  Exam conducted with a chaperone present.  Constitutional:      Comments: Pt awake, and staring at interviewer; but wasn't able to nod head yes or no regularly- husband and son in room, husband doing her TF's via PEG; NAD  HENT:     Head: Normocephalic and atraumatic.     Comments: Unable to smile/stick tongue out- R facial droop at rest    Right Ear: External ear normal.     Left Ear: External ear normal.     Nose: Nose normal. No congestion.     Mouth/Throat:     Mouth: Mucous membranes are dry.     Pharynx: Oropharynx is clear. No oropharyngeal exudate.  Eyes:     General:        Right eye: No discharge.        Left eye: No discharge.     Comments: Following me some around room, however EOMs grossly intact-   Cardiovascular:     Rate and Rhythm: Regular rhythm.     Comments: Sounds in RRR_ not Afib- no JVD Pulmonary:     Comments: CTA B/L- no W/R/R- good air movement   Abdominal:     Comments: PEG tube in place. Abd binder covering it so doesn't pull out.  Abd binder hiked up too far Soft, NT, ND,  (+)BS  Genitourinary:    Comments: Purewick in place- incontinent of bladder and stool.  Musculoskeletal:     Cervical back: Normal range of motion. No rigidity.     Comments: RUE 0/5 except trace in R tricep RLE- trace movement of RLE with yawning but nothing seen voluntary LUE 5-/5 in muscles I could get compliance with exam LLE 5-/5 same  Skin:    Comments: Healed stage II on backside- foam dressing in place- sacrum   Neurological:     Comments: Patient is alert.  Makes eye contact with examiner.  She is aphasic.  Follows some simple commands. Follows <50% of commands today;  Hoffman's RUE- brisk and 2-3 beats clonus RLE No increased tone seen on RUE/RLE as of yet Expressive aphasia; and some receptive aphasia seen also with apraxia? On Left side  Psychiatric:     Comments: Nonverbal- but did appear to have tears in eyes 2 times.       Lab Results Last 48 Hours         Results for orders placed or performed during the hospital encounter of 12/12/20 (from the past 48 hour(s))  Glucose, capillary     Status: Abnormal    Collection Time: 12/19/20 12:45 PM  Result Value Ref Range    Glucose-Capillary 134 (H) 70 - 99 mg/dL      Comment: Glucose reference range applies only to samples taken after fasting for at least 8 hours.  Glucose, capillary     Status: None    Collection Time: 12/19/20  5:10 PM  Result Value Ref Range    Glucose-Capillary 97 70 - 99 mg/dL      Comment: Glucose reference range applies only to samples taken after fasting for at least 8 hours.  Glucose, capillary     Status: Abnormal    Collection Time: 12/19/20  7:57 PM  Result Value Ref Range    Glucose-Capillary 140 (H) 70 - 99 mg/dL      Comment: Glucose reference range applies only to samples taken after fasting for at least 8 hours.  Glucose, capillary     Status: Abnormal    Collection Time: 12/20/20 12:33 AM  Result Value Ref Range    Glucose-Capillary 144 (H) 70 - 99 mg/dL      Comment: Glucose reference range applies only to samples taken after fasting for at least 8 hours.  Glucose, capillary     Status: None    Collection Time: 12/20/20  4:12 AM  Result Value Ref Range    Glucose-Capillary 73 70 - 99 mg/dL      Comment: Glucose reference range applies only to samples taken after fasting for at least 8 hours.  Glucose, capillary     Status: None    Collection Time: 12/20/20  7:45 AM  Result Value Ref Range    Glucose-Capillary 81 70 - 99 mg/dL      Comment: Glucose reference range applies only to samples taken after fasting for at least 8 hours.  Glucose, capillary     Status: Abnormal    Collection Time: 12/20/20 11:46 AM  Result Value Ref Range    Glucose-Capillary 175 (H) 70 - 99 mg/dL      Comment: Glucose reference range applies only to samples taken after fasting for at least 8 hours.  Glucose, capillary     Status: Abnormal    Collection Time: 12/20/20  4:31 PM   Result Value Ref Range    Glucose-Capillary 100 (H) 70 - 99 mg/dL      Comment: Glucose reference range applies only to samples taken after fasting for at least 8 hours.  Glucose, capillary     Status: Abnormal    Collection Time: 12/20/20  8:18 PM  Result Value Ref Range    Glucose-Capillary 125 (H) 70 - 99 mg/dL      Comment: Glucose reference range applies only to samples taken after fasting for at least 8 hours.  Glucose, capillary     Status: Abnormal    Collection Time: 12/20/20 11:52 PM  Result Value Ref Range    Glucose-Capillary 165 (H) 70 - 99 mg/dL  Comment: Glucose reference range applies only to samples taken after fasting for at least 8 hours.  CBC     Status: Abnormal    Collection Time: 12/21/20  2:04 AM  Result Value Ref Range    WBC 9.6 4.0 - 10.5 K/uL    RBC 4.35 3.87 - 5.11 MIL/uL    Hemoglobin 13.5 12.0 - 15.0 g/dL    HCT 92.1 19.4 - 17.4 %    MCV 98.6 80.0 - 100.0 fL    MCH 31.0 26.0 - 34.0 pg    MCHC 31.5 30.0 - 36.0 g/dL    RDW 08.1 (H) 44.8 - 15.5 %    Platelets 232 150 - 400 K/uL    nRBC 0.0 0.0 - 0.2 %      Comment: Performed at [facility], [address] [place], [place] [address]  Basic metabolic panel     Status: Abnormal    Collection Time: 12/21/20  2:04 AM  Result Value Ref Range    Sodium 137 135 - 145 mmol/L    Potassium 4.8 3.5 - 5.1 mmol/L    Chloride 98 98 - 111 mmol/L    CO2 31 22 - 32 mmol/L    Glucose, Bld 82 70 - 99 mg/dL      Comment: Glucose reference range applies only to samples taken after fasting for at least 8 hours.    BUN 28 (H) 8 - 23 mg/dL    Creatinine, Ser 1.49 0.44 - 1.00 mg/dL    Calcium 8.9 8.9 - 70.2 mg/dL    GFR, Estimated >63 >78 mL/min      Comment: (NOTE) Calculated using the CKD-EPI Creatinine Equation (2021)      Anion gap 8 5 - 15      Comment: Performed at [facility], [address], [place], [place] [address]  Glucose, capillary     Status: None    Collection Time: 12/21/20  4:38  AM  Result Value Ref Range    Glucose-Capillary 90 70 - 99 mg/dL      Comment: Glucose reference range applies only to samples taken after fasting for at least 8 hours.  Glucose, capillary     Status: None    Collection Time: 12/21/20  7:28 AM  Result Value Ref Range    Glucose-Capillary 86 70 - 99 mg/dL      Comment: Glucose reference range applies only to samples taken after fasting for at least 8 hours.      Imaging Results (Last 48 hours)  No results found.           Medical Problem List and Plan: 1.  Debility secondary to aspiration pneumonia/recent left MCA, PCA stroke with right hemiplegia aphasia and dysphagia             -patient may  shower if covers PEG             -ELOS/Goals: ~ 30 days- min to mod A 2.  Antithrombotics: -DVT/anticoagulation: Eliquis [employer]: Other (comment)             -antiplatelet therapy: N/A 3. Pain Management: Tylenol as needed 4. Mood: Wellbutrin 75 mg twice daily             -antipsychotic agents: N/A 5. Neuropsych: This patient is not capable of making decisions on her own behalf. 6. Skin/Wound Care: Routine skin checks 7. Fluids/Electrolytes/Nutrition: Routine in and outs with follow-up chemistries 8.  Dysphagia.  Status postgastrostomy tube 12/01/2020  per Dr. [name]- MBS today- don't have results right this moment 9.  Atrial fibrillation.  Eliquis.  Amiodarone 200 mg daily, Lopressor 12.5 mg twice daily.  Cardiac rate controlled 10.  Hyperlipidemia.  Zetia 11.  COPD.Revefenscin nebulizer daily, Brovana as well as Pulmicort 12.  Acute on chronic diastolic congestive heart failure.  Monitor for any signs of fluid overload   Charlton Amor, PA-C 12/21/2020    I have personally performed a face to face diagnostic evaluation of this patient and formulated the key components of the plan.  Additionally, I have personally reviewed laboratory data, imaging studies, as well as relevant notes and concur with the physician assistant's  documentation above.   The patient's status has not changed from the original H&P.  Any changes in documentation from the acute care chart have been noted above.

## 2020-12-21 NOTE — Progress Notes (Addendum)
Modified Barium Swallow Progress Note  Patient Details  Name: Beverly Li MRN: [id number] Date of Birth: 10-07-1945  Today's Date: 12/21/2020  Modified Barium Swallow completed.  Full report located under Chart Review in the Imaging Section.  Brief recommendations include the following:  Clinical Impression  Pt presents with overall improved oropharyngeal function since 8/24 exam. She demonstrates improved awareness and intentional manipulation of solids and liquids.  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  Thin liquids spilled prematurely into pharynx, and on one instance there was trace aspiration - accompanied by a strong cough. Subsequent thin liquid boluses were swallowed with laryngeal vestibule closure occurring in time to protect the airway.  There was good pharyngeal stripping with no post-swallow residue with any consistency.  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  Results of exam/video were reviewed with pt and her husband and with [name], the SLP who will be working with pt on CIR.    Recommend initiating a dysphagia 1 (pureed) diet with nectar thick liquids.  Pt may have sips of thin liquid outside of meals.  Give meds via PEG.  Dietitian to follow re: adjusting tube feedings.  SLP should be able to advance solids and liquids based on clinical performance without repeating MBS.    Swallow Evaluation Recommendations       SLP Diet Recommendations: Dysphagia 1 (Puree) solids; Nectar thick liquid   Liquid Administration via: Cup   Medication Administration: Via alternative means   Supervision: Staff to assist with self feeding   Compensations: Minimize environmental distractions       Oral Care Recommendations: Oral care BID   Other Recommendations: Have oral suction available  Lorry Furber L. Samson Frederic, MA  CCC/SLP Acute Rehabilitation Services Office number [phone] Pager [phone]   [name] 12/21/2020,1:39 PM

## 2020-12-21 NOTE — Progress Notes (Signed)
I attempted to take a full set of vitals, patient's husband refused

## 2020-12-21 NOTE — Progress Notes (Signed)
Inpatient Rehabilitation Medication Review by a Pharmacist  A complete drug regimen review was completed for this patient to identify any potential clinically significant medication issues.  High Risk Drug Classes Is patient taking? Indication by Medication  Antipsychotic No   Anticoagulant Yes Apixaban for afib  Antibiotic No   Opioid No   Antiplatelet No   Hypoglycemics/insulin Yes SSI for CBG control  Vasoactive Medication Yes Lopressor for HTN  Chemotherapy No   Other Yes Amiodarone for afib     Type of Medication Issue Identified Description of Issue Recommendation(s)  Drug Interaction(s) (clinically significant)     Duplicate Therapy     Allergy     No Medication Administration End Date     Incorrect Dose     Additional Drug Therapy Needed     Significant med changes from prior encounter (inform family/care partners about these prior to discharge). PTA hydralazine d/c'd during acute admit Inform family at discharge from CIR  Other       Clinically significant medication issues were identified that warrant physician communication and completion of prescribed/recommended actions by midnight of the next day:  No  Name of provider notified for urgent issues identified:   Provider Method of Notification:     Pharmacist comments: D/C summary not found at the time of this review. Patient's CIR meds are consistent with acute admit meds and the PTA meds.   Time spent performing this drug regimen review (minutes):  10   Kynleigh Artz A. Jeanella Craze, PharmD, BCPS, FNKF Clinical Pharmacist [place] Please utilize Amion for appropriate phone number to reach the unit pharmacist [facility])  12/21/2020 7:01 PM

## 2020-12-22 DIAGNOSIS — I69391 Dysphagia following cerebral infarction: Secondary | ICD-10-CM | POA: Diagnosis not present

## 2020-12-22 DIAGNOSIS — I63512 Cerebral infarction due to unspecified occlusion or stenosis of left middle cerebral artery: Secondary | ICD-10-CM | POA: Diagnosis not present

## 2020-12-22 DIAGNOSIS — I482 Chronic atrial fibrillation, unspecified: Secondary | ICD-10-CM | POA: Diagnosis not present

## 2020-12-22 DIAGNOSIS — R4701 Aphasia: Secondary | ICD-10-CM

## 2020-12-22 DIAGNOSIS — I639 Cerebral infarction, unspecified: Secondary | ICD-10-CM

## 2020-12-22 LAB — COMPREHENSIVE METABOLIC PANEL
ALT: 47 U/L — ABNORMAL HIGH (ref 0–44)
AST: 35 U/L (ref 15–41)
Albumin: 2.6 g/dL — ABNORMAL LOW (ref 3.5–5.0)
Alkaline Phosphatase: 69 U/L (ref 38–126)
Anion gap: 9 (ref 5–15)
BUN: 24 mg/dL — ABNORMAL HIGH (ref 8–23)
CO2: 29 mmol/L (ref 22–32)
Calcium: 9.2 mg/dL (ref 8.9–10.3)
Chloride: 99 mmol/L (ref 98–111)
Creatinine, Ser: 0.61 mg/dL (ref 0.44–1.00)
GFR, Estimated: >60 mL/min (ref >60)
Glucose, Bld: 91 mg/dL (ref 70–99)
Potassium: 4.9 mmol/L (ref 3.5–5.1)
Sodium: 137 mmol/L (ref 135–145)
Total Bilirubin: 0.6 mg/dL (ref 0.3–1.2)
Total Protein: 5.6 g/dL — ABNORMAL LOW (ref 6.5–8.1)

## 2020-12-22 LAB — CBC WITH DIFFERENTIAL/PLATELET
Abs Immature Granulocytes: 0.07 10*3/uL (ref 0.00–0.07)
Basophils Absolute: 0 10*3/uL (ref 0.0–0.1)
Basophils Relative: 1 %
Eosinophils Absolute: 0.1 10*3/uL (ref 0.0–0.5)
Eosinophils Relative: 1 %
HCT: 43.3 % (ref 36.0–46.0)
Hemoglobin: 13.6 g/dL (ref 12.0–15.0)
Immature Granulocytes: 1 %
Lymphocytes Relative: 17 %
Lymphs Abs: 1.5 10*3/uL (ref 0.7–4.0)
MCH: 31.1 pg (ref 26.0–34.0)
MCHC: 31.4 g/dL (ref 30.0–36.0)
MCV: 99.1 fL (ref 80.0–100.0)
Monocytes Absolute: 0.6 10*3/uL (ref 0.1–1.0)
Monocytes Relative: 6 %
Neutro Abs: 6.5 10*3/uL (ref 1.7–7.7)
Neutrophils Relative %: 74 %
Platelets: 218 10*3/uL (ref 150–400)
RBC: 4.37 MIL/uL (ref 3.87–5.11)
RDW: 17.1 % — ABNORMAL HIGH (ref 11.5–15.5)
WBC: 8.7 10*3/uL (ref 4.0–10.5)
nRBC: 0 % (ref 0.0–0.2)

## 2020-12-22 LAB — GLUCOSE, CAPILLARY
Glucose-Capillary: 83 mg/dL (ref 70–99)
Glucose-Capillary: 89 mg/dL (ref 70–99)
Glucose-Capillary: 82 mg/dL (ref 70–99)
Glucose-Capillary: 142 mg/dL — ABNORMAL HIGH (ref 70–99)
Glucose-Capillary: 129 mg/dL — ABNORMAL HIGH (ref 70–99)
Glucose-Capillary: 137 mg/dL — ABNORMAL HIGH (ref 70–99)

## 2020-12-22 MED ORDER — ENSURE ENLIVE PO LIQD
237.0000 mL | Freq: Two times a day (BID) | ORAL | Status: DC
  Administered 2020-12-22 – 2020-12-30 (×16): 237 mL via ORAL

## 2020-12-22 MED ORDER — FREE WATER
250.0000 mL | Freq: Four times a day (QID) | Status: DC
  Administered 2020-12-22 – 2020-12-25 (×12): 250 mL

## 2020-12-22 NOTE — Progress Notes (Signed)
Inpatient Rehabilitation Care Coordinator Assessment and Plan Patient Details  Name: Beverly Li MRN: [id number] Date of Birth: 1945/10/04  Today's Date: 12/22/2020  Hospital Problems: Principal Problem:   Left middle cerebral artery stroke [facility])  Past Medical History:  Past Medical History:  Diagnosis Date   Anxiety    Arthritis    "some in my lower back; probably elbows, knees" (11/18/2017)   Atrial fibrillation ([place])    Bell's palsy    when pt. was 75 yrs old, when under stress the left side of face will droop.   Complication of anesthesia    "vascular OR 2016; BP bottomed out; couldn't get it regulated; ended up in ICU for DAYS" (11/18/2017)   GERD (gastroesophageal reflux disease)    History of kidney stones    Hypertension    Hypertrophic cardiomyopathy (HCC)    severe LV basilar hypertrophy witn no evidence of significant outflow tract obstruction, EF 65-70%, mild LAE, mild TR, grade 1a diastolic dysfunction 0/26/37 (Dr. Candee Furbish) (Atrial Septal Hypertrophy pattern)-- Intra-op TEE with dsignificant outflow tract obstruction - AI, MR & TR   Insomnia    Mild aortic sclerosis    Osteopenia    Peripheral vascular disease (HCC)    Syncope    , Vagal   Past Surgical History:  Past Surgical History:  Procedure Laterality Date   AUGMENTATION MAMMAPLASTY Bilateral    BACK SURGERY     CARDIAC CATHETERIZATION N/A 05/07/2016   Procedure: Left Heart Cath and Coronary Angiography;  Surgeon: Burnell Blanks, MD;  Location: [place] CV LAB;  Service: Cardiovascular;  Laterality: N/A;   CARDIOVERSION N/A 09/24/2017   Procedure: CARDIOVERSION;  Surgeon: Larey Dresser, MD;  Location: [facility] ENDOSCOPY;  Service: Cardiovascular;  Laterality: N/A;   DILATION AND CURETTAGE OF UTERUS     ENDARTERECTOMY FEMORAL Right 03/02/2015   Procedure: ENDARTERECTOMY RIGHT FEMORAL;  Surgeon: Angelia Mould, MD;  Location: [place];  Service: Vascular;  Laterality: Right;    ESOPHAGOGASTRODUODENOSCOPY (EGD) WITH PROPOFOL N/A 12/01/2020   Procedure: ESOPHAGOGASTRODUODENOSCOPY (EGD) WITH PROPOFOL;  Surgeon: Jesusita Oka, MD;  Location: [place] ENDOSCOPY;  Service: General;  Laterality: N/A;   FACIAL COSMETIC SURGERY Left 2002   "related to [place] @ age 25; left eye/side of face droopy; tried to make area symmetrical"   FEMORAL-POPLITEAL BYPASS GRAFT Right 03/02/2015   Procedure: BYPASS GRAFT FEMORAL-BELOW KNEE POPLITEAL ARTERY;  Surgeon: Angelia Mould, MD;  Location: [place];  Service: Vascular;  Laterality: Right;   INGUINAL HERNIA REPAIR Bilateral 2002   IR ANGIO INTRA EXTRACRAN SEL COM CAROTID INNOMINATE UNI L MOD SED  11/16/2020   IR CT HEAD LTD  11/13/2020   IR PERCUTANEOUS ART THROMBECTOMY/INFUSION INTRACRANIAL INC DIAG ANGIO  11/13/2020   OVARIAN CYST REMOVAL Left    PEG PLACEMENT N/A 12/01/2020   Procedure: PERCUTANEOUS ENDOSCOPIC GASTROSTOMY (PEG) PLACEMENT;  Surgeon: Jesusita Oka, MD;  Location: [place];  Service: General;  Laterality: N/A;   PERIPHERAL VASCULAR CATHETERIZATION N/A 01/16/2015   Procedure: Abdominal Aortogram;  Surgeon: Angelia Mould, MD;  Location: [place] CV LAB;  Service: Cardiovascular;  Laterality: N/A;   POSTERIOR LUMBAR FUSION  2015   "have plates and screws in there"   [place] N/A 11/13/2020   Procedure: IR WITH ANESTHESIA;  Surgeon: Radiologist, Medication, MD;  Location: [place];  Service: Radiology;  Laterality: N/A;   TONSILLECTOMY     Social History:  reports that she quit smoking about 6 years ago.  Her smoking use included cigarettes. She has a 50.00 pack-year smoking history. She has never used smokeless tobacco. She reports current alcohol use. No history on file for drug use.  Family / Support Systems Marital Status: Married How Long?: 72 years Patient Roles: Spouse, Parent Spouse/Significant Other: [name] (husband): [phone] Children: 2 adults sons- Zack and  [place] Other Supports: None reported Anticipated Caregiver: ?Pt husband and hired caregivers Ability/Limitations of Caregiver: TBD Caregiver Availability: 24/7 Family Dynamics: Pt was living in a SNF prior to admission. If pt discharges to home, pt husbnad will hire caregivers.  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 Education: college Field seismologist - How often do you need to have someone help you when you read instructions, pamphlets, or other written material from your doctor or pharmacy?: Patient unable to respond Writes: Yes Employment Status: Unemployed Public relations account executive Issues: Denies Guardian/Conservator: N/A   Abuse/Neglect Abuse/Neglect Assessment Can Be Completed: Unable to assess, patient is non-responsive or altered mental status  Patient response to: Social Isolation - How often do you feel lonely or isolated from those around you?: Patient unable to respond (due to aphasia.)  Emotional Status Pt's affect, behavior and adjustment status: Pt appeared to not be in distress at time of visit. Recent Psychosocial Issues: Pt husband denied Psychiatric History: Pt husband denies Substance Abuse History: Pt husband reports she quit smoking cigarettes over 1 yr ago (atleast 10 per day at the time). Denies EtOH or rec use.  Patient / Family Perceptions, Expectations & Goals Pt/Family understanding of illness & functional limitations: Pt family has a general understanding of pt care needs Premorbid pt/family roles/activities: Independent Anticipated changes in roles/activities/participation: Assistance with ADLs/IADLs Pt/family expectations/goals: Pt husband and son would like for her to recover as much independence, movement, communication as possible.  Community Resources Express Scripts: None Premorbid Home Care/DME Agencies:  None Transportation available at discharge: TBD Is the patient able to respond to transportation needs?: Yes In the past 12 months, has lack of transportation kept you from medical appointments or from getting medications?: No In the past 12 months, has lack of transportation kept you from meetings, work, or from getting things needed for daily living?: No Resource referrals recommended: Neuropsychology  Discharge Planning Living Arrangements: Spouse/significant other Support Systems: Spouse/significant other, Children Type of Residence: Private residence [place] Name: [place] (specify) ([place] Medicare) Financial Resources: Family Support Financial Screen Referred: No Living Expenses: Own Money Management: Spouse Does the patient have any problems obtaining your medications?: No Home Management: Pt and husband managed homecare needs Patient/Family Preliminary Plans: TBD Care Coordinator Barriers to Discharge: Decreased caregiver support, Lack of/limited family support Care Coordinator Anticipated Follow Up Needs: HH/OP  Clinical Impression SW met with pt, pt husband [name] and son Edwyna Ready in room to introduce self, explain role, and discuss discharge process. Pt is not a English as a second language teacher. No HCPOA. No DME.   Beverly Li 12/22/2020, 11:09 AM

## 2020-12-22 NOTE — Evaluation (Signed)
Occupational Therapy Assessment and Plan  Patient Details  Name: Beverly Li MRN: [id number] Date of Birth: [birth year]  OT Diagnosis: apraxia, cognitive deficits, flaccid hemiplegia and hemiparesis, and muscle weakness (generalized) Rehab Potential: Rehab Potential (ACUTE ONLY): Fair ELOS: 4 weeks   Today's Date: 12/22/2020 OT Individual Time: 3825-0539 OT Individual Time Calculation (min): 58 min     Hospital Problem: Principal Problem:   Left middle cerebral artery stroke [facility])  Past Medical History:  Past Medical History:  Diagnosis Date   Anxiety    Arthritis    "some in my lower back; probably elbows, knees" (11/18/2017)   Atrial fibrillation (HCC)    Bell's palsy    when pt. was 75 yrs old, when under stress the left side of face will droop.   Complication of anesthesia    "vascular OR 2016; BP bottomed out; couldn't get it regulated; ended up in ICU for DAYS" (11/18/2017)   GERD (gastroesophageal reflux disease)    History of kidney stones    Hypertension    Hypertrophic cardiomyopathy (HCC)    severe LV basilar hypertrophy witn no evidence of significant outflow tract obstruction, EF 65-70%, mild LAE, mild TR, grade 1a diastolic dysfunction 7/67/34 (Dr. Candee Furbish) (Atrial Septal Hypertrophy pattern)-- Intra-op TEE with dsignificant outflow tract obstruction - AI, MR & TR   Insomnia    Mild aortic sclerosis    Osteopenia    Peripheral vascular disease (HCC)    Syncope    , Vagal   Past Surgical History:  Past Surgical History:  Procedure Laterality Date   AUGMENTATION MAMMAPLASTY Bilateral    BACK SURGERY     CARDIAC CATHETERIZATION N/A 05/07/2016   Procedure: Left Heart Cath and Coronary Angiography;  Surgeon: Burnell Blanks, MD;  Location: [place] CV LAB;  Service: Cardiovascular;  Laterality: N/A;   CARDIOVERSION N/A 09/24/2017   Procedure: CARDIOVERSION;  Surgeon: Larey Dresser, MD;  Location: [facility] ENDOSCOPY;  Service: Cardiovascular;  Laterality:  N/A;   DILATION AND CURETTAGE OF UTERUS     ENDARTERECTOMY FEMORAL Right 03/02/2015   Procedure: ENDARTERECTOMY RIGHT FEMORAL;  Surgeon: Angelia Mould, MD;  Location: [place];  Service: Vascular;  Laterality: Right;   ESOPHAGOGASTRODUODENOSCOPY (EGD) WITH PROPOFOL N/A 12/01/2020   Procedure: ESOPHAGOGASTRODUODENOSCOPY (EGD) WITH PROPOFOL;  Surgeon: Jesusita Oka, MD;  Location: [place] ENDOSCOPY;  Service: General;  Laterality: N/A;   FACIAL COSMETIC SURGERY Left 2002   "related to [place] @ age 34; left eye/side of face droopy; tried to make area symmetrical"   FEMORAL-POPLITEAL BYPASS GRAFT Right 03/02/2015   Procedure: BYPASS GRAFT FEMORAL-BELOW KNEE POPLITEAL ARTERY;  Surgeon: Angelia Mould, MD;  Location: [place];  Service: Vascular;  Laterality: Right;   INGUINAL HERNIA REPAIR Bilateral 2002   IR ANGIO INTRA EXTRACRAN SEL COM CAROTID INNOMINATE UNI L MOD SED  11/16/2020   IR CT HEAD LTD  11/13/2020   IR PERCUTANEOUS ART THROMBECTOMY/INFUSION INTRACRANIAL INC DIAG ANGIO  11/13/2020   OVARIAN CYST REMOVAL Left    PEG PLACEMENT N/A 12/01/2020   Procedure: PERCUTANEOUS ENDOSCOPIC GASTROSTOMY (PEG) PLACEMENT;  Surgeon: Jesusita Oka, MD;  Location: [place];  Service: General;  Laterality: N/A;   PERIPHERAL VASCULAR CATHETERIZATION N/A 01/16/2015   Procedure: Abdominal Aortogram;  Surgeon: Angelia Mould, MD;  Location: [place] CV LAB;  Service: Cardiovascular;  Laterality: N/A;   POSTERIOR LUMBAR FUSION  2015   "have plates and screws in there"   [place] N/A 11/13/2020  Procedure: IR WITH ANESTHESIA;  Surgeon: Radiologist, Medication, MD;  Location: [place];  Service: Radiology;  Laterality: N/A;   TONSILLECTOMY      Assessment & Plan Clinical Impression: Patient is a 75 y.o. year old female with recent admission to the hospital on with history of chronic diastolic congestive heart failure, atrial fibrillation maintained on Eliquis, Bell's palsy,  hypertension, OSA, prediabetes, right femoral-popliteal bypass graft stenosis followed by vascular surgery, recent hospitalization 8/1-8/24/2022 for 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  She was discharged to skilled nursing facility.  Per chart review prior to CVA August 2022 patient lives with spouse two-level home bed and bath main level.  She was independent without DME.  Husband planning discharged to home with hired caregivers.  Presented 12/12/2020 with labored breathing as well as low-grade fever.  Noted admission chemistries WBC 19,000 BUN 34 troponin high-sensitivity 173.  Chest x-ray noted increased right pleural effusion with worsening opacity in the right lung base with unchanged left pleural effusion.  Placed on broad-spectrum antibiotics.  Blood cultures remain negative.  She continued on Eliquis.  N.p.o. with PEG tube feeds ongoing as prior to admission.  Antibiotic therapy has been completed.  Therapy evaluations completed due to patient decreased functional ability history of CVA was admitted for a comprehensive rehab program. Patient transferred to CIR on 12/21/2020 .    Patient currently requires total with basic self-care skills secondary to muscle weakness, decreased cardiorespiratoy endurance and decreased oxygen support, impaired timing and sequencing, abnormal tone, unbalanced muscle activation, motor apraxia, ataxia, decreased coordination, and decreased motor planning, decreased midline orientation, decreased attention to right, and decreased motor planning, decreased initiation, decreased attention, decreased awareness, decreased problem solving, decreased safety awareness, decreased memory, and delayed processing, and decreased sitting balance, decreased standing balance, decreased postural control, hemiplegia, and decreased balance strategies.  Prior to hospitalization, patient  could complete BADL with independent .  Patient will benefit from skilled intervention to decrease level of assist with basic self-care skills prior to discharge home with care partner.  Anticipate patient will require minimal physical assistance and follow up home health.  OT - End of Session Endurance Deficit: Yes Endurance Deficit Description: required rest breaks during mobility tasks OT Assessment Rehab Potential (ACUTE ONLY): Fair OT Patient demonstrates impairments in the following area(s): Balance;Behavior;Cognition;Motor;Edema;Endurance;Nutrition;Pain;Perception;Safety;Sensory;Skin Integrity;Vision OT Basic ADL's Functional Problem(s): Eating;Grooming;Bathing;Dressing;Toileting OT Transfers Functional Problem(s): Toilet;Tub/Shower OT Additional Impairment(s): Fuctional Use of Upper Extremity OT Plan OT Intensity: Minimum of 1-2 x/day, 45 to 90 minutes OT Frequency: 5 out of 7 days OT Duration/Estimated Length of Stay: 4 weeks OT Treatment/Interventions: Balance/vestibular training;Cognitive remediation/compensation;Community reintegration;Discharge planning;Disease mangement/prevention;DME/adaptive equipment instruction;Functional electrical stimulation;Functional mobility training;Neuromuscular re-education;Pain management;Patient/family education;Psychosocial support;Self Care/advanced ADL retraining;Skin care/wound managment;Splinting/orthotics;Therapeutic Activities;Therapeutic Exercise;UE/LE Strength taining/ROM;UE/LE Coordination activities;Wheelchair propulsion/positioning;Visual/perceptual remediation/compensation OT Self Feeding Anticipated Outcome(s): Min A OT Basic Self-Care Anticipated Outcome(s): Min A OT Toileting Anticipated Outcome(s): Min A OT Bathroom Transfers Anticipated Outcome(s): Min A OT Recommendation Patient destination: Home Follow Up Recommendations: Home health OT Equipment Recommended: To be determined  OT Evaluation Precautions/Restrictions   Precautions Precautions: Fall;Other (comment) Precaution Comments: R hemiplegia with R shoulder subluxation, PEG, adominal binder; bladder/bowel incontinence Restrictions Weight Bearing Restrictions: No Pain Pain Assessment Pain Scale: Faces Pain Score: 0-No pain Faces Pain Scale: No hurt PAINAD (Pain Assessment in Advanced Dementia) Breathing: normal Negative Vocalization: none Facial Expression: smiling or inexpressive Body Language: relaxed Consolability: no need to console PAINAD Score: 0  Home Living/Prior Functioning Home Living Family/patient expects to be discharged to:: Skilled nursing facility Living Arrangements: Spouse/significant other Available Help at Discharge: Family, Available 24 hours/day Type of Home: House Home Access: Stairs to enter [employer] of Steps: 2 Entrance Stairs-Rails: None Home Layout: Two level, Able to live on main level with bedroom/bathroom Bathroom Shower/Tub: Chiropodist: Standard Additional Comments: Recent admission for stroke with d/c to [facility] 12/06/20. Husband reports unsure if pt to return home, or if able to come home with hired caregivers  Lives With: Spouse Prior Function Level of Independence: Independent with basic ADLs, Independent with transfers, Independent with homemaking with ambulation, Independent with gait  Able to Take Stairs?: Yes Driving: Yes Comments: Pt was able to perform all ADLs, mobility, and manage finances/meds without assistance. Patient enjoys gardening and spending time at home in [place]. Vision Baseline Vision/History: 1 Wears glasses Ability to See in Adequate Light:  (Aphasic and UTA vision adequately) Patient Visual Report:  (UTA) Vision Assessment?:  (difficult to assess.) Additional Comments: Difficulty tracking Perception  Perception: Impaired Praxis Praxis: Impaired Praxis Impairment Details: Initiation Cognition Overall Cognitive Status:  Difficult to assess Arousal/Alertness: Awake/alert Orientation Level: Nonverbal/unable to assess Year:  (UTA) Month:  (UTA) Day of Week:  (UTA) Memory:  (UTA) Immediate Memory Recall:  (UTA) Memory Recall Sock:  (UTA) Memory Recall Blue:  (UTA) Memory Recall Bed:  (UTA) Attention: Sustained Focused Attention: Appears intact Focused Attention Impairment: Verbal basic;Functional basic Sustained Attention: Impaired Sustained Attention Impairment: Functional basic Awareness: Impaired Awareness Impairment: Emergent impairment Safety/Judgment: Impaired Sensation Sensation Light Touch: Appears Intact Additional Comments: Pt provides infrequent and inconsistent responses, but does react to deep pressure. Coordination Gross Motor Movements are Fluid and Coordinated: No Fine Motor Movements are Fluid and Coordinated: No Motor  Motor Motor: Hemiplegia  Trunk/Postural Assessment  Cervical Assessment Cervical Assessment:  (forward head posture) Thoracic Assessment Thoracic Assessment:  (rounded shoulders) Lumbar Assessment Lumbar Assessment:  (posterior pelvic tilt) Postural Control Postural Control: Deficits on evaluation Righting Reactions: delayed and inadequate Protective Responses: delayed and inadequate  Balance Balance Balance Assessed: Yes Static Sitting Balance Static Sitting - Balance Support: Feet supported Static Sitting - Level of Assistance: 3: Mod assist Dynamic Sitting Balance Dynamic Sitting - Balance Support: Feet supported Dynamic Sitting - Level of Assistance: 2: Max assist Static Standing Balance Static Standing - Balance Support: During functional activity Static Standing - Level of Assistance: 2: Max assist Dynamic Standing Balance Dynamic Standing - Balance Support: During functional activity Dynamic Standing - Level of Assistance: 1: +1 Total assist Extremity/Trunk Assessment RUE Assessment RUE Assessment: Exceptions to [facility] Passive Range of Motion  (PROM) Comments: 0-- RUE Body System: Neuro Brunstrum levels for arm and hand: Arm;Hand Brunstrum level for arm: Stage I Presynergy Brunstrum level for hand: Stage I Flaccidity LUE Assessment LUE Assessment: Within Functional Limits General Strength Comments: generalized weakness  Care Tool Care Tool Self Care Eating   Eating Assist Level: Maximal Assistance - Patient 25 - 49%    Oral Care    Oral Care Assist Level: Dependent - Patient 0%)    Bathing   Body parts bathed by patient: Front perineal area;Abdomen;Chest;Face Body parts bathed by helper: Right arm;Left arm;Buttocks;Right upper leg;Left lower leg;Right lower leg;Left upper leg   Assist Level: Total Assistance - Patient < 25%    Upper Body Dressing(including orthotics)       Assist Level: Total Assistance - Patient < 25%    Lower Body Dressing (excluding footwear)  What is the patient wearing?: Pants Assist for lower body dressing: Dependent - Patient 0%    Putting on/Taking off footwear     Assist for footwear: Dependent - Patient 0%       Care Tool Toileting Toileting activity   Assist for toileting: Dependent - Patient 0%     Care Tool Bed Mobility Roll left and right activity   Roll left and right assist level: Maximal Assistance - Patient 25 - 49%    Sit to lying activity   Sit to lying assist level: Total Assistance - Patient < 25%    Lying to sitting on side of bed activity   Lying to sitting on side of bed assist level: the ability to move from lying on the back to sitting on the side of the bed with no back support.: Total Assistance - Patient < 25%     Care Tool Transfers Sit to stand transfer   Sit to stand assist level: Maximal Assistance - Patient 25 - 49%    Chair/bed transfer   Chair/bed transfer assist level: Maximal Assistance - Patient 25 - 49%     Toilet transfer   Assist Level: Maximal Assistance - Patient 24 - 49%     Care Tool Cognition  Expression of Ideas and Wants  Expression of Ideas and Wants: 1. Rarely/Never expressess or very difficult - rarely/never expresses self or speech is very difficult to understand  Understanding Verbal and Non-Verbal Content Understanding Verbal and Non-Verbal Content: 2. Sometimes understands - understands only basic conversations or simple, direct phrases. Frequently requires cues to understand   Memory/Recall Ability Memory/Recall Ability :  (UTA 2/2 aphasia)   Refer to Care Plan for Long Term Goals  SHORT TERM GOAL WEEK 1 OT Short Term Goal 1 (Week 1): Patient will maintain sitting balance with mod A during functional BADL task OT Short Term Goal 2 (Week 1): Patient will perofrm sit<>stand at the sink with mod A in preparation for BADL task OT Short Term Goal 3 (Week 1): Patient will complete 1 step of UB dressing task with mod questioning cues.  Recommendations for other services: None    Skilled Therapeutic Intervention Pt greeted semi-reclined in bed with spouse present. Pt non-verbal throughout session but able to follow commands 50% of the time within functional BADL tasks. Bed level LB bathing/dressing completed. Pt then transitioned to sitting EOB where we worked on sitting balance within UB bathing/dressing task. Pt with dense R hemiplegia with R UE subluxation. Pt performed 3 sit<>stands with max A and some power up noted. Pt returned to supine with total A. OT educated family on rehab process, POC, ELOS, and OT goals. Pt left semi-reclined in bed with R UE supported on pilllows, bed alarm on, family present, and needs met.   ADL ADL Eating: Maximal assistance Grooming: Maximal assistance Upper Body Bathing: Maximal assistance Lower Body Bathing: Dependent Upper Body Dressing: Dependent Lower Body Dressing: Dependent Toileting: Dependent Toilet Transfer: Maximal assistance Tub/Shower Transfer: Maximal assistance Mobility  Bed Mobility Bed Mobility: Supine to Sit;Sit to Supine Supine to Sit: Total  Assistance - Patient < 25% Sit to Supine: Total Assistance - Patient < 25% Transfers Sit to Stand: Maximal Assistance - Patient 25-49% Stand to Sit: Maximal Assistance - Patient 25-49%  Discharge Criteria: Patient will be discharged from OT if patient refuses treatment 3 consecutive times without medical reason, if treatment goals not met, if there is a change in medical status, if patient makes no progress towards  goals or if patient is discharged from hospital.  The above assessment, treatment plan, treatment alternatives and goals were discussed and mutually agreed upon: by patient and by family  Valma Cava 12/22/2020, 4:09 PM

## 2020-12-22 NOTE — Plan of Care (Signed)
  Problem: RH Eating Goal: LTG Patient will perform eating w/assist, cues/equip (OT) Description: LTG: Patient will perform eating with assist, with/without cues using equipment (OT) Flowsheets (Taken 12/22/2020 1534) LTG: Pt will perform eating with assistance level of: Minimal Assistance - Patient > 75%

## 2020-12-22 NOTE — IPOC Note (Signed)
Overall Plan of Care [facility]) Patient Details Name: Beverly Li MRN: [id number] DOB: 1945-12-12  Admitting Diagnosis: Left middle cerebral artery stroke [facility])  Hospital Problems: Principal Problem:   Left middle cerebral artery stroke [facility])     Functional Problem List: Nursing Bladder, Bowel, Endurance, Medication Management, Safety, Nutrition  PT Balance, Endurance, Motor, Perception, Safety, Sensory, Skin Integrity, Pain  OT Balance, Behavior, Cognition, Motor, Edema, Endurance, Nutrition, Pain, Perception, Safety, Sensory, Skin Integrity, Vision  SLP Cognition, Linguistic, Nutrition  TR         Basic ADL's: OT Eating, Grooming, Bathing, Dressing, Toileting     Advanced  ADL's: OT       Transfers: PT Bed Mobility, Bed to Chair, Car, Occupational psychologist, Research scientist (life sciences): PT Ambulation, Psychologist, prison and probation services, Stairs     Additional Impairments: OT Fuctional Use of Upper Extremity  SLP Swallowing, Communication, Social Cognition comprehension, expression Attention, Awareness, Problem Solving  TR      Anticipated Outcomes Item Anticipated Outcome  Self Feeding Min A  Swallowing  Supervision   Basic self-care  Min A  Toileting  Min A   Bathroom Transfers Min A  Bowel/Bladder  manage bowel and bladder with min assist  Transfers  MinA  Locomotion  MinA  Communication  Mod A  Cognition  Min A  Pain  n/a  Safety/Judgment  maintain safety with cues/reminders   Therapy Plan: PT Intensity: Minimum of 1-2 x/day ,45 to 90 minutes PT Frequency: 5 out of 7 days PT Duration Estimated Length of Stay: 4 weeks OT Intensity: Minimum of 1-2 x/day, 45 to 90 minutes OT Frequency: 5 out of 7 days OT Duration/Estimated Length of Stay: 4 weeks SLP Intensity: Minumum of 1-2 x/day, 30 to 90 minutes SLP Frequency: 3 to 5 out of 7 days   Due to the current state of emergency, patients may not be receiving their 3-hours of Medicare-mandated therapy.   Team  Interventions: Nursing Interventions Bladder Management, Disease Management/Prevention, Medication Management, Discharge Planning, Bowel Management, Patient/Family Education, Dysphagia/Aspiration Precaution Training  PT interventions Ambulation/gait training, Community reintegration, DME/adaptive equipment instruction, Neuromuscular re-education, Psychosocial support, Stair training, UE/LE Strength taining/ROM, Wheelchair propulsion/positioning, Warden/ranger, Discharge planning, Functional electrical stimulation, Pain management, Skin care/wound management, Therapeutic Activities, UE/LE Coordination activities, Cognitive remediation/compensation, Disease management/prevention, Functional mobility training, Patient/family education, Splinting/orthotics, Therapeutic Exercise, Visual/perceptual remediation/compensation  OT Interventions Warden/ranger, Cognitive remediation/compensation, Community reintegration, Discharge planning, Disease mangement/prevention, DME/adaptive equipment instruction, Functional electrical stimulation, Functional mobility training, Neuromuscular re-education, Pain management, Patient/family education, Psychosocial support, Self Care/advanced ADL retraining, Skin care/wound managment, Splinting/orthotics, Therapeutic Activities, Therapeutic Exercise, UE/LE Strength taining/ROM, UE/LE Coordination activities, Wheelchair propulsion/positioning, Visual/perceptual remediation/compensation  SLP Interventions Cognitive remediation/compensation, Dysphagia/aspiration precaution training, Speech/Language facilitation, Therapeutic Activities, Environmental controls, Cueing hierarchy, Functional tasks, Patient/family education  TR Interventions    SW/CM Interventions Discharge Planning, Psychosocial Support, Patient/Family Education   Barriers to Discharge MD  Medical stability  Nursing Decreased caregiver support, Home environment access/layout, Nutrition means,  Incontin[facility] admit [facility]; lived w spouse 2 level 1ste, no rails, main B+B before CVA 8/22  PT Home environment access/layout    OT      SLP Decreased caregiver support, New oxygen    SW Decreased caregiver support, Lack of/limited family support     Team Discharge Planning: Destination: PT-Home ,OT- Home , SLP-Home Projected Follow-up: PT-Home health PT, 24 hour supervision/assistance, OT-  Home health OT, SLP-24 hour supervision/assistance, Home Health SLP Projected Equipment Needs: PT-To be determined,  OT- To be determined, SLP-To be determined Equipment Details: PT- , OT-  Patient/family involved in discharge planning: PT- Patient, Family member/caregiver,  OT-Patient, Family member/caregiver, SLP-Family member/caregiver, Patient  MD ELOS: 21-27 days Medical Rehab Prognosis:  Excellent Assessment: The patient has been admitted for CIR therapies with the diagnosis of left mca infarct. The team will be addressing functional mobility, strength, stamina, balance, safety, adaptive techniques and equipment, self-care, bowel and bladder mgt, patient and caregiver education, NMR, language, cognition, swallowing, communication, commuity reentry. Goals have been set at min to mod assist with mobility, self-care, cognition, communication, swallowing.   Due to the current state of emergency, patients may not be receiving their 3 hours per day of Medicare-mandated therapy.    Ranelle Oyster, MD, FAAPMR     See Team Conference Notes for weekly updates to the plan of care

## 2020-12-22 NOTE — Progress Notes (Signed)
Inpatient Rehabilitation  Patient information reviewed and entered into eRehab system by Tilla Wilborn M. Cambria Osten, M.A., CCC/SLP, PPS Coordinator.  Information including medical coding, functional ability and quality indicators will be reviewed and updated through discharge.    

## 2020-12-22 NOTE — Discharge Instructions (Addendum)
Inpatient Rehab Discharge Instructions  Beverly Li Discharge date and time: No discharge date for patient encounter.   Activities/Precautions/ Functional Status: Activity: activity as tolerated Diet:  Wound Care: Routine skin checks Functional status:  ___ No restrictions     ___ Walk up steps independently ___ 24/7 supervision/assistance   ___ Walk up steps with assistance ___ Intermittent supervision/assistance  ___ Bathe/dress independently ___ Walk with walker     _x__ Bathe/dress with assistance ___ Walk Independently    ___ Shower independently ___ Walk with assistance    ___ Shower with assistance ___ No alcohol     ___ Return to work/school ________  Special Instructions: No driving smoking or alcohol   My questions have been answered and I understand these instructions. I will adhere to these goals and the provided educational materials after my discharge from the hospital.  Patient/Caregiver Signature _______________________________ Date __________  Clinician Signature _______________________________________ Date __________  Please bring this form and your medication list with you to all your follow-up doctor's appointments.  STROKE/TIA DISCHARGE INSTRUCTIONS SMOKING Cigarette smoking nearly doubles your risk of having a stroke & is the single most alterable risk factor  If you smoke or have smoked in the last 12 months, you are advised to quit smoking for your health. Most of the excess cardiovascular risk related to smoking disappears within a year of stopping. Ask you doctor about anti-smoking medications [place] Quit Line: 1-800-QUIT NOW Free Smoking Cessation Classes [phone]  CHOLESTEROL Know your levels; limit fat & cholesterol in your diet  Lipid Panel     Component Value Date/Time   CHOL 209 (H) 11/13/2020 1930   CHOL 211 (H) 09/16/2016 1200   TRIG 97 11/13/2020 1930   HDL 70 11/13/2020 1930   HDL 97 09/16/2016 1200   CHOLHDL 3.0 11/13/2020 1930    VLDL 19 11/13/2020 1930   LDLCALC 120 (H) 11/13/2020 1930   LDLCALC 98 09/16/2016 1200     Many patients benefit from treatment even if their cholesterol is at goal. Goal: Total Cholesterol (CHOL) less than 160 Goal:  Triglycerides (TRIG) less than 150 Goal:  HDL greater than 40 Goal:  LDL (LDLCALC) less than 100   BLOOD PRESSURE American Stroke Association blood pressure target is less that 120/80 mm/Hg  Your discharge blood pressure is:  BP: (!) 153/100 Monitor your blood pressure Limit your salt and alcohol intake Many individuals will require more than one medication for high blood pressure  DIABETES (A1c is a blood sugar average for last 3 months) Goal HGBA1c is under 7% (HBGA1c is blood sugar average for last 3 months)  Diabetes:   Lab Results  Component Value Date   HGBA1C 6.0 (H) 11/13/2020    Your HGBA1c can be lowered with medications, healthy diet, and exercise. Check your blood sugar as directed by your physician Call your physician if you experience unexplained or low blood sugars.  PHYSICAL ACTIVITY/REHABILITATION Goal is 30 minutes at least 4 days per week  Activity: Increase activity slowly, Therapies: Physical Therapy: Home Health Return to work:  Activity decreases your risk of heart attack and stroke and makes your heart stronger.  It helps control your weight and blood pressure; helps you relax and can improve your mood. Participate in a regular exercise program. Talk with your doctor about the best form of exercise for you (dancing, walking, swimming, cycling).  DIET/WEIGHT Goal is to maintain a healthy weight  Your discharge diet is:  Diet Order  Diet NPO time specified  Diet effective now                   liquids Your height is:  Height: 5\' 3"  (160 cm) Your current weight is: Weight: 49.8 kg Your Body Mass Index (BMI) is:  BMI (Calculated): 19.45 Following the type of diet specifically designed for you will help prevent another  stroke. Your goal weight range is:   Your goal Body Mass Index (BMI) is 19-24. Healthy food habits can help reduce 3 risk factors for stroke:  High cholesterol, hypertension, and excess weight.  RESOURCES Stroke/Support Group:  Call [phone]   STROKE EDUCATION PROVIDED/REVIEWED AND GIVEN TO PATIENT Stroke warning signs and symptoms How to activate emergency medical system (call 911). Medications prescribed at discharge. Need for follow-up after discharge. Personal risk factors for stroke. Pneumonia vaccine given: No Flu vaccine given: No My questions have been answered, the writing is legible, and I understand these instructions.  I will adhere to these goals & educational materials that have been provided to me after my discharge from the hospital.

## 2020-12-22 NOTE — Plan of Care (Signed)
Problem: RH Balance Goal: LTG: Patient will maintain dynamic sitting balance (OT) Description: LTG:  Patient will maintain dynamic sitting balance with assistance during activities of daily living (OT) Flowsheets (Taken 12/22/2020 1318) LTG: Pt will maintain dynamic sitting balance during ADLs with: Contact Guard/Touching assist Goal: LTG Patient will maintain dynamic standing with ADLs (OT) Description: LTG:  Patient will maintain dynamic standing balance with assist during activities of daily living (OT)  Flowsheets (Taken 12/22/2020 1318) LTG: Pt will maintain dynamic standing balance during ADLs with: Minimal Assistance - Patient > 75%   Problem: Sit to Stand Goal: LTG:  Patient will perform sit to stand in prep for activites of daily living with assistance level (OT) Description: LTG:  Patient will perform sit to stand in prep for activites of daily living with assistance level (OT) Flowsheets (Taken 12/22/2020 1318) LTG: PT will perform sit to stand in prep for activites of daily living with assistance level: Minimal Assistance - Patient > 75%   Problem: RH Grooming Goal: LTG Patient will perform grooming w/assist,cues/equip (OT) Description: LTG: Patient will perform grooming with assist, with/without cues using equipment (OT) Flowsheets (Taken 12/22/2020 1318) LTG: Pt will perform grooming with assistance level of: Contact Guard/Touching assist   Problem: RH Bathing Goal: LTG Patient will bathe all body parts with assist levels (OT) Description: LTG: Patient will bathe all body parts with assist levels (OT) Flowsheets (Taken 12/22/2020 1318) LTG: Pt will perform bathing with assistance level/cueing: Minimal Assistance - Patient > 75%   Problem: RH Dressing Goal: LTG Patient will perform upper body dressing (OT) Description: LTG Patient will perform upper body dressing with assist, with/without cues (OT). Flowsheets (Taken 12/22/2020 1318) LTG: Pt will perform upper body dressing with  assistance level of: Minimal Assistance - Patient > 75% Goal: LTG Patient will perform lower body dressing w/assist (OT) Description: LTG: Patient will perform lower body dressing with assist, with/without cues in positioning using equipment (OT) Flowsheets (Taken 12/22/2020 1318) LTG: Pt will perform lower body dressing with assistance level of: Minimal Assistance - Patient > 75%   Problem: RH Toileting Goal: LTG Patient will perform toileting task (3/3 steps) with assistance level (OT) Description: LTG: Patient will perform toileting task (3/3 steps) with assistance level (OT)  Flowsheets (Taken 12/22/2020 1318) LTG: Pt will perform toileting task (3/3 steps) with assistance level: Minimal Assistance - Patient > 75%   Problem: RH Functional Use of Upper Extremity Goal: LTG Patient will use RT/LT upper extremity as a (OT) Description: LTG: Patient will use right/left upper extremity as a stabilizer/gross assist/diminished/nondominant/dominant level with assist, with/without cues during functional activity (OT) Flowsheets (Taken 12/22/2020 1318) LTG: Use of upper extremity in functional activities: RUE as a stabilizer   Problem: RH Toilet Transfers Goal: LTG Patient will perform toilet transfers w/assist (OT) Description: LTG: Patient will perform toilet transfers with assist, with/without cues using equipment (OT) Flowsheets (Taken 12/22/2020 1318) LTG: Pt will perform toilet transfers with assistance level of: Minimal Assistance - Patient > 75%   Problem: RH Tub/Shower Transfers Goal: LTG Patient will perform tub/shower transfers w/assist (OT) Description: LTG: Patient will perform tub/shower transfers with assist, with/without cues using equipment (OT) Flowsheets (Taken 12/22/2020 1318) LTG: Pt will perform tub/shower stall transfers with assistance level of: Minimal Assistance - Patient > 75%   Problem: RH Attention Goal: LTG Patient will demonstrate this level of attention during functional  activites (OT) Description: LTG:  Patient will demonstrate this level of attention during functional activites  (OT) Flowsheets (Taken 12/22/2020 1318)  LTG: Patient will demonstrate this level of attention during functional activites (OT): Minimal Assistance - Patient > 75%   Problem: RH Awareness Goal: LTG: Patient will demonstrate awareness during functional activites type of (OT) Description: LTG: Patient will demonstrate awareness during functional activites type of (OT) Flowsheets (Taken 12/22/2020 1318) LTG: Patient will demonstrate awareness during functional activites type of (OT): Minimal Assistance - Patient > 75%

## 2020-12-22 NOTE — Evaluation (Signed)
Physical Therapy Assessment and Plan  Patient Details  Name: Beverly Li MRN: [id number] Date of Birth: December 28, 1945  PT Diagnosis: Abnormality of gait, Difficulty walking, Hemiplegia dominant, and Muscle weakness Rehab Potential: Good ELOS: 4 weeks   Today's Date: 12/22/2020 PT Individual Time: 0240-9735 and 1503-1530 PT Individual Time Calculation (min): 62 min and 27 min   Hospital Problem: Principal Problem:   Left middle cerebral artery stroke [facility])   Past Medical History:  Past Medical History:  Diagnosis Date   Anxiety    Arthritis    "some in my lower back; probably elbows, knees" (11/18/2017)   Atrial fibrillation (HCC)    Bell's palsy    when pt. was 75 yrs old, when under stress the left side of face will droop.   Complication of anesthesia    "vascular OR 2016; BP bottomed out; couldn't get it regulated; ended up in ICU for DAYS" (11/18/2017)   GERD (gastroesophageal reflux disease)    History of kidney stones    Hypertension    Hypertrophic cardiomyopathy (HCC)    severe LV basilar hypertrophy witn no evidence of significant outflow tract obstruction, EF 65-70%, mild LAE, mild TR, grade 1a diastolic dysfunction 07/12/90 (Dr. Candee Furbish) (Atrial Septal Hypertrophy pattern)-- Intra-op TEE with dsignificant outflow tract obstruction - AI, MR & TR   Insomnia    Mild aortic sclerosis    Osteopenia    Peripheral vascular disease (HCC)    Syncope    , Vagal   Past Surgical History:  Past Surgical History:  Procedure Laterality Date   AUGMENTATION MAMMAPLASTY Bilateral    BACK SURGERY     CARDIAC CATHETERIZATION N/A 05/07/2016   Procedure: Left Heart Cath and Coronary Angiography;  Surgeon: Burnell Blanks, MD;  Location: [place] CV LAB;  Service: Cardiovascular;  Laterality: N/A;   CARDIOVERSION N/A 09/24/2017   Procedure: CARDIOVERSION;  Surgeon: Larey Dresser, MD;  Location: [facility] ENDOSCOPY;  Service: Cardiovascular;  Laterality: N/A;   DILATION AND  CURETTAGE OF UTERUS     ENDARTERECTOMY FEMORAL Right 03/02/2015   Procedure: ENDARTERECTOMY RIGHT FEMORAL;  Surgeon: Angelia Mould, MD;  Location: [place];  Service: Vascular;  Laterality: Right;   ESOPHAGOGASTRODUODENOSCOPY (EGD) WITH PROPOFOL N/A 12/01/2020   Procedure: ESOPHAGOGASTRODUODENOSCOPY (EGD) WITH PROPOFOL;  Surgeon: Jesusita Oka, MD;  Location: [place] ENDOSCOPY;  Service: General;  Laterality: N/A;   FACIAL COSMETIC SURGERY Left 2002   "related to [place] @ age 36; left eye/side of face droopy; tried to make area symmetrical"   FEMORAL-POPLITEAL BYPASS GRAFT Right 03/02/2015   Procedure: BYPASS GRAFT FEMORAL-BELOW KNEE POPLITEAL ARTERY;  Surgeon: Angelia Mould, MD;  Location: [place];  Service: Vascular;  Laterality: Right;   INGUINAL HERNIA REPAIR Bilateral 2002   IR ANGIO INTRA EXTRACRAN SEL COM CAROTID INNOMINATE UNI L MOD SED  11/16/2020   IR CT HEAD LTD  11/13/2020   IR PERCUTANEOUS ART THROMBECTOMY/INFUSION INTRACRANIAL INC DIAG ANGIO  11/13/2020   OVARIAN CYST REMOVAL Left    PEG PLACEMENT N/A 12/01/2020   Procedure: PERCUTANEOUS ENDOSCOPIC GASTROSTOMY (PEG) PLACEMENT;  Surgeon: Jesusita Oka, MD;  Location: [place];  Service: General;  Laterality: N/A;   PERIPHERAL VASCULAR CATHETERIZATION N/A 01/16/2015   Procedure: Abdominal Aortogram;  Surgeon: Angelia Mould, MD;  Location: [place] [facility] LAB;  Service: Cardiovascular;  Laterality: N/A;   POSTERIOR LUMBAR FUSION  2015   "have plates and screws in there"   [place] N/A 11/13/2020   Procedure:  IR WITH ANESTHESIA;  Surgeon: Radiologist, Medication, MD;  Location: [place];  Service: Radiology;  Laterality: N/A;   TONSILLECTOMY      Assessment & Plan Clinical Impression: Patient is a 75 year old right-handed female with history of chronic diastolic congestive heart failure, atrial fibrillation maintained on Eliquis, Bell's palsy, hypertension, OSA, prediabetes, right femoral-popliteal  bypass graft stenosis followed by vascular surgery, recent hospitalization 8/1-8/24/2022 for 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  She was discharged to skilled nursing facility.  Per chart review prior to CVA August 2022 patient lives with spouse two-level home bed and bath main level.  She was independent without DME.  Husband planning discharged to home with hired caregivers.  Presented 12/12/2020 with labored breathing as well as low-grade fever.  Noted admission chemistries WBC 19,000 BUN 34 troponin high-sensitivity 173.  Chest x-ray noted increased right pleural effusion with worsening opacity in the right lung base with unchanged left pleural effusion.  Placed on broad-spectrum antibiotics.  Blood cultures remain negative.  She continued on Eliquis.  N.p.o. with PEG tube feeds ongoing as prior to admission.  Antibiotic therapy has been completed.  Patient transferred to CIR on 12/21/2020 .   Patient currently requires max with mobility secondary to muscle weakness, decreased cardiorespiratoy endurance and decreased oxygen support, unbalanced muscle activation, decreased coordination, and decreased motor planning, decreased attention to right, and decreased sitting balance, decreased standing balance, decreased postural control, hemiplegia, and decreased balance strategies.  Prior to hospitalization, patient was independent  with mobility and lived with Spouse in a House home.  Home access is 2Stairs to enter.  Patient will benefit from skilled PT intervention to maximize safe functional mobility, minimize fall risk, and decrease caregiver burden for planned discharge home with 24 hour assist.  Anticipate patient will benefit from follow up [facility] at discharge.  PT - End of Session Activity Tolerance: Tolerates 10 - 20 min activity with multiple rests Endurance Deficit: Yes Endurance Deficit  Description: required rest breaks during mobility tasks PT Assessment Rehab Potential (ACUTE/IP ONLY): Good PT Barriers to Discharge: Home environment access/layout PT Patient demonstrates impairments in the following area(s): Balance;Endurance;Motor;Perception;Safety;Sensory;Skin Integrity;Pain PT Transfers Functional Problem(s): Bed Mobility;Bed to Chair;Car;Furniture PT Locomotion Functional Problem(s): Ambulation;Wheelchair Mobility;Stairs PT Plan PT Intensity: Minimum of 1-2 x/day ,45 to 90 minutes PT Frequency: 5 out of 7 days PT Duration Estimated Length of Stay: 4 weeks PT Treatment/Interventions: Ambulation/gait training;Community reintegration;DME/adaptive equipment instruction;Neuromuscular re-education;Psychosocial support;Stair training;UE/LE Strength taining/ROM;Wheelchair propulsion/positioning;Balance/vestibular training;Discharge planning;Functional electrical stimulation;Pain management;Skin care/wound management;Therapeutic Activities;UE/LE Coordination activities;Cognitive remediation/compensation;Disease management/prevention;Functional mobility training;Patient/family education;Splinting/orthotics;Therapeutic Exercise;Visual/perceptual remediation/compensation PT Transfers Anticipated Outcome(s): MinA PT Locomotion Anticipated Outcome(s): MinA PT Recommendation Follow Up Recommendations: Home health PT;24 hour supervision/assistance Patient destination: Home Equipment Recommended: To be determined   PT Evaluation Precautions/Restrictions Precautions Precautions: Fall;Other (comment) Precaution Comments: R hemiplegia with R shoulder subluxation, PEG, adominal binder; bladder/bowel incontinence Restrictions Weight Bearing Restrictions: No General Chart Reviewed: Yes Family/Caregiver Present: Yes Vital SignsTherapy Vitals Temp: 97.6 F (36.4 C) Temp Source: Axillary Pulse Rate: 74 Resp: 17 BP: 132/83 Patient Position (if appropriate): Lying Oxygen  Therapy SpO2: 97 % O2 Device: Nasal Cannula O2 Flow Rate (L/min): 2 L/min Pain Pain Assessment Pain Scale: Faces Pain Score: 0-No pain Faces Pain Scale: No hurt PAINAD (Pain Assessment in Advanced Dementia) Breathing: normal Negative Vocalization: none Facial Expression: smiling or inexpressive Body Language: relaxed Consolability: no need to console PAINAD Score: 0 Pain Interference Pain Interference Pain  Effect on Sleep: 1. Rarely or not at all Pain Interference with Therapy Activities: 1. Rarely or not at all Pain Interference with Day-to-Day Activities: 1. Rarely or not at all Home Living/Prior [place] Available Help at Discharge: Family;Available 24 hours/day Type of Home: House Home Access: Stairs to enter [employer] of Steps: 2 Entrance Stairs-Rails: None Home Layout: Two level;Able to live on main level with bedroom/bathroom Additional Comments: Recent admission for stroke with d/c to [facility] 12/06/20. Husband reports unsure if pt to return home, or if able to come home with hired caregivers  Lives With: Spouse Prior Function Level of Independence: Independent with basic ADLs;Independent with transfers;Independent with homemaking with ambulation;Independent with gait  Able to Take Stairs?: Yes Driving: Yes Comments: Pt was able to perform all ADLs, mobility, and manage finances/meds without assistance. Vision/Perception  Perception Perception: Impaired Praxis Praxis: Impaired Praxis Impairment Details: Initiation  Cognition Overall Cognitive Status: Difficult to assess Arousal/Alertness: Awake/alert Orientation Level: Oriented to person Attention: Sustained Focused Attention: Appears intact Sustained Attention: Impaired Sustained Attention Impairment: Functional basic Awareness: Impaired Awareness Impairment: Emergent impairment Safety/Judgment: Impaired Sensation Sensation Light Touch: Appears Intact Additional  Comments: Pt provides infrequent and inconsistent responses, but does react to deep pressure. Coordination Gross Motor Movements are Fluid and Coordinated: No Fine Motor Movements are Fluid and Coordinated: No Motor  Motor Motor: Hemiplegia   Trunk/Postural Assessment  Cervical Assessment Cervical Assessment:  (forward head posture) Thoracic Assessment Thoracic Assessment:  (rounded shoulders) Lumbar Assessment Lumbar Assessment:  (posterior pelvic tilt) Postural Control Postural Control: Deficits on evaluation Righting Reactions: delayed and inadequate Protective Responses: delayed and inadequate  Balance Balance Balance Assessed: Yes Static Sitting Balance Static Sitting - Balance Support: Feet supported Static Sitting - Level of Assistance: 4: Min assist;3: Mod assist Dynamic Sitting Balance Dynamic Sitting - Balance Support: Feet supported Dynamic Sitting - Level of Assistance: 2: Max assist Static Standing Balance Static Standing - Balance Support: During functional activity Static Standing - Level of Assistance: 2: Max assist Dynamic Standing Balance Dynamic Standing - Balance Support: During functional activity Dynamic Standing - Level of Assistance: 1: +1 Total assist Extremity Assessment      RLE Assessment RLE Assessment: Exceptions to [facility] General Strength Comments: No voluntary activation noted LLE Assessment General Strength Comments: Grossly 4/5  Care Tool Care Tool Bed Mobility Roll left and right activity   Roll left and right assist level: Maximal Assistance - Patient 25 - 49%    Sit to lying activity   Sit to lying assist level: Total Assistance - Patient < 25%    Lying to sitting on side of bed activity   Lying to sitting on side of bed assist level: the ability to move from lying on the back to sitting on the side of the bed with no back support.: Total Assistance - Patient < 25%     Care Tool Transfers Sit to stand transfer   Sit to stand  assist level: Maximal Assistance - Patient 25 - 49%    Chair/bed transfer   Chair/bed transfer assist level: Maximal Assistance - Patient 25 - 49%     Toilet transfer   Assist Level: Maximal Assistance - Patient 24 - 49%    Car transfer   Car transfer assist level: Total Assistance - Patient < 25%      Care Tool Locomotion Ambulation   Assist level: 2 helpers Assistive device: Hand held assist (L hall rail) Max distance: 20'  Walk 10 feet  activity   Assist level: 2 helpers Assistive device: Hand held assist (L hall rail)   Walk 50 feet with 2 turns activity Walk 50 feet with 2 turns activity did not occur: Safety/medical concerns      Walk 150 feet activity Walk 150 feet activity did not occur: Safety/medical concerns      Walk 10 feet on uneven surfaces activity Walk 10 feet on uneven surfaces activity did not occur: Safety/medical concerns      Stairs Stair activity did not occur: Safety/medical concerns        Walk up/down 1 step activity Walk up/down 1 step or curb (drop down) activity did not occur: Safety/medical concerns     Walk up/down 4 steps activity did not occuR: Safety/medical concerns  Walk up/down 4 steps activity      Walk up/down 12 steps activity Walk up/down 12 steps activity did not occur: Safety/medical concerns      Pick up small objects from floor Pick up small object from the floor (from standing position) activity did not occur: Safety/medical concerns      Wheelchair Is the patient using a wheelchair?: Yes Type of Wheelchair: Manual   Wheelchair assist level: Dependent - Patient 0% Max wheelchair distance: 150'  Wheel 50 feet with 2 turns activity   Assist Level: Dependent - Patient 0%  Wheel 150 feet activity   Assist Level: Dependent - Patient 0%    Refer to Care Plan for Long Term Goals  SHORT TERM GOAL WEEK 1 PT Short Term Goal 1 (Week 1): Pt will complete bed mobility with modA. PT Short Term Goal 2 (Week 1): Pt will  complete sit to stand transfer with modA +1. PT Short Term Goal 3 (Week 1): Pt will complete bed to chair transfer with modA +1. PT Short Term Goal 4 (Week 1): Pt will ambulate x50' with modA +2.  Recommendations for other services: None   Skilled Therapeutic Intervention  1st Session: Evaluation completed (see details above and below) with education on PT POC and goals and individual treatment initiated with focus on bed mobility, balance, transfers, and ambulation. Pt received supine and gives "ok" sign to therapy. Therapist asks about pain and pt responds "No". Pt completes supine to sit with cues for sequencing and totalA to complete. Sit to stand with maxA and cues for body  mechanics, and maxA for stand step transfer to [facility] with PT blocking R knee and providing cues for positioning. WC transport to gym for time management. Pt completes stand pivot transfer to/from car with totalA and cues for foot placement. Following extended seated rest break, pt ambulates x20' in hallway using L hand rail and PT providing maxA on R, managing R lower extremity, blocking knee during stance phase, and providing assistance for weight shift to the R with cues for swing through gait pattern on L. Pt has x1 instance of total knee buckling, requiring totalA to prevent fall. +2 for WC follow for safety. Pt then transfer to mat table to the R with maxA and tactile cues through R femur to encourage WB. Mirror positioned fo visual feedback and pt works on achieving static sitting balance with midline orientation and neutral pelvic tilt with use of theraball positioned behind pt. Pt performs squat pivot from mat>WC>bed with maxA and tactile cues for body mechanics. Sit to supine with totalA. Pt left supine with alarm intact and all needs within reach.  2nd Session: Pt received supine in bed and agrees to therapy. No  complaint of pain. Supine to sit with cues for logrolling and maxA/totalA for sidelying to sit. Stand pivot to [facility]  with maxA and cues for foot placement and positioning for safety. WC transport to gym for time management. Pt performs squat pivot to mat table with maxA and tactile cues for body mechanics and anterior trunk lean for optimal head hips relationship. Pt performs sit to stand form edge of mat with maxA and works on standing balance, with PT facilitating lateral weight shifting and blocking R knee for safety. No activation of R lower extremity muscle groups noted. Pt performs squat pivot from mat>WC>bed with maxA and tactile cues for body mechanics. Sit to supine with totalA. Pt left supine with alarm intact and all needs within reach.  Mobility Bed Mobility Bed Mobility: Supine to Sit;Sit to Supine Supine to Sit: Total Assistance - Patient < 25% Sit to Supine: Total Assistance - Patient < 25% Transfers Transfers: Sit to Stand;Stand to Sit;Stand Pivot Transfers;Squat Pivot Transfers Sit to Stand: Maximal Assistance - Patient 25-49% Stand to Sit: Maximal Assistance - Patient 25-49% Stand Pivot Transfers: Maximal Assistance - Patient 25 - 49% Stand Pivot Transfer Details: Verbal cues for sequencing;Verbal cues for technique;Verbal cues for precautions/safety;Tactile cues for weight shifting;Tactile cues for sequencing;Tactile cues for initiation;Tactile cues for posture Squat Pivot Transfers: Maximal Assistance - Patient 25-49% Transfer (Assistive device): 1 person hand held assist Locomotion  Gait Ambulation: Yes Gait Assistance: 2 Helpers Gait Distance (Feet): 20 Feet Assistive device: 1 person hand held assist (L hand rail) Gait Assistance Details: Verbal cues for sequencing;Verbal cues for gait pattern;Verbal cues for technique;Tactile cues for weight shifting;Tactile cues for weight beaing;Tactile cues for placement;Tactile cues for sequencing Gait Gait: Yes Gait Pattern: Impaired (R hemi gait) Gait velocity: decreased Stairs / Additional Locomotion Stairs: No Wheelchair  Mobility Wheelchair Mobility: No   Discharge Criteria: Patient will be discharged from PT if patient refuses treatment 3 consecutive times without medical reason, if treatment goals not met, if there is a change in medical status, if patient makes no progress towards goals or if patient is discharged from hospital.  The above assessment, treatment plan, treatment alternatives and goals were discussed and mutually agreed upon: by patient and by family  Breck Coons, PT, DPt 12/22/2020, 3:51 PM

## 2020-12-22 NOTE — Evaluation (Signed)
Speech Language Pathology Assessment and Plan  Patient Details  Name: Beverly Li MRN: [id number] Date of Birth: 09/06/1945  SLP Diagnosis: Aphasia;Dysphagia;Apraxia;Cognitive Impairments  Rehab Potential: Good ELOS: 4 weeks   Today's Date: 12/22/2020 SLP Individual Time: 6606-3016 SLP Individual Time Calculation (min): 60 min   Hospital Problem: Principal Problem:   Left middle cerebral artery stroke [facility])  Past Medical History:  Past Medical History:  Diagnosis Date   Anxiety    Arthritis    "some in my lower back; probably elbows, knees" (11/18/2017)   Atrial fibrillation (HCC)    Bell's palsy    when pt. was 75 yrs old, when under stress the left side of face will droop.   Complication of anesthesia    "vascular OR 2016; BP bottomed out; couldn't get it regulated; ended up in ICU for DAYS" (11/18/2017)   GERD (gastroesophageal reflux disease)    History of kidney stones    Hypertension    Hypertrophic cardiomyopathy (HCC)    severe LV basilar hypertrophy witn no evidence of significant outflow tract obstruction, EF 65-70%, mild LAE, mild TR, grade 1a diastolic dysfunction 0/10/93 (Dr. Candee Furbish) (Atrial Septal Hypertrophy pattern)-- Intra-op TEE with dsignificant outflow tract obstruction - AI, MR & TR   Insomnia    Mild aortic sclerosis    Osteopenia    Peripheral vascular disease (HCC)    Syncope    , Vagal   Past Surgical History:  Past Surgical History:  Procedure Laterality Date   AUGMENTATION MAMMAPLASTY Bilateral    BACK SURGERY     CARDIAC CATHETERIZATION N/A 05/07/2016   Procedure: Left Heart Cath and Coronary Angiography;  Surgeon: Burnell Blanks, MD;  Location: [place] [facility];  Service: Cardiovascular;  Laterality: N/A;   CARDIOVERSION N/A 09/24/2017   Procedure: CARDIOVERSION;  Surgeon: Larey Dresser, MD;  Location: [facility] ENDOSCOPY;  Service: Cardiovascular;  Laterality: N/A;   DILATION AND CURETTAGE OF UTERUS     ENDARTERECTOMY FEMORAL  Right 03/02/2015   Procedure: ENDARTERECTOMY RIGHT FEMORAL;  Surgeon: Angelia Mould, MD;  Location: [place];  Service: Vascular;  Laterality: Right;   ESOPHAGOGASTRODUODENOSCOPY (EGD) WITH PROPOFOL N/A 12/01/2020   Procedure: ESOPHAGOGASTRODUODENOSCOPY (EGD) WITH PROPOFOL;  Surgeon: Jesusita Oka, MD;  Location: [place] ENDOSCOPY;  Service: General;  Laterality: N/A;   FACIAL COSMETIC SURGERY Left 2002   "related to [place] @ age 42; left eye/side of face droopy; tried to make area symmetrical"   FEMORAL-POPLITEAL BYPASS GRAFT Right 03/02/2015   Procedure: BYPASS GRAFT FEMORAL-BELOW KNEE POPLITEAL ARTERY;  Surgeon: Angelia Mould, MD;  Location: [place];  Service: Vascular;  Laterality: Right;   INGUINAL HERNIA REPAIR Bilateral 2002   IR ANGIO INTRA EXTRACRAN SEL COM CAROTID INNOMINATE UNI L MOD SED  11/16/2020   IR CT HEAD LTD  11/13/2020   IR PERCUTANEOUS ART THROMBECTOMY/INFUSION INTRACRANIAL INC DIAG ANGIO  11/13/2020   OVARIAN CYST REMOVAL Left    PEG PLACEMENT N/A 12/01/2020   Procedure: PERCUTANEOUS ENDOSCOPIC GASTROSTOMY (PEG) PLACEMENT;  Surgeon: Jesusita Oka, MD;  Location: [place];  Service: General;  Laterality: N/A;   PERIPHERAL VASCULAR CATHETERIZATION N/A 01/16/2015   Procedure: Abdominal Aortogram;  Surgeon: Angelia Mould, MD;  Location: [place] [facility];  Service: Cardiovascular;  Laterality: N/A;   POSTERIOR LUMBAR FUSION  2015   "have plates and screws in there"   [place] N/A 11/13/2020   Procedure: IR WITH ANESTHESIA;  Surgeon: Radiologist, Medication, MD;  Location: [place];  Service: Radiology;  Laterality: N/A;   TONSILLECTOMY      Assessment / Plan / Recommendation Clinical Impression Patient is a 75 year old right-handed female with history of chronic diastolic congestive heart failure, atrial fibrillation maintained on Eliquis, Bell's palsy, hypertension, OSA, prediabetes, right femoral-popliteal bypass graft stenosis followed  by vascular surgery, recent hospitalization 8/1-8/24/2022 for left MCA infarction status post thrombectomy with residual right-sided weakness as well as dysphagia and PEG tube placed 12/01/2020.  She was discharged to skilled nursing facility.  Presented 12/12/2020 with labored breathing as well as low-grade fever.  Chest x-ray noted increased right pleural effusion with worsening opacity in the right lung base with unchanged left pleural effusion.  Placed on broad-spectrum antibiotics.    N.p.o. with PEG tube feeds ongoing as prior to admission.  Antibiotic therapy has been completed.  Therapy evaluations completed due to patient decreased functional ability with recent history of CVA she was recommended for  a comprehensive rehab program. Patient admitted 12/21/20.  Upon arrival, patient was awake in bed with family present. SLP performed through oral care via the suction toothbrush prior to PO intake and educated family on importance of oral hygiene.  With initial contextual and tactile cues, patient consumed small sips of thin liquids via cup. Patient demonstrated what appeared to be mildly prolonged oral manipulation but initiated a consistent swallow response, although it appeared somewhat inconsistent in timing. No overt s/s of aspiration noted. Patient also observed with lunch meal of Dys. 1 textures with nectar-thick liquids. Patient needed consistent verbal and tactile cues for initiation of self-feeding throughout meal. Moderate verbal cues needed to attend to right anterior spillage. Again, mildly prolonged oral manipulation was noted with a consistent swallow response without overt s/s of aspiration. Patient consumed minimal amounts of meal today but initiated attempting to push table out of the way to communicate she was done eating. Based on results of most recent MBS on 9/8 that showed sensed aspiration with large sips of thin liquids, recommend patient continue current diet of Dys. 1 textures with  nectar-thick liqiuds with full supervision to maximize safety with PO intake and allow small sips of thin liquids between meals. Patient demonstrates a severe aphasia with apraxia. Throughout session, patient followed 1-step commands with Mod visual and contextual cues. She consistently vocalized "uh" when attempting to communicate but would ultimately utilize facial expressions. Patient also intermittently utilized head nods. Patient's family reports patient did verbalize "no" earlier in the day.  Patient would benefit from skilled SLP intervention to maximize her swallowing function as well as her functional communication prior to discharge.    Skilled Therapeutic Interventions          Administered a cognitive-linguistic evaluation and BSE, please see above for details.   SLP Assessment  Patient will need skilled Speech Lanaguage Pathology Services during CIR admission    Recommendations  SLP Diet Recommendations: Dysphagia 1 (Puree);Nectar (can have thin liquids between meals) Liquid Administration via: Cup;Spoon Medication Administration: Via alternative means Supervision: Patient able to self feed;Staff to assist with self feeding;Full supervision/cueing for compensatory strategies Compensations: Minimize environmental distractions Postural Changes and/or Swallow Maneuvers: Seated upright 90 degrees Oral Care Recommendations: Oral care BID Patient destination: Home Follow up Recommendations: 24 hour supervision/assistance;Home Health SLP Equipment Recommended: To be determined    SLP Frequency 3 to 5 out of 7 days   SLP Duration  SLP Intensity  SLP Treatment/Interventions  4 weeks  Minumum of 1-2 x/day, 30 to 90 minutes  Cognitive remediation/compensation;Dysphagia/aspiration precaution training;Speech/Language  facilitation;Therapeutic Activities;Environmental controls;Cueing hierarchy;Functional tasks;Patient/family education    Pain Pain Assessment Pain Scale: Faces Pain  Score: 0-No pain Faces Pain Scale: No hurt PAINAD (Pain Assessment in Advanced Dementia) Breathing: normal Negative Vocalization: none Facial Expression: smiling or inexpressive Body Language: relaxed Consolability: no need to console PAINAD Score: 0  Prior Functioning Type of Home: House  Lives With: Spouse Available Help at Discharge: Family;Available 24 hours/day  SLP Evaluation Cognition Overall Cognitive Status: Difficult to assess Arousal/Alertness: Awake/alert Orientation Level: Oriented to person Attention: Sustained Focused Attention: Appears intact Sustained Attention: Impaired Sustained Attention Impairment: Functional basic Awareness: Impaired Awareness Impairment: Emergent impairment Safety/Judgment: Impaired  Comprehension Auditory Comprehension Overall Auditory Comprehension: Impaired Yes/No Questions: Impaired Basic Biographical Questions: 0-25% accurate (minimal and inconsistent responses) Commands: Impaired One Step Basic Commands: 25-49% accurate Conversation: Simple Visual Recognition/Discrimination Discrimination: Not tested Reading Comprehension Reading Status: Not tested Expression Expression Primary Mode of Expression: Verbal Verbal Expression Initiation: Impaired Level of Generative/Spontaneous Verbalization:  (unable) Repetition: Impaired Written Expression Dominant Hand: Right Written Expression: Unable to assess (comment) Oral Motor Oral Motor/Sensory Function Overall Oral Motor/Sensory Function: Moderate impairment Facial ROM: Reduced right;Suspected CN VII (facial) dysfunction Facial Symmetry: Abnormal symmetry right;Suspected CN VII (facial) dysfunction Facial Strength: Reduced right;Suspected CN VII (facial) dysfunction Lingual ROM: Reduced right;Reduced left Lingual Symmetry: Within Functional Limits Motor Speech Overall Motor Speech: Impaired Respiration: Impaired Articulation: Impaired Motor Planning: Impaired Level of  Impairment: Word  Care Tool Care Tool Cognition Ability to hear (with hearing aid or hearing appliances if normally used Ability to hear (with hearing aid or hearing appliances if normally used): 1. Minimal difficulty - difficulty in some environments (e.g. when person speaks softly or setting is noisy)   Expression of Ideas and Wants Expression of Ideas and Wants: 1. Rarely/Never expressess or very difficult - rarely/never expresses self or speech is very difficult to understand   Understanding Verbal and Non-Verbal Content Understanding Verbal and Non-Verbal Content: 2. Sometimes understands - understands only basic conversations or simple, direct phrases. Frequently requires cues to understand  Memory/Recall Ability      Bedside Swallowing Assessment General Date of Onset: 11/13/20 Previous Swallow Assessment: MBS 9/8 Diet Prior to this Study: Dysphagia 1 (puree);Nectar-thick liquids Temperature Spikes Noted: No Respiratory Status: Supplemental O2 delivered via (comment) History of Recent Intubation: No Length of Intubations (days): 2 days Date extubated: 11/14/20 Behavior/Cognition: Alert;Cooperative Oral Cavity - Dentition: Adequate natural dentition Self-Feeding Abilities: Able to feed self;Needs assist Vision: Functional for self-feeding Patient Positioning: Upright in chair/Tumbleform Volitional Cough: Cognitively unable to elicit Volitional Swallow: Unable to elicit  Oral Care Assessment Does patient have any of the following "high(er) risk" factors?: Diet - patient on tube feedings Does patient have any of the following "at risk" factors?: Other - dysphagia;Oxygen therapy - cannula, mask, simple oxygen devices Patient is HIGHER RISK:  Mechanically ventilated (Intubated or Tracheostomy): Order set for Adult Oral Care Protocol initiated - "Higher Risk Patients:  Mechanically Ventilated" option selected (see row information) Thin Liquid Thin Liquid: Impaired Presentation:  Cup;Spoon Oral Phase Impairments: Reduced labial seal;Reduced lingual movement/coordination Oral Phase Functional Implications: Right anterior spillage;Prolonged oral transit Pharyngeal  Phase Impairments: Suspected delayed Swallow Nectar Thick Nectar Thick Liquid: Impaired Presentation: Cup Oral Phase Impairments: Reduced labial seal Oral phase functional implications: Right anterior spillage;Prolonged oral transit Pharyngeal Phase Impairments: Suspected delayed Swallow Honey Thick Honey Thick Liquid: Not tested Puree Puree: Impaired Presentation: Self Fed;Spoon Oral Phase Impairments: Reduced labial seal;Reduced lingual movement/coordination Oral Phase Functional Implications: Prolonged oral transit;Right anterior  spillage Pharyngeal Phase Impairments: Suspected delayed Swallow Solid Solid: Not tested BSE Assessment Risk for Aspiration Impact on safety and function: Mild aspiration risk Other Related Risk Factors: Decreased respiratory status;Deconditioning;Previous CVA;History of dysphagia  Short Term Goals: Week 1: SLP Short Term Goal 1 (Week 1): Patient will consume current diet with minimal overt s/s of aspiration with Min verba and visual cues for use of swallowing compensatory strategies. SLP Short Term Goal 2 (Week 1): Patient will consume thin liquids with minimal overt s/s of aspiration with Min verbal cues for use of small sips via cup. SLP Short Term Goal 3 (Week 1): Patient will answer yes/no questions utilizing multimodal communication with 25% accuracy and Max A multimodal cues. SLP Short Term Goal 4 (Week 1): Patient will vocalize on command in 25% of opportunities with Max A multimodal cues.  Refer to Care Plan for Long Term Goals  Recommendations for other services: None   Discharge Criteria: Patient will be discharged from SLP if patient refuses treatment 3 consecutive times without medical reason, if treatment goals not met, if there is a change in medical  status, if patient makes no progress towards goals or if patient is discharged from hospital.  The above assessment, treatment plan, treatment alternatives and goals were discussed and mutually agreed upon: by family  Claudett Bayly 12/22/2020, 2:06 PM

## 2020-12-22 NOTE — Progress Notes (Signed)
Orthopedic Tech Progress Note Patient Details:  Beverly Li February 02, 1946 [id number] [facility] [facility] [employer] [employer] have been ordered from [facility].  Patient ID: Beverly Li, female   DOB: 06/12/1945, 75 y.o.   MRN: [id number]  Beverly Li 12/22/2020, 4:20 PM

## 2020-12-22 NOTE — Progress Notes (Signed)
PROGRESS NOTE   Subjective/Complaints: Pt had an uneventful night. Had 2 bms. Husband at bedside  ROS: limited due to language/communication    Objective:   DG Swallowing Func-Speech Pathology  Result Date: 12/21/2020 Table formatting from the original result was not included. Objective Swallowing Evaluation: Type of Study: MBS-Modified Barium Swallow Study  Patient Details Name: Beverly Li MRN: [id number] Date of Birth: 06-26-1945 Today's Date: 12/21/2020 Time: SLP Start Time (ACUTE ONLY): 1250 -SLP Stop Time (ACUTE ONLY): 1310 SLP Time Calculation (min) (ACUTE ONLY): 20 min Past Medical History: Past Medical History: Diagnosis Date  Anxiety   Arthritis   "some in my lower back; probably elbows, knees" (11/18/2017)  Atrial fibrillation (HCC)   Bell's palsy   when pt. was 75 yrs old, when under stress the left side of face will droop.  Complication of anesthesia   "vascular OR 2016; BP bottomed out; couldn't get it regulated; ended up in ICU for DAYS" (11/18/2017)  GERD (gastroesophageal reflux disease)   History of kidney stones   Hypertension   Hypertrophic cardiomyopathy (HCC)   severe LV basilar hypertrophy witn no evidence of significant outflow tract obstruction, EF 65-70%, mild LAE, mild TR, grade 1a diastolic dysfunction 05/15/10 (Dr. Donato Schultz) (Atrial Septal Hypertrophy pattern)-- Intra-op TEE with dsignificant outflow tract obstruction - AI, MR & TR  Insomnia   Mild aortic sclerosis   Osteopenia   Peripheral vascular disease (HCC)   Syncope   , Vagal Past Surgical History: Past Surgical History: Procedure Laterality Date  AUGMENTATION MAMMAPLASTY Bilateral   BACK SURGERY    CARDIAC CATHETERIZATION N/A 05/07/2016  Procedure: Left Heart Cath and Coronary Angiography;  Surgeon: Kathleene Hazel, MD;  Location: [facility] INVASIVE CV LAB;  Service: Cardiovascular;  Laterality: N/A;  CARDIOVERSION N/A 09/24/2017  Procedure: CARDIOVERSION;  Surgeon:  Laurey Morale, MD;  Location: [facility] ENDOSCOPY;  Service: Cardiovascular;  Laterality: N/A;  DILATION AND CURETTAGE OF UTERUS    ENDARTERECTOMY FEMORAL Right 03/02/2015  Procedure: ENDARTERECTOMY RIGHT FEMORAL;  Surgeon: Chuck Hint, MD;  Location: [facility] OR;  Service: Vascular;  Laterality: Right;  ESOPHAGOGASTRODUODENOSCOPY (EGD) WITH PROPOFOL N/A 12/01/2020  Procedure: ESOPHAGOGASTRODUODENOSCOPY (EGD) WITH PROPOFOL;  Surgeon: Diamantina Monks, MD;  Location: [facility] ENDOSCOPY;  Service: General;  Laterality: N/A;  FACIAL COSMETIC SURGERY Left 2002  "related to Bell's Palsy @ age 20; left eye/side of face droopy; tried to make area symmetrical"  FEMORAL-POPLITEAL BYPASS GRAFT Right 03/02/2015  Procedure: BYPASS GRAFT FEMORAL-BELOW KNEE POPLITEAL ARTERY;  Surgeon: Chuck Hint, MD;  Location: [facility] OR;  Service: Vascular;  Laterality: Right;  INGUINAL HERNIA REPAIR Bilateral 2002  IR ANGIO INTRA EXTRACRAN SEL COM CAROTID INNOMINATE UNI L MOD SED  11/16/2020  IR CT HEAD LTD  11/13/2020  IR PERCUTANEOUS ART THROMBECTOMY/INFUSION INTRACRANIAL INC DIAG ANGIO  11/13/2020  OVARIAN CYST REMOVAL Left   PEG PLACEMENT N/A 12/01/2020  Procedure: PERCUTANEOUS ENDOSCOPIC GASTROSTOMY (PEG) PLACEMENT;  Surgeon: Diamantina Monks, MD;  Location: [facility] ENDOSCOPY;  Service: General;  Laterality: N/A;  PERIPHERAL VASCULAR CATHETERIZATION N/A 01/16/2015  Procedure: Abdominal Aortogram;  Surgeon: Chuck Hint, MD;  Location: [facility] INVASIVE CV LAB;  Service: Cardiovascular;  Laterality: N/A;  POSTERIOR  LUMBAR FUSION  2015  "have plates and screws in there"  RADIOLOGY WITH ANESTHESIA N/A 11/13/2020  Procedure: IR WITH ANESTHESIA;  Surgeon: Radiologist, Medication, MD;  Location: [facility] OR;  Service: Radiology;  Laterality: N/A;  TONSILLECTOMY   HPI: Pt is a 75 y.o. female recently d/c to [facility] for rehab after admission 11/13/20-12/06/20 for multiple infarcts requiring revascularization and intubation, now readmitted 12/12/20 with  respiratory distress. CXR noted increased R pleural effusion, unchanged L pleural effusion. Workup for sepsis secondary to PNA; question possibility of bacterial pneumonia versus aspiration with possibility of compounding factor of CHF exacerbation. 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 MBS 8/24 with dx of severe primarily oral dysphagia with adequate ability to protect airway with nectar-thick and puree - recommended therapeutic trials of puree/teaspoons of nectar with SLP>  Subjective: alert Assessment / Plan / Recommendation CHL IP CLINICAL IMPRESSIONS 12/21/2020 Clinical Impression Pt presents with overall improved oropharyngeal function since 8/24 exam. She demonstrates improved awareness and intentional manipulation of solids and liquids.  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  Thin liquids spilled prematurely into pharynx, and on one instance there was trace aspiration - accompanied by a strong cough. Subsequent thin liquid boluses were swallowed with laryngeal vestibule closure occurring in time to protect the airway.  There was good pharyngeal stripping with no post-swallow residue with any consistency.  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  Results of exam/video were reviewed with pt and her husband and with Toni Amendourtney, the SLP who will be working with pt on CIR.  Recommend initiating a dysphagia 1 (pureed) diet with nectar thick liquids.  Pt may have sips of thin liquid outside of meals.  Give meds via PEG.  Dietitian to follow re: adjusting tube feedings. SLP should be able to advance diet based on clinical performance without repeating MBS.  SLP  Visit Diagnosis Dysphagia, oropharyngeal phase (R13.12) Attention and concentration deficit following -- Frontal lobe and executive function deficit following -- Impact on safety and function Mild aspiration risk   CHL IP TREATMENT RECOMMENDATION 12/21/2020 Treatment Recommendations Defer treatment plan to f/u with SLP   Prognosis 12/13/2020 Prognosis for Safe Diet Advancement Guarded Barriers to Reach Goals Language deficits Barriers/Prognosis Comment -- CHL IP DIET RECOMMENDATION 12/21/2020 SLP Diet Recommendations Dysphagia 1 (Puree) solids;Nectar thick liquid Liquid Administration via Cup Medication Administration Via alternative means Compensations Minimize environmental distractions Postural Changes --   CHL IP OTHER RECOMMENDATIONS 12/21/2020 Recommended Consults -- Oral Care Recommendations Oral care BID Other Recommendations Have oral suction available   CHL IP FOLLOW UP RECOMMENDATIONS 12/21/2020 Follow up Recommendations Inpatient Rehab   CHL IP FREQUENCY AND DURATION 12/14/2020 Speech Therapy Frequency (ACUTE ONLY) min 2x/week Treatment Duration --      CHL IP ORAL PHASE 12/21/2020 Oral Phase Impaired Oral - Pudding Teaspoon -- Oral - Pudding Cup -- Oral - Honey Teaspoon -- Oral - Honey Cup -- Oral - Nectar Teaspoon -- Oral - Nectar Cup -- Oral - Nectar Straw -- Oral - Thin Teaspoon -- Oral - Thin Cup -- Oral - Thin Straw -- Oral - Puree -- Oral - Mech Soft -- Oral - Regular -- Oral - Multi-Consistency -- Oral - Pill -- Oral  Phase - Comment --  CHL IP PHARYNGEAL PHASE 12/21/2020 Pharyngeal Phase Impaired Pharyngeal- Pudding Teaspoon -- Pharyngeal -- Pharyngeal- Pudding Cup -- Pharyngeal -- Pharyngeal- Honey Teaspoon -- Pharyngeal -- Pharyngeal- Honey Cup -- Pharyngeal -- Pharyngeal- Nectar Teaspoon Delayed swallow initiation-vallecula;Delayed swallow initiation-pyriform sinuses Pharyngeal Material enters airway, remains ABOVE vocal cords then ejected out Pharyngeal- Nectar Cup Delayed swallow  initiation-vallecula;Delayed swallow initiation-pyriform sinuses Pharyngeal Material enters airway, remains ABOVE vocal cords then ejected out Pharyngeal- Nectar Straw -- Pharyngeal -- Pharyngeal- Thin Teaspoon -- Pharyngeal -- Pharyngeal- Thin Cup Delayed swallow initiation-pyriform sinuses;Penetration/Aspiration before swallow;Trace aspiration Pharyngeal Material enters airway, passes BELOW cords and not ejected out despite cough attempt by patient Pharyngeal- Thin Straw -- Pharyngeal -- Pharyngeal- Puree Delayed swallow initiation-vallecula;Delayed swallow initiation-pyriform sinuses Pharyngeal -- Pharyngeal- Mechanical Soft -- Pharyngeal -- Pharyngeal- Regular Delayed swallow initiation-vallecula;Delayed swallow initiation-pyriform sinuses Pharyngeal -- Pharyngeal- Multi-consistency -- Pharyngeal -- Pharyngeal- Pill -- Pharyngeal -- Pharyngeal Comment --  No flowsheet data found. [name] 12/21/2020, 2:08 PM              Recent Labs    12/21/20 0204 12/22/20 0409  WBC 9.6 8.7  HGB 13.5 13.6  HCT 42.9 43.3  PLT 232 218   Recent Labs    12/21/20 0204 12/22/20 0409  NA 137 137  K 4.8 4.9  CL 98 99  CO2 31 29  GLUCOSE 82 91  BUN 28* 24*  CREATININE 0.54 0.61  CALCIUM 8.9 9.2    Intake/Output Summary (Last 24 hours) at 12/22/2020 1001 Last data filed at 12/21/2020 2056 Gross per 24 hour  Intake 225 ml  Output --  Net 225 ml     Pressure Injury 12/12/20 Coccyx Posterior old pressure injury scarring noted with red spots also with blanchable redness (Active)  12/12/20 2255  Location: Coccyx  Location Orientation: Posterior  Staging:   Wound Description (Comments): old pressure injury scarring noted with red spots also with blanchable redness  Present on Admission: Yes    Physical Exam: Vital Signs Blood pressure (!) 153/100, pulse 83, temperature 97.9 F (36.6 C), resp. rate 16, height 5\' 3"  (1.6 m), weight 49.8 kg, SpO2 99 %.  General: Alert and oriented x 3, No  apparent distress HEENT: Head is normocephalic, atraumatic, PERRLA, EOMI, sclera anicteric, oral mucosa pink and moist, dentition intact, ext ear canals clear,  Neck: Supple without JVD or lymphadenopathy Heart: Reg rate and rhythm. No murmurs rubs or gallops Chest: CTA bilaterally without wheezes, rales, or rhonchi; no distress, O2  Abdomen: Soft, non-tender, non-distended, bowel sounds positive. PEG in place, binder, minimal drainage Extremities: No clubbing, cyanosis, or edema. Pulses are 2+ Psych: affect appeared bright this am Skin: Clean and intact without signs of breakdown Neuro:  pt is alert. Makes eye contact. Could stick out tongue with extra time and cueing. Exp>rec aphasia. Right central 7. Poor oral-motor control. Does handle secretions. RUE and RLE 0/5, flaccid. Did not withdraw to pinch on right. Moved left side spontaneously. DTR's 2+ RLE Musculoskeletal: no pain with PROM.     Assessment/Plan: 1. Functional deficits which require 3+ hours per day of interdisciplinary therapy in a comprehensive inpatient rehab setting. Physiatrist is providing close team supervision and 24 hour management of active medical problems listed below. Physiatrist and rehab team continue to assess barriers to discharge/monitor patient progress toward functional and medical goals  Care Tool:  Bathing              Bathing assist  Upper Body Dressing/Undressing Upper body dressing        Upper body assist Assist Level: Maximal Assistance - Patient 25 - 49%    Lower Body Dressing/Undressing Lower body dressing            Lower body assist       Toileting Toileting    Toileting assist Assist for toileting: Maximal Assistance - Patient 25 - 49%     Transfers Chair/bed transfer  Transfers assist           Locomotion Ambulation   Ambulation assist              Walk 10 feet activity   Assist           Walk 50 feet activity   Assist            Walk 150 feet activity   Assist           Walk 10 feet on uneven surface  activity   Assist           Wheelchair     Assist               Wheelchair 50 feet with 2 turns activity    Assist            Wheelchair 150 feet activity     Assist          Blood pressure (!) 153/100, pulse 83, temperature 97.9 F (36.6 C), resp. rate 16, height 5\' 3"  (1.6 m), weight 49.8 kg, SpO2 99 %. Medical Problem List and Plan: 1.  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  Antithrombotics: -DVT/anticoagulation: Eliquis [employer]: Other (comment)             -antiplatelet therapy: N/A 3. Pain Management: Tylenol as needed 4. Mood: Wellbutrin 75 mg twice daily             -antipsychotic agents: N/A 5. Neuropsych: This patient is not capable of making decisions on her own behalf. 6. Skin/Wound Care: Routine skin checks 7. Fluids/Electrolytes/Nutrition: encourage PO  -BUN sl elevated. Add FW thru tube  -protein for low albumin  -all labs personally reviewed 8.  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 12/03/2020-  -MBS demonstrated improvement in oropharyngeal function. Still apraxic  -D1 nectars recommended, med via PEG--begin today 9.  Atrial fibrillation.  Eliquis.  Amiodarone 200 mg daily, Lopressor 12.5 mg twice daily.  Cardiac rate controlled at present 10.  Hyperlipidemia.  Zetia 11.  COPD.Revefenscin nebulizer daily, Brovana as well as Pulmicort 12.  Acute on chronic diastolic congestive heart failure.     Filed Weights   12/21/20 1543 12/21/20 1559 12/22/20 0439  Weight: 51.9 kg 50.8 kg 49.8 kg    -weights trending down---monitor 13. HTN: well controlled until this morning's  reading--follow for trend  -no changes       LOS: 1 days A FACE TO FACE EVALUATION WAS PERFORMED  02/21/21 12/22/2020, 10:01 AM

## 2020-12-22 NOTE — Progress Notes (Signed)
Initial Nutrition Assessment  DOCUMENTATION CODES:   Severe malnutrition in context of chronic illness  INTERVENTION:  Provide Ensure Enlive po BID (thickened to nectar thick consistency), each supplement provides 350 kcal and 20 grams of protein.  Allow pt to eat at meals, if meal completion is less than 50% then provide a bolus tube feed via PEG of Jevity 1.2 cal at volume of 356 ml (1.5 cartons/ARCs) up to TID  Additionally provide a bolus tube feed at HS.   Each bolus feed provides 427 kcal and 20 grams of protein.   Free water flushes of 250 ml QID per tube.   NUTRITION DIAGNOSIS:   Severe Malnutrition related to chronic illness (CHF) as evidenced by severe muscle depletion, severe fat depletion.  GOAL:   Patient will meet greater than or equal to 90% of their needs  MONITOR:   PO intake, Supplement acceptance, Diet advancement, Skin, TF tolerance, Weight trends, Labs, I & O's  REASON FOR ASSESSMENT:   Consult Enteral/tube feeding initiation and management  ASSESSMENT:   75-year  female with history of chronic diastolic congestive heart failure, atrial fibrillation maintained on Eliquis, Bell's palsy, hypertension, OSA, prediabetes, right femoral-popliteal bypass graft stenosis followed by vascular surgery, recent hospitalization 8/1-8/24/2022 for left MCA infarction status post thrombectomy with residual right-sided weakness as well as dysphagia and PEG tube placed 12/01/2020  Pt nonverbal. No family at bedside. Diet has been advanced to a dysphagia 1 diet with nectar thick liquids this morning. Meal completion 10% at lunch. Tube feeding regimen modified to provide a bolus tube feed if po intake at a meal is less than 50%. Pt to additionally receive a bolus feed nightly. RD to order Ensure to aid in PO intake.   NUTRITION - FOCUSED PHYSICAL EXAM:  Flowsheet Row Most Recent Value  Orbital Region Moderate depletion  Upper Arm Region Severe depletion  Thoracic and  Lumbar Region Severe depletion  Buccal Region Moderate depletion  Temple Region Moderate depletion  Clavicle Bone Region Severe depletion  Clavicle and Acromion Bone Region Severe depletion  Scapular Bone Region Unable to assess  Patellar Region Moderate depletion  Anterior Thigh Region Moderate depletion  Posterior Calf Region Moderate depletion  Edema (RD Assessment) None  Hair Reviewed  Eyes Reviewed  Mouth Reviewed  Skin Reviewed  Nails Reviewed      Labs and medications reviewed.   Diet Order:   Diet Order             DIET - DYS 1 Room service appropriate? Yes; Fluid consistency: Nectar Thick  Diet effective now                   EDUCATION NEEDS:   Not appropriate for education at this time  Skin:  Skin Assessment: Skin Integrity Issues:  Last BM:  9/9  Height:   Ht Readings from Last 1 Encounters:  12/21/20 5\' 3"  (1.6 m)    Weight:   Wt Readings from Last 1 Encounters:  12/22/20 49.8 kg   BMI:  Body mass index is 19.45 kg/m.  Estimated Nutritional Needs:   Kcal:  1600-1800  Protein:  80-90 grams  Fluid:  >/= 1.6 L/day  02/21/21, MS, RD, LDN RD pager number/after hours weekend pager number on Amion.

## 2020-12-23 DIAGNOSIS — I639 Cerebral infarction, unspecified: Secondary | ICD-10-CM | POA: Diagnosis not present

## 2020-12-23 DIAGNOSIS — I482 Chronic atrial fibrillation, unspecified: Secondary | ICD-10-CM | POA: Diagnosis not present

## 2020-12-23 DIAGNOSIS — I63512 Cerebral infarction due to unspecified occlusion or stenosis of left middle cerebral artery: Secondary | ICD-10-CM | POA: Diagnosis not present

## 2020-12-23 DIAGNOSIS — I69391 Dysphagia following cerebral infarction: Secondary | ICD-10-CM | POA: Diagnosis not present

## 2020-12-23 LAB — BASIC METABOLIC PANEL
Anion gap: 7 (ref 5–15)
BUN: 24 mg/dL — ABNORMAL HIGH (ref 8–23)
CO2: 28 mmol/L (ref 22–32)
Calcium: 8.9 mg/dL (ref 8.9–10.3)
Chloride: 102 mmol/L (ref 98–111)
Creatinine, Ser: 0.6 mg/dL (ref 0.44–1.00)
GFR, Estimated: >60 mL/min (ref >60)
Glucose, Bld: 93 mg/dL (ref 70–99)
Potassium: 4.6 mmol/L (ref 3.5–5.1)
Sodium: 137 mmol/L (ref 135–145)

## 2020-12-23 LAB — GLUCOSE, CAPILLARY
Glucose-Capillary: 117 mg/dL — ABNORMAL HIGH (ref 70–99)
Glucose-Capillary: 153 mg/dL — ABNORMAL HIGH (ref 70–99)
Glucose-Capillary: 170 mg/dL — ABNORMAL HIGH (ref 70–99)
Glucose-Capillary: 74 mg/dL (ref 70–99)
Glucose-Capillary: 79 mg/dL (ref 70–99)
Glucose-Capillary: 96 mg/dL (ref 70–99)

## 2020-12-23 NOTE — Progress Notes (Signed)
Physical Therapy Session Note  Patient Details  Name: Beverly Li MRN: [id number] Date of Birth: Oct 03, 1945  Today's Date: 12/23/2020 PT Individual Time: 3606-7703 PT Individual Time Calculation (min): 71 min   Short Term Goals: Week 1:  PT Short Term Goal 1 (Week 1): Pt will complete bed mobility with modA. PT Short Term Goal 2 (Week 1): Pt will complete sit to stand transfer with modA +1. PT Short Term Goal 3 (Week 1): Pt will complete bed to chair transfer with modA +1. PT Short Term Goal 4 (Week 1): Pt will ambulate x50' with modA +2.  Skilled Therapeutic Interventions/Progress Updates:     Pt received seated in WC and agreeable to therapy. No apparent pain. WC transport to gym for time management. Pt performs squat pivot transfer to the R to mat table with maxA and cues for anterior trunk lean and head hips relationship. Pt performs sitting balance activity, initially tasked with controlling trunk as she extends hips and then flexes hips. Mirror positioned for visual feedback. Tech sitting on theraball behind pt to provide posterior target for trunk/hip extension. PT positioned in front of pt to provide anterior target for trunk/hip flexion. Pt able to complete task multiple bouts with minA and max verbal cueing. Pt then performs alternating side-leaning on elbows, with PT providing stability at R elbow. Performed to provide approximation of R upper extremity joint capsules for increased proprioceptive feedback, as well sa providing pt with increased awareness of midline orientation.  Pt performs sit to stand with maxA and practices standing balance with PT providing blocking of R knee. PT facilitates lateral weight shifting and provides mod to maxA at hips and trunk due to consistent posterior bias.   Pt performs gait training in hallway with L handrail and +2 WC follow. Mirror positioned for visual feedback. PT provides maxA for total management of R lower extremity, as well as lateral  weight shifting in order to clear L foot for swing phase. PT demonstrates reciprocal gait pattern with swing through technique and pt is able to replicate several times during gait cycle, but tends to utilize step-to pattern, likely associated with inadequate weight shifting to the R during stance phase.   Pt performs standing toe tap activity with L leg and PT blocking R knee and providing assistance for lateral weight shifting. Pt tasked with tapping target several inches in front of L foot. Pt able to complete x10 reps but then noted to be attempting to sit back down, likely associated with fatigue.  Pt left seated in WC with alarm intact and all needs within reach.  Therapy Documentation Precautions:  Precautions Precautions: Fall, Other (comment) Precaution Comments: R hemiplegia with R shoulder subluxation, PEG, adominal binder; bladder/bowel incontinence Restrictions Weight Bearing Restrictions: No    Therapy/Group: Individual Therapy  Beau Fanny, PT, DPT 12/23/2020, 4:03 PM

## 2020-12-23 NOTE — Progress Notes (Signed)
Speech Language Pathology Daily Session Note  Patient Details  Name: Beverly Li MRN: [id number] Date of Birth: November 20, 1945  Today's Date: 12/23/2020 SLP Individual Time: 2595-6387 SLP Individual Time Calculation (min): 47 min  Short Term Goals: Week 1: SLP Short Term Goal 1 (Week 1): Patient will consume current diet with minimal overt s/s of aspiration with Min verba and visual cues for use of swallowing compensatory strategies. SLP Short Term Goal 2 (Week 1): Patient will consume thin liquids with minimal overt s/s of aspiration with Min verbal cues for use of small sips via cup. SLP Short Term Goal 3 (Week 1): Patient will answer yes/no questions utilizing multimodal communication with 25% accuracy and Max A multimodal cues. SLP Short Term Goal 4 (Week 1): Patient will vocalize on command in 25% of opportunities with Max A multimodal cues.  Skilled Therapeutic Interventions: Pt seen for skilled ST with focus on communication and swallowing goals, pt husband present throughout. SLP facilitating oral care by providing total A with suction toothbrush due to pt difficulty following directions, suspect pt can complete with supervision when less fatigued. HOB raised for trials of thin liquids and Dys 1 solids. Initial thin liquid bolus (1/2 tsp) with poor swallow coordination and airway protection resulting in large coughing episode. Remaining trials with no s/s aspiration, however apparent delayed swallow initiation and decreased bolus awareness. Pt self feeding 1/2 tsp pudding with min A, no overt s/s aspiration noted. SLP facilitating yes/no questions providing max multimodal cues with pt response ~10% via inconsistent and very minimal head nod/shake. SLP showing pt photo of children to which patient verbalized "aww" which husband was very pleased with! Unfortunately no other vocalizations despite max cues and strategies. Pt eventually falling asleep toward end of tx session and husband educated  on goals of tx and POC, verbalized understanding. Pt left in bed with alarm set and husband present for needs, cont ST POC.   Pain Pain Assessment Pain Scale: Faces Faces Pain Scale: No hurt  Therapy/Group: Individual Therapy  Tacey Ruiz 12/23/2020, 2:35 PM

## 2020-12-23 NOTE — Plan of Care (Signed)
  Problem: RH BOWEL ELIMINATION Goal: RH STG MANAGE BOWEL WITH ASSISTANCE Description: STG Manage Bowel with min Assistance. Outcome: Not Progressing; incontinence   Problem: RH BLADDER ELIMINATION Goal: RH STG MANAGE BLADDER WITH ASSISTANCE Description: STG Manage Bladder With min Assistance Outcome: Not Progressing; incontinence   Problem: RH KNOWLEDGE DEFICIT GENERAL Goal: RH STG INCREASE KNOWLEDGE OF SELF CARE AFTER HOSPITALIZATION Description: Patient's family will be able to manage care and nutritional means using handouts and educational resources independently Outcome: Not Progressing; global aphasia

## 2020-12-23 NOTE — Progress Notes (Signed)
Occupational Therapy Session Note  Patient Details  Name: Beverly Li MRN: [id number] Date of Birth: 1946/04/12  Today's Date: 12/23/2020 OT Individual Time: 1859-0931 OT Individual Time Calculation (min): 56 min    Short Term Goals: Week 1:  OT Short Term Goal 1 (Week 1): Patient will maintain sitting balance with mod A during functional BADL task OT Short Term Goal 2 (Week 1): Patient will perofrm sit<>stand at the sink with mod A in preparation for BADL task OT Short Term Goal 3 (Week 1): Patient will complete 1 step of UB dressing task with mod questioning cues.  Skilled Therapeutic Interventions/Progress Updates:    Pt greeted upright in bed eating breakfast with nurse tech and spouse. Pt was able to self-feed occasionally when given opportunity. Pt was able to manipulate feeding utensil to mouth, but needed some assist to scoop food at times. Pt incontinent of urine and worked on rolling with mod A R and max A L for total A peri-care and brief change. Pt completed bed mobility with total A, then worked on sitting balance at EOB with min/mod A. Stedy transfer bed to wc to assess appropriateness for nursing. Pt able to come to standing in Ray with mod A and transition to wc with Ot assist to support R UE but improve overall trunk control. Bathing/dressing from wc at the sink with focus on sit<>stands, R side awareness, and hemi techniques. Sit<>stands with max A, but once balanced in standing, was able to remove L UE to assist with brushing hair. Max multimodal cues and hadn over hand to initiate hair brushnig task. Pt demonstrated some apraxia during BADL session and was non verbal throughout. Pt left seated in wc at end of session with R UE on 1/2 lap tray. OT placed SAEBO e-stim for shoulder sublux. SAEBO left on for 60 minutes. OT returned to remove SAEBO with skin intact and no adverse reactions.  [place]o Stim One 330 pulse width 35 Hz pulse rate On 8 sec/ off 8 sec Ramp up/ down 2  sec Symmetrical Biphasic wave form  Max intensity 168m at 500 Ohm load  Husbadn present, chair alarm belt on, needs met.    Therapy Documentation Precautions:  Precautions Precautions: Fall, Other (comment) Precaution Comments: R hemiplegia with R shoulder subluxation, PEG, adominal binder; bladder/bowel incontinence Restrictions Weight Bearing Restrictions: No  Pain:  No pain noted   Therapy/Group: Individual Therapy  EValma Cava9/01/2021, 8:38 AM

## 2020-12-23 NOTE — Progress Notes (Signed)
PROGRESS NOTE   Subjective/Complaints: No issues overnight. Pt awake, seemed a little anxious when I came in. Had both braces in place  ROS: limited due to language/communication    Objective:   DG Swallowing Func-Speech Pathology  Result Date: 12/21/2020 Table formatting from the original result was not included. Objective Swallowing Evaluation: Type of Study: MBS-Modified Barium Swallow Study  Patient Details Name: KITRINA MAURIN MRN: [id number] Date of Birth: 1945-09-25 Today's Date: 12/21/2020 Time: SLP Start Time (ACUTE ONLY): 1250 -SLP Stop Time (ACUTE ONLY): 1310 SLP Time Calculation (min) (ACUTE ONLY): 20 min Past Medical History: Past Medical History: Diagnosis Date  Anxiety   Arthritis   "some in my lower back; probably elbows, knees" (11/18/2017)  Atrial fibrillation (HCC)   Bell's palsy   when pt. was 75 yrs old, when under stress the left side of face will droop.  Complication of anesthesia   "vascular OR 2016; BP bottomed out; couldn't get it regulated; ended up in ICU for DAYS" (11/18/2017)  GERD (gastroesophageal reflux disease)   History of kidney stones   Hypertension   Hypertrophic cardiomyopathy (HCC)   severe LV basilar hypertrophy witn no evidence of significant outflow tract obstruction, EF 65-70%, mild LAE, mild TR, grade 1a diastolic dysfunction 05/15/10 (Dr. Donato Schultz) (Atrial Septal Hypertrophy pattern)-- Intra-op TEE with dsignificant outflow tract obstruction - AI, MR & TR  Insomnia   Mild aortic sclerosis   Osteopenia   Peripheral vascular disease (HCC)   Syncope   , Vagal Past Surgical History: Past Surgical History: Procedure Laterality Date  AUGMENTATION MAMMAPLASTY Bilateral   BACK SURGERY    CARDIAC CATHETERIZATION N/A 05/07/2016  Procedure: Left Heart Cath and Coronary Angiography;  Surgeon: Kathleene Hazel, MD;  Location: [facility] INVASIVE CV LAB;  Service: Cardiovascular;  Laterality: N/A;  CARDIOVERSION N/A  09/24/2017  Procedure: CARDIOVERSION;  Surgeon: Laurey Morale, MD;  Location: [facility] ENDOSCOPY;  Service: Cardiovascular;  Laterality: N/A;  DILATION AND CURETTAGE OF UTERUS    ENDARTERECTOMY FEMORAL Right 03/02/2015  Procedure: ENDARTERECTOMY RIGHT FEMORAL;  Surgeon: Chuck Hint, MD;  Location: [facility] OR;  Service: Vascular;  Laterality: Right;  ESOPHAGOGASTRODUODENOSCOPY (EGD) WITH PROPOFOL N/A 12/01/2020  Procedure: ESOPHAGOGASTRODUODENOSCOPY (EGD) WITH PROPOFOL;  Surgeon: Diamantina Monks, MD;  Location: [facility] ENDOSCOPY;  Service: General;  Laterality: N/A;  FACIAL COSMETIC SURGERY Left 2002  "related to Bell's Palsy @ age 56; left eye/side of face droopy; tried to make area symmetrical"  FEMORAL-POPLITEAL BYPASS GRAFT Right 03/02/2015  Procedure: BYPASS GRAFT FEMORAL-BELOW KNEE POPLITEAL ARTERY;  Surgeon: Chuck Hint, MD;  Location: [facility] OR;  Service: Vascular;  Laterality: Right;  INGUINAL HERNIA REPAIR Bilateral 2002  IR ANGIO INTRA EXTRACRAN SEL COM CAROTID INNOMINATE UNI L MOD SED  11/16/2020  IR CT HEAD LTD  11/13/2020  IR PERCUTANEOUS ART THROMBECTOMY/INFUSION INTRACRANIAL INC DIAG ANGIO  11/13/2020  OVARIAN CYST REMOVAL Left   PEG PLACEMENT N/A 12/01/2020  Procedure: PERCUTANEOUS ENDOSCOPIC GASTROSTOMY (PEG) PLACEMENT;  Surgeon: Diamantina Monks, MD;  Location: [facility] ENDOSCOPY;  Service: General;  Laterality: N/A;  PERIPHERAL VASCULAR CATHETERIZATION N/A 01/16/2015  Procedure: Abdominal Aortogram;  Surgeon: Chuck Hint, MD;  Location: [facility] INVASIVE CV LAB;  Service: Cardiovascular;  Laterality: N/A;  POSTERIOR LUMBAR FUSION  2015  "have plates and screws in there"  RADIOLOGY WITH ANESTHESIA N/A 11/13/2020  Procedure: IR WITH ANESTHESIA;  Surgeon: Radiologist, Medication, MD;  Location: [facility] OR;  Service: Radiology;  Laterality: N/A;  TONSILLECTOMY   HPI: Pt is a 75 y.o. female recently d/c to [facility] rehab after admission 11/13/20-12/06/20 for multiple infarcts requiring revascularization and  intubation, now readmitted 12/12/20 with respiratory distress. CXR noted increased R pleural effusion, unchanged L pleural effusion. Workup for sepsis secondary to PNA; question possibility of bacterial pneumonia versus aspiration with possibility of compounding factor of CHF exacerbation. 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 MBS 8/24 with dx of severe primarily oral dysphagia with adequate ability to protect airway with nectar-thick and puree - recommended therapeutic trials of puree/teaspoons of nectar with SLP>  Subjective: alert Assessment / Plan / Recommendation CHL IP CLINICAL IMPRESSIONS 12/21/2020 Clinical Impression Pt presents with overall improved oropharyngeal function since 8/24 exam. She demonstrates improved awareness and intentional manipulation of solids and liquids.  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  Thin liquids spilled prematurely into pharynx, and on one instance there was trace aspiration - accompanied by a strong cough. Subsequent thin liquid boluses were swallowed with laryngeal vestibule closure occurring in time to protect the airway.  There was good pharyngeal stripping with no post-swallow residue with any consistency.  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  Results of exam/video were reviewed with pt and her husband and with Toni Amendourtney, the SLP who will be working with pt on CIR.  Recommend initiating a dysphagia 1 (pureed) diet with nectar thick liquids.  Pt may have sips of thin liquid outside of meals.  Give meds via PEG.  Dietitian to follow re: adjusting tube feedings. SLP should be able to advance diet based on clinical  performance without repeating MBS.  SLP Visit Diagnosis Dysphagia, oropharyngeal phase (R13.12) Attention and concentration deficit following -- Frontal lobe and executive function deficit following -- Impact on safety and function Mild aspiration risk   CHL IP TREATMENT RECOMMENDATION 12/21/2020 Treatment Recommendations Defer treatment plan to f/u with SLP   Prognosis 12/13/2020 Prognosis for Safe Diet Advancement Guarded Barriers to Reach Goals Language deficits Barriers/Prognosis Comment -- CHL IP DIET RECOMMENDATION 12/21/2020 SLP Diet Recommendations Dysphagia 1 (Puree) solids;Nectar thick liquid Liquid Administration via Cup Medication Administration Via alternative means Compensations Minimize environmental distractions Postural Changes --   CHL IP OTHER RECOMMENDATIONS 12/21/2020 Recommended Consults -- Oral Care Recommendations Oral care BID Other Recommendations Have oral suction available   CHL IP FOLLOW UP RECOMMENDATIONS 12/21/2020 Follow up Recommendations Inpatient Rehab   CHL IP FREQUENCY AND DURATION 12/14/2020 Speech Therapy Frequency (ACUTE ONLY) min 2x/week Treatment Duration --      CHL IP ORAL PHASE 12/21/2020 Oral Phase Impaired Oral - Pudding Teaspoon -- Oral - Pudding Cup -- Oral - Honey Teaspoon -- Oral - Honey Cup -- Oral - Nectar Teaspoon -- Oral - Nectar Cup -- Oral - Nectar Straw -- Oral - Thin Teaspoon -- Oral - Thin Cup -- Oral - Thin Straw -- Oral - Puree -- Oral - Mech Soft -- Oral - Regular -- Oral -  Multi-Consistency -- Oral - Pill -- Oral Phase - Comment --  CHL IP PHARYNGEAL PHASE 12/21/2020 Pharyngeal Phase Impaired Pharyngeal- Pudding Teaspoon -- Pharyngeal -- Pharyngeal- Pudding Cup -- Pharyngeal -- Pharyngeal- Honey Teaspoon -- Pharyngeal -- Pharyngeal- Honey Cup -- Pharyngeal -- Pharyngeal- Nectar Teaspoon Delayed swallow initiation-vallecula;Delayed swallow initiation-pyriform sinuses Pharyngeal Material enters airway, remains ABOVE vocal cords then ejected out Pharyngeal- Nectar Cup  Delayed swallow initiation-vallecula;Delayed swallow initiation-pyriform sinuses Pharyngeal Material enters airway, remains ABOVE vocal cords then ejected out Pharyngeal- Nectar Straw -- Pharyngeal -- Pharyngeal- Thin Teaspoon -- Pharyngeal -- Pharyngeal- Thin Cup Delayed swallow initiation-pyriform sinuses;Penetration/Aspiration before swallow;Trace aspiration Pharyngeal Material enters airway, passes BELOW cords and not ejected out despite cough attempt by patient Pharyngeal- Thin Straw -- Pharyngeal -- Pharyngeal- Puree Delayed swallow initiation-vallecula;Delayed swallow initiation-pyriform sinuses Pharyngeal -- Pharyngeal- Mechanical Soft -- Pharyngeal -- Pharyngeal- Regular Delayed swallow initiation-vallecula;Delayed swallow initiation-pyriform sinuses Pharyngeal -- Pharyngeal- Multi-consistency -- Pharyngeal -- Pharyngeal- Pill -- Pharyngeal -- Pharyngeal Comment --  No flowsheet data found. [name] 12/21/2020, 2:08 PM              Recent Labs    12/21/20 0204 12/22/20 0409  WBC 9.6 8.7  HGB 13.5 13.6  HCT 42.9 43.3  PLT 232 218   Recent Labs    12/22/20 0409 12/23/20 0530  NA 137 137  K 4.9 4.6  CL 99 102  CO2 29 28  GLUCOSE 91 93  BUN 24* 24*  CREATININE 0.61 0.60  CALCIUM 9.2 8.9    Intake/Output Summary (Last 24 hours) at 12/23/2020 0854 Last data filed at 12/23/2020 0820 Gross per 24 hour  Intake 120 ml  Output --  Net 120 ml     Pressure Injury 12/12/20 Coccyx Posterior old pressure injury scarring noted with red spots also with blanchable redness (Active)  12/12/20 2255  Location: Coccyx  Location Orientation: Posterior  Staging:   Wound Description (Comments): old pressure injury scarring noted with red spots also with blanchable redness  Present on Admission: Yes    Physical Exam: Vital Signs Blood pressure 123/82, pulse 92, temperature 97.8 F (36.6 C), resp. rate 16, height 5\' 3"  (1.6 m), weight 48.9 kg, SpO2 99 %.  General: Alert and  oriented x 3, No apparent distress HEENT: Head is normocephalic, atraumatic, PERRLA, EOMI, sclera anicteric, oral mucosa pink and moist, dentition intact, ext ear canals clear,  Neck: Supple without JVD or lymphadenopathy Heart: Reg rate and rhythm. No murmurs rubs or gallops Chest: CTA bilaterally without wheezes, rales, or rhonchi; no distress, O2 [place] Abdomen: Soft, non-tender, non-distended, bowel sounds positive. PEG in place, binder, minimal drainage Extremities: No clubbing, cyanosis, or edema. Pulses are 2+ Psych: a little anxious, frustrated appearing Skin: old coccyx wound/scar Neuro:  pt is alert. Makes eye contact. Could stick out tongue with extra time and cueing. Exp>rec aphasia. Right central 7. Poor oral-motor control. Does handle secretions. RUE and RLE 0/5, flaccid. Did not withdraw to pinch on right. Moved left side spontaneously. DTR's 2+ RLE with 2 bts clonus Musculoskeletal:  no pain with PROM.     Assessment/Plan: 1. Functional deficits which require 3+ hours per day of interdisciplinary therapy in a comprehensive inpatient rehab setting. Physiatrist is providing close team supervision and 24 hour management of active medical problems listed below. Physiatrist and rehab team continue to assess barriers to discharge/monitor patient progress toward functional and medical goals  Care Tool:  Bathing    Body parts bathed by patient: Front perineal area,  Abdomen, Chest, Face   Body parts bathed by helper: Right arm, Left arm, Buttocks, Right upper leg, Left lower leg, Right lower leg, Left upper leg     Bathing assist Assist Level: Total Assistance - Patient < 25%     Upper Body Dressing/Undressing Upper body dressing        Upper body assist Assist Level: Total Assistance - Patient < 25%    Lower Body Dressing/Undressing Lower body dressing      What is the patient wearing?: Pants     Lower body assist Assist for lower body dressing: Dependent - Patient  0%     Toileting Toileting    Toileting assist Assist for toileting: Dependent - Patient 0%     Transfers Chair/bed transfer  Transfers assist     Chair/bed transfer assist level: Maximal Assistance - Patient 25 - 49%     Locomotion Ambulation   Ambulation assist      Assist level: 2 helpers Assistive device: Hand held assist (L hall rail) Max distance: 20'   Walk 10 feet activity   Assist     Assist level: 2 helpers Assistive device: Hand held assist (L hall rail)   Walk 50 feet activity   Assist Walk 50 feet with 2 turns activity did not occur: Safety/medical concerns         Walk 150 feet activity   Assist Walk 150 feet activity did not occur: Safety/medical concerns         Walk 10 feet on uneven surface  activity   Assist Walk 10 feet on uneven surfaces activity did not occur: Safety/medical concerns         Wheelchair     Assist Is the patient using a wheelchair?: Yes Type of Wheelchair: Manual    Wheelchair assist level: Dependent - Patient 0% Max wheelchair distance: 150'    Wheelchair 50 feet with 2 turns activity    Assist        Assist Level: Dependent - Patient 0%   Wheelchair 150 feet activity     Assist      Assist Level: Dependent - Patient 0%   Blood pressure 123/82, pulse 92, temperature 97.8 F (36.6 C), resp. rate 16, height  (1.6 m), weight 48.9 kg, SpO2 99 %. Medical Problem List and Plan: 1.  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  Antithrombotics: -DVT/anticoagulation: [employer]: Other (comment)             -antiplatelet therapy: N/A 3. Pain Management: Tylenol as needed 4. Mood: Wellbutrin 75 mg twice daily             -antipsychotic  agents: N/A 5. Neuropsych: This patient is not capable of making decisions on her own behalf. 6. Skin/Wound Care: Routine skin checks 7. Fluids/Electrolytes/Nutrition: encourage PO  -BUN sl elevated. Added FW thru tube  -protein for low albumin  -all labs personally reviewed---recheck Monday  -ate 10-30% meals so far, receiving TF after meal if not eating 50% or more 8.  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 [name]-  -MBS demonstrated improvement in oropharyngeal function with apraxia  -  D1 nectars initiated 9/9, meds via PEG 9.  Atrial fibrillation.  Eliquis.  Amiodarone 200 mg daily, Lopressor 12.5 mg twice daily.  Cardiac rate controlled at present 10.  Hyperlipidemia.  Zetia 11.  COPD.Revefenscin nebulizer daily, Brovana as well as Pulmicort 12.  Acute on chronic diastolic congestive heart failure.     Filed Weights   12/21/20 1559 12/22/20 0439 12/23/20 0500  Weight: 50.8 kg 49.8 kg 48.9 kg    -weights still trending down---monitor 13. HTN: well controlled until this morning's reading--follow for trend  -no changes       LOS: 2 days A FACE TO FACE EVALUATION WAS PERFORMED  Ranelle Oyster 12/23/2020, 8:54 AM

## 2020-12-24 DIAGNOSIS — I639 Cerebral infarction, unspecified: Secondary | ICD-10-CM | POA: Diagnosis not present

## 2020-12-24 DIAGNOSIS — I63512 Cerebral infarction due to unspecified occlusion or stenosis of left middle cerebral artery: Secondary | ICD-10-CM | POA: Diagnosis not present

## 2020-12-24 DIAGNOSIS — I482 Chronic atrial fibrillation, unspecified: Secondary | ICD-10-CM | POA: Diagnosis not present

## 2020-12-24 DIAGNOSIS — I69391 Dysphagia following cerebral infarction: Secondary | ICD-10-CM | POA: Diagnosis not present

## 2020-12-24 LAB — GLUCOSE, CAPILLARY
Glucose-Capillary: 119 mg/dL — ABNORMAL HIGH (ref 70–99)
Glucose-Capillary: 73 mg/dL (ref 70–99)
Glucose-Capillary: 84 mg/dL (ref 70–99)
Glucose-Capillary: 86 mg/dL (ref 70–99)
Glucose-Capillary: 90 mg/dL (ref 70–99)
Glucose-Capillary: 90 mg/dL (ref 70–99)

## 2020-12-24 MED ORDER — ORAL CARE MOUTH RINSE
15.0000 mL | Freq: Two times a day (BID) | OROMUCOSAL | Status: DC
  Administered 2020-12-24 – 2021-01-15 (×44): 15 mL via OROMUCOSAL

## 2020-12-24 MED ORDER — SENNOSIDES-DOCUSATE SODIUM 8.6-50 MG PO TABS
1.0000 | ORAL_TABLET | Freq: Two times a day (BID) | ORAL | Status: DC
  Administered 2020-12-24 – 2020-12-27 (×7): 1 via ORAL
  Filled 2020-12-24 (×7): qty 1

## 2020-12-24 NOTE — Plan of Care (Signed)
  Problem: RH BOWEL ELIMINATION Goal: RH STG MANAGE BOWEL WITH ASSISTANCE Description: STG Manage Bowel with min Assistance. Outcome: Progressing; pt has small bm's ; laxatives ordered   Problem: RH BLADDER ELIMINATION Goal: RH STG MANAGE BLADDER WITH ASSISTANCE Description: STG Manage Bladder With min Assistance Outcome: Not Progressing; incontinence   Problem: RH KNOWLEDGE DEFICIT GENERAL Goal: RH STG INCREASE KNOWLEDGE OF SELF CARE AFTER HOSPITALIZATION Description: Patient's family will be able to manage care and nutritional means using handouts and educational resources independently Outcome: Not Progressing; global aphasia

## 2020-12-24 NOTE — Progress Notes (Signed)
Speech Language Pathology Daily Session Note  Patient Details  Name: Beverly Li MRN: [id number] Date of Birth: 02-01-1946  Today's Date: 12/24/2020 SLP Individual Time: 1202-1234 SLP Individual Time Calculation (min): 32 min  Short Term Goals: Week 1: SLP Short Term Goal 1 (Week 1): Patient will consume current diet with minimal overt s/s of aspiration with Min verba and visual cues for use of swallowing compensatory strategies. SLP Short Term Goal 2 (Week 1): Patient will consume thin liquids with minimal overt s/s of aspiration with Min verbal cues for use of small sips via cup. SLP Short Term Goal 3 (Week 1): Patient will answer yes/no questions utilizing multimodal communication with 25% accuracy and Max A multimodal cues. SLP Short Term Goal 4 (Week 1): Patient will vocalize on command in 25% of opportunities with Max A multimodal cues.  Skilled Therapeutic Interventions: Pt seen for skilled ST services with focus on dysphagia and communication goals. SLP seeing patient for lunch meal, husband and son present and reporting pt ate 75% AM meal with limited assist for self feeding. SLP providing set up A for meal with patient self-feeding throughout, benefiting from occasional assistance to reduce bolus size on spoon. Pt with no overt s/s aspiration across any intake of Dys 1/NTL, however oral and pharyngeal phase of swallow do continue to appear delayed and extended. SLP facilitating minimal trials of Dys 2 texture, pt demonstrating mild extended oral phase (not significantly different than timing with Dys 1 texture) and no s/s aspiration with trials. Pt clearing Dys 1 and Dys 2 solids with trace to no oral stasis. Pt benefits from verbal cues to alternate solids and liquids. Pt with no verbalizations this date despite max A multimodal cueing, continues with inconsistent minimal head nod/shake in response to yes/no questions. Husband states pt did a "thumbs up" earlier today. Pt left in  wheelchair with alarm set and family present. Spoke with nurse about results of therapy session and pt intake. Cont ST POC.    Pain Pain Assessment Pain Scale: 0-10 Pain Score: 0-No pain  Therapy/Group: Individual Therapy  Tacey Ruiz 12/24/2020, 12:42 PM

## 2020-12-24 NOTE — Progress Notes (Signed)
PROGRESS NOTE   Subjective/Complaints: Had a restful night it appears. No problems this morning.   ROS: limited due to language/communication    Objective:   No results found. Recent Labs    12/22/20 0409  WBC 8.7  HGB 13.6  HCT 43.3  PLT 218   Recent Labs    12/22/20 0409 12/23/20 0530  NA 137 137  K 4.9 4.6  CL 99 102  CO2 29 28  GLUCOSE 91 93  BUN 24* 24*  CREATININE 0.61 0.60  CALCIUM 9.2 8.9    Intake/Output Summary (Last 24 hours) at 12/24/2020 0753 Last data filed at 12/23/2020 2045 Gross per 24 hour  Intake 596 ml  Output --  Net 596 ml         Physical Exam: Vital Signs Blood pressure 137/87, pulse 85, temperature 99.5 F (37.5 C), temperature source Oral, resp. rate 19, height 5\' 3"  (1.6 m), weight 48.8 kg, SpO2 97 %.  Constitutional: No distress . Vital signs reviewed. HEENT: NCAT, EOMI, oral membranes moist Neck: supple Cardiovascular: RRR without murmur. No JVD    Respiratory/Chest: CTA Bilaterally without wheezes or rales. Normal effort    GI/Abdomen: BS +, non-tender, non-distended Ext: no clubbing, cyanosis, or edema Psych: in reasonable spirits Skin: old coccyx wound/scar Neuro:  pt is alert. Makes eye contact.  . Exp>rec word finding deficits, apraxia--still non-verbal. Right central 7. Poor oral-motor control. Does handle secretions. RUE and RLE 0/5, flaccid. Did not withdraw to pinch on right. Moved left side spontaneously. DTR's 2+ RLE with 2 bts clonus Musculoskeletal:  no pain with PROM.     Assessment/Plan: 1. Functional deficits which require 3+ hours per day of interdisciplinary therapy in a comprehensive inpatient rehab setting. Physiatrist is providing close team supervision and 24 hour management of active medical problems listed below. Physiatrist and rehab team continue to assess barriers to discharge/monitor patient progress toward functional and medical  goals  Care Tool:  Bathing    Body parts bathed by patient: Front perineal area, Abdomen, Chest, Face   Body parts bathed by helper: Right arm, Left arm, Buttocks, Right upper leg, Left lower leg, Right lower leg, Left upper leg     Bathing assist Assist Level: Total Assistance - Patient < 25%     Upper Body Dressing/Undressing Upper body dressing        Upper body assist Assist Level: Total Assistance - Patient < 25%    Lower Body Dressing/Undressing Lower body dressing      What is the patient wearing?: Pants     Lower body assist Assist for lower body dressing: Dependent - Patient 0%     Toileting Toileting    Toileting assist Assist for toileting: Dependent - Patient 0%     Transfers Chair/bed transfer  Transfers assist     Chair/bed transfer assist level: Maximal Assistance - Patient 25 - 49%     Locomotion Ambulation   Ambulation assist      Assist level: 2 helpers Assistive device: Hand held assist (L hall rail) Max distance: 20'   Walk 10 feet activity   Assist     Assist level: 2 helpers Assistive device: Hand  held assist (L hall rail)   Walk 50 feet activity   Assist Walk 50 feet with 2 turns activity did not occur: Safety/medical concerns         Walk 150 feet activity   Assist Walk 150 feet activity did not occur: Safety/medical concerns         Walk 10 feet on uneven surface  activity   Assist Walk 10 feet on uneven surfaces activity did not occur: Safety/medical concerns         Wheelchair     Assist Is the patient using a wheelchair?: Yes Type of Wheelchair: Manual    Wheelchair assist level: Dependent - Patient 0% Max wheelchair distance: 150'    Wheelchair 50 feet with 2 turns activity    Assist        Assist Level: Dependent - Patient 0%   Wheelchair 150 feet activity     Assist      Assist Level: Dependent - Patient 0%   Blood pressure 137/87, pulse 85, temperature 99.5  F (37.5 C), temperature source Oral, resp. rate 19, height 5\' 3"  (1.6 m), weight 48.8 kg, SpO2 97 %. Medical Problem List and Plan: 1.  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  Antithrombotics: -DVT/anticoagulation: [employer]: Other (comment)             -antiplatelet therapy: N/A 3. Pain Management: Tylenol as needed 4. Mood: Wellbutrin 75 mg twice daily             -antipsychotic agents: N/A 5. Neuropsych: This patient is not capable of making decisions on her own behalf. 6. Skin/Wound Care: Routine skin checks 7. Fluids/Electrolytes/Nutrition: encourage PO  -BUN sl elevated. Added FW thru tube  -protein for low albumin   -recheck labs Monday  -ate 15-50% meals 9/10, receiving TF after meal if not eating 50% or more 8.  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 12/03/2020-  -MBS demonstrated improvement in oropharyngeal function with apraxia  -D1 nectars initiated 9/9, meds via PEG 9.  Atrial fibrillation.  Eliquis.  Amiodarone 200 mg daily, Lopressor 12.5 mg twice daily.  Cardiac rate controlled at present 10.  Hyperlipidemia.  Zetia 11.  COPD.Revefenscin nebulizer daily, Brovana as well as Pulmicort 12.  Acute on chronic diastolic congestive heart failure.     Filed Weights   12/22/20 0439 12/23/20 0500 12/24/20 0446  Weight: 49.8 kg 48.9 kg 48.8 kg    -weights generally stable- continue to track 13. HTN:   controlled except for occ DBP elevation  -no changes       LOS: 3 days A FACE TO FACE EVALUATION WAS PERFORMED  02/23/21 12/24/2020, 7:53 AM

## 2020-12-25 DIAGNOSIS — I63512 Cerebral infarction due to unspecified occlusion or stenosis of left middle cerebral artery: Secondary | ICD-10-CM | POA: Diagnosis not present

## 2020-12-25 LAB — BASIC METABOLIC PANEL
Anion gap: 9 (ref 5–15)
BUN: 29 mg/dL — ABNORMAL HIGH (ref 8–23)
CO2: 30 mmol/L (ref 22–32)
Calcium: 9.1 mg/dL (ref 8.9–10.3)
Chloride: 100 mmol/L (ref 98–111)
Creatinine, Ser: 0.66 mg/dL (ref 0.44–1.00)
GFR, Estimated: >60 mL/min (ref >60)
Glucose, Bld: 91 mg/dL (ref 70–99)
Potassium: 4.6 mmol/L (ref 3.5–5.1)
Sodium: 139 mmol/L (ref 135–145)

## 2020-12-25 LAB — GLUCOSE, CAPILLARY
Glucose-Capillary: 138 mg/dL — ABNORMAL HIGH (ref 70–99)
Glucose-Capillary: 140 mg/dL — ABNORMAL HIGH (ref 70–99)
Glucose-Capillary: 77 mg/dL (ref 70–99)
Glucose-Capillary: 77 mg/dL (ref 70–99)

## 2020-12-25 MED ORDER — FREE WATER
300.0000 mL | Freq: Four times a day (QID) | Status: DC
  Administered 2020-12-25 – 2021-01-09 (×60): 300 mL

## 2020-12-25 MED ORDER — POLYETHYLENE GLYCOL 3350 17 G PO PACK
17.0000 g | PACK | Freq: Every day | ORAL | Status: DC
  Administered 2020-12-25 – 2020-12-29 (×5): 17 g via ORAL
  Filled 2020-12-25 (×5): qty 1

## 2020-12-25 NOTE — Discharge Summary (Signed)
[facility] Discharge Summary   Patient: Beverly Li ZOX:[id number]  PCP: Clayborn Heronankins, Victoria R, MD  Date of admission: 12/12/2020   Date of discharge: 12/21/2020     Discharge Diagnoses:  Principal Problem:   Sepsis due to pneumonia [facility](HCC) Active Problems:   Acute respiratory failure with hypoxia (HCC)   Paroxysmal atrial fibrillation (HCC)   Essential hypertension   Acute on chronic diastolic CHF (congestive heart failure) (HCC)   COPD (chronic obstructive pulmonary disease) (HCC)   Dysphagia   GERD (gastroesophageal reflux disease)  Admitted From: home Disposition:  CIR   Recommendations for Outpatient Follow-up:  PCP: follow up with PCP Follow up LABS/TEST:  repeat CXR in 3 week   Follow-up Information     Rankins, Fanny DanceVictoria R, MD. Schedule an appointment as soon as possible for a visit in 1 week(s).   Specialty: Family Medicine Why: repeat Chest x ray in 3 weeks. Contact information: [address] [place] [address] [phone]                Discharge Instructions     Diet - low sodium heart healthy   Complete by: As directed    Discharge wound care:   Complete by: As directed    Foam dressing   Increase activity slowly   Complete by: As directed        Diet recommendation: Cardiac diet  Activity: The patient is advised to gradually reintroduce usual activities, as tolerated  Discharge Condition: stable  Code Status: Full code   History of present illness: As per the H and P dictated on admission, "Beverly MeekerFrances B Matherly is a 75 y.o. 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  Patient is nonverbal following her stroke and her husband provides history.  When her husband left her yesterday she had  been doing well and had been working with speech therapy and taking sips of sugar water.  However, once he returned this morning noted that the patient was having labored breathing.  She had left the hospital on 1 L oxygen, but she seemed to be struggling to breathe for which he requested staff to increase her oxygen.  They have been keeping the head of her bed elevated at 30 degrees to decrease risks of aspiration.  She had not been on any other oral foods and had been receiving all nutrition through her PEG tube.  Husband notes that the patient was given Eliquis prior to leaving the facility today.  He relates her Eliquis being held when she had a hematoma develop on her side as the cause of her having a stroke and notes that he does not want her to miss any doses.   ED Course: Upon admission into the emergency department patient was seen to be febrile up to 101.3 F, respirations 20-25, heart rates 10 5-1 50, and O2 saturation currently maintained on 4 L nasal cannula oxygen.  Labs significant for WBC 19, BUN 34, creatinine 0.68, high-sensitivity troponin 173, and lactic acid 1.1->1.Chest x-ray noted increased right pleural effusion with worsening opacity in the right lung base with unchanged left pleural effusion.  Patient had been started on empiric antibiotics of Rocephin and given Tylenol 650 mg rectally."  Hospital Course:  Summary of her active problems in the hospital is as following. Aspiration pneumonia  Sepsis, poa  -Multiple antibiotic allergies present, clinically improved on IV ceftriaxone and Flagyl and then transition to cefdinir via PEG, completed antibiotic course -SLP following, continues to make progress, plan for MBS -Blood cultures are negative -PT OT following, CIR recommended   Acute respiratory failure with hypoxia  -COPD, without exacerbation:  -Previously discharged to SNF on 1 L O2, currently on 3 L  -Chest x-ray and previous imaging noted severe emphysema, admitted with  aspiration pneumonia  -As above, wean off O2 as tolerated   Paroxysmal atrial fibrillation  -Continue Eliquis, metoprolol, and amiodarone   Diastolic CHF: Acute on chronic.   - noted to have worsening right-sided pleural effusion.  BNP elevated at 970.9.  Last echocardiogram revealed EF of 60 - 65%.   -Require diuretics last admission as well, given IV Lasix x1 on admission -Was started on p.o. Lasix, blood pressures low diuretics held, can resume as needed Will need a repeat CXR in 3 weeks.    Essential hypertension:  -Blood pressure is soft earlier this admission, hydralazine held,  -continue metoprolol 12.5 mg twice daily -Continue to trend   Recent left MCA, PCA stroke with right hemiplegia, aphasia, dysphagia  -Patient had been receiving therapies at [facility] after left MCA stroke status post thrombectomy. -PT/OT/speech following, hopeful for CIR rehabilitation   Abnormal UA and Cultures -unable to assess symptoms, improved back to baseline on antibiotics for aspiration pneumonia, I think this is a colonization   Dysphagia: Secondary to recent stroke PEG placed on 8/19. -Continue tube feeds with free water flushes -Ongoing therapy per SLP   Anxiety -Continue Wellbutrin   Right femoral popliteal bypass graft stenosis -Patient previously recommended to follow-up with vascular surgery in 2 to 3 months   GERD -Continue protonix  Body mass index is 19.84 kg/m.  Nutrition Problem: Severe Malnutrition Etiology: chronic illness Nutrition Interventions: Interventions: Tube feeding  Patient was seen by physical therapy, who recommended CIR. On the day of the discharge the patient's vitals were stable, and no other new acute medical condition were reported. The patient was felt safe to be discharge at [facility] with Therapy.  Consultants: none Procedures: none  DISCHARGE MEDICATION: Allergies as of 12/21/2020       Reactions   Amoxicillin Other (See Comments)   UTI Has  patient had a PCN reaction causing immediate rash, facial/tongue/throat swelling, SOB or lightheadedness with hypotension: No Has patient had a PCN reaction causing severe rash involving mucus membranes or skin necrosis: No Has patient had a PCN reaction that required hospitalization: No Has patient had a PCN reaction occurring within the last 10 years: Yes--UTI ONLY If all of the above answers are "NO", then may proceed with Cephalosporin use.   Atenolol Cough   Crestor [rosuvastatin Calcium] Other (See Comments)   Muscle aches   Pravastatin Other (See Comments)   Muscle aches   Sulfa Antibiotics Nausea Only   Codeine    hallucinations        Medication List     STOP taking these medications    hydrALAZINE 50 MG tablet Commonly known as: APRESOLINE       TAKE these medications    acetaminophen 160 MG/5ML solution Commonly known as: TYLENOL Place 20.3 mLs (650 mg total) into feeding tube every 4 (four) hours as needed for mild pain (or temp > 37.5 C (99.5 F)).   albuterol (2.5 MG/3ML) 0.083% nebulizer solution Commonly known as: PROVENTIL Take 3 mLs (2.5 mg total) by nebulization every 4 (four)  hours as needed for wheezing or shortness of breath.   amiodarone 200 MG tablet Commonly known as: PACERONE Place 1 tablet (200 mg total) into feeding tube daily.   apixaban 5 MG Tabs tablet Commonly known as: ELIQUIS Place 1 tablet (5 mg total) into feeding tube 2 (two) times daily.   arformoterol 15 MCG/2ML Nebu Commonly known as: BROVANA Take 2 mLs (15 mcg total) by nebulization 2 (two) times daily.   budesonide 0.5 MG/2ML nebulizer solution Commonly known as: PULMICORT Take 2 mLs (0.5 mg total) by nebulization 2 (two) times daily.   buPROPion 75 MG tablet Commonly known as: WELLBUTRIN Place 1 tablet (75 mg total) into feeding tube 2 (two) times daily.   ezetimibe 10 MG tablet Commonly known as: ZETIA Place 1 tablet (10 mg total) into feeding tube daily.    feeding supplement (PROSource TF) liquid Place 45 mLs into feeding tube 2 (two) times daily.   feeding supplement (JEVITY 1.2 CAL) Liqd Place 55 mL/hr into feeding tube continuous.   free water Soln Place 200 mLs into feeding tube every 4 (four) hours.   metoprolol tartrate 25 MG tablet Commonly known as: LOPRESSOR Place 0.5 tablets (12.5 mg total) into feeding tube 2 (two) times daily.   NovoLOG FlexPen 100 UNIT/ML FlexPen Generic drug: insulin aspart Inject 1-9 Units into the skin See admin instructions. 70-120=0 units, 121-150=1 unit, 151-200=2 units, 201-250=3 units, 251-300=5 units, 301-350=7 units, 351-400=9 units greater than 400 call MD   pantoprazole sodium 40 mg Pack Commonly known as: PROTONIX Place 20 mLs (40 mg total) into feeding tube at bedtime.   revefenacin 175 MCG/3ML nebulizer solution Commonly known as: YUPELRI Take 3 mLs (175 mcg total) by nebulization daily.               Discharge Care Instructions  (From admission, onward)           Start     Ordered   12/21/20 0000  Discharge wound care:       Comments: Foam dressing   12/21/20 1209            Discharge Exam: Filed Weights   12/19/20 0600 12/20/20 0413 12/21/20 0316  Weight: 50.6 kg 50.7 kg 50.8 kg   Vitals:   12/21/20 1020 12/21/20 1436  BP: 114/75   Pulse: 93 79  Resp:  17  Temp:  97.8 F (36.6 C)  SpO2:     General: Appear in mild distress, no Rash; Oral Mucosa Clear, moist. no Abnormal Neck Mass Or lumps, Conjunctiva normal  Cardiovascular: S1 and S2 Present, no Murmur, Respiratory: normal  respiratory effort, Bilateral Air entry present and bilateral  Crackles, no wheezes Abdomen: Bowel Sound present, Soft and no tenderness Extremities: trace Pedal edema Neurology: alert and oriented to time, place, and person affect appropriate. no new focal deficit Gait not checked due to patient safety concerns   The results of significant diagnostics from this  hospitalization (including imaging, microbiology, ancillary and laboratory) are listed below for reference.    Significant Diagnostic Studies: CT CHEST W CONTRAST  Result Date: 12/05/2020 CLINICAL DATA:  Acute hypoxic respiratory failure, abnormal chest x-ray. EXAM: CT CHEST WITH CONTRAST TECHNIQUE: Multidetector CT imaging of the chest was performed during intravenous contrast administration. CONTRAST:  50mL OMNIPAQUE IOHEXOL 350 MG/ML SOLN COMPARISON:  None. FINDINGS: Cardiovascular: There is extensive multi-vessel coronary artery calcification. There is mild to moderate cardiomegaly with left ventricular hypertrophy. Hypertrophy of the left ventricle asymmetrically involving the ventricular septum results in  marked narrowing of the left ventricular outflow tract. This is best appreciated on axial image # 96/3 and sagittal image # 100/7. No pericardial effusion. There is marked enlargement of the a central pulmonary arteries in keeping with changes of pulmonary arterial hypertension. There is adequate opacification of the pulmonary arterial tree and no intraluminal filling defect is identified to suggest acute pulmonary embolism. Moderate atherosclerotic calcification noted within the thoracic aorta. The thoracic aorta is dilated measuring 4.3 cm in diameter in its ascending segment, 3.5 cm in diameter at the arch, and 2.5 cm in diameter at the level of the left atrium. Mediastinum/Nodes: The visualized thyroid is unremarkable. No pathologic thoracic adenopathy. The esophagus is unremarkable. Lungs/Pleura: Severe emphysema. Small bilateral pleural effusions have developed with bibasilar compressive atelectasis. No superimposed focal pulmonary infiltrate. No pneumothorax. Central airways are widely patent. Upper Abdomen: No acute abnormality. Musculoskeletal: No acute bone abnormality. No lytic or blastic bone lesion identified. IMPRESSION: Severe emphysema. Small bilateral pleural effusions with bibasilar  compressive atelectasis. Extensive multi-vessel coronary artery calcification. Cardiomegaly with asymmetric septal hypertrophy resulting in impingement of the left ventricular outflow tract. Correlation with echocardiography may be helpful for further evaluation. Morphologic changes in keeping with pulmonary arterial hypertension. Dilation of the a thoracic aorta measuring 4.3 cm in greatest dimension within the ascending segment. Recommend annual imaging followup by CTA or MRA. This recommendation follows 2010 ACCF/AHA/AATS/ACR/ASA/SCA/SCAI/SIR/STS/SVM Guidelines for the Diagnosis and Management of Patients with Thoracic Aortic Disease. Circulation. 2010; 121: B147-W295. Aortic aneurysm NOS (ICD10-I71.9) Aortic Atherosclerosis (ICD10-I70.0) and Emphysema (ICD10-J43.9). Aortic aneurysm NOS (ICD10-I71.9). Electronically Signed   By: Helyn Numbers M.D.   On: 12/05/2020 00:49   CT ANGIO AO+BIFEM W & OR WO CONTRAST  Result Date: 11/26/2020 CLINICAL DATA:  75 year old female presenting 11/13/2020 with code stroke, status post left common femoral artery access and treatment of left MCA EXAM: CT ANGIOGRAPHY OF ABDOMINAL AORTA WITH ILIOFEMORAL RUNOFF TECHNIQUE: Multidetector CT imaging of the abdomen, pelvis and lower extremities was performed using the standard protocol during bolus administration of intravenous contrast. Multiplanar CT image reconstructions and MIPs were obtained to evaluate the vascular anatomy. CONTRAST:  OMNIPAQUE IOHEXOL 350 MG/ML SOLN COMPARISON:  None. FINDINGS: VASCULAR Aorta: Advanced atherosclerotic changes of the abdominal aorta. No dissection. No periaortic fluid. Renals: - Right: Right renal origin not imaged though is patent. - Left: Left renal artery with atherosclerotic changes at the origin and remains patent. IMA: Inferior mesenteric artery is patent. Right lower extremity: Moderate to advanced atherosclerotic changes throughout the right iliac system including CIA, hypogastric  artery, EIA. No high-grade stenosis identified. Hypogastric remains patent though with moderate to advanced disease. Surgical changes at the right common femoral artery, with ectasias a and prior femoropopliteal bypass. Profunda femoris and the thigh branches patent. The proximal femoropopliteal bypass is patent. There is decreased contrast opacification in the distal third, and particularly at the distal anastomosis, with a nondiagnostic study at the popliteal region. No contrast visualized within the tibial arteries. Native SFA occlusion. Left lower extremity: Moderate to advanced atherosclerotic changes of the left iliac system, including the CIA, hypogastric artery, EIA. No high-grade stenosis identified within the iliac segment. Questionable focal 50% stenosis of the left EIA secondary to mixed calcified and soft plaque. There is likely 50% stenosis of the left hypogastric artery just beyond the origin. Ectasias of the left common femoral artery, may be related to prior endarterectomy or native ectasias. There is developing stenosis of the common femoral artery, with presumed high-grade stenosis. Profunda femoris  and the thigh branches are patent. Native left SFA occlusion, which was evident on the final limited angiogram of 11/13/2020. 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 Reconstitution of the popliteal artery via collateral flow. No significant calcifications of the tibial arteries. The origin of all 3 tibial arteries is patent. There is decreased contrast within the distal anterior tibial artery and peroneal artery, with contrast throughout the length of the posterior tibial artery to the ankle. Veins: Unremarkable appearance of the venous system. Review of the MIP images confirms the above findings. NON-VASCULAR Visualized liver and gallbladder unremarkable. Visualized pancreas unremarkable. Adrenals/Urinary Tract: Visualized aspects of the adrenal glands unremarkable. Left kidney incompletely  imaged.  No evidence of hydronephrosis. Right kidney without hydronephrosis or perinephric stranding. No evidence of nephrolithiasis. Unremarkable urinary bladder. Stomach/Bowel: Visualized small bowel unremarkable with no abnormal distension. No air-fluid levels or transition identified. Visualized colon without transition point or focal inflammatory changes. Diverticula of the left colon. No significant stool burden. Lymphatic: No adenopathy. Mesenteric: No mesenteric lymphadenopathy.  No free fluid. Reproductive: Unremarkable uterus Other: Fluid collection within the left-sided abdominal wall musculature, compatible with electronic record history of prior spontaneous hematoma. The greatest axial dimension is approximately 7.7 cm. No evidence of abdominal hernia. Musculoskeletal: Incompletely imaged surgical changes of the lumbar region. No acute displaced fracture. Osteopenia. Degenerative changes of the hips. IMPRESSION: CT angiogram is suspicious for, though not diagnostic of, occlusion of distal right femoropopliteal bypass. Low cardiac output and/or distal high-grade stenosis/resistance could also contribute to un-opacified distal bypass conduit. High-grade stenosis of the left common femoral artery, with associated atherosclerotic changes and ectasia. Additionally, there is native left SFA occlusion with reconstitution of the popliteal artery via profunda femoris collateral flow. Absence of right-sided tibial arterial opacification. On the left, the posterior tibial artery is patent to the ankle, with the distal left AT and peroneal artery unopacified, potentially secondary to late arrival of the contrast versus occlusion. Moderate to advanced aortic atherosclerosis and bilateral iliac arterial disease, with no evidence of high-grade aorta iliac disease. Questionable 50% narrowing in the proximal left EIA and hypogastric artery. Aortic Atherosclerosis (ICD10-I70.0). Additional ancillary findings as above.  Signed, Yvone Neu. Reyne Dumas, RPVI [facility] Electronically Signed   By: Gilmer Mor D.O.   On: 11/26/2020 10:22   DG Chest Portable 1 View  Result Date: 12/12/2020 CLINICAL DATA:  Dyspnea, respiratory distress EXAM: PORTABLE CHEST 1 VIEW COMPARISON:  Chest radiograph 12/01/2020 FINDINGS: The cardiomediastinal silhouette is stable. There is confluent opacity in the right base which has increased in conspicuity since the prior study. Retrocardiac consolidation is similar to the prior study. There are bilateral pleural effusions; hazy opacity in the right lower lung likely reflects layering fluid. The effusion appears increased in size on the right. The upper lungs remain well aerated. There is no pneumothorax. The bones are stable. IMPRESSION: 1. Increased right pleural effusion with worsening opacity in the right base; ongoing/worsening infection can not be excluded. 2. Overall unchanged left pleural effusion with adjacent consolidation which may reflect infection or atelectasis. Electronically Signed   By: Lesia Hausen M.D.   On: 12/12/2020 12:35   DG CHEST PORT 1 VIEW  Result Date: 12/01/2020 CLINICAL DATA:  Shortness of breath EXAM: PORTABLE CHEST 1 VIEW COMPARISON:  11/25/2020 FINDINGS: Single frontal view of the chest demonstrates stable enlargement of the cardiac silhouette. The enteric catheter seen previously has been removed in the interim. There are bibasilar veiling opacities consistent with  consolidation and small effusions, increased since prior exam. No evidence of pneumothorax. No acute bony abnormalities. IMPRESSION: 1. Bibasilar veiling opacities, slightly increased since prior study, consistent with underlying consolidation and/or effusions. Electronically Signed   By: Sharlet Salina M.D.   On: 12/01/2020 18:13   DG Swallowing Func-Speech Pathology  Result Date: 12/21/2020 Table formatting from the original result was not  included. Objective Swallowing Evaluation: Type of Study: MBS-Modified Barium Swallow Study  Patient Details Name: AYOMIDE ZULETA MRN: [id number] Date of Birth: [birth year] Today's Date: 12/21/2020 Time: SLP Start Time (ACUTE ONLY): 1250 -SLP Stop Time (ACUTE ONLY): 1310 SLP Time Calculation (min) (ACUTE ONLY): 20 min Past Medical History: Past Medical History: Diagnosis Date  Anxiety   Arthritis   "some in my lower back; probably elbows, knees" (11/18/2017)  Atrial fibrillation (HCC)   Bell's palsy   when pt. was 75 yrs old, when under stress the left side of face will droop.  Complication of anesthesia   "vascular OR 2016; BP bottomed out; couldn't get it regulated; ended up in ICU for DAYS" (11/18/2017)  GERD (gastroesophageal reflux disease)   History of kidney stones   Hypertension   Hypertrophic cardiomyopathy (HCC)   severe LV basilar hypertrophy witn no evidence of significant outflow tract obstruction, EF 65-70%, mild LAE, mild TR, grade 1a diastolic dysfunction 05/15/10 (Dr. Donato Schultz) (Atrial Septal Hypertrophy pattern)-- Intra-op TEE with dsignificant outflow tract obstruction - AI, MR & TR  Insomnia   Mild aortic sclerosis   Osteopenia   Peripheral vascular disease (HCC)   Syncope   , Vagal Past Surgical History: Past Surgical History: Procedure Laterality Date  AUGMENTATION MAMMAPLASTY Bilateral   BACK SURGERY    CARDIAC CATHETERIZATION N/A 05/07/2016  Procedure: Left Heart Cath and Coronary Angiography;  Surgeon: Kathleene Hazel, MD;  Location: [facility] INVASIVE CV LAB;  Service: Cardiovascular;  Laterality: N/A;  CARDIOVERSION N/A 09/24/2017  Procedure: CARDIOVERSION;  Surgeon: Laurey Morale, MD;  Location: [facility] ENDOSCOPY;  Service: Cardiovascular;  Laterality: N/A;  DILATION AND CURETTAGE OF UTERUS    ENDARTERECTOMY FEMORAL Right 03/02/2015  Procedure: ENDARTERECTOMY RIGHT FEMORAL;  Surgeon: Chuck Hint, MD;  Location[facility] OR;  Service: Vascular;  Laterality: Right;  ESOPHAGOGASTRODUODENOSCOPY  (EGD) WITH PROPOFOL N/A 12/01/2020  Procedure: ESOPHAGOGASTRODUODENOSCOPY (EGD) WITH PROPOFOL;  Surgeon: Diamantina Monks, MD;  Location: [facility] ENDOSCOPY;  Service: General;  Laterality: N/A;  FACIAL COSMETIC SURGERY Left 2002  "related to Bell's Palsy @ age 72; left eye/side of face droopy; tried to make area symmetrical"  FEMORAL-POPLITEAL BYPASS GRAFT Right 03/02/2015  Procedure: BYPASS GRAFT FEMORAL-BELOW KNEE POPLITEAL ARTERY;  Surgeon: Chuck Hint, MD;  Location: [facility] OR;  Service: Vascular;  Laterality: Right;  INGUINAL HERNIA REPAIR Bilateral 2002  IR ANGIO INTRA EXTRACRAN SEL COM CAROTID INNOMINATE UNI L MOD SED  11/16/2020  IR CT HEAD LTD  11/13/2020  IR PERCUTANEOUS ART THROMBECTOMY/INFUSION INTRACRANIAL INC DIAG ANGIO  11/13/2020  OVARIAN CYST REMOVAL Left   PEG PLACEMENT N/A 12/01/2020  Procedure: PERCUTANEOUS ENDOSCOPIC GASTROSTOMY (PEG) PLACEMENT;  Surgeon: Diamantina Monks, MD;  Location: [facility] ENDOSCOPY;  Service: General;  Laterality: N/A;  PERIPHERAL VASCULAR CATHETERIZATION N/A 01/16/2015  Procedure: Abdominal Aortogram;  Surgeon: Chuck Hint, MD;  Location: [facility] INVASIVE CV LAB;  Service: Cardiovascular;  Laterality: N/A;  POSTERIOR LUMBAR FUSION  2015  "have plates and screws in there"  RADIOLOGY WITH ANESTHESIA N/A 11/13/2020  Procedure: IR WITH ANESTHESIA;  Surgeon: Radiologist, Medication, MD;  Location: [facility] OR;  Service: Radiology;  Laterality: N/A;  TONSILLECTOMY   HPI: Pt is a 75 y.o. female recently d/c to [name] for rehab after admission 11/13/20-12/06/20 for multiple infarcts requiring revascularization and intubation, now readmitted 12/12/20 with respiratory distress. CXR noted increased R pleural effusion, unchanged L pleural effusion. Workup for sepsis secondary to PNA; question possibility of bacterial pneumonia versus aspiration with possibility of compounding factor of CHF exacerbation. PMH includes multiple infarcts 11/2020 (L MCA and ACA territory infarcts, scattered punctate  infarcts R frontoparietal cortex, R cerebellum), COPD, afib, PAD, HTN, cardiomyopathy, aortic stenosis, Bell's palsy, anxiety, osteopenia. MBS 8/24 with dx of severe primarily oral dysphagia with adequate ability to protect airway with nectar-thick and puree - recommended therapeutic trials of puree/teaspoons of nectar with SLP>  Subjective: alert Assessment / Plan / Recommendation [facility] IP CLINICAL IMPRESSIONS 12/21/2020 Clinical Impression Pt presents with overall improved oropharyngeal function since 8/24 exam. She demonstrates improved awareness and intentional manipulation of solids and liquids.  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  Thin liquids spilled prematurely into pharynx, and on one instance there was trace aspiration - accompanied by a strong cough. Subsequent thin liquid boluses were swallowed with laryngeal vestibule closure occurring in time to protect the airway.  There was good pharyngeal stripping with no post-swallow residue with any consistency.  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  Results of exam/video were reviewed with pt and her husband and with [name], the SLP who will be working with pt on CIR.  Recommend initiating a dysphagia 1 (pureed) diet with nectar thick liquids.  Pt may have sips of thin liquid outside of meals.  Give meds via PEG.  Dietitian to follow re: adjusting tube feedings. SLP should be able to advance diet based on clinical performance without repeating MBS.  SLP Visit Diagnosis Dysphagia, oropharyngeal phase (R13.12) Attention and concentration deficit following -- Frontal lobe and executive function deficit following -- Impact on safety and function Mild aspiration risk   CHL IP TREATMENT RECOMMENDATION 12/21/2020 Treatment Recommendations Defer treatment plan to f/u with SLP   Prognosis 12/13/2020  Prognosis for Safe Diet Advancement Guarded Barriers to Reach Goals Language deficits Barriers/Prognosis Comment -- CHL IP DIET RECOMMENDATION 12/21/2020 SLP Diet Recommendations Dysphagia 1 (Puree) solids;Nectar thick liquid Liquid Administration via Cup Medication Administration Via alternative means Compensations Minimize environmental distractions Postural Changes --   CHL IP OTHER RECOMMENDATIONS 12/21/2020 Recommended Consults -- Oral Care Recommendations Oral care BID Other Recommendations Have oral suction available   CHL IP FOLLOW UP RECOMMENDATIONS 12/21/2020 Follow up Recommendations Inpatient Rehab   CHL IP FREQUENCY AND DURATION 12/14/2020 Speech Therapy Frequency (ACUTE ONLY) min 2x/week Treatment Duration --      CHL IP ORAL PHASE 12/21/2020 Oral Phase Impaired Oral - Pudding Teaspoon -- Oral - Pudding Cup -- Oral - Honey Teaspoon -- Oral - Honey Cup -- Oral - Nectar Teaspoon -- Oral - Nectar Cup -- Oral - Nectar Straw -- Oral - Thin Teaspoon -- Oral - Thin Cup -- Oral - Thin Straw -- Oral - Puree -- Oral - Mech Soft -- Oral - Regular -- Oral - Multi-Consistency -- Oral - Pill -- Oral Phase - Comment --  CHL IP PHARYNGEAL PHASE 12/21/2020 Pharyngeal Phase Impaired Pharyngeal- Pudding Teaspoon -- Pharyngeal -- Pharyngeal- Pudding Cup -- Pharyngeal -- Pharyngeal- Honey Teaspoon -- Pharyngeal -- Pharyngeal- Honey Cup --  Pharyngeal -- Pharyngeal- Nectar Teaspoon Delayed swallow initiation-vallecula;Delayed swallow initiation-pyriform sinuses Pharyngeal Material enters airway, remains ABOVE vocal cords then ejected out Pharyngeal- Nectar Cup Delayed swallow initiation-vallecula;Delayed swallow initiation-pyriform sinuses Pharyngeal Material enters airway, remains ABOVE vocal cords then ejected out Pharyngeal- Nectar Straw -- Pharyngeal -- Pharyngeal- Thin Teaspoon -- Pharyngeal -- Pharyngeal- Thin Cup Delayed swallow initiation-pyriform sinuses;Penetration/Aspiration before swallow;Trace aspiration Pharyngeal Material  enters airway, passes BELOW cords and not ejected out despite cough attempt by patient Pharyngeal- Thin Straw -- Pharyngeal -- Pharyngeal- Puree Delayed swallow initiation-vallecula;Delayed swallow initiation-pyriform sinuses Pharyngeal -- Pharyngeal- Mechanical Soft -- Pharyngeal -- Pharyngeal- Regular Delayed swallow initiation-vallecula;Delayed swallow initiation-pyriform sinuses Pharyngeal -- Pharyngeal- Multi-consistency -- Pharyngeal -- Pharyngeal- Pill -- Pharyngeal -- Pharyngeal Comment --  No flowsheet data found. [name] 12/21/2020, 2:08 PM              DG Swallowing Func-Speech Pathology  Result Date: 12/06/2020 Table formatting from the original result was not included. Objective Swallowing Evaluation: Type of Study: MBS-Modified Barium Swallow Study  Patient Details Name: TYRONDA VIZCARRONDO MRN: [id number] Date of Birth: 1946/01/17 Today's Date: 12/06/2020 Time: SLP Start Time (ACUTE ONLY): 0930 -SLP Stop Time (ACUTE ONLY): 0955 SLP Time Calculation (min) (ACUTE ONLY): 25 min Past Medical History: Past Medical History: Diagnosis Date  Anxiety   Arthritis   "some in my lower back; probably elbows, knees" (11/18/2017)  Atrial fibrillation (HCC)   Bell's palsy   when pt. was 75 yrs old, when under stress the left side of face will droop.  Complication of anesthesia   "vascular OR 2016; BP bottomed out; couldn't get it regulated; ended up in ICU for DAYS" (11/18/2017)  GERD (gastroesophageal reflux disease)   History of kidney stones   Hypertension   Hypertrophic cardiomyopathy (HCC)   severe LV basilar hypertrophy witn no evidence of significant outflow tract obstruction, EF 65-70%, mild LAE, mild TR, grade 1a diastolic dysfunction 05/15/10 (Dr. Donato Schultz) (Atrial Septal Hypertrophy pattern)-- Intra-op TEE with dsignificant outflow tract obstruction - AI, MR & TR  Insomnia   Mild aortic sclerosis   Osteopenia   Peripheral vascular disease (HCC)   Syncope   , Vagal Past Surgical History: Past  Surgical History: Procedure Laterality Date  AUGMENTATION MAMMAPLASTY Bilateral   BACK SURGERY    CARDIAC CATHETERIZATION N/A 05/07/2016  Procedure: Left Heart Cath and Coronary Angiography;  Surgeon: Kathleene Hazel, MD;  Location: [facility] INVASIVE CV LAB;  Service: Cardiovascular;  Laterality: N/A;  CARDIOVERSION N/A 09/24/2017  Procedure: CARDIOVERSION;  Surgeon: Laurey Morale, MD;  Location: [facility] ENDOSCOPY;  Service: Cardiovascular;  Laterality: N/A;  DILATION AND CURETTAGE OF UTERUS    ENDARTERECTOMY FEMORAL Right 03/02/2015  Procedure: ENDARTERECTOMY RIGHT FEMORAL;  Surgeon: Chuck Hint, MD;  Location: [facility] OR;  Service: Vascular;  Laterality: Right;  ESOPHAGOGASTRODUODENOSCOPY (EGD) WITH PROPOFOL N/A 12/01/2020  Procedure: ESOPHAGOGASTRODUODENOSCOPY (EGD) WITH PROPOFOL;  Surgeon: Diamantina Monks, MD;  Location: [facility] ENDOSCOPY;  Service: General;  Laterality: N/A;  FACIAL COSMETIC SURGERY Left 2002  "related to Bell's Palsy @ age 55; left eye/side of face droopy; tried to make area symmetrical"  FEMORAL-POPLITEAL BYPASS GRAFT Right 03/02/2015  Procedure: BYPASS GRAFT FEMORAL-BELOW KNEE POPLITEAL ARTERY;  Surgeon: Chuck Hint, MD;  Location: [facility] OR;  Service: Vascular;  Laterality: Right;  INGUINAL HERNIA REPAIR Bilateral 2002  IR ANGIO INTRA EXTRACRAN SEL COM CAROTID INNOMINATE UNI L MOD SED  11/16/2020  IR CT HEAD LTD  11/13/2020  IR PERCUTANEOUS ART THROMBECTOMY/INFUSION INTRACRANIAL INC DIAG  ANGIO  11/13/2020  OVARIAN CYST REMOVAL Left   PEG PLACEMENT N/A 12/01/2020  Procedure: PERCUTANEOUS ENDOSCOPIC GASTROSTOMY (PEG) PLACEMENT;  Surgeon: Diamantina Monks, MD;  Location: [facility] ENDOSCOPY;  Service: General;  Laterality: N/A;  PERIPHERAL VASCULAR CATHETERIZATION N/A 01/16/2015  Procedure: Abdominal Aortogram;  Surgeon: Chuck Hint, MD;  Location: [facility] INVASIVE CV LAB;  Service: Cardiovascular;  Laterality: N/A;  POSTERIOR LUMBAR FUSION  2015  "have plates and screws in there"  RADIOLOGY WITH  ANESTHESIA N/A 11/13/2020  Procedure: IR WITH ANESTHESIA;  Surgeon: Radiologist, Medication, MD;  Location: [facility] OR;  Service: Radiology;  Laterality: N/A;  TONSILLECTOMY   HPI: 75 year old female with proximal A. 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 Intubated 8/1, extubated 8/2. MRI shows Acute infarcts involving the left basal ganglia, left insula, overlying  left frontal lobe and high left parasagittal frontal lobe cortex,  compatible with left ACA and MCA territory infarcts. Small areas of  acute infarct in the left frontal lobe white matter and punctate  left parietal cortical infarct, also left ACA and MCA territories.  Associated edema without mass effect. Additional punctate acute  infarcts in right frontal and parietal cortex. Small acute right  cerebellar infarct. Partial lung collapse 8/7 requiring temporary CPAP.  No data recorded Assessment / Plan / Recommendation CHL IP CLINICAL IMPRESSIONS 12/06/2020 Clinical Impression Pt demonstrates a severe, primarily oral dysphagia. Pt has no voluntary lingual control. When given a teaspoon or cup of nectar thick liquid there is adequate labial closure with only mild anterior spillage on the left with a cup sip. Pt holds bolus on the lingual surface with base of tongue/VP seal. In order to initiate transit pt cannot voluntarily lift tip/body of tongue for propulsion; she can only release the posterior seal and allow liquid/puree to spill to pharynx which eventually triggers lingual and pharyngeal stripping and adequate hyolaryngeal movement. Pt has sensed aspiration before the swallow with nectar thick liquids with a large cup sip bolus, but manages a smaller teaspoon (self fed and total assist) nectar bolus with trace penetration before the swallow at the worst and typically no penetration at all. Pt does very well with extra time with puree and even started to demontrate increased lingual activation by the end of the test. Anticipate that pt will  progress more rapidly if given frequent therapeutic trials of puree and nectar teaspoon. Recommend pt f/u with SNF SLP as she is planning to d/c today. SLP Visit Diagnosis Dysphagia, oropharyngeal phase (R13.12);Aphasia (R47.01) Attention and concentration deficit following -- Frontal lobe and executive function deficit following -- Impact on safety and function Moderate aspiration risk   CHL IP TREATMENT RECOMMENDATION 12/06/2020 Treatment Recommendations Therapy as outlined in treatment plan below   Prognosis 12/06/2020 Prognosis for Safe Diet Advancement Good Barriers to Reach Goals Language deficits;Severity of deficits Barriers/Prognosis Comment -- CHL IP DIET RECOMMENDATION 12/06/2020 SLP Diet Recommendations Alternative means - long-term;Other (Comment) Liquid Administration via Spoon Medication Administration Via alternative means Compensations Slow rate;Small sips/bites;Minimize environmental distractions Postural Changes Remain semi-upright after after feeds/meals (Comment);Seated upright at 90 degrees   CHL IP OTHER RECOMMENDATIONS 12/06/2020 Recommended Consults -- Oral Care Recommendations Oral care QID Other Recommendations Have oral suction available   CHL IP FOLLOW UP RECOMMENDATIONS 12/06/2020 Follow up Recommendations Skilled Nursing facility   [facility] IP FREQUENCY AND DURATION 12/06/2020 Speech Therapy Frequency (ACUTE ONLY) min 2x/week Treatment Duration 2 weeks      CHL IP ORAL PHASE 12/06/2020 Oral Phase Impaired Oral - Pudding Teaspoon --  Oral - Pudding Cup -- Oral - Honey Teaspoon -- Oral - Honey Cup -- Oral - Nectar Teaspoon Delayed oral transit;Lingual/palatal residue;Holding of bolus;Reduced posterior propulsion;Weak lingual manipulation;Right pocketing in lateral sulci Oral - Nectar Cup Delayed oral transit;Lingual/palatal residue;Holding of bolus;Reduced posterior propulsion;Weak lingual manipulation;Right anterior bolus loss;Right pocketing in lateral sulci Oral - Nectar Straw NT Oral - Thin  Teaspoon -- Oral - Thin Cup -- Oral - Thin Straw -- Oral - Puree Holding of bolus;Reduced posterior propulsion;Delayed oral transit Oral - Mech Soft -- Oral - Regular -- Oral - Multi-Consistency -- Oral - Pill -- Oral Phase - Comment --  CHL IP PHARYNGEAL PHASE 12/06/2020 Pharyngeal Phase Impaired Pharyngeal- Pudding Teaspoon -- Pharyngeal -- Pharyngeal- Pudding Cup -- Pharyngeal -- Pharyngeal- Honey Teaspoon -- Pharyngeal -- Pharyngeal- Honey Cup -- Pharyngeal -- Pharyngeal- Nectar Teaspoon Delayed swallow initiation-vallecula;Penetration/Aspiration before swallow Pharyngeal Material enters airway, remains ABOVE vocal cords and not ejected out;Material does not enter airway;Material enters airway, remains ABOVE vocal cords then ejected out Pharyngeal- Nectar Cup Delayed swallow initiation-pyriform sinuses;Penetration/Aspiration before swallow Pharyngeal Material enters airway, passes BELOW cords and not ejected out despite cough attempt by patient Pharyngeal- Nectar Straw -- Pharyngeal -- Pharyngeal- Thin Teaspoon -- Pharyngeal -- Pharyngeal- Thin Cup -- Pharyngeal -- Pharyngeal- Thin Straw -- Pharyngeal -- Pharyngeal- Puree Delayed swallow initiation-vallecula;Delayed swallow initiation-pyriform sinuses Pharyngeal -- Pharyngeal- Mechanical Soft -- Pharyngeal -- Pharyngeal- Regular -- Pharyngeal -- Pharyngeal- Multi-consistency -- Pharyngeal -- Pharyngeal- Pill -- Pharyngeal -- Pharyngeal Comment --  No flowsheet data found. DeBlois, Riley Nearing 12/06/2020, 10:03 AM              EEG adult  Result Date: 11/30/2020 [name], MD     11/30/2020  3:39 PM Patient Name: ARLINE KETTER MRN: [id number] Epilepsy Attending: [name] Referring Physician/Provider: [name] Date: 11/30/2020 Duration: 21.29 mins Patient history: 17 old female with left ACA/MCA infarct.  Had a seizure-like episode lasting about 30 seconds.  EEG to evaluate for seizures. Level of alertness: Awake AEDs during EEG study:  None Technical aspects: This EEG study was done with scalp electrodes positioned according to the 10-20 International system of electrode placement. Electrical activity was acquired at a sampling rate of 500Hz  and reviewed with a high frequency filter of 70Hz  and a low frequency filter of 1Hz . EEG data were recorded continuously and digitally stored. Description: The posterior dominant rhythm consists of 8 Hz activity of moderate voltage (25-35 uV) seen predominantly in posterior head regions, symmetric and reactive to eye opening and eye closing. EEG showed continuous 5 to 6 Hz theta as well as intermittent 2 to 3 Hz delta slowing in left hemisphere.  Hyperventilation and photic stimulation were not performed.   ABNORMALITY - Continuous slow, left hemisphere IMPRESSION: This study is suggestive of cortical dysfunction in left hemisphere, likely secondary to underlying strokes.  No seizures or definite epileptiform discharges were seen throughout the recording. If suspicion for ictal-interictal activity remains a concern, a prolonged study can be considered. Priyanka O Yadav   VAS LOWER EXTREMITY ARTERIAL DUPLEX  Result Date: 11/26/2020 LOWER EXTREMITY ARTERIAL DUPLEX STUDY Patient Name:  MELYNDA KRZYWICKI Beaupre  Date of Exam:   11/25/2020 Medical Rec #: 11/28/2020        Accession #:    Wyatt Haste Date of Birth: 10-03-1945        Patient Gender: F Patient Age:   22 years Exam Location:  [facility] Procedure:      VAS  [place] LOWER EXTREMITY ARTERIAL DUPLEX Referring Phys: Leticia Penna --------------------------------------------------------------------------------  Indications: Cool, mottled right lower extremity and foot. 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 CTA Head and Neck                showed occlusion of the left MCA and an age indeterminate                occlusion of the proximal right internal carotid  artery in the                neck with non-opacification through the carotid terminus.  Vascular Interventions: Right femoral to below knee popliteal artery bypass                         graft and right femoral endarterectomy 02/2015 Current ABI:            N/A Comparison Study: Prior right LEA done 01/2018. No left LEA on file. Performing Technologist: Sherren Kerns RVS  Examination Guidelines: A complete evaluation includes B-mode imaging, spectral Doppler, color Doppler, and power Doppler as needed of all accessible portions of each vessel. Bilateral testing is considered an integral part of a complete examination. Limited examinations for reoccurring indications may be performed as noted.  +-----------+--------+-----+--------+-----------+---------------+ RIGHT      PSV cm/sRatioStenosisWaveform   Comments        +-----------+--------+-----+--------+-----------+---------------+ CFA Prox   32                                              +-----------+--------+-----+--------+-----------+---------------+ SFA Prox                                   known occlusion +-----------+--------+-----+--------+-----------+---------------+ ATA Prox   66                   multiphasic                +-----------+--------+-----+--------+-----------+---------------+ ATA Mid    49                   multiphasic                +-----------+--------+-----+--------+-----------+---------------+ ATA Distal 45                   multiphasic                +-----------+--------+-----+--------+-----------+---------------+ PTA Prox   45                   multiphasic                +-----------+--------+-----+--------+-----------+---------------+ PTA Mid    44                   multiphasic                +-----------+--------+-----+--------+-----------+---------------+ PTA Distal 53                   multiphasic                 +-----------+--------+-----+--------+-----------+---------------+  PERO Prox  89                   multiphasic                +-----------+--------+-----+--------+-----------+---------------+ PERO Mid   36                   monophasic                 +-----------+--------+-----+--------+-----------+---------------+ PERO Distal35                   monophasic                 +-----------+--------+-----+--------+-----------+---------------+  Right Graft #1: +------------------+--------+--------+---------+--------+                   PSV cm/sStenosisWaveform Comments +------------------+--------+--------+---------+--------+ Inflow                                              +------------------+--------+--------+---------+--------+ Prox Anastomosis                                    +------------------+--------+--------+---------+--------+ Proximal Graft    75              triphasic         +------------------+--------+--------+---------+--------+ Mid Graft                                           +------------------+--------+--------+---------+--------+ Distal Graft      131             triphasic         +------------------+--------+--------+---------+--------+ Distal Anastomosis82              triphasic         +------------------+--------+--------+---------+--------+ Outflow           40              triphasic         +------------------+--------+--------+---------+--------+   +----------+--------+-----+--------+----------+--------------------------------+ LEFT      PSV cm/sRatioStenosisWaveform  Comments                         +----------+--------+-----+--------+----------+--------------------------------+ CFA Prox  100                  monophasic                                 +----------+--------+-----+--------+----------+--------------------------------+ CFA Mid   35                   monophasic                                  +----------+--------+-----+--------+----------+--------------------------------+ DFA       60                   monophasic                                 +----------+--------+-----+--------+----------+--------------------------------+  SFA Prox                       thump                                      +----------+--------+-----+--------+----------+--------------------------------+ SFA Mid                occluded                                           +----------+--------+-----+--------+----------+--------------------------------+ SFA Distal46                   monophasicprofunda artery appears to                                                reverse flow to feed distal                                               femoral artery                   +----------+--------+-----+--------+----------+--------------------------------+ POP Prox  40                   monophasic                                 +----------+--------+-----+--------+----------+--------------------------------+ POP Distal58                   monophasic                                 +----------+--------+-----+--------+----------+--------------------------------+ ATA Distal38                   monophasic                                 +----------+--------+-----+--------+----------+--------------------------------+ PTA Prox  40                   monophasic                                 +----------+--------+-----+--------+----------+--------------------------------+  Summary: Right: Patent fem-pop bypass graft. There appears to be soft thrombus noted in the common femoral artery/endarterectomy site. Left: There appears to be soft thrombus noted in the common femoral artery. Near occlusion of the proximal femoral artery and occlusion of the mid to distal femoral artery. The profunda artery appears to feed the distal femoral artery.  See table(s) above for  measurements and observations. Electronically signed by Coral Else MD on 11/26/2020 at 3:28:13 PM.    Final     Microbiology: No results found for this or any previous visit (from the past 240 hour(s)).   Labs: CBC: Recent Labs  Lab 12/19/20 0245 12/21/20  0204  WBC 9.9 9.6  NEUTROABS  --   --   HGB 13.5 13.5  HCT 43.1 42.9  MCV 98.9 98.6  PLT 228 232   Basic Metabolic Panel: Recent Labs  Lab 12/19/20 0245 12/21/20 0204  NA 137 137  K 4.4 4.8  CL 100 98  CO2 31 31  GLUCOSE 71 82  BUN 30* 28*  CREATININE 0.56 0.54  CALCIUM 8.9 8.9      Time spent: 35 minutes  Signed:  Lynden Oxford  [facility] 12/21/2020

## 2020-12-25 NOTE — Progress Notes (Signed)
Physical Therapy Session Note  Patient Details  Name: Beverly Li MRN: [id number] Date of Birth: 10-Jun-1945  Today's Date: 12/25/2020 PT Individual Time: 5809-9833 PT Individual Time Calculation (min): 55 min   Short Term Goals: Week 1:  PT Short Term Goal 1 (Week 1): Pt will complete bed mobility with modA. PT Short Term Goal 2 (Week 1): Pt will complete sit to stand transfer with modA +1. PT Short Term Goal 3 (Week 1): Pt will complete bed to chair transfer with modA +1. PT Short Term Goal 4 (Week 1): Pt will ambulate x50' with modA +2.  Skilled Therapeutic Interventions/Progress Updates:     Pt received seated in WC and agreeable to therapy. No indication of pain, though husband states he thinks pt may be having some abdominal discomfort. Abdominal binder loosely donned. PT assists to appropriately tighten abdominal binder. WC transport to gym for time management. Pt performs stand step transfer to the L with maxA and cueing for lateral weight shifting to the R, and total blocking of R knee. Mirror positioned fo visual feedback. Pt stands x2 form mat with tactile cues at posterior pelvis and sternum for posture. Pt stands with modA and tasked with tapping R foot on target placed on 3 inch step. Pt completes x1 and x2 during the respective bouts, and then requires seated rest break. PT provides heavy assistance to facilitate R lateral weight shift and pt appears to become externally and internally distracted easily during task. Pt performs sit to supine with maxA and performs x10 supine bridges with PT providing tactile cueing at R glutes to stimulate muscle contraction, though very minimal, if any noted. Following break, pt practices rolling to R and L. PT positions pt in R sidelying and cues pt to engage core muscles to roll to supine and then onto L side. Pt requires modA to complete first time but on 2nd attempt is able to roll from R to supine with light minA. Pt performs 2x10 bridges and  appears to be recruiting some R lower extremity muscle activation. Supine to sit with maxA and cues for logrolling. Squat pivot to [facility] with maxA and cues for head hips relationship. WC transport back to room. Pt performs stand step transfer back to bed with maxA. Sit to supine with maxA. Left supine with alarm intact and all needs within reach.  Therapy Documentation Precautions:  Precautions Precautions: Fall, Other (comment) Precaution Comments: R hemiplegia with R shoulder subluxation, PEG, adominal binder; bladder/bowel incontinence Restrictions Weight Bearing Restrictions: No    Therapy/Group: Individual Therapy  Beau Fanny, PT, DPT 12/25/2020, 3:31 PM

## 2020-12-25 NOTE — Progress Notes (Signed)
Occupational Therapy Session Note  Patient Details  Name: Beverly Li MRN: [id number] Date of Birth: [birth year]  Today's Date: 12/25/2020 OT Individual Time: 1100-1158 OT Individual Time Calculation (min): 58 min    Short Term Goals: Week 1:  OT Short Term Goal 1 (Week 1): Patient will maintain sitting balance with mod A during functional BADL task OT Short Term Goal 2 (Week 1): Patient will perofrm sit<>stand at the sink with mod A in preparation for BADL task OT Short Term Goal 3 (Week 1): Patient will complete 1 step of UB dressing task with mod questioning cues.  Skilled Therapeutic Interventions/Progress Updates:    Pt resting in bed upon arrival with husband present. OT intervention with focus on bed mobility, sitting balance, functional transfers, sit<>stand, standing balance, BADL retraining and activity tolerance. Pt on RA on arrival with O2 sats >90%. With activity O2 decreases to 87%. 2L O2 via [place] remainder of session and O2 sats>90%. Supine>sit EOB with max A. Squat pivot transfer to w/c with max A. See Care Tool for assist levels with bathing/dressing at sink. Sit<>stand with max A and tot A+2 to pull pants over hips. Pt require demonstrational cues to initiate bathing tasks. Pt non verbal throughout session. Pt remained in w/c with belt alarm activated, half lap tray in place, and husband and son present.   Therapy Documentation Precautions:  Precautions Precautions: Fall, Other (comment) Precaution Comments: R hemiplegia with R shoulder subluxation, PEG, adominal binder; bladder/bowel incontinence Restrictions Weight Bearing Restrictions: No  Pain: Pt with no s/s of pain   Therapy/Group: Individual Therapy  Rich Brave 12/25/2020, 12:13 PM

## 2020-12-25 NOTE — Progress Notes (Signed)
PROGRESS NOTE   Subjective/Complaints:  Per husband, has said a couple of words, but not regular. Does nod head "yes and no" but "subtle" per husband.  Ate 3 meals yesterday but usually eats 2 per spouse.  On D1 nectar thick liquids.   Got miralax yesterday- mainly having smears Had 1 small BM yesterday afternoon.   ROS: limited due to communication/cognition  Objective:   No results found. No results for input(s): WBC, HGB, HCT, PLT in the last 72 hours.  Recent Labs    12/23/20 0530 12/25/20 0534  NA 137 139  K 4.6 4.6  CL 102 100  CO2 28 30  GLUCOSE 93 91  BUN 24* 29*  CREATININE 0.60 0.66  CALCIUM 8.9 9.1    Intake/Output Summary (Last 24 hours) at 12/25/2020 0838 Last data filed at 12/24/2020 2035 Gross per 24 hour  Intake 516 ml  Output --  Net 516 ml         Physical Exam: Vital Signs Blood pressure (!) 152/96, pulse 90, temperature 97.9 F (36.6 C), temperature source Axillary, resp. rate 16, height 5\' 3"  (1.6 m), weight 50.1 kg, SpO2 94 %.   General: awake, alert, making eye contact- occ nodding slightly, but not clear if appropriate; husband at bedside;  NAD HENT: conjugate gaze; oropharynx moist; O2 via [place] 1L CV: regular rate; no JVD Pulmonary: CTA B/L; no W/R/R- good air movement GI: soft, NT, ND, (+)BS Psychiatric: making eye contact Neurological: nonverbal-   Ext: no clubbing, cyanosis, or edema Psych: in reasonable spirits Skin: old coccyx wound/scar Neuro:  pt is alert. Makes eye contact.  . Exp>rec word finding deficits, apraxia--still non-verbal. Right central 7. Poor oral-motor control. Does handle secretions. RUE and RLE 0/5, flaccid. Did not withdraw to pinch on right. Moved left side spontaneously. DTR's 2+ RLE with 2 bts clonus Musculoskeletal:  no pain with PROM.     Assessment/Plan: 1. Functional deficits which require 3+ hours per day of interdisciplinary therapy in a  comprehensive inpatient rehab setting. Physiatrist is providing close team supervision and 24 hour management of active medical problems listed below. Physiatrist and rehab team continue to assess barriers to discharge/monitor patient progress toward functional and medical goals  Care Tool:  Bathing    Body parts bathed by patient: Front perineal area, Face   Body parts bathed by helper: Right arm, Left arm, Buttocks, Right upper leg, Left lower leg, Right lower leg, Left upper leg     Bathing assist Assist Level: Dependent - Patient 0%     Upper Body Dressing/Undressing Upper body dressing        Upper body assist Assist Level: Total Assistance - Patient < 25%    Lower Body Dressing/Undressing Lower body dressing      What is the patient wearing?: Pants     Lower body assist Assist for lower body dressing: Dependent - Patient 0%     Toileting Toileting    Toileting assist Assist for toileting: Dependent - Patient 0%     Transfers Chair/bed transfer  Transfers assist     Chair/bed transfer assist level: Maximal Assistance - Patient 25 - 49%     Locomotion  Ambulation   Ambulation assist      Assist level: 2 helpers Assistive device: Hand held assist (L hall rail) Max distance: 20'   Walk 10 feet activity   Assist     Assist level: 2 helpers Assistive device: Hand held assist (L hall rail)   Walk 50 feet activity   Assist Walk 50 feet with 2 turns activity did not occur: Safety/medical concerns         Walk 150 feet activity   Assist Walk 150 feet activity did not occur: Safety/medical concerns         Walk 10 feet on uneven surface  activity   Assist Walk 10 feet on uneven surfaces activity did not occur: Safety/medical concerns         Wheelchair     Assist Is the patient using a wheelchair?: Yes Type of Wheelchair: Manual    Wheelchair assist level: Dependent - Patient 0% Max wheelchair distance: 150'     Wheelchair 50 feet with 2 turns activity    Assist        Assist Level: Dependent - Patient 0%   Wheelchair 150 feet activity     Assist      Assist Level: Dependent - Patient 0%   Blood pressure (!) 152/96, pulse 90, temperature 97.9 F (36.6 C), temperature source Axillary, resp. rate 16, height 5\' 3"  (1.6 m), weight 50.1 kg, SpO2 94 %. Medical Problem List and Plan: 1.  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  Antithrombotics: -DVT/anticoagulation: Eliquis [employer]: Other (comment)             -antiplatelet therapy: N/A 3. Pain Management: Tylenol as needed 4. Mood: Wellbutrin 75 mg twice daily             -antipsychotic agents: N/A 5. Neuropsych: This patient is not capable of making decisions on her own behalf. 6. Skin/Wound Care: Routine skin checks 7. Fluids/Electrolytes/Nutrition: encourage PO  -BUN sl elevated. Added FW thru tube  -protein for low albumin   -recheck labs Monday  -ate 15-50% meals 9/10, receiving TF after meal if not eating 50% or more  9/12- ate every  meal (but not clear how much) yesterday per husband- BUN up to 29- from 24- elevated- will increase free water to 300cc 4x/day 8.  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 12/03/2020-  -MBS demonstrated improvement in oropharyngeal function with apraxia  -D1 nectars initiated 9/9, meds via PEG 9.  Atrial fibrillation.  Eliquis.  Amiodarone 200 mg daily, Lopressor 12.5 mg twice daily.  Cardiac rate controlled at present 10.  Hyperlipidemia.  Zetia 11.  COPD.Revefenscin nebulizer daily, Brovana as well as Pulmicort 12.  Acute on chronic diastolic congestive heart failure.     Filed Weights   12/23/20 0500 12/24/20 0446 12/25/20 0400  Weight: 48.9 kg  48.8 kg 50.1 kg    -weights generally stable- continue to track 13. HTN:   controlled except for occ DBP elevation  9/12- BP controlled- con't regimen       LOS: 4 days A FACE TO FACE EVALUATION WAS PERFORMED  Syann Cupples 12/25/2020, 8:38 AM

## 2020-12-25 NOTE — Progress Notes (Signed)
Speech Language Pathology Daily Session Note  Patient Details  Name: MATA ROWEN MRN: [id number] Date of Birth: 03-04-1946  Today's Date: 12/25/2020 SLP Individual Time: 1696-7893 SLP Individual Time Calculation (min): 60 min  Short Term Goals: Week 1: SLP Short Term Goal 1 (Week 1): Patient will consume current diet with minimal overt s/s of aspiration with Min verba and visual cues for use of swallowing compensatory strategies. SLP Short Term Goal 2 (Week 1): Patient will consume thin liquids with minimal overt s/s of aspiration with Min verbal cues for use of small sips via cup. SLP Short Term Goal 3 (Week 1): Patient will answer yes/no questions utilizing multimodal communication with 25% accuracy and Max A multimodal cues. SLP Short Term Goal 4 (Week 1): Patient will vocalize on command in 25% of opportunities with Max A multimodal cues.  Skilled Therapeutic Interventions: Skilled treatment session focused on communication goals. SLP facilitated session by introducing a yes/no communication board. Patient was able to answer basic yes/no biographically and orientation questions with 75% accuracy. Patient also demonstrated appropriate reading comprehension from a field of 2 with 88% accuracy and identified words appropriately from a field of 6 with 100% accuracy with intentions of initiating a communication board. Accuracy decreased to 65% accuracy when patient had to identify the correct word when given an abstract situation. SLP created a basic communication board and yes/no board for staff an family to utilize for expression of basic wants/needs. Patient's husband present and educated regarding use. Patient also wrote her age, husband's name and last name with 75% accuracy while utilizing her LUE. Patient left upright in bed with alarm on and all needs within reach. Continue with current plan of care.       Pain No/Denies Pain   Therapy/Group: Individual Therapy  Braniyah Besse,  Cobey Raineri 12/25/2020, 3:00 PM

## 2020-12-26 NOTE — Progress Notes (Signed)
Speech Language Pathology Daily Session Note  Patient Details  Name: Beverly Li MRN: [id number] Date of Birth: 17-Oct-1945  Today's Date: 12/26/2020 SLP Individual Time: 5329-9242 SLP Individual Time Calculation (min): 41 min  Short Term Goals: Week 1: SLP Short Term Goal 1 (Week 1): Patient will consume current diet with minimal overt s/s of aspiration with Min verba and visual cues for use of swallowing compensatory strategies. SLP Short Term Goal 2 (Week 1): Patient will consume thin liquids with minimal overt s/s of aspiration with Min verbal cues for use of small sips via cup. SLP Short Term Goal 3 (Week 1): Patient will answer yes/no questions utilizing multimodal communication with 25% accuracy and Max A multimodal cues. SLP Short Term Goal 4 (Week 1): Patient will vocalize on command in 25% of opportunities with Max A multimodal cues.  Skilled Therapeutic Interventions: Patient agreeable to skilled ST intervention with focus on communication and dysphagia goals. Pt was accompanied by her spouse and niece this date. SLP facilitated session by providing max A with oral care using suction toothbrush. Provided pt with thin liquid water sips by spoon with one instance of immediate coughing with initial trial, otherwise no other overt s/sx of aspiration with subsequent trials. Pt exhibited prolonged AP transit, possible delayed swallow initiation, and anterior labial spillage. Facilitated singing of familiar songs (Happy [place] Ouch, Romero Liner) with max multimodal cues. Patient unable to vocalize however she was seen tapping her hand and foot along with some of the words. Spouse played pt preferred songs Mariana Kaufman [place], [place]) and pt executed foot tapping to rhythm with 25-50% accuracy while SLP provided tactile stimulation on pt's hand to maintain rhythm. SLP also facilitated session by providing max A multimodal cues for voicing /ah/. Pt unsuccessful with voicing despite attempts to generate  tension by pushing hand against SLP's, however improvement noted with opening mouth for shaping of vowel. Pt utilized yes/no communication board to respond to basic questions with mod-to-max cues. Difficult to decipher responses as pt had tendency to point to both yes and no. Patient was left in chair with alarm activated and immediate needs within reach at end of session. Continue per current plan of care.      Pain Pain Assessment Pain Scale: 0-10 Pain Score: 0-No pain  Therapy/Group: Individual Therapy  Tamala Ser 12/26/2020, 3:51 PM

## 2020-12-26 NOTE — Progress Notes (Addendum)
PROGRESS NOTE   Subjective/Complaints:  Per husband, no issues-  Per nursing, had 2 medium BM's last night- husband thinks that she did need to go- - is incontinent.    ROS: limited by cognition/communication Objective:   No results found. No results for input(s): WBC, HGB, HCT, PLT in the last 72 hours.  Recent Labs    12/25/20 0534  NA 139  K 4.6  CL 100  CO2 30  GLUCOSE 91  BUN 29*  CREATININE 0.66  CALCIUM 9.1    Intake/Output Summary (Last 24 hours) at 12/26/2020 0925 Last data filed at 12/26/2020 0845 Gross per 24 hour  Intake 253 ml  Output --  Net 253 ml         Physical Exam: Vital Signs Blood pressure (!) 148/92, pulse 65, temperature 97.7 F (36.5 C), temperature source Oral, resp. rate 19, height 5\' 3"  (1.6 m), weight 50.3 kg, SpO2 100 %.    General: awake, alert, appropriate, laying supine in bed; husband at bedside; NAD HENT: conjugate gaze; oropharynx moist CV: regular rate; no JVD Pulmonary: CTA B/L; no W/R/R- good air movement; wearing O2 by - 1L GI: soft, NT, ND, (+)BS Psychiatric: nonverbal- flat- some eye contact- less than yesterday Neurological: nonverbal    Ext: no clubbing, cyanosis, or edema Psych: in reasonable spirits Skin: old coccyx wound/scar Neuro:  pt is alert. Makes eye contact.  . Exp>rec word finding deficits, apraxia--still non-verbal. Right central 7. Poor oral-motor control. Does handle secretions. RUE and RLE 0/5, flaccid. Did not withdraw to pinch on right. Moved left side spontaneously. DTR's 2+ RLE with 2 bts clonus Musculoskeletal:  no pain with PROM.     Assessment/Plan: 1. Functional deficits which require 3+ hours per day of interdisciplinary therapy in a comprehensive inpatient rehab setting. Physiatrist is providing close team supervision and 24 hour management of active medical problems listed below. Physiatrist and rehab team continue to assess  barriers to discharge/monitor patient progress toward functional and medical goals  Care Tool:  Bathing    Body parts bathed by patient: Left arm, Abdomen, Chest, Face   Body parts bathed by helper: Right arm, Front perineal area, Buttocks, Right upper leg, Left upper leg, Right lower leg, Left lower leg     Bathing assist Assist Level: Total Assistance - Patient < 25%     Upper Body Dressing/Undressing Upper body dressing   What is the patient wearing?: Pull over shirt    Upper body assist Assist Level: Total Assistance - Patient < 25%    Lower Body Dressing/Undressing Lower body dressing      What is the patient wearing?: Pants     Lower body assist Assist for lower body dressing: Dependent - Patient 0%     Toileting Toileting    Toileting assist Assist for toileting: Dependent - Patient 0%     Transfers Chair/bed transfer  Transfers assist     Chair/bed transfer assist level: Maximal Assistance - Patient 25 - 49%     Locomotion Ambulation   Ambulation assist      Assist level: 2 helpers Assistive device: Hand held assist (L hall rail) Max distance: 20'   Walk  10 feet activity   Assist     Assist level: 2 helpers Assistive device: Hand held assist (L hall rail)   Walk 50 feet activity   Assist Walk 50 feet with 2 turns activity did not occur: Safety/medical concerns         Walk 150 feet activity   Assist Walk 150 feet activity did not occur: Safety/medical concerns         Walk 10 feet on uneven surface  activity   Assist Walk 10 feet on uneven surfaces activity did not occur: Safety/medical concerns         Wheelchair     Assist Is the patient using a wheelchair?: Yes Type of Wheelchair: Manual    Wheelchair assist level: Dependent - Patient 0% Max wheelchair distance: 150'    Wheelchair 50 feet with 2 turns activity    Assist        Assist Level: Dependent - Patient 0%   Wheelchair 150 feet  activity     Assist      Assist Level: Dependent - Patient 0%   Blood pressure (!) 148/92, pulse 65, temperature 97.7 F (36.5 C), temperature source Oral, resp. rate 19, height 5\' 3"  (1.6 m), weight 50.3 kg, SpO2 100 %. Medical Problem List and Plan: 1.  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  Antithrombotics: -DVT/anticoagulation: Eliquis [employer]: Other (comment)             -antiplatelet therapy: N/A 3. Pain Management: Tylenol as needed 4. Mood: Wellbutrin 75 mg twice daily             -antipsychotic agents: N/A 5. Neuropsych: This patient is not capable of making decisions on her own behalf. 6. Skin/Wound Care: Routine skin checks 7. Fluids/Electrolytes/Nutrition: encourage PO  -BUN sl elevated. Added FW thru tube  -protein for low albumin   -recheck labs Monday  -ate 15-50% meals 9/10, receiving TF after meal if not eating 50% or more  9/12- ate every  meal (but not clear how much) yesterday per husband- BUN up to 29- from 24- elevated- will increase free water to 300cc 4x/day  9/13- will check labs in AM to see if BUN improving.  8.  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 12/03/2020-  -MBS demonstrated improvement in oropharyngeal function with apraxia  -D1 nectars initiated 9/9, meds via PEG 9.  Atrial fibrillation.  Eliquis.  Amiodarone 200 mg daily, Lopressor 12.5 mg twice daily.  Cardiac rate controlled at present 10.  Hyperlipidemia.  Zetia 11.  COPD.Revefenscin nebulizer daily, Brovana as well as Pulmicort  9/13- still on 1L of O2- cont' to wean as tolerated 12.  Acute on chronic diastolic congestive heart failure.     Filed Weights   12/24/20 0446 12/25/20 0400 12/26/20 0318  Weight: 48.8 kg 50.1 kg 50.3 kg    -weights  generally stable- continue to track  9/13- weight up slightly- will con't to monitor closely.  13. HTN:   controlled except for occ DBP elevation  9/13- BP controlled- con't regimen  14. Constipation  9/13- had 2 medium incontinent Bms last night- will monitor  LOS: 5 days A FACE TO FACE EVALUATION WAS PERFORMED  Nirvana Blanchett 12/26/2020, 9:25 AM

## 2020-12-26 NOTE — Patient Care Conference (Signed)
Inpatient RehabilitationTeam Conference and Plan of Care Update Date: 12/26/2020   Time: 10:33 AM    Patient Name: Beverly Li      Medical Record Number: [id number]  Date of Birth: [birth year] Sex: Female         Room/Bed: 4M08C/4M08C-01 Payor Info: Payor: Advertising copy[employer] MEDICARE / Plan: [facility] MEDICARE / Product Type: *No Product type* /    Admit Date/Time:  12/21/2020  3:41 PM  Primary Diagnosis:  Left middle cerebral artery stroke [name])  Hospital Problems: Principal Problem:   Left middle cerebral artery stroke [facility])    Expected Discharge Date: Expected Discharge Date: 01/19/21  Team Members Present: Physician leading conference: Dr. Maryla Morrow Social Worker Present: Cecile Sheerer, LCSWA Nurse Present: Kennyth Arnold, RN PT Present: Malachi Pro, PT OT Present: Kearney Hard, OT SLP Present: Feliberto Gottron, SLP PPS Coordinator present : Fae Pippin, SLP     Current Status/Progress Goal Weekly Team Focus  Bowel/Bladder   Incontinent of bowel and bladder. Last BM 9/13  Decreased incontinent episodes  Q 2 hour toiletting and PRN   Swallow/Nutrition/ Hydration   Dys. 1 textures with nectar-thick liquid, Min A  Supervision  tolerance of current diet, trials of thin and Dys. 2 textures   ADL's   Max A +2  Min/CGA  R UE NMR, NMES, transfers, self-care retraining   Mobility   maxA bed mobility, modA/maxA transfers, +2 ambulation x20' with L hall rail  minA  R hemibody NMR, transfers, balance, ambulation   Communication   Mod A for multimodal communication, Min A for comprehension of basic information  Supervision-Min A  use of communication board for expresssion of wants/needs, vocalizations on command   Safety/Cognition/ Behavioral Observations            Pain   No reports of pain  <3  Assess Q shift and PRN for change in pain   Skin   G tube site - split gauze. Area without redness, drainage or swelling.  Mantain site free from infection. Site to remain clean,  dry, intact  Dressing change Q day and PRN     Discharge Planning:  Anticipate for pt to d/c to home with her husband and hired caregivers.   Team Discussion: Still mildly dehydrated, push fluids. Weight is stable, BP controlled, attempt to wean from O2. Incontinent B/B, has PRN's for pain. Healed stage 2 to sacrum/coccyx, covered with foam. Peg in place. Now on Dys 1, nectar thick liquids. Educating on diabetes, and aspiration precautions. Home with husband who is to hire help. Mod/max assist with ADL's, +2 is helpful. Max assist with PT, no return in RLE. Ambulated about 20 ft in hallway with PT managing all weight. Reading at word level intact, using communication board. Doing trials of thin liquids.  Patient on target to meet rehab goals: yes  *See Care Plan and progress notes for long and short-term goals.   Revisions to Treatment Plan:  Not at this time.  Teaching Needs: Family education, medication management, pain management, bowel/bladder management, skin/wound care, diabetes management, transfer training, gait training, balance training, endurance training, safety awareness.  Current Barriers to Discharge: Decreased caregiver support, Medical stability, Home enviroment access/layout, Incontinence, Wound care, Lack of/limited family support, Medication compliance, and Nutritional means  Possible Resolutions to Barriers: Continue current medications, provide emotional support.     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, PCA stroke, complicated by aspiration pneumonia  Barriers to Discharge: Medical  stability;Nutrition means;Decreased family/caregiver support   Possible Resolutions to [employer]: Therapies, optimize DM meds, optimize BP meds, follow weights   Continued Need for Acute Rehabilitation Level of Care: The patient requires daily medical management by a physician with specialized training in  physical medicine and rehabilitation for the following reasons: Direction of a multidisciplinary physical rehabilitation program to maximize functional independence : Yes Medical management of patient stability for increased activity during participation in an intensive rehabilitation regime.: Yes Analysis of laboratory values and/or radiology reports with any subsequent need for medication adjustment and/or medical intervention. : Yes   I attest that I was present, lead the team conference, and concur with the assessment and plan of the team.   Tennis Must 12/26/2020, 2:19 PM

## 2020-12-26 NOTE — Progress Notes (Signed)
Occupational Therapy Session Note  Patient Details  Name: Beverly Li MRN: [id number] Date of Birth: 1945-05-31  Today's Date: 12/26/2020 OT Individual Time: 1121-1208 OT Individual Time Calculation (min): 47 min    Short Term Goals: Week 1:  OT Short Term Goal 1 (Week 1): Patient will maintain sitting balance with mod A during functional BADL task OT Short Term Goal 2 (Week 1): Patient will perofrm sit<>stand at the sink with mod A in preparation for BADL task OT Short Term Goal 3 (Week 1): Patient will complete 1 step of UB dressing task with mod questioning cues.  Skilled Therapeutic Interventions/Progress Updates:    Pt in wheelchair to start with spouse present.  She was inconsistent with "yes/no" communication board when used.  She pointed "yes" when asked if she was having pain, but could not point to where she was hurting.  She did not demonstrate any signs of pain or discomfort.  O2 sats at 93% on 2Ls at rest with HR at 81.  Transported her down to the therapy gym where she completed sit to stand with mod assist for transfer to the mat.  Noted pt with bowel and bladder incontinence so returned to the wheelchair and then back to the room.  Worked on Pension scheme manager and cleaning peri area, with donning of new clothing.  She was able to complete sit to stand several times at the sink with mod assist but needed total assist for cleaning front and back peri area.  Max assist for donning brief and pants with mod demonstrational cueing for donning the right side first.  Mod assist for donning gripper socks as well, crossing each LE over the opposite knee.  Finished session with pt resting in the wheelchair with the call button and phone in reach and safety alarm belt in place.    Therapy Documentation Precautions:  Precautions Precautions: Fall, Other (comment) Precaution Comments: R hemiplegia with R shoulder subluxation, PEG, adominal binder; bladder/bowel  incontinence Restrictions Weight Bearing Restrictions: No  Pain: Pain Assessment Pain Scale: Faces Pain Score: 0-No pain ADL: See Care Tool Section for some details of mobility and selfcare  Therapy/Group: Individual Therapy  Yiselle Babich OTR/L 12/26/2020, 12:46 PM

## 2020-12-26 NOTE — Progress Notes (Signed)
Patient ID: Beverly Li, female   DOB: 1945-06-11, 75 y.o.   MRN: [id number]  SW met with pt husband to provide updates from team conference, and d/c date 10/7. SW provided him private duty aide list. SW answered questions related to discharge which include DME process, and private aide service. SW explained process on DME delivery, and he will be informed on recommended DME once aware on her care needs. SW also informed there will be family education in which he may want to have his son [name] present. He requested HHA list as well.   *SW provided him with HHA list.   Loralee Pacas, MSW, [place] Office: [phone] Cell: [phone] Fax: [phone]

## 2020-12-26 NOTE — Progress Notes (Signed)
Physical Therapy Session Note  Patient Details  Name: Beverly Li MRN: [id number] Date of Birth: 16-Jan-1946  Today's Date: 12/26/2020 PT Individual Time: 0932-1028 PT Individual Time Calculation (min): 56 min   Short Term Goals: Week 1:  PT Short Term Goal 1 (Week 1): Pt will complete bed mobility with modA. PT Short Term Goal 2 (Week 1): Pt will complete sit to stand transfer with modA +1. PT Short Term Goal 3 (Week 1): Pt will complete bed to chair transfer with modA +1. PT Short Term Goal 4 (Week 1): Pt will ambulate x50' with modA +2.  Skilled Therapeutic Interventions/Progress Updates:     Pt received supine in bed and agrees to therapy. No complaint of pain. PT threads pants over both legs and pt performs bridging to assist with pulling up pants. Supine to sit with maxA. Pt performs stand step transfer to [facility] with maxA and cues for food placement, PT blocking R knee and facilitating R lateral weight shift. WC transport to gym for time management. Pt performs squat pivot transfer to mat table with maxA and tactile input through R femur for neuromuscular feedback and approximation. Pt practices standing balance with mirror for visual feedback. Multiple reps of sit to stand with modA/maxA. With modA +2, pt practices targeted toe tapping with L lower extremity, with PT totally blocking R knee and facilitating R lateral weight shifiting. Pt is able to slide foot to targets ~75% of time, but requires assistance to lift L foot from ground as she appears hesitant to shift weight adequately to R leg to clear L foot.   Pt ambulates in hallway with L handrail, with PT providing maxA and +2 WC follow for safety. PT blocking R knee during stance phase and provides total assistance to manage R leg during swing phase. PT provides external target for pt's L foot to encourage swing through reciprocal gait pattern, which pt is only able to complete ~25% of time, and otherwise performs step-to pattern due to  inadequate weight shifting to the R. Pt does demonstrate improved trunk control relative to previous sessions though, and does not forward flex at hips and trunk during gait training. Pt ambulates 20' total.  Pt left seated in WC with alarm intact and all needs within reach.  Therapy Documentation Precautions:  Precautions Precautions: Fall, Other (comment) Precaution Comments: R hemiplegia with R shoulder subluxation, PEG, adominal binder; bladder/bowel incontinence Restrictions Weight Bearing Restrictions: No    Therapy/Group: Individual Therapy  Beau Fanny, PT, DPT 12/26/2020, 4:19 PM

## 2020-12-26 NOTE — Progress Notes (Signed)
Speech Language Pathology Daily Session Note  Patient Details  Name: GENNIE EISINGER MRN: [id number] Date of Birth: 03-11-1946  Today's Date: 12/26/2020 SLP Individual Time: 2353-6144 SLP Individual Time Calculation (min): 45 min  Short Term Goals: Week 1: SLP Short Term Goal 1 (Week 1): Patient will consume current diet with minimal overt s/s of aspiration with Min verba and visual cues for use of swallowing compensatory strategies. SLP Short Term Goal 2 (Week 1): Patient will consume thin liquids with minimal overt s/s of aspiration with Min verbal cues for use of small sips via cup. SLP Short Term Goal 3 (Week 1): Patient will answer yes/no questions utilizing multimodal communication with 25% accuracy and Max A multimodal cues. SLP Short Term Goal 4 (Week 1): Patient will vocalize on command in 25% of opportunities with Max A multimodal cues.  Skilled Therapeutic Interventions: Skilled treatment session focused on dysphagia and communication goals. SLP facilitated session by providing thorough oral care via the suction toothbrush. Patient consumed small sips of thin liquids via spoon without overt s/s of aspiration but with prolonged AP transit and anterior spillage. Recommend continued supervision with sips of thin liquids. SLP also facilitated session by providing Max A multimodal cues for patient to voice /ah/ on command. Patient with increased success with voicing when tension was created within the vocal cords when pushing her hand against the SLPs. Patient also utilized the yes/no communication board with [employer] and tactile cues. Patient left upright in bed with alarm on and all needs within reach. Continue with current plan of care.   Pain No/Denies Pain   Therapy/Group: Individual Therapy  Gordana Kewley 12/26/2020, 12:16 PM

## 2020-12-26 NOTE — Progress Notes (Signed)
Occupational Therapy Session Note  Patient Details  Name: Beverly Li MRN: [id number] Date of Birth: June 19, 1945  Today's Date: 12/26/2020 OT Individual Time: 1315-1400 OT Individual Time Calculation (min): 45 min   Short Term Goals: Week 1:  OT Short Term Goal 1 (Week 1): Patient will maintain sitting balance with mod A during functional BADL task OT Short Term Goal 2 (Week 1): Patient will perofrm sit<>stand at the sink with mod A in preparation for BADL task OT Short Term Goal 3 (Week 1): Patient will complete 1 step of UB dressing task with mod questioning cues.  Skilled Therapeutic Interventions/Progress Updates:    Pt greeted seated in wc and agreeable to OT treatment session. Pt brought down to therapy gym and completed stand-pivot to wc with max A of 1 to weaker R side. OT brought pt into gravity eliminated sidelying position with R hand placed on table in neutral position. R hand placed in hand skate. R UR NMR with facilitation to achieve elbow flex/ext and shoulder flex/ext. Pt flaccid with no muscle contraction noted throughout R UE. OT provided joint input through wrist and elbow for neuro re-ed. Scap mobilizations in sidelying. PT brought back into sitting with max A. Worked on sitting balance, posterior pelvic tilt, trunk elongation, and R UE weight bearing sitting on mat. OT utilized NDT techniques to facilitate normal movement patterns when reaching to collect horse shoes. OT placed SAEBO e-stim on wrist extensors. SAEBO left on for 60 minutes. OT returned to remove SAEBO with skin intact and no adverse reactions.  Saebo Stim One 330 pulse width 35 Hz pulse rate On 8 sec/ off 8 sec Ramp up/ down 2 sec Symmetrical Biphasic wave form  Max intensity 161m at 500 Ohm load  Pt left seated in wc at end of session with alarm belt on, family present, and needs met.    Therapy Documentation Precautions:  Precautions Precautions: Fall, Other (comment) Precaution Comments: R  hemiplegia with R shoulder subluxation, PEG, adominal binder; bladder/bowel incontinence Restrictions Weight Bearing Restrictions: No  Pain: Pain Assessment Pain Scale: Faces Pain Score: 0-No pain   Therapy/Group: Individual Therapy  EValma Cava9/13/2022, 2:20 PM

## 2020-12-27 LAB — BASIC METABOLIC PANEL
Anion gap: 7 (ref 5–15)
BUN: 24 mg/dL — ABNORMAL HIGH (ref 8–23)
CO2: 31 mmol/L (ref 22–32)
Calcium: 8.8 mg/dL — ABNORMAL LOW (ref 8.9–10.3)
Chloride: 99 mmol/L (ref 98–111)
Creatinine, Ser: 0.68 mg/dL (ref 0.44–1.00)
GFR, Estimated: >60 mL/min (ref >60)
Glucose, Bld: 84 mg/dL (ref 70–99)
Potassium: 4.7 mmol/L (ref 3.5–5.1)
Sodium: 137 mmol/L (ref 135–145)

## 2020-12-27 MED ORDER — SENNOSIDES-DOCUSATE SODIUM 8.6-50 MG PO TABS
1.0000 | ORAL_TABLET | Freq: Every day | ORAL | Status: DC
  Administered 2020-12-28 – 2020-12-31 (×3): 1 via ORAL
  Filled 2020-12-27 (×3): qty 1

## 2020-12-27 NOTE — Progress Notes (Signed)
Occupational Therapy Session Note  Patient Details  Name: Beverly Li MRN: [id number] Date of Birth: [birth year]  Today's Date: 12/27/2020 OT Individual Time: 1105-1200 OT Individual Time Calculation (min): 55 min    Short Term Goals: Week 1:  OT Short Term Goal 1 (Week 1): Patient will maintain sitting balance with mod A during functional BADL task OT Short Term Goal 2 (Week 1): Patient will perofrm sit<>stand at the sink with mod A in preparation for BADL task OT Short Term Goal 3 (Week 1): Patient will complete 1 step of UB dressing task with mod questioning cues.  Skilled Therapeutic Interventions/Progress Updates:     Pt received in bed with no pain indicated.  ADL:  Pt completes sup>sit with MAX A and VC for WB into L elbow for midline orientation while OT dons shoes total A for time. Squat pivot transfer with MAX A and R knee block with multimodal cuing for hand placement and anterior weight shift. Hemi dressing for UB with pt requiring MAX A overall d/t R inattention and poor continuation skill. Mirror utilized for feedback during dressing tasks.   Neuromuscular Reeducation Pt completes seated towel glides shoulder flex/ext, scapular pro/retraction, and scap elevation/depression. OT facilitates scapular mobility with trace activation with retraction as well as elevation/depression. Pt does better with mirror for visual feedback and auditory cues.  Saebo Stim One 330 pulse width 35 Hz pulse rate On 8 sec/ off 8 sec Ramp up/ down 2 sec Symmetrical Biphasic wave form  Max intensity at 500 Ohm load Wrist ext-skin in tact    Pt left at end of session in w/c with family in room supervising pt. Pt gets restless with belt alarm per husband & he verbalized understanding to not assist pt with mobility and pt should not be left alone in room without supervision when up in w/c   Therapy Documentation Precautions:  Precautions Precautions: Fall, Other  (comment) Precaution Comments: R hemiplegia with R shoulder subluxation, PEG, adominal binder; bladder/bowel incontinence Restrictions Weight Bearing Restrictions: No  Therapy/Group: Individual Therapy  Shon Hale 12/27/2020, 6:55 AM

## 2020-12-27 NOTE — Progress Notes (Signed)
Speech Language Pathology Daily Session Note  Patient Details  Name: CARLOTTA TELFAIR MRN: [id number] Date of Birth: 1945-07-29  Today's Date: 12/27/2020 SLP Individual Time: 4970-2637 SLP Individual Time Calculation (min): 60 min  Short Term Goals: Week 1: SLP Short Term Goal 1 (Week 1): Patient will consume current diet with minimal overt s/s of aspiration with Min verba and visual cues for use of swallowing compensatory strategies. SLP Short Term Goal 2 (Week 1): Patient will consume thin liquids with minimal overt s/s of aspiration with Min verbal cues for use of small sips via cup. SLP Short Term Goal 3 (Week 1): Patient will answer yes/no questions utilizing multimodal communication with 25% accuracy and Max A multimodal cues. SLP Short Term Goal 4 (Week 1): Patient will vocalize on command in 25% of opportunities with Max A multimodal cues.  Skilled Therapeutic Interventions: Pt seen for skilled ST with focus on cognitive and swallowing goals, pt husband and daughter in law present throughout. SLP facilitated session by providing mod A for thorough oral care via the suction toothbrush. Pt consumed small sips of thin liquids via spoon with 1/20 swallows followed by immediate cough likely as a result of decreased attention to task (pt eyes seen darting around room before swallow initiated). Pt benefiting from occ verbal cues to increase awareness of bolus during oral phase and trigger swallow. SLP also facilitated session by utilizing poster of family and Mod A for pt to point to family members and answer simple yes/no questions. Pt able to follow 1-step commands to point to family members with 100% accuracy, able to follow 1-step commands without external prompt (point to ceiling, touch your nose) with ~50% accuracy provided repetition. Pt utilizing yes/no communication board with min verbal cues with ~80% accuracy this date, demonstrating increased self-awareness and self-correction if pointing  to wrong response initially.  SLP facilitated session by providing max fading to min A multimodal cues for pt to vocalize /ah/ on command. Pt does best with "take a deep belly breath" following by SLP gently pulling down on chin to open mouth. Pt completing sustained /ah/ with varying degrees of intensity x20. Family poster board hung in pt room and pt left in bed with family present, alarm set and all needs within reach. Cont ST POC.   Pain Pain Assessment Pain Scale: 0-10 Pain Score: 0-No pain  Therapy/Group: Individual Therapy  Tacey Ruiz 12/27/2020, 10:44 AM

## 2020-12-27 NOTE — Progress Notes (Signed)
Physical Therapy Session Note  Patient Details  Name: Beverly Li MRN: [id number] Date of Birth: 1945/08/29  Today's Date: 12/27/2020 PT Individual Time: 0800-0900 and 9628-3662 PT Individual Time Calculation (min): 60 min and 33 min  Short Term Goals: Week 1:  PT Short Term Goal 1 (Week 1): Pt will complete bed mobility with modA. PT Short Term Goal 2 (Week 1): Pt will complete sit to stand transfer with modA +1. PT Short Term Goal 3 (Week 1): Pt will complete bed to chair transfer with modA +1. PT Short Term Goal 4 (Week 1): Pt will ambulate x50' with modA +2.  Skilled Therapeutic Interventions/Progress Updates:     Session 1: Patient in bed with her husband at bedside upon PT arrival. Patient alert and agreeable to PT session using Yes/No board. Patient denied pain during session. Utilized Yes/No board throughout session with increased time for motor planning and processing. 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   Vitals: SPO2: 94-97% on 2 L/min throughout session HR: 70's-80's at rest, 110's with mobility with mild SOB  Gait Training:  Patient ambulated 32 feet x2 with min A +2 with 1 person HHA on L and PT facilitating R foot placement, limb advancement, blocking R knee in stance and weight shifting throughout. Patient with initiation of R limb advancement >50% of the time. Ambulated with lack of DF activation leading to toe catching or dragging, recommend use of DF wrap next session. Provided mod multimodal cues for sequencing, weight shift, and R hamstring activation to initiate swing.  Wheelchair Mobility:  Patient was transported in the w/c with total A throughout session for energy conservation and time management.  Neuromuscular Re-ed: Patient performed the following functional activities with focus on functional movement patterns without compensatory strategies for increased engagement and motor control of R  hemibody: -bridging with facilitation to R knee and foot x2 for pulling up pants over hips -rolling R/L x2 focused on engagement of R hemi-body and use of head turn to facilitate rolling with mod A to the L and min A to the R -R side-lying to/from sit with min-mod A, facilitating R elbow transition for trunk control and approximation of GH joint, facilitation of core engagement to bring knees to chest to initiate lifting legs off/on the bed, and forward rotation of L shoulder to increased core contraction and balance with pushing up to sitting  -sitting balance with supervision while donning tennis shoes with total A, retropulsion occurred x1 when reaching L foot forward, required max A to return to sitting -sit to/from stand with mod A +2 progressing to min A +2 x4 with 1 person HHA on L and PT facilitating anterior pelvic tilt, forward weight shift, and R knee/hip/trunk extension -standing balance 2x 1-2 min with min A L HHA and min A for R knee extension -stand pivot min A +2 with focus on weight shifting to initiate stepping   Patient in bed with her husband at bedside at end of session with breaks locked, bed alarm set, and all needs within reach.   Session 2: Patient in bed with her husband at bedside upon PT arrival. Patient alert and agreeable to PT session. Patient denied pain during session. Utilized Yes/No board for patient communication throughout session.   Therapeutic Activity: Bed Mobility: Patient performed supine to/from sit with min-mod A as above. Sitting EOB with supervision patient attempted to verbalize, provided max multimodal cues with increased time for patient  response for word finding and motor planning. Patient's husband reports that the patient was speaking earlier today when upset due to incontinent BM. PT educated on emotional brain centers that can facilitate language production and automaticity of speech production reducing motor apraxia. Also, discussed previous  education from SLP about music centers versus language centers in the brain. Utilized music, selected by patient from a variety of artists listed by her husband, for improved patient motivation and mood during session.  Neuromuscular Re-ed: Patient performed the following functional activities with focus on functional movement patterns without compensatory strategies for increased engagement and motor control of R hemibody: -sit to stand with assist from PT as above, except no second person for L HHA x2 -standing balance >2 min x2 with min A-CGA without upper extremity support; focused on weight shifting to rhythm of the music, patient demonstrating increased proximal muscle activation and swaying hips independently  -R elbow flexion/extension with PNF tapping for increased attention to R arm, noted trace triceps activation intermittently  Patient in bed with her husband at bedside at end of session with breaks locked, bed alarm set, and all needs within reach.   Therapy Documentation Precautions:  Precautions Precautions: Fall, Other (comment) Precaution Comments: R hemiplegia with R shoulder subluxation, PEG, adominal binder; bladder/bowel incontinence Restrictions Weight Bearing Restrictions: No    Therapy/Group: Individual Therapy  Leeandre Nordling L Geddy [place] PT, DPT  12/27/2020, 4:26 PM

## 2020-12-27 NOTE — Progress Notes (Signed)
PROGRESS NOTE   Subjective/Complaints:  Up in bed receiving breathing treatment. No problems overnight.   Review of Systems  Unable to perform ROS: Language . Objective:   No results found. No results for input(s): WBC, HGB, HCT, PLT in the last 72 hours.  Recent Labs    12/25/20 0534 12/27/20 0508  NA 139 137  K 4.6 4.7  CL 100 99  CO2 30 31  GLUCOSE 91 84  BUN 29* 24*  CREATININE 0.66 0.68  CALCIUM 9.1 8.8*    Intake/Output Summary (Last 24 hours) at 12/27/2020 0824 Last data filed at 12/26/2020 1857 Gross per 24 hour  Intake 336 ml  Output --  Net 336 ml         Physical Exam: Vital Signs Blood pressure (!) 167/94, pulse 82, temperature 97.8 F (36.6 C), resp. rate 16, height 5\' 3"  (1.6 m), weight 51.2 kg, SpO2 98 %.    Constitutional: No distress . Vital signs reviewed. HEENT: NCAT, EOMI, oral membranes moist Neck: supple Cardiovascular: RRR without murmur. No JVD    Respiratory/Chest: CTA Bilaterally without wheezes or rales. Normal effort    GI/Abdomen: BS +, non-tender, non-distended, PEG cdi Ext: no clubbing, cyanosis, or edema Psych: flat, makes eye contact.   Skin: old coccyx wound/scar Neuro:  pt is alert. Makes eye contact.  . Exp>rec word finding deficits, apraxia-remains non-verbal. Right central 7. Poor oral-motor control. Does handle secretions. RUE and RLE 0/5, flaccid. Did not withdraw to pinch on right. Moved left side spontaneously. DTR's 2+ RLE with 2 bts clonus Musculoskeletal:  no pain with PROM.     Assessment/Plan: 1. Functional deficits which require 3+ hours per day of interdisciplinary therapy in a comprehensive inpatient rehab setting. Physiatrist is providing close team supervision and 24 hour management of active medical problems listed below. Physiatrist and rehab team continue to assess barriers to discharge/monitor patient progress toward functional and medical  goals  Care Tool:  Bathing    Body parts bathed by patient: Left arm, Abdomen, Chest, Face   Body parts bathed by helper: Right arm, Front perineal area, Buttocks, Right upper leg, Left upper leg, Right lower leg, Left lower leg     Bathing assist Assist Level: Total Assistance - Patient < 25%     Upper Body Dressing/Undressing Upper body dressing   What is the patient wearing?: Pull over shirt    Upper body assist Assist Level: Total Assistance - Patient < 25%    Lower Body Dressing/Undressing Lower body dressing      What is the patient wearing?: Pants     Lower body assist Assist for lower body dressing: Dependent - Patient 0%     Toileting Toileting    Toileting assist Assist for toileting: Total Assistance - Patient < 25%     Transfers Chair/bed transfer  Transfers assist     Chair/bed transfer assist level: Maximal Assistance - Patient 25 - 49%     Locomotion Ambulation   Ambulation assist      Assist level: 2 helpers Assistive device: Hand held assist (L hall rail) Max distance: 20'   Walk 10 feet activity   Assist  Assist level: 2 helpers Assistive device: Hand held assist (L hall rail)   Walk 50 feet activity   Assist Walk 50 feet with 2 turns activity did not occur: Safety/medical concerns         Walk 150 feet activity   Assist Walk 150 feet activity did not occur: Safety/medical concerns         Walk 10 feet on uneven surface  activity   Assist Walk 10 feet on uneven surfaces activity did not occur: Safety/medical concerns         Wheelchair     Assist Is the patient using a wheelchair?: Yes Type of Wheelchair: Manual    Wheelchair assist level: Dependent - Patient 0% Max wheelchair distance: 150'    Wheelchair 50 feet with 2 turns activity    Assist        Assist Level: Dependent - Patient 0%   Wheelchair 150 feet activity     Assist      Assist Level: Dependent - Patient 0%    Blood pressure (!) 167/94, pulse 82, temperature 97.8 F (36.6 C), resp. rate 16, height 5\' 3"  (1.6 m), weight 51.2 kg, SpO2 98 %. Medical Problem List and Plan: 1.  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  Antithrombotics: -DVT/anticoagulation: Eliquis [employer]: Other (comment)             -antiplatelet therapy: N/A 3. Pain Management: Tylenol as needed 4. Mood: Wellbutrin 75 mg twice daily             -antipsychotic agents: N/A 5. Neuropsych: This patient is not capable of making decisions on her own behalf. 6. Skin/Wound Care: Routine skin checks 7. Fluids/Electrolytes/Nutrition: encourage PO  -BUN sl elevated. Added FW thru tube  -protein for low albumin   -recheck labs Monday  -ate 20-50% meals 9/13, receiving TF after meal if not eating 50% or more   BUN better 9/14 with increased H2O boluses 8.  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 12/03/2020-  -MBS demonstrated improvement in oropharyngeal function with apraxia  -D1 nectars initiated 9/9, meds via PEG 9.  Atrial fibrillation.  Eliquis.  Amiodarone 200 mg daily, Lopressor 12.5 mg twice daily.  Cardiac rate controlled at present 10.  Hyperlipidemia.  Zetia 11.  COPD.Revefenscin nebulizer daily, Brovana as well as Pulmicort  9/14- still on 1L of O2-  wean as tolerated 12.  Acute on chronic diastolic congestive heart failure.     Filed Weights   12/25/20 0400 12/26/20 0318 12/27/20 0500  Weight: 50.1 kg 50.3 kg 51.2 kg    -9/14 weights sl up, monitor, nutritional component to gain  13. HTN:   controlled except for occ DBP elevation  9/14- BP controlled- con't regimen  14. Constipation  9/14- moving bowels, incontinent, more mushy recently    -decrease senna-s to hs  LOS: 6  days A FACE TO FACE EVALUATION WAS PERFORMED  10/14 12/27/2020, 8:24 AM

## 2020-12-28 MED ORDER — PANTOPRAZOLE 2 MG/ML SUSPENSION
40.0000 mg | Freq: Every day | ORAL | Status: DC
  Administered 2020-12-28 – 2021-01-14 (×18): 40 mg
  Filled 2020-12-28 (×14): qty 20

## 2020-12-28 NOTE — Progress Notes (Signed)
Nutrition Follow-up  DOCUMENTATION CODES:   Severe malnutrition in context of chronic illness  INTERVENTION:  Provide Ensure Enlive po BID (thickened to nectar thick consistency), each supplement provides 350 kcal and 20 grams of protein.   Allow pt to eat at meals, if meal completion is less than 50% then provide a bolus tube feed via PEG of Jevity 1.2 cal at volume of 356 ml (1.5 cartons/ARCs) up to TID   Additionally provide a bolus tube feed at HS.    Each bolus feed provides 427 kcal and 20 grams of protein.    Free water flushes of 300 ml QID per tube.   NUTRITION DIAGNOSIS:   Severe Malnutrition related to chronic illness (CHF) as evidenced by severe muscle depletion, severe fat depletion; ongoing  GOAL:   Patient will meet greater than or equal to 90% of their needs; progressing  MONITOR:   PO intake, Supplement acceptance, Diet advancement, Skin, TF tolerance, Weight trends, Labs, I & O's  REASON FOR ASSESSMENT:   Consult Enteral/tube feeding initiation and management  ASSESSMENT:   53-year  female with history of chronic diastolic congestive heart failure, atrial fibrillation maintained on Eliquis, Bell's palsy, hypertension, OSA, prediabetes, right femoral-popliteal bypass graft stenosis followed by vascular surgery, recent hospitalization 8/1-8/24/2022 for 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 Meal completion has been varied from 20-60%. Family at bedside has been encouraging pt po intake. Bolus tube feed given if pt po intake <50% at a meal to ensure adequate nutrition is being met. Pt currently has Ensure ordered to aid in PO intake. Will continue with current orders.   Labs and medications reviewed.   Diet Order:   Diet Order             DIET - DYS 1 Room service appropriate? Yes; Fluid consistency:  Nectar Thick  Diet effective now                   EDUCATION NEEDS:   Not appropriate for education at this time  Skin:  Skin Assessment: Reviewed RN Assessment  Last BM:  9/14  Height:   Ht Readings from Last 1 Encounters:  12/21/20 _0  (1.6 m)    Weight:   Wt Readings from Last 1 Encounters:  12/28/20 51.3 kg   BMI:  Body mass index is 20.03 kg/m.  Estimated Nutritional Needs:   Kcal:  1600-1800  Protein:  80-90 grams  Fluid:  >/= 1.6 L/day  Beverly Parker, MS, RD, LDN RD pager number/after hours weekend pager number on Am[employer].

## 2020-12-28 NOTE — Progress Notes (Signed)
Physical Therapy Weekly Progress Note  Patient Details  Name: Beverly Li MRN: [id number] Date of Birth: [birth year]  Beginning of progress report period: December 22, 2020 End of progress report period: December 28, 2020  Today's Date: 12/28/2020 PT Individual Time: 9450-3888 PT Individual Time Calculation (min): 62 min   Patient has met 1 of 4 short term goals.  Pt is progressing toward mobility goald with improved independence with bed mobility, balance, transfers, and ambulation. Pt has been limited in progress by little to no return of R sided motor function. Ambulation requires total management of R lower extremity at this time. Family ed to be scheduled closer to date of discharge. Coming week to focus on continued NMR of R hemibody, standing balance, transfers, and ambulation.  Patient continues to demonstrate the following deficits muscle weakness, decreased cardiorespiratoy endurance and decreased oxygen support, motor apraxia, decreased coordination, and decreased motor planning, decreased attention to right, and decreased sitting balance, decreased standing balance, decreased postural control, hemiplegia, and decreased balance strategies and therefore will continue to benefit from skilled PT intervention to increase functional independence with mobility.  Patient progressing toward long term goals..  Continue plan of care.  PT Short Term Goals Week 1:  PT Short Term Goal 1 (Week 1): Pt will complete bed mobility with modA. PT Short Term Goal 1 - Progress (Week 1): Progressing toward goal PT Short Term Goal 2 (Week 1): Pt will complete sit to stand transfer with modA +1. PT Short Term Goal 2 - Progress (Week 1): Met PT Short Term Goal 3 (Week 1): Pt will complete bed to chair transfer with modA +1. PT Short Term Goal 3 - Progress (Week 1): Progressing toward goal PT Short Term Goal 4 (Week 1): Pt will ambulate x50' with modA +2. PT Short Term Goal 4 - Progress (Week 1):  Progressing toward goal PT Short Term Goal 5 (Week 1): Pt will complete bed mobility consistently with modA. Week 2:  PT Short Term Goal 1 (Week 2): Pt will complete bed to chair transfers consistently with modA +1. PT Short Term Goal 2 (Week 2): Pt will ambulate 58' with modA +2.  Skilled Therapeutic Interventions/Progress Updates:  Ambulation/gait training;Community reintegration;DME/adaptive equipment instruction;Neuromuscular re-education;Psychosocial support;Stair training;UE/LE Strength taining/ROM;Wheelchair propulsion/positioning;Balance/vestibular training;Discharge planning;Functional electrical stimulation;Pain management;Skin care/wound management;Therapeutic Activities;UE/LE Coordination activities;Cognitive remediation/compensation;Disease management/prevention;Functional mobility training;Patient/family education;Splinting/orthotics;Therapeutic Exercise;Visual/perceptual remediation/compensation   Pt received supine in bed and agrees to therapy. No apparent pain. Supine to sit with maxA and cues for positioning and sequencing. Pt performs stand step transfer to [facility] with maxA and cues for weight shifiting and progression of L foot. WC transport to gym for time management. Pt performs x2 bouts of ambulation x25' each. Pt utilizes hall rails with L hand and PT manages R lower extremity during stance phase to block knee and with progression during swing phase. PT provides external cue for pt to step to with L foot to encourage reciprocal gait pattern. Pt demonstrates much improved gait mechanics in 2nd bout relative to first.  Squat pivot transfer to mat table to the R with modA/maxA. Pt performs sit to supine with maxA and cues for sequencing. In supine, pt performs 3x10 bridges with PT providing manual pressure on distal femurs to encourage WB and provide NM feedback. PT also utilizes gait belt to provide tactile cue/assistance at L glute. Supine to sit with maxA. Squat pivot from mat>WC>bed  with maxA and tactile cues at R knee for stability. Pt left supine with alarm intact and  all needs within reach.  Therapy Documentation Precautions:  Precautions Precautions: Fall, Other (comment) Precaution Comments: R hemiplegia with R shoulder subluxation, PEG, adominal binder; bladder/bowel incontinence Restrictions Weight Bearing Restrictions: No  Therapy/Group: Individual Therapy  Breck Coons, PT, DPT 12/28/2020, 3:17 PM

## 2020-12-28 NOTE — Progress Notes (Signed)
PROGRESS NOTE   Subjective/Complaints:  Had a reasonable night. No pain. Husband reports audible words, language  Review of Systems  Unable to perform ROS: Language . Objective:   No results found. No results for input(s): WBC, HGB, HCT, PLT in the last 72 hours.  Recent Labs    12/27/20 0508  NA 137  K 4.7  CL 99  CO2 31  GLUCOSE 84  BUN 24*  CREATININE 0.68  CALCIUM 8.8*     Intake/Output Summary (Last 24 hours) at 12/28/2020 0926 Last data filed at 12/28/2020 0908 Gross per 24 hour  Intake 120 ml  Output --  Net 120 ml          Physical Exam: Vital Signs Blood pressure 126/81, pulse 64, temperature 98.5 F (36.9 C), resp. rate 16, height 5\' 3"  (1.6 m), weight 51.3 kg, SpO2 97 %.  Constitutional: No distress . Vital signs reviewed. HEENT: NCAT, EOMI, oral membranes moist Neck: supple Cardiovascular: RRR without murmur. No JVD    Respiratory/Chest: CTA Bilaterally without wheezes or rales. Normal effort    GI/Abdomen: BS +, non-tender, non-distended/ PEG site CDI Ext: no clubbing, cyanosis, or edema Psych: pleasant and cooperative. Appears frustrated at times Skin: old coccyx wound/scar Neuro:  pt is alert. Makes eye contact.  . Exp>rec word finding deficits,  apraxia-seems to be close to uttering words. Right central 7 still present. Poor oral-motor control. Does handle secretions. RUE and RLE 0/5, flaccid. Did not withdraw to pinch on right. Moved left side spontaneously. DTR's 2+ RLE with 2 bts clonus still, otherwise easily ranged Musculoskeletal:  no pain with PROM.     Assessment/Plan: 1. Functional deficits which require 3+ hours per day of interdisciplinary therapy in a comprehensive inpatient rehab setting. Physiatrist is providing close team supervision and 24 hour management of active medical problems listed below. Physiatrist and rehab team continue to assess barriers to discharge/monitor  patient progress toward functional and medical goals  Care Tool:  Bathing    Body parts bathed by patient: Left arm, Abdomen, Chest, Face   Body parts bathed by helper: Right arm, Front perineal area, Buttocks, Right upper leg, Left upper leg, Right lower leg, Left lower leg     Bathing assist Assist Level: Total Assistance - Patient < 25%     Upper Body Dressing/Undressing Upper body dressing   What is the patient wearing?: Pull over shirt    Upper body assist Assist Level: Total Assistance - Patient < 25%    Lower Body Dressing/Undressing Lower body dressing      What is the patient wearing?: Pants     Lower body assist Assist for lower body dressing: Dependent - Patient 0%     Toileting Toileting    Toileting assist Assist for toileting: Total Assistance - Patient < 25%     Transfers Chair/bed transfer  Transfers assist     Chair/bed transfer assist level: Maximal Assistance - Patient 25 - 49%     Locomotion Ambulation   Ambulation assist      Assist level: 2 helpers Assistive device: Hand held assist (L hall rail) Max distance: 20'   Walk 10 feet activity  Assist     Assist level: 2 helpers Assistive device: Hand held assist (L hall rail)   Walk 50 feet activity   Assist Walk 50 feet with 2 turns activity did not occur: Safety/medical concerns         Walk 150 feet activity   Assist Walk 150 feet activity did not occur: Safety/medical concerns         Walk 10 feet on uneven surface  activity   Assist Walk 10 feet on uneven surfaces activity did not occur: Safety/medical concerns         Wheelchair     Assist Is the patient using a wheelchair?: Yes Type of Wheelchair: Manual    Wheelchair assist level: Dependent - Patient 0% Max wheelchair distance: 150'    Wheelchair 50 feet with 2 turns activity    Assist        Assist Level: Dependent - Patient 0%   Wheelchair 150 feet activity      Assist      Assist Level: Dependent - Patient 0%   Blood pressure 126/81, pulse 64, temperature 98.5 F (36.9 C), resp. rate 16, height 5\' 3"  (1.6 m), weight 51.3 kg, SpO2 97 %. Medical Problem List and Plan: 1.  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  Antithrombotics: -DVT/anticoagulation: Eliquis [employer]: Other (comment)             -antiplatelet therapy: N/A 3. Pain Management: Tylenol as needed 4. Mood: Wellbutrin 75 mg twice daily             -antipsychotic agents: N/A 5. Neuropsych: This patient is not capable of making decisions on her own behalf. 6. Skin/Wound Care: Routine skin checks 7. Fluids/Electrolytes/Nutrition: encourage PO  -BUN sl elevated. Added FW thru tube  -protein for low albumin   -recheck labs Monday  -ate 20-60% meals currently. receiving TF after meal if not eating 50% or more   BUN better 9/14 with increased H2O boluses--recheck Monday 8.  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 12/03/2020-  -MBS demonstrated improvement in oropharyngeal function with apraxia  -D1 nectars initiated 9/9, meds via PEG 9.  Atrial fibrillation.  Eliquis.  Amiodarone 200 mg daily, Lopressor 12.5 mg twice daily.  Cardiac rate controlled at present 10.  Hyperlipidemia.  Zetia 11.  COPD.Revefenscin nebulizer daily, Brovana as well as Pulmicort  9/15- still on 1L of O2-  wean as tolerated 12.  Acute on chronic diastolic congestive heart failure.     Filed Weights   12/26/20 0318 12/27/20 0500 12/28/20 0300  Weight: 50.3 kg 51.2 kg 51.3 kg    -9/15 weights sl up,stable 13. HTN:   controlled except for occ DBP elevation  9/15- BP controlled- con't regimen  14. Constipation  9/15- observe with decreased  senna-s  LOS: 7 days A FACE TO FACE EVALUATION WAS PERFORMED  10/15 12/28/2020, 9:26 AM

## 2020-12-28 NOTE — Progress Notes (Signed)
Occupational Therapy Session Note  Patient Details  Name: Beverly Li MRN: [id number] Date of Birth: 03-17-1946  Today's Date: 12/28/2020 OT Individual Time: 4742-5956 OT Individual Time Calculation (min): 73 min   Short Term Goals: Week 1:  OT Short Term Goal 1 (Week 1): Patient will maintain sitting balance with mod A during functional BADL task OT Short Term Goal 2 (Week 1): Patient will perofrm sit<>stand at the sink with mod A in preparation for BADL task OT Short Term Goal 3 (Week 1): Patient will complete 1 step of UB dressing task with mod questioning cues.  Skilled Therapeutic Interventions/Progress Updates:    Pt greeted semi-reclined in bed with family present. Pt noted to be incontinent of urine and BM. Max A sit<>stand in [place] to transfer onto toilet. Pt unable to void or have more BM in comoode. Total A for peri-care in standing in [place]. Stedy transfer to cut out shower bench. Pt with poor sitting balance in shower requiring mod/max A to maintain sitting during shower. Pt was able to assist with washing peri-area and abdomen. Worked on dressing strategies at the sink and R body awareness with hemi-techniques. Sit<>stands at the sink with max A while OT assist with pulling up pants. Discussed OT goals and POC with family as well. OT placed SAEBO e-stim on shoulder. SAEBO left on for 60 minutes. OT returned to remove SAEBO with skin intact and no adverse reactions.  Saebo Stim One 330 pulse width 35 Hz pulse rate On 8 sec/ off 8 sec Ramp up/ down 2 sec Symmetrical Biphasic wave form  Max intensity at 500 Ohm load  Therapy Documentation Precautions:  Precautions Precautions: Fall, Other (comment) Precaution Comments: R hemiplegia with R shoulder subluxation, PEG, adominal binder; bladder/bowel incontinence Restrictions Weight Bearing Restrictions: No Pain:  Unable to report pain, repositioned for comfort   Therapy/Group: Individual Therapy  Mal Amabile 12/28/2020, 10:51 AM

## 2020-12-28 NOTE — Progress Notes (Signed)
Speech Language Pathology Daily Session Note  Patient Details  Name: Beverly Li MRN: [id number] Date of Birth: 1946-03-11  Today's Date: 12/28/2020 SLP Individual Time: 1300-1400 SLP Individual Time Calculation (min): 60 min  Short Term Goals: Week 1: SLP Short Term Goal 1 (Week 1): Patient will consume current diet with minimal overt s/s of aspiration with Min verba and visual cues for use of swallowing compensatory strategies. SLP Short Term Goal 2 (Week 1): Patient will consume thin liquids with minimal overt s/s of aspiration with Min verbal cues for use of small sips via cup. SLP Short Term Goal 3 (Week 1): Patient will answer yes/no questions utilizing multimodal communication with 25% accuracy and Max A multimodal cues. SLP Short Term Goal 4 (Week 1): Patient will vocalize on command in 25% of opportunities with Max A multimodal cues.  Skilled Therapeutic Interventions:   Patient seen with spouse and other family member present for skilled ST session focusing on expressive and receptive language abilities. Of note, spouse reported that she has started speaking but will start a phrase but not be able to finish, such as "I want to......". SLP assisted [facility] She was alert and able to participate fully throughout session. She had significant difficulty in attempts to imitate SLP to produce phonemes (SLP wearing clear mask) but was able to imitate some oral motor movements (tongue stick out, rounded lips, etc). After initially requiring mod-maxA tactile and verbal cues to point with index finger to object photos in field of 2, she then improved with initiating pointing gesture and advanced to identify object photos when named and when function described in field of 4. Field of 6 was too inconsistent and appeared to be visually overstimulating to patient. She then was able to name (with phonemic errors), approximately 15 different object photos when given  partial phrase and/or initial phoneme cues and was able to name approximately 5-7 without cues. Patient left in bed with needs within reach and spouse present in room. She continues to benefit from skilled SLP intervention to maximize cognitive-linguistic and swallow function prior to discharge.  Pain Pain Assessment Pain Scale: Faces Pain Score: 0-No pain Faces Pain Scale: Hurts a little bit Pain Type: Acute pain Pain Location: Other (Comment) (difficult to determine, spouse thinks she is having some discomfort after eating) Pain Descriptors / Indicators: Discomfort;Grimacing Pain Onset: Gradual Pain Intervention(s): RN made aware  Therapy/Group: Individual Therapy  Angela Nevin, MA, CCC-SLP Speech Therapy

## 2020-12-29 NOTE — Progress Notes (Signed)
Occupational Therapy Session Note  Patient Details  Name: Beverly Li MRN: [id number] Date of Birth: 30-Dec-1945  Today's Date: 12/29/2020 OT Individual Time: 1300-1345 OT Individual Time Calculation (min): 45 min    Short Term Goals: Week 2:  OT Short Term Goal 1 (Week 2): Patient will perofrm sit<>stand at the sink with mod A in preparation for BADL task OT Short Term Goal 2 (Week 2): Patient will perform self ROM of R UE with mod intructional cues OT Short Term Goal 3 (Week 2): Patient will tolerate standing for 2 minutes in preparation for BADL task  Skilled Therapeutic Interventions/Progress Updates:    Pt greeted seated in wc and seemed more fidgety than normal. OT handed pt  "yes/No" board and asked if pt needed to go to the bathroom in which pt indicated "yes." Stedy used for bathroom transfer as no +2 available and Stedy would block R LE and assist with balance so OT can assist with peri-care and clothing management. PT with successful CONTINENT void of bowel and bladder! Mod A for stands in stedy while OT provided total A for peri-care and clothing management. Pt brought to therapy gym and worked on [employer] with large images and words up to 3. Pt with 50% accuracy with 2 letter/words, but then had more diffiuclty with three words. Pt returned to room and completed tsand-pivot back to bed with max A of 1. Pt left semi-reclined in bed with bed alarm on, nursing present, and needs met.   Therapy Documentation Precautions:  Precautions Precautions: Fall, Other (comment) Precaution Comments: R hemiplegia with R shoulder subluxation, PEG, adominal binder; bladder/bowel incontinence Restrictions Weight Bearing Restrictions: No  Pain:  Denies pain when asked with yes/no board   Therapy/Group: Individual Therapy  Valma Cava 12/29/2020, 1:56 PM

## 2020-12-29 NOTE — Progress Notes (Signed)
PROGRESS NOTE   Subjective/Complaints:  Up at eob with therapy. Didn't sleep much by report. Incontinent of urine and stool.  Review of Systems  Unable to perform ROS: Language . Objective:   No results found. No results for input(s): WBC, HGB, HCT, PLT in the last 72 hours.  Recent Labs    12/27/20 0508  NA 137  K 4.7  CL 99  CO2 31  GLUCOSE 84  BUN 24*  CREATININE 0.68  CALCIUM 8.8*     Intake/Output Summary (Last 24 hours) at 12/29/2020 0911 Last data filed at 12/29/2020 0838 Gross per 24 hour  Intake 420 ml  Output --  Net 420 ml          Physical Exam: Vital Signs Blood pressure 125/86, pulse 83, temperature (!) 97.5 F (36.4 C), temperature source Oral, resp. rate 17, height 5\' 3"  (1.6 m), weight 50 kg, SpO2 96 %.  Constitutional: No distress . Vital signs reviewed. HEENT: NCAT, EOMI, oral membranes moist Neck: supple Cardiovascular: RRR without murmur. No JVD    Respiratory/Chest: CTA Bilaterally without wheezes or rales. Normal effort    GI/Abdomen: BS +, non-tender, non-distended Ext: no clubbing, cyanosis, or edema Psych: pleasant and cooperative  Skin: old coccyx wound/scar Neuro:  pt is alert. Makes eye contact.  . Exp>rec word finding deficits,  apraxia-occasionally makes sounds, but no words, for me.  Right central 7 still present. Poor oral-motor control. Does handle secretions. RUE and RLE 0/5, flaccid. Did not withdraw to pinch on right. Moved left side spontaneously. DTR's 2+ RLE with 2 bts clonus still, otherwise easily ranged Musculoskeletal:  no jt or trunk pain    Assessment/Plan: 1. Functional deficits which require 3+ hours per day of interdisciplinary therapy in a comprehensive inpatient rehab setting. Physiatrist is providing close team supervision and 24 hour management of active medical problems listed below. Physiatrist and rehab team continue to assess barriers to  discharge/monitor patient progress toward functional and medical goals  Care Tool:  Bathing    Body parts bathed by patient: Left arm, Abdomen, Chest, Face   Body parts bathed by helper: Right arm, Front perineal area, Buttocks, Right upper leg, Left upper leg, Right lower leg, Left lower leg     Bathing assist Assist Level: Total Assistance - Patient < 25%     Upper Body Dressing/Undressing Upper body dressing   What is the patient wearing?: Pull over shirt    Upper body assist Assist Level: Total Assistance - Patient < 25%    Lower Body Dressing/Undressing Lower body dressing      What is the patient wearing?: Pants     Lower body assist Assist for lower body dressing: Dependent - Patient 0%     Toileting Toileting    Toileting assist Assist for toileting: Total Assistance - Patient < 25%     Transfers Chair/bed transfer  Transfers assist     Chair/bed transfer assist level: Maximal Assistance - Patient 25 - 49%     Locomotion Ambulation   Ambulation assist      Assist level: 2 helpers Assistive device: Hand held assist (L hall rail) Max distance: 20'   Walk 10  feet activity   Assist     Assist level: 2 helpers Assistive device: Hand held assist (L hall rail)   Walk 50 feet activity   Assist Walk 50 feet with 2 turns activity did not occur: Safety/medical concerns         Walk 150 feet activity   Assist Walk 150 feet activity did not occur: Safety/medical concerns         Walk 10 feet on uneven surface  activity   Assist Walk 10 feet on uneven surfaces activity did not occur: Safety/medical concerns         Wheelchair     Assist Is the patient using a wheelchair?: Yes Type of Wheelchair: Manual    Wheelchair assist level: Dependent - Patient 0% Max wheelchair distance: 150'    Wheelchair 50 feet with 2 turns activity    Assist        Assist Level: Dependent - Patient 0%   Wheelchair 150 feet  activity     Assist      Assist Level: Dependent - Patient 0%   Blood pressure 125/86, pulse 83, temperature (!) 97.5 F (36.4 C), temperature source Oral, resp. rate 17, height 5\' 3"  (1.6 m), weight 50 kg, SpO2 96 %. Medical Problem List and Plan: 1.  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  Antithrombotics: -DVT/anticoagulation: [employer]: Other (comment)             -antiplatelet therapy: N/A 3. Pain Management: Tylenol as needed 4. Mood: Wellbutrin 75 mg twice daily             -antipsychotic agents: N/A 5. Neuropsych: This patient is not capable of making decisions on her own behalf. 6. Skin/Wound Care: Routine skin checks 7. Fluids/Electrolytes/Nutrition: encourage PO  -BUN sl elevated. Added FW thru tube  -protein for low albumin   -9/16recheck labs Monday 9/19   -intake generally improving.  receiving TF after meal if not eating 50% or more  8.  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 12/03/2020-  -MBS demonstrated improvement in oropharyngeal function with apraxia  -D1 nectars initiated 9/9, meds via PEG 9.  Atrial fibrillation.  Eliquis.  Amiodarone 200 mg daily, Lopressor 12.5 mg twice daily.  Cardiac rate controlled at present 10.  Hyperlipidemia.  Zetia 11.  COPD.Revefenscin nebulizer daily, Brovana as well as Pulmicort  9/16- still on 1L of O2-  discussed weaning with therapy and nursing 12.  Acute on chronic diastolic congestive heart failure.     Filed Weights   12/27/20 0500 12/28/20 0300 12/29/20 0500  Weight: 51.2 kg 51.3 kg 50 kg    -9/15 weights stable 13. HTN:   controlled except for occ DBP elevation  9/16- BP controlled- con't regimen  14. Constipation  9/16- stools still mushy--hold  miralax, continue senna-s at hs  LOS: 8 days A FACE TO FACE EVALUATION WAS PERFORMED  10/16 12/29/2020, 9:11 AM

## 2020-12-29 NOTE — Progress Notes (Signed)
Patient awake most of the shift, falling asleep in the last few hours of the shift. Incontinent of bowel and bladder this shift. Turned and repositioned throughout the night. Compliant with splint and prafo boot. No s/sx of pain or discomfort.

## 2020-12-29 NOTE — Progress Notes (Signed)
Physical Therapy Session Note  Patient Details  Name: Beverly Li MRN: [id number] Date of Birth: 29-Sep-1945  Today's Date: 12/29/2020 PT Individual Time: 3335-4562 PT Individual Time Calculation (min): 45 min   Short Term Goals: Week 1:  PT Short Term Goal 1 (Week 1): Pt will complete bed mobility with modA. PT Short Term Goal 1 - Progress (Week 1): Progressing toward goal PT Short Term Goal 2 (Week 1): Pt will complete sit to stand transfer with modA +1. PT Short Term Goal 2 - Progress (Week 1): Met PT Short Term Goal 3 (Week 1): Pt will complete bed to chair transfer with modA +1. PT Short Term Goal 3 - Progress (Week 1): Progressing toward goal PT Short Term Goal 4 (Week 1): Pt will ambulate x50' with modA +2. PT Short Term Goal 4 - Progress (Week 1): Progressing toward goal PT Short Term Goal 5 (Week 1): Pt will complete bed mobility consistently with modA. Week 2:  PT Short Term Goal 1 (Week 2): Pt will complete bed to chair transfers consistently with modA +1. PT Short Term Goal 2 (Week 2): Pt will ambulate 97' with modA +2. Week 3:     Skilled Therapeutic Interventions/Progress Updates:   Pt initially supine and finishing breakfast.  Pt performs rolling and bridgning w/mod assist for brief change, perineal bathing on R mod assist/L w/setup only, and lower body dressing overall mod assist.  Supine to sidie to sit w/mod assist and max cues. Mod assist for scooting in sitting, leans post/R Sitting balance w/cga to min assist and heavy cueing to correct post R lean/tendency.  Upper body dressing w/mod assist and worked on reaching ant/L w/improved midline following. Sit to stand from bed w/mod assist. Gait 37f, 139fw/max of 1, min HHA of 1 (max assist for RLE advancement/stabilization but some tone in limb helps overall w/stability)  Pt maintains 02sats 99% on 2L, decreased to 1L per discussion w/MD and nursing notified/agreed to continue monitoring. At end of session,Pt left oob  in wc w/alarm belt set and needs in reach and husband in room. Husband educated on progress in todays session.    Therapy Documentation Precautions:  Precautions Precautions: Fall, Other (comment) Precaution Comments: R hemiplegia with R shoulder subluxation, PEG, adominal binder; bladder/bowel incontinence Restrictions Weight Bearing Restrictions: No     Therapy/Group: Individual Therapy [place]ie FieldingPT[place]/16/2022, 12:45 PM

## 2020-12-29 NOTE — Progress Notes (Signed)
Occupational Therapy Weekly Progress Note  Patient Details  Name: Beverly Li MRN: [id number] Date of Birth: 01-05-1946  Beginning of progress report period: December 22, 2020 End of progress report period: December 29, 2020  Today's Date: 12/29/2020 OT Individual Time: 6979-4801 OT Individual Time Calculation (min): 60 min    Patient has met 2 of 3 short term goals.  Patient is making progress towards OT goals. She has demonstrated improved sitting balance and postural control within BADL tasks. Stand-pivot transfers are improving, but she still needs max A of 1 person for functional transfers. We have used the [facility] for functional shower and toilet transfers so she can be a 1 person assist due to complexity of toileting. R UE continues to be flaccid.   Patient continues to demonstrate the following deficits: muscle weakness, impaired timing and sequencing, abnormal tone, unbalanced muscle activation, motor apraxia, decreased coordination, and decreased motor planning, decreased midline orientation, decreased attention to right, right side neglect, and decreased motor planning, decreased initiation, decreased attention, decreased awareness, decreased problem solving, decreased safety awareness, decreased memory, and delayed processing, and decreased sitting balance, decreased standing balance, hemiplegia, and decreased balance strategies and therefore will continue to benefit from skilled OT intervention to enhance overall performance with BADL and Reduce care partner burden.  Patient progressing toward long term goals..  Continue plan of care.  OT Short Term Goals Week 1:  OT Short Term Goal 1 (Week 1): Patient will maintain sitting balance with mod A during functional BADL task OT Short Term Goal 1 - Progress (Week 1): Met OT Short Term Goal 2 (Week 1): Patient will perofrm sit<>stand at the sink with mod A in preparation for BADL task OT Short Term Goal 2 - Progress (Week 1):  Progressing toward goal OT Short Term Goal 3 (Week 1): Patient will complete 1 step of UB dressing task with mod questioning cues. OT Short Term Goal 3 - Progress (Week 1): Met Week 2:  OT Short Term Goal 1 (Week 2): Patient will perofrm sit<>stand at the sink with mod A in preparation for BADL task OT Short Term Goal 2 (Week 2): Patient will perform self ROM of R UE with mod intructional cues OT Short Term Goal 3 (Week 2): Patient will tolerate standing for 2 minutes in preparation for BADL task  Skilled Therapeutic Interventions/Progress Updates:    Patient greeted seated in wc and already had clean clothes on. Patient taken to therapy gym and completed stand-pivot to therapy mat with max A of 1 person. Addressed sitting balance, R UE weight bearing for neuro re-ed and card matching. Facilitated trunk elongation and posterior pelvic tilt using NDT techniques. Pt able to match 75% of cards with min questioning cues. R UE NMR with OT providing joint input through wrist and elbow to bring pt through full ROM. No muscle activation noted. Addressed self-ROM with pt needing hand over hand to initiate position, then had her reach towards numbers 1-3 in mirror. Pt able to recall 2 number patterns, but had a little more difficulty recalling 3. Pt returned to room and left seated in wc with family present.  OT placed SAEBO e-stim and left on patient. SAEBO left on for 60 minutes. OT returned to remove SAEBO with skin intact and no adverse reactions.  Saebo Stim One 330 pulse width 35 Hz pulse rate On 8 sec/ off 8 sec Ramp up/ down 2 sec Symmetrical Biphasic wave form  Max intensity 159m at 500 Ohm load  Therapy Documentation Precautions:  Precautions Precautions: Fall, Other (comment) Precaution Comments: R hemiplegia with R shoulder subluxation, PEG, adominal binder; bladder/bowel incontinence Restrictions Weight Bearing Restrictions: No Pain: Pain Assessment Faces Pain Scale: Hurts little  more Pain Location: Other (Comment) Pain Orientation:  (unable to tell)   Therapy/Group: Individual Therapy  Valma Cava 12/29/2020, 12:33 PM

## 2020-12-29 NOTE — Progress Notes (Signed)
Speech Language Pathology Weekly Progress and Session Note  Patient Details  Name: ZAKAIYA LARES MRN: [id number] Date of Birth: 01/08/1946  Beginning of progress report period: December 22, 2020 End of progress report period: December 29, 2020  Today's Date: 12/29/2020 SLP Individual Time: 4481-8563 SLP Individual Time Calculation (min): 55 min  Short Term Goals: Week 1: SLP Short Term Goal 1 (Week 1): Patient will consume current diet with minimal overt s/s of aspiration with Min verba and visual cues for use of swallowing compensatory strategies. SLP Short Term Goal 1 - Progress (Week 1): Met SLP Short Term Goal 2 (Week 1): Patient will consume thin liquids with minimal overt s/s of aspiration with Min verbal cues for use of small sips via cup. SLP Short Term Goal 2 - Progress (Week 1): Not met SLP Short Term Goal 3 (Week 1): Patient will answer yes/no questions utilizing multimodal communication with 25% accuracy and Max A multimodal cues. SLP Short Term Goal 3 - Progress (Week 1): Met SLP Short Term Goal 4 (Week 1): Patient will vocalize on command in 25% of opportunities with Max A multimodal cues. SLP Short Term Goal 4 - Progress (Week 1): Met    New Short Term Goals: Week 2: SLP Short Term Goal 1 (Week 2): Patient will consume thin liquids with minimal overt s/s of aspiration with Min verbal cues for use of small sips via cup. SLP Short Term Goal 2 (Week 2): Patient will demonstrate efficient mastication and complete oral clearance with Min verbal cues with trials of Dys. 2 textures without overt s/s of aspiration over 2 sessions prior to upgrade. SLP Short Term Goal 3 (Week 2): Patient will read aloud at the word level with 75% accuracy and Mod multimodal cues. SLP Short Term Goal 4 (Week 2): Patient will utilize her communication board to express wants/needs in 50% of opportunities with Mod multimodal cues. SLP Short Term Goal 5 (Week 2): Patient will write at the word level  with 25% accuracy and Max A multimodal cues. SLP Short Term Goal 6 (Week 2): Patient will demonstrate reading comprehension at the phrase level with 50% accuracy and Mod verbal cues.  Weekly Progress Updates: Patient has made excellent gains and has met 3 of 4 STGs this reporting period. Currently, patient is consuming Dys. 1 textures with nectar-thick liquids with minimal overt s/s of aspiration and Min verbal cues for use of swallowing compensatory strategies. Patient has been consuming trials of thin liquids with intermittent overt s/s of aspiration, therefore, recommend ongoing trials with SLP only.  Patient demonstrates improved ability to functionally communicate wants/needs with use of a communication board and can verbalize intermittently at the word level. Patient also demonstrates improved auditory comprehension of basic information. Patient and family education ongoing. Patient would benefit from continued skilled SLP intervention to maximize her cognitive-linguistic and swallowing function prior to discharge.      Intensity: Minumum of 1-2 x/day, 30 to 90 minutes Frequency: 3 to 5 out of 7 days Duration/Length of Stay: 10/7 Treatment/Interventions: Cognitive remediation/compensation;Dysphagia/aspiration precaution training;Speech/Language facilitation;Therapeutic Activities;Environmental controls;Cueing hierarchy;Functional tasks;Patient/family education   Daily Session  Skilled Therapeutic Interventions:  Skilled treatment session focused on communication goals. SLP facilitated session by providing Mod A written and sentence completion cues for patient to name 11 functional objects at the word level. Patient appeared to generate functional words with the initial phonemes /p/, /b/, /m/, and /h/. Despite Max A multimodal cues, patient unable to produce words with initial /s/ or /t or perform  automatic sequences. Patient utilized the communication board to answer mildly abstract yes/no  questions appropriately. Patient's husband present and educated on how to help facilitate functional communication. Patient left upright in wheelchair with husband present. Continue with current plan of care.     Pain No/Denies Pain   Therapy/Group: Individual Therapy  Ibtisam Benge 12/29/2020, 6:24 AM

## 2020-12-30 DIAGNOSIS — I5033 Acute on chronic diastolic (congestive) heart failure: Secondary | ICD-10-CM

## 2020-12-30 DIAGNOSIS — I10 Essential (primary) hypertension: Secondary | ICD-10-CM

## 2020-12-30 DIAGNOSIS — I69391 Dysphagia following cerebral infarction: Secondary | ICD-10-CM

## 2020-12-30 NOTE — Progress Notes (Signed)
PROGRESS NOTE   Subjective/Complaints: Patient seen laying in bed this morning.  She indicates she slept well overnight.  No reported issues overnight.  Husband at bedside.  He has questions regarding abdominal binder.  Review of Systems  Unable to perform ROS: Language . Objective:   No results found. No results for input(s): WBC, HGB, HCT, PLT in the last 72 hours.  No results for input(s): NA, K, CL, CO2, GLUCOSE, BUN, CREATININE, CALCIUM in the last 72 hours.   Intake/Output Summary (Last 24 hours) at 12/30/2020 0830 Last data filed at 12/29/2020 1757 Gross per 24 hour  Intake 510 ml  Output --  Net 510 ml          Physical Exam: Vital Signs Blood pressure (!) 146/88, pulse 65, temperature 97.6 F (36.4 C), temperature source Oral, resp. rate 14, height 5\' 3"  (1.6 m), weight 50 kg, SpO2 96 %. Constitutional: No distress . Vital signs reviewed. HENT: Normocephalic.  Atraumatic. Eyes: EOMI. No discharge. Cardiovascular: No JVD.  Irregularly irregular. Respiratory: Normal effort.  No stridor.  Bilateral clear to auscultation.  + [place]. GI: Non-distended.  BS +.  + PEG. Skin: Warm and dry.  Intact. Psych: Normal mood.  Normal behavior. Musc: No edema in extremities.  No tenderness in extremities. Neuro: Alert Makes eye contact.   Exp>>rec word finding deficits,  apraxia Motor: RUE/RLE: 0/5 proximal distal Sensation diminished to light touch right side   Assessment/Plan: 1. Functional deficits which require 3+ hours per day of interdisciplinary therapy in a comprehensive inpatient rehab setting. Physiatrist is providing close team supervision and 24 hour management of active medical problems listed below. Physiatrist and rehab team continue to assess barriers to discharge/monitor patient progress toward functional and medical goals  Care Tool:  Bathing    Body parts bathed by patient: Left arm, Abdomen, Chest,  Face   Body parts bathed by helper: Right arm, Front perineal area, Buttocks, Right upper leg, Left upper leg, Right lower leg, Left lower leg     Bathing assist Assist Level: Total Assistance - Patient < 25%     Upper Body Dressing/Undressing Upper body dressing   What is the patient wearing?: Pull over shirt    Upper body assist Assist Level: Total Assistance - Patient < 25%    Lower Body Dressing/Undressing Lower body dressing      What is the patient wearing?: Pants     Lower body assist Assist for lower body dressing: Dependent - Patient 0%     Toileting Toileting    Toileting assist Assist for toileting: Total Assistance - Patient < 25%     Transfers Chair/bed transfer  Transfers assist     Chair/bed transfer assist level: Maximal Assistance - Patient 25 - 49%     Locomotion Ambulation   Ambulation assist      Assist level: 2 helpers Assistive device: Hand held assist (L hall rail) Max distance: 20'   Walk 10 feet activity   Assist     Assist level: 2 helpers Assistive device: Hand held assist (L hall rail)   Walk 50 feet activity   Assist Walk 50 feet with 2 turns activity did not  occur: Safety/medical concerns         Walk 150 feet activity   Assist Walk 150 feet activity did not occur: Safety/medical concerns         Walk 10 feet on uneven surface  activity   Assist Walk 10 feet on uneven surfaces activity did not occur: Safety/medical concerns         Wheelchair     Assist Is the patient using a wheelchair?: Yes Type of Wheelchair: Manual    Wheelchair assist level: Dependent - Patient 0% Max wheelchair distance: 150'    Wheelchair 50 feet with 2 turns activity    Assist        Assist Level: Dependent - Patient 0%   Wheelchair 150 feet activity     Assist      Assist Level: Dependent - Patient 0%   Blood pressure (!) 146/88, pulse 65, temperature 97.6 F (36.4 C), temperature source  Oral, resp. rate 14, height 5\' 3"  (1.6 m), weight 50 kg, SpO2 96 %. Medical Problem List and Plan: 1.  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  Antithrombotics: -DVT/anticoagulation: Eliquis Pharmaceutical: Other (comment)             -antiplatelet therapy: N/A 3. Pain Management: Tylenol as needed 4. Mood: Wellbutrin 75 mg twice daily             -antipsychotic agents: N/A 5. Neuropsych: This patient is not capable of making decisions on her own behalf. 6. Skin/Wound Care: Routine skin checks 7. Fluids/Electrolytes/Nutrition: encourage PO  -BUN sl elevated. Added FW thru tube  -protein for low albumin  Receiving TFs after meal if not eating 50% or more 8.  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 12/03/2020-   -MBS demonstrated improvement in oropharyngeal function with apraxia  -D1 nectars initiated 9/9, meds via PEG 9.  Atrial fibrillation.  Eliquis.  Amiodarone 200 mg daily, Lopressor 12.5 mg twice daily.  Cardiac rate controlled at present 10.  Hyperlipidemia.  Zetia 11.  COPD.Revefenscin nebulizer daily, Brovana as well as Pulmicort  Continues to require supplemental oxygen on 9/17  Weaned to room air as tolerated 12.  Acute on chronic diastolic congestive heart failure.     Filed Weights   12/28/20 0300 12/29/20 0500 12/30/20 0500  Weight: 51.3 kg 50 kg 50 kg    Stable 9/17 13. HTN:   controlled except for occ DBP elevation  Relatively controlled on 9/17  14. Constipation  9/16- stools still mushy--hold miralax, continue senna-s at hs  LOS: 9 days A FACE TO FACE EVALUATION WAS PERFORMED  Jabin Tapp 10/16 12/30/2020, 8:30 AM

## 2020-12-30 NOTE — Progress Notes (Signed)
Physical Therapy Session Note  Patient Details  Name: Beverly Li MRN: [id number] Date of Birth: [birth year]  Today's Date: 12/30/2020 PT Individual Time: 1000-1100  and 1500-1540 PT Individual Time Calculation (min): 60 min and 40 min  Short Term Goals: Week 1:  PT Short Term Goal 1 (Week 1): Pt will complete bed mobility with modA. PT Short Term Goal 1 - Progress (Week 1): Progressing toward goal PT Short Term Goal 2 (Week 1): Pt will complete sit to stand transfer with modA +1. PT Short Term Goal 2 - Progress (Week 1): Met PT Short Term Goal 3 (Week 1): Pt will complete bed to chair transfer with modA +1. PT Short Term Goal 3 - Progress (Week 1): Progressing toward goal PT Short Term Goal 4 (Week 1): Pt will ambulate x50' with modA +2. PT Short Term Goal 4 - Progress (Week 1): Progressing toward goal PT Short Term Goal 5 (Week 1): Pt will complete bed mobility consistently with modA. Week 2:  PT Short Term Goal 1 (Week 2): Pt will complete bed to chair transfers consistently with modA +1. PT Short Term Goal 2 (Week 2): Pt will ambulate 54' with modA +2. Week 3:     Skilled Therapeutic Interventions/Progress Updates:   AM SESSION PAIN pt denies pain  Dons pants w/mod assist, cues to attend to RLE while therapist lifts legs and threads pants, pt w/improved ability to bridge today while raising pants over hips.  Pt able to adduct/IR R hip in bridge position x 10 when resistance applied to LLE. Supine to side to sit w/max cues for sequencing, mod assist to complete using bedrails.  Worked on sitting balance/midline orientation via reaching to L.  Worked on sequencing w/scooting hips. Sit to stand w/mod assist, max cues for sequencing, husband assisting on L/instructed by therapist.   Gait 17f to wc w/husband HHA on L, therapist facilitating advancement and stabilization on R, RUE in sling using gait belt.  Standing tolerance x 123m while engaging in giant checkers game, therapist  stabilizing RLE at hip/knee, UE in wbing support on table, pt w/much difficulty w/strategy but worked on visual scanning and reaching for checkers to promote wt shifting L/R/L Gait 2012f/HHA on L, max assist on R for advancement LLE, stabilization at hip/knee  02 sats 89-94 on RA   PM SESSION Pain - pt indicated pain in stomach using Y/N board for communication.  Declined commode transfer, husband stated she just had BM.  Treatment to tolerance.  Repositioning as needed.  Pt initially supine, Lower body dressing performed as described in am session.  Supine to side to sit w/cues for sequencing, mod assist to complete.  Once sitting able to demonstrate improved midline w/verbal cues for correction. Sit to stand w/max cueing and assist, manual stab of knee to prevent buckling/HE.  Stand pivot max assist due to poor motor planning of pt.  Wc to mat stand pivot w/mod assist.  Attempted dangling Les and tapping to RLE to facilitate quad activation w/no success. Sit to supine w/mod assist.  Bridgnig, R hip IR/ADD w/overpressure to L performed.  Attempted heel slides and hip abd/add first w/LLE then w/RLE using sliding board pt unable to activate MM w/these. Supine to side to sit as above.  stand pivot transfer to wc w/mod assist as above.  Pt transfported to room.  Pt left oob in wc w/alarm belt set and needs in reach 02 sats remain 90s on RA       Therapy  Documentation Precautions:  Precautions Precautions: Fall, Other (comment) Precaution Comments: R hemiplegia with R shoulder subluxation, PEG, adominal binder; bladder/bowel incontinence Restrictions Weight Bearing Restrictions: No    Therapy/Group: Individual Therapy Callie Fielding, [place] 12/30/2020, 4:07 PM

## 2020-12-30 NOTE — Plan of Care (Signed)
  Problem: RH BOWEL ELIMINATION Goal: RH STG MANAGE BOWEL WITH ASSISTANCE Description: STG Manage Bowel with min Assistance. Outcome: Not Progressing; incontinence   Problem: RH BLADDER ELIMINATION Goal: RH STG MANAGE BLADDER WITH ASSISTANCE Description: STG Manage Bladder With min Assistance Outcome: Not Progressing; incontinence   Problem: RH KNOWLEDGE DEFICIT GENERAL Goal: RH STG INCREASE KNOWLEDGE OF SELF CARE AFTER HOSPITALIZATION Description: Patient's family will be able to manage care and nutritional means using handouts and educational resources independently Outcome: Not Progressing; global aphasia   

## 2020-12-30 NOTE — Progress Notes (Signed)
Patient's spouse demonstrated ability to flush peg tube and administer medications to gravity with no verbal cues. May need opportunity to crush medication during next administration to complete process.

## 2020-12-31 MED ORDER — HYDRALAZINE HCL 10 MG PO TABS
10.0000 mg | ORAL_TABLET | ORAL | Status: DC | PRN
  Administered 2021-01-05 – 2021-01-14 (×7): 10 mg
  Filled 2020-12-31 (×8): qty 1

## 2020-12-31 NOTE — Progress Notes (Signed)
PROGRESS NOTE   Subjective/Complaints: Patient seen laying in bed this morning.  She states she did not sleep well overnight, but cannot elaborate.  Husband at bedside.  He asks about getting patient up today.  Husband noticed patient had some sensations/discomfort when removing orthoses this AM.  Review of Systems  Unable to perform ROS: Language , unchanged Objective:   No results found. No results for input(s): WBC, HGB, HCT, PLT in the last 72 hours.  No results for input(s): NA, K, CL, CO2, GLUCOSE, BUN, CREATININE, CALCIUM in the last 72 hours.   Intake/Output Summary (Last 24 hours) at 12/31/2020 0949 Last data filed at 12/31/2020 0906 Gross per 24 hour  Intake 354 ml  Output --  Net 354 ml          Physical Exam: Vital Signs Blood pressure (!) 163/93, pulse 76, temperature 97.9 F (36.6 C), temperature source Oral, resp. rate 16, height 5\' 3"  (1.6 m), weight 50 kg, SpO2 94 %. Constitutional: No distress . Vital signs reviewed. HENT: Normocephalic.  Atraumatic. Eyes: EOMI. No discharge. Cardiovascular: No JVD.  RRR. Respiratory: Normal effort.  No stridor.  Bilateral clear to auscultation.  + [place]. GI: Non-distended.  BS +.  + PEG. Skin: Warm and dry.  Intact. Psych: Normal mood.  Normal behavior. Musc: No edema in extremities.  No tenderness in extremities. Neuro: Alert Makes eye contact.   Exp>>rec word finding deficits,  apraxia, stable Motor: RUE/RLE: 0/5 proximal distal, unchanged Sensation diminished to light touch right side   Assessment/Plan: 1. Functional deficits which require 3+ hours per day of interdisciplinary therapy in a comprehensive inpatient rehab setting. Physiatrist is providing close team supervision and 24 hour management of active medical problems listed below. Physiatrist and rehab team continue to assess barriers to discharge/monitor patient progress toward functional and medical  goals  Care Tool:  Bathing    Body parts bathed by patient: Left arm, Abdomen, Chest, Face   Body parts bathed by helper: Right arm, Front perineal area, Buttocks, Right upper leg, Left upper leg, Right lower leg, Left lower leg     Bathing assist Assist Level: Total Assistance - Patient < 25%     Upper Body Dressing/Undressing Upper body dressing   What is the patient wearing?: Pull over shirt    Upper body assist Assist Level: Total Assistance - Patient < 25%    Lower Body Dressing/Undressing Lower body dressing      What is the patient wearing?: Pants     Lower body assist Assist for lower body dressing: Dependent - Patient 0%     Toileting Toileting    Toileting assist Assist for toileting: Total Assistance - Patient < 25%     Transfers Chair/bed transfer  Transfers assist     Chair/bed transfer assist level: Maximal Assistance - Patient 25 - 49%     Locomotion Ambulation   Ambulation assist      Assist level: 2 helpers Assistive device: Hand held assist (L hall rail) Max distance: 20'   Walk 10 feet activity   Assist     Assist level: 2 helpers Assistive device: Hand held assist (L hall rail)   Walk  50 feet activity   Assist Walk 50 feet with 2 turns activity did not occur: Safety/medical concerns         Walk 150 feet activity   Assist Walk 150 feet activity did not occur: Safety/medical concerns         Walk 10 feet on uneven surface  activity   Assist Walk 10 feet on uneven surfaces activity did not occur: Safety/medical concerns         Wheelchair     Assist Is the patient using a wheelchair?: Yes Type of Wheelchair: Manual    Wheelchair assist level: Dependent - Patient 0% Max wheelchair distance: 150'    Wheelchair 50 feet with 2 turns activity    Assist        Assist Level: Dependent - Patient 0%   Wheelchair 150 feet activity     Assist      Assist Level: Dependent - Patient 0%    Blood pressure (!) 163/93, pulse 76, temperature 97.9 F (36.6 C), temperature source Oral, resp. rate 16, height 5\' 3"  (1.6 m), weight 50 kg, SpO2 94 %. Medical Problem List and Plan: 1.  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  Antithrombotics: -DVT/anticoagulation: Eliquis Pharmaceutical: Other (comment)             -antiplatelet therapy: N/A 3. Pain Management: Tylenol as needed 4. Mood: Wellbutrin 75 mg twice daily             -antipsychotic agents: N/A 5. Neuropsych: This patient is not capable of making decisions on her own behalf. 6. Skin/Wound Care: Routine skin checks 7. Fluids/Electrolytes/Nutrition: encourage PO  -BUN sl elevated. Added FW thru tube  -protein for low albumin  Receiving TFs after meal if not eating 50% or more 8.  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 12/03/2020-   -MBS demonstrated improvement in oropharyngeal function with apraxia  -D1 nectar initiated 9/9, meds via PEG 9.  Atrial fibrillation.  Eliquis.  Amiodarone 200 mg daily, Lopressor 12.5 mg twice daily.  Cardiac rate controlled at present 10.  Hyperlipidemia.  Zetia 11.  COPD.Revefenscin nebulizer daily, Brovana as well as Pulmicort  Not requiring supplemental oxygen this AM  Weaned to room air as tolerated 12.  Acute on chronic diastolic congestive heart failure.     Filed Weights   12/29/20 0500 12/30/20 0500 12/31/20 0500  Weight: 50 kg 50 kg 50 kg    Stable 9/18 13. HTN:     ?  Trending up on 9/18, consider medication adjustments if persistent  14. Constipation  9/16- stools still mushy--hold miralax, continue senna-s at hs  LOS: 10 days A FACE TO FACE EVALUATION WAS PERFORMED  Beverly Li 10/16 12/31/2020, 9:49 AM

## 2020-12-31 NOTE — Progress Notes (Signed)
MD notified of BP 150/105; new orders noted. Continue to monitor

## 2020-12-31 NOTE — Plan of Care (Signed)
  Problem: RH BOWEL ELIMINATION Goal: RH STG MANAGE BOWEL WITH ASSISTANCE Description: STG Manage Bowel with min Assistance. Outcome: Not Progressing; incontinence   Problem: RH BLADDER ELIMINATION Goal: RH STG MANAGE BLADDER WITH ASSISTANCE Description: STG Manage Bladder With min Assistance Outcome: Not Progressing; incontinence   Problem: RH KNOWLEDGE DEFICIT GENERAL Goal: RH STG INCREASE KNOWLEDGE OF SELF CARE AFTER HOSPITALIZATION Description: Patient's family will be able to manage care and nutritional means using handouts and educational resources independently Outcome: Not Progressing; global aphasia   

## 2021-01-01 LAB — CBC WITH DIFFERENTIAL/PLATELET
Abs Immature Granulocytes: 0.03 10*3/uL (ref 0.00–0.07)
Basophils Absolute: 0 10*3/uL (ref 0.0–0.1)
Basophils Relative: 1 %
Eosinophils Absolute: 0.1 10*3/uL (ref 0.0–0.5)
Eosinophils Relative: 1 %
HCT: 46.5 % — ABNORMAL HIGH (ref 36.0–46.0)
Hemoglobin: 14.9 g/dL (ref 12.0–15.0)
Immature Granulocytes: 1 %
Lymphocytes Relative: 30 %
Lymphs Abs: 1.8 10*3/uL (ref 0.7–4.0)
MCH: 31.1 pg (ref 26.0–34.0)
MCHC: 32 g/dL (ref 30.0–36.0)
MCV: 97.1 fL (ref 80.0–100.0)
Monocytes Absolute: 0.5 10*3/uL (ref 0.1–1.0)
Monocytes Relative: 8 %
Neutro Abs: 3.7 10*3/uL (ref 1.7–7.7)
Neutrophils Relative %: 59 %
Platelets: UNDETERMINED 10*3/uL (ref 150–400)
RBC: 4.79 MIL/uL (ref 3.87–5.11)
RDW: 16.4 % — ABNORMAL HIGH (ref 11.5–15.5)
WBC: 6.1 10*3/uL (ref 4.0–10.5)
nRBC: 0 % (ref 0.0–0.2)

## 2021-01-01 LAB — BASIC METABOLIC PANEL
Anion gap: 11 (ref 5–15)
BUN: 21 mg/dL (ref 8–23)
CO2: 26 mmol/L (ref 22–32)
Calcium: 9.1 mg/dL (ref 8.9–10.3)
Chloride: 101 mmol/L (ref 98–111)
Creatinine, Ser: 0.62 mg/dL (ref 0.44–1.00)
GFR, Estimated: >60 mL/min (ref >60)
Glucose, Bld: 92 mg/dL (ref 70–99)
Potassium: 4.1 mmol/L (ref 3.5–5.1)
Sodium: 138 mmol/L (ref 135–145)

## 2021-01-01 NOTE — Progress Notes (Signed)
Orthopedic Tech Progress Note Patient Details:  ROY SNUFFER [birth year] [id number]  Ortho Devices Type of Ortho Device: Arm sling Ortho Device/Splint Location: Right arm Ortho Device/Splint Interventions: Ordered, Application, Adjustment   Post Interventions Patient Tolerated: Well Instructions Provided: Adjustment of device, Care of device  [place] A Gerilyn Pilgrim 01/01/2021, 1:59 PM

## 2021-01-01 NOTE — Progress Notes (Signed)
Husband accusing this nurse of pulling on patient's left shoulder during 2 person stedi transfer to bed today. Nurse tech aware that nurse was stabilizing patient from leaning into the lift with her face. After this nurse left husband told nurse tech they are "overly nitpicky." After nurse tech put clean sheets on the bed husband insisted on using patient's blanket, nurse tech set the blanket on the patient in bed. Then the husband re-did the blanket. Vic Ripper

## 2021-01-01 NOTE — Progress Notes (Signed)
Occupational Therapy Session Note  Patient Details  Name: Beverly Li MRN: [id number] Date of Birth: 03/21/1946  Today's Date: 01/01/2021 OT Individual Time: 0932-1030 OT Individual Time Calculation (min): 58 min    Short Term Goals: Week 2:  OT Short Term Goal 1 (Week 2): Patient will perofrm sit<>stand at the sink with mod A in preparation for BADL task OT Short Term Goal 2 (Week 2): Patient will perform self ROM of R UE with mod intructional cues OT Short Term Goal 3 (Week 2): Patient will tolerate standing for 2 minutes in preparation for BADL task  Skilled Therapeutic Interventions/Progress Updates:    Pt greeted semi-reclined in bed with spouse reporting pt got up to the commode today and had already changed clothes. Pt completed bed mobility with max to advance R LE, and mod A to elevate trunk. Addressed sitting balance at EOB while OT assisted with donning socks and shoes. Stand-pivot to wc with mod A to get to standing, then max A to pivot. Pt on room air and SpO2 at 98%. Pt brought to therapy gym and transferred to therapy may in similar fashion. Addressed sit<>stands, anterior weight shift, and facilitated weight bearing through R UE and R LE for neuro re-ed with standing clothes pin task. SpO2 at 96% with activity. Facilitated posterior pelvic tilt with sitting on small wedge and incorporated trunk elongation and shorting with reaching activities. OT using NDT technques at trunk to help facilitate normal movement patterns. Pt pivoted back to wc with max A and difficulty weight shifting to pivot LLE. Pt returned to room and left seated in wc with needs met.   OT placed SAEBO e-stim. SAEBO left on for 60 minutes. OT returned to remove SAEBO with skin intact and no adverse reactions.  [place] Stim One 330 pulse width 35 Hz pulse rate On 8 sec/ off 8 sec Ramp up/ down 2 sec Symmetrical Biphasic wave form  Max intensity 110mA at 500 Ohm load   Therapy Documentation Precautions:   Precautions Precautions: Fall, Other (comment) Precaution Comments: R hemiplegia with R shoulder subluxation, PEG, adominal binder; bladder/bowel incontinence Restrictions Weight Bearing Restrictions: No Pain:  No pain reported, repositioned for comfort   Therapy/Group: Individual Therapy  Elisabeth S Doe 01/01/2021, 10:23 AM 

## 2021-01-01 NOTE — Progress Notes (Signed)
Physical Therapy Session Note  Patient Details  Name: Beverly Li MRN: [id number] Date of Birth: [birth year]  Today's Date: 01/01/2021 PT Individual Time: 1134-1200 and 1302-1400 PT Individual Time Calculation (min): 26 min and 58 min  Short Term Goals: Week 2:  PT Short Term Goal 1 (Week 2): Pt will complete bed to chair transfers consistently with modA +1. PT Short Term Goal 2 (Week 2): Pt will ambulate 2' with modA +2.  Skilled Therapeutic Interventions/Progress Updates:     1st Session: Pt received seated in [facility] and agrees to therapy. No complaint of pain. WC transport to gym for time management. Multiple bouts of sit to stand during session with minA/modA and cues for anterior weight shift, with tactile cues at L distal quad for NM feedback. Pt ambulates with L upper extremity support on hall rail. PT provides modA, primarily for management of R lower extremity as well as facilitating lateral weight shifting and anterior weight translation for typical gait pattern. Pt ambulates x25' with +2 assist just for WC follow. Following seated rest break, PT wraps R foot to promote dorsiflexion and eversion and pt again ambulates x25' with improved gait speed and weight transitions. Pt left seated in WC with all needs within reach.  2nd Session: Pt received seated in [facility] and agrees to therapy. No report of pain. Pt's R leg ace wrapped to promote dorsiflexion and eversion. WC transport to gym for time management. Pt performs stand pivot transfer to mat table with modA and cues for body mechanics and sequencing. PT provides pt with large based quad cane and pt stands with modA and PT blocking L knee. Pt performs targeted stepping with L lower extremity and PT facilitating lateral weight shifting and blocking of R knee. PT notes activation of L quadriceps muscles during activity! Pt performs multiple bouts and generally requires modA to complete but at times as little as minA. Pt also performs block  training of sit to stand with emphasis on proper sequencing, anterior transition of center of gravity, and utilizing L lower extremity to assit. Pt completes x5 reps with modA. PT then provides pt with give-mohr sling to provide support for right upper extremity as well as NM feedback and joint approximation in standing. Pt ambulates x20' with large based quad cane [facility]) with modA +2. PT provides manual assistance for lateral weight shifting and blocking of R knee, as well as assisting with progression of R leg during swing phase. Verbal and visual cues provided for reciprocal/swing-through gait pattern with L leg to promote typical WB through R leg during gait cycle. Following extended rest break, pt ambulates forward x15' and backward x15' with modA +2 and similar cueing, but with increased emphasis on extension of R knee for WB support during stance phase. Pt asked if she would like to sit up or go back to bed following session, and with increased time provided, pt responds "I would like to sit in chair." Pt left sitting in [facility] with alarm intact and all needs within reach.  Therapy Documentation Precautions:  Precautions Precautions: Fall, Other (comment) Precaution Comments: R hemiplegia with R shoulder subluxation, PEG, adominal binder; bladder/bowel incontinence Restrictions Weight Bearing Restrictions: No   Therapy/Group: Individual Therapy  Beau Fanny, PT, DPT 01/01/2021, 2:16 PM

## 2021-01-01 NOTE — Progress Notes (Signed)
Orthopedic Tech Progress Note Patient Details:  Beverly Li [birth year] [id number] Called in order to [facility].  Patient ID: Beverly Li, female   DOB: 07-11-1945, 75 y.o.   MRN: [id number]  [name] 01/01/2021, 3:13 PM

## 2021-01-01 NOTE — Progress Notes (Signed)
PROGRESS NOTE   Subjective/Complaints: Labile bp's yesterday. Husband asked about sling for RUE. Slept ok  Review of Systems  Unable to perform ROS: Language , unchanged Objective:   No results found. Recent Labs    01/01/21 0713  WBC 6.1  HGB 14.9  HCT 46.5*  PLT PLATELET CLUMPS NOTED ON SMEAR, UNABLE TO ESTIMATE    Recent Labs    01/01/21 0713  NA 138  K 4.1  CL 101  CO2 26  GLUCOSE 92  BUN 21  CREATININE 0.62  CALCIUM 9.1     Intake/Output Summary (Last 24 hours) at 01/01/2021 1012 Last data filed at 01/01/2021 0756 Gross per 24 hour  Intake 238 ml  Output --  Net 238 ml          Physical Exam: Vital Signs Blood pressure (!) 155/99, pulse 65, temperature 97.9 F (36.6 C), resp. rate 17, height 5\' 3"  (1.6 m), weight 50 kg, SpO2 91 %. Constitutional: No distress . Vital signs reviewed. HEENT: NCAT, EOMI, oral membranes moist Neck: supple Cardiovascular: RRR without murmur. No JVD    Respiratory/Chest: CTA Bilaterally without wheezes or rales. Normal effort    GI/Abdomen: BS +, non-tender, non-distended, PEG clean Ext: no clubbing, cyanosis, or edema Psych: pleasant and cooperative  Skin: Warm and dry.  Intact. Musc: No edema in extremities.  No tenderness in extremities. Neuro: Alert Makes eye contact.   Exp>>rec word finding deficits,  apraxia, uttered words for me for the first time--they were somewhat in context of conversation Motor: RUE/RLE: 0/5 proximal distal, unchanged Sensation diminished to light touch right side   Assessment/Plan: 1. Functional deficits which require 3+ hours per day of interdisciplinary therapy in a comprehensive inpatient rehab setting. Physiatrist is providing close team supervision and 24 hour management of active medical problems listed below. Physiatrist and rehab team continue to assess barriers to discharge/monitor patient progress toward functional and  medical goals  Care Tool:  Bathing    Body parts bathed by patient: Left arm, Abdomen, Chest, Face   Body parts bathed by helper: Right arm, Front perineal area, Buttocks, Right upper leg, Left upper leg, Right lower leg, Left lower leg     Bathing assist Assist Level: Total Assistance - Patient < 25%     Upper Body Dressing/Undressing Upper body dressing   What is the patient wearing?: Pull over shirt    Upper body assist Assist Level: Total Assistance - Patient < 25%    Lower Body Dressing/Undressing Lower body dressing      What is the patient wearing?: Pants     Lower body assist Assist for lower body dressing: Dependent - Patient 0%     Toileting Toileting    Toileting assist Assist for toileting: Total Assistance - Patient < 25%     Transfers Chair/bed transfer  Transfers assist     Chair/bed transfer assist level: Maximal Assistance - Patient 25 - 49%     Locomotion Ambulation   Ambulation assist      Assist level: 2 helpers Assistive device: Hand held assist (L hall rail) Max distance: 20'   Walk 10 feet activity   Assist  Assist level: 2 helpers Assistive device: Hand held assist (L hall rail)   Walk 50 feet activity   Assist Walk 50 feet with 2 turns activity did not occur: Safety/medical concerns         Walk 150 feet activity   Assist Walk 150 feet activity did not occur: Safety/medical concerns         Walk 10 feet on uneven surface  activity   Assist Walk 10 feet on uneven surfaces activity did not occur: Safety/medical concerns         Wheelchair     Assist Is the patient using a wheelchair?: Yes Type of Wheelchair: Manual    Wheelchair assist level: Dependent - Patient 0% Max wheelchair distance: 150'    Wheelchair 50 feet with 2 turns activity    Assist        Assist Level: Dependent - Patient 0%   Wheelchair 150 feet activity     Assist      Assist Level: Dependent -  Patient 0%   Blood pressure (!) 155/99, pulse 65, temperature 97.9 F (36.6 C), resp. rate 17, height 5\' 3"  (1.6 m), weight 50 kg, SpO2 91 %. Medical Problem List and Plan: 1.  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  Antithrombotics: -DVT/anticoagulation: Eliquis [employer]: Other (comment)             -antiplatelet therapy: N/A 3. Pain Management: Tylenol as needed 4. Mood: Wellbutrin 75 mg twice daily             -antipsychotic agents: N/A 5. Neuropsych: This patient is not capable of making decisions on her own behalf. 6. Skin/Wound Care: Routine skin checks 7. Fluids/Electrolytes/Nutrition: encourage PO  -BUN improved. continue FW thru tube  -protein for low albumin  Receiving TFs after meal if not eating 50% or more 8.  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 12/03/2020-   -MBS demonstrated improvement in oropharyngeal function with apraxia  -D1 nectar initiated 9/9, meds via PEG 9.  Atrial fibrillation.  Eliquis.  Amiodarone 200 mg daily, Lopressor 12.5 mg twice daily.  Cardiac rate controlled at present 10.  Hyperlipidemia.  Zetia 11.  COPD.Revefenscin nebulizer daily, Brovana as well as Pulmicort  Not requiring supplemental oxygen this AM  Weaned to room air as tolerated 12.  Acute on chronic diastolic congestive heart failure.     Filed Weights   12/29/20 0500 12/30/20 0500 12/31/20 0500  Weight: 50 kg 50 kg 50 kg    Stable 9/19 13. HTN:     ?  Trending up on 9/18, consider medication adjustments if persistent  14. Constipation  9/19- stools liquidy--holding miralax, also hold senna-s at hs  LOS: 11 days A FACE TO FACE EVALUATION WAS PERFORMED  10/19 01/01/2021, 10:12 AM

## 2021-01-01 NOTE — Progress Notes (Signed)
Speech Language Pathology Daily Session Note  Patient Details  Name: Beverly Li MRN: [id number] Date of Birth: January 07, 1946  Today's Date: 01/01/2021 SLP Individual Time: 5053-9767 SLP Individual Time Calculation (min): 68 min  Short Term Goals: Week 2: SLP Short Term Goal 1 (Week 2): Patient will consume thin liquids with minimal overt s/s of aspiration with Min verbal cues for use of small sips via cup. SLP Short Term Goal 2 (Week 2): Patient will demonstrate efficient mastication and complete oral clearance with Min verbal cues with trials of Dys. 2 textures without overt s/s of aspiration over 2 sessions prior to upgrade. SLP Short Term Goal 3 (Week 2): Patient will read aloud at the word level with 75% accuracy and Mod multimodal cues. SLP Short Term Goal 4 (Week 2): Patient will utilize her communication board to express wants/needs in 50% of opportunities with Mod multimodal cues. SLP Short Term Goal 5 (Week 2): Patient will write at the word level with 25% accuracy and Max A multimodal cues. SLP Short Term Goal 6 (Week 2): Patient will demonstrate reading comprehension at the phrase level with 50% accuracy and Mod verbal cues.  Skilled Therapeutic Interventions:Skilled ST services focused on swallow and language skills. Pt's "bestie" friend was present and informed SLP pt had to use the bathroom urgently. She noted pt expressed "go now" and eventually she was able to distinguish the exact need. SLP and nurse assisted pt in transfer via stedy, pt required mod A increasing to max A due to fatigue. Pt had completed BM in brief, SLP and nursed assisted in changing pants and brief on the toilet. Pt appeared very fatigued throughout session and demonstrating labored deep breaths during attempts to communicate. Pt was resistant to redirection of yes/no questions via gestures or communication board, instead attempting to verbalize thoughts with little success at the word level. Pt was able to  match black/white picture to object in a field of two, but required hand over hand assistance to match word to objects in a field of 2 even in return demonstration tasks. Pt was unable to write name, but was able to trace at letter level name with 50% accuracy. Pt completed oral care with set up and mod A verbal cues for thoroughness. Pt demonstrated s/s overt aspiration when attempting to swish thin liquid a second time. Pt consumed thin liquid via cup using small sips with mildly swallow initiation delayed noted but no overt s/s aspiration. Pt was able to requested "bed" when presented with communication board and given mod A multimodal cues to clarify response. Pt was handed off to nurse and NT for transfer to bed. SLP recommends to continue skilled services.      Pain Pain Assessment Pain Score: 0-No pain  Therapy/Group: Individual Therapy  Jevaughn Degollado  [facility] 01/01/2021, 3:44 PM

## 2021-01-02 MED ORDER — METOPROLOL TARTRATE 25 MG PO TABS
25.0000 mg | ORAL_TABLET | Freq: Two times a day (BID) | ORAL | Status: DC
  Administered 2021-01-02 – 2021-01-05 (×6): 25 mg
  Filled 2021-01-02 (×6): qty 1

## 2021-01-02 MED ORDER — METOPROLOL TARTRATE 12.5 MG HALF TABLET
12.5000 mg | ORAL_TABLET | Freq: Once | ORAL | Status: AC
  Administered 2021-01-02: 12.5 mg
  Filled 2021-01-02: qty 1

## 2021-01-02 NOTE — Progress Notes (Signed)
Physical Therapy Session Note  Patient Details  Name: Beverly Li MRN: [id number] Date of Birth: 1945/11/26  Today's Date: 01/02/2021 PT Individual Time: 1105-1200 and 2706-2376 PT Individual Time Calculation (min): 55 min and 46 min  Short Term Goals: Week 2:  PT Short Term Goal 1 (Week 2): Pt will complete bed to chair transfers consistently with modA +1. PT Short Term Goal 2 (Week 2): Pt will ambulate 15' with modA +2.  Skilled Therapeutic Interventions/Progress Updates:     1st session: Pt received seated in [facility] and agrees to therapy. NO complaint of pain. WC transport to gym for time management. Pt performs stand step transfer to mat table with modA +1 and cues for foot placement, sequencing, and posture. PT provides pt with giv-mohr sling for R upper extremity to provide support as well as NM feedback via joint approximation. Pt performs multiple reps of sit to stand during session with modA +2 HHA, with cues for anterior weight shifting and body mechanics. Mirror provided for visual feedback. Pt remains standing with modA +2 primarily due to posterior bias. Pt noted to activate R lower extremity extensor groups in standing. Progression of activity includes pt propping L foot on 6 inch step to inhibit use. Pt requires increased assistance to stand but able to maintain standing with minA to modA for R knee blocking. Following seated rest break, pt performs forward and backward ambulation with modA +2 HHA, 5' forward and 5' backward. Pt demos slight buckling but with much improved R knee stability relative to previous sessions. Following seated rest break, pt ambulates additional 20' with same assist and improved reciprocal gait pattern. Pt left seated in WC with alarm intact and all needs wihtin reach.  2nd Session: Pt received seated in [facility] and agrees to therapy. No complaint of pain. WC transport to gym for time management. Pt performs multiple bouts of ambulation during session. Initially  PT wraps R lower extremity with ace wrap to promote dorsiflexion and eversion. Pt then ambulates 2x50' and 1x30'. Pt requires modA +2 HHA on L side and PT providing min blocking of R knee during stance phase. When pt effectively transitions weight to L foot during stance phase, pt is able to progress R lower extremity during swing phase without assistance, but typically requires minA to modA to manage progression for R lower extremity. PT provides multimodal cues and demonstration for reciprocal gait pattern, upright gaze to improve posture and balance, lateral weight shifting, and sequencing and positioning during turns. PT has discussion with pt regarding pt's fatigue and pt indicates that she has had too much  "company", negatively affecting her energy levels. PT communicates this desire to husband following session.   Pt performs stand step transfer back to bed with modA. Sit to supine with modA. Left with alarm intact and all needs within reach.  Therapy Documentation Precautions:  Precautions Precautions: Fall, Other (comment) Precaution Comments: R hemiplegia with R shoulder subluxation, PEG, adominal binder; bladder/bowel incontinence Restrictions Weight Bearing Restrictions: No    Therapy/Group: Individual Therapy  Beau Fanny, PT, DPT 01/02/2021, 3:53 PM

## 2021-01-02 NOTE — Progress Notes (Signed)
Speech Language Pathology Daily Session Note  Patient Details  Name: Beverly Li MRN: [id number] Date of Birth: 01-29-1946  Today's Date: 01/02/2021 SLP Individual Time: 0730-0800 SLP Individual Time Calculation (min): 30 min  Short Term Goals: Week 2: SLP Short Term Goal 1 (Week 2): Patient will consume thin liquids with minimal overt s/s of aspiration with Min verbal cues for use of small sips via cup. SLP Short Term Goal 2 (Week 2): Patient will demonstrate efficient mastication and complete oral clearance with Min verbal cues with trials of Dys. 2 textures without overt s/s of aspiration over 2 sessions prior to upgrade. SLP Short Term Goal 3 (Week 2): Patient will read aloud at the word level with 75% accuracy and Mod multimodal cues. SLP Short Term Goal 4 (Week 2): Patient will utilize her communication board to express wants/needs in 50% of opportunities with Mod multimodal cues. SLP Short Term Goal 5 (Week 2): Patient will write at the word level with 25% accuracy and Max A multimodal cues. SLP Short Term Goal 6 (Week 2): Patient will demonstrate reading comprehension at the phrase level with 50% accuracy and Mod verbal cues.  Skilled Therapeutic Interventions: Skilled treatment session focused on communication and dysphagia goals. SLP initially utilized the communication board for patient to answer yes/no questions regarding tray set-up. Patient then independently initiated verbal expression at the phrase level to express wants/needs like "I'm through" and "I would like for you to make me___." Patient consumed breakfast tray of Dys. 1 textures with nectar-thick liquids without overt s/s of aspiration but required Min verbal cues for small bites and to self-monitor and correct right anterior spillage. Recommend trials of Dys. 2 textures during upcoming sessions. Patient left upright in bed with alarm on and all needs within reach. Continue with current plan of care.      Pain Pain  Assessment Pain Scale: 0-10 Pain Score: 0-No pain  Therapy/Group: Individual Therapy  Tevion Laforge 01/02/2021, 11:27 AM

## 2021-01-02 NOTE — Progress Notes (Signed)
PROGRESS NOTE   Subjective/Complaints:  Pt sitting up eating breakfast. Seems to be comfortable.  Review of Systems  Unable to perform ROS: Language , no changes  Objective:   No results found. Recent Labs    01/01/21 0713  WBC 6.1  HGB 14.9  HCT 46.5*  PLT PLATELET CLUMPS NOTED ON SMEAR, UNABLE TO ESTIMATE     Recent Labs    01/01/21 0713  NA 138  K 4.1  CL 101  CO2 26  GLUCOSE 92  BUN 21  CREATININE 0.62  CALCIUM 9.1     Intake/Output Summary (Last 24 hours) at 01/02/2021 1610 Last data filed at 01/01/2021 1336 Gross per 24 hour  Intake 100 ml  Output --  Net 100 ml          Physical Exam: Vital Signs Blood pressure (!) 175/95, pulse 91, temperature 98 F (36.7 C), resp. rate 14, height 5\' 3"  (1.6 m), weight 47.2 kg, SpO2 94 %. General: No acute distress HEENT: NCAT, EOMI, oral membranes moist Cards: reg rate  Chest: normal effort Abdomen: Soft, NT, ND Skin: dry, intact Extremities: no edema Psych: pleasant and appropriate  Skin: Warm and dry.  Intact. Musc: No edema in extremities.  No tenderness in extremities. Mild sublux right shoulder Neuro: Alert Makes eye contact.   Exp>>rec word finding deficits,  apraxia, beginning to express verbally in context Motor: RUE/RLE: 0/5 proximal distal, unchanged Sensation diminished to light touch right side   Assessment/Plan: 1. Functional deficits which require 3+ hours per day of interdisciplinary therapy in a comprehensive inpatient rehab setting. Physiatrist is providing close team supervision and 24 hour management of active medical problems listed below. Physiatrist and rehab team continue to assess barriers to discharge/monitor patient progress toward functional and medical goals  Care Tool:  Bathing    Body parts bathed by patient: Left arm, Abdomen, Chest, Face   Body parts bathed by helper: Right arm, Front perineal area, Buttocks,  Right upper leg, Left upper leg, Right lower leg, Left lower leg     Bathing assist Assist Level: Total Assistance - Patient < 25%     Upper Body Dressing/Undressing Upper body dressing   What is the patient wearing?: Pull over shirt    Upper body assist Assist Level: Total Assistance - Patient < 25%    Lower Body Dressing/Undressing Lower body dressing      What is the patient wearing?: Pants     Lower body assist Assist for lower body dressing: Dependent - Patient 0%     Toileting Toileting    Toileting assist Assist for toileting: Total Assistance - Patient < 25%     Transfers Chair/bed transfer  Transfers assist     Chair/bed transfer assist level: Maximal Assistance - Patient 25 - 49%     Locomotion Ambulation   Ambulation assist      Assist level: 2 helpers Assistive device: Hand held assist (L hall rail) Max distance: 20'   Walk 10 feet activity   Assist     Assist level: 2 helpers Assistive device: Hand held assist (L hall rail)   Walk 50 feet activity   Assist Walk 50 feet  with 2 turns activity did not occur: Safety/medical concerns         Walk 150 feet activity   Assist Walk 150 feet activity did not occur: Safety/medical concerns         Walk 10 feet on uneven surface  activity   Assist Walk 10 feet on uneven surfaces activity did not occur: Safety/medical concerns         Wheelchair     Assist Is the patient using a wheelchair?: Yes Type of Wheelchair: Manual    Wheelchair assist level: Dependent - Patient 0% Max wheelchair distance: 150'    Wheelchair 50 feet with 2 turns activity    Assist        Assist Level: Dependent - Patient 0%   Wheelchair 150 feet activity     Assist      Assist Level: Dependent - Patient 0%   Blood pressure (!) 175/95, pulse 91, temperature 98 F (36.7 C), resp. rate 14, height 5\' 3"  (1.6 m), weight 47.2 kg, SpO2 94 %. Medical Problem List and Plan: 1.   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  Antithrombotics: -DVT/anticoagulation: Eliquis [employer]: Other (comment)             -antiplatelet therapy: N/A 3. Pain Management: Tylenol as needed 4. Mood: Wellbutrin 75 mg twice daily             -antipsychotic agents: N/A 5. Neuropsych: This patient is not capable of making decisions on her own behalf. 6. Skin/Wound Care: Routine skin checks 7. Fluids/Electrolytes/Nutrition: encourage PO  -BUN improved. continue FW thru tube  -protein for low albumin  9/20 discussed with pt and family. They would like to hold TF to see if it helps spur appetite. Continue H20 flushes  8.  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 12/03/2020-   -MBS demonstrated improvement in oropharyngeal function with apraxia  -D1 nectar initiated 9/9, meds via PEG, adv per SLP 9.  Atrial fibrillation.  Eliquis.  Amiodarone 200 mg daily, Lopressor 12.5 mg twice daily.  Cardiac rate controlled at present 10.  Hyperlipidemia.  Zetia 11.  COPD.Revefenscin nebulizer daily, Brovana as well as Pulmicort  Not requiring supplemental oxygen currently    12.  Acute on chronic diastolic congestive heart failure.     Filed Weights   12/30/20 0500 12/31/20 0500 01/02/21 0548  Weight: 50 kg 50 kg 47.2 kg    Stable 9/19 13. HTN:     BP trending up. Increase metoprolol to 25mg  bid  14. Constipation  9/19- stools soft--holding miralax, also holding senna-s at hs  LOS: 12 days A FACE TO FACE EVALUATION WAS PERFORMED  01/02/2021, 9:07 AM

## 2021-01-02 NOTE — Patient Care Conference (Signed)
Inpatient RehabilitationTeam Conference and Plan of Care Update Date: 01/02/2021   Time: 10:33 AM    Patient Name: Beverly Li      Medical Record Number: [id number]  Date of Birth: 1945-12-15 Sex: Female         Room/Bed: 4M08C/4M08C-01 Payor Info: Payor: [employer]vertising copywriter MEDICARE / Plan: [facility] MEDICARE / Product Type: *No Product type* /    Admit Date/Time:  12/21/2020  3:41 PM  Primary Diagnosis:  Left middle cerebral artery stroke [facility])  Hospital Problems: Principal Problem:   Left middle cerebral artery stroke [facility]) Active Problems:   Acute on chronic diastolic (congestive) heart failure (HCC)   Dysphagia, post-stroke    Expected Discharge Date: Expected Discharge Date: 01/19/21  Team Members Present: Physician leading conference: Dr. Faith Rogue Social Worker Present: Cecile Sheerer, LCSWA Nurse Present: Kennyth Arnold, RN PT Present: Malachi Pro, PT OT Present: Kearney Hard, OT SLP Present: Feliberto Gottron, SLP PPS Coordinator present : Fae Pippin, SLP     Current Status/Progress Goal Weekly Team Focus  Bowel/Bladder   incontinent b/b, lbm 9/19  Decreased incontinent episodes  timed toileting q 2hr   Swallow/Nutrition/ Hydration   Dys 1 textures and nectar thick liquids, Min A  Supervision  tolerance of diet, dys 2 textures and thin liquid trials with ST only   ADL's   Max A of 1  Min/CGA  NMES, transfers sit<>stands, self-care retraining, R UE NMR, pt/family education, dc planning   Mobility   mod to maxA bed mobility, modA transfers, modA +2 25' with large based quad cane  minA  R hemibody NMR, transfers, balance, ambulation   Communication   Mod A communication and Min A comprehension of basic information  Supervision-Min A  naming, use of communication board, reading word-phrase level and writting   Safety/Cognition/ Behavioral Observations            Pain   some pain reported, Tylenol available  <3  assess q 4 hr and prn   Skin   peg tube  site, clean  Mantain site free from infection. Site to remain clean, dry, intact  assess skin q shift and prn     Discharge Planning:  Anticipate for pt to d/c to home with her husband and hired caregivers.   Team Discussion: Poor appetite, supplementing with tube feeds. HR controlled. Incontinent B/B, LBM 9/19. Tylenol for pain, managing peg tube site. Nursing educating on peg site care and management, pain and medication management. Barriers are BP elevated, appetite, incontinence, and mobility. Not sure of husband and providing self care. Max assist overall. STS improving. Walked with quad cane, mod assist +2 about 25 ft. Using a communication board, verbalizing more, and educating husband not to answer for her. Mobility and self-care are current barriers.  Patient on target to meet rehab goals: yes  *See Care Plan and progress notes for long and short-term goals.   Revisions to Treatment Plan:  MD managing daily nutritional adjustments.  Teaching Needs: Family education, medication management, pain management, nutritional management, skin/wound care, peg tube management, bowel/bladder management, transfer training, gait training, balance training, endurance training, safety awareness.  Current Barriers to Discharge: Decreased caregiver support, Medical stability, Home enviroment access/layout, Incontinence, Neurogenic bowel and bladder, Wound care, Lack of/limited family support, Medication compliance, Behavior, and Nutritional means  Possible Resolutions to Barriers: Continue current medications for pain management, educate husband on bowel/bladder management, nutritional support, continue to work on mobility.     Medical Summary Current Status: some improvements  in verbal apraxia. eating somewhat with PEG supplements. BP fair, recent adjustments made  Barriers to Discharge: Medical stability   Possible Resolutions to Barriers/Weekly Focus: daily nutritional adjustments, assessment of  labs and pt data   Continued Need for Acute Rehabilitation Level of Care: The patient requires daily medical management by a physician with specialized training in physical medicine and rehabilitation for the following reasons: Direction of a multidisciplinary physical rehabilitation program to maximize functional independence : Yes Medical management of patient stability for increased activity during participation in an intensive rehabilitation regime.: Yes Analysis of laboratory values and/or radiology reports with any subsequent need for medication adjustment and/or medical intervention. : Yes   I attest that I was present, lead the team conference, and concur with the assessment and plan of the team.   Tennis Must 01/02/2021, 2:12 PM

## 2021-01-02 NOTE — Progress Notes (Signed)
Occupational Therapy Session Note  Patient Details  Name: Beverly Li MRN: [id number] Date of Birth: 1946/01/29  Today's Date: 01/02/2021 OT Individual Time: 5697-9480 OT Individual Time Calculation (min): 56 min   Short Term Goals: Week 2:  OT Short Term Goal 1 (Week 2): Patient will perofrm sit<>stand at the sink with mod A in preparation for BADL task OT Short Term Goal 2 (Week 2): Patient will perform self ROM of R UE with mod intructional cues OT Short Term Goal 3 (Week 2): Patient will tolerate standing for 2 minutes in preparation for BADL task  Skilled Therapeutic Interventions/Progress Updates:    Pt greeted semi-reclined in bed and agreeable to OT Treatment session. Pt stated "I'm fine" when OT asked how she was. Stedy used for transfers to assist with maintaining upright position and standing balance while OT assisted with clothing management and peri-care after toilet transfer. Pt was incontinent of bladder and unable to void further in commode. Stedy transfer to shower bench with cutout. Improved initiation to help with bathing tasks. Hand over hand A to integrate L UE into bathing tasks. OT assisted with bringing R LE into figure 4 position, then pt able to help thread pant legs. Sit<>stands with R knee block and mod A. Addressed standing balance/endurance and R UE weight bearing on sink counter while OT assisted with pulling up pants. Improved attention to R UE when threading shirt sleeve. Pt left seated in wc with R UE support on lap tray, husband present, and needs met.  OT placed SAEBO e-stim for shoulder subluxation. SAEBO left on for 60 minutes. OT returned to remove SAEBO with skin intact and no adverse reactions.  Sa[place]o Stim One 330 pulse width 35 Hz pulse rate On 8 sec/ off 8 sec Ramp up/ down 2 sec Symmetrical Biphasic wave form  Max intensity 161m at 500 Ohm load   Therapy Documentation Precautions:  Precautions Precautions: Fall, Other (comment) Precaution  Comments: R hemiplegia with R shoulder subluxation, PEG, adominal binder; bladder/bowel incontinence Restrictions Weight Bearing Restrictions: No Pain: Pain Assessment Pain Scale: 0-10 Pain Score: 0-No pain ADL: ADL Eating: Maximal assistance Grooming: Maximal assistance Upper Body Bathing: Maximal assistance Lower Body Bathing: Dependent Upper Body Dressing: Dependent Lower Body Dressing: Dependent Toileting: Dependent Toilet Transfer: Maximal assistance Tub/Shower Transfer: Maximal assistance   Therapy/Group: Individual Therapy  EValma Cava9/20/2022, 10:58 AM

## 2021-01-02 NOTE — Progress Notes (Signed)
Patient ID: Beverly Li, female   DOB: 08/02/1945, 75 y.o.   MRN: [id number]  SW met with pt, pt husband and friend in room to provide updates from team conference, and no change in pt d/c date. SW asked if he has selected a HHA. Reports not at this time. He also shared he is exploring several options such as [facility] and looking into private aide care. SW stressed importance of family education going forward to ensure he knows how to provide all of her care needs for when she should be going home. He will f/u with SW about when/if his sons will be coming in for family edu as well since he is the primary caregiver.   Loralee Pacas, MSW, [place] Office: [phone] Cell: [phone] Fax: [phone]

## 2021-01-03 MED ORDER — ENSURE ENLIVE PO LIQD
237.0000 mL | Freq: Three times a day (TID) | ORAL | Status: DC
  Administered 2021-01-03 – 2021-01-08 (×5): 237 mL via ORAL

## 2021-01-03 MED ORDER — PROSOURCE PLUS PO LIQD
30.0000 mL | Freq: Two times a day (BID) | ORAL | Status: DC
  Administered 2021-01-04 – 2021-01-09 (×12): 30 mL via ORAL
  Filled 2021-01-03 (×12): qty 30

## 2021-01-03 NOTE — Progress Notes (Signed)
Nutrition Follow-up  DOCUMENTATION CODES:   Severe malnutrition in context of chronic illness  INTERVENTION:  Provide Ensure Enlive po TID (thickened to appropriate consistency, each supplement provides 350 kcal and 20 grams of protein.  Provide 30 ml Prosource plus po BID, each supplement provides 100 kcal and 15 grams of protein.   Encourage PO intake.   NUTRITION DIAGNOSIS:   Severe Malnutrition related to chronic illness (CHF) as evidenced by severe muscle depletion, severe fat depletion; ongoing  GOAL:   Patient will meet greater than or equal to 90% of their needs; progressing  MONITOR:   PO intake, Supplement acceptance, Diet advancement, Skin, TF tolerance, Weight trends, Labs, I & O's  REASON FOR ASSESSMENT:   Consult Enteral/tube feeding initiation and management  ASSESSMENT:   75-year  female with history of chronic diastolic congestive heart failure, atrial fibrillation maintained on Eliquis, Bell's palsy, hypertension, OSA, prediabetes, 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 Bolus tube feeding orders discontinued/put in hold yesterday per family request to help encourage and stimulate appetite. Pt continues on a dysphagia 1 diet with nectar thick liquids. Pt currently has Ensure ordered with varied consumption. RD to additionally order Prosource plus and increase Ensure orders to aid in caloric and protein needs. Family to continue to encouraged pt po intake. Free water flushes to continue via PEG.  Labs and medications reviewed.   Diet Order:   Diet Order             DIET - DYS 1 Room service appropriate? Yes; Fluid consistency: Nectar Thick  Diet effective now                   EDUCATION NEEDS:    Not appropriate for education at this time  Skin:  Skin Assessment: Reviewed RN Assessment  Last BM:  9/19  Height:   Ht Readings from Last 1 Encounters:  12/21/20 5\' 3"  (1.6 m)    Weight:   Wt Readings from Last 1 Encounters:  01/02/21 47.2 kg   BMI:  Body mass index is 18.43 kg/m.  Estimated Nutritional Needs:   Kcal:  1600-1800  Protein:  80-90 grams  Fluid:  >/= 1.6 L/day  01/04/21, MS, RD, LDN RD pager number/after hours weekend pager number on Amion.

## 2021-01-03 NOTE — Progress Notes (Signed)
Occupational Therapy Session Note  Patient Details  Name: Beverly Li MRN: [id number] Date of Birth: 1945/04/25  Today's Date: 01/03/2021 OT Individual Time: 1050-1159 OT Individual Time Calculation (min): 69 min    Short Term Goals: Week 1:  OT Short Term Goal 1 (Week 1): Patient will maintain sitting balance with mod A during functional BADL task OT Short Term Goal 1 - Progress (Week 1): Met OT Short Term Goal 2 (Week 1): Patient will perofrm sit<>stand at the sink with mod A in preparation for BADL task OT Short Term Goal 2 - Progress (Week 1): Progressing toward goal OT Short Term Goal 3 (Week 1): Patient will complete 1 step of UB dressing task with mod questioning cues. OT Short Term Goal 3 - Progress (Week 1): Met Week 2:  OT Short Term Goal 1 (Week 2): Patient will perofrm sit<>stand at the sink with mod A in preparation for BADL task OT Short Term Goal 2 (Week 2): Patient will perform self ROM of R UE with mod intructional cues OT Short Term Goal 3 (Week 2): Patient will tolerate standing for 2 minutes in preparation for BADL task   Skilled Therapeutic Interventions/Progress Updates:  Patient met seated in wc in agreement with OT treatment session. Spouse present at bedside. Patient declined pain. Session with focus on sit to stand transfers, RUE PROM, NMR and therapeutic activity. Total A for wc transport to and from ortho gym. Patient performed 2 bouts, 2 min each on BUE arm ergometer with ace bandage to maintain position of RUE on pedal. External assist required for forward propulsion but patient able to produce enough power with LUE for backward propulsion without external assist. Therapist provided manual scapular mobility during task. BITs system used with focus on speech and cognition. Patient able to repeat words after OT stated them with 50% accuracy. 50% accuracy for correctly sequencing words as well. Self-ROM with use of RUE and Max A for 1 set x10 reps each. Patient  demonstrates ability to scoot anteriorly in wc with heavy Mod A in prep for sit to stand transfers to RW. R hand splint applied to RW in prep for further progression of sit to stand transfers and subsequent mobility. Patient stood x4 reps with Mod A +2 at most and Mod A at best with cues for hand placement and R knee block. Patient stood for up to 45sec each time. Able to rock gently from side to side to promote RLE weight bearing. Patient also able to gently flex L knee and straighten several times with Mod A to maintain standing balance. Rehab tech provided Total A for wc transport back to hospital room.   Therapy Documentation Precautions:  Precautions Precautions: Fall, Other (comment) Precaution Comments: R hemiplegia with R shoulder subluxation, PEG, adominal binder; bladder/bowel incontinence Restrictions Weight Bearing Restrictions: No General:    Therapy/Group: Individual Therapy  Eila Runyan R [place]-Davis 01/03/2021, 7:22 AM

## 2021-01-03 NOTE — Progress Notes (Signed)
Physical Therapy Session Note  Patient Details  Name: Beverly Li MRN: [id number] Date of Birth: 07-Jan-1946  Today's Date: 01/03/2021 PT Individual Time: 0905-0959 PT Individual Time Calculation (min): 54 min   Short Term Goals: Week 2:  PT Short Term Goal 1 (Week 2): Pt will complete bed to chair transfers consistently with modA +1. PT Short Term Goal 2 (Week 2): Pt will ambulate 55' with modA +2.  Skilled Therapeutic Interventions/Progress Updates:     Pt received supine in bed and agrees to therapy. No complaint of pain. PT assists pt with threading pants over both legs and bringing to knees. Pt performs bridge with cues for body mechanics and PT pulls pants up to hips. Supine to sit with maxA. PT provides totalA to don shoes at EOB. Stand step transfer to [name] with modA. WC transport to gym for time management. Pt's R leg ace wrapped for dorsiflexion and eversion assistance and pt dons giv-mohr sling. Pt ambulates x50' with modA +2 with HHA on L and PT providing assistance at R lower extremity primarily for knee stability in stance phase, through pt does not have any overt buckling during session. PT also facilitates lateral weight shifting. When weight is shifted adequately to the L during stance phase, pt able to progress R foot without manual assistance, and does so >75% of time. Following seated rest break, pt ambulates additional 50', with modA +2 but without hand support on L side to encourage full weight shift to the R during stance phase. Pt gait pattern suffers as L stride length becomes much shorter, and pt requires cueing for reciprocal gait pattern and increasing stance time on R. WC transport back to room. Pt left seated in WC with all needs within reach.  Therapy Documentation Precautions:  Precautions Precautions: Fall, Other (comment) Precaution Comments: R hemiplegia with R shoulder subluxation, PEG, adominal binder; bladder/bowel incontinence Restrictions Weight Bearing  Restrictions: No   Therapy/Group: Individual Therapy  Beau Fanny, PT, DPT 01/03/2021, 4:21 PM

## 2021-01-03 NOTE — Progress Notes (Signed)
Speech Language Pathology Daily Session Note  Patient Details  Name: Beverly Li MRN: [id number] Date of Birth: 1945-07-17  Today's Date: 01/03/2021 SLP Individual Time: 1345-1430 SLP Individual Time Calculation (min): 45 min  Short Term Goals: Week 2: SLP Short Term Goal 1 (Week 2): Patient will consume thin liquids with minimal overt s/s of aspiration with Min verbal cues for use of small sips via cup. SLP Short Term Goal 2 (Week 2): Patient will demonstrate efficient mastication and complete oral clearance with Min verbal cues with trials of Dys. 2 textures without overt s/s of aspiration over 2 sessions prior to upgrade. SLP Short Term Goal 3 (Week 2): Patient will read aloud at the word level with 75% accuracy and Mod multimodal cues. SLP Short Term Goal 4 (Week 2): Patient will utilize her communication board to express wants/needs in 50% of opportunities with Mod multimodal cues. SLP Short Term Goal 5 (Week 2): Patient will write at the word level with 25% accuracy and Max A multimodal cues. SLP Short Term Goal 6 (Week 2): Patient will demonstrate reading comprehension at the phrase level with 50% accuracy and Mod verbal cues.  Skilled Therapeutic Interventions: Skilled treatment session focused on dysphagia and cognitive goals. SLP facilitated session by providing skilled observation with trials of Dys. 2 textures. Patient demonstrated efficient mastication and complete oral clearance without overt s/s of aspiration. Recommend trial tray prior to upgrade. Patient independently reported, "I lost my numbers" and requested to work on counting today. Initially, patient required Max A multimodal cues to count from 1-10 and was unable to verbalize the numbers 6 or 7. But by end of session, patient was able to count to 10 with only visual cues and 100% accuracy. Patient continues to demonstrate improved initiation of verbal expression at the word and phrase level to express wants/needs. Patient  left upright in bed with alarm on and all needs within reach. Continue with current plan of care.       Pain No/Denies Pain   Therapy/Group: Individual Therapy  Delaila Nand 01/03/2021, 3:16 PM

## 2021-01-04 DIAGNOSIS — K219 Gastro-esophageal reflux disease without esophagitis: Secondary | ICD-10-CM | POA: Diagnosis not present

## 2021-01-04 DIAGNOSIS — E7800 Pure hypercholesterolemia, unspecified: Secondary | ICD-10-CM | POA: Diagnosis not present

## 2021-01-04 DIAGNOSIS — I480 Paroxysmal atrial fibrillation: Secondary | ICD-10-CM | POA: Diagnosis not present

## 2021-01-04 DIAGNOSIS — I10 Essential (primary) hypertension: Secondary | ICD-10-CM | POA: Diagnosis not present

## 2021-01-04 DIAGNOSIS — J449 Chronic obstructive pulmonary disease, unspecified: Secondary | ICD-10-CM | POA: Diagnosis not present

## 2021-01-04 DIAGNOSIS — M810 Age-related osteoporosis without current pathological fracture: Secondary | ICD-10-CM | POA: Diagnosis not present

## 2021-01-04 DIAGNOSIS — G4700 Insomnia, unspecified: Secondary | ICD-10-CM | POA: Diagnosis not present

## 2021-01-04 DIAGNOSIS — E785 Hyperlipidemia, unspecified: Secondary | ICD-10-CM | POA: Diagnosis not present

## 2021-01-04 NOTE — Progress Notes (Signed)
PROGRESS NOTE   Subjective/Complaints:  Pt up in bed. No new issues this morning. Husband asked if it was ok for her to sleep on right side.  Indicates that appetite hasn't increased a whole lot yet  Review of Systems  Unable to perform ROS: Language , no changes  Objective:   No results found. No results for input(s): WBC, HGB, HCT, PLT in the last 72 hours.   No results for input(s): NA, K, CL, CO2, GLUCOSE, BUN, CREATININE, CALCIUM in the last 72 hours.   Intake/Output Summary (Last 24 hours) at 01/04/2021 1011 Last data filed at 01/04/2021 0900 Gross per 24 hour  Intake 600 ml  Output --  Net 600 ml          Physical Exam: Vital Signs Blood pressure (!) 163/103, pulse 85, temperature 98 F (36.7 C), resp. rate 16, height 5\' 3"  (1.6 m), weight 45.4 kg, SpO2 97 %. Constitutional: No distress . Vital signs reviewed. HEENT: NCAT, EOMI, oral membranes moist Neck: supple Cardiovascular: RRR without murmur. No JVD    Respiratory/Chest: CTA Bilaterally without wheezes or rales. Normal effort    GI/Abdomen: BS +, non-tender, non-distended Ext: no clubbing, cyanosis, or edema Psych: pleasant and cooperative Skin: Warm and dry.  Intact. Musc: No edema in extremities.  No tenderness in extremities. Mild sublux right shoulder Neuro: Alert Makes eye contact.   Exp>>rec word finding deficits,  apraxia, spoke in almost a complete sentence to me in response to question today Motor: RUE 0/5, RLE trace quad, hip Sensation diminished to light touch right side   Assessment/Plan: 1. Functional deficits which require 3+ hours per day of interdisciplinary therapy in a comprehensive inpatient rehab setting. Physiatrist is providing close team supervision and 24 hour management of active medical problems listed below. Physiatrist and rehab team continue to assess barriers to discharge/monitor patient progress toward functional and  medical goals  Care Tool:  Bathing    Body parts bathed by patient: Left arm, Abdomen, Chest, Face   Body parts bathed by helper: Right arm, Front perineal area, Buttocks, Right upper leg, Left upper leg, Right lower leg, Left lower leg     Bathing assist Assist Level: Total Assistance - Patient < 25%     Upper Body Dressing/Undressing Upper body dressing   What is the patient wearing?: Pull over shirt    Upper body assist Assist Level: Total Assistance - Patient < 25%    Lower Body Dressing/Undressing Lower body dressing      What is the patient wearing?: Pants     Lower body assist Assist for lower body dressing: Dependent - Patient 0%     Toileting Toileting    Toileting assist Assist for toileting: Total Assistance - Patient < 25%     Transfers Chair/bed transfer  Transfers assist     Chair/bed transfer assist level: Maximal Assistance - Patient 25 - 49%     Locomotion Ambulation   Ambulation assist      Assist level: 2 helpers Assistive device: Hand held assist (L hall rail) Max distance: 20'   Walk 10 feet activity   Assist     Assist level: 2 helpers Assistive  device: Hand held assist (L hall rail)   Walk 50 feet activity   Assist Walk 50 feet with 2 turns activity did not occur: Safety/medical concerns         Walk 150 feet activity   Assist Walk 150 feet activity did not occur: Safety/medical concerns         Walk 10 feet on uneven surface  activity   Assist Walk 10 feet on uneven surfaces activity did not occur: Safety/medical concerns         Wheelchair     Assist Is the patient using a wheelchair?: Yes Type of Wheelchair: Manual    Wheelchair assist level: Dependent - Patient 0% Max wheelchair distance: 150'    Wheelchair 50 feet with 2 turns activity    Assist        Assist Level: Dependent - Patient 0%   Wheelchair 150 feet activity     Assist      Assist Level: Dependent -  Patient 0%   Blood pressure (!) 163/103, pulse 85, temperature 98 F (36.7 C), resp. rate 16, height 5\' 3"  (1.6 m), weight 45.4 kg, SpO2 97 %. Medical Problem List and Plan: 1.  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  Antithrombotics: -DVT/anticoagulation: Eliquis [employer]: Other (comment)             -antiplatelet therapy: N/A 3. Pain Management: Tylenol as needed 4. Mood: Wellbutrin 75 mg twice daily             -antipsychotic agents: N/A 5. Neuropsych: This patient is not capable of making decisions on her own behalf. 6. Skin/Wound Care: Routine skin checks 7. Fluids/Electrolytes/Nutrition: encourage PO  -BUN improved. continue FW thru tube  -protein for low albumin  9/22 intake does look better after dcing TF---continue with plan  -check bmet and prealbumin monday 8.  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 12/03/2020-   -MBS demonstrated improvement in oropharyngeal function with apraxia  -D1 nectar initiated 9/9, meds via PEG, adv per SLP 9.  Atrial fibrillation.  Eliquis.  Amiodarone 200 mg daily, Lopressor 12.5 mg twice daily.  Cardiac rate controlled at present 10.  Hyperlipidemia.  Zetia 11.  COPD. Stable -simplifying regimen to  Pulmicort and prn duoneb  Not requiring supplemental oxygen currently    12.  Acute on chronic diastolic congestive heart failure.     Filed Weights   12/31/20 0500 01/02/21 0548 01/04/21 0500  Weight: 50 kg 47.2 kg 45.4 kg    Weights 9/22 13. HTN:     BP improved until this morning on metoprolol 25mg  bid   -follow for trend  14. Constipation   - stools soft--holding miralax, also holding senna-s at hs  LOS: 14 days A FACE TO FACE EVALUATION WAS PERFORMED  10/22 01/04/2021, 10:11 AM

## 2021-01-04 NOTE — Progress Notes (Signed)
Physical Therapy Session Note  Patient Details  Name: Beverly Li MRN: [id number] Date of Birth: [birth year]  Today's Date: 01/04/2021 PT Individual Time: 1130-1200 PT Individual Time Calculation (min): 30 min   Short Term Goals: Week 2:  PT Short Term Goal 1 (Week 2): Pt will complete bed to chair transfers consistently with modA +1. PT Short Term Goal 2 (Week 2): Pt will ambulate 23' with modA +2.  Skilled Therapeutic Interventions/Progress Updates:     Patient in w/c in the room upon PT arrival. Patient alert and agreeable to PT session. Patient denied pain during session.  Patient verbalized several 2-5 word phrases during session. Required increased time and trials for longer phrases. Provided cues for continued attempts and focusing on one word at a time or trying a different word or phrase with the same meaning with good success from patient. Noted increased phonation and reduced latency with yes/no responses.   Donned R DF wrap, gait belt, and R Giv Mohr sling for gait training at beginning of session.   Therapeutic Activity: Transfers: Patient performed sit to/from stand x2 with min A without AD with PT blocking R knee. Noted improved proximal activation in R LE with transfers and gait since last week. Provided verbal cues for scooting forward, forward weight shift, and R knee/hip/trunk extension.  Gait Training:  Patient ambulated 52 feet with min A +2 using L HHA with PT on R facilitating weight shift, R hamstring activation, and foot placement, patient performed limb advancement with 0-25% assist throughout. Provided mod multimodal cues for erect posture, sequencing, and leading with her R heel in swing to promote increased step length. She then ambulated ~15 feet with mod A of 1 person without L HHA, however, patient with R knee buckling requiring max A to correct and patient verbally requested to sit after.   Patient in w/c with her husband in the room awaiting lunch tray  with SLP at end of session with breaks locked and all needs within reach.   Therapy Documentation Precautions:  Precautions Precautions: Fall, Other (comment) Precaution Comments: R hemiplegia with R shoulder subluxation, PEG, adominal binder; bladder/bowel incontinence Restrictions Weight Bearing Restrictions: No    Therapy/Group: Individual Therapy  Lewanna Petrak L Pierina Schuknecht PT, DPT  01/04/2021, 4:11 PM

## 2021-01-04 NOTE — Progress Notes (Signed)
Physical Therapy Session Note  Patient Details  Name: Beverly Li MRN: [id number] Date of Birth: Jun 24, 1945  Today's Date: 01/04/2021 PT Individual Time: 3244-0102 PT Individual Time Calculation (min): 55 min   Short Term Goals: Week 2:  PT Short Term Goal 1 (Week 2): Pt will complete bed to chair transfers consistently with modA +1. PT Short Term Goal 2 (Week 2): Pt will ambulate 71' with modA +2.  Skilled Therapeutic Interventions/Progress Updates: Pt presented in w/c with family present agreeable to therapy. Pt denies pain during session however rest breaks provided for fatigue as needed. Pt transported to day room and participated in gait training and NRM via forced use with weight shifting. Pt setup with ace bandage to provide dorsiflexion and eversion assist. PTA also set up pt in Giv-mor sling on LUE. Pt then participated in ambulation 97f and 375fwith +1 HHA and PTA on R providing facilitation at hips, assist to advance RLE, and facilitation at R knee to promote extension in stance phase. Pt was able to advance RLE during first few steps of each bout then required mod fading to max A with distance. Pt also performed Sit to stand x3 from w/c with LLE placed on 3in step to increase weight shifting to R for increased recruitment. Pt transported back to room and family requesting to transfer back to bed. Pt performed Stedy transfer back to bed requiring maxA for Sit to stand. Pt required maxA for sit to supine however pt was able bring LLE onto bed and once in supine perform lateral bridges to reposition to center of bed. Pt left in bed at end of session with bed alarm on, soft touch bell within reach and current needs met.      Therapy Documentation Precautions:  Precautions Precautions: Fall, Other (comment) Precaution Comments: R hemiplegia with R shoulder subluxation, PEG, adominal binder; bladder/bowel incontinence Restrictions Weight Bearing Restrictions: No General:   Vital  Signs: Therapy Vitals Temp: 97.9 F (36.6 C) Temp Source: Oral Pulse Rate: 75 BP: 117/89 Patient Position (if appropriate): Sitting Oxygen Therapy SpO2: 95 % O2 Device: Room Air Pain:   Mobility:   Locomotion :    Trunk/Postural Assessment :    Balance:   Exercises:   Other Treatments:      Therapy/Group: Individual Therapy  Malkie Wille 01/04/2021, 2:12 PM

## 2021-01-04 NOTE — Progress Notes (Signed)
Occupational Therapy Weekly Progress Note  Patient Details  Name: Beverly Li MRN: [id number] Date of Birth: 1945-07-20  Beginning of progress report period: December 22, 2020 End of progress report period: January 04, 2021  Today's Date: 01/04/2021 OT Individual Time: 1031-1130 OT Individual Time Calculation (min): 59 min    Patient has met 3 of 3 short term goals.  Patient is making steady progress towards OT goals. Pt is able to complete stand-pivot transfers with max A of 1 person. Sit><stands are progressing to mod A. R UE continues to be flaccid, but she has demonstrated improved overall attention to R side.  Patient continues to demonstrate the following deficits: muscle weakness, decreased cardiorespiratoy endurance, impaired timing and sequencing, abnormal tone, unbalanced muscle activation, motor apraxia, ataxia, decreased coordination, and decreased motor planning, decreased attention to right, right side neglect, and decreased motor planning, decreased initiation, decreased attention, decreased awareness, decreased problem solving, decreased safety awareness, decreased memory, and delayed processing, and decreased sitting balance, decreased standing balance, decreased postural control, hemiplegia, and decreased balance strategies and therefore will continue to benefit from skilled OT intervention to enhance overall performance with BADL and Reduce care partner burden.  Patient progressing toward long term goals..  Continue plan of care.  OT Short Term Goals Week 2:  OT Short Term Goal 1 (Week 2): Patient will perofrm sit<>stand at the sink with mod A in preparation for BADL task OT Short Term Goal 2 (Week 2): Patient will perform self ROM of R UE with mod intructional cues OT Short Term Goal 3 (Week 2): Patient will tolerate standing for 2 minutes in preparation for BADL task Week 3:     Skilled Therapeutic Interventions/Progress Updates:    Pt greeted semi-reclined in bed  with spouse present. Pt able to utter 10 word sentence when OT asked how she was this morning. Pt stated " I am having a hard time getting my words out." Per spouse, LB bathing already completed by nursing. Pt completed LB dressing seated EOB with OT assist to get R LE into figure 4 position. Improved initiation to thread pant legs. Sit<>stand at EOB with mod A and OT assist to get up pants. UB bathing/dressing from wc at the sink. Hand over hand A to integrate L UE for neuro re-ed within BADL tasks. Pt initiated threading R UE into shirt sleeve. Pt brought to therapy gym and addressed memory with image/word task. OT had pt reach each word aloud, then press the simulated correlating picture. Pt returned to room and left seated in wc with spouse present, R UE on 1/2 lap tray and needs met.  OT placed SAEBO e-stim. SAEBO left on for 60 minutes. OT returned to remove SAEBO with skin intact and no adverse reactions.  Saebo Stim One 330 pulse width 35 Hz pulse rate On 8 sec/ off 8 sec Ramp up/ down 2 sec Symmetrical Biphasic wave form  Max intensity 186m at 500 Ohm load   Therapy Documentation Precautions:  Precautions Precautions: Fall, Other (comment) Precaution Comments: R hemiplegia with R shoulder subluxation, PEG, adominal binder; bladder/bowel incontinence Restrictions Weight Bearing Restrictions: No Pain:  No pain reported   Therapy/Group: Individual Therapy  EValma Cava9/22/2022, 11:34 AM

## 2021-01-04 NOTE — Progress Notes (Signed)
Speech Language Pathology Daily Session Note  Patient Details  Name: Beverly Li MRN: [id number] Date of Birth: 16-Feb-1946  Today's Date: 01/04/2021  Session 1: SLP Individual Time: 0900-0930 SLP Individual Time Calculation (min): 30 min  Session 2: SLP Individual Time: 0093-8182 SLP Individual Time Calculation (min): 35 min  Short Term Goals: Week 2: SLP Short Term Goal 1 (Week 2): Patient will consume thin liquids with minimal overt s/s of aspiration with Min verbal cues for use of small sips via cup. SLP Short Term Goal 2 (Week 2): Patient will demonstrate efficient mastication and complete oral clearance with Min verbal cues with trials of Dys. 2 textures without overt s/s of aspiration over 2 sessions prior to upgrade. SLP Short Term Goal 3 (Week 2): Patient will read aloud at the word level with 75% accuracy and Mod multimodal cues. SLP Short Term Goal 4 (Week 2): Patient will utilize her communication board to express wants/needs in 50% of opportunities with Mod multimodal cues. SLP Short Term Goal 5 (Week 2): Patient will write at the word level with 25% accuracy and Max A multimodal cues. SLP Short Term Goal 6 (Week 2): Patient will demonstrate reading comprehension at the phrase level with 50% accuracy and Mod verbal cues.  Skilled Therapeutic Interventions:  Session 1: Skilled treatment session focused on communication goals. Patient read aloud at the word level with 100% accuracy and Min sentence completion cues for word-finding. Patient also read aloud at the phrase level with extra time and Mod verbal and visual cues and completed phrase closure tasks with 100% accuracy and Mod verbal cues. Overall, patient is making excellent gains and continues to improve the amount and length of utterances with spontaneous verbalizations. Patient left upright in bed with husband present. Continue with current plan of care.   Session 2: Skilled treatment session focused on dysphagia goals.  SLP facilitated session by providing skilled observation with lunch meal of Dys. 2 textures with nectar-thick liquids. Patient demonstrated efficient mastication with complete oral clearance without overt s/s of aspiration but required Min verbal cues for small bites and a slow rate of self-feeding. Recommend patient upgrade to Dys. 2 textures with full supervision to maximize safety with PO intake. Patient left upright in wheelchair with friend present. Continue with current plan of care.   Pain Session 1: No/Denies pain  Session 2: No/Denies Pain   Therapy/Group: Individual Therapy  Ascencion Coye 01/04/2021, 3:41 PM

## 2021-01-05 MED ORDER — METOPROLOL TARTRATE 25 MG PO TABS
37.5000 mg | ORAL_TABLET | Freq: Two times a day (BID) | ORAL | Status: DC
  Administered 2021-01-05 – 2021-01-08 (×6): 37.5 mg
  Filled 2021-01-05 (×6): qty 1

## 2021-01-05 NOTE — Progress Notes (Signed)
Speech Language Pathology Weekly Progress and Session Note  Patient Details  Name: KRYSTIN KEEVEN MRN: [id number] Date of Birth: 1946/03/23  Beginning of progress report period: December 29, 2020 End of progress report period: January 05, 2021  Today's Date: 01/05/2021 SLP Individual Time: 3335-4562 SLP Individual Time Calculation (min): 45 min  Short Term Goals: Week 2: SLP Short Term Goal 1 (Week 2): Patient will consume thin liquids with minimal overt s/s of aspiration with Min verbal cues for use of small sips via cup. SLP Short Term Goal 1 - Progress (Week 2): Met SLP Short Term Goal 2 (Week 2): Patient will demonstrate efficient mastication and complete oral clearance with Min verbal cues with trials of Dys. 2 textures without overt s/s of aspiration over 2 sessions prior to upgrade. SLP Short Term Goal 2 - Progress (Week 2): Met SLP Short Term Goal 3 (Week 2): Patient will read aloud at the word level with 75% accuracy and Mod multimodal cues. SLP Short Term Goal 3 - Progress (Week 2): Met SLP Short Term Goal 4 (Week 2): Patient will utilize her communication board to express wants/needs in 50% of opportunities with Mod multimodal cues. SLP Short Term Goal 4 - Progress (Week 2): Met SLP Short Term Goal 5 (Week 2): Patient will write at the word level with 25% accuracy and Max A multimodal cues. SLP Short Term Goal 5 - Progress (Week 2): Not met SLP Short Term Goal 6 (Week 2): Patient will demonstrate reading comprehension at the phrase level with 50% accuracy and Mod verbal cues. SLP Short Term Goal 6 - Progress (Week 2): Met    New Short Term Goals: Week 3: SLP Short Term Goal 1 (Week 3): Patient will consume thin liquids with minimal overt s/s of aspiration with supervision verbal cues for use of small sips via cup over 2 sessions to assess readiness for upgrade. SLP Short Term Goal 2 (Week 3): Patient will consume current diet with minimal overt s/s of aspiration with Min  verbal cues for use of swallowing compnesatory strategies. SLP Short Term Goal 3 (Week 3): Patient will write at the word level with 25% accuracy and Max A multimodal cues. SLP Short Term Goal 4 (Week 3): Patient will demonstrate reading comprehension at the phrase level with 75% accuracy and Min verbal cues. SLP Short Term Goal 5 (Week 3): Patient will verbalize wants/needs at the phrase level with overall Min verbal cues.  Weekly Progress Updates: Patient has made excellent gains and has met 5 of 6 STGs this reporting period. Patient has been upgraded to Dys. 2 textures. Patient with minimal overt s/s of aspiration but requires overall Mod A verbal and visual cues for use of swallowing compensatory strategies. Patient continues to require Min verbal cues for use of small sips with thin liquids and hopeful for upgrade within this next reporting period. Patient continues to demonstrate improved language abilities. Patient is attempting to verbalize wants/needs at the phrase and sentence level but often requires more than a reasonable amount of time. Patient can read aloud at the phrase level and complete phrase closure tasks with Mod A multimodal cues. Patient and family education ongoing. Patient would benefit from continued skilled SLP intervention to maximize her cognitive-linguistic and swallowing function prior to discharge.      Intensity: Minumum of 1-2 x/day, 30 to 90 minutes Frequency: 3 to 5 out of 7 days Duration/Length of Stay: 10/7 Treatment/Interventions: Cognitive remediation/compensation;Dysphagia/aspiration precaution training;Speech/Language facilitation;Therapeutic Activities;Environmental controls;Cueing hierarchy;Functional tasks;Patient/family education  Daily Session  Skilled Therapeutic Interventions:  Skilled treatment session focused on dysphagia and communication goals. SLP facilitated session by providing skilled observation with cup sips of thin liquids. Patient  continues to demonstrate intermittent s/s of aspiration with thin liquids, suspect due to difficulty controlling bolus size. Patient may be appropriate for a provale cup.   Recommend ongoing trials with SLP. SLP also facilitated session by providing overall Min A verbal cues to self-monitor and correct phonemic errors when reading aloud at the phrase level. Min verbal cues were also needed for word-finding with 75% accuracy. SLP then switched to an informal conversation that focused on biographical information. Patient independently verbalized her birthday but required Max A verbal cues for verbalization of age and place where she lives. Increased difficulty was noted with identifying family members, suspect due to fatigue. Patient left upright in bed with alarm on and all needs within reach. Continue with current plan of care.     Pain No/Denies Pain   Therapy/Group: Individual Therapy  Kymoni Lesperance 01/05/2021, 6:14 AM

## 2021-01-05 NOTE — Progress Notes (Signed)
Went into this patients room for rounds and needed to removed her PRAFO boot in order to change her and make her comfortable. Once I was finished I attempted to place her PRAFO boot back on and this patient grabbed the boot and told me "NO". I educated her and stressed the importance of wearing the boot and she told me "I do no care" and would not let me place the boot.

## 2021-01-05 NOTE — Progress Notes (Signed)
Occupational Therapy Session Note  Patient Details  Name: Beverly Li MRN: [id number] Date of Birth: 10-14-1945  Today's Date: 01/05/2021 OT Individual Time: 1000-1056 OT Individual Time Calculation (min): 56 min    Short Term Goals: Week 1:  OT Short Term Goal 1 (Week 1): Patient will maintain sitting balance with mod A during functional BADL task OT Short Term Goal 1 - Progress (Week 1): Met OT Short Term Goal 2 (Week 1): Patient will perofrm sit<>stand at the sink with mod A in preparation for BADL task OT Short Term Goal 2 - Progress (Week 1): Progressing toward goal OT Short Term Goal 3 (Week 1): Patient will complete 1 step of UB dressing task with mod questioning cues. OT Short Term Goal 3 - Progress (Week 1): Met  Skilled Therapeutic Interventions/Progress Updates:     Pt received in w/c with no pain. ADL: Pt completes ADL at overall MIN A Level for UB ADLS and grooming. Skilled interventions include: step by step  multinmodal cuing for hemi dressing strategies d/t poor attention and awareness to R side body and sink. Pt requirs MOD HOH A for raching far R past misline with LUE and total A to incorporate RUE into bimanual tasks. Overall significanlty increased time provided for pt to process and execute cues d/t poor initiation and praxis.  Neuromuscular Reeducation Pt completes scap mobility/facilitation of scap at w/c with mirror for visual feedback. Occasional trace activation noted with elevation/retraction. VC for looking at mirror while executing movements to see hemi body d/t impaired sensation. Saebo placed on RUE- shoulder/deltoid. Skin in tact at end of stim session.  Saebo Stim One 330 pulse width 35 Hz pulse rate On 8 sec/ off 8 sec Ramp up/ down 2 sec Symmetrical Biphasic wave form  Max intensity 152m at 500 Ohm load  Pt left at end of session in w/c with exit alarm on, call light in reach and all needs met   Therapy Documentation Precautions:   Precautions Precautions: Fall, Other (comment) Precaution Comments: R hemiplegia with R shoulder subluxation, PEG, adominal binder; bladder/bowel incontinence Restrictions Weight Bearing Restrictions: No   Therapy/Group: Individual Therapy  STonny Branch9/23/2022, 6:48 AM

## 2021-01-05 NOTE — Progress Notes (Signed)
Physical Therapy Weekly Progress Note  Patient Details  Name: Beverly Li MRN: [id number] Date of Birth: 06-Nov-1945  Beginning of progress report period: December 28, 2020 End of progress report period: January 05, 2021  Today's Date: 01/05/2021 PT Individual Time: 4098-1191 PT Individual Time Calculation (min): 69 min   Patient has met 2 of 2 short term goals.  Pt iw making good progress toward mobility goals, improving independence with bed mobility, functional transfers, balance, and ambulation. Pt has demonstrated some NM return in R lower extremity as evidenced by motor activation of extensor groups during stance phase of ambulation. Pt has not yet been able to activate R hemibody for discreet movements, but consistently able to perform automatic activation. Pt's husband is in room consistently and will benefit from family education closer to time of discharge.  Patient continues to demonstrate the following deficits muscle weakness, decreased cardiorespiratoy endurance, decreased coordination and decreased motor planning, delayed processing, and decreased sitting balance, decreased standing balance, decreased postural control, hemiplegia, and decreased balance strategies and therefore will continue to benefit from skilled PT intervention to increase functional independence with mobility.  Patient progressing toward long term goals..  Continue plan of care.  PT Short Term Goals Week 2:  PT Short Term Goal 1 (Week 2): Pt will complete bed to chair transfers consistently with modA +1. PT Short Term Goal 1 - Progress (Week 2): Met PT Short Term Goal 2 (Week 2): Pt will ambulate 75' with modA +2. PT Short Term Goal 2 - Progress (Week 2): Met Week 3:  PT Short Term Goal 1 (Week 3): Pt will perform bed mobility with modA +1. PT Short Term Goal 2 (Week 3): Pt will perform bed to chair transfer consistently with minA. PT Short Term Goal 3 (Week 3): Pt will ambulate 15' with modA +1 and  LRAD.  Skilled Therapeutic Interventions/Progress Updates:  Ambulation/gait training;Community reintegration;DME/adaptive equipment instruction;Neuromuscular re-education;Psychosocial support;Stair training;UE/LE Strength taining/ROM;Wheelchair propulsion/positioning;Balance/vestibular training;Discharge planning;Functional electrical stimulation;Pain management;Skin care/wound management;Therapeutic Activities;UE/LE Coordination activities;Cognitive remediation/compensation;Disease management/prevention;Functional mobility training;Patient/family education;Splinting/orthotics;Therapeutic Exercise;Visual/perceptual remediation/compensation   Pt received supine in bed and agrees to therapy. No complaint of pain. PT threads pants over each leg and pt performs supine bridge with cues for  Supine to sit with modA/maxA and pt demonstrating good initiation of movement, with PT providing multimodal cues for sequencing and positioning. Pt performs stand step transfer to [facility] with modA and demonstrating heavy posterior bias, with PT providing manual facilitation of lateral weight shifting to clear contralateral foot. WC transport outside for gait training over unlevel ground. Once outside, pt indicates that she is too cold and requests to go back inside, so outdoor gait training deferred at this time. Once inside, PT assists pt with donning giv-mohr sling on R upper extremity. Pt performs sit to stand with modA +2 HHA on L side and pt ambulates x60' with PT providing light blocking of R knee and occasional assistance progressing R leg during swing phase. PT facilitates lateral weight shifting in both directions, and when pt adequately shifts weight into L leg during stance phase, she is typically able to progress R leg without manual assistance. Following seated rest break, pt ambulates x40' with modA +1 and use of quad cane. Pt's L knee buckles with fatigue and requires +2 to bring WC for safety, though otherwise  completes with modA +1.   Pt completes NMR for L lower extremity, standing with quad cane and modA, and tapping L foot on 6 inch step to encourage full  weight shifting to the R leg. Pt completes 2x5 with modA for balance due to posterior bias, as well as assistance with lateral weight shifting.  Pt left seated in WC with alarm intact and all needs within reach.  Therapy Documentation Precautions:  Precautions Precautions: Fall, Other (comment) Precaution Comments: R hemiplegia with R shoulder subluxation, PEG, adominal binder; bladder/bowel incontinence Restrictions Weight Bearing Restrictions: No  Therapy/Group: Individual Therapy  Breck Coons, PT, DPT 01/05/2021, 4:16 PM

## 2021-01-05 NOTE — Progress Notes (Signed)
PROGRESS NOTE   Subjective/Complaints:  Pt up in therapy working on taking a step. No new issues  Review of Systems  Unable to perform ROS: Language , no changes  Objective:   No results found. No results for input(s): WBC, HGB, HCT, PLT in the last 72 hours.   No results for input(s): NA, K, CL, CO2, GLUCOSE, BUN, CREATININE, CALCIUM in the last 72 hours.   Intake/Output Summary (Last 24 hours) at 01/05/2021 1124 Last data filed at 01/05/2021 0700 Gross per 24 hour  Intake 165 ml  Output --  Net 165 ml          Physical Exam: Vital Signs Blood pressure (!) 139/99, pulse 71, temperature 97.6 F (36.4 C), temperature source Oral, resp. rate 18, height 5\' 3"  (1.6 m), weight 45.4 kg, SpO2 96 %. Constitutional: No distress . Vital signs reviewed. HEENT: NCAT, EOMI, oral membranes moist Neck: supple Cardiovascular: RRR without murmur. No JVD    Respiratory/Chest: CTA Bilaterally without wheezes or rales. Normal effort    GI/Abdomen: BS +, non-tender, non-distended Ext: no clubbing, cyanosis, or edema Psych: appears to be in reasonable spirits Skin: Warm and dry.  Intact. Musc: No edema in extremities.  No tenderness in extremities. Mild sublux right shoulder Neuro: Alert Makes eye contact.   Exp>>rec word finding deficits,  completes short sentences slowly with dysarthria and apraxia Motor: RUE 0/5, RLE trace quad, hip Sensation diminished to light touch right side   Assessment/Plan: 1. Functional deficits which require 3+ hours per day of interdisciplinary therapy in a comprehensive inpatient rehab setting. Physiatrist is providing close team supervision and 24 hour management of active medical problems listed below. Physiatrist and rehab team continue to assess barriers to discharge/monitor patient progress toward functional and medical goals  Care Tool:  Bathing    Body parts bathed by patient: Left arm,  Abdomen, Chest, Face   Body parts bathed by helper: Right arm, Front perineal area, Buttocks, Right upper leg, Left upper leg, Right lower leg, Left lower leg     Bathing assist Assist Level: Total Assistance - Patient < 25%     Upper Body Dressing/Undressing Upper body dressing   What is the patient wearing?: Pull over shirt    Upper body assist Assist Level: Total Assistance - Patient < 25%    Lower Body Dressing/Undressing Lower body dressing      What is the patient wearing?: Pants     Lower body assist Assist for lower body dressing: Dependent - Patient 0%     Toileting Toileting    Toileting assist Assist for toileting: Total Assistance - Patient < 25%     Transfers Chair/bed transfer  Transfers assist     Chair/bed transfer assist level: Maximal Assistance - Patient 25 - 49%     Locomotion Ambulation   Ambulation assist      Assist level: 2 helpers Assistive device: Hand held assist (L hall rail) Max distance: 20'   Walk 10 feet activity   Assist     Assist level: 2 helpers Assistive device: Hand held assist (L hall rail)   Walk 50 feet activity   Assist Walk 50 feet with  2 turns activity did not occur: Safety/medical concerns         Walk 150 feet activity   Assist Walk 150 feet activity did not occur: Safety/medical concerns         Walk 10 feet on uneven surface  activity   Assist Walk 10 feet on uneven surfaces activity did not occur: Safety/medical concerns         Wheelchair     Assist Is the patient using a wheelchair?: Yes Type of Wheelchair: Manual    Wheelchair assist level: Dependent - Patient 0% Max wheelchair distance: 150'    Wheelchair 50 feet with 2 turns activity    Assist        Assist Level: Dependent - Patient 0%   Wheelchair 150 feet activity     Assist      Assist Level: Dependent - Patient 0%   Blood pressure (!) 139/99, pulse 71, temperature 97.6 F (36.4 C),  temperature source Oral, resp. rate 18, height 5\' 3"  (1.6 m), weight 45.4 kg, SpO2 96 %. Medical Problem List and Plan: 1.  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  Antithrombotics: -DVT/anticoagulation: Eliquis [employer]: Other (comment)             -antiplatelet therapy: N/A 3. Pain Management: Tylenol as needed 4. Mood: Wellbutrin 75 mg twice daily             -antipsychotic agents: N/A 5. Neuropsych: This patient is not capable of making decisions on her own behalf. 6. Skin/Wound Care: Routine skin checks 7. Fluids/Electrolytes/Nutrition: encourage PO  -BUN improved. continue FW thru tube  -protein for low albumin  9/23 intake better last 2 days, best with breakfast  -check bmet and prealbumin monday 8.  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 12/03/2020-   -MBS demonstrated improvement in oropharyngeal function with apraxia  -D1 nectar initiated 9/9--advanced to D2 yesterday. 9.  Atrial fibrillation.  Eliquis.  Amiodarone 200 mg daily, Lopressor 12.5 mg twice daily.  Cardiac rate controlled at present 10.  Hyperlipidemia.  Zetia 11.  COPD. Stable -simplifying regimen to  Pulmicort and prn duoneb  Not requiring supplemental oxygen currently    12.  Acute on chronic diastolic congestive heart failure.     Filed Weights   12/31/20 0500 01/02/21 0548 01/04/21 0500  Weight: 50 kg 47.2 kg 45.4 kg    Weights trending down. Spoke with pt/husband. Continue to push po 13. HTN:     BP trending back up on lopressor 25mg  bid   -increase to 37.5mg  bid 9/23  14. Constipation   - stools soft--holding miralax, also holding senna-s at hs  LOS: 15 days A FACE TO FACE EVALUATION WAS PERFORMED  01/05/2021, 11:24 AM

## 2021-01-06 NOTE — Progress Notes (Signed)
Pt slept poorly throughout the night, falling asleep in the later am hours. Denies pain. Incontinent of urine throughout the night. Turned and repositioned throughout the night. Pt compliant with resting hand splint and PRAFO boot.

## 2021-01-06 NOTE — Progress Notes (Signed)
PROGRESS NOTE   Subjective/Complaints:  A little restless last night. Up getting ready for breakfast. Husband in room  Review of Systems  Unable to perform ROS: Language , no changes  Objective:   No results found. No results for input(s): WBC, HGB, HCT, PLT in the last 72 hours.   No results for input(s): NA, K, CL, CO2, GLUCOSE, BUN, CREATININE, CALCIUM in the last 72 hours.   Intake/Output Summary (Last 24 hours) at 01/06/2021 0834 Last data filed at 01/06/2021 0801 Gross per 24 hour  Intake 240 ml  Output --  Net 240 ml          Physical Exam: Vital Signs Blood pressure (!) 153/93, pulse 81, temperature 97.7 F (36.5 C), temperature source Oral, resp. rate 16, height 5\' 3"  (1.6 m), weight 44.2 kg, SpO2 98 %. Constitutional: No distress . Vital signs reviewed. HEENT: NCAT, EOMI, oral membranes moist Neck: supple Cardiovascular: RRR without murmur. No JVD    Respiratory/Chest: CTA Bilaterally without wheezes or rales. Normal effort    GI/Abdomen: BS +, non-tender, non-distended Ext: no clubbing, cyanosis, or edema Psych: appears in reasonable spirits Skin: Warm and dry.  Intact. Musc: No edema in extremities.  No tenderness in extremities. Mild sublux right shoulder Neuro: Alert Makes eye contact.   Exp>>rec word finding deficits,  occasional appropriate words/phrases Motor: RUE 0/5, RLE trace quad, hip Sensation diminished to light touch right side   Assessment/Plan: 1. Functional deficits which require 3+ hours per day of interdisciplinary therapy in a comprehensive inpatient rehab setting. Physiatrist is providing close team supervision and 24 hour management of active medical problems listed below. Physiatrist and rehab team continue to assess barriers to discharge/monitor patient progress toward functional and medical goals  Care Tool:  Bathing    Body parts bathed by patient: Left arm, Abdomen,  Chest, Face   Body parts bathed by helper: Right arm, Front perineal area, Buttocks, Right upper leg, Left upper leg, Right lower leg, Left lower leg     Bathing assist Assist Level: Total Assistance - Patient < 25%     Upper Body Dressing/Undressing Upper body dressing   What is the patient wearing?: Pull over shirt    Upper body assist Assist Level: Total Assistance - Patient < 25%    Lower Body Dressing/Undressing Lower body dressing      What is the patient wearing?: Pants     Lower body assist Assist for lower body dressing: Dependent - Patient 0%     Toileting Toileting    Toileting assist Assist for toileting: Total Assistance - Patient < 25%     Transfers Chair/bed transfer  Transfers assist     Chair/bed transfer assist level: Maximal Assistance - Patient 25 - 49%     Locomotion Ambulation   Ambulation assist      Assist level: 2 helpers Assistive device: Hand held assist (L hall rail) Max distance: 20'   Walk 10 feet activity   Assist     Assist level: 2 helpers Assistive device: Hand held assist (L hall rail)   Walk 50 feet activity   Assist Walk 50 feet with 2 turns activity did not occur:  Safety/medical concerns         Walk 150 feet activity   Assist Walk 150 feet activity did not occur: Safety/medical concerns         Walk 10 feet on uneven surface  activity   Assist Walk 10 feet on uneven surfaces activity did not occur: Safety/medical concerns         Wheelchair     Assist Is the patient using a wheelchair?: Yes Type of Wheelchair: Manual    Wheelchair assist level: Dependent - Patient 0% Max wheelchair distance: 150'    Wheelchair 50 feet with 2 turns activity    Assist        Assist Level: Dependent - Patient 0%   Wheelchair 150 feet activity     Assist      Assist Level: Dependent - Patient 0%   Blood pressure (!) 153/93, pulse 81, temperature 97.7 F (36.5 C), temperature  source Oral, resp. rate 16, height 5\' 3"  (1.6 m), weight 44.2 kg, SpO2 98 %. Medical Problem List and Plan: 1.  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  Antithrombotics: -DVT/anticoagulation: Eliquis [employer]: Other (comment)             -antiplatelet therapy: N/A 3. Pain Management: Tylenol as needed 4. Mood: Wellbutrin 75 mg twice daily             -antipsychotic agents: N/A 5. Neuropsych: This patient is not capable of making decisions on her own behalf. 6. Skin/Wound Care: Routine skin checks 7. Fluids/Electrolytes/Nutrition: encourage PO  -BUN improved. continue FW thru tube  -protein for low albumin  9/24 intake better last 3 days, best with breakfast  -check bmet and prealbumin monday 8.  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 12/03/2020-   -MBS demonstrated improvement in oropharyngeal function with apraxia  -D1 nectar initiated 9/9--advanced to D2   9.  Atrial fibrillation.  Eliquis.  Amiodarone 200 mg daily, Lopressor 12.5 mg twice daily.  Cardiac rate controlled at present 10.  Hyperlipidemia.  Zetia 11.  COPD. Stable -simplifying regimen to  Pulmicort and prn duoneb  Not requiring supplemental oxygen currently    12.  Acute on chronic diastolic congestive heart failure.     Filed Weights   01/02/21 0548 01/04/21 0500 01/06/21 0333  Weight: 47.2 kg 45.4 kg 44.2 kg    Weights trending down. Have Spoken with pt/husband. Continue to push po 13. HTN:     BP has been trending back up on lopressor 25mg  bid   9/24-increased to 37.5mg  bid 9/23   -prn hydralazine   -educated husband that we want to avoid over treating bp to avoid hypoperfusion.   14. Constipation   - stools soft--holding miralax, also holding senna-s at hs  LOS: 16 days A  FACE TO FACE EVALUATION WAS PERFORMED  01/06/2021, 8:34 AM

## 2021-01-07 NOTE — Plan of Care (Signed)
  Problem: Consults Goal: RH GENERAL PATIENT EDUCATION Description: See Patient Education module for education specifics. Outcome: Progressing   Problem: RH BOWEL ELIMINATION Goal: RH STG MANAGE BOWEL WITH ASSISTANCE Description: STG Manage Bowel with min Assistance. Outcome: Progressing Goal: RH STG MANAGE BOWEL W/MEDICATION W/ASSISTANCE Description: STG Manage Bowel with Medication with mod I Assistance. Outcome: Progressing   Problem: RH BLADDER ELIMINATION Goal: RH STG MANAGE BLADDER WITH ASSISTANCE Description: STG Manage Bladder With min Assistance Outcome: Progressing   Problem: RH SAFETY Goal: RH STG ADHERE TO SAFETY PRECAUTIONS W/ASSISTANCE/DEVICE Description: STG Adhere to Safety Precautions With  cues Assistance/Device. Outcome: Progressing   Problem: RH KNOWLEDGE DEFICIT GENERAL Goal: RH STG INCREASE KNOWLEDGE OF SELF CARE AFTER HOSPITALIZATION Description: Patient's family will be able to manage care and nutritional means using handouts and educational resources independently Outcome: Progressing   Problem: Consults Goal: RH STROKE PATIENT EDUCATION Description: See Patient Education module for education specifics  Outcome: Progressing

## 2021-01-07 NOTE — Progress Notes (Signed)
Physical Therapy Session Note  Patient Details  Name: Beverly Li MRN: [id number] Date of Birth: May 22, 1945  Today's Date: 01/07/2021 PT Individual Time: 1450-1540 PT Individual Time Calculation (min): 50 min   Short Term Goals: Week 3:  PT Short Term Goal 1 (Week 3): Pt will perform bed mobility with modA +1. PT Short Term Goal 2 (Week 3): Pt will perform bed to chair transfer consistently with minA. PT Short Term Goal 3 (Week 3): Pt will ambulate 14' with modA +1 and LRAD.  Skilled Therapeutic Interventions/Progress Updates:  0902: Patient in bed with RN providing medications via PEG tube. Patient's husband reported patient slept poorly last night, and just got to sleep prior to med administration. Patient reported "something woke me up, then I couldn't go back to sleep." Patient requested to rest at this time due to lack of sleep. Will attempt to rearrange schedule to return to patient this afternoon.    1450: PT returned to make up missed session from this morning. Patient in bed with her husband at bedside upon PT arrival. Patient alert and agreeable to PT session. Patient denied pain during session.  Patient verbalized several 3-6 word phrases during session. Required increased time and trials for longer phrases. Provided cues for continued attempts and focusing on one word at a time or trying a different word or phrase with the same meaning with good success from patient. Noted increased phonation and reduced latency with yes/no responses.    Donned B socks, tennis shoes, R DF wrap, and gait belt with patient sitting EOB for trunk control in unsupported sitting.    Therapeutic Activity: Bed Mobility: Patient performed supine to sit with mod A to the R in a flat bed without use of bed rails. Provided multimodal cues for rolling through R side-lying, setting R elbow for trunk control, and rolling L shoulder forward to assist with pushing up from the bed to sitting. Transfers: Patient  performed stand pivot bed>w/c using RW with R hand splint with mod A due to increased posterior bias and decreased motor planning. Provided max multimodal cues for sequencing, weight shifting and AD management. She performed sit to/from stand x1 using RW with min A with PT blocking R knee. Noted improved proximal activation in R LE. Provided verbal cues for scooting forward, forward weight shift, and R knee/hip/trunk extension. Required max A  for R reciprocal scooting today due to fatigue.    Gait Training:  Patient ambulated 22 feet with min A progressing to mod A +2 using RW with R hand splint with PT facilitating weight shift, R hamstring activation, and foot placement, patient performed limb advancement with ~50-75%% assist throughout. Provided mod multimodal cues for erect posture, sequencing, and leading with her R heel in swing to promote increased step length. Attempted to turn to sit on mat table at end and patient began saying "Oh, oh, oh, oh..." Patient sat early and was supported with total A on PT's knee, able to scoot back onto the mat table with max A. Patient denied pain x3 then clearly stated, "I don't want you to think I am a chicken." PT educated on appropriate fear of falling challenges of trunk control with gait training with RW. Discussed use of harness system to reduce fear of falling and promote increased gait distances, patient in agreement to trial tomorrow during PT.   Patient's husband asked about w/c recommendations at d/c. Reported that PT would be recommending a w/c and would discuss custom w/c options  with lead PT. Patient's husband very interested in custom options and reports that he is looking at a w/c accessible van to purchase.    Patient in w/c with her husband in the room at end of session with breaks locked, seat belt alarm set, and all needs within reach.  Patient missed 10 min of skilled PT due to fatigue, RN made aware. Will attempt to make-up missed time as able.     Therapy Documentation Precautions:  Precautions Precautions: Fall, Other (comment) Precaution Comments: R hemiplegia with R shoulder subluxation, PEG, adominal binder; bladder/bowel incontinence Restrictions Weight Bearing Restrictions: No    Therapy/Group: Individual Therapy  Beverly Li L Aime Carreras PT, DPT  01/07/2021, 4:07 PM

## 2021-01-07 NOTE — Progress Notes (Signed)
Speech Language Pathology Daily Session Note  Patient Details  Name: Beverly Li MRN: [id number] Date of Birth: June 17, 1945  Today's Date: 01/07/2021 SLP Individual Time: 5974-1638 SLP Individual Time Calculation (min): 48 min  Short Term Goals: Week 3: SLP Short Term Goal 1 (Week 3): Patient will consume thin liquids with minimal overt s/s of aspiration with supervision verbal cues for use of small sips via cup over 2 sessions to assess readiness for upgrade. SLP Short Term Goal 2 (Week 3): Patient will consume current diet with minimal overt s/s of aspiration with Min verbal cues for use of swallowing compnesatory strategies. SLP Short Term Goal 3 (Week 3): Patient will write at the word level with 25% accuracy and Max A multimodal cues. SLP Short Term Goal 4 (Week 3): Patient will demonstrate reading comprehension at the phrase level with 75% accuracy and Min verbal cues. SLP Short Term Goal 5 (Week 3): Patient will verbalize wants/needs at the phrase level with overall Min verbal cues.  Skilled Therapeutic Interventions: Pt seen for skilled ST with focus on speech and swallowing goals. Pt husband present at beginning and end of session, left during bulk of tx. SLP offered to transfer pt to recliner per husband request however pt politely declined. Trialed thin liquids via cup with pt benefiting from Supervision A cues for small sips, no overt s/s aspiration. SLP trialed Provale 10cc cup which patient enjoyed utilizing, no overt s/s aspiration across ~3 oz thin liquid. Recommend continued trials with rate controled cup with goal to upgrade to thin liquids when appropriate. Pt able to accurately and intelligibly name pictures on 11/15 trials, min A phonemic cues increased to 15/15. Pt able to ID which picture didn't match the others in field of 4 with 25% accuracy. Pt continues to benefit from extra time and cues to speak in 1-3 word phrases during unstructured conversation. Husband asking if pt  would be appropriate to trial meds orally vs via PEG tube. Pt left in bed with alarm set and husband present. Cont ST POC.    Pain Pain Assessment Pain Scale: 0-10 Pain Score: 0-No pain  Therapy/Group: Individual Therapy  Tacey Ruiz 01/07/2021, 3:18 PM

## 2021-01-08 LAB — CBC WITH DIFFERENTIAL/PLATELET
Abs Immature Granulocytes: 0.03 10*3/uL (ref 0.00–0.07)
Basophils Absolute: 0.1 10*3/uL (ref 0.0–0.1)
Basophils Relative: 1 %
Eosinophils Absolute: 0.1 10*3/uL (ref 0.0–0.5)
Eosinophils Relative: 1 %
HCT: 51.6 % — ABNORMAL HIGH (ref 36.0–46.0)
Hemoglobin: 16.6 g/dL — ABNORMAL HIGH (ref 12.0–15.0)
Immature Granulocytes: 0 %
Lymphocytes Relative: 30 %
Lymphs Abs: 2.3 10*3/uL (ref 0.7–4.0)
MCH: 30.9 pg (ref 26.0–34.0)
MCHC: 32.2 g/dL (ref 30.0–36.0)
MCV: 95.9 fL (ref 80.0–100.0)
Monocytes Absolute: 0.6 10*3/uL (ref 0.1–1.0)
Monocytes Relative: 7 %
Neutro Abs: 4.8 10*3/uL (ref 1.7–7.7)
Neutrophils Relative %: 61 %
Platelets: 283 10*3/uL (ref 150–400)
RBC: 5.38 MIL/uL — ABNORMAL HIGH (ref 3.87–5.11)
RDW: 15.6 % — ABNORMAL HIGH (ref 11.5–15.5)
WBC: 7.8 10*3/uL (ref 4.0–10.5)
nRBC: 0 % (ref 0.0–0.2)

## 2021-01-08 LAB — BASIC METABOLIC PANEL
Anion gap: 9 (ref 5–15)
BUN: 27 mg/dL — ABNORMAL HIGH (ref 8–23)
CO2: 28 mmol/L (ref 22–32)
Calcium: 9.4 mg/dL (ref 8.9–10.3)
Chloride: 103 mmol/L (ref 98–111)
Creatinine, Ser: 0.86 mg/dL (ref 0.44–1.00)
GFR, Estimated: >60 mL/min (ref >60)
Glucose, Bld: 84 mg/dL (ref 70–99)
Potassium: 3.8 mmol/L (ref 3.5–5.1)
Sodium: 140 mmol/L (ref 135–145)

## 2021-01-08 LAB — PREALBUMIN: Prealbumin: 30.2 mg/dL (ref 18–38)

## 2021-01-08 MED ORDER — METOPROLOL TARTRATE 50 MG PO TABS
50.0000 mg | ORAL_TABLET | Freq: Two times a day (BID) | ORAL | Status: DC
  Administered 2021-01-08 – 2021-01-15 (×14): 50 mg
  Filled 2021-01-08 (×14): qty 1

## 2021-01-08 NOTE — Progress Notes (Signed)
PROGRESS NOTE   Subjective/Complaints:  Looks to have had reasonable night. Denies pain. Husband at bedside  Review of Systems  Unable to perform ROS: Language  no change  Objective:   No results found. No results for input(s): WBC, HGB, HCT, PLT in the last 72 hours.   No results for input(s): NA, K, CL, CO2, GLUCOSE, BUN, CREATININE, CALCIUM in the last 72 hours.   Intake/Output Summary (Last 24 hours) at 01/08/2021 0825 Last data filed at 01/08/2021 0815 Gross per 24 hour  Intake 640 ml  Output --  Net 640 ml          Physical Exam: Vital Signs Blood pressure (!) 128/99, pulse 67, temperature 97.8 F (36.6 C), resp. rate 17, height 5\' 3"  (1.6 m), weight 45.1 kg, SpO2 93 %. Constitutional: No distress . Vital signs reviewed. HEENT: NCAT, EOMI, oral membranes moist Neck: supple Cardiovascular: RRR without murmur. No JVD    Respiratory/Chest: CTA Bilaterally without wheezes or rales. Normal effort    GI/Abdomen: BS +, non-tender, non-distended, PEG site clean Ext: no clubbing, cyanosis, or edema Psych: pleasant and cooperative  Skin: Warm and dry.  Intact. Musc: No edema in extremities.  No tenderness in extremities. Mild sublux right shoulder Neuro: Alert Makes eye contact.   Exp>>rec word finding deficits, more automatic, appropriate responses, dysarthric Motor: RUE 0/5, RLE trace to 1/5 quad, hip extension Sensation diminished to light touch right side   Assessment/Plan: 1. Functional deficits which require 3+ hours per day of interdisciplinary therapy in a comprehensive inpatient rehab setting. Physiatrist is providing close team supervision and 24 hour management of active medical problems listed below. Physiatrist and rehab team continue to assess barriers to discharge/monitor patient progress toward functional and medical goals  Care Tool:  Bathing    Body parts bathed by patient: Left arm,  Abdomen, Chest, Face   Body parts bathed by helper: Right arm, Front perineal area, Buttocks, Right upper leg, Left upper leg, Right lower leg, Left lower leg     Bathing assist Assist Level: Total Assistance - Patient < 25%     Upper Body Dressing/Undressing Upper body dressing   What is the patient wearing?: Pull over shirt    Upper body assist Assist Level: Total Assistance - Patient < 25%    Lower Body Dressing/Undressing Lower body dressing      What is the patient wearing?: Pants     Lower body assist Assist for lower body dressing: Dependent - Patient 0%     Toileting Toileting    Toileting assist Assist for toileting: Total Assistance - Patient < 25%     Transfers Chair/bed transfer  Transfers assist     Chair/bed transfer assist level: Minimal Assistance - Patient > 75%     Locomotion Ambulation   Ambulation assist      Assist level: Moderate Assistance - Patient 50 - 74% Assistive device: Walker-rolling (R hand splint) Max distance: 22'   Walk 10 feet activity   Assist     Assist level: Moderate Assistance - Patient - 50 - 74% Assistive device: Walker-rolling   Walk 50 feet activity   Assist Walk 50 feet with 2  turns activity did not occur: Safety/medical concerns         Walk 150 feet activity   Assist Walk 150 feet activity did not occur: Safety/medical concerns         Walk 10 feet on uneven surface  activity   Assist Walk 10 feet on uneven surfaces activity did not occur: Safety/medical concerns         Wheelchair     Assist Is the patient using a wheelchair?: Yes Type of Wheelchair: Manual    Wheelchair assist level: Dependent - Patient 0% Max wheelchair distance: 150'    Wheelchair 50 feet with 2 turns activity    Assist        Assist Level: Dependent - Patient 0%   Wheelchair 150 feet activity     Assist      Assist Level: Dependent - Patient 0%   Blood pressure (!) 128/99,  pulse 67, temperature 97.8 F (36.6 C), resp. rate 17, height 5\' 3"  (1.6 m), weight 45.1 kg, SpO2 93 %. Medical Problem List and Plan: 1.  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  Antithrombotics: -DVT/anticoagulation: Eliquis [employer]: Other (comment)             -antiplatelet therapy: N/A 3. Pain Management: Tylenol as needed 4. Mood: Wellbutrin 75 mg twice daily             -antipsychotic agents: N/A 5. Neuropsych: This patient is not capable of making decisions on her own behalf. 6. Skin/Wound Care: Routine skin checks 7. Fluids/Electrolytes/Nutrition: encourage PO  -BUN improved. continue FW thru tube  -protein for low albumin  9/26 intake fair, labs pending for today 8.  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 12/03/2020-   -MBS demonstrated improvement in oropharyngeal function with apraxia  -D 2  diet 9.  Atrial fibrillation.  Eliquis.  Amiodarone 200 mg daily, Lopressor 12.5 mg twice daily.  Cardiac rate controlled at present 10.  Hyperlipidemia.  Zetia 11.  COPD. Stable -simplifying regimen to  Pulmicort and prn duoneb  Not requiring supplemental oxygen currently    12.  Acute on chronic diastolic congestive heart failure.     Filed Weights   01/04/21 0500 01/06/21 0333 01/08/21 0500  Weight: 45.4 kg 44.2 kg 45.1 kg    Weights trending down. Have Spoken with pt/husband. Continue to push po 13. HTN:     BP has been trending back up on lopressor 25mg  bid   9/24-increased to 37.5mg  bid 9/23   -prn hydralazine   9/26-DBP still elevated although SBP controlled -will adjust lopressor to 50mg  bid--observe   14. Constipation   - stools soft--holding miralax, also holding senna-s at hs  LOS: 18 days A FACE TO FACE EVALUATION WAS  PERFORMED  01/08/2021, 8:25 AM

## 2021-01-08 NOTE — Progress Notes (Signed)
Speech Language Pathology Daily Session Note  Patient Details  Name: Beverly Li MRN: [id number] Date of Birth: 12-02-1945  Today's Date: 01/08/2021 SLP Individual Time: 1000-1045 SLP Individual Time Calculation (min): 45 min  Short Term Goals: Week 3: SLP Short Term Goal 1 (Week 3): Patient will consume thin liquids with minimal overt s/s of aspiration with supervision verbal cues for use of small sips via cup over 2 sessions to assess readiness for upgrade. SLP Short Term Goal 2 (Week 3): Patient will consume current diet with minimal overt s/s of aspiration with Min verbal cues for use of swallowing compnesatory strategies. SLP Short Term Goal 3 (Week 3): Patient will write at the word level with 25% accuracy and Max A multimodal cues. SLP Short Term Goal 4 (Week 3): Patient will demonstrate reading comprehension at the phrase level with 75% accuracy and Min verbal cues. SLP Short Term Goal 5 (Week 3): Patient will verbalize wants/needs at the phrase level with overall Min verbal cues.  Skilled Therapeutic Interventions: Skilled treatment session focused on communication goals. Upon arrival, patient was upright in the wheelchair and appeared mildly fatigued. Patient had received cards and required extra time and overall Max A multimodal cues to read aloud. Patient also attempted to spontaneously verbalize at the phrase level with decreased intelligibility noted.  Patient named functional items, pictures of objects and read at the word level with overall Mod A multimodal cues in 75% accuracy with difficulty noted with phonemes /s/ and /k/. Patient attempting to communicate events from morning session at the sentence level with increased difficulty noted. Due to this, patient unable to fully communicate thought. Patient left upright in wheelchair with alarm on and all needs within reach. Continue with current plan of care.      Pain Pain Assessment Pain Scale: 0-10 Pain Score: 0-No  pain  Therapy/Group: Individual Therapy  Sophiamarie Nease 01/08/2021, 11:59 AM

## 2021-01-08 NOTE — Progress Notes (Signed)
Occupational Therapy Session Note  Patient Details  Name: Beverly Li MRN: [id number] Date of Birth: December 14, 1945  Today's Date: 01/08/2021 OT Individual Time: 0900-1000 OT Individual Time Calculation (min): 60 min   Short Term Goals: Week 3:  OT Short Term Goal 1 (Week 3): Pt will perform mod A transfer to [facility] OT Short Term Goal 2 (Week 3): Pt will use R UE as s stabilizer with mod A OT Short Term Goal 3 (Week 3): Pt will perform UB dressing with mod A  Skilled Therapeutic Interventions/Progress Updates:    Pt greeted semi-reclined in bed with spouse present and agreeable to OT treatment session. Pt agreeable to shower today. Addressed sit<>stand and stand-pivot transfers with overall mod A. Bathing completed from shower bench with hand over hand A to integrate R UE for neuro re-ed. Pt able to wash upper legs, but needed OT assist for lower legs and to balance when leaning for OT to wash buttocks. Dresssing completed from wc at the sink with blocked practice for hemi dressing techniques with mod/max A. Sit<>stands at the sink with mod A, then OT assist to pull up pants. UB endurance with hair drying task and OT encouragement to complete task in its entirety. Pt left seated in wc wirth R UE supported on 1/2 lap tray and needs met.   OT placed SAEBO e-stim placed on R shoulder for subluxation. SAEBO left on for 60 minutes. OT returned to remove SAEBO with skin intact and no adverse reactions.  [place] Stim One 330 pulse width 35 Hz pulse rate On 8 sec/ off 8 sec Ramp up/ down 2 sec Symmetrical Biphasic wave form  Max intensity 172m at 500 Ohm load   Therapy Documentation Precautions:   Pain: Pain Assessment Pain Scale: 0-10 Pain Score: 0-No pain    Therapy/Group: Individual Therapy  EValma Cava9/26/2022, 10:03 AM

## 2021-01-08 NOTE — Progress Notes (Signed)
Physical Therapy Session Note  Patient Details  Name: Beverly Li MRN: [id number] Date of Birth: [birth year]  Today's Date: 01/08/2021 PT Individual Time: 1300-1415 PT Individual Time Calculation (min): 75 min   Short Term Goals: Week 3:  PT Short Term Goal 1 (Week 3): Pt will perform bed mobility with modA +1. PT Short Term Goal 2 (Week 3): Pt will perform bed to chair transfer consistently with minA. PT Short Term Goal 3 (Week 3): Pt will ambulate 2' with modA +1 and LRAD.  Skilled Therapeutic Interventions/Progress Updates:    Pt received seated in bed, agreeable to PT session. No complaints of pain. Seated in bed to sitting EOB with mod A needed for RLE management and trunk elevation. Squat pivot transfer to the L with max A. Squat pivot transfer to/from mat table with mod A. Sit to/from supine with mod A. Rolling L/R on mat table with min A for placement of LiteGait sling, placed towel wrap around PEG tube. Applied RLE DF assist ACE wrap. Session focus on gait training with use of LiteGait over treadmill. Attempt to have pt take steps with treadmill at 0.5 mph, pt unable to follow cues to take steps quickly enough to keep up with treadmill. Attempt to have pt take forward/backward steps with LLE with manual assist for R knee blocking and lateral weight shift onto RLE. Pt appears fearful and unable to take steps with LLE on treadmill. Deferred further use of LiteGait this session due to pt being fearful and unable to take steps on treadmill. Ambulation x 15 ft, x 10 ft with RW and mod A for balance, dependent to advance RLE, close w/c follow for safety. Pt exhibits improved ability to take steps with RW vs with LiteGait this date. Pt requests to return to bed at end of session due to fatigue. Squat pivot transfer w/c to bed with mod A to the R. Sit to supine min A needed for RLE management. Pt left semi-reclined in bed with needs in reach, bed alarm in place.  Therapy  Documentation Precautions:  Precautions Precautions: Fall, Other (comment) Precaution Comments: R hemiplegia with R shoulder subluxation, PEG, adominal binder; bladder/bowel incontinence Restrictions Weight Bearing Restrictions: No     Therapy/Group: Individual Therapy   Peter Congo, PT, DPT, CSRS  01/08/2021, 5:03 PM

## 2021-01-08 NOTE — Progress Notes (Signed)
Physical Therapy Session Note  Patient Details  Name: Beverly Li MRN: [id number] Date of Birth: 03/13/1946  Today's Date: 01/08/2021 PT Individual Time: 1130-1200 PT Individual Time Calculation (min): 30 min   Short Term Goals: Week 1:  PT Short Term Goal 1 (Week 1): Pt will complete bed mobility with modA. PT Short Term Goal 1 - Progress (Week 1): Progressing toward goal PT Short Term Goal 2 (Week 1): Pt will complete sit to stand transfer with modA +1. PT Short Term Goal 2 - Progress (Week 1): Met PT Short Term Goal 3 (Week 1): Pt will complete bed to chair transfer with modA +1. PT Short Term Goal 3 - Progress (Week 1): Progressing toward goal PT Short Term Goal 4 (Week 1): Pt will ambulate x50' with modA +2. PT Short Term Goal 4 - Progress (Week 1): Progressing toward goal PT Short Term Goal 5 (Week 1): Pt will complete bed mobility consistently with modA. Week 2:  PT Short Term Goal 1 (Week 2): Pt will complete bed to chair transfers consistently with modA +1. PT Short Term Goal 1 - Progress (Week 2): Met PT Short Term Goal 2 (Week 2): Pt will ambulate 65' with modA +2. PT Short Term Goal 2 - Progress (Week 2): Met  Skilled Therapeutic Interventions/Progress Updates:   Pt initially oob inw wc.  Denies pain.  Transported to gym.  Pt attempts to speak using single word to short phrases during session w/varied amounts of difficulty.  Worked on standing balance in parallel bars including static stand w/therapist manually extending R knee/protracting R hip to neutral/tonal resistance to movement but gradually able to achieve neutral.    Standing with and without LUE support, tends to lean progressively post and to R without support, worked on awareness/placing and holding neutral position.  Advancedd to static stand w/light reaching to stherapist shoulder/top of head gradually added opposite shoulder and continueing pattern of movement while maintaiing balance.    Pt encouraged  to verbalized during session/rest breaks.  Pt left oob in wc w/alarm belt set and needs in reach.  Husband at side.   Therapy Documentation Precautions:  Precautions Precautions: Fall, Other (comment) Precaution Comments: R hemiplegia with R shoulder subluxation, PEG, adominal binder; bladder/bowel incontinence Restrictions Weight Bearing Restrictions: No     Therapy/Group: Individual Therapy  Jerrilyn Cairo 01/08/2021, 12:25 PM

## 2021-01-09 MED ORDER — FREE WATER
350.0000 mL | Freq: Four times a day (QID) | Status: DC
  Administered 2021-01-09 – 2021-01-10 (×5): 350 mL
  Administered 2021-01-10: 300 mL
  Administered 2021-01-10 – 2021-01-11 (×5): 350 mL
  Administered 2021-01-12: 250 mL

## 2021-01-09 MED ORDER — BOOST / RESOURCE BREEZE PO LIQD CUSTOM
1.0000 | Freq: Three times a day (TID) | ORAL | Status: DC
  Administered 2021-01-09 – 2021-01-10 (×3): 1 via ORAL

## 2021-01-09 MED ORDER — PROSOURCE PLUS PO LIQD
30.0000 mL | Freq: Three times a day (TID) | ORAL | Status: DC
  Administered 2021-01-09 – 2021-01-15 (×16): 30 mL via ORAL
  Filled 2021-01-09 (×16): qty 30

## 2021-01-09 NOTE — Progress Notes (Signed)
Nutrition Follow-up  DOCUMENTATION CODES:   Severe malnutrition in context of chronic illness  INTERVENTION:  Provide 30 ml Prosource plus po TID, each supplement provides 100 kcal and 15 grams of protein.   Provide Boost Breeze po TID, each supplement provides 250 kcal and 9 grams of protein  Encourage PO intake.   NUTRITION DIAGNOSIS:   Severe Malnutrition related to chronic illness (CHF) as evidenced by severe muscle depletion, severe fat depletion; ongoing  GOAL:   Patient will meet greater than or equal to 90% of their needs; progressing  MONITOR:   PO intake, Supplement acceptance, Diet advancement, Labs, Weight trends, Skin, I & O's  REASON FOR ASSESSMENT:   Consult Enteral/tube feeding initiation and management  ASSESSMENT:   75-year  female with history of chronic diastolic congestive heart failure, atrial fibrillation maintained on Eliquis, Bell's palsy, hypertension, OSA, prediabetes, right femoral-popliteal bypass graft stenosis followed by vascular surgery, recent hospitalization 8/1-8/24/2022 for 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 Meal completion has been varied from 25-100%. Family at bedside has been encouraging pt po intake. Pt with some dislike of hospital food. Family plans to bring in foods pt enjoys eating. Pt currently has Ensure ordered however has been refusing them recently. Pt reports dislike of Ensure. RD to order Boost Breeze instead to aid in caloric and protein needs. Will continue with Prosource plus to aid in protein needs.   Labs and medications reviewed.   Diet Order:   Diet Order             DIET DYS 2 Room service appropriate? Yes; Fluid consistency: Nectar Thick  Diet effective 1000                   EDUCATION NEEDS:   Not appropriate for education at this  time  Skin:  Skin Assessment: Reviewed RN Assessment  Last BM:  9/26  Height:   Ht Readings from Last 1 Encounters:  12/21/20 5\' 3"  (1.6 m)    Weight:   Wt Readings from Last 1 Encounters:  01/09/21 45.1 kg   BMI:  Body mass index is 17.61 kg/m.  Estimated Nutritional Needs:   Kcal:  1600-1800  Protein:  80-90 grams  Fluid:  >/= 1.6 L/day  01/11/21, MS, RD, LDN RD pager number/after hours weekend pager number on Amion.

## 2021-01-09 NOTE — Progress Notes (Signed)
Speech Language Pathology Daily Session Note  Patient Details  Name: Beverly Li MRN: [id number] Date of Birth: 1945-06-30  Today's Date: 01/09/2021 SLP Individual Time: 1420-1515 SLP Individual Time Calculation (min): 55 min  Short Term Goals: Week 3: SLP Short Term Goal 1 (Week 3): Patient will consume thin liquids with minimal overt s/s of aspiration with supervision verbal cues for use of small sips via cup over 2 sessions to assess readiness for upgrade. SLP Short Term Goal 2 (Week 3): Patient will consume current diet with minimal overt s/s of aspiration with Min verbal cues for use of swallowing compnesatory strategies. SLP Short Term Goal 3 (Week 3): Patient will write at the word level with 25% accuracy and Max A multimodal cues. SLP Short Term Goal 4 (Week 3): Patient will demonstrate reading comprehension at the phrase level with 75% accuracy and Min verbal cues. SLP Short Term Goal 5 (Week 3): Patient will verbalize wants/needs at the phrase level with overall Min verbal cues.  Skilled Therapeutic Interventions: Skilled treatment session focused on dysphagia and communication goals. SLP facilitated session by providing trials of thin liquids via a 10cc Provale cup. Patient consumed trials without overt s/s of aspiration and indicated that she enjoyed using the cup. Recommend continued thicken liquids with meals but thin liquids via the Provale cup in between meals. Husband aware and in agreement. SLP also facilitated session by providing Min A visual and phonemic cues for self-monitoring and correcting phonemic errors during a structured naming task. Patient completed task with 100% accuracy. Patient left upright in bed with alarm on and all needs within reach. Continue with current plan of care.      Pain No/Denies Pain   Therapy/Group: Individual Therapy  Shristi Scheib 01/09/2021, 3:54 PM

## 2021-01-09 NOTE — Progress Notes (Addendum)
Occupational Therapy Session Note  Patient Details  Name: Beverly Li MRN: [id number] Date of Birth: [birth year]   Today's Date: 01/09/2021 OT Individual Time: 1540-0867 OT Individual Time Calculation (min): 56 min   Short Term Goals: Week 1:  OT Short Term Goal 1 (Week 1): Patient will maintain sitting balance with mod A during functional BADL task OT Short Term Goal 1 - Progress (Week 1): Met OT Short Term Goal 2 (Week 1): Patient will perofrm sit<>stand at the sink with mod A in preparation for BADL task OT Short Term Goal 2 - Progress (Week 1): Progressing toward goal OT Short Term Goal 3 (Week 1): Patient will complete 1 step of UB dressing task with mod questioning cues. OT Short Term Goal 3 - Progress (Week 1): Met Week 2:  OT Short Term Goal 1 (Week 2): Patient will perofrm sit<>stand at the sink with mod A in preparation for BADL task OT Short Term Goal 1 - Progress (Week 2): Met OT Short Term Goal 2 (Week 2): Patient will perform self ROM of R UE with mod intructional cues OT Short Term Goal 2 - Progress (Week 2): Met OT Short Term Goal 3 (Week 2): Patient will tolerate standing for 2 minutes in preparation for BADL task OT Short Term Goal 3 - Progress (Week 2): Met Week 3:  OT Short Term Goal 1 (Week 3): Pt will perform mod A transfer to [facility] OT Short Term Goal 2 (Week 3): Pt will use R UE as s stabilizer with mod A OT Short Term Goal 3 (Week 3): Pt will perform UB dressing with mod A  Skilled Therapeutic Interventions/Progress Updates:    Pt received in bed, with husband present, agreeable to OT and no c/o pain. Language improved this tx session with multiple attempts at full sentences. Extra time allowed to offer pt the chance to communicate as able.   ADL: Pt completes BADL at overall MOD-MAX A level. Skilled interventions include:  Pt came to EOB from supine with MIN A, requiring posterior support for static sitting balance and MIN A for scooting hips forward on  hemi-side. Stand > pivot from bed > wc completed with MOD A. R hemi-side positioned on sink for weight bearing during dressing tasks. Doffed shirt requiring MOD A for hemi-technique. Able to wash R side of body with L hand. Incorporated R hand with MAX A hand-over-hand from OTS to include in bimanual tasks (putting on deodorant). Pt also stated "I can't use this arm" when asked to use washcloth to bathe UB. OTS facilitated WB through R hemi-elbow while assisting with washing L side of UB. Required MOD A to don shirt using hemi-technique. Needed educational cuing for putting L arm in first, then R side. Pt able to follow commands and made attempts to vocalize. Was able to indicate yes/no during tx. Pt states "I made a mess in my pants" when putting on shirt. OT/OTS facilitated sit>stand with MOD A to perform peri-care and doffing brief. MAX A LB dressing with OTS facilitating distal threading of pants with pt pulling up over knees. MOD A in standing with knee block and R hand placed on sink for support to pull pants over hips. Shoes & socks donned with MAX A. Pt left at end of session in wc with spouse present, call light in reach and all needs met  OT placed SAEBO e-stim on shoulder for subluxation. SAEBO left on for 60 minutes. OT returned to remove SAEBO with skin intact and  no adverse reactions.  [place] Stim One 330 pulse width 35 Hz pulse rate On 8 sec/ off 8 sec Ramp up/ down 2 sec Symmetrical Biphasic wave form  Max intensity 174m at 500 Ohm load   Therapy Documentation Precautions:  Precautions Precautions: Fall, Other (comment) Precaution Comments: R hemiplegia with R shoulder subluxation, PEG, adominal binder; bladder/bowel incontinence Restrictions Weight Bearing Restrictions: No     Therapy/Group: Individual Therapy  Jenn Worischeck 01/09/2021, 12:21 PM

## 2021-01-09 NOTE — Progress Notes (Signed)
PROGRESS NOTE   Subjective/Complaints:  Up with OT at sink. No new issues overnight  Review of Systems  Unable to perform ROS: Language  no change  Objective:   No results found. Recent Labs    01/08/21 0810  WBC 7.8  HGB 16.6*  HCT 51.6*  PLT 283     Recent Labs    01/08/21 0810  NA 140  K 3.8  CL 103  CO2 28  GLUCOSE 84  BUN 27*  CREATININE 0.86  CALCIUM 9.4     Intake/Output Summary (Last 24 hours) at 01/09/2021 0905 Last data filed at 01/08/2021 1834 Gross per 24 hour  Intake 120 ml  Output --  Net 120 ml          Physical Exam: Vital Signs Blood pressure (!) 153/113, pulse 77, temperature 98 F (36.7 C), resp. rate 17, height 5\' 3"  (1.6 m), weight 45.1 kg, SpO2 90 %. Constitutional: No distress . Vital signs reviewed. HEENT: NCAT, EOMI, oral membranes moist Neck: supple Cardiovascular: RRR without murmur. No JVD    Respiratory/Chest: CTA Bilaterally without wheezes or rales. Normal effort    GI/Abdomen: BS +, non-tender, non-distended Ext: no clubbing, cyanosis, or edema Psych: pleasant and cooperative  Skin: Warm and dry.  Intact. Musc: No edema in extremities.  No tenderness in extremities. Mild sublux right shoulder Neuro: Alert Makes eye contact.   Exp>>rec word finding deficits,increasingly automatic, appropriate responses, dysarthric Motor: RUE 0/5, RLE trace to 1/5 quad, hip extension Sensation diminished to light touch right side   Assessment/Plan: 1. Functional deficits which require 3+ hours per day of interdisciplinary therapy in a comprehensive inpatient rehab setting. Physiatrist is providing close team supervision and 24 hour management of active medical problems listed below. Physiatrist and rehab team continue to assess barriers to discharge/monitor patient progress toward functional and medical goals  Care Tool:  Bathing    Body parts bathed by patient: Left arm,  Abdomen, Chest, Face   Body parts bathed by helper: Right arm, Front perineal area, Buttocks, Right upper leg, Left upper leg, Right lower leg, Left lower leg     Bathing assist Assist Level: Total Assistance - Patient < 25%     Upper Body Dressing/Undressing Upper body dressing   What is the patient wearing?: Pull over shirt    Upper body assist Assist Level: Total Assistance - Patient < 25%    Lower Body Dressing/Undressing Lower body dressing      What is the patient wearing?: Pants     Lower body assist Assist for lower body dressing: Dependent - Patient 0%     Toileting Toileting    Toileting assist Assist for toileting: Total Assistance - Patient < 25%     Transfers Chair/bed transfer  Transfers assist     Chair/bed transfer assist level: Minimal Assistance - Patient > 75%     Locomotion Ambulation   Ambulation assist      Assist level: Moderate Assistance - Patient 50 - 74% Assistive device: Walker-rolling (R hand splint) Max distance: 22'   Walk 10 feet activity   Assist     Assist level: Moderate Assistance - Patient - 88 -  74% Assistive device: Walker-rolling   Walk 50 feet activity   Assist Walk 50 feet with 2 turns activity did not occur: Safety/medical concerns         Walk 150 feet activity   Assist Walk 150 feet activity did not occur: Safety/medical concerns         Walk 10 feet on uneven surface  activity   Assist Walk 10 feet on uneven surfaces activity did not occur: Safety/medical concerns         Wheelchair     Assist Is the patient using a wheelchair?: Yes Type of Wheelchair: Manual    Wheelchair assist level: Dependent - Patient 0% Max wheelchair distance: 150'    Wheelchair 50 feet with 2 turns activity    Assist        Assist Level: Dependent - Patient 0%   Wheelchair 150 feet activity     Assist      Assist Level: Dependent - Patient 0%   Blood pressure (!) 153/113,  pulse 77, temperature 98 F (36.7 C), resp. rate 17, height 5\' 3"  (1.6 m), weight 45.1 kg, SpO2 90 %. Medical Problem List and Plan: 1.  Aphasia, dysphagia,  and right hemiparesis secondary to recent left MCA, PCA stroke, complicated by aspiration pneumonia  -Continue CIR therapies including PT, OT, and SLP , team conf  PRAFO/WHO qhs, Sling RUE ok for when OOB 2.  Antithrombotics: -DVT/anticoagulation: Eliquis [employer]: Other (comment)             -antiplatelet therapy: N/A 3. Pain Management: Tylenol as needed 4. Mood: Wellbutrin 75 mg twice daily             -antipsychotic agents: N/A 5. Neuropsych: This patient is not capable of making decisions on her own behalf. 6. Skin/Wound Care: Routine skin checks 7. Fluids/Electrolytes/Nutrition: encourage PO  -protein for low albumin  9/27 intake inconsistent, BUN sl up yesterday, incr H2O flushes 8.  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 12/03/2020-   -MBS demonstrated improvement in oropharyngeal function with apraxia  -D 2  diet 9.  Atrial fibrillation.  Eliquis.  Amiodarone 200 mg daily, Lopressor 12.5 mg twice daily.  Cardiac rate controlled at present 10.  Hyperlipidemia.  Zetia 11.  COPD. Stable -simplifying regimen to  Pulmicort and prn duoneb  Not requiring supplemental oxygen currently    12.  Acute on chronic diastolic congestive heart failure.     Filed Weights   01/06/21 0333 01/08/21 0500 01/09/21 0500  Weight: 44.2 kg 45.1 kg 45.1 kg    Weights trending down. Have Spoken with pt/husband. Continue to push po 13. HTN:     BP has been trending back up on lopressor 25mg  bid   9/24-increased to 37.5mg  bid 9/23   -prn hydralazine   9/27-BP elevated this morning, otherwise improved -continue lopressor 50mg  bid  -if persistent elevation will schedule hydralazine  14. Constipation   - stools soft--holding miralax, also holding senna-s at hs  LOS: 19 days A FACE TO FACE EVALUATION WAS  PERFORMED  01/09/2021, 9:05 AM

## 2021-01-09 NOTE — Progress Notes (Signed)
Patient ID: Beverly Li, female   DOB: Jun 02, 1945, 75 y.o.   MRN: [id number]  SW met with pt and pt husband in room to provide updates from team conference, and discuss plan of care for patient. Reports that he has secured a space at [facility] effective for 01/13/2021. SW informed will follow-up to discuss further. SW explained to pt husband pt transition would occur prior to d/c date since pt will be transitioning to a SNF. SW informed f/u with facility. Pt will be ambulance transport when ready for d/c to SNF.  SW left message for Erin/Marketing with [facility] [phone]) to discuss when patient can transition. SW waiting on follow-up.  Loralee Pacas, MSW, [place] Office: [phone] Cell: [phone] Fax: [phone]

## 2021-01-09 NOTE — Discharge Summary (Addendum)
Physician Discharge Summary  Patient ID: GLENDY BARSANTI MRN: [id number] DOB/AGE: 1945-06-18 75 y.o.  Admit date: 12/21/2020 Discharge date: 01/15/2021  Discharge Diagnoses:  Principal Problem:   Left middle cerebral artery stroke [facility]) Active Problems:   Acute on chronic diastolic (congestive) heart failure (HCC)   Dysphagia, post-stroke   Elevated BUN Mood stabilization Atrial fibrillation Hyperlipidemia COPD  Discharged Condition: Stable  Significant Diagnostic Studies: DG Swallowing Func-Speech Pathology  Result Date: 12/21/2020 Table formatting from the original result was not included. Objective Swallowing Evaluation: Type of Study: MBS-Modified Barium Swallow Study  Patient Details Name: CARDELIA SASSANO MRN: [id number] Date of Birth: 1945-05-05 Today's Date: 12/21/2020 Time: SLP Start Time (ACUTE ONLY): 1250 -SLP Stop Time (ACUTE ONLY): 1310 SLP Time Calculation (min) (ACUTE ONLY): 20 min Past Medical History: Past Medical History: Diagnosis Date  Anxiety   Arthritis   "some in my lower back; probably elbows, knees" (11/18/2017)  Atrial fibrillation (HCC)   Bell's palsy   when pt. was 75 yrs old, when under stress the left side of face will droop.  Complication of anesthesia   "vascular OR 2016; BP bottomed out; couldn't get it regulated; ended up in ICU for DAYS" (11/18/2017)  GERD (gastroesophageal reflux disease)   History of kidney stones   Hypertension   Hypertrophic cardiomyopathy (HCC)   severe LV basilar hypertrophy witn no evidence of significant outflow tract obstruction, EF 65-70%, mild LAE, mild TR, grade 1a diastolic dysfunction 05/15/10 (Dr. Donato Schultz) (Atrial Septal Hypertrophy pattern)-- Intra-op TEE with dsignificant outflow tract obstruction - AI, MR & TR  Insomnia   Mild aortic sclerosis   Osteopenia   Peripheral vascular disease (HCC)   Syncope   , Vagal Past Surgical History: Past Surgical History: Procedure Laterality Date  AUGMENTATION MAMMAPLASTY Bilateral   BACK SURGERY     CARDIAC CATHETERIZATION N/A 05/07/2016  Procedure: Left Heart Cath and Coronary Angiography;  Surgeon: Kathleene Hazel, MD;  Location: [facility] INVASIVE CV LAB;  Service: Cardiovascular;  Laterality: N/A;  CARDIOVERSION N/A 09/24/2017  Procedure: CARDIOVERSION;  Surgeon: Laurey Morale, MD;  Location: [facility] ENDOSCOPY;  Service: Cardiovascular;  Laterality: N/A;  DILATION AND CURETTAGE OF UTERUS    ENDARTERECTOMY FEMORAL Right 03/02/2015  Procedure: ENDARTERECTOMY RIGHT FEMORAL;  Surgeon: Chuck Hint, MD;  Location: [facility] OR;  Service: Vascular;  Laterality: Right;  ESOPHAGOGASTRODUODENOSCOPY (EGD) WITH PROPOFOL N/A 12/01/2020  Procedure: ESOPHAGOGASTRODUODENOSCOPY (EGD) WITH PROPOFOL;  Surgeon: Diamantina Monks, MD;  Location: [facility] ENDOSCOPY;  Service: General;  Laterality: N/A;  FACIAL COSMETIC SURGERY Left 2002  "related to Bell's Palsy @ age 27; left eye/side of face droopy; tried to make area symmetrical"  FEMORAL-POPLITEAL BYPASS GRAFT Right 03/02/2015  Procedure: BYPASS GRAFT FEMORAL-BELOW KNEE POPLITEAL ARTERY;  Surgeon: Chuck Hint, MD;  Location: [facility] OR;  Service: Vascular;  Laterality: Right;  INGUINAL HERNIA REPAIR Bilateral 2002  IR ANGIO INTRA EXTRACRAN SEL COM CAROTID INNOMINATE UNI L MOD SED  11/16/2020  IR CT HEAD LTD  11/13/2020  IR PERCUTANEOUS ART THROMBECTOMY/INFUSION INTRACRANIAL INC DIAG ANGIO  11/13/2020  OVARIAN CYST REMOVAL Left   PEG PLACEMENT N/A 12/01/2020  Procedure: PERCUTANEOUS ENDOSCOPIC GASTROSTOMY (PEG) PLACEMENT;  Surgeon: Diamantina Monks, MD;  Location: [facility] ENDOSCOPY;  Service: General;  Laterality: N/A;  PERIPHERAL VASCULAR CATHETERIZATION N/A 01/16/2015  Procedure: Abdominal Aortogram;  Surgeon: Chuck Hint, MD;  Location: [facility] INVASIVE CV LAB;  Service: Cardiovascular;  Laterality: N/A;  POSTERIOR LUMBAR FUSION  2015  "have plates and screws in there"  RADIOLOGY  WITH ANESTHESIA N/A 11/13/2020  Procedure: IR WITH ANESTHESIA;  Surgeon: Radiologist, Medication, MD;   Location: [facility] OR;  Service: Radiology;  Laterality: N/A;  TONSILLECTOMY   HPI: Pt is a 75 y.o. female recently d/c to [facility] for rehab after admission 11/13/20-12/06/20 for multiple infarcts requiring revascularization and intubation, now readmitted 12/12/20 with respiratory distress. CXR noted increased R pleural effusion, unchanged L pleural effusion. Workup for sepsis secondary to PNA; question possibility of bacterial pneumonia versus aspiration with possibility of compounding factor of CHF exacerbation. 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 MBS 8/24 with dx of severe primarily oral dysphagia with adequate ability to protect airway with nectar-thick and puree - recommended therapeutic trials of puree/teaspoons of nectar with SLP>  Subjective: alert Assessment / Plan / Recommendation CHL IP CLINICAL IMPRESSIONS 12/21/2020 Clinical Impression Pt presents with overall improved oropharyngeal function since 8/24 exam. She demonstrates improved awareness and intentional manipulation of solids and liquids.  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  Thin liquids spilled prematurely into pharynx, and on one instance there was trace aspiration - accompanied by a strong cough. Subsequent thin liquid boluses were swallowed with laryngeal vestibule closure occurring in time to protect the airway.  There was good pharyngeal stripping with no post-swallow residue with any consistency.  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  Results of exam/video were reviewed with pt and her husband and with [name], the SLP who will be working with pt on CIR.  Recommend initiating a  dysphagia 1 (pureed) diet with nectar thick liquids.  Pt may have sips of thin liquid outside of meals.  Give meds via PEG.  Dietitian to follow re: adjusting tube feedings. SLP should be able to advance diet based on clinical performance without repeating MBS.  SLP Visit Diagnosis Dysphagia, oropharyngeal phase (R13.12) Attention and concentration deficit following -- Frontal lobe and executive function deficit following -- Impact on safety and function Mild aspiration risk   CHL IP TREATMENT RECOMMENDATION 12/21/2020 Treatment Recommendations Defer treatment plan to f/u with SLP   Prognosis 12/13/2020 Prognosis for Safe Diet Advancement Guarded Barriers to Reach Goals Language deficits Barriers/Prognosis Comment -- CHL IP DIET RECOMMENDATION 12/21/2020 SLP Diet Recommendations Dysphagia 1 (Puree) solids;Nectar thick liquid Liquid Administration via Cup Medication Administration Via alternative means Compensations Minimize environmental distractions Postural Changes --   CHL IP OTHER RECOMMENDATIONS 12/21/2020 Recommended Consults -- Oral Care Recommendations Oral care BID Other Recommendations Have oral suction available   CHL IP FOLLOW UP RECOMMENDATIONS 12/21/2020 Follow up Recommendations Inpatient Rehab   CHL IP FREQUENCY AND DURATION 12/14/2020 Speech Therapy Frequency (ACUTE ONLY) min 2x/week Treatment Duration --      CHL IP ORAL PHASE 12/21/2020 Oral Phase Impaired Oral - Pudding Teaspoon -- Oral - Pudding Cup -- Oral - Honey Teaspoon -- Oral - Honey Cup -- Oral - Nectar Teaspoon -- Oral - Nectar Cup -- Oral - Nectar Straw -- Oral - Thin Teaspoon -- Oral - Thin Cup -- Oral - Thin Straw -- Oral - Puree -- Oral - Mech Soft -- Oral - Regular -- Oral - Multi-Consistency -- Oral - Pill -- Oral Phase - Comment --  CHL IP PHARYNGEAL PHASE 12/21/2020 Pharyngeal Phase Impaired  Pharyngeal- Pudding Teaspoon -- Pharyngeal -- Pharyngeal- Pudding Cup -- Pharyngeal -- Pharyngeal- Honey Teaspoon -- Pharyngeal -- Pharyngeal- Honey Cup --  Pharyngeal -- Pharyngeal- Nectar Teaspoon Delayed swallow initiation-vallecula;Delayed swallow initiation-pyriform sinuses Pharyngeal Material enters airway, remains ABOVE vocal cords then ejected out Pharyngeal- Nectar Cup Delayed swallow initiation-vallecula;Delayed swallow initiation-pyriform sinuses Pharyngeal Material enters airway, remains ABOVE vocal cords then ejected out Pharyngeal- Nectar Straw -- Pharyngeal -- Pharyngeal- Thin Teaspoon -- Pharyngeal -- Pharyngeal- Thin Cup Delayed swallow initiation-pyriform sinuses;Penetration/Aspiration before swallow;Trace aspiration Pharyngeal Material enters airway, passes BELOW cords and not ejected out despite cough attempt by patient Pharyngeal- Thin Straw -- Pharyngeal -- Pharyngeal- Puree Delayed swallow initiation-vallecula;Delayed swallow initiation-pyriform sinuses Pharyngeal -- Pharyngeal- Mechanical Soft -- Pharyngeal -- Pharyngeal- Regular Delayed swallow initiation-vallecula;Delayed swallow initiation-pyriform sinuses Pharyngeal -- Pharyngeal- Multi-consistency -- Pharyngeal -- Pharyngeal- Pill -- Pharyngeal -- Pharyngeal Comment --  No flowsheet data found. [name] 12/21/2020, 2:08 PM               Labs:  Basic Metabolic Panel: Recent Labs  Lab 01/08/21 0810 01/12/21 0451  NA 140 141  K 3.8 3.8  CL 103 104  CO2 28 27  GLUCOSE 84 92  BUN 27* 29*  CREATININE 0.86 0.85  CALCIUM 9.4 9.1    CBC: Recent Labs  Lab 01/08/21 0810  WBC 7.8  NEUTROABS 4.8  HGB 16.6*  HCT 51.6*  MCV 95.9  PLT 283    CBG: No results for input(s): GLUCAP in the last 168 hours.  Family history.  Mother with liver cancer and hypertension.  Father with lung cancer.  Sister with breast cancer.  Denies any colon cancer esophageal cancer or rectal cancer  Brief HPI:   MAROLYN URSCHEL is a 75 y.o. right-handed female with history of chronic diastolic congestive heart failure atrial fibrillation maintained on Eliquis Bell's palsy hypertension  OSA right femoral-popliteal bypass graft stenosis followed by vascular surgery recent hospitalization 8 /1-8/24/2022 for left MCA infarction status post thrombectomy with residual right-sided weakness as well as dysphagia and PEG tube placement 12/01/2020 per Dr.Lovick.  She was discharged to skilled nursing facility.  Per chart review prior to CVA August 2022 lives with spouse two-level home independent without DME.  Presented 12/12/2020 with labored breathing as well as low-grade fever.  Noted admission chemistries WBC 19,000 BUN 34 troponin high-sensitivity 173.  Chest x-ray noted increased right pleural effusion with worsening opacity in the right lung base with unchanged left pleural effusion.  Placed on broad-spectrum antibiotics.  Blood cultures remain negative.  She continued on chronic Eliquis.  N.p.o. with PEG tube feeds ongoing as prior to admission.  Antibiotic therapy completed.  Therapy evaluations completed due to patient decreased functional mobility with history of CVA was admitted for a comprehensive rehab program.   Hospital Course: TAYSIA RIVERE was admitted to rehab 12/21/2020 for inpatient therapies to consist of PT, ST and OT at least three hours five days a week. Past admission physiatrist, therapy team and rehab RN have worked together to provide customized collaborative inpatient rehab.  Pertaining to patient's history of left MCA/PCA infarct right hemiparesis dysphagia and aphasia complicated by aspiration pneumonia.  She was followed by neurology services.  She continued on Eliquis therapy for atrial fibrillation CVA prophylaxis no bleeding episodes.  Mood stabilization with the use of Wellbutrin.  Post stroke dysphagia gastrostomy tube 12/01/2020 and most recent swallow study per speech therapy dysphagia #2 nectar liquids.  Hyperlipidemia with Zetia ongoing as advised.  As noted  history of atrial fibrillation amiodarone 200 mg daily as well as Lopressor cardiac rate controlled.  History  of COPD simplified regimen nebulizer as advised not requiring supplemental oxygen currently.  Acute on chronic diastolic congestive heart failure exhibited no signs of fluid overload monitor closely.   Blood pressures were monitored on TID basis and soft and monitored     Rehab course: During patient's stay in rehab weekly team conferences were held to monitor patient's progress, set goals and discuss barriers to discharge. At admission, patient required moderate assist sit to stand max assist ambulate 2 feet moderate assist side-lying to sitting  Physical exam.  Blood pressure 139/100 pulse 82 temperature 98.5 respirations 18 oxygen saturation 98% room air Constitutional.  No acute distress HEENT Head.  Normocephalic and atraumatic Eyes.  Pupils round and reactive to light no discharge.nystagmus Neck.  Supple nontender no JVD without thyromegaly Cardiac regular rhythm no JVD Abdomen.  Soft nontender positive bowel sounds.  PEG tube in place Respiratory effort normal no respiratory distress without wheeze Musculoskeletal.  Normal range of motion Comments.  Right upper extremity 0/5 except trace right tricep Right lower extremity trace movement of right lower extremity with yawning but nothing seen voluntary Left upper extremity 5 -/5 in muscles with lack of compliance with exam Left lower extremity 5 -/5 Skin.  Healed stage II on backside foam dressing in place sacrum Neurologic.  Alert makes eye contact with examiner.  She is aphasic.  Follows some simple commands.  No increased tone seen on right upper right lower extremity  He/She  has had improvement in activity tolerance, balance, postural control as well as ability to compensate for deficits. He/She has had improvement in functional use RUE/LUE  and RLE/LLE as well as improvement in awareness.  Patient perform supine scoot towards edge of bed to initiate supine to sit with physical therapy providing cues for sequencing and body  mechanics to complete.  Patient able to clear both legs over the side of the bed without physical assistance.  PT cues patient to reach across body with left hand while pushing through bed with left foot to reach bed rail.  Patient able to sit 50% of upright without physical assistance but still requires moderate assist for final 50%.  Perform stand step transfer to wheelchair moderate assist.  Wheelchair transport to gym for time management.  Patient stands to lite gait with moderate assist and tech assist patient to remain standing.  ADLs stand pivot from bed wheelchair completed moderate assist.  Moderate assist for Hemi technique.  Able to wash right side of body with left hand.  Required moderate assist to don shirt using ME technique.  Needed education for putting left arm in first then right side.  Patient able to follow commands and made attempts to vocalize.  Speech therapy sessions focused on dysphagia communication goals.  SLP facilitated session by providing trials of thin liquids.  Patient consumed trials without overt aspiration.  Recommended continued thickened liquids.  SLP facilitated sessions by providing minimal visual and phonemic cues for self-monitoring.  Due to patient's limited advances family had requested skilled nursing facility placement.       Disposition: Discharged to skilled nursing facility    Diet: Dysphagia #2 nectar liquids  Free water 250 mL 6 times daily by tube  Special Instructions: Calorie counts to monitor patient's intake  Routine PEG tube flushes  Abdominal binder to guard PEG tube  Medications at discharge 1.  Tylenol 650 mg every 4 hours  as needed pain per tube 2.  Amiodarone 200 mg daily by tube 3.  Eliquis 5 mg twice daily by tube 4.  Wellbutrin 75 mg twice daily by tube 5.  Zetia 10 mg daily by tube 6.  Lopressor 50 mg twice daily by tube 7.  Protonix suspension 40 mg daily by tube 8.  Pulmicort nebulizer 0.5 mg twice daily 9.  DuoNeb  nebulizer 3 mL every 4 hours as needed shortness of breath 10.  Boost feeding supplement 1 container 3 times daily between meals  30-35 minutes were spent completing discharge summary and discharge planning  Discharge Instructions     Ambulatory referral to Neurology   Complete by: As directed    An appointment is requested in approximately: 4 weeks left MCA infarction   Ambulatory referral to Physical Medicine Rehab   Complete by: As directed    Follow-up 1 month left MCA infarction.  Patient is for skilled nursing facility placement        Contact information for follow-up providers     Ranelle Oyster, MD Follow up.   Specialty: Physical Medicine and Rehabilitation Why: Office to call for appointment Contact information: [address] [place] [address] [phone]         [name], MD Follow up.   Specialty: Cardiology Why: Call for appointment Contact information: [address] [place] [address] [phone]         Diamantina Monks, MD Follow up.   Specialty: Surgery Why: Call for appointment in regards to PEG tube care Contact information: [address] [place] [address] [phone]         Chuck Hint, MD Follow up.   Specialties: Vascular Surgery, Cardiology Why: Call for appointment in regards to follow-up evaluation 2 months Contact information: [address] [place] [address] [phone]              Contact information for after-discharge care     Destination     [facility] .   Service: Skilled Nursing Contact information: [address] [place] [address] [phone]                     Signed: Charlton Amor 01/15/2021, 5:13 AM

## 2021-01-09 NOTE — Progress Notes (Signed)
Physical Therapy Session Note  Patient Details  Name: Beverly Li MRN: [id number] Date of Birth: [birth year]  Today's Date: 01/09/2021 PT Individual Time: 1102-1200 PT Individual Time Calculation (min): 58 min   Short Term Goals: Week 3:  PT Short Term Goal 1 (Week 3): Pt will perform bed mobility with modA +1. PT Short Term Goal 2 (Week 3): Pt will perform bed to chair transfer consistently with minA. PT Short Term Goal 3 (Week 3): Pt will ambulate 61' with modA +1 and LRAD.  Skilled Therapeutic Interventions/Progress Updates:     Pt received supine in bed and agrees to therapy. No complaint of pain. Pt performs supine scoot toward edge of bed to initiate supine to sit, with PT providing cues for sequencing and body mechanics to complete. Pt is able to clear both legs over side of bed without physical assistance. PT cues pt to reach across body with L hand while pushing through bed with L foot to reach for bedrail. Pt able to sit 50% of upright without physical assistance, but still requires modA for final 50%. Pt performs stand step transfer to [facility] with modA. WC transport to gym for time management. Pt completes Litegait training over treadmill for body weight supported gait training. Pt stands to litegait with modA and tech assists pt to remain standing as PT dons harness. Pt requires modA and multimodal cueing for step up onto treadmill. PT ace wraps pt's R hand for grip support and wraps R foot for dorsiflexion and wraps R hand to facilitate grip on handlebar. Pt ambulates following bouts on treadmill with seated rest breaks:  1:51 at 0.5 mph for 84' 2:57 at 0.7 mph for 181' 3:00 at 0.8 mph for 211'  PT facilitates lateral weight shifting, assists with progression of R foot during swing phase, stability of R knee during stance phase, and providing visual and verbal cues for swing through gait pattern with L leg to facilitate reciprocal stepping and encouraging weight shift into R leg.    ModA for step down from treadmill and pt remains standing while PT doffs harness. Pt left seated in WC with alarm intact and all needs within reach.  Therapy Documentation Precautions:  Precautions Precautions: Fall, Other (comment) Precaution Comments: R hemiplegia with R shoulder subluxation, PEG, adominal binder; bladder/bowel incontinence Restrictions Weight Bearing Restrictions: No   Therapy/Group: Individual Therapy  Beau Fanny, PT, DPT 01/09/2021, 12:47 PM

## 2021-01-09 NOTE — Patient Care Conference (Signed)
Inpatient RehabilitationTeam Conference and Plan of Care Update Date: 01/09/2021   Time: 10:30 AM    Patient Name: Beverly Li      Medical Record Number: [id number]  Date of Birth: 1946/04/06 Sex: Female         Room/Bed: 4M08C/4M08C-01 Payor Info: Payor: [employer] MEDICARE / Plan: [facility] MEDICARE / Product Type: *No Product type* /    Admit Date/Time:  12/21/2020  3:41 PM  Primary Diagnosis:  Left middle cerebral artery stroke [facility])  Hospital Problems: Principal Problem:   Left middle cerebral artery stroke [facility]) Active Problems:   Acute on chronic diastolic (congestive) heart failure (HCC)   Dysphagia, post-stroke    Expected Discharge Date: Expected Discharge Date: 01/19/21  Team Members Present: Physician leading conference: Dr. Faith Rogue Social Worker Present: Cecile Sheerer, LCSWA Nurse Present: Kennyth Arnold, RN PT Present: Malachi Pro, PT OT Present: Kearney Hard, OT SLP Present: Feliberto Gottron, SLP PPS Coordinator present : Fae Pippin, SLP     Current Status/Progress Goal Weekly Team Focus  Bowel/Bladder   incontinent b&b lbm 9/26, stools soft, holding softeners and miralax  decreased incontinence  timed toileting   Swallow/Nutrition/ Hydration   Dys. 2 textures with nectar-thick liquids, Min A  Supervision  tirals of thin liquids, use of swallowing strategies   ADL's   Overall mod A of 1, still max A for LB ADLs  Min/CGA  NMES, transfers, pt/family education, R UR NMR, dc planning, sit<>stands   Mobility   modA bed mobility, modA transfers, modA +1 gait x50' with large based quad cane  minA  R hemibody NMR, ambulation, transfers, balance   Communication   Mod-Max A  Supervision-Min A  functional communication at phrase level, reading comprehension, writing   Safety/Cognition/ Behavioral Observations            Pain   denies pain  patient will remain pain free  assess for pain q 4 hrly, give prn as needed   Skin   ped tube site clean  and dry  maintain skin integrity and prevent pressure sores  assess skin q shift and reposition q 2 hrly     Discharge Planning:  Anticipate for pt to d/c to home with her husband and hired caregivers.   Team Discussion: Improvement with language and apraxia. Increased flushes, push fluids. Working on BP management. Incontinent B/B, LBM 01/08/21. No pain reported. Peg tube is clean and dry. Nursing educating on B/B continence, medications, and Peg tube care. Incontinence is a barrier. Maybe SNF placement. Mod assist with sometimes max assist. Downgrading goals to  min/mod assist. Making progress. Ambulating up to 50 ft with mod assist and wide quad cane. SLP reports she is slowly talking. Dys 2, nectar thick liquids. Working on using Provale cup to hopefully progress to thin liquids while managing how much she swallows.  Patient on target to meet rehab goals: yes, but have downgraded some goals.  *See Care Plan and progress notes for long and short-term goals.   Revisions to Treatment Plan:  MD increased flushes to aid in hydration.  Teaching Needs: Family education, medication management, skin/wound care, B/B management, transfer training, gait training, balance training, endurance training, safety awareness.  Current Barriers to Discharge: Decreased caregiver support, Medical stability, Home enviroment access/layout, Incontinence, Wound care, Lack of/limited family support, Medication compliance, and Nutritional means  Possible Resolutions to Barriers: Continue current medications, offer appropriate nutritional supplement, offer appropriate fluids, teach patient and family use of Provale cup, provide emotional support.  Medical Summary Current Status: improving verbal apraxia. intake inconsistent. bp still elevated at times.  Barriers to Discharge: Medical stability           I attest that I was present, lead the team conference, and concur with the assessment and plan of the  team.   Tennis Must 01/09/2021, 1:56 PM

## 2021-01-10 NOTE — Progress Notes (Addendum)
Patient ID: Beverly Li, female   DOB: 1945-06-13, 75 y.o.   MRN: [id number]  SW spoke with Erin/Marketing with [facility] [phone] to discuss when patient can transition. Confirms pt stay has been confirmed for 10/1 in skilled nursing. Stated SW will need to speak with [place] to discuss all pt care needs going forward.   SW left message for [facility] Manager [phone]) to follow-up on when pt can transition to SNF since effective date is 10/1. SW waiting on follow-up.  *SW returned phone call to [place] who reported will only need COVID test, FL2, and d/c summary. Agreeable to pt transition on 10/3. She intends to f/u with family to discuss and confirm if all the furniture is arranged in room. SW informed pt will require nebulizer. SW will confirm medication administration route by Monday. Pt will d/c to Rm#16 and nurse report [phone].   SW updated medical team.   Cecile Sheerer, MSW, LCSWA Office: [phone] Cell: [phone] Fax: [phone]

## 2021-01-10 NOTE — Progress Notes (Signed)
Physical Therapy Session Note  Patient Details  Name: Beverly Li MRN: [id number] Date of Birth: 02-19-1946  Today's Date: 01/10/2021 PT Individual Time: 9528-4132 and 1445-1530 PT Individual Time Calculation (min): 56 min and 45 min  Short Term Goals: Week 3:  PT Short Term Goal 1 (Week 3): Pt will perform bed mobility with modA +1. PT Short Term Goal 2 (Week 3): Pt will perform bed to chair transfer consistently with minA. PT Short Term Goal 3 (Week 3): Pt will ambulate 54' with modA +1 and LRAD.  Skilled Therapeutic Interventions/Progress Updates:     1st Session: Pt received supine in bed and agrees to therapy. No complaint of pain. PT threads pants over both legs and pt performs supine bridge with mod cueing to assist with donning pants. Supine to sit with modA/maxA and cues for sequencing and positioning. PT dons shoes while pt sits at EOB with cues for upright posture to improve balance. Stand step transfer to [facility] with modA and facilitation of R knee stability and L foot advancement. WC transport to gym for time management. Pt provided with walk-on AFO with anterior strut for R lower extremity. Sit to stand with modA and multimodal cues for body mechanics and sequencing. Pt ambulates x30' with large based quad cane and modA, with PT facilitating lateral weight shifting, anterior weight transition over L foot to facilitate progression of R foot during swing phase. Pt verbalizes fatigue in L leg following bout of ambulation, likely related to compensations and lack of adequate weight shifting to the R.   Pt performs NMR for sitting balance and weight transitions. Sitting on edge of mat, blue platform placed in between pt's legs and pt cued to reach for cup at opposite end of platform to encourage hip and trunk flexion, corresponding with anterior transition of center of gravity and anterior transition of center of pressure in feet. Cup gradually moved farther forward and farther to the R for  increased weight shifting to hemiplegic side. Pt then attempts to incorporate reaching for cup with sit to stand transfer, but has trouble with sequencing and requires modA to maxA to complete. Following, pt performs posterior scoot back onto mat with modA from PT, with cues for head hips relationship. Squat pivot back to WC with modA and cues for sequencing and hand placement. Handed off to SLP in gym.  2nd Session: Pt received supine in bed and agrees to therapy. No complaint of pain. Supine to sit with maxA and cues for sequencing and positioning at EOB. Pt performs sit to stand and stand step transfer to [facility] with modA/maxA and multimodal cues for weight shifting and foot placement. WC transport to gym for time management. Pt ambulates multiple bouts during session with modA +2 HHA primarily. Giv-mohr sling in place on R upper extremity. 95' and 80'. During second bout pt experiences x1 instance of total buckling in both legs and requires dependent +2 assist to prevent fall. During gait PT facilitates lateral weight shifting and provides mod management of R lower extremity to facilitate progression during swing phase and min stability at knee during stance phase.   Pt performs sit to stand multiple reps to practice sequencing, hand placement, and body mechanics. Pt tends to lack sufficient anterior weight transition to effectively perform sit to stand, and once standing requires modA due to posterior bias. PT provides tactile cues on distal metatarsals to provide pt with NM feedback on desired center of pressure when standing. Pt verbalizes fear of falling  forward, so PT and tech stand in front of pt to provide physical and visual barrier for pt to utilize to adequately weight shift. With increased time and cueing in standing, pt gradually improves weight distribution and only requires CGA for standing.   Pt performs stand step transfer to recliner with modA.+1 and cues for sequencing and positioning. Left  seated with all needs within reach.  Therapy Documentation Precautions:  Precautions Precautions: Fall, Other (comment) Precaution Comments: R hemiplegia with R shoulder subluxation, PEG, adominal binder; bladder/bowel incontinence Restrictions Weight Bearing Restrictions: No   Therapy/Group: Individual Therapy  Beau Fanny, PT, DPT 01/10/2021, 3:55 PM

## 2021-01-10 NOTE — Progress Notes (Signed)
Speech Language Pathology Daily Session Note  Patient Details  Name: ZUNAIRAH DEVERS MRN: [id number] Date of Birth: [birth year]  Today's Date: 01/10/2021 SLP Individual Time: 0950-1030 SLP Individual Time Calculation (min): 40 min  Short Term Goals: Week 3: SLP Short Term Goal 1 (Week 3): Patient will consume thin liquids with minimal overt s/s of aspiration with supervision verbal cues for use of small sips via cup over 2 sessions to assess readiness for upgrade. SLP Short Term Goal 2 (Week 3): Patient will consume current diet with minimal overt s/s of aspiration with Min verbal cues for use of swallowing compnesatory strategies. SLP Short Term Goal 3 (Week 3): Patient will write at the word level with 25% accuracy and Max A multimodal cues. SLP Short Term Goal 4 (Week 3): Patient will demonstrate reading comprehension at the phrase level with 75% accuracy and Min verbal cues. SLP Short Term Goal 5 (Week 3): Patient will verbalize wants/needs at the phrase level with overall Min verbal cues.  Skilled Therapeutic Interventions: Skilled treatment session focused on communication goals. SLP facilitated session by administering subtests of the [place] Aphasia Screening Test. Patient scored 10/10 for naming, 8/10 repetition, 18/20 for yes/no questions, 10/10 for object identification. Patient with difficulty with reading comprehension and auditory comprehension but suspect scores impacted by motor planning deficits. Patient left upright in wheelchair with husband present. Continue with current plan of care.      Pain Pain Assessment Pain Scale: 0-10 Pain Score: 0-No pain  Therapy/Group: Individual Therapy  Shakeira Rhee 01/10/2021, 11:17 AM

## 2021-01-10 NOTE — Progress Notes (Signed)
PROGRESS NOTE   Subjective/Complaints:  No problems overnight. Denies pain. Seems to have slept fairly well.  Review of Systems  Unable to perform ROS: Language  no change  Objective:   No results found. Recent Labs    01/08/21 0810  WBC 7.8  HGB 16.6*  HCT 51.6*  PLT 283     Recent Labs    01/08/21 0810  NA 140  K 3.8  CL 103  CO2 28  GLUCOSE 84  BUN 27*  CREATININE 0.86  CALCIUM 9.4     Intake/Output Summary (Last 24 hours) at 01/10/2021 0800 Last data filed at 01/09/2021 1838 Gross per 24 hour  Intake 280 ml  Output --  Net 280 ml          Physical Exam: Vital Signs Blood pressure 137/81, pulse 60, temperature 98 F (36.7 C), resp. rate 16, height 5\' 3"  (1.6 m), weight 45.5 kg, SpO2 94 %. Constitutional: No distress . Vital signs reviewed. HEENT: NCAT, EOMI, oral membranes moist Neck: supple Cardiovascular: RRR without murmur. No JVD    Respiratory/Chest: CTA Bilaterally without wheezes or rales. Normal effort    GI/Abdomen: BS +, non-tender, non-distended, PEG intact Ext: no clubbing, cyanosis, or edema Psych: pleasant and cooperative  Skin: intact Musc: No edema in extremities.  No tenderness in extremities. Mild sublux right shoulder Neuro: Alert Makes eye contact.   Exp>>rec word finding deficits,increasingly contextually appropriate, appropriate responses, dysarthric Motor: RUE 0/5, RLE trace to 1/5 quad, hip extension Sensation diminished to light touch right side   Assessment/Plan: 1. Functional deficits which require 3+ hours per day of interdisciplinary therapy in a comprehensive inpatient rehab setting. Physiatrist is providing close team supervision and 24 hour management of active medical problems listed below. Physiatrist and rehab team continue to assess barriers to discharge/monitor patient progress toward functional and medical goals  Care Tool:  Bathing    Body parts  bathed by patient: Face, Left upper leg, Right upper leg, Abdomen, Chest, Right arm   Body parts bathed by helper: Buttocks, Front perineal area, Left arm     Bathing assist Assist Level: Moderate Assistance - Patient 50 - 74%     Upper Body Dressing/Undressing Upper body dressing   What is the patient wearing?: Pull over shirt    Upper body assist Assist Level: Moderate Assistance - Patient 50 - 74%    Lower Body Dressing/Undressing Lower body dressing      What is the patient wearing?: Pants, Incontinence brief     Lower body assist Assist for lower body dressing: Maximal Assistance - Patient 25 - 49%     Toileting Toileting    Toileting assist Assist for toileting: Total Assistance - Patient < 25%     Transfers Chair/bed transfer  Transfers assist     Chair/bed transfer assist level: Moderate Assistance - Patient 50 - 74%     Locomotion Ambulation   Ambulation assist      Assist level: Moderate Assistance - Patient 50 - 74% Assistive device: Walker-rolling (R hand splint) Max distance: 22'   Walk 10 feet activity   Assist     Assist level: Moderate Assistance - Patient -  50 - 74% Assistive device: Walker-rolling   Walk 50 feet activity   Assist Walk 50 feet with 2 turns activity did not occur: Safety/medical concerns         Walk 150 feet activity   Assist Walk 150 feet activity did not occur: Safety/medical concerns         Walk 10 feet on uneven surface  activity   Assist Walk 10 feet on uneven surfaces activity did not occur: Safety/medical concerns         Wheelchair     Assist Is the patient using a wheelchair?: Yes Type of Wheelchair: Manual    Wheelchair assist level: Dependent - Patient 0% Max wheelchair distance: 150'    Wheelchair 50 feet with 2 turns activity    Assist        Assist Level: Dependent - Patient 0%   Wheelchair 150 feet activity     Assist      Assist Level: Dependent -  Patient 0%   Blood pressure 137/81, pulse 60, temperature 98 F (36.7 C), resp. rate 16, height 5\' 3"  (1.6 m), weight 45.5 kg, SpO2 94 %. Medical Problem List and Plan: 1.  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  Antithrombotics: -DVT/anticoagulation: Eliquis [employer]: Other (comment)             -antiplatelet therapy: N/A 3. Pain Management: Tylenol as needed 4. Mood: Wellbutrin 75 mg twice daily             -antipsychotic agents: N/A 5. Neuropsych: This patient is not capable of making decisions on her own behalf. 6. Skin/Wound Care: Routine skin checks 7. Fluids/Electrolytes/Nutrition: encourage PO  -protein for low albumin  9/28 intake improved yesterday. Continue current H20 flushes 8.  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 12/03/2020-   -MBS demonstrated improvement in oropharyngeal function with apraxia  -D 2  diet 9.  Atrial fibrillation.  Eliquis.  Amiodarone 200 mg daily, Lopressor 12.5 mg twice daily.  Cardiac rate controlled at present 10.  Hyperlipidemia.  Zetia 11.  COPD. Stable -simplified regimen to  Pulmicort and prn duoneb, doing well with this       12.  Acute on chronic diastolic congestive heart failure.     Filed Weights   01/08/21 0500 01/09/21 0500 01/10/21 0500  Weight: 45.1 kg 45.1 kg 45.5 kg    Weights stable 13. HTN:     BP has been trending back up on lopressor 25mg  bid   9/24-increased to 37.5mg  bid 9/23   -prn hydralazine   9/28-BP improved lopressor 50mg  bid  -having intermitent elevation but question whether this is technique related.   14. Constipation   - stools soft--holding miralax, also holding senna-s at hs  LOS: 20 days A FACE TO FACE EVALUATION WAS PERFORMED  01/10/2021, 8:00 AM

## 2021-01-10 NOTE — Progress Notes (Signed)
Occupational Therapy Session Note  Patient Details  Name: Beverly Li MRN: [id number] Date of Birth: Jul 11, 1945  Today's Date: 01/10/2021 OT Individual Time: 1123-1210 OT Individual Time Calculation (min): 47 min    Short Term Goals: Week 1:  OT Short Term Goal 1 (Week 1): Patient will maintain sitting balance with mod A during functional BADL task OT Short Term Goal 1 - Progress (Week 1): Met OT Short Term Goal 2 (Week 1): Patient will perofrm sit<>stand at the sink with mod A in preparation for BADL task OT Short Term Goal 2 - Progress (Week 1): Progressing toward goal OT Short Term Goal 3 (Week 1): Patient will complete 1 step of UB dressing task with mod questioning cues. OT Short Term Goal 3 - Progress (Week 1): Met Week 2:  OT Short Term Goal 1 (Week 2): Patient will perofrm sit<>stand at the sink with mod A in preparation for BADL task OT Short Term Goal 1 - Progress (Week 2): Met OT Short Term Goal 2 (Week 2): Patient will perform self ROM of R UE with mod intructional cues OT Short Term Goal 2 - Progress (Week 2): Met OT Short Term Goal 3 (Week 2): Patient will tolerate standing for 2 minutes in preparation for BADL task OT Short Term Goal 3 - Progress (Week 2): Met Week 3:  OT Short Term Goal 1 (Week 3): Pt will perform mod A transfer to [facility] OT Short Term Goal 2 (Week 3): Pt will use R UE as s stabilizer with mod A OT Short Term Goal 3 (Week 3): Pt will perform UB dressing with mod A  Skilled Therapeutic Interventions/Progress Updates:  Pt received upright in wc, no c/o pain, agreeable to OT, and spouse present. Pt made multiple attempts to speak in full sentences during tx, and at one point stated "I think I've talked too much today". Extra time was allowed to offer pt the opportunity to communicate and provide her input to tx.   ADL: Skilled interventions include: Pt agreed to change shirt at sink and doffed shirt with MOD A for hemi technique. Able to wash R side of UB  with L UE, but required OTS facilitated hand-over-hand total A to integrate R hand for neuro re-ed. R hand placed on sink for weight-bearing during task. Dressing completed from wc at sink with hemi dressing techniques MIN A. Step-by-step multimodal cuing provided for application of deodorant. Needed increased time for processing and executing of cues d/t poor awareness of R side. Pt stated "No" when asked if she wanted to brush her hair. Spouse requested she rest in bed prior to next therapy. Pt completed squat > pivot transfer wc > EOB MOD A, needing directional cuing for scooting/leaning forward. MOD A for EOB > supine with LOB to R side in sitting. L UE supported by pillow for e-stim placement.    OTS placed SAEBO e-stim on extensors for subluxation. SAEBO left on for 60 minutes. OT returned to remove SAEBO with skin intact and no adverse reactions.   Sa[place]o Stim One 330 pulse width 35 Hz pulse rate On 8 sec/ off 8 sec Ramp up/ down 2 sec Symmetrical Biphasic wave form  Max intensity 138m at 500 Ohm load  Pt left at end of session in bed, Bill present, with exit alarm on, call light in reach and all needs met.      Therapy Documentation Precautions:  Precautions Precautions: Fall, Other (comment) Precaution Comments: R hemiplegia with R shoulder subluxation, PEG,  adominal binder; bladder/bowel incontinence Restrictions Weight Bearing Restrictions: No   Therapy/Group: Individual Therapy  Beverly Li 01/10/2021, 12:22 PM

## 2021-01-11 NOTE — Progress Notes (Signed)
Occupational Therapy Progress Note  Patient Details  Name: Beverly Li MRN: [id number] Date of Birth: 29-Dec-1945  Today's Date: 01/11/2021 Session 1 OT Individual Time: 2119-4174 OT Individual Time Calculation (min): 58 min   Session 2 OT Individual Time: 1130-1200 OT Individual Time Calculation (min): 30 min    Skilled Therapeutic Interventions/Progress Updates:  Session 1   Pt greeted semi-reclined in bed and agreeable to OT treatment session. Pt's breakfast had just arrived and pt agreeable to get up to wc to eat. Addressed LB dressing at EOB prior to transfer and noted pt's brief was wet. Worked on sit<>stands and standing balance while OT provided max A for peri-care and brief change. OT assist to get LLE into figure 4 position and start trheading pant leg, then pt able to asssitist. Pt was also able to thread LLE into pants without cues today. Mod A sit<>stand and pt able to assist with pulling pants up over hips. Mod A stand-pivot to wc. Breakfast consumed with pt able to manage feeding utensil appropriately. She needed cues for slow rate of intake with  coughing spell after eating eggs. Pt needed cues to drink nectar thick juice during breakfast as well. Pt then brought to the sink for UB bathing/dressing. Worked on visual scanning to locate items at the sink. Hand over hand A to integrate R UE  for neuro re-ed, but R UE remains flaccid with sublux. Mod A for UB ADLs overall. Pt completed stand-pivot to recliner for comfort sitting up and mod A. Pt left with spouse present and needs met.   OT placed SAEBO e-stim. SAEBO left on for 60 minutes. OT returned to remove SAEBO with skin intact and no adverse reactions.  Saebo Stim One 330 pulse width 35 Hz pulse rate On 8 sec/ off 8 sec Ramp up/ down 2 sec Symmetrical Biphasic wave form  Max intensity 177m at 500 Ohm load  Pain:  Denies pain  Session 2 Pt greeted semi-reclined in bed with spouse reporting that patient is wet. Pt  incontinent of urine that soaked through clothing. Pt completed bed mobility with max A to advance R side. Stand-pivot transfer bed>wc with mod A. Worked on sit<>stand and standing balance during brief change. Mod  Asit<>stand and mod A for balance while pt removed L UE from sink to assist with washing peri-area and buttocks. Weight bearing through R UE while standing for neuro re-ed. While standing, performed  RUE NMR with paper towel pushes on sink. Pt with some shoulder and scapula activation today and was able to slightly push forward! Pt completed 10 paper towel pushes. Patient returned to wc and left seated in wc with spouse present and needs met.    Therapy Documentation Precautions:  Precautions Precautions: Fall, Other (comment) Precaution Comments: R hemiplegia with R shoulder subluxation, PEG, adominal binder; bladder/bowel incontinence Restrictions Weight Bearing Restrictions: No Pain:  Denies pain   Therapy/Group: Individual Therapy  EValma Cava9/29/2022, 12:42 PM

## 2021-01-11 NOTE — [place] FL2 (Signed)
[place] MEDICAID FL2 LEVEL OF CARE SCREENING TOOL     IDENTIFICATION  Patient Name: Beverly Li Birthdate: [birth year] Sex: female Admission Date (Current Location): 12/21/2020  [facility] and [place] Number:  Producer, television/film/video and Address:  [place]. [facility], 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   Sepsis due to pneumonia (HCC) 12/12/2020   Dysphagia 12/12/2020   GERD (gastroesophageal reflux disease) 12/12/2020   Hypoxia    Respiratory distress    Respiration abnormal    Pressure injury of skin 11/23/2020   Protein-calorie malnutrition, severe 11/16/2020   Stroke (HCC) 11/13/2020   Middle cerebral artery embolism, left 11/13/2020   Encounter for feeding tube placement 04/27/2020   Sinus bradycardia 12/25/2019   COPD (chronic obstructive pulmonary disease) (HCC) 12/16/2018   Cough 12/16/2018   Unintentional weight loss 10/05/2018   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   Visit for monitoring Tikosyn therapy 11/18/2017   Closed nondisplaced fracture of head of radius with routine healing 03/03/2017   Coronary artery disease involving native coronary artery of native heart without angina pectoris    SOB (shortness of breath) 01/26/2016   Acute on chronic diastolic CHF (congestive heart failure) (HCC) 03/28/2015   Paroxysmal atrial fibrillation (HCC)    Essential hypertension    S/P femoral-popliteal  bypass surgery    Obstructive hypertrophic cardiomyopathy (HCC) 03/03/2015   PAD (peripheral artery disease) (HCC) 03/02/2015   Acute respiratory failure with hypoxia (HCC) 03/02/2015   Claudication (HCC) 01/31/2015   Former smoker 01/31/2015   Hyperlipidemia 01/31/2015   Atherosclerosis of native arteries of extremity with intermittent claudication (HCC) 02/10/2014   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   Atrial arrhythmia    Mild aortic sclerosis     Orientation RESPIRATION BLADDER Height & Weight     Self, Situation, Place, Time  Normal Incontinent Weight: 101 lb 13.6 oz (46.2 kg) Height:  5\' 3"  (160 cm)  BEHAVIORAL SYMPTOMS/MOOD NEUROLOGICAL BOWEL NUTRITION STATUS      Incontinent Diet, Feeding tube (D2 nectar thick; only flushing PEG tube and medications through tube)  AMBULATORY STATUS COMMUNICATION OF NEEDS Skin   Limited Assist Non-Verbally (pt is aphasic) Other (Comment) (Foam dressing to bottom and barrier cream to abdominal folds.)                       Personal Care Assistance Level of Assistance  Bathing, Feeding, Dressing Bathing Assistance: Limited assistance Feeding assistance: Limited assistance Dressing Assistance: Limited assistance     Functional Limitations Info  Speech     Speech Info: Impaired    SPECIAL CARE FACTORS FREQUENCY  PT (By licensed PT), OT (By licensed OT), Speech therapy     PT Frequency: 5xs per week OT Frequency: 5xs per week     Speech Therapy Frequency: 5xs per week      Contractures Contractures Info: Not present    Additional Factors  Info  Code Status, Allergies Code Status Info: Full Allergies Info: Amoxicillin;Atenolol- Cough; Crestor (Rosuvastatin Calcium)- muscle aches; Pravastatin- Muscle aches; Sulfa Antibiotics-Nausea; Codeine-hallucinations Psychotropic Info: buPROPion [facility]) tablet 75 mg  2 times daily Route: PER TUBE Insulin Sliding Scale Info: N/A       Current Medications  (01/11/2021):  This is the current hospital active medication list Current Facility-Administered Medications  Medication Dose Route Frequency Provider Last Rate Last Admin   (feeding supplement) PROSource Plus liquid 30 mL  30 mL Oral TID BM Ranelle Oyster, MD   30 mL at 01/11/21 1404   acetaminophen (TYLENOL) 160 MG/5ML solution 650 mg  650 mg Per Tube Q4H PRN Charlton Amor, PA-C   650 mg at 01/07/21 2043   amiodarone (PACERONE) tablet 200 mg  200 mg Per Tube Daily Charlton Amor, PA-C   200 mg at 01/11/21 9629   apixaban (ELIQUIS) tablet 5 mg  5 mg Per Tube BID Charlton Amor, PA-C   5 mg at 01/11/21 5284   budesonide (PULMICORT) nebulizer solution 0.5 mg  0.5 mg Nebulization BID Charlton Amor, PA-C   0.5 mg at 01/10/21 2100   buPROPion [facility]) tablet 75 mg  75 mg Per Tube BID Charlton Amor, PA-C   75 mg at 01/11/21 1635   ezetimibe (ZETIA) tablet 10 mg  10 mg Per Tube Daily Charlton Amor, PA-C   10 mg at 01/11/21 1324   feeding supplement (BOOST / RESOURCE BREEZE) liquid 1 Container  1 Container Oral TID BM Ranelle Oyster, MD   1 Container at 01/10/21 2038   free water 350 mL  350 mL Per Tube QID Faith Rogue T, MD   350 mL at 01/11/21 1636   hydrALAZINE (APRESOLINE) tablet 10 mg  10 mg Per Tube Q4H PRN Marcello Fennel, MD   10 mg at 01/09/21 1720   ipratropium-albuterol (DUONEB) 0.5-2.5 (3) MG/3ML nebulizer solution 3 mL  3 mL Nebulization Q4H PRN Charlton Amor, PA-C       MEDLINE mouth rinse  15 mL Mouth Rinse BID Faith Rogue T, MD   15 mL at 01/11/21 0932   metoprolol tartrate (LOPRESSOR) tablet 50 mg  50 mg Per Tube BID Faith Rogue T, MD   50 mg at 01/11/21 4010   pantoprazole sodium (PROTONIX) 40 mg/20 mL oral suspension 40 mg  40 mg Per Tube QHS Ranelle Oyster, MD   40 mg at 01/10/21 2031     Discharge Medications: Please see discharge summary for a list of discharge medications.  Relevant Imaging Results:  Relevant Lab  Results:   Additional Information UV#[id number]  Gretchen Short, LCSW

## 2021-01-11 NOTE — Progress Notes (Signed)
PROGRESS NOTE   Subjective/Complaints:  No new issues. Seems to have slept  Review of Systems  Unable to perform ROS: Language  no change  Objective:   No results found. No results for input(s): WBC, HGB, HCT, PLT in the last 72 hours.   No results for input(s): NA, K, CL, CO2, GLUCOSE, BUN, CREATININE, CALCIUM in the last 72 hours.   Intake/Output Summary (Last 24 hours) at 01/11/2021 0918 Last data filed at 01/10/2021 1819 Gross per 24 hour  Intake 360 ml  Output --  Net 360 ml          Physical Exam: Vital Signs Blood pressure (!) 161/95, pulse 87, temperature 97.6 F (36.4 C), temperature source Oral, resp. rate 18, height 5\' 3"  (1.6 m), weight 46.2 kg, SpO2 98 %. Constitutional: No distress . Vital signs reviewed. HEENT: NCAT, EOMI, oral membranes moist Neck: supple Cardiovascular: RRR without murmur. No JVD    Respiratory/Chest: CTA Bilaterally without wheezes or rales. Normal effort    GI/Abdomen: BS +, non-tender, non-distended, PEG clean Ext: no clubbing, cyanosis, or edema Psych: pleasant and cooperative  Skin: intact, NSL LUE Musc: No edema in extremities.  No tenderness in extremities. Mild sublux right shoulder Neuro: Alert and engaging Exp>>rec word finding deficits,increasingly contextually appropriate, appropriate responses, dysarthric Motor: RUE 0/5, RLE trace to 1/5 quad, hip extension. No resting tone Sensation remains diminished to light touch right side   Assessment/Plan: 1. Functional deficits which require 3+ hours per day of interdisciplinary therapy in a comprehensive inpatient rehab setting. Physiatrist is providing close team supervision and 24 hour management of active medical problems listed below. Physiatrist and rehab team continue to assess barriers to discharge/monitor patient progress toward functional and medical goals  Care Tool:  Bathing    Body parts bathed by  patient: Face, Left upper leg, Right upper leg, Abdomen, Chest, Right arm   Body parts bathed by helper: Buttocks, Front perineal area, Left arm     Bathing assist Assist Level: Moderate Assistance - Patient 50 - 74%     Upper Body Dressing/Undressing Upper body dressing   What is the patient wearing?: Pull over shirt    Upper body assist Assist Level: Moderate Assistance - Patient 50 - 74%    Lower Body Dressing/Undressing Lower body dressing      What is the patient wearing?: Pants, Incontinence brief     Lower body assist Assist for lower body dressing: Maximal Assistance - Patient 25 - 49%     Toileting Toileting    Toileting assist Assist for toileting: Total Assistance - Patient < 25%     Transfers Chair/bed transfer  Transfers assist     Chair/bed transfer assist level: Moderate Assistance - Patient 50 - 74%     Locomotion Ambulation   Ambulation assist      Assist level: Moderate Assistance - Patient 50 - 74% Assistive device: Walker-rolling (R hand splint) Max distance: 22'   Walk 10 feet activity   Assist     Assist level: Moderate Assistance - Patient - 50 - 74% Assistive device: Walker-rolling   Walk 50 feet activity   Assist Walk 50 feet with 2 turns  activity did not occur: Safety/medical concerns         Walk 150 feet activity   Assist Walk 150 feet activity did not occur: Safety/medical concerns         Walk 10 feet on uneven surface  activity   Assist Walk 10 feet on uneven surfaces activity did not occur: Safety/medical concerns         Wheelchair     Assist Is the patient using a wheelchair?: Yes Type of Wheelchair: Manual    Wheelchair assist level: Dependent - Patient 0% Max wheelchair distance: 150'    Wheelchair 50 feet with 2 turns activity    Assist        Assist Level: Dependent - Patient 0%   Wheelchair 150 feet activity     Assist      Assist Level: Dependent - Patient  0%   Blood pressure (!) 161/95, pulse 87, temperature 97.6 F (36.4 C), temperature source Oral, resp. rate 18, height 5\' 3"  (1.6 m), weight 46.2 kg, SpO2 98 %. Medical Problem List and Plan: 1.  Aphasia, dysphagia,  and right hemiparesis secondary to recent left MCA, PCA stroke, complicated by aspiration pneumonia  -Continue CIR therapies including PT, OT, and SLP ---SNF 10/3  PRAFO/WHO qhs, Sling RUE ok for when OOB 2.  Antithrombotics: -DVT/anticoagulation: Eliquis [employer]: Other (comment)             -antiplatelet therapy: N/A 3. Pain Management: Tylenol as needed 4. Mood: Wellbutrin 75 mg twice daily             -antipsychotic agents: N/A 5. Neuropsych: This patient is not capable of making decisions on her own behalf. 6. Skin/Wound Care: Routine skin checks 7. Fluids/Electrolytes/Nutrition:   -protein for low albumin  9/29 intake improving, encouraging fluids, extra H2O thru PEG 8.  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 12/03/2020-   -MBS demonstrated improvement in oropharyngeal function with apraxia  -D 2  diet 9.  Atrial fibrillation.  Eliquis.  Amiodarone 200 mg daily, Lopressor 12.5 mg twice daily.  Cardiac rate controlled at present 10.  Hyperlipidemia.  Zetia 11.  COPD. Stable -simplified regimen to  Pulmicort and prn duoneb, doing well with this       12.  Acute on chronic diastolic congestive heart failure.     Filed Weights   01/09/21 0500 01/10/21 0500 01/11/21 0500  Weight: 45.1 kg 45.5 kg 46.2 kg    Weights with increase d/t better PO intake 13. HTN:     9/29 BP bordereline at times but improved with lopressor 50mg  bid  -may be some recording technique issues.   14. Constipation   - stools soft--holding miralax, also holding senna-s at hs  LOS: 21 days A FACE TO FACE EVALUATION WAS PERFORMED  10/29 01/11/2021, 9:18 AM

## 2021-01-11 NOTE — Progress Notes (Signed)
Occupational Therapy Session Note  Patient Details  Name: Beverly Li MRN: [id number] Date of Birth: January 15, 1946  Today's Date: 01/11/2021 OT Individual Time: [id number] OT Individual Time Calculation (min): 17 mins( make up time for missed minutes)   Short Term Goals: Week 3:  OT Short Term Goal 1 (Week 3): Pt will perform mod A transfer to [facility] OT Short Term Goal 2 (Week 3): Pt will use R UE as s stabilizer with mod A OT Short Term Goal 3 (Week 3): Pt will perform UB dressing with mod A  Skilled Therapeutic Interventions/Progress Updates:  Pt greeted seated in recliner with pts husband present. Pt seen for make up time, pt with Saebo E-stim donned therefore session focus on family education. Pts husband with questions about exercises that can be done with RUE; provided education on light PROM exercises that can be completed as well sensory re-education techniques such as applying lotion to pts RUE. Discussed importance of positioning for RUE with pts husband reporting understanding. Pt left seated in recliner with all needs within reach and husband present. Stepped out of room briefly with pts husband asking for assist to tranfser pt back to bed; pt required MOD A for stand pivot tranfser from Recliner>EOB. Total A to return to supine.  Pt left supine in bed with all needs within reach.   Therapy Documentation Precautions:  Precautions Precautions: Fall, Other (comment) Precaution Comments: R hemiplegia with R shoulder subluxation, PEG, adominal binder; bladder/bowel incontinence Restrictions Weight Bearing Restrictions: No   Pain: no s/s of pain during session    Therapy/Group: Individual Therapy  Barron Schmid 01/11/2021, 12:40 PM

## 2021-01-11 NOTE — Progress Notes (Signed)
Physical Therapy Session Note  Patient Details  Name: Beverly Li MRN: [id number] Date of Birth: Jun 02, 1945  Today's Date: 01/11/2021 PT Individual Time: 1305-1400 PT Individual Time Calculation (min): 55 min   Short Term Goals:  Week 3:  PT Short Term Goal 1 (Week 3): Pt will perform bed mobility with modA +1. PT Short Term Goal 2 (Week 3): Pt will perform bed to chair transfer consistently with minA. PT Short Term Goal 3 (Week 3): Pt will ambulate 52' with modA +1 and LRAD.  Skilled Therapeutic Interventions/Progress Updates:   Pt received sitting in WC and agreeable to PT  Squat pivot transfer to mat table with min assist. Sit<>supine with mod assist intermittently for RUE/RLE  control.   Supine NMR bridge x 10, lumbar/pelvic rotation x 12 Bil, SAQ with AAROM, hip/knee flexion/extension in syndergy x 12 BLE with trace activation on the RLE.   Orthotist then present. Gait training with HW and ridged support anterior AFO with min assist to only advance the RLE. Performed x 84f and 177f No knee instability noted posterior/anterior with AFO in place.   WC mobility with hemi technique x 150105fith min assist to prevent veer to the R and cues for improved use of LE to improve steering.   Patient returned to room  in WC.[facility] on DME needs for HW, AFO and givmohr. Pt left sitting in WC with call bell in reach and all needs met.        Therapy Documentation Precautions:  Precautions Precautions: Fall, Other (comment) Precaution Comments: R hemiplegia with R shoulder subluxation, PEG, adominal binder; bladder/bowel incontinence Restrictions Weight Bearing Restrictions: No  Pain: Faces: none.     Therapy/Group: Individual Therapy  AusLorie Phenix29/2022, 5:52 PM

## 2021-01-11 NOTE — Progress Notes (Signed)
Patient ID: Beverly Li, female   DOB: 12/08/1945, 75 y.o.   MRN: [id number]  SW met with pt husband in room to review discharge plan for transition to SNF-[facility]. Confirms that he spoke with [place] in admissions yesterday. SW informed transition will occur on Monday with [facility] ambulance to be scheduled for 10am p/u on 10/3.  Confirms room is prepared. Questioned medications wife will be sent home with. SW informed not aware of any medications but will check with medical team. Also informed typically medications are filled by SNF.   SW scheduled ambulance p/u for 10am on 10/3 to [facility].   SW returned phone call to pt son Beverly Li who wanted to confirm discharge. SW reviewed with him. No questions/concerns reported, and encouraged to follow-up if needed.   Loralee Pacas, MSW, [facility] Office: [phone] Cell: [phone] Fax: [phone]

## 2021-01-11 NOTE — Progress Notes (Signed)
Speech Language Pathology Daily Session Note  Patient Details  Name: Beverly Li MRN: [id number] Date of Birth: [birth year]  Today's Date: 01/11/2021 SLP Individual Time: 1400-1500 SLP Individual Time Calculation (min): 60 min  Short Term Goals: Week 3: SLP Short Term Goal 1 (Week 3): Patient will consume thin liquids with minimal overt s/s of aspiration with supervision verbal cues for use of small sips via cup over 2 sessions to assess readiness for upgrade. SLP Short Term Goal 2 (Week 3): Patient will consume current diet with minimal overt s/s of aspiration with Min verbal cues for use of swallowing compnesatory strategies. SLP Short Term Goal 3 (Week 3): Patient will write at the word level with 25% accuracy and Max A multimodal cues. SLP Short Term Goal 4 (Week 3): Patient will demonstrate reading comprehension at the phrase level with 75% accuracy and Min verbal cues. SLP Short Term Goal 5 (Week 3): Patient will verbalize wants/needs at the phrase level with overall Min verbal cues.  Skilled Therapeutic Interventions: Skilled treatment session focused on dysphagia and cognitive goals. SLP facilitated session by providing trials of thin while utilizing the 10cc Provale cup. Patient demonstrated what appeared to be a delay in swallow trigger resulting in overt s/s of aspiration. Recommend patient continue nectar-thick liquids with all meals and staff. SLP also facilitated session by completing the subtests of the [place] Aphasia Screening Test. Patient attempted to write at the word level but got 0% accurate, suspect function impacted by apraxia and having to utilize her non-dominant hand throughout task. Patient also completed a verbal fluency task in which she had to describe actions within a picture. Patient was able to produce 3 spontaneous and appropriate utterances. Patient left upright in wheelchair with alarm on and husband present. Continue with current plan of care.       Pain No/Denies Pain   Therapy/Group: Individual Therapy  Beverly Li 01/11/2021, 3:31 PM

## 2021-01-11 NOTE — Plan of Care (Signed)
  Problem: RH Balance Goal: LTG Patient will maintain dynamic standing with ADLs (OT) Description: LTG:  Patient will maintain dynamic standing balance with assist during activities of daily living (OT)  Flowsheets (Taken 01/11/2021 1247) LTG: Pt will maintain dynamic standing balance during ADLs with: Moderate Assistance - Patient 50 - 74% Note: Goal downgraded 9/29 due to patient discharging to SNF earlier - ESD   Problem: Sit to Stand Goal: LTG:  Patient will perform sit to stand in prep for activites of daily living with assistance level (OT) Description: LTG:  Patient will perform sit to stand in prep for activites of daily living with assistance level (OT) Flowsheets (Taken 01/11/2021 1247) LTG: PT will perform sit to stand in prep for activites of daily living with assistance level: Moderate Assistance - Patient 50 - 74% Note: Goal downgraded 9/29 due to patient discharging to SNF earlier - ESD   Problem: RH Bathing Goal: LTG Patient will bathe all body parts with assist levels (OT) Description: LTG: Patient will bathe all body parts with assist levels (OT) Flowsheets (Taken 01/11/2021 1247) LTG: Pt will perform bathing with assistance level/cueing: Moderate Assistance - Patient 50 - 74% Note: Goal downgraded 9/29 due to patient discharging to SNF earlier - ESD   Problem: RH Dressing Goal: LTG Patient will perform upper body dressing (OT) Description: LTG Patient will perform upper body dressing with assist, with/without cues (OT). Flowsheets (Taken 01/11/2021 1247) LTG: Pt will perform upper body dressing with assistance level of: Moderate Assistance - Patient 50 - 74% Note: Goal downgraded 9/29 due to patient discharging to SNF earlier - ESD Goal: LTG Patient will perform lower body dressing w/assist (OT) Description: LTG: Patient will perform lower body dressing with assist, with/without cues in positioning using equipment (OT) Outcome: Not Applicable Flowsheets (Taken 01/11/2021  1247) LTG: Pt will perform lower body dressing with assistance level of: (d/c goal) -- Note: D/c goal 9/29 due to patient discharging to SNF earlier - ESD   Problem: RH Toileting Goal: LTG Patient will perform toileting task (3/3 steps) with assistance level (OT) Description: LTG: Patient will perform toileting task (3/3 steps) with assistance level (OT)  Outcome: Not Applicable Flowsheets (Taken 01/11/2021 1247) LTG: Pt will perform toileting task (3/3 steps) with assistance level: (d/c goal 9/29) -- Note: D/c goal 9/29 due to patient discharging to SNF earlier - ESD   Problem: RH Functional Use of Upper Extremity Goal: LTG Patient will use RT/LT upper extremity as a (OT) Description: LTG: Patient will use right/left upper extremity as a stabilizer/gross assist/diminished/nondominant/dominant level with assist, with/without cues during functional activity (OT) Flowsheets (Taken 01/11/2021 1247) LTG: Use of upper extremity in functional activities: LUE as a stabilizer LTG: Pt will use upper extremity in functional activity with assistance level of: Moderate Assistance - Patient 50 - 74%   Problem: RH Toilet Transfers Goal: LTG Patient will perform toilet transfers w/assist (OT) Description: LTG: Patient will perform toilet transfers with assist, with/without cues using equipment (OT) Flowsheets (Taken 01/11/2021 1247) LTG: Pt will perform toilet transfers with assistance level of: Moderate Assistance - Patient 50 - 74% Note: Goal downgraded 9/29 due to patient discharging to SNF earlier - ESD   Problem: RH Tub/Shower Transfers Goal: LTG Patient will perform tub/shower transfers w/assist (OT) Description: LTG: Patient will perform tub/shower transfers with assist, with/without cues using equipment (OT) Outcome: Not Applicable Note: D/c goal 9/29 due to patient discharging to SNF earlier - ESD

## 2021-01-12 ENCOUNTER — Ambulatory Visit: Payer: Medicare Other | Admitting: Cardiology

## 2021-01-12 DIAGNOSIS — R799 Abnormal finding of blood chemistry, unspecified: Secondary | ICD-10-CM

## 2021-01-12 LAB — BASIC METABOLIC PANEL
Anion gap: 10 (ref 5–15)
BUN: 29 mg/dL — ABNORMAL HIGH (ref 8–23)
CO2: 27 mmol/L (ref 22–32)
Calcium: 9.1 mg/dL (ref 8.9–10.3)
Chloride: 104 mmol/L (ref 98–111)
Creatinine, Ser: 0.85 mg/dL (ref 0.44–1.00)
GFR, Estimated: >60 mL/min (ref >60)
Glucose, Bld: 92 mg/dL (ref 70–99)
Potassium: 3.8 mmol/L (ref 3.5–5.1)
Sodium: 141 mmol/L (ref 135–145)

## 2021-01-12 MED ORDER — PANTOPRAZOLE SODIUM 40 MG PO PACK: 40.0000 mg | PACK | Freq: Every day | ORAL | Status: DC

## 2021-01-12 MED ORDER — FREE WATER: 350.0000 mL | Freq: Four times a day (QID) | Status: DC

## 2021-01-12 MED ORDER — METOPROLOL TARTRATE 50 MG PO TABS: 50.0000 mg | ORAL_TABLET | Freq: Two times a day (BID) | ORAL | Status: DC

## 2021-01-12 MED ORDER — FREE WATER
250.0000 mL | Freq: Every day | Status: DC
  Administered 2021-01-12 – 2021-01-15 (×18): 250 mL

## 2021-01-12 MED ORDER — IPRATROPIUM-ALBUTEROL 0.5-2.5 (3) MG/3ML IN SOLN: 3.0000 mL | RESPIRATORY_TRACT | Status: DC | PRN

## 2021-01-12 NOTE — Progress Notes (Signed)
Orthopedic Tech Progress Note Patient Details:  Beverly Li 1945-08-08 [id number]  Patient ID: Beverly Li, female   DOB: 16-Sep-1945, 75 y.o.   MRN: [id number] Called [facility] with R foot AFO order.  Also to check status of [employer] for R UE.   Thanks,  Corinna Capra, PT, DPT  Acute Rehabilitation Ortho Tech Supervisor [phone] pager #[phone] office    Beverly Li 01/12/2021, 6:50 AM

## 2021-01-12 NOTE — Progress Notes (Signed)
PROGRESS NOTE   Subjective/Complaints: Patient seen sitting up at edge of the bed this morning.  He indicates he slept well overnight.  He appears to be aware of plans for discharge.  Review of Systems  Unable to perform ROS: Language  persistent  Objective:   No results found. No results for input(s): WBC, HGB, HCT, PLT in the last 72 hours.   Recent Labs    01/12/21 0451  NA 141  K 3.8  CL 104  CO2 27  GLUCOSE 92  BUN 29*  CREATININE 0.85  CALCIUM 9.1     Intake/Output Summary (Last 24 hours) at 01/12/2021 0921 Last data filed at 01/12/2021 0830 Gross per 24 hour  Intake 420 ml  Output --  Net 420 ml          Physical Exam: Vital Signs Blood pressure (!) 172/82, pulse 82, temperature 97.7 F (36.5 C), temperature source Oral, resp. rate 20, height 5\' 3"  (1.6 m), weight 43.7 kg, SpO2 93 %. Constitutional: No distress . Vital signs reviewed. HENT: Normocephalic.  Atraumatic. Eyes: EOMI. No discharge. Cardiovascular: No JVD.  RRR. Respiratory: Normal effort.  No stridor.  Bilateral clear to auscultation. GI: Non-distended.  BS +. Skin: Warm and dry.  Intact. Psych: Normal mood.  Normal behavior. Musc: No edema in extremities.  No tenderness in extremities. Neuro: Alert Exp>>rec, relatively stable Dysarthria Motor: RUE 0/5, RLE 0/5 No increase in tone appreciated  Assessment/Plan: 1. Functional deficits which require 3+ hours per day of interdisciplinary therapy in a comprehensive inpatient rehab setting. Physiatrist is providing close team supervision and 24 hour management of active medical problems listed below. Physiatrist and rehab team continue to assess barriers to discharge/monitor patient progress toward functional and medical goals  Care Tool:  Bathing    Body parts bathed by patient: Face, Left upper leg, Right upper leg, Abdomen, Chest, Right arm   Body parts bathed by helper:  Buttocks, Front perineal area, Left arm     Bathing assist Assist Level: Moderate Assistance - Patient 50 - 74%     Upper Body Dressing/Undressing Upper body dressing   What is the patient wearing?: Pull over shirt    Upper body assist Assist Level: Moderate Assistance - Patient 50 - 74%    Lower Body Dressing/Undressing Lower body dressing      What is the patient wearing?: Pants, Incontinence brief     Lower body assist Assist for lower body dressing: Maximal Assistance - Patient 25 - 49%     Toileting Toileting    Toileting assist Assist for toileting: Total Assistance - Patient < 25%     Transfers Chair/bed transfer  Transfers assist     Chair/bed transfer assist level: Moderate Assistance - Patient 50 - 74%     Locomotion Ambulation   Ambulation assist      Assist level: Moderate Assistance - Patient 50 - 74% Assistive device: Walker-rolling (R hand splint) Max distance: 22'   Walk 10 feet activity   Assist     Assist level: Moderate Assistance - Patient - 50 - 74% Assistive device: Walker-rolling   Walk 50 feet activity   Assist Walk 50 feet with  2 turns activity did not occur: Safety/medical concerns         Walk 150 feet activity   Assist Walk 150 feet activity did not occur: Safety/medical concerns         Walk 10 feet on uneven surface  activity   Assist Walk 10 feet on uneven surfaces activity did not occur: Safety/medical concerns         Wheelchair     Assist Is the patient using a wheelchair?: Yes Type of Wheelchair: Manual    Wheelchair assist level: Dependent - Patient 0% Max wheelchair distance: 150'    Wheelchair 50 feet with 2 turns activity    Assist        Assist Level: Dependent - Patient 0%   Wheelchair 150 feet activity     Assist      Assist Level: Dependent - Patient 0%   Blood pressure (!) 172/82, pulse 82, temperature 97.7 F (36.5 C), temperature source Oral, resp.  rate 20, height 5\' 3"  (1.6 m), weight 43.7 kg, SpO2 93 %. Medical Problem List and Plan: 1.  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  Antithrombotics: -DVT/anticoagulation: Eliquis [employer]: Other (comment)             -antiplatelet therapy: N/A 3. Pain Management: Tylenol as needed 4. Mood: Wellbutrin 75 mg twice daily             -antipsychotic agents: N/A 5. Neuropsych: This patient is not capable of making decisions on her own behalf. 6. Skin/Wound Care: Routine skin checks 7. Fluids/Electrolytes/Nutrition:   -protein for low albumin 8.  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 12/03/2020-   -MBS demonstrated improvement in oropharyngeal function with apraxia  -D 2  nectars  Advance diet as tolerated 9.  Atrial fibrillation.  Eliquis.  Amiodarone 200 mg daily, Lopressor 12.5 mg twice daily.  Cardiac rate controlled at present 10.  Hyperlipidemia.  Zetia 11.  COPD. Stable -simplified regimen to  Pulmicort and prn duoneb, doing well with this 12.  Acute on chronic diastolic congestive heart failure.     Filed Weights   01/10/21 0500 01/11/21 0500 01/12/21 0530  Weight: 45.5 kg 46.2 kg 43.7 kg   ?Reliability on 9/30 13. HTN:     Lopressor 50mg  bid  Labile on 9/30, as needed hydralazine 14. Constipation   - stools soft--holding miralax, also holding senna-s at hs 15.  Elevated BUN  Free water flushes increased on 9/30  LOS: 22 days A FACE TO FACE EVALUATION WAS PERFORMED  Beverly Li 10/30 01/12/2021, 9:21 AM

## 2021-01-12 NOTE — Progress Notes (Signed)
Speech Language Pathology Discharge Summary  Patient Details  Name: Beverly Li MRN: [id number] Date of Birth: 1945/12/21  Today's Date: 01/15/2021 SLP Individual Time: 0700-0730 SLP Individual Time Calculation (min): 30 min   Skilled Therapeutic Interventions: Skilled treatment session focused on dysphagia and communication goals. SLP facilitated session by providing overall Min A verbal cues for use of swallowing compensatory strategies with breakfast meal of Dys. 2 textures with nectar-thick liquids. Patient consumed meal without overt s/s of aspiration. Recommend patient continue current diet. Patient answered all yes/no questions verbally with extra time and utilized multiple spontaneous verbalizations, however, suspect word-finding deficits impacting overall thought and message. Patient left upright in bed with alarm on and all needs within reach. Continue with current plan of care.    Patient has met 7 of 7 long term goals.  Patient to discharge at [facility] Max;Mod level.   Reasons goals not met: N/A   Clinical Impression/Discharge Summary: Patient has made excellent gains and has met all LTGs this admission. Currently, patient is consuming Dys. 2 textures with nectar-thick liquids with minimal overt s/s of aspiration and requires overall supervision level verbal cues for use of swallowing compensatory strategies. Patient demonstrates mildly prolonged mastication and mild oral residue and anterior spillage due to moderate oral-motor weakness. Patient continues to demonstrate consistent overt s/s of aspiration with thin liquids via cup, therefore, a Provale cup has been initiated to assist with bolus size. However, intermittent overt s/s of aspiration remain. Patient will not need a repeat MBS prior to upgrade to thin liquids and can be upgraded at the bedside. Patient has made significant improvements in functional communication. Patient's auditory comprehension is essentially intact for  basic information, however, function can be impacted when following commands due to motor planning deficits. Patient can utilize a Data processing manager for communication but has been utilizing verbal expression at the word and phrase level instead. 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 Patient is essentially aware of phonemic errors and responds best to sentence completion, phonemic and articulatory cues. Patient also demonstrates mild deficits in selective attention. Patient and family education is complete at this time. Patient's family is unable to provide the necessary level of assistance needed at this time, therefore, patient will discharge to a SNF. Patient would benefit from f/u SLP intervention to maximize her cognitive-linguistic and swallowing function in order to reduce caregiver burden.   Care Partner:  Caregiver Able to Provide Assistance: No  Type of Caregiver Assistance: Physical;Cognitive  Recommendation:  24 hour supervision/assistance;Skilled Nursing facility  Rationale for SLP Follow Up: Maximize swallowing safety;Maximize functional communication;Reduce caregiver burden   Equipment: Proval cup for thin liquid trials   Reasons for discharge: Discharged from hospital;Treatment goals met   Patient/Family Agrees with Progress Made and Goals Achieved: Yes    [place], [place] 01/12/2021, 6:23 AM

## 2021-01-12 NOTE — Progress Notes (Signed)
Speech Language Pathology Daily Session Note  Patient Details  Name: Beverly Li MRN: [id number] Date of Birth: 16-Jul-1945  Today's Date: 01/12/2021 SLP Individual Time: 0930-1010 SLP Individual Time Calculation (min): 40 min  Short Term Goals: Week 3: SLP Short Term Goal 1 (Week 3): Patient will consume thin liquids with minimal overt s/s of aspiration with supervision verbal cues for use of small sips via cup over 2 sessions to assess readiness for upgrade. SLP Short Term Goal 2 (Week 3): Patient will consume current diet with minimal overt s/s of aspiration with Min verbal cues for use of swallowing compnesatory strategies. SLP Short Term Goal 3 (Week 3): Patient will write at the word level with 25% accuracy and Max A multimodal cues. SLP Short Term Goal 4 (Week 3): Patient will demonstrate reading comprehension at the phrase level with 75% accuracy and Min verbal cues. SLP Short Term Goal 5 (Week 3): Patient will verbalize wants/needs at the phrase level with overall Min verbal cues.  Skilled Therapeutic Interventions: Skilled treatment session focused on education. Patient received from OT and reported that she was "too tired to talk." SLP facilitated session by providing extensive education to the patient and her husband regarding current diet recommendations, appropriate textures, swallowing compensatory strategies, medication administration and potential progression of swallowing after discharge. SLP also provided education regarding patient's current auditory comprehension and verbal expression and strategies to utilize to maximize overall functional communication and word-finding. Both verbalized understanding and handouts were given to reinforce information. RN present to administer medications. Patient consumed pills crushed in puree without overt s/s of aspiration. Recommend patient consume all pills crushed in puree and all liquid medications remain via PEG tube. Patient left  upright in wheelchair with husband present. Continue with current plan of care.      Pain No/Denies Pain   Therapy/Group: Individual Therapy  Beverly Li 01/12/2021, 12:03 PM

## 2021-01-12 NOTE — Progress Notes (Signed)
Physical Therapy Session Note  Patient Details  Name: Beverly Li MRN: [id number] Date of Birth: 1945-05-06  Today's Date: 01/12/2021 PT Individual Time: 1502-1532 PT Individual Time Calculation (min): 30 min   Short Term Goals: Week 3:  PT Short Term Goal 1 (Week 3): Pt will perform bed mobility with modA +1. PT Short Term Goal 2 (Week 3): Pt will perform bed to chair transfer consistently with minA. PT Short Term Goal 3 (Week 3): Pt will ambulate 43' with modA +1 and LRAD.  Skilled Therapeutic Interventions/Progress Updates:   First session:  Pt presents supine in bed and agreeable to therapy.  Pt requires increased time for verbal responses to questions, but answers appropriately.  Pt transfers sup to sit w/ min A and able to maintain seated position at EOB.  Pt expresses need to use BR.  Pt required total A for donning shoes.  Pt transfers bed > w/c w/ mod A.  Pt able to elevate RLE for wheel into BR.  Pt required mod A for transfer to commode, w/ 2nd person managing clothing.  Pt continent of urine in toilet, NT to chart.  Pt stood and total A for pericare and clothing management.  Pt required total A for donning R AFO and Giv-mor to R UE.  Pt wheeled to main gym.  Pt required mod A for sit to stand w/ HW but verbal cues for forward lean and hand placement.  Pt able to step w/ HW and mod A blocking R knee.  Pt returned to room and remained sitting w/ chair alarm on and friend in room for all needs.  Second session:  Pt performed multiple sit to stand transfers w/ mod A after cueing for forward scoot and hand placement.  Pt required manual cueing for scooting back in chair.  Pt also tlling PT about sons and grandkids in pictures on wall during seated rest breaks.  Pt remained sitting in w/c w/ chair alarm on and all needs in reach.     Therapy Documentation Precautions:  Precautions Precautions: Fall, Other (comment) Precaution Comments: R hemiplegia with R shoulder subluxation, PEG,  adominal binder; bladder/bowel incontinence Restrictions Weight Bearing Restrictions: No General:   Vital Signs: Therapy Vitals Pulse Rate: 60 Resp: 20 BP: 119/77 Patient Position (if appropriate): Sitting Oxygen Therapy SpO2: 99 % O2 Device: Room Air Pain: no c/o pain.      Therapy/Group: Individual Therapy  Lucio Edward 01/12/2021, 4:20 PM

## 2021-01-12 NOTE — Progress Notes (Signed)
Occupational Therapy Session Note  Patient Details  Name: Beverly Li MRN: [id number] Date of Birth: [birth year]  Today's Date: 01/12/2021 OT Individual Time: 1030-1314 OT Individual Time Calculation (min): 56 min    Short Term Goals: Week 3:  OT Short Term Goal 1 (Week 3): Pt will perform mod A transfer to [facility] OT Short Term Goal 2 (Week 3): Pt will use R UE as s stabilizer with mod A OT Short Term Goal 3 (Week 3): Pt will perform UB dressing with mod A  Skilled Therapeutic Interventions/Progress Updates:    Pt greeted semi-reclined in bed and had been incontinent of urine. Agreeable to shower today and when asked how she slept pt stated, " I slept good." Pt completed bed mobility with max A, then amble to achieve static sitting balance with close supervision. Mod A stand-pivot transfers throughout session, bed>wc>tub bench>wc. Bathing from tub bench with improved initiation and thoroughness when washing body parts. Hand over hand A to integrate R UE for neuro re-ed, overall mod A. Dressing tasks seated in wc at the sink with pt able to help thread both pant legs with OT assist to get R LE into figure 4 position. Mod A sit<>stands at the sink. R UE NMR standing at the sink with weight bearing paper towel pushes. Trace scap and shoulder activation noted. Pt handoff to SLP for next therapy session.   OT placed SAEBO e-stim placed on shoulder for sublux. SAEBO left on for 60 minutes. OT returned to remove SAEBO with skin intact and no adverse reactions.  Sa[place]o Stim One 330 pulse width 35 Hz pulse rate On 8 sec/ off 8 sec Ramp up/ down 2 sec Symmetrical Biphasic wave form  Max intensity at 500 Ohm load  Therapy Documentation Precautions:  Precautions Precautions: Fall, Other (comment) Precaution Comments: R hemiplegia with R shoulder subluxation, PEG, adominal binder; bladder/bowel incontinence Restrictions Weight Bearing Restrictions: No Pain:  Denies pain   Therapy/Group:  Individual Therapy  Mal Amabile 01/12/2021, 9:39 AM

## 2021-01-14 MED ORDER — FREE WATER: 250.0000 mL | Freq: Every day | Status: DC

## 2021-01-14 NOTE — Progress Notes (Signed)
Physical Therapy Session Note  Patient Details  Name: Beverly Li MRN: [id number] Date of Birth: [birth year]  Today's Date: 01/14/2021 PT Individual Time: 1120-1205 PT Individual Time Calculation (min): 45 min   Short Term Goals: Week 3:  PT Short Term Goal 1 (Week 3): Pt will perform bed mobility with modA +1. PT Short Term Goal 2 (Week 3): Pt will perform bed to chair transfer consistently with minA. PT Short Term Goal 3 (Week 3): Pt will ambulate 97' with modA +1 and LRAD.  Skilled Therapeutic Interventions/Progress Updates:     Patient in bed with her husband assisting patient with donning pants bed level upon PT arrival. Patient alert and agreeable to PT session. Patient denied pain during session.  Noted increased work of breathing with mild wheezing this session, SPO2 98%, HR 72 bpm. Per patient's husband, patient did not receive nebulizer treatment this morning, RN confirmed and made aware of symptoms.   Therapeutic Activity: Bed Mobility: Patient performed supine to sit with min-mod A and increased time. Provided verbal cues for sequencing, log roll technique, and use of core to improve trunk control. Patient sat EOB with supervision-min A >5 min throughout session. Provided cues for erect posture and attention to R arm positioning. Transfers: Patient performed sit to/from stand x3 and stand pivot x1 with min-mod A with out AD. Provided verbal cues for scooting forward with man A to facilitate R reciprocal scooting, forward weight shift, and R hip/knee/trunk extension with facilitation at the knee. Attempted to don anterior AFO, however, patient unable to reach neutral DF with heel coming out of shoe x2 trials with DF stretch in between. Removed brace and plan to discuss with lead PT tomorrow.   Discussed at length about d/c instructions and care for hemi body at d/c with patient and husband. Educated on positioning in bed and in sitting, donning sling with standing activities,  increasing attention and incorporating use of R hemi-body, allow time for patient to express herself verbally and not completing words for her, and encouraging patient to advocate for herself about how she is feeling or what she needs.   Patient's husband with several questions about equipment to go with patient to [facility] versus what would be provided there. PT education about following-up with lead PT about AFO and Giv Mohr sling tomorrow, but that DME would be provided by [facility]. Patient and her husband stated understanding.  Patient in w/c with R arm elevated on lap tray and pillow and her husband in the room at end of session with breaks locked, and all needs within reach.   Therapy Documentation Precautions:  Precautions Precautions: Fall, Other (comment) Precaution Comments: R hemiplegia with R shoulder subluxation, PEG, adominal binder; bladder/bowel incontinence Restrictions Weight Bearing Restrictions: No    Therapy/Group: Individual Therapy  Georgianne Gritz L Nell Schrack PT, DPT  01/14/2021, 12:16 PM

## 2021-01-14 NOTE — Progress Notes (Signed)
Occupational Therapy Session Note  Patient Details  Name: Beverly Li MRN: [id number] Date of Birth: 08/30/1945  Today's Date: 01/14/2021 OT Individual Time: 5003-7048 OT Individual Time Calculation (min): 42 min    Short Term Goals: Week 3:  OT Short Term Goal 1 (Week 3): Pt will perform mod A transfer to [facility] OT Short Term Goal 2 (Week 3): Pt will use R UE as s stabilizer with mod A OT Short Term Goal 3 (Week 3): Pt will perform UB dressing with mod A  Skilled Therapeutic Interventions/Progress Updates:    Pt resting in w/c upon arrival with husband and son present. Pt verbalized that she was ready for therapy. OT intervention with focus on RUE PROM/AAROM, sit<>stand, and table tasks with colored peg board. AAROM for RUE towel slides (horizontal abduction/adduction and scapula protraction/retraction). No active movement detected. Pt able to initiate reciprocal scooting in w/c (Lt side) in preparation for sit<>stand from w/c. Sit<>stand from w/c with mod A. Sit<>stand x 5. Colored peg board tasks: completed alternating pattern with supervision; min verbal cues for cross pattern with five colors. Pt self corrected X 2 during task. Pt returned to room and remained seated in w/c. Half lap tray in place. Husbnad and son present.   Therapy Documentation Precautions:  Precautions Precautions: Fall, Other (comment) Precaution Comments: R hemiplegia with R shoulder subluxation, PEG, adominal binder; bladder/bowel incontinence Restrictions Weight Bearing Restrictions: No Pain:  Pt denies pain this afternoon; no s/s of pain   Therapy/Group: Individual Therapy  Rich Brave 01/14/2021, 2:35 PM

## 2021-01-14 NOTE — Progress Notes (Signed)
Inpatient Rehabilitation Discharge Medication Review by a Pharmacist  A complete drug regimen review was completed for this patient to identify any potential clinically significant medication issues.  High Risk Drug Classes Is patient taking? Indication by Medication  Antipsychotic No   Anticoagulant Yes Eliquis 5 mg BID per tube for Atrial Fibrillation  Antibiotic No   Opioid No   Antiplatelet No   Hypoglycemics/insulin No   Vasoactive Medication No   Chemotherapy No   Other Yes Amiodarone 200 mg daily for Atrial Fibrillation     Type of Medication Issue Identified Description of Issue Recommendation(s)  Drug Interaction(s) (clinically significant)     Duplicate Therapy  Pantoprazole ordered twice Discontinue one of the pantoprazole orders  Allergy     No Medication Administration End Date     Incorrect Dose     Additional Drug Therapy Needed  Boost supplements not on discharge orders.  Order for discharge or instruct patient to obtain as it is an OTC product.  Significant med changes from prior encounter (inform family/care partners about these prior to discharge).    Other  Patient's hydralazine was discontinued after acute care stay.  Educate patient that this medicine has been discontinued    Clinically significant medication issues were identified that warrant physician communication and completion of prescribed/recommended actions by midnight of the next day:  No  Name of provider notified for urgent issues identified: Mariam Dollar, PA  Provider Method of Notification: Secure Chat  Time spent performing this drug regimen review (minutes):  10 minutes  Thank you for involving pharmacy in this patient's care.  Arnette Felts, PharmD PGY1 Ambulatory Care Pharmacy Resident 01/14/2021 2:50 PM  **Pharmacist phone directory can be found on [email or web address] listed under [facility]**

## 2021-01-14 NOTE — Progress Notes (Signed)
PROGRESS NOTE   Subjective/Complaints:  Pt has no complaints- "is good"- Aphasic- husband feeding pt.    Review of Systems  Unable to perform ROS: Language  persistent  Objective:   No results found. No results for input(s): WBC, HGB, HCT, PLT in the last 72 hours.   Recent Labs    01/12/21 0451  NA 141  K 3.8  CL 104  CO2 27  GLUCOSE 92  BUN 29*  CREATININE 0.85  CALCIUM 9.1     Intake/Output Summary (Last 24 hours) at 01/14/2021 1053 Last data filed at 01/13/2021 1304 Gross per 24 hour  Intake 60 ml  Output --  Net 60 ml          Physical Exam: Vital Signs Blood pressure (!) 164/106, pulse 78, temperature 97.7 F (36.5 C), temperature source Oral, resp. rate 20, height 5\' 3"  (1.6 m), weight 43.9 kg, SpO2 92 %.    General: awake, alert, appropriate, apahsic; husband feeding pt; NAD HENT: conjugate gaze; oropharynx moist CV: regular rate; no JVD Pulmonary: CTA B/L; no W/R/R- good air movement GI: soft, NT, ND, (+)BS; hypoactive Psychiatric: appropriate Neurological: aphasic- said "good" with effort  Musc: No edema in extremities.  No tenderness in extremities. Neuro: Alert Exp>>rec, relatively stable Dysarthria Motor: RUE 0/5, RLE 0/5 No increase in tone appreciated  Assessment/Plan: 1. Functional deficits which require 3+ hours per day of interdisciplinary therapy in a comprehensive inpatient rehab setting. Physiatrist is providing close team supervision and 24 hour management of active medical problems listed below. Physiatrist and rehab team continue to assess barriers to discharge/monitor patient progress toward functional and medical goals  Care Tool:  Bathing    Body parts bathed by patient: Face, Left upper leg, Right upper leg, Abdomen, Chest, Right arm, Front perineal area   Body parts bathed by helper: Left arm, Buttocks, Left lower leg, Right lower leg, Face     Bathing  assist Assist Level: Moderate Assistance - Patient 50 - 74%     Upper Body Dressing/Undressing Upper body dressing   What is the patient wearing?: Pull over shirt    Upper body assist Assist Level: Moderate Assistance - Patient 50 - 74%    Lower Body Dressing/Undressing Lower body dressing      What is the patient wearing?: Pants     Lower body assist Assist for lower body dressing: Moderate Assistance - Patient 50 - 74%     Toileting Toileting    Toileting assist Assist for toileting: Total Assistance - Patient < 25%     Transfers Chair/bed transfer  Transfers assist     Chair/bed transfer assist level: Moderate Assistance - Patient 50 - 74%     Locomotion Ambulation   Ambulation assist      Assist level: Moderate Assistance - Patient 50 - 74% Assistive device: Walker-rolling (R hand splint) Max distance: 22'   Walk 10 feet activity   Assist     Assist level: Moderate Assistance - Patient - 50 - 74% Assistive device: Walker-rolling   Walk 50 feet activity   Assist Walk 50 feet with 2 turns activity did not occur: Safety/medical concerns  Walk 150 feet activity   Assist Walk 150 feet activity did not occur: Safety/medical concerns         Walk 10 feet on uneven surface  activity   Assist Walk 10 feet on uneven surfaces activity did not occur: Safety/medical concerns         Wheelchair     Assist Is the patient using a wheelchair?: Yes Type of Wheelchair: Manual    Wheelchair assist level: Dependent - Patient 0% Max wheelchair distance: 150'    Wheelchair 50 feet with 2 turns activity    Assist        Assist Level: Dependent - Patient 0%   Wheelchair 150 feet activity     Assist      Assist Level: Dependent - Patient 0%   Blood pressure (!) 164/106, pulse 78, temperature 97.7 F (36.5 C), temperature source Oral, resp. rate 20, height 5\' 3"  (1.6 m), weight 43.9 kg, SpO2 92 %. Medical Problem  List and Plan: 1.  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  Antithrombotics: -DVT/anticoagulation: Eliquis [employer]: Other (comment)             -antiplatelet therapy: N/A 3. Pain Management: Tylenol as needed 4. Mood: Wellbutrin 75 mg twice daily             -antipsychotic agents: N/A 5. Neuropsych: This patient is not capable of making decisions on her own behalf. 6. Skin/Wound Care: Routine skin checks 7. Fluids/Electrolytes/Nutrition:   -protein for low albumin 8.  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 12/03/2020-   -MBS demonstrated improvement in oropharyngeal function with apraxia  -D 2  nectars  Advance diet as tolerated  10/2- husband feeding pt- con't regimen 9.  Atrial fibrillation.  Eliquis.  Amiodarone 200 mg daily, Lopressor 12.5 mg twice daily.  Cardiac rate controlled at present 10.  Hyperlipidemia.  Zetia 11.  COPD. Stable -simplified regimen to  Pulmicort and prn duoneb, doing well with this 12.  Acute on chronic diastolic congestive heart failure.     Filed Weights   01/12/21 0530 01/13/21 0559 01/14/21 0500  Weight: 43.7 kg 42.9 kg 43.9 kg   ?Reliability on 9/30 13. HTN:     Lopressor 50mg  bid  Labile on 9/30, as needed hydralazine 14. Constipation   - stools soft--holding miralax, also holding senna-s at hs 15.  Elevated BUN  Free water flushes increased on 9/30  LOS: 24 days A FACE TO FACE EVALUATION WAS PERFORMED  Tressa Maldonado 01/14/2021, 10:53 AM

## 2021-01-14 NOTE — Progress Notes (Signed)
Physical Therapy Discharge Summary  Patient Details  Name: Beverly Li MRN: [id number] Date of Birth: [birth year]  Today's Date: 01/15/2021 PT Individual Time: 0901-0930 PT Individual Time Calculation (min): 29 min    Patient has met 1 of 8 long term goals due to improved activity tolerance, improved balance, improved postural control, increased strength, ability to compensate for deficits, improved attention, improved awareness, and improved coordination.  Patient to discharge at a wheelchair level [place].  Pt's husband present for multiple therapy session and is independent with direction of pt care.  Reasons goals not met: Pt made slow progress toward mobility goals secondary to severity of hemiplegia. Pt did not regain voluntary activation of R hemibody, limiting independence with bed mobility, transfers, and ambulation.   Recommendation:  Patient will benefit from ongoing skilled PT services in skilled nursing facility setting to continue to advance safe functional mobility, address ongoing impairments in R hemibody strength, balance, transfers, ambulation, and minimize fall risk.  Equipment: No equipment provided  Reasons for discharge: discharge from hospital  Patient/family agrees with progress made and goals achieved: Yes  Skilled therapeutic Interventions:  Pt received seated in [facility] and agrees to therapy. WC transport to gym for time management. Pt performs sit to stand with modA and PT providing verbal and tactile cues for sequencing and body mechanics. In standing, pt has posterior bias, initially requiring modA for balance but gradually improving to minA/CGA with multimodal cues for hip extension and posture to improve balance. PT discusses AFO with pt and husband and coordinates delivery of AFO prior to pt DC. WC transport back to room. Pt left seated with all needs within reach.  PT Discharge Precautions/Restrictions Precautions Precautions: Fall Precaution  Comments: R hemiplegia with R shoulder subluxation, PEG, Restrictions Weight Bearing Restrictions: No Pain Interference Pain Interference Pain Effect on Sleep: 1. Rarely or not at all Pain Interference with Therapy Activities: 1. Rarely or not at all Pain Interference with Day-to-Day Activities: 1. Rarely or not at all Vision/Perception  Vision - History Ability to See in Adequate Light:  (Aphasic and UTA vision adequately) Perception Perception: Impaired Praxis Praxis: Impaired Praxis Impairment Details: Initiation  Cognition Overall Cognitive Status: Impaired/Different from baseline Arousal/Alertness: Awake/alert Orientation Level: Oriented to person Year:  Pincus Badder) Month: October Day of Week: Incorrect Focused Attention: Appears intact Sustained Attention: Appears intact Selective Attention: Impaired Memory: Impaired Immediate Memory Recall: Sock;Blue;Bed Memory Recall Sock: With Cue Memory Recall Blue: With Cue Memory Recall Bed: With Cue Problem Solving: Impaired Safety/Judgment: Impaired Sensation Sensation Light Touch: Appears Intact Additional Comments: Pt provides infrequent and inconsistent responses, but does react to deep pressure. Coordination Gross Motor Movements are Fluid and Coordinated: No Fine Motor Movements are Fluid and Coordinated: No Motor  Motor Motor: Hemiplegia  Mobility Bed Mobility Bed Mobility: Supine to Sit;Sit to Supine Supine to Sit: Maximal Assistance - Patient - Patient 25-49% Sit to Supine: Maximal Assistance - Patient 25-49% Transfers Transfers: Sit to Stand;Stand to Sit;Stand Pivot Transfers;Squat Pivot Transfers Sit to Stand: Moderate Assistance - Patient 50-74% Stand to Sit: Moderate Assistance - Patient 50-74% Stand Pivot Transfers: Maximal Assistance - Patient 25 - 49% Stand Pivot Transfer Details: Verbal cues for sequencing;Verbal cues for technique;Verbal cues for precautions/safety;Tactile cues for weight shifting;Tactile  cues for sequencing;Tactile cues for initiation;Tactile cues for posture Squat Pivot Transfers: Moderate Assistance - Patient 50-74% Transfer (Assistive device): 1 person hand held assist Transfer via Lift Equipment: Stedy Trunk/Postural Assessment  Cervical Assessment Cervical Assessment:  (forward head posture) Thoracic  Assessment Thoracic Assessment:  (rounded shoulders) Lumbar Assessment Lumbar Assessment:  (posterior pelvic tilt) Postural Control Postural Control: Deficits on evaluation Righting Reactions: delayed and inadequate Protective Responses: delayed and inadequate  Balance Balance Balance Assessed: Yes Static Sitting Balance Static Sitting - Balance Support: Feet supported Static Sitting - Level of Assistance: 3: Mod assist Dynamic Sitting Balance Dynamic Sitting - Balance Support: Feet supported Dynamic Sitting - Level of Assistance: 2: Max assist Sitting balance - Comments: UE support and min-modA to sit statically EOB. Static Standing Balance Static Standing - Balance Support: During functional activity Static Standing - Level of Assistance: 2: Max assist Dynamic Standing Balance Dynamic Standing - Balance Support: During functional activity Dynamic Standing - Level of Assistance: 1: +1 Total assist Extremity Assessment  RUE Assessment RUE Assessment: Exceptions to [facility] Passive Range of Motion (PROM) Comments: 0-- RUE Body System: Neuro Brunstrum levels for arm and hand: Arm;Hand Brunstrum level for arm: Stage I Presynergy Brunstrum level for hand: Stage I Flaccidity LUE Assessment LUE Assessment: Within Functional Limits General Strength Comments: generalized weakness RLE Assessment General Strength Comments: No voluntary activation, but notable activation with WB LLE Assessment General Strength Comments: Grossly 4/5    Breck Coons, PT, DPT 01/15/2021, 1:29 PM

## 2021-01-15 ENCOUNTER — Encounter: Payer: Self-pay | Admitting: Internal Medicine

## 2021-01-15 ENCOUNTER — Non-Acute Institutional Stay (SKILLED_NURSING_FACILITY): Payer: Medicare Other | Admitting: Internal Medicine

## 2021-01-15 DIAGNOSIS — I63512 Cerebral infarction due to unspecified occlusion or stenosis of left middle cerebral artery: Secondary | ICD-10-CM | POA: Diagnosis not present

## 2021-01-15 DIAGNOSIS — J439 Emphysema, unspecified: Secondary | ICD-10-CM

## 2021-01-15 DIAGNOSIS — I69391 Dysphagia following cerebral infarction: Secondary | ICD-10-CM | POA: Diagnosis not present

## 2021-01-15 DIAGNOSIS — I10 Essential (primary) hypertension: Secondary | ICD-10-CM | POA: Diagnosis not present

## 2021-01-15 DIAGNOSIS — I480 Paroxysmal atrial fibrillation: Secondary | ICD-10-CM | POA: Diagnosis not present

## 2021-01-15 DIAGNOSIS — F339 Major depressive disorder, recurrent, unspecified: Secondary | ICD-10-CM

## 2021-01-15 DIAGNOSIS — K219 Gastro-esophageal reflux disease without esophagitis: Secondary | ICD-10-CM

## 2021-01-15 LAB — CBC WITH DIFFERENTIAL/PLATELET
Abs Immature Granulocytes: 0.05 10*3/uL (ref 0.00–0.07)
Basophils Absolute: 0.1 10*3/uL (ref 0.0–0.1)
Basophils Relative: 1 %
Eosinophils Absolute: 0.1 10*3/uL (ref 0.0–0.5)
Eosinophils Relative: 1 %
HCT: 47.5 % — ABNORMAL HIGH (ref 36.0–46.0)
Hemoglobin: 15.3 g/dL — ABNORMAL HIGH (ref 12.0–15.0)
Immature Granulocytes: 1 %
Lymphocytes Relative: 29 %
Lymphs Abs: 2.5 10*3/uL (ref 0.7–4.0)
MCH: 30.7 pg (ref 26.0–34.0)
MCHC: 32.2 g/dL (ref 30.0–36.0)
MCV: 95.2 fL (ref 80.0–100.0)
Monocytes Absolute: 0.6 10*3/uL (ref 0.1–1.0)
Monocytes Relative: 7 %
Neutro Abs: 5.5 10*3/uL (ref 1.7–7.7)
Neutrophils Relative %: 61 %
Platelets: 234 10*3/uL (ref 150–400)
RBC: 4.99 MIL/uL (ref 3.87–5.11)
RDW: 15 % (ref 11.5–15.5)
WBC: 8.8 10*3/uL (ref 4.0–10.5)
nRBC: 0 % (ref 0.0–0.2)

## 2021-01-15 LAB — BASIC METABOLIC PANEL
Anion gap: 9 (ref 5–15)
BUN: 26 mg/dL — ABNORMAL HIGH (ref 8–23)
CO2: 29 mmol/L (ref 22–32)
Calcium: 9.1 mg/dL (ref 8.9–10.3)
Chloride: 104 mmol/L (ref 98–111)
Creatinine, Ser: 0.82 mg/dL (ref 0.44–1.00)
GFR, Estimated: >60 mL/min (ref >60)
Glucose, Bld: 93 mg/dL (ref 70–99)
Potassium: 3.6 mmol/L (ref 3.5–5.1)
Sodium: 142 mmol/L (ref 135–145)

## 2021-01-15 LAB — SARS CORONAVIRUS 2 (TAT 6-24 HRS): SARS Coronavirus 2: NEGATIVE

## 2021-01-15 NOTE — Progress Notes (Signed)
Inpatient Rehabilitation Care Coordinator Discharge Note   Patient Details  Name: SHENA VINLUAN MRN: [id number] Date of Birth: 30-Sep-1945   Discharge location: D/c to [facility]  Length of Stay: 24 days  Discharge activity level: W/c level Min Asst  Home/community participation: Limited  Patient response TF:TDDUKG Literacy - How often do you need to have someone help you when you read instructions, pamphlets, or other written material from your doctor or pharmacy?: Often  Patient response UR:KYHCWC Isolation - How often do you feel lonely or isolated from those around you?: Patient unable to respond  Services provided included: MD, RD, PT, OT, SLP, CM, Pharmacy, RN, TR, Neuropsych, SW  Financial Services:  Field seismologist Utilized: Private Insurance [facility] Medicare  Choices offered to/list presented to: yes  Follow-up services arranged:   Patient response to transportation need: Is the patient able to respond to transportation needs?: Yes In the past 12 months, has lack of transportation kept you from medical appointments or from getting medications?: No In the past 12 months, has lack of transportation kept you from meetings, work, or from getting things needed for daily living?: No   Comments (or additional information):  Patient/Family verbalized understanding of follow-up arrangements:  Yes  Individual responsible for coordination of the follow-up plan: contact pt husband Chrissie Noa [phone]  Confirmed correct DME delivered: Gretchen Short 01/15/2021    Gretchen Short

## 2021-01-15 NOTE — Progress Notes (Addendum)
Provider:  Einar Crow MD Location:  [facility] Room Number: 116 Place of Service:  SNF [phone])  PCP: [name], MD Patient Care Team: [name], MD as PCP - General (Internal Medicine) [name], MD as PCP - Cardiology (Cardiology) Duke Salvia, MD as PCP - Electrophysiology (Cardiology) Shirlean Kelly, MD as Consulting Physician (Neurosurgery)  Extended Emergency Contact Information Primary Emergency Contact: Federer,[name] Address: [address]          [place] [phone] [name] Home Phone: [phone] Mobile Phone: [phone] Relation: Spouse Secondary Emergency Contact: Sando,[name] Address: [address]          [place], [place] [address] [name] Mobile Phone: [phone] Relation: Son  Code Status: Full Code Goals of Care: Advanced Directive information Advanced Directives 01/15/2021  Does Patient Have a Medical Advance Directive? No  Type of Advance Directive -  Does patient want to make changes to medical advance directive? No - Patient declined  Copy of Healthcare Power of Attorney in Chart? -  Would patient like information on creating a medical advance directive? No - Patient declined;No - Guardian declined      Chief Complaint  Patient presents with   New Admit To SNF    Admission to SNF    HPI: Patient is a 75 y.o. 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 Patient was off her Eliquis at that time due to Hematoma. She had thrombectomy performed by IR. Also had PEG tube placed for her  severe Dysphagia  She was discharged to [facility] but there she had acute respiratory failure was taken again to the hospital and was found to have Aspiration Pneumonia Was treated with Rocephin and Flagyl followed by Cefdinir She was discharged to Inpatient Rehab in [facility] Per husband she has done well . Continues to have aphasia and Right  sided neglect But speech is better. Is able to take some food PO under supervision Also was walking with Mod Assist from Therapy But unable to raise her Right leg or Arm She is getting Dysphagia diet and  Tube feeding for her Protein and Liquid  During this time also diagnosed with Emphysema does have h/o Smoking in the past  Patient is unable to give detail history due to her aphasia But able to answer in few words C/o Not sleeping well. Also does not like her leg and arm brace But no other issues right now    Past Medical History:  Diagnosis Date   Anxiety    Arthritis    "some in my lower back; probably elbows, knees" (11/18/2017)   Atrial fibrillation (HCC)    Bell's palsy    when pt. was 75 yrs old, when under stress the left side of face will droop.   Complication of anesthesia    "vascular OR 2016; BP bottomed out; couldn't get it regulated; ended up in ICU for DAYS" (11/18/2017)   GERD (gastroesophageal reflux disease)    History of kidney stones    Hypertension  Hypertrophic cardiomyopathy (HCC)    severe LV basilar hypertrophy witn no evidence of significant outflow tract obstruction, EF 65-70%, mild LAE, mild TR, grade 1a diastolic dysfunction 05/15/10 (Dr. Donato Schultz) (Atrial Septal Hypertrophy pattern)-- Intra-op TEE with dsignificant outflow tract obstruction - AI, MR & TR   Insomnia    Mild aortic sclerosis    Osteopenia    Peripheral vascular disease (HCC)    Syncope    , Vagal   Past Surgical History:  Procedure Laterality Date   AUGMENTATION MAMMAPLASTY Bilateral    BACK SURGERY     CARDIAC CATHETERIZATION N/A  05/07/2016   Procedure: Left Heart Cath and Coronary Angiography;  Surgeon: Kathleene Hazel, MD;  Location: [facility] INVASIVE CV LAB;  Service: Cardiovascular;  Laterality: N/A;   CARDIOVERSION N/A 09/24/2017   Procedure: CARDIOVERSION;  Surgeon: Laurey Morale, MD;  Location: [facility] ENDOSCOPY;  Service: Cardiovascular;  Laterality: N/A;   DILATION AND CURETTAGE OF UTERUS     ENDARTERECTOMY FEMORAL Right 03/02/2015   Procedure: ENDARTERECTOMY RIGHT FEMORAL;  Surgeon: Chuck Hint, MD;  Location: [facility] OR;  Service: Vascular;  Laterality: Right;   ESOPHAGOGASTRODUODENOSCOPY (EGD) WITH PROPOFOL N/A 12/01/2020   Procedure: ESOPHAGOGASTRODUODENOSCOPY (EGD) WITH PROPOFOL;  Surgeon: Diamantina Monks, MD;  Location: [facility] ENDOSCOPY;  Service: General;  Laterality: N/A;   FACIAL COSMETIC SURGERY Left 2002   "related to Bell's Palsy @ age 66; left eye/side of face droopy; tried to make area symmetrical"   FEMORAL-POPLITEAL BYPASS GRAFT Right 03/02/2015   Procedure: BYPASS GRAFT FEMORAL-BELOW KNEE POPLITEAL ARTERY;  Surgeon: Chuck Hint, MD;  Location: [facility] OR;  Service: Vascular;  Laterality: Right;   INGUINAL HERNIA REPAIR Bilateral 2002   IR ANGIO INTRA EXTRACRAN SEL COM CAROTID INNOMINATE UNI L MOD SED  11/16/2020   IR CT HEAD LTD  11/13/2020   IR PERCUTANEOUS ART THROMBECTOMY/INFUSION INTRACRANIAL INC DIAG ANGIO  11/13/2020   OVARIAN CYST REMOVAL Left    PEG PLACEMENT N/A 12/01/2020   Procedure: PERCUTANEOUS ENDOSCOPIC GASTROSTOMY (PEG) PLACEMENT;  Surgeon: Diamantina Monks, MD;  Location: [facility] ENDOSCOPY;  Service: General;  Laterality: N/A;   PERIPHERAL VASCULAR CATHETERIZATION N/A 01/16/2015   Procedure: Abdominal Aortogram;  Surgeon: Chuck Hint, MD;  Location: [facility] INVASIVE CV LAB;  Service: Cardiovascular;  Laterality: N/A;   POSTERIOR LUMBAR FUSION  2015   "have plates and screws in there"   RADIOLOGY WITH ANESTHESIA N/A 11/13/2020   Procedure: IR WITH ANESTHESIA;  Surgeon: Radiologist,  Medication, MD;  Location: [facility] OR;  Service: Radiology;  Laterality: N/A;   TONSILLECTOMY      reports that she quit smoking about 6 years ago. Her smoking use included cigarettes. She has a 50.00 pack-year smoking history. She has never used smokeless tobacco. She reports current alcohol use. No history on file for drug use. Social History   Socioeconomic History   Marital status: Married    Spouse name: Not on file   Number of children: Not on file   Years of education: Not on file   Highest education level: Not on file  Occupational History   Not on file  Tobacco Use   Smoking status: Former    Packs/day: 1.00    Years: 50.00    Pack years: 50.00    Types: Cigarettes    Quit date: 12/15/2014    Years since quitting: 6.0   Smokeless tobacco: Never  Vaping Use   Vaping Use: Never used  Substance and Sexual Activity   Alcohol use:  Yes    Comment: 11/18/2017 "might have a couple glasses of wine/month; if that"   Drug use: Not on file   Sexual activity: Not Currently  Other Topics Concern   Not on file  Social History Narrative   Not on file   Social Determinants of Health   Financial Resource Strain: Not on file  Food Insecurity: Not on file  Transportation Needs: Not on file  Physical Activity: Not on file  Stress: Not on file  Social Connections: Not on file  Intimate Partner Violence: Not on file    Functional Status Survey:    Family History  Problem Relation Age of Onset   Liver cancer Mother    Cancer Mother        Liver   Hypertension Mother    Lung cancer Father    Cancer Father        Lung   Breast cancer Sister    Cancer Sister        Breast    Health Maintenance  Topic Date Due   Hepatitis C Screening  Never done   Zoster Vaccines- Shingrix (1 of 2) Never done   COLONOSCOPY (Pts 45-62yrs Insurance coverage will need to be confirmed)  Never done   DEXA SCAN  Never done   COVID-19 Vaccine (4 - Booster for Pfizer series) 04/20/2020   INFLUENZA  VACCINE  11/13/2020   TETANUS/TDAP  05/20/2023   HPV VACCINES  Aged Out    Allergies  Allergen Reactions   Amoxicillin Other (See Comments)    UTI Has patient had a PCN reaction causing immediate rash, facial/tongue/throat swelling, SOB or lightheadedness with hypotension: No Has patient had a PCN reaction causing severe rash involving mucus membranes or skin necrosis: No Has patient had a PCN reaction that required hospitalization: No Has patient had a PCN reaction occurring within the last 10 years: Yes--UTI ONLY If all of the above answers are "NO", then may proceed with Cephalosporin use.    Atenolol Cough   Crestor [Rosuvastatin Calcium] Other (See Comments)    Muscle aches   Pravastatin Other (See Comments)    Muscle aches   Sulfa Antibiotics Nausea Only   Codeine     hallucinations    Outpatient Encounter Medications as of 01/15/2021  Medication Sig   acetaminophen (TYLENOL) 160 MG/5ML solution Place 20.3 mLs (650 mg total) into feeding tube every 4 (four) hours as needed for mild pain (or temp > 37.5 C (99.5 F)).   amiodarone (PACERONE) 200 MG tablet Place 1 tablet (200 mg total) into feeding tube daily.   apixaban (ELIQUIS) 5 MG TABS tablet Place 1 tablet (5 mg total) into feeding tube 2 (two) times daily.   budesonide (PULMICORT) 0.5 MG/2ML nebulizer solution Take 2 mLs (0.5 mg total) by nebulization 2 (two) times daily.   buPROPion (WELLBUTRIN) 75 MG tablet Place 1 tablet (75 mg total) into feeding tube 2 (two) times daily.   ezetimibe (ZETIA) 10 MG tablet Place 1 tablet (10 mg total) into feeding tube daily.   ipratropium-albuterol (DUONEB) 0.5-2.5 (3) MG/3ML SOLN Take 3 mLs by nebulization every 4 (four) hours as needed.   metoprolol tartrate (LOPRESSOR) 50 MG tablet Place 1 tablet (50 mg total) into feeding tube 2 (two) times daily.   pantoprazole sodium (PROTONIX) 40 mg Place 40 mg into feeding tube daily.   Water For Irrigation, Sterile (FREE WATER) SOLN Place 350  mLs into feeding tube 4 (four) times daily.   Water For Irrigation,  Sterile (FREE WATER) SOLN Place 250 mLs into feeding tube 6 (six) times daily.   [DISCONTINUED] (feeding supplement) PROSource Plus liquid 30 mL    [DISCONTINUED] acetaminophen (TYLENOL) 160 MG/5ML solution 650 mg    [DISCONTINUED] amiodarone (PACERONE) tablet 200 mg    [DISCONTINUED] apixaban (ELIQUIS) tablet 5 mg    [DISCONTINUED] budesonide (PULMICORT) nebulizer solution 0.5 mg    [DISCONTINUED] buPROPion (WELLBUTRIN) tablet 75 mg    [DISCONTINUED] ezetimibe (ZETIA) tablet 10 mg    [DISCONTINUED] feeding supplement (BOOST / RESOURCE BREEZE) liquid 1 Container    [DISCONTINUED] free water 250 mL    [DISCONTINUED] hydrALAZINE (APRESOLINE) tablet 10 mg    [DISCONTINUED] ipratropium-albuterol (DUONEB) 0.5-2.5 (3) MG/3ML nebulizer solution 3 mL    [DISCONTINUED] MEDLINE mouth rinse    [DISCONTINUED] metoprolol tartrate (LOPRESSOR) tablet 50 mg    [DISCONTINUED] pantoprazole sodium (PROTONIX) 40 mg/20 mL oral suspension 40 mg    No facility-administered encounter medications on file as of 01/15/2021.    Review of Systems  Constitutional:  Positive for activity change.  HENT: Negative.    Respiratory: Negative.    Cardiovascular: Negative.   Gastrointestinal: Negative.   Genitourinary: Negative.   Musculoskeletal:  Positive for gait problem.  Skin: Negative.   Neurological:  Positive for weakness.  Psychiatric/Behavioral:  Positive for dysphoric mood and sleep disturbance. The patient is nervous/anxious.    Vitals:   01/16/21 1213  BP: 118/80  Pulse: 71  Resp: 20  Temp: (!) 97.5 F (36.4 C)  SpO2: 99%  Weight: 99 lb (44.9 kg)   Body mass index is 17.54 kg/m. Physical Exam Vitals reviewed.  Constitutional:      Comments: Have Right Sided Droop  HENT:     Head: Normocephalic.     Nose: Nose normal.     Mouth/Throat:     Mouth: Mucous membranes are moist.     Pharynx: Oropharynx is clear.  Eyes:      Pupils: Pupils are equal, round, and reactive to light.  Cardiovascular:     Rate and Rhythm: Normal rate and regular rhythm.     Pulses: Normal pulses.     Heart sounds: Normal heart sounds. No murmur heard. Pulmonary:     Effort: Pulmonary effort is normal. No respiratory distress.     Breath sounds: Normal breath sounds. No wheezing or rales.  Abdominal:     General: Abdomen is flat. Bowel sounds are normal.     Palpations: Abdomen is soft.     Comments: Peg Tube in Place  Musculoskeletal:        General: No swelling.     Cervical back: Neck supple.  Skin:    General: Skin is warm and dry.  Neurological:     Mental Status: She is alert.     Comments: Alert Follows Commands. Right neglect. Has aphasia Flaccid in Right Upper and Lower Extremity 4/5 in Left UE 5/5 in Left Lower   Psychiatric:        Mood and Affect: Mood normal.        Thought Content: Thought content normal.    Labs reviewed: Basic Metabolic Panel: Recent Labs    11/18/20 0100 11/19/20 0344 11/24/20 0351 11/25/20 0038 11/29/20 0128 11/30/20 0826 12/02/20 0216 12/04/20 0052 01/08/21 0810 01/12/21 0451 01/15/21 0603  NA 142   < > 146*   < > 151*   < > 140   < > 140 141 142  K 4.3   < > 3.7   < >  4.4   < > 4.2   < > 3.8 3.8 3.6  CL 111   < > 98   < > 113*   < > 108   < > 103 104 104  CO2 25   < > 40*   < > 30   < > 25   < > GLUCOSE 154*   < > 124*   < > 140*   < > 181*   < > 84 92 93  BUN 32*   < > 46*   < > 60*   < > 51*   < > 27* 29* 26*  CREATININE 0.62   < > 0.81   < > 0.75   < > 0.79   < > 0.86 0.85 0.82  CALCIUM 8.5*   < > 9.2   < > 8.9   < > 8.4*   < > 9.4 9.1 9.1  MG  --   --  2.4  --  2.3  --  2.2  --   --   --   --   PHOS 2.1*  --   --   --  3.4  --  3.0  --   --   --   --    < > = values in this interval not displayed.   Liver Function Tests: Recent Labs    11/13/20 1345 12/02/20 0216 12/12/20 1201 12/22/20 0409  AST 48*  --  35 35  ALT 25  --  48* 47*  ALKPHOS 60  --   83 69  BILITOT 1.7*  --  1.3* 0.6  PROT 5.7*  --  6.5 5.6*  ALBUMIN 3.1* 2.4* 2.9* 2.6*   No results for input(s): LIPASE, AMYLASE in the last 8760 hours. No results for input(s): AMMONIA in the last 8760 hours. CBC: Recent Labs    01/01/21 0713 01/08/21 0810 01/15/21 0603  WBC 6.1 7.8 8.8  NEUTROABS 3.7 4.8 5.5  HGB 14.9 16.6* 15.3*  HCT 46.5* 51.6* 47.5*  MCV 97.1 95.9 95.2  PLT PLATELET CLUMPS NOTED ON SMEAR, UNABLE TO ESTIMATE 283 234   Cardiac Enzymes: No results for input(s): CKTOTAL, CKMB, CKMBINDEX, TROPONINI in the last 8760 hours. BNP: Invalid input(s): POCBNP Lab Results  Component Value Date   HGBA1C 6.0 (H) 11/13/2020   Lab Results  Component Value Date   TSH 1.087 11/18/2020   No results found for: VITAMINB12 No results found for: FOLATE No results found for: IRON, TIBC, FERRITIN  Imaging and Procedures obtained prior to SNF admission: DG Swallowing Func-Speech Pathology  Result Date: 12/21/2020 Table formatting from the original result was not included. Objective Swallowing Evaluation: Type of Study: MBS-Modified Barium Swallow Study  Patient Details Name: Beverly Li MRN: [id number] Date of Birth: 03-04-1946 Today's Date: 12/21/2020 Time: SLP Start Time (ACUTE ONLY): 1250 -SLP Stop Time (ACUTE ONLY): 1310 SLP Time Calculation (min) (ACUTE ONLY): 20 min Past Medical History: Past Medical History: Diagnosis Date  Anxiety   Arthritis   "some in my lower back; probably elbows, knees" (11/18/2017)  Atrial fibrillation (HCC)   Bell's palsy   when pt. was 75 yrs old, when under stress the left side of face will droop.  Complication of anesthesia   "vascular OR 2016; BP bottomed out; couldn't get it regulated; ended up in ICU for DAYS" (11/18/2017)  GERD (gastroesophageal reflux disease)   History of kidney stones   Hypertension   Hypertrophic cardiomyopathy (HCC)  severe LV basilar hypertrophy witn no evidence of significant outflow tract obstruction, EF 65-70%, mild LAE,  mild TR, grade 1a diastolic dysfunction 05/15/10 (Dr. Donato Schultz) (Atrial Septal Hypertrophy pattern)-- Intra-op TEE with dsignificant outflow tract obstruction - AI, MR & TR  Insomnia   Mild aortic sclerosis   Osteopenia   Peripheral vascular disease (HCC)   Syncope   , Vagal Past Surgical History: Past Surgical History: Procedure Laterality Date  AUGMENTATION MAMMAPLASTY Bilateral   BACK SURGERY    CARDIAC CATHETERIZATION N/A 05/07/2016  Procedure: Left Heart Cath and Coronary Angiography;  Surgeon: Kathleene Hazel, MD;  Location: [facility] INVASIVE CV LAB;  Service: Cardiovascular;  Laterality: N/A;  CARDIOVERSION N/A 09/24/2017  Procedure: CARDIOVERSION;  Surgeon: Laurey Morale, MD;  Location: [facility] ENDOSCOPY;  Service: Cardiovascular;  Laterality: N/A;  DILATION AND CURETTAGE OF UTERUS    ENDARTERECTOMY FEMORAL Right 03/02/2015  Procedure: ENDARTERECTOMY RIGHT FEMORAL;  Surgeon: Chuck Hint, MD;  Location: [facility] OR;  Service: Vascular;  Laterality: Right;  ESOPHAGOGASTRODUODENOSCOPY (EGD) WITH PROPOFOL N/A 12/01/2020  Procedure: ESOPHAGOGASTRODUODENOSCOPY (EGD) WITH PROPOFOL;  Surgeon: Diamantina Monks, MD;  Location: [facility] ENDOSCOPY;  Service: General;  Laterality: N/A;  FACIAL COSMETIC SURGERY Left 2002  "related to Bell's Palsy @ age 62; left eye/side of face droopy; tried to make area symmetrical"  FEMORAL-POPLITEAL BYPASS GRAFT Right 03/02/2015  Procedure: BYPASS GRAFT FEMORAL-BELOW KNEE POPLITEAL ARTERY;  Surgeon: Chuck Hint, MD;  Location: [facility] OR;  Service: Vascular;  Laterality: Right;  INGUINAL HERNIA REPAIR Bilateral 2002  IR ANGIO INTRA EXTRACRAN SEL COM CAROTID INNOMINATE UNI L MOD SED  11/16/2020  IR CT HEAD LTD  11/13/2020  IR PERCUTANEOUS ART THROMBECTOMY/INFUSION INTRACRANIAL INC DIAG ANGIO  11/13/2020  OVARIAN CYST REMOVAL Left   PEG PLACEMENT N/A 12/01/2020  Procedure: PERCUTANEOUS ENDOSCOPIC GASTROSTOMY (PEG) PLACEMENT;  Surgeon: Diamantina Monks, MD;  Location: [facility] ENDOSCOPY;  Service:  General;  Laterality: N/A;  PERIPHERAL VASCULAR CATHETERIZATION N/A 01/16/2015  Procedure: Abdominal Aortogram;  Surgeon: Chuck Hint, MD;  Location: [facility] INVASIVE CV LAB;  Service: Cardiovascular;  Laterality: N/A;  POSTERIOR LUMBAR FUSION  2015  "have plates and screws in there"  RADIOLOGY WITH ANESTHESIA N/A 11/13/2020  Procedure: IR WITH ANESTHESIA;  Surgeon: Radiologist, Medication, MD;  Location: [facility] OR;  Service: Radiology;  Laterality: N/A;  TONSILLECTOMY   HPI: Pt is a 75 y.o. female recently d/c to [facility] for rehab after admission 11/13/20-12/06/20 for multiple infarcts requiring revascularization and intubation, now readmitted 12/12/20 with respiratory distress. CXR noted increased R pleural effusion, unchanged L pleural effusion. Workup for sepsis secondary to PNA; question possibility of bacterial pneumonia versus aspiration with possibility of compounding factor of CHF exacerbation. 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 MBS 8/24 with dx of severe primarily oral dysphagia with adequate ability to protect airway with nectar-thick and puree - recommended therapeutic trials of puree/teaspoons of nectar with SLP>  Subjective: alert Assessment / Plan / Recommendation CHL IP CLINICAL IMPRESSIONS 12/21/2020 Clinical Impression Pt presents with overall improved oropharyngeal function since 8/24 exam. She demonstrates improved awareness and intentional manipulation of solids and liquids.  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  Thin liquids spilled prematurely into pharynx,  and on one instance there was trace aspiration - accompanied by a strong cough.  Subsequent thin liquid boluses were swallowed with laryngeal vestibule closure occurring in time to protect the airway.  There was good pharyngeal stripping with no post-swallow residue with any consistency.  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  Results of exam/video were reviewed with pt and her husband and with [name], the SLP who will be working with pt on CIR.  Recommend initiating a dysphagia 1 (pureed) diet with nectar thick liquids.  Pt may have sips of thin liquid outside of meals.  Give meds via PEG.  Dietitian to follow re: adjusting tube feedings. SLP should be able to advance diet based on clinical performance without repeating MBS.  SLP Visit Diagnosis Dysphagia, oropharyngeal phase (R13.12) Attention and concentration deficit following -- Frontal lobe and executive function deficit following -- Impact on safety and function Mild aspiration risk   CHL IP TREATMENT RECOMMENDATION 12/21/2020 Treatment Recommendations Defer treatment plan to f/u with SLP   Prognosis 12/13/2020 Prognosis for Safe Diet Advancement Guarded Barriers to Reach Goals Language deficits Barriers/Prognosis Comment -- CHL IP DIET RECOMMENDATION 12/21/2020 SLP Diet Recommendations Dysphagia 1 (Puree) solids;Nectar thick liquid Liquid Administration via Cup Medication Administration Via alternative means Compensations Minimize environmental distractions Postural Changes --   CHL IP OTHER RECOMMENDATIONS 12/21/2020 Recommended Consults -- Oral Care Recommendations Oral care BID Other Recommendations Have oral suction available   CHL IP FOLLOW UP RECOMMENDATIONS 12/21/2020 Follow up Recommendations Inpatient Rehab   CHL IP FREQUENCY AND DURATION 12/14/2020 Speech Therapy Frequency (ACUTE ONLY) min 2x/week Treatment Duration --      CHL IP ORAL PHASE 12/21/2020 Oral Phase Impaired Oral - Pudding Teaspoon -- Oral - Pudding Cup -- Oral - Honey Teaspoon -- Oral - Honey Cup -- Oral - Nectar Teaspoon -- Oral - Nectar Cup -- Oral - Nectar Straw --  Oral - Thin Teaspoon -- Oral - Thin Cup -- Oral - Thin Straw -- Oral - Puree -- Oral - Mech Soft -- Oral - Regular -- Oral - Multi-Consistency -- Oral - Pill -- Oral Phase - Comment --  CHL IP PHARYNGEAL PHASE 12/21/2020 Pharyngeal Phase Impaired Pharyngeal- Pudding Teaspoon -- Pharyngeal -- Pharyngeal- Pudding Cup -- Pharyngeal -- Pharyngeal- Honey Teaspoon -- Pharyngeal -- Pharyngeal- Honey Cup -- Pharyngeal -- Pharyngeal- Nectar Teaspoon Delayed swallow initiation-vallecula;Delayed swallow initiation-pyriform sinuses Pharyngeal Material enters airway, remains ABOVE vocal cords then ejected out Pharyngeal- Nectar Cup Delayed swallow initiation-vallecula;Delayed swallow initiation-pyriform sinuses Pharyngeal Material enters airway, remains ABOVE vocal cords then ejected out Pharyngeal- Nectar Straw -- Pharyngeal -- Pharyngeal- Thin Teaspoon -- Pharyngeal -- Pharyngeal- Thin Cup Delayed swallow initiation-pyriform sinuses;Penetration/Aspiration before swallow;Trace aspiration Pharyngeal Material enters airway, passes BELOW cords and not ejected out despite cough attempt by patient Pharyngeal- Thin Straw -- Pharyngeal -- Pharyngeal- Puree Delayed swallow initiation-vallecula;Delayed swallow initiation-pyriform sinuses Pharyngeal -- Pharyngeal- Mechanical Soft -- Pharyngeal -- Pharyngeal- Regular Delayed swallow initiation-vallecula;Delayed swallow initiation-pyriform sinuses Pharyngeal -- Pharyngeal- Multi-consistency -- Pharyngeal -- Pharyngeal- Pill -- Pharyngeal -- Pharyngeal Comment --  No flowsheet data found. [name] 12/21/2020, 2:08 PM               Assessment/Plan Left middle cerebral artery stroke (HCC) Continues to have severe Deficits Will continue working  with therapy Conitnue with Night Brace to prevent Contractures  Dysphagia, post-stroke Able to eat Dysphagia 2 with Nectar Liquid Continue to get her free fluid and Protein source from her tube  Pulmonary emphysema, unspecified  emphysema type (  HCC) On Nebs BID  Essential hypertension Continue on Lopressor  Paroxysmal atrial fibrillation (HCC) On Amiodarone and Eliquis also on Lopressor  Gastroesophageal reflux disease, unspecified whether esophagitis present Continue Protonix  Depression, recurrent (HCC) Mood stabilize with wellbutrin   Family/ staff Communication:   Labs/tests ordered:

## 2021-01-15 NOTE — Plan of Care (Signed)
  Problem: RH Balance Goal: LTG: Patient will maintain dynamic sitting balance (OT) Description: LTG:  Patient will maintain dynamic sitting balance with assistance during activities of daily living (OT) Outcome: Completed/Met Goal: LTG Patient will maintain dynamic standing with ADLs (OT) Description: LTG:  Patient will maintain dynamic standing balance with assist during activities of daily living (OT)  Outcome: Completed/Met   Problem: Sit to Stand Goal: LTG:  Patient will perform sit to stand in prep for activites of daily living with assistance level (OT) Description: LTG:  Patient will perform sit to stand in prep for activites of daily living with assistance level (OT) Outcome: Completed/Met   Problem: RH Grooming Goal: LTG Patient will perform grooming w/assist,cues/equip (OT) Description: LTG: Patient will perform grooming with assist, with/without cues using equipment (OT) Outcome: Completed/Met   Problem: RH Bathing Goal: LTG Patient will bathe all body parts with assist levels (OT) Description: LTG: Patient will bathe all body parts with assist levels (OT) Outcome: Completed/Met   Problem: RH Dressing Goal: LTG Patient will perform upper body dressing (OT) Description: LTG Patient will perform upper body dressing with assist, with/without cues (OT). Outcome: Completed/Met   Problem: RH Functional Use of Upper Extremity Goal: LTG Patient will use RT/LT upper extremity as a (OT) Description: LTG: Patient will use right/left upper extremity as a stabilizer/gross assist/diminished/nondominant/dominant level with assist, with/without cues during functional activity (OT) Outcome: Completed/Met   Problem: RH Toilet Transfers Goal: LTG Patient will perform toilet transfers w/assist (OT) Description: LTG: Patient will perform toilet transfers with assist, with/without cues using equipment (OT) Outcome: Completed/Met   Problem: RH Attention Goal: LTG Patient will demonstrate  this level of attention during functional activites (OT) Description: LTG:  Patient will demonstrate this level of attention during functional activites  (OT) Outcome: Completed/Met   Problem: RH Awareness Goal: LTG: Patient will demonstrate awareness during functional activites type of (OT) Description: LTG: Patient will demonstrate awareness during functional activites type of (OT) Outcome: Completed/Met   Problem: RH Eating Goal: LTG Patient will perform eating w/assist, cues/equip (OT) Description: LTG: Patient will perform eating with assist, with/without cues using equipment (OT) Outcome: Completed/Met

## 2021-01-15 NOTE — Progress Notes (Signed)
Occupational Therapy Discharge Summary  Patient Details  Name: Beverly Li MRN: [id number] Date of Birth: [birth year]  Today's Date: 01/15/2021 OT Individual Time: 0277-4128 OT Individual Time Calculation (min): 41 min   Pt greeted semi-reclined in bed with spouse present. Pt stated "yes" when asked if she needed to go to the bathroom. Mod A for bed mobility, then mod A stand-pivots bed>wc>BSC over toilet>wc. Pt had already been incontinent of urine and smear of BM, but no continent void in commode. Pt was able to complete front peri-care, but needed OT assist for posterior peri-care. Dressing task completed from wc at the sink with overall min/mod A. See functional navigator below for further details on BADL performance. Standing balance/endurance as well use R UE NMR with paper towel pushes on sink. Pt with trace shoulder activation. Pt left seated in wc at end of session with spouse present and needs met.    Patient has met 11 of 11 long term goals due to improved activity tolerance, improved balance, postural control, ability to compensate for deficits, functional use of  RIGHT upper and RIGHT lower extremity, improved attention, improved awareness, and improved coordination.  Patient to discharge at overall Mod Assist level.  Patient's care partner unavailable to provide the necessary physical and cognitive assistance at discharge so pt to d/c to SNF for continued OT in next venue of care.    Reasons goals not met: n/a  Recommendation:  Patient will benefit from ongoing skilled OT services in skilled nursing facility setting to continue to advance functional skills in the area of BADL and Reduce care partner burden.  Equipment: No equipment provided  Reasons for discharge: treatment goals met and discharge from hospital  Patient/family agrees with progress made and goals achieved: Yes  OT Discharge Precautions/Restrictions  Precautions Precautions: Fall Precaution Comments: R  hemiplegia with R shoulder subluxation, PEG, Restrictions Weight Bearing Restrictions: No Pain  Denies pain ADL ADL Eating: Set up Grooming: Setup Upper Body Bathing: Minimal assistance Lower Body Bathing: Moderate assistance Upper Body Dressing: Minimal assistance Lower Body Dressing: Moderate assistance Toileting: Moderate assistance Toilet Transfer: Moderate assistance Tub/Shower Transfer: Moderate assistance Perception  Perception: Impaired Praxis Praxis: Impaired Praxis Impairment Details: Initiation Cognition Overall Cognitive Status: Impaired/Different from baseline Arousal/Alertness: Awake/alert Orientation Level: Oriented to person;Oriented to place Year:  (UTA) Month: October Day of Week: Incorrect Focused Attention: Appears intact Sustained Attention: Appears intact Selective Attention: Impaired Memory: Impaired Immediate Memory Recall: Sock;Blue;Bed Memory Recall Sock: With Cue Memory Recall Blue: With Cue Memory Recall Bed: With Cue Problem Solving: Impaired Safety/Judgment: Impaired Sensation Sensation Light Touch: Appears Intact Additional Comments: Pt provides infrequent and inconsistent responses, but does react to deep pressure. Coordination Gross Motor Movements are Fluid and Coordinated: No Fine Motor Movements are Fluid and Coordinated: No Coordination and Movement Description: dense R hemiplegia Motor  Motor Motor: Hemiplegia Mobility  Bed Mobility Bed Mobility: Supine to Sit;Sit to Supine Supine to Sit: Moderate Assistance - Patient 50-74% Sit to Supine: Maximal Assistance - Patient 25-49% Transfers Sit to Stand: Moderate Assistance - Patient 50-74% Stand to Sit: Moderate Assistance - Patient 50-74%  Trunk/Postural Assessment  Cervical Assessment Cervical Assessment:  (forward head posture) Thoracic Assessment Thoracic Assessment:  (rounded shoulders) Lumbar Assessment Lumbar Assessment:  (posterior pelvic tilt) Postural  Control Postural Control: Deficits on evaluation Righting Reactions: delayed and inadequate Protective Responses: delayed and inadequate  Balance Balance Balance Assessed: Yes Static Sitting Balance Static Sitting - Balance Support: Feet supported Static Sitting - Level of Assistance:  5: Stand by assistance Dynamic Sitting Balance Dynamic Sitting - Balance Support: Feet supported Dynamic Sitting - Level of Assistance: 4: Min assist Sitting balance - Comments: UE support and min-modA to sit statically EOB. Static Standing Balance Static Standing - Balance Support: During functional activity Static Standing - Level of Assistance: 4: Min assist Dynamic Standing Balance Dynamic Standing - Balance Support: During functional activity Dynamic Standing - Level of Assistance: 3: Mod assist Extremity/Trunk Assessment RUE Assessment RUE Assessment: Exceptions to [facility] Passive Range of Motion (PROM) Comments: 0-- General Strength Comments: TRACE shoulder activation RUE Body System: Neuro Brunstrum levels for arm and hand: Arm;Hand Brunstrum level for arm: Stage I Presynergy Brunstrum level for hand: Stage I Flaccidity LUE Assessment LUE Assessment: Within Functional Limits General Strength Comments: generalized weakness   Daneen Schick Daxson Reffett 01/15/2021, 3:32 PM

## 2021-01-15 NOTE — Progress Notes (Signed)
PROGRESS NOTE   Subjective/Complaints:  Pt in good spirits. No new issues. Slept well. Husband has packed for SNF   Review of Systems  Unable to perform ROS: Language  no change  Objective:   No results found. Recent Labs    01/15/21 0603  WBC 8.8  HGB 15.3*  HCT 47.5*  PLT 234     Recent Labs    01/15/21 0603  NA 142  K 3.6  CL 104  CO2 29  GLUCOSE 93  BUN 26*  CREATININE 0.82  CALCIUM 9.1     Intake/Output Summary (Last 24 hours) at 01/15/2021 0909 Last data filed at 01/14/2021 1305 Gross per 24 hour  Intake 120 ml  Output --  Net 120 ml          Physical Exam: Vital Signs Blood pressure 112/86, pulse 66, temperature 98.6 F (37 C), resp. rate 14, height 5\' 3"  (1.6 m), weight 43.9 kg, SpO2 91 %.    Constitutional: No distress . Vital signs reviewed. HEENT: NCAT, EOMI, oral membranes moist Neck: supple Cardiovascular: RRR without murmur. No JVD    Respiratory/Chest: CTA Bilaterally without wheezes or rales. Normal effort    GI/Abdomen: BS +, non-tender, non-distended Ext: no clubbing, cyanosis, or edema Psych: pleasant and cooperative  Musc: No edema in extremities.  No tenderness in extremities. Neuro: Alert Exp>>rec, apraxic Dysarthria Motor: RUE 0/5, RLE 1/5 prox but inconsistently initiates No increase in tone appreciated  Assessment/Plan: 1. Functional deficits which require 3+ hours per day of interdisciplinary therapy in a comprehensive inpatient rehab setting. Physiatrist is providing close team supervision and 24 hour management of active medical problems listed below. Physiatrist and rehab team continue to assess barriers to discharge/monitor patient progress toward functional and medical goals  Care Tool:  Bathing    Body parts bathed by patient: Face, Left upper leg, Right upper leg, Abdomen, Chest, Right arm, Front perineal area   Body parts bathed by helper: Left  arm, Buttocks, Left lower leg, Right lower leg, Face     Bathing assist Assist Level: Moderate Assistance - Patient 50 - 74%     Upper Body Dressing/Undressing Upper body dressing   What is the patient wearing?: Pull over shirt    Upper body assist Assist Level: Moderate Assistance - Patient 50 - 74%    Lower Body Dressing/Undressing Lower body dressing      What is the patient wearing?: Pants     Lower body assist Assist for lower body dressing: Moderate Assistance - Patient 50 - 74%     Toileting Toileting    Toileting assist Assist for toileting: Total Assistance - Patient < 25%     Transfers Chair/bed transfer  Transfers assist     Chair/bed transfer assist level: Moderate Assistance - Patient 50 - 74%     Locomotion Ambulation   Ambulation assist      Assist level: Moderate Assistance - Patient 50 - 74% Assistive device: Walker-rolling (R hand splint) Max distance: 22'   Walk 10 feet activity   Assist     Assist level: Moderate Assistance - Patient - 50 - 74% Assistive device: Walker-rolling   Walk 50 feet  activity   Assist Walk 50 feet with 2 turns activity did not occur: Safety/medical concerns         Walk 150 feet activity   Assist Walk 150 feet activity did not occur: Safety/medical concerns         Walk 10 feet on uneven surface  activity   Assist Walk 10 feet on uneven surfaces activity did not occur: Safety/medical concerns         Wheelchair     Assist Is the patient using a wheelchair?: Yes Type of Wheelchair: Manual    Wheelchair assist level: Dependent - Patient 0% Max wheelchair distance: 150'    Wheelchair 50 feet with 2 turns activity    Assist        Assist Level: Dependent - Patient 0%   Wheelchair 150 feet activity     Assist      Assist Level: Dependent - Patient 0%   Blood pressure 112/86, pulse 66, temperature 98.6 F (37 C), resp. rate 14, height 5\' 3"  (1.6 m), weight  43.9 kg, SpO2 91 %. Medical Problem List and Plan: 1.  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  Antithrombotics: -DVT/anticoagulation: Eliquis   3. Pain Management: Tylenol as needed 4. Mood: Wellbutrin 75 mg twice daily              5.    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 12/03/2020-   -MBS demonstrated improvement in oropharyngeal function with apraxia  -D 2  nectars  -Advance diet as tolerated as outpt   -eating better 9.  Atrial fibrillation.  Eliquis.  Amiodarone 200 mg daily, Lopressor 12.5 mg twice daily.  Cardiac rate controlled at present 10.  Hyperlipidemia.  Zetia 11.  COPD. Stable -simplified regimen to  Pulmicort and prn duoneb--continue 12.  Acute on chronic diastolic congestive heart failure.     Filed Weights   01/12/21 0530 01/13/21 0559 01/14/21 0500  Weight: 43.7 kg 42.9 kg 43.9 kg   -monitor weights at SNF 13. HTN:   overall improved  Lopressor 50mg  bid    as needed hydralazine 14. Constipation   - stools soft--holding miralax, also holding senna-s at hs 15.  Elevated BUN  Free water flushes increased on 9/30  -BUN 26 10/3 LOS: 25 days A FACE TO FACE EVALUATION WAS PERFORMED  01/15/2021, 9:09 AM

## 2021-01-15 NOTE — Progress Notes (Signed)
Speech Language Pathology Note  Name: LENAE WHERLEY MRN: [id number] Date of Birth: 01-22-1946   Today's Date: 01/14/2021 SLP Individual Time: 0805-0847 SLP Individual Time Calculation (min): 42 min     Skilled Therapeutic Interventions:  Skilled ST services focused on swallow and communication skills. SLP facilitated oral care with suction toothbrush. Pt consumed thin liquids via 10cc provale cup x5 sips and via cup with only initial verbal cues for small sips with no overt s/s aspiration out of 3 trials.  SLP continued to advance thin liquid trials via provale cup and dys 2 textures, with initial instruction only to swallow solids piror to sips of liquids pt continued to demonstrate no s/s aspiration over 5 trials. SLP recommends for f/u ST to continue advancement of thin liquids with meals using provale cup. SLP facilitated use of word finding strategies naming family members given pictures as well as written aids, pt required mod A semantic and phonetic cues. Pt was left in room with call bell within reach and bed alarm set.  Srija Southard 01/15/2021, 8:02 AM

## 2021-01-16 ENCOUNTER — Encounter: Payer: Self-pay | Admitting: Internal Medicine

## 2021-01-18 ENCOUNTER — Encounter: Payer: Self-pay | Admitting: Adult Health

## 2021-01-18 ENCOUNTER — Non-Acute Institutional Stay (SKILLED_NURSING_FACILITY): Payer: Medicare Other | Admitting: Adult Health

## 2021-01-18 DIAGNOSIS — I69359 Hemiplegia and hemiparesis following cerebral infarction affecting unspecified side: Secondary | ICD-10-CM | POA: Diagnosis not present

## 2021-01-18 DIAGNOSIS — I10 Essential (primary) hypertension: Secondary | ICD-10-CM

## 2021-01-18 NOTE — Progress Notes (Signed)
Location:  [facility] Room Number: 116-A Place of Service:  SNF [phone]) Provider:  Fletcher Anon, NP   Patient Care Team: [name], MD as PCP - General (Internal Medicine) [name], MD as PCP - Cardiology (Cardiology) Duke Salvia, MD as PCP - Electrophysiology (Cardiology) Shirlean Kelly, MD as Consulting Physician (Neurosurgery)  Extended Emergency Contact Information Primary Emergency Contact: Golonka,[name] Address: [address]          [place] [phone] [name] Home Phone: [phone] Mobile Phone: [phone] Relation: Spouse Secondary Emergency Contact: Stainback,[name] Address: [address]          [place], [place] [address] [name] Mobile Phone: [phone] Relation: Son  Code Status:  Full Code  Goals of care: Advanced Directive information Advanced Directives 01/18/2021  Does Patient Have a Medical Advance Directive? Yes  Type of Advance Directive Living will  Does patient want to make changes to medical advance directive? No - Patient declined  Copy of Healthcare Power of Attorney in Chart? -  Would patient like information on creating a medical advance directive? -     Chief Complaint  Patient presents with   Acute Visit    Elevated blood pressure     HPI:  Pt is a 75 y.o. female seen today for an acute visit for hypertension. Nurse reports her bp has been elevated 176/104, 152/98. 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 She was admitted [facility] for continue PT/OT.  She denies any feelings of headache, pain, sob, dizziness, or change in vision.    Past Medical History:  Diagnosis Date   Anxiety    Arthritis    "some in my lower back; probably elbows, knees" (11/18/2017)   Atrial fibrillation (HCC)    Bell's palsy    when pt. was 75 yrs old, when under stress the left side of face will droop.    Complication of anesthesia    "vascular OR 2016; BP bottomed out; couldn't get it regulated; ended up in ICU for DAYS" (11/18/2017)   GERD (gastroesophageal reflux disease)    History of kidney stones    Hypertension    Hypertrophic cardiomyopathy (HCC)    severe LV basilar hypertrophy witn no evidence of significant outflow tract obstruction, EF 65-70%, mild LAE, mild TR, grade 1a diastolic dysfunction 05/15/10 (Dr. Donato Schultz) (Atrial Septal Hypertrophy pattern)-- Intra-op TEE with dsignificant outflow tract obstruction - AI, MR & TR   Insomnia    Mild aortic sclerosis    Osteopenia    Peripheral vascular disease (HCC)    Syncope    , Vagal   Past Surgical History:  Procedure Laterality Date   AUGMENTATION MAMMAPLASTY Bilateral    BACK SURGERY     CARDIAC CATHETERIZATION N/A 05/07/2016   Procedure: Left Heart Cath and Coronary Angiography;  Surgeon: Kathleene Hazel, MD;  Location: [facility] INVASIVE CV LAB;  Service: Cardiovascular;  Laterality: N/A;   CARDIOVERSION N/A 09/24/2017   Procedure: CARDIOVERSION;  Surgeon: Laurey Morale, MD;  Location: [facility] ENDOSCOPY;  Service: Cardiovascular;  Laterality: N/A;   DILATION AND CURETTAGE OF UTERUS     ENDARTERECTOMY FEMORAL Right 03/02/2015   Procedure: ENDARTERECTOMY RIGHT FEMORAL;  Surgeon: Chuck Hint, MD;  Location: [facility] OR;  Service: Vascular;  Laterality: Right;   ESOPHAGOGASTRODUODENOSCOPY (EGD) WITH PROPOFOL N/A 12/01/2020   Procedure: ESOPHAGOGASTRODUODENOSCOPY (EGD) WITH PROPOFOL;  Surgeon: Diamantina Monks,  MD;  Location: [facility] ENDOSCOPY;  Service: General;  Laterality: N/A;   FACIAL COSMETIC SURGERY Left 2002   "related to Bell's Palsy @ age 29; left eye/side of face droopy; tried to make area symmetrical"   FEMORAL-POPLITEAL BYPASS GRAFT Right 03/02/2015   Procedure: BYPASS GRAFT FEMORAL-BELOW KNEE POPLITEAL ARTERY;  Surgeon: Chuck Hint, MD;  Location: [facility] OR;  Service: Vascular;  Laterality: Right;   INGUINAL HERNIA  REPAIR Bilateral 2002   IR ANGIO INTRA EXTRACRAN SEL COM CAROTID INNOMINATE UNI L MOD SED  11/16/2020   IR CT HEAD LTD  11/13/2020   IR PERCUTANEOUS ART THROMBECTOMY/INFUSION INTRACRANIAL INC DIAG ANGIO  11/13/2020   OVARIAN CYST REMOVAL Left    PEG PLACEMENT N/A 12/01/2020   Procedure: PERCUTANEOUS ENDOSCOPIC GASTROSTOMY (PEG) PLACEMENT;  Surgeon: Diamantina Monks, MD;  Location: [facility] ENDOSCOPY;  Service: General;  Laterality: N/A;   PERIPHERAL VASCULAR CATHETERIZATION N/A 01/16/2015   Procedure: Abdominal Aortogram;  Surgeon: Chuck Hint, MD;  Location: [facility] INVASIVE CV LAB;  Service: Cardiovascular;  Laterality: N/A;   POSTERIOR LUMBAR FUSION  2015   "have plates and screws in there"   RADIOLOGY WITH ANESTHESIA N/A 11/13/2020   Procedure: IR WITH ANESTHESIA;  Surgeon: Radiologist, Medication, MD;  Location: [facility] OR;  Service: Radiology;  Laterality: N/A;   TONSILLECTOMY      Allergies  Allergen Reactions   Amoxicillin Other (See Comments)    UTI Has patient had a PCN reaction causing immediate rash, facial/tongue/throat swelling, SOB or lightheadedness with hypotension: No Has patient had a PCN reaction causing severe rash involving mucus membranes or skin necrosis: No Has patient had a PCN reaction that required hospitalization: No Has patient had a PCN reaction occurring within the last 10 years: Yes--UTI ONLY If all of the above answers are "NO", then may proceed with Cephalosporin use.    Atenolol Cough   Crestor [Rosuvastatin Calcium] Other (See Comments)    Muscle aches   Pravastatin Other (See Comments)    Muscle aches   Sulfa Antibiotics Nausea Only   Codeine     hallucinations    Outpatient Encounter Medications as of 01/18/2021  Medication Sig   acetaminophen (TYLENOL) 160 MG/5ML solution Place 20.3 mLs (650 mg total) into feeding tube every 4 (four) hours as needed for mild pain (or temp > 37.5 C (99.5 F)).   aluminum-magnesium hydroxide-simethicone (MAALOX) 200-200-20  MG/5ML SUSP Take 30 mLs by mouth every 4 (four) hours as needed (for heartburn up to 72 hours).   amiodarone (PACERONE) 200 MG tablet Place 1 tablet (200 mg total) into feeding tube daily.   apixaban (ELIQUIS) 5 MG TABS tablet Place 1 tablet (5 mg total) into feeding tube 2 (two) times daily.   budesonide (PULMICORT) 0.5 MG/2ML nebulizer solution Take 2 mLs (0.5 mg total) by nebulization 2 (two) times daily.   buPROPion (WELLBUTRIN) 75 MG tablet Place 1 tablet (75 mg total) into feeding tube 2 (two) times daily.   ezetimibe (ZETIA) 10 MG tablet Place 1 tablet (10 mg total) into feeding tube daily.   ipratropium-albuterol (DUONEB) 0.5-2.5 (3) MG/3ML SOLN Take 3 mLs by nebulization every 4 (four) hours as needed.   metoprolol tartrate (LOPRESSOR) 50 MG tablet Place 1 tablet (50 mg total) into feeding tube 2 (two) times daily.   pantoprazole sodium (PROTONIX) 40 mg Place 40 mg into feeding tube daily.   Water For Irrigation, Sterile (FREE WATER) SOLN Place 350 mLs into feeding tube 4 (four) times daily.   Water  For Irrigation, Sterile (FREE WATER) SOLN Place 250 mLs into feeding tube 6 (six) times daily.   No facility-administered encounter medications on file as of 01/18/2021.    Review of Systems  Constitutional:  Negative for activity change, appetite change, chills, diaphoresis, fatigue, fever and unexpected weight change.  HENT:  Negative for congestion.   Respiratory:  Negative for cough, shortness of breath and wheezing.   Cardiovascular:  Positive for leg swelling. Negative for chest pain and palpitations.  Gastrointestinal:  Negative for abdominal distention, abdominal pain, constipation and diarrhea.  Genitourinary:  Negative for difficulty urinating and dysuria.  Musculoskeletal:  Positive for gait problem. Negative for arthralgias, back pain, joint swelling and myalgias.  Neurological:  Positive for facial asymmetry, speech difficulty and weakness. Negative for dizziness, tremors,  seizures, syncope, light-headedness, numbness and headaches.  Psychiatric/Behavioral:  Positive for sleep disturbance. Negative for agitation, behavioral problems and confusion.    Immunization History  Administered Date(s) Administered   Influenza Split 01/16/2009, 04/30/2010, 01/26/2016, 02/07/2018, 02/01/2020   Influenza, High Dose Seasonal PF 02/07/2018, 01/25/2019   Influenza,inj,Quad PF,6+ Mos 03/06/2012, 01/26/2016, 01/10/2017   Influenza,inj,quad, With Preservative 02/14/2015   PFIZER(Purple Top)SARS-COV-2 Vaccination 05/06/2019, 05/27/2019, 01/27/2020, 09/18/2020   Pneumococcal Conjugate-13 03/19/2017   Pneumococcal Polysaccharide-23 02/14/2015   Td 02/25/2005   Tdap 05/19/2013   Zoster, Live 07/28/2006   Pertinent  Health Maintenance Due  Topic Date Due   COLONOSCOPY (Pts 45-55yrs Insurance coverage will need to be confirmed)  Never done   DEXA SCAN  Never done   INFLUENZA VACCINE  11/13/2020   Fall Risk  11/02/2018 09/03/2018  Falls in the past year? 0 1  Number falls in past yr: 0 0  Injury with Fall? 0 1  Risk for fall due to : - History of fall(s)  Follow up - Falls evaluation completed   Functional Status Survey:    Vitals:   01/18/21 1047  BP: 118/80  Pulse: 71  Resp: 20  Temp: (!) 97.5 F (36.4 C)  SpO2: 93%  Weight: 99 lb 1.6 oz (45 kg)  Height: 5\' 3"  (1.6 m)   Body mass index is 17.55 kg/m. Physical Exam Vitals and nursing note reviewed.  Constitutional:      General: She is not in acute distress.    Appearance: She is not diaphoretic.  HENT:     Head: Normocephalic and atraumatic.  Neck:     Vascular: No JVD.  Cardiovascular:     Rate and Rhythm: Normal rate and regular rhythm.     Heart sounds: No murmur heard. Pulmonary:     Effort: Pulmonary effort is normal. No respiratory distress.     Breath sounds: Normal breath sounds. No wheezing.  Abdominal:     General: Bowel sounds are normal. There is no distension.     Palpations: Abdomen  is soft.     Tenderness: There is no abdominal tenderness.  Musculoskeletal:        General: Swelling (right hand) present.     Right lower leg: Edema (+1) present.     Left lower leg: No edema.  Skin:    General: Skin is warm and dry.  Neurological:     Mental Status: She is alert.     Comments: Oriented to self and place. Right sided weakness.     Labs reviewed: Recent Labs    11/18/20 0100 11/19/20 0344 11/24/20 01/24/21 11/25/20 0038 11/29/20 0128 11/30/20 12/02/20 12/02/20 0216 12/04/20 12/06/20 01/08/21 01/10/21 01/12/21 0451 01/15/21 0603  NA  142   < > 146*   < > 151*   < > 140   < > 140 141 142  K 4.3   < > 3.7   < > 4.4   < > 4.2   < > 3.8 3.8 3.6  CL 111   < > 98   < > 113*   < > 108   < > 103 104 104  CO2 25   < > 40*   < > 30   < > 25   < > 28 27 29   GLUCOSE 154*   < > 124*   < > 140*   < > 181*   < > 84 92 93  BUN 32*   < > 46*   < > 60*   < > 51*   < > 27* 29* 26*  CREATININE 0.62   < > 0.81   < > 0.75   < > 0.79   < > 0.86 0.85 0.82  CALCIUM 8.5*   < > 9.2   < > 8.9   < > 8.4*   < > 9.4 9.1 9.1  MG  --   --  2.4  --  2.3  --  2.2  --   --   --   --   PHOS 2.1*  --   --   --  3.4  --  3.0  --   --   --   --    < > = values in this interval not displayed.   Recent Labs    11/13/20 1345 12/02/20 0216 12/12/20 1201 12/22/20 0409  AST 48*  --  35 35  ALT 25  --  48* 47*  ALKPHOS 60  --  83 69  BILITOT 1.7*  --  1.3* 0.6  PROT 5.7*  --  6.5 5.6*  ALBUMIN 3.1* 2.4* 2.9* 2.6*   Recent Labs    01/01/21 0713 01/08/21 0810 01/15/21 0603  WBC 6.1 7.8 8.8  NEUTROABS 3.7 4.8 5.5  HGB 14.9 16.6* 15.3*  HCT 46.5* 51.6* 47.5*  MCV 97.1 95.9 95.2  PLT PLATELET CLUMPS NOTED ON SMEAR, UNABLE TO ESTIMATE 283 234   Lab Results  Component Value Date   TSH 1.087 11/18/2020   Lab Results  Component Value Date   HGBA1C 6.0 (H) 11/13/2020   Lab Results  Component Value Date   CHOL 209 (H) 11/13/2020   HDL 70 11/13/2020   LDLCALC 120 (H) 11/13/2020   TRIG 97 11/13/2020    CHOLHDL 3.0 11/13/2020    Significant Diagnostic Results in last 30 days:  DG Swallowing Func-Speech Pathology  Result Date: 12/21/2020 Table formatting from the original result was not included. Objective Swallowing Evaluation: Type of Study: MBS-Modified Barium Swallow Study  Patient Details Name: Beverly Li MRN: [id number] Date of Birth: [birth year] Today's Date: 12/21/2020 Time: SLP Start Time (ACUTE ONLY): 1250 -SLP Stop Time (ACUTE ONLY): 1310 SLP Time Calculation (min) (ACUTE ONLY): 20 min Past Medical History: Past Medical History: Diagnosis Date  Anxiety   Arthritis   "some in my lower back; probably elbows, knees" (11/18/2017)  Atrial fibrillation (HCC)   Bell's palsy   when pt. was 75 yrs old, when under stress the left side of face will droop.  Complication of anesthesia   "vascular OR 2016; BP bottomed out; couldn't get it regulated; ended up in ICU for DAYS" (11/18/2017)  GERD (gastroesophageal reflux disease)   History of kidney stones  Hypertension   Hypertrophic cardiomyopathy (HCC)   severe LV basilar hypertrophy witn no evidence of significant outflow tract obstruction, EF 65-70%, mild LAE, mild TR, grade 1a diastolic dysfunction 05/15/10 (Dr. Donato Schultz) (Atrial Septal Hypertrophy pattern)-- Intra-op TEE with dsignificant outflow tract obstruction - AI, MR & TR  Insomnia   Mild aortic sclerosis   Osteopenia   Peripheral vascular disease (HCC)   Syncope   , Vagal Past Surgical History: Past Surgical History: Procedure Laterality Date  AUGMENTATION MAMMAPLASTY Bilateral   BACK SURGERY    CARDIAC CATHETERIZATION N/A 05/07/2016  Procedure: Left Heart Cath and Coronary Angiography;  Surgeon: Kathleene Hazel, MD;  Location: [facility] INVASIVE CV LAB;  Service: Cardiovascular;  Laterality: N/A;  CARDIOVERSION N/A 09/24/2017  Procedure: CARDIOVERSION;  Surgeon: Laurey Morale, MD;  Location: [facility] ENDOSCOPY;  Service: Cardiovascular;  Laterality: N/A;  DILATION AND CURETTAGE OF UTERUS    ENDARTERECTOMY  FEMORAL Right 03/02/2015  Procedure: ENDARTERECTOMY RIGHT FEMORAL;  Surgeon: Chuck Hint, MD;  Location: [facility] OR;  Service: Vascular;  Laterality: Right;  ESOPHAGOGASTRODUODENOSCOPY (EGD) WITH PROPOFOL N/A 12/01/2020  Procedure: ESOPHAGOGASTRODUODENOSCOPY (EGD) WITH PROPOFOL;  Surgeon: Diamantina Monks, MD;  Location: [facility] ENDOSCOPY;  Service: General;  Laterality: N/A;  FACIAL COSMETIC SURGERY Left 2002  "related to Bell's Palsy @ age 20; left eye/side of face droopy; tried to make area symmetrical"  FEMORAL-POPLITEAL BYPASS GRAFT Right 03/02/2015  Procedure: BYPASS GRAFT FEMORAL-BELOW KNEE POPLITEAL ARTERY;  Surgeon: Chuck Hint, MD;  Location: [facility] OR;  Service: Vascular;  Laterality: Right;  INGUINAL HERNIA REPAIR Bilateral 2002  IR ANGIO INTRA EXTRACRAN SEL COM CAROTID INNOMINATE UNI L MOD SED  11/16/2020  IR CT HEAD LTD  11/13/2020  IR PERCUTANEOUS ART THROMBECTOMY/INFUSION INTRACRANIAL INC DIAG ANGIO  11/13/2020  OVARIAN CYST REMOVAL Left   PEG PLACEMENT N/A 12/01/2020  Procedure: PERCUTANEOUS ENDOSCOPIC GASTROSTOMY (PEG) PLACEMENT;  Surgeon: Diamantina Monks, MD;  Location: [facility] ENDOSCOPY;  Service: General;  Laterality: N/A;  PERIPHERAL VASCULAR CATHETERIZATION N/A 01/16/2015  Procedure: Abdominal Aortogram;  Surgeon: Chuck Hint, MD;  Location: [facility] INVASIVE CV LAB;  Service: Cardiovascular;  Laterality: N/A;  POSTERIOR LUMBAR FUSION  2015  "have plates and screws in there"  RADIOLOGY WITH ANESTHESIA N/A 11/13/2020  Procedure: IR WITH ANESTHESIA;  Surgeon: Radiologist, Medication, MD;  Location: [facility] OR;  Service: Radiology;  Laterality: N/A;  TONSILLECTOMY   HPI: Pt is a 75 y.o. female recently d/c to [facility] for rehab after admission 11/13/20-12/06/20 for multiple infarcts requiring revascularization and intubation, now readmitted 12/12/20 with respiratory distress. CXR noted increased R pleural effusion, unchanged L pleural effusion. Workup for sepsis secondary to PNA; question possibility of  bacterial pneumonia versus aspiration with possibility of compounding factor of CHF exacerbation. 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 MBS 8/24 with dx of severe primarily oral dysphagia with adequate ability to protect airway with nectar-thick and puree - recommended therapeutic trials of puree/teaspoons of nectar with SLP>  Subjective: alert Assessment / Plan / Recommendation CHL IP CLINICAL IMPRESSIONS 12/21/2020 Clinical Impression Pt presents with overall improved oropharyngeal function since 8/24 exam. She demonstrates improved awareness and intentional manipulation of solids and liquids.  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  Thin liquids spilled prematurely into pharynx, and on one instance there was trace aspiration - accompanied by a strong cough. Subsequent thin liquid boluses were swallowed with laryngeal vestibule closure occurring in time to protect the airway.  There was good pharyngeal stripping with no post-swallow residue with any consistency.  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  Results of exam/video were reviewed with pt and her husband and with [name], the SLP who will be working with pt on CIR.  Recommend initiating a dysphagia 1 (pureed) diet with nectar thick liquids.  Pt may have sips of thin liquid outside of meals.  Give meds via PEG.  Dietitian to follow re: adjusting tube feedings. SLP should be able to advance diet based on clinical performance without repeating MBS.  SLP Visit Diagnosis Dysphagia, oropharyngeal phase (R13.12) Attention and concentration deficit following -- Frontal lobe and executive function deficit  following -- Impact on safety and function Mild aspiration risk   CHL IP TREATMENT RECOMMENDATION 12/21/2020 Treatment Recommendations Defer treatment plan to f/u with SLP   Prognosis 12/13/2020 Prognosis for Safe Diet Advancement Guarded Barriers to Reach Goals Language deficits Barriers/Prognosis Comment -- CHL IP DIET RECOMMENDATION 12/21/2020 SLP Diet Recommendations Dysphagia 1 (Puree) solids;Nectar thick liquid Liquid Administration via Cup Medication Administration Via alternative means Compensations Minimize environmental distractions Postural Changes --   CHL IP OTHER RECOMMENDATIONS 12/21/2020 Recommended Consults -- Oral Care Recommendations Oral care BID Other Recommendations Have oral suction available   CHL IP FOLLOW UP RECOMMENDATIONS 12/21/2020 Follow up Recommendations Inpatient Rehab   CHL IP FREQUENCY AND DURATION 12/14/2020 Speech Therapy Frequency (ACUTE ONLY) min 2x/week Treatment Duration --      CHL IP ORAL PHASE 12/21/2020 Oral Phase Impaired Oral - Pudding Teaspoon -- Oral - Pudding Cup -- Oral - Honey Teaspoon -- Oral - Honey Cup -- Oral - Nectar Teaspoon -- Oral - Nectar Cup -- Oral - Nectar Straw -- Oral - Thin Teaspoon -- Oral - Thin Cup -- Oral - Thin Straw -- Oral - Puree -- Oral - Mech Soft -- Oral - Regular -- Oral - Multi-Consistency -- Oral - Pill -- Oral Phase - Comment --  CHL IP PHARYNGEAL PHASE 12/21/2020 Pharyngeal Phase Impaired Pharyngeal- Pudding Teaspoon -- Pharyngeal -- Pharyngeal- Pudding Cup -- Pharyngeal -- Pharyngeal- Honey Teaspoon -- Pharyngeal -- Pharyngeal- Honey Cup -- Pharyngeal -- Pharyngeal- Nectar Teaspoon Delayed swallow initiation-vallecula;Delayed swallow initiation-pyriform sinuses Pharyngeal Material enters airway, remains ABOVE vocal cords then ejected out Pharyngeal- Nectar Cup Delayed swallow initiation-vallecula;Delayed swallow initiation-pyriform sinuses Pharyngeal Material enters airway, remains ABOVE vocal cords then ejected out Pharyngeal- Nectar Straw --  Pharyngeal -- Pharyngeal- Thin Teaspoon -- Pharyngeal -- Pharyngeal- Thin Cup Delayed swallow initiation-pyriform sinuses;Penetration/Aspiration before swallow;Trace aspiration Pharyngeal Material enters airway, passes BELOW cords and not ejected out despite cough attempt by patient Pharyngeal- Thin Straw -- Pharyngeal -- Pharyngeal- Puree Delayed swallow initiation-vallecula;Delayed swallow initiation-pyriform sinuses Pharyngeal -- Pharyngeal- Mechanical Soft -- Pharyngeal -- Pharyngeal- Regular Delayed swallow initiation-vallecula;Delayed swallow initiation-pyriform sinuses Pharyngeal -- Pharyngeal- Multi-consistency -- Pharyngeal -- Pharyngeal- Pill -- Pharyngeal -- Pharyngeal Comment --  No flowsheet data found. [name] 12/21/2020, 2:08 PM               Assessment/Plan  1. Essential hypertension Diastolic elevated Her husband reports they used prn hydralazine in the past with benefit Will add 10 mg tid prn SBP >165 diastolic >95  2. 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    Family/ staff Communication: resident and her husband   Labs/tests ordered:  NA

## 2021-01-22 ENCOUNTER — Observation Stay (HOSPITAL_COMMUNITY): Payer: Medicare Other

## 2021-01-22 ENCOUNTER — Emergency Department (HOSPITAL_COMMUNITY): Payer: Medicare Other

## 2021-01-22 ENCOUNTER — Encounter (HOSPITAL_COMMUNITY): Payer: Self-pay | Admitting: Internal Medicine

## 2021-01-22 ENCOUNTER — Observation Stay (HOSPITAL_COMMUNITY)
Admission: EM | Admit: 2021-01-22 | Discharge: 2021-01-23 | Disposition: A | Discharge status: Home / Self Care | Payer: Medicare Other | Attending: Internal Medicine | Admitting: Internal Medicine

## 2021-01-22 DIAGNOSIS — I4819 Other persistent atrial fibrillation: Secondary | ICD-10-CM | POA: Diagnosis not present

## 2021-01-22 DIAGNOSIS — Z885 Allergy status to narcotic agent status: Secondary | ICD-10-CM | POA: Insufficient documentation

## 2021-01-22 DIAGNOSIS — R0789 Other chest pain: Secondary | ICD-10-CM | POA: Diagnosis not present

## 2021-01-22 DIAGNOSIS — G4489 Other headache syndrome: Secondary | ICD-10-CM | POA: Diagnosis not present

## 2021-01-22 DIAGNOSIS — I63312 Cerebral infarction due to thrombosis of left middle cerebral artery: Secondary | ICD-10-CM | POA: Diagnosis not present

## 2021-01-22 DIAGNOSIS — R29818 Other symptoms and signs involving the nervous system: Secondary | ICD-10-CM | POA: Diagnosis not present

## 2021-01-22 DIAGNOSIS — Z20822 Contact with and (suspected) exposure to covid-19: Secondary | ICD-10-CM | POA: Insufficient documentation

## 2021-01-22 DIAGNOSIS — Z7901 Long term (current) use of anticoagulants: Secondary | ICD-10-CM | POA: Diagnosis not present

## 2021-01-22 DIAGNOSIS — I6523 Occlusion and stenosis of bilateral carotid arteries: Secondary | ICD-10-CM | POA: Insufficient documentation

## 2021-01-22 DIAGNOSIS — J432 Centrilobular emphysema: Secondary | ICD-10-CM | POA: Insufficient documentation

## 2021-01-22 DIAGNOSIS — I10 Essential (primary) hypertension: Secondary | ICD-10-CM | POA: Diagnosis present

## 2021-01-22 DIAGNOSIS — Z79899 Other long term (current) drug therapy: Secondary | ICD-10-CM | POA: Insufficient documentation

## 2021-01-22 DIAGNOSIS — I639 Cerebral infarction, unspecified: Secondary | ICD-10-CM | POA: Insufficient documentation

## 2021-01-22 DIAGNOSIS — I69391 Dysphagia following cerebral infarction: Secondary | ICD-10-CM

## 2021-01-22 DIAGNOSIS — I6789 Other cerebrovascular disease: Secondary | ICD-10-CM | POA: Insufficient documentation

## 2021-01-22 DIAGNOSIS — Z743 Need for continuous supervision: Secondary | ICD-10-CM | POA: Diagnosis not present

## 2021-01-22 DIAGNOSIS — Z8673 Personal history of transient ischemic attack (TIA), and cerebral infarction without residual deficits: Secondary | ICD-10-CM | POA: Diagnosis not present

## 2021-01-22 DIAGNOSIS — J3489 Other specified disorders of nose and nasal sinuses: Secondary | ICD-10-CM | POA: Diagnosis not present

## 2021-01-22 DIAGNOSIS — I6522 Occlusion and stenosis of left carotid artery: Secondary | ICD-10-CM | POA: Diagnosis not present

## 2021-01-22 DIAGNOSIS — I700 Atherosclerosis of aorta: Secondary | ICD-10-CM | POA: Diagnosis not present

## 2021-01-22 DIAGNOSIS — R6889 Other general symptoms and signs: Secondary | ICD-10-CM | POA: Diagnosis not present

## 2021-01-22 DIAGNOSIS — R471 Dysarthria and anarthria: Secondary | ICD-10-CM | POA: Diagnosis present

## 2021-01-22 DIAGNOSIS — I69351 Hemiplegia and hemiparesis following cerebral infarction affecting right dominant side: Secondary | ICD-10-CM | POA: Diagnosis not present

## 2021-01-22 DIAGNOSIS — R4701 Aphasia: Secondary | ICD-10-CM | POA: Insufficient documentation

## 2021-01-22 DIAGNOSIS — R9431 Abnormal electrocardiogram [ECG] [EKG]: Secondary | ICD-10-CM

## 2021-01-22 DIAGNOSIS — Z880 Allergy status to penicillin: Secondary | ICD-10-CM | POA: Diagnosis not present

## 2021-01-22 DIAGNOSIS — R2990 Unspecified symptoms and signs involving the nervous system: Secondary | ICD-10-CM

## 2021-01-22 DIAGNOSIS — E785 Hyperlipidemia, unspecified: Secondary | ICD-10-CM | POA: Diagnosis not present

## 2021-01-22 DIAGNOSIS — I421 Obstructive hypertrophic cardiomyopathy: Secondary | ICD-10-CM | POA: Diagnosis not present

## 2021-01-22 DIAGNOSIS — I63512 Cerebral infarction due to unspecified occlusion or stenosis of left middle cerebral artery: Secondary | ICD-10-CM | POA: Diagnosis not present

## 2021-01-22 DIAGNOSIS — Z882 Allergy status to sulfonamides status: Secondary | ICD-10-CM | POA: Diagnosis not present

## 2021-01-22 DIAGNOSIS — I480 Paroxysmal atrial fibrillation: Secondary | ICD-10-CM | POA: Diagnosis present

## 2021-01-22 DIAGNOSIS — I739 Peripheral vascular disease, unspecified: Secondary | ICD-10-CM | POA: Diagnosis not present

## 2021-01-22 DIAGNOSIS — I4891 Unspecified atrial fibrillation: Secondary | ICD-10-CM | POA: Diagnosis not present

## 2021-01-22 DIAGNOSIS — I6503 Occlusion and stenosis of bilateral vertebral arteries: Secondary | ICD-10-CM | POA: Diagnosis not present

## 2021-01-22 DIAGNOSIS — R079 Chest pain, unspecified: Secondary | ICD-10-CM | POA: Diagnosis not present

## 2021-01-22 DIAGNOSIS — Z87891 Personal history of nicotine dependence: Secondary | ICD-10-CM | POA: Diagnosis not present

## 2021-01-22 DIAGNOSIS — Z8249 Family history of ischemic heart disease and other diseases of the circulatory system: Secondary | ICD-10-CM | POA: Insufficient documentation

## 2021-01-22 DIAGNOSIS — J439 Emphysema, unspecified: Secondary | ICD-10-CM | POA: Insufficient documentation

## 2021-01-22 LAB — COMPREHENSIVE METABOLIC PANEL
ALT: 38 U/L (ref 0–44)
AST: 39 U/L (ref 15–41)
Albumin: 3.4 g/dL — ABNORMAL LOW (ref 3.5–5.0)
Alkaline Phosphatase: 68 U/L (ref 38–126)
Anion gap: 8 (ref 5–15)
BUN: 16 mg/dL (ref 8–23)
CO2: 33 mmol/L — ABNORMAL HIGH (ref 22–32)
Calcium: 9 mg/dL (ref 8.9–10.3)
Chloride: 98 mmol/L (ref 98–111)
Creatinine, Ser: 0.98 mg/dL (ref 0.44–1.00)
GFR, Estimated: >60 mL/min (ref >60)
Glucose, Bld: 104 mg/dL — ABNORMAL HIGH (ref 70–99)
Potassium: 4.3 mmol/L (ref 3.5–5.1)
Sodium: 139 mmol/L (ref 135–145)
Total Bilirubin: 1.3 mg/dL — ABNORMAL HIGH (ref 0.3–1.2)
Total Protein: 6.1 g/dL — ABNORMAL LOW (ref 6.5–8.1)

## 2021-01-22 LAB — CBC
HCT: 48.4 % — ABNORMAL HIGH (ref 36.0–46.0)
Hemoglobin: 15.5 g/dL — ABNORMAL HIGH (ref 12.0–15.0)
MCH: 30.9 pg (ref 26.0–34.0)
MCHC: 32 g/dL (ref 30.0–36.0)
MCV: 96.4 fL (ref 80.0–100.0)
Platelets: 237 10*3/uL (ref 150–400)
RBC: 5.02 MIL/uL (ref 3.87–5.11)
RDW: 14.9 % (ref 11.5–15.5)
WBC: 10.7 10*3/uL — ABNORMAL HIGH (ref 4.0–10.5)
nRBC: 0 % (ref 0.0–0.2)

## 2021-01-22 LAB — I-STAT CHEM 8, ED
BUN: 19 mg/dL (ref 8–23)
Calcium, Ion: 1.06 mmol/L — ABNORMAL LOW (ref 1.15–1.40)
Chloride: 98 mmol/L (ref 98–111)
Creatinine, Ser: 0.9 mg/dL (ref 0.44–1.00)
Glucose, Bld: 106 mg/dL — ABNORMAL HIGH (ref 70–99)
HCT: 49 % — ABNORMAL HIGH (ref 36.0–46.0)
Hemoglobin: 16.7 g/dL — ABNORMAL HIGH (ref 12.0–15.0)
Potassium: 3.9 mmol/L (ref 3.5–5.1)
Sodium: 138 mmol/L (ref 135–145)
TCO2: 33 mmol/L — ABNORMAL HIGH (ref 22–32)

## 2021-01-22 LAB — TROPONIN I (HIGH SENSITIVITY)
Troponin I (High Sensitivity): 132 ng/L (ref <18)
Troponin I (High Sensitivity): 127 ng/L (ref <18)

## 2021-01-22 LAB — DIFFERENTIAL
Abs Immature Granulocytes: 0.06 10*3/uL (ref 0.00–0.07)
Basophils Absolute: 0 10*3/uL (ref 0.0–0.1)
Basophils Relative: 0 %
Eosinophils Absolute: 0.1 10*3/uL (ref 0.0–0.5)
Eosinophils Relative: 1 %
Immature Granulocytes: 1 %
Lymphocytes Relative: 18 %
Lymphs Abs: 2 10*3/uL (ref 0.7–4.0)
Monocytes Absolute: 0.8 10*3/uL (ref 0.1–1.0)
Monocytes Relative: 8 %
Neutro Abs: 7.7 10*3/uL (ref 1.7–7.7)
Neutrophils Relative %: 72 %

## 2021-01-22 LAB — RESP PANEL BY RT-PCR (FLU A&B, COVID) ARPGX2
Influenza A by PCR: NEGATIVE
Influenza B by PCR: NEGATIVE
SARS Coronavirus 2 by RT PCR: NEGATIVE

## 2021-01-22 LAB — PROTIME-INR
INR: 1.2 (ref 0.8–1.2)
Prothrombin Time: 15.2 seconds (ref 11.4–15.2)

## 2021-01-22 LAB — APTT: aPTT: 31 seconds (ref 24–36)

## 2021-01-22 LAB — CBG MONITORING, ED: Glucose-Capillary: 110 mg/dL — ABNORMAL HIGH (ref 70–99)

## 2021-01-22 MED ORDER — ACETAMINOPHEN 160 MG/5ML PO SOLN
1000.0000 mg | Freq: Four times a day (QID) | ORAL | Status: DC | PRN
  Administered 2021-01-22 (×2): 1000 mg
  Filled 2021-01-22: qty 40.6

## 2021-01-22 MED ORDER — METOPROLOL TARTRATE 50 MG PO TABS
50.0000 mg | ORAL_TABLET | Freq: Two times a day (BID) | ORAL | Status: DC
  Administered 2021-01-22 – 2021-01-23 (×4): 50 mg
  Filled 2021-01-22: qty 1
  Filled 2021-01-22: qty 2
  Filled 2021-01-22 (×2): qty 1

## 2021-01-22 MED ORDER — ALUM & MAG HYDROXIDE-SIMETH 200-200-20 MG/5ML PO SUSP: 30.0000 mL | ORAL | Status: DC | PRN

## 2021-01-22 MED ORDER — SODIUM CHLORIDE 0.9% FLUSH
3.0000 mL | Freq: Once | INTRAVENOUS | Status: AC
Start: 2021-01-22 — End: 2021-01-22
  Administered 2021-01-22: 3 mL via INTRAVENOUS

## 2021-01-22 MED ORDER — APIXABAN 5 MG PO TABS
5.0000 mg | ORAL_TABLET | Freq: Two times a day (BID) | ORAL | Status: DC
  Administered 2021-01-22 – 2021-01-23 (×4): 5 mg
  Filled 2021-01-22 (×4): qty 1

## 2021-01-22 MED ORDER — POLYETHYLENE GLYCOL 3350 17 G PO PACK: 17.0000 g | PACK | Freq: Every day | ORAL | Status: DC | PRN

## 2021-01-22 MED ORDER — EZETIMIBE 10 MG PO TABS
10.0000 mg | ORAL_TABLET | Freq: Every day | ORAL | Status: DC
  Administered 2021-01-22 – 2021-01-23 (×2): 10 mg
  Filled 2021-01-22 (×2): qty 1

## 2021-01-22 MED ORDER — BUDESONIDE 0.5 MG/2ML IN SUSP
0.5000 mg | Freq: Two times a day (BID) | RESPIRATORY_TRACT | Status: DC
  Administered 2021-01-22 – 2021-01-23 (×4): 0.5 mg via RESPIRATORY_TRACT
  Filled 2021-01-22 (×4): qty 2

## 2021-01-22 MED ORDER — IOHEXOL 350 MG/ML SOLN
100.0000 mL | Freq: Once | INTRAVENOUS | Status: AC | PRN
  Administered 2021-01-22: 100 mL via INTRAVENOUS

## 2021-01-22 MED ORDER — ACETAMINOPHEN 160 MG/5ML PO SOLN
650.0000 mg | ORAL | Status: DC | PRN
  Filled 2021-01-22: qty 20.3

## 2021-01-22 MED ORDER — FREE WATER
250.0000 mL | Status: DC
  Administered 2021-01-22 – 2021-01-23 (×9): 250 mL

## 2021-01-22 MED ORDER — FREE WATER: 350.0000 mL | Freq: Four times a day (QID) | Status: DC

## 2021-01-22 MED ORDER — IPRATROPIUM-ALBUTEROL 0.5-2.5 (3) MG/3ML IN SOLN: 3.0000 mL | RESPIRATORY_TRACT | Status: DC | PRN

## 2021-01-22 MED ORDER — LABETALOL HCL 5 MG/ML IV SOLN
5.0000 mg | INTRAVENOUS | Status: DC | PRN
  Administered 2021-01-22 – 2021-01-23 (×2): 5 mg via INTRAVENOUS
  Filled 2021-01-22 (×2): qty 4

## 2021-01-22 MED ORDER — BUPROPION HCL 75 MG PO TABS
75.0000 mg | ORAL_TABLET | Freq: Two times a day (BID) | ORAL | Status: DC
  Administered 2021-01-22 – 2021-01-23 (×4): 75 mg
  Filled 2021-01-22 (×5): qty 1

## 2021-01-22 MED ORDER — AMIODARONE HCL 200 MG PO TABS
200.0000 mg | ORAL_TABLET | Freq: Every day | ORAL | Status: DC
  Administered 2021-01-22 – 2021-01-23 (×2): 200 mg
  Filled 2021-01-22 (×2): qty 1

## 2021-01-22 MED ORDER — PANTOPRAZOLE 2 MG/ML SUSPENSION
40.0000 mg | Freq: Two times a day (BID) | ORAL | Status: DC
  Administered 2021-01-22 – 2021-01-23 (×4): 40 mg
  Filled 2021-01-22 (×6): qty 20

## 2021-01-22 MED ORDER — STROKE: EARLY STAGES OF RECOVERY BOOK: Freq: Once | Status: DC

## 2021-01-22 NOTE — Progress Notes (Signed)
STROKE TEAM PROGRESS NOTE   INTERVAL HISTORY Husband at bedside.  Brought back to the hospital via EMS early today from facility due to acute onset of left sided facial droop and aphasia with baseline of right sided weakness and communication deficit from recent stroke in August.   Today she is alert, and able to state name. Perseverates on "51" in answering other orientation questions. Following commands.  Eliquis was being administered by facility staff. No reported issues with taking it. Had progressed from PEG tube feedings to oral feedings and medications at the SNF prior to admission per husband. Her stroke diagnosis and ongoing work up with plan of care was explained. Husband's questions were answered.   Vitals:   01/22/21 1115 01/22/21 1130 01/22/21 1145 01/22/21 1200  BP: (!) 170/117 (!) 177/103 (!) 180/116 (!) 160/100  Pulse: 78 85 77 80  Resp: 16 15 18  (!) 24  Temp:      TempSrc:      SpO2: 95% 96% 97% 98%   CBC:  Recent Labs  Lab 01/22/21 0651 01/22/21 0658  WBC 10.7*  --   NEUTROABS 7.7  --   HGB 15.5* 16.7*  HCT 48.4* 49.0*  MCV 96.4  --   PLT 237  --    Basic Metabolic Panel:  Recent Labs  Lab 01/22/21 0651 01/22/21 0658  NA 139 138  K 4.3 3.9  CL 98 98  CO2 33*  --   GLUCOSE 104* 106*  BUN 16 19  CREATININE 0.98 0.90  CALCIUM 9.0  --    Lipid Panel: No results for input(s): CHOL, TRIG, HDL, CHOLHDL, VLDL, LDLCALC in the last 168 hours. HgbA1c: No results for input(s): HGBA1C in the last 168 hours. Urine Drug Screen: No results for input(s): LABOPIA, COCAINSCRNUR, LABBENZ, AMPHETMU, THCU, LABBARB in the last 168 hours.  Alcohol Level No results for input(s): ETH in the last 168 hours.  IMAGING past 24 hours MR BRAIN WO CONTRAST  Result Date: 01/22/2021 CLINICAL DATA:  Transient ischemic attack EXAM: MRI HEAD WITHOUT CONTRAST TECHNIQUE: Multiplanar, multiecho pulse sequences of the brain and surrounding structures were obtained without intravenous  contrast. COMPARISON:  Same day CT/CTA head and neck, MR head 11/14/2020 FINDINGS: Brain: There is diffusion restriction with associated T2/FLAIR signal abnormality in the left ACA and MCA distribution, some of which was present on the prior study from 11/14/2020 and has decreased in conspicuity; however, there is increased diffusion restriction in the left basal ganglia, internal and external capsules, extending to the caudate body/corona radiata. Findings suspicious for acute infarct superimposed on evolving subacute infarcts. Curvilinear SWI signal dropout along the left precentral sulcus may reflect petechial hemorrhage. There is an additional focus of SWI signal dropout in the left superior frontal gyrus which may reflect additional petechial hemorrhage versus punctate chronic microhemorrhage. Vascular: The right ICA flow void is absent through the communicating segment, in keeping with the findings on the prior CTA neck. Skull and upper cervical spine: Normal marrow signal. Sinuses/Orbits: There is mild mucosal thickening in the paranasal sinuses. The globes and orbits are unremarkable. Other: None. IMPRESSION: 1. New/increased diffusion restriction in the left basal ganglia, internal and external capsules, extending to the caudate body/corona radiata compared to the study from 11/14/2020 consistent with acute to subacute infarct. 2. Additional diffusion restriction in the left MCA and ACA territories consistent with evolving subacute infarct as seen on the study from 11/14/2020. 3. Curvilinear SWI signal dropout along the left precentral sulcus consistent with petechial hemorrhage.  There is an additional focus of petechial hemorrhage versus punctate chronic microhemorrhage in the left superior frontal gyrus. Electronically Signed   By: Lesia Hausen M.D.   On: 01/22/2021 10:20   CT HEAD CODE STROKE WO CONTRAST  Result Date: 01/22/2021 CLINICAL DATA:  Code stroke. 75 year old female with neurologic deficit.  Left ACA and MCA territory infarcts in August. EXAM: CT HEAD WITHOUT CONTRAST TECHNIQUE: Contiguous axial images were obtained from the base of the skull through the vertex without intravenous contrast. COMPARISON:  Brain MRI 11/14/2020, head CT 11/13/2020. FINDINGS: Brain: Hypodensity in the left MCA and ACA territories corresponding to restricted diffusion last month, resembling developing encephalomalacia. No new areas of cytotoxic edema identified. Heterogeneity in the right deep gray matter nuclei and cerebellum compatible with small chronic infarcts appears stable. Additional patchy bilateral white matter hypodensity appears stable to FLAIR signal abnormality last month. No midline shift, ventriculomegaly, mass effect, evidence of mass lesion, intracranial hemorrhage or evidence of cortically based acute infarction. Vascular: Calcified atherosclerosis at the skull base. No new hyperdense vessel identified. Skull: No acute osseous abnormality identified. Sinuses/Orbits: Visualized paranasal sinuses and mastoids are stable and well aerated. Other: Stable visible face, orbit, and scalp soft tissues. ASPECTS [facility] Stroke Program Early CT Score) Total score (0-10 with 10 being normal): 10, when allowing for evolution of left MCA and ACA infarcts since August. IMPRESSION: 1. Expected evolution of the left MCA and ACA territory infarcts since last month. Underlying chronic small vessel disease. 2. No new cortically based infarct or acute intracranial hemorrhage identified. ASPECTS 10. 3. These results were communicated to Drs. Lindzen and Collins at [employer] am on 01/22/2021 by text page via the [employer] system. Electronically Signed   By: Odessa Fleming M.D.   On: 01/22/2021 07:13   CT ANGIO HEAD NECK W WO CM W PERF (CODE STROKE)  Result Date: 01/22/2021 CLINICAL DATA:  75 year old female code stroke. Status post left M1 occlusion, left ACA stenosis, ACA and MCA infarcts in August. EXAM: CT ANGIOGRAPHY HEAD AND  NECK CT PERFUSION BRAIN TECHNIQUE: Multidetector CT imaging of the head and neck was performed using the standard protocol during bolus administration of intravenous contrast. Multiplanar CT image reconstructions and MIPs were obtained to evaluate the vascular anatomy. Carotid stenosis measurements (when applicable) are obtained utilizing NASCET criteria, using the distal internal carotid diameter as the denominator. Multiphase CT imaging of the brain was performed following IV bolus contrast injection. Subsequent parametric perfusion maps were calculated using RAPID software. CONTRAST:  100 mL Omnipaque 350 COMPARISON:  Plain head CT 0659 hours today.  Brain MRI 11/14/2020. CTA head and neck 11/13/2020. FINDINGS: CT Brain Perfusion Findings: ASPECTS: 10 (of all vein left MCA and ACA infarcts since August). CBF (<30%) Volume: 0 Perfusion (Tmax>6.0s) volume: 0 Mismatch Volume: Not applicable Infarction Location:Not applicable CTA NECK Skeleton: Stable.  No acute osseous abnormality identified. Upper chest: Stable upper lung septal thickening with mild to moderate centrilobular emphysema. No superior mediastinal lymphadenopathy identified. Other neck: Stable. Aortic arch: Calcified aortic atherosclerosis. Three vessel arch configuration better demonstrated on the August CTA. Right carotid system: Visible brachiocephalic artery and right CCA origin are patent with plaque but no significant stenosis. 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 As before, no right ICA reconstitution to the skull base. Left carotid system: Stable left CCA plaque without stenosis. Bulky calcified plaque at the left ICA origin and bulb is stable with stenosis numerically  estimated at 70 % with respect to the distal vessel. The left ICA remains patent to the skull base without additional stenosis. Vertebral arteries: Stable proximal right subclavian and right vertebral artery  plaque with moderate stenosis of the non dominant right vertebral artery origin. The right vertebral remains patent to the skull base, diminutive, but with no additional stenosis. Stable proximal left subclavian artery plaque without stenosis. Mild calcified plaque at the left vertebral artery origin without stenosis. Dominant left vertebral with tortuous V1 segment. The left vertebral remains patent to the skull base without stenosis. CTA HEAD Posterior circulation: Non dominant right vertebral artery continues to the vertebrobasilar junction without stenosis. Right AICA appears dominant and patent. Dominant left vertebral V4 segment calcified plaque with mild to moderate stenosis appears stable (series 7, image 2 95). Stenosis is proximal to the normal left PICA origin and vertebrobasilar junction. Patent basilar artery with mild irregularity but no stenosis. Patent SCA and left PCA origins. Fetal type right PCA origin but with patent communication to the right SCA as before. Left posterior communicating is diminutive or absent. Bilateral PCA branches are stable and within normal limits. Anterior circulation: Right ICA terminus reconstituted from the posterior communicating artery as before. No right ICA siphon enhancement. Right MCA and ACA origins remain patent and within normal limits. Left ICA siphon calcified plaque with mild to moderate supraclinoid stenosis is stable. Patent left MCA and ACA origins. Normal anterior communicating artery. ACA A2 segments are patent without stenosis. Improved appearance of distal left ACA branches since August. No discrete branch occlusion now. Left MCA M1 segment has recanalized and is patent to the trifurcation without stenosis. Some left MCA branches are attenuated concordant with the recent infarction. No M2 branch occlusion is evident. Right MCA M1 segment and bifurcation are patent without stenosis. Right MCA branches appear stable and within normal limits. Venous  sinuses: Early contrast timing, grossly patent. Anatomic variants: Dominant left vertebral artery. Fetal type right PCA origin. Review of the MIP images confirms the above findings IMPRESSION: 1. Negative for emergent large vessel occlusion and no infarct core or ischemic penumbra detected by CT Perfusion. 2. 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 3. Recanalized and or improved Left MCA and distal Left ACA branches since August. 4. Stable atherosclerosis notable for: - high-grade proximal Left ICA stenosis estimated at 70%, due to bulky calcified plaque. - up to moderate bilateral vertebral artery stenosis (dominant left vertebral V4 segment and non dominant right vertebral artery origin). - mild to moderate supraclinoid Left ICA siphon stenosis due to calcified plaque. 5. Aortic Atherosclerosis (ICD10-I70.0). Emphysema (ICD10-J43.9). Salient findings discussed by telephone with Dr. Caryl Pina on 01/22/2021 at 07:36 . Electronically Signed   By: Odessa Fleming M.D.   On: 01/22/2021 07:41    PHYSICAL EXAM Frail elderly Caucasian lady not in distress. . Afebrile. Head is nontraumatic. Neck is supple without bruit.    Cardiac exam no murmur or gallop. Lungs are clear to auscultation. Distal pulses are well felt.  Neurological Exam :  Patient is awake and alert.  She has receptive and expressive aphasia with dysarthria she can be understood with some difficulty.  She cannot look only speak a few words and short sentences.  She can follow simple midline and one-step commands only.  She is not able to name and repeat.  Extraocular movements are full range without nystagmus.  She blinks to threat more on the  left than on the right.  She has moderate right lower facial weakness.  Tongue midline.  Motor system exam shows dense right hemiplegia with only minimal response to painful stimulus.  Antigravity strength on the left side.   Sensation appears preserved bilaterally.  Deep tendon flexes are symmetric plantars are downgoing.  Gait not tested  ASSESSMENT/PLAN Ms. ANTOINETT DORMAN is a 75 y.o. female with history of  stroke with right sided weakness, now in a SNF with residual right sided weakness and communication deficit, with extensive PMHx including anxiety, arthritis, atrial fibrillation (on Eliquis), Bell's palsy (left), nephrolithiasis, HTN, hypertrophic cardiomyopathy, mild aortic sclerosis, PVD and osteopenia who presents via EMS as a Code Stroke for new onset of left facial droop with worsened dysphasia. LKN was 0300. When staff at her SNF checked on her at 0530, the patient was noted to have left facial droop and slurred speech. EMS was called and on their arrival, the patient could follow only some commands, had left facial droop and was nonverbal except for being able to say her name and nod as well as say yes to questions. On arrival to the ED she was hypertensive, the same was noted, in addition to residual right sided weakness, but patient's "yes" statements were hypophonic, dysarthric and often made in response to questions which should have had a "no" response. She did give indications to EMS that she was having a headache, but was not clutching at her head. No vomiting. Does not appear to be nauseated. No diaphoresis. No pallor or cyanosis.    Stroke:  Likely recrudescence of previous Left MCA and ACA stroke now with new/increased diffusion restriction in the left basal ganglia, internal and external capsules, extending to the caudate body/corona radiata    Code Stroke CT head 1. Expected evolution of the left MCA and ACA territory infarcts since last month. Underlying chronic small vessel disease. 2. No new cortically based infarct or acute intracranial hemorrhage identified. ASPECTS 10. CTA head & neck  1. Negative for emergent large vessel occlusion and no infarct core or ischemic penumbra detected by CT  Perfusion. 2. 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 3. Recanalized and or improved Left MCA and distal Left ACA branches since August. 4. Stable atherosclerosis notable for: - high-grade proximal Left ICA stenosis estimated at 70%, due to bulky calcified plaque. - up to moderate bilateral vertebral artery stenosis (dominant left vertebral V4 segment and non dominant right vertebral arteryorigin). - mild to moderate supraclinoid Left ICA siphon stenosis due to calcified plaque. 5. Aortic Atherosclerosis (ICD10-I70.0). Emphysema (ICD10-J43.9).  MRI   1. New/increased diffusion restriction in the left basal ganglia, internal and external capsules, extending to the caudate body/corona radiata compared to the study from 11/14/2020 consistent with acute to subacute infarct. 2. Additional diffusion restriction in the left MCA and ACA territories consistent with evolving subacute infarct as seen on the study from 11/14/2020. 3. Curvilinear SWI signal dropout along the left precentral sulcus consistent with petechial hemorrhage. There is an additional focus of petechial hemorrhage versus punctate chronic microhemorrhage in the left superior frontal gyru 2D Echo Aug 2022: LVH, Grade II  diastolic dysfunction,  Left atrial size was severely dilated.   Right atrial size was mildly dilated LDL 120 HgbA1c 6.0 VTE prophylaxis - on eliquis    Diet   Diet NPO time specified   On Eliquis prior to admission Recommend to continue Eliquis Therapy  recommendations:  Pending Disposition:  likely to return SNF  History of  LMCA Stroke  Aug 2022, s/p IR with TICI 3 in the setting of PAF taken off eliquis x 1 wk due to abdominal hematoma   CTH w/L MCA stroke.  MRI Brain revealed left MCA and ACA stroke.  CTA Head and Neck showed occlusion of the left MCA and an age indeterminate occlusion of the proximal right  internal carotid artery in the neck with non-opacification through the carotid terminus.  Echo w/EF 60 to 65 %, left atrium severely dilated.  LDL 120  hemoglobin a1c 6.0.   Hypertensive Emergency Hypertensive 206/125 on arrival  Stable  Permissive hypertension (OK if < 220/120) but gradually normalize in 5-7 days Long-term BP goal normotensive  Hyperlipidemia Home meds:  zetia  LDL 120, goal < 70 High intensity statin: has multiple statin allergies, taking home zetia  Continue zetia at discharge         Dysphagia Swallow eval by SLP  is PENDING Had made progress at SNF prior to admission to taking oral diet/meds  Other stroke risk factors Advanced age Former smoker PAD Recent left MCA stroke Aug 2022:  Other Active Problems  Hospital day # 0  This patient was seen and evaluated with Dr. Pearlean Brownie. He directed the plan of care.  Shon Hale, NP-C   STROKE MD NOTE : I have personally obtained history,examined this patient, reviewed notes, independently viewed imaging studies, participated in medical decision making and plan of care.ROS completed by me personally and pertinent positives fully documented  I have made any additions or clarifications directly to the above note. Agree with note above.  Patient presented in August with left M1 occlusion and global aphasia and right hemiparesis she was treated with successful mechanical thrombectomy but had significant residual deficits.  She was recovering in skilled nursing facility and comes in with worsening of her existing deficits and MRI shows additional left hemispheric infarcts adjacent to the previous ones.  CT angiogram shows left MCA still to be patent.  Her neurological exam demonstrates profound aphasia with dense right hemiplegia.  Patient has A. fib and is presumably getting Eliquis.  I do not see solid evidence to justify switching Eliquis to alternative anticoagulants or adding aspirin as improvement to be superior to  discontinuing it.  I had a long discussion with the patient's husband at the bedside and answered questions.  I do not think repeating echocardiogram and further stroke work-up is necessary as she had a complete evaluation 2 months ago.  Speech therapy to check swallow eval.  Continue ongoing therapy managed medically stable for transfer back to skilled nursing facility next few days.  Discussed with Dr.Ghimire and patient's husband.  Greater than 50% time during this 31-minute visit was spent on counseling and coordination of care and answering questions about her recurrent stroke and confusion.     Delia Heady, MD Medical Director [facility] Pager: [phone] 01/22/2021 5:03 PM   To contact Stroke Continuity provider, please refer to [email or web address]. After hours, contact General Neurology

## 2021-01-22 NOTE — ED Notes (Addendum)
Troponin of 132 reported to [facility]

## 2021-01-22 NOTE — ED Notes (Signed)
Pt returned from MRI, complaining of Headache and left hip pain

## 2021-01-22 NOTE — ED Provider Notes (Signed)
Emergency Department Provider Note   I have reviewed the triage vital signs and the nursing notes.   HISTORY  Chief Complaint Code Stroke   HPI Beverly Li is a 75 y.o. female with past medical history reviewed below including prior strokes this year leading to significant change in functional status, now at a skilled nursing facility with baseline right-sided deficits, presents to the emergency department as a code stroke.  Last seen normal at 3 AM by staff.  Found to have significant worsening of her dysarthria and perceived worsening right-sided weakness.  The patient is able to communicate with me in short phrases at times and yes/no questioning.  She denies pain.  And confirms that her speech does seem worse to her.  She has difficulty providing additional detailed history.  Level 5 caveat: CVA - dysarthria.    Past Medical History:  Diagnosis Date   Anxiety    Arthritis    "some in my lower back; probably elbows, knees" (11/18/2017)   Atrial fibrillation (HCC)    Bell's palsy    when pt. was 75 yrs old, when under stress the left side of face will droop.   Complication of anesthesia    "vascular OR 2016; BP bottomed out; couldn't get it regulated; ended up in ICU for DAYS" (11/18/2017)   GERD (gastroesophageal reflux disease)    History of kidney stones    Hypertension    Hypertrophic cardiomyopathy (HCC)    severe LV basilar hypertrophy witn no evidence of significant outflow tract obstruction, EF 65-70%, mild LAE, mild TR, grade 1a diastolic dysfunction 05/15/10 (Dr. 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   Sepsis due to pneumonia (HCC) 12/12/2020   Dysphagia 12/12/2020   GERD (gastroesophageal reflux disease) 12/12/2020   Hypoxia    Respiratory distress    Respiration abnormal    Pressure injury of skin 11/23/2020   Protein-calorie malnutrition, severe 11/16/2020   Stroke (HCC) 11/13/2020   Middle cerebral artery embolism, left 11/13/2020   Encounter for feeding tube placement 04/27/2020   Sinus bradycardia 12/25/2019   COPD (chronic obstructive pulmonary disease) (HCC) 12/16/2018   Cough 12/16/2018   Unintentional weight loss 10/05/2018   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   Visit for monitoring Tikosyn therapy 11/18/2017   Closed nondisplaced fracture of head of radius with routine healing 03/03/2017   Coronary artery disease involving native coronary artery of native heart without angina pectoris    SOB (shortness of breath) 01/26/2016   Acute on chronic diastolic CHF (congestive heart failure) (HCC) 03/28/2015   Paroxysmal atrial fibrillation (HCC)    Essential hypertension    S/P femoral-popliteal bypass surgery    Obstructive hypertrophic cardiomyopathy (HCC) 03/03/2015   PAD (peripheral artery disease) (HCC) 03/02/2015   Acute respiratory failure with hypoxia (HCC) 03/02/2015   Claudication (HCC) 01/31/2015   Former smoker 01/31/2015   Hyperlipidemia 01/31/2015   Atherosclerosis of  native arteries of extremity with intermittent claudication (HCC) 02/10/2014   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   Atrial arrhythmia    Mild aortic sclerosis     Past Surgical History:  Procedure Laterality Date   AUGMENTATION MAMMAPLASTY Bilateral    BACK SURGERY     CARDIAC CATHETERIZATION N/A 05/07/2016   Procedure: Left Heart Cath and Coronary Angiography;  Surgeon: Kathleene Hazel, MD;   Location: [facility] INVASIVE CV LAB;  Service: Cardiovascular;  Laterality: N/A;   CARDIOVERSION N/A 09/24/2017   Procedure: CARDIOVERSION;  Surgeon: Laurey Morale, MD;  Location: [facility] ENDOSCOPY;  Service: Cardiovascular;  Laterality: N/A;   DILATION AND CURETTAGE OF UTERUS     ENDARTERECTOMY FEMORAL Right 03/02/2015   Procedure: ENDARTERECTOMY RIGHT FEMORAL;  Surgeon: Chuck Hint, MD;  Location: [facility] OR;  Service: Vascular;  Laterality: Right;   ESOPHAGOGASTRODUODENOSCOPY (EGD) WITH PROPOFOL N/A 12/01/2020   Procedure: ESOPHAGOGASTRODUODENOSCOPY (EGD) WITH PROPOFOL;  Surgeon: Diamantina Monks, MD;  Location: [facility] ENDOSCOPY;  Service: General;  Laterality: N/A;   FACIAL COSMETIC SURGERY Left 2002   "related to Bell's Palsy @ age 38; left eye/side of face droopy; tried to make area symmetrical"   FEMORAL-POPLITEAL BYPASS GRAFT Right 03/02/2015   Procedure: BYPASS GRAFT FEMORAL-BELOW KNEE POPLITEAL ARTERY;  Surgeon: Chuck Hint, MD;  Location: [facility] OR;  Service: Vascular;  Laterality: Right;   INGUINAL HERNIA REPAIR Bilateral 2002   IR ANGIO INTRA EXTRACRAN SEL COM CAROTID INNOMINATE UNI L MOD SED  11/16/2020   IR CT HEAD LTD  11/13/2020   IR PERCUTANEOUS ART THROMBECTOMY/INFUSION INTRACRANIAL INC DIAG ANGIO  11/13/2020   OVARIAN CYST REMOVAL Left    PEG PLACEMENT N/A 12/01/2020   Procedure: PERCUTANEOUS ENDOSCOPIC GASTROSTOMY (PEG) PLACEMENT;  Surgeon: Diamantina Monks, MD;  Location: [facility] ENDOSCOPY;  Service: General;  Laterality: N/A;   PERIPHERAL VASCULAR CATHETERIZATION N/A 01/16/2015   Procedure: Abdominal Aortogram;  Surgeon: Chuck Hint, MD;  Location: [facility] INVASIVE CV LAB;  Service: Cardiovascular;  Laterality: N/A;   POSTERIOR LUMBAR FUSION  2015   "have plates and screws in there"   RADIOLOGY WITH ANESTHESIA N/A 11/13/2020   Procedure: IR WITH ANESTHESIA;  Surgeon: Radiologist, Medication, MD;  Location: [facility] OR;  Service: Radiology;  Laterality: N/A;   TONSILLECTOMY       Allergies Amoxicillin, Atenolol, Crestor [rosuvastatin calcium], Pravastatin, Sulfa antibiotics, and Codeine  Family History  Problem Relation Age of Onset   Liver cancer Mother    Cancer Mother        Liver   Hypertension Mother    Lung cancer Father    Cancer Father        Lung   Breast cancer Sister    Cancer Sister        Breast    Social History Social History   Tobacco Use   Smoking status: Former    Packs/day: 1.00    Years: 50.00    Pack years: 50.00    Types: Cigarettes    Quit date: 12/15/2014    Years since quitting: 6.1   Smokeless tobacco: Never  Vaping Use   Vaping Use: Never used  Substance Use Topics   Alcohol use: Yes    Comment: 11/18/2017 "might have a couple glasses of wine/month; if that"    Review of Systems  Level 5 caveat: CVA - dysarthria   ____________________________________________   PHYSICAL EXAM:  VITAL SIGNS: Vitals:   01/22/21 0730 01/22/21 0801  BP: (!) 179/107 (!) 182/111  Pulse: 83 77  Resp: (!) 22 19  Temp:  98 F (36.7 C)  SpO2: 95% 98%     Constitutional: Alert and following commands. No acute distress.  Eyes: Conjunctivae are normal. PERRL.  Head: Atraumatic. Nose: No congestion/rhinnorhea. Mouth/Throat: Mucous membranes are moist.  Neck: No stridor.   Cardiovascular: Normal rate, regular rhythm. Good peripheral circulation. Grossly normal heart sounds.   Respiratory: Normal respiratory effort.  No retractions. Lungs CTAB. Gastrointestinal: Soft and nontender. No distention. PEG tube site appears clean.  Musculoskeletal: No gross deformities of extremities. Neurologic: Dysarthria noted with flaccid paralysis of the RUE and RLE along with right face droop. 5/5 strength on the left without drift.  Skin:  Skin is warm, dry and intact. No rash noted.   ____________________________________________   LABS (all labs ordered are listed, but only abnormal results are displayed)  Labs Reviewed  CBC - Abnormal;  Notable for the following components:      Result Value   WBC 10.7 (*)    Hemoglobin 15.5 (*)    HCT 48.4 (*)    All other components within normal limits  COMPREHENSIVE METABOLIC PANEL - Abnormal; Notable for the following components:   CO2 33 (*)    Glucose, Bld 104 (*)    Total Protein 6.1 (*)    Albumin 3.4 (*)    Total Bilirubin 1.3 (*)    All other components within normal limits  I-STAT CHEM 8, ED - Abnormal; Notable for the following components:   Glucose, Bld 106 (*)    Calcium, Ion 1.06 (*)    TCO2 33 (*)    Hemoglobin 16.7 (*)    HCT 49.0 (*)    All other components within normal limits  CBG MONITORING, ED - Abnormal; Notable for the following components:   Glucose-Capillary 110 (*)    All other components within normal limits  RESP PANEL BY RT-PCR (FLU A&B, COVID) ARPGX2  PROTIME-INR  APTT  DIFFERENTIAL  TROPONIN I (HIGH SENSITIVITY)   ____________________________________________  EKG   EKG Interpretation  Date/Time:  Monday January 22 2021 07:38:15 EDT Ventricular Rate:  87 PR Interval:    QRS Duration: 99 QT Interval:  415 QTC Calculation: 500 R Axis:   13 Text Interpretation: Atrial fibrillation LVH with secondary repolarization abnormality Anterior Q waves, possibly due to LVH ST depression, consider ischemia, diffuse lds ST change new from prior Confirmed by Alona Bene [phone]) on 01/22/2021 7:45:11 AM        ____________________________________________  RADIOLOGY  CT HEAD CODE STROKE WO CONTRAST  Result Date: 01/22/2021 CLINICAL DATA:  Code stroke. 75 year old female with neurologic deficit. Left ACA and MCA territory infarcts in August. EXAM: CT HEAD WITHOUT CONTRAST TECHNIQUE: Contiguous axial images were obtained from the base of the skull through the vertex without intravenous contrast. COMPARISON:  Brain MRI 11/14/2020, head CT 11/13/2020. FINDINGS: Brain: Hypodensity in the left MCA and ACA territories corresponding to restricted  diffusion last month, resembling developing encephalomalacia. No new areas of cytotoxic edema identified. Heterogeneity in the right deep gray matter nuclei and cerebellum compatible with small chronic infarcts appears stable. Additional patchy bilateral white matter hypodensity appears stable to FLAIR signal abnormality last month. No midline shift, ventriculomegaly, mass effect, evidence of mass lesion, intracranial hemorrhage or evidence of cortically based acute infarction. Vascular: Calcified atherosclerosis at the skull base. No new hyperdense vessel identified. Skull: No acute osseous abnormality identified. Sinuses/Orbits: Visualized paranasal sinuses and mastoids are stable and well aerated. Other: Stable visible face,  orbit, and scalp soft tissues. ASPECTS [facility] Stroke Program Early CT Score) Total score (0-10 with 10 being normal): 10, when allowing for evolution of left MCA and ACA infarcts since August. IMPRESSION: 1. Expected evolution of the left MCA and ACA territory infarcts since last month. Underlying chronic small vessel disease. 2. No new cortically based infarct or acute intracranial hemorrhage identified. ASPECTS 10. 3. These results were communicated to Drs. Lindzen and Collins at [employer] am on 01/22/2021 by text page via the [employer] system. Electronically Signed   By: Odessa Fleming M.D.   On: 01/22/2021 07:13   CT ANGIO HEAD NECK W WO CM W PERF (CODE STROKE)  Result Date: 01/22/2021 CLINICAL DATA:  75 year old female code stroke. Status post left M1 occlusion, left ACA stenosis, ACA and MCA infarcts in August. EXAM: CT ANGIOGRAPHY HEAD AND NECK CT PERFUSION BRAIN TECHNIQUE: Multidetector CT imaging of the head and neck was performed using the standard protocol during bolus administration of intravenous contrast. Multiplanar CT image reconstructions and MIPs were obtained to evaluate the vascular anatomy. Carotid stenosis measurements (when applicable) are obtained utilizing NASCET  criteria, using the distal internal carotid diameter as the denominator. Multiphase CT imaging of the brain was performed following IV bolus contrast injection. Subsequent parametric perfusion maps were calculated using RAPID software. CONTRAST:  100 mL Omnipaque 350 COMPARISON:  Plain head CT 0659 hours today.  Brain MRI 11/14/2020. CTA head and neck 11/13/2020. FINDINGS: CT Brain Perfusion Findings: ASPECTS: 10 (of all vein left MCA and ACA infarcts since August). CBF (<30%) Volume: 0 Perfusion (Tmax>6.0s) volume: 0 Mismatch Volume: Not applicable Infarction Location:Not applicable CTA NECK Skeleton: Stable.  No acute osseous abnormality identified. Upper chest: Stable upper lung septal thickening with mild to moderate centrilobular emphysema. No superior mediastinal lymphadenopathy identified. Other neck: Stable. Aortic arch: Calcified aortic atherosclerosis. Three vessel arch configuration better demonstrated on the August CTA. Right carotid system: Visible brachiocephalic artery and right CCA origin are patent with plaque but no significant stenosis. 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 As before, no right ICA reconstitution to the skull base. Left carotid system: Stable left CCA plaque without stenosis. Bulky calcified plaque at the left ICA origin and bulb is stable with stenosis numerically estimated at 70 % with respect to the distal vessel. The left ICA remains patent to the skull base without additional stenosis. Vertebral arteries: Stable proximal right subclavian and right vertebral artery plaque with moderate stenosis of the non dominant right vertebral artery origin. The right vertebral remains patent to the skull base, diminutive, but with no additional stenosis. Stable proximal left subclavian artery plaque without stenosis. Mild calcified plaque at the left vertebral artery origin without stenosis. Dominant left vertebral with  tortuous V1 segment. The left vertebral remains patent to the skull base without stenosis. CTA HEAD Posterior circulation: Non dominant right vertebral artery continues to the vertebrobasilar junction without stenosis. Right AICA appears dominant and patent. Dominant left vertebral V4 segment calcified plaque with mild to moderate stenosis appears stable (series 7, image 2 95). Stenosis is proximal to the normal left PICA origin and vertebrobasilar junction. Patent basilar artery with mild irregularity but no stenosis. Patent SCA and left PCA origins. Fetal type right PCA origin but with patent communication to the right SCA as before. Left posterior communicating is diminutive or absent. Bilateral PCA branches are stable and within normal limits. Anterior circulation: Right ICA terminus reconstituted from  the posterior communicating artery as before. No right ICA siphon enhancement. Right MCA and ACA origins remain patent and within normal limits. Left ICA siphon calcified plaque with mild to moderate supraclinoid stenosis is stable. Patent left MCA and ACA origins. Normal anterior communicating artery. ACA A2 segments are patent without stenosis. Improved appearance of distal left ACA branches since August. No discrete branch occlusion now. Left MCA M1 segment has recanalized and is patent to the trifurcation without stenosis. Some left MCA branches are attenuated concordant with the recent infarction. No M2 branch occlusion is evident. Right MCA M1 segment and bifurcation are patent without stenosis. Right MCA branches appear stable and within normal limits. Venous sinuses: Early contrast timing, grossly patent. Anatomic variants: Dominant left vertebral artery. Fetal type right PCA origin. Review of the MIP images confirms the above findings IMPRESSION: 1. Negative for emergent large vessel occlusion and no infarct core or ischemic penumbra detected by CT Perfusion. 2. 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 3. Recanalized and or improved Left MCA and distal Left ACA branches since August. 4. Stable atherosclerosis notable for: - high-grade proximal Left ICA stenosis estimated at 70%, due to bulky calcified plaque. - up to moderate bilateral vertebral artery stenosis (dominant left vertebral V4 segment and non dominant right vertebral artery origin). - mild to moderate supraclinoid Left ICA siphon stenosis due to calcified plaque. 5. Aortic Atherosclerosis (ICD10-I70.0). Emphysema (ICD10-J43.9). Salient findings discussed by telephone with Dr. Caryl Pina on 01/22/2021 at 07:36 . Electronically Signed   By: Odessa Fleming M.D.   On: 01/22/2021 07:41    ____________________________________________   PROCEDURES  Procedure(s) performed:   Procedures  CRITICAL CARE Performed by: Maia Plan Total critical care time: 35 minutes Critical care time was exclusive of separately billable procedures and treating other patients. Critical care was necessary to treat or prevent imminent or life-threatening deterioration. Critical care was time spent personally by me on the following activities: development of treatment plan with patient and/or surrogate as well as nursing, discussions with consultants, evaluation of patient's response to treatment, examination of patient, obtaining history from patient or surrogate, ordering and performing treatments and interventions, ordering and review of laboratory studies, ordering and review of radiographic studies, pulse oximetry and re-evaluation of patient's condition.  Alona Bene, MD Emergency Medicine  ____________________________________________   INITIAL IMPRESSION / ASSESSMENT AND PLAN / ED COURSE  Pertinent labs & imaging results that were available during my care of the patient were reviewed by me and considered in my medical decision making (see chart for details).    Patient arrives to the emergency department as a code stroke.  She is not a TNK candidate as she is anticoagulated.  She likely has recrudescence of her old left MCA infarct.  Patient's dysarthria and weakness reportedly worse.  Discussed the case with neurology, Dr. Otelia Limes, after evaluation.  She does not have an LVO on angio but will need admit for monitoring, MRI, and PT/OT re-evaluation.  In review of her EKG, obtained during stroke work-up, she does appear to have diffuse ST depressions along with some elevation in aVR.  I do not feel this meets STEMI criteria but will discuss with cardiology as this does seem new from prior.  Patient denies any ongoing pain, including chest pain, and so likely not primary ACS but EKG is certainly very different.  I have added a troponin to her w/u here.  07:52 AM  Spoke with Cardiology team. They will consult with EKG change. Has intermittently had similar changes on prior tracings but will follow troponin. Appreciate Cardiology consultation.   Plan for modified permissive HTN per Neuro. Plan for PRN labetalol for SBP > 180 or DBP > 110.   Discussed patient's case with TRH to request admission. Patient and family (if present) updated with plan. Care transferred to [facility] service.  I reviewed all nursing notes, vitals, pertinent old records, EKGs, labs, imaging (as available).  ____________________________________________  FINAL CLINICAL IMPRESSION(S) / ED DIAGNOSES  Final diagnoses:  Stroke-like symptoms    MEDICATIONS GIVEN DURING THIS VISIT:  Medications  labetalol (NORMODYNE) injection 5 mg (has no administration in time range)  sodium chloride flush (NS) 0.9 % injection 3 mL (3 mLs Intravenous Given 01/22/21 0753)  iohexol (OMNIPAQUE) 350 MG/ML injection 100 mL (100 mLs Intravenous Contrast Given 01/22/21 0725)     Note:  This document was prepared using Dragon voice recognition software and may include unintentional dictation errors.  Alona Bene, MD, [facility] Emergency Medicine    Rudolpho Claxton, Arlyss Repress, MD 01/22/21 [phone]

## 2021-01-22 NOTE — Consult Note (Signed)
NEURO HOSPITALIST CONSULT NOTE   Requestig physician: Dr. Jacqulyn Bath  Reason for Consult: Acute onset of left sided facial droop and aphasia on baseline of right sided weakness and communication deficit from recent stroke  History obtained from:  EMS and Chart     HPI:                                                                                                                                          Beverly Li is an 75 y.o. 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 LKN was 0300. When staff at her SNF checked on her at 0530, the patient was noted to have left facial droop and slurred speech. EMS was called and on their arrival, the patient could follow only some commands, had left facial droop and was nonverbal except for being able to say her name and nod as well as say yes to questions. On arrival to the ED, the same was noted, in addition to residual right sided weakness, but patient's "yes" statements were hypophonic, dysarthric and often made in response to questions which should have had a "no" response. She did give indications to EMS that she was having a headache, but was not clutching at her head. No vomiting. Does not appear to be nauseated. No diaphoresis. No pallor or cyanosis.   She is on Eliquis and has not missed any doses.   mRS: 4  NIHSS: 19  Past Medical History:  Diagnosis Date   Anxiety    Arthritis    "some in my lower back; probably elbows, knees" (11/18/2017)   Atrial fibrillation (HCC)    Bell's palsy    when pt. was 75 yrs  old, when under stress the left side of face will droop.   Complication of anesthesia    "vascular OR 2016; BP bottomed out; couldn't get it regulated; ended up in ICU for DAYS" (11/18/2017)   GERD (gastroesophageal reflux disease)    History of kidney stones    Hypertension    Hypertrophic cardiomyopathy (HCC)    severe LV basilar hypertrophy witn no evidence of significant outflow tract obstruction, EF 65-70%, mild LAE, mild TR, grade 1a diastolic dysfunction 05/15/10 (Dr. Donato Schultz) (Atrial Septal Hypertrophy pattern)-- Intra-op TEE with dsignificant outflow tract obstruction - AI, MR & TR   Insomnia    Mild aortic sclerosis    Osteopenia    Peripheral vascular disease (  HCC)    Syncope    , Vagal    Past Surgical History:  Procedure Laterality Date   AUGMENTATION MAMMAPLASTY Bilateral    BACK SURGERY     CARDIAC CATHETERIZATION N/A 05/07/2016   Procedure: Left Heart Cath and Coronary Angiography;  Surgeon: Kathleene Hazel, MD;  Location: [facility] INVASIVE CV LAB;  Service: Cardiovascular;  Laterality: N/A;   CARDIOVERSION N/A 09/24/2017   Procedure: CARDIOVERSION;  Surgeon: Laurey Morale, MD;  Location: [facility] ENDOSCOPY;  Service: Cardiovascular;  Laterality: N/A;   DILATION AND CURETTAGE OF UTERUS     ENDARTERECTOMY FEMORAL Right 03/02/2015   Procedure: ENDARTERECTOMY RIGHT FEMORAL;  Surgeon: Chuck Hint, MD;  Location: [facility] OR;  Service: Vascular;  Laterality: Right;   ESOPHAGOGASTRODUODENOSCOPY (EGD) WITH PROPOFOL N/A 12/01/2020   Procedure: ESOPHAGOGASTRODUODENOSCOPY (EGD) WITH PROPOFOL;  Surgeon: Diamantina Monks, MD;  Location: [facility] ENDOSCOPY;  Service: General;  Laterality: N/A;   FACIAL COSMETIC SURGERY Left 2002   "related to Bell's Palsy @ age 38; left eye/side of face droopy; tried to make area symmetrical"   FEMORAL-POPLITEAL BYPASS GRAFT Right 03/02/2015   Procedure: BYPASS GRAFT FEMORAL-BELOW KNEE POPLITEAL ARTERY;  Surgeon: Chuck Hint, MD;  Location: [facility]  OR;  Service: Vascular;  Laterality: Right;   INGUINAL HERNIA REPAIR Bilateral 2002   IR ANGIO INTRA EXTRACRAN SEL COM CAROTID INNOMINATE UNI L MOD SED  11/16/2020   IR CT HEAD LTD  11/13/2020   IR PERCUTANEOUS ART THROMBECTOMY/INFUSION INTRACRANIAL INC DIAG ANGIO  11/13/2020   OVARIAN CYST REMOVAL Left    PEG PLACEMENT N/A 12/01/2020   Procedure: PERCUTANEOUS ENDOSCOPIC GASTROSTOMY (PEG) PLACEMENT;  Surgeon: Diamantina Monks, MD;  Location: [facility] ENDOSCOPY;  Service: General;  Laterality: N/A;   PERIPHERAL VASCULAR CATHETERIZATION N/A 01/16/2015   Procedure: Abdominal Aortogram;  Surgeon: Chuck Hint, MD;  Location: [facility] INVASIVE CV LAB;  Service: Cardiovascular;  Laterality: N/A;   POSTERIOR LUMBAR FUSION  2015   "have plates and screws in there"   RADIOLOGY WITH ANESTHESIA N/A 11/13/2020   Procedure: IR WITH ANESTHESIA;  Surgeon: Radiologist, Medication, MD;  Location: [facility] OR;  Service: Radiology;  Laterality: N/A;   TONSILLECTOMY      Family History  Problem Relation Age of Onset   Liver cancer Mother    Cancer Mother        Liver   Hypertension Mother    Lung cancer Father    Cancer Father        Lung   Breast cancer Sister    Cancer Sister        Breast             Social History:  reports that she quit smoking about 6 years ago. Her smoking use included cigarettes. She has a 50.00 pack-year smoking history. She has never used smokeless tobacco. She reports current alcohol use. No history on file for drug use.  Allergies  Allergen Reactions   Amoxicillin Other (See Comments)    UTI Has patient had a PCN reaction causing immediate rash, facial/tongue/throat swelling, SOB or lightheadedness with hypotension: No Has patient had a PCN reaction causing severe rash involving mucus membranes or skin necrosis: No Has patient had a PCN reaction that required hospitalization: No Has patient had a PCN reaction occurring within the last 10 years: Yes--UTI ONLY If all of the above answers  are "NO", then may proceed with Cephalosporin use.    Atenolol Cough   Crestor [Rosuvastatin Calcium] Other (See Comments)  Muscle aches   Pravastatin Other (See Comments)    Muscle aches   Sulfa Antibiotics Nausea Only   Codeine     hallucinations    SNF MEDICATIONS:                                                                                                                      No current facility-administered medications on file prior to encounter.   Current Outpatient Medications on File Prior to Encounter  Medication Sig Dispense Refill   acetaminophen (TYLENOL) 160 MG/5ML solution Place 20.3 mLs (650 mg total) into feeding tube every 4 (four) hours as needed for mild pain (or temp > 37.5 C (99.5 F)). 120 mL 0   aluminum-magnesium hydroxide-simethicone (MAALOX) 200-200-20 MG/5ML SUSP Take 30 mLs by mouth every 4 (four) hours as needed (for heartburn up to 72 hours).     amiodarone (PACERONE) 200 MG tablet Place 1 tablet (200 mg total) into feeding tube daily.     apixaban (ELIQUIS) 5 MG TABS tablet Place 1 tablet (5 mg total) into feeding tube 2 (two) times daily. 60 tablet    budesonide (PULMICORT) 0.5 MG/2ML nebulizer solution Take 2 mLs (0.5 mg total) by nebulization 2 (two) times daily.  12   buPROPion (WELLBUTRIN) 75 MG tablet Place 1 tablet (75 mg total) into feeding tube 2 (two) times daily.     ezetimibe (ZETIA) 10 MG tablet Place 1 tablet (10 mg total) into feeding tube daily.     hydrALAZINE (APRESOLINE) 10 MG tablet Take 10 mg by mouth 3 (three) times daily as needed. SBP>165 or DBP >95     ipratropium-albuterol (DUONEB) 0.5-2.5 (3) MG/3ML SOLN Take 3 mLs by nebulization every 4 (four) hours as needed. 360 mL    metoprolol tartrate (LOPRESSOR) 50 MG tablet Place 1 tablet (50 mg total) into feeding tube 2 (two) times daily.     pantoprazole sodium (PROTONIX) 40 mg Place 40 mg into feeding tube daily.     Water For Irrigation, Sterile (FREE WATER) SOLN Place 350 mLs  into feeding tube 4 (four) times daily.     Water For Irrigation, Sterile (FREE WATER) SOLN Place 250 mLs into feeding tube 6 (six) times daily.       ROS:  Unable to obtain due to aphasia.    There were no vitals taken for this visit.   General Examination:                                                                                                       Physical Exam  HEENT-  [place]/AT   Lungs- Respirations unlabored Extremities- No edema  Neurological Examination Mental Status: Awake and alert. Dense receptive and expressive aphasia. Murmurs a dysarthric and hypophonic "yea" to several questions, some incorrectly. Otherwise no speech output. Unable to count fingers, name objects or follow verbal commands. Will follow some commands that are pantomimed to her.  Cranial Nerves: II: Blinks to threat in right and left temporal visual fields. PERRL.   III,IV, VI: No ptosis. Will track to left and right without hesitation. EOM are conjugate. No nystagmus.  V,VII: Combined right and left lower quadrant weakness. Right half of labial fissure is of decreased height compared to the left, but during expiration, breathes out of left side of mouth, which seems to have decreased tone. When asked to smile, both sides of face contract weakly, suggestive of bilateral deficit. Upper quadrants of face contract normally. VIII: Unable to formally assess hearing. But alerts to voice.  IX,X: Not following commands for assessment of palatal elevation.  XI: Can turn head to the left and right. XII: Tongue deviates slightly to the right Motor/Sensory: RUE: Flaccid tone .No effort against gravity when passively elevated and released. No response to pinch.  RLE: Flaccid tone .No effort against gravity when passively elevated and released. No response to pinch; minimal movement to  plantar stimulation. LUE: Remains elevated antigravity after passive elevation and release, without drift. Will grip examiner's hand, but not tightly. Unable to follow commands for triceps and biceps strength testing.  LLE: Remains elevated antigravity after passive elevation and release, without drift. Unable to follow commands for detailed strength testing.  Sensory: Pinprick and light touch intact throughout, bilaterally Deep Tendon Reflexes: Asymmetric brachioradialis reflexes. 2+ and symmetric patellar reflexes.  Plantars: Right upgoing, left equivocal.  Cerebellar: Was able to follow pantomimed commands for FNF and H-S testing on the left, which she performed without ataxia. Unable to perform on the right.  Gait: Unable to assess   Lab Results: Basic Metabolic Panel: No results for input(s): NA, K, CL, CO2, GLUCOSE, BUN, CREATININE, CALCIUM, MG, PHOS in the last 168 hours.  CBC: No results for input(s): WBC, NEUTROABS, HGB, HCT, MCV, PLT in the last 168 hours.  Cardiac Enzymes: No results for input(s): CKTOTAL, CKMB, CKMBINDEX, TROPONINI in the last 168 hours.  Lipid Panel: No results for input(s): CHOL, TRIG, HDL, CHOLHDL, VLDL, LDLCALC in the last 168 hours.  Imaging: No results found.   Assessment: 75 year old female with recent left MCA and ACA strokes, presenting with acute onset of worsened speech in conjunction with left facial droop 1. Exam findings best localize to the left MCA territory. Most likely extension of her recent left MCA stroke versus a new acute stroke in this territory. NIHSS of 19.  2.  CT head: Expected evolution of the left MCA and ACA territory infarcts since last month. Underlying chronic small vessel disease. No new cortically based infarct or acute intracranial hemorrhage identified. ASPECTS 10. 3. CTA head and neck: Negative for emergent large vessel occlusion. 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 Recanalized and or improved Left MCA and distal Left ACA branches since August.  Stable atherosclerosis notable for:  -high-grade proximal Left ICA stenosis estimated at 70%, due to bulky calcified plaque. - up to moderate bilateral vertebral artery stenosis (dominant left vertebral V4 segment and non dominant right vertebral artery origin). - mild to moderate supraclinoid Left ICA siphon stenosis due to calcified plaque. 4. CTP: No infarct core or ischemic penumbra detected by CT Perfusion. 5. On DOAC. Not a TNK candidate.  6. mRS of 4. Not a thrombectomy candidate.   Recommendations: 1. MRI brain 2. Frequent neuro checks.  3. Modified permissive HTN given advanced age, recent stroke, anticoagulated with risk of bleeding. SBP < 180 and DBP < 110 4. Continue Eliquis.  5. PT/OT/Speech   Electronically signed: Dr. Caryl Pina 01/22/2021, 6:56 AM

## 2021-01-22 NOTE — Consult Note (Addendum)
Cardiology Consultation:   Patient ID: Beverly Li MRN: [id number]; DOB: 09-Jan-1946  Admit date: 01/22/2021 Date of Consult: 01/22/2021  PCP:  [name], MD   [facility] Providers Cardiologist:  Donato Schultz, MD  Electrophysiologist:  Sherryl Manges, MD    Patient Profile:   Beverly Li is a 75 y.o. female with a hx of persistent Afib (off OAC 2/2 abd hematoma from coughing), multiple CVAs, HTN, HOCM, GERD, moderate CAD on cath '18, PVD s/p R fem-pop who is being seen 01/22/2021 for the evaluation of abnormal EKG/Elevated troponin at the request of Dr. Jerral Ralph.  History of Present Illness:   Beverly Li is a 75 yo female with PMH noted above. She has been followed by Dr. Anne Fu and Dr. Graciela Husbands in the past. Underwent cardiac cath '18 that showed 20% mRCA, 20% 2nd OM, mLAD 60%. Developed profound hypotension post cath and required brief stay in the ICU with pressers. She was hospitalized in [facility] earlier this year with a large abd hematoma that occurred after coughing. It was recommended her Tikosyn be stopped as she remained in Afib for prolonged period of time there and was not anticoagulated. She was to hold the Eliquis for several weeks until the hematoma resolved. It was planned for her to be seen back in the office the first week of August. Unfortunately, she presented with stroke symptoms on 8/1/ CT head showed LVO with L M1 MCA cutoff, underwent IR thrombectomy after being intubated. She remained in Afib with notable profound bradycardia while on IV propofol. EKG 8/3 showed Afib RVR, 131 bpm, ST depression in lateral leads, LVH. Repeat on 8/6 showed Afib RVR 107 bpm, ST changes had resolved. She was treated with IV amiodarone/heparin. She eventually converted SR and maintained on oral amiodarone. She was resumed on Eliquis once cleared by neurology prior to discharge. Echo 8/2 showed EF of 60-65% with hypertrophic cardiomyopathy, g2DD, severely dilated LA and mildly  dilated RA. Due to severe dysphagia required a PEG tube. Seen by [facility] after having a transient cold right foot, but US showed good arterial flow. She was discharged to SNF on amiodarone 400mg  BID x14 days, then 200mg  daily, and Eliquis. Shortly afterwards was readmitted with aspiration PNA, and discharge to CIR. Treated in rehab until discharged to SNF. Amiodarone was reduced to 200mg  daily at discharge.   Presents back to ED on 10/10 with new onset left sided facial drop? and worsened dysphasia. CODE STROKE was called and evaluated by neurology with evolution of the left MCA and ACA territory infarcts since CT one month prior. Recommendations for MRI. She was not a TPA candidate given Eliquis. Labs on admission showed stable electrolytes, hsTn 132>>127 (down from 173 prior admission), WBC 10.7, Hgb 15.5. Cardiology consulted in regards to abnormal EKG showing Afib 87 bpm, ST depression in inferolateral leads, with ST elevation in aVR, v1.  In talking with the patient and her husband at the bedside. He reports only issue noted recently has been her blood pressures, which have been spiking. No reported anginal symptoms, but says they have been treating her for GERD with PPI. She is a difficult historian given her CVA hx. Husband at the bedside gives most of the history.    Past Medical History:  Diagnosis Date   Anxiety    Arthritis    "some in my lower back; probably elbows, knees" (11/18/2017)   Atrial fibrillation (HCC)    Bell's palsy    when pt. was 75  yrs old, when under stress the left side of face will droop.   Complication of anesthesia    "vascular OR 2016; BP bottomed out; couldn't get it regulated; ended up in ICU for DAYS" (11/18/2017)   GERD (gastroesophageal reflux disease)    History of kidney stones    Hypertension    Hypertrophic cardiomyopathy (HCC)    severe LV basilar hypertrophy witn no evidence of significant outflow tract obstruction, EF 65-70%, mild LAE, mild TR, grade 1a  diastolic dysfunction 05/15/10 (Dr. Donato Schultz) (Atrial Septal Hypertrophy pattern)-- Intra-op TEE with dsignificant outflow tract obstruction - AI, MR & TR   Insomnia    Mild aortic sclerosis    Osteopenia    Peripheral vascular disease (HCC)    Syncope    , Vagal    Past Surgical History:  Procedure Laterality Date   AUGMENTATION MAMMAPLASTY Bilateral    BACK SURGERY     CARDIAC CATHETERIZATION N/A 05/07/2016   Procedure: Left Heart Cath and Coronary Angiography;  Surgeon: Kathleene Hazel, MD;  Location: [facility] INVASIVE CV LAB;  Service: Cardiovascular;  Laterality: N/A;   CARDIOVERSION N/A 09/24/2017   Procedure: CARDIOVERSION;  Surgeon: Laurey Morale, MD;  Location: [facility] ENDOSCOPY;  Service: Cardiovascular;  Laterality: N/A;   DILATION AND CURETTAGE OF UTERUS     ENDARTERECTOMY FEMORAL Right 03/02/2015   Procedure: ENDARTERECTOMY RIGHT FEMORAL;  Surgeon: Chuck Hint, MD;  Location[facility] OR;  Service: Vascular;  Laterality: Right;   ESOPHAGOGASTRODUODENOSCOPY (EGD) WITH PROPOFOL N/A 12/01/2020   Procedure: ESOPHAGOGASTRODUODENOSCOPY (EGD) WITH PROPOFOL;  Surgeon: Diamantina Monks, MD;  Location: [facility] ENDOSCOPY;  Service: General;  Laterality: N/A;   FACIAL COSMETIC SURGERY Left 2002   "related to Bell's Palsy @ age 23; left eye/side of face droopy; tried to make area symmetrical"   FEMORAL-POPLITEAL BYPASS GRAFT Right 03/02/2015   Procedure: BYPASS GRAFT FEMORAL-BELOW KNEE POPLITEAL ARTERY;  Surgeon: Chuck Hint, MD;  Location: [facility] OR;  Service: Vascular;  Laterality: Right;   INGUINAL HERNIA REPAIR Bilateral 2002   IR ANGIO INTRA EXTRACRAN SEL COM CAROTID INNOMINATE UNI L MOD SED  11/16/2020   IR CT HEAD LTD  11/13/2020   IR PERCUTANEOUS ART THROMBECTOMY/INFUSION INTRACRANIAL INC DIAG ANGIO  11/13/2020   OVARIAN CYST REMOVAL Left    PEG PLACEMENT N/A 12/01/2020   Procedure: PERCUTANEOUS ENDOSCOPIC GASTROSTOMY (PEG) PLACEMENT;  Surgeon: Diamantina Monks, MD;  Location: [facility]  ENDOSCOPY;  Service: General;  Laterality: N/A;   PERIPHERAL VASCULAR CATHETERIZATION N/A 01/16/2015   Procedure: Abdominal Aortogram;  Surgeon: Chuck Hint, MD;  Location: [facility] INVASIVE CV LAB;  Service: Cardiovascular;  Laterality: N/A;   POSTERIOR LUMBAR FUSION  2015   "have plates and screws in there"   RADIOLOGY WITH ANESTHESIA N/A 11/13/2020   Procedure: IR WITH ANESTHESIA;  Surgeon: Radiologist, Medication, MD;  Location: [facility] OR;  Service: Radiology;  Laterality: N/A;   TONSILLECTOMY       Home Medications:  Prior to Admission medications   Medication Sig Start Date End Date Taking? Authorizing Provider  acetaminophen (TYLENOL) 160 MG/5ML solution Place 20.3 mLs (650 mg total) into feeding tube every 4 (four) hours as needed for mild pain (or temp > 37.5 C (99.5 F)). 12/06/20   Danford, Earl Lites, MD  aluminum-magnesium hydroxide-simethicone (MAALOX) 200-200-20 MG/5ML SUSP Take 30 mLs by mouth every 4 (four) hours as needed (for heartburn up to 72 hours).    [provider]  amiodarone (PACERONE) 200 MG tablet Place 1 tablet (200  mg total) into feeding tube daily. 12/06/20   Danford, Earl Lites, MD  apixaban (ELIQUIS) 5 MG TABS tablet Place 1 tablet (5 mg total) into feeding tube 2 (two) times daily. 12/06/20   Danford, Earl Lites, MD  budesonide (PULMICORT) 0.5 MG/2ML nebulizer solution Take 2 mLs (0.5 mg total) by nebulization 2 (two) times daily. 12/06/20   Danford, Earl Lites, MD  buPROPion (WELLBUTRIN) 75 MG tablet Place 1 tablet (75 mg total) into feeding tube 2 (two) times daily. 12/06/20   Danford, Earl Lites, MD  ezetimibe (ZETIA) 10 MG tablet Place 1 tablet (10 mg total) into feeding tube daily. 12/06/20   Danford, Earl Lites, MD  hydrALAZINE (APRESOLINE) 10 MG tablet Take 10 mg by mouth 3 (three) times daily as needed. SBP>165 or DBP >95    [provider]  ipratropium-albuterol (DUONEB) 0.5-2.5 (3) MG/3ML SOLN Take 3 mLs by nebulization every  4 (four) hours as needed. 01/12/21   Angiulli, Mcarthur Rossetti, PA-C  metoprolol tartrate (LOPRESSOR) 50 MG tablet Place 1 tablet (50 mg total) into feeding tube 2 (two) times daily. 01/12/21   Angiulli, Mcarthur Rossetti, PA-C  pantoprazole sodium (PROTONIX) 40 mg Place 40 mg into feeding tube daily. 01/12/21   Angiulli, Mcarthur Rossetti, PA-C  Water For Irrigation, Sterile (FREE WATER) SOLN Place 350 mLs into feeding tube 4 (four) times daily. 01/12/21   Angiulli, Mcarthur Rossetti, PA-C  Water For Irrigation, Sterile (FREE WATER) SOLN Place 250 mLs into feeding tube 6 (six) times daily. 01/14/21   Charlton Amor, PA-C    Inpatient Medications: Scheduled Meds:  Continuous Infusions:  PRN Meds: labetalol  Allergies:    Allergies  Allergen Reactions   Amoxicillin Other (See Comments)    UTI Has patient had a PCN reaction causing immediate rash, facial/tongue/throat swelling, SOB or lightheadedness with hypotension: No Has patient had a PCN reaction causing severe rash involving mucus membranes or skin necrosis: No Has patient had a PCN reaction that required hospitalization: No Has patient had a PCN reaction occurring within the last 10 years: Yes--UTI ONLY If all of the above answers are "NO", then may proceed with Cephalosporin use.    Atenolol Cough   Crestor [Rosuvastatin Calcium] Other (See Comments)    Muscle aches   Pravastatin Other (See Comments)    Muscle aches   Sulfa Antibiotics Nausea Only   Codeine     hallucinations    Social History:   Social History   Socioeconomic History   Marital status: Married    Spouse name: Not on file   Number of children: Not on file   Years of education: Not on file   Highest education level: Not on file  Occupational History   Not on file  Tobacco Use   Smoking status: Former    Packs/day: 1.00    Years: 50.00    Pack years: 50.00    Types: Cigarettes    Quit date: 12/15/2014    Years since quitting: 6.1   Smokeless tobacco: Never  Vaping Use   Vaping  Use: Never used  Substance and Sexual Activity   Alcohol use: Yes    Comment: 11/18/2017 "might have a couple glasses of wine/month; if that"   Drug use: Not on file   Sexual activity: Not Currently  Other Topics Concern   Not on file  Social History Narrative   Not on file   Social Determinants of Health   Financial Resource Strain: Not on file  Food Insecurity: Not  on file  Transportation Needs: Not on file  Physical Activity: Not on file  Stress: Not on file  Social Connections: Not on file  Intimate Partner Violence: Not on file    Family History:    Family History  Problem Relation Age of Onset   Liver cancer Mother    Cancer Mother        Liver   Hypertension Mother    Lung cancer Father    Cancer Father        Lung   Breast cancer Sister    Cancer Sister        Breast     ROS:  Please see the history of present illness.  All other ROS reviewed and negative.     Physical Exam/Data:   Vitals:   01/22/21 0706 01/22/21 0730 01/22/21 0801 01/22/21 0815  BP: (!) 206/123 (!) 179/107 (!) 182/111 (!) 163/106  Pulse:  83 77 79  Resp:  (!) Temp:   98 F (36.7 C)   TempSrc:   Oral   SpO2: (!) 87% 95% 98% 100%   No intake or output data in the 24 hours ending 01/22/21 1007 Last 3 Weights 01/18/2021 01/16/2021 01/14/2021  Weight (lbs) 99 lb 1.6 oz 99 lb 96 lb 12.5 oz  Weight (kg) 44.951 kg 44.906 kg 43.9 kg     There is no height or weight on file to calculate BMI.  General:  Thin, frail older female HEENT: normal Neck: no JVD Vascular: No carotid bruits Cardiac:  normal S1, S2; Irreg Irreg; no murmur  Lungs:  diminished in bases Abd: soft, nontender, no hepatomegaly  Ext: no edema Musculoskeletal:  RU/RL extremities are flaccid, weak on the left Skin: warm and dry  Neuro:  CNs 2-12 intact, no focal abnormalities noted Psych:  Normal affect   EKG:  The EKG was personally reviewed and demonstrates:  Afib 87 bpm, ST depression in inferolateral  leads, with ST elevation in aVR, v1. Telemetry:  Telemetry was personally reviewed and demonstrates:  Afib rate controlled  Relevant CV Studies:  Echo: 11/14/20  IMPRESSIONS     1. There is no evidence of systolic anterior motion of the mitral valve  or true LV outflow obstruction on this study. LVOT velocities are mildly  elevated at 2 m/s. Left ventricular ejection fraction, by estimation, is  60 to 65%. The left ventricle has  low normal function. The left ventricle has no regional wall motion  abnormalities. There is moderate concentric left ventricular hypertrophy.  Left ventricular diastolic parameters are consistent with Grade II  diastolic dysfunction (pseudonormalization).  Elevated left atrial pressure.   2. Right ventricular systolic function is normal. The right ventricular  size is normal.   3. Left atrial size was severely dilated.   4. Right atrial size was mildly dilated.   5. The mitral valve is degenerative. Mild to moderate mitral valve  regurgitation.   6. The aortic valve is tricuspid. There is moderate calcification of the  aortic valve. There is mild thickening of the aortic valve. Aortic valve  regurgitation is mild. Mild to moderate aortic valve  sclerosis/calcification is present, without any  evidence of aortic stenosis.   7. The inferior vena cava is dilated in size with <50% respiratory  variability, suggesting right atrial pressure of 15 mmHg.   Comparison(s): Prior images reviewed side by side. The left ventricular  diastolic function is significantly worse. Elevated right and left atrial  pressures on the  current study.   Cath: 04/2016  Mid RCA lesion, 20 %stenosed. 2nd Mrg lesion, 20 %stenosed. Prox Cx to Mid Cx lesion, 30 %stenosed. Mid LAD lesion, 60 %stenosed.   1. Mild to moderate non-obstructive CAD 2. The LAD is a large caliber vessel that reaches the apex. The mid LAD has a moderate eccentric stenosis just after the moderate caliber  diagonal branch. Angiographically this appears to be moderate.  FFR of this lesion suggests the stenosis is not flow limiting. (FFR 0.82-0.85).  3. The RCA and Circumflex have minor disease.  4. Abnormal LV pressures c/w HOCM.    Recommendations: Medical management of moderate non-obstructive CAD.   Laboratory Data:  High Sensitivity Troponin:   Recent Labs  Lab 01/22/21 0651  TROPONINIHS 132*     Chemistry Recent Labs  Lab 01/22/21 0651 01/22/21 0658  NA 139 138  K 4.3 3.9  CL 98 98  CO2 33*  --   GLUCOSE 104* 106*  BUN 16 19  CREATININE 0.98 0.90  CALCIUM 9.0  --   GFRNONAA >60  --   ANIONGAP 8  --     Recent Labs  Lab 01/22/21 0651  PROT 6.1*  ALBUMIN 3.4*  AST 39  ALT 38  ALKPHOS 68  BILITOT 1.3*   Lipids No results for input(s): CHOL, TRIG, HDL, LABVLDL, LDLCALC, CHOLHDL in the last 168 hours.  Hematology Recent Labs  Lab 01/22/21 0651 01/22/21 0658  WBC 10.7*  --   RBC 5.02  --   HGB 15.5* 16.7*  HCT 48.4* 49.0*  MCV 96.4  --   MCH 30.9  --   MCHC 32.0  --   RDW 14.9  --   PLT 237  --    Thyroid No results for input(s): TSH, FREET4 in the last 168 hours.  BNPNo results for input(s): BNP, PROBNP in the last 168 hours.  DDimer No results for input(s): DDIMER in the last 168 hours.   Radiology/Studies:  CT HEAD CODE STROKE WO CONTRAST  Result Date: 01/22/2021 CLINICAL DATA:  Code stroke. 75 year old female with neurologic deficit. Left ACA and MCA territory infarcts in August. EXAM: CT HEAD WITHOUT CONTRAST TECHNIQUE: Contiguous axial images were obtained from the base of the skull through the vertex without intravenous contrast. COMPARISON:  Brain MRI 11/14/2020, head CT 11/13/2020. FINDINGS: Brain: Hypodensity in the left MCA and ACA territories corresponding to restricted diffusion last month, resembling developing encephalomalacia. No new areas of cytotoxic edema identified. Heterogeneity in the right deep gray matter nuclei and cerebellum  compatible with small chronic infarcts appears stable. Additional patchy bilateral white matter hypodensity appears stable to FLAIR signal abnormality last month. No midline shift, ventriculomegaly, mass effect, evidence of mass lesion, intracranial hemorrhage or evidence of cortically based acute infarction. Vascular: Calcified atherosclerosis at the skull base. No new hyperdense vessel identified. Skull: No acute osseous abnormality identified. Sinuses/Orbits: Visualized paranasal sinuses and mastoids are stable and well aerated. Other: Stable visible face, orbit, and scalp soft tissues. ASPECTS [facility] Stroke Program Early CT Score) Total score (0-10 with 10 being normal): 10, when allowing for evolution of left MCA and ACA infarcts since August. IMPRESSION: 1. Expected evolution of the left MCA and ACA territory infarcts since last month. Underlying chronic small vessel disease. 2. No new cortically based infarct or acute intracranial hemorrhage identified. ASPECTS 10. 3. These results were communicated to Drs. Lindzen and Collins at [employer] am on 01/22/2021 by text page via the [employer] system. Electronically Signed  By: Odessa Fleming M.D.   On: 01/22/2021 07:13   CT ANGIO HEAD NECK W WO CM W PERF (CODE STROKE)  Result Date: 01/22/2021 CLINICAL DATA:  75 year old female code stroke. Status post left M1 occlusion, left ACA stenosis, ACA and MCA infarcts in August. EXAM: CT ANGIOGRAPHY HEAD AND NECK CT PERFUSION BRAIN TECHNIQUE: Multidetector CT imaging of the head and neck was performed using the standard protocol during bolus administration of intravenous contrast. Multiplanar CT image reconstructions and MIPs were obtained to evaluate the vascular anatomy. Carotid stenosis measurements (when applicable) are obtained utilizing NASCET criteria, using the distal internal carotid diameter as the denominator. Multiphase CT imaging of the brain was performed following IV bolus contrast injection. Subsequent  parametric perfusion maps were calculated using RAPID software. CONTRAST:  100 mL Omnipaque 350 COMPARISON:  Plain head CT 0659 hours today.  Brain MRI 11/14/2020. CTA head and neck 11/13/2020. FINDINGS: CT Brain Perfusion Findings: ASPECTS: 10 (of all vein left MCA and ACA infarcts since August). CBF (<30%) Volume: 0 Perfusion (Tmax>6.0s) volume: 0 Mismatch Volume: Not applicable Infarction Location:Not applicable CTA NECK Skeleton: Stable.  No acute osseous abnormality identified. Upper chest: Stable upper lung septal thickening with mild to moderate centrilobular emphysema. No superior mediastinal lymphadenopathy identified. Other neck: Stable. Aortic arch: Calcified aortic atherosclerosis. Three vessel arch configuration better demonstrated on the August CTA. Right carotid system: Visible brachiocephalic artery and right CCA origin are patent with plaque but no significant stenosis. 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 As before, no right ICA reconstitution to the skull base. Left carotid system: Stable left CCA plaque without stenosis. Bulky calcified plaque at the left ICA origin and bulb is stable with stenosis numerically estimated at 70 % with respect to the distal vessel. The left ICA remains patent to the skull base without additional stenosis. Vertebral arteries: Stable proximal right subclavian and right vertebral artery plaque with moderate stenosis of the non dominant right vertebral artery origin. The right vertebral remains patent to the skull base, diminutive, but with no additional stenosis. Stable proximal left subclavian artery plaque without stenosis. Mild calcified plaque at the left vertebral artery origin without stenosis. Dominant left vertebral with tortuous V1 segment. The left vertebral remains patent to the skull base without stenosis. CTA HEAD Posterior circulation: Non dominant right vertebral artery continues to  the vertebrobasilar junction without stenosis. Right AICA appears dominant and patent. Dominant left vertebral V4 segment calcified plaque with mild to moderate stenosis appears stable (series 7, image 2 95). Stenosis is proximal to the normal left PICA origin and vertebrobasilar junction. Patent basilar artery with mild irregularity but no stenosis. Patent SCA and left PCA origins. Fetal type right PCA origin but with patent communication to the right SCA as before. Left posterior communicating is diminutive or absent. Bilateral PCA branches are stable and within normal limits. Anterior circulation: Right ICA terminus reconstituted from the posterior communicating artery as before. No right ICA siphon enhancement. Right MCA and ACA origins remain patent and within normal limits. Left ICA siphon calcified plaque with mild to moderate supraclinoid stenosis is stable. Patent left MCA and ACA origins. Normal anterior communicating artery. ACA A2 segments are patent without stenosis. Improved appearance of distal left ACA branches since August. No discrete branch occlusion now. Left MCA M1 segment has recanalized and is patent to the trifurcation without stenosis. Some left MCA branches are attenuated concordant with the recent infarction. No  M2 branch occlusion is evident. Right MCA M1 segment and bifurcation are patent without stenosis. Right MCA branches appear stable and within normal limits. Venous sinuses: Early contrast timing, grossly patent. Anatomic variants: Dominant left vertebral artery. Fetal type right PCA origin. Review of the MIP images confirms the above findings IMPRESSION: 1. Negative for emergent large vessel occlusion and no infarct core or ischemic penumbra detected by CT Perfusion. 2. 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 3. Recanalized and or improved Left MCA and distal Left ACA branches since  August. 4. Stable atherosclerosis notable for: - high-grade proximal Left ICA stenosis estimated at 70%, due to bulky calcified plaque. - up to moderate bilateral vertebral artery stenosis (dominant left vertebral V4 segment and non dominant right vertebral artery origin). - mild to moderate supraclinoid Left ICA siphon stenosis due to calcified plaque. 5. Aortic Atherosclerosis (ICD10-I70.0). Emphysema (ICD10-J43.9). Salient findings discussed by telephone with Dr. Caryl Pina on 01/22/2021 at 07:36 . Electronically Signed   By: Odessa Fleming M.D.   On: 01/22/2021 07:41     Assessment and Plan:   JALANI CULLIFER is a 75 y.o. female with a hx of persistent Afib (off OAC 2/2 abd hematoma from coughing), multiple CVAs, HTN, HOCM, GERD, moderate CAD on cath '18, PVD s/p R fem-pop who is being seen 01/22/2021 for the evaluation of abnormal EKG/Elevated troponin at the request of Dr. Jerral Ralph.  Abnormal EKG with mildly elevated troponin/Hx of mild to moderate nonobs CAD: EKG on arrival showed Afib with ST depression in inferolateral leads, elevation in aVR and v1. During prior to admission was noted to have ST depression in lateral leads while in Afib RVR, but resolved on repeat tracing. No reported anginal symptoms. hsTn with flat trend 132>>127. She did have mild to moderate nonobstructive disease on cath 2018. Given flat enzymes, lack of anginal symptoms and recent significant CVA would favor conservative management, though this is limited as well.  -- no ASA with need for Eliquis, intolerant to statins (on zetia), metoprolol  -- check echo  TIA, progressive symptoms of recent left MCA stroke: admission back 11/2020. Notes indicate progressive symptoms at the SNF this morning. Seen by neurology, not a candidate for TPA given Eliquis. Just returned from MRI. Husband did confirm that she has been receiving her Eliquis PTA.  -- management per neurology -- on zetia, no ASA with need for Eliquis   Severe dysphagia  2/2 to CVA: has been working with therapy at facility, but now NPO with need for speech evaluation. Has a PEG as well. Husband reports she has been doing well with meds and apple sauce. PEG mostly used for free water bolus.   Persistent Afib: hx of the same. Tikosyn stopped earlier this year in the setting of OAC being held. Appears she left CIR in SR. In afib on admission currently rate controlled. -- amiodarone  daily -- Eliquis  BID  HTN: husband reports her blood pressures have liable recently. BP was noted in the 200s systolic on EMS arrival. Most recent 150/91.  -- metoprolol  BID  HLD: statin intolerant -- on Zetia  Risk Assessment/Risk Scores:   CHA2DS2-VASc Score = 7  This indicates a 11.2% annual risk of stroke. The patient's score is based upon: CHF History: 0 HTN History: 1 Diabetes History: 0 Stroke History: 2 Vascular Disease History: 1 Age Score: 2 Gender Score: 1   For questions or updates, please  contact [facility] Please consult [email or web address] for contact info under    Signed, Laverda Page, NP  01/22/2021 10:07 AM  I have seen and examined the patient along with Laverda Page, NP.  I have reviewed the chart, notes and new data.  I agree with PA/NP's note.  Key new complaints: history from patient and chart. Aphasic patient. Apparently has had some chest discomfort over last 4-5 days that promptly improved with meds for GERD. Presentation today was for worsening neurological findings. 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 She has HOCM. Key examination changes: dense right hemiparesis, aphasia, irregular rhythm, otw normal CV exam. Key new findings / data: AFib with controlled rate and LVH. There is significant worsening of baseline ST depression in the lateral leads and nonspecific ST elevation in the septal leads and aVR. This is quite similar to ECG from Oct 2021 and May 2022, but  worsened from most recent ECG from 12/13/2019. QTc 500 ms. Troponin elevation is very mild and adynamic.   PLAN: In the absence of active angina or biomarker elevation, a conservative approach is warranted. 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 We are more likely to harm than to help her with aggressive/invasive interventions, unless there is unequivocal evidence of acute coronary insufficiency. Will check an echocardiogram for new wall motion abnormalities.  Little room for more aggressive medical Rx.  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 Adding antiplatelet agents may increase the risk of bleeding complications. 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 We could add PCSK9 inh. - good AFib rate control on beta blocker and amio  Thurmon Fair, MD, [facility] [phone] 01/22/2021, 2:43 PM

## 2021-01-22 NOTE — H&P (Addendum)
History and Physical    Beverly Li:[id number] DOB: Jan 21, 1946 DOA: 01/22/2021  PCP: [name], MD  [facility]  I have personally briefly reviewed patient's old medical records available.   Chief Complaint: Headache, chest pain.  Staff noticed more than usual weakness on the right side.  HPI: Beverly Li is a 75 y.o. female with medical history significant of chronic diastolic heart failure, paroxysmal A. 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 Patient does have baseline complete paresis of the right side, right upper extremity more weaker than the right lower extremity.  She also has left facial palsy.  Patient and husband at the bedside.  Patient tells me that she had a bad headache today morning and it is gone now.  She could not quantify but it has improved.  Patient also noticed retrosternal pain, dull ache that felt like her acid reflux today morning.  Patient unable to tell me whether she is more weaker on the right side.  Her husband on the bedside has not noticed any changes on the weakness, however he thinks her voice might have been less clear today but not exact. Previous records indicate history of sleep apnea, husband denies that.  She is not using CPAP or oxygen.  Patient denies any fever or chills.  Denies any nausea vomiting.  Headache as described above that has improved now.  No recent fever.  No change in bowel and bladder  habits.  Retrosternal chest pain that is improved now.  Denies any abdominal pain.  She is working with rehab at the assisted living place and able to mobilize with a rolling walker and wheelchair.  Husband was planning to take her home. With recent swallow study, she has been on pured diet and taking medications by mouth.  Feeding tube is used for nutrition.  ED Course: Blood pressure fluctuates otherwise stable.  Code stroke was called in the emergency room.  She is already on Eliquis and had taken last dose last night.  Not a candidate for tPA or thrombectomy.  She is on 2 L of oxygen but saturating 100%.  CT head and CT angiogram without any new findings.  She does have extensive intracranial atherosclerotic disease. Seen by neurology.  Recommended TIA work-up. EKG with shaky artifacts , no clear P waves and ST depressions on the anterolateral leads, cardiology consulted.  Comparable to previous EKGs. COVID-19 and influenza negative.  Urinalysis pending. Admission requested due to significant findings.  Review of Systems: all systems are reviewed and pertinent positive as per HPI otherwise rest are negative.    Past Medical History:  Diagnosis Date   Anxiety    Arthritis    "some in my lower back; probably elbows, knees" (11/18/2017)   Atrial fibrillation (HCC)    Bell's palsy    when pt. was 75 yrs old, when under stress the left side of face will droop.   Complication of anesthesia    "vascular OR 2016; BP bottomed out; couldn't get it regulated; ended up in ICU for DAYS" (11/18/2017)   GERD (  gastroesophageal reflux disease)    History of kidney stones    Hypertension    Hypertrophic cardiomyopathy (HCC)    severe LV basilar hypertrophy witn no evidence of significant outflow tract obstruction, EF 65-70%, mild LAE, mild TR, grade 1a diastolic dysfunction 05/15/10 (Dr. Donato Schultz) (Atrial Septal Hypertrophy pattern)-- Intra-op TEE with dsignificant outflow tract obstruction - AI, MR & TR    Insomnia    Mild aortic sclerosis    Osteopenia    Peripheral vascular disease (HCC)    Syncope    , Vagal    Past Surgical History:  Procedure Laterality Date   AUGMENTATION MAMMAPLASTY Bilateral    BACK SURGERY     CARDIAC CATHETERIZATION N/A 05/07/2016   Procedure: Left Heart Cath and Coronary Angiography;  Surgeon: Kathleene Hazel, MD;  Location: [facility] INVASIVE CV LAB;  Service: Cardiovascular;  Laterality: N/A;   CARDIOVERSION N/A 09/24/2017   Procedure: CARDIOVERSION;  Surgeon: Laurey Morale, MD;  Location: [facility] ENDOSCOPY;  Service: Cardiovascular;  Laterality: N/A;   DILATION AND CURETTAGE OF UTERUS     ENDARTERECTOMY FEMORAL Right 03/02/2015   Procedure: ENDARTERECTOMY RIGHT FEMORAL;  Surgeon: Chuck Hint, MD;  Location: [facility] OR;  Service: Vascular;  Laterality: Right;   ESOPHAGOGASTRODUODENOSCOPY (EGD) WITH PROPOFOL N/A 12/01/2020   Procedure: ESOPHAGOGASTRODUODENOSCOPY (EGD) WITH PROPOFOL;  Surgeon: Diamantina Monks, MD;  Location: [facility] ENDOSCOPY;  Service: General;  Laterality: N/A;   FACIAL COSMETIC SURGERY Left 2002   "related to Bell's Palsy @ age 54; left eye/side of face droopy; tried to make area symmetrical"   FEMORAL-POPLITEAL BYPASS GRAFT Right 03/02/2015   Procedure: BYPASS GRAFT FEMORAL-BELOW KNEE POPLITEAL ARTERY;  Surgeon: Chuck Hint, MD;  Location: [facility] OR;  Service: Vascular;  Laterality: Right;   INGUINAL HERNIA REPAIR Bilateral 2002   IR ANGIO INTRA EXTRACRAN SEL COM CAROTID INNOMINATE UNI L MOD SED  11/16/2020   IR CT HEAD LTD  11/13/2020   IR PERCUTANEOUS ART THROMBECTOMY/INFUSION INTRACRANIAL INC DIAG ANGIO  11/13/2020   OVARIAN CYST REMOVAL Left    PEG PLACEMENT N/A 12/01/2020   Procedure: PERCUTANEOUS ENDOSCOPIC GASTROSTOMY (PEG) PLACEMENT;  Surgeon: Diamantina Monks, MD;  Location: [facility] ENDOSCOPY;  Service: General;  Laterality: N/A;   PERIPHERAL VASCULAR CATHETERIZATION N/A 01/16/2015   Procedure: Abdominal Aortogram;  Surgeon: Chuck Hint, MD;  Location: [facility] INVASIVE CV LAB;  Service: Cardiovascular;  Laterality: N/A;   POSTERIOR LUMBAR FUSION  2015   "have plates and screws in there"   RADIOLOGY WITH ANESTHESIA N/A 11/13/2020   Procedure: IR WITH ANESTHESIA;  Surgeon: Radiologist, Medication, MD;  Location: [facility] OR;  Service: Radiology;  Laterality: N/A;   TONSILLECTOMY      Social history   reports that she quit smoking about 6 years ago. Her smoking use included cigarettes. She has a 50.00 pack-year smoking history. She has never used smokeless tobacco. She reports current alcohol use. No history on file for drug use.  Allergies  Allergen Reactions   Amoxicillin Other (See Comments)    UTI Has patient had a PCN reaction causing immediate rash, facial/tongue/throat swelling, SOB or lightheadedness with hypotension: No Has patient had a PCN reaction causing severe rash involving mucus membranes or skin necrosis: No Has patient had a PCN reaction that required hospitalization: No Has patient had a PCN reaction occurring within the last 10 years: Yes--UTI ONLY If all of the above answers are "NO", then may proceed with Cephalosporin use.    Atenolol Cough   Crestor [  Rosuvastatin Calcium] Other (See Comments)    Muscle aches   Pravastatin Other (See Comments)    Muscle aches   Sulfa Antibiotics Nausea Only   Codeine     hallucinations    Family History  Problem Relation Age of Onset   Liver cancer Mother    Cancer Mother        Liver   Hypertension Mother    Lung cancer Father    Cancer Father        Lung   Breast cancer Sister    Cancer Sister        Breast     Prior to Admission medications   Medication Sig Start Date End Date Taking? Authorizing Provider  acetaminophen (TYLENOL) 160 MG/5ML solution Place 20.3 mLs (650 mg total) into feeding tube every 4 (four) hours as needed for mild pain (or temp > 37.5 C (99.5 F)). 12/06/20   Danford, Earl Lites, MD  aluminum-magnesium hydroxide-simethicone  (MAALOX) 200-200-20 MG/5ML SUSP Take 30 mLs by mouth every 4 (four) hours as needed (for heartburn up to 72 hours).    [provider]  amiodarone (PACERONE) 200 MG tablet Place 1 tablet (200 mg total) into feeding tube daily. 12/06/20   Danford, Earl Lites, MD  apixaban (ELIQUIS) 5 MG TABS tablet Place 1 tablet (5 mg total) into feeding tube 2 (two) times daily. 12/06/20   Danford, Earl Lites, MD  budesonide (PULMICORT) 0.5 MG/2ML nebulizer solution Take 2 mLs (0.5 mg total) by nebulization 2 (two) times daily. 12/06/20   Danford, Earl Lites, MD  buPROPion (WELLBUTRIN) 75 MG tablet Place 1 tablet (75 mg total) into feeding tube 2 (two) times daily. 12/06/20   Danford, Earl Lites, MD  ezetimibe (ZETIA) 10 MG tablet Place 1 tablet (10 mg total) into feeding tube daily. 12/06/20   Danford, Earl Lites, MD  hydrALAZINE (APRESOLINE) 10 MG tablet Take 10 mg by mouth 3 (three) times daily as needed. SBP>165 or DBP >95    [provider]  ipratropium-albuterol (DUONEB) 0.5-2.5 (3) MG/3ML SOLN Take 3 mLs by nebulization every 4 (four) hours as needed. 01/12/21   Angiulli, Mcarthur Rossetti, PA-C  metoprolol tartrate (LOPRESSOR) 50 MG tablet Place 1 tablet (50 mg total) into feeding tube 2 (two) times daily. 01/12/21   Angiulli, Mcarthur Rossetti, PA-C  pantoprazole sodium (PROTONIX) 40 mg Place 40 mg into feeding tube daily. 01/12/21   Angiulli, Mcarthur Rossetti, PA-C  Water For Irrigation, Sterile (FREE WATER) SOLN Place 350 mLs into feeding tube 4 (four) times daily. 01/12/21   Angiulli, Mcarthur Rossetti, PA-C  Water For Irrigation, Sterile (FREE WATER) SOLN Place 250 mLs into feeding tube 6 (six) times daily. 01/14/21   Charlton Amor, PA-C    Physical Exam: Vitals:   01/22/21 0706 01/22/21 0730 01/22/21 0801 01/22/21 0815  BP: (!) 206/123 (!) 179/107 (!) 182/111 (!) 163/106  Pulse:  83 77 79  Resp:  (!) 22 19 17   Temp:   98 F (36.7 C)   TempSrc:   Oral   SpO2: (!) 87% 95% 98% 100%    Constitutional:  NAD, chronically sick looking female.  Frail and debilitated.  On 2 L oxygen but saturating well. Vitals:   01/22/21 0706 01/22/21 0730 01/22/21 0801 01/22/21 0815  BP: (!) 206/123 (!) 179/107 (!) 182/111 (!) 163/106  Pulse:  83 77 79  Resp:  (!) 22 19 17   Temp:   98 F (36.7 C)   TempSrc:   Oral  SpO2: (!) 87% 95% 98% 100%   Eyes: PERRL, lids and conjunctivae normal ENMT: Mucous membranes are moist. Posterior pharynx clear of any exudate or lesions.Normal dentition.  Neck: normal, supple, no masses, no thyromegaly Respiratory: Some conducted airway sounds.  No added sounds.   Cardiovascular: Irregularly irregular.  No murmurs / rubs / gallops. No extremity edema. 2+ pedal pulses. No carotid bruits.  Abdomen: no tenderness, no masses palpated. No hepatosplenomegaly. Bowel sounds positive.  PEG tube in place.  Intact and dry. Musculoskeletal: no clubbing / cyanosis. No joint deformity upper and lower extremities. Good ROM, no contractures. Normal muscle tone.  Skin: no rashes, lesions, ulcers. No induration Neurologic: Dysarthria present.  Able to communicate.  Right upper extremity is flaccid.  Right lower extremity flaccid with minimal movement.  Withdraws on pressure.  Left side is normal.  Sensation is normal. Psychiatric: Normal judgment and insight. Alert and oriented x 3.  Anxious mood.  Flat affect.    Labs on Admission: I have personally reviewed following labs and imaging studies  CBC: Recent Labs  Lab 01/22/21 0651 01/22/21 0658  WBC 10.7*  --   NEUTROABS 7.7  --   HGB 15.5* 16.7*  HCT 48.4* 49.0*  MCV 96.4  --   PLT 237  --    Basic Metabolic Panel: Recent Labs  Lab 01/22/21 0651 01/22/21 0658  NA 139 138  K 4.3 3.9  CL 98 98  CO2 33*  --   GLUCOSE 104* 106*  BUN 16 19  CREATININE 0.98 0.90  CALCIUM 9.0  --    GFR: Estimated Creatinine Clearance: 38.4 mL/min (by C-G formula based on SCr of 0.9 mg/dL). Liver Function Tests: Recent Labs  Lab  01/22/21 0651  AST 39  ALT 38  ALKPHOS 68  BILITOT 1.3*  PROT 6.1*  ALBUMIN 3.4*   No results for input(s): LIPASE, AMYLASE in the last 168 hours. No results for input(s): AMMONIA in the last 168 hours. Coagulation Profile: Recent Labs  Lab 01/22/21 0651  INR 1.2   Cardiac Enzymes: No results for input(s): CKTOTAL, CKMB, CKMBINDEX, TROPONINI in the last 168 hours. BNP (last 3 results) No results for input(s): PROBNP in the last 8760 hours. HbA1C: No results for input(s): HGBA1C in the last 72 hours. CBG: Recent Labs  Lab 01/22/21 0651  GLUCAP 110*   Lipid Profile: No results for input(s): CHOL, HDL, LDLCALC, TRIG, CHOLHDL, LDLDIRECT in the last 72 hours. Thyroid Function Tests: No results for input(s): TSH, T4TOTAL, FREET4, T3FREE, THYROIDAB in the last 72 hours. Anemia Panel: No results for input(s): VITAMINB12, FOLATE, FERRITIN, TIBC, IRON, RETICCTPCT in the last 72 hours. Urine analysis:    Component Value Date/Time   COLORURINE AMBER (A) 12/12/2020 2043   APPEARANCEUR HAZY (A) 12/12/2020 2043   LABSPEC 1.020 12/12/2020 2043   PHURINE 6.0 12/12/2020 2043   GLUCOSEU NEGATIVE 12/12/2020 2043   HGBUR NEGATIVE 12/12/2020 2043   BILIRUBINUR NEGATIVE 12/12/2020 2043   KETONESUR NEGATIVE 12/12/2020 2043   PROTEINUR 100 (A) 12/12/2020 2043   UROBILINOGEN 1.0 02/27/2015 1127   NITRITE NEGATIVE 12/12/2020 2043   LEUKOCYTESUR SMALL (A) 12/12/2020 2043    Radiological Exams on Admission: CT HEAD CODE STROKE WO CONTRAST  Result Date: 01/22/2021 CLINICAL DATA:  Code stroke. 75 year old female with neurologic deficit. Left ACA and MCA territory infarcts in August. EXAM: CT HEAD WITHOUT CONTRAST TECHNIQUE: Contiguous axial images were obtained from the base of the skull through the vertex without intravenous contrast. COMPARISON:  Brain MRI  11/14/2020, head CT 11/13/2020. FINDINGS: Brain: Hypodensity in the left MCA and ACA territories corresponding to restricted diffusion  last month, resembling developing encephalomalacia. No new areas of cytotoxic edema identified. Heterogeneity in the right deep gray matter nuclei and cerebellum compatible with small chronic infarcts appears stable. Additional patchy bilateral white matter hypodensity appears stable to FLAIR signal abnormality last month. No midline shift, ventriculomegaly, mass effect, evidence of mass lesion, intracranial hemorrhage or evidence of cortically based acute infarction. Vascular: Calcified atherosclerosis at the skull base. No new hyperdense vessel identified. Skull: No acute osseous abnormality identified. Sinuses/Orbits: Visualized paranasal sinuses and mastoids are stable and well aerated. Other: Stable visible face, orbit, and scalp soft tissues. ASPECTS [facility] Stroke Program Early CT Score) Total score (0-10 with 10 being normal): 10, when allowing for evolution of left MCA and ACA infarcts since August. IMPRESSION: 1. Expected evolution of the left MCA and ACA territory infarcts since last month. Underlying chronic small vessel disease. 2. No new cortically based infarct or acute intracranial hemorrhage identified. ASPECTS 10. 3. These results were communicated to Drs. Lindzen and Collins at [employer] am on 01/22/2021 by text page via the [employer] system. Electronically Signed   By: Odessa Fleming M.D.   On: 01/22/2021 07:13   CT ANGIO HEAD NECK W WO CM W PERF (CODE STROKE)  Result Date: 01/22/2021 CLINICAL DATA:  75 year old female code stroke. Status post left M1 occlusion, left ACA stenosis, ACA and MCA infarcts in August. EXAM: CT ANGIOGRAPHY HEAD AND NECK CT PERFUSION BRAIN TECHNIQUE: Multidetector CT imaging of the head and neck was performed using the standard protocol during bolus administration of intravenous contrast. Multiplanar CT image reconstructions and MIPs were obtained to evaluate the vascular anatomy. Carotid stenosis measurements (when applicable) are obtained utilizing NASCET criteria,  using the distal internal carotid diameter as the denominator. Multiphase CT imaging of the brain was performed following IV bolus contrast injection. Subsequent parametric perfusion maps were calculated using RAPID software. CONTRAST:  100 mL Omnipaque 350 COMPARISON:  Plain head CT 0659 hours today.  Brain MRI 11/14/2020. CTA head and neck 11/13/2020. FINDINGS: CT Brain Perfusion Findings: ASPECTS: 10 (of all vein left MCA and ACA infarcts since August). CBF (<30%) Volume: 0 Perfusion (Tmax>6.0s) volume: 0 Mismatch Volume: Not applicable Infarction Location:Not applicable CTA NECK Skeleton: Stable.  No acute osseous abnormality identified. Upper chest: Stable upper lung septal thickening with mild to moderate centrilobular emphysema. No superior mediastinal lymphadenopathy identified. Other neck: Stable. Aortic arch: Calcified aortic atherosclerosis. Three vessel arch configuration better demonstrated on the August CTA. Right carotid system: Visible brachiocephalic artery and right CCA origin are patent with plaque but no significant stenosis. 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 As before, no right ICA reconstitution to the skull base. Left carotid system: Stable left CCA plaque without stenosis. Bulky calcified plaque at the left ICA origin and bulb is stable with stenosis numerically estimated at 70 % with respect to the distal vessel. The left ICA remains patent to the skull base without additional stenosis. Vertebral arteries: Stable proximal right subclavian and right vertebral artery plaque with moderate stenosis of the non dominant right vertebral artery origin. The right vertebral remains patent to the skull base, diminutive, but with no additional stenosis. Stable proximal left subclavian artery plaque without stenosis. Mild calcified plaque at the left vertebral artery origin without stenosis. Dominant left vertebral with tortuous  V1 segment. The left vertebral  remains patent to the skull base without stenosis. CTA HEAD Posterior circulation: Non dominant right vertebral artery continues to the vertebrobasilar junction without stenosis. Right AICA appears dominant and patent. Dominant left vertebral V4 segment calcified plaque with mild to moderate stenosis appears stable (series 7, image 2 95). Stenosis is proximal to the normal left PICA origin and vertebrobasilar junction. Patent basilar artery with mild irregularity but no stenosis. Patent SCA and left PCA origins. Fetal type right PCA origin but with patent communication to the right SCA as before. Left posterior communicating is diminutive or absent. Bilateral PCA branches are stable and within normal limits. Anterior circulation: Right ICA terminus reconstituted from the posterior communicating artery as before. No right ICA siphon enhancement. Right MCA and ACA origins remain patent and within normal limits. Left ICA siphon calcified plaque with mild to moderate supraclinoid stenosis is stable. Patent left MCA and ACA origins. Normal anterior communicating artery. ACA A2 segments are patent without stenosis. Improved appearance of distal left ACA branches since August. No discrete branch occlusion now. Left MCA M1 segment has recanalized and is patent to the trifurcation without stenosis. Some left MCA branches are attenuated concordant with the recent infarction. No M2 branch occlusion is evident. Right MCA M1 segment and bifurcation are patent without stenosis. Right MCA branches appear stable and within normal limits. Venous sinuses: Early contrast timing, grossly patent. Anatomic variants: Dominant left vertebral artery. Fetal type right PCA origin. Review of the MIP images confirms the above findings IMPRESSION: 1. Negative for emergent large vessel occlusion and no infarct core or ischemic penumbra detected by CT Perfusion. 2. 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 3. Recanalized and or improved Left MCA and distal Left ACA branches since August. 4. Stable atherosclerosis notable for: - high-grade proximal Left ICA stenosis estimated at 70%, due to bulky calcified plaque. - up to moderate bilateral vertebral artery stenosis (dominant left vertebral V4 segment and non dominant right vertebral artery origin). - mild to moderate supraclinoid Left ICA siphon stenosis due to calcified plaque. 5. Aortic Atherosclerosis (ICD10-I70.0). Emphysema (ICD10-J43.9). Salient findings discussed by telephone with Dr. Caryl Pina on 01/22/2021 at 07:36 . Electronically Signed   By: Odessa Fleming M.D.   On: 01/22/2021 07:41    EKG: Independently reviewed.  Shows shaky artifacts.  No clear P waves.  ST depressions anterolateral leads some of them comparable to previous EKGs.  Assessment/Plan Principal Problem:   Acute ischemic left MCA stroke (HCC) Active Problems:   Hyperlipidemia   PAD (peripheral artery disease) (HCC)   Obstructive hypertrophic cardiomyopathy (HCC)   Paroxysmal atrial fibrillation (HCC)   Essential hypertension   Dysphagia, post-stroke     1.  TIA, progression of recent acute ischemic left MCA stroke: Rule out other acute medical conditions worsening symptomatology. Admit to monitored unit because of severity of symptoms. Neurochecks and vital signs as per stroke protocol. Patient was not a TPA and vascular intervention candidate because of being on Eliquis. Diet, NPO.  Start tube feeding.  Medications through the tube. Antiplatelets, already on Eliquis. Statin, allergy to statin.  She will resume Zetia. Blood pressure goals, systolic less than 180, diastolic less than 110.  Resume metoprolol.  As needed labetalol. Consultations, neurology, speech, PT OT MRI of the brain ordered. CT angiogram head and neck consistent with stable atherosclerotic disease. 2D  echocardiogram, recently done.  Already on anticoagulation. COVID-19 and influenza negative. Will check UA  to rule out UTI.  Patient does not have any dysuria symptoms.  2.  Dysphagia secondary to stroke: N.p.o. for now.  Speech evaluation.  Consult nutrition to resume tube feeding.  Resume all medications including Eliquis through the PEG tube.  3.  Abnormal EKG/paroxysmal A. fib: Cardiology to evaluate.  Rate controlled on metoprolol, amiodarone and therapeutic on Eliquis.  Continue.  Will check troponin to rule out significant ischemia.  4.  Essential hypertension: Poorly controlled.  Modified blood pressure control suggested to keep blood pressures less than 180 due to recent stroke.  Resume home metoprolol.  As needed labetalol.  If persistently high, will benefit with additional dose of antihypertensives.  5.  Emphysema: Recently diagnosed.  Not on oxygen at home.  On bronchodilator therapy.  6.  History of GERD/epigastric pain: She is on Protonix 40 mg daily.  May benefit with increased dose of PPI.  Will increase to 40 mg twice daily through the tube.   DVT prophylaxis: Eliquis Code Status: Full code Family Communication: Husband at bedside Disposition Plan: Skilled nursing facility Consults called: Neurology, cardiology Admission status: Telemetry.  Observation.   Dorcas Carrow MD [facility] Pager [phone]

## 2021-01-22 NOTE — ED Triage Notes (Signed)
Pt BIB EMS from [facility] for stroke symptoms. LKW was 0300 and she woke up at 0530 with a severe headache with L sided facial and increases slurred speech. Baseline R sided deficit and slow speech from a former stroke.    210/140 122 CBG 98% 4L Julian  HR 87

## 2021-01-22 NOTE — Code Documentation (Addendum)
Stroke Response Nurse Documentation Code Documentation- Addendum  Care/Plan:  BP goal: <180/110 Q2H NIHSS and VS   Bedside handoff with ED RN Annice Pih.    Arleigh Odowd L Buck Mcaffee  Rapid Response RN

## 2021-01-22 NOTE — Code Documentation (Signed)
Stroke Response Nurse Documentation Code Documentation  Beverly Li is a 75 y.o. female arriving to [facility] ED via [facility] on 10/10 with past medical hx of Left CVA with right sided weakness, afib on eliquis, HTN, HLD. On Eliquis (apixaban) daily. Code stroke was activated by EMS.   Patient from SNF where she was LKW at 0300 and now complaining of worsening right sided weakness and aphasia.   Stroke team at the bedside on patient arrival. Labs drawn and patient cleared for CT by Dr. Clayborne Dana. Patient to CT with team. NIHSS 21, see documentation for details and code stroke times. Patient with disoriented, not following commands, right facial droop, right arm weakness, right leg weakness, right decreased sensation, Global aphasia , dysarthria , and Sensory  neglect on exam. The following imaging was completed: CT, CTA head and neck, CTP. Patient is not a candidate for IV Thrombolytic due to on Eliquis.   Bedside handoff with ED RN Fredric Mare.    Rose Fillers  Rapid Response RN

## 2021-01-23 ENCOUNTER — Observation Stay (HOSPITAL_BASED_OUTPATIENT_CLINIC_OR_DEPARTMENT_OTHER): Payer: Medicare Other

## 2021-01-23 DIAGNOSIS — I4819 Other persistent atrial fibrillation: Secondary | ICD-10-CM | POA: Diagnosis not present

## 2021-01-23 DIAGNOSIS — I739 Peripheral vascular disease, unspecified: Secondary | ICD-10-CM | POA: Diagnosis not present

## 2021-01-23 DIAGNOSIS — R4781 Slurred speech: Secondary | ICD-10-CM | POA: Diagnosis not present

## 2021-01-23 DIAGNOSIS — R9431 Abnormal electrocardiogram [ECG] [EKG]: Secondary | ICD-10-CM

## 2021-01-23 DIAGNOSIS — I499 Cardiac arrhythmia, unspecified: Secondary | ICD-10-CM | POA: Diagnosis not present

## 2021-01-23 DIAGNOSIS — Z743 Need for continuous supervision: Secondary | ICD-10-CM | POA: Diagnosis not present

## 2021-01-23 DIAGNOSIS — I10 Essential (primary) hypertension: Secondary | ICD-10-CM

## 2021-01-23 DIAGNOSIS — R531 Weakness: Secondary | ICD-10-CM | POA: Diagnosis not present

## 2021-01-23 DIAGNOSIS — I480 Paroxysmal atrial fibrillation: Secondary | ICD-10-CM

## 2021-01-23 DIAGNOSIS — I421 Obstructive hypertrophic cardiomyopathy: Secondary | ICD-10-CM | POA: Diagnosis not present

## 2021-01-23 DIAGNOSIS — G8190 Hemiplegia, unspecified affecting unspecified side: Secondary | ICD-10-CM | POA: Diagnosis not present

## 2021-01-23 DIAGNOSIS — I63512 Cerebral infarction due to unspecified occlusion or stenosis of left middle cerebral artery: Secondary | ICD-10-CM | POA: Diagnosis not present

## 2021-01-23 LAB — ECHOCARDIOGRAM COMPLETE
Area-P 1/2: 4.65 cm2
Calc EF: 57.4 %
MV VTI: 1.52 cm2
P 1/2 time: 1128 msec
S' Lateral: 2.3 cm
Single Plane A2C EF: 59.3 %
Single Plane A4C EF: 56.8 %

## 2021-01-23 LAB — COMPREHENSIVE METABOLIC PANEL
ALT: 34 U/L (ref 0–44)
AST: 28 U/L (ref 15–41)
Albumin: 3.1 g/dL — ABNORMAL LOW (ref 3.5–5.0)
Alkaline Phosphatase: 57 U/L (ref 38–126)
Anion gap: 13 (ref 5–15)
BUN: 14 mg/dL (ref 8–23)
CO2: 28 mmol/L (ref 22–32)
Calcium: 8.7 mg/dL — ABNORMAL LOW (ref 8.9–10.3)
Chloride: 94 mmol/L — ABNORMAL LOW (ref 98–111)
Creatinine, Ser: 0.85 mg/dL (ref 0.44–1.00)
GFR, Estimated: >60 mL/min (ref >60)
Glucose, Bld: 85 mg/dL (ref 70–99)
Potassium: 3.6 mmol/L (ref 3.5–5.1)
Sodium: 135 mmol/L (ref 135–145)
Total Bilirubin: 1.5 mg/dL — ABNORMAL HIGH (ref 0.3–1.2)
Total Protein: 5.6 g/dL — ABNORMAL LOW (ref 6.5–8.1)

## 2021-01-23 LAB — GLUCOSE, CAPILLARY
Glucose-Capillary: 78 mg/dL (ref 70–99)
Glucose-Capillary: 124 mg/dL — ABNORMAL HIGH (ref 70–99)
Glucose-Capillary: 146 mg/dL — ABNORMAL HIGH (ref 70–99)
Glucose-Capillary: 117 mg/dL — ABNORMAL HIGH (ref 70–99)

## 2021-01-23 LAB — CBC
HCT: 45.8 % (ref 36.0–46.0)
Hemoglobin: 14.5 g/dL (ref 12.0–15.0)
MCH: 30.1 pg (ref 26.0–34.0)
MCHC: 31.7 g/dL (ref 30.0–36.0)
MCV: 95 fL (ref 80.0–100.0)
Platelets: 195 10*3/uL (ref 150–400)
RBC: 4.82 MIL/uL (ref 3.87–5.11)
RDW: 14.6 % (ref 11.5–15.5)
WBC: 8.9 10*3/uL (ref 4.0–10.5)
nRBC: 0 % (ref 0.0–0.2)

## 2021-01-23 LAB — PHOSPHORUS: Phosphorus: 4.1 mg/dL (ref 2.5–4.6)

## 2021-01-23 LAB — MAGNESIUM: Magnesium: 2 mg/dL (ref 1.7–2.4)

## 2021-01-23 MED ORDER — BUDESONIDE 0.5 MG/2ML IN SUSP: 0.5000 mg | Freq: Two times a day (BID) | RESPIRATORY_TRACT | Status: DC

## 2021-01-23 MED ORDER — JEVITY 1.2 CAL PO LIQD
356.0000 mL | ORAL | Status: DC
  Administered 2021-01-23: 356 mL

## 2021-01-23 MED ORDER — PROSOURCE TF PO LIQD
45.0000 mL | Freq: Two times a day (BID) | ORAL | Status: DC
  Administered 2021-01-23: 45 mL
  Filled 2021-01-23: qty 45

## 2021-01-23 MED ORDER — BOOST / RESOURCE BREEZE PO LIQD CUSTOM: 1.0000 | Freq: Three times a day (TID) | ORAL | Status: DC

## 2021-01-23 MED ORDER — JEVITY 1.2 CAL PO LIQD: 356.0000 mL | Freq: Three times a day (TID) | ORAL | Status: DC | PRN

## 2021-01-23 NOTE — TOC Transition Note (Signed)
Transition of Care [facility]) - CM/SW Discharge Note   Patient Details  Name: JACAYLA NORDELL MRN: [id number] Date of Birth: Nov 29, 1945  Transition of Care [facility]) CM/SW Contact:  Baldemar Lenis, LCSW Phone Number: 01/23/2021, 3:03 PM   Clinical Narrative:   Nurse to call report to [phone], [facility] Room 116.    Final next level of care: Skilled Nursing Facility Barriers to Discharge: Barriers Resolved   Patient Goals and CMS Choice        Discharge Placement              Patient chooses bed at: [facility] Patient to be transferred to facility by: [facility] Name of family member notified: Bill Patient and family notified of of transfer: 01/23/21  Discharge Plan and Services                                     Social Determinants of Health (SDOH) Interventions     Readmission Risk Interventions No flowsheet data found.

## 2021-01-23 NOTE — Evaluation (Signed)
Clinical/Bedside Swallow Evaluation Patient Details  Name: Beverly Li MRN: [id number] Date of Birth: 02/19/1946  Today's Date: 01/23/2021 Time: SLP Start Time (ACUTE ONLY): 0745 SLP Stop Time (ACUTE ONLY): 0816 SLP Time Calculation (min) (ACUTE ONLY): 31 min  Past Medical History:  Past Medical History:  Diagnosis Date   Anxiety    Arthritis    "some in my lower back; probably elbows, knees" (11/18/2017)   Atrial fibrillation (HCC)    Bell's palsy    when pt. was 75 yrs old, when under stress the left side of face will droop.   Complication of anesthesia    "vascular OR 2016; BP bottomed out; couldn't get it regulated; ended up in ICU for DAYS" (11/18/2017)   GERD (gastroesophageal reflux disease)    History of kidney stones    Hypertension    Hypertrophic cardiomyopathy (HCC)    severe LV basilar hypertrophy witn no evidence of significant outflow tract obstruction, EF 65-70%, mild LAE, mild TR, grade 1a diastolic dysfunction 05/15/10 (Dr. Donato Schultz) (Atrial Septal Hypertrophy pattern)-- Intra-op TEE with dsignificant outflow tract obstruction - AI, MR & TR   Insomnia    Mild aortic sclerosis    Osteopenia    Peripheral vascular disease (HCC)    Syncope    , Vagal   Past Surgical History:  Past Surgical History:  Procedure Laterality Date   AUGMENTATION MAMMAPLASTY Bilateral    BACK SURGERY     CARDIAC CATHETERIZATION N/A 05/07/2016   Procedure: Left Heart Cath and Coronary Angiography;  Surgeon: Kathleene Hazel, MD;  Location: [facility] INVASIVE CV LAB;  Service: Cardiovascular;  Laterality: N/A;   CARDIOVERSION N/A 09/24/2017   Procedure: CARDIOVERSION;  Surgeon: Laurey Morale, MD;  Location: [facility] ENDOSCOPY;  Service: Cardiovascular;  Laterality: N/A;   DILATION AND CURETTAGE OF UTERUS     ENDARTERECTOMY FEMORAL Right 03/02/2015   Procedure: ENDARTERECTOMY RIGHT FEMORAL;  Surgeon: Chuck Hint, MD;  Location: [facility] OR;  Service: Vascular;  Laterality: Right;    ESOPHAGOGASTRODUODENOSCOPY (EGD) WITH PROPOFOL N/A 12/01/2020   Procedure: ESOPHAGOGASTRODUODENOSCOPY (EGD) WITH PROPOFOL;  Surgeon: Diamantina Monks, MD;  Location: [facility] ENDOSCOPY;  Service: General;  Laterality: N/A;   FACIAL COSMETIC SURGERY Left 2002   "related to Bell's Palsy @ age 9; left eye/side of face droopy; tried to make area symmetrical"   FEMORAL-POPLITEAL BYPASS GRAFT Right 03/02/2015   Procedure: BYPASS GRAFT FEMORAL-BELOW KNEE POPLITEAL ARTERY;  Surgeon: Chuck Hint, MD;  Location: [facility] OR;  Service: Vascular;  Laterality: Right;   INGUINAL HERNIA REPAIR Bilateral 2002   IR ANGIO INTRA EXTRACRAN SEL COM CAROTID INNOMINATE UNI L MOD SED  11/16/2020   IR CT HEAD LTD  11/13/2020   IR PERCUTANEOUS ART THROMBECTOMY/INFUSION INTRACRANIAL INC DIAG ANGIO  11/13/2020   OVARIAN CYST REMOVAL Left    PEG PLACEMENT N/A 12/01/2020   Procedure: PERCUTANEOUS ENDOSCOPIC GASTROSTOMY (PEG) PLACEMENT;  Surgeon: Diamantina Monks, MD;  Location: [facility] ENDOSCOPY;  Service: General;  Laterality: N/A;   PERIPHERAL VASCULAR CATHETERIZATION N/A 01/16/2015   Procedure: Abdominal Aortogram;  Surgeon: Chuck Hint, MD;  Location: [facility] INVASIVE CV LAB;  Service: Cardiovascular;  Laterality: N/A;   POSTERIOR LUMBAR FUSION  2015   "have plates and screws in there"   RADIOLOGY WITH ANESTHESIA N/A 11/13/2020   Procedure: IR WITH ANESTHESIA;  Surgeon: Radiologist, Medication, MD;  Location: [facility] OR;  Service: Radiology;  Laterality: N/A;   TONSILLECTOMY     HPI:  75 yo female adm to [facility]  with worsening aphasia, dysarthria and right hemiparesis.  Pt with PMH + for recent left CVA- MRI showed new decreased diffusion of left basal ganglia.  Pt has a PEG tube placed 11/2020.  Swallow and speech evaluation ordered.  Spouse present and reports pt is on a dys2/nectar diet at her facility and was tolerating. He does report pt had a potential aspiration episode with Protonix as it caused her to excessively cough.  Since that  time, he reports pt has been clearing her throat frequently.  Pt also with h/o HTN, Bell's Palsy from 50 years ago causing left facial asymmetry.    Assessment / Plan / Recommendation  Clinical Impression  Patient presents with dysphagia consistent with findings from prior MBS and clinical swallow evaluations.  She continues with impaired labial seal and sensory deficits on right resulting in prolonged oral transiting, impaired mastication, anterior bolus loss and labial retention. Motor planning deficits noted impairing pt's ability to participate in oral motor exam.  Pt self consumed graham cracker, applesauce and nectar thick liquids.  NO indication of aspiration noted - although delayed swallow observed with subtle throat clearing toward end of session.  Did not test pt with thin liquids due to h/o aspiration and current neurological event.  Deferred SLE as pt has not eaten since admission and admits to hunger. She is however able to respond appropriately to questions today with delay.  SLP educated pt and spouse to diet recommendations and compensation strategies using teach back and handouts, provided menu to spouse to order pt's breakfast.  Thanks for this consult. SLP Visit Diagnosis: Dysphagia, oral phase (R13.11)    Aspiration Risk  Mild aspiration risk    Diet Recommendation Dysphagia 2 (Fine chop);Nectar-thick liquid   Liquid Administration via: Cup;Spoon;No straw Medication Administration: Crushed with puree Supervision: Patient able to self feed;Full supervision/cueing for compensatory strategies Compensations: Minimize environmental distractions;Small sips/bites;Slow rate;Monitor for anterior loss Postural Changes: Seated upright at 90 degrees;Remain upright for at least 30 minutes after po intake    Other  Recommendations Oral Care Recommendations: Oral care BID    Recommendations for follow up therapy are one component of a multi-disciplinary discharge planning process, led by  the attending physician.  Recommendations may be updated based on patient status, additional functional criteria and insurance authorization.  Follow up Recommendations        Frequency and Duration min 1 x/week  1 week       Prognosis Prognosis for Safe Diet Advancement: Fair      Swallow Study   General HPI: 75 yo female adm to [facility] with worsening aphasia, dysarthria and right hemiparesis.  Pt with PMH + for recent left CVA- MRI showed new decreased diffusion of left basal ganglia.  Pt has a PEG tube placed 11/2020.  Swallow and speech evaluation ordered.  Spouse present and reports pt is on a dys2/nectar diet at her facility and was tolerating. He does report pt had a potential aspiration episode with Protonix as it caused her to excessively cough.  Since that time, he reports pt has been clearing her throat frequently.  Pt also with h/o HTN, Bell's Palsy from 50 years ago causing left facial asymmetry. Type of Study: Bedside Swallow Evaluation Previous Swallow Assessment: MBS 9/8 recommendation dys1/nectar - thin between meals Diet Prior to this Study: Dysphagia 1 (puree);Nectar-thick liquids Temperature Spikes Noted: No Respiratory Status: Room air History of Recent Intubation: No Behavior/Cognition: Alert;Cooperative Oral Cavity Assessment: Within Functional Limits Oral Care Completed by SLP: No Oral  Cavity - Dentition: Adequate natural dentition Vision: Functional for self-feeding Self-Feeding Abilities: Able to feed self;Needs assist Patient Positioning: Upright in bed Baseline Vocal Quality: Normal Volitional Cough: Cognitively unable to elicit Volitional Swallow: Unable to elicit    Oral/Motor/Sensory Function Overall Oral Motor/Sensory Function: Moderate impairment Facial ROM: Reduced right;Suspected CN VII (facial) dysfunction Facial Symmetry: Abnormal symmetry right;Suspected CN VII (facial) dysfunction----  left facial asymmetry from Bell's palsy Facial Strength:  Reduced right;Suspected CN VII (facial) dysfunction Lingual ROM: Reduced right;Reduced left Lingual Symmetry: Within Functional Limits Lingual Strength: Reduced Velum: Within Functional Limits   Ice Chips Ice chips: Not tested   Thin Liquid Thin Liquid: Not tested (pt on nectar thick liquids at the facility)    Nectar Thick Nectar Thick Liquid: Impaired Presentation: Cup Oral Phase Impairments: Reduced labial seal Oral phase functional implications: Right anterior spillage;Prolonged oral transit Pharyngeal Phase Impairments: Suspected delayed Swallow;Throat Clearing - Delayed   Honey Thick Honey Thick Liquid: Not tested   Puree Puree: Impaired Presentation: Self Fed;Spoon Oral Phase Impairments: Reduced labial seal;Reduced lingual movement/coordination Oral Phase Functional Implications: Prolonged oral transit Pharyngeal Phase Impairments: Suspected delayed Swallow   Solid     Solid: Impaired Presentation: Self Fed Oral Phase Impairments: Impaired mastication;Reduced lingual movement/coordination;Reduced labial seal Oral Phase Functional Implications: Right anterior spillage;Prolonged oral transit Pharyngeal Phase Impairments: Suspected delayed Swallow      Chales Abrahams 01/23/2021,9:14 AM  Rolena Infante, MS [facility] SLP Acute Rehab Services Office [phone] Pager [phone]

## 2021-01-23 NOTE — Discharge Summary (Signed)
Physician Discharge Summary  Beverly Li CZY:[id number] DOB: Aug 03, 1945 DOA: 01/22/2021  PCP: [name], MD  Admit date: 01/22/2021 Discharge date: 01/23/2021  Admitted From: Nursing home Disposition: Nursing home Recommendations for Outpatient Follow-up:  Follow up with PCP in 1-2 weeks Please obtain BMP/CBC in one week Please follow up with [facility]  Home Health: None Equipment/Devices: None Discharge Condition: Stable CODE STATUS: Full code Diet recommendation: Dysphagia 2 finely chopped nectar thick liquid Brief/Interim Summary:Beverly Li is a 75 y.o. female with medical history significant of chronic diastolic heart failure, paroxysmal A. 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 Patient does have baseline complete paresis of the right side, right upper extremity more weaker than the right lower extremity.  She also has left facial palsy.  Patient and husband at the bedside.  Patient tells me that she had a bad headache today morning and it is gone now.  She could not quantify but it has improved.  Patient also noticed retrosternal pain, dull ache that felt like her acid reflux today morning.  Patient unable to tell me whether she is more weaker on the right side.  Her husband on the bedside has not noticed any changes on the weakness, however he thinks her voice might have been less clear today but not exact. Previous records indicate history of sleep apnea,  husband denies that.  She is not using CPAP or oxygen.   Patient denies any fever or chills.  Denies any nausea vomiting.  Headache as described above that has improved now.  No recent fever.  No change in bowel and bladder habits.  Retrosternal chest pain that is improved now.  Denies any abdominal pain.  She is working with rehab at the assisted living place and able to mobilize with a rolling walker and wheelchair.  Husband was planning to take her home. With recent swallow study, she has been on pured diet and taking medications by mouth.  Feeding tube is used for nutrition.   ED Course: Blood pressure fluctuates otherwise stable.  Code stroke was called in the emergency room.  She is already on Eliquis and had taken last dose last night.  Not a candidate for tPA or thrombectomy.  She is on 2 L of oxygen but saturating 100%.  CT head and CT angiogram without any new findings.  She does have extensive intracranial atherosclerotic disease. Seen by neurology.  Recommended TIA work-up. EKG with shaky artifacts , no clear P waves and ST depressions on the anterolateral leads, cardiology consulted.  Comparable to previous EKGs. COVID-19 and influenza negative.  Urinalysis pending. Admission requested due to significant findings.  Discharge Diagnoses:  Principal Problem:   Acute ischemic left MCA stroke (HCC) Active Problems:   Hyperlipidemia   PAD (peripheral artery disease) (HCC)   Obstructive hypertrophic cardiomyopathy (HCC)   Paroxysmal atrial fibrillation (HCC)   Essential hypertension   Dysphagia, post-stroke    1. Stroke:  Likely recrudescence of previous Left MCA and ACA stroke now with new/increased diffusion restriction in the left basal ganglia, internal and external capsules, extending to the caudate  body/corona radiata   MRI of the brain shows new or increased diffusion restriction in the left basal ganglia internal and external capsules extending to the caudate body and corona  radiator compared to the study from 11/14/2020 consistent with acute to subacute infarct.  Additional diffusion restriction in the left MCA and ACA territories consistent with evolving subacute infarct seen on study 11/14/2020.  Petechial hemorrhage noted.  Punctuate chronic microhemorrhage in the left superior frontal Gar[place].  LDL 120 hemoglobin A1c 6.0 Continue Eliquis and Zetia.  She is allergic to multiple statins. Echo repeated this admission shows-ejection fraction 60% left ventricle has normal function.  No regional wall motion abnormalities.  Severe LVH.  No evidence of left ventricular outflow tract obstruction.  No aneurysm. Patient was seen by neurology this admission.    2.  Dysphagia secondary to stroke: Seen by speech therapy she is at mild aspiration risk.  Recommended dysphagia 2 fine  chopped nectar thick liquid.    3.  Abnormal EKG/paroxysmal A. fib: Patient was seen by cardiology this admission.  No new recommendations were made.    4.  Essential hypertension: Continue amiodarone, metoprolol, hydralazine.   5.  Emphysema: Recently diagnosed.  Not on oxygen at home.  On bronchodilator therapy.    Nutrition Problem: Severe Malnutrition Etiology: chronic illness (CHF)    Signs/Symptoms: severe muscle depletion, severe fat depletion     Interventions: Tube feeding, Prostat, Boost Breeze  Estimated body mass index is 17.55 kg/m as calculated from the following:   Height as of 01/18/21: 5\' 3"  (1.6 m).   Weight as of 01/18/21: 45 kg.  Discharge Instructions  Discharge Instructions     Diet - low sodium heart healthy   Complete by: As directed    Increase activity slowly   Complete by: As directed       Allergies as of 01/23/2021       Reactions   Amoxicillin Other (See Comments)   UTI Has patient had a PCN reaction causing immediate rash, facial/tongue/throat swelling, SOB or lightheadedness with hypotension: No Has patient had a PCN reaction causing severe rash  involving mucus membranes or skin necrosis: No Has patient had a PCN reaction that required hospitalization: No Has patient had a PCN reaction occurring within the last 10 years: Yes--UTI ONLY If all of the above answers are "NO", then may proceed with Cephalosporin use.   Atenolol Cough   Crestor [rosuvastatin Calcium] Other (See Comments)   Muscle aches   Pravastatin Other (See Comments)   Muscle aches   Sulfa Antibiotics Nausea Only   Codeine    hallucinations        Medication List     TAKE these medications    acetaminophen 325 MG tablet Commonly known as: TYLENOL Take 650 mg by mouth every 4 (four) hours as needed for mild pain.   aluminum-magnesium hydroxide-simethicone 200-200-20 MG/5ML Susp Commonly known as: MAALOX Take 30 mLs by mouth every 4 (four) hours as needed (for heartburn up to 72 hours).   amiodarone 200 MG tablet Commonly known as: PACERONE Place 1 tablet (200 mg total) into feeding tube daily.   apixaban 5 MG Tabs tablet Commonly known as: ELIQUIS Place 1 tablet (5 mg total) into feeding tube 2 (two) times daily.   budesonide 0.5 MG/2ML nebulizer solution Commonly known as: PULMICORT Take 2 mLs (0.5 mg total) by nebulization 2 (two) times daily.   buPROPion 75 MG tablet Commonly known as: WELLBUTRIN Place 1 tablet (75 mg total) into  feeding tube 2 (two) times daily.   ezetimibe 10 MG tablet Commonly known as: ZETIA Place 1 tablet (10 mg total) into feeding tube daily.   free water Soln Place 250 mLs into feeding tube 6 (six) times daily.   hydrALAZINE 10 MG tablet Commonly known as: APRESOLINE Take 10 mg by mouth 3 (three) times daily as needed. SBP>165 or DBP >95   ipratropium-albuterol 0.5-2.5 (3) MG/3ML Soln Commonly known as: DUONEB Take 3 mLs by nebulization every 4 (four) hours as needed. What changed: reasons to take this   metoprolol tartrate 50 MG tablet Commonly known as: LOPRESSOR Place 1 tablet (50 mg total) into feeding  tube 2 (two) times daily.   pantoprazole sodium 40 mg/20 mL Susp Commonly known as: PROTONIX Place 40 mg into feeding tube 2 (two) times daily.        Follow-up Information     [name], MD Follow up.   Specialty: Internal Medicine Contact information: [address] [place] [address] [phone]         [name], MD .   Specialty: Cardiology Contact information: [phone] [address] [place] [address] [phone]         Duke Salvia, MD .   Specialty: Cardiology Contact information: [phone] [address] [place] [address] [phone]                Allergies  Allergen Reactions   Amoxicillin Other (See Comments)    UTI Has patient had a PCN reaction causing immediate rash, facial/tongue/throat swelling, SOB or lightheadedness with hypotension: No Has patient had a PCN reaction causing severe rash involving mucus membranes or skin necrosis: No Has patient had a PCN reaction that required hospitalization: No Has patient had a PCN reaction occurring within the last 10 years: Yes--UTI ONLY If all of the above answers are "NO", then may proceed with Cephalosporin use.    Atenolol Cough   Crestor [Rosuvastatin Calcium] Other (See Comments)    Muscle aches   Pravastatin Other (See Comments)    Muscle aches   Sulfa Antibiotics Nausea Only   Codeine     hallucinations    Consultations: Neurology and cardiology   Procedures/Studies: MR BRAIN WO CONTRAST  Result Date: 01/22/2021 CLINICAL DATA:  Transient ischemic attack EXAM: MRI HEAD WITHOUT CONTRAST TECHNIQUE: Multiplanar, multiecho pulse sequences of the brain and surrounding structures were obtained without intravenous contrast. COMPARISON:  Same day CT/CTA head and neck, MR head 11/14/2020 FINDINGS: Brain: There is diffusion restriction with associated T2/FLAIR signal abnormality in the left ACA and MCA distribution, some of which was present  on the prior study from 11/14/2020 and has decreased in conspicuity; however, there is increased diffusion restriction in the left basal ganglia, internal and external capsules, extending to the caudate body/corona radiata. Findings suspicious for acute infarct superimposed on evolving subacute infarcts. Curvilinear SWI signal dropout along the left precentral sulcus may reflect petechial hemorrhage. There is an additional focus of SWI signal dropout in the left superior frontal gyrus which may reflect additional petechial hemorrhage versus punctate chronic microhemorrhage. Vascular: The right ICA flow void is absent through the communicating segment, in keeping with the findings on the prior CTA neck. Skull and upper cervical spine: Normal marrow signal. Sinuses/Orbits: There is mild mucosal thickening in the paranasal sinuses. The globes and orbits are unremarkable. Other: None. IMPRESSION: 1. New/increased diffusion restriction in the left basal ganglia, internal and external capsules, extending to  the caudate body/corona radiata compared to the study from 11/14/2020 consistent with acute to subacute infarct. 2. Additional diffusion restriction in the left MCA and ACA territories consistent with evolving subacute infarct as seen on the study from 11/14/2020. 3. Curvilinear SWI signal dropout along the left precentral sulcus consistent with petechial hemorrhage. There is an additional focus of petechial hemorrhage versus punctate chronic microhemorrhage in the left superior frontal gyrus. Electronically Signed   By: Lesia Hausen M.D.   On: 01/22/2021 10:20   ECHOCARDIOGRAM COMPLETE  Result Date: 01/23/2021    ECHOCARDIOGRAM REPORT   Patient Name:   Beverly Li Date of Exam: 01/23/2021 Medical Rec #:  [id number]       Height:       63.0 in Accession #:    [id number]      Weight:       99.1 lb Date of Birth:  05-14-1945       BSA:          1.435 m Patient Age:    75 years        BP:           158/86 mmHg  Patient Gender: F               HR:           80 bpm. Exam Location:  Inpatient Procedure: 2D Echo, Cardiac Doppler and Color Doppler Indications:    Abnormal ECG R94.31  History:        Patient has prior history of Echocardiogram examinations, most                 recent 11/14/2020. CHF, PAD, Arrythmias:Atrial Fibrillation; Risk                 Factors:Sleep Apnea and Hypertension. Hypertrophic                 cardiomyopathy.  Sonographer:    Leta Jungling RDCS Referring Phys: [phone] LINDSAY B ROBERTS IMPRESSIONS  1. Left ventricular ejection fraction, by estimation, is 60%. The left ventricle has normal function. The left ventricle has no regional wall motion abnormalities. There is severe left ventricular hypertrophy. Peak gradient of 2 mm Hg; there is no evidence of LVOT Obstruction in this study. No evidence of apical aneursysm. Left ventricular diastolic parameters are indeterminate.  2. Right ventricular systolic function is mildly reduced. The right ventricular size is normal. There is normal pulmonary artery systolic pressure.  3. Left atrial size was mildly dilated.  4. Right atrial size was moderately dilated.  5. The mitral valve is degenerative. Mild mitral valve regurgitation. The mean mitral valve gradient is 4.0 mmHg with average heart rate of 72 bpm.  6. The aortic valve is tricuspid. There is moderate calcification of the aortic valve. Aortic valve regurgitation is mild. Mild to moderate aortic valve sclerosis/calcification is present, without any evidence of aortic stenosis.  7. Aortic dilatation noted. There is mild dilatation of the ascending aorta, measuring 40 mm. Comparison(s): A prior study was performed on 11/14/2020. No significant change from prior study. FINDINGS  Left Ventricle: Left ventricular ejection fraction, by estimation, is 60%. The left ventricle has normal function. The left ventricle has no regional wall motion abnormalities. The left ventricular internal cavity size was small.  There is severe left ventricular hypertrophy. Left ventricular diastolic parameters are indeterminate. Right Ventricle: The right ventricular size is normal. Right vetricular wall thickness was not well visualized. Right ventricular systolic function is mildly  reduced. There is normal pulmonary artery systolic pressure. The tricuspid regurgitant velocity is 2.62 m/s, and with an assumed right atrial pressure of 8 mmHg, the estimated right ventricular systolic pressure is 35.5 mmHg. Left Atrium: Left atrial size was mildly dilated. Right Atrium: Right atrial size was moderately dilated. Pericardium: There is no evidence of pericardial effusion. Mitral Valve: The mitral valve is degenerative in appearance. Mild mitral valve regurgitation. MV peak gradient, 7.9 mmHg. The mean mitral valve gradient is 4.0 mmHg with average heart rate of 72 bpm. Tricuspid Valve: The tricuspid valve is grossly normal. Tricuspid valve regurgitation is mild . No evidence of tricuspid stenosis. Aortic Valve: The aortic valve is tricuspid. There is moderate calcification of the aortic valve. Aortic valve regurgitation is mild. Aortic regurgitation PHT measures 1128 msec. Mild to moderate aortic valve sclerosis/calcification is present, without any evidence of aortic stenosis. Pulmonic Valve: The pulmonic valve was not well visualized. Pulmonic valve regurgitation is mild to moderate. No evidence of pulmonic stenosis. Aorta: Aortic dilatation noted. There is mild dilatation of the ascending aorta, measuring 40 mm. IAS/Shunts: The atrial septum is grossly normal.  LEFT VENTRICLE PLAX 2D LVIDd:         3.30 cm     Diastology LVIDs:         2.30 cm     LV e' medial:    3.53 cm/s LV PW:         1.30 cm     LV E/e' medial:  27.3 LV IVS:        2.00 cm     LV e' lateral:   5.89 cm/s LVOT diam:     2.00 cm     LV E/e' lateral: 16.4 LV SV:         35 LV SV Index:   25 LVOT Area:     3.14 cm  LV Volumes (MOD) LV vol d, MOD A2C: 69.1 ml LV vol d, MOD  A4C: 81.8 ml LV vol s, MOD A2C: 28.1 ml LV vol s, MOD A4C: 35.3 ml LV SV MOD A2C:     41.0 ml LV SV MOD A4C:     81.8 ml LV SV MOD BP:      43.1 ml RIGHT VENTRICLE RV S prime:     8.93 cm/s TAPSE (M-mode): 1.1 cm LEFT ATRIUM             Index        RIGHT ATRIUM           Index LA diam:        4.20 cm 2.93 cm/m   RA Area:     22.10 cm LA Vol (A2C):   43.5 ml 30.32 ml/m  RA Volume:   63.60 ml  44.33 ml/m LA Vol (A4C):   56.0 ml 39.03 ml/m LA Biplane Vol: 53.5 ml 37.29 ml/m  AORTIC VALVE LVOT Vmax:   76.00 cm/s LVOT Vmean:  52.500 cm/s LVOT VTI:    0.112 m AI PHT:      1128 msec  AORTA Ao Root diam: 3.40 cm Ao Asc diam:  3.95 cm MITRAL VALVE               TRICUSPID VALVE MV Area (PHT): 4.65 cm    TR Peak grad:   27.5 mmHg MV Area VTI:   1.52 cm    TR Vmax:        262.00 cm/s MV Peak grad:  7.9 mmHg MV Mean grad:  4.0 mmHg    SHUNTS MV Vmax:       1.40 m/s    Systemic VTI:  0.11 m MV Vmean:      88.9 cm/s   Systemic Diam: 2.00 cm MV Decel Time: 163 msec MV E velocity: 96.43 cm/s Riley Lam MD Electronically signed by Riley Lam MD Signature Date/Time: 01/23/2021/10:36:35 AM    Final    CT HEAD CODE STROKE WO CONTRAST  Result Date: 01/22/2021 CLINICAL DATA:  Code stroke. 75 year old female with neurologic deficit. Left ACA and MCA territory infarcts in August. EXAM: CT HEAD WITHOUT CONTRAST TECHNIQUE: Contiguous axial images were obtained from the base of the skull through the vertex without intravenous contrast. COMPARISON:  Brain MRI 11/14/2020, head CT 11/13/2020. FINDINGS: Brain: Hypodensity in the left MCA and ACA territories corresponding to restricted diffusion last month, resembling developing encephalomalacia. No new areas of cytotoxic edema identified. Heterogeneity in the right deep gray matter nuclei and cerebellum compatible with small chronic infarcts appears stable. Additional patchy bilateral white matter hypodensity appears stable to FLAIR signal abnormality last month.  No midline shift, ventriculomegaly, mass effect, evidence of mass lesion, intracranial hemorrhage or evidence of cortically based acute infarction. Vascular: Calcified atherosclerosis at the skull base. No new hyperdense vessel identified. Skull: No acute osseous abnormality identified. Sinuses/Orbits: Visualized paranasal sinuses and mastoids are stable and well aerated. Other: Stable visible face, orbit, and scalp soft tissues. ASPECTS [facility] Stroke Program Early CT Score) Total score (0-10 with 10 being normal): 10, when allowing for evolution of left MCA and ACA infarcts since August. IMPRESSION: 1. Expected evolution of the left MCA and ACA territory infarcts since last month. Underlying chronic small vessel disease. 2. No new cortically based infarct or acute intracranial hemorrhage identified. ASPECTS 10. 3. These results were communicated to Drs. Lindzen and Collins at [employer] am on 01/22/2021 by text page via the [employer] system. Electronically Signed   By: Odessa Fleming M.D.   On: 01/22/2021 07:13   CT ANGIO HEAD NECK W WO CM W PERF (CODE STROKE)  Result Date: 01/22/2021 CLINICAL DATA:  75 year old female code stroke. Status post left M1 occlusion, left ACA stenosis, ACA and MCA infarcts in August. EXAM: CT ANGIOGRAPHY HEAD AND NECK CT PERFUSION BRAIN TECHNIQUE: Multidetector CT imaging of the head and neck was performed using the standard protocol during bolus administration of intravenous contrast. Multiplanar CT image reconstructions and MIPs were obtained to evaluate the vascular anatomy. Carotid stenosis measurements (when applicable) are obtained utilizing NASCET criteria, using the distal internal carotid diameter as the denominator. Multiphase CT imaging of the brain was performed following IV bolus contrast injection. Subsequent parametric perfusion maps were calculated using RAPID software. CONTRAST:  100 mL Omnipaque 350 COMPARISON:  Plain head CT 0659 hours today.  Brain MRI 11/14/2020.  CTA head and neck 11/13/2020. FINDINGS: CT Brain Perfusion Findings: ASPECTS: 10 (of all vein left MCA and ACA infarcts since August). CBF (<30%) Volume: 0 Perfusion (Tmax>6.0s) volume: 0 Mismatch Volume: Not applicable Infarction Location:Not applicable CTA NECK Skeleton: Stable.  No acute osseous abnormality identified. Upper chest: Stable upper lung septal thickening with mild to moderate centrilobular emphysema. No superior mediastinal lymphadenopathy identified. Other neck: Stable. Aortic arch: Calcified aortic atherosclerosis. Three vessel arch configuration better demonstrated on the August CTA. Right carotid system: Visible brachiocephalic artery and right CCA origin are patent with plaque but no significant stenosis. 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 As before, no right ICA reconstitution to the skull base. Left carotid system: Stable left CCA plaque without stenosis. Bulky calcified plaque at the left ICA origin and bulb is stable with stenosis numerically estimated at 70 % with respect to the distal vessel. The left ICA remains patent to the skull base without additional stenosis. Vertebral arteries: Stable proximal right subclavian and right vertebral artery plaque with moderate stenosis of the non dominant right vertebral artery origin. The right vertebral remains patent to the skull base, diminutive, but with no additional stenosis. Stable proximal left subclavian artery plaque without stenosis. Mild calcified plaque at the left vertebral artery origin without stenosis. Dominant left vertebral with tortuous V1 segment. The left vertebral remains patent to the skull base without stenosis. CTA HEAD Posterior circulation: Non dominant right vertebral artery continues to the vertebrobasilar junction without stenosis. Right AICA appears dominant and patent. Dominant left vertebral V4 segment calcified plaque with mild to moderate  stenosis appears stable (series 7, image 2 95). Stenosis is proximal to the normal left PICA origin and vertebrobasilar junction. Patent basilar artery with mild irregularity but no stenosis. Patent SCA and left PCA origins. Fetal type right PCA origin but with patent communication to the right SCA as before. Left posterior communicating is diminutive or absent. Bilateral PCA branches are stable and within normal limits. Anterior circulation: Right ICA terminus reconstituted from the posterior communicating artery as before. No right ICA siphon enhancement. Right MCA and ACA origins remain patent and within normal limits. Left ICA siphon calcified plaque with mild to moderate supraclinoid stenosis is stable. Patent left MCA and ACA origins. Normal anterior communicating artery. ACA A2 segments are patent without stenosis. Improved appearance of distal left ACA branches since August. No discrete branch occlusion now. Left MCA M1 segment has recanalized and is patent to the trifurcation without stenosis. Some left MCA branches are attenuated concordant with the recent infarction. No M2 branch occlusion is evident. Right MCA M1 segment and bifurcation are patent without stenosis. Right MCA branches appear stable and within normal limits. Venous sinuses: Early contrast timing, grossly patent. Anatomic variants: Dominant left vertebral artery. Fetal type right PCA origin. Review of the MIP images confirms the above findings IMPRESSION: 1. Negative for emergent large vessel occlusion and no infarct core or ischemic penumbra detected by CT Perfusion. 2. 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 3. Recanalized and or improved Left MCA and distal Left ACA branches since August. 4. Stable atherosclerosis notable for: - high-grade proximal Left ICA stenosis estimated at 70%, due to bulky calcified plaque. - up to moderate bilateral  vertebral artery stenosis (dominant left vertebral V4 segment and non dominant right vertebral artery origin). - mild to moderate supraclinoid Left ICA siphon stenosis due to calcified plaque. 5. Aortic Atherosclerosis (ICD10-I70.0). Emphysema (ICD10-J43.9). Salient findings discussed by telephone with Dr. Caryl Pina on 01/22/2021 at 07:36 . Electronically Signed   By: Odessa Fleming M.D.   On: 01/22/2021 07:41   (Echo, Carotid, EGD, Colonoscopy, ERCP)    Subjective:  Patient working with speech therapy has been by the bedside  Discharge Exam: Vitals:   01/23/21 0735 01/23/21 1139  BP: (!) 158/86 (!) 147/87  Pulse: 80 67  Resp:  19  Temp: 97.9 F (36.6 C) 97.6 F (36.4 C)  SpO2: 100% 100%   Vitals:   01/23/21 0431 01/23/21 0504 01/23/21 0735 01/23/21 1139  BP: (!) 181/105 (!) 175/109 [name])  158/86 (!) 147/87  Pulse: 77 70 80 67  Resp: 16   19  Temp: (!) 97.5 F (36.4 C)  97.9 F (36.6 C) 97.6 F (36.4 C)  TempSrc: Axillary  Oral Oral  SpO2: 100%  100% 100%    General: Pt is alert, awake, not in acute distress Cardiovascular: RRR, S1/S2 +, no rubs, no gallops Respiratory: CTA bilaterally, no wheezing, no rhonchi Abdominal: Soft, NT, ND, bowel sounds + Extremities: no edema, no cyanosis    The results of significant diagnostics from this hospitalization (including imaging, microbiology, ancillary and laboratory) are listed below for reference.     Microbiology: Recent Results (from the past 240 hour(s))  SARS CORONAVIRUS 2 (TAT 6-24 HRS)     Status: None   Collection Time: 01/14/21 10:22 PM  Result Value Ref Range Status   SARS Coronavirus 2 NEGATIVE NEGATIVE Final    Comment: (NOTE) SARS-CoV-2 target nucleic acids are NOT DETECTED.  The SARS-CoV-2 RNA is generally detectable in upper and lower respiratory specimens during the acute phase of infection. Negative results do not preclude SARS-CoV-2 infection, do not rule out co-infections with other pathogens, and should not  be used as the sole basis for treatment or other patient management decisions. Negative results must be combined with clinical observations, patient history, and epidemiological information. The expected result is Negative.  Fact Sheet for Patients: HairSlick.no  Fact Sheet for Healthcare Providers: quierodirigir.com  This test is not yet approved or cleared by the [place] FDA and  has been authorized for detection and/or diagnosis of SARS-CoV-2 by FDA under an Emergency Use Authorization (EUA). This EUA will remain  in effect (meaning this test can be used) for the duration of the COVID-19 declaration under Se ction 564(b)(1) of the Act, 21 U.S.C. section 360bbb-3(b)(1), unless the authorization is terminated or revoked sooner.  Performed at [facility], [address] [place], [place] [address]   Resp Panel by RT-PCR (Flu A&B, Covid) Nasopharyngeal Swab     Status: None   Collection Time: 01/22/21  6:52 AM   Specimen: Nasopharyngeal Swab; Nasopharyngeal(NP) swabs in vial transport medium  Result Value Ref Range Status   SARS Coronavirus 2 by RT PCR NEGATIVE NEGATIVE Final    Comment: (NOTE) SARS-CoV-2 target nucleic acids are NOT DETECTED.  The SARS-CoV-2 RNA is generally detectable in upper respiratory specimens during the acute phase of infection. The lowest concentration of SARS-CoV-2 viral copies this assay can detect is 138 copies/mL. A negative result does not preclude SARS-Cov-2 infection and should not be used as the sole basis for treatment or other patient management decisions. A negative result may occur with  improper specimen collection/handling, submission of specimen other than nasopharyngeal swab, presence of viral mutation(s) within the areas targeted by this assay, and inadequate number of viral copies(<138 copies/mL). A negative result must be combined with clinical observations, patient  history, and epidemiological information. The expected result is Negative.  Fact Sheet for Patients:  BloggerCourse.com  Fact Sheet for Healthcare Providers:  SeriousBroker.it  This test is no t yet approved or cleared by the [place] FDA and  has been authorized for detection and/or diagnosis of SARS-CoV-2 by FDA under an Emergency Use Authorization (EUA). This EUA will remain  in effect (meaning this test can be used) for the duration of the COVID-19 declaration under Section 564(b)(1) of the Act, 21 U.S.C.section 360bbb-3(b)(1), unless the authorization is terminated  or revoked sooner.       Influenza A  by PCR NEGATIVE NEGATIVE Final   Influenza B by PCR NEGATIVE NEGATIVE Final    Comment: (NOTE) The Xpert Xpress SARS-CoV-2/FLU/RSV plus assay is intended as an aid in the diagnosis of influenza from Nasopharyngeal swab specimens and should not be used as a sole basis for treatment. Nasal washings and aspirates are unacceptable for Xpert Xpress SARS-CoV-2/FLU/RSV testing.  Fact Sheet for Patients: BloggerCourse.com  Fact Sheet for Healthcare Providers: SeriousBroker.it  This test is not yet approved or cleared by the [place] FDA and has been authorized for detection and/or diagnosis of SARS-CoV-2 by FDA under an Emergency Use Authorization (EUA). This EUA will remain in effect (meaning this test can be used) for the duration of the COVID-19 declaration under Section 564(b)(1) of the Act, 21 U.S.C. section 360bbb-3(b)(1), unless the authorization is terminated or revoked.  Performed at [facility], [address] [place], [place] [address]      Labs: BNP (last 3 results) Recent Labs    11/21/20 0316 11/23/20 0412 12/12/20 1138  BNP 1,286.5* 898.1* 970.9*   Basic Metabolic Panel: Recent Labs  Lab 01/22/21 0651 01/22/21 0658 01/23/21 0259   NA 139 138 135  K 4.3 3.9 3.6  CL 98 98 94*  CO2 33*  --  28  GLUCOSE 104* 106* 85  BUN CREATININE 0.98 0.90 0.85  CALCIUM 9.0  --  8.7*  MG  --   --  2.0  PHOS  --   --  4.1   Liver Function Tests: Recent Labs  Lab 01/22/21 0651 01/23/21 0259  AST 39 28  ALT 38 34  ALKPHOS 68 57  BILITOT 1.3* 1.5*  PROT 6.1* 5.6*  ALBUMIN 3.4* 3.1*   No results for input(s): LIPASE, AMYLASE in the last 168 hours. No results for input(s): AMMONIA in the last 168 hours. CBC: Recent Labs  Lab 01/22/21 0651 01/22/21 0658 01/23/21 0259  WBC 10.7*  --  8.9  NEUTROABS 7.7  --   --   HGB 15.5* 16.7* 14.5  HCT 48.4* 49.0* 45.8  MCV 96.4  --  95.0  PLT 237  --  195   Cardiac Enzymes: No results for input(s): CKTOTAL, CKMB, CKMBINDEX, TROPONINI in the last 168 hours. BNP: Invalid input(s): POCBNP CBG: Recent Labs  Lab 01/22/21 0651 01/23/21 0748 01/23/21 1139  GLUCAP 110* 78 124*   D-Dimer No results for input(s): DDIMER in the last 72 hours. Hgb A1c No results for input(s): HGBA1C in the last 72 hours. Lipid Profile No results for input(s): CHOL, HDL, LDLCALC, TRIG, CHOLHDL, LDLDIRECT in the last 72 hours. Thyroid function studies No results for input(s): TSH, T4TOTAL, T3FREE, THYROIDAB in the last 72 hours.  Invalid input(s): FREET3 Anemia work up No results for input(s): VITAMINB12, FOLATE, FERRITIN, TIBC, IRON, RETICCTPCT in the last 72 hours. Urinalysis    Component Value Date/Time   COLORURINE AMBER (A) 12/12/2020 2043   APPEARANCEUR HAZY (A) 12/12/2020 2043   LABSPEC 1.020 12/12/2020 2043   PHURINE 6.0 12/12/2020 2043   GLUCOSEU NEGATIVE 12/12/2020 2043   HGBUR NEGATIVE 12/12/2020 2043   BILIRUBINUR NEGATIVE 12/12/2020 2043   KETONESUR NEGATIVE 12/12/2020 2043   PROTEINUR 100 (A) 12/12/2020 2043   UROBILINOGEN 1.0 02/27/2015 1127   NITRITE NEGATIVE 12/12/2020 2043   LEUKOCYTESUR SMALL (A) 12/12/2020 2043   Sepsis Labs Invalid input(s):  PROCALCITONIN,  WBC,  LACTICIDVEN Microbiology Recent Results (from the past 240 hour(s))  SARS CORONAVIRUS 2 (TAT 6-24 HRS)  Status: None   Collection Time: 01/14/21 10:22 PM  Result Value Ref Range Status   SARS Coronavirus 2 NEGATIVE NEGATIVE Final    Comment: (NOTE) SARS-CoV-2 target nucleic acids are NOT DETECTED.  The SARS-CoV-2 RNA is generally detectable in upper and lower respiratory specimens during the acute phase of infection. Negative results do not preclude SARS-CoV-2 infection, do not rule out co-infections with other pathogens, and should not be used as the sole basis for treatment or other patient management decisions. Negative results must be combined with clinical observations, patient history, and epidemiological information. The expected result is Negative.  Fact Sheet for Patients: HairSlick.no  Fact Sheet for Healthcare Providers: quierodirigir.com  This test is not yet approved or cleared by the [place] FDA and  has been authorized for detection and/or diagnosis of SARS-CoV-2 by FDA under an Emergency Use Authorization (EUA). This EUA will remain  in effect (meaning this test can be used) for the duration of the COVID-19 declaration under Se ction 564(b)(1) of the Act, 21 U.S.C. section 360bbb-3(b)(1), unless the authorization is terminated or revoked sooner.  Performed at [facility], [address], [place], [place] [address]   Resp Panel by RT-PCR (Flu A&B, Covid) Nasopharyngeal Swab     Status: None   Collection Time: 01/22/21  6:52 AM   Specimen: Nasopharyngeal Swab; Nasopharyngeal(NP) swabs in vial transport medium  Result Value Ref Range Status   SARS Coronavirus 2 by RT PCR NEGATIVE NEGATIVE Final    Comment: (NOTE) SARS-CoV-2 target nucleic acids are NOT DETECTED.  The SARS-CoV-2 RNA is generally detectable in upper respiratory specimens during the acute phase of  infection. The lowest concentration of SARS-CoV-2 viral copies this assay can detect is 138 copies/mL. A negative result does not preclude SARS-Cov-2 infection and should not be used as the sole basis for treatment or other patient management decisions. A negative result may occur with  improper specimen collection/handling, submission of specimen other than nasopharyngeal swab, presence of viral mutation(s) within the areas targeted by this assay, and inadequate number of viral copies(<138 copies/mL). A negative result must be combined with clinical observations, patient history, and epidemiological information. The expected result is Negative.  Fact Sheet for Patients:  BloggerCourse.com  Fact Sheet for Healthcare Providers:  SeriousBroker.it  This test is no t yet approved or cleared by the [place] FDA and  has been authorized for detection and/or diagnosis of SARS-CoV-2 by FDA under an Emergency Use Authorization (EUA). This EUA will remain  in effect (meaning this test can be used) for the duration of the COVID-19 declaration under Section 564(b)(1) of the Act, 21 U.S.C.section 360bbb-3(b)(1), unless the authorization is terminated  or revoked sooner.       Influenza A by PCR NEGATIVE NEGATIVE Final   Influenza B by PCR NEGATIVE NEGATIVE Final    Comment: (NOTE) The Xpert Xpress SARS-CoV-2/FLU/RSV plus assay is intended as an aid in the diagnosis of influenza from Nasopharyngeal swab specimens and should not be used as a sole basis for treatment. Nasal washings and aspirates are unacceptable for Xpert Xpress SARS-CoV-2/FLU/RSV testing.  Fact Sheet for Patients: BloggerCourse.com  Fact Sheet for Healthcare Providers: SeriousBroker.it  This test is not yet approved or cleared by the [place] FDA and has been authorized for detection and/or diagnosis of SARS-CoV-2  by FDA under an Emergency Use Authorization (EUA). This EUA will remain in effect (meaning this test can be used) for the duration of the COVID-19 declaration under Section  564(b)(1) of the Act, 21 U.S.C. section 360bbb-3(b)(1), unless the authorization is terminated or revoked.  Performed at [facility], [address], [place], [place] [address]      Time coordinating discharge:  SIGNED:   Alwyn Ren, MD  [facility] 01/23/2021, 2:21 PM

## 2021-01-23 NOTE — Progress Notes (Signed)
STROKE TEAM PROGRESS NOTE   INTERVAL HISTORY Husband at bedside.  Patient is working with the therapists and sitting on the side of the bed. She continues to have significant facial droop and aphasia with baseline right sided weakness and communication deficit from recent stroke in August.  Which appear unchanged.  Vital signs are stable.     Vitals:   01/23/21 0431 01/23/21 0504 01/23/21 0735 01/23/21 1139  BP: (!) 181/105 (!) 175/109 (!) 158/86 (!) 147/87  Pulse: 77 70 80 67  Resp: 16   19  Temp: (!) 97.5 F (36.4 C)  97.9 F (36.6 C) 97.6 F (36.4 C)  TempSrc: Axillary  Oral Oral  SpO2: 100%  100% 100%   CBC:  Recent Labs  Lab 01/22/21 0651 01/22/21 0658 01/23/21 0259  WBC 10.7*  --  8.9  NEUTROABS 7.7  --   --   HGB 15.5* 16.7* 14.5  HCT 48.4* 49.0* 45.8  MCV 96.4  --  95.0  PLT 237  --  195   Basic Metabolic Panel:  Recent Labs  Lab 01/22/21 0651 01/22/21 0658 01/23/21 0259  NA 139 138 135  K 4.3 3.9 3.6  CL 98 98 94*  CO2 33*  --  28  GLUCOSE 104* 106* 85  BUN 16 19 14   CREATININE 0.98 0.90 0.85  CALCIUM 9.0  --  8.7*  MG  --   --  2.0  PHOS  --   --  4.1   Lipid Panel: No results for input(s): CHOL, TRIG, HDL, CHOLHDL, VLDL, LDLCALC in the last 168 hours. HgbA1c: No results for input(s): HGBA1C in the last 168 hours. Urine Drug Screen: No results for input(s): LABOPIA, COCAINSCRNUR, LABBENZ, AMPHETMU, THCU, LABBARB in the last 168 hours.  Alcohol Level No results for input(s): ETH in the last 168 hours.  IMAGING past 24 hours ECHOCARDIOGRAM COMPLETE  Result Date: 01/23/2021    ECHOCARDIOGRAM REPORT   Patient Name:   Beverly Li Date of Exam: 01/23/2021 Medical Rec #:  03/25/2021       Height:       63.0 in Accession #:    [id number]      Weight:       99.1 lb Date of Birth:  Feb 20, 1946       BSA:          1.435 m Patient Age:    75 years        BP:           158/86 mmHg Patient Gender: F               HR:           80 bpm. Exam Location:  Inpatient  Procedure: 2D Echo, Cardiac Doppler and Color Doppler Indications:    Abnormal ECG R94.31  History:        Patient has prior history of Echocardiogram examinations, most                 recent 11/14/2020. CHF, PAD, Arrythmias:Atrial Fibrillation; Risk                 Factors:Sleep Apnea and Hypertension. Hypertrophic                 cardiomyopathy.  Sonographer:    01/14/2021 RDCS Referring Phys: [phone] LINDSAY B ROBERTS IMPRESSIONS  1. Left ventricular ejection fraction, by estimation, is 60%. The left ventricle has normal function. The left ventricle has no regional wall  motion abnormalities. There is severe left ventricular hypertrophy. Peak gradient of 2 mm Hg; there is no evidence of LVOT Obstruction in this study. No evidence of apical aneursysm. Left ventricular diastolic parameters are indeterminate.  2. Right ventricular systolic function is mildly reduced. The right ventricular size is normal. There is normal pulmonary artery systolic pressure.  3. Left atrial size was mildly dilated.  4. Right atrial size was moderately dilated.  5. The mitral valve is degenerative. Mild mitral valve regurgitation. The mean mitral valve gradient is 4.0 mmHg with average heart rate of 72 bpm.  6. The aortic valve is tricuspid. There is moderate calcification of the aortic valve. Aortic valve regurgitation is mild. Mild to moderate aortic valve sclerosis/calcification is present, without any evidence of aortic stenosis.  7. Aortic dilatation noted. There is mild dilatation of the ascending aorta, measuring 40 mm. Comparison(s): A prior study was performed on 11/14/2020. No significant change from prior study. FINDINGS  Left Ventricle: Left ventricular ejection fraction, by estimation, is 60%. The left ventricle has normal function. The left ventricle has no regional wall motion abnormalities. The left ventricular internal cavity size was small. There is severe left ventricular hypertrophy. Left ventricular diastolic  parameters are indeterminate. Right Ventricle: The right ventricular size is normal. Right vetricular wall thickness was not well visualized. Right ventricular systolic function is mildly reduced. There is normal pulmonary artery systolic pressure. The tricuspid regurgitant velocity is 2.62 m/s, and with an assumed right atrial pressure of 8 mmHg, the estimated right ventricular systolic pressure is 35.5 mmHg. Left Atrium: Left atrial size was mildly dilated. Right Atrium: Right atrial size was moderately dilated. Pericardium: There is no evidence of pericardial effusion. Mitral Valve: The mitral valve is degenerative in appearance. Mild mitral valve regurgitation. MV peak gradient, 7.9 mmHg. The mean mitral valve gradient is 4.0 mmHg with average heart rate of 72 bpm. Tricuspid Valve: The tricuspid valve is grossly normal. Tricuspid valve regurgitation is mild . No evidence of tricuspid stenosis. Aortic Valve: The aortic valve is tricuspid. There is moderate calcification of the aortic valve. Aortic valve regurgitation is mild. Aortic regurgitation PHT measures 1128 msec. Mild to moderate aortic valve sclerosis/calcification is present, without any evidence of aortic stenosis. Pulmonic Valve: The pulmonic valve was not well visualized. Pulmonic valve regurgitation is mild to moderate. No evidence of pulmonic stenosis. Aorta: Aortic dilatation noted. There is mild dilatation of the ascending aorta, measuring 40 mm. IAS/Shunts: The atrial septum is grossly normal.  LEFT VENTRICLE PLAX 2D LVIDd:         3.30 cm     Diastology LVIDs:         2.30 cm     LV e' medial:    3.53 cm/s LV PW:         1.30 cm     LV E/e' medial:  27.3 LV IVS:        2.00 cm     LV e' lateral:   5.89 cm/s LVOT diam:     2.00 cm     LV E/e' lateral: 16.4 LV SV:         35 LV SV Index:   25 LVOT Area:     3.14 cm  LV Volumes (MOD) LV vol d, MOD A2C: 69.1 ml LV vol d, MOD A4C: 81.8 ml LV vol s, MOD A2C: 28.1 ml LV vol s, MOD A4C: 35.3 ml LV SV  MOD A2C:     41.0 ml LV SV MOD  A4C:     81.8 ml LV SV MOD BP:      43.1 ml RIGHT VENTRICLE RV S prime:     8.93 cm/s TAPSE (M-mode): 1.1 cm LEFT ATRIUM             Index        RIGHT ATRIUM           Index LA diam:        4.20 cm 2.93 cm/m   RA Area:     22.10 cm LA Vol (A2C):   43.5 ml 30.32 ml/m  RA Volume:   63.60 ml  44.33 ml/m LA Vol (A4C):   56.0 ml 39.03 ml/m LA Biplane Vol: 53.5 ml 37.29 ml/m  AORTIC VALVE LVOT Vmax:   76.00 cm/s LVOT Vmean:  52.500 cm/s LVOT VTI:    0.112 m AI PHT:      1128 msec  AORTA Ao Root diam: 3.40 cm Ao Asc diam:  3.95 cm MITRAL VALVE               TRICUSPID VALVE MV Area (PHT): 4.65 cm    TR Peak grad:   27.5 mmHg MV Area VTI:   1.52 cm    TR Vmax:        262.00 cm/s MV Peak grad:  7.9 mmHg MV Mean grad:  4.0 mmHg    SHUNTS MV Vmax:       1.40 m/s    Systemic VTI:  0.11 m MV Vmean:      88.9 cm/s   Systemic Diam: 2.00 cm MV Decel Time: 163 msec MV E velocity: 96.43 cm/s Riley Lam MD Electronically signed by Riley Lam MD Signature Date/Time: 01/23/2021/10:36:35 AM    Final     PHYSICAL EXAM Frail elderly Caucasian lady not in distress. . Afebrile. Head is nontraumatic. Neck is supple without bruit.    Cardiac exam no murmur or gallop. Lungs are clear to auscultation. Distal pulses are well felt.  Neurological Exam :  Patient is awake and alert.  She has receptive and expressive aphasia with dysarthria she can be understood with some difficulty.  She cannot look only speak a few words and short sentences.  She can follow simple midline and one-step commands only.  She is not able to name and repeat.  Extraocular movements are full range without nystagmus.  She blinks to threat more on the left than on the right.  She has moderate right lower facial weakness.  Tongue midline.  Motor system exam shows dense right hemiplegia with only minimal response to painful stimulus.  Antigravity strength on the left side.  Sensation appears preserved bilaterally.   Deep tendon flexes are symmetric plantars are downgoing.  Gait not tested  ASSESSMENT/PLAN Beverly Li is a 75 y.o. female with history of  stroke with right sided weakness, now in a SNF with residual right sided weakness and communication deficit, with extensive PMHx including anxiety, arthritis, atrial fibrillation (on Eliquis), Bell's palsy (left), nephrolithiasis, HTN, hypertrophic cardiomyopathy, mild aortic sclerosis, PVD and osteopenia who presents via EMS as a Code Stroke for new onset of left facial droop with worsened dysphasia. LKN was 0300. When staff at her SNF checked on her at 0530, the patient was noted to have left facial droop and slurred speech. EMS was called and on their arrival, the patient could follow only some commands, had left facial droop and was nonverbal except for being able to say her name and nod  as well as say yes to questions. On arrival to the ED she was hypertensive, the same was noted, in addition to residual right sided weakness, but patient's "yes" statements were hypophonic, dysarthric and often made in response to questions which should have had a "no" response. She did give indications to EMS that she was having a headache, but was not clutching at her head. No vomiting. Does not appear to be nauseated. No diaphoresis. No pallor or cyanosis.    Stroke:  Likely recrudescence of previous Left MCA and ACA stroke now with new/increased diffusion restriction in the left basal ganglia, internal and external capsules, extending to the caudate body/corona radiata    Code Stroke CT head 1. Expected evolution of the left MCA and ACA territory infarcts since last month. Underlying chronic small vessel disease. 2. No new cortically based infarct or acute intracranial hemorrhage identified. ASPECTS 10. CTA head & neck  1. Negative for emergent large vessel occlusion and no infarct core or ischemic penumbra detected by CT Perfusion. 2. 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 3. Recanalized and or improved Left MCA and distal Left ACA branches since August. 4. Stable atherosclerosis notable for: - high-grade proximal Left ICA stenosis estimated at 70%, due to bulky calcified plaque. - up to moderate bilateral vertebral artery stenosis (dominant left vertebral V4 segment and non dominant right vertebral arteryorigin). - mild to moderate supraclinoid Left ICA siphon stenosis due to calcified plaque. 5. Aortic Atherosclerosis (ICD10-I70.0). Emphysema (ICD10-J43.9).  MRI   1. New/increased diffusion restriction in the left basal ganglia, internal and external capsules, extending to the caudate body/corona radiata compared to the study from 11/14/2020 consistent with acute to subacute infarct. 2. Additional diffusion restriction in the left MCA and ACA territories consistent with evolving subacute infarct as seen on the study from 11/14/2020. 3. Curvilinear SWI signal dropout along the left precentral sulcus consistent with petechial hemorrhage. There is an additional focus of petechial hemorrhage versus punctate chronic microhemorrhage in the left superior frontal gyru 2D Echo Aug 2022: LVH, Grade II  diastolic dysfunction,  Left atrial size was severely dilated.   Right atrial size was mildly dilated LDL 120 HgbA1c 6.0 VTE prophylaxis - on eliquis    Diet   DIET DYS 2 Room service appropriate? Yes; Fluid consistency: Nectar Thick   On Eliquis prior to admission Recommend to continue Eliquis Therapy recommendations:  Pending Disposition:  likely to return SNF  History of  LMCA Stroke  Aug 2022, s/p IR with TICI 3 in the setting of PAF taken off eliquis x 1 wk due to abdominal hematoma   CTH w/L MCA stroke.  MRI Brain revealed left MCA and ACA stroke.  CTA Head and Neck showed occlusion of the left MCA and an age indeterminate occlusion of the  proximal right internal carotid artery in the neck with non-opacification through the carotid terminus.  Echo w/EF 60 to 65 %, left atrium severely dilated.  LDL 120  hemoglobin a1c 6.0.   Hypertensive Emergency Hypertensive 206/125 on arrival  Stable  Permissive hypertension (OK if < 220/120) but gradually normalize in 5-7 days Long-term BP goal normotensive  Hyperlipidemia Home meds:  zetia  LDL 120, goal < 70 High intensity statin: has multiple statin allergies, taking home zetia  Continue zetia at discharge         Dysphagia Swallow eval by SLP  is PENDING Had made progress at [facility]  prior to admission to taking oral diet/meds  Other stroke risk factors Advanced age Former smoker PAD Recent left MCA stroke Aug 2022:  Other Active Problems  Hospital day # 0     Patient presented in August with left M1 occlusion and global aphasia and right hemiparesis she was treated with successful mechanical thrombectomy but had significant residual deficits.  She was recovering in skilled nursing facility and comes in with worsening of her existing deficits and MRI shows additional left hemispheric infarcts adjacent to the previous ones.  CT angiogram shows left MCA still to be patent.  Her neurological exam demonstrates profound aphasia with dense right hemiplegia.  Patient has A. fib and is presumably getting Eliquis.  I do not see solid evidence to justify switching Eliquis to alternative anticoagulants or adding aspirin as improvement to be superior to discontinuing it.  I had a long discussion with the patient's husband at the bedside and answered questions.  I do not think repeating echocardiogram and further stroke work-up is necessary as she had a complete evaluation 2 months ago.  Speech therapy to check swallow eval.  Continue ongoing therapy managed medically stable for transfer back to skilled nursing facility next few days.  Discussed with Dr.matthews and patient's husband.  Greater  than 50% time during this 25-minute visit was spent on counseling and coordination of care and answering questions about her recurrent stroke and confusion.  Stroke team will sign off.  Kindly call for questions     Delia Heady, MD Medical Director [facility] Pager: [phone] 01/23/2021 1:11 PM   To contact Stroke Continuity provider, please refer to [email or web address]. After hours, contact General Neurology

## 2021-01-23 NOTE — Care Management Obs Status (Signed)
MEDICARE OBSERVATION STATUS NOTIFICATION   Patient Details  Name: Beverly Li MRN: [id number] Date of Birth: October 22, 1945   Medicare Observation Status Notification Given:  Yes    Baldemar Lenis, LCSW 01/23/2021, 3:04 PM

## 2021-01-23 NOTE — Evaluation (Signed)
Occupational Therapy Evaluation Patient Details Name: Beverly Li MRN: [id number] DOB: [birth year] Today's Date: 01/23/2021   History of Present Illness Pt is a 75 y/o female who presents to [facility] with worsening aphasia, dysarthria and right hemiparesis; MRI showed new decreased diffusion of left basal ganglia. Pt recently admitted on 11/13/20-12/06/20 for multiple infarcts requiring revascularization and intubation, readmitted 12/12/20 for respritory distress, d/c'd to CIR on 9/9, and d/c'd to SNF on 10/3. PEG tube placed 11/2020. 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   Clinical Impression   Beverly Li required assist for all ADLs and mobility prior to the above admission, husband present and supportive throughout. Evaluation completed in conjunction with PT for safety. Pt completed bed mobility and sit<>stand with mod A +2, and ambulated about 88ft with max A +2. Pts R extremities are flaccid, with a sublux R shoulder. She is aphasic, answered most yes/no questions appropriately with increased time and said a few short sentenced this session. Pt would benefit from OT acutely; however has current d/c plans to d/c back to SNF today. Recommend continue of follow up care with therapies at SNF.      Recommendations for follow up therapy are one component of a multi-disciplinary discharge planning process, led by the attending physician.  Recommendations may be updated based on patient status, additional functional criteria and insurance authorization.   Follow Up Recommendations  SNF;Supervision/Assistance - 24 hour    Equipment Recommendations  None recommended by OT       Precautions / Restrictions Precautions Precautions: Fall Precaution Comments: R hemiplegia with R shoulder subluxation, PEG Restrictions Weight Bearing Restrictions: No      Mobility Bed Mobility Overal bed mobility: Needs Assistance Bed Mobility: Rolling;Sidelying to  Sit Rolling: Mod assist;+2 for physical assistance Sidelying to sit: Mod assist;+2 for physical assistance       General bed mobility comments: Bed pad utilized to fully roll. +2 assist for trunk elevation up into sitting position.    Transfers Overall transfer level: Needs assistance Equipment used: 2 person hand held assist Transfers: Sit to/from Stand Sit to Stand: Mod assist;+2 physical assistance;+2 safety/equipment         General transfer comment: Mod A +2 for sit<>stand, max A +2 for gait    Balance Overall balance assessment: Needs assistance Sitting-balance support: Feet supported Sitting balance-Leahy Scale: Poor Sitting balance - Comments: UE support and min-modA to sit statically EOB.   Standing balance support: Bilateral upper extremity supported Standing balance-Leahy Scale: Poor Standing balance comment: reliant on support of therapist x2                           ADL either performed or assessed with clinical judgement   ADL Overall ADL's : Needs assistance/impaired Eating/Feeding: NPO   Grooming: Minimal assistance;Sitting   Upper Body Bathing: Moderate assistance;Sitting   Lower Body Bathing: Maximal assistance;+2 for physical assistance;Sit to/from stand   Upper Body Dressing : Moderate assistance;Sitting   Lower Body Dressing: Maximal assistance;+2 for physical assistance;Sit to/from stand   Toilet Transfer: Maximal assistance;+2 for physical assistance;+2 for safety/equipment;Ambulation   Toileting- Clothing Manipulation and Hygiene: Moderate assistance;Sitting/lateral lean       Functional mobility during ADLs: Maximal assistance;+2 for physical assistance;+2 for safety/equipment       Vision Baseline Vision/History: 1 Wears glasses Patient Visual Report: No change from baseline       Perception  Praxis      Pertinent Vitals/Pain Pain Assessment: Faces Faces Pain Scale: No hurt Pain Intervention(s): Monitored  during session     Hand Dominance Right   Extremity/Trunk Assessment Upper Extremity Assessment Upper Extremity Assessment: Defer to OT evaluation RUE Deficits / Details: flaccid, trace muscle activation noted in R thumb RUE Sensation: decreased light touch RUE Coordination: decreased fine motor;decreased gross motor   Lower Extremity Assessment Lower Extremity Assessment: RLE deficits/detail RLE Deficits / Details: 0/5 DF, quads and hip flexors; 1/5 hamstrings RLE Sensation: decreased light touch;decreased proprioception RLE Coordination: decreased fine motor;decreased gross motor   Cervical / Trunk Assessment Cervical / Trunk Assessment: Other exceptions Cervical / Trunk Exceptions: forward head posture with rounded shoulders   Communication Communication Communication: Expressive difficulties   Cognition Arousal/Alertness: Awake/alert Behavior During Therapy: Flat affect Overall Cognitive Status: Difficult to assess                                 General Comments: pt followed all commands with incrased time, answered most yes/no questions appropriately; communicated 3 short sentences - husband states pt's communication fluctuates in accuracy   General Comments  VSS on RA, husband present and supportive throughout session    Exercises     Shoulder Instructions      Home Living Family/patient expects to be discharged to:: Skilled nursing facility                                 Additional Comments: pt d/c'd to [facility] from CIR      Prior Functioning/Environment Level of Independence: Needs assistance  Gait / Transfers Assistance Needed: At [facility], ambulating ~50 feet with the hemiwalker per husband ADL's / Homemaking Assistance Needed: Requires assist   Comments: per CIR notes pt was min-max A for all ADLs and mobility, unsure of level of function at [employer]. Peg and eating dys 2 diet at [facility], expressive aphasia         OT Problem List: Decreased strength;Decreased range of motion;Decreased activity tolerance;Impaired balance (sitting and/or standing);Decreased safety awareness;Decreased knowledge of use of DME or AE;Decreased knowledge of precautions;Pain;Impaired UE functional use;Impaired sensation      OT Treatment/Interventions: Self-care/ADL training;Therapeutic exercise;DME and/or AE instruction;Therapeutic activities;Patient/family education;Balance training    OT Goals(Current goals can be found in the care plan section) Acute Rehab OT Goals Patient Stated Goal: per husband, back to [facility] to continue rehab OT Goal Formulation: With patient Time For Goal Achievement: 02/06/21  OT Frequency: Min 2X/week   Barriers to D/C:            Co-evaluation              AM-PAC OT "6 Clicks" Daily Activity     Outcome Measure Help from another person eating meals?: Total Help from another person taking care of personal grooming?: A Little Help from another person toileting, which includes using toliet, bedpan, or urinal?: A Lot Help from another person bathing (including washing, rinsing, drying)?: A Lot Help from another person to put on and taking off regular upper body clothing?: A Little Help from another person to put on and taking off regular lower body clothing?: A Lot 6 Click Score: 13   End of Session Equipment Utilized During Treatment: Gait belt;Oxygen Nurse Communication: Mobility status (SpO2 sat)  Activity Tolerance: Patient tolerated treatment well Patient left: in chair;with call  bell/phone within reach;with chair alarm set;with family/visitor present  OT Visit Diagnosis: Other abnormalities of gait and mobility (R26.89);Repeated falls (R29.6);Muscle weakness (generalized) (M62.81);Hemiplegia and hemiparesis;Cognitive communication deficit (R41.841) Symptoms and signs involving cognitive functions: Cerebral infarction Hemiplegia - Right/Left: Right Hemiplegia -  dominant/non-dominant: Dominant Hemiplegia - caused by: Cerebral infarction                Time: 7703-4035 OT Time Calculation (min): 31 min Charges:  OT General Charges $OT Visit: 1 Visit OT Evaluation $OT Eval Moderate Complexity: 1 Mod   Brindley Madarang A Threasa Kinch 01/23/2021, 3:28 PM

## 2021-01-23 NOTE — Evaluation (Signed)
Physical Therapy Evaluation Patient Details Name: Beverly Li MRN: [id number] DOB: 01/28/1946 Today's Date: 01/23/2021  History of Present Illness  Pt is a 75 y/o female who presents to [facility] with worsening aphasia, dysarthria and right hemiparesis; MRI showed new decreased diffusion of left basal ganglia. Pt recently admitted on 11/13/20-12/06/20 for multiple infarcts requiring revascularization and intubation, readmitted 12/12/20 for respritory distress, d/c'd to CIR on 9/9, and d/c'd to SNF on 10/3. PEG tube placed 11/2020. 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   Clinical Impression  Pt admitted with above diagnosis. Pt currently with functional limitations due to the deficits listed below (see PT Problem List). At the time of PT eval pt was able to perform transfers and ambulation with up to +2 max assist for balance support and safety. Husband reports increased aphasia and word finding difficulties. Noted O2 sats dropped to 86% on RA after transfer to the chair. Pt was placed on 3L/min supplemental O2 at end of session and RN notified. Pt will benefit from skilled PT to increase their independence and safety with mobility to allow discharge to the venue listed below.          Recommendations for follow up therapy are one component of a multi-disciplinary discharge planning process, led by the attending physician.  Recommendations may be updated based on patient status, additional functional criteria and insurance authorization.  Follow Up Recommendations SNF    Equipment Recommendations  None recommended by PT    Recommendations for Other Services       Precautions / Restrictions Precautions Precautions: Fall Precaution Comments: R hemiplegia with R shoulder subluxation, PEG Restrictions Weight Bearing Restrictions: No      Mobility  Bed Mobility Overal bed mobility: Needs Assistance Bed Mobility: Rolling;Sidelying to Sit Rolling:  Mod assist;+2 for physical assistance Sidelying to sit: Mod assist;+2 for physical assistance       General bed mobility comments: Bed pad utilized to fully roll. +2 assist for trunk elevation up into sitting position.    Transfers Overall transfer level: Needs assistance Equipment used: 2 person hand held assist Transfers: Sit to/from Stand Sit to Stand: +2 physical assistance;Max assist         General transfer comment: +2 required for power-up to full standing position. Pt holding to therapist's arm on the L.  Ambulation/Gait Ambulation/Gait assistance: +2 physical assistance;Max assist Gait Distance (Feet): 3 Feet Assistive device: 2 person hand held assist Gait Pattern/deviations: Step-to pattern;Decreased stride length;Antalgic Gait velocity: Decreased Gait velocity interpretation: <1.31 ft/sec, indicative of household ambulator General Gait Details: Therapist facilitated weight shift and swing to on the RLE. +2 max throughout to take a few steps from bed to chair.  Stairs            Wheelchair Mobility    Modified Rankin (Stroke Patients Only) Modified Rankin (Stroke Patients Only) Pre-Morbid Rankin Score: Moderately severe disability Modified Rankin: Moderately severe disability     Balance Overall balance assessment: Needs assistance Sitting-balance support: Feet supported;Single extremity supported Sitting balance-Leahy Scale: Poor Sitting balance - Comments: UE support and min-modA to sit statically EOB.   Standing balance support: Bilateral upper extremity supported Standing balance-Leahy Scale: Zero Standing balance comment: +2 required                             Pertinent Vitals/Pain Pain Assessment: Faces Faces Pain Scale: No hurt Pain Intervention(s): Monitored  during session    Home Living Family/patient expects to be discharged to:: Skilled nursing facility                 Additional Comments: pt d/c'd to [facility]  from CIR    Prior Function Level of Independence: Needs assistance   Gait / Transfers Assistance Needed: At [facility], ambulating ~50 feet with the hemiwalker per husband  ADL's / Homemaking Assistance Needed: Requires assist  Comments: per CIR notes pt was min-max A for all ADLs and mobility, unsure of level of function at [employer]. Peg and eating dys 2 diet at [employer], expressive aphasia     Hand Dominance   Dominant Hand: Right    Extremity/Trunk Assessment   Upper Extremity Assessment Upper Extremity Assessment: Defer to OT evaluation RUE Deficits / Details: flaccid, trace muscle activation noted in R thumb RUE Sensation: decreased light touch RUE Coordination: decreased fine motor;decreased gross motor    Lower Extremity Assessment Lower Extremity Assessment: RLE deficits/detail RLE Deficits / Details: 0/5 DF, quads and hip flexors; 1/5 hamstrings RLE Sensation: decreased light touch;decreased proprioception RLE Coordination: decreased fine motor;decreased gross motor    Cervical / Trunk Assessment Cervical / Trunk Assessment: Other exceptions Cervical / Trunk Exceptions: forward head posture with rounded shoulders  Communication   Communication: Expressive difficulties  Cognition Arousal/Alertness: Awake/alert Behavior During Therapy: Flat affect Overall Cognitive Status: Difficult to assess                                 General Comments: pt followed all commands with incrased time, answered most yes/no questions appropriately; communicated 3 short sentences - husband states pt's communication fluctuates in accuracy      General Comments General comments (skin integrity, edema, etc.): VSS on RA, husband present and supportive throughout session    Exercises     Assessment/Plan    PT Assessment Patient needs continued PT services  PT Problem List Decreased strength;Decreased range of motion;Decreased activity tolerance;Decreased  balance;Decreased mobility;Decreased coordination;Decreased cognition;Decreased knowledge of use of DME;Decreased safety awareness;Decreased knowledge of precautions;Impaired sensation;Impaired tone       PT Treatment Interventions DME instruction;Gait training;Functional mobility training;Therapeutic exercise;Therapeutic activities;Neuromuscular re-education;Balance training;Patient/family education    PT Goals (Current goals can be found in the Care Plan section)  Acute Rehab PT Goals Patient Stated Goal: Return to [facility] today per husband PT Goal Formulation: With patient Time For Goal Achievement: 02/06/21 Potential to Achieve Goals: Fair    Frequency Min 4X/week   Barriers to discharge        Co-evaluation               AM-PAC PT "6 Clicks" Mobility  Outcome Measure Help needed turning from your back to your side while in a flat bed without using bedrails?: A Lot Help needed moving from lying on your back to sitting on the side of a flat bed without using bedrails?: Total Help needed moving to and from a bed to a chair (including a wheelchair)?: Total Help needed standing up from a chair using your arms (e.g., wheelchair or bedside chair)?: Total Help needed to walk in hospital room?: Total Help needed climbing 3-5 steps with a railing? : Total 6 Click Score: 7    End of Session Equipment Utilized During Treatment: Gait belt Activity Tolerance: Patient tolerated treatment well Patient left: in chair;with call bell/phone within reach;with chair alarm set;with family/visitor present Nurse Communication: Mobility status PT  Visit Diagnosis: Unsteadiness on feet (R26.81);Muscle weakness (generalized) (M62.81);Other symptoms and signs involving the nervous system (R29.898);Hemiplegia and hemiparesis Hemiplegia - Right/Left: Right Hemiplegia - dominant/non-dominant: Dominant Hemiplegia - caused by: Cerebral infarction    Time: 5681-2751 PT Time Calculation (min)  (ACUTE ONLY): 31 min   Charges:   PT Evaluation $PT Eval Moderate Complexity: 1 Mod          Conni Slipper, PT, DPT Acute Rehabilitation Services Pager: [phone] Office: [phone]   Marylynn Pearson 01/23/2021, 3:35 PM

## 2021-01-23 NOTE — Progress Notes (Signed)
Initial Nutrition Assessment  DOCUMENTATION CODES:  Severe malnutrition in context of chronic illness, Underweight  INTERVENTION:  Please obtain new weight  Provide Boost Breeze po TID (thickened to nectar thick consistency), each supplement provides 250 kcal and 9 grams of protein, each supplement provides 350 kcal and 20 grams of protein.   Allow pt to eat at meals, if meal completion is less than 50% then provide a bolus tube feed via PEG of Jevity 1.2 cal at volume of 356 ml (1.5 cartons/ARCs) up to TID   Additionally provide a bolus tube feed at HS.    Each bolus feed provides 427 kcal and 20 grams of protein.    Free water flushes of 250 ml Q4H per tube.   30ml Prosource TF BID per tube (provides 40 kcals and 11 grams protein per 44ml)  NUTRITION DIAGNOSIS:  Severe Malnutrition related to chronic illness (CHF) as evidenced by severe muscle depletion, severe fat depletion.  GOAL:  Patient will meet greater than or equal to 90% of their needs  MONITOR:  Labs, Weight trends, TF tolerance, I & O's, Diet advancement, PO intake, Supplement acceptance  REASON FOR ASSESSMENT:  Consult Enteral/tube feeding initiation and management  ASSESSMENT:  75 yo female admitted to [facility] w/ worsening aphasia, dysarthria and  R hemiparesis. Pt with PMH + for recent L CVA w/ residual dysphagia s/p PEG tube placement 11/2020. PMH also includes CHF, Afib, remote h/o Bell's palsy, HTN, OSA, PAD s/p R femoropopliteal bypass.    SLP performed BSE today and recommending continuation of Dysphagia 2 diet with Nectar Thick liquids as determined by MBSS during previous admission.  Pt dislikes many options available at hospital in terms of food and supplements. Pt reports that she does not like Ensure, but is willing to have Boost Breeze and Prosource as she did during last admission. Given pt has yet to have PO intake for this admission, will begin with Prosource TF via PEG but may transition to Prosource  Plus PO once pt is consistently accepting POs. Pt endorses hunger so anticipate will be able to transition soon. Discussed nutrition plan of care with RN who reports having no questions at this time.   Weight has not been updated for this admission. Please obtain new weight.   Medications: free water Q4H, protonix Labs reviewed. CBGs: 78-124 x 24 hours  UOP: 2x unmeasured occurrences x24 hours I/O: +766ml since admit  NUTRITION - FOCUSED PHYSICAL EXAM: Flowsheet Row Most Recent Value  Orbital Region Moderate depletion  Upper Arm Region Severe depletion  Thoracic and Lumbar Region Severe depletion  Buccal Region Moderate depletion  Temple Region Moderate depletion  Clavicle Bone Region Severe depletion  Clavicle and Acromion Bone Region Severe depletion  Scapular Bone Region Severe depletion  Dorsal Hand Severe depletion  Patellar Region Moderate depletion  Anterior Thigh Region Moderate depletion  Posterior Calf Region Moderate depletion  Edema (RD Assessment) None  Hair Reviewed  Eyes Reviewed  Mouth Reviewed  Skin Reviewed  Nails Reviewed      Diet Order:   Diet Order             DIET DYS 2 Room service appropriate? Yes; Fluid consistency: Nectar Thick  Diet effective now                  EDUCATION NEEDS:  No education needs have been identified at this time  Skin:  Skin Assessment: Reviewed RN Assessment  Last BM:  PTA  Height:  Ht  Readings from Last 1 Encounters:  01/18/21 5\' 3"  (1.6 m)   Weight:  Wt Readings from Last 1 Encounters:  01/18/21 45 kg   BMI:  There is no height or weight on file to calculate BMI.  Estimated Nutritional Needs:  Kcal:  1600-1800 Protein:  80-90 grams Fluid:  >1.6L/d    03/20/21, MS, RD, LDN (she/her/hers) RD pager number and weekend/on-call pager number located in Amion.

## 2021-01-23 NOTE — Progress Notes (Signed)
  Echocardiogram 2D Echocardiogram has been performed.  Beverly Li M 01/23/2021, 10:08 AM

## 2021-01-23 NOTE — Progress Notes (Signed)
Progress Note  Patient Name: Beverly Li Date of Encounter: 01/23/2021  [facility] Cardiologist: Donato Schultz, MD   Subjective   NO new complaints. Denies angina and dyspnea.  Inpatient Medications    Scheduled Meds:   stroke: mapping our early stages of recovery book   Does not apply Once   amiodarone  200 mg Per Tube Daily   apixaban  5 mg Per Tube BID   budesonide  0.5 mg Nebulization BID   buPROPion  75 mg Per Tube BID   ezetimibe  10 mg Per Tube Daily   free water  250 mL Per Tube Q4H   metoprolol tartrate  50 mg Per Tube BID   pantoprazole sodium  40 mg Per Tube BID   Continuous Infusions:  PRN Meds: acetaminophen, alum & mag hydroxide-simeth, ipratropium-albuterol, labetalol, polyethylene glycol   Vital Signs    Vitals:   01/23/21 0043 01/23/21 0431 01/23/21 0504 01/23/21 0735  BP: (!) 167/97 (!) 181/105 (!) 175/109 (!) 158/86  Pulse: 75 77 70 80  Resp: 19 16    Temp: 97.7 F (36.5 C) (!) 97.5 F (36.4 C)  97.9 F (36.6 C)  TempSrc: Oral Axillary  Oral  SpO2: 100% 100%  100%    Intake/Output Summary (Last 24 hours) at 01/23/2021 1136 Last data filed at 01/23/2021 0400 Gross per 24 hour  Intake 750 ml  Output --  Net 750 ml   Last 3 Weights 01/18/2021 01/16/2021 01/14/2021  Weight (lbs) 99 lb 1.6 oz 99 lb 96 lb 12.5 oz  Weight (kg) 44.951 kg 44.906 kg 43.9 kg      Telemetry    AFib, rate controlled - Personally Reviewed  ECG    No new tracing - Personally Reviewed  Physical Exam  Aphasia and dense R hemiplegia and facial droop GEN: No acute distress.   Neck: No JVD Cardiac: irregular, 1-2/6 systolic murmur at lower sternal borders, no diastolic murmurs, rubs, or gallops.  Respiratory: Clear to auscultation bilaterally. GI: Soft, nontender, non-distended  MS: No edema; No deformity. Neuro:  Nonfocal  Psych: Normal affect   Labs    High Sensitivity Troponin:   Recent Labs  Lab 01/22/21 0651 01/22/21 1115  TROPONINIHS 132*  127*     Chemistry Recent Labs  Lab 01/22/21 0651 01/22/21 0658 01/23/21 0259  NA 139 138 135  K 4.3 3.9 3.6  CL 98 98 94*  CO2 33*  --  28  GLUCOSE 104* 106* 85  BUN 16 19 14   CREATININE 0.98 0.90 0.85  CALCIUM 9.0  --  8.7*  MG  --   --  2.0  PROT 6.1*  --  5.6*  ALBUMIN 3.4*  --  3.1*  AST 39  --  28  ALT 38  --  34  ALKPHOS 68  --  57  BILITOT 1.3*  --  1.5*  GFRNONAA >60  --  >60  ANIONGAP 8  --  13    Lipids No results for input(s): CHOL, TRIG, HDL, LABVLDL, LDLCALC, CHOLHDL in the last 168 hours.  Hematology Recent Labs  Lab 01/22/21 0651 01/22/21 0658 01/23/21 0259  WBC 10.7*  --  8.9  RBC 5.02  --  4.82  HGB 15.5* 16.7* 14.5  HCT 48.4* 49.0* 45.8  MCV 96.4  --  95.0  MCH 30.9  --  30.1  MCHC 32.0  --  31.7  RDW 14.9  --  14.6  PLT 237  --  195  Thyroid No results for input(s): TSH, FREET4 in the last 168 hours.  BNPNo results for input(s): BNP, PROBNP in the last 168 hours.  DDimer No results for input(s): DDIMER in the last 168 hours.   Radiology    MR BRAIN WO CONTRAST  Result Date: 01/22/2021 CLINICAL DATA:  Transient ischemic attack EXAM: MRI HEAD WITHOUT CONTRAST TECHNIQUE: Multiplanar, multiecho pulse sequences of the brain and surrounding structures were obtained without intravenous contrast. COMPARISON:  Same day CT/CTA head and neck, MR head 11/14/2020 FINDINGS: Brain: There is diffusion restriction with associated T2/FLAIR signal abnormality in the left ACA and MCA distribution, some of which was present on the prior study from 11/14/2020 and has decreased in conspicuity; however, there is increased diffusion restriction in the left basal ganglia, internal and external capsules, extending to the caudate body/corona radiata. Findings suspicious for acute infarct superimposed on evolving subacute infarcts. Curvilinear SWI signal dropout along the left precentral sulcus may reflect petechial hemorrhage. There is an additional focus of SWI signal  dropout in the left superior frontal gyrus which may reflect additional petechial hemorrhage versus punctate chronic microhemorrhage. Vascular: The right ICA flow void is absent through the communicating segment, in keeping with the findings on the prior CTA neck. Skull and upper cervical spine: Normal marrow signal. Sinuses/Orbits: There is mild mucosal thickening in the paranasal sinuses. The globes and orbits are unremarkable. Other: None. IMPRESSION: 1. New/increased diffusion restriction in the left basal ganglia, internal and external capsules, extending to the caudate body/corona radiata compared to the study from 11/14/2020 consistent with acute to subacute infarct. 2. Additional diffusion restriction in the left MCA and ACA territories consistent with evolving subacute infarct as seen on the study from 11/14/2020. 3. Curvilinear SWI signal dropout along the left precentral sulcus consistent with petechial hemorrhage. There is an additional focus of petechial hemorrhage versus punctate chronic microhemorrhage in the left superior frontal gyrus. Electronically Signed   By: Lesia Hausen M.D.   On: 01/22/2021 10:20   ECHOCARDIOGRAM COMPLETE  Result Date: 01/23/2021    ECHOCARDIOGRAM REPORT   Patient Name:   Beverly Li Platts Date of Exam: 01/23/2021 Medical Rec #:  [id number]       Height:       63.0 in Accession #:    [id number]      Weight:       99.1 lb Date of Birth:  Feb 18, 1946       BSA:          1.435 m Patient Age:    75 years        BP:           158/86 mmHg Patient Gender: F               HR:           80 bpm. Exam Location:  Inpatient Procedure: 2D Echo, Cardiac Doppler and Color Doppler Indications:    Abnormal ECG R94.31  History:        Patient has prior history of Echocardiogram examinations, most                 recent 11/14/2020. CHF, PAD, Arrythmias:Atrial Fibrillation; Risk                 Factors:Sleep Apnea and Hypertension. Hypertrophic                 cardiomyopathy.  Sonographer:     Leta Jungling RDCS Referring Phys: [phone] LINDSAY B ROBERTS IMPRESSIONS  1. Left ventricular ejection fraction, by estimation, is 60%. The left ventricle has normal function. The left ventricle has no regional wall motion abnormalities. There is severe left ventricular hypertrophy. Peak gradient of 2 mm Hg; there is no evidence of LVOT Obstruction in this study. No evidence of apical aneursysm. Left ventricular diastolic parameters are indeterminate.  2. Right ventricular systolic function is mildly reduced. The right ventricular size is normal. There is normal pulmonary artery systolic pressure.  3. Left atrial size was mildly dilated.  4. Right atrial size was moderately dilated.  5. The mitral valve is degenerative. Mild mitral valve regurgitation. The mean mitral valve gradient is 4.0 mmHg with average heart rate of 72 bpm.  6. The aortic valve is tricuspid. There is moderate calcification of the aortic valve. Aortic valve regurgitation is mild. Mild to moderate aortic valve sclerosis/calcification is present, without any evidence of aortic stenosis.  7. Aortic dilatation noted. There is mild dilatation of the ascending aorta, measuring 40 mm. Comparison(s): A prior study was performed on 11/14/2020. No significant change from prior study. FINDINGS  Left Ventricle: Left ventricular ejection fraction, by estimation, is 60%. The left ventricle has normal function. The left ventricle has no regional wall motion abnormalities. The left ventricular internal cavity size was small. There is severe left ventricular hypertrophy. Left ventricular diastolic parameters are indeterminate. Right Ventricle: The right ventricular size is normal. Right vetricular wall thickness was not well visualized. Right ventricular systolic function is mildly reduced. There is normal pulmonary artery systolic pressure. The tricuspid regurgitant velocity is 2.62 m/s, and with an assumed right atrial pressure of 8 mmHg, the estimated right  ventricular systolic pressure is 35.5 mmHg. Left Atrium: Left atrial size was mildly dilated. Right Atrium: Right atrial size was moderately dilated. Pericardium: There is no evidence of pericardial effusion. Mitral Valve: The mitral valve is degenerative in appearance. Mild mitral valve regurgitation. MV peak gradient, 7.9 mmHg. The mean mitral valve gradient is 4.0 mmHg with average heart rate of 72 bpm. Tricuspid Valve: The tricuspid valve is grossly normal. Tricuspid valve regurgitation is mild . No evidence of tricuspid stenosis. Aortic Valve: The aortic valve is tricuspid. There is moderate calcification of the aortic valve. Aortic valve regurgitation is mild. Aortic regurgitation PHT measures 1128 msec. Mild to moderate aortic valve sclerosis/calcification is present, without any evidence of aortic stenosis. Pulmonic Valve: The pulmonic valve was not well visualized. Pulmonic valve regurgitation is mild to moderate. No evidence of pulmonic stenosis. Aorta: Aortic dilatation noted. There is mild dilatation of the ascending aorta, measuring 40 mm. IAS/Shunts: The atrial septum is grossly normal.  LEFT VENTRICLE PLAX 2D LVIDd:         3.30 cm     Diastology LVIDs:         2.30 cm     LV e' medial:    3.53 cm/s LV PW:         1.30 cm     LV E/e' medial:  27.3 LV IVS:        2.00 cm     LV e' lateral:   5.89 cm/s LVOT diam:     2.00 cm     LV E/e' lateral: 16.4 LV SV:         35 LV SV Index:   25 LVOT Area:     3.14 cm  LV Volumes (MOD) LV vol d, MOD A2C: 69.1 ml LV vol d, MOD A4C: 81.8 ml LV vol s, MOD A2C: 28.1  ml LV vol s, MOD A4C: 35.3 ml LV SV MOD A2C:     41.0 ml LV SV MOD A4C:     81.8 ml LV SV MOD BP:      43.1 ml RIGHT VENTRICLE RV S prime:     8.93 cm/s TAPSE (M-mode): 1.1 cm LEFT ATRIUM             Index        RIGHT ATRIUM           Index LA diam:        4.20 cm 2.93 cm/m   RA Area:     22.10 cm LA Vol (A2C):   43.5 ml 30.32 ml/m  RA Volume:   63.60 ml  44.33 ml/m LA Vol (A4C):   56.0 ml 39.03  ml/m LA Biplane Vol: 53.5 ml 37.29 ml/m  AORTIC VALVE LVOT Vmax:   76.00 cm/s LVOT Vmean:  52.500 cm/s LVOT VTI:    0.112 m AI PHT:      1128 msec  AORTA Ao Root diam: 3.40 cm Ao Asc diam:  3.95 cm MITRAL VALVE               TRICUSPID VALVE MV Area (PHT): 4.65 cm    TR Peak grad:   27.5 mmHg MV Area VTI:   1.52 cm    TR Vmax:        262.00 cm/s MV Peak grad:  7.9 mmHg MV Mean grad:  4.0 mmHg    SHUNTS MV Vmax:       1.40 m/s    Systemic VTI:  0.11 m MV Vmean:      88.9 cm/s   Systemic Diam: 2.00 cm MV Decel Time: 163 msec MV E velocity: 96.43 cm/s Riley Lam MD Electronically signed by Riley Lam MD Signature Date/Time: 01/23/2021/10:36:35 AM    Final    CT HEAD CODE STROKE WO CONTRAST  Result Date: 01/22/2021 CLINICAL DATA:  Code stroke. 75 year old female with neurologic deficit. Left ACA and MCA territory infarcts in August. EXAM: CT HEAD WITHOUT CONTRAST TECHNIQUE: Contiguous axial images were obtained from the base of the skull through the vertex without intravenous contrast. COMPARISON:  Brain MRI 11/14/2020, head CT 11/13/2020. FINDINGS: Brain: Hypodensity in the left MCA and ACA territories corresponding to restricted diffusion last month, resembling developing encephalomalacia. No new areas of cytotoxic edema identified. Heterogeneity in the right deep gray matter nuclei and cerebellum compatible with small chronic infarcts appears stable. Additional patchy bilateral white matter hypodensity appears stable to FLAIR signal abnormality last month. No midline shift, ventriculomegaly, mass effect, evidence of mass lesion, intracranial hemorrhage or evidence of cortically based acute infarction. Vascular: Calcified atherosclerosis at the skull base. No new hyperdense vessel identified. Skull: No acute osseous abnormality identified. Sinuses/Orbits: Visualized paranasal sinuses and mastoids are stable and well aerated. Other: Stable visible face, orbit, and scalp soft tissues. ASPECTS  [facility] Stroke Program Early CT Score) Total score (0-10 with 10 being normal): 10, when allowing for evolution of left MCA and ACA infarcts since August. IMPRESSION: 1. Expected evolution of the left MCA and ACA territory infarcts since last month. Underlying chronic small vessel disease. 2. No new cortically based infarct or acute intracranial hemorrhage identified. ASPECTS 10. 3. These results were communicated to Drs. Lindzen and Collins at [employer] am on 01/22/2021 by text page via the [employer] system. Electronically Signed   By: Odessa Fleming M.D.   On: 01/22/2021 07:13   CT ANGIO HEAD  NECK W WO CM W PERF (CODE STROKE)  Result Date: 01/22/2021 CLINICAL DATA:  75 year old female code stroke. Status post left M1 occlusion, left ACA stenosis, ACA and MCA infarcts in August. EXAM: CT ANGIOGRAPHY HEAD AND NECK CT PERFUSION BRAIN TECHNIQUE: Multidetector CT imaging of the head and neck was performed using the standard protocol during bolus administration of intravenous contrast. Multiplanar CT image reconstructions and MIPs were obtained to evaluate the vascular anatomy. Carotid stenosis measurements (when applicable) are obtained utilizing NASCET criteria, using the distal internal carotid diameter as the denominator. Multiphase CT imaging of the brain was performed following IV bolus contrast injection. Subsequent parametric perfusion maps were calculated using RAPID software. CONTRAST:  100 mL Omnipaque 350 COMPARISON:  Plain head CT 0659 hours today.  Brain MRI 11/14/2020. CTA head and neck 11/13/2020. FINDINGS: CT Brain Perfusion Findings: ASPECTS: 10 (of all vein left MCA and ACA infarcts since August). CBF (<30%) Volume: 0 Perfusion (Tmax>6.0s) volume: 0 Mismatch Volume: Not applicable Infarction Location:Not applicable CTA NECK Skeleton: Stable.  No acute osseous abnormality identified. Upper chest: Stable upper lung septal thickening with mild to moderate centrilobular emphysema. No superior mediastinal  lymphadenopathy identified. Other neck: Stable. Aortic arch: Calcified aortic atherosclerosis. Three vessel arch configuration better demonstrated on the August CTA. Right carotid system: Visible brachiocephalic artery and right CCA origin are patent with plaque but no significant stenosis. 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 As before, no right ICA reconstitution to the skull base. Left carotid system: Stable left CCA plaque without stenosis. Bulky calcified plaque at the left ICA origin and bulb is stable with stenosis numerically estimated at 70 % with respect to the distal vessel. The left ICA remains patent to the skull base without additional stenosis. Vertebral arteries: Stable proximal right subclavian and right vertebral artery plaque with moderate stenosis of the non dominant right vertebral artery origin. The right vertebral remains patent to the skull base, diminutive, but with no additional stenosis. Stable proximal left subclavian artery plaque without stenosis. Mild calcified plaque at the left vertebral artery origin without stenosis. Dominant left vertebral with tortuous V1 segment. The left vertebral remains patent to the skull base without stenosis. CTA HEAD Posterior circulation: Non dominant right vertebral artery continues to the vertebrobasilar junction without stenosis. Right AICA appears dominant and patent. Dominant left vertebral V4 segment calcified plaque with mild to moderate stenosis appears stable (series 7, image 2 95). Stenosis is proximal to the normal left PICA origin and vertebrobasilar junction. Patent basilar artery with mild irregularity but no stenosis. Patent SCA and left PCA origins. Fetal type right PCA origin but with patent communication to the right SCA as before. Left posterior communicating is diminutive or absent. Bilateral PCA branches are stable and within normal limits. Anterior circulation:  Right ICA terminus reconstituted from the posterior communicating artery as before. No right ICA siphon enhancement. Right MCA and ACA origins remain patent and within normal limits. Left ICA siphon calcified plaque with mild to moderate supraclinoid stenosis is stable. Patent left MCA and ACA origins. Normal anterior communicating artery. ACA A2 segments are patent without stenosis. Improved appearance of distal left ACA branches since August. No discrete branch occlusion now. Left MCA M1 segment has recanalized and is patent to the trifurcation without stenosis. Some left MCA branches are attenuated concordant with the recent infarction. No M2 branch occlusion is evident. Right MCA M1 segment and bifurcation are patent without stenosis.  Right MCA branches appear stable and within normal limits. Venous sinuses: Early contrast timing, grossly patent. Anatomic variants: Dominant left vertebral artery. Fetal type right PCA origin. Review of the MIP images confirms the above findings IMPRESSION: 1. Negative for emergent large vessel occlusion and no infarct core or ischemic penumbra detected by CT Perfusion. 2. 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 3. Recanalized and or improved Left MCA and distal Left ACA branches since August. 4. Stable atherosclerosis notable for: - high-grade proximal Left ICA stenosis estimated at 70%, due to bulky calcified plaque. - up to moderate bilateral vertebral artery stenosis (dominant left vertebral V4 segment and non dominant right vertebral artery origin). - mild to moderate supraclinoid Left ICA siphon stenosis due to calcified plaque. 5. Aortic Atherosclerosis (ICD10-I70.0). Emphysema (ICD10-J43.9). Salient findings discussed by telephone with Dr. Caryl Pina on 01/22/2021 at 07:36 . Electronically Signed   By: Odessa Fleming M.D.   On: 01/22/2021 07:41    Cardiac Studies   ECHO  01/23/2021   1. Left ventricular ejection fraction, by estimation, is 60%. The left  ventricle has normal function. The left ventricle has no regional wall  motion abnormalities. There is severe left ventricular hypertrophy. Peak  gradient of 2 mm Hg; there is no  evidence of LVOT Obstruction in this study. No evidence of apical  aneursysm. Left ventricular diastolic parameters are indeterminate.   2. Right ventricular systolic function is mildly reduced. The right  ventricular size is normal. There is normal pulmonary artery systolic  pressure.   3. Left atrial size was mildly dilated.   4. Right atrial size was moderately dilated.   5. The mitral valve is degenerative. Mild mitral valve regurgitation. The  mean mitral valve gradient is 4.0 mmHg with average heart rate of 72 bpm.   6. The aortic valve is tricuspid. There is moderate calcification of the  aortic valve. Aortic valve regurgitation is mild. Mild to moderate aortic  valve sclerosis/calcification is present, without any evidence of aortic  stenosis.   7. Aortic dilatation noted. There is mild dilatation of the ascending  aorta, measuring 40 mm.   Patient Profile     75 y.o. female w persistent atrial fibrillation, recurrent strokes, hypertrophic CMP, moderate nonobstructive CAD, admitted with extension of large L hemispheric stroke and concern for new ECG changes  Assessment & Plan    Echo shows normal regional wall motion and LVEF. Retrospectively, I think the ECGs from August, when she had a major CVA were the ones that were changed from baseline. Current ECG is similar to the older tracings. She has prominent HCM-related secondary ST-T changes that "pseudonormalized" around the time of her major stroke in August. No further cardiac investigations planned at this time. [facility] will sign off.   Medication Recommendations:  no change in her cardiac meds Other recommendations (labs, testing, etc):  n/a Follow up  as an outpatient:  as previously scheduled  For questions or updates, please contact [facility] Please consult [email or web address] for contact info under        Signed, Thurmon Fair, MD  01/23/2021, 11:36 AM

## 2021-01-23 NOTE — Discharge Instructions (Signed)

## 2021-01-24 NOTE — Progress Notes (Signed)
Report called to facility

## 2021-01-29 ENCOUNTER — Non-Acute Institutional Stay (SKILLED_NURSING_FACILITY): Payer: Medicare Other | Admitting: Internal Medicine

## 2021-01-29 ENCOUNTER — Encounter: Payer: Self-pay | Admitting: Internal Medicine

## 2021-01-29 DIAGNOSIS — I69391 Dysphagia following cerebral infarction: Secondary | ICD-10-CM

## 2021-01-29 DIAGNOSIS — R278 Other lack of coordination: Secondary | ICD-10-CM | POA: Diagnosis not present

## 2021-01-29 DIAGNOSIS — J439 Emphysema, unspecified: Secondary | ICD-10-CM | POA: Diagnosis not present

## 2021-01-29 DIAGNOSIS — I480 Paroxysmal atrial fibrillation: Secondary | ICD-10-CM

## 2021-01-29 DIAGNOSIS — I6602 Occlusion and stenosis of left middle cerebral artery: Secondary | ICD-10-CM | POA: Diagnosis not present

## 2021-01-29 DIAGNOSIS — K219 Gastro-esophageal reflux disease without esophagitis: Secondary | ICD-10-CM | POA: Diagnosis not present

## 2021-01-29 DIAGNOSIS — E782 Mixed hyperlipidemia: Secondary | ICD-10-CM | POA: Diagnosis not present

## 2021-01-29 DIAGNOSIS — R2689 Other abnormalities of gait and mobility: Secondary | ICD-10-CM | POA: Diagnosis not present

## 2021-01-29 DIAGNOSIS — I63312 Cerebral infarction due to thrombosis of left middle cerebral artery: Secondary | ICD-10-CM | POA: Diagnosis not present

## 2021-01-29 DIAGNOSIS — I69390 Apraxia following cerebral infarction: Secondary | ICD-10-CM | POA: Diagnosis not present

## 2021-01-29 DIAGNOSIS — I10 Essential (primary) hypertension: Secondary | ICD-10-CM | POA: Diagnosis not present

## 2021-01-29 DIAGNOSIS — I69351 Hemiplegia and hemiparesis following cerebral infarction affecting right dominant side: Secondary | ICD-10-CM | POA: Diagnosis not present

## 2021-01-29 DIAGNOSIS — R0609 Other forms of dyspnea: Secondary | ICD-10-CM

## 2021-01-29 DIAGNOSIS — F339 Major depressive disorder, recurrent, unspecified: Secondary | ICD-10-CM

## 2021-01-29 DIAGNOSIS — I69359 Hemiplegia and hemiparesis following cerebral infarction affecting unspecified side: Secondary | ICD-10-CM

## 2021-01-29 DIAGNOSIS — I69320 Aphasia following cerebral infarction: Secondary | ICD-10-CM | POA: Diagnosis not present

## 2021-01-29 DIAGNOSIS — R0602 Shortness of breath: Secondary | ICD-10-CM | POA: Diagnosis not present

## 2021-01-29 DIAGNOSIS — M62561 Muscle wasting and atrophy, not elsewhere classified, right lower leg: Secondary | ICD-10-CM | POA: Diagnosis not present

## 2021-01-29 NOTE — Progress Notes (Signed)
Provider:  Einar Crow MD  Location:   [facility] Room Number: 116 Place of Service:  SNF ([phone])  PCP: [name], MD Patient Care Team: [name], MD as PCP - General (Internal Medicine) [name], MD as PCP - Cardiology (Cardiology) Duke Salvia, MD as PCP - Electrophysiology (Cardiology) Shirlean Kelly, MD as Consulting Physician (Neurosurgery)  Extended Emergency Contact Information Primary Emergency Contact: Kernes,[name] Address: [address]          [place] [phone] [name] Home Phone: [phone] Mobile Phone: [phone] Relation: Spouse Secondary Emergency Contact: Hersch,[name] Address: [address]          [place], [place] [address] [name] Mobile Phone: [phone] Relation: Son  Code Status: Full Code Goals of Care: Advanced Directive information Advanced Directives 01/29/2021  Does Patient Have a Medical Advance Directive? Yes  Type of Advance Directive Living will  Does patient want to make changes to medical advance directive? No - Patient declined  Copy of Healthcare Power of Attorney in Chart? -  Would patient like information on creating a medical advance directive? -      Chief Complaint  Patient presents with   New Admit To SNF    Readmit to SNF    HPI: Patient is a 75 y.o. 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  Patient was off her Eliquis at that time due to Hematoma. She had thrombectomy performed by IR. Also had PEG tube placed for her severe Dysphagia   Patient was send to the hospital for worsening Symptoms of her speech and Right sided weakness MRI showed above new findings No New Recommendation by Neurology Continue Eliquis for now. Cardiology did Echo which was no change from before . 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 Now on Oxygen Cannot give much history due to Aphasia but did not complain of Cough Chest pain or SOB . No Fever Husband very upset as her BP has been high here and Nurses have been using Hydralazine Prn      Past Medical History:  Diagnosis Date   Anxiety    Arthritis    "some in my lower back; probably elbows, knees" (11/18/2017)   Atrial fibrillation (HCC)    Bell's palsy    when pt. was 75 yrs old, when under stress the left side of face will droop.   Complication of anesthesia    "vascular OR 2016; BP bottomed out; couldn't get it regulated; ended up in ICU for DAYS" (11/18/2017)   GERD (gastroesophageal reflux disease)    History of kidney stones    Hypertension    Hypertrophic  cardiomyopathy (HCC)    severe LV basilar hypertrophy witn no evidence of significant outflow tract obstruction, EF 65-70%, mild LAE, mild TR, grade 1a diastolic dysfunction 05/15/10 (Dr. Donato Schultz) (Atrial Septal Hypertrophy pattern)-- Intra-op TEE with dsignificant outflow tract obstruction - AI, MR & TR   Insomnia    Mild aortic sclerosis    Osteopenia    Peripheral vascular disease (HCC)    Syncope    , Vagal   Past Surgical History:  Procedure Laterality Date   AUGMENTATION MAMMAPLASTY Bilateral    BACK SURGERY     CARDIAC  CATHETERIZATION N/A 05/07/2016   Procedure: Left Heart Cath and Coronary Angiography;  Surgeon: Kathleene Hazel, MD;  Location: [facility] INVASIVE CV LAB;  Service: Cardiovascular;  Laterality: N/A;   CARDIOVERSION N/A 09/24/2017   Procedure: CARDIOVERSION;  Surgeon: Laurey Morale, MD;  Location: [facility] ENDOSCOPY;  Service: Cardiovascular;  Laterality: N/A;   DILATION AND CURETTAGE OF UTERUS     ENDARTERECTOMY FEMORAL Right 03/02/2015   Procedure: ENDARTERECTOMY RIGHT FEMORAL;  Surgeon: Chuck Hint, MD;  Location: [facility] OR;  Service: Vascular;  Laterality: Right;   ESOPHAGOGASTRODUODENOSCOPY (EGD) WITH PROPOFOL N/A 12/01/2020   Procedure: ESOPHAGOGASTRODUODENOSCOPY (EGD) WITH PROPOFOL;  Surgeon: Diamantina Monks, MD;  Location: [facility] ENDOSCOPY;  Service: General;  Laterality: N/A;   FACIAL COSMETIC SURGERY Left 2002   "related to Bell's Palsy @ age 52; left eye/side of face droopy; tried to make area symmetrical"   FEMORAL-POPLITEAL BYPASS GRAFT Right 03/02/2015   Procedure: BYPASS GRAFT FEMORAL-BELOW KNEE POPLITEAL ARTERY;  Surgeon: Chuck Hint, MD;  Location: [facility] OR;  Service: Vascular;  Laterality: Right;   INGUINAL HERNIA REPAIR Bilateral 2002   IR ANGIO INTRA EXTRACRAN SEL COM CAROTID INNOMINATE UNI L MOD SED  11/16/2020   IR CT HEAD LTD  11/13/2020   IR PERCUTANEOUS ART THROMBECTOMY/INFUSION INTRACRANIAL INC DIAG ANGIO  11/13/2020   OVARIAN CYST REMOVAL Left    PEG PLACEMENT N/A 12/01/2020   Procedure: PERCUTANEOUS ENDOSCOPIC GASTROSTOMY (PEG) PLACEMENT;  Surgeon: Diamantina Monks, MD;  Location: [facility] ENDOSCOPY;  Service: General;  Laterality: N/A;   PERIPHERAL VASCULAR CATHETERIZATION N/A 01/16/2015   Procedure: Abdominal Aortogram;  Surgeon: Chuck Hint, MD;  Location: [facility] INVASIVE CV LAB;  Service: Cardiovascular;  Laterality: N/A;   POSTERIOR LUMBAR FUSION  2015   "have plates and screws in there"   RADIOLOGY WITH ANESTHESIA N/A 11/13/2020   Procedure: IR WITH ANESTHESIA;   Surgeon: Radiologist, Medication, MD;  Location: [facility] OR;  Service: Radiology;  Laterality: N/A;   TONSILLECTOMY      reports that she quit smoking about 6 years ago. Her smoking use included cigarettes. She has a 50.00 pack-year smoking history. She has never used smokeless tobacco. She reports current alcohol use. No history on file for drug use. Social History   Socioeconomic History   Marital status: Married    Spouse name: Not on file   Number of children: Not on file   Years of education: Not on file   Highest education level: Not on file  Occupational History   Not on file  Tobacco Use   Smoking status: Former    Packs/day: 1.00    Years: 50.00    Pack years: 50.00    Types: Cigarettes    Quit date: 12/15/2014    Years since quitting: 6.1   Smokeless tobacco: Never  Vaping Use   Vaping Use: Never used  Substance and Sexual Activity   Alcohol use: Yes  Comment: 11/18/2017 "might have a couple glasses of wine/month; if that"   Drug use: Not on file   Sexual activity: Not Currently  Other Topics Concern   Not on file  Social History Narrative   Not on file   Social Determinants of Health   Financial Resource Strain: Not on file  Food Insecurity: Not on file  Transportation Needs: Not on file  Physical Activity: Not on file  Stress: Not on file  Social Connections: Not on file  Intimate Partner Violence: Not on file    Functional Status Survey:    Family History  Problem Relation Age of Onset   Liver cancer Mother    Cancer Mother        Liver   Hypertension Mother    Lung cancer Father    Cancer Father        Lung   Breast cancer Sister    Cancer Sister        Breast    Health Maintenance  Topic Date Due   Hepatitis C Screening  Never done   Zoster Vaccines- Shingrix (1 of 2) Never done   COLONOSCOPY (Pts 45-42yrs Insurance coverage will need to be confirmed)  Never done   DEXA SCAN  Never done   INFLUENZA VACCINE  11/13/2020   COVID-19 Vaccine (5  - Booster for Pfizer series) 01/18/2021   TETANUS/TDAP  05/20/2023   HPV VACCINES  Aged Out    Allergies  Allergen Reactions   Amoxicillin Other (See Comments)    UTI Has patient had a PCN reaction causing immediate rash, facial/tongue/throat swelling, SOB or lightheadedness with hypotension: No Has patient had a PCN reaction causing severe rash involving mucus membranes or skin necrosis: No Has patient had a PCN reaction that required hospitalization: No Has patient had a PCN reaction occurring within the last 10 years: Yes--UTI ONLY If all of the above answers are "NO", then may proceed with Cephalosporin use.    Atenolol Cough   Crestor [Rosuvastatin Calcium] Other (See Comments)    Muscle aches   Pravastatin Other (See Comments)    Muscle aches   Sulfa Antibiotics Nausea Only   Codeine     hallucinations    Allergies as of 01/29/2021       Reactions   Amoxicillin Other (See Comments)   UTI Has patient had a PCN reaction causing immediate rash, facial/tongue/throat swelling, SOB or lightheadedness with hypotension: No Has patient had a PCN reaction causing severe rash involving mucus membranes or skin necrosis: No Has patient had a PCN reaction that required hospitalization: No Has patient had a PCN reaction occurring within the last 10 years: Yes--UTI ONLY If all of the above answers are "NO", then may proceed with Cephalosporin use.   Atenolol Cough   Crestor [rosuvastatin Calcium] Other (See Comments)   Muscle aches   Pravastatin Other (See Comments)   Muscle aches   Sulfa Antibiotics Nausea Only   Codeine    hallucinations        Medication List        Accurate as of January 29, 2021 11:28 AM. If you have any questions, ask your nurse or doctor.          STOP taking these medications    aluminum-magnesium hydroxide-simethicone 200-200-20 MG/5ML Susp Commonly known as: MAALOX Stopped by: [name], MD       TAKE these medications     acetaminophen 325 MG tablet Commonly known as: TYLENOL Take 650 mg  by mouth every 4 (four) hours as needed for mild pain.   amiodarone 200 MG tablet Commonly known as: PACERONE Place 1 tablet (200 mg total) into feeding tube daily.   apixaban 5 MG Tabs tablet Commonly known as: ELIQUIS Place 1 tablet (5 mg total) into feeding tube 2 (two) times daily.   budesonide 0.5 MG/2ML nebulizer solution Commonly known as: PULMICORT Take 2 mLs (0.5 mg total) by nebulization 2 (two) times daily.   buPROPion 75 MG tablet Commonly known as: WELLBUTRIN Place 1 tablet (75 mg total) into feeding tube 2 (two) times daily.   ezetimibe 10 MG tablet Commonly known as: ZETIA Place 1 tablet (10 mg total) into feeding tube daily.   free water Soln Place 250 mLs into feeding tube 6 (six) times daily.   hydrALAZINE 10 MG tablet Commonly known as: APRESOLINE Take 10 mg by mouth 3 (three) times daily as needed. SBP>165 or DBP >95   ipratropium-albuterol 0.5-2.5 (3) MG/3ML Soln Commonly known as: DUONEB Take 3 mLs by nebulization every 4 (four) hours as needed. What changed: reasons to take this   metoprolol tartrate 50 MG tablet Commonly known as: LOPRESSOR Place 1 tablet (50 mg total) into feeding tube 2 (two) times daily.   pantoprazole sodium 40 mg/20 mL Susp Commonly known as: PROTONIX Place 40 mg into feeding tube 2 (two) times daily.        Review of Systems Denies everything Very difficult to get due to her Dyphagia  Vitals:   01/29/21 1122  BP: (!) 155/91  Pulse: 65  Resp: 20  Temp: (!) 97.5 F (36.4 C)  SpO2: 96%  Weight: 101 lb 9.6 oz (46.1 kg)  Height: 5\' 3"  (1.6 m)   Body mass index is 18 kg/m. Physical Exam Vitals reviewed.  Constitutional:      Comments: Has Right Facial drop  HENT:     Head: Normocephalic.     Nose: Nose normal.     Mouth/Throat:     Mouth: Mucous membranes are dry.     Pharynx: Oropharynx is clear.  Eyes:     Pupils: Pupils are  equal, round, and reactive to light.  Pulmonary:     Comments: Looks like increased Respiratory work . Has rales Bilateral Left more then Right No Wheezing Abdominal:     General: Abdomen is flat. Bowel sounds are normal.     Palpations: Abdomen is soft.  Musculoskeletal:        General: No swelling.  Skin:    General: Skin is warm and dry.  Neurological:     Mental Status: She is alert.     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  Psychiatric:     Comments: Gets Frustrated easily Dysphoric Mood    Labs reviewed: Basic Metabolic Panel: Recent Labs    11/29/20 0128 11/30/20 12/02/20 12/02/20 0216 12/04/20 0052 01/15/21 0603 01/22/21 03/24/21 01/22/21 0658 01/23/21 0259  NA 151*   < > 140   < > 142 139 138 135  K 4.4   < > 4.2   < > 3.6 4.3 3.9 3.6  CL 113*   < > 108   < > 104 98 98 94*  CO2 30   < > 25   < > 29 33*  --  28  GLUCOSE 140*   < > 181*   < > 93 104* 106* 85  BUN 60*   < > 51*   < > 26* 16 19  14  CREATININE 0.75   < > 0.79   < > 0.82 0.98 0.90 0.85  CALCIUM 8.9   < > 8.4*   < > 9.1 9.0  --  8.7*  MG 2.3  --  2.2  --   --   --   --  2.0  PHOS 3.4  --  3.0  --   --   --   --  4.1   < > = values in this interval not displayed.   Liver Function Tests: Recent Labs    12/22/20 0409 01/22/21 0651 01/23/21 0259  AST 35 39 28  ALT 47* 38 34  ALKPHOS 69 68 57  BILITOT 0.6 1.3* 1.5*  PROT 5.6* 6.1* 5.6*  ALBUMIN 2.6* 3.4* 3.1*   No results for input(s): LIPASE, AMYLASE in the last 8760 hours. No results for input(s): AMMONIA in the last 8760 hours. CBC: Recent Labs    01/08/21 0810 01/15/21 0603 01/22/21 0651 01/22/21 0658 01/23/21 0259  WBC 7.8 8.8 10.7*  --  8.9  NEUTROABS 4.8 5.5 7.7  --   --   HGB 16.6* 15.3* 15.5* 16.7* 14.5  HCT 51.6* 47.5* 48.4* 49.0* 45.8  MCV 95.9 95.2 96.4  --  95.0  PLT 283 234 237  --  195   Cardiac Enzymes: No results for input(s): CKTOTAL, CKMB, CKMBINDEX, TROPONINI in the last 8760  hours. BNP: Invalid input(s): POCBNP Lab Results  Component Value Date   HGBA1C 6.0 (H) 11/13/2020   Lab Results  Component Value Date   TSH 1.087 11/18/2020   No results found for: VITAMINB12 No results found for: FOLATE No results found for: IRON, TIBC, FERRITIN  Imaging and Procedures obtained prior to SNF admission: MR BRAIN WO CONTRAST  Result Date: 01/22/2021 CLINICAL DATA:  Transient ischemic attack EXAM: MRI HEAD WITHOUT CONTRAST TECHNIQUE: Multiplanar, multiecho pulse sequences of the brain and surrounding structures were obtained without intravenous contrast. COMPARISON:  Same day CT/CTA head and neck, MR head 11/14/2020 FINDINGS: Brain: There is diffusion restriction with associated T2/FLAIR signal abnormality in the left ACA and MCA distribution, some of which was present on the prior study from 11/14/2020 and has decreased in conspicuity; however, there is increased diffusion restriction in the left basal ganglia, internal and external capsules, extending to the caudate body/corona radiata. Findings suspicious for acute infarct superimposed on evolving subacute infarcts. Curvilinear SWI signal dropout along the left precentral sulcus may reflect petechial hemorrhage. There is an additional focus of SWI signal dropout in the left superior frontal gyrus which may reflect additional petechial hemorrhage versus punctate chronic microhemorrhage. Vascular: The right ICA flow void is absent through the communicating segment, in keeping with the findings on the prior CTA neck. Skull and upper cervical spine: Normal marrow signal. Sinuses/Orbits: There is mild mucosal thickening in the paranasal sinuses. The globes and orbits are unremarkable. Other: None. IMPRESSION: 1. New/increased diffusion restriction in the left basal ganglia, internal and external capsules, extending to the caudate body/corona radiata compared to the study from 11/14/2020 consistent with acute to subacute infarct. 2.  Additional diffusion restriction in the left MCA and ACA territories consistent with evolving subacute infarct as seen on the study from 11/14/2020. 3. Curvilinear SWI signal dropout along the left precentral sulcus consistent with petechial hemorrhage. There is an additional focus of petechial hemorrhage versus punctate chronic microhemorrhage in the left superior frontal gyrus. Electronically Signed   By: Lesia Hausen M.D.   On: 01/22/2021 10:20   ECHOCARDIOGRAM  COMPLETE  Result Date: 01/23/2021    ECHOCARDIOGRAM REPORT   Patient Name:   Beverly Li Narula Date of Exam: 01/23/2021 Medical Rec #:  [id number]       Height:       63.0 in Accession #:    [id number]      Weight:       99.1 lb Date of Birth:  1945-08-30       BSA:          1.435 m Patient Age:    75 years        BP:           158/86 mmHg Patient Gender: F               HR:           80 bpm. Exam Location:  Inpatient Procedure: 2D Echo, Cardiac Doppler and Color Doppler Indications:    Abnormal ECG R94.31  History:        Patient has prior history of Echocardiogram examinations, most                 recent 11/14/2020. CHF, PAD, Arrythmias:Atrial Fibrillation; Risk                 Factors:Sleep Apnea and Hypertension. Hypertrophic                 cardiomyopathy.  Sonographer:    Leta Jungling RDCS Referring Phys: [phone] LINDSAY B ROBERTS IMPRESSIONS  1. Left ventricular ejection fraction, by estimation, is 60%. The left ventricle has normal function. The left ventricle has no regional wall motion abnormalities. There is severe left ventricular hypertrophy. Peak gradient of 2 mm Hg; there is no evidence of LVOT Obstruction in this study. No evidence of apical aneursysm. Left ventricular diastolic parameters are indeterminate.  2. Right ventricular systolic function is mildly reduced. The right ventricular size is normal. There is normal pulmonary artery systolic pressure.  3. Left atrial size was mildly dilated.  4. Right atrial size was moderately dilated.   5. The mitral valve is degenerative. Mild mitral valve regurgitation. The mean mitral valve gradient is 4.0 mmHg with average heart rate of 72 bpm.  6. The aortic valve is tricuspid. There is moderate calcification of the aortic valve. Aortic valve regurgitation is mild. Mild to moderate aortic valve sclerosis/calcification is present, without any evidence of aortic stenosis.  7. Aortic dilatation noted. There is mild dilatation of the ascending aorta, measuring 40 mm. Comparison(s): A prior study was performed on 11/14/2020. No significant change from prior study. FINDINGS  Left Ventricle: Left ventricular ejection fraction, by estimation, is 60%. The left ventricle has normal function. The left ventricle has no regional wall motion abnormalities. The left ventricular internal cavity size was small. There is severe left ventricular hypertrophy. Left ventricular diastolic parameters are indeterminate. Right Ventricle: The right ventricular size is normal. Right vetricular wall thickness was not well visualized. Right ventricular systolic function is mildly reduced. There is normal pulmonary artery systolic pressure. The tricuspid regurgitant velocity is 2.62 m/s, and with an assumed right atrial pressure of 8 mmHg, the estimated right ventricular systolic pressure is 35.5 mmHg. Left Atrium: Left atrial size was mildly dilated. Right Atrium: Right atrial size was moderately dilated. Pericardium: There is no evidence of pericardial effusion. Mitral Valve: The mitral valve is degenerative in appearance. Mild mitral valve regurgitation. MV peak gradient, 7.9 mmHg. The mean mitral valve gradient is 4.0 mmHg with average heart rate of  72 bpm. Tricuspid Valve: The tricuspid valve is grossly normal. Tricuspid valve regurgitation is mild . No evidence of tricuspid stenosis. Aortic Valve: The aortic valve is tricuspid. There is moderate calcification of the aortic valve. Aortic valve regurgitation is mild. Aortic regurgitation  PHT measures 1128 msec. Mild to moderate aortic valve sclerosis/calcification is present, without any evidence of aortic stenosis. Pulmonic Valve: The pulmonic valve was not well visualized. Pulmonic valve regurgitation is mild to moderate. No evidence of pulmonic stenosis. Aorta: Aortic dilatation noted. There is mild dilatation of the ascending aorta, measuring 40 mm. IAS/Shunts: The atrial septum is grossly normal.  LEFT VENTRICLE PLAX 2D LVIDd:         3.30 cm     Diastology LVIDs:         2.30 cm     LV e' medial:    3.53 cm/s LV PW:         1.30 cm     LV E/e' medial:  27.3 LV IVS:        2.00 cm     LV e' lateral:   5.89 cm/s LVOT diam:     2.00 cm     LV E/e' lateral: 16.4 LV SV:         35 LV SV Index:   25 LVOT Area:     3.14 cm  LV Volumes (MOD) LV vol d, MOD A2C: 69.1 ml LV vol d, MOD A4C: 81.8 ml LV vol s, MOD A2C: 28.1 ml LV vol s, MOD A4C: 35.3 ml LV SV MOD A2C:     41.0 ml LV SV MOD A4C:     81.8 ml LV SV MOD BP:      43.1 ml RIGHT VENTRICLE RV S prime:     8.93 cm/s TAPSE (M-mode): 1.1 cm LEFT ATRIUM             Index        RIGHT ATRIUM           Index LA diam:        4.20 cm 2.93 cm/m   RA Area:     22.10 cm LA Vol (A2C):   43.5 ml 30.32 ml/m  RA Volume:   63.60 ml  44.33 ml/m LA Vol (A4C):   56.0 ml 39.03 ml/m LA Biplane Vol: 53.5 ml 37.29 ml/m  AORTIC VALVE LVOT Vmax:   76.00 cm/s LVOT Vmean:  52.500 cm/s LVOT VTI:    0.112 m AI PHT:      1128 msec  AORTA Ao Root diam: 3.40 cm Ao Asc diam:  3.95 cm MITRAL VALVE               TRICUSPID VALVE MV Area (PHT): 4.65 cm    TR Peak grad:   27.5 mmHg MV Area VTI:   1.52 cm    TR Vmax:        262.00 cm/s MV Peak grad:  7.9 mmHg MV Mean grad:  4.0 mmHg    SHUNTS MV Vmax:       1.40 m/s    Systemic VTI:  0.11 m MV Vmean:      88.9 cm/s   Systemic Diam: 2.00 cm MV Decel Time: 163 msec MV E velocity: 96.43 cm/s Riley Lam MD Electronically signed by Riley Lam MD Signature Date/Time: 01/23/2021/10:36:35 AM    Final    CT HEAD  CODE STROKE WO CONTRAST  Result Date: 01/22/2021 CLINICAL DATA:  Code stroke. 75 year old female with neurologic  deficit. Left ACA and MCA territory infarcts in August. EXAM: CT HEAD WITHOUT CONTRAST TECHNIQUE: Contiguous axial images were obtained from the base of the skull through the vertex without intravenous contrast. COMPARISON:  Brain MRI 11/14/2020, head CT 11/13/2020. FINDINGS: Brain: Hypodensity in the left MCA and ACA territories corresponding to restricted diffusion last month, resembling developing encephalomalacia. No new areas of cytotoxic edema identified. Heterogeneity in the right deep gray matter nuclei and cerebellum compatible with small chronic infarcts appears stable. Additional patchy bilateral white matter hypodensity appears stable to FLAIR signal abnormality last month. No midline shift, ventriculomegaly, mass effect, evidence of mass lesion, intracranial hemorrhage or evidence of cortically based acute infarction. Vascular: Calcified atherosclerosis at the skull base. No new hyperdense vessel identified. Skull: No acute osseous abnormality identified. Sinuses/Orbits: Visualized paranasal sinuses and mastoids are stable and well aerated. Other: Stable visible face, orbit, and scalp soft tissues. ASPECTS [facility] Stroke Program Early CT Score) Total score (0-10 with 10 being normal): 10, when allowing for evolution of left MCA and ACA infarcts since August. IMPRESSION: 1. Expected evolution of the left MCA and ACA territory infarcts since last month. Underlying chronic small vessel disease. 2. No new cortically based infarct or acute intracranial hemorrhage identified. ASPECTS 10. 3. These results were communicated to Drs. Lindzen and Collins at [employer] am on 01/22/2021 by text page via the [employer] system. Electronically Signed   By: Odessa Fleming M.D.   On: 01/22/2021 07:13   CT ANGIO HEAD NECK W WO CM W PERF (CODE STROKE)  Result Date: 01/22/2021 CLINICAL DATA:  75 year old female  code stroke. Status post left M1 occlusion, left ACA stenosis, ACA and MCA infarcts in August. EXAM: CT ANGIOGRAPHY HEAD AND NECK CT PERFUSION BRAIN TECHNIQUE: Multidetector CT imaging of the head and neck was performed using the standard protocol during bolus administration of intravenous contrast. Multiplanar CT image reconstructions and MIPs were obtained to evaluate the vascular anatomy. Carotid stenosis measurements (when applicable) are obtained utilizing NASCET criteria, using the distal internal carotid diameter as the denominator. Multiphase CT imaging of the brain was performed following IV bolus contrast injection. Subsequent parametric perfusion maps were calculated using RAPID software. CONTRAST:  100 mL Omnipaque 350 COMPARISON:  Plain head CT 0659 hours today.  Brain MRI 11/14/2020. CTA head and neck 11/13/2020. FINDINGS: CT Brain Perfusion Findings: ASPECTS: 10 (of all vein left MCA and ACA infarcts since August). CBF (<30%) Volume: 0 Perfusion (Tmax>6.0s) volume: 0 Mismatch Volume: Not applicable Infarction Location:Not applicable CTA NECK Skeleton: Stable.  No acute osseous abnormality identified. Upper chest: Stable upper lung septal thickening with mild to moderate centrilobular emphysema. No superior mediastinal lymphadenopathy identified. Other neck: Stable. Aortic arch: Calcified aortic atherosclerosis. Three vessel arch configuration better demonstrated on the August CTA. Right carotid system: Visible brachiocephalic artery and right CCA origin are patent with plaque but no significant stenosis. 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 As before, no right ICA reconstitution to the skull base. Left carotid system: Stable left CCA plaque without stenosis. Bulky calcified plaque at the left ICA origin and bulb is stable with stenosis numerically estimated at 70 % with respect to the distal vessel. The left ICA remains patent to  the skull base without additional stenosis. Vertebral arteries: Stable proximal right subclavian and right vertebral artery plaque with moderate stenosis of the non dominant right vertebral artery origin. The right vertebral remains patent to the skull base,  diminutive, but with no additional stenosis. Stable proximal left subclavian artery plaque without stenosis. Mild calcified plaque at the left vertebral artery origin without stenosis. Dominant left vertebral with tortuous V1 segment. The left vertebral remains patent to the skull base without stenosis. CTA HEAD Posterior circulation: Non dominant right vertebral artery continues to the vertebrobasilar junction without stenosis. Right AICA appears dominant and patent. Dominant left vertebral V4 segment calcified plaque with mild to moderate stenosis appears stable (series 7, image 2 95). Stenosis is proximal to the normal left PICA origin and vertebrobasilar junction. Patent basilar artery with mild irregularity but no stenosis. Patent SCA and left PCA origins. Fetal type right PCA origin but with patent communication to the right SCA as before. Left posterior communicating is diminutive or absent. Bilateral PCA branches are stable and within normal limits. Anterior circulation: Right ICA terminus reconstituted from the posterior communicating artery as before. No right ICA siphon enhancement. Right MCA and ACA origins remain patent and within normal limits. Left ICA siphon calcified plaque with mild to moderate supraclinoid stenosis is stable. Patent left MCA and ACA origins. Normal anterior communicating artery. ACA A2 segments are patent without stenosis. Improved appearance of distal left ACA branches since August. No discrete branch occlusion now. Left MCA M1 segment has recanalized and is patent to the trifurcation without stenosis. Some left MCA branches are attenuated concordant with the recent infarction. No M2 branch occlusion is evident. Right MCA M1  segment and bifurcation are patent without stenosis. Right MCA branches appear stable and within normal limits. Venous sinuses: Early contrast timing, grossly patent. Anatomic variants: Dominant left vertebral artery. Fetal type right PCA origin. Review of the MIP images confirms the above findings IMPRESSION: 1. Negative for emergent large vessel occlusion and no infarct core or ischemic penumbra detected by CT Perfusion. 2. 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 3. Recanalized and or improved Left MCA and distal Left ACA branches since August. 4. Stable atherosclerosis notable for: - high-grade proximal Left ICA stenosis estimated at 70%, due to bulky calcified plaque. - up to moderate bilateral vertebral artery stenosis (dominant left vertebral V4 segment and non dominant right vertebral artery origin). - mild to moderate supraclinoid Left ICA siphon stenosis due to calcified plaque. 5. Aortic Atherosclerosis (ICD10-I70.0). Emphysema (ICD10-J43.9). Salient findings discussed by telephone with Dr. Caryl Pina on 01/22/2021 at 07:36 . Electronically Signed   By: Odessa Fleming M.D.   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

## 2021-01-30 ENCOUNTER — Non-Acute Institutional Stay (SKILLED_NURSING_FACILITY): Payer: Medicare Other | Admitting: Orthopedic Surgery

## 2021-01-30 ENCOUNTER — Encounter: Payer: Self-pay | Admitting: Orthopedic Surgery

## 2021-01-30 DIAGNOSIS — I69359 Hemiplegia and hemiparesis following cerebral infarction affecting unspecified side: Secondary | ICD-10-CM

## 2021-01-30 DIAGNOSIS — I480 Paroxysmal atrial fibrillation: Secondary | ICD-10-CM | POA: Diagnosis not present

## 2021-01-30 DIAGNOSIS — J690 Pneumonitis due to inhalation of food and vomit: Secondary | ICD-10-CM | POA: Diagnosis not present

## 2021-01-30 DIAGNOSIS — R293 Abnormal posture: Secondary | ICD-10-CM | POA: Diagnosis not present

## 2021-01-30 DIAGNOSIS — I10 Essential (primary) hypertension: Secondary | ICD-10-CM

## 2021-01-30 DIAGNOSIS — I69390 Apraxia following cerebral infarction: Secondary | ICD-10-CM | POA: Diagnosis not present

## 2021-01-30 DIAGNOSIS — I69391 Dysphagia following cerebral infarction: Secondary | ICD-10-CM | POA: Diagnosis not present

## 2021-01-30 DIAGNOSIS — E782 Mixed hyperlipidemia: Secondary | ICD-10-CM

## 2021-01-30 DIAGNOSIS — F339 Major depressive disorder, recurrent, unspecified: Secondary | ICD-10-CM

## 2021-01-30 DIAGNOSIS — I63312 Cerebral infarction due to thrombosis of left middle cerebral artery: Secondary | ICD-10-CM | POA: Diagnosis not present

## 2021-01-30 DIAGNOSIS — I69320 Aphasia following cerebral infarction: Secondary | ICD-10-CM | POA: Diagnosis not present

## 2021-01-30 DIAGNOSIS — K219 Gastro-esophageal reflux disease without esophagitis: Secondary | ICD-10-CM | POA: Diagnosis not present

## 2021-01-30 DIAGNOSIS — I69351 Hemiplegia and hemiparesis following cerebral infarction affecting right dominant side: Secondary | ICD-10-CM | POA: Diagnosis not present

## 2021-01-30 DIAGNOSIS — E119 Type 2 diabetes mellitus without complications: Secondary | ICD-10-CM | POA: Diagnosis not present

## 2021-01-30 DIAGNOSIS — I6602 Occlusion and stenosis of left middle cerebral artery: Secondary | ICD-10-CM | POA: Diagnosis not present

## 2021-01-30 DIAGNOSIS — R278 Other lack of coordination: Secondary | ICD-10-CM | POA: Diagnosis not present

## 2021-01-30 DIAGNOSIS — M6389 Disorders of muscle in diseases classified elsewhere, multiple sites: Secondary | ICD-10-CM | POA: Diagnosis not present

## 2021-01-30 LAB — BASIC METABOLIC PANEL
BUN: 13 (ref 4–21)
BUN: 13 (ref 4–21)
CO2: 31 — AB (ref 13–22)
Chloride: 99 (ref 99–108)
Creatinine: 0.7 (ref 0.5–1.1)
Creatinine: 0.7 (ref 0.5–1.1)
Glucose: 84
Potassium: 4 (ref 3.4–5.3)
Sodium: 143 (ref 137–147)

## 2021-01-30 LAB — CBC AND DIFFERENTIAL
HCT: 48 — AB (ref 36–46)
Hemoglobin: 15.1 (ref 12.0–16.0)
Platelets: 261 (ref 150–399)
WBC: 9.3

## 2021-01-30 LAB — COMPREHENSIVE METABOLIC PANEL
Calcium: 9.6 (ref 8.7–10.7)
Calcium: 9.6 (ref 8.7–10.7)

## 2021-01-30 LAB — CBC: RBC: 4.98 (ref 3.87–5.11)

## 2021-01-30 MED ORDER — SACCHAROMYCES BOULARDII 250 MG PO CAPS: 250.0000 mg | ORAL_CAPSULE | Freq: Two times a day (BID) | ORAL | 0 refills | Status: AC

## 2021-01-30 MED ORDER — DOXYCYCLINE HYCLATE 100 MG PO TABS: 100.0000 mg | ORAL_TABLET | Freq: Two times a day (BID) | ORAL | 0 refills | Status: AC

## 2021-01-30 NOTE — Progress Notes (Signed)
Location:  [facility] Room Number: 116-A Place of Service:  SNF (31) Provider:  Octavia Heir, NP   Patient Care Team: [name], MD as PCP - General (Internal Medicine) [name], MD as PCP - Cardiology (Cardiology) Duke Salvia, MD as PCP - Electrophysiology (Cardiology) Shirlean Kelly, MD as Consulting Physician (Neurosurgery)  Extended Emergency Contact Information Primary Emergency Contact: Dugdale,[name] Address: [address]          [place] [phone] [name] Home Phone: [phone] Mobile Phone: [phone] Relation: Spouse Secondary Emergency Contact: Caine,[name] Address: [address]          [place], [place] [address] [name] Mobile Phone: [phone] Relation: Son  Code Status:  Full Code  Goals of care: Advanced Directive information Advanced Directives 01/30/2021  Does Patient Have a Medical Advance Directive? Yes  Type of Advance Directive Living will  Does patient want to make changes to medical advance directive? No - Patient declined  Copy of Healthcare Power of Attorney in Chart? -  Would patient like information on creating a medical advance directive? -     Chief Complaint  Patient presents with   Acute Visit    Aspiration pneumonia     HPI:  Pt is a 75 y.o. female seen today for an acute visit for increased cough and shortness of breath.   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 Second hospitalization from 08/30- 09/08 due to pneumonia/sepsis.   She currently resides on the skilled nursing unit at [facility]. Husband present during encounter today.   10/17 she was seen by Dr. Chales Abrahams and noted have increased dyspnea when working with PT. Husband reports increased sob and now requiring oxygen. Today she is on room air. She continues to have intermittent cough. Denies sputum production. She has a PEG tube due  to dysphagia. She continues to work with ST. She remains on a DYS2 diet and nectar liquids. Afebrile, vitals stable.   10/17 CXR revealed pulmonary infiltrate in right middle and lower lung field, mild right pleural effusion present.   HTN- recent elevated blood pressures bring treated with hydralazine prn, Norvasc started 10/17 Flaccid hemiplegia- right sided weakness, aphasia present, continues to work with PT/OT Afib- remains on lopressor for rate control, Eliquis for clot prevention GERD- remains on Protonix Depression- remains on Wellbutrin HLD- LDL 120 11/13/2020, allergic to statins, remains on Zetia  Past Medical History:  Diagnosis Date   Anxiety    Arthritis    "some in my lower back; probably elbows, knees" (11/18/2017)   Atrial fibrillation (HCC)    Bell's palsy    when pt. was 75 yrs old, when under stress the left side of face will droop.   Complication of anesthesia    "vascular OR 2016; BP bottomed out; couldn't get it regulated; ended up in ICU for DAYS" (11/18/2017)   GERD (gastroesophageal reflux disease)    History of kidney stones    Hypertension    Hypertrophic cardiomyopathy (HCC)    severe LV basilar hypertrophy witn no evidence of significant outflow tract obstruction, EF 65-70%, mild LAE, mild TR, grade 1a diastolic dysfunction 05/15/10 (Dr. Donato Schultz) (Atrial Septal Hypertrophy pattern)-- Intra-op TEE with dsignificant outflow tract obstruction - AI, MR & TR   Insomnia    Mild aortic sclerosis    Osteopenia    Peripheral vascular disease (HCC)    Syncope    ,  Vagal   Past Surgical History:  Procedure Laterality Date   AUGMENTATION MAMMAPLASTY Bilateral    BACK SURGERY     CARDIAC CATHETERIZATION N/A 05/07/2016   Procedure: Left Heart Cath and Coronary Angiography;  Surgeon: Kathleene Hazel, MD;  Location: [facility] INVASIVE CV LAB;  Service: Cardiovascular;  Laterality: N/A;   CARDIOVERSION N/A 09/24/2017   Procedure: CARDIOVERSION;  Surgeon: Laurey Morale, MD;  Location: [facility] ENDOSCOPY;  Service: Cardiovascular;  Laterality: N/A;   DILATION AND CURETTAGE OF UTERUS     ENDARTERECTOMY FEMORAL Right 03/02/2015   Procedure: ENDARTERECTOMY RIGHT FEMORAL;  Surgeon: Chuck Hint, MD;  Location: [facility] OR;  Service: Vascular;  Laterality: Right;   ESOPHAGOGASTRODUODENOSCOPY (EGD) WITH PROPOFOL N/A 12/01/2020   Procedure: ESOPHAGOGASTRODUODENOSCOPY (EGD) WITH PROPOFOL;  Surgeon: Diamantina Monks, MD;  Location: [facility] ENDOSCOPY;  Service: General;  Laterality: N/A;   FACIAL COSMETIC SURGERY Left 2002   "related to Bell's Palsy @ age 70; left eye/side of face droopy; tried to make area symmetrical"   FEMORAL-POPLITEAL BYPASS GRAFT Right 03/02/2015   Procedure: BYPASS GRAFT FEMORAL-BELOW KNEE POPLITEAL ARTERY;  Surgeon: Chuck Hint, MD;  Location: [facility] OR;  Service: Vascular;  Laterality: Right;   INGUINAL HERNIA REPAIR Bilateral 2002   IR ANGIO INTRA EXTRACRAN SEL COM CAROTID INNOMINATE UNI L MOD SED  11/16/2020   IR CT HEAD LTD  11/13/2020   IR PERCUTANEOUS ART THROMBECTOMY/INFUSION INTRACRANIAL INC DIAG ANGIO  11/13/2020   OVARIAN CYST REMOVAL Left    PEG PLACEMENT N/A 12/01/2020   Procedure: PERCUTANEOUS ENDOSCOPIC GASTROSTOMY (PEG) PLACEMENT;  Surgeon: Diamantina Monks, MD;  Location: [facility] ENDOSCOPY;  Service: General;  Laterality: N/A;   PERIPHERAL VASCULAR CATHETERIZATION N/A 01/16/2015   Procedure: Abdominal Aortogram;  Surgeon: Chuck Hint, MD;  Location: [facility] INVASIVE CV LAB;  Service: Cardiovascular;  Laterality: N/A;   POSTERIOR LUMBAR FUSION  2015   "have plates and screws in there"   RADIOLOGY WITH ANESTHESIA N/A 11/13/2020   Procedure: IR WITH ANESTHESIA;  Surgeon: Radiologist, Medication, MD;  Location: [facility] OR;  Service: Radiology;  Laterality: N/A;   TONSILLECTOMY      Allergies  Allergen Reactions   Amoxicillin Other (See Comments)    UTI Has patient had a PCN reaction causing immediate rash, facial/tongue/throat  swelling, SOB or lightheadedness with hypotension: No Has patient had a PCN reaction causing severe rash involving mucus membranes or skin necrosis: No Has patient had a PCN reaction that required hospitalization: No Has patient had a PCN reaction occurring within the last 10 years: Yes--UTI ONLY If all of the above answers are "NO", then may proceed with Cephalosporin use.    Atenolol Cough   Crestor [Rosuvastatin Calcium] Other (See Comments)    Muscle aches   Pravastatin Other (See Comments)    Muscle aches   Sulfa Antibiotics Nausea Only   Codeine     hallucinations    Outpatient Encounter Medications as of 01/30/2021  Medication Sig   acetaminophen (TYLENOL) 325 MG tablet Take 650 mg by mouth every 4 (four) hours as needed for mild pain.   amiodarone (PACERONE) 200 MG tablet Place 1 tablet (200 mg total) into feeding tube daily.   amLODipine (NORVASC) 2.5 MG tablet Take 2.5 mg by mouth daily. In the morning 8-11 am   apixaban (ELIQUIS) 5 MG TABS tablet Place 1 tablet (5 mg total) into feeding tube 2 (two) times daily.   budesonide (PULMICORT) 0.5 MG/2ML nebulizer solution Take 2 mLs (0.5 mg total)  by nebulization 2 (two) times daily.   buPROPion (WELLBUTRIN) 75 MG tablet Place 1 tablet (75 mg total) into feeding tube 2 (two) times daily.   ezetimibe (ZETIA) 10 MG tablet Place 1 tablet (10 mg total) into feeding tube daily.   hydrALAZINE (APRESOLINE) 10 MG tablet Take 10 mg by mouth 3 (three) times daily as needed. SBP>165 or DBP >95   ipratropium-albuterol (DUONEB) 0.5-2.5 (3) MG/3ML SOLN Take 3 mLs by nebulization every 4 (four) hours as needed.   metoprolol tartrate (LOPRESSOR) 50 MG tablet Place 1 tablet (50 mg total) into feeding tube 2 (two) times daily.   pantoprazole sodium (PROTONIX) 40 mg/20 mL SUSP Place 40 mg into feeding tube 2 (two) times daily.   Water For Irrigation, Sterile (FREE WATER) SOLN Place 250 mLs into feeding tube 6 (six) times daily.   No  facility-administered encounter medications on file as of 01/30/2021.    Review of Systems  Constitutional:  Positive for fatigue. Negative for activity change, appetite change, chills and fever.  HENT:  Positive for trouble swallowing. Negative for congestion and rhinorrhea.   Respiratory:  Positive for cough, shortness of breath and wheezing.   Cardiovascular:  Negative for chest pain and leg swelling.  Gastrointestinal:  Negative for abdominal distention, abdominal pain, blood in stool, constipation, diarrhea, nausea and vomiting.  Genitourinary:  Negative for dysuria and hematuria.  Musculoskeletal:  Positive for gait problem. Negative for arthralgias and myalgias.  Skin: Negative.   Neurological:  Positive for speech difficulty and weakness. Negative for dizziness and headaches.       Right sided weakness  Psychiatric/Behavioral:  Positive for dysphoric mood. Negative for agitation and sleep disturbance. The patient is not nervous/anxious.    Immunization History  Administered Date(s) Administered   Influenza Split 01/16/2009, 04/30/2010, 01/26/2016, 02/07/2018, 02/01/2020   Influenza, High Dose Seasonal PF 02/07/2018, 01/25/2019   Influenza,inj,Quad PF,6+ Mos 03/06/2012, 01/26/2016, 01/10/2017   Influenza,inj,quad, With Preservative 02/14/2015   PFIZER(Purple Top)SARS-COV-2 Vaccination 05/06/2019, 05/27/2019, 01/27/2020, 09/18/2020   Pneumococcal Conjugate-13 03/19/2017   Pneumococcal Polysaccharide-23 02/14/2015   Td 02/25/2005   Tdap 05/19/2013   Zoster, Live 07/28/2006   Pertinent  Health Maintenance Due  Topic Date Due   COLONOSCOPY (Pts 45-68yrs Insurance coverage will need to be confirmed)  Never done   DEXA SCAN  Never done   INFLUENZA VACCINE  11/13/2020   Fall Risk  11/02/2018 09/03/2018  Falls in the past year? 0 1  Number falls in past yr: 0 0  Injury with Fall? 0 1  Risk for fall due to : - History of fall(s)  Follow up - Falls evaluation completed    Functional Status Survey:    Vitals:   01/30/21 1024  BP: (!) 156/88  Pulse: 71  Resp: 20  Temp: (!) 97.5 F (36.4 C)  SpO2: 100%  Weight: 101 lb 9.6 oz (46.1 kg)  Height:  (1.6 m)   Body mass index is 18 kg/m. Physical Exam Vitals reviewed.  Constitutional:      General: She is not in acute distress. HENT:     Head: Normocephalic.  Eyes:     General:        Right eye: No discharge.        Left eye: No discharge.  Neck:     Vascular: No carotid bruit.  Cardiovascular:     Rate and Rhythm: Normal rate. Rhythm irregular.     Pulses: Normal pulses.     Heart sounds: Normal heart  sounds. No murmur heard. Pulmonary:     Effort: Pulmonary effort is normal.     Breath sounds: Decreased air movement present. Examination of the right-middle field reveals decreased breath sounds. Examination of the left-middle field reveals rales. Examination of the left-lower field reveals rales. Decreased breath sounds and rales present.     Comments: Remains on room air today Abdominal:     General: Bowel sounds are normal. There is no distension.     Palpations: Abdomen is soft.     Tenderness: There is no abdominal tenderness.     Comments: PEG tube   Musculoskeletal:     Cervical back: Normal range of motion.     Right lower leg: No edema.     Left lower leg: No edema.  Lymphadenopathy:     Cervical: No cervical adenopathy.  Skin:    General: Skin is warm and dry.     Capillary Refill: Capillary refill takes less than 2 seconds.  Neurological:     General: No focal deficit present.     Mental Status: She is alert. Mental status is at baseline.     Motor: Weakness present.     Gait: Gait abnormal.     Comments: Right sided facial droop, right sided weakness, aphasia  Psychiatric:        Mood and Affect: Mood normal. Affect is flat.        Behavior: Behavior normal.    Labs reviewed: Recent Labs    11/29/20 0128 11/30/20 0826 12/02/20 0216 12/04/20 0052  01/15/21 0603 01/22/21 4967 01/22/21 0658 01/23/21 0259  NA 151*   < > 140   < > 142 139 138 135  K 4.4   < > 4.2   < > 3.6 4.3 3.9 3.6  CL 113*   < > 108   < > 104 98 98 94*  CO2 30   < > 25   < > 29 33*  --  28  GLUCOSE 140*   < > 181*   < > 93 104* 106* 85  BUN 60*   < > 51*   < > 26* 16 19 14   CREATININE 0.75   < > 0.79   < > 0.82 0.98 0.90 0.85  CALCIUM 8.9   < > 8.4*   < > 9.1 9.0  --  8.7*  MG 2.3  --  2.2  --   --   --   --  2.0  PHOS 3.4  --  3.0  --   --   --   --  4.1   < > = values in this interval not displayed.   Recent Labs    12/22/20 0409 01/22/21 0651 01/23/21 0259  AST 35 39 28  ALT 47* 38 34  ALKPHOS 69 68 57  BILITOT 0.6 1.3* 1.5*  PROT 5.6* 6.1* 5.6*  ALBUMIN 2.6* 3.4* 3.1*   Recent Labs    01/08/21 0810 01/15/21 0603 01/22/21 0651 01/22/21 0658 01/23/21 0259  WBC 7.8 8.8 10.7*  --  8.9  NEUTROABS 4.8 5.5 7.7  --   --   HGB 16.6* 15.3* 15.5* 16.7* 14.5  HCT 51.6* 47.5* 48.4* 49.0* 45.8  MCV 95.9 95.2 96.4  --  95.0  PLT 283 234 237  --  195   Lab Results  Component Value Date   TSH 1.087 11/18/2020   Lab Results  Component Value Date   HGBA1C 6.0 (H) 11/13/2020   Lab Results  Component Value Date   CHOL 209 (H) 11/13/2020   HDL 70 11/13/2020   LDLCALC 120 (H) 11/13/2020   TRIG 97 11/13/2020   CHOLHDL 3.0 11/13/2020    Significant Diagnostic Results in last 30 days:  MR BRAIN WO CONTRAST  Result Date: 01/22/2021 CLINICAL DATA:  Transient ischemic attack EXAM: MRI HEAD WITHOUT CONTRAST TECHNIQUE: Multiplanar, multiecho pulse sequences of the brain and surrounding structures were obtained without intravenous contrast. COMPARISON:  Same day CT/CTA head and neck, MR head 11/14/2020 FINDINGS: Brain: There is diffusion restriction with associated T2/FLAIR signal abnormality in the left ACA and MCA distribution, some of which was present on the prior study from 11/14/2020 and has decreased in conspicuity; however, there is increased  diffusion restriction in the left basal ganglia, internal and external capsules, extending to the caudate body/corona radiata. Findings suspicious for acute infarct superimposed on evolving subacute infarcts. Curvilinear SWI signal dropout along the left precentral sulcus may reflect petechial hemorrhage. There is an additional focus of SWI signal dropout in the left superior frontal gyrus which may reflect additional petechial hemorrhage versus punctate chronic microhemorrhage. Vascular: The right ICA flow void is absent through the communicating segment, in keeping with the findings on the prior CTA neck. Skull and upper cervical spine: Normal marrow signal. Sinuses/Orbits: There is mild mucosal thickening in the paranasal sinuses. The globes and orbits are unremarkable. Other: None. IMPRESSION: 1. New/increased diffusion restriction in the left basal ganglia, internal and external capsules, extending to the caudate body/corona radiata compared to the study from 11/14/2020 consistent with acute to subacute infarct. 2. Additional diffusion restriction in the left MCA and ACA territories consistent with evolving subacute infarct as seen on the study from 11/14/2020. 3. Curvilinear SWI signal dropout along the left precentral sulcus consistent with petechial hemorrhage. There is an additional focus of petechial hemorrhage versus punctate chronic microhemorrhage in the left superior frontal gyrus. Electronically Signed   By: Lesia Hausen M.D.   On: 01/22/2021 10:20   ECHOCARDIOGRAM COMPLETE  Result Date: 01/23/2021    ECHOCARDIOGRAM REPORT   Patient Name:   Beverly Li Date of Exam: 01/23/2021 Medical Rec #:  [id number]       Height:       63.0 in Accession #:    [id number]      Weight:       99.1 lb Date of Birth:  Sep 09, 1945       BSA:          1.435 m Patient Age:    75 years        BP:           158/86 mmHg Patient Gender: F               HR:           80 bpm. Exam Location:  Inpatient Procedure: 2D Echo,  Cardiac Doppler and Color Doppler Indications:    Abnormal ECG R94.31  History:        Patient has prior history of Echocardiogram examinations, most                 recent 11/14/2020. CHF, PAD, Arrythmias:Atrial Fibrillation; Risk                 Factors:Sleep Apnea and Hypertension. Hypertrophic                 cardiomyopathy.  Sonographer:    Leta Jungling RDCS Referring Phys: [phone] LINDSAY B ROBERTS IMPRESSIONS  1. Left ventricular ejection fraction, by estimation, is 60%. The left ventricle has normal function. The left ventricle has no regional wall motion abnormalities. There is severe left ventricular hypertrophy. Peak gradient of 2 mm Hg; there is no evidence of LVOT Obstruction in this study. No evidence of apical aneursysm. Left ventricular diastolic parameters are indeterminate.  2. Right ventricular systolic function is mildly reduced. The right ventricular size is normal. There is normal pulmonary artery systolic pressure.  3. Left atrial size was mildly dilated.  4. Right atrial size was moderately dilated.  5. The mitral valve is degenerative. Mild mitral valve regurgitation. The mean mitral valve gradient is 4.0 mmHg with average heart rate of 72 bpm.  6. The aortic valve is tricuspid. There is moderate calcification of the aortic valve. Aortic valve regurgitation is mild. Mild to moderate aortic valve sclerosis/calcification is present, without any evidence of aortic stenosis.  7. Aortic dilatation noted. There is mild dilatation of the ascending aorta, measuring 40 mm. Comparison(s): A prior study was performed on 11/14/2020. No significant change from prior study. FINDINGS  Left Ventricle: Left ventricular ejection fraction, by estimation, is 60%. The left ventricle has normal function. The left ventricle has no regional wall motion abnormalities. The left ventricular internal cavity size was small. There is severe left ventricular hypertrophy. Left ventricular diastolic parameters are  indeterminate. Right Ventricle: The right ventricular size is normal. Right vetricular wall thickness was not well visualized. Right ventricular systolic function is mildly reduced. There is normal pulmonary artery systolic pressure. The tricuspid regurgitant velocity is 2.62 m/s, and with an assumed right atrial pressure of 8 mmHg, the estimated right ventricular systolic pressure is 35.5 mmHg. Left Atrium: Left atrial size was mildly dilated. Right Atrium: Right atrial size was moderately dilated. Pericardium: There is no evidence of pericardial effusion. Mitral Valve: The mitral valve is degenerative in appearance. Mild mitral valve regurgitation. MV peak gradient, 7.9 mmHg. The mean mitral valve gradient is 4.0 mmHg with average heart rate of 72 bpm. Tricuspid Valve: The tricuspid valve is grossly normal. Tricuspid valve regurgitation is mild . No evidence of tricuspid stenosis. Aortic Valve: The aortic valve is tricuspid. There is moderate calcification of the aortic valve. Aortic valve regurgitation is mild. Aortic regurgitation PHT measures 1128 msec. Mild to moderate aortic valve sclerosis/calcification is present, without any evidence of aortic stenosis. Pulmonic Valve: The pulmonic valve was not well visualized. Pulmonic valve regurgitation is mild to moderate. No evidence of pulmonic stenosis. Aorta: Aortic dilatation noted. There is mild dilatation of the ascending aorta, measuring 40 mm. IAS/Shunts: The atrial septum is grossly normal.  LEFT VENTRICLE PLAX 2D LVIDd:         3.30 cm     Diastology LVIDs:         2.30 cm     LV e' medial:    3.53 cm/s LV PW:         1.30 cm     LV E/e' medial:  27.3 LV IVS:        2.00 cm     LV e' lateral:   5.89 cm/s LVOT diam:     2.00 cm     LV E/e' lateral: 16.4 LV SV:         35 LV SV Index:   25 LVOT Area:     3.14 cm  LV Volumes (MOD) LV vol d, MOD A2C: 69.1 ml LV vol d, MOD A4C: 81.8 ml LV vol s, MOD A2C: 28.1  ml LV vol s, MOD A4C: 35.3 ml LV SV MOD A2C:      41.0 ml LV SV MOD A4C:     81.8 ml LV SV MOD BP:      43.1 ml RIGHT VENTRICLE RV S prime:     8.93 cm/s TAPSE (M-mode): 1.1 cm LEFT ATRIUM             Index        RIGHT ATRIUM           Index LA diam:        4.20 cm 2.93 cm/m   RA Area:     22.10 cm LA Vol (A2C):   43.5 ml 30.32 ml/m  RA Volume:   63.60 ml  44.33 ml/m LA Vol (A4C):   56.0 ml 39.03 ml/m LA Biplane Vol: 53.5 ml 37.29 ml/m  AORTIC VALVE LVOT Vmax:   76.00 cm/s LVOT Vmean:  52.500 cm/s LVOT VTI:    0.112 m AI PHT:      1128 msec  AORTA Ao Root diam: 3.40 cm Ao Asc diam:  3.95 cm MITRAL VALVE               TRICUSPID VALVE MV Area (PHT): 4.65 cm    TR Peak grad:   27.5 mmHg MV Area VTI:   1.52 cm    TR Vmax:        262.00 cm/s MV Peak grad:  7.9 mmHg MV Mean grad:  4.0 mmHg    SHUNTS MV Vmax:       1.40 m/s    Systemic VTI:  0.11 m MV Vmean:      88.9 cm/s   Systemic Diam: 2.00 cm MV Decel Time: 163 msec MV E velocity: 96.43 cm/s Riley Lam MD Electronically signed by Riley Lam MD Signature Date/Time: 01/23/2021/10:36:35 AM    Final    CT HEAD CODE STROKE WO CONTRAST  Result Date: 01/22/2021 CLINICAL DATA:  Code stroke. 75 year old female with neurologic deficit. Left ACA and MCA territory infarcts in August. EXAM: CT HEAD WITHOUT CONTRAST TECHNIQUE: Contiguous axial images were obtained from the base of the skull through the vertex without intravenous contrast. COMPARISON:  Brain MRI 11/14/2020, head CT 11/13/2020. FINDINGS: Brain: Hypodensity in the left MCA and ACA territories corresponding to restricted diffusion last month, resembling developing encephalomalacia. No new areas of cytotoxic edema identified. Heterogeneity in the right deep gray matter nuclei and cerebellum compatible with small chronic infarcts appears stable. Additional patchy bilateral white matter hypodensity appears stable to FLAIR signal abnormality last month. No midline shift, ventriculomegaly, mass effect, evidence of mass lesion, intracranial  hemorrhage or evidence of cortically based acute infarction. Vascular: Calcified atherosclerosis at the skull base. No new hyperdense vessel identified. Skull: No acute osseous abnormality identified. Sinuses/Orbits: Visualized paranasal sinuses and mastoids are stable and well aerated. Other: Stable visible face, orbit, and scalp soft tissues. ASPECTS [facility] Stroke Program Early CT Score) Total score (0-10 with 10 being normal): 10, when allowing for evolution of left MCA and ACA infarcts since August. IMPRESSION: 1. Expected evolution of the left MCA and ACA territory infarcts since last month. Underlying chronic small vessel disease. 2. No new cortically based infarct or acute intracranial hemorrhage identified. ASPECTS 10. 3. These results were communicated to Drs. Lindzen and Collins at [employer] am on 01/22/2021 by text page via the [employer] system. Electronically Signed   By: Odessa Fleming M.D.   On: 01/22/2021 07:13   CT ANGIO HEAD  NECK W WO CM W PERF (CODE STROKE)  Result Date: 01/22/2021 CLINICAL DATA:  75 year old female code stroke. Status post left M1 occlusion, left ACA stenosis, ACA and MCA infarcts in August. EXAM: CT ANGIOGRAPHY HEAD AND NECK CT PERFUSION BRAIN TECHNIQUE: Multidetector CT imaging of the head and neck was performed using the standard protocol during bolus administration of intravenous contrast. Multiplanar CT image reconstructions and MIPs were obtained to evaluate the vascular anatomy. Carotid stenosis measurements (when applicable) are obtained utilizing NASCET criteria, using the distal internal carotid diameter as the denominator. Multiphase CT imaging of the brain was performed following IV bolus contrast injection. Subsequent parametric perfusion maps were calculated using RAPID software. CONTRAST:  100 mL Omnipaque 350 COMPARISON:  Plain head CT 0659 hours today.  Brain MRI 11/14/2020. CTA head and neck 11/13/2020. FINDINGS: CT Brain Perfusion Findings: ASPECTS: 10 (of all  vein left MCA and ACA infarcts since August). CBF (<30%) Volume: 0 Perfusion (Tmax>6.0s) volume: 0 Mismatch Volume: Not applicable Infarction Location:Not applicable CTA NECK Skeleton: Stable.  No acute osseous abnormality identified. Upper chest: Stable upper lung septal thickening with mild to moderate centrilobular emphysema. No superior mediastinal lymphadenopathy identified. Other neck: Stable. Aortic arch: Calcified aortic atherosclerosis. Three vessel arch configuration better demonstrated on the August CTA. Right carotid system: Visible brachiocephalic artery and right CCA origin are patent with plaque but no significant stenosis. 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 As before, no right ICA reconstitution to the skull base. Left carotid system: Stable left CCA plaque without stenosis. Bulky calcified plaque at the left ICA origin and bulb is stable with stenosis numerically estimated at 70 % with respect to the distal vessel. The left ICA remains patent to the skull base without additional stenosis. Vertebral arteries: Stable proximal right subclavian and right vertebral artery plaque with moderate stenosis of the non dominant right vertebral artery origin. The right vertebral remains patent to the skull base, diminutive, but with no additional stenosis. Stable proximal left subclavian artery plaque without stenosis. Mild calcified plaque at the left vertebral artery origin without stenosis. Dominant left vertebral with tortuous V1 segment. The left vertebral remains patent to the skull base without stenosis. CTA HEAD Posterior circulation: Non dominant right vertebral artery continues to the vertebrobasilar junction without stenosis. Right AICA appears dominant and patent. Dominant left vertebral V4 segment calcified plaque with mild to moderate stenosis appears stable (series 7, image 2 95). Stenosis is proximal to the normal left  PICA origin and vertebrobasilar junction. Patent basilar artery with mild irregularity but no stenosis. Patent SCA and left PCA origins. Fetal type right PCA origin but with patent communication to the right SCA as before. Left posterior communicating is diminutive or absent. Bilateral PCA branches are stable and within normal limits. Anterior circulation: Right ICA terminus reconstituted from the posterior communicating artery as before. No right ICA siphon enhancement. Right MCA and ACA origins remain patent and within normal limits. Left ICA siphon calcified plaque with mild to moderate supraclinoid stenosis is stable. Patent left MCA and ACA origins. Normal anterior communicating artery. ACA A2 segments are patent without stenosis. Improved appearance of distal left ACA branches since August. No discrete branch occlusion now. Left MCA M1 segment has recanalized and is patent to the trifurcation without stenosis. Some left MCA branches are attenuated concordant with the recent infarction. No M2 branch occlusion is evident. Right MCA M1 segment and bifurcation are patent without stenosis.  Right MCA branches appear stable and within normal limits. Venous sinuses: Early contrast timing, grossly patent. Anatomic variants: Dominant left vertebral artery. Fetal type right PCA origin. Review of the MIP images confirms the above findings IMPRESSION: 1. Negative for emergent large vessel occlusion and no infarct core or ischemic penumbra detected by CT Perfusion. 2. 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 3. Recanalized and or improved Left MCA and distal Left ACA branches since August. 4. Stable atherosclerosis notable for: - high-grade proximal Left ICA stenosis estimated at 70%, due to bulky calcified plaque. - up to moderate bilateral vertebral artery stenosis (dominant left vertebral V4 segment and non dominant right  vertebral artery origin). - mild to moderate supraclinoid Left ICA siphon stenosis due to calcified plaque. 5. Aortic Atherosclerosis (ICD10-I70.0). Emphysema (ICD10-J43.9). Salient findings discussed by telephone with Dr. Caryl Pina on 01/22/2021 at 07:36 . Electronically Signed   By: Odessa Fleming M.D.   On: 01/22/2021 07:41    Assessment/Plan 1. Aspiration pneumonia of right middle lobe, unspecified aspiration pneumonia type (HCC) - intermittent use of oxygen, afebrile - increased dyspnea with PT - CXR 10/17 revealed pulmonary infiltrate in right middle and lower lung field, mild right pleural effusion present.  - doxycycline (VIBRA-TABS) 100 MG tablet; Take 1 tablet (100 mg total) by mouth 2 (two) times daily for 7 days.  Dispense: 14 tablet; Refill: 0 - saccharomyces boulardii (FLORASTOR) 250 MG capsule; Take 1 capsule (250 mg total) by mouth 2 (two) times daily for 7 days.  Dispense: 14 capsule; Refill: 0  2. 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 Dysphagia, post-stroke - Peg tube placed during hospitalization - remains on DYS2 diet with nectar thick fluids - cont ST  4. Essential hypertension - uncontrolled - cont hydralazine tid prn for SBP > 165 - amLODipine (NORVASC) 2.5 MG tablet; Take 2.5 mg by mouth daily. In the morning 8-11 am  5. Paroxysmal atrial fibrillation (HCC) - rate controlled with amiodarone - cont Eliquis for clot prevention  6. Gastroesophageal reflux disease, unspecified whether esophagitis present - cont Protonix  7. Depression, recurrent (HCC) - cont Wellbutrin  8. Mixed hyperlipidemia - LDL 120 11/2020 - statin allergy - cont zetia    Family/ staff Communication: plan discussed with patient, husband and nurse  Labs/tests ordered:  none

## 2021-01-31 DIAGNOSIS — I63312 Cerebral infarction due to thrombosis of left middle cerebral artery: Secondary | ICD-10-CM | POA: Diagnosis not present

## 2021-01-31 DIAGNOSIS — M6389 Disorders of muscle in diseases classified elsewhere, multiple sites: Secondary | ICD-10-CM | POA: Diagnosis not present

## 2021-01-31 DIAGNOSIS — R2689 Other abnormalities of gait and mobility: Secondary | ICD-10-CM | POA: Diagnosis not present

## 2021-01-31 DIAGNOSIS — R278 Other lack of coordination: Secondary | ICD-10-CM | POA: Diagnosis not present

## 2021-01-31 DIAGNOSIS — M62561 Muscle wasting and atrophy, not elsewhere classified, right lower leg: Secondary | ICD-10-CM | POA: Diagnosis not present

## 2021-01-31 DIAGNOSIS — I69351 Hemiplegia and hemiparesis following cerebral infarction affecting right dominant side: Secondary | ICD-10-CM | POA: Diagnosis not present

## 2021-01-31 DIAGNOSIS — I69320 Aphasia following cerebral infarction: Secondary | ICD-10-CM | POA: Diagnosis not present

## 2021-01-31 DIAGNOSIS — I69391 Dysphagia following cerebral infarction: Secondary | ICD-10-CM | POA: Diagnosis not present

## 2021-01-31 DIAGNOSIS — I69390 Apraxia following cerebral infarction: Secondary | ICD-10-CM | POA: Diagnosis not present

## 2021-01-31 DIAGNOSIS — R293 Abnormal posture: Secondary | ICD-10-CM | POA: Diagnosis not present

## 2021-01-31 DIAGNOSIS — I6602 Occlusion and stenosis of left middle cerebral artery: Secondary | ICD-10-CM | POA: Diagnosis not present

## 2021-02-01 DIAGNOSIS — I6602 Occlusion and stenosis of left middle cerebral artery: Secondary | ICD-10-CM | POA: Diagnosis not present

## 2021-02-01 DIAGNOSIS — I69390 Apraxia following cerebral infarction: Secondary | ICD-10-CM | POA: Diagnosis not present

## 2021-02-01 DIAGNOSIS — I69320 Aphasia following cerebral infarction: Secondary | ICD-10-CM | POA: Diagnosis not present

## 2021-02-01 DIAGNOSIS — I69391 Dysphagia following cerebral infarction: Secondary | ICD-10-CM | POA: Diagnosis not present

## 2021-02-02 ENCOUNTER — Emergency Department (HOSPITAL_COMMUNITY)
Admission: EM | Admit: 2021-02-02 | Discharge: 2021-02-02 | Disposition: A | Discharge status: Home / Self Care | Payer: Medicare Other | Attending: Emergency Medicine | Admitting: Emergency Medicine

## 2021-02-02 ENCOUNTER — Emergency Department (HOSPITAL_COMMUNITY): Payer: Medicare Other

## 2021-02-02 DIAGNOSIS — R0789 Other chest pain: Secondary | ICD-10-CM | POA: Diagnosis not present

## 2021-02-02 DIAGNOSIS — Z79899 Other long term (current) drug therapy: Secondary | ICD-10-CM | POA: Diagnosis not present

## 2021-02-02 DIAGNOSIS — Z7401 Bed confinement status: Secondary | ICD-10-CM | POA: Diagnosis not present

## 2021-02-02 DIAGNOSIS — Z7951 Long term (current) use of inhaled steroids: Secondary | ICD-10-CM | POA: Insufficient documentation

## 2021-02-02 DIAGNOSIS — Z7901 Long term (current) use of anticoagulants: Secondary | ICD-10-CM | POA: Diagnosis not present

## 2021-02-02 DIAGNOSIS — J441 Chronic obstructive pulmonary disease with (acute) exacerbation: Secondary | ICD-10-CM

## 2021-02-02 DIAGNOSIS — G459 Transient cerebral ischemic attack, unspecified: Secondary | ICD-10-CM | POA: Diagnosis not present

## 2021-02-02 DIAGNOSIS — I5033 Acute on chronic diastolic (congestive) heart failure: Secondary | ICD-10-CM | POA: Insufficient documentation

## 2021-02-02 DIAGNOSIS — Z743 Need for continuous supervision: Secondary | ICD-10-CM | POA: Diagnosis not present

## 2021-02-02 DIAGNOSIS — I499 Cardiac arrhythmia, unspecified: Secondary | ICD-10-CM | POA: Diagnosis not present

## 2021-02-02 DIAGNOSIS — I4891 Unspecified atrial fibrillation: Secondary | ICD-10-CM | POA: Insufficient documentation

## 2021-02-02 DIAGNOSIS — Z87891 Personal history of nicotine dependence: Secondary | ICD-10-CM | POA: Insufficient documentation

## 2021-02-02 DIAGNOSIS — R0902 Hypoxemia: Secondary | ICD-10-CM | POA: Diagnosis not present

## 2021-02-02 DIAGNOSIS — Z20822 Contact with and (suspected) exposure to covid-19: Secondary | ICD-10-CM | POA: Diagnosis not present

## 2021-02-02 DIAGNOSIS — I110 Hypertensive heart disease with heart failure: Secondary | ICD-10-CM | POA: Diagnosis not present

## 2021-02-02 DIAGNOSIS — I2510 Atherosclerotic heart disease of native coronary artery without angina pectoris: Secondary | ICD-10-CM | POA: Insufficient documentation

## 2021-02-02 DIAGNOSIS — R6889 Other general symptoms and signs: Secondary | ICD-10-CM | POA: Diagnosis not present

## 2021-02-02 DIAGNOSIS — R079 Chest pain, unspecified: Secondary | ICD-10-CM | POA: Diagnosis not present

## 2021-02-02 DIAGNOSIS — R0602 Shortness of breath: Secondary | ICD-10-CM | POA: Diagnosis not present

## 2021-02-02 DIAGNOSIS — R0600 Dyspnea, unspecified: Secondary | ICD-10-CM

## 2021-02-02 DIAGNOSIS — J90 Pleural effusion, not elsewhere classified: Secondary | ICD-10-CM | POA: Diagnosis not present

## 2021-02-02 DIAGNOSIS — J9811 Atelectasis: Secondary | ICD-10-CM | POA: Diagnosis not present

## 2021-02-02 LAB — BASIC METABOLIC PANEL
Anion gap: 9 (ref 5–15)
BUN: 21 mg/dL (ref 8–23)
CO2: 30 mmol/L (ref 22–32)
Calcium: 8.9 mg/dL (ref 8.9–10.3)
Chloride: 100 mmol/L (ref 98–111)
Creatinine, Ser: 0.72 mg/dL (ref 0.44–1.00)
GFR, Estimated: >60 mL/min (ref >60)
Glucose, Bld: 114 mg/dL — ABNORMAL HIGH (ref 70–99)
Potassium: 3.5 mmol/L (ref 3.5–5.1)
Sodium: 139 mmol/L (ref 135–145)

## 2021-02-02 LAB — CBC WITH DIFFERENTIAL/PLATELET
Abs Immature Granulocytes: 0.04 10*3/uL (ref 0.00–0.07)
Basophils Absolute: 0.1 10*3/uL (ref 0.0–0.1)
Basophils Relative: 1 %
Eosinophils Absolute: 0.1 10*3/uL (ref 0.0–0.5)
Eosinophils Relative: 1 %
HCT: 45.3 % (ref 36.0–46.0)
Hemoglobin: 14.4 g/dL (ref 12.0–15.0)
Immature Granulocytes: 0 %
Lymphocytes Relative: 14 %
Lymphs Abs: 1.3 10*3/uL (ref 0.7–4.0)
MCH: 30.3 pg (ref 26.0–34.0)
MCHC: 31.8 g/dL (ref 30.0–36.0)
MCV: 95.4 fL (ref 80.0–100.0)
Monocytes Absolute: 0.6 10*3/uL (ref 0.1–1.0)
Monocytes Relative: 7 %
Neutro Abs: 7.2 10*3/uL (ref 1.7–7.7)
Neutrophils Relative %: 77 %
Platelets: 286 10*3/uL (ref 150–400)
RBC: 4.75 MIL/uL (ref 3.87–5.11)
RDW: 14.5 % (ref 11.5–15.5)
WBC: 9.3 10*3/uL (ref 4.0–10.5)
nRBC: 0 % (ref 0.0–0.2)

## 2021-02-02 LAB — RESP PANEL BY RT-PCR (FLU A&B, COVID) ARPGX2
Influenza A by PCR: NEGATIVE
Influenza B by PCR: NEGATIVE
SARS Coronavirus 2 by RT PCR: NEGATIVE

## 2021-02-02 LAB — BRAIN NATRIURETIC PEPTIDE: B Natriuretic Peptide: 1459.3 pg/mL — ABNORMAL HIGH (ref 0.0–100.0)

## 2021-02-02 LAB — TROPONIN I (HIGH SENSITIVITY)
Troponin I (High Sensitivity): 114 ng/L (ref <18)
Troponin I (High Sensitivity): 108 ng/L (ref <18)

## 2021-02-02 MED ORDER — PREDNISOLONE SODIUM PHOSPHATE 15 MG/5ML PO SOLN: 60.0000 mg | Freq: Once | ORAL | Status: DC

## 2021-02-02 MED ORDER — BUPROPION HCL 75 MG PO TABS
75.0000 mg | ORAL_TABLET | Freq: Two times a day (BID) | ORAL | Status: DC
  Administered 2021-02-02: 75 mg
  Filled 2021-02-02 (×2): qty 1

## 2021-02-02 MED ORDER — PREDNISONE 5 MG/ML PO CONC
60.0000 mg | Freq: Once | ORAL | Status: AC
  Administered 2021-02-02: 60 mg
  Filled 2021-02-02: qty 12

## 2021-02-02 MED ORDER — DOXYCYCLINE HYCLATE 100 MG PO TABS
100.0000 mg | ORAL_TABLET | Freq: Two times a day (BID) | ORAL | Status: DC
  Administered 2021-02-02: 100 mg
  Filled 2021-02-02: qty 1

## 2021-02-02 MED ORDER — METOPROLOL TARTRATE 25 MG PO TABS
50.0000 mg | ORAL_TABLET | Freq: Two times a day (BID) | ORAL | Status: DC
  Administered 2021-02-02: 50 mg
  Filled 2021-02-02: qty 2

## 2021-02-02 MED ORDER — AMLODIPINE BESYLATE 5 MG PO TABS
2.5000 mg | ORAL_TABLET | Freq: Every day | ORAL | Status: DC
  Administered 2021-02-02: 2.5 mg
  Filled 2021-02-02: qty 1

## 2021-02-02 MED ORDER — PREDNISONE 5 MG/5ML PO SOLN: 30.0000 mg | Freq: Every day | ORAL | 0 refills | Status: AC

## 2021-02-02 MED ORDER — HYDRALAZINE HCL 20 MG/ML IJ SOLN
5.0000 mg | Freq: Once | INTRAMUSCULAR | Status: AC
  Administered 2021-02-02: 5 mg via INTRAVENOUS
  Filled 2021-02-02: qty 1

## 2021-02-02 MED ORDER — LIDOCAINE VISCOUS HCL 2 % MT SOLN
15.0000 mL | Freq: Once | OROMUCOSAL | Status: AC
  Administered 2021-02-02: 15 mL via ORAL
  Filled 2021-02-02: qty 15

## 2021-02-02 MED ORDER — ALBUTEROL SULFATE (2.5 MG/3ML) 0.083% IN NEBU
5.0000 mg | INHALATION_SOLUTION | Freq: Once | RESPIRATORY_TRACT | Status: AC
  Administered 2021-02-02: 5 mg via RESPIRATORY_TRACT
  Filled 2021-02-02: qty 6

## 2021-02-02 MED ORDER — AMLODIPINE BESYLATE 5 MG PO TABS: 2.5000 mg | ORAL_TABLET | Freq: Every day | ORAL | Status: DC

## 2021-02-02 MED ORDER — DOXYCYCLINE HYCLATE 100 MG PO TABS: 100.0000 mg | ORAL_TABLET | Freq: Two times a day (BID) | ORAL | Status: DC

## 2021-02-02 MED ORDER — IPRATROPIUM BROMIDE 0.02 % IN SOLN
0.5000 mg | Freq: Once | RESPIRATORY_TRACT | Status: AC
  Administered 2021-02-02: 0.5 mg via RESPIRATORY_TRACT
  Filled 2021-02-02: qty 2.5

## 2021-02-02 MED ORDER — AMIODARONE HCL 200 MG PO TABS
200.0000 mg | ORAL_TABLET | Freq: Every day | ORAL | Status: DC
  Administered 2021-02-02: 200 mg
  Filled 2021-02-02: qty 1

## 2021-02-02 MED ORDER — APIXABAN 5 MG PO TABS
5.0000 mg | ORAL_TABLET | Freq: Two times a day (BID) | ORAL | Status: DC
  Administered 2021-02-02: 5 mg
  Filled 2021-02-02: qty 1

## 2021-02-02 MED ORDER — PANTOPRAZOLE 2 MG/ML SUSPENSION
40.0000 mg | Freq: Two times a day (BID) | ORAL | Status: DC
  Administered 2021-02-02: 40 mg
  Filled 2021-02-02 (×2): qty 20

## 2021-02-02 MED ORDER — ALUM & MAG HYDROXIDE-SIMETH 200-200-20 MG/5ML PO SUSP
30.0000 mL | Freq: Once | ORAL | Status: AC
  Administered 2021-02-02: 30 mL
  Filled 2021-02-02: qty 30

## 2021-02-02 NOTE — ED Notes (Signed)
Patient is 7th on the list for [facility]

## 2021-02-02 NOTE — ED Provider Notes (Signed)
[facility] EMERGENCY DEPARTMENT Provider Note   CSN: [id number] Arrival date & time: 02/02/21  0507     History Chief Complaint  Patient presents with   Chest Pain   Shortness of Breath       Level 5 caveat due to speech difficulty/aphasia  Beverly Li is a 75 y.o. female.  The history is provided by the patient, the EMS personnel and the nursing home. The history is limited by the condition of the patient.  Shortness of Breath Severity:  Moderate Onset quality:  Gradual Timing:  Constant Progression:  Unchanged Chronicity:  New Relieved by:  Oxygen Worsened by:  Nothing Associated symptoms: chest pain and cough   Patient with history of atrial fibrillation, previous left ACA/MCA stroke with history of right-sided hemiparesis presents with chest pain and shortness of breath. Patient currently resides in a skilled nursing facility.  It is reported patient was recently treated for pneumonia and started on doxycycline around the 19th.  Tonight patient was reporting increasing chest pain and shortness of breath.  Nursing staff at the facility increased her oxygen from 3L to 5 L due to hypoxia.  Patient given hydralazine by nursing staff due to hypertension.  She was also given Tylenol  She has also been given nitroglycerin.  Patient has difficulty communicating due to aphasia     Past Medical History:  Diagnosis Date   Anxiety    Arthritis    "some in my lower back; probably elbows, knees" (11/18/2017)   Atrial fibrillation (HCC)    Bell's palsy    when pt. was 75 yrs old, when under stress the left side of face will droop.   Complication of anesthesia    "vascular OR 2016; BP bottomed out; couldn't get it regulated; ended up in ICU for DAYS" (11/18/2017)   GERD (gastroesophageal reflux disease)    History of kidney stones    Hypertension    Hypertrophic cardiomyopathy (HCC)    severe LV basilar hypertrophy witn no evidence of significant outflow tract  obstruction, EF 65-70%, mild LAE, mild TR, grade 1a diastolic dysfunction 05/15/10 (Dr. 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   Sepsis due to pneumonia (HCC) 12/12/2020   Dysphagia 12/12/2020   GERD (gastroesophageal reflux disease) 12/12/2020   Hypoxia    Respiratory distress    Respiration abnormal    Pressure injury of skin 11/23/2020   Protein-calorie malnutrition, severe 11/16/2020   Stroke (HCC) 11/13/2020   Middle cerebral artery embolism, left 11/13/2020   Encounter for feeding tube placement 04/27/2020   Sinus bradycardia 12/25/2019   COPD (chronic obstructive pulmonary disease) (HCC) 12/16/2018   Cough 12/16/2018   Unintentional weight loss 10/05/2018   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   Visit for monitoring Tikosyn therapy 11/18/2017   Closed nondisplaced fracture of head of radius with routine healing 03/03/2017   Coronary  artery disease involving native coronary artery of native heart without angina pectoris    SOB (shortness of breath) 01/26/2016   Acute on chronic diastolic CHF (congestive heart failure) (HCC) 03/28/2015   Paroxysmal atrial fibrillation (HCC)    Essential hypertension    S/P femoral-popliteal bypass surgery    Obstructive hypertrophic cardiomyopathy (HCC) 03/03/2015   PAD (peripheral artery disease) (HCC) 03/02/2015   Acute respiratory failure with hypoxia (HCC) 03/02/2015   Claudication (HCC) 01/31/2015    Former smoker 01/31/2015   Hyperlipidemia 01/31/2015   Atherosclerosis of native arteries of extremity with intermittent claudication (HCC) 02/10/2014   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   Atrial arrhythmia    Mild aortic sclerosis     Past Surgical History:  Procedure Laterality Date   AUGMENTATION MAMMAPLASTY Bilateral    BACK SURGERY     CARDIAC CATHETERIZATION N/A 05/07/2016   Procedure: Left Heart Cath and Coronary Angiography;  Surgeon: Kathleene Hazel, MD;  Location: [facility] INVASIVE CV LAB;  Service: Cardiovascular;  Laterality: N/A;   CARDIOVERSION N/A 09/24/2017   Procedure: CARDIOVERSION;  Surgeon: Laurey Morale, MD;  Location: [facility] ENDOSCOPY;  Service: Cardiovascular;  Laterality: N/A;   DILATION AND CURETTAGE OF UTERUS     ENDARTERECTOMY FEMORAL Right 03/02/2015   Procedure: ENDARTERECTOMY RIGHT FEMORAL;  Surgeon: Chuck Hint, MD;  Location: [facility] OR;  Service: Vascular;  Laterality: Right;   ESOPHAGOGASTRODUODENOSCOPY (EGD) WITH PROPOFOL N/A 12/01/2020   Procedure: ESOPHAGOGASTRODUODENOSCOPY (EGD) WITH PROPOFOL;  Surgeon: Diamantina Monks, MD;  Location: [facility] ENDOSCOPY;  Service: General;  Laterality: N/A;   FACIAL COSMETIC SURGERY Left 2002   "related to Bell's Palsy @ age 48; left eye/side of face droopy; tried to make area symmetrical"   FEMORAL-POPLITEAL BYPASS GRAFT Right 03/02/2015   Procedure: BYPASS GRAFT FEMORAL-BELOW KNEE POPLITEAL ARTERY;  Surgeon: Chuck Hint, MD;  Location: [facility] OR;  Service: Vascular;  Laterality: Right;   INGUINAL HERNIA REPAIR Bilateral 2002   IR ANGIO INTRA EXTRACRAN SEL COM CAROTID INNOMINATE UNI L MOD SED  11/16/2020   IR CT HEAD LTD  11/13/2020   IR PERCUTANEOUS ART THROMBECTOMY/INFUSION INTRACRANIAL INC DIAG ANGIO  11/13/2020   OVARIAN CYST REMOVAL Left    PEG PLACEMENT N/A 12/01/2020   Procedure: PERCUTANEOUS ENDOSCOPIC GASTROSTOMY (PEG) PLACEMENT;  Surgeon: Diamantina Monks, MD;  Location: [facility]  ENDOSCOPY;  Service: General;  Laterality: N/A;   PERIPHERAL VASCULAR CATHETERIZATION N/A 01/16/2015   Procedure: Abdominal Aortogram;  Surgeon: Chuck Hint, MD;  Location: [facility] INVASIVE CV LAB;  Service: Cardiovascular;  Laterality: N/A;   POSTERIOR LUMBAR FUSION  2015   "have plates and screws in there"   RADIOLOGY WITH ANESTHESIA N/A 11/13/2020   Procedure: IR WITH ANESTHESIA;  Surgeon: Radiologist, Medication, MD;  Location: [facility] OR;  Service: Radiology;  Laterality: N/A;   TONSILLECTOMY       OB History   No obstetric history on file.     Family History  Problem Relation Age of Onset   Liver cancer Mother    Cancer Mother        Liver   Hypertension Mother    Lung cancer Father    Cancer Father        Lung   Breast cancer Sister    Cancer Sister        Breast    Social History   Tobacco Use   Smoking status: Former    Packs/day: 1.00    Years: 50.00    Pack years:  50.00    Types: Cigarettes    Quit date: 12/15/2014    Years since quitting: 6.1   Smokeless tobacco: Never  Vaping Use   Vaping Use: Never used  Substance Use Topics   Alcohol use: Yes    Comment: 11/18/2017 "might have a couple glasses of wine/month; if that"    Home Medications Prior to Admission medications   Medication Sig Start Date End Date Taking? Authorizing Provider  acetaminophen (TYLENOL) 325 MG tablet Take 650 mg by mouth every 4 (four) hours as needed for mild pain.    [provider]  amiodarone (PACERONE) 200 MG tablet Place 1 tablet (200 mg total) into feeding tube daily. 12/06/20   Danford, Earl Lites, MD  amLODipine (NORVASC) 2.5 MG tablet Take 2.5 mg by mouth daily. In the morning 8-11 am    [provider]  apixaban (ELIQUIS) 5 MG TABS tablet Place 1 tablet (5 mg total) into feeding tube 2 (two) times daily. 12/06/20   Danford, Earl Lites, MD  budesonide (PULMICORT) 0.5 MG/2ML nebulizer solution Take 2 mLs (0.5 mg total) by nebulization 2 (two) times daily.  12/06/20   Danford, Earl Lites, MD  buPROPion (WELLBUTRIN) 75 MG tablet Place 1 tablet (75 mg total) into feeding tube 2 (two) times daily. 12/06/20   Danford, Earl Lites, MD  doxycycline (VIBRA-TABS) 100 MG tablet Take 1 tablet (100 mg total) by mouth 2 (two) times daily for 7 days. 01/30/21 02/06/21  Octavia Heir, NP  ezetimibe (ZETIA) 10 MG tablet Place 1 tablet (10 mg total) into feeding tube daily. 12/06/20   Danford, Earl Lites, MD  hydrALAZINE (APRESOLINE) 10 MG tablet Take 10 mg by mouth 3 (three) times daily as needed. SBP>165 or DBP >95    [provider]  ipratropium-albuterol (DUONEB) 0.5-2.5 (3) MG/3ML SOLN Take 3 mLs by nebulization every 4 (four) hours as needed. 01/12/21   Angiulli, Mcarthur Rossetti, PA-C  metoprolol tartrate (LOPRESSOR) 50 MG tablet Place 1 tablet (50 mg total) into feeding tube 2 (two) times daily. 01/12/21   Angiulli, Mcarthur Rossetti, PA-C  pantoprazole sodium (PROTONIX) 40 mg/20 mL SUSP Place 40 mg into feeding tube 2 (two) times daily.    [provider]  saccharomyces boulardii (FLORASTOR) 250 MG capsule Take 1 capsule (250 mg total) by mouth 2 (two) times daily for 7 days. 01/30/21 02/06/21  Octavia Heir, NP  Water For Irrigation, Sterile (FREE WATER) SOLN Place 250 mLs into feeding tube 6 (six) times daily. 01/14/21   Angiulli, Mcarthur Rossetti, PA-C    Allergies    Amoxicillin, Atenolol, Crestor [rosuvastatin calcium], Pravastatin, Sulfa antibiotics, and Codeine  Review of Systems   Review of Systems  Unable to perform ROS: Patient nonverbal  Respiratory:  Positive for cough and shortness of breath.   Cardiovascular:  Positive for chest pain.   Physical Exam Updated Vital Signs BP (!) 172/109   Pulse 81   Temp 98.1 F (36.7 C) (Oral)   Resp 16   LMP  (LMP Unknown)   SpO2 99%   Physical Exam CONSTITUTIONAL: Elderly and frail HEAD: Normocephalic/atraumatic EYES: EOMI/PERRL ENMT: Mask in place NECK: supple no meningeal signs SPINE/BACK:entire  spine nontender CV: Irregular, no loud murmurs LUNGS: Patient is tachypneic.  She is on 3 L nasal cannula.  Coarse breath sounds bilaterally ABDOMEN: soft, nontender, PEG tube in place GU:no cva tenderness NEURO: Pt is awake and alert, with obvious right-sided hemiplegia.  Patient expressive aphasia EXTREMITIES: pulses normal/equal, no deformities SKIN:  warm, color normal PSYCH: Unable to assess  ED Results / Procedures / Treatments   Labs (all labs ordered are listed, but only abnormal results are displayed) Labs Reviewed  BASIC METABOLIC PANEL - Abnormal; Notable for the following components:      Result Value   Glucose, Bld 114 (*)    All other components within normal limits  TROPONIN I (HIGH SENSITIVITY) - Abnormal; Notable for the following components:   Troponin I (High Sensitivity) 114 (*)    All other components within normal limits  RESP PANEL BY RT-PCR (FLU A&B, COVID) [id number]  CBC WITH DIFFERENTIAL/PLATELET  BRAIN NATRIURETIC PEPTIDE  TROPONIN I (HIGH SENSITIVITY)    EKG EKG Interpretation  Date/Time:  Friday February 02 2021 05:10:53 EDT Ventricular Rate:  80 PR Interval:    QRS Duration: 102 QT Interval:  406 QTC Calculation: 469 R Axis:   -50 Text Interpretation: Atrial fibrillation Left anterior fascicular block LVH with secondary repolarization abnormality Anterior Q waves, possibly due to LVH No significant change since last tracing Confirmed by Zadie Rhine ([id number]) on 02/02/2021 5:21:54 AM  Radiology DG Chest Port 1 View  Result Date: 02/02/2021 CLINICAL DATA:  Cerebrovascular accident EXAM: PORTABLE CHEST 1 VIEW COMPARISON:  12/12/2020 FINDINGS: Stable cardiac silhouette. Chronic atelectasis and effusion at the RIGHT lung base similar comparison CT. Slight increase in the RIGHT effusion. No pneumothorax. IMPRESSION: Chronic effusion and atelectasis at the RIGHT lung base. Probable increase in the RIGHT effusion. Electronically Signed   By: Genevive Bi M.D.   On: 02/02/2021 05:53    Procedures Procedures   Medications Ordered in ED Medications  albuterol (PROVENTIL) (2.5 MG/3ML) 0.083% nebulizer solution 5 mg (has no administration in time range)  prednisoLONE (ORAPRED) 15 MG/5ML solution 60 mg (has no administration in time range)  albuterol (PROVENTIL) (2.5 MG/3ML) 0.083% nebulizer solution 5 mg (5 mg Nebulization Given 02/02/21 0617)  ipratropium (ATROVENT) nebulizer solution 0.5 mg (0.5 mg Nebulization Given 02/02/21 0617)    ED Course  I have reviewed the triage vital signs and the nursing notes.  Pertinent labs & imaging results that were available during my care of the patient were reviewed by me and considered in my medical decision making (see chart for details).    MDM Rules/Calculators/A&P                           Patient with history of recent stroke presenting with increasing chest pain shortness of breath.  Patient had to have her baseline oxygen increased to 5 L. I called the nursing facility to confirm the story Imaging and labs are pending at this time 7:18 AM Labs are similar to prior.  On review, chest x-ray does reveal pleural effusion which has been present previously She was given a neb treatment with some improvement of her work of breathing.  Husband is at bedside reports they have been difficulty managing her blood pressure and has to use hydralazine PRN Patient still has coarse breath sounds bilaterally but her tachypnea is improving.  I suspect this is a COPD exacerbation. Communicating with patient is difficult due to aphasia. We will treat as COPD, will give another neb treatment, and steroids. Signed out to Dr [name] at shift change  Final Clinical Impression(s) / ED Diagnoses Final diagnoses:  COPD exacerbation (HCC)    Rx / DC Orders ED Discharge Orders     None        Zadie Rhine, MD  02/02/21 0720  

## 2021-02-02 NOTE — ED Notes (Addendum)
EDP into room, at [facility]. Husband at [facility]. Updated. Neb in progress. Home meds ordered. Pt alert, NAD, calm, resps e/u. VSS/ BP elevated.

## 2021-02-02 NOTE — Discharge Instructions (Signed)
Her history, exam, work-up today are consistent with likely COPD exacerbation contributing to the shortness of breath and oxygen troubles today.  As her symptoms have improved after medications and we had a shared decision-making conversation, we feel comfortable with you being discharged to follow-up with PCP and take steroids for the next few days to help.  If any symptoms are to change or worsen, please return to the nearest emergency department as she may need admission if it worsens.

## 2021-02-02 NOTE — ED Provider Notes (Signed)
Care assumed from Dr. Bebe Shaggy.  At time of transfer care, patient is awaiting reassessment after breathing treatment, delta troponin, and BNP results.  Patient reportedly was breathing better and back on her home 3 L.  She was brought in because she had more chest tightness and shortness of breath and at her facility increase the oxygen from 3 to 5 L.  She is back down to the 3.  Patient was felt to likely COPD exacerbation and if she is breathing better per previous team will be able to have several days of a prednisone burst and be discharged to follow-up with a PCP.  If symptoms worsen or do not improve, plan of care will be to admit.   After breathing treatment and steroids, patient is feeling much better.  On home oxygen now.  Patient myself and family had discussion and they would like to try going home with steroid burst.  They will call and follow-up with PCP and if any symptoms change or worsen, they will return for further management.    Clinical Impression: 1. COPD exacerbation (HCC)   2. Dyspnea, unspecified type     Disposition: Discharge  Condition: Good  I have discussed the results, Dx and Tx plan with the pt(& family if present). He/she/they expressed understanding and agree(s) with the plan. Discharge instructions discussed at great length. Strict return precautions discussed and pt &/or family have verbalized understanding of the instructions. No further questions at time of discharge.    New Prescriptions   PREDNISONE 5 MG/5ML SOLUTION    Place 30 mLs (30 mg total) into feeding tube daily with breakfast for 4 days.    Follow Up: [name], MD [address] [place] [address] [phone]     [facility] EMERGENCY DEPARTMENT [address] [id number] mc [place] [address] [phone]       Cailen Mihalik, Canary Brim, MD 02/02/21 1258

## 2021-02-02 NOTE — ED Notes (Signed)
MD notified of critical troponin.

## 2021-02-02 NOTE — ED Notes (Signed)
[facility] called to transport patient  

## 2021-02-02 NOTE — ED Triage Notes (Signed)
Pt BIB EMS from [facility]. Per EMS pt has hx of 2 strokes in the past 30 days - right side paralyzed from stroke. On eliquis. Pt is able to have conversation and answer some questions but slow to answer. Pt also recently diagnosed with pneumonia and being treated with abx and duonebs.  Tonight pt started having CP and SOB. Pt normally wears 3L at baseline but bumped up to 5L. EMS gave one nitro and CP improved.  Pt hypertensive with EMS 176/110 20LFA

## 2021-02-05 ENCOUNTER — Non-Acute Institutional Stay (SKILLED_NURSING_FACILITY): Payer: Medicare Other | Admitting: Adult Health

## 2021-02-05 ENCOUNTER — Encounter: Payer: Self-pay | Admitting: Adult Health

## 2021-02-05 DIAGNOSIS — I421 Obstructive hypertrophic cardiomyopathy: Secondary | ICD-10-CM

## 2021-02-05 DIAGNOSIS — R2689 Other abnormalities of gait and mobility: Secondary | ICD-10-CM | POA: Diagnosis not present

## 2021-02-05 DIAGNOSIS — I10 Essential (primary) hypertension: Secondary | ICD-10-CM | POA: Diagnosis not present

## 2021-02-05 DIAGNOSIS — I69391 Dysphagia following cerebral infarction: Secondary | ICD-10-CM

## 2021-02-05 DIAGNOSIS — J9601 Acute respiratory failure with hypoxia: Secondary | ICD-10-CM | POA: Diagnosis not present

## 2021-02-05 DIAGNOSIS — R278 Other lack of coordination: Secondary | ICD-10-CM | POA: Diagnosis not present

## 2021-02-05 DIAGNOSIS — J441 Chronic obstructive pulmonary disease with (acute) exacerbation: Secondary | ICD-10-CM | POA: Diagnosis not present

## 2021-02-05 DIAGNOSIS — M6389 Disorders of muscle in diseases classified elsewhere, multiple sites: Secondary | ICD-10-CM | POA: Diagnosis not present

## 2021-02-05 DIAGNOSIS — M62561 Muscle wasting and atrophy, not elsewhere classified, right lower leg: Secondary | ICD-10-CM | POA: Diagnosis not present

## 2021-02-05 DIAGNOSIS — R293 Abnormal posture: Secondary | ICD-10-CM | POA: Diagnosis not present

## 2021-02-05 DIAGNOSIS — J690 Pneumonitis due to inhalation of food and vomit: Secondary | ICD-10-CM | POA: Diagnosis not present

## 2021-02-05 DIAGNOSIS — I480 Paroxysmal atrial fibrillation: Secondary | ICD-10-CM | POA: Diagnosis not present

## 2021-02-05 DIAGNOSIS — I63312 Cerebral infarction due to thrombosis of left middle cerebral artery: Secondary | ICD-10-CM | POA: Diagnosis not present

## 2021-02-05 DIAGNOSIS — I69351 Hemiplegia and hemiparesis following cerebral infarction affecting right dominant side: Secondary | ICD-10-CM | POA: Diagnosis not present

## 2021-02-05 NOTE — Progress Notes (Addendum)
Location:   [facility] Room Number: 116 Place of Service:  SNF [phone]) Provider:  Fletcher Anon, NP  [name], MD  Patient Care Team: [name], MD as PCP - General (Internal Medicine) [name], MD as PCP - Cardiology (Cardiology) Duke Salvia, MD as PCP - Electrophysiology (Cardiology) Shirlean Kelly, MD as Consulting Physician (Neurosurgery)  Extended Emergency Contact Information Primary Emergency Contact: Lamar,[name] Address: [address]          [place] [phone] [name] Home Phone: [phone] Mobile Phone: [phone] Relation: Spouse Secondary Emergency Contact: Gerald,[name] Address: [address]          [place], [place] [address] [name] Mobile Phone: [phone] Relation: Son  Code Status:  Full Code Goals of care: Advanced Directive information Advanced Directives 02/05/2021  Does Patient Have a Medical Advance Directive? Yes  Type of Advance Directive Living will  Does patient want to make changes to medical advance directive? No - Patient declined  Copy of Healthcare Power of Attorney in Chart? -  Would patient like information on creating a medical advance directive? -     Chief Complaint  Patient presents with   Acute Visit    Right foot edema     HPI:  Pt is a 75 y.o. female seen today for an acute visit for right foot edema.  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 Second hospitalization from 08/30- 09/08 due to pneumonia/sepsis.   Then was in the hospital 01/22/21-01/23/21 with progression of left MCA stroke.   01/30/21: treated for aspiration pna of the right lobe with doxycycline.   She was sent to the ED 02/02/21 and diagnosed with COPD exacerbation and placed on prednisone. Prior hx of smoking but did not have issues with COPD until the CVA. She is on 3 liters of oxygen sats 96%. Did not  require oxygen prior to CVA.  CXR 02/02/21 IMPRESSION: Chronic effusion and atelectasis at the RIGHT lung base. Probable increase in the RIGHT effusion.  02/02/21 Trop 108, BNP 1,459 (lBNP 970.9 12/12/20), CBC and BMP were ok   Noted hx of obstructive cardiomyopathy 2D echo 01/23/21: EF 60% with severe LVH with mild reduced right ventricular systolic function and normal pulmonary artery pressure.   Her husband elevated her foot over the weekend and edema improved.  She reports her breathing has improved and she does not have a cough. Continues on 3 liters of oxygen sats in the 90s.    BPs 120/86 140/94 151/90 117/70, improved with norvasc addition Past Medical History:  Diagnosis Date   Anxiety    Arthritis    "some in my lower back; probably elbows, knees" (11/18/2017)   Atrial fibrillation (HCC)    Bell's palsy    when pt. was 75 yrs old, when under stress the left side of face will droop.   Complication of anesthesia    "vascular OR 2016; BP bottomed out; couldn't get it regulated; ended up in ICU for DAYS" (11/18/2017)   GERD (gastroesophageal reflux disease)    History of kidney stones    Hypertension    Hypertrophic cardiomyopathy (HCC)    severe LV basilar hypertrophy witn no evidence of significant outflow tract obstruction, EF 65-70%, mild LAE, mild TR, grade 1a diastolic dysfunction 05/15/10 (Dr. Donato Schultz) (Atrial Septal Hypertrophy pattern)-- Intra-op TEE with dsignificant outflow tract obstruction - AI, MR & TR   Insomnia  Mild aortic sclerosis    Osteopenia    Peripheral vascular disease (HCC)    Syncope    , Vagal   Past Surgical History:  Procedure Laterality Date   AUGMENTATION MAMMAPLASTY Bilateral    BACK SURGERY     CARDIAC CATHETERIZATION N/A 05/07/2016   Procedure: Left Heart Cath and Coronary Angiography;  Surgeon: Kathleene Hazel, MD;  Location: [facility] INVASIVE CV LAB;  Service: Cardiovascular;  Laterality: N/A;   CARDIOVERSION N/A 09/24/2017    Procedure: CARDIOVERSION;  Surgeon: Laurey Morale, MD;  Location: [facility] ENDOSCOPY;  Service: Cardiovascular;  Laterality: N/A;   DILATION AND CURETTAGE OF UTERUS     ENDARTERECTOMY FEMORAL Right 03/02/2015   Procedure: ENDARTERECTOMY RIGHT FEMORAL;  Surgeon: Chuck Hint, MD;  Location: [facility] OR;  Service: Vascular;  Laterality: Right;   ESOPHAGOGASTRODUODENOSCOPY (EGD) WITH PROPOFOL N/A 12/01/2020   Procedure: ESOPHAGOGASTRODUODENOSCOPY (EGD) WITH PROPOFOL;  Surgeon: Diamantina Monks, MD;  Location: [facility] ENDOSCOPY;  Service: General;  Laterality: N/A;   FACIAL COSMETIC SURGERY Left 2002   "related to Bell's Palsy @ age 54; left eye/side of face droopy; tried to make area symmetrical"   FEMORAL-POPLITEAL BYPASS GRAFT Right 03/02/2015   Procedure: BYPASS GRAFT FEMORAL-BELOW KNEE POPLITEAL ARTERY;  Surgeon: Chuck Hint, MD;  Location: [facility] OR;  Service: Vascular;  Laterality: Right;   INGUINAL HERNIA REPAIR Bilateral 2002   IR ANGIO INTRA EXTRACRAN SEL COM CAROTID INNOMINATE UNI L MOD SED  11/16/2020   IR CT HEAD LTD  11/13/2020   IR PERCUTANEOUS ART THROMBECTOMY/INFUSION INTRACRANIAL INC DIAG ANGIO  11/13/2020   OVARIAN CYST REMOVAL Left    PEG PLACEMENT N/A 12/01/2020   Procedure: PERCUTANEOUS ENDOSCOPIC GASTROSTOMY (PEG) PLACEMENT;  Surgeon: Diamantina Monks, MD;  Location: [facility] ENDOSCOPY;  Service: General;  Laterality: N/A;   PERIPHERAL VASCULAR CATHETERIZATION N/A 01/16/2015   Procedure: Abdominal Aortogram;  Surgeon: Chuck Hint, MD;  Location: [facility] INVASIVE CV LAB;  Service: Cardiovascular;  Laterality: N/A;   POSTERIOR LUMBAR FUSION  2015   "have plates and screws in there"   RADIOLOGY WITH ANESTHESIA N/A 11/13/2020   Procedure: IR WITH ANESTHESIA;  Surgeon: Radiologist, Medication, MD;  Location: [facility] OR;  Service: Radiology;  Laterality: N/A;   TONSILLECTOMY      Allergies  Allergen Reactions   Amoxicillin Other (See Comments)    UTI Has patient had a PCN reaction causing  immediate rash, facial/tongue/throat swelling, SOB or lightheadedness with hypotension: No Has patient had a PCN reaction causing severe rash involving mucus membranes or skin necrosis: No Has patient had a PCN reaction that required hospitalization: No Has patient had a PCN reaction occurring within the last 10 years: Yes--UTI ONLY If all of the above answers are "NO", then may proceed with Cephalosporin use.    Atenolol Cough   Crestor [Rosuvastatin Calcium] Other (See Comments)    Muscle aches   Pravastatin Other (See Comments)    Muscle aches   Sulfa Antibiotics Nausea Only   Codeine Other (See Comments)    hallucinations    Allergies as of 02/05/2021       Reactions   Amoxicillin Other (See Comments)   UTI Has patient had a PCN reaction causing immediate rash, facial/tongue/throat swelling, SOB or lightheadedness with hypotension: No Has patient had a PCN reaction causing severe rash involving mucus membranes or skin necrosis: No Has patient had a PCN reaction that required hospitalization: No Has patient had a PCN reaction occurring within the last 10 years: Yes--UTI  ONLY If all of the above answers are "NO", then may proceed with Cephalosporin use.   Atenolol Cough   Crestor [rosuvastatin Calcium] Other (See Comments)   Muscle aches   Pravastatin Other (See Comments)   Muscle aches   Sulfa Antibiotics Nausea Only   Codeine Other (See Comments)   hallucinations        Medication List        Accurate as of February 05, 2021  9:20 AM. If you have any questions, ask your nurse or doctor.          STOP taking these medications    alum & mag hydroxide-simeth 200-200-20 MG/5ML suspension Commonly known as: MAALOX/MYLANTA Stopped by: Fletcher Anon, NP       TAKE these medications    acetaminophen 325 MG tablet Commonly known as: TYLENOL Take 650 mg by mouth every 4 (four) hours as needed for mild pain.   amiodarone 200 MG tablet Commonly known as:  PACERONE Place 1 tablet (200 mg total) into feeding tube daily.   amLODipine 5 MG tablet Commonly known as: NORVASC Take 5 mg by mouth daily. In the morning 8-11 am   apixaban 5 MG Tabs tablet Commonly known as: ELIQUIS Place 1 tablet (5 mg total) into feeding tube 2 (two) times daily.   budesonide 0.5 MG/2ML nebulizer solution Commonly known as: PULMICORT Take 2 mLs (0.5 mg total) by nebulization 2 (two) times daily.   buPROPion 75 MG tablet Commonly known as: WELLBUTRIN Place 1 tablet (75 mg total) into feeding tube 2 (two) times daily.   doxycycline 100 MG tablet Commonly known as: VIBRA-TABS Take 1 tablet (100 mg total) by mouth 2 (two) times daily for 7 days.   ezetimibe 10 MG tablet Commonly known as: ZETIA Place 1 tablet (10 mg total) into feeding tube daily.   free water Soln Place 250 mLs into feeding tube 6 (six) times daily.   hydrALAZINE 10 MG tablet Commonly known as: APRESOLINE Take 10 mg by mouth 3 (three) times daily as needed. SBP>165 or DBP >95   ipratropium-albuterol 0.5-2.5 (3) MG/3ML Soln Commonly known as: DUONEB Take 3 mLs by nebulization every 4 (four) hours as needed.   metoprolol tartrate 50 MG tablet Commonly known as: LOPRESSOR Place 1 tablet (50 mg total) into feeding tube 2 (two) times daily.   pantoprazole sodium 40 mg/20 mL Susp Commonly known as: PROTONIX Place 40 mg into feeding tube 2 (two) times daily.   predniSONE 5 MG/5ML solution Place 30 mLs (30 mg total) into feeding tube daily with breakfast for 4 days.   saccharomyces boulardii 250 MG capsule Commonly known as: Florastor Take 1 capsule (250 mg total) by mouth 2 (two) times daily for 7 days.        Review of Systems  Constitutional:  Positive for activity change. Negative for appetite change, chills, diaphoresis, fatigue, fever and unexpected weight change.  HENT:  Positive for trouble swallowing. Negative for congestion.   Respiratory:  Negative for cough,  shortness of breath and wheezing.   Cardiovascular:  Positive for leg swelling. Negative for chest pain and palpitations.  Gastrointestinal:  Negative for abdominal distention, abdominal pain, constipation and diarrhea.  Genitourinary:  Negative for difficulty urinating and dysuria.  Musculoskeletal:  Positive for gait problem. Negative for arthralgias, back pain, joint swelling and myalgias.  Neurological:  Positive for facial asymmetry, speech difficulty and weakness. Negative for dizziness, tremors, seizures, syncope, light-headedness, numbness and headaches.  Psychiatric/Behavioral:  Negative for agitation, behavioral  problems and confusion.    Immunization History  Administered Date(s) Administered   Influenza Split 01/16/2009, 04/30/2010, 01/26/2016, 02/07/2018, 02/01/2020   Influenza, High Dose Seasonal PF 02/07/2018, 01/25/2019   Influenza,inj,Quad PF,6+ Mos 03/06/2012, 01/26/2016, 01/10/2017   Influenza,inj,quad, With Preservative 02/14/2015   PFIZER(Purple Top)SARS-COV-2 Vaccination 05/06/2019, 05/27/2019, 01/27/2020, 09/18/2020   Pneumococcal Conjugate-13 03/19/2017   Pneumococcal Polysaccharide-23 02/14/2015   Td 02/25/2005   Tdap 05/19/2013   Zoster, Live 07/28/2006   Pertinent  Health Maintenance Due  Topic Date Due   COLONOSCOPY (Pts 45-65yrs Insurance coverage will need to be confirmed)  Never done   DEXA SCAN  Never done   INFLUENZA VACCINE  11/13/2020   Fall Risk  11/02/2018 09/03/2018  Falls in the past year? 0 1  Number falls in past yr: 0 0  Injury with Fall? 0 1  Risk for fall due to : - History of fall(s)  Follow up - Falls evaluation completed   Functional Status Survey:    Vitals:   02/05/21 0857  BP: (!) 140/94  Pulse: 84  Resp: 20  Temp: (!) 97.3 F (36.3 C)  SpO2: 97%  Weight: 101 lb 9.6 oz (46.1 kg)  Height: 5\' 3"  (1.6 m)   Body mass index is 18 kg/m. Physical Exam Vitals and nursing note reviewed.  Constitutional:      General: She is  not in acute distress.    Appearance: She is not diaphoretic.  HENT:     Head: Normocephalic and atraumatic.     Nose: Nose normal.     Mouth/Throat:     Mouth: Mucous membranes are moist.     Pharynx: Oropharynx is clear.     Comments: dysarthria Eyes:     Conjunctiva/sclera: Conjunctivae normal.     Pupils: Pupils are equal, round, and reactive to light.  Neck:     Vascular: No JVD.  Cardiovascular:     Rate and Rhythm: Normal rate. Rhythm irregular.     Heart sounds: No murmur heard. Pulmonary:     Effort: Pulmonary effort is normal. No respiratory distress.     Breath sounds: Normal breath sounds. No wheezing.  Abdominal:     General: Bowel sounds are normal. There is no distension.     Palpations: Abdomen is soft.     Tenderness: There is no abdominal tenderness.  Musculoskeletal:     Right lower leg: Edema (trace) present.     Left lower leg: No edema.  Skin:    General: Skin is warm and dry.  Neurological:     Mental Status: She is alert.     Comments: Right sided hemiplegia. Right sided facial droop. Alert and oriented.   Psychiatric:        Mood and Affect: Mood normal.    Labs reviewed: Recent Labs    11/29/20 0128 11/30/20 0826 12/02/20 0216 12/04/20 0052 01/22/21 03/24/21 01/23/21 0259 01/30/21 0000 02/02/21 0519  NA 151*   < > 140   < > 138 135 143 139  K 4.4   < > 4.2   < > 3.9 3.6 4.0 3.5  CL 113*   < > 108   < > 98 94* 99 100  CO2 30   < > 25   < >  --  28 31* 30  GLUCOSE 140*   < > 181*   < > 106* 85  --  114*  BUN 60*   < > 51*   < > 19 14 13  13 21  CREATININE 0.75   < > 0.79   < > 0.90 0.85 0.7  0.7 0.72  CALCIUM 8.9   < > 8.4*   < >  --  8.7* 9.6  9.6 8.9  MG 2.3  --  2.2  --   --  2.0  --   --   PHOS 3.4  --  3.0  --   --  4.1  --   --    < > = values in this interval not displayed.   Recent Labs    12/22/20 0409 01/22/21 0651 01/23/21 0259  AST 35 39 28  ALT 47* 38 34  ALKPHOS 69 68 57  BILITOT 0.6 1.3* 1.5*  PROT 5.6* 6.1*  5.6*  ALBUMIN 2.6* 3.4* 3.1*   Recent Labs    01/15/21 0603 01/22/21 0651 01/22/21 0658 01/23/21 0259 01/30/21 0000 02/02/21 0519  WBC 8.8 10.7*  --  8.9 9.3 9.3  NEUTROABS 5.5 7.7  --   --   --  7.2  HGB 15.3* 15.5*   < > 14.5 15.1 14.4  HCT 47.5* 48.4*   < > 45.8 48* 45.3  MCV 95.2 96.4  --  95.0  --  95.4  PLT 234 237  --  195 261 286   < > = values in this interval not displayed.   Lab Results  Component Value Date   TSH 1.087 11/18/2020   Lab Results  Component Value Date   HGBA1C 6.0 (H) 11/13/2020   Lab Results  Component Value Date   CHOL 209 (H) 11/13/2020   HDL 70 11/13/2020   LDLCALC 120 (H) 11/13/2020   TRIG 97 11/13/2020   CHOLHDL 3.0 11/13/2020    Significant Diagnostic Results in last 30 days:  MR BRAIN WO CONTRAST  Result Date: 01/22/2021 CLINICAL DATA:  Transient ischemic attack EXAM: MRI HEAD WITHOUT CONTRAST TECHNIQUE: Multiplanar, multiecho pulse sequences of the brain and surrounding structures were obtained without intravenous contrast. COMPARISON:  Same day CT/CTA head and neck, MR head 11/14/2020 FINDINGS: Brain: There is diffusion restriction with associated T2/FLAIR signal abnormality in the left ACA and MCA distribution, some of which was present on the prior study from 11/14/2020 and has decreased in conspicuity; however, there is increased diffusion restriction in the left basal ganglia, internal and external capsules, extending to the caudate body/corona radiata. Findings suspicious for acute infarct superimposed on evolving subacute infarcts. Curvilinear SWI signal dropout along the left precentral sulcus may reflect petechial hemorrhage. There is an additional focus of SWI signal dropout in the left superior frontal gyrus which may reflect additional petechial hemorrhage versus punctate chronic microhemorrhage. Vascular: The right ICA flow void is absent through the communicating segment, in keeping with the findings on the prior CTA neck. Skull  and upper cervical spine: Normal marrow signal. Sinuses/Orbits: There is mild mucosal thickening in the paranasal sinuses. The globes and orbits are unremarkable. Other: None. IMPRESSION: 1. New/increased diffusion restriction in the left basal ganglia, internal and external capsules, extending to the caudate body/corona radiata compared to the study from 11/14/2020 consistent with acute to subacute infarct. 2. Additional diffusion restriction in the left MCA and ACA territories consistent with evolving subacute infarct as seen on the study from 11/14/2020. 3. Curvilinear SWI signal dropout along the left precentral sulcus consistent with petechial hemorrhage. There is an additional focus of petechial hemorrhage versus punctate chronic microhemorrhage in the left superior frontal gyrus. Electronically Signed   By: Selena Lesser.D.  On: 01/22/2021 10:20   DG Chest Port 1 View  Result Date: 02/02/2021 CLINICAL DATA:  Cerebrovascular accident EXAM: PORTABLE CHEST 1 VIEW COMPARISON:  12/12/2020 FINDINGS: Stable cardiac silhouette. Chronic atelectasis and effusion at the RIGHT lung base similar comparison CT. Slight increase in the RIGHT effusion. No pneumothorax. IMPRESSION: Chronic effusion and atelectasis at the RIGHT lung base. Probable increase in the RIGHT effusion. Electronically Signed   By: Genevive Bi M.D.   On: 02/02/2021 05:53   ECHOCARDIOGRAM COMPLETE  Result Date: 01/23/2021    ECHOCARDIOGRAM REPORT   Patient Name:   WILMETTA SPEISER Cassedy Date of Exam: 01/23/2021 Medical Rec #:  [id number]       Height:       63.0 in Accession #:    [id number]      Weight:       99.1 lb Date of Birth:  1945/06/19       BSA:          1.435 m Patient Age:    75 years        BP:           158/86 mmHg Patient Gender: F               HR:           80 bpm. Exam Location:  Inpatient Procedure: 2D Echo, Cardiac Doppler and Color Doppler Indications:    Abnormal ECG R94.31  History:        Patient has prior history of  Echocardiogram examinations, most                 recent 11/14/2020. CHF, PAD, Arrythmias:Atrial Fibrillation; Risk                 Factors:Sleep Apnea and Hypertension. Hypertrophic                 cardiomyopathy.  Sonographer:    Leta Jungling RDCS Referring Phys: [phone] LINDSAY B ROBERTS IMPRESSIONS  1. Left ventricular ejection fraction, by estimation, is 60%. The left ventricle has normal function. The left ventricle has no regional wall motion abnormalities. There is severe left ventricular hypertrophy. Peak gradient of 2 mm Hg; there is no evidence of LVOT Obstruction in this study. No evidence of apical aneursysm. Left ventricular diastolic parameters are indeterminate.  2. Right ventricular systolic function is mildly reduced. The right ventricular size is normal. There is normal pulmonary artery systolic pressure.  3. Left atrial size was mildly dilated.  4. Right atrial size was moderately dilated.  5. The mitral valve is degenerative. Mild mitral valve regurgitation. The mean mitral valve gradient is 4.0 mmHg with average heart rate of 72 bpm.  6. The aortic valve is tricuspid. There is moderate calcification of the aortic valve. Aortic valve regurgitation is mild. Mild to moderate aortic valve sclerosis/calcification is present, without any evidence of aortic stenosis.  7. Aortic dilatation noted. There is mild dilatation of the ascending aorta, measuring 40 mm. Comparison(s): A prior study was performed on 11/14/2020. No significant change from prior study. FINDINGS  Left Ventricle: Left ventricular ejection fraction, by estimation, is 60%. The left ventricle has normal function. The left ventricle has no regional wall motion abnormalities. The left ventricular internal cavity size was small. There is severe left ventricular hypertrophy. Left ventricular diastolic parameters are indeterminate. Right Ventricle: The right ventricular size is normal. Right vetricular wall thickness was not well visualized.  Right ventricular systolic function is mildly reduced. There is normal  pulmonary artery systolic pressure. The tricuspid regurgitant velocity is 2.62 m/s, and with an assumed right atrial pressure of 8 mmHg, the estimated right ventricular systolic pressure is 35.5 mmHg. Left Atrium: Left atrial size was mildly dilated. Right Atrium: Right atrial size was moderately dilated. Pericardium: There is no evidence of pericardial effusion. Mitral Valve: The mitral valve is degenerative in appearance. Mild mitral valve regurgitation. MV peak gradient, 7.9 mmHg. The mean mitral valve gradient is 4.0 mmHg with average heart rate of 72 bpm. Tricuspid Valve: The tricuspid valve is grossly normal. Tricuspid valve regurgitation is mild . No evidence of tricuspid stenosis. Aortic Valve: The aortic valve is tricuspid. There is moderate calcification of the aortic valve. Aortic valve regurgitation is mild. Aortic regurgitation PHT measures 1128 msec. Mild to moderate aortic valve sclerosis/calcification is present, without any evidence of aortic stenosis. Pulmonic Valve: The pulmonic valve was not well visualized. Pulmonic valve regurgitation is mild to moderate. No evidence of pulmonic stenosis. Aorta: Aortic dilatation noted. There is mild dilatation of the ascending aorta, measuring 40 mm. IAS/Shunts: The atrial septum is grossly normal.  LEFT VENTRICLE PLAX 2D LVIDd:         3.30 cm     Diastology LVIDs:         2.30 cm     LV e' medial:    3.53 cm/s LV PW:         1.30 cm     LV E/e' medial:  27.3 LV IVS:        2.00 cm     LV e' lateral:   5.89 cm/s LVOT diam:     2.00 cm     LV E/e' lateral: 16.4 LV SV:         35 LV SV Index:   25 LVOT Area:     3.14 cm  LV Volumes (MOD) LV vol d, MOD A2C: 69.1 ml LV vol d, MOD A4C: 81.8 ml LV vol s, MOD A2C: 28.1 ml LV vol s, MOD A4C: 35.3 ml LV SV MOD A2C:     41.0 ml LV SV MOD A4C:     81.8 ml LV SV MOD BP:      43.1 ml RIGHT VENTRICLE RV S prime:     8.93 cm/s TAPSE (M-mode): 1.1 cm  LEFT ATRIUM             Index        RIGHT ATRIUM           Index LA diam:        4.20 cm 2.93 cm/m   RA Area:     22.10 cm LA Vol (A2C):   43.5 ml 30.32 ml/m  RA Volume:   63.60 ml  44.33 ml/m LA Vol (A4C):   56.0 ml 39.03 ml/m LA Biplane Vol: 53.5 ml 37.29 ml/m  AORTIC VALVE LVOT Vmax:   76.00 cm/s LVOT Vmean:  52.500 cm/s LVOT VTI:    0.112 m AI PHT:      1128 msec  AORTA Ao Root diam: 3.40 cm Ao Asc diam:  3.95 cm MITRAL VALVE               TRICUSPID VALVE MV Area (PHT): 4.65 cm    TR Peak grad:   27.5 mmHg MV Area VTI:   1.52 cm    TR Vmax:        262.00 cm/s MV Peak grad:  7.9 mmHg MV Mean grad:  4.0 mmHg  SHUNTS MV Vmax:       1.40 m/s    Systemic VTI:  0.11 m MV Vmean:      88.9 cm/s   Systemic Diam: 2.00 cm MV Decel Time: 163 msec MV E velocity: 96.43 cm/s Riley Lam MD Electronically signed by Riley Lam MD Signature Date/Time: 01/23/2021/10:36:35 AM    Final    CT HEAD CODE STROKE WO CONTRAST  Result Date: 01/22/2021 CLINICAL DATA:  Code stroke. 75 year old female with neurologic deficit. Left ACA and MCA territory infarcts in August. EXAM: CT HEAD WITHOUT CONTRAST TECHNIQUE: Contiguous axial images were obtained from the base of the skull through the vertex without intravenous contrast. COMPARISON:  Brain MRI 11/14/2020, head CT 11/13/2020. FINDINGS: Brain: Hypodensity in the left MCA and ACA territories corresponding to restricted diffusion last month, resembling developing encephalomalacia. No new areas of cytotoxic edema identified. Heterogeneity in the right deep gray matter nuclei and cerebellum compatible with small chronic infarcts appears stable. Additional patchy bilateral white matter hypodensity appears stable to FLAIR signal abnormality last month. No midline shift, ventriculomegaly, mass effect, evidence of mass lesion, intracranial hemorrhage or evidence of cortically based acute infarction. Vascular: Calcified atherosclerosis at the skull base. No new  hyperdense vessel identified. Skull: No acute osseous abnormality identified. Sinuses/Orbits: Visualized paranasal sinuses and mastoids are stable and well aerated. Other: Stable visible face, orbit, and scalp soft tissues. ASPECTS [facility] Stroke Program Early CT Score) Total score (0-10 with 10 being normal): 10, when allowing for evolution of left MCA and ACA infarcts since August. IMPRESSION: 1. Expected evolution of the left MCA and ACA territory infarcts since last month. Underlying chronic small vessel disease. 2. No new cortically based infarct or acute intracranial hemorrhage identified. ASPECTS 10. 3. These results were communicated to Drs. Lindzen and Collins at [employer] am on 01/22/2021 by text page via the [employer] system. Electronically Signed   By: Odessa Fleming M.D.   On: 01/22/2021 07:13   CT ANGIO HEAD NECK W WO CM W PERF (CODE STROKE)  Result Date: 01/22/2021 CLINICAL DATA:  75 year old female code stroke. Status post left M1 occlusion, left ACA stenosis, ACA and MCA infarcts in August. EXAM: CT ANGIOGRAPHY HEAD AND NECK CT PERFUSION BRAIN TECHNIQUE: Multidetector CT imaging of the head and neck was performed using the standard protocol during bolus administration of intravenous contrast. Multiplanar CT image reconstructions and MIPs were obtained to evaluate the vascular anatomy. Carotid stenosis measurements (when applicable) are obtained utilizing NASCET criteria, using the distal internal carotid diameter as the denominator. Multiphase CT imaging of the brain was performed following IV bolus contrast injection. Subsequent parametric perfusion maps were calculated using RAPID software. CONTRAST:  100 mL Omnipaque 350 COMPARISON:  Plain head CT 0659 hours today.  Brain MRI 11/14/2020. CTA head and neck 11/13/2020. FINDINGS: CT Brain Perfusion Findings: ASPECTS: 10 (of all vein left MCA and ACA infarcts since August). CBF (<30%) Volume: 0 Perfusion (Tmax>6.0s) volume: 0 Mismatch Volume: Not  applicable Infarction Location:Not applicable CTA NECK Skeleton: Stable.  No acute osseous abnormality identified. Upper chest: Stable upper lung septal thickening with mild to moderate centrilobular emphysema. No superior mediastinal lymphadenopathy identified. Other neck: Stable. Aortic arch: Calcified aortic atherosclerosis. Three vessel arch configuration better demonstrated on the August CTA. Right carotid system: Visible brachiocephalic artery and right CCA origin are patent with plaque but no significant stenosis. 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 As before, no right  ICA reconstitution to the skull base. Left carotid system: Stable left CCA plaque without stenosis. Bulky calcified plaque at the left ICA origin and bulb is stable with stenosis numerically estimated at 70 % with respect to the distal vessel. The left ICA remains patent to the skull base without additional stenosis. Vertebral arteries: Stable proximal right subclavian and right vertebral artery plaque with moderate stenosis of the non dominant right vertebral artery origin. The right vertebral remains patent to the skull base, diminutive, but with no additional stenosis. Stable proximal left subclavian artery plaque without stenosis. Mild calcified plaque at the left vertebral artery origin without stenosis. Dominant left vertebral with tortuous V1 segment. The left vertebral remains patent to the skull base without stenosis. CTA HEAD Posterior circulation: Non dominant right vertebral artery continues to the vertebrobasilar junction without stenosis. Right AICA appears dominant and patent. Dominant left vertebral V4 segment calcified plaque with mild to moderate stenosis appears stable (series 7, image 2 95). Stenosis is proximal to the normal left PICA origin and vertebrobasilar junction. Patent basilar artery with mild irregularity but no stenosis. Patent SCA and left  PCA origins. Fetal type right PCA origin but with patent communication to the right SCA as before. Left posterior communicating is diminutive or absent. Bilateral PCA branches are stable and within normal limits. Anterior circulation: Right ICA terminus reconstituted from the posterior communicating artery as before. No right ICA siphon enhancement. Right MCA and ACA origins remain patent and within normal limits. Left ICA siphon calcified plaque with mild to moderate supraclinoid stenosis is stable. Patent left MCA and ACA origins. Normal anterior communicating artery. ACA A2 segments are patent without stenosis. Improved appearance of distal left ACA branches since August. No discrete branch occlusion now. Left MCA M1 segment has recanalized and is patent to the trifurcation without stenosis. Some left MCA branches are attenuated concordant with the recent infarction. No M2 branch occlusion is evident. Right MCA M1 segment and bifurcation are patent without stenosis. Right MCA branches appear stable and within normal limits. Venous sinuses: Early contrast timing, grossly patent. Anatomic variants: Dominant left vertebral artery. Fetal type right PCA origin. Review of the MIP images confirms the above findings IMPRESSION: 1. Negative for emergent large vessel occlusion and no infarct core or ischemic penumbra detected by CT Perfusion. 2. 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 3. Recanalized and or improved Left MCA and distal Left ACA branches since August. 4. Stable atherosclerosis notable for: - high-grade proximal Left ICA stenosis estimated at 70%, due to bulky calcified plaque. - up to moderate bilateral vertebral artery stenosis (dominant left vertebral V4 segment and non dominant right vertebral artery origin). - mild to moderate supraclinoid Left ICA siphon stenosis due to calcified plaque. 5. Aortic  Atherosclerosis (ICD10-I70.0). Emphysema (ICD10-J43.9). Salient findings discussed by telephone with Dr. Caryl Pina on 01/22/2021 at 07:36 . Electronically Signed   By: Odessa Fleming M.D.   On: 01/22/2021 07:41    Assessment/Plan  1. Aspiration pneumonia of right middle lobe, unspecified aspiration pneumonia type (HCC) Improving Continue Doxycycline F/U CXR   2. Obstructive hypertrophic cardiomyopathy (HCC) Followed by cardiology Has chronically elevated BNP which is rising. Could consider diuresis if symptoms of sob with exertion resurface but she is prone to dehydration which is covered with water flushes Monitor weights.   3. COPD exacerbation (HCC) Improved with prednisone  Continue to complete 5 day course.  Continue Pulmicort bid Consider  Brovana if needed Continue Duoneb q 4 prn   4. Acute respiratory failure with hypoxia (HCC) Due to pna and COPD  Continue to titrate oxygen for sats> or = to 90%  5. Dysphagia, post-stroke Continues on D2 diet with NTL Working with ST Has peg tube for water flushes  6. Essential hypertension Improved control with the addition of norvasc  Continue prn hydralazine   7. Afib Rate is controlled with amiodarone and lopressor.  Continue Eliquis for CVA risk reduction   Family/ staff Communication: discussed with resident and her husband   Labs/tests ordered:   f/u CXR in 2 weeks

## 2021-02-06 DIAGNOSIS — R2689 Other abnormalities of gait and mobility: Secondary | ICD-10-CM | POA: Diagnosis not present

## 2021-02-06 DIAGNOSIS — I69351 Hemiplegia and hemiparesis following cerebral infarction affecting right dominant side: Secondary | ICD-10-CM | POA: Diagnosis not present

## 2021-02-06 DIAGNOSIS — R278 Other lack of coordination: Secondary | ICD-10-CM | POA: Diagnosis not present

## 2021-02-06 DIAGNOSIS — M62561 Muscle wasting and atrophy, not elsewhere classified, right lower leg: Secondary | ICD-10-CM | POA: Diagnosis not present

## 2021-02-06 DIAGNOSIS — I63312 Cerebral infarction due to thrombosis of left middle cerebral artery: Secondary | ICD-10-CM | POA: Diagnosis not present

## 2021-02-07 DIAGNOSIS — R278 Other lack of coordination: Secondary | ICD-10-CM | POA: Diagnosis not present

## 2021-02-07 DIAGNOSIS — M6389 Disorders of muscle in diseases classified elsewhere, multiple sites: Secondary | ICD-10-CM | POA: Diagnosis not present

## 2021-02-07 DIAGNOSIS — M62561 Muscle wasting and atrophy, not elsewhere classified, right lower leg: Secondary | ICD-10-CM | POA: Diagnosis not present

## 2021-02-07 DIAGNOSIS — I63312 Cerebral infarction due to thrombosis of left middle cerebral artery: Secondary | ICD-10-CM | POA: Diagnosis not present

## 2021-02-07 DIAGNOSIS — I69391 Dysphagia following cerebral infarction: Secondary | ICD-10-CM | POA: Diagnosis not present

## 2021-02-07 DIAGNOSIS — I6602 Occlusion and stenosis of left middle cerebral artery: Secondary | ICD-10-CM | POA: Diagnosis not present

## 2021-02-07 DIAGNOSIS — I69351 Hemiplegia and hemiparesis following cerebral infarction affecting right dominant side: Secondary | ICD-10-CM | POA: Diagnosis not present

## 2021-02-07 DIAGNOSIS — I69320 Aphasia following cerebral infarction: Secondary | ICD-10-CM | POA: Diagnosis not present

## 2021-02-07 DIAGNOSIS — R2689 Other abnormalities of gait and mobility: Secondary | ICD-10-CM | POA: Diagnosis not present

## 2021-02-07 DIAGNOSIS — I69390 Apraxia following cerebral infarction: Secondary | ICD-10-CM | POA: Diagnosis not present

## 2021-02-07 DIAGNOSIS — R293 Abnormal posture: Secondary | ICD-10-CM | POA: Diagnosis not present

## 2021-02-08 DIAGNOSIS — I69391 Dysphagia following cerebral infarction: Secondary | ICD-10-CM | POA: Diagnosis not present

## 2021-02-08 DIAGNOSIS — I69320 Aphasia following cerebral infarction: Secondary | ICD-10-CM | POA: Diagnosis not present

## 2021-02-08 DIAGNOSIS — I6602 Occlusion and stenosis of left middle cerebral artery: Secondary | ICD-10-CM | POA: Diagnosis not present

## 2021-02-08 DIAGNOSIS — I69390 Apraxia following cerebral infarction: Secondary | ICD-10-CM | POA: Diagnosis not present

## 2021-02-12 DIAGNOSIS — I69390 Apraxia following cerebral infarction: Secondary | ICD-10-CM | POA: Diagnosis not present

## 2021-02-12 DIAGNOSIS — R2689 Other abnormalities of gait and mobility: Secondary | ICD-10-CM | POA: Diagnosis not present

## 2021-02-12 DIAGNOSIS — M6389 Disorders of muscle in diseases classified elsewhere, multiple sites: Secondary | ICD-10-CM | POA: Diagnosis not present

## 2021-02-12 DIAGNOSIS — I6602 Occlusion and stenosis of left middle cerebral artery: Secondary | ICD-10-CM | POA: Diagnosis not present

## 2021-02-12 DIAGNOSIS — R293 Abnormal posture: Secondary | ICD-10-CM | POA: Diagnosis not present

## 2021-02-12 DIAGNOSIS — I63312 Cerebral infarction due to thrombosis of left middle cerebral artery: Secondary | ICD-10-CM | POA: Diagnosis not present

## 2021-02-12 DIAGNOSIS — M62561 Muscle wasting and atrophy, not elsewhere classified, right lower leg: Secondary | ICD-10-CM | POA: Diagnosis not present

## 2021-02-12 DIAGNOSIS — I69391 Dysphagia following cerebral infarction: Secondary | ICD-10-CM | POA: Diagnosis not present

## 2021-02-12 DIAGNOSIS — I69320 Aphasia following cerebral infarction: Secondary | ICD-10-CM | POA: Diagnosis not present

## 2021-02-12 DIAGNOSIS — R278 Other lack of coordination: Secondary | ICD-10-CM | POA: Diagnosis not present

## 2021-02-12 DIAGNOSIS — I69351 Hemiplegia and hemiparesis following cerebral infarction affecting right dominant side: Secondary | ICD-10-CM | POA: Diagnosis not present

## 2021-02-13 ENCOUNTER — Non-Acute Institutional Stay (SKILLED_NURSING_FACILITY): Payer: Medicare Other | Admitting: Orthopedic Surgery

## 2021-02-13 ENCOUNTER — Encounter: Payer: Self-pay | Admitting: Orthopedic Surgery

## 2021-02-13 DIAGNOSIS — B372 Candidiasis of skin and nail: Secondary | ICD-10-CM

## 2021-02-13 DIAGNOSIS — L22 Diaper dermatitis: Secondary | ICD-10-CM | POA: Diagnosis not present

## 2021-02-13 NOTE — Progress Notes (Signed)
Location:  [facility] Room Number: 116 Place of Service:  SNF [phone]) Provider:  Windell Moulding, AGNP-C  Beverly Li  Patient Care Team: Beverly Li as PCP - General (Internal Medicine) Beverly Pain, Li as PCP - Cardiology (Cardiology) Beverly Sprang, Li as PCP - Electrophysiology (Cardiology) Beverly Gamma, Li as Consulting Physician (Neurosurgery)  Extended Emergency Contact Information Primary Emergency Contact: Beverly Li Address: [place] Litter of [place] Phone: [phone] Mobile Phone: [phone] Relation: Spouse Secondary Emergency Contact: Beverly Li Address: [address]          [place], [place] [address] Johnnette Litter of [place] Mobile Phone: [phone] Relation: Son  Code Status:  Full code Goals of care: Advanced Directive information Advanced Directives 02/05/2021  Does Patient Have a Medical Advance Directive? Yes  Type of Advance Directive Living will  Does patient want to make changes to medical advance directive? No - Patient declined  Copy of [place] in Chart? -  Would patient like information on creating a medical advance directive? -     Chief Complaint  Patient presents with   Acute Visit    Peri-rectal rash    HPI:  Pt is a 75 y.o. female seen today for acute visit due to peri-rectal rash.   She currently resides on the skilled nursing unit at [facility]   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 Second hospitalization from 08/30- 09/08 due to pneumonia/sepsis. Third hospitalization 10/10- 10/11 for progression of left MCA stroke. 10/21 she presented to the ED due to shortness of breath, diagnosed with COPD exacerbation.   Today, nursing reports red rash over peri-rectal area. Rash is starting to spread over rectal area more. She is not on any topical at this time.  She is incontinent to bowel and bladder. Seen by wound care nurse yesterday, advised to notify provider.     Past Medical History:  Diagnosis Date   Anxiety    Arthritis    "some in my lower back; probably elbows, knees" (11/18/2017)   Atrial fibrillation (HCC)    Bell's palsy    when pt. was 75 yrs old, when under stress the left side of face will droop.   Complication of anesthesia    "vascular OR 2016; BP bottomed out; couldn't get it regulated; ended up in ICU for DAYS" (11/18/2017)   GERD (gastroesophageal reflux disease)    History of kidney stones    Hypertension    Hypertrophic cardiomyopathy (HCC)    severe LV basilar hypertrophy witn no evidence of significant outflow tract obstruction, EF 65-70%, mild LAE, mild TR, grade 1a diastolic dysfunction [id number] (Dr. Candee Furbish) (Atrial Septal Hypertrophy pattern)-- Intra-op TEE with dsignificant outflow tract obstruction - AI, MR & TR   Insomnia    Mild aortic sclerosis    Osteopenia    Peripheral vascular disease (HCC)    Syncope    , Vagal   Past Surgical History:  Procedure Laterality Date   AUGMENTATION MAMMAPLASTY Bilateral    BACK SURGERY     CARDIAC CATHETERIZATION N/A 05/07/2016   Procedure: Left Heart Cath and Coronary Angiography;  Surgeon: Burnell Blanks, Li;  Location: [facility];  Service: Cardiovascular;  Laterality: N/A;   CARDIOVERSION N/A 09/24/2017   Procedure: CARDIOVERSION;  Surgeon: Larey Dresser, Li;  Location: [facility];  Service:  Cardiovascular;  Laterality: N/A;   DILATION AND CURETTAGE OF UTERUS     ENDARTERECTOMY FEMORAL Right 03/02/2015   Procedure: ENDARTERECTOMY RIGHT FEMORAL;  Surgeon: Angelia Mould, Li;  Location: [facility];  Service: Vascular;  Laterality: Right;   ESOPHAGOGASTRODUODENOSCOPY (EGD) WITH PROPOFOL N/A 12/01/2020   Procedure: ESOPHAGOGASTRODUODENOSCOPY (EGD) WITH PROPOFOL;  Surgeon: Jesusita Oka, Li;  Location: [facility];  Service: General;  Laterality:  N/A;   FACIAL COSMETIC SURGERY Left 2002   "related to [place] @ age 25; left eye/side of face droopy; tried to make area symmetrical"   FEMORAL-POPLITEAL BYPASS GRAFT Right 03/02/2015   Procedure: BYPASS GRAFT FEMORAL-BELOW KNEE POPLITEAL ARTERY;  Surgeon: Angelia Mould, Li;  Location: [facility];  Service: Vascular;  Laterality: Right;   INGUINAL HERNIA REPAIR Bilateral 2002   IR ANGIO INTRA EXTRACRAN SEL COM CAROTID INNOMINATE UNI L MOD SED  11/16/2020   IR CT HEAD LTD  11/13/2020   IR PERCUTANEOUS ART THROMBECTOMY/INFUSION INTRACRANIAL INC DIAG ANGIO  11/13/2020   OVARIAN CYST REMOVAL Left    PEG PLACEMENT N/A 12/01/2020   Procedure: PERCUTANEOUS ENDOSCOPIC GASTROSTOMY (PEG) PLACEMENT;  Surgeon: Jesusita Oka, Li;  Location: [facility];  Service: General;  Laterality: N/A;   PERIPHERAL VASCULAR CATHETERIZATION N/A 01/16/2015   Procedure: Abdominal Aortogram;  Surgeon: Angelia Mould, Li;  Location: [facility];  Service: Cardiovascular;  Laterality: N/A;   POSTERIOR LUMBAR FUSION  2015   "have plates and screws in there"   [place] N/A 11/13/2020   Procedure: IR WITH ANESTHESIA;  Surgeon: Radiologist, Medication, Li;  Location: [facility];  Service: Radiology;  Laterality: N/A;   TONSILLECTOMY      Allergies  Allergen Reactions   Amoxicillin Other (See Comments)    UTI Has patient had a PCN reaction causing immediate rash, facial/tongue/throat swelling, SOB or lightheadedness with hypotension: No Has patient had a PCN reaction causing severe rash involving mucus membranes or skin necrosis: No Has patient had a PCN reaction that required hospitalization: No Has patient had a PCN reaction occurring within the last 10 years: Yes--UTI ONLY If all of the above answers are "NO", then may proceed with Cephalosporin use.    Atenolol Cough   Crestor [Rosuvastatin Calcium] Other (See Comments)    Muscle aches   Pravastatin Other (See Comments)    Muscle aches    Sulfa Antibiotics Nausea Only   Codeine Other (See Comments)    hallucinations    Outpatient Encounter Medications as of 02/13/2021  Medication Sig   acetaminophen (TYLENOL) 325 MG tablet Take 650 mg by mouth every 4 (four) hours as needed for mild Li.   amiodarone (PACERONE) 200 MG tablet Place 1 tablet (200 mg total) into feeding tube daily.   amLODipine (NORVASC) 5 MG tablet Take 5 mg by mouth daily. In the morning 8-11 am   apixaban (ELIQUIS) 5 MG TABS tablet Place 1 tablet (5 mg total) into feeding tube 2 (two) times daily.   budesonide (PULMICORT) 0.5 MG/2ML nebulizer solution Take 2 mLs (0.5 mg total) by nebulization 2 (two) times daily.   buPROPion (WELLBUTRIN) 75 MG tablet Place 1 tablet (75 mg total) into feeding tube 2 (two) times daily.   ezetimibe (ZETIA) 10 MG tablet Place 1 tablet (10 mg total) into feeding tube daily.   hydrALAZINE (APRESOLINE) 10 MG tablet Take 10 mg by mouth 3 (three) times daily as needed. SBP>165 or DBP >95   ipratropium-albuterol (DUONEB) 0.5-2.5 (3) MG/3ML SOLN Take  3 mLs by nebulization every 4 (four) hours as needed.   metoprolol tartrate (LOPRESSOR) 50 MG tablet Place 1 tablet (50 mg total) into feeding tube 2 (two) times daily.   pantoprazole sodium (PROTONIX) 40 mg/20 mL SUSP Place 40 mg into feeding tube 2 (two) times daily.   Water For Irrigation, Sterile (FREE WATER) SOLN Place 250 mLs into feeding tube 6 (six) times daily.   No facility-administered encounter medications on file as of 02/13/2021.    Review of Systems  Constitutional:  Positive for activity change. Negative for chills, diaphoresis, fatigue and fever.  Respiratory:  Negative for cough, shortness of breath and wheezing.   Cardiovascular:  Negative for chest Li and leg swelling.  Skin:  Positive for rash.  Psychiatric/Behavioral:  Negative for confusion, dysphoric mood and sleep disturbance. The patient is not nervous/anxious.    Immunization History  Administered  Date(s) Administered   Influenza Split 01/16/2009, 04/30/2010, 01/26/2016, 02/07/2018, 02/01/2020   Influenza, High Dose Seasonal PF 02/07/2018, 01/25/2019   Influenza,inj,Quad PF,6+ Mos 03/06/2012, 01/26/2016, 01/10/2017   Influenza,inj,quad, With Preservative 02/14/2015   PFIZER(Purple Top)SARS-COV-2 Vaccination 05/06/2019, 05/27/2019, 01/27/2020, 09/18/2020   Pneumococcal Conjugate-13 03/19/2017   Pneumococcal Polysaccharide-23 02/14/2015   Td 02/25/2005   Tdap 05/19/2013   Zoster, Live 07/28/2006   Pertinent  Health Maintenance Due  Topic Date Due   COLONOSCOPY (Pts 45-25yrs Insurance coverage will need to be confirmed)  Never done   DEXA SCAN  Never done   INFLUENZA VACCINE  11/13/2020   Fall Risk 01/22/2021 01/23/2021 01/23/2021 02/02/2021 02/02/2021  Falls in the past year? - - - - -  Was there an injury with Fall? - - - - -  Fall Risk Category Calculator - - - - -  Fall Risk Category - - - - -  Patient Fall Risk Level High fall risk High fall risk High fall risk High fall risk High fall risk  Patient at Risk for Falls Due to - - - - -  Fall risk Follow up - - - - -   Functional Status Survey:    Vitals:   02/13/21 1237  BP: 122/79  Pulse: 74  Resp: 20  Temp: (!) 97 F (36.1 C)  SpO2: 94%  Weight: 101 lb 9.6 oz (46.1 kg)  Height: 5\' 3"  (1.6 m)   Body mass index is 18 kg/m. Physical Exam Vitals reviewed.  Constitutional:      General: She is not in acute distress. HENT:     Head: Normocephalic.  Cardiovascular:     Rate and Rhythm: Normal rate and regular rhythm.     Pulses: Normal pulses.     Heart sounds: Normal heart sounds. No murmur heard. Pulmonary:     Effort: Pulmonary effort is normal. No respiratory distress.     Breath sounds: Normal breath sounds. No wheezing.  Abdominal:     General: Bowel sounds are normal. There is no distension.     Palpations: Abdomen is soft.     Tenderness: There is no abdominal tenderness.  Skin:    General: Skin  is warm and dry.     Capillary Refill: Capillary refill takes less than 2 seconds.     Findings: Erythema and rash present.     Comments: Demarcated erythema over peri-rectal region, no edema or skin breakdown, appears to be spreading towards vaginal area.   Neurological:     General: No focal deficit present.     Mental Status: She is alert. Mental status is  at baseline.     Motor: Weakness present.     Gait: Gait abnormal.  Psychiatric:        Mood and Affect: Mood normal.        Behavior: Behavior normal.    Labs reviewed: Recent Labs    11/29/20 0128 11/30/20 0826 12/02/20 0216 12/04/20 0052 01/22/21 0658 01/23/21 0259 01/30/21 0000 02/02/21 0519  NA 151*   < > 140   < > 138 135 143 139  K 4.4   < > 4.2   < > 3.9 3.6 4.0 3.5  CL 113*   < > 108   < > 98 94* 99 100  CO2 30   < > 25   < >  --  28 31* 30  GLUCOSE 140*   < > 181*   < > 106* 85  --  114*  BUN 60*   < > 51*   < > 19 14 13  13 21   CREATININE 0.75   < > 0.79   < > 0.90 0.85 0.7  0.7 0.72  CALCIUM 8.9   < > 8.4*   < >  --  8.7* 9.6  9.6 8.9  MG 2.3  --  2.2  --   --  2.0  --   --   PHOS 3.4  --  3.0  --   --  4.1  --   --    < > = values in this interval not displayed.   Recent Labs    12/22/20 0409 01/22/21 0651 01/23/21 0259  AST 35 39 28  ALT 47* 38 34  ALKPHOS 69 68 57  BILITOT 0.6 1.3* 1.5*  PROT 5.6* 6.1* 5.6*  ALBUMIN 2.6* 3.4* 3.1*   Recent Labs    01/15/21 0603 01/22/21 0651 01/22/21 0658 01/23/21 0259 01/30/21 0000 02/02/21 0519  WBC 8.8 10.7*  --  8.9 9.3 9.3  NEUTROABS 5.5 7.7  --   --   --  7.2  HGB 15.3* 15.5*   < > 14.5 15.1 14.4  HCT 47.5* 48.4*   < > 45.8 48* 45.3  MCV 95.2 96.4  --  95.0  --  95.4  PLT 234 237  --  195 261 286   < > = values in this interval not displayed.   Lab Results  Component Value Date   TSH 1.087 11/18/2020   Lab Results  Component Value Date   HGBA1C 6.0 (H) 11/13/2020   Lab Results  Component Value Date   CHOL 209 (H) 11/13/2020   HDL  70 11/13/2020   LDLCALC 120 (H) 11/13/2020   TRIG 97 11/13/2020   CHOLHDL 3.0 11/13/2020    Significant Diagnostic Results in last 30 days:  MR BRAIN WO CONTRAST  Result Date: 01/22/2021 CLINICAL DATA:  Transient ischemic attack EXAM: MRI HEAD WITHOUT CONTRAST TECHNIQUE: Multiplanar, multiecho pulse sequences of the brain and surrounding structures were obtained without intravenous contrast. COMPARISON:  Same day CT/CTA head and neck, MR head 11/14/2020 FINDINGS: Brain: There is diffusion restriction with associated T2/FLAIR signal abnormality in the left ACA and MCA distribution, some of which was present on the prior study from 11/14/2020 and has decreased in conspicuity; however, there is increased diffusion restriction in the left basal ganglia, internal and external capsules, extending to the caudate body/corona radiata. Findings suspicious for acute infarct superimposed on evolving subacute infarcts. Curvilinear SWI signal dropout along the left precentral sulcus may reflect petechial hemorrhage. There is an additional  focus of SWI signal dropout in the left superior frontal gyrus which may reflect additional petechial hemorrhage versus punctate chronic microhemorrhage. Vascular: The right ICA flow void is absent through the communicating segment, in keeping with the findings on the prior CTA neck. Skull and upper cervical spine: Normal marrow signal. Sinuses/Orbits: There is mild mucosal thickening in the paranasal sinuses. The globes and orbits are unremarkable. Other: None. IMPRESSION: 1. New/increased diffusion restriction in the left basal ganglia, internal and external capsules, extending to the caudate body/corona radiata compared to the study from 11/14/2020 consistent with acute to subacute infarct. 2. Additional diffusion restriction in the left MCA and ACA territories consistent with evolving subacute infarct as seen on the study from 11/14/2020. 3. Curvilinear SWI signal dropout along the  left precentral sulcus consistent with petechial hemorrhage. There is an additional focus of petechial hemorrhage versus punctate chronic microhemorrhage in the left superior frontal gyrus. Electronically Signed   By: Valetta Mole M.D.   On: 01/22/2021 10:20   DG Chest Port 1 View  Result Date: 02/02/2021 CLINICAL DATA:  Cerebrovascular accident EXAM: PORTABLE CHEST 1 VIEW COMPARISON:  12/12/2020 FINDINGS: Stable cardiac silhouette. Chronic atelectasis and effusion at the RIGHT lung base similar comparison CT. Slight increase in the RIGHT effusion. No pneumothorax. IMPRESSION: Chronic effusion and atelectasis at the RIGHT lung base. Probable increase in the RIGHT effusion. Electronically Signed   By: Suzy Bouchard M.D.   On: 02/02/2021 05:53   ECHOCARDIOGRAM COMPLETE  Result Date: 01/23/2021    ECHOCARDIOGRAM REPORT   Patient Name:   Beverly Li Date of Exam: 01/23/2021 Medical Rec #:  [id number]       Height:       63.0 in Accession #:    [id number]      Weight:       99.1 lb Date of Birth:  1945/05/15       BSA:          1.435 m Patient Age:    39 years        BP:           158/86 mmHg Patient Gender: F               HR:           80 bpm. Exam Location:  Inpatient Procedure: 2D Echo, Cardiac Doppler and Color Doppler Indications:    Abnormal ECG R94.31  History:        Patient has prior history of Echocardiogram examinations, most                 recent 11/14/2020. CHF, PAD, Arrythmias:Atrial Fibrillation; Risk                 Factors:Sleep Apnea and Hypertension. Hypertrophic                 cardiomyopathy.  Sonographer:    Darlina Sicilian RDCS Referring Phys: [place]  1. Left ventricular ejection fraction, by estimation, is 60%. The left ventricle has normal function. The left ventricle has no regional wall motion abnormalities. There is severe left ventricular hypertrophy. Peak gradient of 2 mm Hg; there is no evidence of LVOT Obstruction in this study. No evidence of  apical aneursysm. Left ventricular diastolic parameters are indeterminate.  2. Right ventricular systolic function is mildly reduced. The right ventricular size is normal. There is normal pulmonary artery systolic pressure.  3. Left atrial size was mildly dilated.  4. Right atrial  size was moderately dilated.  5. The mitral valve is degenerative. Mild mitral valve regurgitation. The mean mitral valve gradient is 4.0 mmHg with average heart rate of 72 bpm.  6. The aortic valve is tricuspid. There is moderate calcification of the aortic valve. Aortic valve regurgitation is mild. Mild to moderate aortic valve sclerosis/calcification is present, without any evidence of aortic stenosis.  7. Aortic dilatation noted. There is mild dilatation of the ascending aorta, measuring 40 mm. Comparison(s): A prior study was performed on 11/14/2020. No significant change from prior study. FINDINGS  Left Ventricle: Left ventricular ejection fraction, by estimation, is 60%. The left ventricle has normal function. The left ventricle has no regional wall motion abnormalities. The left ventricular internal cavity size was small. There is severe left ventricular hypertrophy. Left ventricular diastolic parameters are indeterminate. Right Ventricle: The right ventricular size is normal. Right vetricular wall thickness was not well visualized. Right ventricular systolic function is mildly reduced. There is normal pulmonary artery systolic pressure. The tricuspid regurgitant velocity is 2.62 m/s, and with an assumed right atrial pressure of 8 mmHg, the estimated right ventricular systolic pressure is [id number] mmHg. Left Atrium: Left atrial size was mildly dilated. Right Atrium: Right atrial size was moderately dilated. Pericardium: There is no evidence of pericardial effusion. Mitral Valve: The mitral valve is degenerative in appearance. Mild mitral valve regurgitation. MV peak gradient, 7.9 mmHg. The mean mitral valve gradient is 4.0 mmHg with  average heart rate of 72 bpm. Tricuspid Valve: The tricuspid valve is grossly normal. Tricuspid valve regurgitation is mild . No evidence of tricuspid stenosis. Aortic Valve: The aortic valve is tricuspid. There is moderate calcification of the aortic valve. Aortic valve regurgitation is mild. Aortic regurgitation PHT measures 1128 msec. Mild to moderate aortic valve sclerosis/calcification is present, without any evidence of aortic stenosis. Pulmonic Valve: The pulmonic valve was not well visualized. Pulmonic valve regurgitation is mild to moderate. No evidence of pulmonic stenosis. Aorta: Aortic dilatation noted. There is mild dilatation of the ascending aorta, measuring 40 mm. IAS/Shunts: The atrial septum is grossly normal.  LEFT VENTRICLE PLAX 2D LVIDd:         3.30 cm     Diastology LVIDs:         2.30 cm     LV e' medial:    3.53 cm/s LV PW:         1.30 cm     LV E/e' medial:  27.3 LV IVS:        2.00 cm     LV e' lateral:   5.89 cm/s LVOT diam:     2.00 cm     LV E/e' lateral: 16.4 LV SV:         35 LV SV Index:   25 LVOT Area:     3.14 cm  LV Volumes (MOD) LV vol d, MOD A2C: 69.1 ml LV vol d, MOD A4C: 81.8 ml LV vol s, MOD A2C: 28.1 ml LV vol s, MOD A4C: 35.3 ml LV SV MOD A2C:     41.0 ml LV SV MOD A4C:     81.8 ml LV SV MOD BP:      43.1 ml RIGHT VENTRICLE RV S prime:     8.93 cm/s TAPSE (M-mode): 1.1 cm LEFT ATRIUM             Index        RIGHT ATRIUM           Index LA diam:  4.20 cm 2.93 cm/m   RA Area:     22.10 cm LA Vol (A2C):   43.5 ml 30.32 ml/m  RA Volume:   63.60 ml  44.33 ml/m LA Vol (A4C):   56.0 ml 39.03 ml/m LA Biplane Vol: 53.5 ml 37.29 ml/m  AORTIC VALVE LVOT Vmax:   76.00 cm/s LVOT Vmean:  52.500 cm/s LVOT VTI:    0.112 m AI PHT:      1128 msec  AORTA Ao Root diam: 3.40 cm Ao Asc diam:  3.95 cm MITRAL VALVE               TRICUSPID VALVE MV Area (PHT): 4.65 cm    TR Peak grad:   27.5 mmHg MV Area VTI:   1.52 cm    TR Vmax:        262.00 cm/s MV Peak grad:  7.9 mmHg MV Mean  grad:  4.0 mmHg    SHUNTS MV Vmax:       1.40 m/s    Systemic VTI:  0.11 m MV Vmean:      88.9 cm/s   Systemic Diam: 2.00 cm MV Decel Time: 163 msec MV E velocity: 96.43 cm/s Rudean Haskell Li Electronically signed by Rudean Haskell Li Signature Date/Time: 01/23/2021/10:36:35 AM    Final    CT HEAD CODE STROKE WO CONTRAST  Result Date: 01/22/2021 CLINICAL DATA:  Code stroke. 75 year old female with neurologic deficit. Left ACA and MCA territory infarcts in August. EXAM: CT HEAD WITHOUT CONTRAST TECHNIQUE: Contiguous axial images were obtained from the base of the skull through the vertex without intravenous contrast. COMPARISON:  Brain MRI 11/14/2020, head CT 11/13/2020. FINDINGS: Brain: Hypodensity in the left MCA and ACA territories corresponding to restricted diffusion last month, resembling developing encephalomalacia. No new areas of cytotoxic edema identified. Heterogeneity in the right deep gray matter nuclei and cerebellum compatible with small chronic infarcts appears stable. Additional patchy bilateral white matter hypodensity appears stable to FLAIR signal abnormality last month. No midline shift, ventriculomegaly, mass effect, evidence of mass lesion, intracranial hemorrhage or evidence of cortically based acute infarction. Vascular: Calcified atherosclerosis at the skull base. No new hyperdense vessel identified. Skull: No acute osseous abnormality identified. Sinuses/Orbits: Visualized paranasal sinuses and mastoids are stable and well aerated. Other: Stable visible face, orbit, and scalp soft tissues. ASPECTS [facility] Stroke Program Early CT Score) Total score (0-10 with 10 being normal): 10, when allowing for evolution of left MCA and ACA infarcts since August. IMPRESSION: 1. Expected evolution of the left MCA and ACA territory infarcts since last month. Underlying chronic small vessel disease. 2. No new cortically based infarct or acute intracranial hemorrhage identified. ASPECTS  10. 3. These results were communicated to Drs. Lindzen and Collins at [employer] am on 01/22/2021 by text page via the [employer] system. Electronically Signed   By: Genevie Ann M.D.   On: 01/22/2021 07:13   CT ANGIO HEAD NECK W WO CM W PERF (CODE STROKE)  Result Date: 01/22/2021 CLINICAL DATA:  75 year old female code stroke. Status post left M1 occlusion, left ACA stenosis, ACA and MCA infarcts in August. EXAM: CT ANGIOGRAPHY HEAD AND NECK CT PERFUSION BRAIN TECHNIQUE: Multidetector CT imaging of the head and neck was performed using the standard protocol during bolus administration of intravenous contrast. Multiplanar CT image reconstructions and MIPs were obtained to evaluate the vascular anatomy. Carotid stenosis measurements (when applicable) are obtained utilizing NASCET criteria, using the distal internal carotid diameter as the denominator. Multiphase CT imaging  of the brain was performed following IV bolus contrast injection. Subsequent parametric perfusion maps were calculated using RAPID software. CONTRAST:  100 mL Omnipaque 350 COMPARISON:  Plain head CT 0659 hours today.  Brain MRI 11/14/2020. CTA head and neck 11/13/2020. FINDINGS: CT Brain Perfusion Findings: ASPECTS: 10 (of all vein left MCA and ACA infarcts since August). CBF (<30%) Volume: 0 Perfusion (Tmax>6.0s) volume: 0 Mismatch Volume: Not applicable Infarction Location:Not applicable CTA NECK Skeleton: Stable.  No acute osseous abnormality identified. Upper chest: Stable upper lung septal thickening with mild to moderate centrilobular emphysema. No superior mediastinal lymphadenopathy identified. Other neck: Stable. Aortic arch: Calcified aortic atherosclerosis. Three vessel arch configuration better demonstrated on the August CTA. Right carotid system: Visible brachiocephalic artery and right CCA origin are patent with plaque but no significant stenosis. 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 As before, no right ICA reconstitution to the skull base. Left carotid system: Stable left CCA plaque without stenosis. Bulky calcified plaque at the left ICA origin and bulb is stable with stenosis numerically estimated at 70 % with respect to the distal vessel. The left ICA remains patent to the skull base without additional stenosis. Vertebral arteries: Stable proximal right subclavian and right vertebral artery plaque with moderate stenosis of the non dominant right vertebral artery origin. The right vertebral remains patent to the skull base, diminutive, but with no additional stenosis. Stable proximal left subclavian artery plaque without stenosis. Mild calcified plaque at the left vertebral artery origin without stenosis. Dominant left vertebral with tortuous V1 segment. The left vertebral remains patent to the skull base without stenosis. CTA HEAD Posterior circulation: Non dominant right vertebral artery continues to the vertebrobasilar junction without stenosis. Right AICA appears dominant and patent. Dominant left vertebral V4 segment calcified plaque with mild to moderate stenosis appears stable (series 7, image 2 [id number]). Stenosis is proximal to the normal left PICA origin and vertebrobasilar junction. Patent basilar artery with mild irregularity but no stenosis. Patent SCA and left PCA origins. Fetal type right PCA origin but with patent communication to the right SCA as before. Left posterior communicating is diminutive or absent. Bilateral PCA branches are stable and within normal limits. Anterior circulation: Right ICA terminus reconstituted from the posterior communicating artery as before. No right ICA siphon enhancement. Right MCA and ACA origins remain patent and within normal limits. Left ICA siphon calcified plaque with mild to moderate supraclinoid stenosis is stable. Patent left MCA and ACA origins. Normal anterior communicating artery. ACA A2 segments are patent  without stenosis. Improved appearance of distal left ACA branches since August. No discrete branch occlusion now. Left MCA M1 segment has recanalized and is patent to the trifurcation without stenosis. Some left MCA branches are attenuated concordant with the recent infarction. No M2 branch occlusion is evident. Right MCA M1 segment and bifurcation are patent without stenosis. Right MCA branches appear stable and within normal limits. Venous sinuses: Early contrast timing, grossly patent. Anatomic variants: Dominant left vertebral artery. Fetal type right PCA origin. Review of the MIP images confirms the above findings IMPRESSION: 1. Negative for emergent large vessel occlusion and no infarct core or ischemic penumbra detected by CT Perfusion. 2. 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 3. Recanalized and or improved Left MCA and distal Left ACA branches since August. 4. Stable atherosclerosis notable for: - high-grade proximal Left  ICA stenosis estimated at 70%, due to bulky calcified plaque. - up to moderate bilateral vertebral artery stenosis (dominant left vertebral V4 segment and non dominant right vertebral artery origin). - mild to moderate supraclinoid Left ICA siphon stenosis due to calcified plaque. 5. Aortic Atherosclerosis (ICD10-I70.0). Emphysema (ICD10-J43.9). Salient findings discussed by telephone with Dr. Kerney Elbe on 01/22/2021 at 07:36 . Electronically Signed   By: Genevie Ann M.D.   On: 01/22/2021 07:41    Assessment/Plan 1. Candidal diaper dermatitis - related to moisture, incontinence and prolonged brief use - start zinc oxide 20%- apply to peri-rectal area bid x 14 days, then bid prn - start nystatin 100,000 cream- apply to peri-rectal area bid x 14 days, then bid prn   Family/ staff Communication: plan discussed with patient and nurse  Labs/tests ordered:  none

## 2021-02-19 ENCOUNTER — Non-Acute Institutional Stay (SKILLED_NURSING_FACILITY): Payer: Medicare Other | Admitting: Internal Medicine

## 2021-02-19 ENCOUNTER — Encounter: Payer: Self-pay | Admitting: Internal Medicine

## 2021-02-19 ENCOUNTER — Telehealth: Payer: Self-pay | Admitting: Cardiology

## 2021-02-19 DIAGNOSIS — I480 Paroxysmal atrial fibrillation: Secondary | ICD-10-CM | POA: Diagnosis not present

## 2021-02-19 DIAGNOSIS — E782 Mixed hyperlipidemia: Secondary | ICD-10-CM

## 2021-02-19 DIAGNOSIS — I69359 Hemiplegia and hemiparesis following cerebral infarction affecting unspecified side: Secondary | ICD-10-CM | POA: Diagnosis not present

## 2021-02-19 DIAGNOSIS — I10 Essential (primary) hypertension: Secondary | ICD-10-CM

## 2021-02-19 DIAGNOSIS — J690 Pneumonitis due to inhalation of food and vomit: Secondary | ICD-10-CM | POA: Diagnosis not present

## 2021-02-19 DIAGNOSIS — I69391 Dysphagia following cerebral infarction: Secondary | ICD-10-CM

## 2021-02-19 DIAGNOSIS — K219 Gastro-esophageal reflux disease without esophagitis: Secondary | ICD-10-CM

## 2021-02-19 DIAGNOSIS — J441 Chronic obstructive pulmonary disease with (acute) exacerbation: Secondary | ICD-10-CM | POA: Diagnosis not present

## 2021-02-19 DIAGNOSIS — I421 Obstructive hypertrophic cardiomyopathy: Secondary | ICD-10-CM | POA: Diagnosis not present

## 2021-02-19 DIAGNOSIS — F339 Major depressive disorder, recurrent, unspecified: Secondary | ICD-10-CM

## 2021-02-19 MED ORDER — FUROSEMIDE 20 MG PO TABS: 20.0000 mg | ORAL_TABLET | Freq: Every day | ORAL | 3 refills | Status: DC

## 2021-02-19 NOTE — Telephone Encounter (Signed)
Pt c/o Shortness Of Breath: STAT if SOB developed within the last 24 hours or pt is noticeably SOB on the phone  1. Are you currently SOB (can you hear that pt is SOB on the phone)? Not on the phone with patient  2. How long have you been experiencing SOB? 2 weeks   3. Are you SOB when sitting or when up moving around? both  4. Are you currently experiencing any other symptoms? BP going up and down not consistent  Statistician from [place] states the patient has been having SOB and fluctuating BP. She states they have been having to go up and down with the patient's oxygen. She says she is not sure what is causing it, because the patient will have SOB even while laying down. She also states they have placed the patient on another BP medication, but at times it is still very high. Phone: [phone]

## 2021-02-19 NOTE — Progress Notes (Signed)
Location:   [facility] Room Number: 116 Place of Service:  SNF [phone]) Provider:  Einar Crow MD  [name], MD  Patient Care Team: [name], MD as PCP - General (Internal Medicine) [name], MD as PCP - Cardiology (Cardiology) Duke Salvia, MD as PCP - Electrophysiology (Cardiology) Shirlean Kelly, MD as Consulting Physician (Neurosurgery)  Extended Emergency Contact Information Primary Emergency Contact: Zarazua,[name] Address: [address]          [place] [phone] [name] Home Phone: [phone] Mobile Phone: [phone] Relation: Spouse Secondary Emergency Contact: Shives,[name] Address: [address]          [place], [place] [address] [name] Mobile Phone: [phone] Relation: Son  Code Status:  Full Code Goals of care: Advanced Directive information Advanced Directives 02/19/2021  Does Patient Have a Medical Advance Directive? Yes  Type of Advance Directive Living will  Does patient want to make changes to medical advance directive? No - Patient declined  Copy of Healthcare Power of Attorney in Chart? -  Would patient like information on creating a medical advance directive? -     Chief Complaint  Patient presents with   Acute Visit    HPI:  Pt is a 75 y.o. 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 Patient was off her Eliquis at that time due to  Hematoma. She had thrombectomy performed by IR. Also had PEG tube placed for her severe Dysphagia  Patient was send to the hospital in for worsening Symptoms of her speech and Right sided weakness on 01/22/21 MRI showed above new findings No New Recommendation by Neurology Continue Eliquis for now. Cardiology did Echo which was no change from before . 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 Gets tired very easily Continues to be one Person assist for transfers and Walks with her Hemiwalker with assist  Per nurses struggling with her mood and has been seen depressed and Anxious  Past Medical History:  Diagnosis Date   Anxiety    Arthritis    "some in my lower back; probably elbows, knees" (11/18/2017)   Atrial fibrillation (HCC)    Bell's palsy    when pt. was 75 yrs old, when under stress the left side of face will droop.   Complication of anesthesia    "vascular OR 2016; BP bottomed out; couldn't get it regulated; ended up in ICU for DAYS" (11/18/2017)  GERD (gastroesophageal reflux disease)    History of kidney stones    Hypertension    Hypertrophic cardiomyopathy (HCC)    severe LV basilar hypertrophy witn no evidence of significant outflow tract obstruction, EF 65-70%, mild LAE, mild TR, grade 1a diastolic dysfunction 05/15/10 (Dr. Donato SchultzMark Skains) (Atrial Septal Hypertrophy pattern)-- Intra-op TEE with dsignificant outflow tract obstruction - AI, MR & TR   Insomnia    Mild aortic sclerosis    Osteopenia    Peripheral vascular disease (HCC)    Syncope    , Vagal    Past Surgical History:  Procedure Laterality Date   AUGMENTATION MAMMAPLASTY Bilateral    BACK SURGERY     CARDIAC CATHETERIZATION N/A 05/07/2016   Procedure: Left Heart Cath and Coronary Angiography;  Surgeon: Kathleene Hazelhristopher D McAlhany, MD;  [facility];  Service: Cardiovascular;  Laterality: N/A;   CARDIOVERSION N/A 09/24/2017   Procedure: CARDIOVERSION;  Surgeon: Laurey MoraleMcLean, Dalton S, MD;  [facility];  Service: Cardiovascular;  Laterality: N/A;   DILATION AND CURETTAGE OF UTERUS     ENDARTERECTOMY FEMORAL Right 03/02/2015   Procedure: ENDARTERECTOMY RIGHT FEMORAL;  Surgeon: Chuck Hinthristopher S Dickson, MD;  [facility];  Service: Vascular;  Laterality: Right;   ESOPHAGOGASTRODUODENOSCOPY (EGD) WITH PROPOFOL N/A 12/01/2020   Procedure: ESOPHAGOGASTRODUODENOSCOPY (EGD) WITH PROPOFOL;  Surgeon: Diamantina MonksLovick, Ayesha N, MD;  [facility]: [facility];  Service: General;  Laterality: N/A;   FACIAL COSMETIC SURGERY Left 2002   "related to Bell's Palsy @ age 75; left eye/side of face droopy; tried to make area symmetrical"   FEMORAL-POPLITEAL BYPASS GRAFT Right 03/02/2015   Procedure: BYPASS GRAFT FEMORAL-BELOW KNEE POPLITEAL ARTERY;  Surgeon: Chuck Hinthristopher S Dickson, MD;  Location: [facility] OR;  Service: Vascular;  Laterality: Right;   INGUINAL HERNIA REPAIR Bilateral 2002   IR ANGIO INTRA EXTRACRAN SEL COM CAROTID INNOMINATE UNI L MOD SED  11/16/2020   IR CT HEAD LTD  11/13/2020   IR PERCUTANEOUS ART THROMBECTOMY/INFUSION INTRACRANIAL INC DIAG ANGIO  11/13/2020   OVARIAN CYST REMOVAL Left    PEG PLACEMENT N/A 12/01/2020   Procedure: PERCUTANEOUS ENDOSCOPIC GASTROSTOMY (PEG) PLACEMENT;  Surgeon: Diamantina MonksLovick, Ayesha N, MD;  Location: [facility];  Service: General;  Laterality: N/A;   PERIPHERAL VASCULAR CATHETERIZATION N/A 01/16/2015   Procedure: Abdominal Aortogram;  Surgeon: Chuck Hinthristopher S Dickson, MD;  [facility];  Service: Cardiovascular;  Laterality: N/A;   POSTERIOR LUMBAR FUSION   2015   "have plates and screws in there"   RADIOLOGY WITH ANESTHESIA N/A 11/13/2020   Procedure: IR WITH ANESTHESIA;  Surgeon: Radiologist, Medication, MD;  Location: [facility] OR;  Service: Radiology;  Laterality: N/A;   TONSILLECTOMY      Allergies  Allergen Reactions   Amoxicillin Other (See Comments)    UTI Has patient had a PCN reaction causing immediate rash, facial/tongue/throat swelling, SOB or lightheadedness with hypotension: No Has patient had a PCN reaction causing severe rash involving mucus membranes or skin necrosis: No Has patient had a PCN reaction that required hospitalization: No Has patient had a PCN reaction occurring within the last 10 years: Yes--UTI ONLY If all of the above answers are "NO", then may proceed with Cephalosporin use.    Atenolol Cough   Crestor [Rosuvastatin Calcium] Other (See Comments)    Muscle aches   Pravastatin Other (See Comments)    Muscle aches   Sulfa Antibiotics Nausea Only   Codeine Other (See Comments)    hallucinations    Allergies as of 02/19/2021  Reactions   Amoxicillin Other (See Comments)   UTI Has patient had a PCN reaction causing immediate rash, facial/tongue/throat swelling, SOB or lightheadedness with hypotension: No Has patient had a PCN reaction causing severe rash involving mucus membranes or skin necrosis: No Has patient had a PCN reaction that required hospitalization: No Has patient had a PCN reaction occurring within the last 10 years: Yes--UTI ONLY If all of the above answers are "NO", then may proceed with Cephalosporin use.   Atenolol Cough   Crestor [rosuvastatin Calcium] Other (See Comments)   Muscle aches   Pravastatin Other (See Comments)   Muscle aches   Sulfa Antibiotics Nausea Only   Codeine Other (See Comments)   hallucinations        Medication List        Accurate as of February 19, 2021 10:18 AM. If you have any questions, ask your nurse or doctor.          acetaminophen 325 MG  tablet Commonly known as: TYLENOL Take 650 mg by mouth every 4 (four) hours as needed for mild pain.   amiodarone 200 MG tablet Commonly known as: PACERONE Place 1 tablet (200 mg total) into feeding tube daily.   amLODipine 5 MG tablet Commonly known as: NORVASC Take 5 mg by mouth daily. In the morning 8-11 am   apixaban 5 MG Tabs tablet Commonly known as: ELIQUIS Place 1 tablet (5 mg total) into feeding tube 2 (two) times daily.   budesonide 0.5 MG/2ML nebulizer solution Commonly known as: PULMICORT Take 2 mLs (0.5 mg total) by nebulization 2 (two) times daily.   buPROPion 75 MG tablet Commonly known as: WELLBUTRIN Place 1 tablet (75 mg total) into feeding tube 2 (two) times daily.   ezetimibe 10 MG tablet Commonly known as: ZETIA Place 1 tablet (10 mg total) into feeding tube daily.   free water Soln Place 250 mLs into feeding tube 6 (six) times daily.   hydrALAZINE 10 MG tablet Commonly known as: APRESOLINE Take 10 mg by mouth 3 (three) times daily as needed. SBP>165 or DBP >95   ipratropium-albuterol 0.5-2.5 (3) MG/3ML Soln Commonly known as: DUONEB Take 3 mLs by nebulization every 4 (four) hours as needed.   melatonin 5 MG Tabs Take 5 mg by mouth at bedtime.   metoprolol tartrate 50 MG tablet Commonly known as: LOPRESSOR Place 1 tablet (50 mg total) into feeding tube 2 (two) times daily.   nystatin cream Commonly known as: MYCOSTATIN Apply 1 application topically 2 (two) times daily.   pantoprazole sodium 40 mg/20 mL Susp Commonly known as: PROTONIX Place 40 mg into feeding tube 2 (two) times daily.        Review of Systems  Constitutional:  Positive for activity change.  HENT: Negative.    Respiratory:  Positive for cough and shortness of breath.   Cardiovascular: Negative.   Gastrointestinal: Negative.   Genitourinary: Negative.   Musculoskeletal:  Positive for gait problem.  Skin: Negative.   Neurological:  Negative for dizziness.   Psychiatric/Behavioral:  Positive for dysphoric mood.   very hard to get due to her Aphasia and word Confusion  Immunization History  Administered Date(s) Administered   Influenza Split 01/16/2009, 04/30/2010, 01/26/2016, 02/07/2018, 02/01/2020   Influenza, High Dose Seasonal PF 02/07/2018, 01/25/2019   Influenza,inj,Quad PF,6+ Mos 03/06/2012, 01/26/2016, 01/10/2017   Influenza,inj,quad, With Preservative 02/14/2015   PFIZER(Purple Top)SARS-COV-2 Vaccination 05/06/2019, 05/27/2019, 01/27/2020, 09/18/2020   Pneumococcal Conjugate-13 03/19/2017   Pneumococcal Polysaccharide-23 02/14/2015   Td  02/25/2005   Tdap 05/19/2013   Zoster, Live 07/28/2006   Pertinent  Health Maintenance Due  Topic Date Due   DEXA SCAN  Never done   INFLUENZA VACCINE  11/13/2020   COLONOSCOPY (Pts 45-7yrs Insurance coverage will need to be confirmed)  02/13/2022 (Originally 04/24/1990)   Fall Risk 01/22/2021 01/23/2021 01/23/2021 02/02/2021 02/02/2021  Falls in the past year? - - - - -  Was there an injury with Fall? - - - - -  Fall Risk Category Calculator - - - - -  Fall Risk Category - - - - -  Patient Fall Risk Level High fall risk High fall risk High fall risk High fall risk High fall risk  Patient at Risk for Falls Due to - - - - -  Fall risk Follow up - - - - -   Functional Status Survey:    Vitals:   02/19/21 1006  BP: (!) 154/85  Pulse: 77  Resp: (!) 22  Temp: (!) 97 F (36.1 C)  SpO2: 92%  Weight: 101 lb 9.6 oz (46.1 kg)  Height: 5\' 3"  (1.6 m)   Body mass index is 18 kg/m. Physical Exam Vitals reviewed.  Constitutional:      Appearance: Normal appearance.  HENT:     Head: Normocephalic.     Nose: Nose normal.     Mouth/Throat:     Mouth: Mucous membranes are moist.     Pharynx: Oropharynx is clear.  Eyes:     Pupils: Pupils are equal, round, and reactive to light.  Cardiovascular:     Rate and Rhythm: Normal rate and regular rhythm.     Pulses: Normal pulses.     Heart  sounds: Murmur heard.  Pulmonary:     Effort: Pulmonary effort is normal. No respiratory distress.     Breath sounds: Normal breath sounds. No wheezing.  Abdominal:     General: Abdomen is flat. Bowel sounds are normal.     Palpations: Abdomen is soft.  Musculoskeletal:        General: No swelling.     Cervical back: Neck supple.  Skin:    General: Skin is warm.  Neurological:     Mental Status: She is alert.     Comments: Right Hemiparesis  Psychiatric:        Mood and Affect: Mood normal.        Thought Content: Thought content normal.    Labs reviewed: Recent Labs    11/29/20 0128 11/30/20 0826 12/02/20 0216 12/04/20 0052 01/22/21 [id number] 01/23/21 0259 01/30/21 0000 02/02/21 0519  NA 151*   < > 140   < > 138 135 143 139  K 4.4   < > 4.2   < > 3.9 3.6 4.0 3.5  CL 113*   < > 108   < > 98 94* 99 100  CO2 30   < > 25   < >  --  28 31* 30  GLUCOSE 140*   < > 181*   < > 106* 85  --  114*  BUN 60*   < > 51*   < > 19 14 13  13 21   CREATININE 0.75   < > 0.79   < > 0.90 0.85 0.7  0.7 0.72  CALCIUM 8.9   < > 8.4*   < >  --  8.7* 9.6  9.6 8.9  MG 2.3  --  2.2  --   --  2.0  --   --  PHOS 3.4  --  3.0  --   --  4.1  --   --    < > = values in this interval not displayed.   Recent Labs    12/22/20 0409 01/22/21 0651 01/23/21 0259  AST 35 39 28  ALT 47* 38 34  ALKPHOS 69 68 57  BILITOT 0.6 1.3* 1.5*  PROT 5.6* 6.1* 5.6*  ALBUMIN 2.6* 3.4* 3.1*   Recent Labs    01/15/21 0603 01/22/21 0651 01/22/21 0658 01/23/21 0259 01/30/21 0000 02/02/21 0519  WBC 8.8 10.7*  --  8.9 9.3 9.3  NEUTROABS 5.5 7.7  --   --   --  7.2  HGB 15.3* 15.5*   < > 14.5 15.1 14.4  HCT 47.5* 48.4*   < > 45.8 48* 45.3  MCV 95.2 96.4  --  95.0  --  95.4  PLT 234 237  --  195 261 286   < > = values in this interval not displayed.   Lab Results  Component Value Date   TSH 1.087 11/18/2020   Lab Results  Component Value Date   HGBA1C 6.0 (H) 11/13/2020   Lab Results  Component Value  Date   CHOL 209 (H) 11/13/2020   HDL 70 11/13/2020   LDLCALC 120 (H) 11/13/2020   TRIG 97 11/13/2020   CHOLHDL 3.0 11/13/2020    Significant Diagnostic Results in last 30 days:  MR BRAIN WO CONTRAST  Result Date: 01/22/2021 CLINICAL DATA:  Transient ischemic attack EXAM: MRI HEAD WITHOUT CONTRAST TECHNIQUE: Multiplanar, multiecho pulse sequences of the brain and surrounding structures were obtained without intravenous contrast. COMPARISON:  Same day CT/CTA head and neck, MR head 11/14/2020 FINDINGS: Brain: There is diffusion restriction with associated T2/FLAIR signal abnormality in the left ACA and MCA distribution, some of which was present on the prior study from 11/14/2020 and has decreased in conspicuity; however, there is increased diffusion restriction in the left basal ganglia, internal and external capsules, extending to the caudate body/corona radiata. Findings suspicious for acute infarct superimposed on evolving subacute infarcts. Curvilinear SWI signal dropout along the left precentral sulcus may reflect petechial hemorrhage. There is an additional focus of SWI signal dropout in the left superior frontal gyrus which may reflect additional petechial hemorrhage versus punctate chronic microhemorrhage. Vascular: The right ICA flow void is absent through the communicating segment, in keeping with the findings on the prior CTA neck. Skull and upper cervical spine: Normal marrow signal. Sinuses/Orbits: There is mild mucosal thickening in the paranasal sinuses. The globes and orbits are unremarkable. Other: None. IMPRESSION: 1. New/increased diffusion restriction in the left basal ganglia, internal and external capsules, extending to the caudate body/corona radiata compared to the study from 11/14/2020 consistent with acute to subacute infarct. 2. Additional diffusion restriction in the left MCA and ACA territories consistent with evolving subacute infarct as seen on the study from 11/14/2020. 3.  Curvilinear SWI signal dropout along the left precentral sulcus consistent with petechial hemorrhage. There is an additional focus of petechial hemorrhage versus punctate chronic microhemorrhage in the left superior frontal gyrus. Electronically Signed   By: Valetta Mole M.D.   On: 01/22/2021 10:20   DG Chest Port 1 View  Result Date: 02/02/2021 CLINICAL DATA:  Cerebrovascular accident EXAM: PORTABLE CHEST 1 VIEW COMPARISON:  12/12/2020 FINDINGS: Stable cardiac silhouette. Chronic atelectasis and effusion at the RIGHT lung base similar comparison CT. Slight increase in the RIGHT effusion. No pneumothorax. IMPRESSION: Chronic effusion and atelectasis at the RIGHT  lung base. Probable increase in the RIGHT effusion. Electronically Signed   By: Suzy Bouchard M.D.   On: 02/02/2021 05:53   ECHOCARDIOGRAM COMPLETE  Result Date: 01/23/2021    ECHOCARDIOGRAM REPORT   Patient Name:   Beverly Li Papadakis Date of Exam: 01/23/2021 Medical Rec #:  [id number]       Height:       63.0 in Accession #:    [id number]      Weight:       99.1 lb Date of Birth:  1946-01-11       BSA:          1.435 m Patient Age:    12 years        BP:           158/86 mmHg Patient Gender: F               HR:           80 bpm. Exam Location:  Inpatient Procedure: 2D Echo, Cardiac Doppler and Color Doppler Indications:    Abnormal ECG R94.31  History:        Patient has prior history of Echocardiogram examinations, most                 recent 11/14/2020. CHF, PAD, Arrythmias:Atrial Fibrillation; Risk                 Factors:Sleep Apnea and Hypertension. Hypertrophic                 cardiomyopathy.  Sonographer:    Darlina Sicilian RDCS Referring Phys: [place]  1. Left ventricular ejection fraction, by estimation, is 60%. The left ventricle has normal function. The left ventricle has no regional wall motion abnormalities. There is severe left ventricular hypertrophy. Peak gradient of 2 mm Hg; there is no evidence of LVOT  Obstruction in this study. No evidence of apical aneursysm. Left ventricular diastolic parameters are indeterminate.  2. Right ventricular systolic function is mildly reduced. The right ventricular size is normal. There is normal pulmonary artery systolic pressure.  3. Left atrial size was mildly dilated.  4. Right atrial size was moderately dilated.  5. The mitral valve is degenerative. Mild mitral valve regurgitation. The mean mitral valve gradient is 4.0 mmHg with average heart rate of 72 bpm.  6. The aortic valve is tricuspid. There is moderate calcification of the aortic valve. Aortic valve regurgitation is mild. Mild to moderate aortic valve sclerosis/calcification is present, without any evidence of aortic stenosis.  7. Aortic dilatation noted. There is mild dilatation of the ascending aorta, measuring 40 mm. Comparison(s): A prior study was performed on 11/14/2020. No significant change from prior study. FINDINGS  Left Ventricle: Left ventricular ejection fraction, by estimation, is 60%. The left ventricle has normal function. The left ventricle has no regional wall motion abnormalities. The left ventricular internal cavity size was small. There is severe left ventricular hypertrophy. Left ventricular diastolic parameters are indeterminate. Right Ventricle: The right ventricular size is normal. Right vetricular wall thickness was not well visualized. Right ventricular systolic function is mildly reduced. There is normal pulmonary artery systolic pressure. The tricuspid regurgitant velocity is 2.62 m/s, and with an assumed right atrial pressure of 8 mmHg, the estimated right ventricular systolic pressure is [id number] mmHg. Left Atrium: Left atrial size was mildly dilated. Right Atrium: Right atrial size was moderately dilated. Pericardium: There is no evidence of pericardial effusion. Mitral Valve: The mitral valve is  degenerative in appearance. Mild mitral valve regurgitation. MV peak gradient, 7.9 mmHg. The mean  mitral valve gradient is 4.0 mmHg with average heart rate of 72 bpm. Tricuspid Valve: The tricuspid valve is grossly normal. Tricuspid valve regurgitation is mild . No evidence of tricuspid stenosis. Aortic Valve: The aortic valve is tricuspid. There is moderate calcification of the aortic valve. Aortic valve regurgitation is mild. Aortic regurgitation PHT measures 1128 msec. Mild to moderate aortic valve sclerosis/calcification is present, without any evidence of aortic stenosis. Pulmonic Valve: The pulmonic valve was not well visualized. Pulmonic valve regurgitation is mild to moderate. No evidence of pulmonic stenosis. Aorta: Aortic dilatation noted. There is mild dilatation of the ascending aorta, measuring 40 mm. IAS/Shunts: The atrial septum is grossly normal.  LEFT VENTRICLE PLAX 2D LVIDd:         3.30 cm     Diastology LVIDs:         2.30 cm     LV e' medial:    3.53 cm/s LV PW:         1.30 cm     LV E/e' medial:  27.3 LV IVS:        2.00 cm     LV e' lateral:   5.89 cm/s LVOT diam:     2.00 cm     LV E/e' lateral: 16.4 LV SV:         35 LV SV Index:   25 LVOT Area:     3.14 cm  LV Volumes (MOD) LV vol d, MOD A2C: 69.1 ml LV vol d, MOD A4C: 81.8 ml LV vol s, MOD A2C: 28.1 ml LV vol s, MOD A4C: 35.3 ml LV SV MOD A2C:     41.0 ml LV SV MOD A4C:     81.8 ml LV SV MOD BP:      43.1 ml RIGHT VENTRICLE RV S prime:     8.93 cm/s TAPSE (M-mode): 1.1 cm LEFT ATRIUM             Index        RIGHT ATRIUM           Index LA diam:        4.20 cm 2.93 cm/m   RA Area:     22.10 cm LA Vol (A2C):   43.5 ml 30.32 ml/m  RA Volume:   63.60 ml  44.33 ml/m LA Vol (A4C):   56.0 ml 39.03 ml/m LA Biplane Vol: 53.5 ml 37.29 ml/m  AORTIC VALVE LVOT Vmax:   76.00 cm/s LVOT Vmean:  52.500 cm/s LVOT VTI:    0.112 m AI PHT:      1128 msec  AORTA Ao Root diam: 3.40 cm Ao Asc diam:  3.95 cm MITRAL VALVE               TRICUSPID VALVE MV Area (PHT): 4.65 cm    TR Peak grad:   27.5 mmHg MV Area VTI:   1.52 cm    TR Vmax:         262.00 cm/s MV Peak grad:  7.9 mmHg MV Mean grad:  4.0 mmHg    SHUNTS MV Vmax:       1.40 m/s    Systemic VTI:  0.11 m MV Vmean:      88.9 cm/s   Systemic Diam: 2.00 cm MV Decel Time: 163 msec MV E velocity: 96.43 cm/s Rudean Haskell MD Electronically signed by Rudean Haskell MD Signature Date/Time: 01/23/2021/10:36:35 AM  Final    CT HEAD CODE STROKE WO CONTRAST  Result Date: 01/22/2021 CLINICAL DATA:  Code stroke. 75 year old female with neurologic deficit. Left ACA and MCA territory infarcts in August. EXAM: CT HEAD WITHOUT CONTRAST TECHNIQUE: Contiguous axial images were obtained from the base of the skull through the vertex without intravenous contrast. COMPARISON:  Brain MRI 11/14/2020, head CT 11/13/2020. FINDINGS: Brain: Hypodensity in the left MCA and ACA territories corresponding to restricted diffusion last month, resembling developing encephalomalacia. No new areas of cytotoxic edema identified. Heterogeneity in the right deep gray matter nuclei and cerebellum compatible with small chronic infarcts appears stable. Additional patchy bilateral white matter hypodensity appears stable to FLAIR signal abnormality last month. No midline shift, ventriculomegaly, mass effect, evidence of mass lesion, intracranial hemorrhage or evidence of cortically based acute infarction. Vascular: Calcified atherosclerosis at the skull base. No new hyperdense vessel identified. Skull: No acute osseous abnormality identified. Sinuses/Orbits: Visualized paranasal sinuses and mastoids are stable and well aerated. Other: Stable visible face, orbit, and scalp soft tissues. ASPECTS [facility] Stroke Program Early CT Score) Total score (0-10 with 10 being normal): 10, when allowing for evolution of left MCA and ACA infarcts since August. IMPRESSION: 1. Expected evolution of the left MCA and ACA territory infarcts since last month. Underlying chronic small vessel disease. 2. No new cortically based infarct or acute  intracranial hemorrhage identified. ASPECTS 10. 3. These results were communicated to Drs. Lindzen and Collins at [employer] am on 01/22/2021 by text page via the [employer] system. Electronically Signed   By: Genevie Ann M.D.   On: 01/22/2021 07:13   CT ANGIO HEAD NECK W WO CM W PERF (CODE STROKE)  Result Date: 01/22/2021 CLINICAL DATA:  75 year old female code stroke. Status post left M1 occlusion, left ACA stenosis, ACA and MCA infarcts in August. EXAM: CT ANGIOGRAPHY HEAD AND NECK CT PERFUSION BRAIN TECHNIQUE: Multidetector CT imaging of the head and neck was performed using the standard protocol during bolus administration of intravenous contrast. Multiplanar CT image reconstructions and MIPs were obtained to evaluate the vascular anatomy. Carotid stenosis measurements (when applicable) are obtained utilizing NASCET criteria, using the distal internal carotid diameter as the denominator. Multiphase CT imaging of the brain was performed following IV bolus contrast injection. Subsequent parametric perfusion maps were calculated using RAPID software. CONTRAST:  100 mL Omnipaque 350 COMPARISON:  Plain head CT 0659 hours today.  Brain MRI 11/14/2020. CTA head and neck 11/13/2020. FINDINGS: CT Brain Perfusion Findings: ASPECTS: 10 (of all vein left MCA and ACA infarcts since August). CBF (<30%) Volume: 0 Perfusion (Tmax>6.0s) volume: 0 Mismatch Volume: Not applicable Infarction Location:Not applicable CTA NECK Skeleton: Stable.  No acute osseous abnormality identified. Upper chest: Stable upper lung septal thickening with mild to moderate centrilobular emphysema. No superior mediastinal lymphadenopathy identified. Other neck: Stable. Aortic arch: Calcified aortic atherosclerosis. Three vessel arch configuration better demonstrated on the August CTA. Right carotid system: Visible brachiocephalic artery and right CCA origin are patent with plaque but no significant stenosis. 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 As before, no right ICA reconstitution to the skull base. Left carotid system: Stable left CCA plaque without stenosis. Bulky calcified plaque at the left ICA origin and bulb is stable with stenosis numerically estimated at 70 % with respect to the distal vessel. The left ICA remains patent to the skull base without additional stenosis. Vertebral arteries: Stable proximal right subclavian and right  vertebral artery plaque with moderate stenosis of the non dominant right vertebral artery origin. The right vertebral remains patent to the skull base, diminutive, but with no additional stenosis. Stable proximal left subclavian artery plaque without stenosis. Mild calcified plaque at the left vertebral artery origin without stenosis. Dominant left vertebral with tortuous V1 segment. The left vertebral remains patent to the skull base without stenosis. CTA HEAD Posterior circulation: Non dominant right vertebral artery continues to the vertebrobasilar junction without stenosis. Right AICA appears dominant and patent. Dominant left vertebral V4 segment calcified plaque with mild to moderate stenosis appears stable (series 7, image 2 95). Stenosis is proximal to the normal left PICA origin and vertebrobasilar junction. Patent basilar artery with mild irregularity but no stenosis. Patent SCA and left PCA origins. Fetal type right PCA origin but with patent communication to the right SCA as before. Left posterior communicating is diminutive or absent. Bilateral PCA branches are stable and within normal limits. Anterior circulation: Right ICA terminus reconstituted from the posterior communicating artery as before. No right ICA siphon enhancement. Right MCA and ACA origins remain patent and within normal limits. Left ICA siphon calcified plaque with mild to moderate supraclinoid stenosis is stable. Patent left MCA and ACA origins. Normal anterior communicating  artery. ACA A2 segments are patent without stenosis. Improved appearance of distal left ACA branches since August. No discrete branch occlusion now. Left MCA M1 segment has recanalized and is patent to the trifurcation without stenosis. Some left MCA branches are attenuated concordant with the recent infarction. No M2 branch occlusion is evident. Right MCA M1 segment and bifurcation are patent without stenosis. Right MCA branches appear stable and within normal limits. Venous sinuses: Early contrast timing, grossly patent. Anatomic variants: Dominant left vertebral artery. Fetal type right PCA origin. Review of the MIP images confirms the above findings IMPRESSION: 1. Negative for emergent large vessel occlusion and no infarct core or ischemic penumbra detected by CT Perfusion. 2. 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 3. Recanalized and or improved Left MCA and distal Left ACA branches since August. 4. Stable atherosclerosis notable for: - high-grade proximal Left ICA stenosis estimated at 70%, due to bulky calcified plaque. - up to moderate bilateral vertebral artery stenosis (dominant left vertebral V4 segment and non dominant right vertebral artery origin). - mild to moderate supraclinoid Left ICA siphon stenosis due to calcified plaque. 5. Aortic Atherosclerosis (ICD10-I70.0). Emphysema (ICD10-J43.9). Salient findings discussed by telephone with Dr. Kerney Elbe on 01/22/2021 at 07:36 . Electronically Signed   By: Genevie Ann M.D.   On: 01/22/2021 07:41    Assessment/Plan COPD exacerbation (HCC) Doing well Continue Duo Nebs and Pulmicort Also now on Oxygen  Has diagnosed in 2020 Follows with Dr Lamonte Sakai If her symptoms did not improve will need follow up with him Aspiration pneumonia of right middle lobe, unspecified aspiration pneumonia type [facility]) Was treated with Antibiotics  Continues to have Infilterate in  Right Lung mid and lower field Clinically looks Stable Repeat Chest xray in 8 weeks If no improvement ? 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

## 2021-02-19 NOTE — Telephone Encounter (Signed)
Spoke with Selena Batten and gave verbal order per Dr Anne Fu to start Furosemide 20 mg daily and continue to monitor.  Cody verbalized understanding and will c/b if pt has any further needs.

## 2021-02-19 NOTE — Telephone Encounter (Signed)
Since she is edematous, lets try a gentle diuretic, Lasix 20 mg daily.  Continue to monitor. Donato Schultz, MD

## 2021-02-19 NOTE — Telephone Encounter (Signed)
Called back and spoke with Selena Batten who is reporting pt has been having SOB for 2 weeks, even at rest.  Denies any wt gain but reports edema in lower extremities bilaterally.  Urine outpt is good.  She is not on a diuretic.  BP remain up and down frequently.  Will have Dr Anne Fu to review.

## 2021-02-20 NOTE — Telephone Encounter (Signed)
Kristen from [facility] calling to find out if the patient needs to schedule an appointment. Phone: [phone], she says she leaves around 3:30 pm but Selena Batten will be there.

## 2021-02-20 NOTE — Telephone Encounter (Signed)
Spoke with Baxter Hire at [facility] and advised pt is not scheduled for hospital f/u with Dr Anne Fu.  Appointment scheduled for 02/26/2021 at 220pm.  Kristen verbalizes understanding and thanked Charity fundraiser for the callback.

## 2021-02-26 ENCOUNTER — Other Ambulatory Visit: Payer: Self-pay

## 2021-02-26 ENCOUNTER — Ambulatory Visit: Payer: Medicare Other | Admitting: Cardiology

## 2021-02-26 ENCOUNTER — Encounter: Payer: Self-pay | Admitting: Cardiology

## 2021-02-26 DIAGNOSIS — I63512 Cerebral infarction due to unspecified occlusion or stenosis of left middle cerebral artery: Secondary | ICD-10-CM

## 2021-02-26 DIAGNOSIS — I2510 Atherosclerotic heart disease of native coronary artery without angina pectoris: Secondary | ICD-10-CM

## 2021-02-26 DIAGNOSIS — I421 Obstructive hypertrophic cardiomyopathy: Secondary | ICD-10-CM | POA: Diagnosis not present

## 2021-02-26 DIAGNOSIS — I480 Paroxysmal atrial fibrillation: Secondary | ICD-10-CM | POA: Diagnosis not present

## 2021-02-26 DIAGNOSIS — Z95828 Presence of other vascular implants and grafts: Secondary | ICD-10-CM | POA: Diagnosis not present

## 2021-02-26 DIAGNOSIS — I10 Essential (primary) hypertension: Secondary | ICD-10-CM | POA: Diagnosis not present

## 2021-02-26 NOTE — Assessment & Plan Note (Signed)
Much better control with amlodipine 5 mg a day.  Continue to monitor.  Lasix seem to help as well but now we are going to changes to as needed.  No lower extremity edema currently.

## 2021-02-26 NOTE — Assessment & Plan Note (Signed)
Currently in wheelchair.  Able to communicate and speak in short statements.  Prior aspiration pneumonia.

## 2021-02-26 NOTE — Assessment & Plan Note (Signed)
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

## 2021-02-26 NOTE — Assessment & Plan Note (Signed)
Currently on amiodarone 200 mg a day.  Continue to monitor thyroid liver lung function.  Currently stable.

## 2021-02-26 NOTE — Progress Notes (Signed)
Cardiology Office Note:    Date:  02/26/2021   ID:  Beverly Li, DOB 04-08-1946, MRN [id number]  PCP:  Beverly Dad, MD   [facility] Providers Cardiologist:  Candee Furbish, MD Electrophysiologist:  Beverly Axe, MD     Referring MD: Beverly Dad, MD     History of Present Illness:    Beverly Li is a 75 y.o. female here for follow up of hypertension, atrial fibrillation, and HOCM. Dr. Caryl Li, Tikosyn, prior cardioversion with underlying hypertrophic obstructive cardiomyopathy with prior severe swings in blood pressure, in part vagally mediated.   She called over to the office after feeling her heart rate in the 110 range.  She was feeling a bit more shortness of breath, more fatigue.  Her husband is out of town at [employer].  Via telephone message she was instructed to take an additional short acting diltiazem, evening Tikosyn and to come in the office this morning for further evaluation.   She recently saw Dr. Caryl Li with electrophysiology on 12/27/2019.  Office note reviewed.  At that time she noted a couple episodes of low blood pressure 100 or so and fast heart rate in the 120s rarely over the last year.  He stated that she continues to use midodrine and hydralazine as needed.   Probably had an episode of paroxysmal atrial fibrillation yesterday with the rapid heartbeat. Took diltiazem 30 PRN and felt better.    Feels rapid HR every 2 months. April, July and yesterday. Wakes her up. Uncomfortable, little nausea, racing.    Prior office visit review pulled forward given the complexity of her history:   hypertrophic obstructive cardiomyopathy, recent severe exacerbation again post cardiac catheterization and prior during femoropopliteal bypass resulting in  phenylephrine , fluids and prolonged intubation here for follow-up.   She underwent diagnostic heart catheterization on 05/07/16 and post procedure developed severe hypotension, hyper vagal response. Requiring once  again phenylephrine, facemask oxygen. She did not require intubation. 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 Hydralazine was given post catheterization wound systolic pressure pressure was 190. Profound hypotension and bradycardia noted. This was treated rapidly, fluids, phenylephrine, heart rates as low as the 40s.   Midodrine started. Phenylephrine eventually weaned off. Diuresis noted. Klonopin was started for anxiety, to hopefully help blunt the hyper adrenergic response. She denies being atypically anxious person however.   Echocardiogram 04/2016 showed EF 65-70% with hypertrophic obstructive cardiomyopathy with LVOT gradient of 23 mm at rest.   She is here for follow-up today and should have appointment set up with Dr. Caryl Li to discuss her profound autonomic dysfunction.   In 2014 started having "heart burn" episodes. Debilitating. Out walking dog. By time she would get back, sweat, diahpesis, up neck, nausea then diarrhea. Wiped out for a few hours. Does not feel it now with exercise. Felt 3 hours irreg hb afib kicked in. Afaid to go anywhere. The sweat Li over her hot. Pain. After she described this once again, this is why I proceeded with cardiac catheterization to make sure that with her peripheral vascular disease that she did not have severe coronary artery disease that was now becoming progressive. Thankfully, she did not have flow limiting disease. Moderate LAD, FFR.   Shortly after cardiac catheterization when her blood pressure increased to the [id number] systolic range, she was given hydralazine to reduce her blood pressure and she ended up having a hyper vagal response where her heart rate decreases the 40s,  blood pressure plummeted into the 50 systolic, she developed flash pulmonary edema. She was placed in the CCU, phenylephrine. Slowly, this was able to be weaned. Klonopin and Midodrin was started.   This episode post catheterization was very similar to  her episode intraoperatively during her femoropopliteal bypass. She was intubated for several days after that procedure and with her arterial line demonstrated classic spike and dome changes intermittently with marked variations in her blood pressure at various points.   09/16/16-She had cardiac MRI and stress echocardiogram. Both were reassuring. She did not drop her blood pressures during exercise. She did not increase her gradient significantly during exercise. We were able to invoke an increase left ventricular output tract gradient with Valsalva maneuver. Please see report. No ventricular arrhythmias. When she does have low blood pressure usually has a prodrome, very vagal-like response as noted previously.   03/31/17-she had a fall in [place] when walking on cobblestones in a garden with flip-flops during a house store with her girlfriends.  She states that she broke her fall with her lips.  She also broke her arm.  Dr. Durward Fortes.  She has not had any severe cardiac symptoms thankfully.  At one point during the last several months she did have low blood pressure and she took the Midrin which took effect after approximately 3 hours.     06/26/17 - 2 episodes with crashed BP, out for the day. Midodrine then. 185/110 at home.  She took a hydralazine at home and now her blood pressure is 158/90.  We once again went over how challenging it is for her given her hypertrophic cardiomyopathy and previous severe hypotension.     10/28/2017- cardioversion took place in June 2019 setting of 2-1 atrial flutter with rapid ventricular response for approximately 4 days causing symptoms.  Dr. Aundra Dubin performed cardioversion with Dr. Glennon Mac as anesthesiologist.  Dr. Caryl Li saw her in clinic prior to conversion. She did [place] the beach lasting approximately 2 or 3 hours at a heart rate that she says was 125 to 130 bpm.    12/28/18--She had been experiencing more shortness of breath.  She dealt with  discitis and prolonged IV antibiotic course.  After long discussion with infectious disease, we did not pursue invasive biopsy because of problems with anesthesia in the past. Relied on albuterol. No syncope in 6 months. Tikosyn has worked well. [place]he[place].  Has lost quite a bit of weight during this infection.  Over the last 6 months she is not had any drops in her blood pressure.   06/30/2019--Here for follow-up of hypertrophic cardiomyopathy.  Received both Covid vaccines.  Dr. Olin Pia note reviewed see below.  Pulmonary, Dr. Agustina Caroli note reviewed.  Severe obstruction noted on PFT.  She is now taking the inhaler Stiolto.  Interestingly, she was going through paperwork found her father's autopsy printed on onion paper.  She stated that hypertrophic heart was listed on his report.  09/12/20--Has been HTN, 200 at home. No CP. Feels AFIB as well.  Asked about potential breast implant removal.  Stated that she would be high risk for general anesthesia for elective surgeries.  Try to avoid any elective procedures.  She was seen in ED on 01/22/21 and admitted for 1 day for stroke. She was found at 30 AM by staff at the skilled nursing facility she lives in. She was observed to have worsening dysarthria and R-sided weakness. She was able to communicate in short phrases.   Today, she is  accompanied by her husband. She is using a wheelchair and supplemental oxygen. She is having difficulty speaking but is able to communicate in short statements. She had pneumonia in the hospital but it resolved. Overall, she is doing well.   Her blood pressure measurements at home are, on average, 130/80. Her husband reports her blood pressure could go up to 190/110. However, they have not seen these high readings recently.   Her husband recalls she had bilateral LE swelling at the hospital and she was given Lasix. She has not experienced swelling recently.  She denies any palpitations, chest pain, or shortness of breath. No  lightheadedness, headaches, syncope, orthopnea, PND, or exertional symptoms.   Past Medical History:  Diagnosis Date   Anxiety    Arthritis    "some in my lower back; probably elbows, knees" (11/18/2017)   Atrial fibrillation (HCC)    Bell's palsy    when pt. was 75 yrs old, when under stress the left side of face will droop.   Complication of anesthesia    "vascular OR 2016; BP bottomed out; couldn't get it regulated; ended up in ICU for DAYS" (11/18/2017)   GERD (gastroesophageal reflux disease)    History of kidney stones    Hypertension    Hypertrophic cardiomyopathy (HCC)    severe LV basilar hypertrophy witn no evidence of significant outflow tract obstruction, EF 65-70%, mild LAE, mild TR, grade 1a diastolic dysfunction 05/15/10 (Dr. Donato Schultz) (Atrial Septal Hypertrophy pattern)-- Intra-op TEE with dsignificant outflow tract obstruction - AI, MR & TR   Insomnia    Mild aortic sclerosis    Osteopenia    Peripheral vascular disease (HCC)    Syncope    , Vagal    Past Surgical History:  Procedure Laterality Date   AUGMENTATION MAMMAPLASTY Bilateral    BACK SURGERY     CARDIAC CATHETERIZATION N/A 05/07/2016   Procedure: Left Heart Cath and Coronary Angiography;  Surgeon: Kathleene Hazel, MD;  Location: [facility] INVASIVE CV LAB;  Service: Cardiovascular;  Laterality: N/A;   CARDIOVERSION N/A 09/24/2017   Procedure: CARDIOVERSION;  Surgeon: Laurey Morale, MD;  Location: [facility] ENDOSCOPY;  Service: Cardiovascular;  Laterality: N/A;   DILATION AND CURETTAGE OF UTERUS     ENDARTERECTOMY FEMORAL Right 03/02/2015   Procedure: ENDARTERECTOMY RIGHT FEMORAL;  Surgeon: Chuck Hint, MD;  Location: [facility] OR;  Service: Vascular;  Laterality: Right;   ESOPHAGOGASTRODUODENOSCOPY (EGD) WITH PROPOFOL N/A 12/01/2020   Procedure: ESOPHAGOGASTRODUODENOSCOPY (EGD) WITH PROPOFOL;  Surgeon: Diamantina Monks, MD;  Location: [facility] ENDOSCOPY;  Service: General;  Laterality: N/A;   FACIAL COSMETIC  SURGERY Left 2002   "related to Bell's Palsy @ age 38; left eye/side of face droopy; tried to make area symmetrical"   FEMORAL-POPLITEAL BYPASS GRAFT Right 03/02/2015   Procedure: BYPASS GRAFT FEMORAL-BELOW KNEE POPLITEAL ARTERY;  Surgeon: Chuck Hint, MD;  Location: [facility] OR;  Service: Vascular;  Laterality: Right;   INGUINAL HERNIA REPAIR Bilateral 2002   IR ANGIO INTRA EXTRACRAN SEL COM CAROTID INNOMINATE UNI L MOD SED  11/16/2020   IR CT HEAD LTD  11/13/2020   IR PERCUTANEOUS ART THROMBECTOMY/INFUSION INTRACRANIAL INC DIAG ANGIO  11/13/2020   OVARIAN CYST REMOVAL Left    PEG PLACEMENT N/A 12/01/2020   Procedure: PERCUTANEOUS ENDOSCOPIC GASTROSTOMY (PEG) PLACEMENT;  Surgeon: Diamantina Monks, MD;  Location: [facility] ENDOSCOPY;  Service: General;  Laterality: N/A;   PERIPHERAL VASCULAR CATHETERIZATION N/A 01/16/2015   Procedure: Abdominal Aortogram;  Surgeon: Chuck Hint, MD;  Location: [place] CV LAB;  Service: Cardiovascular;  Laterality: N/A;   POSTERIOR LUMBAR FUSION  2015   "have plates and screws in there"   [place] N/A 11/13/2020   Procedure: IR WITH ANESTHESIA;  Surgeon: Radiologist, Medication, MD;  Location: [place];  Service: Radiology;  Laterality: N/A;   TONSILLECTOMY      Current Medications: Current Meds  Medication Sig   acetaminophen (TYLENOL) 325 MG tablet Take 650 mg by mouth every 4 (four) hours as needed for mild pain.   amiodarone (PACERONE) 200 MG tablet Place 1 tablet (200 mg total) into feeding tube daily.   amLODipine (NORVASC) 5 MG tablet Take 5 mg by mouth daily. In the morning 8-11 am   apixaban (ELIQUIS) 5 MG TABS tablet Place 1 tablet (5 mg total) into feeding tube 2 (two) times daily.   budesonide (PULMICORT) 0.5 MG/2ML nebulizer solution Take 2 mLs (0.5 mg total) by nebulization 2 (two) times daily.   buPROPion (WELLBUTRIN) 75 MG tablet Place 1 tablet (75 mg total) into feeding tube 2 (two) times daily.   ezetimibe (ZETIA) 10 MG  tablet Place 1 tablet (10 mg total) into feeding tube daily.   furosemide (LASIX) 20 MG tablet Take 1 tablet (20 mg total) by mouth daily. (Patient taking differently: Take 20 mg by mouth daily as needed.)   hydrALAZINE (APRESOLINE) 10 MG tablet Take 10 mg by mouth 3 (three) times daily as needed. SBP>165 or DBP >95   ipratropium-albuterol (DUONEB) 0.5-2.5 (3) MG/3ML SOLN Take 3 mLs by nebulization every 4 (four) hours as needed.   melatonin 5 MG TABS Take 5 mg by mouth at bedtime.   metoprolol tartrate (LOPRESSOR) 50 MG tablet Place 1 tablet (50 mg total) into feeding tube 2 (two) times daily.   nystatin cream (MYCOSTATIN) Apply 1 application topically 2 (two) times daily.   pantoprazole sodium (PROTONIX) 40 mg/20 mL SUSP Place 40 mg into feeding tube 2 (two) times daily.   Water For Irrigation, Sterile (FREE WATER) SOLN Place 250 mLs into feeding tube 6 (six) times daily.     Allergies:   Amoxicillin, Atenolol, Crestor [rosuvastatin calcium], Pravastatin, Sulfa antibiotics, and Codeine   Social History   Socioeconomic History   Marital status: Married    Spouse name: Not on file   Number of children: Not on file   Years of education: Not on file   Highest education level: Not on file  Occupational History   Not on file  Tobacco Use   Smoking status: Former    Packs/day: 1.00    Years: 50.00    Pack years: 50.00    Types: Cigarettes    Quit date: 12/15/2014    Years since quitting: 6.2   Smokeless tobacco: Never  Vaping Use   Vaping Use: Never used  Substance and Sexual Activity   Alcohol use: Yes    Comment: 11/18/2017 "might have a couple glasses of wine/month; if that"   Drug use: Not on file   Sexual activity: Not Currently  Other Topics Concern   Not on file  Social History Narrative   Not on file   Social Determinants of Health   Financial Resource Strain: Not on file  Food Insecurity: Not on file  Transportation Needs: Not on file  Physical Activity: Not on file   Stress: Not on file  Social Connections: Not on file     Family History: The patient's family history includes Breast cancer in her sister; Cancer in  her father, mother, and sister; Hypertension in her mother; Liver cancer in her mother; Lung cancer in her father.  ROS:   Please see the history of present illness.  (+) Aphasia All other systems reviewed and are negative.  EKGs/Labs/Other Studies Reviewed:    The following studies were reviewed today: CTA 02-15-2021 1. Negative for emergent large vessel occlusion and no infarct core or ischemic penumbra detected by CT Perfusion. 2. 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 3. Recanalized and or improved Left MCA and distal Left ACA branches since August. 4. Stable atherosclerosis notable for: - high-grade proximal Left ICA stenosis estimated at 70%, due to bulky calcified plaque. - up to moderate bilateral vertebral artery stenosis (dominant left vertebral V4 segment and non dominant right vertebral artery origin). - mild to moderate supraclinoid Left ICA siphon stenosis due to calcified plaque. 5. Aortic Atherosclerosis (ICD10-I70.0). Emphysema (ICD10-J43.9).  CT Head Feb 15, 2021 1. Expected evolution of the left MCA and ACA territory infarcts since last month. Underlying chronic small vessel disease. 2. No new cortically based infarct or acute intracranial hemorrhage identified. ASPECTS 10. 3. These results were communicated to Drs. Lindzen and Collins at [employer] am on 02/15/21 by text page via the [employer] system.  07/18/2016: c.MRI IMPRESSION: 1) Hypertrophic cardiomyopathy with basal septum measuring 21 mm and spade-like ventrical 2) Hyperenhancement in the basal septum and apex 3) No frank SAM but small LVOT with peak recorded gradient 11 mmHg suggest echo correlation 4) Tri-leaflet aortic valve area by planimetry 2.0  cm2 5) Normal aortic root 6) Moderate LAE 7) Mild MR  8) Normal RV  05/07/2016: LHC Mid RCA lesion, 20 %stenosed. 2nd Mrg lesion, 20 %stenosed. Prox Cx to Mid Cx lesion, 30 %stenosed. Mid LAD lesion, 60 %stenosed.  1. Mild to moderate non-obstructive CAD 2. The LAD is a large caliber vessel that reaches the apex. The mid LAD has a moderate eccentric stenosis just after the moderate caliber diagonal branch. Angiographically this appears to be moderate.  FFR of this lesion suggests the stenosis is not flow limiting. (FFR 0.82-0.85).  3. The RCA and Circumflex have minor disease.  4. Abnormal LV pressures c/w HOCM.  Recommendations: Medical management of moderate non-obstructive CAD.   EKG: EKG was not ordered today 02/15/2021 (ED):  diffuse ST depressions along with some elevation in aVR  Recent Labs: 11/18/2020: TSH 1.087 01/23/2021: ALT 34; Magnesium 2.0 02/02/2021: B Natriuretic Peptide 1,459.3; BUN 21; Creatinine, Ser 0.72; Hemoglobin 14.4; Platelets 286; Potassium 3.5; Sodium 139  Recent Lipid Panel    Component Value Date/Time   CHOL 209 (H) 11/13/2020 1930   CHOL 211 (H) 09/16/2016 1200   TRIG 97 11/13/2020 1930   HDL 70 11/13/2020 1930   HDL 97 09/16/2016 1200   CHOLHDL 3.0 11/13/2020 1930   VLDL 19 11/13/2020 1930   LDLCALC 120 (H) 11/13/2020 1930   LDLCALC 98 09/16/2016 1200     Risk Assessment/Calculations:      Physical Exam:    VS:  BP 112/60 (BP Location: Left Arm, Patient Position: Sitting, Cuff Size: Normal)   Pulse 78   Ht 5\' 3"  (1.6 m)   Wt 103 lb (46.7 kg)   LMP  (LMP Unknown)   SpO2 96%   BMI 18.25 kg/m     Wt Readings from Last 3 Encounters:  02/26/21 103 lb (46.7 kg)  02/19/21 101 lb 9.6 oz (46.1 kg)  02/13/21 101 lb 9.6  oz (46.1 kg)     GEN:  Well nourished, well developed in no acute distress HEENT: Normal NECK: No JVD; No carotid bruits LYMPHATICS: No lymphadenopathy CARDIAC: RRR, no murmurs, rubs, gallops RESPIRATORY:  Clear to  auscultation without rales, wheezing or rhonchi  ABDOMEN: Soft, non-tender, non-distended MUSCULOSKELETAL:  No edema; No deformity  SKIN: Warm and dry NEUROLOGIC:  Alert and oriented x 3, aphasic PSYCHIATRIC:  Normal affect   ASSESSMENT:    1. 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 Paroxysmal atrial fibrillation ([place])   4. S/P femoral-popliteal bypass surgery   5. Coronary artery disease involving native coronary artery of native heart without angina pectoris   6. Essential hypertension     PLAN:    Obstructive hypertrophic cardiomyopathy (HCC) Previous echocardiograms reviewed.  We are going to change her Lasix to 20 mg as needed.  Watch for any signs of fluid overload.  Watch for any signs of shortness of breath.  She is clearly euvolemic at this point.  Excellent.  Previously has had hyper vagal responses and transient syncope.  Has used midodrine in the past.  Currently doing well.  On metoprolol 50 mg  Left middle cerebral artery stroke ([place]) Currently in wheelchair.  Able to communicate and speak in short statements.  Prior aspiration pneumonia.  Paroxysmal atrial fibrillation (HCC) Currently on amiodarone 200 mg a day.  Continue to monitor thyroid liver lung function.  Currently stable.  S/P femoral-popliteal bypass surgery Doing well, Dr. Scot Dock.  Well-perfused lower extremities.  Occasionally will have warm/cold feet.  Coronary artery disease involving native coronary artery of native heart without angina pectoris Mild to moderate nonobstructive CAD on prior catheterization.  Essential hypertension Much better control with amlodipine 5 mg a day.  Continue to monitor.  Lasix seem to help as well but now we are going to changes to as needed.  No lower extremity edema currently.  Hospital records data lab work all reviewed.  45 minutes spent in review talking with patient, husband who helped with history and  documentation  Medication Adjustments/Labs and Tests Ordered: Current medicines are reviewed at length with the patient today.  Concerns regarding medicines are outlined above.  No orders of the defined types were placed in this encounter.  No orders of the defined types were placed in this encounter.   Patient Instructions  Medication Instructions:  Please take Furosemide 20 mg as needed. Continue all other medications as listed.  *If you need a refill on your cardiac medications before your next appointment, please call your pharmacy*  Follow-Up: At [facility], you and your health needs are our priority.  As part of our continuing mission to provide you with exceptional heart care, we have created designated Provider Care Teams.  These Care Teams include your primary Cardiologist (physician) and Advanced Practice Providers (APPs -  Physician Assistants and Nurse Practitioners) who all work together to provide you with the care you need, when you need it.  We recommend signing up for the patient portal called "MyChart".  Sign up information is provided on this After Visit Summary.  MyChart is used to connect with patients for Virtual Visits (Telemedicine).  Patients are able to view lab/test results, encounter notes, upcoming appointments, etc.  Non-urgent messages can be sent to your provider as well.   To learn more about what you can do with MyChart, go to [email or web address].    Your next appointment:   6 month(s)  The format for your next appointment:   In Person  Provider:   Candee Furbish, MD     Thank you for choosing [facility]!!     Wilhemina Bonito as a scribe for Candee Furbish, MD.,have documented all relevant documentation on the behalf of Candee Furbish, MD,as directed by  Candee Furbish, MD while in the presence of Candee Furbish, MD.  I, Candee Furbish, MD, have reviewed all documentation for this visit. The documentation on 02/26/21 for the exam,  diagnosis, procedures, and orders are all accurate and complete.   Signed, Candee Furbish, MD  02/26/2021 3:05 PM    [facility]

## 2021-02-26 NOTE — Patient Instructions (Signed)
Medication Instructions:  Please take Furosemide 20 mg as needed. Continue all other medications as listed.  *If you need a refill on your cardiac medications before your next appointment, please call your pharmacy*  Follow-Up: At [facility], you and your health needs are our priority.  As part of our continuing mission to provide you with exceptional heart care, we have created designated Provider Care Teams.  These Care Teams include your primary Cardiologist (physician) and Advanced Practice Providers (APPs -  Physician Assistants and Nurse Practitioners) who all work together to provide you with the care you need, when you need it.  We recommend signing up for the patient portal called "MyChart".  Sign up information is provided on this After Visit Summary.  MyChart is used to connect with patients for Virtual Visits (Telemedicine).  Patients are able to view lab/test results, encounter notes, upcoming appointments, etc.  Non-urgent messages can be sent to your provider as well.   To learn more about what you can do with MyChart, go to [email or web address].    Your next appointment:   6 month(s)  The format for your next appointment:   In Person  Provider:   Donato Schultz, MD     Thank you for choosing [facility]!!

## 2021-02-26 NOTE — Assessment & Plan Note (Addendum)
Previous echocardiograms reviewed.  We are going to change her Lasix to 20 mg as needed.  Watch for any signs of fluid overload.  Watch for any signs of shortness of breath.  She is clearly euvolemic at this point.  Excellent.  Previously has had hyper vagal responses and transient syncope.  Has used midodrine in the past.  Currently doing well.  On metoprolol 50 mg

## 2021-02-26 NOTE — Assessment & Plan Note (Signed)
Doing well, Dr. Edilia Bo.  Well-perfused lower extremities.  Occasionally will have warm/cold feet.

## 2021-02-28 ENCOUNTER — Encounter: Payer: Medicare Other | Admitting: Physical Medicine & Rehabilitation

## 2021-03-06 ENCOUNTER — Ambulatory Visit (INDEPENDENT_AMBULATORY_CARE_PROVIDER_SITE_OTHER): Payer: Medicare Other | Admitting: Adult Health

## 2021-03-06 ENCOUNTER — Encounter: Payer: Self-pay | Admitting: Adult Health

## 2021-03-06 VITALS — BP 119/71 | HR 62

## 2021-03-06 DIAGNOSIS — I69320 Aphasia following cerebral infarction: Secondary | ICD-10-CM

## 2021-03-06 DIAGNOSIS — I480 Paroxysmal atrial fibrillation: Secondary | ICD-10-CM

## 2021-03-06 DIAGNOSIS — G8191 Hemiplegia, unspecified affecting right dominant side: Secondary | ICD-10-CM

## 2021-03-06 DIAGNOSIS — I63512 Cerebral infarction due to unspecified occlusion or stenosis of left middle cerebral artery: Secondary | ICD-10-CM

## 2021-03-06 DIAGNOSIS — I6521 Occlusion and stenosis of right carotid artery: Secondary | ICD-10-CM

## 2021-03-06 DIAGNOSIS — I6522 Occlusion and stenosis of left carotid artery: Secondary | ICD-10-CM

## 2021-03-06 NOTE — Progress Notes (Signed)
[facility] [address] [place] [address] [phone]       HOSPITAL FOLLOW UP NOTE  [name] Date of Birth:  1945/09/03 Medical Record Number:  [id number]   Reason for Referral:  hospital stroke follow up    SUBJECTIVE:   CHIEF COMPLAINT:  Chief Complaint  Patient presents with   Hospitalization Follow-up    Room 3. Pt here with husband.     HPI:   [name] is a 75 y.o. 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  Personally reviewed hospitalization pertinent progress notes, lab work and imaging.  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  CTA head/neck showed left MCA occlusion and age-indeterminate occlusion of proximal right ICA with non-opacification through the carotid terminus.  EF 60 to 65% with LA severely dilated.  LDL 120.  A1c 6.0. cards eval for A fib RVR converted to sinus 8/8 and restarted on Eliquis 8/6. Advised f/u with IR outpatient for severe preocclusive stenosis of proximal right ICA.  Continuation of Zetia 10 mg daily with history of statin allergy. Eval by [facility] right femoropopliteal bypass occlusion with no intervention recommended and outpatient follow-up.  Residual deficits of global aphasia, nonverbal, dysphagia s/p PEG, left gaze preference, right facial droop and right hemiparesis.  Discharged to SNF for functional decline and therapy needs.  Readmitted 8/30 - 12/21/2020 for aspiration pneumonia with acute respiratory failure with hypoxia. Admitted from home - therapies rec's CIR for decreased functional ability and  eventually d/c'd to SNF on 10/30.  Readmitted on 10/10 - 01/23/2021 for slurred speech and questionable worsening of right side per SNF staff. MR brain showed new/increased diffusion restriction in the basal ganglia, internal and external capsule extending to the carotid body/corona radiata compared to prior study 11/2020 consistent with acute to subacute infarct, additional diffusion restriction in the left MCA and ACA territories consistent with evolving subacute infarct seen on prior study, petechial hemorrhage noted and punctate chronic microhemorrhage in the left superior frontal gyrus.  CTA head/neck stable chronic right ICA occlusion and high-grade proximal left ICA stenosis estimated at 70%.  Hypertensive emergency upon arrival eventually stabilized.  Recommended continue Eliquis and Zetia for secondary stroke prevention.  She returned back to SNF  ER eval 02/02/2021 for likely COPD exacerbation -improvement after steroids and breathing treatment and stable on 3L oxygen. D/c'd back to facility after 12 hours    Today, 03/06/2021, she is being seen for initial hospital stroke follow-up accompanied by her husband.  She continues to reside at [facility] SNF.   Reports making progress more so in regards to speech and swallowing. She does have some movement on right side but seems to be more spontaneous.  She is currently working with PT/OT/SLP.  She has been walking during therapy sessions with RW.  She is currently maintaining Dys2 NTL diet without difficulty.  Continue to use of PEG for meds and flushes. Husband questions duration of PEG tube. He also asks duration of O2 (which has been present since admission for PNA) - deferred to SNF/PCP. Denies new stroke/TIA symptoms.   Per [facility]  review, remains on Eliquis and Zetia -denies side effects Blood pressure today 119/71  No further concerns at this time     PERTINENT IMAGING  11/13/20 CT Head WO IV Contrast 1. Mild asymmetric hypodensity of  the left insula, concerning for acute left MCA territory infarct. An MRI could further evaluate. 2. Hyperdense appearance of the distal left M1 MCA, concerning for thrombus. Recommend CTA to further evaluate. 3. No acute hemorrhage. 4. Remote lacunar infarcts versus dilated perivascular spaces in bilateral inferior basal ganglia. Suspected remote lacunar infarct in the right cerebellum.   11/13/20 CT Angio Head and Neck W WO IV Contrast 1. Occlusion of the left M1 MCA with very little opacification of distal left MCA branches. Perfusion demonstrates the core infarct of 41 mL in the anterior left MCA territory with large (122 ml) of ischemic phenomena in the remaining left MCA and potentially left ACA territories. 2. Age indeterminate occlusion of the proximal right internal carotid artery in the neck with non-opacification through the carotid terminus. The right MCA and right posterior communicating artery are opacified via the of the right A1 ACA. 3. Suspected high-grade stenosis versus occlusion of the distal left A3/A4 ACA. 4. Approximately 40-50% stenosis of the proximal left ICA in the neck. 5. Severe stenosis of the non dominant/small right vertebral artery origin. Moderate stenosis of the intradural left vertebral artery. 6. Severe emphysema.   MRI Brain WO IV Contrast 11/14/2020  1. 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 Associated edema without mass effect. 2. Additional scattered punctate acute infarcts in the right frontoparietal cortex and small acute infarct in the right cerebellum.  01/22/2021 IMPRESSION: 1. New/increased diffusion restriction in the left basal ganglia, internal and external capsules, extending to the caudate body/corona radiata compared to the study from 11/14/2020 consistent with acute to subacute infarct. 2. Additional diffusion restriction in the left MCA and ACA territories consistent with evolving subacute infarct as seen on  the study from 11/14/2020. 3. Curvilinear SWI signal dropout along the left precentral sulcus consistent with petechial hemorrhage. There is an additional focus of petechial hemorrhage versus punctate chronic microhemorrhage in the left superior frontal gyrus.   11/14/20 Echocardiogram Complete   1. There is no evidence of systolic anterior motion of the mitral valve or true LV outflow obstruction on this study. LVOT velocities are mildly elevated at 2 m/s. Left ventricular ejection fraction, by estimation, is 60 to 65%. The left ventricle has low normal function. The left ventricle has no regional wall motion abnormalities. There is moderate concentric left ventricular hypertrophy. Left ventricular diastolic parameters are consistent with Grade II diastolic dysfunction (pseudonormalization). Elevated left atrial pressure.   2. Right ventricular systolic function is normal. The right ventricular size is normal.   3. Left atrial size was severely dilated.   4. Right atrial size was mildly dilated.   5. The mitral valve is degenerative. Mild to moderate mitral valve regurgitation.   6. The aortic valve is tricuspid. There is moderate calcification of the aortic valve. There is mild thickening of the aortic valve. Aortic valve regurgitation is mild. Mild to moderate aortic valve  sclerosis/calcification is present, without any evidence of aortic stenosis.   7. The inferior vena cava is dilated in size with <50% respiratory variability, suggesting right atrial pressure of 15 mmHg.     ROS:   14 system review of systems performed and negative with exception of those listed in HPI  PMH:  Past Medical History:  Diagnosis  Date   Anxiety    Arthritis    "some in my lower back; probably elbows, knees" (11/18/2017)   Atrial fibrillation (HCC)    Bell's palsy    when pt. was 75 yrs old, when under stress the left side of face will droop.   Complication of anesthesia    "vascular OR 2016; BP bottomed  out; couldn't get it regulated; ended up in ICU for DAYS" (11/18/2017)   GERD (gastroesophageal reflux disease)    History of kidney stones    Hypertension    Hypertrophic cardiomyopathy (HCC)    severe LV basilar hypertrophy witn no evidence of significant outflow tract obstruction, EF 65-70%, mild LAE, mild TR, grade 1a diastolic dysfunction 05/15/10 (Dr. Donato Schultz) (Atrial Septal Hypertrophy pattern)-- Intra-op TEE with dsignificant outflow tract obstruction - AI, MR & TR   Insomnia    Mild aortic sclerosis    Osteopenia    Peripheral vascular disease (HCC)    Syncope    , Vagal    PSH:  Past Surgical History:  Procedure Laterality Date   AUGMENTATION MAMMAPLASTY Bilateral    BACK SURGERY     CARDIAC CATHETERIZATION N/A 05/07/2016   Procedure: Left Heart Cath and Coronary Angiography;  Surgeon: Kathleene Hazel, MD;  Location: [facility] INVASIVE CV LAB;  Service: Cardiovascular;  Laterality: N/A;   CARDIOVERSION N/A 09/24/2017   Procedure: CARDIOVERSION;  Surgeon: Laurey Morale, MD;  Location: [facility] ENDOSCOPY;  Service: Cardiovascular;  Laterality: N/A;   DILATION AND CURETTAGE OF UTERUS     ENDARTERECTOMY FEMORAL Right 03/02/2015   Procedure: ENDARTERECTOMY RIGHT FEMORAL;  Surgeon: Chuck Hint, MD;  Location: [facility] OR;  Service: Vascular;  Laterality: Right;   ESOPHAGOGASTRODUODENOSCOPY (EGD) WITH PROPOFOL N/A 12/01/2020   Procedure: ESOPHAGOGASTRODUODENOSCOPY (EGD) WITH PROPOFOL;  Surgeon: Diamantina Monks, MD;  Location: [facility] ENDOSCOPY;  Service: General;  Laterality: N/A;   FACIAL COSMETIC SURGERY Left 2002   "related to Bell's Palsy @ age 30; left eye/side of face droopy; tried to make area symmetrical"   FEMORAL-POPLITEAL BYPASS GRAFT Right 03/02/2015   Procedure: BYPASS GRAFT FEMORAL-BELOW KNEE POPLITEAL ARTERY;  Surgeon: Chuck Hint, MD;  Location: [facility] OR;  Service: Vascular;  Laterality: Right;   INGUINAL HERNIA REPAIR Bilateral 2002   IR ANGIO INTRA EXTRACRAN  SEL COM CAROTID INNOMINATE UNI L MOD SED  11/16/2020   IR CT HEAD LTD  11/13/2020   IR PERCUTANEOUS ART THROMBECTOMY/INFUSION INTRACRANIAL INC DIAG ANGIO  11/13/2020   OVARIAN CYST REMOVAL Left    PEG PLACEMENT N/A 12/01/2020   Procedure: PERCUTANEOUS ENDOSCOPIC GASTROSTOMY (PEG) PLACEMENT;  Surgeon: Diamantina Monks, MD;  Location: [facility] ENDOSCOPY;  Service: General;  Laterality: N/A;   PERIPHERAL VASCULAR CATHETERIZATION N/A 01/16/2015   Procedure: Abdominal Aortogram;  Surgeon: Chuck Hint, MD;  Location: [facility] INVASIVE CV LAB;  Service: Cardiovascular;  Laterality: N/A;   POSTERIOR LUMBAR FUSION  2015   "have plates and screws in there"   RADIOLOGY WITH ANESTHESIA N/A 11/13/2020   Procedure: IR WITH ANESTHESIA;  Surgeon: Radiologist, Medication, MD;  Location: [facility] OR;  Service: Radiology;  Laterality: N/A;   TONSILLECTOMY      Social History:  Social History   Socioeconomic History   Marital status: Married    Spouse name: Not on file   Number of children: Not on file   Years of education: Not on file   Highest education level: Not on file  Occupational History   Not on file  Tobacco Use  Smoking status: Former    Packs/day: 1.00    Years: 50.00    Pack years: 50.00    Types: Cigarettes    Quit date: 12/15/2014    Years since quitting: 6.2   Smokeless tobacco: Never  Vaping Use   Vaping Use: Never used  Substance and Sexual Activity   Alcohol use: Yes    Comment: 11/18/2017 "might have a couple glasses of wine/month; if that"   Drug use: Not on file   Sexual activity: Not Currently  Other Topics Concern   Not on file  Social History Narrative   Not on file   Social Determinants of Health   Financial Resource Strain: Not on file  Food Insecurity: Not on file  Transportation Needs: Not on file  Physical Activity: Not on file  Stress: Not on file  Social Connections: Not on file  Intimate Partner Violence: Not on file    Family History:  Family History  Problem  Relation Age of Onset   Liver cancer Mother    Cancer Mother        Liver   Hypertension Mother    Lung cancer Father    Cancer Father        Lung   Breast cancer Sister    Cancer Sister        Breast    Medications:   Current Outpatient Medications on File Prior to Visit  Medication Sig Dispense Refill   amiodarone (PACERONE) 200 MG tablet Place 1 tablet (200 mg total) into feeding tube daily.     acetaminophen (TYLENOL) 325 MG tablet Take 650 mg by mouth every 4 (four) hours as needed for mild pain.     amLODipine (NORVASC) 5 MG tablet Take 5 mg by mouth daily. In the morning 8-11 am     apixaban (ELIQUIS) 5 MG TABS tablet Place 1 tablet (5 mg total) into feeding tube 2 (two) times daily. 60 tablet    budesonide (PULMICORT) 0.5 MG/2ML nebulizer solution Take 2 mLs (0.5 mg total) by nebulization 2 (two) times daily.  12   ezetimibe (ZETIA) 10 MG tablet Place 1 tablet (10 mg total) into feeding tube daily.     furosemide (LASIX) 20 MG tablet Take 1 tablet (20 mg total) by mouth daily. (Patient taking differently: Take 20 mg by mouth daily as needed.) 90 tablet 3   hydrALAZINE (APRESOLINE) 10 MG tablet Take 10 mg by mouth 3 (three) times daily as needed. SBP>165 or DBP >95     ipratropium-albuterol (DUONEB) 0.5-2.5 (3) MG/3ML SOLN Take 3 mLs by nebulization every 4 (four) hours as needed. 360 mL    melatonin 5 MG TABS Take 5 mg by mouth at bedtime.     metoprolol tartrate (LOPRESSOR) 50 MG tablet Place 1 tablet (50 mg total) into feeding tube 2 (two) times daily.     pantoprazole sodium (PROTONIX) 40 mg/20 mL SUSP Place 40 mg into feeding tube 2 (two) times daily.     Water For Irrigation, Sterile (FREE WATER) SOLN Place 250 mLs into feeding tube 6 (six) times daily.     No current facility-administered medications on file prior to visit.    Allergies:   Allergies  Allergen Reactions   Amoxicillin Other (See Comments)    UTI Has patient had a PCN reaction causing immediate rash,  facial/tongue/throat swelling, SOB or lightheadedness with hypotension: No Has patient had a PCN reaction causing severe rash involving mucus membranes or skin necrosis: No Has patient had  a PCN reaction that required hospitalization: No Has patient had a PCN reaction occurring within the last 10 years: Yes--UTI ONLY If all of the above answers are "NO", then may proceed with Cephalosporin use.    Atenolol Cough   Crestor [Rosuvastatin Calcium] Other (See Comments)    Muscle aches   Pravastatin Other (See Comments)    Muscle aches   Sulfa Antibiotics Nausea Only   Codeine Other (See Comments)    hallucinations      OBJECTIVE:  Physical Exam  Vitals:   03/06/21 1408  BP: 119/71  Pulse: 62   There is no height or weight on file to calculate BMI. No results found.  General: Frail very pleasant elderly Caucasian female with O2 via [place], seated, in no evident distress Head: head normocephalic and atraumatic.   Neck: supple with no carotid or supraclavicular bruits Cardiovascular: regular rate and rhythm, no murmurs Musculoskeletal: no deformity Skin:  no rash/petichiae Vascular:  Normal pulses all extremities   Neurologic Exam Mental Status: Awake and fully alert.  Moderate aphasia and dysarthria with speech hesitancy. Able to speak in short sentences.  No evidence of receptive aphasia.  Oriented to place and time. Recent and remote memory intact. Attention span, concentration and fund of knowledge appropriate during visit. Mood and affect appropriate.  Cranial Nerves: Fundoscopic exam reveals sharp disc margins. Pupils equal, briskly reactive to light. Extraocular movements full without nystagmus. Visual fields full to confrontation. Hearing intact. Facial sensation intact.  Left facial weakness (chronic 2/2 Bells Palsy). tongue, and palate moves normally and symmetrically.  Motor: Normal strength on left side. Dense right hemiplegia on exam today Sensory.: intact to touch ,  pinprick , position and vibratory sensation.  Coordination: Rapid alternating movements normal on left side. Finger-to-nose and heel-to-shin performed accurately on left side. Gait and Station: Deferred Reflexes: 2+ right UE and LE, 1+ left side. Toes downgoing.      NIHSS  12 Modified Rankin  3-4      ASSESSMENT: Beverly Li is a 75 y.o. year old female with a left MCA stroke s/p IR with TICI 3 reperfusion in setting of PAF taken off Eliquis x1 week due to abdominal hematoma on 11/13/2020 and likely recrudescence of prior left MCA and ACA stroke with additional left hemispheric infarcts on 01/22/2021. Vascular risk factors include HTN, HLD, PAF, chronic right ICA occlusion, left ICA stenosis, intracranial stenosis, hypertrophic cardiomyopathy, mild aortic sclerosis, PVD and hx of left-sided Bell's palsy.      PLAN:  Left MCA strokes:  Residual deficit: Moderate aphasia and dysarthria, dense right hemiplegia and dysphagia.  Continue working with SNF PT/OT/SLP.  Ongoing use of PEG tube. Advised husband removal of PEG is based on her recovery - this can be discussed at follow-up visit.  Discussed typical recovery time but unable to determine amt of recovery at this time Continue Eliquis (apixaban) daily  and Zetia for secondary stroke prevention.   Discussed secondary stroke prevention measures and importance of close PCP follow up for aggressive stroke risk factor management. I have gone over the pathophysiology of stroke, warning signs and symptoms, risk factors and their management in some detail with instructions to go to the closest emergency room for symptoms of concern. PAF: On Eliquis 5 mg twice daily for CHA2DS2-VASc score of at least 7.  Followed by cardiology HTN: BP goal <130/90.  Stable on current regimen per PCP/SNF HLD: LDL goal <70. Recent LDL 120.  Continue Zetia 10 mg daily per PCP/SNF.  Hx of statin allergy Carotid stenosis: recommend f/u with vascular surgery after  further recovery - she would likely not be candidate for any type of intervention at this time.     Follow up in 4 months or call earlier if needed   CC:  [facility] provider: Dr. Leonie Man PCP: Virgie Dad, MD    I spent 57 minutes of face-to-face and non-face-to-face time with patient and husband.  This included previsit chart review including review of recent hospitalization, lab review, study review, electronic health record documentation, patient and husband education regarding recent stroke including etiology, secondary stroke prevention measures and importance of managing stroke risk factors, residual deficits and typical recovery time and answered all other questions to patient and husband's satisfaction  Frann Rider, AGNP-BC  [facility] [address] [place], [place] [address]  Phone [phone] Fax [phone] Note: This document was prepared with digital dictation and possible smart phrase technology. Any transcriptional errors that result from this process are unintentional.

## 2021-03-06 NOTE — Patient Instructions (Signed)
Continue working with therapies for hopeful ongoing recovery - keep up the good work!!   Continue Eliquis (apixaban) daily  and Zetia 10mg  daily  for secondary stroke prevention  Continue to follow up with PCP regarding cholesterol and blood pressure management  Maintain strict control of hypertension with blood pressure goal below 130/90 and cholesterol with LDL cholesterol (bad cholesterol) goal below 70 mg/dL.       Followup in the future with me in 4 months or call earlier if needed       Thank you for coming to see at [facility]. I hope we have been able to provide you high quality care today.  You may receive a patient satisfaction survey over the next few weeks. We would appreciate your feedback and comments so that we may continue to improve ourselves and the health of our patients.

## 2021-03-09 NOTE — Progress Notes (Signed)
I agree with the above plan 

## 2021-03-15 DIAGNOSIS — I69351 Hemiplegia and hemiparesis following cerebral infarction affecting right dominant side: Secondary | ICD-10-CM | POA: Diagnosis not present

## 2021-03-15 DIAGNOSIS — R2689 Other abnormalities of gait and mobility: Secondary | ICD-10-CM | POA: Diagnosis not present

## 2021-03-15 DIAGNOSIS — R293 Abnormal posture: Secondary | ICD-10-CM | POA: Diagnosis not present

## 2021-03-15 DIAGNOSIS — I69390 Apraxia following cerebral infarction: Secondary | ICD-10-CM | POA: Diagnosis not present

## 2021-03-15 DIAGNOSIS — M6389 Disorders of muscle in diseases classified elsewhere, multiple sites: Secondary | ICD-10-CM | POA: Diagnosis not present

## 2021-03-15 DIAGNOSIS — M62561 Muscle wasting and atrophy, not elsewhere classified, right lower leg: Secondary | ICD-10-CM | POA: Diagnosis not present

## 2021-03-15 DIAGNOSIS — I69320 Aphasia following cerebral infarction: Secondary | ICD-10-CM | POA: Diagnosis not present

## 2021-03-15 DIAGNOSIS — R278 Other lack of coordination: Secondary | ICD-10-CM | POA: Diagnosis not present

## 2021-03-15 DIAGNOSIS — I63312 Cerebral infarction due to thrombosis of left middle cerebral artery: Secondary | ICD-10-CM | POA: Diagnosis not present

## 2021-03-15 DIAGNOSIS — I69391 Dysphagia following cerebral infarction: Secondary | ICD-10-CM | POA: Diagnosis not present

## 2021-03-15 DIAGNOSIS — I6602 Occlusion and stenosis of left middle cerebral artery: Secondary | ICD-10-CM | POA: Diagnosis not present

## 2021-03-16 ENCOUNTER — Other Ambulatory Visit: Payer: Self-pay

## 2021-03-16 DIAGNOSIS — I69391 Dysphagia following cerebral infarction: Secondary | ICD-10-CM | POA: Diagnosis not present

## 2021-03-16 DIAGNOSIS — I6602 Occlusion and stenosis of left middle cerebral artery: Secondary | ICD-10-CM | POA: Diagnosis not present

## 2021-03-16 DIAGNOSIS — I69351 Hemiplegia and hemiparesis following cerebral infarction affecting right dominant side: Secondary | ICD-10-CM | POA: Diagnosis not present

## 2021-03-16 DIAGNOSIS — M62561 Muscle wasting and atrophy, not elsewhere classified, right lower leg: Secondary | ICD-10-CM | POA: Diagnosis not present

## 2021-03-16 DIAGNOSIS — I69390 Apraxia following cerebral infarction: Secondary | ICD-10-CM | POA: Diagnosis not present

## 2021-03-16 DIAGNOSIS — R278 Other lack of coordination: Secondary | ICD-10-CM | POA: Diagnosis not present

## 2021-03-16 DIAGNOSIS — M6389 Disorders of muscle in diseases classified elsewhere, multiple sites: Secondary | ICD-10-CM | POA: Diagnosis not present

## 2021-03-16 DIAGNOSIS — I63312 Cerebral infarction due to thrombosis of left middle cerebral artery: Secondary | ICD-10-CM | POA: Diagnosis not present

## 2021-03-16 DIAGNOSIS — R293 Abnormal posture: Secondary | ICD-10-CM | POA: Diagnosis not present

## 2021-03-16 DIAGNOSIS — R2689 Other abnormalities of gait and mobility: Secondary | ICD-10-CM | POA: Diagnosis not present

## 2021-03-16 DIAGNOSIS — I69320 Aphasia following cerebral infarction: Secondary | ICD-10-CM | POA: Diagnosis not present

## 2021-03-16 NOTE — Patient Outreach (Signed)
[employer] Customer service manager [facility]) Care Management  03/16/2021  Beverly Li 1946/03/10 [id number]   No telephone outreach to patient to obtain mRS. As was successfully completed by Dr Lexine Baton 03/06/21. MRS= 4   [name] [facility] Care Management Assistant

## 2021-03-19 DIAGNOSIS — I69390 Apraxia following cerebral infarction: Secondary | ICD-10-CM | POA: Diagnosis not present

## 2021-03-19 DIAGNOSIS — R2689 Other abnormalities of gait and mobility: Secondary | ICD-10-CM | POA: Diagnosis not present

## 2021-03-19 DIAGNOSIS — R293 Abnormal posture: Secondary | ICD-10-CM | POA: Diagnosis not present

## 2021-03-19 DIAGNOSIS — M6389 Disorders of muscle in diseases classified elsewhere, multiple sites: Secondary | ICD-10-CM | POA: Diagnosis not present

## 2021-03-19 DIAGNOSIS — I6602 Occlusion and stenosis of left middle cerebral artery: Secondary | ICD-10-CM | POA: Diagnosis not present

## 2021-03-19 DIAGNOSIS — R278 Other lack of coordination: Secondary | ICD-10-CM | POA: Diagnosis not present

## 2021-03-19 DIAGNOSIS — I69320 Aphasia following cerebral infarction: Secondary | ICD-10-CM | POA: Diagnosis not present

## 2021-03-19 DIAGNOSIS — I69391 Dysphagia following cerebral infarction: Secondary | ICD-10-CM | POA: Diagnosis not present

## 2021-03-19 DIAGNOSIS — M62561 Muscle wasting and atrophy, not elsewhere classified, right lower leg: Secondary | ICD-10-CM | POA: Diagnosis not present

## 2021-03-19 DIAGNOSIS — I69351 Hemiplegia and hemiparesis following cerebral infarction affecting right dominant side: Secondary | ICD-10-CM | POA: Diagnosis not present

## 2021-03-19 DIAGNOSIS — I63312 Cerebral infarction due to thrombosis of left middle cerebral artery: Secondary | ICD-10-CM | POA: Diagnosis not present

## 2021-03-20 DIAGNOSIS — R2689 Other abnormalities of gait and mobility: Secondary | ICD-10-CM | POA: Diagnosis not present

## 2021-03-20 DIAGNOSIS — I63312 Cerebral infarction due to thrombosis of left middle cerebral artery: Secondary | ICD-10-CM | POA: Diagnosis not present

## 2021-03-20 DIAGNOSIS — I6602 Occlusion and stenosis of left middle cerebral artery: Secondary | ICD-10-CM | POA: Diagnosis not present

## 2021-03-20 DIAGNOSIS — I69320 Aphasia following cerebral infarction: Secondary | ICD-10-CM | POA: Diagnosis not present

## 2021-03-20 DIAGNOSIS — R278 Other lack of coordination: Secondary | ICD-10-CM | POA: Diagnosis not present

## 2021-03-20 DIAGNOSIS — M6389 Disorders of muscle in diseases classified elsewhere, multiple sites: Secondary | ICD-10-CM | POA: Diagnosis not present

## 2021-03-20 DIAGNOSIS — I69391 Dysphagia following cerebral infarction: Secondary | ICD-10-CM | POA: Diagnosis not present

## 2021-03-20 DIAGNOSIS — I69351 Hemiplegia and hemiparesis following cerebral infarction affecting right dominant side: Secondary | ICD-10-CM | POA: Diagnosis not present

## 2021-03-20 DIAGNOSIS — I69390 Apraxia following cerebral infarction: Secondary | ICD-10-CM | POA: Diagnosis not present

## 2021-03-20 DIAGNOSIS — R293 Abnormal posture: Secondary | ICD-10-CM | POA: Diagnosis not present

## 2021-03-21 DIAGNOSIS — M6389 Disorders of muscle in diseases classified elsewhere, multiple sites: Secondary | ICD-10-CM | POA: Diagnosis not present

## 2021-03-21 DIAGNOSIS — R293 Abnormal posture: Secondary | ICD-10-CM | POA: Diagnosis not present

## 2021-03-21 DIAGNOSIS — I63312 Cerebral infarction due to thrombosis of left middle cerebral artery: Secondary | ICD-10-CM | POA: Diagnosis not present

## 2021-03-21 DIAGNOSIS — R278 Other lack of coordination: Secondary | ICD-10-CM | POA: Diagnosis not present

## 2021-03-21 DIAGNOSIS — M62561 Muscle wasting and atrophy, not elsewhere classified, right lower leg: Secondary | ICD-10-CM | POA: Diagnosis not present

## 2021-03-21 DIAGNOSIS — R2689 Other abnormalities of gait and mobility: Secondary | ICD-10-CM | POA: Diagnosis not present

## 2021-03-21 DIAGNOSIS — I69351 Hemiplegia and hemiparesis following cerebral infarction affecting right dominant side: Secondary | ICD-10-CM | POA: Diagnosis not present

## 2021-03-22 DIAGNOSIS — I69390 Apraxia following cerebral infarction: Secondary | ICD-10-CM | POA: Diagnosis not present

## 2021-03-22 DIAGNOSIS — R2689 Other abnormalities of gait and mobility: Secondary | ICD-10-CM | POA: Diagnosis not present

## 2021-03-22 DIAGNOSIS — I69351 Hemiplegia and hemiparesis following cerebral infarction affecting right dominant side: Secondary | ICD-10-CM | POA: Diagnosis not present

## 2021-03-22 DIAGNOSIS — M6389 Disorders of muscle in diseases classified elsewhere, multiple sites: Secondary | ICD-10-CM | POA: Diagnosis not present

## 2021-03-22 DIAGNOSIS — I69391 Dysphagia following cerebral infarction: Secondary | ICD-10-CM | POA: Diagnosis not present

## 2021-03-22 DIAGNOSIS — R278 Other lack of coordination: Secondary | ICD-10-CM | POA: Diagnosis not present

## 2021-03-22 DIAGNOSIS — I69320 Aphasia following cerebral infarction: Secondary | ICD-10-CM | POA: Diagnosis not present

## 2021-03-22 DIAGNOSIS — I63312 Cerebral infarction due to thrombosis of left middle cerebral artery: Secondary | ICD-10-CM | POA: Diagnosis not present

## 2021-03-22 DIAGNOSIS — R293 Abnormal posture: Secondary | ICD-10-CM | POA: Diagnosis not present

## 2021-03-22 DIAGNOSIS — I6602 Occlusion and stenosis of left middle cerebral artery: Secondary | ICD-10-CM | POA: Diagnosis not present

## 2021-03-23 DIAGNOSIS — M6389 Disorders of muscle in diseases classified elsewhere, multiple sites: Secondary | ICD-10-CM | POA: Diagnosis not present

## 2021-03-23 DIAGNOSIS — R293 Abnormal posture: Secondary | ICD-10-CM | POA: Diagnosis not present

## 2021-03-23 DIAGNOSIS — R278 Other lack of coordination: Secondary | ICD-10-CM | POA: Diagnosis not present

## 2021-03-23 DIAGNOSIS — I69391 Dysphagia following cerebral infarction: Secondary | ICD-10-CM | POA: Diagnosis not present

## 2021-03-23 DIAGNOSIS — I69351 Hemiplegia and hemiparesis following cerebral infarction affecting right dominant side: Secondary | ICD-10-CM | POA: Diagnosis not present

## 2021-03-23 DIAGNOSIS — I63312 Cerebral infarction due to thrombosis of left middle cerebral artery: Secondary | ICD-10-CM | POA: Diagnosis not present

## 2021-03-23 DIAGNOSIS — I69320 Aphasia following cerebral infarction: Secondary | ICD-10-CM | POA: Diagnosis not present

## 2021-03-23 DIAGNOSIS — I6602 Occlusion and stenosis of left middle cerebral artery: Secondary | ICD-10-CM | POA: Diagnosis not present

## 2021-03-23 DIAGNOSIS — R2689 Other abnormalities of gait and mobility: Secondary | ICD-10-CM | POA: Diagnosis not present

## 2021-03-23 DIAGNOSIS — I69390 Apraxia following cerebral infarction: Secondary | ICD-10-CM | POA: Diagnosis not present

## 2021-03-23 DIAGNOSIS — M62561 Muscle wasting and atrophy, not elsewhere classified, right lower leg: Secondary | ICD-10-CM | POA: Diagnosis not present

## 2021-03-26 ENCOUNTER — Encounter: Payer: Self-pay | Admitting: Internal Medicine

## 2021-03-26 ENCOUNTER — Non-Acute Institutional Stay (SKILLED_NURSING_FACILITY): Payer: Medicare Other | Admitting: Internal Medicine

## 2021-03-26 DIAGNOSIS — J410 Simple chronic bronchitis: Secondary | ICD-10-CM

## 2021-03-26 DIAGNOSIS — Z79899 Other long term (current) drug therapy: Secondary | ICD-10-CM | POA: Diagnosis not present

## 2021-03-26 DIAGNOSIS — I69359 Hemiplegia and hemiparesis following cerebral infarction affecting unspecified side: Secondary | ICD-10-CM

## 2021-03-26 DIAGNOSIS — I10 Essential (primary) hypertension: Secondary | ICD-10-CM | POA: Diagnosis not present

## 2021-03-26 DIAGNOSIS — I69391 Dysphagia following cerebral infarction: Secondary | ICD-10-CM

## 2021-03-26 DIAGNOSIS — K219 Gastro-esophageal reflux disease without esophagitis: Secondary | ICD-10-CM

## 2021-03-26 DIAGNOSIS — R404 Transient alteration of awareness: Secondary | ICD-10-CM

## 2021-03-26 DIAGNOSIS — E782 Mixed hyperlipidemia: Secondary | ICD-10-CM | POA: Diagnosis not present

## 2021-03-26 DIAGNOSIS — I421 Obstructive hypertrophic cardiomyopathy: Secondary | ICD-10-CM

## 2021-03-26 DIAGNOSIS — I480 Paroxysmal atrial fibrillation: Secondary | ICD-10-CM

## 2021-03-26 DIAGNOSIS — F339 Major depressive disorder, recurrent, unspecified: Secondary | ICD-10-CM

## 2021-03-26 LAB — CBC: RBC: 4.9 (ref 3.87–5.11)

## 2021-03-26 LAB — CBC AND DIFFERENTIAL
HCT: 43 (ref 36–46)
Hemoglobin: 14.2 (ref 12.0–16.0)
Platelets: 241 (ref 150–399)
WBC: 7.5

## 2021-03-26 LAB — HEPATIC FUNCTION PANEL
ALT: 31 (ref 7–35)
AST: 23 (ref 13–35)
Alkaline Phosphatase: 67 (ref 25–125)

## 2021-03-26 LAB — BASIC METABOLIC PANEL
BUN: 32 — AB (ref 4–21)
CO2: 29 — AB (ref 13–22)
Chloride: 100 (ref 99–108)
Creatinine: 0.7 (ref 0.5–1.1)
Glucose: 154
Potassium: 3.9 (ref 3.4–5.3)
Sodium: 136 — AB (ref 137–147)

## 2021-03-26 LAB — COMPREHENSIVE METABOLIC PANEL
Albumin: 3.3 — AB (ref 3.5–5.0)
Calcium: 8.9 (ref 8.7–10.7)

## 2021-03-26 NOTE — Progress Notes (Signed)
Location: [place] Room Number: 116 Place of Service:  SNF [phone])  Provider: Dr. Veleta Miners  Code Status: Full Code Goals of Care:  Advanced Directives 03/26/2021  Does Patient Have a Medical Advance Directive? Yes  Type of Advance Directive Living will  Does patient want to make changes to medical advance directive? No - Patient declined  Copy of [place] in Chart? -  Would patient like information on creating a medical advance directive? -     Chief Complaint  Patient presents with   Acute Visit    Possible TIA    HPI: Patient is a 75 y.o. 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 This morning Noticed by Nurses not responding as she  usually does.It  lasted  for few min.  This happened when they were transferring from wheelchair to her bed.  No seizures.  After few minutes she became normal again.  Now her only complaint is that she feels weak and tired.  Did not want to do therapy this morning after the episode No Fever Cough or SOB No Dysuria  Past Medical History:  Diagnosis Date   Anxiety    Arthritis    "some in my lower back; probably elbows, knees" (11/18/2017)   Atrial fibrillation (HCC)    Bell's palsy    when pt. was 75 yrs old, when under stress the left side of face will droop.   Complication of anesthesia    "vascular OR 2016; BP bottomed out; couldn't get it regulated; ended up in ICU for DAYS"  (11/18/2017)   GERD (gastroesophageal reflux disease)    History of kidney stones    Hypertension    Hypertrophic cardiomyopathy (HCC)    severe LV basilar hypertrophy witn no evidence of significant outflow tract obstruction, EF 65-70%, mild LAE, mild TR, grade 1a diastolic dysfunction [id number] (Dr. Candee Furbish) (Atrial Septal Hypertrophy pattern)-- Intra-op TEE with dsignificant outflow tract obstruction - AI, MR & TR   Insomnia    Mild aortic sclerosis    Osteopenia    Peripheral vascular disease (HCC)    Syncope    , Vagal    Past Surgical History:  Procedure Laterality Date   AUGMENTATION MAMMAPLASTY Bilateral    BACK SURGERY     CARDIAC CATHETERIZATION N/A 05/07/2016   Procedure: Left Heart Cath and Coronary Angiography;  Surgeon: Burnell Blanks, MD;  Location: [place] [facility];  Service: Cardiovascular;  Laterality: N/A;   CARDIOVERSION N/A 09/24/2017   Procedure: CARDIOVERSION;  Surgeon: Larey Dresser, MD;  Location: [facility] ENDOSCOPY;  Service: Cardiovascular;  Laterality: N/A;   DILATION AND CURETTAGE OF UTERUS     ENDARTERECTOMY FEMORAL Right 03/02/2015   Procedure: ENDARTERECTOMY RIGHT FEMORAL;  Surgeon: Angelia Mould, MD;  Location: [facility] OR;  Service: Vascular;  Laterality: Right;   ESOPHAGOGASTRODUODENOSCOPY (EGD) WITH PROPOFOL N/A 12/01/2020   Procedure: ESOPHAGOGASTRODUODENOSCOPY (EGD) WITH PROPOFOL;  Surgeon: Jesusita Oka, MD;  Location: [place] [facility]COPY;  Service: General;  Laterality: N/A;   FACIAL COSMETIC SURGERY Left 2002   "related to [place] @ age 9; left eye/side of face droopy; tried to make area symmetrical"   FEMORAL-POPLITEAL BYPASS GRAFT Right 03/02/2015   Procedure: BYPASS GRAFT FEMORAL-BELOW KNEE POPLITEAL ARTERY;  Surgeon: Angelia Mould, MD;  Location: [place];  Service: Vascular;  Laterality: Right;   INGUINAL HERNIA REPAIR Bilateral 2002   IR ANGIO INTRA EXTRACRAN SEL COM CAROTID INNOMINATE UNI L MOD SED  11/16/2020   IR CT HEAD  LTD  11/13/2020   IR PERCUTANEOUS ART THROMBECTOMY/INFUSION INTRACRANIAL INC DIAG ANGIO  11/13/2020   OVARIAN CYST REMOVAL Left    PEG PLACEMENT N/A 12/01/2020   Procedure: PERCUTANEOUS ENDOSCOPIC GASTROSTOMY (PEG) PLACEMENT;  Surgeon: Jesusita Oka, MD;  Location: [place] [facility]OSCOPY;  Service: General;  Laterality: N/A;   PERIPHERAL VASCULAR CATHETERIZATION N/A 01/16/2015   Procedure: Abdominal Aortogram;  Surgeon: Angelia Mould, MD;  Location: [place] [facility];  Service: Cardiovascular;  Laterality: N/A;   POSTERIOR LUMBAR FUSION  2015   "have plates and screws in there"   [place] N/A 11/13/2020   Procedure: IR WITH ANESTHESIA;  Surgeon: Radiologist, Medication, MD;  Location: [place];  Service: Radiology;  Laterality: N/A;   TONSILLECTOMY      Allergies  Allergen Reactions   Amoxicillin Other (See Comments)    UTI Has patient had a PCN reaction causing immediate rash, facial/tongue/throat swelling, SOB or lightheadedness with hypotension: No Has patient had a PCN reaction causing severe rash involving mucus membranes or skin necrosis: No Has patient had a PCN reaction that required hospitalization: No Has patient had a PCN reaction occurring within the last 10 years: Yes--UTI ONLY If all of the above answers are "NO", then may proceed with Cephalosporin use.    Atenolol Cough   Crestor [Rosuvastatin Calcium] Other (See Comments)    Muscle aches   Pravastatin Other (See Comments)    Muscle aches   Sulfa Antibiotics Nausea Only   Codeine Other (See Comments)    hallucinations    Outpatient Encounter Medications as of 03/26/2021  Medication Sig   acetaminophen (TYLENOL) 325 MG tablet Take 650 mg by mouth every 4 (four) hours as needed for mild pain.   amiodarone (PACERONE) 200 MG tablet Place 1 tablet (200 mg total) into feeding tube daily.   amLODipine (NORVASC) 5 MG tablet Take 5 mg by mouth daily. In the morning 8-11 am   apixaban (ELIQUIS) 5 MG TABS tablet  Place 1 tablet (5 mg total) into feeding tube 2 (two) times daily.   buPROPion ER (WELLBUTRIN SR) 100 MG 12 hr tablet Take 100 mg by mouth 2 (two) times daily.   ezetimibe (ZETIA) 10 MG tablet Place 1 tablet (10 mg total) into feeding tube daily.   furosemide (LASIX) 20 MG tablet Take 1 tablet (20 mg total) by mouth daily.   hydrALAZINE (APRESOLINE) 10 MG tablet Take 10 mg by mouth 3 (three) times daily as needed. SBP>165 or DBP >95   ipratropium-albuterol (DUONEB) 0.5-2.5 (3) MG/3ML SOLN Take 3 mLs by nebulization every 4 (four) hours as needed.   metoprolol tartrate (LOPRESSOR) 50 MG tablet Place 1 tablet (50 mg total) into feeding tube 2 (two) times daily.   nystatin cream (MYCOSTATIN) Apply 1 application topically 2 (two) times daily as needed for dry skin (apply to perirectal rash).   pantoprazole sodium (PROTONIX) 40 mg/20 mL SUSP Place 40 mg into feeding  tube 2 (two) times daily.   protein supplement (PROSOURCE NO CARB) LIQD Place 45 mLs into feeding tube 2 (two) times daily.   traZODone (DESYREL) 50 MG tablet Take 25 mg by mouth as needed for sleep.   Water For Irrigation, Sterile (FREE WATER) SOLN Place 250 mLs into feeding tube 6 (six) times daily.   zinc oxide 20 % ointment Apply 1 application topically as needed for irritation (apply to perirectal area prn - use with nystatin).   [DISCONTINUED] budesonide (PULMICORT) 0.5 MG/2ML nebulizer solution Take 2 mLs (0.5 mg total) by nebulization 2 (two) times daily.   [DISCONTINUED] melatonin 5 MG TABS Take 5 mg by mouth at bedtime.   No facility-administered encounter medications on file as of 03/26/2021.    Review of Systems:  Review of Systems  Constitutional:  Negative for activity change and appetite change.  HENT: Negative.    Respiratory:  Negative for cough and shortness of breath.   Cardiovascular:  Negative for leg swelling.  Gastrointestinal:  Negative for constipation.  Genitourinary: Negative.   Musculoskeletal:  Positive  for gait problem. Negative for arthralgias and myalgias.  Skin: Negative.   Neurological:  Positive for weakness. Negative for dizziness.  Psychiatric/Behavioral:  Negative for confusion, dysphoric mood and sleep disturbance.    Health Maintenance  Topic Date Due   Hepatitis C Screening  Never done   Zoster Vaccines- Shingrix (1 of 2) Never done   DEXA SCAN  Never done   INFLUENZA VACCINE  11/13/2020   COVID-19 Vaccine (5 - Booster for Pfizer series) 11/13/2020   COLONOSCOPY (Pts 45-16yrs Insurance coverage will need to be confirmed)  02/13/2022 (Originally 04/24/1990)   TETANUS/TDAP  05/20/2023   Pneumonia Vaccine 58+ Years old  Completed   HPV VACCINES  Aged Out    Physical Exam: Vitals:   03/26/21 1358  BP: 132/90  Pulse: (!) 54  Resp: 18  Temp: 98.5 F (36.9 C)  SpO2: 93%  Weight: 98 lb (44.5 kg)   Body mass index is 17.36 kg/m. Physical Exam Vitals reviewed.  Constitutional:      Appearance: Normal appearance.  HENT:     Head: Normocephalic.     Nose: Nose normal.     Mouth/Throat:     Mouth: Mucous membranes are moist.     Pharynx: Oropharynx is clear.  Eyes:     Pupils: Pupils are equal, round, and reactive to light.  Cardiovascular:     Rate and Rhythm: Normal rate and regular rhythm.     Pulses: Normal pulses.     Heart sounds: Normal heart sounds. No murmur heard. Pulmonary:     Effort: Pulmonary effort is normal.     Breath sounds: Normal breath sounds.  Abdominal:     General: Abdomen is flat. Bowel sounds are normal.     Palpations: Abdomen is soft.  Musculoskeletal:        General: No swelling.     Cervical back: Neck supple.  Skin:    General: Skin is warm.  Neurological:     Mental Status: She is alert.     Comments: Speech has improved from before Continues to have Right Hemiparesis with Facial droop  Psychiatric:        Mood and Affect: Mood normal.        Thought Content: Thought content normal.    Labs reviewed: Basic Metabolic  Panel: Recent Labs    11/18/20 0100 11/19/20 0344 11/29/20 0128 11/30/20 [id number] 12/02/20 0216 12/04/20 0052 01/22/21  5638 01/23/21 0259 01/30/21 0000 02/02/21 0519  NA 142   < > 151*   < > 140   < > 138 135 143 139  K 4.3   < > 4.4   < > 4.2   < > 3.9 3.6 4.0 3.5  CL 111   < > 113*   < > 108   < > 98 94* 99 100  CO2 25   < > 30   < > 25   < >  --  28 31* 30  GLUCOSE 154*   < > 140*   < > 181*   < > 106* 85  --  114*  BUN 32*   < > 60*   < > 51*   < > 19 14 13  13 21   CREATININE 0.62   < > 0.75   < > 0.79   < > 0.90 0.85 0.7  0.7 0.72  CALCIUM 8.5*   < > 8.9   < > 8.4*   < >  --  8.7* 9.6  9.6 8.9  MG  --    < > 2.3  --  2.2  --   --  2.0  --   --   PHOS 2.1*  --  3.4  --  3.0  --   --  4.1  --   --   TSH 1.087  --   --   --   --   --   --   --   --   --    < > = values in this interval not displayed.   Liver Function Tests: Recent Labs    12/22/20 0409 01/22/21 0651 01/23/21 0259  AST 35 39 28  ALT 47* 38 34  ALKPHOS 69 68 57  BILITOT 0.6 1.3* 1.5*  PROT 5.6* 6.1* 5.6*  ALBUMIN 2.6* 3.4* 3.1*   No results for input(s): LIPASE, AMYLASE in the last 8760 hours. No results for input(s): AMMONIA in the last 8760 hours. CBC: Recent Labs    01/15/21 0603 01/22/21 0651 01/22/21 0658 01/23/21 0259 01/30/21 0000 02/02/21 0519  WBC 8.8 10.7*  --  8.9 9.3 9.3  NEUTROABS 5.5 7.7  --   --   --  7.2  HGB 15.3* 15.5*   < > 14.5 15.1 14.4  HCT 47.5* 48.4*   < > 45.8 48* 45.3  MCV 95.2 96.4  --  95.0  --  95.4  PLT 234 237  --  195 261 286   < > = values in this interval not displayed.   Lipid Panel: Recent Labs    11/13/20 1930  CHOL 209*  HDL 70  LDLCALC 120*  TRIG 97  CHOLHDL 3.0   Lab Results  Component Value Date   HGBA1C 6.0 (H) 11/13/2020    Procedures since last visit: No results found.  Assessment/Plan Transient alteration of awareness ? Etiology D/w the Husband differential Could be TIA, Or Orthostatic Hypotansion  Will Check For infection  and Electrolytes CBC,CMP Neuro checks If anything changes send to Hospital. 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, unspecified laterality (HCC) Continues to work with Therapy Able to lift her leg some when walking with them Aphasia has improved also  Obstructive hypertrophic cardiomyopathy (HCC) Follows with Cardiology On Lasix PRN Paroxysmal atrial fibrillation (HCC) On Amiodarone and Lopressor Also on Eliquis Essential hypertension Passive control due to her history of  Hypotensions Stable on Norvasc and Lopressor Simple chronic bronchitis (HCC) On Oxygen now  Dysphagia, post-stroke Nectar Thick Gets Water and Protein from the Tube Depression, recurrent ([place]) On Well butrin Mixed hyperlipidemia Continues Zetia GERD On Protonix   Labs/tests ordered:  CBC,CMP Next appt:  Visit date not found  Total time spent in this patient care encounter was  45_  minutes; greater than 50% of the visit spent counseling patient husband and staff, reviewing records , Labs and coordinating care for problems addressed at this encounter.

## 2021-03-27 DIAGNOSIS — R293 Abnormal posture: Secondary | ICD-10-CM | POA: Diagnosis not present

## 2021-03-27 DIAGNOSIS — R278 Other lack of coordination: Secondary | ICD-10-CM | POA: Diagnosis not present

## 2021-03-27 DIAGNOSIS — I69351 Hemiplegia and hemiparesis following cerebral infarction affecting right dominant side: Secondary | ICD-10-CM | POA: Diagnosis not present

## 2021-03-27 DIAGNOSIS — I63312 Cerebral infarction due to thrombosis of left middle cerebral artery: Secondary | ICD-10-CM | POA: Diagnosis not present

## 2021-03-27 DIAGNOSIS — R2689 Other abnormalities of gait and mobility: Secondary | ICD-10-CM | POA: Diagnosis not present

## 2021-03-27 DIAGNOSIS — I69320 Aphasia following cerebral infarction: Secondary | ICD-10-CM | POA: Diagnosis not present

## 2021-03-27 DIAGNOSIS — I69390 Apraxia following cerebral infarction: Secondary | ICD-10-CM | POA: Diagnosis not present

## 2021-03-27 DIAGNOSIS — M6389 Disorders of muscle in diseases classified elsewhere, multiple sites: Secondary | ICD-10-CM | POA: Diagnosis not present

## 2021-03-27 DIAGNOSIS — I69391 Dysphagia following cerebral infarction: Secondary | ICD-10-CM | POA: Diagnosis not present

## 2021-03-27 DIAGNOSIS — I6602 Occlusion and stenosis of left middle cerebral artery: Secondary | ICD-10-CM | POA: Diagnosis not present

## 2021-03-28 DIAGNOSIS — M62561 Muscle wasting and atrophy, not elsewhere classified, right lower leg: Secondary | ICD-10-CM | POA: Diagnosis not present

## 2021-03-28 DIAGNOSIS — I69390 Apraxia following cerebral infarction: Secondary | ICD-10-CM | POA: Diagnosis not present

## 2021-03-28 DIAGNOSIS — R2689 Other abnormalities of gait and mobility: Secondary | ICD-10-CM | POA: Diagnosis not present

## 2021-03-28 DIAGNOSIS — I69351 Hemiplegia and hemiparesis following cerebral infarction affecting right dominant side: Secondary | ICD-10-CM | POA: Diagnosis not present

## 2021-03-28 DIAGNOSIS — R278 Other lack of coordination: Secondary | ICD-10-CM | POA: Diagnosis not present

## 2021-03-28 DIAGNOSIS — I69391 Dysphagia following cerebral infarction: Secondary | ICD-10-CM | POA: Diagnosis not present

## 2021-03-28 DIAGNOSIS — I69320 Aphasia following cerebral infarction: Secondary | ICD-10-CM | POA: Diagnosis not present

## 2021-03-28 DIAGNOSIS — I63312 Cerebral infarction due to thrombosis of left middle cerebral artery: Secondary | ICD-10-CM | POA: Diagnosis not present

## 2021-03-28 DIAGNOSIS — I6602 Occlusion and stenosis of left middle cerebral artery: Secondary | ICD-10-CM | POA: Diagnosis not present

## 2021-03-29 DIAGNOSIS — I69390 Apraxia following cerebral infarction: Secondary | ICD-10-CM | POA: Diagnosis not present

## 2021-03-29 DIAGNOSIS — R2689 Other abnormalities of gait and mobility: Secondary | ICD-10-CM | POA: Diagnosis not present

## 2021-03-29 DIAGNOSIS — I6602 Occlusion and stenosis of left middle cerebral artery: Secondary | ICD-10-CM | POA: Diagnosis not present

## 2021-03-29 DIAGNOSIS — M62561 Muscle wasting and atrophy, not elsewhere classified, right lower leg: Secondary | ICD-10-CM | POA: Diagnosis not present

## 2021-03-29 DIAGNOSIS — I69320 Aphasia following cerebral infarction: Secondary | ICD-10-CM | POA: Diagnosis not present

## 2021-03-29 DIAGNOSIS — I63312 Cerebral infarction due to thrombosis of left middle cerebral artery: Secondary | ICD-10-CM | POA: Diagnosis not present

## 2021-03-29 DIAGNOSIS — R278 Other lack of coordination: Secondary | ICD-10-CM | POA: Diagnosis not present

## 2021-03-29 DIAGNOSIS — I69391 Dysphagia following cerebral infarction: Secondary | ICD-10-CM | POA: Diagnosis not present

## 2021-03-29 DIAGNOSIS — I69351 Hemiplegia and hemiparesis following cerebral infarction affecting right dominant side: Secondary | ICD-10-CM | POA: Diagnosis not present

## 2021-03-29 DIAGNOSIS — R293 Abnormal posture: Secondary | ICD-10-CM | POA: Diagnosis not present

## 2021-03-29 DIAGNOSIS — M6389 Disorders of muscle in diseases classified elsewhere, multiple sites: Secondary | ICD-10-CM | POA: Diagnosis not present

## 2021-03-30 DIAGNOSIS — R278 Other lack of coordination: Secondary | ICD-10-CM | POA: Diagnosis not present

## 2021-03-30 DIAGNOSIS — M62561 Muscle wasting and atrophy, not elsewhere classified, right lower leg: Secondary | ICD-10-CM | POA: Diagnosis not present

## 2021-03-30 DIAGNOSIS — I69351 Hemiplegia and hemiparesis following cerebral infarction affecting right dominant side: Secondary | ICD-10-CM | POA: Diagnosis not present

## 2021-03-30 DIAGNOSIS — I63312 Cerebral infarction due to thrombosis of left middle cerebral artery: Secondary | ICD-10-CM | POA: Diagnosis not present

## 2021-03-30 DIAGNOSIS — R2689 Other abnormalities of gait and mobility: Secondary | ICD-10-CM | POA: Diagnosis not present

## 2021-04-02 DIAGNOSIS — M62561 Muscle wasting and atrophy, not elsewhere classified, right lower leg: Secondary | ICD-10-CM | POA: Diagnosis not present

## 2021-04-02 DIAGNOSIS — R293 Abnormal posture: Secondary | ICD-10-CM | POA: Diagnosis not present

## 2021-04-02 DIAGNOSIS — I69390 Apraxia following cerebral infarction: Secondary | ICD-10-CM | POA: Diagnosis not present

## 2021-04-02 DIAGNOSIS — M6389 Disorders of muscle in diseases classified elsewhere, multiple sites: Secondary | ICD-10-CM | POA: Diagnosis not present

## 2021-04-02 DIAGNOSIS — R278 Other lack of coordination: Secondary | ICD-10-CM | POA: Diagnosis not present

## 2021-04-02 DIAGNOSIS — R2689 Other abnormalities of gait and mobility: Secondary | ICD-10-CM | POA: Diagnosis not present

## 2021-04-02 DIAGNOSIS — I69391 Dysphagia following cerebral infarction: Secondary | ICD-10-CM | POA: Diagnosis not present

## 2021-04-02 DIAGNOSIS — I69320 Aphasia following cerebral infarction: Secondary | ICD-10-CM | POA: Diagnosis not present

## 2021-04-02 DIAGNOSIS — I63312 Cerebral infarction due to thrombosis of left middle cerebral artery: Secondary | ICD-10-CM | POA: Diagnosis not present

## 2021-04-02 DIAGNOSIS — I69351 Hemiplegia and hemiparesis following cerebral infarction affecting right dominant side: Secondary | ICD-10-CM | POA: Diagnosis not present

## 2021-04-02 DIAGNOSIS — I6602 Occlusion and stenosis of left middle cerebral artery: Secondary | ICD-10-CM | POA: Diagnosis not present

## 2021-04-03 DIAGNOSIS — M6389 Disorders of muscle in diseases classified elsewhere, multiple sites: Secondary | ICD-10-CM | POA: Diagnosis not present

## 2021-04-03 DIAGNOSIS — R293 Abnormal posture: Secondary | ICD-10-CM | POA: Diagnosis not present

## 2021-04-03 DIAGNOSIS — I63312 Cerebral infarction due to thrombosis of left middle cerebral artery: Secondary | ICD-10-CM | POA: Diagnosis not present

## 2021-04-03 DIAGNOSIS — R278 Other lack of coordination: Secondary | ICD-10-CM | POA: Diagnosis not present

## 2021-04-03 DIAGNOSIS — I69351 Hemiplegia and hemiparesis following cerebral infarction affecting right dominant side: Secondary | ICD-10-CM | POA: Diagnosis not present

## 2021-04-04 DIAGNOSIS — I63312 Cerebral infarction due to thrombosis of left middle cerebral artery: Secondary | ICD-10-CM | POA: Diagnosis not present

## 2021-04-04 DIAGNOSIS — I69351 Hemiplegia and hemiparesis following cerebral infarction affecting right dominant side: Secondary | ICD-10-CM | POA: Diagnosis not present

## 2021-04-04 DIAGNOSIS — I69391 Dysphagia following cerebral infarction: Secondary | ICD-10-CM | POA: Diagnosis not present

## 2021-04-04 DIAGNOSIS — R278 Other lack of coordination: Secondary | ICD-10-CM | POA: Diagnosis not present

## 2021-04-04 DIAGNOSIS — M62561 Muscle wasting and atrophy, not elsewhere classified, right lower leg: Secondary | ICD-10-CM | POA: Diagnosis not present

## 2021-04-04 DIAGNOSIS — M6389 Disorders of muscle in diseases classified elsewhere, multiple sites: Secondary | ICD-10-CM | POA: Diagnosis not present

## 2021-04-04 DIAGNOSIS — I6602 Occlusion and stenosis of left middle cerebral artery: Secondary | ICD-10-CM | POA: Diagnosis not present

## 2021-04-04 DIAGNOSIS — R293 Abnormal posture: Secondary | ICD-10-CM | POA: Diagnosis not present

## 2021-04-04 DIAGNOSIS — I69390 Apraxia following cerebral infarction: Secondary | ICD-10-CM | POA: Diagnosis not present

## 2021-04-04 DIAGNOSIS — R2689 Other abnormalities of gait and mobility: Secondary | ICD-10-CM | POA: Diagnosis not present

## 2021-04-04 DIAGNOSIS — I69320 Aphasia following cerebral infarction: Secondary | ICD-10-CM | POA: Diagnosis not present

## 2021-04-05 DIAGNOSIS — R293 Abnormal posture: Secondary | ICD-10-CM | POA: Diagnosis not present

## 2021-04-05 DIAGNOSIS — R278 Other lack of coordination: Secondary | ICD-10-CM | POA: Diagnosis not present

## 2021-04-05 DIAGNOSIS — M6389 Disorders of muscle in diseases classified elsewhere, multiple sites: Secondary | ICD-10-CM | POA: Diagnosis not present

## 2021-04-05 DIAGNOSIS — I69320 Aphasia following cerebral infarction: Secondary | ICD-10-CM | POA: Diagnosis not present

## 2021-04-05 DIAGNOSIS — I6602 Occlusion and stenosis of left middle cerebral artery: Secondary | ICD-10-CM | POA: Diagnosis not present

## 2021-04-05 DIAGNOSIS — I69391 Dysphagia following cerebral infarction: Secondary | ICD-10-CM | POA: Diagnosis not present

## 2021-04-05 DIAGNOSIS — I69351 Hemiplegia and hemiparesis following cerebral infarction affecting right dominant side: Secondary | ICD-10-CM | POA: Diagnosis not present

## 2021-04-05 DIAGNOSIS — I69390 Apraxia following cerebral infarction: Secondary | ICD-10-CM | POA: Diagnosis not present

## 2021-04-05 DIAGNOSIS — I63312 Cerebral infarction due to thrombosis of left middle cerebral artery: Secondary | ICD-10-CM | POA: Diagnosis not present

## 2021-04-10 DIAGNOSIS — M6389 Disorders of muscle in diseases classified elsewhere, multiple sites: Secondary | ICD-10-CM | POA: Diagnosis not present

## 2021-04-10 DIAGNOSIS — I63312 Cerebral infarction due to thrombosis of left middle cerebral artery: Secondary | ICD-10-CM | POA: Diagnosis not present

## 2021-04-10 DIAGNOSIS — I69351 Hemiplegia and hemiparesis following cerebral infarction affecting right dominant side: Secondary | ICD-10-CM | POA: Diagnosis not present

## 2021-04-10 DIAGNOSIS — R278 Other lack of coordination: Secondary | ICD-10-CM | POA: Diagnosis not present

## 2021-04-10 DIAGNOSIS — R293 Abnormal posture: Secondary | ICD-10-CM | POA: Diagnosis not present

## 2021-04-11 DIAGNOSIS — I69351 Hemiplegia and hemiparesis following cerebral infarction affecting right dominant side: Secondary | ICD-10-CM | POA: Diagnosis not present

## 2021-04-11 DIAGNOSIS — M6389 Disorders of muscle in diseases classified elsewhere, multiple sites: Secondary | ICD-10-CM | POA: Diagnosis not present

## 2021-04-11 DIAGNOSIS — I63312 Cerebral infarction due to thrombosis of left middle cerebral artery: Secondary | ICD-10-CM | POA: Diagnosis not present

## 2021-04-11 DIAGNOSIS — R293 Abnormal posture: Secondary | ICD-10-CM | POA: Diagnosis not present

## 2021-04-11 DIAGNOSIS — R278 Other lack of coordination: Secondary | ICD-10-CM | POA: Diagnosis not present

## 2021-04-12 DIAGNOSIS — M6389 Disorders of muscle in diseases classified elsewhere, multiple sites: Secondary | ICD-10-CM | POA: Diagnosis not present

## 2021-04-12 DIAGNOSIS — I69351 Hemiplegia and hemiparesis following cerebral infarction affecting right dominant side: Secondary | ICD-10-CM | POA: Diagnosis not present

## 2021-04-12 DIAGNOSIS — R278 Other lack of coordination: Secondary | ICD-10-CM | POA: Diagnosis not present

## 2021-04-12 DIAGNOSIS — I63312 Cerebral infarction due to thrombosis of left middle cerebral artery: Secondary | ICD-10-CM | POA: Diagnosis not present

## 2021-04-12 DIAGNOSIS — R293 Abnormal posture: Secondary | ICD-10-CM | POA: Diagnosis not present

## 2021-04-13 DIAGNOSIS — R293 Abnormal posture: Secondary | ICD-10-CM | POA: Diagnosis not present

## 2021-04-13 DIAGNOSIS — I69351 Hemiplegia and hemiparesis following cerebral infarction affecting right dominant side: Secondary | ICD-10-CM | POA: Diagnosis not present

## 2021-04-13 DIAGNOSIS — M6389 Disorders of muscle in diseases classified elsewhere, multiple sites: Secondary | ICD-10-CM | POA: Diagnosis not present

## 2021-04-13 DIAGNOSIS — R278 Other lack of coordination: Secondary | ICD-10-CM | POA: Diagnosis not present

## 2021-04-13 DIAGNOSIS — I63312 Cerebral infarction due to thrombosis of left middle cerebral artery: Secondary | ICD-10-CM | POA: Diagnosis not present

## 2021-04-16 DIAGNOSIS — I69391 Dysphagia following cerebral infarction: Secondary | ICD-10-CM | POA: Diagnosis not present

## 2021-04-16 DIAGNOSIS — I63312 Cerebral infarction due to thrombosis of left middle cerebral artery: Secondary | ICD-10-CM | POA: Diagnosis not present

## 2021-04-16 DIAGNOSIS — I6602 Occlusion and stenosis of left middle cerebral artery: Secondary | ICD-10-CM | POA: Diagnosis not present

## 2021-04-16 DIAGNOSIS — R2689 Other abnormalities of gait and mobility: Secondary | ICD-10-CM | POA: Diagnosis not present

## 2021-04-16 DIAGNOSIS — R278 Other lack of coordination: Secondary | ICD-10-CM | POA: Diagnosis not present

## 2021-04-16 DIAGNOSIS — M62561 Muscle wasting and atrophy, not elsewhere classified, right lower leg: Secondary | ICD-10-CM | POA: Diagnosis not present

## 2021-04-16 DIAGNOSIS — I69351 Hemiplegia and hemiparesis following cerebral infarction affecting right dominant side: Secondary | ICD-10-CM | POA: Diagnosis not present

## 2021-04-16 DIAGNOSIS — I69320 Aphasia following cerebral infarction: Secondary | ICD-10-CM | POA: Diagnosis not present

## 2021-04-16 DIAGNOSIS — R293 Abnormal posture: Secondary | ICD-10-CM | POA: Diagnosis not present

## 2021-04-16 DIAGNOSIS — I69390 Apraxia following cerebral infarction: Secondary | ICD-10-CM | POA: Diagnosis not present

## 2021-04-16 DIAGNOSIS — M6389 Disorders of muscle in diseases classified elsewhere, multiple sites: Secondary | ICD-10-CM | POA: Diagnosis not present

## 2021-04-17 DIAGNOSIS — M6389 Disorders of muscle in diseases classified elsewhere, multiple sites: Secondary | ICD-10-CM | POA: Diagnosis not present

## 2021-04-17 DIAGNOSIS — R293 Abnormal posture: Secondary | ICD-10-CM | POA: Diagnosis not present

## 2021-04-17 DIAGNOSIS — R278 Other lack of coordination: Secondary | ICD-10-CM | POA: Diagnosis not present

## 2021-04-17 DIAGNOSIS — I69351 Hemiplegia and hemiparesis following cerebral infarction affecting right dominant side: Secondary | ICD-10-CM | POA: Diagnosis not present

## 2021-04-17 DIAGNOSIS — I63312 Cerebral infarction due to thrombosis of left middle cerebral artery: Secondary | ICD-10-CM | POA: Diagnosis not present

## 2021-04-18 DIAGNOSIS — I69351 Hemiplegia and hemiparesis following cerebral infarction affecting right dominant side: Secondary | ICD-10-CM | POA: Diagnosis not present

## 2021-04-18 DIAGNOSIS — M62561 Muscle wasting and atrophy, not elsewhere classified, right lower leg: Secondary | ICD-10-CM | POA: Diagnosis not present

## 2021-04-18 DIAGNOSIS — R2689 Other abnormalities of gait and mobility: Secondary | ICD-10-CM | POA: Diagnosis not present

## 2021-04-18 DIAGNOSIS — I63312 Cerebral infarction due to thrombosis of left middle cerebral artery: Secondary | ICD-10-CM | POA: Diagnosis not present

## 2021-04-18 DIAGNOSIS — R278 Other lack of coordination: Secondary | ICD-10-CM | POA: Diagnosis not present

## 2021-04-19 DIAGNOSIS — I69320 Aphasia following cerebral infarction: Secondary | ICD-10-CM | POA: Diagnosis not present

## 2021-04-19 DIAGNOSIS — I6602 Occlusion and stenosis of left middle cerebral artery: Secondary | ICD-10-CM | POA: Diagnosis not present

## 2021-04-19 DIAGNOSIS — I69391 Dysphagia following cerebral infarction: Secondary | ICD-10-CM | POA: Diagnosis not present

## 2021-04-19 DIAGNOSIS — I69390 Apraxia following cerebral infarction: Secondary | ICD-10-CM | POA: Diagnosis not present

## 2021-04-23 DIAGNOSIS — R2689 Other abnormalities of gait and mobility: Secondary | ICD-10-CM | POA: Diagnosis not present

## 2021-04-23 DIAGNOSIS — I63312 Cerebral infarction due to thrombosis of left middle cerebral artery: Secondary | ICD-10-CM | POA: Diagnosis not present

## 2021-04-23 DIAGNOSIS — R278 Other lack of coordination: Secondary | ICD-10-CM | POA: Diagnosis not present

## 2021-04-23 DIAGNOSIS — I69391 Dysphagia following cerebral infarction: Secondary | ICD-10-CM | POA: Diagnosis not present

## 2021-04-23 DIAGNOSIS — I69320 Aphasia following cerebral infarction: Secondary | ICD-10-CM | POA: Diagnosis not present

## 2021-04-23 DIAGNOSIS — I6602 Occlusion and stenosis of left middle cerebral artery: Secondary | ICD-10-CM | POA: Diagnosis not present

## 2021-04-23 DIAGNOSIS — M62561 Muscle wasting and atrophy, not elsewhere classified, right lower leg: Secondary | ICD-10-CM | POA: Diagnosis not present

## 2021-04-23 DIAGNOSIS — I69351 Hemiplegia and hemiparesis following cerebral infarction affecting right dominant side: Secondary | ICD-10-CM | POA: Diagnosis not present

## 2021-04-23 DIAGNOSIS — I69390 Apraxia following cerebral infarction: Secondary | ICD-10-CM | POA: Diagnosis not present

## 2021-04-24 DIAGNOSIS — M6389 Disorders of muscle in diseases classified elsewhere, multiple sites: Secondary | ICD-10-CM | POA: Diagnosis not present

## 2021-04-24 DIAGNOSIS — I63312 Cerebral infarction due to thrombosis of left middle cerebral artery: Secondary | ICD-10-CM | POA: Diagnosis not present

## 2021-04-24 DIAGNOSIS — R278 Other lack of coordination: Secondary | ICD-10-CM | POA: Diagnosis not present

## 2021-04-24 DIAGNOSIS — I69351 Hemiplegia and hemiparesis following cerebral infarction affecting right dominant side: Secondary | ICD-10-CM | POA: Diagnosis not present

## 2021-04-24 DIAGNOSIS — R293 Abnormal posture: Secondary | ICD-10-CM | POA: Diagnosis not present

## 2021-04-25 DIAGNOSIS — I69391 Dysphagia following cerebral infarction: Secondary | ICD-10-CM | POA: Diagnosis not present

## 2021-04-25 DIAGNOSIS — I69351 Hemiplegia and hemiparesis following cerebral infarction affecting right dominant side: Secondary | ICD-10-CM | POA: Diagnosis not present

## 2021-04-25 DIAGNOSIS — I69390 Apraxia following cerebral infarction: Secondary | ICD-10-CM | POA: Diagnosis not present

## 2021-04-25 DIAGNOSIS — R2689 Other abnormalities of gait and mobility: Secondary | ICD-10-CM | POA: Diagnosis not present

## 2021-04-25 DIAGNOSIS — I69320 Aphasia following cerebral infarction: Secondary | ICD-10-CM | POA: Diagnosis not present

## 2021-04-25 DIAGNOSIS — M6389 Disorders of muscle in diseases classified elsewhere, multiple sites: Secondary | ICD-10-CM | POA: Diagnosis not present

## 2021-04-25 DIAGNOSIS — I63312 Cerebral infarction due to thrombosis of left middle cerebral artery: Secondary | ICD-10-CM | POA: Diagnosis not present

## 2021-04-25 DIAGNOSIS — I6602 Occlusion and stenosis of left middle cerebral artery: Secondary | ICD-10-CM | POA: Diagnosis not present

## 2021-04-25 DIAGNOSIS — R293 Abnormal posture: Secondary | ICD-10-CM | POA: Diagnosis not present

## 2021-04-25 DIAGNOSIS — M62561 Muscle wasting and atrophy, not elsewhere classified, right lower leg: Secondary | ICD-10-CM | POA: Diagnosis not present

## 2021-04-25 DIAGNOSIS — R278 Other lack of coordination: Secondary | ICD-10-CM | POA: Diagnosis not present

## 2021-04-27 ENCOUNTER — Non-Acute Institutional Stay (SKILLED_NURSING_FACILITY): Payer: Medicare Other | Admitting: Internal Medicine

## 2021-04-27 DIAGNOSIS — I69359 Hemiplegia and hemiparesis following cerebral infarction affecting unspecified side: Secondary | ICD-10-CM

## 2021-04-27 DIAGNOSIS — M6389 Disorders of muscle in diseases classified elsewhere, multiple sites: Secondary | ICD-10-CM | POA: Diagnosis not present

## 2021-04-27 DIAGNOSIS — R293 Abnormal posture: Secondary | ICD-10-CM | POA: Diagnosis not present

## 2021-04-27 DIAGNOSIS — M62561 Muscle wasting and atrophy, not elsewhere classified, right lower leg: Secondary | ICD-10-CM | POA: Diagnosis not present

## 2021-04-27 DIAGNOSIS — I421 Obstructive hypertrophic cardiomyopathy: Secondary | ICD-10-CM | POA: Diagnosis not present

## 2021-04-27 DIAGNOSIS — R6889 Other general symptoms and signs: Secondary | ICD-10-CM

## 2021-04-27 DIAGNOSIS — I63312 Cerebral infarction due to thrombosis of left middle cerebral artery: Secondary | ICD-10-CM | POA: Diagnosis not present

## 2021-04-27 DIAGNOSIS — R278 Other lack of coordination: Secondary | ICD-10-CM | POA: Diagnosis not present

## 2021-04-27 DIAGNOSIS — R2689 Other abnormalities of gait and mobility: Secondary | ICD-10-CM | POA: Diagnosis not present

## 2021-04-27 DIAGNOSIS — I480 Paroxysmal atrial fibrillation: Secondary | ICD-10-CM

## 2021-04-27 DIAGNOSIS — I69320 Aphasia following cerebral infarction: Secondary | ICD-10-CM | POA: Diagnosis not present

## 2021-04-27 DIAGNOSIS — I6602 Occlusion and stenosis of left middle cerebral artery: Secondary | ICD-10-CM | POA: Diagnosis not present

## 2021-04-27 DIAGNOSIS — I69391 Dysphagia following cerebral infarction: Secondary | ICD-10-CM | POA: Diagnosis not present

## 2021-04-27 DIAGNOSIS — I69390 Apraxia following cerebral infarction: Secondary | ICD-10-CM | POA: Diagnosis not present

## 2021-04-27 DIAGNOSIS — I69351 Hemiplegia and hemiparesis following cerebral infarction affecting right dominant side: Secondary | ICD-10-CM | POA: Diagnosis not present

## 2021-04-30 DIAGNOSIS — R293 Abnormal posture: Secondary | ICD-10-CM | POA: Diagnosis not present

## 2021-04-30 DIAGNOSIS — R278 Other lack of coordination: Secondary | ICD-10-CM | POA: Diagnosis not present

## 2021-04-30 DIAGNOSIS — M6389 Disorders of muscle in diseases classified elsewhere, multiple sites: Secondary | ICD-10-CM | POA: Diagnosis not present

## 2021-04-30 DIAGNOSIS — I69390 Apraxia following cerebral infarction: Secondary | ICD-10-CM | POA: Diagnosis not present

## 2021-04-30 DIAGNOSIS — I6602 Occlusion and stenosis of left middle cerebral artery: Secondary | ICD-10-CM | POA: Diagnosis not present

## 2021-04-30 DIAGNOSIS — I63312 Cerebral infarction due to thrombosis of left middle cerebral artery: Secondary | ICD-10-CM | POA: Diagnosis not present

## 2021-04-30 DIAGNOSIS — I69391 Dysphagia following cerebral infarction: Secondary | ICD-10-CM | POA: Diagnosis not present

## 2021-04-30 DIAGNOSIS — R2689 Other abnormalities of gait and mobility: Secondary | ICD-10-CM | POA: Diagnosis not present

## 2021-04-30 DIAGNOSIS — I69351 Hemiplegia and hemiparesis following cerebral infarction affecting right dominant side: Secondary | ICD-10-CM | POA: Diagnosis not present

## 2021-04-30 DIAGNOSIS — I69320 Aphasia following cerebral infarction: Secondary | ICD-10-CM | POA: Diagnosis not present

## 2021-04-30 DIAGNOSIS — M62561 Muscle wasting and atrophy, not elsewhere classified, right lower leg: Secondary | ICD-10-CM | POA: Diagnosis not present

## 2021-04-30 NOTE — Progress Notes (Signed)
Location: [facility] psychologist of Service:  SNF (31)  Provider:   Code Status: Full code Goals of Care:  Advanced Directives 05/10/2021  Does Patient Have a Medical Advance Directive? Yes  Type of Advance Directive Living will  Does patient want to make changes to medical advance directive? No - Patient declined  Copy of [place] in Chart? -  Would patient like information on creating a medical advance directive? -     Chief Complaint  Patient presents with   Acute Visit    HPI: Patient is a 76 y.o. 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 Nurses have cleaned her eyes and they feel much better . They also uses systane. He wanted to know if we should use Patanol She denies any vision complain or pain   Past Medical History:  Diagnosis Date   Anxiety    Arthritis    "some in my lower back; probably elbows, knees" (11/18/2017)   Atrial fibrillation (HCC)    Bell's palsy    when pt. was 76 yrs old, when under stress the left side of face will droop.   Complication of anesthesia    "vascular OR 2016; BP bottomed out; couldn't get it regulated; ended up in ICU for DAYS" (11/18/2017)   GERD (gastroesophageal reflux disease)    History of kidney stones    Hypertension    Hypertrophic cardiomyopathy (HCC)    severe LV basilar hypertrophy witn no evidence of significant outflow tract obstruction, EF  65-70%, mild LAE, mild TR, grade 1a diastolic dysfunction [id number] (Dr. Candee Furbish) (Atrial Septal Hypertrophy pattern)-- Intra-op TEE with dsignificant outflow tract obstruction - AI, MR & TR   Insomnia    Mild aortic sclerosis    Osteopenia    Peripheral vascular disease (HCC)    Syncope    , Vagal    Past Surgical History:  Procedure Laterality Date   AUGMENTATION MAMMAPLASTY Bilateral    BACK SURGERY     CARDIAC CATHETERIZATION N/A 05/07/2016   Procedure: Left Heart Cath and Coronary Angiography;  Surgeon: Burnell Blanks, MD;  Location: [place] [facility];  Service: Cardiovascular;  Laterality: N/A;   CARDIOVERSION N/A 09/24/2017   Procedure: CARDIOVERSION;  Surgeon: Larey Dresser, MD;  Location: [facility] ENDOSCOPY;  Service: Cardiovascular;  Laterality: N/A;   DILATION AND CURETTAGE OF UTERUS     ENDARTERECTOMY FEMORAL Right 03/02/2015   Procedure: ENDARTERECTOMY RIGHT FEMORAL;  Surgeon: Angelia Mould, MD;  Location: [facility] OR;  Service: Vascular;  Laterality: Right;   ESOPHAGOGASTRODUODENOSCOPY (EGD) WITH PROPOFOL N/A 12/01/2020   Procedure: ESOPHAGOGASTRODUODENOSCOPY (EGD) WITH PROPOFOL;  Surgeon: Jesusita Oka, MD;  Location: [place] ENDOSCOPY;  Service: General;  Laterality: N/A;   FACIAL COSMETIC SURGERY Left 2002   "related to Bell's Palsy @ age 37; left eye/side of face droopy; tried to make area symmetrical"   FEMORAL-POPLITEAL BYPASS GRAFT  Right 03/02/2015   Procedure: BYPASS GRAFT FEMORAL-BELOW KNEE POPLITEAL ARTERY;  Surgeon: Angelia Mould, MD;  Location: [place];  Service: Vascular;  Laterality: Right;   INGUINAL HERNIA REPAIR Bilateral 2002   IR ANGIO INTRA EXTRACRAN SEL COM CAROTID INNOMINATE UNI L MOD SED  11/16/2020   IR CT HEAD LTD  11/13/2020   IR PERCUTANEOUS ART THROMBECTOMY/INFUSION INTRACRANIAL INC DIAG ANGIO  11/13/2020   OVARIAN CYST REMOVAL Left    PEG PLACEMENT N/A 12/01/2020   Procedure: PERCUTANEOUS ENDOSCOPIC GASTROSTOMY (PEG) PLACEMENT;  Surgeon:  Jesusita Oka, MD;  Location: [place];  Service: General;  Laterality: N/A;   PERIPHERAL VASCULAR CATHETERIZATION N/A 01/16/2015   Procedure: Abdominal Aortogram;  Surgeon: Angelia Mould, MD;  Location: [place] [facility];  Service: Cardiovascular;  Laterality: N/A;   POSTERIOR LUMBAR FUSION  2015   "have plates and screws in there"   [place] N/A 11/13/2020   Procedure: IR WITH ANESTHESIA;  Surgeon: Radiologist, Medication, MD;  Location: [place];  Service: Radiology;  Laterality: N/A;   TONSILLECTOMY      Allergies  Allergen Reactions   Amoxicillin Other (See Comments)    UTI Has patient had a PCN reaction causing immediate rash, facial/tongue/throat swelling, SOB or lightheadedness with hypotension: No Has patient had a PCN reaction causing severe rash involving mucus membranes or skin necrosis: No Has patient had a PCN reaction that required hospitalization: No Has patient had a PCN reaction occurring within the last 10 years: Yes--UTI ONLY If all of the above answers are "NO", then may proceed with Cephalosporin use.    Atenolol Cough   Crestor [Rosuvastatin Calcium] Other (See Comments)    Muscle aches   Pravastatin Other (See Comments)    Muscle aches   Sulfa Antibiotics Nausea Only   Codeine Other (See Comments)    hallucinations    Outpatient Encounter Medications as of 04/27/2021  Medication Sig   acetaminophen (TYLENOL) 325 MG tablet Take 650 mg by mouth every 4 (four) hours as needed for mild pain.   amiodarone (PACERONE) 200 MG tablet Place 1 tablet (200 mg total) into feeding tube daily.   amLODipine (NORVASC) 5 MG tablet Take 5 mg by mouth daily. In the morning 8-11 am   apixaban (ELIQUIS) 5 MG TABS tablet Place 1 tablet (5 mg total) into feeding tube 2 (two) times daily.   buPROPion ER (WELLBUTRIN SR) 100 MG 12 hr tablet Take 100 mg by mouth 2 (two) times daily.   ezetimibe (ZETIA) 10 MG tablet Place 1 tablet (10 mg total) into feeding  tube daily.   furosemide (LASIX) 20 MG tablet Take 1 tablet (20 mg total) by mouth daily.   hydrALAZINE (APRESOLINE) 10 MG tablet Take 10 mg by mouth 3 (three) times daily as needed. SBP>165 or DBP >95   ipratropium-albuterol (DUONEB) 0.5-2.5 (3) MG/3ML SOLN Take 3 mLs by nebulization every 4 (four) hours as needed.   metoprolol tartrate (LOPRESSOR) 50 MG tablet Place 1 tablet (50 mg total) into feeding tube 2 (two) times daily.   nystatin cream (MYCOSTATIN) Apply 1 application topically 2 (two) times daily as needed for dry skin (apply to perirectal rash).   pantoprazole sodium (PROTONIX) 40 mg/20 mL SUSP Place 40 mg into feeding tube 2 (two) times daily.   Water For Irrigation, Sterile (FREE WATER) SOLN Place 250 mLs into feeding tube 6 (six) times daily.   zinc oxide 20 % ointment Apply 1 application topically as needed for irritation (apply  to perirectal area prn - use with nystatin).   [DISCONTINUED] protein supplement (PROSOURCE NO CARB) LIQD Place 45 mLs into feeding tube 2 (two) times daily.   [DISCONTINUED] traZODone (DESYREL) 50 MG tablet Take 25 mg by mouth as needed for sleep.   No facility-administered encounter medications on file as of 04/27/2021.    Review of Systems:  Review of Systems  Constitutional:  Negative for activity change and appetite change.  HENT: Negative.    Eyes:  Positive for discharge, redness and itching. Negative for pain and visual disturbance.  Respiratory:  Negative for cough and shortness of breath.   Cardiovascular:  Negative for leg swelling.  Gastrointestinal:  Negative for constipation.  Genitourinary: Negative.   Musculoskeletal:  Positive for gait problem. Negative for arthralgias and myalgias.  Skin: Negative.   Neurological:  Positive for weakness. Negative for dizziness.  Psychiatric/Behavioral:  Negative for confusion, dysphoric mood and sleep disturbance.    Health Maintenance  Topic Date Due   Zoster Vaccines- Shingrix (1 of 2) Never  done   Hepatitis C Screening  04/15/2022 (Originally 04/25/1963)   TETANUS/TDAP  05/20/2023   Pneumonia Vaccine 39+ Years old  Completed   INFLUENZA VACCINE  Completed   COVID-19 Vaccine  Completed   HPV VACCINES  Aged Out   DEXA SCAN  Discontinued    Physical Exam: Vitals:   04/27/21 1142  BP: 99/68  Pulse: 80  Resp: 18  Temp: (!) 97.5 F (36.4 C)   There is no height or weight on file to calculate BMI. Physical Exam Vitals reviewed.  Constitutional:      Appearance: Normal appearance.  HENT:     Head: Normocephalic.     Nose: Nose normal.     Mouth/Throat:     Mouth: Mucous membranes are moist.     Pharynx: Oropharynx is clear.  Eyes:     General:        Right eye: No discharge.     Extraocular Movements: Extraocular movements intact.     Conjunctiva/sclera: Conjunctivae normal.     Pupils: Pupils are equal, round, and reactive to light.  Cardiovascular:     Rate and Rhythm: Normal rate and regular rhythm.     Pulses: Normal pulses.     Heart sounds: Normal heart sounds. No murmur heard. Pulmonary:     Effort: Pulmonary effort is normal.     Breath sounds: Normal breath sounds.  Abdominal:     General: Abdomen is flat. Bowel sounds are normal.     Palpations: Abdomen is soft.  Musculoskeletal:        General: No swelling.     Cervical back: Neck supple.  Skin:    General: Skin is warm.  Neurological:     Mental Status: She is alert and oriented to person, place, and time.  Psychiatric:        Mood and Affect: Mood normal.        Thought Content: Thought content normal.    Labs reviewed: Basic Metabolic Panel: Recent Labs    11/18/20 0100 11/19/20 0344 11/29/20 0128 11/30/20 0826 12/02/20 0216 12/04/20 0052 01/22/21 [id number] 01/23/21 0259 01/30/21 0000 02/02/21 0519 03/26/21 0000  NA 142   < > 151*   < > 140   < > 138 135 143 139 136*  K 4.3   < > 4.4   < > 4.2   < > 3.9 3.6 4.0 3.5 3.9  CL 111   < > 113*   < >  108   < > 98 94* 99 100 100  CO2  25   < > 30   < > 25   < >  --  28 31* 30 29*  GLUCOSE 154*   < > 140*   < > 181*   < > 106* 85  --  114*  --   BUN 32*   < > 60*   < > 51*   < > 19 14 13   13 21  32*  CREATININE 0.62   < > 0.75   < > 0.79   < > 0.90 0.85 0.7   0.7 0.72 0.7  CALCIUM 8.5*   < > 8.9   < > 8.4*   < >  --  8.7* 9.6   9.6 8.9 8.9  MG  --    < > 2.3  --  2.2  --   --  2.0  --   --   --   PHOS 2.1*  --  3.4  --  3.0  --   --  4.1  --   --   --   TSH 1.087  --   --   --   --   --   --   --   --   --   --    < > = values in this interval not displayed.   Liver Function Tests: Recent Labs    12/22/20 0409 01/22/21 0651 01/23/21 0259 03/26/21 0000  AST 35 39 28 23  ALT 47* 38 34 31  ALKPHOS 69 68 57 67  BILITOT 0.6 1.3* 1.5*  --   PROT 5.6* 6.1* 5.6*  --   ALBUMIN 2.6* 3.4* 3.1* 3.3*   No results for input(s): LIPASE, AMYLASE in the last 8760 hours. No results for input(s): AMMONIA in the last 8760 hours. CBC: Recent Labs    01/15/21 0603 01/22/21 0651 01/22/21 0658 01/23/21 0259 01/30/21 0000 02/02/21 0519 03/26/21 0000  WBC 8.8 10.7*  --  8.9 9.3 9.3 7.5  NEUTROABS 5.5 7.7  --   --   --  7.2  --   HGB 15.3* 15.5*   < > 14.5 15.1 14.4 14.2  HCT 47.5* 48.4*   < > 45.8 48* 45.3 43  MCV 95.2 96.4  --  95.0  --  95.4  --   PLT 234 237  --  195 261 286 241   < > = values in this interval not displayed.   Lipid Panel: Recent Labs    11/13/20 1930  CHOL 209*  HDL 70  LDLCALC 120*  TRIG 97  CHOLHDL 3.0   Lab Results  Component Value Date   HGBA1C 6.0 (H) 11/13/2020    Procedures since last visit: No results found.  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, unspecified laterality ([place]) Continues to work with Therapy Able to lift her leg some when walking with them Aphasia has improved also   Obstructive  hypertrophic cardiomyopathy ([place]) Follows with Cardiology On Lasix PRN Paroxysmal atrial fibrillation ([place]) On Amiodarone and Lopressor Also on Eliquis Essential hypertension Passive control due to her history of Hypotensions Stable on Norvasc and Lopressor Simple chronic bronchitis (HCC) On Oxygen now   Dysphagia, post-stroke Nectar Thick Gets Water and Protein from the Tube Depression, recurrent ([place]) On Well butrin  Mixed hyperlipidemia Continues Zetia GERD On Protonix    Labs/tests ordered:  * No order type specified * Next appt:  Visit date not found

## 2021-05-01 DIAGNOSIS — I69320 Aphasia following cerebral infarction: Secondary | ICD-10-CM | POA: Diagnosis not present

## 2021-05-01 DIAGNOSIS — I6602 Occlusion and stenosis of left middle cerebral artery: Secondary | ICD-10-CM | POA: Diagnosis not present

## 2021-05-01 DIAGNOSIS — I69391 Dysphagia following cerebral infarction: Secondary | ICD-10-CM | POA: Diagnosis not present

## 2021-05-01 DIAGNOSIS — I69390 Apraxia following cerebral infarction: Secondary | ICD-10-CM | POA: Diagnosis not present

## 2021-05-02 DIAGNOSIS — R2689 Other abnormalities of gait and mobility: Secondary | ICD-10-CM | POA: Diagnosis not present

## 2021-05-02 DIAGNOSIS — R278 Other lack of coordination: Secondary | ICD-10-CM | POA: Diagnosis not present

## 2021-05-02 DIAGNOSIS — I63312 Cerebral infarction due to thrombosis of left middle cerebral artery: Secondary | ICD-10-CM | POA: Diagnosis not present

## 2021-05-02 DIAGNOSIS — I69391 Dysphagia following cerebral infarction: Secondary | ICD-10-CM | POA: Diagnosis not present

## 2021-05-02 DIAGNOSIS — I69320 Aphasia following cerebral infarction: Secondary | ICD-10-CM | POA: Diagnosis not present

## 2021-05-02 DIAGNOSIS — I6602 Occlusion and stenosis of left middle cerebral artery: Secondary | ICD-10-CM | POA: Diagnosis not present

## 2021-05-02 DIAGNOSIS — M6389 Disorders of muscle in diseases classified elsewhere, multiple sites: Secondary | ICD-10-CM | POA: Diagnosis not present

## 2021-05-02 DIAGNOSIS — I69390 Apraxia following cerebral infarction: Secondary | ICD-10-CM | POA: Diagnosis not present

## 2021-05-02 DIAGNOSIS — M62561 Muscle wasting and atrophy, not elsewhere classified, right lower leg: Secondary | ICD-10-CM | POA: Diagnosis not present

## 2021-05-02 DIAGNOSIS — I69351 Hemiplegia and hemiparesis following cerebral infarction affecting right dominant side: Secondary | ICD-10-CM | POA: Diagnosis not present

## 2021-05-02 DIAGNOSIS — R293 Abnormal posture: Secondary | ICD-10-CM | POA: Diagnosis not present

## 2021-05-04 DIAGNOSIS — M62561 Muscle wasting and atrophy, not elsewhere classified, right lower leg: Secondary | ICD-10-CM | POA: Diagnosis not present

## 2021-05-04 DIAGNOSIS — I6602 Occlusion and stenosis of left middle cerebral artery: Secondary | ICD-10-CM | POA: Diagnosis not present

## 2021-05-04 DIAGNOSIS — I69390 Apraxia following cerebral infarction: Secondary | ICD-10-CM | POA: Diagnosis not present

## 2021-05-04 DIAGNOSIS — I63312 Cerebral infarction due to thrombosis of left middle cerebral artery: Secondary | ICD-10-CM | POA: Diagnosis not present

## 2021-05-04 DIAGNOSIS — R2689 Other abnormalities of gait and mobility: Secondary | ICD-10-CM | POA: Diagnosis not present

## 2021-05-04 DIAGNOSIS — R278 Other lack of coordination: Secondary | ICD-10-CM | POA: Diagnosis not present

## 2021-05-04 DIAGNOSIS — I69351 Hemiplegia and hemiparesis following cerebral infarction affecting right dominant side: Secondary | ICD-10-CM | POA: Diagnosis not present

## 2021-05-04 DIAGNOSIS — I69320 Aphasia following cerebral infarction: Secondary | ICD-10-CM | POA: Diagnosis not present

## 2021-05-04 DIAGNOSIS — I69391 Dysphagia following cerebral infarction: Secondary | ICD-10-CM | POA: Diagnosis not present

## 2021-05-07 DIAGNOSIS — I69351 Hemiplegia and hemiparesis following cerebral infarction affecting right dominant side: Secondary | ICD-10-CM | POA: Diagnosis not present

## 2021-05-07 DIAGNOSIS — M6389 Disorders of muscle in diseases classified elsewhere, multiple sites: Secondary | ICD-10-CM | POA: Diagnosis not present

## 2021-05-07 DIAGNOSIS — I69390 Apraxia following cerebral infarction: Secondary | ICD-10-CM | POA: Diagnosis not present

## 2021-05-07 DIAGNOSIS — M62561 Muscle wasting and atrophy, not elsewhere classified, right lower leg: Secondary | ICD-10-CM | POA: Diagnosis not present

## 2021-05-07 DIAGNOSIS — R278 Other lack of coordination: Secondary | ICD-10-CM | POA: Diagnosis not present

## 2021-05-07 DIAGNOSIS — I69391 Dysphagia following cerebral infarction: Secondary | ICD-10-CM | POA: Diagnosis not present

## 2021-05-07 DIAGNOSIS — R2689 Other abnormalities of gait and mobility: Secondary | ICD-10-CM | POA: Diagnosis not present

## 2021-05-07 DIAGNOSIS — R293 Abnormal posture: Secondary | ICD-10-CM | POA: Diagnosis not present

## 2021-05-07 DIAGNOSIS — I69320 Aphasia following cerebral infarction: Secondary | ICD-10-CM | POA: Diagnosis not present

## 2021-05-07 DIAGNOSIS — I6602 Occlusion and stenosis of left middle cerebral artery: Secondary | ICD-10-CM | POA: Diagnosis not present

## 2021-05-07 DIAGNOSIS — I63312 Cerebral infarction due to thrombosis of left middle cerebral artery: Secondary | ICD-10-CM | POA: Diagnosis not present

## 2021-05-08 DIAGNOSIS — I69320 Aphasia following cerebral infarction: Secondary | ICD-10-CM | POA: Diagnosis not present

## 2021-05-08 DIAGNOSIS — I69391 Dysphagia following cerebral infarction: Secondary | ICD-10-CM | POA: Diagnosis not present

## 2021-05-08 DIAGNOSIS — I6602 Occlusion and stenosis of left middle cerebral artery: Secondary | ICD-10-CM | POA: Diagnosis not present

## 2021-05-08 DIAGNOSIS — I69390 Apraxia following cerebral infarction: Secondary | ICD-10-CM | POA: Diagnosis not present

## 2021-05-09 DIAGNOSIS — R278 Other lack of coordination: Secondary | ICD-10-CM | POA: Diagnosis not present

## 2021-05-09 DIAGNOSIS — I69351 Hemiplegia and hemiparesis following cerebral infarction affecting right dominant side: Secondary | ICD-10-CM | POA: Diagnosis not present

## 2021-05-09 DIAGNOSIS — M6389 Disorders of muscle in diseases classified elsewhere, multiple sites: Secondary | ICD-10-CM | POA: Diagnosis not present

## 2021-05-09 DIAGNOSIS — I63312 Cerebral infarction due to thrombosis of left middle cerebral artery: Secondary | ICD-10-CM | POA: Diagnosis not present

## 2021-05-09 DIAGNOSIS — M62561 Muscle wasting and atrophy, not elsewhere classified, right lower leg: Secondary | ICD-10-CM | POA: Diagnosis not present

## 2021-05-09 DIAGNOSIS — R2689 Other abnormalities of gait and mobility: Secondary | ICD-10-CM | POA: Diagnosis not present

## 2021-05-09 DIAGNOSIS — R293 Abnormal posture: Secondary | ICD-10-CM | POA: Diagnosis not present

## 2021-05-10 ENCOUNTER — Encounter: Payer: Self-pay | Admitting: Adult Health

## 2021-05-10 ENCOUNTER — Non-Acute Institutional Stay (SKILLED_NURSING_FACILITY): Payer: Medicare Other | Admitting: Adult Health

## 2021-05-10 DIAGNOSIS — I739 Peripheral vascular disease, unspecified: Secondary | ICD-10-CM | POA: Diagnosis not present

## 2021-05-10 DIAGNOSIS — R0902 Hypoxemia: Secondary | ICD-10-CM | POA: Diagnosis not present

## 2021-05-10 DIAGNOSIS — I69359 Hemiplegia and hemiparesis following cerebral infarction affecting unspecified side: Secondary | ICD-10-CM | POA: Diagnosis not present

## 2021-05-10 DIAGNOSIS — I480 Paroxysmal atrial fibrillation: Secondary | ICD-10-CM | POA: Diagnosis not present

## 2021-05-10 DIAGNOSIS — I69391 Dysphagia following cerebral infarction: Secondary | ICD-10-CM

## 2021-05-10 DIAGNOSIS — J439 Emphysema, unspecified: Secondary | ICD-10-CM

## 2021-05-10 DIAGNOSIS — I10 Essential (primary) hypertension: Secondary | ICD-10-CM

## 2021-05-10 LAB — MAGNESIUM: Magnesium: 1.8

## 2021-05-10 NOTE — Progress Notes (Signed)
Location:  [place] Room Number: 116-A Place of Service:  SNF [phone]) Provider:  Royal Hawthorn, NP   Patient Care Team: Virgie Dad, MD as PCP - General (Internal Medicine) Jerline Pain, MD as PCP - Cardiology (Cardiology) Deboraha Sprang, MD as PCP - Electrophysiology (Cardiology) Jovita Gamma, MD as Consulting Physician (Neurosurgery)  Extended Emergency Contact Information Primary Emergency Contact: Lessig,[name] Address: [place] Phone: [phone] Mobile Phone: [phone] Relation: Spouse Secondary Emergency Contact: Shiller,[name] Address: [address]          [place], [place] [address] [place] Mobile Phone: [phone] Relation: Son  Code Status:  Full Code  Goals of care: Advanced Directive information Advanced Directives 05/10/2021  Does Patient Have a Medical Advance Directive? Yes  Type of Advance Directive Living will  Does patient want to make changes to medical advance directive? No - Patient declined  Copy of [place] in Chart? -  Would patient like information on creating a medical advance directive? -     Chief Complaint  Patient presents with   Medical Management of Chronic Issues    Routine visit. Discuss need for hep c screening, shingrix, and dexa,or post pone if patient refuses.    Acute Visit    Lack of motivation     HPI:  Pt is a 76 y.o. female seen today for acute visit for lack of motivation and medical management of chronic diseases.    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 Second hospitalization from 08/30- 09/08 due to pneumonia/sepsis.   Then was in the hospital 01/22/21-01/23/21 with progression of left MCA stroke.   01/30/21: treated for aspiration pna of the right lobe with doxycycline  BP 102/68 well controlled  Nurse reports  some lack of motivation and decreased socialization. Prior to her stroke she was very social. Has been here a few months and her husband does not feel she is depressed. Resident denies issues with depression or anxiety. Reports she is doing well on wellbutrin. Does have some difficulty sleeping due to getting checked on frequently at [facility]  COPD: severe obstruction per pulmonary. Recommended stiolto in 2020. Now on pulmicort and duoneb bid. Last exacerbation Oct 22 which was treated with prednisone. Prior to that had aspiration pna. Now on oxygen 2 liters continuously. Sats above 90.    Dysphagia: Tolerating a D2 diet, NTL. Has feeding tube for free water bid.  Able to swallow pills. Denies any cough or sob.   LDL 120 on zetia, intolerance to statin  PAD s/p fem pop bypass 2016 Past Medical History:  Diagnosis Date   Anxiety    Arthritis    "some in my lower back; probably elbows, knees" (11/18/2017)   Atrial fibrillation (HCC)    Bell's palsy    when pt. was 76 yrs old, when under stress the left side of face will droop.   Complication of anesthesia    "vascular OR 2016; BP bottomed out; couldn't get it regulated; ended up in ICU for DAYS" (11/18/2017)   GERD (gastroesophageal reflux disease)    History of kidney stones    Hypertension    Hypertrophic cardiomyopathy (HCC)    severe LV basilar hypertrophy witn no evidence of significant outflow tract obstruction, EF 65-70%, mild LAE, mild TR, grade 1a diastolic dysfunction [id number] (Dr. Elta [place]  Skains) (Atrial Septal Hypertrophy pattern)-- Intra-op TEE with dsignificant outflow tract obstruction - AI, MR & TR   Insomnia    Mild aortic sclerosis    Osteopenia    Peripheral vascular disease (HCC)    Syncope    , Vagal   Past Surgical History:  Procedure Laterality Date   AUGMENTATION MAMMAPLASTY Bilateral    BACK SURGERY     CARDIAC CATHETERIZATION N/A 05/07/2016   Procedure: Left Heart Cath and Coronary Angiography;  Surgeon:  Burnell Blanks, MD;  Location: [facility] LAB;  Service: Cardiovascular;  Laterality: N/A;   CARDIOVERSION N/A 09/24/2017   Procedure: CARDIOVERSION;  Surgeon: Larey Dresser, MD;  Location: [facility] ENDOSCOPY;  Service: Cardiovascular;  Laterality: N/A;   DILATION AND CURETTAGE OF UTERUS     ENDARTERECTOMY FEMORAL Right 03/02/2015   Procedure: ENDARTERECTOMY RIGHT FEMORAL;  Surgeon: Angelia Mould, MD;  Location: [facility];  Service: Vascular;  Laterality: Right;   ESOPHAGOGASTRODUODENOSCOPY (EGD) WITH PROPOFOL N/A 12/01/2020   Procedure: ESOPHAGOGASTRODUODENOSCOPY (EGD) WITH PROPOFOL;  Surgeon: Jesusita Oka, MD;  Location: [facility] ENDOSCOPY;  Service: General;  Laterality: N/A;   FACIAL COSMETIC SURGERY Left 2002   "related to [facility] @ age 35; left eye/side of face droopy; tried to make area symmetrical"   FEMORAL-POPLITEAL BYPASS GRAFT Right 03/02/2015   Procedure: BYPASS GRAFT FEMORAL-BELOW KNEE POPLITEAL ARTERY;  Surgeon: Angelia Mould, MD;  Location: [facility];  Service: Vascular;  Laterality: Right;   INGUINAL HERNIA REPAIR Bilateral 2002   IR ANGIO INTRA EXTRACRAN SEL COM CAROTID INNOMINATE UNI L MOD SED  11/16/2020   IR CT HEAD LTD  11/13/2020   IR PERCUTANEOUS ART THROMBECTOMY/INFUSION INTRACRANIAL INC DIAG ANGIO  11/13/2020   OVARIAN CYST REMOVAL Left    PEG PLACEMENT N/A 12/01/2020   Procedure: PERCUTANEOUS ENDOSCOPIC GASTROSTOMY (PEG) PLACEMENT;  Surgeon: Jesusita Oka, MD;  Location: [facility];  Service: General;  Laterality: N/A;   PERIPHERAL VASCULAR CATHETERIZATION N/A 01/16/2015   Procedure: Abdominal Aortogram;  Surgeon: Angelia Mould, MD;  Location: [facility] LAB;  Service: Cardiovascular;  Laterality: N/A;   POSTERIOR LUMBAR FUSION  2015   "have plates and screws in there"   [place] N/A 11/13/2020   Procedure: IR WITH ANESTHESIA;  Surgeon: Radiologist, Medication, MD;  Location: [facility];  Service: Radiology;  Laterality: N/A;    TONSILLECTOMY      Allergies  Allergen Reactions   Amoxicillin Other (See Comments)    UTI Has patient had a PCN reaction causing immediate rash, facial/tongue/throat swelling, SOB or lightheadedness with hypotension: No Has patient had a PCN reaction causing severe rash involving mucus membranes or skin necrosis: No Has patient had a PCN reaction that required hospitalization: No Has patient had a PCN reaction occurring within the last 10 years: Yes--UTI ONLY If all of the above answers are "NO", then may proceed with Cephalosporin use.    Atenolol Cough   Crestor [Rosuvastatin Calcium] Other (See Comments)    Muscle aches   Pravastatin Other (See Comments)    Muscle aches   Sulfa Antibiotics Nausea Only   Codeine Other (See Comments)    hallucinations    Outpatient Encounter Medications as of 05/10/2021  Medication Sig   acetaminophen (TYLENOL) 325 MG tablet Take 650 mg by mouth every 4 (four) hours as needed for mild pain.   amiodarone (PACERONE) 200 MG tablet Place 1 tablet (200 mg total) into feeding tube daily.   amLODipine (NORVASC) 5 MG  tablet Take 5 mg by mouth daily. In the morning 8-11 am   apixaban (ELIQUIS) 5 MG TABS tablet Place 1 tablet (5 mg total) into feeding tube 2 (two) times daily.   budesonide (PULMICORT) 0.25 MG/2ML nebulizer solution Take 0.25 mg by nebulization 2 (two) times daily. Rinse mouth after use   buPROPion ER (WELLBUTRIN SR) 100 MG 12 hr tablet Take 100 mg by mouth 2 (two) times daily.   ezetimibe (ZETIA) 10 MG tablet Place 1 tablet (10 mg total) into feeding tube daily.   furosemide (LASIX) 20 MG tablet Take 1 tablet (20 mg total) by mouth daily.   hydrALAZINE (APRESOLINE) 10 MG tablet Take 10 mg by mouth 3 (three) times daily as needed. SBP>165 or DBP >95   ipratropium-albuterol (DUONEB) 0.5-2.5 (3) MG/3ML SOLN Take 3 mLs by nebulization every 4 (four) hours as needed.   metoprolol tartrate (LOPRESSOR) 50 MG tablet Place 1 tablet (50 mg total)  into feeding tube 2 (two) times daily.   nystatin cream (MYCOSTATIN) Apply 1 application topically 2 (two) times daily as needed for dry skin (apply to perirectal rash).   OXYGEN Inhale 3 L into the lungs as directed. to maintain sats > or equal to 90%. May titrate Three Times A Day; 07:00 AM - 03:00 PM, 03:00 PM - 11:00 PM, 11:00 PM - 7:00 am   pantoprazole sodium (PROTONIX) 40 mg/20 mL SUSP Place 40 mg into feeding tube 2 (two) times daily.   Polyethyl Glycol-Propyl Glycol (SYSTANE) 0.4-0.3 % SOLN Apply 2 drops to eye 4 (four) times daily as needed.   Water For Irrigation, Sterile (FREE WATER) SOLN Place 250 mLs into feeding tube 6 (six) times daily.   zinc oxide 20 % ointment Apply 1 application topically as needed for irritation (apply to perirectal area prn - use with nystatin).   [DISCONTINUED] protein supplement (PROSOURCE NO CARB) LIQD Place 45 mLs into feeding tube 2 (two) times daily.   [DISCONTINUED] traZODone (DESYREL) 50 MG tablet Take 25 mg by mouth as needed for sleep.   No facility-administered encounter medications on file as of 05/10/2021.    Review of Systems  Constitutional:  Negative for activity change, appetite change, chills, diaphoresis, fatigue, fever and unexpected weight change.  HENT:  Negative for congestion.   Respiratory:  Negative for cough, shortness of breath and wheezing.   Cardiovascular:  Positive for leg swelling. Negative for chest pain and palpitations.  Gastrointestinal:  Negative for abdominal distention, abdominal pain, constipation and diarrhea.  Genitourinary:  Negative for difficulty urinating and dysuria.  Musculoskeletal:  Positive for gait problem. Negative for arthralgias, back pain, joint swelling and myalgias.  Neurological:  Positive for speech difficulty and weakness. Negative for dizziness, tremors, seizures, syncope, light-headedness, numbness and headaches.  Psychiatric/Behavioral:  Negative for agitation, behavioral problems, confusion  and dysphoric mood. The patient is not nervous/anxious.    Immunization History  Administered Date(s) Administered   Influenza Split 01/16/2009, 04/30/2010, 01/26/2016, 02/07/2018, 02/01/2020   Influenza, High Dose Seasonal PF 02/07/2018, 01/25/2019, 02/28/2021   Influenza,inj,Quad PF,6+ Mos 03/06/2012, 01/26/2016, 01/10/2017   Influenza,inj,quad, With Preservative 02/14/2015   Moderna Covid-19 Vaccine Bivalent Booster 74yrs & up 02/28/2021   PFIZER(Purple Top)SARS-COV-2 Vaccination 05/06/2019, 05/27/2019, 01/27/2020, 09/18/2020   Pneumococcal Conjugate-13 03/19/2017   Pneumococcal Polysaccharide-23 02/14/2015   Td 02/25/2005   Tdap 05/19/2013   Zoster, Live 07/28/2006   Pertinent  Health Maintenance Due  Topic Date Due   DEXA SCAN  Never done   INFLUENZA VACCINE  Completed  Fall Risk 01/22/2021 01/23/2021 01/23/2021 02/02/2021 02/02/2021  Falls in the past year? - - - - -  Was there an injury with Fall? - - - - -  Fall Risk Category Calculator - - - - -  Fall Risk Category - - - - -  Patient Fall Risk Level High fall risk High fall risk High fall risk High fall risk High fall risk  Patient at Risk for Falls Due to - - - - -  Fall risk Follow up - - - - -   Functional Status Survey:    Vitals:   05/10/21 1042  BP: 102/68  Pulse: 83  Resp: 16  Temp: (!) 97 F (36.1 C)  SpO2: 95%  Weight: 99 lb 12.8 oz (45.3 kg)  Height: 5\' 3"  (1.6 m)   Body mass index is 17.68 kg/m. Physical Exam Vitals and nursing note reviewed.  Constitutional:      General: She is not in acute distress.    Appearance: She is not diaphoretic.  HENT:     Head: Normocephalic and atraumatic.  Neck:     Vascular: No JVD.  Cardiovascular:     Rate and Rhythm: Normal rate and regular rhythm.     Heart sounds: No murmur heard. Pulmonary:     Effort: Pulmonary effort is normal. No respiratory distress.     Breath sounds: No wheezing.     Comments: Decreased bases Abdominal:     General:  Abdomen is flat. Bowel sounds are normal.     Palpations: Abdomen is soft.  Musculoskeletal:     Right lower leg: Edema (+1 pitting) present.     Left lower leg: No edema.  Skin:    General: Skin is warm and dry.  Neurological:     Mental Status: She is alert.     Comments: Right sided weakness  Psychiatric:        Mood and Affect: Mood normal.    Labs reviewed: Recent Labs    11/29/20 0128 11/30/20 0826 12/02/20 0216 12/04/20 0052 01/22/21 [id number] 01/23/21 0259 01/30/21 0000 02/02/21 0519 03/26/21 0000  NA 151*   < > 140   < > 138 135 143 139 136*  K 4.4   < > 4.2   < > 3.9 3.6 4.0 3.5 3.9  CL 113*   < > 108   < > 98 94* 99 100 100  CO2 30   < > 25   < >  --  28 31* 30 29*  GLUCOSE 140*   < > 181*   < > 106* 85  --  114*  --   BUN 60*   < > 51*   < > 19 14 13   13 21  32*  CREATININE 0.75   < > 0.79   < > 0.90 0.85 0.7   0.7 0.72 0.7  CALCIUM 8.9   < > 8.4*   < >  --  8.7* 9.6   9.6 8.9 8.9  MG 2.3  --  2.2  --   --  2.0  --   --   --   PHOS 3.4  --  3.0  --   --  4.1  --   --   --    < > = values in this interval not displayed.   Recent Labs    12/22/20 0409 01/22/21 0651 01/23/21 0259 03/26/21 0000  AST 35 39 28 23  ALT 47* 38 34 31  ALKPHOS  69 68 57 67  BILITOT 0.6 1.3* 1.5*  --   PROT 5.6* 6.1* 5.6*  --   ALBUMIN 2.6* 3.4* 3.1* 3.3*   Recent Labs    01/15/21 0603 01/22/21 0651 01/22/21 0658 01/23/21 0259 01/30/21 0000 02/02/21 0519 03/26/21 0000  WBC 8.8 10.7*  --  8.9 9.3 9.3 7.5  NEUTROABS 5.5 7.7  --   --   --  7.2  --   HGB 15.3* 15.5*   < > 14.5 15.1 14.4 14.2  HCT 47.5* 48.4*   < > 45.8 48* 45.3 43  MCV 95.2 96.4  --  95.0  --  95.4  --   PLT 234 237  --  195 261 286 241   < > = values in this interval not displayed.   Lab Results  Component Value Date   TSH 1.087 11/18/2020   Lab Results  Component Value Date   HGBA1C 6.0 (H) 11/13/2020   Lab Results  Component Value Date   CHOL 209 (H) 11/13/2020   HDL 70 11/13/2020   LDLCALC  120 (H) 11/13/2020   TRIG 97 11/13/2020   CHOLHDL 3.0 11/13/2020    Significant Diagnostic Results in last 30 days:  No results found.  Assessment/Plan  1. Paroxysmal atrial fibrillation [facility]) Last saw cardiology 02/26/21 Continue on amiodarone. Monitor TSH and LFts Continue Eliquis  2. PAD (peripheral artery disease) ([place]) S/p fem pop by Dr. Scot Dock  3. 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 Dysphagia, post-stroke Continues on D3 NTL Followed by ST Continues with peg for free water.   5. Essential hypertension Controlled  6. Pulmonary emphysema, unspecified emphysema type ([place]) I showed her husband the notes from Dr Lamonte Sakai and his recommendation for long acting bronchodialator therapy. He was not aware of the severity of her COPD (severe obstruction)or the visit in 2020 and had questioned why she is on medication for COPD. She is now oxygen dependent and had an exacerbation in Oct. I recommend we continue the duonebs and pulmicort. If she would like to change to a metered dose inhaler in the future we could consider another option. She is doing well now. Also offered for pulmonary follow up and they declined at this time.   7. Hypoxia On 2 liters [place] as above  Family/ staff Communication: discussed with the resident and her husband   Labs/tests ordered:  NA

## 2021-05-11 ENCOUNTER — Encounter: Payer: Self-pay | Admitting: Adult Health

## 2021-05-11 ENCOUNTER — Encounter: Payer: Self-pay | Admitting: Internal Medicine

## 2021-05-11 DIAGNOSIS — I63312 Cerebral infarction due to thrombosis of left middle cerebral artery: Secondary | ICD-10-CM | POA: Diagnosis not present

## 2021-05-11 DIAGNOSIS — R293 Abnormal posture: Secondary | ICD-10-CM | POA: Diagnosis not present

## 2021-05-11 DIAGNOSIS — R2689 Other abnormalities of gait and mobility: Secondary | ICD-10-CM | POA: Diagnosis not present

## 2021-05-11 DIAGNOSIS — I69351 Hemiplegia and hemiparesis following cerebral infarction affecting right dominant side: Secondary | ICD-10-CM | POA: Diagnosis not present

## 2021-05-11 DIAGNOSIS — R278 Other lack of coordination: Secondary | ICD-10-CM | POA: Diagnosis not present

## 2021-05-11 DIAGNOSIS — M6389 Disorders of muscle in diseases classified elsewhere, multiple sites: Secondary | ICD-10-CM | POA: Diagnosis not present

## 2021-05-11 DIAGNOSIS — M62561 Muscle wasting and atrophy, not elsewhere classified, right lower leg: Secondary | ICD-10-CM | POA: Diagnosis not present

## 2021-05-14 DIAGNOSIS — M6389 Disorders of muscle in diseases classified elsewhere, multiple sites: Secondary | ICD-10-CM | POA: Diagnosis not present

## 2021-05-14 DIAGNOSIS — I69320 Aphasia following cerebral infarction: Secondary | ICD-10-CM | POA: Diagnosis not present

## 2021-05-14 DIAGNOSIS — R293 Abnormal posture: Secondary | ICD-10-CM | POA: Diagnosis not present

## 2021-05-14 DIAGNOSIS — I69351 Hemiplegia and hemiparesis following cerebral infarction affecting right dominant side: Secondary | ICD-10-CM | POA: Diagnosis not present

## 2021-05-14 DIAGNOSIS — R2689 Other abnormalities of gait and mobility: Secondary | ICD-10-CM | POA: Diagnosis not present

## 2021-05-14 DIAGNOSIS — M62561 Muscle wasting and atrophy, not elsewhere classified, right lower leg: Secondary | ICD-10-CM | POA: Diagnosis not present

## 2021-05-14 DIAGNOSIS — R278 Other lack of coordination: Secondary | ICD-10-CM | POA: Diagnosis not present

## 2021-05-14 DIAGNOSIS — I69391 Dysphagia following cerebral infarction: Secondary | ICD-10-CM | POA: Diagnosis not present

## 2021-05-14 DIAGNOSIS — I6602 Occlusion and stenosis of left middle cerebral artery: Secondary | ICD-10-CM | POA: Diagnosis not present

## 2021-05-14 DIAGNOSIS — I69390 Apraxia following cerebral infarction: Secondary | ICD-10-CM | POA: Diagnosis not present

## 2021-05-14 DIAGNOSIS — I63312 Cerebral infarction due to thrombosis of left middle cerebral artery: Secondary | ICD-10-CM | POA: Diagnosis not present

## 2021-05-15 DIAGNOSIS — I69320 Aphasia following cerebral infarction: Secondary | ICD-10-CM | POA: Diagnosis not present

## 2021-05-15 DIAGNOSIS — I69390 Apraxia following cerebral infarction: Secondary | ICD-10-CM | POA: Diagnosis not present

## 2021-05-15 DIAGNOSIS — I69391 Dysphagia following cerebral infarction: Secondary | ICD-10-CM | POA: Diagnosis not present

## 2021-05-15 DIAGNOSIS — I6602 Occlusion and stenosis of left middle cerebral artery: Secondary | ICD-10-CM | POA: Diagnosis not present

## 2021-05-16 ENCOUNTER — Encounter: Payer: Medicare Other | Admitting: Physical Medicine & Rehabilitation

## 2021-05-16 DIAGNOSIS — I69351 Hemiplegia and hemiparesis following cerebral infarction affecting right dominant side: Secondary | ICD-10-CM | POA: Diagnosis not present

## 2021-05-16 DIAGNOSIS — R293 Abnormal posture: Secondary | ICD-10-CM | POA: Diagnosis not present

## 2021-05-16 DIAGNOSIS — I63312 Cerebral infarction due to thrombosis of left middle cerebral artery: Secondary | ICD-10-CM | POA: Diagnosis not present

## 2021-05-16 DIAGNOSIS — R278 Other lack of coordination: Secondary | ICD-10-CM | POA: Diagnosis not present

## 2021-05-16 DIAGNOSIS — M62561 Muscle wasting and atrophy, not elsewhere classified, right lower leg: Secondary | ICD-10-CM | POA: Diagnosis not present

## 2021-05-16 DIAGNOSIS — R2689 Other abnormalities of gait and mobility: Secondary | ICD-10-CM | POA: Diagnosis not present

## 2021-05-16 DIAGNOSIS — M6389 Disorders of muscle in diseases classified elsewhere, multiple sites: Secondary | ICD-10-CM | POA: Diagnosis not present

## 2021-05-18 DIAGNOSIS — R2689 Other abnormalities of gait and mobility: Secondary | ICD-10-CM | POA: Diagnosis not present

## 2021-05-18 DIAGNOSIS — I69391 Dysphagia following cerebral infarction: Secondary | ICD-10-CM | POA: Diagnosis not present

## 2021-05-18 DIAGNOSIS — R278 Other lack of coordination: Secondary | ICD-10-CM | POA: Diagnosis not present

## 2021-05-18 DIAGNOSIS — I63312 Cerebral infarction due to thrombosis of left middle cerebral artery: Secondary | ICD-10-CM | POA: Diagnosis not present

## 2021-05-18 DIAGNOSIS — I6602 Occlusion and stenosis of left middle cerebral artery: Secondary | ICD-10-CM | POA: Diagnosis not present

## 2021-05-18 DIAGNOSIS — M6389 Disorders of muscle in diseases classified elsewhere, multiple sites: Secondary | ICD-10-CM | POA: Diagnosis not present

## 2021-05-18 DIAGNOSIS — I69390 Apraxia following cerebral infarction: Secondary | ICD-10-CM | POA: Diagnosis not present

## 2021-05-18 DIAGNOSIS — M62561 Muscle wasting and atrophy, not elsewhere classified, right lower leg: Secondary | ICD-10-CM | POA: Diagnosis not present

## 2021-05-18 DIAGNOSIS — I69351 Hemiplegia and hemiparesis following cerebral infarction affecting right dominant side: Secondary | ICD-10-CM | POA: Diagnosis not present

## 2021-05-18 DIAGNOSIS — R293 Abnormal posture: Secondary | ICD-10-CM | POA: Diagnosis not present

## 2021-05-18 DIAGNOSIS — I69320 Aphasia following cerebral infarction: Secondary | ICD-10-CM | POA: Diagnosis not present

## 2021-05-21 DIAGNOSIS — R278 Other lack of coordination: Secondary | ICD-10-CM | POA: Diagnosis not present

## 2021-05-21 DIAGNOSIS — R293 Abnormal posture: Secondary | ICD-10-CM | POA: Diagnosis not present

## 2021-05-21 DIAGNOSIS — I63312 Cerebral infarction due to thrombosis of left middle cerebral artery: Secondary | ICD-10-CM | POA: Diagnosis not present

## 2021-05-21 DIAGNOSIS — M62561 Muscle wasting and atrophy, not elsewhere classified, right lower leg: Secondary | ICD-10-CM | POA: Diagnosis not present

## 2021-05-21 DIAGNOSIS — I69391 Dysphagia following cerebral infarction: Secondary | ICD-10-CM | POA: Diagnosis not present

## 2021-05-21 DIAGNOSIS — R2689 Other abnormalities of gait and mobility: Secondary | ICD-10-CM | POA: Diagnosis not present

## 2021-05-21 DIAGNOSIS — I69351 Hemiplegia and hemiparesis following cerebral infarction affecting right dominant side: Secondary | ICD-10-CM | POA: Diagnosis not present

## 2021-05-21 DIAGNOSIS — I69390 Apraxia following cerebral infarction: Secondary | ICD-10-CM | POA: Diagnosis not present

## 2021-05-21 DIAGNOSIS — M6389 Disorders of muscle in diseases classified elsewhere, multiple sites: Secondary | ICD-10-CM | POA: Diagnosis not present

## 2021-05-21 DIAGNOSIS — I6602 Occlusion and stenosis of left middle cerebral artery: Secondary | ICD-10-CM | POA: Diagnosis not present

## 2021-05-21 DIAGNOSIS — I69320 Aphasia following cerebral infarction: Secondary | ICD-10-CM | POA: Diagnosis not present

## 2021-05-23 DIAGNOSIS — I63312 Cerebral infarction due to thrombosis of left middle cerebral artery: Secondary | ICD-10-CM | POA: Diagnosis not present

## 2021-05-23 DIAGNOSIS — R2689 Other abnormalities of gait and mobility: Secondary | ICD-10-CM | POA: Diagnosis not present

## 2021-05-23 DIAGNOSIS — M6389 Disorders of muscle in diseases classified elsewhere, multiple sites: Secondary | ICD-10-CM | POA: Diagnosis not present

## 2021-05-23 DIAGNOSIS — M62561 Muscle wasting and atrophy, not elsewhere classified, right lower leg: Secondary | ICD-10-CM | POA: Diagnosis not present

## 2021-05-23 DIAGNOSIS — I69351 Hemiplegia and hemiparesis following cerebral infarction affecting right dominant side: Secondary | ICD-10-CM | POA: Diagnosis not present

## 2021-05-23 DIAGNOSIS — R293 Abnormal posture: Secondary | ICD-10-CM | POA: Diagnosis not present

## 2021-05-23 DIAGNOSIS — R278 Other lack of coordination: Secondary | ICD-10-CM | POA: Diagnosis not present

## 2021-05-24 DIAGNOSIS — I6602 Occlusion and stenosis of left middle cerebral artery: Secondary | ICD-10-CM | POA: Diagnosis not present

## 2021-05-24 DIAGNOSIS — I69391 Dysphagia following cerebral infarction: Secondary | ICD-10-CM | POA: Diagnosis not present

## 2021-05-24 DIAGNOSIS — I69320 Aphasia following cerebral infarction: Secondary | ICD-10-CM | POA: Diagnosis not present

## 2021-05-24 DIAGNOSIS — I69390 Apraxia following cerebral infarction: Secondary | ICD-10-CM | POA: Diagnosis not present

## 2021-05-25 DIAGNOSIS — R2689 Other abnormalities of gait and mobility: Secondary | ICD-10-CM | POA: Diagnosis not present

## 2021-05-25 DIAGNOSIS — M62561 Muscle wasting and atrophy, not elsewhere classified, right lower leg: Secondary | ICD-10-CM | POA: Diagnosis not present

## 2021-05-25 DIAGNOSIS — M6389 Disorders of muscle in diseases classified elsewhere, multiple sites: Secondary | ICD-10-CM | POA: Diagnosis not present

## 2021-05-25 DIAGNOSIS — I69390 Apraxia following cerebral infarction: Secondary | ICD-10-CM | POA: Diagnosis not present

## 2021-05-25 DIAGNOSIS — I69320 Aphasia following cerebral infarction: Secondary | ICD-10-CM | POA: Diagnosis not present

## 2021-05-25 DIAGNOSIS — I63312 Cerebral infarction due to thrombosis of left middle cerebral artery: Secondary | ICD-10-CM | POA: Diagnosis not present

## 2021-05-25 DIAGNOSIS — I6602 Occlusion and stenosis of left middle cerebral artery: Secondary | ICD-10-CM | POA: Diagnosis not present

## 2021-05-25 DIAGNOSIS — R278 Other lack of coordination: Secondary | ICD-10-CM | POA: Diagnosis not present

## 2021-05-25 DIAGNOSIS — I69391 Dysphagia following cerebral infarction: Secondary | ICD-10-CM | POA: Diagnosis not present

## 2021-05-25 DIAGNOSIS — R293 Abnormal posture: Secondary | ICD-10-CM | POA: Diagnosis not present

## 2021-05-25 DIAGNOSIS — I69351 Hemiplegia and hemiparesis following cerebral infarction affecting right dominant side: Secondary | ICD-10-CM | POA: Diagnosis not present

## 2021-05-26 ENCOUNTER — Telehealth: Payer: Self-pay | Admitting: Adult Health

## 2021-05-26 MED ORDER — ERYTHROMYCIN 5 MG/GM OP OINT: TOPICAL_OINTMENT | Freq: Four times a day (QID) | OPHTHALMIC | Status: DC

## 2021-05-26 NOTE — Telephone Encounter (Signed)
Nurse Amy called from [facility] to report that Beverly Li's husband has asked for treatment for [name]'s eyes. There are reports of purulent drainage and discomfort. No change in vision. They are concerned for infection. Will prescribe erythromycin

## 2021-06-01 DIAGNOSIS — I69391 Dysphagia following cerebral infarction: Secondary | ICD-10-CM | POA: Diagnosis not present

## 2021-06-01 DIAGNOSIS — M6389 Disorders of muscle in diseases classified elsewhere, multiple sites: Secondary | ICD-10-CM | POA: Diagnosis not present

## 2021-06-01 DIAGNOSIS — M62561 Muscle wasting and atrophy, not elsewhere classified, right lower leg: Secondary | ICD-10-CM | POA: Diagnosis not present

## 2021-06-01 DIAGNOSIS — R293 Abnormal posture: Secondary | ICD-10-CM | POA: Diagnosis not present

## 2021-06-01 DIAGNOSIS — R278 Other lack of coordination: Secondary | ICD-10-CM | POA: Diagnosis not present

## 2021-06-01 DIAGNOSIS — I69351 Hemiplegia and hemiparesis following cerebral infarction affecting right dominant side: Secondary | ICD-10-CM | POA: Diagnosis not present

## 2021-06-01 DIAGNOSIS — I69320 Aphasia following cerebral infarction: Secondary | ICD-10-CM | POA: Diagnosis not present

## 2021-06-01 DIAGNOSIS — R2689 Other abnormalities of gait and mobility: Secondary | ICD-10-CM | POA: Diagnosis not present

## 2021-06-01 DIAGNOSIS — I6602 Occlusion and stenosis of left middle cerebral artery: Secondary | ICD-10-CM | POA: Diagnosis not present

## 2021-06-01 DIAGNOSIS — I69390 Apraxia following cerebral infarction: Secondary | ICD-10-CM | POA: Diagnosis not present

## 2021-06-01 DIAGNOSIS — I63312 Cerebral infarction due to thrombosis of left middle cerebral artery: Secondary | ICD-10-CM | POA: Diagnosis not present

## 2021-06-04 DIAGNOSIS — R278 Other lack of coordination: Secondary | ICD-10-CM | POA: Diagnosis not present

## 2021-06-04 DIAGNOSIS — M62561 Muscle wasting and atrophy, not elsewhere classified, right lower leg: Secondary | ICD-10-CM | POA: Diagnosis not present

## 2021-06-04 DIAGNOSIS — I63312 Cerebral infarction due to thrombosis of left middle cerebral artery: Secondary | ICD-10-CM | POA: Diagnosis not present

## 2021-06-04 DIAGNOSIS — I69320 Aphasia following cerebral infarction: Secondary | ICD-10-CM | POA: Diagnosis not present

## 2021-06-04 DIAGNOSIS — I6602 Occlusion and stenosis of left middle cerebral artery: Secondary | ICD-10-CM | POA: Diagnosis not present

## 2021-06-04 DIAGNOSIS — M6389 Disorders of muscle in diseases classified elsewhere, multiple sites: Secondary | ICD-10-CM | POA: Diagnosis not present

## 2021-06-04 DIAGNOSIS — I69351 Hemiplegia and hemiparesis following cerebral infarction affecting right dominant side: Secondary | ICD-10-CM | POA: Diagnosis not present

## 2021-06-04 DIAGNOSIS — I69391 Dysphagia following cerebral infarction: Secondary | ICD-10-CM | POA: Diagnosis not present

## 2021-06-04 DIAGNOSIS — R293 Abnormal posture: Secondary | ICD-10-CM | POA: Diagnosis not present

## 2021-06-04 DIAGNOSIS — R2689 Other abnormalities of gait and mobility: Secondary | ICD-10-CM | POA: Diagnosis not present

## 2021-06-04 DIAGNOSIS — I69390 Apraxia following cerebral infarction: Secondary | ICD-10-CM | POA: Diagnosis not present

## 2021-06-06 DIAGNOSIS — I69351 Hemiplegia and hemiparesis following cerebral infarction affecting right dominant side: Secondary | ICD-10-CM | POA: Diagnosis not present

## 2021-06-06 DIAGNOSIS — M62561 Muscle wasting and atrophy, not elsewhere classified, right lower leg: Secondary | ICD-10-CM | POA: Diagnosis not present

## 2021-06-06 DIAGNOSIS — I69391 Dysphagia following cerebral infarction: Secondary | ICD-10-CM | POA: Diagnosis not present

## 2021-06-06 DIAGNOSIS — R278 Other lack of coordination: Secondary | ICD-10-CM | POA: Diagnosis not present

## 2021-06-06 DIAGNOSIS — I69390 Apraxia following cerebral infarction: Secondary | ICD-10-CM | POA: Diagnosis not present

## 2021-06-06 DIAGNOSIS — R293 Abnormal posture: Secondary | ICD-10-CM | POA: Diagnosis not present

## 2021-06-06 DIAGNOSIS — I69320 Aphasia following cerebral infarction: Secondary | ICD-10-CM | POA: Diagnosis not present

## 2021-06-06 DIAGNOSIS — I6602 Occlusion and stenosis of left middle cerebral artery: Secondary | ICD-10-CM | POA: Diagnosis not present

## 2021-06-06 DIAGNOSIS — R2689 Other abnormalities of gait and mobility: Secondary | ICD-10-CM | POA: Diagnosis not present

## 2021-06-06 DIAGNOSIS — M6389 Disorders of muscle in diseases classified elsewhere, multiple sites: Secondary | ICD-10-CM | POA: Diagnosis not present

## 2021-06-06 DIAGNOSIS — I63312 Cerebral infarction due to thrombosis of left middle cerebral artery: Secondary | ICD-10-CM | POA: Diagnosis not present

## 2021-06-08 DIAGNOSIS — R278 Other lack of coordination: Secondary | ICD-10-CM | POA: Diagnosis not present

## 2021-06-08 DIAGNOSIS — M6389 Disorders of muscle in diseases classified elsewhere, multiple sites: Secondary | ICD-10-CM | POA: Diagnosis not present

## 2021-06-08 DIAGNOSIS — I63312 Cerebral infarction due to thrombosis of left middle cerebral artery: Secondary | ICD-10-CM | POA: Diagnosis not present

## 2021-06-08 DIAGNOSIS — M62561 Muscle wasting and atrophy, not elsewhere classified, right lower leg: Secondary | ICD-10-CM | POA: Diagnosis not present

## 2021-06-08 DIAGNOSIS — I69391 Dysphagia following cerebral infarction: Secondary | ICD-10-CM | POA: Diagnosis not present

## 2021-06-08 DIAGNOSIS — I69351 Hemiplegia and hemiparesis following cerebral infarction affecting right dominant side: Secondary | ICD-10-CM | POA: Diagnosis not present

## 2021-06-08 DIAGNOSIS — R2689 Other abnormalities of gait and mobility: Secondary | ICD-10-CM | POA: Diagnosis not present

## 2021-06-08 DIAGNOSIS — I69390 Apraxia following cerebral infarction: Secondary | ICD-10-CM | POA: Diagnosis not present

## 2021-06-08 DIAGNOSIS — I69320 Aphasia following cerebral infarction: Secondary | ICD-10-CM | POA: Diagnosis not present

## 2021-06-08 DIAGNOSIS — R293 Abnormal posture: Secondary | ICD-10-CM | POA: Diagnosis not present

## 2021-06-08 DIAGNOSIS — I6602 Occlusion and stenosis of left middle cerebral artery: Secondary | ICD-10-CM | POA: Diagnosis not present

## 2021-06-11 DIAGNOSIS — I6602 Occlusion and stenosis of left middle cerebral artery: Secondary | ICD-10-CM | POA: Diagnosis not present

## 2021-06-11 DIAGNOSIS — I63312 Cerebral infarction due to thrombosis of left middle cerebral artery: Secondary | ICD-10-CM | POA: Diagnosis not present

## 2021-06-11 DIAGNOSIS — I69390 Apraxia following cerebral infarction: Secondary | ICD-10-CM | POA: Diagnosis not present

## 2021-06-11 DIAGNOSIS — R2689 Other abnormalities of gait and mobility: Secondary | ICD-10-CM | POA: Diagnosis not present

## 2021-06-11 DIAGNOSIS — R278 Other lack of coordination: Secondary | ICD-10-CM | POA: Diagnosis not present

## 2021-06-11 DIAGNOSIS — I69351 Hemiplegia and hemiparesis following cerebral infarction affecting right dominant side: Secondary | ICD-10-CM | POA: Diagnosis not present

## 2021-06-11 DIAGNOSIS — I69320 Aphasia following cerebral infarction: Secondary | ICD-10-CM | POA: Diagnosis not present

## 2021-06-11 DIAGNOSIS — M62561 Muscle wasting and atrophy, not elsewhere classified, right lower leg: Secondary | ICD-10-CM | POA: Diagnosis not present

## 2021-06-11 DIAGNOSIS — R293 Abnormal posture: Secondary | ICD-10-CM | POA: Diagnosis not present

## 2021-06-11 DIAGNOSIS — M6389 Disorders of muscle in diseases classified elsewhere, multiple sites: Secondary | ICD-10-CM | POA: Diagnosis not present

## 2021-06-11 DIAGNOSIS — I69391 Dysphagia following cerebral infarction: Secondary | ICD-10-CM | POA: Diagnosis not present

## 2021-06-13 DIAGNOSIS — R2689 Other abnormalities of gait and mobility: Secondary | ICD-10-CM | POA: Diagnosis not present

## 2021-06-13 DIAGNOSIS — I69351 Hemiplegia and hemiparesis following cerebral infarction affecting right dominant side: Secondary | ICD-10-CM | POA: Diagnosis not present

## 2021-06-13 DIAGNOSIS — R278 Other lack of coordination: Secondary | ICD-10-CM | POA: Diagnosis not present

## 2021-06-13 DIAGNOSIS — I63312 Cerebral infarction due to thrombosis of left middle cerebral artery: Secondary | ICD-10-CM | POA: Diagnosis not present

## 2021-06-13 DIAGNOSIS — M62561 Muscle wasting and atrophy, not elsewhere classified, right lower leg: Secondary | ICD-10-CM | POA: Diagnosis not present

## 2021-06-13 DIAGNOSIS — R293 Abnormal posture: Secondary | ICD-10-CM | POA: Diagnosis not present

## 2021-06-13 DIAGNOSIS — M6389 Disorders of muscle in diseases classified elsewhere, multiple sites: Secondary | ICD-10-CM | POA: Diagnosis not present

## 2021-06-15 DIAGNOSIS — I69351 Hemiplegia and hemiparesis following cerebral infarction affecting right dominant side: Secondary | ICD-10-CM | POA: Diagnosis not present

## 2021-06-15 DIAGNOSIS — I63312 Cerebral infarction due to thrombosis of left middle cerebral artery: Secondary | ICD-10-CM | POA: Diagnosis not present

## 2021-06-15 DIAGNOSIS — R293 Abnormal posture: Secondary | ICD-10-CM | POA: Diagnosis not present

## 2021-06-15 DIAGNOSIS — I6602 Occlusion and stenosis of left middle cerebral artery: Secondary | ICD-10-CM | POA: Diagnosis not present

## 2021-06-15 DIAGNOSIS — I69320 Aphasia following cerebral infarction: Secondary | ICD-10-CM | POA: Diagnosis not present

## 2021-06-15 DIAGNOSIS — I69390 Apraxia following cerebral infarction: Secondary | ICD-10-CM | POA: Diagnosis not present

## 2021-06-15 DIAGNOSIS — R2689 Other abnormalities of gait and mobility: Secondary | ICD-10-CM | POA: Diagnosis not present

## 2021-06-15 DIAGNOSIS — M6389 Disorders of muscle in diseases classified elsewhere, multiple sites: Secondary | ICD-10-CM | POA: Diagnosis not present

## 2021-06-15 DIAGNOSIS — M62561 Muscle wasting and atrophy, not elsewhere classified, right lower leg: Secondary | ICD-10-CM | POA: Diagnosis not present

## 2021-06-15 DIAGNOSIS — I69391 Dysphagia following cerebral infarction: Secondary | ICD-10-CM | POA: Diagnosis not present

## 2021-06-15 DIAGNOSIS — R278 Other lack of coordination: Secondary | ICD-10-CM | POA: Diagnosis not present

## 2021-06-17 ENCOUNTER — Telehealth: Payer: Self-pay | Admitting: Nurse Practitioner

## 2021-06-17 NOTE — Telephone Encounter (Signed)
Facility nursed called 06/16/21, reported the patient has R eye stye, Erythromycin ophthalmic oint started.   ?

## 2021-06-18 ENCOUNTER — Non-Acute Institutional Stay (SKILLED_NURSING_FACILITY): Payer: Medicare Other | Admitting: Internal Medicine

## 2021-06-18 ENCOUNTER — Encounter: Payer: Self-pay | Admitting: Internal Medicine

## 2021-06-18 DIAGNOSIS — R293 Abnormal posture: Secondary | ICD-10-CM | POA: Diagnosis not present

## 2021-06-18 DIAGNOSIS — R2689 Other abnormalities of gait and mobility: Secondary | ICD-10-CM | POA: Diagnosis not present

## 2021-06-18 DIAGNOSIS — H00019 Hordeolum externum unspecified eye, unspecified eyelid: Secondary | ICD-10-CM

## 2021-06-18 DIAGNOSIS — M62561 Muscle wasting and atrophy, not elsewhere classified, right lower leg: Secondary | ICD-10-CM | POA: Diagnosis not present

## 2021-06-18 DIAGNOSIS — I421 Obstructive hypertrophic cardiomyopathy: Secondary | ICD-10-CM

## 2021-06-18 DIAGNOSIS — E782 Mixed hyperlipidemia: Secondary | ICD-10-CM | POA: Diagnosis not present

## 2021-06-18 DIAGNOSIS — F339 Major depressive disorder, recurrent, unspecified: Secondary | ICD-10-CM

## 2021-06-18 DIAGNOSIS — I69391 Dysphagia following cerebral infarction: Secondary | ICD-10-CM | POA: Diagnosis not present

## 2021-06-18 DIAGNOSIS — I10 Essential (primary) hypertension: Secondary | ICD-10-CM | POA: Diagnosis not present

## 2021-06-18 DIAGNOSIS — I480 Paroxysmal atrial fibrillation: Secondary | ICD-10-CM | POA: Diagnosis not present

## 2021-06-18 DIAGNOSIS — J439 Emphysema, unspecified: Secondary | ICD-10-CM | POA: Diagnosis not present

## 2021-06-18 DIAGNOSIS — I69359 Hemiplegia and hemiparesis following cerebral infarction affecting unspecified side: Secondary | ICD-10-CM

## 2021-06-18 DIAGNOSIS — M6389 Disorders of muscle in diseases classified elsewhere, multiple sites: Secondary | ICD-10-CM | POA: Diagnosis not present

## 2021-06-18 DIAGNOSIS — I739 Peripheral vascular disease, unspecified: Secondary | ICD-10-CM | POA: Diagnosis not present

## 2021-06-18 DIAGNOSIS — I69351 Hemiplegia and hemiparesis following cerebral infarction affecting right dominant side: Secondary | ICD-10-CM | POA: Diagnosis not present

## 2021-06-18 DIAGNOSIS — I63312 Cerebral infarction due to thrombosis of left middle cerebral artery: Secondary | ICD-10-CM | POA: Diagnosis not present

## 2021-06-18 DIAGNOSIS — K219 Gastro-esophageal reflux disease without esophagitis: Secondary | ICD-10-CM | POA: Diagnosis not present

## 2021-06-18 DIAGNOSIS — R278 Other lack of coordination: Secondary | ICD-10-CM | POA: Diagnosis not present

## 2021-06-18 NOTE — Progress Notes (Signed)
Location:   [facility] Room Number: 116 Place of Service:  SNF [phone]) Provider:  Veleta Miners MD  Beverly Dad, MD  Patient Care Team: Beverly Dad, MD as PCP - General (Internal Medicine) Beverly Pain, MD as PCP - Cardiology (Cardiology) Beverly Sprang, MD as PCP - Electrophysiology (Cardiology) Beverly Gamma, MD as Consulting Physician (Neurosurgery)  Extended Emergency Contact Information Primary Emergency Contact: Li,Beverly G Address: [place] Phone: [phone] Mobile Phone: [phone] Relation: Spouse Secondary Emergency Contact: Li,Beverly Address: [address]          [place], [place] [address] Beverly Li of [place] Mobile Phone: [phone] Relation: Son  Code Status:  Full Code Goals of care: Advanced Directive information Advanced Directives 06/18/2021  Does Patient Have a Medical Advance Directive? Yes  Type of Advance Directive Living will  Does patient want to make changes to medical advance directive? No - Patient declined  Copy of [place] in Chart? -  Would patient like information on creating a medical advance directive? -     Chief Complaint  Patient presents with   Medical Management of Chronic Issues   Quality Metric Gaps    Shingrix    HPI:  Pt is a 76 y.o. female seen today for medical management of chronic diseases.     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 No new Nursing issues. No Behavior issues Her weight is stable No Recent Falls Wt Readings from Last 3 Encounters:  06/18/21 99 lb 9.6 oz (45.2 kg)  05/10/21 99 lb 12.8 oz (45.3 kg)  03/26/21 98 lb (44.5 kg)  Is working with therapy  Making good progress Aphasia has improved and She is eating better Not using her Peg tube anymore     Past Medical History:  Diagnosis Date   Anxiety    Arthritis    "some in my lower back; probably elbows, knees" (11/18/2017)   Atrial fibrillation (HCC)    Bell's palsy    when pt. was 76 yrs old, when under stress the left side of face will droop.   Complication of anesthesia    "vascular OR 2016; BP bottomed out; couldn't get it regulated; ended up in ICU for DAYS" (11/18/2017)   GERD (gastroesophageal reflux disease)    History of kidney stones    Hypertension    Hypertrophic cardiomyopathy (HCC)    severe LV basilar hypertrophy witn no evidence of significant outflow tract obstruction, EF 65-70%, mild LAE, mild TR, grade 1a diastolic dysfunction [id number] (Dr. Candee Furbish) (Atrial Septal Hypertrophy pattern)-- Intra-op TEE with dsignificant outflow tract obstruction - AI, MR & TR   Insomnia    Mild aortic sclerosis  Osteopenia    Peripheral vascular disease (HCC)    Syncope    , Vagal   Past Surgical History:  Procedure Laterality Date   AUGMENTATION MAMMAPLASTY Bilateral    BACK SURGERY     CARDIAC CATHETERIZATION N/A 05/07/2016   Procedure: Left Heart Cath and Coronary Angiography;  Surgeon: Burnell Blanks, MD;  Location: [facility] CV LAB;  Service: Cardiovascular;  Laterality: N/A;   CARDIOVERSION N/A 09/24/2017   Procedure: CARDIOVERSION;  Surgeon: Larey Dresser, MD;  Location: [facility] ENDOSCOPY;  Service: Cardiovascular;  Laterality: N/A;   DILATION AND CURETTAGE  OF UTERUS     ENDARTERECTOMY FEMORAL Right 03/02/2015   Procedure: ENDARTERECTOMY RIGHT FEMORAL;  Surgeon: Angelia Mould, MD;  Location: [facility];  Service: Vascular;  Laterality: Right;   ESOPHAGOGASTRODUODENOSCOPY (EGD) WITH PROPOFOL N/A 12/01/2020   Procedure: ESOPHAGOGASTRODUODENOSCOPY (EGD) WITH PROPOFOL;  Surgeon: Jesusita Oka, MD;  Location: [place] ENDOSCOPY;  Service: General;  Laterality: N/A;   FACIAL COSMETIC SURGERY Left 2002   "related to [place] @ age 41; left eye/side of face droopy; tried to make area symmetrical"   FEMORAL-POPLITEAL BYPASS GRAFT Right 03/02/2015   Procedure: BYPASS GRAFT FEMORAL-BELOW KNEE POPLITEAL ARTERY;  Surgeon: Angelia Mould, MD;  Location: [facility];  Service: Vascular;  Laterality: Right;   INGUINAL HERNIA REPAIR Bilateral 2002   IR ANGIO INTRA EXTRACRAN SEL COM CAROTID INNOMINATE UNI L MOD SED  11/16/2020   IR CT HEAD LTD  11/13/2020   IR PERCUTANEOUS ART THROMBECTOMY/INFUSION INTRACRANIAL INC DIAG ANGIO  11/13/2020   OVARIAN CYST REMOVAL Left    PEG PLACEMENT N/A 12/01/2020   Procedure: PERCUTANEOUS ENDOSCOPIC GASTROSTOMY (PEG) PLACEMENT;  Surgeon: Jesusita Oka, MD;  Location: [facility];  Service: General;  Laterality: N/A;   PERIPHERAL VASCULAR CATHETERIZATION N/A 01/16/2015   Procedure: Abdominal Aortogram;  Surgeon: Angelia Mould, MD;  Location: [place] CV LAB;  Service: Cardiovascular;  Laterality: N/A;   POSTERIOR LUMBAR FUSION  2015   "have plates and screws in there"   [employer] [facility] N/A 11/13/2020   Procedure: IR WITH ANESTHESIA;  Surgeon: Radiologist, Medication, MD;  Location: [facility];  Service: Radiology;  Laterality: N/A;   TONSILLECTOMY      Allergies  Allergen Reactions   Amoxicillin Other (See Comments)    UTI Has patient had a PCN reaction causing immediate rash, facial/tongue/throat swelling, SOB or lightheadedness with hypotension: No Has patient had a PCN reaction causing severe rash involving  mucus membranes or skin necrosis: No Has patient had a PCN reaction that required hospitalization: No Has patient had a PCN reaction occurring within the last 10 years: Yes--UTI ONLY If all of the above answers are "NO", then may proceed with Cephalosporin use.    Atenolol Cough   Crestor [Rosuvastatin Calcium] Other (See Comments)    Muscle aches   Pravastatin Other (See Comments)    Muscle aches   Sulfa Antibiotics Nausea Only   Codeine Other (See Comments)    hallucinations    Allergies as of 06/18/2021       Reactions   Amoxicillin Other (See Comments)   UTI Has patient had a PCN reaction causing immediate rash, facial/tongue/throat swelling, SOB or lightheadedness with hypotension: No Has patient had a PCN reaction causing severe rash involving mucus membranes or skin necrosis: No Has patient had a PCN reaction that required hospitalization: No Has patient had a PCN reaction occurring within the last 10 years: Yes--UTI ONLY If all of the above  answers are "NO", then may proceed with Cephalosporin use.   Atenolol Cough   Crestor [rosuvastatin Calcium] Other (See Comments)   Muscle aches   Pravastatin Other (See Comments)   Muscle aches   Sulfa Antibiotics Nausea Only   Codeine Other (See Comments)   hallucinations        Medication List        Accurate as of June 18, 2021 11:37 AM. If you have any questions, ask your nurse or doctor.          acetaminophen 325 MG tablet Commonly known as: TYLENOL Take 650 mg by mouth every 4 (four) hours as needed for mild Li.   amiodarone 200 MG tablet Commonly known as: PACERONE Place 1 tablet (200 mg total) into feeding tube daily.   amLODipine 5 MG tablet Commonly known as: NORVASC Take 5 mg by mouth daily. In the morning 8-11 am   apixaban 5 MG Tabs tablet Commonly known as: ELIQUIS Place 1 tablet (5 mg total) into feeding tube 2 (two) times daily.   budesonide 0.25 MG/2ML nebulizer solution Commonly known as:  PULMICORT Take 0.25 mg by nebulization 2 (two) times daily. Rinse mouth after use   buPROPion ER 100 MG 12 hr tablet Commonly known as: WELLBUTRIN SR Take 100 mg by mouth 2 (two) times daily.   ezetimibe 10 MG tablet Commonly known as: ZETIA Place 1 tablet (10 mg total) into feeding tube daily.   free water Soln Place 250 mLs into feeding tube 6 (six) times daily.   furosemide 20 MG tablet Commonly known as: LASIX Take 1 tablet (20 mg total) by mouth daily.   hydrALAZINE 10 MG tablet Commonly known as: APRESOLINE Take 10 mg by mouth 3 (three) times daily as needed. SBP>165 or DBP >95   ipratropium-albuterol 0.5-2.5 (3) MG/3ML Soln Commonly known as: DUONEB Take 3 mLs by nebulization every 4 (four) hours as needed.   metoprolol tartrate 50 MG tablet Commonly known as: LOPRESSOR Place 1 tablet (50 mg total) into feeding tube 2 (two) times daily.   nystatin cream Commonly known as: MYCOSTATIN Apply 1 application topically 2 (two) times daily as needed for dry skin (apply to perirectal rash).   OXYGEN Inhale 3 L into the lungs as directed. to maintain sats > or equal to 90%. May titrate Three Times A Day; 07:00 AM - 03:00 PM, 03:00 PM - 11:00 PM, 11:00 PM - 7:00 am   pantoprazole sodium 40 mg/20 mL Susp Commonly known as: PROTONIX Place 40 mg into feeding tube 2 (two) times daily.   Systane 0.4-0.3 % Soln Generic drug: Polyethyl Glycol-Propyl Glycol Apply 2 drops to eye 4 (four) times daily as needed.   zinc oxide 20 % ointment Apply 1 application topically as needed for irritation (apply to perirectal area prn - use with nystatin).        Review of Systems  Constitutional:  Negative for activity change and appetite change.  HENT: Negative.    Eyes:  Positive for discharge and itching. Negative for photophobia, redness and visual disturbance.  Respiratory:  Negative for cough and shortness of breath.   Cardiovascular:  Negative for leg swelling.   Gastrointestinal:  Negative for constipation.  Genitourinary: Negative.   Musculoskeletal:  Positive for gait problem. Negative for arthralgias and myalgias.  Skin: Negative.   Neurological:  Positive for weakness. Negative for dizziness.  Psychiatric/Behavioral:  Negative for confusion, dysphoric mood and sleep disturbance.    Immunization History  Administered Date(s) Administered  Influenza Split 01/16/2009, 04/30/2010, 01/26/2016, 02/07/2018, 02/01/2020   Influenza, High Dose Seasonal PF 02/07/2018, 01/25/2019, 02/28/2021   Influenza,inj,Quad PF,6+ Mos 03/06/2012, 01/26/2016, 01/10/2017   Influenza,inj,quad, With Preservative 02/14/2015   Moderna Covid-19 Vaccine Bivalent Booster 85yrs & up 02/28/2021   PFIZER(Purple Top)SARS-COV-2 Vaccination 05/06/2019, 05/27/2019, 01/27/2020, 09/18/2020   Pneumococcal Conjugate-13 03/19/2017   Pneumococcal Polysaccharide-23 02/14/2015   Td 02/25/2005   Tdap 05/19/2013   Zoster, Live 07/28/2006   Pertinent  Health Maintenance Due  Topic Date Due   INFLUENZA VACCINE  Completed   DEXA SCAN  Discontinued   Fall Risk 01/22/2021 01/23/2021 01/23/2021 02/02/2021 02/02/2021  Falls in the past year? - - - - -  Was there an injury with Fall? - - - - -  Fall Risk Category Calculator - - - - -  Fall Risk Category - - - - -  Patient Fall Risk Level High fall risk High fall risk High fall risk High fall risk High fall risk  Patient at Risk for Falls Due to - - - - -  Fall risk Follow up - - - - -   Functional Status Survey:    Vitals:   06/18/21 1133  BP: 109/73  Pulse: 85  Resp: 19  Temp: (!) 96.6 F (35.9 C)  SpO2: 94%  Weight: 99 lb 9.6 oz (45.2 kg)  Height: 5\' 3"  (1.6 m)   Body mass index is 17.64 kg/m. Physical Exam Vitals reviewed.  Constitutional:      Appearance: Normal appearance.  HENT:     Head: Normocephalic.     Nose: Nose normal.     Mouth/Throat:     Mouth: Mucous membranes are moist.     Pharynx: Oropharynx is  clear.  Eyes:     Pupils: Pupils are equal, round, and reactive to light.     Comments: Conjunctive clear Has Stye in both Lower lid in Right Eye and Left eye  Cardiovascular:     Rate and Rhythm: Normal rate and regular rhythm.     Pulses: Normal pulses.     Heart sounds: Normal heart sounds. No murmur heard. Pulmonary:     Effort: Pulmonary effort is normal.     Breath sounds: Normal breath sounds.  Abdominal:     General: Abdomen is flat. Bowel sounds are normal.     Palpations: Abdomen is soft.  Musculoskeletal:        General: No swelling.     Cervical back: Neck supple.  Skin:    General: Skin is warm.  Neurological:     Mental Status: She is alert and oriented to person, place, and time.     Comments: Has Right Flaccid Hemiparesis Still has Aphasia but improved Left UE and LE is good strength   Psychiatric:        Mood and Affect: Mood normal.        Thought Content: Thought content normal.    Labs reviewed: Recent Labs    11/29/20 0128 11/30/20 5277 12/02/20 0216 12/04/20 0052 01/22/21 8242 01/23/21 0259 01/30/21 0000 02/02/21 0519 03/26/21 0000  NA 151*   < > 140   < > 138 135 143 139 136*  K 4.4   < > 4.2   < > 3.9 3.6 4.0 3.5 3.9  CL 113*   < > 108   < > 98 94* 99 100 100  CO2 30   < > 25   < >  --  28 31* 30 29*  GLUCOSE  140*   < > 181*   < > 106* 85  --  114*  --   BUN 60*   < > 51*   < > 19 14 13   13 21  32*  CREATININE 0.75   < > 0.79   < > 0.90 0.85 0.7   0.7 0.72 0.7  CALCIUM 8.9   < > 8.4*   < >  --  8.7* 9.6   9.6 8.9 8.9  MG 2.3  --  2.2  --   --  2.0  --   --   --   PHOS 3.4  --  3.0  --   --  4.1  --   --   --    < > = values in this interval not displayed.   Recent Labs    12/22/20 0409 01/22/21 0651 01/23/21 0259 03/26/21 0000  AST 35 39 28 23  ALT 47* 38 34 31  ALKPHOS 69 68 57 67  BILITOT 0.6 1.3* 1.5*  --   PROT 5.6* 6.1* 5.6*  --   ALBUMIN 2.6* 3.4* 3.1* 3.3*   Recent Labs    01/15/21 0603 01/22/21 0651 01/22/21 0658  01/23/21 0259 01/30/21 0000 02/02/21 0519 03/26/21 0000  WBC 8.8 10.7*  --  8.9 9.3 9.3 7.5  NEUTROABS 5.5 7.7  --   --   --  7.2  --   HGB 15.3* 15.5*   < > 14.5 15.1 14.4 14.2  HCT 47.5* 48.4*   < > 45.8 48* 45.3 43  MCV 95.2 96.4  --  95.0  --  95.4  --   PLT 234 237  --  195 261 286 241   < > = values in this interval not displayed.   Lab Results  Component Value Date   TSH 1.087 11/18/2020   Lab Results  Component Value Date   HGBA1C 6.0 (H) 11/13/2020   Lab Results  Component Value Date   CHOL 209 (H) 11/13/2020   HDL 70 11/13/2020   LDLCALC 120 (H) 11/13/2020   TRIG 97 11/13/2020   CHOLHDL 3.0 11/13/2020    Significant Diagnostic Results in last 30 days:  No results found.  Assessment/Plan 1. Paroxysmal atrial fibrillation (HCC) On Amiodarone and Eliquis Also on Lopressor  2. Hordeolum externum, unspecified laterality Continue Erythromycin Ointment Warm comresses She has follow up with Opthalmologit No Make up nad trim the Nails  3. PAD (peripheral artery disease) ([place])   4. 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 Still drags her Right Leg  5. Dysphagia, post-stroke Eating Regular Chopped D3 Tube used for Water and supplements Husband wants to wait before removing the tube  6. Essential hypertension BP running Low Has very tenious BP  Will check it QD to get better pic ? Reduce Norvasc if low  7. Pulmonary emphysema, unspecified emphysema type ([place]) On Oxygen and Budesonide  8. Obstructive hypertrophic cardiomyopathy ([place]) Follows with Cardiology On Low dose of Lasix PRN 9. Depression, recurrent ([place]) Doing well on Wellbutrin  10. Mixed hyperlipidemia On Zetia Allergic to statin  11. Gastroesophageal reflux disease, unspecified whether esophagitis present On  Protonix    Family/ staff Communication:   Labs/tests ordered:

## 2021-06-20 DIAGNOSIS — R2689 Other abnormalities of gait and mobility: Secondary | ICD-10-CM | POA: Diagnosis not present

## 2021-06-20 DIAGNOSIS — I6602 Occlusion and stenosis of left middle cerebral artery: Secondary | ICD-10-CM | POA: Diagnosis not present

## 2021-06-20 DIAGNOSIS — I69390 Apraxia following cerebral infarction: Secondary | ICD-10-CM | POA: Diagnosis not present

## 2021-06-20 DIAGNOSIS — I63312 Cerebral infarction due to thrombosis of left middle cerebral artery: Secondary | ICD-10-CM | POA: Diagnosis not present

## 2021-06-20 DIAGNOSIS — I69320 Aphasia following cerebral infarction: Secondary | ICD-10-CM | POA: Diagnosis not present

## 2021-06-20 DIAGNOSIS — R278 Other lack of coordination: Secondary | ICD-10-CM | POA: Diagnosis not present

## 2021-06-20 DIAGNOSIS — I69391 Dysphagia following cerebral infarction: Secondary | ICD-10-CM | POA: Diagnosis not present

## 2021-06-20 DIAGNOSIS — M62561 Muscle wasting and atrophy, not elsewhere classified, right lower leg: Secondary | ICD-10-CM | POA: Diagnosis not present

## 2021-06-20 DIAGNOSIS — I69351 Hemiplegia and hemiparesis following cerebral infarction affecting right dominant side: Secondary | ICD-10-CM | POA: Diagnosis not present

## 2021-06-21 DIAGNOSIS — I69320 Aphasia following cerebral infarction: Secondary | ICD-10-CM | POA: Diagnosis not present

## 2021-06-21 DIAGNOSIS — I6602 Occlusion and stenosis of left middle cerebral artery: Secondary | ICD-10-CM | POA: Diagnosis not present

## 2021-06-21 DIAGNOSIS — I69390 Apraxia following cerebral infarction: Secondary | ICD-10-CM | POA: Diagnosis not present

## 2021-06-21 DIAGNOSIS — I69351 Hemiplegia and hemiparesis following cerebral infarction affecting right dominant side: Secondary | ICD-10-CM | POA: Diagnosis not present

## 2021-06-21 DIAGNOSIS — R278 Other lack of coordination: Secondary | ICD-10-CM | POA: Diagnosis not present

## 2021-06-21 DIAGNOSIS — I63312 Cerebral infarction due to thrombosis of left middle cerebral artery: Secondary | ICD-10-CM | POA: Diagnosis not present

## 2021-06-21 DIAGNOSIS — I69391 Dysphagia following cerebral infarction: Secondary | ICD-10-CM | POA: Diagnosis not present

## 2021-06-21 DIAGNOSIS — R293 Abnormal posture: Secondary | ICD-10-CM | POA: Diagnosis not present

## 2021-06-21 DIAGNOSIS — M6389 Disorders of muscle in diseases classified elsewhere, multiple sites: Secondary | ICD-10-CM | POA: Diagnosis not present

## 2021-06-22 DIAGNOSIS — R278 Other lack of coordination: Secondary | ICD-10-CM | POA: Diagnosis not present

## 2021-06-22 DIAGNOSIS — I69351 Hemiplegia and hemiparesis following cerebral infarction affecting right dominant side: Secondary | ICD-10-CM | POA: Diagnosis not present

## 2021-06-22 DIAGNOSIS — R2689 Other abnormalities of gait and mobility: Secondary | ICD-10-CM | POA: Diagnosis not present

## 2021-06-22 DIAGNOSIS — M62561 Muscle wasting and atrophy, not elsewhere classified, right lower leg: Secondary | ICD-10-CM | POA: Diagnosis not present

## 2021-06-22 DIAGNOSIS — I63312 Cerebral infarction due to thrombosis of left middle cerebral artery: Secondary | ICD-10-CM | POA: Diagnosis not present

## 2021-06-25 DIAGNOSIS — M62561 Muscle wasting and atrophy, not elsewhere classified, right lower leg: Secondary | ICD-10-CM | POA: Diagnosis not present

## 2021-06-25 DIAGNOSIS — R2689 Other abnormalities of gait and mobility: Secondary | ICD-10-CM | POA: Diagnosis not present

## 2021-06-25 DIAGNOSIS — I69351 Hemiplegia and hemiparesis following cerebral infarction affecting right dominant side: Secondary | ICD-10-CM | POA: Diagnosis not present

## 2021-06-25 DIAGNOSIS — I63312 Cerebral infarction due to thrombosis of left middle cerebral artery: Secondary | ICD-10-CM | POA: Diagnosis not present

## 2021-06-25 DIAGNOSIS — R278 Other lack of coordination: Secondary | ICD-10-CM | POA: Diagnosis not present

## 2021-06-26 DIAGNOSIS — R278 Other lack of coordination: Secondary | ICD-10-CM | POA: Diagnosis not present

## 2021-06-26 DIAGNOSIS — I69351 Hemiplegia and hemiparesis following cerebral infarction affecting right dominant side: Secondary | ICD-10-CM | POA: Diagnosis not present

## 2021-06-26 DIAGNOSIS — R293 Abnormal posture: Secondary | ICD-10-CM | POA: Diagnosis not present

## 2021-06-26 DIAGNOSIS — M6389 Disorders of muscle in diseases classified elsewhere, multiple sites: Secondary | ICD-10-CM | POA: Diagnosis not present

## 2021-06-26 DIAGNOSIS — I63312 Cerebral infarction due to thrombosis of left middle cerebral artery: Secondary | ICD-10-CM | POA: Diagnosis not present

## 2021-06-27 DIAGNOSIS — I69320 Aphasia following cerebral infarction: Secondary | ICD-10-CM | POA: Diagnosis not present

## 2021-06-27 DIAGNOSIS — M6389 Disorders of muscle in diseases classified elsewhere, multiple sites: Secondary | ICD-10-CM | POA: Diagnosis not present

## 2021-06-27 DIAGNOSIS — M62561 Muscle wasting and atrophy, not elsewhere classified, right lower leg: Secondary | ICD-10-CM | POA: Diagnosis not present

## 2021-06-27 DIAGNOSIS — I6602 Occlusion and stenosis of left middle cerebral artery: Secondary | ICD-10-CM | POA: Diagnosis not present

## 2021-06-27 DIAGNOSIS — R278 Other lack of coordination: Secondary | ICD-10-CM | POA: Diagnosis not present

## 2021-06-27 DIAGNOSIS — I63312 Cerebral infarction due to thrombosis of left middle cerebral artery: Secondary | ICD-10-CM | POA: Diagnosis not present

## 2021-06-27 DIAGNOSIS — I69390 Apraxia following cerebral infarction: Secondary | ICD-10-CM | POA: Diagnosis not present

## 2021-06-27 DIAGNOSIS — I69351 Hemiplegia and hemiparesis following cerebral infarction affecting right dominant side: Secondary | ICD-10-CM | POA: Diagnosis not present

## 2021-06-27 DIAGNOSIS — R293 Abnormal posture: Secondary | ICD-10-CM | POA: Diagnosis not present

## 2021-06-27 DIAGNOSIS — R2689 Other abnormalities of gait and mobility: Secondary | ICD-10-CM | POA: Diagnosis not present

## 2021-06-27 DIAGNOSIS — I69391 Dysphagia following cerebral infarction: Secondary | ICD-10-CM | POA: Diagnosis not present

## 2021-06-28 DIAGNOSIS — I69390 Apraxia following cerebral infarction: Secondary | ICD-10-CM | POA: Diagnosis not present

## 2021-06-28 DIAGNOSIS — I69391 Dysphagia following cerebral infarction: Secondary | ICD-10-CM | POA: Diagnosis not present

## 2021-06-28 DIAGNOSIS — I69320 Aphasia following cerebral infarction: Secondary | ICD-10-CM | POA: Diagnosis not present

## 2021-06-28 DIAGNOSIS — I6602 Occlusion and stenosis of left middle cerebral artery: Secondary | ICD-10-CM | POA: Diagnosis not present

## 2021-06-29 DIAGNOSIS — R278 Other lack of coordination: Secondary | ICD-10-CM | POA: Diagnosis not present

## 2021-06-29 DIAGNOSIS — I63312 Cerebral infarction due to thrombosis of left middle cerebral artery: Secondary | ICD-10-CM | POA: Diagnosis not present

## 2021-06-29 DIAGNOSIS — I69351 Hemiplegia and hemiparesis following cerebral infarction affecting right dominant side: Secondary | ICD-10-CM | POA: Diagnosis not present

## 2021-06-29 DIAGNOSIS — M62561 Muscle wasting and atrophy, not elsewhere classified, right lower leg: Secondary | ICD-10-CM | POA: Diagnosis not present

## 2021-06-29 DIAGNOSIS — R2689 Other abnormalities of gait and mobility: Secondary | ICD-10-CM | POA: Diagnosis not present

## 2021-07-02 DIAGNOSIS — R2689 Other abnormalities of gait and mobility: Secondary | ICD-10-CM | POA: Diagnosis not present

## 2021-07-02 DIAGNOSIS — I69351 Hemiplegia and hemiparesis following cerebral infarction affecting right dominant side: Secondary | ICD-10-CM | POA: Diagnosis not present

## 2021-07-02 DIAGNOSIS — M62561 Muscle wasting and atrophy, not elsewhere classified, right lower leg: Secondary | ICD-10-CM | POA: Diagnosis not present

## 2021-07-02 DIAGNOSIS — I69320 Aphasia following cerebral infarction: Secondary | ICD-10-CM | POA: Diagnosis not present

## 2021-07-02 DIAGNOSIS — R278 Other lack of coordination: Secondary | ICD-10-CM | POA: Diagnosis not present

## 2021-07-02 DIAGNOSIS — R293 Abnormal posture: Secondary | ICD-10-CM | POA: Diagnosis not present

## 2021-07-02 DIAGNOSIS — I6602 Occlusion and stenosis of left middle cerebral artery: Secondary | ICD-10-CM | POA: Diagnosis not present

## 2021-07-02 DIAGNOSIS — I69391 Dysphagia following cerebral infarction: Secondary | ICD-10-CM | POA: Diagnosis not present

## 2021-07-02 DIAGNOSIS — I63312 Cerebral infarction due to thrombosis of left middle cerebral artery: Secondary | ICD-10-CM | POA: Diagnosis not present

## 2021-07-02 DIAGNOSIS — I69390 Apraxia following cerebral infarction: Secondary | ICD-10-CM | POA: Diagnosis not present

## 2021-07-02 DIAGNOSIS — M6389 Disorders of muscle in diseases classified elsewhere, multiple sites: Secondary | ICD-10-CM | POA: Diagnosis not present

## 2021-07-04 ENCOUNTER — Ambulatory Visit: Payer: Medicare Other | Admitting: Adult Health

## 2021-07-05 DIAGNOSIS — I10 Essential (primary) hypertension: Secondary | ICD-10-CM | POA: Diagnosis not present

## 2021-07-05 LAB — BASIC METABOLIC PANEL
BUN: 37 — AB (ref 4–21)
CO2: 26 — AB (ref 13–22)
Chloride: 104 (ref 99–108)
Creatinine: 0.8 (ref 0.5–1.1)
Glucose: 92
Potassium: 4.4 mEq/L (ref 3.5–5.1)
Sodium: 141 (ref 137–147)

## 2021-07-05 LAB — CBC AND DIFFERENTIAL
HCT: 44 (ref 36–46)
Hemoglobin: 14.6 (ref 12.0–16.0)
Platelets: 224 10*3/uL (ref 150–400)
WBC: 9

## 2021-07-05 LAB — CBC: RBC: 4.9 (ref 3.87–5.11)

## 2021-07-05 LAB — COMPREHENSIVE METABOLIC PANEL: Calcium: 9.3 (ref 8.7–10.7)

## 2021-07-06 ENCOUNTER — Other Ambulatory Visit: Payer: Self-pay

## 2021-07-06 ENCOUNTER — Observation Stay (HOSPITAL_COMMUNITY)
Admission: EM | Admit: 2021-07-06 | Discharge: 2021-07-09 | Disposition: A | Discharge status: Skilled Nursing Facility | Payer: Medicare Other | Attending: Internal Medicine | Admitting: Internal Medicine

## 2021-07-06 ENCOUNTER — Emergency Department (HOSPITAL_COMMUNITY): Payer: Medicare Other

## 2021-07-06 ENCOUNTER — Encounter (HOSPITAL_COMMUNITY): Payer: Self-pay | Admitting: Internal Medicine

## 2021-07-06 DIAGNOSIS — R001 Bradycardia, unspecified: Secondary | ICD-10-CM | POA: Diagnosis not present

## 2021-07-06 DIAGNOSIS — I69351 Hemiplegia and hemiparesis following cerebral infarction affecting right dominant side: Secondary | ICD-10-CM | POA: Diagnosis not present

## 2021-07-06 DIAGNOSIS — Z79899 Other long term (current) drug therapy: Secondary | ICD-10-CM | POA: Insufficient documentation

## 2021-07-06 DIAGNOSIS — I495 Sick sinus syndrome: Secondary | ICD-10-CM | POA: Diagnosis not present

## 2021-07-06 DIAGNOSIS — M62561 Muscle wasting and atrophy, not elsewhere classified, right lower leg: Secondary | ICD-10-CM | POA: Diagnosis not present

## 2021-07-06 DIAGNOSIS — R55 Syncope and collapse: Secondary | ICD-10-CM | POA: Diagnosis not present

## 2021-07-06 DIAGNOSIS — R404 Transient alteration of awareness: Secondary | ICD-10-CM | POA: Diagnosis not present

## 2021-07-06 DIAGNOSIS — I129 Hypertensive chronic kidney disease with stage 1 through stage 4 chronic kidney disease, or unspecified chronic kidney disease: Secondary | ICD-10-CM | POA: Insufficient documentation

## 2021-07-06 DIAGNOSIS — N1832 Chronic kidney disease, stage 3b: Secondary | ICD-10-CM | POA: Insufficient documentation

## 2021-07-06 DIAGNOSIS — I480 Paroxysmal atrial fibrillation: Secondary | ICD-10-CM

## 2021-07-06 DIAGNOSIS — J449 Chronic obstructive pulmonary disease, unspecified: Secondary | ICD-10-CM | POA: Diagnosis present

## 2021-07-06 DIAGNOSIS — I421 Obstructive hypertrophic cardiomyopathy: Secondary | ICD-10-CM

## 2021-07-06 DIAGNOSIS — Z7901 Long term (current) use of anticoagulants: Secondary | ICD-10-CM | POA: Diagnosis not present

## 2021-07-06 DIAGNOSIS — I499 Cardiac arrhythmia, unspecified: Secondary | ICD-10-CM | POA: Diagnosis not present

## 2021-07-06 DIAGNOSIS — I10 Essential (primary) hypertension: Secondary | ICD-10-CM | POA: Diagnosis present

## 2021-07-06 DIAGNOSIS — Z87891 Personal history of nicotine dependence: Secondary | ICD-10-CM | POA: Diagnosis not present

## 2021-07-06 DIAGNOSIS — R778 Other specified abnormalities of plasma proteins: Secondary | ICD-10-CM | POA: Insufficient documentation

## 2021-07-06 DIAGNOSIS — Z8673 Personal history of transient ischemic attack (TIA), and cerebral infarction without residual deficits: Secondary | ICD-10-CM | POA: Diagnosis not present

## 2021-07-06 DIAGNOSIS — R2689 Other abnormalities of gait and mobility: Secondary | ICD-10-CM | POA: Diagnosis not present

## 2021-07-06 DIAGNOSIS — Z20822 Contact with and (suspected) exposure to covid-19: Secondary | ICD-10-CM | POA: Insufficient documentation

## 2021-07-06 DIAGNOSIS — R278 Other lack of coordination: Secondary | ICD-10-CM | POA: Diagnosis not present

## 2021-07-06 DIAGNOSIS — R0902 Hypoxemia: Secondary | ICD-10-CM | POA: Diagnosis not present

## 2021-07-06 DIAGNOSIS — Z743 Need for continuous supervision: Secondary | ICD-10-CM | POA: Diagnosis not present

## 2021-07-06 DIAGNOSIS — R6889 Other general symptoms and signs: Secondary | ICD-10-CM | POA: Diagnosis not present

## 2021-07-06 DIAGNOSIS — I63312 Cerebral infarction due to thrombosis of left middle cerebral artery: Secondary | ICD-10-CM | POA: Diagnosis not present

## 2021-07-06 LAB — CBC WITH DIFFERENTIAL/PLATELET
Abs Immature Granulocytes: 0.05 10*3/uL (ref 0.00–0.07)
Basophils Absolute: 0 10*3/uL (ref 0.0–0.1)
Basophils Relative: 0 %
Eosinophils Absolute: 0.1 10*3/uL (ref 0.0–0.5)
Eosinophils Relative: 1 %
HCT: 45.2 % (ref 36.0–46.0)
Hemoglobin: 14.6 g/dL (ref 12.0–15.0)
Immature Granulocytes: 1 %
Lymphocytes Relative: 20 %
Lymphs Abs: 1.9 10*3/uL (ref 0.7–4.0)
MCH: 30 pg (ref 26.0–34.0)
MCHC: 32.3 g/dL (ref 30.0–36.0)
MCV: 93 fL (ref 80.0–100.0)
Monocytes Absolute: 0.7 10*3/uL (ref 0.1–1.0)
Monocytes Relative: 8 %
Neutro Abs: 6.5 10*3/uL (ref 1.7–7.7)
Neutrophils Relative %: 70 %
Platelets: 241 10*3/uL (ref 150–400)
RBC: 4.86 MIL/uL (ref 3.87–5.11)
RDW: 16.8 % — ABNORMAL HIGH (ref 11.5–15.5)
WBC: 9.3 10*3/uL (ref 4.0–10.5)
nRBC: 0 % (ref 0.0–0.2)

## 2021-07-06 LAB — BLOOD GAS, VENOUS
Acid-Base Excess: 7.5 mmol/L — ABNORMAL HIGH (ref 0.0–2.0)
Bicarbonate: 33.7 mmol/L — ABNORMAL HIGH (ref 20.0–28.0)
O2 Saturation: 58.3 %
Patient temperature: 36.6
pCO2, Ven: 51 mmHg (ref 44–60)
pH, Ven: 7.43 (ref 7.25–7.43)
pO2, Ven: 32 mmHg (ref 32–45)

## 2021-07-06 LAB — TSH: TSH: 0.632 u[IU]/mL (ref 0.350–4.500)

## 2021-07-06 LAB — COMPREHENSIVE METABOLIC PANEL
ALT: 45 U/L — ABNORMAL HIGH (ref 0–44)
AST: 33 U/L (ref 15–41)
Albumin: 3.2 g/dL — ABNORMAL LOW (ref 3.5–5.0)
Alkaline Phosphatase: 76 U/L (ref 38–126)
Anion gap: 4 — ABNORMAL LOW (ref 5–15)
BUN: 30 mg/dL — ABNORMAL HIGH (ref 8–23)
CO2: 30 mmol/L (ref 22–32)
Calcium: 8.8 mg/dL — ABNORMAL LOW (ref 8.9–10.3)
Chloride: 104 mmol/L (ref 98–111)
Creatinine, Ser: 0.98 mg/dL (ref 0.44–1.00)
GFR, Estimated: 60 mL/min — ABNORMAL LOW (ref >60)
Glucose, Bld: 92 mg/dL (ref 70–99)
Potassium: 4.4 mmol/L (ref 3.5–5.1)
Sodium: 138 mmol/L (ref 135–145)
Total Bilirubin: 0.7 mg/dL (ref 0.3–1.2)
Total Protein: 6.2 g/dL — ABNORMAL LOW (ref 6.5–8.1)

## 2021-07-06 LAB — RESP PANEL BY RT-PCR (FLU A&B, COVID) ARPGX2
Influenza A by PCR: NEGATIVE
Influenza B by PCR: NEGATIVE
SARS Coronavirus 2 by RT PCR: NEGATIVE

## 2021-07-06 LAB — TROPONIN I (HIGH SENSITIVITY)
Troponin I (High Sensitivity): 197 ng/L (ref <18)
Troponin I (High Sensitivity): 176 ng/L (ref <18)
Troponin I (High Sensitivity): 185 ng/L (ref <18)

## 2021-07-06 LAB — I-STAT CHEM 8, ED
BUN: 36 mg/dL — ABNORMAL HIGH (ref 8–23)
Calcium, Ion: 1.11 mmol/L — ABNORMAL LOW (ref 1.15–1.40)
Chloride: 102 mmol/L (ref 98–111)
Creatinine, Ser: 1 mg/dL (ref 0.44–1.00)
Glucose, Bld: 88 mg/dL (ref 70–99)
HCT: 45 % (ref 36.0–46.0)
Hemoglobin: 15.3 g/dL — ABNORMAL HIGH (ref 12.0–15.0)
Potassium: 4.4 mmol/L (ref 3.5–5.1)
Sodium: 141 mmol/L (ref 135–145)
TCO2: 31 mmol/L (ref 22–32)

## 2021-07-06 LAB — MAGNESIUM: Magnesium: 2.1 mg/dL (ref 1.7–2.4)

## 2021-07-06 MED ORDER — APIXABAN 5 MG PO TABS
5.0000 mg | ORAL_TABLET | Freq: Two times a day (BID) | ORAL | Status: DC
  Filled 2021-07-06: qty 1

## 2021-07-06 MED ORDER — IPRATROPIUM-ALBUTEROL 0.5-2.5 (3) MG/3ML IN SOLN
3.0000 mL | RESPIRATORY_TRACT | Status: DC | PRN
  Administered 2021-07-07: 3 mL via RESPIRATORY_TRACT
  Filled 2021-07-06: qty 3

## 2021-07-06 MED ORDER — BUDESONIDE 0.25 MG/2ML IN SUSP
0.2500 mg | Freq: Two times a day (BID) | RESPIRATORY_TRACT | Status: DC
  Administered 2021-07-07: 0.25 mg via RESPIRATORY_TRACT
  Filled 2021-07-06 (×3): qty 2

## 2021-07-06 MED ORDER — EZETIMIBE 10 MG PO TABS
10.0000 mg | ORAL_TABLET | Freq: Every day | ORAL | Status: DC
  Administered 2021-07-07 – 2021-07-09 (×3): 10 mg
  Filled 2021-07-06 (×3): qty 1

## 2021-07-06 MED ORDER — POLYVINYL ALCOHOL 1.4 % OP SOLN
2.0000 [drp] | Freq: Four times a day (QID) | OPHTHALMIC | Status: DC | PRN
  Filled 2021-07-06: qty 15

## 2021-07-06 MED ORDER — AMIODARONE HCL 200 MG PO TABS
200.0000 mg | ORAL_TABLET | Freq: Every day | ORAL | Status: DC
  Filled 2021-07-06: qty 1

## 2021-07-06 MED ORDER — SODIUM CHLORIDE 0.9 % IV BOLUS
500.0000 mL | Freq: Once | INTRAVENOUS | Status: AC
  Administered 2021-07-06: 500 mL via INTRAVENOUS

## 2021-07-06 MED ORDER — PANTOPRAZOLE SODIUM 40 MG PO TBEC
40.0000 mg | DELAYED_RELEASE_TABLET | Freq: Two times a day (BID) | ORAL | Status: DC
  Administered 2021-07-06 – 2021-07-09 (×7): 40 mg via ORAL
  Filled 2021-07-06 (×7): qty 1

## 2021-07-06 MED ORDER — HYDRALAZINE HCL 10 MG PO TABS
10.0000 mg | ORAL_TABLET | Freq: Three times a day (TID) | ORAL | Status: DC | PRN
  Administered 2021-07-07: 10 mg via ORAL
  Filled 2021-07-06: qty 1

## 2021-07-06 MED ORDER — PANTOPRAZOLE 2 MG/ML SUSPENSION
40.0000 mg | Freq: Two times a day (BID) | ORAL | Status: DC
  Filled 2021-07-06: qty 20

## 2021-07-06 MED ORDER — ACETAMINOPHEN 160 MG/5ML PO SOLN: 650.0000 mg | ORAL | Status: DC | PRN

## 2021-07-06 MED ORDER — APIXABAN 5 MG PO TABS
5.0000 mg | ORAL_TABLET | Freq: Once | ORAL | Status: AC
  Administered 2021-07-06: 5 mg via ORAL
  Filled 2021-07-06: qty 1

## 2021-07-06 MED ORDER — BUPROPION HCL 100 MG PO TABS
100.0000 mg | ORAL_TABLET | Freq: Two times a day (BID) | ORAL | Status: DC
  Administered 2021-07-06 – 2021-07-09 (×7): 100 mg
  Filled 2021-07-06 (×8): qty 1

## 2021-07-06 MED ORDER — AMLODIPINE BESYLATE 5 MG PO TABS
5.0000 mg | ORAL_TABLET | Freq: Every day | ORAL | Status: DC
  Administered 2021-07-07 – 2021-07-09 (×3): 5 mg
  Filled 2021-07-06 (×3): qty 1

## 2021-07-06 NOTE — Consult Note (Signed)
?Cardiology Consultation:  ? ?Patient ID: Beverly Li ?MRN: [id number]; DOB: 1945-08-21 ? ?Admit date: 07/06/2021 ?Date of Consult: 07/06/2021 ? ?PCP:  Virgie Dad, MD ?  ?[facility] Providers ?Cardiologist:  Candee Furbish, MD  ?Electrophysiologist:  Virl Axe, MD     ? ? ?Patient Profile:  ? ?Beverly Li is a 76 y.o. 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 Regenia Skeeter. ? ?History of Present Illness:  ? ?Beverly Li is seen at bedside. Due to her dysphasia, most of medical history was obtained from medical chart and her husband at bedside. Reports the patient was about to start her routine PT at the rehab center and noticed her heart rate was in the 30-40s, though patient did not complain any new symptoms. So her PT was discontinued and patient was sent here for further evaluation. Pt is able to speak short sentences. She denies dizziness, near-syncope, syncope, lightheadedness, chest discomfort, heart palpitations, dyspnea, nausea, vomiting, diarrhea, pedal edema or any bleeding events. Patient is on room air, currently HR 41bpm with BP 150/82mmHg, no acute distress. ? ?Patient is compliant with her metoprolol 50mg  twice daily for years. Patient says she is able to tell when she is in atrial fibrillation and her last symptomatic (heart palpitations) episode was at least 3 months ago,  ? ?On admission, ?K 4.4, Mg 3.1, Cr 1, troponin 197->176, WBC 9.3 ? ?Past Medical History:  ?Diagnosis Date  ? Anxiety   ? Arthritis   ? "some in my lower back; probably elbows, knees" (11/18/2017)  ? Atrial fibrillation ([place])   ? Bell's palsy   ? when pt. was 76 yrs old, when under stress the  left side of face will droop.  ? Complication of anesthesia   ? "vascular OR 2016; BP bottomed out; couldn't get it regulated; ended up in ICU for DAYS" (11/18/2017)  ? GERD (gastroesophageal reflux disease)   ? History of kidney stones   ? Hypertension   ? Hypertrophic cardiomyopathy ([place])   ? severe LV basilar hypertrophy witn no evidence of significant outflow tract obstruction, EF 65-70%, mild LAE, mild TR, grade 1a diastolic dysfunction [id number] (Dr. Candee Furbish) (Atrial Septal Hypertrophy pattern)-- Intra-op TEE with dsignificant outflow tract obstruction - AI, MR & TR  ? Insomnia   ? Mild aortic sclerosis   ? Osteopenia   ? Peripheral vascular disease ([place])   ? Syncope   ? , Vagal  ? ? ?Past Surgical History:  ?Procedure Laterality Date  ? AUGMENTATION MAMMAPLASTY Bilateral   ? BACK SURGERY    ? CARDIAC CATHETERIZATION N/A 05/07/2016  ? Procedure: Left Heart Cath and Coronary Angiography;  Surgeon: Burnell Blanks, MD;  Location: [facility] CV LAB;  Service: Cardiovascular;  Laterality: N/A;  ? CARDIOVERSION N/A 09/24/2017  ? Procedure: CARDIOVERSION;  Surgeon: Larey Dresser, MD;  Location: [facility] ENDOSCOPY;  Service: Cardiovascular;  Laterality: N/A;  ? DILATION AND CURETTAGE OF UTERUS    ? ENDARTERECTOMY FEMORAL Right 03/02/2015  ? Procedure: ENDARTERECTOMY RIGHT FEMORAL;  Surgeon: Angelia Mould, MD;  Location: [facility];  Service: Vascular;  Laterality: Right;  ? ESOPHAGOGASTRODUODENOSCOPY (EGD) WITH PROPOFOL N/A 12/01/2020  ? Procedure: ESOPHAGOGASTRODUODENOSCOPY (EGD) WITH PROPOFOL;  Surgeon: Jesusita Oka, MD;  Location:  [facility] ENDOSCOPY;  Service: General;  Laterality: N/A;  ? FACIAL COSMETIC SURGERY Left 2002  ? "related to [place] @ age 34; left eye/side of face droopy; tried to make area symmetrical"  ? FEMORAL-POPLITEAL BYPASS GRAFT Right 03/02/2015  ? Procedure: BYPASS GRAFT FEMORAL-BELOW KNEE POPLITEAL ARTERY;  Surgeon: Angelia Mould, MD;  Location: [facility];  Service: Vascular;   Laterality: Right;  ? INGUINAL HERNIA REPAIR Bilateral 2002  ? IR ANGIO INTRA EXTRACRAN SEL COM CAROTID INNOMINATE UNI L MOD SED  11/16/2020  ? IR CT HEAD LTD  11/13/2020  ? IR PERCUTANEOUS ART THROMBECTOMY/INFUSION INTRACRANIAL INC DIAG ANGIO  11/13/2020  ? OVARIAN CYST REMOVAL Left   ? PEG PLACEMENT N/A 12/01/2020  ? Procedure: PERCUTANEOUS ENDOSCOPIC GASTROSTOMY (PEG) PLACEMENT;  Surgeon: Jesusita Oka, MD;  Location: [facility] ENDOSCOPY;  Service: General;  Laterality: N/A;  ? PERIPHERAL VASCULAR CATHETERIZATION N/A 01/16/2015  ? Procedure: Abdominal Aortogram;  Surgeon: Angelia Mould, MD;  Location: [place] [facility] CV LAB;  Service: Cardiovascular;  Laterality: N/A;  ? POSTERIOR LUMBAR FUSION  2015  ? "have plates and screws in there"  ? RADIOLOGY WITH ANESTHESIA N/A 11/13/2020  ? Procedure: IR WITH ANESTHESIA;  Surgeon: Radiologist, Medication, MD;  Location: [facility];  Service: Radiology;  Laterality: N/A;  ? TONSILLECTOMY    ?  ? ?Inpatient Medications: ?Scheduled Meds: ? [START ON 07/07/2021] amiodarone  200 mg Per Tube Daily  ? [START ON 07/07/2021] amLODipine  5 mg Per Tube Daily  ? [START ON 07/07/2021] apixaban  5 mg Per Tube BID  ? budesonide  0.25 mg Nebulization BID  ? buPROPion  100 mg Per Tube BID  ? [START ON 07/07/2021] ezetimibe  10 mg Per Tube Daily  ? pantoprazole sodium  40 mg Per Tube BID  ? ?Continuous Infusions: ? ?PRN Meds: ?acetaminophen, hydrALAZINE, ipratropium-albuterol, polyvinyl alcohol ? ?Allergies:    ?Allergies  ?Allergen Reactions  ? Amoxicillin Other (See Comments)  ?  UTI ?Has patient had a PCN reaction causing immediate rash, facial/tongue/throat swelling, SOB or lightheadedness with hypotension: No ?Has patient had a PCN reaction causing severe rash involving mucus membranes or skin necrosis: No ?Has patient had a PCN reaction that required hospitalization: No ?Has patient had a PCN reaction occurring within the last 10 years: Yes--UTI ONLY ?If all of the above answers are "NO", then may  proceed with Cephalosporin use. ?  ? Atenolol Cough  ? Crestor [Rosuvastatin Calcium] Other (See Comments)  ?  Muscle aches  ? Pravastatin Other (See Comments)  ?  Muscle aches  ? Sulfa Antibiotics Nausea Only  ? Codeine Other (See Comments)  ?  hallucinations  ? ? ?Social History:   ?Social History  ? ?Socioeconomic History  ? Marital status: Married  ?  Spouse name: Not on file  ? Number of children: Not on file  ? Years of education: Not on file  ? Highest education level: Not on file  ?Occupational History  ? Not on file  ?Tobacco Use  ? Smoking status: Former  ?  Packs/day: 1.00  ?  Years: 50.00  ?  Pack years: 50.00  ?  Types: Cigarettes  ?  Quit date: 12/15/2014  ?  Years since quitting: 6.5  ? Smokeless tobacco: Never  ?Vaping Use  ? Vaping Use: Never used  ?Substance and Sexual Activity  ? Alcohol use: Yes  ?  Comment: 11/18/2017 "might have a couple glasses of wine/month; if that"  ? Drug use: Not on file  ?  Sexual activity: Not Currently  ?Other Topics Concern  ? Not on file  ?Social History Narrative  ? Not on file  ? ?Social Determinants of Health  ? ?Financial Resource Strain: Not on file  ?Food Insecurity: Not on file  ?Transportation Needs: Not on file  ?Physical Activity: Not on file  ?Stress: Not on file  ?Social Connections: Not on file  ?Intimate Partner Violence: Not on file  ?  ?Family History:   ? ?Family History  ?Problem Relation Age of Onset  ? Liver cancer Mother   ? Cancer Mother   ?     Liver  ? Hypertension Mother   ? Lung cancer Father   ? Cancer Father   ?     Lung  ? Breast cancer Sister   ? Cancer Sister   ?     Breast  ?  ? ?ROS:  ?Please see the history of present illness.  ? ?All other ROS reviewed and negative.    ? ?Physical Exam/Data:  ? ?Vitals:  ? 07/06/21 1845 07/06/21 1900 07/06/21 1915 07/06/21 2000  ?BP: (!) 146/68 (!) 152/71 (!) 147/78 (!) 158/69  ?Pulse: (!) 42 (!) 43 (!) 42 (!) 43  ?Resp: 14 15 14 16   ?Temp:    98.2 ?F (36.8 ?C)  ?TempSrc:    Oral  ?SpO2: 96% 95% 96% 92%   ?Weight:    48.1 kg  ?Height:    5\' 3"  (1.6 m)  ? ? ?Intake/Output Summary (Last 24 hours) at 07/06/2021 2118 ?Last data filed at 07/06/2021 1831 ?Gross per 24 hour  ?Intake 500 ml  ?Output --  ?Net 500 ml

## 2021-07-06 NOTE — ED Provider Notes (Signed)
?[facility] ?Provider Note ? ? ?CSN: [id number] ?Arrival date & time: 07/06/21  1520 ? ?  ? ?History ? ?Chief Complaint  ?Patient presents with  ? Bradycardia  ? ? ?Beverly Li is a 76 y.o. female. ? ?HPI ?76 year old female presents with bradycardia.  She has a complex history including some mild aortic sclerosis, hypertrophic cardiomyopathy, Beverly tube which is currently not being used, paroxysmal A-fib and prior stroke.  She is on Eliquis.  Husband does most of the history and indicates that they were doing some physical therapy at her facility and she only went about halfway of the normal distance she walks with the therapist.  [name] like she was feeling weak and tired.  When they sat her back down her heart rate was in the low 40s.  At 1 point one of the nursing staff got a heart rate of 31.  Patient never passed out.  She seems to be fine right now and has no complaints.  Complained of a little bit of abdominal discomfort a couple days ago but that seems to be gone.  Staff at the facility was concerned she might be dehydrated.  Never seemed to complain of any chest pain.  She recently has had lower blood pressures than normal and there was talk that she was going to have her metoprolol dose, which is 50 mg twice daily, cut in half but he is not sure if this had happened yet.  No recent adjustments to her medicines otherwise. ? ?Home Medications ?Prior to Admission medications   ?Medication Sig Start Date End Date Taking? Authorizing Provider  ?budesonide (PULMICORT) 0.5 MG/2ML nebulizer solution Take 0.5 mg by nebulization 2 (two) times daily.   Yes [provider]  ?pantoprazole (PROTONIX) 40 MG tablet Take 40 mg by mouth 2 (two) times daily.   Yes [provider]  ?acetaminophen (TYLENOL) 325 MG tablet Take 650 mg by mouth every 4 (four) hours as needed for mild pain.    [provider]  ?amiodarone (PACERONE) 200 MG tablet Place 1 tablet (200  mg total) into feeding tube daily. ?Patient taking differently: Place 200 mg into feeding tube daily. Oral If crushed. 12/06/20   Danford, Suann Larry, MD  ?amLODipine (NORVASC) 5 MG tablet Take 5 mg by mouth daily.    [provider]  ?apixaban (ELIQUIS) 5 MG TABS tablet Place 1 tablet (5 mg total) into feeding tube 2 (two) times daily. ?Patient taking differently: Place 5 mg into feeding tube 2 (two) times daily. Oral if crushed 12/06/20   Danford, Suann Larry, MD  ?buPROPion ER [facility] SR) 100 MG 12 hr tablet Take 100 mg by mouth 2 (two) times daily.    [provider]  ?ezetimibe (ZETIA) 10 MG tablet Place 1 tablet (10 mg total) into feeding tube daily. ?Patient taking differently: Place 10 mg into feeding tube daily. Oral if crushed 12/06/20   Danford, Suann Larry, MD  ?furosemide (LASIX) 20 MG tablet Take 1 tablet (20 mg total) by mouth daily. ?Patient taking differently: Take 20 mg by mouth daily as needed for fluid (for weight gain equal to or greater than 3 pounds). 02/19/21   Jerline Pain, MD  ?hydrALAZINE (APRESOLINE) 10 MG tablet Take 10 mg by mouth 3 (three) times daily as needed (SBP > 165 or DBP > 95).    [provider]  ?ipratropium-albuterol (DUONEB) 0.5-2.5 (3) MG/3ML SOLN Take 3 mLs by nebulization every 4 (four) hours as  needed. ?Patient taking differently: Take 3 mLs by nebulization 2 (two) times daily. And as needed every 4 hours for SOB. 01/12/21   Angiulli, Lavon Paganini, PA-C  ?metoprolol tartrate (LOPRESSOR) 50 MG tablet Place 1 tablet (50 mg total) into feeding tube 2 (two) times daily. ?Patient taking differently: Place 50 mg into feeding tube 2 (two) times daily. Oral if crushed 01/12/21   Angiulli, Lavon Paganini, PA-C  ?nystatin cream (MYCOSTATIN) Apply 1 application topically 2 (two) times daily as needed for dry skin (apply to perirectal rash).    [provider]  ?OXYGEN Inhale 3 L into the lungs as directed. to maintain sats > or equal to 90%. May  titrate ?Three Times A Day; 07:00 AM - 03:00 PM, 03:00 PM - 11:00 PM, 11:00 PM - 7:00 am    [provider]  ?Polyethyl Glycol-Propyl Glycol (SYSTANE) 0.4-0.3 % SOLN Place 2 drops into both eyes 4 (four) times daily as needed (irritation).    [provider]  ?Water For Irrigation, Sterile (FREE WATER) SOLN Place 250 mLs into feeding tube 6 (six) times daily. 01/14/21   Angiulli, Lavon Paganini, PA-C  ?zinc oxide 20 % ointment Apply 1 application. topically 2 (two) times daily as needed for irritation (apply to perirectal area prn - use with nystatin).    [provider]  ?   ? ?Allergies    ?Amoxicillin, Atenolol, Crestor [rosuvastatin calcium], Pravastatin, Sulfa antibiotics, and Codeine   ? ?Review of Systems   ?Review of Systems  ?Constitutional:  Negative for fever.  ?Respiratory:  Negative for shortness of breath.   ?Cardiovascular:  Negative for chest pain.  ?Gastrointestinal:  Negative for abdominal pain.  ?Neurological:  Positive for weakness. Negative for headaches.  ? ?Physical Exam ?Updated Vital Signs ?BP (!) 158/69 (BP Location: Right Arm)   Pulse (!) 43   Temp 98.2 ?F (36.8 ?C) (Oral)   Resp 16   Ht 5\' 3"  (1.6 m)   Wt 48.1 kg   LMP  (LMP Unknown)   SpO2 95%   BMI 18.78 kg/m?  ?Physical Exam ?Vitals and nursing note reviewed.  ?Constitutional:   ?   General: She is not in acute distress. ?   Appearance: She is well-developed. She is not ill-appearing or diaphoretic.  ?HENT:  ?   Head: Normocephalic and atraumatic.  ?Cardiovascular:  ?   Rate and Rhythm: Regular rhythm. Bradycardia present.  ?   Pulses:     ?     Radial pulses are 2+ on the right side and 2+ on the left side.  ?   Heart sounds: Normal heart sounds.  ?Pulmonary:  ?   Effort: Pulmonary effort is normal.  ?   Breath sounds: Normal breath sounds.  ?Abdominal:  ?   General: There is no distension.  ?   Palpations: Abdomen is soft.  ?   Tenderness: There is no abdominal tenderness.  ?Musculoskeletal:  ?   Right  lower leg: No edema.  ?   Left lower leg: No edema.  ?Skin: ?   General: Skin is warm and dry.  ?Neurological:  ?   Mental Status: She is alert.  ? ? ?ED Results / Procedures / Treatments   ?Labs ?(all labs ordered are listed, but only abnormal results are displayed) ?Labs Reviewed  ?CBC WITH DIFFERENTIAL/PLATELET - Abnormal; Notable for the following components:  ?    Result Value  ? RDW 16.8 (*)   ? All other components within normal limits  ?  COMPREHENSIVE METABOLIC PANEL - Abnormal; Notable for the following components:  ? BUN 30 (*)   ? Calcium 8.8 (*)   ? Total Protein 6.2 (*)   ? Albumin 3.2 (*)   ? ALT 45 (*)   ? GFR, Estimated 60 (*)   ? Anion gap 4 (*)   ? All other components within normal limits  ?BLOOD GAS, VENOUS - Abnormal; Notable for the following components:  ? Bicarbonate 33.7 (*)   ? Acid-Base Excess 7.5 (*)   ? All other components within normal limits  ?I-STAT CHEM 8, ED - Abnormal; Notable for the following components:  ? BUN 36 (*)   ? Calcium, Ion 1.11 (*)   ? Hemoglobin 15.3 (*)   ? All other components within normal limits  ?TROPONIN I (HIGH SENSITIVITY) - Abnormal; Notable for the following components:  ? Troponin I (High Sensitivity) 197 (*)   ? All other components within normal limits  ?TROPONIN I (HIGH SENSITIVITY) - Abnormal; Notable for the following components:  ? Troponin I (High Sensitivity) 176 (*)   ? All other components within normal limits  ?TROPONIN I (HIGH SENSITIVITY) - Abnormal; Notable for the following components:  ? Troponin I (High Sensitivity) 185 (*)   ? All other components within normal limits  ?RESP PANEL BY RT-PCR (FLU A&B, COVID) ARPGX2  ?MAGNESIUM  ?TSH  ?URINALYSIS, ROUTINE W REFLEX MICROSCOPIC  ?BASIC METABOLIC PANEL  ? ? ?EKG ?EKG Interpretation ? ?Date/Time:  Friday July 06 2021 15:39:38 EDT ?Ventricular Rate:  41 ?PR Interval:  254 ?QRS Duration: 98 ?QT Interval:  607 ?QTC Calculation: 502 ?R Axis:   -12 ?Text Interpretation: Sinus bradycardia Prolonged  PR interval Probable left atrial enlargement LVH with secondary repolarization abnormality Prolonged QT interval Rate is slower, but ST/T changes similar to Oct 2022 Confirmed by Sherwood Gambler [phone]) on 3/24/2

## 2021-07-06 NOTE — ED Triage Notes (Signed)
Pt bib [facility] from [facility] for a near syncopal episode. Pt was doing PT today and staff became aware of pt HR dropping to the 30s. Lowest it got with EMS was 27. No complaints of dizziness, CP, or SOB. Nursing staff says pt maintains anywhere from 60-80 HR. Pt does take metoprolol and took her dose this morning, 50mg  at 0800. Pt has hx of CVA, CHF,and afib. Pt taking eliquis. ? ?EMS vitals ?136/72 BP ?36 HR ?15 RR ?94% 2L [place] ?18g Lt AC ?250cc IVF ?

## 2021-07-06 NOTE — H&P (Signed)
?History and Physical  ? ? ?Beverly Li [id number] DOB: 1945/11/26 DOA: 07/06/2021 ? ?PCP: Virgie Dad, MD (Confirm with patient/family/NH records and if not entered, this has to be entered at [facility] point of entry) ?Patient coming from: SNF ? ?I have personally briefly reviewed patient's old medical records in [place] ? ?Chief Complaint: Feeling ok ? ?HPI: Beverly Li is a 76 y.o. 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 ? ?Patient has been in the nursing home for rehab since August 2022 after stroke, she has a rescue right-sided weakness and aphasia.  She does physical therapy Monday Wednesday Friday.  Today while at physical therapy, monitor showed her heart rate dropped to 34, confirmed by EKG.  Her blood pressure remained stable at 110s, pulse ox 95% on room air.  Patient denied any lightheadedness, no palpitations no feeling of nauseous or vomiting, and she denied any chest pains.  She has been taking metoprolol 50 mg twice daily chronically. ? ?ED Course: Heart rate 42, blood pressure systolic [id number].  EKG sinus bradycardia.  Troponin first at 197. ? ?Review of Systems: As per HPI otherwise 14 point review of systems negative.  ? ? ?Past Medical History:  ?Diagnosis Date  ? Anxiety   ? Arthritis   ? "some in my lower back; probably elbows, knees" (11/18/2017)  ? Atrial fibrillation ([place])   ? Bell's palsy   ? when pt. was 76 yrs old, when under stress the left side of face will droop.  ? Complication of anesthesia   ? "vascular OR 2016; BP bottomed out; couldn't get it regulated; ended up in ICU for DAYS" (11/18/2017)  ? GERD (gastroesophageal reflux disease)   ? History of kidney stones   ? Hypertension   ? Hypertrophic cardiomyopathy ([place])   ? severe LV basilar hypertrophy witn no evidence  of significant outflow tract obstruction, EF 65-70%, mild LAE, mild TR, grade 1a diastolic dysfunction [id number] (Dr. Candee Furbish) (Atrial Septal Hypertrophy pattern)-- Intra-op TEE with dsignificant outflow tract obstruction - AI, MR & TR  ? Insomnia   ? Mild aortic sclerosis   ? Osteopenia   ? Peripheral vascular disease ([place])   ? Syncope   ? , Vagal  ? ? ?Past Surgical History:  ?Procedure Laterality Date  ? AUGMENTATION MAMMAPLASTY Bilateral   ? BACK SURGERY    ? CARDIAC CATHETERIZATION N/A 05/07/2016  ? Procedure: Left Heart Cath and Coronary Angiography;  Surgeon: Burnell Blanks, MD;  Location: [facility];  Service: Cardiovascular;  Laterality: N/A;  ? CARDIOVERSION N/A 09/24/2017  ? Procedure: CARDIOVERSION;  Surgeon: Larey Dresser, MD;  Location: [facility] ENDOSCOPY;  Service: Cardiovascular;  Laterality: N/A;  ? DILATION AND CURETTAGE OF UTERUS    ? ENDARTERECTOMY FEMORAL Right 03/02/2015  ? Procedure: ENDARTERECTOMY RIGHT FEMORAL;  Surgeon: Angelia Mould, MD;  Location: [facility];  Service: Vascular;  Laterality: Right;  ? ESOPHAGOGASTRODUODENOSCOPY (EGD) WITH PROPOFOL N/A 12/01/2020  ? Procedure: ESOPHAGOGASTRODUODENOSCOPY (EGD) WITH PROPOFOL;  Surgeon: Jesusita Oka, MD;  Location: [facility] ENDOSCOPY;  Service: General;  Laterality: N/A;  ? FACIAL COSMETIC SURGERY Left 2002  ? "related to [place] @ age 37; left eye/side of face droopy; tried to make area symmetrical"  ? FEMORAL-POPLITEAL BYPASS GRAFT Right 03/02/2015  ? Procedure: BYPASS  GRAFT FEMORAL-BELOW KNEE POPLITEAL ARTERY;  Surgeon: Angelia Mould, MD;  Location: [facility];  Service: Vascular;  Laterality: Right;  ? INGUINAL HERNIA REPAIR Bilateral 2002  ? IR ANGIO INTRA EXTRACRAN SEL COM CAROTID INNOMINATE UNI L MOD SED  11/16/2020  ? IR CT HEAD LTD  11/13/2020  ? IR PERCUTANEOUS ART THROMBECTOMY/INFUSION INTRACRANIAL INC DIAG ANGIO  11/13/2020  ? OVARIAN CYST REMOVAL Left   ? PEG PLACEMENT N/A 12/01/2020  ? Procedure: PERCUTANEOUS  ENDOSCOPIC GASTROSTOMY (PEG) PLACEMENT;  Surgeon: Jesusita Oka, MD;  Location: [facility] ENDOSCOPY;  Service: General;  Laterality: N/A;  ? PERIPHERAL VASCULAR CATHETERIZATION N/A 01/16/2015  ? Procedure: Abdominal Aortogram;  Surgeon: Angelia Mould, MD;  Location: [place] [facility];  Service: Cardiovascular;  Laterality: N/A;  ? POSTERIOR LUMBAR FUSION  2015  ? "have plates and screws in there"  ? RADIOLOGY WITH ANESTHESIA N/A 11/13/2020  ? Procedure: IR WITH ANESTHESIA;  Surgeon: Radiologist, Medication, MD;  Location: [facility];  Service: Radiology;  Laterality: N/A;  ? TONSILLECTOMY    ? ? ? reports that she quit smoking about 6 years ago. Her smoking use included cigarettes. She has a 50.00 pack-year smoking history. She has never used smokeless tobacco. She reports current alcohol use. No history on file for drug use. ? ?Allergies  ?Allergen Reactions  ? Amoxicillin Other (See Comments)  ?  UTI ?Has patient had a PCN reaction causing immediate rash, facial/tongue/throat swelling, SOB or lightheadedness with hypotension: No ?Has patient had a PCN reaction causing severe rash involving mucus membranes or skin necrosis: No ?Has patient had a PCN reaction that required hospitalization: No ?Has patient had a PCN reaction occurring within the last 10 years: Yes--UTI ONLY ?If all of the above answers are "NO", then may proceed with Cephalosporin use. ?  ? Atenolol Cough  ? Crestor [Rosuvastatin Calcium] Other (See Comments)  ?  Muscle aches  ? Pravastatin Other (See Comments)  ?  Muscle aches  ? Sulfa Antibiotics Nausea Only  ? Codeine Other (See Comments)  ?  hallucinations  ? ? ?Family History  ?Problem Relation Age of Onset  ? Liver cancer Mother   ? Cancer Mother   ?     Liver  ? Hypertension Mother   ? Lung cancer Father   ? Cancer Father   ?     Lung  ? Breast cancer Sister   ? Cancer Sister   ?     Breast  ? ? ? ?Prior to Admission medications   ?Medication Sig Start Date End Date Taking? Authorizing Provider   ?acetaminophen (TYLENOL) 325 MG tablet Take 650 mg by mouth every 4 (four) hours as needed for mild pain.    [provider]  ?amiodarone (PACERONE) 200 MG tablet Place 1 tablet (200 mg total) into feeding tube daily. 12/06/20   Danford, Suann Larry, MD  ?amLODipine (NORVASC) 5 MG tablet Take 5 mg by mouth daily. In the morning 8-11 am    [provider]  ?apixaban (ELIQUIS) 5 MG TABS tablet Place 1 tablet (5 mg total) into feeding tube 2 (two) times daily. 12/06/20   Danford, Suann Larry, MD  ?budesonide (PULMICORT) 0.25 MG/2ML nebulizer solution Take 0.25 mg by nebulization 2 (two) times daily. Rinse mouth after use    [provider]  ?buPROPion ER (WELLBUTRIN SR) 100 MG 12 hr tablet Take 100 mg by mouth 2 (two) times daily.    [provider]  ?ezetimibe (ZETIA) 10 MG tablet  Place 1 tablet (10 mg total) into feeding tube daily. 12/06/20   Danford, Suann Larry, MD  ?furosemide (LASIX) 20 MG tablet Take 1 tablet (20 mg total) by mouth daily. ?Patient taking differently: Take 20 mg by mouth daily as needed. 02/19/21   Jerline Pain, MD  ?hydrALAZINE (APRESOLINE) 10 MG tablet Take 10 mg by mouth 3 (three) times daily as needed. SBP>165 or DBP >95    [provider]  ?ipratropium-albuterol (DUONEB) 0.5-2.5 (3) MG/3ML SOLN Take 3 mLs by nebulization every 4 (four) hours as needed. 01/12/21   Angiulli, Lavon Paganini, PA-C  ?metoprolol tartrate (LOPRESSOR) 50 MG tablet Place 1 tablet (50 mg total) into feeding tube 2 (two) times daily. 01/12/21   Angiulli, Lavon Paganini, PA-C  ?nystatin cream (MYCOSTATIN) Apply 1 application topically 2 (two) times daily as needed for dry skin (apply to perirectal rash).    [provider]  ?OXYGEN Inhale 3 L into the lungs as directed. to maintain sats > or equal to 90%. May titrate ?Three Times A Day; 07:00 AM - 03:00 PM, 03:00 PM - 11:00 PM, 11:00 PM - 7:00 am    [provider]  ?pantoprazole sodium (PROTONIX) 40 mg/20 mL SUSP  Place 40 mg into feeding tube 2 (two) times daily.    [provider]  ?Polyethyl Glycol-Propyl Glycol (SYSTANE) 0.4-0.3 % SOLN Apply 2 drops to eye 4 (four) times daily as needed.    Provider, Histo

## 2021-07-06 NOTE — ED Notes (Signed)
Pt husband and son at bedside. RN informed of pt having Rt side deficits from previous CVA. Pt does have COPD and wears 2L [place] at [facility]. Pt has peg tub placed as well that is flushed 2 times a day at the nursing facility. It is no longer used for feedings, pt eats, drinks, and takes oral meds. Husband informed RN that [facility] MD was working on adjusting the dosage on pt's BP medications.  ?

## 2021-07-07 ENCOUNTER — Other Ambulatory Visit (HOSPITAL_COMMUNITY): Payer: Medicare Other

## 2021-07-07 DIAGNOSIS — I10 Essential (primary) hypertension: Secondary | ICD-10-CM | POA: Diagnosis not present

## 2021-07-07 DIAGNOSIS — I739 Peripheral vascular disease, unspecified: Secondary | ICD-10-CM | POA: Diagnosis not present

## 2021-07-07 DIAGNOSIS — J449 Chronic obstructive pulmonary disease, unspecified: Secondary | ICD-10-CM

## 2021-07-07 DIAGNOSIS — R001 Bradycardia, unspecified: Secondary | ICD-10-CM | POA: Diagnosis not present

## 2021-07-07 DIAGNOSIS — I480 Paroxysmal atrial fibrillation: Secondary | ICD-10-CM | POA: Diagnosis not present

## 2021-07-07 DIAGNOSIS — I421 Obstructive hypertrophic cardiomyopathy: Secondary | ICD-10-CM | POA: Diagnosis not present

## 2021-07-07 DIAGNOSIS — J439 Emphysema, unspecified: Secondary | ICD-10-CM | POA: Diagnosis not present

## 2021-07-07 LAB — BASIC METABOLIC PANEL
Anion gap: 6 (ref 5–15)
BUN: 26 mg/dL — ABNORMAL HIGH (ref 8–23)
CO2: 27 mmol/L (ref 22–32)
Calcium: 8.7 mg/dL — ABNORMAL LOW (ref 8.9–10.3)
Chloride: 107 mmol/L (ref 98–111)
Creatinine, Ser: 0.79 mg/dL (ref 0.44–1.00)
GFR, Estimated: >60 mL/min (ref >60)
Glucose, Bld: 80 mg/dL (ref 70–99)
Potassium: 4.3 mmol/L (ref 3.5–5.1)
Sodium: 140 mmol/L (ref 135–145)

## 2021-07-07 LAB — TROPONIN I (HIGH SENSITIVITY): Troponin I (High Sensitivity): 197 ng/L (ref <18)

## 2021-07-07 MED ORDER — ERYTHROMYCIN 5 MG/GM OP OINT
1.0000 "application " | TOPICAL_OINTMENT | Freq: Three times a day (TID) | OPHTHALMIC | Status: DC
  Administered 2021-07-07 – 2021-07-09 (×8): 1 via OPHTHALMIC
  Filled 2021-07-07: qty 3.5

## 2021-07-07 MED ORDER — APIXABAN 5 MG PO TABS
5.0000 mg | ORAL_TABLET | Freq: Two times a day (BID) | ORAL | Status: DC
  Administered 2021-07-07 – 2021-07-09 (×5): 5 mg
  Filled 2021-07-07 (×5): qty 1

## 2021-07-07 MED ORDER — APIXABAN 5 MG PO TABS
5.0000 mg | ORAL_TABLET | Freq: Two times a day (BID) | ORAL | Status: DC
  Administered 2021-07-07: 5 mg

## 2021-07-07 NOTE — Progress Notes (Signed)
?PROGRESS NOTE ? ? ? ?Beverly Li  [id number] DOB: 1945-11-03 DOA: 07/06/2021 ?PCP: Virgie Dad, MD  ? ? ?Brief Narrative:  ? ?Beverly Li is a 76 y.o. female with past medical history of paroxysmal atrial fibrillation on Eliquis, hokum, left MCA stroke with right-sided residual weakness and aphasia, peripheral arterial disease, hypertension, CKD stage IIIb, COPD stage III was sent in from the skilled nursing facility for evaluation of new onset bradycardia.  Patient has been receiving physical therapy at the nursing home when she was noted to be severely bradycardic.  She has been taking metoprolol as outpatient.  In the ED patient's heart rate was 42 but her blood pressure was stable.  EKG showed sinus bradycardia troponin was elevated as well.  Patient was then admitted hospital for further evaluation and treatment. ?  ?Assessment and Plan: ? ?Sinus bradycardia. ?Due to her clinical status difficult to a certain whether he is symptomatic but her blood pressure seems to be stable.  We will avoid AV nodal blocking agents including home metoprolol and amiodarone.  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  We will continue to monitor.  Follow cardiology recommendations.  TSH of 0.6. ?  ?Essential hypertension ?Hold metoprolol.  Continue amlodipine and as needed hydralazine for now. ?  ?Elevated troponins ?No mention of chest pain or EKG with significant ischemic changes.  Continue Eliquis.  Cardiology on board.  Check 2D echocardiogram  ? ?History of paroxysmal atrial fibrillation. ?Off nodal blockers at this time.  Continue Eliquis. ?  ?History of HOCM ?Check echocardiogram.  Hold metoprolol. ?  ?CKD stage IIIb ?Avoid Lasix for now.  Latest creatinine of 0.7.  We will continue to monitor. ?  ?COPD Gold stage III ?No acute exacerbation.  Currently on nasal cannula oxygen.  We will continue  bronchodilators. ?  ?Stroke with residual right sided weakness ?Dense hemiplegia on the right side with some mild aphasia.  Was getting rehabilitation at the skilled nursing facility.  Continue Eliquis at this time. ? ? DVT prophylaxis:  ?apixaban (ELIQUIS) tablet 5 mg  ? ?Code Status:   ?  Code Status: Full Code ? ?Disposition: Skilled nursing facility ? ?Status is: Observation ? ?The patient will require care spanning > 2 midnights and should be moved to inpatient because: Severe bradycardia requiring further observation, ? ? Family Communication:  ?Spoke with the patient's husband at bedside. ? ?Consultants:  ?Cardiology ? ?Procedures:  ?None ? ?Antimicrobials:  ?None ? ?Anti-infectives (From admission, onward)  ? ? None  ? ?  ? ? ?Subjective: ?Today, patient was seen and examined at bedside patient denies any chest pain, shortness of breath, dizziness, lightheadedness.  No mention of nausea vomiting abdominal pain.  Patient's husband at bedside. ? ?Objective: ?Vitals:  ? 07/07/21 0051 07/07/21 0456 07/07/21 0751 07/07/21 0817  ?BP: 113/74 (!) 142/71 (!) 157/73 (!) 143/82  ?Pulse: (!) 41 (!) 42 (!) 43   ?Resp: 18 17 16    ?Temp: 98.2 ?F (36.8 ?C) 98 ?F (36.7 ?C) 97.7 ?F (36.5 ?C)   ?TempSrc: Oral Oral Oral   ?SpO2: 95% 94% 94%   ?Weight:      ?Height:      ? ? ?Intake/Output Summary (Last 24 hours) at 07/07/2021 1115 ?Last data filed at 07/07/2021 0600 ?Gross per 24 hour  ?Intake 500 ml  ?Output 250 ml  ?Net 250 ml  ? ?Filed Weights  ?  07/06/21 1542 07/06/21 2000  ?Weight: 45.4 kg 48.1 kg  ? ? ?Physical Examination: ? ?General: Thinly built, not in obvious distress, mild aphasia noted, on nasal cannula oxygen ?HENT:   No scleral pallor or icterus noted. Oral mucosa is moist.  ?Chest:  .  Diminished breath sounds bilaterally.  ?CVS: S1 &S2 heard. No murmur.  Regular rate and rhythm.  Bradycardia noted ?Abdomen: Soft, nontender, nondistended.  Bowel sounds are heard.   ?Extremities: No cyanosis, clubbing or edema.   Peripheral pulses are palpable.  Right hemiplegia ?Psych: Alert, awake and oriented, normal mood ?CNS: Right hemiplegia, facial weakness-chronic ?Skin: Warm and dry.  No rashes noted. ? ?Data Reviewed:  ? ?CBC: ?Recent Labs  ?Lab 07/06/21 ?1605 07/06/21 ?1612  ?WBC 9.3  --   ?NEUTROABS 6.5  --   ?HGB 14.6 15.3*  ?HCT 45.2 45.0  ?MCV 93.0  --   ?PLT 241  --   ? ? ?Basic Metabolic Panel: ?Recent Labs  ?Lab 07/06/21 ?1605 07/06/21 ?1612 07/07/21 ?0800  ?NA 138 141 140  ?K 4.4 4.4 4.3  ?CL 104 102 107  ?CO2 30  --  27  ?GLUCOSE 92 88 80  ?BUN 30* 36* 26*  ?CREATININE 0.98 1.00 0.79  ?CALCIUM 8.8*  --  8.7*  ?MG 2.1  --   --   ? ? ?Liver Function Tests: ?Recent Labs  ?Lab 07/06/21 ?1605  ?AST 33  ?ALT 45*  ?ALKPHOS 76  ?BILITOT 0.7  ?PROT 6.2*  ?ALBUMIN 3.2*  ? ? ? ?Radiology Studies: ?DG Chest Portable 1 View ? ?Result Date: 07/06/2021 ?CLINICAL DATA:  Bradycardia, near syncope EXAM: PORTABLE CHEST 1 VIEW COMPARISON:  02/02/2021 FINDINGS: Single frontal view of the chest demonstrates stable enlargement of the cardiac silhouette. Continued ectasia and atherosclerosis of the thoracic aorta. Chronic areas of scarring or atelectasis are again seen at the right lung base. Diffuse emphysema again noted. No effusion or pneumothorax. No acute bony abnormalities. IMPRESSION: 1. Chronic right basilar scarring or atelectasis. 2. Emphysema. 3. No acute airspace disease. Electronically Signed   By: Randa Ngo M.D.   On: 07/06/2021 17:17   ? ? ? LOS: 0 days  ? ? ?Flora Lipps, MD ?[facility] ?07/07/2021, 11:15 AM  ? ? ?

## 2021-07-07 NOTE — Progress Notes (Addendum)
RT called back to patient room for trouble breathing. Patient had just received her Pulmicort Neb. Upon arrival patient appears short of breath. Breath sounds were diminished with faint exp. Wheezes. This RT suggested a PRN Duoneb. Started treatment but patient's family member took treatment off before it was finished and would not allow RT to get to flowmeter to fix O2 in patient's nose. Over half the medication was still left in the cup. RN made aware. Patient states she feels better now. ?

## 2021-07-07 NOTE — Progress Notes (Signed)
? ?Progress Note ? ?Patient Name: Beverly Li ?Date of Encounter: 07/07/2021 ? ?[facility] Cardiologist: Candee Furbish, MD  ? ?Subjective  ? ?BP 143/82 this morning.  Creatinine 0.79.  Denies any chest pain, dyspnea, lightheadedness ? ?Inpatient Medications  ?  ?Scheduled Meds: ? amLODipine  5 mg Per Tube Daily  ? apixaban  5 mg Per Tube BID  ? budesonide  0.25 mg Nebulization BID  ? buPROPion  100 mg Per Tube BID  ? ezetimibe  10 mg Per Tube Daily  ? pantoprazole  40 mg Oral BID  ? ?Continuous Infusions: ? ?PRN Meds: ?acetaminophen, hydrALAZINE, ipratropium-albuterol, polyvinyl alcohol  ? ?Vital Signs  ?  ?Vitals:  ? 07/07/21 0051 07/07/21 0456 07/07/21 0751 07/07/21 0817  ?BP: 113/74 (!) 142/71 (!) 157/73 (!) 143/82  ?Pulse: (!) 41 (!) 42 (!) 43   ?Resp: 18 17 16    ?Temp: 98.2 ?F (36.8 ?C) 98 ?F (36.7 ?C) 97.7 ?F (36.5 ?C)   ?TempSrc: Oral Oral Oral   ?SpO2: 95% 94% 94%   ?Weight:      ?Height:      ? ? ?Intake/Output Summary (Last 24 hours) at 07/07/2021 1035 ?Last data filed at 07/07/2021 0600 ?Gross per 24 hour  ?Intake 500 ml  ?Output 250 ml  ?Net 250 ml  ? ? ?  07/06/2021  ?  8:00 PM 07/06/2021  ?  3:42 PM 06/18/2021  ? 11:33 AM  ?Last 3 Weights  ?Weight (lbs) 106 lb 0.7 oz 100 lb 99 lb 9.6 oz  ?Weight (kg) 48.1 kg 45.36 kg 45.178 kg  ?   ? ?Telemetry  ?  ?Sinus bradycardia, rate 40s- Personally Reviewed ? ?ECG  ?  ?No new EKG - Personally Reviewed ? ?Physical Exam  ? ?GEN: No acute distress.   ?Neck: No JVD ?Cardiac: Bradycardic, regular, no murmurs, rubs, or gallops.  ?Respiratory: Clear to auscultation bilaterally. ?GI: Soft, nontender, non-distended  ?MS: No edema; No deformity. ?Neuro: Right-sided weakness ?Psych: Normal affect  ? ?Labs  ?  ?High Sensitivity Troponin:   ?Recent Labs  ?Lab 07/06/21 ?1605 07/06/21 ?1832 07/06/21 ?2059 07/07/21 ?0800  ?TROPONINIHS 197* 176* 185* 197*  ?   ?Chemistry ?Recent Labs  ?Lab 07/06/21 ?1605 07/06/21 ?1612 07/07/21 ?0800  ?NA 138 141 140  ?K 4.4 4.4 4.3  ?CL 104 102  107  ?CO2 30  --  27  ?GLUCOSE 92 88 80  ?BUN 30* 36* 26*  ?CREATININE 0.98 1.00 0.79  ?CALCIUM 8.8*  --  8.7*  ?MG 2.1  --   --   ?PROT 6.2*  --   --   ?ALBUMIN 3.2*  --   --   ?AST 33  --   --   ?ALT 45*  --   --   ?ALKPHOS 76  --   --   ?BILITOT 0.7  --   --   ?GFRNONAA 60*  --  >60  ?ANIONGAP 4*  --  6  ?  ?Lipids No results for input(s): CHOL, TRIG, HDL, LABVLDL, LDLCALC, CHOLHDL in the last 168 hours.  ?Hematology ?Recent Labs  ?Lab 07/06/21 ?1605 07/06/21 ?1612  ?WBC 9.3  --   ?RBC 4.86  --   ?HGB 14.6 15.3*  ?HCT 45.2 45.0  ?MCV 93.0  --   ?MCH 30.0  --   ?MCHC 32.3  --   ?RDW 16.8*  --   ?PLT 241  --   ? ?Thyroid  ?Recent Labs  ?Lab 07/06/21 ?2059  ?TSH 0.632  ?  ?  BNPNo results for input(s): BNP, PROBNP in the last 168 hours.  ?DDimer No results for input(s): DDIMER in the last 168 hours.  ? ?Radiology  ?  ?DG Chest Portable 1 View ? ?Result Date: 07/06/2021 ?CLINICAL DATA:  Bradycardia, near syncope EXAM: PORTABLE CHEST 1 VIEW COMPARISON:  02/02/2021 FINDINGS: Single frontal view of the chest demonstrates stable enlargement of the cardiac silhouette. Continued ectasia and atherosclerosis of the thoracic aorta. Chronic areas of scarring or atelectasis are again seen at the right lung base. Diffuse emphysema again noted. No effusion or pneumothorax. No acute bony abnormalities. IMPRESSION: 1. Chronic right basilar scarring or atelectasis. 2. Emphysema. 3. No acute airspace disease. Electronically Signed   By: Randa Ngo M.D.   On: 07/06/2021 17:17   ? ?Cardiac Studies  ? ? ? ?Patient Profile  ?   ?76 y.o. 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 ?Assessment & Plan  ?  ?#Sinus bradycardia ?-Given her baseline physical conditions, it's hard to know if  patient is really asymptomatic. Fortunately, her BP is stable and actually on the high end on admission. Reviewed her telemetry which showed sinus bradycardia so far ?-continue Telemetry ?-optimize her electrolytes as needed ?-Limit any possible vagal triggers ?-Hold any AVN blocking agents (her home metoprolol and amiodarone) ?-No need for temp pacing or permanent pacing at this time ?-keep atropine at bedside for unstable bradycardia.  ?-f/u TTE  ?-Will monitor for improvement in heart rates with holding metoprolol and amiodarone.  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  Will monitor off on metoprolol/amiodarone and likely plan EP consult ?  ?#Paroxysmal atrial fibrillation  ?-Holding home amiodarone and metoprolol ?-continue eliquis ?  ?#Obstructive hypertrophic cardiomyopathy ?-euvolemic on exam ?-continue to monitor her fluid status ?-metoprolol has been held ?  ?#Troponin elevation: Troponin 197 > 176 > 185 > 197.  Flat troponin not consistent with acute coronary syndrome.  She denies any chest pain ? ?For questions or updates, please contact [place] ?Please consult [email or web address] for contact info under  ? ?  ?   ?Signed, ?Donato Heinz, MD  ?07/07/2021, 10:35 AM   ? ?

## 2021-07-08 ENCOUNTER — Observation Stay (HOSPITAL_BASED_OUTPATIENT_CLINIC_OR_DEPARTMENT_OTHER): Payer: Medicare Other

## 2021-07-08 DIAGNOSIS — R778 Other specified abnormalities of plasma proteins: Secondary | ICD-10-CM | POA: Diagnosis not present

## 2021-07-08 DIAGNOSIS — R001 Bradycardia, unspecified: Secondary | ICD-10-CM | POA: Diagnosis not present

## 2021-07-08 DIAGNOSIS — J449 Chronic obstructive pulmonary disease, unspecified: Secondary | ICD-10-CM | POA: Diagnosis not present

## 2021-07-08 DIAGNOSIS — I480 Paroxysmal atrial fibrillation: Secondary | ICD-10-CM | POA: Diagnosis not present

## 2021-07-08 DIAGNOSIS — I421 Obstructive hypertrophic cardiomyopathy: Secondary | ICD-10-CM | POA: Diagnosis not present

## 2021-07-08 DIAGNOSIS — I10 Essential (primary) hypertension: Secondary | ICD-10-CM | POA: Diagnosis not present

## 2021-07-08 LAB — ECHOCARDIOGRAM COMPLETE
Area-P 1/2: 3.72 cm2
Height: 63 in
P 1/2 time: 728 msec
S' Lateral: 2.4 cm
Weight: 1696.66 oz

## 2021-07-08 LAB — CBC
HCT: 45.4 % (ref 36.0–46.0)
Hemoglobin: 14.9 g/dL (ref 12.0–15.0)
MCH: 29.9 pg (ref 26.0–34.0)
MCHC: 32.8 g/dL (ref 30.0–36.0)
MCV: 91.2 fL (ref 80.0–100.0)
Platelets: 235 10*3/uL (ref 150–400)
RBC: 4.98 MIL/uL (ref 3.87–5.11)
RDW: 17 % — ABNORMAL HIGH (ref 11.5–15.5)
WBC: 9.8 10*3/uL (ref 4.0–10.5)
nRBC: 0 % (ref 0.0–0.2)

## 2021-07-08 LAB — BASIC METABOLIC PANEL
Anion gap: 8 (ref 5–15)
BUN: 22 mg/dL (ref 8–23)
CO2: 28 mmol/L (ref 22–32)
Calcium: 9 mg/dL (ref 8.9–10.3)
Chloride: 105 mmol/L (ref 98–111)
Creatinine, Ser: 0.71 mg/dL (ref 0.44–1.00)
GFR, Estimated: >60 mL/min (ref >60)
Glucose, Bld: 87 mg/dL (ref 70–99)
Potassium: 4 mmol/L (ref 3.5–5.1)
Sodium: 141 mmol/L (ref 135–145)

## 2021-07-08 LAB — MAGNESIUM: Magnesium: 1.9 mg/dL (ref 1.7–2.4)

## 2021-07-08 MED ORDER — AMIODARONE HCL 200 MG PO TABS
200.0000 mg | ORAL_TABLET | Freq: Every day | ORAL | Status: DC
  Administered 2021-07-08 – 2021-07-09 (×2): 200 mg via ORAL
  Filled 2021-07-08 (×2): qty 1

## 2021-07-08 NOTE — Progress Notes (Signed)
Notified by Alexus, NT that pt refused midnight vitals ?

## 2021-07-08 NOTE — Progress Notes (Signed)
?PROGRESS NOTE ? ? ? ?[name]  [id number] DOB: [birth year] DOA: 07/06/2021 ?PCP: Virgie Dad, MD  ? ? ?Brief Narrative:  ? ?Beverly Li is a 76 y.o. female with past medical history of paroxysmal atrial fibrillation on Eliquis, hokum, left MCA stroke with right-sided residual weakness and aphasia, peripheral arterial disease, hypertension, CKD stage IIIb, COPD stage III was sent in from the skilled nursing facility for evaluation of new onset bradycardia.  Patient has been receiving physical therapy at the nursing home when she was noted to be severely bradycardic.  She has been taking metoprolol as outpatient.  In the ED, patient's heart rate was 42 but her blood pressure was stable.  EKG showed sinus bradycardia troponin was elevated as well.  Patient was then admitted hospital for further evaluation and treatment. ?  ?Assessment and Plan: ? ?Sinus bradycardia. ?Cardiology on board.  Off nodal blockers including metoprolol and amiodarone.  Denies any chest pain, increasing dyspnea or lightheadedness but has history of COPD with baseline mild dyspnea.  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   TSH of 0.6.  Cardiology has spoken with electrophysiology cardiology for concerns of tachybradycardia syndrome and plan is to start on amiodarone to maintain sinus rhythm.  If worsening bradycardia might need pacemaker. ?  ?Essential hypertension ?Hold metoprolol.  Continue amlodipine and as needed hydralazine for now. ?  ?Elevated troponins ?No mention of chest pain or EKG with significant ischemic changes.  Continue Eliquis.  Cardiology on board.  Pending 2D echocardiogram. ? ?History of paroxysmal atrial fibrillation. ?Off nodal blockers at this time.  Continue Eliquis.  Amiodarone will be restarted. ?  ?History of HOCM ?Pending 2D echocardiogram.  Hold metoprolol. ?  ?CKD stage IIIb ?Hold Lasix for now.  Latest  creatinine of 0.7.  We will continue to monitor. ?  ?COPD Gold stage III ?No acute exacerbation.  Currently on nasal cannula oxygen.  We will continue bronchodilators.  Avoid Pulmicort nebulizers since she had increased dyspnea with it. ?  ?Stroke with residual right sided weakness ?Dense hemiplegia on the right side with some mild aphasia.  Was getting rehabilitation at the skilled nursing facility.  Continue Eliquis at this time.  Will need rehabilitation on discharge. ? ? DVT prophylaxis:  ?apixaban (ELIQUIS) tablet 5 mg  ? ?Code Status:   ?  Code Status: Full Code ? ?Disposition: Skilled nursing facility when okay with cardiology. ? ?Status is: Observation ? ?The patient will require care spanning > 2 midnights and should be moved to inpatient because: Severe bradycardia requiring further observation, Cardiology monitoring. ? ? Family Communication:  ?I again spoke with the patient's husband at bedside. ? ?Consultants:  ?Cardiology ? ?Procedures:  ?None ? ?Antimicrobials:  ?None ? ?Anti-infectives (From admission, onward)  ? ? None  ? ?  ? ? ?Subjective: ?Today, patient was seen and examined at bedside.  Had some difficulty breathing after getting Pulmicort nebulizer.  Denies chest pain, dizziness, increasing dyspnea or dizziness.  Patient's husband at bedside. ? ?Objective: ?Vitals:  ? 07/07/21 2112 07/07/21 2137 07/08/21 0449 07/08/21 0902  ?BP:  (!) 142/71 (!) 164/75 (!) 156/86  ?Pulse:  (!) 47 (!) 49   ?Resp:  19 14   ?Temp:  97.9 ?F (36.6 ?C) 97.9 ?F (36.6 ?C)   ?TempSrc:  Oral Axillary   ?SpO2: 93% 96% 97%   ?Weight:      ?Height:      ? ? ?  Intake/Output Summary (Last 24 hours) at 07/08/2021 1132 ?Last data filed at 07/08/2021 0456 ?Gross per 24 hour  ?Intake 240 ml  ?Output 1000 ml  ?Net -760 ml  ? ? ?Filed Weights  ? 07/06/21 1542 07/06/21 2000  ?Weight: 45.4 kg 48.1 kg  ? ? ?Physical Examination: ? ?General: Thinly built, not in obvious distress, mild aphasia, on nasal cannula oxygen ?HENT:   No scleral  pallor or icterus noted. Oral mucosa is moist.  ?Chest:    Diminished breath sounds bilaterally.  No obvious wheezes noted. ?CVS: S1 &S2 heard. No murmur.   Bradycardia noted ?Abdomen: Soft, nontender, nondistended.  Bowel sounds are heard.   ?Extremities: No cyanosis, clubbing or edema.  Peripheral pulses are palpable. ?Psych: Alert, awake and oriented, normal mood ?CNS: Right-sided hemiplegia noted.  Facial weakness noted. ?Skin: Warm and dry.  No rashes noted. ? ? ?Data Reviewed:  ? ?CBC: ?Recent Labs  ?Lab 07/06/21 ?[id number] 07/06/21 ?1612 07/08/21 ?[id number]  ?WBC 9.3  --  9.8  ?NEUTROABS 6.5  --   --   ?HGB 14.6 15.3* 14.9  ?HCT 45.2 45.0 45.4  ?MCV 93.0  --  91.2  ?PLT 241  --  235  ? ? ? ?Basic Metabolic Panel: ?Recent Labs  ?Lab 07/06/21 ?[id number] 07/06/21 ?1612 07/07/21 ?0800 07/08/21 ?0717  ?NA 138 141 140 141  ?K 4.4 4.4 4.3 4.0  ?CL 104 102 107 105  ?CO2 30  --  27 28  ?GLUCOSE 92 88 80 87  ?BUN 30* 36* 26* 22  ?CREATININE 0.98 1.00 0.79 0.71  ?CALCIUM 8.8*  --  8.7* 9.0  ?MG 2.1  --   --  1.9  ? ? ? ?Liver Function Tests: ?Recent Labs  ?Lab 07/06/21 ?1605  ?AST 33  ?ALT 45*  ?ALKPHOS 76  ?BILITOT 0.7  ?PROT 6.2*  ?ALBUMIN 3.2*  ? ? ? ? ?Radiology Studies: ?DG Chest Portable 1 View ? ?Result Date: 07/06/2021 ?CLINICAL DATA:  Bradycardia, near syncope EXAM: PORTABLE CHEST 1 VIEW COMPARISON:  02/02/2021 FINDINGS: Single frontal view of the chest demonstrates stable enlargement of the cardiac silhouette. Continued ectasia and atherosclerosis of the thoracic aorta. Chronic areas of scarring or atelectasis are again seen at the right lung base. Diffuse emphysema again noted. No effusion or pneumothorax. No acute bony abnormalities. IMPRESSION: 1. Chronic right basilar scarring or atelectasis. 2. Emphysema. 3. No acute airspace disease. Electronically Signed   By: Randa Ngo M.D.   On: 07/06/2021 17:17   ? ? ? LOS: 0 days  ? ? ?Flora Lipps, MD ?[facility] ?07/08/2021, 11:32 AM  ? ? ?

## 2021-07-08 NOTE — Progress Notes (Signed)
Echocardiogram ?2D Echocardiogram has been performed. ? Beverly Li ?07/08/2021, 8:44 AM ?

## 2021-07-08 NOTE — Progress Notes (Signed)
? ?Progress Note ? ?Patient Name: Beverly Li ?Date of Encounter: 07/08/2021 ? ?[facility] Cardiologist: Candee Furbish, MD  ? ?Subjective  ? ?BP 156/86 this morning, heart rate remains in 40s.  Creatinine 0.71.  Denies any chest pain, dyspnea, lightheadedness ? ?Inpatient Medications  ?  ?Scheduled Meds: ? amLODipine  5 mg Per Tube Daily  ? apixaban  5 mg Per Tube BID  ? buPROPion  100 mg Per Tube BID  ? erythromycin  1 application. Both Eyes TID  ? ezetimibe  10 mg Per Tube Daily  ? pantoprazole  40 mg Oral BID  ? ?Continuous Infusions: ? ?PRN Meds: ?acetaminophen, hydrALAZINE, ipratropium-albuterol, polyvinyl alcohol  ? ?Vital Signs  ?  ?Vitals:  ? 07/07/21 2112 07/07/21 2137 07/08/21 0449 07/08/21 0902  ?BP:  (!) 142/71 (!) 164/75 (!) 156/86  ?Pulse:  (!) 47 (!) 49   ?Resp:  19 14   ?Temp:  97.9 ?F (36.6 ?C) 97.9 ?F (36.6 ?C)   ?TempSrc:  Oral Axillary   ?SpO2: 93% 96% 97%   ?Weight:      ?Height:      ? ? ?Intake/Output Summary (Last 24 hours) at 07/08/2021 1101 ?Last data filed at 07/08/2021 0456 ?Gross per 24 hour  ?Intake 240 ml  ?Output 1000 ml  ?Net -760 ml  ? ? ? ?  07/06/2021  ?  8:00 PM 07/06/2021  ?  3:42 PM 06/18/2021  ? 11:33 AM  ?Last 3 Weights  ?Weight (lbs) 106 lb 0.7 oz 100 lb 99 lb 9.6 oz  ?Weight (kg) 48.1 kg 45.36 kg 45.178 kg  ?   ? ?Telemetry  ?  ?Sinus bradycardia, rate 40s- Personally Reviewed ? ?ECG  ?  ?No new EKG - Personally Reviewed ? ?Physical Exam  ? ?GEN: No acute distress.   ?Neck: No JVD ?Cardiac: Bradycardic, regular, no murmurs, rubs, or gallops.  ?Respiratory: Clear to auscultation bilaterally. ?GI: Soft, nontender, non-distended  ?MS: No edema; No deformity. ?Neuro: Right-sided weakness ?Psych: Normal affect  ? ?Labs  ?  ?High Sensitivity Troponin:   ?Recent Labs  ?Lab 07/06/21 ?1605 07/06/21 ?1832 07/06/21 ?2059 07/07/21 ?0800  ?TROPONINIHS 197* 176* 185* 197*  ? ?   ?Chemistry ?Recent Labs  ?Lab 07/06/21 ?[id number] 07/06/21 ?1612 07/07/21 ?0800 07/08/21 ?0717  ?NA 138 141 140 141   ?K 4.4 4.4 4.3 4.0  ?CL 104 102 107 105  ?CO2 30  --  27 28  ?GLUCOSE 92 88 80 87  ?BUN 30* 36* 26* 22  ?CREATININE 0.98 1.00 0.79 0.71  ?CALCIUM 8.8*  --  8.7* 9.0  ?MG 2.1  --   --  1.9  ?PROT 6.2*  --   --   --   ?ALBUMIN 3.2*  --   --   --   ?AST 33  --   --   --   ?ALT 45*  --   --   --   ?ALKPHOS 76  --   --   --   ?BILITOT 0.7  --   --   --   ?GFRNONAA 60*  --  >60 >60  ?ANIONGAP 4*  --  6 8  ? ?  ?Lipids No results for input(s): CHOL, TRIG, HDL, LABVLDL, LDLCALC, CHOLHDL in the last 168 hours.  ?Hematology ?Recent Labs  ?Lab 07/06/21 ?[id number] 07/06/21 ?1612 07/08/21 ?[id number]  ?WBC 9.3  --  9.8  ?RBC 4.86  --  4.98  ?HGB 14.6 15.3* 14.9  ?HCT 45.2 45.0 45.4  ?  MCV 93.0  --  91.2  ?MCH 30.0  --  29.9  ?MCHC 32.3  --  32.8  ?RDW 16.8*  --  17.0*  ?PLT 241  --  235  ? ? ?Thyroid  ?Recent Labs  ?Lab 07/06/21 ?2059  ?TSH 0.632  ? ?  ?BNPNo results for input(s): BNP, PROBNP in the last 168 hours.  ?DDimer No results for input(s): DDIMER in the last 168 hours.  ? ?Radiology  ?  ?DG Chest Portable 1 View ? ?Result Date: 07/06/2021 ?CLINICAL DATA:  Bradycardia, near syncope EXAM: PORTABLE CHEST 1 VIEW COMPARISON:  02/02/2021 FINDINGS: Single frontal view of the chest demonstrates stable enlargement of the cardiac silhouette. Continued ectasia and atherosclerosis of the thoracic aorta. Chronic areas of scarring or atelectasis are again seen at the right lung base. Diffuse emphysema again noted. No effusion or pneumothorax. No acute bony abnormalities. IMPRESSION: 1. Chronic right basilar scarring or atelectasis. 2. Emphysema. 3. No acute airspace disease. Electronically Signed   By: Randa Ngo M.D.   On: 07/06/2021 17:17   ? ?Cardiac Studies  ? ? ? ?Patient Profile  ?   ?76 y.o. 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 ?Assessment & Plan  ?  ?#Sinus bradycardia ?-Given her baseline physical conditions, it's hard to know if patient is really asymptomatic. Fortunately, her BP is stable and actually on the high end on admission. Reviewed her telemetry which showed sinus bradycardia so far ?-continue Telemetry ?-optimize her electrolytes as needed ?-Limit any possible vagal triggers ?-Hold any AVN blocking agents (her home metoprolol and amiodarone) ?-No need for temp pacing or permanent pacing at this time ?-keep atropine at bedside for unstable bradycardia.  ?-f/u TTE  ?-Discussed with Dr. Caryl Comes in EP given concern for tachybrady syndrome.  As she is asymptomatic with heart rate in 40s, recommend restarting amiodarone to maintain sinus rhythm.  Would monitor for development of symptoms or worsening bradycardia or recurrence of Afib with RVR, in which case a pacemaker would be needed.  However if remains asymptomatic and amiodarone maintaining sinus rhythm, can hold off on PPM ?  ?#Paroxysmal atrial fibrillation  ?-Holding metoprolol, restart amiodarone as above ?-continue eliquis ?  ?#Obstructive hypertrophic cardiomyopathy ?-euvolemic on exam ?-continue to monitor her fluid status ?-metoprolol has been held ?  ?#Troponin elevation: Troponin 197 > 176 > 185 > 197.  Flat troponin not consistent with acute coronary syndrome.  She denies any chest pain ? ?For questions or updates, please contact [place] ?Please consult [email or web address] for contact info under  ? ?  ?   ?Signed, ?Donato Heinz, MD  ?07/08/2021, 11:01 AM   ? ?

## 2021-07-08 NOTE — Plan of Care (Signed)

## 2021-07-09 ENCOUNTER — Observation Stay (HOSPITAL_BASED_OUTPATIENT_CLINIC_OR_DEPARTMENT_OTHER)
Admit: 2021-07-09 | Discharge: 2021-07-09 | Disposition: A | Discharge status: Home / Self Care | Payer: Medicare Other | Attending: Cardiology | Admitting: Cardiology

## 2021-07-09 DIAGNOSIS — Z743 Need for continuous supervision: Secondary | ICD-10-CM | POA: Diagnosis not present

## 2021-07-09 DIAGNOSIS — R001 Bradycardia, unspecified: Secondary | ICD-10-CM

## 2021-07-09 DIAGNOSIS — I421 Obstructive hypertrophic cardiomyopathy: Secondary | ICD-10-CM

## 2021-07-09 DIAGNOSIS — I499 Cardiac arrhythmia, unspecified: Secondary | ICD-10-CM | POA: Diagnosis not present

## 2021-07-09 DIAGNOSIS — I480 Paroxysmal atrial fibrillation: Secondary | ICD-10-CM | POA: Diagnosis not present

## 2021-07-09 DIAGNOSIS — J439 Emphysema, unspecified: Secondary | ICD-10-CM | POA: Diagnosis not present

## 2021-07-09 DIAGNOSIS — J449 Chronic obstructive pulmonary disease, unspecified: Secondary | ICD-10-CM | POA: Diagnosis not present

## 2021-07-09 DIAGNOSIS — I739 Peripheral vascular disease, unspecified: Secondary | ICD-10-CM

## 2021-07-09 DIAGNOSIS — I10 Essential (primary) hypertension: Secondary | ICD-10-CM | POA: Diagnosis not present

## 2021-07-09 DIAGNOSIS — Z7401 Bed confinement status: Secondary | ICD-10-CM | POA: Diagnosis not present

## 2021-07-09 LAB — RESP PANEL BY RT-PCR (FLU A&B, COVID) ARPGX2
Influenza A by PCR: NEGATIVE
Influenza B by PCR: NEGATIVE
SARS Coronavirus 2 by RT PCR: NEGATIVE

## 2021-07-09 LAB — CBC
HCT: 43.7 % (ref 36.0–46.0)
Hemoglobin: 14.3 g/dL (ref 12.0–15.0)
MCH: 29.7 pg (ref 26.0–34.0)
MCHC: 32.7 g/dL (ref 30.0–36.0)
MCV: 90.7 fL (ref 80.0–100.0)
Platelets: 237 10*3/uL (ref 150–400)
RBC: 4.82 MIL/uL (ref 3.87–5.11)
RDW: 16.7 % — ABNORMAL HIGH (ref 11.5–15.5)
WBC: 9.5 10*3/uL (ref 4.0–10.5)
nRBC: 0 % (ref 0.0–0.2)

## 2021-07-09 LAB — MAGNESIUM: Magnesium: 1.9 mg/dL (ref 1.7–2.4)

## 2021-07-09 LAB — BASIC METABOLIC PANEL
Anion gap: 3 — ABNORMAL LOW (ref 5–15)
BUN: 21 mg/dL (ref 8–23)
CO2: 29 mmol/L (ref 22–32)
Calcium: 8.7 mg/dL — ABNORMAL LOW (ref 8.9–10.3)
Chloride: 109 mmol/L (ref 98–111)
Creatinine, Ser: 0.73 mg/dL (ref 0.44–1.00)
GFR, Estimated: >60 mL/min (ref >60)
Glucose, Bld: 90 mg/dL (ref 70–99)
Potassium: 3.8 mmol/L (ref 3.5–5.1)
Sodium: 141 mmol/L (ref 135–145)

## 2021-07-09 MED ORDER — POTASSIUM CHLORIDE CRYS ER 10 MEQ PO TBCR
20.0000 meq | EXTENDED_RELEASE_TABLET | Freq: Once | ORAL | Status: AC
  Administered 2021-07-09: 20 meq via ORAL
  Filled 2021-07-09: qty 2

## 2021-07-09 MED ORDER — AMLODIPINE BESYLATE 2.5 MG PO TABS: 5.0000 mg | ORAL_TABLET | Freq: Every day | ORAL | Status: DC

## 2021-07-09 MED ORDER — MAGNESIUM OXIDE -MG SUPPLEMENT 400 (240 MG) MG PO TABS
400.0000 mg | ORAL_TABLET | Freq: Once | ORAL | Status: AC
  Administered 2021-07-09: 400 mg via ORAL
  Filled 2021-07-09: qty 1

## 2021-07-09 NOTE — TOC Initial Note (Addendum)
Transition of Care (TOC) - Initial/Assessment Note  ? ? ?Patient Details  ?Name: Beverly Li ?MRN: [id number] ?Date of Birth: 1946/02/06 ? ?Transition of Care (TOC) CM/SW Contact:    ?Milas Gain, LCSWA ?Phone Number: ?07/09/2021, 11:04 AM ? ?Clinical Narrative:                 ? ?Patient comes from [facility] long term. CSW spoke with patients spouse William.Patients plan is to return back to [facility] when medically ready for dc. CSW spoke with Anderson Malta from [facility] who confirmed patient comes from there long term and they can accept patient back when medically ready for dc.CSW will continue to follow and assist with patients dc planning needs. ? ?Expected Discharge Plan: [place] (from [facility] long term) ?Barriers to Discharge: Continued Medical Work up ? ? ?Patient Goals and CMS Choice ?Patient states their goals for this hospitalization and ongoing recovery are:: SNF ?CMS Medicare.gov Compare Post Acute Care list provided to:: Patient Represenative (must comment) (patient and patients spouse) ?Choice offered to / list presented to : Patient, Spouse ? ?Expected Discharge Plan and Services ?Expected Discharge Plan: [place] (from [facility] long term) ?In-house Referral: Clinical Social Work ?  ?  ?Living arrangements for the past 2 months: [place] (from [facility] long term) ?                ?  ?  ?  ?  ?  ?  ?  ?  ?  ?  ? ?Prior Living Arrangements/Services ?Living arrangements for the past 2 months: [place] (from [facility] long term) ?Lives with:: Facility Resident (From [facility] long term) ?Patient language and need for interpreter reviewed:: Yes ?Do you feel safe going back to the place where you live?: Yes      ?Need for Family Participation in Patient Care: Yes (Comment) ?  ?  ?Criminal Activity/Legal Involvement Pertinent to Current Situation/Hospitalization: No - Comment as needed ? ?Activities of  Daily Living ?Home Assistive Devices/Equipment: Oxygen, Wheelchair ?ADL Screening (condition at time of admission) ?Patient's cognitive ability adequate to safely complete daily activities?: No ?Is the patient deaf or have difficulty hearing?: No ?Does the patient have difficulty seeing, even when wearing glasses/contacts?: Yes ?Does the patient have difficulty concentrating, remembering, or making decisions?: No ?Patient able to express need for assistance with ADLs?: No ?Does the patient have difficulty dressing or bathing?: Yes ?Independently performs ADLs?: No ?Does the patient have difficulty walking or climbing stairs?: Yes ?Weakness of Legs: Right ?Weakness of Arms/Hands: Right ? ?Permission Sought/Granted ?Permission sought to share information with : Case Manager, Family Supports, Customer service manager ?Permission granted to share information with : Yes, Verbal Permission Granted ? Share Information with NAME: Willaim ? Permission granted to share info w AGENCY: SNF ? Permission granted to share info w Relationship: spouse ? Permission granted to share info w Contact Information: Gwyndolyn Saxon [phone] ? ?Emotional Assessment ?Appearance:: Appears stated age ?Attitude/Demeanor/Rapport: Gracious ?Affect (typically observed): Calm ?Orientation: : Oriented to Self, Oriented to Place, Oriented to  Time, Oriented to Situation ?Alcohol / Substance Use: Not Applicable ?Psych Involvement: No (comment) ? ?Admission diagnosis:  Bradycardia [R00.1] ?Patient Active Problem List  ? Diagnosis Date Noted  ? Bradycardia 07/06/2021  ? Acute ischemic left MCA stroke ([place]) 01/22/2021  ? Long QT interval   ? 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 Acute on chronic diastolic (congestive) heart failure ([place])   ?  Dysphagia, post-stroke   ? Left middle cerebral artery stroke ([place]) 12/21/2020  ? Sepsis due to pneumonia ([place]) 12/12/2020  ? Dysphagia 12/12/2020  ? GERD (gastroesophageal  reflux disease) 12/12/2020  ? Hypoxia   ? Pressure injury of skin 11/23/2020  ? Protein-calorie malnutrition, severe 11/16/2020  ? Stroke ([place]) 11/13/2020  ? Middle cerebral artery embolism, left 11/13/2020  ? Sinus bradycardia 12/25/2019  ? COPD (chronic obstructive pulmonary disease) ([place]) 12/16/2018  ? Cough 12/16/2018  ? Unintentional weight loss 10/05/2018  ? 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 Visit for monitoring Tikosyn therapy 11/18/2017  ? Closed nondisplaced fracture of head of radius with routine healing 03/03/2017  ? Coronary artery disease involving native coronary artery of native heart without angina pectoris   ? SOB (shortness of breath) 01/26/2016  ? Acute on chronic diastolic CHF (congestive heart failure) ([place]) 03/28/2015  ? PAF (paroxysmal atrial fibrillation) ([place])   ? Essential hypertension   ? S/P femoral-popliteal bypass surgery   ? Obstructive hypertrophic cardiomyopathy ([place]) 03/03/2015  ? PAD (peripheral artery disease) ([place]) 03/02/2015  ? Acute respiratory failure with hypoxia ([place]) 03/02/2015  ? Claudication ([place]) 01/31/2015  ? Former smoker 01/31/2015  ? Hyperlipidemia 01/31/2015  ? Atherosclerosis of native arteries of extremity with intermittent claudication ([place]) 02/10/2014  ? 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 Atrial arrhythmia   ? Mild aortic sclerosis   ? ?PCP:  Virgie Dad, MD ?Pharmacy:   ?[facility] ?[place] [address] ?Phone: [phone] Fax: [phone] ? ? ? ? ?Social Determinants of Health (SDOH) Interventions ?  ? ?Readmission Risk Interventions ?   ? View : No data to display.  ?  ?  ?  ? ? ? ?

## 2021-07-09 NOTE — Assessment & Plan Note (Signed)
COPD Gold stage III. ?Continue nasal cannula oxygen on discharge.  Received nebulizers during hospitalization. Avoid Pulmicort nebulizers since she had increased dyspnea with it. ?

## 2021-07-09 NOTE — Discharge Instructions (Signed)
Wear monitor for 2 weeks.   ?

## 2021-07-09 NOTE — [place] FL2 (Signed)
?[place] MEDICAID FL2 LEVEL OF CARE SCREENING TOOL  ?  ? ?IDENTIFICATION  ?Patient Name: ?Beverly Li Birthdate: 1945/04/17 Sex: female Admission Date (Current Location): ?07/06/2021  ?[place] and [place] Number: ? [place] ?  Facility and Address:  ?[facility], [place] [address], [place], [place] [address] ?     Provider Number: ?[id number]  ?Attending Physician Name and Address:  ?Flora Lipps, MD ? Relative Name and Phone Number:  ?Gwyndolyn Saxon [phone] ?   ?Current Level of Care: ?Hospital Recommended Level of Care: ?[place] Prior Approval Number: ?  ? ?Date Approved/Denied: ?  PASRR Number: ?[id number] ? ?Discharge Plan: ?SNF ?  ? ?Current Diagnoses: ?Patient Active Problem List  ? Diagnosis Date Noted  ? Bradycardia 07/06/2021  ? Acute ischemic left MCA stroke ([place]) 01/22/2021  ? Long QT interval   ? 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 Acute on chronic diastolic (congestive) heart failure ([place])   ? Dysphagia, post-stroke   ? 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 Sepsis due to pneumonia ([place]) 12/12/2020  ? Dysphagia 12/12/2020  ? GERD (gastroesophageal reflux disease) 12/12/2020  ? Hypoxia   ? Pressure injury of skin 11/23/2020  ? Protein-calorie malnutrition, severe 11/16/2020  ? Stroke ([place]) 11/13/2020  ? Middle cerebral artery embolism, left 11/13/2020  ? Sinus bradycardia 12/25/2019  ? COPD (chronic obstructive pulmonary disease) ([place]) 12/16/2018  ? Cough 12/16/2018  ? Unintentional weight loss 10/05/2018  ? 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 Visit for monitoring Tikosyn therapy 11/18/2017  ? Closed nondisplaced fracture of head of radius with routine healing 03/03/2017  ? Coronary artery disease involving native coronary artery of native heart without angina pectoris   ? SOB (shortness of breath) 01/26/2016  ? Acute on chronic diastolic CHF (congestive heart failure) ([place]) 03/28/2015  ? PAF (paroxysmal  atrial fibrillation) ([place])   ? Essential hypertension   ? S/P femoral-popliteal bypass surgery   ? 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[place]) 03/03/2015  ? PAD (peripheral artery disease) ([place]) 03/02/2015  ? Acute respiratory failure with hypoxia ([place]) 03/02/2015  ? Claudication ([place]) 01/31/2015  ? Former smoker 01/31/2015  ? Hyperlipidemia 01/31/2015  ? Atherosclerosis of native arteries of extremity with intermittent claudication ([place]) 02/10/2014  ? 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 Atrial arrhythmia   ? Mild aortic sclerosis   ? ? ?Orientation RESPIRATION BLADDER Height & Weight   ?  ?Self, Time, Situation, Place ? O2 (Nasal Cannula 2 liters) Incontinent, External catheter (External Urinary Catheter) Weight: 106 lb 0.7 oz (48.1 kg) ?Height:  5\' 3"  (160 cm)  ?BEHAVIORAL SYMPTOMS/MOOD NEUROLOGICAL BOWEL NUTRITION STATUS  ?    Continent Diet (Please see discharge summary)  ?AMBULATORY STATUS COMMUNICATION OF NEEDS Skin   ?   (WDl,doesn't speak clearly due to stroke) Other (Comment) (WDL,appropriate for ethnicity, dry, intact) ?  ?  ?  ?    ?     ?     ? ? ?Personal Care Assistance Level of Assistance  ?Bathing, Feeding, Dressing Bathing Assistance: Maximum assistance ?Feeding assistance: Limited assistance (Needs assist, Needs assist sitting up) ?Dressing Assistance: Maximum assistance ?   ? ?Functional Limitations Info  ?Hearing, Sight, Speech Sight Info:  [facility]) ?Hearing Info:  (WDL) ?Speech Info:  (WDL)  ? ? ?SPECIAL CARE FACTORS FREQUENCY  ?    ?  ?  ?  ?  ?  ?  ?   ? ? ?Contractures Contractures Info:  Not present  ? ? ?Additional Factors Info  ?Code Status, Allergies, Psychotropic Code Status Info: FULL ?Allergies Info: Amoxicillin,Atenolol,Crestor (rosuvastatin Calcium),Pravastatin,Sulfa Antibiotics,Codeine ?Psychotropic Info: buPROPion (WELLBUTRIN) tablet 100 mg 2 times daily, ?  ?  ?   ? ?Current Medications (07/09/2021):  This is the current hospital active medication  list ?Current Facility-Administered Medications  ?Medication Dose Route Frequency Provider Last Rate Last Admin  ? acetaminophen (TYLENOL) 160 MG/5ML solution 650 mg  650 mg Per Tube Q4H PRN Wynetta Fines T, MD      ? amiodarone (PACERONE) tablet 200 mg  200 mg Oral Daily Donato Heinz, MD   200 mg at 07/09/21 0841  ? amLODipine (NORVASC) tablet 5 mg  5 mg Per Tube Daily Wynetta Fines T, MD   5 mg at 07/09/21 0841  ? apixaban (ELIQUIS) tablet 5 mg  5 mg Per Tube BID [place], [place], [place]   5 mg at 07/09/21 [id number]  ? buPROPion [facility]) tablet 100 mg  100 mg Per Tube BID Wynetta Fines T, MD   100 mg at 07/09/21 0841  ? erythromycin ophthalmic ointment 1 application.  1 application. Both Eyes TID Flora Lipps, MD   1 application. at 07/09/21 0841  ? ezetimibe (ZETIA) tablet 10 mg  10 mg Per Tube Daily Wynetta Fines T, MD   10 mg at 07/09/21 [id number]  ? hydrALAZINE (APRESOLINE) tablet 10 mg  10 mg Oral TID PRN Lequita Halt, MD   10 mg at 07/07/21 1718  ? ipratropium-albuterol (DUONEB) 0.5-2.5 (3) MG/3ML nebulizer solution 3 mL  3 mL Nebulization Q4H PRN Wynetta Fines T, MD   3 mL at 07/07/21 2111  ? pantoprazole (PROTONIX) EC tablet 40 mg  40 mg Oral BID Wynetta Fines T, MD   40 mg at 07/09/21 [id number]  ? polyvinyl alcohol (LIQUIFILM TEARS) 1.4 % ophthalmic solution 2 drop  2 drop Both Eyes QID PRN Lequita Halt, MD      ? ? ? ?Discharge Medications: ?Please see discharge summary for a list of discharge medications. ? ?Relevant Imaging Results: ? ?Relevant Lab Results: ? ? ?Additional Information ?[phone], Both Covid Vaccines ? ?Milas Gain, LCSWA ? ? ? ? ?

## 2021-07-09 NOTE — TOC Transition Note (Addendum)
Transition of Care (TOC) - CM/SW Discharge Note ? ? ?Patient Details  ?Name: Beverly Li ?MRN: [id number] ?Date of Birth: 1946-02-11 ? ?Transition of Care (TOC) CM/SW Contact:  ?Delilah Shan, LCSWA ?Phone Number: ?07/09/2021, 1:39 PM ? ? ?Clinical Narrative:    ? ?Patient will DC to: [facility]  ? ?Anticipated DC date: 07/09/2021 ? ?Family notified: Chrissie Noa ? ?Transport by: Sharin Mons ? ?? ? ?Per MD patient ready for DC to [facility]. RN, patient, patient's family, and facility notified of DC. Discharge Summary sent to facility. RN given number for report [phone] ask for [facility]. DC packet on chart. Ambulance transport requested for patient. ? ?CSW signing off.  ? ?Final next level of care: Skilled Nursing Facility ?Barriers to Discharge: No Barriers Identified ? ? ?Patient Goals and CMS Choice ?Patient states their goals for this hospitalization and ongoing recovery are:: SNF ?CMS Medicare.gov Compare Post Acute Care list provided to:: Patient Represenative (must comment) (patients spouse) ?Choice offered to / list presented to : Patient, Spouse ? ?Discharge Placement ?  ?           ?Patient chooses bed at: [facility] ?Patient to be transferred to facility by: [facility] ?Name of family member notified: Chrissie Noa ?Patient and family notified of of transfer: 07/09/21 ? ?Discharge Plan and Services ?In-house Referral: Clinical Social Work ?  ?           ?  ?  ?  ?  ?  ?  ?  ?  ?  ?  ? ?Social Determinants of Health (SDOH) Interventions ?  ? ? ?Readmission Risk Interventions ?   ? View : No data to display.  ?  ?  ?  ? ? ? ? ? ?

## 2021-07-09 NOTE — Progress Notes (Signed)
Report given to Beverly Li, Charity fundraiser at [employer]. All questions answered. Patient is awaiting transport via [facility]. ?

## 2021-07-09 NOTE — Assessment & Plan Note (Signed)
Off with beta-blockers.  Amiodarone has been restarted. ?

## 2021-07-09 NOTE — Assessment & Plan Note (Signed)
Cardiology was consulted.  Patient was taken off of beta-blocker but was continued on amiodarone.  Asymptomatic at this time.  TSH was 0.6.   Cardiology has spoken with electrophysiology cardiology for concerns of tachybradycardia syndrome and plan is to start on amiodarone to maintain sinus rhythm. ?

## 2021-07-09 NOTE — Progress Notes (Signed)
Dr. Anne Fu to read ?

## 2021-07-09 NOTE — Hospital Course (Signed)
Beverly Li is a 76 y.o. 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  Patient has been receiving physical therapy at the nursing home when she was noted to be severely bradycardic.  She had been taking metoprolol as outpatient.  In the ED, patient's heart rate was 42 but her blood pressure was stable.  EKG showed sinus bradycardia and troponin was elevated as well.  Patient was then admitted to the hospital for further evaluation and treatment. ?  ?

## 2021-07-09 NOTE — Progress Notes (Addendum)
? ?Progress Note ? ?Patient Name: Beverly Li ?Date of Encounter: 07/09/2021 ? ?[facility] Cardiologist: Candee Furbish, MD  ? ?Subjective  ? ?No pain no SOB no lightheadedness  ? ?Inpatient Medications  ?  ?Scheduled Meds: ? amiodarone  200 mg Oral Daily  ? amLODipine  5 mg Per Tube Daily  ? apixaban  5 mg Per Tube BID  ? buPROPion  100 mg Per Tube BID  ? erythromycin  1 application. Both Eyes TID  ? ezetimibe  10 mg Per Tube Daily  ? pantoprazole  40 mg Oral BID  ? ?Continuous Infusions: ? ?PRN Meds: ?acetaminophen, hydrALAZINE, ipratropium-albuterol, polyvinyl alcohol  ? ?Vital Signs  ?  ?Vitals:  ? 07/08/21 1416 07/08/21 2109 07/09/21 [id number] 07/09/21 0841  ?BP: 140/68 138/75 122/75 (!) 142/77  ?Pulse: (!) 47 (!) 49 (!) 50   ?Resp: 17 16 16    ?Temp: 97.8 ?F (36.6 ?C) 98.7 ?F (37.1 ?C) 97.8 ?F (36.6 ?C)   ?TempSrc: Axillary Oral Axillary   ?SpO2: 94% 94% 93%   ?Weight:      ?Height:      ? ? ?Intake/Output Summary (Last 24 hours) at 07/09/2021 0850 ?Last data filed at 07/09/2021 0630 ?Gross per 24 hour  ?Intake 600 ml  ?Output 800 ml  ?Net -200 ml  ? ? ?  07/06/2021  ?  8:00 PM 07/06/2021  ?  3:42 PM 06/18/2021  ? 11:33 AM  ?Last 3 Weights  ?Weight (lbs) 106 lb 0.7 oz 100 lb 99 lb 9.6 oz  ?Weight (kg) 48.1 kg 45.36 kg 45.178 kg  ?   ? ?Telemetry  ?  ?SB with PNCs to AB 48-58 - Personally Reviewed ? ?ECG  ?  ?No new - Personally Reviewed ? ?Physical Exam  ? ?GEN: No acute distress.  Eating breakfast ?Neck: No JVD ?Cardiac: RRR, no murmurs, rubs, or gallops.  ?Respiratory: Clear to auscultation bilaterally. ?GI: Soft, nontender, non-distended  ?MS: No edema; No deformity. Lt leg cooler than Rt but no pain and no sock on it. ?Neuro:  Nonfocal  ?Psych: Normal affect  ? ?Labs  ?  ?High Sensitivity Troponin:   ?Recent Labs  ?Lab 07/06/21 ?1605 07/06/21 ?1832 07/06/21 ?2059 07/07/21 ?0800  ?TROPONINIHS 197* 176* 185* 197*  ?   ?Chemistry ?Recent Labs  ?Lab 07/06/21 ?[id number] 07/06/21 ?1612 07/07/21 ?0800 07/08/21 ?[id number]  07/09/21 ?0317  ?NA 138   < > 140 141 141  ?K 4.4   < > 4.3 4.0 3.8  ?CL 104   < > 107 105 109  ?CO2 30  --  27 28 29   ?GLUCOSE 92   < > 80 87 90  ?BUN 30*   < > 26* 22 21  ?CREATININE 0.98   < > 0.79 0.71 0.73  ?CALCIUM 8.8*  --  8.7* 9.0 8.7*  ?MG 2.1  --   --  1.9 1.9  ?PROT 6.2*  --   --   --   --   ?ALBUMIN 3.2*  --   --   --   --   ?AST 33  --   --   --   --   ?ALT 45*  --   --   --   --   ?ALKPHOS 76  --   --   --   --   ?BILITOT 0.7  --   --   --   --   ?GFRNONAA 60*  --  >60 >60 >60  ?ANIONGAP 4*  --  6 8 3*  ? < > = values in this interval not displayed.  ?  ?Lipids No results for input(s): CHOL, TRIG, HDL, LABVLDL, LDLCALC, CHOLHDL in the last 168 hours.  ?Hematology ?Recent Labs  ?Lab 07/06/21 ?1605 07/06/21 ?1612 07/08/21 ?0717 07/09/21 ?0317  ?WBC 9.3  --  9.8 9.5  ?RBC 4.86  --  4.98 4.82  ?HGB 14.6 15.3* 14.9 14.3  ?HCT 45.2 45.0 45.4 43.7  ?MCV 93.0  --  91.2 90.7  ?MCH 30.0  --  29.9 29.7  ?MCHC 32.3  --  32.8 32.7  ?RDW 16.8*  --  17.0* 16.7*  ?PLT 241  --  235 237  ? ?Thyroid  ?Recent Labs  ?Lab 07/06/21 ?2059  ?TSH 0.632  ?  ?BNPNo results for input(s): BNP, PROBNP in the last 168 hours.  ?DDimer No results for input(s): DDIMER in the last 168 hours.  ? ?Radiology  ?  ?ECHOCARDIOGRAM COMPLETE ? ?Result Date: 07/08/2021 ?   ECHOCARDIOGRAM REPORT   Patient Name:   Beverly Li Date of Exam: 07/08/2021 Medical Rec #:  [id number]       Height:       63.0 in Accession #:    [id number]      Weight:       106.0 lb Date of Birth:  08-30-1945       BSA:          1.477 m? Patient Age:    76 years        BP:           164/75 mmHg Patient Gender: F               HR:           45 bpm. Exam Location:  Inpatient Procedure: 2D Echo, 3D Echo, Cardiac Doppler and Color Doppler Indications:    Elevated Troponin  History:        Patient has prior history of Echocardiogram examinations, most                 recent 01/23/2021. CHF, PAD, Arrythmias:Atrial Fibrillation;                 Risk Factors:Sleep Apnea and  Hypertension. Hypertrophic                 cardiomyopathy. Bell's palsy.  Sonographer:    Leta Jungling RDCS Referring Phys: [id number] [place] General IMPRESSIONS  1. Left ventricular ejection fraction, by estimation, is 60 to 65%. Left ventricular ejection fraction by 3D volume is 65 %. The left ventricle has normal function. The left ventricle has no regional wall motion abnormalities. There is moderate asymmetric left ventricular hypertrophy of the basal-septal segment. Left ventricular diastolic parameters are consistent with Grade II diastolic dysfunction (pseudonormalization). Elevated left atrial pressure.  2. Right ventricular systolic function is normal. The right ventricular size is normal. There is normal pulmonary artery systolic pressure. The estimated right ventricular systolic pressure is 21.8 mmHg.  3. Left atrial size was moderately dilated.  4. The mitral valve is degenerative. Trivial mitral valve regurgitation. No evidence of mitral stenosis. Moderate mitral annular calcification.  5. The aortic valve is tricuspid. Aortic valve regurgitation is moderate. Aortic valve sclerosis/calcification is present, without any evidence of aortic stenosis.  6. Pulmonic valve regurgitation is mild to moderate.  7. The inferior vena cava is normal in size with greater than 50% respiratory variability, suggesting right atrial pressure of 3 mmHg. FINDINGS  Left  Ventricle: Left ventricular ejection fraction, by estimation, is 60 to 65%. Left ventricular ejection fraction by 3D volume is 65 %. The left ventricle has normal function. The left ventricle has no regional wall motion abnormalities. The left ventricular internal cavity size was small. There is moderate asymmetric left ventricular hypertrophy of the basal-septal segment. Left ventricular diastolic parameters are consistent with Grade II diastolic dysfunction (pseudonormalization). Elevated left atrial pressure. Right Ventricle: The right ventricular size is  normal. No increase in right ventricular wall thickness. Right ventricular systolic function is normal. There is normal pulmonary artery systolic pressure. The tricuspid regurgitant velocity is 2.17 m/s, and  with an assumed right atrial pressure of 3 mmHg, the estimated right ventricular systolic pressure is 123XX123 mmHg. Left Atrium: Left atrial size was moderately dilated. Right Atrium: Right atrial size was normal in size. Pericardium: There is no evidence of pericardial effusion. Mitral Valve: The mitral valve is degenerative in appearance. Moderate mitral annular calcification. Trivial mitral valve regurgitation. No evidence of mitral valve stenosis. Tricuspid Valve: The tricuspid valve is normal in structure. Tricuspid valve regurgitation is mild. Aortic Valve: The aortic valve is tricuspid. Aortic valve regurgitation is moderate. Aortic regurgitation PHT measures 728 msec. Aortic valve sclerosis/calcification is present, without any evidence of aortic stenosis. Pulmonic Valve: The pulmonic valve was not well visualized. Pulmonic valve regurgitation is mild to moderate. Aorta: The aortic root and ascending aorta are structurally normal, with no evidence of dilitation. Venous: The inferior vena cava is normal in size with greater than 50% respiratory variability, suggesting right atrial pressure of 3 mmHg. IAS/Shunts: The interatrial septum was not well visualized.  LEFT VENTRICLE PLAX 2D LVIDd:         3.50 cm         Diastology LVIDs:         2.40 cm         LV e' medial:    3.00 cm/s LV PW:         1.20 cm         LV E/e' medial:  40.7 LV IVS:        1.60 cm         LV e' lateral:   3.75 cm/s LVOT diam:     2.00 cm         LV E/e' lateral: 32.5 LV SV:         82 LV SV Index:   56 LVOT Area:     3.14 cm?        3D Volume EF                                LV 3D EF:    Left                                             ventricul                                             ar  ejection                                             fraction                                             by 3D                                             volume is                                             6

## 2021-07-09 NOTE — Assessment & Plan Note (Signed)
Patient did have repeat 2D echocardiogram during hospitalization.  At this time metoprolol has been discontinued.  We will follow the patient during hospitalization. ?

## 2021-07-09 NOTE — Discharge Summary (Signed)
?Physician Discharge Summary ?  ?Patient: Beverly Li MRN: [id number] DOB: 21-Feb-1946  ?Admit date:     07/06/2021  ?Discharge date: 07/09/21  ?Discharge Physician: Corrie Mckusick Keiaira Donlan  ? ?PCP: Virgie Dad, MD  ? ?Recommendations at discharge:  ? ?Follow-up with your primary care provider at the skilled nursing facility in 3 to 5 days.  Check blood work at that time. ?Patient will need to wear Zio live Patch on discharge for 14 days.  Cardiology to follow the patient for Zio patch monitoring. ? ?Discharge Diagnoses: ?Principal Problem: ?  Bradycardia ?Active Problems: ?  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[place]) ?  PAF (paroxysmal atrial fibrillation) ([place]) ?  Essential hypertension ?  COPD (chronic obstructive pulmonary disease) ([place]) ?  Sinus bradycardia ? ?Resolved Problems: ?  * No resolved hospital problems. * ? ?Hospital Course: ?Beverly Li is a 76 y.o. 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  Patient has been receiving physical therapy at the nursing home when she was noted to be severely bradycardic.  She had been taking metoprolol as outpatient.  In the ED, patient's heart rate was 42 but her blood pressure was stable.  EKG showed sinus bradycardia and troponin was elevated as well.  Patient was then admitted to the hospital for further evaluation and treatment. ?  ? ?Assessment and Plan: ?* Bradycardia ?Cardiology was consulted.  Patient was taken off of beta-blocker but was continued on amiodarone.  Asymptomatic at this time.  TSH was 0.6.   Cardiology has spoken with electrophysiology cardiology for concerns of tachybradycardia syndrome and plan is to start on amiodarone to maintain sinus rhythm.  At this time cardiology  is okay about the patient to continue with amiodarone.  Patient will be prescribed July patch on discharge for 14 days on discharge ? ?Elevated troponins ?No mention of chest pain or EKG with significant ischemic changes.  Continue Eliquis for anticoagulation.  Seen by cardiology during hospitalization..   ?  ?COPD (chronic obstructive pulmonary disease) ([place]) ?COPD Gold stage III. ?Continue nasal cannula oxygen on discharge.  Received nebulizers during hospitalization.  Will resume nebulizers on discharge. ? ?PAF (paroxysmal atrial fibrillation) ([place]) ?Beta-blockers have been discontinued..  Continue on amiodarone.  Follow-up with cardiology as outpatient. ? ?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[place]) ?Patient did have repeat 2D echocardiogram during hospitalization.  At this time metoprolol has been discontinued.   ? ?Stroke with residual right sided weakness ?Dense hemiplegia on the right side with some mild aphasia.  Was getting rehabilitation at the skilled nursing facility.  Continue Eliquis at this time.  Will need rehabilitation on discharge. ? ?Essential hypertension.  Metoprolol on hold.  Continue amlodipine and dose has been increased to twice a day. ? ?Consultants: Cardiology ? ?Procedures performed: None ? ?Disposition: Skilled nursing facility ? ?Diet recommendation:  ?Discharge Diet Orders (From admission, onward)  ? ?  Start     Ordered  ? 07/09/21 0000  Diet - low sodium heart healthy       ? 07/09/21 1253  ? ?  ?  ? ?  ? ?Cardiac diet ? ?DISCHARGE MEDICATION: ?Allergies as of 07/09/2021   ? ?   Reactions  ? Amoxicillin Other (See Comments)  ? UTI ?Has patient had a PCN reaction causing immediate rash,  facial/tongue/throat swelling, SOB or lightheadedness with hypotension: No ?Has patient had a PCN reaction causing severe rash involving mucus membranes or skin necrosis: No ?Has patient had a PCN reaction that required hospitalization: No ?Has patient had a PCN reaction occurring within the  last 10 years: Yes--UTI ONLY ?If all of the above answers are "NO", then may proceed with Cephalosporin use.  ? Atenolol Cough  ? Crestor [rosuvastatin Calcium] Other (See Comments)  ? Muscle aches  ? Pravastatin Other (See Comments)  ? Muscle aches  ? Sulfa Antibiotics Nausea Only  ? Codeine Other (See Comments)  ? hallucinations  ? ?  ? ?  ?Medication List  ?  ? ?STOP taking these medications   ? ?metoprolol tartrate 50 MG tablet ?Commonly known as: LOPRESSOR ?  ? ?  ? ?TAKE these medications   ? ?aluminum-magnesium hydroxide-simethicone 200-200-20 MG/5ML Susp ?Commonly known as: MAALOX ?Take 30 mLs by mouth every 4 (four) hours as needed (heartburn). ?  ?amiodarone 200 MG tablet ?Commonly known as: PACERONE ?Place 1 tablet (200 mg total) into feeding tube daily. ?What changed: additional instructions ?  ?amLODipine 2.5 MG tablet ?Commonly known as: NORVASC ?Take 2 tablets (5 mg total) by mouth daily. ?What changed: how much to take ?  ?apixaban 5 MG Tabs tablet ?Commonly known as: ELIQUIS ?Place 1 tablet (5 mg total) into feeding tube 2 (two) times daily. ?What changed: additional instructions ?  ?bisacodyl 5 MG EC tablet ?Commonly known as: DULCOLAX ?Take 5 mg by mouth 3 (three) times daily as needed for moderate constipation. ?  ?budesonide 0.5 MG/2ML nebulizer solution ?Commonly known as: PULMICORT ?Take 0.5 mg by nebulization 2 (two) times daily. ?  ?buPROPion ER 100 MG 12 hr tablet ?Commonly known as: WELLBUTRIN SR ?Take 100 mg by mouth 2 (two) times daily. ?  ?erythromycin ophthalmic ointment ?Place 1 application. into both eyes in the morning, at noon, in the evening, and at bedtime. ?  ?ezetimibe 10 MG tablet ?Commonly known as: ZETIA ?Place 1 tablet (10 mg total) into feeding tube daily. ?What changed: additional instructions ?  ?free water Soln ?Place 250 mLs into feeding tube 6 (six) times daily. ?  ?furosemide 20 MG tablet ?Commonly known as: LASIX ?Take 1 tablet (20 mg total) by mouth daily. ?What  changed:  ?when to take this ?reasons to take this ?  ?hydrALAZINE 10 MG tablet ?Commonly known as: APRESOLINE ?Take 10 mg by mouth 3 (three) times daily as needed (SBP > 165 or DBP > 95). ?  ?ipratropium-albuterol 0.5-2.5 (3) MG/3ML Soln ?Commonly known as: DUONEB ?Take 3 mLs by nebulization every 4 (four) hours as needed. ?What changed: when to take this ?  ?nystatin cream ?Commonly known as: MYCOSTATIN ?Apply 1 application topically 2 (two) times daily as needed for dry skin (apply to perirectal rash). ?  ?OXYGEN ?Inhale 3 L into the lungs as directed. to maintain sats > or equal to 90%. May titrate ?Three Times A Day; 07:00 AM - 03:00 PM, 03:00 PM - 11:00 PM, 11:00 PM - 7:00 am ?  ?pantoprazole 40 MG tablet ?Commonly known as: PROTONIX ?Take 40 mg by mouth 2 (two) times daily. ?  ?Systane 0.4-0.3 % Soln ?Generic drug: Polyethyl Glycol-Propyl Glycol ?Place 2 drops into both eyes 4 (four) times daily as needed (irritation). ?  ?zinc oxide 20 % ointment ?Apply 1 application. topically 2 (two) times daily as needed for irritation (apply to perirectal area prn - use with nystatin). ?  ? ?  ? ?  Subjective: ?Today, patient was seen and examined at bedside.  Patient denies any chest pain, shortness of breath, fever, chills or rigor.  Denies lightheadedness or dizziness. ? ?Discharge Exam: ?Filed Weights  ? 07/06/21 1542 07/06/21 2000  ?Weight: 45.4 kg 48.1 kg  ? ? ?  07/09/2021  ?  8:41 AM 07/09/2021  ?  6:13 AM 07/08/2021  ?  9:09 PM  ?Vitals with BMI  ?Systolic [id number]  ?Diastolic 77 75 75  ?Pulse 49 50 49  ?  ?General: Thinly built, not in obvious distress, mild aphasia, on nasal cannula oxygen ?HENT:   No scleral pallor or icterus noted. Oral mucosa is moist.  ?Chest:  Clear breath sounds.  Diminished breath sounds bilaterally. No crackles or wheezes.  ?CVS: S1 &S2 heard. No murmur.  Regular rate and rhythm.  Bradycardic. ?Abdomen: Soft, nontender, nondistended.  Bowel sounds are heard.  PEG tube in  place. ?Extremities: No cyanosis, clubbing or edema.  Peripheral pulses are palpable. ?Psych: Alert, awake, aphasia, facial weakness, ?CNS: Wert, facial weakness noted, right sided hemiplegia, ?Skin: Warm and dry.  No r

## 2021-07-10 ENCOUNTER — Encounter: Payer: Self-pay | Admitting: Orthopedic Surgery

## 2021-07-10 ENCOUNTER — Non-Acute Institutional Stay (SKILLED_NURSING_FACILITY): Payer: Medicare Other | Admitting: Orthopedic Surgery

## 2021-07-10 DIAGNOSIS — J439 Emphysema, unspecified: Secondary | ICD-10-CM

## 2021-07-10 DIAGNOSIS — I421 Obstructive hypertrophic cardiomyopathy: Secondary | ICD-10-CM | POA: Diagnosis not present

## 2021-07-10 DIAGNOSIS — R001 Bradycardia, unspecified: Secondary | ICD-10-CM

## 2021-07-10 DIAGNOSIS — I480 Paroxysmal atrial fibrillation: Secondary | ICD-10-CM | POA: Diagnosis not present

## 2021-07-10 DIAGNOSIS — I10 Essential (primary) hypertension: Secondary | ICD-10-CM | POA: Diagnosis not present

## 2021-07-10 NOTE — Progress Notes (Signed)
?Location:  [facility] ?Nursing Home Room Number: 116/A ?Place of Service:  SNF (31) ?Provider: Yvonna Alanis, NP ? ?Patient Care Team: ?Virgie Dad, MD as PCP - General (Internal Medicine) ?Jerline Pain, MD as PCP - Cardiology (Cardiology) ?Deboraha Sprang, MD as PCP - Electrophysiology (Cardiology) ?Jovita Gamma, MD as Consulting Physician (Neurosurgery) ? ?Extended Emergency Contact Information ?Primary Emergency Contact: Berch,[name] ?Address: [place] ?         [place] ?Home Phone: [phone] ?Mobile Phone: [phone] ?Relation: Spouse ?Secondary Emergency Contact: Balthazor,[name] ?Address: [address] ?         [place], [place] [address] [place] ?Mobile Phone: [phone] ?Relation: Son ? ?Code Status:  Full Code ?Goals of care: Advanced Directive information ? ?  07/10/2021  ?  9:52 AM  ?Advanced Directives  ?Does Patient Have a Medical Advance Directive? Yes  ?Type of Advance Directive Living will  ?Does patient want to make changes to medical advance directive? No - Patient declined  ? ? ? ?Chief Complaint  ?Patient presents with  ? Acute Visit  ?  ED Follow-up   ? ? ?HPI:  ?Pt is a 76 y.o. female seen today for f/u s/p hospitalization 03/24-03/27.  ? ?She currently resides on the skilled nursing unit at [facility] 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  ? ?Prior to hospitalization, PT noted decreased HR in 30's. EKG in facility confirmed low reading. In ED, EKG sinus bradycardia with borderline prolonged QTC with HR 42. Troponin 197 (initial). TSH 0.6. Echo LVEF 60-65%. Asymptomatic during this time. Cardiology consulted- discussed concerns for tachybradycardia syndrome. Metoprolol discontinued. Advised to stay on amiodarone to help maintain sinus rhythm. BP was elevated during hospitalization. She was started on amlodipine.  Advised to wear Zio patch x 14 days. F/u with cardiology in 1 month.  ? ?Today, she is eating breakfast during encounter. Husband at bedside- concerned she is not talking much. Speech limited. She is able to say a few words. Denies chest pain, sob, blurred vision or light headedness. She is wearing Zio patch.  ? ? ? ? ?Past Medical History:  ?Diagnosis Date  ? Anxiety   ? Arthritis   ? "some in my lower back; probably elbows, knees" (11/18/2017)  ? Atrial fibrillation ([place])   ? Bell's palsy   ? when pt. was 76 yrs old, when under stress the left side of face will droop.  ? Complication of anesthesia   ? "vascular OR 2016; BP bottomed out; couldn't get it regulated; ended up in ICU for DAYS" (11/18/2017)  ? GERD (gastroesophageal reflux disease)   ? History of kidney stones   ? Hypertension   ? Hypertrophic cardiomyopathy ([place])   ? severe LV basilar hypertrophy witn no evidence of significant outflow tract obstruction, EF 65-70%, mild LAE, mild TR, grade 1a diastolic dysfunction [id number] (Dr. Candee Furbish) (Atrial Septal Hypertrophy pattern)-- Intra-op TEE with dsignificant outflow tract obstruction - AI, MR & TR  ? Insomnia   ? Mild aortic sclerosis   ? Osteopenia   ? Peripheral vascular disease ([place])   ? Syncope   ? , Vagal  ? ?Past Surgical History:  ?Procedure Laterality Date  ? AUGMENTATION MAMMAPLASTY Bilateral   ? BACK SURGERY    ? CARDIAC CATHETERIZATION N/A 05/07/2016  ? Procedure: Left Heart Cath and Coronary Angiography;  Surgeon: Annita Brod  Angelena Form, MD;  Location: [facility];  Service: Cardiovascular;  Laterality: N/A;  ? CARDIOVERSION N/A 09/24/2017  ? Procedure: CARDIOVERSION;  Surgeon: Larey Dresser, MD;  Location: [facility] ENDOSCOPY;  Service: Cardiovascular;  Laterality: N/A;  ? DILATION AND CURETTAGE OF UTERUS    ? ENDARTERECTOMY FEMORAL Right 03/02/2015  ? Procedure: ENDARTERECTOMY RIGHT FEMORAL;  Surgeon: Angelia Mould, MD;  Location: [facility];  Service: Vascular;  Laterality: Right;  ?  ESOPHAGOGASTRODUODENOSCOPY (EGD) WITH PROPOFOL N/A 12/01/2020  ? Procedure: ESOPHAGOGASTRODUODENOSCOPY (EGD) WITH PROPOFOL;  Surgeon: Jesusita Oka, MD;  Location: [facility]OSCOPY;  Service: General;  Laterality: N/A;  ? FACIAL COSMETIC SURGERY Left 2002  ? "related to [place] @ age 54; left eye/side of face droopy; tried to make area symmetrical"  ? FEMORAL-POPLITEAL BYPASS GRAFT Right 03/02/2015  ? Procedure: BYPASS GRAFT FEMORAL-BELOW KNEE POPLITEAL ARTERY;  Surgeon: Angelia Mould, MD;  Location: [facility];  Service: Vascular;  Laterality: Right;  ? INGUINAL HERNIA REPAIR Bilateral 2002  ? IR ANGIO INTRA EXTRACRAN SEL COM CAROTID INNOMINATE UNI L MOD SED  11/16/2020  ? IR CT HEAD LTD  11/13/2020  ? IR PERCUTANEOUS ART THROMBECTOMY/INFUSION INTRACRANIAL INC DIAG ANGIO  11/13/2020  ? OVARIAN CYST REMOVAL Left   ? PEG PLACEMENT N/A 12/01/2020  ? Procedure: PERCUTANEOUS ENDOSCOPIC GASTROSTOMY (PEG) PLACEMENT;  Surgeon: Jesusita Oka, MD;  Location: [facility];  Service: General;  Laterality: N/A;  ? PERIPHERAL VASCULAR CATHETERIZATION N/A 01/16/2015  ? Procedure: Abdominal Aortogram;  Surgeon: Angelia Mould, MD;  Location: [facility];  Service: Cardiovascular;  Laterality: N/A;  ? POSTERIOR LUMBAR FUSION  2015  ? "have plates and screws in there"  ? RADIOLOGY WITH ANESTHESIA N/A 11/13/2020  ? Procedure: IR WITH ANESTHESIA;  Surgeon: Radiologist, Medication, MD;  Location: [facility];  Service: Radiology;  Laterality: N/A;  ? TONSILLECTOMY    ? ? ?Allergies  ?Allergen Reactions  ? Amoxicillin Other (See Comments)  ?  UTI ?Has patient had a PCN reaction causing immediate rash, facial/tongue/throat swelling, SOB or lightheadedness with hypotension: No ?Has patient had a PCN reaction causing severe rash involving mucus membranes or skin necrosis: No ?Has patient had a PCN reaction that required hospitalization: No ?Has patient had a PCN reaction occurring within the last 10 years: Yes--UTI ONLY ?If all of  the above answers are "NO", then may proceed with Cephalosporin use. ?  ? Atenolol Cough  ? Crestor [Rosuvastatin Calcium] Other (See Comments)  ?  Muscle aches  ? Pravastatin Other (See Comments)  ?  Muscle aches  ? Sulfa Antibiotics Nausea Only  ? Codeine Other (See Comments)  ?  hallucinations  ? ? ?Outpatient Encounter Medications as of 07/10/2021  ?Medication Sig  ? aluminum-magnesium hydroxide-simethicone (MAALOX) [id number] MG/5ML SUSP Take 30 mLs by mouth every 4 (four) hours as needed (heartburn).  ? amiodarone (PACERONE) 200 MG tablet Take 200 mg by mouth daily. May give crushed  ? amLODipine (NORVASC) 2.5 MG tablet Take 2 tablets (5 mg total) by mouth daily.  ? apixaban (ELIQUIS) 5 MG TABS tablet Take 5 mg by mouth 2 (two) times daily. May give crushed  ? bisacodyl (DULCOLAX) 5 MG EC tablet Take 5 mg by mouth 3 (three) times daily as needed for moderate constipation.  ? budesonide (PULMICORT) 0.5 MG/2ML nebulizer solution Take 0.5 mg by nebulization 2 (two) times daily.  ? buPROPion ER (WELLBUTRIN SR) 100 MG 12 hr tablet Take 100 mg by mouth 2 (two) times  daily.  ? erythromycin ophthalmic ointment Place 1 application. into both eyes in the morning, at noon, in the evening, and at bedtime.  ? ezetimibe (ZETIA) 10 MG tablet Take 10 mg by mouth daily. Oral if crushed  ? furosemide (LASIX) 20 MG tablet Take 20 mg by mouth daily. Give Lasix 20mg  for weight gain equal or greater than 3 pounds  ? hydrALAZINE (APRESOLINE) 10 MG tablet Take 10 mg by mouth 3 (three) times daily as needed (SBP > 165 or DBP > 95).  ? ipratropium-albuterol (DUONEB) 0.5-2.5 (3) MG/3ML SOLN Take 3 mLs by nebulization every 4 (four) hours as needed.  ? nystatin cream (MYCOSTATIN) Apply 1 application topically 2 (two) times daily as needed for dry skin (apply to perirectal rash).  ? OXYGEN Inhale 3 L into the lungs as directed. to maintain sats > or equal to 90%. May titrate ?Three Times A Day; 07:00 AM - 03:00 PM, 03:00 PM - 11:00 PM,  11:00 PM - 7:00 am  ? pantoprazole (PROTONIX) 40 MG tablet Take 40 mg by mouth 2 (two) times daily.  ? Polyethyl Glycol-Propyl Glycol (SYSTANE) 0.4-0.3 % SOLN Place 2 drops into both eyes 4 (four) times daily

## 2021-07-11 DIAGNOSIS — E119 Type 2 diabetes mellitus without complications: Secondary | ICD-10-CM | POA: Diagnosis not present

## 2021-07-11 LAB — CBC AND DIFFERENTIAL
HCT: 42 (ref 36–46)
Hemoglobin: 14.1 (ref 12.0–16.0)
Platelets: 224 10*3/uL (ref 150–400)
WBC: 8.3

## 2021-07-11 LAB — BASIC METABOLIC PANEL
BUN: 24 — AB (ref 4–21)
CO2: 26 — AB (ref 13–22)
Chloride: 103 (ref 99–108)
Creatinine: 0.7 (ref 0.5–1.1)
Glucose: 89
Potassium: 4.1 mEq/L (ref 3.5–5.1)
Sodium: 141 (ref 137–147)

## 2021-07-11 LAB — COMPREHENSIVE METABOLIC PANEL: Calcium: 9.3 (ref 8.7–10.7)

## 2021-07-11 LAB — CBC: RBC: 4.784 (ref 3.87–5.11)

## 2021-07-12 ENCOUNTER — Non-Acute Institutional Stay (SKILLED_NURSING_FACILITY): Payer: Medicare Other | Admitting: Adult Health

## 2021-07-12 ENCOUNTER — Encounter: Payer: Self-pay | Admitting: Adult Health

## 2021-07-12 DIAGNOSIS — R220 Localized swelling, mass and lump, head: Secondary | ICD-10-CM | POA: Diagnosis not present

## 2021-07-12 DIAGNOSIS — J439 Emphysema, unspecified: Secondary | ICD-10-CM

## 2021-07-12 DIAGNOSIS — R0602 Shortness of breath: Secondary | ICD-10-CM

## 2021-07-12 DIAGNOSIS — L219 Seborrheic dermatitis, unspecified: Secondary | ICD-10-CM

## 2021-07-12 MED ORDER — KETOCONAZOLE 2 % EX GEL: 1.0000 "application " | Freq: Every day | CUTANEOUS | 0 refills | Status: AC

## 2021-07-12 MED ORDER — SPIRIVA HANDIHALER 18 MCG IN CAPS: 18.0000 ug | ORAL_CAPSULE | Freq: Every day | RESPIRATORY_TRACT | 12 refills | Status: DC

## 2021-07-12 NOTE — Addendum Note (Signed)
Addended by: Esmond Camper on: 07/12/2021 04:37 PM ? ? Modules accepted: Level of Service ? ?

## 2021-07-12 NOTE — Progress Notes (Addendum)
?Location:  [facility] ?Nursing Home Room Number: 116/A ?Place of Service:  SNF (31) ?Provider: .Royal Hawthorn, NP ?Patient Care Team: ?Virgie Dad, MD as PCP - General (Internal Medicine) ?Jerline Pain, MD as PCP - Cardiology (Cardiology) ?Deboraha Sprang, MD as PCP - Electrophysiology (Cardiology) ?Jovita Gamma, MD as Consulting Physician (Neurosurgery) ? ?Extended Emergency Contact Information ?Primary Emergency Contact: Brisbane,[name] ?Address: [address] ?         [place] ?Home Phone: [phone] ?Mobile Phone: [phone] ?Relation: Spouse ?Secondary Emergency Contact: Hyneman,[name] ?Address: [address] ?         [place], [place] [address] [place] ?Mobile Phone: [phone] ?Relation: Son ? ?Code Status:  Full Code ?Goals of care: Advanced Directive information ? ?  07/12/2021  ? 12:31 PM  ?Advanced Directives  ?Does Patient Have a Medical Advance Directive? Yes  ?Type of Advance Directive Living will  ?Does patient want to make changes to medical advance directive? No - Patient declined  ? ? ? ?Chief Complaint  ?Patient presents with  ? Acute Visit  ?  Shortness of breath after nebulizer  ? ? ?HPI:  ?Pt is a 76 y.o. female seen today for an acute visit for shortness of breath after a nebulizer.  ?She reports that she had a pulmicort neb on 3/29 and felt short of breath afterward. This happened in the hospital as well. She was in the hospital with bradycardia. She is wearing a zio patch to further eval for tachy brady syndrome and need for patch. Pulse 48-55 here at [facility]. Husband reports in the 65s when he checked. Off metoprolol, still on amiodarone for rate control due to afib.  ? ?COPD: Former smoker, severe obstruction per pulmonary. Recommended stiolto in 2020. Now on pulmicort and duoneb prn. She was on brovana but this was stopped at some point for reasons that are not clear. Last exacerbation  Oct 22 which was treated with prednisone. Prior to that had aspiration pna. Now on oxygen 2 liters continuously. Sats above 90.   ? ?Also her husband reports that she has some mild swelling to the right side of her face. No pain or difficulty chewing. No ear pain.  ? ?Also has rash to her forehead not itching.  ?Past Medical History:  ?Diagnosis Date  ? Anxiety   ? Arthritis   ? "some in my lower back; probably elbows, knees" (11/18/2017)  ? Atrial fibrillation ([place])   ? Bell's palsy   ? when pt. was 76 yrs old, when under stress the left side of face will droop.  ? Complication of anesthesia   ? "vascular OR 2016; BP bottomed out; couldn't get it regulated; ended up in ICU for DAYS" (11/18/2017)  ? GERD (gastroesophageal reflux disease)   ? History of kidney stones   ? Hypertension   ? Hypertrophic cardiomyopathy ([place])   ? severe LV basilar hypertrophy witn no evidence of significant outflow tract obstruction, EF 65-70%, mild LAE, mild TR, grade 1a diastolic dysfunction [id number] (Dr. Candee Furbish) (Atrial Septal Hypertrophy pattern)-- Intra-op TEE with dsignificant outflow tract obstruction - AI, MR & TR  ? Insomnia   ? Mild aortic sclerosis   ? Osteopenia   ? Peripheral vascular disease ([place])   ? Syncope   ? , Vagal  ? ?Past Surgical History:  ?Procedure Laterality Date  ? AUGMENTATION MAMMAPLASTY Bilateral   ? BACK SURGERY    ? CARDIAC CATHETERIZATION N/A  05/07/2016  ? Procedure: Left Heart Cath and Coronary Angiography;  Surgeon: Burnell Blanks, MD;  Location: [facility] CV LAB;  Service: Cardiovascular;  Laterality: N/A;  ? CARDIOVERSION N/A 09/24/2017  ? Procedure: CARDIOVERSION;  Surgeon: Larey Dresser, MD;  Location: [facility] ENDOSCOPY;  Service: Cardiovascular;  Laterality: N/A;  ? DILATION AND CURETTAGE OF UTERUS    ? ENDARTERECTOMY FEMORAL Right 03/02/2015  ? Procedure: ENDARTERECTOMY RIGHT FEMORAL;  Surgeon: Angelia Mould, MD;  Location: [facility];  Service: Vascular;  Laterality: Right;  ?  ESOPHAGOGASTRODUODENOSCOPY (EGD) WITH PROPOFOL N/A 12/01/2020  ? Procedure: ESOPHAGOGASTRODUODENOSCOPY (EGD) WITH PROPOFOL;  Surgeon: Jesusita Oka, MD;  Location: [facility] ENDOSCOPY;  Service: General;  Laterality: N/A;  ? FACIAL COSMETIC SURGERY Left 2002  ? "related to [place] @ age 85; left eye/side of face droopy; tried to make area symmetrical"  ? FEMORAL-POPLITEAL BYPASS GRAFT Right 03/02/2015  ? Procedure: BYPASS GRAFT FEMORAL-BELOW KNEE POPLITEAL ARTERY;  Surgeon: Angelia Mould, MD;  Location: [facility];  Service: Vascular;  Laterality: Right;  ? INGUINAL HERNIA REPAIR Bilateral 2002  ? IR ANGIO INTRA EXTRACRAN SEL COM CAROTID INNOMINATE UNI L MOD SED  11/16/2020  ? IR CT HEAD LTD  11/13/2020  ? IR PERCUTANEOUS ART THROMBECTOMY/INFUSION INTRACRANIAL INC DIAG ANGIO  11/13/2020  ? OVARIAN CYST REMOVAL Left   ? PEG PLACEMENT N/A 12/01/2020  ? Procedure: PERCUTANEOUS ENDOSCOPIC GASTROSTOMY (PEG) PLACEMENT;  Surgeon: Jesusita Oka, MD;  Location: [facility] ENDOSCOPY;  Service: General;  Laterality: N/A;  ? PERIPHERAL VASCULAR CATHETERIZATION N/A 01/16/2015  ? Procedure: Abdominal Aortogram;  Surgeon: Angelia Mould, MD;  Location: [facility] CV LAB;  Service: Cardiovascular;  Laterality: N/A;  ? POSTERIOR LUMBAR FUSION  2015  ? "have plates and screws in there"  ? RADIOLOGY WITH ANESTHESIA N/A 11/13/2020  ? Procedure: IR WITH ANESTHESIA;  Surgeon: Radiologist, Medication, MD;  Location: [facility];  Service: Radiology;  Laterality: N/A;  ? TONSILLECTOMY    ? ? ?Allergies  ?Allergen Reactions  ? Amoxicillin Other (See Comments)  ?  UTI ?Has patient had a PCN reaction causing immediate rash, facial/tongue/throat swelling, SOB or lightheadedness with hypotension: No ?Has patient had a PCN reaction causing severe rash involving mucus membranes or skin necrosis: No ?Has patient had a PCN reaction that required hospitalization: No ?Has patient had a PCN reaction occurring within the last 10 years: Yes--UTI ONLY ?If all of  the above answers are "NO", then may proceed with Cephalosporin use. ?  ? Atenolol Cough  ? Crestor [Rosuvastatin Calcium] Other (See Comments)  ?  Muscle aches  ? Pravastatin Other (See Comments)  ?  Muscle aches  ? Sulfa Antibiotics Nausea Only  ? Codeine Other (See Comments)  ?  hallucinations  ? ? ?Outpatient Encounter Medications as of 07/12/2021  ?Medication Sig  ? aluminum-magnesium hydroxide-simethicone (MAALOX) [id number] MG/5ML SUSP Take 30 mLs by mouth every 4 (four) hours as needed (heartburn).  ? amiodarone (PACERONE) 200 MG tablet Take 200 mg by mouth daily. May give crushed  ? amLODipine (NORVASC) 2.5 MG tablet Take 2 tablets (5 mg total) by mouth daily.  ? apixaban (ELIQUIS) 5 MG TABS tablet Take 5 mg by mouth 2 (two) times daily. May give crushed  ? bisacodyl (DULCOLAX) 5 MG EC tablet Take 5 mg by mouth 3 (three) times daily as needed for moderate constipation.  ? budesonide (PULMICORT) 0.5 MG/2ML nebulizer solution Take 0.5 mg by nebulization 2 (two) times daily.  ? buPROPion ER [facility]  SR) 100 MG 12 hr tablet Take 100 mg by mouth 2 (two) times daily.  ? erythromycin ophthalmic ointment Place 1 application. into both eyes in the morning, at noon, in the evening, and at bedtime.  ? ezetimibe (ZETIA) 10 MG tablet Take 10 mg by mouth daily. Oral if crushed  ? furosemide (LASIX) 20 MG tablet Take 20 mg by mouth daily. Give Lasix 20mg  for weight gain equal or greater than 3 pounds  ? hydrALAZINE (APRESOLINE) 10 MG tablet Take 10 mg by mouth 3 (three) times daily as needed (SBP > 165 or DBP > 95).  ? ipratropium-albuterol (DUONEB) 0.5-2.5 (3) MG/3ML SOLN Take 3 mLs by nebulization every 4 (four) hours as needed.  ? nystatin cream (MYCOSTATIN) Apply 1 application topically 2 (two) times daily as needed for dry skin (apply to perirectal rash).  ? OXYGEN Inhale 3 L into the lungs as directed. to maintain sats > or equal to 90%. May titrate ?Three Times A Day; 07:00 AM - 03:00 PM, 03:00 PM - 11:00 PM,  11:00 PM - 7:00 am  ? pantoprazole (PROTONIX) 40 MG tablet Take 40 mg by mouth 2 (two) times daily.  ? Polyethyl Glycol-Propyl Glycol (SYSTANE) 0.4-0.3 % SOLN Place 2 drops into both eyes 4 (four) times daily as nee

## 2021-07-13 ENCOUNTER — Telehealth: Payer: Self-pay | Admitting: Cardiology

## 2021-07-13 NOTE — Telephone Encounter (Signed)
Will route this message to pts Gen Cards Dr. Anne Fu, and EP Provider Dr. Graciela Husbands, as a general FYI.  ?

## 2021-07-13 NOTE — Telephone Encounter (Signed)
Sam - [facility] calling to give critical ekg report ?

## 2021-07-13 NOTE — Telephone Encounter (Signed)
Monitor showed to Dr. Caryl Comes and signed.  No changes to be made, pt should proceed with monitoring process and continue current med regimen/treatment plan. ? ?Will go ahead and place signed monitor in med rec box to be scanned.  Will endorse to the pt when she returns a call back. ?

## 2021-07-13 NOTE — Telephone Encounter (Signed)
? ?  Cardiac Monitor Alert ? ?Date of alert:  07/13/2021  ? ?Patient Name: Beverly Li  ?DOB: Nov 14, 1945  ?MRN: [id number]  ? ?[facility] Cardiologist: Donato Schultz, MD  ?[facility] EP:  Sherryl Manges, MD   ? ?Monitor Information: ?Long Term Monitor-Live Telemetry [ZioAT]  ?Reason:  bradycardia ?Ordering provider:  Nada Boozer NP-post hospital monitor ordered for bradycardia ?  ?Alert ?Atrial Fibrillation/Flutter ?This is the 1st alert for this rhythm.  ?The patient has a hx of Atrial Fibrillation/Flutter.  ?  ?Anticoagulation medication as of 07/13/2021   ? ?    ?  ? apixaban (ELIQUIS) 5 MG TABS tablet Take 5 mg by mouth 2 (two) times daily. May give crushed  ? ?  ? ? ?Next Cardiology Appointment   ?Date:  4/19 with Chelsea Aus PA-C and 4/21 with EP MD Dr. Graciela Husbands    ? ?The patient could NOT be reached by telephone today.  LMTCB ? ? ?Other: ? ?[facility] called to report monitor finding on this pt showing 60 secs of aflutter on this pt ranging from 68-105 bpm.  Left the pt a message to call back to follow-up if symptomatic or not during recorded time.  Monitor ordered by Nada Boozer NP post-hospital for bradycardia.  Pt does have a history of afib/flutter.  She is on amiodarone and eliquis 5 mg po bid.  Attempted to call the pt back x3 and no answer.  Did leave her a message to call the office back before 5 pm and ask to speak with a triage nurse, or on-call Provider after 5 pm.  Will go and show the DOD and pts primary EP Provider Dr. Graciela Husbands, pts monitor recording for interpretation and advisement.   Awaiting for [facility] to fax report. ? Loa Socks, LPN  ?2/94/7654 3:53 PM  ? ?

## 2021-07-16 ENCOUNTER — Encounter: Payer: Self-pay | Admitting: Internal Medicine

## 2021-07-16 ENCOUNTER — Non-Acute Institutional Stay (SKILLED_NURSING_FACILITY): Payer: Medicare Other | Admitting: Internal Medicine

## 2021-07-16 DIAGNOSIS — R278 Other lack of coordination: Secondary | ICD-10-CM | POA: Diagnosis not present

## 2021-07-16 DIAGNOSIS — I69391 Dysphagia following cerebral infarction: Secondary | ICD-10-CM

## 2021-07-16 DIAGNOSIS — I5032 Chronic diastolic (congestive) heart failure: Secondary | ICD-10-CM | POA: Diagnosis not present

## 2021-07-16 DIAGNOSIS — E782 Mixed hyperlipidemia: Secondary | ICD-10-CM | POA: Diagnosis not present

## 2021-07-16 DIAGNOSIS — R2689 Other abnormalities of gait and mobility: Secondary | ICD-10-CM | POA: Diagnosis not present

## 2021-07-16 DIAGNOSIS — R471 Dysarthria and anarthria: Secondary | ICD-10-CM | POA: Diagnosis not present

## 2021-07-16 DIAGNOSIS — I69359 Hemiplegia and hemiparesis following cerebral infarction affecting unspecified side: Secondary | ICD-10-CM

## 2021-07-16 DIAGNOSIS — J9601 Acute respiratory failure with hypoxia: Secondary | ICD-10-CM | POA: Diagnosis not present

## 2021-07-16 DIAGNOSIS — I480 Paroxysmal atrial fibrillation: Secondary | ICD-10-CM

## 2021-07-16 DIAGNOSIS — J439 Emphysema, unspecified: Secondary | ICD-10-CM | POA: Diagnosis not present

## 2021-07-16 DIAGNOSIS — R001 Bradycardia, unspecified: Secondary | ICD-10-CM | POA: Diagnosis not present

## 2021-07-16 DIAGNOSIS — R0602 Shortness of breath: Secondary | ICD-10-CM

## 2021-07-16 DIAGNOSIS — I63312 Cerebral infarction due to thrombosis of left middle cerebral artery: Secondary | ICD-10-CM | POA: Diagnosis not present

## 2021-07-16 DIAGNOSIS — I6602 Occlusion and stenosis of left middle cerebral artery: Secondary | ICD-10-CM | POA: Diagnosis not present

## 2021-07-16 DIAGNOSIS — F339 Major depressive disorder, recurrent, unspecified: Secondary | ICD-10-CM

## 2021-07-16 DIAGNOSIS — I69351 Hemiplegia and hemiparesis following cerebral infarction affecting right dominant side: Secondary | ICD-10-CM | POA: Diagnosis not present

## 2021-07-16 DIAGNOSIS — M62561 Muscle wasting and atrophy, not elsewhere classified, right lower leg: Secondary | ICD-10-CM | POA: Diagnosis not present

## 2021-07-16 DIAGNOSIS — R293 Abnormal posture: Secondary | ICD-10-CM | POA: Diagnosis not present

## 2021-07-16 DIAGNOSIS — R482 Apraxia: Secondary | ICD-10-CM | POA: Diagnosis not present

## 2021-07-16 DIAGNOSIS — I10 Essential (primary) hypertension: Secondary | ICD-10-CM

## 2021-07-16 DIAGNOSIS — I421 Obstructive hypertrophic cardiomyopathy: Secondary | ICD-10-CM

## 2021-07-16 DIAGNOSIS — M6389 Disorders of muscle in diseases classified elsewhere, multiple sites: Secondary | ICD-10-CM | POA: Diagnosis not present

## 2021-07-16 NOTE — Progress Notes (Signed)
?Provider:  Veleta Miners MD ?Location:   [facility] ?Nursing Home Room Number: 116 ?Place of Service:  SNF (31) ? ?PCP: Virgie Dad, MD ?Patient Care Team: ?Virgie Dad, MD as PCP - General (Internal Medicine) ?Jerline Pain, MD as PCP - Cardiology (Cardiology) ?Deboraha Sprang, MD as PCP - Electrophysiology (Cardiology) ?Jovita Gamma, MD as Consulting Physician (Neurosurgery) ? ?Extended Emergency Contact Information ?Primary Emergency Contact: Ackerman,[name] ?Address: [address] ?         [place] ?Home Phone: [phone] ?Mobile Phone: [phone] ?Relation: Spouse ?Secondary Emergency Contact: Heckart,[name] ?Address: [address] ?         [place], [place] [address] [place] ?Mobile Phone: [phone] ?Relation: Son ? ?Code Status: Full Code ?Goals of Care: Advanced Directive information ? ?  07/16/2021  ?  9:27 AM  ?Advanced Directives  ?Does Patient Have a Medical Advance Directive? Yes  ?Type of Advance Directive Living will  ?Does patient want to make changes to medical advance directive? No - Patient declined  ? ? ? ? ?Chief Complaint  ?Patient presents with  ? Acute Visit  ?  Post hospital visit bradycardia  ? ? ?HPI: Patient is a 76 y.o. female seen today for Readmit  ? ?She is LT resident of [facility]  ? ?Admitted from 03/24 -03/27 for Bradycardia most likely due to Metoprolol ?Patient with h/o A Fib and HLD HOCM, PAD, anxiety and GERD ? ? Was Admitted to the Hospital from 10/10-10/11 for Increased Diffusion in Left Basal Ganglia internal and external capsules extending Caudate body ?  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 8/30-9/8 for Sepsis due to Pneumonia ?She received In patient Rehab from 9/8 -10/03 ?  ?Was send to ED for Asymptomatic Loletha Grayer cardia ?No Dizziness or Chest Pain. Was working with Therapy and was noticed to have low HR ?Hospital negative work up ?Echo  normal has HOCM ?Taken off Metoprolol continued on Amiodarone. ?Also has Zio patch for monitoring ? ?Was started on Inhalers per christy off pulmicort ?Now on Spiriva and doing well ?No Complains today ?Walked with therapy  ? ? ? ?Past Medical History:  ?Diagnosis Date  ? Anxiety   ? Arthritis   ? "some in my lower back; probably elbows, knees" (11/18/2017)  ? Atrial fibrillation ([place])   ? Bell's palsy   ? when pt. was 76 yrs old, when under stress the left side of face will droop.  ? Complication of anesthesia   ? "vascular OR 2016; BP bottomed out; couldn't get it regulated; ended up in ICU for DAYS" (11/18/2017)  ? GERD (gastroesophageal reflux disease)   ? History of kidney stones   ? Hypertension   ? Hypertrophic cardiomyopathy ([place])   ? severe LV basilar hypertrophy witn no evidence of significant outflow tract obstruction, EF 65-70%, mild LAE, mild TR, grade 1a diastolic dysfunction [id number] (Dr. Candee Furbish) (Atrial Septal Hypertrophy pattern)-- Intra-op TEE with dsignificant outflow tract obstruction - AI, MR & TR  ? Insomnia   ? Mild aortic sclerosis   ? Osteopenia   ? Peripheral vascular disease ([place])   ? Syncope   ? , Vagal  ? ?Past Surgical History:  ?Procedure Laterality Date  ? AUGMENTATION MAMMAPLASTY Bilateral   ? BACK SURGERY    ? CARDIAC CATHETERIZATION N/A 05/07/2016  ? Procedure: Left Heart Cath and Coronary Angiography;  Surgeon: Burnell Blanks, MD;  Location: [facility]  INVASIVE CV LAB;  Service: Cardiovascular;  Laterality: N/A;  ? CARDIOVERSION N/A 09/24/2017  ? Procedure: CARDIOVERSION;  Surgeon: Larey Dresser, MD;  Location: [facility] ENDOSCOPY;  Service: Cardiovascular;  Laterality: N/A;  ? DILATION AND CURETTAGE OF UTERUS    ? ENDARTERECTOMY FEMORAL Right 03/02/2015  ? Procedure: ENDARTERECTOMY RIGHT FEMORAL;  Surgeon: Angelia Mould, MD;  Location: [facility];  Service: Vascular;  Laterality: Right;  ? ESOPHAGOGASTRODUODENOSCOPY (EGD) WITH PROPOFOL N/A 12/01/2020  ? Procedure:  ESOPHAGOGASTRODUODENOSCOPY (EGD) WITH PROPOFOL;  Surgeon: Jesusita Oka, MD;  Location: [facility] ENDOSCOPY;  Service: General;  Laterality: N/A;  ? FACIAL COSMETIC SURGERY Left 2002  ? "related to [place] @ age 28; left eye/side of face droopy; tried to make area symmetrical"  ? FEMORAL-POPLITEAL BYPASS GRAFT Right 03/02/2015  ? Procedure: BYPASS GRAFT FEMORAL-BELOW KNEE POPLITEAL ARTERY;  Surgeon: Angelia Mould, MD;  Location: [facility];  Service: Vascular;  Laterality: Right;  ? INGUINAL HERNIA REPAIR Bilateral 2002  ? IR ANGIO INTRA EXTRACRAN SEL COM CAROTID INNOMINATE UNI L MOD SED  11/16/2020  ? IR CT HEAD LTD  11/13/2020  ? IR PERCUTANEOUS ART THROMBECTOMY/INFUSION INTRACRANIAL INC DIAG ANGIO  11/13/2020  ? OVARIAN CYST REMOVAL Left   ? PEG PLACEMENT N/A 12/01/2020  ? Procedure: PERCUTANEOUS ENDOSCOPIC GASTROSTOMY (PEG) PLACEMENT;  Surgeon: Jesusita Oka, MD;  Location: [facility] ENDOSCOPY;  Service: General;  Laterality: N/A;  ? PERIPHERAL VASCULAR CATHETERIZATION N/A 01/16/2015  ? Procedure: Abdominal Aortogram;  Surgeon: Angelia Mould, MD;  Location: [facility] CV LAB;  Service: Cardiovascular;  Laterality: N/A;  ? POSTERIOR LUMBAR FUSION  2015  ? "have plates and screws in there"  ? RADIOLOGY WITH ANESTHESIA N/A 11/13/2020  ? Procedure: IR WITH ANESTHESIA;  Surgeon: Radiologist, Medication, MD;  Location: [facility];  Service: Radiology;  Laterality: N/A;  ? TONSILLECTOMY    ? ? reports that she quit smoking about 6 years ago. Her smoking use included cigarettes. She has a 50.00 pack-year smoking history. She has never used smokeless tobacco. She reports current alcohol use. No history on file for drug use. ?Social History  ? ?Socioeconomic History  ? Marital status: Married  ?  Spouse name: Not on file  ? Number of children: Not on file  ? Years of education: Not on file  ? Highest education level: Not on file  ?Occupational History  ? Not on file  ?Tobacco Use  ? Smoking status: Former  ?  Packs/day: 1.00  ?   Years: 50.00  ?  Pack years: 50.00  ?  Types: Cigarettes  ?  Quit date: 12/15/2014  ?  Years since quitting: 6.5  ? Smokeless tobacco: Never  ?Vaping Use  ? Vaping Use: Never used  ?Substance and Sexual Activity  ? Alcohol use: Yes  ?  Comment: 11/18/2017 "might have a couple glasses of wine/month; if that"  ? Drug use: Not on file  ? Sexual activity: Not Currently  ?Other Topics Concern  ? Not on file  ?Social History Narrative  ? Not on file  ? ?Social Determinants of Health  ? ?Financial Resource Strain: Not on file  ?Food Insecurity: Not on file  ?Transportation Needs: Not on file  ?Physical Activity: Not on file  ?Stress: Not on file  ?Social Connections: Not on file  ?Intimate Partner Violence: Not on file  ? ? ?Functional Status Survey: ?  ? ?Family History  ?Problem Relation Age of Onset  ? Liver cancer Mother   ? Cancer  Mother   ?     Liver  ? Hypertension Mother   ? Lung cancer Father   ? Cancer Father   ?     Lung  ? Breast cancer Sister   ? Cancer Sister   ?     Breast  ? ? ?Health Maintenance  ?Topic Date Due  ? Zoster Vaccines- Shingrix (1 of 2) Never done  ? Hepatitis C Screening  04/15/2022 (Originally 04/25/1963)  ? INFLUENZA VACCINE  11/13/2021  ? TETANUS/TDAP  05/20/2023  ? Pneumonia Vaccine 53+ Years old  Completed  ? COVID-19 Vaccine  Completed  ? HPV VACCINES  Aged Out  ? DEXA SCAN  Discontinued  ? ? ?Allergies  ?Allergen Reactions  ? Amoxicillin Other (See Comments)  ?  UTI ?Has patient had a PCN reaction causing immediate rash, facial/tongue/throat swelling, SOB or lightheadedness with hypotension: No ?Has patient had a PCN reaction causing severe rash involving mucus membranes or skin necrosis: No ?Has patient had a PCN reaction that required hospitalization: No ?Has patient had a PCN reaction occurring within the last 10 years: Yes--UTI ONLY ?If all of the above answers are "NO", then may proceed with Cephalosporin use. ?  ? Atenolol Cough  ? Crestor [Rosuvastatin Calcium] Other (See Comments)   ?  Muscle aches  ? Pravastatin Other (See Comments)  ?  Muscle aches  ? Sulfa Antibiotics Nausea Only  ? Codeine Other (See Comments)  ?  hallucinations  ? ? ?Allergies as of 07/16/2021   ? ?   Reactions  ? Amoxicillin Other

## 2021-07-17 DIAGNOSIS — R001 Bradycardia, unspecified: Secondary | ICD-10-CM | POA: Diagnosis not present

## 2021-07-17 DIAGNOSIS — M6389 Disorders of muscle in diseases classified elsewhere, multiple sites: Secondary | ICD-10-CM | POA: Diagnosis not present

## 2021-07-17 DIAGNOSIS — I480 Paroxysmal atrial fibrillation: Secondary | ICD-10-CM | POA: Diagnosis not present

## 2021-07-17 DIAGNOSIS — R482 Apraxia: Secondary | ICD-10-CM | POA: Diagnosis not present

## 2021-07-17 DIAGNOSIS — R278 Other lack of coordination: Secondary | ICD-10-CM | POA: Diagnosis not present

## 2021-07-17 DIAGNOSIS — I63312 Cerebral infarction due to thrombosis of left middle cerebral artery: Secondary | ICD-10-CM | POA: Diagnosis not present

## 2021-07-17 DIAGNOSIS — R471 Dysarthria and anarthria: Secondary | ICD-10-CM | POA: Diagnosis not present

## 2021-07-17 DIAGNOSIS — R293 Abnormal posture: Secondary | ICD-10-CM | POA: Diagnosis not present

## 2021-07-17 DIAGNOSIS — I69351 Hemiplegia and hemiparesis following cerebral infarction affecting right dominant side: Secondary | ICD-10-CM | POA: Diagnosis not present

## 2021-07-17 DIAGNOSIS — I6602 Occlusion and stenosis of left middle cerebral artery: Secondary | ICD-10-CM | POA: Diagnosis not present

## 2021-07-18 DIAGNOSIS — R471 Dysarthria and anarthria: Secondary | ICD-10-CM | POA: Diagnosis not present

## 2021-07-18 DIAGNOSIS — I6602 Occlusion and stenosis of left middle cerebral artery: Secondary | ICD-10-CM | POA: Diagnosis not present

## 2021-07-18 DIAGNOSIS — J9601 Acute respiratory failure with hypoxia: Secondary | ICD-10-CM | POA: Diagnosis not present

## 2021-07-18 DIAGNOSIS — M6389 Disorders of muscle in diseases classified elsewhere, multiple sites: Secondary | ICD-10-CM | POA: Diagnosis not present

## 2021-07-18 DIAGNOSIS — I69351 Hemiplegia and hemiparesis following cerebral infarction affecting right dominant side: Secondary | ICD-10-CM | POA: Diagnosis not present

## 2021-07-18 DIAGNOSIS — R482 Apraxia: Secondary | ICD-10-CM | POA: Diagnosis not present

## 2021-07-18 DIAGNOSIS — R278 Other lack of coordination: Secondary | ICD-10-CM | POA: Diagnosis not present

## 2021-07-18 DIAGNOSIS — I5032 Chronic diastolic (congestive) heart failure: Secondary | ICD-10-CM | POA: Diagnosis not present

## 2021-07-18 DIAGNOSIS — I63312 Cerebral infarction due to thrombosis of left middle cerebral artery: Secondary | ICD-10-CM | POA: Diagnosis not present

## 2021-07-18 DIAGNOSIS — I480 Paroxysmal atrial fibrillation: Secondary | ICD-10-CM | POA: Diagnosis not present

## 2021-07-18 DIAGNOSIS — R001 Bradycardia, unspecified: Secondary | ICD-10-CM | POA: Diagnosis not present

## 2021-07-18 DIAGNOSIS — R2689 Other abnormalities of gait and mobility: Secondary | ICD-10-CM | POA: Diagnosis not present

## 2021-07-19 DIAGNOSIS — R293 Abnormal posture: Secondary | ICD-10-CM | POA: Diagnosis not present

## 2021-07-19 DIAGNOSIS — I480 Paroxysmal atrial fibrillation: Secondary | ICD-10-CM | POA: Diagnosis not present

## 2021-07-19 DIAGNOSIS — I6602 Occlusion and stenosis of left middle cerebral artery: Secondary | ICD-10-CM | POA: Diagnosis not present

## 2021-07-19 DIAGNOSIS — I69351 Hemiplegia and hemiparesis following cerebral infarction affecting right dominant side: Secondary | ICD-10-CM | POA: Diagnosis not present

## 2021-07-19 DIAGNOSIS — R471 Dysarthria and anarthria: Secondary | ICD-10-CM | POA: Diagnosis not present

## 2021-07-19 DIAGNOSIS — M6389 Disorders of muscle in diseases classified elsewhere, multiple sites: Secondary | ICD-10-CM | POA: Diagnosis not present

## 2021-07-19 DIAGNOSIS — R001 Bradycardia, unspecified: Secondary | ICD-10-CM | POA: Diagnosis not present

## 2021-07-19 DIAGNOSIS — R278 Other lack of coordination: Secondary | ICD-10-CM | POA: Diagnosis not present

## 2021-07-19 DIAGNOSIS — I63312 Cerebral infarction due to thrombosis of left middle cerebral artery: Secondary | ICD-10-CM | POA: Diagnosis not present

## 2021-07-19 DIAGNOSIS — R482 Apraxia: Secondary | ICD-10-CM | POA: Diagnosis not present

## 2021-07-20 DIAGNOSIS — R2689 Other abnormalities of gait and mobility: Secondary | ICD-10-CM | POA: Diagnosis not present

## 2021-07-20 DIAGNOSIS — J9601 Acute respiratory failure with hypoxia: Secondary | ICD-10-CM | POA: Diagnosis not present

## 2021-07-20 DIAGNOSIS — I69351 Hemiplegia and hemiparesis following cerebral infarction affecting right dominant side: Secondary | ICD-10-CM | POA: Diagnosis not present

## 2021-07-20 DIAGNOSIS — I63312 Cerebral infarction due to thrombosis of left middle cerebral artery: Secondary | ICD-10-CM | POA: Diagnosis not present

## 2021-07-20 DIAGNOSIS — I5032 Chronic diastolic (congestive) heart failure: Secondary | ICD-10-CM | POA: Diagnosis not present

## 2021-07-23 DIAGNOSIS — M62561 Muscle wasting and atrophy, not elsewhere classified, right lower leg: Secondary | ICD-10-CM | POA: Diagnosis not present

## 2021-07-23 DIAGNOSIS — I5032 Chronic diastolic (congestive) heart failure: Secondary | ICD-10-CM | POA: Diagnosis not present

## 2021-07-23 DIAGNOSIS — J9601 Acute respiratory failure with hypoxia: Secondary | ICD-10-CM | POA: Diagnosis not present

## 2021-07-23 DIAGNOSIS — I69351 Hemiplegia and hemiparesis following cerebral infarction affecting right dominant side: Secondary | ICD-10-CM | POA: Diagnosis not present

## 2021-07-23 DIAGNOSIS — R278 Other lack of coordination: Secondary | ICD-10-CM | POA: Diagnosis not present

## 2021-07-23 DIAGNOSIS — I63312 Cerebral infarction due to thrombosis of left middle cerebral artery: Secondary | ICD-10-CM | POA: Diagnosis not present

## 2021-07-23 DIAGNOSIS — R2689 Other abnormalities of gait and mobility: Secondary | ICD-10-CM | POA: Diagnosis not present

## 2021-07-25 DIAGNOSIS — I63312 Cerebral infarction due to thrombosis of left middle cerebral artery: Secondary | ICD-10-CM | POA: Diagnosis not present

## 2021-07-25 DIAGNOSIS — R2689 Other abnormalities of gait and mobility: Secondary | ICD-10-CM | POA: Diagnosis not present

## 2021-07-25 DIAGNOSIS — J9601 Acute respiratory failure with hypoxia: Secondary | ICD-10-CM | POA: Diagnosis not present

## 2021-07-25 DIAGNOSIS — I69351 Hemiplegia and hemiparesis following cerebral infarction affecting right dominant side: Secondary | ICD-10-CM | POA: Diagnosis not present

## 2021-07-25 DIAGNOSIS — I5032 Chronic diastolic (congestive) heart failure: Secondary | ICD-10-CM | POA: Diagnosis not present

## 2021-07-27 DIAGNOSIS — R278 Other lack of coordination: Secondary | ICD-10-CM | POA: Diagnosis not present

## 2021-07-27 DIAGNOSIS — I63312 Cerebral infarction due to thrombosis of left middle cerebral artery: Secondary | ICD-10-CM | POA: Diagnosis not present

## 2021-07-27 DIAGNOSIS — I5032 Chronic diastolic (congestive) heart failure: Secondary | ICD-10-CM | POA: Diagnosis not present

## 2021-07-27 DIAGNOSIS — R001 Bradycardia, unspecified: Secondary | ICD-10-CM | POA: Diagnosis not present

## 2021-07-27 DIAGNOSIS — I69351 Hemiplegia and hemiparesis following cerebral infarction affecting right dominant side: Secondary | ICD-10-CM | POA: Diagnosis not present

## 2021-07-27 DIAGNOSIS — J9601 Acute respiratory failure with hypoxia: Secondary | ICD-10-CM | POA: Diagnosis not present

## 2021-07-27 DIAGNOSIS — R293 Abnormal posture: Secondary | ICD-10-CM | POA: Diagnosis not present

## 2021-07-27 DIAGNOSIS — R2689 Other abnormalities of gait and mobility: Secondary | ICD-10-CM | POA: Diagnosis not present

## 2021-07-27 DIAGNOSIS — M6389 Disorders of muscle in diseases classified elsewhere, multiple sites: Secondary | ICD-10-CM | POA: Diagnosis not present

## 2021-07-30 DIAGNOSIS — R482 Apraxia: Secondary | ICD-10-CM | POA: Diagnosis not present

## 2021-07-30 DIAGNOSIS — R278 Other lack of coordination: Secondary | ICD-10-CM | POA: Diagnosis not present

## 2021-07-30 DIAGNOSIS — I6602 Occlusion and stenosis of left middle cerebral artery: Secondary | ICD-10-CM | POA: Diagnosis not present

## 2021-07-30 DIAGNOSIS — R001 Bradycardia, unspecified: Secondary | ICD-10-CM | POA: Diagnosis not present

## 2021-07-30 DIAGNOSIS — I480 Paroxysmal atrial fibrillation: Secondary | ICD-10-CM | POA: Diagnosis not present

## 2021-07-30 DIAGNOSIS — I63312 Cerebral infarction due to thrombosis of left middle cerebral artery: Secondary | ICD-10-CM | POA: Diagnosis not present

## 2021-07-30 DIAGNOSIS — I69351 Hemiplegia and hemiparesis following cerebral infarction affecting right dominant side: Secondary | ICD-10-CM | POA: Diagnosis not present

## 2021-07-30 DIAGNOSIS — M6389 Disorders of muscle in diseases classified elsewhere, multiple sites: Secondary | ICD-10-CM | POA: Diagnosis not present

## 2021-07-30 DIAGNOSIS — R471 Dysarthria and anarthria: Secondary | ICD-10-CM | POA: Diagnosis not present

## 2021-07-31 ENCOUNTER — Encounter: Payer: Self-pay | Admitting: Orthopedic Surgery

## 2021-07-31 ENCOUNTER — Non-Acute Institutional Stay (SKILLED_NURSING_FACILITY): Payer: Medicare Other | Admitting: Orthopedic Surgery

## 2021-07-31 DIAGNOSIS — I69351 Hemiplegia and hemiparesis following cerebral infarction affecting right dominant side: Secondary | ICD-10-CM | POA: Diagnosis not present

## 2021-07-31 DIAGNOSIS — J302 Other seasonal allergic rhinitis: Secondary | ICD-10-CM | POA: Diagnosis not present

## 2021-07-31 DIAGNOSIS — I480 Paroxysmal atrial fibrillation: Secondary | ICD-10-CM | POA: Diagnosis not present

## 2021-07-31 DIAGNOSIS — I63312 Cerebral infarction due to thrombosis of left middle cerebral artery: Secondary | ICD-10-CM | POA: Diagnosis not present

## 2021-07-31 DIAGNOSIS — R482 Apraxia: Secondary | ICD-10-CM | POA: Diagnosis not present

## 2021-07-31 DIAGNOSIS — R0602 Shortness of breath: Secondary | ICD-10-CM

## 2021-07-31 DIAGNOSIS — M62561 Muscle wasting and atrophy, not elsewhere classified, right lower leg: Secondary | ICD-10-CM | POA: Diagnosis not present

## 2021-07-31 DIAGNOSIS — R2689 Other abnormalities of gait and mobility: Secondary | ICD-10-CM | POA: Diagnosis not present

## 2021-07-31 DIAGNOSIS — R471 Dysarthria and anarthria: Secondary | ICD-10-CM | POA: Diagnosis not present

## 2021-07-31 DIAGNOSIS — R278 Other lack of coordination: Secondary | ICD-10-CM | POA: Diagnosis not present

## 2021-07-31 DIAGNOSIS — I5032 Chronic diastolic (congestive) heart failure: Secondary | ICD-10-CM | POA: Diagnosis not present

## 2021-07-31 DIAGNOSIS — I6602 Occlusion and stenosis of left middle cerebral artery: Secondary | ICD-10-CM | POA: Diagnosis not present

## 2021-07-31 DIAGNOSIS — J9601 Acute respiratory failure with hypoxia: Secondary | ICD-10-CM | POA: Diagnosis not present

## 2021-07-31 NOTE — Progress Notes (Signed)
?Location:  [place] ?Nursing Home Room Number: 116/A ?Place of Service:  SNF (31) ?Provider: Yvonna Alanis, NP ? ?Patient Care Team: ?Virgie Dad, MD as PCP - General (Internal Medicine) ?Jerline Pain, MD as PCP - Cardiology (Cardiology) ?Deboraha Sprang, MD as PCP - Electrophysiology (Cardiology) ?Jovita Gamma, MD as Consulting Physician (Neurosurgery) ? ?Extended Emergency Contact Information ?Primary Emergency Contact: Panameno,[name] ?Address: [address] ?         [place] ?Home Phone: [phone] ?Mobile Phone: [phone] ?Relation: Spouse ?Secondary Emergency Contact: Bruington,[name] ?Address: [address] ?         [place],  [address] [place] ?Mobile Phone: [phone] ?Relation: Son ? ?Code Status:  Full code ?Goals of care: Advanced Directive information ? ?  07/31/2021  ?  1:05 PM  ?Advanced Directives  ?Does Patient Have a Medical Advance Directive? Yes  ?Type of Advance Directive Living will  ?Does patient want to make changes to medical advance directive? No - Patient declined  ? ? ? ?Chief Complaint  ?Patient presents with  ? Acute Visit  ?  Shortness of breath  ? ? ?HPI:  ?Pt is a 76 y.o. female seen today for an acute visit for shortness of breath.  ? ?She currently resides on the skilled nursing unit at [facility] 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  ? ?Today, she reports having increased shortness of breath while working with PT. Husband reports she was not given her scheduled inhaler this morning prior to session.  O2 sat 95% on 2LNC while ambulating with PT, 97% at rest. She was given her inhaler after PT and symptoms subsided. H/o COPD, severe obstruction per pulmonary. Remains on Spiriva and duonebs prn. Brovana discontinued in past.  ? ?She also reports increased allergies within the past week. Symptoms  include watery eyes and nasal congestion. Requesting to be put on Claritin during pollen season.  ? ?Past Medical History:  ?Diagnosis Date  ? Anxiety   ? Arthritis   ? "some in my lower back; probably elbows, knees" (11/18/2017)  ? Atrial fibrillation ([place])   ? Bell's palsy   ? when pt. was 76 yrs old, when under stress the left side of face will droop.  ? Complication of anesthesia   ? "vascular OR 2016; BP bottomed out; couldn't get it regulated; ended up in ICU for DAYS" (11/18/2017)  ? GERD (gastroesophageal reflux disease)   ? History of kidney stones   ? Hypertension   ? Hypertrophic cardiomyopathy ([place])   ? severe LV basilar hypertrophy witn no evidence of significant outflow tract obstruction, EF 65-70%, mild LAE, mild TR, grade 1a diastolic dysfunction [id number] (Dr. Candee Furbish) (Atrial Septal Hypertrophy pattern)-- Intra-op TEE with dsignificant outflow tract obstruction - AI, MR & TR  ? Insomnia   ? Mild aortic sclerosis   ? Osteopenia   ? Peripheral vascular disease ([place])   ? Syncope   ? , Vagal  ? ?Past Surgical History:  ?Procedure Laterality Date  ? AUGMENTATION MAMMAPLASTY Bilateral   ? BACK SURGERY    ? CARDIAC CATHETERIZATION N/A 05/07/2016  ? Procedure: Left Heart Cath and Coronary Angiography;  Surgeon: Burnell Blanks, MD;  Location: [place];  Service: Cardiovascular;  Laterality: N/A;  ? CARDIOVERSION N/A 09/24/2017  ? Procedure: CARDIOVERSION;  Surgeon: Larey Dresser, MD;  Location: [facility]  ENDOSCOPY;  Service: Cardiovascular;  Laterality: N/A;  ? DILATION AND CURETTAGE OF UTERUS    ? ENDARTERECTOMY FEMORAL Right 03/02/2015  ? Procedure: ENDARTERECTOMY RIGHT FEMORAL;  Surgeon: Angelia Mould, MD;  Location: [place];  Service: Vascular;  Laterality: Right;  ? ESOPHAGOGASTRODUODENOSCOPY (EGD) WITH PROPOFOL N/A 12/01/2020  ? Procedure: ESOPHAGOGASTRODUODENOSCOPY (EGD) WITH PROPOFOL;  Surgeon: Jesusita Oka, MD;  Location: [place] ENDOSCOPY;  Service: General;  Laterality: N/A;  ?  FACIAL COSMETIC SURGERY Left 2002  ? "related to [place] @ age 53; left eye/side of face droopy; tried to make area symmetrical"  ? FEMORAL-POPLITEAL BYPASS GRAFT Right 03/02/2015  ? Procedure: BYPASS GRAFT FEMORAL-BELOW KNEE POPLITEAL ARTERY;  Surgeon: Angelia Mould, MD;  Location: [place];  Service: Vascular;  Laterality: Right;  ? INGUINAL HERNIA REPAIR Bilateral 2002  ? IR ANGIO INTRA EXTRACRAN SEL COM CAROTID INNOMINATE UNI L MOD SED  11/16/2020  ? IR CT HEAD LTD  11/13/2020  ? IR PERCUTANEOUS ART THROMBECTOMY/INFUSION INTRACRANIAL INC DIAG ANGIO  11/13/2020  ? OVARIAN CYST REMOVAL Left   ? PEG PLACEMENT N/A 12/01/2020  ? Procedure: PERCUTANEOUS ENDOSCOPIC GASTROSTOMY (PEG) PLACEMENT;  Surgeon: Jesusita Oka, MD;  Location: [place] ENDOSCOPY;  Service: General;  Laterality: N/A;  ? PERIPHERAL VASCULAR CATHETERIZATION N/A 01/16/2015  ? Procedure: Abdominal Aortogram;  Surgeon: Angelia Mould, MD;  Location: [place] CV LAB;  Service: Cardiovascular;  Laterality: N/A;  ? POSTERIOR LUMBAR FUSION  2015  ? "have plates and screws in there"  ? RADIOLOGY WITH ANESTHESIA N/A 11/13/2020  ? Procedure: IR WITH ANESTHESIA;  Surgeon: Radiologist, Medication, MD;  Location: [place];  Service: Radiology;  Laterality: N/A;  ? TONSILLECTOMY    ? ? ?Allergies  ?Allergen Reactions  ? Amoxicillin Other (See Comments)  ?  UTI ?Has patient had a PCN reaction causing immediate rash, facial/tongue/throat swelling, SOB or lightheadedness with hypotension: No ?Has patient had a PCN reaction causing severe rash involving mucus membranes or skin necrosis: No ?Has patient had a PCN reaction that required hospitalization: No ?Has patient had a PCN reaction occurring within the last 10 years: Yes--UTI ONLY ?If all of the above answers are "NO", then may proceed with Cephalosporin use. ?  ? Atenolol Cough  ? Crestor [Rosuvastatin Calcium] Other (See Comments)  ?  Muscle aches  ? Pravastatin Other (See Comments)  ?  Muscle aches  ?  Sulfa Antibiotics Nausea Only  ? Codeine Other (See Comments)  ?  hallucinations  ? ? ?Outpatient Encounter Medications as of 07/31/2021  ?Medication Sig  ? aluminum-magnesium hydroxide-simethicone (MAALOX) [id number] MG/5ML SUSP Take 30 mLs by mouth every 4 (four) hours as needed (heartburn).  ? amiodarone (PACERONE) 200 MG tablet Take 200 mg by mouth daily. May give crushed  ? amLODipine (NORVASC) 5 MG tablet Take 5 mg by mouth daily.  ? apixaban (ELIQUIS) 5 MG TABS tablet Take 5 mg by mouth 2 (two) times daily. May give crushed  ? bisacodyl (DULCOLAX) 5 MG EC tablet Take 5 mg by mouth 3 (three) times daily as needed for moderate constipation.  ? buPROPion ER (WELLBUTRIN SR) 100 MG 12 hr tablet Take 100 mg by mouth 2 (two) times daily.  ? ezetimibe (ZETIA) 10 MG tablet Take 10 mg by mouth daily. Oral if crushed  ? furosemide (LASIX) 20 MG tablet Take 20 mg by mouth daily. Give Lasix 20mg  for weight gain equal or greater than 3 pounds  ? hydrALAZINE (APRESOLINE) 10 MG tablet Take 10 mg  by mouth 3 (three) times daily as needed (SBP > 165 or DBP > 95).  ? ipratropium-albuterol (DUONEB) 0.5-2.5 (3) MG/3ML SOLN Take 3 mLs by nebulization every 4 (four) hours as needed.  ? Ketoconazole 2 % GEL Apply 1 application. topically daily.  ? nystatin cream (MYCOSTATIN) Apply 1 application topically 2 (two) times daily as needed for dry skin (apply to perirectal rash).  ? OXYGEN Inhale 3 L into the lungs as directed. to maintain sats > or equal to 90%. May titrate ?Three Times A Day; 07:00 AM - 03:00 PM, 03:00 PM - 11:00 PM, 11:00 PM - 7:00 am  ? pantoprazole (PROTONIX) 40 MG tablet Take 40 mg by mouth 2 (two) times daily.  ? Polyethyl Glycol-Propyl Glycol (SYSTANE) 0.4-0.3 % SOLN Place 2 drops into both eyes 4 (four) times daily as needed (irritation).  ? tiotropium (SPIRIVA HANDIHALER) 18 MCG inhalation capsule Place 1 capsule (18 mcg total) into inhaler and inhale daily.  ? Water For Irrigation, Sterile (FREE WATER) SOLN Place  250 mLs into feeding tube 6 (six) times daily.  ? zinc oxide 20 % ointment Apply 1 application. topically 2 (two) times daily as needed for irritation (apply to perirectal area prn - use with nystatin).  ? [place]

## 2021-07-31 NOTE — Addendum Note (Signed)
Encounter addended by: Andee Lineman A on: 07/31/2021 8:11 AM ? Actions taken: Imaging Exam ended

## 2021-08-01 ENCOUNTER — Ambulatory Visit: Payer: Medicare Other | Admitting: Physician Assistant

## 2021-08-01 DIAGNOSIS — R482 Apraxia: Secondary | ICD-10-CM | POA: Diagnosis not present

## 2021-08-01 DIAGNOSIS — I480 Paroxysmal atrial fibrillation: Secondary | ICD-10-CM | POA: Diagnosis not present

## 2021-08-01 DIAGNOSIS — I69351 Hemiplegia and hemiparesis following cerebral infarction affecting right dominant side: Secondary | ICD-10-CM | POA: Diagnosis not present

## 2021-08-01 DIAGNOSIS — R2689 Other abnormalities of gait and mobility: Secondary | ICD-10-CM | POA: Diagnosis not present

## 2021-08-01 DIAGNOSIS — R001 Bradycardia, unspecified: Secondary | ICD-10-CM | POA: Diagnosis not present

## 2021-08-01 DIAGNOSIS — R278 Other lack of coordination: Secondary | ICD-10-CM | POA: Diagnosis not present

## 2021-08-01 DIAGNOSIS — J9601 Acute respiratory failure with hypoxia: Secondary | ICD-10-CM | POA: Diagnosis not present

## 2021-08-01 DIAGNOSIS — R471 Dysarthria and anarthria: Secondary | ICD-10-CM | POA: Diagnosis not present

## 2021-08-01 DIAGNOSIS — M6389 Disorders of muscle in diseases classified elsewhere, multiple sites: Secondary | ICD-10-CM | POA: Diagnosis not present

## 2021-08-01 DIAGNOSIS — I5032 Chronic diastolic (congestive) heart failure: Secondary | ICD-10-CM | POA: Diagnosis not present

## 2021-08-01 DIAGNOSIS — I6602 Occlusion and stenosis of left middle cerebral artery: Secondary | ICD-10-CM | POA: Diagnosis not present

## 2021-08-01 DIAGNOSIS — I63312 Cerebral infarction due to thrombosis of left middle cerebral artery: Secondary | ICD-10-CM | POA: Diagnosis not present

## 2021-08-02 DIAGNOSIS — R471 Dysarthria and anarthria: Secondary | ICD-10-CM | POA: Diagnosis not present

## 2021-08-02 DIAGNOSIS — R2689 Other abnormalities of gait and mobility: Secondary | ICD-10-CM | POA: Diagnosis not present

## 2021-08-02 DIAGNOSIS — J9601 Acute respiratory failure with hypoxia: Secondary | ICD-10-CM | POA: Diagnosis not present

## 2021-08-02 DIAGNOSIS — I480 Paroxysmal atrial fibrillation: Secondary | ICD-10-CM | POA: Diagnosis not present

## 2021-08-02 DIAGNOSIS — I69351 Hemiplegia and hemiparesis following cerebral infarction affecting right dominant side: Secondary | ICD-10-CM | POA: Diagnosis not present

## 2021-08-02 DIAGNOSIS — I6602 Occlusion and stenosis of left middle cerebral artery: Secondary | ICD-10-CM | POA: Diagnosis not present

## 2021-08-02 DIAGNOSIS — I63312 Cerebral infarction due to thrombosis of left middle cerebral artery: Secondary | ICD-10-CM | POA: Diagnosis not present

## 2021-08-02 DIAGNOSIS — R482 Apraxia: Secondary | ICD-10-CM | POA: Diagnosis not present

## 2021-08-02 DIAGNOSIS — I5032 Chronic diastolic (congestive) heart failure: Secondary | ICD-10-CM | POA: Diagnosis not present

## 2021-08-03 ENCOUNTER — Ambulatory Visit: Payer: Medicare Other | Admitting: Internal Medicine

## 2021-08-03 ENCOUNTER — Encounter: Payer: Self-pay | Admitting: Internal Medicine

## 2021-08-03 VITALS — BP 100/70 | HR 74 | Ht 63.0 in | Wt 101.6 lb

## 2021-08-03 DIAGNOSIS — R001 Bradycardia, unspecified: Secondary | ICD-10-CM | POA: Diagnosis not present

## 2021-08-03 DIAGNOSIS — I69351 Hemiplegia and hemiparesis following cerebral infarction affecting right dominant side: Secondary | ICD-10-CM | POA: Diagnosis not present

## 2021-08-03 DIAGNOSIS — I421 Obstructive hypertrophic cardiomyopathy: Secondary | ICD-10-CM

## 2021-08-03 DIAGNOSIS — I480 Paroxysmal atrial fibrillation: Secondary | ICD-10-CM

## 2021-08-03 DIAGNOSIS — R278 Other lack of coordination: Secondary | ICD-10-CM | POA: Diagnosis not present

## 2021-08-03 DIAGNOSIS — I63312 Cerebral infarction due to thrombosis of left middle cerebral artery: Secondary | ICD-10-CM | POA: Diagnosis not present

## 2021-08-03 DIAGNOSIS — R293 Abnormal posture: Secondary | ICD-10-CM | POA: Diagnosis not present

## 2021-08-03 DIAGNOSIS — M6389 Disorders of muscle in diseases classified elsewhere, multiple sites: Secondary | ICD-10-CM | POA: Diagnosis not present

## 2021-08-03 NOTE — Patient Instructions (Signed)
Medication Instructions:  Your physician recommends that you continue on your current medications as directed. Please refer to the Current Medication list given to you today.  *If you need a refill on your cardiac medications before your next appointment, please call your pharmacy*   Lab Work: None ordered.  If you have labs (blood work) drawn today and your tests are completely normal, you will receive your results only by: MyChart Message (if you have MyChart) OR A paper copy in the mail If you have any lab test that is abnormal or we need to change your treatment, we will call you to review the results.   Testing/Procedures: None ordered.    Follow-Up: At [facility], you and your health needs are our priority.  As part of our continuing mission to provide you with exceptional heart care, we have created designated Provider Care Teams.  These Care Teams include your primary Cardiologist (physician) and Advanced Practice Providers (APPs -  Physician Assistants and Nurse Practitioners) who all work together to provide you with the care you need, when you need it.  We recommend signing up for the patient portal called "MyChart".  Sign up information is provided on this After Visit Summary.  MyChart is used to connect with patients for Virtual Visits (Telemedicine).  Patients are able to view lab/test results, encounter notes, upcoming appointments, etc.  Non-urgent messages can be sent to your provider as well.   To learn more about what you can do with MyChart, go to [email or web address].    Your next appointment:   12 months with Dr Klein  Important Information About Sugar       

## 2021-08-03 NOTE — Progress Notes (Signed)
? ? ? ? ?Patient Care Team: ?Virgie Dad, MD as PCP - General (Internal Medicine) ?Jerline Pain, MD as PCP - Cardiology (Cardiology) ?Deboraha Sprang, MD as PCP - Electrophysiology (Cardiology) ?Jovita Gamma, MD as Consulting Physician (Neurosurgery) ? ? ?HPI ? ?Beverly Li is a 76 y.o. female   ?Seen in follow-up for partially obstructive hypertrophic cardiomyopathy.  Has a history of labile hypertension and during vascular procedure 11/16 she developed hypotension and suffered a respiratory arrest.  She underwent intubation and phenylephrine infusion.   ? ?1/18 she underwent catheterization for chest pain.  No coronary disease was noted but she again had significant postprocedural hypertension and received IV hydralazine.  She became profoundly bradycardic and hypotensive again requiring Neo-Synephrine.  She developed respiratory distress but did not require intubation. ? ?Previously taking dofetilide for atrial fibrillation now on amiodarone, started during one of her multiple hospitalizations as outlined below ? ?Intercurrently, 9/22 developed left middle cerebral artery stroke treated with thrombectomy and complicated by persistent right hemiparesis dysphagia requiring PEG tube.  And then by post discharge pneumonia   a few weeks later had a second stroke not withstanding anticoagulation ? ?Hospitalized 3/23 with bradycardia which improved following discontinuation of longstanding metoprolol; amiodarone was continued ? ?On Eliquis without bleeding. ? ?  ? ?Date Cr K Hgb  ?7/20 0.63 4.0 14.0  ?      ? ?DATE TEST EF   ?4/18 cMRI   % LGE  septum 21  ?"spade like"  ?9/20 Echo   60-65%  Severe LVH (17/14)  LAE (43 cc/m2)  ?     ? ? ? ?Records and Results Reviewed  ? ?Past Medical History:  ?Diagnosis Date  ? Anxiety   ? Arthritis   ? "some in my lower back; probably elbows, knees" (11/18/2017)  ? Atrial fibrillation ([place])   ? Bell's palsy   ? when pt. was 76 yrs old, when under stress the left side of  face will droop.  ? Complication of anesthesia   ? "vascular OR 2016; BP bottomed out; couldn't get it regulated; ended up in ICU for DAYS" (11/18/2017)  ? GERD (gastroesophageal reflux disease)   ? History of kidney stones   ? Hypertension   ? Hypertrophic cardiomyopathy ([place])   ? severe LV basilar hypertrophy witn no evidence of significant outflow tract obstruction, EF 65-70%, mild LAE, mild TR, grade 1a diastolic dysfunction [id number] (Dr. Candee Furbish) (Atrial Septal Hypertrophy pattern)-- Intra-op TEE with dsignificant outflow tract obstruction - AI, MR & TR  ? Insomnia   ? Mild aortic sclerosis   ? Osteopenia   ? Peripheral vascular disease ([place])   ? Syncope   ? , Vagal  ? ? ?Past Surgical History:  ?Procedure Laterality Date  ? AUGMENTATION MAMMAPLASTY Bilateral   ? BACK SURGERY    ? CARDIAC CATHETERIZATION N/A 05/07/2016  ? Procedure: Left Heart Cath and Coronary Angiography;  Surgeon: Burnell Blanks, MD;  Location: [place] [facility];  Service: Cardiovascular;  Laterality: N/A;  ? CARDIOVERSION N/A 09/24/2017  ? Procedure: CARDIOVERSION;  Surgeon: Larey Dresser, MD;  Location: [facility] ENDOSCOPY;  Service: Cardiovascular;  Laterality: N/A;  ? DILATION AND CURETTAGE OF UTERUS    ? ENDARTERECTOMY FEMORAL Right 03/02/2015  ? Procedure: ENDARTERECTOMY RIGHT FEMORAL;  Surgeon: Angelia Mould, MD;  Location: [place];  Service: Vascular;  Laterality: Right;  ? ESOPHAGOGASTRODUODENOSCOPY (EGD) WITH PROPOFOL N/A 12/01/2020  ? Procedure: ESOPHAGOGASTRODUODENOSCOPY (EGD) WITH PROPOFOL;  Surgeon: Reather Laurence  N, MD;  Location: [place];  Service: General;  Laterality: N/A;  ? FACIAL COSMETIC SURGERY Left 2002  ? "related to Bell's Palsy @ age 1; left eye/side of face droopy; tried to make area symmetrical"  ? FEMORAL-POPLITEAL BYPASS GRAFT Right 03/02/2015  ? Procedure: BYPASS GRAFT FEMORAL-BELOW KNEE POPLITEAL ARTERY;  Surgeon: Angelia Mould, MD;  Location: [place];  Service: Vascular;  Laterality:  Right;  ? INGUINAL HERNIA REPAIR Bilateral 2002  ? IR ANGIO INTRA EXTRACRAN SEL COM CAROTID INNOMINATE UNI L MOD SED  11/16/2020  ? IR CT HEAD LTD  11/13/2020  ? IR PERCUTANEOUS ART THROMBECTOMY/INFUSION INTRACRANIAL INC DIAG ANGIO  11/13/2020  ? OVARIAN CYST REMOVAL Left   ? PEG PLACEMENT N/A 12/01/2020  ? Procedure: PERCUTANEOUS ENDOSCOPIC GASTROSTOMY (PEG) PLACEMENT;  Surgeon: Jesusita Oka, MD;  Location: [place] ENDOSCOPY;  Service: General;  Laterality: N/A;  ? PERIPHERAL VASCULAR CATHETERIZATION N/A 01/16/2015  ? Procedure: Abdominal Aortogram;  Surgeon: Angelia Mould, MD;  Location: [place] [facility];  Service: Cardiovascular;  Laterality: N/A;  ? POSTERIOR LUMBAR FUSION  2015  ? "have plates and screws in there"  ? RADIOLOGY WITH ANESTHESIA N/A 11/13/2020  ? Procedure: IR WITH ANESTHESIA;  Surgeon: Radiologist, Medication, MD;  Location: [place];  Service: Radiology;  Laterality: N/A;  ? TONSILLECTOMY    ? ? ?Current Meds  ?Medication Sig  ? aluminum-magnesium hydroxide-simethicone (MAALOX) [id number] MG/5ML SUSP Take 30 mLs by mouth every 4 (four) hours as needed (heartburn).  ? amiodarone (PACERONE) 200 MG tablet Take 200 mg by mouth daily. May give crushed  ? amLODipine (NORVASC) 5 MG tablet Take 5 mg by mouth daily.  ? apixaban (ELIQUIS) 5 MG TABS tablet Take 5 mg by mouth 2 (two) times daily. May give crushed  ? bisacodyl (DULCOLAX) 5 MG EC tablet Take 5 mg by mouth 3 (three) times daily as needed for moderate constipation.  ? buPROPion ER (WELLBUTRIN SR) 100 MG 12 hr tablet Take 100 mg by mouth 2 (two) times daily.  ? ezetimibe (ZETIA) 10 MG tablet Take 10 mg by mouth daily. Oral if crushed  ? furosemide (LASIX) 20 MG tablet Take 20 mg by mouth daily. Give Lasix 20mg  for weight gain equal or greater than 3 pounds  ? hydrALAZINE (APRESOLINE) 10 MG tablet Take 10 mg by mouth 3 (three) times daily as needed (SBP > 165 or DBP > 95).  ? ipratropium-albuterol (DUONEB) 0.5-2.5 (3) MG/3ML SOLN Take 3 mLs by  nebulization every 4 (four) hours as needed.  ? Ketoconazole 2 % GEL Apply 1 application. topically daily.  ? nystatin cream (MYCOSTATIN) Apply 1 application topically 2 (two) times daily as needed for dry skin (apply to perirectal rash).  ? OXYGEN Inhale 3 L into the lungs as directed. to maintain sats > or equal to 90%. May titrate ?Three Times A Day; 07:00 AM - 03:00 PM, 03:00 PM - 11:00 PM, 11:00 PM - 7:00 am  ? pantoprazole (PROTONIX) 40 MG tablet Take 40 mg by mouth 2 (two) times daily.  ? Polyethyl Glycol-Propyl Glycol (SYSTANE) 0.4-0.3 % SOLN Place 2 drops into both eyes 4 (four) times daily as needed (irritation).  ? tiotropium (SPIRIVA HANDIHALER) 18 MCG inhalation capsule Place 1 capsule (18 mcg total) into inhaler and inhale daily.  ? Water For Irrigation, Sterile (FREE WATER) SOLN Place 250 mLs into feeding tube 6 (six) times daily.  ? zinc oxide 20 % ointment Apply 1 application. topically 2 (two) times daily as  needed for irritation (apply to perirectal area prn - use with nystatin).  ? ? ?Allergies  ?Allergen Reactions  ? Amoxicillin Other (See Comments)  ?  UTI ?Has patient had a PCN reaction causing immediate rash, facial/tongue/throat swelling, SOB or lightheadedness with hypotension: No ?Has patient had a PCN reaction causing severe rash involving mucus membranes or skin necrosis: No ?Has patient had a PCN reaction that required hospitalization: No ?Has patient had a PCN reaction occurring within the last 10 years: Yes--UTI ONLY ?If all of the above answers are "NO", then may proceed with Cephalosporin use. ?  ? Atenolol Cough  ? Crestor [Rosuvastatin Calcium] Other (See Comments)  ?  Muscle aches  ? Pravastatin Other (See Comments)  ?  Muscle aches  ? Sulfa Antibiotics Nausea Only  ? Codeine Other (See Comments)  ?  hallucinations  ? ? ? ? ?Review of Systems negative except from HPI and PMH ? ?Physical Exam ?BP 100/70 (BP Location: Left Arm, Patient Position: Sitting, Cuff Size: Normal)   Pulse  74   Ht 5\' 3"  (1.6 m)   Wt 101 lb 9.6 oz (46.1 kg)   LMP  (LMP Unknown)   SpO2 94%   BMI 18.00 kg/m?  ?Well developed and well nourished in no acute distress ?HENT normal ?Neck supple with JVP-flat ?Clear ?Kerrin Mo

## 2021-08-06 DIAGNOSIS — H2513 Age-related nuclear cataract, bilateral: Secondary | ICD-10-CM | POA: Diagnosis not present

## 2021-08-06 DIAGNOSIS — I69351 Hemiplegia and hemiparesis following cerebral infarction affecting right dominant side: Secondary | ICD-10-CM | POA: Diagnosis not present

## 2021-08-06 DIAGNOSIS — I5032 Chronic diastolic (congestive) heart failure: Secondary | ICD-10-CM | POA: Diagnosis not present

## 2021-08-06 DIAGNOSIS — H01004 Unspecified blepharitis left upper eyelid: Secondary | ICD-10-CM | POA: Diagnosis not present

## 2021-08-06 DIAGNOSIS — J9601 Acute respiratory failure with hypoxia: Secondary | ICD-10-CM | POA: Diagnosis not present

## 2021-08-06 DIAGNOSIS — I63312 Cerebral infarction due to thrombosis of left middle cerebral artery: Secondary | ICD-10-CM | POA: Diagnosis not present

## 2021-08-06 DIAGNOSIS — R2689 Other abnormalities of gait and mobility: Secondary | ICD-10-CM | POA: Diagnosis not present

## 2021-08-06 DIAGNOSIS — H01001 Unspecified blepharitis right upper eyelid: Secondary | ICD-10-CM | POA: Diagnosis not present

## 2021-08-07 DIAGNOSIS — I480 Paroxysmal atrial fibrillation: Secondary | ICD-10-CM | POA: Diagnosis not present

## 2021-08-07 DIAGNOSIS — R001 Bradycardia, unspecified: Secondary | ICD-10-CM | POA: Diagnosis not present

## 2021-08-07 DIAGNOSIS — R471 Dysarthria and anarthria: Secondary | ICD-10-CM | POA: Diagnosis not present

## 2021-08-07 DIAGNOSIS — R482 Apraxia: Secondary | ICD-10-CM | POA: Diagnosis not present

## 2021-08-07 DIAGNOSIS — I6602 Occlusion and stenosis of left middle cerebral artery: Secondary | ICD-10-CM | POA: Diagnosis not present

## 2021-08-07 DIAGNOSIS — R2689 Other abnormalities of gait and mobility: Secondary | ICD-10-CM | POA: Diagnosis not present

## 2021-08-07 DIAGNOSIS — I63312 Cerebral infarction due to thrombosis of left middle cerebral artery: Secondary | ICD-10-CM | POA: Diagnosis not present

## 2021-08-07 DIAGNOSIS — J9601 Acute respiratory failure with hypoxia: Secondary | ICD-10-CM | POA: Diagnosis not present

## 2021-08-07 DIAGNOSIS — M6389 Disorders of muscle in diseases classified elsewhere, multiple sites: Secondary | ICD-10-CM | POA: Diagnosis not present

## 2021-08-07 DIAGNOSIS — R278 Other lack of coordination: Secondary | ICD-10-CM | POA: Diagnosis not present

## 2021-08-07 DIAGNOSIS — I5032 Chronic diastolic (congestive) heart failure: Secondary | ICD-10-CM | POA: Diagnosis not present

## 2021-08-07 DIAGNOSIS — I69351 Hemiplegia and hemiparesis following cerebral infarction affecting right dominant side: Secondary | ICD-10-CM | POA: Diagnosis not present

## 2021-08-08 DIAGNOSIS — R471 Dysarthria and anarthria: Secondary | ICD-10-CM | POA: Diagnosis not present

## 2021-08-08 DIAGNOSIS — R2689 Other abnormalities of gait and mobility: Secondary | ICD-10-CM | POA: Diagnosis not present

## 2021-08-08 DIAGNOSIS — R001 Bradycardia, unspecified: Secondary | ICD-10-CM | POA: Diagnosis not present

## 2021-08-08 DIAGNOSIS — I69351 Hemiplegia and hemiparesis following cerebral infarction affecting right dominant side: Secondary | ICD-10-CM | POA: Diagnosis not present

## 2021-08-08 DIAGNOSIS — I6602 Occlusion and stenosis of left middle cerebral artery: Secondary | ICD-10-CM | POA: Diagnosis not present

## 2021-08-08 DIAGNOSIS — R278 Other lack of coordination: Secondary | ICD-10-CM | POA: Diagnosis not present

## 2021-08-08 DIAGNOSIS — M6389 Disorders of muscle in diseases classified elsewhere, multiple sites: Secondary | ICD-10-CM | POA: Diagnosis not present

## 2021-08-08 DIAGNOSIS — I5032 Chronic diastolic (congestive) heart failure: Secondary | ICD-10-CM | POA: Diagnosis not present

## 2021-08-08 DIAGNOSIS — R482 Apraxia: Secondary | ICD-10-CM | POA: Diagnosis not present

## 2021-08-08 DIAGNOSIS — J9601 Acute respiratory failure with hypoxia: Secondary | ICD-10-CM | POA: Diagnosis not present

## 2021-08-08 DIAGNOSIS — I480 Paroxysmal atrial fibrillation: Secondary | ICD-10-CM | POA: Diagnosis not present

## 2021-08-08 DIAGNOSIS — I63312 Cerebral infarction due to thrombosis of left middle cerebral artery: Secondary | ICD-10-CM | POA: Diagnosis not present

## 2021-08-10 DIAGNOSIS — I63312 Cerebral infarction due to thrombosis of left middle cerebral artery: Secondary | ICD-10-CM | POA: Diagnosis not present

## 2021-08-10 DIAGNOSIS — M6389 Disorders of muscle in diseases classified elsewhere, multiple sites: Secondary | ICD-10-CM | POA: Diagnosis not present

## 2021-08-10 DIAGNOSIS — R001 Bradycardia, unspecified: Secondary | ICD-10-CM | POA: Diagnosis not present

## 2021-08-10 DIAGNOSIS — I5032 Chronic diastolic (congestive) heart failure: Secondary | ICD-10-CM | POA: Diagnosis not present

## 2021-08-10 DIAGNOSIS — R278 Other lack of coordination: Secondary | ICD-10-CM | POA: Diagnosis not present

## 2021-08-10 DIAGNOSIS — R293 Abnormal posture: Secondary | ICD-10-CM | POA: Diagnosis not present

## 2021-08-10 DIAGNOSIS — I69351 Hemiplegia and hemiparesis following cerebral infarction affecting right dominant side: Secondary | ICD-10-CM | POA: Diagnosis not present

## 2021-08-10 DIAGNOSIS — J9601 Acute respiratory failure with hypoxia: Secondary | ICD-10-CM | POA: Diagnosis not present

## 2021-08-10 DIAGNOSIS — R2689 Other abnormalities of gait and mobility: Secondary | ICD-10-CM | POA: Diagnosis not present

## 2021-08-13 DIAGNOSIS — I6602 Occlusion and stenosis of left middle cerebral artery: Secondary | ICD-10-CM | POA: Diagnosis not present

## 2021-08-13 DIAGNOSIS — I5032 Chronic diastolic (congestive) heart failure: Secondary | ICD-10-CM | POA: Diagnosis not present

## 2021-08-13 DIAGNOSIS — I69351 Hemiplegia and hemiparesis following cerebral infarction affecting right dominant side: Secondary | ICD-10-CM | POA: Diagnosis not present

## 2021-08-13 DIAGNOSIS — M62561 Muscle wasting and atrophy, not elsewhere classified, right lower leg: Secondary | ICD-10-CM | POA: Diagnosis not present

## 2021-08-13 DIAGNOSIS — R471 Dysarthria and anarthria: Secondary | ICD-10-CM | POA: Diagnosis not present

## 2021-08-13 DIAGNOSIS — R278 Other lack of coordination: Secondary | ICD-10-CM | POA: Diagnosis not present

## 2021-08-13 DIAGNOSIS — R482 Apraxia: Secondary | ICD-10-CM | POA: Diagnosis not present

## 2021-08-13 DIAGNOSIS — R293 Abnormal posture: Secondary | ICD-10-CM | POA: Diagnosis not present

## 2021-08-13 DIAGNOSIS — I63312 Cerebral infarction due to thrombosis of left middle cerebral artery: Secondary | ICD-10-CM | POA: Diagnosis not present

## 2021-08-13 DIAGNOSIS — R001 Bradycardia, unspecified: Secondary | ICD-10-CM | POA: Diagnosis not present

## 2021-08-13 DIAGNOSIS — I480 Paroxysmal atrial fibrillation: Secondary | ICD-10-CM | POA: Diagnosis not present

## 2021-08-13 DIAGNOSIS — M6389 Disorders of muscle in diseases classified elsewhere, multiple sites: Secondary | ICD-10-CM | POA: Diagnosis not present

## 2021-08-13 DIAGNOSIS — J9601 Acute respiratory failure with hypoxia: Secondary | ICD-10-CM | POA: Diagnosis not present

## 2021-08-13 DIAGNOSIS — R2689 Other abnormalities of gait and mobility: Secondary | ICD-10-CM | POA: Diagnosis not present

## 2021-08-14 DIAGNOSIS — I69351 Hemiplegia and hemiparesis following cerebral infarction affecting right dominant side: Secondary | ICD-10-CM | POA: Diagnosis not present

## 2021-08-14 DIAGNOSIS — R2689 Other abnormalities of gait and mobility: Secondary | ICD-10-CM | POA: Diagnosis not present

## 2021-08-14 DIAGNOSIS — I5032 Chronic diastolic (congestive) heart failure: Secondary | ICD-10-CM | POA: Diagnosis not present

## 2021-08-14 DIAGNOSIS — J9601 Acute respiratory failure with hypoxia: Secondary | ICD-10-CM | POA: Diagnosis not present

## 2021-08-14 DIAGNOSIS — I63312 Cerebral infarction due to thrombosis of left middle cerebral artery: Secondary | ICD-10-CM | POA: Diagnosis not present

## 2021-08-15 DIAGNOSIS — R278 Other lack of coordination: Secondary | ICD-10-CM | POA: Diagnosis not present

## 2021-08-15 DIAGNOSIS — J9601 Acute respiratory failure with hypoxia: Secondary | ICD-10-CM | POA: Diagnosis not present

## 2021-08-15 DIAGNOSIS — M6389 Disorders of muscle in diseases classified elsewhere, multiple sites: Secondary | ICD-10-CM | POA: Diagnosis not present

## 2021-08-15 DIAGNOSIS — I5032 Chronic diastolic (congestive) heart failure: Secondary | ICD-10-CM | POA: Diagnosis not present

## 2021-08-15 DIAGNOSIS — I69351 Hemiplegia and hemiparesis following cerebral infarction affecting right dominant side: Secondary | ICD-10-CM | POA: Diagnosis not present

## 2021-08-15 DIAGNOSIS — R001 Bradycardia, unspecified: Secondary | ICD-10-CM | POA: Diagnosis not present

## 2021-08-15 DIAGNOSIS — R2689 Other abnormalities of gait and mobility: Secondary | ICD-10-CM | POA: Diagnosis not present

## 2021-08-15 DIAGNOSIS — I63312 Cerebral infarction due to thrombosis of left middle cerebral artery: Secondary | ICD-10-CM | POA: Diagnosis not present

## 2021-08-17 DIAGNOSIS — R2689 Other abnormalities of gait and mobility: Secondary | ICD-10-CM | POA: Diagnosis not present

## 2021-08-17 DIAGNOSIS — I63312 Cerebral infarction due to thrombosis of left middle cerebral artery: Secondary | ICD-10-CM | POA: Diagnosis not present

## 2021-08-17 DIAGNOSIS — I5032 Chronic diastolic (congestive) heart failure: Secondary | ICD-10-CM | POA: Diagnosis not present

## 2021-08-17 DIAGNOSIS — J9601 Acute respiratory failure with hypoxia: Secondary | ICD-10-CM | POA: Diagnosis not present

## 2021-08-17 DIAGNOSIS — I69351 Hemiplegia and hemiparesis following cerebral infarction affecting right dominant side: Secondary | ICD-10-CM | POA: Diagnosis not present

## 2021-08-20 ENCOUNTER — Encounter: Payer: Self-pay | Admitting: Adult Health

## 2021-08-20 ENCOUNTER — Non-Acute Institutional Stay (SKILLED_NURSING_FACILITY): Payer: Medicare Other | Admitting: Adult Health

## 2021-08-20 DIAGNOSIS — J9601 Acute respiratory failure with hypoxia: Secondary | ICD-10-CM | POA: Diagnosis not present

## 2021-08-20 DIAGNOSIS — R482 Apraxia: Secondary | ICD-10-CM | POA: Diagnosis not present

## 2021-08-20 DIAGNOSIS — R2689 Other abnormalities of gait and mobility: Secondary | ICD-10-CM | POA: Diagnosis not present

## 2021-08-20 DIAGNOSIS — I69351 Hemiplegia and hemiparesis following cerebral infarction affecting right dominant side: Secondary | ICD-10-CM | POA: Diagnosis not present

## 2021-08-20 DIAGNOSIS — R051 Acute cough: Secondary | ICD-10-CM | POA: Diagnosis not present

## 2021-08-20 DIAGNOSIS — J9611 Chronic respiratory failure with hypoxia: Secondary | ICD-10-CM | POA: Diagnosis not present

## 2021-08-20 DIAGNOSIS — J439 Emphysema, unspecified: Secondary | ICD-10-CM

## 2021-08-20 DIAGNOSIS — M62561 Muscle wasting and atrophy, not elsewhere classified, right lower leg: Secondary | ICD-10-CM | POA: Diagnosis not present

## 2021-08-20 DIAGNOSIS — R278 Other lack of coordination: Secondary | ICD-10-CM | POA: Diagnosis not present

## 2021-08-20 DIAGNOSIS — R293 Abnormal posture: Secondary | ICD-10-CM | POA: Diagnosis not present

## 2021-08-20 DIAGNOSIS — I69359 Hemiplegia and hemiparesis following cerebral infarction affecting unspecified side: Secondary | ICD-10-CM

## 2021-08-20 DIAGNOSIS — I5032 Chronic diastolic (congestive) heart failure: Secondary | ICD-10-CM | POA: Diagnosis not present

## 2021-08-20 DIAGNOSIS — K219 Gastro-esophageal reflux disease without esophagitis: Secondary | ICD-10-CM | POA: Diagnosis not present

## 2021-08-20 DIAGNOSIS — I63312 Cerebral infarction due to thrombosis of left middle cerebral artery: Secondary | ICD-10-CM | POA: Diagnosis not present

## 2021-08-20 DIAGNOSIS — Z931 Gastrostomy status: Secondary | ICD-10-CM

## 2021-08-20 DIAGNOSIS — M6389 Disorders of muscle in diseases classified elsewhere, multiple sites: Secondary | ICD-10-CM | POA: Diagnosis not present

## 2021-08-20 DIAGNOSIS — I480 Paroxysmal atrial fibrillation: Secondary | ICD-10-CM | POA: Diagnosis not present

## 2021-08-20 DIAGNOSIS — R471 Dysarthria and anarthria: Secondary | ICD-10-CM | POA: Diagnosis not present

## 2021-08-20 DIAGNOSIS — R001 Bradycardia, unspecified: Secondary | ICD-10-CM | POA: Diagnosis not present

## 2021-08-20 DIAGNOSIS — I6602 Occlusion and stenosis of left middle cerebral artery: Secondary | ICD-10-CM | POA: Diagnosis not present

## 2021-08-20 NOTE — Progress Notes (Signed)
?Location:  [facility] ?  ?Place of Service:  SNF (31) ?Provider:   ?Cindi Carbon, ANP ?[place] ?[phone] ? ? ?Virgie Dad, MD ? ?Patient Care Team: ?Virgie Dad, MD as PCP - General (Internal Medicine) ?Jerline Pain, MD as PCP - Cardiology (Cardiology) ?Deboraha Sprang, MD as PCP - Electrophysiology (Cardiology) ?Jovita Gamma, MD as Consulting Physician (Neurosurgery) ? ?Extended Emergency Contact Information ?Primary Emergency Contact: Matton,[name] ?Address: [address] ?         [place] ?Home Phone: [phone] ?Mobile Phone: [phone] ?Relation: Spouse ?Secondary Emergency Contact: Burnham,[name] ?Address: [address] ?         [place], [place] [address] [place] ?Mobile Phone: [phone] ?Relation: Son ? ?Code Status:  DNR ?Goals of care: Advanced Directive information ? ?  07/31/2021  ?  1:05 PM  ?Advanced Directives  ?Does Patient Have a Medical Advance Directive? Yes  ?Type of Advance Directive Living will  ?Does patient want to make changes to medical advance directive? No - Patient declined  ? ? ? ?Chief Complaint  ?Patient presents with  ? Medical Management of Chronic Issues  ? ? ?HPI:  ?Pt is a 76 y.o. female seen today for medical management of chronic diseases.   ?PMH significant for COPD, HTN, obstructive cardiomyopathy, PAD s/p fem pop 2016, GERD, bells palsy anxiety, afib, syncope, dysphagia, dysarthria ? ?COPD: severe, former smoker. Reports a dry cough 3-4 weeks. Has some DOE that is unchanged. No sputum production. Remains on 2L [place] oxygen sats 90 or greater. She is on spiriva started in March.  Did not like nebulizers.  ? ?GERD: prior hx of severe indigestion resolved with protonix. She is taking it bid. No current symptoms.  ? 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  ?Has a feeding tube not be  using for food. Weight is up by 2 lbs at 101 Denies any issues chewing or swallowing. Tube is being used for flushes bid. Alert and oriented. Able to drink fluids. Husband at bedside.  ? ?She saw Dr Caryl Comes due to hx of afib and hx of bradycardia. HR has been 50-60s here at [facility]. She is off betablocker. Still on amiodarone. The lasix was supposed to be prn per her husband.  ? ? ? ?Past Medical History:  ?Diagnosis Date  ? Anxiety   ? Arthritis   ? "some in my lower back; probably elbows, knees" (11/18/2017)  ? Atrial fibrillation ([place])   ? Bell's palsy   ? when pt. was 76 yrs old, when under stress the left side of face will droop.  ? Complication of anesthesia   ? "vascular OR 2016; BP bottomed out; couldn't get it regulated; ended up in ICU for DAYS" (11/18/2017)  ? GERD (gastroesophageal reflux disease)   ? History of kidney stones   ? Hypertension   ? Hypertrophic cardiomyopathy ([place])   ? severe LV basilar hypertrophy witn no evidence of significant outflow tract obstruction, EF 65-70%, mild LAE, mild TR, grade 1a diastolic dysfunction [id number] (Dr. Candee Furbish) (Atrial Septal Hypertrophy pattern)-- Intra-op TEE with dsignificant outflow tract obstruction - AI, MR & TR  ? Insomnia   ? Mild aortic sclerosis   ? Osteopenia   ? Peripheral vascular disease ([place])   ? Syncope   ? , Vagal  ? ?Past Surgical History:  ?Procedure Laterality Date  ?  AUGMENTATION MAMMAPLASTY Bilateral   ? BACK SURGERY    ? CARDIAC CATHETERIZATION N/A 05/07/2016  ? Procedure: Left Heart Cath and Coronary Angiography;  Surgeon: Burnell Blanks, MD;  Location: [facility] CV LAB;  Service: Cardiovascular;  Laterality: N/A;  ? CARDIOVERSION N/A 09/24/2017  ? Procedure: CARDIOVERSION;  Surgeon: Larey Dresser, MD;  Location: [facility] ENDOSCOPY;  Service: Cardiovascular;  Laterality: N/A;  ? DILATION AND CURETTAGE OF UTERUS    ? ENDARTERECTOMY FEMORAL Right 03/02/2015  ? Procedure: ENDARTERECTOMY RIGHT FEMORAL;  Surgeon: Angelia Mould,  MD;  Location: [facility];  Service: Vascular;  Laterality: Right;  ? ESOPHAGOGASTRODUODENOSCOPY (EGD) WITH PROPOFOL N/A 12/01/2020  ? Procedure: ESOPHAGOGASTRODUODENOSCOPY (EGD) WITH PROPOFOL;  Surgeon: Jesusita Oka, MD;  Location: [facility] ENDOSCOPY;  Service: General;  Laterality: N/A;  ? FACIAL COSMETIC SURGERY Left 2002  ? "related to [place] @ age 89; left eye/side of face droopy; tried to make area symmetrical"  ? FEMORAL-POPLITEAL BYPASS GRAFT Right 03/02/2015  ? Procedure: BYPASS GRAFT FEMORAL-BELOW KNEE POPLITEAL ARTERY;  Surgeon: Angelia Mould, MD;  Location: [facility];  Service: Vascular;  Laterality: Right;  ? INGUINAL HERNIA REPAIR Bilateral 2002  ? IR ANGIO INTRA EXTRACRAN SEL COM CAROTID INNOMINATE UNI L MOD SED  11/16/2020  ? IR CT HEAD LTD  11/13/2020  ? IR PERCUTANEOUS ART THROMBECTOMY/INFUSION INTRACRANIAL INC DIAG ANGIO  11/13/2020  ? OVARIAN CYST REMOVAL Left   ? PEG PLACEMENT N/A 12/01/2020  ? Procedure: PERCUTANEOUS ENDOSCOPIC GASTROSTOMY (PEG) PLACEMENT;  Surgeon: Jesusita Oka, MD;  Location: [facility] ENDOSCOPY;  Service: General;  Laterality: N/A;  ? PERIPHERAL VASCULAR CATHETERIZATION N/A 01/16/2015  ? Procedure: Abdominal Aortogram;  Surgeon: Angelia Mould, MD;  Location: [facility] CV LAB;  Service: Cardiovascular;  Laterality: N/A;  ? POSTERIOR LUMBAR FUSION  2015  ? "have plates and screws in there"  ? RADIOLOGY WITH ANESTHESIA N/A 11/13/2020  ? Procedure: IR WITH ANESTHESIA;  Surgeon: Radiologist, Medication, MD;  Location: [facility];  Service: Radiology;  Laterality: N/A;  ? TONSILLECTOMY    ? ? ?Allergies  ?Allergen Reactions  ? Amoxicillin Other (See Comments)  ?  UTI ?Has patient had a PCN reaction causing immediate rash, facial/tongue/throat swelling, SOB or lightheadedness with hypotension: No ?Has patient had a PCN reaction causing severe rash involving mucus membranes or skin necrosis: No ?Has patient had a PCN reaction that required hospitalization: No ?Has patient had a PCN  reaction occurring within the last 10 years: Yes--UTI ONLY ?If all of the above answers are "NO", then may proceed with Cephalosporin use. ?  ? Atenolol Cough  ? Crestor [Rosuvastatin Calcium] Other (See Comments)  ?  Muscle aches  ? Pravastatin Other (See Comments)  ?  Muscle aches  ? Sulfa Antibiotics Nausea Only  ? Codeine Other (See Comments)  ?  hallucinations  ? ? ?Outpatient Encounter Medications as of 08/20/2021  ?Medication Sig  ? aluminum-magnesium hydroxide-simethicone (MAALOX) I037812 MG/5ML SUSP Take 30 mLs by mouth every 4 (four) hours as needed (heartburn).  ? amiodarone (PACERONE) 200 MG tablet Take 200 mg by mouth daily. May give crushed  ? amLODipine (NORVASC) 5 MG tablet Take 5 mg by mouth daily.  ? apixaban (ELIQUIS) 5 MG TABS tablet Take 5 mg by mouth 2 (two) times daily. May give crushed  ? bisacodyl (DULCOLAX) 5 MG EC tablet Take 5 mg by mouth 3 (three) times daily as needed for moderate constipation.  ? buPROPion ER (WELLBUTRIN SR) 100 MG 12 hr  tablet Take 100 mg by mouth 2 (two) times daily.  ? ezetimibe (ZETIA) 10 MG tablet Take 10 mg by mouth daily. Oral if crushed  ? furosemide (LASIX) 20 MG tablet Take 20 mg by mouth daily. Give Lasix 20mg  for weight gain equal or greater than 3 pounds  ? hydrALAZINE (APRESOLINE) 10 MG tablet Take 10 mg by mouth 3 (three) times daily as needed (SBP > 165 or DBP > 95).  ? ipratropium-albuterol (DUONEB) 0.5-2.5 (3) MG/3ML SOLN Take 3 mLs by nebulization every 4 (four) hours as needed.  ? nystatin cream (MYCOSTATIN) Apply 1 application topically 2 (two) times daily as needed for dry skin (apply to perirectal rash).  ? OXYGEN Inhale 3 L into the lungs as directed. to maintain sats > or equal to 90%. May titrate ?Three Times A Day; 07:00 AM - 03:00 PM, 03:00 PM - 11:00 PM, 11:00 PM - 7:00 am  ? pantoprazole (PROTONIX) 40 MG tablet Take 40 mg by mouth 2 (two) times daily.  ? Polyethyl Glycol-Propyl Glycol (SYSTANE) 0.4-0.3 % SOLN Place 2 drops into both eyes  4 (four) times daily as needed (irritation).  ? tiotropium (SPIRIVA HANDIHALER) 18 MCG inhalation capsule Place 1 capsule (18 mcg total) into inhaler and inhale daily.  ? Water For Irrigation, Sterile (FREE [employer]

## 2021-08-21 DIAGNOSIS — J9601 Acute respiratory failure with hypoxia: Secondary | ICD-10-CM | POA: Diagnosis not present

## 2021-08-21 DIAGNOSIS — I63312 Cerebral infarction due to thrombosis of left middle cerebral artery: Secondary | ICD-10-CM | POA: Diagnosis not present

## 2021-08-21 DIAGNOSIS — I5032 Chronic diastolic (congestive) heart failure: Secondary | ICD-10-CM | POA: Diagnosis not present

## 2021-08-21 DIAGNOSIS — R2689 Other abnormalities of gait and mobility: Secondary | ICD-10-CM | POA: Diagnosis not present

## 2021-08-21 DIAGNOSIS — I69351 Hemiplegia and hemiparesis following cerebral infarction affecting right dominant side: Secondary | ICD-10-CM | POA: Diagnosis not present

## 2021-08-22 DIAGNOSIS — I480 Paroxysmal atrial fibrillation: Secondary | ICD-10-CM | POA: Diagnosis not present

## 2021-08-22 DIAGNOSIS — R2689 Other abnormalities of gait and mobility: Secondary | ICD-10-CM | POA: Diagnosis not present

## 2021-08-22 DIAGNOSIS — R471 Dysarthria and anarthria: Secondary | ICD-10-CM | POA: Diagnosis not present

## 2021-08-22 DIAGNOSIS — M6389 Disorders of muscle in diseases classified elsewhere, multiple sites: Secondary | ICD-10-CM | POA: Diagnosis not present

## 2021-08-22 DIAGNOSIS — I5032 Chronic diastolic (congestive) heart failure: Secondary | ICD-10-CM | POA: Diagnosis not present

## 2021-08-22 DIAGNOSIS — I63312 Cerebral infarction due to thrombosis of left middle cerebral artery: Secondary | ICD-10-CM | POA: Diagnosis not present

## 2021-08-22 DIAGNOSIS — J9601 Acute respiratory failure with hypoxia: Secondary | ICD-10-CM | POA: Diagnosis not present

## 2021-08-22 DIAGNOSIS — R278 Other lack of coordination: Secondary | ICD-10-CM | POA: Diagnosis not present

## 2021-08-22 DIAGNOSIS — R001 Bradycardia, unspecified: Secondary | ICD-10-CM | POA: Diagnosis not present

## 2021-08-22 DIAGNOSIS — I6602 Occlusion and stenosis of left middle cerebral artery: Secondary | ICD-10-CM | POA: Diagnosis not present

## 2021-08-22 DIAGNOSIS — I69351 Hemiplegia and hemiparesis following cerebral infarction affecting right dominant side: Secondary | ICD-10-CM | POA: Diagnosis not present

## 2021-08-22 DIAGNOSIS — R482 Apraxia: Secondary | ICD-10-CM | POA: Diagnosis not present

## 2021-08-24 DIAGNOSIS — R278 Other lack of coordination: Secondary | ICD-10-CM | POA: Diagnosis not present

## 2021-08-24 DIAGNOSIS — I5032 Chronic diastolic (congestive) heart failure: Secondary | ICD-10-CM | POA: Diagnosis not present

## 2021-08-24 DIAGNOSIS — J9601 Acute respiratory failure with hypoxia: Secondary | ICD-10-CM | POA: Diagnosis not present

## 2021-08-24 DIAGNOSIS — I69351 Hemiplegia and hemiparesis following cerebral infarction affecting right dominant side: Secondary | ICD-10-CM | POA: Diagnosis not present

## 2021-08-24 DIAGNOSIS — R482 Apraxia: Secondary | ICD-10-CM | POA: Diagnosis not present

## 2021-08-24 DIAGNOSIS — R471 Dysarthria and anarthria: Secondary | ICD-10-CM | POA: Diagnosis not present

## 2021-08-24 DIAGNOSIS — R2689 Other abnormalities of gait and mobility: Secondary | ICD-10-CM | POA: Diagnosis not present

## 2021-08-24 DIAGNOSIS — I63312 Cerebral infarction due to thrombosis of left middle cerebral artery: Secondary | ICD-10-CM | POA: Diagnosis not present

## 2021-08-24 DIAGNOSIS — I6602 Occlusion and stenosis of left middle cerebral artery: Secondary | ICD-10-CM | POA: Diagnosis not present

## 2021-08-24 DIAGNOSIS — I480 Paroxysmal atrial fibrillation: Secondary | ICD-10-CM | POA: Diagnosis not present

## 2021-08-24 DIAGNOSIS — R001 Bradycardia, unspecified: Secondary | ICD-10-CM | POA: Diagnosis not present

## 2021-08-24 DIAGNOSIS — M6389 Disorders of muscle in diseases classified elsewhere, multiple sites: Secondary | ICD-10-CM | POA: Diagnosis not present

## 2021-08-28 DIAGNOSIS — I5032 Chronic diastolic (congestive) heart failure: Secondary | ICD-10-CM | POA: Diagnosis not present

## 2021-08-28 DIAGNOSIS — I69351 Hemiplegia and hemiparesis following cerebral infarction affecting right dominant side: Secondary | ICD-10-CM | POA: Diagnosis not present

## 2021-08-28 DIAGNOSIS — J9601 Acute respiratory failure with hypoxia: Secondary | ICD-10-CM | POA: Diagnosis not present

## 2021-08-28 DIAGNOSIS — R278 Other lack of coordination: Secondary | ICD-10-CM | POA: Diagnosis not present

## 2021-08-28 DIAGNOSIS — I63312 Cerebral infarction due to thrombosis of left middle cerebral artery: Secondary | ICD-10-CM | POA: Diagnosis not present

## 2021-08-28 DIAGNOSIS — R2689 Other abnormalities of gait and mobility: Secondary | ICD-10-CM | POA: Diagnosis not present

## 2021-08-28 DIAGNOSIS — M62561 Muscle wasting and atrophy, not elsewhere classified, right lower leg: Secondary | ICD-10-CM | POA: Diagnosis not present

## 2021-08-29 DIAGNOSIS — M6389 Disorders of muscle in diseases classified elsewhere, multiple sites: Secondary | ICD-10-CM | POA: Diagnosis not present

## 2021-08-29 DIAGNOSIS — I69351 Hemiplegia and hemiparesis following cerebral infarction affecting right dominant side: Secondary | ICD-10-CM | POA: Diagnosis not present

## 2021-08-29 DIAGNOSIS — R293 Abnormal posture: Secondary | ICD-10-CM | POA: Diagnosis not present

## 2021-08-29 DIAGNOSIS — R2689 Other abnormalities of gait and mobility: Secondary | ICD-10-CM | POA: Diagnosis not present

## 2021-08-29 DIAGNOSIS — I63312 Cerebral infarction due to thrombosis of left middle cerebral artery: Secondary | ICD-10-CM | POA: Diagnosis not present

## 2021-08-29 DIAGNOSIS — I6602 Occlusion and stenosis of left middle cerebral artery: Secondary | ICD-10-CM | POA: Diagnosis not present

## 2021-08-29 DIAGNOSIS — R471 Dysarthria and anarthria: Secondary | ICD-10-CM | POA: Diagnosis not present

## 2021-08-29 DIAGNOSIS — R482 Apraxia: Secondary | ICD-10-CM | POA: Diagnosis not present

## 2021-08-29 DIAGNOSIS — I5032 Chronic diastolic (congestive) heart failure: Secondary | ICD-10-CM | POA: Diagnosis not present

## 2021-08-29 DIAGNOSIS — I480 Paroxysmal atrial fibrillation: Secondary | ICD-10-CM | POA: Diagnosis not present

## 2021-08-29 DIAGNOSIS — R278 Other lack of coordination: Secondary | ICD-10-CM | POA: Diagnosis not present

## 2021-08-29 DIAGNOSIS — R001 Bradycardia, unspecified: Secondary | ICD-10-CM | POA: Diagnosis not present

## 2021-08-29 DIAGNOSIS — J9601 Acute respiratory failure with hypoxia: Secondary | ICD-10-CM | POA: Diagnosis not present

## 2021-08-31 DIAGNOSIS — J9601 Acute respiratory failure with hypoxia: Secondary | ICD-10-CM | POA: Diagnosis not present

## 2021-08-31 DIAGNOSIS — I480 Paroxysmal atrial fibrillation: Secondary | ICD-10-CM | POA: Diagnosis not present

## 2021-08-31 DIAGNOSIS — R278 Other lack of coordination: Secondary | ICD-10-CM | POA: Diagnosis not present

## 2021-08-31 DIAGNOSIS — R001 Bradycardia, unspecified: Secondary | ICD-10-CM | POA: Diagnosis not present

## 2021-08-31 DIAGNOSIS — M6389 Disorders of muscle in diseases classified elsewhere, multiple sites: Secondary | ICD-10-CM | POA: Diagnosis not present

## 2021-08-31 DIAGNOSIS — R2689 Other abnormalities of gait and mobility: Secondary | ICD-10-CM | POA: Diagnosis not present

## 2021-08-31 DIAGNOSIS — I6602 Occlusion and stenosis of left middle cerebral artery: Secondary | ICD-10-CM | POA: Diagnosis not present

## 2021-08-31 DIAGNOSIS — I69351 Hemiplegia and hemiparesis following cerebral infarction affecting right dominant side: Secondary | ICD-10-CM | POA: Diagnosis not present

## 2021-08-31 DIAGNOSIS — R482 Apraxia: Secondary | ICD-10-CM | POA: Diagnosis not present

## 2021-08-31 DIAGNOSIS — I5032 Chronic diastolic (congestive) heart failure: Secondary | ICD-10-CM | POA: Diagnosis not present

## 2021-08-31 DIAGNOSIS — R471 Dysarthria and anarthria: Secondary | ICD-10-CM | POA: Diagnosis not present

## 2021-08-31 DIAGNOSIS — I63312 Cerebral infarction due to thrombosis of left middle cerebral artery: Secondary | ICD-10-CM | POA: Diagnosis not present

## 2021-09-03 DIAGNOSIS — J9601 Acute respiratory failure with hypoxia: Secondary | ICD-10-CM | POA: Diagnosis not present

## 2021-09-03 DIAGNOSIS — I63312 Cerebral infarction due to thrombosis of left middle cerebral artery: Secondary | ICD-10-CM | POA: Diagnosis not present

## 2021-09-03 DIAGNOSIS — I69351 Hemiplegia and hemiparesis following cerebral infarction affecting right dominant side: Secondary | ICD-10-CM | POA: Diagnosis not present

## 2021-09-03 DIAGNOSIS — R278 Other lack of coordination: Secondary | ICD-10-CM | POA: Diagnosis not present

## 2021-09-03 DIAGNOSIS — I5032 Chronic diastolic (congestive) heart failure: Secondary | ICD-10-CM | POA: Diagnosis not present

## 2021-09-03 DIAGNOSIS — M62561 Muscle wasting and atrophy, not elsewhere classified, right lower leg: Secondary | ICD-10-CM | POA: Diagnosis not present

## 2021-09-03 DIAGNOSIS — R2689 Other abnormalities of gait and mobility: Secondary | ICD-10-CM | POA: Diagnosis not present

## 2021-09-05 DIAGNOSIS — I6602 Occlusion and stenosis of left middle cerebral artery: Secondary | ICD-10-CM | POA: Diagnosis not present

## 2021-09-05 DIAGNOSIS — I69351 Hemiplegia and hemiparesis following cerebral infarction affecting right dominant side: Secondary | ICD-10-CM | POA: Diagnosis not present

## 2021-09-05 DIAGNOSIS — J9601 Acute respiratory failure with hypoxia: Secondary | ICD-10-CM | POA: Diagnosis not present

## 2021-09-05 DIAGNOSIS — R278 Other lack of coordination: Secondary | ICD-10-CM | POA: Diagnosis not present

## 2021-09-05 DIAGNOSIS — R471 Dysarthria and anarthria: Secondary | ICD-10-CM | POA: Diagnosis not present

## 2021-09-05 DIAGNOSIS — M6389 Disorders of muscle in diseases classified elsewhere, multiple sites: Secondary | ICD-10-CM | POA: Diagnosis not present

## 2021-09-05 DIAGNOSIS — R293 Abnormal posture: Secondary | ICD-10-CM | POA: Diagnosis not present

## 2021-09-05 DIAGNOSIS — R001 Bradycardia, unspecified: Secondary | ICD-10-CM | POA: Diagnosis not present

## 2021-09-05 DIAGNOSIS — R482 Apraxia: Secondary | ICD-10-CM | POA: Diagnosis not present

## 2021-09-05 DIAGNOSIS — I480 Paroxysmal atrial fibrillation: Secondary | ICD-10-CM | POA: Diagnosis not present

## 2021-09-05 DIAGNOSIS — R2689 Other abnormalities of gait and mobility: Secondary | ICD-10-CM | POA: Diagnosis not present

## 2021-09-05 DIAGNOSIS — I63312 Cerebral infarction due to thrombosis of left middle cerebral artery: Secondary | ICD-10-CM | POA: Diagnosis not present

## 2021-09-05 DIAGNOSIS — I5032 Chronic diastolic (congestive) heart failure: Secondary | ICD-10-CM | POA: Diagnosis not present

## 2021-09-06 ENCOUNTER — Telehealth: Payer: Self-pay | Admitting: Cardiology

## 2021-09-06 NOTE — Telephone Encounter (Signed)
Misty from [facility] is faxing over the pt's recent HR and BP readings to the main fax #. She is requesting they be reviewed and they receive a callback to discuss. Misty is leaving for the day, so you can speak with whomever answers when calling back.

## 2021-09-07 DIAGNOSIS — J9601 Acute respiratory failure with hypoxia: Secondary | ICD-10-CM | POA: Diagnosis not present

## 2021-09-07 DIAGNOSIS — I6602 Occlusion and stenosis of left middle cerebral artery: Secondary | ICD-10-CM | POA: Diagnosis not present

## 2021-09-07 DIAGNOSIS — I5032 Chronic diastolic (congestive) heart failure: Secondary | ICD-10-CM | POA: Diagnosis not present

## 2021-09-07 DIAGNOSIS — R2689 Other abnormalities of gait and mobility: Secondary | ICD-10-CM | POA: Diagnosis not present

## 2021-09-07 DIAGNOSIS — I480 Paroxysmal atrial fibrillation: Secondary | ICD-10-CM | POA: Diagnosis not present

## 2021-09-07 DIAGNOSIS — I69351 Hemiplegia and hemiparesis following cerebral infarction affecting right dominant side: Secondary | ICD-10-CM | POA: Diagnosis not present

## 2021-09-07 DIAGNOSIS — R482 Apraxia: Secondary | ICD-10-CM | POA: Diagnosis not present

## 2021-09-07 DIAGNOSIS — I63312 Cerebral infarction due to thrombosis of left middle cerebral artery: Secondary | ICD-10-CM | POA: Diagnosis not present

## 2021-09-07 DIAGNOSIS — R471 Dysarthria and anarthria: Secondary | ICD-10-CM | POA: Diagnosis not present

## 2021-09-07 NOTE — Telephone Encounter (Signed)
Follow Up:    Lanice Schwab is calling to see if fax was received and reviewed.

## 2021-09-07 NOTE — Telephone Encounter (Signed)
[facility] informed that we have not yet received fax of BP and HR.  Asked if pt BP and HR are abnormal.  She reports pt HR in the 50's.  Pt has some fatigue but this is normal but nothing abnormal for pt.  Gave Misty our fax number to resend readings.

## 2021-09-10 DIAGNOSIS — R278 Other lack of coordination: Secondary | ICD-10-CM | POA: Diagnosis not present

## 2021-09-10 DIAGNOSIS — I5032 Chronic diastolic (congestive) heart failure: Secondary | ICD-10-CM | POA: Diagnosis not present

## 2021-09-10 DIAGNOSIS — M62561 Muscle wasting and atrophy, not elsewhere classified, right lower leg: Secondary | ICD-10-CM | POA: Diagnosis not present

## 2021-09-10 DIAGNOSIS — I69351 Hemiplegia and hemiparesis following cerebral infarction affecting right dominant side: Secondary | ICD-10-CM | POA: Diagnosis not present

## 2021-09-10 DIAGNOSIS — J9601 Acute respiratory failure with hypoxia: Secondary | ICD-10-CM | POA: Diagnosis not present

## 2021-09-10 DIAGNOSIS — I63312 Cerebral infarction due to thrombosis of left middle cerebral artery: Secondary | ICD-10-CM | POA: Diagnosis not present

## 2021-09-10 DIAGNOSIS — R2689 Other abnormalities of gait and mobility: Secondary | ICD-10-CM | POA: Diagnosis not present

## 2021-09-11 DIAGNOSIS — J9601 Acute respiratory failure with hypoxia: Secondary | ICD-10-CM | POA: Diagnosis not present

## 2021-09-11 DIAGNOSIS — R2689 Other abnormalities of gait and mobility: Secondary | ICD-10-CM | POA: Diagnosis not present

## 2021-09-11 DIAGNOSIS — I5032 Chronic diastolic (congestive) heart failure: Secondary | ICD-10-CM | POA: Diagnosis not present

## 2021-09-11 DIAGNOSIS — I69351 Hemiplegia and hemiparesis following cerebral infarction affecting right dominant side: Secondary | ICD-10-CM | POA: Diagnosis not present

## 2021-09-11 DIAGNOSIS — I63312 Cerebral infarction due to thrombosis of left middle cerebral artery: Secondary | ICD-10-CM | POA: Diagnosis not present

## 2021-09-11 NOTE — Telephone Encounter (Signed)
Called and spoke with [facility] who is aware to continue to monitor.  No new orders at this time.

## 2021-09-11 NOTE — Telephone Encounter (Signed)
Dr Anne Fu has reviewed VS from [facility].  No new orders given at this time.

## 2021-09-12 DIAGNOSIS — I5032 Chronic diastolic (congestive) heart failure: Secondary | ICD-10-CM | POA: Diagnosis not present

## 2021-09-12 DIAGNOSIS — I69351 Hemiplegia and hemiparesis following cerebral infarction affecting right dominant side: Secondary | ICD-10-CM | POA: Diagnosis not present

## 2021-09-12 DIAGNOSIS — R482 Apraxia: Secondary | ICD-10-CM | POA: Diagnosis not present

## 2021-09-12 DIAGNOSIS — R2689 Other abnormalities of gait and mobility: Secondary | ICD-10-CM | POA: Diagnosis not present

## 2021-09-12 DIAGNOSIS — R471 Dysarthria and anarthria: Secondary | ICD-10-CM | POA: Diagnosis not present

## 2021-09-12 DIAGNOSIS — J9601 Acute respiratory failure with hypoxia: Secondary | ICD-10-CM | POA: Diagnosis not present

## 2021-09-12 DIAGNOSIS — I6602 Occlusion and stenosis of left middle cerebral artery: Secondary | ICD-10-CM | POA: Diagnosis not present

## 2021-09-12 DIAGNOSIS — I480 Paroxysmal atrial fibrillation: Secondary | ICD-10-CM | POA: Diagnosis not present

## 2021-09-12 DIAGNOSIS — I63312 Cerebral infarction due to thrombosis of left middle cerebral artery: Secondary | ICD-10-CM | POA: Diagnosis not present

## 2021-09-14 DIAGNOSIS — J9601 Acute respiratory failure with hypoxia: Secondary | ICD-10-CM | POA: Diagnosis not present

## 2021-09-14 DIAGNOSIS — M6389 Disorders of muscle in diseases classified elsewhere, multiple sites: Secondary | ICD-10-CM | POA: Diagnosis not present

## 2021-09-14 DIAGNOSIS — R2689 Other abnormalities of gait and mobility: Secondary | ICD-10-CM | POA: Diagnosis not present

## 2021-09-14 DIAGNOSIS — R278 Other lack of coordination: Secondary | ICD-10-CM | POA: Diagnosis not present

## 2021-09-14 DIAGNOSIS — M62561 Muscle wasting and atrophy, not elsewhere classified, right lower leg: Secondary | ICD-10-CM | POA: Diagnosis not present

## 2021-09-14 DIAGNOSIS — I63312 Cerebral infarction due to thrombosis of left middle cerebral artery: Secondary | ICD-10-CM | POA: Diagnosis not present

## 2021-09-14 DIAGNOSIS — R293 Abnormal posture: Secondary | ICD-10-CM | POA: Diagnosis not present

## 2021-09-14 DIAGNOSIS — I5032 Chronic diastolic (congestive) heart failure: Secondary | ICD-10-CM | POA: Diagnosis not present

## 2021-09-14 DIAGNOSIS — R001 Bradycardia, unspecified: Secondary | ICD-10-CM | POA: Diagnosis not present

## 2021-09-14 DIAGNOSIS — I69351 Hemiplegia and hemiparesis following cerebral infarction affecting right dominant side: Secondary | ICD-10-CM | POA: Diagnosis not present

## 2021-09-17 ENCOUNTER — Non-Acute Institutional Stay (SKILLED_NURSING_FACILITY): Payer: Medicare Other | Admitting: Internal Medicine

## 2021-09-17 ENCOUNTER — Encounter: Payer: Self-pay | Admitting: Internal Medicine

## 2021-09-17 DIAGNOSIS — E782 Mixed hyperlipidemia: Secondary | ICD-10-CM | POA: Diagnosis not present

## 2021-09-17 DIAGNOSIS — I10 Essential (primary) hypertension: Secondary | ICD-10-CM

## 2021-09-17 DIAGNOSIS — I421 Obstructive hypertrophic cardiomyopathy: Secondary | ICD-10-CM

## 2021-09-17 DIAGNOSIS — J441 Chronic obstructive pulmonary disease with (acute) exacerbation: Secondary | ICD-10-CM

## 2021-09-17 DIAGNOSIS — J439 Emphysema, unspecified: Secondary | ICD-10-CM | POA: Diagnosis not present

## 2021-09-17 DIAGNOSIS — K219 Gastro-esophageal reflux disease without esophagitis: Secondary | ICD-10-CM

## 2021-09-17 DIAGNOSIS — I69359 Hemiplegia and hemiparesis following cerebral infarction affecting unspecified side: Secondary | ICD-10-CM | POA: Diagnosis not present

## 2021-09-17 DIAGNOSIS — R001 Bradycardia, unspecified: Secondary | ICD-10-CM | POA: Diagnosis not present

## 2021-09-17 DIAGNOSIS — Z931 Gastrostomy status: Secondary | ICD-10-CM | POA: Diagnosis not present

## 2021-09-17 DIAGNOSIS — I480 Paroxysmal atrial fibrillation: Secondary | ICD-10-CM

## 2021-09-17 DIAGNOSIS — F339 Major depressive disorder, recurrent, unspecified: Secondary | ICD-10-CM

## 2021-09-17 NOTE — Progress Notes (Signed)
Location:   [facility] Room Number: [place] of Service:  SNF [phone]) Provider:  Veleta Miners MD  Virgie Dad, MD  Patient Care Team: Virgie Dad, MD as PCP - General (Internal Medicine) Jerline Pain, MD as PCP - Cardiology (Cardiology) Deboraha Sprang, MD as PCP - Electrophysiology (Cardiology) Jovita Gamma, MD as Consulting Physician (Neurosurgery)  Extended Emergency Contact Information Primary Emergency Contact: Doscher,[name] Address: [place] Phone: [phone] Mobile Phone: [phone] Relation: Spouse Secondary Emergency Contact: Luberto,[name] Address: [address]          [place], [place] [address] [place] Mobile Phone: [phone] Relation: Son  Code Status:  Full Code Goals of care: Advanced Directive information    09/17/2021   10:12 AM  Advanced Directives  Does Patient Have a Medical Advance Directive? Yes  Type of Advance Directive Living will  Does patient want to make changes to medical advance directive? No - Patient declined     Chief Complaint  Patient presents with  . Acute Visit    Cough/ Possible G tube removal    HPI:  Pt is a 76 y.o. female seen today for an acute visit for Cough with SOB and Possible removal to G tube  She is LT resident of [facility]   Admitted from 03/24 -03/27 for Bradycardia most likely due to Metoprolol Patient with h/o A Fib and HLD HOCM, PAD, anxiety and GERD    Acute Left MCA stroke with Right sided weakness in 08/22 She received In patient Rehab from 9/8 -01/15/21 Increased Diffusion in Left Basal Ganglia internal and external capsules extending Caudate body in 10/22  Patient has been having Cough and SOB since Sat Cough is productive No Fever or Chest pain  Needing 3l of Oxygen to keep sats up  She is not using her G tube and husband want to know if I can remove  it She is eating well and able to keep her Nutrition and Her weight is stable Wt Readings from Last 3 Encounters:  09/17/21 103 lb 3.2 oz (46.8 kg)  08/20/21 101 lb (45.8 kg)  08/03/21 101 lb 9.6 oz (46.1 kg)     Past Medical History:  Diagnosis Date  . Anxiety   . Arthritis    "some in my lower back; probably elbows, knees" (11/18/2017)  . Atrial fibrillation ([place])   . Bell's palsy    when pt. was 76 yrs old, when under stress the left side of face will droop.  . Complication of anesthesia    "vascular OR 2016; BP bottomed out; couldn't get it regulated; ended up in ICU for DAYS" (11/18/2017)  . GERD (gastroesophageal reflux disease)   . History of kidney stones   . Hypertension   . Hypertrophic cardiomyopathy ([facility])    severe LV basilar hypertrophy witn no evidence of significant outflow tract obstruction, EF 65-70%, mild LAE, mild TR, grade 1a diastolic dysfunction [id number] (Dr. Candee Furbish) (Atrial Septal Hypertrophy pattern)-- Intra-op TEE with dsignificant outflow tract obstruction - AI, MR & TR  . Insomnia   . Mild aortic sclerosis   . Osteopenia   . Peripheral vascular disease ([place])   . Syncope    , Vagal   Past Surgical History:  Procedure Laterality Date  . AUGMENTATION MAMMAPLASTY Bilateral   . BACK SURGERY    . CARDIAC CATHETERIZATION  N/A 05/07/2016   Procedure: Left Heart Cath and Coronary Angiography;  Surgeon: Burnell Blanks, MD;  Location: [facility] CV LAB;  Service: Cardiovascular;  Laterality: N/A;  . CARDIOVERSION N/A 09/24/2017   Procedure: CARDIOVERSION;  Surgeon: Larey Dresser, MD;  Location: [facility] ENDOSCOPY;  Service: Cardiovascular;  Laterality: N/A;  . DILATION AND CURETTAGE OF UTERUS    . ENDARTERECTOMY FEMORAL Right 03/02/2015   Procedure: ENDARTERECTOMY RIGHT FEMORAL;  Surgeon: Angelia Mould, MD;  [facility] OR;  Service: Vascular;  Laterality: Right;  . ESOPHAGOGASTRODUODENOSCOPY (EGD) WITH PROPOFOL N/A 12/01/2020   Procedure:  ESOPHAGOGASTRODUODENOSCOPY (EGD) WITH PROPOFOL;  Surgeon: Jesusita Oka, MD;  Location: [facility] ENDOSCOPY;  Service: General;  Laterality: N/A;  . FACIAL COSMETIC SURGERY Left 2002   "related to Bell's Palsy @ age 76; left eye/side of face droopy; tried to make area symmetrical"  . FEMORAL-POPLITEAL BYPASS GRAFT Right 03/02/2015   Procedure: BYPASS GRAFT FEMORAL-BELOW KNEE POPLITEAL ARTERY;  Surgeon: Angelia Mould, MD;  Location: [facility];  Service: Vascular;  Laterality: Right;  . INGUINAL HERNIA REPAIR Bilateral 2002  . IR ANGIO INTRA EXTRACRAN SEL COM CAROTID INNOMINATE UNI L MOD SED  11/16/2020  . IR CT HEAD LTD  11/13/2020  . IR PERCUTANEOUS ART THROMBECTOMY/INFUSION INTRACRANIAL INC DIAG ANGIO  11/13/2020  . OVARIAN CYST REMOVAL Left   . PEG PLACEMENT N/A 12/01/2020   Procedure: PERCUTANEOUS ENDOSCOPIC GASTROSTOMY (PEG) PLACEMENT;  Surgeon: Jesusita Oka, MD;  Location: [facility] ENDOSCOPY;  Service: General;  Laterality: N/A;  . PERIPHERAL VASCULAR CATHETERIZATION N/A 01/16/2015   Procedure: Abdominal Aortogram;  Surgeon: Angelia Mould, MD;  Location: [facility] CV LAB;  Service: Cardiovascular;  Laterality: N/A;  . POSTERIOR LUMBAR FUSION  2015   "have plates and screws in there"  . RADIOLOGY WITH ANESTHESIA N/A 11/13/2020   Procedure: IR WITH ANESTHESIA;  Surgeon: Radiologist, Medication, MD;  Location: [facility];  Service: Radiology;  Laterality: N/A;  . TONSILLECTOMY      Allergies  Allergen Reactions  . Amoxicillin Other (See Comments)    UTI Has patient had a PCN reaction causing immediate rash, facial/tongue/throat swelling, SOB or lightheadedness with hypotension: No Has patient had a PCN reaction causing severe rash involving mucus membranes or skin necrosis: No Has patient had a PCN reaction that required hospitalization: No Has patient had a PCN reaction occurring within the last 10 years: Yes--UTI ONLY If all of the above answers are "NO", then may proceed with Cephalosporin  use.   . Atenolol Cough  . Crestor [Rosuvastatin Calcium] Other (See Comments)    Muscle aches  . Pravastatin Other (See Comments)    Muscle aches  . Sulfa Antibiotics Nausea Only  . Codeine Other (See Comments)    hallucinations    Allergies as of 09/17/2021       Reactions   Amoxicillin Other (See Comments)   UTI Has patient had a PCN reaction causing immediate rash, facial/tongue/throat swelling, SOB or lightheadedness with hypotension: No Has patient had a PCN reaction causing severe rash involving mucus membranes or skin necrosis: No Has patient had a PCN reaction that required hospitalization: No Has patient had a PCN reaction occurring within the last 10 years: Yes--UTI ONLY If all of the above answers are "NO", then may proceed with Cephalosporin use.   Atenolol Cough   Crestor [rosuvastatin Calcium] Other (See Comments)   Muscle aches   Pravastatin Other (See Comments)   Muscle aches   Sulfa Antibiotics Nausea Only  Codeine Other (See Comments)   hallucinations        Medication List        Accurate as of September 17, 2021 10:16 AM. If you have any questions, ask your nurse or doctor.          aluminum-magnesium hydroxide-simethicone I037812 MG/5ML Susp Commonly known as: MAALOX Take 30 mLs by mouth every 4 (four) hours as needed (heartburn).   amiodarone 200 MG tablet Commonly known as: PACERONE Take 200 mg by mouth daily. May give crushed   amLODipine 5 MG tablet Commonly known as: NORVASC Take 5 mg by mouth daily.   apixaban 5 MG Tabs tablet Commonly known as: ELIQUIS Take 5 mg by mouth 2 (two) times daily. May give crushed   bisacodyl 5 MG EC tablet Commonly known as: DULCOLAX Take 5 mg by mouth 3 (three) times daily as needed for moderate constipation.   buPROPion ER 100 MG 12 hr tablet Commonly known as: WELLBUTRIN SR Take 100 mg by mouth 2 (two) times daily.   Dextromethorphan-guaiFENesin 10-100 MG/5ML liquid Take 5 mLs by mouth every  6 (six) hours as needed.   ezetimibe 10 MG tablet Commonly known as: ZETIA Take 10 mg by mouth daily. Oral if crushed   furosemide 20 MG tablet Commonly known as: LASIX Take 20 mg by mouth daily as needed. Give Lasix 20mg  for weight gain equal or greater than 3 pounds   hydrALAZINE 10 MG tablet Commonly known as: APRESOLINE Take 10 mg by mouth 3 (three) times daily as needed (SBP > 165 or DBP > 95).   ipratropium-albuterol 0.5-2.5 (3) MG/3ML Soln Commonly known as: DUONEB Take 3 mLs by nebulization every 4 (four) hours as needed.   ipratropium-albuterol 0.5-2.5 (3) MG/3ML Soln Commonly known as: DUONEB Inhale 3 mLs into the lungs every 6 (six) hours as needed.   ketoconazole 2 % cream Commonly known as: NIZORAL Apply 1 application. topically daily as needed for irritation.   nystatin cream Commonly known as: MYCOSTATIN Apply 1 application topically 2 (two) times daily as needed for dry skin (apply to perirectal rash).   ondansetron 4 MG tablet Commonly known as: ZOFRAN Take 4 mg by mouth every 6 (six) hours as needed for nausea or vomiting.   OXYGEN Inhale 3 L into the lungs as directed. to maintain sats > or equal to 90%. May titrate Three Times A Day; 07:00 AM - 03:00 PM, 03:00 PM - 11:00 PM, 11:00 PM - 7:00 am   pantoprazole 40 MG tablet Commonly known as: PROTONIX Take 40 mg by mouth daily.   Stiolto Respimat 2.5-2.5 MCG/ACT Aers Generic drug: Tiotropium Bromide-Olodaterol Inhale 2 puffs into the lungs daily.   Systane 0.4-0.3 % Soln Generic drug: Polyethyl Glycol-Propyl Glycol Place 2 drops into both eyes 4 (four) times daily as needed (irritation).   zinc oxide 20 % ointment Apply 1 application. topically 2 (two) times daily as needed for irritation (apply to perirectal area prn - use with nystatin).        Review of Systems  Immunization History  Administered Date(s) Administered  . Influenza Split 01/16/2009, 04/30/2010, 01/26/2016, 02/07/2018,  02/01/2020  . Influenza, High Dose Seasonal PF 02/07/2018, 01/25/2019, 02/28/2021  . Influenza,inj,Quad PF,6+ Mos 03/06/2012, 01/26/2016, 01/10/2017  . Influenza,inj,quad, With Preservative 02/14/2015  . Moderna Covid-19 Vaccine Bivalent Booster 72yrs & up 02/28/2021  . PFIZER(Purple Top)SARS-COV-2 Vaccination 05/06/2019, 05/27/2019, 01/27/2020, 09/18/2020  . Pneumococcal Conjugate-13 03/19/2017  . Pneumococcal Polysaccharide-23 02/14/2015  . Td 02/25/2005  . Tdap  05/19/2013  . Zoster, Live 07/28/2006   Pertinent  Health Maintenance Due  Topic Date Due  . INFLUENZA VACCINE  11/13/2021  . DEXA SCAN  Discontinued      07/07/2021    7:30 AM 07/07/2021    7:20 PM 07/08/2021    9:00 AM 07/08/2021    9:30 PM 07/09/2021    8:00 AM  Fall Risk  Patient Fall Risk Level High fall risk High fall risk High fall risk High fall risk Moderate fall risk   Functional Status Survey:    Vitals:   09/17/21 0930  BP: 106/64  Pulse: 62  Resp: 16  Temp: 97.8 F (36.6 C)  SpO2: 92%  Weight: 103 lb 3.2 oz (46.8 kg)  Height: 5\' 3"  (1.6 m)   Body mass index is 18.28 kg/m. Physical Exam  Labs reviewed: Recent Labs    11/29/20 0128 11/30/20 0826 12/02/20 0216 12/04/20 0052 01/23/21 0259 01/30/21 0000 07/06/21 1605 07/06/21 1612 07/07/21 0800 07/08/21 0717 07/09/21 0317 07/11/21 0000  NA 151*   < > 140   < > 135   < > 138   < > 140 141 141 141  K 4.4   < > 4.2   < > 3.6   < > 4.4   < > 4.3 4.0 3.8 4.1  CL 113*   < > 108   < > 94*   < > 104   < > 107 105 109 103  CO2 30   < > 25   < > 28   < > 30  --  27 28 29  26*  GLUCOSE 140*   < > 181*   < > 85   < > 92   < > 80 87 90  --   BUN 60*   < > 51*   < > 14   < > 30*   < > 26* 22 21 24*  CREATININE 0.75   < > 0.79   < > 0.85   < > 0.98   < > 0.79 0.71 0.73 0.7  CALCIUM 8.9   < > 8.4*   < > 8.7*   < > 8.8*  --  8.7* 9.0 8.7* 9.3  MG 2.3  --  2.2  --  2.0  --  2.1  --   --  1.9 1.9  --   PHOS 3.4  --  3.0  --  4.1  --   --   --   --   --    --   --    < > = values in this interval not displayed.   Recent Labs    01/22/21 0651 01/23/21 0259 03/26/21 0000 07/06/21 1605  AST 39 28 23 33  ALT 38 34 31 45*  ALKPHOS 68 57 67 76  BILITOT 1.3* 1.5*  --  0.7  PROT 6.1* 5.6*  --  6.2*  ALBUMIN 3.4* 3.1* 3.3* 3.2*   Recent Labs    01/22/21 0651 01/22/21 0658 02/02/21 0519 03/26/21 0000 07/06/21 1605 07/06/21 1612 07/08/21 0717 07/09/21 0317 07/11/21 0000  WBC 10.7*   < > 9.3   < > 9.3  --  9.8 9.5 8.3  NEUTROABS 7.7  --  7.2  --  6.5  --   --   --   --   HGB 15.5*   < > 14.4   < > 14.6   < > 14.9 14.3 14.1  HCT 48.4*   < >  45.3   < > 45.2   < > 45.4 43.7 42  MCV 96.4   < > 95.4  --  93.0  --  91.2 90.7  --   PLT 237   < > 286   < > 241  --  235 237 224   < > = values in this interval not displayed.   Lab Results  Component Value Date   TSH 0.632 07/06/2021   Lab Results  Component Value Date   HGBA1C 6.0 (H) 11/13/2020   Lab Results  Component Value Date   CHOL 209 (H) 11/13/2020   HDL 70 11/13/2020   LDLCALC 120 (H) 11/13/2020   TRIG 97 11/13/2020   CHOLHDL 3.0 11/13/2020    Significant Diagnostic Results in last 30 days:  No results found.  Assessment/Plan 1. COPD exacerbation (HCC) Prednsione 40 mg Taper over 10 days Duo Nebs Chest Xray  2. PEG (percutaneous endoscopic gastrostomy) status ([place]) Will remove it next week if Chest Xray is negative for aspiration Has not used it for one month 3. Gastroesophageal reflux disease without esophagitis On Protonix  4. 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 Bradycardia ***  6. Paroxysmal atrial fibrillation (HCC) ***  7. 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HCC) ***  8. Mixed hyperlipidemia ***  9. Essential hypertension BP QD If low consider lowering Norvasc  10. Depression, recurrent ([place]) ***  11. Pulmonary emphysema, unspecified emphysema type ([place]) *** -  Ambulatory referral to Pulmonology    Family/ staff Communication:   Labs/tests ordered:

## 2021-09-18 DIAGNOSIS — Z79899 Other long term (current) drug therapy: Secondary | ICD-10-CM | POA: Diagnosis not present

## 2021-09-18 DIAGNOSIS — R059 Cough, unspecified: Secondary | ICD-10-CM | POA: Diagnosis not present

## 2021-09-18 LAB — BASIC METABOLIC PANEL
BUN: 32 — AB (ref 4–21)
CO2: 27 — AB (ref 13–22)
Chloride: 101 (ref 99–108)
Creatinine: 0.9 (ref 0.5–1.1)
Glucose: 104
Potassium: 5.1 mEq/L (ref 3.5–5.1)
Sodium: 135 — AB (ref 137–147)

## 2021-09-18 LAB — COMPREHENSIVE METABOLIC PANEL: Calcium: 8.8 (ref 8.7–10.7)

## 2021-09-24 DIAGNOSIS — R278 Other lack of coordination: Secondary | ICD-10-CM | POA: Diagnosis not present

## 2021-09-24 DIAGNOSIS — M6389 Disorders of muscle in diseases classified elsewhere, multiple sites: Secondary | ICD-10-CM | POA: Diagnosis not present

## 2021-09-24 DIAGNOSIS — I69351 Hemiplegia and hemiparesis following cerebral infarction affecting right dominant side: Secondary | ICD-10-CM | POA: Diagnosis not present

## 2021-09-24 DIAGNOSIS — I5032 Chronic diastolic (congestive) heart failure: Secondary | ICD-10-CM | POA: Diagnosis not present

## 2021-09-24 DIAGNOSIS — I63312 Cerebral infarction due to thrombosis of left middle cerebral artery: Secondary | ICD-10-CM | POA: Diagnosis not present

## 2021-09-24 DIAGNOSIS — R2689 Other abnormalities of gait and mobility: Secondary | ICD-10-CM | POA: Diagnosis not present

## 2021-09-24 DIAGNOSIS — R001 Bradycardia, unspecified: Secondary | ICD-10-CM | POA: Diagnosis not present

## 2021-09-24 DIAGNOSIS — J9601 Acute respiratory failure with hypoxia: Secondary | ICD-10-CM | POA: Diagnosis not present

## 2021-09-24 DIAGNOSIS — M62561 Muscle wasting and atrophy, not elsewhere classified, right lower leg: Secondary | ICD-10-CM | POA: Diagnosis not present

## 2021-09-24 DIAGNOSIS — R293 Abnormal posture: Secondary | ICD-10-CM | POA: Diagnosis not present

## 2021-09-26 DIAGNOSIS — R471 Dysarthria and anarthria: Secondary | ICD-10-CM | POA: Diagnosis not present

## 2021-09-26 DIAGNOSIS — I5032 Chronic diastolic (congestive) heart failure: Secondary | ICD-10-CM | POA: Diagnosis not present

## 2021-09-26 DIAGNOSIS — I69351 Hemiplegia and hemiparesis following cerebral infarction affecting right dominant side: Secondary | ICD-10-CM | POA: Diagnosis not present

## 2021-09-26 DIAGNOSIS — J9601 Acute respiratory failure with hypoxia: Secondary | ICD-10-CM | POA: Diagnosis not present

## 2021-09-26 DIAGNOSIS — R482 Apraxia: Secondary | ICD-10-CM | POA: Diagnosis not present

## 2021-09-26 DIAGNOSIS — R2689 Other abnormalities of gait and mobility: Secondary | ICD-10-CM | POA: Diagnosis not present

## 2021-09-26 DIAGNOSIS — I480 Paroxysmal atrial fibrillation: Secondary | ICD-10-CM | POA: Diagnosis not present

## 2021-09-26 DIAGNOSIS — I63312 Cerebral infarction due to thrombosis of left middle cerebral artery: Secondary | ICD-10-CM | POA: Diagnosis not present

## 2021-09-26 DIAGNOSIS — I6602 Occlusion and stenosis of left middle cerebral artery: Secondary | ICD-10-CM | POA: Diagnosis not present

## 2021-09-28 ENCOUNTER — Encounter: Payer: Self-pay | Admitting: Internal Medicine

## 2021-09-28 ENCOUNTER — Non-Acute Institutional Stay (SKILLED_NURSING_FACILITY): Payer: Medicare Other | Admitting: Internal Medicine

## 2021-09-28 DIAGNOSIS — R059 Cough, unspecified: Secondary | ICD-10-CM | POA: Diagnosis not present

## 2021-09-28 DIAGNOSIS — J189 Pneumonia, unspecified organism: Secondary | ICD-10-CM | POA: Diagnosis not present

## 2021-09-28 DIAGNOSIS — K219 Gastro-esophageal reflux disease without esophagitis: Secondary | ICD-10-CM

## 2021-09-28 DIAGNOSIS — I69359 Hemiplegia and hemiparesis following cerebral infarction affecting unspecified side: Secondary | ICD-10-CM

## 2021-09-28 DIAGNOSIS — J441 Chronic obstructive pulmonary disease with (acute) exacerbation: Secondary | ICD-10-CM

## 2021-09-28 NOTE — Progress Notes (Unsigned)
Location:   [place] Room Number: 116 Place of Service:  SNF [phone]) Provider:  Veleta Miners MD  Virgie Dad, MD  Patient Care Team: Virgie Dad, MD as PCP - General (Internal Medicine) Jerline Pain, MD as PCP - Cardiology (Cardiology) Deboraha Sprang, MD as PCP - Electrophysiology (Cardiology) Jovita Gamma, MD as Consulting Physician (Neurosurgery)  Extended Emergency Contact Information Primary Emergency Contact: Siharath,[name] Address: [place] of [place] Phone: [phone] Mobile Phone: [phone] Relation: Spouse Secondary Emergency Contact: Clune,[name] Address: [address]          [place], [place] [address] [place] of [place] Mobile Phone: [phone] Relation: Son  Code Status:  Full Code Goals of care: Advanced Directive information    09/28/2021    3:36 PM  Advanced Directives  Does Patient Have a Medical Advance Directive? Yes  Type of Advance Directive Living will  Does patient want to make changes to medical advance directive? No - Patient declined     Chief Complaint  Patient presents with   Acute Visit    Cough    HPI:  Pt is a 76 y.o. 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 Xray repeated yesterday as she was still having Cough and Nurses were concern Also is needing 3 litres of oxygen Usually 2l Feels weak and tires easily Cough  is better and now just Dry No Fever Appetite is Poor  Repeat Xray shows persistent infiltrate in Right lobe. No New Changes   Past Medical History:  Diagnosis Date   Anxiety    Arthritis    "some in my lower back; probably elbows, knees" (11/18/2017)   Atrial fibrillation (HCC)    Bell's palsy    when pt. was 76 yrs old, when under stress the left side of face will droop.   Complication of anesthesia    "vascular OR 2016; BP bottomed out; couldn't get it regulated; ended up in ICU for DAYS" (11/18/2017)   GERD (gastroesophageal reflux disease)    History of kidney stones    Hypertension    Hypertrophic cardiomyopathy (HCC)    severe LV basilar hypertrophy witn no evidence of significant outflow tract obstruction, EF 65-70%, mild LAE, mild TR, grade 1a diastolic dysfunction [id number] (Dr. Candee Furbish) (Atrial Septal Hypertrophy pattern)-- Intra-op TEE with dsignificant outflow tract obstruction - AI, MR & TR   Insomnia    Mild aortic sclerosis    Osteopenia    Peripheral vascular disease (HCC)    Syncope    , Vagal   Past Surgical History:  Procedure Laterality Date   AUGMENTATION MAMMAPLASTY Bilateral    BACK SURGERY     CARDIAC CATHETERIZATION N/A 05/07/2016   Procedure: Left Heart Cath and Coronary Angiography;  Surgeon: Burnell Blanks, MD;  Location: [facility];  Service: Cardiovascular;  Laterality: N/A;   CARDIOVERSION  N/A 09/24/2017   Procedure: CARDIOVERSION;  Surgeon: Larey Dresser, MD;  Location: [facility];  Service: Cardiovascular;  Laterality: N/A;   DILATION AND CURETTAGE OF UTERUS     ENDARTERECTOMY FEMORAL Right 03/02/2015   Procedure: ENDARTERECTOMY RIGHT FEMORAL;  Surgeon: Angelia Mould, MD;  Location: [facility];  Service: Vascular;  Laterality: Right;   ESOPHAGOGASTRODUODENOSCOPY (EGD) WITH PROPOFOL N/A 12/01/2020   Procedure: ESOPHAGOGASTRODUODENOSCOPY (EGD) WITH PROPOFOL;  Surgeon: Jesusita Oka, MD;  Location: [facility];  Service:  General;  Laterality: N/A;   FACIAL COSMETIC SURGERY Left 2002   "related to [facility] @ age 48; left eye/side of face droopy; tried to make area symmetrical"   FEMORAL-POPLITEAL BYPASS GRAFT Right 03/02/2015   Procedure: BYPASS GRAFT FEMORAL-BELOW KNEE POPLITEAL ARTERY;  Surgeon: Angelia Mould, MD;  Location: [facility];  Service: Vascular;  Laterality: Right;   INGUINAL HERNIA REPAIR Bilateral 2002   IR ANGIO INTRA EXTRACRAN SEL COM CAROTID INNOMINATE UNI L MOD SED  11/16/2020   IR CT HEAD LTD  11/13/2020   IR PERCUTANEOUS ART THROMBECTOMY/INFUSION INTRACRANIAL INC DIAG ANGIO  11/13/2020   OVARIAN CYST REMOVAL Left    PEG PLACEMENT N/A 12/01/2020   Procedure: PERCUTANEOUS ENDOSCOPIC GASTROSTOMY (PEG) PLACEMENT;  Surgeon: Jesusita Oka, MD;  Location: [facility];  Service: General;  Laterality: N/A;   PERIPHERAL VASCULAR CATHETERIZATION N/A 01/16/2015   Procedure: Abdominal Aortogram;  Surgeon: Angelia Mould, MD;  Location: [facility] [place];  Service: Cardiovascular;  Laterality: N/A;   POSTERIOR LUMBAR FUSION  2015   "have plates and screws in there"   [place] N/A 11/13/2020   Procedure: IR WITH ANESTHESIA;  Surgeon: Radiologist, Medication, MD;  Location: [facility];  Service: Radiology;  Laterality: N/A;   TONSILLECTOMY      Allergies  Allergen Reactions   Amoxicillin Other (See Comments)    UTI Has patient had a PCN reaction causing immediate rash, facial/tongue/throat swelling, SOB or lightheadedness with hypotension: No Has patient had a PCN reaction causing severe rash involving mucus membranes or skin necrosis: No Has patient had a PCN reaction that required hospitalization: No Has patient had a PCN reaction occurring within the last 10 years: Yes--UTI ONLY If all of the above answers are "NO", then may proceed with Cephalosporin use.    Atenolol Cough   Crestor [Rosuvastatin Calcium] Other (See Comments)    Muscle aches   Pravastatin Other (See  Comments)    Muscle aches   Sulfa Antibiotics Nausea Only   Codeine Other (See Comments)    hallucinations    Allergies as of 09/28/2021       Reactions   Amoxicillin Other (See Comments)   UTI Has patient had a PCN reaction causing immediate rash, facial/tongue/throat swelling, SOB or lightheadedness with hypotension: No Has patient had a PCN reaction causing severe rash involving mucus membranes or skin necrosis: No Has patient had a PCN reaction that required hospitalization: No Has patient had a PCN reaction occurring within the last 10 years: Yes--UTI ONLY If all of the above answers are "NO", then may proceed with Cephalosporin use.   Atenolol Cough   Crestor [rosuvastatin Calcium] Other (See Comments)   Muscle aches   Pravastatin Other (See Comments)   Muscle aches   Sulfa Antibiotics Nausea Only   Codeine Other (See Comments)   hallucinations        Medication List        Accurate as of September 28, 2021  3:36  PM. If you have any questions, ask your nurse or doctor.          aluminum-magnesium hydroxide-simethicone [id number] MG/5ML Susp Commonly known as: MAALOX Take 30 mLs by mouth every 4 (four) hours as needed (heartburn).   amiodarone 200 MG tablet Commonly known as: PACERONE Take 200 mg by mouth daily. May give crushed   amLODipine 5 MG tablet Commonly known as: NORVASC Take 5 mg by mouth daily.   apixaban 5 MG Tabs tablet Commonly known as: ELIQUIS Take 5 mg by mouth 2 (two) times daily. May give crushed   bisacodyl 5 MG EC tablet Commonly known as: DULCOLAX Take 5 mg by mouth 3 (three) times daily as needed for moderate constipation.   buPROPion ER 100 MG 12 hr tablet Commonly known as: WELLBUTRIN SR Take 100 mg by mouth 2 (two) times daily.   dextromethorphan-guaiFENesin 10-100 MG/5ML liquid Commonly known as: ROBITUSSIN-DM Take 10 mLs by mouth every 6 (six) hours as needed for cough.   ezetimibe 10 MG tablet Commonly known as:  ZETIA Take 10 mg by mouth daily. Oral if crushed   furosemide 20 MG tablet Commonly known as: LASIX Take 20 mg by mouth daily as needed. Give Lasix 20mg  for weight gain equal or greater than 3 pounds   hydrALAZINE 10 MG tablet Commonly known as: APRESOLINE Take 10 mg by mouth 3 (three) times daily as needed (SBP > 165 or DBP > 95).   ipratropium-albuterol 0.5-2.5 (3) MG/3ML Soln Commonly known as: DUONEB Take 3 mLs by nebulization every 4 (four) hours as needed.   ipratropium-albuterol 0.5-2.5 (3) MG/3ML Soln Commonly known as: DUONEB Inhale 3 mLs into the lungs every 6 (six) hours as needed.   ketoconazole 2 % cream Commonly known as: NIZORAL Apply 1 application. topically daily as needed for irritation.   nystatin cream Commonly known as: MYCOSTATIN Apply 1 application topically 2 (two) times daily as needed for dry skin (apply to perirectal rash).   ondansetron 4 MG tablet Commonly known as: ZOFRAN Take 4 mg by mouth every 6 (six) hours as needed for nausea or vomiting.   OXYGEN Inhale 3 L into the lungs as directed. to maintain sats > or equal to 90%. May titrate Three Times A Day; 07:00 AM - 03:00 PM, 03:00 PM - 11:00 PM, 11:00 PM - 7:00 am   pantoprazole 40 MG tablet Commonly known as: PROTONIX Take 40 mg by mouth daily.   predniSONE 10 MG tablet Commonly known as: DELTASONE Take 10 mg by mouth daily with breakfast.   predniSONE 5 MG tablet Commonly known as: DELTASONE Take 5 mg by mouth daily with breakfast. Start taking on: September 30, 2021   saccharomyces boulardii 250 MG capsule Commonly known as: FLORASTOR Take 250 mg by mouth 2 (two) times daily.   Stiolto Respimat 2.5-2.5 MCG/ACT Aers Generic drug: Tiotropium Bromide-Olodaterol Inhale 2 puffs into the lungs daily.   Systane 0.4-0.3 % Soln Generic drug: Polyethyl Glycol-Propyl Glycol Place 2 drops into both eyes 4 (four) times daily as needed (irritation).   zinc oxide 20 % ointment Apply 1  application. topically 2 (two) times daily as needed for irritation (apply to perirectal area prn - use with nystatin).        Review of Systems  Constitutional:  Positive for activity change and appetite change.  HENT: Negative.    Respiratory:  Positive for cough and shortness of breath.   Cardiovascular:  Negative for leg swelling.  Gastrointestinal:  Negative for constipation.  Genitourinary: Negative.   Musculoskeletal:  Positive for gait problem. Negative for arthralgias and myalgias.  Skin: Negative.   Neurological:  Negative for dizziness and weakness.  Psychiatric/Behavioral:  Negative for confusion, dysphoric mood and sleep disturbance.     Immunization History  Administered Date(s) Administered   Influenza Split 01/16/2009, 04/30/2010, 01/26/2016, 02/07/2018, 02/01/2020   Influenza, High Dose Seasonal PF 02/07/2018, 01/25/2019, 02/28/2021   Influenza,inj,Quad PF,6+ Mos 03/06/2012, 01/26/2016, 01/10/2017   Influenza,inj,quad, With Preservative 02/14/2015   Moderna Covid-19 Vaccine Bivalent Booster 70yrs & up 02/28/2021   PFIZER(Purple Top)SARS-COV-2 Vaccination 05/06/2019, 05/27/2019, 01/27/2020, 09/18/2020   Pneumococcal Conjugate-13 03/19/2017   Pneumococcal Polysaccharide-23 02/14/2015   Td 02/25/2005   Tdap 05/19/2013   Zoster, Live 07/28/2006   Pertinent  Health Maintenance Due  Topic Date Due   INFLUENZA VACCINE  11/13/2021   DEXA SCAN  Discontinued      07/07/2021    7:30 AM 07/07/2021    7:20 PM 07/08/2021    9:00 AM 07/08/2021    9:30 PM 07/09/2021    8:00 AM  Fall Risk  Patient Fall Risk Level High fall risk High fall risk High fall risk High fall risk Moderate fall risk   Functional Status Survey:    Vitals:   09/28/21 1456  BP: (!) 144/87  Pulse: 69  Resp: 16  Temp: (!) 97 F (36.1 C)  SpO2: 90%  Weight: 97 lb 9.6 oz (44.3 kg)  Height: 5\' 3"  (1.6 m)   Body mass index is 17.29 kg/m. Physical Exam Vitals reviewed.  Constitutional:       Appearance: Normal appearance.  HENT:     Head: Normocephalic.     Nose: Nose normal.     Mouth/Throat:     Mouth: Mucous membranes are moist.     Pharynx: Oropharynx is clear.  Eyes:     Pupils: Pupils are equal, round, and reactive to light.  Cardiovascular:     Rate and Rhythm: Normal rate and regular rhythm.     Pulses: Normal pulses.     Heart sounds: Normal heart sounds. No murmur heard. Pulmonary:     Effort: Pulmonary effort is normal.     Breath sounds: Normal breath sounds. No wheezing.     Comments: Has Rales in Right Lower area Other area are clear Abdominal:     General: Abdomen is flat. Bowel sounds are normal.     Palpations: Abdomen is soft.  Musculoskeletal:        General: No swelling.     Cervical back: Neck supple.  Skin:    General: Skin is warm.  Neurological:     Mental Status: She is alert and oriented to person, place, and time.     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        Thought Content: Thought content normal.     Labs reviewed: Recent Labs    11/29/20 0128 11/30/20 0826 12/02/20 0216 12/04/20 0052 01/23/21 0259 01/30/21 0000 07/06/21 1605 07/06/21 1612 07/07/21 0800 07/08/21 0717 07/09/21 0317 07/11/21 0000 09/18/21 0000  NA 151*   < > 140   < > 135   < > 138   < > 140 141 141 141 135*  K 4.4   < > 4.2   < > 3.6   < > 4.4   < >  4.3 4.0 3.8 4.1 5.1  CL 113*   < > 108   < > 94*   < > 104   < > 107 105 109 103 101  CO2 30   < > 25   < > 28   < > 30  --  27 28 29  26* 27*  GLUCOSE 140*   < > 181*   < > 85   < > 92   < > 80 87 90  --   --   BUN 60*   < > 51*   < > 14   < > 30*   < > 26* 22 21 24* 32*  CREATININE 0.75   < > 0.79   < > 0.85   < > 0.98   < > 0.79 0.71 0.73 0.7 0.9  CALCIUM 8.9   < > 8.4*   < > 8.7*   < > 8.8*  --  8.7* 9.0 8.7* 9.3 8.8  MG 2.3  --  2.2  --  2.0  --  2.1  --   --  1.9 1.9   --   --   PHOS 3.4  --  3.0  --  4.1  --   --   --   --   --   --   --   --    < > = values in this interval not displayed.   Recent Labs    01/22/21 0651 01/23/21 0259 03/26/21 0000 07/06/21 1605  AST 39 28 23 33  ALT 38 34 31 45*  ALKPHOS 68 57 67 76  BILITOT 1.3* 1.5*  --  0.7  PROT 6.1* 5.6*  --  6.2*  ALBUMIN 3.4* 3.1* 3.3* 3.2*   Recent Labs    01/22/21 0651 01/22/21 0658 02/02/21 0519 03/26/21 0000 07/06/21 1605 07/06/21 1612 07/08/21 0717 07/09/21 0317 07/11/21 0000  WBC 10.7*   < > 9.3   < > 9.3  --  9.8 9.5 8.3  NEUTROABS 7.7  --  7.2  --  6.5  --   --   --   --   HGB 15.5*   < > 14.4   < > 14.6   < > 14.9 14.3 14.1  HCT 48.4*   < > 45.3   < > 45.2   < > 45.4 43.7 42  MCV 96.4   < > 95.4  --  93.0  --  91.2 90.7  --   PLT 237   < > 286   < > 241  --  235 237 224   < > = values in this interval not displayed.   Lab Results  Component Value Date   TSH 0.632 07/06/2021   Lab Results  Component Value Date   HGBA1C 6.0 (H) 11/13/2020   Lab Results  Component Value Date   CHOL 209 (H) 11/13/2020   HDL 70 11/13/2020   LDLCALC 120 (H) 11/13/2020   TRIG 97 11/13/2020   CHOLHDL 3.0 11/13/2020    Significant Diagnostic Results in last 30 days:  No results found.  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

## 2021-10-01 DIAGNOSIS — I63312 Cerebral infarction due to thrombosis of left middle cerebral artery: Secondary | ICD-10-CM | POA: Diagnosis not present

## 2021-10-01 DIAGNOSIS — J9601 Acute respiratory failure with hypoxia: Secondary | ICD-10-CM | POA: Diagnosis not present

## 2021-10-01 DIAGNOSIS — M62561 Muscle wasting and atrophy, not elsewhere classified, right lower leg: Secondary | ICD-10-CM | POA: Diagnosis not present

## 2021-10-01 DIAGNOSIS — R2689 Other abnormalities of gait and mobility: Secondary | ICD-10-CM | POA: Diagnosis not present

## 2021-10-01 DIAGNOSIS — I69351 Hemiplegia and hemiparesis following cerebral infarction affecting right dominant side: Secondary | ICD-10-CM | POA: Diagnosis not present

## 2021-10-01 DIAGNOSIS — I5032 Chronic diastolic (congestive) heart failure: Secondary | ICD-10-CM | POA: Diagnosis not present

## 2021-10-01 DIAGNOSIS — R278 Other lack of coordination: Secondary | ICD-10-CM | POA: Diagnosis not present

## 2021-10-03 DIAGNOSIS — R001 Bradycardia, unspecified: Secondary | ICD-10-CM | POA: Diagnosis not present

## 2021-10-03 DIAGNOSIS — R293 Abnormal posture: Secondary | ICD-10-CM | POA: Diagnosis not present

## 2021-10-03 DIAGNOSIS — M6389 Disorders of muscle in diseases classified elsewhere, multiple sites: Secondary | ICD-10-CM | POA: Diagnosis not present

## 2021-10-03 DIAGNOSIS — I69351 Hemiplegia and hemiparesis following cerebral infarction affecting right dominant side: Secondary | ICD-10-CM | POA: Diagnosis not present

## 2021-10-03 DIAGNOSIS — I63312 Cerebral infarction due to thrombosis of left middle cerebral artery: Secondary | ICD-10-CM | POA: Diagnosis not present

## 2021-10-03 DIAGNOSIS — R278 Other lack of coordination: Secondary | ICD-10-CM | POA: Diagnosis not present

## 2021-10-08 ENCOUNTER — Ambulatory Visit: Payer: Medicare Other | Admitting: Nurse Practitioner

## 2021-10-08 ENCOUNTER — Ambulatory Visit (INDEPENDENT_AMBULATORY_CARE_PROVIDER_SITE_OTHER): Payer: Medicare Other

## 2021-10-08 ENCOUNTER — Telehealth: Payer: Self-pay | Admitting: Nurse Practitioner

## 2021-10-08 ENCOUNTER — Encounter: Payer: Self-pay | Admitting: Nurse Practitioner

## 2021-10-08 VITALS — BP 138/70 | HR 69 | Temp 97.7°F

## 2021-10-08 DIAGNOSIS — J189 Pneumonia, unspecified organism: Secondary | ICD-10-CM

## 2021-10-08 DIAGNOSIS — J439 Emphysema, unspecified: Secondary | ICD-10-CM

## 2021-10-08 DIAGNOSIS — J9611 Chronic respiratory failure with hypoxia: Secondary | ICD-10-CM | POA: Diagnosis not present

## 2021-10-08 MED ORDER — CEFPODOXIME PROXETIL 200 MG PO TABS: 200.0000 mg | ORAL_TABLET | Freq: Two times a day (BID) | ORAL | 0 refills | Status: DC

## 2021-10-08 NOTE — Assessment & Plan Note (Signed)
No increased SOB from baseline per pt; she has had increased O2 requirements since initially being diagnosed with pna from 2 lpm to 3lpm. She recently completed prednisone taper x 2; given this and no bronchospasm on exam, will hold off on further steroid therapy. Continue LABA/LAMA and PRN nebs as previously ordered.

## 2021-10-09 ENCOUNTER — Encounter: Payer: Self-pay | Admitting: Orthopedic Surgery

## 2021-10-09 ENCOUNTER — Non-Acute Institutional Stay (SKILLED_NURSING_FACILITY): Payer: Medicare Other | Admitting: Orthopedic Surgery

## 2021-10-09 DIAGNOSIS — M25561 Pain in right knee: Secondary | ICD-10-CM

## 2021-10-09 DIAGNOSIS — R278 Other lack of coordination: Secondary | ICD-10-CM | POA: Diagnosis not present

## 2021-10-09 DIAGNOSIS — M6389 Disorders of muscle in diseases classified elsewhere, multiple sites: Secondary | ICD-10-CM | POA: Diagnosis not present

## 2021-10-09 DIAGNOSIS — M25551 Pain in right hip: Secondary | ICD-10-CM | POA: Diagnosis not present

## 2021-10-09 DIAGNOSIS — M62561 Muscle wasting and atrophy, not elsewhere classified, right lower leg: Secondary | ICD-10-CM | POA: Diagnosis not present

## 2021-10-09 DIAGNOSIS — I69351 Hemiplegia and hemiparesis following cerebral infarction affecting right dominant side: Secondary | ICD-10-CM | POA: Diagnosis not present

## 2021-10-09 DIAGNOSIS — R2689 Other abnormalities of gait and mobility: Secondary | ICD-10-CM | POA: Diagnosis not present

## 2021-10-09 DIAGNOSIS — J189 Pneumonia, unspecified organism: Secondary | ICD-10-CM | POA: Diagnosis not present

## 2021-10-09 DIAGNOSIS — I5032 Chronic diastolic (congestive) heart failure: Secondary | ICD-10-CM | POA: Diagnosis not present

## 2021-10-09 DIAGNOSIS — R293 Abnormal posture: Secondary | ICD-10-CM | POA: Diagnosis not present

## 2021-10-09 DIAGNOSIS — R001 Bradycardia, unspecified: Secondary | ICD-10-CM | POA: Diagnosis not present

## 2021-10-09 DIAGNOSIS — I63312 Cerebral infarction due to thrombosis of left middle cerebral artery: Secondary | ICD-10-CM | POA: Diagnosis not present

## 2021-10-09 DIAGNOSIS — I69359 Hemiplegia and hemiparesis following cerebral infarction affecting unspecified side: Secondary | ICD-10-CM | POA: Diagnosis not present

## 2021-10-09 DIAGNOSIS — J9601 Acute respiratory failure with hypoxia: Secondary | ICD-10-CM | POA: Diagnosis not present

## 2021-10-09 NOTE — Progress Notes (Signed)
Location:  [facility] Room Number: 116/A Place of Service:  SNF [phone]) Provider: Yvonna Alanis, NP   Patient Care Team: Virgie Dad, MD as PCP - General (Internal Medicine) Jerline Pain, MD as PCP - Cardiology (Cardiology) Deboraha Sprang, MD as PCP - Electrophysiology (Cardiology) Jovita Gamma, MD as Consulting Physician (Neurosurgery)  Extended Emergency Contact Information Primary Emergency Contact: Krebbs,[name] Address: [place] of [place] Phone: [phone] Mobile Phone: [phone] Relation: Spouse Secondary Emergency Contact: Santillanes,[name] Address: [address]          [place], [place] [address] [place] of [place] Mobile Phone: [phone] Relation: Son  Code Status:  Full Code Goals of care: Advanced Directive information    10/09/2021    2:42 PM  Advanced Directives  Does Patient Have a Medical Advance Directive? Yes  Type of Advance Directive Living will  Does patient want to make changes to medical advance directive? No - Patient declined     Chief Complaint  Patient presents with   Acute Visit    Right hip pain    HPI:  Pt is a 76 y.o. female seen today for an acute visit for right hip pain.   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 PT has noticed increased right leg pain x 2 days. Involves sciatic/ lateral portion of hip. Pain increased when bearing weight. No recent injuries or falls. Pain rated 5/10, described as sharp, radiates to right foot. In addition, her right knee is also painful when ambulating. Her husband has been putting voltaren gel on her right hip and knee twice daily. Treatment interventions discussed. They would like to try pain management before imaging.   06/06 CXR revealed right lower lobe pneumonia. She was given prednisone taper x 2  and doxycycline. 06/26 she was seen by pulmonary for unresolved pneumonia. She was started on mucinex, flutter valve and cefpodoxime x 7 days. Sputum culture ordered- not completed at this time. She is crrently using 3 liters oxygen. Remains on Stiolto and albuterol.     Past Medical History:  Diagnosis Date   Anxiety    Arthritis    "some in my lower back; probably elbows, knees" (11/18/2017)   Atrial fibrillation (HCC)    Bell's palsy    when pt. was 76 yrs old, when under stress the left side of face will droop.   Complication of anesthesia    "vascular OR 2016; BP bottomed out; couldn't get it regulated; ended up in ICU for DAYS" (11/18/2017)   GERD (gastroesophageal reflux disease)    History of kidney stones    Hypertension    Hypertrophic cardiomyopathy (HCC)    severe LV basilar hypertrophy witn no evidence of significant outflow tract obstruction, EF 65-70%, mild LAE, mild TR, grade 1a diastolic dysfunction [id number] (Dr. Candee Furbish) (Atrial Septal Hypertrophy pattern)-- Intra-op TEE with dsignificant outflow tract obstruction - AI, MR & TR   Insomnia    Mild aortic sclerosis    Osteopenia    Peripheral vascular disease (HCC)    Syncope    , Vagal   Past Surgical History:  Procedure Laterality Date   AUGMENTATION MAMMAPLASTY Bilateral    BACK SURGERY     CARDIAC CATHETERIZATION N/A 05/07/2016   Procedure: Left Heart Cath and Coronary Angiography;  Surgeon: Harrell Gave  Ellene Route, MD;  Location: [facility] INVASIVE CV LAB;  Service: Cardiovascular;  Laterality: N/A;   CARDIOVERSION N/A 09/24/2017   Procedure: CARDIOVERSION;  Surgeon: Laurey Morale, MD;  Location: [facility] ENDOSCOPY;  Service: Cardiovascular;  Laterality: N/A;   DILATION AND CURETTAGE OF UTERUS     ENDARTERECTOMY FEMORAL Right 03/02/2015   Procedure: ENDARTERECTOMY RIGHT FEMORAL;  Surgeon: Chuck Hint, MD;  Location: [facility] OR;  Service: Vascular;  Laterality: Right;   ESOPHAGOGASTRODUODENOSCOPY (EGD) WITH PROPOFOL  N/A 12/01/2020   Procedure: ESOPHAGOGASTRODUODENOSCOPY (EGD) WITH PROPOFOL;  Surgeon: Diamantina Monks, MD;  Location: [facility] ENDOSCOPY;  Service: General;  Laterality: N/A;   FACIAL COSMETIC SURGERY Left 2002   "related to Bell's Palsy @ age 43; left eye/side of face droopy; tried to make area symmetrical"   FEMORAL-POPLITEAL BYPASS GRAFT Right 03/02/2015   Procedure: BYPASS GRAFT FEMORAL-BELOW KNEE POPLITEAL ARTERY;  Surgeon: Chuck Hint, MD;  Location: [facility] OR;  Service: Vascular;  Laterality: Right;   INGUINAL HERNIA REPAIR Bilateral 2002   IR ANGIO INTRA EXTRACRAN SEL COM CAROTID INNOMINATE UNI L MOD SED  11/16/2020   IR CT HEAD LTD  11/13/2020   IR PERCUTANEOUS ART THROMBECTOMY/INFUSION INTRACRANIAL INC DIAG ANGIO  11/13/2020   OVARIAN CYST REMOVAL Left    PEG PLACEMENT N/A 12/01/2020   Procedure: PERCUTANEOUS ENDOSCOPIC GASTROSTOMY (PEG) PLACEMENT;  Surgeon: Diamantina Monks, MD;  Location: [facility] ENDOSCOPY;  Service: General;  Laterality: N/A;   PERIPHERAL VASCULAR CATHETERIZATION N/A 01/16/2015   Procedure: Abdominal Aortogram;  Surgeon: Chuck Hint, MD;  Location: [facility] INV[facility]IVE CV LAB;  Service: Cardiovascular;  Laterality: N/A;   POSTERIOR LUMBAR FUSION  2015   "have plates and screws in there"   RADIOLOGY WITH ANESTHESIA N/A 11/13/2020   Procedure: IR WITH ANESTHESIA;  Surgeon: Radiologist, Medication, MD;  Location: [facility] OR;  Service: Radiology;  Laterality: N/A;   TONSILLECTOMY      Allergies  Allergen Reactions   Amoxicillin Other (See Comments)    UTI Has patient had a PCN reaction causing immediate rash, facial/tongue/throat swelling, SOB or lightheadedness with hypotension: No Has patient had a PCN reaction causing severe rash involving mucus membranes or skin necrosis: No Has patient had a PCN reaction that required hospitalization: No Has patient had a PCN reaction occurring within the last 10 years: Yes--UTI ONLY If all of the above answers are "NO", then may proceed  with Cephalosporin use.    Atenolol Cough   Crestor [Rosuvastatin Calcium] Other (See Comments)    Muscle aches   Pravastatin Other (See Comments)    Muscle aches   Sulfa Antibiotics Nausea Only   Codeine Other (See Comments)    hallucinations    Outpatient Encounter Medications as of 10/09/2021  Medication Sig   aluminum-magnesium hydroxide-simethicone (MAALOX) 200-200-20 MG/5ML SUSP Take 30 mLs by mouth every 4 (four) hours as needed (heartburn).   amiodarone (PACERONE) 200 MG tablet Take 200 mg by mouth daily. May give crushed   amLODipine (NORVASC) 5 MG tablet Take 5 mg by mouth daily.   apixaban (ELIQUIS) 5 MG TABS tablet Take 5 mg by mouth 2 (two) times daily. May give crushed   bisacodyl (DULCOLAX) 5 MG EC tablet Take 5 mg by mouth 3 (three) times daily as needed for moderate constipation.   buPROPion ER (WELLBUTRIN SR) 100 MG 12 hr tablet Take 100 mg by mouth 2 (two) times daily.   diclofenac Sodium (VOLTAREN) 1 % GEL Apply topically as needed (Apply to right hip).  ezetimibe (ZETIA) 10 MG tablet Take 10 mg by mouth daily. Oral if crushed   furosemide (LASIX) 20 MG tablet Take 20 mg by mouth daily as needed. Give Lasix 20mg  for weight gain equal or greater than 3 pounds   guaiFENesin (MUCINEX) 600 MG 12 hr tablet Take by mouth 2 (two) times daily.   hydrALAZINE (APRESOLINE) 10 MG tablet Take 10 mg by mouth 3 (three) times daily as needed (SBP > 165 or DBP > 95).   ipratropium-albuterol (DUONEB) 0.5-2.5 (3) MG/3ML SOLN Take 3 mLs by nebulization every 4 (four) hours as needed.   ipratropium-albuterol (DUONEB) 0.5-2.5 (3) MG/3ML SOLN Inhale 3 mLs into the lungs every 6 (six) hours as needed.   ketoconazole (NIZORAL) 2 % cream Apply 1 application. topically daily as needed for irritation.   nystatin cream (MYCOSTATIN) Apply 1 application topically 2 (two) times daily as needed for dry skin (apply to perirectal rash).   ondansetron (ZOFRAN) 4 MG tablet Take 4 mg by mouth every 6  (six) hours as needed for nausea or vomiting.   OXYGEN Inhale 3 L into the lungs as directed. to maintain sats > or equal to 90%. May titrate Three Times A Day; 07:00 AM - 03:00 PM, 03:00 PM - 11:00 PM, 11:00 PM - 7:00 am   pantoprazole (PROTONIX) 40 MG tablet Take 40 mg by mouth daily.   Polyethyl Glycol-Propyl Glycol (SYSTANE) 0.4-0.3 % SOLN Place 2 drops into both eyes 4 (four) times daily as needed (irritation).   Tiotropium Bromide-Olodaterol (STIOLTO RESPIMAT) 2.5-2.5 MCG/ACT AERS Inhale 2 puffs into the lungs daily.   zinc oxide 20 % ointment Apply 1 application. topically 2 (two) times daily as needed for irritation (apply to perirectal area prn - use with nystatin).   [DISCONTINUED] cefpodoxime (VANTIN) 200 MG tablet Take 1 tablet (200 mg total) by mouth 2 (two) times daily for 7 days.   [DISCONTINUED] saccharomyces boulardii (FLORASTOR) 250 MG capsule Take 250 mg by mouth 2 (two) times daily.   No facility-administered encounter medications on file as of 10/09/2021.    Review of Systems  Constitutional:  Negative for activity change, appetite change, chills, fatigue and fever.  HENT:  Positive for trouble swallowing. Negative for congestion.   Eyes:  Negative for visual disturbance.  Respiratory:  Positive for cough, shortness of breath and wheezing.        Oxygen use  Cardiovascular:  Negative for chest pain and leg swelling.  Gastrointestinal:  Negative for abdominal distention, abdominal pain, constipation, diarrhea, nausea and vomiting.  Genitourinary:  Negative for dysuria and frequency.  Musculoskeletal:  Positive for arthralgias and gait problem.  Skin:  Negative for wound.  Neurological:  Positive for weakness. Negative for dizziness and headaches.  Psychiatric/Behavioral:  Negative for confusion, dysphoric mood and sleep disturbance. The patient is not nervous/anxious.     Immunization History  Administered Date(s) Administered   Influenza Split 01/16/2009, 04/30/2010,  01/26/2016, 02/07/2018, 02/01/2020   Influenza, High Dose Seasonal PF 02/07/2018, 01/25/2019, 02/28/2021   Influenza,inj,Quad PF,6+ Mos 03/06/2012, 01/26/2016, 01/10/2017   Influenza,inj,quad, With Preservative 02/14/2015   Moderna Covid-19 Vaccine Bivalent Booster 26yrs & up 02/28/2021   PFIZER(Purple Top)SARS-COV-2 Vaccination 05/06/2019, 05/27/2019, 01/27/2020, 09/18/2020   Pneumococcal Conjugate-13 03/19/2017   Pneumococcal Polysaccharide-23 02/14/2015   Td 02/25/2005   Tdap 05/19/2013   Zoster, Live 07/28/2006   Pertinent  Health Maintenance Due  Topic Date Due   INFLUENZA VACCINE  11/13/2021   DEXA SCAN  Discontinued      07/07/2021  7:30 AM 07/07/2021    7:20 PM 07/08/2021    9:00 AM 07/08/2021    9:30 PM 07/09/2021    8:00 AM  Fall Risk  Patient Fall Risk Level High fall risk High fall risk High fall risk High fall risk Moderate fall risk   Functional Status Survey:    Vitals:   10/09/21 1429  BP: 124/71  Pulse: 71  Resp: 16  Temp: (!) 96.2 F (35.7 C)  SpO2: 92%  Weight: 101 lb 9.6 oz (46.1 kg)  Height: 5\' 3"  (1.6 m)   Body mass index is 18 kg/m. Physical Exam Vitals reviewed.  Constitutional:      General: She is not in acute distress. HENT:     Head: Normocephalic.  Eyes:     General:        Right eye: No discharge.        Left eye: No discharge.  Cardiovascular:     Rate and Rhythm: Normal rate and regular rhythm.     Pulses: Normal pulses.     Heart sounds: Normal heart sounds.  Pulmonary:     Effort: Pulmonary effort is normal. No respiratory distress.     Breath sounds: Examination of the right-middle field reveals decreased breath sounds. Examination of the right-lower field reveals decreased breath sounds. Decreased breath sounds and rhonchi present. No wheezing or rales.  Abdominal:     General: Bowel sounds are normal. There is no distension.     Palpations: Abdomen is soft.     Tenderness: There is no abdominal tenderness.   Musculoskeletal:     Cervical back: Neck supple.     Right lower leg: Tenderness present. No swelling or deformity. No edema.     Left lower leg: No edema.     Comments: Tenderness over right sciatic, lateral hip and MCL, limited ROM to right hip and knee  Skin:    General: Skin is warm and dry.     Capillary Refill: Capillary refill takes less than 2 seconds.  Neurological:     General: No focal deficit present.     Mental Status: She is alert. Mental status is at baseline.     Motor: Weakness present.     Gait: Gait abnormal.     Comments: Right sided weakness  Psychiatric:        Mood and Affect: Mood normal.        Behavior: Behavior normal.     Labs reviewed: Recent Labs    11/29/20 0128 11/30/20 0826 12/02/20 0216 12/04/20 0052 01/23/21 0259 01/30/21 0000 07/06/21 1605 07/06/21 1612 07/07/21 0800 07/08/21 0717 07/09/21 0317 07/11/21 0000 09/18/21 0000  NA 151*   < > 140   < > 135   < > 138   < > 140 141 141 141 135*  K 4.4   < > 4.2   < > 3.6   < > 4.4   < > 4.3 4.0 3.8 4.1 5.1  CL 113*   < > 108   < > 94*   < > 104   < > 107 105 109 103 101  CO2 30   < > 25   < > 28   < > 30  --  27 28 29  26* 27*  GLUCOSE 140*   < > 181*   < > 85   < > 92   < > 80 87 90  --   --   BUN 60*   < >  51*   < > 14   < > 30*   < > 26* 22 21 24* 32*  CREATININE 0.75   < > 0.79   < > 0.85   < > 0.98   < > 0.79 0.71 0.73 0.7 0.9  CALCIUM 8.9   < > 8.4*   < > 8.7*   < > 8.8*  --  8.7* 9.0 8.7* 9.3 8.8  MG 2.3  --  2.2  --  2.0  --  2.1  --   --  1.9 1.9  --   --   PHOS 3.4  --  3.0  --  4.1  --   --   --   --   --   --   --   --    < > = values in this interval not displayed.   Recent Labs    01/22/21 0651 01/23/21 0259 03/26/21 0000 07/06/21 1605  AST 39 28 23 33  ALT 38 34 31 45*  ALKPHOS 68 57 67 76  BILITOT 1.3* 1.5*  --  0.7  PROT 6.1* 5.6*  --  6.2*  ALBUMIN 3.4* 3.1* 3.3* 3.2*   Recent Labs    01/22/21 0651 01/22/21 0658 02/02/21 0519 03/26/21 0000 07/06/21 1605  07/06/21 1612 07/08/21 0717 07/09/21 0317 07/11/21 0000  WBC 10.7*   < > 9.3   < > 9.3  --  9.8 9.5 8.3  NEUTROABS 7.7  --  7.2  --  6.5  --   --   --   --   HGB 15.5*   < > 14.4   < > 14.6   < > 14.9 14.3 14.1  HCT 48.4*   < > 45.3   < > 45.2   < > 45.4 43.7 42  MCV 96.4   < > 95.4  --  93.0  --  91.2 90.7  --   PLT 237   < > 286   < > 241  --  235 237 224   < > = values in this interval not displayed.   Lab Results  Component Value Date   TSH 0.632 07/06/2021   Lab Results  Component Value Date   HGBA1C 6.0 (H) 11/13/2020   Lab Results  Component Value Date   CHOL 209 (H) 11/13/2020   HDL 70 11/13/2020   LDLCALC 120 (H) 11/13/2020   TRIG 97 11/13/2020   CHOLHDL 3.0 11/13/2020    Significant Diagnostic Results in last 30 days:  DG Chest 2 View  Result Date: 10/08/2021 CLINICAL DATA:  RML pneumonia EXAM: CHEST - 2 VIEW COMPARISON:  July 06, 2021. FINDINGS: Ill-defined peripheral right midlung and basilar opacities. No visible pneumothorax. Small right pleural effusion. Similar cardiomediastinal silhouette. Osteopenia. Polyarticular degenerative change. IMPRESSION: Ill-defined right basilar and mid lung peripheral opacities, which could represent atelectasis and/or pneumonia. Small right pleural effusion. Electronically Signed   By: Margaretha Sheffield M.D.   On: 10/08/2021 12:42    Assessment/Plan 1. Right hip pain - increased pain when bearing weight- noticed by PT - tenderness over sciatic/ lateral hip - ? OA/ piriformis or sciatic involvement/ fracture  - x ray recommended- on hold per husband - will start tylenol 1000 mg po bid x 7 days - increase voltaren gel 2% to QID- apply to right hip/knee - recommend xray right hip/knee if pain persists  2. Acute pain of right knee - see above  3. Multifocal pneumonia - followed by pulmonary -  06/06 CXR right lower lobe pneumonia - completed prednisone taper x 2 and doxycycline - mucinex and cefpodoxime started 06/26 -  sputum culture- pending - cont flutter valve - cont oxygen use   4. 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

## 2021-10-09 NOTE — Telephone Encounter (Signed)
Called Misty back this morning and she had a few questions.  Is there an end date for the mucinex 600mg  twice  day? Is it safe for her to take the antibiotic that was prescribed Cefpodoxime because she has a intolerance to penicillin Was checking on orders for sputum?   Please advise Florentina Addison

## 2021-10-09 NOTE — Telephone Encounter (Signed)
Called Misty back and gave her the information from [place]. Misty verbalized understanding. Nothing further needed at this ttime

## 2021-10-10 DIAGNOSIS — I6602 Occlusion and stenosis of left middle cerebral artery: Secondary | ICD-10-CM | POA: Diagnosis not present

## 2021-10-10 DIAGNOSIS — I480 Paroxysmal atrial fibrillation: Secondary | ICD-10-CM | POA: Diagnosis not present

## 2021-10-10 DIAGNOSIS — R471 Dysarthria and anarthria: Secondary | ICD-10-CM | POA: Diagnosis not present

## 2021-10-10 DIAGNOSIS — R482 Apraxia: Secondary | ICD-10-CM | POA: Diagnosis not present

## 2021-10-10 MED ORDER — CEFPODOXIME PROXETIL 200 MG PO TABS: 200.0000 mg | ORAL_TABLET | Freq: Two times a day (BID) | ORAL | 0 refills | Status: AC

## 2021-10-11 DIAGNOSIS — I63312 Cerebral infarction due to thrombosis of left middle cerebral artery: Secondary | ICD-10-CM | POA: Diagnosis not present

## 2021-10-11 DIAGNOSIS — R2689 Other abnormalities of gait and mobility: Secondary | ICD-10-CM | POA: Diagnosis not present

## 2021-10-11 DIAGNOSIS — M6389 Disorders of muscle in diseases classified elsewhere, multiple sites: Secondary | ICD-10-CM | POA: Diagnosis not present

## 2021-10-11 DIAGNOSIS — J9601 Acute respiratory failure with hypoxia: Secondary | ICD-10-CM | POA: Diagnosis not present

## 2021-10-11 DIAGNOSIS — I5032 Chronic diastolic (congestive) heart failure: Secondary | ICD-10-CM | POA: Diagnosis not present

## 2021-10-11 DIAGNOSIS — R001 Bradycardia, unspecified: Secondary | ICD-10-CM | POA: Diagnosis not present

## 2021-10-11 DIAGNOSIS — R278 Other lack of coordination: Secondary | ICD-10-CM | POA: Diagnosis not present

## 2021-10-11 DIAGNOSIS — I69351 Hemiplegia and hemiparesis following cerebral infarction affecting right dominant side: Secondary | ICD-10-CM | POA: Diagnosis not present

## 2021-10-12 DIAGNOSIS — R482 Apraxia: Secondary | ICD-10-CM | POA: Diagnosis not present

## 2021-10-12 DIAGNOSIS — R2689 Other abnormalities of gait and mobility: Secondary | ICD-10-CM | POA: Diagnosis not present

## 2021-10-12 DIAGNOSIS — I63312 Cerebral infarction due to thrombosis of left middle cerebral artery: Secondary | ICD-10-CM | POA: Diagnosis not present

## 2021-10-12 DIAGNOSIS — R471 Dysarthria and anarthria: Secondary | ICD-10-CM | POA: Diagnosis not present

## 2021-10-12 DIAGNOSIS — J9601 Acute respiratory failure with hypoxia: Secondary | ICD-10-CM | POA: Diagnosis not present

## 2021-10-12 DIAGNOSIS — I480 Paroxysmal atrial fibrillation: Secondary | ICD-10-CM | POA: Diagnosis not present

## 2021-10-12 DIAGNOSIS — I69351 Hemiplegia and hemiparesis following cerebral infarction affecting right dominant side: Secondary | ICD-10-CM | POA: Diagnosis not present

## 2021-10-12 DIAGNOSIS — I5032 Chronic diastolic (congestive) heart failure: Secondary | ICD-10-CM | POA: Diagnosis not present

## 2021-10-12 DIAGNOSIS — I6602 Occlusion and stenosis of left middle cerebral artery: Secondary | ICD-10-CM | POA: Diagnosis not present

## 2021-10-15 DIAGNOSIS — R2689 Other abnormalities of gait and mobility: Secondary | ICD-10-CM | POA: Diagnosis not present

## 2021-10-15 DIAGNOSIS — I63312 Cerebral infarction due to thrombosis of left middle cerebral artery: Secondary | ICD-10-CM | POA: Diagnosis not present

## 2021-10-15 DIAGNOSIS — I69351 Hemiplegia and hemiparesis following cerebral infarction affecting right dominant side: Secondary | ICD-10-CM | POA: Diagnosis not present

## 2021-10-15 DIAGNOSIS — I5032 Chronic diastolic (congestive) heart failure: Secondary | ICD-10-CM | POA: Diagnosis not present

## 2021-10-15 DIAGNOSIS — R278 Other lack of coordination: Secondary | ICD-10-CM | POA: Diagnosis not present

## 2021-10-15 DIAGNOSIS — M62561 Muscle wasting and atrophy, not elsewhere classified, right lower leg: Secondary | ICD-10-CM | POA: Diagnosis not present

## 2021-10-15 DIAGNOSIS — J9601 Acute respiratory failure with hypoxia: Secondary | ICD-10-CM | POA: Diagnosis not present

## 2021-10-16 DIAGNOSIS — I69351 Hemiplegia and hemiparesis following cerebral infarction affecting right dominant side: Secondary | ICD-10-CM | POA: Diagnosis not present

## 2021-10-16 DIAGNOSIS — I5032 Chronic diastolic (congestive) heart failure: Secondary | ICD-10-CM | POA: Diagnosis not present

## 2021-10-16 DIAGNOSIS — J9601 Acute respiratory failure with hypoxia: Secondary | ICD-10-CM | POA: Diagnosis not present

## 2021-10-16 DIAGNOSIS — R2689 Other abnormalities of gait and mobility: Secondary | ICD-10-CM | POA: Diagnosis not present

## 2021-10-16 DIAGNOSIS — I63312 Cerebral infarction due to thrombosis of left middle cerebral artery: Secondary | ICD-10-CM | POA: Diagnosis not present

## 2021-10-18 ENCOUNTER — Telehealth: Payer: Self-pay | Admitting: Nurse Practitioner

## 2021-10-18 DIAGNOSIS — M6389 Disorders of muscle in diseases classified elsewhere, multiple sites: Secondary | ICD-10-CM | POA: Diagnosis not present

## 2021-10-18 DIAGNOSIS — I5032 Chronic diastolic (congestive) heart failure: Secondary | ICD-10-CM | POA: Diagnosis not present

## 2021-10-18 DIAGNOSIS — I69351 Hemiplegia and hemiparesis following cerebral infarction affecting right dominant side: Secondary | ICD-10-CM | POA: Diagnosis not present

## 2021-10-18 DIAGNOSIS — R278 Other lack of coordination: Secondary | ICD-10-CM | POA: Diagnosis not present

## 2021-10-18 DIAGNOSIS — R2689 Other abnormalities of gait and mobility: Secondary | ICD-10-CM | POA: Diagnosis not present

## 2021-10-18 DIAGNOSIS — R293 Abnormal posture: Secondary | ICD-10-CM | POA: Diagnosis not present

## 2021-10-18 DIAGNOSIS — R001 Bradycardia, unspecified: Secondary | ICD-10-CM | POA: Diagnosis not present

## 2021-10-18 DIAGNOSIS — J9601 Acute respiratory failure with hypoxia: Secondary | ICD-10-CM | POA: Diagnosis not present

## 2021-10-18 DIAGNOSIS — I63312 Cerebral infarction due to thrombosis of left middle cerebral artery: Secondary | ICD-10-CM | POA: Diagnosis not present

## 2021-10-18 NOTE — Telephone Encounter (Signed)
Called to inform the nurse that the order for sputum was sent to the lab but the lab does not have any record of it.  They are not sure what happened to it.  Would like to know they should recollect the sputum or d/c since the antibiotic is finished.  Please call back to discuss at [phone].

## 2021-10-19 DIAGNOSIS — I69351 Hemiplegia and hemiparesis following cerebral infarction affecting right dominant side: Secondary | ICD-10-CM | POA: Diagnosis not present

## 2021-10-19 DIAGNOSIS — I63312 Cerebral infarction due to thrombosis of left middle cerebral artery: Secondary | ICD-10-CM | POA: Diagnosis not present

## 2021-10-19 DIAGNOSIS — R2689 Other abnormalities of gait and mobility: Secondary | ICD-10-CM | POA: Diagnosis not present

## 2021-10-19 DIAGNOSIS — I6602 Occlusion and stenosis of left middle cerebral artery: Secondary | ICD-10-CM | POA: Diagnosis not present

## 2021-10-19 DIAGNOSIS — M6389 Disorders of muscle in diseases classified elsewhere, multiple sites: Secondary | ICD-10-CM | POA: Diagnosis not present

## 2021-10-19 DIAGNOSIS — R001 Bradycardia, unspecified: Secondary | ICD-10-CM | POA: Diagnosis not present

## 2021-10-19 DIAGNOSIS — R471 Dysarthria and anarthria: Secondary | ICD-10-CM | POA: Diagnosis not present

## 2021-10-19 DIAGNOSIS — R293 Abnormal posture: Secondary | ICD-10-CM | POA: Diagnosis not present

## 2021-10-19 DIAGNOSIS — J9601 Acute respiratory failure with hypoxia: Secondary | ICD-10-CM | POA: Diagnosis not present

## 2021-10-19 DIAGNOSIS — R278 Other lack of coordination: Secondary | ICD-10-CM | POA: Diagnosis not present

## 2021-10-19 DIAGNOSIS — I5032 Chronic diastolic (congestive) heart failure: Secondary | ICD-10-CM | POA: Diagnosis not present

## 2021-10-19 DIAGNOSIS — I480 Paroxysmal atrial fibrillation: Secondary | ICD-10-CM | POA: Diagnosis not present

## 2021-10-19 DIAGNOSIS — R482 Apraxia: Secondary | ICD-10-CM | POA: Diagnosis not present

## 2021-10-19 NOTE — Telephone Encounter (Signed)
Spoke with Misty at [facility]  She is calling to let us know that the sputum we asked for was sent to lab and the lab lost it  Pt has since completed her abx and symptoms have improved  She is not coughing up purulent sputum and her lungs sound clear  Katie, do you feel like the sputum is still needed at this point? If so, will have to be recollected

## 2021-10-19 NOTE — Telephone Encounter (Signed)
Find to hold off for now. If she develops a recurrent productive cough, we will need to recollect. Thanks.

## 2021-10-19 NOTE — Telephone Encounter (Signed)
Spoke with Misty and notified of response and verbalized understanding.

## 2021-10-22 ENCOUNTER — Ambulatory Visit: Payer: Medicare Other | Admitting: Nurse Practitioner

## 2021-10-22 DIAGNOSIS — I5032 Chronic diastolic (congestive) heart failure: Secondary | ICD-10-CM | POA: Diagnosis not present

## 2021-10-22 DIAGNOSIS — J9601 Acute respiratory failure with hypoxia: Secondary | ICD-10-CM | POA: Diagnosis not present

## 2021-10-22 DIAGNOSIS — R2689 Other abnormalities of gait and mobility: Secondary | ICD-10-CM | POA: Diagnosis not present

## 2021-10-22 DIAGNOSIS — I69351 Hemiplegia and hemiparesis following cerebral infarction affecting right dominant side: Secondary | ICD-10-CM | POA: Diagnosis not present

## 2021-10-22 DIAGNOSIS — I63312 Cerebral infarction due to thrombosis of left middle cerebral artery: Secondary | ICD-10-CM | POA: Diagnosis not present

## 2021-10-24 DIAGNOSIS — R001 Bradycardia, unspecified: Secondary | ICD-10-CM | POA: Diagnosis not present

## 2021-10-24 DIAGNOSIS — I5032 Chronic diastolic (congestive) heart failure: Secondary | ICD-10-CM | POA: Diagnosis not present

## 2021-10-24 DIAGNOSIS — J9601 Acute respiratory failure with hypoxia: Secondary | ICD-10-CM | POA: Diagnosis not present

## 2021-10-24 DIAGNOSIS — I69351 Hemiplegia and hemiparesis following cerebral infarction affecting right dominant side: Secondary | ICD-10-CM | POA: Diagnosis not present

## 2021-10-24 DIAGNOSIS — I63312 Cerebral infarction due to thrombosis of left middle cerebral artery: Secondary | ICD-10-CM | POA: Diagnosis not present

## 2021-10-24 DIAGNOSIS — R293 Abnormal posture: Secondary | ICD-10-CM | POA: Diagnosis not present

## 2021-10-24 DIAGNOSIS — M6389 Disorders of muscle in diseases classified elsewhere, multiple sites: Secondary | ICD-10-CM | POA: Diagnosis not present

## 2021-10-24 DIAGNOSIS — R278 Other lack of coordination: Secondary | ICD-10-CM | POA: Diagnosis not present

## 2021-10-24 DIAGNOSIS — R2689 Other abnormalities of gait and mobility: Secondary | ICD-10-CM | POA: Diagnosis not present

## 2021-10-29 ENCOUNTER — Encounter: Payer: Self-pay | Admitting: Nurse Practitioner

## 2021-10-29 ENCOUNTER — Ambulatory Visit: Payer: Medicare Other | Admitting: Nurse Practitioner

## 2021-10-29 DIAGNOSIS — J439 Emphysema, unspecified: Secondary | ICD-10-CM

## 2021-10-29 DIAGNOSIS — I69351 Hemiplegia and hemiparesis following cerebral infarction affecting right dominant side: Secondary | ICD-10-CM | POA: Diagnosis not present

## 2021-10-29 DIAGNOSIS — R471 Dysarthria and anarthria: Secondary | ICD-10-CM | POA: Diagnosis not present

## 2021-10-29 DIAGNOSIS — J9611 Chronic respiratory failure with hypoxia: Secondary | ICD-10-CM

## 2021-10-29 DIAGNOSIS — I63312 Cerebral infarction due to thrombosis of left middle cerebral artery: Secondary | ICD-10-CM | POA: Diagnosis not present

## 2021-10-29 DIAGNOSIS — J189 Pneumonia, unspecified organism: Secondary | ICD-10-CM

## 2021-10-29 DIAGNOSIS — I480 Paroxysmal atrial fibrillation: Secondary | ICD-10-CM | POA: Diagnosis not present

## 2021-10-29 DIAGNOSIS — I6602 Occlusion and stenosis of left middle cerebral artery: Secondary | ICD-10-CM | POA: Diagnosis not present

## 2021-10-29 DIAGNOSIS — R482 Apraxia: Secondary | ICD-10-CM | POA: Diagnosis not present

## 2021-10-29 DIAGNOSIS — J9601 Acute respiratory failure with hypoxia: Secondary | ICD-10-CM | POA: Diagnosis not present

## 2021-10-29 DIAGNOSIS — R2689 Other abnormalities of gait and mobility: Secondary | ICD-10-CM | POA: Diagnosis not present

## 2021-10-29 DIAGNOSIS — I5032 Chronic diastolic (congestive) heart failure: Secondary | ICD-10-CM | POA: Diagnosis not present

## 2021-10-29 NOTE — Progress Notes (Signed)
@Patient  ID: Beverly Li, female    DOB: [birth year], 76 y.o.   MRN: [id number]  Chief Complaint  Patient presents with   Follow-up    Patient has no complaints.     Referring provider: Virgie Dad, MD  HPI: 76 year old female, former smoker (50 pack years) followed for COPD and chronic respiratory failure.  She is a patient of Dr. Agustina Caroli and last seen in office on 10/08/2021 by [facility] NP.  Past medical history significant for hypertension, CHF, recent stroke, PAF on Eliquis and amiodarone, PAD, hypertrophic cardiomyopathy, GERD, dysphagia post stroke, HLD, severe protein calorie malnutrition, long QT interval.  TEST/EVENTS:  12/05/2020 CT chest w con: Atherosclerosis and CAD.  There is cardiomegaly and LVH.  Marked enlargement of the central pulmonary arteries.  No evidence of PE.  No LAD.  There is severe emphysema.  Small bilateral pleural effusions have developed with bibasilar compressive atelectasis.  No superimposed focal pulmonary infiltrate.  Central airways are widely patent. 12/21/2020 swallow study: Overall improved oropharyngeal function since 8/24.  She did still have trace aspiration with thin liquids followed by strong cough.  Recommended initiating a dysphagia 1 diet with nectar thick liquids.  She was allowed to have thin liquids outside of meals. 10/08/2021 CXR 2 view: There are ill-defined peripheral right midlung and right basilar opacities.  There is a small right pleural effusion.  10/08/2021: Today-acute Patient presents today for acute visit with husband.  She has not been seen since January 2022.  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  She was also recently hospitalized for bradycardia.  She was discharged to skilled nursing at [facility], which she is normally a long-term care resident of.  Earlier this month, she reported having increased cough with shortness of breath and was evaluated by [facility] provider.  Chest  x-ray showed a right middle lobe pneumonia.  She was treated with prednisone taper and doxycycline course.  She then had another acute visit on 09/28/2021 with their onsite provider.  She was still having a persistent cough, feeling fatigued with poor appetite and also requiring 3 L of oxygen compared to her normal 2 lpm.  Repeat x-ray showed a persistent infiltrate in the right lobe without any new changes.  She was treated for possible AECOPD with prednisone taper and recommended to follow-up here.   Today, she reports having a persistent cough which is now productive with brown sputum at times.  She reports feeling like her breathing is relatively stable without any increase shortness of breath; however she does continue to require 3 L/min supplemental O2 to maintain her oxygen saturations.  She has also felt like she has had some fevers recently but there are none reported on her vitals from the facility.  Appetite is poor and has not returned to her baseline.  She also feels more fatigued than she normally does.  Her husband reports that she does not seem to be coughing or have any witnessed aspiration events while eating.  They deny any hemoptysis, night sweats, lower extremity edema, wheezing. She continues on Stiolto and is receiving duonebs per the [facility] from the facility. Unresolved multifocal pneumonia in the right middle lobe and right lower lobe with minimal clinical improvement since doxycycline course. I am concerned she could be aspirating so may consider repeat swallow study moving forward. She does work with speech therapy regularly at her facility and swallow from September showed mild aspiration risk. Limited by abx choices today due  to allergies, amiodarone use, and hx of prolonged QT interval - will treat with cefpodoxime x 7 days. Check sputum culture. Mucociliary clearance therapies recommended. Continue nebs as previously ordered. Strict ED precautions. Breathing stable and already treated  with 2 prednisone tapers so held further steroid use. Close follow up.  10/29/2021: Today - follow up Patient presents today with husband for follow-up after being treated for unresolved multifocal pneumonia.  She was treated with doxycycline course at her facility and then presented to [place] with recurrence of productive cough and unresolved right midlung and right basilar opacities as well as a small right parapneumonic effusion.  She was treated with cefpodoxime course and mucociliary clearance therapies.  Today, she reports feeling much better.  Her cough has resolved.  Breathing is overall at her baseline.  She is still on 3 L supplemental O2; however, they have not tried decreasing her back to her baseline of 2 L.  They deny any fevers, chills, night sweats, hemoptysis.  She continues on Stiolto daily and gets DuoNebs PRN.  Allergies  Allergen Reactions   Amoxicillin Other (See Comments)    UTI Has patient had a PCN reaction causing immediate rash, facial/tongue/throat swelling, SOB or lightheadedness with hypotension: No Has patient had a PCN reaction causing severe rash involving mucus membranes or skin necrosis: No Has patient had a PCN reaction that required hospitalization: No Has patient had a PCN reaction occurring within the last 10 years: Yes--UTI ONLY If all of the above answers are "NO", then may proceed with Cephalosporin use.    Atenolol Cough   Crestor [Rosuvastatin Calcium] Other (See Comments)    Muscle aches   Pravastatin Other (See Comments)    Muscle aches   Sulfa Antibiotics Nausea Only   Codeine Other (See Comments)    hallucinations    Immunization History  Administered Date(s) Administered   Influenza Split 01/16/2009, 04/30/2010, 01/26/2016, 02/07/2018, 02/01/2020   Influenza, High Dose Seasonal PF 02/07/2018, 01/25/2019, 02/28/2021   Influenza,inj,Quad PF,6+ Mos 03/06/2012, 01/26/2016, 01/10/2017   Influenza,inj,quad, With Preservative 02/14/2015   Moderna  Covid-19 Vaccine Bivalent Booster 70yrs & up 02/28/2021   PFIZER(Purple Top)SARS-COV-2 Vaccination 05/06/2019, 05/27/2019, 01/27/2020, 09/18/2020   Pneumococcal Conjugate-13 03/19/2017   Pneumococcal Polysaccharide-23 02/14/2015   Td 02/25/2005   Tdap 05/19/2013   Zoster, Live 07/28/2006    Past Medical History:  Diagnosis Date   Anxiety    Arthritis    "some in my lower back; probably elbows, knees" (11/18/2017)   Atrial fibrillation (HCC)    Bell's palsy    when pt. was 76 yrs old, when under stress the left side of face will droop.   Complication of anesthesia    "vascular OR 2016; BP bottomed out; couldn't get it regulated; ended up in ICU for DAYS" (11/18/2017)   GERD (gastroesophageal reflux disease)    History of kidney stones    Hypertension    Hypertrophic cardiomyopathy (HCC)    severe LV basilar hypertrophy witn no evidence of significant outflow tract obstruction, EF 65-70%, mild LAE, mild TR, grade 1a diastolic dysfunction 05/15/10 (Dr. Donato Schultz) (Atrial Septal Hypertrophy pattern)-- Intra-op TEE with dsignificant outflow tract obstruction - AI, MR & TR   Insomnia    Mild aortic sclerosis    Osteopenia    Peripheral vascular disease (HCC)    Syncope    , Vagal    Tobacco History: Social History   Tobacco Use  Smoking Status Former   Packs/day: 1.00   Years: 50.00  Total pack years: 50.00   Types: Cigarettes   Quit date: 12/15/2014   Years since quitting: 6.8  Smokeless Tobacco Never   Counseling given: Not Answered   Outpatient Medications Prior to Visit  Medication Sig Dispense Refill   aluminum-magnesium hydroxide-simethicone (MAALOX) 200-200-20 MG/5ML SUSP Take 30 mLs by mouth every 4 (four) hours as needed (heartburn).     amiodarone (PACERONE) 200 MG tablet Take 200 mg by mouth daily. May give crushed     amLODipine (NORVASC) 5 MG tablet Take 5 mg by mouth daily.     apixaban (ELIQUIS) 5 MG TABS tablet Take 5 mg by mouth 2 (two) times daily. May  give crushed     bisacodyl (DULCOLAX) 5 MG EC tablet Take 5 mg by mouth 3 (three) times daily as needed for moderate constipation.     buPROPion ER (WELLBUTRIN SR) 100 MG 12 hr tablet Take 100 mg by mouth 2 (two) times daily.     diclofenac Sodium (VOLTAREN) 1 % GEL Apply topically as needed (Apply to right hip).     ezetimibe (ZETIA) 10 MG tablet Take 10 mg by mouth daily. Oral if crushed     furosemide (LASIX) 20 MG tablet Take 20 mg by mouth daily as needed. Give Lasix 20mg  for weight gain equal or greater than 3 pounds     guaiFENesin (MUCINEX) 600 MG 12 hr tablet Take by mouth 2 (two) times daily.     hydrALAZINE (APRESOLINE) 10 MG tablet Take 10 mg by mouth 3 (three) times daily as needed (SBP > 165 or DBP > 95).     ipratropium-albuterol (DUONEB) 0.5-2.5 (3) MG/3ML SOLN Take 3 mLs by nebulization every 4 (four) hours as needed.     ipratropium-albuterol (DUONEB) 0.5-2.5 (3) MG/3ML SOLN Inhale 3 mLs into the lungs every 6 (six) hours as needed.     ketoconazole (NIZORAL) 2 % cream Apply 1 application. topically daily as needed for irritation.     nystatin cream (MYCOSTATIN) Apply 1 application topically 2 (two) times daily as needed for dry skin (apply to perirectal rash).     ondansetron (ZOFRAN) 4 MG tablet Take 4 mg by mouth every 6 (six) hours as needed for nausea or vomiting.     OXYGEN Inhale 3 L into the lungs as directed. to maintain sats > or equal to 90%. May titrate Three Times A Day; 07:00 AM - 03:00 PM, 03:00 PM - 11:00 PM, 11:00 PM - 7:00 am     pantoprazole (PROTONIX) 40 MG tablet Take 40 mg by mouth daily.     Polyethyl Glycol-Propyl Glycol (SYSTANE) 0.4-0.3 % SOLN Place 2 drops into both eyes 4 (four) times daily as needed (irritation).     Tiotropium Bromide-Olodaterol (STIOLTO RESPIMAT) 2.5-2.5 MCG/ACT AERS Inhale 2 puffs into the lungs daily.     zinc oxide 20 % ointment Apply 1 application. topically 2 (two) times daily as needed for irritation (apply to perirectal area  prn - use with nystatin).     No facility-administered medications prior to visit.     Review of Systems:   Constitutional: No further weight loss, night sweats, fevers, chills, fatigue, lassitude HEENT: No headaches, difficulty swallowing, tooth/dental problems, or sore throat. No sneezing, itching, ear ache, nasal congestion, or post nasal drip CV:  No chest pain, orthopnea, PND, swelling in lower extremities, anasarca, dizziness, palpitations, syncope Resp: +shortness of breath with exertion (baseline); resolved cough.  No hemoptysis. No wheezing.  No chest wall deformity GI:  +  Improved appetite.  No heartburn, indigestion, abdominal pain, nausea, vomiting, diarrhea, change in bowel habits, bloody stools.  GU: No dysuria, change in color of urine, urgency or frequency.  No flank pain, no hematuria  Skin: No rash, lesions, ulcerations MSK:  No joint pain or swelling.  No decreased range of motion.  No back pain. + Planter fasciitis Neuro: No dizziness or lightheadedness.  Psych: No depression or anxiety. Mood stable.     Physical Exam:  BP 112/62 (BP Location: Left Arm, Patient Position: Sitting, Cuff Size: Normal)   Pulse 60   Temp 97.7 F (36.5 C) (Oral)   Ht 5\' 5"  (1.651 m)   Wt 100 lb (45.4 kg)   LMP  (LMP Unknown)   SpO2 95%   BMI 16.64 kg/m   GEN: Pleasant, interactive, chronically-ill appearing; in no acute distress. HEENT:  Normocephalic and atraumatic. PERRLA. Sclera white. Nasal turbinates pink, moist and patent bilaterally. No rhinorrhea present. Oropharynx pink and moist, without exudate or edema. No lesions, ulcerations, or postnasal drip.  NECK:  Supple w/ fair ROM. No JVD present. Normal carotid impulses w/o bruits. Thyroid symmetrical with no goiter or nodules palpated. No lymphadenopathy.   CV: RRR, no m/r/g, no peripheral edema. Pulses intact, +2 bilaterally. No cyanosis, pallor or clubbing. PULMONARY:  Unlabored, regular breathing. Diminished bases  bilaterally otherwise clear A&P w/o wheezes/rales/rhonchi. No accessory muscle use. No dullness to percussion. GI: BS present and normoactive. Soft, non-tender to palpation. No organomegaly or masses detected. No CVA tenderness. MSK: No erythema, warmth or tenderness. Cap refil <2 sec all extrem. No deformities or joint swelling noted.  Neuro: A/Ox3. Right flaccid hemiparesis. Aphasia  Skin: Warm, no lesions or rashe Psych: Normal affect and behavior. Judgement and thought content appropriate.     Lab Results:  CBC    Component Value Date/Time   WBC 8.3 07/11/2021 0000   WBC 9.5 07/09/2021 0317   RBC 4.784 07/11/2021 0000   HGB 14.1 07/11/2021 0000   HGB 17.2 (H) 01/25/2020 0923   HCT 42 07/11/2021 0000   HCT 50.0 (H) 01/25/2020 0923   PLT 224 07/11/2021 0000   PLT 270 01/25/2020 0923   MCV 90.7 07/09/2021 0317   MCV 90 01/25/2020 0923   MCH 29.7 07/09/2021 0317   MCHC 32.7 07/09/2021 0317   RDW 16.7 (H) 07/09/2021 0317   RDW 13.2 01/25/2020 0923   LYMPHSABS 1.9 07/06/2021 1605   LYMPHSABS 3.3 (H) 09/23/2017 1629   MONOABS 0.7 07/06/2021 1605   EOSABS 0.1 07/06/2021 1605   EOSABS 0.0 09/23/2017 1629   BASOSABS 0.0 07/06/2021 1605   BASOSABS 0.0 09/23/2017 1629    BMET    Component Value Date/Time   NA 135 (A) 09/18/2021 0000   K 5.1 09/18/2021 0000   CL 101 09/18/2021 0000   CO2 27 (A) 09/18/2021 0000   GLUCOSE 90 07/09/2021 0317   BUN 32 (A) 09/18/2021 0000   CREATININE 0.9 09/18/2021 0000   CREATININE 0.73 07/09/2021 0317   CALCIUM 8.8 09/18/2021 0000   GFRNONAA >60 07/09/2021 0317   GFRAA 65 01/25/2020 0923    BNP    Component Value Date/Time   BNP 1,459.3 (H) 02/02/2021 0519     Imaging:  DG Chest 2 View  Result Date: 10/08/2021 CLINICAL DATA:  RML pneumonia EXAM: CHEST - 2 VIEW COMPARISON:  July 06, 2021. FINDINGS: Ill-defined peripheral right midlung and basilar opacities. No visible pneumothorax. Small right pleural effusion. Similar  cardiomediastinal silhouette. Osteopenia. Polyarticular degenerative change. IMPRESSION:  Ill-defined right basilar and mid lung peripheral opacities, which could represent atelectasis and/or pneumonia. Small right pleural effusion. Electronically Signed   By: Margaretha Sheffield M.D.   On: 10/08/2021 12:42         Latest Ref Rng & Units 03/15/2019    2:52 PM  PFT Results  FVC-Pre L 1.85   FVC-Predicted Pre % 66   FVC-Post L 1.91   FVC-Predicted Post % 69   Pre FEV1/FVC % % 45   Post FEV1/FCV % % 48   FEV1-Pre L 0.82   FEV1-Predicted Pre % 39   FEV1-Post L 0.91   DLCO uncorrected ml/min/mmHg 7.74   DLCO UNC% % 41   DLVA Predicted % 53   TLC L 5.13   TLC % Predicted % 104   RV % Predicted % 143     No results found for: "NITRICOXIDE"      Assessment & Plan:   Multifocal pneumonia Clinically improved and appears to have recovered well.  She completed 2 courses of antibiotics, first with doxycycline and then with cefpodoxime.  Tolerated well without any difficulties.  We will plan for repeat CXR in 4 weeks to ensure resolution.  I am still concerned that there could be a component of aspiration contributing to her frequent pneumonias.  Her last swallow study in August showed mild aspiration risk.  We may consider repeating swallow moving forward.  She does work with SLP on a regular basis.  Patient Instructions  Continue Stiolto 2 puffs daily Continue Albuterol inhaler 2 puffs or duoneb 3 mL neb every 6 hours as needed for shortness of breath or wheezing. Notify if symptoms persist despite rescue inhaler/neb use. Continue supplemental oxygen 2-3 lpm POC and continuous at night for goal oxygen >88-90% Continue Flutter valve 2-3 times a day as needed for chest congestion/cough   Follow up in 4 weeks with Dr. Lamonte Sakai or Alanson Aly with repeat chest x ray. If symptoms do not improve or worsen, please contact office for sooner follow up or seek emergency care.    COPD (chronic  obstructive pulmonary disease) ([place]) Compensated on current regimen.  Continue LAMA LABA therapy with Stiolto and as needed albuterol/DuoNebs.  Chronic respiratory failure with hypoxia (HCC) We were able to decrease her to 2 L/min on POC today at her visit and she maintained oxygen saturations greater than 93%; however, she declined walking oximetry or mobilizing in office.  She recently was diagnosed with planter fasciitis and has caused some pain when standing.  Order sent to [facility] facility to monitor oxygen with mobilization and titrate back up to 3 L/min if she is unable to maintain oxygen levels greater than 88 to 90% on 2 L/min with activity.    I spent 28 minutes of dedicated to the care of this patient on the date of this encounter to include pre-visit review of records, face-to-face time with the patient discussing conditions above, post visit ordering of testing, clinical documentation with the electronic health record, making appropriate referrals as documented, and communicating necessary findings to members of the patients care team.  Clayton Bibles, NP 10/29/2021  Pt aware and understands NP's role.

## 2021-10-29 NOTE — Assessment & Plan Note (Signed)
Compensated on current regimen.  Continue LAMA LABA therapy with Stiolto and as needed albuterol/DuoNebs.

## 2021-10-29 NOTE — Assessment & Plan Note (Signed)
We were able to decrease her to 2 L/min on POC today at her visit and she maintained oxygen saturations greater than 93%; however, she declined walking oximetry or mobilizing in office.  She recently was diagnosed with planter fasciitis and has caused some pain when standing.  Order sent to [facility] facility to monitor oxygen with mobilization and titrate back up to 3 L/min if she is unable to maintain oxygen levels greater than 88 to 90% on 2 L/min with activity.

## 2021-10-29 NOTE — Assessment & Plan Note (Signed)
Clinically improved and appears to have recovered well.  She completed 2 courses of antibiotics, first with doxycycline and then with cefpodoxime.  Tolerated well without any difficulties.  We will plan for repeat CXR in 4 weeks to ensure resolution.  I am still concerned that there could be a component of aspiration contributing to her frequent pneumonias.  Her last swallow study in August showed mild aspiration risk.  We may consider repeating swallow moving forward.  She does work with SLP on a regular basis.  Patient Instructions  Continue Stiolto 2 puffs daily Continue Albuterol inhaler 2 puffs or duoneb 3 mL neb every 6 hours as needed for shortness of breath or wheezing. Notify if symptoms persist despite rescue inhaler/neb use. Continue supplemental oxygen 2-3 lpm POC and continuous at night for goal oxygen >88-90% Continue Flutter valve 2-3 times a day as needed for chest congestion/cough   Follow up in 4 weeks with Dr. Delton Coombes or Philis Nettle with repeat chest x ray. If symptoms do not improve or worsen, please contact office for sooner follow up or seek emergency care.

## 2021-10-29 NOTE — Patient Instructions (Addendum)
Continue Stiolto 2 puffs daily Continue Albuterol inhaler 2 puffs or duoneb 3 mL neb every 6 hours as needed for shortness of breath or wheezing. Notify if symptoms persist despite rescue inhaler/neb use. Continue supplemental oxygen 2-3 lpm POC and continuous at night for goal oxygen >88-90% Continue Flutter valve 2-3 times a day as needed for chest congestion/cough   Follow up in 4 weeks with Dr. Delton Coombes or Philis Nettle with repeat chest x ray. If symptoms do not improve or worsen, please contact office for sooner follow up or seek emergency care.

## 2021-10-30 DIAGNOSIS — R293 Abnormal posture: Secondary | ICD-10-CM | POA: Diagnosis not present

## 2021-10-30 DIAGNOSIS — J9601 Acute respiratory failure with hypoxia: Secondary | ICD-10-CM | POA: Diagnosis not present

## 2021-10-30 DIAGNOSIS — I63312 Cerebral infarction due to thrombosis of left middle cerebral artery: Secondary | ICD-10-CM | POA: Diagnosis not present

## 2021-10-30 DIAGNOSIS — I5032 Chronic diastolic (congestive) heart failure: Secondary | ICD-10-CM | POA: Diagnosis not present

## 2021-10-30 DIAGNOSIS — R2689 Other abnormalities of gait and mobility: Secondary | ICD-10-CM | POA: Diagnosis not present

## 2021-10-30 DIAGNOSIS — R001 Bradycardia, unspecified: Secondary | ICD-10-CM | POA: Diagnosis not present

## 2021-10-30 DIAGNOSIS — R278 Other lack of coordination: Secondary | ICD-10-CM | POA: Diagnosis not present

## 2021-10-30 DIAGNOSIS — I69351 Hemiplegia and hemiparesis following cerebral infarction affecting right dominant side: Secondary | ICD-10-CM | POA: Diagnosis not present

## 2021-10-30 DIAGNOSIS — M6389 Disorders of muscle in diseases classified elsewhere, multiple sites: Secondary | ICD-10-CM | POA: Diagnosis not present

## 2021-11-01 ENCOUNTER — Encounter: Payer: Self-pay | Admitting: Adult Health

## 2021-11-01 DIAGNOSIS — I5032 Chronic diastolic (congestive) heart failure: Secondary | ICD-10-CM | POA: Diagnosis not present

## 2021-11-01 DIAGNOSIS — R2689 Other abnormalities of gait and mobility: Secondary | ICD-10-CM | POA: Diagnosis not present

## 2021-11-01 DIAGNOSIS — I63312 Cerebral infarction due to thrombosis of left middle cerebral artery: Secondary | ICD-10-CM | POA: Diagnosis not present

## 2021-11-01 DIAGNOSIS — I69351 Hemiplegia and hemiparesis following cerebral infarction affecting right dominant side: Secondary | ICD-10-CM | POA: Diagnosis not present

## 2021-11-01 DIAGNOSIS — J9601 Acute respiratory failure with hypoxia: Secondary | ICD-10-CM | POA: Diagnosis not present

## 2021-11-01 NOTE — Progress Notes (Signed)
Location:  [facility] Room Number: 116-A Place of Service:  SNF [phone]) Provider:  Tamsen Roers, MD  Patient Care Team: [name], MD as PCP - General (Internal Medicine) [name], MD as PCP - Cardiology (Cardiology) Duke Salvia, MD as PCP - Electrophysiology (Cardiology) Shirlean Kelly, MD as Consulting Physician (Neurosurgery)  Extended Emergency Contact Information Primary Emergency Contact: Berns,[name] Address: [address]          [place] [phone] [name] Home Phone: [phone] Mobile Phone: [phone] Relation: Spouse Secondary Emergency Contact: Gilmore,[name] Address: [address]          [place], [place] [address] [name] Mobile Phone: [phone] Relation: Son  Code Status:   Goals of care: Advanced Directive information    11/01/2021   11:43 AM  Advanced Directives  Does Patient Have a Medical Advance Directive? Yes  Type of Advance Directive Living will  Does patient want to make changes to medical advance directive? No - Patient declined     Chief Complaint  Patient presents with   Routine    HPI:  Pt is a 76 y.o. female seen today for medical management of chronic diseases.     Past Medical History:  Diagnosis Date   Anxiety    Arthritis    "some in my lower back; probably elbows, knees" (11/18/2017)   Atrial fibrillation (HCC)    Bell's palsy    when pt. was 76 yrs old, when under stress the left side of face will droop.   Complication of anesthesia    "vascular OR 2016; BP bottomed out; couldn't get it regulated; ended up in ICU for DAYS" (11/18/2017)   GERD (gastroesophageal reflux disease)    History of kidney stones    Hypertension    Hypertrophic cardiomyopathy (HCC)    severe LV basilar hypertrophy witn no evidence of significant outflow tract obstruction, EF 65-70%, mild LAE, mild TR, grade 1a diastolic  dysfunction 05/15/10 (Dr. Donato Schultz) (Atrial Septal Hypertrophy pattern)-- Intra-op TEE with dsignificant outflow tract obstruction - AI, MR & TR   Insomnia    Mild aortic sclerosis    Osteopenia    Peripheral vascular disease (HCC)    Syncope    , Vagal   Past Surgical History:  Procedure Laterality Date   AUGMENTATION MAMMAPLASTY Bilateral    BACK SURGERY     CARDIAC CATHETERIZATION N/A 05/07/2016   Procedure: Left Heart Cath and Coronary Angiography;  Surgeon: Kathleene Hazel, MD;  Location: [facility] INVASIVE CV LAB;  Service: Cardiovascular;  Laterality: N/A;   CARDIOVERSION N/A 09/24/2017   Procedure: CARDIOVERSION;  Surgeon: Laurey Morale, MD;  Location: [facility] ENDOSCOPY;  Service: Cardiovascular;  Laterality: N/A;   DILATION AND CURETTAGE OF UTERUS     ENDARTERECTOMY FEMORAL Right 03/02/2015   Procedure: ENDARTERECTOMY RIGHT FEMORAL;  Surgeon: Chuck Hint, MD;  Location: [facility] OR;  Service: Vascular;  Laterality: Right;   ESOPHAGOGASTRODUODENOSCOPY (EGD) WITH PROPOFOL N/A 12/01/2020   Procedure: ESOPHAGOGASTRODUODENOSCOPY (EGD) WITH PROPOFOL;  Surgeon: Diamantina Monks, MD;  Location: [facility] ENDOSCOPY;  Service: General;  Laterality: N/A;   FACIAL COSMETIC SURGERY Left 2002   "related to Bell's Palsy @ age 78; left eye/side of face droopy; tried to make area symmetrical"   FEMORAL-POPLITEAL BYPASS GRAFT Right 03/02/2015   Procedure: BYPASS GRAFT FEMORAL-BELOW KNEE POPLITEAL ARTERY;  Surgeon: Chuck Hint, MD;  Location: [facility] OR;  Service: Vascular;  Laterality: Right;   INGUINAL HERNIA REPAIR Bilateral 2002   IR ANGIO INTRA EXTRACRAN SEL COM CAROTID INNOMINATE UNI L MOD SED  11/16/2020   IR CT HEAD LTD  11/13/2020   IR PERCUTANEOUS ART THROMBECTOMY/INFUSION INTRACRANIAL INC DIAG ANGIO  11/13/2020   OVARIAN CYST REMOVAL Left    PEG PLACEMENT N/A 12/01/2020   Procedure: PERCUTANEOUS ENDOSCOPIC GASTROSTOMY (PEG) PLACEMENT;  Surgeon: Jesusita Oka, MD;  Location: [place];   Service: General;  Laterality: N/A;   PERIPHERAL VASCULAR CATHETERIZATION N/A 01/16/2015   Procedure: Abdominal Aortogram;  Surgeon: Angelia Mould, MD;  Location: [facility] CV LAB;  Service: Cardiovascular;  Laterality: N/A;   POSTERIOR LUMBAR FUSION  2015   "have plates and screws in there"   Boron N/A 11/13/2020   Procedure: IR WITH ANESTHESIA;  Surgeon: Radiologist, Medication, MD;  Location: [place];  Service: Radiology;  Laterality: N/A;   TONSILLECTOMY      Allergies  Allergen Reactions   Amoxicillin Other (See Comments)    UTI Has patient had a PCN reaction causing immediate rash, facial/tongue/throat swelling, SOB or lightheadedness with hypotension: No Has patient had a PCN reaction causing severe rash involving mucus membranes or skin necrosis: No Has patient had a PCN reaction that required hospitalization: No Has patient had a PCN reaction occurring within the last 10 years: Yes--UTI ONLY If all of the above answers are "NO", then may proceed with Cephalosporin use.    Atenolol Cough   Crestor [Rosuvastatin Calcium] Other (See Comments)    Muscle aches   Pravastatin Other (See Comments)    Muscle aches   Sulfa Antibiotics Nausea Only   Codeine Other (See Comments)    hallucinations    Outpatient Encounter Medications as of 11/01/2021  Medication Sig   aluminum-magnesium hydroxide-simethicone (MAALOX) [id number] MG/5ML SUSP Take 30 mLs by mouth every 4 (four) hours as needed (heartburn).   amiodarone (PACERONE) 200 MG tablet Take 200 mg by mouth daily. May give crushed   amLODipine (NORVASC) 5 MG tablet Take 5 mg by mouth daily.   apixaban (ELIQUIS) 5 MG TABS tablet Take 5 mg by mouth 2 (two) times daily. May give crushed   bisacodyl (DULCOLAX) 5 MG EC tablet Take 5 mg by mouth 3 (three) times daily as needed for moderate constipation.   buPROPion ER (WELLBUTRIN SR) 100 MG 12 hr tablet Take 100 mg by mouth 2 (two) times daily.   diclofenac  Sodium (VOLTAREN) 1 % GEL Apply topically as needed (Apply to right hip).   ezetimibe (ZETIA) 10 MG tablet Take 10 mg by mouth daily. Oral if crushed   furosemide (LASIX) 20 MG tablet Take 20 mg by mouth daily as needed. Give Lasix 20mg  for weight gain equal or greater than 3 pounds   guaiFENesin (MUCINEX) 600 MG 12 hr tablet Take by mouth 2 (two) times daily.   hydrALAZINE (APRESOLINE) 10 MG tablet Take 10 mg by mouth 3 (three) times daily as needed (SBP > 165 or DBP > 95).   ipratropium-albuterol (DUONEB) 0.5-2.5 (3) MG/3ML SOLN Take 3 mLs by nebulization every 4 (four) hours as needed.   ipratropium-albuterol (DUONEB) 0.5-2.5 (3) MG/3ML SOLN Inhale 3 mLs into the lungs every 6 (six) hours as needed.   ketoconazole (NIZORAL) 2 % cream Apply 1 application. topically daily as needed for irritation.   nystatin cream (MYCOSTATIN) Apply 1 application topically 2 (two) times daily as needed for dry skin (apply to perirectal rash).   ondansetron (ZOFRAN) 4  MG tablet Take 4 mg by mouth every 6 (six) hours as needed for nausea or vomiting.   OXYGEN Inhale 3 L into the lungs as directed. to maintain sats > or equal to 90%. May titrate Three Times A Day; 07:00 AM - 03:00 PM, 03:00 PM - 11:00 PM, 11:00 PM - 7:00 am   pantoprazole (PROTONIX) 40 MG tablet Take 40 mg by mouth daily.   Polyethyl Glycol-Propyl Glycol (SYSTANE) 0.4-0.3 % SOLN Place 2 drops into both eyes 4 (four) times daily as needed (irritation).   Tiotropium Bromide-Olodaterol (STIOLTO RESPIMAT) 2.5-2.5 MCG/ACT AERS Inhale 2 puffs into the lungs daily.   zinc oxide 20 % ointment Apply 1 application. topically 2 (two) times daily as needed for irritation (apply to perirectal area prn - use with nystatin).   No facility-administered encounter medications on file as of 11/01/2021.    Review of Systems  Immunization History  Administered Date(s) Administered   Influenza Split 01/16/2009, 04/30/2010, 01/26/2016, 02/07/2018, 02/01/2020    Influenza, High Dose Seasonal PF 02/07/2018, 01/25/2019, 02/28/2021   Influenza,inj,Quad PF,6+ Mos 03/06/2012, 01/26/2016, 01/10/2017   Influenza,inj,quad, With Preservative 02/14/2015   Moderna Covid-19 Vaccine Bivalent Booster 55yrs & up 02/28/2021   PFIZER(Purple Top)SARS-COV-2 Vaccination 05/06/2019, 05/27/2019, 01/27/2020, 09/18/2020   Pneumococcal Conjugate-13 03/19/2017   Pneumococcal Polysaccharide-23 02/14/2015   Td 02/25/2005   Tdap 05/19/2013   Zoster, Live 07/28/2006   Pertinent  Health Maintenance Due  Topic Date Due   INFLUENZA VACCINE  11/13/2021   DEXA SCAN  Discontinued      07/07/2021    7:30 AM 07/07/2021    7:20 PM 07/08/2021    9:00 AM 07/08/2021    9:30 PM 07/09/2021    8:00 AM  Fall Risk  Patient Fall Risk Level High fall risk High fall risk High fall risk High fall risk Moderate fall risk   Functional Status Survey:    Vitals:   11/01/21 1139  BP: 139/71  Pulse: 97  Resp: 15  Temp: (!) 97 F (36.1 C)  SpO2: 94%  Weight: 102 lb (46.3 kg)  Height: 5\' 3"  (1.6 m)   Body mass index is 18.07 kg/m. Physical Exam  Labs reviewed: Recent Labs    11/29/20 0128 11/30/20 12/02/20 12/02/20 0216 12/04/20 0052 01/23/21 0259 01/30/21 0000 07/06/21 1605 07/06/21 1612 07/07/21 0800 07/08/21 0717 07/09/21 0317 07/11/21 0000 09/18/21 0000  NA 151*   < > 140   < > 135   < > 138   < > 140 141 141 141 135*  K 4.4   < > 4.2   < > 3.6   < > 4.4   < > 4.3 4.0 3.8 4.1 5.1  CL 113*   < > 108   < > 94*   < > 104   < > 107 105 109 103 101  CO2 30   < > 25   < > 28   < > 30  --  27 28 29  26* 27*  GLUCOSE 140*   < > 181*   < > 85   < > 92   < > 80 87 90  --   --   BUN 60*   < > 51*   < > 14   < > 30*   < > 26* 22 21 24* 32*  CREATININE 0.75   < > 0.79   < > 0.85   < > 0.98   < > 0.79 0.71 0.73 0.7 0.9  CALCIUM  8.9   < > 8.4*   < > 8.7*   < > 8.8*  --  8.7* 9.0 8.7* 9.3 8.8  MG 2.3  --  2.2  --  2.0  --  2.1  --   --  1.9 1.9  --   --   PHOS 3.4  --  3.0  --  4.1  --    --   --   --   --   --   --   --    < > = values in this interval not displayed.   Recent Labs    01/22/21 0651 01/23/21 0259 03/26/21 0000 07/06/21 1605  AST 39 28 23 33  ALT 38 34 31 45*  ALKPHOS 68 57 67 76  BILITOT 1.3* 1.5*  --  0.7  PROT 6.1* 5.6*  --  6.2*  ALBUMIN 3.4* 3.1* 3.3* 3.2*   Recent Labs    01/22/21 0651 01/22/21 0658 02/02/21 0519 03/26/21 0000 07/06/21 1605 07/06/21 1612 07/08/21 0717 07/09/21 0317 07/11/21 0000  WBC 10.7*   < > 9.3   < > 9.3  --  9.8 9.5 8.3  NEUTROABS 7.7  --  7.2  --  6.5  --   --   --   --   HGB 15.5*   < > 14.4   < > 14.6   < > 14.9 14.3 14.1  HCT 48.4*   < > 45.3   < > 45.2   < > 45.4 43.7 42  MCV 96.4   < > 95.4  --  93.0  --  91.2 90.7  --   PLT 237   < > 286   < > 241  --  235 237 224   < > = values in this interval not displayed.   Lab Results  Component Value Date   TSH 0.632 07/06/2021   Lab Results  Component Value Date   HGBA1C 6.0 (H) 11/13/2020   Lab Results  Component Value Date   CHOL 209 (H) 11/13/2020   HDL 70 11/13/2020   LDLCALC 120 (H) 11/13/2020   TRIG 97 11/13/2020   CHOLHDL 3.0 11/13/2020    Significant Diagnostic Results in last 30 days:  DG Chest 2 View  Result Date: 10/08/2021 CLINICAL DATA:  RML pneumonia EXAM: CHEST - 2 VIEW COMPARISON:  July 06, 2021. FINDINGS: Ill-defined peripheral right midlung and basilar opacities. No visible pneumothorax. Small right pleural effusion. Similar cardiomediastinal silhouette. Osteopenia. Polyarticular degenerative change. IMPRESSION: Ill-defined right basilar and mid lung peripheral opacities, which could represent atelectasis and/or pneumonia. Small right pleural effusion. Electronically Signed   By: Margaretha Sheffield M.D.   On: 10/08/2021 12:42    Assessment/Plan There are no diagnoses linked to this encounter.   Family/ staff Communication:   Labs/tests ordered:

## 2021-11-01 NOTE — Progress Notes (Signed)
This encounter was created in error - please disregard.

## 2021-11-02 ENCOUNTER — Encounter: Payer: Self-pay | Admitting: Podiatry

## 2021-11-02 ENCOUNTER — Ambulatory Visit: Payer: Medicare Other | Admitting: Podiatry

## 2021-11-02 DIAGNOSIS — M722 Plantar fascial fibromatosis: Secondary | ICD-10-CM | POA: Diagnosis not present

## 2021-11-02 DIAGNOSIS — I69351 Hemiplegia and hemiparesis following cerebral infarction affecting right dominant side: Secondary | ICD-10-CM | POA: Diagnosis not present

## 2021-11-02 DIAGNOSIS — I63312 Cerebral infarction due to thrombosis of left middle cerebral artery: Secondary | ICD-10-CM | POA: Diagnosis not present

## 2021-11-02 DIAGNOSIS — R278 Other lack of coordination: Secondary | ICD-10-CM | POA: Diagnosis not present

## 2021-11-02 DIAGNOSIS — M6389 Disorders of muscle in diseases classified elsewhere, multiple sites: Secondary | ICD-10-CM | POA: Diagnosis not present

## 2021-11-02 DIAGNOSIS — R001 Bradycardia, unspecified: Secondary | ICD-10-CM | POA: Diagnosis not present

## 2021-11-02 MED ORDER — TRIAMCINOLONE ACETONIDE 10 MG/ML IJ SUSP: 10.0000 mg | Freq: Once | INTRAMUSCULAR | Status: DC

## 2021-11-05 ENCOUNTER — Non-Acute Institutional Stay (SKILLED_NURSING_FACILITY): Payer: Medicare Other | Admitting: Internal Medicine

## 2021-11-05 ENCOUNTER — Telehealth: Payer: Self-pay | Admitting: *Deleted

## 2021-11-05 ENCOUNTER — Encounter: Payer: Self-pay | Admitting: Internal Medicine

## 2021-11-05 DIAGNOSIS — R278 Other lack of coordination: Secondary | ICD-10-CM | POA: Diagnosis not present

## 2021-11-05 DIAGNOSIS — R2689 Other abnormalities of gait and mobility: Secondary | ICD-10-CM | POA: Diagnosis not present

## 2021-11-05 DIAGNOSIS — I480 Paroxysmal atrial fibrillation: Secondary | ICD-10-CM | POA: Diagnosis not present

## 2021-11-05 DIAGNOSIS — M6389 Disorders of muscle in diseases classified elsewhere, multiple sites: Secondary | ICD-10-CM | POA: Diagnosis not present

## 2021-11-05 DIAGNOSIS — J9601 Acute respiratory failure with hypoxia: Secondary | ICD-10-CM | POA: Diagnosis not present

## 2021-11-05 DIAGNOSIS — Z931 Gastrostomy status: Secondary | ICD-10-CM

## 2021-11-05 DIAGNOSIS — I6602 Occlusion and stenosis of left middle cerebral artery: Secondary | ICD-10-CM | POA: Diagnosis not present

## 2021-11-05 DIAGNOSIS — I63312 Cerebral infarction due to thrombosis of left middle cerebral artery: Secondary | ICD-10-CM | POA: Diagnosis not present

## 2021-11-05 DIAGNOSIS — M722 Plantar fascial fibromatosis: Secondary | ICD-10-CM | POA: Diagnosis not present

## 2021-11-05 DIAGNOSIS — N644 Mastodynia: Secondary | ICD-10-CM | POA: Diagnosis not present

## 2021-11-05 DIAGNOSIS — I69351 Hemiplegia and hemiparesis following cerebral infarction affecting right dominant side: Secondary | ICD-10-CM | POA: Diagnosis not present

## 2021-11-05 DIAGNOSIS — I5032 Chronic diastolic (congestive) heart failure: Secondary | ICD-10-CM | POA: Diagnosis not present

## 2021-11-05 DIAGNOSIS — R482 Apraxia: Secondary | ICD-10-CM | POA: Diagnosis not present

## 2021-11-05 DIAGNOSIS — R001 Bradycardia, unspecified: Secondary | ICD-10-CM | POA: Diagnosis not present

## 2021-11-05 DIAGNOSIS — R471 Dysarthria and anarthria: Secondary | ICD-10-CM | POA: Diagnosis not present

## 2021-11-05 MED ORDER — TRIAMCINOLONE ACETONIDE 10 MG/ML IJ SUSP
10.0000 mg | Freq: Once | INTRAMUSCULAR | Status: AC
  Administered 2021-11-05: 10 mg

## 2021-11-05 NOTE — Telephone Encounter (Signed)
Faxed last office visit notes to [facility] 11/05/21, patient left consult package. They wanted to know if there if any additional orders are needed from physician for care of patient.

## 2021-11-05 NOTE — Progress Notes (Signed)
Location:   [facility] Room Number: 116 Place of Service:  SNF [phone]) Provider:  Einar Crow MD   [name], MD  Patient Care Team: [name], MD as PCP - General (Internal Medicine) [name], MD as PCP - Cardiology (Cardiology) Duke Salvia, MD as PCP - Electrophysiology (Cardiology) Shirlean Kelly, MD as Consulting Physician (Neurosurgery)  Extended Emergency Contact Information Primary Emergency Contact: Paxson,[name] Address: [address]          [place] [phone] [name] Home Phone: [phone] Mobile Phone: [phone] Relation: Spouse Secondary Emergency Contact: Krausz,[name] Address: [address]          [place], [place] [address] [name] Mobile Phone: [phone] Relation: Son  Code Status:  Full Code Goals of care: Advanced Directive information    11/05/2021    9:58 AM  Advanced Directives  Does Patient Have a Medical Advance Directive? Yes  Type of Advance Directive Living will  Does patient want to make changes to medical advance directive? No - Patient declined     Chief Complaint  Patient presents with   Acute Visit    Husband wants her seen about feeding tube/general    HPI:  Pt is a 76 y.o. 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 She received In patient Rehab from 9/8 -01/15/21 Increased Diffusion in Left Basal Ganglia internal and external capsules extending Caudate body in 10/22  Right Heel pain Diagnosed with Plantar Facititis Injected by Dr Noland Fordyce with Therapy today with no pain Has PEG tube Husband wants that removed as it has not been used for past few months Left Breast Pain Patient has h/o Breast implants She  has been having Left Breast pain for past few days  More Positional;l when she they are providing her care   Her Cough is much better on 3 litre's of oxygen  Her weight is stable   Past Medical History:  Diagnosis Date   Anxiety    Arthritis    "some in my lower back; probably elbows, knees" (11/18/2017)   Atrial fibrillation (HCC)    Bell's palsy    when pt. was 76 yrs old, when under stress the left side of face will droop.   Complication of anesthesia    "vascular OR 2016; BP bottomed out; couldn't get it regulated; ended up in ICU for DAYS" (11/18/2017)   GERD (gastroesophageal reflux disease)    History of kidney stones    Hypertension    Hypertrophic cardiomyopathy (HCC)    severe LV basilar hypertrophy witn no evidence of significant outflow tract obstruction, EF 65-70%, mild LAE, mild TR, grade 1a diastolic dysfunction 05/15/10 (Dr. Donato Schultz) (Atrial Septal Hypertrophy pattern)-- Intra-op TEE with dsignificant outflow tract obstruction - AI, MR & TR   Insomnia    Mild aortic sclerosis    Osteopenia    Peripheral vascular disease (HCC)    Syncope    , Vagal   Past Surgical History:  Procedure Laterality Date   AUGMENTATION MAMMAPLASTY Bilateral    BACK SURGERY     CARDIAC CATHETERIZATION N/A 05/07/2016   Procedure: Left Heart Cath and Coronary Angiography;  Surgeon: Nile Dear  Angelena Form, MD;  Location: [place] [facility];  Service: Cardiovascular;  Laterality: N/A;   CARDIOVERSION N/A 09/24/2017   Procedure: CARDIOVERSION;  Surgeon: Larey Dresser, MD;  Location: [facility];  Service: Cardiovascular;  Laterality: N/A;   DILATION AND CURETTAGE OF UTERUS     ENDARTERECTOMY FEMORAL Right 03/02/2015   Procedure: ENDARTERECTOMY RIGHT FEMORAL;  Surgeon: Angelia Mould, MD;  Location: [place];  Service: Vascular;  Laterality: Right;   ESOPHAGOGASTRODUODENOSCOPY (EGD) WITH PROPOFOL N/A 12/01/2020   Procedure: ESOPHAGOGASTRODUODENOSCOPY (EGD) WITH PROPOFOL;  Surgeon:  Jesusita Oka, MD;  Location: [place] [facility];  Service: General;  Laterality: N/A;   FACIAL COSMETIC SURGERY Left 2002   "related to [place] @ age 13; left eye/side of face droopy; tried to make area symmetrical"   FEMORAL-POPLITEAL BYPASS GRAFT Right 03/02/2015   Procedure: BYPASS GRAFT FEMORAL-BELOW KNEE POPLITEAL ARTERY;  Surgeon: Angelia Mould, MD;  Location: [place];  Service: Vascular;  Laterality: Right;   INGUINAL HERNIA REPAIR Bilateral 2002   IR ANGIO INTRA EXTRACRAN SEL COM CAROTID INNOMINATE UNI L MOD SED  11/16/2020   IR CT HEAD LTD  11/13/2020   IR PERCUTANEOUS ART THROMBECTOMY/INFUSION INTRACRANIAL INC DIAG ANGIO  11/13/2020   OVARIAN CYST REMOVAL Left    PEG PLACEMENT N/A 12/01/2020   Procedure: PERCUTANEOUS ENDOSCOPIC GASTROSTOMY (PEG) PLACEMENT;  Surgeon: Jesusita Oka, MD;  Location: [place];  Service: General;  Laterality: N/A;   PERIPHERAL VASCULAR CATHETERIZATION N/A 01/16/2015   Procedure: Abdominal Aortogram;  Surgeon: Angelia Mould, MD;  Location: [place] [facility];  Service: Cardiovascular;  Laterality: N/A;   POSTERIOR LUMBAR FUSION  2015   "have plates and screws in there"   [place] N/A 11/13/2020   Procedure: IR WITH ANESTHESIA;  Surgeon: Radiologist, Medication, MD;  Location: [place];  Service: Radiology;  Laterality: N/A;   TONSILLECTOMY      Allergies  Allergen Reactions   Amoxicillin Other (See Comments)    UTI Has patient had a PCN reaction causing immediate rash, facial/tongue/throat swelling, SOB or lightheadedness with hypotension: No Has patient had a PCN reaction causing severe rash involving mucus membranes or skin necrosis: No Has patient had a PCN reaction that required hospitalization: No Has patient had a PCN reaction occurring within the last 10 years: Yes--UTI ONLY If all of the above answers are "NO", then may proceed with Cephalosporin use.    Atenolol Cough   Crestor [Rosuvastatin Calcium] Other (See  Comments)    Muscle aches   Pravastatin Other (See Comments)    Muscle aches   Sulfa Antibiotics Nausea Only   Codeine Other (See Comments)    hallucinations    Allergies as of 11/05/2021       Reactions   Amoxicillin Other (See Comments)   UTI Has patient had a PCN reaction causing immediate rash, facial/tongue/throat swelling, SOB or lightheadedness with hypotension: No Has patient had a PCN reaction causing severe rash involving mucus membranes or skin necrosis: No Has patient had a PCN reaction that required hospitalization: No Has patient had a PCN reaction occurring within the last 10 years: Yes--UTI ONLY If all of the above answers are "NO", then may proceed with Cephalosporin use.   Atenolol Cough   Crestor [rosuvastatin Calcium] Other (See Comments)   Muscle aches   Pravastatin Other (See Comments)   Muscle aches   Sulfa Antibiotics Nausea Only   Codeine Other (See Comments)   hallucinations  Medication List        Accurate as of November 05, 2021  9:59 AM. If you have any questions, ask your nurse or doctor.          STOP taking these medications    guaiFENesin 600 MG 12 hr tablet Commonly known as: [place] by: Virgie Dad, MD       TAKE these medications    acetaminophen 325 MG tablet Commonly known as: TYLENOL Take 650 mg by mouth every 4 (four) hours as needed.   aluminum-magnesium hydroxide-simethicone [id number] MG/5ML Susp Commonly known as: MAALOX Take 30 mLs by mouth every 4 (four) hours as needed (heartburn).   amiodarone 200 MG tablet Commonly known as: PACERONE Take 200 mg by mouth daily. May give crushed   amLODipine 5 MG tablet Commonly known as: NORVASC Take 5 mg by mouth daily.   apixaban 5 MG Tabs tablet Commonly known as: ELIQUIS Take 5 mg by mouth 2 (two) times daily. May give crushed   bisacodyl 5 MG EC tablet Commonly known as: DULCOLAX Take 5 mg by mouth 3 (three) times daily as needed for moderate  constipation.   buPROPion ER 100 MG 12 hr tablet Commonly known as: WELLBUTRIN SR Take 100 mg by mouth 2 (two) times daily.   ezetimibe 10 MG tablet Commonly known as: ZETIA Take 10 mg by mouth daily. Oral if crushed   furosemide 20 MG tablet Commonly known as: LASIX Take 20 mg by mouth daily as needed. Give Lasix 20mg  for weight gain equal or greater than 3 pounds   hydrALAZINE 10 MG tablet Commonly known as: APRESOLINE Take 10 mg by mouth 3 (three) times daily as needed (SBP > 165 or DBP > 95).   ipratropium-albuterol 0.5-2.5 (3) MG/3ML Soln Commonly known as: DUONEB Inhale 3 mLs into the lungs every 6 (six) hours as needed. What changed: Another medication with the same name was removed. Continue taking this medication, and follow the directions you see here. Changed by: Virgie Dad, MD   ketoconazole 2 % cream Commonly known as: NIZORAL Apply 1 application  topically daily as needed for irritation.   nystatin cream Commonly known as: MYCOSTATIN Apply 1 application topically 2 (two) times daily as needed for dry skin (apply to perirectal rash).   ondansetron 4 MG tablet Commonly known as: ZOFRAN Take 4 mg by mouth every 6 (six) hours as needed for nausea or vomiting.   OXYGEN Inhale 3 L into the lungs as directed. to maintain sats > or equal to 90%. May titrate Three Times A Day; 07:00 AM - 03:00 PM, 03:00 PM - 11:00 PM, 11:00 PM - 7:00 am   pantoprazole 40 MG tablet Commonly known as: PROTONIX Take 40 mg by mouth daily.   Stiolto Respimat 2.5-2.5 MCG/ACT Aers Generic drug: Tiotropium Bromide-Olodaterol Inhale 2 puffs into the lungs daily.   Systane 0.4-0.3 % Soln Generic drug: Polyethyl Glycol-Propyl Glycol Place 2 drops into both eyes 4 (four) times daily as needed (irritation).   Voltaren 1 % Gel Generic drug: diclofenac Sodium Apply topically as needed (Apply to right hip).   zinc oxide 20 % ointment Apply 1 application. topically 2 (two) times daily  as needed for irritation (apply to perirectal area prn - use with nystatin).        Review of Systems  Constitutional:  Negative for activity change and appetite change.  HENT: Negative.    Respiratory:  Negative for cough and shortness of breath.  Cardiovascular:  Negative for leg swelling.  Gastrointestinal:  Negative for constipation.  Genitourinary: Negative.   Musculoskeletal:  Positive for gait problem. Negative for arthralgias and myalgias.  Skin: Negative.   Neurological:  Negative for dizziness and weakness.  Psychiatric/Behavioral:  Negative for confusion, dysphoric mood and sleep disturbance.     Immunization History  Administered Date(s) Administered   Influenza Split 01/16/2009, 04/30/2010, 01/26/2016, 02/07/2018, 02/01/2020   Influenza, High Dose Seasonal PF 02/07/2018, 01/25/2019, 02/28/2021   Influenza,inj,Quad PF,6+ Mos 03/06/2012, 01/26/2016, 01/10/2017   Influenza,inj,quad, With Preservative 02/14/2015   Moderna Covid-19 Vaccine Bivalent Booster 47yrs & up 02/28/2021   PFIZER(Purple Top)SARS-COV-2 Vaccination 05/06/2019, 05/27/2019, 01/27/2020, 09/18/2020   Pneumococcal Conjugate-13 03/19/2017   Pneumococcal Polysaccharide-23 02/14/2015   Td 02/25/2005   Tdap 05/19/2013   Zoster, Live 07/28/2006   Pertinent  Health Maintenance Due  Topic Date Due   INFLUENZA VACCINE  11/13/2021   DEXA SCAN  Discontinued      07/07/2021    7:30 AM 07/07/2021    7:20 PM 07/08/2021    9:00 AM 07/08/2021    9:30 PM 07/09/2021    8:00 AM  Fall Risk  Patient Fall Risk Level High fall risk High fall risk High fall risk High fall risk Moderate fall risk   Functional Status Survey:    Vitals:   11/05/21 0932  BP: 139/71  Pulse: 97  Resp: 15  Temp: (!) 97 F (36.1 C)  SpO2: 94%  Weight: 102 lb (46.3 kg)  Height: 5\' 3"  (1.6 m)   Body mass index is 18.07 kg/m. Physical Exam Vitals reviewed.  Constitutional:      Appearance: Normal appearance.  HENT:     Head:  Normocephalic.     Nose: Nose normal.     Mouth/Throat:     Mouth: Mucous membranes are moist.     Pharynx: Oropharynx is clear.  Eyes:     Pupils: Pupils are equal, round, and reactive to light.  Cardiovascular:     Rate and Rhythm: Normal rate and regular rhythm.     Pulses: Normal pulses.     Heart sounds: Normal heart sounds. No murmur heard.    Comments: Breast Exam Right breast she has Hard Mass in Right upper quadrant Not tender  Left breast ? Small Lump which was tender Pulmonary:     Effort: Pulmonary effort is normal.     Breath sounds: Normal breath sounds.  Abdominal:     General: Abdomen is flat. Bowel sounds are normal.     Palpations: Abdomen is soft.  Musculoskeletal:        General: No swelling.     Cervical back: Neck supple.  Skin:    General: Skin is warm.  Neurological:     Mental Status: She is alert.     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        Thought Content: Thought content normal.     Labs reviewed: Recent Labs    11/29/20 0128 11/30/20 0826 12/02/20 0216 12/04/20 0052 01/23/21 0259 01/30/21 0000 07/06/21 1605 07/06/21 1612 07/07/21 0800 07/08/21 0717 07/09/21 0317 07/11/21 0000 09/18/21 0000  NA 151*   < > 140   < > 135   < > 138   < > 140 141 141 141 135*  K 4.4   < >  4.2   < > 3.6   < > 4.4   < > 4.3 4.0 3.8 4.1 5.1  CL 113*   < > 108   < > 94*   < > 104   < > 107 105 109 103 101  CO2 30   < > 25   < > 28   < > 30  --  27 28 29  26* 27*  GLUCOSE 140*   < > 181*   < > 85   < > 92   < > 80 87 90  --   --   BUN 60*   < > 51*   < > 14   < > 30*   < > 26* 22 21 24* 32*  CREATININE 0.75   < > 0.79   < > 0.85   < > 0.98   < > 0.79 0.71 0.73 0.7 0.9  CALCIUM 8.9   < > 8.4*   < > 8.7*   < > 8.8*  --  8.7* 9.0 8.7* 9.3 8.8  MG 2.3  --  2.2  --  2.0  --  2.1  --   --  1.9 1.9  --   --   PHOS 3.4  --  3.0   --  4.1  --   --   --   --   --   --   --   --    < > = values in this interval not displayed.   Recent Labs    01/22/21 0651 01/23/21 0259 03/26/21 0000 07/06/21 1605  AST 39 28 23 33  ALT 38 34 31 45*  ALKPHOS 68 57 67 76  BILITOT 1.3* 1.5*  --  0.7  PROT 6.1* 5.6*  --  6.2*  ALBUMIN 3.4* 3.1* 3.3* 3.2*   Recent Labs    01/22/21 0651 01/22/21 0658 02/02/21 0519 03/26/21 0000 07/06/21 1605 07/06/21 1612 07/08/21 0717 07/09/21 0317 07/11/21 0000  WBC 10.7*   < > 9.3   < > 9.3  --  9.8 9.5 8.3  NEUTROABS 7.7  --  7.2  --  6.5  --   --   --   --   HGB 15.5*   < > 14.4   < > 14.6   < > 14.9 14.3 14.1  HCT 48.4*   < > 45.3   < > 45.2   < > 45.4 43.7 42  MCV 96.4   < > 95.4  --  93.0  --  91.2 90.7  --   PLT 237   < > 286   < > 241  --  235 237 224   < > = values in this interval not displayed.   Lab Results  Component Value Date   TSH 0.632 07/06/2021   Lab Results  Component Value Date   HGBA1C 6.0 (H) 11/13/2020   Lab Results  Component Value Date   CHOL 209 (H) 11/13/2020   HDL 70 11/13/2020   LDLCALC 120 (H) 11/13/2020   TRIG 97 11/13/2020   CHOLHDL 3.0 11/13/2020    Significant Diagnostic Results in last 30 days:  DG Chest 2 View  Result Date: 10/08/2021 CLINICAL DATA:  RML pneumonia EXAM: CHEST - 2 VIEW COMPARISON:  July 06, 2021. FINDINGS: Ill-defined peripheral right midlung and basilar opacities. No visible pneumothorax. Small right pleural effusion. Similar cardiomediastinal silhouette. Osteopenia. Polyarticular degenerative change. IMPRESSION: Ill-defined right basilar and mid lung peripheral opacities, which  could represent atelectasis and/or pneumonia. Small right pleural effusion. Electronically Signed   By: Feliberto Harts M.D.   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, unspecified laterality (HCC) Continue Therapy    Gastroesophageal reflux disease without esophagitis On Protonix   Paroxysmal atrial fibrillation (HCC) On Amiodarone and Eliquis   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HCC) On Lasix PRN   Mixed hyperlipidemia On Zetia Allergic to statin   Essential hypertension  Norvasc    Depression, recurrent (HCC) Doing well on Wellbutrin Bradycardia Better off metoprolol  Family/ staff Communication:   Labs/tests ordered:    Total time spent in this patient care encounter was  45_  minutes; greater than 50% of the visit spent counseling patient and staff, reviewing records , Labs and coordinating care for problems addressed at this encounter.

## 2021-11-05 NOTE — Progress Notes (Signed)
Subjective:   Patient ID: Beverly Li, female   DOB: 76 y.o.   MRN: [id number]   HPI Patient states she is developed a lot of pain in the bottom of her right heel and she is recovering from a stroke currently.  The patient has good digital perfusion she is not well oriented currently does not smoke and is not active   Review of Systems  All other systems reviewed and are negative.       Objective:  Physical Exam Vitals and nursing note reviewed.  Constitutional:      Appearance: She is well-developed.  Pulmonary:     Effort: Pulmonary effort is normal.  Musculoskeletal:        General: Normal range of motion.  Skin:    General: Skin is warm.  Neurological:     Mental Status: She is alert.     Neurovascular status found to be intact muscle strength was found to be reduced on the right side secondary to stroke with patient found to have discomfort of a moderate nature plantar aspect right heel at the insertional point of the tendon into the calcaneus with inflammation fluid in the medial band.  Patient is noted to have good digital perfusion well oriented x3     Assessment:  Acute Planter fasciitis right with inflammation fluid of the medial band     Plan:  H&P reviewed condition and went ahead today and did sterile prep and I went ahead and I injected the fascia 3 mg Kenalog 5 mg Xylocaine instructed on supportive shoes and if symptoms persist we will have to try other modalities to get a better  X-rays indicate small spur no indications of stress fracture advanced arthritis

## 2021-11-06 ENCOUNTER — Telehealth: Payer: Self-pay | Admitting: Cardiology

## 2021-11-06 NOTE — Telephone Encounter (Signed)
Calling to see if the appt on Thursday (7/25) is needed. Please advise

## 2021-11-06 NOTE — Telephone Encounter (Signed)
Spoke with pt's husband, DPR and advised pt last saw Dr Anne Fu 02/2021 and he requested pt to be seen again in 6 months.  Pt's husband verbalizes understanding and agrees with current plan.

## 2021-11-07 DIAGNOSIS — J9601 Acute respiratory failure with hypoxia: Secondary | ICD-10-CM | POA: Diagnosis not present

## 2021-11-07 DIAGNOSIS — I69351 Hemiplegia and hemiparesis following cerebral infarction affecting right dominant side: Secondary | ICD-10-CM | POA: Diagnosis not present

## 2021-11-07 DIAGNOSIS — I63312 Cerebral infarction due to thrombosis of left middle cerebral artery: Secondary | ICD-10-CM | POA: Diagnosis not present

## 2021-11-07 DIAGNOSIS — I5032 Chronic diastolic (congestive) heart failure: Secondary | ICD-10-CM | POA: Diagnosis not present

## 2021-11-07 DIAGNOSIS — R001 Bradycardia, unspecified: Secondary | ICD-10-CM | POA: Diagnosis not present

## 2021-11-07 DIAGNOSIS — R2689 Other abnormalities of gait and mobility: Secondary | ICD-10-CM | POA: Diagnosis not present

## 2021-11-07 DIAGNOSIS — M6389 Disorders of muscle in diseases classified elsewhere, multiple sites: Secondary | ICD-10-CM | POA: Diagnosis not present

## 2021-11-07 DIAGNOSIS — R278 Other lack of coordination: Secondary | ICD-10-CM | POA: Diagnosis not present

## 2021-11-08 ENCOUNTER — Ambulatory Visit: Payer: Medicare Other | Admitting: Cardiology

## 2021-11-08 ENCOUNTER — Encounter: Payer: Self-pay | Admitting: Cardiology

## 2021-11-08 VITALS — BP 134/80 | HR 66 | Ht 63.0 in

## 2021-11-08 DIAGNOSIS — I480 Paroxysmal atrial fibrillation: Secondary | ICD-10-CM

## 2021-11-08 DIAGNOSIS — I421 Obstructive hypertrophic cardiomyopathy: Secondary | ICD-10-CM

## 2021-11-08 DIAGNOSIS — I10 Essential (primary) hypertension: Secondary | ICD-10-CM

## 2021-11-08 NOTE — Patient Instructions (Signed)
Medication Instructions:  The current medical regimen is effective;  continue present plan and medications.  *If you need a refill on your cardiac medications before your next appointment, please call your pharmacy*  Follow-Up: At [facility], you and your health needs are our priority.  As part of our continuing mission to provide you with exceptional heart care, we have created designated Provider Care Teams.  These Care Teams include your primary Cardiologist (physician) and Advanced Practice Providers (APPs -  Physician Assistants and Nurse Practitioners) who all work together to provide you with the care you need, when you need it.  We recommend signing up for the patient portal called "MyChart".  Sign up information is provided on this After Visit Summary.  MyChart is used to connect with patients for Virtual Visits (Telemedicine).  Patients are able to view lab/test results, encounter notes, upcoming appointments, etc.  Non-urgent messages can be sent to your provider as well.   To learn more about what you can do with MyChart, go to [email or web address].[email or web address].    Your next appointment:   6 month(s)  The format for your next appointment:   In Person  Provider:   Mark Skains, MD {   Important Information About Sugar       

## 2021-11-08 NOTE — Telephone Encounter (Signed)
no

## 2021-11-08 NOTE — Progress Notes (Signed)
Cardiology Office Note:    Date:  11/08/2021   ID:  SHAYLEN MOWRY, DOB 1946/02/23, MRN [id number]  PCP:  Virgie Dad, MD   [facility] Providers Cardiologist:  Candee Furbish, MD Electrophysiologist:  Virl Axe, MD     Referring MD: Virgie Dad, MD    History of Present Illness:    Beverly Li is a 76 y.o. female follow-up for hypertrophic obstructive cardiomyopathy.  In 2016 during femoropopliteal bypass she developed significant hypotension obstruction respiratory arrest was placed on phenylephrine intubated for several days then finally self extubated.  2018 underwent cardiac catheterization no CAD that was significant was found.  She had significant postprocedural hypertension and received hydralazine then became bradycardic and hypotensive again due to obstructive symptoms.  Received phenylephrine.  Did not require intubation.  She had atrial fibrillation which also inhibited her overall cardiac output given her obstructive symptoms.  She was previously taking dofetilide for this but now is on amiodarone followed by Dr. Caryl Comes.  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  She had pneumonia.  Few weeks later had a second stroke.  In March 2023 she had bradycardia which improved following discontinuing of longstanding metoprolol.  We continued with the amiodarone.  Has been maintained on Eliquis.  Past Medical History:  Diagnosis Date   Anxiety    Arthritis    "some in my lower back; probably elbows, knees" (11/18/2017)   Atrial fibrillation (HCC)    Bell's palsy    when pt. was 76 yrs old, when under stress the left side of face will droop.   Complication of anesthesia    "vascular OR 2016; BP bottomed out; couldn't get it regulated; ended up in ICU for DAYS" (11/18/2017)   GERD (gastroesophageal reflux disease)    History of  kidney stones    Hypertension    Hypertrophic cardiomyopathy (HCC)    severe LV basilar hypertrophy witn no evidence of significant outflow tract obstruction, EF 65-70%, mild LAE, mild TR, grade 1a diastolic dysfunction [id number] (Dr. Candee Furbish) (Atrial Septal Hypertrophy pattern)-- Intra-op TEE with dsignificant outflow tract obstruction - AI, MR & TR   Insomnia    Mild aortic sclerosis    Osteopenia    Peripheral vascular disease (HCC)    Syncope    , Vagal    Past Surgical History:  Procedure Laterality Date   AUGMENTATION MAMMAPLASTY Bilateral    BACK SURGERY     CARDIAC CATHETERIZATION N/A 05/07/2016   Procedure: Left Heart Cath and Coronary Angiography;  Surgeon: Burnell Blanks, MD;  Location: [place] [facility];  Service: Cardiovascular;  Laterality: N/A;   CARDIOVERSION N/A 09/24/2017   Procedure: CARDIOVERSION;  Surgeon: Larey Dresser, MD;  Location: [facility] ENDOSCOPY;  Service: Cardiovascular;  Laterality: N/A;   DILATION AND CURETTAGE OF UTERUS     ENDARTERECTOMY FEMORAL Right 03/02/2015   Procedure: ENDARTERECTOMY RIGHT FEMORAL;  Surgeon: Angelia Mould, MD;  Location: [facility] OR;  Service: Vascular;  Laterality: Right;   ESOPHAGOGASTRODUODENOSCOPY (EGD) WITH PROPOFOL N/A 12/01/2020   Procedure: ESOPHAGOGASTRODUODENOSCOPY (EGD) WITH PROPOFOL;  Surgeon: Jesusita Oka, MD;  Location: [place] ENDOSCOPY;  Service: General;  Laterality: N/A;   FACIAL COSMETIC SURGERY Left 2002   "related to [place] @ age 74; left eye/side of face droopy; tried to make area symmetrical"   FEMORAL-POPLITEAL BYPASS GRAFT Right 03/02/2015   Procedure: BYPASS  GRAFT FEMORAL-BELOW KNEE POPLITEAL ARTERY;  Surgeon: Chuck Hint, MD;  Location: [facility] OR;  Service: Vascular;  Laterality: Right;   INGUINAL HERNIA REPAIR Bilateral 2002   IR ANGIO INTRA EXTRACRAN SEL COM CAROTID INNOMINATE UNI L MOD SED  11/16/2020   IR CT HEAD LTD  11/13/2020   IR PERCUTANEOUS ART THROMBECTOMY/INFUSION  INTRACRANIAL INC DIAG ANGIO  11/13/2020   OVARIAN CYST REMOVAL Left    PEG PLACEMENT N/A 12/01/2020   Procedure: PERCUTANEOUS ENDOSCOPIC GASTROSTOMY (PEG) PLACEMENT;  Surgeon: Diamantina Monks, MD;  Location: [facility] ENDOSCOPY;  Service: General;  Laterality: N/A;   PERIPHERAL VASCULAR CATHETERIZATION N/A 01/16/2015   Procedure: Abdominal Aortogram;  Surgeon: Chuck Hint, MD;  Location: [facility] INVASIVE CV LAB;  Service: Cardiovascular;  Laterality: N/A;   POSTERIOR LUMBAR FUSION  2015   "have plates and screws in there"   RADIOLOGY WITH ANESTHESIA N/A 11/13/2020   Procedure: IR WITH ANESTHESIA;  Surgeon: Radiologist, Medication, MD;  Location: [facility] OR;  Service: Radiology;  Laterality: N/A;   TONSILLECTOMY      Current Medications: Current Meds  Medication Sig   aluminum-magnesium hydroxide-simethicone (MAALOX) 200-200-20 MG/5ML SUSP Take 30 mLs by mouth every 4 (four) hours as needed (heartburn).   amiodarone (PACERONE) 200 MG tablet Take 200 mg by mouth daily. May give crushed   amLODipine (NORVASC) 5 MG tablet Take 5 mg by mouth daily.   apixaban (ELIQUIS) 5 MG TABS tablet Take 5 mg by mouth 2 (two) times daily. May give crushed   bisacodyl (DULCOLAX) 5 MG EC tablet Take 5 mg by mouth 3 (three) times daily as needed for moderate constipation.   buPROPion ER (WELLBUTRIN SR) 100 MG 12 hr tablet Take 100 mg by mouth 2 (two) times daily.   diclofenac Sodium (VOLTAREN) 1 % GEL Apply topically as needed (Apply to right hip).   ezetimibe (ZETIA) 10 MG tablet Take 10 mg by mouth daily. Oral if crushed   furosemide (LASIX) 20 MG tablet Take 20 mg by mouth daily as needed. Give Lasix 20mg  for weight gain equal or greater than 3 pounds   hydrALAZINE (APRESOLINE) 10 MG tablet Take 10 mg by mouth 3 (three) times daily as needed (SBP > 165 or DBP > 95).   ipratropium-albuterol (DUONEB) 0.5-2.5 (3) MG/3ML SOLN Inhale 3 mLs into the lungs every 6 (six) hours as needed.   ketoconazole (NIZORAL) 2 % cream Apply  1 application  topically daily as needed for irritation.   nystatin cream (MYCOSTATIN) Apply 1 application topically 2 (two) times daily as needed for dry skin (apply to perirectal rash).   ondansetron (ZOFRAN) 4 MG tablet Take 4 mg by mouth every 6 (six) hours as needed for nausea or vomiting.   OXYGEN Inhale 3 L into the lungs as directed. to maintain sats > or equal to 90%. May titrate Three Times A Day; 07:00 AM - 03:00 PM, 03:00 PM - 11:00 PM, 11:00 PM - 7:00 am   pantoprazole (PROTONIX) 40 MG tablet Take 40 mg by mouth daily.   Polyethyl Glycol-Propyl Glycol (SYSTANE) 0.4-0.3 % SOLN Place 2 drops into both eyes 4 (four) times daily as needed (irritation).   Tiotropium Bromide-Olodaterol (STIOLTO RESPIMAT) 2.5-2.5 MCG/ACT AERS Inhale 2 puffs into the lungs daily.   zinc oxide 20 % ointment Apply 1 application. topically 2 (two) times daily as needed for irritation (apply to perirectal area prn - use with nystatin).   Current Facility-Administered Medications for the 11/08/21 encounter (Office Visit) with 11/10/21  C, MD  Medication   triamcinolone acetonide (KENALOG) 10 MG/ML injection 10 mg     Allergies:   Amoxicillin, Atenolol, Crestor [rosuvastatin calcium], Pravastatin, Sulfa antibiotics, and Codeine   Social History   Socioeconomic History   Marital status: Married    Spouse name: Not on file   Number of children: Not on file   Years of education: Not on file   Highest education level: Not on file  Occupational History   Not on file  Tobacco Use   Smoking status: Former    Packs/day: 1.00    Years: 50.00    Total pack years: 50.00    Types: Cigarettes    Quit date: 12/15/2014    Years since quitting: 6.9   Smokeless tobacco: Never  Vaping Use   Vaping Use: Never used  Substance and Sexual Activity   Alcohol use: Yes    Comment: 11/18/2017 "might have a couple glasses of wine/month; if that"   Drug use: Not on file   Sexual activity: Not Currently  Other Topics  Concern   Not on file  Social History Narrative   Not on file   Social Determinants of Health   Financial Resource Strain: Not on file  Food Insecurity: Not on file  Transportation Needs: Not on file  Physical Activity: Not on file  Stress: Not on file  Social Connections: Not on file     Family History: The patient's family history includes Breast cancer in her sister; Cancer in her father, mother, and sister; Hypertension in her mother; Liver cancer in her mother; Lung cancer in her father.  ROS:   Please see the history of present illness.     All other systems reviewed and are negative.  EKGs/Labs/Other Studies Reviewed:    The following studies were reviewed today: Cardiac catheterization 2018: Mid RCA lesion, 20 %stenosed. 2nd Mrg lesion, 20 %stenosed. Prox Cx to Mid Cx lesion, 30 %stenosed. Mid LAD lesion, 60 %stenosed.   1. Mild to moderate non-obstructive CAD 2. The LAD is a large caliber vessel that reaches the apex. The mid LAD has a moderate eccentric stenosis just after the moderate caliber diagonal branch. Angiographically this appears to be moderate.  FFR of this lesion suggests the stenosis is not flow limiting. (FFR 0.82-0.85).  3. The RCA and Circumflex have minor disease.  4. Abnormal LV pressures c/w HOCM.    Recommendations: Medical management of moderate non-obstructive CAD.   Cardiac MRI 2018: 1) Hypertrophic cardiomyopathy with basal septum measuring 21 mm and spade-like ventrical   2) Hyperenhancement in the basal septum and apex   3) No frank SAM but small LVOT with peak recorded gradient 11 mmHg suggest echo correlation   4) Tri-leaflet aortic valve area by planimetry 2.0 cm2   5) Normal aortic root   6) Moderate LAE   7) Mild MR   8) Normal RV   Echocardiogram 07/08/2021: 1. Left ventricular ejection fraction, by estimation, is 60 to 65%. Left  ventricular ejection fraction by 3D volume is 65 %. The left ventricle has  normal  function. The left ventricle has no regional wall motion  abnormalities. There is moderate  asymmetric left ventricular hypertrophy of the basal-septal segment. Left  ventricular diastolic parameters are consistent with Grade II diastolic  dysfunction (pseudonormalization). Elevated left atrial pressure.   2. Right ventricular systolic function is normal. The right ventricular  size is normal. There is normal pulmonary artery systolic pressure. The  estimated right ventricular systolic pressure  is 21.8 mmHg.   3. Left atrial size was moderately dilated.   4. The mitral valve is degenerative. Trivial mitral valve regurgitation.  No evidence of mitral stenosis. Moderate mitral annular calcification.   5. The aortic valve is tricuspid. Aortic valve regurgitation is moderate.  Aortic valve sclerosis/calcification is present, without any evidence of  aortic stenosis.   6. Pulmonic valve regurgitation is mild to moderate.   7. The inferior vena cava is normal in size with greater than 50%  respiratory variability, suggesting right atrial pressure of 3 mmHg.   Long-term monitor 07/31/2021: Paroxysmal atrial fibrillation, 44% burden. Longest episode lasted 5 days with an average heart rate of 88 bpm. No pauses Predominant underlying rhythm was sinus 74 bpm. Rare PACs PVCs No significant prolonged episodes of bradycardia.    EKG:  EKG is  ordered today.  The ekg ordered today demonstrates sinus rhythm first-degree AV block 66 bpm with left atrial enlargement left axis deviation/left anterior fascicular block with nonspecific ST-T wave changes.  No significant change from prior. Sinus rhythm 74 left axis deviation left atrial enlargement Recent Labs: 02/02/2021: B Natriuretic Peptide 1,459.3 07/06/2021: ALT 45; TSH 0.632 07/09/2021: Magnesium 1.9 07/11/2021: Hemoglobin 14.1; Platelets 224 09/18/2021: BUN 32; Creatinine 0.9; Potassium 5.1; Sodium 135  Recent Lipid Panel    Component Value  Date/Time   CHOL 209 (H) 11/13/2020 1930   CHOL 211 (H) 09/16/2016 1200   TRIG 97 11/13/2020 1930   HDL 70 11/13/2020 1930   HDL 97 09/16/2016 1200   CHOLHDL 3.0 11/13/2020 1930   VLDL 19 11/13/2020 1930   LDLCALC 120 (H) 11/13/2020 1930   LDLCALC 98 09/16/2016 1200     Risk Assessment/Calculations:              Physical Exam:    VS:  BP 134/80   Pulse 66   Ht 5\' 3"  (1.6 m)   LMP  (LMP Unknown)   SpO2 92%   BMI 18.07 kg/m     Wt Readings from Last 3 Encounters:  11/05/21 102 lb (46.3 kg)  11/01/21 102 lb (46.3 kg)  10/29/21 100 lb (45.4 kg)     GEN: In wheelchair well developed in no acute distress HEENT: Normal NECK: No JVD; No carotid bruits LYMPHATICS: No lymphadenopathy CARDIAC: RRR, no murmurs, no rubs, gallops RESPIRATORY:  Clear to auscultation without rales, wheezing or rhonchi  ABDOMEN: Soft, non-tender, non-distended MUSCULOSKELETAL:  No edema; minor contractures SKIN: Warm and dry NEUROLOGIC:  Alert and oriented x 3, speech improving PSYCHIATRIC:  Normal affect   ASSESSMENT:    1. 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HCC)   2. Paroxysmal atrial fibrillation ([place])   3. Essential hypertension    PLAN:    In order of problems listed above:  Hypertrophic obstructive cardiomyopathy - See note above for details.  Continue with amiodarone for rhythm control of atrial fibrillation.  No metoprolol.  Doing well.  Paroxysmal atrial fibrillation - Excellent on amiodarone.  Maintaining sinus rhythm.  Had had paroxysmal atrial fibrillation on long-term monitor with rates in the 80s.  Well-controlled.  Currently in sinus rhythm.  Excellent.  Chronic anticoagulation - Continue with Eliquis.  History of stroke - Hemiparesis.  At [facility].  Was able to complete some physical therapy walking exercises.  Excellent.  Speech improving.       Medication Adjustments/Labs and Tests Ordered: Current medicines are reviewed at length with the  patient today.  Concerns regarding medicines are outlined above.  Orders Placed This Encounter  Procedures   EKG 12-Lead   No orders of the defined types were placed in this encounter.   Patient Instructions  Medication Instructions:  The current medical regimen is effective;  continue present plan and medications.  *If you need a refill on your cardiac medications before your next appointment, please call your pharmacy*  Follow-Up: At [facility], you and your health needs are our priority.  As part of our continuing mission to provide you with exceptional heart care, we have created designated Provider Care Teams.  These Care Teams include your primary Cardiologist (physician) and Advanced Practice Providers (APPs -  Physician Assistants and Nurse Practitioners) who all work together to provide you with the care you need, when you need it.  We recommend signing up for the patient portal called "MyChart".  Sign up information is provided on this After Visit Summary.  MyChart is used to connect with patients for Virtual Visits (Telemedicine).  Patients are able to view lab/test results, encounter notes, upcoming appointments, etc.  Non-urgent messages can be sent to your provider as well.   To learn more about what you can do with MyChart, go to [email or web address].    Your next appointment:   6 month(s)  The format for your next appointment:   In Person  Provider:   Donato Schultz, MD {   Important Information About Sugar         Signed, Donato Schultz, MD  11/08/2021 10:04 AM    [facility]

## 2021-11-09 DIAGNOSIS — Z1231 Encounter for screening mammogram for malignant neoplasm of breast: Secondary | ICD-10-CM | POA: Diagnosis not present

## 2021-11-09 DIAGNOSIS — J9601 Acute respiratory failure with hypoxia: Secondary | ICD-10-CM | POA: Diagnosis not present

## 2021-11-09 DIAGNOSIS — R2689 Other abnormalities of gait and mobility: Secondary | ICD-10-CM | POA: Diagnosis not present

## 2021-11-09 DIAGNOSIS — I63312 Cerebral infarction due to thrombosis of left middle cerebral artery: Secondary | ICD-10-CM | POA: Diagnosis not present

## 2021-11-09 DIAGNOSIS — I69351 Hemiplegia and hemiparesis following cerebral infarction affecting right dominant side: Secondary | ICD-10-CM | POA: Diagnosis not present

## 2021-11-09 DIAGNOSIS — I5032 Chronic diastolic (congestive) heart failure: Secondary | ICD-10-CM | POA: Diagnosis not present

## 2021-11-09 LAB — HM MAMMOGRAPHY

## 2021-11-12 ENCOUNTER — Encounter: Payer: Self-pay | Admitting: Internal Medicine

## 2021-11-12 DIAGNOSIS — R001 Bradycardia, unspecified: Secondary | ICD-10-CM | POA: Diagnosis not present

## 2021-11-12 DIAGNOSIS — J9601 Acute respiratory failure with hypoxia: Secondary | ICD-10-CM | POA: Diagnosis not present

## 2021-11-12 DIAGNOSIS — R293 Abnormal posture: Secondary | ICD-10-CM | POA: Diagnosis not present

## 2021-11-12 DIAGNOSIS — I480 Paroxysmal atrial fibrillation: Secondary | ICD-10-CM | POA: Diagnosis not present

## 2021-11-12 DIAGNOSIS — I6602 Occlusion and stenosis of left middle cerebral artery: Secondary | ICD-10-CM | POA: Diagnosis not present

## 2021-11-12 DIAGNOSIS — R482 Apraxia: Secondary | ICD-10-CM | POA: Diagnosis not present

## 2021-11-12 DIAGNOSIS — M62561 Muscle wasting and atrophy, not elsewhere classified, right lower leg: Secondary | ICD-10-CM | POA: Diagnosis not present

## 2021-11-12 DIAGNOSIS — I69351 Hemiplegia and hemiparesis following cerebral infarction affecting right dominant side: Secondary | ICD-10-CM | POA: Diagnosis not present

## 2021-11-12 DIAGNOSIS — R278 Other lack of coordination: Secondary | ICD-10-CM | POA: Diagnosis not present

## 2021-11-12 DIAGNOSIS — M6389 Disorders of muscle in diseases classified elsewhere, multiple sites: Secondary | ICD-10-CM | POA: Diagnosis not present

## 2021-11-12 DIAGNOSIS — R2689 Other abnormalities of gait and mobility: Secondary | ICD-10-CM | POA: Diagnosis not present

## 2021-11-12 DIAGNOSIS — R471 Dysarthria and anarthria: Secondary | ICD-10-CM | POA: Diagnosis not present

## 2021-11-12 DIAGNOSIS — I5032 Chronic diastolic (congestive) heart failure: Secondary | ICD-10-CM | POA: Diagnosis not present

## 2021-11-12 DIAGNOSIS — I63312 Cerebral infarction due to thrombosis of left middle cerebral artery: Secondary | ICD-10-CM | POA: Diagnosis not present

## 2021-11-14 DIAGNOSIS — R001 Bradycardia, unspecified: Secondary | ICD-10-CM | POA: Diagnosis not present

## 2021-11-14 DIAGNOSIS — I63312 Cerebral infarction due to thrombosis of left middle cerebral artery: Secondary | ICD-10-CM | POA: Diagnosis not present

## 2021-11-14 DIAGNOSIS — R2689 Other abnormalities of gait and mobility: Secondary | ICD-10-CM | POA: Diagnosis not present

## 2021-11-14 DIAGNOSIS — R278 Other lack of coordination: Secondary | ICD-10-CM | POA: Diagnosis not present

## 2021-11-14 DIAGNOSIS — M6389 Disorders of muscle in diseases classified elsewhere, multiple sites: Secondary | ICD-10-CM | POA: Diagnosis not present

## 2021-11-14 DIAGNOSIS — I69351 Hemiplegia and hemiparesis following cerebral infarction affecting right dominant side: Secondary | ICD-10-CM | POA: Diagnosis not present

## 2021-11-14 DIAGNOSIS — I5032 Chronic diastolic (congestive) heart failure: Secondary | ICD-10-CM | POA: Diagnosis not present

## 2021-11-14 DIAGNOSIS — M62561 Muscle wasting and atrophy, not elsewhere classified, right lower leg: Secondary | ICD-10-CM | POA: Diagnosis not present

## 2021-11-14 DIAGNOSIS — J9601 Acute respiratory failure with hypoxia: Secondary | ICD-10-CM | POA: Diagnosis not present

## 2021-11-14 DIAGNOSIS — R293 Abnormal posture: Secondary | ICD-10-CM | POA: Diagnosis not present

## 2021-11-15 DIAGNOSIS — R278 Other lack of coordination: Secondary | ICD-10-CM | POA: Diagnosis not present

## 2021-11-15 DIAGNOSIS — R001 Bradycardia, unspecified: Secondary | ICD-10-CM | POA: Diagnosis not present

## 2021-11-15 DIAGNOSIS — I69351 Hemiplegia and hemiparesis following cerebral infarction affecting right dominant side: Secondary | ICD-10-CM | POA: Diagnosis not present

## 2021-11-15 DIAGNOSIS — R482 Apraxia: Secondary | ICD-10-CM | POA: Diagnosis not present

## 2021-11-15 DIAGNOSIS — I63312 Cerebral infarction due to thrombosis of left middle cerebral artery: Secondary | ICD-10-CM | POA: Diagnosis not present

## 2021-11-15 DIAGNOSIS — I6602 Occlusion and stenosis of left middle cerebral artery: Secondary | ICD-10-CM | POA: Diagnosis not present

## 2021-11-15 DIAGNOSIS — M6389 Disorders of muscle in diseases classified elsewhere, multiple sites: Secondary | ICD-10-CM | POA: Diagnosis not present

## 2021-11-15 DIAGNOSIS — R471 Dysarthria and anarthria: Secondary | ICD-10-CM | POA: Diagnosis not present

## 2021-11-15 DIAGNOSIS — I480 Paroxysmal atrial fibrillation: Secondary | ICD-10-CM | POA: Diagnosis not present

## 2021-11-16 DIAGNOSIS — I480 Paroxysmal atrial fibrillation: Secondary | ICD-10-CM | POA: Diagnosis not present

## 2021-11-16 DIAGNOSIS — I63312 Cerebral infarction due to thrombosis of left middle cerebral artery: Secondary | ICD-10-CM | POA: Diagnosis not present

## 2021-11-16 DIAGNOSIS — R2689 Other abnormalities of gait and mobility: Secondary | ICD-10-CM | POA: Diagnosis not present

## 2021-11-16 DIAGNOSIS — I5032 Chronic diastolic (congestive) heart failure: Secondary | ICD-10-CM | POA: Diagnosis not present

## 2021-11-16 DIAGNOSIS — J9601 Acute respiratory failure with hypoxia: Secondary | ICD-10-CM | POA: Diagnosis not present

## 2021-11-16 DIAGNOSIS — R471 Dysarthria and anarthria: Secondary | ICD-10-CM | POA: Diagnosis not present

## 2021-11-16 DIAGNOSIS — R482 Apraxia: Secondary | ICD-10-CM | POA: Diagnosis not present

## 2021-11-16 DIAGNOSIS — I69351 Hemiplegia and hemiparesis following cerebral infarction affecting right dominant side: Secondary | ICD-10-CM | POA: Diagnosis not present

## 2021-11-16 DIAGNOSIS — I6602 Occlusion and stenosis of left middle cerebral artery: Secondary | ICD-10-CM | POA: Diagnosis not present

## 2021-11-19 DIAGNOSIS — I5032 Chronic diastolic (congestive) heart failure: Secondary | ICD-10-CM | POA: Diagnosis not present

## 2021-11-19 DIAGNOSIS — R278 Other lack of coordination: Secondary | ICD-10-CM | POA: Diagnosis not present

## 2021-11-19 DIAGNOSIS — R001 Bradycardia, unspecified: Secondary | ICD-10-CM | POA: Diagnosis not present

## 2021-11-19 DIAGNOSIS — R293 Abnormal posture: Secondary | ICD-10-CM | POA: Diagnosis not present

## 2021-11-19 DIAGNOSIS — I69351 Hemiplegia and hemiparesis following cerebral infarction affecting right dominant side: Secondary | ICD-10-CM | POA: Diagnosis not present

## 2021-11-19 DIAGNOSIS — J9601 Acute respiratory failure with hypoxia: Secondary | ICD-10-CM | POA: Diagnosis not present

## 2021-11-19 DIAGNOSIS — M6389 Disorders of muscle in diseases classified elsewhere, multiple sites: Secondary | ICD-10-CM | POA: Diagnosis not present

## 2021-11-19 DIAGNOSIS — R2689 Other abnormalities of gait and mobility: Secondary | ICD-10-CM | POA: Diagnosis not present

## 2021-11-19 DIAGNOSIS — I63312 Cerebral infarction due to thrombosis of left middle cerebral artery: Secondary | ICD-10-CM | POA: Diagnosis not present

## 2021-11-19 DIAGNOSIS — M62561 Muscle wasting and atrophy, not elsewhere classified, right lower leg: Secondary | ICD-10-CM | POA: Diagnosis not present

## 2021-11-20 DIAGNOSIS — I480 Paroxysmal atrial fibrillation: Secondary | ICD-10-CM | POA: Diagnosis not present

## 2021-11-20 DIAGNOSIS — R482 Apraxia: Secondary | ICD-10-CM | POA: Diagnosis not present

## 2021-11-20 DIAGNOSIS — I6602 Occlusion and stenosis of left middle cerebral artery: Secondary | ICD-10-CM | POA: Diagnosis not present

## 2021-11-20 DIAGNOSIS — R471 Dysarthria and anarthria: Secondary | ICD-10-CM | POA: Diagnosis not present

## 2021-11-21 ENCOUNTER — Encounter: Payer: Self-pay | Admitting: Adult Health

## 2021-11-21 ENCOUNTER — Ambulatory Visit: Payer: Medicare Other | Admitting: Adult Health

## 2021-11-21 VITALS — BP 126/72 | HR 53 | Ht 63.0 in | Wt 100.0 lb

## 2021-11-21 DIAGNOSIS — R471 Dysarthria and anarthria: Secondary | ICD-10-CM | POA: Diagnosis not present

## 2021-11-21 DIAGNOSIS — I6522 Occlusion and stenosis of left carotid artery: Secondary | ICD-10-CM | POA: Diagnosis not present

## 2021-11-21 DIAGNOSIS — I69351 Hemiplegia and hemiparesis following cerebral infarction affecting right dominant side: Secondary | ICD-10-CM | POA: Diagnosis not present

## 2021-11-21 DIAGNOSIS — I6521 Occlusion and stenosis of right carotid artery: Secondary | ICD-10-CM

## 2021-11-21 DIAGNOSIS — I6602 Occlusion and stenosis of left middle cerebral artery: Secondary | ICD-10-CM | POA: Diagnosis not present

## 2021-11-21 DIAGNOSIS — I5032 Chronic diastolic (congestive) heart failure: Secondary | ICD-10-CM | POA: Diagnosis not present

## 2021-11-21 DIAGNOSIS — R482 Apraxia: Secondary | ICD-10-CM | POA: Diagnosis not present

## 2021-11-21 DIAGNOSIS — I63312 Cerebral infarction due to thrombosis of left middle cerebral artery: Secondary | ICD-10-CM | POA: Diagnosis not present

## 2021-11-21 DIAGNOSIS — J9601 Acute respiratory failure with hypoxia: Secondary | ICD-10-CM | POA: Diagnosis not present

## 2021-11-21 DIAGNOSIS — I480 Paroxysmal atrial fibrillation: Secondary | ICD-10-CM | POA: Diagnosis not present

## 2021-11-21 DIAGNOSIS — R2689 Other abnormalities of gait and mobility: Secondary | ICD-10-CM | POA: Diagnosis not present

## 2021-11-21 NOTE — Patient Instructions (Signed)
Continue working with therapies - keep up the great work!!!   You will be called to schedule an ultrasound of your neck - I will refer you to see Dr. Edilia Bo depending on results  Continue to follow with cardiology as scheduled  Continue  Eliquis   and Zetia  for secondary stroke prevention  Continue to follow up with PCP regarding cholesterol and blood pressure management - request repeat cholesterol levels next time routine lab work is completed Maintain strict control of hypertension with blood pressure goal below 130/90, and cholesterol with LDL cholesterol (bad cholesterol) goal below 70 mg/dL.   Signs of a Stroke? Follow the BEFAST method:  Balance Watch for a sudden loss of balance, trouble with coordination or vertigo Eyes Is there a sudden loss of vision in one or both eyes? Or double vision?  Face: Ask the person to smile. Does one side of the face droop or is it numb?  Arms: Ask the person to raise both arms. Does one arm drift downward? Is there weakness or numbness of a leg? Speech: Ask the person to repeat a simple phrase. Does the speech sound slurred/strange? Is the person confused ? Time: If you observe any of these signs, call 911.    As you have been stable from a stroke standpoint over the past year, we can plan on following up as needed but please do not hesitate to call with any questions or concerns        Thank you for coming to see [place] at [facility]. I hope we have been able to provide you high quality care today.  You may receive a patient satisfaction survey over the next few weeks. We would appreciate your feedback and comments so that we may continue to improve ourselves and the health of our patients.

## 2021-11-21 NOTE — Progress Notes (Signed)
[facility] [address] [place]. [place] [address] [phone]       STROKE FOLLOW UP NOTE  [name] Date of Birth:  10/23/1945 Medical Record Number:  [id number]   Reason for Referral: stroke follow up    SUBJECTIVE:   CHIEF COMPLAINT:  Chief Complaint  Patient presents with   Stroke    Rm 3, husband- Beverly Li FU from Nov 2022 appt "concerned about speech difficulty; husband and son think it's improved dramatically"    HPI:   Update 11/21/2021 JM: Patient returns for routine stroke follow-up accompanied by her husband.  Previously seen 9 months ago and has not followed up since that time.  She continues to reside at [facility].  Has been stable since prior visit without new stroke/TIA symptoms.  Reports residual aphasia and right hemiplegia.  Patient denies any improvement since prior visit although husband believes she has been making progress in regards to speech.  Ambulating short distance with PT.  Dysphagia has resolved and is currently on a regular diet and taking pills by mouth.  She is scheduled for PEG tube removal next week which she is greatly looking forward to.  She continues to routinely work with PT/OT/SLP.  She was found to have pneumonia back in June and eventually saw pulmonology for unresolved multifocal pneumonia and just recently started feeling better.  She plans on repeat chest x-ray next week.  She remains on oxygen via [place].  Per SNF MAR, she has remained on Eliquis and Zetia, denies side effects Blood pressure today 126/72  No further concerns at this time.    History provided for reference purposes only Initial visit 03/06/2021 JM: She is being seen for initial hospital stroke follow-up accompanied by her husband.  She continues to reside at [facility].   Reports making progress more so in regards to speech and swallowing. She does have some movement on right side but seems to be more spontaneous.  She is currently  working with PT/OT/SLP.  She has been walking during therapy sessions with RW.  She is currently maintaining Dys2 NTL diet without difficulty.  Continue to use of PEG for meds and flushes. Husband questions duration of PEG tube. He also asks duration of O2 (which has been present since admission for PNA) - deferred to SNF/PCP. Denies new stroke/TIA symptoms.   Per MAR review, remains on Eliquis and Zetia -denies side effects Blood pressure today 119/71  No further concerns at this time   Stroke admission 11/13/2020 Beverly Li is a 76 y.o. 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  Personally reviewed hospitalization pertinent progress notes, lab work and imaging.  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  CTA head/neck showed left MCA occlusion and age-indeterminate occlusion of proximal right ICA with non-opacification through the carotid terminus.  EF 60 to 65% with LA severely dilated.  LDL 120.  A1c 6.0. cards eval for A fib RVR converted to sinus 8/8 and restarted on Eliquis 8/6. Advised f/u with IR outpatient for severe preocclusive stenosis of proximal right ICA.  Continuation of Zetia 10 mg  daily with history of statin allergy. Eval by [facility] right femoropopliteal bypass occlusion with no intervention recommended and outpatient follow-up.  Residual deficits of global aphasia, nonverbal, dysphagia s/p PEG, left gaze preference, right facial droop and right hemiparesis.  Discharged to SNF for functional decline and therapy needs.  Readmitted 8/30 - 12/21/2020 for aspiration pneumonia with acute  respiratory failure with hypoxia. Admitted from home - therapies rec's CIR for decreased functional ability and eventually d/c'd to SNF on 10/30.  Readmitted on 10/10 - 01/23/2021 for slurred speech and questionable worsening of right side per SNF staff. MR brain showed new/increased diffusion restriction in the basal ganglia, internal and external capsule extending to the carotid body/corona radiata compared to prior study 11/2020 consistent with acute to subacute infarct, additional diffusion restriction in the left MCA and ACA territories consistent with evolving subacute infarct seen on prior study, petechial hemorrhage noted and punctate chronic microhemorrhage in the left superior frontal gyrus.  CTA head/neck stable chronic right ICA occlusion and high-grade proximal left ICA stenosis estimated at 70%.  Hypertensive emergency upon arrival eventually stabilized.  Recommended continue Eliquis and Zetia for secondary stroke prevention.  She returned back to SNF  ER eval 02/02/2021 for likely COPD exacerbation -improvement after steroids and breathing treatment and stable on 3L oxygen. D/c'd back to facility after 12 hours      PERTINENT IMAGING  11/13/20 CT Head WO IV Contrast 1. Mild asymmetric hypodensity of the left insula, concerning for acute left MCA territory infarct. An MRI could further evaluate. 2. Hyperdense appearance of the distal left M1 MCA, concerning for thrombus. Recommend CTA to further evaluate. 3. No acute hemorrhage. 4. Remote lacunar infarcts versus dilated perivascular spaces in bilateral inferior basal ganglia. Suspected remote lacunar infarct in the right cerebellum.   11/13/20 CT Angio Head and Neck W WO IV Contrast 1. Occlusion of the left M1 MCA with very little opacification of distal left MCA branches. Perfusion demonstrates the core infarct of 41 mL in the anterior left MCA territory with large (122 ml) of ischemic phenomena in the remaining left MCA and  potentially left ACA territories. 2. Age indeterminate occlusion of the proximal right internal carotid artery in the neck with non-opacification through the carotid terminus. The right MCA and right posterior communicating artery are opacified via the of the right A1 ACA. 3. Suspected high-grade stenosis versus occlusion of the distal left A3/A4 ACA. 4. Approximately 40-50% stenosis of the proximal left ICA in the neck. 5. Severe stenosis of the non dominant/small right vertebral artery origin. Moderate stenosis of the intradural left vertebral artery. 6. Severe emphysema.   MRI Brain WO IV Contrast 11/14/2020  1. 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 Associated edema without mass effect. 2. Additional scattered punctate acute infarcts in the right frontoparietal cortex and small acute infarct in the right cerebellum.  01/22/2021 IMPRESSION: 1. New/increased diffusion restriction in the left basal ganglia, internal and external capsules, extending to the caudate body/corona radiata compared to the study from 11/14/2020 consistent with acute to subacute infarct. 2. Additional diffusion restriction in the left MCA and ACA territories consistent with evolving subacute infarct as seen on the study from 11/14/2020. 3. Curvilinear SWI signal dropout along the left precentral sulcus consistent with petechial hemorrhage. There is an additional focus of petechial hemorrhage versus punctate chronic microhemorrhage in the left superior frontal gyrus.   11/14/20 Echocardiogram Complete   1. There is no evidence of systolic anterior motion of the mitral  valve or true LV outflow obstruction on this study. LVOT velocities are mildly elevated at 2 m/s. Left ventricular ejection fraction, by estimation, is 60 to 65%. The left ventricle has low normal function. The left ventricle has no regional wall motion abnormalities. There is moderate concentric left ventricular hypertrophy. Left  ventricular diastolic parameters are consistent with Grade II diastolic dysfunction (pseudonormalization). Elevated left atrial pressure.   2. Right ventricular systolic function is normal. The right ventricular size is normal.   3. Left atrial size was severely dilated.   4. Right atrial size was mildly dilated.   5. The mitral valve is degenerative. Mild to moderate mitral valve regurgitation.   6. The aortic valve is tricuspid. There is moderate calcification of the aortic valve. There is mild thickening of the aortic valve. Aortic valve regurgitation is mild. Mild to moderate aortic valve  sclerosis/calcification is present, without any evidence of aortic stenosis.   7. The inferior vena cava is dilated in size with <50% respiratory variability, suggesting right atrial pressure of 15 mmHg.     ROS:   14 system review of systems performed and negative with exception of those listed in HPI  PMH:  Past Medical History:  Diagnosis Date   Anxiety    Arthritis    "some in my lower back; probably elbows, knees" (11/18/2017)   Atrial fibrillation (HCC)    Bell's palsy    when pt. was 75 yrs old, when under stress the left side of face will droop.   Complication of anesthesia    "vascular OR 2016; BP bottomed out; couldn't get it regulated; ended up in ICU for DAYS" (11/18/2017)   GERD (gastroesophageal reflux disease)    History of kidney stones    Hypertension    Hypertrophic cardiomyopathy (HCC)    severe LV basilar hypertrophy witn no evidence of significant outflow tract obstruction, EF 65-70%, mild LAE, mild TR, grade 1a diastolic dysfunction 05/15/10 (Dr. Donato Schultz) (Atrial Septal Hypertrophy pattern)-- Intra-op TEE with dsignificant outflow tract obstruction - AI, MR & TR   Insomnia    Mild aortic sclerosis    Osteopenia    Peripheral vascular disease (HCC)    Syncope    , Vagal    PSH:  Past Surgical History:  Procedure Laterality Date   AUGMENTATION MAMMAPLASTY Bilateral     BACK SURGERY     CARDIAC CATHETERIZATION N/A 05/07/2016   Procedure: Left Heart Cath and Coronary Angiography;  Surgeon: Kathleene Hazel, MD;  Location: [facility] INVASIVE CV LAB;  Service: Cardiovascular;  Laterality: N/A;   CARDIOVERSION N/A 09/24/2017   Procedure: CARDIOVERSION;  Surgeon: Laurey Morale, MD;  Location: [facility] ENDOSCOPY;  Service: Cardiovascular;  Laterality: N/A;   DILATION AND CURETTAGE OF UTERUS     ENDARTERECTOMY FEMORAL Right 03/02/2015   Procedure: ENDARTERECTOMY RIGHT FEMORAL;  Surgeon: Chuck Hint, MD;  Location: [facility] OR;  Service: Vascular;  Laterality: Right;   ESOPHAGOGASTRODUODENOSCOPY (EGD) WITH PROPOFOL N/A 12/01/2020   Procedure: ESOPHAGOGASTRODUODENOSCOPY (EGD) WITH PROPOFOL;  Surgeon: Diamantina Monks, MD;  Location: [facility] ENDOSCOPY;  Service: General;  Laterality: N/A;   FACIAL COSMETIC SURGERY Left 2002   "related to Bell's Palsy @ age 67; left eye/side of face droopy; tried to make area symmetrical"   FEMORAL-POPLITEAL BYPASS GRAFT Right 03/02/2015   Procedure: BYPASS GRAFT FEMORAL-BELOW KNEE POPLITEAL ARTERY;  Surgeon: Chuck Hint, MD;  Location[facility] OR;  Service: Vascular;  Laterality: Right;   INGUINAL HERNIA REPAIR Bilateral 2002   IR  ANGIO INTRA EXTRACRAN SEL COM CAROTID INNOMINATE UNI L MOD SED  11/16/2020   IR CT HEAD LTD  11/13/2020   IR PERCUTANEOUS ART THROMBECTOMY/INFUSION INTRACRANIAL INC DIAG ANGIO  11/13/2020   OVARIAN CYST REMOVAL Left    PEG PLACEMENT N/A 12/01/2020   Procedure: PERCUTANEOUS ENDOSCOPIC GASTROSTOMY (PEG) PLACEMENT;  Surgeon: Jesusita Oka, MD;  Location: [facility];  Service: General;  Laterality: N/A;   PERIPHERAL VASCULAR CATHETERIZATION N/A 01/16/2015   Procedure: Abdominal Aortogram;  Surgeon: Angelia Mould, MD;  Location: [facility] CV LAB;  Service: Cardiovascular;  Laterality: N/A;   POSTERIOR LUMBAR FUSION  2015   "have plates and screws in there"   [place] N/A 11/13/2020    Procedure: IR WITH ANESTHESIA;  Surgeon: Radiologist, Medication, MD;  Location: [place];  Service: Radiology;  Laterality: N/A;   TONSILLECTOMY      Social History:  Social History   Socioeconomic History   Marital status: Married    Spouse name: Engineer, technical sales   Number of children: Not on file   Years of education: Not on file   Highest education level: Not on file  Occupational History   Not on file  Tobacco Use   Smoking status: Former    Packs/day: 1.00    Years: 50.00    Total pack years: 50.00    Types: Cigarettes    Quit date: 12/15/2014    Years since quitting: 6.9   Smokeless tobacco: Never  Vaping Use   Vaping Use: Never used  Substance and Sexual Activity   Alcohol use: Yes    Comment: 11/18/2017 "might have a couple glasses of wine/month; if that"   Drug use: Not on file   Sexual activity: Not Currently  Other Topics Concern   Not on file  Social History Narrative   11/21/21 living at [place] Strain: Not on file  Food Insecurity: Not on file  Transportation Needs: Not on file  Physical Activity: Not on file  Stress: Not on file  Social Connections: Not on file  Intimate Partner Violence: Not on file    Family History:  Family History  Problem Relation Age of Onset   Liver cancer Mother    Cancer Mother        Liver   Hypertension Mother    Lung cancer Father    Cancer Father        Lung   Breast cancer Sister    Cancer Sister        Breast    Medications:   Current Outpatient Medications on File Prior to Visit  Medication Sig Dispense Refill   aluminum-magnesium hydroxide-simethicone (MAALOX) [id number] MG/5ML SUSP Take 30 mLs by mouth every 4 (four) hours as needed (heartburn).     amiodarone (PACERONE) 200 MG tablet Take 200 mg by mouth daily. May give crushed     amLODipine (NORVASC) 5 MG tablet Take 5 mg by mouth daily.     apixaban (ELIQUIS) 5 MG TABS tablet Take 5 mg by mouth 2 (two) times  daily. May give crushed     bisacodyl (DULCOLAX) 5 MG EC tablet Take 5 mg by mouth 3 (three) times daily as needed for moderate constipation.     buPROPion ER (WELLBUTRIN SR) 100 MG 12 hr tablet Take 100 mg by mouth 2 (two) times daily.     diclofenac Sodium (VOLTAREN) 1 % GEL Apply topically as needed (Apply to right hip).  ezetimibe (ZETIA) 10 MG tablet Take 10 mg by mouth daily. Oral if crushed     furosemide (LASIX) 20 MG tablet Take 20 mg by mouth daily as needed. Give Lasix 20mg  for weight gain equal or greater than 3 pounds     hydrALAZINE (APRESOLINE) 10 MG tablet Take 10 mg by mouth 3 (three) times daily as needed (SBP > 165 or DBP > 95).     ipratropium-albuterol (DUONEB) 0.5-2.5 (3) MG/3ML SOLN Inhale 3 mLs into the lungs every 6 (six) hours as needed.     ketoconazole (NIZORAL) 2 % cream Apply 1 application  topically daily as needed for irritation.     nystatin cream (MYCOSTATIN) Apply 1 application topically 2 (two) times daily as needed for dry skin (apply to perirectal rash).     ondansetron (ZOFRAN) 4 MG tablet Take 4 mg by mouth every 6 (six) hours as needed for nausea or vomiting.     OXYGEN Inhale 3 L into the lungs as directed. to maintain sats > or equal to 90%. May titrate Three Times A Day; 07:00 AM - 03:00 PM, 03:00 PM - 11:00 PM, 11:00 PM - 7:00 am     pantoprazole (PROTONIX) 40 MG tablet Take 40 mg by mouth daily.     Polyethyl Glycol-Propyl Glycol (SYSTANE) 0.4-0.3 % SOLN Place 2 drops into both eyes 4 (four) times daily as needed (irritation).     Tiotropium Bromide-Olodaterol (STIOLTO RESPIMAT) 2.5-2.5 MCG/ACT AERS Inhale 2 puffs into the lungs daily.     zinc oxide 20 % ointment Apply 1 application. topically 2 (two) times daily as needed for irritation (apply to perirectal area prn - use with nystatin).     Current Facility-Administered Medications on File Prior to Visit  Medication Dose Route Frequency Provider Last Rate Last Admin   triamcinolone acetonide  (KENALOG) 10 MG/ML injection 10 mg  10 mg Other Once Wallene Huh, DPM        Allergies:   Allergies  Allergen Reactions   Amoxicillin Other (See Comments)    UTI Has patient had a PCN reaction causing immediate rash, facial/tongue/throat swelling, SOB or lightheadedness with hypotension: No Has patient had a PCN reaction causing severe rash involving mucus membranes or skin necrosis: No Has patient had a PCN reaction that required hospitalization: No Has patient had a PCN reaction occurring within the last 10 years: Yes--UTI ONLY If all of the above answers are "NO", then may proceed with Cephalosporin use.    Atenolol Cough   Crestor [Rosuvastatin Calcium] Other (See Comments)    Muscle aches   Pravastatin Other (See Comments)    Muscle aches   Sulfa Antibiotics Nausea Only   Codeine Other (See Comments)    hallucinations      OBJECTIVE:  Physical Exam  Vitals:   11/21/21 1351  BP: 126/72  Pulse: (!) 53  Weight: 100 lb (45.4 kg)  Height: 5\' 3"  (1.6 m)    Body mass index is 17.71 kg/m. No results found.  General: Frail very pleasant elderly Caucasian female with O2 via [place], seated, in no evident distress Head: head normocephalic and atraumatic.   Neck: supple with no carotid or supraclavicular bruits Cardiovascular: regular rate and rhythm, no murmurs Musculoskeletal: no deformity Skin:  no rash/petichiae Vascular:  Normal pulses all extremities   Neurologic Exam Mental Status: Awake and fully alert.  Moderate aphasia and dysarthria with speech hesitancy although improved since prior visit. No evidence of receptive aphasia.  Oriented to place  and time. Recent and remote memory intact. Attention span, concentration and fund of knowledge intact during visit. Mood and affect appropriate.  Cranial Nerves: Pupils equal, briskly reactive to light. Extraocular movements full without nystagmus. Visual fields full to confrontation. Hearing intact. Facial sensation  intact.  Left facial weakness (chronic 2/2 Bells Palsy). tongue, and palate moves normally and symmetrically.  Motor: Normal strength on left side. Dense right hemiplegia on exam today Sensory.: intact to touch , pinprick , position and vibratory sensation.  Coordination: Rapid alternating movements normal on left side. Finger-to-nose and heel-to-shin performed accurately on left side. Gait and Station: Deferred Reflexes: 2+ right UE and LE, 1+ left side. Toes downgoing.         ASSESSMENT: AARADHYA KYSAR is a 76 y.o. year old female with a left MCA stroke s/p IR with TICI 3 reperfusion in setting of PAF taken off Eliquis x1 week due to abdominal hematoma on 11/13/2020 and likely recrudescence of prior left MCA and ACA stroke with additional left hemispheric infarcts on 01/22/2021. Vascular risk factors include HTN, HLD, PAF, chronic right ICA occlusion, left ICA stenosis, intracranial stenosis, hypertrophic cardiomyopathy, mild aortic sclerosis, PVD and hx of left-sided Bell's palsy.      PLAN:  Left MCA strokes:  Residual deficit: Moderate aphasia and dysarthria, and dense right hemiplegia.  Continue working with SNF PT/OT/SLP.  Resolution of dysphagia - plans on PEG tube removal next week.  Continue Eliquis (apixaban) daily  and Zetia for secondary stroke prevention managed by SNF Discussed secondary stroke prevention measures and importance of close PCP/SNF follow up for aggressive stroke risk factor management including BP goal<130/90, and HLD with LDL goal<70 - request repeat lipid panel at next lab draw at SNF I have gone over the pathophysiology of stroke, warning signs and symptoms, risk factors and their management in some detail with instructions to go to the closest emergency room for symptoms of concern.  PAF: On Eliquis 5 mg twice daily for CHA2DS2-VASc score of at least 7.  Followed by cardiology  Carotid stenosis:  Repeat carotid ultrasound - based on results, will refer  to Dr. Edilia Bo [facility] (per patients request) for further management CTA head/neck 01/2021 chronic right ICA occlusion, left ICA 70% stenosis    As she is closely followed by PCP/SNF and cardiology, can follow up on an as-needed basis which patient and husband agreed with.  Advised to call with any stroke related questions or concerns in the future if needed   CC:  PCP: [name], MD    I spent 32 minutes of face-to-face and non-face-to-face time with patient and husband.  This included previsit chart review, lab review, study review, order entry, electronic health record documentation, patient and husband education regarding history of prior stroke with residual deficits, secondary stroke prevention measures and importance of managing stroke risk factors, and answered all other questions to patient and husband's satisfaction  Ihor Austin, AGNP-BC  [facility] [address] [place], [place] [address]  Phone [phone] Fax [phone] Note: This document was prepared with digital dictation and possible smart phrase technology. Any transcriptional errors that result from this process are unintentional.

## 2021-11-22 DIAGNOSIS — E785 Hyperlipidemia, unspecified: Secondary | ICD-10-CM | POA: Diagnosis not present

## 2021-11-22 LAB — LIPID PANEL
Cholesterol: 182 (ref 0–200)
Cholesterol: 182 (ref 0–200)
HDL: 80 — AB (ref 35–70)
HDL: 80 — AB (ref 35–70)
LDL Cholesterol: 89
LDL Cholesterol: 89
LDl/HDL Ratio: 2.3
Triglycerides: 64 (ref 40–160)
Triglycerides: 64 (ref 40–160)

## 2021-11-23 DIAGNOSIS — R2689 Other abnormalities of gait and mobility: Secondary | ICD-10-CM | POA: Diagnosis not present

## 2021-11-23 DIAGNOSIS — I5032 Chronic diastolic (congestive) heart failure: Secondary | ICD-10-CM | POA: Diagnosis not present

## 2021-11-23 DIAGNOSIS — R001 Bradycardia, unspecified: Secondary | ICD-10-CM | POA: Diagnosis not present

## 2021-11-23 DIAGNOSIS — I69351 Hemiplegia and hemiparesis following cerebral infarction affecting right dominant side: Secondary | ICD-10-CM | POA: Diagnosis not present

## 2021-11-23 DIAGNOSIS — R293 Abnormal posture: Secondary | ICD-10-CM | POA: Diagnosis not present

## 2021-11-23 DIAGNOSIS — M6389 Disorders of muscle in diseases classified elsewhere, multiple sites: Secondary | ICD-10-CM | POA: Diagnosis not present

## 2021-11-23 DIAGNOSIS — I63312 Cerebral infarction due to thrombosis of left middle cerebral artery: Secondary | ICD-10-CM | POA: Diagnosis not present

## 2021-11-23 DIAGNOSIS — J9601 Acute respiratory failure with hypoxia: Secondary | ICD-10-CM | POA: Diagnosis not present

## 2021-11-23 DIAGNOSIS — I6602 Occlusion and stenosis of left middle cerebral artery: Secondary | ICD-10-CM | POA: Diagnosis not present

## 2021-11-23 DIAGNOSIS — R471 Dysarthria and anarthria: Secondary | ICD-10-CM | POA: Diagnosis not present

## 2021-11-23 DIAGNOSIS — R482 Apraxia: Secondary | ICD-10-CM | POA: Diagnosis not present

## 2021-11-23 DIAGNOSIS — I480 Paroxysmal atrial fibrillation: Secondary | ICD-10-CM | POA: Diagnosis not present

## 2021-11-23 DIAGNOSIS — R278 Other lack of coordination: Secondary | ICD-10-CM | POA: Diagnosis not present

## 2021-11-27 ENCOUNTER — Non-Acute Institutional Stay (SKILLED_NURSING_FACILITY): Payer: Medicare Other | Admitting: Orthopedic Surgery

## 2021-11-27 ENCOUNTER — Encounter: Payer: Self-pay | Admitting: Orthopedic Surgery

## 2021-11-27 DIAGNOSIS — R748 Abnormal levels of other serum enzymes: Secondary | ICD-10-CM | POA: Diagnosis not present

## 2021-11-27 DIAGNOSIS — R1084 Generalized abdominal pain: Secondary | ICD-10-CM | POA: Diagnosis not present

## 2021-11-27 DIAGNOSIS — K219 Gastro-esophageal reflux disease without esophagitis: Secondary | ICD-10-CM

## 2021-11-27 DIAGNOSIS — K5901 Slow transit constipation: Secondary | ICD-10-CM

## 2021-11-27 NOTE — Progress Notes (Unsigned)
Location:  [facility] Room Number: 116/A Place of Service:  SNF ([id number]) Provider:Zahir Eisenhour NP    Virgie Dad, MD  Patient Care Team: Virgie Dad, MD as PCP - General (Internal Medicine) Jerline Pain, MD as PCP - Cardiology (Cardiology) Deboraha Sprang, MD as PCP - Electrophysiology (Cardiology) Jovita Gamma, MD as Consulting Physician (Neurosurgery)  Extended Emergency Contact Information Primary Emergency Contact: Schriver,[name] Address: [place] Phone: [phone] Mobile Phone: [phone] Relation: Spouse Secondary Emergency Contact: Rizo,[name] Address: [address]          [place], [place] [address] [place] of [place] Mobile Phone: [phone] Relation: Son  Code Status: Full Code  Goals of care: Advanced Directive information    11/27/2021    4:47 PM  Advanced Directives  Does Patient Have a Medical Advance Directive? No  Does patient want to make changes to medical advance directive? No - Patient declined     Chief Complaint  Patient presents with   Acute Visit    Abdominal pain    HPI:  Pt is a 76 y.o. female seen today for an acute visit for abdominal pain.   She reports increased abdominal pain x 2 days. Pain over LLQ encounter, later reported to have RUQ pain by nursing. Denies N/V, diarrhea or constipation. LBM 08/13, described as large. H/o severe indigestion resolved with Protonix. She was taking Protonix 40 mg BID, reduced to once daily a few months ago. Cbc/diff pending. 11/27/2021 total bilirubin 0.3, ALT/AST 44/34, alk phos 157, BUN/creat 21.6/0.73. Afebrile. Vitals stable. She has refused her medications a few times within the past day. She is eating bland diet. G tube scheduled to be removed 08/17 due to non use.    Past Medical History:  Diagnosis Date   Anxiety    Arthritis    "some in my lower back; probably  elbows, knees" (11/18/2017)   Atrial fibrillation (HCC)    Bell's palsy    when pt. was 76 yrs old, when under stress the left side of face will droop.   Complication of anesthesia    "vascular OR 2016; BP bottomed out; couldn't get it regulated; ended up in ICU for DAYS" (11/18/2017)   GERD (gastroesophageal reflux disease)    History of kidney stones    Hypertension    Hypertrophic cardiomyopathy (HCC)    severe LV basilar hypertrophy witn no evidence of significant outflow tract obstruction, EF 65-70%, mild LAE, mild TR, grade 1a diastolic dysfunction 6/94/85 (Dr. Candee Furbish) (Atrial Septal Hypertrophy pattern)-- Intra-op TEE with dsignificant outflow tract obstruction - AI, MR & TR   Insomnia    Mild aortic sclerosis    Osteopenia    Peripheral vascular disease (HCC)    Syncope    , Vagal   Past Surgical History:  Procedure Laterality Date   AUGMENTATION MAMMAPLASTY Bilateral    BACK SURGERY     CARDIAC CATHETERIZATION N/A 05/07/2016   Procedure: Left Heart Cath and Coronary Angiography;  Surgeon: Burnell Blanks, MD;  Location: [place] [facility] LAB;  Service: Cardiovascular;  Laterality: N/A;   CARDIOVERSION N/A 09/24/2017   Procedure: CARDIOVERSION;  Surgeon: Larey Dresser, MD;  Location: [facility] ENDOSCOPY;  Service: Cardiovascular;  Laterality: N/A;   DILATION AND CURETTAGE OF UTERUS     ENDARTERECTOMY FEMORAL Right 03/02/2015   Procedure: ENDARTERECTOMY RIGHT FEMORAL;  Surgeon:  Angelia Mould, MD;  Location: [facility];  Service: Vascular;  Laterality: Right;   ESOPHAGOGASTRODUODENOSCOPY (EGD) WITH PROPOFOL N/A 12/01/2020   Procedure: ESOPHAGOGASTRODUODENOSCOPY (EGD) WITH PROPOFOL;  Surgeon: Jesusita Oka, MD;  Location: [facility] ENDOSCOPY;  Service: General;  Laterality: N/A;   FACIAL COSMETIC SURGERY Left 2002   "related to [place] @ age 20; left eye/side of face droopy; tried to make area symmetrical"   FEMORAL-POPLITEAL BYPASS GRAFT Right 03/02/2015   Procedure: BYPASS  GRAFT FEMORAL-BELOW KNEE POPLITEAL ARTERY;  Surgeon: Angelia Mould, MD;  Location: [place];  Service: Vascular;  Laterality: Right;   INGUINAL HERNIA REPAIR Bilateral 2002   IR ANGIO INTRA EXTRACRAN SEL COM CAROTID INNOMINATE UNI L MOD SED  11/16/2020   IR CT HEAD LTD  11/13/2020   IR PERCUTANEOUS ART THROMBECTOMY/INFUSION INTRACRANIAL INC DIAG ANGIO  11/13/2020   OVARIAN CYST REMOVAL Left    PEG PLACEMENT N/A 12/01/2020   Procedure: PERCUTANEOUS ENDOSCOPIC GASTROSTOMY (PEG) PLACEMENT;  Surgeon: Jesusita Oka, MD;  Location: [facility] ENDOSCOPY;  Service: General;  Laterality: N/A;   PERIPHERAL VASCULAR CATHETERIZATION N/A 01/16/2015   Procedure: Abdominal Aortogram;  Surgeon: Angelia Mould, MD;  Location: [facility] CV LAB;  Service: Cardiovascular;  Laterality: N/A;   POSTERIOR LUMBAR FUSION  2015   "have plates and screws in there"   [place] N/A 11/13/2020   Procedure: IR WITH ANESTHESIA;  Surgeon: Radiologist, Medication, MD;  Location: [facility];  Service: Radiology;  Laterality: N/A;   TONSILLECTOMY      Allergies  Allergen Reactions   Amoxicillin Other (See Comments)    UTI Has patient had a PCN reaction causing immediate rash, facial/tongue/throat swelling, SOB or lightheadedness with hypotension: No Has patient had a PCN reaction causing severe rash involving mucus membranes or skin necrosis: No Has patient had a PCN reaction that required hospitalization: No Has patient had a PCN reaction occurring within the last 10 years: Yes--UTI ONLY If all of the above answers are "NO", then may proceed with Cephalosporin use.    Atenolol Cough   Crestor [Rosuvastatin Calcium] Other (See Comments)    Muscle aches   Pravastatin Other (See Comments)    Muscle aches   Sulfa Antibiotics Nausea Only   Codeine Other (See Comments)    hallucinations    Allergies as of 11/27/2021       Reactions   Amoxicillin Other (See Comments)   UTI Has patient had a PCN reaction  causing immediate rash, facial/tongue/throat swelling, SOB or lightheadedness with hypotension: No Has patient had a PCN reaction causing severe rash involving mucus membranes or skin necrosis: No Has patient had a PCN reaction that required hospitalization: No Has patient had a PCN reaction occurring within the last 10 years: Yes--UTI ONLY If all of the above answers are "NO", then may proceed with Cephalosporin use.   Atenolol Cough   Crestor [rosuvastatin Calcium] Other (See Comments)   Muscle aches   Pravastatin Other (See Comments)   Muscle aches   Sulfa Antibiotics Nausea Only   Codeine Other (See Comments)   hallucinations        Medication List        Accurate as of November 27, 2021  4:49 PM. If you have any questions, ask your nurse or doctor.          aluminum-magnesium hydroxide-simethicone 798-921-19 MG/5ML Susp Commonly known as: MAALOX Take 30 mLs by mouth every 4 (four) hours as needed (heartburn).   amiodarone 200  MG tablet Commonly known as: PACERONE Take 200 mg by mouth daily. May give crushed   amLODipine 5 MG tablet Commonly known as: NORVASC Take 5 mg by mouth daily.   apixaban 5 MG Tabs tablet Commonly known as: ELIQUIS Take 5 mg by mouth 2 (two) times daily. May give crushed   bisacodyl 5 MG EC tablet Commonly known as: DULCOLAX Take 5 mg by mouth 3 (three) times daily as needed for moderate constipation.   buPROPion ER 100 MG 12 hr tablet Commonly known as: WELLBUTRIN SR Take 100 mg by mouth 2 (two) times daily.   ezetimibe 10 MG tablet Commonly known as: ZETIA Take 10 mg by mouth daily. Oral if crushed   furosemide 20 MG tablet Commonly known as: LASIX Take 20 mg by mouth daily as needed. Give Lasix 35m for weight gain equal or greater than 3 pounds   hydrALAZINE 10 MG tablet Commonly known as: APRESOLINE Take 10 mg by mouth 3 (three) times daily as needed (SBP > 165 or DBP > 95).   ipratropium-albuterol 0.5-2.5 (3) MG/3ML  Soln Commonly known as: DUONEB Inhale 3 mLs into the lungs every 6 (six) hours as needed.   ketoconazole 2 % cream Commonly known as: NIZORAL Apply 1 application  topically daily as needed for irritation.   nystatin cream Commonly known as: MYCOSTATIN Apply 1 application topically 2 (two) times daily as needed for dry skin (apply to perirectal rash).   ondansetron 4 MG tablet Commonly known as: ZOFRAN Take 4 mg by mouth every 6 (six) hours as needed for nausea or vomiting.   OXYGEN Inhale 3 L into the lungs as directed. to maintain sats > or equal to 90%. May titrate Three Times A Day; 07:00 AM - 03:00 PM, 03:00 PM - 11:00 PM, 11:00 PM - 7:00 am   pantoprazole 40 MG tablet Commonly known as: PROTONIX Take 40 mg by mouth daily.   Stiolto Respimat 2.5-2.5 MCG/ACT Aers Generic drug: Tiotropium Bromide-Olodaterol Inhale 2 puffs into the lungs daily.   Systane 0.4-0.3 % Soln Generic drug: Polyethyl Glycol-Propyl Glycol Place 2 drops into both eyes 4 (four) times daily as needed (irritation).   Voltaren 1 % Gel Generic drug: diclofenac Sodium Apply topically as needed (Apply to right hip).   zinc oxide 20 % ointment Apply 1 application. topically 2 (two) times daily as needed for irritation (apply to perirectal area prn - use with nystatin).        Review of Systems  Constitutional:  Negative for activity change, appetite change, chills, fatigue and fever.  HENT:  Negative for trouble swallowing.   Eyes:  Negative for visual disturbance.  Respiratory:  Negative for cough, shortness of breath and wheezing.   Cardiovascular:  Negative for chest pain and leg swelling.  Gastrointestinal:  Positive for abdominal pain. Negative for abdominal distention, anal bleeding, blood in stool, constipation, diarrhea, nausea, rectal pain and vomiting.  Genitourinary:  Negative for dysuria, frequency and hematuria.  Musculoskeletal:  Positive for arthralgias and joint swelling.   Neurological:  Positive for weakness. Negative for dizziness and headaches.  Psychiatric/Behavioral:  Negative for confusion and dysphoric mood. The patient is not nervous/anxious.     Immunization History  Administered Date(s) Administered   Influenza Split 01/16/2009, 04/30/2010, 01/26/2016, 02/07/2018, 02/01/2020   Influenza, High Dose Seasonal PF 02/07/2018, 01/25/2019, 02/28/2021   Influenza,inj,Quad PF,6+ Mos 03/06/2012, 01/26/2016, 01/10/2017   Influenza,inj,quad, With Preservative 02/14/2015   Moderna Covid-19 Vaccine Bivalent Booster 121yr& up 02/28/2021  PFIZER(Purple Top)SARS-COV-2 Vaccination 05/06/2019, 05/27/2019, 01/27/2020, 09/18/2020   Pneumococcal Conjugate-13 03/19/2017   Pneumococcal Polysaccharide-23 02/14/2015   Td 02/25/2005   Tdap 05/19/2013   Zoster, Live 07/28/2006   Pertinent  Health Maintenance Due  Topic Date Due   INFLUENZA VACCINE  11/13/2021   DEXA SCAN  Discontinued      07/07/2021    7:30 AM 07/07/2021    7:20 PM 07/08/2021    9:00 AM 07/08/2021    9:30 PM 07/09/2021    8:00 AM  Fall Risk  Patient Fall Risk Level High fall risk High fall risk High fall risk High fall risk Moderate fall risk   Functional Status Survey:    Vitals:   11/27/21 1632  BP: 127/68  Pulse: (!) 58  Resp: 15  Temp: (!) 97 F (36.1 C)  SpO2: 95%  Weight: 96 lb 6.4 oz (43.7 kg)  Height: 5' 3"  (1.6 m)   Body mass index is 17.08 kg/m. Physical Exam Vitals reviewed.  Constitutional:      General: She is not in acute distress. HENT:     Head: Normocephalic.  Eyes:     General:        Right eye: No discharge.        Left eye: No discharge.  Cardiovascular:     Rate and Rhythm: Normal rate. Rhythm irregular.     Pulses: Normal pulses.     Heart sounds: Normal heart sounds.  Pulmonary:     Effort: Pulmonary effort is normal. No respiratory distress.     Breath sounds: Normal breath sounds. No wheezing.  Abdominal:     General: Bowel sounds are normal.  There is no distension.     Palpations: Abdomen is soft. There is no mass.     Tenderness: There is abdominal tenderness. There is no right CVA tenderness, left CVA tenderness, guarding or rebound.     Hernia: No hernia is present.     Comments: Gtube CDI, tenderness over LLQ  Musculoskeletal:     Cervical back: Neck supple.     Right lower leg: No edema.     Left lower leg: No edema.  Skin:    General: Skin is warm and dry.  Neurological:     General: No focal deficit present.     Mental Status: She is alert. Mental status is at baseline.     Motor: Weakness present.     Gait: Gait abnormal.     Comments: Right sided weakness  Psychiatric:        Mood and Affect: Mood normal.        Behavior: Behavior normal.     Labs reviewed: Recent Labs    11/29/20 0128 11/30/20 0826 12/02/20 0216 12/04/20 0052 01/23/21 0259 01/30/21 0000 07/06/21 1605 07/06/21 1612 07/07/21 0800 07/08/21 0717 07/09/21 0317 07/11/21 0000 09/18/21 0000  NA 151*   < > 140   < > 135   < > 138   < > 140 141 141 141 135*  K 4.4   < > 4.2   < > 3.6   < > 4.4   < > 4.3 4.0 3.8 4.1 5.1  CL 113*   < > 108   < > 94*   < > 104   < > 107 105 109 103 101  CO2 30   < > 25   < > 28   < > 30  --  27 28 29  26* 27*  GLUCOSE 140*   < >  181*   < > 85   < > 92   < > 80 87 90  --   --   BUN 60*   < > 51*   < > 14   < > 30*   < > 26* 22 21 24* 32*  CREATININE 0.75   < > 0.79   < > 0.85   < > 0.98   < > 0.79 0.71 0.73 0.7 0.9  CALCIUM 8.9   < > 8.4*   < > 8.7*   < > 8.8*  --  8.7* 9.0 8.7* 9.3 8.8  MG 2.3  --  2.2  --  2.0  --  2.1  --   --  1.9 1.9  --   --   PHOS 3.4  --  3.0  --  4.1  --   --   --   --   --   --   --   --    < > = values in this interval not displayed.   Recent Labs    01/22/21 0651 01/23/21 0259 03/26/21 0000 07/06/21 1605  AST 39 28 23 33  ALT 38 34 31 45*  ALKPHOS 68 57 67 76  BILITOT 1.3* 1.5*  --  0.7  PROT 6.1* 5.6*  --  6.2*  ALBUMIN 3.4* 3.1* 3.3* 3.2*   Recent Labs     01/22/21 0651 01/22/21 0658 02/02/21 0519 03/26/21 0000 07/06/21 1605 07/06/21 1612 07/08/21 0717 07/09/21 0317 07/11/21 0000  WBC 10.7*   < > 9.3   < > 9.3  --  9.8 9.5 8.3  NEUTROABS 7.7  --  7.2  --  6.5  --   --   --   --   HGB 15.5*   < > 14.4   < > 14.6   < > 14.9 14.3 14.1  HCT 48.4*   < > 45.3   < > 45.2   < > 45.4 43.7 42  MCV 96.4   < > 95.4  --  93.0  --  91.2 90.7  --   PLT 237   < > 286   < > 241  --  235 237 224   < > = values in this interval not displayed.   Lab Results  Component Value Date   TSH 0.632 07/06/2021   Lab Results  Component Value Date   HGBA1C 6.0 (H) 11/13/2020   Lab Results  Component Value Date   CHOL 182 11/22/2021   HDL 80 (A) 11/22/2021   LDLCALC 89 11/22/2021   TRIG 64 11/22/2021   CHOLHDL 3.0 11/13/2020    Significant Diagnostic Results in last 30 days:  No results found.  Assessment/Plan 1. Generalized abdominal pain - increased abdominal pain x 2 days  - LLQ tenderness on exam- later reported RUQ pain - LBM 08/13, described as large - H/o severe indigestion - stat cbc/diff- pending - stat cmp- total bilirubin 0.3, ALT/AST 44/34, alk phos 157 - KUB- pending - add stat ultrasound RUQ  2. Elevated liver enzymes - see above - discontinue tylenol  - may consider holding Zetia- will wait for U/S  3. Elevated alkaline phosphatase level - see above  4. Gastroesophageal reflux disease without esophagitis - see above - will increase Protonix to 40 mg po BID    Family/ staff Communication: plan discussed with patient and nurse  Labs/tests ordered: stat cbc/diff, cmp, KUB, ultrasound RUQ

## 2021-11-28 ENCOUNTER — Ambulatory Visit: Payer: Medicare Other | Admitting: Nurse Practitioner

## 2021-11-28 DIAGNOSIS — M62561 Muscle wasting and atrophy, not elsewhere classified, right lower leg: Secondary | ICD-10-CM | POA: Diagnosis not present

## 2021-11-28 DIAGNOSIS — R2689 Other abnormalities of gait and mobility: Secondary | ICD-10-CM | POA: Diagnosis not present

## 2021-11-28 DIAGNOSIS — I5032 Chronic diastolic (congestive) heart failure: Secondary | ICD-10-CM | POA: Diagnosis not present

## 2021-11-28 DIAGNOSIS — R278 Other lack of coordination: Secondary | ICD-10-CM | POA: Diagnosis not present

## 2021-11-28 DIAGNOSIS — Z79899 Other long term (current) drug therapy: Secondary | ICD-10-CM | POA: Diagnosis not present

## 2021-11-28 DIAGNOSIS — I63312 Cerebral infarction due to thrombosis of left middle cerebral artery: Secondary | ICD-10-CM | POA: Diagnosis not present

## 2021-11-28 DIAGNOSIS — J9601 Acute respiratory failure with hypoxia: Secondary | ICD-10-CM | POA: Diagnosis not present

## 2021-11-28 DIAGNOSIS — R109 Unspecified abdominal pain: Secondary | ICD-10-CM | POA: Diagnosis not present

## 2021-11-28 DIAGNOSIS — I69351 Hemiplegia and hemiparesis following cerebral infarction affecting right dominant side: Secondary | ICD-10-CM | POA: Diagnosis not present

## 2021-11-29 DIAGNOSIS — Z931 Gastrostomy status: Secondary | ICD-10-CM | POA: Diagnosis not present

## 2021-11-29 DIAGNOSIS — R109 Unspecified abdominal pain: Secondary | ICD-10-CM | POA: Diagnosis not present

## 2021-11-29 MED ORDER — POLYETHYLENE GLYCOL 3350 17 GM/SCOOP PO POWD: 17.0000 g | Freq: Two times a day (BID) | ORAL | 1 refills | Status: AC

## 2021-11-29 MED ORDER — POLYETHYLENE GLYCOL 3350 17 GM/SCOOP PO POWD: 17.0000 g | ORAL | 1 refills | Status: DC

## 2021-11-29 NOTE — Addendum Note (Signed)
Addended byHazle Nordmann E on: 11/29/2021 08:28 AM   Modules accepted: Orders

## 2021-11-30 DIAGNOSIS — I63312 Cerebral infarction due to thrombosis of left middle cerebral artery: Secondary | ICD-10-CM | POA: Diagnosis not present

## 2021-11-30 DIAGNOSIS — I5032 Chronic diastolic (congestive) heart failure: Secondary | ICD-10-CM | POA: Diagnosis not present

## 2021-11-30 DIAGNOSIS — I69351 Hemiplegia and hemiparesis following cerebral infarction affecting right dominant side: Secondary | ICD-10-CM | POA: Diagnosis not present

## 2021-11-30 DIAGNOSIS — R2689 Other abnormalities of gait and mobility: Secondary | ICD-10-CM | POA: Diagnosis not present

## 2021-11-30 DIAGNOSIS — J9601 Acute respiratory failure with hypoxia: Secondary | ICD-10-CM | POA: Diagnosis not present

## 2021-12-03 ENCOUNTER — Encounter: Payer: Self-pay | Admitting: Internal Medicine

## 2021-12-03 ENCOUNTER — Ambulatory Visit (HOSPITAL_COMMUNITY)
Admission: RE | Admit: 2021-12-03 | Discharge: 2021-12-03 | Disposition: A | Discharge status: Home / Self Care | Payer: Medicare Other | Source: Ambulatory Visit | Attending: Adult Health | Admitting: Adult Health

## 2021-12-03 DIAGNOSIS — I69351 Hemiplegia and hemiparesis following cerebral infarction affecting right dominant side: Secondary | ICD-10-CM | POA: Diagnosis not present

## 2021-12-03 DIAGNOSIS — I6521 Occlusion and stenosis of right carotid artery: Secondary | ICD-10-CM | POA: Diagnosis not present

## 2021-12-03 DIAGNOSIS — J9601 Acute respiratory failure with hypoxia: Secondary | ICD-10-CM | POA: Diagnosis not present

## 2021-12-03 DIAGNOSIS — R482 Apraxia: Secondary | ICD-10-CM | POA: Diagnosis not present

## 2021-12-03 DIAGNOSIS — R278 Other lack of coordination: Secondary | ICD-10-CM | POA: Diagnosis not present

## 2021-12-03 DIAGNOSIS — I5032 Chronic diastolic (congestive) heart failure: Secondary | ICD-10-CM | POA: Diagnosis not present

## 2021-12-03 DIAGNOSIS — I63312 Cerebral infarction due to thrombosis of left middle cerebral artery: Secondary | ICD-10-CM | POA: Diagnosis not present

## 2021-12-03 DIAGNOSIS — I6602 Occlusion and stenosis of left middle cerebral artery: Secondary | ICD-10-CM | POA: Diagnosis not present

## 2021-12-03 DIAGNOSIS — K769 Liver disease, unspecified: Secondary | ICD-10-CM | POA: Diagnosis not present

## 2021-12-03 DIAGNOSIS — R2689 Other abnormalities of gait and mobility: Secondary | ICD-10-CM | POA: Diagnosis not present

## 2021-12-03 DIAGNOSIS — R471 Dysarthria and anarthria: Secondary | ICD-10-CM | POA: Diagnosis not present

## 2021-12-03 DIAGNOSIS — I6522 Occlusion and stenosis of left carotid artery: Secondary | ICD-10-CM | POA: Diagnosis not present

## 2021-12-03 DIAGNOSIS — R293 Abnormal posture: Secondary | ICD-10-CM | POA: Diagnosis not present

## 2021-12-03 DIAGNOSIS — I480 Paroxysmal atrial fibrillation: Secondary | ICD-10-CM | POA: Diagnosis not present

## 2021-12-03 DIAGNOSIS — M6389 Disorders of muscle in diseases classified elsewhere, multiple sites: Secondary | ICD-10-CM | POA: Diagnosis not present

## 2021-12-03 DIAGNOSIS — M62561 Muscle wasting and atrophy, not elsewhere classified, right lower leg: Secondary | ICD-10-CM | POA: Diagnosis not present

## 2021-12-03 DIAGNOSIS — R001 Bradycardia, unspecified: Secondary | ICD-10-CM | POA: Diagnosis not present

## 2021-12-03 LAB — COMPREHENSIVE METABOLIC PANEL
Albumin: 3.4 — AB (ref 3.5–5.0)
Calcium: 9 (ref 8.7–10.7)
eGFR: 68

## 2021-12-03 LAB — BASIC METABOLIC PANEL
BUN: 28 — AB (ref 4–21)
CO2: 27 — AB (ref 13–22)
Chloride: 104 (ref 99–108)
Creatinine: 0.9 (ref 0.5–1.1)
Glucose: 76
Potassium: 4.4 mEq/L (ref 3.5–5.1)
Sodium: 139 (ref 137–147)

## 2021-12-03 LAB — HEPATIC FUNCTION PANEL
ALT: 48 U/L — AB (ref 7–35)
AST: 38 — AB (ref 13–35)
Alkaline Phosphatase: 122 (ref 25–125)
Bilirubin, Total: 0.3

## 2021-12-03 NOTE — Progress Notes (Signed)
Carotid artery duplex completed. Refer to "CV Proc" under chart review to view preliminary results.  12/03/2021 3:37 PM Eula Fried., MHA, RVT, RDCS, RDMS

## 2021-12-03 NOTE — Progress Notes (Unsigned)
Location:  [facility] Room Number: 116A Place of Service:  SNF [phone]) Provider:  Dr. Pollyann Savoy, MD  Patient Care Team: [name], MD as PCP - General (Internal Medicine) [name], MD as PCP - Cardiology (Cardiology) Duke Salvia, MD as PCP - Electrophysiology (Cardiology) Shirlean Kelly, MD as Consulting Physician (Neurosurgery)  Extended Emergency Contact Information Primary Emergency Contact: Busic,[name] Address: [address]          [place] [phone] [name] Home Phone: [phone] Mobile Phone: [phone] Relation: Spouse Secondary Emergency Contact: Gacek,[name] Address: [address]          [place], [place] [address] [name] Mobile Phone: [phone] Relation: Son  Code Status:  Full Code Goals of care: Advanced Directive information    11/27/2021    4:47 PM  Advanced Directives  Does Patient Have a Medical Advance Directive? No  Does patient want to make changes to medical advance directive? No - Patient declined     Chief Complaint  Patient presents with   Acute Visit    Complains of Abdomen Pain.     HPI:  Pt is a 76 y.o. female seen today for an acute visit for    Past Medical History:  Diagnosis Date   Anxiety    Arthritis    "some in my lower back; probably elbows, knees" (11/18/2017)   Atrial fibrillation (HCC)    Bell's palsy    when pt. was 76 yrs old, when under stress the left side of face will droop.   Complication of anesthesia    "vascular OR 2016; BP bottomed out; couldn't get it regulated; ended up in ICU for DAYS" (11/18/2017)   GERD (gastroesophageal reflux disease)    History of kidney stones    Hypertension    Hypertrophic cardiomyopathy (HCC)    severe LV basilar hypertrophy witn no evidence of significant outflow tract obstruction, EF 65-70%, mild LAE, mild TR, grade 1a diastolic dysfunction 05/15/10 (Dr.  Donato Schultz) (Atrial Septal Hypertrophy pattern)-- Intra-op TEE with dsignificant outflow tract obstruction - AI, MR & TR   Insomnia    Mild aortic sclerosis    Osteopenia    Peripheral vascular disease (HCC)    Syncope    , Vagal   Past Surgical History:  Procedure Laterality Date   AUGMENTATION MAMMAPLASTY Bilateral    BACK SURGERY     CARDIAC CATHETERIZATION N/A 05/07/2016   Procedure: Left Heart Cath and Coronary Angiography;  Surgeon: Kathleene Hazel, MD;  Location: [facility] INVASIVE CV LAB;  Service: Cardiovascular;  Laterality: N/A;   CARDIOVERSION N/A 09/24/2017   Procedure: CARDIOVERSION;  Surgeon: Laurey Morale, MD;  Location: [facility] ENDOSCOPY;  Service: Cardiovascular;  Laterality: N/A;   DILATION AND CURETTAGE OF UTERUS     ENDARTERECTOMY FEMORAL Right 03/02/2015   Procedure: ENDARTERECTOMY RIGHT FEMORAL;  Surgeon: Chuck Hint, MD;  Location: [facility] OR;  Service: Vascular;  Laterality: Right;   ESOPHAGOGASTRODUODENOSCOPY (EGD) WITH PROPOFOL N/A 12/01/2020   Procedure: ESOPHAGOGASTRODUODENOSCOPY (EGD) WITH PROPOFOL;  Surgeon: Diamantina Monks, MD;  Location: [facility] ENDOSCOPY;  Service: General;  Laterality: N/A;   FACIAL COSMETIC SURGERY Left 2002   "related to Bell's Palsy @ age 68; left eye/side of face droopy; tried to make area symmetrical"   FEMORAL-POPLITEAL BYPASS GRAFT Right 03/02/2015   Procedure: BYPASS GRAFT FEMORAL-BELOW KNEE POPLITEAL ARTERY;  Surgeon: Chuck Hint, MD;  Location: [facility] OR;  Service: Vascular;  Laterality: Right;   INGUINAL HERNIA REPAIR Bilateral 2002   IR ANGIO INTRA EXTRACRAN SEL COM CAROTID INNOMINATE UNI L MOD SED  11/16/2020   IR CT HEAD LTD  11/13/2020   IR PERCUTANEOUS ART THROMBECTOMY/INFUSION INTRACRANIAL INC DIAG ANGIO  11/13/2020   OVARIAN CYST REMOVAL Left    PEG PLACEMENT N/A 12/01/2020   Procedure: PERCUTANEOUS ENDOSCOPIC GASTROSTOMY (PEG) PLACEMENT;  Surgeon: Diamantina Monks, MD;  Location: [facility] ENDOSCOPY;  Service: General;   Laterality: N/A;   PERIPHERAL VASCULAR CATHETERIZATION N/A 01/16/2015   Procedure: Abdominal Aortogram;  Surgeon: Chuck Hint, MD;  Location: [facility] INVASIVE CV LAB;  Service: Cardiovascular;  Laterality: N/A;   POSTERIOR LUMBAR FUSION  2015   "have plates and screws in there"   RADIOLOGY WITH ANESTHESIA N/A 11/13/2020   Procedure: IR WITH ANESTHESIA;  Surgeon: Radiologist, Medication, MD;  Location: [facility] OR;  Service: Radiology;  Laterality: N/A;   TONSILLECTOMY      Allergies  Allergen Reactions   Amoxicillin Other (See Comments)    UTI Has patient had a PCN reaction causing immediate rash, facial/tongue/throat swelling, SOB or lightheadedness with hypotension: No Has patient had a PCN reaction causing severe rash involving mucus membranes or skin necrosis: No Has patient had a PCN reaction that required hospitalization: No Has patient had a PCN reaction occurring within the last 10 years: Yes--UTI ONLY If all of the above answers are "NO", then Raunak Antuna proceed with Cephalosporin use.    Atenolol Cough   Crestor [Rosuvastatin Calcium] Other (See Comments)    Muscle aches   Pravastatin Other (See Comments)    Muscle aches   Sulfa Antibiotics Nausea Only   Codeine Other (See Comments)    hallucinations    Outpatient Encounter Medications as of 12/03/2021  Medication Sig   acetaminophen (TYLENOL) 325 MG tablet Take 650 mg by mouth 2 (two) times daily as needed for mild pain.   aluminum-magnesium hydroxide-simethicone (MAALOX) 200-200-20 MG/5ML SUSP Take 30 mLs by mouth every 4 (four) hours as needed (heartburn).   amiodarone (PACERONE) 200 MG tablet Take 200 mg by mouth daily. Cherise Fedder give crushed   amLODipine (NORVASC) 5 MG tablet Take 5 mg by mouth daily.   apixaban (ELIQUIS) 5 MG TABS tablet Take 5 mg by mouth 2 (two) times daily. Manuel Dall give crushed   bisacodyl (DULCOLAX) 5 MG EC tablet Take 5 mg by mouth 3 (three) times daily as needed for moderate constipation.   buPROPion ER  (WELLBUTRIN SR) 100 MG 12 hr tablet Take 100 mg by mouth 2 (two) times daily.   diclofenac Sodium (VOLTAREN) 1 % GEL Apply one application to right hip and knee four times daily as needed.   ezetimibe (ZETIA) 10 MG tablet Take 10 mg by mouth daily. Oral if crushed   furosemide (LASIX) 20 MG tablet Take 20 mg by mouth daily as needed. Give Lasix 20mg  for weight gain equal or greater than 3 pounds   hydrALAZINE (APRESOLINE) 10 MG tablet Take 10 mg by mouth 3 (three) times daily as needed (SBP > 165 or DBP > 95).   ipratropium-albuterol (DUONEB) 0.5-2.5 (3) MG/3ML SOLN Inhale 3 mLs into the lungs every 6 (six) hours as needed.   ketoconazole (NIZORAL) 2 % cream Apply 1 application  topically daily as needed for irritation.   nystatin cream (MYCOSTATIN) Apply 1 application topically 2 (two) times daily as needed for dry skin (apply to perirectal rash).   ondansetron (ZOFRAN) 4 MG tablet Take 4 mg  by mouth every 6 (six) hours as needed for nausea or vomiting.   OXYGEN Inhale 3 L into the lungs as directed. to maintain sats > or equal to 90%. Taheera Thomann titrate Three Times A Day; 07:00 AM - 03:00 PM, 03:00 PM - 11:00 PM, 11:00 PM - 7:00 am   pantoprazole (PROTONIX) 40 MG tablet Take 40 mg by mouth 2 (two) times daily.   Polyethyl Glycol-Propyl Glycol (SYSTANE) 0.4-0.3 % SOLN Place 2 drops into both eyes 4 (four) times daily as needed (irritation).   polyethylene glycol powder (GLYCOLAX/MIRALAX) 17 GM/SCOOP powder Take 17 g by mouth every other day.   Tiotropium Bromide-Olodaterol (STIOLTO RESPIMAT) 2.5-2.5 MCG/ACT AERS Inhale 2 puffs into the lungs daily.   zinc oxide 20 % ointment Apply 1 application. topically 2 (two) times daily as needed for irritation (apply to perirectal area prn - use with nystatin).   Facility-Administered Encounter Medications as of 12/03/2021  Medication   triamcinolone acetonide (KENALOG) 10 MG/ML injection 10 mg    Review of Systems  Immunization History  Administered Date(s)  Administered   Influenza Split 01/16/2009, 04/30/2010, 01/26/2016, 02/07/2018, 02/01/2020   Influenza, High Dose Seasonal PF 02/07/2018, 01/25/2019, 02/28/2021   Influenza,inj,Quad PF,6+ Mos 03/06/2012, 01/26/2016, 01/10/2017   Influenza,inj,quad, With Preservative 02/14/2015   Moderna Covid-19 Vaccine Bivalent Booster 3yrs & up 02/28/2021   PFIZER(Purple Top)SARS-COV-2 Vaccination 05/06/2019, 05/27/2019, 01/27/2020, 09/18/2020   Pneumococcal Conjugate-13 03/19/2017   Pneumococcal Polysaccharide-23 02/14/2015   Td 02/25/2005   Tdap 05/19/2013   Zoster, Live 07/28/2006   Pertinent  Health Maintenance Due  Topic Date Due   INFLUENZA VACCINE  11/13/2021   DEXA SCAN  Discontinued      07/07/2021    7:30 AM 07/07/2021    7:20 PM 07/08/2021    9:00 AM 07/08/2021    9:30 PM 07/09/2021    8:00 AM  Fall Risk  Patient Fall Risk Level High fall risk High fall risk High fall risk High fall risk Moderate fall risk   Functional Status Survey:    Vitals:   12/03/21 1103  BP: 127/68  Pulse: (!) 58  Resp: 15  Temp: (!) 97 F (36.1 C)  TempSrc: Skin  SpO2: 90%  Weight: 96 lb 6.4 oz (43.7 kg)  Height: 5\' 3"  (1.6 m)   Body mass index is 17.08 kg/m. Physical Exam  Labs reviewed: Recent Labs    01/23/21 0259 01/30/21 0000 07/06/21 1605 07/06/21 1612 07/07/21 0800 07/08/21 0717 07/09/21 0317 07/11/21 0000 09/18/21 0000  NA 135   < > 138   < > 140 141 141 141 135*  K 3.6   < > 4.4   < > 4.3 4.0 3.8 4.1 5.1  CL 94*   < > 104   < > 107 105 109 103 101  CO2 28   < > 30  --  27 28 29  26* 27*  GLUCOSE 85   < > 92   < > 80 87 90  --   --   BUN 14   < > 30*   < > 26* 22 21 24* 32*  CREATININE 0.85   < > 0.98   < > 0.79 0.71 0.73 0.7 0.9  CALCIUM 8.7*   < > 8.8*  --  8.7* 9.0 8.7* 9.3 8.8  MG 2.0  --  2.1  --   --  1.9 1.9  --   --   PHOS 4.1  --   --   --   --   --   --   --   --    < > =  values in this interval not displayed.   Recent Labs    01/22/21 0651 01/23/21 0259  03/26/21 0000 07/06/21 1605  AST 39 28 23 33  ALT 38 34 31 45*  ALKPHOS 68 57 67 76  BILITOT 1.3* 1.5*  --  0.7  PROT 6.1* 5.6*  --  6.2*  ALBUMIN 3.4* 3.1* 3.3* 3.2*   Recent Labs    01/22/21 0651 01/22/21 0658 02/02/21 0519 03/26/21 0000 07/06/21 1605 07/06/21 1612 07/08/21 0717 07/09/21 0317 07/11/21 0000  WBC 10.7*   < > 9.3   < > 9.3  --  9.8 9.5 8.3  NEUTROABS 7.7  --  7.2  --  6.5  --   --   --   --   HGB 15.5*   < > 14.4   < > 14.6   < > 14.9 14.3 14.1  HCT 48.4*   < > 45.3   < > 45.2   < > 45.4 43.7 42  MCV 96.4   < > 95.4  --  93.0  --  91.2 90.7  --   PLT 237   < > 286   < > 241  --  235 237 224   < > = values in this interval not displayed.   Lab Results  Component Value Date   TSH 0.632 07/06/2021   Lab Results  Component Value Date   HGBA1C 6.0 (H) 11/13/2020   Lab Results  Component Value Date   CHOL 182 11/22/2021   HDL 80 (A) 11/22/2021   LDLCALC 89 11/22/2021   TRIG 64 11/22/2021   CHOLHDL 3.0 11/13/2020    Significant Diagnostic Results in last 30 days:  No results found.  Assessment/Plan There are no diagnoses linked to this encounter.   Family/ staff Communication: ***  Labs/tests ordered:  ***

## 2021-12-05 ENCOUNTER — Encounter: Payer: Self-pay | Admitting: Internal Medicine

## 2021-12-05 DIAGNOSIS — I5032 Chronic diastolic (congestive) heart failure: Secondary | ICD-10-CM | POA: Diagnosis not present

## 2021-12-05 DIAGNOSIS — I63312 Cerebral infarction due to thrombosis of left middle cerebral artery: Secondary | ICD-10-CM | POA: Diagnosis not present

## 2021-12-05 DIAGNOSIS — I6602 Occlusion and stenosis of left middle cerebral artery: Secondary | ICD-10-CM | POA: Diagnosis not present

## 2021-12-05 DIAGNOSIS — I69351 Hemiplegia and hemiparesis following cerebral infarction affecting right dominant side: Secondary | ICD-10-CM | POA: Diagnosis not present

## 2021-12-05 DIAGNOSIS — I480 Paroxysmal atrial fibrillation: Secondary | ICD-10-CM | POA: Diagnosis not present

## 2021-12-05 DIAGNOSIS — J9601 Acute respiratory failure with hypoxia: Secondary | ICD-10-CM | POA: Diagnosis not present

## 2021-12-05 DIAGNOSIS — R2689 Other abnormalities of gait and mobility: Secondary | ICD-10-CM | POA: Diagnosis not present

## 2021-12-05 DIAGNOSIS — R482 Apraxia: Secondary | ICD-10-CM | POA: Diagnosis not present

## 2021-12-05 DIAGNOSIS — R471 Dysarthria and anarthria: Secondary | ICD-10-CM | POA: Diagnosis not present

## 2021-12-06 DIAGNOSIS — R278 Other lack of coordination: Secondary | ICD-10-CM | POA: Diagnosis not present

## 2021-12-06 DIAGNOSIS — R001 Bradycardia, unspecified: Secondary | ICD-10-CM | POA: Diagnosis not present

## 2021-12-06 DIAGNOSIS — M6389 Disorders of muscle in diseases classified elsewhere, multiple sites: Secondary | ICD-10-CM | POA: Diagnosis not present

## 2021-12-06 DIAGNOSIS — I63312 Cerebral infarction due to thrombosis of left middle cerebral artery: Secondary | ICD-10-CM | POA: Diagnosis not present

## 2021-12-06 DIAGNOSIS — I69351 Hemiplegia and hemiparesis following cerebral infarction affecting right dominant side: Secondary | ICD-10-CM | POA: Diagnosis not present

## 2021-12-07 ENCOUNTER — Other Ambulatory Visit: Payer: Self-pay | Admitting: Nurse Practitioner

## 2021-12-07 ENCOUNTER — Ambulatory Visit (INDEPENDENT_AMBULATORY_CARE_PROVIDER_SITE_OTHER): Payer: Medicare Other

## 2021-12-07 ENCOUNTER — Other Ambulatory Visit: Payer: Self-pay | Admitting: General Practice

## 2021-12-07 ENCOUNTER — Encounter: Payer: Self-pay | Admitting: Nurse Practitioner

## 2021-12-07 ENCOUNTER — Ambulatory Visit: Payer: Medicare Other | Admitting: Nurse Practitioner

## 2021-12-07 DIAGNOSIS — I5032 Chronic diastolic (congestive) heart failure: Secondary | ICD-10-CM | POA: Diagnosis not present

## 2021-12-07 DIAGNOSIS — J9601 Acute respiratory failure with hypoxia: Secondary | ICD-10-CM | POA: Diagnosis not present

## 2021-12-07 DIAGNOSIS — I6602 Occlusion and stenosis of left middle cerebral artery: Secondary | ICD-10-CM | POA: Diagnosis not present

## 2021-12-07 DIAGNOSIS — R001 Bradycardia, unspecified: Secondary | ICD-10-CM | POA: Diagnosis not present

## 2021-12-07 DIAGNOSIS — M6389 Disorders of muscle in diseases classified elsewhere, multiple sites: Secondary | ICD-10-CM | POA: Diagnosis not present

## 2021-12-07 DIAGNOSIS — R471 Dysarthria and anarthria: Secondary | ICD-10-CM | POA: Diagnosis not present

## 2021-12-07 DIAGNOSIS — J189 Pneumonia, unspecified organism: Secondary | ICD-10-CM

## 2021-12-07 DIAGNOSIS — J9611 Chronic respiratory failure with hypoxia: Secondary | ICD-10-CM | POA: Diagnosis not present

## 2021-12-07 DIAGNOSIS — J188 Other pneumonia, unspecified organism: Secondary | ICD-10-CM

## 2021-12-07 DIAGNOSIS — I69351 Hemiplegia and hemiparesis following cerebral infarction affecting right dominant side: Secondary | ICD-10-CM | POA: Diagnosis not present

## 2021-12-07 DIAGNOSIS — I480 Paroxysmal atrial fibrillation: Secondary | ICD-10-CM | POA: Diagnosis not present

## 2021-12-07 DIAGNOSIS — J449 Chronic obstructive pulmonary disease, unspecified: Secondary | ICD-10-CM | POA: Diagnosis not present

## 2021-12-07 DIAGNOSIS — J439 Emphysema, unspecified: Secondary | ICD-10-CM

## 2021-12-07 DIAGNOSIS — I63312 Cerebral infarction due to thrombosis of left middle cerebral artery: Secondary | ICD-10-CM | POA: Diagnosis not present

## 2021-12-07 DIAGNOSIS — R278 Other lack of coordination: Secondary | ICD-10-CM | POA: Diagnosis not present

## 2021-12-07 DIAGNOSIS — R482 Apraxia: Secondary | ICD-10-CM | POA: Diagnosis not present

## 2021-12-07 DIAGNOSIS — R2689 Other abnormalities of gait and mobility: Secondary | ICD-10-CM | POA: Diagnosis not present

## 2021-12-07 NOTE — Progress Notes (Signed)
@Patient  ID: Beverly Li, female    DOB: 11-06-1945, 76 y.o.   MRN: [id number]  Chief Complaint  Patient presents with   Follow-up    F/U with CXR to see if improved. Breathing generally better    Referring provider: Virgie Dad, MD  HPI: 76 year old female, former smoker (50 pack years) followed for COPD and chronic respiratory failure.  She is a patient of Dr. Agustina Caroli and last seen in office on 10/08/2021 by [facility] NP.  Past medical history significant for hypertension, CHF, recent stroke, PAF on Eliquis and amiodarone, PAD, hypertrophic cardiomyopathy, GERD, dysphagia post stroke, HLD, severe protein calorie malnutrition, long QT interval.  TEST/EVENTS:  12/05/2020 CT chest w con: Atherosclerosis and CAD.  There is cardiomegaly and LVH.  Marked enlargement of the central pulmonary arteries.  No evidence of PE.  No LAD.  There is severe emphysema.  Small bilateral pleural effusions have developed with bibasilar compressive atelectasis.  No superimposed focal pulmonary infiltrate.  Central airways are widely patent. 12/21/2020 swallow study: Overall improved oropharyngeal function since 8/24.  She did still have trace aspiration with thin liquids followed by strong cough.  Recommended initiating a dysphagia 1 diet with nectar thick liquids.  She was allowed to have thin liquids outside of meals. 10/08/2021 CXR 2 view: There are ill-defined peripheral right midlung and right basilar opacities.  There is a small right pleural effusion.  10/08/2021: OV with Kirsten Spearing NP for acute visit. She has not been seen since January 2022.  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  She was also recently hospitalized for bradycardia.  She was discharged to skilled nursing at [facility], which she is normally a long-term care resident of.  Earlier this month, she reported having increased cough with shortness of breath and was evaluated by [facility] provider.  Chest  x-ray showed a right middle lobe pneumonia.  She was treated with prednisone taper and doxycycline course.  She then had another acute visit on 09/28/2021 with their onsite provider.  She was still having a persistent cough, feeling fatigued with poor appetite and also requiring 3 L of oxygen compared to her normal 2 lpm.  Repeat x-ray showed a persistent infiltrate in the right lobe without any new changes.  She was treated for possible AECOPD with prednisone taper and recommended to follow-up here.   Today, she reports having a persistent cough which is now productive with brown sputum at times.  She reports feeling like her breathing is relatively stable without any increase shortness of breath; however she does continue to require 3 L/min supplemental O2 to maintain her oxygen saturations.  She has also felt like she has had some fevers recently but there are none reported on her vitals from the facility.  Appetite is poor and has not returned to her baseline.  She also feels more fatigued than she normally does.  Her husband reports that she does not seem to be coughing or have any witnessed aspiration events while eating.  They deny any hemoptysis, night sweats, lower extremity edema, wheezing. She continues on Stiolto and is receiving duonebs per the [facility] from the facility. Unresolved multifocal pneumonia in the right middle lobe and right lower lobe with minimal clinical improvement since doxycycline course. I am concerned she could be aspirating so may consider repeat swallow study moving forward. She does work with speech therapy regularly at her facility and swallow from September showed mild aspiration risk. Limited by abx choices  today due to allergies, amiodarone use, and hx of prolonged QT interval - will treat with cefpodoxime x 7 days. Check sputum culture. Mucociliary clearance therapies recommended. Continue nebs as previously ordered. Strict ED precautions. Breathing stable and already treated  with 2 prednisone tapers so held further steroid use. Close follow up.  10/29/2021: OV with Wendelin Reader NP for follow-up after being treated for unresolved multifocal pneumonia.  She was treated with doxycycline course at her facility and then presented to [place] with recurrence of productive cough and unresolved right midlung and right basilar opacities as well as a small right parapneumonic effusion.  She was treated with cefpodoxime course and mucociliary clearance therapies.  Today, she reports feeling much better.  Her cough has resolved.  Breathing is overall at her baseline.  She is still on 3 L supplemental O2; however, they have not tried decreasing her back to her baseline of 2 L.  They deny any fevers, chills, night sweats, hemoptysis.  She continues on Stiolto daily and gets DuoNebs PRN.  12/07/2021: Today - follow up Patient presents today with her husband for follow up and repeat chest x ray to assess for resolution of pneumonia. She is doing much better since I saw her in June. She reports that her breathing is at her baseline. She does not have any chest congestion or cough. She is feeling well with no concerns or complaints. She continues on Stiolto daily. Hasn't had to use nebs or albuterol recently. She continues on 2 lpm pulsed supplemental O2, which is her baseline.   Allergies  Allergen Reactions   Amoxicillin Other (See Comments)    UTI Has patient had a PCN reaction causing immediate rash, facial/tongue/throat swelling, SOB or lightheadedness with hypotension: No Has patient had a PCN reaction causing severe rash involving mucus membranes or skin necrosis: No Has patient had a PCN reaction that required hospitalization: No Has patient had a PCN reaction occurring within the last 10 years: Yes--UTI ONLY If all of the above answers are "NO", then may proceed with Cephalosporin use.    Atenolol Cough   Crestor [Rosuvastatin Calcium] Other (See Comments)    Muscle aches   Pravastatin Other  (See Comments)    Muscle aches   Sulfa Antibiotics Nausea Only   Codeine Other (See Comments)    hallucinations    Immunization History  Administered Date(s) Administered   Influenza Split 01/16/2009, 04/30/2010, 01/26/2016, 02/07/2018, 02/01/2020   Influenza, High Dose Seasonal PF 02/07/2018, 01/25/2019, 02/28/2021   Influenza,inj,Quad PF,6+ Mos 03/06/2012, 01/26/2016, 01/10/2017   Influenza,inj,quad, With Preservative 02/14/2015   Moderna Covid-19 Vaccine Bivalent Booster 56yrs & up 02/28/2021   PFIZER(Purple Top)SARS-COV-2 Vaccination 05/06/2019, 05/27/2019, 01/27/2020, 09/18/2020   Pneumococcal Conjugate-13 03/19/2017   Pneumococcal Polysaccharide-23 02/14/2015   Td 02/25/2005   Tdap 05/19/2013   Zoster, Live 07/28/2006    Past Medical History:  Diagnosis Date   Anxiety    Arthritis    "some in my lower back; probably elbows, knees" (11/18/2017)   Atrial fibrillation (HCC)    Bell's palsy    when pt. was 76 yrs old, when under stress the left side of face will droop.   Complication of anesthesia    "vascular OR 2016; BP bottomed out; couldn't get it regulated; ended up in ICU for DAYS" (11/18/2017)   GERD (gastroesophageal reflux disease)    History of kidney stones    Hypertension    Hypertrophic cardiomyopathy (HCC)    severe LV basilar hypertrophy witn no evidence of significant  outflow tract obstruction, EF 65-70%, mild LAE, mild TR, grade 1a diastolic dysfunction [id number] (Dr. Candee Furbish) (Atrial Septal Hypertrophy pattern)-- Intra-op TEE with dsignificant outflow tract obstruction - AI, MR & TR   Insomnia    Mild aortic sclerosis    Osteopenia    Peripheral vascular disease (HCC)    Syncope    , Vagal    Tobacco History: Social History   Tobacco Use  Smoking Status Former   Packs/day: 1.00   Years: 50.00   Total pack years: 50.00   Types: Cigarettes   Quit date: 12/15/2014   Years since quitting: 6.9  Smokeless Tobacco Never   Counseling given: Not  Answered   Outpatient Medications Prior to Visit  Medication Sig Dispense Refill   acetaminophen (TYLENOL) 325 MG tablet Take 650 mg by mouth 2 (two) times daily as needed for mild pain.     aluminum-magnesium hydroxide-simethicone (MAALOX) [id number] MG/5ML SUSP Take 30 mLs by mouth every 4 (four) hours as needed (heartburn).     amiodarone (PACERONE) 200 MG tablet Take 200 mg by mouth daily. May give crushed     amLODipine (NORVASC) 5 MG tablet Take 5 mg by mouth daily.     apixaban (ELIQUIS) 5 MG TABS tablet Take 5 mg by mouth 2 (two) times daily. May give crushed     bisacodyl (DULCOLAX) 5 MG EC tablet Take 5 mg by mouth 3 (three) times daily as needed for moderate constipation.     buPROPion ER (WELLBUTRIN SR) 100 MG 12 hr tablet Take 100 mg by mouth 2 (two) times daily.     diclofenac Sodium (VOLTAREN) 1 % GEL Apply one application to right hip and knee four times daily as needed.     ezetimibe (ZETIA) 10 MG tablet Take 10 mg by mouth daily. Oral if crushed     furosemide (LASIX) 20 MG tablet Take 20 mg by mouth daily as needed. Give Lasix 20mg  for weight gain equal or greater than 3 pounds     hydrALAZINE (APRESOLINE) 10 MG tablet Take 10 mg by mouth 3 (three) times daily as needed (SBP > 165 or DBP > 95).     ipratropium-albuterol (DUONEB) 0.5-2.5 (3) MG/3ML SOLN Inhale 3 mLs into the lungs every 6 (six) hours as needed.     ketoconazole (NIZORAL) 2 % cream Apply 1 application  topically daily as needed for irritation.     nystatin cream (MYCOSTATIN) Apply 1 application topically 2 (two) times daily as needed for dry skin (apply to perirectal rash).     ondansetron (ZOFRAN) 4 MG tablet Take 4 mg by mouth every 6 (six) hours as needed for nausea or vomiting.     OXYGEN Inhale 3 L into the lungs as directed. to maintain sats > or equal to 90%. May titrate Three Times A Day; 07:00 AM - 03:00 PM, 03:00 PM - 11:00 PM, 11:00 PM - 7:00 am     pantoprazole (PROTONIX) 40 MG tablet Take 40 mg by  mouth 2 (two) times daily.     Polyethyl Glycol-Propyl Glycol (SYSTANE) 0.4-0.3 % SOLN Place 2 drops into both eyes 4 (four) times daily as needed (irritation).     polyethylene glycol powder (GLYCOLAX/MIRALAX) 17 GM/SCOOP powder Take 17 g by mouth every other day. 3350 g 1   Tiotropium Bromide-Olodaterol (STIOLTO RESPIMAT) 2.5-2.5 MCG/ACT AERS Inhale 2 puffs into the lungs daily.     zinc oxide 20 % ointment Apply 1 application. topically 2 (two) times daily as  needed for irritation (apply to perirectal area prn - use with nystatin).     Facility-Administered Medications Prior to Visit  Medication Dose Route Frequency Provider Last Rate Last Admin   triamcinolone acetonide (KENALOG) 10 MG/ML injection 10 mg  10 mg Other Once Wallene Huh, DPM         Review of Systems:   Constitutional: No further weight loss, night sweats, fevers, chills, fatigue, lassitude HEENT: No headaches, difficulty swallowing, tooth/dental problems, or sore throat. No sneezing, itching, ear ache, nasal congestion, or post nasal drip CV:  No chest pain, orthopnea, PND, swelling in lower extremities, anasarca, dizziness, palpitations, syncope Resp: +shortness of breath with exertion (baseline); resolved cough.  No hemoptysis. No wheezing.  No chest wall deformity GI:  No heartburn, indigestion, abdominal pain, nausea, vomiting, diarrhea, change in bowel habits, bloody stools.  GU: No dysuria, change in color of urine, urgency or frequency.  No flank pain, no hematuria  Skin: No rash, lesions, ulcerations MSK:  No joint pain or swelling.  No decreased range of motion.  No back pain. + Planter fasciitis Neuro: No dizziness or lightheadedness.  Psych: No depression or anxiety. Mood stable.     Physical Exam:  BP 118/70 (BP Location: Left Arm, Patient Position: Sitting)   Pulse 64   Temp 97.8 F (36.6 C) (Oral)   Ht 5\' 5"  (1.651 m)   Wt 95 lb 12.8 oz (43.5 kg)   LMP  (LMP Unknown)   SpO2 96% Comment: 2L   BMI 15.94 kg/m   GEN: Pleasant, interactive, chronically-ill appearing; in no acute distress. HEENT:  Normocephalic and atraumatic. PERRLA. Sclera white. Nasal turbinates pink, moist and patent bilaterally. No rhinorrhea present. Oropharynx pink and moist, without exudate or edema. No lesions, ulcerations, or postnasal drip.  NECK:  Supple w/ fair ROM. No JVD present. Normal carotid impulses w/o bruits. Thyroid symmetrical with no goiter or nodules palpated. No lymphadenopathy.   CV: RRR, no m/r/g, no peripheral edema. Pulses intact, +2 bilaterally. No cyanosis, pallor or clubbing. PULMONARY:  Unlabored, regular breathing. Diminished bases bilaterally otherwise clear A&P w/o wheezes/rales/rhonchi. No accessory muscle use. No dullness to percussion. GI: BS present and normoactive. Soft, non-tender to palpation. No organomegaly or masses detected. No CVA tenderness. MSK: No erythema, warmth or tenderness. Cap refil <2 sec all extrem. No deformities or joint swelling noted.  Neuro: A/Ox3. Right flaccid hemiparesis. Aphasia  Skin: Warm, no lesions or rashe Psych: Normal affect and behavior. Judgement and thought content appropriate.     Lab Results:  CBC    Component Value Date/Time   WBC 8.3 07/11/2021 0000   WBC 9.5 07/09/2021 0317   RBC 4.784 07/11/2021 0000   HGB 14.1 07/11/2021 0000   HGB 17.2 (H) 01/25/2020 0923   HCT 42 07/11/2021 0000   HCT 50.0 (H) 01/25/2020 0923   PLT 224 07/11/2021 0000   PLT 270 01/25/2020 0923   MCV 90.7 07/09/2021 0317   MCV 90 01/25/2020 0923   MCH 29.7 07/09/2021 0317   MCHC 32.7 07/09/2021 0317   RDW 16.7 (H) 07/09/2021 0317   RDW 13.2 01/25/2020 0923   LYMPHSABS 1.9 07/06/2021 1605   LYMPHSABS 3.3 (H) 09/23/2017 1629   MONOABS 0.7 07/06/2021 1605   EOSABS 0.1 07/06/2021 1605   EOSABS 0.0 09/23/2017 1629   BASOSABS 0.0 07/06/2021 1605   BASOSABS 0.0 09/23/2017 1629    BMET    Component Value Date/Time   NA 139 12/03/2021 0000   K 4.4  12/03/2021 0000   CL 104 12/03/2021 0000   CO2 27 (A) 12/03/2021 0000   GLUCOSE 90 07/09/2021 0317   BUN 28 (A) 12/03/2021 0000   CREATININE 0.9 12/03/2021 0000   CREATININE 0.73 07/09/2021 0317   CALCIUM 9.0 12/03/2021 0000   GFRNONAA >60 07/09/2021 0317   GFRAA 65 01/25/2020 0923    BNP    Component Value Date/Time   BNP 1,459.3 (H) 02/02/2021 0519     Imaging:  DG Chest 2 View  Result Date: 12/07/2021 CLINICAL DATA:  Follow-up multifocal pneumonia EXAM: CHEST - 2 VIEW COMPARISON:  10/08/2021 FINDINGS: Normal heart size and pulmonary vascularity. Tortuous thoracic aorta with atherosclerotic calcifications. Emphysematous and bronchitic changes consistent with COPD. Persistent opacity at lateral RIGHT lung base on AP view, not localized on lateral view, question asymmetric density related to RIGHT breast prosthesis with capsular calcification. Remaining lungs clear. No pleural effusion or pneumothorax. Osseous demineralization with calcified loose body inferior to the LEFT coracoid process. Height losses of multiple thoracic vertebra unchanged. IMPRESSION: COPD changes with persistent opacity at lateral RIGHT lung base, not localized on lateral view, suspect may represent asymmetric chest wall density related to RIGHT breast prosthesis with capsular calcification rather than persistent infiltrate. Recommend correlation with patient symptoms and consider additional follow-up imaging in 1-2 months for assess for persistence versus resolution. Electronically Signed   By: Lavonia Dana M.D.   On: 12/07/2021 15:26   VAS US CAROTID  Result Date: 12/04/2021 Carotid Arterial Duplex Study Patient Name:  Beverly Li  Date of Exam:   12/03/2021 Medical Rec #: [id number]        Accession #:    [id number] Date of Birth: 21-Dec-1945        Patient Gender: F Patient Age:   58 years Exam Location:  [facility] Procedure:      VAS US CAROTID Referring Phys: JESSICA MCCUE  --------------------------------------------------------------------------------  Indications:       Carotid artery disease. Risk Factors:      Hypertension, PAD. Comparison Study:  02/12/2018- carotid artery duplex 1-39% bilateral ICA                    stenosis Performing Technologist: Maudry Mayhew MHA, RDMS, RVT, RDCS  Examination Guidelines: A complete evaluation includes B-mode imaging, spectral Doppler, color Doppler, and power Doppler as needed of all accessible portions of each vessel. Bilateral testing is considered an integral part of a complete examination. Limited examinations for reoccurring indications may be performed as noted.  Right Carotid Findings: +----------+--------+--------+--------+------------------+---------------------+           PSV cm/sEDV cm/sStenosisPlaque DescriptionComments              +----------+--------+--------+--------+------------------+---------------------+ CCA Prox  30                                        intimal thickening    +----------+--------+--------+--------+------------------+---------------------+ CCA Distal18                                                              +----------+--------+--------+--------+------------------+---------------------+ ICA Prox  32  absent diastolic flow +----------+--------+--------+--------+------------------+---------------------+ ICA Distal9                                         thumped waveform      +----------+--------+--------+--------+------------------+---------------------+ ECA       139                                                             +----------+--------+--------+--------+------------------+---------------------+ +----------+--------+-------+----------------+-------------------+           PSV cm/sEDV cmsDescribe        Arm Pressure (mmHG) +----------+--------+-------+----------------+-------------------+  [id number]             Multiphasic, WNL                    +----------+--------+-------+----------------+-------------------+ +---------+--------+--+--------+-+---------+ VertebralPSV cm/s76EDV cm/s8Antegrade +---------+--------+--+--------+-+---------+  Left Carotid Findings: +----------+--------+--------+--------+--------------------------+--------+           PSV cm/sEDV cm/sStenosisPlaque Description        Comments +----------+--------+--------+--------+--------------------------+--------+ CCA Prox  53      8               heterogenous                       +----------+--------+--------+--------+--------------------------+--------+ CCA Distal37      11              smooth and heterogenous            +----------+--------+--------+--------+--------------------------+--------+ ICA Prox  47      16              heterogenous and irregular         +----------+--------+--------+--------+--------------------------+--------+ ICA Mid   80      25                                                 +----------+--------+--------+--------+--------------------------+--------+ ICA Distal60      19                                                 +----------+--------+--------+--------+--------------------------+--------+ ECA       36                      heterogenous                       +----------+--------+--------+--------+--------------------------+--------+ +----------+--------+--------+----------------+-------------------+           PSV cm/sEDV cm/sDescribe        Arm Pressure (mmHG) +----------+--------+--------+----------------+-------------------+ Subclavian190             Multiphasic, WNL                    +----------+--------+--------+----------------+-------------------+ +---------+--------+--+--------+--+---------+ VertebralPSV cm/s47EDV cm/s10Antegrade +---------+--------+--+--------+--+---------+   Summary: Right Carotid:  Significantly dampened ICA flow, suggestive of possible distal  occlusion. Left Carotid: Velocities in the left ICA are consistent with a 1-39% stenosis. Vertebrals:  Bilateral vertebral arteries demonstrate antegrade flow. Subclavians: Normal flow hemodynamics were seen in bilateral subclavian              arteries. *See table(s) above for measurements and observations.  Electronically signed by Gerarda Fraction on 12/04/2021 at 4:12:15 AM.    Final     triamcinolone acetonide (KENALOG) 10 MG/ML injection 10 mg     Date Action Dose Route User   11/05/2021 0858 Given 10 mg Other Lenn Sink, DPM          Latest Ref Rng & Units 03/15/2019    2:52 PM  PFT Results  FVC-Pre L 1.85   FVC-Predicted Pre % 66   FVC-Post L 1.91   FVC-Predicted Post % 69   Pre FEV1/FVC % % 45   Post FEV1/FCV % % 48   FEV1-Pre L 0.82   FEV1-Predicted Pre % 39   FEV1-Post L 0.91   DLCO uncorrected ml/min/mmHg 7.74   DLCO UNC% % 41   DLVA Predicted % 53   TLC L 5.13   TLC % Predicted % 104   RV % Predicted % 143     No results found for: "NITRICOXIDE"      Assessment & Plan:   Multifocal pneumonia Clinical and radiographic improvement. She has a persistent opacity at the right lung base, which appears to be related to asymmetric chest wall density related to capsulated right breast implant. She did have a mammogram recently and this breast implant is much more dense than the other. Given her resolution of infectious symptoms, do not think this is a persistent infiltrate. We will recheck imaging in 2 months to assess for stability.   Patient Instructions  Continue Stiolto 2 puffs daily Continue Albuterol inhaler 2 puffs or duoneb 3 mL neb every 6 hours as needed for shortness of breath or wheezing. Notify if symptoms persist despite rescue inhaler/neb use. Continue supplemental oxygen 2-3 lpm POC and continuous at night for goal oxygen >88-90% Continue Flutter valve 2-3 times a day as  needed for chest congestion/cough   Follow up in 2 months with Dr. Delton Coombes. If symptoms do not improve or worsen, please contact office for sooner follow up or seek emergency care.    COPD (chronic obstructive pulmonary disease) (HCC) Compensated on current regimen. She will continue on Stiolto and PRN albuterol/duonebs.   Chronic respiratory failure with hypoxia (HCC) Stable without increased O2 requirement. She was able to return to her baseline 2 lpm POC. Goal >88-90%     I spent 28 minutes of dedicated to the care of this patient on the date of this encounter to include pre-visit review of records, face-to-face time with the patient discussing conditions above, post visit ordering of testing, clinical documentation with the electronic health record, making appropriate referrals as documented, and communicating necessary findings to members of the patients care team.  Noemi Chapel, NP 12/07/2021  Pt aware and understands NP's role.

## 2021-12-07 NOTE — Assessment & Plan Note (Signed)
Compensated on current regimen. She will continue on Stiolto and PRN albuterol/duonebs.

## 2021-12-07 NOTE — Assessment & Plan Note (Signed)
Stable without increased O2 requirement. She was able to return to her baseline 2 lpm POC. Goal >88-90%

## 2021-12-07 NOTE — Patient Instructions (Addendum)
Continue Stiolto 2 puffs daily Continue Albuterol inhaler 2 puffs or duoneb 3 mL neb every 6 hours as needed for shortness of breath or wheezing. Notify if symptoms persist despite rescue inhaler/neb use. Continue supplemental oxygen 2-3 lpm POC and continuous at night for goal oxygen >88-90% Continue Flutter valve 2-3 times a day as needed for chest congestion/cough   Follow up in 2 months with Dr. Delton Coombes. If symptoms do not improve or worsen, please contact office for sooner follow up or seek emergency care.

## 2021-12-07 NOTE — Assessment & Plan Note (Signed)
Clinical and radiographic improvement. She has a persistent opacity at the right lung base, which appears to be related to asymmetric chest wall density related to capsulated right breast implant. She did have a mammogram recently and this breast implant is much more dense than the other. Given her resolution of infectious symptoms, do not think this is a persistent infiltrate. We will recheck imaging in 2 months to assess for stability.   Patient Instructions  Continue Stiolto 2 puffs daily Continue Albuterol inhaler 2 puffs or duoneb 3 mL neb every 6 hours as needed for shortness of breath or wheezing. Notify if symptoms persist despite rescue inhaler/neb use. Continue supplemental oxygen 2-3 lpm POC and continuous at night for goal oxygen >88-90% Continue Flutter valve 2-3 times a day as needed for chest congestion/cough   Follow up in 2 months with Dr. Delton Coombes. If symptoms do not improve or worsen, please contact office for sooner follow up or seek emergency care.

## 2021-12-10 DIAGNOSIS — Z79899 Other long term (current) drug therapy: Secondary | ICD-10-CM | POA: Diagnosis not present

## 2021-12-10 LAB — HEPATIC FUNCTION PANEL
ALT: 38 U/L — AB (ref 7–35)
AST: 32 (ref 13–35)
Alkaline Phosphatase: 122 (ref 25–125)
Bilirubin, Total: 0.3

## 2021-12-10 LAB — COMPREHENSIVE METABOLIC PANEL: Albumin: 3.5 (ref 3.5–5.0)

## 2021-12-11 DIAGNOSIS — R278 Other lack of coordination: Secondary | ICD-10-CM | POA: Diagnosis not present

## 2021-12-11 DIAGNOSIS — R2689 Other abnormalities of gait and mobility: Secondary | ICD-10-CM | POA: Diagnosis not present

## 2021-12-11 DIAGNOSIS — J9601 Acute respiratory failure with hypoxia: Secondary | ICD-10-CM | POA: Diagnosis not present

## 2021-12-11 DIAGNOSIS — I63312 Cerebral infarction due to thrombosis of left middle cerebral artery: Secondary | ICD-10-CM | POA: Diagnosis not present

## 2021-12-11 DIAGNOSIS — M62561 Muscle wasting and atrophy, not elsewhere classified, right lower leg: Secondary | ICD-10-CM | POA: Diagnosis not present

## 2021-12-11 DIAGNOSIS — I5032 Chronic diastolic (congestive) heart failure: Secondary | ICD-10-CM | POA: Diagnosis not present

## 2021-12-11 DIAGNOSIS — I69351 Hemiplegia and hemiparesis following cerebral infarction affecting right dominant side: Secondary | ICD-10-CM | POA: Diagnosis not present

## 2021-12-12 DIAGNOSIS — R482 Apraxia: Secondary | ICD-10-CM | POA: Diagnosis not present

## 2021-12-12 DIAGNOSIS — J9601 Acute respiratory failure with hypoxia: Secondary | ICD-10-CM | POA: Diagnosis not present

## 2021-12-12 DIAGNOSIS — I63312 Cerebral infarction due to thrombosis of left middle cerebral artery: Secondary | ICD-10-CM | POA: Diagnosis not present

## 2021-12-12 DIAGNOSIS — I69351 Hemiplegia and hemiparesis following cerebral infarction affecting right dominant side: Secondary | ICD-10-CM | POA: Diagnosis not present

## 2021-12-12 DIAGNOSIS — R471 Dysarthria and anarthria: Secondary | ICD-10-CM | POA: Diagnosis not present

## 2021-12-12 DIAGNOSIS — I480 Paroxysmal atrial fibrillation: Secondary | ICD-10-CM | POA: Diagnosis not present

## 2021-12-12 DIAGNOSIS — R2689 Other abnormalities of gait and mobility: Secondary | ICD-10-CM | POA: Diagnosis not present

## 2021-12-12 DIAGNOSIS — I5032 Chronic diastolic (congestive) heart failure: Secondary | ICD-10-CM | POA: Diagnosis not present

## 2021-12-12 DIAGNOSIS — I6602 Occlusion and stenosis of left middle cerebral artery: Secondary | ICD-10-CM | POA: Diagnosis not present

## 2021-12-13 ENCOUNTER — Encounter: Payer: Self-pay | Admitting: Internal Medicine

## 2021-12-13 DIAGNOSIS — R278 Other lack of coordination: Secondary | ICD-10-CM | POA: Diagnosis not present

## 2021-12-13 DIAGNOSIS — I69351 Hemiplegia and hemiparesis following cerebral infarction affecting right dominant side: Secondary | ICD-10-CM | POA: Diagnosis not present

## 2021-12-13 DIAGNOSIS — M6389 Disorders of muscle in diseases classified elsewhere, multiple sites: Secondary | ICD-10-CM | POA: Diagnosis not present

## 2021-12-13 DIAGNOSIS — R293 Abnormal posture: Secondary | ICD-10-CM | POA: Diagnosis not present

## 2021-12-13 DIAGNOSIS — R001 Bradycardia, unspecified: Secondary | ICD-10-CM | POA: Diagnosis not present

## 2021-12-13 DIAGNOSIS — I63312 Cerebral infarction due to thrombosis of left middle cerebral artery: Secondary | ICD-10-CM | POA: Diagnosis not present

## 2021-12-14 DIAGNOSIS — I69351 Hemiplegia and hemiparesis following cerebral infarction affecting right dominant side: Secondary | ICD-10-CM | POA: Diagnosis not present

## 2021-12-14 DIAGNOSIS — R278 Other lack of coordination: Secondary | ICD-10-CM | POA: Diagnosis not present

## 2021-12-14 DIAGNOSIS — R2689 Other abnormalities of gait and mobility: Secondary | ICD-10-CM | POA: Diagnosis not present

## 2021-12-14 DIAGNOSIS — I5032 Chronic diastolic (congestive) heart failure: Secondary | ICD-10-CM | POA: Diagnosis not present

## 2021-12-14 DIAGNOSIS — M62561 Muscle wasting and atrophy, not elsewhere classified, right lower leg: Secondary | ICD-10-CM | POA: Diagnosis not present

## 2021-12-14 DIAGNOSIS — I63312 Cerebral infarction due to thrombosis of left middle cerebral artery: Secondary | ICD-10-CM | POA: Diagnosis not present

## 2021-12-14 DIAGNOSIS — J9601 Acute respiratory failure with hypoxia: Secondary | ICD-10-CM | POA: Diagnosis not present

## 2021-12-18 DIAGNOSIS — I63312 Cerebral infarction due to thrombosis of left middle cerebral artery: Secondary | ICD-10-CM | POA: Diagnosis not present

## 2021-12-18 DIAGNOSIS — R2689 Other abnormalities of gait and mobility: Secondary | ICD-10-CM | POA: Diagnosis not present

## 2021-12-18 DIAGNOSIS — I69351 Hemiplegia and hemiparesis following cerebral infarction affecting right dominant side: Secondary | ICD-10-CM | POA: Diagnosis not present

## 2021-12-18 DIAGNOSIS — R278 Other lack of coordination: Secondary | ICD-10-CM | POA: Diagnosis not present

## 2021-12-18 DIAGNOSIS — J9601 Acute respiratory failure with hypoxia: Secondary | ICD-10-CM | POA: Diagnosis not present

## 2021-12-18 DIAGNOSIS — I5032 Chronic diastolic (congestive) heart failure: Secondary | ICD-10-CM | POA: Diagnosis not present

## 2021-12-18 DIAGNOSIS — M62561 Muscle wasting and atrophy, not elsewhere classified, right lower leg: Secondary | ICD-10-CM | POA: Diagnosis not present

## 2021-12-19 DIAGNOSIS — I69351 Hemiplegia and hemiparesis following cerebral infarction affecting right dominant side: Secondary | ICD-10-CM | POA: Diagnosis not present

## 2021-12-19 DIAGNOSIS — J9601 Acute respiratory failure with hypoxia: Secondary | ICD-10-CM | POA: Diagnosis not present

## 2021-12-19 DIAGNOSIS — I6602 Occlusion and stenosis of left middle cerebral artery: Secondary | ICD-10-CM | POA: Diagnosis not present

## 2021-12-19 DIAGNOSIS — I480 Paroxysmal atrial fibrillation: Secondary | ICD-10-CM | POA: Diagnosis not present

## 2021-12-19 DIAGNOSIS — I5032 Chronic diastolic (congestive) heart failure: Secondary | ICD-10-CM | POA: Diagnosis not present

## 2021-12-19 DIAGNOSIS — I63312 Cerebral infarction due to thrombosis of left middle cerebral artery: Secondary | ICD-10-CM | POA: Diagnosis not present

## 2021-12-19 DIAGNOSIS — R001 Bradycardia, unspecified: Secondary | ICD-10-CM | POA: Diagnosis not present

## 2021-12-19 DIAGNOSIS — M6389 Disorders of muscle in diseases classified elsewhere, multiple sites: Secondary | ICD-10-CM | POA: Diagnosis not present

## 2021-12-19 DIAGNOSIS — R2689 Other abnormalities of gait and mobility: Secondary | ICD-10-CM | POA: Diagnosis not present

## 2021-12-19 DIAGNOSIS — R278 Other lack of coordination: Secondary | ICD-10-CM | POA: Diagnosis not present

## 2021-12-19 DIAGNOSIS — R293 Abnormal posture: Secondary | ICD-10-CM | POA: Diagnosis not present

## 2021-12-19 DIAGNOSIS — R471 Dysarthria and anarthria: Secondary | ICD-10-CM | POA: Diagnosis not present

## 2021-12-19 DIAGNOSIS — R482 Apraxia: Secondary | ICD-10-CM | POA: Diagnosis not present

## 2021-12-20 DIAGNOSIS — R482 Apraxia: Secondary | ICD-10-CM | POA: Diagnosis not present

## 2021-12-20 DIAGNOSIS — I480 Paroxysmal atrial fibrillation: Secondary | ICD-10-CM | POA: Diagnosis not present

## 2021-12-20 DIAGNOSIS — R471 Dysarthria and anarthria: Secondary | ICD-10-CM | POA: Diagnosis not present

## 2021-12-20 DIAGNOSIS — I6602 Occlusion and stenosis of left middle cerebral artery: Secondary | ICD-10-CM | POA: Diagnosis not present

## 2021-12-21 DIAGNOSIS — I63312 Cerebral infarction due to thrombosis of left middle cerebral artery: Secondary | ICD-10-CM | POA: Diagnosis not present

## 2021-12-21 DIAGNOSIS — I5032 Chronic diastolic (congestive) heart failure: Secondary | ICD-10-CM | POA: Diagnosis not present

## 2021-12-21 DIAGNOSIS — J9601 Acute respiratory failure with hypoxia: Secondary | ICD-10-CM | POA: Diagnosis not present

## 2021-12-21 DIAGNOSIS — I69351 Hemiplegia and hemiparesis following cerebral infarction affecting right dominant side: Secondary | ICD-10-CM | POA: Diagnosis not present

## 2021-12-21 DIAGNOSIS — R2689 Other abnormalities of gait and mobility: Secondary | ICD-10-CM | POA: Diagnosis not present

## 2021-12-24 ENCOUNTER — Non-Acute Institutional Stay (SKILLED_NURSING_FACILITY): Payer: Medicare Other | Admitting: Internal Medicine

## 2021-12-24 ENCOUNTER — Encounter: Payer: Self-pay | Admitting: Internal Medicine

## 2021-12-24 DIAGNOSIS — I6602 Occlusion and stenosis of left middle cerebral artery: Secondary | ICD-10-CM | POA: Diagnosis not present

## 2021-12-24 DIAGNOSIS — J9601 Acute respiratory failure with hypoxia: Secondary | ICD-10-CM | POA: Diagnosis not present

## 2021-12-24 DIAGNOSIS — R293 Abnormal posture: Secondary | ICD-10-CM | POA: Diagnosis not present

## 2021-12-24 DIAGNOSIS — K5901 Slow transit constipation: Secondary | ICD-10-CM | POA: Diagnosis not present

## 2021-12-24 DIAGNOSIS — R482 Apraxia: Secondary | ICD-10-CM | POA: Diagnosis not present

## 2021-12-24 DIAGNOSIS — H00011 Hordeolum externum right upper eyelid: Secondary | ICD-10-CM

## 2021-12-24 DIAGNOSIS — I69351 Hemiplegia and hemiparesis following cerebral infarction affecting right dominant side: Secondary | ICD-10-CM | POA: Diagnosis not present

## 2021-12-24 DIAGNOSIS — R471 Dysarthria and anarthria: Secondary | ICD-10-CM | POA: Diagnosis not present

## 2021-12-24 DIAGNOSIS — R278 Other lack of coordination: Secondary | ICD-10-CM | POA: Diagnosis not present

## 2021-12-24 DIAGNOSIS — K8020 Calculus of gallbladder without cholecystitis without obstruction: Secondary | ICD-10-CM

## 2021-12-24 DIAGNOSIS — H00014 Hordeolum externum left upper eyelid: Secondary | ICD-10-CM

## 2021-12-24 DIAGNOSIS — M6389 Disorders of muscle in diseases classified elsewhere, multiple sites: Secondary | ICD-10-CM | POA: Diagnosis not present

## 2021-12-24 DIAGNOSIS — I480 Paroxysmal atrial fibrillation: Secondary | ICD-10-CM | POA: Diagnosis not present

## 2021-12-24 DIAGNOSIS — R2689 Other abnormalities of gait and mobility: Secondary | ICD-10-CM | POA: Diagnosis not present

## 2021-12-24 DIAGNOSIS — M62561 Muscle wasting and atrophy, not elsewhere classified, right lower leg: Secondary | ICD-10-CM | POA: Diagnosis not present

## 2021-12-24 DIAGNOSIS — R001 Bradycardia, unspecified: Secondary | ICD-10-CM | POA: Diagnosis not present

## 2021-12-24 DIAGNOSIS — I5032 Chronic diastolic (congestive) heart failure: Secondary | ICD-10-CM | POA: Diagnosis not present

## 2021-12-24 DIAGNOSIS — I63312 Cerebral infarction due to thrombosis of left middle cerebral artery: Secondary | ICD-10-CM | POA: Diagnosis not present

## 2021-12-24 NOTE — Progress Notes (Unsigned)
Location: [facility] of Service:  SNF (31)  Provider:   Code Status: Full Code Goals of Care:     11/27/2021    4:47 PM  Advanced Directives  Does Patient Have a Medical Advance Directive? No  Does patient want to make changes to medical advance directive? No - Patient declined     Chief Complaint  Patient presents with   Acute Visit    HPI: Patient is a 76 y.o. 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 LFTS normal No Nausea or vomiting or fever  She also continues to have issue with constipation Past Medical History:  Diagnosis Date   Anxiety    Arthritis    "some in my lower back; probably elbows, knees" (11/18/2017)   Atrial fibrillation (HCC)    Bell's palsy    when pt. was 76 yrs old, when under stress the left side of face will droop.   Complication of anesthesia    "vascular OR 2016; BP bottomed out; couldn't get it regulated; ended up in ICU for DAYS" (11/18/2017)   GERD (gastroesophageal reflux disease)    History of kidney stones    Hypertension    Hypertrophic cardiomyopathy (HCC)    severe LV basilar hypertrophy witn no evidence of significant outflow tract obstruction, EF 65-70%, mild LAE, mild TR, grade 1a diastolic dysfunction [id number] (Dr. Candee Furbish) (Atrial Septal Hypertrophy pattern)-- Intra-op TEE with dsignificant outflow tract  obstruction - AI, MR & TR   Insomnia    Mild aortic sclerosis    Osteopenia    Peripheral vascular disease (HCC)    Syncope    , Vagal    Past Surgical History:  Procedure Laterality Date   AUGMENTATION MAMMAPLASTY Bilateral    BACK SURGERY     CARDIAC CATHETERIZATION N/A 05/07/2016   Procedure: Left Heart Cath and Coronary Angiography;  Surgeon: Burnell Blanks, MD;  Location: [place] [facility];  Service: Cardiovascular;  Laterality: N/A;   CARDIOVERSION N/A 09/24/2017   Procedure: CARDIOVERSION;  Surgeon: Larey Dresser, MD;  Location: [facility] ENDOSCOPY;  Service: Cardiovascular;  Laterality: N/A;   DILATION AND CURETTAGE OF UTERUS     ENDARTERECTOMY FEMORAL Right 03/02/2015   Procedure: ENDARTERECTOMY RIGHT FEMORAL;  Surgeon: Angelia Mould, MD;  Location: [place];  Service: Vascular;  Laterality: Right;   ESOPHAGOGASTRODUODENOSCOPY (EGD) WITH PROPOFOL N/A 12/01/2020   Procedure: ESOPHAGOGASTRODUODENOSCOPY (EGD) WITH PROPOFOL;  Surgeon: Jesusita Oka, MD;  Location: [place] ENDOSCOPY;  Service: General;  Laterality: N/A;   FACIAL COSMETIC SURGERY Left 2002   "related to [place] @ age 44; left eye/side of face droopy; tried to make area symmetrical"   FEMORAL-POPLITEAL BYPASS GRAFT Right 03/02/2015   Procedure: BYPASS GRAFT FEMORAL-BELOW KNEE POPLITEAL ARTERY;  Surgeon: Angelia Mould, MD;  Location: [place];  Service: Vascular;  Laterality: Right;   INGUINAL HERNIA REPAIR Bilateral 2002   IR ANGIO INTRA EXTRACRAN SEL COM CAROTID INNOMINATE UNI L MOD SED  11/16/2020   IR CT HEAD LTD  11/13/2020   IR PERCUTANEOUS ART THROMBECTOMY/INFUSION INTRACRANIAL INC DIAG ANGIO  11/13/2020   OVARIAN CYST REMOVAL Left    PEG PLACEMENT N/A 12/01/2020   Procedure: PERCUTANEOUS ENDOSCOPIC GASTROSTOMY (PEG) PLACEMENT;  Surgeon: Diamantina Monks, MD;  Location: [facility] ENDOSCOPY;  Service: General;  Laterality: N/A;   PERIPHERAL VASCULAR CATHETERIZATION N/A 01/16/2015   Procedure: Abdominal  Aortogram;  Surgeon: Chuck Hint, MD;  Location: [facility]IVE CV LAB;  Service: Cardiovascular;  Laterality: N/A;   POSTERIOR LUMBAR FUSION  2015   "have plates and screws in there"   RADIOLOGY WITH ANESTHESIA N/A 11/13/2020   Procedure: IR WITH ANESTHESIA;  Surgeon: Radiologist, Medication, MD;  Location: [facility] OR;  Service: Radiology;  Laterality: N/A;   TONSILLECTOMY      Allergies  Allergen Reactions   Amoxicillin Other (See Comments)    UTI Has patient had a PCN reaction causing immediate rash, facial/tongue/throat swelling, SOB or lightheadedness with hypotension: No Has patient had a PCN reaction causing severe rash involving mucus membranes or skin necrosis: No Has patient had a PCN reaction that required hospitalization: No Has patient had a PCN reaction occurring within the last 10 years: Yes--UTI ONLY If all of the above answers are "NO", then may proceed with Cephalosporin use.    Atenolol Cough   Crestor [Rosuvastatin Calcium] Other (See Comments)    Muscle aches   Pravastatin Other (See Comments)    Muscle aches   Sulfa Antibiotics Nausea Only   Codeine Other (See Comments)    hallucinations    Outpatient Encounter Medications as of 12/24/2021  Medication Sig   acetaminophen (TYLENOL) 325 MG tablet Take 650 mg by mouth 2 (two) times daily as needed for mild pain.   aluminum-magnesium hydroxide-simethicone (MAALOX) 200-200-20 MG/5ML SUSP Take 30 mLs by mouth every 4 (four) hours as needed (heartburn).   amiodarone (PACERONE) 200 MG tablet Take 200 mg by mouth daily. May give crushed   amLODipine (NORVASC) 5 MG tablet Take 5 mg by mouth daily.   apixaban (ELIQUIS) 5 MG TABS tablet Take 5 mg by mouth 2 (two) times daily. May give crushed   bisacodyl (DULCOLAX) 5 MG EC tablet Take 5 mg by mouth 3 (three) times daily as needed for moderate constipation.   buPROPion ER (WELLBUTRIN SR) 100 MG 12 hr tablet Take 100 mg by mouth 2 (two) times daily.   diclofenac Sodium  (VOLTAREN) 1 % GEL Apply one application to right hip and knee four times daily as needed.   ezetimibe (ZETIA) 10 MG tablet Take 10 mg by mouth daily. Oral if crushed   furosemide (LASIX) 20 MG tablet Take 20 mg by mouth daily as needed. Give Lasix 20mg  for weight gain equal or greater than 3 pounds   hydrALAZINE (APRESOLINE) 10 MG tablet Take 10 mg by mouth 3 (three) times daily as needed (SBP > 165 or DBP > 95).   ipratropium-albuterol (DUONEB) 0.5-2.5 (3) MG/3ML SOLN Inhale 3 mLs into the lungs every 6 (six) hours as needed.   ketoconazole (NIZORAL) 2 % cream Apply 1 application  topically daily as needed for irritation.   nystatin cream (MYCOSTATIN) Apply 1 application topically 2 (two) times daily as needed for dry skin (apply to perirectal rash).   ondansetron (ZOFRAN) 4 MG tablet Take 4 mg by mouth every  6 (six) hours as needed for nausea or vomiting.   OXYGEN Inhale 3 L into the lungs as directed. to maintain sats > or equal to 90%. May titrate Three Times A Day; 07:00 AM - 03:00 PM, 03:00 PM - 11:00 PM, 11:00 PM - 7:00 am   pantoprazole (PROTONIX) 40 MG tablet Take 40 mg by mouth 2 (two) times daily.   Polyethyl Glycol-Propyl Glycol (SYSTANE) 0.4-0.3 % SOLN Place 2 drops into both eyes 4 (four) times daily as needed (irritation).   polyethylene glycol powder (GLYCOLAX/MIRALAX) 17 GM/SCOOP powder Take 17 g by mouth every other day.   Tiotropium Bromide-Olodaterol (STIOLTO RESPIMAT) 2.5-2.5 MCG/ACT AERS Inhale 2 puffs into the lungs daily.   zinc oxide 20 % ointment Apply 1 application. topically 2 (two) times daily as needed for irritation (apply to perirectal area prn - use with nystatin).   Facility-Administered Encounter Medications as of 12/24/2021  Medication   triamcinolone acetonide (KENALOG) 10 MG/ML injection 10 mg    Review of Systems:  Review of Systems  Constitutional:  Negative for activity change and appetite change.  HENT: Negative.    Respiratory:  Negative for cough  and shortness of breath.   Cardiovascular:  Negative for leg swelling.  Gastrointestinal:  Positive for constipation.  Genitourinary: Negative.   Musculoskeletal:  Positive for gait problem. Negative for arthralgias and myalgias.  Skin: Negative.   Neurological:  Negative for dizziness and weakness.  Psychiatric/Behavioral:  Negative for confusion, dysphoric mood and sleep disturbance.     Health Maintenance  Topic Date Due   Zoster Vaccines- Shingrix (1 of 2) Never done   COVID-19 Vaccine (6 - Pfizer risk series) 04/25/2021   INFLUENZA VACCINE  11/13/2021   Hepatitis C Screening  04/15/2022 (Originally 04/25/1963)   TETANUS/TDAP  05/20/2023   Pneumonia Vaccine 42+ Years old  Completed   HPV VACCINES  Aged Out   DEXA SCAN  Discontinued    Physical Exam: Vitals:   12/24/21 1454  BP: 134/74  Pulse: (!) 59  Resp: 17  Temp: (!) 97.1 F (36.2 C)  SpO2: 95%  Weight: 94 lb (42.6 kg)   Body mass index is 15.64 kg/m. Physical Exam Vitals reviewed.  Constitutional:      Appearance: Normal appearance.  HENT:     Head: Normocephalic.     Nose: Nose normal.     Mouth/Throat:     Mouth: Mucous membranes are moist.     Pharynx: Oropharynx is clear.  Eyes:     Pupils: Pupils are equal, round, and reactive to light.     Comments: Stye in Right Upper lid  Cardiovascular:     Rate and Rhythm: Normal rate and regular rhythm.     Pulses: Normal pulses.     Heart sounds: Normal heart sounds. No murmur heard. Pulmonary:     Effort: Pulmonary effort is normal.     Breath sounds: Normal breath sounds.  Abdominal:     General: Abdomen is flat. Bowel sounds are normal.     Palpations: Abdomen is soft.     Tenderness: There is no abdominal tenderness.  Musculoskeletal:        General: No swelling.     Cervical back: Neck supple.  Skin:    General: Skin is warm.  Neurological:     Mental Status: She is alert.  Psychiatric:        Mood and Affect: Mood normal.        Thought  Content: Thought content normal.  Labs reviewed: Basic Metabolic Panel: Recent Labs    01/23/21 0259 01/30/21 0000 07/06/21 1605 07/06/21 1612 07/06/21 2059 07/07/21 0800 07/08/21 0717 07/09/21 0317 07/11/21 0000 09/18/21 0000 12/03/21 0000  NA 135   < > 138   < >  --  140 141 141 141 135* 139  K 3.6   < > 4.4   < >  --  4.3 4.0 3.8 4.1 5.1 4.4  CL 94*   < > 104   < >  --  107 105 109 103 101 104  CO2 28   < > 30  --   --  27 28 29  26* 27* 27*  GLUCOSE 85   < > 92   < >  --  80 87 90  --   --   --   BUN 14   < > 30*   < >  --  26* 22 21 24* 32* 28*  CREATININE 0.85   < > 0.98   < >  --  0.79 0.71 0.73 0.7 0.9 0.9  CALCIUM 8.7*   < > 8.8*  --   --  8.7* 9.0 8.7* 9.3 8.8 9.0  MG 2.0  --  2.1  --   --   --  1.9 1.9  --   --   --   PHOS 4.1  --   --   --   --   --   --   --   --   --   --   TSH  --   --   --   --  0.632  --   --   --   --   --   --    < > = values in this interval not displayed.   Liver Function Tests: Recent Labs    01/22/21 0651 01/23/21 0259 03/26/21 0000 07/06/21 1605 12/03/21 0000 12/10/21 0000  AST 39 28   < > 33 38* 32  ALT 38 34   < > 45* 48* 38*  ALKPHOS 68 57   < > 76 122 122  BILITOT 1.3* 1.5*  --  0.7  --   --   PROT 6.1* 5.6*  --  6.2*  --   --   ALBUMIN 3.4* 3.1*   < > 3.2* 3.4* 3.5   < > = values in this interval not displayed.   No results for input(s): "LIPASE", "AMYLASE" in the last 8760 hours. No results for input(s): "AMMONIA" in the last 8760 hours. CBC: Recent Labs    01/22/21 0651 01/22/21 0658 02/02/21 0519 03/26/21 0000 07/06/21 1605 07/06/21 1612 07/08/21 0717 07/09/21 0317 07/11/21 0000  WBC 10.7*   < > 9.3   < > 9.3  --  9.8 9.5 8.3  NEUTROABS 7.7  --  7.2  --  6.5  --   --   --   --   HGB 15.5*   < > 14.4   < > 14.6   < > 14.9 14.3 14.1  HCT 48.4*   < > 45.3   < > 45.2   < > 45.4 43.7 42  MCV 96.4   < > 95.4  --  93.0  --  91.2 90.7  --   PLT 237   < > 286   < > 241  --  235 237 224   < > = values in  this interval not displayed.   Lipid Panel: Recent Labs  11/22/21 0000 11/22/21 0600  CHOL 182 182  HDL 80* 80*  LDLCALC 89 89  TRIG 64 64   Lab Results  Component Value Date   HGBA1C 6.0 (H) 11/13/2020    Procedures since last visit: DG Chest 2 View  Result Date: 12/07/2021 CLINICAL DATA:  Follow-up multifocal pneumonia EXAM: CHEST - 2 VIEW COMPARISON:  10/08/2021 FINDINGS: Normal heart size and pulmonary vascularity. Tortuous thoracic aorta with atherosclerotic calcifications. Emphysematous and bronchitic changes consistent with COPD. Persistent opacity at lateral RIGHT lung base on AP view, not localized on lateral view, question asymmetric density related to RIGHT breast prosthesis with capsular calcification. Remaining lungs clear. No pleural effusion or pneumothorax. Osseous demineralization with calcified loose body inferior to the LEFT coracoid process. Height losses of multiple thoracic vertebra unchanged. IMPRESSION: COPD changes with persistent opacity at lateral RIGHT lung base, not localized on lateral view, suspect may represent asymmetric chest wall density related to RIGHT breast prosthesis with capsular calcification rather than persistent infiltrate. Recommend correlation with patient symptoms and consider additional follow-up imaging in 1-2 months for assess for persistence versus resolution. Electronically Signed   By: Lavonia Dana M.D.   On: 12/07/2021 15:26   VAS US CAROTID  Result Date: 12/04/2021 Carotid Arterial Duplex Study Patient Name:  Beverly Li  Date of Exam:   12/03/2021 Medical Rec #: [id number]        Accession #:    [id number] Date of Birth: 1945/09/18        Patient Gender: F Patient Age:   24 years Exam Location:  [facility] Procedure:      VAS US CAROTID Referring Phys: JESSICA MCCUE --------------------------------------------------------------------------------  Indications:       Carotid artery disease. Risk Factors:      Hypertension, PAD.  Comparison Study:  02/12/2018- carotid artery duplex 1-39% bilateral ICA                    stenosis Performing Technologist: Maudry Mayhew MHA, RDMS, RVT, RDCS  Examination Guidelines: A complete evaluation includes B-mode imaging, spectral Doppler, color Doppler, and power Doppler as needed of all accessible portions of each vessel. Bilateral testing is considered an integral part of a complete examination. Limited examinations for reoccurring indications may be performed as noted.  Right Carotid Findings: +----------+--------+--------+--------+------------------+---------------------+           PSV cm/sEDV cm/sStenosisPlaque DescriptionComments              +----------+--------+--------+--------+------------------+---------------------+ CCA Prox  30                                        intimal thickening    +----------+--------+--------+--------+------------------+---------------------+ CCA Distal18                                                              +----------+--------+--------+--------+------------------+---------------------+ ICA Prox  32                                        absent diastolic flow +----------+--------+--------+--------+------------------+---------------------+ ICA Distal9  thumped waveform      +----------+--------+--------+--------+------------------+---------------------+ ECA       139                                                             +----------+--------+--------+--------+------------------+---------------------+ +----------+--------+-------+----------------+-------------------+           PSV cm/sEDV cmsDescribe        Arm Pressure (mmHG) +----------+--------+-------+----------------+-------------------+ [id number]             Multiphasic, WNL                    +----------+--------+-------+----------------+-------------------+  +---------+--------+--+--------+-+---------+ VertebralPSV cm/s76EDV cm/s8Antegrade +---------+--------+--+--------+-+---------+  Left Carotid Findings: +----------+--------+--------+--------+--------------------------+--------+           PSV cm/sEDV cm/sStenosisPlaque Description        Comments +----------+--------+--------+--------+--------------------------+--------+ CCA Prox  53      8               heterogenous                       +----------+--------+--------+--------+--------------------------+--------+ CCA Distal37      11              smooth and heterogenous            +----------+--------+--------+--------+--------------------------+--------+ ICA Prox  47      16              heterogenous and irregular         +----------+--------+--------+--------+--------------------------+--------+ ICA Mid   80      25                                                 +----------+--------+--------+--------+--------------------------+--------+ ICA Distal60      19                                                 +----------+--------+--------+--------+--------------------------+--------+ ECA       36                      heterogenous                       +----------+--------+--------+--------+--------------------------+--------+ +----------+--------+--------+----------------+-------------------+           PSV cm/sEDV cm/sDescribe        Arm Pressure (mmHG) +----------+--------+--------+----------------+-------------------+ Subclavian190             Multiphasic, WNL                    +----------+--------+--------+----------------+-------------------+ +---------+--------+--+--------+--+---------+ VertebralPSV cm/s47EDV cm/s10Antegrade +---------+--------+--+--------+--+---------+   Summary: Right Carotid: Significantly dampened ICA flow, suggestive of possible distal                occlusion. Left Carotid: Velocities in the left ICA are consistent  with a 1-39% stenosis. Vertebrals:  Bilateral vertebral arteries demonstrate antegrade flow. Subclavians: Normal flow hemodynamics were seen in bilateral subclavian              arteries. *See  table(s) above for measurements and observations.  Electronically signed by Orlie Pollen on 12/04/2021 at 4:12:15 AM.    Final     Assessment/Plan 1. Hordeolum externum of left upper eyelid Eye Compresses   2. Gall stones Discussed wants to wait   3. Slow transit constipation Change Miralax ot QD    Labs/tests ordered:  * No order type specified * Next appt:  Visit date not found

## 2021-12-26 DIAGNOSIS — I69351 Hemiplegia and hemiparesis following cerebral infarction affecting right dominant side: Secondary | ICD-10-CM | POA: Diagnosis not present

## 2021-12-26 DIAGNOSIS — J9601 Acute respiratory failure with hypoxia: Secondary | ICD-10-CM | POA: Diagnosis not present

## 2021-12-26 DIAGNOSIS — I5032 Chronic diastolic (congestive) heart failure: Secondary | ICD-10-CM | POA: Diagnosis not present

## 2021-12-26 DIAGNOSIS — R2689 Other abnormalities of gait and mobility: Secondary | ICD-10-CM | POA: Diagnosis not present

## 2021-12-26 DIAGNOSIS — I63312 Cerebral infarction due to thrombosis of left middle cerebral artery: Secondary | ICD-10-CM | POA: Diagnosis not present

## 2021-12-27 DIAGNOSIS — R001 Bradycardia, unspecified: Secondary | ICD-10-CM | POA: Diagnosis not present

## 2021-12-27 DIAGNOSIS — R278 Other lack of coordination: Secondary | ICD-10-CM | POA: Diagnosis not present

## 2021-12-27 DIAGNOSIS — I69351 Hemiplegia and hemiparesis following cerebral infarction affecting right dominant side: Secondary | ICD-10-CM | POA: Diagnosis not present

## 2021-12-27 DIAGNOSIS — M6389 Disorders of muscle in diseases classified elsewhere, multiple sites: Secondary | ICD-10-CM | POA: Diagnosis not present

## 2021-12-27 DIAGNOSIS — I63312 Cerebral infarction due to thrombosis of left middle cerebral artery: Secondary | ICD-10-CM | POA: Diagnosis not present

## 2021-12-28 DIAGNOSIS — I69351 Hemiplegia and hemiparesis following cerebral infarction affecting right dominant side: Secondary | ICD-10-CM | POA: Diagnosis not present

## 2021-12-28 DIAGNOSIS — I480 Paroxysmal atrial fibrillation: Secondary | ICD-10-CM | POA: Diagnosis not present

## 2021-12-28 DIAGNOSIS — R2689 Other abnormalities of gait and mobility: Secondary | ICD-10-CM | POA: Diagnosis not present

## 2021-12-28 DIAGNOSIS — M6389 Disorders of muscle in diseases classified elsewhere, multiple sites: Secondary | ICD-10-CM | POA: Diagnosis not present

## 2021-12-28 DIAGNOSIS — R471 Dysarthria and anarthria: Secondary | ICD-10-CM | POA: Diagnosis not present

## 2021-12-28 DIAGNOSIS — I6602 Occlusion and stenosis of left middle cerebral artery: Secondary | ICD-10-CM | POA: Diagnosis not present

## 2021-12-28 DIAGNOSIS — R482 Apraxia: Secondary | ICD-10-CM | POA: Diagnosis not present

## 2021-12-28 DIAGNOSIS — R001 Bradycardia, unspecified: Secondary | ICD-10-CM | POA: Diagnosis not present

## 2021-12-28 DIAGNOSIS — R278 Other lack of coordination: Secondary | ICD-10-CM | POA: Diagnosis not present

## 2021-12-28 DIAGNOSIS — J9601 Acute respiratory failure with hypoxia: Secondary | ICD-10-CM | POA: Diagnosis not present

## 2021-12-28 DIAGNOSIS — I63312 Cerebral infarction due to thrombosis of left middle cerebral artery: Secondary | ICD-10-CM | POA: Diagnosis not present

## 2021-12-28 DIAGNOSIS — I5032 Chronic diastolic (congestive) heart failure: Secondary | ICD-10-CM | POA: Diagnosis not present

## 2022-01-01 DIAGNOSIS — J9601 Acute respiratory failure with hypoxia: Secondary | ICD-10-CM | POA: Diagnosis not present

## 2022-01-01 DIAGNOSIS — R2689 Other abnormalities of gait and mobility: Secondary | ICD-10-CM | POA: Diagnosis not present

## 2022-01-01 DIAGNOSIS — I69351 Hemiplegia and hemiparesis following cerebral infarction affecting right dominant side: Secondary | ICD-10-CM | POA: Diagnosis not present

## 2022-01-01 DIAGNOSIS — I5032 Chronic diastolic (congestive) heart failure: Secondary | ICD-10-CM | POA: Diagnosis not present

## 2022-01-01 DIAGNOSIS — I63312 Cerebral infarction due to thrombosis of left middle cerebral artery: Secondary | ICD-10-CM | POA: Diagnosis not present

## 2022-01-02 DIAGNOSIS — R278 Other lack of coordination: Secondary | ICD-10-CM | POA: Diagnosis not present

## 2022-01-02 DIAGNOSIS — I5032 Chronic diastolic (congestive) heart failure: Secondary | ICD-10-CM | POA: Diagnosis not present

## 2022-01-02 DIAGNOSIS — I480 Paroxysmal atrial fibrillation: Secondary | ICD-10-CM | POA: Diagnosis not present

## 2022-01-02 DIAGNOSIS — R482 Apraxia: Secondary | ICD-10-CM | POA: Diagnosis not present

## 2022-01-02 DIAGNOSIS — I6602 Occlusion and stenosis of left middle cerebral artery: Secondary | ICD-10-CM | POA: Diagnosis not present

## 2022-01-02 DIAGNOSIS — I69351 Hemiplegia and hemiparesis following cerebral infarction affecting right dominant side: Secondary | ICD-10-CM | POA: Diagnosis not present

## 2022-01-02 DIAGNOSIS — R293 Abnormal posture: Secondary | ICD-10-CM | POA: Diagnosis not present

## 2022-01-02 DIAGNOSIS — J9601 Acute respiratory failure with hypoxia: Secondary | ICD-10-CM | POA: Diagnosis not present

## 2022-01-02 DIAGNOSIS — I63312 Cerebral infarction due to thrombosis of left middle cerebral artery: Secondary | ICD-10-CM | POA: Diagnosis not present

## 2022-01-02 DIAGNOSIS — R471 Dysarthria and anarthria: Secondary | ICD-10-CM | POA: Diagnosis not present

## 2022-01-02 DIAGNOSIS — M6389 Disorders of muscle in diseases classified elsewhere, multiple sites: Secondary | ICD-10-CM | POA: Diagnosis not present

## 2022-01-02 DIAGNOSIS — R001 Bradycardia, unspecified: Secondary | ICD-10-CM | POA: Diagnosis not present

## 2022-01-02 DIAGNOSIS — R2689 Other abnormalities of gait and mobility: Secondary | ICD-10-CM | POA: Diagnosis not present

## 2022-01-04 DIAGNOSIS — I69351 Hemiplegia and hemiparesis following cerebral infarction affecting right dominant side: Secondary | ICD-10-CM | POA: Diagnosis not present

## 2022-01-04 DIAGNOSIS — I63312 Cerebral infarction due to thrombosis of left middle cerebral artery: Secondary | ICD-10-CM | POA: Diagnosis not present

## 2022-01-04 DIAGNOSIS — R278 Other lack of coordination: Secondary | ICD-10-CM | POA: Diagnosis not present

## 2022-01-04 DIAGNOSIS — M6389 Disorders of muscle in diseases classified elsewhere, multiple sites: Secondary | ICD-10-CM | POA: Diagnosis not present

## 2022-01-04 DIAGNOSIS — R001 Bradycardia, unspecified: Secondary | ICD-10-CM | POA: Diagnosis not present

## 2022-01-07 DIAGNOSIS — I480 Paroxysmal atrial fibrillation: Secondary | ICD-10-CM | POA: Diagnosis not present

## 2022-01-07 DIAGNOSIS — R482 Apraxia: Secondary | ICD-10-CM | POA: Diagnosis not present

## 2022-01-07 DIAGNOSIS — R293 Abnormal posture: Secondary | ICD-10-CM | POA: Diagnosis not present

## 2022-01-07 DIAGNOSIS — I6602 Occlusion and stenosis of left middle cerebral artery: Secondary | ICD-10-CM | POA: Diagnosis not present

## 2022-01-07 DIAGNOSIS — R278 Other lack of coordination: Secondary | ICD-10-CM | POA: Diagnosis not present

## 2022-01-07 DIAGNOSIS — J9601 Acute respiratory failure with hypoxia: Secondary | ICD-10-CM | POA: Diagnosis not present

## 2022-01-07 DIAGNOSIS — M6389 Disorders of muscle in diseases classified elsewhere, multiple sites: Secondary | ICD-10-CM | POA: Diagnosis not present

## 2022-01-07 DIAGNOSIS — I63312 Cerebral infarction due to thrombosis of left middle cerebral artery: Secondary | ICD-10-CM | POA: Diagnosis not present

## 2022-01-07 DIAGNOSIS — M62561 Muscle wasting and atrophy, not elsewhere classified, right lower leg: Secondary | ICD-10-CM | POA: Diagnosis not present

## 2022-01-07 DIAGNOSIS — R471 Dysarthria and anarthria: Secondary | ICD-10-CM | POA: Diagnosis not present

## 2022-01-07 DIAGNOSIS — I5032 Chronic diastolic (congestive) heart failure: Secondary | ICD-10-CM | POA: Diagnosis not present

## 2022-01-07 DIAGNOSIS — I69351 Hemiplegia and hemiparesis following cerebral infarction affecting right dominant side: Secondary | ICD-10-CM | POA: Diagnosis not present

## 2022-01-07 DIAGNOSIS — R2689 Other abnormalities of gait and mobility: Secondary | ICD-10-CM | POA: Diagnosis not present

## 2022-01-07 DIAGNOSIS — R001 Bradycardia, unspecified: Secondary | ICD-10-CM | POA: Diagnosis not present

## 2022-01-08 DIAGNOSIS — I63312 Cerebral infarction due to thrombosis of left middle cerebral artery: Secondary | ICD-10-CM | POA: Diagnosis not present

## 2022-01-08 DIAGNOSIS — M6389 Disorders of muscle in diseases classified elsewhere, multiple sites: Secondary | ICD-10-CM | POA: Diagnosis not present

## 2022-01-08 DIAGNOSIS — R001 Bradycardia, unspecified: Secondary | ICD-10-CM | POA: Diagnosis not present

## 2022-01-08 DIAGNOSIS — I69351 Hemiplegia and hemiparesis following cerebral infarction affecting right dominant side: Secondary | ICD-10-CM | POA: Diagnosis not present

## 2022-01-08 DIAGNOSIS — R278 Other lack of coordination: Secondary | ICD-10-CM | POA: Diagnosis not present

## 2022-01-09 DIAGNOSIS — M6389 Disorders of muscle in diseases classified elsewhere, multiple sites: Secondary | ICD-10-CM | POA: Diagnosis not present

## 2022-01-09 DIAGNOSIS — I63312 Cerebral infarction due to thrombosis of left middle cerebral artery: Secondary | ICD-10-CM | POA: Diagnosis not present

## 2022-01-09 DIAGNOSIS — I5032 Chronic diastolic (congestive) heart failure: Secondary | ICD-10-CM | POA: Diagnosis not present

## 2022-01-09 DIAGNOSIS — J9601 Acute respiratory failure with hypoxia: Secondary | ICD-10-CM | POA: Diagnosis not present

## 2022-01-09 DIAGNOSIS — R2689 Other abnormalities of gait and mobility: Secondary | ICD-10-CM | POA: Diagnosis not present

## 2022-01-09 DIAGNOSIS — R278 Other lack of coordination: Secondary | ICD-10-CM | POA: Diagnosis not present

## 2022-01-09 DIAGNOSIS — I69351 Hemiplegia and hemiparesis following cerebral infarction affecting right dominant side: Secondary | ICD-10-CM | POA: Diagnosis not present

## 2022-01-09 DIAGNOSIS — R001 Bradycardia, unspecified: Secondary | ICD-10-CM | POA: Diagnosis not present

## 2022-01-10 DIAGNOSIS — I480 Paroxysmal atrial fibrillation: Secondary | ICD-10-CM | POA: Diagnosis not present

## 2022-01-10 DIAGNOSIS — I6602 Occlusion and stenosis of left middle cerebral artery: Secondary | ICD-10-CM | POA: Diagnosis not present

## 2022-01-10 DIAGNOSIS — R482 Apraxia: Secondary | ICD-10-CM | POA: Diagnosis not present

## 2022-01-10 DIAGNOSIS — R471 Dysarthria and anarthria: Secondary | ICD-10-CM | POA: Diagnosis not present

## 2022-01-11 DIAGNOSIS — I63312 Cerebral infarction due to thrombosis of left middle cerebral artery: Secondary | ICD-10-CM | POA: Diagnosis not present

## 2022-01-11 DIAGNOSIS — M6389 Disorders of muscle in diseases classified elsewhere, multiple sites: Secondary | ICD-10-CM | POA: Diagnosis not present

## 2022-01-11 DIAGNOSIS — R001 Bradycardia, unspecified: Secondary | ICD-10-CM | POA: Diagnosis not present

## 2022-01-11 DIAGNOSIS — R278 Other lack of coordination: Secondary | ICD-10-CM | POA: Diagnosis not present

## 2022-01-11 DIAGNOSIS — J9601 Acute respiratory failure with hypoxia: Secondary | ICD-10-CM | POA: Diagnosis not present

## 2022-01-11 DIAGNOSIS — I5032 Chronic diastolic (congestive) heart failure: Secondary | ICD-10-CM | POA: Diagnosis not present

## 2022-01-11 DIAGNOSIS — R2689 Other abnormalities of gait and mobility: Secondary | ICD-10-CM | POA: Diagnosis not present

## 2022-01-11 DIAGNOSIS — I69351 Hemiplegia and hemiparesis following cerebral infarction affecting right dominant side: Secondary | ICD-10-CM | POA: Diagnosis not present

## 2022-01-14 DIAGNOSIS — R471 Dysarthria and anarthria: Secondary | ICD-10-CM | POA: Diagnosis not present

## 2022-01-14 DIAGNOSIS — I69351 Hemiplegia and hemiparesis following cerebral infarction affecting right dominant side: Secondary | ICD-10-CM | POA: Diagnosis not present

## 2022-01-14 DIAGNOSIS — R482 Apraxia: Secondary | ICD-10-CM | POA: Diagnosis not present

## 2022-01-14 DIAGNOSIS — R0602 Shortness of breath: Secondary | ICD-10-CM | POA: Diagnosis not present

## 2022-01-14 DIAGNOSIS — I63312 Cerebral infarction due to thrombosis of left middle cerebral artery: Secondary | ICD-10-CM | POA: Diagnosis not present

## 2022-01-14 DIAGNOSIS — I5032 Chronic diastolic (congestive) heart failure: Secondary | ICD-10-CM | POA: Diagnosis not present

## 2022-01-14 DIAGNOSIS — M6389 Disorders of muscle in diseases classified elsewhere, multiple sites: Secondary | ICD-10-CM | POA: Diagnosis not present

## 2022-01-14 DIAGNOSIS — I480 Paroxysmal atrial fibrillation: Secondary | ICD-10-CM | POA: Diagnosis not present

## 2022-01-14 DIAGNOSIS — J9601 Acute respiratory failure with hypoxia: Secondary | ICD-10-CM | POA: Diagnosis not present

## 2022-01-14 DIAGNOSIS — M62561 Muscle wasting and atrophy, not elsewhere classified, right lower leg: Secondary | ICD-10-CM | POA: Diagnosis not present

## 2022-01-14 DIAGNOSIS — I6602 Occlusion and stenosis of left middle cerebral artery: Secondary | ICD-10-CM | POA: Diagnosis not present

## 2022-01-14 DIAGNOSIS — R001 Bradycardia, unspecified: Secondary | ICD-10-CM | POA: Diagnosis not present

## 2022-01-14 DIAGNOSIS — R293 Abnormal posture: Secondary | ICD-10-CM | POA: Diagnosis not present

## 2022-01-14 DIAGNOSIS — R2689 Other abnormalities of gait and mobility: Secondary | ICD-10-CM | POA: Diagnosis not present

## 2022-01-14 DIAGNOSIS — R278 Other lack of coordination: Secondary | ICD-10-CM | POA: Diagnosis not present

## 2022-01-15 ENCOUNTER — Non-Acute Institutional Stay (SKILLED_NURSING_FACILITY): Payer: Medicare Other | Admitting: Orthopedic Surgery

## 2022-01-15 ENCOUNTER — Encounter: Payer: Self-pay | Admitting: Orthopedic Surgery

## 2022-01-15 DIAGNOSIS — J90 Pleural effusion, not elsewhere classified: Secondary | ICD-10-CM | POA: Diagnosis not present

## 2022-01-15 DIAGNOSIS — R0602 Shortness of breath: Secondary | ICD-10-CM

## 2022-01-15 DIAGNOSIS — H00014 Hordeolum externum left upper eyelid: Secondary | ICD-10-CM | POA: Diagnosis not present

## 2022-01-15 MED ORDER — FUROSEMIDE 20 MG PO TABS: 20.0000 mg | ORAL_TABLET | Freq: Every day | ORAL | 0 refills | Status: DC

## 2022-01-15 MED ORDER — DOXYCYCLINE HYCLATE 100 MG PO TABS: 100.0000 mg | ORAL_TABLET | Freq: Two times a day (BID) | ORAL | 0 refills | Status: AC

## 2022-01-15 NOTE — Progress Notes (Signed)
Location:  [facility] Room Number: 116/A Place of Service:  SNF [phone]) Provider:  Yvonna Alanis, NP   Virgie Dad, MD  Patient Care Team: Virgie Dad, MD as PCP - General (Internal Medicine) Jerline Pain, MD as PCP - Cardiology (Cardiology) Deboraha Sprang, MD as PCP - Electrophysiology (Cardiology) Jovita Gamma, MD as Consulting Physician (Neurosurgery)  Extended Emergency Contact Information Primary Emergency Contact: Car,[name] Address: [place] of [place] Phone: [phone] Mobile Phone: [phone] Relation: Spouse Secondary Emergency Contact: Zipper,[name] Address: [address]          [place], [place] [address] [place] of [place] Mobile Phone: [phone] Relation: Son  Code Status:  Full code Goals of care: Advanced Directive information    11/27/2021    4:47 PM  Advanced Directives  Does Patient Have a Medical Advance Directive? No  Does patient want to make changes to medical advance directive? No - Patient declined     Chief Complaint  Patient presents with   Acute Visit    cough    HPI:  Pt is a 76 y.o. female seen today for acute visit due to shortness of breath.   Increased shortness of breath, wheezing and non productive cough x 1 day. O2 sats > 90% on room air. H/o multifocal pneumonia 09/2021. PNA resolved with doxycycline and cefpodoxime. 10/02 CXR revealed small right pleural effusion and focal consolidation to right lower lung. Nursing reports she has been sleeping more. Remains on stiolto and duonebs. Husband reports using flutter valve 2-3x/daily.   Right upper eye lid stye improved with erythromycin ointment.    Past Medical History:  Diagnosis Date   Anxiety    Arthritis    "some in my lower back; probably elbows, knees" (11/18/2017)   Atrial fibrillation (HCC)    Bell's palsy    when pt. was 76 yrs old, when under  stress the left side of face will droop.   Complication of anesthesia    "vascular OR 2016; BP bottomed out; couldn't get it regulated; ended up in ICU for DAYS" (11/18/2017)   GERD (gastroesophageal reflux disease)    History of kidney stones    Hypertension    Hypertrophic cardiomyopathy (HCC)    severe LV basilar hypertrophy witn no evidence of significant outflow tract obstruction, EF 65-70%, mild LAE, mild TR, grade 1a diastolic dysfunction [id number] (Dr. Candee Furbish) (Atrial Septal Hypertrophy pattern)-- Intra-op TEE with dsignificant outflow tract obstruction - AI, MR & TR   Insomnia    Mild aortic sclerosis    Osteopenia    Peripheral vascular disease (HCC)    Syncope    , Vagal   Past Surgical History:  Procedure Laterality Date   AUGMENTATION MAMMAPLASTY Bilateral    BACK SURGERY     CARDIAC CATHETERIZATION N/A 05/07/2016   Procedure: Left Heart Cath and Coronary Angiography;  Surgeon: Burnell Blanks, MD;  Location: [facility] CV LAB;  Service: Cardiovascular;  Laterality: N/A;   CARDIOVERSION N/A 09/24/2017   Procedure: CARDIOVERSION;  Surgeon: Larey Dresser, MD;  Location: [facility] ENDOSCOPY;  Service: Cardiovascular;  Laterality: N/A;   DILATION AND CURETTAGE OF UTERUS     ENDARTERECTOMY FEMORAL Right 03/02/2015   Procedure: ENDARTERECTOMY RIGHT FEMORAL;  Surgeon: Angelia Mould, MD;  Location: [facility];  Service: Vascular;  Laterality: Right;   ESOPHAGOGASTRODUODENOSCOPY (EGD) WITH PROPOFOL N/A 12/01/2020  Procedure: ESOPHAGOGASTRODUODENOSCOPY (EGD) WITH PROPOFOL;  Surgeon: Jesusita Oka, MD;  Location: [facility] ENDOSCOPY;  Service: General;  Laterality: N/A;   FACIAL COSMETIC SURGERY Left 2002   "related to [place] @ age 77; left eye/side of face droopy; tried to make area symmetrical"   FEMORAL-POPLITEAL BYPASS GRAFT Right 03/02/2015   Procedure: BYPASS GRAFT FEMORAL-BELOW KNEE POPLITEAL ARTERY;  Surgeon: Angelia Mould, MD;  Location: [facility];  Service:  Vascular;  Laterality: Right;   INGUINAL HERNIA REPAIR Bilateral 2002   IR ANGIO INTRA EXTRACRAN SEL COM CAROTID INNOMINATE UNI L MOD SED  11/16/2020   IR CT HEAD LTD  11/13/2020   IR PERCUTANEOUS ART THROMBECTOMY/INFUSION INTRACRANIAL INC DIAG ANGIO  11/13/2020   OVARIAN CYST REMOVAL Left    PEG PLACEMENT N/A 12/01/2020   Procedure: PERCUTANEOUS ENDOSCOPIC GASTROSTOMY (PEG) PLACEMENT;  Surgeon: Jesusita Oka, MD;  Location: [facility] ENDOSCOPY;  Service: General;  Laterality: N/A;   PERIPHERAL VASCULAR CATHETERIZATION N/A 01/16/2015   Procedure: Abdominal Aortogram;  Surgeon: Angelia Mould, MD;  Location: [facility] CV LAB;  Service: Cardiovascular;  Laterality: N/A;   POSTERIOR LUMBAR FUSION  2015   "have plates and screws in there"   [place] N/A 11/13/2020   Procedure: IR WITH ANESTHESIA;  Surgeon: Radiologist, Medication, MD;  Location: [facility];  Service: Radiology;  Laterality: N/A;   TONSILLECTOMY      Allergies  Allergen Reactions   Amoxicillin Other (See Comments)    UTI Has patient had a PCN reaction causing immediate rash, facial/tongue/throat swelling, SOB or lightheadedness with hypotension: No Has patient had a PCN reaction causing severe rash involving mucus membranes or skin necrosis: No Has patient had a PCN reaction that required hospitalization: No Has patient had a PCN reaction occurring within the last 10 years: Yes--UTI ONLY If all of the above answers are "NO", then may proceed with Cephalosporin use.    Atenolol Cough   Crestor [Rosuvastatin Calcium] Other (See Comments)    Muscle aches   Pravastatin Other (See Comments)    Muscle aches   Sulfa Antibiotics Nausea Only   Codeine Other (See Comments)    hallucinations    Outpatient Encounter Medications as of 01/15/2022  Medication Sig   acetaminophen (TYLENOL) 325 MG tablet Take 650 mg by mouth 2 (two) times daily as needed for mild pain.   aluminum-magnesium hydroxide-simethicone (MAALOX)  [id number] MG/5ML SUSP Take 30 mLs by mouth every 4 (four) hours as needed (heartburn).   amiodarone (PACERONE) 200 MG tablet Take 200 mg by mouth daily. May give crushed   amLODipine (NORVASC) 5 MG tablet Take 5 mg by mouth daily.   apixaban (ELIQUIS) 5 MG TABS tablet Take 5 mg by mouth 2 (two) times daily. May give crushed   bisacodyl (DULCOLAX) 5 MG EC tablet Take 5 mg by mouth 3 (three) times daily as needed for moderate constipation.   buPROPion ER (WELLBUTRIN SR) 100 MG 12 hr tablet Take 100 mg by mouth 2 (two) times daily.   diclofenac Sodium (VOLTAREN) 1 % GEL Apply one application to right hip and knee four times daily as needed.   ezetimibe (ZETIA) 10 MG tablet Take 10 mg by mouth daily. Oral if crushed   furosemide (LASIX) 20 MG tablet Take 20 mg by mouth daily as needed. Give Lasix 20mg  for weight gain equal or greater than 3 pounds   hydrALAZINE (APRESOLINE) 10 MG tablet Take 10 mg by mouth 3 (three) times daily as needed (SBP >  165 or DBP > 95).   ipratropium-albuterol (DUONEB) 0.5-2.5 (3) MG/3ML SOLN Inhale 3 mLs into the lungs every 6 (six) hours as needed.   ketoconazole (NIZORAL) 2 % cream Apply 1 application  topically daily as needed for irritation.   nystatin cream (MYCOSTATIN) Apply 1 application topically 2 (two) times daily as needed for dry skin (apply to perirectal rash).   ondansetron (ZOFRAN) 4 MG tablet Take 4 mg by mouth every 6 (six) hours as needed for nausea or vomiting.   OXYGEN Inhale 3 L into the lungs as directed. to maintain sats > or equal to 90%. May titrate Three Times A Day; 07:00 AM - 03:00 PM, 03:00 PM - 11:00 PM, 11:00 PM - 7:00 am   pantoprazole (PROTONIX) 40 MG tablet Take 40 mg by mouth 2 (two) times daily.   Polyethyl Glycol-Propyl Glycol (SYSTANE) 0.4-0.3 % SOLN Place 2 drops into both eyes 4 (four) times daily as needed (irritation).   polyethylene glycol powder (GLYCOLAX/MIRALAX) 17 GM/SCOOP powder Take 17 g by mouth every other day.   Tiotropium  Bromide-Olodaterol (STIOLTO RESPIMAT) 2.5-2.5 MCG/ACT AERS Inhale 2 puffs into the lungs daily.   zinc oxide 20 % ointment Apply 1 application. topically 2 (two) times daily as needed for irritation (apply to perirectal area prn - use with nystatin).   Facility-Administered Encounter Medications as of 01/15/2022  Medication   triamcinolone acetonide (KENALOG) 10 MG/ML injection 10 mg    Review of Systems  Constitutional:  Positive for fatigue. Negative for activity change, appetite change, chills and fever.  HENT:  Negative for congestion and sore throat.   Eyes:  Positive for redness. Negative for visual disturbance.  Respiratory:  Positive for cough, shortness of breath and wheezing.   Cardiovascular:  Negative for chest pain and leg swelling.  Gastrointestinal:  Negative for abdominal distention, abdominal pain and constipation.  Genitourinary:  Negative for dysuria, frequency and hematuria.  Musculoskeletal:  Positive for gait problem.  Skin:  Negative for wound.  Neurological:  Positive for weakness. Negative for dizziness.  Psychiatric/Behavioral:  Negative for confusion and dysphoric mood. The patient is not nervous/anxious.     Immunization History  Administered Date(s) Administered   Influenza Split 01/16/2009, 04/30/2010, 01/26/2016, 02/07/2018, 02/01/2020   Influenza, High Dose Seasonal PF 02/07/2018, 01/25/2019, 02/28/2021   Influenza,inj,Quad PF,6+ Mos 03/06/2012, 01/26/2016, 01/10/2017   Influenza,inj,quad, With Preservative 02/14/2015   Moderna Covid-19 Vaccine Bivalent Booster 18yrs & up 02/28/2021   PFIZER(Purple Top)SARS-COV-2 Vaccination 05/06/2019, 05/27/2019, 01/27/2020, 09/18/2020   Pneumococcal Conjugate-13 03/19/2017   Pneumococcal Polysaccharide-23 02/14/2015   Td 02/25/2005   Tdap 05/19/2013   Zoster, Live 07/28/2006   Pertinent  Health Maintenance Due  Topic Date Due   INFLUENZA VACCINE  11/13/2021   DEXA SCAN  Discontinued      07/07/2021    7:30  AM 07/07/2021    7:20 PM 07/08/2021    9:00 AM 07/08/2021    9:30 PM 07/09/2021    8:00 AM  Fall Risk  Patient Fall Risk Level High fall risk High fall risk High fall risk High fall risk Moderate fall risk   Functional Status Survey:    Vitals:   01/15/22 1404  BP: (!) 145/78  Pulse: 75  Resp: 17  Temp: 97.7 F (36.5 C)  SpO2: 94%  Weight: 99 lb 9.6 oz (45.2 kg)  Height: 5\' 3"  (1.6 m)   Body mass index is 17.64 kg/m. Physical Exam Vitals reviewed.  Constitutional:      General: She  is not in acute distress. HENT:     Head: Normocephalic.  Eyes:     General:        Right eye: No discharge.        Left eye: No discharge.     Comments: Redness to right upper lid, non tender  Cardiovascular:     Rate and Rhythm: Normal rate. Rhythm irregular.     Pulses: Normal pulses.     Heart sounds: Normal heart sounds.  Pulmonary:     Effort: Pulmonary effort is normal. No respiratory distress.     Breath sounds: Rales present. No wheezing.  Abdominal:     General: Bowel sounds are normal. There is no distension.     Palpations: Abdomen is soft.     Tenderness: There is no abdominal tenderness.  Musculoskeletal:     Cervical back: Neck supple.     Right lower leg: No edema.     Left lower leg: No edema.  Skin:    General: Skin is warm and dry.     Capillary Refill: Capillary refill takes less than 2 seconds.  Neurological:     General: No focal deficit present.     Mental Status: She is alert. Mental status is at baseline.     Motor: Weakness present.     Gait: Gait abnormal.  Psychiatric:        Mood and Affect: Mood normal.        Behavior: Behavior normal.     Labs reviewed: Recent Labs    01/23/21 0259 01/30/21 0000 07/06/21 1605 07/06/21 1612 07/07/21 0800 07/08/21 0717 07/09/21 0317 07/11/21 0000 09/18/21 0000 12/03/21 0000  NA 135   < > 138   < > 140 141 141 141 135* 139  K 3.6   < > 4.4   < > 4.3 4.0 3.8 4.1 5.1 4.4  CL 94*   < > 104   < > 107 105  109 103 101 104  CO2 28   < > 30  --  27 28 29  26* 27* 27*  GLUCOSE 85   < > 92   < > 80 87 90  --   --   --   BUN 14   < > 30*   < > 26* 22 21 24* 32* 28*  CREATININE 0.85   < > 0.98   < > 0.79 0.71 0.73 0.7 0.9 0.9  CALCIUM 8.7*   < > 8.8*  --  8.7* 9.0 8.7* 9.3 8.8 9.0  MG 2.0  --  2.1  --   --  1.9 1.9  --   --   --   PHOS 4.1  --   --   --   --   --   --   --   --   --    < > = values in this interval not displayed.   Recent Labs    01/22/21 0651 01/23/21 0259 03/26/21 0000 07/06/21 1605 12/03/21 0000 12/10/21 0000  AST 39 28   < > 33 38* 32  ALT 38 34   < > 45* 48* 38*  ALKPHOS 68 57   < > 76 122 122  BILITOT 1.3* 1.5*  --  0.7  --   --   PROT 6.1* 5.6*  --  6.2*  --   --   ALBUMIN 3.4* 3.1*   < > 3.2* 3.4* 3.5   < > = values in this interval not  displayed.   Recent Labs    01/22/21 0651 01/22/21 0658 02/02/21 0519 03/26/21 0000 07/06/21 1605 07/06/21 1612 07/08/21 0717 07/09/21 0317 07/11/21 0000  WBC 10.7*   < > 9.3   < > 9.3  --  9.8 9.5 8.3  NEUTROABS 7.7  --  7.2  --  6.5  --   --   --   --   HGB 15.5*   < > 14.4   < > 14.6   < > 14.9 14.3 14.1  HCT 48.4*   < > 45.3   < > 45.2   < > 45.4 43.7 42  MCV 96.4   < > 95.4  --  93.0  --  91.2 90.7  --   PLT 237   < > 286   < > 241  --  235 237 224   < > = values in this interval not displayed.   Lab Results  Component Value Date   TSH 0.632 07/06/2021   Lab Results  Component Value Date   HGBA1C 6.0 (H) 11/13/2020   Lab Results  Component Value Date   CHOL 182 11/22/2021   HDL 80 (A) 11/22/2021   LDLCALC 89 11/22/2021   TRIG 64 11/22/2021   CHOLHDL 3.0 11/13/2020    Significant Diagnostic Results in last 30 days:  No results found.  Assessment/Plan 1. Pleural effusion - 10/02 CXR noted small right pleural effusion- also noted CXR 06/26 - rales noted to middle lobes, non productive cough - furosemide (LASIX) 20 MG tablet; Take 1 tablet (20 mg total) by mouth daily for 2 days.  Dispense: 2  tablet; Refill: 0  2. Shortness of breath - 10/02 indicated small right pleural effusion and consolidation to right lower lobe - right lower lobe diminished today, reports feeling fatigued - H/o multifocal pneumonia 09/2021 - doxycycline (VIBRA-TABS) 100 MG tablet; Take 1 tablet (100 mg total) by mouth 2 (two) times daily for 7 days.  Dispense: 14 tablet; Refill: 0  3. Hordeolum externum of left upper eyelid - improved with erythromycin ointment - cont warm compresses    Family/ staff Communication: plan discussed with patient and nurse  Labs/tests ordered:  none

## 2022-01-16 DIAGNOSIS — R482 Apraxia: Secondary | ICD-10-CM | POA: Diagnosis not present

## 2022-01-16 DIAGNOSIS — I63312 Cerebral infarction due to thrombosis of left middle cerebral artery: Secondary | ICD-10-CM | POA: Diagnosis not present

## 2022-01-16 DIAGNOSIS — I69351 Hemiplegia and hemiparesis following cerebral infarction affecting right dominant side: Secondary | ICD-10-CM | POA: Diagnosis not present

## 2022-01-16 DIAGNOSIS — J9601 Acute respiratory failure with hypoxia: Secondary | ICD-10-CM | POA: Diagnosis not present

## 2022-01-16 DIAGNOSIS — R471 Dysarthria and anarthria: Secondary | ICD-10-CM | POA: Diagnosis not present

## 2022-01-16 DIAGNOSIS — I480 Paroxysmal atrial fibrillation: Secondary | ICD-10-CM | POA: Diagnosis not present

## 2022-01-16 DIAGNOSIS — R2689 Other abnormalities of gait and mobility: Secondary | ICD-10-CM | POA: Diagnosis not present

## 2022-01-16 DIAGNOSIS — I5032 Chronic diastolic (congestive) heart failure: Secondary | ICD-10-CM | POA: Diagnosis not present

## 2022-01-16 DIAGNOSIS — I6602 Occlusion and stenosis of left middle cerebral artery: Secondary | ICD-10-CM | POA: Diagnosis not present

## 2022-01-17 DIAGNOSIS — R278 Other lack of coordination: Secondary | ICD-10-CM | POA: Diagnosis not present

## 2022-01-17 DIAGNOSIS — I69351 Hemiplegia and hemiparesis following cerebral infarction affecting right dominant side: Secondary | ICD-10-CM | POA: Diagnosis not present

## 2022-01-17 DIAGNOSIS — I63312 Cerebral infarction due to thrombosis of left middle cerebral artery: Secondary | ICD-10-CM | POA: Diagnosis not present

## 2022-01-17 DIAGNOSIS — R001 Bradycardia, unspecified: Secondary | ICD-10-CM | POA: Diagnosis not present

## 2022-01-17 DIAGNOSIS — M6389 Disorders of muscle in diseases classified elsewhere, multiple sites: Secondary | ICD-10-CM | POA: Diagnosis not present

## 2022-01-21 ENCOUNTER — Non-Acute Institutional Stay (SKILLED_NURSING_FACILITY): Payer: Medicare Other | Admitting: Internal Medicine

## 2022-01-21 ENCOUNTER — Encounter: Payer: Self-pay | Admitting: Internal Medicine

## 2022-01-21 DIAGNOSIS — M62561 Muscle wasting and atrophy, not elsewhere classified, right lower leg: Secondary | ICD-10-CM | POA: Diagnosis not present

## 2022-01-21 DIAGNOSIS — F5101 Primary insomnia: Secondary | ICD-10-CM

## 2022-01-21 DIAGNOSIS — R2689 Other abnormalities of gait and mobility: Secondary | ICD-10-CM | POA: Diagnosis not present

## 2022-01-21 DIAGNOSIS — F331 Major depressive disorder, recurrent, moderate: Secondary | ICD-10-CM

## 2022-01-21 DIAGNOSIS — R278 Other lack of coordination: Secondary | ICD-10-CM | POA: Diagnosis not present

## 2022-01-21 DIAGNOSIS — I63312 Cerebral infarction due to thrombosis of left middle cerebral artery: Secondary | ICD-10-CM | POA: Diagnosis not present

## 2022-01-21 DIAGNOSIS — R918 Other nonspecific abnormal finding of lung field: Secondary | ICD-10-CM

## 2022-01-21 DIAGNOSIS — I69351 Hemiplegia and hemiparesis following cerebral infarction affecting right dominant side: Secondary | ICD-10-CM | POA: Diagnosis not present

## 2022-01-21 DIAGNOSIS — J9601 Acute respiratory failure with hypoxia: Secondary | ICD-10-CM | POA: Diagnosis not present

## 2022-01-21 DIAGNOSIS — I5032 Chronic diastolic (congestive) heart failure: Secondary | ICD-10-CM | POA: Diagnosis not present

## 2022-01-21 DIAGNOSIS — H00011 Hordeolum externum right upper eyelid: Secondary | ICD-10-CM

## 2022-01-21 MED ORDER — ZOLPIDEM TARTRATE 5 MG PO TABS: 2.5000 mg | ORAL_TABLET | Freq: Every evening | ORAL | 1 refills | Status: DC | PRN

## 2022-01-21 NOTE — Progress Notes (Unsigned)
Location:  [facility] Room Number: 116A Place of Service:  SNF [phone]) Provider:  Dr. Clydene Fake, MD  Patient Care Team: Virgie Dad, MD as PCP - General (Internal Medicine) Jerline Pain, MD as PCP - Cardiology (Cardiology) Deboraha Sprang, MD as PCP - Electrophysiology (Cardiology) Jovita Gamma, MD as Consulting Physician (Neurosurgery)  Extended Emergency Contact Information Primary Emergency Contact: Radell,[name] Address: [place] Phone: [phone] Mobile Phone: [phone] Relation: Spouse Secondary Emergency Contact: Goodloe,[name] Address: [address]          [place], [place] [address] [place] of [place] Mobile Phone: [phone] Relation: Son  Code Status:  Full Code Goals of care: Advanced Directive information    11/27/2021    4:47 PM  Advanced Directives  Does Patient Have a Medical Advance Directive? No  Does patient want to make changes to medical advance directive? No - Patient declined     Chief Complaint  Patient presents with   Acute Visit    Insomnia     HPI:  Pt is a 76 y.o. 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 was 76 yrs old, when under stress the left side of face will droop.   Complication of anesthesia    "vascular OR 2016; BP bottomed out; couldn't get it regulated; ended up in ICU for DAYS" (11/18/2017)   GERD (gastroesophageal reflux disease)    History of kidney stones    Hypertension    Hypertrophic cardiomyopathy (HCC)    severe LV basilar hypertrophy witn no evidence of significant outflow tract obstruction, EF 65-70%, mild LAE, mild TR, grade 1a diastolic dysfunction [id number] (Dr. Candee Furbish) (Atrial Septal Hypertrophy pattern)-- Intra-op TEE with dsignificant outflow tract obstruction - AI, MR & TR   Insomnia    Mild aortic sclerosis    Osteopenia    Peripheral vascular disease (HCC)    Syncope    , Vagal   Past Surgical History:  Procedure Laterality Date   AUGMENTATION MAMMAPLASTY Bilateral    BACK SURGERY     CARDIAC CATHETERIZATION N/A 05/07/2016   Procedure: Left Heart Cath and Coronary Angiography;  Surgeon: Burnell Blanks, MD;  Location: [facility];  Service: Cardiovascular;  Laterality: N/A;   CARDIOVERSION N/A 09/24/2017   Procedure: CARDIOVERSION;  Surgeon: Larey Dresser, MD;  Location: [facility];  Service: Cardiovascular;  Laterality: N/A;   DILATION  AND CURETTAGE OF UTERUS     ENDARTERECTOMY FEMORAL Right 03/02/2015   Procedure: ENDARTERECTOMY RIGHT FEMORAL;  Surgeon: Angelia Mould, MD;  Location: [facility];  Service: Vascular;  Laterality: Right;   ESOPHAGOGASTRODUODENOSCOPY (EGD) WITH PROPOFOL N/A 12/01/2020   Procedure: ESOPHAGOGASTRODUODENOSCOPY (EGD) WITH PROPOFOL;  Surgeon: Jesusita Oka, MD;  Location: [facility];  Service: General;  Laterality: N/A;   FACIAL COSMETIC SURGERY Left 2002   "related to [place] @ age 37; left eye/side of face droopy; tried to make area symmetrical"    FEMORAL-POPLITEAL BYPASS GRAFT Right 03/02/2015   Procedure: BYPASS GRAFT FEMORAL-BELOW KNEE POPLITEAL ARTERY;  Surgeon: Angelia Mould, MD;  Location: [facility];  Service: Vascular;  Laterality: Right;   INGUINAL HERNIA REPAIR Bilateral 2002   IR ANGIO INTRA EXTRACRAN SEL COM CAROTID INNOMINATE UNI L MOD SED  11/16/2020   IR CT HEAD LTD  11/13/2020   IR PERCUTANEOUS ART THROMBECTOMY/INFUSION INTRACRANIAL INC DIAG ANGIO  11/13/2020   OVARIAN CYST REMOVAL Left    PEG PLACEMENT N/A 12/01/2020   Procedure: PERCUTANEOUS ENDOSCOPIC GASTROSTOMY (PEG) PLACEMENT;  Surgeon: Jesusita Oka, MD;  Location: [facility];  Service: General;  Laterality: N/A;   PERIPHERAL VASCULAR CATHETERIZATION N/A 01/16/2015   Procedure: Abdominal Aortogram;  Surgeon: Angelia Mould, MD;  Location: [facility];  Service: Cardiovascular;  Laterality: N/A;   POSTERIOR LUMBAR FUSION  2015   "have plates and screws in there"   [place] N/A 11/13/2020   Procedure: IR WITH ANESTHESIA;  Surgeon: Radiologist, Medication, MD;  Location: [facility];  Service: Radiology;  Laterality: N/A;   TONSILLECTOMY      Allergies  Allergen Reactions   Amoxicillin Other (See Comments)    UTI Has patient had a PCN reaction causing immediate rash, facial/tongue/throat swelling, SOB or lightheadedness with hypotension: No Has patient had a PCN reaction causing severe rash involving mucus membranes or skin necrosis: No Has patient had a PCN reaction that required hospitalization: No Has patient had a PCN reaction occurring within the last 10 years: Yes--UTI ONLY If all of the above answers are "NO", then may proceed with Cephalosporin use.    Atenolol Cough   Crestor [Rosuvastatin Calcium] Other (See Comments)    Muscle aches   Pravastatin Other (See Comments)    Muscle aches   Rosuvastatin Calcium    Sulfa Antibiotics Nausea Only   Codeine Other (See Comments)    hallucinations    Outpatient Encounter  Medications as of 01/21/2022  Medication Sig   acetaminophen (TYLENOL) 325 MG tablet Take 325 mg by mouth 2 (two) times daily as needed for mild pain.   aluminum-magnesium hydroxide-simethicone (MAALOX) [id number] MG/5ML SUSP Take 30 mLs by mouth every 4 (four) hours as needed (heartburn).   amiodarone (PACERONE) 200 MG tablet Take 200 mg by mouth daily. May give crushed   amLODipine (NORVASC) 5 MG tablet Take 5 mg by mouth daily.   apixaban (ELIQUIS) 5 MG TABS tablet Take 5 mg by mouth 2 (two) times daily. May give crushed   bisacodyl (DULCOLAX) 5 MG EC tablet Take 5 mg by mouth 3 (three) times daily as needed for moderate constipation.   buPROPion ER (WELLBUTRIN SR) 100 MG 12 hr tablet Take 100 mg by mouth 2 (two) times daily.   diclofenac Sodium (VOLTAREN) 1 % GEL Apply one application to right hip and knee four times daily as needed.   doxycycline (VIBRA-TABS) 100 MG tablet Take 1 tablet (100 mg total) by  mouth 2 (two) times daily for 7 days.   ezetimibe (ZETIA) 10 MG tablet Take 10 mg by mouth daily. Oral if crushed   furosemide (LASIX) 20 MG tablet Take 20 mg by mouth daily as needed. Give Lasix 20mg  for weight gain equal or greater than 3 pounds   hydrALAZINE (APRESOLINE) 10 MG tablet Take 10 mg by mouth 3 (three) times daily as needed (SBP > 165 or DBP > 95).   ipratropium-albuterol (DUONEB) 0.5-2.5 (3) MG/3ML SOLN Inhale 3 mLs into the lungs every 6 (six) hours as needed.   ketoconazole (NIZORAL) 2 % cream Apply 1 application  topically daily as needed for irritation.   nystatin cream (MYCOSTATIN) Apply 1 application topically 2 (two) times daily as needed for dry skin (apply to perirectal rash).   ondansetron (ZOFRAN) 4 MG tablet Take 4 mg by mouth every 6 (six) hours as needed for nausea or vomiting.   OXYGEN Inhale 3 L into the lungs as directed. to maintain sats > or equal to 90%. May titrate Three Times A Day; 07:00 AM - 03:00 PM, 03:00 PM - 11:00 PM, 11:00 PM - 7:00 am    pantoprazole (PROTONIX) 40 MG tablet Take 40 mg by mouth 2 (two) times daily.   Polyethyl Glycol-Propyl Glycol (SYSTANE) 0.4-0.3 % SOLN Place 2 drops into both eyes 4 (four) times daily as needed (irritation).   polyethylene glycol powder (GLYCOLAX/MIRALAX) 17 GM/SCOOP powder Take 17 g by mouth every other day. (Patient taking differently: Take 17 g by mouth daily.)   promethazine (PHENERGAN) 12.5 MG suppository Place 12.5 mg rectally every 6 (six) hours as needed for nausea or vomiting.   Tiotropium Bromide-Olodaterol (STIOLTO RESPIMAT) 2.5-2.5 MCG/ACT AERS Inhale 2 puffs into the lungs daily.   zinc oxide 20 % ointment Apply 1 application. topically 2 (two) times daily as needed for irritation (apply to perirectal area prn - use with nystatin).   furosemide (LASIX) 20 MG tablet Take 1 tablet (20 mg total) by mouth daily for 2 days.   Facility-Administered Encounter Medications as of 01/21/2022  Medication   triamcinolone acetonide (KENALOG) 10 MG/ML injection 10 mg    Review of Systems  Constitutional:  Negative for activity change and appetite change.  HENT: Negative.    Respiratory:  Negative for cough and shortness of breath.   Cardiovascular:  Negative for leg swelling.  Gastrointestinal:  Negative for constipation.  Genitourinary: Negative.   Musculoskeletal:  Positive for gait problem. Negative for arthralgias and myalgias.  Skin: Negative.   Neurological:  Negative for dizziness and weakness.  Psychiatric/Behavioral:  Positive for sleep disturbance. Negative for confusion and dysphoric mood.     Immunization History  Administered Date(s) Administered   Influenza Split 01/16/2009, 04/30/2010, 01/26/2016, 02/07/2018, 02/01/2020   Influenza, High Dose Seasonal PF 02/07/2018, 01/25/2019, 02/28/2021   Influenza,inj,Quad PF,6+ Mos 03/06/2012, 01/26/2016, 01/10/2017   Influenza,inj,quad, With Preservative 02/14/2015   Moderna Covid-19 Vaccine Bivalent Booster 41yrs & up 02/28/2021    PFIZER(Purple Top)SARS-COV-2 Vaccination 05/06/2019, 05/27/2019, 01/27/2020, 09/18/2020   Pneumococcal Conjugate-13 03/19/2017   Pneumococcal Polysaccharide-23 02/14/2015   Td 02/25/2005   Tdap 05/19/2013   Zoster, Live 07/28/2006   Pertinent  Health Maintenance Due  Topic Date Due   INFLUENZA VACCINE  11/13/2021   DEXA SCAN  Discontinued      07/07/2021    7:30 AM 07/07/2021    7:20 PM 07/08/2021    9:00 AM 07/08/2021    9:30 PM 07/09/2021    8:00 AM  Fall Risk  Patient Fall Risk Level High fall risk High fall risk High fall risk High fall risk Moderate fall risk   Functional Status Survey:    Vitals:   01/21/22 0956  BP: (!) 147/79  Pulse: 63  Resp: 16  Temp: 97.6 F (36.4 C)  SpO2: 98%  Weight: 99 lb 9.6 oz (45.2 kg)  Height: 5\' 3"  (1.6 m)   Body mass index is 17.64 kg/m. Physical Exam Vitals reviewed.  Constitutional:      Appearance: Normal appearance.  HENT:     Head: Normocephalic.     Nose: Nose normal.     Mouth/Throat:     Mouth: Mucous membranes are moist.     Pharynx: Oropharynx is clear.  Eyes:     Pupils: Pupils are equal, round, and reactive to light.     Comments: Right eye stye  Cardiovascular:     Rate and Rhythm: Normal rate and regular rhythm.     Pulses: Normal pulses.     Heart sounds: Normal heart sounds. No murmur heard. Pulmonary:     Effort: Pulmonary effort is normal.     Breath sounds: Normal breath sounds.  Abdominal:     General: Abdomen is flat. Bowel sounds are normal.     Palpations: Abdomen is soft.  Musculoskeletal:        General: No swelling.     Cervical back: Neck supple.  Skin:    General: Skin is warm.  Neurological:     Mental Status: She is alert and oriented to person, place, and time.     Comments: Right Hemiparesis and APhasia  Psychiatric:        Mood and Affect: Mood normal.        Thought Content: Thought content normal.     Labs reviewed: Recent Labs    01/23/21 0259 01/30/21 0000  07/06/21 1605 07/06/21 1612 07/07/21 0800 07/08/21 0717 07/09/21 0317 07/11/21 0000 09/18/21 0000 12/03/21 0000  NA 135   < > 138   < > 140 141 141 141 135* 139  K 3.6   < > 4.4   < > 4.3 4.0 3.8 4.1 5.1 4.4  CL 94*   < > 104   < > 107 105 109 103 101 104  CO2 28   < > 30  --  27 28 29  26* 27* 27*  GLUCOSE 85   < > 92   < > 80 87 90  --   --   --   BUN 14   < > 30*   < > 26* 22 21 24* 32* 28*  CREATININE 0.85   < > 0.98   < > 0.79 0.71 0.73 0.7 0.9 0.9  CALCIUM 8.7*   < > 8.8*  --  8.7* 9.0 8.7* 9.3 8.8 9.0  MG 2.0  --  2.1  --   --  1.9 1.9  --   --   --   PHOS 4.1  --   --   --   --   --   --   --   --   --    < > = values in this interval not displayed.   Recent Labs    01/22/21 0651 01/23/21 0259 03/26/21 0000 07/06/21 1605 12/03/21 0000 12/10/21 0000  AST 39 28   < > 33 38* 32  ALT 38 34   < > 45* 48* 38*  ALKPHOS 68 57   < > 76 122 122  BILITOT 1.3* 1.5*  --  0.7  --   --   PROT 6.1* 5.6*  --  6.2*  --   --   ALBUMIN 3.4* 3.1*   < > 3.2* 3.4* 3.5   < > = values in this interval not displayed.   Recent Labs    01/22/21 0651 01/22/21 0658 02/02/21 0519 03/26/21 0000 07/06/21 1605 07/06/21 1612 07/08/21 0717 07/09/21 0317 07/11/21 0000  WBC 10.7*   < > 9.3   < > 9.3  --  9.8 9.5 8.3  NEUTROABS 7.7  --  7.2  --  6.5  --   --   --   --   HGB 15.5*   < > 14.4   < > 14.6   < > 14.9 14.3 14.1  HCT 48.4*   < > 45.3   < > 45.2   < > 45.4 43.7 42  MCV 96.4   < > 95.4  --  93.0  --  91.2 90.7  --   PLT 237   < > 286   < > 241  --  235 237 224   < > = values in this interval not displayed.   Lab Results  Component Value Date   TSH 0.632 07/06/2021   Lab Results  Component Value Date   HGBA1C 6.0 (H) 11/13/2020   Lab Results  Component Value Date   CHOL 182 11/22/2021   HDL 80 (A) 11/22/2021   LDLCALC 89 11/22/2021   TRIG 64 11/22/2021   CHOLHDL 3.0 11/13/2020    Significant Diagnostic Results in last 30 days:  No results found.  Assessment/Plan 1.  Primary insomnia Ambien 2.5 mg QHS According to patient she has failed everything else She was on 10 mg before but will start low right now   2. Moderate episode of recurrent major depressive disorder (HCC) Change Wellbutrin to XL to avoid Sleepdisturbace  3. Hordeolum externum of right upper eyelid Conitnue Eye compresses  4. Right lower lobe pulmonary infiltrate Doing better on Doxycylione Speech to eval for Dyapohagia    Family/ staff Communication: ***  Labs/tests ordered:  ***

## 2022-01-22 DIAGNOSIS — I480 Paroxysmal atrial fibrillation: Secondary | ICD-10-CM | POA: Diagnosis not present

## 2022-01-22 DIAGNOSIS — R293 Abnormal posture: Secondary | ICD-10-CM | POA: Diagnosis not present

## 2022-01-22 DIAGNOSIS — I69351 Hemiplegia and hemiparesis following cerebral infarction affecting right dominant side: Secondary | ICD-10-CM | POA: Diagnosis not present

## 2022-01-22 DIAGNOSIS — R278 Other lack of coordination: Secondary | ICD-10-CM | POA: Diagnosis not present

## 2022-01-22 DIAGNOSIS — I6602 Occlusion and stenosis of left middle cerebral artery: Secondary | ICD-10-CM | POA: Diagnosis not present

## 2022-01-22 DIAGNOSIS — M6389 Disorders of muscle in diseases classified elsewhere, multiple sites: Secondary | ICD-10-CM | POA: Diagnosis not present

## 2022-01-22 DIAGNOSIS — R001 Bradycardia, unspecified: Secondary | ICD-10-CM | POA: Diagnosis not present

## 2022-01-22 DIAGNOSIS — I63312 Cerebral infarction due to thrombosis of left middle cerebral artery: Secondary | ICD-10-CM | POA: Diagnosis not present

## 2022-01-22 DIAGNOSIS — R482 Apraxia: Secondary | ICD-10-CM | POA: Diagnosis not present

## 2022-01-22 DIAGNOSIS — R471 Dysarthria and anarthria: Secondary | ICD-10-CM | POA: Diagnosis not present

## 2022-01-23 DIAGNOSIS — R293 Abnormal posture: Secondary | ICD-10-CM | POA: Diagnosis not present

## 2022-01-23 DIAGNOSIS — R2689 Other abnormalities of gait and mobility: Secondary | ICD-10-CM | POA: Diagnosis not present

## 2022-01-23 DIAGNOSIS — R278 Other lack of coordination: Secondary | ICD-10-CM | POA: Diagnosis not present

## 2022-01-23 DIAGNOSIS — J9601 Acute respiratory failure with hypoxia: Secondary | ICD-10-CM | POA: Diagnosis not present

## 2022-01-23 DIAGNOSIS — I69351 Hemiplegia and hemiparesis following cerebral infarction affecting right dominant side: Secondary | ICD-10-CM | POA: Diagnosis not present

## 2022-01-23 DIAGNOSIS — I63312 Cerebral infarction due to thrombosis of left middle cerebral artery: Secondary | ICD-10-CM | POA: Diagnosis not present

## 2022-01-23 DIAGNOSIS — R001 Bradycardia, unspecified: Secondary | ICD-10-CM | POA: Diagnosis not present

## 2022-01-23 DIAGNOSIS — I5032 Chronic diastolic (congestive) heart failure: Secondary | ICD-10-CM | POA: Diagnosis not present

## 2022-01-23 DIAGNOSIS — M6389 Disorders of muscle in diseases classified elsewhere, multiple sites: Secondary | ICD-10-CM | POA: Diagnosis not present

## 2022-01-24 DIAGNOSIS — R278 Other lack of coordination: Secondary | ICD-10-CM | POA: Diagnosis not present

## 2022-01-24 DIAGNOSIS — R001 Bradycardia, unspecified: Secondary | ICD-10-CM | POA: Diagnosis not present

## 2022-01-24 DIAGNOSIS — M6389 Disorders of muscle in diseases classified elsewhere, multiple sites: Secondary | ICD-10-CM | POA: Diagnosis not present

## 2022-01-24 DIAGNOSIS — I69351 Hemiplegia and hemiparesis following cerebral infarction affecting right dominant side: Secondary | ICD-10-CM | POA: Diagnosis not present

## 2022-01-24 DIAGNOSIS — I63312 Cerebral infarction due to thrombosis of left middle cerebral artery: Secondary | ICD-10-CM | POA: Diagnosis not present

## 2022-01-25 ENCOUNTER — Non-Acute Institutional Stay (SKILLED_NURSING_FACILITY): Payer: Medicare Other | Admitting: Adult Health

## 2022-01-25 ENCOUNTER — Encounter: Payer: Self-pay | Admitting: Adult Health

## 2022-01-25 DIAGNOSIS — K8020 Calculus of gallbladder without cholecystitis without obstruction: Secondary | ICD-10-CM | POA: Diagnosis not present

## 2022-01-25 DIAGNOSIS — J9611 Chronic respiratory failure with hypoxia: Secondary | ICD-10-CM | POA: Diagnosis not present

## 2022-01-25 DIAGNOSIS — J9601 Acute respiratory failure with hypoxia: Secondary | ICD-10-CM | POA: Diagnosis not present

## 2022-01-25 DIAGNOSIS — I2510 Atherosclerotic heart disease of native coronary artery without angina pectoris: Secondary | ICD-10-CM | POA: Diagnosis not present

## 2022-01-25 DIAGNOSIS — I480 Paroxysmal atrial fibrillation: Secondary | ICD-10-CM | POA: Diagnosis not present

## 2022-01-25 DIAGNOSIS — R2689 Other abnormalities of gait and mobility: Secondary | ICD-10-CM | POA: Diagnosis not present

## 2022-01-25 DIAGNOSIS — J439 Emphysema, unspecified: Secondary | ICD-10-CM

## 2022-01-25 DIAGNOSIS — R918 Other nonspecific abnormal finding of lung field: Secondary | ICD-10-CM

## 2022-01-25 DIAGNOSIS — I5032 Chronic diastolic (congestive) heart failure: Secondary | ICD-10-CM | POA: Diagnosis not present

## 2022-01-25 DIAGNOSIS — I10 Essential (primary) hypertension: Secondary | ICD-10-CM

## 2022-01-25 DIAGNOSIS — E782 Mixed hyperlipidemia: Secondary | ICD-10-CM

## 2022-01-25 DIAGNOSIS — I69351 Hemiplegia and hemiparesis following cerebral infarction affecting right dominant side: Secondary | ICD-10-CM | POA: Diagnosis not present

## 2022-01-25 DIAGNOSIS — K5901 Slow transit constipation: Secondary | ICD-10-CM

## 2022-01-25 DIAGNOSIS — I63312 Cerebral infarction due to thrombosis of left middle cerebral artery: Secondary | ICD-10-CM | POA: Diagnosis not present

## 2022-01-25 MED ORDER — POLYETHYLENE GLYCOL 3350 17 GM/SCOOP PO POWD: 17.0000 g | Freq: Every day | ORAL | Status: DC

## 2022-01-25 NOTE — Progress Notes (Signed)
[facility]   Place of Service:  SNF (31) Provider:   [name] Beyonca Li, ANP [facility] [phone]   Beverly Li, Beverly L, MD  Patient Care Team: Beverly Li, Beverly L, MD as PCP - General (Internal Medicine) Beverly Li, Beverly C, MD as PCP - Cardiology (Cardiology) Beverly Li, Beverly C, MD as PCP - Electrophysiology (Cardiology) Beverly Li, Robert, MD as Consulting Physician (Neurosurgery)  Extended Emergency Contact Information Primary Emergency Contact: [name],[name] Address: [address]          [place] [phone] [place] Home Phone: [phone] Mobile Phone: [phone] Relation: Spouse Secondary Emergency Contact: [name],[name] Address: [address]          [place], [place] [id number] [place] Mobile Phone: [phone] Relation: Son  Code Status:  Full Goals of care: Advanced Directive information    11/27/2021    4:47 PM  Advanced Directives  Does Patient Have a Medical Advance Directive? No  Does patient want to make changes to medical advance directive? No - Patient declined     Chief Complaint  Patient presents with   Acute Visit    F/u pna  also medical management of chronic diseases.     HPI:  Pt is a 76 y.o. female seen today for medical management of chronic diseases.    She was treated for a right lower lobe infiltrate and pulmonary edema, small right effusion with lasix for two days and doxycycline on 01/15/22. Reports she has a slight cough, no sputum production, no fever. Continues with periodic sob on exertion when walking with therapy but per her husband it is not consistent. Weight is down 2 lbs Of note she was treated for multifocal pna and followed up with pulmonary 12/07/21 and at that time there was an opacity in the right lower lobe likely related to her breast implant.   Reports she does not have abd pain. Did have a bout of nausea on 10/10 and took zofran with resolution  of symptoms. Has gallstones noted on US 11/30/21 LFTs trending downward.     Bowels are moving well with miralax.   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 No outward sign of aspiration at his time. Also has right carotid stenosis seen by neurology [name] who ordered a f/u carotid ultrasound with no worsening of disease.   Prior fem/pop due to PAD, no further symptoms  Having some insomnia, using prn low dose ambien with some efficacy  CHF: has grade II diastolic dysfunction with low normal EF 60-65%, mild to moderate aortic valve sclerosis, on echo 11/21/21.  She has prn lasix but frequently weights are refused. This has not been used recently. Weight down 2 lbs.   Past Medical History:  Diagnosis Date   Anxiety    Arthritis    "some in my lower back; probably elbows, knees" (11/18/2017)   Atrial fibrillation (HCC)    Bell's palsy    when pt. was 76 yrs old, when under stress the left side of face will droop.   Complication of anesthesia    "vascular OR 2016; BP bottomed out; couldn't get it regulated; ended up in ICU for DAYS" (11/18/2017)   GERD (gastroesophageal reflux disease)    History of kidney stones  Hypertension    Hypertrophic cardiomyopathy (HCC)    severe LV basilar hypertrophy witn no evidence of significant outflow tract obstruction, EF 65-70%, mild LAE, mild TR, grade 1a diastolic dysfunction [id number] (Dr. Candee Furbish) (Atrial Septal Hypertrophy pattern)-- Intra-op TEE with dsignificant outflow tract obstruction - AI, MR & TR   Insomnia    Mild aortic sclerosis    Osteopenia    Peripheral vascular disease (HCC)    Syncope    , Vagal   Past Surgical History:  Procedure Laterality Date   AUGMENTATION MAMMAPLASTY Bilateral    BACK SURGERY     CARDIAC  CATHETERIZATION N/A 05/07/2016   Procedure: Left Heart Cath and Coronary Angiography;  Surgeon: Burnell Blanks, MD;  Location: [facility];  Service: Cardiovascular;  Laterality: N/A;   CARDIOVERSION N/A 09/24/2017   Procedure: CARDIOVERSION;  Surgeon: Larey Dresser, MD;  Location: [facility] ENDOSCOPY;  Service: Cardiovascular;  Laterality: N/A;   DILATION AND CURETTAGE OF UTERUS     ENDARTERECTOMY FEMORAL Right 03/02/2015   Procedure: ENDARTERECTOMY RIGHT FEMORAL;  Surgeon: Angelia Mould, MD;  Location: [facility];  Service: Vascular;  Laterality: Right;   ESOPHAGOGASTRODUODENOSCOPY (EGD) WITH PROPOFOL N/A 12/01/2020   Procedure: ESOPHAGOGASTRODUODENOSCOPY (EGD) WITH PROPOFOL;  Surgeon: Jesusita Oka, MD;  Location: [facility] ENDOSCOPY;  Service: General;  Laterality: N/A;   FACIAL COSMETIC SURGERY Left 2002   "related to [place] @ age 74; left eye/side of face droopy; tried to make area symmetrical"   FEMORAL-POPLITEAL BYPASS GRAFT Right 03/02/2015   Procedure: BYPASS GRAFT FEMORAL-BELOW KNEE POPLITEAL ARTERY;  Surgeon: Angelia Mould, MD;  Location: [facility];  Service: Vascular;  Laterality: Right;   INGUINAL HERNIA REPAIR Bilateral 2002   IR ANGIO INTRA EXTRACRAN SEL COM CAROTID INNOMINATE UNI Li MOD SED  11/16/2020   IR CT HEAD LTD  11/13/2020   IR PERCUTANEOUS ART THROMBECTOMY/INFUSION INTRACRANIAL INC DIAG ANGIO  11/13/2020   OVARIAN CYST REMOVAL Left    PEG PLACEMENT N/A 12/01/2020   Procedure: PERCUTANEOUS ENDOSCOPIC GASTROSTOMY (PEG) PLACEMENT;  Surgeon: Jesusita Oka, MD;  Location: [facility];  Service: General;  Laterality: N/A;   PERIPHERAL VASCULAR CATHETERIZATION N/A 01/16/2015   Procedure: Abdominal Aortogram;  Surgeon: Angelia Mould, MD;  Location: [facility];  Service: Cardiovascular;  Laterality: N/A;   POSTERIOR LUMBAR FUSION  2015   "have plates and screws in there"   [place] N/A 11/13/2020   Procedure: IR WITH ANESTHESIA;   Surgeon: Radiologist, Medication, MD;  Location: [facility];  Service: Radiology;  Laterality: N/A;   TONSILLECTOMY      Allergies  Allergen Reactions   Amoxicillin Other (See Comments)    UTI Has patient had a PCN reaction causing immediate rash, facial/tongue/throat swelling, SOB or lightheadedness with hypotension: No Has patient had a PCN reaction causing severe rash involving mucus membranes or skin necrosis: No Has patient had a PCN reaction that required hospitalization: No Has patient had a PCN reaction occurring within the last 10 years: Yes--UTI ONLY If all of the above answers are "NO", then may proceed with Cephalosporin use.    Atenolol Cough   Crestor [Rosuvastatin Calcium] Other (See Comments)    Muscle aches   Pravastatin Other (See Comments)    Muscle aches   Rosuvastatin Calcium    Sulfa Antibiotics Nausea Only   Codeine Other (See Comments)    hallucinations    Outpatient Encounter Medications as of 01/25/2022  Medication Sig   acetaminophen (TYLENOL)  325 MG tablet Take 325 mg by mouth 2 (two) times daily as needed for mild pain.   aluminum-magnesium hydroxide-simethicone (MAALOX) [id number] MG/5ML SUSP Take 30 mLs by mouth every 4 (four) hours as needed (heartburn).   amiodarone (PACERONE) 200 MG tablet Take 200 mg by mouth daily. May give crushed   amLODipine (NORVASC) 5 MG tablet Take 5 mg by mouth daily.   apixaban (ELIQUIS) 5 MG TABS tablet Take 5 mg by mouth 2 (two) times daily. May give crushed   bisacodyl (DULCOLAX) 5 MG EC tablet Take 5 mg by mouth 3 (three) times daily as needed for moderate constipation.   buPROPion (WELLBUTRIN XL) 150 MG 24 hr tablet Take 150 mg by mouth daily.   diclofenac Sodium (VOLTAREN) 1 % GEL Apply one application to right hip and knee four times daily as needed.   ezetimibe (ZETIA) 10 MG tablet Take 10 mg by mouth daily. Oral if crushed   furosemide (LASIX) 20 MG tablet Take 20 mg by mouth daily as needed. Give Lasix 20mg  for  weight gain equal or greater than 3 pounds   hydrALAZINE (APRESOLINE) 10 MG tablet Take 10 mg by mouth 3 (three) times daily as needed (SBP > 165 or DBP > 95).   ipratropium-albuterol (DUONEB) 0.5-2.5 (3) MG/3ML SOLN Inhale 3 mLs into the lungs every 6 (six) hours as needed.   ketoconazole (NIZORAL) 2 % cream Apply 1 application  topically daily as needed for irritation.   nystatin cream (MYCOSTATIN) Apply 1 application topically 2 (two) times daily as needed for dry skin (apply to perirectal rash).   ondansetron (ZOFRAN) 4 MG tablet Take 4 mg by mouth every 6 (six) hours as needed for nausea or vomiting.   OXYGEN Inhale 3 Li into the lungs as directed. to maintain sats > or equal to 90%. May titrate Three Times A Day; 07:00 AM - 03:00 PM, 03:00 PM - 11:00 PM, 11:00 PM - 7:00 am   pantoprazole (PROTONIX) 40 MG tablet Take 40 mg by mouth 2 (two) times daily.   Polyethyl Glycol-Propyl Glycol (SYSTANE) 0.4-0.3 % SOLN Place 2 drops into both eyes 4 (four) times daily as needed (irritation).   polyethylene glycol powder (GLYCOLAX/MIRALAX) 17 GM/SCOOP powder Take 17 g by mouth daily.   promethazine (PHENERGAN) 12.5 MG suppository Place 12.5 mg rectally every 6 (six) hours as needed for nausea or vomiting.   Tiotropium Bromide-Olodaterol (STIOLTO RESPIMAT) 2.5-2.5 MCG/ACT AERS Inhale 2 puffs into the lungs daily.   zinc oxide 20 % ointment Apply 1 application. topically 2 (two) times daily as needed for irritation (apply to perirectal area prn - use with nystatin).   zolpidem (AMBIEN) 5 MG tablet Take 0.5 tablets (2.5 mg total) by mouth at bedtime as needed for sleep.   [DISCONTINUED] furosemide (LASIX) 20 MG tablet Take 1 tablet (20 mg total) by mouth daily for 2 days.   [DISCONTINUED] polyethylene glycol powder (GLYCOLAX/MIRALAX) 17 GM/SCOOP powder Take 17 g by mouth every other day. (Patient taking differently: Take 17 g by mouth daily.)   Facility-Administered Encounter Medications as of 01/25/2022   Medication   triamcinolone acetonide (KENALOG) 10 MG/ML injection 10 mg    Review of Systems  Constitutional:  Negative for activity change, appetite change, chills, diaphoresis, fatigue, fever and unexpected weight change.  HENT:  Negative for congestion.   Respiratory:  Positive for cough (no sputum). Negative for shortness of breath and wheezing.   Cardiovascular:  Negative for chest pain, palpitations and leg swelling.  Gastrointestinal:  Positive for nausea (no frequent). Negative for abdominal distention, abdominal pain, constipation and diarrhea.  Genitourinary:  Negative for difficulty urinating and dysuria.  Musculoskeletal:  Positive for gait problem. Negative for arthralgias, back pain, joint swelling and myalgias.  Neurological:  Positive for facial asymmetry, speech difficulty and weakness. Negative for dizziness, tremors, seizures, syncope, light-headedness, numbness and headaches.  Psychiatric/Behavioral:  Positive for sleep disturbance. Negative for agitation, behavioral problems and confusion.     Immunization History  Administered Date(s) Administered   Influenza Split 01/16/2009, 04/30/2010, 01/26/2016, 02/07/2018, 02/01/2020   Influenza, High Dose Seasonal PF 02/07/2018, 01/25/2019, 02/28/2021   Influenza,inj,Quad PF,6+ Mos 03/06/2012, 01/26/2016, 01/10/2017   Influenza,inj,quad, With Preservative 02/14/2015   Moderna Covid-19 Vaccine Bivalent Booster 2yrs & up 02/28/2021   PFIZER(Purple Top)SARS-COV-2 Vaccination 05/06/2019, 05/27/2019, 01/27/2020, 09/18/2020   Pneumococcal Conjugate-13 03/19/2017   Pneumococcal Polysaccharide-23 02/14/2015   Td 02/25/2005   Tdap 05/19/2013   Zoster, Live 07/28/2006   Pertinent  Health Maintenance Due  Topic Date Due   INFLUENZA VACCINE  11/13/2021   DEXA SCAN  Discontinued      07/07/2021    7:30 AM 07/07/2021    7:20 PM 07/08/2021    9:00 AM 07/08/2021    9:30 PM 07/09/2021    8:00 AM  Fall Risk  Patient Fall Risk Level  High fall risk High fall risk High fall risk High fall risk Moderate fall risk   Functional Status Survey:    Vitals:   01/25/22 1150  BP: 111/65  Pulse: (!) 52  Resp: 16  Temp: (!) 97.2 F (36.2 Li)  SpO2: 95%  Weight: 97 lb 8 oz (44.2 kg)   Body mass index is 17.27 kg/m. Physical Exam Vitals and nursing note reviewed.  Constitutional:      General: She is not in acute distress.    Appearance: She is not diaphoretic.  HENT:     Head: Normocephalic and atraumatic.  Neck:     Vascular: No JVD.  Cardiovascular:     Rate and Rhythm: Normal rate and regular rhythm.     Heart sounds: No murmur heard. Pulmonary:     Effort: Pulmonary effort is normal. No respiratory distress.     Breath sounds: Rales (right base) present. No wheezing.  Abdominal:     General: Bowel sounds are normal. There is no distension.     Palpations: Abdomen is soft.     Tenderness: There is no abdominal tenderness.  Musculoskeletal:     Right lower leg: No edema.     Left lower leg: No edema.  Skin:    General: Skin is warm and dry.  Neurological:     Mental Status: She is alert and oriented to person, place, and time.     Labs reviewed: Recent Labs    07/06/21 1605 07/06/21 1612 07/07/21 0800 07/08/21 0717 07/09/21 0317 07/11/21 0000 09/18/21 0000 12/03/21 0000  NA 138   < > 140 141 141 141 135* 139  K 4.4   < > 4.3 4.0 3.8 4.1 5.1 4.4  CL 104   < > 107 105 109 103 101 104  CO2 30  --  27 28 29  26* 27* 27*  GLUCOSE 92   < > 80 87 90  --   --   --   BUN 30*   < > 26* 22 21 24* 32* 28*  CREATININE 0.98   < > 0.79 0.71 0.73 0.7 0.9 0.9  CALCIUM 8.8*  --  8.7* 9.0 8.7* 9.3 8.8 9.0  MG 2.1  --   --  1.9 1.9  --   --   --    < > = values in this interval not displayed.   Recent Labs    07/06/21 1605 12/03/21 0000 12/10/21 0000  AST 33 38* 32  ALT 45* 48* 38*  ALKPHOS 76 122 122  BILITOT 0.7  --   --   PROT 6.2*  --   --   ALBUMIN 3.2* 3.4* 3.5   Recent Labs    02/02/21 0519  03/26/21 0000 07/06/21 1605 07/06/21 1612 07/08/21 0717 07/09/21 0317 07/11/21 0000  WBC 9.3   < > 9.3  --  9.8 9.5 8.3  NEUTROABS 7.2  --  6.5  --   --   --   --   HGB 14.4   < > 14.6   < > 14.9 14.3 14.1  HCT 45.3   < > 45.2   < > 45.4 43.7 42  MCV 95.4  --  93.0  --  91.2 90.7  --   PLT 286   < > 241  --  235 237 224   < > = values in this interval not displayed.   Lab Results  Component Value Date   TSH 0.632 07/06/2021   Lab Results  Component Value Date   HGBA1C 6.0 (H) 11/13/2020   Lab Results  Component Value Date   CHOL 182 11/22/2021   HDL 80 (A) 11/22/2021   LDLCALC 89 11/22/2021   TRIG 64 11/22/2021   CHOLHDL 3.0 11/13/2020    Significant Diagnostic Results in last 30 days:  No results found.  Assessment/Plan  1. PAF (paroxysmal atrial fibrillation) (HCC) Rate controlled, sometimes in the 50s on amiodarone Continue eliquis per cardiology for CVA risk reduction   2. Essential hypertension Controlled  3. Coronary artery disease involving native coronary artery of native heart without angina pectoris On eliquis and zetia, not tolerant of statin   4. Pulmonary emphysema, unspecified emphysema type ([place]) Followed by pulmonary on stiolto Continue duonebs as needed  5. Chronic respiratory failure with hypoxia (HCC) On 3 liters of oxygen   6. Mixed hyperlipidemia Lab Results  Component Value Date   LDLCALC 89 11/22/2021  See above.   7. Gallstones Feels she has improved, LFTs trended down, not interested in GI at this time Discussed that if she developed fever, pain, vomiting, would need urgent eval   8. Slow transit constipation Improved.  - polyethylene glycol powder (GLYCOLAX/MIRALAX) 17 GM/SCOOP powder; Take 17 g by mouth daily.  9. Chronic diastolic congestive heart failure (HCC) Hx of hypertrophic obstructive cardiomyopathy  Weight is down 2 lbs Keep lasix as needed but if symptoms worsen could add scheduled dose.   10. Pulmonary  infiltrate Not sure this is a true case of pna due to prior findings on xray in August.  Seems to be close to baseline with continued rales in right base, did complete doxycycline She will f/u with pulmonary and I believe they will be doing an xray there.    Family/ staff Communication: resident and her husband   Labs/tests ordered:  NA

## 2022-01-28 ENCOUNTER — Telehealth: Payer: Self-pay | Admitting: Adult Health

## 2022-01-28 DIAGNOSIS — M62561 Muscle wasting and atrophy, not elsewhere classified, right lower leg: Secondary | ICD-10-CM | POA: Diagnosis not present

## 2022-01-28 DIAGNOSIS — R278 Other lack of coordination: Secondary | ICD-10-CM | POA: Diagnosis not present

## 2022-01-28 DIAGNOSIS — R2689 Other abnormalities of gait and mobility: Secondary | ICD-10-CM | POA: Diagnosis not present

## 2022-01-28 DIAGNOSIS — M6389 Disorders of muscle in diseases classified elsewhere, multiple sites: Secondary | ICD-10-CM | POA: Diagnosis not present

## 2022-01-28 DIAGNOSIS — R001 Bradycardia, unspecified: Secondary | ICD-10-CM | POA: Diagnosis not present

## 2022-01-28 DIAGNOSIS — J9601 Acute respiratory failure with hypoxia: Secondary | ICD-10-CM | POA: Diagnosis not present

## 2022-01-28 DIAGNOSIS — I5032 Chronic diastolic (congestive) heart failure: Secondary | ICD-10-CM | POA: Diagnosis not present

## 2022-01-28 DIAGNOSIS — I63312 Cerebral infarction due to thrombosis of left middle cerebral artery: Secondary | ICD-10-CM | POA: Diagnosis not present

## 2022-01-28 DIAGNOSIS — R293 Abnormal posture: Secondary | ICD-10-CM | POA: Diagnosis not present

## 2022-01-28 DIAGNOSIS — I69351 Hemiplegia and hemiparesis following cerebral infarction affecting right dominant side: Secondary | ICD-10-CM | POA: Diagnosis not present

## 2022-01-28 NOTE — Telephone Encounter (Signed)
Called RN, advised her of NP's message, advice. She verbalized understanding, appreciation.

## 2022-01-28 NOTE — Telephone Encounter (Signed)
Misty, RN at [facility] is asking for a consult to discuss Botox for pt's right arm.  Mist can be reached at [phone]

## 2022-01-28 NOTE — Telephone Encounter (Signed)
[facility] RN and asked for specifics about patient's arm. She stated the therapist at [facility] suggested that Botox may help with movement of her arm. I advised will send to NP, let her know Misty verbalized understanding, appreciation.

## 2022-01-28 NOTE — Telephone Encounter (Signed)
I personally do not do Botox. She can be referred to Dr. Krista Blue or Dr. Letta Pate for this evaluation by [facility] provider if they are wanting this evaluation. Please cancel scheduled visit.

## 2022-01-29 DIAGNOSIS — I69351 Hemiplegia and hemiparesis following cerebral infarction affecting right dominant side: Secondary | ICD-10-CM | POA: Diagnosis not present

## 2022-01-29 DIAGNOSIS — I63312 Cerebral infarction due to thrombosis of left middle cerebral artery: Secondary | ICD-10-CM | POA: Diagnosis not present

## 2022-01-29 DIAGNOSIS — R482 Apraxia: Secondary | ICD-10-CM | POA: Diagnosis not present

## 2022-01-29 DIAGNOSIS — R471 Dysarthria and anarthria: Secondary | ICD-10-CM | POA: Diagnosis not present

## 2022-01-29 DIAGNOSIS — M6389 Disorders of muscle in diseases classified elsewhere, multiple sites: Secondary | ICD-10-CM | POA: Diagnosis not present

## 2022-01-29 DIAGNOSIS — I6602 Occlusion and stenosis of left middle cerebral artery: Secondary | ICD-10-CM | POA: Diagnosis not present

## 2022-01-29 DIAGNOSIS — R001 Bradycardia, unspecified: Secondary | ICD-10-CM | POA: Diagnosis not present

## 2022-01-29 DIAGNOSIS — I480 Paroxysmal atrial fibrillation: Secondary | ICD-10-CM | POA: Diagnosis not present

## 2022-01-29 DIAGNOSIS — R278 Other lack of coordination: Secondary | ICD-10-CM | POA: Diagnosis not present

## 2022-01-30 DIAGNOSIS — R2689 Other abnormalities of gait and mobility: Secondary | ICD-10-CM | POA: Diagnosis not present

## 2022-01-30 DIAGNOSIS — J9601 Acute respiratory failure with hypoxia: Secondary | ICD-10-CM | POA: Diagnosis not present

## 2022-01-30 DIAGNOSIS — I63312 Cerebral infarction due to thrombosis of left middle cerebral artery: Secondary | ICD-10-CM | POA: Diagnosis not present

## 2022-01-30 DIAGNOSIS — I69351 Hemiplegia and hemiparesis following cerebral infarction affecting right dominant side: Secondary | ICD-10-CM | POA: Diagnosis not present

## 2022-01-30 DIAGNOSIS — I5032 Chronic diastolic (congestive) heart failure: Secondary | ICD-10-CM | POA: Diagnosis not present

## 2022-01-31 DIAGNOSIS — I69351 Hemiplegia and hemiparesis following cerebral infarction affecting right dominant side: Secondary | ICD-10-CM | POA: Diagnosis not present

## 2022-01-31 DIAGNOSIS — R278 Other lack of coordination: Secondary | ICD-10-CM | POA: Diagnosis not present

## 2022-01-31 DIAGNOSIS — M6389 Disorders of muscle in diseases classified elsewhere, multiple sites: Secondary | ICD-10-CM | POA: Diagnosis not present

## 2022-01-31 DIAGNOSIS — R001 Bradycardia, unspecified: Secondary | ICD-10-CM | POA: Diagnosis not present

## 2022-01-31 DIAGNOSIS — I63312 Cerebral infarction due to thrombosis of left middle cerebral artery: Secondary | ICD-10-CM | POA: Diagnosis not present

## 2022-02-01 DIAGNOSIS — I69351 Hemiplegia and hemiparesis following cerebral infarction affecting right dominant side: Secondary | ICD-10-CM | POA: Diagnosis not present

## 2022-02-01 DIAGNOSIS — R001 Bradycardia, unspecified: Secondary | ICD-10-CM | POA: Diagnosis not present

## 2022-02-01 DIAGNOSIS — R278 Other lack of coordination: Secondary | ICD-10-CM | POA: Diagnosis not present

## 2022-02-01 DIAGNOSIS — M6389 Disorders of muscle in diseases classified elsewhere, multiple sites: Secondary | ICD-10-CM | POA: Diagnosis not present

## 2022-02-01 DIAGNOSIS — I5032 Chronic diastolic (congestive) heart failure: Secondary | ICD-10-CM | POA: Diagnosis not present

## 2022-02-01 DIAGNOSIS — J9601 Acute respiratory failure with hypoxia: Secondary | ICD-10-CM | POA: Diagnosis not present

## 2022-02-01 DIAGNOSIS — R2689 Other abnormalities of gait and mobility: Secondary | ICD-10-CM | POA: Diagnosis not present

## 2022-02-01 DIAGNOSIS — I63312 Cerebral infarction due to thrombosis of left middle cerebral artery: Secondary | ICD-10-CM | POA: Diagnosis not present

## 2022-02-04 DIAGNOSIS — J9601 Acute respiratory failure with hypoxia: Secondary | ICD-10-CM | POA: Diagnosis not present

## 2022-02-04 DIAGNOSIS — I63312 Cerebral infarction due to thrombosis of left middle cerebral artery: Secondary | ICD-10-CM | POA: Diagnosis not present

## 2022-02-04 DIAGNOSIS — M62561 Muscle wasting and atrophy, not elsewhere classified, right lower leg: Secondary | ICD-10-CM | POA: Diagnosis not present

## 2022-02-04 DIAGNOSIS — I69351 Hemiplegia and hemiparesis following cerebral infarction affecting right dominant side: Secondary | ICD-10-CM | POA: Diagnosis not present

## 2022-02-04 DIAGNOSIS — R2689 Other abnormalities of gait and mobility: Secondary | ICD-10-CM | POA: Diagnosis not present

## 2022-02-04 DIAGNOSIS — I5032 Chronic diastolic (congestive) heart failure: Secondary | ICD-10-CM | POA: Diagnosis not present

## 2022-02-04 DIAGNOSIS — R278 Other lack of coordination: Secondary | ICD-10-CM | POA: Diagnosis not present

## 2022-02-05 ENCOUNTER — Encounter: Payer: Self-pay | Admitting: Emergency Medicine

## 2022-02-05 ENCOUNTER — Ambulatory Visit: Payer: Medicare Other | Admitting: Emergency Medicine

## 2022-02-05 VITALS — BP 132/80 | HR 65 | Temp 97.8°F | Ht 64.0 in

## 2022-02-05 DIAGNOSIS — R0902 Hypoxemia: Secondary | ICD-10-CM | POA: Diagnosis not present

## 2022-02-05 DIAGNOSIS — R293 Abnormal posture: Secondary | ICD-10-CM | POA: Diagnosis not present

## 2022-02-05 DIAGNOSIS — I63312 Cerebral infarction due to thrombosis of left middle cerebral artery: Secondary | ICD-10-CM | POA: Diagnosis not present

## 2022-02-05 DIAGNOSIS — Z23 Encounter for immunization: Secondary | ICD-10-CM | POA: Diagnosis not present

## 2022-02-05 DIAGNOSIS — M6389 Disorders of muscle in diseases classified elsewhere, multiple sites: Secondary | ICD-10-CM | POA: Diagnosis not present

## 2022-02-05 DIAGNOSIS — I69351 Hemiplegia and hemiparesis following cerebral infarction affecting right dominant side: Secondary | ICD-10-CM | POA: Diagnosis not present

## 2022-02-05 DIAGNOSIS — R278 Other lack of coordination: Secondary | ICD-10-CM | POA: Diagnosis not present

## 2022-02-05 DIAGNOSIS — J439 Emphysema, unspecified: Secondary | ICD-10-CM

## 2022-02-05 DIAGNOSIS — R001 Bradycardia, unspecified: Secondary | ICD-10-CM | POA: Diagnosis not present

## 2022-02-05 NOTE — Patient Instructions (Addendum)
Please continue Stiolto 2 puffs once a day You can continue to use DuoNeb up to every 6 hours if needed for shortness of breath, chest tightness, wheezing. Continue oxygen as you have been using it Flu shot today You would benefit from getting the COVID-19 vaccine and the RSV vaccine this fall Follow with Dr Lamonte Sakai in 6 months or sooner if you have any problems

## 2022-02-05 NOTE — Assessment & Plan Note (Signed)
Debilitated but multifactorial, not necessarily all due to daily respiratory status.  She has been seriously impacted by pneumonias.  Question whether she may have aspiration although she is passed swallowing evaluations and is not on modified diet currently.  Discussed precautions with them today.  Plan to continue her Stiolto, DuoNeb as needed

## 2022-02-05 NOTE — Assessment & Plan Note (Signed)
Continue supplemental oxygen as ordered

## 2022-02-05 NOTE — Progress Notes (Signed)
Subjective:    Patient ID: Beverly Li, female    DOB: [birth year], 76 y.o.   MRN: [id number]  HPI  ROV 03/15/2019 --76 year old former smoker with A. fib, GERD, PVD, HOCM.  She reestablished care in September to evaluate dyspnea.  She has cough, throat irritation, hears occasional wheezing.  I started her empirically on Stiolto at that time to see if she would get benefit.  She returns today to review.  Pulmonary function testing done today was reviewed, shows very severe obstruction without a significant bronchodilator response, hyperinflated volumes and decreased diffusion capacity.  She reports that she took the [place] for about 5 days, then she stopped it after she felt better.  She doesn't feel as SOB currently as she did when we met in September. Minimal cough. No wheeze. No desat on walk last time. Flu shot up to date. She believes that she may have had PNA shot before w Dr Radene Ou - she will check.   ROV 02/05/22 --76 year old woman with a history of atrial fibrillation, GERD, PVD, hypertrophic obstructive cardiomyopathy.  She has been seen in our office for severe obstructive lung disease consistent with COPD.  I last saw her 3 years ago, last seen in our office 11/17/2021.  She has chronic bronchitic symptoms, has been seen and treated for exacerbations, suspected pneumonia on several occasions since I last saw her, most recently July 2023.  Currently managed on Stiolto, has DuoNeb which she uses very rarely.  She is on oxygen at 2-3 L/min. Today she reports that she is improving. There has been some question about possible intermittent aspiration. She is in a wheelchair, states that she feels like breathing is not limiting her. She works w PT 3x a week and can sometimes get SOB with that exercise. No cough.     Review of Systems  Constitutional:  Negative for fever and unexpected weight change.  HENT:  Negative for congestion, dental problem, ear pain, nosebleeds, postnasal drip,  rhinorrhea, sinus pressure, sneezing, sore throat and trouble swallowing.   Eyes:  Negative for redness and itching.  Respiratory:  Positive for chest tightness and shortness of breath. Negative for cough and wheezing.   Cardiovascular:  Negative for palpitations and leg swelling.  Gastrointestinal:  Negative for nausea and vomiting.  Genitourinary:  Negative for dysuria.  Musculoskeletal:  Negative for joint swelling.  Skin:  Negative for rash.  Neurological:  Negative for headaches.  Hematological:  Does not bruise/bleed easily.  Psychiatric/Behavioral:  Negative for dysphoric mood. The patient is not nervous/anxious.        Objective:   Physical Exam Vitals:   02/05/22 1422  BP: 132/80  Pulse: 65  Temp: 97.8 F (36.6 C)  SpO2: 95%  Height: 5' 4"  (1.626 m)    Gen: Pleasant, thin woman, in no distress,  normal affect  ENT: No lesions,  mouth clear,  oropharynx clear, no postnasal drip  Neck: No JVD, no stridor  Lungs: No use of accessory muscles, clear on normal respiration.  Crackles at both bases, no wheezing  Cardiovascular: RRR, heart sounds normal, no murmur or gallops, no peripheral edema  Musculoskeletal: No deformities, no cyanosis or clubbing  Neuro: awake, some dysarthria, subtle facial droop.   Skin: Warm, no lesions or rashes      Assessment & Plan:  COPD (chronic obstructive pulmonary disease) (HCC) Debilitated but multifactorial, not necessarily all due to daily respiratory status.  She has been seriously impacted by pneumonias.  Question whether she may  have aspiration although she is passed swallowing evaluations and is not on modified diet currently.  Discussed precautions with them today.  Plan to continue her Stiolto, DuoNeb as needed  Hypoxia Continue supplemental oxygen as ordered  Baltazar Apo, MD, PhD 02/05/2022, 2:51 PM [facility] [phone] or if no answer [phone]

## 2022-02-06 DIAGNOSIS — I63312 Cerebral infarction due to thrombosis of left middle cerebral artery: Secondary | ICD-10-CM | POA: Diagnosis not present

## 2022-02-06 DIAGNOSIS — R471 Dysarthria and anarthria: Secondary | ICD-10-CM | POA: Diagnosis not present

## 2022-02-06 DIAGNOSIS — R482 Apraxia: Secondary | ICD-10-CM | POA: Diagnosis not present

## 2022-02-06 DIAGNOSIS — I480 Paroxysmal atrial fibrillation: Secondary | ICD-10-CM | POA: Diagnosis not present

## 2022-02-06 DIAGNOSIS — R2689 Other abnormalities of gait and mobility: Secondary | ICD-10-CM | POA: Diagnosis not present

## 2022-02-06 DIAGNOSIS — M6389 Disorders of muscle in diseases classified elsewhere, multiple sites: Secondary | ICD-10-CM | POA: Diagnosis not present

## 2022-02-06 DIAGNOSIS — I5032 Chronic diastolic (congestive) heart failure: Secondary | ICD-10-CM | POA: Diagnosis not present

## 2022-02-06 DIAGNOSIS — R001 Bradycardia, unspecified: Secondary | ICD-10-CM | POA: Diagnosis not present

## 2022-02-06 DIAGNOSIS — J9601 Acute respiratory failure with hypoxia: Secondary | ICD-10-CM | POA: Diagnosis not present

## 2022-02-06 DIAGNOSIS — I6602 Occlusion and stenosis of left middle cerebral artery: Secondary | ICD-10-CM | POA: Diagnosis not present

## 2022-02-06 DIAGNOSIS — I69351 Hemiplegia and hemiparesis following cerebral infarction affecting right dominant side: Secondary | ICD-10-CM | POA: Diagnosis not present

## 2022-02-06 DIAGNOSIS — R278 Other lack of coordination: Secondary | ICD-10-CM | POA: Diagnosis not present

## 2022-02-07 DIAGNOSIS — R001 Bradycardia, unspecified: Secondary | ICD-10-CM | POA: Diagnosis not present

## 2022-02-07 DIAGNOSIS — M6389 Disorders of muscle in diseases classified elsewhere, multiple sites: Secondary | ICD-10-CM | POA: Diagnosis not present

## 2022-02-07 DIAGNOSIS — I63312 Cerebral infarction due to thrombosis of left middle cerebral artery: Secondary | ICD-10-CM | POA: Diagnosis not present

## 2022-02-07 DIAGNOSIS — I69351 Hemiplegia and hemiparesis following cerebral infarction affecting right dominant side: Secondary | ICD-10-CM | POA: Diagnosis not present

## 2022-02-07 DIAGNOSIS — R278 Other lack of coordination: Secondary | ICD-10-CM | POA: Diagnosis not present

## 2022-02-08 DIAGNOSIS — J9601 Acute respiratory failure with hypoxia: Secondary | ICD-10-CM | POA: Diagnosis not present

## 2022-02-08 DIAGNOSIS — R2689 Other abnormalities of gait and mobility: Secondary | ICD-10-CM | POA: Diagnosis not present

## 2022-02-08 DIAGNOSIS — I5032 Chronic diastolic (congestive) heart failure: Secondary | ICD-10-CM | POA: Diagnosis not present

## 2022-02-08 DIAGNOSIS — R001 Bradycardia, unspecified: Secondary | ICD-10-CM | POA: Diagnosis not present

## 2022-02-08 DIAGNOSIS — M6389 Disorders of muscle in diseases classified elsewhere, multiple sites: Secondary | ICD-10-CM | POA: Diagnosis not present

## 2022-02-08 DIAGNOSIS — I69351 Hemiplegia and hemiparesis following cerebral infarction affecting right dominant side: Secondary | ICD-10-CM | POA: Diagnosis not present

## 2022-02-08 DIAGNOSIS — I63312 Cerebral infarction due to thrombosis of left middle cerebral artery: Secondary | ICD-10-CM | POA: Diagnosis not present

## 2022-02-08 DIAGNOSIS — R278 Other lack of coordination: Secondary | ICD-10-CM | POA: Diagnosis not present

## 2022-02-11 DIAGNOSIS — I63312 Cerebral infarction due to thrombosis of left middle cerebral artery: Secondary | ICD-10-CM | POA: Diagnosis not present

## 2022-02-11 DIAGNOSIS — R001 Bradycardia, unspecified: Secondary | ICD-10-CM | POA: Diagnosis not present

## 2022-02-11 DIAGNOSIS — I5032 Chronic diastolic (congestive) heart failure: Secondary | ICD-10-CM | POA: Diagnosis not present

## 2022-02-11 DIAGNOSIS — R278 Other lack of coordination: Secondary | ICD-10-CM | POA: Diagnosis not present

## 2022-02-11 DIAGNOSIS — R293 Abnormal posture: Secondary | ICD-10-CM | POA: Diagnosis not present

## 2022-02-11 DIAGNOSIS — M62561 Muscle wasting and atrophy, not elsewhere classified, right lower leg: Secondary | ICD-10-CM | POA: Diagnosis not present

## 2022-02-11 DIAGNOSIS — M6389 Disorders of muscle in diseases classified elsewhere, multiple sites: Secondary | ICD-10-CM | POA: Diagnosis not present

## 2022-02-11 DIAGNOSIS — J9601 Acute respiratory failure with hypoxia: Secondary | ICD-10-CM | POA: Diagnosis not present

## 2022-02-11 DIAGNOSIS — R2689 Other abnormalities of gait and mobility: Secondary | ICD-10-CM | POA: Diagnosis not present

## 2022-02-11 DIAGNOSIS — I69351 Hemiplegia and hemiparesis following cerebral infarction affecting right dominant side: Secondary | ICD-10-CM | POA: Diagnosis not present

## 2022-02-13 DIAGNOSIS — I63312 Cerebral infarction due to thrombosis of left middle cerebral artery: Secondary | ICD-10-CM | POA: Diagnosis not present

## 2022-02-13 DIAGNOSIS — R278 Other lack of coordination: Secondary | ICD-10-CM | POA: Diagnosis not present

## 2022-02-13 DIAGNOSIS — I69351 Hemiplegia and hemiparesis following cerebral infarction affecting right dominant side: Secondary | ICD-10-CM | POA: Diagnosis not present

## 2022-02-13 DIAGNOSIS — R2689 Other abnormalities of gait and mobility: Secondary | ICD-10-CM | POA: Diagnosis not present

## 2022-02-13 DIAGNOSIS — R293 Abnormal posture: Secondary | ICD-10-CM | POA: Diagnosis not present

## 2022-02-13 DIAGNOSIS — I5032 Chronic diastolic (congestive) heart failure: Secondary | ICD-10-CM | POA: Diagnosis not present

## 2022-02-13 DIAGNOSIS — M6389 Disorders of muscle in diseases classified elsewhere, multiple sites: Secondary | ICD-10-CM | POA: Diagnosis not present

## 2022-02-13 DIAGNOSIS — R001 Bradycardia, unspecified: Secondary | ICD-10-CM | POA: Diagnosis not present

## 2022-02-13 DIAGNOSIS — J9601 Acute respiratory failure with hypoxia: Secondary | ICD-10-CM | POA: Diagnosis not present

## 2022-02-13 DIAGNOSIS — M62561 Muscle wasting and atrophy, not elsewhere classified, right lower leg: Secondary | ICD-10-CM | POA: Diagnosis not present

## 2022-02-14 DIAGNOSIS — I69351 Hemiplegia and hemiparesis following cerebral infarction affecting right dominant side: Secondary | ICD-10-CM | POA: Diagnosis not present

## 2022-02-14 DIAGNOSIS — R278 Other lack of coordination: Secondary | ICD-10-CM | POA: Diagnosis not present

## 2022-02-14 DIAGNOSIS — I63312 Cerebral infarction due to thrombosis of left middle cerebral artery: Secondary | ICD-10-CM | POA: Diagnosis not present

## 2022-02-14 DIAGNOSIS — M6389 Disorders of muscle in diseases classified elsewhere, multiple sites: Secondary | ICD-10-CM | POA: Diagnosis not present

## 2022-02-14 DIAGNOSIS — R001 Bradycardia, unspecified: Secondary | ICD-10-CM | POA: Diagnosis not present

## 2022-02-15 DIAGNOSIS — I480 Paroxysmal atrial fibrillation: Secondary | ICD-10-CM | POA: Diagnosis not present

## 2022-02-15 DIAGNOSIS — I63312 Cerebral infarction due to thrombosis of left middle cerebral artery: Secondary | ICD-10-CM | POA: Diagnosis not present

## 2022-02-15 DIAGNOSIS — J9601 Acute respiratory failure with hypoxia: Secondary | ICD-10-CM | POA: Diagnosis not present

## 2022-02-15 DIAGNOSIS — R001 Bradycardia, unspecified: Secondary | ICD-10-CM | POA: Diagnosis not present

## 2022-02-15 DIAGNOSIS — R278 Other lack of coordination: Secondary | ICD-10-CM | POA: Diagnosis not present

## 2022-02-15 DIAGNOSIS — I69351 Hemiplegia and hemiparesis following cerebral infarction affecting right dominant side: Secondary | ICD-10-CM | POA: Diagnosis not present

## 2022-02-15 DIAGNOSIS — I6602 Occlusion and stenosis of left middle cerebral artery: Secondary | ICD-10-CM | POA: Diagnosis not present

## 2022-02-15 DIAGNOSIS — R293 Abnormal posture: Secondary | ICD-10-CM | POA: Diagnosis not present

## 2022-02-15 DIAGNOSIS — I5032 Chronic diastolic (congestive) heart failure: Secondary | ICD-10-CM | POA: Diagnosis not present

## 2022-02-15 DIAGNOSIS — R482 Apraxia: Secondary | ICD-10-CM | POA: Diagnosis not present

## 2022-02-15 DIAGNOSIS — R471 Dysarthria and anarthria: Secondary | ICD-10-CM | POA: Diagnosis not present

## 2022-02-15 DIAGNOSIS — M6389 Disorders of muscle in diseases classified elsewhere, multiple sites: Secondary | ICD-10-CM | POA: Diagnosis not present

## 2022-02-15 DIAGNOSIS — R2689 Other abnormalities of gait and mobility: Secondary | ICD-10-CM | POA: Diagnosis not present

## 2022-02-18 DIAGNOSIS — I5032 Chronic diastolic (congestive) heart failure: Secondary | ICD-10-CM | POA: Diagnosis not present

## 2022-02-18 DIAGNOSIS — R2689 Other abnormalities of gait and mobility: Secondary | ICD-10-CM | POA: Diagnosis not present

## 2022-02-18 DIAGNOSIS — R278 Other lack of coordination: Secondary | ICD-10-CM | POA: Diagnosis not present

## 2022-02-18 DIAGNOSIS — R001 Bradycardia, unspecified: Secondary | ICD-10-CM | POA: Diagnosis not present

## 2022-02-18 DIAGNOSIS — I63312 Cerebral infarction due to thrombosis of left middle cerebral artery: Secondary | ICD-10-CM | POA: Diagnosis not present

## 2022-02-18 DIAGNOSIS — I69351 Hemiplegia and hemiparesis following cerebral infarction affecting right dominant side: Secondary | ICD-10-CM | POA: Diagnosis not present

## 2022-02-18 DIAGNOSIS — R293 Abnormal posture: Secondary | ICD-10-CM | POA: Diagnosis not present

## 2022-02-18 DIAGNOSIS — M62561 Muscle wasting and atrophy, not elsewhere classified, right lower leg: Secondary | ICD-10-CM | POA: Diagnosis not present

## 2022-02-18 DIAGNOSIS — M6389 Disorders of muscle in diseases classified elsewhere, multiple sites: Secondary | ICD-10-CM | POA: Diagnosis not present

## 2022-02-18 DIAGNOSIS — J9601 Acute respiratory failure with hypoxia: Secondary | ICD-10-CM | POA: Diagnosis not present

## 2022-02-19 DIAGNOSIS — M6389 Disorders of muscle in diseases classified elsewhere, multiple sites: Secondary | ICD-10-CM | POA: Diagnosis not present

## 2022-02-19 DIAGNOSIS — I63312 Cerebral infarction due to thrombosis of left middle cerebral artery: Secondary | ICD-10-CM | POA: Diagnosis not present

## 2022-02-19 DIAGNOSIS — I69351 Hemiplegia and hemiparesis following cerebral infarction affecting right dominant side: Secondary | ICD-10-CM | POA: Diagnosis not present

## 2022-02-19 DIAGNOSIS — R001 Bradycardia, unspecified: Secondary | ICD-10-CM | POA: Diagnosis not present

## 2022-02-19 DIAGNOSIS — R278 Other lack of coordination: Secondary | ICD-10-CM | POA: Diagnosis not present

## 2022-02-20 DIAGNOSIS — I63312 Cerebral infarction due to thrombosis of left middle cerebral artery: Secondary | ICD-10-CM | POA: Diagnosis not present

## 2022-02-20 DIAGNOSIS — R2689 Other abnormalities of gait and mobility: Secondary | ICD-10-CM | POA: Diagnosis not present

## 2022-02-20 DIAGNOSIS — R471 Dysarthria and anarthria: Secondary | ICD-10-CM | POA: Diagnosis not present

## 2022-02-20 DIAGNOSIS — I6602 Occlusion and stenosis of left middle cerebral artery: Secondary | ICD-10-CM | POA: Diagnosis not present

## 2022-02-20 DIAGNOSIS — R278 Other lack of coordination: Secondary | ICD-10-CM | POA: Diagnosis not present

## 2022-02-20 DIAGNOSIS — I69351 Hemiplegia and hemiparesis following cerebral infarction affecting right dominant side: Secondary | ICD-10-CM | POA: Diagnosis not present

## 2022-02-20 DIAGNOSIS — R482 Apraxia: Secondary | ICD-10-CM | POA: Diagnosis not present

## 2022-02-20 DIAGNOSIS — M6389 Disorders of muscle in diseases classified elsewhere, multiple sites: Secondary | ICD-10-CM | POA: Diagnosis not present

## 2022-02-20 DIAGNOSIS — I5032 Chronic diastolic (congestive) heart failure: Secondary | ICD-10-CM | POA: Diagnosis not present

## 2022-02-20 DIAGNOSIS — R293 Abnormal posture: Secondary | ICD-10-CM | POA: Diagnosis not present

## 2022-02-20 DIAGNOSIS — J9601 Acute respiratory failure with hypoxia: Secondary | ICD-10-CM | POA: Diagnosis not present

## 2022-02-20 DIAGNOSIS — I480 Paroxysmal atrial fibrillation: Secondary | ICD-10-CM | POA: Diagnosis not present

## 2022-02-20 DIAGNOSIS — R001 Bradycardia, unspecified: Secondary | ICD-10-CM | POA: Diagnosis not present

## 2022-02-21 DIAGNOSIS — R471 Dysarthria and anarthria: Secondary | ICD-10-CM | POA: Diagnosis not present

## 2022-02-21 DIAGNOSIS — R482 Apraxia: Secondary | ICD-10-CM | POA: Diagnosis not present

## 2022-02-21 DIAGNOSIS — I480 Paroxysmal atrial fibrillation: Secondary | ICD-10-CM | POA: Diagnosis not present

## 2022-02-21 DIAGNOSIS — I6602 Occlusion and stenosis of left middle cerebral artery: Secondary | ICD-10-CM | POA: Diagnosis not present

## 2022-02-25 DIAGNOSIS — R293 Abnormal posture: Secondary | ICD-10-CM | POA: Diagnosis not present

## 2022-02-25 DIAGNOSIS — I69351 Hemiplegia and hemiparesis following cerebral infarction affecting right dominant side: Secondary | ICD-10-CM | POA: Diagnosis not present

## 2022-02-25 DIAGNOSIS — I63312 Cerebral infarction due to thrombosis of left middle cerebral artery: Secondary | ICD-10-CM | POA: Diagnosis not present

## 2022-02-25 DIAGNOSIS — R001 Bradycardia, unspecified: Secondary | ICD-10-CM | POA: Diagnosis not present

## 2022-02-25 DIAGNOSIS — M6389 Disorders of muscle in diseases classified elsewhere, multiple sites: Secondary | ICD-10-CM | POA: Diagnosis not present

## 2022-02-25 DIAGNOSIS — R278 Other lack of coordination: Secondary | ICD-10-CM | POA: Diagnosis not present

## 2022-02-26 DIAGNOSIS — M6389 Disorders of muscle in diseases classified elsewhere, multiple sites: Secondary | ICD-10-CM | POA: Diagnosis not present

## 2022-02-26 DIAGNOSIS — I69351 Hemiplegia and hemiparesis following cerebral infarction affecting right dominant side: Secondary | ICD-10-CM | POA: Diagnosis not present

## 2022-02-26 DIAGNOSIS — R278 Other lack of coordination: Secondary | ICD-10-CM | POA: Diagnosis not present

## 2022-02-26 DIAGNOSIS — I63312 Cerebral infarction due to thrombosis of left middle cerebral artery: Secondary | ICD-10-CM | POA: Diagnosis not present

## 2022-02-26 DIAGNOSIS — R001 Bradycardia, unspecified: Secondary | ICD-10-CM | POA: Diagnosis not present

## 2022-02-27 DIAGNOSIS — I480 Paroxysmal atrial fibrillation: Secondary | ICD-10-CM | POA: Diagnosis not present

## 2022-02-27 DIAGNOSIS — I6602 Occlusion and stenosis of left middle cerebral artery: Secondary | ICD-10-CM | POA: Diagnosis not present

## 2022-02-27 DIAGNOSIS — R471 Dysarthria and anarthria: Secondary | ICD-10-CM | POA: Diagnosis not present

## 2022-02-27 DIAGNOSIS — R482 Apraxia: Secondary | ICD-10-CM | POA: Diagnosis not present

## 2022-02-28 DIAGNOSIS — I69351 Hemiplegia and hemiparesis following cerebral infarction affecting right dominant side: Secondary | ICD-10-CM | POA: Diagnosis not present

## 2022-02-28 DIAGNOSIS — R278 Other lack of coordination: Secondary | ICD-10-CM | POA: Diagnosis not present

## 2022-02-28 DIAGNOSIS — R001 Bradycardia, unspecified: Secondary | ICD-10-CM | POA: Diagnosis not present

## 2022-02-28 DIAGNOSIS — I63312 Cerebral infarction due to thrombosis of left middle cerebral artery: Secondary | ICD-10-CM | POA: Diagnosis not present

## 2022-02-28 DIAGNOSIS — M6389 Disorders of muscle in diseases classified elsewhere, multiple sites: Secondary | ICD-10-CM | POA: Diagnosis not present

## 2022-03-01 DIAGNOSIS — R278 Other lack of coordination: Secondary | ICD-10-CM | POA: Diagnosis not present

## 2022-03-01 DIAGNOSIS — I63312 Cerebral infarction due to thrombosis of left middle cerebral artery: Secondary | ICD-10-CM | POA: Diagnosis not present

## 2022-03-01 DIAGNOSIS — I69351 Hemiplegia and hemiparesis following cerebral infarction affecting right dominant side: Secondary | ICD-10-CM | POA: Diagnosis not present

## 2022-03-01 DIAGNOSIS — M6389 Disorders of muscle in diseases classified elsewhere, multiple sites: Secondary | ICD-10-CM | POA: Diagnosis not present

## 2022-03-01 DIAGNOSIS — R001 Bradycardia, unspecified: Secondary | ICD-10-CM | POA: Diagnosis not present

## 2022-03-04 DIAGNOSIS — R293 Abnormal posture: Secondary | ICD-10-CM | POA: Diagnosis not present

## 2022-03-04 DIAGNOSIS — R278 Other lack of coordination: Secondary | ICD-10-CM | POA: Diagnosis not present

## 2022-03-04 DIAGNOSIS — I63312 Cerebral infarction due to thrombosis of left middle cerebral artery: Secondary | ICD-10-CM | POA: Diagnosis not present

## 2022-03-04 DIAGNOSIS — M6389 Disorders of muscle in diseases classified elsewhere, multiple sites: Secondary | ICD-10-CM | POA: Diagnosis not present

## 2022-03-04 DIAGNOSIS — I69351 Hemiplegia and hemiparesis following cerebral infarction affecting right dominant side: Secondary | ICD-10-CM | POA: Diagnosis not present

## 2022-03-04 DIAGNOSIS — R001 Bradycardia, unspecified: Secondary | ICD-10-CM | POA: Diagnosis not present

## 2022-03-05 DIAGNOSIS — R001 Bradycardia, unspecified: Secondary | ICD-10-CM | POA: Diagnosis not present

## 2022-03-05 DIAGNOSIS — I69351 Hemiplegia and hemiparesis following cerebral infarction affecting right dominant side: Secondary | ICD-10-CM | POA: Diagnosis not present

## 2022-03-05 DIAGNOSIS — R278 Other lack of coordination: Secondary | ICD-10-CM | POA: Diagnosis not present

## 2022-03-05 DIAGNOSIS — I63312 Cerebral infarction due to thrombosis of left middle cerebral artery: Secondary | ICD-10-CM | POA: Diagnosis not present

## 2022-03-05 DIAGNOSIS — M6389 Disorders of muscle in diseases classified elsewhere, multiple sites: Secondary | ICD-10-CM | POA: Diagnosis not present

## 2022-03-06 DIAGNOSIS — R278 Other lack of coordination: Secondary | ICD-10-CM | POA: Diagnosis not present

## 2022-03-06 DIAGNOSIS — I63312 Cerebral infarction due to thrombosis of left middle cerebral artery: Secondary | ICD-10-CM | POA: Diagnosis not present

## 2022-03-06 DIAGNOSIS — M6389 Disorders of muscle in diseases classified elsewhere, multiple sites: Secondary | ICD-10-CM | POA: Diagnosis not present

## 2022-03-06 DIAGNOSIS — R001 Bradycardia, unspecified: Secondary | ICD-10-CM | POA: Diagnosis not present

## 2022-03-06 DIAGNOSIS — I69351 Hemiplegia and hemiparesis following cerebral infarction affecting right dominant side: Secondary | ICD-10-CM | POA: Diagnosis not present

## 2022-03-11 ENCOUNTER — Non-Acute Institutional Stay (SKILLED_NURSING_FACILITY): Payer: Medicare Other | Admitting: Internal Medicine

## 2022-03-11 ENCOUNTER — Encounter: Payer: Self-pay | Admitting: Internal Medicine

## 2022-03-11 DIAGNOSIS — F331 Major depressive disorder, recurrent, moderate: Secondary | ICD-10-CM

## 2022-03-11 DIAGNOSIS — J439 Emphysema, unspecified: Secondary | ICD-10-CM

## 2022-03-11 DIAGNOSIS — I63312 Cerebral infarction due to thrombosis of left middle cerebral artery: Secondary | ICD-10-CM | POA: Diagnosis not present

## 2022-03-11 DIAGNOSIS — I2510 Atherosclerotic heart disease of native coronary artery without angina pectoris: Secondary | ICD-10-CM

## 2022-03-11 DIAGNOSIS — I5032 Chronic diastolic (congestive) heart failure: Secondary | ICD-10-CM | POA: Diagnosis not present

## 2022-03-11 DIAGNOSIS — I69351 Hemiplegia and hemiparesis following cerebral infarction affecting right dominant side: Secondary | ICD-10-CM | POA: Diagnosis not present

## 2022-03-11 DIAGNOSIS — R293 Abnormal posture: Secondary | ICD-10-CM | POA: Diagnosis not present

## 2022-03-11 DIAGNOSIS — I480 Paroxysmal atrial fibrillation: Secondary | ICD-10-CM

## 2022-03-11 DIAGNOSIS — K5901 Slow transit constipation: Secondary | ICD-10-CM | POA: Diagnosis not present

## 2022-03-11 DIAGNOSIS — R001 Bradycardia, unspecified: Secondary | ICD-10-CM | POA: Diagnosis not present

## 2022-03-11 DIAGNOSIS — I10 Essential (primary) hypertension: Secondary | ICD-10-CM

## 2022-03-11 DIAGNOSIS — M6389 Disorders of muscle in diseases classified elsewhere, multiple sites: Secondary | ICD-10-CM | POA: Diagnosis not present

## 2022-03-11 DIAGNOSIS — R278 Other lack of coordination: Secondary | ICD-10-CM | POA: Diagnosis not present

## 2022-03-11 DIAGNOSIS — E782 Mixed hyperlipidemia: Secondary | ICD-10-CM

## 2022-03-11 DIAGNOSIS — K8020 Calculus of gallbladder without cholecystitis without obstruction: Secondary | ICD-10-CM | POA: Diagnosis not present

## 2022-03-11 NOTE — Progress Notes (Addendum)
Location:  Medical illustrator of Service:  SNF ([id number])  Provider: [name]   Code Status: Full COde Goals of Care:     11/27/2021    4:47 PM  Advanced Directives  Does Patient Have a Medical Advance Directive? No  Does patient want to make changes to medical advance directive? No - Patient declined     Chief Complaint  Patient presents with   Chronic Care Management    HPI: Patient is a 76 y.o. female seen today for medical management of chronic diseases.    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 No Behavior issues Her only complain was hearing Pounding sounds  Continuous Even at night both ears She is convinced it is due to work going on outside but no one else hears it  Her weight is stable Walks with her walker and Mod Assist No Falls Wt Readings from Last 3 Encounters:  03/11/22 98 lb 3.2 oz (44.5 kg)  01/25/22 97 lb 8 oz (44.2 kg)  01/21/22 99 lb 9.6 oz (45.2 kg)    Past Medical History:  Diagnosis Date   Anxiety    Arthritis    "some in my lower back; probably elbows, knees" (11/18/2017)   Atrial fibrillation (HCC)    Bell's palsy    when pt. was 76 yrs old, when under stress the left side of face will droop.   Complication of anesthesia    "vascular OR 2016; BP bottomed out; couldn't get it regulated; ended up in ICU for DAYS" (11/18/2017)   GERD (gastroesophageal reflux disease)    History of kidney stones    Hypertension    Hypertrophic cardiomyopathy (HCC)    severe LV basilar hypertrophy witn no evidence of significant outflow tract obstruction, EF 65-70%, mild LAE, mild TR, grade 1a diastolic dysfunction 05/15/10 (Dr. Donato Schultz) (Atrial Septal Hypertrophy pattern)-- Intra-op TEE with dsignificant outflow tract obstruction - AI, MR & TR   Insomnia    Mild aortic sclerosis    Osteopenia    Peripheral vascular disease (HCC)    Syncope    , Vagal    Past Surgical History:  Procedure Laterality Date   AUGMENTATION MAMMAPLASTY Bilateral    BACK SURGERY     CARDIAC CATHETERIZATION N/A 05/07/2016   Procedure: Left Heart Cath and Coronary Angiography;  Surgeon: Kathleene Hazel, MD;  Location: [facility] INVASIVE CV LAB;  Service: Cardiovascular;  Laterality: N/A;   CARDIOVERSION N/A 09/24/2017   Procedure: CARDIOVERSION;  Surgeon: Laurey Morale, MD;  Location: [facility] ENDOSCOPY;  Service: Cardiovascular;  Laterality: N/A;   DILATION AND CURETTAGE OF UTERUS     ENDARTERECTOMY FEMORAL Right 03/02/2015   Procedure: ENDARTERECTOMY RIGHT FEMORAL;  Surgeon: Chuck Hint, MD;  [facility] OR;  Service: Vascular;  Laterality: Right;   ESOPHAGOGASTRODUODENOSCOPY (EGD) WITH PROPOFOL N/A 12/01/2020   Procedure: ESOPHAGOGASTRODUODENOSCOPY (EGD) WITH PROPOFOL;  Surgeon: Diamantina Monks, MD;  Location: [facility] ENDOSCOPY;  Service: General;  Laterality: N/A;   FACIAL COSMETIC SURGERY Left 2002   "related to Bell's Palsy @ age 62; left eye/side of face droopy; tried to make area symmetrical"  FEMORAL-POPLITEAL BYPASS GRAFT Right 03/02/2015   Procedure: BYPASS GRAFT FEMORAL-BELOW KNEE POPLITEAL ARTERY;  Surgeon: Angelia Mould, MD;  Location: [place];  Service: Vascular;  Laterality: Right;   INGUINAL HERNIA REPAIR Bilateral 2002   IR ANGIO INTRA EXTRACRAN SEL COM CAROTID INNOMINATE UNI L MOD SED  11/16/2020   IR CT HEAD LTD  11/13/2020   IR PERCUTANEOUS ART THROMBECTOMY/INFUSION INTRACRANIAL INC DIAG ANGIO  11/13/2020   OVARIAN CYST REMOVAL Left    PEG PLACEMENT N/A 12/01/2020   Procedure: PERCUTANEOUS ENDOSCOPIC GASTROSTOMY (PEG) PLACEMENT;  Surgeon: Jesusita Oka, MD;  Location: [place];  Service: General;   Laterality: N/A;   PERIPHERAL VASCULAR CATHETERIZATION N/A 01/16/2015   Procedure: Abdominal Aortogram;  Surgeon: Angelia Mould, MD;  Location: [place] CV LAB;  Service: Cardiovascular;  Laterality: N/A;   POSTERIOR LUMBAR FUSION  2015   "have plates and screws in there"   [place] N/A 11/13/2020   Procedure: IR WITH ANESTHESIA;  Surgeon: Radiologist, Medication, MD;  Location: [place];  Service: Radiology;  Laterality: N/A;   TONSILLECTOMY      Allergies  Allergen Reactions   Amoxicillin Other (See Comments)    UTI Has patient had a PCN reaction causing immediate rash, facial/tongue/throat swelling, SOB or lightheadedness with hypotension: No Has patient had a PCN reaction causing severe rash involving mucus membranes or skin necrosis: No Has patient had a PCN reaction that required hospitalization: No Has patient had a PCN reaction occurring within the last 10 years: Yes--UTI ONLY If all of the above answers are "NO", then may proceed with Cephalosporin use.    Atenolol Cough   Crestor [Rosuvastatin Calcium] Other (See Comments)    Muscle aches   Pravastatin Other (See Comments)    Muscle aches   Rosuvastatin Calcium    Sulfa Antibiotics Nausea Only   Codeine Other (See Comments)    hallucinations    Outpatient Encounter Medications as of 03/11/2022  Medication Sig   acetaminophen (TYLENOL) 325 MG tablet Take 325 mg by mouth 2 (two) times daily as needed for mild pain.   aluminum-magnesium hydroxide-simethicone (MAALOX) I037812 MG/5ML SUSP Take 30 mLs by mouth every 4 (four) hours as needed (heartburn).   amiodarone (PACERONE) 200 MG tablet Take 200 mg by mouth daily. May give crushed   amLODipine (NORVASC) 5 MG tablet Take 5 mg by mouth daily.   apixaban (ELIQUIS) 5 MG TABS tablet Take 5 mg by mouth 2 (two) times daily. May give crushed   bisacodyl (DULCOLAX) 5 MG EC tablet Take 5 mg by mouth 3 (three) times daily as needed for moderate  constipation.   buPROPion (WELLBUTRIN XL) 150 MG 24 hr tablet Take 150 mg by mouth daily.   diclofenac Sodium (VOLTAREN) 1 % GEL Apply one application to right hip and knee four times daily as needed.   ezetimibe (ZETIA) 10 MG tablet Take 10 mg by mouth daily. Oral if crushed   furosemide (LASIX) 20 MG tablet Take 20 mg by mouth daily as needed. Give Lasix 20mg  for weight gain equal or greater than 3 pounds   hydrALAZINE (APRESOLINE) 10 MG tablet Take 10 mg by mouth 3 (three) times daily as needed (SBP > 165 or DBP > 95).   ipratropium-albuterol (DUONEB) 0.5-2.5 (3) MG/3ML SOLN Inhale 3 mLs into the lungs every 6 (six) hours as needed.   ketoconazole (NIZORAL) 2 % cream Apply 1 application  topically daily as needed for irritation.   nystatin cream (MYCOSTATIN) Apply  1 application topically 2 (two) times daily as needed for dry skin (apply to perirectal rash).   ondansetron (ZOFRAN) 4 MG tablet Take 4 mg by mouth every 6 (six) hours as needed for nausea or vomiting.   OXYGEN Inhale 3 L into the lungs as directed. to maintain sats > or equal to 90%. May titrate Three Times A Day; 07:00 AM - 03:00 PM, 03:00 PM - 11:00 PM, 11:00 PM - 7:00 am   pantoprazole (PROTONIX) 40 MG tablet Take 40 mg by mouth 2 (two) times daily.   Polyethyl Glycol-Propyl Glycol (SYSTANE) 0.4-0.3 % SOLN Place 2 drops into both eyes 4 (four) times daily as needed (irritation).   polyethylene glycol powder (GLYCOLAX/MIRALAX) 17 GM/SCOOP powder Take 17 g by mouth daily.   promethazine (PHENERGAN) 12.5 MG suppository Place 12.5 mg rectally every 6 (six) hours as needed for nausea or vomiting.   Tiotropium Bromide-Olodaterol (STIOLTO RESPIMAT) 2.5-2.5 MCG/ACT AERS Inhale 2 puffs into the lungs daily.   zinc oxide 20 % ointment Apply 1 application. topically 2 (two) times daily as needed for irritation (apply to perirectal area prn - use with nystatin).   zolpidem (AMBIEN) 5 MG tablet Take 0.5 tablets (2.5 mg total) by mouth at  bedtime as needed for sleep.   Facility-Administered Encounter Medications as of 03/11/2022  Medication   triamcinolone acetonide (KENALOG) 10 MG/ML injection 10 mg    Review of Systems:  Review of Systems  Constitutional:  Negative for activity change and appetite change.  HENT: Negative.    Respiratory:  Negative for cough and shortness of breath.   Cardiovascular:  Negative for leg swelling.  Gastrointestinal:  Negative for constipation.  Genitourinary: Negative.   Musculoskeletal:  Positive for gait problem. Negative for arthralgias and myalgias.  Skin: Negative.   Neurological:  Negative for dizziness and weakness.  Psychiatric/Behavioral:  Negative for confusion, dysphoric mood and sleep disturbance.     Health Maintenance  Topic Date Due   Medicare Annual Wellness (AWV)  Never done   Zoster Vaccines- Shingrix (1 of 2) Never done   Lung Cancer Screening  12/05/2021   COVID-19 Vaccine (6 - 2023-24 season) 12/14/2021   Hepatitis C Screening  04/15/2022 (Originally 04/25/1963)   Pneumonia Vaccine 72+ Years old  Completed   INFLUENZA VACCINE  Completed   HPV VACCINES  Aged Out   DEXA SCAN  Discontinued    Physical Exam: Vitals:   03/11/22 1503  BP: (!) 144/78  Pulse: (!) 58  Resp: 16  Temp: 97.9 F (36.6 C)  SpO2: 94%  Weight: 98 lb 3.2 oz (44.5 kg)   Body mass index is 16.86 kg/m. Physical Exam Vitals reviewed.  Constitutional:      Appearance: Normal appearance.  HENT:     Head: Normocephalic.     Ears:     Comments: Impacted wax in both ears    Nose: Nose normal.     Mouth/Throat:     Mouth: Mucous membranes are moist.     Pharynx: Oropharynx is clear.  Eyes:     Pupils: Pupils are equal, round, and reactive to light.  Cardiovascular:     Rate and Rhythm: Normal rate and regular rhythm.     Pulses: Normal pulses.     Heart sounds: Normal heart sounds. No murmur heard. Pulmonary:     Effort: Pulmonary effort is normal.     Breath sounds: Normal  breath sounds.  Abdominal:     General: Abdomen is flat. Bowel sounds are  normal.     Palpations: Abdomen is soft.  Musculoskeletal:        General: No swelling.     Cervical back: Neck supple.  Skin:    General: Skin is warm.  Neurological:     Mental Status: She is alert and oriented to person, place, and time.     Comments: Right spastic Hemiparesis Expressive Aphasia  Psychiatric:        Mood and Affect: Mood normal.        Thought Content: Thought content normal.     Labs reviewed: Basic Metabolic Panel: Recent Labs    07/06/21 1605 07/06/21 1612 07/06/21 2059 07/07/21 0800 07/08/21 0717 07/09/21 0317 07/11/21 0000 09/18/21 0000 12/03/21 0000  NA 138   < >  --  140 141 141 141 135* 139  K 4.4   < >  --  4.3 4.0 3.8 4.1 5.1 4.4  CL 104   < >  --  107 105 109 103 101 104  CO2 30  --   --  27 28 29  26* 27* 27*  GLUCOSE 92   < >  --  80 87 90  --   --   --   BUN 30*   < >  --  26* 22 21 24* 32* 28*  CREATININE 0.98   < >  --  0.79 0.71 0.73 0.7 0.9 0.9  CALCIUM 8.8*  --   --  8.7* 9.0 8.7* 9.3 8.8 9.0  MG 2.1  --   --   --  1.9 1.9  --   --   --   TSH  --   --  0.632  --   --   --   --   --   --    < > = values in this interval not displayed.   Liver Function Tests: Recent Labs    07/06/21 1605 12/03/21 0000 12/10/21 0000  AST 33 38* 32  ALT 45* 48* 38*  ALKPHOS 76 122 122  BILITOT 0.7  --   --   PROT 6.2*  --   --   ALBUMIN 3.2* 3.4* 3.5   No results for input(s): "LIPASE", "AMYLASE" in the last 8760 hours. No results for input(s): "AMMONIA" in the last 8760 hours. CBC: Recent Labs    07/06/21 1605 07/06/21 1612 07/08/21 0717 07/09/21 0317 07/11/21 0000  WBC 9.3  --  9.8 9.5 8.3  NEUTROABS 6.5  --   --   --   --   HGB 14.6   < > 14.9 14.3 14.1  HCT 45.2   < > 45.4 43.7 42  MCV 93.0  --  91.2 90.7  --   PLT 241  --  235 237 224   < > = values in this interval not displayed.   Lipid Panel: Recent Labs    11/22/21 0000 11/22/21 0600  CHOL  182 182  HDL 80* 80*  LDLCALC 89 89  TRIG 64 64   Lab Results  Component Value Date   HGBA1C 6.0 (H) 11/13/2020    Procedures since last visit: No results found.  Assessment/Plan 1. PAF (paroxysmal atrial fibrillation) (HCC) Eliquis and Amiodarone Check TSH 2. Essential hypertension Amlodipine  3.Right Hemiparesis Doing well with therapy Can walk but with Mild Assist and Her brace  4. Pulmonary emphysema, unspecified emphysema type ([place]) Oxygen and [place]  5. Mixed hyperlipidemia Zetia  6. Slow transit constipation Miralax PRn  7. Chronic diastolic congestive  heart failure (HCC) with HOCM Use Lasix PRn  8. Gallstones They choose to monitor it for now. Not symptomatic  9. Moderate episode of recurrent major depressive disorder (HCC) Wellbutrin Doing well  10 Hearing Noises ? Tinnitus Ear wax Wash written Discussed seeing ENT if not better 11 GERD On Protonix  Labs/tests ordered:  CBC,CMP,TSH  Virgie Dad, MD  Total time spent in this patient care encounter was  45_  minutes; greater than 50% of the visit spent counseling patient and staff, reviewing records , Labs and coordinating care for problems addressed at this encounter.

## 2022-03-13 DIAGNOSIS — R471 Dysarthria and anarthria: Secondary | ICD-10-CM | POA: Diagnosis not present

## 2022-03-13 DIAGNOSIS — M6389 Disorders of muscle in diseases classified elsewhere, multiple sites: Secondary | ICD-10-CM | POA: Diagnosis not present

## 2022-03-13 DIAGNOSIS — I63312 Cerebral infarction due to thrombosis of left middle cerebral artery: Secondary | ICD-10-CM | POA: Diagnosis not present

## 2022-03-13 DIAGNOSIS — I69351 Hemiplegia and hemiparesis following cerebral infarction affecting right dominant side: Secondary | ICD-10-CM | POA: Diagnosis not present

## 2022-03-13 DIAGNOSIS — I6602 Occlusion and stenosis of left middle cerebral artery: Secondary | ICD-10-CM | POA: Diagnosis not present

## 2022-03-13 DIAGNOSIS — I480 Paroxysmal atrial fibrillation: Secondary | ICD-10-CM | POA: Diagnosis not present

## 2022-03-13 DIAGNOSIS — R482 Apraxia: Secondary | ICD-10-CM | POA: Diagnosis not present

## 2022-03-13 DIAGNOSIS — R278 Other lack of coordination: Secondary | ICD-10-CM | POA: Diagnosis not present

## 2022-03-13 DIAGNOSIS — R001 Bradycardia, unspecified: Secondary | ICD-10-CM | POA: Diagnosis not present

## 2022-03-15 DIAGNOSIS — R293 Abnormal posture: Secondary | ICD-10-CM | POA: Diagnosis not present

## 2022-03-15 DIAGNOSIS — R471 Dysarthria and anarthria: Secondary | ICD-10-CM | POA: Diagnosis not present

## 2022-03-15 DIAGNOSIS — R001 Bradycardia, unspecified: Secondary | ICD-10-CM | POA: Diagnosis not present

## 2022-03-15 DIAGNOSIS — I63312 Cerebral infarction due to thrombosis of left middle cerebral artery: Secondary | ICD-10-CM | POA: Diagnosis not present

## 2022-03-15 DIAGNOSIS — I69351 Hemiplegia and hemiparesis following cerebral infarction affecting right dominant side: Secondary | ICD-10-CM | POA: Diagnosis not present

## 2022-03-15 DIAGNOSIS — M6389 Disorders of muscle in diseases classified elsewhere, multiple sites: Secondary | ICD-10-CM | POA: Diagnosis not present

## 2022-03-15 DIAGNOSIS — R278 Other lack of coordination: Secondary | ICD-10-CM | POA: Diagnosis not present

## 2022-03-15 DIAGNOSIS — I6602 Occlusion and stenosis of left middle cerebral artery: Secondary | ICD-10-CM | POA: Diagnosis not present

## 2022-03-15 DIAGNOSIS — I480 Paroxysmal atrial fibrillation: Secondary | ICD-10-CM | POA: Diagnosis not present

## 2022-03-15 DIAGNOSIS — R482 Apraxia: Secondary | ICD-10-CM | POA: Diagnosis not present

## 2022-03-18 DIAGNOSIS — M6389 Disorders of muscle in diseases classified elsewhere, multiple sites: Secondary | ICD-10-CM | POA: Diagnosis not present

## 2022-03-18 DIAGNOSIS — R293 Abnormal posture: Secondary | ICD-10-CM | POA: Diagnosis not present

## 2022-03-18 DIAGNOSIS — I10 Essential (primary) hypertension: Secondary | ICD-10-CM | POA: Diagnosis not present

## 2022-03-18 DIAGNOSIS — R001 Bradycardia, unspecified: Secondary | ICD-10-CM | POA: Diagnosis not present

## 2022-03-18 DIAGNOSIS — I63312 Cerebral infarction due to thrombosis of left middle cerebral artery: Secondary | ICD-10-CM | POA: Diagnosis not present

## 2022-03-18 DIAGNOSIS — I69351 Hemiplegia and hemiparesis following cerebral infarction affecting right dominant side: Secondary | ICD-10-CM | POA: Diagnosis not present

## 2022-03-18 DIAGNOSIS — R278 Other lack of coordination: Secondary | ICD-10-CM | POA: Diagnosis not present

## 2022-03-18 LAB — BASIC METABOLIC PANEL
BUN: 31 — AB (ref 4–21)
CO2: 26 — AB (ref 13–22)
Chloride: 106 (ref 99–108)
Creatinine: 0.9 (ref 0.5–1.1)
Glucose: 82
Potassium: 4.3 mEq/L (ref 3.5–5.1)
Sodium: 140 (ref 137–147)

## 2022-03-18 LAB — HEPATIC FUNCTION PANEL
ALT: 39 U/L — AB (ref 7–35)
AST: 33 (ref 13–35)
Alkaline Phosphatase: 89 (ref 25–125)
Bilirubin, Total: 0.6

## 2022-03-18 LAB — COMPREHENSIVE METABOLIC PANEL
Albumin: 3.4 — AB (ref 3.5–5.0)
Calcium: 8.9 (ref 8.7–10.7)
Globulin: 2
eGFR: 65

## 2022-03-18 LAB — CBC AND DIFFERENTIAL
HCT: 41 (ref 36–46)
Hemoglobin: 13.3 (ref 12.0–16.0)
Platelets: 224 10*3/uL (ref 150–400)
WBC: 5.9

## 2022-03-18 LAB — CBC: RBC: 4.55 (ref 3.87–5.11)

## 2022-03-18 LAB — TSH: TSH: 0.23 — AB (ref 0.41–5.90)

## 2022-03-20 DIAGNOSIS — R482 Apraxia: Secondary | ICD-10-CM | POA: Diagnosis not present

## 2022-03-20 DIAGNOSIS — R471 Dysarthria and anarthria: Secondary | ICD-10-CM | POA: Diagnosis not present

## 2022-03-20 DIAGNOSIS — I63312 Cerebral infarction due to thrombosis of left middle cerebral artery: Secondary | ICD-10-CM | POA: Diagnosis not present

## 2022-03-20 DIAGNOSIS — R001 Bradycardia, unspecified: Secondary | ICD-10-CM | POA: Diagnosis not present

## 2022-03-20 DIAGNOSIS — M6389 Disorders of muscle in diseases classified elsewhere, multiple sites: Secondary | ICD-10-CM | POA: Diagnosis not present

## 2022-03-20 DIAGNOSIS — I6602 Occlusion and stenosis of left middle cerebral artery: Secondary | ICD-10-CM | POA: Diagnosis not present

## 2022-03-20 DIAGNOSIS — I480 Paroxysmal atrial fibrillation: Secondary | ICD-10-CM | POA: Diagnosis not present

## 2022-03-20 DIAGNOSIS — R278 Other lack of coordination: Secondary | ICD-10-CM | POA: Diagnosis not present

## 2022-03-20 DIAGNOSIS — I69351 Hemiplegia and hemiparesis following cerebral infarction affecting right dominant side: Secondary | ICD-10-CM | POA: Diagnosis not present

## 2022-03-22 DIAGNOSIS — R001 Bradycardia, unspecified: Secondary | ICD-10-CM | POA: Diagnosis not present

## 2022-03-22 DIAGNOSIS — I480 Paroxysmal atrial fibrillation: Secondary | ICD-10-CM | POA: Diagnosis not present

## 2022-03-22 DIAGNOSIS — R471 Dysarthria and anarthria: Secondary | ICD-10-CM | POA: Diagnosis not present

## 2022-03-22 DIAGNOSIS — M6389 Disorders of muscle in diseases classified elsewhere, multiple sites: Secondary | ICD-10-CM | POA: Diagnosis not present

## 2022-03-22 DIAGNOSIS — I6602 Occlusion and stenosis of left middle cerebral artery: Secondary | ICD-10-CM | POA: Diagnosis not present

## 2022-03-22 DIAGNOSIS — R278 Other lack of coordination: Secondary | ICD-10-CM | POA: Diagnosis not present

## 2022-03-22 DIAGNOSIS — R482 Apraxia: Secondary | ICD-10-CM | POA: Diagnosis not present

## 2022-03-22 DIAGNOSIS — I69351 Hemiplegia and hemiparesis following cerebral infarction affecting right dominant side: Secondary | ICD-10-CM | POA: Diagnosis not present

## 2022-03-22 DIAGNOSIS — I63312 Cerebral infarction due to thrombosis of left middle cerebral artery: Secondary | ICD-10-CM | POA: Diagnosis not present

## 2022-03-25 ENCOUNTER — Encounter: Payer: Self-pay | Admitting: Neurology

## 2022-03-25 ENCOUNTER — Ambulatory Visit (INDEPENDENT_AMBULATORY_CARE_PROVIDER_SITE_OTHER): Payer: Medicare Other | Admitting: Neurology

## 2022-03-25 VITALS — BP 103/63 | HR 49 | Ht 64.0 in | Wt 99.0 lb

## 2022-03-25 DIAGNOSIS — R278 Other lack of coordination: Secondary | ICD-10-CM | POA: Diagnosis not present

## 2022-03-25 DIAGNOSIS — I69351 Hemiplegia and hemiparesis following cerebral infarction affecting right dominant side: Secondary | ICD-10-CM | POA: Diagnosis not present

## 2022-03-25 DIAGNOSIS — G8191 Hemiplegia, unspecified affecting right dominant side: Secondary | ICD-10-CM | POA: Diagnosis not present

## 2022-03-25 DIAGNOSIS — I63132 Cerebral infarction due to embolism of left carotid artery: Secondary | ICD-10-CM | POA: Diagnosis not present

## 2022-03-25 DIAGNOSIS — R293 Abnormal posture: Secondary | ICD-10-CM | POA: Diagnosis not present

## 2022-03-25 DIAGNOSIS — I63312 Cerebral infarction due to thrombosis of left middle cerebral artery: Secondary | ICD-10-CM | POA: Diagnosis not present

## 2022-03-25 DIAGNOSIS — I5032 Chronic diastolic (congestive) heart failure: Secondary | ICD-10-CM | POA: Diagnosis not present

## 2022-03-25 DIAGNOSIS — M6389 Disorders of muscle in diseases classified elsewhere, multiple sites: Secondary | ICD-10-CM | POA: Diagnosis not present

## 2022-03-25 NOTE — Progress Notes (Signed)
Chief Complaint  Patient presents with   New Patient (Initial Visit)    Rm 15. Accompanied by Beverly Li. NX Beverly Li 2023/Paper referral for botox eval dor RUE/Beverly Wert, NP [facility].      ASSESSMENT AND PLAN  Beverly Li is a 76 y.o. 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   MEDICAL HISTORY:  Beverly Li is a 76 year old female, accompanied by her husband, seen in request by his primary care at [facility] Dr. Einar Crow for evaluation of botulism toxin injection for spastic right hemiparesis, she is also seen by our clinic for stroke follow-up   I reviewed and summarized the referring note.PMHx. A fib,  Eliquis HTN Peripheral vascular disease. COPD   She suffered stroke on November 13, 2020 presenting with right-sided weakness, right facial droop, left gaze preference,  MRI showed left ACA/MCA stroke, status post IR with TICI, reperfusion of left MCA, in the setting of paroxysmal atrial fibrillation taking off Eliquis for 1 week due to abdominal hematoma  CT angiogram head and neck and four-vessel angiogram postprocedure showed status post revascularization of the occluded left middle cerebral artery M1 segment, and proximal flow arrest achieved TICI 3 revascularization, severe preocclusive stenosis of the proximal right internal carotid artery associated with a delayed strain signs,  Known history of peripheral vascular disease, CT angiogram November 25, 2020 suspicious for occlusion of  distal right femoral-popliteal bypass, low cardiac output, and distal high-grade stenosis/resistance also contributed to the unopacified distal bypass conduit, Absent of right side tibial arterial opacification, moderate to advanced aortic atherosclerosis bilateral iliac arterial disease, with no evidence of high-grade aortoiliac disease.  Echocardiogram in March 2023, ejection fraction 60 to 65%, no regional wall motion abnormality, moderate asymmetric left ventricular hypertrophy of the basal septal segment  She is not at [facility], with right hemiparesis, aphasia, husband reported that she continued to make small progress even now, physical therapy have suggested botulism toxin injection to alleviate her right shoulder and arm weakness     PHYSICAL EXAM:   Vitals:   03/25/22 1539  BP: 103/63  Pulse: (!) 49  Weight: 99 lb (44.9 kg)  Height: 5\' 4"  (1.626 m)   Not recorded     Body mass index is 16.99 kg/m.  PHYSICAL EXAMNIATION:  Gen: NAD, conversant, well nourised, well groomed                     Cardiovascular: Regular rate rhythm, no peripheral edema, warm, nontender. Eyes: Conjunctivae clear without exudates or hemorrhage Neck: Supple, no carotid bruits. Pulmonary: Clear to auscultation bilaterally   NEUROLOGICAL EXAM:  MENTAL STATUS: Speech/cognition: Global aphasia, paraphasic errors, slow in comprehension, word finding difficulties CRANIAL NERVES: CN II: Visual fields are full to confrontation. Pupils are round equal and briskly reactive to light. CN III, IV, VI: extraocular movement are normal. No ptosis. CN V: Facial sensation is intact to light touch CN VII: Face is symmetric with normal eye closure  CN VIII: Hearing is normal to causal conversation. CN IX,  X: Phonation is normal. CN XI: Head turning and shoulder shrug are intact  MOTOR: Right hemiparesis, mild spasticity of right upper extremity, no significant right upper extremity movement,  good range of motion of right shoulder, elbow, tendency for right thumb in position,  No antigravity movement of right lower extremity, mild spasticity, mild limited range of motion of right knee,  REFLEXES: Hyperreflexia right upper and lower extremity  SENSORY: Intact to light touch, pinprick and vibratory sensation are intact in fingers and toes.  COORDINATION: There is no trunk or limb dysmetria noted.  GAIT/STANCE: Deferred  REVIEW OF SYSTEMS:  Full 14 system review of systems performed and notable only for as above All other review of systems were negative.   ALLERGIES: Allergies  Allergen Reactions   Amoxicillin Other (See Comments)    UTI Has patient had a PCN reaction causing immediate rash, facial/tongue/throat swelling, SOB or lightheadedness with hypotension: No Has patient had a PCN reaction causing severe rash involving mucus membranes or skin necrosis: No Has patient had a PCN reaction that required hospitalization: No Has patient had a PCN reaction occurring within the last 10 years: Yes--UTI ONLY If all of the above answers are "NO", then may proceed with Cephalosporin use.    Atenolol Cough   Crestor [Rosuvastatin Calcium] Other (See Comments)    Muscle aches   Pravastatin Other (See Comments)    Muscle aches   Rosuvastatin Calcium    Sulfa Antibiotics Nausea Only   Codeine Other (See Comments)    hallucinations    HOME MEDICATIONS: Current Outpatient Medications  Medication Sig Dispense Refill   aluminum-magnesium hydroxide-simethicone (MAALOX) 200-200-20 MG/5ML SUSP Take 30 mLs by mouth every 4 (four) hours as needed (heartburn).     amiodarone (PACERONE) 200 MG tablet Take 200 mg by mouth daily. May give crushed     amLODipine (NORVASC) 5 MG tablet Take 5 mg by mouth daily.     apixaban (ELIQUIS) 5 MG TABS tablet Take 5 mg by mouth 2 (two) times daily. May give crushed     bisacodyl (DULCOLAX) 5 MG EC tablet Take 5 mg by mouth 3 (three) times daily  as needed for moderate constipation.     buPROPion (WELLBUTRIN XL) 150 MG 24 hr tablet Take 150 mg by mouth daily.     carbamide peroxide (DEBROX) 6.5 % OTIC solution 5 drops 2 (two) times daily.     diclofenac Sodium (VOLTAREN) 1 % GEL Apply one application to right hip and knee four times daily as needed.     ezetimibe (ZETIA) 10 MG tablet Take 10 mg by mouth daily. Oral if crushed     furosemide (LASIX) 20 MG tablet Take 20 mg by mouth daily as needed. Give Lasix 20mg  for weight gain equal or greater than 3 pounds     hydrALAZINE (APRESOLINE) 10 MG tablet Take 10 mg by mouth 3 (three) times daily as needed (SBP > 165 or DBP > 95).     ipratropium-albuterol (DUONEB) 0.5-2.5 (3) MG/3ML SOLN Inhale 3 mLs into the lungs every 6 (six) hours as needed.     ketoconazole (NIZORAL) 2 % cream Apply 1 application  topically daily as needed for irritation.     nystatin cream (MYCOSTATIN) Apply 1 application topically 2 (two) times daily as needed for dry skin (apply to perirectal rash).     ondansetron (ZOFRAN) 4 MG tablet Take 4 mg by mouth every 6 (six) hours as needed for nausea or vomiting.  OXYGEN Inhale 3 L into the lungs as directed. to maintain sats > or equal to 90%. May titrate Three Times A Day; 07:00 AM - 03:00 PM, 03:00 PM - 11:00 PM, 11:00 PM - 7:00 am     pantoprazole (PROTONIX) 40 MG tablet Take 40 mg by mouth 2 (two) times daily.     Polyethyl Glycol-Propyl Glycol (SYSTANE) 0.4-0.3 % SOLN Place 2 drops into both eyes 4 (four) times daily as needed (irritation).     polyethylene glycol powder (GLYCOLAX/MIRALAX) 17 GM/SCOOP powder Take 17 g by mouth daily.     promethazine (PHENERGAN) 12.5 MG suppository Place 12.5 mg rectally every 6 (six) hours as needed for nausea or vomiting.     Tiotropium Bromide-Olodaterol (STIOLTO RESPIMAT) 2.5-2.5 MCG/ACT AERS Inhale 2 puffs into the lungs daily.     zolpidem (AMBIEN) 5 MG tablet Take 0.5 tablets (2.5 mg total) by mouth at bedtime as needed for  sleep. 30 tablet 1   zinc oxide 20 % ointment Apply 1 application. topically 2 (two) times daily as needed for irritation (apply to perirectal area prn - use with nystatin). (Patient not taking: Reported on 03/25/2022)     Current Facility-Administered Medications  Medication Dose Route Frequency Provider Last Rate Last Admin   triamcinolone acetonide (KENALOG) 10 MG/ML injection 10 mg  10 mg Other Once Lenn Sinkegal, Norman S, DPM        PAST MEDICAL HISTORY: Past Medical History:  Diagnosis Date   Anxiety    Arthritis    "some in my lower back; probably elbows, knees" (11/18/2017)   Atrial fibrillation (HCC)    Bell's palsy    when pt. was 76 yrs old, when under stress the left side of face will droop.   Complication of anesthesia    "vascular OR 2016; BP bottomed out; couldn't get it regulated; ended up in ICU for DAYS" (11/18/2017)   GERD (gastroesophageal reflux disease)    History of kidney stones    Hypertension    Hypertrophic cardiomyopathy (HCC)    severe LV basilar hypertrophy witn no evidence of significant outflow tract obstruction, EF 65-70%, mild LAE, mild TR, grade 1a diastolic dysfunction 05/15/10 (Dr. Donato SchultzMark Skains) (Atrial Septal Hypertrophy pattern)-- Intra-op TEE with dsignificant outflow tract obstruction - AI, MR & TR   Insomnia    Mild aortic sclerosis    Osteopenia    Peripheral vascular disease (HCC)    Syncope    , Vagal    PAST SURGICAL HISTORY: Past Surgical History:  Procedure Laterality Date   AUGMENTATION MAMMAPLASTY Bilateral    BACK SURGERY     CARDIAC CATHETERIZATION N/A 05/07/2016   Procedure: Left Heart Cath and Coronary Angiography;  Surgeon: Kathleene Hazelhristopher D McAlhany, MD;  [facility];  Service: Cardiovascular;  Laterality: N/A;   CARDIOVERSION N/A 09/24/2017   Procedure: CARDIOVERSION;  Surgeon: Laurey MoraleMcLean, Dalton S, MD;  [facility];  Service: Cardiovascular;  Laterality: N/A;   DILATION AND CURETTAGE OF UTERUS     ENDARTERECTOMY  FEMORAL Right 03/02/2015   Procedure: ENDARTERECTOMY RIGHT FEMORAL;  Surgeon: Chuck Hinthristopher S Dickson, MD;  [facility];  Service: Vascular;  Laterality: Right;   ESOPHAGOGASTRODUODENOSCOPY (EGD) WITH PROPOFOL N/A 12/01/2020   Procedure: ESOPHAGOGASTRODUODENOSCOPY (EGD) WITH PROPOFOL;  Surgeon: Diamantina MonksLovick, Ayesha N, MD;  Location: [facility] ENDOSCOPY;  Service: General;  Laterality: N/A;   FACIAL COSMETIC SURGERY Left 2002   "related to Bell's Palsy @ age 76; left eye/side of face droopy; tried to make area symmetrical"  FEMORAL-POPLITEAL BYPASS GRAFT Right 03/02/2015   Procedure: BYPASS GRAFT FEMORAL-BELOW KNEE POPLITEAL ARTERY;  Surgeon: Chuck Hint, MD;  [facility];  Service: Vascular;  Laterality: Right;   INGUINAL HERNIA REPAIR Bilateral 2002   IR ANGIO INTRA EXTRACRAN SEL COM CAROTID INNOMINATE UNI L MOD SED  11/16/2020   IR CT HEAD LTD  11/13/2020   IR PERCUTANEOUS ART THROMBECTOMY/INFUSION INTRACRANIAL INC DIAG ANGIO  11/13/2020   OVARIAN CYST REMOVAL Left    PEG PLACEMENT N/A 12/01/2020   Procedure: PERCUTANEOUS ENDOSCOPIC GASTROSTOMY (PEG) PLACEMENT;  Surgeon: Diamantina Monks, MD;  Location: [facility] ENDOSCOPY;  Service: General;  Laterality: N/A;   PERIPHERAL VASCULAR CATHETERIZATION N/A 01/16/2015   Procedure: Abdominal Aortogram;  Surgeon: Chuck Hint, MD;  [facility] CV LAB;  Service: Cardiovascular;  Laterality: N/A;   POSTERIOR LUMBAR FUSION  2015   "have plates and screws in there"   RADIOLOGY WITH ANESTHESIA N/A 11/13/2020   Procedure: IR WITH ANESTHESIA;  Surgeon: Radiologist, Medication, MD;  Location: [facility] OR;  Service: Radiology;  Laterality: N/A;   TONSILLECTOMY      FAMILY HISTORY: Family History  Problem Relation Age of Onset   Liver cancer Mother    Cancer Mother        Liver   Hypertension Mother    Lung cancer Father    Cancer Father        Lung   Breast cancer Sister    Cancer Sister        Breast    SOCIAL HISTORY: Social History    Socioeconomic History   Marital status: Married    Spouse name: Optometrist   Number of children: Not on file   Years of education: Not on file   Highest education level: Not on file  Occupational History   Not on file  Tobacco Use   Smoking status: Former    Packs/day: 1.00    Years: 50.00    Total pack years: 50.00    Types: Cigarettes    Quit date: 12/15/2014    Years since quitting: 7.2   Smokeless tobacco: Never  Vaping Use   Vaping Use: Never used  Substance and Sexual Activity   Alcohol use: Yes    Comment: 11/18/2017 "might have a couple glasses of wine/month; if that"   Drug use: Not on file   Sexual activity: Not Currently  Other Topics Concern   Not on file  Social History Narrative   11/21/21 living at [facility]   Social Determinants of Health   Financial Resource Strain: Not on file  Food Insecurity: Not on file  Transportation Needs: Not on file  Physical Activity: Not on file  Stress: Not on file  Social Connections: Not on file  Intimate Partner Violence: Not on file      Levert Feinstein, M.D. Ph.D.  [facility] [address], [address] [place], [place] [address] Ph: [phone] Fax: [phone]  CC:  [name], MD [address] [place],  [place] [address]  [name], MD

## 2022-03-27 DIAGNOSIS — I6602 Occlusion and stenosis of left middle cerebral artery: Secondary | ICD-10-CM | POA: Diagnosis not present

## 2022-03-27 DIAGNOSIS — I63312 Cerebral infarction due to thrombosis of left middle cerebral artery: Secondary | ICD-10-CM | POA: Diagnosis not present

## 2022-03-27 DIAGNOSIS — I5032 Chronic diastolic (congestive) heart failure: Secondary | ICD-10-CM | POA: Diagnosis not present

## 2022-03-27 DIAGNOSIS — I480 Paroxysmal atrial fibrillation: Secondary | ICD-10-CM | POA: Diagnosis not present

## 2022-03-27 DIAGNOSIS — R482 Apraxia: Secondary | ICD-10-CM | POA: Diagnosis not present

## 2022-03-27 DIAGNOSIS — I69351 Hemiplegia and hemiparesis following cerebral infarction affecting right dominant side: Secondary | ICD-10-CM | POA: Diagnosis not present

## 2022-03-27 DIAGNOSIS — R278 Other lack of coordination: Secondary | ICD-10-CM | POA: Diagnosis not present

## 2022-03-27 DIAGNOSIS — M6389 Disorders of muscle in diseases classified elsewhere, multiple sites: Secondary | ICD-10-CM | POA: Diagnosis not present

## 2022-03-27 DIAGNOSIS — R471 Dysarthria and anarthria: Secondary | ICD-10-CM | POA: Diagnosis not present

## 2022-03-28 DIAGNOSIS — R471 Dysarthria and anarthria: Secondary | ICD-10-CM | POA: Diagnosis not present

## 2022-03-28 DIAGNOSIS — I480 Paroxysmal atrial fibrillation: Secondary | ICD-10-CM | POA: Diagnosis not present

## 2022-03-28 DIAGNOSIS — I6602 Occlusion and stenosis of left middle cerebral artery: Secondary | ICD-10-CM | POA: Diagnosis not present

## 2022-03-28 DIAGNOSIS — R482 Apraxia: Secondary | ICD-10-CM | POA: Diagnosis not present

## 2022-03-29 DIAGNOSIS — I63312 Cerebral infarction due to thrombosis of left middle cerebral artery: Secondary | ICD-10-CM | POA: Diagnosis not present

## 2022-03-29 DIAGNOSIS — R278 Other lack of coordination: Secondary | ICD-10-CM | POA: Diagnosis not present

## 2022-03-29 DIAGNOSIS — M6389 Disorders of muscle in diseases classified elsewhere, multiple sites: Secondary | ICD-10-CM | POA: Diagnosis not present

## 2022-03-29 DIAGNOSIS — I5032 Chronic diastolic (congestive) heart failure: Secondary | ICD-10-CM | POA: Diagnosis not present

## 2022-03-29 DIAGNOSIS — I69351 Hemiplegia and hemiparesis following cerebral infarction affecting right dominant side: Secondary | ICD-10-CM | POA: Diagnosis not present

## 2022-04-03 ENCOUNTER — Telehealth: Payer: Self-pay

## 2022-04-03 DIAGNOSIS — I63312 Cerebral infarction due to thrombosis of left middle cerebral artery: Secondary | ICD-10-CM | POA: Diagnosis not present

## 2022-04-03 DIAGNOSIS — R293 Abnormal posture: Secondary | ICD-10-CM | POA: Diagnosis not present

## 2022-04-03 DIAGNOSIS — I69351 Hemiplegia and hemiparesis following cerebral infarction affecting right dominant side: Secondary | ICD-10-CM | POA: Diagnosis not present

## 2022-04-03 DIAGNOSIS — M6389 Disorders of muscle in diseases classified elsewhere, multiple sites: Secondary | ICD-10-CM | POA: Diagnosis not present

## 2022-04-03 DIAGNOSIS — R278 Other lack of coordination: Secondary | ICD-10-CM | POA: Diagnosis not present

## 2022-04-03 DIAGNOSIS — I5032 Chronic diastolic (congestive) heart failure: Secondary | ICD-10-CM | POA: Diagnosis not present

## 2022-04-03 NOTE — Telephone Encounter (Signed)
New PA for Xeomin 500 units [id number], [id number], [phone].  Dx G81.91

## 2022-04-04 ENCOUNTER — Other Ambulatory Visit (HOSPITAL_COMMUNITY): Payer: Self-pay

## 2022-04-04 NOTE — Telephone Encounter (Signed)
Patient Advocate Encounter   Received notification that prior authorization for Xeomin 100UNIT solution is required.   PA submitted on 04/04/2022 Key [id number] Status is pending       Roland Earl, CPhT Pharmacy Patient Advocate Specialist [facility] Patient Advocate Team Direct Number: [phone]  Fax: [phone]

## 2022-04-09 ENCOUNTER — Other Ambulatory Visit (HOSPITAL_COMMUNITY): Payer: Self-pay

## 2022-04-09 NOTE — Telephone Encounter (Signed)
Patient Advocate Encounter   Received notification that prior authorization for Botox 100UNIT solution x 3 is required.   PA submitted on 04/09/2022 Key [id number] Status is pending       Roland Earl, CPhT Pharmacy Patient Advocate Specialist [facility] Patient Advocate Team Direct Number: [phone]  Fax: [phone]

## 2022-04-09 NOTE — Telephone Encounter (Signed)
Please try BOTOX 300 units

## 2022-04-09 NOTE — Telephone Encounter (Signed)
Patient Advocate Encounter  Prior Authorization for Botox 100UNIT solution x 3  has been approved.    PA# [id number] Effective dates: 04/09/2022 through 07/09/2022  Can be filled at [facility]    Roland Earl, CPhT Pharmacy Patient Advocate Specialist [facility] Patient Advocate Team Direct Number: [phone]  Fax: [phone]

## 2022-04-09 NOTE — Telephone Encounter (Signed)
Patient Advocate Encounter  Received notification that the request for prior authorization for Xeomin 100UNIT solution  has been denied due to need to try one of these covered drugs:  Botox or Dysort.        Roland Earl, CPhT Pharmacy Patient Advocate Specialist [facility] Patient Advocate Team Direct Number: [phone]  Fax: [phone]

## 2022-04-10 ENCOUNTER — Other Ambulatory Visit (HOSPITAL_COMMUNITY): Payer: Self-pay

## 2022-04-10 DIAGNOSIS — R293 Abnormal posture: Secondary | ICD-10-CM | POA: Diagnosis not present

## 2022-04-10 DIAGNOSIS — M6389 Disorders of muscle in diseases classified elsewhere, multiple sites: Secondary | ICD-10-CM | POA: Diagnosis not present

## 2022-04-10 DIAGNOSIS — I63312 Cerebral infarction due to thrombosis of left middle cerebral artery: Secondary | ICD-10-CM | POA: Diagnosis not present

## 2022-04-10 DIAGNOSIS — R278 Other lack of coordination: Secondary | ICD-10-CM | POA: Diagnosis not present

## 2022-04-10 DIAGNOSIS — I5032 Chronic diastolic (congestive) heart failure: Secondary | ICD-10-CM | POA: Diagnosis not present

## 2022-04-10 DIAGNOSIS — I69351 Hemiplegia and hemiparesis following cerebral infarction affecting right dominant side: Secondary | ICD-10-CM | POA: Diagnosis not present

## 2022-04-10 MED ORDER — BOTOX 100 UNITS IJ SOLR
INTRAMUSCULAR | 3 refills | Status: DC
  Filled 2022-04-10: qty 3, fill #0
  Filled 2022-05-14: qty 3, 90d supply, fill #0
  Filled 2022-08-05: qty 3, 90d supply, fill #1

## 2022-04-10 NOTE — Telephone Encounter (Signed)
Rx for botox sent to [facility].   Please call pt to schedule appt and pharmacy to schedule shipment.

## 2022-04-10 NOTE — Addendum Note (Signed)
Addended by: Ann Maki on: 04/10/2022 08:53 AM   Modules accepted: Orders

## 2022-04-10 NOTE — Telephone Encounter (Signed)
Called and scheduled with Dr. Terrace Arabia for 05/22/22 at 4:00 pm.

## 2022-04-17 ENCOUNTER — Other Ambulatory Visit (HOSPITAL_COMMUNITY): Payer: Self-pay

## 2022-04-17 DIAGNOSIS — I69351 Hemiplegia and hemiparesis following cerebral infarction affecting right dominant side: Secondary | ICD-10-CM | POA: Diagnosis not present

## 2022-04-17 DIAGNOSIS — R471 Dysarthria and anarthria: Secondary | ICD-10-CM | POA: Diagnosis not present

## 2022-04-17 DIAGNOSIS — I6602 Occlusion and stenosis of left middle cerebral artery: Secondary | ICD-10-CM | POA: Diagnosis not present

## 2022-04-17 DIAGNOSIS — I63312 Cerebral infarction due to thrombosis of left middle cerebral artery: Secondary | ICD-10-CM | POA: Diagnosis not present

## 2022-04-17 DIAGNOSIS — R278 Other lack of coordination: Secondary | ICD-10-CM | POA: Diagnosis not present

## 2022-04-17 DIAGNOSIS — M6389 Disorders of muscle in diseases classified elsewhere, multiple sites: Secondary | ICD-10-CM | POA: Diagnosis not present

## 2022-04-17 DIAGNOSIS — R482 Apraxia: Secondary | ICD-10-CM | POA: Diagnosis not present

## 2022-04-17 DIAGNOSIS — I5032 Chronic diastolic (congestive) heart failure: Secondary | ICD-10-CM | POA: Diagnosis not present

## 2022-04-17 DIAGNOSIS — I480 Paroxysmal atrial fibrillation: Secondary | ICD-10-CM | POA: Diagnosis not present

## 2022-04-17 DIAGNOSIS — R293 Abnormal posture: Secondary | ICD-10-CM | POA: Diagnosis not present

## 2022-04-18 DIAGNOSIS — I480 Paroxysmal atrial fibrillation: Secondary | ICD-10-CM | POA: Diagnosis not present

## 2022-04-18 DIAGNOSIS — R278 Other lack of coordination: Secondary | ICD-10-CM | POA: Diagnosis not present

## 2022-04-18 DIAGNOSIS — I5032 Chronic diastolic (congestive) heart failure: Secondary | ICD-10-CM | POA: Diagnosis not present

## 2022-04-18 DIAGNOSIS — R482 Apraxia: Secondary | ICD-10-CM | POA: Diagnosis not present

## 2022-04-18 DIAGNOSIS — M6389 Disorders of muscle in diseases classified elsewhere, multiple sites: Secondary | ICD-10-CM | POA: Diagnosis not present

## 2022-04-18 DIAGNOSIS — I63312 Cerebral infarction due to thrombosis of left middle cerebral artery: Secondary | ICD-10-CM | POA: Diagnosis not present

## 2022-04-18 DIAGNOSIS — I69351 Hemiplegia and hemiparesis following cerebral infarction affecting right dominant side: Secondary | ICD-10-CM | POA: Diagnosis not present

## 2022-04-18 DIAGNOSIS — R293 Abnormal posture: Secondary | ICD-10-CM | POA: Diagnosis not present

## 2022-04-18 DIAGNOSIS — R471 Dysarthria and anarthria: Secondary | ICD-10-CM | POA: Diagnosis not present

## 2022-04-18 DIAGNOSIS — I6602 Occlusion and stenosis of left middle cerebral artery: Secondary | ICD-10-CM | POA: Diagnosis not present

## 2022-04-19 DIAGNOSIS — I63312 Cerebral infarction due to thrombosis of left middle cerebral artery: Secondary | ICD-10-CM | POA: Diagnosis not present

## 2022-04-19 DIAGNOSIS — I5032 Chronic diastolic (congestive) heart failure: Secondary | ICD-10-CM | POA: Diagnosis not present

## 2022-04-19 DIAGNOSIS — I69351 Hemiplegia and hemiparesis following cerebral infarction affecting right dominant side: Secondary | ICD-10-CM | POA: Diagnosis not present

## 2022-04-25 DIAGNOSIS — I69351 Hemiplegia and hemiparesis following cerebral infarction affecting right dominant side: Secondary | ICD-10-CM | POA: Diagnosis not present

## 2022-04-25 DIAGNOSIS — I63312 Cerebral infarction due to thrombosis of left middle cerebral artery: Secondary | ICD-10-CM | POA: Diagnosis not present

## 2022-04-25 DIAGNOSIS — I5032 Chronic diastolic (congestive) heart failure: Secondary | ICD-10-CM | POA: Diagnosis not present

## 2022-04-26 DIAGNOSIS — I480 Paroxysmal atrial fibrillation: Secondary | ICD-10-CM | POA: Diagnosis not present

## 2022-04-26 DIAGNOSIS — I5032 Chronic diastolic (congestive) heart failure: Secondary | ICD-10-CM | POA: Diagnosis not present

## 2022-04-26 DIAGNOSIS — I69351 Hemiplegia and hemiparesis following cerebral infarction affecting right dominant side: Secondary | ICD-10-CM | POA: Diagnosis not present

## 2022-04-26 DIAGNOSIS — I63312 Cerebral infarction due to thrombosis of left middle cerebral artery: Secondary | ICD-10-CM | POA: Diagnosis not present

## 2022-04-26 DIAGNOSIS — I6602 Occlusion and stenosis of left middle cerebral artery: Secondary | ICD-10-CM | POA: Diagnosis not present

## 2022-04-29 DIAGNOSIS — E039 Hypothyroidism, unspecified: Secondary | ICD-10-CM | POA: Diagnosis not present

## 2022-04-29 LAB — TSH: TSH: 0.29 — AB (ref 0.41–5.90)

## 2022-04-30 DIAGNOSIS — I480 Paroxysmal atrial fibrillation: Secondary | ICD-10-CM | POA: Diagnosis not present

## 2022-04-30 DIAGNOSIS — I5032 Chronic diastolic (congestive) heart failure: Secondary | ICD-10-CM | POA: Diagnosis not present

## 2022-04-30 DIAGNOSIS — I63312 Cerebral infarction due to thrombosis of left middle cerebral artery: Secondary | ICD-10-CM | POA: Diagnosis not present

## 2022-04-30 DIAGNOSIS — I69351 Hemiplegia and hemiparesis following cerebral infarction affecting right dominant side: Secondary | ICD-10-CM | POA: Diagnosis not present

## 2022-04-30 DIAGNOSIS — I6602 Occlusion and stenosis of left middle cerebral artery: Secondary | ICD-10-CM | POA: Diagnosis not present

## 2022-05-01 DIAGNOSIS — I69351 Hemiplegia and hemiparesis following cerebral infarction affecting right dominant side: Secondary | ICD-10-CM | POA: Diagnosis not present

## 2022-05-01 DIAGNOSIS — I63312 Cerebral infarction due to thrombosis of left middle cerebral artery: Secondary | ICD-10-CM | POA: Diagnosis not present

## 2022-05-01 DIAGNOSIS — I5032 Chronic diastolic (congestive) heart failure: Secondary | ICD-10-CM | POA: Diagnosis not present

## 2022-05-03 DIAGNOSIS — I69351 Hemiplegia and hemiparesis following cerebral infarction affecting right dominant side: Secondary | ICD-10-CM | POA: Diagnosis not present

## 2022-05-03 DIAGNOSIS — I5032 Chronic diastolic (congestive) heart failure: Secondary | ICD-10-CM | POA: Diagnosis not present

## 2022-05-03 DIAGNOSIS — I480 Paroxysmal atrial fibrillation: Secondary | ICD-10-CM | POA: Diagnosis not present

## 2022-05-03 DIAGNOSIS — I63312 Cerebral infarction due to thrombosis of left middle cerebral artery: Secondary | ICD-10-CM | POA: Diagnosis not present

## 2022-05-03 DIAGNOSIS — I6602 Occlusion and stenosis of left middle cerebral artery: Secondary | ICD-10-CM | POA: Diagnosis not present

## 2022-05-06 DIAGNOSIS — I63312 Cerebral infarction due to thrombosis of left middle cerebral artery: Secondary | ICD-10-CM | POA: Diagnosis not present

## 2022-05-06 DIAGNOSIS — I5032 Chronic diastolic (congestive) heart failure: Secondary | ICD-10-CM | POA: Diagnosis not present

## 2022-05-06 DIAGNOSIS — I69351 Hemiplegia and hemiparesis following cerebral infarction affecting right dominant side: Secondary | ICD-10-CM | POA: Diagnosis not present

## 2022-05-07 ENCOUNTER — Non-Acute Institutional Stay (SKILLED_NURSING_FACILITY): Payer: Medicare Other | Admitting: Orthopedic Surgery

## 2022-05-07 ENCOUNTER — Encounter: Payer: Self-pay | Admitting: Orthopedic Surgery

## 2022-05-07 DIAGNOSIS — I5032 Chronic diastolic (congestive) heart failure: Secondary | ICD-10-CM

## 2022-05-07 DIAGNOSIS — I10 Essential (primary) hypertension: Secondary | ICD-10-CM | POA: Diagnosis not present

## 2022-05-07 DIAGNOSIS — I2510 Atherosclerotic heart disease of native coronary artery without angina pectoris: Secondary | ICD-10-CM

## 2022-05-07 DIAGNOSIS — G8191 Hemiplegia, unspecified affecting right dominant side: Secondary | ICD-10-CM

## 2022-05-07 DIAGNOSIS — K219 Gastro-esophageal reflux disease without esophagitis: Secondary | ICD-10-CM | POA: Diagnosis not present

## 2022-05-07 DIAGNOSIS — J439 Emphysema, unspecified: Secondary | ICD-10-CM | POA: Diagnosis not present

## 2022-05-07 DIAGNOSIS — I480 Paroxysmal atrial fibrillation: Secondary | ICD-10-CM

## 2022-05-07 DIAGNOSIS — H9313 Tinnitus, bilateral: Secondary | ICD-10-CM | POA: Diagnosis not present

## 2022-05-07 DIAGNOSIS — F331 Major depressive disorder, recurrent, moderate: Secondary | ICD-10-CM

## 2022-05-07 DIAGNOSIS — F5101 Primary insomnia: Secondary | ICD-10-CM | POA: Diagnosis not present

## 2022-05-07 DIAGNOSIS — E782 Mixed hyperlipidemia: Secondary | ICD-10-CM

## 2022-05-07 NOTE — Progress Notes (Unsigned)
Location:  [facility] Room Number: 116 Place of Service:  SNF ([phone]) Provider: Hazle Nordmann, NP   Patient Care Team: [name], MD as PCP - General (Internal Medicine) [name], MD as PCP - Cardiology (Cardiology) Duke Salvia, MD as PCP - Electrophysiology (Cardiology) Shirlean Kelly, MD as Consulting Physician (Neurosurgery)  Extended Emergency Contact Information Primary Emergency Contact: Pascale,[name] Address: [address]          [place] [phone] [name] Home Phone: [phone] Mobile Phone: [phone] Relation: Spouse Secondary Emergency Contact: Arrey,[name] Address: [address]          [place], [place] [address] [name] Mobile Phone: [phone] Relation: Son  Code Status:  Full Code Goals of care: Advanced Directive information    05/07/2022   12:00 PM  Advanced Directives  Does Patient Have a Medical Advance Directive? Yes  Type of Advance Directive Living will  Does patient want to make changes to medical advance directive? No - Patient declined  Copy of Healthcare Power of Attorney in Chart? No - copy requested     Chief Complaint  Patient presents with   Medical Management of Chronic Issues    Routine Visit    HPI:  Pt is a 77 y.o. female seen today for medical management of chronic conditions.   She currently resides on the skilled nursing unit at [facility]. 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, lives in skilled nursing, working with PT, able to stand and pivot with 1 person assistance, receives Botox injections every 3 months Depression- no mood changes, remains on Wellbutrin Tinnitus/ Earwax- described as "muffled noise," ears were impacted with wax, completed 1 round of debrox and flushing, no symptom improvement, scheduled to see ENT GERD- hgb 13.3 (12/04), remains on Protonix Insomnia- remains on Ambien prn      Past Medical History:  Diagnosis Date   Anxiety    Arthritis    "some in my lower back; probably elbows, knees" (11/18/2017)   Atrial fibrillation (HCC)    Bell's palsy    when pt. was 77 yrs old, when under stress the left side of face will droop.   Complication of anesthesia    "vascular OR 2016; BP bottomed out; couldn't get it regulated; ended up in ICU for DAYS" (11/18/2017)   GERD (gastroesophageal reflux disease)    History of kidney stones    Hypertension    Hypertrophic cardiomyopathy (HCC)    severe LV basilar hypertrophy witn no evidence of significant outflow tract obstruction, EF 65-70%, mild LAE, mild TR, grade 1a diastolic dysfunction 05/15/10 (Dr. Donato Schultz) (Atrial Septal Hypertrophy pattern)-- Intra-op TEE with dsignificant outflow tract obstruction - AI, MR & TR   Insomnia    Mild aortic sclerosis    Osteopenia    Peripheral vascular disease (HCC)    Syncope    , Vagal  Past Surgical History:  Procedure Laterality Date   AUGMENTATION MAMMAPLASTY Bilateral    BACK SURGERY     CARDIAC CATHETERIZATION N/A 05/07/2016   Procedure: Left Heart Cath and Coronary Angiography;  Surgeon: Kathleene Hazel, MD;  Location: [facility] INVASIVE CV LAB;  Service: Cardiovascular;  Laterality: N/A;   CARDIOVERSION N/A 09/24/2017   Procedure: CARDIOVERSION;  Surgeon: Laurey Morale, MD;  Location:  [facility] ENDOSCOPY;  Service: Cardiovascular;  Laterality: N/A;   DILATION AND CURETTAGE OF UTERUS     ENDARTERECTOMY FEMORAL Right 03/02/2015   Procedure: ENDARTERECTOMY RIGHT FEMORAL;  Surgeon: Chuck Hint, MD;  Location: [facility] OR;  Service: Vascular;  Laterality: Right;   ESOPHAGOGASTRODUODENOSCOPY (EGD) WITH PROPOFOL N/A 12/01/2020   Procedure: ESOPHAGOGASTRODUODENOSCOPY (EGD) WITH PROPOFOL;  Surgeon: Diamantina Monks, MD;  Location: [facility] ENDOSCOPY;  Service: General;  Laterality: N/A;   FACIAL COSMETIC SURGERY Left 2002   "related to Bell's Palsy @ age 77; left eye/side of face droopy; tried to make area symmetrical"   FEMORAL-POPLITEAL BYPASS GRAFT Right 03/02/2015   Procedure: BYPASS GRAFT FEMORAL-BELOW KNEE POPLITEAL ARTERY;  Surgeon: Chuck Hint, MD;  Location: [facility] OR;  Service: Vascular;  Laterality: Right;   INGUINAL HERNIA REPAIR Bilateral 2002   IR ANGIO INTRA EXTRACRAN SEL COM CAROTID INNOMINATE UNI L MOD SED  11/16/2020   IR CT HEAD LTD  11/13/2020   IR PERCUTANEOUS ART THROMBECTOMY/INFUSION INTRACRANIAL INC DIAG ANGIO  11/13/2020   OVARIAN CYST REMOVAL Left    PEG PLACEMENT N/A 12/01/2020   Procedure: PERCUTANEOUS ENDOSCOPIC GASTROSTOMY (PEG) PLACEMENT;  Surgeon: Diamantina Monks, MD;  Location: [facility] ENDOSCOPY;  Service: General;  Laterality: N/A;   PERIPHERAL VASCULAR CATHETERIZATION N/A 01/16/2015   Procedure: Abdominal Aortogram;  Surgeon: Chuck Hint, MD;  Location: [facility] INVASIVE CV LAB;  Service: Cardiovascular;  Laterality: N/A;   POSTERIOR LUMBAR FUSION  2015   "have plates and screws in there"   RADIOLOGY WITH ANESTHESIA N/A 11/13/2020   Procedure: IR WITH ANESTHESIA;  Surgeon: Radiologist, Medication, MD;  Location: [facility] OR;  Service: Radiology;  Laterality: N/A;   TONSILLECTOMY      Allergies  Allergen Reactions   Amoxicillin Other (See Comments)    UTI Has patient had a PCN reaction causing immediate rash, facial/tongue/throat swelling, SOB or lightheadedness  with hypotension: No Has patient had a PCN reaction causing severe rash involving mucus membranes or skin necrosis: No Has patient had a PCN reaction that required hospitalization: No Has patient had a PCN reaction occurring within the last 10 years: Yes--UTI ONLY If all of the above answers are "NO", then may proceed with Cephalosporin use.    Atenolol Cough   Crestor [Rosuvastatin Calcium] Other (See Comments)    Muscle aches   Pravastatin Other (See Comments)    Muscle aches   Rosuvastatin Calcium    Sulfa Antibiotics Nausea Only   Codeine Other (See Comments)    hallucinations    Outpatient Encounter Medications as of 05/07/2022  Medication Sig   aluminum-magnesium hydroxide-simethicone (MAALOX) 200-200-20 MG/5ML SUSP Take 30 mLs by mouth every 4 (four) hours as needed (heartburn).   amiodarone (PACERONE) 200 MG tablet Take 200 mg by mouth daily. May give crushed   amLODipine (NORVASC) 5 MG tablet Take 5 mg by mouth daily.   apixaban (ELIQUIS) 5 MG TABS tablet Take 5 mg by mouth 2 (two) times daily. May give crushed   bisacodyl (DULCOLAX) 5 MG EC tablet Take 5 mg by mouth 3 (three) times daily as  needed for moderate constipation.   botulinum toxin Type A (BOTOX) 100 units SOLR injection Inject 300 units into the muscles every 3 months.   buPROPion (WELLBUTRIN XL) 150 MG 24 hr tablet Take 150 mg by mouth daily.   carbamide peroxide (DEBROX) 6.5 % OTIC solution 5 drops 2 (two) times daily.   diclofenac Sodium (VOLTAREN) 1 % GEL Apply one application to right hip and knee four times daily as needed.   ezetimibe (ZETIA) 10 MG tablet Take 10 mg by mouth daily. Oral if crushed   furosemide (LASIX) 20 MG tablet Take 20 mg by mouth daily as needed. Give Lasix 20mg  for weight gain equal or greater than 3 pounds   hydrALAZINE (APRESOLINE) 10 MG tablet Take 10 mg by mouth 3 (three) times daily as needed (SBP > 165 or DBP > 95).   ipratropium-albuterol (DUONEB) 0.5-2.5 (3) MG/3ML SOLN Inhale 3  mLs into the lungs every 6 (six) hours as needed.   ketoconazole (NIZORAL) 2 % cream Apply 1 application  topically daily as needed for irritation.   nystatin cream (MYCOSTATIN) Apply 1 application topically 2 (two) times daily as needed for dry skin (apply to perirectal rash).   ondansetron (ZOFRAN) 4 MG tablet Take 4 mg by mouth every 6 (six) hours as needed for nausea or vomiting.   OXYGEN Inhale 3 L into the lungs as directed. to maintain sats > or equal to 90%. May titrate Three Times A Day; 07:00 AM - 03:00 PM, 03:00 PM - 11:00 PM, 11:00 PM - 7:00 am   pantoprazole (PROTONIX) 40 MG tablet Take 40 mg by mouth 2 (two) times daily.   Polyethyl Glycol-Propyl Glycol (SYSTANE) 0.4-0.3 % SOLN Place 2 drops into both eyes 4 (four) times daily as needed (irritation).   polyethylene glycol powder (GLYCOLAX/MIRALAX) 17 GM/SCOOP powder Take 17 g by mouth daily.   promethazine (PHENERGAN) 12.5 MG suppository Place 12.5 mg rectally every 6 (six) hours as needed for nausea or vomiting.   Tiotropium Bromide-Olodaterol (STIOLTO RESPIMAT) 2.5-2.5 MCG/ACT AERS Inhale 2 puffs into the lungs daily.   zolpidem (AMBIEN) 5 MG tablet Take 0.5 tablets (2.5 mg total) by mouth at bedtime as needed for sleep.   [DISCONTINUED] zinc oxide 20 % ointment Apply 1 application. topically 2 (two) times daily as needed for irritation (apply to perirectal area prn - use with nystatin). (Patient not taking: Reported on 05/07/2022)   Facility-Administered Encounter Medications as of 05/07/2022  Medication   triamcinolone acetonide (KENALOG) 10 MG/ML injection 10 mg    Review of Systems  Constitutional:  Negative for activity change, appetite change, fatigue and fever.  HENT:  Positive for tinnitus. Negative for congestion and trouble swallowing.   Eyes:  Negative for visual disturbance.  Respiratory:  Negative for cough, shortness of breath and wheezing.   Cardiovascular:  Negative for chest pain and leg swelling.   Gastrointestinal:  Positive for constipation. Negative for abdominal distention, abdominal pain, diarrhea, nausea and vomiting.  Genitourinary:  Negative for dysuria, frequency and vaginal bleeding.  Musculoskeletal:  Positive for gait problem.  Skin:  Negative for wound.  Neurological:  Positive for weakness. Negative for dizziness and headaches.  Psychiatric/Behavioral:  Positive for dysphoric mood and sleep disturbance. Negative for confusion. The patient is not nervous/anxious.     Immunization History  Administered Date(s) Administered   Fluad Quad(high Dose 65+) 02/05/2022   Influenza Split 01/16/2009, 04/30/2010, 01/26/2016, 02/07/2018, 02/01/2020   Influenza, High Dose Seasonal PF 02/07/2018, 01/25/2019, 02/28/2021   Influenza,inj,Quad  PF,6+ Mos 03/06/2012, 01/26/2016, 01/10/2017   Influenza,inj,quad, With Preservative 02/14/2015   Moderna Covid-19 Vaccine Bivalent Booster 77yrs & up 02/28/2021   PFIZER(Purple Top)SARS-COV-2 Vaccination 05/06/2019, 05/27/2019, 01/27/2020, 09/18/2020   Pneumococcal Conjugate-13 03/19/2017   Pneumococcal Polysaccharide-23 02/14/2015   Td 02/25/2005   Tdap 05/19/2013   Zoster, Live 07/28/2006   Pertinent  Health Maintenance Due  Topic Date Due   INFLUENZA VACCINE  Completed   DEXA SCAN  Discontinued      07/07/2021    7:20 PM 07/08/2021    9:00 AM 07/08/2021    9:30 PM 07/09/2021    8:00 AM 05/07/2022   11:59 AM  Fall Risk  Falls in the past year?     0  Was there an injury with Fall?     0  Fall Risk Category Calculator     0  (RETIRED) Patient Fall Risk Level High fall risk High fall risk High fall risk Moderate fall risk   Patient at Risk for Falls Due to     No Fall Risks  Fall risk Follow up     Falls evaluation completed   Functional Status Survey:    Vitals:   05/07/22 1152  BP: 92/65  Pulse: (!) 52  Resp: 16  Temp: (!) 97.3 F (36.3 C)  SpO2: 94%  Weight: 97 lb 2 oz (44.1 kg)  Height: 5\' 3"  (1.6 m)   Body mass index  is 17.2 kg/m. Physical Exam Vitals reviewed.  Constitutional:      General: She is not in acute distress. HENT:     Head: Normocephalic.     Right Ear: There is no impacted cerumen.     Left Ear: There is impacted cerumen.     Nose: Nose normal.     Mouth/Throat:     Mouth: Mucous membranes are moist.  Eyes:     General:        Right eye: No discharge.        Left eye: No discharge.  Cardiovascular:     Rate and Rhythm: Normal rate. Rhythm irregular.     Pulses: Normal pulses.     Heart sounds: Normal heart sounds.  Pulmonary:     Effort: Pulmonary effort is normal. No respiratory distress.     Breath sounds: Normal breath sounds. No wheezing.  Abdominal:     General: Bowel sounds are normal. There is no distension.     Palpations: Abdomen is soft.     Tenderness: There is no abdominal tenderness.  Musculoskeletal:     Cervical back: Neck supple.     Right lower leg: No edema.     Left lower leg: No edema.  Skin:    General: Skin is warm and dry.     Capillary Refill: Capillary refill takes less than 2 seconds.  Neurological:     General: No focal deficit present.     Mental Status: She is alert and oriented to person, place, and time.     Motor: Weakness present.     Gait: Gait abnormal.     Comments: Right sided weakness, right hand contracted, speech slightly slurred  Psychiatric:        Mood and Affect: Mood normal.        Behavior: Behavior normal.     Labs reviewed: Recent Labs    07/06/21 1605 07/06/21 1612 07/07/21 0800 07/08/21 0717 07/09/21 0317 07/11/21 0000 09/18/21 0000 12/03/21 0000 03/18/22 0500  NA 138   < > 140  141 141   < > 135* 139 140  K 4.4   < > 4.3 4.0 3.8   < > 5.1 4.4 4.3  CL 104   < > 107 105 109   < > 101 104 106  CO2 30  --  27 28 29    < > 27* 27* 26*  GLUCOSE 92   < > 80 87 90  --   --   --   --   BUN 30*   < > 26* 22 21   < > 32* 28* 31*  CREATININE 0.98   < > 0.79 0.71 0.73   < > 0.9 0.9 0.9  CALCIUM 8.8*  --  8.7* 9.0  8.7*   < > 8.8 9.0 8.9  MG 2.1  --   --  1.9 1.9  --   --   --   --    < > = values in this interval not displayed.   Recent Labs    07/06/21 1605 12/03/21 0000 12/10/21 0000 03/18/22 0500  AST 33 38* 32 33  ALT 45* 48* 38* 39*  ALKPHOS 76 122 122 89  BILITOT 0.7  --   --   --   PROT 6.2*  --   --   --   ALBUMIN 3.2* 3.4* 3.5 3.4*   Recent Labs    07/06/21 1605 07/06/21 1612 07/08/21 0717 07/09/21 0317 07/11/21 0000 03/18/22 0500  WBC 9.3  --  9.8 9.5 8.3 5.9  NEUTROABS 6.5  --   --   --   --   --   HGB 14.6   < > 14.9 14.3 14.1 13.3  HCT 45.2   < > 45.4 43.7 42 41  MCV 93.0  --  91.2 90.7  --   --   PLT 241  --  235 237 224 224   < > = values in this interval not displayed.   Lab Results  Component Value Date   TSH 0.29 (A) 04/29/2022   Lab Results  Component Value Date   HGBA1C 6.0 (H) 11/13/2020   Lab Results  Component Value Date   CHOL 182 11/22/2021   HDL 80 (A) 11/22/2021   LDLCALC 89 11/22/2021   TRIG 64 11/22/2021   CHOLHDL 3.0 11/13/2020    Significant Diagnostic Results in last 30 days:  No results found.  Assessment/Plan 1. Essential hypertension - controlled - cont amlodipine  2. Mixed hyperlipidemia - LDL stable - statin intolerance - cont Zetia  3. PAF (paroxysmal atrial fibrillation) (HCC) - HR< 100 with amlodipine - cont Eliquis for clot prevention  4. Pulmonary emphysema, unspecified emphysema type (HCC) - followed by pulmonary - cont Respimat and duonebs   5. Chronic diastolic congestive heart failure (HCC) - compensated - cont furosemide prn  6. Coronary artery disease involving native coronary artery of native heart without angina pectoris - cont Zetia and Eliquis  7. 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 Moderate episode of recurrent major depressive disorder (HCC) -  no mood changes - cont Wellbutrin  9. Tinnitus of both ears - ongoing - completed Debrox and ear flush without improvement - left ear with cerumen> repeat [facility] Debrox protocol - plans to see ENT  10. Gastroesophageal reflux disease without esophagitis - hgb stable - cont Protonix  11.  Primary insomnia - cont Ambien prn    Family/ staff Communication: plan discussed with patient and nurse  Labs/tests ordered:  none

## 2022-05-08 DIAGNOSIS — I5032 Chronic diastolic (congestive) heart failure: Secondary | ICD-10-CM | POA: Diagnosis not present

## 2022-05-08 DIAGNOSIS — M6389 Disorders of muscle in diseases classified elsewhere, multiple sites: Secondary | ICD-10-CM | POA: Diagnosis not present

## 2022-05-08 DIAGNOSIS — I69351 Hemiplegia and hemiparesis following cerebral infarction affecting right dominant side: Secondary | ICD-10-CM | POA: Diagnosis not present

## 2022-05-08 DIAGNOSIS — I480 Paroxysmal atrial fibrillation: Secondary | ICD-10-CM | POA: Diagnosis not present

## 2022-05-08 DIAGNOSIS — R293 Abnormal posture: Secondary | ICD-10-CM | POA: Diagnosis not present

## 2022-05-08 DIAGNOSIS — R471 Dysarthria and anarthria: Secondary | ICD-10-CM | POA: Diagnosis not present

## 2022-05-08 DIAGNOSIS — R278 Other lack of coordination: Secondary | ICD-10-CM | POA: Diagnosis not present

## 2022-05-08 DIAGNOSIS — I63312 Cerebral infarction due to thrombosis of left middle cerebral artery: Secondary | ICD-10-CM | POA: Diagnosis not present

## 2022-05-08 DIAGNOSIS — I6602 Occlusion and stenosis of left middle cerebral artery: Secondary | ICD-10-CM | POA: Diagnosis not present

## 2022-05-08 DIAGNOSIS — R482 Apraxia: Secondary | ICD-10-CM | POA: Diagnosis not present

## 2022-05-09 DIAGNOSIS — I6602 Occlusion and stenosis of left middle cerebral artery: Secondary | ICD-10-CM | POA: Diagnosis not present

## 2022-05-09 DIAGNOSIS — I480 Paroxysmal atrial fibrillation: Secondary | ICD-10-CM | POA: Diagnosis not present

## 2022-05-09 DIAGNOSIS — I5032 Chronic diastolic (congestive) heart failure: Secondary | ICD-10-CM | POA: Diagnosis not present

## 2022-05-10 DIAGNOSIS — R278 Other lack of coordination: Secondary | ICD-10-CM | POA: Diagnosis not present

## 2022-05-10 DIAGNOSIS — R482 Apraxia: Secondary | ICD-10-CM | POA: Diagnosis not present

## 2022-05-10 DIAGNOSIS — R471 Dysarthria and anarthria: Secondary | ICD-10-CM | POA: Diagnosis not present

## 2022-05-10 DIAGNOSIS — I5032 Chronic diastolic (congestive) heart failure: Secondary | ICD-10-CM | POA: Diagnosis not present

## 2022-05-10 DIAGNOSIS — R293 Abnormal posture: Secondary | ICD-10-CM | POA: Diagnosis not present

## 2022-05-10 DIAGNOSIS — M6389 Disorders of muscle in diseases classified elsewhere, multiple sites: Secondary | ICD-10-CM | POA: Diagnosis not present

## 2022-05-10 DIAGNOSIS — I63312 Cerebral infarction due to thrombosis of left middle cerebral artery: Secondary | ICD-10-CM | POA: Diagnosis not present

## 2022-05-10 DIAGNOSIS — I69351 Hemiplegia and hemiparesis following cerebral infarction affecting right dominant side: Secondary | ICD-10-CM | POA: Diagnosis not present

## 2022-05-13 DIAGNOSIS — I5032 Chronic diastolic (congestive) heart failure: Secondary | ICD-10-CM | POA: Diagnosis not present

## 2022-05-13 DIAGNOSIS — I69351 Hemiplegia and hemiparesis following cerebral infarction affecting right dominant side: Secondary | ICD-10-CM | POA: Diagnosis not present

## 2022-05-13 DIAGNOSIS — I63312 Cerebral infarction due to thrombosis of left middle cerebral artery: Secondary | ICD-10-CM | POA: Diagnosis not present

## 2022-05-14 ENCOUNTER — Other Ambulatory Visit (HOSPITAL_COMMUNITY): Payer: Self-pay

## 2022-05-14 NOTE — Telephone Encounter (Signed)
FYI- Spoke with Beverly Li SP to discuss Botox shipment, I was informed that pt's husband had hung up on them when they tried to complete onboarding process with him. I gave him a call and told him this needed to be completed before pt could come for her appointment or else we won't have the medication here. Provided him with phone number and he stated he would call.

## 2022-05-15 DIAGNOSIS — I63312 Cerebral infarction due to thrombosis of left middle cerebral artery: Secondary | ICD-10-CM | POA: Diagnosis not present

## 2022-05-15 DIAGNOSIS — I480 Paroxysmal atrial fibrillation: Secondary | ICD-10-CM | POA: Diagnosis not present

## 2022-05-15 DIAGNOSIS — I69351 Hemiplegia and hemiparesis following cerebral infarction affecting right dominant side: Secondary | ICD-10-CM | POA: Diagnosis not present

## 2022-05-15 DIAGNOSIS — I5032 Chronic diastolic (congestive) heart failure: Secondary | ICD-10-CM | POA: Diagnosis not present

## 2022-05-15 DIAGNOSIS — I6602 Occlusion and stenosis of left middle cerebral artery: Secondary | ICD-10-CM | POA: Diagnosis not present

## 2022-05-17 DIAGNOSIS — I6602 Occlusion and stenosis of left middle cerebral artery: Secondary | ICD-10-CM | POA: Diagnosis not present

## 2022-05-17 DIAGNOSIS — R482 Apraxia: Secondary | ICD-10-CM | POA: Diagnosis not present

## 2022-05-17 DIAGNOSIS — M6389 Disorders of muscle in diseases classified elsewhere, multiple sites: Secondary | ICD-10-CM | POA: Diagnosis not present

## 2022-05-17 DIAGNOSIS — I69351 Hemiplegia and hemiparesis following cerebral infarction affecting right dominant side: Secondary | ICD-10-CM | POA: Diagnosis not present

## 2022-05-17 DIAGNOSIS — R471 Dysarthria and anarthria: Secondary | ICD-10-CM | POA: Diagnosis not present

## 2022-05-17 DIAGNOSIS — I63312 Cerebral infarction due to thrombosis of left middle cerebral artery: Secondary | ICD-10-CM | POA: Diagnosis not present

## 2022-05-17 DIAGNOSIS — R278 Other lack of coordination: Secondary | ICD-10-CM | POA: Diagnosis not present

## 2022-05-17 DIAGNOSIS — I480 Paroxysmal atrial fibrillation: Secondary | ICD-10-CM | POA: Diagnosis not present

## 2022-05-17 DIAGNOSIS — R293 Abnormal posture: Secondary | ICD-10-CM | POA: Diagnosis not present

## 2022-05-17 DIAGNOSIS — I5032 Chronic diastolic (congestive) heart failure: Secondary | ICD-10-CM | POA: Diagnosis not present

## 2022-05-20 ENCOUNTER — Other Ambulatory Visit: Payer: Self-pay

## 2022-05-20 ENCOUNTER — Ambulatory Visit: Payer: Medicare Other | Admitting: Podiatry

## 2022-05-21 ENCOUNTER — Other Ambulatory Visit: Payer: Self-pay

## 2022-05-22 ENCOUNTER — Ambulatory Visit: Payer: Medicare Other | Admitting: Neurology

## 2022-05-22 VITALS — BP 97/58 | HR 48 | Ht 63.0 in

## 2022-05-22 DIAGNOSIS — G8191 Hemiplegia, unspecified affecting right dominant side: Secondary | ICD-10-CM

## 2022-05-22 DIAGNOSIS — I69351 Hemiplegia and hemiparesis following cerebral infarction affecting right dominant side: Secondary | ICD-10-CM | POA: Diagnosis not present

## 2022-05-22 DIAGNOSIS — G8111 Spastic hemiplegia affecting right dominant side: Secondary | ICD-10-CM | POA: Diagnosis not present

## 2022-05-22 DIAGNOSIS — I63312 Cerebral infarction due to thrombosis of left middle cerebral artery: Secondary | ICD-10-CM | POA: Diagnosis not present

## 2022-05-22 DIAGNOSIS — I5032 Chronic diastolic (congestive) heart failure: Secondary | ICD-10-CM | POA: Diagnosis not present

## 2022-05-22 MED ORDER — ONABOTULINUMTOXINA 100 UNITS IJ SOLR: 100.0000 [IU] | Freq: Once | INTRAMUSCULAR | Status: DC

## 2022-05-22 NOTE — Progress Notes (Signed)
Chief Complaint  Patient presents with   Procedure    Rm 12.       ASSESSMENT AND PLAN  Beverly Li is a 77 y.o. 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  Stroke happened while she was off Eliquis for 1 week for abdominal hematoma, with history of atrial fibrillation  Now with residual aphasia, right hemiparesis,  She only has mild spasticity of right upper extremity, discussed with patient and her husband, both agreed to proceed with botulism toxin injection Electrical stimulation guided Botox a injection for spastic right upper extremity (100 units of Botox a dissolving to 2 cc of normal saline)  Right pectoralis major 25 units Right latissimus dorsi 25 units Right flexor digitorum profundus 50 units Right pronator teres 50 units Right brachialis 25 units Right palmaris longus 25 units   DIAGNOSTIC DATA (LABS, IMAGING, TESTING) - I reviewed patient records, labs, notes, testing and imaging myself where available.   MEDICAL HISTORY:  Beverly Li is a 77 year old female, accompanied by her husband, seen in request by his primary care at [facility] Dr. Veleta Miners for evaluation of botulism toxin injection for spastic right hemiparesis, she is also seen by our clinic for stroke follow-up   I reviewed and summarized the referring note.PMHx. A fib,  Eliquis HTN Peripheral vascular disease. COPD   She suffered stroke on November 13, 2020 presenting with right-sided weakness, right facial droop, left gaze preference,  MRI showed left ACA/MCA stroke, status post IR with TICI, reperfusion of left MCA, in the setting of paroxysmal atrial fibrillation taking off Eliquis for 1 week due to abdominal hematoma  CT angiogram head and neck and four-vessel angiogram postprocedure showed status post revascularization of the occluded left middle cerebral artery M1 segment, and proximal flow arrest achieved TICI 3 revascularization, severe preocclusive stenosis of the  proximal right internal carotid artery associated with a delayed strain signs,  Known history of peripheral vascular disease, CT angiogram November 25, 2020 suspicious for occlusion of distal right femoral-popliteal bypass, low cardiac output, and distal high-grade stenosis/resistance also contributed to the unopacified distal bypass conduit, Absent of right side tibial arterial opacification, moderate to advanced aortic atherosclerosis bilateral iliac arterial disease, with no evidence of high-grade aortoiliac disease.  Echocardiogram in March 2023, ejection fraction 60 to 65%, no regional wall motion abnormality, moderate asymmetric left ventricular hypertrophy of the basal septal segment  She is now at [facility], with right hemiparesis, aphasia, husband reported that she continued to make small progress even now, physical therapy have suggested botulism toxin injection to alleviate her right shoulder and arm weakness   UPDATE May 22 2022: She is accompanied by her husband, receiving first electrical stimulation guided Botox injection for spastic right upper extremity, use of Botox a 100 units x 2, potential side effect explained,  PHYSICAL EXAM:   Vitals:   05/22/22 1559  BP: (!) 97/58  Pulse: (!) 48  Height: 5' 3"$  (1.6 m)   Not recorded     Body mass index is 17.2 kg/m.  PHYSICAL EXAMNIATION:  Gen: NAD, conversant, well nourised, well groomed                     Cardiovascular: Regular rate rhythm, no peripheral edema, warm, nontender. Eyes: Conjunctivae clear without exudates or hemorrhage Neck: Supple, no carotid bruits. Pulmonary: Clear to auscultation bilaterally   NEUROLOGICAL EXAM:  MENTAL STATUS: Speech/cognition: Global aphasia, paraphasic errors, slow in comprehension, word finding  difficulties CRANIAL NERVES: CN II: Visual fields are full to confrontation. Pupils are round equal and briskly reactive to light. CN III, IV, VI: extraocular movement are  normal. No ptosis. CN V: Facial sensation is intact to light touch CN VII: Face is symmetric with normal eye closure  CN VIII: Hearing is normal to causal conversation. CN IX, X: Phonation is normal. CN XI: Head turning and shoulder shrug are intact  MOTOR: Right hemiparesis, mild spasticity of right upper extremity, no significant right upper extremity movement, good range of motion of right shoulder, elbow, tendency for right thumb in position,  No antigravity movement of right lower extremity, mild spasticity, mild limited range of motion of right knee,  REFLEXES: Hyperreflexia right upper and lower extremity  SENSORY: Intact to light touch, pinprick and vibratory sensation are intact in fingers and toes.  COORDINATION: There is no trunk or limb dysmetria noted.  GAIT/STANCE: Deferred  REVIEW OF SYSTEMS:  Full 14 system review of systems performed and notable only for as above All other review of systems were negative.   ALLERGIES: Allergies  Allergen Reactions   Amoxicillin Other (See Comments)    UTI Has patient had a PCN reaction causing immediate rash, facial/tongue/throat swelling, SOB or lightheadedness with hypotension: No Has patient had a PCN reaction causing severe rash involving mucus membranes or skin necrosis: No Has patient had a PCN reaction that required hospitalization: No Has patient had a PCN reaction occurring within the last 10 years: Yes--UTI ONLY If all of the above answers are "NO", then may proceed with Cephalosporin use.    Atenolol Cough   Crestor [Rosuvastatin Calcium] Other (See Comments)    Muscle aches   Pravastatin Other (See Comments)    Muscle aches   Rosuvastatin Calcium    Sulfa Antibiotics Nausea Only   Codeine Other (See Comments)    hallucinations    HOME MEDICATIONS: Current Outpatient Medications  Medication Sig Dispense Refill   aluminum-magnesium hydroxide-simethicone (MAALOX) [id number] MG/5ML SUSP Take 30 mLs by mouth  every 4 (four) hours as needed (heartburn).     amiodarone (PACERONE) 200 MG tablet Take 200 mg by mouth daily. May give crushed     amLODipine (NORVASC) 5 MG tablet Take 5 mg by mouth daily.     apixaban (ELIQUIS) 5 MG TABS tablet Take 5 mg by mouth 2 (two) times daily. May give crushed     bisacodyl (DULCOLAX) 5 MG EC tablet Take 5 mg by mouth 3 (three) times daily as needed for moderate constipation.     botulinum toxin Type A (BOTOX) 100 units SOLR injection Inject 300 units into the muscles every 3 months. 3 each 3   buPROPion (WELLBUTRIN XL) 150 MG 24 hr tablet Take 150 mg by mouth daily.     carbamide peroxide (DEBROX) 6.5 % OTIC solution 5 drops 2 (two) times daily.     diclofenac Sodium (VOLTAREN) 1 % GEL Apply one application to right hip and knee four times daily as needed.     ezetimibe (ZETIA) 10 MG tablet Take 10 mg by mouth daily. Oral if crushed     furosemide (LASIX) 20 MG tablet Take 20 mg by mouth daily as needed. Give Lasix 48m for weight gain equal or greater than 3 pounds     hydrALAZINE (APRESOLINE) 10 MG tablet Take 10 mg by mouth 3 (three) times daily as needed (SBP > 165 or DBP > 95).     ipratropium-albuterol (DUONEB) 0.5-2.5 (3) MG/3ML SOLN Inhale  3 mLs into the lungs every 6 (six) hours as needed.     ketoconazole (NIZORAL) 2 % cream Apply 1 application  topically daily as needed for irritation.     nystatin cream (MYCOSTATIN) Apply 1 application topically 2 (two) times daily as needed for dry skin (apply to perirectal rash).     ondansetron (ZOFRAN) 4 MG tablet Take 4 mg by mouth every 6 (six) hours as needed for nausea or vomiting.     OXYGEN Inhale 3 L into the lungs as directed. to maintain sats > or equal to 90%. May titrate Three Times A Day; 07:00 AM - 03:00 PM, 03:00 PM - 11:00 PM, 11:00 PM - 7:00 am     pantoprazole (PROTONIX) 40 MG tablet Take 40 mg by mouth 2 (two) times daily.     Polyethyl Glycol-Propyl Glycol (SYSTANE) 0.4-0.3 % SOLN Place 2 drops into  both eyes 4 (four) times daily as needed (irritation).     polyethylene glycol powder (GLYCOLAX/MIRALAX) 17 GM/SCOOP powder Take 17 g by mouth daily.     promethazine (PHENERGAN) 12.5 MG suppository Place 12.5 mg rectally every 6 (six) hours as needed for nausea or vomiting.     Tiotropium Bromide-Olodaterol (STIOLTO RESPIMAT) 2.5-2.5 MCG/ACT AERS Inhale 2 puffs into the lungs daily.     zolpidem (AMBIEN) 5 MG tablet Take 0.5 tablets (2.5 mg total) by mouth at bedtime as needed for sleep. 30 tablet 1   Current Facility-Administered Medications  Medication Dose Route Frequency Provider Last Rate Last Admin   botulinum toxin Type A (BOTOX) injection 100 Units  100 Units Intramuscular Once Marcial Pacas, MD       triamcinolone acetonide (KENALOG) 10 MG/ML injection 10 mg  10 mg Other Once Wallene Huh, DPM        PAST MEDICAL HISTORY: Past Medical History:  Diagnosis Date   Anxiety    Arthritis    "some in my lower back; probably elbows, knees" (11/18/2017)   Atrial fibrillation (HCC)    Bell's palsy    when pt. was 77 yrs old, when under stress the left side of face will droop.   Complication of anesthesia    "vascular OR 2016; BP bottomed out; couldn't get it regulated; ended up in ICU for DAYS" (11/18/2017)   GERD (gastroesophageal reflux disease)    History of kidney stones    Hypertension    Hypertrophic cardiomyopathy (HCC)    severe LV basilar hypertrophy witn no evidence of significant outflow tract obstruction, EF 65-70%, mild LAE, mild TR, grade 1a diastolic dysfunction [id number] (Dr. Candee Furbish) (Atrial Septal Hypertrophy pattern)-- Intra-op TEE with dsignificant outflow tract obstruction - AI, MR & TR   Insomnia    Mild aortic sclerosis    Osteopenia    Peripheral vascular disease (HCC)    Syncope    , Vagal    PAST SURGICAL HISTORY: Past Surgical History:  Procedure Laterality Date   AUGMENTATION MAMMAPLASTY Bilateral    BACK SURGERY     CARDIAC CATHETERIZATION N/A  05/07/2016   Procedure: Left Heart Cath and Coronary Angiography;  Surgeon: Burnell Blanks, MD;  Location: [facility];  Service: Cardiovascular;  Laterality: N/A;   CARDIOVERSION N/A 09/24/2017   Procedure: CARDIOVERSION;  Surgeon: Larey Dresser, MD;  Location: [facility];  Service: Cardiovascular;  Laterality: N/A;   DILATION AND CURETTAGE OF UTERUS     ENDARTERECTOMY FEMORAL Right 03/02/2015   Procedure: ENDARTERECTOMY RIGHT FEMORAL;  Surgeon: Angelia Mould, MD;  Location: [facility];  Service: Vascular;  Laterality: Right;   ESOPHAGOGASTRODUODENOSCOPY (EGD) WITH PROPOFOL N/A 12/01/2020   Procedure: ESOPHAGOGASTRODUODENOSCOPY (EGD) WITH PROPOFOL;  Surgeon: Jesusita Oka, MD;  Location: [facility];  Service: General;  Laterality: N/A;   FACIAL COSMETIC SURGERY Left 2002   "related to [place] @ age 17; left eye/side of face droopy; tried to make area symmetrical"   FEMORAL-POPLITEAL BYPASS GRAFT Right 03/02/2015   Procedure: BYPASS GRAFT FEMORAL-BELOW KNEE POPLITEAL ARTERY;  Surgeon: Angelia Mould, MD;  Location: [place];  Service: Vascular;  Laterality: Right;   INGUINAL HERNIA REPAIR Bilateral 2002   IR ANGIO INTRA EXTRACRAN SEL COM CAROTID INNOMINATE UNI L MOD SED  11/16/2020   IR CT HEAD LTD  11/13/2020   IR PERCUTANEOUS ART THROMBECTOMY/INFUSION INTRACRANIAL INC DIAG ANGIO  11/13/2020   OVARIAN CYST REMOVAL Left    PEG PLACEMENT N/A 12/01/2020   Procedure: PERCUTANEOUS ENDOSCOPIC GASTROSTOMY (PEG) PLACEMENT;  Surgeon: Jesusita Oka, MD;  Location: [facility];  Service: General;  Laterality: N/A;   PERIPHERAL VASCULAR CATHETERIZATION N/A 01/16/2015   Procedure: Abdominal Aortogram;  Surgeon: Angelia Mould, MD;  Location: [facility];  Service: Cardiovascular;  Laterality: N/A;   POSTERIOR LUMBAR FUSION  2015   "have plates and screws in there"   [place] N/A 11/13/2020   Procedure: IR WITH ANESTHESIA;  Surgeon: Radiologist,  Medication, MD;  Location: [place];  Service: Radiology;  Laterality: N/A;   TONSILLECTOMY      FAMILY HISTORY: Family History  Problem Relation Age of Onset   Liver cancer Mother    Cancer Mother        Liver   Hypertension Mother    Lung cancer Father    Cancer Father        Lung   Breast cancer Sister    Cancer Sister        Breast    SOCIAL HISTORY: Social History   Socioeconomic History   Marital status: Married    Spouse name: Engineer, technical sales   Number of children: Not on file   Years of education: Not on file   Highest education level: Not on file  Occupational History   Not on file  Tobacco Use   Smoking status: Former    Packs/day: 1.00    Years: 50.00    Total pack years: 50.00    Types: Cigarettes    Quit date: 12/15/2014    Years since quitting: 7.4   Smokeless tobacco: Never  Vaping Use   Vaping Use: Never used  Substance and Sexual Activity   Alcohol use: Yes    Comment: 11/18/2017 "might have a couple glasses of wine/month; if that"   Drug use: Not on file   Sexual activity: Not Currently  Other Topics Concern   Not on file  Social History Narrative   11/21/21 living at [facility]   Social Determinants of Health   Financial Resource Strain: Not on file  Food Insecurity: Not on file  Transportation Needs: Not on file  Physical Activity: Not on file  Stress: Not on file  Social Connections: Not on file  Intimate Partner Violence: Not on file      Marcial Pacas, M.D. Ph.D.  [facility] [address] [place], [place] [address] Ph: [phone] Fax: [phone]  CC:  Virgie Dad, MD [place],  [place] [id number]  Virgie Dad, MD

## 2022-05-22 NOTE — Progress Notes (Signed)
Botox 100 units x 2 vials  NDC: [id number] EXP: 2026/06 LOT: [id number]  SP

## 2022-05-24 DIAGNOSIS — I69351 Hemiplegia and hemiparesis following cerebral infarction affecting right dominant side: Secondary | ICD-10-CM | POA: Diagnosis not present

## 2022-05-24 DIAGNOSIS — I63312 Cerebral infarction due to thrombosis of left middle cerebral artery: Secondary | ICD-10-CM | POA: Diagnosis not present

## 2022-05-24 DIAGNOSIS — I5032 Chronic diastolic (congestive) heart failure: Secondary | ICD-10-CM | POA: Diagnosis not present

## 2022-05-27 DIAGNOSIS — I5032 Chronic diastolic (congestive) heart failure: Secondary | ICD-10-CM | POA: Diagnosis not present

## 2022-05-27 DIAGNOSIS — I63312 Cerebral infarction due to thrombosis of left middle cerebral artery: Secondary | ICD-10-CM | POA: Diagnosis not present

## 2022-05-27 DIAGNOSIS — I69351 Hemiplegia and hemiparesis following cerebral infarction affecting right dominant side: Secondary | ICD-10-CM | POA: Diagnosis not present

## 2022-05-28 DIAGNOSIS — I480 Paroxysmal atrial fibrillation: Secondary | ICD-10-CM | POA: Diagnosis not present

## 2022-05-28 DIAGNOSIS — I6602 Occlusion and stenosis of left middle cerebral artery: Secondary | ICD-10-CM | POA: Diagnosis not present

## 2022-05-28 DIAGNOSIS — I5032 Chronic diastolic (congestive) heart failure: Secondary | ICD-10-CM | POA: Diagnosis not present

## 2022-05-30 DIAGNOSIS — I6602 Occlusion and stenosis of left middle cerebral artery: Secondary | ICD-10-CM | POA: Diagnosis not present

## 2022-05-30 DIAGNOSIS — I480 Paroxysmal atrial fibrillation: Secondary | ICD-10-CM | POA: Diagnosis not present

## 2022-05-30 DIAGNOSIS — I5032 Chronic diastolic (congestive) heart failure: Secondary | ICD-10-CM | POA: Diagnosis not present

## 2022-05-31 DIAGNOSIS — I5032 Chronic diastolic (congestive) heart failure: Secondary | ICD-10-CM | POA: Diagnosis not present

## 2022-05-31 DIAGNOSIS — I69351 Hemiplegia and hemiparesis following cerebral infarction affecting right dominant side: Secondary | ICD-10-CM | POA: Diagnosis not present

## 2022-05-31 DIAGNOSIS — I63312 Cerebral infarction due to thrombosis of left middle cerebral artery: Secondary | ICD-10-CM | POA: Diagnosis not present

## 2022-06-04 ENCOUNTER — Non-Acute Institutional Stay (SKILLED_NURSING_FACILITY): Payer: Medicare Other | Admitting: Orthopedic Surgery

## 2022-06-04 ENCOUNTER — Encounter: Payer: Self-pay | Admitting: Orthopedic Surgery

## 2022-06-04 DIAGNOSIS — I480 Paroxysmal atrial fibrillation: Secondary | ICD-10-CM

## 2022-06-04 DIAGNOSIS — I6602 Occlusion and stenosis of left middle cerebral artery: Secondary | ICD-10-CM | POA: Diagnosis not present

## 2022-06-04 DIAGNOSIS — J439 Emphysema, unspecified: Secondary | ICD-10-CM | POA: Diagnosis not present

## 2022-06-04 DIAGNOSIS — I10 Essential (primary) hypertension: Secondary | ICD-10-CM

## 2022-06-04 DIAGNOSIS — K219 Gastro-esophageal reflux disease without esophagitis: Secondary | ICD-10-CM

## 2022-06-04 DIAGNOSIS — F5101 Primary insomnia: Secondary | ICD-10-CM | POA: Diagnosis not present

## 2022-06-04 DIAGNOSIS — G8191 Hemiplegia, unspecified affecting right dominant side: Secondary | ICD-10-CM

## 2022-06-04 DIAGNOSIS — F331 Major depressive disorder, recurrent, moderate: Secondary | ICD-10-CM

## 2022-06-04 DIAGNOSIS — I63312 Cerebral infarction due to thrombosis of left middle cerebral artery: Secondary | ICD-10-CM | POA: Diagnosis not present

## 2022-06-04 DIAGNOSIS — L600 Ingrowing nail: Secondary | ICD-10-CM | POA: Diagnosis not present

## 2022-06-04 DIAGNOSIS — E782 Mixed hyperlipidemia: Secondary | ICD-10-CM

## 2022-06-04 DIAGNOSIS — I5032 Chronic diastolic (congestive) heart failure: Secondary | ICD-10-CM

## 2022-06-04 DIAGNOSIS — I2510 Atherosclerotic heart disease of native coronary artery without angina pectoris: Secondary | ICD-10-CM

## 2022-06-04 DIAGNOSIS — I69351 Hemiplegia and hemiparesis following cerebral infarction affecting right dominant side: Secondary | ICD-10-CM | POA: Diagnosis not present

## 2022-06-04 NOTE — Progress Notes (Unsigned)
Location:   [facility] [place] Room Number: 116-A Place of Service:  SNF [phone]) Provider:  Windell Moulding, NP  PCP: Virgie Dad, MD  Patient Care Team: Virgie Dad, MD as PCP - General (Internal Medicine) Jerline Pain, MD as PCP - Cardiology (Cardiology) Deboraha Sprang, MD as PCP - Electrophysiology (Cardiology) Jovita Gamma, MD as Consulting Physician (Neurosurgery)  Extended Emergency Contact Information Primary Emergency Contact: Appelhans,[name] Address: [place] of [place] Phone: [phone] Mobile Phone: [phone] Relation: Spouse Secondary Emergency Contact: Mccadden,[name] Address: [address]          [place], [place] [address] [place] of [place] Mobile Phone: [phone] Relation: Son  Code Status:  FULL CODE Goals of care: Advanced Directive information    06/04/2022   10:35 AM  Advanced Directives  Does Patient Have a Medical Advance Directive? Yes  Type of Advance Directive Living will  Does patient want to make changes to medical advance directive? No - Patient declined     Chief Complaint  Patient presents with   Medical Management of Chronic Issues    Routine Visit.    Health Maintenance    Discuss the need for Hepatitis C Screening, AWV, and Lung Cancer Screening.    Immunizations    Discuss the need for Shingrix vaccine, and Covid Booster.    HPI:  Pt is a 77 y.o. female seen today for medical management of chronic diseases.    She currently resides on the skilled nursing unit at [facility] 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, lives in skilled nursing, working with PT, able to stand and pivot with 1 person assistance, Botox injections 02/07, continues to wears braces to right side HTN- BUN/creat 31/0.9 (12/04), see trends below, remains on amlodipine HLD- total 182, LDL 89 (11/2021), statin intolerance, remains on Zetia PAF- TSH 0.29 04/29/2022, HR controlled with amiodarone, on Eliquis for clot prevention CHF- 2D echo LVEF 60-65% 06/2021, remains on furosemide prn CAD- remains on Zetia and Eliquis Depression- no mood changes, remains on Wellbutrin GERD- hgb 13.3 (12/04), remains on Protonix Insomnia- remains on Ambien prn Right great toe pain- believes it is ingrown, pain relieved with warm compress, scheduled to see podiatry 02/26  Recent blood pressures:  02/13- 110/76  01/23- 107/62  Recent weights:  02/08- 101 lbs  01/04- 97.2 lbs  12/01- 98.7 lbs     Past Medical History:  Diagnosis Date   Anxiety    Arthritis    "some in my lower back; probably elbows, knees" (11/18/2017)   Atrial fibrillation (HCC)    Bell's palsy    when pt. was 77 yrs old, when under stress the left side of face will droop.   Complication of anesthesia    "vascular OR 2016; BP bottomed out; couldn't get it regulated; ended up in ICU for DAYS" (11/18/2017)   GERD (gastroesophageal reflux disease)    History of kidney stones    Hypertension    Hypertrophic  cardiomyopathy ([place])    severe LV basilar hypertrophy witn no evidence of significant outflow tract obstruction, EF 65-70%, mild LAE, mild TR, grade 1a diastolic dysfunction [id number] (Dr. Candee Furbish) (Atrial Septal Hypertrophy pattern)-- Intra-op TEE with dsignificant outflow tract obstruction - AI, MR & TR   Insomnia    Mild aortic sclerosis    Osteopenia    Peripheral vascular disease (HCC)    Syncope    , Vagal   Past Surgical History:   Procedure Laterality Date   AUGMENTATION MAMMAPLASTY Bilateral    BACK SURGERY     CARDIAC CATHETERIZATION N/A 05/07/2016   Procedure: Left Heart Cath and Coronary Angiography;  Surgeon: Burnell Blanks, MD;  Location: [place] CV LAB;  Service: Cardiovascular;  Laterality: N/A;   CARDIOVERSION N/A 09/24/2017   Procedure: CARDIOVERSION;  Surgeon: Larey Dresser, MD;  Location: [facility] ENDOSCOPY;  Service: Cardiovascular;  Laterality: N/A;   DILATION AND CURETTAGE OF UTERUS     ENDARTERECTOMY FEMORAL Right 03/02/2015   Procedure: ENDARTERECTOMY RIGHT FEMORAL;  Surgeon: Angelia Mould, MD;  Location: [facility];  Service: Vascular;  Laterality: Right;   ESOPHAGOGASTRODUODENOSCOPY (EGD) WITH PROPOFOL N/A 12/01/2020   Procedure: ESOPHAGOGASTRODUODENOSCOPY (EGD) WITH PROPOFOL;  Surgeon: Jesusita Oka, MD;  Location: [facility] ENDOSCOPY;  Service: General;  Laterality: N/A;   FACIAL COSMETIC SURGERY Left 2002   "related to [place] @ age 53; left eye/side of face droopy; tried to make area symmetrical"   FEMORAL-POPLITEAL BYPASS GRAFT Right 03/02/2015   Procedure: BYPASS GRAFT FEMORAL-BELOW KNEE POPLITEAL ARTERY;  Surgeon: Angelia Mould, MD;  Location: [facility];  Service: Vascular;  Laterality: Right;   INGUINAL HERNIA REPAIR Bilateral 2002   IR ANGIO INTRA EXTRACRAN SEL COM CAROTID INNOMINATE UNI L MOD SED  11/16/2020   IR CT HEAD LTD  11/13/2020   IR PERCUTANEOUS ART THROMBECTOMY/INFUSION INTRACRANIAL INC DIAG ANGIO  11/13/2020   OVARIAN CYST REMOVAL Left    PEG PLACEMENT N/A 12/01/2020   Procedure: PERCUTANEOUS ENDOSCOPIC GASTROSTOMY (PEG) PLACEMENT;  Surgeon: Jesusita Oka, MD;  Location: [facility];  Service: General;  Laterality: N/A;   PERIPHERAL VASCULAR CATHETERIZATION N/A 01/16/2015   Procedure: Abdominal Aortogram;  Surgeon: Angelia Mould, MD;  Location: [facility] CV LAB;  Service: Cardiovascular;  Laterality: N/A;   POSTERIOR LUMBAR FUSION  2015   "have plates and  screws in there"   [place] N/A 11/13/2020   Procedure: IR WITH ANESTHESIA;  Surgeon: Radiologist, Medication, MD;  Location: [facility];  Service: Radiology;  Laterality: N/A;   TONSILLECTOMY      Allergies  Allergen Reactions   Amoxicillin Other (See Comments)    UTI Has patient had a PCN reaction causing immediate rash, facial/tongue/throat swelling, SOB or lightheadedness with hypotension: No Has patient had a PCN reaction causing severe rash involving mucus membranes or skin necrosis: No Has patient had a PCN reaction that required hospitalization: No Has patient had a PCN reaction occurring within the last 10 years: Yes--UTI ONLY If all of the above answers are "NO", then may proceed with Cephalosporin use.    Atenolol Cough   Crestor [Rosuvastatin Calcium] Other (See Comments)    Muscle aches   Pravastatin Other (See Comments)    Muscle aches   Rosuvastatin Calcium    Sulfa Antibiotics Nausea Only   Codeine Other (See Comments)    hallucinations    Allergies as of 06/04/2022       Reactions   Amoxicillin Other (See Comments)  UTI Has patient had a PCN reaction causing immediate rash, facial/tongue/throat swelling, SOB or lightheadedness with hypotension: No Has patient had a PCN reaction causing severe rash involving mucus membranes or skin necrosis: No Has patient had a PCN reaction that required hospitalization: No Has patient had a PCN reaction occurring within the last 10 years: Yes--UTI ONLY If all of the above answers are "NO", then may proceed with Cephalosporin use.   Atenolol Cough   Crestor [rosuvastatin Calcium] Other (See Comments)   Muscle aches   Pravastatin Other (See Comments)   Muscle aches   Rosuvastatin Calcium    Sulfa Antibiotics Nausea Only   Codeine Other (See Comments)   hallucinations        Medication List        Accurate as of June 04, 2022 10:35 AM. If you have any questions, ask your nurse or doctor.           acetaminophen 325 MG tablet Commonly known as: TYLENOL Take 650 mg by mouth 2 (two) times daily.   aluminum-magnesium hydroxide-simethicone [id number] MG/5ML Susp Commonly known as: MAALOX Take 30 mLs by mouth every 4 (four) hours as needed (heartburn).   amiodarone 200 MG tablet Commonly known as: PACERONE Take 200 mg by mouth daily. May give crushed   amLODipine 5 MG tablet Commonly known as: NORVASC Take 5 mg by mouth daily.   apixaban 5 MG Tabs tablet Commonly known as: ELIQUIS Take 5 mg by mouth 2 (two) times daily. May give crushed   bisacodyl 5 MG EC tablet Commonly known as: DULCOLAX Take 5 mg by mouth 3 (three) times daily as needed for moderate constipation.   Botox 100 units Solr injection Generic drug: botulinum toxin Type A Inject 300 units into the muscles every 3 months.   buPROPion 150 MG 24 hr tablet Commonly known as: WELLBUTRIN XL Take 150 mg by mouth daily.   carbamide peroxide 6.5 % OTIC solution Commonly known as: DEBROX 5 drops 2 (two) times daily.   ezetimibe 10 MG tablet Commonly known as: ZETIA Take 10 mg by mouth daily. Oral if crushed   furosemide 20 MG tablet Commonly known as: LASIX Take 20 mg by mouth daily as needed. Give Lasix 59m for weight gain equal or greater than 3 pounds   hydrALAZINE 10 MG tablet Commonly known as: APRESOLINE Take 10 mg by mouth 3 (three) times daily as needed (SBP > 165 or DBP > 95).   ipratropium-albuterol 0.5-2.5 (3) MG/3ML Soln Commonly known as: DUONEB Inhale 3 mLs into the lungs every 4 (four) hours as needed.   ketoconazole 2 % cream Commonly known as: NIZORAL Apply 1 application  topically daily as needed for irritation.   nystatin cream Commonly known as: MYCOSTATIN Apply 1 application topically 2 (two) times daily as needed for dry skin (apply to perirectal rash).   ondansetron 4 MG tablet Commonly known as: ZOFRAN Take 4 mg by mouth every 6 (six) hours as needed for nausea or  vomiting.   OXYGEN Inhale 3 L into the lungs as directed. to maintain sats > or equal to 90%. May titrate Three Times A Day; 07:00 AM - 03:00 PM, 03:00 PM - 11:00 PM, 11:00 PM - 7:00 am   pantoprazole 40 MG tablet Commonly known as: PROTONIX Take 40 mg by mouth 2 (two) times daily.   polyethylene glycol powder 17 GM/SCOOP powder Commonly known as: GLYCOLAX/MIRALAX Take 17 g by mouth daily.   promethazine 12.5 MG suppository Commonly known  as: PHENERGAN Place 12.5 mg rectally every 6 (six) hours as needed for nausea or vomiting.   Stiolto Respimat 2.5-2.5 MCG/ACT Aers Generic drug: Tiotropium Bromide-Olodaterol Inhale 2 puffs into the lungs daily.   Systane 0.4-0.3 % Soln Generic drug: Polyethyl Glycol-Propyl Glycol Place 2 drops into both eyes 4 (four) times daily as needed (irritation).   Voltaren 1 % Gel Generic drug: diclofenac Sodium Apply one application to right hip and knee four times daily as needed.   zinc oxide 20 % ointment Apply 1 Application topically as needed for irritation.   zolpidem 5 MG tablet Commonly known as: AMBIEN Take 0.5 tablets (2.5 mg total) by mouth at bedtime as needed for sleep.        Review of Systems  Constitutional:  Negative for activity change, appetite change, fatigue and fever.  HENT:  Negative for congestion, ear pain, sore throat and tinnitus.   Eyes:  Negative for visual disturbance.  Respiratory:  Positive for shortness of breath and wheezing. Negative for cough.   Cardiovascular:  Negative for chest pain and leg swelling.  Gastrointestinal:  Negative for abdominal distention and abdominal pain.  Genitourinary:  Negative for dysuria.  Musculoskeletal:  Positive for arthralgias and gait problem. Negative for myalgias.  Neurological:  Positive for speech difficulty and weakness. Negative for dizziness and headaches.  Psychiatric/Behavioral:  Positive for dysphoric mood and sleep disturbance. Negative for confusion. The  patient is not nervous/anxious.     Immunization History  Administered Date(s) Administered   Fluad Quad(high Dose 65+) 02/05/2022   Influenza Split 01/16/2009, 04/30/2010, 01/26/2016, 02/07/2018, 02/01/2020   Influenza, High Dose Seasonal PF 02/07/2018, 01/25/2019, 02/28/2021, 02/05/2022   Influenza,inj,Quad PF,6+ Mos 03/06/2012, 01/26/2016, 01/10/2017   Influenza,inj,quad, With Preservative 02/14/2015   Moderna Covid-19 Vaccine Bivalent Booster 69yr & up 02/28/2021   PFIZER(Purple Top)SARS-COV-2 Vaccination 05/06/2019, 05/27/2019, 01/27/2020, 09/18/2020   Pneumococcal Conjugate-13 03/19/2017   Pneumococcal Polysaccharide-23 02/14/2015   Respiratory Syncytial Virus Vaccine,Recomb Aduvanted(Arexvy) 02/15/2022   Td 02/25/2005   Tdap 05/19/2013   Zoster, Live 07/28/2006   Pertinent  Health Maintenance Due  Topic Date Due   INFLUENZA VACCINE  Completed   DEXA SCAN  Discontinued      07/07/2021    7:20 PM 07/08/2021    9:00 AM 07/08/2021    9:30 PM 07/09/2021    8:00 AM 05/07/2022   11:59 AM  Fall Risk  Falls in the past year?     0  Was there an injury with Fall?     0  Fall Risk Category Calculator     0  (RETIRED) Patient Fall Risk Level High fall risk High fall risk High fall risk Moderate fall risk   Patient at Risk for Falls Due to     No Fall Risks  Fall risk Follow up     Falls evaluation completed   Functional Status Survey:    Vitals:   06/04/22 1022  BP: 110/76  Pulse: 74  Resp: 14  Temp: 98.3 F (36.8 C)  SpO2: 96%  Weight: 101 lb (45.8 kg)  Height: 5' 3"$  (1.6 m)   Body mass index is 17.89 kg/m. Physical Exam Vitals reviewed.  Constitutional:      General: She is not in acute distress. HENT:     Head: Normocephalic.     Right Ear: There is no impacted cerumen.     Left Ear: There is no impacted cerumen.     Nose: Nose normal.     Mouth/Throat:  Mouth: Mucous membranes are moist.  Eyes:     General:        Right eye: No discharge.        Left  eye: No discharge.  Cardiovascular:     Rate and Rhythm: Normal rate. Rhythm irregular.     Pulses: Normal pulses.          Dorsalis pedis pulses are 2+ on the right side and 2+ on the left side.       Posterior tibial pulses are 2+ on the right side and 2+ on the left side.     Heart sounds: Normal heart sounds.  Pulmonary:     Effort: Pulmonary effort is normal. No respiratory distress.     Breath sounds: Normal breath sounds. No wheezing or rales.  Abdominal:     General: Bowel sounds are normal. There is no distension.     Palpations: Abdomen is soft.     Tenderness: There is no abdominal tenderness.  Musculoskeletal:     Cervical back: Neck supple.     Right lower leg: No edema.     Left lower leg: No edema.  Feet:     Right foot:     Skin integrity: Skin integrity normal.     Toenail Condition: Right toenails are abnormally thick and ingrown.     Left foot:     Skin integrity: Skin integrity normal.     Toenail Condition: Left toenails are abnormally thick.  Skin:    General: Skin is warm and dry.     Capillary Refill: Capillary refill takes less than 2 seconds.  Neurological:     General: No focal deficit present.     Mental Status: She is alert. Mental status is at baseline.     Motor: Weakness present.     Gait: Gait abnormal.     Comments: Right sided weakness/rigidity,right hand contracture, speech slurred  Psychiatric:        Mood and Affect: Mood normal.        Behavior: Behavior normal.     Labs reviewed: Recent Labs    07/06/21 1605 07/06/21 1612 07/07/21 0800 07/08/21 0717 07/09/21 0317 07/11/21 0000 09/18/21 0000 12/03/21 0000 03/18/22 0500  NA 138   < > 140 141 141   < > 135* 139 140  K 4.4   < > 4.3 4.0 3.8   < > 5.1 4.4 4.3  CL 104   < > 107 105 109   < > 101 104 106  CO2 30  --  27 28 29   $ < > 27* 27* 26*  GLUCOSE 92   < > 80 87 90  --   --   --   --   BUN 30*   < > 26* 22 21   < > 32* 28* 31*  CREATININE 0.98   < > 0.79 0.71 0.73   < >  0.9 0.9 0.9  CALCIUM 8.8*  --  8.7* 9.0 8.7*   < > 8.8 9.0 8.9  MG 2.1  --   --  1.9 1.9  --   --   --   --    < > = values in this interval not displayed.   Recent Labs    07/06/21 1605 12/03/21 0000 12/10/21 0000 03/18/22 0500  AST 33 38* 32 33  ALT 45* 48* 38* 39*  ALKPHOS 76 122 122 89  BILITOT 0.7  --   --   --  PROT 6.2*  --   --   --   ALBUMIN 3.2* 3.4* 3.5 3.4*   Recent Labs    07/06/21 1605 07/06/21 1612 07/08/21 0717 07/09/21 0317 07/11/21 0000 03/18/22 0500  WBC 9.3  --  9.8 9.5 8.3 5.9  NEUTROABS 6.5  --   --   --   --   --   HGB 14.6   < > 14.9 14.3 14.1 13.3  HCT 45.2   < > 45.4 43.7 42 41  MCV 93.0  --  91.2 90.7  --   --   PLT 241  --  235 237 224 224   < > = values in this interval not displayed.   Lab Results  Component Value Date   TSH 0.29 (A) 04/29/2022   Lab Results  Component Value Date   HGBA1C 6.0 (H) 11/13/2020   Lab Results  Component Value Date   CHOL 182 11/22/2021   HDL 80 (A) 11/22/2021   LDLCALC 89 11/22/2021   TRIG 64 11/22/2021   CHOLHDL 3.0 11/13/2020    Significant Diagnostic Results in last 30 days:  No results found.  Assessment/Plan 1. Pulmonary emphysema, unspecified emphysema type ([place]) - followed by pulmonary - 02/19 increased sob with PT> sats stable> resolved with Duoneb - cont oxygen - cont Respimat and Duonebs  2. 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 Essential hypertension - controlled with amlodipine  4. Mixed hyperlipidemia - statin intolerance - LDL stable - cont Zetia - cont diet low in fat and avoid fried foods  5. PAF (paroxysmal atrial fibrillation) (HCC) - HR controlled with amlodipine - cont Eliquis for clot prevention  6. Chronic diastolic congestive heart failure (HCC) - compensated - cont furosemide prn  7. Coronary artery disease involving native coronary artery  of native heart without angina pectoris - cont Zetia and Eliquis  8. Moderate episode of recurrent major depressive disorder (HCC) - no mood changes - good family support - cont Wellbutrin  9. Gastroesophageal reflux disease without esophagitis - hgb stable - cont Protonix  10. Primary insomnia - cont Ambien prn  11. Ingrown nail of great toe of right foot - involved right great toe - pain resolved with warm compress - podiatry scheduled 02/26   Family/ staff Communication: plan discussed with patient and nurse  Labs/tests ordered: none

## 2022-06-05 DIAGNOSIS — I5032 Chronic diastolic (congestive) heart failure: Secondary | ICD-10-CM | POA: Diagnosis not present

## 2022-06-05 DIAGNOSIS — I69351 Hemiplegia and hemiparesis following cerebral infarction affecting right dominant side: Secondary | ICD-10-CM | POA: Diagnosis not present

## 2022-06-05 DIAGNOSIS — M6389 Disorders of muscle in diseases classified elsewhere, multiple sites: Secondary | ICD-10-CM | POA: Diagnosis not present

## 2022-06-05 DIAGNOSIS — I63312 Cerebral infarction due to thrombosis of left middle cerebral artery: Secondary | ICD-10-CM | POA: Diagnosis not present

## 2022-06-05 DIAGNOSIS — R482 Apraxia: Secondary | ICD-10-CM | POA: Diagnosis not present

## 2022-06-05 DIAGNOSIS — R278 Other lack of coordination: Secondary | ICD-10-CM | POA: Diagnosis not present

## 2022-06-05 DIAGNOSIS — R293 Abnormal posture: Secondary | ICD-10-CM | POA: Diagnosis not present

## 2022-06-05 DIAGNOSIS — R471 Dysarthria and anarthria: Secondary | ICD-10-CM | POA: Diagnosis not present

## 2022-06-06 DIAGNOSIS — I480 Paroxysmal atrial fibrillation: Secondary | ICD-10-CM | POA: Diagnosis not present

## 2022-06-06 DIAGNOSIS — I5032 Chronic diastolic (congestive) heart failure: Secondary | ICD-10-CM | POA: Diagnosis not present

## 2022-06-06 DIAGNOSIS — I6602 Occlusion and stenosis of left middle cerebral artery: Secondary | ICD-10-CM | POA: Diagnosis not present

## 2022-06-07 DIAGNOSIS — I63312 Cerebral infarction due to thrombosis of left middle cerebral artery: Secondary | ICD-10-CM | POA: Diagnosis not present

## 2022-06-07 DIAGNOSIS — R293 Abnormal posture: Secondary | ICD-10-CM | POA: Diagnosis not present

## 2022-06-07 DIAGNOSIS — R278 Other lack of coordination: Secondary | ICD-10-CM | POA: Diagnosis not present

## 2022-06-07 DIAGNOSIS — M6389 Disorders of muscle in diseases classified elsewhere, multiple sites: Secondary | ICD-10-CM | POA: Diagnosis not present

## 2022-06-07 DIAGNOSIS — R471 Dysarthria and anarthria: Secondary | ICD-10-CM | POA: Diagnosis not present

## 2022-06-07 DIAGNOSIS — R482 Apraxia: Secondary | ICD-10-CM | POA: Diagnosis not present

## 2022-06-07 DIAGNOSIS — I5032 Chronic diastolic (congestive) heart failure: Secondary | ICD-10-CM | POA: Diagnosis not present

## 2022-06-07 DIAGNOSIS — I69351 Hemiplegia and hemiparesis following cerebral infarction affecting right dominant side: Secondary | ICD-10-CM | POA: Diagnosis not present

## 2022-06-10 ENCOUNTER — Ambulatory Visit: Payer: Medicare Other | Admitting: Podiatry

## 2022-06-10 ENCOUNTER — Encounter: Payer: Self-pay | Admitting: Podiatry

## 2022-06-10 DIAGNOSIS — L608 Other nail disorders: Secondary | ICD-10-CM

## 2022-06-10 NOTE — Progress Notes (Signed)
This patient presents to the office with pain along the outside border right big toe.  She says it has been painful wearing her shoes the last few weeks.  She presents to the office with her hsband.  She is in a wheelchair with history of CVA and vascula surgery.  She has coagulation defect due to eliquis. She presents for evaluation and treatment of painful big toe.    General Appearance  Alert, conversant and in no acute stress.  Vascular  Dorsalis pedis and posterior tibial  pulses are weakly  palpable  bilaterally.  Capillary return is within normal limits  bilaterally. Temperature is within normal limits  bilaterally.  Neurologic  Senn-Weinstein monofilament wire test within normal limits  bilaterally. Muscle power within normal limits bilaterally.  Nails Thick disfigured discolored nails with subungual debris  from hallux bilaterally. No evidence of bacterial infection or drainage bilaterally. Pincer toenail right hallux.  Orthopedic  No limitations of motion  feet .  No crepitus or effusions noted.  No bony pathology or digital deformities noted.  Skin  normotropic skin with no porokeratosis noted bilaterally.  No signs of infections or ulcers noted.     Painful pincer toenail right hallux.    Debride pincer toenail along the lateral nail groove.  Told to soak at home in epsom salts.  RTC 3 months.   Gardiner Barefoot DPM

## 2022-06-12 DIAGNOSIS — R482 Apraxia: Secondary | ICD-10-CM | POA: Diagnosis not present

## 2022-06-12 DIAGNOSIS — I5032 Chronic diastolic (congestive) heart failure: Secondary | ICD-10-CM | POA: Diagnosis not present

## 2022-06-12 DIAGNOSIS — R293 Abnormal posture: Secondary | ICD-10-CM | POA: Diagnosis not present

## 2022-06-12 DIAGNOSIS — I69351 Hemiplegia and hemiparesis following cerebral infarction affecting right dominant side: Secondary | ICD-10-CM | POA: Diagnosis not present

## 2022-06-12 DIAGNOSIS — R471 Dysarthria and anarthria: Secondary | ICD-10-CM | POA: Diagnosis not present

## 2022-06-12 DIAGNOSIS — R278 Other lack of coordination: Secondary | ICD-10-CM | POA: Diagnosis not present

## 2022-06-12 DIAGNOSIS — M6389 Disorders of muscle in diseases classified elsewhere, multiple sites: Secondary | ICD-10-CM | POA: Diagnosis not present

## 2022-06-12 DIAGNOSIS — I63312 Cerebral infarction due to thrombosis of left middle cerebral artery: Secondary | ICD-10-CM | POA: Diagnosis not present

## 2022-06-13 ENCOUNTER — Telehealth: Payer: Self-pay | Admitting: *Deleted

## 2022-06-13 DIAGNOSIS — M6389 Disorders of muscle in diseases classified elsewhere, multiple sites: Secondary | ICD-10-CM | POA: Diagnosis not present

## 2022-06-13 DIAGNOSIS — R293 Abnormal posture: Secondary | ICD-10-CM | POA: Diagnosis not present

## 2022-06-13 DIAGNOSIS — I63312 Cerebral infarction due to thrombosis of left middle cerebral artery: Secondary | ICD-10-CM | POA: Diagnosis not present

## 2022-06-13 DIAGNOSIS — R278 Other lack of coordination: Secondary | ICD-10-CM | POA: Diagnosis not present

## 2022-06-13 DIAGNOSIS — I69351 Hemiplegia and hemiparesis following cerebral infarction affecting right dominant side: Secondary | ICD-10-CM | POA: Diagnosis not present

## 2022-06-13 DIAGNOSIS — I5032 Chronic diastolic (congestive) heart failure: Secondary | ICD-10-CM | POA: Diagnosis not present

## 2022-06-13 DIAGNOSIS — R471 Dysarthria and anarthria: Secondary | ICD-10-CM | POA: Diagnosis not present

## 2022-06-13 DIAGNOSIS — R482 Apraxia: Secondary | ICD-10-CM | POA: Diagnosis not present

## 2022-06-13 NOTE — Telephone Encounter (Signed)
Pts husband called back and pt is scheduled to see Dr Amalia Hailey on  Monday 3.4.2024

## 2022-06-13 NOTE — Telephone Encounter (Signed)
Beverly Li w/ [facility]-([phone]) is calling because the patient is still complaining to the right great toe that she was seen for last week. There is a black area at the tip of the toe, is there anything that they need to address or do other than soaking in epsom salts? Please advise.

## 2022-06-13 NOTE — Telephone Encounter (Signed)
Called and spoke with [facility] giving recommendations by doctor that she should  be  scheduled for a pincher nail by one of the surgeons, (Dr Amalia Hailey), please schedule

## 2022-06-14 DIAGNOSIS — R471 Dysarthria and anarthria: Secondary | ICD-10-CM | POA: Diagnosis not present

## 2022-06-14 DIAGNOSIS — R293 Abnormal posture: Secondary | ICD-10-CM | POA: Diagnosis not present

## 2022-06-14 DIAGNOSIS — R482 Apraxia: Secondary | ICD-10-CM | POA: Diagnosis not present

## 2022-06-14 DIAGNOSIS — I63312 Cerebral infarction due to thrombosis of left middle cerebral artery: Secondary | ICD-10-CM | POA: Diagnosis not present

## 2022-06-14 DIAGNOSIS — R278 Other lack of coordination: Secondary | ICD-10-CM | POA: Diagnosis not present

## 2022-06-14 DIAGNOSIS — M6389 Disorders of muscle in diseases classified elsewhere, multiple sites: Secondary | ICD-10-CM | POA: Diagnosis not present

## 2022-06-14 DIAGNOSIS — I5032 Chronic diastolic (congestive) heart failure: Secondary | ICD-10-CM | POA: Diagnosis not present

## 2022-06-14 DIAGNOSIS — I69351 Hemiplegia and hemiparesis following cerebral infarction affecting right dominant side: Secondary | ICD-10-CM | POA: Diagnosis not present

## 2022-06-17 ENCOUNTER — Encounter: Payer: Self-pay | Admitting: Internal Medicine

## 2022-06-17 ENCOUNTER — Ambulatory Visit: Payer: Medicare Other | Admitting: Podiatry

## 2022-06-17 ENCOUNTER — Non-Acute Institutional Stay (SKILLED_NURSING_FACILITY): Payer: Medicare Other | Admitting: Internal Medicine

## 2022-06-17 DIAGNOSIS — R21 Rash and other nonspecific skin eruption: Secondary | ICD-10-CM | POA: Diagnosis not present

## 2022-06-17 DIAGNOSIS — I69351 Hemiplegia and hemiparesis following cerebral infarction affecting right dominant side: Secondary | ICD-10-CM | POA: Diagnosis not present

## 2022-06-17 DIAGNOSIS — K219 Gastro-esophageal reflux disease without esophagitis: Secondary | ICD-10-CM | POA: Diagnosis not present

## 2022-06-17 DIAGNOSIS — I2510 Atherosclerotic heart disease of native coronary artery without angina pectoris: Secondary | ICD-10-CM | POA: Diagnosis not present

## 2022-06-17 DIAGNOSIS — I5032 Chronic diastolic (congestive) heart failure: Secondary | ICD-10-CM | POA: Diagnosis not present

## 2022-06-17 DIAGNOSIS — L600 Ingrowing nail: Secondary | ICD-10-CM | POA: Diagnosis not present

## 2022-06-17 DIAGNOSIS — E782 Mixed hyperlipidemia: Secondary | ICD-10-CM

## 2022-06-17 DIAGNOSIS — I10 Essential (primary) hypertension: Secondary | ICD-10-CM | POA: Diagnosis not present

## 2022-06-17 DIAGNOSIS — R7989 Other specified abnormal findings of blood chemistry: Secondary | ICD-10-CM

## 2022-06-17 DIAGNOSIS — J439 Emphysema, unspecified: Secondary | ICD-10-CM | POA: Diagnosis not present

## 2022-06-17 DIAGNOSIS — I480 Paroxysmal atrial fibrillation: Secondary | ICD-10-CM

## 2022-06-17 DIAGNOSIS — F5101 Primary insomnia: Secondary | ICD-10-CM

## 2022-06-17 DIAGNOSIS — F331 Major depressive disorder, recurrent, moderate: Secondary | ICD-10-CM

## 2022-06-17 DIAGNOSIS — G8191 Hemiplegia, unspecified affecting right dominant side: Secondary | ICD-10-CM | POA: Diagnosis not present

## 2022-06-17 DIAGNOSIS — I63312 Cerebral infarction due to thrombosis of left middle cerebral artery: Secondary | ICD-10-CM | POA: Diagnosis not present

## 2022-06-17 NOTE — Progress Notes (Signed)
Location:  [place] Room Number: [place] of Service:  SNF [phone]) Provider:  Virgie Dad, MD   Virgie Dad, MD  Patient Care Team: Virgie Dad, MD as PCP - General (Internal Medicine) Jerline Pain, MD as PCP - Cardiology (Cardiology) Deboraha Sprang, MD as PCP - Electrophysiology (Cardiology) Jovita Gamma, MD as Consulting Physician (Neurosurgery)  Extended Emergency Contact Information Primary Emergency Contact: Bertucci,[name] Address: [place] of [place] Phone: [phone] Mobile Phone: [phone] Relation: Spouse Secondary Emergency Contact: Livsey,[name] Address: [address]          [place], [place] [address] John[place] L[place] of [place] Mobile Phone: [phone] Relation: Son  Code Status:  Full code Goals of care: Advanced Directive information    06/17/2022   11:38 AM  Advanced Directives  Does Patient Have a Medical Advance Directive? Yes  Type of Advance Directive Living will  Does patient want to make changes to medical advance directive? No - Patient declined     Chief Complaint  Patient presents with   Medical Management of Chronic Issues    Patient is being seen for a routine visit    Quality Metric Gaps    Discussed the need for AWV, Lung screening and a shingles vaccine     HPI:  Pt is a 77 y.o. female seen today for medical management of chronic diseases.   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 No new Nursing issues. No Behavior issues Her weight is stable Needs Assist with her transfers No Falls Cognitively intact Aphasia much Better  Continues to  work with therapy No Cough or SOB Did have rash in her Neck area which is new No itching or pain  Wt Readings from Last 3  Encounters:  06/17/22 101 lb (45.8 kg)  06/04/22 101 lb (45.8 kg)  05/07/22 97 lb 2 oz (44.1 kg)  Also c/o Insomnia  Past Medical History:  Diagnosis Date   Anxiety    Arthritis    "some in my lower back; probably elbows, knees" (11/18/2017)   Atrial fibrillation (HCC)    Bell's palsy    when pt. was 77 yrs old, when under stress the left side of face will droop.   Complication of anesthesia    "vascular OR 2016; BP bottomed out; couldn't get it regulated; ended up in ICU for DAYS" (11/18/2017)   GERD (gastroesophageal reflux disease)    History of kidney stones    Hypertension    Hypertrophic cardiomyopathy (HCC)    severe LV basilar hypertrophy witn no evidence of significant outflow tract obstruction, EF 65-70%, mild LAE, mild TR, grade 1a diastolic dysfunction 9/47/09 (Dr. Candee Furbish) (Atrial Septal Hypertrophy pattern)-- Intra-op TEE with dsignificant outflow tract obstruction - AI, MR & TR   Insomnia    Mild aortic sclerosis    Osteopenia    Peripheral vascular disease (HCC)    Syncope    , Vagal   Past Surgical History:  Procedure Laterality Date   AUGMENTATION MAMMAPLASTY Bilateral    BACK SURGERY     CARDIAC CATHETERIZATION N/A 05/07/2016   Procedure: Left Heart Cath and Coronary Angiography;  Surgeon: Burnell Blanks, MD;  Location: [place] [facility];  Service: Cardiovascular;  Laterality: N/A;   CARDIOVERSION N/A 09/24/2017   Procedure: CARDIOVERSION;  Surgeon: Larey Dresser, MD;  Location: [facility] ENDOSCOPY;  Service: Cardiovascular;  Laterality: N/A;   DILATION AND CURETTAGE OF UTERUS     ENDARTERECTOMY FEMORAL Right 03/02/2015   Procedure: ENDARTERECTOMY RIGHT FEMORAL;  Surgeon: Angelia Mould, MD;  Location: [place];  Service: Vascular;  Laterality: Right;   ESOPHAGOGASTRODUODENOSCOPY (EGD) WITH PROPOFOL N/A 12/01/2020   Procedure: ESOPHAGOGASTRODUODENOSCOPY (EGD) WITH PROPOFOL;  Surgeon: Jesusita Oka, MD;  Location: [place] [facility]OSCOPY;  Service: General;   Laterality: N/A;   FACIAL COSMETIC SURGERY Left 2002   "related to [place] @ age 8; left eye/side of face droopy; tried to make area symmetrical"   FEMORAL-POPLITEAL BYPASS GRAFT Right 03/02/2015   Procedure: BYPASS GRAFT FEMORAL-BELOW KNEE POPLITEAL ARTERY;  Surgeon: Angelia Mould, MD;  Location: [place];  Service: Vascular;  Laterality: Right;   INGUINAL HERNIA REPAIR Bilateral 2002   IR ANGIO INTRA EXTRACRAN SEL COM CAROTID INNOMINATE UNI L MOD SED  11/16/2020   IR CT HEAD LTD  11/13/2020   IR PERCUTANEOUS ART THROMBECTOMY/INFUSION INTRACRANIAL INC DIAG ANGIO  11/13/2020   OVARIAN CYST REMOVAL Left    PEG PLACEMENT N/A 12/01/2020   Procedure: PERCUTANEOUS ENDOSCOPIC GASTROSTOMY (PEG) PLACEMENT;  Surgeon: Jesusita Oka, MD;  Location: [place];  Service: General;  Laterality: N/A;   PERIPHERAL VASCULAR CATHETERIZATION N/A 01/16/2015   Procedure: Abdominal Aortogram;  Surgeon: Angelia Mould, MD;  Location: [place] [facility];  Service: Cardiovascular;  Laterality: N/A;   POSTERIOR LUMBAR FUSION  2015   "have plates and screws in there"   [place] N/A 11/13/2020   Procedure: IR WITH ANESTHESIA;  Surgeon: Radiologist, Medication, MD;  Location: [facility];  Service: Radiology;  Laterality: N/A;   TONSILLECTOMY      Allergies  Allergen Reactions   Amoxicillin Other (See Comments)    UTI Has patient had a PCN reaction causing immediate rash, facial/tongue/throat swelling, SOB or lightheadedness with hypotension: No Has patient had a PCN reaction causing severe rash involving mucus membranes or skin necrosis: No Has patient had a PCN reaction that required hospitalization: No Has patient had a PCN reaction occurring within the last 10 years: Yes--UTI ONLY If all of the above answers are "NO", then may proceed with Cephalosporin use.    Atenolol Cough   Crestor [Rosuvastatin Calcium] Other (See Comments)    Muscle aches   Pravastatin Other (See Comments)     Muscle aches   Rosuvastatin Calcium    Sulfa Antibiotics Nausea Only   Codeine Other (See Comments)    hallucinations    Outpatient Encounter Medications as of 06/17/2022  Medication Sig   acetaminophen (TYLENOL) 325 MG tablet Take 650 mg by mouth 2 (two) times daily.   aluminum-magnesium hydroxide-simethicone (MAALOX) [id number] MG/5ML SUSP Take 30 mLs by mouth every 4 (four) hours as needed (heartburn).   amiodarone (PACERONE) 200 MG tablet Take 200 mg by mouth daily. May give crushed   amLODipine (NORVASC) 5 MG tablet Take 5 mg by mouth daily.   apixaban (ELIQUIS) 5 MG TABS tablet Take 5 mg by mouth 2 (two) times daily. May give crushed   bisacodyl (DULCOLAX) 5 MG EC tablet Take 5 mg by mouth 3 (three) times daily as needed for moderate constipation.   botulinum toxin Type A (BOTOX) 100 units SOLR injection Inject 300 units into  the muscles every 3 months.   buPROPion (WELLBUTRIN XL) 150 MG 24 hr tablet Take 150 mg by mouth daily.   carbamide peroxide (DEBROX) 6.5 % OTIC solution 5 drops 2 (two) times daily.   diclofenac Sodium (VOLTAREN) 1 % GEL Apply one application to right hip and knee four times daily as needed.   ezetimibe (ZETIA) 10 MG tablet Take 10 mg by mouth daily. Oral if crushed   furosemide (LASIX) 20 MG tablet Take 20 mg by mouth daily as needed. Give Lasix '20mg'$  for weight gain equal or greater than 3 pounds   hydrALAZINE (APRESOLINE) 10 MG tablet Take 10 mg by mouth 3 (three) times daily as needed (SBP > 165 or DBP > 95).   ipratropium-albuterol (DUONEB) 0.5-2.5 (3) MG/3ML SOLN Inhale 3 mLs into the lungs every 4 (four) hours as needed.   ketoconazole (NIZORAL) 2 % cream Apply 1 application  topically daily as needed for irritation.   nystatin cream (MYCOSTATIN) Apply 1 application topically 2 (two) times daily as needed for dry skin (apply to perirectal rash).   ondansetron (ZOFRAN) 4 MG tablet Take 4 mg by mouth every 6 (six) hours as needed for nausea or vomiting.    OXYGEN Inhale 3 L into the lungs as directed. to maintain sats > or equal to 90%. May titrate Three Times A Day; 07:00 AM - 03:00 PM, 03:00 PM - 11:00 PM, 11:00 PM - 7:00 am   pantoprazole (PROTONIX) 40 MG tablet Take 40 mg by mouth 2 (two) times daily.   Polyethyl Glycol-Propyl Glycol (SYSTANE) 0.4-0.3 % SOLN Place 2 drops into both eyes 4 (four) times daily as needed (irritation).   polyethylene glycol powder (GLYCOLAX/MIRALAX) 17 GM/SCOOP powder Take 17 g by mouth daily.   promethazine (PHENERGAN) 12.5 MG suppository Place 12.5 mg rectally every 6 (six) hours as needed for nausea or vomiting.   Tiotropium Bromide-Olodaterol (STIOLTO RESPIMAT) 2.5-2.5 MCG/ACT AERS Inhale 2 puffs into the lungs daily.   zinc oxide 20 % ointment Apply 1 Application topically as needed for irritation.   zolpidem (AMBIEN) 5 MG tablet Take 0.5 tablets (2.5 mg total) by mouth at bedtime as needed for sleep.   Facility-Administered Encounter Medications as of 06/17/2022  Medication   botulinum toxin Type A (BOTOX) injection 100 Units   triamcinolone acetonide (KENALOG) 10 MG/ML injection 10 mg    Review of Systems  Constitutional:  Negative for activity change and appetite change.  HENT: Negative.    Respiratory:  Negative for cough and shortness of breath.   Cardiovascular:  Negative for leg swelling.  Gastrointestinal:  Negative for constipation.  Genitourinary: Negative.   Musculoskeletal:  Positive for gait problem. Negative for arthralgias and myalgias.  Skin:  Positive for rash.  Neurological:  Negative for dizziness and weakness.  Psychiatric/Behavioral:  Positive for sleep disturbance. Negative for confusion and dysphoric mood.     Immunization History  Administered Date(s) Administered   Fluad Quad(high Dose 65+) 02/05/2022   Influenza Split 01/16/2009, 04/30/2010, 01/26/2016, 02/07/2018, 02/01/2020   Influenza, High Dose Seasonal PF 02/07/2018, 01/25/2019, 02/28/2021, 02/05/2022    Influenza,inj,Quad PF,6+ Mos 03/06/2012, 01/26/2016, 01/10/2017   Influenza,inj,quad, With Preservative 02/14/2015   Moderna Covid-19 Vaccine Bivalent Booster 65yr & up 02/28/2021   PFIZER(Purple Top)SARS-COV-2 Vaccination 05/06/2019, 05/27/2019, 01/27/2020, 09/18/2020   Pneumococcal Conjugate-13 03/19/2017   Pneumococcal Polysaccharide-23 02/14/2015   Respiratory Syncytial Virus Vaccine,Recomb Aduvanted(Arexvy) 02/15/2022   Td 02/25/2005   Tdap 05/19/2013   Zoster, Live 07/28/2006   Pertinent  Health Maintenance  Due  Topic Date Due   INFLUENZA VACCINE  Completed   DEXA SCAN  Discontinued      07/07/2021    7:20 PM 07/08/2021    9:00 AM 07/08/2021    9:30 PM 07/09/2021    8:00 AM 05/07/2022   11:59 AM  Fall Risk  Falls in the past year?     0  Was there an injury with Fall?     0  Fall Risk Category Calculator     0  (RETIRED) Patient Fall Risk Level High fall risk High fall risk High fall risk Moderate fall risk   Patient at Risk for Falls Due to     No Fall Risks  Fall risk Follow up     Falls evaluation completed   Functional Status Survey:    Vitals:   06/17/22 1111  BP: 110/76  Pulse: 74  Resp: 14  Temp: 98.3 F (36.8 C)  TempSrc: Temporal  SpO2: 91%  Weight: 101 lb (45.8 kg)  Height: 5\' 3"  (1.6 m)   Body mass index is 17.89 kg/m. Physical Exam Vitals reviewed.  Constitutional:      Appearance: Normal appearance.  HENT:     Head: Normocephalic.     Nose: Nose normal.     Mouth/Throat:     Mouth: Mucous membranes are moist.     Pharynx: Oropharynx is clear.  Eyes:     Pupils: Pupils are equal, round, and reactive to light.  Cardiovascular:     Rate and Rhythm: Normal rate and regular rhythm.     Pulses: Normal pulses.     Heart sounds: Normal heart sounds. No murmur heard. Pulmonary:     Effort: Pulmonary effort is normal.     Breath sounds: Normal breath sounds.  Abdominal:     General: Abdomen is flat. Bowel sounds are normal.     Palpations:  Abdomen is soft.  Musculoskeletal:        General: No swelling.     Cervical back: Neck supple.  Skin:    General: Skin is warm.  Neurological:     Mental Status: She is alert and oriented to person, place, and time.     Comments:  Right spastic Hemiparesis Expressive Aphasia    Psychiatric:        Mood and Affect: Mood normal.        Thought Content: Thought content normal.     Labs reviewed: Recent Labs    07/06/21 1605 07/06/21 1612 07/07/21 0800 07/08/21 0717 07/09/21 0317 07/11/21 0000 09/18/21 0000 12/03/21 0000 03/18/22 0500  NA 138   < > 140 141 141   < > 135* 139 140  K 4.4   < > 4.3 4.0 3.8   < > 5.1 4.4 4.3  CL 104   < > 107 105 109   < > 101 104 106  CO2 30  --  27 28 29    < > 27* 27* 26*  GLUCOSE 92   < > 80 87 90  --   --   --   --   BUN 30*   < > 26* 22 21   < > 32* 28* 31*  CREATININE 0.98   < > 0.79 0.71 0.73   < > 0.9 0.9 0.9  CALCIUM 8.8*  --  8.7* 9.0 8.7*   < > 8.8 9.0 8.9  MG 2.1  --   --  1.9 1.9  --   --   --   --    < > =  values in this interval not displayed.   Recent Labs    07/06/21 1605 12/03/21 0000 12/10/21 0000 03/18/22 0500  AST 33 38* 32 33  ALT 45* 48* 38* 39*  ALKPHOS 76 122 122 89  BILITOT 0.7  --   --   --   PROT 6.2*  --   --   --   ALBUMIN 3.2* 3.4* 3.5 3.4*   Recent Labs    07/06/21 1605 07/06/21 1612 07/08/21 0717 07/09/21 0317 07/11/21 0000 03/18/22 0500  WBC 9.3  --  9.8 9.5 8.3 5.9  NEUTROABS 6.5  --   --   --   --   --   HGB 14.6   < > 14.9 14.3 14.1 13.3  HCT 45.2   < > 45.4 43.7 42 41  MCV 93.0  --  91.2 90.7  --   --   PLT 241  --  235 237 224 224   < > = values in this interval not displayed.   Lab Results  Component Value Date   TSH 0.29 (A) 04/29/2022   Lab Results  Component Value Date   HGBA1C 6.0 (H) 11/13/2020   Lab Results  Component Value Date   CHOL 182 11/22/2021   HDL 80 (A) 11/22/2021   LDLCALC 89 11/22/2021   TRIG 64 11/22/2021   CHOLHDL 3.0 11/13/2020    Significant  Diagnostic Results in last 30 days:  No results found.  Assessment/Plan 1. Pulmonary emphysema, unspecified emphysema type ([place]) Chronic Oxygen Also on [place]  2. Right hemiplegia ([place]) Works with therapy Walks with Assist On Eliquis  3. Essential hypertension Norvasc  4. Mixed hyperlipidemia Zetia    5. PAF (paroxysmal atrial fibrillation) (HCC) Amiodarone and Eliquis  6. Chronic diastolic congestive heart failure (HCC) Lasix PRN  7. Coronary artery disease    8. Moderate episode of recurrent major depressive disorder (HCC) Wellbutrin  9. Gastroesophageal reflux disease without esophagitis Protonix  10. Primary insomnia Change Ambien to 5 mg  11. Ingrown nail of great toe of right foot Following with Podiatry  12. Rash Hydrocortisone 2.5 % BID with Ketoconazole  13. Low TSH level Order Free T3 and T 4    Family/ staff Communication:   Labs/tests ordered:   Total time spent in this patient care encounter was  40_  minutes; greater than 50% of the visit spent counseling patient and staff, reviewing records , Labs and coordinating care for problems addressed at this encounter.

## 2022-06-17 NOTE — Progress Notes (Signed)
Chief Complaint  Patient presents with   Ingrown Toenail    Right hallux ingrown toenail.     Subjective: Patient presents today for evaluation of pain to the medial border of the right great toe. 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  It is very sensitive to touch.  Patient was last seen here in the office on 06/10/2022 for routine nail debridement by Dr. Prudence Davidson.  She continued to have pain and tenderness to this portion of the nail.  Patient presents today for further treatment and evaluation.  Past Medical History:  Diagnosis Date   Anxiety    Arthritis    "some in my lower back; probably elbows, knees" (11/18/2017)   Atrial fibrillation (HCC)    Bell's palsy    when pt. was 77 yrs old, when under stress the left side of face will droop.   Complication of anesthesia    "vascular OR 2016; BP bottomed out; couldn't get it regulated; ended up in ICU for DAYS" (11/18/2017)   GERD (gastroesophageal reflux disease)    History of kidney stones    Hypertension    Hypertrophic cardiomyopathy (HCC)    severe LV basilar hypertrophy witn no evidence of significant outflow tract obstruction, EF 65-70%, mild LAE, mild TR, grade 1a diastolic dysfunction [id number] (Dr. Candee Furbish) (Atrial Septal Hypertrophy pattern)-- Intra-op TEE with dsignificant outflow tract obstruction - AI, MR & TR   Insomnia    Mild aortic sclerosis    Osteopenia    Peripheral vascular disease (HCC)    Syncope    , Vagal   Past Surgical History:  Procedure Laterality Date   AUGMENTATION MAMMAPLASTY Bilateral    BACK SURGERY     CARDIAC CATHETERIZATION N/A 05/07/2016   Procedure: Left Heart Cath and Coronary Angiography;  Surgeon: Burnell Blanks, MD;  Location: [facility];  Service: Cardiovascular;  Laterality: N/A;   CARDIOVERSION N/A 09/24/2017   Procedure: CARDIOVERSION;  Surgeon: Larey Dresser, MD;  Location: [facility] ENDOSCOPY;  Service: Cardiovascular;   Laterality: N/A;   DILATION AND CURETTAGE OF UTERUS     ENDARTERECTOMY FEMORAL Right 03/02/2015   Procedure: ENDARTERECTOMY RIGHT FEMORAL;  Surgeon: Angelia Mould, MD;  Location: [place];  Service: Vascular;  Laterality: Right;   ESOPHAGOGASTRODUODENOSCOPY (EGD) WITH PROPOFOL N/A 12/01/2020   Procedure: ESOPHAGOGASTRODUODENOSCOPY (EGD) WITH PROPOFOL;  Surgeon: Jesusita Oka, MD;  Location: [facility]OSCOPY;  Service: General;  Laterality: N/A;   FACIAL COSMETIC SURGERY Left 2002   "related to [place] @ age 1; left eye/side of face droopy; tried to make area symmetrical"   FEMORAL-POPLITEAL BYPASS GRAFT Right 03/02/2015   Procedure: BYPASS GRAFT FEMORAL-BELOW KNEE POPLITEAL ARTERY;  Surgeon: Angelia Mould, MD;  Location: [facility];  Service: Vascular;  Laterality: Right;   INGUINAL HERNIA REPAIR Bilateral 2002   IR ANGIO INTRA EXTRACRAN SEL COM CAROTID INNOMINATE UNI L MOD SED  11/16/2020   IR CT HEAD LTD  11/13/2020   IR PERCUTANEOUS ART THROMBECTOMY/INFUSION INTRACRANIAL INC DIAG ANGIO  11/13/2020   OVARIAN CYST REMOVAL Left    PEG PLACEMENT N/A 12/01/2020   Procedure: PERCUTANEOUS ENDOSCOPIC GASTROSTOMY (PEG) PLACEMENT;  Surgeon: Jesusita Oka, MD;  Location: [facility];  Service: General;  Laterality: N/A;   PERIPHERAL VASCULAR CATHETERIZATION N/A 01/16/2015   Procedure: Abdominal Aortogram;  Surgeon: Angelia Mould, MD;  Location: [facility];  Service: Cardiovascular;  Laterality: N/A;   POSTERIOR LUMBAR FUSION  2015   "  have plates and screws in there"   RADIOLOGY WITH ANESTHESIA N/A 11/13/2020   Procedure: IR WITH ANESTHESIA;  Surgeon: Radiologist, Medication, MD;  Location: [facility];  Service: Radiology;  Laterality: N/A;   TONSILLECTOMY     Allergies  Allergen Reactions   Amoxicillin Other (See Comments)    UTI Has patient had a PCN reaction causing immediate rash, facial/tongue/throat swelling, SOB or lightheadedness with hypotension: No Has patient had a  PCN reaction causing severe rash involving mucus membranes or skin necrosis: No Has patient had a PCN reaction that required hospitalization: No Has patient had a PCN reaction occurring within the last 10 years: Yes--UTI ONLY If all of the above answers are "NO", then may proceed with Cephalosporin use.    Atenolol Cough   Crestor [Rosuvastatin Calcium] Other (See Comments)    Muscle aches   Pravastatin Other (See Comments)    Muscle aches   Rosuvastatin Calcium    Sulfa Antibiotics Nausea Only   Codeine Other (See Comments)    hallucinations    Objective:  General: Well developed, nourished, in no acute distress, alert and oriented x3   Dermatology: Skin is warm, dry and supple bilateral.  Medial border of the right great toenail is tender with evidence of an ingrowing nail secondary to pincer nail deformity. Pain on palpation noted to the border of the nail fold. The remaining nails appear unremarkable at this time. There are no open sores, lesions.  Vascular: DP and PT pulses palpable.  No clinical evidence of vascular compromise  Neruologic: Grossly intact via light touch bilateral.  Musculoskeletal: No pedal deformity noted  Assesement: #1 Paronychia with ingrowing nail medial border right great toe  -Patient evaluated. -After evaluating the toe, simple debridement of the offending border of the nail plate was performed today using a nail nipper.  The patient tolerated this very well and had significant relief after debridement -The toe is no longer painful after debridement.  She was very satisfied -Return to clinic next scheduled appointment for routine footcare  Edrick Kins, DPM [facility]  Dr. Edrick Kins, DPM    [address], [place] [address]                Office [phone]  Fax [phone]

## 2022-06-18 DIAGNOSIS — R471 Dysarthria and anarthria: Secondary | ICD-10-CM | POA: Diagnosis not present

## 2022-06-18 DIAGNOSIS — R278 Other lack of coordination: Secondary | ICD-10-CM | POA: Diagnosis not present

## 2022-06-18 DIAGNOSIS — I63312 Cerebral infarction due to thrombosis of left middle cerebral artery: Secondary | ICD-10-CM | POA: Diagnosis not present

## 2022-06-18 DIAGNOSIS — E039 Hypothyroidism, unspecified: Secondary | ICD-10-CM | POA: Diagnosis not present

## 2022-06-18 DIAGNOSIS — R293 Abnormal posture: Secondary | ICD-10-CM | POA: Diagnosis not present

## 2022-06-18 DIAGNOSIS — I69351 Hemiplegia and hemiparesis following cerebral infarction affecting right dominant side: Secondary | ICD-10-CM | POA: Diagnosis not present

## 2022-06-18 DIAGNOSIS — R482 Apraxia: Secondary | ICD-10-CM | POA: Diagnosis not present

## 2022-06-18 DIAGNOSIS — M6389 Disorders of muscle in diseases classified elsewhere, multiple sites: Secondary | ICD-10-CM | POA: Diagnosis not present

## 2022-06-18 DIAGNOSIS — I6602 Occlusion and stenosis of left middle cerebral artery: Secondary | ICD-10-CM | POA: Diagnosis not present

## 2022-06-18 DIAGNOSIS — I5032 Chronic diastolic (congestive) heart failure: Secondary | ICD-10-CM | POA: Diagnosis not present

## 2022-06-18 DIAGNOSIS — I480 Paroxysmal atrial fibrillation: Secondary | ICD-10-CM | POA: Diagnosis not present

## 2022-06-18 LAB — TSH: TSH: 0.55 (ref 0.41–5.90)

## 2022-06-19 DIAGNOSIS — I69351 Hemiplegia and hemiparesis following cerebral infarction affecting right dominant side: Secondary | ICD-10-CM | POA: Diagnosis not present

## 2022-06-19 DIAGNOSIS — I5032 Chronic diastolic (congestive) heart failure: Secondary | ICD-10-CM | POA: Diagnosis not present

## 2022-06-19 DIAGNOSIS — I63312 Cerebral infarction due to thrombosis of left middle cerebral artery: Secondary | ICD-10-CM | POA: Diagnosis not present

## 2022-06-20 DIAGNOSIS — I480 Paroxysmal atrial fibrillation: Secondary | ICD-10-CM | POA: Diagnosis not present

## 2022-06-20 DIAGNOSIS — I5032 Chronic diastolic (congestive) heart failure: Secondary | ICD-10-CM | POA: Diagnosis not present

## 2022-06-20 DIAGNOSIS — I63312 Cerebral infarction due to thrombosis of left middle cerebral artery: Secondary | ICD-10-CM | POA: Diagnosis not present

## 2022-06-20 DIAGNOSIS — I6602 Occlusion and stenosis of left middle cerebral artery: Secondary | ICD-10-CM | POA: Diagnosis not present

## 2022-06-20 DIAGNOSIS — I69351 Hemiplegia and hemiparesis following cerebral infarction affecting right dominant side: Secondary | ICD-10-CM | POA: Diagnosis not present

## 2022-06-21 DIAGNOSIS — I5032 Chronic diastolic (congestive) heart failure: Secondary | ICD-10-CM | POA: Diagnosis not present

## 2022-06-21 DIAGNOSIS — I69351 Hemiplegia and hemiparesis following cerebral infarction affecting right dominant side: Secondary | ICD-10-CM | POA: Diagnosis not present

## 2022-06-21 DIAGNOSIS — I63312 Cerebral infarction due to thrombosis of left middle cerebral artery: Secondary | ICD-10-CM | POA: Diagnosis not present

## 2022-06-24 DIAGNOSIS — I69351 Hemiplegia and hemiparesis following cerebral infarction affecting right dominant side: Secondary | ICD-10-CM | POA: Diagnosis not present

## 2022-06-24 DIAGNOSIS — I5032 Chronic diastolic (congestive) heart failure: Secondary | ICD-10-CM | POA: Diagnosis not present

## 2022-06-24 DIAGNOSIS — I63312 Cerebral infarction due to thrombosis of left middle cerebral artery: Secondary | ICD-10-CM | POA: Diagnosis not present

## 2022-06-25 DIAGNOSIS — I5032 Chronic diastolic (congestive) heart failure: Secondary | ICD-10-CM | POA: Diagnosis not present

## 2022-06-25 DIAGNOSIS — I63312 Cerebral infarction due to thrombosis of left middle cerebral artery: Secondary | ICD-10-CM | POA: Diagnosis not present

## 2022-06-25 DIAGNOSIS — I69351 Hemiplegia and hemiparesis following cerebral infarction affecting right dominant side: Secondary | ICD-10-CM | POA: Diagnosis not present

## 2022-06-26 DIAGNOSIS — I5032 Chronic diastolic (congestive) heart failure: Secondary | ICD-10-CM | POA: Diagnosis not present

## 2022-06-26 DIAGNOSIS — I6602 Occlusion and stenosis of left middle cerebral artery: Secondary | ICD-10-CM | POA: Diagnosis not present

## 2022-06-26 DIAGNOSIS — I480 Paroxysmal atrial fibrillation: Secondary | ICD-10-CM | POA: Diagnosis not present

## 2022-06-26 DIAGNOSIS — I69351 Hemiplegia and hemiparesis following cerebral infarction affecting right dominant side: Secondary | ICD-10-CM | POA: Diagnosis not present

## 2022-06-26 DIAGNOSIS — I63312 Cerebral infarction due to thrombosis of left middle cerebral artery: Secondary | ICD-10-CM | POA: Diagnosis not present

## 2022-06-27 DIAGNOSIS — I5032 Chronic diastolic (congestive) heart failure: Secondary | ICD-10-CM | POA: Diagnosis not present

## 2022-06-27 DIAGNOSIS — I63312 Cerebral infarction due to thrombosis of left middle cerebral artery: Secondary | ICD-10-CM | POA: Diagnosis not present

## 2022-06-27 DIAGNOSIS — I69351 Hemiplegia and hemiparesis following cerebral infarction affecting right dominant side: Secondary | ICD-10-CM | POA: Diagnosis not present

## 2022-06-28 DIAGNOSIS — I69351 Hemiplegia and hemiparesis following cerebral infarction affecting right dominant side: Secondary | ICD-10-CM | POA: Diagnosis not present

## 2022-06-28 DIAGNOSIS — I63312 Cerebral infarction due to thrombosis of left middle cerebral artery: Secondary | ICD-10-CM | POA: Diagnosis not present

## 2022-06-28 DIAGNOSIS — I5032 Chronic diastolic (congestive) heart failure: Secondary | ICD-10-CM | POA: Diagnosis not present

## 2022-07-01 DIAGNOSIS — I69351 Hemiplegia and hemiparesis following cerebral infarction affecting right dominant side: Secondary | ICD-10-CM | POA: Diagnosis not present

## 2022-07-01 DIAGNOSIS — I5032 Chronic diastolic (congestive) heart failure: Secondary | ICD-10-CM | POA: Diagnosis not present

## 2022-07-01 DIAGNOSIS — I63312 Cerebral infarction due to thrombosis of left middle cerebral artery: Secondary | ICD-10-CM | POA: Diagnosis not present

## 2022-07-02 DIAGNOSIS — I5032 Chronic diastolic (congestive) heart failure: Secondary | ICD-10-CM | POA: Diagnosis not present

## 2022-07-02 DIAGNOSIS — I63312 Cerebral infarction due to thrombosis of left middle cerebral artery: Secondary | ICD-10-CM | POA: Diagnosis not present

## 2022-07-02 DIAGNOSIS — I6602 Occlusion and stenosis of left middle cerebral artery: Secondary | ICD-10-CM | POA: Diagnosis not present

## 2022-07-02 DIAGNOSIS — I480 Paroxysmal atrial fibrillation: Secondary | ICD-10-CM | POA: Diagnosis not present

## 2022-07-02 DIAGNOSIS — I69351 Hemiplegia and hemiparesis following cerebral infarction affecting right dominant side: Secondary | ICD-10-CM | POA: Diagnosis not present

## 2022-07-04 DIAGNOSIS — I69351 Hemiplegia and hemiparesis following cerebral infarction affecting right dominant side: Secondary | ICD-10-CM | POA: Diagnosis not present

## 2022-07-04 DIAGNOSIS — I5032 Chronic diastolic (congestive) heart failure: Secondary | ICD-10-CM | POA: Diagnosis not present

## 2022-07-04 DIAGNOSIS — I63312 Cerebral infarction due to thrombosis of left middle cerebral artery: Secondary | ICD-10-CM | POA: Diagnosis not present

## 2022-07-05 ENCOUNTER — Non-Acute Institutional Stay (SKILLED_NURSING_FACILITY): Payer: Medicare Other | Admitting: Adult Health

## 2022-07-05 ENCOUNTER — Encounter: Payer: Self-pay | Admitting: Adult Health

## 2022-07-05 DIAGNOSIS — R0602 Shortness of breath: Secondary | ICD-10-CM | POA: Diagnosis not present

## 2022-07-05 DIAGNOSIS — J439 Emphysema, unspecified: Secondary | ICD-10-CM | POA: Diagnosis not present

## 2022-07-05 NOTE — Progress Notes (Addendum)
Location:  [facility] Room Number: [place] of Service:  SNF [phone]) Provider:  Earney Hamburg, MD  Patient Care Team: Virgie Dad, MD as PCP - General (Internal Medicine) Jerline Pain, MD as PCP - Cardiology (Cardiology) Deboraha Sprang, MD as PCP - Electrophysiology (Cardiology) Jovita Gamma, MD as Consulting Physician (Neurosurgery)  Extended Emergency Contact Information Primary Emergency Contact: Babula,[name] Address: Veteran [place] of [place] Phone: [phone] Mobile Phone: [phone] Relation: Spouse Secondary Emergency Contact: Piccini,[name] Address: [address]          [place], [place] [address] [place] of [place] Mobile Phone: [phone] Relation: Son  Code Status:  Full code Goals of care: Advanced Directive information    07/05/2022   11:57 AM  Advanced Directives  Does Patient Have a Medical Advance Directive? Yes  Type of Advance Directive Living will  Does patient want to make changes to medical advance directive? No - Patient declined     Chief Complaint  Patient presents with   Acute Visit    Patient is being seen for acute SOB    HPI:  Pt is a 77 y.o. female seen today for an acute visit for SOB  PMH significant for CVA with right sided weakness, HTN, CHF, afib, PAD s/p fem pop, COPD with oxygen dependency, dysphagia and GERD  Nurse reports that Ms. Januszewski had some sob last evening and this morning. This occurred at rest and with exertion. No chest pain. No cough or runny nose or bodyaches. She tried albuterol and reports it did not help very much. She states she doesn't feel well but the sob is not present for the visit She is chronically on oxygen at 2-3 liters 93%. No fever, sputum production.  No weight gain or increased edema. She does have dysphagia due to a prior CVA but there were no choking episodes  reported.    Past Medical History:  Diagnosis Date   Anxiety    Arthritis    "some in my lower back; probably elbows, knees" (11/18/2017)   Atrial fibrillation (HCC)    Bell's palsy    when pt. was 77 yrs old, when under stress the left side of face will droop.   Complication of anesthesia    "vascular OR 2016; BP bottomed out; couldn't get it regulated; ended up in ICU for DAYS" (11/18/2017)   GERD (gastroesophageal reflux disease)    History of kidney stones    Hypertension    Hypertrophic cardiomyopathy (HCC)    severe LV basilar hypertrophy witn no evidence of significant outflow tract obstruction, EF 65-70%, mild LAE, mild TR, grade 1a diastolic dysfunction [id number] (Dr. Candee Furbish) (Atrial Septal Hypertrophy pattern)-- Intra-op TEE with dsignificant outflow tract obstruction - AI, MR & TR   Insomnia    Mild aortic sclerosis    Osteopenia    Peripheral vascular disease (HCC)    Syncope    , Vagal   Past Surgical History:  Procedure Laterality Date   AUGMENTATION MAMMAPLASTY Bilateral    BACK SURGERY     CARDIAC CATHETERIZATION N/A 05/07/2016   Procedure: Left Heart Cath and Coronary Angiography;  Surgeon: Burnell Blanks, MD;  Location: [facility];  Service: Cardiovascular;  Laterality: N/A;   CARDIOVERSION N/A 09/24/2017   Procedure: CARDIOVERSION;  Surgeon: Larey Dresser, MD;  Location: [facility];  Service:  Cardiovascular;  Laterality: N/A;   DILATION AND CURETTAGE OF UTERUS     ENDARTERECTOMY FEMORAL Right 03/02/2015   Procedure: ENDARTERECTOMY RIGHT FEMORAL;  Surgeon: Angelia Mould, MD;  Location: [facility];  Service: Vascular;  Laterality: Right;   ESOPHAGOGASTRODUODENOSCOPY (EGD) WITH PROPOFOL N/A 12/01/2020   Procedure: ESOPHAGOGASTRODUODENOSCOPY (EGD) WITH PROPOFOL;  Surgeon: Jesusita Oka, MD;  Location: [facility];  Service: General;  Laterality: N/A;   FACIAL COSMETIC SURGERY Left 2002   "related to [place] @ age 27; left eye/side of  face droopy; tried to make area symmetrical"   FEMORAL-POPLITEAL BYPASS GRAFT Right 03/02/2015   Procedure: BYPASS GRAFT FEMORAL-BELOW KNEE POPLITEAL ARTERY;  Surgeon: Angelia Mould, MD;  Location: [facility];  Service: Vascular;  Laterality: Right;   INGUINAL HERNIA REPAIR Bilateral 2002   IR ANGIO INTRA EXTRACRAN SEL COM CAROTID INNOMINATE UNI L MOD SED  11/16/2020   IR CT HEAD LTD  11/13/2020   IR PERCUTANEOUS ART THROMBECTOMY/INFUSION INTRACRANIAL INC DIAG ANGIO  11/13/2020   OVARIAN CYST REMOVAL Left    PEG PLACEMENT N/A 12/01/2020   Procedure: PERCUTANEOUS ENDOSCOPIC GASTROSTOMY (PEG) PLACEMENT;  Surgeon: Jesusita Oka, MD;  Location: [facility];  Service: General;  Laterality: N/A;   PERIPHERAL VASCULAR CATHETERIZATION N/A 01/16/2015   Procedure: Abdominal Aortogram;  Surgeon: Angelia Mould, MD;  Location: [facility];  Service: Cardiovascular;  Laterality: N/A;   POSTERIOR LUMBAR FUSION  2015   "have plates and screws in there"   [place] N/A 11/13/2020   Procedure: IR WITH ANESTHESIA;  Surgeon: Radiologist, Medication, MD;  Location: [facility];  Service: Radiology;  Laterality: N/A;   TONSILLECTOMY      Allergies  Allergen Reactions   Amoxicillin Other (See Comments)    UTI Has patient had a PCN reaction causing immediate rash, facial/tongue/throat swelling, SOB or lightheadedness with hypotension: No Has patient had a PCN reaction causing severe rash involving mucus membranes or skin necrosis: No Has patient had a PCN reaction that required hospitalization: No Has patient had a PCN reaction occurring within the last 10 years: Yes--UTI ONLY If all of the above answers are "NO", then may proceed with Cephalosporin use.    Atenolol Cough   Crestor [Rosuvastatin Calcium] Other (See Comments)    Muscle aches   Pravastatin Other (See Comments)    Muscle aches   Rosuvastatin Calcium    Sulfa Antibiotics Nausea Only   Codeine Other (See Comments)     hallucinations    Outpatient Encounter Medications as of 07/05/2022  Medication Sig   acetaminophen (TYLENOL) 325 MG tablet Take 650 mg by mouth 2 (two) times daily.   aluminum-magnesium hydroxide-simethicone (MAALOX) [id number] MG/5ML SUSP Take 30 mLs by mouth every 4 (four) hours as needed (heartburn).   amiodarone (PACERONE) 200 MG tablet Take 200 mg by mouth daily. May give crushed   amLODipine (NORVASC) 5 MG tablet Take 5 mg by mouth daily.   apixaban (ELIQUIS) 5 MG TABS tablet Take 5 mg by mouth 2 (two) times daily. May give crushed   bisacodyl (DULCOLAX) 5 MG EC tablet Take 5 mg by mouth 3 (three) times daily as needed for moderate constipation.   botulinum toxin Type A (BOTOX) 100 units SOLR injection Inject 300 units into the muscles every 3 months.   buPROPion (WELLBUTRIN XL) 150 MG 24 hr tablet Take 150 mg by mouth daily.   diclofenac Sodium (VOLTAREN) 1 % GEL Apply one application to right hip and knee four  times daily as needed.   ezetimibe (ZETIA) 10 MG tablet Take 10 mg by mouth daily. Oral if crushed   furosemide (LASIX) 20 MG tablet Take 20 mg by mouth daily as needed. Give Lasix 20mg  for weight gain equal or greater than 3 pounds   hydrALAZINE (APRESOLINE) 10 MG tablet Take 10 mg by mouth 3 (three) times daily as needed (SBP > 165 or DBP > 95).   ipratropium-albuterol (DUONEB) 0.5-2.5 (3) MG/3ML SOLN Inhale 3 mLs into the lungs every 4 (four) hours as needed.   ketoconazole (NIZORAL) 2 % cream Apply 1 application  topically daily as needed for irritation.   nystatin cream (MYCOSTATIN) Apply 1 application topically 2 (two) times daily as needed for dry skin (apply to perirectal rash).   ondansetron (ZOFRAN) 4 MG tablet Take 4 mg by mouth every 6 (six) hours as needed for nausea or vomiting.   OXYGEN Inhale 3 L into the lungs as directed. to maintain sats > or equal to 90%. May titrate Three Times A Day; 07:00 AM - 03:00 PM, 03:00 PM - 11:00 PM, 11:00 PM - 7:00 am   pantoprazole  (PROTONIX) 40 MG tablet Take 40 mg by mouth 2 (two) times daily.   Polyethyl Glycol-Propyl Glycol (SYSTANE) 0.4-0.3 % SOLN Place 2 drops into both eyes 4 (four) times daily as needed (irritation).   polyethylene glycol powder (GLYCOLAX/MIRALAX) 17 GM/SCOOP powder Take 17 g by mouth daily.   promethazine (PHENERGAN) 12.5 MG suppository Place 12.5 mg rectally every 6 (six) hours as needed for nausea or vomiting.   Tiotropium Bromide-Olodaterol (STIOLTO RESPIMAT) 2.5-2.5 MCG/ACT AERS Inhale 2 puffs into the lungs daily.   zinc oxide 20 % ointment Apply 1 Application topically as needed for irritation.   zolpidem (AMBIEN) 5 MG tablet Take 0.5 tablets (2.5 mg total) by mouth at bedtime as needed for sleep. (Patient not taking: Reported on 07/05/2022)   Facility-Administered Encounter Medications as of 07/05/2022  Medication   botulinum toxin Type A (BOTOX) injection 100 Units    Review of Systems  Immunization History  Administered Date(s) Administered   Fluad Quad(high Dose 65+) 02/05/2022   Influenza Split 01/16/2009, 04/30/2010, 01/26/2016, 02/07/2018, 02/01/2020   Influenza, High Dose Seasonal PF 02/07/2018, 01/25/2019, 02/28/2021, 02/05/2022   Influenza,inj,Quad PF,6+ Mos 03/06/2012, 01/26/2016, 01/10/2017   Influenza,inj,quad, With Preservative 02/14/2015   Moderna Covid-19 Vaccine Bivalent Booster 55yrs & up 02/28/2021   PFIZER(Purple Top)SARS-COV-2 Vaccination 05/06/2019, 05/27/2019, 01/27/2020, 09/18/2020   Pneumococcal Conjugate-13 03/19/2017   Pneumococcal Polysaccharide-23 02/14/2015   Respiratory Syncytial Virus Vaccine,Recomb Aduvanted(Arexvy) 02/15/2022   Td 02/25/2005   Tdap 05/19/2013   Zoster, Live 07/28/2006   Pertinent  Health Maintenance Due  Topic Date Due   INFLUENZA VACCINE  Completed   DEXA SCAN  Discontinued      07/07/2021    7:20 PM 07/08/2021    9:00 AM 07/08/2021    9:30 PM 07/09/2021    8:00 AM 05/07/2022   11:59 AM  Fall Risk  Falls in the past year?      0  Was there an injury with Fall?     0  Fall Risk Category Calculator     0  (RETIRED) Patient Fall Risk Level High fall risk High fall risk High fall risk Moderate fall risk   Patient at Risk for Falls Due to     No Fall Risks  Fall risk Follow up     Falls evaluation completed   Functional Status Survey:    Vitals:  07/05/22 1152  BP: 112/65  Pulse: (!) 53  Resp: 16  Temp: (!) 97.2 F (36.2 C)  TempSrc: Temporal  SpO2: 93%  Weight: 97 lb 9.6 oz (44.3 kg)  Height: 5\' 3"  (1.6 m)   Body mass index is 17.29 kg/m. Physical Exam  Labs reviewed: Recent Labs    07/06/21 1605 07/06/21 1612 07/07/21 0800 07/08/21 0717 07/09/21 0317 07/11/21 0000 09/18/21 0000 12/03/21 0000 03/18/22 0500  NA 138   < > 140 141 141   < > 135* 139 140  K 4.4   < > 4.3 4.0 3.8   < > 5.1 4.4 4.3  CL 104   < > 107 105 109   < > 101 104 106  CO2 30  --  27 28 29    < > 27* 27* 26*  GLUCOSE 92   < > 80 87 90  --   --   --   --   BUN 30*   < > 26* 22 21   < > 32* 28* 31*  CREATININE 0.98   < > 0.79 0.71 0.73   < > 0.9 0.9 0.9  CALCIUM 8.8*  --  8.7* 9.0 8.7*   < > 8.8 9.0 8.9  MG 2.1  --   --  1.9 1.9  --   --   --   --    < > = values in this interval not displayed.   Recent Labs    07/06/21 1605 12/03/21 0000 12/10/21 0000 03/18/22 0500  AST 33 38* 32 33  ALT 45* 48* 38* 39*  ALKPHOS 76 122 122 89  BILITOT 0.7  --   --   --   PROT 6.2*  --   --   --   ALBUMIN 3.2* 3.4* 3.5 3.4*   Recent Labs    07/06/21 1605 07/06/21 1612 07/08/21 0717 07/09/21 0317 07/11/21 0000 03/18/22 0500  WBC 9.3  --  9.8 9.5 8.3 5.9  NEUTROABS 6.5  --   --   --   --   --   HGB 14.6   < > 14.9 14.3 14.1 13.3  HCT 45.2   < > 45.4 43.7 42 41  MCV 93.0  --  91.2 90.7  --   --   PLT 241  --  235 237 224 224   < > = values in this interval not displayed.   Lab Results  Component Value Date   TSH 0.29 (A) 04/29/2022   Lab Results  Component Value Date   HGBA1C 6.0 (H) 11/13/2020   Lab Results   Component Value Date   CHOL 182 11/22/2021   HDL 80 (A) 11/22/2021   LDLCALC 89 11/22/2021   TRIG 64 11/22/2021   CHOLHDL 3.0 11/13/2020    Significant Diagnostic Results in last 30 days:  No results found.  Assessment/Plan 1. SOB (shortness of breath) ?PNA VS CHF VS COPD exacerbations She is in no acute distress with normal vitals on oxygen If worsening send to ED Ordered CXR  2. Pulmonary emphysema, unspecified emphysema type ([place]) Severe noted by pulmonary  On Stiolto Has prn duoneb If normal xray would add prednisone course  3. Chronic respiratory failure with hypoxia (HCC) Continues on 2-3 liters with normal 02 sats for COPD   Addendum: 07/05/22 737 pm CXR showing CHF and small right effusion Ordered lasix 20 mg qd x 3 days Kdur 10 meq qd x 3 days   Family/ staff Communication: discussed  with the resident and her husband at bedside  Labs/tests ordered:  CXR

## 2022-07-08 DIAGNOSIS — I69351 Hemiplegia and hemiparesis following cerebral infarction affecting right dominant side: Secondary | ICD-10-CM | POA: Diagnosis not present

## 2022-07-08 DIAGNOSIS — I5032 Chronic diastolic (congestive) heart failure: Secondary | ICD-10-CM | POA: Diagnosis not present

## 2022-07-08 DIAGNOSIS — I63312 Cerebral infarction due to thrombosis of left middle cerebral artery: Secondary | ICD-10-CM | POA: Diagnosis not present

## 2022-07-09 DIAGNOSIS — I63312 Cerebral infarction due to thrombosis of left middle cerebral artery: Secondary | ICD-10-CM | POA: Diagnosis not present

## 2022-07-09 DIAGNOSIS — I5032 Chronic diastolic (congestive) heart failure: Secondary | ICD-10-CM | POA: Diagnosis not present

## 2022-07-09 DIAGNOSIS — I69351 Hemiplegia and hemiparesis following cerebral infarction affecting right dominant side: Secondary | ICD-10-CM | POA: Diagnosis not present

## 2022-07-10 DIAGNOSIS — I69351 Hemiplegia and hemiparesis following cerebral infarction affecting right dominant side: Secondary | ICD-10-CM | POA: Diagnosis not present

## 2022-07-10 DIAGNOSIS — I63312 Cerebral infarction due to thrombosis of left middle cerebral artery: Secondary | ICD-10-CM | POA: Diagnosis not present

## 2022-07-10 DIAGNOSIS — I5032 Chronic diastolic (congestive) heart failure: Secondary | ICD-10-CM | POA: Diagnosis not present

## 2022-07-11 DIAGNOSIS — I5032 Chronic diastolic (congestive) heart failure: Secondary | ICD-10-CM | POA: Diagnosis not present

## 2022-07-11 DIAGNOSIS — I69351 Hemiplegia and hemiparesis following cerebral infarction affecting right dominant side: Secondary | ICD-10-CM | POA: Diagnosis not present

## 2022-07-11 DIAGNOSIS — I63312 Cerebral infarction due to thrombosis of left middle cerebral artery: Secondary | ICD-10-CM | POA: Diagnosis not present

## 2022-07-12 DIAGNOSIS — I63312 Cerebral infarction due to thrombosis of left middle cerebral artery: Secondary | ICD-10-CM | POA: Diagnosis not present

## 2022-07-12 DIAGNOSIS — I5032 Chronic diastolic (congestive) heart failure: Secondary | ICD-10-CM | POA: Diagnosis not present

## 2022-07-12 DIAGNOSIS — I69351 Hemiplegia and hemiparesis following cerebral infarction affecting right dominant side: Secondary | ICD-10-CM | POA: Diagnosis not present

## 2022-07-15 DIAGNOSIS — I63312 Cerebral infarction due to thrombosis of left middle cerebral artery: Secondary | ICD-10-CM | POA: Diagnosis not present

## 2022-07-15 DIAGNOSIS — R278 Other lack of coordination: Secondary | ICD-10-CM | POA: Diagnosis not present

## 2022-07-15 DIAGNOSIS — I69351 Hemiplegia and hemiparesis following cerebral infarction affecting right dominant side: Secondary | ICD-10-CM | POA: Diagnosis not present

## 2022-07-15 DIAGNOSIS — M6389 Disorders of muscle in diseases classified elsewhere, multiple sites: Secondary | ICD-10-CM | POA: Diagnosis not present

## 2022-07-15 DIAGNOSIS — I5032 Chronic diastolic (congestive) heart failure: Secondary | ICD-10-CM | POA: Diagnosis not present

## 2022-07-15 DIAGNOSIS — R293 Abnormal posture: Secondary | ICD-10-CM | POA: Diagnosis not present

## 2022-07-17 DIAGNOSIS — R278 Other lack of coordination: Secondary | ICD-10-CM | POA: Diagnosis not present

## 2022-07-17 DIAGNOSIS — I5032 Chronic diastolic (congestive) heart failure: Secondary | ICD-10-CM | POA: Diagnosis not present

## 2022-07-17 DIAGNOSIS — R471 Dysarthria and anarthria: Secondary | ICD-10-CM | POA: Diagnosis not present

## 2022-07-17 DIAGNOSIS — I69351 Hemiplegia and hemiparesis following cerebral infarction affecting right dominant side: Secondary | ICD-10-CM | POA: Diagnosis not present

## 2022-07-17 DIAGNOSIS — I63312 Cerebral infarction due to thrombosis of left middle cerebral artery: Secondary | ICD-10-CM | POA: Diagnosis not present

## 2022-07-17 DIAGNOSIS — I6602 Occlusion and stenosis of left middle cerebral artery: Secondary | ICD-10-CM | POA: Diagnosis not present

## 2022-07-17 DIAGNOSIS — R482 Apraxia: Secondary | ICD-10-CM | POA: Diagnosis not present

## 2022-07-17 DIAGNOSIS — M6389 Disorders of muscle in diseases classified elsewhere, multiple sites: Secondary | ICD-10-CM | POA: Diagnosis not present

## 2022-07-17 DIAGNOSIS — I480 Paroxysmal atrial fibrillation: Secondary | ICD-10-CM | POA: Diagnosis not present

## 2022-07-18 DIAGNOSIS — R278 Other lack of coordination: Secondary | ICD-10-CM | POA: Diagnosis not present

## 2022-07-18 DIAGNOSIS — I480 Paroxysmal atrial fibrillation: Secondary | ICD-10-CM | POA: Diagnosis not present

## 2022-07-18 DIAGNOSIS — I5032 Chronic diastolic (congestive) heart failure: Secondary | ICD-10-CM | POA: Diagnosis not present

## 2022-07-18 DIAGNOSIS — I69351 Hemiplegia and hemiparesis following cerebral infarction affecting right dominant side: Secondary | ICD-10-CM | POA: Diagnosis not present

## 2022-07-18 DIAGNOSIS — R471 Dysarthria and anarthria: Secondary | ICD-10-CM | POA: Diagnosis not present

## 2022-07-18 DIAGNOSIS — I6602 Occlusion and stenosis of left middle cerebral artery: Secondary | ICD-10-CM | POA: Diagnosis not present

## 2022-07-18 DIAGNOSIS — M6389 Disorders of muscle in diseases classified elsewhere, multiple sites: Secondary | ICD-10-CM | POA: Diagnosis not present

## 2022-07-18 DIAGNOSIS — I63312 Cerebral infarction due to thrombosis of left middle cerebral artery: Secondary | ICD-10-CM | POA: Diagnosis not present

## 2022-07-18 DIAGNOSIS — R482 Apraxia: Secondary | ICD-10-CM | POA: Diagnosis not present

## 2022-07-19 DIAGNOSIS — I63312 Cerebral infarction due to thrombosis of left middle cerebral artery: Secondary | ICD-10-CM | POA: Diagnosis not present

## 2022-07-19 DIAGNOSIS — M6389 Disorders of muscle in diseases classified elsewhere, multiple sites: Secondary | ICD-10-CM | POA: Diagnosis not present

## 2022-07-19 DIAGNOSIS — I69351 Hemiplegia and hemiparesis following cerebral infarction affecting right dominant side: Secondary | ICD-10-CM | POA: Diagnosis not present

## 2022-07-19 DIAGNOSIS — R278 Other lack of coordination: Secondary | ICD-10-CM | POA: Diagnosis not present

## 2022-07-19 DIAGNOSIS — I5032 Chronic diastolic (congestive) heart failure: Secondary | ICD-10-CM | POA: Diagnosis not present

## 2022-07-22 DIAGNOSIS — I69351 Hemiplegia and hemiparesis following cerebral infarction affecting right dominant side: Secondary | ICD-10-CM | POA: Diagnosis not present

## 2022-07-22 DIAGNOSIS — I5032 Chronic diastolic (congestive) heart failure: Secondary | ICD-10-CM | POA: Diagnosis not present

## 2022-07-22 DIAGNOSIS — M6389 Disorders of muscle in diseases classified elsewhere, multiple sites: Secondary | ICD-10-CM | POA: Diagnosis not present

## 2022-07-22 DIAGNOSIS — R278 Other lack of coordination: Secondary | ICD-10-CM | POA: Diagnosis not present

## 2022-07-22 DIAGNOSIS — I63312 Cerebral infarction due to thrombosis of left middle cerebral artery: Secondary | ICD-10-CM | POA: Diagnosis not present

## 2022-07-23 DIAGNOSIS — M6389 Disorders of muscle in diseases classified elsewhere, multiple sites: Secondary | ICD-10-CM | POA: Diagnosis not present

## 2022-07-23 DIAGNOSIS — I69351 Hemiplegia and hemiparesis following cerebral infarction affecting right dominant side: Secondary | ICD-10-CM | POA: Diagnosis not present

## 2022-07-23 DIAGNOSIS — R278 Other lack of coordination: Secondary | ICD-10-CM | POA: Diagnosis not present

## 2022-07-23 DIAGNOSIS — I63312 Cerebral infarction due to thrombosis of left middle cerebral artery: Secondary | ICD-10-CM | POA: Diagnosis not present

## 2022-07-23 DIAGNOSIS — I5032 Chronic diastolic (congestive) heart failure: Secondary | ICD-10-CM | POA: Diagnosis not present

## 2022-07-24 DIAGNOSIS — R471 Dysarthria and anarthria: Secondary | ICD-10-CM | POA: Diagnosis not present

## 2022-07-24 DIAGNOSIS — R278 Other lack of coordination: Secondary | ICD-10-CM | POA: Diagnosis not present

## 2022-07-24 DIAGNOSIS — I6602 Occlusion and stenosis of left middle cerebral artery: Secondary | ICD-10-CM | POA: Diagnosis not present

## 2022-07-24 DIAGNOSIS — I480 Paroxysmal atrial fibrillation: Secondary | ICD-10-CM | POA: Diagnosis not present

## 2022-07-24 DIAGNOSIS — I63312 Cerebral infarction due to thrombosis of left middle cerebral artery: Secondary | ICD-10-CM | POA: Diagnosis not present

## 2022-07-24 DIAGNOSIS — I5032 Chronic diastolic (congestive) heart failure: Secondary | ICD-10-CM | POA: Diagnosis not present

## 2022-07-24 DIAGNOSIS — I69351 Hemiplegia and hemiparesis following cerebral infarction affecting right dominant side: Secondary | ICD-10-CM | POA: Diagnosis not present

## 2022-07-24 DIAGNOSIS — M6389 Disorders of muscle in diseases classified elsewhere, multiple sites: Secondary | ICD-10-CM | POA: Diagnosis not present

## 2022-07-24 DIAGNOSIS — R482 Apraxia: Secondary | ICD-10-CM | POA: Diagnosis not present

## 2022-07-25 ENCOUNTER — Telehealth: Payer: Self-pay | Admitting: Neurology

## 2022-07-25 DIAGNOSIS — I5032 Chronic diastolic (congestive) heart failure: Secondary | ICD-10-CM | POA: Diagnosis not present

## 2022-07-25 DIAGNOSIS — M6389 Disorders of muscle in diseases classified elsewhere, multiple sites: Secondary | ICD-10-CM | POA: Diagnosis not present

## 2022-07-25 DIAGNOSIS — R278 Other lack of coordination: Secondary | ICD-10-CM | POA: Diagnosis not present

## 2022-07-25 DIAGNOSIS — I69351 Hemiplegia and hemiparesis following cerebral infarction affecting right dominant side: Secondary | ICD-10-CM | POA: Diagnosis not present

## 2022-07-25 DIAGNOSIS — I63312 Cerebral infarction due to thrombosis of left middle cerebral artery: Secondary | ICD-10-CM | POA: Diagnosis not present

## 2022-07-25 NOTE — Telephone Encounter (Signed)
New auth needed before 08/21/22 Botox appt, current auth expired 07/09/22.

## 2022-07-26 ENCOUNTER — Other Ambulatory Visit (HOSPITAL_COMMUNITY): Payer: Self-pay

## 2022-07-26 DIAGNOSIS — R278 Other lack of coordination: Secondary | ICD-10-CM | POA: Diagnosis not present

## 2022-07-26 DIAGNOSIS — I63312 Cerebral infarction due to thrombosis of left middle cerebral artery: Secondary | ICD-10-CM | POA: Diagnosis not present

## 2022-07-26 DIAGNOSIS — I69351 Hemiplegia and hemiparesis following cerebral infarction affecting right dominant side: Secondary | ICD-10-CM | POA: Diagnosis not present

## 2022-07-26 DIAGNOSIS — I5032 Chronic diastolic (congestive) heart failure: Secondary | ICD-10-CM | POA: Diagnosis not present

## 2022-07-26 DIAGNOSIS — M6389 Disorders of muscle in diseases classified elsewhere, multiple sites: Secondary | ICD-10-CM | POA: Diagnosis not present

## 2022-07-26 NOTE — Telephone Encounter (Signed)
Can you verify how many units are needed? Thanks!

## 2022-07-29 DIAGNOSIS — M6389 Disorders of muscle in diseases classified elsewhere, multiple sites: Secondary | ICD-10-CM | POA: Diagnosis not present

## 2022-07-29 DIAGNOSIS — I69351 Hemiplegia and hemiparesis following cerebral infarction affecting right dominant side: Secondary | ICD-10-CM | POA: Diagnosis not present

## 2022-07-29 DIAGNOSIS — I5032 Chronic diastolic (congestive) heart failure: Secondary | ICD-10-CM | POA: Diagnosis not present

## 2022-07-29 DIAGNOSIS — I63312 Cerebral infarction due to thrombosis of left middle cerebral artery: Secondary | ICD-10-CM | POA: Diagnosis not present

## 2022-07-29 DIAGNOSIS — R278 Other lack of coordination: Secondary | ICD-10-CM | POA: Diagnosis not present

## 2022-07-30 DIAGNOSIS — I5032 Chronic diastolic (congestive) heart failure: Secondary | ICD-10-CM | POA: Diagnosis not present

## 2022-07-30 DIAGNOSIS — R278 Other lack of coordination: Secondary | ICD-10-CM | POA: Diagnosis not present

## 2022-07-30 DIAGNOSIS — R482 Apraxia: Secondary | ICD-10-CM | POA: Diagnosis not present

## 2022-07-30 DIAGNOSIS — M6389 Disorders of muscle in diseases classified elsewhere, multiple sites: Secondary | ICD-10-CM | POA: Diagnosis not present

## 2022-07-30 DIAGNOSIS — R471 Dysarthria and anarthria: Secondary | ICD-10-CM | POA: Diagnosis not present

## 2022-07-30 DIAGNOSIS — I69351 Hemiplegia and hemiparesis following cerebral infarction affecting right dominant side: Secondary | ICD-10-CM | POA: Diagnosis not present

## 2022-07-30 DIAGNOSIS — I480 Paroxysmal atrial fibrillation: Secondary | ICD-10-CM | POA: Diagnosis not present

## 2022-07-30 DIAGNOSIS — I6602 Occlusion and stenosis of left middle cerebral artery: Secondary | ICD-10-CM | POA: Diagnosis not present

## 2022-07-30 DIAGNOSIS — I63312 Cerebral infarction due to thrombosis of left middle cerebral artery: Secondary | ICD-10-CM | POA: Diagnosis not present

## 2022-07-31 DIAGNOSIS — M6389 Disorders of muscle in diseases classified elsewhere, multiple sites: Secondary | ICD-10-CM | POA: Diagnosis not present

## 2022-07-31 DIAGNOSIS — I5032 Chronic diastolic (congestive) heart failure: Secondary | ICD-10-CM | POA: Diagnosis not present

## 2022-07-31 DIAGNOSIS — I69351 Hemiplegia and hemiparesis following cerebral infarction affecting right dominant side: Secondary | ICD-10-CM | POA: Diagnosis not present

## 2022-07-31 DIAGNOSIS — I63312 Cerebral infarction due to thrombosis of left middle cerebral artery: Secondary | ICD-10-CM | POA: Diagnosis not present

## 2022-07-31 DIAGNOSIS — R278 Other lack of coordination: Secondary | ICD-10-CM | POA: Diagnosis not present

## 2022-08-01 ENCOUNTER — Telehealth: Payer: Self-pay

## 2022-08-01 ENCOUNTER — Other Ambulatory Visit (HOSPITAL_COMMUNITY): Payer: Self-pay

## 2022-08-01 DIAGNOSIS — R278 Other lack of coordination: Secondary | ICD-10-CM | POA: Diagnosis not present

## 2022-08-01 DIAGNOSIS — J9611 Chronic respiratory failure with hypoxia: Secondary | ICD-10-CM | POA: Diagnosis not present

## 2022-08-01 DIAGNOSIS — I63312 Cerebral infarction due to thrombosis of left middle cerebral artery: Secondary | ICD-10-CM | POA: Diagnosis not present

## 2022-08-01 DIAGNOSIS — I5032 Chronic diastolic (congestive) heart failure: Secondary | ICD-10-CM | POA: Diagnosis not present

## 2022-08-01 DIAGNOSIS — I69351 Hemiplegia and hemiparesis following cerebral infarction affecting right dominant side: Secondary | ICD-10-CM | POA: Diagnosis not present

## 2022-08-01 DIAGNOSIS — M6389 Disorders of muscle in diseases classified elsewhere, multiple sites: Secondary | ICD-10-CM | POA: Diagnosis not present

## 2022-08-01 NOTE — Telephone Encounter (Signed)
     This was done via [employer] and [address]

## 2022-08-01 NOTE — Telephone Encounter (Addendum)
Benefit Verification [id number] Submitted!

## 2022-08-01 NOTE — Telephone Encounter (Signed)
PA approved via [employer] portal.

## 2022-08-02 DIAGNOSIS — R471 Dysarthria and anarthria: Secondary | ICD-10-CM | POA: Diagnosis not present

## 2022-08-02 DIAGNOSIS — I6602 Occlusion and stenosis of left middle cerebral artery: Secondary | ICD-10-CM | POA: Diagnosis not present

## 2022-08-02 DIAGNOSIS — R482 Apraxia: Secondary | ICD-10-CM | POA: Diagnosis not present

## 2022-08-02 DIAGNOSIS — I480 Paroxysmal atrial fibrillation: Secondary | ICD-10-CM | POA: Diagnosis not present

## 2022-08-05 ENCOUNTER — Other Ambulatory Visit (HOSPITAL_COMMUNITY): Payer: Self-pay

## 2022-08-05 DIAGNOSIS — I5032 Chronic diastolic (congestive) heart failure: Secondary | ICD-10-CM | POA: Diagnosis not present

## 2022-08-05 DIAGNOSIS — I63312 Cerebral infarction due to thrombosis of left middle cerebral artery: Secondary | ICD-10-CM | POA: Diagnosis not present

## 2022-08-05 DIAGNOSIS — I69351 Hemiplegia and hemiparesis following cerebral infarction affecting right dominant side: Secondary | ICD-10-CM | POA: Diagnosis not present

## 2022-08-05 DIAGNOSIS — M6389 Disorders of muscle in diseases classified elsewhere, multiple sites: Secondary | ICD-10-CM | POA: Diagnosis not present

## 2022-08-05 DIAGNOSIS — R278 Other lack of coordination: Secondary | ICD-10-CM | POA: Diagnosis not present

## 2022-08-06 ENCOUNTER — Encounter: Payer: Self-pay | Admitting: Orthopedic Surgery

## 2022-08-06 ENCOUNTER — Non-Acute Institutional Stay (SKILLED_NURSING_FACILITY): Payer: Medicare Other | Admitting: Orthopedic Surgery

## 2022-08-06 DIAGNOSIS — I480 Paroxysmal atrial fibrillation: Secondary | ICD-10-CM

## 2022-08-06 DIAGNOSIS — I6602 Occlusion and stenosis of left middle cerebral artery: Secondary | ICD-10-CM | POA: Diagnosis not present

## 2022-08-06 DIAGNOSIS — G8191 Hemiplegia, unspecified affecting right dominant side: Secondary | ICD-10-CM

## 2022-08-06 DIAGNOSIS — I2510 Atherosclerotic heart disease of native coronary artery without angina pectoris: Secondary | ICD-10-CM

## 2022-08-06 DIAGNOSIS — F5101 Primary insomnia: Secondary | ICD-10-CM

## 2022-08-06 DIAGNOSIS — I10 Essential (primary) hypertension: Secondary | ICD-10-CM

## 2022-08-06 DIAGNOSIS — E782 Mixed hyperlipidemia: Secondary | ICD-10-CM | POA: Diagnosis not present

## 2022-08-06 DIAGNOSIS — J9611 Chronic respiratory failure with hypoxia: Secondary | ICD-10-CM

## 2022-08-06 DIAGNOSIS — R482 Apraxia: Secondary | ICD-10-CM | POA: Diagnosis not present

## 2022-08-06 DIAGNOSIS — R278 Other lack of coordination: Secondary | ICD-10-CM | POA: Diagnosis not present

## 2022-08-06 DIAGNOSIS — K219 Gastro-esophageal reflux disease without esophagitis: Secondary | ICD-10-CM | POA: Diagnosis not present

## 2022-08-06 DIAGNOSIS — R471 Dysarthria and anarthria: Secondary | ICD-10-CM | POA: Diagnosis not present

## 2022-08-06 DIAGNOSIS — M6389 Disorders of muscle in diseases classified elsewhere, multiple sites: Secondary | ICD-10-CM | POA: Diagnosis not present

## 2022-08-06 DIAGNOSIS — I5032 Chronic diastolic (congestive) heart failure: Secondary | ICD-10-CM | POA: Diagnosis not present

## 2022-08-06 DIAGNOSIS — J90 Pleural effusion, not elsewhere classified: Secondary | ICD-10-CM

## 2022-08-06 DIAGNOSIS — L853 Xerosis cutis: Secondary | ICD-10-CM | POA: Diagnosis not present

## 2022-08-06 DIAGNOSIS — F331 Major depressive disorder, recurrent, moderate: Secondary | ICD-10-CM

## 2022-08-06 DIAGNOSIS — J439 Emphysema, unspecified: Secondary | ICD-10-CM | POA: Diagnosis not present

## 2022-08-06 DIAGNOSIS — I69351 Hemiplegia and hemiparesis following cerebral infarction affecting right dominant side: Secondary | ICD-10-CM | POA: Diagnosis not present

## 2022-08-06 DIAGNOSIS — I63312 Cerebral infarction due to thrombosis of left middle cerebral artery: Secondary | ICD-10-CM | POA: Diagnosis not present

## 2022-08-06 NOTE — Progress Notes (Signed)
Location:  [facility] Room Number: 116/A Place of Service:  SNF [phone]) Provider:  Octavia Heir, NP   [name], MD  Patient Care Team: [name], MD as PCP - General (Internal Medicine) [name], MD as PCP - Cardiology (Cardiology) Duke Salvia, MD as PCP - Electrophysiology (Cardiology) Shirlean Kelly, MD as Consulting Physician (Neurosurgery)  Extended Emergency Contact Information Primary Emergency Contact: Shands,[name] Address: [address]          [place] [phone] [name] Home Phone: [phone] Mobile Phone: [phone] Relation: Spouse Secondary Emergency Contact: Girdler,[name] Address: [address]          [place], [place] [address] [name] Mobile Phone: [phone] Relation: Son  Code Status:  Full code Goals of care: Advanced Directive information    07/05/2022   11:57 AM  Advanced Directives  Does Patient Have a Medical Advance Directive? Yes  Type of Advance Directive Living will  Does patient want to make changes to medical advance directive? No - Patient declined     Chief Complaint  Patient presents with   Medical Management of Chronic Issues    HPI:  Pt is a 77 y.o. female seen today for medical management of chronic diseases.    She currently resides on the skilled nursing unit at [facility]. 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    Dry skin- excoriated/ flaking skin to nose/ nostrils, denies itching, he is on humidified air Pulmonary emphysema- followed by pulmonary, remains on Respimat and duonebs prn Chronic respiratory failure with hypoxia- 03/22 sob> CXR revealed CHF with small right pleural effusion> resolved with furosemide 20 mg x 3 days, now on 2-3 liters continuous oxygen Right hemiplegia- acute left MCA  stroke with right sided weakness 11/2020, lives in skilled nursing, dependent with ADLs,  Botox injections 02/07, continues to wears braces to right side HTN- BUN/creat 31/0.9 (12/04), see trends below, remains on amlodipine HLD- total 182, LDL 89 (11/2021), statin intolerance, remains on Zetia PAF- TSH 0.29 04/29/2022, HR controlled with amiodarone, on Eliquis for clot prevention CHF- 2D echo LVEF 60-65% 06/2021, remains on furosemide prn CAD- remains on Zetia and Eliquis Depression- no mood changes, remains on Wellbutrin GERD- hgb 13.3 (12/04), remains on Protonix Insomnia- remains on Ambien prn   Recent blood pressure:  04/02- 123/68  03/26- 94/62  03/19- 112/65  Recent weights:  04/01- 98.2 lbs  03/07- 97.6 lbs  02/08- 101 lbs   Past Medical History:  Diagnosis Date   Anxiety    Arthritis    "some in my lower back; probably elbows, knees" (11/18/2017)   Atrial fibrillation    Bell's palsy    when pt. was 77 yrs old, when under stress the left side of face will droop.   Complication of anesthesia    "vascular OR 2016; BP bottomed out; couldn't get it regulated; ended up in ICU for DAYS" (11/18/2017)   GERD (gastroesophageal reflux disease)    History of kidney stones    Hypertension    Hypertrophic cardiomyopathy    severe LV basilar hypertrophy witn no evidence of significant outflow tract obstruction, EF 65-70%, mild LAE, mild TR, grade 1a diastolic dysfunction 05/15/10 (Dr. Donato Schultz) (Atrial Septal Hypertrophy pattern)-- Intra-op TEE with dsignificant outflow tract obstruction - AI, MR & TR   Insomnia  Mild aortic sclerosis    Osteopenia    Peripheral vascular disease    Syncope    , Vagal   Past Surgical History:  Procedure Laterality Date   AUGMENTATION MAMMAPLASTY Bilateral    BACK SURGERY     CARDIAC CATHETERIZATION N/A 05/07/2016   Procedure: Left Heart Cath and Coronary Angiography;  Surgeon: Kathleene Hazel, MD;  Location: [facility] INVASIVE CV LAB;   Service: Cardiovascular;  Laterality: N/A;   CARDIOVERSION N/A 09/24/2017   Procedure: CARDIOVERSION;  Surgeon: Laurey Morale, MD;  Location: [facility] ENDOSCOPY;  Service: Cardiovascular;  Laterality: N/A;   DILATION AND CURETTAGE OF UTERUS     ENDARTERECTOMY FEMORAL Right 03/02/2015   Procedure: ENDARTERECTOMY RIGHT FEMORAL;  Surgeon: Chuck Hint, MD;  Location: [facility] OR;  Service: Vascular;  Laterality: Right;   ESOPHAGOGASTRODUODENOSCOPY (EGD) WITH PROPOFOL N/A 12/01/2020   Procedure: ESOPHAGOGASTRODUODENOSCOPY (EGD) WITH PROPOFOL;  Surgeon: Diamantina Monks, MD;  Location: [facility] ENDOSCOPY;  Service: General;  Laterality: N/A;   FACIAL COSMETIC SURGERY Left 2002   "related to Bell's Palsy @ age 12; left eye/side of face droopy; tried to make area symmetrical"   FEMORAL-POPLITEAL BYPASS GRAFT Right 03/02/2015   Procedure: BYPASS GRAFT FEMORAL-BELOW KNEE POPLITEAL ARTERY;  Surgeon: Chuck Hint, MD;  Location: [facility] OR;  Service: Vascular;  Laterality: Right;   INGUINAL HERNIA REPAIR Bilateral 2002   IR ANGIO INTRA EXTRACRAN SEL COM CAROTID INNOMINATE UNI L MOD SED  11/16/2020   IR CT HEAD LTD  11/13/2020   IR PERCUTANEOUS ART THROMBECTOMY/INFUSION INTRACRANIAL INC DIAG ANGIO  11/13/2020   OVARIAN CYST REMOVAL Left    PEG PLACEMENT N/A 12/01/2020   Procedure: PERCUTANEOUS ENDOSCOPIC GASTROSTOMY (PEG) PLACEMENT;  Surgeon: Diamantina Monks, MD;  Location: [facility] ENDOSCOPY;  Service: General;  Laterality: N/A;   PERIPHERAL VASCULAR CATHETERIZATION N/A 01/16/2015   Procedure: Abdominal Aortogram;  Surgeon: Chuck Hint, MD;  Location: [facility] INVASIVE CV LAB;  Service: Cardiovascular;  Laterality: N/A;   POSTERIOR LUMBAR FUSION  2015   "have plates and screws in there"   RADIOLOGY WITH ANESTHESIA N/A 11/13/2020   Procedure: IR WITH ANESTHESIA;  Surgeon: Radiologist, Medication, MD;  Location: [facility] OR;  Service: Radiology;  Laterality: N/A;   TONSILLECTOMY      Allergies  Allergen Reactions    Amoxicillin Other (See Comments)    UTI Has patient had a PCN reaction causing immediate rash, facial/tongue/throat swelling, SOB or lightheadedness with hypotension: No Has patient had a PCN reaction causing severe rash involving mucus membranes or skin necrosis: No Has patient had a PCN reaction that required hospitalization: No Has patient had a PCN reaction occurring within the last 10 years: Yes--UTI ONLY If all of the above answers are "NO", then may proceed with Cephalosporin use.    Atenolol Cough   Crestor [Rosuvastatin Calcium] Other (See Comments)    Muscle aches   Pravastatin Other (See Comments)    Muscle aches   Rosuvastatin Calcium    Sulfa Antibiotics Nausea Only   Codeine Other (See Comments)    hallucinations    Outpatient Encounter Medications as of 08/06/2022  Medication Sig   acetaminophen (TYLENOL) 325 MG tablet Take 650 mg by mouth 2 (two) times daily.   aluminum-magnesium hydroxide-simethicone (MAALOX) 200-200-20 MG/5ML SUSP Take 30 mLs by mouth every 4 (four) hours as needed (heartburn).   amiodarone (PACERONE) 200 MG tablet Take 200 mg by mouth daily. May give crushed   amLODipine (NORVASC) 5 MG tablet Take 5 mg by  mouth daily.   apixaban (ELIQUIS) 5 MG TABS tablet Take 5 mg by mouth 2 (two) times daily. May give crushed   bisacodyl (DULCOLAX) 5 MG EC tablet Take 5 mg by mouth 3 (three) times daily as needed for moderate constipation.   botulinum toxin Type A (BOTOX) 100 units SOLR injection Inject 300 units into the muscles every 3 months.   buPROPion (WELLBUTRIN XL) 150 MG 24 hr tablet Take 150 mg by mouth daily.   diclofenac Sodium (VOLTAREN) 1 % GEL Apply one application to right hip and knee four times daily as needed.   ezetimibe (ZETIA) 10 MG tablet Take 10 mg by mouth daily. Oral if crushed   furosemide (LASIX) 20 MG tablet Take 20 mg by mouth daily as needed. Give Lasix  for weight gain equal or greater than 3 pounds   hydrALAZINE (APRESOLINE) 10  MG tablet Take 10 mg by mouth 3 (three) times daily as needed (SBP > 165 or DBP > 95).   ipratropium-albuterol (DUONEB) 0.5-2.5 (3) MG/3ML SOLN Inhale 3 mLs into the lungs every 4 (four) hours as needed.   ketoconazole (NIZORAL) 2 % cream Apply 1 application  topically daily as needed for irritation.   nystatin cream (MYCOSTATIN) Apply 1 application topically 2 (two) times daily as needed for dry skin (apply to perirectal rash).   ondansetron (ZOFRAN) 4 MG tablet Take 4 mg by mouth every 6 (six) hours as needed for nausea or vomiting.   OXYGEN Inhale 3 L into the lungs as directed. to maintain sats > or equal to 90%. May titrate Three Times A Day; 07:00 AM - 03:00 PM, 03:00 PM - 11:00 PM, 11:00 PM - 7:00 am   pantoprazole (PROTONIX) 40 MG tablet Take 40 mg by mouth 2 (two) times daily.   Polyethyl Glycol-Propyl Glycol (SYSTANE) 0.4-0.3 % SOLN Place 2 drops into both eyes 4 (four) times daily as needed (irritation).   polyethylene glycol powder (GLYCOLAX/MIRALAX) 17 GM/SCOOP powder Take 17 g by mouth daily.   promethazine (PHENERGAN) 12.5 MG suppository Place 12.5 mg rectally every 6 (six) hours as needed for nausea or vomiting.   Tiotropium Bromide-Olodaterol (STIOLTO RESPIMAT) 2.5-2.5 MCG/ACT AERS Inhale 2 puffs into the lungs daily.   zinc oxide 20 % ointment Apply 1 Application topically as needed for irritation.   Facility-Administered Encounter Medications as of 08/06/2022  Medication   botulinum toxin Type A (BOTOX) injection 100 Units    Review of Systems  Constitutional:  Negative for activity change and appetite change.  HENT:  Positive for trouble swallowing. Negative for sore throat.   Eyes:  Negative for visual disturbance.  Respiratory:  Positive for shortness of breath. Negative for cough and wheezing.   Cardiovascular:  Negative for chest pain and leg swelling.  Gastrointestinal:  Negative for abdominal distention, abdominal pain, constipation, diarrhea and nausea.   Genitourinary:  Negative for dysuria, flank pain, hematuria and vaginal bleeding.  Musculoskeletal:  Positive for arthralgias and gait problem.  Neurological:  Positive for weakness and numbness. Negative for dizziness and light-headedness.  Psychiatric/Behavioral:  Positive for dysphoric mood and sleep disturbance. The patient is nervous/anxious.     Immunization History  Administered Date(s) Administered   Fluad Quad(high Dose 65+) 02/05/2022   Influenza Split 01/16/2009, 04/30/2010, 01/26/2016, 02/07/2018, 02/01/2020   Influenza, High Dose Seasonal PF 02/07/2018, 01/25/2019, 02/28/2021, 02/05/2022   Influenza,inj,Quad PF,6+ Mos 03/06/2012, 01/26/2016, 01/10/2017   Influenza,inj,quad, With Preservative 02/14/2015   Moderna Covid-19 Vaccine Bivalent Booster 72yrs & up 02/28/2021  PFIZER(Purple Top)SARS-COV-2 Vaccination 05/06/2019, 05/27/2019, 01/27/2020, 09/18/2020   Pneumococcal Conjugate-13 03/19/2017   Pneumococcal Polysaccharide-23 02/14/2015   Respiratory Syncytial Virus Vaccine,Recomb Aduvanted(Arexvy) 02/15/2022   Td 02/25/2005   Tdap 05/19/2013   Zoster, Live 07/28/2006   Pertinent  Health Maintenance Due  Topic Date Due   INFLUENZA VACCINE  11/14/2022   DEXA SCAN  Discontinued      07/07/2021    7:20 PM 07/08/2021    9:00 AM 07/08/2021    9:30 PM 07/09/2021    8:00 AM 05/07/2022   11:59 AM  Fall Risk  Falls in the past year?     0  Was there an injury with Fall?     0  Fall Risk Category Calculator     0  (RETIRED) Patient Fall Risk Level High fall risk High fall risk High fall risk Moderate fall risk   Patient at Risk for Falls Due to     No Fall Risks  Fall risk Follow up     Falls evaluation completed   Functional Status Survey:    Vitals:   08/06/22 1359  BP: 123/68  Pulse: 61  Resp: 16  Temp: (!) 97.2 F (36.2 C)  SpO2: 98%  Weight: 98 lb 3.2 oz (44.5 kg)  Height: 5\' 3"  (1.6 m)   Body mass index is 17.4 kg/m. Physical Exam Vitals reviewed.   Constitutional:      General: She is not in acute distress. HENT:     Head: Normocephalic.     Right Ear: There is no impacted cerumen.     Left Ear: There is no impacted cerumen.     Nose: Nose normal.     Mouth/Throat:     Mouth: Mucous membranes are moist.  Eyes:     General:        Right eye: No discharge.        Left eye: No discharge.  Cardiovascular:     Rate and Rhythm: Normal rate and regular rhythm.     Pulses: Normal pulses.     Heart sounds: Normal heart sounds.  Pulmonary:     Effort: Pulmonary effort is normal. No respiratory distress.     Breath sounds: Normal breath sounds. No wheezing or rales.     Comments: 3 liters oxygen Abdominal:     General: Bowel sounds are normal. There is no distension.     Palpations: Abdomen is soft.     Tenderness: There is no abdominal tenderness.  Musculoskeletal:     Cervical back: Neck supple.     Right lower leg: No edema.     Left lower leg: No edema.  Skin:    General: Skin is warm and dry.     Capillary Refill: Capillary refill takes less than 2 seconds.     Comments: Excoriated skin to nose extending to upper lip  Neurological:     General: No focal deficit present.     Mental Status: She is alert and oriented to person, place, and time.     Comments: Right sided weakness, slowed speech, aphasia at times  Psychiatric:        Mood and Affect: Mood normal.        Behavior: Behavior normal.     Labs reviewed: Recent Labs    09/18/21 0000 12/03/21 0000 03/18/22 0500  NA 135* 139 140  K 5.1 4.4 4.3  CL 101 104 106  CO2 27* 27* 26*  BUN 32* 28* 31*  CREATININE 0.9  0.9 0.9  CALCIUM 8.8 9.0 8.9   Recent Labs    12/03/21 0000 12/10/21 0000 03/18/22 0500  AST 38* 32 33  ALT 48* 38* 39*  ALKPHOS 122 122 89  ALBUMIN 3.4* 3.5 3.4*   Recent Labs    03/18/22 0500  WBC 5.9  HGB 13.3  HCT 41  PLT 224   Lab Results  Component Value Date   TSH 0.55 06/18/2022   Lab Results  Component Value Date    HGBA1C 6.0 (H) 11/13/2020   Lab Results  Component Value Date   CHOL 182 11/22/2021   HDL 80 (A) 11/22/2021   LDLCALC 89 11/22/2021   TRIG 64 11/22/2021   CHOLHDL 3.0 11/13/2020    Significant Diagnostic Results in last 30 days:  No results found.  Assessment/Plan 1. Dry skin - excoriated skin to nose extending to upper lip - start humidifier qhs x 14 days - start Aquaphor cream BID x 14 days - start Vaseline - apply to nose after night dose of Aquaphor x 14 days - cont humidified air   2. Pulmonary emphysema, unspecified emphysema type - followed by pulmonary - cont Respimat and Duonebs prn  3. Chronic respiratory failure with hypoxia - 03/22 sob> CXR revealed CHF and small right pleural effusion> resolved with furosemide - cont continuous oxygen  4. 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 Essential hypertension - controlled with amlodipine and hydralazine prn  6. Mixed hyperlipidemia - statin intolerance - cont Zetia  7. PAF (paroxysmal atrial fibrillation) - HR< 100 - cont Eliquis - repeat TSH   8. Chronic diastolic congestive heart failure - compensated - cont furosemide prn  9. Coronary artery disease involving native coronary artery of native heart without angina pectoris - on Zetia and Eliquis  10. Moderate episode of recurrent major depressive disorder - no mood changes - good family support - cont Wellbutrin  11. Gastroesophageal reflux disease without esophagitis - hgb stable - cont Protonix  12. Primary insomnia - cont ambien  13. Pleural effusion - resolved with furosemide 20 mg x 3 days    Family/ staff Communication: plan discussed with patient and nurse  Labs/tests ordered:  repeat TSH

## 2022-08-08 ENCOUNTER — Ambulatory Visit: Payer: Medicare Other | Attending: Cardiology | Admitting: Cardiology

## 2022-08-08 ENCOUNTER — Encounter: Payer: Self-pay | Admitting: Cardiology

## 2022-08-08 VITALS — BP 98/56 | HR 50 | Ht 63.0 in | Wt 101.4 lb

## 2022-08-08 DIAGNOSIS — E039 Hypothyroidism, unspecified: Secondary | ICD-10-CM | POA: Diagnosis not present

## 2022-08-08 DIAGNOSIS — I480 Paroxysmal atrial fibrillation: Secondary | ICD-10-CM | POA: Diagnosis not present

## 2022-08-08 DIAGNOSIS — I421 Obstructive hypertrophic cardiomyopathy: Secondary | ICD-10-CM

## 2022-08-08 DIAGNOSIS — R471 Dysarthria and anarthria: Secondary | ICD-10-CM | POA: Diagnosis not present

## 2022-08-08 DIAGNOSIS — I10 Essential (primary) hypertension: Secondary | ICD-10-CM

## 2022-08-08 DIAGNOSIS — R482 Apraxia: Secondary | ICD-10-CM | POA: Diagnosis not present

## 2022-08-08 DIAGNOSIS — I6602 Occlusion and stenosis of left middle cerebral artery: Secondary | ICD-10-CM | POA: Diagnosis not present

## 2022-08-08 LAB — TSH: TSH: 0.61 (ref 0.41–5.90)

## 2022-08-08 NOTE — Patient Instructions (Signed)
Medication Instructions:  The current medical regimen is effective;  continue present plan and medications.  *If you need a refill on your cardiac medications before your next appointment, please call your pharmacy*  Follow-Up: At [facility], you and your health needs are our priority.  As part of our continuing mission to provide you with exceptional heart care, we have created designated Provider Care Teams.  These Care Teams include your primary Cardiologist (physician) and Advanced Practice Providers (APPs -  Physician Assistants and Nurse Practitioners) who all work together to provide you with the care you need, when you need it.  We recommend signing up for the patient portal called "MyChart".  Sign up information is provided on this After Visit Summary.  MyChart is used to connect with patients for Virtual Visits (Telemedicine).  Patients are able to view lab/test results, encounter notes, upcoming appointments, etc.  Non-urgent messages can be sent to your provider as well.   To learn more about what you can do with MyChart, go to [email or web address].    Your next appointment:   6 month(s)  Provider:   Mark Skains, MD     

## 2022-08-08 NOTE — Progress Notes (Signed)
Cardiology Office Note:    Date:  08/08/2022   ID:  Beverly Li, DOB 11-19-1945, MRN [id number]  PCP:  [name], MD   [facility] Providers Cardiologist:  Donato Schultz, MD Electrophysiologist:  Sherryl Manges, MD     Referring MD: [name], MD    History of Present Illness:    Beverly Li is a 77 y.o. female follow-up for hypertrophic obstructive cardiomyopathy, PAF on amiodarone, stroke.  In 2016 during femoropopliteal bypass she developed significant hypotension obstruction respiratory arrest was placed on phenylephrine intubated for several days then finally self extubated.  2018 underwent cardiac catheterization no CAD that was significant was found.  She had significant postprocedural hypertension and received hydralazine then became bradycardic and hypotensive again due to obstructive symptoms.  Received phenylephrine.  Did not require intubation.  She had atrial fibrillation which also inhibited her overall cardiac output given her obstructive symptoms.  She was previously taking dofetilide for this but now is on amiodarone followed by Dr. Graciela Husbands.  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  She had pneumonia.  Few weeks later had a second stroke.  In March 2023 she had bradycardia which improved following discontinuing of longstanding metoprolol.  We continued with the amiodarone.  Has been maintained on Eliquis.  Here with her husband again.  She is continuing therapy.  Has an electric wheelchair as well.  Denies any palpitations chest pain    Past Medical History:  Diagnosis Date   Anxiety    Arthritis    "some in my lower back; probably elbows, knees" (11/18/2017)   Atrial fibrillation    Bell's palsy    when pt. was 77 yrs old, when under stress the left side of face will droop.   Complication of anesthesia     "vascular OR 2016; BP bottomed out; couldn't get it regulated; ended up in ICU for DAYS" (11/18/2017)   GERD (gastroesophageal reflux disease)    History of kidney stones    Hypertension    Hypertrophic cardiomyopathy    severe LV basilar hypertrophy witn no evidence of significant outflow tract obstruction, EF 65-70%, mild LAE, mild TR, grade 1a diastolic dysfunction 05/15/10 (Dr. Donato Schultz) (Atrial Septal Hypertrophy pattern)-- Intra-op TEE with dsignificant outflow tract obstruction - AI, MR & TR   Insomnia    Mild aortic sclerosis    Osteopenia    Peripheral vascular disease    Syncope    , Vagal    Past Surgical History:  Procedure Laterality Date   AUGMENTATION MAMMAPLASTY Bilateral    BACK SURGERY     CARDIAC CATHETERIZATION N/A 05/07/2016   Procedure: Left Heart Cath and Coronary Angiography;  Surgeon: Kathleene Hazel, MD;  Location[facility] INVASIVE CV LAB;  Service: Cardiovascular;  Laterality: N/A;   CARDIOVERSION N/A 09/24/2017   Procedure: CARDIOVERSION;  Surgeon: Laurey Morale, MD;  Location: [facility] ENDOSCOPY;  Service: Cardiovascular;  Laterality: N/A;   DILATION AND CURETTAGE OF UTERUS     ENDARTERECTOMY FEMORAL Right 03/02/2015   Procedure: ENDARTERECTOMY RIGHT FEMORAL;  Surgeon: Chuck Hint, MD;  Location: [facility] OR;  Service: Vascular;  Laterality: Right;   ESOPHAGOGASTRODUODENOSCOPY (EGD) WITH PROPOFOL N/A 12/01/2020   Procedure: ESOPHAGOGASTRODUODENOSCOPY (EGD) WITH PROPOFOL;  Surgeon: Diamantina Monks, MD;  Location: [facility] ENDOSCOPY;  Service: General;  Laterality: N/A;   FACIAL COSMETIC SURGERY Left 2002   "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 66; left eye/side of face droopy; tried to make area symmetrical"   FEMORAL-POPLITEAL BYPASS GRAFT Right 03/02/2015   Procedure: BYPASS GRAFT FEMORAL-BELOW KNEE POPLITEAL ARTERY;  Surgeon: Chuck Hint, MD;  Location: [facility] OR;  Service: Vascular;  Laterality: Right;   INGUINAL HERNIA REPAIR Bilateral 2002   IR ANGIO INTRA  EXTRACRAN SEL COM CAROTID INNOMINATE UNI L MOD SED  11/16/2020   IR CT HEAD LTD  11/13/2020   IR PERCUTANEOUS ART THROMBECTOMY/INFUSION INTRACRANIAL INC DIAG ANGIO  11/13/2020   OVARIAN CYST REMOVAL Left    PEG PLACEMENT N/A 12/01/2020   Procedure: PERCUTANEOUS ENDOSCOPIC GASTROSTOMY (PEG) PLACEMENT;  Surgeon: Diamantina Monks, MD;  Location: [facility] ENDOSCOPY;  Service: General;  Laterality: N/A;   PERIPHERAL VASCULAR CATHETERIZATION N/A 01/16/2015   Procedure: Abdominal Aortogram;  Surgeon: Chuck Hint, MD;  Location: [facility] INVASIVE CV LAB;  Service: Cardiovascular;  Laterality: N/A;   POSTERIOR LUMBAR FUSION  2015   "have plates and screws in there"   RADIOLOGY WITH ANESTHESIA N/A 11/13/2020   Procedure: IR WITH ANESTHESIA;  Surgeon: Radiologist, Medication, MD;  Location: [facility] OR;  Service: Radiology;  Laterality: N/A;   TONSILLECTOMY      Current Medications: Current Meds  Medication Sig   acetaminophen (TYLENOL) 325 MG tablet Take 650 mg by mouth 2 (two) times daily.   aluminum-magnesium hydroxide-simethicone (MAALOX) 200-200-20 MG/5ML SUSP Take 30 mLs by mouth every 4 (four) hours as needed (heartburn).   amiodarone (PACERONE) 200 MG tablet Take 200 mg by mouth daily. May give crushed   amLODipine (NORVASC) 5 MG tablet Take 5 mg by mouth daily.   apixaban (ELIQUIS) 5 MG TABS tablet Take 5 mg by mouth 2 (two) times daily. May give crushed   bisacodyl (DULCOLAX) 5 MG EC tablet Take 5 mg by mouth 3 (three) times daily as needed for moderate constipation.   botulinum toxin Type A (BOTOX) 100 units SOLR injection Inject 300 units into the muscles every 3 months.   buPROPion (WELLBUTRIN XL) 150 MG 24 hr tablet Take 150 mg by mouth daily.   diclofenac Sodium (VOLTAREN) 1 % GEL Apply one application to right hip and knee four times daily as needed.   ezetimibe (ZETIA) 10 MG tablet Take 10 mg by mouth daily. Oral if crushed   furosemide (LASIX) 20 MG tablet Take 20 mg by mouth daily as needed. Give  Lasix 20mg  for weight gain equal or greater than 3 pounds   hydrALAZINE (APRESOLINE) 10 MG tablet Take 10 mg by mouth 3 (three) times daily as needed (SBP > 165 or DBP > 95).   ipratropium-albuterol (DUONEB) 0.5-2.5 (3) MG/3ML SOLN Inhale 3 mLs into the lungs every 4 (four) hours as needed.   ketoconazole (NIZORAL) 2 % cream Apply 1 application  topically daily as needed for irritation.   nystatin cream (MYCOSTATIN) Apply 1 application topically 2 (two) times daily as needed for dry skin (apply to perirectal rash).   ondansetron (ZOFRAN) 4 MG tablet Take 4 mg by mouth every 6 (six) hours as needed for nausea or vomiting.   OXYGEN Inhale 3 L into the lungs as directed. to maintain sats > or equal to 90%. May titrate Three Times A Day; 07:00 AM - 03:00 PM, 03:00 PM - 11:00 PM, 11:00 PM - 7:00 am   pantoprazole (PROTONIX) 40 MG tablet Take 40 mg by mouth 2 (two) times daily.   Polyethyl Glycol-Propyl Glycol (SYSTANE) 0.4-0.3 % SOLN Place 2 drops into  both eyes 4 (four) times daily as needed (irritation).   polyethylene glycol powder (GLYCOLAX/MIRALAX) 17 GM/SCOOP powder Take 17 g by mouth daily.   promethazine (PHENERGAN) 12.5 MG suppository Place 12.5 mg rectally every 6 (six) hours as needed for nausea or vomiting.   Tiotropium Bromide-Olodaterol (STIOLTO RESPIMAT) 2.5-2.5 MCG/ACT AERS Inhale 2 puffs into the lungs daily.   zinc oxide 20 % ointment Apply 1 Application topically as needed for irritation.   Current Facility-Administered Medications for the 08/08/22 encounter (Office Visit) with [name], MD  Medication   botulinum toxin Type A (BOTOX) injection 100 Units     Allergies:   Amoxicillin, Atenolol, Crestor [rosuvastatin calcium], Pravastatin, Rosuvastatin calcium, Sulfa antibiotics, and Codeine   Social History   Socioeconomic History   Marital status: Married    Spouse name: Optometrist   Number of children: Not on file   Years of education: Not on file   Highest education level:  Not on file  Occupational History   Not on file  Tobacco Use   Smoking status: Former    Packs/day: 1.00    Years: 50.00    Additional pack years: 0.00    Total pack years: 50.00    Types: Cigarettes    Quit date: 12/15/2014    Years since quitting: 7.6   Smokeless tobacco: Never  Vaping Use   Vaping Use: Never used  Substance and Sexual Activity   Alcohol use: Yes    Comment: 11/18/2017 "might have a couple glasses of wine/month; if that"   Drug use: Not on file   Sexual activity: Not Currently  Other Topics Concern   Not on file  Social History Narrative   11/21/21 living at [facility]   Social Determinants of Health   Financial Resource Strain: Not on file  Food Insecurity: Not on file  Transportation Needs: Not on file  Physical Activity: Not on file  Stress: Not on file  Social Connections: Not on file     Family History: The patient's family history includes Breast cancer in her sister; Cancer in her father, mother, and sister; Hypertension in her mother; Liver cancer in her mother; Lung cancer in her father.  ROS:   Please see the history of present illness.     All other systems reviewed and are negative.  EKGs/Labs/Other Studies Reviewed:    The following studies were reviewed today: Cardiac catheterization 2018: Mid RCA lesion, 20 %stenosed. 2nd Mrg lesion, 20 %stenosed. Prox Cx to Mid Cx lesion, 30 %stenosed. Mid LAD lesion, 60 %stenosed.   1. Mild to moderate non-obstructive CAD 2. The LAD is a large caliber vessel that reaches the apex. The mid LAD has a moderate eccentric stenosis just after the moderate caliber diagonal branch. Angiographically this appears to be moderate.  FFR of this lesion suggests the stenosis is not flow limiting. (FFR 0.82-0.85).  3. The RCA and Circumflex have minor disease.  4. Abnormal LV pressures c/w HOCM.    Recommendations: Medical management of moderate non-obstructive CAD.   Cardiac MRI 2018: 1) Hypertrophic  cardiomyopathy with basal septum measuring 21 mm and spade-like ventrical   2) Hyperenhancement in the basal septum and apex   3) No frank SAM but small LVOT with peak recorded gradient 11 mmHg suggest echo correlation   4) Tri-leaflet aortic valve area by planimetry 2.0 cm2   5) Normal aortic root   6) Moderate LAE   7) Mild MR   8) Normal RV   Echocardiogram 07/08/2021:  1. Left ventricular ejection fraction, by estimation, is 60 to 65%. Left  ventricular ejection fraction by 3D volume is 65 %. The left ventricle has  normal function. The left ventricle has no regional wall motion  abnormalities. There is moderate  asymmetric left ventricular hypertrophy of the basal-septal segment. Left  ventricular diastolic parameters are consistent with Grade II diastolic  dysfunction (pseudonormalization). Elevated left atrial pressure.   2. Right ventricular systolic function is normal. The right ventricular  size is normal. There is normal pulmonary artery systolic pressure. The  estimated right ventricular systolic pressure is 21.8 mmHg.   3. Left atrial size was moderately dilated.   4. The mitral valve is degenerative. Trivial mitral valve regurgitation.  No evidence of mitral stenosis. Moderate mitral annular calcification.   5. The aortic valve is tricuspid. Aortic valve regurgitation is moderate.  Aortic valve sclerosis/calcification is present, without any evidence of  aortic stenosis.   6. Pulmonic valve regurgitation is mild to moderate.   7. The inferior vena cava is normal in size with greater than 50%  respiratory variability, suggesting right atrial pressure of 3 mmHg.   Long-term monitor 07/31/2021: Paroxysmal atrial fibrillation, 44% burden. Longest episode lasted 5 days with an average heart rate of 88 bpm. No pauses Predominant underlying rhythm was sinus 74 bpm. Rare PACs PVCs No significant prolonged episodes of bradycardia.    EKG:  EKG is  ordered today.  The  ekg ordered today demonstrates sinus rhythm first-degree AV block 66 bpm with left atrial enlargement left axis deviation/left anterior fascicular block with nonspecific ST-T wave changes.  No significant change from prior. Sinus rhythm 74 left axis deviation left atrial enlargement Recent Labs: 03/18/2022: ALT 39; BUN 31; Creatinine 0.9; Hemoglobin 13.3; Platelets 224; Potassium 4.3; Sodium 140 06/18/2022: TSH 0.55  Recent Lipid Panel    Component Value Date/Time   CHOL 182 11/22/2021 0600   CHOL 211 (H) 09/16/2016 1200   TRIG 64 11/22/2021 0600   HDL 80 (A) 11/22/2021 0600   HDL 97 09/16/2016 1200   CHOLHDL 3.0 11/13/2020 1930   VLDL 19 11/13/2020 1930   LDLCALC 89 11/22/2021 0600   LDLCALC 98 09/16/2016 1200     Risk Assessment/Calculations:              Physical Exam:    VS:  BP (!) 98/56   Pulse (!) 50   Ht 5\' 3"  (1.6 m)   Wt 101 lb 6.4 oz (46 kg)   LMP  (LMP Unknown)   SpO2 98%   BMI 17.96 kg/m     Wt Readings from Last 3 Encounters:  08/08/22 101 lb 6.4 oz (46 kg)  08/06/22 98 lb 3.2 oz (44.5 kg)  07/05/22 97 lb 9.6 oz (44.3 kg)     GEN: In wheelchair well developed in no acute distress HEENT: Normal, wearing O2.  NECK: No JVD; No carotid bruits LYMPHATICS: No lymphadenopathy CARDIAC: RRR, no significant murmur, no rubs, gallops RESPIRATORY:  Clear to auscultation without rales, wheezing or rhonchi  ABDOMEN: Soft, non-tender, non-distended MUSCULOSKELETAL:  No edema; minor contractures SKIN: Warm and dry NEUROLOGIC:  Alert and oriented x 3, speech improving PSYCHIATRIC:  Normal affect   ASSESSMENT:    1. HOCM (hypertrophic obstructive cardiomyopathy)   2. Paroxysmal atrial fibrillation   3. Essential hypertension     PLAN:    In order of problems listed above:  Hypertrophic obstructive cardiomyopathy - See note above for details.  Continue with amiodarone for rhythm control  of atrial fibrillation.  No metoprolol.  Doing well.  Stable.  Paroxysmal atrial fibrillation - Excellent on amiodarone.  Maintaining sinus rhythm.  Had had paroxysmal atrial fibrillation on long-term monitor with rates in the 80s.  Well-controlled.  Currently in sinus rhythm.  Excellent. TSH normal, LFT within limits.   Chronic anticoagulation - Continue with Eliquis. Prevention  History of stroke 11/2020 - Right Hemiparesis.  At [facility].  Was able to complete some physical therapy walking exercises at [facility]          Medication Adjustments/Labs and Tests Ordered: Current medicines are reviewed at length with the patient today.  Concerns regarding medicines are outlined above.  No orders of the defined types were placed in this encounter.  No orders of the defined types were placed in this encounter.   Patient Instructions  Medication Instructions:  The current medical regimen is effective;  continue present plan and medications.  *If you need a refill on your cardiac medications before your next appointment, please call your pharmacy*  Follow-Up: At [facility], you and your health needs are our priority.  As part of our continuing mission to provide you with exceptional heart care, we have created designated Provider Care Teams.  These Care Teams include your primary Cardiologist (physician) and Advanced Practice Providers (APPs -  Physician Assistants and Nurse Practitioners) who all work together to provide you with the care you need, when you need it.  We recommend signing up for the patient portal called "MyChart".  Sign up information is provided on this After Visit Summary.  MyChart is used to connect with patients for Virtual Visits (Telemedicine).  Patients are able to view lab/test results, encounter notes, upcoming appointments, etc.  Non-urgent messages can be sent to your provider as well.   To learn more about what you can do with MyChart, go to [email or web address].    Your next appointment:   6  month(s)  Provider:   Donato Schultz, MD        Signed, Donato Schultz, MD  08/08/2022 3:22 PM    [facility]

## 2022-08-09 DIAGNOSIS — I63312 Cerebral infarction due to thrombosis of left middle cerebral artery: Secondary | ICD-10-CM | POA: Diagnosis not present

## 2022-08-09 DIAGNOSIS — R278 Other lack of coordination: Secondary | ICD-10-CM | POA: Diagnosis not present

## 2022-08-09 DIAGNOSIS — I5032 Chronic diastolic (congestive) heart failure: Secondary | ICD-10-CM | POA: Diagnosis not present

## 2022-08-09 DIAGNOSIS — I69351 Hemiplegia and hemiparesis following cerebral infarction affecting right dominant side: Secondary | ICD-10-CM | POA: Diagnosis not present

## 2022-08-09 DIAGNOSIS — M6389 Disorders of muscle in diseases classified elsewhere, multiple sites: Secondary | ICD-10-CM | POA: Diagnosis not present

## 2022-08-12 DIAGNOSIS — I5032 Chronic diastolic (congestive) heart failure: Secondary | ICD-10-CM | POA: Diagnosis not present

## 2022-08-12 DIAGNOSIS — I69351 Hemiplegia and hemiparesis following cerebral infarction affecting right dominant side: Secondary | ICD-10-CM | POA: Diagnosis not present

## 2022-08-12 DIAGNOSIS — R278 Other lack of coordination: Secondary | ICD-10-CM | POA: Diagnosis not present

## 2022-08-12 DIAGNOSIS — M6389 Disorders of muscle in diseases classified elsewhere, multiple sites: Secondary | ICD-10-CM | POA: Diagnosis not present

## 2022-08-12 DIAGNOSIS — I63312 Cerebral infarction due to thrombosis of left middle cerebral artery: Secondary | ICD-10-CM | POA: Diagnosis not present

## 2022-08-13 ENCOUNTER — Encounter: Payer: Self-pay | Admitting: Orthopedic Surgery

## 2022-08-13 ENCOUNTER — Non-Acute Institutional Stay (INDEPENDENT_AMBULATORY_CARE_PROVIDER_SITE_OTHER): Payer: Medicare Other | Admitting: Orthopedic Surgery

## 2022-08-13 DIAGNOSIS — R482 Apraxia: Secondary | ICD-10-CM | POA: Diagnosis not present

## 2022-08-13 DIAGNOSIS — I6602 Occlusion and stenosis of left middle cerebral artery: Secondary | ICD-10-CM | POA: Diagnosis not present

## 2022-08-13 DIAGNOSIS — R278 Other lack of coordination: Secondary | ICD-10-CM | POA: Diagnosis not present

## 2022-08-13 DIAGNOSIS — M6389 Disorders of muscle in diseases classified elsewhere, multiple sites: Secondary | ICD-10-CM | POA: Diagnosis not present

## 2022-08-13 DIAGNOSIS — Z0000 Encounter for general adult medical examination without abnormal findings: Secondary | ICD-10-CM

## 2022-08-13 DIAGNOSIS — I5032 Chronic diastolic (congestive) heart failure: Secondary | ICD-10-CM | POA: Diagnosis not present

## 2022-08-13 DIAGNOSIS — R471 Dysarthria and anarthria: Secondary | ICD-10-CM | POA: Diagnosis not present

## 2022-08-13 DIAGNOSIS — I480 Paroxysmal atrial fibrillation: Secondary | ICD-10-CM | POA: Diagnosis not present

## 2022-08-13 DIAGNOSIS — I69351 Hemiplegia and hemiparesis following cerebral infarction affecting right dominant side: Secondary | ICD-10-CM | POA: Diagnosis not present

## 2022-08-13 DIAGNOSIS — I63312 Cerebral infarction due to thrombosis of left middle cerebral artery: Secondary | ICD-10-CM | POA: Diagnosis not present

## 2022-08-13 NOTE — Patient Instructions (Signed)
  Beverly Li , Thank you for taking time to come for your Medicare Wellness Visit. I appreciate your ongoing commitment to your health goals. Please review the following plan we discussed and let me know if I can assist you in the future.   These are the goals we discussed:  Goals      Maintain Mobility and Function     Evidence-based guidance:  Acknowledge and validate impact of pain, loss of strength and potential disfigurement (hand osteoarthritis) on mental health and daily life, such as social isolation, anxiety, depression, impaired sexual relationship and   injury from falls.  Anticipate referral to physical or occupational therapy for assessment, therapeutic exercise and recommendation for adaptive equipment or assistive devices; encourage participation.  Assess impact on ability to perform activities of daily living, as well as engage in sports and leisure events or requirements of work or school.  Provide anticipatory guidance and reassurance about the benefit of exercise to maintain function; acknowledge and normalize fear that exercise may worsen symptoms.  Encourage regular exercise, at least 10 minutes at a time for 45 minutes per week; consider yoga, water exercise and proprioceptive exercises; encourage use of wearable activity tracker to increase motivation and adherence.  Encourage maintenance or resumption of daily activities, including employment, as pain allows and with minimal exposure to trauma.  Assist patient to advocate for adaptations to the work environment.  Consider level of pain and function, gender, age, lifestyle, patient preference, quality of life, readiness and ?ocapacity to benefit? when recommending patients for orthopaedic surgery consultation.  Explore strategies, such as changes to medication regimen or activity that enables patient to anticipate and manage flare-ups that increase deconditioning and disability.  Explore patient preferences; encourage  exposure to a broader range of activities that have been avoided for fear of experiencing pain.  Identify barriers to participation in therapy or exercise, such as pain with activity, anticipated or imagined pain.  Monitor postoperative joint replacement or any preexisting joint replacement for ongoing pain and loss of function; provide social support and encouragement throughout recovery.   Notes:         This is a list of the screening recommended for you and due dates:  Health Maintenance  Topic Date Due   Zoster (Shingles) Vaccine (1 of 2) 10/05/2022*   COVID-19 Vaccine (6 - 2023-24 season) 12/13/2022*   Screening for Lung Cancer  07/05/2023*   Flu Shot  11/14/2022   DTaP/Tdap/Td vaccine (3 - Td or Tdap) 05/20/2023   Medicare Annual Wellness Visit  08/13/2023   Pneumonia Vaccine  Completed   Hepatitis C Screening: USPSTF Recommendation to screen - Ages 18-79 yo.  Completed   HPV Vaccine  Aged Out   DEXA scan (bone density measurement)  Discontinued  *Topic was postponed. The date shown is not the original due date.

## 2022-08-13 NOTE — Progress Notes (Signed)
Subjective:   Beverly Li is a 77 y.o. female who presents for Medicare Annual (Subsequent) preventive examination.  Place of Service: [facility] skilled nursing unit Provider: Hazle Nordmann, AGNP-C   Review of Systems     Cardiac Risk Factors include: advanced age (>87men, >35 women);hypertension;dyslipidemia;sedentary lifestyle     Objective:    Today's Vitals   08/13/22 1047  BP: 123/68  Pulse: 61  Resp: 18  Temp: 98 F (36.7 C)  SpO2: 95%  Weight: 97 lb 9.6 oz (44.3 kg)  Height: 5\' 3"  (1.6 m)   Body mass index is 17.29 kg/m.     08/13/2022   11:04 AM 07/05/2022   11:57 AM 06/17/2022   11:38 AM 06/04/2022   10:35 AM 05/07/2022   12:00 PM 11/27/2021    4:47 PM 11/05/2021    9:58 AM  Advanced Directives  Does Patient Have a Medical Advance Directive? Yes Yes Yes Yes Yes No Yes  Type of Advance Directive Living will Living will Living will Living will Living will  Living will  Does patient want to make changes to medical advance directive? No - Patient declined No - Patient declined No - Patient declined No - Patient declined No - Patient declined No - Patient declined No - Patient declined  Copy of Healthcare Power of Attorney in Chart?     No - copy requested      Current Medications (verified) Outpatient Encounter Medications as of 08/13/2022  Medication Sig   acetaminophen (TYLENOL) 325 MG tablet Take 650 mg by mouth every 6 (six) hours.   aluminum-magnesium hydroxide-simethicone (MAALOX) 200-200-20 MG/5ML SUSP Take 30 mLs by mouth every 4 (four) hours as needed (heartburn).   amiodarone (PACERONE) 200 MG tablet Take 200 mg by mouth daily. May give crushed   amLODipine (NORVASC) 5 MG tablet Take 5 mg by mouth daily.   apixaban (ELIQUIS) 5 MG TABS tablet Take 5 mg by mouth 2 (two) times daily. May give crushed   bisacodyl (DULCOLAX) 5 MG EC tablet Take 5 mg by mouth 3 (three) times daily as needed for moderate constipation.   buPROPion (WELLBUTRIN XL) 150 MG 24 hr  tablet Take 150 mg by mouth daily.   diclofenac Sodium (VOLTAREN) 1 % GEL Apply one application to right hip and knee four times daily as needed.   Emollient (AQUAPHOR ADV THERAPY HEALING) 41 % OINT Apply 1 Application topically every evening.   Emollient (AQUAPHOR ADV THERAPY HEALING) 41 % OINT Apply 1 Application topically every morning.   ezetimibe (ZETIA) 10 MG tablet Take 10 mg by mouth daily. Oral if crushed   furosemide (LASIX) 20 MG tablet Take 20 mg by mouth daily as needed. Give Lasix 20mg  for weight gain equal or greater than 3 pounds   hydrALAZINE (APRESOLINE) 10 MG tablet Take 10 mg by mouth 3 (three) times daily as needed (SBP > 165 or DBP > 95).   ipratropium-albuterol (DUONEB) 0.5-2.5 (3) MG/3ML SOLN Inhale 3 mLs into the lungs every 4 (four) hours as needed.   ketoconazole (NIZORAL) 2 % cream Apply 1 application  topically daily as needed for irritation.   nystatin cream (MYCOSTATIN) Apply 1 application topically 2 (two) times daily as needed for dry skin (apply to perirectal rash).   ondansetron (ZOFRAN) 4 MG tablet Take 4 mg by mouth every 6 (six) hours as needed for nausea or vomiting.   OXYGEN Inhale 3 L into the lungs as directed. to maintain sats > or equal to 90%.  May titrate Three Times A Day; 07:00 AM - 03:00 PM, 03:00 PM - 11:00 PM, 11:00 PM - 7:00 am   pantoprazole (PROTONIX) 40 MG tablet Take 40 mg by mouth 2 (two) times daily.   Polyethyl Glycol-Propyl Glycol (SYSTANE) 0.4-0.3 % SOLN Place 2 drops into both eyes 4 (four) times daily as needed (irritation).   polyethylene glycol powder (GLYCOLAX/MIRALAX) 17 GM/SCOOP powder Take 17 g by mouth daily.   promethazine (PHENERGAN) 12.5 MG suppository Place 12.5 mg rectally every 6 (six) hours as needed for nausea or vomiting.   Tiotropium Bromide-Olodaterol (STIOLTO RESPIMAT) 2.5-2.5 MCG/ACT AERS Inhale 2 puffs into the lungs daily.   zinc oxide 20 % ointment Apply 1 Application topically as needed for irritation.    botulinum toxin Type A (BOTOX) 100 units SOLR injection Inject 300 units into the muscles every 3 months.   Facility-Administered Encounter Medications as of 08/13/2022  Medication   botulinum toxin Type A (BOTOX) injection 100 Units    Allergies (verified) Amoxicillin, Atenolol, Crestor [rosuvastatin calcium], Pravastatin, Rosuvastatin calcium, Sulfa antibiotics, and Codeine   History: Past Medical History:  Diagnosis Date   Anxiety    Arthritis    "some in my lower back; probably elbows, knees" (11/18/2017)   Atrial fibrillation (HCC)    Bell's palsy    when pt. was 77 yrs old, when under stress the left side of face will droop.   Complication of anesthesia    "vascular OR 2016; BP bottomed out; couldn't get it regulated; ended up in ICU for DAYS" (11/18/2017)   GERD (gastroesophageal reflux disease)    History of kidney stones    Hypertension    Hypertrophic cardiomyopathy (HCC)    severe LV basilar hypertrophy witn no evidence of significant outflow tract obstruction, EF 65-70%, mild LAE, mild TR, grade 1a diastolic dysfunction 05/15/10 (Dr. Donato Schultz) (Atrial Septal Hypertrophy pattern)-- Intra-op TEE with dsignificant outflow tract obstruction - AI, MR & TR   Insomnia    Mild aortic sclerosis    Osteopenia    Peripheral vascular disease (HCC)    Syncope    , Vagal   Past Surgical History:  Procedure Laterality Date   AUGMENTATION MAMMAPLASTY Bilateral    BACK SURGERY     CARDIAC CATHETERIZATION N/A 05/07/2016   Procedure: Left Heart Cath and Coronary Angiography;  Surgeon: Kathleene Hazel, MD;  Location: [facility] INVASIVE CV LAB;  Service: Cardiovascular;  Laterality: N/A;   CARDIOVERSION N/A 09/24/2017   Procedure: CARDIOVERSION;  Surgeon: Laurey Morale, MD;  Location: [facility] ENDOSCOPY;  Service: Cardiovascular;  Laterality: N/A;   DILATION AND CURETTAGE OF UTERUS     ENDARTERECTOMY FEMORAL Right 03/02/2015   Procedure: ENDARTERECTOMY RIGHT FEMORAL;  Surgeon: Chuck Hint, MD;  Location: [facility] OR;  Service: Vascular;  Laterality: Right;   ESOPHAGOGASTRODUODENOSCOPY (EGD) WITH PROPOFOL N/A 12/01/2020   Procedure: ESOPHAGOGASTRODUODENOSCOPY (EGD) WITH PROPOFOL;  Surgeon: Diamantina Monks, MD;  Location: [facility] ENDOSCOPY;  Service: General;  Laterality: N/A;   FACIAL COSMETIC SURGERY Left 2002   "related to Bell's Palsy @ age 24; left eye/side of face droopy; tried to make area symmetrical"   FEMORAL-POPLITEAL BYPASS GRAFT Right 03/02/2015   Procedure: BYPASS GRAFT FEMORAL-BELOW KNEE POPLITEAL ARTERY;  Surgeon: Chuck Hint, MD;  Location: [facility] OR;  Service: Vascular;  Laterality: Right;   INGUINAL HERNIA REPAIR Bilateral 2002   IR ANGIO INTRA EXTRACRAN SEL COM CAROTID INNOMINATE UNI L MOD SED  11/16/2020   IR CT HEAD LTD  11/13/2020  IR PERCUTANEOUS ART THROMBECTOMY/INFUSION INTRACRANIAL INC DIAG ANGIO  11/13/2020   OVARIAN CYST REMOVAL Left    PEG PLACEMENT N/A 12/01/2020   Procedure: PERCUTANEOUS ENDOSCOPIC GASTROSTOMY (PEG) PLACEMENT;  Surgeon: Diamantina Monks, MD;  Location: [facility] ENDOSCOPY;  Service: General;  Laterality: N/A;   PERIPHERAL VASCULAR CATHETERIZATION N/A 01/16/2015   Procedure: Abdominal Aortogram;  Surgeon: Chuck Hint, MD;  Location: [facility] INVASIVE CV LAB;  Service: Cardiovascular;  Laterality: N/A;   POSTERIOR LUMBAR FUSION  2015   "have plates and screws in there"   RADIOLOGY WITH ANESTHESIA N/A 11/13/2020   Procedure: IR WITH ANESTHESIA;  Surgeon: Radiologist, Medication, MD;  Location: [facility] OR;  Service: Radiology;  Laterality: N/A;   TONSILLECTOMY     Family History  Problem Relation Age of Onset   Liver cancer Mother    Cancer Mother        Liver   Hypertension Mother    Lung cancer Father    Cancer Father        Lung   Breast cancer Sister    Cancer Sister        Breast   Social History   Socioeconomic History   Marital status: Married    Spouse name: [name]   Number of children: Not on file   Years of education: Not  on file   Highest education level: Not on file  Occupational History   Not on file  Tobacco Use   Smoking status: Former    Packs/day: 1.00    Years: 50.00    Additional pack years: 0.00    Total pack years: 50.00    Types: Cigarettes    Quit date: 12/15/2014    Years since quitting: 7.6   Smokeless tobacco: Never  Vaping Use   Vaping Use: Never used  Substance and Sexual Activity   Alcohol use: Yes    Comment: 11/18/2017 "might have a couple glasses of wine/month; if that"   Drug use: Not on file   Sexual activity: Not Currently  Other Topics Concern   Not on file  Social History Narrative   11/21/21 living at [facility]   Social Determinants of Health   Financial Resource Strain: Low Risk  (08/13/2022)   Overall Financial Resource Strain (CARDIA)    Difficulty of Paying Living Expenses: Not hard at all  Food Insecurity: No Food Insecurity (08/13/2022)   Hunger Vital Sign    Worried About Running Out of Food in the Last Year: Never true    Ran Out of Food in the Last Year: Never true  Transportation Needs: No Transportation Needs (08/13/2022)   PRAPARE - Administrator, Civil Service (Medical): No    Lack of Transportation (Non-Medical): No  Physical Activity: Insufficiently Active (08/13/2022)   Exercise Vital Sign    Days of Exercise per Week: 3 days    Minutes of Exercise per Session: 30 min  Stress: No Stress Concern Present (08/13/2022)   [employer] of Occupational Health - Occupational Stress Questionnaire    Feeling of Stress : Only a little  Social Connections: Moderately Isolated (08/13/2022)   Social Connection and Isolation Panel [NHANES]    Frequency of Communication with Friends and Family: Patient unable to answer    Frequency of Social Gatherings with Friends and Family: More than three times a week    Attends Religious Services: Never    Database administrator or Organizations: No    Attends Banker Meetings: Never  Marital Status: Married    Tobacco Counseling Counseling given: Not Answered   Clinical Intake:  Pre-visit preparation completed: No  Pain : No/denies pain     BMI - recorded: 17.29 Nutritional Status: BMI <19  Underweight Nutritional Risks: None Diabetes: No  How often do you need to have someone help you when you read instructions, pamphlets, or other written materials from your doctor or pharmacy?: 3 - Sometimes What is the last grade level you completed in school?: some college  Diabetic?No  Interpreter Needed?: No      Activities of Daily Living    08/13/2022   11:03 AM  In your present state of health, do you have any difficulty performing the following activities:  Hearing? 0  Vision? 0  Difficulty concentrating or making decisions? 1  Walking or climbing stairs? 1  Dressing or bathing? 1  Doing errands, shopping? 1  Preparing Food and eating ? Y  Using the Toilet? Y  In the past six months, have you accidently leaked urine? Y  Do you have problems with loss of bowel control? Y  Managing your Medications? Y  Managing your Finances? Y  Housekeeping or managing your Housekeeping? Y    Patient Care Team: [name], MD as PCP - General (Internal Medicine) [name], MD as PCP - Cardiology (Cardiology) Duke Salvia, MD as PCP - Electrophysiology (Cardiology) Shirlean Kelly, MD as Consulting Physician (Neurosurgery)  Indicate any recent Medical Services you may have received from other than [facility] providers in the past year (date may be approximate).     Assessment:   This is a routine wellness examination for Jaquelyn.  Hearing/Vision screen No results found.  Dietary issues and exercise activities discussed: Current Exercise Habits: Home exercise routine (enrolled in PT> 2-3x weekly), Type of exercise: walking, Time (Minutes): 30, Frequency (Times/Week): 3, Weekly Exercise (Minutes/Week): 90, Intensity: Mild, Exercise limited by:  neurologic condition(s);cardiac condition(s);respiratory conditions(s) (h/o CVA, right sided hemiparesis)   Goals Addressed             This Visit's Progress    Maintain Mobility and Function   Not on track    Evidence-based guidance:  Acknowledge and validate impact of pain, loss of strength and potential disfigurement (hand osteoarthritis) on mental health and daily life, such as social isolation, anxiety, depression, impaired sexual relationship and   injury from falls.  Anticipate referral to physical or occupational therapy for assessment, therapeutic exercise and recommendation for adaptive equipment or assistive devices; encourage participation.  Assess impact on ability to perform activities of daily living, as well as engage in sports and leisure events or requirements of work or school.  Provide anticipatory guidance and reassurance about the benefit of exercise to maintain function; acknowledge and normalize fear that exercise may worsen symptoms.  Encourage regular exercise, at least 10 minutes at a time for 45 minutes per week; consider yoga, water exercise and proprioceptive exercises; encourage use of wearable activity tracker to increase motivation and adherence.  Encourage maintenance or resumption of daily activities, including employment, as pain allows and with minimal exposure to trauma.  Assist patient to advocate for adaptations to the work environment.  Consider level of pain and function, gender, age, lifestyle, patient preference, quality of life, readiness and ?ocapacity to benefit? when recommending patients for orthopaedic surgery consultation.  Explore strategies, such as changes to medication regimen or activity that enables patient to anticipate and manage flare-ups that increase deconditioning and disability.  Explore patient preferences; encourage  exposure to a broader range of activities that have been avoided for fear of experiencing pain.  Identify  barriers to participation in therapy or exercise, such as pain with activity, anticipated or imagined pain.  Monitor postoperative joint replacement or any preexisting joint replacement for ongoing pain and loss of function; provide social support and encouragement throughout recovery.   Notes:        Depression Screen    05/07/2022   11:59 AM 11/02/2018    3:47 PM  PHQ 2/9 Scores  PHQ - 2 Score 0 0    Fall Risk    08/13/2022   11:02 AM 05/07/2022   11:59 AM 11/02/2018    3:47 PM 09/03/2018   11:43 AM  Fall Risk   Falls in the past year? 1 0 0 1  Number falls in past yr: 0 0 0 0  Injury with Fall? 0 0 0 1  Risk for fall due to : History of fall(s);Impaired balance/gait;Impaired mobility No Fall Risks  History of fall(s)  Risk for fall due to: Comment right sided hemiparesis     Follow up Falls evaluation completed;Education provided;Falls prevention discussed Falls evaluation completed  Falls evaluation completed    FALL RISK PREVENTION PERTAINING TO THE HOME:  Any stairs in or around the home? No  If so, are there any without handrails? No  Home free of loose throw rugs in walkways, pet beds, electrical cords, etc? Yes  Adequate lighting in your home to reduce risk of falls? Yes   ASSISTIVE DEVICES UTILIZED TO PREVENT FALLS:  Life alert? No  Use of a cane, walker or w/c? Yes  Grab bars in the bathroom? Yes  Shower chair or bench in shower? Yes  Elevated toilet seat or a handicapped toilet? Yes   TIMED UP AND GO:  Was the test performed? No .  Length of time to ambulate 10 feet: N/A sec.   Gait unsteady without use of assistive device, provider informed and interventions were implemented  Cognitive Function:    08/13/2022   10:53 AM  MMSE - Mini Mental State Exam  Orientation to time 5  Orientation to Place 5  Registration 3  Attention/ Calculation 5  Recall 2  Language- name 2 objects 2  Language- repeat 0  Language- follow 3 step command 3  Language- read  & follow direction 1  Write a sentence 0  Copy design 1  Total score 27        Immunizations Immunization History  Administered Date(s) Administered   Fluad Quad(high Dose 65+) 02/05/2022   Influenza Split 01/16/2009, 04/30/2010, 01/26/2016, 02/07/2018, 02/01/2020   Influenza, High Dose Seasonal PF 02/07/2018, 01/25/2019, 02/28/2021, 02/05/2022   Influenza,inj,Quad PF,6+ Mos 03/06/2012, 01/26/2016, 01/10/2017   Influenza,inj,quad, With Preservative 02/14/2015   Moderna Covid-19 Vaccine Bivalent Booster 54yrs & up 02/28/2021   PFIZER(Purple Top)SARS-COV-2 Vaccination 05/06/2019, 05/27/2019, 01/27/2020, 09/18/2020   Pneumococcal Conjugate-13 03/19/2017   Pneumococcal Polysaccharide-23 02/14/2015   Respiratory Syncytial Virus Vaccine,Recomb Aduvanted(Arexvy) 02/15/2022   Td 02/25/2005   Tdap 05/19/2013   Zoster, Live 07/28/2006    TDAP status: Up to date  Flu Vaccine status: Up to date  Pneumococcal vaccine status: Up to date  Covid-19 vaccine status: Completed vaccines  Qualifies for Shingles Vaccine? Yes   Zostavax completed Yes   Shingrix Completed?: No.    Education has been provided regarding the importance of this vaccine. Patient has been advised to call insurance company to determine out of pocket expense if they have not  yet received this vaccine. Advised may also receive vaccine at local pharmacy or Health Dept. Verbalized acceptance and understanding.  Screening Tests Health Maintenance  Topic Date Due   Zoster Vaccines- Shingrix (1 of 2) 10/05/2022 (Originally 04/24/1964)   COVID-19 Vaccine (6 - 2023-24 season) 12/13/2022 (Originally 12/14/2021)   Lung Cancer Screening  07/05/2023 (Originally 12/05/2021)   INFLUENZA VACCINE  11/14/2022   DTaP/Tdap/Td (3 - Td or Tdap) 05/20/2023   Medicare Annual Wellness (AWV)  08/13/2023   Pneumonia Vaccine 50+ Years old  Completed   Hepatitis C Screening  Completed   HPV VACCINES  Aged Out   DEXA SCAN  Discontinued     Health Maintenance  There are no preventive care reminders to display for this patient.   Colorectal cancer screening: No longer required.   Mammogram status: Completed 10/2021. Repeat every year  Bone Density status: Completed no further studies per family goals of care. Results reflect: Bone density results: NORMAL. Repeat every 0 years.  Lung Cancer Screening: (Low Dose CT Chest recommended if Age 51-80 years, 30 pack-year currently smoking OR have quit w/in 15years.) does not qualify.   Lung Cancer Screening Referral: No  Additional Screening:  Hepatitis C Screening: does not qualify; Completed   Vision Screening: Recommended annual ophthalmology exams for early detection of glaucoma and other disorders of the eye. Is the patient up to date with their annual eye exam?  Yes  Who is the provider or what is the name of the office in which the patient attends annual eye exams? Dr. [place] If pt is not established with a provider, would they like to be referred to a provider to establish care? No .   Dental Screening: Recommended annual dental exams for proper oral hygiene  Community Resource Referral / Chronic Care Management: CRR required this visit?  No   CCM required this visit?  No      Plan:     I have personally reviewed and noted the following in the patient's chart:   Medical and social history Use of alcohol, tobacco or illicit drugs  Current medications and supplements including opioid prescriptions. Patient is not currently taking opioid prescriptions. Functional ability and status Nutritional status Physical activity Advanced directives List of other physicians Hospitalizations, surgeries, and ER visits in previous 12 months Vitals Screenings to include cognitive, depression, and falls Referrals and appointments  In addition, I have reviewed and discussed with patient certain preventive protocols, quality metrics, and best practice recommendations. A  written personalized care plan for preventive services as well as general preventive health recommendations were provided to patient.     Octavia Heir, NP   08/13/2022   Nurse Notes: Will order yearly mammogram and bone density, advised to schedule together with [facility]

## 2022-08-14 DIAGNOSIS — I69351 Hemiplegia and hemiparesis following cerebral infarction affecting right dominant side: Secondary | ICD-10-CM | POA: Diagnosis not present

## 2022-08-14 DIAGNOSIS — M6389 Disorders of muscle in diseases classified elsewhere, multiple sites: Secondary | ICD-10-CM | POA: Diagnosis not present

## 2022-08-14 DIAGNOSIS — R278 Other lack of coordination: Secondary | ICD-10-CM | POA: Diagnosis not present

## 2022-08-14 DIAGNOSIS — I63312 Cerebral infarction due to thrombosis of left middle cerebral artery: Secondary | ICD-10-CM | POA: Diagnosis not present

## 2022-08-14 DIAGNOSIS — R293 Abnormal posture: Secondary | ICD-10-CM | POA: Diagnosis not present

## 2022-08-14 DIAGNOSIS — I5032 Chronic diastolic (congestive) heart failure: Secondary | ICD-10-CM | POA: Diagnosis not present

## 2022-08-15 DIAGNOSIS — M6389 Disorders of muscle in diseases classified elsewhere, multiple sites: Secondary | ICD-10-CM | POA: Diagnosis not present

## 2022-08-15 DIAGNOSIS — R482 Apraxia: Secondary | ICD-10-CM | POA: Diagnosis not present

## 2022-08-15 DIAGNOSIS — J9611 Chronic respiratory failure with hypoxia: Secondary | ICD-10-CM | POA: Diagnosis not present

## 2022-08-15 DIAGNOSIS — R471 Dysarthria and anarthria: Secondary | ICD-10-CM | POA: Diagnosis not present

## 2022-08-15 DIAGNOSIS — I480 Paroxysmal atrial fibrillation: Secondary | ICD-10-CM | POA: Diagnosis not present

## 2022-08-15 DIAGNOSIS — R278 Other lack of coordination: Secondary | ICD-10-CM | POA: Diagnosis not present

## 2022-08-15 DIAGNOSIS — I63312 Cerebral infarction due to thrombosis of left middle cerebral artery: Secondary | ICD-10-CM | POA: Diagnosis not present

## 2022-08-15 DIAGNOSIS — I69351 Hemiplegia and hemiparesis following cerebral infarction affecting right dominant side: Secondary | ICD-10-CM | POA: Diagnosis not present

## 2022-08-15 DIAGNOSIS — I6602 Occlusion and stenosis of left middle cerebral artery: Secondary | ICD-10-CM | POA: Diagnosis not present

## 2022-08-15 DIAGNOSIS — I5032 Chronic diastolic (congestive) heart failure: Secondary | ICD-10-CM | POA: Diagnosis not present

## 2022-08-16 DIAGNOSIS — I5032 Chronic diastolic (congestive) heart failure: Secondary | ICD-10-CM | POA: Diagnosis not present

## 2022-08-16 DIAGNOSIS — I63312 Cerebral infarction due to thrombosis of left middle cerebral artery: Secondary | ICD-10-CM | POA: Diagnosis not present

## 2022-08-16 DIAGNOSIS — R278 Other lack of coordination: Secondary | ICD-10-CM | POA: Diagnosis not present

## 2022-08-16 DIAGNOSIS — M6389 Disorders of muscle in diseases classified elsewhere, multiple sites: Secondary | ICD-10-CM | POA: Diagnosis not present

## 2022-08-16 DIAGNOSIS — I69351 Hemiplegia and hemiparesis following cerebral infarction affecting right dominant side: Secondary | ICD-10-CM | POA: Diagnosis not present

## 2022-08-19 DIAGNOSIS — I63312 Cerebral infarction due to thrombosis of left middle cerebral artery: Secondary | ICD-10-CM | POA: Diagnosis not present

## 2022-08-19 DIAGNOSIS — R278 Other lack of coordination: Secondary | ICD-10-CM | POA: Diagnosis not present

## 2022-08-19 DIAGNOSIS — M6389 Disorders of muscle in diseases classified elsewhere, multiple sites: Secondary | ICD-10-CM | POA: Diagnosis not present

## 2022-08-19 DIAGNOSIS — I5032 Chronic diastolic (congestive) heart failure: Secondary | ICD-10-CM | POA: Diagnosis not present

## 2022-08-19 DIAGNOSIS — I69351 Hemiplegia and hemiparesis following cerebral infarction affecting right dominant side: Secondary | ICD-10-CM | POA: Diagnosis not present

## 2022-08-20 DIAGNOSIS — R278 Other lack of coordination: Secondary | ICD-10-CM | POA: Diagnosis not present

## 2022-08-20 DIAGNOSIS — I69351 Hemiplegia and hemiparesis following cerebral infarction affecting right dominant side: Secondary | ICD-10-CM | POA: Diagnosis not present

## 2022-08-20 DIAGNOSIS — I5032 Chronic diastolic (congestive) heart failure: Secondary | ICD-10-CM | POA: Diagnosis not present

## 2022-08-20 DIAGNOSIS — M6389 Disorders of muscle in diseases classified elsewhere, multiple sites: Secondary | ICD-10-CM | POA: Diagnosis not present

## 2022-08-20 DIAGNOSIS — I63312 Cerebral infarction due to thrombosis of left middle cerebral artery: Secondary | ICD-10-CM | POA: Diagnosis not present

## 2022-08-21 ENCOUNTER — Ambulatory Visit: Payer: Medicare Other | Admitting: Neurology

## 2022-08-21 VITALS — BP 134/73

## 2022-08-21 DIAGNOSIS — I5032 Chronic diastolic (congestive) heart failure: Secondary | ICD-10-CM | POA: Diagnosis not present

## 2022-08-21 DIAGNOSIS — M6389 Disorders of muscle in diseases classified elsewhere, multiple sites: Secondary | ICD-10-CM | POA: Diagnosis not present

## 2022-08-21 DIAGNOSIS — I63312 Cerebral infarction due to thrombosis of left middle cerebral artery: Secondary | ICD-10-CM | POA: Diagnosis not present

## 2022-08-21 DIAGNOSIS — I69351 Hemiplegia and hemiparesis following cerebral infarction affecting right dominant side: Secondary | ICD-10-CM | POA: Diagnosis not present

## 2022-08-21 DIAGNOSIS — R278 Other lack of coordination: Secondary | ICD-10-CM | POA: Diagnosis not present

## 2022-08-21 DIAGNOSIS — G8111 Spastic hemiplegia affecting right dominant side: Secondary | ICD-10-CM

## 2022-08-21 DIAGNOSIS — G8191 Hemiplegia, unspecified affecting right dominant side: Secondary | ICD-10-CM

## 2022-08-21 MED ORDER — ONABOTULINUMTOXINA 100 UNITS IJ SOLR
200.0000 [IU] | Freq: Once | INTRAMUSCULAR | Status: AC
Start: 2022-08-21 — End: 2022-08-21
  Administered 2022-08-21: 200 [IU] via INTRAMUSCULAR

## 2022-08-21 NOTE — Progress Notes (Signed)
Botox- 100 units x 2 vials Lot: [id number] Expiration: 08/2024 NDC: 4540-9811-91  Bacteriostatic 0.9% Sodium Chloride- 2 mL  Lot: [id number] Expiration: 11/25 NDC: 21308-657-84  Dx: G81.91 B/B Witnessed by Athena Masse RN  Eather Colas, CMA

## 2022-08-21 NOTE — Progress Notes (Signed)
Chief Complaint  Patient presents with   Procedure    Pt ready for botox injection in rm 13 with her husband present       ASSESSMENT AND PLAN  [name] is a 77 y.o. 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  Stroke happened while she was off Eliquis for 1 week for abdominal hematoma, with history of atrial fibrillation  Now with residual aphasia, right hemiparesis,  Electrical stimulation guided Botox a injection for spastic right upper extremity (100 units of Botox a dissolving to 2 cc of normal saline), 1st injection was on May 22 2022  Right pectoralis major 50 units Right latissimus dorsi 25 units Right flexor digitorum profundus 25  units Right pronator teres 25 units Right brachialis 50 units Right palmaris longus 25 units   DIAGNOSTIC DATA (LABS, IMAGING, TESTING) - I reviewed patient records, labs, notes, testing and imaging myself where available.   MEDICAL HISTORY:  Beverly Li is a 77 year old female, accompanied by her husband, seen in request by his primary care at [facility] Dr. Einar Crow for evaluation of botulism toxin injection for spastic right hemiparesis, she is also seen by our clinic for stroke follow-up   I reviewed and summarized the referring note.PMHx. A fib,  Eliquis HTN Peripheral vascular disease. COPD   She suffered stroke on November 13, 2020 presenting with right-sided weakness, right facial droop, left gaze preference,  MRI showed left ACA/MCA stroke, status post IR with TICI, reperfusion of left MCA, in the setting of paroxysmal atrial fibrillation taking off Eliquis for 1 week due to abdominal hematoma  CT angiogram head and neck and four-vessel angiogram postprocedure showed status post revascularization of the occluded left middle cerebral artery M1 segment, and proximal flow arrest achieved TICI 3 revascularization, severe preocclusive stenosis of the proximal right internal carotid artery associated with a  delayed strain signs,  Known history of peripheral vascular disease, CT angiogram November 25, 2020 suspicious for occlusion of distal right femoral-popliteal bypass, low cardiac output, and distal high-grade stenosis/resistance also contributed to the unopacified distal bypass conduit, Absent of right side tibial arterial opacification, moderate to advanced aortic atherosclerosis bilateral iliac arterial disease, with no evidence of high-grade aortoiliac disease.  Echocardiogram in March 2023, ejection fraction 60 to 65%, no regional wall motion abnormality, moderate asymmetric left ventricular hypertrophy of the basal septal segment  She is now at [facility], with right hemiparesis, aphasia, husband reported that she continued to make small progress even now, physical therapy have suggested botulism toxin injection to alleviate her right shoulder and arm weakness   UPDATE May 22 2022: She is accompanied by her husband, receiving first electrical stimulation guided Botox injection for spastic right upper extremity, use of Botox a 100 units x 2, potential side effect explained,  UPDATE Aug 21 2022: She responded well to previous injection, there was no significant side effect noticed, tolerating Botox a 100 units x 2,   PHYSICAL EXAM:   Vitals:   08/21/22 1241  BP: 134/73   Not recorded     There is no height or weight on file to calculate BMI.  PHYSICAL EXAMNIATION:  Gen: NAD, conversant, well nourised, well groomed                     Cardiovascular: Regular rate rhythm, no peripheral edema, warm, nontender. Eyes: Conjunctivae clear without exudates or hemorrhage Neck: Supple, no carotid bruits. Pulmonary: Clear to auscultation bilaterally  NEUROLOGICAL EXAM:  MENTAL STATUS: Speech/cognition: Global aphasia, paraphasic errors, slow in comprehension, word finding difficulties CRANIAL NERVES: CN II: Visual fields are full to confrontation. Pupils are round equal and  briskly reactive to light. CN III, IV, VI: extraocular movement are normal. No ptosis. CN V: Facial sensation is intact to light touch CN VII: Face is symmetric with normal eye closure  CN VIII: Hearing is normal to causal conversation. CN IX, X: Phonation is normal. CN XI: Head turning and shoulder shrug are intact  MOTOR: Right hemiparesis, mild spasticity of right upper extremity, no significant right upper extremity movement, good range of motion of right shoulder, elbow, tendency for right thumb in position,  No antigravity movement of right lower extremity, mild spasticity, mild limited range of motion of right knee,  REFLEXES: Hyperreflexia right upper and lower extremity  SENSORY: Intact to light touch, pinprick and vibratory sensation are intact in fingers and toes.  COORDINATION: There is no trunk or limb dysmetria noted.  GAIT/STANCE: Deferred  REVIEW OF SYSTEMS:  Full 14 system review of systems performed and notable only for as above All other review of systems were negative.   ALLERGIES: Allergies  Allergen Reactions   Amoxicillin Other (See Comments)    UTI Has patient had a PCN reaction causing immediate rash, facial/tongue/throat swelling, SOB or lightheadedness with hypotension: No Has patient had a PCN reaction causing severe rash involving mucus membranes or skin necrosis: No Has patient had a PCN reaction that required hospitalization: No Has patient had a PCN reaction occurring within the last 10 years: Yes--UTI ONLY If all of the above answers are "NO", then may proceed with Cephalosporin use.    Atenolol Cough   Crestor [Rosuvastatin Calcium] Other (See Comments)    Muscle aches   Pravastatin Other (See Comments)    Muscle aches   Rosuvastatin Calcium    Sulfa Antibiotics Nausea Only   Codeine Other (See Comments)    hallucinations    HOME MEDICATIONS: Current Outpatient Medications  Medication Sig Dispense Refill   acetaminophen (TYLENOL)  325 MG tablet Take 650 mg by mouth every 6 (six) hours.     aluminum-magnesium hydroxide-simethicone (MAALOX) 200-200-20 MG/5ML SUSP Take 30 mLs by mouth every 4 (four) hours as needed (heartburn).     amiodarone (PACERONE) 200 MG tablet Take 200 mg by mouth daily. May give crushed     amLODipine (NORVASC) 5 MG tablet Take 5 mg by mouth daily.     apixaban (ELIQUIS) 5 MG TABS tablet Take 5 mg by mouth 2 (two) times daily. May give crushed     bisacodyl (DULCOLAX) 5 MG EC tablet Take 5 mg by mouth 3 (three) times daily as needed for moderate constipation.     botulinum toxin Type A (BOTOX) 100 units SOLR injection Inject 300 units into the muscles every 3 months. 3 each 3   buPROPion (WELLBUTRIN XL) 150 MG 24 hr tablet Take 150 mg by mouth daily.     diclofenac Sodium (VOLTAREN) 1 % GEL Apply one application to right hip and knee four times daily as needed.     Emollient (AQUAPHOR ADV THERAPY HEALING) 41 % OINT Apply 1 Application topically every evening.     Emollient (AQUAPHOR ADV THERAPY HEALING) 41 % OINT Apply 1 Application topically every morning.     ezetimibe (ZETIA) 10 MG tablet Take 10 mg by mouth daily. Oral if crushed     furosemide (LASIX) 20 MG tablet Take 20 mg by mouth daily  as needed. Give Lasix 20mg  for weight gain equal or greater than 3 pounds     hydrALAZINE (APRESOLINE) 10 MG tablet Take 10 mg by mouth 3 (three) times daily as needed (SBP > 165 or DBP > 95).     ipratropium-albuterol (DUONEB) 0.5-2.5 (3) MG/3ML SOLN Inhale 3 mLs into the lungs every 4 (four) hours as needed.     ketoconazole (NIZORAL) 2 % cream Apply 1 application  topically daily as needed for irritation.     nystatin cream (MYCOSTATIN) Apply 1 application topically 2 (two) times daily as needed for dry skin (apply to perirectal rash).     ondansetron (ZOFRAN) 4 MG tablet Take 4 mg by mouth every 6 (six) hours as needed for nausea or vomiting.     OXYGEN Inhale 3 L into the lungs as directed. to maintain sats  > or equal to 90%. May titrate Three Times A Day; 07:00 AM - 03:00 PM, 03:00 PM - 11:00 PM, 11:00 PM - 7:00 am     pantoprazole (PROTONIX) 40 MG tablet Take 40 mg by mouth 2 (two) times daily.     Polyethyl Glycol-Propyl Glycol (SYSTANE) 0.4-0.3 % SOLN Place 2 drops into both eyes 4 (four) times daily as needed (irritation).     polyethylene glycol powder (GLYCOLAX/MIRALAX) 17 GM/SCOOP powder Take 17 g by mouth daily.     promethazine (PHENERGAN) 12.5 MG suppository Place 12.5 mg rectally every 6 (six) hours as needed for nausea or vomiting.     Tiotropium Bromide-Olodaterol (STIOLTO RESPIMAT) 2.5-2.5 MCG/ACT AERS Inhale 2 puffs into the lungs daily.     zinc oxide 20 % ointment Apply 1 Application topically as needed for irritation.     Current Facility-Administered Medications  Medication Dose Route Frequency Provider Last Rate Last Admin   botulinum toxin Type A (BOTOX) injection 100 Units  100 Units Intramuscular Once Levert Feinstein, MD        PAST MEDICAL HISTORY: Past Medical History:  Diagnosis Date   Anxiety    Arthritis    "some in my lower back; probably elbows, knees" (11/18/2017)   Atrial fibrillation (HCC)    Bell's palsy    when pt. was 77 yrs old, when under stress the left side of face will droop.   Complication of anesthesia    "vascular OR 2016; BP bottomed out; couldn't get it regulated; ended up in ICU for DAYS" (11/18/2017)   GERD (gastroesophageal reflux disease)    History of kidney stones    Hypertension    Hypertrophic cardiomyopathy (HCC)    severe LV basilar hypertrophy witn no evidence of significant outflow tract obstruction, EF 65-70%, mild LAE, mild TR, grade 1a diastolic dysfunction 05/15/10 (Dr. Donato Schultz) (Atrial Septal Hypertrophy pattern)-- Intra-op TEE with dsignificant outflow tract obstruction - AI, MR & TR   Insomnia    Mild aortic sclerosis    Osteopenia    Peripheral vascular disease (HCC)    Syncope    , Vagal    PAST SURGICAL HISTORY: Past  Surgical History:  Procedure Laterality Date   AUGMENTATION MAMMAPLASTY Bilateral    BACK SURGERY     CARDIAC CATHETERIZATION N/A 05/07/2016   Procedure: Left Heart Cath and Coronary Angiography;  Surgeon: Kathleene Hazel, MD;  Location: [facility] INVASIVE CV LAB;  Service: Cardiovascular;  Laterality: N/A;   CARDIOVERSION N/A 09/24/2017   Procedure: CARDIOVERSION;  Surgeon: Laurey Morale, MD;  Location: [facility] ENDOSCOPY;  Service: Cardiovascular;  Laterality: N/A;   DILATION AND CURETTAGE  OF UTERUS     ENDARTERECTOMY FEMORAL Right 03/02/2015   Procedure: ENDARTERECTOMY RIGHT FEMORAL;  Surgeon: Chuck Hint, MD;  Location: [facility] OR;  Service: Vascular;  Laterality: Right;   ESOPHAGOGASTRODUODENOSCOPY (EGD) WITH PROPOFOL N/A 12/01/2020   Procedure: ESOPHAGOGASTRODUODENOSCOPY (EGD) WITH PROPOFOL;  Surgeon: Diamantina Monks, MD;  Location: [facility] ENDOSCOPY;  Service: General;  Laterality: N/A;   FACIAL COSMETIC SURGERY Left 2002   "related to Bell's Palsy @ age 7; left eye/side of face droopy; tried to make area symmetrical"   FEMORAL-POPLITEAL BYPASS GRAFT Right 03/02/2015   Procedure: BYPASS GRAFT FEMORAL-BELOW KNEE POPLITEAL ARTERY;  Surgeon: Chuck Hint, MD;  Location: [facility] OR;  Service: Vascular;  Laterality: Right;   INGUINAL HERNIA REPAIR Bilateral 2002   IR ANGIO INTRA EXTRACRAN SEL COM CAROTID INNOMINATE UNI L MOD SED  11/16/2020   IR CT HEAD LTD  11/13/2020   IR PERCUTANEOUS ART THROMBECTOMY/INFUSION INTRACRANIAL INC DIAG ANGIO  11/13/2020   OVARIAN CYST REMOVAL Left    PEG PLACEMENT N/A 12/01/2020   Procedure: PERCUTANEOUS ENDOSCOPIC GASTROSTOMY (PEG) PLACEMENT;  Surgeon: Diamantina Monks, MD;  Location: [facility] ENDOSCOPY;  Service: General;  Laterality: N/A;   PERIPHERAL VASCULAR CATHETERIZATION N/A 01/16/2015   Procedure: Abdominal Aortogram;  Surgeon: Chuck Hint, MD;  Location: [facility] INVASIVE CV LAB;  Service: Cardiovascular;  Laterality: N/A;   POSTERIOR LUMBAR FUSION  2015    "have plates and screws in there"   RADIOLOGY WITH ANESTHESIA N/A 11/13/2020   Procedure: IR WITH ANESTHESIA;  Surgeon: Radiologist, Medication, MD;  Location: [facility] OR;  Service: Radiology;  Laterality: N/A;   TONSILLECTOMY      FAMILY HISTORY: Family History  Problem Relation Age of Onset   Liver cancer Mother    Cancer Mother        Liver   Hypertension Mother    Lung cancer Father    Cancer Father        Lung   Breast cancer Sister    Cancer Sister        Breast    SOCIAL HISTORY: Social History   Socioeconomic History   Marital status: Married    Spouse name: Optometrist   Number of children: Not on file   Years of education: Not on file   Highest education level: Not on file  Occupational History   Not on file  Tobacco Use   Smoking status: Former    Packs/day: 1.00    Years: 50.00    Additional pack years: 0.00    Total pack years: 50.00    Types: Cigarettes    Quit date: 12/15/2014    Years since quitting: 7.6   Smokeless tobacco: Never  Vaping Use   Vaping Use: Never used  Substance and Sexual Activity   Alcohol use: Yes    Comment: 11/18/2017 "might have a couple glasses of wine/month; if that"   Drug use: Not on file   Sexual activity: Not Currently  Other Topics Concern   Not on file  Social History Narrative   11/21/21 living at [facility]   Social Determinants of Health   Financial Resource Strain: Low Risk  (08/13/2022)   Overall Financial Resource Strain (CARDIA)    Difficulty of Paying Living Expenses: Not hard at all  Food Insecurity: No Food Insecurity (08/13/2022)   Hunger Vital Sign    Worried About Running Out of Food in the Last Year: Never true    Ran Out of Food in the Last Year: Never  true  Transportation Needs: No Transportation Needs (08/13/2022)   PRAPARE - Administrator, Civil Service (Medical): No    Lack of Transportation (Non-Medical): No  Physical Activity: Insufficiently Active (08/13/2022)   Exercise Vital Sign    Days  of Exercise per Week: 3 days    Minutes of Exercise per Session: 30 min  Stress: No Stress Concern Present (08/13/2022)   [employer] of Occupational Health - Occupational Stress Questionnaire    Feeling of Stress : Only a little  Social Connections: Moderately Isolated (08/13/2022)   Social Connection and Isolation Panel [NHANES]    Frequency of Communication with Friends and Family: Patient unable to answer    Frequency of Social Gatherings with Friends and Family: More than three times a week    Attends Religious Services: Never    Database administrator or Organizations: No    Attends Banker Meetings: Never    Marital Status: Married  Catering manager Violence: Not At Risk (08/13/2022)   Humiliation, Afraid, Rape, and Kick questionnaire    Fear of Current or Ex-Partner: No    Emotionally Abused: No    Physically Abused: No    Sexually Abused: No      Levert Feinstein, M.D. Ph.D.  [facility] [address], [address] [place], [place] [address] Ph: [phone] Fax: [phone]  CC:  [name], MD [address] [place],  [place] [address]  [name], MD

## 2022-08-22 ENCOUNTER — Telehealth: Payer: Self-pay | Admitting: Neurology

## 2022-08-22 DIAGNOSIS — I6602 Occlusion and stenosis of left middle cerebral artery: Secondary | ICD-10-CM | POA: Diagnosis not present

## 2022-08-22 DIAGNOSIS — R482 Apraxia: Secondary | ICD-10-CM | POA: Diagnosis not present

## 2022-08-22 DIAGNOSIS — I69351 Hemiplegia and hemiparesis following cerebral infarction affecting right dominant side: Secondary | ICD-10-CM | POA: Diagnosis not present

## 2022-08-22 DIAGNOSIS — J9611 Chronic respiratory failure with hypoxia: Secondary | ICD-10-CM | POA: Diagnosis not present

## 2022-08-22 DIAGNOSIS — I480 Paroxysmal atrial fibrillation: Secondary | ICD-10-CM | POA: Diagnosis not present

## 2022-08-22 DIAGNOSIS — M6389 Disorders of muscle in diseases classified elsewhere, multiple sites: Secondary | ICD-10-CM | POA: Diagnosis not present

## 2022-08-22 DIAGNOSIS — R278 Other lack of coordination: Secondary | ICD-10-CM | POA: Diagnosis not present

## 2022-08-22 DIAGNOSIS — I63312 Cerebral infarction due to thrombosis of left middle cerebral artery: Secondary | ICD-10-CM | POA: Diagnosis not present

## 2022-08-22 DIAGNOSIS — R471 Dysarthria and anarthria: Secondary | ICD-10-CM | POA: Diagnosis not present

## 2022-08-22 DIAGNOSIS — I5032 Chronic diastolic (congestive) heart failure: Secondary | ICD-10-CM | POA: Diagnosis not present

## 2022-08-22 NOTE — Telephone Encounter (Signed)
Return call to [facility] RN at [facility], informed that no orders were placed at end of visit yesterday. Misty appreciative of call.

## 2022-08-22 NOTE — Telephone Encounter (Signed)
Misty, RN @ [facility]  is asking if there are any orders they need to be made aware of.  She is aware of the Botox appointment

## 2022-08-26 DIAGNOSIS — I69351 Hemiplegia and hemiparesis following cerebral infarction affecting right dominant side: Secondary | ICD-10-CM | POA: Diagnosis not present

## 2022-08-26 DIAGNOSIS — R278 Other lack of coordination: Secondary | ICD-10-CM | POA: Diagnosis not present

## 2022-08-26 DIAGNOSIS — I63312 Cerebral infarction due to thrombosis of left middle cerebral artery: Secondary | ICD-10-CM | POA: Diagnosis not present

## 2022-08-26 DIAGNOSIS — M6389 Disorders of muscle in diseases classified elsewhere, multiple sites: Secondary | ICD-10-CM | POA: Diagnosis not present

## 2022-08-26 DIAGNOSIS — I5032 Chronic diastolic (congestive) heart failure: Secondary | ICD-10-CM | POA: Diagnosis not present

## 2022-08-27 DIAGNOSIS — I63312 Cerebral infarction due to thrombosis of left middle cerebral artery: Secondary | ICD-10-CM | POA: Diagnosis not present

## 2022-08-27 DIAGNOSIS — I480 Paroxysmal atrial fibrillation: Secondary | ICD-10-CM | POA: Diagnosis not present

## 2022-08-27 DIAGNOSIS — M6389 Disorders of muscle in diseases classified elsewhere, multiple sites: Secondary | ICD-10-CM | POA: Diagnosis not present

## 2022-08-27 DIAGNOSIS — R482 Apraxia: Secondary | ICD-10-CM | POA: Diagnosis not present

## 2022-08-27 DIAGNOSIS — I69351 Hemiplegia and hemiparesis following cerebral infarction affecting right dominant side: Secondary | ICD-10-CM | POA: Diagnosis not present

## 2022-08-27 DIAGNOSIS — I6602 Occlusion and stenosis of left middle cerebral artery: Secondary | ICD-10-CM | POA: Diagnosis not present

## 2022-08-27 DIAGNOSIS — R278 Other lack of coordination: Secondary | ICD-10-CM | POA: Diagnosis not present

## 2022-08-27 DIAGNOSIS — I5032 Chronic diastolic (congestive) heart failure: Secondary | ICD-10-CM | POA: Diagnosis not present

## 2022-08-27 DIAGNOSIS — R471 Dysarthria and anarthria: Secondary | ICD-10-CM | POA: Diagnosis not present

## 2022-08-29 DIAGNOSIS — M6389 Disorders of muscle in diseases classified elsewhere, multiple sites: Secondary | ICD-10-CM | POA: Diagnosis not present

## 2022-08-29 DIAGNOSIS — J9611 Chronic respiratory failure with hypoxia: Secondary | ICD-10-CM | POA: Diagnosis not present

## 2022-08-29 DIAGNOSIS — I5032 Chronic diastolic (congestive) heart failure: Secondary | ICD-10-CM | POA: Diagnosis not present

## 2022-08-29 DIAGNOSIS — I480 Paroxysmal atrial fibrillation: Secondary | ICD-10-CM | POA: Diagnosis not present

## 2022-08-29 DIAGNOSIS — R278 Other lack of coordination: Secondary | ICD-10-CM | POA: Diagnosis not present

## 2022-08-29 DIAGNOSIS — I63312 Cerebral infarction due to thrombosis of left middle cerebral artery: Secondary | ICD-10-CM | POA: Diagnosis not present

## 2022-08-29 DIAGNOSIS — I6602 Occlusion and stenosis of left middle cerebral artery: Secondary | ICD-10-CM | POA: Diagnosis not present

## 2022-08-29 DIAGNOSIS — I69351 Hemiplegia and hemiparesis following cerebral infarction affecting right dominant side: Secondary | ICD-10-CM | POA: Diagnosis not present

## 2022-08-29 DIAGNOSIS — R482 Apraxia: Secondary | ICD-10-CM | POA: Diagnosis not present

## 2022-08-29 DIAGNOSIS — R471 Dysarthria and anarthria: Secondary | ICD-10-CM | POA: Diagnosis not present

## 2022-08-30 DIAGNOSIS — M6389 Disorders of muscle in diseases classified elsewhere, multiple sites: Secondary | ICD-10-CM | POA: Diagnosis not present

## 2022-08-30 DIAGNOSIS — R278 Other lack of coordination: Secondary | ICD-10-CM | POA: Diagnosis not present

## 2022-08-30 DIAGNOSIS — I63312 Cerebral infarction due to thrombosis of left middle cerebral artery: Secondary | ICD-10-CM | POA: Diagnosis not present

## 2022-08-30 DIAGNOSIS — I5032 Chronic diastolic (congestive) heart failure: Secondary | ICD-10-CM | POA: Diagnosis not present

## 2022-08-30 DIAGNOSIS — I69351 Hemiplegia and hemiparesis following cerebral infarction affecting right dominant side: Secondary | ICD-10-CM | POA: Diagnosis not present

## 2022-09-02 DIAGNOSIS — R278 Other lack of coordination: Secondary | ICD-10-CM | POA: Diagnosis not present

## 2022-09-02 DIAGNOSIS — I63312 Cerebral infarction due to thrombosis of left middle cerebral artery: Secondary | ICD-10-CM | POA: Diagnosis not present

## 2022-09-02 DIAGNOSIS — M6389 Disorders of muscle in diseases classified elsewhere, multiple sites: Secondary | ICD-10-CM | POA: Diagnosis not present

## 2022-09-02 DIAGNOSIS — I69351 Hemiplegia and hemiparesis following cerebral infarction affecting right dominant side: Secondary | ICD-10-CM | POA: Diagnosis not present

## 2022-09-02 DIAGNOSIS — I5032 Chronic diastolic (congestive) heart failure: Secondary | ICD-10-CM | POA: Diagnosis not present

## 2022-09-03 ENCOUNTER — Encounter: Payer: Self-pay | Admitting: Orthopedic Surgery

## 2022-09-03 DIAGNOSIS — I5032 Chronic diastolic (congestive) heart failure: Secondary | ICD-10-CM | POA: Diagnosis not present

## 2022-09-03 DIAGNOSIS — I6602 Occlusion and stenosis of left middle cerebral artery: Secondary | ICD-10-CM | POA: Diagnosis not present

## 2022-09-03 DIAGNOSIS — R471 Dysarthria and anarthria: Secondary | ICD-10-CM | POA: Diagnosis not present

## 2022-09-03 DIAGNOSIS — R278 Other lack of coordination: Secondary | ICD-10-CM | POA: Diagnosis not present

## 2022-09-03 DIAGNOSIS — M6389 Disorders of muscle in diseases classified elsewhere, multiple sites: Secondary | ICD-10-CM | POA: Diagnosis not present

## 2022-09-03 DIAGNOSIS — I63312 Cerebral infarction due to thrombosis of left middle cerebral artery: Secondary | ICD-10-CM | POA: Diagnosis not present

## 2022-09-03 DIAGNOSIS — I69351 Hemiplegia and hemiparesis following cerebral infarction affecting right dominant side: Secondary | ICD-10-CM | POA: Diagnosis not present

## 2022-09-03 DIAGNOSIS — R482 Apraxia: Secondary | ICD-10-CM | POA: Diagnosis not present

## 2022-09-03 DIAGNOSIS — I480 Paroxysmal atrial fibrillation: Secondary | ICD-10-CM | POA: Diagnosis not present

## 2022-09-03 NOTE — Progress Notes (Signed)
[facility] Room Number: 116-A Place of Service:  SNF [phone]) Provider:  Hazle Nordmann, NP  PCP: [name], MD  Patient Care Team: [name], MD as PCP - General (Internal Medicine) [name], MD as PCP - Cardiology (Cardiology) Duke Salvia, MD as PCP - Electrophysiology (Cardiology) Shirlean Kelly, MD as Consulting Physician (Neurosurgery)  Extended Emergency Contact Information Primary Emergency Contact: Li,Beverly G Address: [address]          [place] [phone] Li Beverly of [place] Home Phone: [phone] Mobile Phone: [phone] Relation: Spouse Secondary Emergency Contact: Li,Beverly Address: [address]          [place], [place] [address] Li Beverly of [place] Mobile Phone: [phone] Relation: Son  Code Status:  FULL CODE  Goals of care: Advanced Directive information    09/03/2022   11:05 AM  Advanced Directives  Does Patient Have a Medical Advance Directive? Yes  Type of Advance Directive Living will  Does patient want to make changes to medical advance directive? No - Patient declined     Chief Complaint  Patient presents with   Medical Management of Chronic Issues    Routine Visit.     HPI:  Pt is a 77 y.o. female seen today for medical management of chronic diseases.     Past Medical History:  Diagnosis Date   Anxiety    Arthritis    "some in my lower back; probably elbows, knees" (11/18/2017)   Atrial fibrillation (HCC)    Bell's palsy    when pt. was 77 yrs old, when under stress the left side of face will droop.   Complication of anesthesia    "vascular OR 2016; BP bottomed out; couldn't get it regulated; ended up in ICU for DAYS" (11/18/2017)   GERD (gastroesophageal reflux disease)    History of kidney stones    Hypertension    Hypertrophic cardiomyopathy (HCC)    severe LV basilar hypertrophy witn no evidence of significant outflow tract  obstruction, EF 65-70%, mild LAE, mild TR, grade 1a diastolic dysfunction 05/15/10 (Dr. Donato Schultz) (Atrial Septal Hypertrophy pattern)-- Intra-op TEE with dsignificant outflow tract obstruction - AI, MR & TR   Insomnia    Mild aortic sclerosis    Osteopenia    Peripheral vascular disease (HCC)    Syncope    , Vagal   Past Surgical History:  Procedure Laterality Date   AUGMENTATION MAMMAPLASTY Bilateral    BACK SURGERY     CARDIAC CATHETERIZATION N/A 05/07/2016   Procedure: Left Heart Cath and Coronary Angiography;  Surgeon: Kathleene Hazel, MD;  Location: [facility] INVASIVE CV LAB;  Service: Cardiovascular;  Laterality: N/A;   CARDIOVERSION N/A 09/24/2017   Procedure: CARDIOVERSION;  Surgeon: Laurey Morale, MD;  Location: [facility] ENDOSCOPY;  Service: Cardiovascular;  Laterality: N/A;   DILATION AND CURETTAGE OF UTERUS     ENDARTERECTOMY FEMORAL Right 03/02/2015   Procedure: ENDARTERECTOMY RIGHT FEMORAL;  Surgeon: Chuck Hint, MD;  Location: [facility] OR;  Service: Vascular;  Laterality: Right;   ESOPHAGOGASTRODUODENOSCOPY (EGD) WITH PROPOFOL N/A 12/01/2020   Procedure: ESOPHAGOGASTRODUODENOSCOPY (EGD) WITH PROPOFOL;  Surgeon: Diamantina Monks, MD;  Location: [facility] ENDOSCOPY;  Service: General;  Laterality: N/A;   FACIAL COSMETIC SURGERY Left 2002   "related to Bell's Palsy @ age 23; left eye/side of face droopy; tried to make area symmetrical"   FEMORAL-POPLITEAL BYPASS GRAFT Right 03/02/2015   Procedure: BYPASS GRAFT FEMORAL-BELOW  KNEE POPLITEAL ARTERY;  Surgeon: Chuck Hint, MD;  Location: [facility] OR;  Service: Vascular;  Laterality: Right;   INGUINAL HERNIA REPAIR Bilateral 2002   IR ANGIO INTRA EXTRACRAN SEL COM CAROTID INNOMINATE UNI L MOD SED  11/16/2020   IR CT HEAD LTD  11/13/2020   IR PERCUTANEOUS ART THROMBECTOMY/INFUSION INTRACRANIAL INC DIAG ANGIO  11/13/2020   OVARIAN CYST REMOVAL Left    PEG PLACEMENT N/A 12/01/2020   Procedure: PERCUTANEOUS ENDOSCOPIC GASTROSTOMY (PEG)  PLACEMENT;  Surgeon: Diamantina Monks, MD;  Location: [facility] ENDOSCOPY;  Service: General;  Laterality: N/A;   PERIPHERAL VASCULAR CATHETERIZATION N/A 01/16/2015   Procedure: Abdominal Aortogram;  Surgeon: Chuck Hint, MD;  Location: [facility] INVASIVE CV LAB;  Service: Cardiovascular;  Laterality: N/A;   POSTERIOR LUMBAR FUSION  2015   "have plates and screws in there"   RADIOLOGY WITH ANESTHESIA N/A 11/13/2020   Procedure: IR WITH ANESTHESIA;  Surgeon: Radiologist, Medication, MD;  Location: [facility] OR;  Service: Radiology;  Laterality: N/A;   TONSILLECTOMY      Allergies  Allergen Reactions   Amoxicillin Other (See Comments)    UTI Has patient had a PCN reaction causing immediate rash, facial/tongue/throat swelling, SOB or lightheadedness with hypotension: No Has patient had a PCN reaction causing severe rash involving mucus membranes or skin necrosis: No Has patient had a PCN reaction that required hospitalization: No Has patient had a PCN reaction occurring within the last 10 years: Yes--UTI ONLY If all of the above answers are "NO", then may proceed with Cephalosporin use.    Atenolol Cough   Crestor [Rosuvastatin Calcium] Other (See Comments)    Muscle aches   Pravastatin Other (See Comments)    Muscle aches   Rosuvastatin Calcium    Sulfa Antibiotics Nausea Only   Codeine Other (See Comments)    hallucinations    Allergies as of 09/03/2022       Reactions   Amoxicillin Other (See Comments)   UTI Has patient had a PCN reaction causing immediate rash, facial/tongue/throat swelling, SOB or lightheadedness with hypotension: No Has patient had a PCN reaction causing severe rash involving mucus membranes or skin necrosis: No Has patient had a PCN reaction that required hospitalization: No Has patient had a PCN reaction occurring within the last 10 years: Yes--UTI ONLY If all of the above answers are "NO", then may proceed with Cephalosporin use.   Atenolol Cough   Crestor  [rosuvastatin Calcium] Other (See Comments)   Muscle aches   Pravastatin Other (See Comments)   Muscle aches   Rosuvastatin Calcium    Sulfa Antibiotics Nausea Only   Codeine Other (See Comments)   hallucinations        Medication List        Accurate as of Sep 03, 2022 11:05 AM. If you have any questions, ask your nurse or doctor.          STOP taking these medications    Botox 100 units Solr injection Generic drug: botulinum toxin Type A Stopped by: Octavia Heir, NP       TAKE these medications    acetaminophen 325 MG tablet Commonly known as: TYLENOL Take 650 mg by mouth every 6 (six) hours.   aluminum-magnesium hydroxide-simethicone 200-200-20 MG/5ML Susp Commonly known as: MAALOX Take 30 mLs by mouth every 4 (four) hours as needed (heartburn).   aluminum-magnesium hydroxide-simethicone 200-200-20 MG/5ML Susp Commonly known as: MAALOX Take 30 mLs by mouth every 4 (four) hours as needed.  amiodarone 200 MG tablet Commonly known as: PACERONE Take 200 mg by mouth daily. May give crushed   amLODipine 5 MG tablet Commonly known as: NORVASC Take 5 mg by mouth daily.   apixaban 5 MG Tabs tablet Commonly known as: ELIQUIS Take 5 mg by mouth 2 (two) times daily. May give crushed   bisacodyl 5 MG EC tablet Commonly known as: DULCOLAX Take 5 mg by mouth 3 (three) times daily as needed for moderate constipation.   buPROPion 150 MG 24 hr tablet Commonly known as: WELLBUTRIN XL Take 150 mg by mouth daily.   CeraVe Crea Apply 1 Application topically as needed. What changed: Another medication with the same name was removed. Continue taking this medication, and follow the directions you see here. Changed by: Octavia Heir, NP   ezetimibe 10 MG tablet Commonly known as: ZETIA Take 10 mg by mouth daily. Oral if crushed   furosemide 20 MG tablet Commonly known as: LASIX Take 20 mg by mouth daily as needed. Give Lasix 20mg  for weight gain equal or greater  than 3 pounds   hydrALAZINE 10 MG tablet Commonly known as: APRESOLINE Take 10 mg by mouth 3 (three) times daily as needed (SBP > 165 or DBP > 95).   ipratropium-albuterol 0.5-2.5 (3) MG/3ML Soln Commonly known as: DUONEB Inhale 3 mLs into the lungs every 4 (four) hours as needed.   ketoconazole 2 % cream Commonly known as: NIZORAL Apply 1 application  topically daily as needed for irritation.   nystatin cream Commonly known as: MYCOSTATIN Apply 1 application topically 2 (two) times daily as needed for dry skin (apply to perirectal rash).   ondansetron 4 MG tablet Commonly known as: ZOFRAN Take 4 mg by mouth every 6 (six) hours as needed for nausea or vomiting.   OXYGEN Inhale 3 L into the lungs as directed. to maintain sats > or equal to 90%. May titrate Three Times A Day; 07:00 AM - 03:00 PM, 03:00 PM - 11:00 PM, 11:00 PM - 7:00 am   pantoprazole 40 MG tablet Commonly known as: PROTONIX Take 40 mg by mouth 2 (two) times daily.   polyethylene glycol powder 17 GM/SCOOP powder Commonly known as: GLYCOLAX/MIRALAX Take 17 g by mouth daily.   promethazine 12.5 MG suppository Commonly known as: PHENERGAN Place 12.5 mg rectally every 6 (six) hours as needed for nausea or vomiting.   Stiolto Respimat 2.5-2.5 MCG/ACT Aers Generic drug: Tiotropium Bromide-Olodaterol Inhale 2 puffs into the lungs daily.   Systane 0.4-0.3 % Soln Generic drug: Polyethyl Glycol-Propyl Glycol Place 2 drops into both eyes 4 (four) times daily as needed (irritation).   Voltaren 1 % Gel Generic drug: diclofenac Sodium Apply one application to right hip and knee four times daily as needed.   zinc oxide 20 % ointment Apply 1 Application topically as needed for irritation.        Review of Systems  Immunization History  Administered Date(s) Administered   Fluad Quad(high Dose 65+) 02/05/2022   Influenza Split 01/16/2009, 04/30/2010, 01/26/2016, 02/07/2018, 02/01/2020   Influenza, High Dose  Seasonal PF 02/07/2018, 01/25/2019, 02/28/2021, 02/05/2022   Influenza,inj,Quad PF,6+ Mos 03/06/2012, 01/26/2016, 01/10/2017   Influenza,inj,quad, With Preservative 02/14/2015   Moderna Covid-19 Vaccine Bivalent Booster 23yrs & up 02/28/2021   PFIZER(Purple Top)SARS-COV-2 Vaccination 05/06/2019, 05/27/2019, 01/27/2020, 09/18/2020   Pneumococcal Conjugate-13 03/19/2017   Pneumococcal Polysaccharide-23 02/14/2015   Respiratory Syncytial Virus Vaccine,Recomb Aduvanted(Arexvy) 02/15/2022   Td 02/25/2005   Tdap 05/19/2013   Zoster, Live 07/28/2006  Pertinent  Health Maintenance Due  Topic Date Due   INFLUENZA VACCINE  11/14/2022   DEXA SCAN  Discontinued      07/08/2021    9:00 AM 07/08/2021    9:30 PM 07/09/2021    8:00 AM 05/07/2022   11:59 AM 08/13/2022   11:02 AM  Fall Risk  Falls in the past year?    0 1  Was there an injury with Fall?    0 0  Fall Risk Category Calculator    0 1  (RETIRED) Patient Fall Risk Level High fall risk High fall risk Moderate fall risk    Patient at Risk for Falls Due to    No Fall Risks History of fall(s);Impaired balance/gait;Impaired mobility  Patient at Risk for Falls Due to - Comments     right sided hemiparesis  Fall risk Follow up    Falls evaluation completed Falls evaluation completed;Education provided;Falls prevention discussed   Functional Status Survey:    Vitals:   09/03/22 1058  BP: 138/72  Pulse: (!) 43  Resp: 16  Temp: (!) 97.2 F (36.2 C)  SpO2: 90%  Weight: 97 lb 9.6 oz (44.3 kg)  Height: 5\' 3"  (1.6 m)   Body mass index is 17.29 kg/m. Physical Exam  Labs reviewed: Recent Labs    09/18/21 0000 12/03/21 0000 03/18/22 0500  NA 135* 139 140  K 5.1 4.4 4.3  CL 101 104 106  CO2 27* 27* 26*  BUN 32* 28* 31*  CREATININE 0.9 0.9 0.9  CALCIUM 8.8 9.0 8.9   Recent Labs    12/03/21 0000 12/10/21 0000 03/18/22 0500  AST 38* 32 33  ALT 48* 38* 39*  ALKPHOS 122 122 89  ALBUMIN 3.4* 3.5 3.4*   Recent Labs     03/18/22 0500  WBC 5.9  HGB 13.3  HCT 41  PLT 224   Lab Results  Component Value Date   TSH 0.61 08/08/2022   Lab Results  Component Value Date   HGBA1C 6.0 (H) 11/13/2020   Lab Results  Component Value Date   CHOL 182 11/22/2021   HDL 80 (A) 11/22/2021   LDLCALC 89 11/22/2021   TRIG 64 11/22/2021   CHOLHDL 3.0 11/13/2020    Significant Diagnostic Results in last 30 days:  No results found.  Assessment/Plan There are no diagnoses linked to this encounter.   Family/ staff Communication:   Labs/tests ordered:

## 2022-09-03 NOTE — Progress Notes (Signed)
This encounter was created in error - please disregard.

## 2022-09-04 ENCOUNTER — Non-Acute Institutional Stay (SKILLED_NURSING_FACILITY): Payer: Medicare Other | Admitting: Orthopedic Surgery

## 2022-09-04 ENCOUNTER — Encounter: Payer: Self-pay | Admitting: Orthopedic Surgery

## 2022-09-04 DIAGNOSIS — F331 Major depressive disorder, recurrent, moderate: Secondary | ICD-10-CM

## 2022-09-04 DIAGNOSIS — M6389 Disorders of muscle in diseases classified elsewhere, multiple sites: Secondary | ICD-10-CM | POA: Diagnosis not present

## 2022-09-04 DIAGNOSIS — L2389 Allergic contact dermatitis due to other agents: Secondary | ICD-10-CM

## 2022-09-04 DIAGNOSIS — I69351 Hemiplegia and hemiparesis following cerebral infarction affecting right dominant side: Secondary | ICD-10-CM | POA: Diagnosis not present

## 2022-09-04 DIAGNOSIS — J9611 Chronic respiratory failure with hypoxia: Secondary | ICD-10-CM

## 2022-09-04 DIAGNOSIS — I5032 Chronic diastolic (congestive) heart failure: Secondary | ICD-10-CM

## 2022-09-04 DIAGNOSIS — J439 Emphysema, unspecified: Secondary | ICD-10-CM | POA: Diagnosis not present

## 2022-09-04 DIAGNOSIS — E782 Mixed hyperlipidemia: Secondary | ICD-10-CM | POA: Diagnosis not present

## 2022-09-04 DIAGNOSIS — R2681 Unsteadiness on feet: Secondary | ICD-10-CM

## 2022-09-04 DIAGNOSIS — G8191 Hemiplegia, unspecified affecting right dominant side: Secondary | ICD-10-CM | POA: Diagnosis not present

## 2022-09-04 DIAGNOSIS — F5101 Primary insomnia: Secondary | ICD-10-CM

## 2022-09-04 DIAGNOSIS — I480 Paroxysmal atrial fibrillation: Secondary | ICD-10-CM | POA: Diagnosis not present

## 2022-09-04 DIAGNOSIS — R001 Bradycardia, unspecified: Secondary | ICD-10-CM | POA: Diagnosis not present

## 2022-09-04 DIAGNOSIS — I2510 Atherosclerotic heart disease of native coronary artery without angina pectoris: Secondary | ICD-10-CM

## 2022-09-04 DIAGNOSIS — I10 Essential (primary) hypertension: Secondary | ICD-10-CM

## 2022-09-04 DIAGNOSIS — R278 Other lack of coordination: Secondary | ICD-10-CM | POA: Diagnosis not present

## 2022-09-04 DIAGNOSIS — I63312 Cerebral infarction due to thrombosis of left middle cerebral artery: Secondary | ICD-10-CM | POA: Diagnosis not present

## 2022-09-04 DIAGNOSIS — K219 Gastro-esophageal reflux disease without esophagitis: Secondary | ICD-10-CM

## 2022-09-04 NOTE — Progress Notes (Signed)
[facility] Room Number: 116-A Place of Service:  SNF [phone]) Provider:  Hazle Nordmann, NP  PCP: [name], MD  Patient Care Team: [name], MD as PCP - General (Internal Medicine) [name], MD as PCP - Cardiology (Cardiology) Duke Salvia, MD as PCP - Electrophysiology (Cardiology) Shirlean Kelly, MD as Consulting Physician (Neurosurgery)  Extended Emergency Contact Information Primary Emergency Contact: Fluegel,[name] Address: [address]          [place] [phone] [name] Home Phone: [phone] Mobile Phone: [phone] Relation: Spouse Secondary Emergency Contact: Testa,[name] Address: [address]          [place], [place] [address] [name] Mobile Phone: [phone] Relation: Son  Code Status:  FULL CODE  Goals of care: Advanced Directive information    09/04/2022    2:05 PM  Advanced Directives  Does Patient Have a Medical Advance Directive? Yes  Type of Advance Directive Living will  Does patient want to make changes to medical advance directive? No - Patient declined     Chief Complaint  Patient presents with   Medical Management of Chronic Issues    Routine Visit.     HPI:  Pt is a 77 y.o. female seen today for medical management of chronic diseases.    She currently resides on the skilled nursing unit at [facility]. 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, lives in skilled nursing, dependent with ADLs, Botox injections 02/07 Unstable gait- working with OT on P[facility] HTN- BUN/creat 31/0.9 (12/04), see trends below, remains on amlodipine HLD- total 182, LDL 89 (11/2021), statin intolerance, remains on Zetia PAF- TSH 0.61 08/08/2022, HR controlled with amiodarone, on Eliquis for clot prevention CHF- 2D echo LVEF 60-65% 06/2021, remains on furosemide prn CAD- remains on Zetia and Eliquis Depression- no mood changes, remains on Wellbutrin GERD- hgb 13.3 (12/04), remains on Protonix Insomnia- remains on Ambien prn   Recent blood pressures:  05/16- 138/72  05/14- 132/81  05/07- 148/78  Recent weights:  05/07- 97.6 lbs  03/07- 97.6 lbs  02/08- 101 lbs   Past Surgical History:  Procedure Laterality Date   AUGMENTATION MAMMAPLASTY Bilateral    BACK SURGERY     CARDIAC CATHETERIZATION N/A 05/07/2016   Procedure: Left Heart Cath and Coronary Angiography;  Surgeon: Kathleene Hazel, MD;  Location: [facility] INVASIVE CV LAB;  Service: Cardiovascular;  Laterality: N/A;   CARDIOVERSION N/A 09/24/2017   Procedure: CARDIOVERSION;  Surgeon: Laurey Morale, MD;  Location: [facility] ENDOSCOPY;  Service: Cardiovascular;  Laterality: N/A;   DILATION AND CURETTAGE OF UTERUS     ENDARTERECTOMY FEMORAL Right 03/02/2015   Procedure: ENDARTERECTOMY RIGHT FEMORAL;  Surgeon: Chuck Hint, MD;  Location: [facility] OR;  Service: Vascular;  Laterality: Right;  ESOPHAGOGASTRODUODENOSCOPY (EGD) WITH PROPOFOL N/A 12/01/2020   Procedure: ESOPHAGOGASTRODUODENOSCOPY (EGD) WITH PROPOFOL;  Surgeon: Diamantina Monks, MD;  Location: [facility] ENDOSCOPY;  Service: General;  Laterality: N/A;   FACIAL COSMETIC SURGERY Left 2002   "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 33; left eye/side of face droopy; tried to make area symmetrical"   FEMORAL-POPLITEAL BYPASS GRAFT Right 03/02/2015   Procedure: BYPASS GRAFT FEMORAL-BELOW KNEE POPLITEAL ARTERY;  Surgeon: Chuck Hint, MD;  Location: [facility] OR;  Service: Vascular;  Laterality: Right;   INGUINAL HERNIA REPAIR Bilateral 2002   IR ANGIO INTRA EXTRACRAN SEL COM CAROTID INNOMINATE UNI L MOD SED  11/16/2020   IR CT HEAD LTD  11/13/2020   IR PERCUTANEOUS ART THROMBECTOMY/INFUSION INTRACRANIAL INC DIAG ANGIO  11/13/2020   OVARIAN CYST REMOVAL Left    PEG PLACEMENT N/A 12/01/2020   Procedure: PERCUTANEOUS ENDOSCOPIC GASTROSTOMY (PEG) PLACEMENT;  Surgeon: Diamantina Monks, MD;  Location: [facility] ENDOSCOPY;  Service: General;  Laterality: N/A;   PERIPHERAL VASCULAR CATHETERIZATION N/A 01/16/2015   Procedure: Abdominal Aortogram;  Surgeon: Chuck Hint, MD;  Location: [facility] INVASIVE CV LAB;  Service: Cardiovascular;  Laterality: N/A;   POSTERIOR LUMBAR FUSION  2015   "have plates and screws in there"   RADIOLOGY WITH ANESTHESIA N/A 11/13/2020   Procedure: IR WITH ANESTHESIA;  Surgeon: Radiologist, Medication, MD;  Location: [facility] OR;  Service: Radiology;  Laterality: N/A;   TONSILLECTOMY      Allergies  Allergen Reactions   Amoxicillin Other (See Comments)    UTI Has patient had a PCN reaction causing immediate rash, facial/tongue/throat swelling, SOB or lightheadedness with hypotension: No Has patient had a PCN reaction causing severe rash involving mucus membranes or skin necrosis: No Has patient had a PCN reaction that required hospitalization: No Has patient had a PCN reaction occurring within the last 10 years: Yes--UTI ONLY If all of the above answers are "NO", then may proceed with Cephalosporin use.    Atenolol Cough   Crestor [Rosuvastatin Calcium] Other (See Comments)    Muscle aches   Pravastatin Other (See Comments)    Muscle aches   Rosuvastatin Calcium    Sulfa Antibiotics Nausea  Only   Codeine Other (See Comments)    hallucinations    Allergies as of 09/04/2022       Reactions   Amoxicillin Other (See Comments)   UTI Has patient had a PCN reaction causing immediate rash, facial/tongue/throat swelling, SOB or lightheadedness with hypotension: No Has patient had a PCN reaction causing severe rash involving mucus membranes or skin necrosis: No Has patient had a PCN reaction that required hospitalization: No Has patient had a PCN reaction occurring within the last 10 years: Yes--UTI ONLY If all of the above answers are "NO", then may proceed with Cephalosporin use.   Atenolol Cough   Crestor [rosuvastatin Calcium] Other (See Comments)   Muscle aches   Pravastatin Other (See Comments)   Muscle aches   Rosuvastatin Calcium    Sulfa Antibiotics Nausea Only   Codeine Other (See Comments)   hallucinations        Medication List        Accurate as of Sep 04, 2022  2:06 PM. If you have any questions, ask your nurse or doctor.          acetaminophen 325 MG tablet Commonly known as: TYLENOL Take 650 mg by mouth every 6 (six) hours.   aluminum-magnesium hydroxide-simethicone 200-200-20 MG/5ML Susp Commonly known as: MAALOX Take 30 mLs  by mouth every 4 (four) hours as needed (heartburn).   aluminum-magnesium hydroxide-simethicone 200-200-20 MG/5ML Susp Commonly known as: MAALOX Take 30 mLs by mouth every 4 (four) hours as needed.   amiodarone 200 MG tablet Commonly known as: PACERONE Take 200 mg by mouth daily. May give crushed   amLODipine 5 MG tablet Commonly known as: NORVASC Take 5 mg by mouth daily.   apixaban 5 MG Tabs tablet Commonly known as: ELIQUIS Take 5 mg by mouth 2 (two) times daily. May give crushed   bisacodyl 5 MG EC tablet Commonly known as: DULCOLAX Take 5 mg by mouth 3 (three) times daily as needed for moderate constipation.   buPROPion 150 MG 24 hr tablet Commonly known as: WELLBUTRIN XL Take 150 mg by mouth daily.    CeraVe Crea Apply 1 Application topically as needed.   ezetimibe 10 MG tablet Commonly known as: ZETIA Take 10 mg by mouth daily. Oral if crushed   furosemide 20 MG tablet Commonly known as: LASIX Take 20 mg by mouth daily as needed. Give Lasix 20mg  for weight gain equal or greater than 3 pounds   hydrALAZINE 10 MG tablet Commonly known as: APRESOLINE Take 10 mg by mouth 3 (three) times daily as needed (SBP > 165 or DBP > 95).   ipratropium-albuterol 0.5-2.5 (3) MG/3ML Soln Commonly known as: DUONEB Inhale 3 mLs into the lungs every 4 (four) hours as needed.   ketoconazole 2 % cream Commonly known as: NIZORAL Apply 1 application  topically daily as needed for irritation.   nystatin cream Commonly known as: MYCOSTATIN Apply 1 application topically 2 (two) times daily as needed for dry skin (apply to perirectal rash).   ondansetron 4 MG tablet Commonly known as: ZOFRAN Take 4 mg by mouth every 6 (six) hours as needed for nausea or vomiting.   OXYGEN Inhale 3 L into the lungs as directed. to maintain sats > or equal to 90%. May titrate Three Times A Day; 07:00 AM - 03:00 PM, 03:00 PM - 11:00 PM, 11:00 PM - 7:00 am   pantoprazole 40 MG tablet Commonly known as: PROTONIX Take 40 mg by mouth 2 (two) times daily.   polyethylene glycol powder 17 GM/SCOOP powder Commonly known as: GLYCOLAX/MIRALAX Take 17 g by mouth daily.   promethazine 12.5 MG suppository Commonly known as: PHENERGAN Place 12.5 mg rectally every 6 (six) hours as needed for nausea or vomiting.   Stiolto Respimat 2.5-2.5 MCG/ACT Aers Generic drug: Tiotropium Bromide-Olodaterol Inhale 2 puffs into the lungs daily.   Systane 0.4-0.3 % Soln Generic drug: Polyethyl Glycol-Propyl Glycol Place 2 drops into both eyes 4 (four) times daily as needed (irritation).   Voltaren 1 % Gel Generic drug: diclofenac Sodium Apply one application to right hip and knee four times daily as needed.   zinc oxide 20 %  ointment Apply 1 Application topically as needed for irritation.        Review of Systems  Unable to perform ROS: Other  Constitutional:  Negative for activity change and appetite change.  HENT:  Negative for sore throat and trouble swallowing.   Eyes:  Negative for visual disturbance.  Respiratory:  Negative for cough, shortness of breath and wheezing.   Cardiovascular:  Negative for chest pain and leg swelling.  Gastrointestinal:  Negative for abdominal distention and abdominal pain.  Genitourinary:  Negative for dysuria and frequency.  Musculoskeletal:  Positive for gait problem.  Skin:  Positive for rash.  Neurological:  Positive for speech difficulty  and weakness. Negative for dizziness and light-headedness.  Psychiatric/Behavioral:  Positive for dysphoric mood and sleep disturbance. Negative for confusion.     Immunization History  Administered Date(s) Administered   Fluad Quad(high Dose 65+) 02/05/2022   Influenza Split 01/16/2009, 04/30/2010, 01/26/2016, 02/07/2018, 02/01/2020   Influenza, High Dose Seasonal PF 02/07/2018, 01/25/2019, 02/28/2021, 02/05/2022   Influenza,inj,Quad PF,6+ Mos 03/06/2012, 01/26/2016, 01/10/2017   Influenza,inj,quad, With Preservative 02/14/2015   Moderna Covid-19 Vaccine Bivalent Booster 77yrs & up 02/28/2021   PFIZER(Purple Top)SARS-COV-2 Vaccination 05/06/2019, 05/27/2019, 01/27/2020, 09/18/2020   Pneumococcal Conjugate-13 03/19/2017   Pneumococcal Polysaccharide-23 02/14/2015   Respiratory Syncytial Virus Vaccine,Recomb Aduvanted(Arexvy) 02/15/2022   Td 02/25/2005   Tdap 05/19/2013   Zoster, Live 07/28/2006   Pertinent  Health Maintenance Due  Topic Date Due   INFLUENZA VACCINE  11/14/2022   DEXA SCAN  Discontinued      07/08/2021    9:00 AM 07/08/2021    9:30 PM 07/09/2021    8:00 AM 05/07/2022   11:59 AM 08/13/2022   11:02 AM  Fall Risk  Falls in the past year?    0 1  Was there an injury with Fall?    0 0  Fall Risk Category  Calculator    0 1  (RETIRED) Patient Fall Risk Level High fall risk High fall risk Moderate fall risk    Patient at Risk for Falls Due to    No Fall Risks History of fall(s);Impaired balance/gait;Impaired mobility  Patient at Risk for Falls Due to - Comments     right sided hemiparesis  Fall risk Follow up    Falls evaluation completed Falls evaluation completed;Education provided;Falls prevention discussed   Functional Status Survey:    Vitals:   09/04/22 1404  BP: 138/74  Pulse: (!) 48  Resp: 16  Temp: (!) 97.2 F (36.2 C)  SpO2: 94%  Weight: 97 lb 9.6 oz (44.3 kg)  Height: 5\' 3"  (1.6 m)   Body mass index is 17.29 kg/m. Physical Exam Vitals reviewed.  Constitutional:      General: She is not in acute distress. HENT:     Head: Normocephalic.     Right Ear: There is no impacted cerumen.     Left Ear: There is no impacted cerumen.     Nose: Nose normal.     Mouth/Throat:     Mouth: Mucous membranes are moist.  Eyes:     General:        Right eye: No discharge.        Left eye: No discharge.  Cardiovascular:     Rate and Rhythm: Normal rate and regular rhythm.     Pulses: Normal pulses.     Heart sounds: Normal heart sounds.  Pulmonary:     Effort: Pulmonary effort is normal. No respiratory distress.     Breath sounds: Normal breath sounds. No wheezing or rales.     Comments: 2 liters oxyen Abdominal:     General: Bowel sounds are normal.     Palpations: Abdomen is soft.  Musculoskeletal:     Cervical back: Neck supple.     Right lower leg: No edema.     Left lower leg: No edema.  Skin:    General: Skin is warm.     Capillary Refill: Capillary refill takes less than 2 seconds.     Findings: Rash present.     Comments: Increased redness on face where nasal tubing touches, extends down to neck , no open skin or lesions,  skin appears excoriated/flaking near nostrils  Neurological:     General: No focal deficit present.     Mental Status: She is alert.     Motor:  Weakness present.     Gait: Gait abnormal.  Psychiatric:        Mood and Affect: Mood normal.     Comments: Aphasia, follows commands     Labs reviewed: Recent Labs    09/18/21 0000 12/03/21 0000 03/18/22 0500  NA 135* 139 140  K 5.1 4.4 4.3  CL 101 104 106  CO2 27* 27* 26*  BUN 32* 28* 31*  CREATININE 0.9 0.9 0.9  CALCIUM 8.8 9.0 8.9   Recent Labs    12/03/21 0000 12/10/21 0000 03/18/22 0500  AST 38* 32 33  ALT 48* 38* 39*  ALKPHOS 122 122 89  ALBUMIN 3.4* 3.5 3.4*   Recent Labs    03/18/22 0500  WBC 5.9  HGB 13.3  HCT 41  PLT 224   Lab Results  Component Value Date   TSH 0.61 08/08/2022   Lab Results  Component Value Date   HGBA1C 6.0 (H) 11/13/2020   Lab Results  Component Value Date   CHOL 182 11/22/2021   HDL 80 (A) 11/22/2021   LDLCALC 89 11/22/2021   TRIG 64 11/22/2021   CHOLHDL 3.0 11/13/2020    Significant Diagnostic Results in last 30 days:  No results found.  Assessment/Plan 1. Bradycardia - h/o bradycardia 06/2021> metoprolol was discontinued - noted 05/16 and 05/21 - Apical pulse 58 today - on amiodarone - monitor HR TID x 5 days> give to provider    2. Allergic contact dermatitis due to other agents - rash to face 07/2022> resolved with ketoconazole/hydrocortisone mixture - rash where oxygen tubing touches face, extending down to neck - discussed with supply manager> she is getting new ones to try - start ketoconazole 2% cream- apply to face BID x 7 days- mix with hydrocortisone 1%  - recommend dermatology consult  - may need allergy testing if symptoms do not improve  3. Pulmonary emphysema, unspecified emphysema type (HCC) - followed by pulmonary - cont Respimat and duonebs prn  4. Chronic respiratory failure with hypoxia (HCC) - on continuous oxygen  5. 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 Unstable gait - working  with OT - started using PWC  7. Essential hypertension - controlled with amlodipine and hydralazine prn  8. Mixed hyperlipidemia - cont Zetia  - statin intolerance  9. PAF (paroxysmal atrial fibrillation) (HCC) - HR< 100 with amiodarone - cont Eliquis for clot prevention  10. Chronic diastolic congestive heart failure (HCC) - no weight fluctuations, sob, ankle edema - cont furosemide prn  11. Coronary artery disease involving native coronary artery of native heart without angina pectoris - cont eliquis and Zetia  12. Moderate episode of recurrent major depressive disorder (HCC) - no mood changes - cont Wellbutrin  13. Gastroesophageal reflux disease without esophagitis - hgb stable - cont Protonix  14. Primary insomnia - cont Ambien    Family/ staff Communication: plan discussed with patient and nurse  Labs/tests ordered:  cbc/diff, cmp, lipid panel 09/17/2022

## 2022-09-09 DIAGNOSIS — R278 Other lack of coordination: Secondary | ICD-10-CM | POA: Diagnosis not present

## 2022-09-09 DIAGNOSIS — I63312 Cerebral infarction due to thrombosis of left middle cerebral artery: Secondary | ICD-10-CM | POA: Diagnosis not present

## 2022-09-09 DIAGNOSIS — I69351 Hemiplegia and hemiparesis following cerebral infarction affecting right dominant side: Secondary | ICD-10-CM | POA: Diagnosis not present

## 2022-09-09 DIAGNOSIS — I5032 Chronic diastolic (congestive) heart failure: Secondary | ICD-10-CM | POA: Diagnosis not present

## 2022-09-09 DIAGNOSIS — M6389 Disorders of muscle in diseases classified elsewhere, multiple sites: Secondary | ICD-10-CM | POA: Diagnosis not present

## 2022-09-10 DIAGNOSIS — M6389 Disorders of muscle in diseases classified elsewhere, multiple sites: Secondary | ICD-10-CM | POA: Diagnosis not present

## 2022-09-10 DIAGNOSIS — I5032 Chronic diastolic (congestive) heart failure: Secondary | ICD-10-CM | POA: Diagnosis not present

## 2022-09-10 DIAGNOSIS — I69351 Hemiplegia and hemiparesis following cerebral infarction affecting right dominant side: Secondary | ICD-10-CM | POA: Diagnosis not present

## 2022-09-10 DIAGNOSIS — I63312 Cerebral infarction due to thrombosis of left middle cerebral artery: Secondary | ICD-10-CM | POA: Diagnosis not present

## 2022-09-10 DIAGNOSIS — R278 Other lack of coordination: Secondary | ICD-10-CM | POA: Diagnosis not present

## 2022-09-12 DIAGNOSIS — I480 Paroxysmal atrial fibrillation: Secondary | ICD-10-CM | POA: Diagnosis not present

## 2022-09-12 DIAGNOSIS — I5032 Chronic diastolic (congestive) heart failure: Secondary | ICD-10-CM | POA: Diagnosis not present

## 2022-09-12 DIAGNOSIS — I63312 Cerebral infarction due to thrombosis of left middle cerebral artery: Secondary | ICD-10-CM | POA: Diagnosis not present

## 2022-09-12 DIAGNOSIS — R278 Other lack of coordination: Secondary | ICD-10-CM | POA: Diagnosis not present

## 2022-09-12 DIAGNOSIS — I69351 Hemiplegia and hemiparesis following cerebral infarction affecting right dominant side: Secondary | ICD-10-CM | POA: Diagnosis not present

## 2022-09-12 DIAGNOSIS — I6602 Occlusion and stenosis of left middle cerebral artery: Secondary | ICD-10-CM | POA: Diagnosis not present

## 2022-09-12 DIAGNOSIS — M6389 Disorders of muscle in diseases classified elsewhere, multiple sites: Secondary | ICD-10-CM | POA: Diagnosis not present

## 2022-09-12 DIAGNOSIS — R471 Dysarthria and anarthria: Secondary | ICD-10-CM | POA: Diagnosis not present

## 2022-09-12 DIAGNOSIS — R482 Apraxia: Secondary | ICD-10-CM | POA: Diagnosis not present

## 2022-09-13 DIAGNOSIS — M6389 Disorders of muscle in diseases classified elsewhere, multiple sites: Secondary | ICD-10-CM | POA: Diagnosis not present

## 2022-09-13 DIAGNOSIS — I69351 Hemiplegia and hemiparesis following cerebral infarction affecting right dominant side: Secondary | ICD-10-CM | POA: Diagnosis not present

## 2022-09-13 DIAGNOSIS — I63312 Cerebral infarction due to thrombosis of left middle cerebral artery: Secondary | ICD-10-CM | POA: Diagnosis not present

## 2022-09-13 DIAGNOSIS — R278 Other lack of coordination: Secondary | ICD-10-CM | POA: Diagnosis not present

## 2022-09-13 DIAGNOSIS — I5032 Chronic diastolic (congestive) heart failure: Secondary | ICD-10-CM | POA: Diagnosis not present

## 2022-09-16 ENCOUNTER — Ambulatory Visit: Payer: Medicare Other | Admitting: Podiatry

## 2022-09-17 DIAGNOSIS — I69351 Hemiplegia and hemiparesis following cerebral infarction affecting right dominant side: Secondary | ICD-10-CM | POA: Diagnosis not present

## 2022-09-17 DIAGNOSIS — I5032 Chronic diastolic (congestive) heart failure: Secondary | ICD-10-CM | POA: Diagnosis not present

## 2022-09-17 DIAGNOSIS — R293 Abnormal posture: Secondary | ICD-10-CM | POA: Diagnosis not present

## 2022-09-17 DIAGNOSIS — R278 Other lack of coordination: Secondary | ICD-10-CM | POA: Diagnosis not present

## 2022-09-17 DIAGNOSIS — M6389 Disorders of muscle in diseases classified elsewhere, multiple sites: Secondary | ICD-10-CM | POA: Diagnosis not present

## 2022-09-17 DIAGNOSIS — I63312 Cerebral infarction due to thrombosis of left middle cerebral artery: Secondary | ICD-10-CM | POA: Diagnosis not present

## 2022-09-19 DIAGNOSIS — I69351 Hemiplegia and hemiparesis following cerebral infarction affecting right dominant side: Secondary | ICD-10-CM | POA: Diagnosis not present

## 2022-09-19 DIAGNOSIS — E785 Hyperlipidemia, unspecified: Secondary | ICD-10-CM | POA: Diagnosis not present

## 2022-09-19 DIAGNOSIS — R278 Other lack of coordination: Secondary | ICD-10-CM | POA: Diagnosis not present

## 2022-09-19 DIAGNOSIS — R482 Apraxia: Secondary | ICD-10-CM | POA: Diagnosis not present

## 2022-09-19 DIAGNOSIS — R471 Dysarthria and anarthria: Secondary | ICD-10-CM | POA: Diagnosis not present

## 2022-09-19 DIAGNOSIS — I480 Paroxysmal atrial fibrillation: Secondary | ICD-10-CM | POA: Diagnosis not present

## 2022-09-19 DIAGNOSIS — M6389 Disorders of muscle in diseases classified elsewhere, multiple sites: Secondary | ICD-10-CM | POA: Diagnosis not present

## 2022-09-19 DIAGNOSIS — I63312 Cerebral infarction due to thrombosis of left middle cerebral artery: Secondary | ICD-10-CM | POA: Diagnosis not present

## 2022-09-19 DIAGNOSIS — I5032 Chronic diastolic (congestive) heart failure: Secondary | ICD-10-CM | POA: Diagnosis not present

## 2022-09-19 DIAGNOSIS — I6602 Occlusion and stenosis of left middle cerebral artery: Secondary | ICD-10-CM | POA: Diagnosis not present

## 2022-09-19 DIAGNOSIS — I10 Essential (primary) hypertension: Secondary | ICD-10-CM | POA: Diagnosis not present

## 2022-09-19 LAB — CBC AND DIFFERENTIAL
HCT: 43 (ref 36–46)
Hemoglobin: 14 (ref 12.0–16.0)
Platelets: 246 10*3/uL (ref 150–400)
WBC: 7.4

## 2022-09-19 LAB — CBC: RBC: 4.64 (ref 3.87–5.11)

## 2022-09-20 DIAGNOSIS — I5032 Chronic diastolic (congestive) heart failure: Secondary | ICD-10-CM | POA: Diagnosis not present

## 2022-09-20 DIAGNOSIS — M6389 Disorders of muscle in diseases classified elsewhere, multiple sites: Secondary | ICD-10-CM | POA: Diagnosis not present

## 2022-09-20 DIAGNOSIS — I63312 Cerebral infarction due to thrombosis of left middle cerebral artery: Secondary | ICD-10-CM | POA: Diagnosis not present

## 2022-09-20 DIAGNOSIS — I69351 Hemiplegia and hemiparesis following cerebral infarction affecting right dominant side: Secondary | ICD-10-CM | POA: Diagnosis not present

## 2022-09-20 DIAGNOSIS — R278 Other lack of coordination: Secondary | ICD-10-CM | POA: Diagnosis not present

## 2022-09-23 DIAGNOSIS — M6389 Disorders of muscle in diseases classified elsewhere, multiple sites: Secondary | ICD-10-CM | POA: Diagnosis not present

## 2022-09-23 DIAGNOSIS — R278 Other lack of coordination: Secondary | ICD-10-CM | POA: Diagnosis not present

## 2022-09-23 DIAGNOSIS — I63312 Cerebral infarction due to thrombosis of left middle cerebral artery: Secondary | ICD-10-CM | POA: Diagnosis not present

## 2022-09-23 DIAGNOSIS — I69351 Hemiplegia and hemiparesis following cerebral infarction affecting right dominant side: Secondary | ICD-10-CM | POA: Diagnosis not present

## 2022-09-23 DIAGNOSIS — I5032 Chronic diastolic (congestive) heart failure: Secondary | ICD-10-CM | POA: Diagnosis not present

## 2022-09-24 DIAGNOSIS — I6602 Occlusion and stenosis of left middle cerebral artery: Secondary | ICD-10-CM | POA: Diagnosis not present

## 2022-09-24 DIAGNOSIS — R471 Dysarthria and anarthria: Secondary | ICD-10-CM | POA: Diagnosis not present

## 2022-09-24 DIAGNOSIS — R482 Apraxia: Secondary | ICD-10-CM | POA: Diagnosis not present

## 2022-09-24 DIAGNOSIS — I69351 Hemiplegia and hemiparesis following cerebral infarction affecting right dominant side: Secondary | ICD-10-CM | POA: Diagnosis not present

## 2022-09-24 DIAGNOSIS — I480 Paroxysmal atrial fibrillation: Secondary | ICD-10-CM | POA: Diagnosis not present

## 2022-09-24 DIAGNOSIS — R278 Other lack of coordination: Secondary | ICD-10-CM | POA: Diagnosis not present

## 2022-09-24 DIAGNOSIS — I63312 Cerebral infarction due to thrombosis of left middle cerebral artery: Secondary | ICD-10-CM | POA: Diagnosis not present

## 2022-09-24 DIAGNOSIS — M6389 Disorders of muscle in diseases classified elsewhere, multiple sites: Secondary | ICD-10-CM | POA: Diagnosis not present

## 2022-09-24 DIAGNOSIS — I5032 Chronic diastolic (congestive) heart failure: Secondary | ICD-10-CM | POA: Diagnosis not present

## 2022-09-25 DIAGNOSIS — M6389 Disorders of muscle in diseases classified elsewhere, multiple sites: Secondary | ICD-10-CM | POA: Diagnosis not present

## 2022-09-25 DIAGNOSIS — I69351 Hemiplegia and hemiparesis following cerebral infarction affecting right dominant side: Secondary | ICD-10-CM | POA: Diagnosis not present

## 2022-09-25 DIAGNOSIS — I63312 Cerebral infarction due to thrombosis of left middle cerebral artery: Secondary | ICD-10-CM | POA: Diagnosis not present

## 2022-09-25 DIAGNOSIS — I5032 Chronic diastolic (congestive) heart failure: Secondary | ICD-10-CM | POA: Diagnosis not present

## 2022-09-25 DIAGNOSIS — R278 Other lack of coordination: Secondary | ICD-10-CM | POA: Diagnosis not present

## 2022-09-26 ENCOUNTER — Other Ambulatory Visit: Payer: Self-pay | Admitting: Orthopedic Surgery

## 2022-09-26 DIAGNOSIS — J9611 Chronic respiratory failure with hypoxia: Secondary | ICD-10-CM | POA: Diagnosis not present

## 2022-09-26 DIAGNOSIS — I69351 Hemiplegia and hemiparesis following cerebral infarction affecting right dominant side: Secondary | ICD-10-CM | POA: Diagnosis not present

## 2022-09-26 DIAGNOSIS — R278 Other lack of coordination: Secondary | ICD-10-CM | POA: Diagnosis not present

## 2022-09-26 DIAGNOSIS — M6389 Disorders of muscle in diseases classified elsewhere, multiple sites: Secondary | ICD-10-CM | POA: Diagnosis not present

## 2022-09-26 DIAGNOSIS — I5032 Chronic diastolic (congestive) heart failure: Secondary | ICD-10-CM | POA: Diagnosis not present

## 2022-09-26 DIAGNOSIS — I63312 Cerebral infarction due to thrombosis of left middle cerebral artery: Secondary | ICD-10-CM | POA: Diagnosis not present

## 2022-09-27 DIAGNOSIS — I5032 Chronic diastolic (congestive) heart failure: Secondary | ICD-10-CM | POA: Diagnosis not present

## 2022-09-27 DIAGNOSIS — M6389 Disorders of muscle in diseases classified elsewhere, multiple sites: Secondary | ICD-10-CM | POA: Diagnosis not present

## 2022-09-27 DIAGNOSIS — R471 Dysarthria and anarthria: Secondary | ICD-10-CM | POA: Diagnosis not present

## 2022-09-27 DIAGNOSIS — I6602 Occlusion and stenosis of left middle cerebral artery: Secondary | ICD-10-CM | POA: Diagnosis not present

## 2022-09-27 DIAGNOSIS — I480 Paroxysmal atrial fibrillation: Secondary | ICD-10-CM | POA: Diagnosis not present

## 2022-09-27 DIAGNOSIS — R482 Apraxia: Secondary | ICD-10-CM | POA: Diagnosis not present

## 2022-09-27 DIAGNOSIS — R278 Other lack of coordination: Secondary | ICD-10-CM | POA: Diagnosis not present

## 2022-09-27 DIAGNOSIS — I63312 Cerebral infarction due to thrombosis of left middle cerebral artery: Secondary | ICD-10-CM | POA: Diagnosis not present

## 2022-09-27 DIAGNOSIS — I69351 Hemiplegia and hemiparesis following cerebral infarction affecting right dominant side: Secondary | ICD-10-CM | POA: Diagnosis not present

## 2022-09-30 DIAGNOSIS — R278 Other lack of coordination: Secondary | ICD-10-CM | POA: Diagnosis not present

## 2022-09-30 DIAGNOSIS — I5032 Chronic diastolic (congestive) heart failure: Secondary | ICD-10-CM | POA: Diagnosis not present

## 2022-09-30 DIAGNOSIS — I69351 Hemiplegia and hemiparesis following cerebral infarction affecting right dominant side: Secondary | ICD-10-CM | POA: Diagnosis not present

## 2022-09-30 DIAGNOSIS — I63312 Cerebral infarction due to thrombosis of left middle cerebral artery: Secondary | ICD-10-CM | POA: Diagnosis not present

## 2022-09-30 DIAGNOSIS — M6389 Disorders of muscle in diseases classified elsewhere, multiple sites: Secondary | ICD-10-CM | POA: Diagnosis not present

## 2022-10-01 DIAGNOSIS — I480 Paroxysmal atrial fibrillation: Secondary | ICD-10-CM | POA: Diagnosis not present

## 2022-10-01 DIAGNOSIS — I6602 Occlusion and stenosis of left middle cerebral artery: Secondary | ICD-10-CM | POA: Diagnosis not present

## 2022-10-01 DIAGNOSIS — R471 Dysarthria and anarthria: Secondary | ICD-10-CM | POA: Diagnosis not present

## 2022-10-01 DIAGNOSIS — I63312 Cerebral infarction due to thrombosis of left middle cerebral artery: Secondary | ICD-10-CM | POA: Diagnosis not present

## 2022-10-01 DIAGNOSIS — R482 Apraxia: Secondary | ICD-10-CM | POA: Diagnosis not present

## 2022-10-01 DIAGNOSIS — I5032 Chronic diastolic (congestive) heart failure: Secondary | ICD-10-CM | POA: Diagnosis not present

## 2022-10-01 DIAGNOSIS — M6389 Disorders of muscle in diseases classified elsewhere, multiple sites: Secondary | ICD-10-CM | POA: Diagnosis not present

## 2022-10-01 DIAGNOSIS — I69351 Hemiplegia and hemiparesis following cerebral infarction affecting right dominant side: Secondary | ICD-10-CM | POA: Diagnosis not present

## 2022-10-01 DIAGNOSIS — R278 Other lack of coordination: Secondary | ICD-10-CM | POA: Diagnosis not present

## 2022-10-02 DIAGNOSIS — I5032 Chronic diastolic (congestive) heart failure: Secondary | ICD-10-CM | POA: Diagnosis not present

## 2022-10-02 DIAGNOSIS — I69351 Hemiplegia and hemiparesis following cerebral infarction affecting right dominant side: Secondary | ICD-10-CM | POA: Diagnosis not present

## 2022-10-02 DIAGNOSIS — R278 Other lack of coordination: Secondary | ICD-10-CM | POA: Diagnosis not present

## 2022-10-02 DIAGNOSIS — I63312 Cerebral infarction due to thrombosis of left middle cerebral artery: Secondary | ICD-10-CM | POA: Diagnosis not present

## 2022-10-02 DIAGNOSIS — M6389 Disorders of muscle in diseases classified elsewhere, multiple sites: Secondary | ICD-10-CM | POA: Diagnosis not present

## 2022-10-03 DIAGNOSIS — R278 Other lack of coordination: Secondary | ICD-10-CM | POA: Diagnosis not present

## 2022-10-03 DIAGNOSIS — I5032 Chronic diastolic (congestive) heart failure: Secondary | ICD-10-CM | POA: Diagnosis not present

## 2022-10-03 DIAGNOSIS — I69351 Hemiplegia and hemiparesis following cerebral infarction affecting right dominant side: Secondary | ICD-10-CM | POA: Diagnosis not present

## 2022-10-03 DIAGNOSIS — J9611 Chronic respiratory failure with hypoxia: Secondary | ICD-10-CM | POA: Diagnosis not present

## 2022-10-03 DIAGNOSIS — M6389 Disorders of muscle in diseases classified elsewhere, multiple sites: Secondary | ICD-10-CM | POA: Diagnosis not present

## 2022-10-03 DIAGNOSIS — R471 Dysarthria and anarthria: Secondary | ICD-10-CM | POA: Diagnosis not present

## 2022-10-03 DIAGNOSIS — R482 Apraxia: Secondary | ICD-10-CM | POA: Diagnosis not present

## 2022-10-03 DIAGNOSIS — I63312 Cerebral infarction due to thrombosis of left middle cerebral artery: Secondary | ICD-10-CM | POA: Diagnosis not present

## 2022-10-03 DIAGNOSIS — I480 Paroxysmal atrial fibrillation: Secondary | ICD-10-CM | POA: Diagnosis not present

## 2022-10-03 DIAGNOSIS — I6602 Occlusion and stenosis of left middle cerebral artery: Secondary | ICD-10-CM | POA: Diagnosis not present

## 2022-10-07 DIAGNOSIS — I5032 Chronic diastolic (congestive) heart failure: Secondary | ICD-10-CM | POA: Diagnosis not present

## 2022-10-07 DIAGNOSIS — R278 Other lack of coordination: Secondary | ICD-10-CM | POA: Diagnosis not present

## 2022-10-07 DIAGNOSIS — I63312 Cerebral infarction due to thrombosis of left middle cerebral artery: Secondary | ICD-10-CM | POA: Diagnosis not present

## 2022-10-07 DIAGNOSIS — M6389 Disorders of muscle in diseases classified elsewhere, multiple sites: Secondary | ICD-10-CM | POA: Diagnosis not present

## 2022-10-07 DIAGNOSIS — I69351 Hemiplegia and hemiparesis following cerebral infarction affecting right dominant side: Secondary | ICD-10-CM | POA: Diagnosis not present

## 2022-10-08 ENCOUNTER — Encounter: Payer: Self-pay | Admitting: Orthopedic Surgery

## 2022-10-08 NOTE — Progress Notes (Signed)
This encounter was created in error - please disregard.

## 2022-10-08 NOTE — Progress Notes (Signed)
[facility] Room Number: 116-A Place of Service:  SNF [phone]) Provider:  Hazle Nordmann, NP  XWR:[id number], Freddie Breech, MD  Patient Care Team: [name], MD as PCP - General (Internal Medicine) [name], MD as PCP - Cardiology (Cardiology) Duke Salvia, MD as PCP - Electrophysiology (Cardiology) Shirlean Kelly, MD as Consulting Physician (Neurosurgery)  Extended Emergency Contact Information Primary Emergency Contact: Schechter,[name] Address: [address]          [place] [phone] [name] Home Phone: [phone] Mobile Phone: [phone] Relation: Spouse Secondary Emergency Contact: Eddings,[name] Address: [address]          [place], [place] [address] [name] Mobile Phone: [phone] Relation: Son  Code Status:  FULL CODE Goals of care: Advanced Directive information    10/08/2022   10:04 AM  Advanced Directives  Does Patient Have a Medical Advance Directive? Yes  Type of Advance Directive Living will  Does patient want to make changes to medical advance directive? No - Patient declined     Chief Complaint  Patient presents with   Medical Management of Chronic Issues    Routine Visit.    Immunizations    Discuss the need for Shingrix vaccine.     HPI:  Pt is a 77 y.o. female seen today for medical management of chronic diseases.     Past Medical History:  Diagnosis Date   Anxiety    Arthritis    "some in my lower back; probably elbows, knees" (11/18/2017)   Atrial fibrillation (HCC)    Bell's palsy    when pt. was 77 yrs old, when under stress the left side of face will droop.   Complication of anesthesia    "vascular OR 2016; BP bottomed out; couldn't get it regulated; ended up in ICU for DAYS" (11/18/2017)   GERD (gastroesophageal reflux disease)    History of kidney stones    Hypertension    Hypertrophic cardiomyopathy (HCC)    severe LV basilar  hypertrophy witn no evidence of significant outflow tract obstruction, EF 65-70%, mild LAE, mild TR, grade 1a diastolic dysfunction 05/15/10 (Dr. Donato Schultz) (Atrial Septal Hypertrophy pattern)-- Intra-op TEE with dsignificant outflow tract obstruction - AI, MR & TR   Insomnia    Mild aortic sclerosis    Osteopenia    Peripheral vascular disease (HCC)    Syncope    , Vagal   Past Surgical History:  Procedure Laterality Date   AUGMENTATION MAMMAPLASTY Bilateral    BACK SURGERY     CARDIAC CATHETERIZATION N/A 05/07/2016   Procedure: Left Heart Cath and Coronary Angiography;  Surgeon: Kathleene Hazel, MD;  Location: [facility] INVASIVE CV LAB;  Service: Cardiovascular;  Laterality: N/A;   CARDIOVERSION N/A 09/24/2017   Procedure: CARDIOVERSION;  Surgeon: Laurey Morale, MD;  Location: [facility] ENDOSCOPY;  Service: Cardiovascular;  Laterality: N/A;   DILATION AND CURETTAGE OF UTERUS     ENDARTERECTOMY FEMORAL Right 03/02/2015   Procedure: ENDARTERECTOMY RIGHT FEMORAL;  Surgeon: Chuck Hint, MD;  Location: [facility] OR;  Service: Vascular;  Laterality: Right;   ESOPHAGOGASTRODUODENOSCOPY (EGD) WITH PROPOFOL N/A 12/01/2020   Procedure: ESOPHAGOGASTRODUODENOSCOPY (EGD) WITH PROPOFOL;  Surgeon: Diamantina Monks, MD;  Location: [facility] ENDOSCOPY;  Service: General;  Laterality: N/A;   FACIAL COSMETIC SURGERY Left 2002   "related to Bell's Palsy @ age 29; left eye/side of face droopy; tried to make area symmetrical"  FEMORAL-POPLITEAL BYPASS GRAFT Right 03/02/2015   Procedure: BYPASS GRAFT FEMORAL-BELOW KNEE POPLITEAL ARTERY;  Surgeon: Chuck Hint, MD;  Location: [facility] OR;  Service: Vascular;  Laterality: Right;   INGUINAL HERNIA REPAIR Bilateral 2002   IR ANGIO INTRA EXTRACRAN SEL COM CAROTID INNOMINATE UNI L MOD SED  11/16/2020   IR CT HEAD LTD  11/13/2020   IR PERCUTANEOUS ART THROMBECTOMY/INFUSION INTRACRANIAL INC DIAG ANGIO  11/13/2020   OVARIAN CYST REMOVAL Left    PEG PLACEMENT N/A 12/01/2020    Procedure: PERCUTANEOUS ENDOSCOPIC GASTROSTOMY (PEG) PLACEMENT;  Surgeon: Diamantina Monks, MD;  Location: [facility] ENDOSCOPY;  Service: General;  Laterality: N/A;   PERIPHERAL VASCULAR CATHETERIZATION N/A 01/16/2015   Procedure: Abdominal Aortogram;  Surgeon: Chuck Hint, MD;  Location: [facility] INVASIVE CV LAB;  Service: Cardiovascular;  Laterality: N/A;   POSTERIOR LUMBAR FUSION  2015   "have plates and screws in there"   RADIOLOGY WITH ANESTHESIA N/A 11/13/2020   Procedure: IR WITH ANESTHESIA;  Surgeon: Radiologist, Medication, MD;  Location: [facility] OR;  Service: Radiology;  Laterality: N/A;   TONSILLECTOMY      Allergies  Allergen Reactions   Amoxicillin Other (See Comments)    UTI Has patient had a PCN reaction causing immediate rash, facial/tongue/throat swelling, SOB or lightheadedness with hypotension: No Has patient had a PCN reaction causing severe rash involving mucus membranes or skin necrosis: No Has patient had a PCN reaction that required hospitalization: No Has patient had a PCN reaction occurring within the last 10 years: Yes--UTI ONLY If all of the above answers are "NO", then may proceed with Cephalosporin use.    Atenolol Cough   Crestor [Rosuvastatin Calcium] Other (See Comments)    Muscle aches   Pravastatin Other (See Comments)    Muscle aches   Rosuvastatin Calcium    Sulfa Antibiotics Nausea Only   Codeine Other (See Comments)    hallucinations    Allergies as of 10/08/2022       Reactions   Amoxicillin Other (See Comments)   UTI Has patient had a PCN reaction causing immediate rash, facial/tongue/throat swelling, SOB or lightheadedness with hypotension: No Has patient had a PCN reaction causing severe rash involving mucus membranes or skin necrosis: No Has patient had a PCN reaction that required hospitalization: No Has patient had a PCN reaction occurring within the last 10 years: Yes--UTI ONLY If all of the above answers are "NO", then may proceed with  Cephalosporin use.   Atenolol Cough   Crestor [rosuvastatin Calcium] Other (See Comments)   Muscle aches   Pravastatin Other (See Comments)   Muscle aches   Rosuvastatin Calcium    Sulfa Antibiotics Nausea Only   Codeine Other (See Comments)   hallucinations        Medication List        Accurate as of October 08, 2022 10:04 AM. If you have any questions, ask your nurse or doctor.          STOP taking these medications    aluminum-magnesium hydroxide-simethicone 200-200-20 MG/5ML Susp Commonly known as: MAALOX Stopped by: Octavia Heir, NP   promethazine 12.5 MG suppository Commonly known as: PHENERGAN Stopped by: Octavia Heir, NP   Systane 0.4-0.3 % Soln Generic drug: Polyethyl Glycol-Propyl Glycol Stopped by: Octavia Heir, NP   zinc oxide 20 % ointment Stopped by: Octavia Heir, NP       TAKE these medications    acetaminophen 325 MG tablet Commonly known as: TYLENOL Take  650 mg by mouth every 6 (six) hours.   amiodarone 200 MG tablet Commonly known as: PACERONE Take 200 mg by mouth daily. May give crushed   amLODipine 5 MG tablet Commonly known as: NORVASC Take 5 mg by mouth daily.   apixaban 5 MG Tabs tablet Commonly known as: ELIQUIS Take 5 mg by mouth 2 (two) times daily. May give crushed   bisacodyl 5 MG EC tablet Commonly known as: DULCOLAX Take 5 mg by mouth 2 (two) times daily as needed for moderate constipation.   buPROPion 150 MG 24 hr tablet Commonly known as: WELLBUTRIN XL Take 150 mg by mouth daily.   CeraVe Crea Apply 1 Application topically as needed.   ezetimibe 10 MG tablet Commonly known as: ZETIA Take 10 mg by mouth daily. Oral if crushed   furosemide 20 MG tablet Commonly known as: LASIX Take 20 mg by mouth daily as needed. Give Lasix 20mg  for weight gain equal or greater than 3 pounds   hydrALAZINE 10 MG tablet Commonly known as: APRESOLINE Take 10 mg by mouth 3 (three) times daily as needed (SBP > 165 or DBP > 95).    hydrocortisone cream 1 % Apply 1 Application topically 2 (two) times daily.   hydrocortisone cream 1 % Apply 1 Application topically 2 (two) times daily as needed for itching.   ipratropium-albuterol 0.5-2.5 (3) MG/3ML Soln Commonly known as: DUONEB Inhale 3 mLs into the lungs every 4 (four) hours as needed.   ketoconazole 2 % cream Commonly known as: NIZORAL Apply 1 application  topically daily as needed for irritation.   nystatin cream Commonly known as: MYCOSTATIN Apply 1 application topically 2 (two) times daily as needed for dry skin (apply to perirectal rash).   ondansetron 4 MG tablet Commonly known as: ZOFRAN Take 4 mg by mouth every 6 (six) hours as needed for nausea or vomiting.   OXYGEN Inhale 3 L into the lungs as directed. to maintain sats > or equal to 90%. May titrate Three Times A Day; 07:00 AM - 03:00 PM, 03:00 PM - 11:00 PM, 11:00 PM - 7:00 am   pantoprazole 40 MG tablet Commonly known as: PROTONIX Take 40 mg by mouth 2 (two) times daily.   polyethylene glycol powder 17 GM/SCOOP powder Commonly known as: GLYCOLAX/MIRALAX Take 17 g by mouth daily.   Stiolto Respimat 2.5-2.5 MCG/ACT Aers Generic drug: Tiotropium Bromide-Olodaterol Inhale 2 puffs into the lungs daily.   Voltaren 1 % Gel Generic drug: diclofenac Sodium Apply one application to right hip and knee four times daily as needed.        Review of Systems  Immunization History  Administered Date(s) Administered   Fluad Quad(high Dose 65+) 02/05/2022   Influenza Split 01/16/2009, 04/30/2010, 01/26/2016, 02/07/2018, 02/01/2020   Influenza, High Dose Seasonal PF 02/07/2018, 01/25/2019, 02/28/2021, 02/05/2022   Influenza,inj,Quad PF,6+ Mos 03/06/2012, 01/26/2016, 01/10/2017   Influenza,inj,quad, With Preservative 02/14/2015   Moderna Covid-19 Vaccine Bivalent Booster 57yrs & up 02/28/2021   PFIZER(Purple Top)SARS-COV-2 Vaccination 05/06/2019, 05/27/2019, 01/27/2020, 09/18/2020    Pneumococcal Conjugate-13 03/19/2017   Pneumococcal Polysaccharide-23 02/14/2015   Respiratory Syncytial Virus Vaccine,Recomb Aduvanted(Arexvy) 02/15/2022   Td 02/25/2005   Tdap 05/19/2013   Zoster, Live 07/28/2006   Pertinent  Health Maintenance Due  Topic Date Due   INFLUENZA VACCINE  11/14/2022   DEXA SCAN  Discontinued      07/08/2021    9:00 AM 07/08/2021    9:30 PM 07/09/2021    8:00 AM 05/07/2022  11:59 AM 08/13/2022   11:02 AM  Fall Risk  Falls in the past year?    0 1  Was there an injury with Fall?    0 0  Fall Risk Category Calculator    0 1  (RETIRED) Patient Fall Risk Level High fall risk High fall risk Moderate fall risk    Patient at Risk for Falls Due to    No Fall Risks History of fall(s);Impaired balance/gait;Impaired mobility  Patient at Risk for Falls Due to - Comments     right sided hemiparesis  Fall risk Follow up    Falls evaluation completed Falls evaluation completed;Education provided;Falls prevention discussed   Functional Status Survey:    Vitals:   10/08/22 0952  BP: 134/78  Pulse: (!) 55  Resp: 18  Temp: (!) 97.3 F (36.3 C)  SpO2: 96%  Weight: 99 lb 6.4 oz (45.1 kg)  Height: 5\' 3"  (1.6 m)   Body mass index is 17.61 kg/m. Physical Exam  Labs reviewed: Recent Labs    12/03/21 0000 03/18/22 0500  NA 139 140  K 4.4 4.3  CL 104 106  CO2 27* 26*  BUN 28* 31*  CREATININE 0.9 0.9  CALCIUM 9.0 8.9   Recent Labs    12/03/21 0000 12/10/21 0000 03/18/22 0500  AST 38* 32 33  ALT 48* 38* 39*  ALKPHOS 122 122 89  ALBUMIN 3.4* 3.5 3.4*   Recent Labs    03/18/22 0500 09/19/22 0000  WBC 5.9 7.4  HGB 13.3 14.0  HCT 41 43  PLT 224 246   Lab Results  Component Value Date   TSH 0.61 08/08/2022   Lab Results  Component Value Date   HGBA1C 6.0 (H) 11/13/2020   Lab Results  Component Value Date   CHOL 182 11/22/2021   HDL 80 (A) 11/22/2021   LDLCALC 89 11/22/2021   TRIG 64 11/22/2021   CHOLHDL 3.0 11/13/2020     Significant Diagnostic Results in last 30 days:  No results found.  Assessment/Plan There are no diagnoses linked to this encounter.   Family/ staff Communication:   Labs/tests ordered:

## 2022-10-09 DIAGNOSIS — I63312 Cerebral infarction due to thrombosis of left middle cerebral artery: Secondary | ICD-10-CM | POA: Diagnosis not present

## 2022-10-09 DIAGNOSIS — M6389 Disorders of muscle in diseases classified elsewhere, multiple sites: Secondary | ICD-10-CM | POA: Diagnosis not present

## 2022-10-09 DIAGNOSIS — R278 Other lack of coordination: Secondary | ICD-10-CM | POA: Diagnosis not present

## 2022-10-09 DIAGNOSIS — I5032 Chronic diastolic (congestive) heart failure: Secondary | ICD-10-CM | POA: Diagnosis not present

## 2022-10-09 DIAGNOSIS — I69351 Hemiplegia and hemiparesis following cerebral infarction affecting right dominant side: Secondary | ICD-10-CM | POA: Diagnosis not present

## 2022-10-10 DIAGNOSIS — I5032 Chronic diastolic (congestive) heart failure: Secondary | ICD-10-CM | POA: Diagnosis not present

## 2022-10-10 DIAGNOSIS — R278 Other lack of coordination: Secondary | ICD-10-CM | POA: Diagnosis not present

## 2022-10-10 DIAGNOSIS — I69351 Hemiplegia and hemiparesis following cerebral infarction affecting right dominant side: Secondary | ICD-10-CM | POA: Diagnosis not present

## 2022-10-10 DIAGNOSIS — I63312 Cerebral infarction due to thrombosis of left middle cerebral artery: Secondary | ICD-10-CM | POA: Diagnosis not present

## 2022-10-10 DIAGNOSIS — J9611 Chronic respiratory failure with hypoxia: Secondary | ICD-10-CM | POA: Diagnosis not present

## 2022-10-11 DIAGNOSIS — I480 Paroxysmal atrial fibrillation: Secondary | ICD-10-CM | POA: Diagnosis not present

## 2022-10-11 DIAGNOSIS — I6602 Occlusion and stenosis of left middle cerebral artery: Secondary | ICD-10-CM | POA: Diagnosis not present

## 2022-10-11 DIAGNOSIS — R471 Dysarthria and anarthria: Secondary | ICD-10-CM | POA: Diagnosis not present

## 2022-10-11 DIAGNOSIS — R278 Other lack of coordination: Secondary | ICD-10-CM | POA: Diagnosis not present

## 2022-10-11 DIAGNOSIS — I63312 Cerebral infarction due to thrombosis of left middle cerebral artery: Secondary | ICD-10-CM | POA: Diagnosis not present

## 2022-10-11 DIAGNOSIS — I69351 Hemiplegia and hemiparesis following cerebral infarction affecting right dominant side: Secondary | ICD-10-CM | POA: Diagnosis not present

## 2022-10-11 DIAGNOSIS — M6389 Disorders of muscle in diseases classified elsewhere, multiple sites: Secondary | ICD-10-CM | POA: Diagnosis not present

## 2022-10-11 DIAGNOSIS — R482 Apraxia: Secondary | ICD-10-CM | POA: Diagnosis not present

## 2022-10-11 DIAGNOSIS — I5032 Chronic diastolic (congestive) heart failure: Secondary | ICD-10-CM | POA: Diagnosis not present

## 2022-10-12 IMAGING — DX DG CHEST 1V PORT
1 series · 1 of 1 positions shown · non-contrast
Comparison: 11/25/2020

CLINICAL DATA: Shortness of breath

EXAM:
PORTABLE CHEST 1 VIEW

[chest]
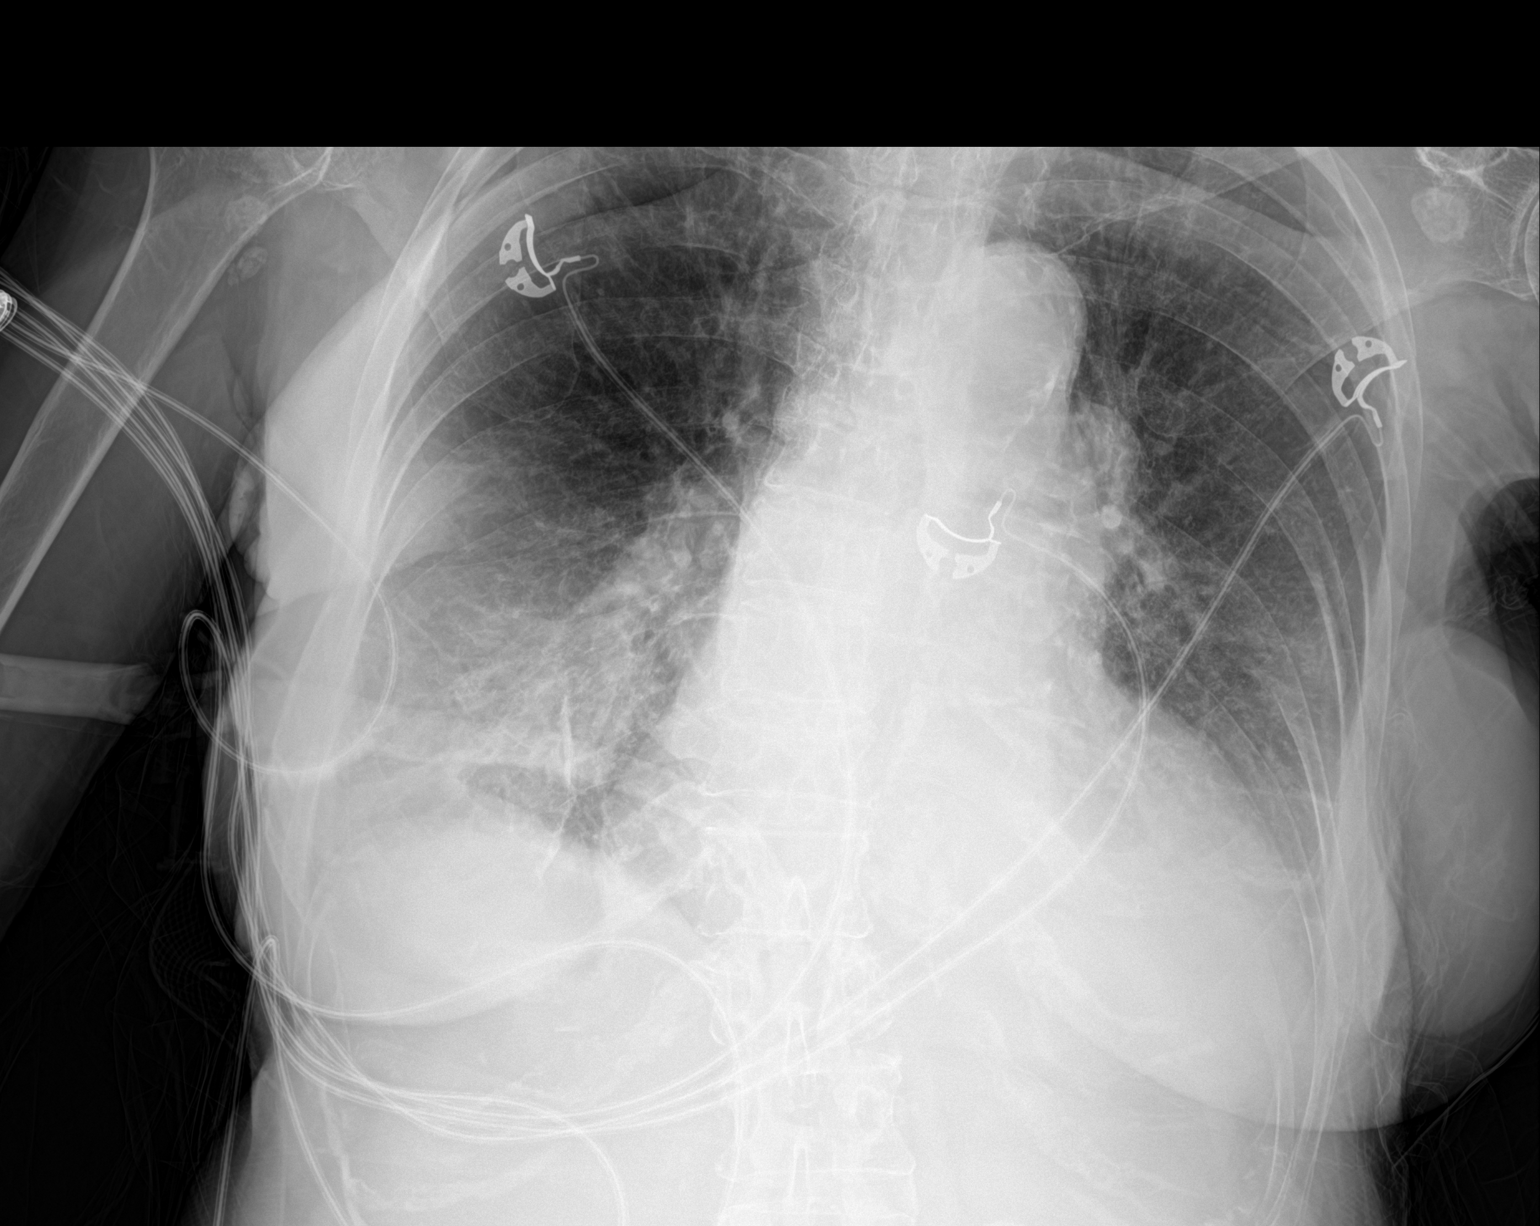

[1 of 1 positions shown; findings below may reference images not displayed]

FINDINGS: Single frontal view of the chest demonstrates stable enlargement of
the cardiac silhouette. The enteric catheter seen previously has
been removed in the interim. There are bibasilar veiling opacities
consistent with consolidation and small effusions, increased since
prior exam. No evidence of pneumothorax. No acute bony
abnormalities.
IMPRESSION: 1. Bibasilar veiling opacities, slightly increased since prior
study, consistent with underlying consolidation and/or effusions.

## 2022-10-14 DIAGNOSIS — R293 Abnormal posture: Secondary | ICD-10-CM | POA: Diagnosis not present

## 2022-10-14 DIAGNOSIS — R278 Other lack of coordination: Secondary | ICD-10-CM | POA: Diagnosis not present

## 2022-10-14 DIAGNOSIS — I69351 Hemiplegia and hemiparesis following cerebral infarction affecting right dominant side: Secondary | ICD-10-CM | POA: Diagnosis not present

## 2022-10-14 DIAGNOSIS — I63312 Cerebral infarction due to thrombosis of left middle cerebral artery: Secondary | ICD-10-CM | POA: Diagnosis not present

## 2022-10-14 DIAGNOSIS — I5032 Chronic diastolic (congestive) heart failure: Secondary | ICD-10-CM | POA: Diagnosis not present

## 2022-10-14 DIAGNOSIS — M6389 Disorders of muscle in diseases classified elsewhere, multiple sites: Secondary | ICD-10-CM | POA: Diagnosis not present

## 2022-10-15 ENCOUNTER — Non-Acute Institutional Stay (SKILLED_NURSING_FACILITY): Payer: Medicare Other | Admitting: Internal Medicine

## 2022-10-15 ENCOUNTER — Encounter: Payer: Self-pay | Admitting: Internal Medicine

## 2022-10-15 DIAGNOSIS — K219 Gastro-esophageal reflux disease without esophagitis: Secondary | ICD-10-CM

## 2022-10-15 DIAGNOSIS — I69351 Hemiplegia and hemiparesis following cerebral infarction affecting right dominant side: Secondary | ICD-10-CM | POA: Diagnosis not present

## 2022-10-15 DIAGNOSIS — J439 Emphysema, unspecified: Secondary | ICD-10-CM

## 2022-10-15 DIAGNOSIS — R278 Other lack of coordination: Secondary | ICD-10-CM | POA: Diagnosis not present

## 2022-10-15 DIAGNOSIS — R482 Apraxia: Secondary | ICD-10-CM | POA: Diagnosis not present

## 2022-10-15 DIAGNOSIS — G8191 Hemiplegia, unspecified affecting right dominant side: Secondary | ICD-10-CM

## 2022-10-15 DIAGNOSIS — I6602 Occlusion and stenosis of left middle cerebral artery: Secondary | ICD-10-CM | POA: Diagnosis not present

## 2022-10-15 DIAGNOSIS — R471 Dysarthria and anarthria: Secondary | ICD-10-CM | POA: Diagnosis not present

## 2022-10-15 DIAGNOSIS — I480 Paroxysmal atrial fibrillation: Secondary | ICD-10-CM | POA: Diagnosis not present

## 2022-10-15 DIAGNOSIS — I10 Essential (primary) hypertension: Secondary | ICD-10-CM | POA: Diagnosis not present

## 2022-10-15 DIAGNOSIS — E782 Mixed hyperlipidemia: Secondary | ICD-10-CM

## 2022-10-15 DIAGNOSIS — M6389 Disorders of muscle in diseases classified elsewhere, multiple sites: Secondary | ICD-10-CM | POA: Diagnosis not present

## 2022-10-15 DIAGNOSIS — I63312 Cerebral infarction due to thrombosis of left middle cerebral artery: Secondary | ICD-10-CM | POA: Diagnosis not present

## 2022-10-15 DIAGNOSIS — I5032 Chronic diastolic (congestive) heart failure: Secondary | ICD-10-CM | POA: Diagnosis not present

## 2022-10-15 DIAGNOSIS — F331 Major depressive disorder, recurrent, moderate: Secondary | ICD-10-CM

## 2022-10-15 NOTE — Progress Notes (Signed)
Location:  [facility] Room Number: 116A Place of Service:  SNF [phone]) Provider:  [name], MD   [name], MD  Patient Care Team: [name], MD as PCP - General (Internal Medicine) [name], MD as PCP - Cardiology (Cardiology) Duke Salvia, MD as PCP - Electrophysiology (Cardiology) Shirlean Kelly, MD as Consulting Physician (Neurosurgery)  Extended Emergency Contact Information Primary Emergency Contact: Henkin,[name] Address: [address]          [place] [phone] [name] Home Phone: [phone] Mobile Phone: [phone] Relation: Spouse Secondary Emergency Contact: Alkhatib,[name] Address: [address]          [place], [place] [address] [name] Mobile Phone: [phone] Relation: Son  Code Status:  Full Code Goals of care: Advanced Directive information    10/15/2022    9:53 AM  Advanced Directives  Does Patient Have a Medical Advance Directive? Yes  Type of Advance Directive Living will  Does patient want to make changes to medical advance directive? No - Patient declined     Chief Complaint  Patient presents with   Medical Management of Chronic Issues    Patient is being seen for a routine visit.   Immunizations    Discuss the need for shingles vaccine     HPI:  Pt is a 77 y.o. female seen today for medical management of chronic diseases.    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 was 77 yrs old, when under stress the left side of face will droop.   Complication of anesthesia    "vascular OR 2016; BP bottomed out; couldn't get it regulated; ended up in ICU for DAYS" (11/18/2017)   GERD (gastroesophageal reflux disease)    History of kidney stones    Hypertension    Hypertrophic cardiomyopathy (HCC)    severe LV basilar hypertrophy witn no evidence of significant outflow tract obstruction, EF 65-70%, mild LAE, mild TR, grade 1a diastolic dysfunction 05/15/10 (Dr. Donato Schultz) (Atrial Septal Hypertrophy pattern)-- Intra-op TEE with dsignificant outflow tract obstruction - AI, MR & TR   Insomnia    Mild aortic sclerosis    Osteopenia    Peripheral vascular disease (HCC)    Syncope    , Vagal   Past Surgical History:  Procedure Laterality Date   AUGMENTATION MAMMAPLASTY Bilateral    BACK SURGERY     CARDIAC CATHETERIZATION N/A 05/07/2016   Procedure: Left Heart Cath and Coronary Angiography;  Surgeon: Kathleene Hazel, MD;  Location: [facility] INVASIVE CV LAB;  Service: Cardiovascular;  Laterality: N/A;   CARDIOVERSION N/A 09/24/2017   Procedure: CARDIOVERSION;  Surgeon: Laurey Morale, MD;  Location: [facility] ENDOSCOPY;  Service: Cardiovascular;  Laterality: N/A;   DILATION AND CURETTAGE OF UTERUS     ENDARTERECTOMY FEMORAL Right 03/02/2015   Procedure: ENDARTERECTOMY RIGHT FEMORAL;  Surgeon: Chuck Hint, MD;  Location: [facility] OR;  Service: Vascular;  Laterality: Right;   ESOPHAGOGASTRODUODENOSCOPY (EGD) WITH PROPOFOL N/A 12/01/2020   Procedure: ESOPHAGOGASTRODUODENOSCOPY (EGD) WITH PROPOFOL;  Surgeon: Diamantina Monks, MD;  Location: [facility] ENDOSCOPY;  Service: General;  Laterality: N/A;   FACIAL COSMETIC SURGERY Left 2002   "related to Bell's  Palsy @ age 71; left eye/side of face droopy; tried to make area symmetrical"   FEMORAL-POPLITEAL BYPASS GRAFT Right 03/02/2015   Procedure: BYPASS GRAFT FEMORAL-BELOW KNEE POPLITEAL ARTERY;  Surgeon: Chuck Hint, MD;  Location: [facility] OR;  Service: Vascular;  Laterality: Right;   INGUINAL HERNIA REPAIR Bilateral 2002   IR ANGIO INTRA EXTRACRAN SEL COM CAROTID INNOMINATE UNI L MOD SED  11/16/2020   IR CT HEAD LTD  11/13/2020   IR PERCUTANEOUS ART THROMBECTOMY/INFUSION INTRACRANIAL INC DIAG ANGIO  11/13/2020   OVARIAN CYST REMOVAL Left    PEG PLACEMENT N/A 12/01/2020   Procedure: PERCUTANEOUS ENDOSCOPIC GASTROSTOMY (PEG) PLACEMENT;  Surgeon: Diamantina Monks, MD;  Location: [facility] ENDOSCOPY;  Service: General;  Laterality: N/A;   PERIPHERAL VASCULAR CATHETERIZATION N/A 01/16/2015   Procedure: Abdominal Aortogram;  Surgeon: Chuck Hint, MD;  Location: [facility] INVASIVE CV LAB;  Service: Cardiovascular;  Laterality: N/A;   POSTERIOR LUMBAR FUSION  2015   "have plates and screws in there"   RADIOLOGY WITH ANESTHESIA N/A 11/13/2020   Procedure: IR WITH ANESTHESIA;  Surgeon: Radiologist, Medication, MD;  Location: [facility] OR;  Service: Radiology;  Laterality: N/A;   TONSILLECTOMY      Allergies  Allergen Reactions   Amoxicillin Other (See Comments)    UTI Has patient had a PCN reaction causing immediate rash, facial/tongue/throat swelling, SOB or lightheadedness with hypotension: No Has patient had a PCN reaction causing severe rash involving mucus membranes or skin necrosis: No Has patient had a PCN reaction that required hospitalization: No Has patient had a PCN reaction occurring within the last 10 years: Yes--UTI ONLY If all of the above answers are "NO", then may proceed with Cephalosporin use.    Atenolol Cough   Crestor [Rosuvastatin Calcium] Other (See Comments)    Muscle aches   Pravastatin Other (See Comments)    Muscle aches   Rosuvastatin Calcium    Sulfa Antibiotics Nausea Only    Codeine Other (See Comments)    hallucinations    Outpatient Encounter Medications as of 10/15/2022  Medication Sig   acetaminophen (TYLENOL) 325 MG tablet Take 650 mg by mouth every 6 (six) hours as needed for mild pain or fever.   amiodarone (PACERONE) 200 MG tablet Take 200 mg by mouth daily. May give crushed   amLODipine (NORVASC) 5 MG tablet Take 5 mg by mouth daily.   apixaban (ELIQUIS) 5 MG TABS tablet Take 5 mg by mouth 2 (two) times daily. May give crushed   bisacodyl (DULCOLAX) 5 MG EC tablet Take 5 mg by mouth 2 (two) times daily as needed for moderate constipation.   buPROPion (WELLBUTRIN XL) 150 MG 24 hr tablet Take 150 mg by mouth daily.   diclofenac Sodium (VOLTAREN) 1 % GEL Apply one application to right hip and knee four times daily as needed.   Emollient (CERAVE) CREA Apply  1 Application topically as needed.   ezetimibe (ZETIA) 10 MG tablet Take 10 mg by mouth daily. Oral if crushed   furosemide (LASIX) 20 MG tablet Take 20 mg by mouth daily as needed. Give Lasix 20mg  for weight gain equal or greater than 3 pounds   hydrALAZINE (APRESOLINE) 10 MG tablet Take 10 mg by mouth 3 (three) times daily as needed (SBP > 165 or DBP > 95).   hydrocortisone cream 1 % Apply 1 Application topically 2 (two) times daily as needed for itching.   ipratropium-albuterol (DUONEB) 0.5-2.5 (3) MG/3ML SOLN Inhale 3 mLs into the lungs every 4 (four) hours as needed.   ketoconazole (NIZORAL) 2 % cream Apply 1 application  topically daily as needed for irritation.   nystatin cream (MYCOSTATIN) Apply 1 application topically 2 (two) times daily as needed for dry skin (apply to perirectal rash).   ondansetron (ZOFRAN) 4 MG tablet Take 4 mg by mouth every 6 (six) hours as needed for nausea or vomiting.   OXYGEN Inhale 3 L into the lungs as directed. to maintain sats > or equal to 90%. May titrate Three Times A Day; 07:00 AM - 03:00 PM, 03:00 PM - 11:00 PM, 11:00 PM - 7:00 am   pantoprazole (PROTONIX) 40 MG  tablet Take 40 mg by mouth 2 (two) times daily.   polyethylene glycol powder (GLYCOLAX/MIRALAX) 17 GM/SCOOP powder Take 17 g by mouth daily.   Tiotropium Bromide-Olodaterol (STIOLTO RESPIMAT) 2.5-2.5 MCG/ACT AERS Inhale 2 puffs into the lungs daily.   hydrocortisone cream 1 % Apply 1 Application topically 2 (two) times daily. (Patient not taking: Reported on 10/15/2022)   Facility-Administered Encounter Medications as of 10/15/2022  Medication   botulinum toxin Type A (BOTOX) injection 100 Units    Review of Systems  Constitutional:  Positive for appetite change. Negative for activity change.  HENT: Negative.    Respiratory:  Negative for cough and shortness of breath.   Cardiovascular:  Negative for leg swelling.  Gastrointestinal:  Positive for constipation.  Genitourinary: Negative.   Musculoskeletal:  Positive for gait problem. Negative for arthralgias and myalgias.  Skin: Negative.   Neurological:  Positive for weakness. Negative for dizziness.  Psychiatric/Behavioral:  Positive for dysphoric mood. Negative for confusion and sleep disturbance.     Immunization History  Administered Date(s) Administered   Fluad Quad(high Dose 65+) 02/05/2022   Influenza Split 01/16/2009, 04/30/2010, 01/26/2016, 02/07/2018, 02/01/2020   Influenza, High Dose Seasonal PF 02/07/2018, 01/25/2019, 02/28/2021, 02/05/2022   Influenza,inj,Quad PF,6+ Mos 03/06/2012, 01/26/2016, 01/10/2017   Influenza,inj,quad, With Preservative 02/14/2015   Moderna Covid-19 Vaccine Bivalent Booster 56yrs & up 02/28/2021   PFIZER(Purple Top)SARS-COV-2 Vaccination 05/06/2019, 05/27/2019, 01/27/2020, 09/18/2020   Pneumococcal Conjugate-13 03/19/2017   Pneumococcal Polysaccharide-23 02/14/2015   Respiratory Syncytial Virus Vaccine,Recomb Aduvanted(Arexvy) 02/15/2022   Td 02/25/2005   Tdap 05/19/2013   Zoster, Live 07/28/2006   Pertinent  Health Maintenance Due  Topic Date Due   INFLUENZA VACCINE  11/14/2022   DEXA SCAN   Discontinued      07/08/2021    9:00 AM 07/08/2021    9:30 PM 07/09/2021    8:00 AM 05/07/2022   11:59 AM 08/13/2022   11:02 AM  Fall Risk  Falls in the past year?    0 1  Was there an injury with Fall?    0 0  Fall Risk Category Calculator    0 1  (RETIRED) Patient Fall Risk Level High fall risk High fall risk Moderate fall risk  Patient at Risk for Falls Due to    No Fall Risks History of fall(s);Impaired balance/gait;Impaired mobility  Patient at Risk for Falls Due to - Comments     right sided hemiparesis  Fall risk Follow up    Falls evaluation completed Falls evaluation completed;Education provided;Falls prevention discussed   Functional Status Survey:    Vitals:   10/15/22 0940  BP: 134/78  Pulse: (!) 55  Resp: 18  Temp: 97.8 F (36.6 C)  TempSrc: Temporal  SpO2: 94%  Weight: 93 lb 14.4 oz (42.6 kg)  Height: 5\' 3"  (1.6 m)   Body mass index is 16.63 kg/m. Physical Exam Vitals reviewed.  Constitutional:      Appearance: Normal appearance.  HENT:     Head: Normocephalic.     Nose: Nose normal.     Mouth/Throat:     Mouth: Mucous membranes are moist.     Pharynx: Oropharynx is clear.  Eyes:     Pupils: Pupils are equal, round, and reactive to light.  Cardiovascular:     Rate and Rhythm: Normal rate and regular rhythm.     Pulses: Normal pulses.     Heart sounds: Normal heart sounds. No murmur heard. Pulmonary:     Effort: Pulmonary effort is normal.     Breath sounds: Normal breath sounds.  Abdominal:     General: Abdomen is flat. Bowel sounds are normal.     Palpations: Abdomen is soft.  Musculoskeletal:        General: No swelling.     Cervical back: Neck supple.  Skin:    General: Skin is warm.  Neurological:     Mental Status: She is alert.     Comments: Right Hemiparesis  Expressive aphasia  Psychiatric:        Mood and Affect: Mood normal.        Thought Content: Thought content normal.     Labs reviewed: Recent Labs    12/03/21 0000  03/18/22 0500  NA 139 140  K 4.4 4.3  CL 104 106  CO2 27* 26*  BUN 28* 31*  CREATININE 0.9 0.9  CALCIUM 9.0 8.9   Recent Labs    12/03/21 0000 12/10/21 0000 03/18/22 0500  AST 38* 32 33  ALT 48* 38* 39*  ALKPHOS 122 122 89  ALBUMIN 3.4* 3.5 3.4*   Recent Labs    03/18/22 0500 09/19/22 0000  WBC 5.9 7.4  HGB 13.3 14.0  HCT 41 43  PLT 224 246   Lab Results  Component Value Date   TSH 0.61 08/08/2022   Lab Results  Component Value Date   HGBA1C 6.0 (H) 11/13/2020   Lab Results  Component Value Date   CHOL 182 11/22/2021   HDL 80 (A) 11/22/2021   LDLCALC 89 11/22/2021   TRIG 64 11/22/2021   CHOLHDL 3.0 11/13/2020    Significant Diagnostic Results in last 30 days:  No results found.  Assessment/Plan 1. PAF (paroxysmal atrial fibrillation) (HCC) Amiodarone and Eliquis  2. Right hemiplegia (HCC) Works with therapy Botox with Neurology  3. Pulmonary emphysema, unspecified emphysema type (HCC) On chronic Oxygen and on Stolto  4. Essential hypertension Norvasc   5. Mixed hyperlipidemia Zetia  6. Moderate episode of recurrent major depressive disorder (HCC) Wellbutrin Consider Remeron if continues to decline with weight Patient denied Depression  7. Gastroesophageal reflux disease without esophagitis Protonix  8. Chronic diastolic congestive heart failure (HCC) On Lasix PRn 9 Constipation Will add senna  Family/ staff  Communication:   Labs/tests ordered:

## 2022-10-16 DIAGNOSIS — I63312 Cerebral infarction due to thrombosis of left middle cerebral artery: Secondary | ICD-10-CM | POA: Diagnosis not present

## 2022-10-16 DIAGNOSIS — I5032 Chronic diastolic (congestive) heart failure: Secondary | ICD-10-CM | POA: Diagnosis not present

## 2022-10-16 DIAGNOSIS — R278 Other lack of coordination: Secondary | ICD-10-CM | POA: Diagnosis not present

## 2022-10-16 DIAGNOSIS — M6389 Disorders of muscle in diseases classified elsewhere, multiple sites: Secondary | ICD-10-CM | POA: Diagnosis not present

## 2022-10-16 DIAGNOSIS — I69351 Hemiplegia and hemiparesis following cerebral infarction affecting right dominant side: Secondary | ICD-10-CM | POA: Diagnosis not present

## 2022-10-16 IMAGING — CT CT CHEST W/ CM
2 of 4 series · 15 of 36 positions shown, 18 images · IV contrast (APPLIED)
Comparison: None.

CLINICAL DATA: Acute hypoxic respiratory failure, abnormal chest
x-ray.

EXAM:
CT CHEST WITH CONTRAST
TECHNIQUE: Multidetector CT imaging of the chest was performed during
intravenous contrast administration.
CONTRAST:  50mL OMNIPAQUE IOHEXOL 350 MG/ML SOLN

[Series 3: chest w · axial · 0.63mm/px · z∈[+1292,+1534]mm · 12 of 143 slices shown, 15 images]
[im 11/143  mediastinal]
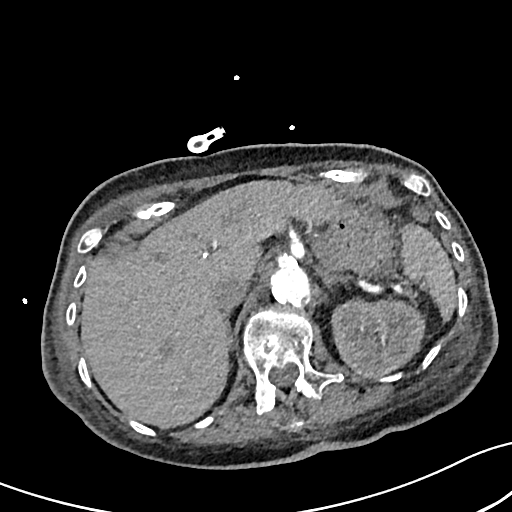
[im 11/143  lung]
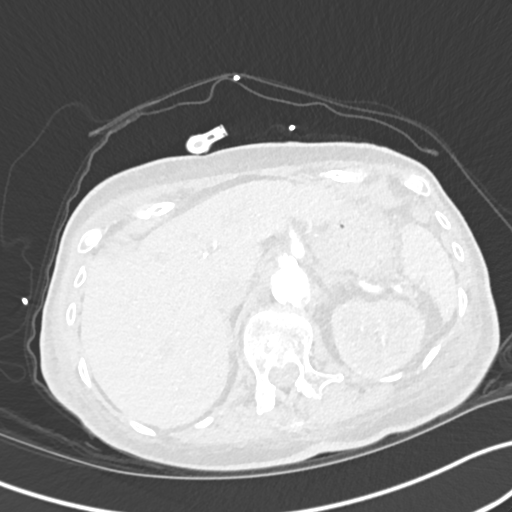
[im 22/143  lung]
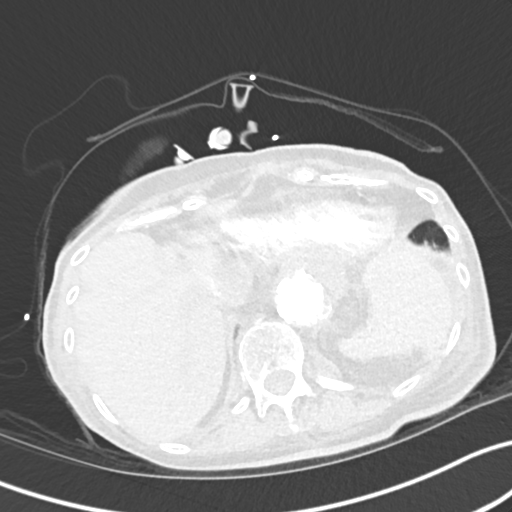
[im 33/143  lung]
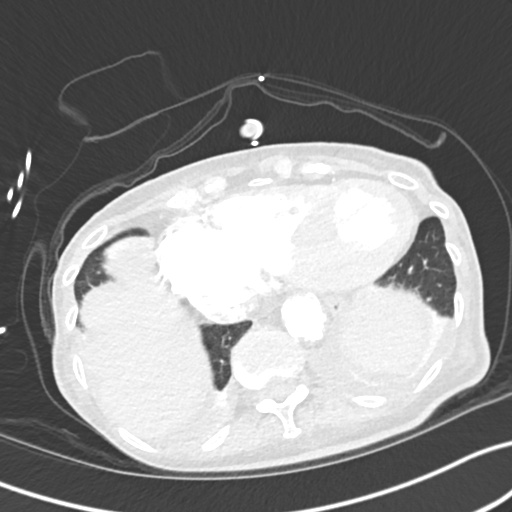
[im 44/143  lung]
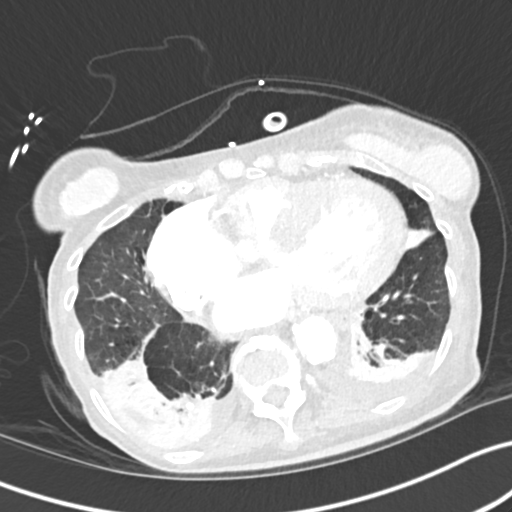
[im 55/143  mediastinal]
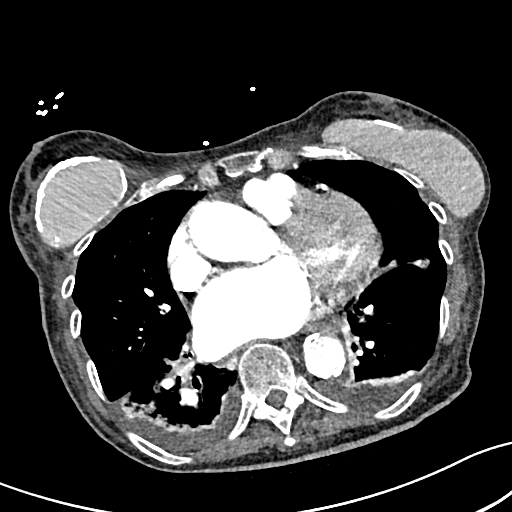
[im 55/143  lung]
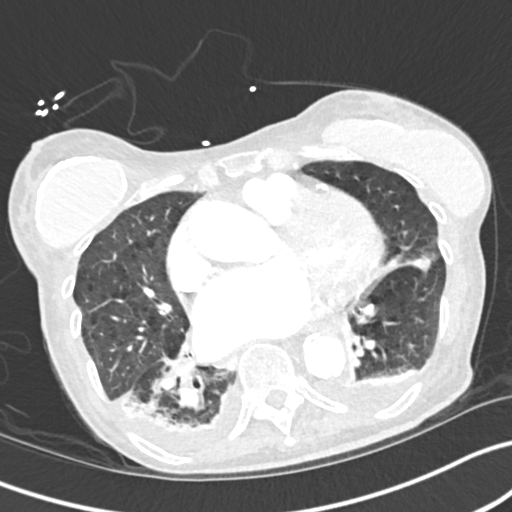
[im 66/143  lung]
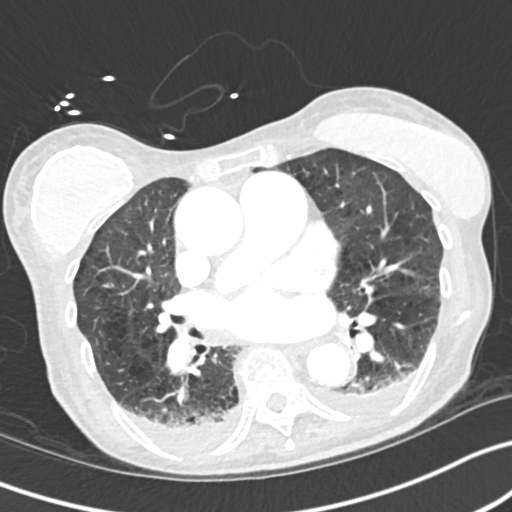
[im 77/143  lung]
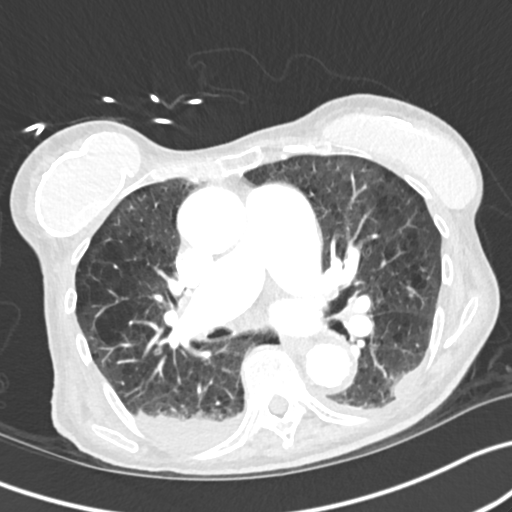
[im 88/143  lung]
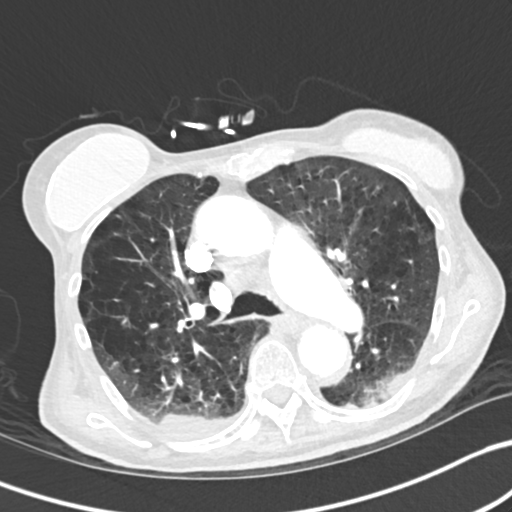
[im 99/143  mediastinal]
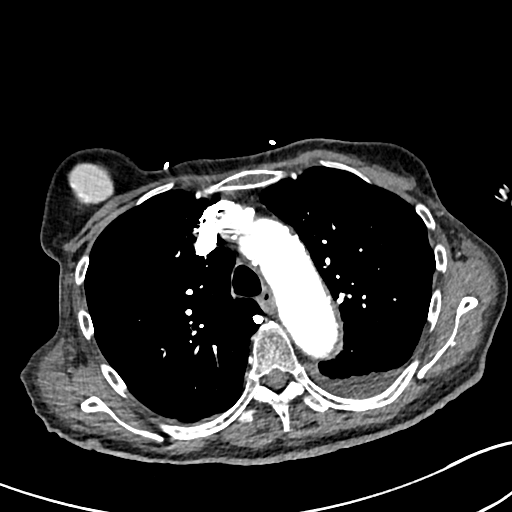
[im 99/143  lung]
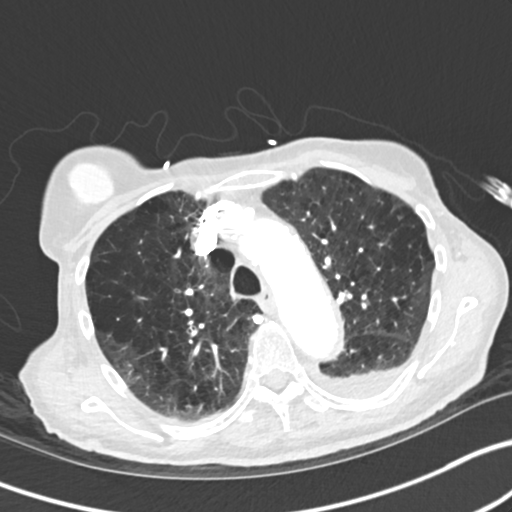
[im 110/143  lung]
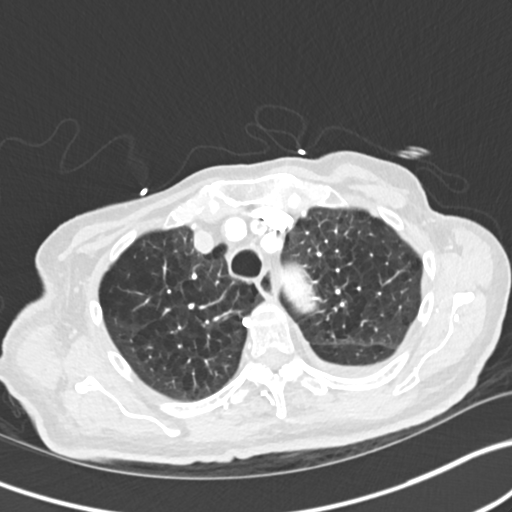
[im 121/143  lung]
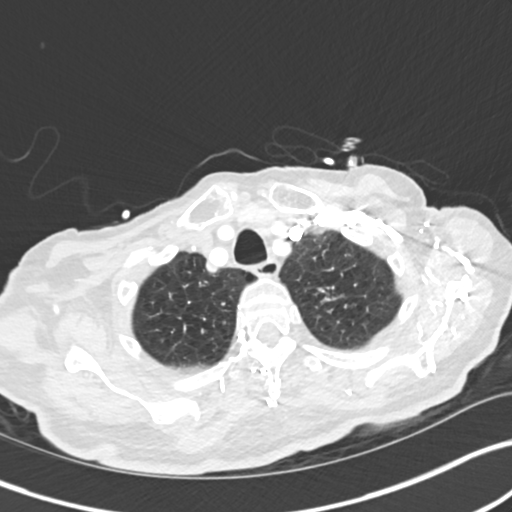
[im 132/143  lung]
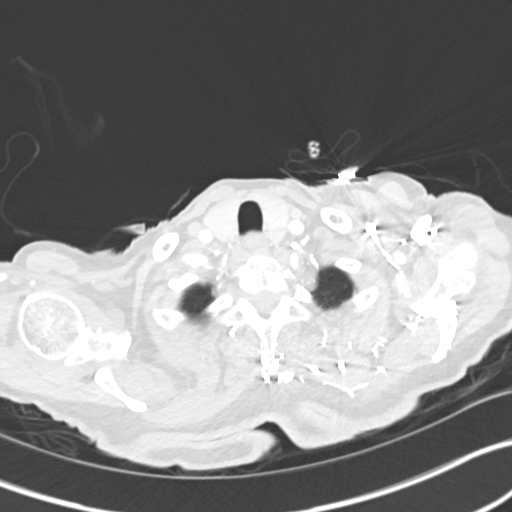

[Series 6: cor · coronal · 0.62mm/px · 3 of 120 slices shown]
[im 24/120  lung]
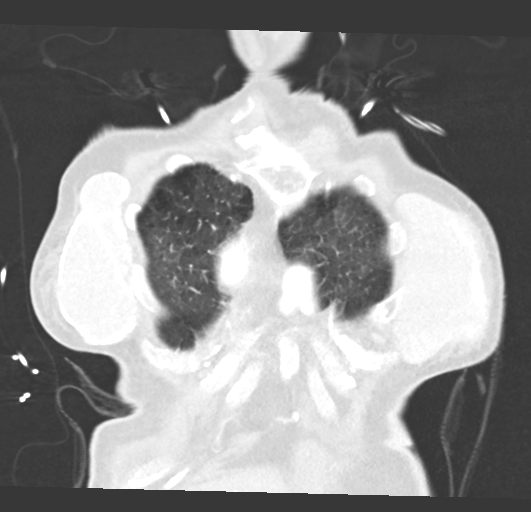
[im 48/120  lung]
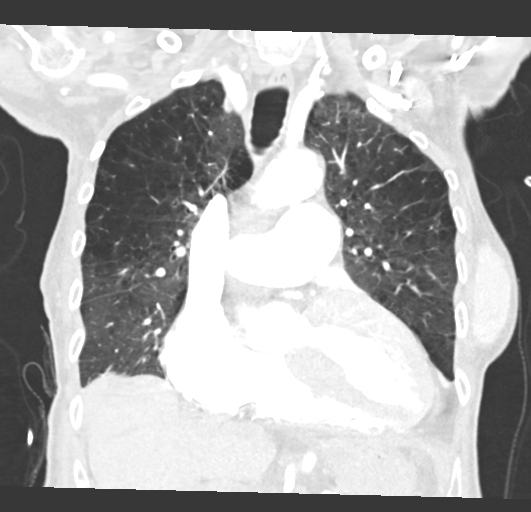
[im 72/120  lung]
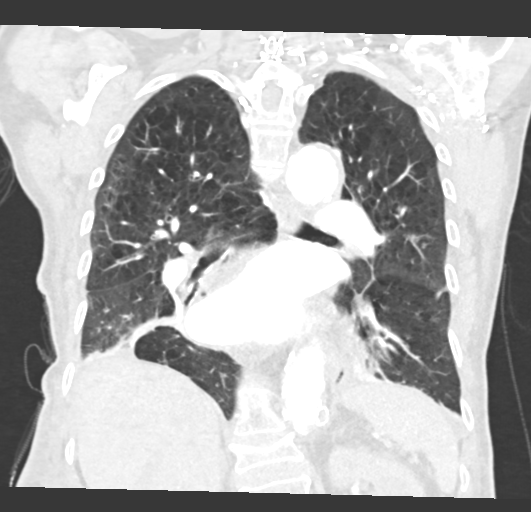

[15 of 36 positions shown; findings below may reference images not displayed]

FINDINGS: Cardiovascular: There is extensive multi-vessel coronary artery
calcification. There is mild to moderate cardiomegaly with left
ventricular hypertrophy. Hypertrophy of the left ventricle
asymmetrically involving the ventricular septum results in marked
narrowing of the left ventricular outflow tract. This is best
appreciated on axial image # 96/3 and sagittal image # 100/7. No
pericardial effusion. There is marked enlargement of the a central
pulmonary arteries in keeping with changes of pulmonary arterial
hypertension. There is adequate opacification of the pulmonary
arterial tree and no intraluminal filling defect is identified to
suggest acute pulmonary embolism. Moderate atherosclerotic
calcification noted within the thoracic aorta. The thoracic aorta is
dilated measuring 4.3 cm in diameter in its ascending segment,
cm in diameter at the arch, and 2.5 cm in diameter at the level of
the left atrium.

Mediastinum/Nodes: The visualized thyroid is unremarkable. No
pathologic thoracic adenopathy. The esophagus is unremarkable.

Lungs/Pleura: Severe emphysema. Small bilateral pleural effusions
have developed with bibasilar compressive atelectasis. No
superimposed focal pulmonary infiltrate. No pneumothorax. Central
airways are widely patent.

Upper Abdomen: No acute abnormality.

Musculoskeletal: No acute bone abnormality. No lytic or blastic bone
lesion identified.
IMPRESSION: Severe emphysema.

Small bilateral pleural effusions with bibasilar compressive
atelectasis.

Extensive multi-vessel coronary artery calcification.

Cardiomegaly with asymmetric septal hypertrophy resulting in
impingement of the left ventricular outflow tract. Correlation with
echocardiography may be helpful for further evaluation.

Morphologic changes in keeping with pulmonary arterial hypertension.

Dilation of the a thoracic aorta measuring 4.3 cm in greatest
dimension within the ascending segment. Recommend annual imaging
followup by CTA or MRA. This recommendation follows 3191
ACCF/AHA/AATS/ACR/ASA/SCA/AZZ/MELUR/YORDANOS/RANJANI Guidelines for the
Diagnosis and Management of Patients with Thoracic Aortic Disease.
Circulation. 3191; 121: [id number]. Aortic aneurysm NOS (AR6BH-FUB.D)

Aortic Atherosclerosis (AR6BH-8VA.A) and Emphysema (AR6BH-XOU.S).
Aortic aneurysm NOS (AR6BH-FUB.D).

## 2022-10-17 DIAGNOSIS — I63312 Cerebral infarction due to thrombosis of left middle cerebral artery: Secondary | ICD-10-CM | POA: Diagnosis not present

## 2022-10-17 DIAGNOSIS — R278 Other lack of coordination: Secondary | ICD-10-CM | POA: Diagnosis not present

## 2022-10-17 DIAGNOSIS — I5032 Chronic diastolic (congestive) heart failure: Secondary | ICD-10-CM | POA: Diagnosis not present

## 2022-10-17 DIAGNOSIS — I69351 Hemiplegia and hemiparesis following cerebral infarction affecting right dominant side: Secondary | ICD-10-CM | POA: Diagnosis not present

## 2022-10-17 DIAGNOSIS — J9611 Chronic respiratory failure with hypoxia: Secondary | ICD-10-CM | POA: Diagnosis not present

## 2022-10-21 DIAGNOSIS — I63312 Cerebral infarction due to thrombosis of left middle cerebral artery: Secondary | ICD-10-CM | POA: Diagnosis not present

## 2022-10-21 DIAGNOSIS — I5032 Chronic diastolic (congestive) heart failure: Secondary | ICD-10-CM | POA: Diagnosis not present

## 2022-10-21 DIAGNOSIS — M6389 Disorders of muscle in diseases classified elsewhere, multiple sites: Secondary | ICD-10-CM | POA: Diagnosis not present

## 2022-10-21 DIAGNOSIS — R278 Other lack of coordination: Secondary | ICD-10-CM | POA: Diagnosis not present

## 2022-10-21 DIAGNOSIS — I69351 Hemiplegia and hemiparesis following cerebral infarction affecting right dominant side: Secondary | ICD-10-CM | POA: Diagnosis not present

## 2022-10-22 DIAGNOSIS — I69351 Hemiplegia and hemiparesis following cerebral infarction affecting right dominant side: Secondary | ICD-10-CM | POA: Diagnosis not present

## 2022-10-22 DIAGNOSIS — I5032 Chronic diastolic (congestive) heart failure: Secondary | ICD-10-CM | POA: Diagnosis not present

## 2022-10-22 DIAGNOSIS — M6389 Disorders of muscle in diseases classified elsewhere, multiple sites: Secondary | ICD-10-CM | POA: Diagnosis not present

## 2022-10-22 DIAGNOSIS — I63312 Cerebral infarction due to thrombosis of left middle cerebral artery: Secondary | ICD-10-CM | POA: Diagnosis not present

## 2022-10-22 DIAGNOSIS — R278 Other lack of coordination: Secondary | ICD-10-CM | POA: Diagnosis not present

## 2022-10-23 DIAGNOSIS — I63312 Cerebral infarction due to thrombosis of left middle cerebral artery: Secondary | ICD-10-CM | POA: Diagnosis not present

## 2022-10-23 DIAGNOSIS — I69351 Hemiplegia and hemiparesis following cerebral infarction affecting right dominant side: Secondary | ICD-10-CM | POA: Diagnosis not present

## 2022-10-23 DIAGNOSIS — I5032 Chronic diastolic (congestive) heart failure: Secondary | ICD-10-CM | POA: Diagnosis not present

## 2022-10-23 DIAGNOSIS — R278 Other lack of coordination: Secondary | ICD-10-CM | POA: Diagnosis not present

## 2022-10-23 DIAGNOSIS — M6389 Disorders of muscle in diseases classified elsewhere, multiple sites: Secondary | ICD-10-CM | POA: Diagnosis not present

## 2022-10-24 DIAGNOSIS — R278 Other lack of coordination: Secondary | ICD-10-CM | POA: Diagnosis not present

## 2022-10-24 DIAGNOSIS — J9611 Chronic respiratory failure with hypoxia: Secondary | ICD-10-CM | POA: Diagnosis not present

## 2022-10-24 DIAGNOSIS — I69351 Hemiplegia and hemiparesis following cerebral infarction affecting right dominant side: Secondary | ICD-10-CM | POA: Diagnosis not present

## 2022-10-24 DIAGNOSIS — I5032 Chronic diastolic (congestive) heart failure: Secondary | ICD-10-CM | POA: Diagnosis not present

## 2022-10-24 DIAGNOSIS — I63312 Cerebral infarction due to thrombosis of left middle cerebral artery: Secondary | ICD-10-CM | POA: Diagnosis not present

## 2022-10-28 DIAGNOSIS — R278 Other lack of coordination: Secondary | ICD-10-CM | POA: Diagnosis not present

## 2022-10-28 DIAGNOSIS — I69351 Hemiplegia and hemiparesis following cerebral infarction affecting right dominant side: Secondary | ICD-10-CM | POA: Diagnosis not present

## 2022-10-28 DIAGNOSIS — I63312 Cerebral infarction due to thrombosis of left middle cerebral artery: Secondary | ICD-10-CM | POA: Diagnosis not present

## 2022-10-28 DIAGNOSIS — I5032 Chronic diastolic (congestive) heart failure: Secondary | ICD-10-CM | POA: Diagnosis not present

## 2022-10-28 DIAGNOSIS — M6389 Disorders of muscle in diseases classified elsewhere, multiple sites: Secondary | ICD-10-CM | POA: Diagnosis not present

## 2022-10-29 DIAGNOSIS — R278 Other lack of coordination: Secondary | ICD-10-CM | POA: Diagnosis not present

## 2022-10-29 DIAGNOSIS — I63312 Cerebral infarction due to thrombosis of left middle cerebral artery: Secondary | ICD-10-CM | POA: Diagnosis not present

## 2022-10-29 DIAGNOSIS — M6389 Disorders of muscle in diseases classified elsewhere, multiple sites: Secondary | ICD-10-CM | POA: Diagnosis not present

## 2022-10-29 DIAGNOSIS — I69351 Hemiplegia and hemiparesis following cerebral infarction affecting right dominant side: Secondary | ICD-10-CM | POA: Diagnosis not present

## 2022-10-29 DIAGNOSIS — I5032 Chronic diastolic (congestive) heart failure: Secondary | ICD-10-CM | POA: Diagnosis not present

## 2022-10-30 DIAGNOSIS — I69351 Hemiplegia and hemiparesis following cerebral infarction affecting right dominant side: Secondary | ICD-10-CM | POA: Diagnosis not present

## 2022-10-30 DIAGNOSIS — I63312 Cerebral infarction due to thrombosis of left middle cerebral artery: Secondary | ICD-10-CM | POA: Diagnosis not present

## 2022-10-30 DIAGNOSIS — R278 Other lack of coordination: Secondary | ICD-10-CM | POA: Diagnosis not present

## 2022-10-30 DIAGNOSIS — I5032 Chronic diastolic (congestive) heart failure: Secondary | ICD-10-CM | POA: Diagnosis not present

## 2022-10-30 DIAGNOSIS — M6389 Disorders of muscle in diseases classified elsewhere, multiple sites: Secondary | ICD-10-CM | POA: Diagnosis not present

## 2022-10-31 DIAGNOSIS — I63312 Cerebral infarction due to thrombosis of left middle cerebral artery: Secondary | ICD-10-CM | POA: Diagnosis not present

## 2022-10-31 DIAGNOSIS — I5032 Chronic diastolic (congestive) heart failure: Secondary | ICD-10-CM | POA: Diagnosis not present

## 2022-10-31 DIAGNOSIS — R278 Other lack of coordination: Secondary | ICD-10-CM | POA: Diagnosis not present

## 2022-10-31 DIAGNOSIS — I69351 Hemiplegia and hemiparesis following cerebral infarction affecting right dominant side: Secondary | ICD-10-CM | POA: Diagnosis not present

## 2022-10-31 DIAGNOSIS — J9611 Chronic respiratory failure with hypoxia: Secondary | ICD-10-CM | POA: Diagnosis not present

## 2022-11-01 DIAGNOSIS — I69351 Hemiplegia and hemiparesis following cerebral infarction affecting right dominant side: Secondary | ICD-10-CM | POA: Diagnosis not present

## 2022-11-01 DIAGNOSIS — R278 Other lack of coordination: Secondary | ICD-10-CM | POA: Diagnosis not present

## 2022-11-01 DIAGNOSIS — I63312 Cerebral infarction due to thrombosis of left middle cerebral artery: Secondary | ICD-10-CM | POA: Diagnosis not present

## 2022-11-01 DIAGNOSIS — I5032 Chronic diastolic (congestive) heart failure: Secondary | ICD-10-CM | POA: Diagnosis not present

## 2022-11-01 DIAGNOSIS — M6389 Disorders of muscle in diseases classified elsewhere, multiple sites: Secondary | ICD-10-CM | POA: Diagnosis not present

## 2022-11-04 DIAGNOSIS — I69351 Hemiplegia and hemiparesis following cerebral infarction affecting right dominant side: Secondary | ICD-10-CM | POA: Diagnosis not present

## 2022-11-04 DIAGNOSIS — R278 Other lack of coordination: Secondary | ICD-10-CM | POA: Diagnosis not present

## 2022-11-04 DIAGNOSIS — I5032 Chronic diastolic (congestive) heart failure: Secondary | ICD-10-CM | POA: Diagnosis not present

## 2022-11-04 DIAGNOSIS — I63312 Cerebral infarction due to thrombosis of left middle cerebral artery: Secondary | ICD-10-CM | POA: Diagnosis not present

## 2022-11-04 DIAGNOSIS — M6389 Disorders of muscle in diseases classified elsewhere, multiple sites: Secondary | ICD-10-CM | POA: Diagnosis not present

## 2022-11-08 DIAGNOSIS — M6389 Disorders of muscle in diseases classified elsewhere, multiple sites: Secondary | ICD-10-CM | POA: Diagnosis not present

## 2022-11-08 DIAGNOSIS — I63312 Cerebral infarction due to thrombosis of left middle cerebral artery: Secondary | ICD-10-CM | POA: Diagnosis not present

## 2022-11-08 DIAGNOSIS — I69351 Hemiplegia and hemiparesis following cerebral infarction affecting right dominant side: Secondary | ICD-10-CM | POA: Diagnosis not present

## 2022-11-08 DIAGNOSIS — R278 Other lack of coordination: Secondary | ICD-10-CM | POA: Diagnosis not present

## 2022-11-08 DIAGNOSIS — I5032 Chronic diastolic (congestive) heart failure: Secondary | ICD-10-CM | POA: Diagnosis not present

## 2022-11-11 ENCOUNTER — Telehealth: Payer: Self-pay | Admitting: Cardiology

## 2022-11-11 DIAGNOSIS — R278 Other lack of coordination: Secondary | ICD-10-CM | POA: Diagnosis not present

## 2022-11-11 DIAGNOSIS — I69351 Hemiplegia and hemiparesis following cerebral infarction affecting right dominant side: Secondary | ICD-10-CM | POA: Diagnosis not present

## 2022-11-11 DIAGNOSIS — M6389 Disorders of muscle in diseases classified elsewhere, multiple sites: Secondary | ICD-10-CM | POA: Diagnosis not present

## 2022-11-11 DIAGNOSIS — I5032 Chronic diastolic (congestive) heart failure: Secondary | ICD-10-CM | POA: Diagnosis not present

## 2022-11-11 DIAGNOSIS — M8588 Other specified disorders of bone density and structure, other site: Secondary | ICD-10-CM | POA: Diagnosis not present

## 2022-11-11 DIAGNOSIS — Z1231 Encounter for screening mammogram for malignant neoplasm of breast: Secondary | ICD-10-CM | POA: Diagnosis not present

## 2022-11-11 DIAGNOSIS — I63312 Cerebral infarction due to thrombosis of left middle cerebral artery: Secondary | ICD-10-CM | POA: Diagnosis not present

## 2022-11-11 LAB — HM MAMMOGRAPHY

## 2022-11-11 LAB — HM DEXA SCAN

## 2022-11-11 NOTE — Telephone Encounter (Signed)
STAT if patient feels like he/she is going to faint   Are you dizzy now?  Misty with [facility] states the patient was dizzy on Friday  Do you feel faint or have you passed out?  No   Do you have any other symptoms?  No   Have you checked your HR and BP (record if available)?  7/26: 108/58 52

## 2022-11-11 NOTE — Telephone Encounter (Signed)
Attempted to return the call to [facility].  No answer,  left message to call back to Dr Anne Fu' office to discuss her concerns. Will need current VSs and review medication list.

## 2022-11-12 DIAGNOSIS — I63312 Cerebral infarction due to thrombosis of left middle cerebral artery: Secondary | ICD-10-CM | POA: Diagnosis not present

## 2022-11-12 DIAGNOSIS — I5032 Chronic diastolic (congestive) heart failure: Secondary | ICD-10-CM | POA: Diagnosis not present

## 2022-11-12 DIAGNOSIS — M6389 Disorders of muscle in diseases classified elsewhere, multiple sites: Secondary | ICD-10-CM | POA: Diagnosis not present

## 2022-11-12 DIAGNOSIS — I69351 Hemiplegia and hemiparesis following cerebral infarction affecting right dominant side: Secondary | ICD-10-CM | POA: Diagnosis not present

## 2022-11-12 DIAGNOSIS — R278 Other lack of coordination: Secondary | ICD-10-CM | POA: Diagnosis not present

## 2022-11-12 NOTE — Telephone Encounter (Signed)
Spoke with [facility] who reports pt had had a tooth extracted and had not been eating/drinking well.  On Friday when she sat up on the side of the bed, she had dizziness.  She was instructed to increase fluid intake and make sure she is eating enough.  Pt has had no more episodes since Friday.  [facility] will continue to monitor and call back if any further problems.

## 2022-11-13 DIAGNOSIS — M6389 Disorders of muscle in diseases classified elsewhere, multiple sites: Secondary | ICD-10-CM | POA: Diagnosis not present

## 2022-11-13 DIAGNOSIS — R278 Other lack of coordination: Secondary | ICD-10-CM | POA: Diagnosis not present

## 2022-11-13 DIAGNOSIS — I5032 Chronic diastolic (congestive) heart failure: Secondary | ICD-10-CM | POA: Diagnosis not present

## 2022-11-13 DIAGNOSIS — I69351 Hemiplegia and hemiparesis following cerebral infarction affecting right dominant side: Secondary | ICD-10-CM | POA: Diagnosis not present

## 2022-11-13 DIAGNOSIS — I63312 Cerebral infarction due to thrombosis of left middle cerebral artery: Secondary | ICD-10-CM | POA: Diagnosis not present

## 2022-11-15 DIAGNOSIS — R293 Abnormal posture: Secondary | ICD-10-CM | POA: Diagnosis not present

## 2022-11-15 DIAGNOSIS — I69351 Hemiplegia and hemiparesis following cerebral infarction affecting right dominant side: Secondary | ICD-10-CM | POA: Diagnosis not present

## 2022-11-15 DIAGNOSIS — I5032 Chronic diastolic (congestive) heart failure: Secondary | ICD-10-CM | POA: Diagnosis not present

## 2022-11-15 DIAGNOSIS — M6389 Disorders of muscle in diseases classified elsewhere, multiple sites: Secondary | ICD-10-CM | POA: Diagnosis not present

## 2022-11-15 DIAGNOSIS — R278 Other lack of coordination: Secondary | ICD-10-CM | POA: Diagnosis not present

## 2022-11-15 DIAGNOSIS — I63312 Cerebral infarction due to thrombosis of left middle cerebral artery: Secondary | ICD-10-CM | POA: Diagnosis not present

## 2022-11-19 DIAGNOSIS — I5032 Chronic diastolic (congestive) heart failure: Secondary | ICD-10-CM | POA: Diagnosis not present

## 2022-11-19 DIAGNOSIS — I69351 Hemiplegia and hemiparesis following cerebral infarction affecting right dominant side: Secondary | ICD-10-CM | POA: Diagnosis not present

## 2022-11-19 DIAGNOSIS — R278 Other lack of coordination: Secondary | ICD-10-CM | POA: Diagnosis not present

## 2022-11-19 DIAGNOSIS — M6389 Disorders of muscle in diseases classified elsewhere, multiple sites: Secondary | ICD-10-CM | POA: Diagnosis not present

## 2022-11-19 DIAGNOSIS — I63312 Cerebral infarction due to thrombosis of left middle cerebral artery: Secondary | ICD-10-CM | POA: Diagnosis not present

## 2022-11-20 DIAGNOSIS — I69351 Hemiplegia and hemiparesis following cerebral infarction affecting right dominant side: Secondary | ICD-10-CM | POA: Diagnosis not present

## 2022-11-20 DIAGNOSIS — M6389 Disorders of muscle in diseases classified elsewhere, multiple sites: Secondary | ICD-10-CM | POA: Diagnosis not present

## 2022-11-20 DIAGNOSIS — I63312 Cerebral infarction due to thrombosis of left middle cerebral artery: Secondary | ICD-10-CM | POA: Diagnosis not present

## 2022-11-20 DIAGNOSIS — R278 Other lack of coordination: Secondary | ICD-10-CM | POA: Diagnosis not present

## 2022-11-20 DIAGNOSIS — I5032 Chronic diastolic (congestive) heart failure: Secondary | ICD-10-CM | POA: Diagnosis not present

## 2022-11-21 DIAGNOSIS — I63312 Cerebral infarction due to thrombosis of left middle cerebral artery: Secondary | ICD-10-CM | POA: Diagnosis not present

## 2022-11-21 DIAGNOSIS — J9611 Chronic respiratory failure with hypoxia: Secondary | ICD-10-CM | POA: Diagnosis not present

## 2022-11-21 DIAGNOSIS — M6389 Disorders of muscle in diseases classified elsewhere, multiple sites: Secondary | ICD-10-CM | POA: Diagnosis not present

## 2022-11-21 DIAGNOSIS — I5032 Chronic diastolic (congestive) heart failure: Secondary | ICD-10-CM | POA: Diagnosis not present

## 2022-11-21 DIAGNOSIS — I69351 Hemiplegia and hemiparesis following cerebral infarction affecting right dominant side: Secondary | ICD-10-CM | POA: Diagnosis not present

## 2022-11-21 DIAGNOSIS — R278 Other lack of coordination: Secondary | ICD-10-CM | POA: Diagnosis not present

## 2022-11-22 DIAGNOSIS — R278 Other lack of coordination: Secondary | ICD-10-CM | POA: Diagnosis not present

## 2022-11-22 DIAGNOSIS — I5032 Chronic diastolic (congestive) heart failure: Secondary | ICD-10-CM | POA: Diagnosis not present

## 2022-11-22 DIAGNOSIS — I63312 Cerebral infarction due to thrombosis of left middle cerebral artery: Secondary | ICD-10-CM | POA: Diagnosis not present

## 2022-11-22 DIAGNOSIS — I69351 Hemiplegia and hemiparesis following cerebral infarction affecting right dominant side: Secondary | ICD-10-CM | POA: Diagnosis not present

## 2022-11-22 DIAGNOSIS — M6389 Disorders of muscle in diseases classified elsewhere, multiple sites: Secondary | ICD-10-CM | POA: Diagnosis not present

## 2022-11-26 ENCOUNTER — Non-Acute Institutional Stay (SKILLED_NURSING_FACILITY): Payer: Medicare Other | Admitting: Orthopedic Surgery

## 2022-11-26 ENCOUNTER — Encounter: Payer: Self-pay | Admitting: Orthopedic Surgery

## 2022-11-26 DIAGNOSIS — F331 Major depressive disorder, recurrent, moderate: Secondary | ICD-10-CM

## 2022-11-26 DIAGNOSIS — E782 Mixed hyperlipidemia: Secondary | ICD-10-CM

## 2022-11-26 DIAGNOSIS — J439 Emphysema, unspecified: Secondary | ICD-10-CM | POA: Diagnosis not present

## 2022-11-26 DIAGNOSIS — R42 Dizziness and giddiness: Secondary | ICD-10-CM

## 2022-11-26 DIAGNOSIS — I10 Essential (primary) hypertension: Secondary | ICD-10-CM | POA: Diagnosis not present

## 2022-11-26 DIAGNOSIS — J9611 Chronic respiratory failure with hypoxia: Secondary | ICD-10-CM | POA: Diagnosis not present

## 2022-11-26 DIAGNOSIS — M6389 Disorders of muscle in diseases classified elsewhere, multiple sites: Secondary | ICD-10-CM | POA: Diagnosis not present

## 2022-11-26 DIAGNOSIS — I5032 Chronic diastolic (congestive) heart failure: Secondary | ICD-10-CM

## 2022-11-26 DIAGNOSIS — R001 Bradycardia, unspecified: Secondary | ICD-10-CM | POA: Diagnosis not present

## 2022-11-26 DIAGNOSIS — K219 Gastro-esophageal reflux disease without esophagitis: Secondary | ICD-10-CM

## 2022-11-26 DIAGNOSIS — G8191 Hemiplegia, unspecified affecting right dominant side: Secondary | ICD-10-CM

## 2022-11-26 DIAGNOSIS — I69351 Hemiplegia and hemiparesis following cerebral infarction affecting right dominant side: Secondary | ICD-10-CM | POA: Diagnosis not present

## 2022-11-26 DIAGNOSIS — R278 Other lack of coordination: Secondary | ICD-10-CM | POA: Diagnosis not present

## 2022-11-26 DIAGNOSIS — I63312 Cerebral infarction due to thrombosis of left middle cerebral artery: Secondary | ICD-10-CM | POA: Diagnosis not present

## 2022-11-26 NOTE — Progress Notes (Signed)
Location:  [facility] Room Number: 116/A Place of Service:  SNF [phone]) Provider: Octavia Heir, NP   [name], MD  Patient Care Team: [name], MD as PCP - General (Internal Medicine) [name], MD as PCP - Cardiology (Cardiology) Duke Salvia, MD as PCP - Electrophysiology (Cardiology) Shirlean Kelly, MD as Consulting Physician (Neurosurgery)  Extended Emergency Contact Information Primary Emergency Contact: Szeliga,[name] Address: [address]          [place] [phone] [name] Home Phone: [phone] Mobile Phone: [phone] Relation: Spouse Secondary Emergency Contact: Osborne,[name] Address: [address]          [place], [place] [address] [name] Mobile Phone: [phone] Relation: Son  Code Status: Full code Goals of care: Advanced Directive information    10/15/2022    9:53 AM  Advanced Directives  Does Patient Have a Medical Advance Directive? Yes  Type of Advance Directive Living will  Does patient want to make changes to medical advance directive? No - Patient declined     Chief Complaint  Patient presents with   Medical Management of Chronic Issues    HPI:  Pt is a 77 y.o. female seen today for medical management of chronic diseases.    She currently resides on the skilled nursing unit at [facility]. 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 Denies symptoms today.   Recent blood pressures:  07/25- 108/66, 92/60  07/24- 163/78  07/23- 129/76  Recent weights:  07/06- 95.9 lbs  07/01- 93.9 lbs  06/01- 99.4 lbs      Past Medical History:  Diagnosis Date   Anxiety    Arthritis    "some in my lower back; probably elbows, knees" (11/18/2017)   Atrial fibrillation (HCC)    Bell's palsy    when pt. was 77 yrs old, when under stress the left side of face will droop.   Complication of anesthesia    "vascular OR 2016; BP bottomed out; couldn't get it regulated; ended up in ICU for DAYS" (11/18/2017)   GERD (gastroesophageal reflux disease)    History of kidney stones    Hypertension    Hypertrophic cardiomyopathy (HCC)    severe LV basilar hypertrophy witn no evidence of significant outflow tract obstruction, EF 65-70%, mild LAE, mild TR, grade 1a diastolic dysfunction 05/15/10 (Dr. Donato Schultz) (Atrial Septal Hypertrophy pattern)-- Intra-op TEE with dsignificant outflow tract obstruction - AI,  MR & TR   Insomnia    Mild aortic sclerosis    Osteopenia    Peripheral vascular disease (HCC)    Syncope    , Vagal   Past Surgical History:  Procedure Laterality Date   AUGMENTATION MAMMAPLASTY Bilateral    BACK SURGERY     CARDIAC CATHETERIZATION N/A 05/07/2016   Procedure: Left Heart Cath and Coronary Angiography;   Surgeon: Kathleene Hazel, MD;  Location: [facility] INVASIVE CV LAB;  Service: Cardiovascular;  Laterality: N/A;   CARDIOVERSION N/A 09/24/2017   Procedure: CARDIOVERSION;  Surgeon: Laurey Morale, MD;  Location: [facility] ENDOSCOPY;  Service: Cardiovascular;  Laterality: N/A;   DILATION AND CURETTAGE OF UTERUS     ENDARTERECTOMY FEMORAL Right 03/02/2015   Procedure: ENDARTERECTOMY RIGHT FEMORAL;  Surgeon: Chuck Hint, MD;  Location: [facility] OR;  Service: Vascular;  Laterality: Right;   ESOPHAGOGASTRODUODENOSCOPY (EGD) WITH PROPOFOL N/A 12/01/2020   Procedure: ESOPHAGOGASTRODUODENOSCOPY (EGD) WITH PROPOFOL;  Surgeon: Diamantina Monks, MD;  Location: [facility] ENDOSCOPY;  Service: General;  Laterality: N/A;   FACIAL COSMETIC SURGERY Left 2002   "related to Bell's Palsy @ age 59; left eye/side of face droopy; tried to make area symmetrical"   FEMORAL-POPLITEAL BYPASS GRAFT Right 03/02/2015   Procedure: BYPASS GRAFT FEMORAL-BELOW KNEE POPLITEAL ARTERY;  Surgeon: Chuck Hint, MD;  Location: [facility] OR;  Service: Vascular;  Laterality: Right;   INGUINAL HERNIA REPAIR Bilateral 2002   IR ANGIO INTRA EXTRACRAN SEL COM CAROTID INNOMINATE UNI L MOD SED  11/16/2020   IR CT HEAD LTD  11/13/2020   IR PERCUTANEOUS ART THROMBECTOMY/INFUSION INTRACRANIAL INC DIAG ANGIO  11/13/2020   OVARIAN CYST REMOVAL Left    PEG PLACEMENT N/A 12/01/2020   Procedure: PERCUTANEOUS ENDOSCOPIC GASTROSTOMY (PEG) PLACEMENT;  Surgeon: Diamantina Monks, MD;  Location: [facility] ENDOSCOPY;  Service: General;  Laterality: N/A;   PERIPHERAL VASCULAR CATHETERIZATION N/A 01/16/2015   Procedure: Abdominal Aortogram;  Surgeon: Chuck Hint, MD;  Location: [facility] INVASIVE CV LAB;  Service: Cardiovascular;  Laterality: N/A;   POSTERIOR LUMBAR FUSION  2015   "have plates and screws in there"   RADIOLOGY WITH ANESTHESIA N/A 11/13/2020   Procedure: IR WITH ANESTHESIA;  Surgeon: Radiologist, Medication, MD;  Location: [facility] OR;  Service: Radiology;  Laterality:  N/A;   TONSILLECTOMY      Allergies  Allergen Reactions   Amoxicillin Other (See Comments)    UTI Has patient had a PCN reaction causing immediate rash, facial/tongue/throat swelling, SOB or lightheadedness with hypotension: No Has patient had a PCN reaction causing severe rash involving mucus membranes or skin necrosis: No Has patient had a PCN reaction that required hospitalization: No Has patient had a PCN reaction occurring within the last 10 years: Yes--UTI ONLY If all of the above answers are "NO", then may proceed with Cephalosporin use.    Atenolol Cough   Crestor [Rosuvastatin Calcium] Other (See Comments)    Muscle aches   Pravastatin Other (See Comments)    Muscle aches   Rosuvastatin Calcium    Sulfa Antibiotics Nausea Only   Codeine Other (See Comments)    hallucinations    Outpatient Encounter Medications as of 11/26/2022  Medication Sig   acetaminophen (TYLENOL) 325 MG tablet Take 650 mg by mouth every 6 (six) hours as needed for mild pain or fever.   amiodarone (PACERONE) 200 MG tablet Take 200 mg by mouth daily. May give crushed   amLODipine (NORVASC) 5 MG tablet Take 5 mg by mouth daily.  apixaban (ELIQUIS) 5 MG TABS tablet Take 5 mg by mouth 2 (two) times daily. May give crushed   bisacodyl (DULCOLAX) 5 MG EC tablet Take 5 mg by mouth 2 (two) times daily as needed for moderate constipation.   buPROPion (WELLBUTRIN XL) 150 MG 24 hr tablet Take 150 mg by mouth daily.   diclofenac Sodium (VOLTAREN) 1 % GEL Apply one application to right hip and knee four times daily as needed.   Emollient (CERAVE) CREA Apply 1 Application topically as needed.   ezetimibe (ZETIA) 10 MG tablet Take 10 mg by mouth daily. Oral if crushed   furosemide (LASIX) 20 MG tablet Take 20 mg by mouth daily as needed. Give Lasix 20mg  for weight gain equal or greater than 3 pounds   hydrALAZINE (APRESOLINE) 10 MG tablet Take 10 mg by mouth 3 (three) times daily as needed (SBP > 165 or DBP > 95).    hydrocortisone cream 1 % Apply 1 Application topically 2 (two) times daily as needed for itching.   ipratropium-albuterol (DUONEB) 0.5-2.5 (3) MG/3ML SOLN Inhale 3 mLs into the lungs every 4 (four) hours as needed.   ketoconazole (NIZORAL) 2 % cream Apply 1 application  topically daily as needed for irritation.   nystatin cream (MYCOSTATIN) Apply 1 application topically 2 (two) times daily as needed for dry skin (apply to perirectal rash).   ondansetron (ZOFRAN) 4 MG tablet Take 4 mg by mouth every 6 (six) hours as needed for nausea or vomiting.   OXYGEN Inhale 3 L into the lungs as directed. to maintain sats > or equal to 90%. May titrate Three Times A Day; 07:00 AM - 03:00 PM, 03:00 PM - 11:00 PM, 11:00 PM - 7:00 am   pantoprazole (PROTONIX) 40 MG tablet Take 40 mg by mouth 2 (two) times daily.   polyethylene glycol powder (GLYCOLAX/MIRALAX) 17 GM/SCOOP powder Take 17 g by mouth daily.   Tiotropium Bromide-Olodaterol (STIOLTO RESPIMAT) 2.5-2.5 MCG/ACT AERS Inhale 2 puffs into the lungs daily.   Facility-Administered Encounter Medications as of 11/26/2022  Medication   botulinum toxin Type A (BOTOX) injection 100 Units    Review of Systems  Constitutional:  Negative for activity change and appetite change.  HENT:  Negative for congestion and sore throat.   Respiratory:  Positive for cough. Negative for shortness of breath and wheezing.   Cardiovascular:  Negative for chest pain and leg swelling.  Gastrointestinal:  Negative for abdominal distention and abdominal pain.  Genitourinary:  Negative for dysuria, frequency and hematuria.  Musculoskeletal:  Positive for gait problem.  Skin:  Negative for wound.  Neurological:  Positive for weakness. Negative for dizziness and headaches.  Psychiatric/Behavioral:  Negative for agitation, confusion and dysphoric mood. The patient is not nervous/anxious.     Immunization History  Administered Date(s) Administered   Fluad Quad(high Dose 65+)  02/05/2022   Influenza Split 01/16/2009, 04/30/2010, 01/26/2016, 02/07/2018, 02/01/2020   Influenza, High Dose Seasonal PF 02/07/2018, 01/25/2019, 02/28/2021, 02/05/2022   Influenza,inj,Quad PF,6+ Mos 03/06/2012, 01/26/2016, 01/10/2017   Influenza,inj,quad, With Preservative 02/14/2015   Moderna Covid-19 Vaccine Bivalent Booster 85yrs & up 02/28/2021   PFIZER(Purple Top)SARS-COV-2 Vaccination 05/06/2019, 05/27/2019, 01/27/2020, 09/18/2020   Pneumococcal Conjugate-13 03/19/2017   Pneumococcal Polysaccharide-23 02/14/2015   Respiratory Syncytial Virus Vaccine,Recomb Aduvanted(Arexvy) 02/15/2022   Td 02/25/2005   Tdap 05/19/2013   Zoster, Live 07/28/2006   Pertinent  Health Maintenance Due  Topic Date Due   INFLUENZA VACCINE  11/14/2022   DEXA SCAN  Discontinued  07/08/2021    9:00 AM 07/08/2021    9:30 PM 07/09/2021    8:00 AM 05/07/2022   11:59 AM 08/13/2022   11:02 AM  Fall Risk  Falls in the past year?    0 1  Was there an injury with Fall?    0 0  Fall Risk Category Calculator    0 1  (RETIRED) Patient Fall Risk Level High fall risk High fall risk Moderate fall risk    Patient at Risk for Falls Due to    No Fall Risks History of fall(s);Impaired balance/gait;Impaired mobility  Patient at Risk for Falls Due to - Comments     right sided hemiparesis  Fall risk Follow up    Falls evaluation completed Falls evaluation completed;Education provided;Falls prevention discussed   Functional Status Survey:    Vitals:   11/26/22 1225  BP: 108/66  Pulse: (!) 52  Resp: 18  Temp: 97.7 F (36.5 C)  SpO2: 94%  Weight: 95 lb 14.4 oz (43.5 kg)  Height: 5\' 3"  (1.6 m)   Body mass index is 16.99 kg/m. Physical Exam Vitals reviewed.  Constitutional:      General: She is not in acute distress. HENT:     Head: Normocephalic.  Eyes:     General:        Right eye: No discharge.        Left eye: No discharge.  Cardiovascular:     Rate and Rhythm: Normal rate and regular rhythm.      Pulses: Normal pulses.     Heart sounds: Normal heart sounds.  Pulmonary:     Effort: Pulmonary effort is normal. No respiratory distress.     Breath sounds: Normal breath sounds. No wheezing.  Abdominal:     General: Bowel sounds are normal.     Palpations: Abdomen is soft.  Musculoskeletal:     Cervical back: Neck supple.     Right lower leg: No edema.     Left lower leg: No edema.  Skin:    General: Skin is warm.     Capillary Refill: Capillary refill takes less than 2 seconds.  Neurological:     General: No focal deficit present.     Mental Status: She is alert and oriented to person, place, and time.     Motor: Weakness present.     Gait: Gait abnormal.     Comments: Right sided weakness  Psychiatric:        Mood and Affect: Mood normal.        Speech: Speech is slurred.        Behavior: Behavior normal.     Labs reviewed: Recent Labs    12/03/21 0000 03/18/22 0500  NA 139 140  K 4.4 4.3  CL 104 106  CO2 27* 26*  BUN 28* 31*  CREATININE 0.9 0.9  CALCIUM 9.0 8.9   Recent Labs    12/03/21 0000 12/10/21 0000 03/18/22 0500  AST 38* 32 33  ALT 48* 38* 39*  ALKPHOS 122 122 89  ALBUMIN 3.4* 3.5 3.4*   Recent Labs    03/18/22 0500 09/19/22 0000  WBC 5.9 7.4  HGB 13.3 14.0  HCT 41 43  PLT 224 246   Lab Results  Component Value Date   TSH 0.61 08/08/2022   Lab Results  Component Value Date   HGBA1C 6.0 (H) 11/13/2020   Lab Results  Component Value Date   CHOL 182 11/22/2021   HDL 80 (A) 11/22/2021  LDLCALC 89 11/22/2021   TRIG 64 11/22/2021   CHOLHDL 3.0 11/13/2020    Significant Diagnostic Results in last 30 days:  No results found.  Assessment/Plan 1. Dizziness - resolved with increased po intake  2. Bradycardia - followed by cardiology - off metoprolol - on amiodarone  3. Pulmonary emphysema, unspecified emphysema type (HCC) - followed by pulmonary - cont Stiolto and duoenbs  4. Chronic respiratory failure with hypoxia  (HCC) - cont oxygen   5. 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 Essential hypertension - controlled with amlodipine  7. Mixed hyperlipidemia - LDL 93 06/06 - h/o Zetia - cont Zetia  8. Chronic diastolic congestive heart failure (HCC) - compensated - cont furosemide  9. Moderate episode of recurrent major depressive disorder (HCC) - improved since using PWC> more social  - cont Wellbutrin  10. Gastroesophageal reflux disease without esophagitis - hgb stable - cont Protonix    Family/ staff Communication: plan discussed with patient and nurse  Labs/tests ordered:  none

## 2022-11-27 ENCOUNTER — Ambulatory Visit: Payer: Medicare Other | Admitting: Neurology

## 2022-11-27 VITALS — BP 104/66

## 2022-11-27 DIAGNOSIS — R278 Other lack of coordination: Secondary | ICD-10-CM | POA: Diagnosis not present

## 2022-11-27 DIAGNOSIS — G8191 Hemiplegia, unspecified affecting right dominant side: Secondary | ICD-10-CM | POA: Diagnosis not present

## 2022-11-27 DIAGNOSIS — I63312 Cerebral infarction due to thrombosis of left middle cerebral artery: Secondary | ICD-10-CM | POA: Diagnosis not present

## 2022-11-27 DIAGNOSIS — I69351 Hemiplegia and hemiparesis following cerebral infarction affecting right dominant side: Secondary | ICD-10-CM | POA: Diagnosis not present

## 2022-11-27 DIAGNOSIS — M6389 Disorders of muscle in diseases classified elsewhere, multiple sites: Secondary | ICD-10-CM | POA: Diagnosis not present

## 2022-11-27 DIAGNOSIS — G8111 Spastic hemiplegia affecting right dominant side: Secondary | ICD-10-CM | POA: Diagnosis not present

## 2022-11-27 DIAGNOSIS — I5032 Chronic diastolic (congestive) heart failure: Secondary | ICD-10-CM | POA: Diagnosis not present

## 2022-11-27 MED ORDER — ONABOTULINUMTOXINA 200 UNITS IJ SOLR
200.0000 [IU] | Freq: Once | INTRAMUSCULAR | Status: AC
Start: 2022-11-27 — End: 2022-11-27
  Administered 2022-11-27: 200 [IU] via INTRAMUSCULAR

## 2022-11-27 NOTE — Progress Notes (Signed)
Botox- 100 units x 2 vial Lot: [id number] Expiration: 11.2026 NDC: 0023-3921-02  Bacteriostatic 0.9% Sodium Chloride- 4 mL  Lot: [id number] Expiration: 04.01.2025 NDC: 2841324401  Dx: G81.91. B/B Witnessed by Cinda Quest, CMA

## 2022-11-27 NOTE — Progress Notes (Signed)
Chief Complaint  Patient presents with   Procedure    Rm17, husband Chrissie Noa) present Pt is well and ready for injection      ASSESSMENT AND PLAN  Beverly Li is a 77 y.o. 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  Stroke happened while she was off Eliquis for 1 week for abdominal hematoma, with history of atrial fibrillation  Now with residual aphasia, right hemiparesis,  Electrical stimulation guided Botox a injection for spastic right upper extremity (100 units of Botox a dissolving to 2 cc of normal saline), 1st injection was on May 22 2022  Right pectoralis major 50 units Right latissimus dorsi 50 units Right teres major 25 units Right flexor digitorum profundus 25  units Right pronator teres 25 units Right brachialis 25  units    DIAGNOSTIC DATA (LABS, IMAGING, TESTING) - I reviewed patient records, labs, notes, testing and imaging myself where available.   MEDICAL HISTORY:  Beverly Li is a 77 year old female, accompanied by her husband, seen in request by his primary care at [facility] Dr. Einar Crow for evaluation of botulism toxin injection for spastic right hemiparesis, she is also seen by our clinic for stroke follow-up   I reviewed and summarized the referring note.PMHx. A fib,  Eliquis HTN Peripheral vascular disease. COPD   She suffered stroke on November 13, 2020 presenting with right-sided weakness, right facial droop, left gaze preference,  MRI showed left ACA/MCA stroke, status post IR with TICI, reperfusion of left MCA, in the setting of paroxysmal atrial fibrillation taking off Eliquis for 1 week due to abdominal hematoma  CT angiogram head and neck and four-vessel angiogram postprocedure showed status post revascularization of the occluded left middle cerebral artery M1 segment, and proximal flow arrest achieved TICI 3 revascularization, severe preocclusive stenosis of the proximal right internal carotid artery associated with a  delayed strain signs,  Known history of peripheral vascular disease, CT angiogram November 25, 2020 suspicious for occlusion of distal right femoral-popliteal bypass, low cardiac output, and distal high-grade stenosis/resistance also contributed to the unopacified distal bypass conduit, Absent of right side tibial arterial opacification, moderate to advanced aortic atherosclerosis bilateral iliac arterial disease, with no evidence of high-grade aortoiliac disease.  Echocardiogram in March 2023, ejection fraction 60 to 65%, no regional wall motion abnormality, moderate asymmetric left ventricular hypertrophy of the basal septal segment  She is now at [facility], with right hemiparesis, aphasia, husband reported that she continued to make small progress even now, physical therapy have suggested botulism toxin injection to alleviate her right shoulder and arm weakness   UPDATE May 22 2022: She is accompanied by her husband, receiving first electrical stimulation guided Botox injection for spastic right upper extremity, use of Botox a 100 units x 2, potential side effect explained,  UPDATE Aug 21 2022: She responded well to previous injection, there was no significant side effect noticed, tolerating Botox a 100 units x 2,  UPDATE August 14th 2024: She is accompanied by her husband at today's clinical visit, overall doing well, no side effect noted, every 3 months Botox injection has relaxed left shoulder, elbow,  PHYSICAL EXAM:   Vitals:   11/27/22 1406  BP: 104/66     There is no height or weight on file to calculate BMI.  PHYSICAL EXAMNIATION:  Gen: NAD, conversant, well nourised, well groomed                     Cardiovascular:  Regular rate rhythm, no peripheral edema, warm, nontender. Eyes: Conjunctivae clear without exudates or hemorrhage Neck: Supple, no carotid bruits. Pulmonary: Clear to auscultation bilaterally   NEUROLOGICAL EXAM:  MENTAL STATUS: Speech/cognition:  Global aphasia, paraphasic errors, slow in comprehension, word finding difficulties CRANIAL NERVES: CN II: Visual fields are full to confrontation. Pupils are round equal and briskly reactive to light. CN III, IV, VI: extraocular movement are normal. No ptosis. CN V: Facial sensation is intact to light touch CN VII: Right lower face weakness CN VIII: Hearing is normal to causal conversation. CN IX, X: Phonation is normal. CN XI: Head turning and shoulder shrug are intact  MOTOR: Right hemiparesis, mild spasticity of right upper extremity, no significant right upper extremity movement, good range of motion of right shoulder, elbow,    REVIEW OF SYSTEMS:  Full 14 system review of systems performed and notable only for as above All other review of systems were negative.   ALLERGIES: Allergies  Allergen Reactions   Amoxicillin Other (See Comments)    UTI Has patient had a PCN reaction causing immediate rash, facial/tongue/throat swelling, SOB or lightheadedness with hypotension: No Has patient had a PCN reaction causing severe rash involving mucus membranes or skin necrosis: No Has patient had a PCN reaction that required hospitalization: No Has patient had a PCN reaction occurring within the last 10 years: Yes--UTI ONLY If all of the above answers are "NO", then may proceed with Cephalosporin use.    Atenolol Cough   Crestor [Rosuvastatin Calcium] Other (See Comments)    Muscle aches   Pravastatin Other (See Comments)    Muscle aches   Rosuvastatin Calcium    Sulfa Antibiotics Nausea Only   Codeine Other (See Comments)    hallucinations    HOME MEDICATIONS: Current Outpatient Medications  Medication Sig Dispense Refill   acetaminophen (TYLENOL) 325 MG tablet Take 650 mg by mouth every 6 (six) hours as needed for mild pain or fever.     amiodarone (PACERONE) 200 MG tablet Take 200 mg by mouth daily. May give crushed     amLODipine (NORVASC) 5 MG tablet Take 5 mg by mouth  daily.     apixaban (ELIQUIS) 5 MG TABS tablet Take 5 mg by mouth 2 (two) times daily. May give crushed     bisacodyl (DULCOLAX) 5 MG EC tablet Take 5 mg by mouth 2 (two) times daily as needed for moderate constipation.     buPROPion (WELLBUTRIN XL) 150 MG 24 hr tablet Take 150 mg by mouth daily.     diclofenac Sodium (VOLTAREN) 1 % GEL Apply one application to right hip and knee four times daily as needed.     Emollient (CERAVE) CREA Apply 1 Application topically as needed.     ezetimibe (ZETIA) 10 MG tablet Take 10 mg by mouth daily. Oral if crushed     furosemide (LASIX) 20 MG tablet Take 20 mg by mouth daily as needed. Give Lasix 20mg  for weight gain equal or greater than 3 pounds     hydrALAZINE (APRESOLINE) 10 MG tablet Take 10 mg by mouth 3 (three) times daily as needed (SBP > 165 or DBP > 95).     hydrocortisone cream 1 % Apply 1 Application topically 2 (two) times daily as needed for itching.     ipratropium-albuterol (DUONEB) 0.5-2.5 (3) MG/3ML SOLN Inhale 3 mLs into the lungs every 4 (four) hours as needed.     ketoconazole (NIZORAL) 2 % cream Apply 1 application  topically  daily as needed for irritation.     nystatin cream (MYCOSTATIN) Apply 1 application topically 2 (two) times daily as needed for dry skin (apply to perirectal rash).     ondansetron (ZOFRAN) 4 MG tablet Take 4 mg by mouth every 6 (six) hours as needed for nausea or vomiting.     OXYGEN Inhale 3 L into the lungs as directed. to maintain sats > or equal to 90%. May titrate Three Times A Day; 07:00 AM - 03:00 PM, 03:00 PM - 11:00 PM, 11:00 PM - 7:00 am     pantoprazole (PROTONIX) 40 MG tablet Take 40 mg by mouth 2 (two) times daily.     polyethylene glycol powder (GLYCOLAX/MIRALAX) 17 GM/SCOOP powder Take 17 g by mouth daily.     Tiotropium Bromide-Olodaterol (STIOLTO RESPIMAT) 2.5-2.5 MCG/ACT AERS Inhale 2 puffs into the lungs daily.     Current Facility-Administered Medications  Medication Dose Route Frequency  Provider Last Rate Last Admin   botulinum toxin Type A (BOTOX) injection 100 Units  100 Units Intramuscular Once Levert Feinstein, MD       botulinum toxin Type A (BOTOX) injection 200 Units  200 Units Intramuscular Once Levert Feinstein, MD        PAST MEDICAL HISTORY: Past Medical History:  Diagnosis Date   Anxiety    Arthritis    "some in my lower back; probably elbows, knees" (11/18/2017)   Atrial fibrillation (HCC)    Bell's palsy    when pt. was 77 yrs old, when under stress the left side of face will droop.   Complication of anesthesia    "vascular OR 2016; BP bottomed out; couldn't get it regulated; ended up in ICU for DAYS" (11/18/2017)   GERD (gastroesophageal reflux disease)    History of kidney stones    Hypertension    Hypertrophic cardiomyopathy (HCC)    severe LV basilar hypertrophy witn no evidence of significant outflow tract obstruction, EF 65-70%, mild LAE, mild TR, grade 1a diastolic dysfunction 05/15/10 (Dr. Donato Schultz) (Atrial Septal Hypertrophy pattern)-- Intra-op TEE with dsignificant outflow tract obstruction - AI, MR & TR   Insomnia    Mild aortic sclerosis    Osteopenia    Peripheral vascular disease (HCC)    Syncope    , Vagal    PAST SURGICAL HISTORY: Past Surgical History:  Procedure Laterality Date   AUGMENTATION MAMMAPLASTY Bilateral    BACK SURGERY     CARDIAC CATHETERIZATION N/A 05/07/2016   Procedure: Left Heart Cath and Coronary Angiography;  Surgeon: Kathleene Hazel, MD;  Location: [facility] INVASIVE CV LAB;  Service: Cardiovascular;  Laterality: N/A;   CARDIOVERSION N/A 09/24/2017   Procedure: CARDIOVERSION;  Surgeon: Laurey Morale, MD;  Location: [facility] ENDOSCOPY;  Service: Cardiovascular;  Laterality: N/A;   DILATION AND CURETTAGE OF UTERUS     ENDARTERECTOMY FEMORAL Right 03/02/2015   Procedure: ENDARTERECTOMY RIGHT FEMORAL;  Surgeon: Chuck Hint, MD;  Location: [facility] OR;  Service: Vascular;  Laterality: Right;   ESOPHAGOGASTRODUODENOSCOPY (EGD)  WITH PROPOFOL N/A 12/01/2020   Procedure: ESOPHAGOGASTRODUODENOSCOPY (EGD) WITH PROPOFOL;  Surgeon: Diamantina Monks, MD;  Location: [facility] ENDOSCOPY;  Service: General;  Laterality: N/A;   FACIAL COSMETIC SURGERY Left 2002   "related to Bell's Palsy @ age 45; left eye/side of face droopy; tried to make area symmetrical"   FEMORAL-POPLITEAL BYPASS GRAFT Right 03/02/2015   Procedure: BYPASS GRAFT FEMORAL-BELOW KNEE POPLITEAL ARTERY;  Surgeon: Chuck Hint, MD;  Location: [facility] OR;  Service: Vascular;  Laterality: Right;   INGUINAL HERNIA REPAIR Bilateral 2002   IR ANGIO INTRA EXTRACRAN SEL COM CAROTID INNOMINATE UNI L MOD SED  11/16/2020   IR CT HEAD LTD  11/13/2020   IR PERCUTANEOUS ART THROMBECTOMY/INFUSION INTRACRANIAL INC DIAG ANGIO  11/13/2020   OVARIAN CYST REMOVAL Left    PEG PLACEMENT N/A 12/01/2020   Procedure: PERCUTANEOUS ENDOSCOPIC GASTROSTOMY (PEG) PLACEMENT;  Surgeon: Diamantina Monks, MD;  Location: [facility] ENDOSCOPY;  Service: General;  Laterality: N/A;   PERIPHERAL VASCULAR CATHETERIZATION N/A 01/16/2015   Procedure: Abdominal Aortogram;  Surgeon: Chuck Hint, MD;  Location: [facility] INVASIVE CV LAB;  Service: Cardiovascular;  Laterality: N/A;   POSTERIOR LUMBAR FUSION  2015   "have plates and screws in there"   RADIOLOGY WITH ANESTHESIA N/A 11/13/2020   Procedure: IR WITH ANESTHESIA;  Surgeon: Radiologist, Medication, MD;  Location: [facility] OR;  Service: Radiology;  Laterality: N/A;   TONSILLECTOMY      FAMILY HISTORY: Family History  Problem Relation Age of Onset   Liver cancer Mother    Cancer Mother        Liver   Hypertension Mother    Lung cancer Father    Cancer Father        Lung   Breast cancer Sister    Cancer Sister        Breast    SOCIAL HISTORY: Social History   Socioeconomic History   Marital status: Married    Spouse name: Optometrist   Number of children: Not on file   Years of education: Not on file   Highest education level: Not on file  Occupational History    Not on file  Tobacco Use   Smoking status: Former    Current packs/day: 0.00    Average packs/day: 1 pack/day for 50.0 years (50.0 ttl pk-yrs)    Types: Cigarettes    Start date: 12/14/1964    Quit date: 12/15/2014    Years since quitting: 7.9   Smokeless tobacco: Never  Vaping Use   Vaping status: Never Used  Substance and Sexual Activity   Alcohol use: Yes    Comment: 11/18/2017 "might have a couple glasses of wine/month; if that"   Drug use: Not on file   Sexual activity: Not Currently  Other Topics Concern   Not on file  Social History Narrative   11/21/21 living at [facility]   Social Determinants of Health   Financial Resource Strain: Low Risk  (08/13/2022)   Overall Financial Resource Strain (CARDIA)    Difficulty of Paying Living Expenses: Not hard at all  Food Insecurity: No Food Insecurity (08/13/2022)   Hunger Vital Sign    Worried About Running Out of Food in the Last Year: Never true    Ran Out of Food in the Last Year: Never true  Transportation Needs: No Transportation Needs (08/13/2022)   PRAPARE - Administrator, Civil Service (Medical): No    Lack of Transportation (Non-Medical): No  Physical Activity: Insufficiently Active (08/13/2022)   Exercise Vital Sign    Days of Exercise per Week: 3 days    Minutes of Exercise per Session: 30 min  Stress: No Stress Concern Present (08/13/2022)   [employer] of Occupational Health - Occupational Stress Questionnaire    Feeling of Stress : Only a little  Social Connections: Moderately Isolated (08/13/2022)   Social Connection and Isolation Panel [NHANES]    Frequency of Communication with Friends and Family: Patient unable to answer  Frequency of Social Gatherings with Friends and Family: More than three times a week    Attends Religious Services: Never    Database administrator or Organizations: No    Attends Banker Meetings: Never    Marital Status: Married  Catering manager  Violence: Not At Risk (08/13/2022)   Humiliation, Afraid, Rape, and Kick questionnaire    Fear of Current or Ex-Partner: No    Emotionally Abused: No    Physically Abused: No    Sexually Abused: No      Levert Feinstein, M.D. Ph.D.  [facility] [address] [place], [place] [address] Ph: [phone] Fax: [phone]  CC:  [name], MD [address] [place],  [place] [address]  [name], MD

## 2022-11-28 DIAGNOSIS — I69351 Hemiplegia and hemiparesis following cerebral infarction affecting right dominant side: Secondary | ICD-10-CM | POA: Diagnosis not present

## 2022-11-28 DIAGNOSIS — I63312 Cerebral infarction due to thrombosis of left middle cerebral artery: Secondary | ICD-10-CM | POA: Diagnosis not present

## 2022-11-28 DIAGNOSIS — R278 Other lack of coordination: Secondary | ICD-10-CM | POA: Diagnosis not present

## 2022-11-28 DIAGNOSIS — M6389 Disorders of muscle in diseases classified elsewhere, multiple sites: Secondary | ICD-10-CM | POA: Diagnosis not present

## 2022-11-28 DIAGNOSIS — I5032 Chronic diastolic (congestive) heart failure: Secondary | ICD-10-CM | POA: Diagnosis not present

## 2022-12-02 DIAGNOSIS — R278 Other lack of coordination: Secondary | ICD-10-CM | POA: Diagnosis not present

## 2022-12-02 DIAGNOSIS — M6389 Disorders of muscle in diseases classified elsewhere, multiple sites: Secondary | ICD-10-CM | POA: Diagnosis not present

## 2022-12-02 DIAGNOSIS — I69351 Hemiplegia and hemiparesis following cerebral infarction affecting right dominant side: Secondary | ICD-10-CM | POA: Diagnosis not present

## 2022-12-02 DIAGNOSIS — I5032 Chronic diastolic (congestive) heart failure: Secondary | ICD-10-CM | POA: Diagnosis not present

## 2022-12-02 DIAGNOSIS — I63312 Cerebral infarction due to thrombosis of left middle cerebral artery: Secondary | ICD-10-CM | POA: Diagnosis not present

## 2022-12-03 DIAGNOSIS — M6389 Disorders of muscle in diseases classified elsewhere, multiple sites: Secondary | ICD-10-CM | POA: Diagnosis not present

## 2022-12-03 DIAGNOSIS — I69351 Hemiplegia and hemiparesis following cerebral infarction affecting right dominant side: Secondary | ICD-10-CM | POA: Diagnosis not present

## 2022-12-03 DIAGNOSIS — R278 Other lack of coordination: Secondary | ICD-10-CM | POA: Diagnosis not present

## 2022-12-03 DIAGNOSIS — I63312 Cerebral infarction due to thrombosis of left middle cerebral artery: Secondary | ICD-10-CM | POA: Diagnosis not present

## 2022-12-03 DIAGNOSIS — I5032 Chronic diastolic (congestive) heart failure: Secondary | ICD-10-CM | POA: Diagnosis not present

## 2022-12-04 DIAGNOSIS — I5032 Chronic diastolic (congestive) heart failure: Secondary | ICD-10-CM | POA: Diagnosis not present

## 2022-12-04 DIAGNOSIS — R278 Other lack of coordination: Secondary | ICD-10-CM | POA: Diagnosis not present

## 2022-12-04 DIAGNOSIS — M6389 Disorders of muscle in diseases classified elsewhere, multiple sites: Secondary | ICD-10-CM | POA: Diagnosis not present

## 2022-12-04 DIAGNOSIS — I63312 Cerebral infarction due to thrombosis of left middle cerebral artery: Secondary | ICD-10-CM | POA: Diagnosis not present

## 2022-12-04 DIAGNOSIS — I69351 Hemiplegia and hemiparesis following cerebral infarction affecting right dominant side: Secondary | ICD-10-CM | POA: Diagnosis not present

## 2022-12-05 DIAGNOSIS — I5032 Chronic diastolic (congestive) heart failure: Secondary | ICD-10-CM | POA: Diagnosis not present

## 2022-12-05 DIAGNOSIS — I69351 Hemiplegia and hemiparesis following cerebral infarction affecting right dominant side: Secondary | ICD-10-CM | POA: Diagnosis not present

## 2022-12-05 DIAGNOSIS — I63312 Cerebral infarction due to thrombosis of left middle cerebral artery: Secondary | ICD-10-CM | POA: Diagnosis not present

## 2022-12-05 DIAGNOSIS — J9611 Chronic respiratory failure with hypoxia: Secondary | ICD-10-CM | POA: Diagnosis not present

## 2022-12-05 DIAGNOSIS — R278 Other lack of coordination: Secondary | ICD-10-CM | POA: Diagnosis not present

## 2022-12-06 DIAGNOSIS — I69351 Hemiplegia and hemiparesis following cerebral infarction affecting right dominant side: Secondary | ICD-10-CM | POA: Diagnosis not present

## 2022-12-06 DIAGNOSIS — I63312 Cerebral infarction due to thrombosis of left middle cerebral artery: Secondary | ICD-10-CM | POA: Diagnosis not present

## 2022-12-06 DIAGNOSIS — R278 Other lack of coordination: Secondary | ICD-10-CM | POA: Diagnosis not present

## 2022-12-06 DIAGNOSIS — M6389 Disorders of muscle in diseases classified elsewhere, multiple sites: Secondary | ICD-10-CM | POA: Diagnosis not present

## 2022-12-06 DIAGNOSIS — I5032 Chronic diastolic (congestive) heart failure: Secondary | ICD-10-CM | POA: Diagnosis not present

## 2022-12-09 DIAGNOSIS — I63312 Cerebral infarction due to thrombosis of left middle cerebral artery: Secondary | ICD-10-CM | POA: Diagnosis not present

## 2022-12-09 DIAGNOSIS — R278 Other lack of coordination: Secondary | ICD-10-CM | POA: Diagnosis not present

## 2022-12-09 DIAGNOSIS — I69351 Hemiplegia and hemiparesis following cerebral infarction affecting right dominant side: Secondary | ICD-10-CM | POA: Diagnosis not present

## 2022-12-09 DIAGNOSIS — M6389 Disorders of muscle in diseases classified elsewhere, multiple sites: Secondary | ICD-10-CM | POA: Diagnosis not present

## 2022-12-09 DIAGNOSIS — I5032 Chronic diastolic (congestive) heart failure: Secondary | ICD-10-CM | POA: Diagnosis not present

## 2022-12-10 DIAGNOSIS — M6389 Disorders of muscle in diseases classified elsewhere, multiple sites: Secondary | ICD-10-CM | POA: Diagnosis not present

## 2022-12-10 DIAGNOSIS — R278 Other lack of coordination: Secondary | ICD-10-CM | POA: Diagnosis not present

## 2022-12-10 DIAGNOSIS — I63312 Cerebral infarction due to thrombosis of left middle cerebral artery: Secondary | ICD-10-CM | POA: Diagnosis not present

## 2022-12-10 DIAGNOSIS — I69351 Hemiplegia and hemiparesis following cerebral infarction affecting right dominant side: Secondary | ICD-10-CM | POA: Diagnosis not present

## 2022-12-10 DIAGNOSIS — I5032 Chronic diastolic (congestive) heart failure: Secondary | ICD-10-CM | POA: Diagnosis not present

## 2022-12-11 DIAGNOSIS — I5032 Chronic diastolic (congestive) heart failure: Secondary | ICD-10-CM | POA: Diagnosis not present

## 2022-12-11 DIAGNOSIS — I63312 Cerebral infarction due to thrombosis of left middle cerebral artery: Secondary | ICD-10-CM | POA: Diagnosis not present

## 2022-12-11 DIAGNOSIS — R278 Other lack of coordination: Secondary | ICD-10-CM | POA: Diagnosis not present

## 2022-12-11 DIAGNOSIS — M6389 Disorders of muscle in diseases classified elsewhere, multiple sites: Secondary | ICD-10-CM | POA: Diagnosis not present

## 2022-12-11 DIAGNOSIS — I69351 Hemiplegia and hemiparesis following cerebral infarction affecting right dominant side: Secondary | ICD-10-CM | POA: Diagnosis not present

## 2022-12-13 DIAGNOSIS — I63312 Cerebral infarction due to thrombosis of left middle cerebral artery: Secondary | ICD-10-CM | POA: Diagnosis not present

## 2022-12-13 DIAGNOSIS — M6389 Disorders of muscle in diseases classified elsewhere, multiple sites: Secondary | ICD-10-CM | POA: Diagnosis not present

## 2022-12-13 DIAGNOSIS — I5032 Chronic diastolic (congestive) heart failure: Secondary | ICD-10-CM | POA: Diagnosis not present

## 2022-12-13 DIAGNOSIS — I69351 Hemiplegia and hemiparesis following cerebral infarction affecting right dominant side: Secondary | ICD-10-CM | POA: Diagnosis not present

## 2022-12-13 DIAGNOSIS — R278 Other lack of coordination: Secondary | ICD-10-CM | POA: Diagnosis not present

## 2022-12-17 DIAGNOSIS — R293 Abnormal posture: Secondary | ICD-10-CM | POA: Diagnosis not present

## 2022-12-17 DIAGNOSIS — I63312 Cerebral infarction due to thrombosis of left middle cerebral artery: Secondary | ICD-10-CM | POA: Diagnosis not present

## 2022-12-17 DIAGNOSIS — I5032 Chronic diastolic (congestive) heart failure: Secondary | ICD-10-CM | POA: Diagnosis not present

## 2022-12-17 DIAGNOSIS — R278 Other lack of coordination: Secondary | ICD-10-CM | POA: Diagnosis not present

## 2022-12-17 DIAGNOSIS — I69351 Hemiplegia and hemiparesis following cerebral infarction affecting right dominant side: Secondary | ICD-10-CM | POA: Diagnosis not present

## 2022-12-17 DIAGNOSIS — M6389 Disorders of muscle in diseases classified elsewhere, multiple sites: Secondary | ICD-10-CM | POA: Diagnosis not present

## 2022-12-18 DIAGNOSIS — R278 Other lack of coordination: Secondary | ICD-10-CM | POA: Diagnosis not present

## 2022-12-18 DIAGNOSIS — I5032 Chronic diastolic (congestive) heart failure: Secondary | ICD-10-CM | POA: Diagnosis not present

## 2022-12-18 DIAGNOSIS — I63312 Cerebral infarction due to thrombosis of left middle cerebral artery: Secondary | ICD-10-CM | POA: Diagnosis not present

## 2022-12-18 DIAGNOSIS — I69351 Hemiplegia and hemiparesis following cerebral infarction affecting right dominant side: Secondary | ICD-10-CM | POA: Diagnosis not present

## 2022-12-18 DIAGNOSIS — M6389 Disorders of muscle in diseases classified elsewhere, multiple sites: Secondary | ICD-10-CM | POA: Diagnosis not present

## 2022-12-19 DIAGNOSIS — I63312 Cerebral infarction due to thrombosis of left middle cerebral artery: Secondary | ICD-10-CM | POA: Diagnosis not present

## 2022-12-19 DIAGNOSIS — J9611 Chronic respiratory failure with hypoxia: Secondary | ICD-10-CM | POA: Diagnosis not present

## 2022-12-19 DIAGNOSIS — I69351 Hemiplegia and hemiparesis following cerebral infarction affecting right dominant side: Secondary | ICD-10-CM | POA: Diagnosis not present

## 2022-12-19 DIAGNOSIS — R278 Other lack of coordination: Secondary | ICD-10-CM | POA: Diagnosis not present

## 2022-12-19 DIAGNOSIS — I5032 Chronic diastolic (congestive) heart failure: Secondary | ICD-10-CM | POA: Diagnosis not present

## 2022-12-23 DIAGNOSIS — I5032 Chronic diastolic (congestive) heart failure: Secondary | ICD-10-CM | POA: Diagnosis not present

## 2022-12-23 DIAGNOSIS — M6389 Disorders of muscle in diseases classified elsewhere, multiple sites: Secondary | ICD-10-CM | POA: Diagnosis not present

## 2022-12-23 DIAGNOSIS — R278 Other lack of coordination: Secondary | ICD-10-CM | POA: Diagnosis not present

## 2022-12-23 DIAGNOSIS — I69351 Hemiplegia and hemiparesis following cerebral infarction affecting right dominant side: Secondary | ICD-10-CM | POA: Diagnosis not present

## 2022-12-23 DIAGNOSIS — I63312 Cerebral infarction due to thrombosis of left middle cerebral artery: Secondary | ICD-10-CM | POA: Diagnosis not present

## 2022-12-24 DIAGNOSIS — M6389 Disorders of muscle in diseases classified elsewhere, multiple sites: Secondary | ICD-10-CM | POA: Diagnosis not present

## 2022-12-24 DIAGNOSIS — R278 Other lack of coordination: Secondary | ICD-10-CM | POA: Diagnosis not present

## 2022-12-24 DIAGNOSIS — I69351 Hemiplegia and hemiparesis following cerebral infarction affecting right dominant side: Secondary | ICD-10-CM | POA: Diagnosis not present

## 2022-12-24 DIAGNOSIS — I5032 Chronic diastolic (congestive) heart failure: Secondary | ICD-10-CM | POA: Diagnosis not present

## 2022-12-24 DIAGNOSIS — I63312 Cerebral infarction due to thrombosis of left middle cerebral artery: Secondary | ICD-10-CM | POA: Diagnosis not present

## 2022-12-25 DIAGNOSIS — I63312 Cerebral infarction due to thrombosis of left middle cerebral artery: Secondary | ICD-10-CM | POA: Diagnosis not present

## 2022-12-25 DIAGNOSIS — R278 Other lack of coordination: Secondary | ICD-10-CM | POA: Diagnosis not present

## 2022-12-25 DIAGNOSIS — M6389 Disorders of muscle in diseases classified elsewhere, multiple sites: Secondary | ICD-10-CM | POA: Diagnosis not present

## 2022-12-25 DIAGNOSIS — I69351 Hemiplegia and hemiparesis following cerebral infarction affecting right dominant side: Secondary | ICD-10-CM | POA: Diagnosis not present

## 2022-12-25 DIAGNOSIS — I5032 Chronic diastolic (congestive) heart failure: Secondary | ICD-10-CM | POA: Diagnosis not present

## 2022-12-26 DIAGNOSIS — J9611 Chronic respiratory failure with hypoxia: Secondary | ICD-10-CM | POA: Diagnosis not present

## 2022-12-26 DIAGNOSIS — I69351 Hemiplegia and hemiparesis following cerebral infarction affecting right dominant side: Secondary | ICD-10-CM | POA: Diagnosis not present

## 2022-12-26 DIAGNOSIS — I5032 Chronic diastolic (congestive) heart failure: Secondary | ICD-10-CM | POA: Diagnosis not present

## 2022-12-26 DIAGNOSIS — I63312 Cerebral infarction due to thrombosis of left middle cerebral artery: Secondary | ICD-10-CM | POA: Diagnosis not present

## 2022-12-26 DIAGNOSIS — R278 Other lack of coordination: Secondary | ICD-10-CM | POA: Diagnosis not present

## 2022-12-30 DIAGNOSIS — M6389 Disorders of muscle in diseases classified elsewhere, multiple sites: Secondary | ICD-10-CM | POA: Diagnosis not present

## 2022-12-30 DIAGNOSIS — I63312 Cerebral infarction due to thrombosis of left middle cerebral artery: Secondary | ICD-10-CM | POA: Diagnosis not present

## 2022-12-30 DIAGNOSIS — I5032 Chronic diastolic (congestive) heart failure: Secondary | ICD-10-CM | POA: Diagnosis not present

## 2022-12-30 DIAGNOSIS — R278 Other lack of coordination: Secondary | ICD-10-CM | POA: Diagnosis not present

## 2022-12-30 DIAGNOSIS — I69351 Hemiplegia and hemiparesis following cerebral infarction affecting right dominant side: Secondary | ICD-10-CM | POA: Diagnosis not present

## 2022-12-31 ENCOUNTER — Encounter: Payer: Self-pay | Admitting: Orthopedic Surgery

## 2022-12-31 ENCOUNTER — Non-Acute Institutional Stay (SKILLED_NURSING_FACILITY): Payer: Medicare Other | Admitting: Orthopedic Surgery

## 2022-12-31 DIAGNOSIS — K219 Gastro-esophageal reflux disease without esophagitis: Secondary | ICD-10-CM | POA: Diagnosis not present

## 2022-12-31 DIAGNOSIS — M6389 Disorders of muscle in diseases classified elsewhere, multiple sites: Secondary | ICD-10-CM | POA: Diagnosis not present

## 2022-12-31 DIAGNOSIS — I10 Essential (primary) hypertension: Secondary | ICD-10-CM | POA: Diagnosis not present

## 2022-12-31 DIAGNOSIS — H00012 Hordeolum externum right lower eyelid: Secondary | ICD-10-CM | POA: Diagnosis not present

## 2022-12-31 DIAGNOSIS — G8191 Hemiplegia, unspecified affecting right dominant side: Secondary | ICD-10-CM

## 2022-12-31 DIAGNOSIS — J439 Emphysema, unspecified: Secondary | ICD-10-CM | POA: Diagnosis not present

## 2022-12-31 DIAGNOSIS — F331 Major depressive disorder, recurrent, moderate: Secondary | ICD-10-CM

## 2022-12-31 DIAGNOSIS — I63312 Cerebral infarction due to thrombosis of left middle cerebral artery: Secondary | ICD-10-CM | POA: Diagnosis not present

## 2022-12-31 DIAGNOSIS — J9611 Chronic respiratory failure with hypoxia: Secondary | ICD-10-CM | POA: Diagnosis not present

## 2022-12-31 DIAGNOSIS — I480 Paroxysmal atrial fibrillation: Secondary | ICD-10-CM | POA: Diagnosis not present

## 2022-12-31 DIAGNOSIS — R278 Other lack of coordination: Secondary | ICD-10-CM | POA: Diagnosis not present

## 2022-12-31 DIAGNOSIS — I5032 Chronic diastolic (congestive) heart failure: Secondary | ICD-10-CM

## 2022-12-31 DIAGNOSIS — E782 Mixed hyperlipidemia: Secondary | ICD-10-CM

## 2022-12-31 DIAGNOSIS — I69351 Hemiplegia and hemiparesis following cerebral infarction affecting right dominant side: Secondary | ICD-10-CM | POA: Diagnosis not present

## 2022-12-31 MED ORDER — ERYTHROMYCIN 5 MG/GM OP OINT
1.0000 | TOPICAL_OINTMENT | Freq: Two times a day (BID) | OPHTHALMIC | Status: AC
Start: 2022-12-31 — End: 2023-01-07

## 2022-12-31 NOTE — Progress Notes (Signed)
Location:  [facility] Room Number: 116/A Place of Service:  SNF [id number]) Provider:  Octavia Heir, NP   [name], MD  Patient Care Team: [name], MD as PCP - General (Internal Medicine) [name], MD as PCP - Cardiology (Cardiology) Duke Salvia, MD as PCP - Electrophysiology (Cardiology) Shirlean Kelly, MD as Consulting Physician (Neurosurgery)  Extended Emergency Contact Information Primary Emergency Contact: Landau,[name] Address: [address]          [place] [phone] [name] Home Phone: [phone] Mobile Phone: [phone] Relation: Spouse Secondary Emergency Contact: Kneale,[name] Address: [address]          [place], [place] [address] [name] Mobile Phone: [phone] Relation: Son  Code Status: Full code Goals of care: Advanced Directive information    10/15/2022    9:53 AM  Advanced Directives  Does Patient Have a Medical Advance Directive? Yes  Type of Advance Directive Living will  Does patient want to make changes to medical advance directive? No - Patient declined     Chief Complaint  Patient presents with   Medical Management of Chronic Issues    HPI:  Pt is a 77 y.o. female seen today for medical management of chronic diseases.    She currently resides on the skilled nursing unit at [facility]. 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 Terrace Arabia 08/14 HTN- BUN/creat 24.9/0.75 09/19/2022, see trends below, remains on amlodipine HLD- total 185, LDL 93,  statin intolerance, remains on Zetia CHF- 2D echo LVEF 60-65% 06/2021, remains on furosemide prn PAF- TSH 0.61 08/08/2022, HR controlled with amiodarone, on Eliquis for clot prevention Depression- no mood changes, remains on Wellbutrin GERD- hgb 14.0 09/19/2022, remains on Protonix  Recent blood pressures:  09/10- 98/63  09/03- 100/61, 98/52  Recent weights:  09/01- patient refused  08/01- patient refused  07/01- 95.4 lbs      Past Medical History:  Diagnosis Date   Anxiety    Arthritis    "some in my lower back; probably elbows, knees" (11/18/2017)   Atrial fibrillation (HCC)    Bell's palsy    when pt. was 77 yrs old, when under stress the left side of face will droop.   Complication of anesthesia    "vascular OR 2016; BP bottomed out; couldn't get it regulated; ended up in ICU for DAYS" (11/18/2017)   GERD (gastroesophageal reflux disease)    History of kidney stones    Hypertension    Hypertrophic cardiomyopathy (HCC)    severe LV basilar hypertrophy witn no evidence of significant outflow tract obstruction, EF 65-70%, mild LAE, mild TR, grade 1a diastolic dysfunction 05/15/10 (Dr. Donato Schultz) (Atrial Septal Hypertrophy pattern)-- Intra-op TEE with dsignificant outflow tract obstruction - AI, MR & TR   Insomnia    Mild aortic sclerosis    Osteopenia    Peripheral vascular disease (HCC)    Syncope    , Vagal   Past Surgical History:  Procedure Laterality Date   AUGMENTATION MAMMAPLASTY Bilateral    BACK SURGERY     CARDIAC CATHETERIZATION N/A 05/07/2016  Procedure: Left Heart Cath and Coronary Angiography;  Surgeon: Kathleene Hazel, MD;  Location: [facility] INVASIVE CV LAB;  Service: Cardiovascular;  Laterality: N/A;   CARDIOVERSION N/A 09/24/2017   Procedure: CARDIOVERSION;  Surgeon: Laurey Morale, MD;  Location: [facility] ENDOSCOPY;  Service: Cardiovascular;  Laterality: N/A;   DILATION AND CURETTAGE OF UTERUS     ENDARTERECTOMY FEMORAL Right 03/02/2015   Procedure:  ENDARTERECTOMY RIGHT FEMORAL;  Surgeon: Chuck Hint, MD;  Location: [facility] OR;  Service: Vascular;  Laterality: Right;   ESOPHAGOGASTRODUODENOSCOPY (EGD) WITH PROPOFOL N/A 12/01/2020   Procedure: ESOPHAGOGASTRODUODENOSCOPY (EGD) WITH PROPOFOL;  Surgeon: Diamantina Monks, MD;  Location: [facility] ENDOSCOPY;  Service: General;  Laterality: N/A;   FACIAL COSMETIC SURGERY Left 2002   "related to Bell's Palsy @ age 25; left eye/side of face droopy; tried to make area symmetrical"   FEMORAL-POPLITEAL BYPASS GRAFT Right 03/02/2015   Procedure: BYPASS GRAFT FEMORAL-BELOW KNEE POPLITEAL ARTERY;  Surgeon: Chuck Hint, MD;  Location: [facility] OR;  Service: Vascular;  Laterality: Right;   INGUINAL HERNIA REPAIR Bilateral 2002   IR ANGIO INTRA EXTRACRAN SEL COM CAROTID INNOMINATE UNI L MOD SED  11/16/2020   IR CT HEAD LTD  11/13/2020   IR PERCUTANEOUS ART THROMBECTOMY/INFUSION INTRACRANIAL INC DIAG ANGIO  11/13/2020   OVARIAN CYST REMOVAL Left    PEG PLACEMENT N/A 12/01/2020   Procedure: PERCUTANEOUS ENDOSCOPIC GASTROSTOMY (PEG) PLACEMENT;  Surgeon: Diamantina Monks, MD;  Location: [facility] ENDOSCOPY;  Service: General;  Laterality: N/A;   PERIPHERAL VASCULAR CATHETERIZATION N/A 01/16/2015   Procedure: Abdominal Aortogram;  Surgeon: Chuck Hint, MD;  Location: [facility] INVASIVE CV LAB;  Service: Cardiovascular;  Laterality: N/A;   POSTERIOR LUMBAR FUSION  2015   "have plates and screws in there"   RADIOLOGY WITH ANESTHESIA N/A 11/13/2020   Procedure: IR WITH ANESTHESIA;  Surgeon: Radiologist, Medication, MD;  Location: [facility] OR;  Service: Radiology;  Laterality: N/A;   TONSILLECTOMY      Allergies  Allergen Reactions   Amoxicillin Other (See Comments)    UTI Has patient had a PCN reaction causing immediate rash, facial/tongue/throat swelling, SOB or lightheadedness with hypotension: No Has patient had a PCN reaction causing severe rash involving mucus membranes or skin necrosis: No Has patient had a PCN reaction  that required hospitalization: No Has patient had a PCN reaction occurring within the last 10 years: Yes--UTI ONLY If all of the above answers are "NO", then may proceed with Cephalosporin use.    Atenolol Cough   Crestor [Rosuvastatin Calcium] Other (See Comments)    Muscle aches   Pravastatin Other (See Comments)    Muscle aches   Rosuvastatin Calcium    Sulfa Antibiotics Nausea Only   Codeine Other (See Comments)    hallucinations    Outpatient Encounter Medications as of 12/31/2022  Medication Sig   acetaminophen (TYLENOL) 325 MG tablet Take 650 mg by mouth every 6 (six) hours as needed for mild pain or fever.   amiodarone (PACERONE) 200 MG tablet Take 200 mg by mouth daily. May give crushed   amLODipine (NORVASC) 5 MG tablet Take 5 mg by mouth daily.   apixaban (ELIQUIS) 5 MG TABS tablet Take 5 mg by mouth 2 (two) times daily. May give crushed   bisacodyl (DULCOLAX) 5 MG EC tablet Take 5 mg by mouth 2 (two) times daily as needed for moderate constipation.   buPROPion (WELLBUTRIN XL) 150 MG 24 hr tablet Take 150 mg by mouth daily.   diclofenac Sodium (  VOLTAREN) 1 % GEL Apply one application to right hip and knee four times daily as needed.   Emollient (CERAVE) CREA Apply 1 Application topically as needed.   ezetimibe (ZETIA) 10 MG tablet Take 10 mg by mouth daily. Oral if crushed   furosemide (LASIX) 20 MG tablet Take 20 mg by mouth daily as needed. Give Lasix 20mg  for weight gain equal or greater than 3 pounds   hydrALAZINE (APRESOLINE) 10 MG tablet Take 10 mg by mouth 3 (three) times daily as needed (SBP > 165 or DBP > 95).   hydrocortisone cream 1 % Apply 1 Application topically 2 (two) times daily as needed for itching.   ipratropium-albuterol (DUONEB) 0.5-2.5 (3) MG/3ML SOLN Inhale 3 mLs into the lungs every 4 (four) hours as needed.   ketoconazole (NIZORAL) 2 % cream Apply 1 application  topically daily as needed for irritation.   nystatin cream (MYCOSTATIN) Apply 1  application topically 2 (two) times daily as needed for dry skin (apply to perirectal rash).   ondansetron (ZOFRAN) 4 MG tablet Take 4 mg by mouth every 6 (six) hours as needed for nausea or vomiting.   OXYGEN Inhale 3 L into the lungs as directed. to maintain sats > or equal to 90%. May titrate Three Times A Day; 07:00 AM - 03:00 PM, 03:00 PM - 11:00 PM, 11:00 PM - 7:00 am   pantoprazole (PROTONIX) 40 MG tablet Take 40 mg by mouth 2 (two) times daily.   polyethylene glycol powder (GLYCOLAX/MIRALAX) 17 GM/SCOOP powder Take 17 g by mouth daily.   Tiotropium Bromide-Olodaterol (STIOLTO RESPIMAT) 2.5-2.5 MCG/ACT AERS Inhale 2 puffs into the lungs daily.   Facility-Administered Encounter Medications as of 12/31/2022  Medication   botulinum toxin Type A (BOTOX) injection 100 Units    Review of Systems  Constitutional:  Negative for activity change and appetite change.  HENT:  Negative for sore throat and trouble swallowing.   Eyes:  Positive for pain.       Right lower lid stye  Respiratory:  Negative for cough, shortness of breath and wheezing.   Cardiovascular:  Negative for chest pain and leg swelling.  Gastrointestinal:  Negative for abdominal distention and abdominal pain.  Genitourinary:  Negative for dysuria, frequency and vaginal bleeding.  Musculoskeletal:  Positive for gait problem.  Skin:  Negative for wound.  Neurological:  Positive for speech difficulty and weakness. Negative for dizziness and headaches.  Psychiatric/Behavioral:  Positive for dysphoric mood. Negative for confusion and sleep disturbance. The patient is not nervous/anxious.     Immunization History  Administered Date(s) Administered   Fluad Quad(high Dose 65+) 02/05/2022   Influenza Split 01/16/2009, 04/30/2010, 01/26/2016, 02/07/2018, 02/01/2020   Influenza, High Dose Seasonal PF 02/07/2018, 01/25/2019, 02/28/2021, 02/05/2022   Influenza,inj,Quad PF,6+ Mos 03/06/2012, 01/26/2016, 01/10/2017    Influenza,inj,quad, With Preservative 02/14/2015   Moderna Covid-19 Vaccine Bivalent Booster 62yrs & up 02/28/2021   PFIZER(Purple Top)SARS-COV-2 Vaccination 05/06/2019, 05/27/2019, 01/27/2020, 09/18/2020   Pneumococcal Conjugate-13 03/19/2017   Pneumococcal Polysaccharide-23 02/14/2015   Respiratory Syncytial Virus Vaccine,Recomb Aduvanted(Arexvy) 02/15/2022   Td 02/25/2005   Tdap 05/19/2013   Zoster, Live 07/28/2006   Pertinent  Health Maintenance Due  Topic Date Due   INFLUENZA VACCINE  11/14/2022   DEXA SCAN  Discontinued      07/08/2021    9:30 PM 07/09/2021    8:00 AM 05/07/2022   11:59 AM 08/13/2022   11:02 AM 11/26/2022   12:34 PM  Fall Risk  Falls in the past year?  0 1 0  Was there an injury with Fall?   0 0 0  Fall Risk Category Calculator   0 1 0  (RETIRED) Patient Fall Risk Level High fall risk Moderate fall risk     Patient at Risk for Falls Due to   No Fall Risks History of fall(s);Impaired balance/gait;Impaired mobility History of fall(s);Impaired balance/gait  Patient at Risk for Falls Due to - Comments    right sided hemiparesis   Fall risk Follow up   Falls evaluation completed Falls evaluation completed;Education provided;Falls prevention discussed Falls evaluation completed;Education provided   Functional Status Survey:    Vitals:   12/31/22 1419  BP: 98/63  Pulse: (!) 51  Resp: 16  Temp: (!) 97 F (36.1 C)  SpO2: 96%  Weight: 95 lb 14.4 oz (43.5 kg)  Height: 5\' 3"  (1.6 m)   Body mass index is 16.99 kg/m. Physical Exam Vitals reviewed.  Constitutional:      General: She is not in acute distress.    Appearance: She is underweight.  HENT:     Head: Normocephalic.  Eyes:     General:        Right eye: No discharge.        Left eye: No discharge.     Comments: Right lower lid stye present  Cardiovascular:     Rate and Rhythm: Normal rate. Rhythm irregular.     Pulses: Normal pulses.     Heart sounds: Normal heart sounds.  Pulmonary:      Effort: Pulmonary effort is normal. No respiratory distress.     Breath sounds: Normal breath sounds. No wheezing or rales.  Abdominal:     General: Bowel sounds are normal. There is no distension.     Palpations: Abdomen is soft.     Tenderness: There is no abdominal tenderness.  Musculoskeletal:     Cervical back: Neck supple.     Right lower leg: No edema.     Left lower leg: No edema.  Skin:    General: Skin is warm.     Capillary Refill: Capillary refill takes less than 2 seconds.  Neurological:     General: No focal deficit present.     Mental Status: She is alert. Mental status is at baseline.     Motor: Weakness present.     Gait: Gait abnormal.     Comments: Right sided weakness, aphasia  Psychiatric:        Mood and Affect: Mood normal.     Labs reviewed: Recent Labs    03/18/22 0500  NA 140  K 4.3  CL 106  CO2 26*  BUN 31*  CREATININE 0.9  CALCIUM 8.9   Recent Labs    03/18/22 0500  AST 33  ALT 39*  ALKPHOS 89  ALBUMIN 3.4*   Recent Labs    03/18/22 0500 09/19/22 0000  WBC 5.9 7.4  HGB 13.3 14.0  HCT 41 43  PLT 224 246   Lab Results  Component Value Date   TSH 0.61 08/08/2022   Lab Results  Component Value Date   HGBA1C 6.0 (H) 11/13/2020   Lab Results  Component Value Date   CHOL 182 11/22/2021   HDL 80 (A) 11/22/2021   LDLCALC 89 11/22/2021   TRIG 64 11/22/2021   CHOLHDL 3.0 11/13/2020    Significant Diagnostic Results in last 30 days:  No results found.  Assessment/Plan 1. Hordeolum externum of right lower eyelid - noted 09/10 per chart review -  has been using warm compress per nursing - cont warm compresses  - start erythromycin ointment 0.5% BID x 7 days  2. Pulmonary emphysema, unspecified emphysema type (HCC) - followed by pulmonary - cont Stiolto and duonebs  3. Chronic respiratory failure with hypoxia (HCC) - cont oxygen  4. 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 Essential hypertension - uncontrolled, SBP < 100 - decrease amlodipine to 2.5 mg po daily - check blood pressures BID x 7 days  6. Mixed hyperlipidemia - cont Zetia - intolerance to statins  7. Chronic diastolic congestive heart failure (HCC) - compensated - need September weight - cont furosemide prn  8. PAF (paroxysmal atrial fibrillation) (HCC) - HR< 100 with amiodarone - cont Eliquis for clot prevention  9. Moderate episode of recurrent major depressive disorder (HCC) - no mood changes - cont Wellbutrin   10. Gastroesophageal reflux disease without esophagitis - hgb stable - cont pantoprazole    Family/ staff Communication: plan discussed with patient and nurse  Labs/tests ordered:  blood pressures BID x 7 days

## 2023-01-01 ENCOUNTER — Telehealth: Payer: Self-pay | Admitting: Cardiology

## 2023-01-01 DIAGNOSIS — I69351 Hemiplegia and hemiparesis following cerebral infarction affecting right dominant side: Secondary | ICD-10-CM | POA: Diagnosis not present

## 2023-01-01 DIAGNOSIS — I5032 Chronic diastolic (congestive) heart failure: Secondary | ICD-10-CM | POA: Diagnosis not present

## 2023-01-01 DIAGNOSIS — R278 Other lack of coordination: Secondary | ICD-10-CM | POA: Diagnosis not present

## 2023-01-01 DIAGNOSIS — I63312 Cerebral infarction due to thrombosis of left middle cerebral artery: Secondary | ICD-10-CM | POA: Diagnosis not present

## 2023-01-01 DIAGNOSIS — M6389 Disorders of muscle in diseases classified elsewhere, multiple sites: Secondary | ICD-10-CM | POA: Diagnosis not present

## 2023-01-01 NOTE — Telephone Encounter (Signed)
Will forward to Dr Anne Fu for his review and any new orders

## 2023-01-01 NOTE — Telephone Encounter (Signed)
Pt c/o medication issue:  1. Name of Medication:   amLODipine (NORVASC) 5 MG tablet    2. How are you currently taking this medication (dosage and times per day)? Take 2.5 mg by mouth daily.   3. Are you having a reaction (difficulty breathing--STAT)? No  4. What is your medication issue? Facility called to make provider aware that NP for their facility deceased medication for pt to 2.5 mg daily due to BP. Please advise

## 2023-01-02 DIAGNOSIS — I5032 Chronic diastolic (congestive) heart failure: Secondary | ICD-10-CM | POA: Diagnosis not present

## 2023-01-02 DIAGNOSIS — J9611 Chronic respiratory failure with hypoxia: Secondary | ICD-10-CM | POA: Diagnosis not present

## 2023-01-02 DIAGNOSIS — R278 Other lack of coordination: Secondary | ICD-10-CM | POA: Diagnosis not present

## 2023-01-02 DIAGNOSIS — I69351 Hemiplegia and hemiparesis following cerebral infarction affecting right dominant side: Secondary | ICD-10-CM | POA: Diagnosis not present

## 2023-01-02 DIAGNOSIS — I63312 Cerebral infarction due to thrombosis of left middle cerebral artery: Secondary | ICD-10-CM | POA: Diagnosis not present

## 2023-01-02 NOTE — Telephone Encounter (Signed)
Spoke with Misty at [facility] who is aware OK per Dr Anne Fu to decrease amlodipine to 2.5 mg daily.  They will continue to monitor BP and notify the office if any concerns.

## 2023-01-03 DIAGNOSIS — M6389 Disorders of muscle in diseases classified elsewhere, multiple sites: Secondary | ICD-10-CM | POA: Diagnosis not present

## 2023-01-03 DIAGNOSIS — I63312 Cerebral infarction due to thrombosis of left middle cerebral artery: Secondary | ICD-10-CM | POA: Diagnosis not present

## 2023-01-03 DIAGNOSIS — I69351 Hemiplegia and hemiparesis following cerebral infarction affecting right dominant side: Secondary | ICD-10-CM | POA: Diagnosis not present

## 2023-01-03 DIAGNOSIS — R278 Other lack of coordination: Secondary | ICD-10-CM | POA: Diagnosis not present

## 2023-01-03 DIAGNOSIS — I5032 Chronic diastolic (congestive) heart failure: Secondary | ICD-10-CM | POA: Diagnosis not present

## 2023-01-06 DIAGNOSIS — I5032 Chronic diastolic (congestive) heart failure: Secondary | ICD-10-CM | POA: Diagnosis not present

## 2023-01-06 DIAGNOSIS — R278 Other lack of coordination: Secondary | ICD-10-CM | POA: Diagnosis not present

## 2023-01-06 DIAGNOSIS — M6389 Disorders of muscle in diseases classified elsewhere, multiple sites: Secondary | ICD-10-CM | POA: Diagnosis not present

## 2023-01-06 DIAGNOSIS — I63312 Cerebral infarction due to thrombosis of left middle cerebral artery: Secondary | ICD-10-CM | POA: Diagnosis not present

## 2023-01-06 DIAGNOSIS — I69351 Hemiplegia and hemiparesis following cerebral infarction affecting right dominant side: Secondary | ICD-10-CM | POA: Diagnosis not present

## 2023-01-08 ENCOUNTER — Telehealth: Payer: Self-pay | Admitting: Emergency Medicine

## 2023-01-08 ENCOUNTER — Encounter: Payer: Self-pay | Admitting: Emergency Medicine

## 2023-01-08 ENCOUNTER — Ambulatory Visit: Payer: Medicare Other | Admitting: Emergency Medicine

## 2023-01-08 VITALS — BP 112/66 | HR 52 | Ht 64.0 in | Wt 100.0 lb

## 2023-01-08 DIAGNOSIS — R0902 Hypoxemia: Secondary | ICD-10-CM

## 2023-01-08 DIAGNOSIS — J439 Emphysema, unspecified: Secondary | ICD-10-CM

## 2023-01-08 MED ORDER — ALBUTEROL SULFATE HFA 108 (90 BASE) MCG/ACT IN AERS: 2.0000 | INHALATION_SPRAY | Freq: Four times a day (QID) | RESPIRATORY_TRACT | 6 refills | Status: DC | PRN

## 2023-01-08 NOTE — Progress Notes (Signed)
Subjective:    Patient ID: Beverly Li, female    DOB: 07/26/1945, 77 y.o.   MRN: [id number]  HPI  ROV 02/05/22 --77 year old woman with a history of atrial fibrillation, GERD, PVD, hypertrophic obstructive cardiomyopathy.  She has been seen in our office for severe obstructive lung disease consistent with COPD.  I last saw her 3 years ago, last seen in our office 11/17/2021.  She has chronic bronchitic symptoms, has been seen and treated for exacerbations, suspected pneumonia on several occasions since I last saw her, most recently July 2023.  Currently managed on Stiolto, has DuoNeb which she uses very rarely.  She is on oxygen at 2-3 L/min. Today she reports that she is improving. There has been some question about possible intermittent aspiration. She is in a wheelchair, states that she feels like breathing is not limiting her. She works w PT 3x a week and can sometimes get SOB with that exercise. No cough.   ROV 01/08/2023 --77 year old woman, former smoker with a history of severe COPD.  Also with atrial fibrillation, GERD, PVD, hypertrophic obstructive cardiomyopathy.  She has a history of a prior CVA with some associated dysphagia.  I last saw her and in October 2023.  She has been managed on Stiolto and DuoNeb (never needs).  She has oxygen at 2-3 L/min, uses She has been having progressive exertional SOB, can be associated w wheeze. She has been working w PT at [facility]. She has some transient desat with exertion on 2L/min. She has also had some normal SpO2 on RA at rest. No sputum production.    Review of Systems  Constitutional:  Negative for fever and unexpected weight change.  HENT:  Negative for congestion, dental problem, ear pain, nosebleeds, postnasal drip, rhinorrhea, sinus pressure, sneezing, sore throat and trouble swallowing.   Eyes:  Negative for redness and itching.  Respiratory:  Positive for chest tightness and shortness of breath. Negative for cough and wheezing.    Cardiovascular:  Negative for palpitations and leg swelling.  Gastrointestinal:  Negative for nausea and vomiting.  Genitourinary:  Negative for dysuria.  Musculoskeletal:  Negative for joint swelling.  Skin:  Negative for rash.  Neurological:  Negative for headaches.  Hematological:  Does not bruise/bleed easily.  Psychiatric/Behavioral:  Negative for dysphoric mood. The patient is not nervous/anxious.        Objective:   Physical Exam Vitals:   01/08/23 1323  BP: 112/66  Pulse: (!) 52  SpO2: 95%  Weight: 100 lb (45.4 kg)  Height: 5\' 4"  (1.626 m)    Gen: Pleasant, thin woman, in no distress,  normal affect  ENT: No lesions,  mouth clear,  oropharynx clear, no postnasal drip  Neck: No JVD, no stridor  Lungs: No use of accessory muscles, clear on normal respiration.  Crackles at both bases, no wheezing  Cardiovascular: RRR, heart sounds normal, no murmur or gallops, no peripheral edema  Musculoskeletal: No deformities, no cyanosis or clubbing  Neuro: awake, some dysarthria, subtle facial droop.   Skin: Warm, no lesions or rashes      Assessment & Plan:  COPD (chronic obstructive pulmonary disease) (HCC) Please continue Stiolto 2 puffs once daily. Keep your DuoNeb available to use up to every 6 hours if needed for shortness of breath, chest tightness, wheezing. We will order an albuterol HFA inhaler for you.  You can use 2 puffs up to every 4 hours if needed for shortness of breath, chest tightness, wheezing. Flu shot today Follow  with APP in 6 months Follow Dr. Delton Coombes in 12 months or sooner if you have any problems.  Hypoxia Continue your oxygen at 0-1 L/min depending on your level of exertion.  Our goal is to keep your saturation > 90%.  You should continue to sleep with your oxygen on 2 L/min for now   Beverly Pupa, MD, PhD 01/08/2023, 1:53 PM [facility] [phone] or if no answer [phone]

## 2023-01-08 NOTE — Assessment & Plan Note (Signed)
Please continue Stiolto 2 puffs once daily. Keep your DuoNeb available to use up to every 6 hours if needed for shortness of breath, chest tightness, wheezing. We will order an albuterol HFA inhaler for you.  You can use 2 puffs up to every 4 hours if needed for shortness of breath, chest tightness, wheezing. Flu shot today Follow with APP in 6 months Follow Dr. Delton Coombes in 12 months or sooner if you have any problems.

## 2023-01-08 NOTE — Telephone Encounter (Signed)
Misty would like clarification of Albuterol medication. Misty phone number is [phone]. May speak to whomever is on duty.

## 2023-01-08 NOTE — Assessment & Plan Note (Signed)
Continue your oxygen at 0-1 L/min depending on your level of exertion.  Our goal is to keep your saturation > 90%.  You should continue to sleep with your oxygen on 2 L/min for now

## 2023-01-08 NOTE — Patient Instructions (Signed)
Please continue Stiolto 2 puffs once daily. Keep your DuoNeb available to use up to every 6 hours if needed for shortness of breath, chest tightness, wheezing. We will order an albuterol HFA inhaler for you.  You can use 2 puffs up to every 4 hours if needed for shortness of breath, chest tightness, wheezing. Continue your oxygen at 0-1 L/min depending on your level of exertion.  Our goal is to keep your saturation > 90%.  You should continue to sleep with your oxygen on 2 L/min for now Flu shot today Follow with APP in 6 months Follow Dr. Delton Coombes in 12 months or sooner if you have any problems.

## 2023-01-09 DIAGNOSIS — I69351 Hemiplegia and hemiparesis following cerebral infarction affecting right dominant side: Secondary | ICD-10-CM | POA: Diagnosis not present

## 2023-01-09 DIAGNOSIS — I63312 Cerebral infarction due to thrombosis of left middle cerebral artery: Secondary | ICD-10-CM | POA: Diagnosis not present

## 2023-01-09 DIAGNOSIS — I5032 Chronic diastolic (congestive) heart failure: Secondary | ICD-10-CM | POA: Diagnosis not present

## 2023-01-09 DIAGNOSIS — R278 Other lack of coordination: Secondary | ICD-10-CM | POA: Diagnosis not present

## 2023-01-09 DIAGNOSIS — J9611 Chronic respiratory failure with hypoxia: Secondary | ICD-10-CM | POA: Diagnosis not present

## 2023-01-09 NOTE — Telephone Encounter (Signed)
Misty checking on message for medication. Misty phone number is [phone]. Misty called during answering service.

## 2023-01-09 NOTE — Telephone Encounter (Signed)
I spoke with RN at [facility]. She needed to know the dosage on the Albuterol inhaler. I told her he ordered Albuterol 108 MCG/ ACT inhaler.   Nothing further needed.

## 2023-01-10 DIAGNOSIS — M6389 Disorders of muscle in diseases classified elsewhere, multiple sites: Secondary | ICD-10-CM | POA: Diagnosis not present

## 2023-01-10 DIAGNOSIS — I5032 Chronic diastolic (congestive) heart failure: Secondary | ICD-10-CM | POA: Diagnosis not present

## 2023-01-10 DIAGNOSIS — R278 Other lack of coordination: Secondary | ICD-10-CM | POA: Diagnosis not present

## 2023-01-10 DIAGNOSIS — I63312 Cerebral infarction due to thrombosis of left middle cerebral artery: Secondary | ICD-10-CM | POA: Diagnosis not present

## 2023-01-10 DIAGNOSIS — I69351 Hemiplegia and hemiparesis following cerebral infarction affecting right dominant side: Secondary | ICD-10-CM | POA: Diagnosis not present

## 2023-01-13 DIAGNOSIS — I63312 Cerebral infarction due to thrombosis of left middle cerebral artery: Secondary | ICD-10-CM | POA: Diagnosis not present

## 2023-01-13 DIAGNOSIS — I69351 Hemiplegia and hemiparesis following cerebral infarction affecting right dominant side: Secondary | ICD-10-CM | POA: Diagnosis not present

## 2023-01-13 DIAGNOSIS — M6389 Disorders of muscle in diseases classified elsewhere, multiple sites: Secondary | ICD-10-CM | POA: Diagnosis not present

## 2023-01-13 DIAGNOSIS — I5032 Chronic diastolic (congestive) heart failure: Secondary | ICD-10-CM | POA: Diagnosis not present

## 2023-01-13 DIAGNOSIS — R278 Other lack of coordination: Secondary | ICD-10-CM | POA: Diagnosis not present

## 2023-01-14 DIAGNOSIS — I63312 Cerebral infarction due to thrombosis of left middle cerebral artery: Secondary | ICD-10-CM | POA: Diagnosis not present

## 2023-01-14 DIAGNOSIS — I69351 Hemiplegia and hemiparesis following cerebral infarction affecting right dominant side: Secondary | ICD-10-CM | POA: Diagnosis not present

## 2023-01-14 DIAGNOSIS — R278 Other lack of coordination: Secondary | ICD-10-CM | POA: Diagnosis not present

## 2023-01-14 DIAGNOSIS — M6389 Disorders of muscle in diseases classified elsewhere, multiple sites: Secondary | ICD-10-CM | POA: Diagnosis not present

## 2023-01-14 DIAGNOSIS — I5032 Chronic diastolic (congestive) heart failure: Secondary | ICD-10-CM | POA: Diagnosis not present

## 2023-01-14 DIAGNOSIS — R293 Abnormal posture: Secondary | ICD-10-CM | POA: Diagnosis not present

## 2023-01-15 DIAGNOSIS — M6389 Disorders of muscle in diseases classified elsewhere, multiple sites: Secondary | ICD-10-CM | POA: Diagnosis not present

## 2023-01-15 DIAGNOSIS — I63312 Cerebral infarction due to thrombosis of left middle cerebral artery: Secondary | ICD-10-CM | POA: Diagnosis not present

## 2023-01-15 DIAGNOSIS — I69351 Hemiplegia and hemiparesis following cerebral infarction affecting right dominant side: Secondary | ICD-10-CM | POA: Diagnosis not present

## 2023-01-15 DIAGNOSIS — R293 Abnormal posture: Secondary | ICD-10-CM | POA: Diagnosis not present

## 2023-01-15 DIAGNOSIS — R278 Other lack of coordination: Secondary | ICD-10-CM | POA: Diagnosis not present

## 2023-01-16 DIAGNOSIS — J9611 Chronic respiratory failure with hypoxia: Secondary | ICD-10-CM | POA: Diagnosis not present

## 2023-01-16 DIAGNOSIS — I69351 Hemiplegia and hemiparesis following cerebral infarction affecting right dominant side: Secondary | ICD-10-CM | POA: Diagnosis not present

## 2023-01-16 DIAGNOSIS — I5032 Chronic diastolic (congestive) heart failure: Secondary | ICD-10-CM | POA: Diagnosis not present

## 2023-01-16 DIAGNOSIS — I63312 Cerebral infarction due to thrombosis of left middle cerebral artery: Secondary | ICD-10-CM | POA: Diagnosis not present

## 2023-01-16 DIAGNOSIS — R278 Other lack of coordination: Secondary | ICD-10-CM | POA: Diagnosis not present

## 2023-01-27 ENCOUNTER — Non-Acute Institutional Stay (SKILLED_NURSING_FACILITY): Payer: Medicare Other | Admitting: Internal Medicine

## 2023-01-27 ENCOUNTER — Encounter: Payer: Self-pay | Admitting: Internal Medicine

## 2023-01-27 DIAGNOSIS — K219 Gastro-esophageal reflux disease without esophagitis: Secondary | ICD-10-CM

## 2023-01-27 DIAGNOSIS — G8191 Hemiplegia, unspecified affecting right dominant side: Secondary | ICD-10-CM | POA: Diagnosis not present

## 2023-01-27 DIAGNOSIS — I480 Paroxysmal atrial fibrillation: Secondary | ICD-10-CM | POA: Diagnosis not present

## 2023-01-27 DIAGNOSIS — E782 Mixed hyperlipidemia: Secondary | ICD-10-CM

## 2023-01-27 DIAGNOSIS — J439 Emphysema, unspecified: Secondary | ICD-10-CM | POA: Diagnosis not present

## 2023-01-27 DIAGNOSIS — I10 Essential (primary) hypertension: Secondary | ICD-10-CM

## 2023-01-27 DIAGNOSIS — I5032 Chronic diastolic (congestive) heart failure: Secondary | ICD-10-CM | POA: Diagnosis not present

## 2023-01-27 DIAGNOSIS — F331 Major depressive disorder, recurrent, moderate: Secondary | ICD-10-CM

## 2023-01-27 NOTE — Progress Notes (Signed)
Location:  [facility] Room Number: 116A Place of Service:  SNF [phone]) Provider:  [name], MD   [name], MD  Patient Care Team: [name], MD as PCP - General (Internal Medicine) [name], MD as PCP - Cardiology (Cardiology) Duke Salvia, MD as PCP - Electrophysiology (Cardiology) Shirlean Kelly, MD as Consulting Physician (Neurosurgery)  Extended Emergency Contact Information Primary Emergency Contact: Pelley,[name] Address: [address]          [place] [phone] [name] Home Phone: [phone] Mobile Phone: [phone] Relation: Spouse Secondary Emergency Contact: Barocio,[name] Address: [address]          [place], [place] [address] [name] Mobile Phone: [phone] Relation: Son  Code Status:  full code  Goals of care: Advanced Directive information    01/27/2023   11:00 AM  Advanced Directives  Does Patient Have a Medical Advance Directive? Yes  Type of Advance Directive Healthcare Power of Attorney  Does patient want to make changes to medical advance directive? No - Patient declined  Copy of Healthcare Power of Attorney in Chart? Yes - validated most recent copy scanned in chart (See row information)     Chief Complaint  Patient presents with   Medical Management of Chronic Issues    Patient is being seen for routine visit    Immunizations    Patient is due for covid, flu and shingles vaccine     HPI:  Pt is a 77 y.o. female seen today for medical management of chronic diseases.    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 was 77 yrs old, when under stress the left side of face will droop.   Complication of anesthesia    "vascular OR 2016; BP bottomed out; couldn't get it regulated; ended up in ICU for DAYS" (11/18/2017)   GERD (gastroesophageal reflux disease)    History of kidney stones    Hypertension    Hypertrophic cardiomyopathy (HCC)    severe LV basilar hypertrophy witn no evidence of significant outflow tract obstruction, EF 65-70%, mild LAE, mild TR, grade 1a diastolic dysfunction 05/15/10 (Dr. Donato Schultz) (Atrial Septal Hypertrophy pattern)-- Intra-op TEE with dsignificant outflow tract obstruction - AI, MR & TR   Insomnia    Mild aortic sclerosis    Osteopenia    Peripheral vascular disease (HCC)    Syncope    , Vagal   Past Surgical History:  Procedure Laterality Date   AUGMENTATION MAMMAPLASTY Bilateral    BACK SURGERY     CARDIAC CATHETERIZATION N/A 05/07/2016   Procedure:  Left Heart Cath and Coronary Angiography;  Surgeon: Kathleene Hazel, MD;  Location: [facility] INVASIVE CV LAB;  Service: Cardiovascular;  Laterality: N/A;   CARDIOVERSION N/A 09/24/2017   Procedure: CARDIOVERSION;  Surgeon: Laurey Morale, MD;  Location: [facility] ENDOSCOPY;  Service: Cardiovascular;  Laterality: N/A;   DILATION AND CURETTAGE OF UTERUS     ENDARTERECTOMY FEMORAL Right 03/02/2015   Procedure: ENDARTERECTOMY RIGHT FEMORAL;  Surgeon: Chuck Hint, MD;  Location: [facility] OR;  Service: Vascular;  Laterality: Right;   ESOPHAGOGASTRODUODENOSCOPY (EGD) WITH PROPOFOL N/A 12/01/2020   Procedure: ESOPHAGOGASTRODUODENOSCOPY (EGD) WITH PROPOFOL;  Surgeon: Diamantina Monks, MD;   Location: [facility] ENDOSCOPY;  Service: General;  Laterality: N/A;   FACIAL COSMETIC SURGERY Left 2002   "related to Bell's Palsy @ age 24; left eye/side of face droopy; tried to make area symmetrical"   FEMORAL-POPLITEAL BYPASS GRAFT Right 03/02/2015   Procedure: BYPASS GRAFT FEMORAL-BELOW KNEE POPLITEAL ARTERY;  Surgeon: Chuck Hint, MD;  Location: [facility] OR;  Service: Vascular;  Laterality: Right;   INGUINAL HERNIA REPAIR Bilateral 2002   IR ANGIO INTRA EXTRACRAN SEL COM CAROTID INNOMINATE UNI L MOD SED  11/16/2020   IR CT HEAD LTD  11/13/2020   IR PERCUTANEOUS ART THROMBECTOMY/INFUSION INTRACRANIAL INC DIAG ANGIO  11/13/2020   OVARIAN CYST REMOVAL Left    PEG PLACEMENT N/A 12/01/2020   Procedure: PERCUTANEOUS ENDOSCOPIC GASTROSTOMY (PEG) PLACEMENT;  Surgeon: Diamantina Monks, MD;  Location: [facility] ENDOSCOPY;  Service: General;  Laterality: N/A;   PERIPHERAL VASCULAR CATHETERIZATION N/A 01/16/2015   Procedure: Abdominal Aortogram;  Surgeon: Chuck Hint, MD;  Location: [facility] INVASIVE CV LAB;  Service: Cardiovascular;  Laterality: N/A;   POSTERIOR LUMBAR FUSION  2015   "have plates and screws in there"   RADIOLOGY WITH ANESTHESIA N/A 11/13/2020   Procedure: IR WITH ANESTHESIA;  Surgeon: Radiologist, Medication, MD;  Location: [facility] OR;  Service: Radiology;  Laterality: N/A;   TONSILLECTOMY      Allergies  Allergen Reactions   Amoxicillin Other (See Comments)    UTI Has patient had a PCN reaction causing immediate rash, facial/tongue/throat swelling, SOB or lightheadedness with hypotension: No Has patient had a PCN reaction causing severe rash involving mucus membranes or skin necrosis: No Has patient had a PCN reaction that required hospitalization: No Has patient had a PCN reaction occurring within the last 10 years: Yes--UTI ONLY If all of the above answers are "NO", then may proceed with Cephalosporin use.    Atenolol Cough   Crestor [Rosuvastatin Calcium] Other (See Comments)    Muscle  aches   Pravastatin Other (See Comments)    Muscle aches   Rosuvastatin Calcium    Sulfa Antibiotics Nausea Only   Codeine Other (See Comments)    hallucinations    Outpatient Encounter Medications as of 01/27/2023  Medication Sig   acetaminophen (TYLENOL) 325 MG tablet Take 650 mg by mouth every 6 (six) hours as needed for mild pain or fever.   albuterol (VENTOLIN HFA) 108 (90 Base) MCG/ACT inhaler Inhale 2 puffs into the lungs every 6 (six) hours as needed for wheezing or shortness of breath.   amiodarone (PACERONE) 200 MG tablet Take 200 mg by mouth daily. May give crushed   amLODipine (NORVASC) 5 MG tablet Take 2.5 mg by mouth daily.   apixaban (ELIQUIS) 5 MG TABS tablet Take 5 mg by mouth 2 (two) times daily. May give crushed   bisacodyl (DULCOLAX) 5 MG EC tablet Take 5 mg by mouth 2 (two)  times daily as needed for moderate constipation.   buPROPion (WELLBUTRIN XL) 150 MG 24 hr tablet Take 150 mg by mouth daily.   diclofenac Sodium (VOLTAREN) 1 % GEL Apply one application to right hip and knee four times daily as needed.   Emollient (CERAVE) CREA Apply 1 Application topically as needed.   ezetimibe (ZETIA) 10 MG tablet Take 10 mg by mouth daily. Oral if crushed   furosemide (LASIX) 20 MG tablet Take 20 mg by mouth daily as needed. Give Lasix 20mg  for weight gain equal or greater than 3 pounds   hydrALAZINE (APRESOLINE) 10 MG tablet Take 10 mg by mouth 3 (three) times daily as needed (SBP > 165 or DBP > 95).   hydrocortisone cream 1 % Apply 1 Application topically 2 (two) times daily as needed for itching.   ipratropium-albuterol (DUONEB) 0.5-2.5 (3) MG/3ML SOLN Inhale 3 mLs into the lungs every 4 (four) hours as needed.   ketoconazole (NIZORAL) 2 % cream Apply 1 application  topically daily as needed for irritation.   nystatin cream (MYCOSTATIN) Apply 1 application topically 2 (two) times daily as needed for dry skin (apply to perirectal rash).   ondansetron (ZOFRAN) 4 MG tablet Take  4 mg by mouth every 6 (six) hours as needed for nausea or vomiting.   OXYGEN Inhale 3 L into the lungs as directed. to maintain sats > or equal to 90%. May titrate Three Times A Day; 07:00 AM - 03:00 PM, 03:00 PM - 11:00 PM, 11:00 PM - 7:00 am   pantoprazole (PROTONIX) 40 MG tablet Take 40 mg by mouth 2 (two) times daily.   Polyethyl Glycol-Propyl Glycol (SYSTANE) 0.4-0.3 % SOLN Apply 2 drops to eye every 6 (six) hours as needed (dry eyes).   polyethylene glycol powder (GLYCOLAX/MIRALAX) 17 GM/SCOOP powder Take 17 g by mouth daily.   sennosides-docusate sodium (SENOKOT-S) 8.6-50 MG tablet Take 1 tablet by mouth daily.   Tiotropium Bromide-Olodaterol (STIOLTO RESPIMAT) 2.5-2.5 MCG/ACT AERS Inhale 2 puffs into the lungs daily.   Facility-Administered Encounter Medications as of 01/27/2023  Medication   botulinum toxin Type A (BOTOX) injection 100 Units    Review of Systems  Constitutional:  Negative for activity change and appetite change.  HENT: Negative.    Respiratory:  Negative for cough and shortness of breath.   Cardiovascular:  Negative for leg swelling.  Gastrointestinal:  Negative for constipation.  Genitourinary: Negative.   Musculoskeletal:  Positive for gait problem. Negative for arthralgias and myalgias.  Skin: Negative.   Neurological:  Positive for speech difficulty. Negative for dizziness and weakness.  Psychiatric/Behavioral:  Negative for confusion, dysphoric mood and sleep disturbance.     Immunization History  Administered Date(s) Administered   Fluad Quad(high Dose 65+) 02/05/2022   Influenza Split 01/16/2009, 04/30/2010, 01/26/2016, 02/07/2018, 02/01/2020   Influenza, High Dose Seasonal PF 02/07/2018, 01/25/2019, 02/28/2021, 02/05/2022   Influenza,inj,Quad PF,6+ Mos 03/06/2012, 01/26/2016, 01/10/2017   Influenza,inj,quad, With Preservative 02/14/2015   Moderna Covid-19 Vaccine Bivalent Booster 24yrs & up 02/28/2021   PFIZER(Purple Top)SARS-COV-2 Vaccination  05/06/2019, 05/27/2019, 01/27/2020, 09/18/2020   Pneumococcal Conjugate-13 03/19/2017   Pneumococcal Polysaccharide-23 02/14/2015   Respiratory Syncytial Virus Vaccine,Recomb Aduvanted(Arexvy) 02/15/2022   Td 02/25/2005   Tdap 05/19/2013   Zoster, Live 07/28/2006   Pertinent  Health Maintenance Due  Topic Date Due   INFLUENZA VACCINE  11/14/2022   DEXA SCAN  Discontinued      07/09/2021    8:00 AM 05/07/2022   11:59 AM 08/13/2022   11:02  AM 11/26/2022   12:34 PM 12/31/2022    3:26 PM  Fall Risk  Falls in the past year?  0 1 0 0  Was there an injury with Fall?  0 0 0 0  Fall Risk Category Calculator  0 1 0 0  (RETIRED) Patient Fall Risk Level Moderate fall risk      Patient at Risk for Falls Due to  No Fall Risks History of fall(s);Impaired balance/gait;Impaired mobility History of fall(s);Impaired balance/gait History of fall(s);Impaired balance/gait;Impaired mobility  Patient at Risk for Falls Due to - Comments   right sided hemiparesis    Fall risk Follow up  Falls evaluation completed Falls evaluation completed;Education provided;Falls prevention discussed Falls evaluation completed;Education provided Falls evaluation completed;Education provided;Falls prevention discussed   Functional Status Survey:    Vitals:   01/27/23 1055  BP: 118/67  Pulse: (!) 56  Resp: 16  Temp: (!) 97.3 F (36.3 C)  TempSrc: Temporal  SpO2: 93%  Weight: 93 lb 3.2 oz (42.3 kg)  Height: 5\' 4"  (1.626 m)   Body mass index is 16 kg/m. Physical Exam Vitals reviewed.  Constitutional:      Appearance: Normal appearance.  HENT:     Head: Normocephalic.     Nose: Nose normal.     Mouth/Throat:     Mouth: Mucous membranes are moist.     Pharynx: Oropharynx is clear.  Eyes:     Pupils: Pupils are equal, round, and reactive to light.  Cardiovascular:     Rate and Rhythm: Normal rate and regular rhythm.     Pulses: Normal pulses.     Heart sounds: Normal heart sounds. No murmur heard. Pulmonary:      Effort: Pulmonary effort is normal.     Breath sounds: Normal breath sounds.  Abdominal:     General: Abdomen is flat. Bowel sounds are normal.     Palpations: Abdomen is soft.  Musculoskeletal:        General: No swelling.     Cervical back: Neck supple.  Skin:    General: Skin is warm.  Neurological:     Mental Status: She is alert and oriented to person, place, and time.     Comments: Right hemiparesis Doing better with her LE Still has contracture of UE  Psychiatric:        Mood and Affect: Mood normal.        Thought Content: Thought content normal.     Labs reviewed: Recent Labs    03/18/22 0500  NA 140  K 4.3  CL 106  CO2 26*  BUN 31*  CREATININE 0.9  CALCIUM 8.9   Recent Labs    03/18/22 0500  AST 33  ALT 39*  ALKPHOS 89  ALBUMIN 3.4*   Recent Labs    03/18/22 0500 09/19/22 0000  WBC 5.9 7.4  HGB 13.3 14.0  HCT 41 43  PLT 224 246   Lab Results  Component Value Date   TSH 0.61 08/08/2022   Lab Results  Component Value Date   HGBA1C 6.0 (H) 11/13/2020   Lab Results  Component Value Date   CHOL 182 11/22/2021   HDL 80 (A) 11/22/2021   LDLCALC 89 11/22/2021   TRIG 64 11/22/2021   CHOLHDL 3.0 11/13/2020    Significant Diagnostic Results in last 30 days:  No results found.  Assessment/Plan 1. Pulmonary emphysema, unspecified emphysema type (HCC) Oxygen Proair PRN On Stiolto Follows with Pulmonary 2. Right hemiplegia (HCC) Works with therapy Botox  per Dr Terrace Arabia  3. Essential hypertension Norvasc was reduced due to Low BP Which is good now  4. Mixed hyperlipidemia Zetia Allergic to statin  5. PAF (paroxysmal atrial fibrillation) (HCC) Eliquis and Amiodarone  6. Chronic diastolic congestive heart failure (HCC) Lasix PRN  7. Moderate episode of recurrent major depressive disorder (HCC) Wellbutrin  8. Gastroesophageal reflux disease without esophagitis Protonix    Family/ staff Communication:   Labs/tests ordered:

## 2023-01-29 DIAGNOSIS — R278 Other lack of coordination: Secondary | ICD-10-CM | POA: Diagnosis not present

## 2023-01-29 DIAGNOSIS — M6389 Disorders of muscle in diseases classified elsewhere, multiple sites: Secondary | ICD-10-CM | POA: Diagnosis not present

## 2023-01-29 DIAGNOSIS — I69351 Hemiplegia and hemiparesis following cerebral infarction affecting right dominant side: Secondary | ICD-10-CM | POA: Diagnosis not present

## 2023-01-29 DIAGNOSIS — R293 Abnormal posture: Secondary | ICD-10-CM | POA: Diagnosis not present

## 2023-01-29 DIAGNOSIS — I63312 Cerebral infarction due to thrombosis of left middle cerebral artery: Secondary | ICD-10-CM | POA: Diagnosis not present

## 2023-02-05 DIAGNOSIS — M6389 Disorders of muscle in diseases classified elsewhere, multiple sites: Secondary | ICD-10-CM | POA: Diagnosis not present

## 2023-02-05 DIAGNOSIS — I63312 Cerebral infarction due to thrombosis of left middle cerebral artery: Secondary | ICD-10-CM | POA: Diagnosis not present

## 2023-02-05 DIAGNOSIS — R293 Abnormal posture: Secondary | ICD-10-CM | POA: Diagnosis not present

## 2023-02-05 DIAGNOSIS — I69351 Hemiplegia and hemiparesis following cerebral infarction affecting right dominant side: Secondary | ICD-10-CM | POA: Diagnosis not present

## 2023-02-05 DIAGNOSIS — R278 Other lack of coordination: Secondary | ICD-10-CM | POA: Diagnosis not present

## 2023-02-06 DIAGNOSIS — I69351 Hemiplegia and hemiparesis following cerebral infarction affecting right dominant side: Secondary | ICD-10-CM | POA: Diagnosis not present

## 2023-02-06 DIAGNOSIS — J9611 Chronic respiratory failure with hypoxia: Secondary | ICD-10-CM | POA: Diagnosis not present

## 2023-02-06 DIAGNOSIS — I5032 Chronic diastolic (congestive) heart failure: Secondary | ICD-10-CM | POA: Diagnosis not present

## 2023-02-06 DIAGNOSIS — I63312 Cerebral infarction due to thrombosis of left middle cerebral artery: Secondary | ICD-10-CM | POA: Diagnosis not present

## 2023-02-06 DIAGNOSIS — R278 Other lack of coordination: Secondary | ICD-10-CM | POA: Diagnosis not present

## 2023-02-07 DIAGNOSIS — R293 Abnormal posture: Secondary | ICD-10-CM | POA: Diagnosis not present

## 2023-02-07 DIAGNOSIS — M6389 Disorders of muscle in diseases classified elsewhere, multiple sites: Secondary | ICD-10-CM | POA: Diagnosis not present

## 2023-02-07 DIAGNOSIS — R278 Other lack of coordination: Secondary | ICD-10-CM | POA: Diagnosis not present

## 2023-02-07 DIAGNOSIS — I63312 Cerebral infarction due to thrombosis of left middle cerebral artery: Secondary | ICD-10-CM | POA: Diagnosis not present

## 2023-02-07 DIAGNOSIS — I69351 Hemiplegia and hemiparesis following cerebral infarction affecting right dominant side: Secondary | ICD-10-CM | POA: Diagnosis not present

## 2023-02-13 DIAGNOSIS — R278 Other lack of coordination: Secondary | ICD-10-CM | POA: Diagnosis not present

## 2023-02-13 DIAGNOSIS — I5032 Chronic diastolic (congestive) heart failure: Secondary | ICD-10-CM | POA: Diagnosis not present

## 2023-02-13 DIAGNOSIS — J9611 Chronic respiratory failure with hypoxia: Secondary | ICD-10-CM | POA: Diagnosis not present

## 2023-02-13 DIAGNOSIS — I63312 Cerebral infarction due to thrombosis of left middle cerebral artery: Secondary | ICD-10-CM | POA: Diagnosis not present

## 2023-02-13 DIAGNOSIS — I69351 Hemiplegia and hemiparesis following cerebral infarction affecting right dominant side: Secondary | ICD-10-CM | POA: Diagnosis not present

## 2023-02-19 DIAGNOSIS — I63312 Cerebral infarction due to thrombosis of left middle cerebral artery: Secondary | ICD-10-CM | POA: Diagnosis not present

## 2023-02-19 DIAGNOSIS — I69351 Hemiplegia and hemiparesis following cerebral infarction affecting right dominant side: Secondary | ICD-10-CM | POA: Diagnosis not present

## 2023-02-19 DIAGNOSIS — I5032 Chronic diastolic (congestive) heart failure: Secondary | ICD-10-CM | POA: Diagnosis not present

## 2023-02-19 DIAGNOSIS — M6389 Disorders of muscle in diseases classified elsewhere, multiple sites: Secondary | ICD-10-CM | POA: Diagnosis not present

## 2023-02-19 DIAGNOSIS — R278 Other lack of coordination: Secondary | ICD-10-CM | POA: Diagnosis not present

## 2023-02-19 DIAGNOSIS — R293 Abnormal posture: Secondary | ICD-10-CM | POA: Diagnosis not present

## 2023-02-21 DIAGNOSIS — M6389 Disorders of muscle in diseases classified elsewhere, multiple sites: Secondary | ICD-10-CM | POA: Diagnosis not present

## 2023-02-21 DIAGNOSIS — I5032 Chronic diastolic (congestive) heart failure: Secondary | ICD-10-CM | POA: Diagnosis not present

## 2023-02-21 DIAGNOSIS — R293 Abnormal posture: Secondary | ICD-10-CM | POA: Diagnosis not present

## 2023-02-21 DIAGNOSIS — R278 Other lack of coordination: Secondary | ICD-10-CM | POA: Diagnosis not present

## 2023-02-21 DIAGNOSIS — I63312 Cerebral infarction due to thrombosis of left middle cerebral artery: Secondary | ICD-10-CM | POA: Diagnosis not present

## 2023-02-21 DIAGNOSIS — I69351 Hemiplegia and hemiparesis following cerebral infarction affecting right dominant side: Secondary | ICD-10-CM | POA: Diagnosis not present

## 2023-02-26 ENCOUNTER — Ambulatory Visit: Payer: Medicare Other | Admitting: Neurology

## 2023-02-26 VITALS — BP 88/60 | HR 49

## 2023-02-26 DIAGNOSIS — G8191 Hemiplegia, unspecified affecting right dominant side: Secondary | ICD-10-CM

## 2023-02-26 DIAGNOSIS — G8111 Spastic hemiplegia affecting right dominant side: Secondary | ICD-10-CM

## 2023-02-26 MED ORDER — ONABOTULINUMTOXINA 200 UNITS IJ SOLR
200.0000 [IU] | Freq: Once | INTRAMUSCULAR | Status: AC
Start: 2023-02-26 — End: 2023-02-26
  Administered 2023-02-26: 200 [IU] via INTRAMUSCULAR

## 2023-02-26 NOTE — Progress Notes (Signed)
Botox- 100 units x 2 vial Lot: [id number] Expiration: 03.2027 NDC: 0023-3921-02  Bacteriostatic 0.9% Sodium Chloride- 4 mL  Lot: [id number] Expiration: 04.01.2025 NDC: [id number]  Dx: G81.91 B/B Witnessed by April, RN

## 2023-02-26 NOTE — Progress Notes (Signed)
Chief Complaint  Patient presents with   Injections    Rm14,  Pt well and ready for injection      ASSESSMENT AND PLAN  Beverly Li is a 77 y.o. 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  Stroke happened while she was off Eliquis for 1 week for abdominal hematoma, with history of atrial fibrillation  Now with residual aphasia, right hemiparesis,  Electrical stimulation guided Botox a injection for spastic right upper extremity (100 units of Botox a dissolving to 2 cc of normal saline), 1st injection was on May 22 2022  Right pectoralis major 50 units Right latissimus dorsi 50 units Right teres major 25 units Right flexor digitorum profundus 25  units Right pronator teres 25 units Right brachialis 25  units    DIAGNOSTIC DATA (LABS, IMAGING, TESTING) - I reviewed patient records, labs, notes, testing and imaging myself where available.   MEDICAL HISTORY:  Beverly Li is a 77 year old female, accompanied by her husband, seen in request by his primary care at [facility] Dr. Einar Crow for evaluation of botulism toxin injection for spastic right hemiparesis, she is also seen by our clinic for stroke follow-up   I reviewed and summarized the referring note.PMHx. A fib,  Eliquis HTN Peripheral vascular disease. COPD   She suffered stroke on November 13, 2020 presenting with right-sided weakness, right facial droop, left gaze preference,  MRI showed left ACA/MCA stroke, status post IR with TICI, reperfusion of left MCA, in the setting of paroxysmal atrial fibrillation taking off Eliquis for 1 week due to abdominal hematoma  CT angiogram head and neck and four-vessel angiogram postprocedure showed status post revascularization of the occluded left middle cerebral artery M1 segment, and proximal flow arrest achieved TICI 3 revascularization, severe preocclusive stenosis of the proximal right internal carotid artery associated with a delayed strain signs,  Known  history of peripheral vascular disease, CT angiogram November 25, 2020 suspicious for occlusion of distal right femoral-popliteal bypass, low cardiac output, and distal high-grade stenosis/resistance also contributed to the unopacified distal bypass conduit, Absent of right side tibial arterial opacification, moderate to advanced aortic atherosclerosis bilateral iliac arterial disease, with no evidence of high-grade aortoiliac disease.  Echocardiogram in March 2023, ejection fraction 60 to 65%, no regional wall motion abnormality, moderate asymmetric left ventricular hypertrophy of the basal septal segment  She is now at [facility], with right hemiparesis, aphasia, husband reported that she continued to make small progress even now, physical therapy have suggested botulism toxin injection to alleviate her right shoulder and arm weakness   UPDATE May 22 2022: She is accompanied by her husband, receiving first electrical stimulation guided Botox injection for spastic right upper extremity, use of Botox a 100 units x 2, potential side effect explained,  UPDATE Aug 21 2022: She responded well to previous injection, there was no significant side effect noticed, tolerating Botox a 100 units x 2,  UPDATE August 14th 2024: She is accompanied by her husband at today's clinical visit, overall doing well, no side effect noted, every 3 months Botox injection has relaxed right shoulder, elbow,  UPDATE Feb 26 2023: Previous injection has been helpful, no side effect noted. PHYSICAL EXAM:   Vitals:   02/26/23 1324  BP: (!) 88/60  Pulse: (!) 49     There is no height or weight on file to calculate BMI.  PHYSICAL EXAMNIATION:  Gen: NAD, conversant, well nourised, well groomed  Cardiovascular: Regular rate rhythm, no peripheral edema, warm, nontender. Eyes: Conjunctivae clear without exudates or hemorrhage Neck: Supple, no carotid bruits. Pulmonary: Clear to auscultation  bilaterally   NEUROLOGICAL EXAM:  MENTAL STATUS: Speech/cognition: Global aphasia, paraphasic errors, slow in comprehension, word finding difficulties CRANIAL NERVES: CN II: Visual fields are full to confrontation. Pupils are round equal and briskly reactive to light. CN III, IV, VI: extraocular movement are normal. No ptosis. CN V: Facial sensation is intact to light touch CN VII: Right lower face weakness CN VIII: Hearing is normal to causal conversation. CN IX, X: Phonation is normal. CN XI: Head turning and shoulder shrug are intact  MOTOR: Right hemiparesis, mild spasticity of right upper extremity, no significant right upper extremity movement, good range of motion of right shoulder, elbow, tight right pectoralis muscle, latissimus dorsi, tendency for right thumb in  REVIEW OF SYSTEMS:  Full 14 system review of systems performed and notable only for as above All other review of systems were negative.   ALLERGIES: Allergies  Allergen Reactions   Amoxicillin Other (See Comments)    UTI Has patient had a PCN reaction causing immediate rash, facial/tongue/throat swelling, SOB or lightheadedness with hypotension: No Has patient had a PCN reaction causing severe rash involving mucus membranes or skin necrosis: No Has patient had a PCN reaction that required hospitalization: No Has patient had a PCN reaction occurring within the last 10 years: Yes--UTI ONLY If all of the above answers are "NO", then may proceed with Cephalosporin use.    Atenolol Cough   Crestor [Rosuvastatin Calcium] Other (See Comments)    Muscle aches   Pravastatin Other (See Comments)    Muscle aches   Rosuvastatin Calcium    Sulfa Antibiotics Nausea Only   Codeine Other (See Comments)    hallucinations    HOME MEDICATIONS: Current Outpatient Medications  Medication Sig Dispense Refill   acetaminophen (TYLENOL) 325 MG tablet Take 650 mg by mouth every 6 (six) hours as needed for mild pain or fever.      albuterol (VENTOLIN HFA) 108 (90 Base) MCG/ACT inhaler Inhale 2 puffs into the lungs every 6 (six) hours as needed for wheezing or shortness of breath. 8 g 6   amiodarone (PACERONE) 200 MG tablet Take 200 mg by mouth daily. May give crushed     amLODipine (NORVASC) 5 MG tablet Take 2.5 mg by mouth daily.     apixaban (ELIQUIS) 5 MG TABS tablet Take 5 mg by mouth 2 (two) times daily. May give crushed     bisacodyl (DULCOLAX) 5 MG EC tablet Take 5 mg by mouth 2 (two) times daily as needed for moderate constipation.     buPROPion (WELLBUTRIN XL) 150 MG 24 hr tablet Take 150 mg by mouth daily.     diclofenac Sodium (VOLTAREN) 1 % GEL Apply one application to right hip and knee four times daily as needed.     Emollient (CERAVE) CREA Apply 1 Application topically as needed.     ezetimibe (ZETIA) 10 MG tablet Take 10 mg by mouth daily. Oral if crushed     furosemide (LASIX) 20 MG tablet Take 20 mg by mouth daily as needed. Give Lasix 20mg  for weight gain equal or greater than 3 pounds     hydrALAZINE (APRESOLINE) 10 MG tablet Take 10 mg by mouth 3 (three) times daily as needed (SBP > 165 or DBP > 95).     hydrocortisone cream 1 % Apply 1 Application topically 2 (two) times  daily as needed for itching.     ipratropium-albuterol (DUONEB) 0.5-2.5 (3) MG/3ML SOLN Inhale 3 mLs into the lungs every 4 (four) hours as needed.     ketoconazole (NIZORAL) 2 % cream Apply 1 application  topically daily as needed for irritation.     nystatin cream (MYCOSTATIN) Apply 1 application topically 2 (two) times daily as needed for dry skin (apply to perirectal rash).     ondansetron (ZOFRAN) 4 MG tablet Take 4 mg by mouth every 6 (six) hours as needed for nausea or vomiting.     OXYGEN Inhale 3 L into the lungs as directed. to maintain sats > or equal to 90%. May titrate Three Times A Day; 07:00 AM - 03:00 PM, 03:00 PM - 11:00 PM, 11:00 PM - 7:00 am     pantoprazole (PROTONIX) 40 MG tablet Take 40 mg by mouth 2 (two) times  daily.     Polyethyl Glycol-Propyl Glycol (SYSTANE) 0.4-0.3 % SOLN Apply 2 drops to eye every 6 (six) hours as needed (dry eyes).     polyethylene glycol powder (GLYCOLAX/MIRALAX) 17 GM/SCOOP powder Take 17 g by mouth daily.     sennosides-docusate sodium (SENOKOT-S) 8.6-50 MG tablet Take 1 tablet by mouth daily.     Tiotropium Bromide-Olodaterol (STIOLTO RESPIMAT) 2.5-2.5 MCG/ACT AERS Inhale 2 puffs into the lungs daily.     Current Facility-Administered Medications  Medication Dose Route Frequency Provider Last Rate Last Admin   botulinum toxin Type A (BOTOX) injection 100 Units  100 Units Intramuscular Once Levert Feinstein, MD       botulinum toxin Type A (BOTOX) injection 200 Units  200 Units Intramuscular Once Levert Feinstein, MD        PAST MEDICAL HISTORY: Past Medical History:  Diagnosis Date   Anxiety    Arthritis    "some in my lower back; probably elbows, knees" (11/18/2017)   Atrial fibrillation (HCC)    Bell's palsy    when pt. was 77 yrs old, when under stress the left side of face will droop.   Complication of anesthesia    "vascular OR 2016; BP bottomed out; couldn't get it regulated; ended up in ICU for DAYS" (11/18/2017)   GERD (gastroesophageal reflux disease)    History of kidney stones    Hypertension    Hypertrophic cardiomyopathy (HCC)    severe LV basilar hypertrophy witn no evidence of significant outflow tract obstruction, EF 65-70%, mild LAE, mild TR, grade 1a diastolic dysfunction 05/15/10 (Dr. Donato Schultz) (Atrial Septal Hypertrophy pattern)-- Intra-op TEE with dsignificant outflow tract obstruction - AI, MR & TR   Insomnia    Mild aortic sclerosis    Osteopenia    Peripheral vascular disease (HCC)    Syncope    , Vagal    PAST SURGICAL HISTORY: Past Surgical History:  Procedure Laterality Date   AUGMENTATION MAMMAPLASTY Bilateral    BACK SURGERY     CARDIAC CATHETERIZATION N/A 05/07/2016   Procedure: Left Heart Cath and Coronary Angiography;  Surgeon:  Kathleene Hazel, MD;  Location: [facility] INVASIVE CV LAB;  Service: Cardiovascular;  Laterality: N/A;   CARDIOVERSION N/A 09/24/2017   Procedure: CARDIOVERSION;  Surgeon: Laurey Morale, MD;  Location: [facility] ENDOSCOPY;  Service: Cardiovascular;  Laterality: N/A;   DILATION AND CURETTAGE OF UTERUS     ENDARTERECTOMY FEMORAL Right 03/02/2015   Procedure: ENDARTERECTOMY RIGHT FEMORAL;  Surgeon: Chuck Hint, MD;  Location: [facility] OR;  Service: Vascular;  Laterality: Right;   ESOPHAGOGASTRODUODENOSCOPY (EGD) WITH PROPOFOL  N/A 12/01/2020   Procedure: ESOPHAGOGASTRODUODENOSCOPY (EGD) WITH PROPOFOL;  Surgeon: Diamantina Monks, MD;  Location: [facility] ENDOSCOPY;  Service: General;  Laterality: N/A;   FACIAL COSMETIC SURGERY Left 2002   "related to Bell's Palsy @ age 17; left eye/side of face droopy; tried to make area symmetrical"   FEMORAL-POPLITEAL BYPASS GRAFT Right 03/02/2015   Procedure: BYPASS GRAFT FEMORAL-BELOW KNEE POPLITEAL ARTERY;  Surgeon: Chuck Hint, MD;  Location: [facility] OR;  Service: Vascular;  Laterality: Right;   INGUINAL HERNIA REPAIR Bilateral 2002   IR ANGIO INTRA EXTRACRAN SEL COM CAROTID INNOMINATE UNI L MOD SED  11/16/2020   IR CT HEAD LTD  11/13/2020   IR PERCUTANEOUS ART THROMBECTOMY/INFUSION INTRACRANIAL INC DIAG ANGIO  11/13/2020   OVARIAN CYST REMOVAL Left    PEG PLACEMENT N/A 12/01/2020   Procedure: PERCUTANEOUS ENDOSCOPIC GASTROSTOMY (PEG) PLACEMENT;  Surgeon: Diamantina Monks, MD;  Location: [facility] ENDOSCOPY;  Service: General;  Laterality: N/A;   PERIPHERAL VASCULAR CATHETERIZATION N/A 01/16/2015   Procedure: Abdominal Aortogram;  Surgeon: Chuck Hint, MD;  Location: [facility] INVASIVE CV LAB;  Service: Cardiovascular;  Laterality: N/A;   POSTERIOR LUMBAR FUSION  2015   "have plates and screws in there"   RADIOLOGY WITH ANESTHESIA N/A 11/13/2020   Procedure: IR WITH ANESTHESIA;  Surgeon: Radiologist, Medication, MD;  Location: [facility] OR;  Service: Radiology;  Laterality: N/A;    TONSILLECTOMY      FAMILY HISTORY: Family History  Problem Relation Age of Onset   Liver cancer Mother    Cancer Mother        Liver   Hypertension Mother    Lung cancer Father    Cancer Father        Lung   Breast cancer Sister    Cancer Sister        Breast    SOCIAL HISTORY: Social History   Socioeconomic History   Marital status: Married    Spouse name: Optometrist   Number of children: Not on file   Years of education: Not on file   Highest education level: Not on file  Occupational History   Not on file  Tobacco Use   Smoking status: Former    Current packs/day: 0.00    Average packs/day: 1 pack/day for 50.0 years (50.0 ttl pk-yrs)    Types: Cigarettes    Start date: 12/14/1964    Quit date: 12/15/2014    Years since quitting: 8.2   Smokeless tobacco: Never  Vaping Use   Vaping status: Never Used  Substance and Sexual Activity   Alcohol use: Yes    Comment: 11/18/2017 "might have a couple glasses of wine/month; if that"   Drug use: Not on file   Sexual activity: Not Currently  Other Topics Concern   Not on file  Social History Narrative   11/21/21 living at [facility]   Social Determinants of Health   Financial Resource Strain: Low Risk  (08/13/2022)   Overall Financial Resource Strain (CARDIA)    Difficulty of Paying Living Expenses: Not hard at all  Food Insecurity: No Food Insecurity (08/13/2022)   Hunger Vital Sign    Worried About Running Out of Food in the Last Year: Never true    Ran Out of Food in the Last Year: Never true  Transportation Needs: No Transportation Needs (08/13/2022)   PRAPARE - Administrator, Civil Service (Medical): No    Lack of Transportation (Non-Medical): No  Physical Activity: Insufficiently Active (08/13/2022)  Exercise Vital Sign    Days of Exercise per Week: 3 days    Minutes of Exercise per Session: 30 min  Stress: No Stress Concern Present (08/13/2022)   [employer] of Occupational Health - Occupational  Stress Questionnaire    Feeling of Stress : Only a little  Social Connections: Moderately Isolated (08/13/2022)   Social Connection and Isolation Panel [NHANES]    Frequency of Communication with Friends and Family: Patient unable to answer    Frequency of Social Gatherings with Friends and Family: More than three times a week    Attends Religious Services: Never    Database administrator or Organizations: No    Attends Banker Meetings: Never    Marital Status: Married  Catering manager Violence: Not At Risk (08/13/2022)   Humiliation, Afraid, Rape, and Kick questionnaire    Fear of Current or Ex-Partner: No    Emotionally Abused: No    Physically Abused: No    Sexually Abused: No      Levert Feinstein, M.D. Ph.D.  [facility] [address] [place], [place] [address] Ph: [phone] Fax: [phone]  CC:  [name], MD [address] [place],  [place] [address]  [name], MD

## 2023-03-11 ENCOUNTER — Encounter: Payer: Self-pay | Admitting: Orthopedic Surgery

## 2023-03-11 ENCOUNTER — Non-Acute Institutional Stay (SKILLED_NURSING_FACILITY): Payer: Medicare Other | Admitting: Orthopedic Surgery

## 2023-03-11 DIAGNOSIS — I480 Paroxysmal atrial fibrillation: Secondary | ICD-10-CM

## 2023-03-11 DIAGNOSIS — I10 Essential (primary) hypertension: Secondary | ICD-10-CM

## 2023-03-11 DIAGNOSIS — J439 Emphysema, unspecified: Secondary | ICD-10-CM | POA: Diagnosis not present

## 2023-03-11 DIAGNOSIS — E782 Mixed hyperlipidemia: Secondary | ICD-10-CM

## 2023-03-11 DIAGNOSIS — K219 Gastro-esophageal reflux disease without esophagitis: Secondary | ICD-10-CM | POA: Diagnosis not present

## 2023-03-11 DIAGNOSIS — G8191 Hemiplegia, unspecified affecting right dominant side: Secondary | ICD-10-CM | POA: Diagnosis not present

## 2023-03-11 DIAGNOSIS — I5032 Chronic diastolic (congestive) heart failure: Secondary | ICD-10-CM | POA: Diagnosis not present

## 2023-03-11 DIAGNOSIS — J9611 Chronic respiratory failure with hypoxia: Secondary | ICD-10-CM

## 2023-03-11 DIAGNOSIS — F331 Major depressive disorder, recurrent, moderate: Secondary | ICD-10-CM

## 2023-03-11 NOTE — Progress Notes (Signed)
Location:  [facility] Room Number: 116/A Place of Service:  SNF [phone]) Provider:  Octavia Heir, NP   [name], MD  Patient Care Team: [name], MD as PCP - General (Internal Medicine) [name], MD as PCP - Cardiology (Cardiology) Duke Salvia, MD as PCP - Electrophysiology (Cardiology) Shirlean Kelly, MD as Consulting Physician (Neurosurgery)  Extended Emergency Contact Information Primary Emergency Contact: Sobczyk,[name] Address: [address]          [place] [phone] [name] Home Phone: [phone] Mobile Phone: [phone] Relation: Spouse Secondary Emergency Contact: Monnier,[name] Address: [address]          [place], [place] [address] [name] Mobile Phone: [phone] Relation: Son  Code Status:  DNR Goals of care: Advanced Directive information    01/27/2023   11:00 AM  Advanced Directives  Does Patient Have a Medical Advance Directive? Yes  Type of Advance Directive Healthcare Power of Attorney  Does patient want to make changes to medical advance directive? No - Patient declined  Copy of Healthcare Power of Attorney in Chart? Yes - validated most recent copy scanned in chart (See row information)     Chief Complaint  Patient presents with   Medical Management of Chronic Issues    HPI:  Pt is a 77 y.o. female seen today for medical management of chronic diseases.    She currently resides on the skilled nursing unit at [facility]. 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 Terrace Arabia last done 11/13 Pulmonary emphysema- followed by pulmonary, remains on Stioloto and duonebs prn Chronic respiratory  failure with hypoxia- now on 2-3 liters continuous oxygen HTN- BUN/creat 24.9/0.75 09/19/2022, see trends below, remains on amlodipine HLD- total 185, LDL 93, statin intolerance, remains on Zetia CHF- 2D echo LVEF 60-65% 06/2021, remains on furosemide prn PAF- TSH 0.61 08/08/2022, HR controlled with amiodarone, on Eliquis for clot prevention Depression- no mood changes, remains on Wellbutrin GERD- hgb 14.0 09/19/2022, remains on Protonix  No recent falls or injuries.   Recent blood pressures:  11/26- 135/76  11/19- 114/64  11/12- 132/72  Recent weights:  11/04- 92.2 lbs  10/08- 93.2 lbs  09/19- 89.5 lbs   Past Medical History:  Diagnosis Date   Anxiety    Arthritis    "some in my lower back; probably elbows, knees" (11/18/2017)   Atrial fibrillation (HCC)    Bell's palsy    when pt. was 77 yrs old, when under stress the left side of face will droop.   Complication of anesthesia    "vascular OR 2016; BP bottomed out; couldn't get it regulated; ended up in ICU for DAYS" (11/18/2017)   GERD (gastroesophageal reflux disease)    History of kidney stones    Hypertension    Hypertrophic cardiomyopathy (HCC)    severe LV basilar hypertrophy witn no evidence of significant outflow tract obstruction, EF 65-70%, mild LAE, mild TR, grade 1a diastolic dysfunction 05/15/10 (Dr. Donato Schultz) (Atrial Septal Hypertrophy pattern)-- Intra-op TEE with dsignificant outflow tract obstruction - AI, MR & TR   Insomnia    Mild aortic sclerosis    Osteopenia    Peripheral vascular disease (HCC)  Syncope    , Vagal   Past Surgical History:  Procedure Laterality Date   AUGMENTATION MAMMAPLASTY Bilateral    BACK SURGERY     CARDIAC CATHETERIZATION N/A 05/07/2016   Procedure: Left Heart Cath and Coronary Angiography;  Surgeon: Kathleene Hazel, MD;  Location: [facility] INVASIVE CV LAB;  Service: Cardiovascular;  Laterality: N/A;   CARDIOVERSION N/A 09/24/2017   Procedure: CARDIOVERSION;  Surgeon:  Laurey Morale, MD;  Location: [facility] ENDOSCOPY;  Service: Cardiovascular;  Laterality: N/A;   DILATION AND CURETTAGE OF UTERUS     ENDARTERECTOMY FEMORAL Right 03/02/2015   Procedure: ENDARTERECTOMY RIGHT FEMORAL;  Surgeon: Chuck Hint, MD;  Location: [facility] OR;  Service: Vascular;  Laterality: Right;   ESOPHAGOGASTRODUODENOSCOPY (EGD) WITH PROPOFOL N/A 12/01/2020   Procedure: ESOPHAGOGASTRODUODENOSCOPY (EGD) WITH PROPOFOL;  Surgeon: Diamantina Monks, MD;  Location: [facility] ENDOSCOPY;  Service: General;  Laterality: N/A;   FACIAL COSMETIC SURGERY Left 2002   "related to Bell's Palsy @ age 65; left eye/side of face droopy; tried to make area symmetrical"   FEMORAL-POPLITEAL BYPASS GRAFT Right 03/02/2015   Procedure: BYPASS GRAFT FEMORAL-BELOW KNEE POPLITEAL ARTERY;  Surgeon: Chuck Hint, MD;  Location: [facility] OR;  Service: Vascular;  Laterality: Right;   INGUINAL HERNIA REPAIR Bilateral 2002   IR ANGIO INTRA EXTRACRAN SEL COM CAROTID INNOMINATE UNI L MOD SED  11/16/2020   IR CT HEAD LTD  11/13/2020   IR PERCUTANEOUS ART THROMBECTOMY/INFUSION INTRACRANIAL INC DIAG ANGIO  11/13/2020   OVARIAN CYST REMOVAL Left    PEG PLACEMENT N/A 12/01/2020   Procedure: PERCUTANEOUS ENDOSCOPIC GASTROSTOMY (PEG) PLACEMENT;  Surgeon: Diamantina Monks, MD;  Location: [facility] ENDOSCOPY;  Service: General;  Laterality: N/A;   PERIPHERAL VASCULAR CATHETERIZATION N/A 01/16/2015   Procedure: Abdominal Aortogram;  Surgeon: Chuck Hint, MD;  Location: [facility] INVASIVE CV LAB;  Service: Cardiovascular;  Laterality: N/A;   POSTERIOR LUMBAR FUSION  2015   "have plates and screws in there"   RADIOLOGY WITH ANESTHESIA N/A 11/13/2020   Procedure: IR WITH ANESTHESIA;  Surgeon: Radiologist, Medication, MD;  Location: [facility] OR;  Service: Radiology;  Laterality: N/A;   TONSILLECTOMY      Allergies  Allergen Reactions   Amoxicillin Other (See Comments)    UTI Has patient had a PCN reaction causing immediate rash, facial/tongue/throat  swelling, SOB or lightheadedness with hypotension: No Has patient had a PCN reaction causing severe rash involving mucus membranes or skin necrosis: No Has patient had a PCN reaction that required hospitalization: No Has patient had a PCN reaction occurring within the last 10 years: Yes--UTI ONLY If all of the above answers are "NO", then may proceed with Cephalosporin use.    Atenolol Cough   Crestor [Rosuvastatin Calcium] Other (See Comments)    Muscle aches   Pravastatin Other (See Comments)    Muscle aches   Rosuvastatin Calcium    Sulfa Antibiotics Nausea Only   Codeine Other (See Comments)    hallucinations    Outpatient Encounter Medications as of 03/11/2023  Medication Sig   acetaminophen (TYLENOL) 325 MG tablet Take 650 mg by mouth every 6 (six) hours as needed for mild pain or fever.   albuterol (VENTOLIN HFA) 108 (90 Base) MCG/ACT inhaler Inhale 2 puffs into the lungs every 6 (six) hours as needed for wheezing or shortness of breath.   amiodarone (PACERONE) 200 MG tablet Take 200 mg by mouth daily. May give crushed   amLODipine (NORVASC) 5 MG tablet Take 2.5 mg by mouth  daily.   apixaban (ELIQUIS) 5 MG TABS tablet Take 5 mg by mouth 2 (two) times daily. May give crushed   bisacodyl (DULCOLAX) 5 MG EC tablet Take 5 mg by mouth 2 (two) times daily as needed for moderate constipation.   buPROPion (WELLBUTRIN XL) 150 MG 24 hr tablet Take 150 mg by mouth daily.   diclofenac Sodium (VOLTAREN) 1 % GEL Apply one application to right hip and knee four times daily as needed.   Emollient (CERAVE) CREA Apply 1 Application topically as needed.   ezetimibe (ZETIA) 10 MG tablet Take 10 mg by mouth daily. Oral if crushed   furosemide (LASIX) 20 MG tablet Take 20 mg by mouth daily as needed. Give Lasix 20mg  for weight gain equal or greater than 3 pounds   hydrALAZINE (APRESOLINE) 10 MG tablet Take 10 mg by mouth 3 (three) times daily as needed (SBP > 165 or DBP > 95).   hydrocortisone cream  1 % Apply 1 Application topically 2 (two) times daily as needed for itching.   ipratropium-albuterol (DUONEB) 0.5-2.5 (3) MG/3ML SOLN Inhale 3 mLs into the lungs every 4 (four) hours as needed.   ketoconazole (NIZORAL) 2 % cream Apply 1 application  topically daily as needed for irritation.   nystatin cream (MYCOSTATIN) Apply 1 application topically 2 (two) times daily as needed for dry skin (apply to perirectal rash).   ondansetron (ZOFRAN) 4 MG tablet Take 4 mg by mouth every 6 (six) hours as needed for nausea or vomiting.   OXYGEN Inhale 3 L into the lungs as directed. to maintain sats > or equal to 90%. May titrate Three Times A Day; 07:00 AM - 03:00 PM, 03:00 PM - 11:00 PM, 11:00 PM - 7:00 am   pantoprazole (PROTONIX) 40 MG tablet Take 40 mg by mouth 2 (two) times daily.   Polyethyl Glycol-Propyl Glycol (SYSTANE) 0.4-0.3 % SOLN Apply 2 drops to eye every 6 (six) hours as needed (dry eyes).   polyethylene glycol powder (GLYCOLAX/MIRALAX) 17 GM/SCOOP powder Take 17 g by mouth daily.   sennosides-docusate sodium (SENOKOT-S) 8.6-50 MG tablet Take 1 tablet by mouth daily.   Tiotropium Bromide-Olodaterol (STIOLTO RESPIMAT) 2.5-2.5 MCG/ACT AERS Inhale 2 puffs into the lungs daily.   Facility-Administered Encounter Medications as of 03/11/2023  Medication   botulinum toxin Type A (BOTOX) injection 100 Units    Review of Systems  Constitutional:  Negative for activity change and appetite change.  HENT:  Negative for sore throat and trouble swallowing.   Eyes:  Negative for visual disturbance.  Respiratory:  Positive for shortness of breath. Negative for cough and wheezing.   Cardiovascular:  Negative for chest pain and leg swelling.  Gastrointestinal:  Negative for abdominal distention and abdominal pain.  Genitourinary:  Negative for dysuria.  Musculoskeletal:  Positive for arthralgias and gait problem.  Skin:  Negative for wound.  Neurological:  Positive for weakness. Negative for dizziness  and light-headedness.  Psychiatric/Behavioral:  Positive for dysphoric mood. Negative for confusion and sleep disturbance. The patient is not nervous/anxious.     Immunization History  Administered Date(s) Administered   Fluad Quad(high Dose 65+) 02/05/2022   Influenza Split 01/16/2009, 04/30/2010, 01/26/2016, 02/07/2018, 02/01/2020   Influenza, High Dose Seasonal PF 02/07/2018, 01/25/2019, 02/28/2021, 02/05/2022   Influenza,inj,Quad PF,6+ Mos 03/06/2012, 01/26/2016, 01/10/2017   Influenza,inj,quad, With Preservative 02/14/2015   Moderna Covid-19 Vaccine Bivalent Booster 38yrs & up 02/28/2021   PFIZER(Purple Top)SARS-COV-2 Vaccination 05/06/2019, 05/27/2019, 01/27/2020, 09/18/2020   Pneumococcal Conjugate-13 03/19/2017   Pneumococcal  Polysaccharide-23 02/14/2015   Respiratory Syncytial Virus Vaccine,Recomb Aduvanted(Arexvy) 02/15/2022   Td 02/25/2005   Tdap 05/19/2013   Zoster, Live 07/28/2006   Pertinent  Health Maintenance Due  Topic Date Due   INFLUENZA VACCINE  11/14/2022   DEXA SCAN  Discontinued      07/09/2021    8:00 AM 05/07/2022   11:59 AM 08/13/2022   11:02 AM 11/26/2022   12:34 PM 12/31/2022    3:26 PM  Fall Risk  Falls in the past year?  0 1 0 0  Was there an injury with Fall?  0 0 0 0  Fall Risk Category Calculator  0 1 0 0  (RETIRED) Patient Fall Risk Level Moderate fall risk      Patient at Risk for Falls Due to  No Fall Risks History of fall(s);Impaired balance/gait;Impaired mobility History of fall(s);Impaired balance/gait History of fall(s);Impaired balance/gait;Impaired mobility  Patient at Risk for Falls Due to - Comments   right sided hemiparesis    Fall risk Follow up  Falls evaluation completed Falls evaluation completed;Education provided;Falls prevention discussed Falls evaluation completed;Education provided Falls evaluation completed;Education provided;Falls prevention discussed   Functional Status Survey:    Vitals:   03/11/23 1431  BP: 114/64   Pulse: (!) 53  Resp: 17  Temp: 97.8 F (36.6 C)  SpO2: 93%  Weight: 92 lb 3.2 oz (41.8 kg)  Height: 5\' 3"  (1.6 m)   Body mass index is 16.33 kg/m. Physical Exam Vitals reviewed.  Constitutional:      General: She is not in acute distress. HENT:     Head: Normocephalic.  Eyes:     General:        Right eye: No discharge.        Left eye: No discharge.  Cardiovascular:     Rate and Rhythm: Normal rate and regular rhythm.     Pulses: Normal pulses.     Heart sounds: Normal heart sounds.  Pulmonary:     Effort: Pulmonary effort is normal. No respiratory distress.     Breath sounds: Normal breath sounds. No wheezing or rales.     Comments: 2-3 liters Abdominal:     General: Bowel sounds are normal. There is no distension.     Palpations: Abdomen is soft.  Musculoskeletal:     Cervical back: Neck supple.     Right lower leg: No edema.     Left lower leg: No edema.  Skin:    General: Skin is warm.     Capillary Refill: Capillary refill takes less than 2 seconds.  Neurological:     General: No focal deficit present.     Mental Status: She is alert and oriented to person, place, and time. Mental status is at baseline.     Motor: Weakness present.     Gait: Gait abnormal.     Comments: Right sided weakness, rigidity. Uses walker and PWC for longer distances  Psychiatric:        Mood and Affect: Mood normal.     Labs reviewed: Recent Labs    03/18/22 0500  NA 140  K 4.3  CL 106  CO2 26*  BUN 31*  CREATININE 0.9  CALCIUM 8.9   Recent Labs    03/18/22 0500  AST 33  ALT 39*  ALKPHOS 89  ALBUMIN 3.4*   Recent Labs    03/18/22 0500 09/19/22 0000  WBC 5.9 7.4  HGB 13.3 14.0  HCT 41 43  PLT 224 246  Lab Results  Component Value Date   TSH 0.61 08/08/2022   Lab Results  Component Value Date   HGBA1C 6.0 (H) 11/13/2020   Lab Results  Component Value Date   CHOL 182 11/22/2021   HDL 80 (A) 11/22/2021   LDLCALC 89 11/22/2021   TRIG 64 11/22/2021    CHOLHDL 3.0 11/13/2020    Significant Diagnostic Results in last 30 days:  No results found.  Assessment/Plan 1. Right hemiplegia (HCC) - 11/2020 acute left MCA with right sided weakness - followed by Dr. Terrace Arabia - 11/13 Botox injections> plan to continue every 3 months  - cont skilled nursing  2. Pulmonary emphysema, unspecified emphysema type (HCC) - no exacerbations - followed by pulmonary - cont Stiolto and duonebs  3. Chronic respiratory failure with hypoxia (HCC) - cont oxygen  4. Essential hypertension - controlled with amlodipine   5. Mixed hyperlipidemia - allergy to statin - cont Zetia  6. Chronic diastolic congestive heart failure (HCC) - compensated - cont furosemide prn  7. PAF (paroxysmal atrial fibrillation) (HCC) - HR< 100 with amiodarone - cont Eliquis for clot prevention  8. Moderate episode of recurrent major depressive disorder (HCC) - no mood changes - very supportive husband - cont Wellbutrin  9. Gastroesophageal reflux disease without esophagitis - hgb stable - cont pantoprazole    Family/ staff Communication: plan discussed with patient, husband and nurse  Labs/tests ordered:  cbc/diff, cmp, lipid panel

## 2023-03-18 DIAGNOSIS — I5020 Unspecified systolic (congestive) heart failure: Secondary | ICD-10-CM | POA: Diagnosis not present

## 2023-03-18 DIAGNOSIS — I10 Essential (primary) hypertension: Secondary | ICD-10-CM | POA: Diagnosis not present

## 2023-03-18 DIAGNOSIS — I4819 Other persistent atrial fibrillation: Secondary | ICD-10-CM | POA: Diagnosis not present

## 2023-03-18 LAB — LIPID PANEL
Cholesterol: 164 (ref 0–200)
HDL: 61 (ref 35–70)
LDL Cholesterol: 67
LDl/HDL Ratio: 2.7
Triglycerides: 179 — AB (ref 40–160)

## 2023-03-18 LAB — HEPATIC FUNCTION PANEL
ALT: 23 U/L (ref 7–35)
AST: 27 (ref 13–35)
Alkaline Phosphatase: 96 (ref 25–125)
Bilirubin, Total: 0.5

## 2023-03-18 LAB — CBC AND DIFFERENTIAL
HCT: 38 (ref 36–46)
Hemoglobin: 12.5 (ref 12.0–16.0)
Platelets: 180 10*3/uL (ref 150–400)
WBC: 4

## 2023-03-18 LAB — COMPREHENSIVE METABOLIC PANEL
Albumin: 4.2 (ref 3.5–5.0)
Calcium: 10 (ref 8.7–10.7)
Globulin: 2.8
eGFR: 82

## 2023-03-18 LAB — BASIC METABOLIC PANEL
BUN: 18 (ref 4–21)
CO2: 22 (ref 13–22)
Chloride: 106 (ref 99–108)
Creatinine: 0.8 (ref 0.5–1.1)
Glucose: 114
Potassium: 4.4 meq/L (ref 3.5–5.1)
Sodium: 140 (ref 137–147)

## 2023-03-18 LAB — CBC: RBC: 3.83 — AB (ref 3.87–5.11)

## 2023-03-31 ENCOUNTER — Non-Acute Institutional Stay (SKILLED_NURSING_FACILITY): Payer: Medicare Other | Admitting: Internal Medicine

## 2023-03-31 ENCOUNTER — Encounter: Payer: Self-pay | Admitting: Internal Medicine

## 2023-03-31 DIAGNOSIS — G8191 Hemiplegia, unspecified affecting right dominant side: Secondary | ICD-10-CM

## 2023-03-31 DIAGNOSIS — M791 Myalgia, unspecified site: Secondary | ICD-10-CM

## 2023-03-31 NOTE — Progress Notes (Signed)
Location:  [facility] Room Number: 116A Place of Service:  SNF [phone]) Provider:  [name], MD   [name], MD  Patient Care Team: [name], MD as PCP - General (Internal Medicine) [name], MD as PCP - Cardiology (Cardiology) Duke Salvia, MD as PCP - Electrophysiology (Cardiology) Shirlean Kelly, MD as Consulting Physician (Neurosurgery)  Extended Emergency Contact Information Primary Emergency Contact: Hartis,[name] Address: [address]          [place] [phone] [name] Home Phone: [phone] Mobile Phone: [phone] Relation: Spouse Secondary Emergency Contact: Salomon,[name] Address: [address]          [place], [place] [address] [name] Mobile Phone: [phone] Relation: Son  Code Status:  DNR Goals of care: Advanced Directive information    03/31/2023    5:02 PM  Advanced Directives  Does Patient Have a Medical Advance Directive? Yes  Would patient like information on creating a medical advance directive? No - Patient declined     Chief Complaint  Patient presents with   Acute Visit    Patient is being seen for acute arm pain     HPI:  Pt is a 77 y.o. 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 was 77 yrs old, when under stress the left side of face will droop.   Complication of anesthesia    "vascular OR 2016; BP bottomed out; couldn't get it regulated; ended up in ICU for DAYS" (11/18/2017)   GERD (gastroesophageal reflux disease)    History of kidney stones    Hypertension    Hypertrophic cardiomyopathy (HCC)    severe LV basilar hypertrophy witn no evidence of significant outflow tract obstruction, EF 65-70%, mild LAE, mild TR, grade 1a diastolic dysfunction 05/15/10 (Dr. Donato Schultz) (Atrial Septal Hypertrophy pattern)-- Intra-op TEE with dsignificant outflow tract obstruction - AI, MR & TR   Insomnia    Mild aortic sclerosis    Osteopenia    Peripheral vascular disease (HCC)    Syncope    , Vagal   Past Surgical History:  Procedure Laterality Date   AUGMENTATION MAMMAPLASTY Bilateral    BACK SURGERY     CARDIAC CATHETERIZATION N/A 05/07/2016   Procedure: Left Heart Cath and Coronary Angiography;  Surgeon: Kathleene Hazel, MD;  Location: [facility] INVASIVE CV LAB;  Service: Cardiovascular;  Laterality: N/A;   CARDIOVERSION N/A 09/24/2017   Procedure: CARDIOVERSION;  Surgeon: Laurey Morale, MD;  Location: [facility] ENDOSCOPY;  Service: Cardiovascular;  Laterality: N/A;   DILATION AND CURETTAGE OF UTERUS     ENDARTERECTOMY FEMORAL Right 03/02/2015   Procedure: ENDARTERECTOMY RIGHT FEMORAL;  Surgeon: Chuck Hint, MD;  [facility] OR;  Service: Vascular;  Laterality: Right;   ESOPHAGOGASTRODUODENOSCOPY (EGD) WITH PROPOFOL N/A 12/01/2020   Procedure: ESOPHAGOGASTRODUODENOSCOPY (EGD) WITH PROPOFOL;  Surgeon: Diamantina Monks, MD;  Location: [facility] ENDOSCOPY;  Service: General;  Laterality: N/A;   FACIAL COSMETIC SURGERY Left 2002   "related to Bell's Palsy @ age 71; left eye/side of face droopy; tried to make area symmetrical"   FEMORAL-POPLITEAL BYPASS GRAFT Right 03/02/2015    Procedure: BYPASS GRAFT FEMORAL-BELOW KNEE POPLITEAL ARTERY;  Surgeon: Chuck Hint, MD;  Location: [facility] OR;  Service: Vascular;  Laterality: Right;   INGUINAL HERNIA REPAIR Bilateral 2002   IR ANGIO INTRA EXTRACRAN SEL COM CAROTID INNOMINATE UNI L MOD SED  11/16/2020   IR CT HEAD LTD  11/13/2020   IR PERCUTANEOUS ART THROMBECTOMY/INFUSION INTRACRANIAL INC DIAG ANGIO  11/13/2020   OVARIAN CYST REMOVAL Left    PEG PLACEMENT N/A 12/01/2020   Procedure: PERCUTANEOUS ENDOSCOPIC GASTROSTOMY (PEG) PLACEMENT;  Surgeon: Diamantina Monks, MD;  Location: [facility] ENDOSCOPY;  Service: General;  Laterality: N/A;   PERIPHERAL VASCULAR CATHETERIZATION N/A 01/16/2015   Procedure: Abdominal Aortogram;  Surgeon: Chuck Hint, MD;  Location: [facility] INVASIVE CV LAB;  Service: Cardiovascular;  Laterality: N/A;   POSTERIOR LUMBAR FUSION  2015   "have plates and screws in there"   RADIOLOGY WITH ANESTHESIA N/A 11/13/2020   Procedure: IR WITH ANESTHESIA;  Surgeon: Radiologist, Medication, MD;  Location: [facility] OR;  Service: Radiology;  Laterality: N/A;   TONSILLECTOMY      Allergies  Allergen Reactions   Amoxicillin Other (See Comments)    UTI Has patient had a PCN reaction causing immediate rash, facial/tongue/throat swelling, SOB or lightheadedness with hypotension: No Has patient had a PCN reaction causing severe rash involving mucus membranes or skin necrosis: No Has patient had a PCN reaction that required hospitalization: No Has patient had a PCN reaction occurring within the last 10 years: Yes--UTI ONLY If all of the above answers are "NO", then may proceed with Cephalosporin use.    Atenolol Cough   Crestor [Rosuvastatin Calcium] Other (See Comments)    Muscle aches   Pravastatin Other (See Comments)    Muscle aches   Rosuvastatin Calcium    Sulfa Antibiotics Nausea Only   Codeine Other (See Comments)    hallucinations    Outpatient Encounter Medications as of 03/31/2023  Medication Sig    acetaminophen (TYLENOL) 325 MG tablet Take 650 mg by mouth every 6 (six) hours as needed for mild pain or fever.   albuterol (VENTOLIN HFA) 108 (90 Base) MCG/ACT inhaler Inhale 2 puffs into the lungs every 6 (six) hours as needed for wheezing or shortness of breath.   amiodarone (PACERONE) 200 MG tablet Take 200 mg by mouth daily. May give crushed   amLODipine (NORVASC) 5 MG tablet Take 2.5 mg by mouth daily.   apixaban (ELIQUIS) 5 MG TABS tablet Take 5 mg by mouth 2 (two) times daily. May give crushed   bisacodyl (DULCOLAX) 5 MG EC tablet Take 5 mg by mouth 2 (two) times daily as needed for moderate constipation.   buPROPion (WELLBUTRIN XL) 150 MG 24 hr tablet Take 150 mg by mouth daily.   diclofenac Sodium (VOLTAREN) 1 % GEL Apply one application to right hip and knee four times daily as needed.   Emollient (  CERAVE) CREA Apply 1 Application topically as needed.   ezetimibe (ZETIA) 10 MG tablet Take 10 mg by mouth daily. Oral if crushed   furosemide (LASIX) 20 MG tablet Take 20 mg by mouth daily as needed. Give Lasix 20mg  for weight gain equal or greater than 3 pounds   hydrALAZINE (APRESOLINE) 10 MG tablet Take 10 mg by mouth 3 (three) times daily as needed (SBP > 165 or DBP > 95).   hydrocortisone cream 1 % Apply 1 Application topically 2 (two) times daily as needed for itching.   ipratropium-albuterol (DUONEB) 0.5-2.5 (3) MG/3ML SOLN Inhale 3 mLs into the lungs every 4 (four) hours as needed.   ketoconazole (NIZORAL) 2 % cream Apply 1 application  topically daily as needed for irritation.   nystatin cream (MYCOSTATIN) Apply 1 application topically 2 (two) times daily as needed for dry skin (apply to perirectal rash).   ondansetron (ZOFRAN) 4 MG tablet Take 4 mg by mouth every 6 (six) hours as needed for nausea or vomiting.   OXYGEN Inhale 3 L into the lungs as directed. to maintain sats > or equal to 90%. May titrate Three Times A Day; 07:00 AM - 03:00 PM, 03:00 PM - 11:00 PM, 11:00 PM - 7:00  am   pantoprazole (PROTONIX) 40 MG tablet Take 40 mg by mouth 2 (two) times daily.   Polyethyl Glycol-Propyl Glycol (SYSTANE) 0.4-0.3 % SOLN Apply 2 drops to eye every 6 (six) hours as needed (dry eyes).   polyethylene glycol powder (GLYCOLAX/MIRALAX) 17 GM/SCOOP powder Take 17 g by mouth daily.   sennosides-docusate sodium (SENOKOT-S) 8.6-50 MG tablet Take 1 tablet by mouth daily.   Tiotropium Bromide-Olodaterol (STIOLTO RESPIMAT) 2.5-2.5 MCG/ACT AERS Inhale 2 puffs into the lungs daily.   Facility-Administered Encounter Medications as of 03/31/2023  Medication   botulinum toxin Type A (BOTOX) injection 100 Units    Review of Systems  Constitutional:  Negative for activity change and appetite change.  HENT: Negative.    Respiratory:  Negative for cough and shortness of breath.   Cardiovascular:  Negative for leg swelling.  Gastrointestinal:  Negative for constipation.  Genitourinary: Negative.   Musculoskeletal:  Positive for arthralgias, gait problem and myalgias.  Skin: Negative.   Neurological:  Negative for dizziness and weakness.  Psychiatric/Behavioral:  Positive for sleep disturbance. Negative for confusion and dysphoric mood.     Immunization History  Administered Date(s) Administered   Fluad Quad(high Dose 65+) 02/05/2022, 01/08/2023   Influenza Split 01/16/2009, 04/30/2010, 01/26/2016, 02/07/2018, 02/01/2020   Influenza, High Dose Seasonal PF 02/07/2018, 01/25/2019, 02/28/2021, 02/05/2022   Influenza,inj,Quad PF,6+ Mos 03/06/2012, 01/26/2016, 01/10/2017   Influenza,inj,quad, With Preservative 02/14/2015   Moderna Covid-19 Vaccine Bivalent Booster 63yrs & up 02/28/2021, 02/04/2023   PFIZER(Purple Top)SARS-COV-2 Vaccination 05/06/2019, 05/27/2019, 01/27/2020, 09/18/2020   Pneumococcal Conjugate-13 03/19/2017   Pneumococcal Polysaccharide-23 02/14/2015   Respiratory Syncytial Virus Vaccine,Recomb Aduvanted(Arexvy) 02/15/2022   Td 02/25/2005   Tdap 05/19/2013   Zoster,  Live 07/28/2006   Pertinent  Health Maintenance Due  Topic Date Due   INFLUENZA VACCINE  Completed   DEXA SCAN  Discontinued      05/07/2022   11:59 AM 08/13/2022   11:02 AM 11/26/2022   12:34 PM 12/31/2022    3:26 PM 03/11/2023    2:48 PM  Fall Risk  Falls in the past year? 0 1 0 0 1  Was there an injury with Fall? 0 0 0 0 0  Fall Risk Category Calculator 0 1 0 0 1  Patient at Risk for Falls Due to No Fall Risks History of fall(s);Impaired balance/gait;Impaired mobility History of fall(s);Impaired balance/gait History of fall(s);Impaired balance/gait;Impaired mobility History of fall(s);Impaired balance/gait;Impaired mobility  Patient at Risk for Falls Due to - Comments  right sided hemiparesis     Fall risk Follow up Falls evaluation completed Falls evaluation completed;Education provided;Falls prevention discussed Falls evaluation completed;Education provided Falls evaluation completed;Education provided;Falls prevention discussed Falls evaluation completed;Education provided;Falls prevention discussed   Functional Status Survey:    There were no vitals filed for this visit. There is no height or weight on file to calculate BMI. Physical Exam Vitals reviewed.  Constitutional:      Appearance: Normal appearance.  HENT:     Head: Normocephalic.     Nose: Nose normal.     Mouth/Throat:     Mouth: Mucous membranes are moist.     Pharynx: Oropharynx is clear.  Eyes:     Pupils: Pupils are equal, round, and reactive to light.  Cardiovascular:     Rate and Rhythm: Normal rate. Rhythm irregular.     Pulses: Normal pulses.     Heart sounds: Normal heart sounds. No murmur heard. Pulmonary:     Effort: Pulmonary effort is normal.     Breath sounds: Normal breath sounds.  Abdominal:     General: Abdomen is flat. Bowel sounds are normal.     Palpations: Abdomen is soft.  Musculoskeletal:        General: No swelling.     Cervical back: Neck supple.  Skin:    General: Skin is  warm.  Neurological:     Mental Status: She is alert and oriented to person, place, and time.     Comments: Has right Hemiparesis Foot Drop in Right leg Left Exam was normal  Psychiatric:        Mood and Affect: Mood normal.        Thought Content: Thought content normal.     Labs reviewed: No results for input(s): "NA", "K", "CL", "CO2", "GLUCOSE", "BUN", "CREATININE", "CALCIUM", "MG", "PHOS" in the last 8760 hours. No results for input(s): "AST", "ALT", "ALKPHOS", "BILITOT", "PROT", "ALBUMIN" in the last 8760 hours. Recent Labs    09/19/22 0000  WBC 7.4  HGB 14.0  HCT 43  PLT 246   Lab Results  Component Value Date   TSH 0.61 08/08/2022   Lab Results  Component Value Date   HGBA1C 6.0 (H) 11/13/2020   Lab Results  Component Value Date   CHOL 182 11/22/2021   HDL 80 (A) 11/22/2021   LDLCALC 89 11/22/2021   TRIG 64 11/22/2021   CHOLHDL 3.0 11/13/2020    Significant Diagnostic Results in last 30 days:  No results found.  Assessment/Plan 1. Muscular pain (Primary) Will Continue to use tylenol Prn Skip GYM for now IF pain persists will proceed with more work up  2. Right hemiplegia (HCC) Make sure Brace is adjusted well for her LE     Family/ staff Communication:   Labs/tests ordered:

## 2023-04-17 ENCOUNTER — Telehealth: Payer: Self-pay | Admitting: Cardiology

## 2023-04-17 NOTE — Telephone Encounter (Signed)
 STAT if HR is under 50 or over 120 (normal HR is 60-100 beats per minute)  What is your heart rate? 40's  Do you have a log of your heart rate readings (document readings)? No  Do you have any other symptoms? No  Misty from [facility] was calling because the patient's HR has been ranging the 40's and would like to know if the medications needs adjusting. Please advise.

## 2023-04-17 NOTE — Telephone Encounter (Signed)
 Attempted phone call to pt's husband, DPR and left voicemail message to contact RN at [phone].

## 2023-04-17 NOTE — Telephone Encounter (Signed)
 [facility] nurse with [facility].  Reports pt HR has been in the 40's since yesterday 04/16/23.  Reports the following BP readings: 12/3-99/58-66, 12/10-143/74-55 and 12/31-91/58-53.  HR 40's today pt takes amiodarone  200 mg PO every day and wants to know if medication needs to be held.    Misty reports pt denies dizziness, feeling like she will pass out, and chest discomfort.  Misty reports pt is able to express if something does not feel right.  Misty reports facility is able to do an EKG and will call back with the results.  Advised to hold amiodarone  until EKG is confirmed.

## 2023-04-17 NOTE — Telephone Encounter (Signed)
 Received EKG via fax will forward to Dr. Graciela Husbands to advise.

## 2023-04-17 NOTE — Telephone Encounter (Addendum)
 Spoke with Misty at [facility] who states pt has nor new symptoms of dizziness, fatigue or CP. Advised EKG review by Daphne Barrack, NP uncertain EKG shows Afib as read by machine.  No need to hold Amlodipine  for low HR as it has a long wash out period and pt has chronic bradycardia and again is asymptomatic.  Advised pt has not been seen by EP since 07/2021 and should have a follow up appointment for further evaluation in the near future.  Mist advises RN should contact pt's husband who takes pt to her appointment to schedule with him.  She thanked [employer]DR[employer] for the callback.

## 2023-04-17 NOTE — Telephone Encounter (Signed)
 Misty called back to report EKG finding- AF, inverted T wave and slight ST depression HR 46.  She will fax EKG tracing to our office for MD to review.

## 2023-04-18 ENCOUNTER — Ambulatory Visit: Payer: Medicare Other | Admitting: Internal Medicine

## 2023-04-21 NOTE — Telephone Encounter (Signed)
 Noted.

## 2023-05-06 ENCOUNTER — Non-Acute Institutional Stay (SKILLED_NURSING_FACILITY): Payer: Medicare Other | Admitting: Orthopedic Surgery

## 2023-05-06 ENCOUNTER — Encounter: Payer: Self-pay | Admitting: Orthopedic Surgery

## 2023-05-06 DIAGNOSIS — J9611 Chronic respiratory failure with hypoxia: Secondary | ICD-10-CM | POA: Diagnosis not present

## 2023-05-06 DIAGNOSIS — I5032 Chronic diastolic (congestive) heart failure: Secondary | ICD-10-CM

## 2023-05-06 DIAGNOSIS — J439 Emphysema, unspecified: Secondary | ICD-10-CM

## 2023-05-06 DIAGNOSIS — F331 Major depressive disorder, recurrent, moderate: Secondary | ICD-10-CM

## 2023-05-06 DIAGNOSIS — G8191 Hemiplegia, unspecified affecting right dominant side: Secondary | ICD-10-CM

## 2023-05-06 DIAGNOSIS — I10 Essential (primary) hypertension: Secondary | ICD-10-CM

## 2023-05-06 DIAGNOSIS — E782 Mixed hyperlipidemia: Secondary | ICD-10-CM | POA: Diagnosis not present

## 2023-05-06 DIAGNOSIS — I480 Paroxysmal atrial fibrillation: Secondary | ICD-10-CM

## 2023-05-06 DIAGNOSIS — K219 Gastro-esophageal reflux disease without esophagitis: Secondary | ICD-10-CM

## 2023-05-06 DIAGNOSIS — L853 Xerosis cutis: Secondary | ICD-10-CM | POA: Diagnosis not present

## 2023-05-06 NOTE — Progress Notes (Signed)
Location:  [facility] Room Number: 116/A Place of Service:  SNF [id number]) Provider:  Octavia Heir, NP   [name], MD  Patient Care Team: [name], MD as PCP - General (Internal Medicine) [name], MD as PCP - Cardiology (Cardiology) Duke Salvia, MD as PCP - Electrophysiology (Cardiology) Shirlean Kelly, MD as Consulting Physician (Neurosurgery)  Extended Emergency Contact Information Primary Emergency Contact: Kardell,[name] Address: [address]          [place] [phone] [name] Home Phone: [phone] Mobile Phone: [phone] Relation: Spouse Secondary Emergency Contact: Sprenkle,[name] Address: [address]          [place], [place] [address] [name] Mobile Phone: [phone] Relation: Son  Code Status: Full code Goals of care: Advanced Directive information    03/31/2023    5:02 PM  Advanced Directives  Does Patient Have a Medical Advance Directive? Yes  Would patient like information on creating a medical advance directive? No - Patient declined     Chief Complaint  Patient presents with   Medical Management of Chronic Issues    HPI:  Pt is a 78 y.o. female seen today for medical management of chronic diseases.    She currently resides on the skilled nursing unit at [facility]. 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 Terrace Arabia next appointment 02/10 Pulmonary emphysema- followed by pulmonary, remains on Stioloto and duonebs prn Chronic respiratory failure with hypoxia- now on 2-3 liters continuous oxygen HTN- BUN/creat 17.5/0.75 03/18/2023, remains on amlodipine HLD- total 164, LDL 67 03/18/2023, statin  intolerance, remains on Zetia CHF- 2D echo LVEF 60-65% 06/2021, remains on furosemide prn PAF- TSH 0.61 08/08/2022, recently low HR (40-50's)> cardiology advised to continue amiodarone> cardiology f/u 01/30, on Eliquis for clot prevention Depression- no mood changes, remains on Wellbutrin GERD- hgb 14.0 09/19/2022, remains on Protonix Dry skin- skin under nasal cannula most bothersome, lips also dry, she is on humidified air, remains on hydrocortisone/ketoconazole cream   Past Medical History:  Diagnosis Date   Anxiety    Arthritis    "some in my lower back; probably elbows, knees" (11/18/2017)   Atrial fibrillation (HCC)    Bell's palsy    when pt. was 78 yrs old, when under stress the left side of face will droop.   Complication of anesthesia    "vascular OR 2016; BP bottomed out; couldn't get it regulated; ended up in ICU for DAYS" (11/18/2017)   GERD (gastroesophageal reflux disease)    History of kidney stones    Hypertension    Hypertrophic cardiomyopathy (HCC)    severe LV basilar hypertrophy witn no evidence of significant outflow tract obstruction, EF 65-70%, mild LAE, mild TR, grade 1a diastolic dysfunction 05/15/10 (Dr. Donato Schultz) (Atrial Septal Hypertrophy pattern)-- Intra-op TEE with dsignificant outflow tract obstruction - AI, MR & TR   Insomnia    Mild aortic sclerosis    Osteopenia    Peripheral vascular disease (HCC)    Syncope    , Vagal   Past Surgical History:  Procedure Laterality Date   AUGMENTATION MAMMAPLASTY Bilateral    BACK SURGERY     CARDIAC CATHETERIZATION N/A 05/07/2016   Procedure: Left Heart  Cath and Coronary Angiography;  Surgeon: Kathleene Hazel, MD;  Location: [facility] INVASIVE CV LAB;  Service: Cardiovascular;  Laterality: N/A;   CARDIOVERSION N/A 09/24/2017   Procedure: CARDIOVERSION;  Surgeon: Laurey Morale, MD;  Location: [facility] ENDOSCOPY;  Service: Cardiovascular;  Laterality: N/A;   DILATION AND CURETTAGE OF UTERUS     ENDARTERECTOMY FEMORAL  Right 03/02/2015   Procedure: ENDARTERECTOMY RIGHT FEMORAL;  Surgeon: Chuck Hint, MD;  Location: [facility] OR;  Service: Vascular;  Laterality: Right;   ESOPHAGOGASTRODUODENOSCOPY (EGD) WITH PROPOFOL N/A 12/01/2020   Procedure: ESOPHAGOGASTRODUODENOSCOPY (EGD) WITH PROPOFOL;  Surgeon: Diamantina Monks, MD;  Location: [facility] ENDOSCOPY;  Service: General;  Laterality: N/A;   FACIAL COSMETIC SURGERY Left 2002   "related to Bell's Palsy @ age 65; left eye/side of face droopy; tried to make area symmetrical"   FEMORAL-POPLITEAL BYPASS GRAFT Right 03/02/2015   Procedure: BYPASS GRAFT FEMORAL-BELOW KNEE POPLITEAL ARTERY;  Surgeon: Chuck Hint, MD;  Location: [facility] OR;  Service: Vascular;  Laterality: Right;   INGUINAL HERNIA REPAIR Bilateral 2002   IR ANGIO INTRA EXTRACRAN SEL COM CAROTID INNOMINATE UNI L MOD SED  11/16/2020   IR CT HEAD LTD  11/13/2020   IR PERCUTANEOUS ART THROMBECTOMY/INFUSION INTRACRANIAL INC DIAG ANGIO  11/13/2020   OVARIAN CYST REMOVAL Left    PEG PLACEMENT N/A 12/01/2020   Procedure: PERCUTANEOUS ENDOSCOPIC GASTROSTOMY (PEG) PLACEMENT;  Surgeon: Diamantina Monks, MD;  Location: [facility] ENDOSCOPY;  Service: General;  Laterality: N/A;   PERIPHERAL VASCULAR CATHETERIZATION N/A 01/16/2015   Procedure: Abdominal Aortogram;  Surgeon: Chuck Hint, MD;  Location: [facility] INVASIVE CV LAB;  Service: Cardiovascular;  Laterality: N/A;   POSTERIOR LUMBAR FUSION  2015   "have plates and screws in there"   RADIOLOGY WITH ANESTHESIA N/A 11/13/2020   Procedure: IR WITH ANESTHESIA;  Surgeon: Radiologist, Medication, MD;  Location: [facility] OR;  Service: Radiology;  Laterality: N/A;   TONSILLECTOMY      Allergies  Allergen Reactions   Amoxicillin Other (See Comments)    UTI Has patient had a PCN reaction causing immediate rash, facial/tongue/throat swelling, SOB or lightheadedness with hypotension: No Has patient had a PCN reaction causing severe rash involving mucus membranes or skin necrosis:  No Has patient had a PCN reaction that required hospitalization: No Has patient had a PCN reaction occurring within the last 10 years: Yes--UTI ONLY If all of the above answers are "NO", then may proceed with Cephalosporin use.    Atenolol Cough   Crestor [Rosuvastatin Calcium] Other (See Comments)    Muscle aches   Pravastatin Other (See Comments)    Muscle aches   Rosuvastatin Calcium    Sulfa Antibiotics Nausea Only   Codeine Other (See Comments)    hallucinations    Outpatient Encounter Medications as of 05/06/2023  Medication Sig   acetaminophen (TYLENOL) 325 MG tablet Take 650 mg by mouth every 6 (six) hours as needed for mild pain or fever.   albuterol (VENTOLIN HFA) 108 (90 Base) MCG/ACT inhaler Inhale 2 puffs into the lungs every 6 (six) hours as needed for wheezing or shortness of breath.   amiodarone (PACERONE) 200 MG tablet Take 200 mg by mouth daily. May give crushed   amLODipine (NORVASC) 5 MG tablet Take 2.5 mg by mouth daily.   apixaban (ELIQUIS) 5 MG TABS tablet Take 5 mg by mouth 2 (two) times daily. May give crushed   bisacodyl (DULCOLAX) 5 MG EC tablet Take 5 mg by mouth 2 (two) times daily  as needed for moderate constipation.   buPROPion (WELLBUTRIN XL) 150 MG 24 hr tablet Take 150 mg by mouth daily.   diclofenac Sodium (VOLTAREN) 1 % GEL Apply one application to right hip and knee four times daily as needed.   Emollient (CERAVE) CREA Apply 1 Application topically as needed.   ezetimibe (ZETIA) 10 MG tablet Take 10 mg by mouth daily. Oral if crushed   furosemide (LASIX) 20 MG tablet Take 20 mg by mouth daily as needed. Give Lasix 20mg  for weight gain equal or greater than 3 pounds   hydrALAZINE (APRESOLINE) 10 MG tablet Take 10 mg by mouth 3 (three) times daily as needed (SBP > 165 or DBP > 95).   hydrocortisone cream 1 % Apply 1 Application topically 2 (two) times daily as needed for itching.   ipratropium-albuterol (DUONEB) 0.5-2.5 (3) MG/3ML SOLN Inhale 3 mLs  into the lungs every 4 (four) hours as needed.   ketoconazole (NIZORAL) 2 % cream Apply 1 application  topically daily as needed for irritation.   nystatin cream (MYCOSTATIN) Apply 1 application topically 2 (two) times daily as needed for dry skin (apply to perirectal rash).   ondansetron (ZOFRAN) 4 MG tablet Take 4 mg by mouth every 6 (six) hours as needed for nausea or vomiting.   OXYGEN Inhale 3 L into the lungs as directed. to maintain sats > or equal to 90%. May titrate Three Times A Day; 07:00 AM - 03:00 PM, 03:00 PM - 11:00 PM, 11:00 PM - 7:00 am   pantoprazole (PROTONIX) 40 MG tablet Take 40 mg by mouth 2 (two) times daily.   Polyethyl Glycol-Propyl Glycol (SYSTANE) 0.4-0.3 % SOLN Apply 2 drops to eye every 6 (six) hours as needed (dry eyes).   polyethylene glycol powder (GLYCOLAX/MIRALAX) 17 GM/SCOOP powder Take 17 g by mouth daily.   sennosides-docusate sodium (SENOKOT-S) 8.6-50 MG tablet Take 1 tablet by mouth daily.   Tiotropium Bromide-Olodaterol (STIOLTO RESPIMAT) 2.5-2.5 MCG/ACT AERS Inhale 2 puffs into the lungs daily.   Facility-Administered Encounter Medications as of 05/06/2023  Medication   botulinum toxin Type A (BOTOX) injection 100 Units    Review of Systems  Constitutional:  Negative for activity change and appetite change.  HENT:  Negative for sore throat and trouble swallowing.   Eyes:  Negative for visual disturbance.  Respiratory:  Positive for shortness of breath and wheezing. Negative for cough.   Cardiovascular:  Negative for chest pain and leg swelling.  Gastrointestinal:  Negative for abdominal distention and abdominal pain.  Genitourinary:  Negative for dysuria, flank pain, hematuria and vaginal bleeding.  Musculoskeletal:  Positive for arthralgias and gait problem.  Skin:  Negative for wound.  Neurological:  Positive for weakness. Negative for dizziness and headaches.  Psychiatric/Behavioral:  Positive for dysphoric mood. Negative for confusion and sleep  disturbance. The patient is not nervous/anxious.     Immunization History  Administered Date(s) Administered   Fluad Quad(high Dose 65+) 02/05/2022, 01/08/2023   Influenza Split 01/16/2009, 04/30/2010, 01/26/2016, 02/07/2018, 02/01/2020   Influenza, High Dose Seasonal PF 02/07/2018, 01/25/2019, 02/28/2021, 02/05/2022   Influenza,inj,Quad PF,6+ Mos 03/06/2012, 01/26/2016, 01/10/2017   Influenza,inj,quad, With Preservative 02/14/2015   Moderna Covid-19 Vaccine Bivalent Booster 48yrs & up 02/28/2021, 02/04/2023   PFIZER(Purple Top)SARS-COV-2 Vaccination 05/06/2019, 05/27/2019, 01/27/2020, 09/18/2020   Pneumococcal Conjugate-13 03/19/2017   Pneumococcal Polysaccharide-23 02/14/2015   Respiratory Syncytial Virus Vaccine,Recomb Aduvanted(Arexvy) 02/15/2022   Td 02/25/2005   Tdap 05/19/2013   Zoster, Live 07/28/2006   Pertinent  Health Maintenance Due  Topic Date Due   INFLUENZA VACCINE  Completed   DEXA SCAN  Discontinued      05/07/2022   11:59 AM 08/13/2022   11:02 AM 11/26/2022   12:34 PM 12/31/2022    3:26 PM 03/11/2023    2:48 PM  Fall Risk  Falls in the past year? 0 1 0 0 1  Was there an injury with Fall? 0 0 0 0 0  Fall Risk Category Calculator 0 1 0 0 1  Patient at Risk for Falls Due to No Fall Risks History of fall(s);Impaired balance/gait;Impaired mobility History of fall(s);Impaired balance/gait History of fall(s);Impaired balance/gait;Impaired mobility History of fall(s);Impaired balance/gait;Impaired mobility  Patient at Risk for Falls Due to - Comments  right sided hemiparesis     Fall risk Follow up Falls evaluation completed Falls evaluation completed;Education provided;Falls prevention discussed Falls evaluation completed;Education provided Falls evaluation completed;Education provided;Falls prevention discussed Falls evaluation completed;Education provided;Falls prevention discussed   Functional Status Survey:    Vitals:   05/06/23 1501  BP: (!) 144/74  Pulse: (!)  58  Resp: 16  Temp: (!) 97.3 F (36.3 C)  SpO2: 95%  Weight: 95 lb 6.4 oz (43.3 kg)  Height: 5\' 3"  (1.6 m)   Body mass index is 16.9 kg/m. Physical Exam Vitals reviewed.  Constitutional:      General: She is not in acute distress. HENT:     Head: Normocephalic.  Eyes:     General:        Right eye: No discharge.        Left eye: No discharge.  Cardiovascular:     Rate and Rhythm: Normal rate. Rhythm irregular.     Pulses: Normal pulses.     Heart sounds: Normal heart sounds.  Pulmonary:     Effort: Pulmonary effort is normal.     Breath sounds: Normal breath sounds.  Abdominal:     General: Bowel sounds are normal.  Musculoskeletal:     Cervical back: Neck supple.     Right lower leg: No edema.     Left lower leg: No edema.  Skin:    General: Skin is warm.     Capillary Refill: Capillary refill takes less than 2 seconds.     Comments: Dry skin between nose and lip, lips also dry   Neurological:     General: No focal deficit present.     Mental Status: She is alert and oriented to person, place, and time.     Motor: Weakness present.     Gait: Gait abnormal.     Comments: Right sided weakness  Psychiatric:        Mood and Affect: Mood normal.     Labs reviewed: Recent Labs    03/18/23 0000  NA 140  K 4.4  CL 106  CO2 22  BUN 18  CREATININE 0.8  CALCIUM 10.0   Recent Labs    03/18/23 0000  AST 27  ALT 23  ALKPHOS 96  ALBUMIN 4.2   Recent Labs    09/19/22 0000 03/18/23 0000  WBC 7.4 4.0  HGB 14.0 12.5  HCT 43 38  PLT 246 180   Lab Results  Component Value Date   TSH 0.61 08/08/2022   Lab Results  Component Value Date   HGBA1C 6.0 (H) 11/13/2020   Lab Results  Component Value Date   CHOL 164 03/18/2023   HDL 61 03/18/2023   LDLCALC 67 03/18/2023   TRIG 179 (A) 03/18/2023   CHOLHDL  3.0 11/13/2020    Significant Diagnostic Results in last 30 days:  No results found.  Assessment/Plan 1. Right hemiplegia (HCC) (Primary) -   11/2020 acute left MCA with right sided weakness - followed by Dr. Terrace Arabia - 11/13 Botox injections> plan to continue every 3 months> next 02/10 - cont skilled nursing  2. Pulmonary emphysema, unspecified emphysema type (HCC) - followed by pulmonary - cont stioloto and duonebs  3. Chronic respiratory failure with hypoxia (HCC) - cont oxygen  4. Essential hypertension - controlled with amlodipine  5. Mixed hyperlipidemia - recent LDL 67 - statin intolerance - cont Zetia and low fat diet  6. Chronic diastolic congestive heart failure (HCC) - compensated - cont furosemide prn  7. PAF (paroxysmal atrial fibrillation) (HCC) - concerns for bradycardia> recent HR 40-50's - advised to continue amiodarone per cardiology - cont Eliquis for clot prevention  8. Moderate episode of recurrent major depressive disorder (HCC) - no mood changes - supportive family - cont Wellbutrin  9. Gastroesophageal reflux disease without esophagitis - hgb stable - cont pantoprazole  10. Dry skin - located to skin between nose and lip - cont hydrocortisone/ketoconazole creams - on humidified oxygen - recommend humidifier qhs    Family/ staff Communication: plan discussed with patient and nurse  Labs/tests ordered:  none

## 2023-05-15 ENCOUNTER — Ambulatory Visit: Payer: Medicare Other | Attending: Cardiology | Admitting: Cardiology

## 2023-05-15 ENCOUNTER — Encounter: Payer: Self-pay | Admitting: Cardiology

## 2023-05-15 VITALS — BP 122/68 | HR 43 | Ht 63.0 in | Wt 100.0 lb

## 2023-05-15 DIAGNOSIS — I480 Paroxysmal atrial fibrillation: Secondary | ICD-10-CM | POA: Diagnosis not present

## 2023-05-15 DIAGNOSIS — I421 Obstructive hypertrophic cardiomyopathy: Secondary | ICD-10-CM

## 2023-05-15 DIAGNOSIS — I10 Essential (primary) hypertension: Secondary | ICD-10-CM | POA: Diagnosis not present

## 2023-05-15 NOTE — Patient Instructions (Signed)
Medication Instructions:  Please decrease Amiodarone to 100 mg a day. Continue all other medications as listed.  *If you need a refill on your cardiac medications before your next appointment, please call your pharmacy*  Follow-Up: At [facility], you and your health needs are our priority.  As part of our continuing mission to provide you with exceptional heart care, we have created designated Provider Care Teams.  These Care Teams include your primary Cardiologist (physician) and Advanced Practice Providers (APPs -  Physician Assistants and Nurse Practitioners) who all work together to provide you with the care you need, when you need it.  We recommend signing up for the patient portal called "MyChart".  Sign up information is provided on this After Visit Summary.  MyChart is used to connect with patients for Virtual Visits (Telemedicine).  Patients are able to view lab/test results, encounter notes, upcoming appointments, etc.  Non-urgent messages can be sent to your provider as well.   To learn more about what you can do with MyChart, go to [email or web address].    Your next appointment:   3 - 4 month(s)  Provider:   Donato Schultz, MD

## 2023-05-15 NOTE — Progress Notes (Signed)
Cardiology Office Note:  .   Date:  05/15/2023  ID:  Beverly Li, DOB 03-21-1946, MRN [id number] PCP: [name], MD  [facility] Providers Cardiologist:  Donato Schultz, MD Electrophysiologist:  Sherryl Manges, MD     History of Present Illness: .   Beverly Li is a 78 y.o. female Discussed with the use of AI scribe   History of Present Illness   The patient is a 78 year old female with hypertrophic obstructive cardiomyopathy and paroxysmal atrial fibrillation on amiodarone who presents with bradycardia.  She is currently asymptomatic with no fainting or major issues. Her EKG today showed a slower heart rate in the 40's with intermittent sinus bradycardia. No symptoms such as fainting. 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  She has a history of hypertrophic obstructive cardiomyopathy and paroxysmal atrial fibrillation, currently managed with amiodarone. In 2016, during a femoral popliteal bypass, she experienced significant hypotension and symptoms related to obstructive hypertrophic cardiomyopathy, requiring intubation for several days. In 2018, a cardiac catheterization revealed no coronary disease, but she became bradycardic and hypotensive, requiring phenylephrine. She has a history of tachycardia and arrhythmias, including atrial flutter.  In August 2022, she suffered a left middle cerebral artery stroke treated with thrombectomy, resulting in right hemiparesis. She is undergoing physical therapy and receiving Botox for muscle spasms, which has been effective in improving muscle tone.  She is currently on amiodarone 200 mg daily. She has been stable on this medication for a while, but adjustments are being considered due to changes in her heart rate.          Studies Reviewed: [name]   EKG Interpretation Date/Time:  Thursday May 15 2023 15:58:34 EST Ventricular Rate:  52 PR Interval:    QRS Duration:  102 QT Interval:  482 QTC Calculation: 448 R Axis:   46  Text Interpretation: Sinus bradycardia with variable condution Minimal voltage criteria for LVH, may be normal variant ( Cornell product ) Septal infarct , age undetermined ST & T wave abnormality, consider lateral ischemia When compared with ECG of 06-Jul-2021 15:39, one to one conduction is no longer present Confirmed by Donato Schultz ([id number]) on 05/15/2023 4:10:00 PM    Results   DIAGNOSTIC Cardiac catheterization: No coronary artery disease (2018) Thrombectomy: Complicated by right hemiparesis (11/2020) EKG: Sinus bradycardia with intermittent conduction (05/15/2023)     Risk Assessment/Calculations:            Physical Exam:   VS:  BP 122/68 (Cuff Size: Normal)   Pulse (!) 43   Ht 5\' 3"  (1.6 m)   Wt 100 lb (45.4 kg)   LMP  (LMP Unknown)   SpO2 97%   BMI 17.71 kg/m    Wt Readings from Last 3 Encounters:  05/15/23 100 lb (45.4 kg)  05/06/23 95 lb 6.4 oz (43.3 kg)  03/31/23 93 lb (42.2 kg)    GEN: Thin in wheelchair, alert, dysphagia, right hemiparesis.  NECK: No JVD; No carotid bruits CARDIAC: brady with ectopy, no murmurs, no rubs, no gallops RESPIRATORY:  Clear to auscultation without rales, wheezing or rhonchi  ABDOMEN: Soft, non-tender, non-distended EXTREMITIES:  No edema; No deformity   ASSESSMENT AND PLAN: .    Assessment and Plan    Sinus Bradycardia Intermittent conduction of sinus bradycardia with junctional escape with heart rates in the forties. No symptoms of syncope or significant issues. EKG shows  variable conduction. Previous discontinuation of metoprolol due to bradycardia. Current amiodarone therapy may be contributing to bradycardia. Discussed reducing amiodarone to manage atrial fibrillation while potentially increasing heart rate. Pacemaker not needed currently due to lack of symptoms and desire to avoid additional  procedures. - Reduce amiodarone dose from 200 mg to 100 mg daily - Monitor for symptoms of syncope or significant bradycardia - Follow-up EKG in 3-4 months  Paroxysmal Atrial Fibrillation Managed with amiodarone. Previous episodes of tachycardia and atrial flutter. Risk of recurrence if amiodarone is discontinued. Discussed potential for heart rate increase with reduced amiodarone dose and importance of monitoring for recurrence of tachycardia. - Continue amiodarone at reduced dose (100 mg daily)  Hypertrophic Obstructive Cardiomyopathy Previous episodes of significant hypotension and bradycardia. No current symptoms. Pacemaker not needed currently due to lack of symptoms and desire to avoid additional procedures. - Monitor for symptoms  Stroke Left middle cerebral artery stroke in August 2022 treated with thrombectomy, complicated by right hemiparesis. No new neurological symptoms. Continuing physical therapy has shown improvement in right hemiparesis. - Continue current management and physical therapy for right hemiparesis  Follow-up - Schedule follow-up appointment in 3-4 months for EKG and clinical evaluation.              Signed, Donato Schultz, MD

## 2023-05-20 ENCOUNTER — Encounter: Payer: Self-pay | Admitting: Orthopedic Surgery

## 2023-05-20 ENCOUNTER — Non-Acute Institutional Stay (SKILLED_NURSING_FACILITY): Payer: Medicare Other | Admitting: Orthopedic Surgery

## 2023-05-20 DIAGNOSIS — R1033 Periumbilical pain: Secondary | ICD-10-CM

## 2023-05-20 DIAGNOSIS — B3731 Acute candidiasis of vulva and vagina: Secondary | ICD-10-CM

## 2023-05-20 MED ORDER — METHOCARBAMOL 500 MG PO TABS: 250.0000 mg | ORAL_TABLET | Freq: Two times a day (BID) | ORAL | Status: AC | PRN

## 2023-05-20 NOTE — Progress Notes (Signed)
 Location:  [facility] Room Number: 116/A Place of Service:  SNF [phone]) Provider:  Greig FORBES Cluster, NP   [name], MD  Patient Care Team: [name], MD as PCP - General (Internal Medicine) Jeffrie Oneil BROCKS, MD as PCP - Cardiology (Cardiology) [name], MD as PCP - Electrophysiology (Cardiology) Alix Charleston, MD as Consulting Physician (Neurosurgery)  Extended Emergency Contact Information Primary Emergency Contact: Uselman,[name] Address: [address]          [place] [phone] [place] Home Phone: [phone] Mobile Phone: [phone] Relation: Spouse Secondary Emergency Contact: Ryer,[name] Address: [address]          [place], [place] [address] [place] Mobile Phone: [phone] Relation: Son  Code Status: Full code Goals of care: Advanced Directive information    03/31/2023    5:02 PM  Advanced Directives  Does Patient Have a Medical Advance Directive? Yes  Would patient like information on creating a medical advance directive? No - Patient declined     Chief Complaint  Patient presents with   Acute Visit    Vaginal and lower abdominal discomfort    HPI:  Pt is a Beverly Li seen today for acute visit due to vaginal and abdominal discomfort.   She currently resides on the skilled nursing unit at [facility]. 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   Increased vaginal and abdominal discomfort x 1 day. Vaginal discomfort described as itching/burning. H/o yeast infections in past. She is incontinent. Abdominal pain located to middle/lower abdomen. Described as intermittent cramping. LBM 02/03, described as large amount, brown and solid per nursing. H/o gallstones per US  11/30/2021. Remains on daily miralax  and senna. Afebrile. Vitals stable.      Past Medical History:  Diagnosis Date   Anxiety    Arthritis    some in my lower back; probably elbows, knees (11/18/2017)   Atrial fibrillation (HCC)    Bell's palsy    when pt. was 78 yrs old, when under stress the left side of face will droop.   Complication of anesthesia    vascular OR 2016; BP bottomed out; couldn't get it regulated; ended up in ICU for DAYS (11/18/2017)   GERD (gastroesophageal reflux disease)    History of kidney stones    Hypertension    Hypertrophic cardiomyopathy (HCC)    severe LV basilar hypertrophy witn no evidence of significant outflow tract obstruction, EF 65-70%, mild LAE, mild TR, grade 1a diastolic dysfunction 05/15/10 (Dr. Oneil Jeffrie) (Atrial Septal Hypertrophy pattern)-- Intra-op TEE with dsignificant outflow tract obstruction - AI, MR & TR   Insomnia    Mild aortic sclerosis    Osteopenia    Peripheral vascular disease (HCC)    Syncope    , Vagal   Past Surgical History:  Procedure Laterality Date   AUGMENTATION MAMMAPLASTY Bilateral    BACK SURGERY     CARDIAC CATHETERIZATION N/A 05/07/2016   Procedure: Left Heart Cath and Coronary Angiography;  Surgeon: Lonni JONETTA Cash, MD;  Location: [facility] INVASIVE CV LAB;  Service: Cardiovascular;  Laterality: N/A;   CARDIOVERSION N/A 09/24/2017   Procedure: CARDIOVERSION;  Surgeon: Rolan Ezra RAMAN, MD;  Location: [facility] ENDOSCOPY;  Service: Cardiovascular;  Laterality: N/A;   DILATION AND CURETTAGE OF UTERUS     ENDARTERECTOMY FEMORAL Right 03/02/2015   Procedure: ENDARTERECTOMY  RIGHT FEMORAL;  Surgeon: Lonni GORMAN Blade, MD;  Location: [facility] OR;  Service: Vascular;  Laterality: Right;   ESOPHAGOGASTRODUODENOSCOPY (EGD) WITH PROPOFOL  N/A 12/01/2020   Procedure: ESOPHAGOGASTRODUODENOSCOPY (EGD) WITH PROPOFOL ;  Surgeon: Paola Dreama SAILOR, MD;  Location: [facility] ENDOSCOPY;  Service: General;  Laterality: N/A;   FACIAL COSMETIC SURGERY Left 2002   related to Bell's Palsy @ age 70; left eye/side of face  droopy; tried to make area symmetrical   FEMORAL-POPLITEAL BYPASS GRAFT Right 03/02/2015   Procedure: BYPASS GRAFT FEMORAL-BELOW KNEE POPLITEAL ARTERY;  Surgeon: Lonni GORMAN Blade, MD;  Location: [facility] OR;  Service: Vascular;  Laterality: Right;   INGUINAL HERNIA REPAIR Bilateral 2002   IR ANGIO INTRA EXTRACRAN SEL COM CAROTID INNOMINATE UNI L MOD SED  11/16/2020   IR CT HEAD LTD  11/13/2020   IR PERCUTANEOUS ART THROMBECTOMY/INFUSION INTRACRANIAL INC DIAG ANGIO  11/13/2020   OVARIAN CYST REMOVAL Left    PEG PLACEMENT N/A 12/01/2020   Procedure: PERCUTANEOUS ENDOSCOPIC GASTROSTOMY (PEG) PLACEMENT;  Surgeon: Paola Dreama SAILOR, MD;  Location: [facility] ENDOSCOPY;  Service: General;  Laterality: N/A;   PERIPHERAL VASCULAR CATHETERIZATION N/A 01/16/2015   Procedure: Abdominal Aortogram;  Surgeon: Lonni GORMAN Blade, MD;  Location: [facility] INVASIVE CV LAB;  Service: Cardiovascular;  Laterality: N/A;   POSTERIOR LUMBAR FUSION  2015   have plates and screws in there   RADIOLOGY WITH ANESTHESIA N/A 11/13/2020   Procedure: IR WITH ANESTHESIA;  Surgeon: Radiologist, Medication, MD;  Location: [facility] OR;  Service: Radiology;  Laterality: N/A;   TONSILLECTOMY      Allergies  Allergen Reactions   Amoxicillin Other (See Comments)    UTI Has patient had a PCN reaction causing immediate rash, facial/tongue/throat swelling, SOB or lightheadedness with hypotension: No Has patient had a PCN reaction causing severe rash involving mucus membranes or skin necrosis: No Has patient had a PCN reaction that required hospitalization: No Has patient had a PCN reaction occurring within the last 10 years: Yes--UTI ONLY If all of the above answers are NO, then may proceed with Cephalosporin use.    Atenolol Cough   Crestor  [Rosuvastatin  Calcium ] Other (See Comments)    Muscle aches   Pravastatin  Other (See Comments)    Muscle aches   Rosuvastatin  Calcium     Sulfa Antibiotics Nausea Only   Codeine Other (See Comments)     hallucinations    Outpatient Encounter Medications as of 05/20/2023  Medication Sig   acetaminophen  (TYLENOL ) 325 MG tablet Take 650 mg by mouth every 6 (six) hours as needed for mild pain or fever.   albuterol  (VENTOLIN  HFA) 108 (90 Base) MCG/ACT inhaler Inhale 2 puffs into the lungs every 6 (six) hours as needed for wheezing or shortness of breath.   amiodarone  (PACERONE ) 200 MG tablet Take 100 mg by mouth daily. May give crushed   amLODipine  (NORVASC ) 5 MG tablet Take 2.5 mg by mouth daily.   apixaban  (ELIQUIS ) 5 MG TABS tablet Take 5 mg by mouth 2 (two) times daily. May give crushed   bisacodyl  (DULCOLAX) 5 MG EC tablet Take 5 mg by mouth 2 (two) times daily as needed for moderate constipation.   buPROPion  (WELLBUTRIN  XL) 150 MG 24 hr tablet Take 150 mg by mouth daily.   diclofenac Sodium (VOLTAREN) 1 % GEL Apply one application to right hip and knee four times daily as needed.   Emollient (CERAVE) CREA Apply 1 Application topically as needed.   ezetimibe  (ZETIA ) 10 MG tablet Take 10 mg by mouth daily.  Oral if crushed   furosemide  (LASIX ) 20 MG tablet Take 20 mg by mouth daily as needed. Give Lasix  20mg  for weight gain equal or greater than 3 pounds   hydrALAZINE  (APRESOLINE ) 10 MG tablet Take 10 mg by mouth 3 (three) times daily as needed (SBP > 165 or DBP > 95).   hydrocortisone cream 1 % Apply 1 Application topically 2 (two) times daily as needed for itching.   ipratropium-albuterol  (DUONEB) 0.5-2.5 (3) MG/3ML SOLN Inhale 3 mLs into the lungs every 4 (four) hours as needed.   ketoconazole  (NIZORAL ) 2 % cream Apply 1 application  topically daily as needed for irritation.   nystatin cream (MYCOSTATIN) Apply 1 application topically 2 (two) times daily as needed for dry skin (apply to perirectal rash).   ondansetron  (ZOFRAN ) 4 MG tablet Take 4 mg by mouth every 6 (six) hours as needed for nausea or vomiting.   OXYGEN  Inhale 3 L into the lungs as directed. to maintain sats > or equal to 90%.  May titrate Three Times A Day; 07:00 AM - 03:00 PM, 03:00 PM - 11:00 PM, 11:00 PM - 7:00 am   pantoprazole  (PROTONIX ) 40 MG tablet Take 40 mg by mouth 2 (two) times daily.   Polyethyl Glycol-Propyl Glycol (SYSTANE) 0.4-0.3 % SOLN Apply 2 drops to eye every 6 (six) hours as needed (dry eyes).   polyethylene glycol powder (GLYCOLAX /MIRALAX ) 17 GM/SCOOP powder Take 17 g by mouth daily.   sennosides-docusate sodium  (SENOKOT-S) 8.6-50 MG tablet Take 1 tablet by mouth daily.   Tiotropium Bromide-Olodaterol (STIOLTO RESPIMAT ) 2.5-2.5 MCG/ACT AERS Inhale 2 puffs into the lungs daily.   Facility-Administered Encounter Medications as of 05/20/2023  Medication   botulinum toxin Type A  (BOTOX ) injection 100 Units    Review of Systems  Constitutional:  Negative for fever.  HENT:  Negative for congestion and sore throat.   Cardiovascular:  Negative for chest pain and leg swelling.  Gastrointestinal:  Positive for abdominal pain and constipation. Negative for abdominal distention, blood in stool, diarrhea, nausea, rectal pain and vomiting.  Genitourinary:  Positive for vaginal discharge and vaginal pain.  Musculoskeletal:  Positive for gait problem.  Psychiatric/Behavioral:  Negative for dysphoric mood. The patient is not nervous/anxious.     Immunization History  Administered Date(s) Administered   Fluad Quad(high Dose 65+) 02/05/2022, 01/08/2023   Influenza Split 01/16/2009, 04/30/2010, 01/26/2016, 02/07/2018, 02/01/2020   Influenza, High Dose Seasonal PF 02/07/2018, 01/25/2019, 02/28/2021, 02/05/2022   Influenza,inj,Quad PF,6+ Mos 03/06/2012, 01/26/2016, 01/10/2017   Influenza,inj,quad, With Preservative 02/14/2015   Moderna Covid-19 Vaccine Bivalent Booster 77yrs & up 02/28/2021, 02/04/2023   PFIZER(Purple Top)SARS-COV-2 Vaccination 05/06/2019, 05/27/2019, 01/27/2020, 09/18/2020   Pneumococcal Conjugate-13 03/19/2017   Pneumococcal Polysaccharide-23 02/14/2015   Respiratory Syncytial Virus  Vaccine,Recomb Aduvanted(Arexvy) 02/15/2022   Td 02/25/2005   Tdap 05/19/2013   Zoster, Live 07/28/2006   Pertinent  Health Maintenance Due  Topic Date Due   INFLUENZA VACCINE  Completed   DEXA SCAN  Discontinued      05/07/2022   11:59 AM 08/13/2022   11:02 AM 11/26/2022   12:34 PM 12/31/2022    3:26 PM 03/11/2023    2:48 PM  Fall Risk  Falls in the past year? 0 1 0 0 1  Was there an injury with Fall? 0 0 0 0 0  Fall Risk Category Calculator 0 1 0 0 1  Patient at Risk for Falls Due to No Fall Risks History of fall(s);Impaired balance/gait;Impaired mobility History of fall(s);Impaired balance/gait History of fall(s);Impaired  balance/gait;Impaired mobility History of fall(s);Impaired balance/gait;Impaired mobility  Patient at Risk for Falls Due to - Comments  right sided hemiparesis     Fall risk Follow up Falls evaluation completed Falls evaluation completed;Education provided;Falls prevention discussed Falls evaluation completed;Education provided Falls evaluation completed;Education provided;Falls prevention discussed Falls evaluation completed;Education provided;Falls prevention discussed   Functional Status Survey:    Vitals:   05/20/23 1242  BP: 116/66  Pulse: 61  Resp: 16  Temp: 97.7 F (36.5 C)  SpO2: 96%  Weight: 95 lb 6.4 oz (43.3 kg)  Height: 5' 3 (1.6 m)   Body mass index is 16.9 kg/m. Physical Exam Vitals reviewed. Exam conducted with a chaperone present.  Constitutional:      General: She is not in acute distress. HENT:     Head: Normocephalic.  Eyes:     General:        Right eye: No discharge.        Left eye: No discharge.  Cardiovascular:     Rate and Rhythm: Normal rate and regular rhythm.     Pulses: Normal pulses.     Heart sounds: Normal heart sounds.  Pulmonary:     Effort: No respiratory distress.     Breath sounds: Normal breath sounds. No wheezing or rales.  Abdominal:     General: Bowel sounds are normal. There is no distension.      Palpations: Abdomen is soft. There is no mass.     Tenderness: There is no abdominal tenderness. There is no guarding or rebound.     Hernia: No hernia is present.  Genitourinary:    General: Normal vulva.     Vagina: Vaginal discharge and erythema present.  Musculoskeletal:     Cervical back: Neck supple.     Right lower leg: No edema.     Left lower leg: No edema.  Skin:    General: Skin is warm.     Capillary Refill: Capillary refill takes less than 2 seconds.  Neurological:     General: No focal deficit present.     Mental Status: She is alert. Mental status is at baseline.     Gait: Gait abnormal.     Comments: Right hemiparesis  Psychiatric:        Mood and Affect: Mood normal.     Labs reviewed: Recent Labs    03/18/23 0000  NA 140  K 4.4  CL 106  CO2 22  BUN 18  CREATININE 0.8  CALCIUM  10.0   Recent Labs    03/18/23 0000  AST 27  ALT 23  ALKPHOS 96  ALBUMIN  4.2   Recent Labs    09/19/22 0000 03/18/23 0000  WBC 7.4 4.0  HGB 14.0 12.5  HCT 43 38  PLT 246 180   Lab Results  Component Value Date   TSH 0.61 08/08/2022   Lab Results  Component Value Date   HGBA1C 6.0 (H) 11/13/2020   Lab Results  Component Value Date   CHOL 164 03/18/2023   HDL 61 03/18/2023   LDLCALC 67 03/18/2023   TRIG 179 (A) 03/18/2023   CHOLHDL 3.0 11/13/2020    Significant Diagnostic Results in last 30 days:  No results found.  Assessment/Plan 1. Vaginal yeast infection (Primary) - mild erythema and discharge - increased risk due to incontinence - start diflucan  150 mg po once  2. Periumbilical abdominal pain - onset x 1 day - noted to middle and lower abdomen, described as cramping - exam unremarkable, afebrile -  LBM 02/03> normal - on daily miralax  and senna - h/o gallstones per US  11/30/2021 - start robaxin  250 mg po BID prn x 5 days - cont heating pad - cbc/diff and cmp 02/05 - consider KUB if symptoms persist    Family/ staff Communication: plan  discussed with patient and nurse  Labs/tests ordered:  cbc/diff, cmp 02/05

## 2023-05-21 DIAGNOSIS — Z79899 Other long term (current) drug therapy: Secondary | ICD-10-CM | POA: Diagnosis not present

## 2023-05-21 LAB — BASIC METABOLIC PANEL
BUN: 24 — AB (ref 4–21)
CO2: 25 — AB (ref 13–22)
Chloride: 107 (ref 99–108)
Creatinine: 1 (ref 0.5–1.1)
Glucose: 79
Potassium: 4.2 meq/L (ref 3.5–5.1)
Sodium: 142 (ref 137–147)

## 2023-05-21 LAB — CBC AND DIFFERENTIAL
HCT: 41 (ref 36–46)
Hemoglobin: 13.6 (ref 12.0–16.0)
Platelets: 246 10*3/uL (ref 150–400)
WBC: 7.2

## 2023-05-21 LAB — COMPREHENSIVE METABOLIC PANEL
Albumin: 3.4 — AB (ref 3.5–5.0)
Calcium: 8.9 (ref 8.7–10.7)
Globulin: 2.4
eGFR: 59

## 2023-05-21 LAB — HEPATIC FUNCTION PANEL
ALT: 35 U/L (ref 7–35)
AST: 30 (ref 13–35)
Alkaline Phosphatase: 93 (ref 25–125)
Bilirubin, Total: 0.3

## 2023-05-21 LAB — CBC: RBC: 4.6 (ref 3.87–5.11)

## 2023-05-26 ENCOUNTER — Ambulatory Visit: Payer: Medicare Other | Admitting: Neurology

## 2023-05-26 DIAGNOSIS — G8111 Spastic hemiplegia affecting right dominant side: Secondary | ICD-10-CM

## 2023-05-26 DIAGNOSIS — G8191 Hemiplegia, unspecified affecting right dominant side: Secondary | ICD-10-CM

## 2023-05-26 MED ORDER — ONABOTULINUMTOXINA 200 UNITS IJ SOLR
200.0000 [IU] | Freq: Once | INTRAMUSCULAR | Status: AC
  Administered 2023-05-26: 200 [IU] via INTRAMUSCULAR

## 2023-05-26 NOTE — Progress Notes (Signed)
 Chief Complaint  Patient presents with   Injections    Pt in 14, Pt is here for injections.       ASSESSMENT AND PLAN  Beverly Li is a 78 y.o. 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  Stroke happened while she was off Eliquis  for 1 week for abdominal hematoma, with history of atrial fibrillation  Now with residual aphasia, right hemiparesis,  Electrical stimulation guided Botox  a injection for spastic right upper extremity (100 units of Botox  a dissolving to 2 cc of normal saline), 1st injection was on May 22 2022  Right pectoralis major 50 units Right latissimus dorsi 50 units Right teres major 25 units Right flexor digitorum profundus 25  units Right pronator teres 25 units Right biceps 25 units  units    DIAGNOSTIC DATA (LABS, IMAGING, TESTING) - I reviewed patient records, labs, notes, testing and imaging myself where available.   MEDICAL HISTORY:  Beverly Li is a 78 year old female, accompanied by her husband, seen in request by his primary care at [facility] Dr. Joanell Mowers for evaluation of botulism toxin injection for spastic right hemiparesis, she is also seen by our clinic for stroke follow-up   I reviewed and summarized the referring note.PMHx. A fib,  Eliquis  HTN Peripheral vascular disease. COPD   She suffered stroke on November 13, 2020 presenting with right-sided weakness, right facial droop, left gaze preference,  MRI showed left ACA/MCA stroke, status post IR with TICI, reperfusion of left MCA, in the setting of paroxysmal atrial fibrillation taking off Eliquis  for 1 week due to abdominal hematoma  CT angiogram head and neck and four-vessel angiogram postprocedure showed status post revascularization of the occluded left middle cerebral artery M1 segment, and proximal flow arrest achieved TICI 3 revascularization, severe preocclusive stenosis of the proximal right internal carotid artery associated with a delayed strain signs,  Known  history of peripheral vascular disease, CT angiogram November 25, 2020 suspicious for occlusion of distal right femoral-popliteal bypass, low cardiac output, and distal high-grade stenosis/resistance also contributed to the unopacified distal bypass conduit, Absent of right side tibial arterial opacification, moderate to advanced aortic atherosclerosis bilateral iliac arterial disease, with no evidence of high-grade aortoiliac disease.  Echocardiogram in March 2023, ejection fraction 60 to 65%, no regional wall motion abnormality, moderate asymmetric left ventricular hypertrophy of the basal septal segment  She is now at [facility], with right hemiparesis, aphasia, husband reported that she continued to make small progress even now, physical therapy have suggested botulism toxin injection to alleviate her right shoulder and arm weakness   UPDATE May 22 2022: She is accompanied by her husband, receiving first electrical stimulation guided Botox  injection for spastic right upper extremity, use of Botox  a 100 units x 2, potential side effect explained,  UPDATE Aug 21 2022: She responded well to previous injection, there was no significant side effect noticed, tolerating Botox  a 100 units x 2,  UPDATE August 14th 2024: She is accompanied by her husband at today's clinical visit, overall doing well, no side effect noted, every 3 months Botox  injection has relaxed right shoulder, elbow,  UPDATE Feb 26 2023: Previous injection has been helpful, no side effect noted.  UPDATE May 26 2023: She tolerated the injection well, has better range of motion of right arm  PHYSICAL EXAM:      05/20/2023   12:42 PM 05/15/2023    3:53 PM 05/06/2023    3:01 PM  Vitals with BMI  Height 5\' 3"  5\' 3"  5\' 3"   Weight 95 lbs 6 oz 100 lbs 95 lbs 6 oz  BMI 16.9 17.72 16.9  Systolic 116 122 045  Diastolic 66 68 74  Pulse 61 43 58     PHYSICAL EXAMNIATION:  Gen: NAD, conversant, well nourised, well groomed                      Cardiovascular: Regular rate rhythm, no peripheral edema, warm, nontender. Eyes: Conjunctivae clear without exudates or hemorrhage Neck: Supple, no carotid bruits. Pulmonary: Clear to auscultation bilaterally   NEUROLOGICAL EXAM:  MENTAL STATUS: Speech/cognition: Global aphasia, paraphasic errors, slow in comprehension, word finding difficulties CRANIAL NERVES: CN II: Visual fields are full to confrontation. Pupils are round equal and briskly reactive to light. CN III, IV, VI: extraocular movement are normal. No ptosis. CN V: Facial sensation is intact to light touch CN VII: Right lower face weakness CN VIII: Hearing is normal to causal conversation. CN IX, X: Phonation is normal. CN XI: Head turning and shoulder shrug are intact  MOTOR: Right hemiparesis, mild spasticity of right upper extremity, no  right upper extremity antigravity movement, good range of motion of right shoulder, elbow, tight right pectoralis muscle, latissimus dorsi, tendency for right thumb in  REVIEW OF SYSTEMS:  Full 14 system review of systems performed and notable only for as above All other review of systems were negative.   ALLERGIES: Allergies  Allergen Reactions   Amoxicillin Other (See Comments)    UTI Has patient had a PCN reaction causing immediate rash, facial/tongue/throat swelling, SOB or lightheadedness with hypotension: No Has patient had a PCN reaction causing severe rash involving mucus membranes or skin necrosis: No Has patient had a PCN reaction that required hospitalization: No Has patient had a PCN reaction occurring within the last 10 years: Yes--UTI ONLY If all of the above answers are "NO", then may proceed with Cephalosporin use.    Atenolol Cough   Crestor  [Rosuvastatin  Calcium ] Other (See Comments)    Muscle aches   Pravastatin  Other (See Comments)    Muscle aches   Rosuvastatin  Calcium     Sulfa Antibiotics Nausea Only   Codeine Other (See Comments)     hallucinations    HOME MEDICATIONS: Current Outpatient Medications  Medication Sig Dispense Refill   acetaminophen  (TYLENOL ) 325 MG tablet Take 650 mg by mouth every 6 (six) hours as needed for mild pain or fever.     albuterol  (VENTOLIN  HFA) 108 (90 Base) MCG/ACT inhaler Inhale 2 puffs into the lungs every 6 (six) hours as needed for wheezing or shortness of breath. 8 g 6   amiodarone  (PACERONE ) 200 MG tablet Take 100 mg by mouth daily. May give crushed     amLODipine  (NORVASC ) 5 MG tablet Take 2.5 mg by mouth daily.     apixaban  (ELIQUIS ) 5 MG TABS tablet Take 5 mg by mouth 2 (two) times daily. May give crushed     bisacodyl  (DULCOLAX) 5 MG EC tablet Take 5 mg by mouth 2 (two) times daily as needed for moderate constipation.     buPROPion  (WELLBUTRIN  XL) 150 MG 24 hr tablet Take 150 mg by mouth daily.     diclofenac Sodium (VOLTAREN) 1 % GEL Apply one application to right hip and knee four times daily as needed.     Emollient (CERAVE) CREA Apply 1 Application topically as needed.     ezetimibe  (ZETIA ) 10 MG tablet Take 10 mg by mouth daily.  Oral if crushed     furosemide  (LASIX ) 20 MG tablet Take 20 mg by mouth daily as needed. Give Lasix  20mg  for weight gain equal or greater than 3 pounds     hydrALAZINE  (APRESOLINE ) 10 MG tablet Take 10 mg by mouth 3 (three) times daily as needed (SBP > 165 or DBP > 95).     hydrocortisone cream 1 % Apply 1 Application topically 2 (two) times daily as needed for itching.     ipratropium-albuterol  (DUONEB) 0.5-2.5 (3) MG/3ML SOLN Inhale 3 mLs into the lungs every 4 (four) hours as needed.     ketoconazole  (NIZORAL ) 2 % cream Apply 1 application  topically daily as needed for irritation.     nystatin cream (MYCOSTATIN) Apply 1 application topically 2 (two) times daily as needed for dry skin (apply to perirectal rash).     ondansetron  (ZOFRAN ) 4 MG tablet Take 4 mg by mouth every 6 (six) hours as needed for nausea or vomiting.     OXYGEN  Inhale 3 L into the  lungs as directed. to maintain sats > or equal to 90%. May titrate Three Times A Day; 07:00 AM - 03:00 PM, 03:00 PM - 11:00 PM, 11:00 PM - 7:00 am     pantoprazole  (PROTONIX ) 40 MG tablet Take 40 mg by mouth 2 (two) times daily.     Polyethyl Glycol-Propyl Glycol (SYSTANE) 0.4-0.3 % SOLN Apply 2 drops to eye every 6 (six) hours as needed (dry eyes).     polyethylene glycol powder (GLYCOLAX /MIRALAX ) 17 GM/SCOOP powder Take 17 g by mouth daily.     sennosides-docusate sodium  (SENOKOT-S) 8.6-50 MG tablet Take 1 tablet by mouth daily.     Tiotropium Bromide-Olodaterol (STIOLTO RESPIMAT ) 2.5-2.5 MCG/ACT AERS Inhale 2 puffs into the lungs daily.     Current Facility-Administered Medications  Medication Dose Route Frequency Provider Last Rate Last Admin   botulinum toxin Type A  (BOTOX ) injection 100 Units  100 Units Intramuscular Once Phebe Brasil, MD       botulinum toxin Type A  (BOTOX ) injection 200 Units  200 Units Intramuscular Once Phebe Brasil, MD        PAST MEDICAL HISTORY: Past Medical History:  Diagnosis Date   Anxiety    Arthritis    "some in my lower back; probably elbows, knees" (11/18/2017)   Atrial fibrillation (HCC)    Bell's palsy    when pt. was 77 yrs old, when under stress the left side of face will droop.   Complication of anesthesia    "vascular OR 2016; BP bottomed out; couldn't get it regulated; ended up in ICU for DAYS" (11/18/2017)   GERD (gastroesophageal reflux disease)    History of kidney stones    Hypertension    Hypertrophic cardiomyopathy (HCC)    severe LV basilar hypertrophy witn no evidence of significant outflow tract obstruction, EF 65-70%, mild LAE, mild TR, grade 1a diastolic dysfunction 05/15/10 (Dr. Dorothye Gathers) (Atrial Septal Hypertrophy pattern)-- Intra-op TEE with dsignificant outflow tract obstruction - AI, MR & TR   Insomnia    Mild aortic sclerosis    Osteopenia    Peripheral vascular disease (HCC)    Syncope    , Vagal    PAST SURGICAL  HISTORY: Past Surgical History:  Procedure Laterality Date   AUGMENTATION MAMMAPLASTY Bilateral    BACK SURGERY     CARDIAC CATHETERIZATION N/A 05/07/2016   Procedure: Left Heart Cath and Coronary Angiography;  Surgeon: Odie Benne, MD;  Location: [facility] INVASIVE CV LAB;  Service: Cardiovascular;  Laterality: N/A;   CARDIOVERSION N/A 09/24/2017   Procedure: CARDIOVERSION;  Surgeon: Darlis Eisenmenger, MD;  Location: [facility] ENDOSCOPY;  Service: Cardiovascular;  Laterality: N/A;   DILATION AND CURETTAGE OF UTERUS     ENDARTERECTOMY FEMORAL Right 03/02/2015   Procedure: ENDARTERECTOMY RIGHT FEMORAL;  Surgeon: Dannis Dy, MD;  Location: [facility] OR;  Service: Vascular;  Laterality: Right;   ESOPHAGOGASTRODUODENOSCOPY (EGD) WITH PROPOFOL  N/A 12/01/2020   Procedure: ESOPHAGOGASTRODUODENOSCOPY (EGD) WITH PROPOFOL ;  Surgeon: Anda Bamberg, MD;  Location: [facility] ENDOSCOPY;  Service: General;  Laterality: N/A;   FACIAL COSMETIC SURGERY Left 2002   "related to Bell's Palsy @ age 29; left eye/side of face droopy; tried to make area symmetrical"   FEMORAL-POPLITEAL BYPASS GRAFT Right 03/02/2015   Procedure: BYPASS GRAFT FEMORAL-BELOW KNEE POPLITEAL ARTERY;  Surgeon: Dannis Dy, MD;  Location: [facility] OR;  Service: Vascular;  Laterality: Right;   INGUINAL HERNIA REPAIR Bilateral 2002   IR ANGIO INTRA EXTRACRAN SEL COM CAROTID INNOMINATE UNI L MOD SED  11/16/2020   IR CT HEAD LTD  11/13/2020   IR PERCUTANEOUS ART THROMBECTOMY/INFUSION INTRACRANIAL INC DIAG ANGIO  11/13/2020   OVARIAN CYST REMOVAL Left    PEG PLACEMENT N/A 12/01/2020   Procedure: PERCUTANEOUS ENDOSCOPIC GASTROSTOMY (PEG) PLACEMENT;  Surgeon: Anda Bamberg, MD;  Location: [facility] ENDOSCOPY;  Service: General;  Laterality: N/A;   PERIPHERAL VASCULAR CATHETERIZATION N/A 01/16/2015   Procedure: Abdominal Aortogram;  Surgeon: Dannis Dy, MD;  Location: [facility] INVASIVE CV LAB;  Service: Cardiovascular;  Laterality: N/A;   POSTERIOR LUMBAR  FUSION  2015   "have plates and screws in there"   RADIOLOGY WITH ANESTHESIA N/A 11/13/2020   Procedure: IR WITH ANESTHESIA;  Surgeon: Radiologist, Medication, MD;  Location: [facility] OR;  Service: Radiology;  Laterality: N/A;   TONSILLECTOMY      FAMILY HISTORY: Family History  Problem Relation Age of Onset   Liver cancer Mother    Cancer Mother        Liver   Hypertension Mother    Lung cancer Father    Cancer Father        Lung   Breast cancer Sister    Cancer Sister        Breast    SOCIAL HISTORY: Social History   Socioeconomic History   Marital status: Married    Spouse name: Optometrist   Number of children: Not on file   Years of education: Not on file   Highest education level: Not on file  Occupational History   Not on file  Tobacco Use   Smoking status: Former    Current packs/day: 0.00    Average packs/day: 1 pack/day for 50.0 years (50.0 ttl pk-yrs)    Types: Cigarettes    Start date: 12/14/1964    Quit date: 12/15/2014    Years since quitting: 8.4   Smokeless tobacco: Never  Vaping Use   Vaping status: Never Used  Substance and Sexual Activity   Alcohol  use: Yes    Comment: 11/18/2017 "might have a couple glasses of wine/month; if that"   Drug use: Not on file   Sexual activity: Not Currently  Other Topics Concern   Not on file  Social History Narrative   11/21/21 living at [facility]   Social Drivers of Health   Financial Resource Strain: Low Risk  (08/13/2022)   Overall Financial Resource Strain (CARDIA)    Difficulty of Paying Living Expenses: Not hard at all  Food Insecurity:  No Food Insecurity (08/13/2022)   Hunger Vital Sign    Worried About Running Out of Food in the Last Year: Never true    Ran Out of Food in the Last Year: Never true  Transportation Needs: No Transportation Needs (08/13/2022)   PRAPARE - Administrator, Civil Service (Medical): No    Lack of Transportation (Non-Medical): No  Physical Activity: Insufficiently Active  (08/13/2022)   Exercise Vital Sign    Days of Exercise per Week: 3 days    Minutes of Exercise per Session: 30 min  Stress: No Stress Concern Present (08/13/2022)   [employer] of Occupational Health - Occupational Stress Questionnaire    Feeling of Stress : Only a little  Social Connections: Moderately Isolated (08/13/2022)   Social Connection and Isolation Panel [NHANES]    Frequency of Communication with Friends and Family: Patient unable to answer    Frequency of Social Gatherings with Friends and Family: More than three times a week    Attends Religious Services: Never    Database administrator or Organizations: No    Attends Banker Meetings: Never    Marital Status: Married  Catering manager Violence: Not At Risk (08/13/2022)   Humiliation, Afraid, Rape, and Kick questionnaire    Fear of Current or Ex-Partner: No    Emotionally Abused: No    Physically Abused: No    Sexually Abused: No      Phebe Brasil, M.D. Ph.D.  [facility] [address] [place], [place] [address] Ph: [phone] Fax: [phone]  CC:  Marguerite Shiley, MD [address] [place],  [place] [address]  Marguerite Shiley, MD

## 2023-05-26 NOTE — Progress Notes (Signed)
 Botox - 200 units x 1 vial Lot: [id number] Expiration: 07/2025 NDC: 5621-3086-57  Bacteriostatic 0.9% Sodium Chloride - 4 mL  Lot: [id number] Expiration: 02/14/2024 NDC: 9528-4132-44  Dx: G81.91 B/B Witnessed by Ralston Burkes CMA

## 2023-05-27 ENCOUNTER — Encounter: Payer: Self-pay | Admitting: Orthopedic Surgery

## 2023-05-27 ENCOUNTER — Non-Acute Institutional Stay (SKILLED_NURSING_FACILITY): Payer: Medicare Other | Admitting: Orthopedic Surgery

## 2023-05-27 DIAGNOSIS — J029 Acute pharyngitis, unspecified: Secondary | ICD-10-CM

## 2023-05-27 DIAGNOSIS — B9789 Other viral agents as the cause of diseases classified elsewhere: Secondary | ICD-10-CM

## 2023-05-27 DIAGNOSIS — J019 Acute sinusitis, unspecified: Secondary | ICD-10-CM

## 2023-05-27 MED ORDER — MENTHOL 3 MG MT LOZG: 1.0000 | LOZENGE | OROMUCOSAL | Status: DC | PRN

## 2023-05-27 MED ORDER — FLUTICASONE PROPIONATE 50 MCG/ACT NA SUSP: 2.0000 | Freq: Every day | NASAL | Status: DC

## 2023-05-27 MED ORDER — CETIRIZINE HCL 10 MG PO TABS: 10.0000 mg | ORAL_TABLET | Freq: Every day | ORAL | Status: DC

## 2023-05-27 NOTE — Progress Notes (Signed)
 Location:  [facility] Room Number: 116/A Place of Service:  SNF [phone]) Provider:  Octavia Heir, NP   [name], MD  Patient Care Team: [name], MD as PCP - General (Internal Medicine) [name], MD as PCP - Cardiology (Cardiology) Duke Salvia, MD as PCP - Electrophysiology (Cardiology) Shirlean Kelly, MD as Consulting Physician (Neurosurgery)  Extended Emergency Contact Information Primary Emergency Contact: Sebald,[name] Address: [address]          [place] [phone] [name] Home Phone: [phone] Mobile Phone: [phone] Relation: Spouse Secondary Emergency Contact: Slay,[name] Address: [address]          [place], [place] [address] [name] Mobile Phone: [phone] Relation: Son  Code Status: Full code Goals of care: Advanced Directive information    03/31/2023    5:02 PM  Advanced Directives  Does Patient Have a Medical Advance Directive? Yes  Would patient like information on creating a medical advance directive? No - Patient declined     Chief Complaint  Patient presents with   Acute Visit    Nasal congestion, sore throat    HPI:  Pt is a 78 y.o. female seen today for acute visit due to sore throat and nasal congestion.   She currently resides on the skilled nursing unit at [facility]. 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   Symptoms of sore throat, nasal congestion and non productive cough began last night. Covid/flu tests negative this morning. Denies fever, chills, body aches, shortness of breath. UTD on flu and covid vaccinations. Afebrile. Vitals stable.   Past Medical History:  Diagnosis Date   Anxiety    Arthritis    "some in my lower back; probably elbows, knees" (11/18/2017)   Atrial fibrillation (HCC)     Bell's palsy    when pt. was 78 yrs old, when under stress the left side of face will droop.   Complication of anesthesia    "vascular OR 2016; BP bottomed out; couldn't get it regulated; ended up in ICU for DAYS" (11/18/2017)   GERD (gastroesophageal reflux disease)    History of kidney stones    Hypertension    Hypertrophic cardiomyopathy (HCC)    severe LV basilar hypertrophy witn no evidence of significant outflow tract obstruction, EF 65-70%, mild LAE, mild TR, grade 1a diastolic dysfunction 05/15/10 (Dr. Donato Schultz) (Atrial Septal Hypertrophy pattern)-- Intra-op TEE with dsignificant outflow tract obstruction - AI, MR & TR   Insomnia    Mild aortic sclerosis    Osteopenia    Peripheral vascular disease (HCC)    Syncope    , Vagal   Past Surgical History:  Procedure Laterality Date   AUGMENTATION MAMMAPLASTY Bilateral    BACK SURGERY     CARDIAC CATHETERIZATION N/A 05/07/2016   Procedure: Left Heart Cath and Coronary Angiography;  Surgeon: Kathleene Hazel, MD;  Location: [facility] INVASIVE CV LAB;  Service: Cardiovascular;  Laterality: N/A;   CARDIOVERSION N/A 09/24/2017   Procedure: CARDIOVERSION;  Surgeon: Laurey Morale, MD;  Location: [facility] ENDOSCOPY;  Service: Cardiovascular;  Laterality: N/A;   DILATION AND CURETTAGE OF UTERUS     ENDARTERECTOMY FEMORAL Right 03/02/2015   Procedure: ENDARTERECTOMY RIGHT FEMORAL;  Surgeon: Chuck Hint, MD;  Location[facility] OR;  Service: Vascular;  Laterality: Right;   ESOPHAGOGASTRODUODENOSCOPY (EGD) WITH  PROPOFOL N/A 12/01/2020   Procedure: ESOPHAGOGASTRODUODENOSCOPY (EGD) WITH PROPOFOL;  Surgeon: Diamantina Monks, MD;  Location: [facility] ENDOSCOPY;  Service: General;  Laterality: N/A;   FACIAL COSMETIC SURGERY Left 2002   "related to Bell's Palsy @ age 68; left eye/side of face droopy; tried to make area symmetrical"   FEMORAL-POPLITEAL BYPASS GRAFT Right 03/02/2015   Procedure: BYPASS GRAFT FEMORAL-BELOW KNEE POPLITEAL ARTERY;  Surgeon:  Chuck Hint, MD;  Location: [facility] OR;  Service: Vascular;  Laterality: Right;   INGUINAL HERNIA REPAIR Bilateral 2002   IR ANGIO INTRA EXTRACRAN SEL COM CAROTID INNOMINATE UNI L MOD SED  11/16/2020   IR CT HEAD LTD  11/13/2020   IR PERCUTANEOUS ART THROMBECTOMY/INFUSION INTRACRANIAL INC DIAG ANGIO  11/13/2020   OVARIAN CYST REMOVAL Left    PEG PLACEMENT N/A 12/01/2020   Procedure: PERCUTANEOUS ENDOSCOPIC GASTROSTOMY (PEG) PLACEMENT;  Surgeon: Diamantina Monks, MD;  Location: [facility] ENDOSCOPY;  Service: General;  Laterality: N/A;   PERIPHERAL VASCULAR CATHETERIZATION N/A 01/16/2015   Procedure: Abdominal Aortogram;  Surgeon: Chuck Hint, MD;  Location: [facility] INVASIVE CV LAB;  Service: Cardiovascular;  Laterality: N/A;   POSTERIOR LUMBAR FUSION  2015   "have plates and screws in there"   RADIOLOGY WITH ANESTHESIA N/A 11/13/2020   Procedure: IR WITH ANESTHESIA;  Surgeon: Radiologist, Medication, MD;  Location: [facility] OR;  Service: Radiology;  Laterality: N/A;   TONSILLECTOMY      Allergies  Allergen Reactions   Amoxicillin Other (See Comments)    UTI Has patient had a PCN reaction causing immediate rash, facial/tongue/throat swelling, SOB or lightheadedness with hypotension: No Has patient had a PCN reaction causing severe rash involving mucus membranes or skin necrosis: No Has patient had a PCN reaction that required hospitalization: No Has patient had a PCN reaction occurring within the last 10 years: Yes--UTI ONLY If all of the above answers are "NO", then may proceed with Cephalosporin use.    Atenolol Cough   Crestor [Rosuvastatin Calcium] Other (See Comments)    Muscle aches   Pravastatin Other (See Comments)    Muscle aches   Rosuvastatin Calcium    Sulfa Antibiotics Nausea Only   Codeine Other (See Comments)    hallucinations    Outpatient Encounter Medications as of 05/27/2023  Medication Sig   acetaminophen (TYLENOL) 325 MG tablet Take 650 mg by mouth every 6 (six) hours as  needed for mild pain or fever.   albuterol (VENTOLIN HFA) 108 (90 Base) MCG/ACT inhaler Inhale 2 puffs into the lungs every 6 (six) hours as needed for wheezing or shortness of breath.   amiodarone (PACERONE) 200 MG tablet Take 100 mg by mouth daily. May give crushed   amLODipine (NORVASC) 5 MG tablet Take 2.5 mg by mouth daily.   apixaban (ELIQUIS) 5 MG TABS tablet Take 5 mg by mouth 2 (two) times daily. May give crushed   bisacodyl (DULCOLAX) 5 MG EC tablet Take 5 mg by mouth 2 (two) times daily as needed for moderate constipation.   buPROPion (WELLBUTRIN XL) 150 MG 24 hr tablet Take 150 mg by mouth daily.   diclofenac Sodium (VOLTAREN) 1 % GEL Apply one application to right hip and knee four times daily as needed.   Emollient (CERAVE) CREA Apply 1 Application topically as needed.   ezetimibe (ZETIA) 10 MG tablet Take 10 mg by mouth daily. Oral if crushed   furosemide (LASIX) 20 MG tablet Take 20 mg by mouth daily as needed. Give Lasix 20mg  for weight  gain equal or greater than 3 pounds   hydrALAZINE (APRESOLINE) 10 MG tablet Take 10 mg by mouth 3 (three) times daily as needed (SBP > 165 or DBP > 95).   hydrocortisone cream 1 % Apply 1 Application topically 2 (two) times daily as needed for itching.   ipratropium-albuterol (DUONEB) 0.5-2.5 (3) MG/3ML SOLN Inhale 3 mLs into the lungs every 4 (four) hours as needed.   ketoconazole (NIZORAL) 2 % cream Apply 1 application  topically daily as needed for irritation.   nystatin cream (MYCOSTATIN) Apply 1 application topically 2 (two) times daily as needed for dry skin (apply to perirectal rash).   ondansetron (ZOFRAN) 4 MG tablet Take 4 mg by mouth every 6 (six) hours as needed for nausea or vomiting.   OXYGEN Inhale 3 L into the lungs as directed. to maintain sats > or equal to 90%. May titrate Three Times A Day; 07:00 AM - 03:00 PM, 03:00 PM - 11:00 PM, 11:00 PM - 7:00 am   pantoprazole (PROTONIX) 40 MG tablet Take 40 mg by mouth 2 (two) times daily.    Polyethyl Glycol-Propyl Glycol (SYSTANE) 0.4-0.3 % SOLN Apply 2 drops to eye every 6 (six) hours as needed (dry eyes).   polyethylene glycol powder (GLYCOLAX/MIRALAX) 17 GM/SCOOP powder Take 17 g by mouth daily.   sennosides-docusate sodium (SENOKOT-S) 8.6-50 MG tablet Take 1 tablet by mouth daily.   Tiotropium Bromide-Olodaterol (STIOLTO RESPIMAT) 2.5-2.5 MCG/ACT AERS Inhale 2 puffs into the lungs daily.   Facility-Administered Encounter Medications as of 05/27/2023  Medication   botulinum toxin Type A (BOTOX) injection 100 Units    Review of Systems  Constitutional:  Negative for fatigue and fever.  HENT:  Positive for congestion, rhinorrhea and sore throat. Negative for ear pain and sinus pain.   Respiratory:  Positive for cough and shortness of breath. Negative for wheezing.   Gastrointestinal:  Negative for nausea and vomiting.  Musculoskeletal:  Negative for myalgias.  Neurological:  Negative for headaches.  Psychiatric/Behavioral:  Negative for dysphoric mood. The patient is not nervous/anxious.     Immunization History  Administered Date(s) Administered   Fluad Quad(high Dose 65+) 02/05/2022, 01/08/2023   Influenza Split 01/16/2009, 04/30/2010, 01/26/2016, 02/07/2018, 02/01/2020   Influenza, High Dose Seasonal PF 02/07/2018, 01/25/2019, 02/28/2021, 02/05/2022   Influenza,inj,Quad PF,6+ Mos 03/06/2012, 01/26/2016, 01/10/2017   Influenza,inj,quad, With Preservative 02/14/2015   Moderna Covid-19 Vaccine Bivalent Booster 51yrs & up 02/28/2021, 02/04/2023   PFIZER(Purple Top)SARS-COV-2 Vaccination 05/06/2019, 05/27/2019, 01/27/2020, 09/18/2020   Pneumococcal Conjugate-13 03/19/2017   Pneumococcal Polysaccharide-23 02/14/2015   Respiratory Syncytial Virus Vaccine,Recomb Aduvanted(Arexvy) 02/15/2022   Td 02/25/2005   Tdap 05/19/2013   Zoster, Live 07/28/2006   Pertinent  Health Maintenance Due  Topic Date Due   INFLUENZA VACCINE  Completed   DEXA SCAN  Discontinued       05/07/2022   11:59 AM 08/13/2022   11:02 AM 11/26/2022   12:34 PM 12/31/2022    3:26 PM 03/11/2023    2:48 PM  Fall Risk  Falls in the past year? 0 1 0 0 1  Was there an injury with Fall? 0 0 0 0 0  Fall Risk Category Calculator 0 1 0 0 1  Patient at Risk for Falls Due to No Fall Risks History of fall(s);Impaired balance/gait;Impaired mobility History of fall(s);Impaired balance/gait History of fall(s);Impaired balance/gait;Impaired mobility History of fall(s);Impaired balance/gait;Impaired mobility  Patient at Risk for Falls Due to - Comments  right sided hemiparesis     Fall risk Follow  up Falls evaluation completed Falls evaluation completed;Education provided;Falls prevention discussed Falls evaluation completed;Education provided Falls evaluation completed;Education provided;Falls prevention discussed Falls evaluation completed;Education provided;Falls prevention discussed   Functional Status Survey:    Vitals:   05/27/23 1410  BP: 116/66  Pulse: (!) 49  Resp: 17  Temp: (!) 97.1 F (36.2 C)  SpO2: 95%  Weight: 95 lb 6.4 oz (43.3 kg)  Height: 5\' 3"  (1.6 m)   Body mass index is 16.9 kg/m. Physical Exam Vitals reviewed.  Constitutional:      General: She is not in acute distress. HENT:     Head: Normocephalic.     Nose:     Right Turbinates: Enlarged.     Left Turbinates: Enlarged.     Right Sinus: No maxillary sinus tenderness or frontal sinus tenderness.     Left Sinus: No maxillary sinus tenderness or frontal sinus tenderness.     Mouth/Throat:     Mouth: Mucous membranes are moist.     Pharynx: No oropharyngeal exudate or posterior oropharyngeal erythema.  Eyes:     General:        Right eye: No discharge.        Left eye: No discharge.  Cardiovascular:     Rate and Rhythm: Regular rhythm. Bradycardia present.     Pulses: Normal pulses.     Heart sounds: Normal heart sounds.  Pulmonary:     Effort: No respiratory distress.     Breath sounds: Normal breath  sounds. No wheezing or rales.     Comments: 2 liters nasal cannula Abdominal:     Palpations: Abdomen is soft.  Musculoskeletal:        General: Normal range of motion.     Cervical back: Neck supple.  Lymphadenopathy:     Cervical: No cervical adenopathy.  Skin:    General: Skin is warm.     Capillary Refill: Capillary refill takes less than 2 seconds.  Neurological:     General: No focal deficit present.     Mental Status: She is alert and oriented to person, place, and time.     Motor: Weakness present.     Gait: Gait abnormal.     Comments: Left sided weakness  Psychiatric:        Mood and Affect: Mood normal.     Labs reviewed: Recent Labs    03/18/23 0000  NA 140  K 4.4  CL 106  CO2 22  BUN 18  CREATININE 0.8  CALCIUM 10.0   Recent Labs    03/18/23 0000  AST 27  ALT 23  ALKPHOS 96  ALBUMIN 4.2   Recent Labs    09/19/22 0000 03/18/23 0000  WBC 7.4 4.0  HGB 14.0 12.5  HCT 43 38  PLT 246 180   Lab Results  Component Value Date   TSH 0.61 08/08/2022   Lab Results  Component Value Date   HGBA1C 6.0 (H) 11/13/2020   Lab Results  Component Value Date   CHOL 164 03/18/2023   HDL 61 03/18/2023   LDLCALC 67 03/18/2023   TRIG 179 (A) 03/18/2023   CHOLHDL 3.0 11/13/2020    Significant Diagnostic Results in last 30 days:  No results found.  Assessment/Plan 1. Acute viral sinusitis (Primary) - onset x 1 day - covid/flu negative - enlarged nasal turbinates  - start Zyrtec and  - cetirizine (ZYRTEC) 10 MG tablet; Take 1 tablet (10 mg total) by mouth daily for 14 days. - fluticasone (FLONASE) 50  MCG/ACT nasal spray; Place 2 sprays into both nostrils daily for 14 days.  2. Sore throat - onset x 1 day - covid/flu negative - exam unremarkable - menthol-cetylpyridinium (CEPACOL) 3 MG lozenge; Take 1 lozenge (3 mg total) by mouth as needed for sore throat.    Family/ staff Communication: plan discussed with patient and nurse  Labs/tests  ordered:  if no improvement or symptoms worsen> repeat covid/flu tests

## 2023-05-30 ENCOUNTER — Non-Acute Institutional Stay (SKILLED_NURSING_FACILITY): Payer: Medicare Other | Admitting: Adult Health

## 2023-05-30 DIAGNOSIS — J40 Bronchitis, not specified as acute or chronic: Secondary | ICD-10-CM | POA: Diagnosis not present

## 2023-05-30 DIAGNOSIS — J9611 Chronic respiratory failure with hypoxia: Secondary | ICD-10-CM | POA: Diagnosis not present

## 2023-05-30 DIAGNOSIS — J439 Emphysema, unspecified: Secondary | ICD-10-CM

## 2023-05-30 DIAGNOSIS — H1031 Unspecified acute conjunctivitis, right eye: Secondary | ICD-10-CM | POA: Diagnosis not present

## 2023-05-30 DIAGNOSIS — K5901 Slow transit constipation: Secondary | ICD-10-CM

## 2023-05-30 NOTE — Progress Notes (Signed)
 Location:  Medical illustrator of Service:  SNF (31) Provider:   Peggye Ley, ANP [facility] [phone]   [name], MD  Patient Care Team: [name], MD as PCP - General (Internal Medicine) [name], MD as PCP - Cardiology (Cardiology) Duke Salvia, MD as PCP - Electrophysiology (Cardiology) Shirlean Kelly, MD as Consulting Physician (Neurosurgery)  Extended Emergency Contact Information Primary Emergency Contact: Southgate,[name] Address: [address]          [place] [phone] [name] Home Phone: [phone] Mobile Phone: [phone] Relation: Spouse Secondary Emergency Contact: Swails,[name] Address: [address]          [place], [place] [address] [name] Mobile Phone: [phone] Relation: Son  Code Status:  Full Goals of care: Advanced Directive information    03/31/2023    5:02 PM  Advanced Directives  Does Patient Have a Medical Advance Directive? Yes  Would patient like information on creating a medical advance directive? No - Patient declined     Chief Complaint  Patient presents with   Acute Visit    cough    HPI:  Pt is a 78 y.o. female seen today for an acute visit for cough  Beverly Li has had a cough, congestion and sputum production which is worsening. Symptoms of sorethroat and nasal congested started 5 days ago. Negative for flu and covid x 2. She has purulent sputum and purulent drainage from the right eye. She feels she is worsening, is weak, and not getting up for her usual exercise. 02 sats are stable. Denies sob different from baseline. No fever. Nurse reports very abnormal breath sounds and concerns for pna.  PMH signficant for COPD, oxygen dependency, CVA, HTN, afib, multifocal pna, former smoker.   Has tried flonase, cepacol, duonebs, zyrtec. Minimal benefit  Past Medical History:  Diagnosis Date   Anxiety    Arthritis     "some in my lower back; probably elbows, knees" (11/18/2017)   Atrial fibrillation (HCC)    Bell's palsy    when pt. was 78 yrs old, when under stress the left side of face will droop.   Complication of anesthesia    "vascular OR 2016; BP bottomed out; couldn't get it regulated; ended up in ICU for DAYS" (11/18/2017)   GERD (gastroesophageal reflux disease)    History of kidney stones    Hypertension    Hypertrophic cardiomyopathy (HCC)    severe LV basilar hypertrophy witn no evidence of significant outflow tract obstruction, EF 65-70%, mild LAE, mild TR, grade 1a diastolic dysfunction 05/15/10 (Dr. Donato Schultz) (Atrial Septal Hypertrophy pattern)-- Intra-op TEE with dsignificant outflow tract obstruction - AI, MR & TR   Insomnia    Mild aortic sclerosis    Osteopenia    Peripheral vascular disease (HCC)    Syncope    , Vagal   Past Surgical History:  Procedure Laterality Date   AUGMENTATION MAMMAPLASTY Bilateral    BACK SURGERY     CARDIAC CATHETERIZATION N/A 05/07/2016   Procedure: Left Heart Cath and Coronary Angiography;  Surgeon: Kathleene Hazel, MD;  Location: [facility] INVASIVE CV LAB;  Service: Cardiovascular;  Laterality: N/A;   CARDIOVERSION N/A 09/24/2017   Procedure: CARDIOVERSION;  Surgeon: Laurey Morale, MD;  Location: [facility] ENDOSCOPY;  Service: Cardiovascular;  Laterality: N/A;   DILATION AND CURETTAGE OF UTERUS     ENDARTERECTOMY FEMORAL Right 03/02/2015   Procedure: ENDARTERECTOMY  RIGHT FEMORAL;  Surgeon: Chuck Hint, MD;  Location: [facility] OR;  Service: Vascular;  Laterality: Right;   ESOPHAGOGASTRODUODENOSCOPY (EGD) WITH PROPOFOL N/A 12/01/2020   Procedure: ESOPHAGOGASTRODUODENOSCOPY (EGD) WITH PROPOFOL;  Surgeon: Diamantina Monks, MD;  Location: [facility] ENDOSCOPY;  Service: General;  Laterality: N/A;   FACIAL COSMETIC SURGERY Left 2002   "related to Bell's Palsy @ age 81; left eye/side of face droopy; tried to make area symmetrical"   FEMORAL-POPLITEAL BYPASS GRAFT Right  03/02/2015   Procedure: BYPASS GRAFT FEMORAL-BELOW KNEE POPLITEAL ARTERY;  Surgeon: Chuck Hint, MD;  Location: [facility] OR;  Service: Vascular;  Laterality: Right;   INGUINAL HERNIA REPAIR Bilateral 2002   IR ANGIO INTRA EXTRACRAN SEL COM CAROTID INNOMINATE UNI L MOD SED  11/16/2020   IR CT HEAD LTD  11/13/2020   IR PERCUTANEOUS ART THROMBECTOMY/INFUSION INTRACRANIAL INC DIAG ANGIO  11/13/2020   OVARIAN CYST REMOVAL Left    PEG PLACEMENT N/A 12/01/2020   Procedure: PERCUTANEOUS ENDOSCOPIC GASTROSTOMY (PEG) PLACEMENT;  Surgeon: Diamantina Monks, MD;  Location: [facility] ENDOSCOPY;  Service: General;  Laterality: N/A;   PERIPHERAL VASCULAR CATHETERIZATION N/A 01/16/2015   Procedure: Abdominal Aortogram;  Surgeon: Chuck Hint, MD;  Location: [facility] INVASIVE CV LAB;  Service: Cardiovascular;  Laterality: N/A;   POSTERIOR LUMBAR FUSION  2015   "have plates and screws in there"   RADIOLOGY WITH ANESTHESIA N/A 11/13/2020   Procedure: IR WITH ANESTHESIA;  Surgeon: Radiologist, Medication, MD;  Location: [facility] OR;  Service: Radiology;  Laterality: N/A;   TONSILLECTOMY      Allergies  Allergen Reactions   Amoxicillin Other (See Comments)    UTI Has patient had a PCN reaction causing immediate rash, facial/tongue/throat swelling, SOB or lightheadedness with hypotension: No Has patient had a PCN reaction causing severe rash involving mucus membranes or skin necrosis: No Has patient had a PCN reaction that required hospitalization: No Has patient had a PCN reaction occurring within the last 10 years: Yes--UTI ONLY If all of the above answers are "NO", then may proceed with Cephalosporin use.    Atenolol Cough   Crestor [Rosuvastatin Calcium] Other (See Comments)    Muscle aches   Pravastatin Other (See Comments)    Muscle aches   Rosuvastatin Calcium    Sulfa Antibiotics Nausea Only   Codeine Other (See Comments)    hallucinations    Outpatient Encounter Medications as of 05/30/2023  Medication Sig    doxycycline (VIBRA-TABS) 100 MG tablet Take 1 tablet (100 mg total) by mouth 2 (two) times daily for 7 days.   predniSONE (DELTASONE) 20 MG tablet Take 1 tablet (20 mg total) by mouth daily with breakfast for 5 days.   acetaminophen (TYLENOL) 325 MG tablet Take 650 mg by mouth every 6 (six) hours as needed for mild pain or fever.   albuterol (VENTOLIN HFA) 108 (90 Base) MCG/ACT inhaler Inhale 2 puffs into the lungs every 6 (six) hours as needed for wheezing or shortness of breath.   amiodarone (PACERONE) 200 MG tablet Take 100 mg by mouth daily. May give crushed   amLODipine (NORVASC) 5 MG tablet Take 2.5 mg by mouth daily.   apixaban (ELIQUIS) 5 MG TABS tablet Take 5 mg by mouth 2 (two) times daily. May give crushed   bisacodyl (DULCOLAX) 5 MG EC tablet Take 5 mg by mouth 2 (two) times daily as needed for moderate constipation.   buPROPion (WELLBUTRIN XL) 150 MG 24 hr tablet Take 150 mg by mouth daily.  cetirizine (ZYRTEC) 10 MG tablet Take 1 tablet (10 mg total) by mouth daily for 14 days.   diclofenac Sodium (VOLTAREN) 1 % GEL Apply one application to right hip and knee four times daily as needed.   Emollient (CERAVE) CREA Apply 1 Application topically as needed.   ezetimibe (ZETIA) 10 MG tablet Take 10 mg by mouth daily. Oral if crushed   fluticasone (FLONASE) 50 MCG/ACT nasal spray Place 2 sprays into both nostrils daily for 14 days.   furosemide (LASIX) 20 MG tablet Take 20 mg by mouth daily as needed. Give Lasix 20mg  for weight gain equal or greater than 3 pounds   hydrALAZINE (APRESOLINE) 10 MG tablet Take 10 mg by mouth 3 (three) times daily as needed (SBP > 165 or DBP > 95).   hydrocortisone cream 1 % Apply 1 Application topically 2 (two) times daily as needed for itching.   ipratropium-albuterol (DUONEB) 0.5-2.5 (3) MG/3ML SOLN Inhale 3 mLs into the lungs every 4 (four) hours as needed.   ketoconazole (NIZORAL) 2 % cream Apply 1 application  topically daily as needed for irritation.    menthol-cetylpyridinium (CEPACOL) 3 MG lozenge Take 1 lozenge (3 mg total) by mouth every 4 (four) hours as needed for sore throat.   nystatin cream (MYCOSTATIN) Apply 1 application topically 2 (two) times daily as needed for dry skin (apply to perirectal rash).   ondansetron (ZOFRAN) 4 MG tablet Take 4 mg by mouth every 6 (six) hours as needed for nausea or vomiting.   OXYGEN Inhale 3 L into the lungs as directed. to maintain sats > or equal to 90%. May titrate Three Times A Day; 07:00 AM - 03:00 PM, 03:00 PM - 11:00 PM, 11:00 PM - 7:00 am   pantoprazole (PROTONIX) 40 MG tablet Take 40 mg by mouth 2 (two) times daily.   Polyethyl Glycol-Propyl Glycol (SYSTANE) 0.4-0.3 % SOLN Apply 2 drops to eye every 6 (six) hours as needed (dry eyes).   polyethylene glycol powder (GLYCOLAX/MIRALAX) 17 GM/SCOOP powder Take 17 g by mouth every other day.   sennosides-docusate sodium (SENOKOT-S) 8.6-50 MG tablet Take 1 tablet by mouth daily.   Tiotropium Bromide-Olodaterol (STIOLTO RESPIMAT) 2.5-2.5 MCG/ACT AERS Inhale 2 puffs into the lungs daily.   [DISCONTINUED] polyethylene glycol powder (GLYCOLAX/MIRALAX) 17 GM/SCOOP powder Take 17 g by mouth daily.   Facility-Administered Encounter Medications as of 05/30/2023  Medication   botulinum toxin Type A (BOTOX) injection 100 Units    Review of Systems  Constitutional:  Positive for activity change and fatigue. Negative for appetite change, chills, diaphoresis, fever and unexpected weight change.  HENT:  Positive for congestion, rhinorrhea and sore throat. Negative for ear discharge, facial swelling, sinus pressure, sinus pain, trouble swallowing and voice change.   Respiratory:  Positive for cough and wheezing. Negative for shortness of breath.   Cardiovascular:  Negative for chest pain, palpitations and leg swelling.  Gastrointestinal:  Negative for abdominal distention, abdominal pain, constipation and diarrhea.  Genitourinary:  Negative for difficulty  urinating and dysuria.  Musculoskeletal:  Positive for gait problem. Negative for arthralgias, back pain, joint swelling and myalgias.  Neurological:  Positive for weakness. Negative for dizziness, tremors, seizures, syncope, facial asymmetry, speech difficulty, light-headedness, numbness and headaches.  Psychiatric/Behavioral:  Negative for agitation, behavioral problems and confusion.     Immunization History  Administered Date(s) Administered   Fluad Quad(high Dose 65+) 02/05/2022, 01/08/2023   Influenza Split 01/16/2009, 04/30/2010, 01/26/2016, 02/07/2018, 02/01/2020   Influenza, High Dose Seasonal PF 02/07/2018,  01/25/2019, 02/28/2021, 02/05/2022   Influenza,inj,Quad PF,6+ Mos 03/06/2012, 01/26/2016, 01/10/2017   Influenza,inj,quad, With Preservative 02/14/2015   Moderna Covid-19 Vaccine Bivalent Booster 57yrs & up 02/28/2021, 02/04/2023   PFIZER(Purple Top)SARS-COV-2 Vaccination 05/06/2019, 05/27/2019, 01/27/2020, 09/18/2020   Pneumococcal Conjugate-13 03/19/2017   Pneumococcal Polysaccharide-23 02/14/2015   Respiratory Syncytial Virus Vaccine,Recomb Aduvanted(Arexvy) 02/15/2022   Td 02/25/2005   Tdap 05/19/2013   Zoster, Live 07/28/2006   Pertinent  Health Maintenance Due  Topic Date Due   INFLUENZA VACCINE  Completed   DEXA SCAN  Discontinued      05/07/2022   11:59 AM 08/13/2022   11:02 AM 11/26/2022   12:34 PM 12/31/2022    3:26 PM 03/11/2023    2:48 PM  Fall Risk  Falls in the past year? 0 1 0 0 1  Was there an injury with Fall? 0 0 0 0 0  Fall Risk Category Calculator 0 1 0 0 1  Patient at Risk for Falls Due to No Fall Risks History of fall(s);Impaired balance/gait;Impaired mobility History of fall(s);Impaired balance/gait History of fall(s);Impaired balance/gait;Impaired mobility History of fall(s);Impaired balance/gait;Impaired mobility  Patient at Risk for Falls Due to - Comments  right sided hemiparesis     Fall risk Follow up Falls evaluation completed Falls  evaluation completed;Education provided;Falls prevention discussed Falls evaluation completed;Education provided Falls evaluation completed;Education provided;Falls prevention discussed Falls evaluation completed;Education provided;Falls prevention discussed   Functional Status Survey:    Vitals:   05/31/23 1010  Resp: 20  Temp: (!) 97 F (36.1 C)  SpO2: 94%   There is no height or weight on file to calculate BMI. Physical Exam Vitals and nursing note reviewed.  Constitutional:      General: She is not in acute distress.    Appearance: She is not diaphoretic.  HENT:     Head: Normocephalic and atraumatic.     Nose: Congestion present.     Mouth/Throat:     Pharynx: Oropharyngeal exudate present.  Eyes:     General:        Right eye: Discharge present.        Left eye: No discharge.     Extraocular Movements:     Right eye: Normal extraocular motion.     Left eye: Normal extraocular motion.     Conjunctiva/sclera:     Right eye: Right conjunctiva is injected. Chemosis and exudate present.     Left eye: Left conjunctiva is not injected. No chemosis.    Pupils: Pupils are equal, round, and reactive to light.  Neck:     Vascular: No JVD.  Cardiovascular:     Rate and Rhythm: Normal rate and regular rhythm.     Heart sounds: No murmur heard. Pulmonary:     Effort: Pulmonary effort is normal. No respiratory distress.     Breath sounds: Wheezing and rhonchi present.  Skin:    General: Skin is warm and dry.  Neurological:     Mental Status: She is alert and oriented to person, place, and time.     Labs reviewed: Recent Labs    03/18/23 0000  NA 140  K 4.4  CL 106  CO2 22  BUN 18  CREATININE 0.8  CALCIUM 10.0   Recent Labs    03/18/23 0000  AST 27  ALT 23  ALKPHOS 96  ALBUMIN 4.2   Recent Labs    09/19/22 0000 03/18/23 0000  WBC 7.4 4.0  HGB 14.0 12.5  HCT 43 38  PLT 246 180  Lab Results  Component Value Date   TSH 0.61 08/08/2022   Lab  Results  Component Value Date   HGBA1C 6.0 (H) 11/13/2020   Lab Results  Component Value Date   CHOL 164 03/18/2023   HDL 61 03/18/2023   LDLCALC 67 03/18/2023   TRIG 179 (A) 03/18/2023   CHOLHDL 3.0 11/13/2020    Significant Diagnostic Results in last 30 days:  No results found.  Assessment/Plan 1. Bronchitis (Primary) Abnormal lungs sounds, increased sputum production, not improving with purulent sputum and eye drainage. High risk patient for exacerbation/hospitalization  Check CXR - doxycycline (VIBRA-TABS) 100 MG tablet; Take 1 tablet (100 mg total) by mouth 2 (two) times daily for 7 days. - predniSONE (DELTASONE) 20 MG tablet; Take 1 tablet (20 mg total) by mouth daily with breakfast for 5 days.  2. Acute bacterial conjunctivitis of right eye See above If not improving notify provider for topical therapy  3. Pulmonary emphysema, unspecified emphysema type (HCC) On stiolto Prn duonebs Stable oxygen requirements   4. Chronic respiratory failure with hypoxia (HCC) Continue oxygen therapy to maintain stats 90 or greater     Family/ staff Communication: resident and her husband at bedside  Labs/tests ordered:  CXR

## 2023-05-31 ENCOUNTER — Encounter: Payer: Self-pay | Admitting: Adult Health

## 2023-05-31 MED ORDER — POLYETHYLENE GLYCOL 3350 17 GM/SCOOP PO POWD: 17.0000 g | ORAL | Status: AC

## 2023-05-31 MED ORDER — PREDNISONE 20 MG PO TABS: 20.0000 mg | ORAL_TABLET | Freq: Every day | ORAL | Status: DC

## 2023-05-31 MED ORDER — DOXYCYCLINE HYCLATE 100 MG PO TABS: 100.0000 mg | ORAL_TABLET | Freq: Two times a day (BID) | ORAL | Status: AC

## 2023-06-03 ENCOUNTER — Encounter: Payer: Self-pay | Admitting: Orthopedic Surgery

## 2023-06-03 ENCOUNTER — Non-Acute Institutional Stay (SKILLED_NURSING_FACILITY): Payer: Medicare Other | Admitting: Orthopedic Surgery

## 2023-06-03 DIAGNOSIS — I5032 Chronic diastolic (congestive) heart failure: Secondary | ICD-10-CM

## 2023-06-03 MED ORDER — FUROSEMIDE 20 MG PO TABS: 20.0000 mg | ORAL_TABLET | Freq: Every day | ORAL | Status: DC

## 2023-06-03 MED ORDER — POTASSIUM CHLORIDE CRYS ER 20 MEQ PO TBCR: 10.0000 meq | EXTENDED_RELEASE_TABLET | Freq: Every day | ORAL | Status: DC

## 2023-06-03 NOTE — Progress Notes (Signed)
 Location:  [facility] Room Number: 116/A Place of Service:  SNF [phone]) Provider:  Octavia Heir, NP   [name], MD  Patient Care Team: [name], MD as PCP - General (Internal Medicine) [name], MD as PCP - Cardiology (Cardiology) Duke Salvia, MD as PCP - Electrophysiology (Cardiology) Shirlean Kelly, MD as Consulting Physician (Neurosurgery)  Extended Emergency Contact Information Primary Emergency Contact: Daoud,[name] Address: [address]          [place] [phone] [name] Home Phone: [phone] Mobile Phone: [phone] Relation: Spouse Secondary Emergency Contact: Dunivan,[name] Address: [address]          [place], [place] [address] [name] Mobile Phone: [phone] Relation: Son  Code Status:  Full code Goals of care: Advanced Directive information    03/31/2023    5:02 PM  Advanced Directives  Does Patient Have a Medical Advance Directive? Yes  Would patient like information on creating a medical advance directive? No - Patient declined     Chief Complaint  Patient presents with   Acute Visit    HPI:  Pt is a 78 y.o. female seen today for acute visit due to acute cough.   She currently resides on the skilled nursing unit at [facility]. 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   Worsening non productive cough since 02/11. Covid/flu negative. Initially started on zyrtec, flonase and cepacol for nasal congestion and sore throat. 02/14 she was started on doxycycline and prednisone due to concerns for PNA and bronchitis. 02/17 CXR negative for infiltrates or effusion, mild CHF noted. Her sats > 90% on 2 liters. She remains on stiolto and duonebs prn for pulmonary emphysema/ chronic respiratory failure. H/o CHF. Today's weight 86.8 lbs  > was 95.4 lbs. No ankle edema. She denies chest pain or shortness of breath. Afebrile. Vitals stable.    Past Medical History:  Diagnosis Date   Anxiety    Arthritis    "some in my lower back; probably elbows, knees" (11/18/2017)   Atrial fibrillation (HCC)    Bell's palsy    when pt. was 78 yrs old, when under stress the left side of face will droop.   Complication of anesthesia    "vascular OR 2016; BP bottomed out; couldn't get it regulated; ended up in ICU for DAYS" (11/18/2017)   GERD (gastroesophageal reflux disease)    History of kidney stones    Hypertension    Hypertrophic cardiomyopathy (HCC)    severe LV basilar hypertrophy witn no evidence of significant outflow tract obstruction, EF 65-70%, mild LAE, mild TR, grade 1a diastolic dysfunction 05/15/10 (Dr. Donato Schultz) (Atrial Septal Hypertrophy pattern)-- Intra-op TEE with dsignificant outflow tract obstruction - AI, MR & TR   Insomnia    Mild aortic sclerosis    Osteopenia    Peripheral vascular disease (HCC)    Syncope    , Vagal   Past Surgical History:  Procedure Laterality Date   AUGMENTATION MAMMAPLASTY Bilateral    BACK SURGERY     CARDIAC CATHETERIZATION N/A 05/07/2016   Procedure: Left Heart Cath and Coronary Angiography;  Surgeon: Kathleene Hazel, MD;  Location: [facility] INVASIVE CV LAB;  Service: Cardiovascular;  Laterality: N/A;   CARDIOVERSION N/A 09/24/2017   Procedure: CARDIOVERSION;  Surgeon: Laurey Morale, MD;  Location: [facility] ENDOSCOPY;  Service: Cardiovascular;  Laterality: N/A;   DILATION AND CURETTAGE OF UTERUS     ENDARTERECTOMY FEMORAL Right 03/02/2015   Procedure: ENDARTERECTOMY RIGHT FEMORAL;  Surgeon: Chuck Hint, MD;  Location: [facility] OR;  Service: Vascular;  Laterality: Right;   ESOPHAGOGASTRODUODENOSCOPY (EGD) WITH PROPOFOL N/A 12/01/2020   Procedure: ESOPHAGOGASTRODUODENOSCOPY (EGD) WITH PROPOFOL;  Surgeon: Diamantina Monks, MD;  Location: [facility] ENDOSCOPY;  Service: General;  Laterality: N/A;    FACIAL COSMETIC SURGERY Left 2002   "related to Bell's Palsy @ age 17; left eye/side of face droopy; tried to make area symmetrical"   FEMORAL-POPLITEAL BYPASS GRAFT Right 03/02/2015   Procedure: BYPASS GRAFT FEMORAL-BELOW KNEE POPLITEAL ARTERY;  Surgeon: Chuck Hint, MD;  Location: [facility] OR;  Service: Vascular;  Laterality: Right;   INGUINAL HERNIA REPAIR Bilateral 2002   IR ANGIO INTRA EXTRACRAN SEL COM CAROTID INNOMINATE UNI L MOD SED  11/16/2020   IR CT HEAD LTD  11/13/2020   IR PERCUTANEOUS ART THROMBECTOMY/INFUSION INTRACRANIAL INC DIAG ANGIO  11/13/2020   OVARIAN CYST REMOVAL Left    PEG PLACEMENT N/A 12/01/2020   Procedure: PERCUTANEOUS ENDOSCOPIC GASTROSTOMY (PEG) PLACEMENT;  Surgeon: Diamantina Monks, MD;  Location: [facility] ENDOSCOPY;  Service: General;  Laterality: N/A;   PERIPHERAL VASCULAR CATHETERIZATION N/A 01/16/2015   Procedure: Abdominal Aortogram;  Surgeon: Chuck Hint, MD;  Location: [facility] INVASIVE CV LAB;  Service: Cardiovascular;  Laterality: N/A;   POSTERIOR LUMBAR FUSION  2015   "have plates and screws in there"   RADIOLOGY WITH ANESTHESIA N/A 11/13/2020   Procedure: IR WITH ANESTHESIA;  Surgeon: Radiologist, Medication, MD;  Location: [facility] OR;  Service: Radiology;  Laterality: N/A;   TONSILLECTOMY      Allergies  Allergen Reactions   Amoxicillin Other (See Comments)    UTI Has patient had a PCN reaction causing immediate rash, facial/tongue/throat swelling, SOB or lightheadedness with hypotension: No Has patient had a PCN reaction causing severe rash involving mucus membranes or skin necrosis: No Has patient had a PCN reaction that required hospitalization: No Has patient had a PCN reaction occurring within the last 10 years: Yes--UTI ONLY If all of the above answers are "NO", then may proceed with Cephalosporin use.    Atenolol Cough   Crestor [Rosuvastatin Calcium] Other (See Comments)    Muscle aches   Pravastatin Other (See Comments)    Muscle aches    Rosuvastatin Calcium    Sulfa Antibiotics Nausea Only   Codeine Other (See Comments)    hallucinations    Outpatient Encounter Medications as of 06/03/2023  Medication Sig   acetaminophen (TYLENOL) 325 MG tablet Take 650 mg by mouth every 6 (six) hours as needed for mild pain or fever.   albuterol (VENTOLIN HFA) 108 (90 Base) MCG/ACT inhaler Inhale 2 puffs into the lungs every 6 (six) hours as needed for wheezing or shortness of breath.   amiodarone (PACERONE) 200 MG tablet Take 100 mg by mouth daily. May give crushed   amLODipine (NORVASC) 5 MG tablet Take 2.5 mg by mouth daily.   apixaban (ELIQUIS) 5 MG TABS tablet Take 5 mg by mouth 2 (two) times daily. May give crushed   bisacodyl (DULCOLAX) 5 MG EC tablet Take 5 mg by mouth 2 (two) times daily as needed for moderate constipation.   buPROPion (WELLBUTRIN XL) 150 MG 24 hr tablet Take 150 mg by mouth daily.   cetirizine (ZYRTEC) 10 MG tablet Take 1 tablet (10 mg total) by mouth daily for 14 days.  diclofenac Sodium (VOLTAREN) 1 % GEL Apply one application to right hip and knee four times daily as needed.   doxycycline (VIBRA-TABS) 100 MG tablet Take 1 tablet (100 mg total) by mouth 2 (two) times daily for 7 days.   Emollient (CERAVE) CREA Apply 1 Application topically as needed.   ezetimibe (ZETIA) 10 MG tablet Take 10 mg by mouth daily. Oral if crushed   fluticasone (FLONASE) 50 MCG/ACT nasal spray Place 2 sprays into both nostrils daily for 14 days.   furosemide (LASIX) 20 MG tablet Take 20 mg by mouth daily as needed. Give Lasix 20mg  for weight gain equal or greater than 3 pounds   hydrALAZINE (APRESOLINE) 10 MG tablet Take 10 mg by mouth 3 (three) times daily as needed (SBP > 165 or DBP > 95).   hydrocortisone cream 1 % Apply 1 Application topically 2 (two) times daily as needed for itching.   ipratropium-albuterol (DUONEB) 0.5-2.5 (3) MG/3ML SOLN Inhale 3 mLs into the lungs every 4 (four) hours as needed.   ketoconazole (NIZORAL) 2 %  cream Apply 1 application  topically daily as needed for irritation.   menthol-cetylpyridinium (CEPACOL) 3 MG lozenge Take 1 lozenge (3 mg total) by mouth every 4 (four) hours as needed for sore throat.   nystatin cream (MYCOSTATIN) Apply 1 application topically 2 (two) times daily as needed for dry skin (apply to perirectal rash).   ondansetron (ZOFRAN) 4 MG tablet Take 4 mg by mouth every 6 (six) hours as needed for nausea or vomiting.   OXYGEN Inhale 3 L into the lungs as directed. to maintain sats > or equal to 90%. May titrate Three Times A Day; 07:00 AM - 03:00 PM, 03:00 PM - 11:00 PM, 11:00 PM - 7:00 am   pantoprazole (PROTONIX) 40 MG tablet Take 40 mg by mouth 2 (two) times daily.   Polyethyl Glycol-Propyl Glycol (SYSTANE) 0.4-0.3 % SOLN Apply 2 drops to eye every 6 (six) hours as needed (dry eyes).   polyethylene glycol powder (GLYCOLAX/MIRALAX) 17 GM/SCOOP powder Take 17 g by mouth every other day.   predniSONE (DELTASONE) 20 MG tablet Take 1 tablet (20 mg total) by mouth daily with breakfast for 5 days.   sennosides-docusate sodium (SENOKOT-S) 8.6-50 MG tablet Take 1 tablet by mouth daily.   Tiotropium Bromide-Olodaterol (STIOLTO RESPIMAT) 2.5-2.5 MCG/ACT AERS Inhale 2 puffs into the lungs daily.   Facility-Administered Encounter Medications as of 06/03/2023  Medication   botulinum toxin Type A (BOTOX) injection 100 Units    Review of Systems  Constitutional:  Negative for activity change.  HENT:  Positive for congestion. Negative for sore throat.   Respiratory:  Positive for cough and shortness of breath.   Cardiovascular:  Negative for chest pain and leg swelling.  Genitourinary:  Negative for dysuria.  Musculoskeletal:  Positive for gait problem.  Neurological:  Positive for weakness. Negative for dizziness.  Psychiatric/Behavioral:  Negative for dysphoric mood. The patient is not nervous/anxious.     Immunization History  Administered Date(s) Administered   Fluad  Quad(high Dose 65+) 02/05/2022, 01/08/2023   Influenza Split 01/16/2009, 04/30/2010, 01/26/2016, 02/07/2018, 02/01/2020   Influenza, High Dose Seasonal PF 02/07/2018, 01/25/2019, 02/28/2021, 02/05/2022   Influenza,inj,Quad PF,6+ Mos 03/06/2012, 01/26/2016, 01/10/2017   Influenza,inj,quad, With Preservative 02/14/2015   Moderna Covid-19 Vaccine Bivalent Booster 25yrs & up 02/28/2021, 02/04/2023   PFIZER(Purple Top)SARS-COV-2 Vaccination 05/06/2019, 05/27/2019, 01/27/2020, 09/18/2020   Pneumococcal Conjugate-13 03/19/2017   Pneumococcal Polysaccharide-23 02/14/2015   Respiratory Syncytial Virus Vaccine,Recomb Aduvanted(Arexvy) 02/15/2022  Td 02/25/2005   Tdap 05/19/2013   Zoster, Live 07/28/2006   Pertinent  Health Maintenance Due  Topic Date Due   INFLUENZA VACCINE  Completed   DEXA SCAN  Discontinued      05/07/2022   11:59 AM 08/13/2022   11:02 AM 11/26/2022   12:34 PM 12/31/2022    3:26 PM 03/11/2023    2:48 PM  Fall Risk  Falls in the past year? 0 1 0 0 1  Was there an injury with Fall? 0 0 0 0 0  Fall Risk Category Calculator 0 1 0 0 1  Patient at Risk for Falls Due to No Fall Risks History of fall(s);Impaired balance/gait;Impaired mobility History of fall(s);Impaired balance/gait History of fall(s);Impaired balance/gait;Impaired mobility History of fall(s);Impaired balance/gait;Impaired mobility  Patient at Risk for Falls Due to - Comments  right sided hemiparesis     Fall risk Follow up Falls evaluation completed Falls evaluation completed;Education provided;Falls prevention discussed Falls evaluation completed;Education provided Falls evaluation completed;Education provided;Falls prevention discussed Falls evaluation completed;Education provided;Falls prevention discussed   Functional Status Survey:    Vitals:   06/03/23 1612  BP: 137/73  Pulse: 61  Resp: 18  Temp: (!) 97.5 F (36.4 C)  SpO2: 92%  Weight: 86 lb 12.8 oz (39.4 kg)  Height: 5\' 3"  (1.6 m)   Body mass  index is 15.38 kg/m. Physical Exam Vitals reviewed.  Constitutional:      General: She is not in acute distress. HENT:     Head: Normocephalic.  Eyes:     General:        Right eye: No discharge.        Left eye: No discharge.  Cardiovascular:     Rate and Rhythm: Normal rate and regular rhythm.     Pulses: Normal pulses.     Heart sounds: Normal heart sounds.  Pulmonary:     Effort: Pulmonary effort is normal. No respiratory distress.     Breath sounds: Examination of the right-upper field reveals rales. Examination of the left-upper field reveals rales. Rales present. No wheezing.  Abdominal:     General: Bowel sounds are normal.     Palpations: Abdomen is soft.  Musculoskeletal:     Cervical back: Neck supple.     Right lower leg: No edema.     Left lower leg: No edema.  Skin:    General: Skin is warm.     Capillary Refill: Capillary refill takes less than 2 seconds.  Neurological:     General: No focal deficit present.     Mental Status: She is alert and oriented to person, place, and time.     Motor: Weakness present.     Gait: Gait abnormal.  Psychiatric:        Mood and Affect: Mood normal.     Labs reviewed: Recent Labs    03/18/23 0000  NA 140  K 4.4  CL 106  CO2 22  BUN 18  CREATININE 0.8  CALCIUM 10.0   Recent Labs    03/18/23 0000  AST 27  ALT 23  ALKPHOS 96  ALBUMIN 4.2   Recent Labs    09/19/22 0000 03/18/23 0000  WBC 7.4 4.0  HGB 14.0 12.5  HCT 43 38  PLT 246 180   Lab Results  Component Value Date   TSH 0.61 08/08/2022   Lab Results  Component Value Date   HGBA1C 6.0 (H) 11/13/2020   Lab Results  Component Value Date   CHOL  164 03/18/2023   HDL 61 03/18/2023   LDLCALC 67 03/18/2023   TRIG 179 (A) 03/18/2023   CHOLHDL 3.0 11/13/2020    Significant Diagnostic Results in last 30 days:  No results found.  Assessment/Plan 1. Chronic diastolic congestive heart failure (HCC) (Primary) - ongoing cough since 02/11 -  sometimes productive - 02/11 started on doxycycline and prednisone - recent CXR negative for infiltrates or effusion, mild CHF noted - rales noted on exam, no ankle edema, no weight gain - start furosemide 20 mg po QAM x 3 days - start KCL 10 meq po QAM x 3 days - consider BNP if no improvement  - consider repeat CXR if no improvement   Family/ staff Communication: plan discussed with patient and nurse  Labs/tests ordered:  none

## 2023-06-05 ENCOUNTER — Encounter: Payer: Self-pay | Admitting: Adult Health

## 2023-06-05 ENCOUNTER — Non-Acute Institutional Stay (SKILLED_NURSING_FACILITY): Payer: Medicare Other | Admitting: Adult Health

## 2023-06-05 DIAGNOSIS — J9611 Chronic respiratory failure with hypoxia: Secondary | ICD-10-CM

## 2023-06-05 DIAGNOSIS — K5901 Slow transit constipation: Secondary | ICD-10-CM

## 2023-06-05 DIAGNOSIS — J439 Emphysema, unspecified: Secondary | ICD-10-CM

## 2023-06-05 DIAGNOSIS — I5032 Chronic diastolic (congestive) heart failure: Secondary | ICD-10-CM | POA: Diagnosis not present

## 2023-06-05 DIAGNOSIS — J40 Bronchitis, not specified as acute or chronic: Secondary | ICD-10-CM

## 2023-06-05 MED ORDER — PREDNISONE 20 MG PO TABS
20.0000 mg | ORAL_TABLET | Freq: Every day | ORAL | Status: AC
Start: 2023-06-05 — End: 2023-06-10

## 2023-06-05 NOTE — Progress Notes (Signed)
 Location:  Medical illustrator of Service:  SNF (31) Provider:   Peggye Ley, ANP [facility] [phone]   [name], MD  Patient Care Team: [name], MD as PCP - General (Internal Medicine) [name], MD as PCP - Cardiology (Cardiology) Duke Salvia, MD as PCP - Electrophysiology (Cardiology) Shirlean Kelly, MD as Consulting Physician (Neurosurgery)  Extended Emergency Contact Information Primary Emergency Contact: Methot,[name] Address: [address]          [place] [phone] [name] Home Phone: [phone] Mobile Phone: [phone] Relation: Spouse Secondary Emergency Contact: Reister,[name] Address: [address]          [place], [place] [address] [name] Mobile Phone: [phone] Relation: Son  Code Status:  Full Goals of care: Advanced Directive information    03/31/2023    5:02 PM  Advanced Directives  Does Patient Have a Medical Advance Directive? Yes  Would patient like information on creating a medical advance directive? No - Patient declined     Chief Complaint  Patient presents with   Acute Visit    Nausea and hypoxia     HPI:  Pt is a 78 y.o. female seen today for an acute visit for nausea and hypoxia.   PMH: COPD, oxygen dependency, afib, CHF, PAD, CVA, HTN  She was seen on 2/14 for cough and sputum production. Started on prednisone and doxycycline. Symptoms improved after 2-3 days but then returned. She still has a congested cough but no sob. No sputum production. No fever. Her nurse reports she is requiring 4 liters of oxygen to keep her sats at 90%.  Typically she requires 2 liters sometimes 3.  Has had two negative covid/flu swabs.   She was seen by Amy NP on 06/03/23 She was prescribed lasix for 3 days due to a CXR showing mild CHF and some rales on exam. No weight gain or edema.   Also she had an episode of nausea and vomiting x 1.  Ms. Savannah feels this was transitory. She no longer feels symptoms. Did take zofran. She only has a BM every 6 days or so. Does not take miralax as prescribed.    Past Medical History:  Diagnosis Date   Anxiety    Arthritis    "some in my lower back; probably elbows, knees" (11/18/2017)   Atrial fibrillation (HCC)    Bell's palsy    when pt. was 78 yrs old, when under stress the left side of face will droop.   Complication of anesthesia    "vascular OR 2016; BP bottomed out; couldn't get it regulated; ended up in ICU for DAYS" (11/18/2017)   GERD (gastroesophageal reflux disease)    History of kidney stones    Hypertension    Hypertrophic cardiomyopathy (HCC)    severe LV basilar hypertrophy witn no evidence of significant outflow tract obstruction, EF 65-70%, mild LAE, mild TR, grade 1a diastolic dysfunction 05/15/10 (Dr. Donato Schultz) (Atrial Septal Hypertrophy pattern)-- Intra-op TEE with dsignificant outflow tract obstruction - AI, MR & TR   Insomnia    Mild aortic sclerosis    Osteopenia    Peripheral vascular disease (HCC)    Syncope    , Vagal   Past Surgical History:  Procedure Laterality Date   AUGMENTATION MAMMAPLASTY Bilateral    BACK SURGERY     CARDIAC CATHETERIZATION N/A 05/07/2016   Procedure: Left Heart Cath and Coronary Angiography;  Surgeon: Kathleene Hazel, MD;  Location: [facility] INVASIVE CV LAB;  Service: Cardiovascular;  Laterality: N/A;   CARDIOVERSION N/A 09/24/2017   Procedure: CARDIOVERSION;  Surgeon: Laurey Morale, MD;  Location: [facility] ENDOSCOPY;  Service: Cardiovascular;  Laterality: N/A;   DILATION AND CURETTAGE OF UTERUS     ENDARTERECTOMY FEMORAL Right 03/02/2015   Procedure: ENDARTERECTOMY RIGHT FEMORAL;  Surgeon: Chuck Hint, MD;  Location: [facility] OR;  Service: Vascular;  Laterality: Right;   ESOPHAGOGASTRODUODENOSCOPY (EGD) WITH PROPOFOL N/A 12/01/2020   Procedure: ESOPHAGOGASTRODUODENOSCOPY (EGD) WITH PROPOFOL;  Surgeon: Diamantina Monks, MD;   Location: [facility] ENDOSCOPY;  Service: General;  Laterality: N/A;   FACIAL COSMETIC SURGERY Left 2002   "related to Bell's Palsy @ age 56; left eye/side of face droopy; tried to make area symmetrical"   FEMORAL-POPLITEAL BYPASS GRAFT Right 03/02/2015   Procedure: BYPASS GRAFT FEMORAL-BELOW KNEE POPLITEAL ARTERY;  Surgeon: Chuck Hint, MD;  Location: [facility] OR;  Service: Vascular;  Laterality: Right;   INGUINAL HERNIA REPAIR Bilateral 2002   IR ANGIO INTRA EXTRACRAN SEL COM CAROTID INNOMINATE UNI L MOD SED  11/16/2020   IR CT HEAD LTD  11/13/2020   IR PERCUTANEOUS ART THROMBECTOMY/INFUSION INTRACRANIAL INC DIAG ANGIO  11/13/2020   OVARIAN CYST REMOVAL Left    PEG PLACEMENT N/A 12/01/2020   Procedure: PERCUTANEOUS ENDOSCOPIC GASTROSTOMY (PEG) PLACEMENT;  Surgeon: Diamantina Monks, MD;  Location: [facility] ENDOSCOPY;  Service: General;  Laterality: N/A;   PERIPHERAL VASCULAR CATHETERIZATION N/A 01/16/2015   Procedure: Abdominal Aortogram;  Surgeon: Chuck Hint, MD;  Location: [facility] INVASIVE CV LAB;  Service: Cardiovascular;  Laterality: N/A;   POSTERIOR LUMBAR FUSION  2015   "have plates and screws in there"   RADIOLOGY WITH ANESTHESIA N/A 11/13/2020   Procedure: IR WITH ANESTHESIA;  Surgeon: Radiologist, Medication, MD;  Location: [facility] OR;  Service: Radiology;  Laterality: N/A;   TONSILLECTOMY      Allergies  Allergen Reactions   Amoxicillin Other (See Comments)    UTI Has patient had a PCN reaction causing immediate rash, facial/tongue/throat swelling, SOB or lightheadedness with hypotension: No Has patient had a PCN reaction causing severe rash involving mucus membranes or skin necrosis: No Has patient had a PCN reaction that required hospitalization: No Has patient had a PCN reaction occurring within the last 10 years: Yes--UTI ONLY If all of the above answers are "NO", then may proceed with Cephalosporin use.    Atenolol Cough   Crestor [Rosuvastatin Calcium] Other (See Comments)    Muscle  aches   Pravastatin Other (See Comments)    Muscle aches   Rosuvastatin Calcium    Sulfa Antibiotics Nausea Only   Codeine Other (See Comments)    hallucinations    Outpatient Encounter Medications as of 06/05/2023  Medication Sig   acetaminophen (TYLENOL) 325 MG tablet Take 650 mg by mouth every 6 (six) hours as needed for mild pain or fever.   albuterol (VENTOLIN HFA) 108 (90 Base) MCG/ACT inhaler Inhale 2 puffs into the lungs every 6 (six) hours as needed for wheezing or shortness of breath.   amiodarone (PACERONE) 200 MG tablet Take 100 mg by mouth daily. May give crushed   amLODipine (NORVASC) 5 MG tablet Take 2.5 mg by mouth daily.   apixaban (ELIQUIS) 5 MG TABS tablet Take 5 mg by mouth 2 (two) times daily. May give crushed   bisacodyl (DULCOLAX) 5 MG EC tablet Take 5 mg by mouth 2 (two) times daily as needed for moderate constipation.  buPROPion (WELLBUTRIN XL) 150 MG 24 hr tablet Take 150 mg by mouth daily.   cetirizine (ZYRTEC) 10 MG tablet Take 1 tablet (10 mg total) by mouth daily for 14 days.   diclofenac Sodium (VOLTAREN) 1 % GEL Apply one application to right hip and knee four times daily as needed.   doxycycline (VIBRA-TABS) 100 MG tablet Take 1 tablet (100 mg total) by mouth 2 (two) times daily for 7 days.   Emollient (CERAVE) CREA Apply 1 Application topically as needed.   ezetimibe (ZETIA) 10 MG tablet Take 10 mg by mouth daily. Oral if crushed   fluticasone (FLONASE) 50 MCG/ACT nasal spray Place 2 sprays into both nostrils daily for 14 days.   furosemide (LASIX) 20 MG tablet Take 20 mg by mouth daily as needed. Give Lasix 20mg  for weight gain equal or greater than 3 pounds   furosemide (LASIX) 20 MG tablet Take 1 tablet (20 mg total) by mouth daily for 3 days.   hydrALAZINE (APRESOLINE) 10 MG tablet Take 10 mg by mouth 3 (three) times daily as needed (SBP > 165 or DBP > 95).   hydrocortisone cream 1 % Apply 1 Application topically 2 (two) times daily as needed for  itching.   ipratropium-albuterol (DUONEB) 0.5-2.5 (3) MG/3ML SOLN Inhale 3 mLs into the lungs every 4 (four) hours as needed.   ketoconazole (NIZORAL) 2 % cream Apply 1 application  topically daily as needed for irritation.   menthol-cetylpyridinium (CEPACOL) 3 MG lozenge Take 1 lozenge (3 mg total) by mouth every 4 (four) hours as needed for sore throat.   nystatin cream (MYCOSTATIN) Apply 1 application topically 2 (two) times daily as needed for dry skin (apply to perirectal rash).   ondansetron (ZOFRAN) 4 MG tablet Take 4 mg by mouth every 6 (six) hours as needed for nausea or vomiting.   OXYGEN Inhale 3 L into the lungs as directed. to maintain sats > or equal to 90%. May titrate Three Times A Day; 07:00 AM - 03:00 PM, 03:00 PM - 11:00 PM, 11:00 PM - 7:00 am   pantoprazole (PROTONIX) 40 MG tablet Take 40 mg by mouth 2 (two) times daily.   Polyethyl Glycol-Propyl Glycol (SYSTANE) 0.4-0.3 % SOLN Apply 2 drops to eye every 6 (six) hours as needed (dry eyes).   polyethylene glycol powder (GLYCOLAX/MIRALAX) 17 GM/SCOOP powder Take 17 g by mouth every other day.   potassium chloride SA (KLOR-CON M) 20 MEQ tablet Take 0.5 tablets (10 mEq total) by mouth daily for 3 days.   predniSONE (DELTASONE) 20 MG tablet Take 1 tablet (20 mg total) by mouth daily with breakfast for 5 days.   sennosides-docusate sodium (SENOKOT-S) 8.6-50 MG tablet Take 2 tablets by mouth 2 (two) times daily.   Tiotropium Bromide-Olodaterol (STIOLTO RESPIMAT) 2.5-2.5 MCG/ACT AERS Inhale 2 puffs into the lungs daily.   [DISCONTINUED] predniSONE (DELTASONE) 20 MG tablet Take 1 tablet (20 mg total) by mouth daily with breakfast for 5 days.   Facility-Administered Encounter Medications as of 06/05/2023  Medication   botulinum toxin Type A (BOTOX) injection 100 Units    Review of Systems  Constitutional:  Negative for activity change, appetite change, chills, diaphoresis, fatigue, fever and unexpected weight change.  HENT:  Negative  for congestion.   Respiratory:  Positive for cough. Negative for shortness of breath and wheezing.   Cardiovascular:  Negative for chest pain, palpitations and leg swelling.  Gastrointestinal:  Positive for constipation, nausea and vomiting. Negative for abdominal distention, abdominal pain  and diarrhea.  Genitourinary:  Negative for difficulty urinating and dysuria.  Musculoskeletal:  Positive for gait problem. Negative for arthralgias, back pain, joint swelling and myalgias.  Neurological:  Positive for facial asymmetry, speech difficulty and weakness. Negative for dizziness, tremors, seizures, syncope, light-headedness, numbness and headaches.  Psychiatric/Behavioral:  Negative for agitation, behavioral problems and confusion.     Immunization History  Administered Date(s) Administered   Fluad Quad(high Dose 65+) 02/05/2022, 01/08/2023   Influenza Split 01/16/2009, 04/30/2010, 01/26/2016, 02/07/2018, 02/01/2020   Influenza, High Dose Seasonal PF 02/07/2018, 01/25/2019, 02/28/2021, 02/05/2022   Influenza,inj,Quad PF,6+ Mos 03/06/2012, 01/26/2016, 01/10/2017   Influenza,inj,quad, With Preservative 02/14/2015   Moderna Covid-19 Vaccine Bivalent Booster 21yrs & up 02/28/2021, 02/04/2023   PFIZER(Purple Top)SARS-COV-2 Vaccination 05/06/2019, 05/27/2019, 01/27/2020, 09/18/2020   Pneumococcal Conjugate-13 03/19/2017   Pneumococcal Polysaccharide-23 02/14/2015   Respiratory Syncytial Virus Vaccine,Recomb Aduvanted(Arexvy) 02/15/2022   Td 02/25/2005   Tdap 05/19/2013   Zoster, Live 07/28/2006   Pertinent  Health Maintenance Due  Topic Date Due   INFLUENZA VACCINE  Completed   DEXA SCAN  Discontinued      05/07/2022   11:59 AM 08/13/2022   11:02 AM 11/26/2022   12:34 PM 12/31/2022    3:26 PM 03/11/2023    2:48 PM  Fall Risk  Falls in the past year? 0 1 0 0 1  Was there an injury with Fall? 0 0 0 0 0  Fall Risk Category Calculator 0 1 0 0 1  Patient at Risk for Falls Due to No Fall  Risks History of fall(s);Impaired balance/gait;Impaired mobility History of fall(s);Impaired balance/gait History of fall(s);Impaired balance/gait;Impaired mobility History of fall(s);Impaired balance/gait;Impaired mobility  Patient at Risk for Falls Due to - Comments  right sided hemiparesis     Fall risk Follow up Falls evaluation completed Falls evaluation completed;Education provided;Falls prevention discussed Falls evaluation completed;Education provided Falls evaluation completed;Education provided;Falls prevention discussed Falls evaluation completed;Education provided;Falls prevention discussed   Functional Status Survey:    Vitals:   06/05/23 1650  BP: 136/88  Pulse: 90  Temp: 97.6 F (36.4 C)  SpO2: 90%  Weight: 86 lb 12.8 oz (39.4 kg)   Body mass index is 15.38 kg/m. Physical Exam Vitals and nursing note reviewed.  Constitutional:      General: She is not in acute distress.    Appearance: She is not diaphoretic.  HENT:     Head: Normocephalic and atraumatic.  Neck:     Vascular: No JVD.  Cardiovascular:     Rate and Rhythm: Normal rate and regular rhythm.     Heart sounds: No murmur heard. Pulmonary:     Effort: Pulmonary effort is normal. No respiratory distress.     Breath sounds: Rales present. No wheezing.  Abdominal:     General: Bowel sounds are normal. There is no distension.     Palpations: Abdomen is soft.     Tenderness: There is no abdominal tenderness.  Skin:    General: Skin is warm and dry.  Neurological:     Mental Status: She is alert. Mental status is at baseline.     Comments: Right sided weakness      Labs reviewed: Recent Labs    03/18/23 0000  NA 140  K 4.4  CL 106  CO2 22  BUN 18  CREATININE 0.8  CALCIUM 10.0   Recent Labs    03/18/23 0000  AST 27  ALT 23  ALKPHOS 96  ALBUMIN 4.2   Recent Labs  09/19/22 0000 03/18/23 0000  WBC 7.4 4.0  HGB 14.0 12.5  HCT 43 38  PLT 246 180   Lab Results  Component Value Date    TSH 0.61 08/08/2022   Lab Results  Component Value Date   HGBA1C 6.0 (H) 11/13/2020   Lab Results  Component Value Date   CHOL 164 03/18/2023   HDL 61 03/18/2023   LDLCALC 67 03/18/2023   TRIG 179 (A) 03/18/2023   CHOLHDL 3.0 11/13/2020    Significant Diagnostic Results in last 30 days:  No results found.  Assessment/Plan 1. Bronchitis (Primary) Improved after prednisone and doxycycline but now slightly worse Lung sounds are improved from by prior exam with a slight rales.  Will resume prednisone for 5 days  - predniSONE (DELTASONE) 20 MG tablet; Take 1 tablet (20 mg total) by mouth daily with breakfast for 5 days.  2. Chronic respiratory failure with hypoxia (HCC) Slight increase in 02 needs likely due to recent bout of bronchitis, was better when on prednisone see above.  Ok to titrate 02 for sat 88 or greater.   3. Pulmonary emphysema, unspecified emphysema type (HCC) Continues on Stiolto Oxygen therapy  Prn albuterol   4. Chronic diastolic congestive heart failure (HCC) Appears euvolemic On lasix  BP controlled  Followed by cardiology   5. Slow transit constipation Her nausea is likely due to constipation and or the antibiotic.  Increase senna 2 tabs bid Does not prefer miralax     Family/ staff Communication: nurse  Labs/tests ordered:  Consider repeat xray if not improving.

## 2023-06-08 DIAGNOSIS — I5020 Unspecified systolic (congestive) heart failure: Secondary | ICD-10-CM | POA: Diagnosis not present

## 2023-06-09 ENCOUNTER — Non-Acute Institutional Stay (SKILLED_NURSING_FACILITY): Payer: Medicare Other | Admitting: Internal Medicine

## 2023-06-09 ENCOUNTER — Encounter: Payer: Self-pay | Admitting: Internal Medicine

## 2023-06-09 DIAGNOSIS — G8191 Hemiplegia, unspecified affecting right dominant side: Secondary | ICD-10-CM

## 2023-06-09 DIAGNOSIS — I5032 Chronic diastolic (congestive) heart failure: Secondary | ICD-10-CM | POA: Diagnosis not present

## 2023-06-09 DIAGNOSIS — I480 Paroxysmal atrial fibrillation: Secondary | ICD-10-CM

## 2023-06-09 DIAGNOSIS — J40 Bronchitis, not specified as acute or chronic: Secondary | ICD-10-CM | POA: Diagnosis not present

## 2023-06-09 NOTE — Progress Notes (Unsigned)
 Location:  [facility] Room Number: 116/A Place of Service:  SNF [phone]) Provider:  [name], MD  Patient Care Team: [name], MD as PCP - General (Internal Medicine) [name], MD as PCP - Cardiology (Cardiology) Duke Salvia, MD as PCP - Electrophysiology (Cardiology) Shirlean Kelly, MD as Consulting Physician (Neurosurgery)  Extended Emergency Contact Information Primary Emergency Contact: Livers,[name] Address: [address]          [place] [phone] [name] Home Phone: [phone] Mobile Phone: [phone] Relation: Spouse Secondary Emergency Contact: Peel,[name] Address: [address]          [place], [place] [address] [name] Mobile Phone: [phone] Relation: Son  Code Status:  Full Code Goals of care: Advanced Directive information    06/09/2023   10:43 AM  Advanced Directives  Does Patient Have a Medical Advance Directive? Yes  Type of Estate agent of [place];Living will  Does patient want to make changes to medical advance directive? No - Patient declined  Copy of Healthcare Power of Attorney in Chart? No - copy requested     Chief Complaint  Patient presents with   Acute Visit    SOB    HPI:  Pt is a 78 y.o. 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  COPD on 2 l of Oxygen   Seen on 05/27/2023 for Nasal congestion Treated conservatively Seen again on 05/30/2023 Doxycyline for 7 days and Prednisone Chest Xray showed Mild Vascular Congestion seen on 02/18 with Lasix 20 mg  for 3 days Seen again on 02/20 given Prednisone for 5 days  But over the weekend Patient started feeling poorly again Feeling weak tired C/o SOB on 4 litres nowContinues with Cough but mostly dry Has  actually lost weight Poor Appetite No Fever Chest pain  Did nto want to go to ED Ordered Nebs Q 4  Ordered Labs K 3.4 BUN elevated 44 Creat 0.86 BNP 412 WBC 11.30 ALT 181 AST 85 Started on Potassium for 5dys  Feels Little better On Nebs Still has cough Still on 4l onf Oxygen Feels weak and tired    Past Medical History:  Diagnosis Date   Anxiety    Arthritis    "some in my lower back; probably elbows, knees" (11/18/2017)   Atrial fibrillation (HCC)    Bell's palsy    when pt. was 78 yrs old, when under stress the left side of face will droop.   Complication of anesthesia    "vascular OR 2016; BP bottomed out; couldn't get it regulated; ended up in ICU for DAYS" (11/18/2017)   GERD (gastroesophageal reflux disease)    History of kidney stones    Hypertension    Hypertrophic cardiomyopathy (HCC)    severe LV basilar hypertrophy witn no evidence of significant outflow tract obstruction, EF 65-70%, mild LAE, mild TR, grade 1a diastolic dysfunction 05/15/10 (Dr. Donato Schultz) (Atrial Septal Hypertrophy pattern)-- Intra-op TEE with dsignificant outflow tract obstruction - AI, MR & TR   Insomnia    Mild aortic sclerosis    Osteopenia    Peripheral vascular disease (HCC)    Syncope    , Vagal   Past Surgical History:  Procedure Laterality Date  AUGMENTATION MAMMAPLASTY Bilateral    BACK SURGERY     CARDIAC CATHETERIZATION N/A 05/07/2016   Procedure: Left Heart Cath and Coronary Angiography;  Surgeon: Kathleene Hazel, MD;  Location: [facility] INVASIVE CV LAB;  Service: Cardiovascular;  Laterality: N/A;   CARDIOVERSION N/A 09/24/2017   Procedure: CARDIOVERSION;  Surgeon: Laurey Morale, MD;  Location: [facility] ENDOSCOPY;  Service: Cardiovascular;  Laterality: N/A;   DILATION AND CURETTAGE OF UTERUS     ENDARTERECTOMY FEMORAL Right 03/02/2015   Procedure: ENDARTERECTOMY RIGHT FEMORAL;  Surgeon: Chuck Hint, MD;  Location: [name] OR;  Service: Vascular;  Laterality: Right;    ESOPHAGOGASTRODUODENOSCOPY (EGD) WITH PROPOFOL N/A 12/01/2020   Procedure: ESOPHAGOGASTRODUODENOSCOPY (EGD) WITH PROPOFOL;  Surgeon: Diamantina Monks, MD;  Location: [facility] ENDOSCOPY;  Service: General;  Laterality: N/A;   FACIAL COSMETIC SURGERY Left 2002   "related to Bell's Palsy @ age 56; left eye/side of face droopy; tried to make area symmetrical"   FEMORAL-POPLITEAL BYPASS GRAFT Right 03/02/2015   Procedure: BYPASS GRAFT FEMORAL-BELOW KNEE POPLITEAL ARTERY;  Surgeon: Chuck Hint, MD;  Location: [facility] OR;  Service: Vascular;  Laterality: Right;   INGUINAL HERNIA REPAIR Bilateral 2002   IR ANGIO INTRA EXTRACRAN SEL COM CAROTID INNOMINATE UNI L MOD SED  11/16/2020   IR CT HEAD LTD  11/13/2020   IR PERCUTANEOUS ART THROMBECTOMY/INFUSION INTRACRANIAL INC DIAG ANGIO  11/13/2020   OVARIAN CYST REMOVAL Left    PEG PLACEMENT N/A 12/01/2020   Procedure: PERCUTANEOUS ENDOSCOPIC GASTROSTOMY (PEG) PLACEMENT;  Surgeon: Diamantina Monks, MD;  Location: [facility] ENDOSCOPY;  Service: General;  Laterality: N/A;   PERIPHERAL VASCULAR CATHETERIZATION N/A 01/16/2015   Procedure: Abdominal Aortogram;  Surgeon: Chuck Hint, MD;  Location[facility] INVASIVE CV LAB;  Service: Cardiovascular;  Laterality: N/A;   POSTERIOR LUMBAR FUSION  2015   "have plates and screws in there"   RADIOLOGY WITH ANESTHESIA N/A 11/13/2020   Procedure: IR WITH ANESTHESIA;  Surgeon: Radiologist, Medication, MD;  Location: [facility] OR;  Service: Radiology;  Laterality: N/A;   TONSILLECTOMY      Allergies  Allergen Reactions   Amoxicillin Other (See Comments)    UTI Has patient had a PCN reaction causing immediate rash, facial/tongue/throat swelling, SOB or lightheadedness with hypotension: No Has patient had a PCN reaction causing severe rash involving mucus membranes or skin necrosis: No Has patient had a PCN reaction that required hospitalization: No Has patient had a PCN reaction occurring within the last 10 years: Yes--UTI ONLY If all of  the above answers are "NO", then may proceed with Cephalosporin use.    Atenolol Cough   Crestor [Rosuvastatin Calcium] Other (See Comments)    Muscle aches   Pravastatin Other (See Comments)    Muscle aches   Rosuvastatin Calcium    Sulfa Antibiotics Nausea Only   Codeine Other (See Comments)    hallucinations    Outpatient Encounter Medications as of 06/09/2023  Medication Sig   acetaminophen (TYLENOL) 325 MG tablet Take 650 mg by mouth every 6 (six) hours as needed for mild pain or fever.   albuterol (VENTOLIN HFA) 108 (90 Base) MCG/ACT inhaler Inhale 2 puffs into the lungs every 6 (six) hours as needed for wheezing or shortness of breath.   amiodarone (PACERONE) 200 MG tablet Take 100 mg by mouth daily. May give crushed   amLODipine (NORVASC) 5 MG tablet Take 2.5 mg by mouth daily.   apixaban (ELIQUIS) 5 MG TABS tablet Take 5 mg by mouth 2 (two) times  daily. May give crushed   bisacodyl (DULCOLAX) 5 MG EC tablet Take 5 mg by mouth 2 (two) times daily as needed for moderate constipation.   buPROPion (WELLBUTRIN XL) 150 MG 24 hr tablet Take 150 mg by mouth daily.   cetirizine (ZYRTEC) 10 MG tablet Take 1 tablet (10 mg total) by mouth daily for 14 days.   diclofenac Sodium (VOLTAREN) 1 % GEL Apply one application to right hip and knee four times daily as needed.   Emollient (CERAVE) CREA Apply 1 Application topically as needed.   ezetimibe (ZETIA) 10 MG tablet Take 10 mg by mouth daily. Oral if crushed   fluticasone (FLONASE) 50 MCG/ACT nasal spray Place 2 sprays into both nostrils daily for 14 days.   furosemide (LASIX) 20 MG tablet Take 20 mg by mouth daily as needed. Give Lasix 20mg  for weight gain equal or greater than 3 pounds   hydrALAZINE (APRESOLINE) 10 MG tablet Take 10 mg by mouth 3 (three) times daily as needed (SBP > 165 or DBP > 95).   hydrocortisone cream 1 % Apply 1 Application topically 2 (two) times daily as needed for itching.   ipratropium-albuterol (DUONEB) 0.5-2.5  (3) MG/3ML SOLN Inhale 3 mLs into the lungs every 4 (four) hours as needed.   ketoconazole (NIZORAL) 2 % cream Apply 1 application  topically daily as needed for irritation.   menthol-cetylpyridinium (CEPACOL) 3 MG lozenge Take 1 lozenge (3 mg total) by mouth every 4 (four) hours as needed for sore throat.   nystatin cream (MYCOSTATIN) Apply 1 application topically 2 (two) times daily as needed for dry skin (apply to perirectal rash).   ondansetron (ZOFRAN) 4 MG tablet Take 4 mg by mouth every 6 (six) hours as needed for nausea or vomiting.   OXYGEN Inhale 3 L into the lungs as directed. to maintain sats > or equal to 90%. May titrate Three Times A Day; 07:00 AM - 03:00 PM, 03:00 PM - 11:00 PM, 11:00 PM - 7:00 am   pantoprazole (PROTONIX) 40 MG tablet Take 40 mg by mouth 2 (two) times daily.   Polyethyl Glycol-Propyl Glycol (SYSTANE) 0.4-0.3 % SOLN Apply 2 drops to eye every 6 (six) hours as needed (dry eyes).   polyethylene glycol powder (GLYCOLAX/MIRALAX) 17 GM/SCOOP powder Take 17 g by mouth every other day.   Potassium Chloride ER 20 MEQ TBCR Take 20 mEq by mouth daily.   predniSONE (DELTASONE) 20 MG tablet Take 1 tablet (20 mg total) by mouth daily with breakfast for 5 days.   sennosides-docusate sodium (SENOKOT-S) 8.6-50 MG tablet Take 2 tablets by mouth 2 (two) times daily.   Tiotropium Bromide-Olodaterol (STIOLTO RESPIMAT) 2.5-2.5 MCG/ACT AERS Inhale 2 puffs into the lungs daily.   zinc oxide 20 % ointment Apply 1 Application topically 2 (two) times daily as needed for irritation. apply to peri rectal area BID PRN for rash-use with Nystatin   [DISCONTINUED] Potassium Chloride ER 20 MEQ TBCR Take 20 mEq by mouth daily. Once A Morning; 09:00 AM   furosemide (LASIX) 20 MG tablet Take 1 tablet (20 mg total) by mouth daily for 3 days.   potassium chloride SA (KLOR-CON M) 20 MEQ tablet Take 0.5 tablets (10 mEq total) by mouth daily for 3 days.   Facility-Administered Encounter Medications as of  06/09/2023  Medication   botulinum toxin Type A (BOTOX) injection 100 Units    Review of Systems  Constitutional:  Positive for activity change and appetite change.  HENT: Negative.  Respiratory:  Positive for cough and shortness of breath.   Cardiovascular:  Negative for leg swelling.  Gastrointestinal:  Negative for constipation.  Genitourinary: Negative.   Musculoskeletal:  Negative for arthralgias, gait problem and myalgias.  Skin: Negative.   Neurological:  Positive for weakness. Negative for dizziness.  Psychiatric/Behavioral:  Negative for confusion, dysphoric mood and sleep disturbance.     Immunization History  Administered Date(s) Administered   Fluad Quad(high Dose 65+) 02/05/2022, 01/08/2023   Influenza Split 01/16/2009, 04/30/2010, 01/26/2016, 02/07/2018, 02/01/2020   Influenza, High Dose Seasonal PF 02/07/2018, 01/25/2019, 02/28/2021, 02/05/2022   Influenza,inj,Quad PF,6+ Mos 03/06/2012, 01/26/2016, 01/10/2017   Influenza,inj,quad, With Preservative 02/14/2015   Moderna Covid-19 Vaccine Bivalent Booster 23yrs & up 02/28/2021, 02/04/2023   PFIZER(Purple Top)SARS-COV-2 Vaccination 05/06/2019, 05/27/2019, 01/27/2020, 09/18/2020   Pneumococcal Conjugate-13 03/19/2017   Pneumococcal Polysaccharide-23 02/14/2015   Respiratory Syncytial Virus Vaccine,Recomb Aduvanted(Arexvy) 02/15/2022   Td 02/25/2005   Tdap 05/19/2013   Zoster, Live 07/28/2006   Pertinent  Health Maintenance Due  Topic Date Due   INFLUENZA VACCINE  Completed   DEXA SCAN  Discontinued      05/07/2022   11:59 AM 08/13/2022   11:02 AM 11/26/2022   12:34 PM 12/31/2022    3:26 PM 03/11/2023    2:48 PM  Fall Risk  Falls in the past year? 0 1 0 0 1  Was there an injury with Fall? 0 0 0 0 0  Fall Risk Category Calculator 0 1 0 0 1  Patient at Risk for Falls Due to No Fall Risks History of fall(s);Impaired balance/gait;Impaired mobility History of fall(s);Impaired balance/gait History of fall(s);Impaired  balance/gait;Impaired mobility History of fall(s);Impaired balance/gait;Impaired mobility  Patient at Risk for Falls Due to - Comments  right sided hemiparesis     Fall risk Follow up Falls evaluation completed Falls evaluation completed;Education provided;Falls prevention discussed Falls evaluation completed;Education provided Falls evaluation completed;Education provided;Falls prevention discussed Falls evaluation completed;Education provided;Falls prevention discussed   Functional Status Survey:    Vitals:   06/09/23 1013  BP: 134/74  Pulse: 61  Resp: 19  Temp: (!) 97 F (36.1 C)  SpO2: 92%  Weight: 86 lb 12.8 oz (39.4 kg)  Height: 5\' 3"  (1.6 m)   Body mass index is 15.38 kg/m. Physical Exam Vitals reviewed.  Constitutional:      Appearance: Normal appearance.  HENT:     Head: Normocephalic.     Nose: Nose normal.     Mouth/Throat:     Mouth: Mucous membranes are moist.     Pharynx: Oropharynx is clear.  Eyes:     Pupils: Pupils are equal, round, and reactive to light.  Cardiovascular:     Rate and Rhythm: Normal rate and regular rhythm.     Pulses: Normal pulses.     Heart sounds: Normal heart sounds. No murmur heard. Pulmonary:     Effort: Pulmonary effort is normal.     Comments: Decreased Breadth sound on Right  No Wheezing Abdominal:     General: Abdomen is flat. Bowel sounds are normal.     Palpations: Abdomen is soft.  Musculoskeletal:        General: No swelling.     Cervical back: Neck supple.  Skin:    General: Skin is warm.  Neurological:     Mental Status: She is alert and oriented to person, place, and time.  Psychiatric:        Mood and Affect: Mood normal.        Thought  Content: Thought content normal.     Labs reviewed: Recent Labs    03/18/23 0000 05/21/23 0000  NA 140 142  K 4.4 4.2  CL 106 107  CO2 22 25*  BUN 18 24*  CREATININE 0.8 1.0  CALCIUM 10.0 8.9   Recent Labs    03/18/23 0000 05/21/23 0000  AST 27 30  ALT 23 35   ALKPHOS 96 93  ALBUMIN 4.2 3.4*   Recent Labs    09/19/22 0000 03/18/23 0000 05/21/23 0000  WBC 7.4 4.0 7.2  HGB 14.0 12.5 13.6  HCT 43 38 41  PLT 246 180 246   Lab Results  Component Value Date   TSH 0.61 08/08/2022   Lab Results  Component Value Date   HGBA1C 6.0 (H) 11/13/2020   Lab Results  Component Value Date   CHOL 164 03/18/2023   HDL 61 03/18/2023   LDLCALC 67 03/18/2023   TRIG 179 (A) 03/18/2023   CHOLHDL 3.0 11/13/2020    Significant Diagnostic Results in last 30 days:  No results found.  Assessment/Plan 1. Bronchitis (Primary) Continuous to have symptoms On Nebs Prednisone Will Repeat Chest Xray  Again Asked and discussed about going to ED Right now she does not want to Will Consider if keeps getting worse  2. Chronic diastolic congestive heart failure (HCC) BNP was mildily elevated But her BUN is high Has lost almost 8-9 Pounds Will Use Lasix PRN Do not look Overloaded  3. Right hemiplegia (HCC)   4. PAF (paroxysmal atrial fibrillation) (HCC) Eliquis and Amiodarone  5 Mild Elevated LFTS She is asymptomatic Doe shave h/o Gall stones and is also on Amiodarone Will repeat Hepatic panel in few days  Family/ staff Communication: Discussed with [place]  Labs/tests ordered:

## 2023-06-10 ENCOUNTER — Non-Acute Institutional Stay (SKILLED_NURSING_FACILITY): Payer: Medicare Other | Admitting: Orthopedic Surgery

## 2023-06-10 ENCOUNTER — Encounter: Payer: Self-pay | Admitting: Orthopedic Surgery

## 2023-06-10 DIAGNOSIS — R748 Abnormal levels of other serum enzymes: Secondary | ICD-10-CM | POA: Diagnosis not present

## 2023-06-10 DIAGNOSIS — E440 Moderate protein-calorie malnutrition: Secondary | ICD-10-CM | POA: Diagnosis not present

## 2023-06-10 DIAGNOSIS — J189 Pneumonia, unspecified organism: Secondary | ICD-10-CM

## 2023-06-10 MED ORDER — LEVOFLOXACIN 750 MG PO TABS: 750.0000 mg | ORAL_TABLET | Freq: Once | ORAL | Status: AC

## 2023-06-10 MED ORDER — LEVOFLOXACIN 500 MG PO TABS: 500.0000 mg | ORAL_TABLET | Freq: Every day | ORAL | Status: AC

## 2023-06-10 NOTE — Progress Notes (Signed)
 Location:  [facility] Room Number: 116/A Place of Service:  SNF [phone]) Provider:  Octavia Heir, NP   [name], MD  Patient Care Team: [name], MD as PCP - General (Internal Medicine) [name], MD as PCP - Cardiology (Cardiology) Duke Salvia, MD as PCP - Electrophysiology (Cardiology) Shirlean Kelly, MD as Consulting Physician (Neurosurgery)  Extended Emergency Contact Information Primary Emergency Contact: Holsworth,[name] Address: [address]          [place] [phone] [name] Home Phone: [phone] Mobile Phone: [phone] Relation: Spouse Secondary Emergency Contact: Holloman,[name] Address: [address]          [place], [place] [address] [name] Mobile Phone: [phone] Relation: Son  Code Status:  Full code Goals of care: Advanced Directive information    06/09/2023   10:43 AM  Advanced Directives  Does Patient Have a Medical Advance Directive? Yes  Type of Estate agent of [place];Living will  Does patient want to make changes to medical advance directive? No - Patient declined  Copy of Healthcare Power of Attorney in Chart? No - copy requested     Chief Complaint  Patient presents with   Acute Visit    Acute cough    HPI:  Pt is a 78 y.o. female seen today for acute visit due to ongoing cough.   She currently resides on the skilled nursing unit at [facility]. 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   02/11 c/o worsening non productive cough. Covid/flu negative. She was started on doxycycline and prednisone taper due to concerns of PNA/ bronchitis. She was also given furosemide 20 mg x 3 days due to h/o CHF. 02/17 CXR negative for infiltrates and effusion. 02/23 BNP was 412. Weights have trended down. No  ankle edema or sob. Remains on 3 liters oxygen. 02/24 Dr. Chales Abrahams ordered repeat CXR due to unresolved symptoms. CXR revealed right lower lobe pneumonia and small effusion. Afebrile. Vitals stable.   AST/ALT 85/181 06/08/2023. Denies abdominal pain at this time.   BMI 15.38.   Past Medical History:  Diagnosis Date   Anxiety    Arthritis    "some in my lower back; probably elbows, knees" (11/18/2017)   Atrial fibrillation (HCC)    Bell's palsy    when pt. was 78 yrs old, when under stress the left side of face will droop.   Complication of anesthesia    "vascular OR 2016; BP bottomed out; couldn't get it regulated; ended up in ICU for DAYS" (11/18/2017)   GERD (gastroesophageal reflux disease)    History of kidney stones    Hypertension    Hypertrophic cardiomyopathy (HCC)    severe LV basilar hypertrophy witn no evidence of significant outflow tract obstruction, EF 65-70%, mild LAE, mild TR, grade 1a diastolic dysfunction 05/15/10 (Dr. Donato Schultz) (Atrial Septal Hypertrophy pattern)-- Intra-op TEE with dsignificant outflow tract obstruction - AI, MR & TR   Insomnia    Mild aortic sclerosis    Osteopenia    Peripheral vascular disease (HCC)    Syncope    , Vagal   Past Surgical History:  Procedure Laterality Date   AUGMENTATION MAMMAPLASTY Bilateral    BACK SURGERY     CARDIAC CATHETERIZATION N/A 05/07/2016   Procedure: Left Heart Cath and Coronary Angiography;  Surgeon: Nile Dear  Clifton James, MD;  Location: [facility] INVASIVE CV LAB;  Service: Cardiovascular;  Laterality: N/A;   CARDIOVERSION N/A 09/24/2017   Procedure: CARDIOVERSION;  Surgeon: Laurey Morale, MD;  Location: [facility] ENDOSCOPY;  Service: Cardiovascular;  Laterality: N/A;   DILATION AND CURETTAGE OF UTERUS     ENDARTERECTOMY FEMORAL Right 03/02/2015   Procedure: ENDARTERECTOMY RIGHT FEMORAL;  Surgeon: Chuck Hint, MD;  Location: [facility] OR;  Service: Vascular;  Laterality: Right;   ESOPHAGOGASTRODUODENOSCOPY (EGD) WITH  PROPOFOL N/A 12/01/2020   Procedure: ESOPHAGOGASTRODUODENOSCOPY (EGD) WITH PROPOFOL;  Surgeon: Diamantina Monks, MD;  Location: [facility] ENDOSCOPY;  Service: General;  Laterality: N/A;   FACIAL COSMETIC SURGERY Left 2002   "related to Bell's Palsy @ age 36; left eye/side of face droopy; tried to make area symmetrical"   FEMORAL-POPLITEAL BYPASS GRAFT Right 03/02/2015   Procedure: BYPASS GRAFT FEMORAL-BELOW KNEE POPLITEAL ARTERY;  Surgeon: Chuck Hint, MD;  Location: [facility] OR;  Service: Vascular;  Laterality: Right;   INGUINAL HERNIA REPAIR Bilateral 2002   IR ANGIO INTRA EXTRACRAN SEL COM CAROTID INNOMINATE UNI L MOD SED  11/16/2020   IR CT HEAD LTD  11/13/2020   IR PERCUTANEOUS ART THROMBECTOMY/INFUSION INTRACRANIAL INC DIAG ANGIO  11/13/2020   OVARIAN CYST REMOVAL Left    PEG PLACEMENT N/A 12/01/2020   Procedure: PERCUTANEOUS ENDOSCOPIC GASTROSTOMY (PEG) PLACEMENT;  Surgeon: Diamantina Monks, MD;  Location: [facility] ENDOSCOPY;  Service: General;  Laterality: N/A;   PERIPHERAL VASCULAR CATHETERIZATION N/A 01/16/2015   Procedure: Abdominal Aortogram;  Surgeon: Chuck Hint, MD;  Location: [facility] INVASIVE CV LAB;  Service: Cardiovascular;  Laterality: N/A;   POSTERIOR LUMBAR FUSION  2015   "have plates and screws in there"   RADIOLOGY WITH ANESTHESIA N/A 11/13/2020   Procedure: IR WITH ANESTHESIA;  Surgeon: Radiologist, Medication, MD;  Location: [facility] OR;  Service: Radiology;  Laterality: N/A;   TONSILLECTOMY      Allergies  Allergen Reactions   Amoxicillin Other (See Comments)    UTI Has patient had a PCN reaction causing immediate rash, facial/tongue/throat swelling, SOB or lightheadedness with hypotension: No Has patient had a PCN reaction causing severe rash involving mucus membranes or skin necrosis: No Has patient had a PCN reaction that required hospitalization: No Has patient had a PCN reaction occurring within the last 10 years: Yes--UTI ONLY If all of the above answers are "NO", then may  proceed with Cephalosporin use.    Atenolol Cough   Crestor [Rosuvastatin Calcium] Other (See Comments)    Muscle aches   Pravastatin Other (See Comments)    Muscle aches   Rosuvastatin Calcium    Sulfa Antibiotics Nausea Only   Codeine Other (See Comments)    hallucinations    Outpatient Encounter Medications as of 06/10/2023  Medication Sig   acetaminophen (TYLENOL) 325 MG tablet Take 650 mg by mouth every 6 (six) hours as needed for mild pain or fever.   albuterol (VENTOLIN HFA) 108 (90 Base) MCG/ACT inhaler Inhale 2 puffs into the lungs every 6 (six) hours as needed for wheezing or shortness of breath.   amiodarone (PACERONE) 200 MG tablet Take 100 mg by mouth daily. May give crushed   amLODipine (NORVASC) 5 MG tablet Take 2.5 mg by mouth daily.   apixaban (ELIQUIS) 5 MG TABS tablet Take 5 mg by mouth 2 (two) times daily. May give crushed   bisacodyl (DULCOLAX) 5 MG EC tablet Take 5 mg by mouth 2 (two) times daily as needed for moderate constipation.   buPROPion (  WELLBUTRIN XL) 150 MG 24 hr tablet Take 150 mg by mouth daily.   cetirizine (ZYRTEC) 10 MG tablet Take 1 tablet (10 mg total) by mouth daily for 14 days.   diclofenac Sodium (VOLTAREN) 1 % GEL Apply one application to right hip and knee four times daily as needed.   Emollient (CERAVE) CREA Apply 1 Application topically as needed.   ezetimibe (ZETIA) 10 MG tablet Take 10 mg by mouth daily. Oral if crushed   fluticasone (FLONASE) 50 MCG/ACT nasal spray Place 2 sprays into both nostrils daily for 14 days.   furosemide (LASIX) 20 MG tablet Take 20 mg by mouth daily as needed. Give Lasix 20mg  for weight gain equal or greater than 3 pounds   furosemide (LASIX) 20 MG tablet Take 1 tablet (20 mg total) by mouth daily for 3 days.   hydrALAZINE (APRESOLINE) 10 MG tablet Take 10 mg by mouth 3 (three) times daily as needed (SBP > 165 or DBP > 95).   hydrocortisone cream 1 % Apply 1 Application topically 2 (two) times daily as needed for  itching.   ipratropium-albuterol (DUONEB) 0.5-2.5 (3) MG/3ML SOLN Inhale 3 mLs into the lungs every 4 (four) hours as needed.   ketoconazole (NIZORAL) 2 % cream Apply 1 application  topically daily as needed for irritation.   menthol-cetylpyridinium (CEPACOL) 3 MG lozenge Take 1 lozenge (3 mg total) by mouth every 4 (four) hours as needed for sore throat.   nystatin cream (MYCOSTATIN) Apply 1 application topically 2 (two) times daily as needed for dry skin (apply to perirectal rash).   ondansetron (ZOFRAN) 4 MG tablet Take 4 mg by mouth every 6 (six) hours as needed for nausea or vomiting.   OXYGEN Inhale 3 L into the lungs as directed. to maintain sats > or equal to 90%. May titrate Three Times A Day; 07:00 AM - 03:00 PM, 03:00 PM - 11:00 PM, 11:00 PM - 7:00 am   pantoprazole (PROTONIX) 40 MG tablet Take 40 mg by mouth 2 (two) times daily.   Polyethyl Glycol-Propyl Glycol (SYSTANE) 0.4-0.3 % SOLN Apply 2 drops to eye every 6 (six) hours as needed (dry eyes).   polyethylene glycol powder (GLYCOLAX/MIRALAX) 17 GM/SCOOP powder Take 17 g by mouth every other day.   Potassium Chloride ER 20 MEQ TBCR Take 20 mEq by mouth daily.   potassium chloride SA (KLOR-CON M) 20 MEQ tablet Take 0.5 tablets (10 mEq total) by mouth daily for 3 days.   predniSONE (DELTASONE) 20 MG tablet Take 1 tablet (20 mg total) by mouth daily with breakfast for 5 days.   sennosides-docusate sodium (SENOKOT-S) 8.6-50 MG tablet Take 2 tablets by mouth 2 (two) times daily.   Tiotropium Bromide-Olodaterol (STIOLTO RESPIMAT) 2.5-2.5 MCG/ACT AERS Inhale 2 puffs into the lungs daily.   zinc oxide 20 % ointment Apply 1 Application topically 2 (two) times daily as needed for irritation. apply to peri rectal area BID PRN for rash-use with Nystatin   Facility-Administered Encounter Medications as of 06/10/2023  Medication   botulinum toxin Type A (BOTOX) injection 100 Units    Review of Systems  Constitutional:  Negative for activity  change and fever.  HENT:  Negative for sore throat and trouble swallowing.   Respiratory:  Positive for cough and shortness of breath. Negative for wheezing.   Cardiovascular:  Negative for chest pain and leg swelling.  Gastrointestinal:  Negative for abdominal distention and abdominal pain.  Genitourinary:  Negative for dysuria and hematuria.  Musculoskeletal:  Positive for gait problem.  Skin:  Negative for wound.  Neurological:  Positive for weakness. Negative for dizziness and headaches.  Psychiatric/Behavioral:  Negative for confusion and dysphoric mood. The patient is not nervous/anxious.     Immunization History  Administered Date(s) Administered   Fluad Quad(high Dose 65+) 02/05/2022, 01/08/2023   Influenza Split 01/16/2009, 04/30/2010, 01/26/2016, 02/07/2018, 02/01/2020   Influenza, High Dose Seasonal PF 02/07/2018, 01/25/2019, 02/28/2021, 02/05/2022   Influenza,inj,Quad PF,6+ Mos 03/06/2012, 01/26/2016, 01/10/2017   Influenza,inj,quad, With Preservative 02/14/2015   Moderna Covid-19 Vaccine Bivalent Booster 61yrs & up 02/28/2021, 02/04/2023   PFIZER(Purple Top)SARS-COV-2 Vaccination 05/06/2019, 05/27/2019, 01/27/2020, 09/18/2020   Pneumococcal Conjugate-13 03/19/2017   Pneumococcal Polysaccharide-23 02/14/2015   Respiratory Syncytial Virus Vaccine,Recomb Aduvanted(Arexvy) 02/15/2022   Td 02/25/2005   Tdap 05/19/2013   Zoster, Live 07/28/2006   Pertinent  Health Maintenance Due  Topic Date Due   INFLUENZA VACCINE  Completed   DEXA SCAN  Discontinued      05/07/2022   11:59 AM 08/13/2022   11:02 AM 11/26/2022   12:34 PM 12/31/2022    3:26 PM 03/11/2023    2:48 PM  Fall Risk  Falls in the past year? 0 1 0 0 1  Was there an injury with Fall? 0 0 0 0 0  Fall Risk Category Calculator 0 1 0 0 1  Patient at Risk for Falls Due to No Fall Risks History of fall(s);Impaired balance/gait;Impaired mobility History of fall(s);Impaired balance/gait History of fall(s);Impaired  balance/gait;Impaired mobility History of fall(s);Impaired balance/gait;Impaired mobility  Patient at Risk for Falls Due to - Comments  right sided hemiparesis     Fall risk Follow up Falls evaluation completed Falls evaluation completed;Education provided;Falls prevention discussed Falls evaluation completed;Education provided Falls evaluation completed;Education provided;Falls prevention discussed Falls evaluation completed;Education provided;Falls prevention discussed   Functional Status Survey:    Vitals:   06/10/23 1248  BP: 126/74  Pulse: 69  Resp: 17  Temp: (!) 97 F (36.1 C)  SpO2: 92%  Weight: 86 lb 12.8 oz (39.4 kg)  Height: 5\' 3"  (1.6 m)   Body mass index is 15.38 kg/m. Physical Exam Vitals reviewed.  Constitutional:      Appearance: She is underweight.  HENT:     Head: Normocephalic.  Eyes:     General:        Right eye: No discharge.        Left eye: No discharge.  Cardiovascular:     Rate and Rhythm: Normal rate and regular rhythm.     Pulses: Normal pulses.     Heart sounds: Normal heart sounds.  Pulmonary:     Effort: Pulmonary effort is normal. No respiratory distress.     Breath sounds: Examination of the right-middle field reveals decreased breath sounds. Examination of the right-lower field reveals decreased breath sounds. Decreased breath sounds present. No wheezing or rales.  Abdominal:     General: Bowel sounds are normal.     Palpations: Abdomen is soft.  Musculoskeletal:     Cervical back: Neck supple.     Right lower leg: No edema.     Left lower leg: No edema.  Skin:    General: Skin is warm.     Capillary Refill: Capillary refill takes less than 2 seconds.  Neurological:     General: No focal deficit present.     Mental Status: She is oriented to person, place, and time.     Motor: Weakness present.     Gait: Gait abnormal.  Comments: Right sided weakness  Psychiatric:        Mood and Affect: Mood normal.     Labs  reviewed: Recent Labs    03/18/23 0000 05/21/23 0000  NA 140 142  K 4.4 4.2  CL 106 107  CO2 22 25*  BUN 18 24*  CREATININE 0.8 1.0  CALCIUM 10.0 8.9   Recent Labs    03/18/23 0000 05/21/23 0000  AST 27 30  ALT 23 35  ALKPHOS 96 93  ALBUMIN 4.2 3.4*   Recent Labs    09/19/22 0000 03/18/23 0000 05/21/23 0000  WBC 7.4 4.0 7.2  HGB 14.0 12.5 13.6  HCT 43 38 41  PLT 246 180 246   Lab Results  Component Value Date   TSH 0.61 08/08/2022   Lab Results  Component Value Date   HGBA1C 6.0 (H) 11/13/2020   Lab Results  Component Value Date   CHOL 164 03/18/2023   HDL 61 03/18/2023   LDLCALC 67 03/18/2023   TRIG 179 (A) 03/18/2023   CHOLHDL 3.0 11/13/2020    Significant Diagnostic Results in last 30 days:  No results found.  Assessment/Plan 1. Pneumonia of right lower lobe due to infectious organism (Primary) - ongoing cough since 02/11 - 02/24 CXR indicated right lower lob PNA - completed doxycycline x 7 days - due to allergies unable to prescribe Augmentin - will start Levaquin 750 mg po once> give 02/25, then 500 mg x 4 days - levofloxacin (LEVAQUIN) 750 MG tablet; Take 1 tablet (750 mg total) by mouth once for 1 dose. - levofloxacin (LEVAQUIN) 500 MG tablet; Take 1 tablet (500 mg total) by mouth daily for 4 days.  2. Moderate protein malnutrition (HCC) - BMI 15.38 - suspect weight loss associated with recent PNA - cont monthly weights  3. Elevated liver enzymes - AST/ALT 85/181 06/08/2023 - not on statin - no RUQ tenderness - repeat hepatic panel in 2 weeks    Family/ staff Communication: plan discussed with patient and nurse  Labs/tests ordered:  hepatic panel in 2 weeks

## 2023-06-17 ENCOUNTER — Encounter: Payer: Self-pay | Admitting: Orthopedic Surgery

## 2023-06-17 ENCOUNTER — Non-Acute Institutional Stay (SKILLED_NURSING_FACILITY): Payer: Self-pay | Admitting: Orthopedic Surgery

## 2023-06-17 DIAGNOSIS — J189 Pneumonia, unspecified organism: Secondary | ICD-10-CM | POA: Diagnosis not present

## 2023-06-17 MED ORDER — CEFPODOXIME PROXETIL 200 MG PO TABS: 200.0000 mg | ORAL_TABLET | Freq: Two times a day (BID) | ORAL | Status: AC

## 2023-06-17 MED ORDER — GUAIFENESIN ER 600 MG PO TB12: 600.0000 mg | ORAL_TABLET | Freq: Two times a day (BID) | ORAL | Status: AC

## 2023-06-17 NOTE — Progress Notes (Signed)
 Location:  [facility] Room Number: 116A Place of Service:  SNF [phone]) Provider:  Priscella Mann, MD  Patient Care Team: [name], MD as PCP - General (Internal Medicine) [name], MD as PCP - Cardiology (Cardiology) Duke Salvia, MD as PCP - Electrophysiology (Cardiology) Shirlean Kelly, MD as Consulting Physician (Neurosurgery)  Extended Emergency Contact Information Primary Emergency Contact: Catterton,[name] Address: [address]          [place] [id number] [name] Home Phone: [phone] Mobile Phone: [phone] Relation: Spouse Secondary Emergency Contact: Pavlovic,[name] Address: [address]          [place], [place] [address] [name] Mobile Phone: [phone] Relation: Son  Code Status:  Full Goals of care: Advanced Directive information    06/17/2023   10:31 AM  Advanced Directives  Does Patient Have a Medical Advance Directive? Yes  Type of Estate agent of [place];Living will  Does patient want to make changes to medical advance directive? No - Patient declined  Copy of Healthcare Power of Attorney in Chart? Yes - validated most recent copy scanned in chart (See row information)     Chief Complaint  Patient presents with   Acute Visit    Chest congestion    HPI:  Pt is a 78 y.o. female seen today for an acute visit due to ongoing cough.   She currently resides on the skilled nursing unit at [facility]. 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    "02/11 c/o worsening non productive cough. Covid/flu negative. She was started on doxycycline and prednisone taper due to concerns of PNA/ bronchitis. She was also given furosemide 20 mg x 3 days due to h/o CHF. 02/17 CXR negative for infiltrates and effusion.  02/23 BNP was 412. Weights have trended down. No ankle edema or sob. Remains on 3 liters oxygen. 02/24 Dr. Chales Abrahams ordered repeat CXR due to unresolved symptoms. CXR revealed right lower lobe pneumonia and small effusion. She was started on Levaquin x 5 days." Today, her cough remains productive. Nursing is unable to wean her oxygen. She is currently using 3 liters, baseline 2 liters. She does report feeling some improvement today. Afebrile. Denies chest pain or shortness of breath.    Past Medical History:  Diagnosis Date   Anxiety    Arthritis    "some in my lower back; probably elbows, knees" (11/18/2017)   Atrial fibrillation (HCC)    Bell's palsy    when pt. was 78 yrs old, when under stress the left side of face will droop.   Complication of anesthesia    "vascular OR 2016; BP bottomed out; couldn't get it regulated; ended up in ICU for DAYS" (11/18/2017)   GERD (gastroesophageal reflux disease)    History of kidney stones    Hypertension    Hypertrophic cardiomyopathy (HCC)    severe LV basilar hypertrophy witn no evidence of significant outflow tract obstruction, EF 65-70%, mild LAE, mild TR, grade 1a diastolic dysfunction 05/15/10 (Dr. Donato Schultz) (Atrial Septal Hypertrophy pattern)-- Intra-op TEE with dsignificant outflow tract obstruction - AI, MR & TR   Insomnia    Mild aortic sclerosis    Osteopenia    Peripheral vascular disease (HCC)    Syncope    , Vagal   Past Surgical History:  Procedure  Laterality Date   AUGMENTATION MAMMAPLASTY Bilateral    BACK SURGERY     CARDIAC CATHETERIZATION N/A 05/07/2016   Procedure: Left Heart Cath and Coronary Angiography;  Surgeon: Kathleene Hazel, MD;  Location: [facility] INVASIVE CV LAB;  Service: Cardiovascular;  Laterality: N/A;   CARDIOVERSION N/A 09/24/2017   Procedure: CARDIOVERSION;  Surgeon: Laurey Morale, MD;  Location: [facility] ENDOSCOPY;  Service: Cardiovascular;  Laterality: N/A;   DILATION AND CURETTAGE OF UTERUS     ENDARTERECTOMY  FEMORAL Right 03/02/2015   Procedure: ENDARTERECTOMY RIGHT FEMORAL;  Surgeon: Chuck Hint, MD;  Location: [facility] OR;  Service: Vascular;  Laterality: Right;   ESOPHAGOGASTRODUODENOSCOPY (EGD) WITH PROPOFOL N/A 12/01/2020   Procedure: ESOPHAGOGASTRODUODENOSCOPY (EGD) WITH PROPOFOL;  Surgeon: Diamantina Monks, MD;  Location: [facility] ENDOSCOPY;  Service: General;  Laterality: N/A;   FACIAL COSMETIC SURGERY Left 2002   "related to Bell's Palsy @ age 45; left eye/side of face droopy; tried to make area symmetrical"   FEMORAL-POPLITEAL BYPASS GRAFT Right 03/02/2015   Procedure: BYPASS GRAFT FEMORAL-BELOW KNEE POPLITEAL ARTERY;  Surgeon: Chuck Hint, MD;  Location: [facility] OR;  Service: Vascular;  Laterality: Right;   INGUINAL HERNIA REPAIR Bilateral 2002   IR ANGIO INTRA EXTRACRAN SEL COM CAROTID INNOMINATE UNI L MOD SED  11/16/2020   IR CT HEAD LTD  11/13/2020   IR PERCUTANEOUS ART THROMBECTOMY/INFUSION INTRACRANIAL INC DIAG ANGIO  11/13/2020   OVARIAN CYST REMOVAL Left    PEG PLACEMENT N/A 12/01/2020   Procedure: PERCUTANEOUS ENDOSCOPIC GASTROSTOMY (PEG) PLACEMENT;  Surgeon: Diamantina Monks, MD;  Location: [facility] ENDOSCOPY;  Service: General;  Laterality: N/A;   PERIPHERAL VASCULAR CATHETERIZATION N/A 01/16/2015   Procedure: Abdominal Aortogram;  Surgeon: Chuck Hint, MD;  Location: [facility] INVASIVE CV LAB;  Service: Cardiovascular;  Laterality: N/A;   POSTERIOR LUMBAR FUSION  2015   "have plates and screws in there"   RADIOLOGY WITH ANESTHESIA N/A 11/13/2020   Procedure: IR WITH ANESTHESIA;  Surgeon: Radiologist, Medication, MD;  Location: [facility] OR;  Service: Radiology;  Laterality: N/A;   TONSILLECTOMY      Allergies  Allergen Reactions   Amoxicillin Other (See Comments)    UTI Has patient had a PCN reaction causing immediate rash, facial/tongue/throat swelling, SOB or lightheadedness with hypotension: No Has patient had a PCN reaction causing severe rash involving mucus membranes or skin  necrosis: No Has patient had a PCN reaction that required hospitalization: No Has patient had a PCN reaction occurring within the last 10 years: Yes--UTI ONLY If all of the above answers are "NO", then may proceed with Cephalosporin use.    Atenolol Cough   Crestor [Rosuvastatin Calcium] Other (See Comments)    Muscle aches   Pravastatin Other (See Comments)    Muscle aches   Rosuvastatin Calcium    Sulfa Antibiotics Nausea Only   Codeine Other (See Comments)    hallucinations    Outpatient Encounter Medications as of 06/17/2023  Medication Sig   acetaminophen (TYLENOL) 325 MG tablet Take 650 mg by mouth every 6 (six) hours as needed for mild pain or fever.   albuterol (VENTOLIN HFA) 108 (90 Base) MCG/ACT inhaler Inhale 2 puffs into the lungs every 6 (six) hours as needed for wheezing or shortness of breath.   amiodarone (PACERONE) 200 MG tablet Take 100 mg by mouth daily. May give crushed   amLODipine (NORVASC) 5 MG tablet Take 2.5 mg by mouth daily.   apixaban (ELIQUIS) 5 MG TABS tablet Take 5 mg by  mouth 2 (two) times daily. May give crushed   bisacodyl (DULCOLAX) 5 MG EC tablet Take 5 mg by mouth 2 (two) times daily as needed for moderate constipation.   buPROPion (WELLBUTRIN XL) 150 MG 24 hr tablet Take 150 mg by mouth daily.   cetirizine (ZYRTEC) 10 MG tablet Take 1 tablet (10 mg total) by mouth daily for 14 days.   diclofenac Sodium (VOLTAREN) 1 % GEL Apply one application to right hip and knee four times daily as needed.   Emollient (CERAVE) CREA Apply 1 Application topically as needed.   ezetimibe (ZETIA) 10 MG tablet Take 10 mg by mouth daily. Oral if crushed   fluticasone (FLONASE) 50 MCG/ACT nasal spray Place 2 sprays into both nostrils daily for 14 days.   furosemide (LASIX) 20 MG tablet Take 20 mg by mouth daily as needed. Give Lasix 20mg  for weight gain equal or greater than 3 pounds   hydrALAZINE (APRESOLINE) 10 MG tablet Take 10 mg by mouth 3 (three) times daily as  needed (SBP > 165 or DBP > 95).   hydrocortisone cream 1 % Apply 1 Application topically 2 (two) times daily as needed for itching.   ipratropium-albuterol (DUONEB) 0.5-2.5 (3) MG/3ML SOLN Inhale 3 mLs into the lungs every 4 (four) hours as needed.   ketoconazole (NIZORAL) 2 % cream Apply 1 application  topically daily as needed for irritation.   menthol-cetylpyridinium (CEPACOL) 3 MG lozenge Take 1 lozenge (3 mg total) by mouth every 4 (four) hours as needed for sore throat.   nystatin cream (MYCOSTATIN) Apply 1 application topically 2 (two) times daily as needed for dry skin (apply to perirectal rash).   ondansetron (ZOFRAN) 4 MG tablet Take 4 mg by mouth every 6 (six) hours as needed for nausea or vomiting.   OXYGEN Inhale 3 L into the lungs as directed. to maintain sats > or equal to 90%. May titrate Three Times A Day; 07:00 AM - 03:00 PM, 03:00 PM - 11:00 PM, 11:00 PM - 7:00 am   pantoprazole (PROTONIX) 40 MG tablet Take 40 mg by mouth 2 (two) times daily.   Polyethyl Glycol-Propyl Glycol (SYSTANE) 0.4-0.3 % SOLN Apply 2 drops to eye every 6 (six) hours as needed (dry eyes).   polyethylene glycol powder (GLYCOLAX/MIRALAX) 17 GM/SCOOP powder Take 17 g by mouth every other day.   sennosides-docusate sodium (SENOKOT-S) 8.6-50 MG tablet Take 2 tablets by mouth 2 (two) times daily.   Tiotropium Bromide-Olodaterol (STIOLTO RESPIMAT) 2.5-2.5 MCG/ACT AERS Inhale 2 puffs into the lungs daily.   zinc oxide 20 % ointment Apply 1 Application topically 2 (two) times daily as needed for irritation. apply to peri rectal area BID PRN for rash-use with Nystatin   Facility-Administered Encounter Medications as of 06/17/2023  Medication   botulinum toxin Type A (BOTOX) injection 100 Units    Review of Systems  Constitutional:  Positive for fatigue.  HENT:  Negative for sore throat and trouble swallowing.   Respiratory:  Positive for cough and shortness of breath. Negative for chest tightness and wheezing.    Cardiovascular:  Negative for chest pain and leg swelling.  Gastrointestinal:  Negative for abdominal pain.  Genitourinary:  Negative for dysuria.  Musculoskeletal:  Positive for gait problem.  Skin:  Negative for wound.  Neurological:  Positive for weakness. Negative for dizziness and light-headedness.  Psychiatric/Behavioral:  Negative for decreased concentration. The patient is not nervous/anxious.     Immunization History  Administered Date(s) Administered   Fluad Quad(high Dose  65+) 02/05/2022, 01/08/2023   Influenza Split 01/16/2009, 04/30/2010, 01/26/2016, 02/07/2018, 02/01/2020   Influenza, High Dose Seasonal PF 02/07/2018, 01/25/2019, 02/28/2021, 02/05/2022   Influenza,inj,Quad PF,6+ Mos 03/06/2012, 01/26/2016, 01/10/2017   Influenza,inj,quad, With Preservative 02/14/2015   Moderna Covid-19 Vaccine Bivalent Booster 29yrs & up 02/28/2021, 02/04/2023   PFIZER(Purple Top)SARS-COV-2 Vaccination 05/06/2019, 05/27/2019, 01/27/2020, 09/18/2020   Pneumococcal Conjugate-13 03/19/2017   Pneumococcal Polysaccharide-23 02/14/2015   Respiratory Syncytial Virus Vaccine,Recomb Aduvanted(Arexvy) 02/15/2022   Td 02/25/2005   Tdap 05/19/2013   Zoster, Live 07/28/2006   Pertinent  Health Maintenance Due  Topic Date Due   INFLUENZA VACCINE  Completed   DEXA SCAN  Discontinued      05/07/2022   11:59 AM 08/13/2022   11:02 AM 11/26/2022   12:34 PM 12/31/2022    3:26 PM 03/11/2023    2:48 PM  Fall Risk  Falls in the past year? 0 1 0 0 1  Was there an injury with Fall? 0 0 0 0 0  Fall Risk Category Calculator 0 1 0 0 1  Patient at Risk for Falls Due to No Fall Risks History of fall(s);Impaired balance/gait;Impaired mobility History of fall(s);Impaired balance/gait History of fall(s);Impaired balance/gait;Impaired mobility History of fall(s);Impaired balance/gait;Impaired mobility  Patient at Risk for Falls Due to - Comments  right sided hemiparesis     Fall risk Follow up Falls evaluation  completed Falls evaluation completed;Education provided;Falls prevention discussed Falls evaluation completed;Education provided Falls evaluation completed;Education provided;Falls prevention discussed Falls evaluation completed;Education provided;Falls prevention discussed   Functional Status Survey:    Vitals:   06/17/23 1030  BP: 125/74  Pulse: 70  Resp: 18  Temp: 97.8 F (36.6 C)  SpO2: 100%  Weight: 86 lb 12.8 oz (39.4 kg)  Height: 5\' 3"  (1.6 m)   Body mass index is 15.38 kg/m. Physical Exam  Labs reviewed: Recent Labs    03/18/23 0000 05/21/23 0000  NA 140 142  K 4.4 4.2  CL 106 107  CO2 22 25*  BUN 18 24*  CREATININE 0.8 1.0  CALCIUM 10.0 8.9   Recent Labs    03/18/23 0000 05/21/23 0000  AST 27 30  ALT 23 35  ALKPHOS 96 93  ALBUMIN 4.2 3.4*   Recent Labs    09/19/22 0000 03/18/23 0000 05/21/23 0000  WBC 7.4 4.0 7.2  HGB 14.0 12.5 13.6  HCT 43 38 41  PLT 246 180 246   Lab Results  Component Value Date   TSH 0.61 08/08/2022   Lab Results  Component Value Date   HGBA1C 6.0 (H) 11/13/2020   Lab Results  Component Value Date   CHOL 164 03/18/2023   HDL 61 03/18/2023   LDLCALC 67 03/18/2023   TRIG 179 (A) 03/18/2023   CHOLHDL 3.0 11/13/2020    Significant Diagnostic Results in last 30 days:  No results found.  Assessment/Plan 1. Pneumonia of right lower lobe due to infectious organism (Primary) - 02/24 CXR indicated right lower lobe PNA - completed doxycycline x 7, Levaquin x 5 - similar PNA 09/2021> involved RLL> resolved with cefpodoxime - recommend pulmonary consult> advise PCP if unable to schedule quickly> consider CT chest without contrast at [facility] - start mucinex per [facility] protocol  - cefpodoxime (VANTIN) 200 MG tablet; Take 1 tablet (200 mg total) by mouth 2 (two) times daily for 7 days.    Family/ staff Communication: plan discussed with patient and nurse  Labs/tests ordered:  none

## 2023-06-18 ENCOUNTER — Ambulatory Visit: Admitting: Pulmonary Disease

## 2023-06-18 ENCOUNTER — Ambulatory Visit

## 2023-06-18 ENCOUNTER — Encounter: Payer: Self-pay | Admitting: Pulmonary Disease

## 2023-06-18 VITALS — BP 100/60 | HR 66 | Temp 98.2°F | Ht 64.0 in | Wt 94.0 lb

## 2023-06-18 DIAGNOSIS — J189 Pneumonia, unspecified organism: Secondary | ICD-10-CM

## 2023-06-18 DIAGNOSIS — J9611 Chronic respiratory failure with hypoxia: Secondary | ICD-10-CM

## 2023-06-18 DIAGNOSIS — R053 Chronic cough: Secondary | ICD-10-CM

## 2023-06-18 DIAGNOSIS — J439 Emphysema, unspecified: Secondary | ICD-10-CM | POA: Diagnosis not present

## 2023-06-18 DIAGNOSIS — R059 Cough, unspecified: Secondary | ICD-10-CM | POA: Diagnosis not present

## 2023-06-18 DIAGNOSIS — R918 Other nonspecific abnormal finding of lung field: Secondary | ICD-10-CM | POA: Diagnosis not present

## 2023-06-18 NOTE — Progress Notes (Signed)
 Beverly Li    [id number]    1945-08-20  Primary Care Physician:Gupta, Freddie Breech, MD  Referring Physician: [name], MD [address] [place],  [place] [address]  Chief complaint:   In for an acute visit  HPI:  Accompanied by spouse Lives in an assisted living facility She is wheelchair dependent following a stroke about 3 years ago  Known to have obstructive lung disease Was following with Dr. Delton Coombes During the last visit in September 2024 was doing well  Has had a cough with symptoms of the lower respiratory infection that was treated with multiple courses of antibiotics Did use a course of Levaquin, course of doxycycline and currently on a course of cefpodoxime just started 3 5, to continue until 3/11  Had a chest x-ray about 3 weeks ago, did not have significant abnormalities but an x-ray that was done about a week ago did so show some fluid in the lung  Known to have obstructive lung disease Uses a nebulizer and Stiolto  Has a history of A-fib, HOCM, peripheral arterial disease  Admits to a cough but not really bringing up any secretions, has had the cough for about 6 weeks Cough has no predilection for any particular time of the day, spouse states it is worse in the mornings  Smoked previously quit in 2017  Has chronic respiratory failure for which she is on oxygen supplementation around-the-clock  Outpatient Encounter Medications as of 06/18/2023  Medication Sig   acetaminophen (TYLENOL) 325 MG tablet Take 650 mg by mouth every 6 (six) hours as needed for mild pain or fever.   albuterol (VENTOLIN HFA) 108 (90 Base) MCG/ACT inhaler Inhale 2 puffs into the lungs every 6 (six) hours as needed for wheezing or shortness of breath.   amiodarone (PACERONE) 200 MG tablet Take 100 mg by mouth daily. May give crushed   amLODipine (NORVASC) 5 MG tablet Take 2.5 mg by mouth daily.   apixaban (ELIQUIS) 5 MG TABS tablet Take 5 mg by mouth 2 (two) times daily.  May give crushed   bisacodyl (DULCOLAX) 5 MG EC tablet Take 5 mg by mouth 2 (two) times daily as needed for moderate constipation.   buPROPion (WELLBUTRIN XL) 150 MG 24 hr tablet Take 150 mg by mouth daily.   cefpodoxime (VANTIN) 200 MG tablet Take 1 tablet (200 mg total) by mouth 2 (two) times daily for 7 days.   diclofenac Sodium (VOLTAREN) 1 % GEL Apply one application to right hip and knee four times daily as needed.   Emollient (CERAVE) CREA Apply 1 Application topically as needed.   ezetimibe (ZETIA) 10 MG tablet Take 10 mg by mouth daily. Oral if crushed   furosemide (LASIX) 20 MG tablet Take 20 mg by mouth daily as needed. Give Lasix 20mg  for weight gain equal or greater than 3 pounds   guaiFENesin (MUCINEX) 600 MG 12 hr tablet Take 1 tablet (600 mg total) by mouth 2 (two) times daily for 3 days.   hydrALAZINE (APRESOLINE) 10 MG tablet Take 10 mg by mouth 3 (three) times daily as needed (SBP > 165 or DBP > 95).   hydrocortisone cream 1 % Apply 1 Application topically 2 (two) times daily as needed for itching.   ipratropium-albuterol (DUONEB) 0.5-2.5 (3) MG/3ML SOLN Inhale 3 mLs into the lungs every 4 (four) hours as needed.   ketoconazole (NIZORAL) 2 % cream Apply 1 application  topically daily as needed for  irritation.   menthol-cetylpyridinium (CEPACOL) 3 MG lozenge Take 1 lozenge (3 mg total) by mouth every 4 (four) hours as needed for sore throat.   nystatin cream (MYCOSTATIN) Apply 1 application topically 2 (two) times daily as needed for dry skin (apply to perirectal rash).   ondansetron (ZOFRAN) 4 MG tablet Take 4 mg by mouth every 6 (six) hours as needed for nausea or vomiting.   OXYGEN Inhale 3 L into the lungs as directed. to maintain sats > or equal to 90%. May titrate Three Times A Day; 07:00 AM - 03:00 PM, 03:00 PM - 11:00 PM, 11:00 PM - 7:00 am   pantoprazole (PROTONIX) 40 MG tablet Take 40 mg by mouth 2 (two) times daily.   Polyethyl Glycol-Propyl Glycol (SYSTANE) 0.4-0.3 %  SOLN Apply 2 drops to eye every 6 (six) hours as needed (dry eyes).   polyethylene glycol powder (GLYCOLAX/MIRALAX) 17 GM/SCOOP powder Take 17 g by mouth every other day.   sennosides-docusate sodium (SENOKOT-S) 8.6-50 MG tablet Take 2 tablets by mouth 2 (two) times daily.   Tiotropium Bromide-Olodaterol (STIOLTO RESPIMAT) 2.5-2.5 MCG/ACT AERS Inhale 2 puffs into the lungs daily.   zinc oxide 20 % ointment Apply 1 Application topically 2 (two) times daily as needed for irritation. apply to peri rectal area BID PRN for rash-use with Nystatin   cetirizine (ZYRTEC) 10 MG tablet Take 1 tablet (10 mg total) by mouth daily for 14 days.   fluticasone (FLONASE) 50 MCG/ACT nasal spray Place 2 sprays into both nostrils daily for 14 days.   Facility-Administered Encounter Medications as of 06/18/2023  Medication   botulinum toxin Type A (BOTOX) injection 100 Units    Allergies as of 06/18/2023 - Review Complete 06/18/2023  Allergen Reaction Noted   Amoxicillin Other (See Comments) 02/24/2015   Atenolol Cough 05/04/2013   Crestor [rosuvastatin calcium] Other (See Comments) 09/28/2015   Pravastatin Other (See Comments) 09/28/2015   Rosuvastatin calcium  01/21/2022   Sulfa antibiotics Nausea Only 09/19/2012   Codeine Other (See Comments) 09/19/2012    Past Medical History:  Diagnosis Date   Anxiety    Arthritis    "some in my lower back; probably elbows, knees" (11/18/2017)   Atrial fibrillation (HCC)    Bell's palsy    when pt. was 78 yrs old, when under stress the left side of face will droop.   Complication of anesthesia    "vascular OR 2016; BP bottomed out; couldn't get it regulated; ended up in ICU for DAYS" (11/18/2017)   GERD (gastroesophageal reflux disease)    History of kidney stones    Hypertension    Hypertrophic cardiomyopathy (HCC)    severe LV basilar hypertrophy witn no evidence of significant outflow tract obstruction, EF 65-70%, mild LAE, mild TR, grade 1a diastolic dysfunction  05/15/10 (Dr. Donato Schultz) (Atrial Septal Hypertrophy pattern)-- Intra-op TEE with dsignificant outflow tract obstruction - AI, MR & TR   Insomnia    Mild aortic sclerosis    Osteopenia    Peripheral vascular disease (HCC)    Syncope    , Vagal    Past Surgical History:  Procedure Laterality Date   AUGMENTATION MAMMAPLASTY Bilateral    BACK SURGERY     CARDIAC CATHETERIZATION N/A 05/07/2016   Procedure: Left Heart Cath and Coronary Angiography;  Surgeon: Kathleene Hazel, MD;  Location: [facility] INVASIVE CV LAB;  Service: Cardiovascular;  Laterality: N/A;   CARDIOVERSION N/A 09/24/2017   Procedure: CARDIOVERSION;  Surgeon: Laurey Morale, MD;  Location:  [facility] ENDOSCOPY;  Service: Cardiovascular;  Laterality: N/A;   DILATION AND CURETTAGE OF UTERUS     ENDARTERECTOMY FEMORAL Right 03/02/2015   Procedure: ENDARTERECTOMY RIGHT FEMORAL;  Surgeon: Chuck Hint, MD;  Location: [facility] OR;  Service: Vascular;  Laterality: Right;   ESOPHAGOGASTRODUODENOSCOPY (EGD) WITH PROPOFOL N/A 12/01/2020   Procedure: ESOPHAGOGASTRODUODENOSCOPY (EGD) WITH PROPOFOL;  Surgeon: Diamantina Monks, MD;  Location: [facility] ENDOSCOPY;  Service: General;  Laterality: N/A;   FACIAL COSMETIC SURGERY Left 2002   "related to Bell's Palsy @ age 25; left eye/side of face droopy; tried to make area symmetrical"   FEMORAL-POPLITEAL BYPASS GRAFT Right 03/02/2015   Procedure: BYPASS GRAFT FEMORAL-BELOW KNEE POPLITEAL ARTERY;  Surgeon: Chuck Hint, MD;  Location: [facility] OR;  Service: Vascular;  Laterality: Right;   INGUINAL HERNIA REPAIR Bilateral 2002   IR ANGIO INTRA EXTRACRAN SEL COM CAROTID INNOMINATE UNI L MOD SED  11/16/2020   IR CT HEAD LTD  11/13/2020   IR PERCUTANEOUS ART THROMBECTOMY/INFUSION INTRACRANIAL INC DIAG ANGIO  11/13/2020   OVARIAN CYST REMOVAL Left    PEG PLACEMENT N/A 12/01/2020   Procedure: PERCUTANEOUS ENDOSCOPIC GASTROSTOMY (PEG) PLACEMENT;  Surgeon: Diamantina Monks, MD;  Location: [facility] ENDOSCOPY;  Service:  General;  Laterality: N/A;   PERIPHERAL VASCULAR CATHETERIZATION N/A 01/16/2015   Procedure: Abdominal Aortogram;  Surgeon: Chuck Hint, MD;  Location: [facility] INVASIVE CV LAB;  Service: Cardiovascular;  Laterality: N/A;   POSTERIOR LUMBAR FUSION  2015   "have plates and screws in there"   RADIOLOGY WITH ANESTHESIA N/A 11/13/2020   Procedure: IR WITH ANESTHESIA;  Surgeon: Radiologist, Medication, MD;  Location: [facility] OR;  Service: Radiology;  Laterality: N/A;   TONSILLECTOMY      Family History  Problem Relation Age of Onset   Liver cancer Mother    Cancer Mother        Liver   Hypertension Mother    Lung cancer Father    Cancer Father        Lung   Breast cancer Sister    Cancer Sister        Breast    Social History   Socioeconomic History   Marital status: Married    Spouse name: [name]   Number of children: Not on file   Years of education: Not on file   Highest education level: Not on file  Occupational History   Not on file  Tobacco Use   Smoking status: Former    Current packs/day: 0.00    Average packs/day: 1 pack/day for 50.0 years (50.0 ttl pk-yrs)    Types: Cigarettes    Start date: 12/14/1964    Quit date: 12/15/2014    Years since quitting: 8.5   Smokeless tobacco: Never  Vaping Use   Vaping status: Never Used  Substance and Sexual Activity   Alcohol use: Yes    Comment: 11/18/2017 "might have a couple glasses of wine/month; if that"   Drug use: Not on file   Sexual activity: Not Currently  Other Topics Concern   Not on file  Social History Narrative   11/21/21 living at [facility]   Social Drivers of Health   Financial Resource Strain: Low Risk  (08/13/2022)   Overall Financial Resource Strain (CARDIA)    Difficulty of Paying Living Expenses: Not hard at all  Food Insecurity: No Food Insecurity (08/13/2022)   Hunger Vital Sign    Worried About Running Out of Food in the Last Year: Never true  Ran Out of Food in the Last Year: Never true   Transportation Needs: No Transportation Needs (08/13/2022)   PRAPARE - Administrator, Civil Service (Medical): No    Lack of Transportation (Non-Medical): No  Physical Activity: Insufficiently Active (08/13/2022)   Exercise Vital Sign    Days of Exercise per Week: 3 days    Minutes of Exercise per Session: 30 min  Stress: No Stress Concern Present (08/13/2022)   [employer] of Occupational Health - Occupational Stress Questionnaire    Feeling of Stress : Only a little  Social Connections: Moderately Isolated (08/13/2022)   Social Connection and Isolation Panel [NHANES]    Frequency of Communication with Friends and Family: Patient unable to answer    Frequency of Social Gatherings with Friends and Family: More than three times a week    Attends Religious Services: Never    Database administrator or Organizations: No    Attends Banker Meetings: Never    Marital Status: Married  Catering manager Violence: Not At Risk (08/13/2022)   Humiliation, Afraid, Rape, and Kick questionnaire    Fear of Current or Ex-Partner: No    Emotionally Abused: No    Physically Abused: No    Sexually Abused: No    Review of Systems  Respiratory:  Positive for cough and shortness of breath.     Vitals:   06/18/23 1517  BP: 100/60  Pulse: 66  Temp: 98.2 F (36.8 C)  SpO2: 95%     Physical Exam Constitutional:      Appearance: Normal appearance.  HENT:     Head: Normocephalic.     Mouth/Throat:     Mouth: Mucous membranes are moist.  Eyes:     General: No scleral icterus. Cardiovascular:     Rate and Rhythm: Normal rate and regular rhythm.     Heart sounds: No murmur heard.    No friction rub.  Pulmonary:     Effort: No respiratory distress.     Breath sounds: No stridor. No wheezing, rhonchi or rales.  Musculoskeletal:     Cervical back: No rigidity or tenderness.  Neurological:     Mental Status: She is alert.  Psychiatric:        Mood and Affect:  Mood normal.      Data Reviewed: Chest x-ray performed today reviewed by myself shows a right lower lobe atelectasis  Dr. Kavin Leech records reviewed  Records from facility that was brought in by spouse was also reviewed  Assessment:  Pneumonia  Subacute cough  History of chronic obstructive pulmonary disease -On Stiolto and nebulization treatments  Chronic respiratory failure -Will continue oxygen supplementation   Plan/Recommendations: Will continue antibiotics and complete course  Will order for CT scan of the chest to further characterize infiltrate  Prescription for Tessalon Perles to help with cough management  Flutter device use to help with secretion clearance  Continue current inhalers  Follow-up in about 4 to 6 weeks  Encouraged to call with significant concerns    Virl Diamond MD [facility] 06/18/2023, 3:52 PM  CC: [name], MD

## 2023-06-18 NOTE — Patient Instructions (Addendum)
 We will get a chest x-ray today  Order for CT scan of the chest without contrast  Complete current course of antibiotics -No clear indication to change as you have used 2 other courses of antibiotics recently  Continue Stiolto Continue nebulization treatment  Order for [employer] to be used 200 mg p.o. 3 times daily as needed for cough  Use of flutter device at least 2 sessions a day  Follow-up in about 4 to 6 weeks  Call us with significant concerns

## 2023-06-23 DIAGNOSIS — M62552 Muscle wasting and atrophy, not elsewhere classified, left thigh: Secondary | ICD-10-CM | POA: Diagnosis not present

## 2023-06-23 DIAGNOSIS — J208 Acute bronchitis due to other specified organisms: Secondary | ICD-10-CM | POA: Diagnosis not present

## 2023-06-23 DIAGNOSIS — M24572 Contracture, left ankle: Secondary | ICD-10-CM | POA: Diagnosis not present

## 2023-06-23 DIAGNOSIS — M62551 Muscle wasting and atrophy, not elsewhere classified, right thigh: Secondary | ICD-10-CM | POA: Diagnosis not present

## 2023-06-23 DIAGNOSIS — G8111 Spastic hemiplegia affecting right dominant side: Secondary | ICD-10-CM | POA: Diagnosis not present

## 2023-06-23 DIAGNOSIS — R2689 Other abnormalities of gait and mobility: Secondary | ICD-10-CM | POA: Diagnosis not present

## 2023-06-24 ENCOUNTER — Ambulatory Visit
Admission: RE | Admit: 2023-06-24 | Discharge: 2023-06-24 | Disposition: A | Discharge status: Home / Self Care | Source: Ambulatory Visit | Attending: Pulmonary Disease | Admitting: Pulmonary Disease

## 2023-06-24 DIAGNOSIS — R053 Chronic cough: Secondary | ICD-10-CM

## 2023-06-24 DIAGNOSIS — J439 Emphysema, unspecified: Secondary | ICD-10-CM | POA: Diagnosis not present

## 2023-06-24 DIAGNOSIS — I7121 Aneurysm of the ascending aorta, without rupture: Secondary | ICD-10-CM | POA: Diagnosis not present

## 2023-06-24 DIAGNOSIS — Z79899 Other long term (current) drug therapy: Secondary | ICD-10-CM | POA: Diagnosis not present

## 2023-06-24 DIAGNOSIS — I2510 Atherosclerotic heart disease of native coronary artery without angina pectoris: Secondary | ICD-10-CM | POA: Diagnosis not present

## 2023-06-24 LAB — HEPATIC FUNCTION PANEL
ALT: 30 U/L (ref 7–35)
AST: 30 (ref 13–35)
Alkaline Phosphatase: 95 (ref 25–125)
Bilirubin, Direct: 0.2
Bilirubin, Total: 0.2

## 2023-06-24 LAB — COMPREHENSIVE METABOLIC PANEL WITH GFR: Albumin: 3 — AB (ref 3.5–5.0)

## 2023-06-25 DIAGNOSIS — I69328 Other speech and language deficits following cerebral infarction: Secondary | ICD-10-CM | POA: Diagnosis not present

## 2023-06-25 DIAGNOSIS — I69322 Dysarthria following cerebral infarction: Secondary | ICD-10-CM | POA: Diagnosis not present

## 2023-06-25 DIAGNOSIS — R482 Apraxia: Secondary | ICD-10-CM | POA: Diagnosis not present

## 2023-06-26 DIAGNOSIS — R2689 Other abnormalities of gait and mobility: Secondary | ICD-10-CM | POA: Diagnosis not present

## 2023-06-26 DIAGNOSIS — M62551 Muscle wasting and atrophy, not elsewhere classified, right thigh: Secondary | ICD-10-CM | POA: Diagnosis not present

## 2023-06-26 DIAGNOSIS — G8111 Spastic hemiplegia affecting right dominant side: Secondary | ICD-10-CM | POA: Diagnosis not present

## 2023-06-26 DIAGNOSIS — M62552 Muscle wasting and atrophy, not elsewhere classified, left thigh: Secondary | ICD-10-CM | POA: Diagnosis not present

## 2023-06-26 DIAGNOSIS — J208 Acute bronchitis due to other specified organisms: Secondary | ICD-10-CM | POA: Diagnosis not present

## 2023-06-26 DIAGNOSIS — M24572 Contracture, left ankle: Secondary | ICD-10-CM | POA: Diagnosis not present

## 2023-06-27 DIAGNOSIS — M62551 Muscle wasting and atrophy, not elsewhere classified, right thigh: Secondary | ICD-10-CM | POA: Diagnosis not present

## 2023-06-27 DIAGNOSIS — J208 Acute bronchitis due to other specified organisms: Secondary | ICD-10-CM | POA: Diagnosis not present

## 2023-06-27 DIAGNOSIS — R2689 Other abnormalities of gait and mobility: Secondary | ICD-10-CM | POA: Diagnosis not present

## 2023-06-27 DIAGNOSIS — M24572 Contracture, left ankle: Secondary | ICD-10-CM | POA: Diagnosis not present

## 2023-06-27 DIAGNOSIS — M62552 Muscle wasting and atrophy, not elsewhere classified, left thigh: Secondary | ICD-10-CM | POA: Diagnosis not present

## 2023-06-30 ENCOUNTER — Telehealth: Payer: Self-pay | Admitting: Pulmonary Disease

## 2023-06-30 NOTE — Telephone Encounter (Signed)
 Beverly Li states any recommendations for CT scan. Beverly Li phone number is [phone].

## 2023-07-01 DIAGNOSIS — R2689 Other abnormalities of gait and mobility: Secondary | ICD-10-CM | POA: Diagnosis not present

## 2023-07-01 DIAGNOSIS — I69322 Dysarthria following cerebral infarction: Secondary | ICD-10-CM | POA: Diagnosis not present

## 2023-07-01 DIAGNOSIS — R482 Apraxia: Secondary | ICD-10-CM | POA: Diagnosis not present

## 2023-07-01 DIAGNOSIS — J208 Acute bronchitis due to other specified organisms: Secondary | ICD-10-CM | POA: Diagnosis not present

## 2023-07-01 DIAGNOSIS — I69328 Other speech and language deficits following cerebral infarction: Secondary | ICD-10-CM | POA: Diagnosis not present

## 2023-07-01 DIAGNOSIS — M24572 Contracture, left ankle: Secondary | ICD-10-CM | POA: Diagnosis not present

## 2023-07-01 DIAGNOSIS — M62551 Muscle wasting and atrophy, not elsewhere classified, right thigh: Secondary | ICD-10-CM | POA: Diagnosis not present

## 2023-07-01 DIAGNOSIS — M62552 Muscle wasting and atrophy, not elsewhere classified, left thigh: Secondary | ICD-10-CM | POA: Diagnosis not present

## 2023-07-01 NOTE — Telephone Encounter (Signed)
 Spoke with Sallye Ober at [facility] [phone]. Sallye Ober wanted to follow up on the CT scan that was done 06/24/2023 and any recommendations and if the family members have been contacted.   Dr.Olalere can you please advise . Thank you

## 2023-07-02 DIAGNOSIS — M62552 Muscle wasting and atrophy, not elsewhere classified, left thigh: Secondary | ICD-10-CM | POA: Diagnosis not present

## 2023-07-02 DIAGNOSIS — M24572 Contracture, left ankle: Secondary | ICD-10-CM | POA: Diagnosis not present

## 2023-07-02 DIAGNOSIS — R2689 Other abnormalities of gait and mobility: Secondary | ICD-10-CM | POA: Diagnosis not present

## 2023-07-02 DIAGNOSIS — J208 Acute bronchitis due to other specified organisms: Secondary | ICD-10-CM | POA: Diagnosis not present

## 2023-07-02 DIAGNOSIS — M62551 Muscle wasting and atrophy, not elsewhere classified, right thigh: Secondary | ICD-10-CM | POA: Diagnosis not present

## 2023-07-03 ENCOUNTER — Other Ambulatory Visit: Payer: Self-pay | Admitting: Pulmonary Disease

## 2023-07-03 DIAGNOSIS — R9389 Abnormal findings on diagnostic imaging of other specified body structures: Secondary | ICD-10-CM

## 2023-07-03 DIAGNOSIS — J189 Pneumonia, unspecified organism: Secondary | ICD-10-CM

## 2023-07-03 DIAGNOSIS — R482 Apraxia: Secondary | ICD-10-CM | POA: Diagnosis not present

## 2023-07-03 DIAGNOSIS — I69322 Dysarthria following cerebral infarction: Secondary | ICD-10-CM | POA: Diagnosis not present

## 2023-07-03 DIAGNOSIS — I69328 Other speech and language deficits following cerebral infarction: Secondary | ICD-10-CM | POA: Diagnosis not present

## 2023-07-03 NOTE — Telephone Encounter (Signed)
 Spoke with Sallye Ober at [facility] well Left a message for patient's spouse  Continue lines of care  Repeat CT scan in about 3 months to follow infiltrate to resolution

## 2023-07-04 ENCOUNTER — Encounter: Payer: Self-pay | Admitting: Adult Health

## 2023-07-04 ENCOUNTER — Telehealth: Payer: Self-pay | Admitting: Pulmonary Disease

## 2023-07-04 ENCOUNTER — Non-Acute Institutional Stay (SKILLED_NURSING_FACILITY): Payer: Self-pay | Admitting: Adult Health

## 2023-07-04 DIAGNOSIS — H0100A Unspecified blepharitis right eye, upper and lower eyelids: Secondary | ICD-10-CM | POA: Diagnosis not present

## 2023-07-04 DIAGNOSIS — J181 Lobar pneumonia, unspecified organism: Secondary | ICD-10-CM

## 2023-07-04 MED ORDER — OCUSOFT EYELID CLEANSING EX PADS: 1.0000 | MEDICATED_PAD | Freq: Every day | CUTANEOUS | Status: AC

## 2023-07-04 NOTE — Progress Notes (Addendum)
 Location:  Medical illustrator of Service:  SNF (31) Provider:   Peggye Ley, ANP [facility] [phone]   [name], MD  Patient Care Team: [name], MD as PCP - General (Internal Medicine) [name], MD as PCP - Cardiology (Cardiology) Duke Salvia, MD as PCP - Electrophysiology (Cardiology) Shirlean Kelly, MD as Consulting Physician (Neurosurgery)  Extended Emergency Contact Information Primary Emergency Contact: Gorey,[name] Address: [address]          [place] [phone] [name] Home Phone: [phone] Mobile Phone: [phone] Relation: Spouse Secondary Emergency Contact: Ternes,[name] Address: [address]          [place], [place] [address] [name] Mobile Phone: [phone] Relation: Son  Code Status:  Full Goals of care: Advanced Directive information    06/17/2023   10:31 AM  Advanced Directives  Does Patient Have a Medical Advance Directive? Yes  Type of Estate agent of [place];Living will  Does patient want to make changes to medical advance directive? No - Patient declined  Copy of Healthcare Power of Attorney in Chart? Yes - validated most recent copy scanned in chart (See row information)     Chief Complaint  Patient presents with   Acute Visit    Having some anxiety also discuss CT result    HPI:  I received a request to check on Ms. Wallner due to an episode of anxiety and to go over her CT results. The staff has been waiting to hear from the pulmonary office regarding the results.  PMH: COPD, oxygen dependency, afib, CHF, PAD, CVA, HTN  She had a CT scan on March 11th following an episode of pneumonia and cough, which showed right lower lobe consolidative pneumonia, extensive emphysema, a dilated aorta, enlarged pulmonary arteries due to pulmonary hypertension, and coronary artery calcification. No current symptoms  of fever, aches, or shortness of breath different from her baseline. She has been treated with antibiotics (levaquin) for pneumonia and also a round of prednisone and feels better. She is eating solid food without difficulty and does not feel like she is choking.  She has a history of emphysema and uses a handheld nebulizer with albuterol as needed. She is also on Stiolto as maintenance therapy to prevent exacerbations. She only needs the nebulizer rarely.  She experienced a one-time episode of anxiety this am, which she describes as feeling anxious without a specific trigger. No frequent anxiety episodes and has not been on medication for anxiety before. She is currently on Wellbutrin, which helps with depression, anxiety, and smoking cessation.  She reports occasional irritation in her right eye, which she describes as recurring inflammation or infection. She has not had a recent eye exam, and no saline or other treatments are currently being used for her eye condition.  No fever, aches, shortness of breath different from baseline, or difficulty swallowing. No back pain despite thoracic spine deformities seen on imaging.   Past Medical History:  Diagnosis Date   Anxiety    Arthritis    "some in my lower back; probably elbows, knees" (11/18/2017)   Atrial fibrillation (HCC)    Bell's palsy    when pt. was 78 yrs old, when under stress the left side of face will droop.   Complication of anesthesia    "vascular OR 2016; BP bottomed out; couldn't get it regulated; ended up in ICU for DAYS" (11/18/2017)  GERD (gastroesophageal reflux disease)    History of kidney stones    Hypertension    Hypertrophic cardiomyopathy (HCC)    severe LV basilar hypertrophy witn no evidence of significant outflow tract obstruction, EF 65-70%, mild LAE, mild TR, grade 1a diastolic dysfunction 05/15/10 (Dr. Donato Schultz) (Atrial Septal Hypertrophy pattern)-- Intra-op TEE with dsignificant outflow tract obstruction - AI,  MR & TR   Insomnia    Mild aortic sclerosis    Osteopenia    Peripheral vascular disease (HCC)    Syncope    , Vagal   Past Surgical History:  Procedure Laterality Date   AUGMENTATION MAMMAPLASTY Bilateral    BACK SURGERY     CARDIAC CATHETERIZATION N/A 05/07/2016   Procedure: Left Heart Cath and Coronary Angiography;  Surgeon: Kathleene Hazel, MD;  Location: [facility] INVASIVE CV LAB;  Service: Cardiovascular;  Laterality: N/A;   CARDIOVERSION N/A 09/24/2017   Procedure: CARDIOVERSION;  Surgeon: Laurey Morale, MD;  Location: [facility] ENDOSCOPY;  Service: Cardiovascular;  Laterality: N/A;   DILATION AND CURETTAGE OF UTERUS     ENDARTERECTOMY FEMORAL Right 03/02/2015   Procedure: ENDARTERECTOMY RIGHT FEMORAL;  Surgeon: Chuck Hint, MD;  Location: [facility] OR;  Service: Vascular;  Laterality: Right;   ESOPHAGOGASTRODUODENOSCOPY (EGD) WITH PROPOFOL N/A 12/01/2020   Procedure: ESOPHAGOGASTRODUODENOSCOPY (EGD) WITH PROPOFOL;  Surgeon: Diamantina Monks, MD;  Location: [facility] ENDOSCOPY;  Service: General;  Laterality: N/A;   FACIAL COSMETIC SURGERY Left 2002   "related to Bell's Palsy @ age 74; left eye/side of face droopy; tried to make area symmetrical"   FEMORAL-POPLITEAL BYPASS GRAFT Right 03/02/2015   Procedure: BYPASS GRAFT FEMORAL-BELOW KNEE POPLITEAL ARTERY;  Surgeon: Chuck Hint, MD;  Location: [facility] OR;  Service: Vascular;  Laterality: Right;   INGUINAL HERNIA REPAIR Bilateral 2002   IR ANGIO INTRA EXTRACRAN SEL COM CAROTID INNOMINATE UNI L MOD SED  11/16/2020   IR CT HEAD LTD  11/13/2020   IR PERCUTANEOUS ART THROMBECTOMY/INFUSION INTRACRANIAL INC DIAG ANGIO  11/13/2020   OVARIAN CYST REMOVAL Left    PEG PLACEMENT N/A 12/01/2020   Procedure: PERCUTANEOUS ENDOSCOPIC GASTROSTOMY (PEG) PLACEMENT;  Surgeon: Diamantina Monks, MD;  Location: [facility] ENDOSCOPY;  Service: General;  Laterality: N/A;   PERIPHERAL VASCULAR CATHETERIZATION N/A 01/16/2015   Procedure: Abdominal Aortogram;  Surgeon:  Chuck Hint, MD;  Location: [facility] INVASIVE CV LAB;  Service: Cardiovascular;  Laterality: N/A;   POSTERIOR LUMBAR FUSION  2015   "have plates and screws in there"   RADIOLOGY WITH ANESTHESIA N/A 11/13/2020   Procedure: IR WITH ANESTHESIA;  Surgeon: Radiologist, Medication, MD;  Location: [facility] OR;  Service: Radiology;  Laterality: N/A;   TONSILLECTOMY      Allergies  Allergen Reactions   Amoxicillin Other (See Comments)    UTI Has patient had a PCN reaction causing immediate rash, facial/tongue/throat swelling, SOB or lightheadedness with hypotension: No Has patient had a PCN reaction causing severe rash involving mucus membranes or skin necrosis: No Has patient had a PCN reaction that required hospitalization: No Has patient had a PCN reaction occurring within the last 10 years: Yes--UTI ONLY If all of the above answers are "NO", then may proceed with Cephalosporin use.    Atenolol Cough   Crestor [Rosuvastatin Calcium] Other (See Comments)    Muscle aches   Pravastatin Other (See Comments)    Muscle aches   Rosuvastatin Calcium    Sulfa Antibiotics Nausea Only   Codeine Other (See Comments)    hallucinations  Outpatient Encounter Medications as of 07/04/2023  Medication Sig   acetaminophen (TYLENOL) 325 MG tablet Take 650 mg by mouth every 6 (six) hours as needed for mild pain or fever.   albuterol (VENTOLIN HFA) 108 (90 Base) MCG/ACT inhaler Inhale 2 puffs into the lungs every 6 (six) hours as needed for wheezing or shortness of breath.   amiodarone (PACERONE) 200 MG tablet Take 100 mg by mouth daily. May give crushed   amLODipine (NORVASC) 5 MG tablet Take 2.5 mg by mouth daily.   apixaban (ELIQUIS) 5 MG TABS tablet Take 5 mg by mouth 2 (two) times daily. May give crushed   bisacodyl (DULCOLAX) 5 MG EC tablet Take 5 mg by mouth 2 (two) times daily as needed for moderate constipation.   buPROPion (WELLBUTRIN XL) 150 MG 24 hr tablet Take 150 mg by mouth daily.   cetirizine  (ZYRTEC) 10 MG tablet Take 1 tablet (10 mg total) by mouth daily for 14 days.   diclofenac Sodium (VOLTAREN) 1 % GEL Apply one application to right hip and knee four times daily as needed.   Emollient (CERAVE) CREA Apply 1 Application topically as needed.   ezetimibe (ZETIA) 10 MG tablet Take 10 mg by mouth daily. Oral if crushed   fluticasone (FLONASE) 50 MCG/ACT nasal spray Place 2 sprays into both nostrils daily for 14 days.   furosemide (LASIX) 20 MG tablet Take 20 mg by mouth daily as needed. Give Lasix 20mg  for weight gain equal or greater than 3 pounds   hydrALAZINE (APRESOLINE) 10 MG tablet Take 10 mg by mouth 3 (three) times daily as needed (SBP > 165 or DBP > 95).   hydrocortisone cream 1 % Apply 1 Application topically 2 (two) times daily as needed for itching.   ipratropium-albuterol (DUONEB) 0.5-2.5 (3) MG/3ML SOLN Inhale 3 mLs into the lungs every 4 (four) hours as needed.   ketoconazole (NIZORAL) 2 % cream Apply 1 application  topically daily as needed for irritation.   menthol-cetylpyridinium (CEPACOL) 3 MG lozenge Take 1 lozenge (3 mg total) by mouth every 4 (four) hours as needed for sore throat.   nystatin cream (MYCOSTATIN) Apply 1 application topically 2 (two) times daily as needed for dry skin (apply to perirectal rash).   ondansetron (ZOFRAN) 4 MG tablet Take 4 mg by mouth every 6 (six) hours as needed for nausea or vomiting.   OXYGEN Inhale 3 L into the lungs as directed. to maintain sats > or equal to 90%. May titrate Three Times A Day; 07:00 AM - 03:00 PM, 03:00 PM - 11:00 PM, 11:00 PM - 7:00 am   pantoprazole (PROTONIX) 40 MG tablet Take 40 mg by mouth 2 (two) times daily.   Polyethyl Glycol-Propyl Glycol (SYSTANE) 0.4-0.3 % SOLN Apply 2 drops to eye every 6 (six) hours as needed (dry eyes).   polyethylene glycol powder (GLYCOLAX/MIRALAX) 17 GM/SCOOP powder Take 17 g by mouth every other day.   sennosides-docusate sodium (SENOKOT-S) 8.6-50 MG tablet Take 2 tablets by  mouth 2 (two) times daily.   Tiotropium Bromide-Olodaterol (STIOLTO RESPIMAT) 2.5-2.5 MCG/ACT AERS Inhale 2 puffs into the lungs daily.   zinc oxide 20 % ointment Apply 1 Application topically 2 (two) times daily as needed for irritation. apply to peri rectal area BID PRN for rash-use with Nystatin   Facility-Administered Encounter Medications as of 07/04/2023  Medication   botulinum toxin Type A (BOTOX) injection 100 Units    Review of Systems  Constitutional:  Negative for activity change,  appetite change, chills, diaphoresis, fatigue, fever and unexpected weight change.  HENT:  Negative for congestion and trouble swallowing.   Respiratory:  Positive for cough (chronic). Negative for shortness of breath and wheezing.   Cardiovascular:  Negative for chest pain, palpitations and leg swelling.  Gastrointestinal:  Negative for abdominal distention, abdominal pain, constipation and diarrhea.  Genitourinary:  Negative for difficulty urinating and dysuria.  Musculoskeletal:  Positive for arthralgias and gait problem. Negative for back pain, joint swelling and myalgias.  Neurological:  Positive for speech difficulty and weakness. Negative for dizziness, tremors, seizures, syncope, facial asymmetry, light-headedness, numbness and headaches.  Psychiatric/Behavioral:  Negative for agitation, behavioral problems and confusion. The patient is nervous/anxious.     Immunization History  Administered Date(s) Administered   Fluad Quad(high Dose 65+) 02/05/2022, 01/08/2023   Influenza Split 01/16/2009, 04/30/2010, 01/26/2016, 02/07/2018, 02/01/2020   Influenza, High Dose Seasonal PF 02/07/2018, 01/25/2019, 02/28/2021, 02/05/2022   Influenza,inj,Quad PF,6+ Mos 03/06/2012, 01/26/2016, 01/10/2017   Influenza,inj,quad, With Preservative 02/14/2015   Moderna Covid-19 Vaccine Bivalent Booster 54yrs & up 02/28/2021, 02/04/2023   PFIZER(Purple Top)SARS-COV-2 Vaccination 05/06/2019, 05/27/2019, 01/27/2020,  09/18/2020   Pneumococcal Conjugate-13 03/19/2017   Pneumococcal Polysaccharide-23 02/14/2015   Respiratory Syncytial Virus Vaccine,Recomb Aduvanted(Arexvy) 02/15/2022   Td 02/25/2005   Tdap 05/19/2013   Zoster, Live 07/28/2006   Pertinent  Health Maintenance Due  Topic Date Due   INFLUENZA VACCINE  Completed   DEXA SCAN  Discontinued      05/07/2022   11:59 AM 08/13/2022   11:02 AM 11/26/2022   12:34 PM 12/31/2022    3:26 PM 03/11/2023    2:48 PM  Fall Risk  Falls in the past year? 0 1 0 0 1  Was there an injury with Fall? 0 0 0 0 0  Fall Risk Category Calculator 0 1 0 0 1  Patient at Risk for Falls Due to No Fall Risks History of fall(s);Impaired balance/gait;Impaired mobility History of fall(s);Impaired balance/gait History of fall(s);Impaired balance/gait;Impaired mobility History of fall(s);Impaired balance/gait;Impaired mobility  Patient at Risk for Falls Due to - Comments  right sided hemiparesis     Fall risk Follow up Falls evaluation completed Falls evaluation completed;Education provided;Falls prevention discussed Falls evaluation completed;Education provided Falls evaluation completed;Education provided;Falls prevention discussed Falls evaluation completed;Education provided;Falls prevention discussed   Functional Status Survey:    There were no vitals filed for this visit. There is no height or weight on file to calculate BMI. Physical Exam Vitals and nursing note reviewed.  Constitutional:      General: She is not in acute distress.    Appearance: Normal appearance. She is not diaphoretic.  HENT:     Head: Normocephalic and atraumatic.     Nose: No congestion.     Mouth/Throat:     Mouth: Mucous membranes are moist.     Pharynx: Oropharynx is clear. No oropharyngeal exudate or posterior oropharyngeal erythema.  Eyes:     Conjunctiva/sclera: Conjunctivae normal.     Pupils: Pupils are equal, round, and reactive to light.     Comments: Right upper and lower lid  with mild inflammation swelling and erythema. No purulent matter.   Neck:     Thyroid: No thyromegaly.     Vascular: No carotid bruit or JVD.  Cardiovascular:     Rate and Rhythm: Normal rate and regular rhythm.     Heart sounds: Normal heart sounds. No murmur heard. Pulmonary:     Effort: Pulmonary effort is normal. No respiratory distress.  Breath sounds: No stridor. Rales present.     Comments: Rales bases, decreased throughout Abdominal:     General: Bowel sounds are normal. There is no distension.     Palpations: Abdomen is soft.     Tenderness: There is no abdominal tenderness. There is no right CVA tenderness or left CVA tenderness.  Musculoskeletal:        General: Normal range of motion.     Cervical back: No rigidity. No muscular tenderness.     Right lower leg: No edema.     Left lower leg: No edema.  Lymphadenopathy:     Cervical: No cervical adenopathy.  Skin:    General: Skin is warm and dry.  Neurological:     Mental Status: She is alert and oriented to person, place, and time. Mental status is at baseline.  Psychiatric:        Mood and Affect: Mood normal.     Labs reviewed: Recent Labs    03/18/23 0000 05/21/23 0000  NA 140 142  K 4.4 4.2  CL 106 107  CO2 22 25*  BUN 18 24*  CREATININE 0.8 1.0  CALCIUM 10.0 8.9   Recent Labs    03/18/23 0000 05/21/23 0000  AST 27 30  ALT 23 35  ALKPHOS 96 93  ALBUMIN 4.2 3.4*   Recent Labs    09/19/22 0000 03/18/23 0000 05/21/23 0000  WBC 7.4 4.0 7.2  HGB 14.0 12.5 13.6  HCT 43 38 41  PLT 246 180 246   Lab Results  Component Value Date   TSH 0.61 08/08/2022   Lab Results  Component Value Date   HGBA1C 6.0 (H) 11/13/2020   Lab Results  Component Value Date   CHOL 164 03/18/2023   HDL 61 03/18/2023   LDLCALC 67 03/18/2023   TRIG 179 (A) 03/18/2023   CHOLHDL 3.0 11/13/2020    Significant Diagnostic Results in last 30 days:  CT Chest Wo Contrast Result Date: 06/25/2023 CLINICAL DATA:   Chronic cough persistent for over 8 weeks EXAM: CT CHEST WITHOUT CONTRAST TECHNIQUE: Multidetector CT imaging of the chest was performed following the standard protocol without IV contrast. RADIATION DOSE REDUCTION: This exam was performed according to the departmental dose-optimization program which includes automated exposure control, adjustment of the mA and/or kV according to patient size and/or use of iterative reconstruction technique. COMPARISON:  Chest x-ray Aug 18, 2018 fifth CT chest December 05, 2020 FINDINGS: Cardiovascular: Dilatation of the ascending aorta measuring 4.2 x 4.2 cm proximally and 3.8 x 4.5 cm distally with some calcifications of the aortic wall in the aortic arch. The descending thoracic aorta normal measures 2.7 by 2.8 cm proximally and 2.7 x 2.6 cm distally. There is moderate dilatation of the thoracoabdominal aortic junction with significant calcifications of the aorta measuring 3.5 x 3 0.0 cm consistent with a small focal aneurysms. There is large right and left main pulmonary arteries the right measures 3 cm in transverse diameter at its widest section the left 3.4 cm findings could correlate with pulmonary arterial hypertension. Significant coronary artery calcifications No pericardial effusion Mediastinum/Nodes: No enlarged mediastinal or axillary lymph nodes. Thyroid gland, trachea, and esophagus demonstrate no significant findings. Lungs/Pleura: Extensive centrilobular emphysema. Right lower lobe consolidative pneumonia. With some right lower lobe bronchiectasis. No pleural effusions 2 mm nodules in the left upper lobe apicoposterior segment against the fissure consistent with fissural nodule no other significant pulmonary nodules. Upper Abdomen: Calcifications in both kidneys consistent with bilateral nephrolithiasis without  hydronephrosis Musculoskeletal: Calcified bilateral breast implants. Chronic fracture deformities of T11 T9 T5 T3.  No rib fractures. IMPRESSION: *Right lower  lobe consolidative pneumonia. *Extensive centrilobular emphysema. *Dilatation of the ascending aorta measuring 4.2 x 4.2 cm proximally and 3.8 x 4.5 cm distally with some calcifications of the aortic wall in the aortic arch. *There is moderate dilatation of the thoracoabdominal aortic junction with significant calcifications of the aorta measuring 3.5 x 3 cm consistent with a small focal aneurysms. *There is large right and left main pulmonary arteries the right measures 3 cm in transverse diameter at its widest section the left 3.4 cm findings could correlate with pulmonary arterial hypertension. *Significant coronary artery calcifications. *Calcifications in both kidneys consistent with bilateral nephrolithiasis without hydronephrosis. *Chronic fracture deformities of T11 T9 T5 T3. No rib fractures. Electronically Signed   By: Shaaron Adler M.D.   On: 06/25/2023 09:15    Assessment/Plan Pneumonia Right lower lobe pneumonia resolving after completed course of levaquin. No fever, cough, or shortness of breath (different from baseline). Await pulmonologist's input for further imaging. - Await pulmonologist's recommendation regarding repeat CT scan.  COPD/Emphysema Extensive emphysema managed with Stiolto and as-needed albuterol. Minimal inhaler use indicates good control. Anxiety management is crucial to prevent exacerbations. - Continue Stiolto for maintenance therapy. - Use handheld nebulizer with albuterol as needed. - Monitor and manage anxiety.  Dilated Aorta Dilated aorta requires annual monitoring. Cardiologist involved. Blood pressure monitoring is essential. - Monitor blood pressure regularly. - Schedule annual CT scan to monitor aorta size.  Coronary Artery Disease Coronary artery calcification indicates well-managed coronary artery disease. On zetia, eliquis, allergy to statin Lab Results  Component Value Date   [facility] 67 03/18/2023     Osteoporosis Thoracic spine compression  deformities likely due to osteoporosis. Risk factors include postmenopausal status, smoking history, and thin body habitus. No previous treatment. - Consider discussing osteoporosis management options in future visits.  Anxiety One-time anxiety episode.. On Wellbutrin. Advised deep breathing exercises.  - Monitor for recurrent anxiety episodes. - Consider medication adjustments if anxiety persists.  Blepharitis Recurrent right eye irritation possibly due to blepharitis. No recent eye exam. - Use ocuscrub daily - Consider scheduling an annual eye exam.  Follow-up Follow-up with Dr. Delton Coombes scheduled for f/u regarding CT and pna.

## 2023-07-04 NOTE — Telephone Encounter (Signed)
 Spoke with patient's husband Beverly Li regarding CT scan advised Beverly Li is not in the office today and will leave him a message to contact Beverly Li. Beverly Li Dr.Olalere will; be in  the office on Monday .  Beverly Li's voice was understanding.  Dr.Olalere can you please advise.  Thank you

## 2023-07-04 NOTE — Telephone Encounter (Signed)
 Please call PT's husband w/CT scan results. He states he is ret a call about it. Looks as though it is a abnormal CT.

## 2023-07-07 ENCOUNTER — Telehealth: Payer: Self-pay | Admitting: Pulmonary Disease

## 2023-07-07 ENCOUNTER — Other Ambulatory Visit

## 2023-07-07 ENCOUNTER — Telehealth: Payer: Self-pay | Admitting: Emergency Medicine

## 2023-07-07 DIAGNOSIS — M24572 Contracture, left ankle: Secondary | ICD-10-CM | POA: Diagnosis not present

## 2023-07-07 DIAGNOSIS — M62552 Muscle wasting and atrophy, not elsewhere classified, left thigh: Secondary | ICD-10-CM | POA: Diagnosis not present

## 2023-07-07 DIAGNOSIS — M62551 Muscle wasting and atrophy, not elsewhere classified, right thigh: Secondary | ICD-10-CM | POA: Diagnosis not present

## 2023-07-07 DIAGNOSIS — J208 Acute bronchitis due to other specified organisms: Secondary | ICD-10-CM | POA: Diagnosis not present

## 2023-07-07 DIAGNOSIS — H10501 Unspecified blepharoconjunctivitis, right eye: Secondary | ICD-10-CM | POA: Diagnosis not present

## 2023-07-07 DIAGNOSIS — R2689 Other abnormalities of gait and mobility: Secondary | ICD-10-CM | POA: Diagnosis not present

## 2023-07-07 NOTE — Telephone Encounter (Signed)
 Sent message to [facility] for re scheduling

## 2023-07-07 NOTE — Telephone Encounter (Signed)
 The imaging dept called and the tech is out today so they resched this PY. Just an [place]

## 2023-07-07 NOTE — Telephone Encounter (Signed)
 Kindly assist with re- scheduling CT scan for 6 weeks from here-it was ordered to be done too soon

## 2023-07-09 ENCOUNTER — Ambulatory Visit
Admission: RE | Admit: 2023-07-09 | Discharge: 2023-07-09 | Disposition: A | Discharge status: Home / Self Care | Source: Ambulatory Visit | Attending: Pulmonary Disease | Admitting: Pulmonary Disease

## 2023-07-09 DIAGNOSIS — M24572 Contracture, left ankle: Secondary | ICD-10-CM | POA: Diagnosis not present

## 2023-07-09 DIAGNOSIS — R2689 Other abnormalities of gait and mobility: Secondary | ICD-10-CM | POA: Diagnosis not present

## 2023-07-09 DIAGNOSIS — M62551 Muscle wasting and atrophy, not elsewhere classified, right thigh: Secondary | ICD-10-CM | POA: Diagnosis not present

## 2023-07-09 DIAGNOSIS — I69322 Dysarthria following cerebral infarction: Secondary | ICD-10-CM | POA: Diagnosis not present

## 2023-07-09 DIAGNOSIS — J189 Pneumonia, unspecified organism: Secondary | ICD-10-CM

## 2023-07-09 DIAGNOSIS — I69328 Other speech and language deficits following cerebral infarction: Secondary | ICD-10-CM | POA: Diagnosis not present

## 2023-07-09 DIAGNOSIS — J208 Acute bronchitis due to other specified organisms: Secondary | ICD-10-CM | POA: Diagnosis not present

## 2023-07-09 DIAGNOSIS — R482 Apraxia: Secondary | ICD-10-CM | POA: Diagnosis not present

## 2023-07-09 DIAGNOSIS — M62552 Muscle wasting and atrophy, not elsewhere classified, left thigh: Secondary | ICD-10-CM | POA: Diagnosis not present

## 2023-07-09 DIAGNOSIS — R9389 Abnormal findings on diagnostic imaging of other specified body structures: Secondary | ICD-10-CM

## 2023-07-09 NOTE — Telephone Encounter (Signed)
 Did call to speak with spouse.  I had called previously and went to voice mail, left a message previously as well  Left voice message on phone regarding repeat CT in a couple of months

## 2023-07-10 NOTE — Telephone Encounter (Signed)
 NFN

## 2023-07-11 DIAGNOSIS — M62551 Muscle wasting and atrophy, not elsewhere classified, right thigh: Secondary | ICD-10-CM | POA: Diagnosis not present

## 2023-07-11 DIAGNOSIS — R2689 Other abnormalities of gait and mobility: Secondary | ICD-10-CM | POA: Diagnosis not present

## 2023-07-11 DIAGNOSIS — I69322 Dysarthria following cerebral infarction: Secondary | ICD-10-CM | POA: Diagnosis not present

## 2023-07-11 DIAGNOSIS — I69328 Other speech and language deficits following cerebral infarction: Secondary | ICD-10-CM | POA: Diagnosis not present

## 2023-07-11 DIAGNOSIS — R482 Apraxia: Secondary | ICD-10-CM | POA: Diagnosis not present

## 2023-07-11 DIAGNOSIS — M24572 Contracture, left ankle: Secondary | ICD-10-CM | POA: Diagnosis not present

## 2023-07-11 DIAGNOSIS — J208 Acute bronchitis due to other specified organisms: Secondary | ICD-10-CM | POA: Diagnosis not present

## 2023-07-11 DIAGNOSIS — M62552 Muscle wasting and atrophy, not elsewhere classified, left thigh: Secondary | ICD-10-CM | POA: Diagnosis not present

## 2023-07-14 ENCOUNTER — Non-Acute Institutional Stay (SKILLED_NURSING_FACILITY): Admitting: Internal Medicine

## 2023-07-14 DIAGNOSIS — I480 Paroxysmal atrial fibrillation: Secondary | ICD-10-CM | POA: Diagnosis not present

## 2023-07-14 DIAGNOSIS — G8191 Hemiplegia, unspecified affecting right dominant side: Secondary | ICD-10-CM | POA: Diagnosis not present

## 2023-07-14 DIAGNOSIS — M25512 Pain in left shoulder: Secondary | ICD-10-CM | POA: Diagnosis not present

## 2023-07-14 NOTE — Progress Notes (Unsigned)
 Location: Medical illustrator of Service:  SNF (31)  Provider:   Code Status: Full Code Goals of Care:     06/17/2023   10:31 AM  Advanced Directives  Does Patient Have a Medical Advance Directive? Yes  Type of Estate agent of [place];Living will  Does patient want to make changes to medical advance directive? No - Patient declined  Copy of Healthcare Power of Attorney in Chart? Yes - validated most recent copy scanned in chart (See row information)     Chief Complaint  Patient presents with   Acute Visit    HPI: Patient is a 78 y.o. 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 was 78 yrs old, when under stress the left side of face will droop.   Complication of anesthesia    "vascular OR 2016; BP bottomed out; couldn't get it regulated; ended up in ICU for DAYS" (11/18/2017)   GERD (gastroesophageal reflux disease)    History of kidney stones    Hypertension    Hypertrophic cardiomyopathy (HCC)    severe LV basilar hypertrophy witn no evidence of significant outflow tract obstruction, EF 65-70%, mild LAE, mild TR, grade 1a diastolic dysfunction 05/15/10 (Dr. Donato Schultz) (Atrial Septal Hypertrophy pattern)-- Intra-op TEE with dsignificant outflow tract obstruction - AI, MR & TR   Insomnia    Mild aortic sclerosis    Osteopenia    Peripheral vascular disease (HCC)    Syncope    , Vagal     Past Surgical History:  Procedure Laterality Date   AUGMENTATION MAMMAPLASTY Bilateral    BACK SURGERY     CARDIAC CATHETERIZATION N/A 05/07/2016   Procedure: Left Heart Cath and Coronary Angiography;  Surgeon: Kathleene Hazel, MD;  Location: [facility] INVASIVE CV LAB;  Service: Cardiovascular;  Laterality: N/A;   CARDIOVERSION N/A 09/24/2017   Procedure: CARDIOVERSION;  Surgeon: Laurey Morale, MD;  Location: [facility] ENDOSCOPY;  Service: Cardiovascular;  Laterality: N/A;   DILATION AND CURETTAGE OF UTERUS     ENDARTERECTOMY FEMORAL Right 03/02/2015   Procedure: ENDARTERECTOMY RIGHT FEMORAL;  Surgeon: Chuck Hint, MD;  Location: [facility] OR;  Service: Vascular;  Laterality: Right;   ESOPHAGOGASTRODUODENOSCOPY (EGD) WITH PROPOFOL N/A 12/01/2020   Procedure: ESOPHAGOGASTRODUODENOSCOPY (EGD) WITH PROPOFOL;  Surgeon: Diamantina Monks, MD;  Location: [facility] ENDOSCOPY;  Service: General;  Laterality: N/A;   FACIAL COSMETIC SURGERY Left 2002   "related to Bell's Palsy @ age 20; left eye/side of face droopy; tried to make area symmetrical"   FEMORAL-POPLITEAL BYPASS GRAFT Right 03/02/2015   Procedure: BYPASS GRAFT FEMORAL-BELOW KNEE POPLITEAL ARTERY;  Surgeon: Chuck Hint, MD;  Location: [facility] OR;  Service: Vascular;  Laterality: Right;   INGUINAL HERNIA REPAIR Bilateral 2002   IR ANGIO INTRA EXTRACRAN SEL COM CAROTID INNOMINATE UNI L  MOD SED  11/16/2020   IR CT HEAD LTD  11/13/2020   IR PERCUTANEOUS ART THROMBECTOMY/INFUSION INTRACRANIAL INC DIAG ANGIO  11/13/2020   OVARIAN CYST REMOVAL Left    PEG PLACEMENT N/A 12/01/2020   Procedure: PERCUTANEOUS ENDOSCOPIC GASTROSTOMY (PEG) PLACEMENT;  Surgeon: Diamantina Monks, MD;  Location: [facility] ENDOSCOPY;  Service: General;  Laterality: N/A;   PERIPHERAL VASCULAR CATHETERIZATION N/A 01/16/2015   Procedure: Abdominal Aortogram;  Surgeon: Chuck Hint, MD;  Location: [facility] INVASIVE CV LAB;  Service: Cardiovascular;  Laterality: N/A;   POSTERIOR LUMBAR  FUSION  2015   "have plates and screws in there"   RADIOLOGY WITH ANESTHESIA N/A 11/13/2020   Procedure: IR WITH ANESTHESIA;  Surgeon: Radiologist, Medication, MD;  Location: [facility] OR;  Service: Radiology;  Laterality: N/A;   TONSILLECTOMY      Allergies  Allergen Reactions   Amoxicillin Other (See Comments)    UTI Has patient had a PCN reaction causing immediate rash, facial/tongue/throat swelling, SOB or lightheadedness with hypotension: No Has patient had a PCN reaction causing severe rash involving mucus membranes or skin necrosis: No Has patient had a PCN reaction that required hospitalization: No Has patient had a PCN reaction occurring within the last 10 years: Yes--UTI ONLY If all of the above answers are "NO", then may proceed with Cephalosporin use.    Atenolol Cough   Crestor [Rosuvastatin Calcium] Other (See Comments)    Muscle aches   Pravastatin Other (See Comments)    Muscle aches   Rosuvastatin Calcium    Sulfa Antibiotics Nausea Only   Codeine Other (See Comments)    hallucinations    Outpatient Encounter Medications as of 07/14/2023  Medication Sig   acetaminophen (TYLENOL) 325 MG tablet Take 650 mg by mouth every 6 (six) hours as needed for mild pain or fever.   albuterol (VENTOLIN HFA) 108 (90 Base) MCG/ACT inhaler Inhale 2 puffs into the lungs every 6 (six) hours as needed for wheezing or shortness of breath.   amiodarone (PACERONE) 200 MG tablet Take 100 mg by mouth daily. May give crushed   amLODipine (NORVASC) 5 MG tablet Take 2.5 mg by mouth daily.   apixaban (ELIQUIS) 5 MG TABS tablet Take 5 mg by mouth 2 (two) times daily. May give crushed   bisacodyl (DULCOLAX) 5 MG EC tablet Take 5 mg by mouth 2 (two) times daily as needed for moderate constipation.   buPROPion (WELLBUTRIN XL) 150 MG 24 hr tablet Take 150 mg by mouth daily.   diclofenac Sodium (VOLTAREN) 1 % GEL Apply one application to right hip and knee four times daily as needed.   Emollient (CERAVE)  CREA Apply 1 Application topically as needed.   Eyelid Cleansers (OCUSOFT EYELID CLEANSING) PADS Apply 1 Application topically daily.   ezetimibe (ZETIA) 10 MG tablet Take 10 mg by mouth daily. Oral if crushed   furosemide (LASIX) 20 MG tablet Take 20 mg by mouth daily as needed. Give Lasix 20mg  for weight gain equal or greater than 3 pounds   hydrALAZINE (APRESOLINE) 10 MG tablet Take 10 mg by mouth 3 (three) times daily as needed (SBP > 165 or DBP > 95).   hydrocortisone cream 1 % Apply 1 Application topically 2 (two) times daily as needed for itching.   ipratropium-albuterol (DUONEB) 0.5-2.5 (3) MG/3ML SOLN Inhale 3 mLs into the lungs every 4 (four) hours as needed.   ketoconazole (NIZORAL) 2 % cream Apply 1 application  topically daily as needed for irritation.   menthol-cetylpyridinium (  CEPACOL) 3 MG lozenge Take 1 lozenge (3 mg total) by mouth every 4 (four) hours as needed for sore throat.   nystatin cream (MYCOSTATIN) Apply 1 application topically 2 (two) times daily as needed for dry skin (apply to perirectal rash).   ondansetron (ZOFRAN) 4 MG tablet Take 4 mg by mouth every 6 (six) hours as needed for nausea or vomiting.   OXYGEN Inhale 3 L into the lungs as directed. to maintain sats > or equal to 90%. May titrate Three Times A Day; 07:00 AM - 03:00 PM, 03:00 PM - 11:00 PM, 11:00 PM - 7:00 am   pantoprazole (PROTONIX) 40 MG tablet Take 40 mg by mouth 2 (two) times daily.   Polyethyl Glycol-Propyl Glycol (SYSTANE) 0.4-0.3 % SOLN Apply 2 drops to eye every 6 (six) hours as needed (dry eyes).   polyethylene glycol powder (GLYCOLAX/MIRALAX) 17 GM/SCOOP powder Take 17 g by mouth every other day.   sennosides-docusate sodium (SENOKOT-S) 8.6-50 MG tablet Take 2 tablets by mouth 2 (two) times daily.   Tiotropium Bromide-Olodaterol (STIOLTO RESPIMAT) 2.5-2.5 MCG/ACT AERS Inhale 2 puffs into the lungs daily.   zinc oxide 20 % ointment Apply 1 Application topically 2 (two) times daily as needed for  irritation. apply to peri rectal area BID PRN for rash-use with Nystatin   [DISCONTINUED] cetirizine (ZYRTEC) 10 MG tablet Take 1 tablet (10 mg total) by mouth daily for 14 days.   [DISCONTINUED] fluticasone (FLONASE) 50 MCG/ACT nasal spray Place 2 sprays into both nostrils daily for 14 days.   Facility-Administered Encounter Medications as of 07/14/2023  Medication   botulinum toxin Type A (BOTOX) injection 100 Units    Review of Systems:  Review of Systems  Constitutional:  Positive for activity change. Negative for appetite change.  HENT: Negative.    Respiratory:  Negative for cough and shortness of breath.   Cardiovascular:  Negative for leg swelling.  Gastrointestinal:  Negative for constipation.  Genitourinary: Negative.   Musculoskeletal:  Positive for arthralgias. Negative for gait problem and myalgias.  Skin: Negative.   Neurological:  Negative for dizziness and weakness.  Psychiatric/Behavioral:  Negative for confusion, dysphoric mood and sleep disturbance.     Health Maintenance  Topic Date Due   Zoster Vaccines- Shingrix (1 of 2) 04/24/1964   COVID-19 Vaccine (7 - 2024-25 season) 04/01/2023   DTaP/Tdap/Td (3 - Td or Tdap) 05/20/2023   Medicare Annual Wellness (AWV)  08/13/2023   Lung Cancer Screening  07/08/2024   Pneumonia Vaccine 6+ Years old  Completed   INFLUENZA VACCINE  Completed   Hepatitis C Screening  Completed   HPV VACCINES  Aged Out   DEXA SCAN  Discontinued    Physical Exam: Vitals:   07/14/23 1443  BP: 99/66  Pulse: 63  Resp: 16  Temp: 97.6 F (36.4 C)   There is no height or weight on file to calculate BMI. Physical Exam Vitals reviewed.  Constitutional:      Appearance: Normal appearance.  HENT:     Head: Normocephalic.     Nose: Nose normal.     Mouth/Throat:     Mouth: Mucous membranes are moist.     Pharynx: Oropharynx is clear.  Eyes:     Pupils: Pupils are equal, round, and reactive to light.  Cardiovascular:     Rate and  Rhythm: Normal rate. Rhythm irregular.     Pulses: Normal pulses.     Heart sounds: Normal heart sounds. No murmur heard. Pulmonary:  Effort: Pulmonary effort is normal.     Breath sounds: Normal breath sounds.  Abdominal:     General: Abdomen is flat. Bowel sounds are normal.     Palpations: Abdomen is soft.  Musculoskeletal:        General: No swelling.     Cervical back: Neck supple.     Comments: Pain in her Left Shoulder When I do any kind of Movement But then she relaxed and Was able to Abduct it but not completely  Restricted due to Pain No Point tenderness  Skin:    General: Skin is warm.  Neurological:     Mental Status: She is alert and oriented to person, place, and time.  Psychiatric:        Mood and Affect: Mood normal.        Thought Content: Thought content normal.     Labs reviewed: Basic Metabolic Panel: Recent Labs    08/08/22 0000 03/18/23 0000 05/21/23 0000  NA  --  140 142  K  --  4.4 4.2  CL  --  106 107  CO2  --  22 25*  BUN  --  18 24*  CREATININE  --  0.8 1.0  CALCIUM  --  10.0 8.9  TSH 0.61  --   --    Liver Function Tests: Recent Labs    03/18/23 0000 05/21/23 0000  AST 27 30  ALT 23 35  ALKPHOS 96 93  ALBUMIN 4.2 3.4*   No results for input(s): "LIPASE", "AMYLASE" in the last 8760 hours. No results for input(s): "AMMONIA" in the last 8760 hours. CBC: Recent Labs    09/19/22 0000 03/18/23 0000 05/21/23 0000  WBC 7.4 4.0 7.2  HGB 14.0 12.5 13.6  HCT 43 38 41  PLT 246 180 246   Lipid Panel: Recent Labs    03/18/23 0000  CHOL 164  HDL 61  LDLCALC 67  TRIG 179*   Lab Results  Component Value Date   HGBA1C 6.0 (H) 11/13/2020    Procedures since last visit: CT Chest Wo Contrast Result Date: 07/14/2023 CLINICAL DATA:  Abnormal CT chest showing left lower lobe pneumonia EXAM: CT CHEST WITHOUT CONTRAST TECHNIQUE: Multidetector CT imaging of the chest was performed following the standard protocol without IV  contrast. RADIATION DOSE REDUCTION: This exam was performed according to the departmental dose-optimization program which includes automated exposure control, adjustment of the mA and/or kV according to patient size and/or use of iterative reconstruction technique. COMPARISON:  CT chest June 24, 2023 FINDINGS: Cardiovascular: No change in the previously described dilatation of the ascending aorta measuring 4.2 x 4.2 cm proximally and 3.8 x 4.5 cm distally with calcifications of the aortic arch. No change in the size of the pulmonary arteries which correlate with pulmonary arterial hypertension. Mediastinum/Nodes: No enlarged mediastinal or axillary lymph nodes. Thyroid gland, trachea, and esophagus demonstrate no significant findings. Lungs/Pleura: Comparison with prior examination accounting for differences in techniques no significant change in the consolidated right lower lobe pneumonia and bronchial dilatation. Chronic interstitial lung disease. Centrilobular and paraseptal emphysema. No change in the pleural-based parenchymal density of the superior segment of the left lower lobe measuring 9 mm in maximum transverse diameter 3 mm in maximum thickness. Consistent with a pleural plaque. Stable fissural nodules Upper Abdomen: No acute abnormality. With bilateral renal calcifications unchanged since prior examination. Musculoskeletal: Comparison with prior examinations demonstrates no significant change in the multiple old compression fracture deformities of the thoracic spine T11, T9,  T5 and T3. IMPRESSION: No change in the right lower lobe pneumonia Electronically Signed   By: Shaaron Adler M.D.   On: 07/14/2023 13:20   DG Chest 2 View Result Date: 07/07/2023 CLINICAL DATA:  78 year old female with a history of cough EXAM: CHEST - 2 VIEW COMPARISON:  12/07/2021, 10/08/2021 FINDINGS: Cardiomediastinal silhouette unchanged in size and contour. New opacity at the right lung base obscuring the right hemidiaphragm  and the right heart border. This corresponds to similar opacity at the posterior lung base on the lateral view. Stigmata of emphysema, with increased retrosternal airspace, flattened hemidiaphragms, increased AP diameter, and hyperinflation on the AP view. Coarsened interstitial markings.  No pneumothorax. Degenerative changes of the spine similar to the comparison. No displaced acute fracture identified. Vascular calcifications. IMPRESSION: Opacity at the right lung base, favored to represent pneumonia given the history. Either follow-up chest x-ray after therapy or proceed with chest CT would be recommended. Electronically Signed   By: Gilmer Mor D.O.   On: 07/07/2023 11:24   CT Chest Wo Contrast Result Date: 06/25/2023 CLINICAL DATA:  Chronic cough persistent for over 8 weeks EXAM: CT CHEST WITHOUT CONTRAST TECHNIQUE: Multidetector CT imaging of the chest was performed following the standard protocol without IV contrast. RADIATION DOSE REDUCTION: This exam was performed according to the departmental dose-optimization program which includes automated exposure control, adjustment of the mA and/or kV according to patient size and/or use of iterative reconstruction technique. COMPARISON:  Chest x-ray Aug 18, 2018 fifth CT chest December 05, 2020 FINDINGS: Cardiovascular: Dilatation of the ascending aorta measuring 4.2 x 4.2 cm proximally and 3.8 x 4.5 cm distally with some calcifications of the aortic wall in the aortic arch. The descending thoracic aorta normal measures 2.7 by 2.8 cm proximally and 2.7 x 2.6 cm distally. There is moderate dilatation of the thoracoabdominal aortic junction with significant calcifications of the aorta measuring 3.5 x 3 0.0 cm consistent with a small focal aneurysms. There is large right and left main pulmonary arteries the right measures 3 cm in transverse diameter at its widest section the left 3.4 cm findings could correlate with pulmonary arterial hypertension. Significant  coronary artery calcifications No pericardial effusion Mediastinum/Nodes: No enlarged mediastinal or axillary lymph nodes. Thyroid gland, trachea, and esophagus demonstrate no significant findings. Lungs/Pleura: Extensive centrilobular emphysema. Right lower lobe consolidative pneumonia. With some right lower lobe bronchiectasis. No pleural effusions 2 mm nodules in the left upper lobe apicoposterior segment against the fissure consistent with fissural nodule no other significant pulmonary nodules. Upper Abdomen: Calcifications in both kidneys consistent with bilateral nephrolithiasis without hydronephrosis Musculoskeletal: Calcified bilateral breast implants. Chronic fracture deformities of T11 T9 T5 T3.  No rib fractures. IMPRESSION: *Right lower lobe consolidative pneumonia. *Extensive centrilobular emphysema. *Dilatation of the ascending aorta measuring 4.2 x 4.2 cm proximally and 3.8 x 4.5 cm distally with some calcifications of the aortic wall in the aortic arch. *There is moderate dilatation of the thoracoabdominal aortic junction with significant calcifications of the aorta measuring 3.5 x 3 cm consistent with a small focal aneurysms. *There is large right and left main pulmonary arteries the right measures 3 cm in transverse diameter at its widest section the left 3.4 cm findings could correlate with pulmonary arterial hypertension. *Significant coronary artery calcifications. *Calcifications in both kidneys consistent with bilateral nephrolithiasis without hydronephrosis. *Chronic fracture deformities of T11 T9 T5 T3. No rib fractures. Electronically Signed   By: Shaaron Adler M.D.   On: 06/25/2023 09:15    Assessment/Plan  1. Acute pain of left shoulder (Primary) Prednisone 20 mg every day with meals for 1 week Tramadol 25 mg Q6 hour PRN for 1 week Urgent referral to Ortho  2. Right hemiplegia (HCC) Works with therapy  3. PAF (paroxysmal atrial fibrillation) (HCC) On Eliquis    Labs/tests  ordered:  * No order type specified * Next appt:  Visit date not found

## 2023-07-15 ENCOUNTER — Other Ambulatory Visit: Payer: Self-pay | Admitting: Orthopedic Surgery

## 2023-07-15 DIAGNOSIS — M25522 Pain in left elbow: Secondary | ICD-10-CM | POA: Diagnosis not present

## 2023-07-15 DIAGNOSIS — M25512 Pain in left shoulder: Secondary | ICD-10-CM

## 2023-07-15 DIAGNOSIS — M7022 Olecranon bursitis, left elbow: Secondary | ICD-10-CM | POA: Diagnosis not present

## 2023-07-15 MED ORDER — TRAMADOL HCL 50 MG PO TABS: 25.0000 mg | ORAL_TABLET | Freq: Four times a day (QID) | ORAL | 0 refills | Status: AC | PRN

## 2023-07-21 DIAGNOSIS — M62552 Muscle wasting and atrophy, not elsewhere classified, left thigh: Secondary | ICD-10-CM | POA: Diagnosis not present

## 2023-07-21 DIAGNOSIS — G8111 Spastic hemiplegia affecting right dominant side: Secondary | ICD-10-CM | POA: Diagnosis not present

## 2023-07-21 DIAGNOSIS — J208 Acute bronchitis due to other specified organisms: Secondary | ICD-10-CM | POA: Diagnosis not present

## 2023-07-21 DIAGNOSIS — M24572 Contracture, left ankle: Secondary | ICD-10-CM | POA: Diagnosis not present

## 2023-07-21 DIAGNOSIS — R2689 Other abnormalities of gait and mobility: Secondary | ICD-10-CM | POA: Diagnosis not present

## 2023-07-21 DIAGNOSIS — M62551 Muscle wasting and atrophy, not elsewhere classified, right thigh: Secondary | ICD-10-CM | POA: Diagnosis not present

## 2023-07-23 DIAGNOSIS — M62551 Muscle wasting and atrophy, not elsewhere classified, right thigh: Secondary | ICD-10-CM | POA: Diagnosis not present

## 2023-07-23 DIAGNOSIS — M62552 Muscle wasting and atrophy, not elsewhere classified, left thigh: Secondary | ICD-10-CM | POA: Diagnosis not present

## 2023-07-23 DIAGNOSIS — J208 Acute bronchitis due to other specified organisms: Secondary | ICD-10-CM | POA: Diagnosis not present

## 2023-07-23 DIAGNOSIS — R2689 Other abnormalities of gait and mobility: Secondary | ICD-10-CM | POA: Diagnosis not present

## 2023-07-23 DIAGNOSIS — M24572 Contracture, left ankle: Secondary | ICD-10-CM | POA: Diagnosis not present

## 2023-07-24 DIAGNOSIS — R482 Apraxia: Secondary | ICD-10-CM | POA: Diagnosis not present

## 2023-07-24 DIAGNOSIS — I69322 Dysarthria following cerebral infarction: Secondary | ICD-10-CM | POA: Diagnosis not present

## 2023-07-24 DIAGNOSIS — I69328 Other speech and language deficits following cerebral infarction: Secondary | ICD-10-CM | POA: Diagnosis not present

## 2023-07-28 ENCOUNTER — Telehealth: Payer: Self-pay | Admitting: Neurology

## 2023-07-28 NOTE — Telephone Encounter (Signed)
 Submitted auth request via [employer] portal, status is pending with case # [id number].

## 2023-07-29 NOTE — Telephone Encounter (Signed)
 Auth was approved, pt will continue to be B/B. Auth#: [id number] (07/28/23-07/27/24)

## 2023-07-30 ENCOUNTER — Ambulatory Visit: Admitting: Emergency Medicine

## 2023-07-31 NOTE — Telephone Encounter (Signed)
 NFN

## 2023-08-04 DIAGNOSIS — M62552 Muscle wasting and atrophy, not elsewhere classified, left thigh: Secondary | ICD-10-CM | POA: Diagnosis not present

## 2023-08-04 DIAGNOSIS — R2689 Other abnormalities of gait and mobility: Secondary | ICD-10-CM | POA: Diagnosis not present

## 2023-08-04 DIAGNOSIS — M62551 Muscle wasting and atrophy, not elsewhere classified, right thigh: Secondary | ICD-10-CM | POA: Diagnosis not present

## 2023-08-04 DIAGNOSIS — J208 Acute bronchitis due to other specified organisms: Secondary | ICD-10-CM | POA: Diagnosis not present

## 2023-08-04 DIAGNOSIS — M24572 Contracture, left ankle: Secondary | ICD-10-CM | POA: Diagnosis not present

## 2023-08-06 DIAGNOSIS — M24572 Contracture, left ankle: Secondary | ICD-10-CM | POA: Diagnosis not present

## 2023-08-06 DIAGNOSIS — M62551 Muscle wasting and atrophy, not elsewhere classified, right thigh: Secondary | ICD-10-CM | POA: Diagnosis not present

## 2023-08-06 DIAGNOSIS — M62552 Muscle wasting and atrophy, not elsewhere classified, left thigh: Secondary | ICD-10-CM | POA: Diagnosis not present

## 2023-08-06 DIAGNOSIS — J208 Acute bronchitis due to other specified organisms: Secondary | ICD-10-CM | POA: Diagnosis not present

## 2023-08-06 DIAGNOSIS — R2689 Other abnormalities of gait and mobility: Secondary | ICD-10-CM | POA: Diagnosis not present

## 2023-08-07 ENCOUNTER — Encounter: Payer: Self-pay | Admitting: Emergency Medicine

## 2023-08-07 ENCOUNTER — Ambulatory Visit: Admitting: Emergency Medicine

## 2023-08-07 VITALS — BP 160/81 | HR 60 | Ht 63.5 in | Wt 98.0 lb

## 2023-08-07 DIAGNOSIS — R0902 Hypoxemia: Secondary | ICD-10-CM

## 2023-08-07 DIAGNOSIS — R9389 Abnormal findings on diagnostic imaging of other specified body structures: Secondary | ICD-10-CM

## 2023-08-07 DIAGNOSIS — J449 Chronic obstructive pulmonary disease, unspecified: Secondary | ICD-10-CM | POA: Diagnosis not present

## 2023-08-07 DIAGNOSIS — Z87891 Personal history of nicotine dependence: Secondary | ICD-10-CM | POA: Diagnosis not present

## 2023-08-07 DIAGNOSIS — J439 Emphysema, unspecified: Secondary | ICD-10-CM

## 2023-08-07 NOTE — Patient Instructions (Addendum)
 We will plan to repeat your CT scan of the chest in mid June 2025 to compare with your priors. Please continue your Stiolto 2 puffs once daily. Keep your DuoNeb available to use 1 nebulizer treatment up to every 6 hours if needed for shortness of breath, chest tightness, wheezing. Keep your albuterol  available to use 2 puffs if needed for shortness of breath Continue your oxygen  as you have been using it Follow with Dr. Baldwin Levee in June after your CT chest so we can review those results together.

## 2023-08-07 NOTE — Assessment & Plan Note (Signed)
 Abnormal CT scan of the chest from 06/24/2023 and then repeated 07/09/2023 with an area of right lower lobe infiltrate and a more nodular associated peripheral density.  Unchanged in the short interval.  Suspect that this is either resolving pneumonia or scar from prior aspiration pneumonias given her history.  Will need to be followed for either clearance or interval stability.  Plan to repeat her CT scan of the chest at the 6-month mark which would be late June

## 2023-08-07 NOTE — Assessment & Plan Note (Signed)
Continue your oxygen as you have been using it. 

## 2023-08-07 NOTE — Assessment & Plan Note (Signed)
 Please continue your Stiolto 2 puffs once daily. Keep your DuoNeb available to use 1 nebulizer treatment up to every 6 hours if needed for shortness of breath, chest tightness, wheezing. Keep your albuterol  available to use 2 puffs if needed for shortness of breath

## 2023-08-07 NOTE — Progress Notes (Signed)
 Subjective:    Patient ID: Beverly Li, female    DOB: 04/07/1946, 78 y.o.   MRN: [id number]  HPI  ROV 08/07/23 --follow-up visit 78 year old woman with history of severe COPD and associated chronic hypoxemic respiratory failure.  History of atrial fibrillation, HOCM, peripheral vascular disease, CVA and dysphagia with suspected intermittent aspiration.  She had flaring symptoms and cough in March and was seen by Dr. Gaynell Keeler at that time.  A CT chest was performed 06/24/2023 that showed aortic dilatation, extensive centrilobular emphysema and a right lower lobe pneumonia with some associated bronchiectasis. She is feeling better, less cough. She was never producing a lot of mucous. No hemoptysis. She is on Stiolto, uses DuoNeb rarely.   CT scan of the chest 07/09/2023 showed no significant change in the right lower lobe infiltrate.  There is a stable 9 mm pleural-based parenchymal density in the superior segment left lower lobe consistent with a pleural plaque.  Stable fissural nodules   Review of Systems  Constitutional:  Negative for fever and unexpected weight change.  HENT:  Negative for congestion, dental problem, ear pain, nosebleeds, postnasal drip, rhinorrhea, sinus pressure, sneezing, sore throat and trouble swallowing.   Eyes:  Negative for redness and itching.  Respiratory:  Positive for chest tightness and shortness of breath. Negative for cough and wheezing.   Cardiovascular:  Negative for palpitations and leg swelling.  Gastrointestinal:  Negative for nausea and vomiting.  Genitourinary:  Negative for dysuria.  Musculoskeletal:  Negative for joint swelling.  Skin:  Negative for rash.  Neurological:  Negative for headaches.  Hematological:  Does not bruise/bleed easily.  Psychiatric/Behavioral:  Negative for dysphoric mood. The patient is not nervous/anxious.        Objective:   Physical Exam Vitals:   08/07/23 1347 08/07/23 1349  BP: (!) 156/74 (!) 160/81  Pulse: 60    SpO2: 96%   Weight: 98 lb (44.5 kg)   Height: 5' 3.5" (1.613 m)      Gen: Pleasant, thin woman, in no distress,  normal affect  ENT: No lesions,  mouth clear,  oropharynx clear, no postnasal drip  Neck: No JVD, no stridor  Lungs: No use of accessory muscles, clear on normal respiration.  Crackles at both bases, no wheezing  Cardiovascular: RRR, heart sounds normal, no murmur or gallops, no peripheral edema  Musculoskeletal: No deformities, no cyanosis or clubbing  Neuro: awake, some dysarthria, subtle facial droop.   Skin: Warm, no lesions or rashes      Assessment & Plan:  Abnormal CT of the chest Abnormal CT scan of the chest from 06/24/2023 and then repeated 07/09/2023 with an area of right lower lobe infiltrate and a more nodular associated peripheral density.  Unchanged in the short interval.  Suspect that this is either resolving pneumonia or scar from prior aspiration pneumonias given her history.  Will need to be followed for either clearance or interval stability.  Plan to repeat her CT scan of the chest at the 3-month mark which would be late June  COPD (chronic obstructive pulmonary disease) (HCC) Please continue your Stiolto 2 puffs once daily. Keep your DuoNeb available to use 1 nebulizer treatment up to every 6 hours if needed for shortness of breath, chest tightness, wheezing. Keep your albuterol  available to use 2 puffs if needed for shortness of breath  Hypoxia Continue your oxygen  as you have been using it    Racheal Buddle, MD, PhD 08/07/2023, 2:15 PM [facility]  [phone] or if no answer [phone]

## 2023-08-08 DIAGNOSIS — M62552 Muscle wasting and atrophy, not elsewhere classified, left thigh: Secondary | ICD-10-CM | POA: Diagnosis not present

## 2023-08-08 DIAGNOSIS — J208 Acute bronchitis due to other specified organisms: Secondary | ICD-10-CM | POA: Diagnosis not present

## 2023-08-08 DIAGNOSIS — R2689 Other abnormalities of gait and mobility: Secondary | ICD-10-CM | POA: Diagnosis not present

## 2023-08-08 DIAGNOSIS — M62551 Muscle wasting and atrophy, not elsewhere classified, right thigh: Secondary | ICD-10-CM | POA: Diagnosis not present

## 2023-08-08 DIAGNOSIS — M24572 Contracture, left ankle: Secondary | ICD-10-CM | POA: Diagnosis not present

## 2023-08-11 ENCOUNTER — Non-Acute Institutional Stay (SKILLED_NURSING_FACILITY): Admitting: Internal Medicine

## 2023-08-11 ENCOUNTER — Encounter: Payer: Self-pay | Admitting: Internal Medicine

## 2023-08-11 DIAGNOSIS — I69328 Other speech and language deficits following cerebral infarction: Secondary | ICD-10-CM | POA: Diagnosis not present

## 2023-08-11 DIAGNOSIS — K5901 Slow transit constipation: Secondary | ICD-10-CM | POA: Diagnosis not present

## 2023-08-11 DIAGNOSIS — R2689 Other abnormalities of gait and mobility: Secondary | ICD-10-CM | POA: Diagnosis not present

## 2023-08-11 DIAGNOSIS — J208 Acute bronchitis due to other specified organisms: Secondary | ICD-10-CM | POA: Diagnosis not present

## 2023-08-11 DIAGNOSIS — M24572 Contracture, left ankle: Secondary | ICD-10-CM | POA: Diagnosis not present

## 2023-08-11 DIAGNOSIS — J439 Emphysema, unspecified: Secondary | ICD-10-CM

## 2023-08-11 DIAGNOSIS — M62552 Muscle wasting and atrophy, not elsewhere classified, left thigh: Secondary | ICD-10-CM | POA: Diagnosis not present

## 2023-08-11 DIAGNOSIS — R482 Apraxia: Secondary | ICD-10-CM | POA: Diagnosis not present

## 2023-08-11 DIAGNOSIS — E782 Mixed hyperlipidemia: Secondary | ICD-10-CM

## 2023-08-11 DIAGNOSIS — M62551 Muscle wasting and atrophy, not elsewhere classified, right thigh: Secondary | ICD-10-CM | POA: Diagnosis not present

## 2023-08-11 DIAGNOSIS — G8191 Hemiplegia, unspecified affecting right dominant side: Secondary | ICD-10-CM

## 2023-08-11 DIAGNOSIS — J189 Pneumonia, unspecified organism: Secondary | ICD-10-CM

## 2023-08-11 DIAGNOSIS — I10 Essential (primary) hypertension: Secondary | ICD-10-CM

## 2023-08-11 DIAGNOSIS — I480 Paroxysmal atrial fibrillation: Secondary | ICD-10-CM

## 2023-08-11 DIAGNOSIS — I69322 Dysarthria following cerebral infarction: Secondary | ICD-10-CM | POA: Diagnosis not present

## 2023-08-11 NOTE — Progress Notes (Signed)
 Location:  Medical illustrator of Service:  SNF (31)  Provider:   Code Status:  Goals of Care:     06/17/2023   10:31 AM  Advanced Directives  Does Patient Have a Medical Advance Directive? Yes  Type of Estate agent of [place];Living will  Does patient want to make changes to medical advance directive? No - Patient declined  Copy of Healthcare Power of Attorney in Chart? Yes - validated most recent copy scanned in chart (See row information)     Chief Complaint  Patient presents with   Care Management    HPI: Patient is a 78 y.o. female seen today for medical management of chronic diseases.    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  COPD on 2 l of Oxygen   Left Shoulder pain Saw Ortho Was diagnosed with Bursitis and Given High dose of Prednisone  taper Patient says her Arm is now back to normal  Right LL Pneumonia Treated with Long course of Antibiotics Has recovered now Repeat CT of chest in 6 weeks per [name]  Right Hemiplegia Works with ST and PT and OT Able to walk with assist  Wt Readings from Last 3 Encounters:  08/11/23 88 lb (39.9 kg)  08/07/23 98 lb (44.5 kg)  06/18/23 94 lb (42.6 kg)     Past Medical History:  Diagnosis Date   Anxiety    Arthritis    "some in my lower back; probably elbows, knees" (11/18/2017)   Atrial fibrillation (HCC)    Bell's palsy    when pt. was 78 yrs old, when under stress the left side of face will droop.   Complication of anesthesia    "vascular OR 2016; BP bottomed out; couldn't get it regulated; ended up in ICU for DAYS" (11/18/2017)   GERD (gastroesophageal reflux disease)    History of kidney stones    Hypertension    Hypertrophic cardiomyopathy (HCC)    severe LV basilar hypertrophy witn no evidence of significant outflow tract obstruction, EF 65-70%, mild LAE, mild TR,  grade 1a diastolic dysfunction 05/15/10 (Dr. Dorothye Gathers) (Atrial Septal Hypertrophy pattern)-- Intra-op TEE with dsignificant outflow tract obstruction - AI, MR & TR   Insomnia    Mild aortic sclerosis    Osteopenia    Peripheral vascular disease (HCC)    Syncope    , Vagal    Past Surgical History:  Procedure Laterality Date   AUGMENTATION MAMMAPLASTY Bilateral    BACK SURGERY     CARDIAC CATHETERIZATION N/A 05/07/2016   Procedure: Left Heart Cath and Coronary Angiography;  Surgeon: Odie Benne, MD;  Location: [facility] INVASIVE CV LAB;  Service: Cardiovascular;  Laterality: N/A;   CARDIOVERSION N/A 09/24/2017   Procedure: CARDIOVERSION;  Surgeon: Darlis Eisenmenger, MD;  Location: [facility] ENDOSCOPY;  Service: Cardiovascular;  Laterality: N/A;   DILATION AND CURETTAGE OF UTERUS     ENDARTERECTOMY FEMORAL Right 03/02/2015   Procedure: ENDARTERECTOMY RIGHT FEMORAL;  Surgeon: Dannis Dy, MD;  Location: [facility] OR;  Service: Vascular;  Laterality: Right;   ESOPHAGOGASTRODUODENOSCOPY (EGD) WITH PROPOFOL  N/A 12/01/2020   Procedure: ESOPHAGOGASTRODUODENOSCOPY (EGD) WITH PROPOFOL ;  Surgeon: Anda Bamberg, MD;  Location: [facility] ENDOSCOPY;  Service: General;  Laterality: N/A;   FACIAL COSMETIC SURGERY Left 2002   "related to Bell's Palsy @ age 1; left eye/side of  face droopy; tried to make area symmetrical"   FEMORAL-POPLITEAL BYPASS GRAFT Right 03/02/2015   Procedure: BYPASS GRAFT FEMORAL-BELOW KNEE POPLITEAL ARTERY;  Surgeon: Dannis Dy, MD;  Location: [facility] OR;  Service: Vascular;  Laterality: Right;   INGUINAL HERNIA REPAIR Bilateral 2002   IR ANGIO INTRA EXTRACRAN SEL COM CAROTID INNOMINATE UNI L MOD SED  11/16/2020   IR CT HEAD LTD  11/13/2020   IR PERCUTANEOUS ART THROMBECTOMY/INFUSION INTRACRANIAL INC DIAG ANGIO  11/13/2020   OVARIAN CYST REMOVAL Left    PEG PLACEMENT N/A 12/01/2020   Procedure: PERCUTANEOUS ENDOSCOPIC GASTROSTOMY (PEG) PLACEMENT;  Surgeon: Anda Bamberg, MD;   Location: [facility] ENDOSCOPY;  Service: General;  Laterality: N/A;   PERIPHERAL VASCULAR CATHETERIZATION N/A 01/16/2015   Procedure: Abdominal Aortogram;  Surgeon: Dannis Dy, MD;  Location: [facility] INVASIVE CV LAB;  Service: Cardiovascular;  Laterality: N/A;   POSTERIOR LUMBAR FUSION  2015   "have plates and screws in there"   RADIOLOGY WITH ANESTHESIA N/A 11/13/2020   Procedure: IR WITH ANESTHESIA;  Surgeon: Radiologist, Medication, MD;  Location: [facility] OR;  Service: Radiology;  Laterality: N/A;   TONSILLECTOMY      Allergies  Allergen Reactions   Amoxicillin Other (See Comments)    UTI Has patient had a PCN reaction causing immediate rash, facial/tongue/throat swelling, SOB or lightheadedness with hypotension: No Has patient had a PCN reaction causing severe rash involving mucus membranes or skin necrosis: No Has patient had a PCN reaction that required hospitalization: No Has patient had a PCN reaction occurring within the last 10 years: Yes--UTI ONLY If all of the above answers are "NO", then may proceed with Cephalosporin use.    Atenolol Cough   Crestor  [Rosuvastatin  Calcium ] Other (See Comments)    Muscle aches   Pravastatin  Other (See Comments)    Muscle aches   Rosuvastatin  Calcium     Sulfa Antibiotics Nausea Only   Codeine Other (See Comments)    hallucinations    Outpatient Encounter Medications as of 08/11/2023  Medication Sig   furosemide  (LASIX ) 20 MG tablet Take 20 mg by mouth daily as needed. Give Lasix  20mg  for weight gain equal or greater than 3 pounds   ipratropium-albuterol  (DUONEB) 0.5-2.5 (3) MG/3ML SOLN Inhale 3 mLs into the lungs every 4 (four) hours as needed.   acetaminophen  (TYLENOL ) 325 MG tablet Take 650 mg by mouth every 6 (six) hours as needed for mild pain or fever.   albuterol  (VENTOLIN  HFA) 108 (90 Base) MCG/ACT inhaler Inhale 2 puffs into the lungs every 6 (six) hours as needed for wheezing or shortness of breath.   amiodarone  (PACERONE ) 200 MG tablet  Take 100 mg by mouth daily. May give crushed   amLODipine  (NORVASC ) 5 MG tablet Take 2.5 mg by mouth daily.   apixaban  (ELIQUIS ) 5 MG TABS tablet Take 5 mg by mouth 2 (two) times daily. May give crushed   bisacodyl  (DULCOLAX) 5 MG EC tablet Take 5 mg by mouth 2 (two) times daily as needed for moderate constipation.   buPROPion  (WELLBUTRIN  XL) 150 MG 24 hr tablet Take 150 mg by mouth daily.   diclofenac Sodium (VOLTAREN) 1 % GEL Apply one application to right hip and knee four times daily as needed.   Emollient (CERAVE) CREA Apply 1 Application topically as needed.   Eyelid Cleansers (OCUSOFT EYELID CLEANSING) PADS Apply 1 Application topically daily.   ezetimibe  (ZETIA ) 10 MG tablet Take 10 mg by mouth daily. Oral if crushed   hydrALAZINE  (APRESOLINE ) 10  MG tablet Take 10 mg by mouth 3 (three) times daily as needed (SBP > 165 or DBP > 95).   hydrocortisone cream 1 % Apply 1 Application topically 2 (two) times daily as needed for itching.   ketoconazole  (NIZORAL ) 2 % cream Apply 1 application  topically daily as needed for irritation.   menthol -cetylpyridinium (CEPACOL) 3 MG lozenge Take 1 lozenge (3 mg total) by mouth every 4 (four) hours as needed for sore throat.   nystatin cream (MYCOSTATIN) Apply 1 application topically 2 (two) times daily as needed for dry skin (apply to perirectal rash).   ondansetron  (ZOFRAN ) 4 MG tablet Take 4 mg by mouth every 6 (six) hours as needed for nausea or vomiting.   OXYGEN  Inhale 3 L into the lungs as directed. to maintain sats > or equal to 90%. May titrate Three Times A Day; 07:00 AM - 03:00 PM, 03:00 PM - 11:00 PM, 11:00 PM - 7:00 am   pantoprazole  (PROTONIX ) 40 MG tablet Take 40 mg by mouth 2 (two) times daily.   Polyethyl Glycol-Propyl Glycol (SYSTANE) 0.4-0.3 % SOLN Apply 2 drops to eye every 6 (six) hours as needed (dry eyes).   polyethylene glycol powder (GLYCOLAX /MIRALAX ) 17 GM/SCOOP powder Take 17 g by mouth every other day.   sennosides-docusate  sodium (SENOKOT-S) 8.6-50 MG tablet Take 2 tablets by mouth 2 (two) times daily.   Tiotropium Bromide-Olodaterol (STIOLTO RESPIMAT ) 2.5-2.5 MCG/ACT AERS Inhale 2 puffs into the lungs daily.   zinc oxide 20 % ointment Apply 1 Application topically 2 (two) times daily as needed for irritation. apply to peri rectal area BID PRN for rash-use with Nystatin   Facility-Administered Encounter Medications as of 08/11/2023  Medication   botulinum toxin Type A  (BOTOX ) injection 100 Units    Review of Systems:  Review of Systems  Constitutional:  Negative for activity change and appetite change.  HENT: Negative.    Respiratory:  Negative for cough and shortness of breath.   Cardiovascular:  Negative for leg swelling.  Gastrointestinal:  Negative for constipation.  Genitourinary: Negative.   Musculoskeletal:  Positive for gait problem. Negative for arthralgias and myalgias.  Skin: Negative.   Neurological:  Negative for dizziness and weakness.  Psychiatric/Behavioral:  Positive for sleep disturbance. Negative for confusion and dysphoric mood.     Health Maintenance  Topic Date Due   Zoster Vaccines- Shingrix (1 of 2) 04/24/1964   COVID-19 Vaccine (7 - 2024-25 season) 04/01/2023   DTaP/Tdap/Td (3 - Td or Tdap) 05/20/2023   Medicare Annual Wellness (AWV)  08/13/2023   INFLUENZA VACCINE  11/14/2023   Lung Cancer Screening  07/08/2024   Pneumonia Vaccine 18+ Years old  Completed   Hepatitis C Screening  Completed   HPV VACCINES  Aged Out   Meningococcal B Vaccine  Aged Out   DEXA SCAN  Discontinued    Physical Exam: Vitals:   08/11/23 2127  BP: (!) 154/73  Pulse: 60  Resp: 20  Temp: 97.6 F (36.4 C)  Weight: 88 lb (39.9 kg)   Body mass index is 15.34 kg/m. Physical Exam Vitals reviewed.  Constitutional:      Appearance: Normal appearance.  HENT:     Head: Normocephalic.     Nose: Nose normal.     Mouth/Throat:     Mouth: Mucous membranes are moist.     Pharynx: Oropharynx is  clear.  Eyes:     Pupils: Pupils are equal, round, and reactive to light.  Cardiovascular:  Rate and Rhythm: Normal rate and regular rhythm.     Pulses: Normal pulses.     Heart sounds: Normal heart sounds. No murmur heard. Pulmonary:     Effort: Pulmonary effort is normal.     Breath sounds: Normal breath sounds.  Abdominal:     General: Abdomen is flat. Bowel sounds are normal.     Palpations: Abdomen is soft.  Musculoskeletal:        General: No swelling.     Cervical back: Neck supple.  Skin:    General: Skin is warm.  Neurological:     Mental Status: She is alert and oriented to person, place, and time.     Comments: Has Expressive Aphasia  Psychiatric:        Mood and Affect: Mood normal.        Thought Content: Thought content normal.     Labs reviewed: Basic Metabolic Panel: Recent Labs    03/18/23 0000 05/21/23 0000  NA 140 142  K 4.4 4.2  CL 106 107  CO2 22 25*  BUN 18 24*  CREATININE 0.8 1.0  CALCIUM  10.0 8.9   Liver Function Tests: Recent Labs    03/18/23 0000 05/21/23 0000  AST 27 30  ALT 23 35  ALKPHOS 96 93  ALBUMIN  4.2 3.4*   No results for input(s): "LIPASE", "AMYLASE" in the last 8760 hours. No results for input(s): "AMMONIA" in the last 8760 hours. CBC: Recent Labs    09/19/22 0000 03/18/23 0000 05/21/23 0000  WBC 7.4 4.0 7.2  HGB 14.0 12.5 13.6  HCT 43 38 41  PLT 246 180 246   Lipid Panel: Recent Labs    03/18/23 0000  CHOL 164  HDL 61  LDLCALC 67  TRIG 179*   Lab Results  Component Value Date   HGBA1C 6.0 (H) 11/13/2020    Procedures since last visit: No results found.  Assessment/Plan 1. Right hemiplegia (HCC) (Primary) Continues with therapy Botox  per Dr Gracie Lav  2. PAF (paroxysmal atrial fibrillation) (HCC) Eliquis  and Amiodarone   3. Pneumonia of right lower lobe due to infectious organism Repeat CT pending to see if it has resolved  4. Pulmonary emphysema, unspecified emphysema type (HCC) Oxygen   Chronic Stiolto  5. Essential hypertension Norvasc   6. Mixed hyperlipidemia Zetia  LDL 67 in 12/24  7. Slow transit constipation Miralax   8 Moderate episode of recurrent major depressive disorder (HCC) Wellbutrin    9 Gastroesophageal reflux disease without esophagitis Protonix       Labs/tests ordered:  * No order type specified * Next appt:  Visit date not found

## 2023-08-14 DIAGNOSIS — I69328 Other speech and language deficits following cerebral infarction: Secondary | ICD-10-CM | POA: Diagnosis not present

## 2023-08-14 DIAGNOSIS — R482 Apraxia: Secondary | ICD-10-CM | POA: Diagnosis not present

## 2023-08-14 DIAGNOSIS — I69322 Dysarthria following cerebral infarction: Secondary | ICD-10-CM | POA: Diagnosis not present

## 2023-08-18 ENCOUNTER — Encounter: Payer: Self-pay | Admitting: Adult Health

## 2023-08-18 ENCOUNTER — Non-Acute Institutional Stay: Payer: Self-pay | Admitting: Adult Health

## 2023-08-18 ENCOUNTER — Ambulatory Visit: Payer: Medicare Other | Attending: Cardiology | Admitting: Cardiology

## 2023-08-18 VITALS — BP 126/73 | HR 63 | Ht 63.0 in | Wt 88.0 lb

## 2023-08-18 DIAGNOSIS — I480 Paroxysmal atrial fibrillation: Secondary | ICD-10-CM

## 2023-08-18 DIAGNOSIS — I77819 Aortic ectasia, unspecified site: Secondary | ICD-10-CM | POA: Diagnosis not present

## 2023-08-18 DIAGNOSIS — I421 Obstructive hypertrophic cardiomyopathy: Secondary | ICD-10-CM

## 2023-08-18 NOTE — Progress Notes (Signed)
 Cardiology Office Note:  .   Date:  08/18/2023  ID:  [place], DOB May 08, 1945, MRN [id number] PCP: Marguerite Shiley, MD  [facility] Providers Cardiologist:  Dorothye Gathers, MD Electrophysiologist:  Richardo Chandler, MD     History of Present Illness: .   [place] is a 78 y.o. female Discussed the use of AI scribe software for clinical note transcription with the patient, who gave verbal consent to proceed.  History of Present Illness [place] "[place]" is a 78 year old female with hypertrophic obstructive cardiomyopathy and paroxysmal atrial fibrillation who presents for follow-up. She is accompanied by a caregiver who is actively involved in her care.  She is currently on amiodarone  100 mg daily, reduced from 200 mg in January 2025 due to intermittent sinus bradycardia. She feels fine and has not experienced any recent episodes of atrial fibrillation. Her blood pressure is stable, and there are no new concerns regarding her cardiomyopathy.  She had a recent issue with her oxygen  concentrator at [employer], where her oxygen  saturation dropped to 71%. This was resolved by switching to a new concentrator, and her saturation returned to normal levels. She is on steady flow oxygen  in her room and uses a pulsating type of oxygen  when needed.  She underwent evaluation for suspected pneumonia, with two CT scans and a consultation with a pulmonologist. The second CT scan showed a right lower lobe infiltrate, suspected to be resolving pneumonia or scar from prior aspiration.  Her ascending aorta is minimally or mildly dilated at 42 mm, which is stable compared to previous imaging.  No current breathing difficulties.     Studies Reviewed: .        Results RADIOLOGY Chest CT: Right lower lobe infiltrate, suspect resolving pneumonia or scar from prior aspiration (07/2023) Chest CT: Ascending aorta minimally or mildly dilated at 42 mm (07/2023) Risk Assessment/Calculations:             Physical Exam:   VS:  BP 126/73 (BP Location: Left Arm)   Pulse 63   Ht 5\' 3"  (1.6 m)   Wt 88 lb (39.9 kg)   LMP  (LMP Unknown)   SpO2 96%   BMI 15.59 kg/m    Wt Readings from Last 3 Encounters:  08/18/23 88 lb (39.9 kg)  08/18/23 88 lb (39.9 kg)  08/11/23 88 lb (39.9 kg)    GEN: Thin in wheelchair in no acute distress NECK: No JVD; No carotid bruits, [place] O2 CARDIAC: RRR, no murmurs, no rubs, no gallops RESPIRATORY:  Clear to auscultation without rales, wheezing or rhonchi  ABDOMEN: Soft, non-tender, non-distended EXTREMITIES:  No edema; No deformity, hemiparesis  ASSESSMENT AND PLAN: .    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  Paroxysmal Atrial Fibrillation Paroxysmal atrial fibrillation is well-controlled with amiodarone . The dose was reduced from 200 mg to 100 mg in January 2025 due to sinus bradycardia. The current dose effectively prevents atrial fibrillation episodes and maintains sinus rhythm without causing bradycardia. - Continue amiodarone  100 mg daily (TSH, ALT monitoring) - Schedule follow-up in 6 months  Sinus Bradycardia Sinus bradycardia prompted a reduction in amiodarone  dosage. Current heart rate is stable with no further bradycardia.  Oxygen  Therapy Management Hypoxemia occurred due to issues with the oxygen  concentrator, with oxygen  saturation dropping to 71%. Replacing the concentrator resolved the issue, and oxygen  levels normalized.   Pneumonia  Recent CT scans showed a right lower lobe infiltrate, suspected to be resolving pneumonia or scar from prior aspiration. Follow-up with pulmonologist is ongoing, with another appointment scheduled in June. The second CT scan was performed too close to the first, potentially affecting the assessment of medication efficacy.         Dispo: 6  month  Signed, Dorothye Gathers, MD

## 2023-08-18 NOTE — Patient Instructions (Signed)
 Medication Instructions:  The current medical regimen is effective;  continue present plan and medications.  *If you need a refill on your cardiac medications before your next appointment, please call your pharmacy*  Follow-Up: At [facility], you and your health needs are our priority.  As part of our continuing mission to provide you with exceptional heart care, our providers are all part of one team.  This team includes your primary Cardiologist (physician) and Advanced Practice Providers or APPs (Physician Assistants and Nurse Practitioners) who all work together to provide you with the care you need, when you need it.  Your next appointment:   6 month(s)  Provider:   Dorothye Gathers, MD    We recommend signing up for the patient portal called "MyChart".  Sign up information is provided on this After Visit Summary.  MyChart is used to connect with patients for Virtual Visits (Telemedicine).  Patients are able to view lab/test results, encounter notes, upcoming appointments, etc.  Non-urgent messages can be sent to your provider as well.   To learn more about what you can do with MyChart, go to [email or web address].

## 2023-08-18 NOTE — Progress Notes (Unsigned)
 Location:  [facility] Room Number: 116 A Place of Service:  SNF [phone]) Provider: Raylene Calamity, NP   Patient Care Team: Marguerite Shiley, MD as PCP - General (Internal Medicine) Hugh Madura, MD as PCP - Cardiology (Cardiology) Verona Goodwill, MD as PCP - Electrophysiology (Cardiology) Yvonna Herder, MD as Consulting Physician (Neurosurgery)  Extended Emergency Contact Information Primary Emergency Contact: Chokshi,[name] Address: [address]          [place] [phone] [place] Home Phone: [phone] Mobile Phone: [phone] Relation: Spouse Secondary Emergency Contact: Cardon,[name] Address: [address]          [place], [place] [address] [place] Mobile Phone: [phone] Relation: Son  Code Status: Full Code Goals of care: Advanced Directive information    06/17/2023   10:31 AM  Advanced Directives  Does Patient Have a Medical Advance Directive? Yes  Type of Estate agent of [place];Living will  Does patient want to make changes to medical advance directive? No - Patient declined  Copy of Healthcare Power of Attorney in Chart? Yes - validated most recent copy scanned in chart (See row information)     Chief Complaint  Patient presents with   Acute Visit    Hypoxia and constipation    HPI:  Pt is a 78 y.o. female seen today for an acute visit for    Past Medical History:  Diagnosis Date   Anxiety    Arthritis    "some in my lower back; probably elbows, knees" (11/18/2017)   Atrial fibrillation (HCC)    Bell's palsy    when pt. was 78 yrs old, when under stress the left side of face will droop.   Complication of anesthesia    "vascular OR 2016; BP bottomed out; couldn't get it regulated; ended up in ICU for DAYS" (11/18/2017)   GERD (gastroesophageal reflux disease)    History of kidney stones    Hypertension    Hypertrophic cardiomyopathy (HCC)     severe LV basilar hypertrophy witn no evidence of significant outflow tract obstruction, EF 65-70%, mild LAE, mild TR, grade 1a diastolic dysfunction 05/15/10 (Dr. Dorothye Gathers) (Atrial Septal Hypertrophy pattern)-- Intra-op TEE with dsignificant outflow tract obstruction - AI, MR & TR   Insomnia    Mild aortic sclerosis    Osteopenia    Peripheral vascular disease (HCC)    Syncope    , Vagal   Past Surgical History:  Procedure Laterality Date   AUGMENTATION MAMMAPLASTY Bilateral    BACK SURGERY     CARDIAC CATHETERIZATION N/A 05/07/2016   Procedure: Left Heart Cath and Coronary Angiography;  Surgeon: Odie Benne, MD;  Location: [facility] INVASIVE CV LAB;  Service: Cardiovascular;  Laterality: N/A;   CARDIOVERSION N/A 09/24/2017   Procedure: CARDIOVERSION;  Surgeon: Darlis Eisenmenger, MD;  Location[facility] ENDOSCOPY;  Service: Cardiovascular;  Laterality: N/A;   DILATION AND CURETTAGE OF UTERUS     ENDARTERECTOMY FEMORAL Right 03/02/2015   Procedure: ENDARTERECTOMY RIGHT FEMORAL;  Surgeon: Dannis Dy, MD;  Location: [facility] OR;  Service: Vascular;  Laterality: Right;   ESOPHAGOGASTRODUODENOSCOPY (EGD) WITH PROPOFOL  N/A 12/01/2020   Procedure: ESOPHAGOGASTRODUODENOSCOPY (EGD) WITH PROPOFOL ;  Surgeon: Anda Bamberg, MD;  Location: [facility] ENDOSCOPY;  Service: General;  Laterality: N/A;   FACIAL COSMETIC SURGERY Left 2002   "related to Bell's Palsy @ age 78; left eye/side of face droopy; tried to make area symmetrical"   FEMORAL-POPLITEAL  BYPASS GRAFT Right 03/02/2015   Procedure: BYPASS GRAFT FEMORAL-BELOW KNEE POPLITEAL ARTERY;  Surgeon: Dannis Dy, MD;  Location: [facility] OR;  Service: Vascular;  Laterality: Right;   INGUINAL HERNIA REPAIR Bilateral 2002   IR ANGIO INTRA EXTRACRAN SEL COM CAROTID INNOMINATE UNI L MOD SED  11/16/2020   IR CT HEAD LTD  11/13/2020   IR PERCUTANEOUS ART THROMBECTOMY/INFUSION INTRACRANIAL INC DIAG ANGIO  11/13/2020   OVARIAN CYST REMOVAL Left    PEG  PLACEMENT N/A 12/01/2020   Procedure: PERCUTANEOUS ENDOSCOPIC GASTROSTOMY (PEG) PLACEMENT;  Surgeon: Anda Bamberg, MD;  Location: [facility] ENDOSCOPY;  Service: General;  Laterality: N/A;   PERIPHERAL VASCULAR CATHETERIZATION N/A 01/16/2015   Procedure: Abdominal Aortogram;  Surgeon: Dannis Dy, MD;  Location: [facility] INVASIVE CV LAB;  Service: Cardiovascular;  Laterality: N/A;   POSTERIOR LUMBAR FUSION  2015   "have plates and screws in there"   RADIOLOGY WITH ANESTHESIA N/A 11/13/2020   Procedure: IR WITH ANESTHESIA;  Surgeon: Radiologist, Medication, MD;  Location: [facility] OR;  Service: Radiology;  Laterality: N/A;   TONSILLECTOMY      Allergies  Allergen Reactions   Amoxicillin Other (See Comments)    UTI Has patient had a PCN reaction causing immediate rash, facial/tongue/throat swelling, SOB or lightheadedness with hypotension: No Has patient had a PCN reaction causing severe rash involving mucus membranes or skin necrosis: No Has patient had a PCN reaction that required hospitalization: No Has patient had a PCN reaction occurring within the last 10 years: Yes--UTI ONLY If all of the above answers are "NO", then may proceed with Cephalosporin use.    Atenolol Cough   Crestor  [Rosuvastatin  Calcium ] Other (See Comments)    Muscle aches   Pravastatin  Other (See Comments)    Muscle aches   Rosuvastatin  Calcium     Sulfa Antibiotics Nausea Only   Codeine Other (See Comments)    hallucinations    Outpatient Encounter Medications as of 08/18/2023  Medication Sig   acetaminophen  (TYLENOL ) 325 MG tablet Take 650 mg by mouth every 6 (six) hours as needed for mild pain or fever.   albuterol  (VENTOLIN  HFA) 108 (90 Base) MCG/ACT inhaler Inhale 2 puffs into the lungs every 6 (six) hours as needed for wheezing or shortness of breath.   amiodarone  (PACERONE ) 200 MG tablet Take 100 mg by mouth daily. May give crushed   amLODipine  (NORVASC ) 5 MG tablet Take 2.5 mg by mouth daily.   apixaban   (ELIQUIS ) 5 MG TABS tablet Take 5 mg by mouth 2 (two) times daily. May give crushed   Benzonatate  (TESSALON  PERLES PO) Take 200 mg by mouth as needed. 200mg , oral as needed, Tessalon  Perles, 200mg , PO TID PRN cough   bisacodyl  (DULCOLAX) 5 MG EC tablet Take 5 mg by mouth 2 (two) times daily as needed for moderate constipation.   buPROPion  (WELLBUTRIN  XL) 150 MG 24 hr tablet Take 150 mg by mouth daily.   diclofenac Sodium (VOLTAREN) 1 % GEL Apply one application to right hip and knee four times daily as needed.   Emollient (CERAVE) CREA Apply 1 Application topically as needed.   Eyelid Cleansers (OCUSOFT EYELID CLEANSING) PADS Apply 1 Application topically daily.   ezetimibe  (ZETIA ) 10 MG tablet Take 10 mg by mouth daily. Oral if crushed   furosemide  (LASIX ) 20 MG tablet Take 20 mg by mouth daily as needed. Give Lasix  20mg  for weight gain equal or greater than 3 pounds   hydrALAZINE  (APRESOLINE ) 10 MG tablet Take 10 mg by  mouth 3 (three) times daily as needed (SBP > 165 or DBP > 95).   hydrocortisone cream 1 % Apply 1 Application topically 2 (two) times daily as needed for itching.   ipratropium-albuterol  (DUONEB) 0.5-2.5 (3) MG/3ML SOLN Inhale 3 mLs into the lungs every 4 (four) hours as needed.   ketoconazole  (NIZORAL ) 2 % cream Apply 1 application  topically daily as needed for irritation.   ondansetron  (ZOFRAN ) 4 MG tablet Take 4 mg by mouth every 6 (six) hours as needed for nausea or vomiting.   pantoprazole  (PROTONIX ) 40 MG tablet Take 40 mg by mouth 2 (two) times daily.   Polyethyl Glycol-Propyl Glycol (SYSTANE) 0.4-0.3 % SOLN Apply 2 drops to eye every 6 (six) hours as needed (dry eyes).   polyethylene glycol powder (GLYCOLAX /MIRALAX ) 17 GM/SCOOP powder Take 17 g by mouth every other day.   sennosides-docusate sodium  (SENOKOT-S) 8.6-50 MG tablet Take 2 tablets by mouth 2 (two) times daily.   Tiotropium Bromide-Olodaterol (STIOLTO RESPIMAT ) 2.5-2.5 MCG/ACT AERS Inhale 2 puffs into the lungs  daily.   menthol -cetylpyridinium (CEPACOL) 3 MG lozenge Take 1 lozenge (3 mg total) by mouth every 4 (four) hours as needed for sore throat. (Patient not taking: Reported on 08/18/2023)   nystatin cream (MYCOSTATIN) Apply 1 application topically 2 (two) times daily as needed for dry skin (apply to perirectal rash). (Patient not taking: Reported on 08/18/2023)   OXYGEN  Inhale 3 L into the lungs as directed. to maintain sats > or equal to 90%. May titrate Three Times A Day; 07:00 AM - 03:00 PM, 03:00 PM - 11:00 PM, 11:00 PM - 7:00 am (Patient not taking: Reported on 08/18/2023)   zinc oxide 20 % ointment Apply 1 Application topically 2 (two) times daily as needed for irritation. apply to peri rectal area BID PRN for rash-use with Nystatin (Patient not taking: Reported on 08/18/2023)   Facility-Administered Encounter Medications as of 08/18/2023  Medication   botulinum toxin Type A  (BOTOX ) injection 100 Units    Review of Systems  Immunization History  Administered Date(s) Administered   Fluad Quad(high Dose 65+) 02/05/2022, 01/08/2023   Fluzone Influenza virus vaccine,trivalent (IIV3), split virus 04/30/2010, 01/26/2016, 02/07/2018, 02/01/2020   Influenza Split 01/16/2009   Influenza, High Dose Seasonal PF 02/07/2018, 01/25/2019, 02/28/2021, 02/05/2022   Influenza,inj,Quad PF,6+ Mos 03/06/2012, 01/26/2016, 01/10/2017   Influenza,inj,quad, With Preservative 02/14/2015   Moderna Covid-19 Vaccine Bivalent Booster 78yrs & up 02/28/2021, 02/04/2023   PFIZER(Purple Top)SARS-COV-2 Vaccination 05/06/2019, 05/27/2019, 01/27/2020, 09/18/2020   Pneumococcal Conjugate-13 03/19/2017   Pneumococcal Polysaccharide-23 02/14/2015   Respiratory Syncytial Virus Vaccine,Recomb Aduvanted(Arexvy) 02/15/2022   Td 02/25/2005   Tdap 05/19/2013   Zoster, Live 07/28/2006   Pertinent  Health Maintenance Due  Topic Date Due   INFLUENZA VACCINE  11/14/2023   DEXA SCAN  Discontinued      05/07/2022   11:59 AM 08/13/2022    11:02 AM 11/26/2022   12:34 PM 12/31/2022    3:26 PM 03/11/2023    2:48 PM  Fall Risk  Falls in the past year? 0 1 0 0 1  Was there an injury with Fall? 0 0 0 0 0  Fall Risk Category Calculator 0 1 0 0 1  Patient at Risk for Falls Due to No Fall Risks History of fall(s);Impaired balance/gait;Impaired mobility History of fall(s);Impaired balance/gait History of fall(s);Impaired balance/gait;Impaired mobility History of fall(s);Impaired balance/gait;Impaired mobility  Patient at Risk for Falls Due to - Comments  right sided hemiparesis     Fall risk Follow up  Falls evaluation completed Falls evaluation completed;Education provided;Falls prevention discussed Falls evaluation completed;Education provided Falls evaluation completed;Education provided;Falls prevention discussed Falls evaluation completed;Education provided;Falls prevention discussed   Functional Status Survey:    Vitals:   08/18/23 0936  BP: 124/64  Pulse: 65  Resp: 16  Temp: (!) 97.2 F (36.2 C)  SpO2: 94%  Weight: 88 lb (39.9 kg)  Height: 5' 3.5" (1.613 m)   Body mass index is 15.34 kg/m. Physical Exam  Labs reviewed: Recent Labs    03/18/23 0000 05/21/23 0000  NA 140 142  K 4.4 4.2  CL 106 107  CO2 22 25*  BUN 18 24*  CREATININE 0.8 1.0  CALCIUM  10.0 8.9   Recent Labs    03/18/23 0000 05/21/23 0000 06/24/23 0000  AST 27 30 30   ALT 23 35 30  ALKPHOS 96 93 95  ALBUMIN  4.2 3.4* 3.0*   Recent Labs    09/19/22 0000 03/18/23 0000 05/21/23 0000  WBC 7.4 4.0 7.2  HGB 14.0 12.5 13.6  HCT 43 38 41  PLT 246 180 246   Lab Results  Component Value Date   TSH 0.61 08/08/2022   Lab Results  Component Value Date   HGBA1C 6.0 (H) 11/13/2020   Lab Results  Component Value Date   CHOL 164 03/18/2023   HDL 61 03/18/2023   LDLCALC 67 03/18/2023   TRIG 179 (A) 03/18/2023   CHOLHDL 3.0 11/13/2020    Significant Diagnostic Results in last 30 days:  No results found.  Assessment/Plan There are no  diagnoses linked to this encounter.   Family/ staff Communication: ***  Labs/tests ordered:  ***

## 2023-08-19 DIAGNOSIS — R482 Apraxia: Secondary | ICD-10-CM | POA: Diagnosis not present

## 2023-08-19 DIAGNOSIS — I69322 Dysarthria following cerebral infarction: Secondary | ICD-10-CM | POA: Diagnosis not present

## 2023-08-19 DIAGNOSIS — I69328 Other speech and language deficits following cerebral infarction: Secondary | ICD-10-CM | POA: Diagnosis not present

## 2023-08-19 NOTE — Progress Notes (Signed)
 This encounter was created in error - please disregard.

## 2023-08-20 ENCOUNTER — Ambulatory Visit: Payer: Medicare Other | Admitting: Neurology

## 2023-08-26 ENCOUNTER — Ambulatory Visit: Payer: Medicare Other | Admitting: Neurology

## 2023-08-26 ENCOUNTER — Telehealth: Payer: Self-pay | Admitting: Neurology

## 2023-08-26 NOTE — Telephone Encounter (Signed)
 Pt's husband called stating that they will not be able to come in for her BOTOX  appt today. They would like to r/s. Please advise.

## 2023-08-26 NOTE — Telephone Encounter (Signed)
 Returned call to pt's husband, r/s for 5/28 @ 3:20 pm.

## 2023-08-27 DIAGNOSIS — I6602 Occlusion and stenosis of left middle cerebral artery: Secondary | ICD-10-CM | POA: Diagnosis not present

## 2023-08-27 DIAGNOSIS — M62421 Contracture of muscle, right upper arm: Secondary | ICD-10-CM | POA: Diagnosis not present

## 2023-08-27 DIAGNOSIS — I69351 Hemiplegia and hemiparesis following cerebral infarction affecting right dominant side: Secondary | ICD-10-CM | POA: Diagnosis not present

## 2023-08-29 DIAGNOSIS — I69351 Hemiplegia and hemiparesis following cerebral infarction affecting right dominant side: Secondary | ICD-10-CM | POA: Diagnosis not present

## 2023-08-29 DIAGNOSIS — M62421 Contracture of muscle, right upper arm: Secondary | ICD-10-CM | POA: Diagnosis not present

## 2023-08-29 DIAGNOSIS — I6602 Occlusion and stenosis of left middle cerebral artery: Secondary | ICD-10-CM | POA: Diagnosis not present

## 2023-09-01 DIAGNOSIS — I6602 Occlusion and stenosis of left middle cerebral artery: Secondary | ICD-10-CM | POA: Diagnosis not present

## 2023-09-01 DIAGNOSIS — I69351 Hemiplegia and hemiparesis following cerebral infarction affecting right dominant side: Secondary | ICD-10-CM | POA: Diagnosis not present

## 2023-09-01 DIAGNOSIS — M62421 Contracture of muscle, right upper arm: Secondary | ICD-10-CM | POA: Diagnosis not present

## 2023-09-02 DIAGNOSIS — I69328 Other speech and language deficits following cerebral infarction: Secondary | ICD-10-CM | POA: Diagnosis not present

## 2023-09-02 DIAGNOSIS — R482 Apraxia: Secondary | ICD-10-CM | POA: Diagnosis not present

## 2023-09-02 DIAGNOSIS — I69322 Dysarthria following cerebral infarction: Secondary | ICD-10-CM | POA: Diagnosis not present

## 2023-09-03 DIAGNOSIS — I69351 Hemiplegia and hemiparesis following cerebral infarction affecting right dominant side: Secondary | ICD-10-CM | POA: Diagnosis not present

## 2023-09-03 DIAGNOSIS — M62421 Contracture of muscle, right upper arm: Secondary | ICD-10-CM | POA: Diagnosis not present

## 2023-09-03 DIAGNOSIS — I6602 Occlusion and stenosis of left middle cerebral artery: Secondary | ICD-10-CM | POA: Diagnosis not present

## 2023-09-05 DIAGNOSIS — I69351 Hemiplegia and hemiparesis following cerebral infarction affecting right dominant side: Secondary | ICD-10-CM | POA: Diagnosis not present

## 2023-09-05 DIAGNOSIS — M62421 Contracture of muscle, right upper arm: Secondary | ICD-10-CM | POA: Diagnosis not present

## 2023-09-05 DIAGNOSIS — I6602 Occlusion and stenosis of left middle cerebral artery: Secondary | ICD-10-CM | POA: Diagnosis not present

## 2023-09-08 DIAGNOSIS — R482 Apraxia: Secondary | ICD-10-CM | POA: Diagnosis not present

## 2023-09-08 DIAGNOSIS — I69328 Other speech and language deficits following cerebral infarction: Secondary | ICD-10-CM | POA: Diagnosis not present

## 2023-09-08 DIAGNOSIS — I69322 Dysarthria following cerebral infarction: Secondary | ICD-10-CM | POA: Diagnosis not present

## 2023-09-10 ENCOUNTER — Ambulatory Visit: Admitting: Neurology

## 2023-09-10 ENCOUNTER — Telehealth: Payer: Self-pay | Admitting: Neurology

## 2023-09-10 ENCOUNTER — Encounter: Payer: Self-pay | Admitting: Neurology

## 2023-09-10 NOTE — Telephone Encounter (Signed)
 Pt husband called to reschedule appt

## 2023-09-15 DIAGNOSIS — I6602 Occlusion and stenosis of left middle cerebral artery: Secondary | ICD-10-CM | POA: Diagnosis not present

## 2023-09-15 DIAGNOSIS — I69351 Hemiplegia and hemiparesis following cerebral infarction affecting right dominant side: Secondary | ICD-10-CM | POA: Diagnosis not present

## 2023-09-15 DIAGNOSIS — M62421 Contracture of muscle, right upper arm: Secondary | ICD-10-CM | POA: Diagnosis not present

## 2023-09-16 DIAGNOSIS — I69328 Other speech and language deficits following cerebral infarction: Secondary | ICD-10-CM | POA: Diagnosis not present

## 2023-09-16 DIAGNOSIS — I69322 Dysarthria following cerebral infarction: Secondary | ICD-10-CM | POA: Diagnosis not present

## 2023-09-16 DIAGNOSIS — R482 Apraxia: Secondary | ICD-10-CM | POA: Diagnosis not present

## 2023-09-22 ENCOUNTER — Ambulatory Visit
Admission: RE | Admit: 2023-09-22 | Discharge: 2023-09-22 | Disposition: A | Discharge status: Home / Self Care | Source: Ambulatory Visit | Attending: Emergency Medicine | Admitting: Emergency Medicine

## 2023-09-22 DIAGNOSIS — R911 Solitary pulmonary nodule: Secondary | ICD-10-CM | POA: Diagnosis not present

## 2023-09-22 DIAGNOSIS — I6602 Occlusion and stenosis of left middle cerebral artery: Secondary | ICD-10-CM | POA: Diagnosis not present

## 2023-09-22 DIAGNOSIS — J479 Bronchiectasis, uncomplicated: Secondary | ICD-10-CM | POA: Diagnosis not present

## 2023-09-22 DIAGNOSIS — M62421 Contracture of muscle, right upper arm: Secondary | ICD-10-CM | POA: Diagnosis not present

## 2023-09-22 DIAGNOSIS — R918 Other nonspecific abnormal finding of lung field: Secondary | ICD-10-CM | POA: Diagnosis not present

## 2023-09-22 DIAGNOSIS — R599 Enlarged lymph nodes, unspecified: Secondary | ICD-10-CM | POA: Diagnosis not present

## 2023-09-22 DIAGNOSIS — I69351 Hemiplegia and hemiparesis following cerebral infarction affecting right dominant side: Secondary | ICD-10-CM | POA: Diagnosis not present

## 2023-09-22 DIAGNOSIS — R9389 Abnormal findings on diagnostic imaging of other specified body structures: Secondary | ICD-10-CM

## 2023-09-23 DIAGNOSIS — R482 Apraxia: Secondary | ICD-10-CM | POA: Diagnosis not present

## 2023-09-23 DIAGNOSIS — I69328 Other speech and language deficits following cerebral infarction: Secondary | ICD-10-CM | POA: Diagnosis not present

## 2023-09-23 DIAGNOSIS — I69322 Dysarthria following cerebral infarction: Secondary | ICD-10-CM | POA: Diagnosis not present

## 2023-09-24 DIAGNOSIS — I6602 Occlusion and stenosis of left middle cerebral artery: Secondary | ICD-10-CM | POA: Diagnosis not present

## 2023-09-24 DIAGNOSIS — I69351 Hemiplegia and hemiparesis following cerebral infarction affecting right dominant side: Secondary | ICD-10-CM | POA: Diagnosis not present

## 2023-09-24 DIAGNOSIS — M62421 Contracture of muscle, right upper arm: Secondary | ICD-10-CM | POA: Diagnosis not present

## 2023-09-26 DIAGNOSIS — I69322 Dysarthria following cerebral infarction: Secondary | ICD-10-CM | POA: Diagnosis not present

## 2023-09-26 DIAGNOSIS — I69351 Hemiplegia and hemiparesis following cerebral infarction affecting right dominant side: Secondary | ICD-10-CM | POA: Diagnosis not present

## 2023-09-26 DIAGNOSIS — I6602 Occlusion and stenosis of left middle cerebral artery: Secondary | ICD-10-CM | POA: Diagnosis not present

## 2023-09-26 DIAGNOSIS — I69328 Other speech and language deficits following cerebral infarction: Secondary | ICD-10-CM | POA: Diagnosis not present

## 2023-09-26 DIAGNOSIS — M62421 Contracture of muscle, right upper arm: Secondary | ICD-10-CM | POA: Diagnosis not present

## 2023-09-26 DIAGNOSIS — R482 Apraxia: Secondary | ICD-10-CM | POA: Diagnosis not present

## 2023-09-30 ENCOUNTER — Telehealth: Payer: Self-pay

## 2023-09-30 NOTE — Telephone Encounter (Signed)
 Copied from CRM [id number]. Topic: Appointments - Scheduling Inquiry for Clinic >> Sep 29, 2023 11:56 AM Hilton Lucky wrote: Reason for CRM: Patient is calling to reschedule appointment for 06/18 with Dr. Baldwin Levee to review CT results. Patient is unable to be r/s with this provider at this time and declines to be r/s with APP. Patient would like to be worked in or called to discuss those results. >> Sep 29, 2023  3:56 PM CMA Buddie Carina P wrote: The front staff schedules appointments. Please follow protocols if you are unable to schedule.    Pt has been rescheduled and is aware of appt 6/18 to review results. Nfn

## 2023-10-01 ENCOUNTER — Ambulatory Visit: Admitting: Emergency Medicine

## 2023-10-01 ENCOUNTER — Ambulatory Visit: Admitting: Neurology

## 2023-10-01 ENCOUNTER — Encounter: Payer: Self-pay | Admitting: Emergency Medicine

## 2023-10-01 VITALS — BP 112/87

## 2023-10-01 VITALS — BP 149/70 | HR 60 | Ht 64.0 in

## 2023-10-01 DIAGNOSIS — G8191 Hemiplegia, unspecified affecting right dominant side: Secondary | ICD-10-CM

## 2023-10-01 DIAGNOSIS — I6521 Occlusion and stenosis of right carotid artery: Secondary | ICD-10-CM

## 2023-10-01 DIAGNOSIS — I6522 Occlusion and stenosis of left carotid artery: Secondary | ICD-10-CM

## 2023-10-01 DIAGNOSIS — J439 Emphysema, unspecified: Secondary | ICD-10-CM

## 2023-10-01 DIAGNOSIS — G8111 Spastic hemiplegia affecting right dominant side: Secondary | ICD-10-CM

## 2023-10-01 DIAGNOSIS — R0902 Hypoxemia: Secondary | ICD-10-CM

## 2023-10-01 DIAGNOSIS — R9389 Abnormal findings on diagnostic imaging of other specified body structures: Secondary | ICD-10-CM | POA: Diagnosis not present

## 2023-10-01 DIAGNOSIS — I63132 Cerebral infarction due to embolism of left carotid artery: Secondary | ICD-10-CM

## 2023-10-01 MED ORDER — ONABOTULINUMTOXINA 200 UNITS IJ SOLR
200.0000 [IU] | Freq: Once | INTRAMUSCULAR | Status: AC
  Administered 2023-10-01: 200 [IU] via INTRAMUSCULAR

## 2023-10-01 NOTE — Progress Notes (Unsigned)
   Subjective:    Patient ID: Beverly Li, female    DOB: 08-28-1945, 78 y.o.   MRN: [id number]  HPI  ROV 10/01/2023 --78 year old woman with severe COPD and chronic hypoxemic respiratory failure.  She had a right lower lobe pneumonia in March 2025. PMH: Atrial fibrillation, HOCM, PVD, CVA with associated dysphagia, aortic dilation She has been experiencing some increased dyspnea when she lays down to go to bed, fairly longstanding. She has some intermittent aspiration and cough depending on her food. Not really dealing with a lot of mucous. Managed on Stiolto, rare albuterol  use.   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  Heterogeneous 1.9 cm nodule of the left lobe of the thyroid  (nonemergent ultrasound recommended).  Severe panlobular emphysema that is upper lobe predominant right lower lobe volume loss with associated bronchiectasis, suspect progressive scarring, slowly more notable going back to 2022.  Posterior right lower lobe ovoid opacity 2.2 x 1.2 cm that is stable compared with March 2025.  5 mm posterior left lower lobe nodule unchanged over multiple scans.   Review of Systems  Constitutional:  Negative for fever and unexpected weight change.  HENT:  Negative for congestion, dental problem, ear pain, nosebleeds, postnasal drip, rhinorrhea, sinus pressure, sneezing, sore throat and trouble swallowing.   Eyes:  Negative for redness and itching.  Respiratory:  Positive for chest tightness and shortness of breath. Negative for cough and wheezing.   Cardiovascular:  Negative for palpitations and leg swelling.  Gastrointestinal:  Negative for nausea and vomiting.  Genitourinary:  Negative for dysuria.  Musculoskeletal:  Negative for joint swelling.  Skin:  Negative for rash.  Neurological:  Negative for headaches.  Hematological:  Does not bruise/bleed easily.   Psychiatric/Behavioral:  Negative for dysphoric mood. The patient is not nervous/anxious.        Objective:   Physical Exam Vitals:   10/01/23 1131 10/01/23 1133  BP: (!) 146/73 (!) 149/70  Pulse: 60   SpO2: 95%   Height: 5' 4 (1.626 m)      Gen: Pleasant, thin woman, in no distress,  normal affect  ENT: No lesions,  mouth clear,  oropharynx clear, no postnasal drip  Neck: No JVD, no stridor  Lungs: No use of accessory muscles, clear on normal respiration.  Crackles at both bases, no wheezing  Cardiovascular: RRR, heart sounds normal, no murmur or gallops, no peripheral edema  Musculoskeletal: No deformities, no cyanosis or clubbing  Neuro: awake, some dysarthria, subtle facial droop.   Skin: Warm, no lesions or rashes      Assessment & Plan:  No problem-specific Assessment & Plan notes found for this encounter.     Racheal Buddle, MD, PhD 10/01/2023, 11:39 AM [facility] [phone] or if no answer [phone]

## 2023-10-01 NOTE — Progress Notes (Signed)
 Botox - 200 units x 1 vial Lot: [id number] Expiration: 02/2026 NDC: 2956-2130-86  Bacteriostatic 0.9% Sodium Chloride - 4 mL  Lot: [id number] Expiration: 10/12/24 NDC: 6295-2841-32  Dx: G81.91  B/B Witnessed by Carmon Christen cma

## 2023-10-01 NOTE — Patient Instructions (Signed)
 We reviewed your CT scan of the chest today. Will plan to repeat your CT chest in 6 months, December 2025. Please continue your Stiolto 2 puffs once daily. Keep your albuterol  available to use 2 puffs when needed for shortness of breath, chest tightness, wheezing. We talked today about possibly doing a trial on a new nebulized COPD medication called Ohtuvayre .  We can revisit this going forward. Continue your oxygen  as you have been using it Follow Dr. Baldwin Levee in December after your CT chest so we can review those results together.

## 2023-10-01 NOTE — Progress Notes (Signed)
 Chief Complaint  Patient presents with   Injections    RM15, HUSBAND PRESENT, PT IS WELL AND READY FOR INJECTION      ASSESSMENT AND PLAN  Beverly Li is a 78 y.o. 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  Stroke happened while she was off Eliquis  for 1 week for abdominal hematoma, with history of atrial fibrillation  Now with residual aphasia, right hemiparesis,  Electrical stimulation guided Botox  a injection for spastic right upper extremity (100 units of Botox  a dissolving to 2 cc of normal saline), 1st injection was on May 22 2022  Right pectoralis major 50 units Right latissimus dorsi 50 units Right flexor digitorum profundus 25  units Right pronator teres 25 units Right biceps 25 units  units  Right brachialis 25 units   DIAGNOSTIC DATA (LABS, IMAGING, TESTING) - I reviewed patient records, labs, notes, testing and imaging myself where available.   MEDICAL HISTORY:  Beverly Li is a 78 year old female, accompanied by her husband, seen in request by his primary care at [facility] Dr. Joanell Mowers for evaluation of botulism toxin injection for spastic right hemiparesis, she is also seen by our clinic for stroke follow-up   I reviewed and summarized the referring note.PMHx. A fib,  Eliquis  HTN Peripheral vascular disease. COPD   She suffered stroke on November 13, 2020 presenting with right-sided weakness, right facial droop, left gaze preference,  MRI showed left ACA/MCA stroke, status post IR with TICI, reperfusion of left MCA, in the setting of paroxysmal atrial fibrillation taking off Eliquis  for 1 week due to abdominal hematoma  CT angiogram head and neck and four-vessel angiogram postprocedure showed status post revascularization of the occluded left middle cerebral artery M1 segment, and proximal flow arrest achieved TICI 3 revascularization, severe preocclusive stenosis of the proximal right internal carotid artery associated with a delayed  strain signs,  Known history of peripheral vascular disease, CT angiogram November 25, 2020 suspicious for occlusion of distal right femoral-popliteal bypass, low cardiac output, and distal high-grade stenosis/resistance also contributed to the unopacified distal bypass conduit, Absent of right side tibial arterial opacification, moderate to advanced aortic atherosclerosis bilateral iliac arterial disease, with no evidence of high-grade aortoiliac disease.  Echocardiogram in March 2023, ejection fraction 60 to 65%, no regional wall motion abnormality, moderate asymmetric left ventricular hypertrophy of the basal septal segment  She is now at [facility], with right hemiparesis, aphasia, husband reported that she continued to make small progress even now, physical therapy have suggested botulism toxin injection to alleviate her right shoulder and arm weakness   UPDATE May 22 2022: She is accompanied by her husband, receiving first electrical stimulation guided Botox  injection for spastic right upper extremity, use of Botox  a 100 units x 2, potential side effect explained,  UPDATE Aug 21 2022: She responded well to previous injection, there was no significant side effect noticed, tolerating Botox  a 100 units x 2,  UPDATE August 14th 2024: She is accompanied by her husband at today's clinical visit, overall doing well, no side effect noted, every 3 months Botox  injection has relaxed right shoulder, elbow,  UPDATE Feb 26 2023: Previous injection has been helpful, no side effect noted.  UPDATE May 26 2023: She tolerated the injection well, has better range of motion of right arm  UPDATE October 01 2023: Repeat injection every few months did help losing her right arm and shoulder has better range of motion  PHYSICAL EXAM:  10/01/2023    2:51 PM 10/01/2023   11:33 AM 10/01/2023   11:31 AM  Vitals with BMI  Height   5' 4  Systolic 112 149 284  Diastolic 87 70 73  Pulse   60      PHYSICAL EXAMNIATION:  Gen: NAD, conversant, well nourised, well groomed                     Cardiovascular: Regular rate rhythm, no peripheral edema, warm, nontender. Eyes: Conjunctivae clear without exudates or hemorrhage Neck: Supple, no carotid bruits. Pulmonary: Clear to auscultation bilaterally   NEUROLOGICAL EXAM:  MENTAL STATUS: Speech/cognition: Global aphasia, paraphasic errors, slow in comprehension, word finding difficulties CRANIAL NERVES: CN II: Visual fields are full to confrontation. Pupils are round equal and briskly reactive to light. CN III, IV, VI: extraocular movement are normal. No ptosis. CN V: Facial sensation is intact to light touch CN VII: Right lower face weakness CN VIII: Hearing is normal to causal conversation. CN IX, X: Phonation is normal. CN XI: Head turning and shoulder shrug are intact  MOTOR: Right hemiparesis, mild spasticity of right upper extremity, no  right upper extremity antigravity movement, good range of motion of right shoulder, elbow, tight right pectoralis muscle, latissimus dorsi, tendency for right thumb in  REVIEW OF SYSTEMS:  Full 14 system review of systems performed and notable only for as above All other review of systems were negative.   ALLERGIES: Allergies  Allergen Reactions   Amoxicillin Other (See Comments)    UTI Has patient had a PCN reaction causing immediate rash, facial/tongue/throat swelling, SOB or lightheadedness with hypotension: No Has patient had a PCN reaction causing severe rash involving mucus membranes or skin necrosis: No Has patient had a PCN reaction that required hospitalization: No Has patient had a PCN reaction occurring within the last 10 years: Yes--UTI ONLY If all of the above answers are NO, then may proceed with Cephalosporin use.    Atenolol Cough   Crestor  [Rosuvastatin  Calcium ] Other (See Comments)    Muscle aches   Pravastatin  Other (See Comments)    Muscle aches    Rosuvastatin  Calcium     Sulfa Antibiotics Nausea Only   Codeine Other (See Comments)    hallucinations    HOME MEDICATIONS: Current Outpatient Medications  Medication Sig Dispense Refill   acetaminophen  (TYLENOL ) 325 MG tablet Take 650 mg by mouth every 6 (six) hours as needed for mild pain or fever.     albuterol  (VENTOLIN  HFA) 108 (90 Base) MCG/ACT inhaler Inhale 2 puffs into the lungs every 6 (six) hours as needed for wheezing or shortness of breath. 8 g 6   amiodarone  (PACERONE ) 200 MG tablet Take 100 mg by mouth daily. May give crushed     amLODipine  (NORVASC ) 5 MG tablet Take 2.5 mg by mouth daily.     apixaban  (ELIQUIS ) 5 MG TABS tablet Take 5 mg by mouth 2 (two) times daily. May give crushed     Benzonatate  (TESSALON  PERLES PO) Take 200 mg by mouth as needed. 200mg , oral as needed, Tessalon  Perles, 200mg , PO TID PRN cough     bisacodyl  (DULCOLAX) 5 MG EC tablet Take 5 mg by mouth 2 (two) times daily as needed for moderate constipation.     buPROPion  (WELLBUTRIN  XL) 150 MG 24 hr tablet Take 150 mg by mouth daily.     diclofenac Sodium (VOLTAREN) 1 % GEL Apply one application to right hip and knee four times daily as needed.  Emollient (CERAVE) CREA Apply 1 Application topically as needed.     Eyelid Cleansers (OCUSOFT EYELID CLEANSING) PADS Apply 1 Application topically daily.     ezetimibe  (ZETIA ) 10 MG tablet Take 10 mg by mouth daily. Oral if crushed     furosemide  (LASIX ) 20 MG tablet Take 20 mg by mouth daily as needed. Give Lasix  20mg  for weight gain equal or greater than 3 pounds     hydrALAZINE  (APRESOLINE ) 10 MG tablet Take 10 mg by mouth 3 (three) times daily as needed (SBP > 165 or DBP > 95).     hydrocortisone cream 1 % Apply 1 Application topically 2 (two) times daily as needed for itching.     ipratropium-albuterol  (DUONEB) 0.5-2.5 (3) MG/3ML SOLN Inhale 3 mLs into the lungs every 4 (four) hours as needed.     ketoconazole  (NIZORAL ) 2 % cream Apply 1 application   topically daily as needed for irritation.     nystatin cream (MYCOSTATIN) Apply 1 application  topically 2 (two) times daily as needed for dry skin (apply to perirectal rash).     ondansetron  (ZOFRAN ) 4 MG tablet Take 4 mg by mouth every 6 (six) hours as needed for nausea or vomiting.     OXYGEN  Inhale 3 L into the lungs as directed. to maintain sats > or equal to 90%. May titrate Three Times A Day; 07:00 AM - 03:00 PM, 03:00 PM - 11:00 PM, 11:00 PM - 7:00 am     pantoprazole  (PROTONIX ) 40 MG tablet Take 40 mg by mouth 2 (two) times daily.     Polyethyl Glycol-Propyl Glycol (SYSTANE) 0.4-0.3 % SOLN Apply 2 drops to eye every 6 (six) hours as needed (dry eyes).     polyethylene glycol powder (GLYCOLAX /MIRALAX ) 17 GM/SCOOP powder Take 17 g by mouth every other day.     sennosides-docusate sodium  (SENOKOT-S) 8.6-50 MG tablet Take 2 tablets by mouth 2 (two) times daily.     Tiotropium Bromide-Olodaterol (STIOLTO RESPIMAT ) 2.5-2.5 MCG/ACT AERS Inhale 2 puffs into the lungs daily.     Current Facility-Administered Medications  Medication Dose Route Frequency Provider Last Rate Last Admin   botulinum toxin Type A  (BOTOX ) injection 100 Units  100 Units Intramuscular Once Phebe Brasil, MD       botulinum toxin Type A  (BOTOX ) injection 200 Units  200 Units Intramuscular Once Phebe Brasil, MD        PAST MEDICAL HISTORY: Past Medical History:  Diagnosis Date   Anxiety    Arthritis    some in my lower back; probably elbows, knees (11/18/2017)   Atrial fibrillation (HCC)    Bell's palsy    when pt. was 78 yrs old, when under stress the left side of face will droop.   Complication of anesthesia    vascular OR 2016; BP bottomed out; couldn't get it regulated; ended up in ICU for DAYS (11/18/2017)   GERD (gastroesophageal reflux disease)    History of kidney stones    Hypertension    Hypertrophic cardiomyopathy (HCC)    severe LV basilar hypertrophy witn no evidence of significant outflow tract  obstruction, EF 65-70%, mild LAE, mild TR, grade 1a diastolic dysfunction 05/15/10 (Dr. Dorothye Gathers) (Atrial Septal Hypertrophy pattern)-- Intra-op TEE with dsignificant outflow tract obstruction - AI, MR & TR   Insomnia    Mild aortic sclerosis    Osteopenia    Peripheral vascular disease (HCC)    Syncope    , Vagal    PAST SURGICAL HISTORY: Past  Surgical History:  Procedure Laterality Date   AUGMENTATION MAMMAPLASTY Bilateral    BACK SURGERY     CARDIAC CATHETERIZATION N/A 05/07/2016   Procedure: Left Heart Cath and Coronary Angiography;  Surgeon: Odie Benne, MD;  Location: [facility] INVASIVE CV LAB;  Service: Cardiovascular;  Laterality: N/A;   CARDIOVERSION N/A 09/24/2017   Procedure: CARDIOVERSION;  Surgeon: Darlis Eisenmenger, MD;  Location: [facility] ENDOSCOPY;  Service: Cardiovascular;  Laterality: N/A;   DILATION AND CURETTAGE OF UTERUS     ENDARTERECTOMY FEMORAL Right 03/02/2015   Procedure: ENDARTERECTOMY RIGHT FEMORAL;  Surgeon: Dannis Dy, MD;  Location: [facility] OR;  Service: Vascular;  Laterality: Right;   ESOPHAGOGASTRODUODENOSCOPY (EGD) WITH PROPOFOL  N/A 12/01/2020   Procedure: ESOPHAGOGASTRODUODENOSCOPY (EGD) WITH PROPOFOL ;  Surgeon: Anda Bamberg, MD;  Location: [facility] ENDOSCOPY;  Service: General;  Laterality: N/A;   FACIAL COSMETIC SURGERY Left 2002   related to Bell's Palsy @ age 14; left eye/side of face droopy; tried to make area symmetrical   FEMORAL-POPLITEAL BYPASS GRAFT Right 03/02/2015   Procedure: BYPASS GRAFT FEMORAL-BELOW KNEE POPLITEAL ARTERY;  Surgeon: Dannis Dy, MD;  Location: [facility] OR;  Service: Vascular;  Laterality: Right;   INGUINAL HERNIA REPAIR Bilateral 2002   IR ANGIO INTRA EXTRACRAN SEL COM CAROTID INNOMINATE UNI L MOD SED  11/16/2020   IR CT HEAD LTD  11/13/2020   IR PERCUTANEOUS ART THROMBECTOMY/INFUSION INTRACRANIAL INC DIAG ANGIO  11/13/2020   OVARIAN CYST REMOVAL Left    PEG PLACEMENT N/A 12/01/2020   Procedure: PERCUTANEOUS ENDOSCOPIC  GASTROSTOMY (PEG) PLACEMENT;  Surgeon: Anda Bamberg, MD;  Location: [facility] ENDOSCOPY;  Service: General;  Laterality: N/A;   PERIPHERAL VASCULAR CATHETERIZATION N/A 01/16/2015   Procedure: Abdominal Aortogram;  Surgeon: Dannis Dy, MD;  Location: [facility] INVASIVE CV LAB;  Service: Cardiovascular;  Laterality: N/A;   POSTERIOR LUMBAR FUSION  2015   have plates and screws in there   RADIOLOGY WITH ANESTHESIA N/A 11/13/2020   Procedure: IR WITH ANESTHESIA;  Surgeon: Radiologist, Medication, MD;  Location: [facility] OR;  Service: Radiology;  Laterality: N/A;   TONSILLECTOMY      FAMILY HISTORY: Family History  Problem Relation Age of Onset   Liver cancer Mother    Cancer Mother        Liver   Hypertension Mother    Lung cancer Father    Cancer Father        Lung   Breast cancer Sister    Cancer Sister        Breast    SOCIAL HISTORY: Social History   Socioeconomic History   Marital status: Married    Spouse name: Optometrist   Number of children: Not on file   Years of education: Not on file   Highest education level: Not on file  Occupational History   Not on file  Tobacco Use   Smoking status: Former    Current packs/day: 0.00    Average packs/day: 1 pack/day for 50.0 years (50.0 ttl pk-yrs)    Types: Cigarettes    Start date: 12/14/1964    Quit date: 12/15/2014    Years since quitting: 8.8   Smokeless tobacco: Never  Vaping Use   Vaping status: Never Used  Substance and Sexual Activity   Alcohol  use: Yes    Comment: 11/18/2017 might have a couple glasses of wine/month; if that   Drug use: Not on file   Sexual activity: Not Currently  Other Topics Concern   Not on file  Social  History Narrative   11/21/21 living at [facility]   Social Drivers of Health   Financial Resource Strain: Low Risk  (08/13/2022)   Overall Financial Resource Strain (CARDIA)    Difficulty of Paying Living Expenses: Not hard at all  Food Insecurity: No Food Insecurity (08/13/2022)   Hunger Vital  Sign    Worried About Running Out of Food in the Last Year: Never true    Ran Out of Food in the Last Year: Never true  Transportation Needs: No Transportation Needs (08/13/2022)   PRAPARE - Administrator, Civil Service (Medical): No    Lack of Transportation (Non-Medical): No  Physical Activity: Insufficiently Active (08/13/2022)   Exercise Vital Sign    Days of Exercise per Week: 3 days    Minutes of Exercise per Session: 30 min  Stress: No Stress Concern Present (08/13/2022)   [employer] of Occupational Health - Occupational Stress Questionnaire    Feeling of Stress : Only a little  Social Connections: Moderately Isolated (08/13/2022)   Social Connection and Isolation Panel    Frequency of Communication with Friends and Family: Patient unable to answer    Frequency of Social Gatherings with Friends and Family: More than three times a week    Attends Religious Services: Never    Database administrator or Organizations: No    Attends Banker Meetings: Never    Marital Status: Married  Catering manager Violence: Not At Risk (08/13/2022)   Humiliation, Afraid, Rape, and Kick questionnaire    Fear of Current or Ex-Partner: No    Emotionally Abused: No    Physically Abused: No    Sexually Abused: No      Phebe Brasil, M.D. Ph.D.  [facility] [address], [address] [place], [place] [address] Ph: [phone] Fax: [phone]  CC:  Marguerite Shiley, MD [address] [place],  [place] [address]  Marguerite Shiley, MD

## 2023-10-02 DIAGNOSIS — M62421 Contracture of muscle, right upper arm: Secondary | ICD-10-CM | POA: Diagnosis not present

## 2023-10-02 DIAGNOSIS — R482 Apraxia: Secondary | ICD-10-CM | POA: Diagnosis not present

## 2023-10-02 DIAGNOSIS — I6602 Occlusion and stenosis of left middle cerebral artery: Secondary | ICD-10-CM | POA: Diagnosis not present

## 2023-10-02 DIAGNOSIS — I69322 Dysarthria following cerebral infarction: Secondary | ICD-10-CM | POA: Diagnosis not present

## 2023-10-02 DIAGNOSIS — I69351 Hemiplegia and hemiparesis following cerebral infarction affecting right dominant side: Secondary | ICD-10-CM | POA: Diagnosis not present

## 2023-10-02 DIAGNOSIS — I69328 Other speech and language deficits following cerebral infarction: Secondary | ICD-10-CM | POA: Diagnosis not present

## 2023-10-02 NOTE — Assessment & Plan Note (Signed)
 Please continue your Stiolto 2 puffs once daily. Keep your albuterol  available to use 2 puffs when needed for shortness of breath, chest tightness, wheezing. We talked today about possibly doing a trial on a new nebulized COPD medication called Ohtuvayre .  We can revisit this going forward.

## 2023-10-02 NOTE — Assessment & Plan Note (Signed)
Continue your oxygen as you have been using it. 

## 2023-10-02 NOTE — Assessment & Plan Note (Addendum)
 Chronic infiltrate with scar and some associated rounded atelectasis in the right lower lobe.  Need to continue to follow closely to ensure no evolving nodularity, any evidence for malignancy.  Suspect that she chronically aspirates, has had recurrent pneumonias in this area.  Swallowing precautions are in place.  We reviewed your CT scan of the chest today. Will plan to repeat your CT chest in 6 months, December 2025. Follow Dr. Baldwin Levee in December after your CT chest so we can review those results together.

## 2023-10-04 DIAGNOSIS — M62421 Contracture of muscle, right upper arm: Secondary | ICD-10-CM | POA: Diagnosis not present

## 2023-10-04 DIAGNOSIS — I69351 Hemiplegia and hemiparesis following cerebral infarction affecting right dominant side: Secondary | ICD-10-CM | POA: Diagnosis not present

## 2023-10-04 DIAGNOSIS — I6602 Occlusion and stenosis of left middle cerebral artery: Secondary | ICD-10-CM | POA: Diagnosis not present

## 2023-10-07 DIAGNOSIS — M62421 Contracture of muscle, right upper arm: Secondary | ICD-10-CM | POA: Diagnosis not present

## 2023-10-07 DIAGNOSIS — R482 Apraxia: Secondary | ICD-10-CM | POA: Diagnosis not present

## 2023-10-07 DIAGNOSIS — I69322 Dysarthria following cerebral infarction: Secondary | ICD-10-CM | POA: Diagnosis not present

## 2023-10-07 DIAGNOSIS — I69351 Hemiplegia and hemiparesis following cerebral infarction affecting right dominant side: Secondary | ICD-10-CM | POA: Diagnosis not present

## 2023-10-07 DIAGNOSIS — I6602 Occlusion and stenosis of left middle cerebral artery: Secondary | ICD-10-CM | POA: Diagnosis not present

## 2023-10-07 DIAGNOSIS — I69328 Other speech and language deficits following cerebral infarction: Secondary | ICD-10-CM | POA: Diagnosis not present

## 2023-10-08 ENCOUNTER — Non-Acute Institutional Stay (SKILLED_NURSING_FACILITY): Admitting: Orthopedic Surgery

## 2023-10-08 ENCOUNTER — Encounter: Payer: Self-pay | Admitting: Orthopedic Surgery

## 2023-10-08 DIAGNOSIS — J439 Emphysema, unspecified: Secondary | ICD-10-CM

## 2023-10-08 DIAGNOSIS — J9611 Chronic respiratory failure with hypoxia: Secondary | ICD-10-CM | POA: Diagnosis not present

## 2023-10-08 DIAGNOSIS — F331 Major depressive disorder, recurrent, moderate: Secondary | ICD-10-CM

## 2023-10-08 DIAGNOSIS — I5032 Chronic diastolic (congestive) heart failure: Secondary | ICD-10-CM | POA: Diagnosis not present

## 2023-10-08 DIAGNOSIS — I480 Paroxysmal atrial fibrillation: Secondary | ICD-10-CM | POA: Diagnosis not present

## 2023-10-08 DIAGNOSIS — G8191 Hemiplegia, unspecified affecting right dominant side: Secondary | ICD-10-CM | POA: Diagnosis not present

## 2023-10-08 DIAGNOSIS — K219 Gastro-esophageal reflux disease without esophagitis: Secondary | ICD-10-CM | POA: Diagnosis not present

## 2023-10-08 DIAGNOSIS — R9389 Abnormal findings on diagnostic imaging of other specified body structures: Secondary | ICD-10-CM | POA: Diagnosis not present

## 2023-10-08 DIAGNOSIS — I10 Essential (primary) hypertension: Secondary | ICD-10-CM | POA: Diagnosis not present

## 2023-10-08 DIAGNOSIS — E782 Mixed hyperlipidemia: Secondary | ICD-10-CM | POA: Diagnosis not present

## 2023-10-08 DIAGNOSIS — E440 Moderate protein-calorie malnutrition: Secondary | ICD-10-CM | POA: Diagnosis not present

## 2023-10-08 MED ORDER — ALBUTEROL SULFATE HFA 108 (90 BASE) MCG/ACT IN AERS: 2.0000 | INHALATION_SPRAY | RESPIRATORY_TRACT | Status: DC | PRN

## 2023-10-08 NOTE — Progress Notes (Signed)
 Location:  [facility] Room Number: 116 A Place of Service:  SNF [phone]) Provider:  Greig Cluster, NP     Patient Care Team: [name], MD as PCP - General (Internal Medicine) Jeffrie Oneil BROCKS, MD as PCP - Cardiology (Cardiology) [name], MD as PCP - Electrophysiology (Cardiology) Alix Charleston, MD as Consulting Physician (Neurosurgery)  Extended Emergency Contact Information Primary Emergency Contact: Taff,[name] Address: [address]          [place] [phone] [place] Home Phone: [phone] Mobile Phone: [phone] Relation: Spouse Secondary Emergency Contact: Taillon,[name] Address: [address]          [place], [place] [address] [place] Mobile Phone: [phone] Relation: Son  Code Status:  Full Code Goals of care: Advanced Directive information    06/17/2023   10:31 AM  Advanced Directives  Does Patient Have a Medical Advance Directive? Yes  Type of Estate agent of [place];Living will  Does patient want to make changes to medical advance directive? No - Patient declined  Copy of Healthcare Power of Attorney in Chart? Yes - validated most recent copy scanned in chart (See row information)     Chief Complaint  Patient presents with   routine visit    HPI:  Pt is a 78 y.o. female seen today for medical management of chronic diseases.    She currently resides on the skilled nursing unit at [facility]. 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 Leonie next appointment 06/18 Abnormal CT chest- 06/14 CT chest noted chronic infiltrate and atelectasis to RLL, chronic aspiration suspected per pulmonary,  plan for repeat CT chest in 6 months> 03/2024 Pulmonary emphysema- followed by pulmonary, remains on Stioloto and duonebs prn Chronic respiratory failure with hypoxia- now on 2-3 liters continuous oxygen  HTN- BUN/creat 44/0.86 06/08/2023, remains on amlodipine  HLD- total 164, LDL 67 03/18/2023, statin intolerance, remains on Zetia  CHF- 2D echo LVEF 60-65% 06/2021, remains on furosemide  prn PAF- TSH 0.61 08/08/2022, remains on amiodarone  and Eliquis  Depression- no mood changes, remains on Wellbutrin  GERD- hgb 14.6 06/11/2023, remains on Protonix   Recent weights:  06/01- refused weight  05/01- 85.4 lbs 04/07- 88 lbs 02/18- 86.8 lbs 09/2022- 99 lbs   Recent blood pressures: 06/24- 153/83 06/17- 121/67 06/10- 116/77      Past Medical History:  Diagnosis Date   Anxiety    Arthritis    some in my lower back; probably elbows, knees (11/18/2017)   Atrial fibrillation (HCC)    Bell's palsy    when pt. was 78 yrs old, when under stress the left side of face will droop.   Complication of anesthesia    vascular OR 2016; BP bottomed out; couldn't get it regulated; ended up in ICU for DAYS (11/18/2017)   GERD (gastroesophageal reflux disease)    History of kidney stones    Hypertension    Hypertrophic cardiomyopathy (HCC)    severe LV basilar hypertrophy witn no evidence of significant outflow tract obstruction, EF 65-70%, mild LAE, mild TR, grade 1a diastolic dysfunction 05/15/10 (Dr. Oneil Jeffrie) (Atrial Septal Hypertrophy pattern)-- Intra-op TEE with dsignificant outflow tract obstruction - AI, MR & TR   Insomnia    Mild  aortic sclerosis    Osteopenia    Peripheral vascular disease (HCC)    Syncope    , Vagal   Past Surgical History:  Procedure Laterality Date   AUGMENTATION MAMMAPLASTY Bilateral    BACK SURGERY     CARDIAC CATHETERIZATION N/A 05/07/2016   Procedure: Left Heart Cath and Coronary Angiography;  Surgeon: Lonni JONETTA Cash, MD;  Location: [facility] INVASIVE CV LAB;   Service: Cardiovascular;  Laterality: N/A;   CARDIOVERSION N/A 09/24/2017   Procedure: CARDIOVERSION;  Surgeon: Rolan Ezra RAMAN, MD;  Location: [facility] ENDOSCOPY;  Service: Cardiovascular;  Laterality: N/A;   DILATION AND CURETTAGE OF UTERUS     ENDARTERECTOMY FEMORAL Right 03/02/2015   Procedure: ENDARTERECTOMY RIGHT FEMORAL;  Surgeon: Lonni RAMAN Blade, MD;  Location: [facility] OR;  Service: Vascular;  Laterality: Right;   ESOPHAGOGASTRODUODENOSCOPY (EGD) WITH PROPOFOL  N/A 12/01/2020   Procedure: ESOPHAGOGASTRODUODENOSCOPY (EGD) WITH PROPOFOL ;  Surgeon: Paola Dreama SAILOR, MD;  Location: [facility] ENDOSCOPY;  Service: General;  Laterality: N/A;   FACIAL COSMETIC SURGERY Left 2002   related to Bell's Palsy @ age 5; left eye/side of face droopy; tried to make area symmetrical   FEMORAL-POPLITEAL BYPASS GRAFT Right 03/02/2015   Procedure: BYPASS GRAFT FEMORAL-BELOW KNEE POPLITEAL ARTERY;  Surgeon: Lonni RAMAN Blade, MD;  Location: [facility] OR;  Service: Vascular;  Laterality: Right;   INGUINAL HERNIA REPAIR Bilateral 2002   IR ANGIO INTRA EXTRACRAN SEL COM CAROTID INNOMINATE UNI L MOD SED  11/16/2020   IR CT HEAD LTD  11/13/2020   IR PERCUTANEOUS ART THROMBECTOMY/INFUSION INTRACRANIAL INC DIAG ANGIO  11/13/2020   OVARIAN CYST REMOVAL Left    PEG PLACEMENT N/A 12/01/2020   Procedure: PERCUTANEOUS ENDOSCOPIC GASTROSTOMY (PEG) PLACEMENT;  Surgeon: Paola Dreama SAILOR, MD;  Location: [facility] ENDOSCOPY;  Service: General;  Laterality: N/A;   PERIPHERAL VASCULAR CATHETERIZATION N/A 01/16/2015   Procedure: Abdominal Aortogram;  Surgeon: Lonni RAMAN Blade, MD;  Location: [facility] INVASIVE CV LAB;  Service: Cardiovascular;  Laterality: N/A;   POSTERIOR LUMBAR FUSION  2015   have plates and screws in there   RADIOLOGY WITH ANESTHESIA N/A 11/13/2020   Procedure: IR WITH ANESTHESIA;  Surgeon: Radiologist, Medication, MD;  Location: [facility] OR;  Service: Radiology;  Laterality: N/A;   TONSILLECTOMY      Allergies  Allergen Reactions    Amoxicillin Other (See Comments)    UTI Has patient had a PCN reaction causing immediate rash, facial/tongue/throat swelling, SOB or lightheadedness with hypotension: No Has patient had a PCN reaction causing severe rash involving mucus membranes or skin necrosis: No Has patient had a PCN reaction that required hospitalization: No Has patient had a PCN reaction occurring within the last 10 years: Yes--UTI ONLY If all of the above answers are NO, then may proceed with Cephalosporin use.    Atenolol Cough   Crestor  [Rosuvastatin  Calcium ] Other (See Comments)    Muscle aches   Pravastatin  Other (See Comments)    Muscle aches   Rosuvastatin  Calcium     Sulfa Antibiotics Nausea Only   Codeine Other (See Comments)    hallucinations    Outpatient Encounter Medications as of 10/08/2023  Medication Sig   acetaminophen  (TYLENOL ) 325 MG tablet Take 650 mg by mouth every 6 (six) hours as needed for mild pain or fever.   albuterol  (VENTOLIN  HFA) 108 (90 Base) MCG/ACT inhaler Inhale 2 puffs into the lungs every 6 (six) hours as needed for wheezing or shortness of breath. (Patient taking differently: Inhale 2 puffs into the lungs every 4 (four)  hours as needed for wheezing or shortness of breath.)   amiodarone  (PACERONE ) 200 MG tablet Take 100 mg by mouth daily. May give crushed   amLODipine  (NORVASC ) 5 MG tablet Take 2.5 mg by mouth daily.   apixaban  (ELIQUIS ) 5 MG TABS tablet Take 5 mg by mouth 2 (two) times daily. May give crushed   Benzonatate  (TESSALON  PERLES PO) Take 200 mg by mouth as needed. 200mg , oral as needed, Tessalon  Perles, 200mg , PO TID PRN cough   bisacodyl  (DULCOLAX) 5 MG EC tablet Take 5 mg by mouth 2 (two) times daily as needed for moderate constipation.   buPROPion  (WELLBUTRIN  XL) 150 MG 24 hr tablet Take 150 mg by mouth daily.   diclofenac Sodium (VOLTAREN) 1 % GEL Apply one application to right hip and knee four times daily as needed.   Emollient (CERAVE) CREA Apply 1  Application topically as needed.   Eyelid Cleansers (OCUSOFT EYELID CLEANSING) PADS Apply 1 Application topically daily.   ezetimibe  (ZETIA ) 10 MG tablet Take 10 mg by mouth daily. Oral if crushed   furosemide  (LASIX ) 20 MG tablet Take 20 mg by mouth daily as needed. Give Lasix  20mg  for weight gain equal or greater than 3 pounds   hydrALAZINE  (APRESOLINE ) 10 MG tablet Take 10 mg by mouth 3 (three) times daily as needed (SBP > 165 or DBP > 95).   hydrocortisone cream 1 % Apply 1 Application topically 2 (two) times daily as needed for itching.   ipratropium-albuterol  (DUONEB) 0.5-2.5 (3) MG/3ML SOLN Inhale 3 mLs into the lungs every 4 (four) hours as needed. (Patient taking differently: Inhale 3 mLs into the lungs every 6 (six) hours as needed.)   ketoconazole  (NIZORAL ) 2 % cream Apply 1 application  topically daily as needed for irritation.   ondansetron  (ZOFRAN ) 4 MG tablet Take 4 mg by mouth every 6 (six) hours as needed for nausea or vomiting.   OXYGEN  Inhale 3 L into the lungs as directed. to maintain sats > or equal to 90%. May titrate Three Times A Day; 07:00 AM - 03:00 PM, 03:00 PM - 11:00 PM, 11:00 PM - 7:00 am   pantoprazole  (PROTONIX ) 40 MG tablet Take 40 mg by mouth 2 (two) times daily.   Polyethyl Glycol-Propyl Glycol (SYSTANE) 0.4-0.3 % SOLN Apply 2 drops to eye every 6 (six) hours as needed (dry eyes).   polyethylene glycol powder (GLYCOLAX /MIRALAX ) 17 GM/SCOOP powder Take 17 g by mouth every other day.   sennosides-docusate sodium  (SENOKOT-S) 8.6-50 MG tablet Take 2 tablets by mouth 2 (two) times daily.   Tiotropium Bromide-Olodaterol (STIOLTO RESPIMAT ) 2.5-2.5 MCG/ACT AERS Inhale 2 puffs into the lungs daily.   nystatin cream (MYCOSTATIN) Apply 1 application  topically 2 (two) times daily as needed for dry skin (apply to perirectal rash). (Patient not taking: Reported on 10/08/2023)   No facility-administered encounter medications on file as of 10/08/2023.    Review of Systems   Constitutional:  Negative for fatigue and fever.  HENT:  Positive for trouble swallowing. Negative for sore throat.   Eyes:  Negative for visual disturbance.  Respiratory:  Positive for cough, shortness of breath and wheezing.   Cardiovascular:  Negative for chest pain and leg swelling.  Gastrointestinal:  Negative for abdominal distention and abdominal pain.  Genitourinary:  Negative for dysuria, frequency and hematuria.  Musculoskeletal:  Positive for gait problem.  Neurological:  Positive for weakness. Negative for dizziness and headaches.  Psychiatric/Behavioral:  Positive for dysphoric mood. Negative for sleep disturbance. The patient  is not nervous/anxious.     Immunization History  Administered Date(s) Administered   Fluad Quad(high Dose 65+) 02/05/2022, 01/08/2023   Fluzone Influenza virus vaccine,trivalent (IIV3), split virus 04/30/2010, 01/26/2016, 02/07/2018, 02/01/2020   Influenza Split 01/16/2009   Influenza, High Dose Seasonal PF 02/07/2018, 01/25/2019, 02/28/2021, 02/05/2022   Influenza,inj,Quad PF,6+ Mos 03/06/2012, 01/26/2016, 01/10/2017   Influenza,inj,quad, With Preservative 02/14/2015   Moderna Covid-19 Vaccine Bivalent Booster 55yrs & up 02/28/2021, 02/04/2023   PFIZER(Purple Top)SARS-COV-2 Vaccination 05/06/2019, 05/27/2019, 01/27/2020, 09/18/2020   Pneumococcal Conjugate-13 03/19/2017   Pneumococcal Polysaccharide-23 02/14/2015   Respiratory Syncytial Virus Vaccine,Recomb Aduvanted(Arexvy) 02/15/2022   Td 02/25/2005   Tdap 05/19/2013   Zoster, Live 07/28/2006   Pertinent  Health Maintenance Due  Topic Date Due   INFLUENZA VACCINE  11/14/2023   DEXA SCAN  Discontinued      05/07/2022   11:59 AM 08/13/2022   11:02 AM 11/26/2022   12:34 PM 12/31/2022    3:26 PM 03/11/2023    2:48 PM  Fall Risk  Falls in the past year? 0 1 0 0 1  Was there an injury with Fall? 0 0 0 0 0  Fall Risk Category Calculator 0 1 0 0 1  Patient at Risk for Falls Due to No Fall  Risks History of fall(s);Impaired balance/gait;Impaired mobility History of fall(s);Impaired balance/gait History of fall(s);Impaired balance/gait;Impaired mobility History of fall(s);Impaired balance/gait;Impaired mobility  Patient at Risk for Falls Due to - Comments  right sided hemiparesis     Fall risk Follow up Falls evaluation completed  Falls evaluation completed;Education provided;Falls prevention discussed Falls evaluation completed;Education provided Falls evaluation completed;Education provided;Falls prevention discussed Falls evaluation completed;Education provided;Falls prevention discussed     Data saved with a previous flowsheet row definition   Functional Status Survey:    Vitals:   10/08/23 1126  BP: (!) 153/83  Pulse: 62  Resp: 16  Temp: (!) 97.1 F (36.2 C)  SpO2: 97%  Weight: 85 lb 6.4 oz (38.7 kg)  Height: 5' 4 (1.626 m)   Body mass index is 14.66 kg/m. Physical Exam Vitals reviewed.  Constitutional:      Appearance: She is underweight.  HENT:     Head: Normocephalic.   Eyes:     General:        Right eye: No discharge.        Left eye: No discharge.    Cardiovascular:     Rate and Rhythm: Normal rate and regular rhythm.     Pulses: Normal pulses.     Heart sounds: Normal heart sounds.  Pulmonary:     Effort: Pulmonary effort is normal. No respiratory distress.     Breath sounds: Normal breath sounds. No wheezing or rales.     Comments: 2 liters oxygen  Abdominal:     General: There is no distension.     Palpations: Abdomen is soft.     Tenderness: There is no abdominal tenderness.   Musculoskeletal:     Cervical back: Neck supple.     Right lower leg: No edema.     Left lower leg: No edema.   Skin:    General: Skin is warm.     Capillary Refill: Capillary refill takes less than 2 seconds.   Neurological:     General: No focal deficit present.     Mental Status: She is alert. Mental status is at baseline.     Motor: Weakness present.      Gait: Gait abnormal.   Psychiatric:  Mood and Affect: Mood normal.     Labs reviewed: Recent Labs    03/18/23 0000 05/21/23 0000  NA 140 142  K 4.4 4.2  CL 106 107  CO2 22 25*  BUN 18 24*  CREATININE 0.8 1.0  CALCIUM  10.0 8.9   Recent Labs    03/18/23 0000 05/21/23 0000 06/24/23 0000  AST 27 30 30   ALT 23 35 30  ALKPHOS 96 93 95  ALBUMIN  4.2 3.4* 3.0*   Recent Labs    03/18/23 0000 05/21/23 0000  WBC 4.0 7.2  HGB 12.5 13.6  HCT 38 41  PLT 180 246   Lab Results  Component Value Date   TSH 0.61 08/08/2022   Lab Results  Component Value Date   HGBA1C 6.0 (H) 11/13/2020   Lab Results  Component Value Date   CHOL 164 03/18/2023   HDL 61 03/18/2023   LDLCALC 67 03/18/2023   TRIG 179 (A) 03/18/2023   CHOLHDL 3.0 11/13/2020    Significant Diagnostic Results in last 30 days:  CT Chest Wo Contrast Result Date: 09/27/2023 CLINICAL DATA:  Follow-up for right lower lobe consolidation. Shortness of breath on oxygen . EXAM: CT CHEST WITHOUT CONTRAST TECHNIQUE: Multidetector CT imaging of the chest was performed following the standard protocol without IV contrast. RADIATION DOSE REDUCTION: This exam was performed according to the departmental dose-optimization program which includes automated exposure control, adjustment of the mA and/or kV according to patient size and/or use of iterative reconstruction technique. COMPARISON:  Chest CTs without contrast from 07/09/2023 and 06/24/2023, CTA chest 12/05/2020. AP Lat chest from 06/18/2023 is also reviewed. FINDINGS: Cardiovascular: There is mild-to-moderate panchamber cardiomegaly with a left chamber predominance, small chronic pericardial effusion. The coronary arteries and aorta are heavily calcified. There are patchy calcifications in the great vessels. The mid ascending aorta measures 4.1 by 4.1 cm on 6:77 and 7:57, previously 4.2 x 4.2 cm. Ulcerative plaque is again noted to the left posterolateral aspect of the  distal descending aorta, grossly unchanged. Rest of the aorta is ectatic but nonaneurysmal. Central pulmonary veins are mildly distended but unchanged. There are enlarged central pulmonary arteries with chronic enlarged pulmonary trunk 3.9 cm indicating arterial hypertension. Mediastinum/Nodes: Stable prominent precarinal lymph node 1.4 cm in short axis on 2:61. Stable prominent subcarinal nodes largest is 1 cm on 2:62. No new or progressive adenopathy is seen without contrast, with limited visualization for hilar adenopathy. There is a heterogeneous 1.9 cm nodule in the left lobe of the thyroid  gland, unclear if this was present in 2022 due to streak artifacts from overlying wires on that study. Nonemergent ultrasound follow-up is recommended. Rest of thyroid  is without focal mass. Negative thoracic trachea, main bronchi and thoracic esophagus. Lungs/Pleura: Severe panlobular emphysematous disease predominating in the upper lobes. No pleural effusion, thickening or pneumothorax is seen. In the right lower lobe, there is volume loss with again noted area of consolidation and bronchiectasis replacing much of the basal segments except for a portion of the medial basal segment. I suspect this is probably a fibrotic consolidation such as due to chronic aspiration although active infection certainly is possible. This was beginning to develop in 2022. Just above this area of consolidation posteriorly there is an ovoid opacity measuring 2.2 x 1.2 cm on 4:87 which was also noted on the 2 prior studies. This is probably either a rounded atelectasis or nodular scar but underlying neoplasm is possible. PET-CT is recommended for further study. There are coarse linear markings extending above  this opacity into the superior segment. There is increased coarse linear atelectasis in the posterolateral right middle lobe. Minimal posterior pleuroparenchymal densities in the left lower lobe consistent with atelectasis with perifissural  lingular atelectasis. There is diffuse bronchial thickening. 5 mm posterior left lower lobe nodule on 4:92 is unchanged over the last 2 studies but the area obscured by atelectasis 2022. Calcified granuloma noted in the left upper lobe. Upper Abdomen: Multiple small nonobstructing caliceal stones left kidney. Abdominal aortic atherosclerosis. No acute upper abdominal findings. Musculoskeletal: Rim calcified breast implants. Osteopenia thoracic kyphodextroscoliosis again noted with chronic compression fractures T5, T7, T9, T11, and L1 with T7 collapsed into T8 with interbody ankylosis chronically seen. No acute or other significant osseous findings. There is a calcified osteochondral loose body in the left subcoracoid bursa. IMPRESSION: 1. Right lower lobe volume loss with area of consolidation and bronchiectasis replacing much of the basal segments. I suspect this is probably a fibrotic consolidation such as due to chronic aspiration although active infection certainly is possible. This was beginning to develop in 2022. 2. Just above this area of consolidation posteriorly there is an ovoid opacity measuring 2.2 x 1.2 cm which was also noted on the 2 recent prior studies. This is probably either a rounded atelectasis or nodular scar but underlying neoplasm is possible. PET-CT is recommended for further study. 3. 5 mm posterior left lower lobe nodule unchanged over the last 2 studies but the area obscured by atelectasis in 2022. 4. Severe emphysema with diffuse bronchial thickening. 5. Cardiomegaly with small chronic pericardial effusion. 6. Aortic and coronary artery atherosclerosis. Chronic dilatation of the pulmonary arteries. 7. Stable mildly prominent mediastinal lymph nodes. 8. 1.9 cm heterogeneous nodule in the left lobe of the thyroid  gland. Nonemergent ultrasound follow-up recommended. 9. Osteopenia, kyphodextroscoliosis and multiple chronic compression fractures. 10. Nonobstructing left renal stones. Aortic  Atherosclerosis (ICD10-I70.0) and Emphysema (ICD10-J43.9). Electronically Signed   By: Francis Quam M.D.   On: 09/27/2023 21:29    Assessment/Plan 1. Moderate protein malnutrition (HCC) (Primary) - BMI 14.66 - albumin  3.0, protein 5.5 06/24/2023 - weight down 15 lbs from 09/2022 - unsuccessful trial Boost, Ensure, magic cups, fancy cheeses per dietary - refused June 2025 weight  - discussed with dietary> plan to discuss with patient/husband  2. Right hemiplegia (HCC) - followed by neurology - 06/18 botox  injections - cont skilled nursing  3. Abnormal CT of the chest - followed by Dr. Shelah - recent CT chest noted chronic infiltrate/atelectasis RLL - repeat CT chest in 6 months> due 03/2024  4. Pulmonary emphysema, unspecified emphysema type (HCC) - cont stiolito and duonebs  5. Chronic respiratory failure with hypoxia (HCC) - cont oxygen   6. Essential hypertension - controlled with amlodipine   7. Mixed hyperlipidemia - statin allergy - LDL 67 03/2023 - cont Zetia   8. Chronic diastolic congestive heart failure (HCC) - compensated - cont furosemide   9. PAF (paroxysmal atrial fibrillation) (HCC) - HR< 100 with amiodarone  - cont Eliquis  for clot prevention  10. Moderate episode of recurrent major depressive disorder (HCC) - no mood changes - very supportive husband - cont Wellbutrin   11. Gastroesophageal reflux disease without esophagitis - hgb stable - cont Protonix     Family/ staff Communication: plan discussed with nurse  Labs/tests ordered:  none

## 2023-10-13 ENCOUNTER — Encounter: Payer: Self-pay | Admitting: Adult Health

## 2023-10-13 ENCOUNTER — Non-Acute Institutional Stay (SKILLED_NURSING_FACILITY): Admitting: Adult Health

## 2023-10-13 DIAGNOSIS — N390 Urinary tract infection, site not specified: Secondary | ICD-10-CM | POA: Diagnosis not present

## 2023-10-13 DIAGNOSIS — R310 Gross hematuria: Secondary | ICD-10-CM | POA: Diagnosis not present

## 2023-10-13 DIAGNOSIS — K5901 Slow transit constipation: Secondary | ICD-10-CM | POA: Diagnosis not present

## 2023-10-13 NOTE — Progress Notes (Unsigned)
 Location:  [facility] Room Number: 116 A Place of Service:  SNF [phone]) Provider:  Tawni America, NP    Patient Care Team: [name], MD as PCP - General (Internal Medicine) Jeffrie Oneil BROCKS, MD as PCP - Cardiology (Cardiology) [name], MD as PCP - Electrophysiology (Cardiology) Alix Charleston, MD as Consulting Physician (Neurosurgery)  Extended Emergency Contact Information Primary Emergency Contact: Arvidson,[name] Address: [address]          [place] [phone] [place] Home Phone: [phone] Mobile Phone: [phone] Relation: Spouse Secondary Emergency Contact: Umscheid,[name] Address: [address]          [place], [place] [address] [place] Mobile Phone: [phone] Relation: Son  Code Status:  Full Code Goals of care: Advanced Directive information    06/17/2023   10:31 AM  Advanced Directives  Does Patient Have a Medical Advance Directive? Yes  Type of Estate agent of [place];Living will  Does patient want to make changes to medical advance directive? No - Patient declined  Copy of Healthcare Power of Attorney in Chart? Yes - validated most recent copy scanned in chart (See row information)     Chief Complaint  Patient presents with   Acute Visit    Blood in urine    HPI:  The patient presents with blood in her urine.  She noticed blood in her urine today, which she confirmed is not vaginal bleeding. No associated pain, burning sensation, fever, or other symptoms. No history of kidney stones.  She experiences some back pain chronically but this has not changed.   Constipation has been a concern, although she had a bowel movement the day before yesterday. Her appetite varies depending on the quality of the food.  Past Medical History:  Diagnosis Date   Anxiety    Arthritis    some in my lower back; probably elbows, knees (11/18/2017)    Atrial fibrillation (HCC)    Bell's palsy    when pt. was 78 yrs old, when under stress the left side of face will droop.   Complication of anesthesia    vascular OR 2016; BP bottomed out; couldn't get it regulated; ended up in ICU for DAYS (11/18/2017)   GERD (gastroesophageal reflux disease)    History of kidney stones    Hypertension    Hypertrophic cardiomyopathy (HCC)    severe LV basilar hypertrophy witn no evidence of significant outflow tract obstruction, EF 65-70%, mild LAE, mild TR, grade 1a diastolic dysfunction 05/15/10 (Dr. Oneil Jeffrie) (Atrial Septal Hypertrophy pattern)-- Intra-op TEE with dsignificant outflow tract obstruction - AI, MR & TR   Insomnia    Mild aortic sclerosis    Osteopenia    Peripheral vascular disease (HCC)    Syncope    , Vagal   Past Surgical History:  Procedure Laterality Date   AUGMENTATION MAMMAPLASTY Bilateral    BACK SURGERY     CARDIAC CATHETERIZATION N/A 05/07/2016   Procedure: Left Heart Cath and Coronary Angiography;  Surgeon: Lonni JONETTA Cash, MD;  Location: [facility] INVASIVE CV LAB;  Service: Cardiovascular;  Laterality: N/A;   CARDIOVERSION N/A 09/24/2017   Procedure: CARDIOVERSION;  Surgeon: Rolan Ezra RAMAN, MD;  Location: [facility] ENDOSCOPY;  Service: Cardiovascular;  Laterality: N/A;   DILATION AND CURETTAGE OF UTERUS     ENDARTERECTOMY FEMORAL Right 03/02/2015   Procedure: ENDARTERECTOMY RIGHT FEMORAL;  Surgeon: Lonni RAMAN Blade, MD;  Location: [facility] OR;  Service:  Vascular;  Laterality: Right;   ESOPHAGOGASTRODUODENOSCOPY (EGD) WITH PROPOFOL  N/A 12/01/2020   Procedure: ESOPHAGOGASTRODUODENOSCOPY (EGD) WITH PROPOFOL ;  Surgeon: Paola Dreama SAILOR, MD;  Location: [facility] ENDOSCOPY;  Service: General;  Laterality: N/A;   FACIAL COSMETIC SURGERY Left 2002   related to Bell's Palsy @ age 43; left eye/side of face droopy; tried to make area symmetrical   FEMORAL-POPLITEAL BYPASS GRAFT Right 03/02/2015   Procedure: BYPASS GRAFT FEMORAL-BELOW KNEE  POPLITEAL ARTERY;  Surgeon: Lonni GORMAN Blade, MD;  Location: [facility] OR;  Service: Vascular;  Laterality: Right;   INGUINAL HERNIA REPAIR Bilateral 2002   IR ANGIO INTRA EXTRACRAN SEL COM CAROTID INNOMINATE UNI L MOD SED  11/16/2020   IR CT HEAD LTD  11/13/2020   IR PERCUTANEOUS ART THROMBECTOMY/INFUSION INTRACRANIAL INC DIAG ANGIO  11/13/2020   OVARIAN CYST REMOVAL Left    PEG PLACEMENT N/A 12/01/2020   Procedure: PERCUTANEOUS ENDOSCOPIC GASTROSTOMY (PEG) PLACEMENT;  Surgeon: Paola Dreama SAILOR, MD;  Location: [facility] ENDOSCOPY;  Service: General;  Laterality: N/A;   PERIPHERAL VASCULAR CATHETERIZATION N/A 01/16/2015   Procedure: Abdominal Aortogram;  Surgeon: Lonni GORMAN Blade, MD;  Location: [facility] INVASIVE CV LAB;  Service: Cardiovascular;  Laterality: N/A;   POSTERIOR LUMBAR FUSION  2015   have plates and screws in there   RADIOLOGY WITH ANESTHESIA N/A 11/13/2020   Procedure: IR WITH ANESTHESIA;  Surgeon: Radiologist, Medication, MD;  Location: [facility] OR;  Service: Radiology;  Laterality: N/A;   TONSILLECTOMY      Allergies  Allergen Reactions   Amoxicillin Other (See Comments)    UTI Has patient had a PCN reaction causing immediate rash, facial/tongue/throat swelling, SOB or lightheadedness with hypotension: No Has patient had a PCN reaction causing severe rash involving mucus membranes or skin necrosis: No Has patient had a PCN reaction that required hospitalization: No Has patient had a PCN reaction occurring within the last 10 years: Yes--UTI ONLY If all of the above answers are NO, then may proceed with Cephalosporin use.    Atenolol Cough   Crestor  [Rosuvastatin  Calcium ] Other (See Comments)    Muscle aches   Pravastatin  Other (See Comments)    Muscle aches   Rosuvastatin  Calcium     Sulfa Antibiotics Nausea Only   Codeine Other (See Comments)    hallucinations    Outpatient Encounter Medications as of 10/13/2023  Medication Sig   acetaminophen  (TYLENOL ) 325 MG tablet Take 650 mg by  mouth every 6 (six) hours as needed for mild pain or fever.   albuterol  (VENTOLIN  HFA) 108 (90 Base) MCG/ACT inhaler Inhale 2 puffs into the lungs every 4 (four) hours as needed for wheezing or shortness of breath.   amiodarone  (PACERONE ) 200 MG tablet Take 100 mg by mouth daily. May give crushed   amLODipine  (NORVASC ) 5 MG tablet Take 2.5 mg by mouth daily.   apixaban  (ELIQUIS ) 5 MG TABS tablet Take 5 mg by mouth 2 (two) times daily. May give crushed   Benzonatate  (TESSALON  PERLES PO) Take 200 mg by mouth as needed. 200mg , oral as needed, Tessalon  Perles, 200mg , PO TID PRN cough   bisacodyl  (DULCOLAX) 5 MG EC tablet Take 5 mg by mouth 2 (two) times daily as needed for moderate constipation.   buPROPion  (WELLBUTRIN  XL) 150 MG 24 hr tablet Take 150 mg by mouth daily.   diclofenac Sodium (VOLTAREN) 1 % GEL Apply one application to right hip and knee four times daily as needed.   Emollient (CERAVE) CREA Apply 1 Application topically as needed.   Eyelid  Cleansers (OCUSOFT EYELID CLEANSING) PADS Apply 1 Application topically daily.   ezetimibe  (ZETIA ) 10 MG tablet Take 10 mg by mouth daily. Oral if crushed   furosemide  (LASIX ) 20 MG tablet Take 20 mg by mouth daily as needed. Give Lasix  20mg  for weight gain equal or greater than 3 pounds   hydrALAZINE  (APRESOLINE ) 10 MG tablet Take 10 mg by mouth 3 (three) times daily as needed (SBP > 165 or DBP > 95).   hydrocortisone cream 1 % Apply 1 Application topically 2 (two) times daily as needed for itching.   ipratropium-albuterol  (DUONEB) 0.5-2.5 (3) MG/3ML SOLN Inhale 3 mLs into the lungs every 6 (six) hours as needed.   ketoconazole  (NIZORAL ) 2 % cream Apply 1 application  topically daily as needed for irritation.   ondansetron  (ZOFRAN ) 4 MG tablet Take 4 mg by mouth every 6 (six) hours as needed for nausea or vomiting.   OXYGEN  Inhale 3 L into the lungs as directed. to maintain sats > or equal to 90%. May titrate Three Times A Day; 07:00 AM - 03:00 PM,  03:00 PM - 11:00 PM, 11:00 PM - 7:00 am   pantoprazole  (PROTONIX ) 40 MG tablet Take 40 mg by mouth 2 (two) times daily.   Polyethyl Glycol-Propyl Glycol (SYSTANE) 0.4-0.3 % SOLN Apply 2 drops to eye every 6 (six) hours as needed (dry eyes).   polyethylene glycol powder (GLYCOLAX /MIRALAX ) 17 GM/SCOOP powder Take 17 g by mouth every other day.   sennosides-docusate sodium  (SENOKOT-S) 8.6-50 MG tablet Take 2 tablets by mouth 2 (two) times daily.   Tiotropium Bromide-Olodaterol (STIOLTO RESPIMAT ) 2.5-2.5 MCG/ACT AERS Inhale 2 puffs into the lungs daily.   No facility-administered encounter medications on file as of 10/13/2023.    Review of Systems  Constitutional:  Negative for activity change, appetite change, chills, diaphoresis, fatigue and fever.  HENT:  Negative for congestion.   Respiratory:  Negative for cough, shortness of breath and wheezing.   Cardiovascular:  Negative for chest pain and leg swelling.  Gastrointestinal:  Negative for abdominal distention, abdominal pain, constipation, diarrhea, nausea and vomiting.  Genitourinary:  Positive for hematuria. Negative for difficulty urinating, dysuria, flank pain, frequency, menstrual problem, pelvic pain and urgency.  Musculoskeletal:  Negative for back pain, gait problem, myalgias and neck pain.  Skin:  Negative for rash.  Neurological:  Negative for dizziness and weakness.  Psychiatric/Behavioral:  Negative for confusion.     Immunization History  Administered Date(s) Administered   Fluad Quad(high Dose 65+) 02/05/2022, 01/08/2023   Fluzone Influenza virus vaccine,trivalent (IIV3), split virus 04/30/2010, 01/26/2016, 02/07/2018, 02/01/2020   Influenza Split 01/16/2009   Influenza, High Dose Seasonal PF 02/07/2018, 01/25/2019, 02/28/2021, 02/05/2022   Influenza,inj,Quad PF,6+ Mos 03/06/2012, 01/26/2016, 01/10/2017   Influenza,inj,quad, With Preservative 02/14/2015   Moderna Covid-19 Vaccine Bivalent Booster 3yrs & up 02/28/2021,  02/04/2023   PFIZER(Purple Top)SARS-COV-2 Vaccination 05/06/2019, 05/27/2019, 01/27/2020, 09/18/2020   Pneumococcal Conjugate-13 03/19/2017   Pneumococcal Polysaccharide-23 02/14/2015   Respiratory Syncytial Virus Vaccine,Recomb Aduvanted(Arexvy) 02/15/2022   Td 02/25/2005   Tdap 05/19/2013   Zoster, Live 07/28/2006   Pertinent  Health Maintenance Due  Topic Date Due   INFLUENZA VACCINE  11/14/2023   DEXA SCAN  Discontinued      05/07/2022   11:59 AM 08/13/2022   11:02 AM 11/26/2022   12:34 PM 12/31/2022    3:26 PM 03/11/2023    2:48 PM  Fall Risk  Falls in the past year? 0 1 0 0 1  Was there an injury with  Fall? 0 0 0 0 0  Fall Risk Category Calculator 0 1 0 0 1  Patient at Risk for Falls Due to No Fall Risks History of fall(s);Impaired balance/gait;Impaired mobility History of fall(s);Impaired balance/gait History of fall(s);Impaired balance/gait;Impaired mobility History of fall(s);Impaired balance/gait;Impaired mobility  Patient at Risk for Falls Due to - Comments  right sided hemiparesis     Fall risk Follow up Falls evaluation completed  Falls evaluation completed;Education provided;Falls prevention discussed Falls evaluation completed;Education provided Falls evaluation completed;Education provided;Falls prevention discussed Falls evaluation completed;Education provided;Falls prevention discussed     Data saved with a previous flowsheet row definition   Functional Status Survey:    Vitals:   10/13/23 0916  BP: (!) 153/83  Pulse: 62  Resp: 16  Temp: (!) 97.1 F (36.2 C)  SpO2: 97%  Weight: 85 lb 6.4 oz (38.7 kg)  Height: 5' 4 (1.626 m)   Body mass index is 14.66 kg/m. Physical Exam Constitutional:      General: She is not in acute distress.    Appearance: She is not diaphoretic.  HENT:     Head: Normocephalic and atraumatic.  Neck:     Vascular: No JVD.   Cardiovascular:     Rate and Rhythm: Normal rate and regular rhythm.     Heart sounds: No murmur  heard. Pulmonary:     Effort: Pulmonary effort is normal. No respiratory distress.     Breath sounds: No wheezing.     Comments: Decreased breath sounds.   Skin:    General: Skin is warm and dry.   Neurological:     Mental Status: She is alert and oriented to person, place, and time.     Labs reviewed: Recent Labs    03/18/23 0000 05/21/23 0000  NA 140 142  K 4.4 4.2  CL 106 107  CO2 22 25*  BUN 18 24*  CREATININE 0.8 1.0  CALCIUM  10.0 8.9   Recent Labs    03/18/23 0000 05/21/23 0000 06/24/23 0000  AST 27 30 30   ALT 23 35 30  ALKPHOS 96 93 95  ALBUMIN  4.2 3.4* 3.0*   Recent Labs    03/18/23 0000 05/21/23 0000  WBC 4.0 7.2  HGB 12.5 13.6  HCT 38 41  PLT 180 246   Lab Results  Component Value Date   TSH 0.61 08/08/2022   Lab Results  Component Value Date   HGBA1C 6.0 (H) 11/13/2020   Lab Results  Component Value Date   CHOL 164 03/18/2023   HDL 61 03/18/2023   LDLCALC 67 03/18/2023   TRIG 179 (A) 03/18/2023   CHOLHDL 3.0 11/13/2020    Significant Diagnostic Results in last 30 days:  CT Chest Wo Contrast Result Date: 09/27/2023 CLINICAL DATA:  Follow-up for right lower lobe consolidation. Shortness of breath on oxygen . EXAM: CT CHEST WITHOUT CONTRAST TECHNIQUE: Multidetector CT imaging of the chest was performed following the standard protocol without IV contrast. RADIATION DOSE REDUCTION: This exam was performed according to the departmental dose-optimization program which includes automated exposure control, adjustment of the mA and/or kV according to patient size and/or use of iterative reconstruction technique. COMPARISON:  Chest CTs without contrast from 07/09/2023 and 06/24/2023, CTA chest 12/05/2020. AP Lat chest from 06/18/2023 is also reviewed. FINDINGS: Cardiovascular: There is mild-to-moderate panchamber cardiomegaly with a left chamber predominance, small chronic pericardial effusion. The coronary arteries and aorta are heavily calcified.  There are patchy calcifications in the great vessels. The mid ascending aorta measures 4.1 by  4.1 cm on 6:77 and 7:57, previously 4.2 x 4.2 cm. Ulcerative plaque is again noted to the left posterolateral aspect of the distal descending aorta, grossly unchanged. Rest of the aorta is ectatic but nonaneurysmal. Central pulmonary veins are mildly distended but unchanged. There are enlarged central pulmonary arteries with chronic enlarged pulmonary trunk 3.9 cm indicating arterial hypertension. Mediastinum/Nodes: Stable prominent precarinal lymph node 1.4 cm in short axis on 2:61. Stable prominent subcarinal nodes largest is 1 cm on 2:62. No new or progressive adenopathy is seen without contrast, with limited visualization for hilar adenopathy. There is a heterogeneous 1.9 cm nodule in the left lobe of the thyroid  gland, unclear if this was present in 2022 due to streak artifacts from overlying wires on that study. Nonemergent ultrasound follow-up is recommended. Rest of thyroid  is without focal mass. Negative thoracic trachea, main bronchi and thoracic esophagus. Lungs/Pleura: Severe panlobular emphysematous disease predominating in the upper lobes. No pleural effusion, thickening or pneumothorax is seen. In the right lower lobe, there is volume loss with again noted area of consolidation and bronchiectasis replacing much of the basal segments except for a portion of the medial basal segment. I suspect this is probably a fibrotic consolidation such as due to chronic aspiration although active infection certainly is possible. This was beginning to develop in 2022. Just above this area of consolidation posteriorly there is an ovoid opacity measuring 2.2 x 1.2 cm on 4:87 which was also noted on the 2 prior studies. This is probably either a rounded atelectasis or nodular scar but underlying neoplasm is possible. PET-CT is recommended for further study. There are coarse linear markings extending above this opacity into the  superior segment. There is increased coarse linear atelectasis in the posterolateral right middle lobe. Minimal posterior pleuroparenchymal densities in the left lower lobe consistent with atelectasis with perifissural lingular atelectasis. There is diffuse bronchial thickening. 5 mm posterior left lower lobe nodule on 4:92 is unchanged over the last 2 studies but the area obscured by atelectasis 2022. Calcified granuloma noted in the left upper lobe. Upper Abdomen: Multiple small nonobstructing caliceal stones left kidney. Abdominal aortic atherosclerosis. No acute upper abdominal findings. Musculoskeletal: Rim calcified breast implants. Osteopenia thoracic kyphodextroscoliosis again noted with chronic compression fractures T5, T7, T9, T11, and L1 with T7 collapsed into T8 with interbody ankylosis chronically seen. No acute or other significant osseous findings. There is a calcified osteochondral loose body in the left subcoracoid bursa. IMPRESSION: 1. Right lower lobe volume loss with area of consolidation and bronchiectasis replacing much of the basal segments. I suspect this is probably a fibrotic consolidation such as due to chronic aspiration although active infection certainly is possible. This was beginning to develop in 2022. 2. Just above this area of consolidation posteriorly there is an ovoid opacity measuring 2.2 x 1.2 cm which was also noted on the 2 recent prior studies. This is probably either a rounded atelectasis or nodular scar but underlying neoplasm is possible. PET-CT is recommended for further study. 3. 5 mm posterior left lower lobe nodule unchanged over the last 2 studies but the area obscured by atelectasis in 2022. 4. Severe emphysema with diffuse bronchial thickening. 5. Cardiomegaly with small chronic pericardial effusion. 6. Aortic and coronary artery atherosclerosis. Chronic dilatation of the pulmonary arteries. 7. Stable mildly prominent mediastinal lymph nodes. 8. 1.9 cm  heterogeneous nodule in the left lobe of the thyroid  gland. Nonemergent ultrasound follow-up recommended. 9. Osteopenia, kyphodextroscoliosis and multiple chronic compression fractures. 10. Nonobstructing left renal  stones. Aortic Atherosclerosis (ICD10-I70.0) and Emphysema (ICD10-J43.9). Electronically Signed   By: Francis Quam M.D.   On: 09/27/2023 21:29    Assessment/Plan  Hematuria On eliquis  stable condition and vitals.  Differential includes UTI or other urinary issues. No nephrolithiasis history.  - Order urinalysis for hematuria and bacteriuria. - Treat presumptive UTI with cipro 250 mg bid x 7 days (has multiple allergies) - Consider urology referral if hematuria persists.  Constipation No current constipation concerns. Regular bowel movements reported. - Monitor bowel movements and encourage regularity.  Husband at bedside  Labs CBC BMP in am

## 2023-10-14 ENCOUNTER — Encounter: Payer: Self-pay | Admitting: Adult Health

## 2023-10-14 MED ORDER — CIPROFLOXACIN HCL 250 MG PO TABS: 250.0000 mg | ORAL_TABLET | Freq: Two times a day (BID) | ORAL | 0 refills | Status: AC

## 2023-10-15 DIAGNOSIS — Z79899 Other long term (current) drug therapy: Secondary | ICD-10-CM | POA: Diagnosis not present

## 2023-10-15 DIAGNOSIS — N390 Urinary tract infection, site not specified: Secondary | ICD-10-CM | POA: Diagnosis not present

## 2023-10-15 LAB — CBC AND DIFFERENTIAL
HCT: 41 (ref 36–46)
Hemoglobin: 13.3 (ref 12.0–16.0)
Platelets: 221 K/uL (ref 150–400)
WBC: 6.7

## 2023-10-15 LAB — BASIC METABOLIC PANEL WITH GFR
BUN: 34 — AB (ref 4–21)
CO2: 25 — AB (ref 13–22)
Chloride: 105 (ref 99–108)
Creatinine: 0.9 (ref 0.5–1.1)
Glucose: 80
Potassium: 4.9 meq/L (ref 3.5–5.1)
Sodium: 141 (ref 137–147)

## 2023-10-15 LAB — COMPREHENSIVE METABOLIC PANEL WITH GFR
Calcium: 9.2 (ref 8.7–10.7)
eGFR: 64

## 2023-10-15 LAB — CBC: RBC: 4.35 (ref 3.87–5.11)

## 2023-10-21 DIAGNOSIS — I69328 Other speech and language deficits following cerebral infarction: Secondary | ICD-10-CM | POA: Diagnosis not present

## 2023-10-21 DIAGNOSIS — R482 Apraxia: Secondary | ICD-10-CM | POA: Diagnosis not present

## 2023-10-21 DIAGNOSIS — I69322 Dysarthria following cerebral infarction: Secondary | ICD-10-CM | POA: Diagnosis not present

## 2023-10-23 DIAGNOSIS — I69322 Dysarthria following cerebral infarction: Secondary | ICD-10-CM | POA: Diagnosis not present

## 2023-10-23 DIAGNOSIS — I69328 Other speech and language deficits following cerebral infarction: Secondary | ICD-10-CM | POA: Diagnosis not present

## 2023-10-23 DIAGNOSIS — R482 Apraxia: Secondary | ICD-10-CM | POA: Diagnosis not present

## 2023-10-24 ENCOUNTER — Encounter: Payer: Self-pay | Admitting: Adult Health

## 2023-10-24 ENCOUNTER — Non-Acute Institutional Stay (SKILLED_NURSING_FACILITY): Payer: Self-pay | Admitting: Adult Health

## 2023-10-24 DIAGNOSIS — K5901 Slow transit constipation: Secondary | ICD-10-CM

## 2023-10-24 DIAGNOSIS — R310 Gross hematuria: Secondary | ICD-10-CM | POA: Diagnosis not present

## 2023-10-24 NOTE — Progress Notes (Signed)
 Location:  [facility] Room Number: 116-P Place of Service:  SNF [phone]) Provider:  Darlean Maus, NP   Patient Care Team: [name], MD as PCP - General (Internal Medicine) Jeffrie Oneil BROCKS, MD as PCP - Cardiology (Cardiology) [name], MD as PCP - Electrophysiology (Cardiology) Alix Charleston, MD as Consulting Physician (Neurosurgery)  Extended Emergency Contact Information Primary Emergency Contact: Poppen,[name] Address: [address]          [place] [phone] [place] Home Phone: [phone] Mobile Phone: [phone] Relation: Spouse Secondary Emergency Contact: Kahan,[name] Address: [address]          [place], [place] [address] [place] Mobile Phone: [phone] Relation: Son  Code Status:  Full Code Goals of care: Advanced Directive information    06/17/2023   10:31 AM  Advanced Directives  Does Patient Have a Medical Advance Directive? Yes  Type of Estate agent of [place];Living will  Does patient want to make changes to medical advance directive? No - Patient declined  Copy of Healthcare Power of Attorney in Chart? Yes - validated most recent copy scanned in chart (See row information)     Chief Complaint  Patient presents with   Constipation    Acute visit. Need for Shingles, Covid and Tetanus vaccines as well as an AWV.    HPI:  The patient is a 78 year old who presents with hematuria and abdominal distention.  She initially experienced hematuria on June 30th. Urinalysis revealed bacteria and blood, with negative nitrate and leukocyte esterase, and a white blood cell count of 13. Moderate bacteria were present. She was treated with Cipro  due to various allergies. Although the culture did not return, her symptoms, including blood in the urine, have resolved. No current urinary symptoms such as blood, burning, or other discomforts.  She  experienced abdominal distention and nausea, prompting a KUB 10/23/23, which revealed a large burden of stool. An enema was administered with good results. No current nausea or pain. She has a history of gallstones.  She sometimes struggles with taking Miralax  and Senna as prescribed, citing a dislike for taking many pills. She drinks at least two bottles of fortified water  daily, but her BUN levels suggest she could benefit from increased water  intake to help prevent UTIs and constipation. She refused her scheduled laxatives at times.  Past Medical History:  Diagnosis Date   Anxiety    Arthritis    some in my lower back; probably elbows, knees (11/18/2017)   Atrial fibrillation (HCC)    Bell's palsy    when pt. was 78 yrs old, when under stress the left side of face will droop.   Complication of anesthesia    vascular OR 2016; BP bottomed out; couldn't get it regulated; ended up in ICU for DAYS (11/18/2017)   GERD (gastroesophageal reflux disease)    History of kidney stones    Hypertension    Hypertrophic cardiomyopathy (HCC)    severe LV basilar hypertrophy witn no evidence of significant outflow tract obstruction, EF 65-70%, mild LAE, mild TR, grade 1a diastolic dysfunction 05/15/10 (Dr. Oneil Jeffrie) (Atrial Septal Hypertrophy pattern)-- Intra-op TEE with dsignificant outflow tract obstruction - AI, MR & TR   Insomnia    Mild aortic sclerosis    Osteopenia    Peripheral vascular disease (HCC)    Syncope    , Vagal   Past Surgical History:  Procedure Laterality Date  AUGMENTATION MAMMAPLASTY Bilateral    BACK SURGERY     CARDIAC CATHETERIZATION N/A 05/07/2016   Procedure: Left Heart Cath and Coronary Angiography;  Surgeon: Lonni JONETTA Cash, MD;  Location: [facility] INVASIVE CV LAB;  Service: Cardiovascular;  Laterality: N/A;   CARDIOVERSION N/A 09/24/2017   Procedure: CARDIOVERSION;  Surgeon: Rolan Ezra RAMAN, MD;  Location: [facility] ENDOSCOPY;  Service: Cardiovascular;  Laterality: N/A;    DILATION AND CURETTAGE OF UTERUS     ENDARTERECTOMY FEMORAL Right 03/02/2015   Procedure: ENDARTERECTOMY RIGHT FEMORAL;  Surgeon: Lonni RAMAN Blade, MD;  Location: [facility] OR;  Service: Vascular;  Laterality: Right;   ESOPHAGOGASTRODUODENOSCOPY (EGD) WITH PROPOFOL  N/A 12/01/2020   Procedure: ESOPHAGOGASTRODUODENOSCOPY (EGD) WITH PROPOFOL ;  Surgeon: Paola Dreama SAILOR, MD;  Location: [facility] ENDOSCOPY;  Service: General;  Laterality: N/A;   FACIAL COSMETIC SURGERY Left 2002   related to Bell's Palsy @ age 37; left eye/side of face droopy; tried to make area symmetrical   FEMORAL-POPLITEAL BYPASS GRAFT Right 03/02/2015   Procedure: BYPASS GRAFT FEMORAL-BELOW KNEE POPLITEAL ARTERY;  Surgeon: Lonni RAMAN Blade, MD;  Location: [facility] OR;  Service: Vascular;  Laterality: Right;   INGUINAL HERNIA REPAIR Bilateral 2002   IR ANGIO INTRA EXTRACRAN SEL COM CAROTID INNOMINATE UNI L MOD SED  11/16/2020   IR CT HEAD LTD  11/13/2020   IR PERCUTANEOUS ART THROMBECTOMY/INFUSION INTRACRANIAL INC DIAG ANGIO  11/13/2020   OVARIAN CYST REMOVAL Left    PEG PLACEMENT N/A 12/01/2020   Procedure: PERCUTANEOUS ENDOSCOPIC GASTROSTOMY (PEG) PLACEMENT;  Surgeon: Paola Dreama SAILOR, MD;  Location: [facility] ENDOSCOPY;  Service: General;  Laterality: N/A;   PERIPHERAL VASCULAR CATHETERIZATION N/A 01/16/2015   Procedure: Abdominal Aortogram;  Surgeon: Lonni RAMAN Blade, MD;  Location: [facility] INVASIVE CV LAB;  Service: Cardiovascular;  Laterality: N/A;   POSTERIOR LUMBAR FUSION  2015   have plates and screws in there   RADIOLOGY WITH ANESTHESIA N/A 11/13/2020   Procedure: IR WITH ANESTHESIA;  Surgeon: Radiologist, Medication, MD;  Location: [facility] OR;  Service: Radiology;  Laterality: N/A;   TONSILLECTOMY      Allergies  Allergen Reactions   Amoxicillin Other (See Comments)    UTI Has patient had a PCN reaction causing immediate rash, facial/tongue/throat swelling, SOB or lightheadedness with hypotension: No Has patient had a PCN reaction causing  severe rash involving mucus membranes or skin necrosis: No Has patient had a PCN reaction that required hospitalization: No Has patient had a PCN reaction occurring within the last 10 years: Yes--UTI ONLY If all of the above answers are NO, then may proceed with Cephalosporin use.    Atenolol Cough   Crestor  [Rosuvastatin  Calcium ] Other (See Comments)    Muscle aches   Pravastatin  Other (See Comments)    Muscle aches   Rosuvastatin  Calcium     Sulfa Antibiotics Nausea Only   Codeine Other (See Comments)    hallucinations    Outpatient Encounter Medications as of 10/24/2023  Medication Sig   acetaminophen  (TYLENOL ) 325 MG tablet Take 650 mg by mouth every 6 (six) hours as needed for mild pain or fever.   albuterol  (VENTOLIN  HFA) 108 (90 Base) MCG/ACT inhaler Inhale 2 puffs into the lungs every 4 (four) hours as needed for wheezing or shortness of breath.   amiodarone  (PACERONE ) 200 MG tablet Take 100 mg by mouth daily. May give crushed   amLODipine  (NORVASC ) 5 MG tablet Take 2.5 mg by mouth daily.   apixaban  (ELIQUIS ) 5 MG TABS tablet Take 5 mg by mouth 2 (two) times  daily. May give crushed   Benzonatate  (TESSALON  PERLES PO) Take 200 mg by mouth as needed. 200mg , oral as needed, Tessalon  Perles, 200mg , PO TID PRN cough   bisacodyl  (DULCOLAX) 10 MG suppository Place 10 mg rectally daily as needed for moderate constipation.   bisacodyl  (DULCOLAX) 5 MG EC tablet Take 5 mg by mouth 2 (two) times daily as needed for moderate constipation.   buPROPion  (WELLBUTRIN  XL) 150 MG 24 hr tablet Take 150 mg by mouth daily.   diclofenac Sodium (VOLTAREN) 1 % GEL Apply one application to right hip and knee four times daily as needed.   Emollient (CERAVE) CREA Apply 1 Application topically as needed.   Eyelid Cleansers (OCUSOFT EYELID CLEANSING) PADS Apply 1 Application topically daily.   ezetimibe  (ZETIA ) 10 MG tablet Take 10 mg by mouth daily. Oral if crushed   furosemide  (LASIX ) 20 MG tablet Take 20  mg by mouth daily as needed. Give Lasix  20mg  for weight gain equal or greater than 3 pounds   hydrALAZINE  (APRESOLINE ) 10 MG tablet Take 10 mg by mouth 3 (three) times daily as needed (SBP > 165 or DBP > 95).   hydrocortisone cream 1 % Apply 1 Application topically 2 (two) times daily as needed for itching.   ipratropium-albuterol  (DUONEB) 0.5-2.5 (3) MG/3ML SOLN Inhale 3 mLs into the lungs every 6 (six) hours as needed.   ketoconazole  (NIZORAL ) 2 % cream Apply 1 application  topically daily as needed for irritation.   ondansetron  (ZOFRAN ) 4 MG tablet Take 4 mg by mouth every 6 (six) hours as needed for nausea or vomiting.   OXYGEN  Inhale 3 L into the lungs as directed. to maintain sats > or equal to 90%. May titrate Three Times A Day; 07:00 AM - 03:00 PM, 03:00 PM - 11:00 PM, 11:00 PM - 7:00 am   pantoprazole  (PROTONIX ) 40 MG tablet Take 40 mg by mouth 2 (two) times daily.   Polyethyl Glycol-Propyl Glycol (SYSTANE) 0.4-0.3 % SOLN Apply 2 drops to eye every 6 (six) hours as needed (dry eyes).   polyethylene glycol powder (GLYCOLAX /MIRALAX ) 17 GM/SCOOP powder Take 17 g by mouth every other day.   sennosides-docusate sodium  (SENOKOT-S) 8.6-50 MG tablet Take 2 tablets by mouth 2 (two) times daily.   Tiotropium Bromide-Olodaterol (STIOLTO RESPIMAT ) 2.5-2.5 MCG/ACT AERS Inhale 2 puffs into the lungs daily.   zinc oxide 20 % ointment Apply 1 Application topically 2 (two) times daily as needed for irritation.   No facility-administered encounter medications on file as of 10/24/2023.    Review of Systems  Constitutional:  Negative for activity change, appetite change, chills, diaphoresis, fatigue, fever and unexpected weight change.  HENT:  Negative for congestion.   Respiratory:  Positive for shortness of breath (on exertion chronic). Negative for cough and wheezing.   Cardiovascular:  Negative for chest pain, palpitations and leg swelling.  Gastrointestinal:  Positive for abdominal distention and  constipation. Negative for abdominal pain and diarrhea.  Genitourinary:  Negative for decreased urine volume, difficulty urinating, dysuria, flank pain, frequency, hematuria, menstrual problem, pelvic pain, urgency, vaginal bleeding, vaginal discharge and vaginal pain.  Musculoskeletal:  Positive for gait problem. Negative for arthralgias, back pain, joint swelling and myalgias.  Neurological:  Positive for speech difficulty and weakness. Negative for dizziness, tremors, seizures, syncope, facial asymmetry, light-headedness, numbness and headaches.  Psychiatric/Behavioral:  Negative for agitation, behavioral problems and confusion.     Immunization History  Administered Date(s) Administered   Fluad Quad(high Dose 65+) 02/05/2022, 01/08/2023  Fluzone Influenza virus vaccine,trivalent (IIV3), split virus 04/30/2010, 01/26/2016, 02/07/2018, 02/01/2020   Influenza Split 01/16/2009   Influenza, High Dose Seasonal PF 02/07/2018, 01/25/2019, 02/28/2021, 02/05/2022   Influenza,inj,Quad PF,6+ Mos 03/06/2012, 01/26/2016, 01/10/2017   Influenza,inj,quad, With Preservative 02/14/2015   Moderna Covid-19 Vaccine Bivalent Booster 11yrs & up 02/28/2021, 02/04/2023   PFIZER(Purple Top)SARS-COV-2 Vaccination 05/06/2019, 05/27/2019, 01/27/2020, 09/18/2020   Pneumococcal Conjugate-13 03/19/2017   Pneumococcal Polysaccharide-23 02/14/2015   Respiratory Syncytial Virus Vaccine,Recomb Aduvanted(Arexvy) 02/15/2022   Td 02/25/2005   Tdap 05/19/2013   Zoster, Live 07/28/2006   Pertinent  Health Maintenance Due  Topic Date Due   INFLUENZA VACCINE  11/14/2023   DEXA SCAN  Discontinued      05/07/2022   11:59 AM 08/13/2022   11:02 AM 11/26/2022   12:34 PM 12/31/2022    3:26 PM 03/11/2023    2:48 PM  Fall Risk  Falls in the past year? 0 1 0 0 1  Was there an injury with Fall? 0 0 0 0 0  Fall Risk Category Calculator 0 1 0 0 1  Patient at Risk for Falls Due to No Fall Risks History of fall(s);Impaired  balance/gait;Impaired mobility History of fall(s);Impaired balance/gait History of fall(s);Impaired balance/gait;Impaired mobility History of fall(s);Impaired balance/gait;Impaired mobility  Patient at Risk for Falls Due to - Comments  right sided hemiparesis     Fall risk Follow up Falls evaluation completed  Falls evaluation completed;Education provided;Falls prevention discussed Falls evaluation completed;Education provided Falls evaluation completed;Education provided;Falls prevention discussed Falls evaluation completed;Education provided;Falls prevention discussed     Data saved with a previous flowsheet row definition   Functional Status Survey:    Vitals:   10/24/23 0844 10/24/23 0846  BP: (!) 164/79 (!) 143/65  Pulse: 63   Temp: (!) 96.8 F (36 C)   SpO2: 92%   Weight: 85 lb 6.4 oz (38.7 kg)   Height: 5' 4 (1.626 m)    Body mass index is 14.66 kg/m. Physical Exam Vitals and nursing note reviewed.  Constitutional:      General: She is not in acute distress.    Appearance: Normal appearance. She is not diaphoretic.  HENT:     Head: Normocephalic and atraumatic.  Neck:     Thyroid : No thyromegaly.     Vascular: No JVD.  Cardiovascular:     Rate and Rhythm: Normal rate and regular rhythm.     Heart sounds: Normal heart sounds. No murmur heard. Pulmonary:     Effort: Pulmonary effort is normal. No respiratory distress.     Breath sounds: No stridor.     Comments: Decreased throughout Abdominal:     General: Bowel sounds are normal. There is no distension.     Palpations: Abdomen is soft.     Tenderness: There is no abdominal tenderness. There is no right CVA tenderness or left CVA tenderness.  Musculoskeletal:     Cervical back: No muscular tenderness.     Right lower leg: No edema.     Left lower leg: No edema.  Skin:    General: Skin is warm and dry.  Neurological:     Mental Status: She is alert and oriented to person, place, and time. Mental status is at  baseline.     Comments: Right sided weakness  Psychiatric:        Mood and Affect: Mood normal.     Labs reviewed: Recent Labs    03/18/23 0000 05/21/23 0000 10/15/23 0000  NA 140 142 141  K 4.4  4.2 4.9  CL 106 107 105  CO2 22 25* 25*  BUN 18 24* 34*  CREATININE 0.8 1.0 0.9  CALCIUM  10.0 8.9 9.2   Recent Labs    03/18/23 0000 05/21/23 0000 06/24/23 0000  AST 27 30 30   ALT 23 35 30  ALKPHOS 96 93 95  ALBUMIN  4.2 3.4* 3.0*   Recent Labs    03/18/23 0000 05/21/23 0000 10/15/23 0000  WBC 4.0 7.2 6.7  HGB 12.5 13.6 13.3  HCT 38 41 41  PLT 180 246 221   Lab Results  Component Value Date   TSH 0.61 08/08/2022   Lab Results  Component Value Date   HGBA1C 6.0 (H) 11/13/2020   Lab Results  Component Value Date   CHOL 164 03/18/2023   HDL 61 03/18/2023   LDLCALC 67 03/18/2023   TRIG 179 (A) 03/18/2023   CHOLHDL 3.0 11/13/2020    Significant Diagnostic Results in last 30 days:  No results found.  Assessment/Plan  Constipation Abdominal distention and nausea linked to large stool burden. Enema provided relief. Regular bowel movements essential to prevent complications. - Encouraged regular use of Miralax  and Senna for bowel regularity. - Aim for bowel movement every 1 to 3 days. - Increase water  intake to prevent constipation.  Urinary Tract Infection (UTI) Hematuria and bacteriuria treated with Cipro . Symptoms resolved. Risk for UTIs due to incontinence and postmenopausal status. - Repeat urine test two weeks post-antibiotics to ensure infection resolution. - Educated on proper hygiene practices. - Encourage increased water  intake to prevent UTIs.  General Health Maintenance Normal kidney function and hemoglobin. Slightly elevated BUN indicates need for increased hydration. - Encourage increased water  intake to improve BUN levels and hydration.  Labs/tests ordered:  UA C and S repeat next week

## 2023-10-27 DIAGNOSIS — I6602 Occlusion and stenosis of left middle cerebral artery: Secondary | ICD-10-CM | POA: Diagnosis not present

## 2023-10-27 DIAGNOSIS — I69351 Hemiplegia and hemiparesis following cerebral infarction affecting right dominant side: Secondary | ICD-10-CM | POA: Diagnosis not present

## 2023-10-27 DIAGNOSIS — M62421 Contracture of muscle, right upper arm: Secondary | ICD-10-CM | POA: Diagnosis not present

## 2023-11-03 ENCOUNTER — Non-Acute Institutional Stay (SKILLED_NURSING_FACILITY): Payer: Self-pay | Admitting: Internal Medicine

## 2023-11-03 DIAGNOSIS — M545 Low back pain, unspecified: Secondary | ICD-10-CM | POA: Diagnosis not present

## 2023-11-03 DIAGNOSIS — G8191 Hemiplegia, unspecified affecting right dominant side: Secondary | ICD-10-CM

## 2023-11-03 DIAGNOSIS — I69351 Hemiplegia and hemiparesis following cerebral infarction affecting right dominant side: Secondary | ICD-10-CM | POA: Diagnosis not present

## 2023-11-03 DIAGNOSIS — I6602 Occlusion and stenosis of left middle cerebral artery: Secondary | ICD-10-CM | POA: Diagnosis not present

## 2023-11-03 DIAGNOSIS — M62421 Contracture of muscle, right upper arm: Secondary | ICD-10-CM | POA: Diagnosis not present

## 2023-11-03 NOTE — Progress Notes (Signed)
 Location: Medical illustrator of Service:  SNF (31)  Provider:   Code Status:  Goals of Care:     06/17/2023   10:31 AM  Advanced Directives  Does Patient Have a Medical Advance Directive? Yes  Type of Estate agent of [place];Living will  Does patient want to make changes to medical advance directive? No - Patient declined  Copy of Healthcare Power of Attorney in Chart? Yes - validated most recent copy scanned in chart (See row information)     Chief Complaint  Patient presents with   Acute Visit    HPI: Patient is a 78 y.o. 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 was 78 yrs old, when under stress the left side of face will droop.   Complication of anesthesia    vascular OR 2016; BP bottomed out; couldn't get it regulated; ended up in ICU for DAYS (11/18/2017)   GERD (gastroesophageal reflux disease)    History of kidney stones    Hypertension    Hypertrophic cardiomyopathy (HCC)    severe LV basilar hypertrophy witn no evidence of significant outflow tract obstruction, EF 65-70%, mild LAE, mild TR, grade 1a diastolic dysfunction 05/15/10 (Dr. Oneil Parchment) (Atrial Septal Hypertrophy pattern)-- Intra-op TEE with dsignificant outflow tract  obstruction - AI, MR & TR   Insomnia    Mild aortic sclerosis    Osteopenia    Peripheral vascular disease (HCC)    Syncope    , Vagal    Past Surgical History:  Procedure Laterality Date   AUGMENTATION MAMMAPLASTY Bilateral    BACK SURGERY     CARDIAC CATHETERIZATION N/A 05/07/2016   Procedure: Left Heart Cath and Coronary Angiography;  Surgeon: Lonni JONETTA Cash, MD;  [facility] INVASIVE CV LAB;  Service: Cardiovascular;  Laterality: N/A;   CARDIOVERSION N/A 09/24/2017   Procedure: CARDIOVERSION;  Surgeon: Rolan Ezra RAMAN, MD;  Location: [facility] ENDOSCOPY;  Service: Cardiovascular;  Laterality: N/A;   DILATION AND CURETTAGE OF UTERUS     ENDARTERECTOMY FEMORAL Right 03/02/2015   Procedure: ENDARTERECTOMY RIGHT FEMORAL;  Surgeon: Lonni RAMAN Blade, MD;  Location: [facility] OR;  Service: Vascular;  Laterality: Right;   ESOPHAGOGASTRODUODENOSCOPY (EGD) WITH PROPOFOL  N/A 12/01/2020   Procedure: ESOPHAGOGASTRODUODENOSCOPY (EGD) WITH PROPOFOL ;  Surgeon: Paola Dreama SAILOR, MD;  Location: [facility] ENDOSCOPY;  Service: General;  Laterality: N/A;   FACIAL COSMETIC SURGERY Left 2002   related to Bell's Palsy @ age 64; left eye/side of face droopy; tried to make area symmetrical   FEMORAL-POPLITEAL BYPASS GRAFT Right 03/02/2015   Procedure: BYPASS GRAFT FEMORAL-BELOW KNEE POPLITEAL  ARTERY;  Surgeon: Lonni GORMAN Blade, MD;  Location: [facility] OR;  Service: Vascular;  Laterality: Right;   INGUINAL HERNIA REPAIR Bilateral 2002   IR ANGIO INTRA EXTRACRAN SEL COM CAROTID INNOMINATE UNI L MOD SED  11/16/2020   IR CT HEAD LTD  11/13/2020   IR PERCUTANEOUS ART THROMBECTOMY/INFUSION INTRACRANIAL INC DIAG ANGIO  11/13/2020   OVARIAN CYST REMOVAL Left    PEG PLACEMENT N/A 12/01/2020   Procedure: PERCUTANEOUS ENDOSCOPIC GASTROSTOMY (PEG) PLACEMENT;  Surgeon: Paola Dreama SAILOR, MD;  Location: [facility] ENDOSCOPY;  Service: General;  Laterality: N/A;   PERIPHERAL VASCULAR CATHETERIZATION N/A 01/16/2015   Procedure: Abdominal  Aortogram;  Surgeon: Lonni GORMAN Blade, MD;  Location: [facility] INVASIVE CV LAB;  Service: Cardiovascular;  Laterality: N/A;   POSTERIOR LUMBAR FUSION  2015   have plates and screws in there   RADIOLOGY WITH ANESTHESIA N/A 11/13/2020   Procedure: IR WITH ANESTHESIA;  Surgeon: Radiologist, Medication, MD;  Location: [facility] OR;  Service: Radiology;  Laterality: N/A;   TONSILLECTOMY      Allergies  Allergen Reactions   Amoxicillin Other (See Comments)    UTI Has patient had a PCN reaction causing immediate rash, facial/tongue/throat swelling, SOB or lightheadedness with hypotension: No Has patient had a PCN reaction causing severe rash involving mucus membranes or skin necrosis: No Has patient had a PCN reaction that required hospitalization: No Has patient had a PCN reaction occurring within the last 10 years: Yes--UTI ONLY If all of the above answers are NO, then may proceed with Cephalosporin use.    Atenolol Cough   Crestor  [Rosuvastatin  Calcium ] Other (See Comments)    Muscle aches   Pravastatin  Other (See Comments)    Muscle aches   Rosuvastatin  Calcium     Sulfa Antibiotics Nausea Only   Codeine Other (See Comments)    hallucinations    Outpatient Encounter Medications as of 11/03/2023  Medication Sig   acetaminophen  (TYLENOL ) 325 MG tablet Take 650 mg by mouth every 6 (six) hours as needed for mild pain or fever.   albuterol  (VENTOLIN  HFA) 108 (90 Base) MCG/ACT inhaler Inhale 2 puffs into the lungs every 4 (four) hours as needed for wheezing or shortness of breath.   amiodarone  (PACERONE ) 200 MG tablet Take 100 mg by mouth daily. May give crushed   amLODipine  (NORVASC ) 5 MG tablet Take 2.5 mg by mouth daily.   apixaban  (ELIQUIS ) 5 MG TABS tablet Take 5 mg by mouth 2 (two) times daily. May give crushed   Benzonatate  (TESSALON  PERLES PO) Take 200 mg by mouth as needed. 200mg , oral as needed, Tessalon  Perles, 200mg , PO TID PRN cough   bisacodyl  (DULCOLAX) 10 MG suppository Place 10  mg rectally daily as needed for moderate constipation.   bisacodyl  (DULCOLAX) 5 MG EC tablet Take 5 mg by mouth 2 (two) times daily as needed for moderate constipation.   buPROPion  (WELLBUTRIN  XL) 150 MG 24 hr tablet Take 150 mg by mouth daily.   diclofenac Sodium (VOLTAREN) 1 % GEL Apply one application to right hip and knee four times daily as needed.   Emollient (CERAVE) CREA Apply 1 Application topically as needed.   Eyelid Cleansers (OCUSOFT EYELID CLEANSING) PADS Apply 1 Application topically daily.   ezetimibe  (ZETIA ) 10 MG tablet Take 10 mg by mouth daily. Oral if crushed   furosemide  (LASIX ) 20 MG tablet Take 20 mg by mouth daily as needed. Give Lasix  20mg  for weight gain equal or greater than 3 pounds   hydrALAZINE  (APRESOLINE ) 10 MG  tablet Take 10 mg by mouth 3 (three) times daily as needed (SBP > 165 or DBP > 95).   hydrocortisone cream 1 % Apply 1 Application topically 2 (two) times daily as needed for itching.   ipratropium-albuterol  (DUONEB) 0.5-2.5 (3) MG/3ML SOLN Inhale 3 mLs into the lungs every 6 (six) hours as needed.   ketoconazole  (NIZORAL ) 2 % cream Apply 1 application  topically daily as needed for irritation.   ondansetron  (ZOFRAN ) 4 MG tablet Take 4 mg by mouth every 6 (six) hours as needed for nausea or vomiting.   OXYGEN  Inhale 3 L into the lungs as directed. to maintain sats > or equal to 90%. May titrate Three Times A Day; 07:00 AM - 03:00 PM, 03:00 PM - 11:00 PM, 11:00 PM - 7:00 am   pantoprazole  (PROTONIX ) 40 MG tablet Take 40 mg by mouth 2 (two) times daily.   Polyethyl Glycol-Propyl Glycol (SYSTANE) 0.4-0.3 % SOLN Apply 2 drops to eye every 6 (six) hours as needed (dry eyes).   polyethylene glycol powder (GLYCOLAX /MIRALAX ) 17 GM/SCOOP powder Take 17 g by mouth every other day.   sennosides-docusate sodium  (SENOKOT-S) 8.6-50 MG tablet Take 2 tablets by mouth 2 (two) times daily.   Tiotropium Bromide-Olodaterol (STIOLTO RESPIMAT ) 2.5-2.5 MCG/ACT AERS Inhale 2 puffs  into the lungs daily.   zinc oxide 20 % ointment Apply 1 Application topically 2 (two) times daily as needed for irritation.   No facility-administered encounter medications on file as of 11/03/2023.    Review of Systems:  Review of Systems  Constitutional:  Positive for activity change. Negative for appetite change.  HENT: Negative.    Respiratory:  Negative for cough and shortness of breath.   Cardiovascular:  Negative for leg swelling.  Gastrointestinal:  Negative for constipation.  Genitourinary: Negative.   Musculoskeletal:  Positive for back pain and gait problem. Negative for arthralgias and myalgias.  Skin: Negative.   Neurological:  Negative for dizziness and weakness.  Psychiatric/Behavioral:  Negative for confusion, dysphoric mood and sleep disturbance.     Health Maintenance  Topic Date Due   Zoster Vaccines- Shingrix (1 of 2) 04/24/1964   COVID-19 Vaccine (7 - 2024-25 season) 04/01/2023   DTaP/Tdap/Td (3 - Td or Tdap) 05/20/2023   Medicare Annual Wellness (AWV)  08/13/2023   INFLUENZA VACCINE  11/14/2023   Lung Cancer Screening  09/21/2024   Pneumococcal Vaccine: 50+ Years  Completed   Hepatitis C Screening  Completed   Hepatitis B Vaccines  Aged Out   HPV VACCINES  Aged Out   Meningococcal B Vaccine  Aged Out   DEXA SCAN  Discontinued    Physical Exam: There were no vitals filed for this visit. There is no height or weight on file to calculate BMI. Physical Exam Vitals reviewed.  Constitutional:      Appearance: Normal appearance.  HENT:     Head: Normocephalic.     Nose: Nose normal.     Mouth/Throat:     Mouth: Mucous membranes are moist.     Pharynx: Oropharynx is clear.  Eyes:     Pupils: Pupils are equal, round, and reactive to light.  Cardiovascular:     Rate and Rhythm: Normal rate and regular rhythm.     Pulses: Normal pulses.     Heart sounds: Normal heart sounds. No murmur heard. Pulmonary:     Effort: Pulmonary effort is normal.      Breath sounds: Normal breath sounds.  Abdominal:     General: Abdomen  is flat. Bowel sounds are normal.     Palpations: Abdomen is soft.  Musculoskeletal:        General: No swelling.     Cervical back: Neck supple.     Comments: Tender in Upper lower Back Mid area No Bruise  Skin:    General: Skin is warm.  Neurological:     Mental Status: She is alert and oriented to person, place, and time.     Comments: Has Right Hemiplegia Also has aphasia   Psychiatric:        Mood and Affect: Mood normal.        Thought Content: Thought content normal.     Labs reviewed: Basic Metabolic Panel: Recent Labs    03/18/23 0000 05/21/23 0000 10/15/23 0000  NA 140 142 141  K 4.4 4.2 4.9  CL 106 107 105  CO2 22 25* 25*  BUN 18 24* 34*  CREATININE 0.8 1.0 0.9  CALCIUM  10.0 8.9 9.2   Liver Function Tests: Recent Labs    03/18/23 0000 05/21/23 0000 06/24/23 0000  AST 27 30 30   ALT 23 35 30  ALKPHOS 96 93 95  ALBUMIN  4.2 3.4* 3.0*   No results for input(s): LIPASE, AMYLASE in the last 8760 hours. No results for input(s): AMMONIA in the last 8760 hours. CBC: Recent Labs    03/18/23 0000 05/21/23 0000 10/15/23 0000  WBC 4.0 7.2 6.7  HGB 12.5 13.6 13.3  HCT 38 41 41  PLT 180 246 221   Lipid Panel: Recent Labs    03/18/23 0000  CHOL 164  HDL 61  LDLCALC 67  TRIG 179*   Lab Results  Component Value Date   HGBA1C 6.0 (H) 11/13/2020    Procedures since last visit: No results found.  Assessment/Plan 1. Acute midline low back pain without sciatica (Primary) Xray of Lower Lumbar Region Rule out Compression Fracture No falls Tylenol  650 mg TID for now Robaxin  250 mg every day PRn     Labs/tests ordered:  * No order type specified * Next appt:  Visit date not found

## 2023-11-04 ENCOUNTER — Encounter: Payer: Self-pay | Admitting: Internal Medicine

## 2023-11-04 ENCOUNTER — Non-Acute Institutional Stay (SKILLED_NURSING_FACILITY): Payer: Self-pay | Admitting: Internal Medicine

## 2023-11-04 DIAGNOSIS — M8000XS Age-related osteoporosis with current pathological fracture, unspecified site, sequela: Secondary | ICD-10-CM

## 2023-11-04 DIAGNOSIS — M545 Low back pain, unspecified: Secondary | ICD-10-CM

## 2023-11-04 NOTE — Progress Notes (Signed)
 Location:  [facility] Room Number: 116 P Place of Service:  SNF [phone]) Provider:  [name], Li   Beverly Fredia CROME, Li  Patient Care Team: Beverly Fredia CROME, Li as PCP - General (Internal Medicine) Beverly Oneil BROCKS, Li as PCP - Cardiology (Cardiology) Beverly Elspeth BROCKS, Li as PCP - Electrophysiology (Cardiology) Beverly Charleston, Li as Consulting Physician (Neurosurgery)  Extended Emergency Contact Information Primary Emergency Contact: Beverly Li Address: [address]          [place] [id number] [place] Home Phone: [phone] Mobile Phone: [phone] Relation: Spouse Secondary Emergency Contact: Beverly Li Address: [address]          [place], [place] [address] [place] Mobile Phone: [phone] Relation: Son  Code Status:  Full Code Goals of care: Advanced Directive information    06/17/2023   10:31 AM  Advanced Directives  Does Patient Have a Medical Advance Directive? Yes  Type of Estate agent of [place];Living will  Does patient want to make changes to medical advance directive? No - Patient declined  Copy of Healthcare Power of Attorney in Chart? Yes - validated most recent copy scanned in chart (See row information)     Chief Complaint  Patient presents with   Acute Visit    HPI:  Pt is a 78 y.o. 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 was 78 yrs old, when under stress the left side of face will droop.   Complication of anesthesia    vascular OR 2016; BP bottomed out; couldn't get it regulated; ended up in ICU for DAYS (11/18/2017)   GERD (gastroesophageal reflux disease)    History of kidney stones    Hypertension    Hypertrophic cardiomyopathy (HCC)    severe LV basilar hypertrophy witn no evidence of significant outflow tract obstruction, EF 65-70%, mild LAE, mild TR, grade 1a diastolic dysfunction 05/15/10 (Dr. Oneil Beverly) (Atrial Septal Hypertrophy pattern)-- Intra-op TEE with dsignificant outflow tract obstruction - AI, MR & TR   Insomnia    Mild aortic sclerosis    Osteopenia    Peripheral vascular disease (HCC)    Syncope    , Vagal   Past Surgical History:  Procedure Laterality Date   AUGMENTATION MAMMAPLASTY Bilateral    BACK SURGERY     CARDIAC CATHETERIZATION N/A 05/07/2016   Procedure: Left Heart Cath and Coronary Angiography;  Surgeon: Beverly JONETTA Cash, Li;  Location: [facility] INVASIVE CV LAB;  Service: Cardiovascular;  Laterality: N/A;   CARDIOVERSION N/A 09/24/2017   Procedure: CARDIOVERSION;  Surgeon: Beverly Ezra RAMAN, Li;  Location: [facility] ENDOSCOPY;  Service: Cardiovascular;  Laterality: N/A;  DILATION AND CURETTAGE OF UTERUS     ENDARTERECTOMY FEMORAL Right 03/02/2015   Procedure: ENDARTERECTOMY RIGHT FEMORAL;  Surgeon: Beverly GORMAN Blade, Li;  Location: [facility] OR;  Service: Vascular;  Laterality: Right;   ESOPHAGOGASTRODUODENOSCOPY (EGD) WITH PROPOFOL  N/A 12/01/2020   Procedure: ESOPHAGOGASTRODUODENOSCOPY (EGD) WITH PROPOFOL ;  Surgeon: Beverly Dreama SAILOR, Li;  Location: [facility] ENDOSCOPY;  Service: General;  Laterality: N/A;   FACIAL COSMETIC SURGERY Left 2002   related to Bell's Palsy @ age 75; left eye/side of face droopy; tried to make area symmetrical   FEMORAL-POPLITEAL BYPASS GRAFT Right  03/02/2015   Procedure: BYPASS GRAFT FEMORAL-BELOW KNEE POPLITEAL ARTERY;  Surgeon: Beverly GORMAN Blade, Li;  Location: [facility] OR;  Service: Vascular;  Laterality: Right;   INGUINAL HERNIA REPAIR Bilateral 2002   IR ANGIO INTRA EXTRACRAN SEL COM CAROTID INNOMINATE UNI L MOD SED  11/16/2020   IR CT HEAD LTD  11/13/2020   IR PERCUTANEOUS ART THROMBECTOMY/INFUSION INTRACRANIAL INC DIAG ANGIO  11/13/2020   OVARIAN CYST REMOVAL Left    PEG PLACEMENT N/A 12/01/2020   Procedure: PERCUTANEOUS ENDOSCOPIC GASTROSTOMY (PEG) PLACEMENT;  Surgeon: Beverly Dreama SAILOR, Li;  Location: [facility] ENDOSCOPY;  Service: General;  Laterality: N/A;   PERIPHERAL VASCULAR CATHETERIZATION N/A 01/16/2015   Procedure: Abdominal Aortogram;  Surgeon: Beverly GORMAN Blade, Li;  Location: [facility] INVASIVE CV LAB;  Service: Cardiovascular;  Laterality: N/A;   POSTERIOR LUMBAR FUSION  2015   have plates and screws in there   RADIOLOGY WITH ANESTHESIA N/A 11/13/2020   Procedure: IR WITH ANESTHESIA;  Surgeon: Beverly Li;  Location: [facility] OR;  Service: Radiology;  Laterality: N/A;   TONSILLECTOMY      Allergies  Allergen Reactions   Amoxicillin Other (See Comments)    UTI Has patient had a PCN reaction causing immediate rash, facial/tongue/throat swelling, SOB or lightheadedness with hypotension: No Has patient had a PCN reaction causing severe rash involving mucus membranes or skin necrosis: No Has patient had a PCN reaction that required hospitalization: No Has patient had a PCN reaction occurring within the last 10 years: Yes--UTI ONLY If all of the above answers are NO, then may proceed with Cephalosporin use.    Atenolol Cough   Crestor  [Rosuvastatin  Calcium ] Other (See Comments)    Muscle aches   Pravastatin  Other (See Comments)    Muscle aches   Rosuvastatin  Calcium     Sulfa Antibiotics Nausea Only   Codeine Other (See Comments)    hallucinations    Outpatient Encounter Medications as of 11/04/2023  Medication Sig    acetaminophen  (TYLENOL ) 325 MG tablet Take 650 mg by mouth every 6 (six) hours as needed for mild pain or fever.   albuterol  (VENTOLIN  HFA) 108 (90 Base) MCG/ACT inhaler Inhale 2 puffs into the lungs every 4 (four) hours as needed for wheezing or shortness of breath.   amiodarone  (PACERONE ) 200 MG tablet Take 100 mg by mouth daily. May give crushed   amLODipine  (NORVASC ) 5 MG tablet Take 2.5 mg by mouth daily.   apixaban  (ELIQUIS ) 5 MG TABS tablet Take 5 mg by mouth 2 (two) times daily. May give crushed   Benzonatate  (TESSALON  PERLES PO) Take 200 mg by mouth as needed. 200mg , oral as needed, Tessalon  Perles, 200mg , PO TID PRN cough   bisacodyl  (DULCOLAX) 10 MG suppository Place 10 mg rectally daily as needed for moderate constipation.   bisacodyl  (DULCOLAX) 5 MG EC tablet Take 5 mg by mouth 2 (two) times daily as needed for moderate constipation.   buPROPion  (  WELLBUTRIN  XL) 150 MG 24 hr tablet Take 150 mg by mouth daily.   diclofenac Sodium (VOLTAREN) 1 % GEL Apply one application to right hip and knee four times daily as needed.   Emollient (CERAVE) CREA Apply 1 Application topically as needed.   Eyelid Cleansers (OCUSOFT EYELID CLEANSING) PADS Apply 1 Application topically daily.   ezetimibe  (ZETIA ) 10 MG tablet Take 10 mg by mouth daily. Oral if crushed   furosemide  (LASIX ) 20 MG tablet Take 20 mg by mouth daily as needed. Give Lasix  20mg  for weight gain equal or greater than 3 pounds   hydrALAZINE  (APRESOLINE ) 10 MG tablet Take 10 mg by mouth 3 (three) times daily as needed (SBP > 165 or DBP > 95).   hydrocortisone cream 1 % Apply 1 Application topically 2 (two) times daily as needed for itching.   ipratropium-albuterol  (DUONEB) 0.5-2.5 (3) MG/3ML SOLN Inhale 3 mLs into the lungs every 6 (six) hours as needed.   ketoconazole  (NIZORAL ) 2 % cream Apply 1 application  topically daily as needed for irritation.   methocarbamol  (ROBAXIN ) 500 MG tablet Take 250 mg by mouth daily. (Patient taking  differently: Take 250 mg by mouth daily. Give 250 mg by mouth every 24 hours as needed for pain)   ondansetron  (ZOFRAN ) 4 MG tablet Take 4 mg by mouth every 6 (six) hours as needed for nausea or vomiting.   OXYGEN  Inhale 3 L into the lungs as directed. to maintain sats > or equal to 90%. May titrate Three Times A Day; 07:00 AM - 03:00 PM, 03:00 PM - 11:00 PM, 11:00 PM - 7:00 am   pantoprazole  (PROTONIX ) 40 MG tablet Take 40 mg by mouth 2 (two) times daily.   Polyethyl Glycol-Propyl Glycol (SYSTANE) 0.4-0.3 % SOLN Apply 2 drops to eye every 6 (six) hours as needed (dry eyes).   polyethylene glycol powder (GLYCOLAX /MIRALAX ) 17 GM/SCOOP powder Take 17 Li by mouth every other day.   sennosides-docusate sodium  (SENOKOT-S) 8.6-50 MG tablet Take 2 tablets by mouth 2 (two) times daily.   Tiotropium Bromide-Olodaterol (STIOLTO RESPIMAT ) 2.5-2.5 MCG/ACT AERS Inhale 2 puffs into the lungs daily.   zinc oxide 20 % ointment Apply 1 Application topically 2 (two) times daily as needed for irritation.   No facility-administered encounter medications on file as of 11/04/2023.    Review of Systems  Constitutional:  Negative for activity change and appetite change.  HENT: Negative.    Respiratory:  Negative for cough and shortness of breath.   Cardiovascular:  Negative for leg swelling.  Gastrointestinal:  Negative for constipation.  Genitourinary: Negative.   Musculoskeletal:  Positive for back pain and gait problem. Negative for arthralgias and myalgias.  Skin: Negative.   Neurological:  Negative for dizziness and weakness.  Psychiatric/Behavioral:  Negative for confusion, dysphoric mood and sleep disturbance.     Immunization History  Administered Date(s) Administered   Fluad Quad(high Dose 65+) 02/05/2022, 01/08/2023   Fluzone Influenza virus vaccine,trivalent (IIV3), split virus 04/30/2010, 01/26/2016, 02/07/2018, 02/01/2020   Influenza Split 01/16/2009   Influenza, High Dose Seasonal PF 02/07/2018,  01/25/2019, 02/28/2021, 02/05/2022   Influenza,inj,Quad PF,6+ Mos 03/06/2012, 01/26/2016, 01/10/2017   Influenza,inj,quad, With Preservative 02/14/2015   Moderna Covid-19 Vaccine Bivalent Booster 88yrs & up 02/28/2021, 02/04/2023   PFIZER(Purple Top)SARS-COV-2 Vaccination 05/06/2019, 05/27/2019, 01/27/2020, 09/18/2020   Pneumococcal Conjugate-13 03/19/2017   Pneumococcal Polysaccharide-23 02/14/2015   Respiratory Syncytial Virus Vaccine,Recomb Aduvanted(Arexvy) 02/15/2022   Td 02/25/2005   Tdap 05/19/2013   Zoster, Live 07/28/2006   Pertinent  Health Maintenance Due  Topic Date Due   INFLUENZA VACCINE  11/14/2023   DEXA SCAN  Discontinued      05/07/2022   11:59 AM 08/13/2022   11:02 AM 11/26/2022   12:34 PM 12/31/2022    3:26 PM 03/11/2023    2:48 PM  Fall Risk  Falls in the past year? 0 1 0 0 1  Was there an injury with Fall? 0 0 0 0 0  Fall Risk Category Calculator 0 1 0 0 1  Patient at Risk for Falls Due to No Fall Risks History of fall(s);Impaired balance/gait;Impaired mobility History of fall(s);Impaired balance/gait History of fall(s);Impaired balance/gait;Impaired mobility History of fall(s);Impaired balance/gait;Impaired mobility  Patient at Risk for Falls Due to - Comments  right sided hemiparesis     Fall risk Follow up Falls evaluation completed  Falls evaluation completed;Education provided;Falls prevention discussed Falls evaluation completed;Education provided Falls evaluation completed;Education provided;Falls prevention discussed Falls evaluation completed;Education provided;Falls prevention discussed     Data saved with a previous flowsheet row definition   Functional Status Survey:    Vitals:   11/04/23 1201  BP: (!) 164/79  Pulse: 63  Resp: 17  Temp: (!) 96.8 F (36 C)  SpO2: 92%  Weight: 85 lb 6.4 oz (38.7 kg)  Height: 5' 4 (1.626 m)   Body mass index is 14.66 kg/m. Physical Exam Vitals reviewed.  Constitutional:      Appearance: Normal  appearance.  HENT:     Head: Normocephalic.     Nose: Nose normal.     Mouth/Throat:     Mouth: Mucous membranes are moist.     Pharynx: Oropharynx is clear.  Eyes:     Pupils: Pupils are equal, round, and reactive to light.  Cardiovascular:     Rate and Rhythm: Normal rate. Rhythm irregular.     Pulses: Normal pulses.     Heart sounds: Normal heart sounds. No murmur heard. Pulmonary:     Effort: Pulmonary effort is normal.     Breath sounds: Normal breath sounds.  Abdominal:     General: Abdomen is flat. Bowel sounds are normal.     Palpations: Abdomen is soft.  Musculoskeletal:        General: No swelling.     Cervical back: Neck supple.  Skin:    General: Skin is warm.  Neurological:     Mental Status: She is alert and oriented to person, place, and time.  Psychiatric:        Mood and Affect: Mood normal.        Thought Content: Thought content normal.     Labs reviewed: Recent Labs    03/18/23 0000 05/21/23 0000 10/15/23 0000  NA 140 142 141  K 4.4 4.2 4.9  CL 106 107 105  CO2 22 25* 25*  BUN 18 24* 34*  CREATININE 0.8 1.0 0.9  CALCIUM  10.0 8.9 9.2   Recent Labs    03/18/23 0000 05/21/23 0000 06/24/23 0000  AST 27 30 30   ALT 23 35 30  ALKPHOS 96 93 95  ALBUMIN  4.2 3.4* 3.0*   Recent Labs    03/18/23 0000 05/21/23 0000 10/15/23 0000  WBC 4.0 7.2 6.7  HGB 12.5 13.6 13.3  HCT 38 41 41  PLT 180 246 221   Lab Results  Component Value Date   TSH 0.61 08/08/2022   Lab Results  Component Value Date   HGBA1C 6.0 (H) 11/13/2020   Lab Results  Component Value Date  CHOL 164 03/18/2023   HDL 61 03/18/2023   LDLCALC 67 03/18/2023   TRIG 179 (A) 03/18/2023   CHOLHDL 3.0 11/13/2020    Significant Diagnostic Results in last 30 days:  No results found.  Assessment/Plan 1. Acute midline low back pain without sciatica (Primary) Xray shows Multiple Compression fracture But they seem chronic But her pain seems related and in that Area Will  continue pain control for now Start her on tramadol  50 mg Q6 and Robaxin  250 mg Q6 She is going to see if she can work with her therapy   2. Age-related osteoporosis with current pathological fracture, sequela DEXA in 7/24 showed Left radius of -3.3 Nothing else was measured Discussed with Husband and her and they are open to Prolia Will Follow   Family/ staff Communication:   Labs/tests ordered:

## 2023-11-05 DIAGNOSIS — I69351 Hemiplegia and hemiparesis following cerebral infarction affecting right dominant side: Secondary | ICD-10-CM | POA: Diagnosis not present

## 2023-11-05 DIAGNOSIS — I6602 Occlusion and stenosis of left middle cerebral artery: Secondary | ICD-10-CM | POA: Diagnosis not present

## 2023-11-05 DIAGNOSIS — M62421 Contracture of muscle, right upper arm: Secondary | ICD-10-CM | POA: Diagnosis not present

## 2023-11-10 DIAGNOSIS — M62421 Contracture of muscle, right upper arm: Secondary | ICD-10-CM | POA: Diagnosis not present

## 2023-11-10 DIAGNOSIS — I69351 Hemiplegia and hemiparesis following cerebral infarction affecting right dominant side: Secondary | ICD-10-CM | POA: Diagnosis not present

## 2023-11-10 DIAGNOSIS — I6602 Occlusion and stenosis of left middle cerebral artery: Secondary | ICD-10-CM | POA: Diagnosis not present

## 2023-11-10 MED ORDER — TRAMADOL HCL 50 MG PO TABS: 50.0000 mg | ORAL_TABLET | Freq: Four times a day (QID) | ORAL | Status: AC | PRN

## 2023-11-11 DIAGNOSIS — I6602 Occlusion and stenosis of left middle cerebral artery: Secondary | ICD-10-CM | POA: Diagnosis not present

## 2023-11-11 DIAGNOSIS — M62421 Contracture of muscle, right upper arm: Secondary | ICD-10-CM | POA: Diagnosis not present

## 2023-11-11 DIAGNOSIS — I69351 Hemiplegia and hemiparesis following cerebral infarction affecting right dominant side: Secondary | ICD-10-CM | POA: Diagnosis not present

## 2023-11-13 DIAGNOSIS — M62421 Contracture of muscle, right upper arm: Secondary | ICD-10-CM | POA: Diagnosis not present

## 2023-11-13 DIAGNOSIS — I69351 Hemiplegia and hemiparesis following cerebral infarction affecting right dominant side: Secondary | ICD-10-CM | POA: Diagnosis not present

## 2023-11-13 DIAGNOSIS — I6602 Occlusion and stenosis of left middle cerebral artery: Secondary | ICD-10-CM | POA: Diagnosis not present

## 2023-11-18 DIAGNOSIS — I69351 Hemiplegia and hemiparesis following cerebral infarction affecting right dominant side: Secondary | ICD-10-CM | POA: Diagnosis not present

## 2023-11-18 DIAGNOSIS — M62421 Contracture of muscle, right upper arm: Secondary | ICD-10-CM | POA: Diagnosis not present

## 2023-11-18 DIAGNOSIS — I6602 Occlusion and stenosis of left middle cerebral artery: Secondary | ICD-10-CM | POA: Diagnosis not present

## 2023-11-19 DIAGNOSIS — I69351 Hemiplegia and hemiparesis following cerebral infarction affecting right dominant side: Secondary | ICD-10-CM | POA: Diagnosis not present

## 2023-11-19 DIAGNOSIS — I6602 Occlusion and stenosis of left middle cerebral artery: Secondary | ICD-10-CM | POA: Diagnosis not present

## 2023-11-19 DIAGNOSIS — M62421 Contracture of muscle, right upper arm: Secondary | ICD-10-CM | POA: Diagnosis not present

## 2023-12-02 ENCOUNTER — Encounter: Payer: Self-pay | Admitting: Internal Medicine

## 2023-12-02 ENCOUNTER — Non-Acute Institutional Stay (SKILLED_NURSING_FACILITY): Admitting: Internal Medicine

## 2023-12-02 DIAGNOSIS — G8191 Hemiplegia, unspecified affecting right dominant side: Secondary | ICD-10-CM | POA: Diagnosis not present

## 2023-12-02 DIAGNOSIS — R310 Gross hematuria: Secondary | ICD-10-CM

## 2023-12-02 DIAGNOSIS — K5901 Slow transit constipation: Secondary | ICD-10-CM | POA: Diagnosis not present

## 2023-12-02 DIAGNOSIS — F331 Major depressive disorder, recurrent, moderate: Secondary | ICD-10-CM

## 2023-12-02 DIAGNOSIS — I480 Paroxysmal atrial fibrillation: Secondary | ICD-10-CM | POA: Diagnosis not present

## 2023-12-02 DIAGNOSIS — M8000XS Age-related osteoporosis with current pathological fracture, unspecified site, sequela: Secondary | ICD-10-CM

## 2023-12-02 DIAGNOSIS — E782 Mixed hyperlipidemia: Secondary | ICD-10-CM | POA: Diagnosis not present

## 2023-12-02 DIAGNOSIS — K219 Gastro-esophageal reflux disease without esophagitis: Secondary | ICD-10-CM

## 2023-12-02 DIAGNOSIS — I10 Essential (primary) hypertension: Secondary | ICD-10-CM

## 2023-12-02 NOTE — Progress Notes (Unsigned)
 Location:  [facility] Room Number: 116-P Place of Service:  SNF [phone]) Provider:  [name], MD  Patient Care Team: [name], MD as PCP - General (Internal Medicine) Jeffrie Oneil BROCKS, MD as PCP - Cardiology (Cardiology) [name], MD as PCP - Electrophysiology (Cardiology) Alix Charleston, MD as Consulting Physician (Neurosurgery)  Extended Emergency Contact Information Primary Emergency Contact: Noell,[name] Address: [address]          [place] [phone] [place] Home Phone: [phone] Mobile Phone: [phone] Relation: Spouse Secondary Emergency Contact: Hamza,[name] Address: [address]          [place], [place] [address] [place] Mobile Phone: [phone] Relation: Son  Code Status:  Full Code Goals of care: Advanced Directive information    06/17/2023   10:31 AM  Advanced Directives  Does Patient Have a Medical Advance Directive? Yes  Type of Estate agent of [place];Living will  Does patient want to make changes to medical advance directive? No - Patient declined  Copy of Healthcare Power of Attorney in Chart? Yes - validated most recent copy scanned in chart (See row information)     Chief Complaint  Patient presents with   Medical Management of Chronic Issues    HPI:  Pt is a 78 y.o. female seen today for medical management of chronic diseases.    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  COPD on 2 l of Oxygen   Acute issues Back Pain Xray shows Multiple Compression fracture But they seem chronic  Pain is now mostly resolved and she is working back with the therapy  Right Hemiplegia Works with PT and OT Has Aphasia but does well Walks with Assist and her brace   Plan to get Repeat CT in few months to follow RLL Resolving  Pneumonia and Nodular Scar  Hematuria Resolved with Antibiotics Do not see repeat UA  Past Medical History:  Diagnosis Date   Anxiety    Arthritis    some in my lower back; probably elbows, knees (11/18/2017)   Atrial fibrillation (HCC)    Bell's palsy    when pt. was 78 yrs old, when under stress the left side of face will droop.   Complication of anesthesia    vascular OR 2016; BP bottomed out; couldn't get it regulated; ended up in ICU for DAYS (11/18/2017)   GERD (gastroesophageal reflux disease)    History of kidney stones    Hypertension    Hypertrophic cardiomyopathy (HCC)    severe LV basilar hypertrophy witn no evidence of significant outflow tract obstruction, EF 65-70%, mild LAE, mild TR, grade 1a diastolic dysfunction 05/15/10 (Dr. Oneil Jeffrie) (Atrial Septal Hypertrophy pattern)-- Intra-op TEE with dsignificant outflow tract obstruction - AI, MR & TR   Insomnia    Mild aortic sclerosis    Osteopenia    Peripheral vascular disease (HCC)    Syncope    , Vagal   Past Surgical History:  Procedure Laterality Date   AUGMENTATION MAMMAPLASTY Bilateral    BACK SURGERY     CARDIAC CATHETERIZATION N/A 05/07/2016   Procedure: Left Heart Cath and Coronary Angiography;  Surgeon: Lonni JONETTA Cash, MD;  Location: [facility] INVASIVE CV LAB;  Service: Cardiovascular;  Laterality: N/A;   CARDIOVERSION N/A 09/24/2017   Procedure: CARDIOVERSION;  Surgeon: Rolan Ezra RAMAN, MD;  Location: [facility] ENDOSCOPY;  Service: Cardiovascular;  Laterality: N/A;   DILATION AND CURETTAGE OF UTERUS     ENDARTERECTOMY FEMORAL Right 03/02/2015   Procedure: ENDARTERECTOMY RIGHT FEMORAL;  Surgeon: Lonni RAMAN Blade, MD;  Location: [facility] OR;  Service: Vascular;  Laterality: Right;   ESOPHAGOGASTRODUODENOSCOPY (EGD) WITH PROPOFOL  N/A 12/01/2020   Procedure: ESOPHAGOGASTRODUODENOSCOPY (EGD) WITH PROPOFOL ;  Surgeon: Paola Dreama SAILOR, MD;  Location: [facility] ENDOSCOPY;  Service: General;  Laterality: N/A;   FACIAL  COSMETIC SURGERY Left 2002   related to Bell's Palsy @ age 79; left eye/side of face droopy; tried to make area symmetrical   FEMORAL-POPLITEAL BYPASS GRAFT Right 03/02/2015   Procedure: BYPASS GRAFT FEMORAL-BELOW KNEE POPLITEAL ARTERY;  Surgeon: Lonni RAMAN Blade, MD;  Location: [facility] OR;  Service: Vascular;  Laterality: Right;   INGUINAL HERNIA REPAIR Bilateral 2002   IR ANGIO INTRA EXTRACRAN SEL COM CAROTID INNOMINATE UNI L MOD SED  11/16/2020   IR CT HEAD LTD  11/13/2020   IR PERCUTANEOUS ART THROMBECTOMY/INFUSION INTRACRANIAL INC DIAG ANGIO  11/13/2020   OVARIAN CYST REMOVAL Left    PEG PLACEMENT N/A 12/01/2020   Procedure: PERCUTANEOUS ENDOSCOPIC GASTROSTOMY (PEG) PLACEMENT;  Surgeon: Paola Dreama SAILOR, MD;  Location: [facility] ENDOSCOPY;  Service: General;  Laterality: N/A;   PERIPHERAL VASCULAR CATHETERIZATION N/A 01/16/2015   Procedure: Abdominal Aortogram;  Surgeon: Lonni RAMAN Blade, MD;  Location: [facility] INVASIVE CV LAB;  Service: Cardiovascular;  Laterality: N/A;   POSTERIOR LUMBAR FUSION  2015   have plates and screws in there   RADIOLOGY WITH ANESTHESIA N/A 11/13/2020   Procedure: IR WITH ANESTHESIA;  Surgeon: Radiologist, Medication, MD;  Location: [facility] OR;  Service: Radiology;  Laterality: N/A;   TONSILLECTOMY      Allergies  Allergen Reactions   Amoxicillin Other (See Comments)    UTI Has patient had a PCN reaction causing immediate rash, facial/tongue/throat swelling, SOB or lightheadedness with hypotension: No Has patient had a PCN reaction causing severe rash involving mucus membranes or skin necrosis: No Has patient had a PCN reaction that required hospitalization: No Has patient had a PCN reaction occurring within the last 10 years: Yes--UTI ONLY If all of the above answers are NO, then may proceed with Cephalosporin use.    Atenolol Cough   Crestor  [Rosuvastatin  Calcium ] Other (See Comments)    Muscle aches   Pravastatin  Other (See Comments)    Muscle aches    Rosuvastatin  Calcium     Sulfa Antibiotics Nausea Only   Codeine Other (See Comments)    hallucinations    Outpatient Encounter Medications as of 12/02/2023  Medication Sig   amiodarone  (PACERONE ) 100 MG tablet Take 100 mg by mouth daily.   amLODipine  (NORVASC ) 2.5 MG tablet Take 2.5 mg by mouth daily.   apixaban  (ELIQUIS ) 5 MG TABS tablet Take 5 mg by mouth 2 (two) times daily. May give crushed   buPROPion  (WELLBUTRIN  XL) 150 MG 24 hr tablet Take 150 mg by mouth daily.   diclofenac Sodium (VOLTAREN) 1 % GEL Apply one application to right hip and knee four times daily as needed.   Eyelid Cleansers (OCUSOFT EYELID CLEANSING) PADS Apply 1 Application topically daily.   ezetimibe  (ZETIA ) 10 MG tablet Take 10 mg by mouth daily. Oral if crushed   lidocaine  (LIDODERM ) 5 % Place 1 patch onto the skin daily. Remove & Discard patch within 12 hours or as directed by MD   OXYGEN  Inhale 3 L into the lungs as directed. to  maintain sats > or equal to 90%. May titrate Three Times A Day; 07:00 AM - 03:00 PM, 03:00 PM - 11:00 PM, 11:00 PM - 7:00 am   pantoprazole  (PROTONIX ) 40 MG tablet Take 40 mg by mouth 2 (two) times daily.   polyethylene glycol powder (GLYCOLAX /MIRALAX ) 17 GM/SCOOP powder Take 17 g by mouth every other day.   sennosides-docusate sodium  (SENOKOT-S) 8.6-50 MG tablet Take 2 tablets by mouth 2 (two) times daily.   Tiotropium Bromide-Olodaterol (STIOLTO RESPIMAT ) 2.5-2.5 MCG/ACT AERS Inhale 2 puffs into the lungs daily.   traMADol  (ULTRAM ) 50 MG tablet Take 50 mg by mouth 2 (two) times daily as needed.   acetaminophen  (TYLENOL ) 325 MG tablet Take 650 mg by mouth every 4 (four) hours as needed.   albuterol  (VENTOLIN  HFA) 108 (90 Base) MCG/ACT inhaler Inhale 2 puffs into the lungs every 4 (four) hours as needed for wheezing or shortness of breath.   amiodarone  (PACERONE ) 200 MG tablet Take 100 mg by mouth daily. May give crushed (Patient not taking: Reported on 12/02/2023)   amLODipine  (NORVASC )  5 MG tablet Take 2.5 mg by mouth daily. (Patient not taking: Reported on 12/02/2023)   Benzonatate  (TESSALON  PERLES PO) Take 200 mg by mouth as needed. 200mg , oral as needed, Tessalon  Perles, 200mg , PO TID PRN cough   bisacodyl  (DULCOLAX) 10 MG suppository Place 10 mg rectally daily as needed for moderate constipation.   bisacodyl  (DULCOLAX) 5 MG EC tablet Take 5 mg by mouth 2 (two) times daily as needed for moderate constipation.   Emollient (CERAVE) CREA Apply 1 Application topically as needed.   furosemide  (LASIX ) 20 MG tablet Take 20 mg by mouth daily as needed. Give Lasix  20mg  for weight gain equal or greater than 3 pounds   hydrALAZINE  (APRESOLINE ) 10 MG tablet Take 10 mg by mouth 3 (three) times daily as needed (SBP > 165 or DBP > 95).   hydrocortisone cream 1 % Apply 1 Application topically 2 (two) times daily as needed for itching.   ipratropium-albuterol  (DUONEB) 0.5-2.5 (3) MG/3ML SOLN Inhale 3 mLs into the lungs every 6 (six) hours as needed.   ketoconazole  (NIZORAL ) 2 % cream Apply 1 application  topically daily as needed for irritation.   methocarbamol  (ROBAXIN ) 500 MG tablet Take 250 mg by mouth every 6 (six) hours as needed.   ondansetron  (ZOFRAN ) 4 MG tablet Take 4 mg by mouth every 6 (six) hours as needed for nausea or vomiting.   Polyethyl Glycol-Propyl Glycol (SYSTANE) 0.4-0.3 % SOLN Apply 2 drops to eye every 6 (six) hours as needed (dry eyes).   zinc oxide 20 % ointment Apply 1 Application topically 2 (two) times daily as needed for irritation.   No facility-administered encounter medications on file as of 12/02/2023.    Review of Systems  Constitutional:  Negative for activity change and appetite change.  HENT: Negative.    Respiratory:  Negative for cough and shortness of breath.   Cardiovascular:  Negative for leg swelling.  Gastrointestinal:  Negative for constipation.  Genitourinary: Negative.   Musculoskeletal:  Positive for gait problem. Negative for arthralgias  and myalgias.  Skin: Negative.   Neurological:  Positive for speech difficulty. Negative for dizziness and weakness.  Psychiatric/Behavioral:  Negative for confusion, dysphoric mood and sleep disturbance.     Immunization History  Administered Date(s) Administered   Fluad Quad(high Dose 65+) 02/05/2022, 01/08/2023   Fluzone Influenza virus vaccine,trivalent (IIV3), split virus 04/30/2010, 01/26/2016, 02/07/2018, 02/01/2020   Influenza Split 01/16/2009  Influenza, High Dose Seasonal PF 02/07/2018, 01/25/2019, 02/28/2021, 02/05/2022   Influenza,inj,Quad PF,6+ Mos 03/06/2012, 01/26/2016, 01/10/2017   Influenza,inj,quad, With Preservative 02/14/2015   Moderna Covid-19 Vaccine Bivalent Booster 102yrs & up 02/28/2021, 02/04/2023   PFIZER(Purple Top)SARS-COV-2 Vaccination 05/06/2019, 05/27/2019, 01/27/2020, 09/18/2020   Pneumococcal Conjugate-13 03/19/2017   Pneumococcal Polysaccharide-23 02/14/2015   Respiratory Syncytial Virus Vaccine,Recomb Aduvanted(Arexvy) 02/15/2022   Td 02/25/2005   Tdap 05/19/2013   Zoster, Live 07/28/2006   Pertinent  Health Maintenance Due  Topic Date Due   INFLUENZA VACCINE  11/14/2023   DEXA SCAN  Discontinued      05/07/2022   11:59 AM 08/13/2022   11:02 AM 11/26/2022   12:34 PM 12/31/2022    3:26 PM 03/11/2023    2:48 PM  Fall Risk  Falls in the past year? 0 1 0 0 1  Was there an injury with Fall? 0 0 0 0 0  Fall Risk Category Calculator 0 1 0 0 1  Patient at Risk for Falls Due to No Fall Risks History of fall(s);Impaired balance/gait;Impaired mobility History of fall(s);Impaired balance/gait History of fall(s);Impaired balance/gait;Impaired mobility History of fall(s);Impaired balance/gait;Impaired mobility  Patient at Risk for Falls Due to - Comments  right sided hemiparesis     Fall risk Follow up Falls evaluation completed  Falls evaluation completed;Education provided;Falls prevention discussed Falls evaluation completed;Education provided Falls  evaluation completed;Education provided;Falls prevention discussed Falls evaluation completed;Education provided;Falls prevention discussed     Data saved with a previous flowsheet row definition   Functional Status Survey:    Vitals:   12/02/23 1406 12/02/23 1408  BP: (!) 156/70 (!) 156/70  Pulse: (!) 59   Temp: (!) 96.8 F (36 C)   SpO2: 95%   Weight: 85 lb 9.6 oz (38.8 kg)   Height: 5' 4 (1.626 m)    Body mass index is 14.69 kg/m. Physical Exam Vitals reviewed.  Constitutional:      Appearance: Normal appearance.  HENT:     Head: Normocephalic.     Nose: Nose normal.     Mouth/Throat:     Mouth: Mucous membranes are moist.     Pharynx: Oropharynx is clear.  Eyes:     Pupils: Pupils are equal, round, and reactive to light.  Cardiovascular:     Rate and Rhythm: Normal rate and regular rhythm.     Pulses: Normal pulses.     Heart sounds: Normal heart sounds. No murmur heard. Pulmonary:     Effort: Pulmonary effort is normal.     Breath sounds: Normal breath sounds.  Abdominal:     General: Abdomen is flat. Bowel sounds are normal.     Palpations: Abdomen is soft.  Musculoskeletal:        General: No swelling.     Cervical back: Neck supple.  Skin:    General: Skin is warm.  Neurological:     General: No focal deficit present.     Mental Status: She is alert and oriented to person, place, and time.  Psychiatric:        Mood and Affect: Mood normal.        Thought Content: Thought content normal.     Labs reviewed: Recent Labs    03/18/23 0000 05/21/23 0000 10/15/23 0000  NA 140 142 141  K 4.4 4.2 4.9  CL 106 107 105  CO2 22 25* 25*  BUN 18 24* 34*  CREATININE 0.8 1.0 0.9  CALCIUM  10.0 8.9 9.2   Recent Labs  03/18/23 0000 05/21/23 0000 06/24/23 0000  AST 27 30 30   ALT 23 35 30  ALKPHOS 96 93 95  ALBUMIN  4.2 3.4* 3.0*   Recent Labs    03/18/23 0000 05/21/23 0000 10/15/23 0000  WBC 4.0 7.2 6.7  HGB 12.5 13.6 13.3  HCT 38 41 41  PLT  180 246 221   Lab Results  Component Value Date   TSH 0.61 08/08/2022   Lab Results  Component Value Date   HGBA1C 6.0 (H) 11/13/2020   Lab Results  Component Value Date   CHOL 164 03/18/2023   HDL 61 03/18/2023   LDLCALC 67 03/18/2023   TRIG 179 (A) 03/18/2023   CHOLHDL 3.0 11/13/2020    Significant Diagnostic Results in last 30 days:  No results found.  Assessment/Plan 1. Age-related osteoporosis with current pathological fracture, sequela (Primary) DEXA in 7/24 showed Left radius of -3.3 Nothing else was measured They both are ready for Prolia Discussed how important it it s to stay on it Order written Labs were done in 7/25 2. Right hemiplegia (HCC) Continues to work with PT Botox  per Dr Onita 3. Gross hematuria Was treated with Antibiotics Do not  See Repeat UA Will order again  4. Slow transit constipation Senna and Miralax   5. Essential hypertension Amlodipine   6. Mixed hyperlipidemia Zetia   7. PAF (paroxysmal atrial fibrillation) (HCC) Amiodarone  and eliquis   8. Moderate episode of recurrent major depressive disorder (HCC) Wellbutrin   9. Gastroesophageal reflux disease without esophagitis Protonix  10 COPD On Oxygen  11 Back Pain Almost resolved Robaxin  and Tramadol  PRn  Family/ staff Communication:   Labs/tests ordered:  UA

## 2023-12-16 ENCOUNTER — Encounter: Payer: Self-pay | Admitting: Orthopedic Surgery

## 2023-12-16 ENCOUNTER — Non-Acute Institutional Stay (SKILLED_NURSING_FACILITY): Admitting: Orthopedic Surgery

## 2023-12-16 DIAGNOSIS — L03119 Cellulitis of unspecified part of limb: Secondary | ICD-10-CM

## 2023-12-16 DIAGNOSIS — G8191 Hemiplegia, unspecified affecting right dominant side: Secondary | ICD-10-CM | POA: Diagnosis not present

## 2023-12-16 NOTE — Progress Notes (Signed)
 [facility] Room Number: 116-P Place of Service:  SNF [phone]) Provider:  Greig Cluster, NP  PCP: [name], MD  Patient Care Team: [name], MD as PCP - General (Internal Medicine) Jeffrie Oneil BROCKS, MD as PCP - Cardiology (Cardiology) [name], MD as PCP - Electrophysiology (Cardiology) Alix Charleston, MD as Consulting Physician (Neurosurgery)  Extended Emergency Contact Information Primary Emergency Contact: Rauth,[name] Address: [address]          [place] [phone] [place] Home Phone: [phone] Mobile Phone: [phone] Relation: Spouse Secondary Emergency Contact: Villanueva,[name] Address: [address]          [place], [place] [address] [place] Mobile Phone: [phone] Relation: Son  Code Status:  FULL CODE Goals of care: Advanced Directive information    12/16/2023   12:22 PM  Advanced Directives  Does Patient Have a Medical Advance Directive? Yes  Type of Advance Directive Living will  Does patient want to make changes to medical advance directive? No - Patient declined     Chief Complaint  Patient presents with   Nail Problem    Right 5th great toe purple.    HPI:  Pt is a 78 y.o. female seen today for acute visit due to right foot discoloration and swelling.   She currently resides on the skilled nursing unit at [facility]. 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   Husband present during encounter.   Right foot with purple/red discoloration near 5th/4th/3rd toes x 1 day. Right foot also with increased swelling and tenderness. No recent injury of fall. 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 Afebrile. Vitals stable.   Right hemiplegia- 08/22 acute left MCA stroke with right sided weakness, ambulates with PWC, Botox   injections every 3 months by Dr. Leonie last done 06/18    Past Medical History:  Diagnosis Date   Anxiety    Arthritis    some in my lower back; probably elbows, knees (11/18/2017)   Atrial fibrillation (HCC)    Bell's palsy    when pt. was 78 yrs old, when under stress the left side of face will droop.   Complication of anesthesia    vascular OR 2016; BP bottomed out; couldn't get it regulated; ended up in ICU for DAYS (11/18/2017)   GERD (gastroesophageal reflux disease)    History of kidney stones    Hypertension    Hypertrophic cardiomyopathy (HCC)    severe LV basilar hypertrophy witn no evidence of significant outflow tract obstruction, EF 65-70%, mild LAE, mild TR, grade 1a diastolic dysfunction 05/15/10 (Dr. Oneil Jeffrie) (Atrial Septal Hypertrophy pattern)-- Intra-op TEE with dsignificant outflow tract obstruction - AI, MR & TR   Insomnia    Mild aortic sclerosis    Osteopenia    Peripheral vascular disease (HCC)    Syncope    , Vagal   Past Surgical History:  Procedure Laterality Date   AUGMENTATION MAMMAPLASTY Bilateral    BACK SURGERY     CARDIAC CATHETERIZATION N/A 05/07/2016   Procedure: Left Heart Cath and Coronary Angiography;  Surgeon: Lonni JONETTA Cash, MD;  Location: [facility] INVASIVE CV LAB;  Service: Cardiovascular;  Laterality: N/A;   CARDIOVERSION N/A 09/24/2017   Procedure: CARDIOVERSION;  Surgeon: Rolan Ezra RAMAN, MD;  Location: [facility] ENDOSCOPY;  Service: Cardiovascular;  Laterality: N/A;  DILATION AND CURETTAGE OF UTERUS     ENDARTERECTOMY FEMORAL Right 03/02/2015   Procedure: ENDARTERECTOMY RIGHT FEMORAL;  Surgeon: Lonni GORMAN Blade, MD;  Location: [facility] OR;  Service: Vascular;  Laterality: Right;   ESOPHAGOGASTRODUODENOSCOPY (EGD) WITH PROPOFOL  N/A 12/01/2020   Procedure: ESOPHAGOGASTRODUODENOSCOPY (EGD) WITH PROPOFOL ;  Surgeon: Paola Dreama SAILOR, MD;  Location: [facility] ENDOSCOPY;  Service: General;  Laterality: N/A;   FACIAL COSMETIC SURGERY Left 2002   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 10; left eye/side of face droopy; tried to make area symmetrical   FEMORAL-POPLITEAL BYPASS GRAFT Right 03/02/2015   Procedure: BYPASS GRAFT FEMORAL-BELOW KNEE POPLITEAL ARTERY;  Surgeon: Lonni GORMAN Blade, MD;  Location: [facility] OR;  Service: Vascular;  Laterality: Right;   INGUINAL HERNIA REPAIR Bilateral 2002   IR ANGIO INTRA EXTRACRAN SEL COM CAROTID INNOMINATE UNI L MOD SED  11/16/2020   IR CT HEAD LTD  11/13/2020   IR PERCUTANEOUS ART THROMBECTOMY/INFUSION INTRACRANIAL INC DIAG ANGIO  11/13/2020   OVARIAN CYST REMOVAL Left    PEG PLACEMENT N/A 12/01/2020   Procedure: PERCUTANEOUS ENDOSCOPIC GASTROSTOMY (PEG) PLACEMENT;  Surgeon: Paola Dreama SAILOR, MD;  Location: [facility] ENDOSCOPY;  Service: General;  Laterality: N/A;   PERIPHERAL VASCULAR CATHETERIZATION N/A 01/16/2015   Procedure: Abdominal Aortogram;  Surgeon: Lonni GORMAN Blade, MD;  Location: [facility] INVASIVE CV LAB;  Service: Cardiovascular;  Laterality: N/A;   POSTERIOR LUMBAR FUSION  2015   have plates and screws in there   RADIOLOGY WITH ANESTHESIA N/A 11/13/2020   Procedure: IR WITH ANESTHESIA;  Surgeon: Radiologist, Medication, MD;  Location: [facility] OR;  Service: Radiology;  Laterality: N/A;   TONSILLECTOMY      Allergies  Allergen Reactions   Amoxicillin Other (See Comments)    UTI Has patient had a PCN reaction causing immediate rash, facial/tongue/throat swelling, SOB or lightheadedness with hypotension: No Has patient had a PCN reaction causing severe rash involving mucus membranes or skin necrosis: No Has patient had a PCN reaction that required hospitalization: No Has patient had a PCN reaction occurring within the last 10 years: Yes--UTI ONLY If all of the above answers are NO, then may proceed with Cephalosporin use.    Atenolol Cough   Crestor  [Rosuvastatin  Calcium ] Other (See Comments)    Muscle aches   Pravastatin  Other (See Comments)    Muscle aches   Rosuvastatin  Calcium     Sulfa Antibiotics Nausea  Only   Codeine Other (See Comments)    hallucinations    Allergies as of 12/16/2023       Reactions   Amoxicillin Other (See Comments)   UTI Has patient had a PCN reaction causing immediate rash, facial/tongue/throat swelling, SOB or lightheadedness with hypotension: No Has patient had a PCN reaction causing severe rash involving mucus membranes or skin necrosis: No Has patient had a PCN reaction that required hospitalization: No Has patient had a PCN reaction occurring within the last 10 years: Yes--UTI ONLY If all of the above answers are NO, then may proceed with Cephalosporin use.   Atenolol Cough   Crestor  [rosuvastatin  Calcium ] Other (See Comments)   Muscle aches   Pravastatin  Other (See Comments)   Muscle aches   Rosuvastatin  Calcium     Sulfa Antibiotics Nausea Only   Codeine Other (See Comments)   hallucinations        Medication List        Accurate as of December 16, 2023 12:22 PM. If you have any questions, ask your nurse or doctor.  acetaminophen  325 MG tablet Commonly known as: TYLENOL  Take 650 mg by mouth every 4 (four) hours as needed.   albuterol  108 (90 Base) MCG/ACT inhaler Commonly known as: VENTOLIN  HFA Inhale 2 puffs into the lungs every 4 (four) hours as needed for wheezing or shortness of breath.   amiodarone  100 MG tablet Commonly known as: PACERONE  Take 100 mg by mouth daily.   amLODipine  2.5 MG tablet Commonly known as: NORVASC  Take 2.5 mg by mouth daily.   apixaban  5 MG Tabs tablet Commonly known as: ELIQUIS  Take 5 mg by mouth 2 (two) times daily. May give crushed   bisacodyl  5 MG EC tablet Commonly known as: DULCOLAX Take 5 mg by mouth 2 (two) times daily as needed for moderate constipation.   bisacodyl  10 MG suppository Commonly known as: DULCOLAX Place 10 mg rectally daily as needed for moderate constipation.   buPROPion  150 MG 24 hr tablet Commonly known as: WELLBUTRIN  XL Take 150 mg by mouth daily.   Calcium   600 600 MG Tabs tablet Generic drug: calcium  carbonate Take 600 mg by mouth daily.   CeraVe Crea Apply 1 Application topically as needed.   denosumab 60 MG/ML Sosy injection Commonly known as: PROLIA Inject 60 mg into the skin every 6 (six) months.   DOXYCYCLINE  HYCLATE PO Take 100 mg by mouth in the morning and at bedtime.   ezetimibe  10 MG tablet Commonly known as: ZETIA  Take 10 mg by mouth daily. Oral if crushed   furosemide  20 MG tablet Commonly known as: LASIX  Take 20 mg by mouth daily as needed. Give Lasix  20mg  for weight gain equal or greater than 3 pounds   hydrALAZINE  10 MG tablet Commonly known as: APRESOLINE  Take 10 mg by mouth 3 (three) times daily as needed (SBP > 165 or DBP > 95).   hydrocortisone cream 1 % Apply 1 Application topically 2 (two) times daily as needed for itching.   ipratropium-albuterol  0.5-2.5 (3) MG/3ML Soln Commonly known as: DUONEB Inhale 3 mLs into the lungs every 6 (six) hours as needed.   ketoconazole  2 % cream Commonly known as: NIZORAL  Apply 1 application  topically every 12 (twelve) hours as needed for irritation.   lidocaine  5 % Commonly known as: LIDODERM  Place 1 patch onto the skin daily. Remove & Discard patch within 12 hours or as directed by MD   methocarbamol  500 MG tablet Commonly known as: ROBAXIN  Take 250 mg by mouth every 6 (six) hours as needed.   OcuSoft Eyelid Cleansing Pads Apply 1 Application topically daily.   ondansetron  4 MG tablet Commonly known as: ZOFRAN  Take 4 mg by mouth every 6 (six) hours as needed for nausea or vomiting.   OXYGEN  Inhale 3 L into the lungs as directed. to maintain sats > or equal to 90%. May titrate Three Times A Day; 07:00 AM - 03:00 PM, 03:00 PM - 11:00 PM, 11:00 PM - 7:00 am   pantoprazole  40 MG tablet Commonly known as: PROTONIX  Take 40 mg by mouth 2 (two) times daily.   polyethylene glycol powder 17 GM/SCOOP powder Commonly known as: GLYCOLAX /MIRALAX  Take 17 g by mouth  every other day.   sennosides-docusate sodium  8.6-50 MG tablet Commonly known as: SENOKOT-S Take 2 tablets by mouth 2 (two) times daily.   Stiolto Respimat  2.5-2.5 MCG/ACT Aers Generic drug: Tiotropium Bromide-Olodaterol Inhale 2 puffs into the lungs daily.   Systane 0.4-0.3 % Soln Generic drug: Polyethyl Glycol-Propyl Glycol Apply 2 drops to eye every 6 (six) hours as  needed (dry eyes).   TESSALON  PERLES PO Take 200 mg by mouth as needed. 200mg , oral as needed, Tessalon  Perles, 200mg , PO TID PRN cough   traMADol  50 MG tablet Commonly known as: ULTRAM  Take 50 mg by mouth 2 (two) times daily as needed.   Vitamin D3 20 MCG (800 UNIT) Tabs Take 1 tablet by mouth daily.   Voltaren 1 % Gel Generic drug: diclofenac Sodium Apply 4 g topically every 6 (six) hours as needed. Apply one application to right hip and knee topically.   zinc oxide 20 % ointment Apply 1 Application topically 2 (two) times daily as needed for irritation.        Review of Systems  Constitutional:  Negative for fatigue and fever.  Respiratory:  Negative for cough and shortness of breath.   Cardiovascular:  Positive for leg swelling. Negative for chest pain.  Skin:  Positive for color change.  Neurological:  Positive for speech difficulty and weakness.  Psychiatric/Behavioral:  Negative for confusion. The patient is not nervous/anxious.     Immunization History  Administered Date(s) Administered   Fluad Quad(high Dose 65+) 02/05/2022, 01/08/2023   Fluzone Influenza virus vaccine,trivalent (IIV3), split virus 04/30/2010, 01/26/2016, 02/07/2018, 02/01/2020   INFLUENZA, HIGH DOSE SEASONAL PF 02/07/2018, 01/25/2019, 02/28/2021, 02/05/2022   Influenza Split 01/16/2009   Influenza,inj,Quad PF,6+ Mos 03/06/2012, 01/26/2016, 01/10/2017   Influenza,inj,quad, With Preservative 02/14/2015   Moderna Covid-19 Vaccine Bivalent Booster 31yrs & up 02/28/2021, 02/04/2023   PFIZER(Purple Top)SARS-COV-2 Vaccination  05/06/2019, 05/27/2019, 01/27/2020, 09/18/2020   Pneumococcal Conjugate-13 03/19/2017   Pneumococcal Polysaccharide-23 02/14/2015   Respiratory Syncytial Virus Vaccine,Recomb Aduvanted(Arexvy) 02/15/2022   Td 02/25/2005   Tdap 05/19/2013   Zoster, Live 07/28/2006   Pertinent  Health Maintenance Due  Topic Date Due   INFLUENZA VACCINE  11/14/2023   DEXA SCAN  Discontinued      05/07/2022   11:59 AM 08/13/2022   11:02 AM 11/26/2022   12:34 PM 12/31/2022    3:26 PM 03/11/2023    2:48 PM  Fall Risk  Falls in the past year? 0 1 0 0 1  Was there an injury with Fall? 0 0 0 0 0  Fall Risk Category Calculator 0 1 0 0 1  Patient at Risk for Falls Due to No Fall Risks History of fall(s);Impaired balance/gait;Impaired mobility History of fall(s);Impaired balance/gait History of fall(s);Impaired balance/gait;Impaired mobility History of fall(s);Impaired balance/gait;Impaired mobility  Patient at Risk for Falls Due to - Comments  right sided hemiparesis     Fall risk Follow up Falls evaluation completed  Falls evaluation completed;Education provided;Falls prevention discussed Falls evaluation completed;Education provided Falls evaluation completed;Education provided;Falls prevention discussed Falls evaluation completed;Education provided;Falls prevention discussed     Data saved with a previous flowsheet row definition   Functional Status Survey:    Vitals:   12/16/23 1202  BP: 124/77  Pulse: (!) 59  Resp: 16  Temp: (!) 97.3 F (36.3 C)  SpO2: 97%  Weight: 89 lb 12.8 oz (40.7 kg)  Height: 5' 4 (1.626 m)   Body mass index is 15.41 kg/m. Physical Exam Vitals reviewed.  Constitutional:      General: She is not in acute distress. HENT:     Head: Normocephalic.  Eyes:     General:        Right eye: No discharge.        Left eye: No discharge.  Cardiovascular:     Rate and Rhythm: Normal rate and regular rhythm.     Pulses:  Normal pulses.     Heart sounds: Normal heart sounds.   Pulmonary:     Effort: Pulmonary effort is normal. No respiratory distress.     Breath sounds: Normal breath sounds. No wheezing or rales.  Abdominal:     Palpations: Abdomen is soft.  Musculoskeletal:     Cervical back: Neck supple.     Right lower leg: Edema present.     Left lower leg: No edema.     Comments: Right foot 1-2+ swelling and warmth, skin with purple/red discoloration near 5th/4th/3rd digits, mild tenderness with touch, no apparent skin breakdown or injury.   Skin:    General: Skin is warm.     Capillary Refill: Capillary refill takes less than 2 seconds.  Neurological:     General: No focal deficit present.     Mental Status: She is alert. Mental status is at baseline.     Gait: Gait abnormal.     Comments: Right sided weakness, aphasia  Psychiatric:        Mood and Affect: Mood normal.     Labs reviewed: Recent Labs    03/18/23 0000 05/21/23 0000 10/15/23 0000  NA 140 142 141  K 4.4 4.2 4.9  CL 106 107 105  CO2 22 25* 25*  BUN 18 24* 34*  CREATININE 0.8 1.0 0.9  CALCIUM  10.0 8.9 9.2   Recent Labs    03/18/23 0000 05/21/23 0000 06/24/23 0000  AST 27 30 30   ALT 23 35 30  ALKPHOS 96 93 95  ALBUMIN  4.2 3.4* 3.0*   Recent Labs    03/18/23 0000 05/21/23 0000 10/15/23 0000  WBC 4.0 7.2 6.7  HGB 12.5 13.6 13.3  HCT 38 41 41  PLT 180 246 221   Lab Results  Component Value Date   TSH 0.61 08/08/2022   Lab Results  Component Value Date   HGBA1C 6.0 (H) 11/13/2020   Lab Results  Component Value Date   CHOL 164 03/18/2023   HDL 61 03/18/2023   LDLCALC 67 03/18/2023   TRIG 179 (A) 03/18/2023   CHOLHDL 3.0 11/13/2020    Significant Diagnostic Results in last 30 days:  No results found.  Assessment/Plan 1. Cellulitis of foot (Primary) - noted x 1 day - 5th/4th/3rd digit with discoloration, swelling, warmth, mild tenderness - family/patient declined cbc/diff to check WBC - no recent injury or fall - on Eliquis  due to past CVA/  atrial fib - start doxycycline  x 7 days for suspected cellulitis  2. Right hemiplegia (HCC) - followed by neurology - 06/18 botox  injections - cont skilled nursing    Family/ staff Communication: plan discussed with patient and nurse  Labs/tests ordered:  none

## 2023-12-22 DIAGNOSIS — H2513 Age-related nuclear cataract, bilateral: Secondary | ICD-10-CM | POA: Diagnosis not present

## 2024-01-01 DIAGNOSIS — N390 Urinary tract infection, site not specified: Secondary | ICD-10-CM | POA: Diagnosis not present

## 2024-01-05 ENCOUNTER — Non-Acute Institutional Stay (SKILLED_NURSING_FACILITY): Admitting: Adult Health

## 2024-01-05 ENCOUNTER — Encounter: Payer: Self-pay | Admitting: Adult Health

## 2024-01-05 DIAGNOSIS — I480 Paroxysmal atrial fibrillation: Secondary | ICD-10-CM | POA: Diagnosis not present

## 2024-01-05 DIAGNOSIS — E782 Mixed hyperlipidemia: Secondary | ICD-10-CM | POA: Diagnosis not present

## 2024-01-05 DIAGNOSIS — J9611 Chronic respiratory failure with hypoxia: Secondary | ICD-10-CM

## 2024-01-05 DIAGNOSIS — I5032 Chronic diastolic (congestive) heart failure: Secondary | ICD-10-CM | POA: Diagnosis not present

## 2024-01-05 DIAGNOSIS — J439 Emphysema, unspecified: Secondary | ICD-10-CM

## 2024-01-05 DIAGNOSIS — I10 Essential (primary) hypertension: Secondary | ICD-10-CM

## 2024-01-05 DIAGNOSIS — R3121 Asymptomatic microscopic hematuria: Secondary | ICD-10-CM

## 2024-01-05 DIAGNOSIS — M8000XS Age-related osteoporosis with current pathological fracture, unspecified site, sequela: Secondary | ICD-10-CM | POA: Diagnosis not present

## 2024-01-05 DIAGNOSIS — G8191 Hemiplegia, unspecified affecting right dominant side: Secondary | ICD-10-CM | POA: Diagnosis not present

## 2024-01-05 NOTE — Progress Notes (Signed)
 Location:  [facility] Room Number: 116 P Place of Service:  SNF [phone]) Provider:  Tawni America, NP    Patient Care Team: [name], MD as PCP - General (Internal Medicine) Jeffrie Oneil BROCKS, MD as PCP - Cardiology (Cardiology) [name], MD as PCP - Electrophysiology (Cardiology) Alix Charleston, MD as Consulting Physician (Neurosurgery)  Extended Emergency Contact Information Primary Emergency Contact: Walthour,[name] Address: [address]          [place] [phone] [place] Home Phone: [phone] Mobile Phone: [phone] Relation: Spouse Secondary Emergency Contact: Vitolo,[name] Address: [address]          [place], [place] [address] [place] Mobile Phone: [phone] Relation: Son  Code Status:  Full code Goals of care: Advanced Directive information    12/16/2023   12:22 PM  Advanced Directives  Does Patient Have a Medical Advance Directive? Yes  Type of Advance Directive Living will  Does patient want to make changes to medical advance directive? No - Patient declined     Chief Complaint  Patient presents with   Acute Visit    hematuria   Medical Management of Chronic Issues    HPI:  Pt is a 78 y.o. female seen today for an acute visit for hematuria and medical management of chronic diseases.   PMH: COPD, oxygen  dependency, afib, CHF, PAD, CVA with right sided weakness 2022, HTN, OP  A UA C and S was obtained on 12/30/23 to follow up regarding hematuria.  2+ blood 21-50 RBC 50-100 WBC and grew >100K of Proteus She was placed on Cipro  for 5 days and is experiencing some nausea and vomiting. She used zofran  with relief. Has a pencillin allergy. She reports there were no symptoms of a UTI. No visible blood in the urine.   She was treated for a prior UTI in June of 2025 and also was noted to have 3+ blood. She is on eliquis .   COPD: on 2 liters of oxygen . No sob  or cough reported.   OP: DEXA in 7/24 showed Left radius of -3.3 Nothing else was measured  Has multiple thoracic compression fractures Started Prolia August 2025  PAF: bradycardia intermittent, on amoidarone. Eliquis  for CVA risk reduction  Low weight BMI 15.41  BP was running high but has improved   ABnormal chest CT 09/27/23 chronic infiltrate and atx to RLL, chronic asp suspected. Followed by pulmonary   HLD on zetia , statin intolerant. LDL 67 03/18/23  CHF: some edema in feet, no sob or pnd. Has lasix  prn.  2 D echo 60-65% 06/2021 Wt Readings from Last 3 Encounters:  01/05/24 89 lb 12.8 oz (40.7 kg)  12/16/23 89 lb 12.8 oz (40.7 kg)  12/02/23 85 lb 9.6 oz (38.8 kg)    Hx of PVD hx of femoral endarterectomy fem pop 2016 Past Medical History:  Diagnosis Date   Anxiety    Arthritis    some in my lower back; probably elbows, knees (11/18/2017)   Atrial fibrillation (HCC)    Bell's palsy    when pt. was 78 yrs old, when under stress the left side of face will droop.   Complication of anesthesia    vascular OR 2016; BP bottomed out; couldn't get it regulated; ended up in ICU for DAYS (11/18/2017)   GERD (gastroesophageal reflux disease)    History of kidney stones    Hypertension    Hypertrophic cardiomyopathy (HCC)  severe LV basilar hypertrophy witn no evidence of significant outflow tract obstruction, EF 65-70%, mild LAE, mild TR, grade 1a diastolic dysfunction 05/15/10 (Dr. Oneil Parchment) (Atrial Septal Hypertrophy pattern)-- Intra-op TEE with dsignificant outflow tract obstruction - AI, MR & TR   Insomnia    Mild aortic sclerosis    Osteopenia    Peripheral vascular disease (HCC)    Syncope    , Vagal   Past Surgical History:  Procedure Laterality Date   AUGMENTATION MAMMAPLASTY Bilateral    BACK SURGERY     CARDIAC CATHETERIZATION N/A 05/07/2016   Procedure: Left Heart Cath and Coronary Angiography;  Surgeon: Lonni JONETTA Cash, MD;  Location: [facility] INVASIVE CV LAB;   Service: Cardiovascular;  Laterality: N/A;   CARDIOVERSION N/A 09/24/2017   Procedure: CARDIOVERSION;  Surgeon: Rolan Ezra RAMAN, MD;  Location: [facility] ENDOSCOPY;  Service: Cardiovascular;  Laterality: N/A;   DILATION AND CURETTAGE OF UTERUS     ENDARTERECTOMY FEMORAL Right 03/02/2015   Procedure: ENDARTERECTOMY RIGHT FEMORAL;  Surgeon: Lonni RAMAN Blade, MD;  Location: [facility] OR;  Service: Vascular;  Laterality: Right;   ESOPHAGOGASTRODUODENOSCOPY (EGD) WITH PROPOFOL  N/A 12/01/2020   Procedure: ESOPHAGOGASTRODUODENOSCOPY (EGD) WITH PROPOFOL ;  Surgeon: Paola Dreama SAILOR, MD;  Location: [facility] ENDOSCOPY;  Service: General;  Laterality: N/A;   FACIAL COSMETIC SURGERY Left 2002   related to Bell's Palsy @ age 55; left eye/side of face droopy; tried to make area symmetrical   FEMORAL-POPLITEAL BYPASS GRAFT Right 03/02/2015   Procedure: BYPASS GRAFT FEMORAL-BELOW KNEE POPLITEAL ARTERY;  Surgeon: Lonni RAMAN Blade, MD;  Location: [facility] OR;  Service: Vascular;  Laterality: Right;   INGUINAL HERNIA REPAIR Bilateral 2002   IR ANGIO INTRA EXTRACRAN SEL COM CAROTID INNOMINATE UNI L MOD SED  11/16/2020   IR CT HEAD LTD  11/13/2020   IR PERCUTANEOUS ART THROMBECTOMY/INFUSION INTRACRANIAL INC DIAG ANGIO  11/13/2020   OVARIAN CYST REMOVAL Left    PEG PLACEMENT N/A 12/01/2020   Procedure: PERCUTANEOUS ENDOSCOPIC GASTROSTOMY (PEG) PLACEMENT;  Surgeon: Paola Dreama SAILOR, MD;  Location: [facility] ENDOSCOPY;  Service: General;  Laterality: N/A;   PERIPHERAL VASCULAR CATHETERIZATION N/A 01/16/2015   Procedure: Abdominal Aortogram;  Surgeon: Lonni RAMAN Blade, MD;  Location: [facility] INVASIVE CV LAB;  Service: Cardiovascular;  Laterality: N/A;   POSTERIOR LUMBAR FUSION  2015   have plates and screws in there   RADIOLOGY WITH ANESTHESIA N/A 11/13/2020   Procedure: IR WITH ANESTHESIA;  Surgeon: Radiologist, Medication, MD;  Location: [facility] OR;  Service: Radiology;  Laterality: N/A;   TONSILLECTOMY      Allergies  Allergen Reactions    Amoxicillin Other (See Comments)    UTI Has patient had a PCN reaction causing immediate rash, facial/tongue/throat swelling, SOB or lightheadedness with hypotension: No Has patient had a PCN reaction causing severe rash involving mucus membranes or skin necrosis: No Has patient had a PCN reaction that required hospitalization: No Has patient had a PCN reaction occurring within the last 10 years: Yes--UTI ONLY If all of the above answers are NO, then may proceed with Cephalosporin use.    Atenolol Cough   Crestor  [Rosuvastatin  Calcium ] Other (See Comments)    Muscle aches   Pravastatin  Other (See Comments)    Muscle aches   Rosuvastatin  Calcium     Sulfa Antibiotics Nausea Only   Codeine Other (See Comments)    hallucinations    Outpatient Encounter Medications as of 01/05/2024  Medication Sig   acetaminophen  (TYLENOL ) 325 MG tablet Take 650 mg by mouth every 4 (  four) hours as needed.   albuterol  (VENTOLIN  HFA) 108 (90 Base) MCG/ACT inhaler Inhale 2 puffs into the lungs every 4 (four) hours as needed for wheezing or shortness of breath.   amiodarone  (PACERONE ) 100 MG tablet Take 100 mg by mouth daily.   amLODipine  (NORVASC ) 2.5 MG tablet Take 2.5 mg by mouth daily.   apixaban  (ELIQUIS ) 5 MG TABS tablet Take 5 mg by mouth 2 (two) times daily. May give crushed   Benzonatate  (TESSALON  PERLES PO) Take 200 mg by mouth as needed. 200mg , oral as needed, Tessalon  Perles, 200mg , PO TID PRN cough   bisacodyl  (DULCOLAX) 5 MG EC tablet Take 5 mg by mouth 2 (two) times daily as needed for moderate constipation.   buPROPion  (WELLBUTRIN  XL) 150 MG 24 hr tablet Take 150 mg by mouth daily.   Calcium -Vitamin D-Vitamin K (VIACTIV CALCIUM  PLUS D) 650-12.5-40 MG-MCG-MCG CHEW Chew 1 tablet by mouth daily.   Cholecalciferol (VITAMIN D3) 20 MCG (800 UNIT) TABS Take 1 tablet by mouth daily.   ciprofloxacin  (CIPRO ) 500 MG tablet Take 500 mg by mouth 2 (two) times daily.   denosumab (PROLIA) 60 MG/ML SOSY  injection Inject 60 mg into the skin every 6 (six) months.   diclofenac Sodium (VOLTAREN) 1 % GEL Apply 4 g topically every 6 (six) hours as needed. Apply one application to right hip and knee topically.   Emollient (CERAVE) CREA Apply 1 Application topically as needed.   Eyelid Cleansers (OCUSOFT EYELID CLEANSING) PADS Apply 1 Application topically daily.   ezetimibe  (ZETIA ) 10 MG tablet Take 10 mg by mouth daily. Oral if crushed   furosemide  (LASIX ) 20 MG tablet Take 20 mg by mouth daily as needed. Give Lasix  20mg  for weight gain equal or greater than 3 pounds   hydrALAZINE  (APRESOLINE ) 10 MG tablet Take 10 mg by mouth 3 (three) times daily as needed (SBP > 165 or DBP > 95).   hydrocortisone cream 1 % Apply 1 Application topically 2 (two) times daily as needed for itching.   ipratropium-albuterol  (DUONEB) 0.5-2.5 (3) MG/3ML SOLN Inhale 3 mLs into the lungs every 6 (six) hours as needed.   ketoconazole  (NIZORAL ) 2 % cream Apply 1 application  topically every 12 (twelve) hours as needed for irritation.   lidocaine  (LIDODERM ) 5 % Place 1 patch onto the skin daily. Remove & Discard patch within 12 hours or as directed by MD   methocarbamol  (ROBAXIN ) 500 MG tablet Take 250 mg by mouth every 6 (six) hours as needed.   ondansetron  (ZOFRAN ) 4 MG tablet Take 4 mg by mouth every 6 (six) hours as needed for nausea or vomiting.   OXYGEN  Inhale 3 L into the lungs as directed. to maintain sats > or equal to 90%. May titrate Three Times A Day; 07:00 AM - 03:00 PM, 03:00 PM - 11:00 PM, 11:00 PM - 7:00 am   pantoprazole  (PROTONIX ) 40 MG tablet Take 40 mg by mouth 2 (two) times daily.   Polyethyl Glycol-Propyl Glycol (SYSTANE) 0.4-0.3 % SOLN Apply 2 drops to eye every 6 (six) hours as needed (dry eyes).   polyethylene glycol powder (GLYCOLAX /MIRALAX ) 17 GM/SCOOP powder Take 17 g by mouth every other day.   sennosides-docusate sodium  (SENOKOT-S) 8.6-50 MG tablet Take 2 tablets by mouth 2 (two) times daily.    Tiotropium Bromide-Olodaterol (STIOLTO RESPIMAT ) 2.5-2.5 MCG/ACT AERS Inhale 2 puffs into the lungs daily.   zinc oxide 20 % ointment Apply 1 Application topically 2 (two) times daily as needed for irritation.  calcium  carbonate (CALCIUM  600) 600 MG TABS tablet Take 600 mg by mouth daily. (Patient not taking: Reported on 01/05/2024)   DOXYCYCLINE  HYCLATE PO Take 100 mg by mouth in the morning and at bedtime. (Patient not taking: Reported on 01/05/2024)   traMADol  (ULTRAM ) 50 MG tablet Take 50 mg by mouth 2 (two) times daily as needed. (Patient not taking: Reported on 01/05/2024)   No facility-administered encounter medications on file as of 01/05/2024.    Review of Systems  Immunization History  Administered Date(s) Administered   Fluad Quad(high Dose 65+) 02/05/2022, 01/08/2023   Fluzone Influenza virus vaccine,trivalent (IIV3), split virus 04/30/2010, 01/26/2016, 02/07/2018, 02/01/2020   INFLUENZA, HIGH DOSE SEASONAL PF 02/07/2018, 01/25/2019, 02/28/2021, 02/05/2022   Influenza Split 01/16/2009   Influenza,inj,Quad PF,6+ Mos 03/06/2012, 01/26/2016, 01/10/2017   Influenza,inj,quad, With Preservative 02/14/2015   Moderna Covid-19 Vaccine Bivalent Booster 35yrs & up 02/28/2021, 02/04/2023   PFIZER(Purple Top)SARS-COV-2 Vaccination 05/06/2019, 05/27/2019, 01/27/2020, 09/18/2020   Pneumococcal Conjugate-13 03/19/2017   Pneumococcal Polysaccharide-23 02/14/2015   Respiratory Syncytial Virus Vaccine,Recomb Aduvanted(Arexvy) 02/15/2022   Td 02/25/2005   Tdap 05/19/2013   Zoster, Live 07/28/2006   Pertinent  Health Maintenance Due  Topic Date Due   Influenza Vaccine  11/14/2023   Mammogram  Discontinued   DEXA SCAN  Discontinued      08/13/2022   11:02 AM 11/26/2022   12:34 PM 12/31/2022    3:26 PM 03/11/2023    2:48 PM 12/16/2023    3:28 PM  Fall Risk  Falls in the past year? 1 0 0 1 0  Was there an injury with Fall? 0 0 0 0 0  Fall Risk Category Calculator 1 0 0 1 0  Patient at Risk  for Falls Due to History of fall(s);Impaired balance/gait;Impaired mobility History of fall(s);Impaired balance/gait History of fall(s);Impaired balance/gait;Impaired mobility History of fall(s);Impaired balance/gait;Impaired mobility History of fall(s);Impaired balance/gait;Impaired mobility  Patient at Risk for Falls Due to - Comments right sided hemiparesis      Fall risk Follow up Falls evaluation completed;Education provided;Falls prevention discussed Falls evaluation completed;Education provided Falls evaluation completed;Education provided;Falls prevention discussed Falls evaluation completed;Education provided;Falls prevention discussed Falls evaluation completed   Functional Status Survey:    Vitals:   01/05/24 1045 01/05/24 1103  BP: (!) 158/73 (!) 141/76  Pulse: (!) 59   Resp: 16   Temp: (!) 97.2 F (36.2 C)   SpO2: 99%   Weight: 89 lb 12.8 oz (40.7 kg)   Height: 5' 4 (1.626 m)    Body mass index is 15.41 kg/m. Physical Exam  Labs reviewed: Recent Labs    03/18/23 0000 05/21/23 0000 10/15/23 0000  NA 140 142 141  K 4.4 4.2 4.9  CL 106 107 105  CO2 22 25* 25*  BUN 18 24* 34*  CREATININE 0.8 1.0 0.9  CALCIUM  10.0 8.9 9.2   Recent Labs    03/18/23 0000 05/21/23 0000 06/24/23 0000  AST 27 30 30   ALT 23 35 30  ALKPHOS 96 93 95  ALBUMIN  4.2 3.4* 3.0*   Recent Labs    03/18/23 0000 05/21/23 0000 10/15/23 0000  WBC 4.0 7.2 6.7  HGB 12.5 13.6 13.3  HCT 38 41 41  PLT 180 246 221   Lab Results  Component Value Date   TSH 0.61 08/08/2022   Lab Results  Component Value Date   HGBA1C 6.0 (H) 11/13/2020   Lab Results  Component Value Date   CHOL 164 03/18/2023   HDL 61 03/18/2023  LDLCALC 67 03/18/2023   TRIG 179 (A) 03/18/2023   CHOLHDL 3.0 11/13/2020    Significant Diagnostic Results in last 30 days:  No results found.  Assessment/Plan  1. Asymptomatic microscopic hematuria (Primary) Referral to urology for recurrent UTI and  hematuria. On Cipro  for Proteus UTI   2. PAF (paroxysmal atrial fibrillation) (HCC) Rate is controlled on amiodarone  Monitor labs.  On eliquis  for CVA risk reduction   3. Right hemiplegia (HCC) Works with therapy  4. Essential hypertension Improved.   5. Age-related osteoporosis with current pathological fracture, sequela On prolia   6. Mixed hyperlipidemia Continue zetia   7. Pulmonary emphysema, unspecified emphysema type (HCC) On stiolto  Followed by pulmonary   8. Chronic respiratory failure with hypoxia (HCC) Continue oxygen  continuous   9. Chronic diastolic congestive heart failure (HCC) Compensated  Continue lasix  prn

## 2024-02-09 ENCOUNTER — Telehealth: Payer: Self-pay | Admitting: Neurology

## 2024-02-09 NOTE — Telephone Encounter (Signed)
 [email or web address]  has called asking for pt's next Botox , call connected to Botox  coordinator

## 2024-02-16 ENCOUNTER — Encounter: Payer: Self-pay | Admitting: Internal Medicine

## 2024-02-16 ENCOUNTER — Non-Acute Institutional Stay (SKILLED_NURSING_FACILITY): Admitting: Internal Medicine

## 2024-02-16 DIAGNOSIS — G8191 Hemiplegia, unspecified affecting right dominant side: Secondary | ICD-10-CM

## 2024-02-16 DIAGNOSIS — F331 Major depressive disorder, recurrent, moderate: Secondary | ICD-10-CM

## 2024-02-16 DIAGNOSIS — J9611 Chronic respiratory failure with hypoxia: Secondary | ICD-10-CM

## 2024-02-16 DIAGNOSIS — I5032 Chronic diastolic (congestive) heart failure: Secondary | ICD-10-CM

## 2024-02-16 DIAGNOSIS — M8000XS Age-related osteoporosis with current pathological fracture, unspecified site, sequela: Secondary | ICD-10-CM

## 2024-02-16 DIAGNOSIS — K5901 Slow transit constipation: Secondary | ICD-10-CM

## 2024-02-16 DIAGNOSIS — R3121 Asymptomatic microscopic hematuria: Secondary | ICD-10-CM | POA: Diagnosis not present

## 2024-02-16 DIAGNOSIS — I10 Essential (primary) hypertension: Secondary | ICD-10-CM

## 2024-02-16 DIAGNOSIS — I480 Paroxysmal atrial fibrillation: Secondary | ICD-10-CM | POA: Diagnosis not present

## 2024-02-16 DIAGNOSIS — K219 Gastro-esophageal reflux disease without esophagitis: Secondary | ICD-10-CM

## 2024-02-16 DIAGNOSIS — E782 Mixed hyperlipidemia: Secondary | ICD-10-CM

## 2024-02-16 NOTE — Progress Notes (Unsigned)
 Location:   [facility]   Place of Service:  SNF   Provider:   Code Status: Full Code Goals of Care:     12/16/2023   12:22 PM  Advanced Directives  Does Patient Have a Medical Advance Directive? Yes  Type of Advance Directive Living will  Does patient want to make changes to medical advance directive? No - Patient declined     Chief Complaint  Patient presents with   Care Management    HPI: Patient is a 78 y.o. female seen today for medical management of chronic diseases.    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  Is able to walk with his assist.  Otherwise needs help with her ADLs and transfers is going to go to neurology for Botox  in that arm Hematuria Patient has had recurrent UTI and hematuria.  Awaiting follow-up with urology next week  Previous x-rays have shown multiple compression fractures Her back pain is now resolved DEXA in 7/24 showed Left radius of -3.3 Nothing else was measured  She iss now on Prolia  Wt Readings from Last 3 Encounters:  02/16/24 94 lb 12.8 oz (43 kg)  01/05/24 89 lb 12.8 oz (40.7 kg)  12/16/23 89 lb 12.8 oz (40.7 kg)    Past Medical History:  Diagnosis Date   Anxiety    Arthritis    some in my lower back; probably elbows, knees (11/18/2017)   Atrial fibrillation (HCC)    Bell's palsy    when pt. was 78 yrs old, when under stress the left side of face will droop.   Complication of anesthesia    vascular OR 2016; BP bottomed out; couldn't get it regulated; ended up in ICU for DAYS (11/18/2017)   GERD (gastroesophageal reflux disease)    History of kidney stones    Hypertension    Hypertrophic cardiomyopathy (HCC)    severe LV basilar hypertrophy witn no evidence of significant outflow tract obstruction, EF 65-70%, mild LAE, mild TR,  grade 1a diastolic dysfunction 05/15/10 (Dr. Oneil Parchment) (Atrial Septal Hypertrophy pattern)-- Intra-op TEE with dsignificant outflow tract obstruction - AI, MR & TR   Insomnia    Mild aortic sclerosis    Osteopenia    Peripheral vascular disease    Syncope    , Vagal    Past Surgical History:  Procedure Laterality Date   AUGMENTATION MAMMAPLASTY Bilateral    BACK SURGERY     CARDIAC CATHETERIZATION N/A 05/07/2016   Procedure: Left Heart Cath and Coronary Angiography;  Surgeon: Lonni JONETTA Cash, MD;  Location: [facility] INVASIVE CV LAB;  Service: Cardiovascular;  Laterality: N/A;   CARDIOVERSION N/A 09/24/2017   Procedure: CARDIOVERSION;  Surgeon: Rolan Ezra RAMAN, MD;  Location: [facility] ENDOSCOPY;  Service: Cardiovascular;  Laterality: N/A;   DILATION AND CURETTAGE OF UTERUS     ENDARTERECTOMY FEMORAL Right 03/02/2015   Procedure: ENDARTERECTOMY RIGHT FEMORAL;  Surgeon: Lonni RAMAN Blade, MD;  Location: [facility] OR;  Service: Vascular;  Laterality: Right;   ESOPHAGOGASTRODUODENOSCOPY (EGD) WITH PROPOFOL  N/A 12/01/2020   Procedure: ESOPHAGOGASTRODUODENOSCOPY (EGD) WITH PROPOFOL ;  Surgeon: Paola Dreama SAILOR, MD;  Location: [facility] ENDOSCOPY;  Service: General;  Laterality: N/A;   FACIAL COSMETIC SURGERY Left 2002   related to Bell's Palsy @ age  25; left eye/side of face droopy; tried to make area symmetrical   FEMORAL-POPLITEAL BYPASS GRAFT Right 03/02/2015   Procedure: BYPASS GRAFT FEMORAL-BELOW KNEE POPLITEAL ARTERY;  Surgeon: Lonni GORMAN Blade, MD;  Location: [facility] OR;  Service: Vascular;  Laterality: Right;   INGUINAL HERNIA REPAIR Bilateral 2002   IR ANGIO INTRA EXTRACRAN SEL COM CAROTID INNOMINATE UNI L MOD SED  11/16/2020   IR CT HEAD LTD  11/13/2020   IR PERCUTANEOUS ART THROMBECTOMY/INFUSION INTRACRANIAL INC DIAG ANGIO  11/13/2020   OVARIAN CYST REMOVAL Left    PEG PLACEMENT N/A 12/01/2020   Procedure: PERCUTANEOUS ENDOSCOPIC GASTROSTOMY (PEG) PLACEMENT;  Surgeon: Paola Dreama SAILOR, MD;  Location:  [facility] ENDOSCOPY;  Service: General;  Laterality: N/A;   PERIPHERAL VASCULAR CATHETERIZATION N/A 01/16/2015   Procedure: Abdominal Aortogram;  Surgeon: Lonni GORMAN Blade, MD;  Location: [facility] INVASIVE CV LAB;  Service: Cardiovascular;  Laterality: N/A;   POSTERIOR LUMBAR FUSION  2015   have plates and screws in there   RADIOLOGY WITH ANESTHESIA N/A 11/13/2020   Procedure: IR WITH ANESTHESIA;  Surgeon: Radiologist, Medication, MD;  Location: [facility] OR;  Service: Radiology;  Laterality: N/A;   TONSILLECTOMY      Allergies  Allergen Reactions   Amoxicillin Other (See Comments)    UTI Has patient had a PCN reaction causing immediate rash, facial/tongue/throat swelling, SOB or lightheadedness with hypotension: No Has patient had a PCN reaction causing severe rash involving mucus membranes or skin necrosis: No Has patient had a PCN reaction that required hospitalization: No Has patient had a PCN reaction occurring within the last 10 years: Yes--UTI ONLY If all of the above answers are NO, then may proceed with Cephalosporin use.    Atenolol Cough   Crestor  [Rosuvastatin  Calcium ] Other (See Comments)    Muscle aches   Pravastatin  Other (See Comments)    Muscle aches   Rosuvastatin  Calcium     Sulfa Antibiotics Nausea Only   Codeine Other (See Comments)    hallucinations    Outpatient Encounter Medications as of 02/16/2024  Medication Sig   acetaminophen  (TYLENOL ) 325 MG tablet Take 650 mg by mouth every 4 (four) hours as needed.   albuterol  (VENTOLIN  HFA) 108 (90 Base) MCG/ACT inhaler Inhale 2 puffs into the lungs every 4 (four) hours as needed for wheezing or shortness of breath.   amiodarone  (PACERONE ) 100 MG tablet Take 100 mg by mouth daily.   amLODipine  (NORVASC ) 2.5 MG tablet Take 2.5 mg by mouth daily.   apixaban  (ELIQUIS ) 5 MG TABS tablet Take 5 mg by mouth 2 (two) times daily. May give crushed   Benzonatate  (TESSALON  PERLES PO) Take 200 mg by mouth as needed. 200mg , oral as needed,  Tessalon  Perles, 200mg , PO TID PRN cough   bisacodyl  (DULCOLAX) 5 MG EC tablet Take 5 mg by mouth 2 (two) times daily as needed for moderate constipation.   buPROPion  (WELLBUTRIN  XL) 150 MG 24 hr tablet Take 150 mg by mouth daily.   Calcium -Vitamin D-Vitamin K (VIACTIV CALCIUM  PLUS D) 650-12.5-40 MG-MCG-MCG CHEW Chew 1 tablet by mouth daily.   Cholecalciferol (VITAMIN D3) 20 MCG (800 UNIT) TABS Take 1 tablet by mouth daily.   denosumab (PROLIA) 60 MG/ML SOSY injection Inject 60 mg into the skin every 6 (six) months.   diclofenac Sodium (VOLTAREN) 1 % GEL Apply 4 g topically every 6 (six) hours as needed. Apply one application to right hip and knee topically.   Emollient (CERAVE) CREA Apply 1 Application topically as needed.   Eyelid  Cleansers (OCUSOFT EYELID CLEANSING) PADS Apply 1 Application topically daily.   ezetimibe  (ZETIA ) 10 MG tablet Take 10 mg by mouth daily. Oral if crushed   furosemide  (LASIX ) 20 MG tablet Take 20 mg by mouth daily as needed. Give Lasix  20mg  for weight gain equal or greater than 3 pounds   hydrALAZINE  (APRESOLINE ) 10 MG tablet Take 10 mg by mouth 3 (three) times daily as needed (SBP > 165 or DBP > 95).   hydrocortisone cream 1 % Apply 1 Application topically 2 (two) times daily as needed for itching.   ipratropium-albuterol  (DUONEB) 0.5-2.5 (3) MG/3ML SOLN Inhale 3 mLs into the lungs every 6 (six) hours as needed.   ketoconazole  (NIZORAL ) 2 % cream Apply 1 application  topically every 12 (twelve) hours as needed for irritation.   lidocaine  (LIDODERM ) 5 % Place 1 patch onto the skin daily. Remove & Discard patch within 12 hours or as directed by MD   methocarbamol  (ROBAXIN ) 500 MG tablet Take 250 mg by mouth every 6 (six) hours as needed.   ondansetron  (ZOFRAN ) 4 MG tablet Take 4 mg by mouth every 6 (six) hours as needed for nausea or vomiting.   OXYGEN  Inhale 3 L into the lungs as directed. to maintain sats > or equal to 90%. May titrate Three Times A Day; 07:00 AM -  03:00 PM, 03:00 PM - 11:00 PM, 11:00 PM - 7:00 am   pantoprazole  (PROTONIX ) 40 MG tablet Take 40 mg by mouth 2 (two) times daily.   Polyethyl Glycol-Propyl Glycol (SYSTANE) 0.4-0.3 % SOLN Apply 2 drops to eye every 6 (six) hours as needed (dry eyes).   polyethylene glycol powder (GLYCOLAX /MIRALAX ) 17 GM/SCOOP powder Take 17 g by mouth every other day.   sennosides-docusate sodium  (SENOKOT-S) 8.6-50 MG tablet Take 2 tablets by mouth 2 (two) times daily.   Tiotropium Bromide-Olodaterol (STIOLTO RESPIMAT ) 2.5-2.5 MCG/ACT AERS Inhale 2 puffs into the lungs daily.   traMADol  (ULTRAM ) 50 MG tablet Take 50 mg by mouth 2 (two) times daily as needed. (Patient not taking: Reported on 01/05/2024)   zinc oxide 20 % ointment Apply 1 Application topically 2 (two) times daily as needed for irritation.   [DISCONTINUED] ciprofloxacin  (CIPRO ) 500 MG tablet Take 500 mg by mouth 2 (two) times daily.   No facility-administered encounter medications on file as of 02/16/2024.    Review of Systems:  Review of Systems  Constitutional:  Negative for activity change and appetite change.  HENT: Negative.    Respiratory:  Negative for cough and shortness of breath.   Cardiovascular:  Negative for leg swelling.  Gastrointestinal:  Negative for constipation.  Genitourinary: Negative.   Musculoskeletal:  Positive for gait problem. Negative for arthralgias and myalgias.  Skin: Negative.   Neurological:  Negative for dizziness and weakness.  Psychiatric/Behavioral:  Negative for confusion, dysphoric mood and sleep disturbance.     Health Maintenance  Topic Date Due   Zoster Vaccines- Shingrix (1 of 2) 04/24/1964   DTaP/Tdap/Td (3 - Td or Tdap) 05/20/2023   Medicare Annual Wellness (AWV)  08/13/2023   Influenza Vaccine  11/14/2023   COVID-19 Vaccine (7 - 2025-26 season) 12/15/2023   Lung Cancer Screening  09/21/2024   Pneumococcal Vaccine: 50+ Years  Completed   Hepatitis C Screening  Completed   Meningococcal B  Vaccine  Aged Out   Mammogram  Discontinued   DEXA SCAN  Discontinued    Physical Exam: Vitals:   02/16/24 1520  BP: (!) 118/58  Pulse: 62  Resp: 16  Temp: (!) 97.3 F (36.3 C)  Weight: 94 lb 12.8 oz (43 kg)   Body mass index is 16.27 kg/m. Physical Exam Vitals reviewed.  Constitutional:      Appearance: Normal appearance.  HENT:     Head: Normocephalic.     Nose: Nose normal.     Mouth/Throat:     Mouth: Mucous membranes are moist.     Pharynx: Oropharynx is clear.  Eyes:     Pupils: Pupils are equal, round, and reactive to light.  Cardiovascular:     Rate and Rhythm: Normal rate and regular rhythm.     Pulses: Normal pulses.     Heart sounds: Normal heart sounds. No murmur heard. Pulmonary:     Effort: Pulmonary effort is normal.     Breath sounds: Normal breath sounds.  Abdominal:     General: Abdomen is flat. Bowel sounds are normal.     Palpations: Abdomen is soft.  Musculoskeletal:        General: No swelling.     Cervical back: Neck supple.  Skin:    General: Skin is warm.  Neurological:     Mental Status: She is alert and oriented to person, place, and time.     Comments: Has expressive aphasia and Right Sided Hemiplegia  Psychiatric:        Mood and Affect: Mood normal.        Thought Content: Thought content normal.     Labs reviewed: Basic Metabolic Panel: Recent Labs    03/18/23 0000 05/21/23 0000 10/15/23 0000  NA 140 142 141  K 4.4 4.2 4.9  CL 106 107 105  CO2 22 25* 25*  BUN 18 24* 34*  CREATININE 0.8 1.0 0.9  CALCIUM  10.0 8.9 9.2   Liver Function Tests: Recent Labs    03/18/23 0000 05/21/23 0000 06/24/23 0000  AST 27 30 30   ALT 23 35 30  ALKPHOS 96 93 95  ALBUMIN  4.2 3.4* 3.0*   No results for input(s): LIPASE, AMYLASE in the last 8760 hours. No results for input(s): AMMONIA in the last 8760 hours. CBC: Recent Labs    03/18/23 0000 05/21/23 0000 10/15/23 0000  WBC 4.0 7.2 6.7  HGB 12.5 13.6 13.3  HCT 38 41  41  PLT 180 246 221   Lipid Panel: Recent Labs    03/18/23 0000  CHOL 164  HDL 61  LDLCALC 67  TRIG 179*   Lab Results  Component Value Date   HGBA1C 6.0 (H) 11/13/2020    Procedures since last visit: No results found.  Assessment/Plan 1. Asymptomatic microscopic hematuria (Primary)Urology Appointment pending  2. PAF (paroxysmal atrial fibrillation) (HCC) Eliquis  and Amiodarone   3. Right hemiplegia (HCC) Works with thetherapy 2/week Botox  PRn  4. Essential hypertension Norvasc   5. Age-related osteoporosis with current pathological fracture, sequela Now on Prolia  6. Mixed hyperlipidemia Zetia   7. Chronic diastolic congestive heart failure (HCC) Lasix  PRn  8. COPD Stiolto  9. Moderate episode of recurrent major depressive disorder (HCC) Wellbutrin  Doing well  10. Slow transit constipation Senna  11. Gastroesophageal reflux disease without esophagitis Protonix  BID    Labs/tests ordered:  Lipid,TSH,Vit D level,CBC,CMP Next appt:  Visit date not found

## 2024-02-18 ENCOUNTER — Ambulatory Visit: Admitting: Neurology

## 2024-02-18 LAB — TSH: TSH: 0.76 (ref 0.41–5.90)

## 2024-02-18 LAB — COMPREHENSIVE METABOLIC PANEL WITH GFR
Albumin: 3.7 (ref 3.5–5.0)
Calcium: 8.9 (ref 8.7–10.7)
Globulin: 2.2
eGFR: 58

## 2024-02-18 LAB — CBC AND DIFFERENTIAL
HCT: 42 (ref 36–46)
Hemoglobin: 13.5 (ref 12.0–16.0)
Neutrophils Absolute: 4.3
Platelets: 234 K/uL (ref 150–400)
WBC: 7.1

## 2024-02-18 LAB — LIPID PANEL
Cholesterol: 182 (ref 0–200)
HDL: 78 — AB (ref 35–70)
LDL Cholesterol: 85
LDl/HDL Ratio: 2.3
Triglycerides: 96 (ref 40–160)

## 2024-02-18 LAB — BASIC METABOLIC PANEL WITH GFR
BUN: 39 — AB (ref 4–21)
CO2: 24 — AB (ref 13–22)
Chloride: 104 (ref 99–108)
Creatinine: 1 (ref 0.5–1.1)
Glucose: 164
Potassium: 4.5 meq/L (ref 3.5–5.1)
Sodium: 143 (ref 137–147)

## 2024-02-18 LAB — HEPATIC FUNCTION PANEL
ALT: 17 U/L (ref 7–35)
AST: 22 (ref 13–35)
Alkaline Phosphatase: 64 (ref 25–125)
Bilirubin, Total: 0.3

## 2024-02-18 LAB — VITAMIN D 25 HYDROXY (VIT D DEFICIENCY, FRACTURES): Vit D, 25-Hydroxy: 21.1

## 2024-02-18 LAB — CBC: RBC: 4.48 (ref 3.87–5.11)

## 2024-02-19 ENCOUNTER — Telehealth: Payer: Self-pay | Admitting: Adult Health

## 2024-02-20 LAB — HM MAMMOGRAPHY

## 2024-02-20 MED ORDER — VITAMIN D (ERGOCALCIFEROL) 1.25 MG (50000 UNIT) PO CAPS: 50000.0000 [IU] | ORAL_CAPSULE | ORAL | Status: AC

## 2024-02-20 NOTE — Telephone Encounter (Signed)
 Lab result Vit D level 21.1, already on lower dose vit d, will add 6 week 50,000 units of vit d and recheck level Glucose was 167 but upon review this lab was not done fasting.  Discussed with pt and her husband at bedside

## 2024-02-23 ENCOUNTER — Encounter: Payer: Self-pay | Admitting: Internal Medicine

## 2024-03-02 ENCOUNTER — Encounter: Payer: Self-pay | Admitting: Orthopedic Surgery

## 2024-03-02 ENCOUNTER — Non-Acute Institutional Stay (SKILLED_NURSING_FACILITY): Admitting: Orthopedic Surgery

## 2024-03-02 DIAGNOSIS — I480 Paroxysmal atrial fibrillation: Secondary | ICD-10-CM

## 2024-03-02 DIAGNOSIS — R031 Nonspecific low blood-pressure reading: Secondary | ICD-10-CM

## 2024-03-02 DIAGNOSIS — K08409 Partial loss of teeth, unspecified cause, unspecified class: Secondary | ICD-10-CM | POA: Diagnosis not present

## 2024-03-02 NOTE — Progress Notes (Signed)
 Location:  [facility] Room Number: 116/P Place of Service:  SNF [phone]) Provider:  Greig FORBES Cluster, NP   [name], MD  Patient Care Team: [name], MD as PCP - General (Internal Medicine) Jeffrie Oneil BROCKS, MD as PCP - Cardiology (Cardiology) [name], MD (Inactive) as PCP - Electrophysiology (Cardiology) Alix Charleston, MD as Consulting Physician (Neurosurgery)  Extended Emergency Contact Information Primary Emergency Contact: Li,Beverly G Address: [address]          [place] [id number] [place] Home Phone: [phone] Mobile Phone: [phone] Relation: Spouse Secondary Emergency Contact: Li,Beverly Address: [address]          [place], [place] [address] [place] Mobile Phone: [phone] Relation: Son  Code Status: Full code Goals of care: Advanced Directive information    12/16/2023   12:22 PM  Advanced Directives  Does Patient Have a Medical Advance Directive? Yes  Type of Advance Directive Living will  Does patient want to make changes to medical advance directive? No - Patient declined     Chief Complaint  Patient presents with   Acute Visit    Low blood pressure    HPI:  Pt is a 78 y.o. female seen today for acute visit due to low blood pressures.   She currently resides on the skilled nursing unit at [facility]. 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   11/2020 h/o acute left MCA stoke with right sided weakness. Nursing reports low blood pressure 80/60 and irregular heart rate. H/o PAF. She is taking amlodipine , amiodarone  and eliquis . 11/14 she had tooth extraction. She had a moderate amount of bleeding after procedure. Bleeding has subsided. She was given norco for pain but it does not appear she has used it per chart review. She  admits to using ibuprofen and tylenol . She has also been taking keflex x 2 days per dentist. Denies chest pain, shortness of breath, dizziness of headache. She admits to feeling lousy. Afebrile. Rechecked bp was 92/68 during encounter.      Past Medical History:  Diagnosis Date   Anxiety    Arthritis    some in my lower back; probably elbows, knees (11/18/2017)   Atrial fibrillation (HCC)    Bell's palsy    when pt. was 78 yrs old, when under stress the left side of face will droop.   Complication of anesthesia    vascular OR 2016; BP bottomed out; couldn't get it regulated; ended up in ICU for DAYS (11/18/2017)   GERD (gastroesophageal reflux disease)    History of kidney stones    Hypertension    Hypertrophic cardiomyopathy (HCC)    severe LV basilar hypertrophy witn no evidence of significant outflow tract obstruction, EF 65-70%, mild LAE, mild TR, grade 1a diastolic dysfunction 05/15/10 (Dr. Oneil Jeffrie) (Atrial Septal Hypertrophy pattern)-- Intra-op TEE with dsignificant outflow tract obstruction - AI, MR & TR   Insomnia    Mild aortic sclerosis    Osteopenia    Peripheral vascular disease    Syncope    , Vagal   Past Surgical History:  Procedure Laterality Date   AUGMENTATION MAMMAPLASTY Bilateral    BACK SURGERY     CARDIAC CATHETERIZATION N/A 05/07/2016   Procedure: Left Heart Cath and Coronary Angiography;  Surgeon: Lonni JONETTA Cash, MD;  Location: [facility] INVASIVE CV  LAB;  Service: Cardiovascular;  Laterality: N/A;   CARDIOVERSION N/A 09/24/2017   Procedure: CARDIOVERSION;  Surgeon: Rolan Ezra RAMAN, MD;  Location: [facility] ENDOSCOPY;  Service: Cardiovascular;  Laterality: N/A;   DILATION AND CURETTAGE OF UTERUS     ENDARTERECTOMY FEMORAL Right 03/02/2015   Procedure: ENDARTERECTOMY RIGHT FEMORAL;  Surgeon: Lonni RAMAN Blade, MD;  Location: [facility] OR;  Service: Vascular;  Laterality: Right;   ESOPHAGOGASTRODUODENOSCOPY (EGD) WITH PROPOFOL  N/A 12/01/2020   Procedure:  ESOPHAGOGASTRODUODENOSCOPY (EGD) WITH PROPOFOL ;  Surgeon: Paola Dreama SAILOR, MD;  Location: [facility] ENDOSCOPY;  Service: General;  Laterality: N/A;   FACIAL COSMETIC SURGERY Left 2002   related to Bell's Palsy @ age 55; left eye/side of face droopy; tried to make area symmetrical   FEMORAL-POPLITEAL BYPASS GRAFT Right 03/02/2015   Procedure: BYPASS GRAFT FEMORAL-BELOW KNEE POPLITEAL ARTERY;  Surgeon: Lonni RAMAN Blade, MD;  Location: [facility] OR;  Service: Vascular;  Laterality: Right;   INGUINAL HERNIA REPAIR Bilateral 2002   IR ANGIO INTRA EXTRACRAN SEL COM CAROTID INNOMINATE UNI L MOD SED  11/16/2020   IR CT HEAD LTD  11/13/2020   IR PERCUTANEOUS ART THROMBECTOMY/INFUSION INTRACRANIAL INC DIAG ANGIO  11/13/2020   OVARIAN CYST REMOVAL Left    PEG PLACEMENT N/A 12/01/2020   Procedure: PERCUTANEOUS ENDOSCOPIC GASTROSTOMY (PEG) PLACEMENT;  Surgeon: Paola Dreama SAILOR, MD;  Location: [facility] ENDOSCOPY;  Service: General;  Laterality: N/A;   PERIPHERAL VASCULAR CATHETERIZATION N/A 01/16/2015   Procedure: Abdominal Aortogram;  Surgeon: Lonni RAMAN Blade, MD;  Location: [facility] INVASIVE CV LAB;  Service: Cardiovascular;  Laterality: N/A;   POSTERIOR LUMBAR FUSION  2015   have plates and screws in there   RADIOLOGY WITH ANESTHESIA N/A 11/13/2020   Procedure: IR WITH ANESTHESIA;  Surgeon: Radiologist, Medication, MD;  Location: [facility] OR;  Service: Radiology;  Laterality: N/A;   TONSILLECTOMY      Allergies  Allergen Reactions   Amoxicillin Other (See Comments)    UTI Has patient had a PCN reaction causing immediate rash, facial/tongue/throat swelling, SOB or lightheadedness with hypotension: No Has patient had a PCN reaction causing severe rash involving mucus membranes or skin necrosis: No Has patient had a PCN reaction that required hospitalization: No Has patient had a PCN reaction occurring within the last 10 years: Yes--UTI ONLY If all of the above answers are NO, then may proceed with Cephalosporin use.     Atenolol Cough   Crestor  [Rosuvastatin  Calcium ] Other (See Comments)    Muscle aches   Pravastatin  Other (See Comments)    Muscle aches   Rosuvastatin  Calcium     Sulfa Antibiotics Nausea Only   Codeine Other (See Comments)    hallucinations    Outpatient Encounter Medications as of 03/02/2024  Medication Sig   acetaminophen  (TYLENOL ) 325 MG tablet Take 650 mg by mouth every 4 (four) hours as needed.   albuterol  (VENTOLIN  HFA) 108 (90 Base) MCG/ACT inhaler Inhale 2 puffs into the lungs every 4 (four) hours as needed for wheezing or shortness of breath.   amiodarone  (PACERONE ) 100 MG tablet Take 100 mg by mouth daily.   amLODipine  (NORVASC ) 2.5 MG tablet Take 2.5 mg by mouth daily.   apixaban  (ELIQUIS ) 5 MG TABS tablet Take 5 mg by mouth 2 (two) times daily. May give crushed   Benzonatate  (TESSALON  PERLES PO) Take 200 mg by mouth as needed. 200mg , oral as needed, Tessalon  Perles, 200mg , PO TID PRN cough   bisacodyl  (DULCOLAX) 5 MG EC tablet Take 5 mg by mouth 2 (two) times  daily as needed for moderate constipation.   buPROPion  (WELLBUTRIN  XL) 150 MG 24 hr tablet Take 150 mg by mouth daily.   Calcium -Vitamin D-Vitamin K (VIACTIV CALCIUM  PLUS D) 650-12.5-40 MG-MCG-MCG CHEW Chew 1 tablet by mouth daily.   Cholecalciferol (VITAMIN D3) 20 MCG (800 UNIT) TABS Take 1 tablet by mouth daily.   denosumab (PROLIA) 60 MG/ML SOSY injection Inject 60 mg into the skin every 6 (six) months.   diclofenac Sodium (VOLTAREN) 1 % GEL Apply 4 g topically every 6 (six) hours as needed. Apply one application to right hip and knee topically.   Emollient (CERAVE) CREA Apply 1 Application topically as needed.   Eyelid Cleansers (OCUSOFT EYELID CLEANSING) PADS Apply 1 Application topically daily.   ezetimibe  (ZETIA ) 10 MG tablet Take 10 mg by mouth daily. Oral if crushed   furosemide  (LASIX ) 20 MG tablet Take 20 mg by mouth daily as needed. Give Lasix  20mg  for weight gain equal or greater than 3 pounds   hydrALAZINE   (APRESOLINE ) 10 MG tablet Take 10 mg by mouth 3 (three) times daily as needed (SBP > 165 or DBP > 95).   hydrocortisone cream 1 % Apply 1 Application topically 2 (two) times daily as needed for itching.   ipratropium-albuterol  (DUONEB) 0.5-2.5 (3) MG/3ML SOLN Inhale 3 mLs into the lungs every 6 (six) hours as needed.   ketoconazole  (NIZORAL ) 2 % cream Apply 1 application  topically every 12 (twelve) hours as needed for irritation.   lidocaine  (LIDODERM ) 5 % Place 1 patch onto the skin daily. Remove & Discard patch within 12 hours or as directed by MD   methocarbamol  (ROBAXIN ) 500 MG tablet Take 250 mg by mouth every 6 (six) hours as needed.   ondansetron  (ZOFRAN ) 4 MG tablet Take 4 mg by mouth every 6 (six) hours as needed for nausea or vomiting.   OXYGEN  Inhale 3 L into the lungs as directed. to maintain sats > or equal to 90%. May titrate Three Times A Day; 07:00 AM - 03:00 PM, 03:00 PM - 11:00 PM, 11:00 PM - 7:00 am   pantoprazole  (PROTONIX ) 40 MG tablet Take 40 mg by mouth 2 (two) times daily.   Polyethyl Glycol-Propyl Glycol (SYSTANE) 0.4-0.3 % SOLN Apply 2 drops to eye every 6 (six) hours as needed (dry eyes).   polyethylene glycol powder (GLYCOLAX /MIRALAX ) 17 GM/SCOOP powder Take 17 g by mouth every other day.   sennosides-docusate sodium  (SENOKOT-S) 8.6-50 MG tablet Take 2 tablets by mouth 2 (two) times daily.   Tiotropium Bromide-Olodaterol (STIOLTO RESPIMAT ) 2.5-2.5 MCG/ACT AERS Inhale 2 puffs into the lungs daily.   Vitamin D, Ergocalciferol, (DRISDOL) 1.25 MG (50000 UNIT) CAPS capsule Take 1 capsule (50,000 Units total) by mouth every 7 (seven) days for 6 doses.   zinc oxide 20 % ointment Apply 1 Application topically 2 (two) times daily as needed for irritation.   No facility-administered encounter medications on file as of 03/02/2024.    Review of Systems  Constitutional:  Positive for fatigue. Negative for fever.  HENT:  Positive for dental problem. Negative for sore throat and  trouble swallowing.   Eyes:  Negative for visual disturbance.  Respiratory:  Negative for cough and shortness of breath.   Cardiovascular:  Negative for chest pain, palpitations and leg swelling.  Gastrointestinal:  Negative for abdominal distention and abdominal pain.  Genitourinary:  Negative for hematuria.  Musculoskeletal:  Positive for gait problem.  Skin:  Positive for wound.  Neurological:  Positive for weakness. Negative for dizziness  and headaches.  Psychiatric/Behavioral:  Negative for dysphoric mood. The patient is not nervous/anxious.     Immunization History  Administered Date(s) Administered   Fluad Quad(high Dose 65+) 02/05/2022, 01/08/2023   Fluzone Influenza virus vaccine,trivalent (IIV3), split virus 04/30/2010, 01/26/2016, 02/07/2018, 02/01/2020   INFLUENZA, HIGH DOSE SEASONAL PF 02/07/2018, 01/25/2019, 02/28/2021, 02/05/2022   Influenza Split 01/16/2009   Influenza,inj,Quad PF,6+ Mos 03/06/2012, 01/26/2016, 01/10/2017   Influenza,inj,quad, With Preservative 02/14/2015   Moderna Covid-19 Vaccine Bivalent Booster 85yrs & up 02/28/2021, 02/04/2023   PFIZER(Purple Top)SARS-COV-2 Vaccination 05/06/2019, 05/27/2019, 01/27/2020, 09/18/2020   Pneumococcal Conjugate-13 03/19/2017   Pneumococcal Polysaccharide-23 02/14/2015   Respiratory Syncytial Virus Vaccine,Recomb Aduvanted(Arexvy) 02/15/2022   Td 02/25/2005   Tdap 05/19/2013   Zoster, Live 07/28/2006   Pertinent  Health Maintenance Due  Topic Date Due   Influenza Vaccine  11/14/2023   Mammogram  Discontinued   DEXA SCAN  Discontinued      08/13/2022   11:02 AM 11/26/2022   12:34 PM 12/31/2022    3:26 PM 03/11/2023    2:48 PM 12/16/2023    3:28 PM  Fall Risk  Falls in the past year? 1 0 0 1 0  Was there an injury with Fall? 0 0 0 0 0  Fall Risk Category Calculator 1 0 0 1 0  Patient at Risk for Falls Due to History of fall(s);Impaired balance/gait;Impaired mobility History of fall(s);Impaired balance/gait  History of fall(s);Impaired balance/gait;Impaired mobility History of fall(s);Impaired balance/gait;Impaired mobility History of fall(s);Impaired balance/gait;Impaired mobility  Patient at Risk for Falls Due to - Comments right sided hemiparesis      Fall risk Follow up Falls evaluation completed;Education provided;Falls prevention discussed Falls evaluation completed;Education provided Falls evaluation completed;Education provided;Falls prevention discussed Falls evaluation completed;Education provided;Falls prevention discussed Falls evaluation completed   Functional Status Survey:    Vitals:   03/02/24 1443  BP: (!) 80/60  Pulse: 94  Resp: 17  Temp: (!) 97.3 F (36.3 C)  SpO2: 95%  Weight: 94 lb 12.8 oz (43 kg)  Height: 5' 4 (1.626 m)   Body mass index is 16.27 kg/m. Physical Exam Vitals reviewed.  Constitutional:      General: She is not in acute distress.    Appearance: She is not ill-appearing.  HENT:     Head: Normocephalic.     Mouth/Throat:     Comments: No bleeding or swelling noted in mouth Eyes:     General:        Right eye: No discharge.        Left eye: No discharge.  Cardiovascular:     Rate and Rhythm: Normal rate. Rhythm irregular.     Pulses: Normal pulses.     Heart sounds: Normal heart sounds.  Pulmonary:     Effort: Pulmonary effort is normal. No respiratory distress.     Breath sounds: Normal breath sounds. No wheezing or rales.  Abdominal:     Palpations: Abdomen is soft.  Musculoskeletal:     Cervical back: Neck supple.     Right lower leg: No edema.     Left lower leg: No edema.  Skin:    General: Skin is warm.     Capillary Refill: Capillary refill takes less than 2 seconds.  Neurological:     General: No focal deficit present.     Mental Status: She is alert. Mental status is at baseline.     Gait: Gait abnormal.     Comments: Right hemiparesis  Psychiatric:  Mood and Affect: Mood normal.     Labs reviewed: Recent Labs     03/18/23 0000 05/21/23 0000 10/15/23 0000  NA 140 142 141  K 4.4 4.2 4.9  CL 106 107 105  CO2 22 25* 25*  BUN 18 24* 34*  CREATININE 0.8 1.0 0.9  CALCIUM  10.0 8.9 9.2   Recent Labs    03/18/23 0000 05/21/23 0000 06/24/23 0000  AST 27 30 30   ALT 23 35 30  ALKPHOS 96 93 95  ALBUMIN  4.2 3.4* 3.0*   Recent Labs    03/18/23 0000 05/21/23 0000 10/15/23 0000  WBC 4.0 7.2 6.7  HGB 12.5 13.6 13.3  HCT 38 41 41  PLT 180 246 221   Lab Results  Component Value Date   TSH 0.61 08/08/2022   Lab Results  Component Value Date   HGBA1C 6.0 (H) 11/13/2020   Lab Results  Component Value Date   CHOL 164 03/18/2023   HDL 61 03/18/2023   LDLCALC 67 03/18/2023   TRIG 179 (A) 03/18/2023   CHOLHDL 3.0 11/13/2020    Significant Diagnostic Results in last 30 days:  No results found.  Assessment/Plan 1. Low blood pressure reading (Primary) - 80/60 earlier> rechecked manual/ small cuff> 92/68 - on low dose amlodipine  and amiodarone  - encourage hydration  - manual blood pressures BID x 3 days - hold if amlodipine  if SBP < 100- nursing noted to contact cardiology  2. PAF (paroxysmal atrial fibrillation) (HCC) - HR< 100 with amiodarone  - cont Eliquis  for clot prevention - TSH 0.76 02/18/2024  - pulse normally 50-70's - repeat EKG   3. S/P tooth extraction - noted 11/14 - pain controlled with tylenol  and ibuprofen - bleeding has subsided     Family/ staff Communication: plan discussed with patient, husband and nurse  Labs/tests ordered:  EKG, manual BP x 3 days

## 2024-03-03 ENCOUNTER — Ambulatory Visit: Admitting: Neurology

## 2024-03-03 VITALS — BP 126/77

## 2024-03-03 DIAGNOSIS — G8111 Spastic hemiplegia affecting right dominant side: Secondary | ICD-10-CM | POA: Diagnosis not present

## 2024-03-03 DIAGNOSIS — I6522 Occlusion and stenosis of left carotid artery: Secondary | ICD-10-CM

## 2024-03-03 DIAGNOSIS — G8191 Hemiplegia, unspecified affecting right dominant side: Secondary | ICD-10-CM

## 2024-03-03 DIAGNOSIS — I63132 Cerebral infarction due to embolism of left carotid artery: Secondary | ICD-10-CM

## 2024-03-03 DIAGNOSIS — I6521 Occlusion and stenosis of right carotid artery: Secondary | ICD-10-CM

## 2024-03-03 MED ORDER — ONABOTULINUMTOXINA 200 UNITS IJ SOLR
200.0000 [IU] | Freq: Once | INTRAMUSCULAR | Status: AC
  Administered 2024-03-03: 200 [IU] via INTRAMUSCULAR

## 2024-03-03 NOTE — Progress Notes (Signed)
 Chief Complaint  Patient presents with   Injections    Rm 15, husband present, Pt is well and ready for injection      ASSESSMENT AND PLAN  Beverly Li is a 78 y.o. 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  Stroke happened while she was off Eliquis  for 1 week for abdominal hematoma, with history of atrial fibrillation  Now with residual aphasia, right hemiparesis,  Electrical stimulation guided Botox  a injection for spastic right upper extremity (100 units of Botox  a dissolving to 2 cc of normal saline), 1st injection was on May 22 2022  Right pectoralis major 50 units Right latissimus dorsi 50 units Right flexor digitorum profundus 25  units Right pronator teres 25 units Right biceps 25 units  units  Right brachialis 25 units   DIAGNOSTIC DATA (LABS, IMAGING, TESTING) - I reviewed patient records, labs, notes, testing and imaging myself where available.   MEDICAL HISTORY:  Beverly Li is a 78 year old female, accompanied by her husband, seen in request by his primary care at [facility] Dr. Charlanne Cambridge for evaluation of botulism toxin injection for spastic right hemiparesis, she is also seen by our clinic for stroke follow-up   I reviewed and summarized the referring note.PMHx. A fib,  Eliquis  HTN Peripheral vascular disease. COPD   She suffered stroke on November 13, 2020 presenting with right-sided weakness, right facial droop, left gaze preference,  MRI showed left ACA/MCA stroke, status post IR with TICI, reperfusion of left MCA, in the setting of paroxysmal atrial fibrillation taking off Eliquis  for 1 week due to abdominal hematoma  CT angiogram head and neck and four-vessel angiogram postprocedure showed status post revascularization of the occluded left middle cerebral artery M1 segment, and proximal flow arrest achieved TICI 3 revascularization, severe preocclusive stenosis of the proximal right internal carotid artery associated with a delayed  strain signs,  Known history of peripheral vascular disease, CT angiogram November 25, 2020 suspicious for occlusion of distal right femoral-popliteal bypass, low cardiac output, and distal high-grade stenosis/resistance also contributed to the unopacified distal bypass conduit, Absent of right side tibial arterial opacification, moderate to advanced aortic atherosclerosis bilateral iliac arterial disease, with no evidence of high-grade aortoiliac disease.  Echocardiogram in March 2023, ejection fraction 60 to 65%, no regional wall motion abnormality, moderate asymmetric left ventricular hypertrophy of the basal septal segment  She is now at [facility], with right hemiparesis, aphasia, husband reported that she continued to make small progress even now, physical therapy have suggested botulism toxin injection to alleviate her right shoulder and arm weakness   UPDATE May 22 2022: She is accompanied by her husband, receiving first electrical stimulation guided Botox  injection for spastic right upper extremity, use of Botox  a 100 units x 2, potential side effect explained,  UPDATE Aug 21 2022: She responded well to previous injection, there was no significant side effect noticed, tolerating Botox  a 100 units x 2,  UPDATE August 14th 2024: She is accompanied by her husband at today's clinical visit, overall doing well, no side effect noted, every 3 months Botox  injection has relaxed right shoulder, elbow,  UPDATE Feb 26 2023: Previous injection has been helpful, no side effect noted.  UPDATE May 26 2023: She tolerated the injection well, has better range of motion of right arm  UPDATE October 01 2023: Repeat injection every few months did help losing her right arm and shoulder has better range of motion  Update March 03, 2024  Every 3 months injection was helpful, she had a better range of motion of right shoulder  PHYSICAL EXAM:      03/03/2024    3:28 PM 03/02/2024    2:46 PM  03/02/2024    2:43 PM  Vitals with BMI  Height   5' 4  Weight   94 lbs 13 oz  BMI   16.26  Systolic 126 92 80  Diastolic 77 68 60  Pulse   94     PHYSICAL EXAMNIATION:  Gen: NAD, conversant, well nourised, well groomed                     Cardiovascular: Regular rate rhythm, no peripheral edema, warm, nontender. Eyes: Conjunctivae clear without exudates or hemorrhage Neck: Supple, no carotid bruits. Pulmonary: Clear to auscultation bilaterally   NEUROLOGICAL EXAM:  MENTAL STATUS: Speech/cognition: Global aphasia, paraphasic errors, slow in comprehension, word finding difficulties CRANIAL NERVES: CN II: Visual fields are full to confrontation. Pupils are round equal and briskly reactive to light. CN III, IV, VI: extraocular movement are normal. No ptosis. CN V: Facial sensation is intact to light touch CN VII: Right lower face weakness CN VIII: Hearing is normal to causal conversation. CN IX, X: Phonation is normal. CN XI: Head turning and shoulder shrug are intact  MOTOR: Right hemiparesis, mild spasticity of right upper extremity, no  right upper extremity antigravity movement, good range of motion of right shoulder, elbow, tight right pectoralis muscle, latissimus dorsi, tendency for right thumb in  REVIEW OF SYSTEMS:  Full 14 system review of systems performed and notable only for as above All other review of systems were negative.   ALLERGIES: Allergies  Allergen Reactions   Amoxicillin Other (See Comments)    UTI Has patient had a PCN reaction causing immediate rash, facial/tongue/throat swelling, SOB or lightheadedness with hypotension: No Has patient had a PCN reaction causing severe rash involving mucus membranes or skin necrosis: No Has patient had a PCN reaction that required hospitalization: No Has patient had a PCN reaction occurring within the last 10 years: Yes--UTI ONLY If all of the above answers are NO, then may proceed with Cephalosporin use.     Atenolol Cough   Crestor  [Rosuvastatin  Calcium ] Other (See Comments)    Muscle aches   Pravastatin  Other (See Comments)    Muscle aches   Rosuvastatin  Calcium     Sulfa Antibiotics Nausea Only   Codeine Other (See Comments)    hallucinations    HOME MEDICATIONS: Current Outpatient Medications  Medication Sig Dispense Refill   acetaminophen  (TYLENOL ) 325 MG tablet Take 650 mg by mouth every 4 (four) hours as needed.     albuterol  (VENTOLIN  HFA) 108 (90 Base) MCG/ACT inhaler Inhale 2 puffs into the lungs every 4 (four) hours as needed for wheezing or shortness of breath.     amiodarone  (PACERONE ) 100 MG tablet Take 100 mg by mouth daily.     amLODipine  (NORVASC ) 2.5 MG tablet Take 2.5 mg by mouth daily.     apixaban  (ELIQUIS ) 5 MG TABS tablet Take 5 mg by mouth 2 (two) times daily. May give crushed     Benzonatate  (TESSALON  PERLES PO) Take 200 mg by mouth as needed. 200mg , oral as needed, Tessalon  Perles, 200mg , PO TID PRN cough     bisacodyl  (DULCOLAX) 5 MG EC tablet Take 5 mg by mouth 2 (two) times daily as needed for moderate constipation.     buPROPion  (WELLBUTRIN  XL) 150 MG 24 hr  tablet Take 150 mg by mouth daily.     Calcium -Vitamin D-Vitamin K (VIACTIV CALCIUM  PLUS D) 650-12.5-40 MG-MCG-MCG CHEW Chew 1 tablet by mouth daily.     Cholecalciferol (VITAMIN D3) 20 MCG (800 UNIT) TABS Take 1 tablet by mouth daily.     denosumab (PROLIA) 60 MG/ML SOSY injection Inject 60 mg into the skin every 6 (six) months.     diclofenac Sodium (VOLTAREN) 1 % GEL Apply 4 g topically every 6 (six) hours as needed. Apply one application to right hip and knee topically.     Emollient (CERAVE) CREA Apply 1 Application topically as needed.     Eyelid Cleansers (OCUSOFT EYELID CLEANSING) PADS Apply 1 Application topically daily.     ezetimibe  (ZETIA ) 10 MG tablet Take 10 mg by mouth daily. Oral if crushed     furosemide  (LASIX ) 20 MG tablet Take 20 mg by mouth daily as needed. Give Lasix  20mg  for weight gain  equal or greater than 3 pounds     hydrALAZINE  (APRESOLINE ) 10 MG tablet Take 10 mg by mouth 3 (three) times daily as needed (SBP > 165 or DBP > 95).     hydrocortisone cream 1 % Apply 1 Application topically 2 (two) times daily as needed for itching.     ipratropium-albuterol  (DUONEB) 0.5-2.5 (3) MG/3ML SOLN Inhale 3 mLs into the lungs every 6 (six) hours as needed.     ketoconazole  (NIZORAL ) 2 % cream Apply 1 application  topically every 12 (twelve) hours as needed for irritation.     lidocaine  (LIDODERM ) 5 % Place 1 patch onto the skin daily. Remove & Discard patch within 12 hours or as directed by MD     methocarbamol  (ROBAXIN ) 500 MG tablet Take 250 mg by mouth every 6 (six) hours as needed.     ondansetron  (ZOFRAN ) 4 MG tablet Take 4 mg by mouth every 6 (six) hours as needed for nausea or vomiting.     OXYGEN  Inhale 3 L into the lungs as directed. to maintain sats > or equal to 90%. May titrate Three Times A Day; 07:00 AM - 03:00 PM, 03:00 PM - 11:00 PM, 11:00 PM - 7:00 am     pantoprazole  (PROTONIX ) 40 MG tablet Take 40 mg by mouth 2 (two) times daily.     Polyethyl Glycol-Propyl Glycol (SYSTANE) 0.4-0.3 % SOLN Apply 2 drops to eye every 6 (six) hours as needed (dry eyes).     polyethylene glycol powder (GLYCOLAX /MIRALAX ) 17 GM/SCOOP powder Take 17 g by mouth every other day.     sennosides-docusate sodium  (SENOKOT-S) 8.6-50 MG tablet Take 2 tablets by mouth 2 (two) times daily.     Tiotropium Bromide-Olodaterol (STIOLTO RESPIMAT ) 2.5-2.5 MCG/ACT AERS Inhale 2 puffs into the lungs daily.     Vitamin D, Ergocalciferol, (DRISDOL) 1.25 MG (50000 UNIT) CAPS capsule Take 1 capsule (50,000 Units total) by mouth every 7 (seven) days for 6 doses.     zinc oxide 20 % ointment Apply 1 Application topically 2 (two) times daily as needed for irritation.     Current Facility-Administered Medications  Medication Dose Route Frequency Provider Last Rate Last Admin   botulinum toxin Type A  (BOTOX )  injection 200 Units  200 Units Intramuscular Once Onita Duos, MD        PAST MEDICAL HISTORY: Past Medical History:  Diagnosis Date   Anxiety    Arthritis    some in my lower back; probably elbows, knees (11/18/2017)   Atrial fibrillation (HCC)  Bell's palsy    when pt. was 78 yrs old, when under stress the left side of face will droop.   Complication of anesthesia    vascular OR 2016; BP bottomed out; couldn't get it regulated; ended up in ICU for DAYS (11/18/2017)   GERD (gastroesophageal reflux disease)    History of kidney stones    Hypertension    Hypertrophic cardiomyopathy (HCC)    severe LV basilar hypertrophy witn no evidence of significant outflow tract obstruction, EF 65-70%, mild LAE, mild TR, grade 1a diastolic dysfunction 05/15/10 (Dr. Oneil Parchment) (Atrial Septal Hypertrophy pattern)-- Intra-op TEE with dsignificant outflow tract obstruction - AI, MR & TR   Insomnia    Mild aortic sclerosis    Osteopenia    Peripheral vascular disease    Syncope    , Vagal    PAST SURGICAL HISTORY: Past Surgical History:  Procedure Laterality Date   AUGMENTATION MAMMAPLASTY Bilateral    BACK SURGERY     CARDIAC CATHETERIZATION N/A 05/07/2016   Procedure: Left Heart Cath and Coronary Angiography;  Surgeon: Lonni JONETTA Cash, MD;  Location: [facility] INVASIVE CV LAB;  Service: Cardiovascular;  Laterality: N/A;   CARDIOVERSION N/A 09/24/2017   Procedure: CARDIOVERSION;  Surgeon: Rolan Ezra RAMAN, MD;  [facility] ENDOSCOPY;  Service: Cardiovascular;  Laterality: N/A;   DILATION AND CURETTAGE OF UTERUS     ENDARTERECTOMY FEMORAL Right 03/02/2015   Procedure: ENDARTERECTOMY RIGHT FEMORAL;  Surgeon: Lonni RAMAN Blade, MD;  Location: [facility] OR;  Service: Vascular;  Laterality: Right;   ESOPHAGOGASTRODUODENOSCOPY (EGD) WITH PROPOFOL  N/A 12/01/2020   Procedure: ESOPHAGOGASTRODUODENOSCOPY (EGD) WITH PROPOFOL ;  Surgeon: Paola Dreama SAILOR, MD;  Location: [facility] ENDOSCOPY;  Service: General;   Laterality: N/A;   FACIAL COSMETIC SURGERY Left 2002   related to Bell's Palsy @ age 39; left eye/side of face droopy; tried to make area symmetrical   FEMORAL-POPLITEAL BYPASS GRAFT Right 03/02/2015   Procedure: BYPASS GRAFT FEMORAL-BELOW KNEE POPLITEAL ARTERY;  Surgeon: Lonni RAMAN Blade, MD;  Location: [facility] OR;  Service: Vascular;  Laterality: Right;   INGUINAL HERNIA REPAIR Bilateral 2002   IR ANGIO INTRA EXTRACRAN SEL COM CAROTID INNOMINATE UNI L MOD SED  11/16/2020   IR CT HEAD LTD  11/13/2020   IR PERCUTANEOUS ART THROMBECTOMY/INFUSION INTRACRANIAL INC DIAG ANGIO  11/13/2020   OVARIAN CYST REMOVAL Left    PEG PLACEMENT N/A 12/01/2020   Procedure: PERCUTANEOUS ENDOSCOPIC GASTROSTOMY (PEG) PLACEMENT;  Surgeon: Paola Dreama SAILOR, MD;  Location: [facility] ENDOSCOPY;  Service: General;  Laterality: N/A;   PERIPHERAL VASCULAR CATHETERIZATION N/A 01/16/2015   Procedure: Abdominal Aortogram;  Surgeon: Lonni RAMAN Blade, MD;  Location: [facility] INVASIVE CV LAB;  Service: Cardiovascular;  Laterality: N/A;   POSTERIOR LUMBAR FUSION  2015   have plates and screws in there   RADIOLOGY WITH ANESTHESIA N/A 11/13/2020   Procedure: IR WITH ANESTHESIA;  Surgeon: Radiologist, Medication, MD;  Location: [facility] OR;  Service: Radiology;  Laterality: N/A;   TONSILLECTOMY      FAMILY HISTORY: Family History  Problem Relation Age of Onset   Liver cancer Mother    Cancer Mother        Liver   Hypertension Mother    Lung cancer Father    Cancer Father        Lung   Breast cancer Sister    Cancer Sister        Breast    SOCIAL HISTORY: Social History   Socioeconomic History   Marital status: Married  Spouse name: Zell   Number of children: Not on file   Years of education: Not on file   Highest education level: Not on file  Occupational History   Not on file  Tobacco Use   Smoking status: Former    Current packs/day: 0.00    Average packs/day: 1 pack/day for 50.0 years (50.0 ttl pk-yrs)    Types:  Cigarettes    Start date: 12/14/1964    Quit date: 12/15/2014    Years since quitting: 9.2   Smokeless tobacco: Never  Vaping Use   Vaping status: Never Used  Substance and Sexual Activity   Alcohol  use: Yes    Comment: 11/18/2017 might have a couple glasses of wine/month; if that   Drug use: Not on file   Sexual activity: Not Currently  Other Topics Concern   Not on file  Social History Narrative   11/21/21 living at [facility]   Social Drivers of Health   Financial Resource Strain: Low Risk  (08/13/2022)   Overall Financial Resource Strain (CARDIA)    Difficulty of Paying Living Expenses: Not hard at all  Food Insecurity: No Food Insecurity (08/13/2022)   Hunger Vital Sign    Worried About Running Out of Food in the Last Year: Never true    Ran Out of Food in the Last Year: Never true  Transportation Needs: No Transportation Needs (08/13/2022)   PRAPARE - Administrator, Civil Service (Medical): No    Lack of Transportation (Non-Medical): No  Physical Activity: Insufficiently Active (08/13/2022)   Exercise Vital Sign    Days of Exercise per Week: 3 days    Minutes of Exercise per Session: 30 min  Stress: No Stress Concern Present (08/13/2022)   [employer] of Occupational Health - Occupational Stress Questionnaire    Feeling of Stress : Only a little  Social Connections: Moderately Isolated (08/13/2022)   Social Connection and Isolation Panel    Frequency of Communication with Friends and Family: Patient unable to answer    Frequency of Social Gatherings with Friends and Family: More than three times a week    Attends Religious Services: Never    Database Administrator or Organizations: No    Attends Banker Meetings: Never    Marital Status: Married  Catering Manager Violence: Not At Risk (08/13/2022)   Humiliation, Afraid, Rape, and Kick questionnaire    Fear of Current or Ex-Partner: No    Emotionally Abused: No    Physically Abused: No     Sexually Abused: No      Modena Callander, M.D. Ph.D.  [facility] [address] [place], [place] [address] Ph: [phone] Fax: [phone]  CC:  [name], MD [address] [place],  [place] [address]  [name], MD

## 2024-03-03 NOTE — Progress Notes (Signed)
 Botox - 200 units x 1 vial Lot: [id number] Expiration: 2027/26 NDC: 0023-3921-02  Bacteriostatic 0.9% Sodium Chloride - 4 mL  Lot: [id number] Expiration: 02/12/25 NDC: 9590803397  Dx: H18.08, P36.867, I65.21, I65.22  B/B Witnessed by JINNY ROYS RMA

## 2024-03-06 ENCOUNTER — Emergency Department (HOSPITAL_COMMUNITY)
Admission: EM | Admit: 2024-03-06 | Discharge: 2024-03-06 | Disposition: A | Discharge status: Skilled Nursing Facility | Attending: Emergency Medicine | Admitting: Emergency Medicine

## 2024-03-06 ENCOUNTER — Telehealth: Payer: Self-pay | Admitting: Cardiology

## 2024-03-06 ENCOUNTER — Other Ambulatory Visit: Payer: Self-pay

## 2024-03-06 ENCOUNTER — Encounter (HOSPITAL_COMMUNITY): Payer: Self-pay

## 2024-03-06 DIAGNOSIS — Z8673 Personal history of transient ischemic attack (TIA), and cerebral infarction without residual deficits: Secondary | ICD-10-CM | POA: Diagnosis not present

## 2024-03-06 DIAGNOSIS — I10 Essential (primary) hypertension: Secondary | ICD-10-CM | POA: Diagnosis present

## 2024-03-06 DIAGNOSIS — E870 Hyperosmolality and hypernatremia: Secondary | ICD-10-CM | POA: Insufficient documentation

## 2024-03-06 DIAGNOSIS — I159 Secondary hypertension, unspecified: Secondary | ICD-10-CM

## 2024-03-06 LAB — CBC WITH DIFFERENTIAL/PLATELET
Abs Immature Granulocytes: 0.02 K/uL (ref 0.00–0.07)
Basophils Absolute: 0.1 K/uL (ref 0.0–0.1)
Basophils Relative: 1 %
Eosinophils Absolute: 0.3 K/uL (ref 0.0–0.5)
Eosinophils Relative: 4 %
HCT: 40.1 % (ref 36.0–46.0)
Hemoglobin: 12.5 g/dL (ref 12.0–15.0)
Immature Granulocytes: 0 %
Lymphocytes Relative: 26 %
Lymphs Abs: 1.8 K/uL (ref 0.7–4.0)
MCH: 29.8 pg (ref 26.0–34.0)
MCHC: 31.2 g/dL (ref 30.0–36.0)
MCV: 95.5 fL (ref 80.0–100.0)
Monocytes Absolute: 0.5 K/uL (ref 0.1–1.0)
Monocytes Relative: 7 %
Neutro Abs: 4.5 K/uL (ref 1.7–7.7)
Neutrophils Relative %: 62 %
Platelets: 238 K/uL (ref 150–400)
RBC: 4.2 MIL/uL (ref 3.87–5.11)
RDW: 15.1 % (ref 11.5–15.5)
WBC: 7.1 K/uL (ref 4.0–10.5)
nRBC: 0 % (ref 0.0–0.2)

## 2024-03-06 LAB — BASIC METABOLIC PANEL WITH GFR
Anion gap: 14 (ref 5–15)
BUN: 25 mg/dL — ABNORMAL HIGH (ref 8–23)
CO2: 26 mmol/L (ref 22–32)
Calcium: 8.8 mg/dL — ABNORMAL LOW (ref 8.9–10.3)
Chloride: 106 mmol/L (ref 98–111)
Creatinine, Ser: 0.8 mg/dL (ref 0.44–1.00)
GFR, Estimated: >60 mL/min
Glucose, Bld: 96 mg/dL (ref 70–99)
Potassium: 3.6 mmol/L (ref 3.5–5.1)
Sodium: 146 mmol/L — ABNORMAL HIGH (ref 135–145)

## 2024-03-06 MED ORDER — HYDRALAZINE HCL 20 MG/ML IJ SOLN
10.0000 mg | Freq: Once | INTRAMUSCULAR | Status: AC
  Administered 2024-03-06: 10 mg via INTRAVENOUS
  Filled 2024-03-06: qty 1

## 2024-03-06 MED ORDER — HYDRALAZINE HCL 20 MG/ML IJ SOLN
5.0000 mg | Freq: Once | INTRAMUSCULAR | Status: AC
  Administered 2024-03-06: 5 mg via INTRAVENOUS
  Filled 2024-03-06: qty 1

## 2024-03-06 NOTE — ED Provider Notes (Signed)
 Emergency Department Provider Note   I have reviewed the triage vital signs and the nursing notes.   HISTORY  Chief Complaint Hypertension   HPI Beverly Li is a 78 y.o. female past history of hypertension tension, A-fib, stroke presents to the emergency department from [facility] with elevated blood pressure.  She is on chronic O2.  She reports feeling generally unwell but denies any severe headache, chest pain, numbness/weakness beyond her baseline right sided weakness.  She is compliant with her home blood pressure medications.  She states yesterday she actually had some low blood pressures but none of her medications were held today because of this.  She does have to alternate between hydralazine  for high blood pressure if her systolics get greater than 160 or midodrine  if they drop into the 80s.  She had 2 extra doses of her hydralazine  today at her facility with continued high blood pressures and so was transported to the emergency department.   Past Medical History:  Diagnosis Date   Anxiety    Arthritis    some in my lower back; probably elbows, knees (11/18/2017)   Atrial fibrillation (HCC)    Bell's palsy    when pt. was 78 yrs old, when under stress the left side of face will droop.   Complication of anesthesia    vascular OR 2016; BP bottomed out; couldn't get it regulated; ended up in ICU for DAYS (11/18/2017)   GERD (gastroesophageal reflux disease)    History of kidney stones    Hypertension    Hypertrophic cardiomyopathy (HCC)    severe LV basilar hypertrophy witn no evidence of significant outflow tract obstruction, EF 65-70%, mild LAE, mild TR, grade 1a diastolic dysfunction 05/15/10 (Dr. Oneil Parchment) (Atrial Septal Hypertrophy pattern)-- Intra-op TEE with dsignificant outflow tract obstruction - AI, MR & TR   Insomnia    Mild aortic sclerosis    Osteopenia    Peripheral vascular disease    Syncope    , Vagal    Review of Systems  Constitutional:  No fever/chills. Patient feeling generally unwell.  Eyes: No visual changes. ENT: No sore throat. Cardiovascular: Denies chest pain. Respiratory: Denies shortness of breath. Gastrointestinal: No abdominal pain.  No nausea, no vomiting.  Genitourinary: Negative for dysuria. Musculoskeletal: Negative for back pain. Skin: Negative for rash. Neurological: Negative for headaches.   ____________________________________________   PHYSICAL EXAM:  VITAL SIGNS: ED Triage Vitals  Encounter Vitals Group     BP 03/06/24 0104 (!) 187/77     Pulse Rate 03/06/24 0104 61     Resp 03/06/24 0104 13     Temp --      Temp src --      SpO2 03/06/24 0102 100 %     Weight 03/06/24 0105 94 lb (42.6 kg)     Height 03/06/24 0105 5' 5 (1.651 m)    Constitutional: Alert and oriented. Well appearing and in no acute distress. Eyes: Conjunctivae are normal.  Head: Atraumatic. Nose: No congestion/rhinnorhea. Mouth/Throat: Mucous membranes are moist.   Neck: No stridor.  Cardiovascular: Normal rate, regular rhythm. Good peripheral circulation. Grossly normal heart sounds.   Respiratory: Normal respiratory effort.  No retractions. Lungs CTAB. Gastrointestinal: Soft and nontender. No distention.  Musculoskeletal: No gross deformities of extremities. Neurologic:  Mild dysarthria noted. Weakness in the RUE and RLE (baseline).  Skin:  Skin is warm, dry and intact. No rash noted.   ____________________________________________   LABS (all labs ordered are listed, but only  abnormal results are displayed)  Labs Reviewed  BASIC METABOLIC PANEL WITH GFR - Abnormal; Notable for the following components:      Result Value   Sodium 146 (*)    BUN 25 (*)    Calcium  8.8 (*)    All other components within normal limits  CBC WITH DIFFERENTIAL/PLATELET   ____________________________________________  EKG  No STEMI  ____________________________________________   PROCEDURES  Procedure(s) performed:    Procedures  CRITICAL CARE Performed by: Fonda KANDICE Law Total critical care time: 35 minutes Critical care time was exclusive of separately billable procedures and treating other patients. Critical care was necessary to treat or prevent imminent or life-threatening deterioration. Critical care was time spent personally by me on the following activities: development of treatment plan with patient and/or surrogate as well as nursing, discussions with consultants, evaluation of patient's response to treatment, examination of patient, obtaining history from patient or surrogate, ordering and performing treatments and interventions, ordering and review of laboratory studies, ordering and review of radiographic studies, pulse oximetry and re-evaluation of patient's condition.  Fonda Law, MD Emergency Medicine  ____________________________________________   INITIAL IMPRESSION / ASSESSMENT AND PLAN / ED COURSE  Pertinent labs & imaging results that were available during my care of the patient were reviewed by me and considered in my medical decision making (see chart for details).   This patient is Presenting for Evaluation of HTN, which does require a range of treatment options, and is a complaint that involves a high risk of morbidity and mortality.  The Differential Diagnoses include asymptomatic HTN, HTN emergency, ACS, AKI, etc.  Critical Interventions-    Medications  hydrALAZINE  (APRESOLINE ) injection 5 mg (5 mg Intravenous Given 03/06/24 0307)  hydrALAZINE  (APRESOLINE ) injection 10 mg (10 mg Intravenous Given 03/06/24 0353)    Reassessment after intervention:  BP improved.    I did obtain Additional Historical Information from family at bedside.    Clinical Laboratory Tests Ordered, included mild hypernatremia to 146.  No acute kidney injury.  CBC without leukocytosis or anemia.  Cardiac Monitor Tracing which shows NSR.    Social Determinants of Health Risk patient is not  an active smoker.   Medical Decision Making: Summary:  Patient presents to the emergency department for evaluation of elevated blood pressure.  Blood pressures are labile at baseline.  No new deficits appreciated on exam.  Plan for screening blood work and follow blood pressure and EKG without acute management at this time.   Reevaluation with update and discussion with patient.  BP improved with additional hydralazine  given here.  No evidence of endorgan damage either on exam or labs.  Stable for discharge.  Advised no immediate changes to her blood pressure regimen as she tends to run both high and low at times.  Discussed that I would defer this to her PCP and she will make them aware of her ED visit and symptoms.  Considered admission but no evidence of acute HTN emergency.   Patient's presentation is most consistent with acute presentation with potential threat to life or bodily function.   Disposition: discharge  ____________________________________________  FINAL CLINICAL IMPRESSION(S) / ED DIAGNOSES  Final diagnoses:  Hypertension, unspecified type    Note:  This document was prepared using Dragon voice recognition software and may include unintentional dictation errors.  Fonda Law, MD, [facility] Emergency Medicine    [place], Fonda KANDICE, MD 03/07/24 [phone]

## 2024-03-06 NOTE — ED Triage Notes (Signed)
 Pt arriving from [facility] facility for evaluation of HTN of 202/88 per EMS; Pt has previous right sided deficits from a stroke and wears 2lpm [place] per baseline. Pt reports fatigue but no other symptoms upon arrival. A+O x4

## 2024-03-06 NOTE — Discharge Instructions (Signed)
As we discussed, though you do have high blood pressure (hypertension), fortunately it is not immediately dangerous at this time and does not need emergency intervention or admission to the hospital.  If we add to or change your regular medications, we may cause more harm than good - it is more appropriate for your primary care doctor to evaluate you in clinic and decide if any medication changes are needed.  Please follow up in clinic as recommended in these papers.   ° °Return to the Emergency Department (ED) if you experience any worsening chest pain/pressure/tightness, difficulty breathing, or sudden sweating, or other symptoms that concern you. °

## 2024-03-06 NOTE — Telephone Encounter (Signed)
 RN from [facility] called reporting patient was seen last evening in the ED for elevated BP. On call PCP at [facility] recommended to increase amlodipine  from 2.5 to 5mg  daily and hydralazine  to 20mg  TID as BP remains elevated at 158/74. Patient and husband wanted to review with cardiology prior making these changes. I advised ok to increase meds and follow up BP trends. Update us  if there are issues. RN voiced understanding and thanked me for callback.

## 2024-03-08 ENCOUNTER — Telehealth: Payer: Self-pay | Admitting: Cardiology

## 2024-03-08 NOTE — Telephone Encounter (Signed)
 Nurse from [facility] called in asking if Dr. Jeffrie had a chance to review notes and EKG they sent over. Scanned in Media tab 11/20. I did tell her it has a bunch of black lines through it so not sure if EKG is legible but will forward over to him to let him know. She states she will fax it over again if need be.

## 2024-03-08 NOTE — Telephone Encounter (Signed)
 Spoke to Dr Shlomo to review information .  Per Dr Shlomo, she reviewed  EKG's that were sent  Verification of date of EKG was taken by RN  Per Jenkins Jansky ,at [facility] she states the EKG was done 03/02/24-     Dr Shlomo would like for patient to see Afib Clinic tomorrow if possible.   Appointment not available ,schedule for 03/10/24 instead

## 2024-03-08 NOTE — Telephone Encounter (Signed)
 Spoke  Beverly Li at [facility]  Informed  Nurse   no changes with medication ,  patient has an appointment with  Afib clinic  on 11/26 25 at 9:30 am . Direction and address given patient will need to be at the appointment by 9 am.   [name] voiced  understanding, she states she will let [name]'s Husband know

## 2024-03-08 NOTE — Telephone Encounter (Signed)
 Called [facility] - Misty   States the practitioner there at [facility] - would like a provider to review  patient's  recent EKG  and blood pressure reading.  RN informed   Misty  the  information in question  has not uploaded to The fax machine , RN reviewed.  Misty states she will refax. She has the correct Fax  number.

## 2024-03-10 ENCOUNTER — Encounter (HOSPITAL_COMMUNITY): Payer: Self-pay | Admitting: Internal Medicine

## 2024-03-10 ENCOUNTER — Ambulatory Visit (HOSPITAL_COMMUNITY)
Admission: RE | Admit: 2024-03-10 | Discharge: 2024-03-10 | Disposition: A | Discharge status: Home / Self Care | Source: Ambulatory Visit | Attending: Internal Medicine | Admitting: Internal Medicine

## 2024-03-10 VITALS — BP 128/60 | HR 55 | Ht 65.0 in | Wt 93.9 lb

## 2024-03-10 DIAGNOSIS — I480 Paroxysmal atrial fibrillation: Secondary | ICD-10-CM | POA: Diagnosis not present

## 2024-03-10 DIAGNOSIS — Z5181 Encounter for therapeutic drug level monitoring: Secondary | ICD-10-CM

## 2024-03-10 NOTE — Progress Notes (Addendum)
 Primary Care Physician: [name], MD Primary Cardiologist: Oneil Parchment, MD Electrophysiologist: Elspeth Sage, MD (Inactive)     Referring Physician: Dr. Shlomo Cathlean KATHEE Beverly Li is a 78 y.o. female with a history of labile hypertension, hyperlipidemia, HOCM, CAD, CVA, and atrial fibrillation who presents for consultation in the [facility].  ED visit on 03/06/2024 for hypertension.  PCP at [facility] facility increased amlodipine  to 5 mg daily and hydralazine  to 20 mg 3 times daily.  Faxed ECG appears to show A-fib.  Patient is taking amiodarone  100 mg daily.  Patient is on Eliquis  for stroke prevention.  On evaluation today, patient is currently in junctional rhythm.  Since amlodipine  was increased, patient has noticed blood pressure readings have now been normal.  She has not needed to take hydralazine  yet.  No missed doses of Eliquis .  Today, she denies symptoms of palpitations, chest pain, shortness of breath, orthopnea, PND, lower extremity edema, dizziness, presyncope, syncope, bleeding, or neurologic sequela. The patient is tolerating medications without difficulties and is otherwise without complaint today.   she has a BMI of Body mass index is 15.63 kg/m.SABRA Filed Weights   03/10/24 0928  Weight: 42.6 kg    Current Outpatient Medications  Medication Sig Dispense Refill   acetaminophen  (TYLENOL ) 325 MG tablet Take 650 mg by mouth every 4 (four) hours as needed.     albuterol  (VENTOLIN  HFA) 108 (90 Base) MCG/ACT inhaler Inhale 2 puffs into the lungs every 4 (four) hours as needed for wheezing or shortness of breath.     amiodarone  (PACERONE ) 100 MG tablet Take 100 mg by mouth daily.     amLODipine  (NORVASC ) 2.5 MG tablet Take 2.5 mg by mouth daily.     apixaban  (ELIQUIS ) 5 MG TABS tablet Take 5 mg by mouth 2 (two) times daily. May give crushed     Benzonatate  (TESSALON  PERLES PO) Take 200 mg by mouth as needed. 200mg , oral as needed,  Tessalon  Perles, 200mg , PO TID PRN cough     bisacodyl  (DULCOLAX) 5 MG EC tablet Take 5 mg by mouth 2 (two) times daily as needed for moderate constipation.     buPROPion  (WELLBUTRIN  XL) 150 MG 24 hr tablet Take 150 mg by mouth daily.     Calcium -Vitamin D -Vitamin K (VIACTIV CALCIUM  PLUS D) 650-12.5-40 MG-MCG-MCG CHEW Chew 1 tablet by mouth daily.     Cholecalciferol (VITAMIN D3) 20 MCG (800 UNIT) TABS Take 1 tablet by mouth daily.     denosumab (PROLIA) 60 MG/ML SOSY injection Inject 60 mg into the skin every 6 (six) months.     diclofenac Sodium (VOLTAREN) 1 % GEL Apply 4 g topically every 6 (six) hours as needed. Apply one application to right hip and knee topically.     Emollient (CERAVE) CREA Apply 1 Application topically as needed.     Eyelid Cleansers (OCUSOFT EYELID CLEANSING) PADS Apply 1 Application topically daily.     ezetimibe  (ZETIA ) 10 MG tablet Take 10 mg by mouth daily. Oral if crushed     furosemide  (LASIX ) 20 MG tablet Take 20 mg by mouth daily as needed. Give Lasix  20mg  for weight gain equal or greater than 3 pounds     hydrALAZINE  (APRESOLINE ) 10 MG tablet Take 10 mg by mouth 3 (three) times daily as needed (SBP > 165 or DBP > 95).     hydrocortisone cream 1 % Apply 1 Application topically 2 (two) times daily as needed for  itching.     ipratropium-albuterol  (DUONEB) 0.5-2.5 (3) MG/3ML SOLN Inhale 3 mLs into the lungs every 6 (six) hours as needed.     ketoconazole  (NIZORAL ) 2 % cream Apply 1 application  topically every 12 (twelve) hours as needed for irritation.     lidocaine  (LIDODERM ) 5 % Place 1 patch onto the skin daily. Remove & Discard patch within 12 hours or as directed by MD     methocarbamol  (ROBAXIN ) 500 MG tablet Take 250 mg by mouth every 6 (six) hours as needed.     ondansetron  (ZOFRAN ) 4 MG tablet Take 4 mg by mouth every 6 (six) hours as needed for nausea or vomiting.     OXYGEN  Inhale 3 L into the lungs as directed. to maintain sats > or equal to 90%. May  titrate Three Times A Day; 07:00 AM - 03:00 PM, 03:00 PM - 11:00 PM, 11:00 PM - 7:00 am     pantoprazole  (PROTONIX ) 40 MG tablet Take 40 mg by mouth 2 (two) times daily.     Polyethyl Glycol-Propyl Glycol (SYSTANE) 0.4-0.3 % SOLN Apply 2 drops to eye every 6 (six) hours as needed (dry eyes).     polyethylene glycol powder (GLYCOLAX /MIRALAX ) 17 GM/SCOOP powder Take 17 g by mouth every other day.     sennosides-docusate sodium  (SENOKOT-S) 8.6-50 MG tablet Take 2 tablets by mouth 2 (two) times daily.     Tiotropium Bromide-Olodaterol (STIOLTO RESPIMAT ) 2.5-2.5 MCG/ACT AERS Inhale 2 puffs into the lungs daily.     Vitamin D , Ergocalciferol , (DRISDOL ) 1.25 MG (50000 UNIT) CAPS capsule Take 1 capsule (50,000 Units total) by mouth every 7 (seven) days for 6 doses.     zinc oxide 20 % ointment Apply 1 Application topically 2 (two) times daily as needed for irritation.     No current facility-administered medications for this encounter.    Atrial Fibrillation Management history:  Previous antiarrhythmic drugs: Tikosyn , amiodarone  Previous cardioversions: None Previous ablations: None Anticoagulation history: Eliquis    ROS- All systems are reviewed and negative except as per the HPI above.  Physical Exam: BP 128/60   Pulse (!) 55   Ht 5' 5 (1.651 m)   Wt 42.6 kg   LMP  (LMP Unknown)   BMI 15.63 kg/m   GEN: Well nourished, well developed in no acute distress NECK: No JVD; No carotid bruits CARDIAC: Regular rate and rhythm, no murmurs, rubs, gallops RESPIRATORY:  Clear to auscultation without rales, wheezing or rhonchi  ABDOMEN: Soft, non-tender, non-distended EXTREMITIES:  No edema; No deformity   EKG today demonstrates  EKG Interpretation Date/Time:  Wednesday March 10 2024 09:32:08 EST Ventricular Rate:  55 PR Interval:    QRS Duration:  110 QT Interval:  486 QTC Calculation: 464 R Axis:   49  Text Interpretation: Junctional rhythm with retrograde conduction Minimal  voltage criteria for LVH, may be normal variant ( Cornell product ) Nonspecific ST abnormality Abnormal ECG When compared with ECG of 15-May-2023 15:58, PREVIOUS ECG IS PRESENT Confirmed by Terra Pac ([id number]) on 03/10/2024 9:49:13 AM    Echo 07/08/2021 demonstrated  1. Left ventricular ejection fraction, by estimation, is 60 to 65%. Left  ventricular ejection fraction by 3D volume is 65 %. The left ventricle has  normal function. The left ventricle has no regional wall motion  abnormalities. There is moderate  asymmetric left ventricular hypertrophy of the basal-septal segment. Left  ventricular diastolic parameters are consistent with Grade II diastolic  dysfunction (pseudonormalization). Elevated left atrial pressure.  2. Right ventricular systolic function is normal. The right ventricular  size is normal. There is normal pulmonary artery systolic pressure. The  estimated right ventricular systolic pressure is 21.8 mmHg.   3. Left atrial size was moderately dilated.   4. The mitral valve is degenerative. Trivial mitral valve regurgitation.  No evidence of mitral stenosis. Moderate mitral annular calcification.   5. The aortic valve is tricuspid. Aortic valve regurgitation is moderate.  Aortic valve sclerosis/calcification is present, without any evidence of  aortic stenosis.   6. Pulmonic valve regurgitation is mild to moderate.   7. The inferior vena cava is normal in size with greater than 50%  respiratory variability, suggesting right atrial pressure of 3 mmHg.   ASSESSMENT & PLAN CHA2DS2-VASc Score = 7  The patient's score is based upon: CHF History: 0 HTN History: 1 Diabetes History: 0 Stroke History: 2 Vascular Disease History: 1 Age Score: 2 Gender Score: 1       ASSESSMENT AND PLAN: Paroxysmal Atrial Fibrillation (ICD10:  I48.0) The patient's CHA2DS2-VASc score is 7, indicating a 11.2% annual risk of stroke.    The patient is currently in junctional rhythm.  She  is asymptomatic and overall feels well.  At this time, we will continue with conservative observation.  If patient has increased A-fib burden, we will discuss with Dr. Jeffrie her primary cardiologist but can consider increasing amiodarone  from 100 mg to 200 mg daily.  Secondary Hypercoagulable State (ICD10:  D68.69) The patient is at significant risk for stroke/thromboembolism based upon her CHA2DS2-VASc Score of 7.  Continue Apixaban  (Eliquis ).  Continue Eliquis  5 mg twice daily.  Dosing is correct based on age below 61 years old and creatinine less than 1.5.  Hypertension Stable today.   Follow up A-fib clinic as needed.   Terra Pac, [facility]  Afib Clinic [address] [place], [place] [address] [phone]

## 2024-03-15 ENCOUNTER — Non-Acute Institutional Stay (SKILLED_NURSING_FACILITY): Admitting: Internal Medicine

## 2024-03-15 ENCOUNTER — Encounter: Payer: Self-pay | Admitting: Internal Medicine

## 2024-03-15 DIAGNOSIS — I480 Paroxysmal atrial fibrillation: Secondary | ICD-10-CM | POA: Diagnosis not present

## 2024-03-15 DIAGNOSIS — I10 Essential (primary) hypertension: Secondary | ICD-10-CM | POA: Diagnosis not present

## 2024-03-15 DIAGNOSIS — G8191 Hemiplegia, unspecified affecting right dominant side: Secondary | ICD-10-CM | POA: Diagnosis not present

## 2024-03-15 MED ORDER — HYDRALAZINE HCL 10 MG PO TABS: 20.0000 mg | ORAL_TABLET | Freq: Three times a day (TID) | ORAL | Status: DC

## 2024-03-15 NOTE — Progress Notes (Signed)
 Location:  [facility] Room Number: [name] 3 [id number] Place of Service:  SNF [phone]) Provider:  [name], MD  Patient Care Team: [name], MD as PCP - General (Internal Medicine) Jeffrie Oneil BROCKS, MD as PCP - Cardiology (Cardiology) [name], MD (Inactive) as PCP - Electrophysiology (Cardiology) Alix Charleston, MD as Consulting Physician (Neurosurgery)  Extended Emergency Contact Information Primary Emergency Contact: Gatchalian,[name] Address: [address]          [place] [phone] [place] Home Phone: [phone] Mobile Phone: [phone] Relation: Spouse Secondary Emergency Contact: Cabacungan,[name] Address: [address]          [place], [place] [address] [place] Mobile Phone: [phone] Relation: Son  Code Status:  Full code Goals of care: Advanced Directive information    03/15/2024   10:13 AM  Advanced Directives  Does Patient Have a Medical Advance Directive? Yes  Type of Advance Directive Living will  Does patient want to make changes to medical advance directive? No - Patient declined     Chief Complaint  Patient presents with   Acute Visit    HPI:  Pt is a 78 y.o. 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 Noises  in her head when her BP was high ? Chest pressure she denies it now No SOB  Patient husband also wanted me to know  that she has been eating sausage and biscuits from [employer] more then 2-3 times a week recently  Patient denies any stressors or pain She was at her baseline this morning with normal BP She has Gained weight also but did not have any edema She is not very ambulatory Except walking with therapy  She was seen in A Fib clinic and 03/10/2024 Her BP was good Her EKG showed Junctional Rhythm    Past Medical History:  Diagnosis Date   Anxiety    Arthritis    some in my lower back; probably elbows, knees (11/18/2017)   Atrial fibrillation (HCC)    Bell's palsy    when pt. was 78 yrs old, when under stress the left side of face will droop.   Complication of anesthesia    vascular OR 2016; BP bottomed out; couldn't get it regulated; ended up in ICU for DAYS (11/18/2017)   GERD (gastroesophageal reflux disease)    History of kidney stones    Hypertension    Hypertrophic cardiomyopathy (HCC)    severe LV basilar hypertrophy witn no evidence of significant outflow tract obstruction, EF 65-70%, mild LAE, mild TR, grade 1a diastolic dysfunction 05/15/10 (Dr. Oneil Jeffrie) (Atrial Septal Hypertrophy pattern)-- Intra-op TEE with dsignificant outflow tract obstruction - AI, MR & TR   Insomnia    Mild aortic sclerosis    Osteopenia    Peripheral vascular disease  Syncope    , Vagal   Past Surgical History:  Procedure Laterality Date   AUGMENTATION MAMMAPLASTY Bilateral    BACK SURGERY     CARDIAC CATHETERIZATION N/A 05/07/2016   Procedure: Left Heart Cath and Coronary Angiography;  Surgeon: Lonni JONETTA Cash, MD;  Location: [facility] INVASIVE CV LAB;  Service: Cardiovascular;  Laterality: N/A;   CARDIOVERSION N/A 09/24/2017   Procedure: CARDIOVERSION;  Surgeon: Rolan Ezra RAMAN, MD;  Location: [facility] ENDOSCOPY;  Service: Cardiovascular;  Laterality: N/A;   DILATION AND CURETTAGE OF UTERUS     ENDARTERECTOMY FEMORAL Right 03/02/2015   Procedure: ENDARTERECTOMY RIGHT FEMORAL;  Surgeon:  Lonni RAMAN Blade, MD;  Location: [facility] OR;  Service: Vascular;  Laterality: Right;   ESOPHAGOGASTRODUODENOSCOPY (EGD) WITH PROPOFOL  N/A 12/01/2020   Procedure: ESOPHAGOGASTRODUODENOSCOPY (EGD) WITH PROPOFOL ;  Surgeon: Paola Dreama SAILOR, MD;  Location: [facility] ENDOSCOPY;  Service: General;  Laterality: N/A;   FACIAL COSMETIC SURGERY Left 2002   related to Bell's Palsy @ age 68; left eye/side of face droopy; tried to make area symmetrical   FEMORAL-POPLITEAL BYPASS GRAFT Right 03/02/2015   Procedure: BYPASS GRAFT FEMORAL-BELOW KNEE POPLITEAL ARTERY;  Surgeon: Lonni RAMAN Blade, MD;  Location: [facility] OR;  Service: Vascular;  Laterality: Right;   INGUINAL HERNIA REPAIR Bilateral 2002   IR ANGIO INTRA EXTRACRAN SEL COM CAROTID INNOMINATE UNI L MOD SED  11/16/2020   IR CT HEAD LTD  11/13/2020   IR PERCUTANEOUS ART THROMBECTOMY/INFUSION INTRACRANIAL INC DIAG ANGIO  11/13/2020   OVARIAN CYST REMOVAL Left    PEG PLACEMENT N/A 12/01/2020   Procedure: PERCUTANEOUS ENDOSCOPIC GASTROSTOMY (PEG) PLACEMENT;  Surgeon: Paola Dreama SAILOR, MD;  Location: [facility] ENDOSCOPY;  Service: General;  Laterality: N/A;   PERIPHERAL VASCULAR CATHETERIZATION N/A 01/16/2015   Procedure: Abdominal Aortogram;  Surgeon: Lonni RAMAN Blade, MD;  Location: [facility] INVASIVE CV LAB;  Service: Cardiovascular;  Laterality: N/A;   POSTERIOR LUMBAR FUSION  2015   have plates and screws in there   RADIOLOGY WITH ANESTHESIA N/A 11/13/2020   Procedure: IR WITH ANESTHESIA;  Surgeon: Radiologist, Medication, MD;  Location: [facility] OR;  Service: Radiology;  Laterality: N/A;   TONSILLECTOMY      Allergies  Allergen Reactions   Amoxicillin Other (See Comments)    UTI Has patient had a PCN reaction causing immediate rash, facial/tongue/throat swelling, SOB or lightheadedness with hypotension: No Has patient had a PCN reaction causing severe rash involving mucus membranes or skin necrosis: No Has patient had a PCN reaction that required hospitalization: No Has  patient had a PCN reaction occurring within the last 10 years: Yes--UTI ONLY If all of the above answers are NO, then may proceed with Cephalosporin use.    Atenolol Cough   Crestor  [Rosuvastatin  Calcium ] Other (See Comments)    Muscle aches   Pravastatin  Other (See Comments)    Muscle aches   Rosuvastatin  Calcium     Sulfa Antibiotics Nausea Only   Codeine Other (See Comments)    hallucinations    Outpatient Encounter Medications as of 03/15/2024  Medication Sig   acetaminophen  (TYLENOL ) 325 MG tablet Take 650 mg by mouth every 4 (four) hours as needed.   albuterol  (VENTOLIN  HFA) 108 (90 Base) MCG/ACT inhaler Inhale 2 puffs into the lungs every 4 (four) hours as needed for wheezing or shortness of breath.   amiodarone  (PACERONE ) 100 MG tablet Take 100 mg by mouth daily.   amLODipine  (NORVASC ) 2.5 MG tablet Take 2.5 mg by mouth daily.   apixaban  (ELIQUIS ) 5 MG TABS  tablet Take 5 mg by mouth 2 (two) times daily. May give crushed   Benzonatate  (TESSALON  PERLES PO) Take 200 mg by mouth as needed. 200mg , oral as needed, Tessalon  Perles, 200mg , PO TID PRN cough   buPROPion  (WELLBUTRIN  XL) 150 MG 24 hr tablet Take 150 mg by mouth daily.   Calcium -Vitamin D -Vitamin K (VIACTIV CALCIUM  PLUS D) 650-12.5-40 MG-MCG-MCG CHEW Chew 1 tablet by mouth daily.   Cholecalciferol (VITAMIN D3) 20 MCG (800 UNIT) TABS Take 1 tablet by mouth daily.   denosumab (PROLIA) 60 MG/ML SOSY injection Inject 60 mg into the skin every 6 (six) months.   diclofenac Sodium (VOLTAREN) 1 % GEL Apply 4 g topically every 6 (six) hours as needed. Apply one application to right hip and knee topically.   Emollient (CERAVE) CREA Apply 1 Application topically as needed.   Eyelid Cleansers (OCUSOFT EYELID CLEANSING) PADS Apply 1 Application topically daily.   ezetimibe  (ZETIA ) 10 MG tablet Take 10 mg by mouth daily. Oral if crushed   furosemide  (LASIX ) 20 MG tablet Take 20 mg by mouth daily as needed. Give Lasix  20mg  for weight gain  equal or greater than 3 pounds   hydrALAZINE  (APRESOLINE ) 10 MG tablet Take 10 mg by mouth 3 (three) times daily as needed (SBP > 165 or DBP > 95).   hydrocortisone cream 1 % Apply 1 Application topically 2 (two) times daily as needed for itching.   ipratropium-albuterol  (DUONEB) 0.5-2.5 (3) MG/3ML SOLN Inhale 3 mLs into the lungs every 6 (six) hours as needed.   ketoconazole  (NIZORAL ) 2 % cream Apply 1 application  topically every 12 (twelve) hours as needed for irritation.   ondansetron  (ZOFRAN ) 4 MG tablet Take 4 mg by mouth every 6 (six) hours as needed for nausea or vomiting.   OXYGEN  Inhale 3 L into the lungs as directed. to maintain sats > or equal to 90%. May titrate Three Times A Day; 07:00 AM - 03:00 PM, 03:00 PM - 11:00 PM, 11:00 PM - 7:00 am   pantoprazole  (PROTONIX ) 40 MG tablet Take 40 mg by mouth 2 (two) times daily.   Polyethyl Glycol-Propyl Glycol (SYSTANE) 0.4-0.3 % SOLN Apply 2 drops to eye every 6 (six) hours as needed (dry eyes).   polyethylene glycol powder (GLYCOLAX /MIRALAX ) 17 GM/SCOOP powder Take 17 g by mouth every other day.   sennosides-docusate sodium  (SENOKOT-S) 8.6-50 MG tablet Take 2 tablets by mouth 2 (two) times daily.   Tiotropium Bromide-Olodaterol (STIOLTO RESPIMAT ) 2.5-2.5 MCG/ACT AERS Inhale 2 puffs into the lungs daily.   Vitamin D , Ergocalciferol , (DRISDOL ) 1.25 MG (50000 UNIT) CAPS capsule Take 1 capsule (50,000 Units total) by mouth every 7 (seven) days for 6 doses.   zinc oxide 20 % ointment Apply 1 Application topically 2 (two) times daily as needed for irritation.   bisacodyl  (DULCOLAX) 5 MG EC tablet Take 5 mg by mouth 2 (two) times daily as needed for moderate constipation. (Patient not taking: Reported on 03/15/2024)   lidocaine  (LIDODERM ) 5 % Place 1 patch onto the skin daily. Remove & Discard patch within 12 hours or as directed by MD (Patient not taking: Reported on 03/15/2024)   methocarbamol  (ROBAXIN ) 500 MG tablet Take 250 mg by mouth every 6  (six) hours as needed. (Patient not taking: Reported on 03/15/2024)   No facility-administered encounter medications on file as of 03/15/2024.    Review of Systems  Constitutional:  Negative for activity change and appetite change.  HENT: Negative.    Respiratory:  Negative for  cough and shortness of breath.   Cardiovascular:  Negative for leg swelling.  Gastrointestinal:  Negative for constipation.  Genitourinary: Negative.   Musculoskeletal:  Positive for gait problem. Negative for arthralgias and myalgias.  Skin: Negative.   Neurological:  Negative for dizziness and weakness.  Psychiatric/Behavioral:  Negative for confusion, dysphoric mood and sleep disturbance.     Immunization History  Administered Date(s) Administered   Fluad Quad(high Dose 65+) 02/05/2022, 01/08/2023   Fluzone Influenza virus vaccine,trivalent (IIV3), split virus 04/30/2010, 01/26/2016, 02/07/2018, 02/01/2020   INFLUENZA, HIGH DOSE SEASONAL PF 02/07/2018, 01/25/2019, 02/28/2021, 02/05/2022, 01/13/2024   Influenza Split 01/16/2009   Influenza,inj,Quad PF,6+ Mos 03/06/2012, 01/26/2016, 01/10/2017   Influenza,inj,quad, With Preservative 02/14/2015   Moderna Covid-19 Fall Seasonal Vaccine 79yrs & older 01/13/2024   Moderna Covid-19 Vaccine Bivalent Booster 2yrs & up 02/28/2021, 02/04/2023   PFIZER(Purple Top)SARS-COV-2 Vaccination 05/06/2019, 05/27/2019, 01/27/2020, 09/18/2020   Pneumococcal Conjugate-13 03/19/2017   Pneumococcal Polysaccharide-23 02/14/2015   Respiratory Syncytial Virus Vaccine,Recomb Aduvanted(Arexvy) 02/15/2022   Td 02/25/2005   Tdap 05/19/2013   Zoster, Live 07/28/2006   Pertinent  Health Maintenance Due  Topic Date Due   Influenza Vaccine  Completed   Mammogram  Discontinued   Bone Density Scan  Discontinued      11/26/2022   12:34 PM 12/31/2022    3:26 PM 03/11/2023    2:48 PM 12/16/2023    3:28 PM 03/02/2024    3:02 PM  Fall Risk  Falls in the past year? 0 0 1 0 0  Was there an  injury with Fall? 0 0 0 0 0  Fall Risk Category Calculator 0 0 1 0 0  Patient at Risk for Falls Due to History of fall(s);Impaired balance/gait History of fall(s);Impaired balance/gait;Impaired mobility History of fall(s);Impaired balance/gait;Impaired mobility History of fall(s);Impaired balance/gait;Impaired mobility Impaired balance/gait  Fall risk Follow up Falls evaluation completed;Education provided Falls evaluation completed;Education provided;Falls prevention discussed Falls evaluation completed;Education provided;Falls prevention discussed Falls evaluation completed Falls evaluation completed   Functional Status Survey:    Vitals:   03/15/24 0924  BP: 112/68  Pulse: 64  Resp: 18  Temp: (!) 97.2 F (36.2 C)  SpO2: 96%  Weight: 94 lb 12.8 oz (43 kg)  Height: 5' 5 (1.651 m)   Body mass index is 15.78 kg/m. Physical Exam Vitals reviewed.  Constitutional:      Appearance: Normal appearance.  HENT:     Head: Normocephalic.     Nose: Nose normal.     Mouth/Throat:     Mouth: Mucous membranes are moist.     Pharynx: Oropharynx is clear.  Eyes:     Pupils: Pupils are equal, round, and reactive to light.  Cardiovascular:     Rate and Rhythm: Regular rhythm. Bradycardia present.     Pulses: Normal pulses.     Heart sounds: Normal heart sounds. No murmur heard. Pulmonary:     Effort: Pulmonary effort is normal.     Breath sounds: Normal breath sounds.  Abdominal:     General: Abdomen is flat. Bowel sounds are normal.     Palpations: Abdomen is soft.  Musculoskeletal:        General: No swelling.     Cervical back: Neck supple.  Skin:    General: Skin is warm.  Neurological:     Mental Status: She is alert and oriented to person, place, and time.  Psychiatric:        Mood and Affect: Mood normal.  Thought Content: Thought content normal.     Labs reviewed: Recent Labs    10/15/23 0000 02/18/24 0900 03/06/24 0134  NA 141 143 146*  K 4.9 4.5 3.6  CL  105 104 106  CO2 25* 24* 26  GLUCOSE  --   --  96  BUN 34* 39* 25*  CREATININE 0.9 1.0 0.80  CALCIUM  9.2 8.9 8.8*   Recent Labs    05/21/23 0000 06/24/23 0000 02/18/24 0900  AST 30 30 22   ALT 35 30 17  ALKPHOS 93 95 64  ALBUMIN  3.4* 3.0* 3.7   Recent Labs    10/15/23 0000 02/18/24 0900 03/06/24 0134  WBC 6.7 7.1 7.1  NEUTROABS  --  4.30 4.5  HGB 13.3 13.5 12.5  HCT 41 42 40.1  MCV  --   --  95.5  PLT 221 234 238   Lab Results  Component Value Date   TSH 0.76 02/18/2024   Lab Results  Component Value Date   HGBA1C 6.0 (H) 11/13/2020   Lab Results  Component Value Date   CHOL 182 02/18/2024   HDL 78 (A) 02/18/2024   LDLCALC 85 02/18/2024   TRIG 96 02/18/2024   CHOLHDL 3.0 11/13/2020    Significant Diagnostic Results in last 30 days:  No results found.  Assessment/Plan 1. Essential hypertension (Primary) Will Add Norvasc  2.5 mg at night  Also will Do Clonidine 0.1 mg every day Prn for SBP more then 170 Discussed with patient to cut back on her Sausage biscuit NAS diet  2. PAF (paroxysmal atrial fibrillation) (HCC) On Amiodarone  NO change in the dose unless goes in A Fib She is on Eliquis   3. Right hemiplegia (HCC)     Family/ staff Communication:  Total time spent in this patient care encounter was  45_  minutes; greater than 50% of the visit spent counseling patient and staff, reviewing records , Labs and coordinating care for problems addressed at this encounter.    Labs/tests ordered:

## 2024-03-22 ENCOUNTER — Other Ambulatory Visit

## 2024-03-22 ENCOUNTER — Encounter: Payer: Self-pay | Admitting: Adult Health

## 2024-03-22 ENCOUNTER — Telehealth: Payer: Self-pay | Admitting: Cardiology

## 2024-03-22 ENCOUNTER — Non-Acute Institutional Stay (SKILLED_NURSING_FACILITY): Payer: Self-pay | Admitting: Adult Health

## 2024-03-22 ENCOUNTER — Other Ambulatory Visit: Payer: Self-pay | Admitting: Urology

## 2024-03-22 DIAGNOSIS — R310 Gross hematuria: Secondary | ICD-10-CM

## 2024-03-22 DIAGNOSIS — N3946 Mixed incontinence: Secondary | ICD-10-CM

## 2024-03-22 DIAGNOSIS — F331 Major depressive disorder, recurrent, moderate: Secondary | ICD-10-CM

## 2024-03-22 DIAGNOSIS — R350 Frequency of micturition: Secondary | ICD-10-CM

## 2024-03-22 MED ORDER — MIRTAZAPINE 7.5 MG PO TABS: 7.5000 mg | ORAL_TABLET | Freq: Every day | ORAL | Status: AC

## 2024-03-22 NOTE — Telephone Encounter (Signed)
 Spoke with [facility], the patient is feeling more depressed and tearful around this time of the year and want to make sure any medications changes are okay with us . Aware will forward to dr jeffrie and the pharmacist.

## 2024-03-22 NOTE — Progress Notes (Addendum)
 Location:  [facility] Room Number: [name] 3 [id number] Place of Service:  SNF [phone]) Provider: Tawni America, NP   Patient Care Team: [name], MD as PCP - General (Internal Medicine) Jeffrie Oneil BROCKS, MD as PCP - Cardiology (Cardiology) [name], MD (Inactive) as PCP - Electrophysiology (Cardiology) Alix Charleston, MD as Consulting Physician (Neurosurgery)  Extended Emergency Contact Information Primary Emergency Contact: Kallman,[name] Address: [address]          [place] [phone] [place] Home Phone: [phone] Mobile Phone: [phone] Relation: Spouse Secondary Emergency Contact: Tancredi,[name] Address: [address]          [place], [place] [address] [place] Mobile Phone: [phone] Relation: Son  Code Status:  Full Code Goals of care: Advanced Directive information    03/15/2024   10:13 AM  Advanced Directives  Does Patient Have a Medical Advance Directive? Yes  Type of Advance Directive Living will  Does patient want to make changes to medical advance directive? No - Patient declined     Chief Complaint  Patient presents with   Depression    HPI:  Pt is a 78 y.o. female seen today for an acute visit for depression  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 She has lived in skilled care for several years and is feeling depressed. She has a low appetite chronically. Spends most of the day in her room with her husband.  She has two sons and has friends visit. Does not participate in activities at [facility]. Sleeps a lot during the day but has trouble sleeping at night.  She thinks about dying everyday. But denies suicidal ideation. Consider being a DNR.  She denies feeling jittery or anxious, no racing thoughts. She misses her home that she used to live in. When she goes home now its a reminder  that she no longer lives there. She has thought of going back home to live with her husband is looking into getting a stair lift.     Past Medical History:  Diagnosis Date   Anxiety    Arthritis    some in my lower back; probably elbows, knees (11/18/2017)   Atrial fibrillation (HCC)    Bell's palsy    when pt. was 77 yrs old, when under stress the left side of face will droop.   Complication of anesthesia    vascular OR 2016; BP bottomed out; couldn't get it regulated; ended up in ICU for DAYS (11/18/2017)   GERD (gastroesophageal reflux disease)    History of kidney stones    Hypertension    Hypertrophic cardiomyopathy (HCC)    severe LV basilar hypertrophy witn no evidence of significant outflow tract obstruction, EF 65-70%, mild LAE, mild TR, grade 1a diastolic dysfunction 05/15/10 (Dr. Oneil Jeffrie) (Atrial Septal Hypertrophy pattern)-- Intra-op TEE with dsignificant outflow tract obstruction - AI, MR & TR   Insomnia    Mild aortic sclerosis    Osteopenia    Peripheral vascular disease    Syncope    , Vagal   Past Surgical History:  Procedure Laterality Date   AUGMENTATION MAMMAPLASTY Bilateral    BACK SURGERY     CARDIAC CATHETERIZATION N/A 05/07/2016   Procedure: Left Heart Cath and Coronary Angiography;  Surgeon: Lonni JONETTA Cash, MD;  Location: [facility] INVASIVE CV LAB;  Service: Cardiovascular;  Laterality: N/A;  CARDIOVERSION N/A 09/24/2017   Procedure: CARDIOVERSION;  Surgeon: Rolan Ezra RAMAN, MD;  Location: [facility] ENDOSCOPY;  Service: Cardiovascular;  Laterality: N/A;   DILATION AND CURETTAGE OF UTERUS     ENDARTERECTOMY FEMORAL Right 03/02/2015   Procedure: ENDARTERECTOMY RIGHT FEMORAL;  Surgeon: Lonni RAMAN Blade, MD;  Location: [facility] OR;  Service: Vascular;  Laterality: Right;   ESOPHAGOGASTRODUODENOSCOPY (EGD) WITH PROPOFOL  N/A 12/01/2020   Procedure: ESOPHAGOGASTRODUODENOSCOPY (EGD) WITH PROPOFOL ;  Surgeon: Paola Dreama SAILOR, MD;  Location: [facility] ENDOSCOPY;  Service:  General;  Laterality: N/A;   FACIAL COSMETIC SURGERY Left 2002   related to Bell's Palsy @ age 42; left eye/side of face droopy; tried to make area symmetrical   FEMORAL-POPLITEAL BYPASS GRAFT Right 03/02/2015   Procedure: BYPASS GRAFT FEMORAL-BELOW KNEE POPLITEAL ARTERY;  Surgeon: Lonni RAMAN Blade, MD;  Location: [facility] OR;  Service: Vascular;  Laterality: Right;   INGUINAL HERNIA REPAIR Bilateral 2002   IR ANGIO INTRA EXTRACRAN SEL COM CAROTID INNOMINATE UNI L MOD SED  11/16/2020   IR CT HEAD LTD  11/13/2020   IR PERCUTANEOUS ART THROMBECTOMY/INFUSION INTRACRANIAL INC DIAG ANGIO  11/13/2020   OVARIAN CYST REMOVAL Left    PEG PLACEMENT N/A 12/01/2020   Procedure: PERCUTANEOUS ENDOSCOPIC GASTROSTOMY (PEG) PLACEMENT;  Surgeon: Paola Dreama SAILOR, MD;  Location: [facility] ENDOSCOPY;  Service: General;  Laterality: N/A;   PERIPHERAL VASCULAR CATHETERIZATION N/A 01/16/2015   Procedure: Abdominal Aortogram;  Surgeon: Lonni RAMAN Blade, MD;  Location: [facility] INVASIVE CV LAB;  Service: Cardiovascular;  Laterality: N/A;   POSTERIOR LUMBAR FUSION  2015   have plates and screws in there   RADIOLOGY WITH ANESTHESIA N/A 11/13/2020   Procedure: IR WITH ANESTHESIA;  Surgeon: Radiologist, Medication, MD;  Location: [facility] OR;  Service: Radiology;  Laterality: N/A;   TONSILLECTOMY      Allergies  Allergen Reactions   Amoxicillin Other (See Comments)    UTI Has patient had a PCN reaction causing immediate rash, facial/tongue/throat swelling, SOB or lightheadedness with hypotension: No Has patient had a PCN reaction causing severe rash involving mucus membranes or skin necrosis: No Has patient had a PCN reaction that required hospitalization: No Has patient had a PCN reaction occurring within the last 10 years: Yes--UTI ONLY If all of the above answers are NO, then may proceed with Cephalosporin use.    Atenolol Cough   Crestor  [Rosuvastatin  Calcium ] Other (See Comments)    Muscle aches   Pravastatin  Other (See  Comments)    Muscle aches   Rosuvastatin  Calcium     Sulfa Antibiotics Nausea Only   Codeine Other (See Comments)    hallucinations    Outpatient Encounter Medications as of 03/22/2024  Medication Sig   acetaminophen  (TYLENOL ) 325 MG tablet Take 650 mg by mouth every 4 (four) hours as needed.   acetaminophen  (TYLENOL ) 500 MG tablet Take 500 mg by mouth. Give 500 mg by mouth every 3 hours as needed for Pain after oral surgery Alternate with Ibuprofen 600 mg PO for pain q 3 hrs.   albuterol  (VENTOLIN  HFA) 108 (90 Base) MCG/ACT inhaler Inhale 2 puffs into the lungs every 4 (four) hours as needed for wheezing or shortness of breath.   amiodarone  (PACERONE ) 100 MG tablet Take 100 mg by mouth daily.   amLODipine  (NORVASC ) 2.5 MG tablet Take 2.5 mg by mouth. Give 5 mg in AM and 2.5 mg in PM   apixaban  (ELIQUIS ) 5 MG TABS tablet Take 5 mg by mouth 2 (two) times daily. May give crushed  Benzonatate  (TESSALON  PERLES PO) Take 200 mg by mouth as needed. 200mg , oral as needed, Tessalon  Perles, 200mg , PO TID PRN cough   buPROPion  (WELLBUTRIN  XL) 150 MG 24 hr tablet Take 150 mg by mouth daily.   Calcium -Vitamin D -Vitamin K (VIACTIV CALCIUM  PLUS D) 650-12.5-40 MG-MCG-MCG CHEW Chew 1 tablet by mouth daily.   Cholecalciferol (VITAMIN D3) 20 MCG (800 UNIT) TABS Take 1 tablet by mouth daily.   cloNIDine (CATAPRES) 0.1 MG tablet Take 0.1 mg by mouth daily as needed.   denosumab (PROLIA) 60 MG/ML SOSY injection Inject 60 mg into the skin every 6 (six) months.   diclofenac Sodium (VOLTAREN) 1 % GEL Apply 4 g topically every 6 (six) hours as needed. Apply one application to right hip and knee topically.   Emollient (CERAVE) CREA Apply 1 Application topically as needed.   Eyelid Cleansers (OCUSOFT EYELID CLEANSING) PADS Apply 1 Application topically daily.   ezetimibe  (ZETIA ) 10 MG tablet Take 10 mg by mouth daily. Oral if crushed   furosemide  (LASIX ) 20 MG tablet Take 20 mg by mouth daily as needed. Give Lasix   20mg  for weight gain equal or greater than 3 pounds   hydrALAZINE  (APRESOLINE ) 10 MG tablet Take 10 mg by mouth 3 (three) times daily as needed (SBP > 165 or DBP > 95). (Patient taking differently: Take 10 mg by mouth as needed (SBP > 165 or DBP > 95).  Give 10 mg by mouth as needed for SBP >170 daily prn if SBP >170 2 hrs after administration of PRN clonidine)   hydrALAZINE  (APRESOLINE ) 10 MG tablet Take 2 tablets (20 mg total) by mouth 3 (three) times daily.   hydrocortisone cream 1 % Apply 1 Application topically 2 (two) times daily as needed for itching.   ibuprofen (ADVIL) 600 MG tablet Take 600 mg by mouth. Give 600 mg by mouth every 03 hours as needed for For pain following oral surgery Alternate Ibuprofen 600 mg PO q3h with Tylenol  500 mg PO   ipratropium-albuterol  (DUONEB) 0.5-2.5 (3) MG/3ML SOLN Inhale 3 mLs into the lungs every 6 (six) hours as needed.   ketoconazole  (NIZORAL ) 2 % cream Apply 1 application  topically every 12 (twelve) hours as needed for irritation.   ondansetron  (ZOFRAN ) 4 MG tablet Take 4 mg by mouth every 6 (six) hours as needed for nausea or vomiting.   OXYGEN  Inhale 3 L into the lungs as directed. to maintain sats > or equal to 90%. May titrate Three Times A Day; 07:00 AM - 03:00 PM, 03:00 PM - 11:00 PM, 11:00 PM - 7:00 am   pantoprazole  (PROTONIX ) 40 MG tablet Take 40 mg by mouth 2 (two) times daily.   Polyethyl Glycol-Propyl Glycol (SYSTANE) 0.4-0.3 % SOLN Apply 2 drops to eye every 6 (six) hours as needed (dry eyes).   polyethylene glycol powder (GLYCOLAX /MIRALAX ) 17 GM/SCOOP powder Take 17 g by mouth every other day.   sennosides-docusate sodium  (SENOKOT-S) 8.6-50 MG tablet Take 2 tablets by mouth 2 (two) times daily.   Tiotropium Bromide-Olodaterol (STIOLTO RESPIMAT ) 2.5-2.5 MCG/ACT AERS Inhale 2 puffs into the lungs daily.   Vitamin D , Ergocalciferol , (DRISDOL ) 1.25 MG (50000 UNIT) CAPS capsule Take 1 capsule (50,000 Units total) by mouth every 7 (seven)  days for 6 doses.   bisacodyl  (DULCOLAX) 5 MG EC tablet Take 5 mg by mouth 2 (two) times daily as needed for moderate constipation. (Patient not taking: Reported on 03/22/2024)   lidocaine  (LIDODERM ) 5 % Place 1 patch onto the skin daily.  Remove & Discard patch within 12 hours or as directed by MD (Patient not taking: Reported on 03/22/2024)   methocarbamol  (ROBAXIN ) 500 MG tablet Take 250 mg by mouth every 6 (six) hours as needed. (Patient not taking: Reported on 03/22/2024)   zinc oxide 20 % ointment Apply 1 Application topically 2 (two) times daily as needed for irritation. (Patient not taking: Reported on 03/22/2024)   No facility-administered encounter medications on file as of 03/22/2024.    Review of Systems  Constitutional: Negative.   Musculoskeletal:  Positive for gait problem.  Psychiatric/Behavioral:  Positive for dysphoric mood and sleep disturbance. Negative for agitation, behavioral problems, confusion, hallucinations, self-injury and suicidal ideas. The patient is not nervous/anxious and is not hyperactive.     Immunization History  Administered Date(s) Administered   Fluad Quad(high Dose 65+) 02/05/2022, 01/08/2023   Fluzone Influenza virus vaccine,trivalent (IIV3), split virus 04/30/2010, 01/26/2016, 02/07/2018, 02/01/2020   INFLUENZA, HIGH DOSE SEASONAL PF 02/07/2018, 01/25/2019, 02/28/2021, 02/05/2022, 01/13/2024   Influenza Split 01/16/2009   Influenza,inj,Quad PF,6+ Mos 03/06/2012, 01/26/2016, 01/10/2017   Influenza,inj,quad, With Preservative 02/14/2015   Moderna Covid-19 Fall Seasonal Vaccine 66yrs & older 01/13/2024   Moderna Covid-19 Vaccine Bivalent Booster 14yrs & up 02/28/2021, 02/04/2023   PFIZER(Purple Top)SARS-COV-2 Vaccination 05/06/2019, 05/27/2019, 01/27/2020, 09/18/2020   Pneumococcal Conjugate-13 03/19/2017   Pneumococcal Polysaccharide-23 02/14/2015   Respiratory Syncytial Virus Vaccine,Recomb Aduvanted(Arexvy) 02/15/2022   Td 02/25/2005   Tdap  05/19/2013   Zoster, Live 07/28/2006   Pertinent  Health Maintenance Due  Topic Date Due   Influenza Vaccine  Completed   Mammogram  Discontinued   Bone Density Scan  Discontinued      11/26/2022   12:34 PM 12/31/2022    3:26 PM 03/11/2023    2:48 PM 12/16/2023    3:28 PM 03/02/2024    3:02 PM  Fall Risk  Falls in the past year? 0 0 1 0 0  Was there an injury with Fall? 0  0  0  0  0   Fall Risk Category Calculator 0 0 1 0 0  Patient at Risk for Falls Due to History of fall(s);Impaired balance/gait History of fall(s);Impaired balance/gait;Impaired mobility History of fall(s);Impaired balance/gait;Impaired mobility History of fall(s);Impaired balance/gait;Impaired mobility Impaired balance/gait  Fall risk Follow up Falls evaluation completed;Education provided Falls evaluation completed;Education provided;Falls prevention discussed Falls evaluation completed;Education provided;Falls prevention discussed Falls evaluation completed Falls evaluation completed     Data saved with a previous flowsheet row definition   Functional Status Survey:    Vitals:   03/22/24 0940  BP: (!) 162/80  Pulse: 60  Resp: 16  Temp: (!) 97.3 F (36.3 C)  SpO2: 96%  Weight: 91 lb (41.3 kg)  Height: 5' 5 (1.651 m)   Body mass index is 15.14 kg/m. Physical Exam Vitals and nursing note reviewed.  Constitutional:      Appearance: Normal appearance.  Neurological:     Mental Status: She is alert. Mental status is at baseline.  Psychiatric:     Comments: teaful     Labs reviewed: Recent Labs    10/15/23 0000 02/18/24 0900 03/06/24 0134  NA 141 143 146*  K 4.9 4.5 3.6  CL 105 104 106  CO2 25* 24* 26  GLUCOSE  --   --  96  BUN 34* 39* 25*  CREATININE 0.9 1.0 0.80  CALCIUM  9.2 8.9 8.8*   Recent Labs    05/21/23 0000 06/24/23 0000 02/18/24 0900  AST 30 30 22   ALT 35 30  17  ALKPHOS 93 95 64  ALBUMIN  3.4* 3.0* 3.7   Recent Labs    10/15/23 0000 02/18/24 0900 03/06/24 0134  WBC  6.7 7.1 7.1  NEUTROABS  --  4.30 4.5  HGB 13.3 13.5 12.5  HCT 41 42 40.1  MCV  --   --  95.5  PLT 221 234 238   Lab Results  Component Value Date   TSH 0.76 02/18/2024   Lab Results  Component Value Date   HGBA1C 6.0 (H) 11/13/2020   Lab Results  Component Value Date   CHOL 182 02/18/2024   HDL 78 (A) 02/18/2024   LDLCALC 85 02/18/2024   TRIG 96 02/18/2024   CHOLHDL 3.0 11/13/2020    Significant Diagnostic Results in last 30 days:  No results found.  Assessment/Plan  1. Moderate episode of recurrent major depressive disorder (HCC) (Primary)  Feelings of depression due to functional losses from prior CVA She would very much like to take medication Given that her HTN has been an issue increasing the wellbutrin  is not currently an option.  Given that she has a low BMI and difficulty sleeping adding remeron  would be a good option.  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   - mirtazapine  (REMERON ) 7.5 MG tablet; Take 1 tablet (7.5 mg total) by mouth at bedtime.  Also recommend socialization, time each day to herself to process her thoughts, spiritual connection, and sunlight.   BMP in 2 weeks

## 2024-03-22 NOTE — Telephone Encounter (Signed)
 Left message for patient to call back

## 2024-03-22 NOTE — Telephone Encounter (Signed)
 Pt c/o medication issue:  1. Name of Medication: mirtazapine  (REMERON ) 7.5 MG tablet   2. How are you currently taking this medication (dosage and times per day)?  As written  3. Are you having a reaction (difficulty breathing--STAT)? No  4. What is your medication issue? Dr at [facility] wants to start  her on this medication. Please advise.

## 2024-03-25 ENCOUNTER — Non-Acute Institutional Stay (SKILLED_NURSING_FACILITY): Payer: Self-pay | Admitting: Adult Health

## 2024-03-25 DIAGNOSIS — R9389 Abnormal findings on diagnostic imaging of other specified body structures: Secondary | ICD-10-CM

## 2024-03-25 DIAGNOSIS — R112 Nausea with vomiting, unspecified: Secondary | ICD-10-CM

## 2024-03-25 DIAGNOSIS — J181 Lobar pneumonia, unspecified organism: Secondary | ICD-10-CM

## 2024-03-25 NOTE — Telephone Encounter (Signed)
 Jeffrie Oneil BROCKS, MD to [facility] Triage  [facility]     03/22/24  5:09 PM OK to use Remeron .  -Oneil Jeffrie, MD    Left message of the above information and requested Misty call back if any further questions or concerns.

## 2024-03-27 ENCOUNTER — Encounter: Payer: Self-pay | Admitting: Adult Health

## 2024-03-27 NOTE — Progress Notes (Signed)
 Location:  Medical Illustrator of Service:  SNF (31) Provider:   Bari America, ANP [facility] [phone]  [name], MD  Patient Care Team: [name], MD as PCP - General (Internal Medicine) Jeffrie Oneil BROCKS, MD as PCP - Cardiology (Cardiology) [name], MD (Inactive) as PCP - Electrophysiology (Cardiology) Alix Charleston, MD as Consulting Physician (Neurosurgery)  Extended Emergency Contact Information Primary Emergency Contact: Vanmeter,[name] Address: [address]          [place] [phone] [place] Home Phone: [phone] Mobile Phone: [phone] Relation: Spouse Secondary Emergency Contact: Deadwyler,[name] Address: [address]          [place], [place] [address] [place] Mobile Phone: [phone] Relation: Son  Code Status:  Full Goals of care: Advanced Directive information    03/15/2024   10:13 AM  Advanced Directives  Does Patient Have a Medical Advance Directive? Yes  Type of Advance Directive Living will  Does patient want to make changes to medical advance directive? No - Patient declined     Chief Complaint  Patient presents with   Acute Visit    Vomiting low 02 sat    HPI:  Pt is a 78 y.o. female seen today for an acute visit for vomiting, and low 02 sat  Ms. 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  The nurse reported that she had two vomiting episodes the day prior and still felt ill this morning. She has body aches, mild abd discomfort.  No sob or cough or sputum productions but sats were 86% on 2 liters. She is chronically 02 dependent due to COPD with prior smoking hx Pt reports she does not feel she aspirated during the vomiting episode. She started on Remeron  1 day ago for depression but states she felt bad before it started.   She has a hx of gallstones, intermittently  feels nauseous and takes zofran  prn.  No fever  noted.  Very small appetite at baseline, reduced water  intake at baseline  Hx of constipation, frequently refuses miralax  and senokot.   KUB completed 03/25/24 which shows a nonspecific bowel gas pattern with no obstruction. Stool throughout the colon NO significant constipation.  CXR 03/25/24 Right lobar opacity  Of note she is followed by pulmonary and is scheduled for a CT of the chest. They are monitoring  an area of concern in the right lower lobe with volume loss and consolidation with possible chronic aspiration which has been present since 2022.  There is mild stable adenopathy in the mediastinum.  CMP and CBC were ordered and are normal  Covid and flu swab are negative.  Past Medical History:  Diagnosis Date   Anxiety    Arthritis    some in my lower back; probably elbows, knees (11/18/2017)   Atrial fibrillation (HCC)    Bell's palsy    when pt. was 78 yrs old, when under stress the left side of face will droop.   Complication of anesthesia    vascular OR 2016; BP bottomed out; couldn't get it regulated; ended up in ICU for DAYS (11/18/2017)   GERD (gastroesophageal reflux disease)    History of kidney stones    Hypertension    Hypertrophic cardiomyopathy (HCC)    severe LV basilar hypertrophy witn no evidence of significant outflow tract obstruction, EF 65-70%, mild LAE, mild  TR, grade 1a diastolic dysfunction 05/15/10 (Dr. Oneil Parchment) (Atrial Septal Hypertrophy pattern)-- Intra-op TEE with dsignificant outflow tract obstruction - AI, MR & TR   Insomnia    Mild aortic sclerosis    Osteopenia    Peripheral vascular disease    Syncope    , Vagal   Past Surgical History:  Procedure Laterality Date   AUGMENTATION MAMMAPLASTY Bilateral    BACK SURGERY     CARDIAC CATHETERIZATION N/A 05/07/2016   Procedure: Left Heart Cath and Coronary Angiography;  Surgeon: Lonni JONETTA Cash, MD;  Location: [facility] INVASIVE CV LAB;   Service: Cardiovascular;  Laterality: N/A;   CARDIOVERSION N/A 09/24/2017   Procedure: CARDIOVERSION;  Surgeon: Rolan Ezra RAMAN, MD;  Location: [facility] ENDOSCOPY;  Service: Cardiovascular;  Laterality: N/A;   DILATION AND CURETTAGE OF UTERUS     ENDARTERECTOMY FEMORAL Right 03/02/2015   Procedure: ENDARTERECTOMY RIGHT FEMORAL;  Surgeon: Lonni RAMAN Blade, MD;  Location: [facility] OR;  Service: Vascular;  Laterality: Right;   ESOPHAGOGASTRODUODENOSCOPY (EGD) WITH PROPOFOL  N/A 12/01/2020   Procedure: ESOPHAGOGASTRODUODENOSCOPY (EGD) WITH PROPOFOL ;  Surgeon: Paola Dreama SAILOR, MD;  Location: [facility] ENDOSCOPY;  Service: General;  Laterality: N/A;   FACIAL COSMETIC SURGERY Left 2002   related to Bell's Palsy @ age 41; left eye/side of face droopy; tried to make area symmetrical   FEMORAL-POPLITEAL BYPASS GRAFT Right 03/02/2015   Procedure: BYPASS GRAFT FEMORAL-BELOW KNEE POPLITEAL ARTERY;  Surgeon: Lonni RAMAN Blade, MD;  Location: [facility] OR;  Service: Vascular;  Laterality: Right;   INGUINAL HERNIA REPAIR Bilateral 2002   IR ANGIO INTRA EXTRACRAN SEL COM CAROTID INNOMINATE UNI L MOD SED  11/16/2020   IR CT HEAD LTD  11/13/2020   IR PERCUTANEOUS ART THROMBECTOMY/INFUSION INTRACRANIAL INC DIAG ANGIO  11/13/2020   OVARIAN CYST REMOVAL Left    PEG PLACEMENT N/A 12/01/2020   Procedure: PERCUTANEOUS ENDOSCOPIC GASTROSTOMY (PEG) PLACEMENT;  Surgeon: Paola Dreama SAILOR, MD;  Location: [facility] ENDOSCOPY;  Service: General;  Laterality: N/A;   PERIPHERAL VASCULAR CATHETERIZATION N/A 01/16/2015   Procedure: Abdominal Aortogram;  Surgeon: Lonni RAMAN Blade, MD;  Location: [facility] INVASIVE CV LAB;  Service: Cardiovascular;  Laterality: N/A;   POSTERIOR LUMBAR FUSION  2015   have plates and screws in there   RADIOLOGY WITH ANESTHESIA N/A 11/13/2020   Procedure: IR WITH ANESTHESIA;  Surgeon: Radiologist, Medication, MD;  Location: [facility] OR;  Service: Radiology;  Laterality: N/A;   TONSILLECTOMY      Allergies[1]  Outpatient Encounter  Medications as of 03/25/2024  Medication Sig   acetaminophen  (TYLENOL ) 325 MG tablet Take 650 mg by mouth every 4 (four) hours as needed.   albuterol  (VENTOLIN  HFA) 108 (90 Base) MCG/ACT inhaler Inhale 2 puffs into the lungs every 4 (four) hours as needed for wheezing or shortness of breath.   amiodarone  (PACERONE ) 100 MG tablet Take 100 mg by mouth daily.   amLODipine  (NORVASC ) 2.5 MG tablet Take 2.5 mg by mouth. Give 5 mg in AM and 2.5 mg in PM   apixaban  (ELIQUIS ) 5 MG TABS tablet Take 5 mg by mouth 2 (two) times daily. May give crushed   Benzonatate  (TESSALON  PERLES PO) Take 200 mg by mouth as needed. 200mg , oral as needed, Tessalon  Perles, 200mg , PO TID PRN cough   buPROPion  (WELLBUTRIN  XL) 150 MG 24 hr tablet Take 150 mg by mouth daily.   cloNIDine (CATAPRES) 0.1 MG tablet Take 0.1 mg by mouth daily as needed.   denosumab (PROLIA) 60 MG/ML SOSY injection Inject 60 mg into  the skin every 6 (six) months.   diclofenac Sodium (VOLTAREN) 1 % GEL Apply 4 g topically every 6 (six) hours as needed. Apply one application to right hip and knee topically.   Emollient (CERAVE) CREA Apply 1 Application topically as needed.   ezetimibe  (ZETIA ) 10 MG tablet Take 10 mg by mouth daily. Oral if crushed   furosemide  (LASIX ) 20 MG tablet Take 20 mg by mouth daily as needed. Give Lasix  20mg  for weight gain equal or greater than 3 pounds   hydrALAZINE  (APRESOLINE ) 10 MG tablet Take 10 mg by mouth 3 (three) times daily as needed (SBP > 165 or DBP > 95).   hydrALAZINE  (APRESOLINE ) 10 MG tablet Take 2 tablets (20 mg total) by mouth 3 (three) times daily.   hydrocortisone cream 1 % Apply 1 Application topically 2 (two) times daily as needed for itching.   ibuprofen (ADVIL) 600 MG tablet Take 600 mg by mouth. Give 600 mg by mouth every 03 hours as needed for For pain following oral surgery Alternate Ibuprofen 600 mg PO q3h with Tylenol  500 mg PO   ipratropium-albuterol  (DUONEB) 0.5-2.5 (3) MG/3ML SOLN Inhale 3 mLs  into the lungs every 6 (six) hours as needed.   ketoconazole  (NIZORAL ) 2 % cream Apply 1 application  topically every 12 (twelve) hours as needed for irritation.   mirtazapine  (REMERON ) 7.5 MG tablet Take 1 tablet (7.5 mg total) by mouth at bedtime.   ondansetron  (ZOFRAN ) 4 MG tablet Take 4 mg by mouth every 6 (six) hours as needed for nausea or vomiting.   OXYGEN  Inhale 3 L into the lungs as directed. to maintain sats > or equal to 90%. May titrate Three Times A Day; 07:00 AM - 03:00 PM, 03:00 PM - 11:00 PM, 11:00 PM - 7:00 am   pantoprazole  (PROTONIX ) 40 MG tablet Take 40 mg by mouth 2 (two) times daily.   Polyethyl Glycol-Propyl Glycol (SYSTANE) 0.4-0.3 % SOLN Apply 2 drops to eye every 6 (six) hours as needed (dry eyes).   polyethylene glycol powder (GLYCOLAX /MIRALAX ) 17 GM/SCOOP powder Take 17 g by mouth every other day.   sennosides-docusate sodium  (SENOKOT-S) 8.6-50 MG tablet Take 2 tablets by mouth 2 (two) times daily.   Tiotropium Bromide-Olodaterol (STIOLTO RESPIMAT ) 2.5-2.5 MCG/ACT AERS Inhale 2 puffs into the lungs daily.   Vitamin D , Ergocalciferol , (DRISDOL ) 1.25 MG (50000 UNIT) CAPS capsule Take 1 capsule (50,000 Units total) by mouth every 7 (seven) days for 6 doses.   Calcium -Vitamin D -Vitamin K (VIACTIV CALCIUM  PLUS D) 650-12.5-40 MG-MCG-MCG CHEW Chew 1 tablet by mouth daily.   Cholecalciferol (VITAMIN D3) 20 MCG (800 UNIT) TABS Take 1 tablet by mouth daily.   Eyelid Cleansers (OCUSOFT EYELID CLEANSING) PADS Apply 1 Application topically daily.   [DISCONTINUED] acetaminophen  (TYLENOL ) 500 MG tablet Take 500 mg by mouth. Give 500 mg by mouth every 3 hours as needed for Pain after oral surgery Alternate with Ibuprofen 600 mg PO for pain q 3 hrs.   [DISCONTINUED] bisacodyl  (DULCOLAX) 5 MG EC tablet Take 5 mg by mouth 2 (two) times daily as needed for moderate constipation. (Patient not taking: Reported on 03/22/2024)   [DISCONTINUED] lidocaine  (LIDODERM ) 5 % Place 1 patch onto the  skin daily. Remove & Discard patch within 12 hours or as directed by MD (Patient not taking: Reported on 03/22/2024)   [DISCONTINUED] methocarbamol  (ROBAXIN ) 500 MG tablet Take 250 mg by mouth every 6 (six) hours as needed. (Patient not taking: Reported on 03/22/2024)   [DISCONTINUED] zinc  oxide 20 % ointment Apply 1 Application topically 2 (two) times daily as needed for irritation. (Patient not taking: Reported on 03/22/2024)   No facility-administered encounter medications on file as of 03/25/2024.    Review of Systems  Constitutional:  Positive for fatigue. Negative for activity change, appetite change, chills, diaphoresis and fever.  HENT:  Negative for congestion.   Respiratory:  Negative for cough, shortness of breath and wheezing.   Cardiovascular:  Negative for chest pain and leg swelling.  Gastrointestinal:  Positive for abdominal pain (more of a cramp that is mild and generalized), nausea and vomiting. Negative for abdominal distention, constipation and diarrhea.  Genitourinary:  Negative for difficulty urinating, dysuria and urgency.  Musculoskeletal:  Positive for gait problem. Negative for back pain, myalgias and neck pain.  Skin:  Negative for rash.  Neurological:  Positive for facial asymmetry, speech difficulty and weakness. Negative for dizziness.  Psychiatric/Behavioral:  Negative for confusion.     Immunization History  Administered Date(s) Administered   Fluad Quad(high Dose 65+) 02/05/2022, 01/08/2023   Fluzone Influenza virus vaccine,trivalent (IIV3), split virus 04/30/2010, 01/26/2016, 02/07/2018, 02/01/2020   INFLUENZA, HIGH DOSE SEASONAL PF 02/07/2018, 01/25/2019, 02/28/2021, 02/05/2022, 01/13/2024   Influenza Split 01/16/2009   Influenza,inj,Quad PF,6+ Mos 03/06/2012, 01/26/2016, 01/10/2017   Influenza,inj,quad, With Preservative 02/14/2015   Moderna Covid-19 Fall Seasonal Vaccine 30yrs & older 01/13/2024   Moderna Covid-19 Vaccine Bivalent Booster 68yrs & up  02/28/2021, 02/04/2023   PFIZER(Purple Top)SARS-COV-2 Vaccination 05/06/2019, 05/27/2019, 01/27/2020, 09/18/2020   Pneumococcal Conjugate-13 03/19/2017   Pneumococcal Polysaccharide-23 02/14/2015   Respiratory Syncytial Virus Vaccine,Recomb Aduvanted(Arexvy) 02/15/2022   Td 02/25/2005   Tdap 05/19/2013   Zoster, Live 07/28/2006   Pertinent  Health Maintenance Due  Topic Date Due   Influenza Vaccine  Completed   Mammogram  Discontinued   Bone Density Scan  Discontinued      11/26/2022   12:34 PM 12/31/2022    3:26 PM 03/11/2023    2:48 PM 12/16/2023    3:28 PM 03/02/2024    3:02 PM  Fall Risk  Falls in the past year? 0 0 1 0 0  Was there an injury with Fall? 0  0  0  0  0   Fall Risk Category Calculator 0 0 1 0 0  Patient at Risk for Falls Due to History of fall(s);Impaired balance/gait History of fall(s);Impaired balance/gait;Impaired mobility History of fall(s);Impaired balance/gait;Impaired mobility History of fall(s);Impaired balance/gait;Impaired mobility Impaired balance/gait  Fall risk Follow up Falls evaluation completed;Education provided Falls evaluation completed;Education provided;Falls prevention discussed Falls evaluation completed;Education provided;Falls prevention discussed Falls evaluation completed Falls evaluation completed     Data saved with a previous flowsheet row definition   Functional Status Survey:    Vitals:   03/27/24 0720  BP: 128/68  Pulse: (!) 56  Resp: 20  Temp: (!) 96.8 F (36 C)  SpO2: (!) 86%   There is no height or weight on file to calculate BMI. Physical Exam Vitals and nursing note reviewed.  Constitutional:      General: She is not in acute distress.    Appearance: She is not diaphoretic.  HENT:     Head: Normocephalic and atraumatic.     Nose: Nose normal.     Mouth/Throat:     Mouth: Mucous membranes are moist.     Pharynx: Oropharynx is clear.  Neck:     Vascular: No JVD.  Cardiovascular:     Rate and Rhythm: Normal  rate and regular rhythm.  Heart sounds: Murmur heard.  Pulmonary:     Effort: Pulmonary effort is normal. No respiratory distress.     Breath sounds: No wheezing.     Comments: Decreased right base Abdominal:     General: Abdomen is flat. There is no distension.     Tenderness: There is no abdominal tenderness. There is no right CVA tenderness, left CVA tenderness or guarding.     Comments: Hypoactive bowel sounds.   Musculoskeletal:     Right lower leg: No edema.     Left lower leg: No edema.  Skin:    General: Skin is warm and dry.     Comments: Right sided weakness.   Neurological:     Mental Status: She is alert and oriented to person, place, and time. Mental status is at baseline.     Labs reviewed: Recent Labs    10/15/23 0000 02/18/24 0900 03/06/24 0134  NA 141 143 146*  K 4.9 4.5 3.6  CL 105 104 106  CO2 25* 24* 26  GLUCOSE  --   --  96  BUN 34* 39* 25*  CREATININE 0.9 1.0 0.80  CALCIUM  9.2 8.9 8.8*   Recent Labs    05/21/23 0000 06/24/23 0000 02/18/24 0900  AST 30 30 22   ALT 35 30 17  ALKPHOS 93 95 64  ALBUMIN  3.4* 3.0* 3.7   Recent Labs    10/15/23 0000 02/18/24 0900 03/06/24 0134  WBC 6.7 7.1 7.1  NEUTROABS  --  4.30 4.5  HGB 13.3 13.5 12.5  HCT 41 42 40.1  MCV  --   --  95.5  PLT 221 234 238   Lab Results  Component Value Date   TSH 0.76 02/18/2024   Lab Results  Component Value Date   HGBA1C 6.0 (H) 11/13/2020   Lab Results  Component Value Date   CHOL 182 02/18/2024   HDL 78 (A) 02/18/2024   LDLCALC 85 02/18/2024   TRIG 96 02/18/2024   CHOLHDL 3.0 11/13/2020    Significant Diagnostic Results in last 30 days:  No results found.  Assessment/Plan 1. Lobar pneumonia (Primary) She does have low 02 sats and is not feeling well with concern for aspiration given her recent vomiting episode.  Will add doxycycline  for 7 days Duonebs prn  Sats improved with 02 titration  2. Abnormal CT of the chest There is an area of concern  to the RLL of which is chronic making it difficult to assess for acute pneumonia in this setting.  CT 12/18 pending.  3. Nausea and vomiting, unspecified vomiting type CMP with normal LFTs Normal WBCs Recommend taking her laxatives that are already ordered and zofran  prn Report if worsening or not improving.      Labs/tests ordered:  CBC CMP KUB CXR done       [1]  Allergies Allergen Reactions   Amoxicillin Other (See Comments)    UTI Has patient had a PCN reaction causing immediate rash, facial/tongue/throat swelling, SOB or lightheadedness with hypotension: No Has patient had a PCN reaction causing severe rash involving mucus membranes or skin necrosis: No Has patient had a PCN reaction that required hospitalization: No Has patient had a PCN reaction occurring within the last 10 years: Yes--UTI ONLY If all of the above answers are NO, then may proceed with Cephalosporin use.    Atenolol Cough   Crestor  [Rosuvastatin  Calcium ] Other (See Comments)    Muscle aches   Pravastatin  Other (See Comments)    Muscle aches  Rosuvastatin  Calcium     Sulfa Antibiotics Nausea Only   Codeine Other (See Comments)    hallucinations

## 2024-03-30 ENCOUNTER — Telehealth: Payer: Self-pay

## 2024-03-30 NOTE — Telephone Encounter (Signed)
 Copied from CRM #[id number]. Topic: Clinical - Medication Question >> Mar 29, 2024  2:08 PM Essie A wrote: Reason for CRM: [facility] called to ask if patient, who is currently on doxycycline  for pneumonia, can still have a CT scan.  Wants to know if she should reschedule.  Please return her call at [phone].  Thanks.   Called and spoke with [facility] from [place] and advised it was fine to continue on with the CT scan so Dr. Shelah can review. NFN

## 2024-04-01 ENCOUNTER — Other Ambulatory Visit

## 2024-04-01 ENCOUNTER — Ambulatory Visit: Admitting: Emergency Medicine

## 2024-04-01 VITALS — BP 110/70 | HR 60

## 2024-04-01 DIAGNOSIS — J439 Emphysema, unspecified: Secondary | ICD-10-CM

## 2024-04-01 DIAGNOSIS — J449 Chronic obstructive pulmonary disease, unspecified: Secondary | ICD-10-CM

## 2024-04-01 DIAGNOSIS — R9389 Abnormal findings on diagnostic imaging of other specified body structures: Secondary | ICD-10-CM

## 2024-04-01 DIAGNOSIS — R918 Other nonspecific abnormal finding of lung field: Secondary | ICD-10-CM | POA: Diagnosis not present

## 2024-04-01 NOTE — Assessment & Plan Note (Signed)
 We have been following an abnormal CT with lymphadenopathy, emphysema, scattered scar from pneumonias including an ovoid opacity 2.2 x 1.2 cm in the right lower lobe consistent with rounded atelectasis.  Her next CT is scheduled for 04/12/2024.  We will get her back in to review that scan once it is available.  Acknowledged that she is being treated for pneumonia now and we may see some evidence for acute infection on the upcoming CT chest

## 2024-04-01 NOTE — Patient Instructions (Addendum)
 Please complete your doxycycline  prescription as written until completely gone Continue Stiolto 2 puffs once daily Keep your albuterol  available to use 2 puffs if needed for shortness of breath, chest tightness, wheezing. Get your CT scan of the chest/abdomen/pelvis on 12/29 as planned Continue your oxygen  at 2-3 L/min depending on your level of exertion Follow Dr. Shelah in 1 month to review imaging

## 2024-04-01 NOTE — Assessment & Plan Note (Signed)
 With an acute exacerbation and probable community-acquired pneumonia.  She is on doxycycline .  No wheezing on exam today, remains very distant.  Minimal cough.  Have instructed her to complete the antibiotic course.  We will continue Stiolto as ordered.  If she begins to flare frequently we may need to add an ICS.

## 2024-04-01 NOTE — Progress Notes (Signed)
 Subjective:    Patient ID: Beverly Li, female    DOB: 24-Aug-1945, 77 y.o.   MRN: [id number]  HPI  ROV 10/01/2023 --78 year old woman with severe COPD and chronic hypoxemic respiratory failure.  She had a right lower lobe pneumonia in March 2025. PMH: Atrial fibrillation, HOCM, PVD, CVA with associated dysphagia, aortic dilation She has been experiencing some increased dyspnea when she lays down to go to bed, fairly longstanding. She has some intermittent aspiration and cough depending on her food. Not really dealing with a lot of mucous. Managed on Stiolto, rare albuterol  use.   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  Heterogeneous 1.9 cm nodule of the left lobe of the thyroid  (nonemergent ultrasound recommended).  Severe panlobular emphysema that is upper lobe predominant right lower lobe volume loss with associated bronchiectasis, suspect progressive scarring, slowly more notable going back to 2022.  Posterior right lower lobe ovoid opacity 2.2 x 1.2 cm that is stable compared with March 2025.  5 mm posterior left lower lobe nodule unchanged over multiple scans.  ROV 04/01/2024 --78 year old woman whom I follow for severe COPD and associated chronic hypoxemic respiratory failure.  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  She has had a right lower lobe pneumonia and we have been following serial CT scans for an area of apparent rounded atelectasis.  Scan also significant for some panlobular emphysema and bronchiectasis. Currently managed on Stiolto, DuoNeb as needed, albuterol  as needed.  She was treated 12/11 for possible pneumonia.  This was associated with emesis, constitutional symptoms.  She had a COVID and flu swab that were negative at that time.   Started on doxycycline .  She is due for a CT chest on 12/29. She still feels very fatigued. No real cough or congestion. Her O2 was titrated up to 4L/min but now back down to 2-3L/min which is baseline. She does work with PT.    Review of Systems  Constitutional:  Negative for fever and unexpected weight change.  HENT:  Negative for congestion, dental problem, ear pain, nosebleeds, postnasal drip, rhinorrhea, sinus pressure, sneezing, sore throat and trouble swallowing.   Eyes:  Negative for redness and itching.  Respiratory:  Positive for chest tightness and shortness of breath. Negative for cough and wheezing.   Cardiovascular:  Negative for palpitations and leg swelling.  Gastrointestinal:  Negative for nausea and vomiting.  Genitourinary:  Negative for dysuria.  Musculoskeletal:  Negative for joint swelling.  Skin:  Negative for rash.  Neurological:  Negative for headaches.  Hematological:  Does not bruise/bleed easily.  Psychiatric/Behavioral:  Negative for dysphoric mood. The patient is not nervous/anxious.        Objective:   Physical Exam Vitals:   04/01/24 1549  BP: 110/70  Pulse: 60  SpO2: 97%     Gen: Pleasant, thin woman, in no distress,  normal affect  ENT: No lesions,  mouth clear,  oropharynx clear, no postnasal drip  Neck: No JVD, no stridor  Lungs: No use of accessory muscles, clear on normal respiration.  Crackles at both bases, no wheezing  Cardiovascular: RRR, heart sounds normal, no murmur or gallops, no peripheral edema  Musculoskeletal: No deformities, no cyanosis or clubbing  Neuro: awake, some dysarthria, subtle facial droop.   Skin: Warm, no lesions or rashes  Assessment & Plan:  COPD (chronic obstructive pulmonary disease) (HCC) With an acute exacerbation and probable community-acquired pneumonia.  She is on doxycycline .  No wheezing on exam today, remains very distant.  Minimal cough.  Have instructed her to complete the antibiotic  course.  We will continue Stiolto as ordered.  If she begins to flare frequently we may need to add an ICS.  Abnormal CT of the chest We have been following an abnormal CT with lymphadenopathy, emphysema, scattered scar from pneumonias including an ovoid opacity 2.2 x 1.2 cm in the right lower lobe consistent with rounded atelectasis.  Her next CT is scheduled for 04/12/2024.  We will get her back in to review that scan once it is available.  Acknowledged that she is being treated for pneumonia now and we may see some evidence for acute infection on the upcoming CT chest   I personally spent a total of 30 minutes in the care of the patient today including preparing to see the patient, getting/reviewing separately obtained history, performing a medically appropriate exam/evaluation, counseling and educating, documenting clinical information in the EHR, independently interpreting results, and communicating results.   Lamar Chris, MD, PhD 04/01/2024, 4:33 PM [facility] [phone] or if no answer [phone]

## 2024-04-05 ENCOUNTER — Telehealth: Payer: Self-pay | Admitting: Cardiology

## 2024-04-05 NOTE — Telephone Encounter (Signed)
 Spoke with Misty at [facility] and pt regarding pt's medications. Misty stated that they want to check with Dr. Jeffrie to make sure it is ok to hold both the amiodarone  and hydralazine  for a systolic blood pressure of 100. Misty and pt were told the question would be sent to Dr. Jeffrie. Misty and pt verbalized understanding. All questions if any were answered.

## 2024-04-05 NOTE — Telephone Encounter (Signed)
 Pt c/o medication issue:  1. Name of Medication:   hydrALAZINE  (APRESOLINE ) 10 MG tablet   2. How are you currently taking this medication (dosage and times per day)?   3. Are you having a reaction (difficulty breathing--STAT)?   4. What is your medication issue?   Caller [facility]) called to report patient's medication was changed to hold for a systolic BP less than 100.

## 2024-04-06 NOTE — Telephone Encounter (Signed)
 Beverly Li aware - OK per Dr Jeffrie. She had no further questions or concerns at the time of the call.

## 2024-04-12 ENCOUNTER — Ambulatory Visit
Admission: RE | Admit: 2024-04-12 | Discharge: 2024-04-12 | Disposition: A | Discharge status: Home / Self Care | Source: Ambulatory Visit | Attending: Urology | Admitting: Urology

## 2024-04-12 ENCOUNTER — Other Ambulatory Visit

## 2024-04-12 ENCOUNTER — Ambulatory Visit
Admission: RE | Admit: 2024-04-12 | Discharge: 2024-04-12 | Disposition: A | Source: Ambulatory Visit | Attending: Emergency Medicine | Admitting: Emergency Medicine

## 2024-04-12 DIAGNOSIS — R9389 Abnormal findings on diagnostic imaging of other specified body structures: Secondary | ICD-10-CM

## 2024-04-12 DIAGNOSIS — R310 Gross hematuria: Secondary | ICD-10-CM

## 2024-04-12 DIAGNOSIS — N3946 Mixed incontinence: Secondary | ICD-10-CM

## 2024-04-12 DIAGNOSIS — R350 Frequency of micturition: Secondary | ICD-10-CM

## 2024-04-12 MED ORDER — IOPAMIDOL (ISOVUE-300) INJECTION 61%
100.0000 mL | Freq: Once | INTRAVENOUS | Status: AC | PRN
  Administered 2024-04-12: 100 mL via INTRAVENOUS

## 2024-04-15 ENCOUNTER — Emergency Department (HOSPITAL_COMMUNITY)

## 2024-04-15 ENCOUNTER — Inpatient Hospital Stay (HOSPITAL_COMMUNITY)
Admission: EM | Admit: 2024-04-15 | Discharge: 2024-04-17 | DRG: 189 | Disposition: A | Discharge status: Skilled Nursing Facility | Source: Skilled Nursing Facility | Attending: Internal Medicine | Admitting: Internal Medicine

## 2024-04-15 ENCOUNTER — Other Ambulatory Visit: Payer: Self-pay

## 2024-04-15 DIAGNOSIS — Z9981 Dependence on supplemental oxygen: Secondary | ICD-10-CM

## 2024-04-15 DIAGNOSIS — Z79899 Other long term (current) drug therapy: Secondary | ICD-10-CM

## 2024-04-15 DIAGNOSIS — I739 Peripheral vascular disease, unspecified: Secondary | ICD-10-CM | POA: Diagnosis present

## 2024-04-15 DIAGNOSIS — I480 Paroxysmal atrial fibrillation: Secondary | ICD-10-CM | POA: Diagnosis present

## 2024-04-15 DIAGNOSIS — Z801 Family history of malignant neoplasm of trachea, bronchus and lung: Secondary | ICD-10-CM

## 2024-04-15 DIAGNOSIS — Z66 Do not resuscitate: Secondary | ICD-10-CM | POA: Diagnosis present

## 2024-04-15 DIAGNOSIS — J9621 Acute and chronic respiratory failure with hypoxia: Principal | ICD-10-CM | POA: Diagnosis present

## 2024-04-15 DIAGNOSIS — I2510 Atherosclerotic heart disease of native coronary artery without angina pectoris: Secondary | ICD-10-CM | POA: Diagnosis present

## 2024-04-15 DIAGNOSIS — J42 Unspecified chronic bronchitis: Secondary | ICD-10-CM

## 2024-04-15 DIAGNOSIS — G8191 Hemiplegia, unspecified affecting right dominant side: Secondary | ICD-10-CM | POA: Diagnosis present

## 2024-04-15 DIAGNOSIS — Z8249 Family history of ischemic heart disease and other diseases of the circulatory system: Secondary | ICD-10-CM

## 2024-04-15 DIAGNOSIS — J441 Chronic obstructive pulmonary disease with (acute) exacerbation: Secondary | ICD-10-CM | POA: Diagnosis present

## 2024-04-15 DIAGNOSIS — E039 Hypothyroidism, unspecified: Secondary | ICD-10-CM | POA: Diagnosis present

## 2024-04-15 DIAGNOSIS — I110 Hypertensive heart disease with heart failure: Secondary | ICD-10-CM | POA: Diagnosis present

## 2024-04-15 DIAGNOSIS — Z981 Arthrodesis status: Secondary | ICD-10-CM

## 2024-04-15 DIAGNOSIS — Z7901 Long term (current) use of anticoagulants: Secondary | ICD-10-CM

## 2024-04-15 DIAGNOSIS — Z87442 Personal history of urinary calculi: Secondary | ICD-10-CM

## 2024-04-15 DIAGNOSIS — Z87891 Personal history of nicotine dependence: Secondary | ICD-10-CM

## 2024-04-15 DIAGNOSIS — I509 Heart failure, unspecified: Principal | ICD-10-CM

## 2024-04-15 DIAGNOSIS — Z803 Family history of malignant neoplasm of breast: Secondary | ICD-10-CM

## 2024-04-15 DIAGNOSIS — I5033 Acute on chronic diastolic (congestive) heart failure: Secondary | ICD-10-CM | POA: Diagnosis present

## 2024-04-15 DIAGNOSIS — I69322 Dysarthria following cerebral infarction: Secondary | ICD-10-CM

## 2024-04-15 DIAGNOSIS — R072 Precordial pain: Secondary | ICD-10-CM | POA: Diagnosis present

## 2024-04-15 DIAGNOSIS — K219 Gastro-esophageal reflux disease without esophagitis: Secondary | ICD-10-CM | POA: Diagnosis present

## 2024-04-15 DIAGNOSIS — I10 Essential (primary) hypertension: Secondary | ICD-10-CM | POA: Diagnosis present

## 2024-04-15 DIAGNOSIS — I421 Obstructive hypertrophic cardiomyopathy: Secondary | ICD-10-CM | POA: Diagnosis present

## 2024-04-15 DIAGNOSIS — J9611 Chronic respiratory failure with hypoxia: Secondary | ICD-10-CM

## 2024-04-15 DIAGNOSIS — F32A Depression, unspecified: Secondary | ICD-10-CM | POA: Diagnosis present

## 2024-04-15 DIAGNOSIS — Z800 Family history of malignant neoplasm of digestive organs: Secondary | ICD-10-CM

## 2024-04-15 MED ORDER — METHYLPREDNISOLONE SODIUM SUCC 125 MG IJ SOLR
125.0000 mg | Freq: Once | INTRAMUSCULAR | Status: AC
  Administered 2024-04-15: 125 mg via INTRAVENOUS
  Filled 2024-04-15: qty 2

## 2024-04-15 MED ORDER — IPRATROPIUM-ALBUTEROL 0.5-2.5 (3) MG/3ML IN SOLN
3.0000 mL | Freq: Once | RESPIRATORY_TRACT | Status: AC
  Administered 2024-04-15: 3 mL via RESPIRATORY_TRACT
  Filled 2024-04-15: qty 3

## 2024-04-15 NOTE — ED Triage Notes (Signed)
 Pt BIB [facility] coming from [facility] for concerns that patient is in Afib, and unable to keep O2 saturations up on baseline of 2L. Hx CHF, COPD  EMS: 112/70 80-140 HR 94% 4L 157 CBG 20g L AC

## 2024-04-15 NOTE — ED Provider Notes (Signed)
 " [place] EMERGENCY DEPARTMENT AT [facility] Provider Note   CSN: [id number] Arrival date & time: 04/15/24  2326     Patient presents with: Shortness of Breath   Beverly Li is a 79 y.o. female.  {Add pertinent medical, surgical, social history, OB history to YEP:67052} The history is provided by the patient and medical records.  Shortness of Breath  79 y.o. F with hx of HTN, prior stroke with residual right-sided deficits, history of A-fib on Eliquis , GERD, hyperlipidemia, osteopenia, presenting to the ED from facility due to SOB.  Today patient was noted to have increased O2 requirements- generally on 2L but today sats would not go over 88% on her baseline O2.  EMS bumped up to 4L with some improvement.  She denies complaints of chest pain or palpitations.  Does feels a little SOB.  She denies cough or fever.  No reported sick contacts.  Prior to Admission medications  Medication Sig Start Date End Date Taking? Authorizing Provider  acetaminophen  (TYLENOL ) 325 MG tablet Take 650 mg by mouth every 4 (four) hours as needed.    [provider]  albuterol  (VENTOLIN  HFA) 108 (90 Base) MCG/ACT inhaler Inhale 2 puffs into the lungs every 4 (four) hours as needed for wheezing or shortness of breath. 10/08/23   Fargo, Amy E, NP  amiodarone  (PACERONE ) 100 MG tablet Take 100 mg by mouth daily.    [provider]  amLODipine  (NORVASC ) 2.5 MG tablet Take 2.5 mg by mouth. Give 5 mg in AM and 2.5 mg in PM    [provider]  apixaban  (ELIQUIS ) 5 MG TABS tablet Take 5 mg by mouth 2 (two) times daily. May give crushed    [provider]  Benzonatate  (TESSALON  PERLES PO) Take 200 mg by mouth as needed. 200mg , oral as needed, Tessalon  Perles, 200mg , PO TID PRN cough    [provider]  buPROPion  (WELLBUTRIN  XL) 150 MG 24 hr tablet Take 150 mg by mouth daily.    [provider]  Calcium -Vitamin D -Vitamin K (VIACTIV CALCIUM  PLUS D)  650-12.5-40 MG-MCG-MCG CHEW Chew 1 tablet by mouth daily.    [provider]  Cholecalciferol (VITAMIN D3) 20 MCG (800 UNIT) TABS Take 1 tablet by mouth daily.    [provider]  cloNIDine (CATAPRES) 0.1 MG tablet Take 0.1 mg by mouth daily as needed.    [provider]  denosumab (PROLIA) 60 MG/ML SOSY injection Inject 60 mg into the skin every 6 (six) months.    [provider]  diclofenac Sodium (VOLTAREN) 1 % GEL Apply 4 g topically every 6 (six) hours as needed. Apply one application to right hip and knee topically.    [provider]  doxycycline  (VIBRAMYCIN ) 100 MG capsule Take 100 mg by mouth 2 (two) times daily. 03/26/24   [provider]  Emollient (CERAVE) CREA Apply 1 Application topically as needed.    [provider]  Eyelid Cleansers (OCUSOFT EYELID CLEANSING) PADS Apply 1 Application topically daily. 07/04/23   Darlean Maus, NP  ezetimibe  (ZETIA ) 10 MG tablet Take 10 mg by mouth daily. Oral if crushed    [provider]  furosemide  (LASIX ) 20 MG tablet Take 20 mg by mouth daily as needed. Give Lasix  20mg  for weight gain equal or greater than 3 pounds    [provider]  hydrALAZINE  (APRESOLINE ) 10 MG tablet Take 10 mg by mouth 3 (three) times daily as needed (SBP > 165 or DBP >  95).    [provider]  hydrALAZINE  (APRESOLINE ) 10 MG tablet Take 2 tablets (20 mg total) by mouth 3 (three) times daily. 03/15/24   Gupta, [name], MD  hydrocortisone cream 1 % Apply 1 Application topically 2 (two) times daily as needed for itching.    [provider]  ibuprofen (ADVIL) 600 MG tablet Take 600 mg by mouth. Give 600 mg by mouth every 03 hours as needed for For pain following oral surgery Alternate Ibuprofen 600 mg PO q3h with Tylenol  500 mg PO    [provider]  ipratropium-albuterol  (DUONEB) 0.5-2.5 (3) MG/3ML SOLN Inhale 3 mLs into the lungs every 6 (six) hours as needed.     [provider]  ketoconazole  (NIZORAL ) 2 % cream Apply 1 application  topically every 12 (twelve) hours as needed for irritation.    [provider]  mirtazapine  (REMERON ) 7.5 MG tablet Take 1 tablet (7.5 mg total) by mouth at bedtime. 03/22/24   Darlean Maus, NP  ondansetron  (ZOFRAN ) 4 MG tablet Take 4 mg by mouth every 6 (six) hours as needed for nausea or vomiting.    [provider]  OXYGEN  Inhale 3 L into the lungs as directed. to maintain sats > or equal to 90%. May titrate Three Times A Day; 07:00 AM - 03:00 PM, 03:00 PM - 11:00 PM, 11:00 PM - 7:00 am    [provider]  pantoprazole  (PROTONIX ) 40 MG tablet Take 40 mg by mouth 2 (two) times daily.    [provider]  Polyethyl Glycol-Propyl Glycol (SYSTANE) 0.4-0.3 % SOLN Apply 2 drops to eye every 6 (six) hours as needed (dry eyes).    [provider]  polyethylene glycol powder (GLYCOLAX /MIRALAX ) 17 GM/SCOOP powder Take 17 g by mouth every other day. 05/31/23   Darlean Maus, NP  sennosides-docusate sodium  (SENOKOT-S) 8.6-50 MG tablet Take 2 tablets by mouth 2 (two) times daily.    [provider]  Tiotropium Bromide-Olodaterol (STIOLTO RESPIMAT ) 2.5-2.5 MCG/ACT AERS Inhale 2 puffs into the lungs daily.    [provider]    Allergies: Amoxicillin, Atenolol, Crestor  [rosuvastatin  calcium ], Pravastatin , Rosuvastatin  calcium , Sulfa antibiotics, and Codeine    Review of Systems  Respiratory:  Positive for shortness of breath.   All other systems reviewed and are negative.   Updated Vital Signs BP 116/70   Pulse (!) 107   Resp (!) 21   Ht 5' 5 (1.651 m)   Wt 41.3 kg   LMP  (LMP Unknown)   SpO2 90%   BMI 15.15 kg/m   Physical Exam Vitals and nursing note reviewed.  Constitutional:      Appearance: She is well-developed.  HENT:     Head: Normocephalic and atraumatic.  Eyes:     Conjunctiva/sclera: Conjunctivae normal.     Pupils: Pupils are equal,  round, and reactive to light.  Cardiovascular:     Rate and Rhythm: Normal rate and regular rhythm.     Heart sounds: Normal heart sounds.  Pulmonary:     Effort: Pulmonary effort is normal.     Breath sounds: Wheezing present.     Comments: Some increased WOB but not acutely distressed, does have end expiratory wheezes Abdominal:     General: Bowel sounds are normal.     Palpations: Abdomen is soft.  Musculoskeletal:        General: Normal range of motion.     Cervical back: Normal range of motion.  Skin:    General:  Skin is warm and dry.  Neurological:     Mental Status: She is alert and oriented to person, place, and time.     (all labs ordered are listed, but only abnormal results are displayed) Labs Reviewed  CBC WITH DIFFERENTIAL/PLATELET  COMPREHENSIVE METABOLIC PANEL WITH GFR  PRO BRAIN NATRIURETIC PEPTIDE  TROPONIN T, HIGH SENSITIVITY    EKG: None  Radiology: No results found.  {Document cardiac monitor, telemetry assessment procedure when appropriate:32947} Procedures   Medications Ordered in the ED  methylPREDNISolone  sodium succinate (SOLU-MEDROL ) 125 mg/2 mL injection 125 mg (has no administration in time range)  ipratropium-albuterol  (DUONEB) 0.5-2.5 (3) MG/3ML nebulizer solution 3 mL (has no administration in time range)      {Click here for ABCD2, HEART and other calculators REFRESH Note before signing:1}                              Medical Decision Making Amount and/or Complexity of Data Reviewed Labs: ordered. Radiology: ordered. ECG/medicine tests: ordered.  Risk Prescription drug management.   ***  {Document critical care time when appropriate  Document review of labs and clinical decision tools ie CHADS2VASC2, etc  Document your independent review of radiology images and any outside records  Document your discussion with family members, caretakers and with consultants  Document social determinants of health affecting pt's care   Document your decision making why or why not admission, treatments were needed:32947:::1}   Final diagnoses:  None    ED Discharge Orders     None        "

## 2024-04-16 ENCOUNTER — Encounter (HOSPITAL_COMMUNITY): Payer: Self-pay | Admitting: Family Medicine

## 2024-04-16 ENCOUNTER — Inpatient Hospital Stay (HOSPITAL_COMMUNITY)

## 2024-04-16 ENCOUNTER — Emergency Department (HOSPITAL_COMMUNITY)

## 2024-04-16 DIAGNOSIS — J471 Bronchiectasis with (acute) exacerbation: Secondary | ICD-10-CM

## 2024-04-16 DIAGNOSIS — J9621 Acute and chronic respiratory failure with hypoxia: Principal | ICD-10-CM

## 2024-04-16 DIAGNOSIS — J9611 Chronic respiratory failure with hypoxia: Secondary | ICD-10-CM | POA: Diagnosis present

## 2024-04-16 DIAGNOSIS — I739 Peripheral vascular disease, unspecified: Secondary | ICD-10-CM | POA: Diagnosis present

## 2024-04-16 DIAGNOSIS — I110 Hypertensive heart disease with heart failure: Secondary | ICD-10-CM | POA: Diagnosis present

## 2024-04-16 DIAGNOSIS — I5031 Acute diastolic (congestive) heart failure: Secondary | ICD-10-CM

## 2024-04-16 DIAGNOSIS — J441 Chronic obstructive pulmonary disease with (acute) exacerbation: Secondary | ICD-10-CM | POA: Diagnosis present

## 2024-04-16 DIAGNOSIS — Z7901 Long term (current) use of anticoagulants: Secondary | ICD-10-CM | POA: Diagnosis not present

## 2024-04-16 DIAGNOSIS — F32A Depression, unspecified: Secondary | ICD-10-CM | POA: Insufficient documentation

## 2024-04-16 DIAGNOSIS — Z87442 Personal history of urinary calculi: Secondary | ICD-10-CM | POA: Diagnosis not present

## 2024-04-16 DIAGNOSIS — E039 Hypothyroidism, unspecified: Secondary | ICD-10-CM | POA: Diagnosis present

## 2024-04-16 DIAGNOSIS — I480 Paroxysmal atrial fibrillation: Secondary | ICD-10-CM

## 2024-04-16 DIAGNOSIS — K219 Gastro-esophageal reflux disease without esophagitis: Secondary | ICD-10-CM | POA: Insufficient documentation

## 2024-04-16 DIAGNOSIS — Z8249 Family history of ischemic heart disease and other diseases of the circulatory system: Secondary | ICD-10-CM | POA: Diagnosis not present

## 2024-04-16 DIAGNOSIS — I5033 Acute on chronic diastolic (congestive) heart failure: Secondary | ICD-10-CM | POA: Diagnosis present

## 2024-04-16 DIAGNOSIS — Z803 Family history of malignant neoplasm of breast: Secondary | ICD-10-CM | POA: Diagnosis not present

## 2024-04-16 DIAGNOSIS — Z800 Family history of malignant neoplasm of digestive organs: Secondary | ICD-10-CM | POA: Diagnosis not present

## 2024-04-16 DIAGNOSIS — I69322 Dysarthria following cerebral infarction: Secondary | ICD-10-CM | POA: Diagnosis not present

## 2024-04-16 DIAGNOSIS — I421 Obstructive hypertrophic cardiomyopathy: Secondary | ICD-10-CM | POA: Diagnosis present

## 2024-04-16 DIAGNOSIS — Z9981 Dependence on supplemental oxygen: Secondary | ICD-10-CM | POA: Diagnosis not present

## 2024-04-16 DIAGNOSIS — Z801 Family history of malignant neoplasm of trachea, bronchus and lung: Secondary | ICD-10-CM | POA: Diagnosis not present

## 2024-04-16 DIAGNOSIS — Z87891 Personal history of nicotine dependence: Secondary | ICD-10-CM | POA: Diagnosis not present

## 2024-04-16 DIAGNOSIS — Z66 Do not resuscitate: Secondary | ICD-10-CM | POA: Diagnosis present

## 2024-04-16 DIAGNOSIS — Z981 Arthrodesis status: Secondary | ICD-10-CM | POA: Diagnosis not present

## 2024-04-16 DIAGNOSIS — I2510 Atherosclerotic heart disease of native coronary artery without angina pectoris: Secondary | ICD-10-CM | POA: Diagnosis present

## 2024-04-16 DIAGNOSIS — G8191 Hemiplegia, unspecified affecting right dominant side: Secondary | ICD-10-CM | POA: Diagnosis present

## 2024-04-16 DIAGNOSIS — R072 Precordial pain: Secondary | ICD-10-CM | POA: Diagnosis present

## 2024-04-16 LAB — CBC
HCT: 41.4 % (ref 36.0–46.0)
Hemoglobin: 13.1 g/dL (ref 12.0–15.0)
MCH: 30.2 pg (ref 26.0–34.0)
MCHC: 31.6 g/dL (ref 30.0–36.0)
MCV: 95.4 fL (ref 80.0–100.0)
Platelets: 269 K/uL (ref 150–400)
RBC: 4.34 MIL/uL (ref 3.87–5.11)
RDW: 15.7 % — ABNORMAL HIGH (ref 11.5–15.5)
WBC: 7.6 K/uL (ref 4.0–10.5)
nRBC: 0 % (ref 0.0–0.2)

## 2024-04-16 LAB — ECHOCARDIOGRAM COMPLETE
AR max vel: 3.11 cm2
AV Area VTI: 3.01 cm2
AV Area mean vel: 2.91 cm2
AV Mean grad: 6 mmHg
AV Peak grad: 12.5 mmHg
Ao pk vel: 1.77 m/s
Calc EF: 62.8 %
Height: 65 in
MV VTI: 3.82 cm2
S' Lateral: 2.6 cm
Single Plane A2C EF: 66 %
Single Plane A4C EF: 60.3 %
Weight: 1456.8 [oz_av]

## 2024-04-16 LAB — CBC WITH DIFFERENTIAL/PLATELET
Abs Immature Granulocytes: 0.03 K/uL (ref 0.00–0.07)
Basophils Absolute: 0 K/uL (ref 0.0–0.1)
Basophils Relative: 0 %
Eosinophils Absolute: 0.1 K/uL (ref 0.0–0.5)
Eosinophils Relative: 1 %
HCT: 44.3 % (ref 36.0–46.0)
Hemoglobin: 13.9 g/dL (ref 12.0–15.0)
Immature Granulocytes: 0 %
Lymphocytes Relative: 17 %
Lymphs Abs: 1.5 K/uL (ref 0.7–4.0)
MCH: 30.3 pg (ref 26.0–34.0)
MCHC: 31.4 g/dL (ref 30.0–36.0)
MCV: 96.5 fL (ref 80.0–100.0)
Monocytes Absolute: 0.7 K/uL (ref 0.1–1.0)
Monocytes Relative: 8 %
Neutro Abs: 6.8 K/uL (ref 1.7–7.7)
Neutrophils Relative %: 74 %
Platelets: 270 K/uL (ref 150–400)
RBC: 4.59 MIL/uL (ref 3.87–5.11)
RDW: 15.8 % — ABNORMAL HIGH (ref 11.5–15.5)
WBC: 9.2 K/uL (ref 4.0–10.5)
nRBC: 0 % (ref 0.0–0.2)

## 2024-04-16 LAB — COMPREHENSIVE METABOLIC PANEL WITH GFR
ALT: 13 U/L (ref 0–44)
AST: 27 U/L (ref 15–41)
Albumin: 3.9 g/dL (ref 3.5–5.0)
Alkaline Phosphatase: 75 U/L (ref 38–126)
Anion gap: 12 (ref 5–15)
BUN: 31 mg/dL — ABNORMAL HIGH (ref 8–23)
CO2: 28 mmol/L (ref 22–32)
Calcium: 8.9 mg/dL (ref 8.9–10.3)
Chloride: 103 mmol/L (ref 98–111)
Creatinine, Ser: 1.24 mg/dL — ABNORMAL HIGH (ref 0.44–1.00)
GFR, Estimated: 44 mL/min — ABNORMAL LOW
Glucose, Bld: 102 mg/dL — ABNORMAL HIGH (ref 70–99)
Potassium: 4.1 mmol/L (ref 3.5–5.1)
Sodium: 143 mmol/L (ref 135–145)
Total Bilirubin: 0.4 mg/dL (ref 0.0–1.2)
Total Protein: 6.9 g/dL (ref 6.5–8.1)

## 2024-04-16 LAB — TROPONIN T, HIGH SENSITIVITY
Troponin T High Sensitivity: 63 ng/L — ABNORMAL HIGH (ref 0–19)
Troponin T High Sensitivity: 69 ng/L — ABNORMAL HIGH (ref 0–19)
Troponin T High Sensitivity: 70 ng/L — ABNORMAL HIGH (ref 0–19)

## 2024-04-16 LAB — BASIC METABOLIC PANEL WITH GFR
Anion gap: 14 (ref 5–15)
BUN: 34 mg/dL — ABNORMAL HIGH (ref 8–23)
CO2: 27 mmol/L (ref 22–32)
Calcium: 8.5 mg/dL — ABNORMAL LOW (ref 8.9–10.3)
Chloride: 103 mmol/L (ref 98–111)
Creatinine, Ser: 1.23 mg/dL — ABNORMAL HIGH (ref 0.44–1.00)
GFR, Estimated: 45 mL/min — ABNORMAL LOW
Glucose, Bld: 123 mg/dL — ABNORMAL HIGH (ref 70–99)
Potassium: 4.3 mmol/L (ref 3.5–5.1)
Sodium: 144 mmol/L (ref 135–145)

## 2024-04-16 LAB — MRSA NEXT GEN BY PCR, NASAL: MRSA by PCR Next Gen: NOT DETECTED

## 2024-04-16 LAB — PRO BRAIN NATRIURETIC PEPTIDE: Pro Brain Natriuretic Peptide: 2700 pg/mL — ABNORMAL HIGH

## 2024-04-16 MED ORDER — SENNOSIDES-DOCUSATE SODIUM 8.6-50 MG PO TABS
2.0000 | ORAL_TABLET | Freq: Two times a day (BID) | ORAL | Status: DC
  Filled 2024-04-16 (×3): qty 2

## 2024-04-16 MED ORDER — HYDROCOD POLI-CHLORPHE POLI ER 10-8 MG/5ML PO SUER: 5.0000 mL | Freq: Two times a day (BID) | ORAL | Status: DC | PRN

## 2024-04-16 MED ORDER — FUROSEMIDE 10 MG/ML IJ SOLN
40.0000 mg | INTRAMUSCULAR | Status: AC
  Administered 2024-04-16: 40 mg via INTRAVENOUS
  Filled 2024-04-16: qty 4

## 2024-04-16 MED ORDER — CLONIDINE HCL 0.1 MG PO TABS: 0.1000 mg | ORAL_TABLET | Freq: Every day | ORAL | Status: DC | PRN

## 2024-04-16 MED ORDER — BUPROPION HCL ER (XL) 150 MG PO TB24
150.0000 mg | ORAL_TABLET | Freq: Every day | ORAL | Status: DC
  Administered 2024-04-16 – 2024-04-17 (×2): 150 mg via ORAL
  Filled 2024-04-16 (×2): qty 1

## 2024-04-16 MED ORDER — MAGNESIUM HYDROXIDE 400 MG/5ML PO SUSP: 30.0000 mL | Freq: Every day | ORAL | Status: DC | PRN

## 2024-04-16 MED ORDER — FUROSEMIDE 10 MG/ML IJ SOLN
40.0000 mg | Freq: Two times a day (BID) | INTRAMUSCULAR | Status: DC
  Administered 2024-04-16 – 2024-04-17 (×3): 40 mg via INTRAVENOUS
  Filled 2024-04-16 (×4): qty 4

## 2024-04-16 MED ORDER — FUROSEMIDE 20 MG PO TABS: 20.0000 mg | ORAL_TABLET | Freq: Every day | ORAL | Status: DC | PRN

## 2024-04-16 MED ORDER — HYDRALAZINE HCL 10 MG PO TABS: 10.0000 mg | ORAL_TABLET | Freq: Every day | ORAL | Status: DC | PRN

## 2024-04-16 MED ORDER — ACETAMINOPHEN 650 MG RE SUPP: 650.0000 mg | Freq: Four times a day (QID) | RECTAL | Status: DC | PRN

## 2024-04-16 MED ORDER — IPRATROPIUM-ALBUTEROL 0.5-2.5 (3) MG/3ML IN SOLN
3.0000 mL | Freq: Four times a day (QID) | RESPIRATORY_TRACT | Status: DC
  Administered 2024-04-16 – 2024-04-17 (×3): 3 mL via RESPIRATORY_TRACT
  Filled 2024-04-16 (×5): qty 3

## 2024-04-16 MED ORDER — HYDRALAZINE HCL 10 MG PO TABS
20.0000 mg | ORAL_TABLET | Freq: Three times a day (TID) | ORAL | Status: DC
  Administered 2024-04-16 – 2024-04-17 (×4): 20 mg via ORAL
  Filled 2024-04-16 (×4): qty 2

## 2024-04-16 MED ORDER — APIXABAN 5 MG PO TABS
5.0000 mg | ORAL_TABLET | Freq: Two times a day (BID) | ORAL | Status: DC
  Administered 2024-04-16 – 2024-04-17 (×3): 5 mg via ORAL
  Filled 2024-04-16 (×3): qty 1

## 2024-04-16 MED ORDER — AMIODARONE HCL 100 MG PO TABS
100.0000 mg | ORAL_TABLET | Freq: Every day | ORAL | Status: DC
  Administered 2024-04-16 – 2024-04-17 (×2): 100 mg via ORAL
  Filled 2024-04-16 (×2): qty 1

## 2024-04-16 MED ORDER — GUAIFENESIN ER 600 MG PO TB12
600.0000 mg | ORAL_TABLET | Freq: Two times a day (BID) | ORAL | Status: DC
  Filled 2024-04-16 (×4): qty 1

## 2024-04-16 MED ORDER — EZETIMIBE 10 MG PO TABS
10.0000 mg | ORAL_TABLET | Freq: Every day | ORAL | Status: DC
  Administered 2024-04-16 – 2024-04-17 (×2): 10 mg via ORAL
  Filled 2024-04-16 (×2): qty 1

## 2024-04-16 MED ORDER — MIRTAZAPINE 15 MG PO TABS
7.5000 mg | ORAL_TABLET | Freq: Every day | ORAL | Status: DC
  Administered 2024-04-16: 7.5 mg via ORAL
  Filled 2024-04-16: qty 1

## 2024-04-16 MED ORDER — TRAZODONE HCL 50 MG PO TABS: 25.0000 mg | ORAL_TABLET | Freq: Every evening | ORAL | Status: DC | PRN

## 2024-04-16 MED ORDER — ACETAMINOPHEN 325 MG PO TABS: 650.0000 mg | ORAL_TABLET | Freq: Four times a day (QID) | ORAL | Status: DC | PRN

## 2024-04-16 MED ORDER — AMLODIPINE BESYLATE 2.5 MG PO TABS
2.5000 mg | ORAL_TABLET | Freq: Every day | ORAL | Status: DC
  Administered 2024-04-16 – 2024-04-17 (×2): 2.5 mg via ORAL
  Filled 2024-04-16 (×2): qty 1

## 2024-04-16 MED ORDER — VITAMIN D 25 MCG (1000 UNIT) PO TABS
1000.0000 [IU] | ORAL_TABLET | Freq: Every day | ORAL | Status: DC
  Administered 2024-04-16 – 2024-04-17 (×2): 1000 [IU] via ORAL
  Filled 2024-04-16 (×2): qty 1

## 2024-04-16 MED ORDER — ENOXAPARIN SODIUM 30 MG/0.3ML IJ SOSY: 30.0000 mg | PREFILLED_SYRINGE | INTRAMUSCULAR | Status: DC

## 2024-04-16 MED ORDER — SODIUM CHLORIDE 0.9 % IV SOLN
1.0000 g | INTRAVENOUS | Status: DC
  Administered 2024-04-16 – 2024-04-17 (×2): 1 g via INTRAVENOUS
  Filled 2024-04-16 (×2): qty 10

## 2024-04-16 MED ORDER — CALCIUM-VITAMIN D-VITAMIN K 650-12.5-40 MG-MCG-MCG PO CHEW: 1.0000 | CHEWABLE_TABLET | Freq: Every day | ORAL | Status: DC

## 2024-04-16 NOTE — Progress Notes (Signed)
 Brief progress note: - Patient was admitted earlier today.  As per H&P done on presentation: Beverly Li is a 79 y.o. 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  She denies any fever or chills.  No nausea or vomiting or abdominal pain.  She has been having bilateral lower extremity edema and orthopnea.  No dysuria, oliguria or hematuria or flank pain.     ED Course: When the patient came to the ER, heart rate was 108 and respiratory rate 21 with pulse oximetry of 90 % on room air and 596% on 4 L of O2 by nasal cannula.  Labs revealed a BUN of 31 with creatinine 1.24 above previous levels and CMP was otherwise unremarkable.  proBNP was 2700 and high-sensitivity troponin T was 70.  CBC with normal EKG as reviewed by me : EKG showed atrial fibrillation with RVR of 125 and probable left atrial enlargement and Q waves anteroseptally. Imaging: Portable chest x-ray showed the following: 1. Cardiomegaly. 2. 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 CT of the abdomen pelvis with and without contrast is currently pending. Chest CT without contrast revealed the following: 1. Right lower lobe volume loss with area of consolidation and bronchiectasis replacing much of the basal segments. I suspect this is probably a fibrotic consolidation such as due to chronic aspiration although active infection certainly is possible. This was beginning to develop in 2022. 2. Just above this area of consolidation posteriorly there is an ovoid opacity measuring 2.2  x 1.2 cm which was also noted on the 2 recent prior studies. This is probably either a rounded atelectasis or nodular scar but underlying neoplasm is possible. PET-CT is recommended for further study. 3. 5 mm posterior left lower lobe nodule unchanged over the last 2 studies but the area obscured by atelectasis in 2022. 4. Severe emphysema with diffuse bronchial thickening.    The patient was given 125 mg of IV Solu-Medrol , DuoNeb and 40 mg of IV Lasix .  She will be admitted to a telemetry bed for further evaluation and management.  04/16/2024: Patient seen alongside patient's husband.  Patient denies chest pain.  Shortness of breath is improving with IV diuretics.  Likely discharge back to the facility tomorrow.

## 2024-04-16 NOTE — Assessment & Plan Note (Signed)
-   Continue Remeron  and Wellbutrin  XL.

## 2024-04-16 NOTE — Assessment & Plan Note (Signed)
-   Will continue antihypertensive therapy.

## 2024-04-16 NOTE — Assessment & Plan Note (Signed)
 Continue PPI therapy.

## 2024-04-16 NOTE — Assessment & Plan Note (Addendum)
-   This is likely secondary to acute on chronic diastolic CHF.  The patient had grade 2 diastolic function on a 2D echo on 07/08/21. - It could in addition be related to bronchiectasis and COPD exacerbation - The patient will be admitted to a telemetry bed. - Will continue diuresis with IV Lasix . - Will continue to bag therapy with IV Rocephin  and add mucolytic therapy as well as bronchodilator therapy with DuoNebs. - O2 protocol will be followed.

## 2024-04-16 NOTE — ED Notes (Signed)
 Patient family member voices frustration with patient being moved to hallway. Charge nurse has already spoken with patient about choice to move patient to hallway bed. Pt family member requesting to speak with MD regarding plan of care/leaving hospital at this time. Patient agreeable to ordered breathing treatment but hold/refusal to lasix  and other interventions at this time. Secure chat sent to Rosario, MD. ED secretary asked to page attending.

## 2024-04-16 NOTE — ED Notes (Signed)
 Patient husband has spoken with Rosario, MD on phone regarding plan of care.

## 2024-04-16 NOTE — ED Notes (Signed)
 CCMD contacted to put patient on monitor for room transfer.

## 2024-04-16 NOTE — ED Notes (Signed)
 Pt family member upset with choice to move patient out to hallway bed. Charge nurse asked to speak with patient regarding decision.

## 2024-04-16 NOTE — H&P (Addendum)
 "     Beverly Li   PATIENT NAME: Beverly Li    MR#:  [id number]  DATE OF BIRTH:  [birth year]  DATE OF ADMISSION:  04/15/2024  PRIMARY CARE PHYSICIAN: Beverly Fredia CROME, MD   Patient is coming from: Home  REQUESTING/REFERRING PHYSICIAN: Jarold Olam HERO, PA-C   CHIEF COMPLAINT:   Chief Complaint  Patient presents with   Shortness of Breath    HISTORY OF PRESENT ILLNESS:  Beverly Li is a 79 y.o. 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  She denies any fever or chills.  No nausea or vomiting or abdominal pain.  She has been having bilateral lower extremity edema and orthopnea.  No dysuria, oliguria or hematuria or flank pain.    ED Course: When the patient came to the ER, heart rate was 108 and respiratory rate 21 with pulse oximetry of 90 % on room air and 596% on 4 L of O2 by nasal cannula.  Labs revealed a BUN of 31 with creatinine 1.24 above previous levels and CMP was otherwise unremarkable.  proBNP was 2700 and high-sensitivity troponin T was 70.  CBC with normal EKG as reviewed by me : EKG showed atrial fibrillation with RVR of 125 and probable left atrial enlargement and Q waves anteroseptally. Imaging: Portable chest x-ray showed the following: 1. Cardiomegaly. 2. 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 CT of the abdomen pelvis with and without contrast is currently pending. Chest CT without contrast revealed the following: 1. Right lower lobe volume loss with area of consolidation and bronchiectasis replacing  much of the basal segments. I suspect this is probably a fibrotic consolidation such as due to chronic aspiration although active infection certainly is possible. This was beginning to develop in 2022. 2. Just above this area of consolidation posteriorly there is an ovoid opacity measuring 2.2 x 1.2 cm which was also noted on the 2 recent prior studies. This is probably either a rounded atelectasis or nodular scar but underlying neoplasm is possible. PET-CT is recommended for further study. 3. 5 mm posterior left lower lobe nodule unchanged over the last 2 studies but the area obscured by atelectasis in 2022. 4. Severe emphysema with diffuse bronchial thickening.   The patient was given 125 mg of IV Solu-Medrol , DuoNeb and 40 mg of IV Lasix .  She will be admitted to a telemetry bed for further evaluation and management.  PAST MEDICAL HISTORY:   Past Medical History:  Diagnosis Date   Anxiety    Arthritis    some in my lower back; probably elbows, knees (11/18/2017)   Atrial fibrillation (HCC)    Bell's palsy    when pt. was 79 yrs old, when under stress the left side of face will droop.   Complication of anesthesia    vascular OR 2016; BP bottomed out; couldn't get it regulated; ended up in ICU for DAYS (11/18/2017)   GERD (gastroesophageal reflux disease)    History of kidney stones    Hypertension    Hypertrophic cardiomyopathy (HCC)    severe LV basilar hypertrophy witn no evidence of significant outflow tract obstruction, EF 65-70%, mild  LAE, mild TR, grade 1a diastolic dysfunction 05/15/10 (Beverly Li) (Atrial Septal Hypertrophy pattern)-- Intra-op TEE with dsignificant outflow tract obstruction - AI, MR & TR   Insomnia    Mild aortic sclerosis    Osteopenia    Peripheral vascular disease    Syncope    , Vagal    PAST SURGICAL HISTORY:   Past Surgical History:  Procedure Laterality Date   AUGMENTATION MAMMAPLASTY Bilateral    BACK SURGERY     CARDIAC  CATHETERIZATION N/A 05/07/2016   Procedure: Left Heart Cath and Coronary Angiography;  Surgeon: Beverly JONETTA Cash, MD;  Location: [facility] INVASIVE CV LAB;  Service: Cardiovascular;  Laterality: N/A;   CARDIOVERSION N/A 09/24/2017   Procedure: CARDIOVERSION;  Surgeon: Beverly Ezra RAMAN, MD;  Location: [facility] ENDOSCOPY;  Service: Cardiovascular;  Laterality: N/A;   DILATION AND CURETTAGE OF UTERUS     ENDARTERECTOMY FEMORAL Right 03/02/2015   Procedure: ENDARTERECTOMY RIGHT FEMORAL;  Surgeon: Beverly Li Blade, MD;  Location: [facility] OR;  Service: Vascular;  Laterality: Right;   ESOPHAGOGASTRODUODENOSCOPY (EGD) WITH PROPOFOL  N/A 12/01/2020   Procedure: ESOPHAGOGASTRODUODENOSCOPY (EGD) WITH PROPOFOL ;  Surgeon: Beverly Dreama SAILOR, MD;  Location: [facility] ENDOSCOPY;  Service: General;  Laterality: N/A;   FACIAL COSMETIC SURGERY Left 2002   related to Bell's Palsy @ age 20; left eye/side of face droopy; tried to make area symmetrical   FEMORAL-POPLITEAL BYPASS GRAFT Right 03/02/2015   Procedure: BYPASS GRAFT FEMORAL-BELOW KNEE POPLITEAL ARTERY;  Surgeon: Beverly Li Blade, MD;  Location: [facility] OR;  Service: Vascular;  Laterality: Right;   INGUINAL HERNIA REPAIR Bilateral 2002   IR ANGIO INTRA EXTRACRAN SEL COM CAROTID INNOMINATE UNI L MOD SED  11/16/2020   IR CT HEAD LTD  11/13/2020   IR PERCUTANEOUS ART THROMBECTOMY/INFUSION INTRACRANIAL INC DIAG ANGIO  11/13/2020   OVARIAN CYST REMOVAL Left    PEG PLACEMENT N/A 12/01/2020   Procedure: PERCUTANEOUS ENDOSCOPIC GASTROSTOMY (PEG) PLACEMENT;  Surgeon: Beverly Dreama SAILOR, MD;  Location: [facility] ENDOSCOPY;  Service: General;  Laterality: N/A;   PERIPHERAL VASCULAR CATHETERIZATION N/A 01/16/2015   Procedure: Abdominal Aortogram;  Surgeon: Beverly Li Blade, MD;  Location: [facility] INVASIVE CV LAB;  Service: Cardiovascular;  Laterality: N/A;   POSTERIOR LUMBAR FUSION  2015   have plates and screws in there   RADIOLOGY WITH ANESTHESIA N/A 11/13/2020   Procedure: IR WITH ANESTHESIA;   Surgeon: Radiologist, Medication, MD;  Location: [facility] OR;  Service: Radiology;  Laterality: N/A;   TONSILLECTOMY      SOCIAL HISTORY:   Social History   Tobacco Use   Smoking status: Former    Current packs/day: 0.00    Average packs/day: 1 pack/day for 50.0 years (50.0 ttl pk-yrs)    Types: Cigarettes    Start date: 12/14/1964    Quit date: 12/15/2014    Years since quitting: 9.3   Smokeless tobacco: Never   Tobacco comments:    Former smoker 03/10/24  Substance Use Topics   Alcohol  use: Yes    Comment: 11/18/2017 might have a couple glasses of wine/month; if that    FAMILY HISTORY:   Family History  Problem Relation Age of Onset   Liver cancer Mother    Cancer Mother        Liver   Hypertension Mother    Lung cancer Father    Cancer Father        Lung   Breast cancer Sister    Cancer Sister        Breast  DRUG ALLERGIES:  Allergies[1]  REVIEW OF SYSTEMS:   ROS As per history of present illness. All pertinent systems were reviewed above. Constitutional, HEENT, cardiovascular, respiratory, GI, GU, musculoskeletal, neuro, psychiatric, endocrine, integumentary and hematologic systems were reviewed and are otherwise negative/unremarkable except for positive findings mentioned above in the HPI.   MEDICATIONS AT HOME:   Prior to Admission medications  Medication Sig Start Date End Date Taking? Authorizing Provider  acetaminophen  (TYLENOL ) 325 MG tablet Take 650 mg by mouth every 4 (four) hours as needed.   Yes [provider]  albuterol  (VENTOLIN  HFA) 108 (90 Base) MCG/ACT inhaler Inhale 2 puffs into the lungs every 4 (four) hours as needed for wheezing or shortness of breath. 10/08/23  Yes Fargo, Amy E, NP  amiodarone  (PACERONE ) 100 MG tablet Take 100 mg by mouth daily.   Yes [provider]  amLODipine  (NORVASC ) 2.5 MG tablet Take 2.5 mg by mouth. Give 5 mg in AM and 2.5 mg in PM   Yes [provider]  apixaban  (ELIQUIS ) 5 MG TABS tablet  Take 5 mg by mouth 2 (two) times daily. May give crushed   Yes [provider]  Benzonatate  (TESSALON  PERLES PO) Take 200 mg by mouth 3 (three) times daily as needed (for cough).   Yes [provider]  buPROPion  (WELLBUTRIN  XL) 150 MG 24 hr tablet Take 150 mg by mouth daily.   Yes [provider]  Calcium -Vitamin D -Vitamin K (VIACTIV CALCIUM  PLUS D) 650-12.5-40 MG-MCG-MCG CHEW Chew 1 tablet by mouth daily.   Yes [provider]  Cholecalciferol (VITAMIN D3) 20 MCG (800 UNIT) TABS Take 1 tablet by mouth daily.   Yes [provider]  denosumab (PROLIA) 60 MG/ML SOSY injection Inject 60 mg into the skin every 6 (six) months.   Yes [provider]  Eyelid Cleansers (OCUSOFT EYELID CLEANSING) PADS Apply 1 Application topically daily. 07/04/23  Yes Wert, Tawni, NP  ezetimibe  (ZETIA ) 10 MG tablet Take 10 mg by mouth daily. Oral if crushed   Yes [provider]  hydrALAZINE  (APRESOLINE ) 10 MG tablet Take 2 tablets (20 mg total) by mouth 3 (three) times daily. 03/15/24  Yes Beverly Fredia CROME, MD  mirtazapine  (REMERON ) 7.5 MG tablet Take 1 tablet (7.5 mg total) by mouth at bedtime. 03/22/24  Yes Darlean Tawni, NP  pantoprazole  (PROTONIX ) 40 MG tablet Take 40 mg by mouth 2 (two) times daily.   Yes [provider]  polyethylene glycol powder (GLYCOLAX /MIRALAX ) 17 GM/SCOOP powder Take 17 g by mouth every other day. 05/31/23  Yes Wert, Tawni, NP  sennosides-docusate sodium  (SENOKOT-S) 8.6-50 MG tablet Take 2 tablets by mouth 2 (two) times daily.   Yes [provider]  Tiotropium Bromide-Olodaterol (STIOLTO RESPIMAT ) 2.5-2.5 MCG/ACT AERS Inhale 2 puffs into the lungs daily.   Yes [provider]  cloNIDine (CATAPRES) 0.1 MG tablet Take 0.1 mg by mouth daily as needed.    [provider]  diclofenac Sodium (VOLTAREN) 1 % GEL Apply 4 g topically every 6 (six) hours as needed. Apply one application to right hip and  knee topically.    [provider]  doxycycline  (VIBRAMYCIN ) 100 MG capsule Take 100 mg by mouth 2 (two) times daily. 03/26/24   [provider]  Emollient (CERAVE) CREA Apply 1 Application topically as needed.    [provider]  furosemide  (LASIX ) 20 MG tablet Take 20 mg by mouth daily as needed. Give Lasix  20mg  for weight gain equal or greater than 3  pounds    [provider]  hydrALAZINE  (APRESOLINE ) 10 MG tablet Take 10 mg by mouth 3 (three) times daily as needed (SBP > 165 or DBP > 95).    [provider]  hydrocortisone cream 1 % Apply 1 Application topically 2 (two) times daily as needed for itching.    [provider]  ibuprofen (ADVIL) 600 MG tablet Take 600 mg by mouth. Give 600 mg by mouth every 03 hours as needed for For pain following oral surgery Alternate Ibuprofen 600 mg PO q3h with Tylenol  500 mg PO    [provider]  ipratropium-albuterol  (DUONEB) 0.5-2.5 (3) MG/3ML SOLN Inhale 3 mLs into the lungs every 6 (six) hours as needed.    [provider]  ketoconazole  (NIZORAL ) 2 % cream Apply 1 application  topically every 12 (twelve) hours as needed for irritation.    [provider]  ondansetron  (ZOFRAN ) 4 MG tablet Take 4 mg by mouth every 6 (six) hours as needed for nausea or vomiting.    [provider]  OXYGEN  Inhale 3 L into the lungs as directed. to maintain sats > or equal to 90%. May titrate Three Times A Day; 07:00 AM - 03:00 PM, 03:00 PM - 11:00 PM, 11:00 PM - 7:00 am    [provider]  Polyethyl Glycol-Propyl Glycol (SYSTANE) 0.4-0.3 % SOLN Apply 2 drops to eye every 6 (six) hours as needed (dry eyes).    [provider]      VITAL SIGNS:  Blood pressure 119/65, pulse 76, temperature 97.8 F (36.6 C), temperature source Oral, resp. rate 13, height 5' 5 (1.651 m), weight 41.3 kg, SpO2 94%.  PHYSICAL EXAMINATION:  Physical Exam  GENERAL:  79 y.o.-year-old  Caucasian female patient lying in the bed with mild respiratory distress with conversational dyspnea.  EYES: Pupils equal, round, reactive to light and accommodation. No scleral icterus. Extraocular muscles intact.  HEENT: Head atraumatic, normocephalic. Oropharynx and nasopharynx clear.  NECK:  Supple, no jugular venous distention. No thyroid  enlargement, no tenderness.  LUNGS: Diminished bibasilar breath sounds with bibasal crackles.. No use of accessory muscles of respiration.  CARDIOVASCULAR: Regular rate and rhythm, S1, S2 normal. No murmurs, rubs, or gallops.  ABDOMEN: Soft, nondistended, nontender. Bowel sounds present. No organomegaly or mass.  EXTREMITIES: No pedal edema, cyanosis, or clubbing.  NEUROLOGIC: Cranial nerves II through XII are intact except for dysarthria .  She has right-sided hemiplegia.  Sensation intact. Gait not checked.  PSYCHIATRIC: The patient is alert and oriented x 3.  Normal affect and good eye contact. SKIN: No obvious rash, lesion, or ulcer.   LABORATORY PANEL:   CBC Recent Labs  Lab 04/15/24 2340  WBC 9.2  HGB 13.9  HCT 44.3  PLT 270   ------------------------------------------------------------------------------------------------------------------  Chemistries  Recent Labs  Lab 04/15/24 2340  NA 143  K 4.1  CL 103  CO2 28  GLUCOSE 102*  BUN 31*  CREATININE 1.24*  CALCIUM  8.9  AST 27  ALT 13  ALKPHOS 75  BILITOT 0.4   ------------------------------------------------------------------------------------------------------------------  Cardiac Enzymes No results for input(s): TROPONINI in the last 168 hours. ------------------------------------------------------------------------------------------------------------------  RADIOLOGY:  DG Chest Port 1 View Result Date: 04/16/2024 EXAM: 1 VIEW(S) XRAY OF THE CHEST 04/16/2024 12:04:00 AM COMPARISON: 06/18/2023 CLINICAL HISTORY: SOB FINDINGS: LUNGS AND PLEURA: Eventration of the right  hemidiaphragm again noted, better visualized on a CT examination of 04/12/2024. No focal pulmonary opacity. No pleural effusion. No pneumothorax. HEART AND MEDIASTINUM: Cardiomegaly. Tortuous, aneurysmal  thoracic aorta, accentuated by semi-erect positioning. BONES AND SOFT TISSUES: Calcified right breast implant noted. No acute osseous abnormality. IMPRESSION: 1. Cardiomegaly. 2. 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 Electronically signed by: Dorethia Molt MD 04/16/2024 12:42 AM EST RP Workstation: [id number]      IMPRESSION AND PLAN:  Assessment and Plan: * Acute on chronic respiratory failure with hypoxia (HCC) - This is likely secondary to acute on chronic diastolic CHF.  The patient had grade 2 diastolic function on a 2D echo on 07/08/21. - It could in addition be related to bronchiectasis and COPD exacerbation - The patient will be admitted to a telemetry bed. - Will continue diuresis with IV Lasix . - Will continue to bag therapy with IV Rocephin  and add mucolytic therapy as well as bronchodilator therapy with DuoNebs. - O2 protocol will be followed.  Essential hypertension - Will continue antihypertensive therapy.  GERD without esophagitis - Continue PPI therapy.  Depression - Continue Remeron  and Wellbutrin  XL.    DVT prophylaxis: Eliquis . Advanced Care Planning:  Code Status: She is DNR and DNI.  This was discussed with her. Family Communication:  The plan of care was discussed in details with the patient (and family). I answered all questions. The patient agreed to proceed with the above mentioned plan. Further management will depend upon hospital course. Disposition Plan: Back to previous home environment Consults called: none.  All the records are reviewed and case discussed with ED provider.  Status is: Inpatient  At the time of the admission, it appears that the appropriate admission status for this patient is inpatient.  This  is judged to be reasonable and necessary in order to provide the required intensity of service to ensure the patient's safety given the presenting symptoms, physical exam findings and initial radiographic and laboratory data in the context of comorbid conditions.  The patient requires inpatient status due to high intensity of service, high risk of further deterioration and high frequency of surveillance required.  I certify that at the time of admission, it is my clinical judgment that the patient will require inpatient hospital care extending more than 2 midnights.                            Dispo: The patient is from: Home              Anticipated d/c is to: Home              Patient currently is not medically stable to d/c.              Difficult to place patient: No  Madison DELENA Peaches M.D on 04/16/2024 at 5:24 AM  [facility]   From 7 PM-7 AM, contact night-coverage [email or web address]  CC: Primary care physician; Beverly Fredia CROME, MD     [1]  Allergies Allergen Reactions   Amoxicillin Other (See Comments)    UTI Has patient had a PCN reaction causing immediate rash, facial/tongue/throat swelling, SOB or lightheadedness with hypotension: No Has patient had a PCN reaction causing severe rash involving mucus membranes or skin necrosis: No Has patient had a PCN reaction that required hospitalization: No Has patient had a PCN reaction occurring within the last 10 years: Yes--UTI ONLY If all of the above answers are NO, then may proceed with Cephalosporin use.    Atenolol Cough   Crestor  [Rosuvastatin  Calcium ] Other (See Comments)    Muscle aches  Pravastatin  Other (See Comments)    Muscle aches   Sulfa Antibiotics Nausea Only   Codeine Other (See Comments)    hallucinations   "

## 2024-04-16 NOTE — Plan of Care (Signed)
" °  Problem: Education: Goal: Knowledge of General Education information will improve Description: Including pain rating scale, medication(s)/side effects and non-pharmacologic comfort measures Outcome: Progressing   Problem: Cardiac: Goal: Ability to achieve and maintain adequate cardiopulmonary perfusion will improve Outcome: Progressing   Problem: Education: Goal: Knowledge of General Education information will improve Description: Including pain rating scale, medication(s)/side effects and non-pharmacologic comfort measures Outcome: Progressing   Problem: Cardiac: Goal: Ability to achieve and maintain adequate cardiopulmonary perfusion will improve Outcome: Progressing   "

## 2024-04-16 NOTE — ED Notes (Signed)
 NT assisted pt onto bedpan pt was not able to go told pt let NT know if she would like to try again

## 2024-04-16 NOTE — Progress Notes (Incomplete)
 " PROGRESS NOTE    Beverly Li  FMW:[id number] DOB: 01-16-1946 DOA: 04/15/2024 PCP: [name], MD  Outpatient Specialists:     Brief Narrative:  ***   Assessment & Plan:   Principal Problem:   Acute on chronic respiratory failure with hypoxia (HCC) Active Problems:   Essential hypertension   Depression   Paroxysmal atrial fibrillation with RVR (HCC)   GERD without esophagitis   ***   DVT prophylaxis: *** Code Status: *** Family Communication: *** Disposition Plan: ***   Consultants:  ***  Procedures:  ***  Antimicrobials:  ***    Subjective: ***  Objective: Vitals:   04/16/24 0731 04/16/24 0734 04/16/24 1042 04/16/24 1057  BP:  116/67 120/66   Pulse:  72 78   Resp:  14 16   Temp: 98.2 F (36.8 C)   98.2 F (36.8 C)  TempSrc: Oral   Axillary  SpO2:  96% 97%   Weight:      Height:       No intake or output data in the 24 hours ending 04/16/24 1417 Filed Weights   04/15/24 2333  Weight: 41.3 kg    Examination:  General exam: Appears calm and comfortable  Respiratory system: Clear to auscultation. Respiratory effort normal. Cardiovascular system: S1 & S2 heard, RRR. No JVD, murmurs, rubs, gallops or clicks. No pedal edema. Gastrointestinal system: Abdomen is nondistended, soft and nontender. No organomegaly or masses felt. Normal bowel sounds heard. Central nervous system: Alert and oriented. No focal neurological deficits. Extremities: Symmetric 5 x 5 power. Skin: No rashes, lesions or ulcers Psychiatry: Judgement and insight appear normal. Mood & affect appropriate.     Data Reviewed: I have personally reviewed following labs and imaging studies  CBC: Recent Labs  Lab 04/15/24 2340 04/16/24 0615  WBC 9.2 7.6  NEUTROABS 6.8  --   HGB 13.9 13.1  HCT 44.3 41.4  MCV 96.5 95.4  PLT 270 269   Basic Metabolic Panel: Recent Labs  Lab 04/15/24 2340 04/16/24 0615  NA 143 144  K 4.1 4.3  CL 103 103  CO2 28 27  GLUCOSE  102* 123*  BUN 31* 34*  CREATININE 1.24* 1.23*  CALCIUM  8.9 8.5*   GFR: Estimated Creatinine Clearance: 24.6 mL/min (A) (by C-G formula based on SCr of 1.23 mg/dL (H)). Liver Function Tests: Recent Labs  Lab 04/15/24 2340  AST 27  ALT 13  ALKPHOS 75  BILITOT 0.4  PROT 6.9  ALBUMIN  3.9   No results for input(s): LIPASE, AMYLASE in the last 168 hours. No results for input(s): AMMONIA in the last 168 hours. Coagulation Profile: No results for input(s): INR, PROTIME in the last 168 hours. Cardiac Enzymes: No results for input(s): CKTOTAL, CKMB, CKMBINDEX, TROPONINI in the last 168 hours. BNP (last 3 results) Recent Labs    04/15/24 2340  PROBNP 2,700.0*   HbA1C: No results for input(s): HGBA1C in the last 72 hours. CBG: No results for input(s): GLUCAP in the last 168 hours. Lipid Profile: No results for input(s): CHOL, HDL, LDLCALC, TRIG, CHOLHDL, LDLDIRECT in the last 72 hours. Thyroid  Function Tests: No results for input(s): TSH, T4TOTAL, FREET4, T3FREE, THYROIDAB in the last 72 hours. Anemia Panel: No results for input(s): VITAMINB12, FOLATE, FERRITIN, TIBC, IRON , RETICCTPCT in the last 72 hours. Urine analysis:    Component Value Date/Time   COLORURINE AMBER (A) 12/12/2020 2043   APPEARANCEUR HAZY (A) 12/12/2020 2043   LABSPEC 1.020 12/12/2020 2043   PHURINE 6.0 12/12/2020  2043   GLUCOSEU NEGATIVE 12/12/2020 2043   HGBUR NEGATIVE 12/12/2020 2043   BILIRUBINUR NEGATIVE 12/12/2020 2043   KETONESUR NEGATIVE 12/12/2020 2043   PROTEINUR 100 (A) 12/12/2020 2043   UROBILINOGEN 1.0 02/27/2015 1127   NITRITE NEGATIVE 12/12/2020 2043   LEUKOCYTESUR SMALL (A) 12/12/2020 2043   Sepsis Labs: @LABRCNTIP (procalcitonin:4,lacticidven:4)  )No results found for this or any previous visit (from the past 240 hours).       Radiology Studies: DG Chest Port 1 View Result Date: 04/16/2024 EXAM: 1 VIEW(S) XRAY OF THE  CHEST 04/16/2024 12:04:00 AM COMPARISON: 06/18/2023 CLINICAL HISTORY: SOB FINDINGS: LUNGS AND PLEURA: Eventration of the right hemidiaphragm again noted, better visualized on a CT examination of 04/12/2024. No focal pulmonary opacity. No pleural effusion. No pneumothorax. HEART AND MEDIASTINUM: Cardiomegaly. Tortuous, aneurysmal thoracic aorta, accentuated by semi-erect positioning. BONES AND SOFT TISSUES: Calcified right breast implant noted. No acute osseous abnormality. IMPRESSION: 1. Cardiomegaly. 2. 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 Electronically signed by: Dorethia Molt MD 04/16/2024 12:42 AM EST RP Workstation: [id number]        Scheduled Meds:  amiodarone   100 mg Oral Daily   amLODipine   2.5 mg Oral Daily   apixaban   5 mg Oral BID   buPROPion   150 mg Oral Daily   cholecalciferol  1,000 Units Oral Daily   ezetimibe   10 mg Oral Daily   furosemide   40 mg Intravenous Q12H   guaiFENesin   600 mg Oral BID   hydrALAZINE   20 mg Oral TID   ipratropium-albuterol   3 mL Nebulization QID   mirtazapine   7.5 mg Oral QHS   senna-docusate  2 tablet Oral BID   Continuous Infusions:  cefTRIAXone  (ROCEPHIN )  IV Stopped (04/16/24 0716)     LOS: 0 days    Time spent: ***    Leatrice Chapel, MD  [facility] Pager #: [phone] 7PM-7AM contact night coverage as above    "

## 2024-04-17 MED ORDER — IPRATROPIUM-ALBUTEROL 0.5-2.5 (3) MG/3ML IN SOLN: 3.0000 mL | Freq: Two times a day (BID) | RESPIRATORY_TRACT | Status: DC

## 2024-04-17 MED ORDER — TORSEMIDE 20 MG PO TABS: 20.0000 mg | ORAL_TABLET | Freq: Every day | ORAL | 0 refills | Status: AC

## 2024-04-17 NOTE — TOC Transition Note (Signed)
 Transition of Care [facility]) - Discharge Note   Patient Details  Name: Beverly Li MRN: [id number] Date of Birth: 27-Jul-1945  Transition of [facility]) CM/SW Contact:  Hartley KATHEE Robertson, LCSWA Phone Number: 04/17/2024, 12:54 PM   Clinical Narrative:     CSW spoke with pt's spouse via phone, he states pt was from [facility] and can return today per nursing, pt's spouse states they will have pt transported back to [place] via wheelchair van. CSW spoke with nursing at [facility], pt is cleared to return, request report be called to [phone], pt's nurse made aware.         Patient Goals and CMS Choice            Discharge Placement                       Discharge Plan and Services Additional resources added to the After Visit Summary for                                       Social Drivers of Health (SDOH) Interventions SDOH Screenings   Food Insecurity: No Food Insecurity (04/16/2024)  Housing: Low Risk (04/16/2024)  Transportation Needs: No Transportation Needs (04/16/2024)  Utilities: Not At Risk (04/16/2024)  Alcohol  Screen: Low Risk (08/13/2022)  Depression (PHQ2-9): Low Risk (12/16/2023)  Financial Resource Strain: Low Risk (08/13/2022)  Physical Activity: Insufficiently Active (08/13/2022)  Social Connections: Moderately Isolated (04/16/2024)  Stress: No Stress Concern Present (08/13/2022)  Tobacco Use: Medium Risk (04/01/2024)     Readmission Risk Interventions     No data to display

## 2024-04-17 NOTE — Discharge Summary (Signed)
 Physician Discharge Summary  Patient ID: Beverly Li MRN: [id number] DOB/AGE: October 10, 1945 79 y.o.  Admit date: 04/15/2024 Discharge date: 04/17/2024  Admission Diagnoses:  Discharge Diagnoses:  Principal Problem:   Acute on chronic respiratory failure with hypoxia (HCC) Active Problems:   Essential hypertension   Depression   Paroxysmal atrial fibrillation with RVR (HCC)   GERD without esophagitis Acute on chronic diastolic CHF Possible COPD with exacerbation   Discharged Condition: stable  Hospital Course: Patient is a 79 year old female with past medical history significant for anxiety, osteoarthritis, paroxysmal atrial fibrillation, Bell's palsy, GERD, HOCM, coronary artery disease, essential hypertension, 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  Patient presented to the hospital with acute onset of midsternal chest pain extending to both sides, associated with dyspnea, orthopnea, worsening bilateral lower extremity edema and occasional wheezing.  No associated fever or chills.  Patient was admitted and managed for acute on chronic diastolic CHF and possible COPD with exacerbation.  Patient improved with IV Lasix .  Patient will be discharged back to the skilled nursing facility on torsemide  20 Mg p.o. once daily.  Troponin was mildly elevated, but flat.  Patient has chronically elevated troponin.  No further chest pain reported during the hospital stay.  Primary care provider and the cardiology team will kindly adjust diuretics accordingly.  Patient has been optimized.  Patient is eager to be discharged back to the skilled nursing facility.   Acute on chronic respiratory failure with hypoxia (HCC)/acute on chronic diastolic CHF/COPD with possible exacerbation: - Likely secondary to acute on chronic diastolic CHF.   -The patient had grade 2 diastolic function on a 2D echo on 07/08/21. - Patient was managed  with IV Lexis. - Respiratory symptoms and leg edema improved with diuresis. - Patient was also managed for COPD with possible exacerbation.   -Patient will be discharged back to the skilled nursing facility on torsemide  20 Mg p.o. once daily.  Essential hypertension - Continue to monitor closely. - Goal blood pressure of less than 130/80 mmHg.     GERD without esophagitis - Continue PPI therapy. -PCP to review PPI therapy.   Depression - Continue Remeron  and Wellbutrin  XL.  Consults: None  Significant Diagnostic Studies:  Echo revealed: 1. Left ventricular ejection fraction, by estimation, is 70 to 75%. The  left ventricle has hyperdynamic function. The left ventricle has no  regional wall motion abnormalities. There is moderate concentric left  ventricular hypertrophy. Left ventricular  diastolic parameters are consistent with Grade I diastolic dysfunction  (impaired relaxation).   2. Right ventricular systolic function is normal. The right ventricular  size is normal. There is normal pulmonary artery systolic pressure.   3. Left atrial size was mildly dilated.   4. The mitral valve is normal in structure. No evidence of mitral valve  regurgitation. No evidence of mitral stenosis.   5. The aortic valve is tricuspid. Aortic valve regurgitation is not  visualized. No aortic stenosis is present.   6. The inferior vena cava is normal in size with greater than 50%  respiratory variability, suggesting right atrial pressure of 3 mmHg.   Troponin of [phone].  proBNP 2700.  Chest x-ray revealed: 1. Cardiomegaly. 2. 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   Electronically signed by: Dorethia Molt MD 04/16/2024 12:42 AM EST RP Workstation: [id number]    Discharge Exam: Blood pressure 114/63, pulse 65, temperature 98.1 F (36.7 C), temperature source Axillary,  resp. rate 17, height 5' 5 (1.651 m), weight 41 kg, SpO2  100%.   Disposition: Discharge disposition: 03-Skilled Nursing Facility       Discharge Instructions     Ambulatory Referral for Lung Cancer Scre   Complete by: As directed    Increase activity slowly   Complete by: As directed       Allergies as of 04/17/2024       Reactions   Amoxicillin Other (See Comments)   UTI Has patient had a PCN reaction causing immediate rash, facial/tongue/throat swelling, SOB or lightheadedness with hypotension: No Has patient had a PCN reaction causing severe rash involving mucus membranes or skin necrosis: No Has patient had a PCN reaction that required hospitalization: No Has patient had a PCN reaction occurring within the last 10 years: Yes--UTI ONLY If all of the above answers are NO, then may proceed with Cephalosporin use.   Atenolol Cough   Crestor  [rosuvastatin  Calcium ] Other (See Comments)   Muscle aches   Pravastatin  Other (See Comments)   Muscle aches   Sulfa Antibiotics Nausea Only   Codeine Other (See Comments)   hallucinations        Medication List     STOP taking these medications    CeraVe Crea   furosemide  20 MG tablet Commonly known as: LASIX    hydrocortisone cream 1 %   ibuprofen 600 MG tablet Commonly known as: ADVIL   ketoconazole  2 % cream Commonly known as: NIZORAL    ondansetron  4 MG tablet Commonly known as: ZOFRAN    Voltaren 1 % Gel Generic drug: diclofenac Sodium       TAKE these medications    acetaminophen  325 MG tablet Commonly known as: TYLENOL  Take 650 mg by mouth every 4 (four) hours as needed.   albuterol  108 (90 Base) MCG/ACT inhaler Commonly known as: VENTOLIN  HFA Inhale 2 puffs into the lungs every 4 (four) hours as needed for wheezing or shortness of breath.   amiodarone  100 MG tablet Commonly known as: PACERONE  Take 100 mg by mouth daily.   amLODipine  2.5 MG tablet Commonly known as: NORVASC  Take 2.5 mg by mouth. Give 5 mg in AM and 2.5 mg in PM   apixaban  5 MG  Tabs tablet Commonly known as: ELIQUIS  Take 5 mg by mouth 2 (two) times daily. May give crushed   buPROPion  150 MG 24 hr tablet Commonly known as: WELLBUTRIN  XL Take 150 mg by mouth daily.   cloNIDine  0.1 MG tablet Commonly known as: CATAPRES  Take 0.1 mg by mouth daily as needed (for sBP above 170).   denosumab 60 MG/ML Sosy injection Commonly known as: PROLIA Inject 60 mg into the skin every 6 (six) months.   ezetimibe  10 MG tablet Commonly known as: ZETIA  Take 10 mg by mouth daily. Oral if crushed   hydrALAZINE  10 MG tablet Commonly known as: APRESOLINE  Take 10 mg by mouth daily as needed (for sBP above 170).   hydrALAZINE  10 MG tablet Commonly known as: APRESOLINE  Take 2 tablets (20 mg total) by mouth 3 (three) times daily.   ipratropium-albuterol  0.5-2.5 (3) MG/3ML Soln Commonly known as: DUONEB Inhale 3 mLs into the lungs every 6 (six) hours as needed (for shortness of breath or cough).   mirtazapine  7.5 MG tablet Commonly known as: REMERON  Take 1 tablet (7.5 mg total) by mouth at bedtime.   OcuSoft Eyelid Cleansing Pads Apply 1 Application topically daily.   OXYGEN  Inhale 3 L into the lungs as directed. to maintain sats >  or equal to 90%. May titrate Three Times A Day; 07:00 AM - 03:00 PM, 03:00 PM - 11:00 PM, 11:00 PM - 7:00 am   pantoprazole  40 MG tablet Commonly known as: PROTONIX  Take 40 mg by mouth 2 (two) times daily.   polyethylene glycol powder 17 GM/SCOOP powder Commonly known as: GLYCOLAX /MIRALAX  Take 17 g by mouth every other day.   sennosides-docusate sodium  8.6-50 MG tablet Commonly known as: SENOKOT-S Take 2 tablets by mouth 2 (two) times daily.   Stiolto Respimat  2.5-2.5 MCG/ACT Aers Generic drug: Tiotropium Bromide-Olodaterol Inhale 2 puffs into the lungs daily.   Systane 0.4-0.3 % Soln Generic drug: Polyethyl Glycol-Propyl Glycol Apply 2 drops to eye every 6 (six) hours as needed (dry eyes).   TESSALON  PERLES PO Take 200 mg by  mouth 3 (three) times daily as needed (for cough).   torsemide  20 MG tablet Commonly known as: DEMADEX  Take 1 tablet (20 mg total) by mouth daily.   Viactiv Calcium  Plus D 650-12.5-40 MG-MCG Chew Generic drug: Calcium -Vitamin D -Vitamin K  Chew 1 tablet by mouth daily.   Vitamin D3 20 MCG (800 UNIT) Tabs Take 1 tablet by mouth daily.       Time spent: 35 minutes.  SignedBETHA Leatrice LILLETTE Rosario 04/17/2024, 12:05 PM

## 2024-04-17 NOTE — Progress Notes (Signed)
 Called [phone] and gave report to Luke PEAK at [facility]. Pt will go back to [facility] via Family.

## 2024-04-19 ENCOUNTER — Other Ambulatory Visit: Payer: Self-pay | Admitting: Emergency Medicine

## 2024-04-19 ENCOUNTER — Encounter: Payer: Self-pay | Admitting: Internal Medicine

## 2024-04-19 DIAGNOSIS — J9621 Acute and chronic respiratory failure with hypoxia: Secondary | ICD-10-CM

## 2024-04-19 DIAGNOSIS — I69351 Hemiplegia and hemiparesis following cerebral infarction affecting right dominant side: Secondary | ICD-10-CM | POA: Diagnosis not present

## 2024-04-19 DIAGNOSIS — I10 Essential (primary) hypertension: Secondary | ICD-10-CM | POA: Diagnosis not present

## 2024-04-19 DIAGNOSIS — I480 Paroxysmal atrial fibrillation: Secondary | ICD-10-CM | POA: Diagnosis not present

## 2024-04-19 NOTE — Progress Notes (Signed)
 "   NURSING HOME LOCATION:  [facility] ROOM NUMBER:  [id number]  CODE STATUS:  DNR  PCP:  Fredia Bring MD  This is a comprehensive admission note to this SNFperformed on this date less than 30 days from date of admission. Included are preadmission medical/surgical history; reconciled medication list; family history; social history and comprehensive review of systems.  Corrections and additions to the records were documented. Comprehensive physical exam was also performed. Additionally a clinical summary was entered for each active diagnosis pertinent to this admission in the Problem List to enhance continuity of care.  HPI: She was hospitalized 1/1 - 04/17/2024 with acute on chronic respiratory failure with hypoxia; she presented with acute onset of midsternal chest pain with bilateral radiation across the thorax.  This was associated with dyspnea, orthopnea, occasional wheezing.  This is in the context of worsening bilateral lower extremity edema.  Portable chest x-ray revealed cardiomegaly, aneurysmal thoracic aorta changes with tortuosity, and eventration of the right hemidiaphragm.  CT 12/29 had revealed right lower lobe volume loss with an area of consolidation and bronchiectasis replacing most of the basal segments.  Probable fibrotic consolidation such as secondary to chronic aspiration was considered.  Active infection could not be ruled out.  These changes were initially noted to be developing in 2022.  Just above this area of consolidation, posteriorly there was an ovoid opacity measuring 2.2 x 1.2 cm representing either rounded atelectasis or nodular scar.  Underlying neoplasm could not be ruled out.  PET scan was recommended for further evaluation.  The 5 mm posterior left lower lobe nodule was unchanged on serial studies.  Severe emphysema with diffuse bronchial thickening was noted.  Acute on chronic diastolic congestive heart failure was diagnosed with possible COPD  exacerbation.  There was clinical improvement with IV Lasix  which was transitioned to torsemide  20 mg daily. Troponin was mildly elevated but serially flat.  Initial value was 70 and final value 63.  Creatinine was serially stable with final value 1.23.  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  CBC was normal.  Glucoses ranged from 102 up to 123. The most recent TSH was low normal at 0.76 on 02/18/2024. With clinical improvement; she was felt stable enough to return to the skilled facility.  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 Surgeries and procedures include augmentation mammoplasty; cardiac catheterization; cardioversion; femoral endarterectomy; EGD; facial cosmetic surgery; femoropopliteal bypass grafting; intracranial thrombectomy; and posterior lumbar fusion.  Family history: reviewed, non contributory due to advanced age.  Social history: Minimal alcohol  intake; former 50-pack-year smoker.   Review of systems: Marked dysarthria and clinical neurocognitive deficits made validity of responses questionable , compromising ROS completion.  When I asked her what diagnoses had been made in the hospital, her response was found some stuff, ordinary, nothing new.  She went on to state I am fine.  Subsequently she would seemingly answer positive to some queries but then within several seconds deny such.  This conflicting pattern of yes then no was repetitive.  She she seemingly validated chest pain and palpitations when I have those spells.  Physical exam:  Pertinent or positive findings: She was sitting in a lounge chair wearing nasal  oxygen .  Dysarthria was pronounced.  There was some facial asymmetry.  Ptosis was greater on the left.  The right nasolabial fold was decreased.  There was a raspy, grade 1 systolic murmur.  Breath sounds were decreased.  Dorsalis pedis pulses were stronger than the posterior tibial pulses.  She had trace edema at the sock line.  Interosseous wasting and limb atrophy were present.  Right hemiplegia is present.  General appearance: no acute distress, increased work of breathing is present.   Lymphatic: No lymphadenopathy about the head, neck, axilla. Eyes: No conjunctival inflammation or lid edema is present. There is no scleral icterus. Ears:  External ear exam shows no significant lesions or deformities.   Nose:  External nasal examination shows no deformity or inflammation. Nasal mucosa are pink and moist without lesions, exudates Oral exam: Lips and gums are healthy appearing.There is no oropharyngeal erythema or exudate. Neck:  No thyromegaly, masses, tenderness noted.    Heart:  Normal rate and regular rhythm. S1 and S2 normal without gallop,  click, rub.  Lungs: without wheezes, rhonchi, rales, rubs. Abdomen: Bowel sounds are normal.  Abdomen is soft and nontender with no organomegaly, hernias, masses. GU: Deferred  Extremities:  No cyanosis, clubbing. Neurologic exam:  Balance, Rhomberg, finger to nose testing could not be completed due to clinical state Skin: Warm & dry w/o tenting. No significant lesions or rash.  See clinical summary under each active problem in the Problem List with associated updated therapeutic plan :  Acute on chronic respiratory failure with hypoxia (HCC) CHF clinically compensated; no NVD ; insignificant peripheral edema on exam. No change in present cardiac regimen indicated.    PAF (paroxysmal atrial fibrillation) (HCC) Clinically rhythm is regular and rate controlled.  No change in cardiac regimen indicated.  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  "

## 2024-04-19 NOTE — Patient Instructions (Signed)
 See assessment and plan under each diagnosis in the problem list and acutely for this visit

## 2024-04-20 NOTE — Assessment & Plan Note (Signed)
 Clinically rhythm is regular and rate controlled.  No change in cardiac regimen indicated.

## 2024-04-20 NOTE — Assessment & Plan Note (Signed)
 CHF clinically compensated; no NVD ; insignificant peripheral edema on exam. No change in present cardiac regimen indicated.

## 2024-04-20 NOTE — Assessment & Plan Note (Signed)
 Right hemiplegia persist and is unchanged.

## 2024-04-20 NOTE — Assessment & Plan Note (Signed)
 BP controlled; no change in antihypertensive medications

## 2024-04-26 ENCOUNTER — Telehealth (HOSPITAL_COMMUNITY): Payer: Self-pay | Admitting: *Deleted

## 2024-04-26 MED ORDER — AMIODARONE HCL 200 MG PO TABS: 200.0000 mg | ORAL_TABLET | Freq: Every day | ORAL | Status: DC

## 2024-04-26 NOTE — Telephone Encounter (Signed)
 Received call from Nursing facility were pt stays from [facility] stating that pt is SOB with HR 120 and BP 120/72. I discussed with R. Fenton P.A. he wants to increase amiodarone  to 200 mg once daily and monitor HR for bradycardia. I called back and spoke with [facility] RN and reviewed instructions she understands and agrees with plan.RN confirmed no missed doses of Eliquis  at her facility.  Appt made for this week for f/u. ER precautions reviewed.

## 2024-04-27 ENCOUNTER — Encounter: Payer: Self-pay | Admitting: Orthopedic Surgery

## 2024-04-27 ENCOUNTER — Non-Acute Institutional Stay (SKILLED_NURSING_FACILITY): Admitting: Orthopedic Surgery

## 2024-04-27 DIAGNOSIS — R0602 Shortness of breath: Secondary | ICD-10-CM | POA: Diagnosis not present

## 2024-04-27 DIAGNOSIS — I5032 Chronic diastolic (congestive) heart failure: Secondary | ICD-10-CM

## 2024-04-27 DIAGNOSIS — I480 Paroxysmal atrial fibrillation: Secondary | ICD-10-CM | POA: Diagnosis not present

## 2024-04-27 DIAGNOSIS — E440 Moderate protein-calorie malnutrition: Secondary | ICD-10-CM | POA: Diagnosis not present

## 2024-04-27 NOTE — Progress Notes (Signed)
 " Location:  [facility] Room Number: 116/P Place of Service:  SNF [id number]) Provider:  Greig FORBES Cluster, NP   Beverly Fredia CROME, MD  Patient Care Team: Beverly Fredia CROME, MD as PCP - General (Internal Medicine) Jeffrie Oneil BROCKS, MD as PCP - Cardiology (Cardiology) [name], MD (Inactive) as PCP - Electrophysiology (Cardiology) Alix Charleston, MD as Consulting Physician (Neurosurgery)  Extended Emergency Contact Information Primary Emergency Contact: Dorman,[name] Address: [address]          [place] [id number] [place] Home Phone: [phone] Mobile Phone: [phone] Relation: Spouse Secondary Emergency Contact: Tollett,[name] Address: [address]          [place], [place] [address] [place] Mobile Phone: [phone] Relation: Son  Code Status: Full code Goals of care: Advanced Directive information    04/15/2024   11:35 PM  Advanced Directives  Does Patient Have a Medical Advance Directive? No  Would patient like information on creating a medical advance directive? No - Patient declined     Chief Complaint  Patient presents with   Acute Visit    Shortness of breath    HPI:  Pt is a 79 y.o. female seen today for acute visit due to shortness of breath.   She currently resides on the skilled nursing unit at [facility]. 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   Hospitalized 01/01-01/03 due to acute on chronic respiratory failure. Suspected CHF with COPD. Condition improved with IV Lasix . BNP was 2700. EF was 70-75% with grade I DD. Furosemide  was discontinued and she was switched to torsemide .   Today, nursing reports increased shortness of breath while turning in bed and small trips to bathroom. Sats decreased to 75% for brief moment. She is currently on 2-3 liters  oxygen . Sats > 90%.  Denies chest pain or cold symptoms. She is also experiencing HR> 100. Nursing reached out to afib clinic yesterday and was advised to increase amiodarone  to 200 mg. HR remains 100-110's today. ED evaluation recommended, refused at this time. Prefers work up at facility.   Current weight 86 lbs> was 91 lbs one month ago.    Past Medical History:  Diagnosis Date   Anxiety    Arthritis    some in my lower back; probably elbows, knees (11/18/2017)   Atrial fibrillation (HCC)    Bell's palsy    when pt. was 79 yrs old, when under stress the left side of face will droop.   Complication of anesthesia    vascular OR 2016; BP bottomed out; couldn't get it regulated; ended up in ICU for DAYS (11/18/2017)   GERD (gastroesophageal reflux disease)    History of kidney stones    Hypertension    Hypertrophic cardiomyopathy (HCC)    severe LV basilar hypertrophy witn no evidence of significant outflow tract obstruction, EF 65-70%, mild LAE, mild TR, grade 1a diastolic dysfunction 05/15/10 (Dr. Oneil Jeffrie) (Atrial Septal Hypertrophy pattern)-- Intra-op TEE with dsignificant outflow tract obstruction - AI, MR & TR   Insomnia    Mild aortic sclerosis    Osteopenia    Peripheral vascular disease    Syncope    , Vagal   Past Surgical History:  Procedure Laterality Date   AUGMENTATION MAMMAPLASTY Bilateral    BACK SURGERY     CARDIAC CATHETERIZATION N/A 05/07/2016  Procedure: Left Heart Cath and Coronary Angiography;  Surgeon: Lonni JONETTA Cash, MD;  Location: [facility] INVASIVE CV LAB;  Service: Cardiovascular;  Laterality: N/A;   CARDIOVERSION N/A 09/24/2017   Procedure: CARDIOVERSION;  Surgeon: Rolan Ezra RAMAN, MD;  Location: [facility] ENDOSCOPY;  Service: Cardiovascular;  Laterality: N/A;   DILATION AND CURETTAGE OF UTERUS     ENDARTERECTOMY FEMORAL Right 03/02/2015   Procedure: ENDARTERECTOMY RIGHT FEMORAL;  Surgeon: Lonni RAMAN Blade, MD;  Location: [facility] OR;  Service: Vascular;   Laterality: Right;   ESOPHAGOGASTRODUODENOSCOPY (EGD) WITH PROPOFOL  N/A 12/01/2020   Procedure: ESOPHAGOGASTRODUODENOSCOPY (EGD) WITH PROPOFOL ;  Surgeon: Paola Dreama SAILOR, MD;  Location: [facility] ENDOSCOPY;  Service: General;  Laterality: N/A;   FACIAL COSMETIC SURGERY Left 2002   related to Bell's Palsy @ age 31; left eye/side of face droopy; tried to make area symmetrical   FEMORAL-POPLITEAL BYPASS GRAFT Right 03/02/2015   Procedure: BYPASS GRAFT FEMORAL-BELOW KNEE POPLITEAL ARTERY;  Surgeon: Lonni RAMAN Blade, MD;  Location: [facility] OR;  Service: Vascular;  Laterality: Right;   INGUINAL HERNIA REPAIR Bilateral 2002   IR ANGIO INTRA EXTRACRAN SEL COM CAROTID INNOMINATE UNI L MOD SED  11/16/2020   IR CT HEAD LTD  11/13/2020   IR PERCUTANEOUS ART THROMBECTOMY/INFUSION INTRACRANIAL INC DIAG ANGIO  11/13/2020   OVARIAN CYST REMOVAL Left    PEG PLACEMENT N/A 12/01/2020   Procedure: PERCUTANEOUS ENDOSCOPIC GASTROSTOMY (PEG) PLACEMENT;  Surgeon: Paola Dreama SAILOR, MD;  Location: [facility] ENDOSCOPY;  Service: General;  Laterality: N/A;   PERIPHERAL VASCULAR CATHETERIZATION N/A 01/16/2015   Procedure: Abdominal Aortogram;  Surgeon: Lonni RAMAN Blade, MD;  Location: [facility] INVASIVE CV LAB;  Service: Cardiovascular;  Laterality: N/A;   POSTERIOR LUMBAR FUSION  2015   have plates and screws in there   RADIOLOGY WITH ANESTHESIA N/A 11/13/2020   Procedure: IR WITH ANESTHESIA;  Surgeon: Radiologist, Medication, MD;  Location: [facility] OR;  Service: Radiology;  Laterality: N/A;   TONSILLECTOMY      Allergies[1]  Outpatient Encounter Medications as of 04/27/2024  Medication Sig   acetaminophen  (TYLENOL ) 325 MG tablet Take 650 mg by mouth every 4 (four) hours as needed.   albuterol  (VENTOLIN  HFA) 108 (90 Base) MCG/ACT inhaler Inhale 2 puffs into the lungs every 4 (four) hours as needed for wheezing or shortness of breath.   amiodarone  (PACERONE ) 200 MG tablet Take 1 tablet (200 mg total) by mouth daily.   amLODipine  (NORVASC ) 2.5  MG tablet Take 2.5 mg by mouth. Give 5 mg in AM and 2.5 mg in PM   apixaban  (ELIQUIS ) 5 MG TABS tablet Take 5 mg by mouth 2 (two) times daily. May give crushed   Benzonatate  (TESSALON  PERLES PO) Take 200 mg by mouth 3 (three) times daily as needed (for cough).   buPROPion  (WELLBUTRIN  XL) 150 MG 24 hr tablet Take 150 mg by mouth daily.   Calcium -Vitamin D -Vitamin K  (VIACTIV CALCIUM  PLUS D) 650-12.5-40 MG-MCG-MCG CHEW Chew 1 tablet by mouth daily.   Cholecalciferol  (VITAMIN D3) 20 MCG (800 UNIT) TABS Take 1 tablet by mouth daily.   cloNIDine  (CATAPRES ) 0.1 MG tablet Take 0.1 mg by mouth daily as needed (for sBP above 170).   denosumab (PROLIA) 60 MG/ML SOSY injection Inject 60 mg into the skin every 6 (six) months.   Eyelid Cleansers (OCUSOFT EYELID CLEANSING) PADS Apply 1 Application topically daily.   ezetimibe  (ZETIA ) 10 MG tablet Take 10 mg by mouth daily. Oral if crushed   hydrALAZINE  (APRESOLINE ) 10 MG tablet Take 10 mg by  mouth daily as needed (for sBP above 170).   hydrALAZINE  (APRESOLINE ) 10 MG tablet Take 2 tablets (20 mg total) by mouth 3 (three) times daily.   ipratropium-albuterol  (DUONEB) 0.5-2.5 (3) MG/3ML SOLN Inhale 3 mLs into the lungs every 6 (six) hours as needed (for shortness of breath or cough).   mirtazapine  (REMERON ) 7.5 MG tablet Take 1 tablet (7.5 mg total) by mouth at bedtime.   OXYGEN  Inhale 3 L into the lungs as directed. to maintain sats > or equal to 90%. May titrate Three Times A Day; 07:00 AM - 03:00 PM, 03:00 PM - 11:00 PM, 11:00 PM - 7:00 am   pantoprazole  (PROTONIX ) 40 MG tablet Take 40 mg by mouth 2 (two) times daily.   Polyethyl Glycol-Propyl Glycol (SYSTANE) 0.4-0.3 % SOLN Apply 2 drops to eye every 6 (six) hours as needed (dry eyes).   polyethylene glycol powder (GLYCOLAX /MIRALAX ) 17 GM/SCOOP powder Take 17 g by mouth every other day.   sennosides-docusate sodium  (SENOKOT-S) 8.6-50 MG tablet Take 2 tablets by mouth 2 (two) times daily.   Tiotropium  Bromide-Olodaterol (STIOLTO RESPIMAT ) 2.5-2.5 MCG/ACT AERS Inhale 2 puffs into the lungs daily.   torsemide  (DEMADEX ) 20 MG tablet Take 1 tablet (20 mg total) by mouth daily.   No facility-administered encounter medications on file as of 04/27/2024.    Review of Systems  Constitutional:  Positive for unexpected weight change.  HENT:  Negative for sore throat and trouble swallowing.   Eyes:  Negative for visual disturbance.  Respiratory:  Positive for shortness of breath. Negative for cough, chest tightness and wheezing.   Cardiovascular:  Negative for chest pain and leg swelling.  Gastrointestinal:  Negative for abdominal distention and abdominal pain.  Genitourinary:  Negative for dysuria and hematuria.  Musculoskeletal:  Positive for gait problem.  Skin:  Negative for wound.  Neurological:  Negative for dizziness and headaches.  Psychiatric/Behavioral:  Negative for confusion and dysphoric mood. The patient is not nervous/anxious.     Immunization History  Administered Date(s) Administered   Fluad Quad(high Dose 65+) 02/05/2022, 01/08/2023   Fluzone Influenza virus vaccine,trivalent (IIV3), split virus 04/30/2010, 01/26/2016, 02/07/2018, 02/01/2020   INFLUENZA, HIGH DOSE SEASONAL PF 02/07/2018, 01/25/2019, 02/28/2021, 02/05/2022, 01/13/2024   Influenza Split 01/16/2009   Influenza,inj,Quad PF,6+ Mos 03/06/2012, 01/26/2016, 01/10/2017   Influenza,inj,quad, With Preservative 02/14/2015   Moderna Covid-19 Fall Seasonal Vaccine 69yrs & older 01/13/2024   Moderna Covid-19 Vaccine Bivalent Booster 63yrs & up 02/28/2021, 02/04/2023   PFIZER(Purple Top)SARS-COV-2 Vaccination 05/06/2019, 05/27/2019, 01/27/2020, 09/18/2020   Pneumococcal Conjugate-13 03/19/2017   Pneumococcal Polysaccharide-23 02/14/2015   Respiratory Syncytial Virus Vaccine,Recomb Aduvanted(Arexvy) 02/15/2022   Td 02/25/2005   Tdap 05/19/2013   Zoster, Live 07/28/2006   Pertinent  Health Maintenance Due  Topic Date Due    Influenza Vaccine  Completed   Mammogram  Discontinued   Bone Density Scan  Discontinued      11/26/2022   12:34 PM 12/31/2022    3:26 PM 03/11/2023    2:48 PM 12/16/2023    3:28 PM 03/02/2024    3:02 PM  Fall Risk  Falls in the past year? 0 0 1 0 0  Was there an injury with Fall? 0  0  0  0  0   Fall Risk Category Calculator 0 0 1 0 0  Patient at Risk for Falls Due to History of fall(s);Impaired balance/gait History of fall(s);Impaired balance/gait;Impaired mobility History of fall(s);Impaired balance/gait;Impaired mobility History of fall(s);Impaired balance/gait;Impaired mobility Impaired balance/gait  Fall risk Follow  up Falls evaluation completed;Education provided Falls evaluation completed;Education provided;Falls prevention discussed Falls evaluation completed;Education provided;Falls prevention discussed Falls evaluation completed Falls evaluation completed     Data saved with a previous flowsheet row definition   Functional Status Survey:    Vitals:   04/27/24 1400  BP: (!) 140/70  Pulse: (!) 55  Resp: 17  Temp: (!) 96.8 F (36 C)  SpO2: 92%  Weight: 86 lb (39 kg)  Height: 5' 5 (1.651 m)   Body mass index is 14.31 kg/m. Physical Exam Vitals reviewed.  Constitutional:      General: She is not in acute distress.    Appearance: She is underweight.  HENT:     Head: Normocephalic.  Eyes:     General:        Right eye: No discharge.        Left eye: No discharge.  Cardiovascular:     Rate and Rhythm: Normal rate. Rhythm irregular.     Pulses: Normal pulses.     Heart sounds: Normal heart sounds.     Comments: Apical pulse 106 Pulmonary:     Effort: Pulmonary effort is normal. No respiratory distress.     Breath sounds: Examination of the right-middle field reveals rales. Examination of the left-middle field reveals rales. Rales present. No wheezing.  Abdominal:     General: Bowel sounds are normal. There is no distension.     Palpations: Abdomen is soft.      Tenderness: There is no abdominal tenderness.  Musculoskeletal:     Cervical back: Neck supple.     Right lower leg: No edema.     Left lower leg: No edema.  Skin:    General: Skin is warm.     Capillary Refill: Capillary refill takes less than 2 seconds.  Neurological:     General: No focal deficit present.     Mental Status: She is alert and oriented to person, place, and time.     Motor: Weakness present.     Gait: Gait abnormal.     Comments: Right sided hemiplegia  Psychiatric:        Mood and Affect: Mood normal.     Labs reviewed: Recent Labs    03/06/24 0134 04/15/24 2340 04/16/24 0615  NA 146* 143 144  K 3.6 4.1 4.3  CL 106 103 103  CO2 26 28 27   GLUCOSE 96 102* 123*  BUN 25* 31* 34*  CREATININE 0.80 1.24* 1.23*  CALCIUM  8.8* 8.9 8.5*   Recent Labs    06/24/23 0000 02/18/24 0900 04/15/24 2340  AST 30 22 27   ALT 30 17 13   ALKPHOS 95 64 75  BILITOT  --   --  0.4  PROT  --   --  6.9  ALBUMIN  3.0* 3.7 3.9   Recent Labs    02/18/24 0900 03/06/24 0134 04/15/24 2340 04/16/24 0615  WBC 7.1 7.1 9.2 7.6  NEUTROABS 4.30 4.5 6.8  --   HGB 13.5 12.5 13.9 13.1  HCT 42 40.1 44.3 41.4  MCV  --  95.5 96.5 95.4  PLT 234 238 270 269   Lab Results  Component Value Date   TSH 0.76 02/18/2024   Lab Results  Component Value Date   HGBA1C 6.0 (H) 11/13/2020   Lab Results  Component Value Date   CHOL 182 02/18/2024   HDL 78 (A) 02/18/2024   LDLCALC 85 02/18/2024   TRIG 96 02/18/2024   CHOLHDL 3.0 11/13/2020    Significant  Diagnostic Results in last 30 days:  CT Chest Wo Contrast Result Date: 04/24/2024 EXAM: CT CHEST WITHOUT CONTRAST 04/12/2024 01:56:44 PM TECHNIQUE: CT of the chest was performed without the administration of intravenous contrast. Multiplanar reformatted images are provided for review. Automated exposure control, iterative reconstruction, and/or weight based adjustment of the mA/kV was utilized to reduce the radiation dose to as low  as reasonably achievable. COMPARISON: None available. CLINICAL HISTORY: Right lung opacity. FINDINGS: MEDIASTINUM: Heart: Cardiomegaly. 4-vessel coronary artery calcification. Pericardium is unremarkable. The central airways are clear. Vessels: The main pulmonary artery is enlarged in caliber measuring up to 4 cm with suggestion of pulmonary hypertension. The ascending thoracic aorta is stable in size and aneurysmal measuring up to 4.3 cm. The descending thoracic aorta may be normal in caliber. There is severe atherosclerotic plaque. Esophagus: Patulous esophagus. Thyroid : Unremarkable. LYMPH NODES: No gross hilar adenopathy with limited evaluation on this noncontrast study. Stable to slightly increased in size 1.9 cm precarinal lymph node. LUNGS AND PLEURA: Slightly improved right lower lobe peribronchial consolidation in the setting of bronchiectasis. Severe emphysematous changes. No pulmonary edema. No pleural effusion or pneumothorax. SOFT TISSUES/BONES: Soft tissues: Bilateral calcified breast implants. Bones: Degenerative changes of the bilateral shoulders. Diffusely decreased bone density. Multilevel vertebral body height loss of the visualized thoracolumbar spine as well as chronic compression fractures with exaggerated kyphotic curvature. These appear grossly stable. UPPER ABDOMEN: Limited images of the upper abdomen demonstrates no acute abnormality. IMPRESSION: 1. Slightly improved right lower lobe peribronchial consolidation in the setting of bronchiectasis. 2. Stable to slightly increased 1.9 cm precarinal lymph node.No gross hilar adenopathy with limited evaluation on this noncontrast study. 3. Severe emphysema; pulmonary emphysema is an independent risk factor for lung cancer, and recommend consideration for evaluation for a low-dose CT lung cancer screening program. 4. Enlarged main pulmonary artery, suggesting pulmonary hypertension. 5. 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 6. Cardiomegaly. 7. Severe atherosclerotic plaque with 4-vessel coronary artery calcification. Electronically signed by: Morgane Naveau MD MD 04/24/2024 02:39 AM EST RP Workstation: [id number]   CT ABDOMEN PELVIS W WO CONTRAST Result Date: 04/16/2024 CLINICAL DATA:  Gross hematuria. EXAM: CT ABDOMEN AND PELVIS WITHOUT AND WITH CONTRAST TECHNIQUE: Multidetector CT imaging of the abdomen and pelvis was performed following the standard protocol before and following the bolus administration of intravenous contrast. RADIATION DOSE REDUCTION: This exam was performed according to the departmental dose-optimization program which includes automated exposure control, adjustment of the mA and/or kV according to patient size and/or use of iterative reconstruction technique. CONTRAST:  100mL ISOVUE -300 IOPAMIDOL  (ISOVUE -300) INJECTION 61% COMPARISON:  11/25/2020 FINDINGS: Lower Chest: No acute findings. Hepatobiliary: No suspicious hepatic masses identified. Layering sludge or tiny gallstones seen, without No evidence of cholecystitis or biliary ductal dilatation. Pancreas:  No mass or inflammatory changes. Spleen: Within normal limits in size and appearance. Adrenals/Urinary Tract: No adrenal masses identified. Tiny bilateral renal calculi as well as vascular calcifications again noted. A 5 mm calculus is also seen along the right posterior bladder wall. No evidence of ureteral calculi or hydronephrosis. Right upper pole renal parenchymal scarring noted. No suspicious renal masses identified. No masses seen involving the collecting systems, ureters, or bladder. Stomach/Bowel: No evidence of obstruction, inflammatory process or abnormal fluid collections. Sigmoid diverticulosis noted, without signs of diverticulitis. Large stool burden seen throughout the colon. Vascular/Lymphatic: No pathologically enlarged lymph nodes. No acute vascular findings. Severe aortic iliac atherosclerotic  calcification again noted. Reproductive:  No  mass or other significant abnormality. Other:  None. Musculoskeletal: No suspicious bone lesions identified. Old vertebral body compression fractures are again seen at T11, L1, and L3. Prior fusion noted at L4-5. IMPRESSION: Small nonobstructing bilateral renal calculi and small bladder calculus. No evidence of ureteral calculi or hydronephrosis. No radiographic evidence of urinary tract neoplasm. Cholelithiasis versus gallbladder sludge. No radiographic evidence of cholecystitis. Colonic diverticulosis, without radiographic evidence of diverticulitis. Large stool burden noted; recommend clinical correlation for possible constipation. Electronically Signed   By: Norleen DELENA Kil M.D.   On: 04/16/2024 21:59   ECHOCARDIOGRAM COMPLETE Result Date: 04/16/2024    ECHOCARDIOGRAM REPORT   Patient Name:   Beverly Li Date of Exam: 04/16/2024 Medical Rec #:  [id number]       Height:       65.0 in Accession #:    [id number]      Weight:       91.0 lb Date of Birth:  01/08/1946       BSA:          1.416 m Patient Age:    78 years        BP:           116/67 mmHg Patient Gender: F               HR:           75 bpm. Exam Location:  Inpatient Procedure: 2D Echo, Color Doppler and Cardiac Doppler (Both Spectral and Color            Flow Doppler were utilized during procedure). Indications:    CHF [id number]  History:        Patient has prior history of Echocardiogram examinations, most                 recent 07/08/2021. Risk Factors:Hypertension.  Sonographer:    Nathanel Devonshire Referring Phys: [id number] JAN A MANSY IMPRESSIONS  1. Left ventricular ejection fraction, by estimation, is 70 to 75%. The left ventricle has hyperdynamic function. The left ventricle has no regional wall motion abnormalities. There is moderate concentric left ventricular hypertrophy. Left ventricular diastolic parameters are consistent with Grade I diastolic dysfunction (impaired relaxation).  2. Right ventricular  systolic function is normal. The right ventricular size is normal. There is normal pulmonary artery systolic pressure.  3. Left atrial size was mildly dilated.  4. The mitral valve is normal in structure. No evidence of mitral valve regurgitation. No evidence of mitral stenosis.  5. The aortic valve is tricuspid. Aortic valve regurgitation is not visualized. No aortic stenosis is present.  6. The inferior vena cava is normal in size with greater than 50% respiratory variability, suggesting right atrial pressure of 3 mmHg. FINDINGS  Left Ventricle: Left ventricular ejection fraction, by estimation, is 70 to 75%. The left ventricle has hyperdynamic function. The left ventricle has no regional wall motion abnormalities. The left ventricular internal cavity size was normal in size. There is moderate concentric left ventricular hypertrophy. Left ventricular diastolic parameters are consistent with Grade I diastolic dysfunction (impaired relaxation). Right Ventricle: The right ventricular size is normal. No increase in right ventricular wall thickness. Right ventricular systolic function is normal. There is normal pulmonary artery systolic pressure. The tricuspid regurgitant velocity is 1.91 m/s, and  with an assumed right atrial pressure of 3 mmHg, the estimated right ventricular systolic pressure is 17.6 mmHg. Left Atrium: Left atrial size was mildly dilated. Right Atrium: Right atrial size was normal in size.  Pericardium: There is no evidence of pericardial effusion. Mitral Valve: The mitral valve is normal in structure. No evidence of mitral valve regurgitation. No evidence of mitral valve stenosis. MV peak gradient, 6.2 mmHg. The mean mitral valve gradient is 2.0 mmHg. Tricuspid Valve: The tricuspid valve is normal in structure. Tricuspid valve regurgitation is mild . No evidence of tricuspid stenosis. Aortic Valve: The aortic valve is tricuspid. Aortic valve regurgitation is not visualized. No aortic stenosis is  present. Aortic valve mean gradient measures 6.0 mmHg. Aortic valve peak gradient measures 12.5 mmHg. Aortic valve area, by VTI measures 3.01  cm. Pulmonic Valve: The pulmonic valve was normal in structure. Pulmonic valve regurgitation is not visualized. No evidence of pulmonic stenosis. Aorta: The aortic root is normal in size and structure. Venous: The inferior vena cava is normal in size with greater than 50% respiratory variability, suggesting right atrial pressure of 3 mmHg. IAS/Shunts: The interatrial septum was not well visualized.  LEFT VENTRICLE PLAX 2D LVIDd:         3.80 cm LVIDs:         2.60 cm LV PW:         1.00 cm LV IVS:        0.80 cm LVOT diam:     1.90 cm LV SV:         83 LV SV Index:   59 LVOT Area:     2.84 cm LV IVRT:       71 msec  LV Volumes (MOD) LV vol d, MOD A2C: 38.2 ml LV vol d, MOD A4C: 41.1 ml LV vol s, MOD A2C: 13.0 ml LV vol s, MOD A4C: 16.3 ml LV SV MOD A2C:     25.2 ml LV SV MOD A4C:     41.1 ml LV SV MOD BP:      25.4 ml RIGHT VENTRICLE             IVC RV Basal diam:  2.40 cm     IVC diam: 2.00 cm RV S prime:     10.80 cm/s TAPSE (M-mode): 1.7 cm LEFT ATRIUM             Index LA diam:        3.30 cm 2.33 cm/m LA Vol (A2C):   37.4 ml 26.42 ml/m LA Vol (A4C):   50.8 ml 35.88 ml/m LA Biplane Vol: 47.0 ml 33.20 ml/m  AORTIC VALVE                     PULMONIC VALVE AV Area (Vmax):    3.11 cm      PV Vmax:       1.02 m/s AV Area (Vmean):   2.91 cm      PV Peak grad:  4.2 mmHg AV Area (VTI):     3.01 cm AV Vmax:           177.00 cm/s AV Vmean:          107.000 cm/s AV VTI:            0.276 m AV Peak Grad:      12.5 mmHg AV Mean Grad:      6.0 mmHg LVOT Vmax:         194.00 cm/s LVOT Vmean:        110.000 cm/s LVOT VTI:          0.293 m LVOT/AV VTI ratio: 1.06  AORTA Ao Root diam: 3.30 cm MITRAL  VALVE             TRICUSPID VALVE MV Area VTI:  3.82 cm   TR Peak grad:   14.6 mmHg MV Peak grad: 6.2 mmHg   TR Vmax:        191.00 cm/s MV Mean grad: 2.0 mmHg MV Vmax:      1.24 m/s    SHUNTS MV Vmean:     64.8 cm/s  Systemic VTI:  0.29 m                          Systemic Diam: 1.90 cm Morene Brownie Electronically signed by Morene Brownie Signature Date/Time: 04/16/2024/4:22:27 PM    Final    DG Chest Port 1 View Result Date: 04/16/2024 EXAM: 1 VIEW(S) XRAY OF THE CHEST 04/16/2024 12:04:00 AM COMPARISON: 06/18/2023 CLINICAL HISTORY: SOB FINDINGS: LUNGS AND PLEURA: Eventration of the right hemidiaphragm again noted, better visualized on a CT examination of 04/12/2024. No focal pulmonary opacity. No pleural effusion. No pneumothorax. HEART AND MEDIASTINUM: Cardiomegaly. Tortuous, aneurysmal thoracic aorta, accentuated by semi-erect positioning. BONES AND SOFT TISSUES: Calcified right breast implant noted. No acute osseous abnormality. IMPRESSION: 1. Cardiomegaly. 2. 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 Electronically signed by: Dorethia Molt MD 04/16/2024 12:42 AM EST RP Workstation: [id number]    Assessment/Plan: 1. SOB (shortness of breath) (Primary) - onset x 1 day, with exertion - rales to middle lobes  - also tachycardia - cont oxygen  2-3 liters - cont albuterol  prn, stiolto and duonebs prn - CXR, cbc/diff  2. PAF (paroxysmal atrial fibrillation) (HCC) - followed by afib clinic> amiodarone  increased 200 mg 01/12 - in house EKG notes HR 106, no RVR> will fax to afib clinic - cont amiodarone  and eliquis   3. Chronic diastolic congestive heart failure (HCC) - recently hospitalized> BNP was 2700 - now on torsemide  - rales on exam - no cough or edema - current weight 86 lbs> was 90 lbs at hospital discharge  - stat BNP, BMP - start weekly weights  4. Moderate protein malnutrition - BMI 14.31 - albumin  3.9, protein 6.9 04/15/2024 - start weekly weights    Family/ staff Communication: plan discussed with patient and nurse  Labs/tests ordered:  stat EKG, cxr, cbc/diff, bmp,bnp      [1]  Allergies Allergen Reactions    Amoxicillin Other (See Comments)    UTI Has patient had a PCN reaction causing immediate rash, facial/tongue/throat swelling, SOB or lightheadedness with hypotension: No Has patient had a PCN reaction causing severe rash involving mucus membranes or skin necrosis: No Has patient had a PCN reaction that required hospitalization: No Has patient had a PCN reaction occurring within the last 10 years: Yes--UTI ONLY If all of the above answers are NO, then may proceed with Cephalosporin use.    Atenolol Cough   Crestor  [Rosuvastatin  Calcium ] Other (See Comments)    Muscle aches   Pravastatin  Other (See Comments)    Muscle aches   Sulfa Antibiotics Nausea Only   Codeine Other (See Comments)    hallucinations   "

## 2024-04-28 ENCOUNTER — Telehealth: Payer: Self-pay | Admitting: Neurology

## 2024-04-28 ENCOUNTER — Telehealth: Payer: Self-pay | Admitting: Adult Health

## 2024-04-28 MED ORDER — POTASSIUM CHLORIDE CRYS ER 20 MEQ PO TBCR: 20.0000 meq | EXTENDED_RELEASE_TABLET | Freq: Every day | ORAL | Status: DC

## 2024-04-28 MED ORDER — LEVALBUTEROL TARTRATE 45 MCG/ACT IN AERO: 2.0000 | INHALATION_SPRAY | Freq: Four times a day (QID) | RESPIRATORY_TRACT | Status: AC | PRN

## 2024-04-28 MED ORDER — LEVALBUTEROL HCL 0.63 MG/3ML IN NEBU: 0.6300 mg | INHALATION_SOLUTION | Freq: Four times a day (QID) | RESPIRATORY_TRACT | Status: DC | PRN

## 2024-04-28 NOTE — Telephone Encounter (Signed)
 Submitted auth request via CMM to transition pt to SP, status is pending. Key: [id number]

## 2024-04-28 NOTE — Telephone Encounter (Signed)
 K returned at 3.3 On torsemide  Added daily Kdur  Due to tachycardia albuterol  neb and MDI were changed to xopenex   BNP 606 which is chronically elevated. Pro BNP 2900 04/15/24  CXR with no acute findings, some inflammatory changes noted.   Vitals stable, follow up with cardiology regarding tachycardia scheduled.

## 2024-04-30 ENCOUNTER — Ambulatory Visit: Payer: Self-pay | Admitting: Emergency Medicine

## 2024-04-30 ENCOUNTER — Ambulatory Visit (HOSPITAL_COMMUNITY): Admission: RE | Admit: 2024-04-30 | Source: Ambulatory Visit | Admitting: Internal Medicine

## 2024-04-30 ENCOUNTER — Non-Acute Institutional Stay (SKILLED_NURSING_FACILITY): Payer: Self-pay | Admitting: Adult Health

## 2024-04-30 DIAGNOSIS — J439 Emphysema, unspecified: Secondary | ICD-10-CM

## 2024-04-30 DIAGNOSIS — R0602 Shortness of breath: Secondary | ICD-10-CM | POA: Diagnosis not present

## 2024-04-30 DIAGNOSIS — J9611 Chronic respiratory failure with hypoxia: Secondary | ICD-10-CM

## 2024-04-30 NOTE — Progress Notes (Incomplete)
 "   Primary Care Physician: [name], MD Primary Cardiologist: Oneil Parchment, MD Electrophysiologist: Elspeth Sage, MD (Inactive)     Referring Physician: Dr. Shlomo Cathlean KATHEE Beverly Li is a 79 y.o. female with a history of labile hypertension, hyperlipidemia, HOCM, CAD, CVA, and atrial fibrillation who presents for consultation in the [facility].  ED visit on 03/06/2024 for hypertension.  PCP at [facility] facility increased amlodipine  to 5 mg daily and hydralazine  to 20 mg 3 times daily.  Faxed ECG appears to show A-fib.  Patient is taking amiodarone  100 mg daily.  Patient is on Eliquis  for stroke prevention.  On evaluation today, patient is currently in junctional rhythm.  Since amlodipine  was increased, patient has noticed blood pressure readings have now been normal.  She has not needed to take hydralazine  yet.  No missed doses of Eliquis .  Follow-up 04/30/2024.  Patient is currently in A-fib.  Most recent hospital admission around the new year for acute on chronic congestive heart failure; patient was noted to be in A-fib.  Nursing facility contacted office on 1/12 noting patient has shortness of breath with heart rate 120.  Amiodarone  was increased to 200 mg once daily.  No missed doses of Eliquis .  Today, she denies symptoms of palpitations, chest pain, shortness of breath, orthopnea, PND, lower extremity edema, dizziness, presyncope, syncope, bleeding, or neurologic sequela. The patient is tolerating medications without difficulties and is otherwise without complaint today.   she has a BMI of There is no height or weight on file to calculate BMI.. There were no vitals filed for this visit.   Current Outpatient Medications  Medication Sig Dispense Refill   acetaminophen  (TYLENOL ) 325 MG tablet Take 650 mg by mouth every 4 (four) hours as needed.     amiodarone  (PACERONE ) 200 MG tablet Take 1 tablet (200 mg total) by mouth daily.     amLODipine   (NORVASC ) 2.5 MG tablet Take 2.5 mg by mouth. Give 5 mg in AM and 2.5 mg in PM     apixaban  (ELIQUIS ) 5 MG TABS tablet Take 5 mg by mouth 2 (two) times daily. May give crushed     Benzonatate  (TESSALON  PERLES PO) Take 200 mg by mouth 3 (three) times daily as needed (for cough).     buPROPion  (WELLBUTRIN  XL) 150 MG 24 hr tablet Take 150 mg by mouth daily.     Calcium -Vitamin D -Vitamin K  (VIACTIV CALCIUM  PLUS D) 650-12.5-40 MG-MCG-MCG CHEW Chew 1 tablet by mouth daily.     Cholecalciferol  (VITAMIN D3) 20 MCG (800 UNIT) TABS Take 1 tablet by mouth daily.     cloNIDine  (CATAPRES ) 0.1 MG tablet Take 0.1 mg by mouth daily as needed (for sBP above 170).     denosumab (PROLIA) 60 MG/ML SOSY injection Inject 60 mg into the skin every 6 (six) months.     Eyelid Cleansers (OCUSOFT EYELID CLEANSING) PADS Apply 1 Application topically daily.     ezetimibe  (ZETIA ) 10 MG tablet Take 10 mg by mouth daily. Oral if crushed     hydrALAZINE  (APRESOLINE ) 10 MG tablet Take 10 mg by mouth daily as needed (for sBP above 170).     hydrALAZINE  (APRESOLINE ) 10 MG tablet Take 2 tablets (20 mg total) by mouth 3 (three) times daily.     levalbuterol  (XOPENEX  HFA) 45 MCG/ACT inhaler Inhale 2 puffs into the lungs every 6 (six) hours as needed for wheezing.     levalbuterol  (XOPENEX ) 0.63 MG/3ML nebulizer solution  Take 3 mLs (0.63 mg total) by nebulization every 6 (six) hours as needed for wheezing or shortness of breath.     mirtazapine  (REMERON ) 7.5 MG tablet Take 1 tablet (7.5 mg total) by mouth at bedtime.     OXYGEN  Inhale 3 L into the lungs as directed. to maintain sats > or equal to 90%. May titrate Three Times A Day; 07:00 AM - 03:00 PM, 03:00 PM - 11:00 PM, 11:00 PM - 7:00 am     pantoprazole  (PROTONIX ) 40 MG tablet Take 40 mg by mouth 2 (two) times daily.     Polyethyl Glycol-Propyl Glycol (SYSTANE) 0.4-0.3 % SOLN Apply 2 drops to eye every 6 (six) hours as needed (dry eyes).     polyethylene glycol powder  (GLYCOLAX /MIRALAX ) 17 GM/SCOOP powder Take 17 g by mouth every other day.     potassium chloride  SA (KLOR-CON  M) 20 MEQ tablet Take 1 tablet (20 mEq total) by mouth daily.     sennosides-docusate sodium  (SENOKOT-S) 8.6-50 MG tablet Take 2 tablets by mouth 2 (two) times daily.     Tiotropium Bromide-Olodaterol (STIOLTO RESPIMAT ) 2.5-2.5 MCG/ACT AERS Inhale 2 puffs into the lungs daily.     torsemide  (DEMADEX ) 20 MG tablet Take 1 tablet (20 mg total) by mouth daily. 30 tablet 0   No current facility-administered medications for this visit.    Atrial Fibrillation Management history:  Previous antiarrhythmic drugs: Tikosyn , amiodarone  Previous cardioversions: None Previous ablations: None Anticoagulation history: Eliquis    ROS- All systems are reviewed and negative except as per the HPI above.  Physical Exam: LMP  (LMP Unknown)   GEN- The patient is well appearing, alert and oriented x 3 today.   Neck - no JVD or carotid bruit noted Lungs- Clear to ausculation bilaterally, normal work of breathing Heart- ***Regular rate and rhythm, no murmurs, rubs or gallops, PMI not laterally displaced Extremities- no clubbing, cyanosis, or edema Skin - no rash or ecchymosis noted   EKG today demonstrates  ***   Echo 07/08/2021 demonstrated  1. Left ventricular ejection fraction, by estimation, is 60 to 65%. Left  ventricular ejection fraction by 3D volume is 65 %. The left ventricle has  normal function. The left ventricle has no regional wall motion  abnormalities. There is moderate  asymmetric left ventricular hypertrophy of the basal-septal segment. Left  ventricular diastolic parameters are consistent with Grade II diastolic  dysfunction (pseudonormalization). Elevated left atrial pressure.   2. Right ventricular systolic function is normal. The right ventricular  size is normal. There is normal pulmonary artery systolic pressure. The  estimated right ventricular systolic pressure is  21.8 mmHg.   3. Left atrial size was moderately dilated.   4. The mitral valve is degenerative. Trivial mitral valve regurgitation.  No evidence of mitral stenosis. Moderate mitral annular calcification.   5. The aortic valve is tricuspid. Aortic valve regurgitation is moderate.  Aortic valve sclerosis/calcification is present, without any evidence of  aortic stenosis.   6. Pulmonic valve regurgitation is mild to moderate.   7. The inferior vena cava is normal in size with greater than 50%  respiratory variability, suggesting right atrial pressure of 3 mmHg.   ASSESSMENT & PLAN CHA2DS2-VASc Score = 7  The patient's score is based upon: CHF History: 0 HTN History: 1 Diabetes History: 0 Stroke History: 2 Vascular Disease History: 1 Age Score: 2 Gender Score: 1       ASSESSMENT AND PLAN: Paroxysmal Atrial Fibrillation (ICD10:  I48.0) The patient's CHA2DS2-VASc score  is 7, indicating a 11.2% annual risk of stroke.    Patient is currently in A-fib.  Reload amiodarone ?   Secondary Hypercoagulable State (ICD10:  D68.69) The patient is at significant risk for stroke/thromboembolism based upon her CHA2DS2-VASc Score of 7.  Continue Apixaban  (Eliquis ).  Continue Eliquis  5 mg twice daily.  Dosing is correct based on age below 67 years old and creatinine less than 1.5.  Hypertension Stable today.   Follow up ***.   Terra Pac, [facility]  Afib Clinic [address] [place], [place] [address] [phone]  "

## 2024-05-01 ENCOUNTER — Encounter: Payer: Self-pay | Admitting: Adult Health

## 2024-05-01 MED ORDER — PREDNISONE 20 MG PO TABS: 20.0000 mg | ORAL_TABLET | Freq: Every day | ORAL | Status: AC

## 2024-05-01 NOTE — Progress Notes (Signed)
 " Location:  Medical Illustrator of Service:  SNF (31) Provider:   Bari America, ANP [facility] [phone]   [name], MD  Patient Care Team: [name], MD as PCP - General (Internal Medicine) Jeffrie Oneil BROCKS, MD as PCP - Cardiology (Cardiology) [name], MD (Inactive) as PCP - Electrophysiology (Cardiology) Alix Charleston, MD as Consulting Physician (Neurosurgery)  Extended Emergency Contact Information Primary Emergency Contact: Bruhn,[name] Address: [address]          [place] [phone] [place] Home Phone: [phone] Mobile Phone: [phone] Relation: Spouse Secondary Emergency Contact: Antosh,[name] Address: [address]          [place], [place] [address] [place] Mobile Phone: [phone] Relation: Son  Code Status:  DNR Goals of care: Advanced Directive information    04/15/2024   11:35 PM  Advanced Directives  Does Patient Have a Medical Advance Directive? No  Would patient like information on creating a medical advance directive? No - Patient declined     Chief Complaint  Patient presents with   Acute Visit    Shortness of breath     HPI:  Pt is a 79 y.o. female seen today for an acute visit for shortness of breath and hypoxia.   Resides in skilled care with a hx of COPD with oxygen  dependency, CHF, CVA, afib , PAD, CAD   The nurse at [facility] reported that Ms. Chicoine was having shortness of breath this morning and required an increase in her oxygen  to 4 liters to maintain sats at 90 or greater. She received xopenex  and with the oxygen  increase she feels better but through the course of this week she has had several episodes like this which come and go and can occur at rest and with activity.   Recently changed her code status to a DNR  Was offered to go to the ER today but declined.    Seen on 1/13 for sob CXR showed nonspecific perihilar  inflammation and cardiomegaly BNP 606 CBC WBC 9.4 Hgb 11.4 BMP BUN 18 Cr 0.8 K 3.3 NA 143 C02 38  She was started on torsemide  after an ED visit which revealed CHF with a pro BNP of 2700 04/15/24 Her weight is down 4 lbs, no edema.  Wt Readings from Last 3 Encounters:  05/01/24 86 lb (39 kg)  04/27/24 86 lb (39 kg)  04/17/24 90 lb 6.2 oz (41 kg)    Also she has issue with tachycardia due to afib and the amiodarone  was increase to 200 mg Her HR improved now 80. ON eliquis .   She was supposed to go to the Afib clinic today but did not feel well enough.   Also followed by pulmonary recent CT of the chest slightly improved right LL peribronchial consolidation in the setting of bronchiectasis, stable to slightly increased 1.9 cm precarinal lymph node with no gross adenopathy. Severe emphysema, pulmonary htn, CM,  and severe plaque with 4 vessel coronary artery calcification   2 D echo 70-75% EF with hyperdynamic function and grade 1 DD normal pulmonary artery pressure, normal RV size  Past Medical History:  Diagnosis Date   Anxiety    Arthritis    some in my lower back; probably elbows, knees (11/18/2017)   Atrial fibrillation (HCC)    Bell's palsy    when pt. was 79 yrs old, when under stress the left side of face will  droop.   Complication of anesthesia    vascular OR 2016; BP bottomed out; couldn't get it regulated; ended up in ICU for DAYS (11/18/2017)   GERD (gastroesophageal reflux disease)    History of kidney stones    Hypertension    Hypertrophic cardiomyopathy (HCC)    severe LV basilar hypertrophy witn no evidence of significant outflow tract obstruction, EF 65-70%, mild LAE, mild TR, grade 1a diastolic dysfunction 05/15/10 (Dr. Oneil Parchment) (Atrial Septal Hypertrophy pattern)-- Intra-op TEE with dsignificant outflow tract obstruction - AI, MR & TR   Insomnia    Mild aortic sclerosis    Osteopenia    Peripheral vascular disease    Syncope    , Vagal   Past Surgical  History:  Procedure Laterality Date   AUGMENTATION MAMMAPLASTY Bilateral    BACK SURGERY     CARDIAC CATHETERIZATION N/A 05/07/2016   Procedure: Left Heart Cath and Coronary Angiography;  Surgeon: Lonni JONETTA Cash, MD;  Location: [facility] INVASIVE CV LAB;  Service: Cardiovascular;  Laterality: N/A;   CARDIOVERSION N/A 09/24/2017   Procedure: CARDIOVERSION;  Surgeon: Rolan Ezra RAMAN, MD;  Location: [facility] ENDOSCOPY;  Service: Cardiovascular;  Laterality: N/A;   DILATION AND CURETTAGE OF UTERUS     ENDARTERECTOMY FEMORAL Right 03/02/2015   Procedure: ENDARTERECTOMY RIGHT FEMORAL;  Surgeon: Lonni RAMAN Blade, MD;  Location: [facility] OR;  Service: Vascular;  Laterality: Right;   ESOPHAGOGASTRODUODENOSCOPY (EGD) WITH PROPOFOL  N/A 12/01/2020   Procedure: ESOPHAGOGASTRODUODENOSCOPY (EGD) WITH PROPOFOL ;  Surgeon: Paola Dreama SAILOR, MD;  Location: [facility] ENDOSCOPY;  Service: General;  Laterality: N/A;   FACIAL COSMETIC SURGERY Left 2002   related to Bell's Palsy @ age 89; left eye/side of face droopy; tried to make area symmetrical   FEMORAL-POPLITEAL BYPASS GRAFT Right 03/02/2015   Procedure: BYPASS GRAFT FEMORAL-BELOW KNEE POPLITEAL ARTERY;  Surgeon: Lonni RAMAN Blade, MD;  Location: [facility] OR;  Service: Vascular;  Laterality: Right;   INGUINAL HERNIA REPAIR Bilateral 2002   IR ANGIO INTRA EXTRACRAN SEL COM CAROTID INNOMINATE UNI L MOD SED  11/16/2020   IR CT HEAD LTD  11/13/2020   IR PERCUTANEOUS ART THROMBECTOMY/INFUSION INTRACRANIAL INC DIAG ANGIO  11/13/2020   OVARIAN CYST REMOVAL Left    PEG PLACEMENT N/A 12/01/2020   Procedure: PERCUTANEOUS ENDOSCOPIC GASTROSTOMY (PEG) PLACEMENT;  Surgeon: Paola Dreama SAILOR, MD;  Location: [facility] ENDOSCOPY;  Service: General;  Laterality: N/A;   PERIPHERAL VASCULAR CATHETERIZATION N/A 01/16/2015   Procedure: Abdominal Aortogram;  Surgeon: Lonni RAMAN Blade, MD;  Location: [facility] INVASIVE CV LAB;  Service: Cardiovascular;  Laterality: N/A;   POSTERIOR LUMBAR FUSION  2015   have  plates and screws in there   RADIOLOGY WITH ANESTHESIA N/A 11/13/2020   Procedure: IR WITH ANESTHESIA;  Surgeon: Radiologist, Medication, MD;  Location: [facility] OR;  Service: Radiology;  Laterality: N/A;   TONSILLECTOMY      Allergies[1]  Outpatient Encounter Medications as of 04/30/2024  Medication Sig   predniSONE  (DELTASONE ) 20 MG tablet Take 1 tablet (20 mg total) by mouth daily with breakfast for 5 days.   acetaminophen  (TYLENOL ) 325 MG tablet Take 650 mg by mouth every 4 (four) hours as needed.   amiodarone  (PACERONE ) 200 MG tablet Take 1 tablet (200 mg total) by mouth daily.   amLODipine  (NORVASC ) 2.5 MG tablet Take 2.5 mg by mouth. Give 5 mg in AM and 2.5 mg in PM   apixaban  (ELIQUIS ) 5 MG TABS tablet Take 5 mg by mouth 2 (two) times daily. May give crushed  Benzonatate  (TESSALON  PERLES PO) Take 200 mg by mouth 3 (three) times daily as needed (for cough).   buPROPion  (WELLBUTRIN  XL) 150 MG 24 hr tablet Take 150 mg by mouth daily.   Calcium -Vitamin D -Vitamin K  (VIACTIV CALCIUM  PLUS D) 650-12.5-40 MG-MCG-MCG CHEW Chew 1 tablet by mouth daily.   Cholecalciferol  (VITAMIN D3) 20 MCG (800 UNIT) TABS Take 1 tablet by mouth daily.   cloNIDine  (CATAPRES ) 0.1 MG tablet Take 0.1 mg by mouth daily as needed (for sBP above 170).   denosumab (PROLIA) 60 MG/ML SOSY injection Inject 60 mg into the skin every 6 (six) months.   Eyelid Cleansers (OCUSOFT EYELID CLEANSING) PADS Apply 1 Application topically daily.   ezetimibe  (ZETIA ) 10 MG tablet Take 10 mg by mouth daily. Oral if crushed   hydrALAZINE  (APRESOLINE ) 10 MG tablet Take 10 mg by mouth daily as needed (for sBP above 170).   hydrALAZINE  (APRESOLINE ) 10 MG tablet Take 2 tablets (20 mg total) by mouth 3 (three) times daily.   levalbuterol  (XOPENEX  HFA) 45 MCG/ACT inhaler Inhale 2 puffs into the lungs every 6 (six) hours as needed for wheezing.   levalbuterol  (XOPENEX ) 0.63 MG/3ML nebulizer solution Take 3 mLs (0.63 mg total) by nebulization every 6  (six) hours as needed for wheezing or shortness of breath.   mirtazapine  (REMERON ) 7.5 MG tablet Take 1 tablet (7.5 mg total) by mouth at bedtime.   OXYGEN  Inhale 3 L into the lungs as directed. to maintain sats > or equal to 90%. May titrate Three Times A Day; 07:00 AM - 03:00 PM, 03:00 PM - 11:00 PM, 11:00 PM - 7:00 am   pantoprazole  (PROTONIX ) 40 MG tablet Take 40 mg by mouth 2 (two) times daily.   Polyethyl Glycol-Propyl Glycol (SYSTANE) 0.4-0.3 % SOLN Apply 2 drops to eye every 6 (six) hours as needed (dry eyes).   polyethylene glycol powder (GLYCOLAX /MIRALAX ) 17 GM/SCOOP powder Take 17 g by mouth every other day.   potassium chloride  SA (KLOR-CON  M) 20 MEQ tablet Take 1 tablet (20 mEq total) by mouth daily.   sennosides-docusate sodium  (SENOKOT-S) 8.6-50 MG tablet Take 2 tablets by mouth 2 (two) times daily.   Tiotropium Bromide-Olodaterol (STIOLTO RESPIMAT ) 2.5-2.5 MCG/ACT AERS Inhale 2 puffs into the lungs daily.   torsemide  (DEMADEX ) 20 MG tablet Take 1 tablet (20 mg total) by mouth daily.   No facility-administered encounter medications on file as of 04/30/2024.    Review of Systems  Constitutional:  Negative for activity change, appetite change, chills, diaphoresis, fatigue, fever and unexpected weight change.  HENT:  Negative for congestion.   Respiratory:  Positive for shortness of breath. Negative for cough and wheezing.   Cardiovascular:  Negative for chest pain, palpitations and leg swelling.  Gastrointestinal:  Positive for constipation. Negative for abdominal distention, abdominal pain and diarrhea.  Genitourinary:  Negative for difficulty urinating and dysuria.  Musculoskeletal:  Positive for gait problem. Negative for arthralgias, back pain, joint swelling and myalgias.  Neurological:  Positive for facial asymmetry, speech difficulty and weakness. Negative for dizziness, tremors, seizures, syncope, light-headedness, numbness and headaches.  Psychiatric/Behavioral:  Negative  for agitation, behavioral problems and confusion.     Immunization History  Administered Date(s) Administered   Fluad Quad(high Dose 65+) 02/05/2022, 01/08/2023   Fluzone Influenza virus vaccine,trivalent (IIV3), split virus 04/30/2010, 01/26/2016, 02/07/2018, 02/01/2020   INFLUENZA, HIGH DOSE SEASONAL PF 02/07/2018, 01/25/2019, 02/28/2021, 02/05/2022, 01/13/2024   Influenza Split 01/16/2009   Influenza,inj,Quad PF,6+ Mos 03/06/2012, 01/26/2016, 01/10/2017   Influenza,inj,quad, With Preservative  02/14/2015   Moderna Covid-19 Fall Seasonal Vaccine 30yrs & older 01/13/2024   Moderna Covid-19 Vaccine Bivalent Booster 40yrs & up 02/28/2021, 02/04/2023   PFIZER(Purple Top)SARS-COV-2 Vaccination 05/06/2019, 05/27/2019, 01/27/2020, 09/18/2020   Pneumococcal Conjugate-13 03/19/2017   Pneumococcal Polysaccharide-23 02/14/2015   Respiratory Syncytial Virus Vaccine,Recomb Aduvanted(Arexvy) 02/15/2022   Td 02/25/2005   Tdap 05/19/2013   Zoster, Live 07/28/2006   Pertinent  Health Maintenance Due  Topic Date Due   Influenza Vaccine  Completed   Mammogram  Discontinued   Bone Density Scan  Discontinued      12/31/2022    3:26 PM 03/11/2023    2:48 PM 12/16/2023    3:28 PM 03/02/2024    3:02 PM 04/27/2024    2:26 PM  Fall Risk  Falls in the past year? 0 1 0 0 0  Was there an injury with Fall? 0  0  0  0  0  Fall Risk Category Calculator 0 1 0 0 0  Patient at Risk for Falls Due to History of fall(s);Impaired balance/gait;Impaired mobility History of fall(s);Impaired balance/gait;Impaired mobility History of fall(s);Impaired balance/gait;Impaired mobility Impaired balance/gait Impaired balance/gait  Fall risk Follow up Falls evaluation completed;Education provided;Falls prevention discussed Falls evaluation completed;Education provided;Falls prevention discussed Falls evaluation completed Falls evaluation completed Falls evaluation completed     Data saved with a previous flowsheet row definition    Functional Status Survey:    Vitals:   05/01/24 0753  BP: (!) 140/82  Pulse: 80  Resp: 20  Temp: 98 F (36.7 C)  SpO2: 91%  Weight: 86 lb (39 kg)   Body mass index is 14.31 kg/m. Physical Exam Vitals and nursing note reviewed.  Constitutional:      Appearance: Normal appearance.  Cardiovascular:     Rate and Rhythm: Normal rate. Rhythm irregular.     Heart sounds: No murmur heard. Pulmonary:     Effort: Pulmonary effort is normal.     Breath sounds: Wheezing and rales (faint to right base) present. No rhonchi.  Musculoskeletal:     Right lower leg: No edema.     Left lower leg: No edema.  Neurological:     Mental Status: She is alert. Mental status is at baseline.     Comments: Right sided weakness      Labs reviewed: Recent Labs    03/06/24 0134 04/15/24 2340 04/16/24 0615  NA 146* 143 144  K 3.6 4.1 4.3  CL 106 103 103  CO2 26 28 27   GLUCOSE 96 102* 123*  BUN 25* 31* 34*  CREATININE 0.80 1.24* 1.23*  CALCIUM  8.8* 8.9 8.5*   Recent Labs    06/24/23 0000 02/18/24 0900 04/15/24 2340  AST 30 22 27   ALT 30 17 13   ALKPHOS 95 64 75  BILITOT  --   --  0.4  PROT  --   --  6.9  ALBUMIN  3.0* 3.7 3.9   Recent Labs    02/18/24 0900 03/06/24 0134 04/15/24 2340 04/16/24 0615  WBC 7.1 7.1 9.2 7.6  NEUTROABS 4.30 4.5 6.8  --   HGB 13.5 12.5 13.9 13.1  HCT 42 40.1 44.3 41.4  MCV  --  95.5 96.5 95.4  PLT 234 238 270 269   Lab Results  Component Value Date   TSH 0.76 02/18/2024   Lab Results  Component Value Date   HGBA1C 6.0 (H) 11/13/2020   Lab Results  Component Value Date   CHOL 182 02/18/2024   HDL 78 (A) 02/18/2024  LDLCALC 85 02/18/2024   TRIG 96 02/18/2024   CHOLHDL 3.0 11/13/2020    Significant Diagnostic Results in last 30 days:  CT Chest Wo Contrast Result Date: 04/24/2024 EXAM: CT CHEST WITHOUT CONTRAST 04/12/2024 01:56:44 PM TECHNIQUE: CT of the chest was performed without the administration of intravenous contrast.  Multiplanar reformatted images are provided for review. Automated exposure control, iterative reconstruction, and/or weight based adjustment of the mA/kV was utilized to reduce the radiation dose to as low as reasonably achievable. COMPARISON: None available. CLINICAL HISTORY: Right lung opacity. FINDINGS: MEDIASTINUM: Heart: Cardiomegaly. 4-vessel coronary artery calcification. Pericardium is unremarkable. The central airways are clear. Vessels: The main pulmonary artery is enlarged in caliber measuring up to 4 cm with suggestion of pulmonary hypertension. The ascending thoracic aorta is stable in size and aneurysmal measuring up to 4.3 cm. The descending thoracic aorta may be normal in caliber. There is severe atherosclerotic plaque. Esophagus: Patulous esophagus. Thyroid : Unremarkable. LYMPH NODES: No gross hilar adenopathy with limited evaluation on this noncontrast study. Stable to slightly increased in size 1.9 cm precarinal lymph node. LUNGS AND PLEURA: Slightly improved right lower lobe peribronchial consolidation in the setting of bronchiectasis. Severe emphysematous changes. No pulmonary edema. No pleural effusion or pneumothorax. SOFT TISSUES/BONES: Soft tissues: Bilateral calcified breast implants. Bones: Degenerative changes of the bilateral shoulders. Diffusely decreased bone density. Multilevel vertebral body height loss of the visualized thoracolumbar spine as well as chronic compression fractures with exaggerated kyphotic curvature. These appear grossly stable. UPPER ABDOMEN: Limited images of the upper abdomen demonstrates no acute abnormality. IMPRESSION: 1. Slightly improved right lower lobe peribronchial consolidation in the setting of bronchiectasis. 2. Stable to slightly increased 1.9 cm precarinal lymph node.No gross hilar adenopathy with limited evaluation on this noncontrast study. 3. Severe emphysema; pulmonary emphysema is an independent risk factor for lung cancer, and recommend  consideration for evaluation for a low-dose CT lung cancer screening program. 4. Enlarged main pulmonary artery, suggesting pulmonary hypertension. 5. 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 6. Cardiomegaly. 7. Severe atherosclerotic plaque with 4-vessel coronary artery calcification. Electronically signed by: Morgane Naveau MD MD 04/24/2024 02:39 AM EST RP Workstation: [id number]   CT ABDOMEN PELVIS W WO CONTRAST Result Date: 04/16/2024 CLINICAL DATA:  Gross hematuria. EXAM: CT ABDOMEN AND PELVIS WITHOUT AND WITH CONTRAST TECHNIQUE: Multidetector CT imaging of the abdomen and pelvis was performed following the standard protocol before and following the bolus administration of intravenous contrast. RADIATION DOSE REDUCTION: This exam was performed according to the departmental dose-optimization program which includes automated exposure control, adjustment of the mA and/or kV according to patient size and/or use of iterative reconstruction technique. CONTRAST:  100mL ISOVUE -300 IOPAMIDOL  (ISOVUE -300) INJECTION 61% COMPARISON:  11/25/2020 FINDINGS: Lower Chest: No acute findings. Hepatobiliary: No suspicious hepatic masses identified. Layering sludge or tiny gallstones seen, without No evidence of cholecystitis or biliary ductal dilatation. Pancreas:  No mass or inflammatory changes. Spleen: Within normal limits in size and appearance. Adrenals/Urinary Tract: No adrenal masses identified. Tiny bilateral renal calculi as well as vascular calcifications again noted. A 5 mm calculus is also seen along the right posterior bladder wall. No evidence of ureteral calculi or hydronephrosis. Right upper pole renal parenchymal scarring noted. No suspicious renal masses identified. No masses seen involving the collecting systems, ureters, or bladder. Stomach/Bowel: No evidence of obstruction, inflammatory process or abnormal fluid collections. Sigmoid diverticulosis  noted, without signs of diverticulitis. Large stool burden seen throughout the colon. Vascular/Lymphatic: No pathologically  enlarged lymph nodes. No acute vascular findings. Severe aortic iliac atherosclerotic calcification again noted. Reproductive:  No mass or other significant abnormality. Other:  None. Musculoskeletal: No suspicious bone lesions identified. Old vertebral body compression fractures are again seen at T11, L1, and L3. Prior fusion noted at L4-5. IMPRESSION: Small nonobstructing bilateral renal calculi and small bladder calculus. No evidence of ureteral calculi or hydronephrosis. No radiographic evidence of urinary tract neoplasm. Cholelithiasis versus gallbladder sludge. No radiographic evidence of cholecystitis. Colonic diverticulosis, without radiographic evidence of diverticulitis. Large stool burden noted; recommend clinical correlation for possible constipation. Electronically Signed   By: Norleen DELENA Kil M.D.   On: 04/16/2024 21:59   ECHOCARDIOGRAM COMPLETE Result Date: 04/16/2024    ECHOCARDIOGRAM REPORT   Patient Name:   KELLY RANIERI Savell Date of Exam: 04/16/2024 Medical Rec #:  [id number]       Height:       65.0 in Accession #:    [id number]      Weight:       91.0 lb Date of Birth:  22-Jul-1945       BSA:          1.416 m Patient Age:    78 years        BP:           116/67 mmHg Patient Gender: F               HR:           75 bpm. Exam Location:  Inpatient Procedure: 2D Echo, Color Doppler and Cardiac Doppler (Both Spectral and Color            Flow Doppler were utilized during procedure). Indications:    CHF [id number]  History:        Patient has prior history of Echocardiogram examinations, most                 recent 07/08/2021. Risk Factors:Hypertension.  Sonographer:    Nathanel Devonshire Referring Phys: [id number] JAN A MANSY IMPRESSIONS  1. Left ventricular ejection fraction, by estimation, is 70 to 75%. The left ventricle has hyperdynamic function. The left ventricle has no regional wall motion  abnormalities. There is moderate concentric left ventricular hypertrophy. Left ventricular diastolic parameters are consistent with Grade I diastolic dysfunction (impaired relaxation).  2. Right ventricular systolic function is normal. The right ventricular size is normal. There is normal pulmonary artery systolic pressure.  3. Left atrial size was mildly dilated.  4. The mitral valve is normal in structure. No evidence of mitral valve regurgitation. No evidence of mitral stenosis.  5. The aortic valve is tricuspid. Aortic valve regurgitation is not visualized. No aortic stenosis is present.  6. The inferior vena cava is normal in size with greater than 50% respiratory variability, suggesting right atrial pressure of 3 mmHg. FINDINGS  Left Ventricle: Left ventricular ejection fraction, by estimation, is 70 to 75%. The left ventricle has hyperdynamic function. The left ventricle has no regional wall motion abnormalities. The left ventricular internal cavity size was normal in size. There is moderate concentric left ventricular hypertrophy. Left ventricular diastolic parameters are consistent with Grade I diastolic dysfunction (impaired relaxation). Right Ventricle: The right ventricular size is normal. No increase in right ventricular wall thickness. Right ventricular systolic function is normal. There is normal pulmonary artery systolic pressure. The tricuspid regurgitant velocity is 1.91 m/s, and  with an assumed right atrial pressure of 3 mmHg, the estimated right ventricular systolic pressure is 17.6 mmHg.  Left Atrium: Left atrial size was mildly dilated. Right Atrium: Right atrial size was normal in size. Pericardium: There is no evidence of pericardial effusion. Mitral Valve: The mitral valve is normal in structure. No evidence of mitral valve regurgitation. No evidence of mitral valve stenosis. MV peak gradient, 6.2 mmHg. The mean mitral valve gradient is 2.0 mmHg. Tricuspid Valve: The tricuspid valve is  normal in structure. Tricuspid valve regurgitation is mild . No evidence of tricuspid stenosis. Aortic Valve: The aortic valve is tricuspid. Aortic valve regurgitation is not visualized. No aortic stenosis is present. Aortic valve mean gradient measures 6.0 mmHg. Aortic valve peak gradient measures 12.5 mmHg. Aortic valve area, by VTI measures 3.01  cm. Pulmonic Valve: The pulmonic valve was normal in structure. Pulmonic valve regurgitation is not visualized. No evidence of pulmonic stenosis. Aorta: The aortic root is normal in size and structure. Venous: The inferior vena cava is normal in size with greater than 50% respiratory variability, suggesting right atrial pressure of 3 mmHg. IAS/Shunts: The interatrial septum was not well visualized.  LEFT VENTRICLE PLAX 2D LVIDd:         3.80 cm LVIDs:         2.60 cm LV PW:         1.00 cm LV IVS:        0.80 cm LVOT diam:     1.90 cm LV SV:         83 LV SV Index:   59 LVOT Area:     2.84 cm LV IVRT:       71 msec  LV Volumes (MOD) LV vol d, MOD A2C: 38.2 ml LV vol d, MOD A4C: 41.1 ml LV vol s, MOD A2C: 13.0 ml LV vol s, MOD A4C: 16.3 ml LV SV MOD A2C:     25.2 ml LV SV MOD A4C:     41.1 ml LV SV MOD BP:      25.4 ml RIGHT VENTRICLE             IVC RV Basal diam:  2.40 cm     IVC diam: 2.00 cm RV S prime:     10.80 cm/s TAPSE (M-mode): 1.7 cm LEFT ATRIUM             Index LA diam:        3.30 cm 2.33 cm/m LA Vol (A2C):   37.4 ml 26.42 ml/m LA Vol (A4C):   50.8 ml 35.88 ml/m LA Biplane Vol: 47.0 ml 33.20 ml/m  AORTIC VALVE                     PULMONIC VALVE AV Area (Vmax):    3.11 cm      PV Vmax:       1.02 m/s AV Area (Vmean):   2.91 cm      PV Peak grad:  4.2 mmHg AV Area (VTI):     3.01 cm AV Vmax:           177.00 cm/s AV Vmean:          107.000 cm/s AV VTI:            0.276 m AV Peak Grad:      12.5 mmHg AV Mean Grad:      6.0 mmHg LVOT Vmax:         194.00 cm/s LVOT Vmean:        110.000 cm/s LVOT VTI:  0.293 m LVOT/AV VTI ratio: 1.06  AORTA Ao  Root diam: 3.30 cm MITRAL VALVE             TRICUSPID VALVE MV Area VTI:  3.82 cm   TR Peak grad:   14.6 mmHg MV Peak grad: 6.2 mmHg   TR Vmax:        191.00 cm/s MV Mean grad: 2.0 mmHg MV Vmax:      1.24 m/s   SHUNTS MV Vmean:     64.8 cm/s  Systemic VTI:  0.29 m                          Systemic Diam: 1.90 cm Morene Brownie Electronically signed by Morene Brownie Signature Date/Time: 04/16/2024/4:22:27 PM    Final    DG Chest Port 1 View Result Date: 04/16/2024 EXAM: 1 VIEW(S) XRAY OF THE CHEST 04/16/2024 12:04:00 AM COMPARISON: 06/18/2023 CLINICAL HISTORY: SOB FINDINGS: LUNGS AND PLEURA: Eventration of the right hemidiaphragm again noted, better visualized on a CT examination of 04/12/2024. No focal pulmonary opacity. No pleural effusion. No pneumothorax. HEART AND MEDIASTINUM: Cardiomegaly. Tortuous, aneurysmal thoracic aorta, accentuated by semi-erect positioning. BONES AND SOFT TISSUES: Calcified right breast implant noted. No acute osseous abnormality. IMPRESSION: 1. Cardiomegaly. 2. 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 Electronically signed by: Dorethia Molt MD 04/16/2024 12:42 AM EST RP Workstation: [id number]    Assessment/Plan 1. SOB (shortness of breath) (Primary) Improved but continues to be more of an issue for her. She is wheezing and has some inflammatory changes on xray so we can try prednisone . Trying lower dose since she has been dealing with CHF.   - predniSONE  (DELTASONE ) 20 MG tablet; Take 1 tablet (20 mg total) by mouth daily with breakfast for 5 days.  2. Chronic obstructive pulmonary disease with emphysema, unspecified emphysema type (HCC) On stiolto Has an apt with pulmonary  Recommend xopenex  prn to avoid tachycardia.  3. Chronic respiratory failure with hypoxia (HCC) Now on 4 liters to maintain sats Goal can be 88% since she is retaining C02     Family/ staff Communication: discussed with her and her husband at bedside. She  is a DNR and declined ER visit. Still not ready to commit to no hospitalizations but would like to avoid  Labs/tests ordered:  NA    [1]  Allergies Allergen Reactions   Amoxicillin Other (See Comments)    UTI Has patient had a PCN reaction causing immediate rash, facial/tongue/throat swelling, SOB or lightheadedness with hypotension: No Has patient had a PCN reaction causing severe rash involving mucus membranes or skin necrosis: No Has patient had a PCN reaction that required hospitalization: No Has patient had a PCN reaction occurring within the last 10 years: Yes--UTI ONLY If all of the above answers are NO, then may proceed with Cephalosporin use.    Atenolol Cough   Crestor  [Rosuvastatin  Calcium ] Other (See Comments)    Muscle aches   Pravastatin  Other (See Comments)    Muscle aches   Sulfa Antibiotics Nausea Only   Codeine Other (See Comments)    hallucinations   "

## 2024-05-03 MED ORDER — ONABOTULINUMTOXINA 200 UNITS IJ SOLR: INTRAMUSCULAR | 2 refills | Status: DC

## 2024-05-03 NOTE — Telephone Encounter (Signed)
 Auth was approved, please send Botox  200u rx to [facility].  Auth#: [id number] (04/28/24-07/27/24)

## 2024-05-03 NOTE — Telephone Encounter (Signed)
 Rx sent to [facility]

## 2024-05-03 NOTE — Telephone Encounter (Addendum)
 X1 VM/LM return call

## 2024-05-03 NOTE — Addendum Note (Signed)
 Addended by: DOUGLASS DELON CROME on: 05/03/2024 04:11 PM   Modules accepted: Orders

## 2024-05-04 MED ORDER — ONABOTULINUMTOXINA 200 UNITS IJ SOLR: INTRAMUSCULAR | 2 refills | Status: AC

## 2024-05-04 NOTE — Addendum Note (Signed)
 Addended by: DOUGLASS DELON CROME on: 05/04/2024 09:43 AM   Modules accepted: Orders

## 2024-05-04 NOTE — Telephone Encounter (Signed)
 Copied from CRM #[id number]. Topic: Clinical - Lab/Test Results >> May 04, 2024  8:01 AM [name] wrote: Reason for CRM: CT results note relayed to patients spouse (DPR) per note from Dr. Shelah on 1/16 - verbalized understanding NFN.

## 2024-05-04 NOTE — Telephone Encounter (Signed)
 Rx has been updated with the correct directions.

## 2024-05-04 NOTE — Telephone Encounter (Signed)
 [facility] reached out regarding a typo on the rx stating every month. Can you please resend as every 3 months? Thank you!

## 2024-05-07 ENCOUNTER — Ambulatory Visit (HOSPITAL_COMMUNITY)
Admission: RE | Admit: 2024-05-07 | Discharge: 2024-05-07 | Disposition: A | Source: Ambulatory Visit | Attending: Internal Medicine | Admitting: Internal Medicine

## 2024-05-07 VITALS — BP 92/52 | HR 66 | Ht 65.0 in | Wt 92.0 lb

## 2024-05-07 DIAGNOSIS — Z79899 Other long term (current) drug therapy: Secondary | ICD-10-CM

## 2024-05-07 DIAGNOSIS — Z5181 Encounter for therapeutic drug level monitoring: Secondary | ICD-10-CM | POA: Diagnosis not present

## 2024-05-07 DIAGNOSIS — D6869 Other thrombophilia: Secondary | ICD-10-CM | POA: Diagnosis not present

## 2024-05-07 DIAGNOSIS — I4891 Unspecified atrial fibrillation: Secondary | ICD-10-CM | POA: Diagnosis not present

## 2024-05-07 DIAGNOSIS — I480 Paroxysmal atrial fibrillation: Secondary | ICD-10-CM

## 2024-05-07 MED ORDER — AMIODARONE HCL 200 MG PO TABS: 200.0000 mg | ORAL_TABLET | Freq: Every day | ORAL | 3 refills | Status: AC

## 2024-05-07 MED ORDER — POTASSIUM CHLORIDE CRYS ER 10 MEQ PO TBCR: 20.0000 meq | EXTENDED_RELEASE_TABLET | Freq: Every day | ORAL | 2 refills | Status: AC

## 2024-05-07 NOTE — Progress Notes (Signed)
 "   Primary Care Physician: [name], MD Primary Cardiologist: Oneil Parchment, MD Electrophysiologist: Elspeth Sage, MD (Inactive)     Referring Physician: Dr. Shlomo Cathlean KATHEE Beverly Li is a 79 y.o. female with a history of labile hypertension, hyperlipidemia, HOCM, CAD, CVA, and atrial fibrillation who presents for consultation in the [facility].  ED visit on 03/06/2024 for hypertension.  PCP at [facility] facility increased amlodipine  to 5 mg daily and hydralazine  to 20 mg 3 times daily.  Faxed ECG appears to show A-fib.  Patient is taking amiodarone  100 mg daily.  Patient is on Eliquis  for stroke prevention.  On evaluation today, patient is currently in junctional rhythm.  Since amlodipine  was increased, patient has noticed blood pressure readings have now been normal.  She has not needed to take hydralazine  yet.  No missed doses of Eliquis .  Follow-up 05/07/2024.  Patient is currently in junctional rhythm.  Most recent hospital admission around the new year for acute on chronic congestive heart failure; patient was noted to be in A-fib.  Nursing facility contacted office on 1/12 noting patient has shortness of breath with heart rate 120 bpm.  Amiodarone  was increased to 200 mg once daily.  No missed doses of Eliquis . Patient's husband notes she has difficulty swallowing potassium 20 meq tablet. She has tolerated the increase in amiodarone  without issue.  Today, she denies symptoms of palpitations, chest pain, shortness of breath, orthopnea, PND, lower extremity edema, dizziness, presyncope, syncope, bleeding, or neurologic sequela. The patient is tolerating medications without difficulties and is otherwise without complaint today.   she has a BMI of Body mass index is 15.31 kg/m.SABRA Filed Weights   05/07/24 1359  Weight: 41.7 kg     Current Outpatient Medications  Medication Sig Dispense Refill   acetaminophen  (TYLENOL ) 325 MG tablet Take 650 mg by  mouth every 4 (four) hours as needed. (Patient taking differently: Take 650 mg by mouth as needed.)     amiodarone  (PACERONE ) 200 MG tablet Take 1 tablet (200 mg total) by mouth daily.     amLODipine  (NORVASC ) 5 MG tablet Take 5 mg by mouth daily.     apixaban  (ELIQUIS ) 5 MG TABS tablet Take 5 mg by mouth 2 (two) times daily. May give crushed     Benzonatate  (TESSALON  PERLES PO) Take 200 mg by mouth 3 (three) times daily as needed (for cough). (Patient taking differently: Take 200 mg by mouth as needed (for cough).)     botulinum toxin Type A  (BOTOX ) 200 units injection Inject 155 units into the muscles of the head and neck every 3 months disregard the remainder 1 each 2   buPROPion  (WELLBUTRIN  XL) 150 MG 24 hr tablet Take 150 mg by mouth daily.     Calcium -Vitamin D -Vitamin K  (VIACTIV CALCIUM  PLUS D) 650-12.5-40 MG-MCG-MCG CHEW Chew 1 tablet by mouth daily.     Cholecalciferol  (VITAMIN D3) 20 MCG (800 UNIT) TABS Take 1 tablet by mouth daily.     cloNIDine  (CATAPRES ) 0.1 MG tablet Take 0.1 mg by mouth daily as needed (for sBP above 170). (Patient taking differently: Take 0.1 mg by mouth as needed (for sBP above 170).)     denosumab (PROLIA) 60 MG/ML SOSY injection Inject 60 mg into the skin every 6 (six) months.     Eyelid Cleansers (OCUSOFT EYELID CLEANSING) PADS Apply 1 Application topically daily.     ezetimibe  (ZETIA ) 10 MG tablet Take 10 mg by mouth daily. Oral  if crushed     hydrALAZINE  (APRESOLINE ) 10 MG tablet Take 10 mg by mouth daily as needed (for sBP above 170). (Patient taking differently: Take 10 mg by mouth as needed (for sBP above 170).)     hydrALAZINE  (APRESOLINE ) 10 MG tablet Take 2 tablets (20 mg total) by mouth 3 (three) times daily.     levalbuterol  (XOPENEX  HFA) 45 MCG/ACT inhaler Inhale 2 puffs into the lungs every 6 (six) hours as needed for wheezing.     levalbuterol  (XOPENEX ) 0.63 MG/3ML nebulizer solution Take 3 mLs (0.63 mg total) by nebulization every 6 (six) hours as  needed for wheezing or shortness of breath.     mirtazapine  (REMERON ) 7.5 MG tablet Take 1 tablet (7.5 mg total) by mouth at bedtime.     pantoprazole  (PROTONIX ) 40 MG tablet Take 40 mg by mouth 2 (two) times daily.     Polyethyl Glycol-Propyl Glycol (SYSTANE) 0.4-0.3 % SOLN Apply 2 drops to eye every 6 (six) hours as needed (dry eyes).     polyethylene glycol powder (GLYCOLAX /MIRALAX ) 17 GM/SCOOP powder Take 17 g by mouth every other day.     potassium chloride  SA (KLOR-CON  M) 20 MEQ tablet Take 1 tablet (20 mEq total) by mouth daily.     sennosides-docusate sodium  (SENOKOT-S) 8.6-50 MG tablet Take 2 tablets by mouth 2 (two) times daily. (Patient taking differently: Take 2 tablets by mouth as needed.)     Tiotropium Bromide-Olodaterol (STIOLTO RESPIMAT ) 2.5-2.5 MCG/ACT AERS Inhale 2 puffs into the lungs daily.     torsemide  (DEMADEX ) 20 MG tablet Take 1 tablet (20 mg total) by mouth daily. 30 tablet 0   OXYGEN  Inhale 3 L into the lungs as directed. to maintain sats > or equal to 90%. May titrate Three Times A Day; 07:00 AM - 03:00 PM, 03:00 PM - 11:00 PM, 11:00 PM - 7:00 am     No current facility-administered medications for this encounter.    Atrial Fibrillation Management history:  Previous antiarrhythmic drugs: Tikosyn , amiodarone  Previous cardioversions: None Previous ablations: None Anticoagulation history: Eliquis    ROS- All systems are reviewed and negative except as per the HPI above.  Physical Exam: BP (!) 92/52   Pulse 66   Ht 5' 5 (1.651 m)   Wt 41.7 kg Comment: Patient is not able to stand on the scale to weigh.  LMP  (LMP Unknown)   BMI 15.31 kg/m   GEN- The patient is well appearing, alert and oriented x 3 today.   Neck - no JVD or carotid bruit noted Lungs- Clear to ausculation bilaterally, normal work of breathing Heart- Regular rate and rhythm, no murmurs, rubs or gallops, PMI not laterally displaced Extremities- no clubbing, cyanosis, or edema Skin - no  rash or ecchymosis noted   EKG today demonstrates  EKG Interpretation Date/Time:  Friday May 07 2024 14:15:24 EST Ventricular Rate:  66 PR Interval:    QRS Duration:  156 QT Interval:  466 QTC Calculation: 488 R Axis:   -55  Text Interpretation: Junctional rhythm Left axis deviation Left ventricular hypertrophy with QRS widening and repolarization abnormality ( R in aVL , Cornell product , Romhilt-Estes ) Abnormal ECG When compared with ECG of 15-Apr-2024 23:44, PREVIOUS ECG IS PRESENT Confirmed by Terra Pac ([id number]) on 05/07/2024 2:22:34 PM     Echo 07/08/2021 demonstrated  1. Left ventricular ejection fraction, by estimation, is 60 to 65%. Left  ventricular ejection fraction by 3D volume is 65 %. The left ventricle has  normal function. The left ventricle has no regional wall motion  abnormalities. There is moderate  asymmetric left ventricular hypertrophy of the basal-septal segment. Left  ventricular diastolic parameters are consistent with Grade II diastolic  dysfunction (pseudonormalization). Elevated left atrial pressure.   2. Right ventricular systolic function is normal. The right ventricular  size is normal. There is normal pulmonary artery systolic pressure. The  estimated right ventricular systolic pressure is 21.8 mmHg.   3. Left atrial size was moderately dilated.   4. The mitral valve is degenerative. Trivial mitral valve regurgitation.  No evidence of mitral stenosis. Moderate mitral annular calcification.   5. The aortic valve is tricuspid. Aortic valve regurgitation is moderate.  Aortic valve sclerosis/calcification is present, without any evidence of  aortic stenosis.   6. Pulmonic valve regurgitation is mild to moderate.   7. The inferior vena cava is normal in size with greater than 50%  respiratory variability, suggesting right atrial pressure of 3 mmHg.   ASSESSMENT & PLAN CHA2DS2-VASc Score = 7  The patient's score is based upon: CHF History:  0 HTN History: 1 Diabetes History: 0 Stroke History: 2 Vascular Disease History: 1 Age Score: 2 Gender Score: 1       ASSESSMENT AND PLAN: Paroxysmal Atrial Fibrillation (ICD10:  I48.0) The patient's CHA2DS2-VASc score is 7, indicating a 11.2% annual risk of stroke.    Patient is currently in junctional rhythm.  We will continue with current antiarrhythmic drug therapy and maintain amiodarone  200 mg daily.  I will resend a prescription for potassium 10 mEq 2 tablets daily.  Recheck Bmet today to reassess potassium level.  High risk medication monitoring (ICD10: U5195107) Patient requires ongoing monitoring for anti-arrhythmic medication which has the potential to cause life threatening arrhythmias or AV block. Qtc stable. Continue amiodarone  200 mg daily.   Secondary Hypercoagulable State (ICD10:  D68.69) The patient is at significant risk for stroke/thromboembolism based upon her CHA2DS2-VASc Score of 7.  Continue Apixaban  (Eliquis ).  Continue Eliquis  5 mg twice daily.  Dosing is correct based on age below 57 years old and creatinine less than 1.5.  Hypertension She is hypotensive today but asymptomatic.  Discussion with patient's husband as to whether amlodipine  5 mg daily tablet should be lowered. He will discuss this with primary cardiologist.   Follow up as scheduled with Cardiology.   Terra Pac, [facility]  [facility] [address] [place], [place] [address] [phone]  "

## 2024-05-07 NOTE — Patient Instructions (Addendum)
 Continue amiodarone  200 mg once daily   Stop 20 meq potassium tablets    take Two -10 meq tabs once daily  for a total of 20 meq

## 2024-05-08 LAB — BASIC METABOLIC PANEL WITH GFR
BUN/Creatinine Ratio: 25 (ref 12–28)
BUN: 34 mg/dL — ABNORMAL HIGH (ref 8–27)
CO2: 30 mmol/L — ABNORMAL HIGH (ref 20–29)
Calcium: 9.6 mg/dL (ref 8.7–10.3)
Chloride: 95 mmol/L — ABNORMAL LOW (ref 96–106)
Creatinine, Ser: 1.34 mg/dL — ABNORMAL HIGH (ref 0.57–1.00)
Glucose: 125 mg/dL — ABNORMAL HIGH (ref 70–99)
Potassium: 4 mmol/L (ref 3.5–5.2)
Sodium: 143 mmol/L (ref 134–144)
eGFR: 40 mL/min/{1.73_m2} — ABNORMAL LOW

## 2024-05-11 ENCOUNTER — Ambulatory Visit (HOSPITAL_COMMUNITY): Payer: Self-pay | Admitting: Internal Medicine

## 2024-05-12 ENCOUNTER — Ambulatory Visit: Admitting: Neurology

## 2024-05-12 ENCOUNTER — Ambulatory Visit: Admitting: Emergency Medicine

## 2024-05-12 ENCOUNTER — Encounter: Payer: Self-pay | Admitting: Emergency Medicine

## 2024-05-12 VITALS — BP 92/62 | HR 90 | Ht 65.0 in | Wt 96.0 lb

## 2024-05-12 DIAGNOSIS — R918 Other nonspecific abnormal finding of lung field: Secondary | ICD-10-CM

## 2024-05-12 DIAGNOSIS — R9389 Abnormal findings on diagnostic imaging of other specified body structures: Secondary | ICD-10-CM

## 2024-05-12 DIAGNOSIS — J439 Emphysema, unspecified: Secondary | ICD-10-CM

## 2024-05-12 DIAGNOSIS — J449 Chronic obstructive pulmonary disease, unspecified: Secondary | ICD-10-CM | POA: Diagnosis not present

## 2024-05-12 NOTE — Progress Notes (Signed)
 "  Subjective:    Patient ID: Beverly Li, female    DOB: Jul 25, 1945, 79 y.o.   MRN: [id number]  HPI  ROV 04/01/2024 --79 year old woman whom I follow for severe COPD and associated chronic hypoxemic respiratory failure.  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  She has had a right lower lobe pneumonia and we have been following serial CT scans for an area of apparent rounded atelectasis.  Scan also significant for some panlobular emphysema and bronchiectasis. Currently managed on Stiolto, DuoNeb as needed, albuterol  as needed.  She was treated 12/11 for possible pneumonia.  This was associated with emesis, constitutional symptoms.  She had a COVID and flu swab that were negative at that time.  Started on doxycycline .  She is due for a CT chest on 12/29. She still feels very fatigued. No real cough or congestion. Her O2 was titrated up to 4L/min but now back down to 2-3L/min which is baseline. She does work with PT.   OTHO 05/12/2024 --follow-up visit 79 year old woman with a history of severe COPD and associated chronic hypoxemic respiratory failure, abnormal CT of the chest with emphysema, lymphadenopathy and an area of ovoid rounded opacity in the posterior right lower lobe.  Since I last saw her she was unfortunately admitted 04/15/2024 with an acute exacerbation of her chronic diastolic CHF. She was unable to do her PT for 2 weeks due the acute flare. She is restarting it now. She has been followed in A Fib clinic - question whether there would be benefit in changing albuterol  to xopenex .   CT chest 04/12/2024 reviewed by me showed no hilar adenopathy.  There is an overall stable 1.9 cm precarinal lymph node.  Some improvement in right lower lobe bronchiectasis and parabronchial consolidation.  Severe emphysema.  No new nodules or lesions.  TAA 4.3 cm.   Review of Systems   Constitutional:  Negative for fever and unexpected weight change.  HENT:  Negative for congestion, dental problem, ear pain, nosebleeds, postnasal drip, rhinorrhea, sinus pressure, sneezing, sore throat and trouble swallowing.   Eyes:  Negative for redness and itching.  Respiratory:  Positive for chest tightness and shortness of breath. Negative for cough and wheezing.   Cardiovascular:  Negative for palpitations and leg swelling.  Gastrointestinal:  Negative for nausea and vomiting.  Genitourinary:  Negative for dysuria.  Musculoskeletal:  Negative for joint swelling.  Skin:  Negative for rash.  Neurological:  Negative for headaches.  Hematological:  Does not bruise/bleed easily.  Psychiatric/Behavioral:  Negative for dysphoric mood. The patient is not nervous/anxious.        Objective:   Physical Exam Vitals:   05/12/24 1455  BP: 92/62  Pulse: 90  SpO2: 93%  Weight: 96 lb (43.5 kg)  Height: 5' 5 (1.651 m)      Gen: Pleasant, thin woman, in no distress,  normal affect, wheelchair  ENT: No lesions,  mouth clear,  oropharynx clear, no postnasal drip  Neck: No JVD, no stridor  Lungs: No use of accessory muscles, scattered rhonchi.  Crackles at both bases, no wheezing  Cardiovascular: RRR, heart sounds normal, no murmur or gallops, no peripheral edema  Musculoskeletal: No deformities, no cyanosis or clubbing  Neuro: awake, some dysarthria, subtle facial droop.   Skin: Warm, no lesions or rashes      Assessment & Plan:  Abnormal CT of the chest Rounded atelectasis and  scar at her right base are improved on serial imaging.  Reassured her about this.  She is still at risk for aspiration pneumonia and discussed aspiration precautions.  We can discuss timing of repeat imaging at her next visit, likely in about 1 year.  COPD (chronic obstructive pulmonary disease) (HCC) Please continue your Stiolto 2 puffs once daily. Agree with changing your albuterol  over to  levalbuterol  (Xopenex ): You can use 2 puffs or 1 nebulizer treatment up to every 6 hours to be needed for shortness of breath. Continue your oxygen  as you have been using it Continue to work with physical therapy.    I personally spent a total of 35 minutes in the care of the patient today including preparing to see the patient, getting/reviewing separately obtained history, performing a medically appropriate exam/evaluation, counseling and educating, documenting clinical information in the EHR, independently interpreting results, and communicating results.   Lamar Chris, MD, PhD 05/12/2024, 3:19 PM [facility] [phone] or if no answer [phone]  "

## 2024-05-12 NOTE — Assessment & Plan Note (Signed)
 Please continue your Stiolto 2 puffs once daily. Agree with changing your albuterol  over to levalbuterol  (Xopenex ): You can use 2 puffs or 1 nebulizer treatment up to every 6 hours to be needed for shortness of breath. Continue your oxygen  as you have been using it Continue to work with physical therapy.

## 2024-05-12 NOTE — Patient Instructions (Signed)
 We reviewed your CT scan of the chest today.  This is improved compared with your priors.  Less consolidation/scar in the right lower lobe.  This is good news. Please continue your Stiolto 2 puffs once daily. Agree with changing your albuterol  over to levalbuterol  (Xopenex ): You can use 2 puffs or 1 nebulizer treatment up to every 6 hours to be needed for shortness of breath. Continue your oxygen  as you have been using it Continue to work with physical therapy. We can discuss the timing of a repeat CT scan of the chest at your next visit.  Probably in about 1 year. Follow Dr. Shelah in 6 months, sooner if you have any problems.

## 2024-05-12 NOTE — Assessment & Plan Note (Signed)
 Rounded atelectasis and scar at her right base are improved on serial imaging.  Reassured her about this.  She is still at risk for aspiration pneumonia and discussed aspiration precautions.  We can discuss timing of repeat imaging at her next visit, likely in about 1 year.

## 2024-05-26 ENCOUNTER — Ambulatory Visit: Admitting: Neurology
